# Patient Record
Sex: Male | Born: 1950
Health system: Southern US, Community
[De-identification: ages and names within clinical notes are randomized; demographics above are authoritative.]

## PROBLEM LIST (undated history)

## (undated) DIAGNOSIS — F329 Major depressive disorder, single episode, unspecified: Secondary | ICD-10-CM

## (undated) DIAGNOSIS — N02B9 Other recurrent and persistent immunoglobulin A nephropathy: Secondary | ICD-10-CM

## (undated) DIAGNOSIS — N201 Calculus of ureter: Secondary | ICD-10-CM

## (undated) DIAGNOSIS — G4730 Sleep apnea, unspecified: Secondary | ICD-10-CM

## (undated) DIAGNOSIS — G4733 Obstructive sleep apnea (adult) (pediatric): Secondary | ICD-10-CM

## (undated) DIAGNOSIS — N289 Disorder of kidney and ureter, unspecified: Secondary | ICD-10-CM

## (undated) DIAGNOSIS — I951 Orthostatic hypotension: Secondary | ICD-10-CM

## (undated) DIAGNOSIS — E785 Hyperlipidemia, unspecified: Secondary | ICD-10-CM

## (undated) DIAGNOSIS — M810 Age-related osteoporosis without current pathological fracture: Secondary | ICD-10-CM

## (undated) DIAGNOSIS — N028 Recurrent and persistent hematuria with other morphologic changes: Secondary | ICD-10-CM

## (undated) DIAGNOSIS — F419 Anxiety disorder, unspecified: Secondary | ICD-10-CM

## (undated) DIAGNOSIS — H269 Unspecified cataract: Secondary | ICD-10-CM

## (undated) DIAGNOSIS — IMO0002 Reserved for concepts with insufficient information to code with codable children: Secondary | ICD-10-CM

## (undated) DIAGNOSIS — C801 Malignant (primary) neoplasm, unspecified: Secondary | ICD-10-CM

## (undated) DIAGNOSIS — G4700 Insomnia, unspecified: Secondary | ICD-10-CM

## (undated) DIAGNOSIS — I77810 Thoracic aortic ectasia: Secondary | ICD-10-CM

## (undated) DIAGNOSIS — E039 Hypothyroidism, unspecified: Secondary | ICD-10-CM

## (undated) DIAGNOSIS — I519 Heart disease, unspecified: Secondary | ICD-10-CM

## (undated) DIAGNOSIS — D649 Anemia, unspecified: Secondary | ICD-10-CM

## (undated) DIAGNOSIS — L719 Rosacea, unspecified: Secondary | ICD-10-CM

## (undated) DIAGNOSIS — F32A Depression, unspecified: Secondary | ICD-10-CM

## (undated) DIAGNOSIS — Z5189 Encounter for other specified aftercare: Secondary | ICD-10-CM

## (undated) DIAGNOSIS — I493 Ventricular premature depolarization: Secondary | ICD-10-CM

## (undated) DIAGNOSIS — I2510 Atherosclerotic heart disease of native coronary artery without angina pectoris: Secondary | ICD-10-CM

## (undated) HISTORY — DX: Rosacea, unspecified: L71.9

## (undated) HISTORY — DX: Heart disease, unspecified: I51.9

## (undated) HISTORY — DX: Thoracic aortic ectasia: I77.810

## (undated) HISTORY — DX: Ventricular premature depolarization: I49.3

## (undated) HISTORY — PX: CARDIAC CATHETERIZATION: SHX172

## (undated) HISTORY — DX: Atherosclerotic heart disease of native coronary artery without angina pectoris: I25.10

## (undated) HISTORY — DX: Insomnia, unspecified: G47.00

## (undated) HISTORY — DX: Obstructive sleep apnea (adult) (pediatric): G47.33

## (undated) HISTORY — DX: Malignant (primary) neoplasm, unspecified: C80.1

## (undated) HISTORY — PX: OTHER SURGICAL HISTORY: SHX169

## (undated) HISTORY — DX: Unspecified cataract: H26.9

## (undated) HISTORY — DX: Age-related osteoporosis without current pathological fracture: M81.0

## (undated) HISTORY — DX: Anxiety disorder, unspecified: F41.9

## (undated) HISTORY — DX: Hyperlipidemia, unspecified: E78.5

## (undated) HISTORY — DX: Encounter for other specified aftercare: Z51.89

## (undated) HISTORY — DX: Sleep apnea, unspecified: G47.30

## (undated) HISTORY — DX: Orthostatic hypotension: I95.1

## (undated) HISTORY — DX: Reserved for concepts with insufficient information to code with codable children: IMO0002

## (undated) NOTE — *Deleted (*Deleted)
VIRTUAL VISIT Due to national recommendations of social distancing due to [place] 19, a virtual visit is felt to be most appropriate for this patient at this time.   I connected with the patient on 01/23/20 at  2:40 PM EDT by virtual telehealth platform and verified that I am speaking with the correct person using two identifiers.   I discussed the limitations, risks, security and privacy concerns of performing an evaluation and management service by  virtual telehealth platform and the availability of in person appointments. I also discussed with the patient that there may be a patient responsible charge related to this service. The patient expressed understanding and agreed to proceed.  Patient location: Home Provider Location: [place] Participants: [name] and [name]   Chief Complaint  Patient presents with  . Follow-up    Pt stated--feeling much better but still chest pain when takin deep breath.    History of Present Illness:  34 year old male presents for hospital follow up.  COVID 19 screen No recent travel or known exposure to COVID19 The patient denies respiratory symptoms of COVID 19 at this time.  The importance of social distancing was discussed today.   ROS    Past Medical History:  Diagnosis Date  . Anemia   . Anxiety   . Asymptomatic LV dysfunction    50-55% by echo 06/2016  . Berger's disease   . CAD (coronary artery disease) 06/2005   PCI of LAD and RCA  . Depression   . Dilated aortic root (HCC)    4cm at sinus of Valsalva  . Hematuria   . Hyperlipidemia   . Hypothyroidism   . Insomnia   . Left ureteral stone   . Orthostatic hypotension    negative tilt table  . OSA (obstructive sleep apnea)    moderate with AHI 17/hr, uses CPAP nightly(01/15/19 - has Inspire implant)  . PVC's (premature ventricular contractions) 07/07/2016  . Renal disorder    PT IS SEEING DR. Lorrene Reid- NEPHROLOGIST  . Rosacea   . Urticaria     reports that he  has never smoked. He has never used smokeless tobacco. He reports current alcohol use of about 1.0 standard drink of alcohol per week. He reports that he does not use drugs.   Current Outpatient Medications:  .  albuterol (VENTOLIN HFA) 108 (90 Base) MCG/ACT inhaler, Inhale 2 puffs into the lungs every 4 (four) hours as needed for wheezing or shortness of breath., Disp: 1 each, Rfl: 0 .  alendronate (FOSAMAX) 70 MG tablet, TAKE 1 TABLET BY MOUTH EVERY 7 DAYS WITH A FULL GLASS OF WATER ON AN EMPTY STOMACH. (Patient taking differently: Take 70 mg by mouth once a week. ), Disp: 12 tablet, Rfl: 3 .  ascorbic acid (VITAMIN C) 500 MG tablet, Take 1 tablet (500 mg total) by mouth daily., Disp: 30 tablet, Rfl: 0 .  atorvastatin (LIPITOR) 40 MG tablet, Take 40 mg by mouth daily., Disp: , Rfl:  .  busPIRone (BUSPAR) 10 MG tablet, TAKE 2 TABLETS (20 MG TOTAL) BY MOUTH 3 (THREE) TIMES DAILY., Disp: 540 tablet, Rfl: 0 .  clopidogrel (PLAVIX) 75 MG tablet, TAKE 1 TABLET DAILY, Disp: 90 tablet, Rfl: 2 .  ferrous sulfate 325 (65 FE) MG tablet, TAKE 1 TABLET BY MOUTH 3 TIMES DAILY WITH MEALS. (Patient taking differently: Take 325 mg by mouth 3 (three) times daily with meals. ), Disp: 270 tablet, Rfl: 3 .  FLUoxetine (PROZAC) 20 MG capsule,  TAKE 1 CAPSULE DAILY. TAKE WITH 40MG  TO EQUAL 60MG     ONCE DAILY (Patient taking differently: Take 20 mg by mouth daily. TAKE 1 CAPSULE DAILY. TAKE WITH 40MG  TO EQUAL 60MG     ONCE DAILY), Disp: 90 capsule, Rfl: 1 .  FLUoxetine (PROZAC) 40 MG capsule, Take 1 capsule (40 mg total) by mouth every morning., Disp: 90 capsule, Rfl: 1 .  furosemide (LASIX) 20 MG tablet, Take 1 tablet (20 mg total) by mouth as needed (As needed for ankle swelling)., Disp: 90 tablet, Rfl: 3 .  gabapentin (NEURONTIN) 100 MG capsule, Take 2 capsules (200 mg total) by mouth 2 (two) times daily., Disp: 120 capsule, Rfl: 1 .  levothyroxine (SYNTHROID) 75 MCG tablet, TAKE 1 TABLET (75 MCG TOTAL) BY MOUTH DAILY  BEFORE BREAKFAST., Disp: 90 tablet, Rfl: 1 .  midodrine (PROAMATINE) 10 MG tablet, Take 1 tablet (10 mg total) by mouth 3 (three) times daily., Disp: 270 tablet, Rfl: 3 .  mirtazapine (REMERON) 15 MG tablet, TAKE 1 TABLET BY MOUTH EVERYDAY AT BEDTIME (Patient taking differently: Take 15 mg by mouth at bedtime. ), Disp: 90 tablet, Rfl: 1 .  nitroGLYCERIN (NITROSTAT) 0.4 MG SL tablet, Place 1 tablet (0.4 mg total) under the tongue every 5 (five) minutes as needed for chest pain., Disp: 10 tablet, Rfl: 1 .  pantoprazole (PROTONIX) 40 MG tablet, TAKE 1 TABLET BY MOUTH TWICE A DAY (Patient taking differently: Take 40 mg by mouth 2 (two) times daily. ), Disp: 180 tablet, Rfl: 1 .  tamsulosin (FLOMAX) 0.4 MG CAPS capsule, Take 0.4 mg by mouth at bedtime., Disp: , Rfl:  .  triamcinolone cream (KENALOG) 0.1 %, APPLY 1 APPLICATION TOPICALLY 2 (TWO) TIMES DAILY. AVOID FACE, GROIN, UNDERARMS., Disp: 454 g, Rfl: 0 .  vitamin B-12 (CYANOCOBALAMIN) 1000 MCG tablet, Take 1,000 mcg by mouth daily. , Disp: , Rfl:  .  zinc sulfate 220 (50 Zn) MG capsule, Take 1 capsule (220 mg total) by mouth daily., Disp: 30 capsule, Rfl: 0 .  zolpidem (AMBIEN) 5 MG tablet, Take 5 mg by mouth at bedtime. , Disp: , Rfl:    Observations/Objective: Blood pressure 95/60, height 6' (1.829 m), weight 175 lb (79.4 kg).  Physical Exam   Assessment and Plan    I discussed the assessment and treatment plan with the patient. The patient was provided an opportunity to ask questions and all were answered. The patient agreed with the plan and demonstrated an understanding of the instructions.   The patient was advised to call back or seek an in-person evaluation if the symptoms worsen or if the condition fails to improve as anticipated.     [name], MD

---

## 2002-04-11 HISTORY — PX: CORONARY ANGIOPLASTY: SHX604

## 2002-06-12 ENCOUNTER — Encounter: Payer: Self-pay | Admitting: Family Medicine

## 2002-06-12 ENCOUNTER — Encounter: Admission: RE | Admit: 2002-06-12 | Discharge: 2002-06-12 | Payer: Self-pay | Admitting: Family Medicine

## 2002-08-09 ENCOUNTER — Ambulatory Visit (HOSPITAL_COMMUNITY): Admission: RE | Admit: 2002-08-09 | Discharge: 2002-08-09 | Payer: Self-pay | Admitting: Gastroenterology

## 2004-04-04 ENCOUNTER — Emergency Department (HOSPITAL_COMMUNITY): Admission: EM | Admit: 2004-04-04 | Discharge: 2004-04-04 | Payer: Self-pay | Admitting: Emergency Medicine

## 2004-12-31 ENCOUNTER — Ambulatory Visit (HOSPITAL_COMMUNITY): Admission: RE | Admit: 2004-12-31 | Discharge: 2004-12-31 | Payer: Self-pay | Admitting: Cardiology

## 2005-02-01 ENCOUNTER — Ambulatory Visit (HOSPITAL_COMMUNITY): Admission: RE | Admit: 2005-02-01 | Discharge: 2005-02-02 | Payer: Self-pay | Admitting: Cardiology

## 2005-02-07 ENCOUNTER — Encounter: Admission: RE | Admit: 2005-02-07 | Discharge: 2005-02-07 | Payer: Self-pay | Admitting: Cardiology

## 2005-02-24 ENCOUNTER — Encounter (HOSPITAL_COMMUNITY): Admission: RE | Admit: 2005-02-24 | Discharge: 2005-05-25 | Payer: Self-pay | Admitting: Cardiology

## 2005-03-23 ENCOUNTER — Encounter: Admission: RE | Admit: 2005-03-23 | Discharge: 2005-03-23 | Payer: Self-pay | Admitting: Family Medicine

## 2005-05-26 ENCOUNTER — Encounter (HOSPITAL_COMMUNITY): Admission: RE | Admit: 2005-05-26 | Discharge: 2005-08-24 | Payer: Self-pay | Admitting: Cardiology

## 2005-06-09 DIAGNOSIS — I2510 Atherosclerotic heart disease of native coronary artery without angina pectoris: Secondary | ICD-10-CM

## 2005-06-09 HISTORY — DX: Atherosclerotic heart disease of native coronary artery without angina pectoris: I25.10

## 2005-06-17 ENCOUNTER — Ambulatory Visit (HOSPITAL_COMMUNITY): Admission: RE | Admit: 2005-06-17 | Discharge: 2005-06-18 | Payer: Self-pay | Admitting: Cardiology

## 2006-02-02 ENCOUNTER — Ambulatory Visit: Payer: Self-pay | Admitting: Family Medicine

## 2006-03-08 ENCOUNTER — Ambulatory Visit: Payer: Self-pay | Admitting: Family Medicine

## 2006-04-26 ENCOUNTER — Ambulatory Visit: Payer: Self-pay | Admitting: Family Medicine

## 2006-04-26 LAB — CONVERTED CEMR LAB
ALT: 23 units/L (ref 0–40)
AST: 35 units/L (ref 0–37)
Cholesterol: 133 mg/dL (ref 0–200)
Glucose, Bld: 103 mg/dL — ABNORMAL HIGH (ref 70–99)
HDL: 61.2 mg/dL (ref >39.0)
LDL Cholesterol: 63 mg/dL (ref 0–99)
PSA: 0.48 ng/mL (ref 0.10–4.00)
Total CHOL/HDL Ratio: 2.2
Triglycerides: 45 mg/dL (ref 0–149)
VLDL: 9 mg/dL (ref 0–40)

## 2006-04-27 ENCOUNTER — Encounter (INDEPENDENT_AMBULATORY_CARE_PROVIDER_SITE_OTHER): Payer: Self-pay | Admitting: Family Medicine

## 2006-05-15 ENCOUNTER — Ambulatory Visit: Payer: Self-pay | Admitting: Family Medicine

## 2006-05-16 ENCOUNTER — Encounter (INDEPENDENT_AMBULATORY_CARE_PROVIDER_SITE_OTHER): Payer: Self-pay | Admitting: Family Medicine

## 2006-05-16 LAB — CONVERTED CEMR LAB
Bilirubin Urine: NEGATIVE
Ketones, ur: NEGATIVE mg/dL
Leukocytes, UA: NEGATIVE
Nitrite: NEGATIVE
Protein, ur: NEGATIVE mg/dL
Specific Gravity, Urine: 1.022 (ref 1.005–1.03)
Urine Glucose: NEGATIVE mg/dL
Urobilinogen, UA: 0.2 (ref 0.0–1.0)
WBC, UA: NONE SEEN cells/hpf (ref <3)
pH: 5.5 (ref 5.0–8.0)

## 2006-06-19 ENCOUNTER — Ambulatory Visit: Payer: Self-pay | Admitting: Family Medicine

## 2006-06-19 LAB — CONVERTED CEMR LAB
Bilirubin Urine: NEGATIVE
Hemoglobin, Urine: NEGATIVE
Ketones, ur: NEGATIVE mg/dL
Leukocytes, UA: NEGATIVE
Nitrite: NEGATIVE
Protein, ur: NEGATIVE mg/dL
Specific Gravity, Urine: 1.013 (ref 1.005–1.03)
Urine Glucose: NEGATIVE mg/dL
Urobilinogen, UA: 0.2 (ref 0.0–1.0)
pH: 6 (ref 5.0–8.0)

## 2006-07-05 DIAGNOSIS — K219 Gastro-esophageal reflux disease without esophagitis: Secondary | ICD-10-CM | POA: Insufficient documentation

## 2006-07-05 DIAGNOSIS — F411 Generalized anxiety disorder: Secondary | ICD-10-CM | POA: Insufficient documentation

## 2006-07-05 DIAGNOSIS — E785 Hyperlipidemia, unspecified: Secondary | ICD-10-CM | POA: Insufficient documentation

## 2006-07-05 DIAGNOSIS — I2510 Atherosclerotic heart disease of native coronary artery without angina pectoris: Secondary | ICD-10-CM | POA: Insufficient documentation

## 2006-07-17 ENCOUNTER — Ambulatory Visit: Payer: Self-pay | Admitting: Family Medicine

## 2006-07-17 LAB — CONVERTED CEMR LAB: Glucose, Bld: 109 mg/dL — ABNORMAL HIGH (ref 70–99)

## 2006-10-18 ENCOUNTER — Encounter (INDEPENDENT_AMBULATORY_CARE_PROVIDER_SITE_OTHER): Payer: Self-pay | Admitting: Family Medicine

## 2006-12-14 ENCOUNTER — Telehealth (INDEPENDENT_AMBULATORY_CARE_PROVIDER_SITE_OTHER): Payer: Self-pay | Admitting: *Deleted

## 2007-01-29 ENCOUNTER — Encounter (INDEPENDENT_AMBULATORY_CARE_PROVIDER_SITE_OTHER): Payer: Self-pay | Admitting: Family Medicine

## 2007-03-01 ENCOUNTER — Ambulatory Visit: Payer: Self-pay | Admitting: Family Medicine

## 2007-03-04 LAB — CONVERTED CEMR LAB
ALT: 26 units/L (ref 0–53)
AST: 31 units/L (ref 0–37)
Cholesterol: 140 mg/dL (ref 0–200)
Glucose, Bld: 84 mg/dL (ref 70–99)
HDL: 54.9 mg/dL (ref >39.0)
LDL Cholesterol: 73 mg/dL (ref 0–99)
Total CHOL/HDL Ratio: 2.6
Triglycerides: 63 mg/dL (ref 0–149)
VLDL: 13 mg/dL (ref 0–40)

## 2007-03-05 ENCOUNTER — Telehealth (INDEPENDENT_AMBULATORY_CARE_PROVIDER_SITE_OTHER): Payer: Self-pay | Admitting: *Deleted

## 2007-03-05 ENCOUNTER — Encounter (INDEPENDENT_AMBULATORY_CARE_PROVIDER_SITE_OTHER): Payer: Self-pay | Admitting: *Deleted

## 2007-06-05 ENCOUNTER — Ambulatory Visit: Payer: Self-pay | Admitting: Family Medicine

## 2007-06-07 ENCOUNTER — Telehealth (INDEPENDENT_AMBULATORY_CARE_PROVIDER_SITE_OTHER): Payer: Self-pay | Admitting: *Deleted

## 2007-06-07 ENCOUNTER — Encounter (INDEPENDENT_AMBULATORY_CARE_PROVIDER_SITE_OTHER): Payer: Self-pay | Admitting: *Deleted

## 2007-06-07 LAB — CONVERTED CEMR LAB
ALT: 16 units/L (ref 0–53)
AST: 25 units/L (ref 0–37)
Cholesterol: 150 mg/dL (ref 0–200)
Glucose, Bld: 108 mg/dL — ABNORMAL HIGH (ref 70–99)
HDL: 57.2 mg/dL (ref >39.0)
LDL Cholesterol: 79 mg/dL (ref 0–99)
PSA: 0.62 ng/mL (ref 0.10–4.00)
Total CHOL/HDL Ratio: 2.6
Triglycerides: 69 mg/dL (ref 0–149)
VLDL: 14 mg/dL (ref 0–40)

## 2007-08-01 ENCOUNTER — Encounter: Payer: Self-pay | Admitting: Internal Medicine

## 2007-08-01 LAB — HM COLONOSCOPY: HM Colonoscopy: NORMAL

## 2007-09-12 ENCOUNTER — Ambulatory Visit: Payer: Self-pay | Admitting: Family Medicine

## 2007-09-12 DIAGNOSIS — Z8669 Personal history of other diseases of the nervous system and sense organs: Secondary | ICD-10-CM | POA: Insufficient documentation

## 2007-12-14 ENCOUNTER — Ambulatory Visit: Payer: Self-pay | Admitting: Family Medicine

## 2008-01-09 ENCOUNTER — Ambulatory Visit: Payer: Self-pay | Admitting: Family Medicine

## 2008-03-21 ENCOUNTER — Telehealth: Payer: Self-pay | Admitting: Family Medicine

## 2008-06-18 ENCOUNTER — Ambulatory Visit: Payer: Self-pay | Admitting: Family Medicine

## 2008-06-19 LAB — CONVERTED CEMR LAB
ALT: 22 units/L (ref 0–53)
AST: 30 units/L (ref 0–37)
Albumin: 4 g/dL (ref 3.5–5.2)
Alkaline Phosphatase: 65 units/L (ref 39–117)
BUN: 12 mg/dL (ref 6–23)
Basophils Absolute: 0 10*3/uL (ref 0.0–0.1)
Basophils Relative: 0.1 % (ref 0.0–3.0)
Bilirubin, Direct: 0.1 mg/dL (ref 0.0–0.3)
CO2: 28 meq/L (ref 19–32)
Calcium: 9.6 mg/dL (ref 8.4–10.5)
Chloride: 107 meq/L (ref 96–112)
Cholesterol: 144 mg/dL (ref 0–200)
Creatinine, Ser: 1.3 mg/dL (ref 0.4–1.5)
Creatinine,U: 247.1 mg/dL
Eosinophils Absolute: 0.2 10*3/uL (ref 0.0–0.7)
Eosinophils Relative: 4.6 % (ref 0.0–5.0)
GFR calc Af Amer: 73 mL/min
GFR calc non Af Amer: 60 mL/min
Glucose, Bld: 110 mg/dL — ABNORMAL HIGH (ref 70–99)
HCT: 43.2 % (ref 39.0–52.0)
HDL: 69.2 mg/dL (ref >39.0)
Hemoglobin: 14.8 g/dL (ref 13.0–17.0)
Hgb A1c MFr Bld: 5.8 % (ref 4.6–6.0)
LDL Cholesterol: 68 mg/dL (ref 0–99)
Lymphocytes Relative: 39 % (ref 12.0–46.0)
MCHC: 34.3 g/dL (ref 30.0–36.0)
MCV: 92.9 fL (ref 78.0–100.0)
Microalb Creat Ratio: 2 mg/g (ref 0.0–30.0)
Microalb, Ur: 0.5 mg/dL (ref 0.0–1.9)
Monocytes Absolute: 0.5 10*3/uL (ref 0.1–1.0)
Monocytes Relative: 9.6 % (ref 3.0–12.0)
Neutro Abs: 2.3 10*3/uL (ref 1.4–7.7)
Neutrophils Relative %: 46.7 % (ref 43.0–77.0)
PSA: 0.43 ng/mL (ref 0.10–4.00)
Platelets: 213 10*3/uL (ref 150–400)
Potassium: 5.2 meq/L — ABNORMAL HIGH (ref 3.5–5.1)
RBC: 4.64 M/uL (ref 4.22–5.81)
RDW: 13.6 % (ref 11.5–14.6)
Sodium: 140 meq/L (ref 135–145)
TSH: 3.69 microintl units/mL (ref 0.35–5.50)
Total Bilirubin: 1 mg/dL (ref 0.3–1.2)
Total CHOL/HDL Ratio: 2.1
Total Protein: 6.4 g/dL (ref 6.0–8.3)
Triglycerides: 33 mg/dL (ref 0–149)
VLDL: 7 mg/dL (ref 0–40)
WBC: 4.9 10*3/uL (ref 4.5–10.5)

## 2008-06-24 ENCOUNTER — Ambulatory Visit: Payer: Self-pay | Admitting: Family Medicine

## 2008-06-24 DIAGNOSIS — R7309 Other abnormal glucose: Secondary | ICD-10-CM

## 2008-08-01 ENCOUNTER — Ambulatory Visit: Payer: Self-pay | Admitting: Family Medicine

## 2008-09-10 ENCOUNTER — Ambulatory Visit: Payer: Self-pay | Admitting: Family Medicine

## 2008-10-08 ENCOUNTER — Telehealth: Payer: Self-pay | Admitting: Family Medicine

## 2008-10-14 ENCOUNTER — Telehealth: Payer: Self-pay | Admitting: Family Medicine

## 2008-12-24 ENCOUNTER — Encounter: Payer: Self-pay | Admitting: Family Medicine

## 2009-01-29 ENCOUNTER — Telehealth: Payer: Self-pay | Admitting: Family Medicine

## 2009-06-18 ENCOUNTER — Encounter: Payer: Self-pay | Admitting: Family Medicine

## 2009-06-22 ENCOUNTER — Encounter (INDEPENDENT_AMBULATORY_CARE_PROVIDER_SITE_OTHER): Payer: Self-pay | Admitting: *Deleted

## 2009-06-22 LAB — CONVERTED CEMR LAB
ALT: 90 units/L
AST: 34 units/L
Alkaline Phosphatase: 60 units/L
Creatinine, Ser: 1.3 mg/dL
Direct LDL: 57 mg/dL
Glucose, Bld: 109 mg/dL
Potassium: 4.6 meq/L
Sodium: 142 meq/L
Triglycerides: 39 mg/dL

## 2009-06-24 ENCOUNTER — Ambulatory Visit: Payer: Self-pay | Admitting: Family Medicine

## 2009-09-18 ENCOUNTER — Ambulatory Visit: Payer: Self-pay | Admitting: Family Medicine

## 2009-09-18 LAB — CONVERTED CEMR LAB
ALT: 23 units/L (ref 0–53)
AST: 31 units/L (ref 0–37)
Albumin: 3.9 g/dL (ref 3.5–5.2)
Alkaline Phosphatase: 55 units/L (ref 39–117)
BUN: 24 mg/dL — ABNORMAL HIGH (ref 6–23)
Bilirubin, Direct: 0.1 mg/dL (ref 0.0–0.3)
CO2: 29 meq/L (ref 19–32)
Calcium: 8.9 mg/dL (ref 8.4–10.5)
Chloride: 106 meq/L (ref 96–112)
Cholesterol: 147 mg/dL (ref 0–200)
Creatinine, Ser: 1.1 mg/dL (ref 0.4–1.5)
GFR calc non Af Amer: 75.94 mL/min (ref >60)
Glucose, Bld: 96 mg/dL (ref 70–99)
HDL: 61.7 mg/dL (ref >39.00)
LDL Cholesterol: 83 mg/dL (ref 0–99)
PSA: 0.44 ng/mL (ref 0.10–4.00)
Potassium: 5 meq/L (ref 3.5–5.1)
Sodium: 141 meq/L (ref 135–145)
Total Bilirubin: 0.7 mg/dL (ref 0.3–1.2)
Total CHOL/HDL Ratio: 2
Total Protein: 5.7 g/dL — ABNORMAL LOW (ref 6.0–8.3)
Triglycerides: 10 mg/dL (ref 0.0–149.0)
VLDL: 2 mg/dL (ref 0.0–40.0)

## 2009-09-22 ENCOUNTER — Ambulatory Visit: Payer: Self-pay | Admitting: Family Medicine

## 2009-09-22 LAB — CONVERTED CEMR LAB
Cholesterol, target level: 200 mg/dL
HDL goal, serum: 40 mg/dL
LDL Goal: 70 mg/dL

## 2009-10-14 ENCOUNTER — Ambulatory Visit: Payer: Self-pay | Admitting: Family Medicine

## 2009-10-14 DIAGNOSIS — H6980 Other specified disorders of Eustachian tube, unspecified ear: Secondary | ICD-10-CM

## 2010-01-07 ENCOUNTER — Encounter (INDEPENDENT_AMBULATORY_CARE_PROVIDER_SITE_OTHER): Payer: Self-pay | Admitting: *Deleted

## 2010-01-21 ENCOUNTER — Ambulatory Visit: Payer: Self-pay | Admitting: Family Medicine

## 2010-01-21 DIAGNOSIS — R319 Hematuria, unspecified: Secondary | ICD-10-CM | POA: Insufficient documentation

## 2010-01-21 LAB — CONVERTED CEMR LAB
Bilirubin Urine: NEGATIVE
Glucose, Urine, Semiquant: NEGATIVE
Ketones, urine, test strip: NEGATIVE
Nitrite: NEGATIVE
Protein, U semiquant: 30
Specific Gravity, Urine: 1.02
Urobilinogen, UA: 0.2
pH: 6

## 2010-01-22 ENCOUNTER — Encounter: Payer: Self-pay | Admitting: Family Medicine

## 2010-01-25 LAB — CONVERTED CEMR LAB
Bacteria, UA: NONE SEEN
Casts: NONE SEEN /lpf
PSA: 0.53 ng/mL (ref 0.10–4.00)
RBC / HPF: >50 — AB (ref <3)
Squamous Epithelial / HPF: NONE SEEN /lpf

## 2010-02-03 ENCOUNTER — Encounter: Payer: Self-pay | Admitting: Family Medicine

## 2010-02-22 ENCOUNTER — Encounter: Payer: Self-pay | Admitting: Family Medicine

## 2010-03-29 ENCOUNTER — Encounter: Payer: Self-pay | Admitting: Family Medicine

## 2010-05-02 ENCOUNTER — Encounter: Payer: Self-pay | Admitting: Interventional Cardiology

## 2010-05-13 NOTE — Assessment & Plan Note (Signed)
Summary: PAIN  UNDER RIB CAGE/DLO   Vital Signs:  Patient profile:   60 year old male Weight:      204.75 pounds BMI:     27.87 Temp:     97.8 degrees F oral Pulse rate:   76 / minute Pulse rhythm:   regular BP sitting:   118 / 70  (left arm) Cuff size:   regular  Vitals Entered By: Emelia Salisbury LPN (March 16, 624THL [id number] PM) CC: Pain in left upper back X 1 1/2 weeks   History of Present Illness: Pain in posterior left lateral scapular area. He has had this for one week or more. He has taken IBP 2 tabs by mouth three times a day and used heat. He denies any unusual activity. He has had no breathing problems with this. He runs and walks a lot. Jogging hurts worse.  He denies inactivity. No fever or chills, no coiugh and no SOB. Laying on the floor and getting up hurts. Laying on stomach does not hurt. He has noticed no swelling or bruising. He has no sweating with it. Twisting the upper torso hurts to the left, end twist to the right hurts mildly.  Problems Prior to Update: 1)  Prediabetes  (ICD-790.29) 2)  Special Screening Malignant Neoplasm of Prostate  (ICD-V76.44) 3)  Cough  (ICD-786.2) 4)  Family History of Colon Ca 1st Degree Relative <60  (ICD-V16.0) 5)  Syncope, Hx of  (ICD-V12.49) 6)  Preventive Health Care  (ICD-V70.0) 7)  Screening For Diabetes Mellitus  (ICD-V77.1) 8)  Hyperlipidemia  (ICD-272.4) 9)  Gerd  (ICD-530.81) 10)  Coronary Artery Disease  (ICD-414.00) 11)  Anxiety  (ICD-300.00)  Medications Prior to Update: 1)  Plavix 75 Mg Tabs (Clopidogrel Bisulfate) .... Take 1 Tablet By Mouth Once A Day 2)  Vytorin 10-20 Mg Tabs (Ezetimibe-Simvastatin) .... Take 1 Tablet By Mouth Once A Day 3)  Doxepin Hcl 100 Mg Caps (Doxepin Hcl) .[name].. 1 Tab By Mouth By Mouth At Bedtime 4)  Hydroxyzine Hcl 25 Mg  Tabs (Hydroxyzine Hcl) .... Take 2 Tablets By Mouth At Bedtime 5)  Fexofenadine Hcl 180 Mg  Tabs (Fexofenadine Hcl) .... Take 1 Tablet By Mouth Once A Day 6)  Xyzal 5 Mg  Tabs  (Levocetirizine Dihydrochloride) .... Take 1 Tablet By Mouth Once A Day 7)  Aspirin 325 Mg  Tabs (Aspirin) .... Take 1 Tablet By Mouth Once A Day 8)  Effexor Xr 150 Mg Xr24h-Cap (Venlafaxine Hcl) .[name].. 1 Tab By Mouth Daily  Allergies: 1)  ! Codeine  Physical Exam  General:  Well-developed,well-nourished,in no acute distress; alert,appropriate and cooperative throughout examination, comfortable and nontoxic. Head:  Normocephalic and atraumatic without obvious abnormalities. No apparent alopecia or balding. Eyes:  Conjunctiva clear bilaterally.  Ears:  External ear exam shows no significant lesions or deformities.  Otoscopic examination reveals clear canals, tympanic membranes are intact bilaterally without bulging, retraction, inflammation or discharge. Hearing is grossly normal bilaterally. Nose:  External nasal examination shows no deformity or inflammation. Nasal mucosa are pink and moist without lesions or exudates. Mouth:  Oral mucosa and oropharynx without lesions or exudates.  Teeth in good repair. Neck:  no carotid bruit or thyromegaly  Lungs:  Normal respiratory effort, chest expands symmetrically. Lungs are clear to auscultation, no crackles or wheezes. No SOB, access muscle use or dyspnea. Heart:  Normal rate and regular rhythm. S1 and S2 normal without gallop, murmur, click, rub or other extra sounds. Msk:  Pain principally in the  left Rhomboid, L Upper Lat area. I cannot provoke pain w/ palpation but can with certain maneuvers, esp his own twisdting movements.   Impression & Recommendations:  Problem # 1:  BACK STRAIN, L RHOMBOID (V9791527.9) Assessment New See instructions. RTC if worsens. Stop both IBP and Flex as soon as able.  Complete Medication List: 1)  Plavix 75 Mg Tabs (Clopidogrel bisulfate) .... Take 1 tablet by mouth once a day 2)  Vytorin 10-20 Mg Tabs (Ezetimibe-simvastatin) .... Take 1 tablet by mouth once a day 3)  Doxepin Hcl 100 Mg Caps (Doxepin hcl) .[name].. 1  tab by mouth by mouth at bedtime 4)  Hydroxyzine Hcl 25 Mg Tabs (Hydroxyzine hcl) .... Take 2 tablets by mouth at bedtime 5)  Fexofenadine Hcl 180 Mg Tabs (Fexofenadine hcl) .... Take 1 tablet by mouth once a day 6)  Xyzal 5 Mg Tabs (Levocetirizine dihydrochloride) .... Take 1 tablet by mouth once a day 7)  Aspirin 325 Mg Tabs (Aspirin) .... Take 1 tablet by mouth once a day 8)  Effexor Xr 150 Mg Xr24h-cap (Venlafaxine hcl) .[name].. 1 tab by mouth daily 9)  Ibuprofen 200 Mg Tabs (Ibuprofen) .... As needed 10)  Ibuprofen 800 Mg Tabs (Ibuprofen) .... Oine tab after each meal. 11)  Cyclobenzaprine Hcl 10 Mg Tabs (Cyclobenzaprine hcl) .... One tab by mouth three times a day  Patient Instructions: 1)  Take IBP three times a day with food. 2)  Take Flexeril for muscle relaxation three times a day. 3)  Heat and ice as directed.  Prescriptions: CYCLOBENZAPRINE HCL 10 MG TABS (CYCLOBENZAPRINE HCL) one tab by mouth three times a day  #50 x 0   Entered and Authorized by:   Raenette Rover MD   Signed by:   Raenette Rover MD on 06/24/2009   Method used:   Electronically to        [id number] (retail)       [place], [place]  [address]       Ph: [phone]       Fax: [phone]   RxID:   [id number] IBUPROFEN 800 MG TABS (IBUPROFEN) oine tab after each meal.  #90 x 0   Entered and Authorized by:   Raenette Rover MD   Signed by:   Raenette Rover MD on 06/24/2009   Method used:   Electronically to        [id number]* (retail)       [place], [place]  [address]       Ph: [phone]       Fax: [phone]   RxID:   [id number]   Current Allergies (reviewed today): ! CODEINE

## 2010-05-13 NOTE — Consult Note (Signed)
Summary: [facility]  [facility]   Imported By: Bubba Hales 03/03/2010 12:29:31  _____________________________________________________________________  External Attachment:    Type:   Image     Comment:   External Document  Appended Document: [facility]    Clinical Lists Changes

## 2010-05-13 NOTE — Assessment & Plan Note (Signed)
Summary: blood in urine/alc   Vital Signs:  Patient profile:   60 year old male Height:      72 inches Weight:      186.75 pounds BMI:     25.42 Temp:     98.2 degrees F oral Pulse rate:   76 / minute Pulse rhythm:   regular BP sitting:   120 / 82 Cuff size:   regular  Vitals Entered ByLacretia Nicks (January 21, 2010 11:25 AM) CC: blood in urine    History of Present Illness: "Blood" = cola colored- in urine for last 3-4 weeks.  Noted in AM.  Exercising in AM.  Doesn't see change before exercise.  Normal urine before exercise.  Change in urine gradually gets better as the day goes one.  On ASA and plavix.  PSA wnl in 6/11.  No pain, discharge, burning.  Never smoked.  Jogging 36 miles a week on treadmill.   Allergies: 1)  ! Codeine  Past History:  Past Medical History: Last updated: 09/12/2007 Anxiety Coronary artery disease GERD Hyperlipidemia-272.4 Urticaria Rosacea Insomnia  DERM Dr. Rosaria Ferries CARDS  Dr. Radford Pax  Social History: Last updated: 09/12/2007 Occupation: retired, [employer] Married  Alcohol use-yes, 2 drinks a week Drug use-no Regular exercise-yes, jog 5 miles 6 days a week Never Smoked  Review of Systems       See HPI.  Otherwise negative.    Physical Exam  General:  GEN: nad, alert and oriented HEENT: mucous membranes moist CV: rrr.  PULM: ctab, no inc wob ABD: soft, +bs EXT: no edema SKIN: no acute rash  Prostate not tender to palpation and w/o nodularity   Impression & Recommendations:  Problem # 1:  HEMATURIA UNSPECIFIED (ICD-599.70) Likely runner's hematuria but given the new onset, I would like uro input for consideration of cystoscopy.  I told patient to cont the ASA/plavix in the meantime.  See instrutions.  Will contact with labs.  Orders: Specimen Handling ([id number]) T-Culture, Urine [id number]) T-Urine Microscopic [id number]) Urology Referral (Urology) TLB-PSA (Prostate Specific Antigen) (84153-PSA)  Complete  Medication List: 1)  Plavix 75 Mg Tabs (Clopidogrel bisulfate) .... Take 1 tablet by mouth once a day 2)  Simvastatin 80 Mg Tabs (Simvastatin) .[name].. 1 tab by mouth daily 3)  Doxepin Hcl 100 Mg Caps (Doxepin hcl) .[name].. 1 tab by mouth by mouth at bedtime 4)  Hydroxyzine Hcl 25 Mg Tabs (Hydroxyzine hcl) .... Take 2 tablets by mouth at bedtime 5)  Fexofenadine Hcl 180 Mg Tabs (Fexofenadine hcl) .... Take 1 tablet by mouth once a day 6)  Xyzal 5 Mg Tabs (Levocetirizine dihydrochloride) .... Take 1 tablet by mouth once a day 7)  Aspirin 325 Mg Tabs (Aspirin) .... Take 1 tablet by mouth once a day 8)  Ibuprofen 200 Mg Tabs (Ibuprofen) .... As needed 9)  Ibuprofen 800 Mg Tabs (Ibuprofen) .... Oine tab after each meal. 10)  Cyclobenzaprine Hcl 10 Mg Tabs (Cyclobenzaprine hcl) .... One tab by mouth three times a day 11)  Buspirone Hcl 15 Mg Tabs (Buspirone hcl) .[name].. 1 tab by mouth three times a day 12)  Fluticasone Propionate 50 Mcg/act Susp (Fluticasone propionate) .... 2 sprays per nostril daily  Patient Instructions: 1)  Don't change your meds for now.  See if you symptoms happen on the days when you don't run.  See Rosaria Ferries about your referral before your leave today.   Take care.   Current Allergies (reviewed today): ! CODEINE Laboratory Results   Urine Tests  Date/Time Received: January 21, 2010 11:28 AM Date/Time Reported: January 21, 2010 11:28 AM  Routine Urinalysis   Color: red Appearance: Hazy Glucose: negative   (Normal Range: Negative) Bilirubin: negative   (Normal Range: Negative) Ketone: negative   (Normal Range: Negative) Spec. Gravity: 1.020   (Normal Range: 1.003-1.035) Blood: large   (Normal Range: Negative) pH: 6.0   (Normal Range: 5.0-8.0) Protein: 30   (Normal Range: Negative) Urobilinogen: 0.2   (Normal Range: 0-1) Nitrite: negative   (Normal Range: Negative) Leukocyte Esterace: small   (Normal Range: Negative)

## 2010-05-13 NOTE — Miscellaneous (Signed)
  Clinical Lists Changes  Observations: Added new observation of TRIGLYC TOT: 39 mg/dL (06/22/2009 12:28) Added new observation of LDL DIR: 57 mg/dL (06/22/2009 12:28) Added new observation of ALK PHOS: 60 units/L (06/22/2009 12:28) Added new observation of SGPT (ALT): 90 units/L (06/22/2009 12:28) Added new observation of SGOT (AST): 34 units/L (06/22/2009 12:28) Added new observation of GLUCOSE SER: 109 mg/dL (06/22/2009 12:28) Added new observation of CREATININE: 1.30 mg/dL (06/22/2009 12:28) Added new observation of K SERUM: 4.6 meq/L (06/22/2009 12:28) Added new observation of NA: 142 meq/L (06/22/2009 12:28)

## 2010-05-13 NOTE — Consult Note (Signed)
Summary: [facility]  [facility]   Imported By: Laural Benes 02/10/2010 13:29:42  _____________________________________________________________________  External Attachment:    Type:   Image     Comment:   External Document  Appended Document: [facility]    Clinical Lists Changes  Observations: Added new observation of PAST MED HX: Anxiety Coronary artery disease GERD Hyperlipidemia-272.4 Urticaria Rosacea Insomnia hematuria eval 2011 per Dr. Mayra Neer Dr. Rosaria Ferries CARDS  Dr. Radford Pax (02/12/2010 13:37)       Past History:  Past Medical History: Anxiety Coronary artery disease GERD Hyperlipidemia-272.4 Urticaria Rosacea Insomnia hematuria eval 2011 per Dr. Mayra Neer Dr. Rosaria Ferries CARDS  Dr. Radford Pax

## 2010-05-13 NOTE — Consult Note (Signed)
Summary: [facility]  [facility]   Imported By: Edmonia James 04/07/2010 15:03:44  _____________________________________________________________________  External Attachment:    Type:   Image     Comment:   External Document

## 2010-05-13 NOTE — Miscellaneous (Signed)
  Clinical Lists Changes  Observations: Added new observation of FLU VAX: Historical (12/27/2009 11:30)      Immunization History:  Influenza Immunization History:    Influenza:  historical (12/27/2009)

## 2010-05-13 NOTE — Assessment & Plan Note (Signed)
Summary: LEFT EAR/CLE   Vital Signs:  Patient profile:   60 year old male Height:      72 inches Weight:      190.8 pounds BMI:     25.97 Temp:     97.8 degrees F oral Pulse rate:   76 / minute Pulse rhythm:   regular BP sitting:   110 / 70  (left arm) Cuff size:   regular  Vitals Entered By: Zenda Alpers CMA Deborra Medina) (October 14, 2009 10:10 AM)  History of Present Illness: Chief complaint left ear  Acute Visit History:      These symptoms began 1 week ago.  He denies cough, earache, eye symptoms, fever, nasal discharge, rash, sinus problems, and sore throat.  Other comments include: pressure muffled in left ear  tried swimmer ear drops.  No pain. .        Problems Prior to Update: 1)  Prediabetes  (ICD-790.29) 2)  Special Screening Malignant Neoplasm of Prostate  (ICD-V76.44) 3)  Family History of Colon Ca 1st Degree Relative <60  (ICD-V16.0) 4)  Syncope, Hx of  (ICD-V12.49) 5)  Preventive Health Care  (ICD-V70.0) 6)  Screening For Diabetes Mellitus  (ICD-V77.1) 7)  Hyperlipidemia  (ICD-272.4) 8)  Gerd  (ICD-530.81) 9)  Coronary Artery Disease  (ICD-414.00) 10)  Anxiety  (ICD-300.00)  Current Medications (verified): 1)  Plavix 75 Mg Tabs (Clopidogrel Bisulfate) .... Take 1 Tablet By Mouth Once A Day 2)  Simvastatin 80 Mg Tabs (Simvastatin) .[name].. 1 Tab By Mouth Daily 3)  Doxepin Hcl 100 Mg Caps (Doxepin Hcl) .[name].. 1 Tab By Mouth By Mouth At Bedtime 4)  Hydroxyzine Hcl 25 Mg  Tabs (Hydroxyzine Hcl) .... Take 2 Tablets By Mouth At Bedtime 5)  Fexofenadine Hcl 180 Mg  Tabs (Fexofenadine Hcl) .... Take 1 Tablet By Mouth Once A Day 6)  Xyzal 5 Mg  Tabs (Levocetirizine Dihydrochloride) .... Take 1 Tablet By Mouth Once A Day 7)  Aspirin 325 Mg  Tabs (Aspirin) .... Take 1 Tablet By Mouth Once A Day 8)  Ibuprofen 200 Mg Tabs (Ibuprofen) .... As Needed 9)  Ibuprofen 800 Mg Tabs (Ibuprofen) .... Oine Tab After Each Meal. 10)  Cyclobenzaprine Hcl 10 Mg Tabs (Cyclobenzaprine Hcl) ....  One Tab By Mouth Three Times A Day 11)  Buspirone Hcl 7.5 Mg Tabs (Buspirone Hcl) .[name].. 1 Tab Po  Two Times A Day, Increase Every 2-3 Days Up To 15 Mg Twice Daily  Allergies: 1)  ! Codeine  Past History:  Past medical, surgical, family and social histories (including risk factors) reviewed, and no changes noted (except as noted below).  Past Medical History: Reviewed history from 09/12/2007 and no changes required. Anxiety Coronary artery disease GERD Hyperlipidemia-272.4 Urticaria Rosacea Insomnia  DERM Dr. Rosaria Ferries CARDS  Dr. Radford Pax  Past Surgical History: Reviewed history from 09/12/2007 and no changes required. CAD-stent x2 2006 2009 4th digit toe surgery for old fracture  Family History: Reviewed history from 09/12/2007 and no changes required. CAD in mother lung cancer and colon cancer: father brother: DM Family History of Colon CA 1st degree relative <60  Social History: Reviewed history from 09/12/2007 and no changes required. Occupation: retired, Print production planner Married  Alcohol use-yes, 2 drinks a week Drug use-no Regular exercise-yes, jog 5 miles 6 days a week Never Smoked  Review of Systems General:  Denies fatigue and fever. CV:  Denies chest pain or discomfort. Resp:  Denies shortness of breath. GI:  Denies abdominal pain. GU:  Denies dysuria.  Physical Exam  General:  Well-developed,well-nourished,in no acute distress; alert,appropriate and cooperative throughout examination Head:  no maxillary sinus ttp Eyes:  No corneal or conjunctival inflammation noted. EOMI. Perrla. Funduscopic exam benign, without hemorrhages, exudates or papilledema. Vision grossly normal. Ears:  External ear exam shows no significant lesions or deformities.  Otoscopic examination reveals clear canals, tympanic membranes are intact bilaterally without bulging, retraction, inflammation or discharge. Hearing is grossly normal bilaterally. Nose:  External nasal examination shows  no deformity or inflammation. Nasal mucosa are pink and moist without lesions or exudates. Mouth:  Oral mucosa and oropharynx without lesions or exudates.  Teeth in good repair. Neck:  no carotid bruit or thyromegaly no cervical or supraclavicular lymphadenopathy  Lungs:  Normal respiratory effort, chest expands symmetrically. Lungs are clear to auscultation, no crackles or wheezes. Heart:  Normal rate and regular rhythm. S1 and S2 normal without gallop, murmur, click, rub or other extra sounds. Psych:  Cognition and judgment appear intact. Alert and cooperative with normal attention span and concentration. No apparent delusions, illusions, hallucinations   Impression & Recommendations:  Problem # 1:  EUSTACHIAN TUBE DYSFUNCTION, LEFT (ICD-381.81) Treat with nasal steroid spray. No evidence of infection or cerumen. Follow up if not improving.   Problem # 2:  ANXIETY (ICD-300.00) Improved but not at goal... med wears off mid day..increase to three times a day dosing. No SI.  His updated medication list for this problem includes:    Doxepin Hcl 100 Mg Caps (Doxepin hcl) .[name]... 1 tab by mouth by mouth at bedtime    Hydroxyzine Hcl 25 Mg Tabs (Hydroxyzine hcl) .[name]... Take 2 tablets by mouth at bedtime    Buspirone Hcl 15 Mg Tabs (Buspirone hcl) .[name]... 1 tab by mouth three times a day  Complete Medication List: 1)  Plavix 75 Mg Tabs (Clopidogrel bisulfate) .... Take 1 tablet by mouth once a day 2)  Simvastatin 80 Mg Tabs (Simvastatin) .[name].. 1 tab by mouth daily 3)  Doxepin Hcl 100 Mg Caps (Doxepin hcl) .[name].. 1 tab by mouth by mouth at bedtime 4)  Hydroxyzine Hcl 25 Mg Tabs (Hydroxyzine hcl) .... Take 2 tablets by mouth at bedtime 5)  Fexofenadine Hcl 180 Mg Tabs (Fexofenadine hcl) .... Take 1 tablet by mouth once a day 6)  Xyzal 5 Mg Tabs (Levocetirizine dihydrochloride) .... Take 1 tablet by mouth once a day 7)  Aspirin 325 Mg Tabs (Aspirin) .... Take 1 tablet by mouth once a day 8)  Ibuprofen 200  Mg Tabs (Ibuprofen) .... As needed 9)  Ibuprofen 800 Mg Tabs (Ibuprofen) .... Oine tab after each meal. 10)  Cyclobenzaprine Hcl 10 Mg Tabs (Cyclobenzaprine hcl) .... One tab by mouth three times a day 11)  Buspirone Hcl 15 Mg Tabs (Buspirone hcl) .[name].. 1 tab by mouth three times a day 12)  Fluticasone Propionate 50 Mcg/act Susp (Fluticasone propionate) .... 2 sprays per nostril daily  Patient Instructions: 1)  Keep appt in week to follow up if ear not improved. 2)  Start with nasal steroid spray daily. 3)  Increase to three times a day buspar 15 mg.  Prescriptions: FLUTICASONE PROPIONATE 50 MCG/ACT SUSP (FLUTICASONE PROPIONATE) 2 sprays per nostril daily  #1 x 0   Entered and Authorized by:   [name] MD   Signed by:   [name] MD on 10/14/2009   Method used:   Electronically to        [id number]* (retail)  [place], [place]  [address]       Ph: [phone]       Fax: [phone]   RxID:   [id number] BUSPIRONE HCL 15 MG TABS (BUSPIRONE HCL) 1 tab by mouth three times a day  #90 x 3   Entered and Authorized by:   [name] MD   Signed by:   [name] MD on 10/14/2009   Method used:   Electronically to        [id number]* (retail)       [place], [place]  [address]       Ph: [phone]       Fax: [phone]   RxID:   [id number]   Current Allergies (reviewed today): ! CODEINE

## 2010-05-13 NOTE — Assessment & Plan Note (Signed)
Summary: CPX/CLE   Vital Signs:  Patient profile:   60 year old male Height:      72 inches Weight:      189.4 pounds BMI:     25.78 Temp:     97.7 degrees F oral Pulse rate:   76 / minute Pulse rhythm:   regular BP sitting:   104 / 70  (left arm) Cuff size:   regular  Vitals Entered By: Zenda Alpers CMA Deborra Medina) (September 22, 2009 3:09 PM)  History of Present Illness: Chief complaint cpx  Anxiety: inadequate control...continues to feel anxious , shaky.Marland Kitchendenies depression.  Sleeping well at night with doxepin.  On high dose of effexor 150 mg daily...no improvement at all.  Zoloft has not helped in the past.  TSH in last year nml.  No BP issues.   Never tried Buspar.   CAD...Dr. Radford Pax..stable per cards. On plavix.   Working onlifestyle change..has lost 15 lbs.   Lipid Management History:      Positive NCEP/ATP III risk factors include male age 55 years old or older and ASHD (either angina/prior MI/prior CABG).  Negative NCEP/ATP III risk factors include HDL cholesterol greater than 60 and non-tobacco-user status.        Adjunctive measures started by the patient include aerobic exercise, folic acid, omega-3 supplements, and limit alcohol consumpton.  He expresses no side effects from his lipid-lowering medication.  The patient denies any symptoms to suggest myopathy or liver disease.  Comments: per pt cardiologist is adjusting med...is current on simvastatin 80 mg .Marland KitchenLDL 06/2009 was 57 Walking 5 miles per day.   Problems Prior to Update: 1)  Prediabetes  (ICD-790.29) 2)  Special Screening Malignant Neoplasm of Prostate  (ICD-V76.44) 3)  Cough  (ICD-786.2) 4)  Family History of Colon Ca 1st Degree Relative <60  (ICD-V16.0) 5)  Syncope, Hx of  (ICD-V12.49) 6)  Preventive Health Care  (ICD-V70.0) 7)  Screening For Diabetes Mellitus  (ICD-V77.1) 8)  Hyperlipidemia  (ICD-272.4) 9)  Gerd  (ICD-530.81) 10)  Coronary Artery Disease  (ICD-414.00) 11)  Anxiety   (ICD-300.00)  Current Medications (verified): 1)  Plavix 75 Mg Tabs (Clopidogrel Bisulfate) .... Take 1 Tablet By Mouth Once A Day 2)  Simvastatin 80 Mg Tabs (Simvastatin) .[name].. 1 Tab By Mouth Daily 3)  Doxepin Hcl 100 Mg Caps (Doxepin Hcl) .[name].. 1 Tab By Mouth By Mouth At Bedtime 4)  Hydroxyzine Hcl 25 Mg  Tabs (Hydroxyzine Hcl) .... Take 2 Tablets By Mouth At Bedtime 5)  Fexofenadine Hcl 180 Mg  Tabs (Fexofenadine Hcl) .... Take 1 Tablet By Mouth Once A Day 6)  Xyzal 5 Mg  Tabs (Levocetirizine Dihydrochloride) .... Take 1 Tablet By Mouth Once A Day 7)  Aspirin 325 Mg  Tabs (Aspirin) .... Take 1 Tablet By Mouth Once A Day 8)  Ibuprofen 200 Mg Tabs (Ibuprofen) .... As Needed 9)  Ibuprofen 800 Mg Tabs (Ibuprofen) .... Oine Tab After Each Meal. 10)  Cyclobenzaprine Hcl 10 Mg Tabs (Cyclobenzaprine Hcl) .... One Tab By Mouth Three Times A Day 11)  Buspirone Hcl 7.5 Mg Tabs (Buspirone Hcl) .[name].. 1 Tab Po  Two Times A Day, Increase Every 2-3 Days Up To 15 Mg Twice Daily  Allergies: 1)  ! Codeine  Past History:  Past medical, surgical, family and social histories (including risk factors) reviewed, and no changes noted (except as noted below).  Past Medical History: Reviewed history from 09/12/2007 and no changes required. Anxiety Coronary artery disease GERD Hyperlipidemia-272.4 Urticaria Rosacea  Insomnia  DERM Dr. Rosaria Ferries CARDS  Dr. Radford Pax  Past Surgical History: Reviewed history from 09/12/2007 and no changes required. CAD-stent x2 2006 2009 4th digit toe surgery for old fracture  Family History: Reviewed history from 09/12/2007 and no changes required. CAD in mother lung cancer and colon cancer: father brother: DM Family History of Colon CA 1st degree relative <60  Social History: Reviewed history from 09/12/2007 and no changes required. Occupation: retired, Print production planner Married  Alcohol use-yes, 2 drinks a week Drug use-no Regular exercise-yes, jog 5 miles 6 days a  week Never Smoked  Review of Systems General:  Complains of fatigue; denies fever. CV:  Denies chest pain or discomfort. Resp:  Denies shortness of breath, sputum productive, and wheezing. GI:  Denies abdominal pain, bloody stools, constipation, and diarrhea. GU:  Denies dysuria. Derm:  Denies lesion(s) and rash. Psych:  Complains of anxiety; denies depression, easily angered, mental problems, panic attacks, suicidal thoughts/plans, unusual visions or sounds, and thoughts /plans of harming others.  Physical Exam  General:  Well-developed,well-nourished,in no acute distress; alert,appropriate and cooperative throughout examination Ears:  External ear exam shows no significant lesions or deformities.  Otoscopic examination reveals clear canals, tympanic membranes are intact bilaterally without bulging, retraction, inflammation or discharge. Hearing is grossly normal bilaterally. Nose:  External nasal examination shows no deformity or inflammation. Nasal mucosa are pink and moist without lesions or exudates. Mouth:  Oral mucosa and oropharynx without lesions or exudates.  Teeth in good repair. Neck:   no cervical or supraclavicular lymphadenopathy no carotid bruit or thyromegaly  Lungs:  Normal respiratory effort, chest expands symmetrically. Lungs are clear to auscultation, no crackles or wheezes. Heart:  Normal rate and regular rhythm. S1 and S2 normal without gallop, murmur, click, rub or other extra sounds. Abdomen:  Bowel sounds positive,abdomen soft and non-tender without masses, organomegaly or hernias noted. Rectal:  No external abnormalities noted. Normal sphincter tone. No rectal masses or tenderness. Genitalia:  Testes bilaterally descended without nodularity, tenderness or masses. No scrotal masses or lesions. No penis lesions or urethral discharge. Prostate:  Prostate gland firm and smooth, no enlargement, nodularity, tenderness, mass, asymmetry or induration. Msk:  No deformity  or scoliosis noted of thoracic or lumbar spine.   Pulses:  R and L posterior tibial pulses are full and equal bilaterally  Extremities:  no edema Neurologic:  No cranial nerve deficits noted. Station and gait are normal. Plantar reflexes are down-going bilaterally. DTRs are symmetrical throughout. Sensory, motor and coordinative functions appear intact. Skin:  Intact without suspicious lesions or rashes   Impression & Recommendations:  Problem # 1:  PREVENTIVE HEALTH CARE (ICD-V70.0) The patient's preventative maintenance and recommended screening tests for an annual wellness exam were reviewed in full today. Brought up to date unless services declined.  Counselled on the importance of diet, exercise, and its role in overall health and mortality. The patient's FH and SH was reviewed, including their home life, tobacco status, and drug and alcohol status.     Problem # 2:  HYPERLIPIDEMIA (B2193296.4) Asymptomatic on siumvastian high dose..has been on greater than 1 year.  Changes per cards, but recommend considering lipitor when generic to get LDL below 70.  His updated medication list for this problem includes:    Simvastatin 80 Mg Tabs (Simvastatin) .[name]... 1 tab by mouth daily  Problem # 3:  CORONARY ARTERY DISEASE (ICD-414.00) Stable per cards.  His updated medication list for this problem includes:    Plavix 75 Mg Tabs (  Clopidogrel bisulfate) .[name]... Take 1 tablet by mouth once a day    Aspirin 325 Mg Tabs (Aspirin) .[name]... Take 1 tablet by mouth once a day  Problem # 4:  ANXIETY (ICD-300.00) Poor control. Wean off effexor..then cahnge to buspar. likely will need to titrate up to max dose. Follow up in 40month. If poor control refer to psychiatry for further recommendations.  The following medications were removed from the medication list:    Effexor Xr 150 Mg Xr24h-cap (Venlafaxine hcl) .[name]... 1 tab by mouth daily His updated medication list for this problem includes:    Doxepin Hcl 100  Mg Caps (Doxepin hcl) .[name]... 1 tab by mouth by mouth at bedtime    Hydroxyzine Hcl 25 Mg Tabs (Hydroxyzine hcl) .[name]... Take 2 tablets by mouth at bedtime    Buspirone Hcl 7.5 Mg Tabs (Buspirone hcl) .[name]... 1 tab po  two times a day, increase every 2-3 days up to 15 mg twice daily  Complete Medication List: 1)  Plavix 75 Mg Tabs (Clopidogrel bisulfate) .... Take 1 tablet by mouth once a day 2)  Simvastatin 80 Mg Tabs (Simvastatin) .[name].. 1 tab by mouth daily 3)  Doxepin Hcl 100 Mg Caps (Doxepin hcl) .[name].. 1 tab by mouth by mouth at bedtime 4)  Hydroxyzine Hcl 25 Mg Tabs (Hydroxyzine hcl) .... Take 2 tablets by mouth at bedtime 5)  Fexofenadine Hcl 180 Mg Tabs (Fexofenadine hcl) .... Take 1 tablet by mouth once a day 6)  Xyzal 5 Mg Tabs (Levocetirizine dihydrochloride) .... Take 1 tablet by mouth once a day 7)  Aspirin 325 Mg Tabs (Aspirin) .... Take 1 tablet by mouth once a day 8)  Ibuprofen 200 Mg Tabs (Ibuprofen) .... As needed 9)  Ibuprofen 800 Mg Tabs (Ibuprofen) .... Oine tab after each meal. 10)  Cyclobenzaprine Hcl 10 Mg Tabs (Cyclobenzaprine hcl) .... One tab by mouth three times a day 11)  Buspirone Hcl 7.5 Mg Tabs (Buspirone hcl) .[name].. 1 tab po  two times a day, increase every 2-3 days up to 15 mg twice daily  Lipid Assessment/Plan:      Based on NCEP/ATP III, the patient's risk factor category is "history of coronary disease, peripheral vascular disease, cerebrovascular disease, or aortic aneurysm".  The patient's lipid goals are as follows: Total cholesterol goal is 200; LDL cholesterol goal is 70; HDL cholesterol goal is 40; Triglyceride goal is 150.  His LDL cholesterol goal has not been met.    Patient Instructions: 1)  Decrease effexor to 75 mg daily .x 1 week then 75 mg every other day x 1 week, then stop 2)   After on lower dose effeor x 1 week..may start buspar. 3)  Please schedule a follow-up appointment in 1 month mood.  Prescriptions: BUSPIRONE HCL 7.5 MG TABS (BUSPIRONE HCL) 1  tab po  two times a day, increase every 2-3 days up to 15 mg twice daily  #120 x 3   Entered and Authorized by:   [name] MD   Signed by:   [name] MD on 09/22/2009   Method used:   Electronically to        [id number]* (retail)       [place], [place]  [address]       Ph: [phone]       Fax: [phone]   RxID:   [id number]   Current Allergies (reviewed today): ! CODEINE

## 2010-06-17 ENCOUNTER — Ambulatory Visit (INDEPENDENT_AMBULATORY_CARE_PROVIDER_SITE_OTHER)
Admission: RE | Admit: 2010-06-17 | Discharge: 2010-06-17 | Disposition: A | Discharge status: Home / Self Care | Payer: 59 | Source: Ambulatory Visit | Attending: Family Medicine | Admitting: Family Medicine

## 2010-06-17 ENCOUNTER — Encounter: Payer: Self-pay | Admitting: Family Medicine

## 2010-06-17 ENCOUNTER — Other Ambulatory Visit: Payer: Self-pay | Admitting: Family Medicine

## 2010-06-17 ENCOUNTER — Ambulatory Visit (INDEPENDENT_AMBULATORY_CARE_PROVIDER_SITE_OTHER): Payer: 59 | Admitting: Family Medicine

## 2010-06-17 DIAGNOSIS — R079 Chest pain, unspecified: Secondary | ICD-10-CM

## 2010-06-17 DIAGNOSIS — M79609 Pain in unspecified limb: Secondary | ICD-10-CM
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
## 2010-07-07 ENCOUNTER — Other Ambulatory Visit: Payer: Self-pay | Admitting: Family Medicine

## 2010-08-09 ENCOUNTER — Other Ambulatory Visit: Payer: Self-pay | Admitting: *Deleted

## 2010-08-09 MED ORDER — DOXEPIN HCL 50 MG PO CAPS: 50.0000 mg | ORAL_CAPSULE | Freq: Every day | ORAL | Status: DC

## 2010-08-11 ENCOUNTER — Telehealth: Payer: Self-pay | Admitting: *Deleted

## 2010-08-11 NOTE — Telephone Encounter (Signed)
Pt called on Monday and asked that his doxepin script be changed to 90 days, number 90 were sent in, but pt takes two 50 mg's a day, so he needed 180.  He is asking that we send in a new script for 180 to [facility], letting them know that we made an error in the dosing and quantity.

## 2010-08-12 NOTE — Telephone Encounter (Signed)
Please assist in making this change

## 2010-08-13 MED ORDER — DOXEPIN HCL 50 MG PO CAPS: 50.0000 mg | ORAL_CAPSULE | Freq: Every day | ORAL | Status: DC

## 2010-08-27 NOTE — Discharge Summary (Signed)
NAME:  Shawn Lam, Shawn Lam NO.:  [id number]   MEDICAL RECORD NO.:  [id number]          PATIENT TYPE:  OIB   LOCATION:  6533                         FACILITY:  [facility]   PHYSICIAN:  Fransico Him, M.D.     DATE OF BIRTH:  1950/07/14   DATE OF ADMISSION:  02/01/2005  DATE OF DISCHARGE:  02/02/2005                                 DISCHARGE SUMMARY   CHIEF COMPLAINT AND REASON FOR ADMISSION:  Shawn Lam is a 60 year old male  patient initially evaluated by Dr. Radford Pax on December 23, 2004 due to  orthostatic presyncope as part of his work-up for this problem.  He had  undergone a stress Cardiolite study which showed a fixed inferior apical  defect consistent with diaphragmatic attenuation with increased __________  uptake, reversible defect in the inferior wall from mid LV to base,  questionable ischemia versus diaphragm.  Also noted to have mild left  ventricular dysfunction.  No EKG changes during the stress test.  Based on  these findings, Dr. Radford Pax had recommended the patient undergo diagnostic  coronary angiography and he was then admitted to [facility] on the day of  admission for this procedure.   ADMISSION DIAGNOSES:  1.  Abnormal stress perfusion Cardiolite study revealing reversible defect      in the inferior wall, and need of diagnostic coronary angiography.  2.  History of hyperkalemia, treated with Kayexalate followed by      endocrinology.  3.  Orthostatic presyncope previously evaluated by Dr. Radford Pax with tilt      table and echocardiogram.  4.  Right testicular nodule followed by Dr. Cletus Gash.   HOSPITAL COURSE:  Problem #1.  Abnormal Cardiolite study:  The patient was  taken to the cath lab by Dr. Radford Pax where a coronary angiography was  performed.  He was found to have an 85% lesion in the LAD, mid vessel, as  well as a 50% lesion in the RCA.  Dr. Leonia Reeves was consulted for coronary  intervention.  The patient has subsequently undergone PCI and Liberte  stent  implantation per Dr. Leonia Reeves in the LAD.  He was started on Integrilin post  procedure as well as aspirin and Plavix.   On the morning of discharge, the patient's vital signs were stable.  Blood  pressure ranged from 105-118/55, heart rate was 50-60.  Right groin was  unremarkable.  Pulse was present.  Hemoglobin was stable at 12.8, platelets  184,000.  Creatinine 0.9 post catheterization.   The patient was deemed appropriate for discharge home after Integrilin had  completed the infusion.  Dr. Radford Pax wants to continue aspirin and Plavix.  Check a fasting lipid panel on the patient in the office and the patient  will have smoking cessation prior to discharge.   FINAL DIAGNOSES:  1.  Obstructive coronary artery disease involving the left anterior      descending, status post percutaneous coronary intervention and Liberte      stent.  2.  Nonobstructive coronary artery disease 50% lesion to the right coronary      artery.  3.  Lipid status unknown.  4.  Prior history of orthostatic presyncope.   DISCHARGE MEDICATIONS:  1.  Plavix 75 mg daily.  2.  Aspirin 325 mg daily.  3.  Doxepin 25 mg two at bedtime.  4.  Zoloft 100 mg two daily.  5.  Zantac 150 mg twice daily.   DIET:  Heart healthy.   ACTIVITY:  Activity slowly.  Shower only next two days.  Do not lift  anything more than 10 pounds the next two days.  Call if any swelling or  increased bruising in the right groin.   FOLLOW UP:  He is to see Dr. Radford Pax in two weeks.  I will schedule this  appointment.  He is to have a fasting cholesterol and blood work drawn in  one week.  I will also schedule this appointment.      [place] Ruel Favors, M.D.  Electronically Signed    ALE/MEDQ  D:  02/02/2005  T:  02/02/2005  Job:  [id number]   cc:   Leone Haven, M.D.  Fax: [phone]

## 2010-08-27 NOTE — Cardiovascular Report (Signed)
NAME:  Shawn Lam, Shawn Lam NO.:  [id number]   MEDICAL RECORD NO.:  [id number]          PATIENT TYPE:  OIB   LOCATION:  6533                         FACILITY:  [facility]   PHYSICIAN:  Fransico Him, M.D.     DATE OF BIRTH:  1950/09/15   DATE OF PROCEDURE:  02/01/2005  DATE OF DISCHARGE:                              CARDIAC CATHETERIZATION   REFERRING PHYSICIAN:  Dr. Leone Haven.   PROCEDURE:  Left heart catheterization, coronary angiography, left  ventriculography.   OPERATOR:  Fransico Him, M.D.   COMPLICATIONS:  None.   MEDICATIONS:  Morphine 2 milligrams IV, Versed 2 milligrams IV.   IV CONTRAST:  100 cc.   IV ACCESS:  Via right femoral artery 6-French sheath.   This is a 60 year old white male with no previous cardiac history who  initially presented to me with episodes of presyncope which appeared  orthostatic but had a negative tilt table test. He was also having  exertional fatigue so proceeded with a stress Cardiolite study which  revealed a reversible defect in the inferior wall. He now presents for  cardiac catheterization.   The patient was brought to the cardiac catheterization laboratory in the  fasting, nonsedated state. Informed consent was obtained. The patient was  connected to continuous heart rate and pulse oximeter monitoring and  intermittent blood pressure monitoring. The right groin was prepped and  draped in a sterile fashion. 1% Xylocaine was used for local anesthesia.  Using modified Seldinger technique, a 6-French sheath was placed in the  right femoral artery. Under fluoroscopic guidance, a 6-French JL-4 catheter  was placed in left coronary artery. Multiple cine films were taken in 30  degree RAO and 40 degree LAO views. This catheter was then exchanged out  over a guidewire for 6-French JL-4 catheter which was placed under  fluoroscopic guidance in the right coronary artery. Multiple cine films were  taken at 30 degree RAO and 40  degree LAO views. This catheter was then  exchanged out over a guidewire for 6-French angled pigtail catheter which  was placed under fluoroscopic guidance in the left ventricular cavity. Left  ventriculography was performed in a 30 degree RAO view using total of 30 cc  of contrast at 15 cc per second. The catheter was then pulled back across  the aortic valve with no significant gradient noted. At the end of  the  procedure, the patient was went on to PCI of the LAD.   RESULTS:  The left main coronary artery is widely patent and bifurcates into  a left anterior descending artery and left circumflex artery. Left anterior  descending artery has a proximal 85% narrowing, tubular narrowing and then  gives rise to a very large first diagonal branch which trifurcates into  three daughter branches all of which are widely patent. The ongoing LAD is  widely patent throughout its course to the apex. The left circumflex gives  rise to a high obtuse marginal one  or ramus branch which is a large vessel  and bifurcates into two daughter vessels, both of which are widely patent.  The left circumflex then gives rise to a second moderate-sized obtuse  marginal branch which is widely patent and it gives rise to three smaller  obtuse marginal branches all of which are widely patent.   The right coronary artery is widely patent in its proximal portion and then  has a 50% narrowing in the midportion before giving rise to a posterior  descending artery and posterior lateral artery, both of which are widely  patent.   Left ventriculography is low normal LV systolic function EF [id number]. LV pressure  116/6 mmHg. Aortic pressure 110/62 mmHg.   ASSESSMENT:  1.  One vessel obstructive coronary disease to the left anterior descending      artery with nonobstructive disease to the right coronary artery.  2.  Low normal left ventricular function.   PLAN:  PCI of the LAD per Dr. Leonia Reeves. Aspirin, Plavix, IV  Integrilin.      Fransico Him, M.D.  Electronically Signed     TT/MEDQ  D:  02/01/2005  T:  02/01/2005  Job:  [id number]   cc:   Leone Haven, M.D.  Fax: [phone]

## 2010-08-27 NOTE — Op Note (Signed)
NAME:  Shawn Lam, Shawn Lam NO.:  [id number]   MEDICAL RECORD NO.:  [id number]          PATIENT TYPE:  OIB   LOCATION:  2899                         FACILITY:  [facility]   PHYSICIAN:  Fransico Him, M.D.     DATE OF BIRTH:  08-24-1950   DATE OF PROCEDURE:  12/31/2004  DATE OF DISCHARGE:  12/31/2004                                 OPERATIVE REPORT   PROCEDURE:  Tilt table test.   OPERATOR:  Fransico Him, M.D.   INDICATIONS:  History of presyncope.   This is a very pleasant 60 year old male with a history of presyncope, who  now presents for tilt table test.  The patient was brought to the cardiac  catheterization in a fasting, nonsedated state.  Informed consent was  obtained.  The patient was connected to continuous heart rate and pulse  oximeter monitoring and intermittent blood pressure monitoring.  The  patient's blood pressure was measured supine for five minutes, ranging from  119/76 to 131/73 with heart rates in the low 50s.  The patient was then  tilted upright at 70 degrees for a total of 45 minutes.  The lowest blood  pressure achieved during the upright tilt was 112/73 mmHg.  Heart rates were  in the low 50s to a peak of 64 beats per minute.  The patient was completely  asymptomatic during the test.  At the end of the procedure, the patient was  placed supine and then later discharged to home.   ASSESSMENT:  1.  Presyncope.  2.  Negative tilt table test for syncope, orthostatic hypotension, or      vasovagal syncope.   PLAN:  Discharge to home.  Follow up with primary MD.      Fransico Him, M.D.  Electronically Signed     TT/MEDQ  D:  01/10/2005  T:  01/10/2005  Job:  [id number]

## 2010-08-27 NOTE — Cardiovascular Report (Signed)
NAME:  Shawn Lam, Shawn Lam NO.:  [id number]   MEDICAL RECORD NO.:  [id number]          PATIENT TYPE:  OIB   LOCATION:  6526                         FACILITY:  [facility]   PHYSICIAN:  Jettie Booze, MDDATE OF BIRTH:  12/03/1950   DATE OF PROCEDURE:  06/17/2005  DATE OF DISCHARGE:  06/18/2005                              CARDIAC CATHETERIZATION   REFERRING PHYSICIAN:  Dr. Fransico Him and Dr. Leone Haven.   PROCEDURES PERFORMED:  Intravascular ultrasound of the right coronary artery  along with stenting of the mid-right coronary artery.   OPERATOR:  Dr. Irish Lack.   INDICATIONS:  Abnormal stress test and angina.   PROCEDURAL NARRATIVE:  The 6-French sheath from the diagnostic cath was  changed out for a fresh 6-French right femoral arterial sheath. Additional  heparin was given for anticoagulation. A JR-4.0 catheter was advanced to the  vessel ostium of the right coronary artery under fluoroscopic guidance. An  Landscape architect was used to cross the lesion and IVUS catheter was  advanced across the lesion and automated pullback was performed. The IVUS  showed a 70% cross-sectional stenosis. The reference diameter of the vessel  was between 4.5 and 5.0. There was a significant eccentric plaque noted. The  IVUS catheter was removed. A 4.0 x 15 millimeter Vision stent was then  advanced to the lesion in the mid-right coronary artery and inflated to 14  atmospheres. The stent was then postdilated with a 4.5 x 13 Quantum Maverick  balloon to 12 atmospheres for 18 seconds. There was an excellent  angiographic result. There was TIMI III flow noted. There was no distal  vessel. There was no residual stenosis.   IMPRESSION:  1.  Significant mid-right coronary artery lesion.  2.  Successful direct stent placement to the mid-right coronary artery with      a 4.0 x 15 mm Vision which was subsequently postdilated to 4.5 mm.   RECOMMENDATIONS:  1.  The patient should  continue aspirin 325 milligrams p.o. daily and Plavix      75 milligrams p.o. daily  for at least 1 month.  2.  Continue integument intravenously for 18 hours.  3.  The sheath will be removed using manual compression.      Jettie Booze, MD  Electronically Signed     JSV/MEDQ  D:  06/17/2005  T:  06/18/2005  Job:  [id number]

## 2010-08-27 NOTE — Cardiovascular Report (Signed)
NAME:  Shawn Lam, Shawn Lam NO.:  [id number]   MEDICAL RECORD NO.:  [id number]          PATIENT TYPE:  OIB   LOCATION:  6526                         FACILITY:  [facility]   PHYSICIAN:  Fransico Him, M.D.     DATE OF BIRTH:  07-04-1950   DATE OF PROCEDURE:  06/17/2005  DATE OF DISCHARGE:                              CARDIAC CATHETERIZATION   REFERRING PHYSICIAN:  Dr. Leone Haven.   PROCEDURE:  Coronary angiography.   OPERATOR:  Dr. Fransico Him.   INDICATIONS:  Recurrent chest pain with abnormal Cardiolite.   COMPLICATIONS:  None.   IV ACCESS:  Via right femoral artery, 6-French sheath.   This is a very pleasant 60 year old white male with a history of orthostatic  hypotension which has resolved off Zoloft. Also a history of atypical chest  pain that initially was felt to be noncardiac. He continued to have chest  pain despite PTCA stenting of the LAD. He does have residual disease of 50%  by previous cath in the RCA but continues to have recurrent exertional  angina with reversible ischemia in the inferior wall. He now presents for  repeat cardiac catheterization.   The patient was brought to the cardiac catheterization laboratory in the  fasting nonsedated state. Informed consent was obtained. The patient was  connected to continuous heart rate and pulse oximetry monitoring and  intermittent blood pressure monitoring. The right groin was prepped and  draped in the sterile fashion. 1% Xylocaine was used for local anesthesia.  Using modified Seldinger technique, a 6-French sheath was placed in right  femoral artery. Under fluoroscopic guidance, a 6-French JL-4 catheter was  placed in left coronary artery. Multiple cine films were taken at 30 degree  RAO and 40 degree LAO views. This catheter was then exchanged out over a  guidewire for 6-French JR-4 catheter which was placed under fluoroscopic  guidance in the right coronary artery. Multiple cine films were taken  at 30  degree RAO and 40 degree LAO views. This catheter was then removed over a  guide wire. At the end of the procedure, the films were reviewed with Dr.  Tamala Julian.   RESULTS:  1.  Left main coronary artery is widely patent and bifurcates in to the left      anterior descending artery and left circumflex artery.  2.  Left anterior descending artery has a stent in the ostial portion which      is widely patent. The left anterior descending artery is widely patent      throughout its course in the apex giving rise to a diagonal branch which      is very large, widely patent and trifurcates into three daughter      branches all of which are widely patent.  3.  Left circumflex is widely patent throughout its course in the AV groove.      It gives rise to four obtuse marginal branches all of which are widely      patent.  4.  The right coronary artery has a mid-narrowing of 60% and then is widely  patent in its proximal and distal portions bifurcating into posterior      descending artery and posterior lateral artery, both of which are widely      patent.   Left ventriculography was not performed since this was basically to  reevaluate coronary anatomy.   ASSESSMENT:  1.  Two-vessel obstructive coronary disease with patent left anterior      descending artery stent, borderline mid-right coronary artery lesion.  2.  Chest pain syndrome with positive ischemia in the inferior wall.  3.  Orthostatic hypotension resolved off Zoloft.   PLAN:  We will review the films with Dr. Tamala Julian. Continue aspirin and Plavix  for now.      Fransico Him, M.D.  Electronically Signed     TT/MEDQ  D:  06/17/2005  T:  06/18/2005  Job:  [id number]

## 2010-08-27 NOTE — Cardiovascular Report (Signed)
NAME:  Shawn Lam, Shawn Lam NO.:  [id number]   MEDICAL RECORD NO.:  [id number]          PATIENT TYPE:  OIB   LOCATION:  6533                         FACILITY:  [facility]   PHYSICIAN:  Barnett Abu, M.D.  DATE OF BIRTH:  01/08/1951   DATE OF PROCEDURE:  02/01/2005  DATE OF DISCHARGE:                              CARDIAC CATHETERIZATION   PROCEDURE PERFORMED:  1.  PCI/stent implant proximal anterior descending artery.  2.  Percutaneous closure right femoral artery.   INDICATIONS:  [name] is a 60 year old man who initially saw Dr.  Radford Pax because of orthostatic hypotension. He also complained of generalized  fatigue. Dr. Radford Pax completed a perfusion study which showed a reversible  inferior defect. She has now completed coronary angiography revealing a  proximal 80-90% anterior descending artery stenosis. He is to undergo  catheter-based revascularization at this time.   PROCEDURE NOTE:  The patient is brought to the cardiac catheterization  laboratory. Via the previously placed 6-French catheter sheath, a 3.5 CLS  left guiding catheter was advanced the ascending aorta where left coronary  os was engaged. A 0.014 inch Luge intracoronary guidewire was passed across  the lesion without difficulty. The patient had previously received 4500  units of heparin and a bolus of Aggrastat at 25 mcg/kg followed by constant  infusion. The resultant ACT was 286 seconds. The lesion was primarily  stented using a 3.5/12 mm Scimed Liberte intracoronary bare-metal stent.  This was carefully placed across the lesion of the proximal anterior  descending artery and inflated to a peak pressure of 14 atmospheres for  approximately 1 minute. This balloon was deflated and removed and a 3.5/8 mm  Scimed Quantum Maverick intracoronary balloon was advanced and positioned  more distal portion of the stent and inflated to peak pressure of 20  atmospheres for approximately 45 seconds. This  balloon was deflated, removed  and following confirmation of adequate patency in orthogonal views, both  with and without the guidewire in place, the guiding catheter was removed. A  right femoral arteriogram in the 45 degrees RAO angulation via the catheter  sheath by hand injection revealed adequate anatomy for placement of a  percutaneous closure device AngioSeal. This was subsequently deployed  successfully with good hemostasis and intact distal pulse. The patient was  transported to recovery area in stable condition.   ANGIOGRAPHY:  As mentioned, the lesion treated was in the proximal portion  anterior descending artery was relatively focal and concentric. Following  balloon dilatation and stent implantation, there was no residual stenosis.  Distal flow was excellent.   FINAL IMPRESSION:  1.  Atherosclerotic cardiovascular disease, single vessel.  2.  Status post successful percutaneous coronary intervention/bare-metal      stent implantation proximal anterior descending artery.  3.  Typical angina was not reproduced with device insertion or balloon      inflation.      Barnett Abu, M.D.  Electronically Signed     JHE/MEDQ  D:  02/01/2005  T:  02/01/2005  Job:  [id number]

## 2010-08-27 NOTE — Op Note (Signed)
   NAME:  Shawn Lam, Shawn Lam NO.:  [id number]   MEDICAL RECORD NO.:  [id number]                   PATIENT TYPE:  AMB   LOCATION:  ENDO                                 FACILITY:  [facility]   PHYSICIAN:  Wonda Horner, M.D.                DATE OF BIRTH:  14-May-1950   DATE OF PROCEDURE:  08/09/2002  DATE OF DISCHARGE:                                 OPERATIVE REPORT   PROCEDURE:  Colonoscopy.   INDICATIONS FOR PROCEDURE:  Family history of colon cancer and colon polyps.   PREMEDICATIONS:  1. Fentanyl 25 micrograms IV.  2. Versed 12.5 mg IV.   DESCRIPTION OF PROCEDURE:  Informed consent was obtained. With the patient  in the left lateral decubitus position, a rectal examination was performed  and no masses were felt.   The Olympus colonoscope was inserted into the rectum and advanced around the  colon to the cecum. Cecal landmarks were identified. The cecum and ascending  colon were normal. The transverse colon was normal. The descending colon,  sigmoid and rectum were normal. He tolerated the procedure well without  complications.   IMPRESSION:  Normal colonoscopy to the cecum.                                               Wonda Horner, M.D.    Katherina Mires  D:  08/09/2002  T:  08/09/2002  Job:  [id number]   cc:   Leone Haven, M.D.  [address]  [place]  [place] [address]  Fax: [phone]

## 2010-12-14 ENCOUNTER — Other Ambulatory Visit: Payer: Self-pay | Admitting: Family Medicine

## 2011-01-12 ENCOUNTER — Telehealth: Payer: Self-pay | Admitting: Family Medicine

## 2011-01-12 DIAGNOSIS — R319 Hematuria, unspecified: Secondary | ICD-10-CM

## 2011-01-12 DIAGNOSIS — E785 Hyperlipidemia, unspecified: Secondary | ICD-10-CM

## 2011-01-12 DIAGNOSIS — R7309 Other abnormal glucose: Secondary | ICD-10-CM

## 2011-01-12 NOTE — Telephone Encounter (Signed)
Please verify that pt is getting PSA done at urology this year.. If not ...add PSA V76.44 to labs drawn.

## 2011-01-12 NOTE — Telephone Encounter (Signed)
Message copied by Jinny Sanders on Wed Jan 12, 2011 12:12 PM ------      Message from: Ellamae Sia      Created: Tue Jan 11, 2011 12:21 PM      Regarding: labs for Thursday 10-4       Patient is scheduled for CPX labs, please order future labs, Thanks , Karna Christmas

## 2011-01-13 ENCOUNTER — Other Ambulatory Visit (INDEPENDENT_AMBULATORY_CARE_PROVIDER_SITE_OTHER): Payer: 59

## 2011-01-13 DIAGNOSIS — Z125 Encounter for screening for malignant neoplasm of prostate: Secondary | ICD-10-CM

## 2011-01-13 DIAGNOSIS — E785 Hyperlipidemia, unspecified: Secondary | ICD-10-CM

## 2011-01-13 DIAGNOSIS — R7309 Other abnormal glucose: Secondary | ICD-10-CM

## 2011-01-13 LAB — LIPID PANEL
Cholesterol: 159 mg/dL (ref 0–200)
LDL Cholesterol: 81 mg/dL (ref 0–99)
Total CHOL/HDL Ratio: 2
Triglycerides: 37 mg/dL (ref 0.0–149.0)
VLDL: 7.4 mg/dL (ref 0.0–40.0)

## 2011-01-13 LAB — COMPREHENSIVE METABOLIC PANEL
ALT: 14 U/L (ref 0–53)
AST: 22 U/L (ref 0–37)
Albumin: 4 g/dL (ref 3.5–5.2)
Alkaline Phosphatase: 53 U/L (ref 39–117)
Calcium: 9.3 mg/dL (ref 8.4–10.5)
Chloride: 108 mEq/L (ref 96–112)
Creatinine, Ser: 1.1 mg/dL (ref 0.4–1.5)
GFR: 73.99 mL/min (ref >60.00)
Glucose, Bld: 103 mg/dL — ABNORMAL HIGH (ref 70–99)
Potassium: 5.1 mEq/L (ref 3.5–5.1)
Sodium: 140 mEq/L (ref 135–145)
Total Bilirubin: 1 mg/dL (ref 0.3–1.2)
Total Protein: 6.1 g/dL (ref 6.0–8.3)

## 2011-01-13 LAB — PSA: PSA: 0.51 ng/mL (ref 0.10–4.00)

## 2011-01-13 NOTE — Progress Notes (Signed)
Addended by: Marchia Bond on: 01/13/2011 08:15 AM   Modules accepted: Orders

## 2011-01-19 ENCOUNTER — Encounter: Payer: Self-pay | Admitting: Family Medicine

## 2011-01-20 ENCOUNTER — Ambulatory Visit (INDEPENDENT_AMBULATORY_CARE_PROVIDER_SITE_OTHER): Payer: 59 | Admitting: Family Medicine

## 2011-01-20 ENCOUNTER — Telehealth: Payer: Self-pay | Admitting: Family Medicine

## 2011-01-20 ENCOUNTER — Encounter: Payer: Self-pay | Admitting: Family Medicine

## 2011-01-20 DIAGNOSIS — R5381 Other malaise: Secondary | ICD-10-CM

## 2011-01-20 DIAGNOSIS — E785 Hyperlipidemia, unspecified: Secondary | ICD-10-CM

## 2011-01-20 DIAGNOSIS — R319 Hematuria, unspecified: Secondary | ICD-10-CM

## 2011-01-20 DIAGNOSIS — I2510 Atherosclerotic heart disease of native coronary artery without angina pectoris: Secondary | ICD-10-CM

## 2011-01-20 DIAGNOSIS — Z0000 Encounter for general adult medical examination without abnormal findings: Secondary | ICD-10-CM

## 2011-01-20 DIAGNOSIS — R42 Dizziness and giddiness: Secondary | ICD-10-CM

## 2011-01-20 DIAGNOSIS — R5383 Other fatigue: Secondary | ICD-10-CM

## 2011-01-20 DIAGNOSIS — R7309 Other abnormal glucose: Secondary | ICD-10-CM

## 2011-01-20 MED ORDER — ATORVASTATIN CALCIUM 80 MG PO TABS: 80.0000 mg | ORAL_TABLET | Freq: Every day | ORAL | Status: DC

## 2011-01-20 NOTE — Assessment & Plan Note (Deleted)
Followed by Dr. Radford Pax. 2 stents in place on plavix.

## 2011-01-20 NOTE — Assessment & Plan Note (Signed)
Stable

## 2011-01-20 NOTE — Telephone Encounter (Signed)
Notify pt that records received from cadiologist... Looks like ejection fraction was mildly decreased... At 48 % (should be 50-55%).. MAY BE CAUSING SOME FATIGUE, BUT IF HE IS AGREEABLE I WILL ORDER SOME OTHER LABS TO EVAL FOR OTHER CAUSES of fatigue.  Sorry about the caps.

## 2011-01-20 NOTE — Progress Notes (Signed)
  Subjective:    Patient ID: Shawn Lam, male    DOB: [birth year], 60 y.o.   MRN: [id number]  HPI The patient is here for annual wellness exam and preventative care.    Elevated Cholesterol:Almost at goal LDL <70 on 80 mgsimvastatin Hx of CAD. Using medications without problems:None Muscle aches: None Diet compliance: Good Exercise:Daily, running. Other complaints:  CAD, hx of 2 stents in 2006.. Saw Dr. Radford Pax 3 weeks ago. Had been having fatigue,some SOB with getting out of car, occ lightheadded .[name] Has had to stop some with exercise due to feeling "cold sweat" presyncopal. Had stress ECHO test that per pt showed decreased blood pumping.[name] No changes made.  EKG stable No labs done per pt.  Prediabetes: Improved some from 2010 but still slightly elevated.   Hematuria follpowed by Uro at [facility]    Review of Systems  Constitutional: Positive for fatigue. Negative for fever.  HENT: Negative for ear pain.   Eyes: Negative for pain.  Respiratory: Positive for shortness of breath. Negative for cough and wheezing.   Cardiovascular: Negative for chest pain, palpitations and leg swelling.  Gastrointestinal: Negative for abdominal pain.  Genitourinary: Negative for dysuria and hematuria.  Musculoskeletal: Negative for back pain.  Neurological: Negative for syncope.  Psychiatric/Behavioral: Negative for behavioral problems and dysphoric mood.       Objective:   Physical Exam        Assessment & Plan:  Complete Physcial Exam: The patient's preventative maintenance and recommended screening tests for an annual wellness exam were reviewed in full today. Brought up to date unless services declined.  Counselled on the importance of diet, exercise, and its role in overall health and mortality. The patient's FH and SH was reviewed, including their home life, tobacco status, and drug and alcohol status.   [id number]ing[facility] vaccine:WIll look into. Up to date with td..  Last 2009 PSA stable, DRE performed at [facility]. Colon cancer screening: Colonoscopy 08/01/2007, repeat in 5 years.

## 2011-01-20 NOTE — Patient Instructions (Addendum)
Continue working on low carbohydrate and fat diet. Continue exercise and weight loss.  Stop simvastatin.. Change to generic lipitor 80 mg daily.  Recheck cholesterol in 3 months.   Call insurance to check into shingle vaccine coverage. Will get records from Dr. Radford Pax... If not clear cause of symptoms.. Will consider eval with cbc, tsh, b12 etc.

## 2011-01-20 NOTE — Assessment & Plan Note (Signed)
Per pt cardiologist has looked into heatrt as source of recetn issues.. Sounds like possible early CHF? Will get records from Dr. Radford Pax... If not clear cause of symptoms.. Will consider eval with cbc, tsh, b12 etc.

## 2011-01-20 NOTE — Assessment & Plan Note (Signed)
Not at goal on simvastatin 80 mg daily.Shawn Lam to lipitor 80 mg daily and repeat in 3 moths. Continue healthy lifestyle as he is (he feels this is maxed out)

## 2011-01-21 NOTE — Telephone Encounter (Signed)
Patient agreeable and appt made for next week

## 2011-01-25 ENCOUNTER — Other Ambulatory Visit: Payer: 59

## 2011-01-25 ENCOUNTER — Other Ambulatory Visit (INDEPENDENT_AMBULATORY_CARE_PROVIDER_SITE_OTHER): Payer: 59

## 2011-01-25 ENCOUNTER — Ambulatory Visit (INDEPENDENT_AMBULATORY_CARE_PROVIDER_SITE_OTHER): Payer: 59 | Admitting: *Deleted

## 2011-01-25 DIAGNOSIS — Z2911 Encounter for prophylactic immunotherapy for respiratory syncytial virus (RSV): Secondary | ICD-10-CM

## 2011-01-25 DIAGNOSIS — R42 Dizziness and giddiness: Secondary | ICD-10-CM

## 2011-01-25 DIAGNOSIS — R5383 Other fatigue: Secondary | ICD-10-CM

## 2011-01-25 DIAGNOSIS — Z23 Encounter for immunization: Secondary | ICD-10-CM

## 2011-01-25 DIAGNOSIS — R5381 Other malaise: Secondary | ICD-10-CM

## 2011-01-25 LAB — CBC WITH DIFFERENTIAL/PLATELET
Basophils Absolute: 0 10*3/uL (ref 0.0–0.1)
Eosinophils Absolute: 0.7 10*3/uL (ref 0.0–0.7)
HCT: 35.7 % — ABNORMAL LOW (ref 39.0–52.0)
Hemoglobin: 11.6 g/dL — ABNORMAL LOW (ref 13.0–17.0)
Lymphs Abs: 1.5 10*3/uL (ref 0.7–4.0)
MCHC: 32.4 g/dL (ref 30.0–36.0)
MCV: 84.4 fl (ref 78.0–100.0)
Monocytes Relative: 9 % (ref 3.0–12.0)
Neutro Abs: 2.9 10*3/uL (ref 1.4–7.7)
RDW: 16.2 % — ABNORMAL HIGH (ref 11.5–14.6)

## 2011-01-26 LAB — TESTOSTERONE, FREE, TOTAL, SHBG: Sex Hormone Binding: 75 nmol/L — ABNORMAL HIGH (ref 13–71)

## 2011-03-24 ENCOUNTER — Ambulatory Visit (INDEPENDENT_AMBULATORY_CARE_PROVIDER_SITE_OTHER): Payer: 59 | Admitting: Family Medicine

## 2011-03-24 ENCOUNTER — Encounter: Payer: Self-pay | Admitting: Family Medicine

## 2011-03-24 VITALS — BP 132/80 | HR 98 | Temp 98.2°F | Wt 189.2 lb

## 2011-03-24 DIAGNOSIS — J019 Acute sinusitis, unspecified: Secondary | ICD-10-CM

## 2011-03-24 DIAGNOSIS — J0130 Acute sphenoidal sinusitis, unspecified: Secondary | ICD-10-CM | POA: Insufficient documentation

## 2011-03-24 MED ORDER — HYDROCOD POLST-CHLORPHEN POLST 10-8 MG/5ML PO LQCR: 5.0000 mL | Freq: Every evening | ORAL | Status: DC | PRN

## 2011-03-24 NOTE — Patient Instructions (Signed)
You have a sinus infection, likely viral as early on. Take medicine as prescribed: tussionex for cough at night (has hydrocodone, price out and let me know if too expensive.) Push fluids and plenty of rest. Nasal saline irrigation or neti pot to help drain sinuses. May use simple mucinex with plenty of fluid to help mobilize mucous. Let us know if fever >101.5, trouble opening/closing mouth, difficulty swallowing, or worsening productive cough. - you may need to be seen again. If going on past weekend, call me.

## 2011-03-24 NOTE — Assessment & Plan Note (Signed)
6d hx, anticipate viral. If sxs going on past weekend, to call me for abx course. Cough syrup for night time.

## 2011-03-24 NOTE — Progress Notes (Signed)
  Subjective:    Patient ID: Shawn Lam, male    DOB: 1950/11/13, 60 y.o.   MRN: [id number]  HPI CC: congestion  6d h/o sinus congestion, ST, earache, coughing.  Started with RN, ST.  Head congestion spreading to chest.  Feels mucous stuck in chest, not very productive cough.  + chills.  Body aches present.  Not getting better.  Cough keeping him up at night.  Has tried pseudophed which didn't help.  No fevers, abd pain, n/v, chest pain or shortness of breath, tooth pain.  Wife sick as well.  Wife smokes outside.  No h/o asthma.  H/o seasonal allergies controlled with xyzal.  Review of Systems Per HPI    Objective:   Physical Exam  Nursing note and vitals reviewed. Constitutional: He appears well-developed and well-nourished. No distress.  HENT:  Head: Normocephalic and atraumatic.  Right Ear: Hearing, tympanic membrane, external ear and ear canal normal.  Left Ear: Hearing, tympanic membrane, external ear and ear canal normal.  Nose: Nose normal. No mucosal edema or rhinorrhea. Right sinus exhibits no maxillary sinus tenderness and no frontal sinus tenderness. Left sinus exhibits no maxillary sinus tenderness and no frontal sinus tenderness.  Mouth/Throat: Uvula is midline, oropharynx is clear and moist and mucous membranes are normal. No oropharyngeal exudate, posterior oropharyngeal edema, posterior oropharyngeal erythema or tonsillar abscesses.  Eyes: Conjunctivae and EOM are normal. Pupils are equal, round, and reactive to light. No scleral icterus.  Neck: Normal range of motion. Neck supple.  Cardiovascular: Normal rate, regular rhythm, normal heart sounds and intact distal pulses.   No murmur heard. Pulmonary/Chest: Effort normal and breath sounds normal. No respiratory distress. He has no wheezes. He has no rales.  Lymphadenopathy:    He has no cervical adenopathy.  Skin: Skin is warm and dry. No rash noted.       Assessment & Plan:

## 2011-04-19 ENCOUNTER — Other Ambulatory Visit (INDEPENDENT_AMBULATORY_CARE_PROVIDER_SITE_OTHER): Payer: 59

## 2011-04-19 DIAGNOSIS — E785 Hyperlipidemia, unspecified: Secondary | ICD-10-CM

## 2011-04-19 LAB — LIPID PANEL: Total CHOL/HDL Ratio: 2

## 2011-08-04 ENCOUNTER — Other Ambulatory Visit: Payer: Self-pay | Admitting: Family Medicine

## 2011-08-05 NOTE — Telephone Encounter (Signed)
Patient not due for cpx until 01-2012 so refilled until then

## 2011-08-05 NOTE — Telephone Encounter (Signed)
Appears due for Cpx, refill until then

## 2011-09-01 ENCOUNTER — Other Ambulatory Visit: Payer: Self-pay | Admitting: Family Medicine

## 2012-01-09 ENCOUNTER — Other Ambulatory Visit: Payer: Self-pay | Admitting: Family Medicine

## 2012-01-17 ENCOUNTER — Telehealth: Payer: Self-pay | Admitting: Family Medicine

## 2012-01-17 DIAGNOSIS — E785 Hyperlipidemia, unspecified: Secondary | ICD-10-CM

## 2012-01-17 DIAGNOSIS — R7309 Other abnormal glucose: Secondary | ICD-10-CM

## 2012-01-17 NOTE — Telephone Encounter (Signed)
Message copied by Jinny Sanders on Tue Jan 17, 2012 11:02 AM ------      Message from: Ellamae Sia      Created: Fri Jan 13, 2012 12:39 PM      Regarding: Lab orders for Friday, 10.8.13       Patient is scheduled for CPX labs, please order future labs, Thanks , Karna Christmas

## 2012-01-20 ENCOUNTER — Other Ambulatory Visit (INDEPENDENT_AMBULATORY_CARE_PROVIDER_SITE_OTHER): Payer: 59

## 2012-01-20 DIAGNOSIS — E785 Hyperlipidemia, unspecified: Secondary | ICD-10-CM

## 2012-01-20 LAB — COMPREHENSIVE METABOLIC PANEL
ALT: 20 U/L (ref 0–53)
Albumin: 3.5 g/dL (ref 3.5–5.2)
CO2: 26 mEq/L (ref 19–32)
Calcium: 8.7 mg/dL (ref 8.4–10.5)
Chloride: 110 mEq/L (ref 96–112)
GFR: 64.06 mL/min (ref >60.00)
Glucose, Bld: 102 mg/dL — ABNORMAL HIGH (ref 70–99)
Potassium: 5.2 mEq/L — ABNORMAL HIGH (ref 3.5–5.1)
Sodium: 141 mEq/L (ref 135–145)
Total Protein: 5.7 g/dL — ABNORMAL LOW (ref 6.0–8.3)

## 2012-01-20 LAB — LIPID PANEL
Cholesterol: 140 mg/dL (ref 0–200)
VLDL: 8.4 mg/dL (ref 0.0–40.0)

## 2012-01-27 ENCOUNTER — Ambulatory Visit (INDEPENDENT_AMBULATORY_CARE_PROVIDER_SITE_OTHER): Payer: 59 | Admitting: Family Medicine

## 2012-01-27 ENCOUNTER — Encounter: Payer: Self-pay | Admitting: Family Medicine

## 2012-01-27 ENCOUNTER — Telehealth: Payer: Self-pay | Admitting: *Deleted

## 2012-01-27 VITALS — BP 112/68 | HR 64 | Temp 98.1°F | Ht 71.0 in | Wt 170.0 lb

## 2012-01-27 DIAGNOSIS — R5383 Other fatigue: Secondary | ICD-10-CM

## 2012-01-27 DIAGNOSIS — Z0000 Encounter for general adult medical examination without abnormal findings: Secondary | ICD-10-CM

## 2012-01-27 DIAGNOSIS — R7309 Other abnormal glucose: Secondary | ICD-10-CM

## 2012-01-27 DIAGNOSIS — E876 Hypokalemia: Secondary | ICD-10-CM

## 2012-01-27 DIAGNOSIS — Z125 Encounter for screening for malignant neoplasm of prostate: Secondary | ICD-10-CM

## 2012-01-27 DIAGNOSIS — E785 Hyperlipidemia, unspecified: Secondary | ICD-10-CM

## 2012-01-27 DIAGNOSIS — Z23 Encounter for immunization: Secondary | ICD-10-CM

## 2012-01-27 DIAGNOSIS — F411 Generalized anxiety disorder: Secondary | ICD-10-CM

## 2012-01-27 DIAGNOSIS — R5381 Other malaise: Secondary | ICD-10-CM

## 2012-01-27 LAB — PSA: PSA: 0.41 ng/mL (ref 0.10–4.00)

## 2012-01-27 LAB — POTASSIUM: Potassium: 4.6 mEq/L (ref 3.5–5.1)

## 2012-01-27 MED ORDER — SERTRALINE HCL 50 MG PO TABS: 50.0000 mg | ORAL_TABLET | Freq: Every day | ORAL | Status: DC

## 2012-01-27 NOTE — Assessment & Plan Note (Signed)
Well controlled on lipitor 80.. May be causing leg cramps.  Try treating leg cramps with increased water and yellow mustard.

## 2012-01-27 NOTE — Progress Notes (Signed)
Subjective:    Patient ID: Shawn Lam, male    DOB: 1950/07/26, 61 y.o.   MRN: [id number]  HPI  The patient is here for annual wellness exam and preventative care.    Anxiety:Poor control on buspar. He reports that it is not helping much over last year. He feels irritable and nervous all the time. No depression, no sadness. Does have frequent waking at night. Has never seen a psychiatrist. Never been on other meds for mood in apst. No SI. No increase in stress.   Elevated Cholesterol:At goal <70 on 80 of lipitor. Lab Results  Component Value Date   CHOL 140 01/20/2012   HDL 61.70 01/20/2012   LDLCALC 70 01/20/2012   LDLDIRECT 57 06/22/2009   TRIG 42.0 01/20/2012   CHOLHDL 2 01/20/2012  Using medications without problems:None Muscle aches: None Diet compliance: Good. Exercise: Jogging daily 11 miles. Other complaints: CAD   CAD, hx of 2 stents in 2006.Clarnce Flock Dr. Radford Pax, stable exam in 06/2011. In 2012 had been having fatigue,some SOB with getting out of car, occ lightheadded .[name] Has had to stop some with exercise due to feeling "cold sweat" presyncopal.  Had stress ECHO test that per pt showed decreased blood pumping.[name] No changes made.  EKG stable   Last year we did labs for fatigue: nml thyroid, cbc, testosterone, B12 was low and supplement was started. He has felt somewhat better with energy.   Prediabetes: improved some.  Potassium slightly high.     Review of Systems  Constitutional: Negative for fever, fatigue and unexpected weight change.  HENT: Negative for ear pain, congestion, sore throat, rhinorrhea, trouble swallowing and postnasal drip.   Eyes: Negative for pain.  Respiratory: Negative for cough, shortness of breath and wheezing.   Cardiovascular: Negative for chest pain, palpitations and leg swelling.  Gastrointestinal: Negative for nausea, abdominal pain, diarrhea, constipation and blood in stool.  Genitourinary: Negative for dysuria, urgency,  hematuria, discharge, penile swelling, scrotal swelling, difficulty urinating, penile pain and testicular pain.       Dark urine.. Saw uro.. Normal UA., Has had hematuria work up.  Skin: Negative for rash.  Neurological: Negative for syncope, weakness, light-headedness, numbness and headaches.  Psychiatric/Behavioral: Negative for behavioral problems and dysphoric mood. The patient is nervous/anxious.        Objective:   Physical Exam  Constitutional: He appears well-developed and well-nourished.  Non-toxic appearance. He does not appear ill. No distress.  HENT:  Head: Normocephalic and atraumatic.  Right Ear: Hearing, tympanic membrane, external ear and ear canal normal.  Left Ear: Hearing, tympanic membrane, external ear and ear canal normal.  Nose: Nose normal.  Mouth/Throat: Uvula is midline, oropharynx is clear and moist and mucous membranes are normal.  Eyes: Conjunctivae normal, EOM and lids are normal. Pupils are equal, round, and reactive to light. No foreign bodies found.  Neck: Trachea normal, normal range of motion and phonation normal. Neck supple. Carotid bruit is not present. No mass and no thyromegaly present.  Cardiovascular: Normal rate, regular rhythm, S1 normal, S2 normal, intact distal pulses and normal pulses.  Exam reveals no gallop.   No murmur heard. Pulmonary/Chest: Breath sounds normal. He has no wheezes. He has no rhonchi. He has no rales.  Abdominal: Soft. Normal appearance and bowel sounds are normal. There is no hepatosplenomegaly. There is no tenderness. There is no rebound, no guarding and no CVA tenderness. No hernia. Hernia confirmed negative in the right inguinal area and confirmed negative in the  left inguinal area.  Genitourinary: Testes normal and penis normal. Right testis shows no mass and no tenderness. Left testis shows no mass and no tenderness. No paraphimosis or penile tenderness.  Lymphadenopathy:    He has no cervical adenopathy.       Right:  No inguinal adenopathy present.       Left: No inguinal adenopathy present.  Neurological: He is alert. He has normal strength and normal reflexes. No cranial nerve deficit or sensory deficit. Gait normal.  Skin: Skin is warm, dry and intact. No rash noted.  Psychiatric: He has a normal mood and affect. His speech is normal and behavior is normal. Judgment normal.          Assessment & Plan:  The patient's preventative maintenance and recommended screening tests for an annual wellness exam were reviewed in full today. Brought up to date unless services declined.  Counselled on the importance of diet, exercise, and its role in overall health and mortality. The patient's FH and SH was reviewed, including their home life, tobacco status, and drug and alcohol status.   Vaccines: Uptodate, flu given today. Nonsmoker PSA due, DRE performed at [facility].  Colon cancer screening: Colonoscopy 08/01/2007, repeat in 5 years.

## 2012-01-27 NOTE — Assessment & Plan Note (Signed)
Poor control. Stop buspar and trial of sertraline.

## 2012-01-27 NOTE — Patient Instructions (Addendum)
Wean off buspar over 2 weeks. Start sertraline 50 mg daily now.  Push fluids, yellow mustard for leg cramping.  Okay to try purpurex from [facility] for easy bruising.  Follow up 1 month for mood recheck.

## 2012-01-27 NOTE — Addendum Note (Signed)
Addended by: [name] E on: 01/27/2012 11:23 AM   Modules accepted: Orders

## 2012-01-27 NOTE — Assessment & Plan Note (Signed)
Improved with B12.

## 2012-01-27 NOTE — Telephone Encounter (Signed)
[facility] day supply [facility] to [facility].

## 2012-02-28 ENCOUNTER — Ambulatory Visit: Payer: 59 | Admitting: Family Medicine

## 2012-03-01 ENCOUNTER — Ambulatory Visit: Payer: 59 | Admitting: Family Medicine

## 2012-03-09 ENCOUNTER — Ambulatory Visit (INDEPENDENT_AMBULATORY_CARE_PROVIDER_SITE_OTHER): Payer: 59 | Admitting: Family Medicine

## 2012-03-09 ENCOUNTER — Encounter: Payer: Self-pay | Admitting: Family Medicine

## 2012-03-09 VITALS — BP 110/72 | HR 83 | Temp 97.9°F | Ht 71.0 in | Wt 168.5 lb

## 2012-03-09 DIAGNOSIS — F411 Generalized anxiety disorder: Secondary | ICD-10-CM

## 2012-03-09 MED ORDER — ESCITALOPRAM OXALATE 20 MG PO TABS: 20.0000 mg | ORAL_TABLET | Freq: Every day | ORAL | Status: DC

## 2012-03-09 NOTE — Progress Notes (Signed)
  Subjective:    Patient ID: Shawn Lam, male    DOB: 31-Mar-1951, 61 y.o.   MRN: [id number]  HPI 61 year old male with anxiety: Placed on sertraline 50 mg daily  1 month ago. No SE to the med but no benefit. Came off buspar okay without issue.  Shawn Lam reports that her remembers that Shawn Lam tried sertralin in past with minimal benefit.. Only had some benefit at very high dose.     Review of Systems  Constitutional: Negative for fever and fatigue.  HENT: Negative for hearing loss.   Eyes: Negative for pain.  Respiratory: Negative for cough and shortness of breath.   Cardiovascular: Negative for chest pain.       Objective:   Physical Exam  Constitutional: Vital signs are normal. Shawn Lam appears well-developed and well-nourished.  HENT:  Head: Normocephalic.  Right Ear: Hearing normal.  Left Ear: Hearing normal.  Mouth/Throat: Mucous membranes are normal.  Neck: Trachea normal. Carotid bruit is not present. No mass present.  Cardiovascular: Normal rate, regular rhythm and normal pulses.  Exam reveals no gallop, no distant heart sounds and no friction rub.   No murmur heard.      No peripheral edema  Pulmonary/Chest: Effort normal and breath sounds normal. No respiratory distress.  Skin: Skin is warm, dry and intact. No rash noted.  Psychiatric: Shawn Lam has a normal mood and affect. His speech is normal and behavior is normal. Thought content normal. Shawn Lam expresses no homicidal and no suicidal ideation. Shawn Lam expresses no suicidal plans and no homicidal plans.          Assessment & Plan:

## 2012-03-09 NOTE — Patient Instructions (Addendum)
Stop sertraline. Start lexapro 20 mg daily. Follow up in 1 month.

## 2012-03-13 NOTE — Telephone Encounter (Signed)
error 

## 2012-03-28 NOTE — Assessment & Plan Note (Signed)
Change to lexapro as he feels sertraline has not been helpful and he recalls now sertraline being minimally effective in past.

## 2012-04-17 ENCOUNTER — Ambulatory Visit (INDEPENDENT_AMBULATORY_CARE_PROVIDER_SITE_OTHER): Payer: 59 | Admitting: Family Medicine

## 2012-04-17 ENCOUNTER — Encounter: Payer: Self-pay | Admitting: Family Medicine

## 2012-04-17 VITALS — BP 120/72 | HR 77 | Temp 98.1°F | Ht 71.0 in | Wt 170.2 lb

## 2012-04-17 DIAGNOSIS — F411 Generalized anxiety disorder: Secondary | ICD-10-CM

## 2012-04-17 MED ORDER — VENLAFAXINE HCL ER 75 MG PO CP24: ORAL_CAPSULE | ORAL | Status: DC

## 2012-04-17 NOTE — Assessment & Plan Note (Signed)
Improved control on lexapro 20 mg daily but only 20 %. Cannot increase further. Offered referral to Psychiatrist, pt refused. Wishes to try different med.  Will try venlafaxine but given pt is very insensitive to med will titrate up sooner rather than later if tolerated.  Follow up in 1 month.

## 2012-04-17 NOTE — Progress Notes (Signed)
  Subjective:    Patient ID: Shawn Lam, male    DOB: July 28, 1950, 62 y.o.   MRN: [id number]  HPI  62 year old male presents for follow up anxiety.  He has been on lexapro for 1 months. He reports  That he feels 20% better. Decreased anxiety. Sleeping well. No depression.  No SI, no HI. He feels it is working better than when on buspar.  Sertraline didn't help in past, buspar didn't help. Never tried effexor or venlafaxine.  Review of Systems  Constitutional: Negative for fever and fatigue.  Respiratory: Negative for cough and shortness of breath.   Cardiovascular: Negative for chest pain.  Gastrointestinal: Negative for abdominal pain.  Psychiatric/Behavioral: Negative for confusion and dysphoric mood. The patient is nervous/anxious. The patient is not hyperactive.        Objective:   Physical Exam        Assessment & Plan:

## 2012-04-17 NOTE — Patient Instructions (Addendum)
Stop lexapro.  Start venlafaxine 75 mg daily, if tolerating but no change can try to increase to 150 mg daily. Follow up in 1 month depression.

## 2012-04-30 ENCOUNTER — Other Ambulatory Visit: Payer: Self-pay | Admitting: Family Medicine

## 2012-05-08 ENCOUNTER — Encounter: Payer: Self-pay | Admitting: Family Medicine

## 2012-05-08 ENCOUNTER — Ambulatory Visit (INDEPENDENT_AMBULATORY_CARE_PROVIDER_SITE_OTHER): Payer: 59 | Admitting: Family Medicine

## 2012-05-08 ENCOUNTER — Other Ambulatory Visit: Payer: Self-pay | Admitting: Family Medicine

## 2012-05-08 VITALS — BP 112/62 | HR 83 | Temp 98.1°F | Wt 169.0 lb

## 2012-05-08 DIAGNOSIS — F411 Generalized anxiety disorder: Secondary | ICD-10-CM

## 2012-05-08 MED ORDER — SERTRALINE HCL 50 MG PO TABS: 100.0000 mg | ORAL_TABLET | Freq: Every day | ORAL | Status: DC

## 2012-05-08 NOTE — Patient Instructions (Addendum)
Decrease to lexapro 20 mg daily for 1 week.  If not feeling as though you have continued benefit Then start sertraline 100 mg daily. Can increase to 150 mg every week if not noticing benefit. Follow up in 1 month.

## 2012-05-08 NOTE — Progress Notes (Signed)
  Subjective:    Patient ID: Shawn Lam, male    DOB: [birth year], 62 y.o.   MRN: [id number]  HPI  62 year old male presents for 1 month follow up anxiety.  Seen last 1/7 for poorly controled anxiety on lexapro max. At that visit we started venlafaxine 75 mg daily with instructions to titrate it up if needed.  Today pt reports.[name].he was unable to take this med. It made him more agitated. He took it for 3 weeks.  He has been taking lexapro 2 tabs of 20 mg daily x 1 week. He states he feel 80% better on this dose.  No signs of seratonin syndrome.   Review of Systems  Constitutional: Negative for fever and fatigue.  HENT: Negative for ear pain.   Eyes: Negative for pain.  Respiratory: Negative for shortness of breath.   Cardiovascular: Negative for chest pain.       Objective:   Physical Exam  Constitutional: Vital signs are normal. He appears well-developed and well-nourished.  HENT:  Head: Normocephalic.  Right Ear: Hearing normal.  Left Ear: Hearing normal.  Nose: Nose normal.  Mouth/Throat: Oropharynx is clear and moist and mucous membranes are normal.  Neck: Trachea normal. Carotid bruit is not present. No mass and no thyromegaly present.  Cardiovascular: Normal rate, regular rhythm and normal pulses.  Exam reveals no gallop, no distant heart sounds and no friction rub.   No murmur heard.      No peripheral edema  Pulmonary/Chest: Effort normal and breath sounds normal. No respiratory distress.  Skin: Skin is warm, dry and intact. No rash noted.  Psychiatric: He has a normal mood and affect. His speech is normal and behavior is normal. Thought content normal.          Assessment & Plan:

## 2012-05-08 NOTE — Assessment & Plan Note (Addendum)
I do not feel he can safely continue to take such a high dose of lexapro. Significant risk of CV SE.  We never maxed out serttraline which effects seratonin as well. I will have him decrease lexapro to 20 mg daily x 1 week to see if maybe it has just taken this much time to have a benefit if not noting as much benefit on 20 mg sertraline then start sertraline 100 mg daily. If not noting benefit increase to 150-200 mg daily. Close follow up in 1 month.

## 2012-05-09 NOTE — Telephone Encounter (Signed)
This has been taken care of in refill encounter

## 2012-05-18 ENCOUNTER — Ambulatory Visit: Payer: 59 | Admitting: Family Medicine

## 2012-05-30 ENCOUNTER — Encounter: Payer: Self-pay | Admitting: Family Medicine

## 2012-05-31 ENCOUNTER — Other Ambulatory Visit: Payer: Self-pay | Admitting: Family Medicine

## 2012-05-31 MED ORDER — SERTRALINE HCL 100 MG PO TABS: 200.0000 mg | ORAL_TABLET | Freq: Every day | ORAL | Status: DC

## 2012-06-05 ENCOUNTER — Encounter: Payer: Self-pay | Admitting: Family Medicine

## 2012-06-07 ENCOUNTER — Ambulatory Visit: Payer: 59 | Admitting: Family Medicine

## 2012-07-28 ENCOUNTER — Other Ambulatory Visit: Payer: Self-pay | Admitting: Family Medicine

## 2012-08-07 ENCOUNTER — Encounter: Payer: Self-pay | Admitting: Family Medicine

## 2012-09-12 ENCOUNTER — Encounter: Payer: Self-pay | Admitting: Family Medicine

## 2012-10-24 ENCOUNTER — Other Ambulatory Visit: Payer: Self-pay | Admitting: Family Medicine

## 2012-11-20 ENCOUNTER — Telehealth: Payer: Self-pay

## 2012-11-20 NOTE — Telephone Encounter (Signed)
Pt has productive cough with green phlegm for 1 week, did have fever initially but no fever now; no SOB or CP and no wheezing. Pt has tried OTC cough med and mucinex with no relief. Pt scheduled appt with Dr Diona Browner 11/21/12 at 11:15 am.

## 2012-11-20 NOTE — Telephone Encounter (Signed)
Agree with appt. 

## 2012-11-21 ENCOUNTER — Encounter: Payer: Self-pay | Admitting: Family Medicine

## 2012-11-21 ENCOUNTER — Ambulatory Visit (INDEPENDENT_AMBULATORY_CARE_PROVIDER_SITE_OTHER): Payer: 59 | Admitting: Family Medicine

## 2012-11-21 VITALS — BP 136/70 | HR 82 | Temp 98.0°F | Wt 164.0 lb

## 2012-11-21 DIAGNOSIS — J189 Pneumonia, unspecified organism: Secondary | ICD-10-CM

## 2012-11-21 MED ORDER — HYDROCODONE-HOMATROPINE 5-1.5 MG/5ML PO SYRP: 5.0000 mL | ORAL_SOLUTION | Freq: Every evening | ORAL | Status: DC | PRN

## 2012-11-21 MED ORDER — DOXYCYCLINE HYCLATE 100 MG PO TABS: 100.0000 mg | ORAL_TABLET | Freq: Two times a day (BID) | ORAL | Status: DC

## 2012-11-21 NOTE — Patient Instructions (Addendum)
Complete antibiotics.  Cough suppressant at night and robitussin during the day. Call if not improving some in 48-72 hours as expected.  Go to ER if short of breath severely.

## 2012-11-21 NOTE — Progress Notes (Signed)
  Subjective:    Patient ID: Shawn Lam, male    DOB: [birth year], 62 y.o.   MRN: [id number]  HPI  62 year old male with history of CAD presents with  1 week of body ache, fatigue, fever 102 last Wednesday. No fever since. Cough, keeping up at night, congestion, no sinus pain, no ear pain, no sore throat.  No SOB, no wheeze. Not getting better or worse.   Has been taking mucinex and cough med without relief.  Ibuprofen as well for fever and headache.   Review of Systems  Constitutional: Positive for fever and fatigue.  HENT: Positive for neck stiffness. Negative for ear pain and neck pain.   Eyes: Negative for pain.  Respiratory: Positive for cough. Negative for shortness of breath and wheezing.   Cardiovascular: Negative for chest pain, palpitations and leg swelling.  Gastrointestinal: Negative for abdominal pain.  Musculoskeletal: Positive for myalgias.  Skin: Negative for rash.       Objective:   Physical Exam  Constitutional: Vital signs are normal. He appears well-developed and well-nourished.  Non-toxic appearance. He does not appear ill. No distress.  HENT:  Head: Normocephalic and atraumatic.  Right Ear: Hearing, tympanic membrane, external ear and ear canal normal. No tenderness. No foreign bodies. Tympanic membrane is not retracted and not bulging.  Left Ear: Hearing, tympanic membrane, external ear and ear canal normal. No tenderness. No foreign bodies. Tympanic membrane is not retracted and not bulging.  Nose: Nose normal. No mucosal edema or rhinorrhea. Right sinus exhibits no maxillary sinus tenderness and no frontal sinus tenderness. Left sinus exhibits no maxillary sinus tenderness and no frontal sinus tenderness.  Mouth/Throat: Uvula is midline, oropharynx is clear and moist and mucous membranes are normal. Normal dentition. No dental caries. No oropharyngeal exudate or tonsillar abscesses.  Eyes: Conjunctivae, EOM and lids are normal. Pupils are equal, round,  and reactive to light. Lids are everted and swept, no foreign bodies found.  Neck: Trachea normal, normal range of motion and phonation normal. Neck supple. Carotid bruit is not present. No mass and no thyromegaly present.  Cardiovascular: Normal rate, regular rhythm, S1 normal, S2 normal, normal heart sounds, intact distal pulses and normal pulses.  Exam reveals no gallop.   No murmur heard. Pulmonary/Chest: Effort normal. No respiratory distress. He has no wheezes. He has no rhonchi. He has rales in the left lower field.  Abdominal: Soft. Normal appearance and bowel sounds are normal. There is no hepatosplenomegaly. There is no tenderness. There is no rebound, no guarding and no CVA tenderness. No hernia.  Neurological: He is alert. He has normal reflexes.  Skin: Skin is warm, dry and intact. No rash noted.  Psychiatric: He has a normal mood and affect. His speech is normal and behavior is normal. Judgment normal.          Assessment & Plan:

## 2012-11-21 NOTE — Assessment & Plan Note (Signed)
Treat wth doxy given interactions of other antibiotics with doxepin as well as for broader coverage of atypicals.  Follow up if not improving as expected.

## 2013-01-04 ENCOUNTER — Other Ambulatory Visit: Payer: Self-pay | Admitting: Family Medicine

## 2013-01-23 ENCOUNTER — Telehealth: Payer: Self-pay | Admitting: Family Medicine

## 2013-01-23 DIAGNOSIS — R7309 Other abnormal glucose: Secondary | ICD-10-CM

## 2013-01-23 DIAGNOSIS — Z125 Encounter for screening for malignant neoplasm of prostate: Secondary | ICD-10-CM

## 2013-01-23 DIAGNOSIS — E785 Hyperlipidemia, unspecified: Secondary | ICD-10-CM

## 2013-01-23 NOTE — Telephone Encounter (Signed)
Message copied by Jinny Sanders on Wed Jan 23, 2013 11:51 PM ------      Message from: Ellamae Sia      Created: Wed Jan 16, 2013  5:23 PM      Regarding: Lab orders for Thursday, 10.16.14       Patient is scheduled for CPX labs, please order future labs, Thanks , Terri       ------

## 2013-01-24 ENCOUNTER — Other Ambulatory Visit (INDEPENDENT_AMBULATORY_CARE_PROVIDER_SITE_OTHER): Payer: 59

## 2013-01-24 DIAGNOSIS — E785 Hyperlipidemia, unspecified: Secondary | ICD-10-CM

## 2013-01-24 DIAGNOSIS — Z125 Encounter for screening for malignant neoplasm of prostate: Secondary | ICD-10-CM

## 2013-01-24 DIAGNOSIS — R7309 Other abnormal glucose: Secondary | ICD-10-CM

## 2013-01-24 LAB — LIPID PANEL
Cholesterol: 164 mg/dL (ref 0–200)
LDL Cholesterol: 80 mg/dL (ref 0–99)
Triglycerides: 35 mg/dL (ref 0.0–149.0)
VLDL: 7 mg/dL (ref 0.0–40.0)

## 2013-01-24 LAB — COMPREHENSIVE METABOLIC PANEL
Albumin: 3.4 g/dL — ABNORMAL LOW (ref 3.5–5.2)
Alkaline Phosphatase: 41 U/L (ref 39–117)
BUN: 26 mg/dL — ABNORMAL HIGH (ref 6–23)
Calcium: 8.9 mg/dL (ref 8.4–10.5)
Creatinine, Ser: 1.6 mg/dL — ABNORMAL HIGH (ref 0.4–1.5)
Glucose, Bld: 94 mg/dL (ref 70–99)
Potassium: 4.6 mEq/L (ref 3.5–5.1)

## 2013-01-24 LAB — PSA: PSA: 0.55 ng/mL (ref 0.10–4.00)

## 2013-01-26 ENCOUNTER — Other Ambulatory Visit: Payer: Self-pay | Admitting: Family Medicine

## 2013-01-27 NOTE — Telephone Encounter (Signed)
Last office visit 11/21/2012.   Has CPX scheduled 02/01/2013. Ok to refill?

## 2013-01-28 DIAGNOSIS — H469 Unspecified optic neuritis: Secondary | ICD-10-CM | POA: Insufficient documentation

## 2013-01-29 ENCOUNTER — Encounter: Payer: Self-pay | Admitting: Family Medicine

## 2013-01-29 DIAGNOSIS — H53419 Scotoma involving central area, unspecified eye: Secondary | ICD-10-CM | POA: Insufficient documentation

## 2013-01-31 ENCOUNTER — Encounter: Payer: Self-pay | Admitting: Family Medicine

## 2013-01-31 ENCOUNTER — Telehealth: Payer: Self-pay | Admitting: Radiology

## 2013-01-31 ENCOUNTER — Telehealth: Payer: Self-pay | Admitting: Family Medicine

## 2013-01-31 ENCOUNTER — Ambulatory Visit (INDEPENDENT_AMBULATORY_CARE_PROVIDER_SITE_OTHER): Payer: 59 | Admitting: Family Medicine

## 2013-01-31 VITALS — BP 120/68 | HR 76 | Temp 98.0°F | Ht 71.0 in | Wt 167.0 lb

## 2013-01-31 DIAGNOSIS — R319 Hematuria, unspecified: Secondary | ICD-10-CM

## 2013-01-31 DIAGNOSIS — R7309 Other abnormal glucose: Secondary | ICD-10-CM

## 2013-01-31 DIAGNOSIS — Z23 Encounter for immunization: Secondary | ICD-10-CM

## 2013-01-31 DIAGNOSIS — H53413 Scotoma involving central area, bilateral: Secondary | ICD-10-CM

## 2013-01-31 DIAGNOSIS — F411 Generalized anxiety disorder: Secondary | ICD-10-CM

## 2013-01-31 DIAGNOSIS — I2510 Atherosclerotic heart disease of native coronary artery without angina pectoris: Secondary | ICD-10-CM

## 2013-01-31 DIAGNOSIS — N179 Acute kidney failure, unspecified: Secondary | ICD-10-CM

## 2013-01-31 DIAGNOSIS — H53419 Scotoma involving central area, unspecified eye: Secondary | ICD-10-CM

## 2013-01-31 DIAGNOSIS — D509 Iron deficiency anemia, unspecified: Secondary | ICD-10-CM | POA: Insufficient documentation

## 2013-01-31 DIAGNOSIS — E785 Hyperlipidemia, unspecified: Secondary | ICD-10-CM

## 2013-01-31 DIAGNOSIS — D649 Anemia, unspecified: Secondary | ICD-10-CM | POA: Insufficient documentation

## 2013-01-31 LAB — IBC PANEL
Iron: 15 ug/dL — ABNORMAL LOW (ref 42–165)
Saturation Ratios: 3.5 % — ABNORMAL LOW (ref 20.0–50.0)
Transferrin: 307.6 mg/dL (ref 212.0–360.0)

## 2013-01-31 LAB — BASIC METABOLIC PANEL
Calcium: 8.6 mg/dL (ref 8.4–10.5)
Creatinine, Ser: 1.7 mg/dL — ABNORMAL HIGH (ref 0.4–1.5)
GFR: 44.13 mL/min — ABNORMAL LOW (ref >60.00)
Glucose, Bld: 99 mg/dL (ref 70–99)
Potassium: 4.3 mEq/L (ref 3.5–5.1)
Sodium: 141 mEq/L (ref 135–145)

## 2013-01-31 LAB — CBC WITH DIFFERENTIAL/PLATELET
Basophils Absolute: 0.1 10*3/uL (ref 0.0–0.1)
Basophils Relative: 1.4 % (ref 0.0–3.0)
Eosinophils Absolute: 0.4 10*3/uL (ref 0.0–0.7)
Eosinophils Relative: 3.9 % (ref 0.0–5.0)
Lymphs Abs: 1.8 10*3/uL (ref 0.7–4.0)
MCV: 73.4 fl — ABNORMAL LOW (ref 78.0–100.0)
Monocytes Absolute: 0.6 10*3/uL (ref 0.1–1.0)
Neutrophils Relative %: 68.6 % (ref 43.0–77.0)
Platelets: 289 10*3/uL (ref 150.0–400.0)
RBC: 3.17 Mil/uL — ABNORMAL LOW (ref 4.22–5.81)
RDW: 19.8 % — ABNORMAL HIGH (ref 11.5–14.6)
WBC: 9.1 10*3/uL (ref 4.5–10.5)

## 2013-01-31 LAB — URINALYSIS, ROUTINE W REFLEX MICROSCOPIC
Leukocytes, UA: NEGATIVE
Nitrite: POSITIVE
Specific Gravity, Urine: 1.02 (ref 1.000–1.030)
pH: 5.5 (ref 5.0–8.0)

## 2013-01-31 MED ORDER — DOXYCYCLINE HYCLATE 100 MG PO TABS: 100.0000 mg | ORAL_TABLET | Freq: Two times a day (BID) | ORAL | Status: DC

## 2013-01-31 MED ORDER — ALPRAZOLAM 0.5 MG PO TABS: 0.5000 mg | ORAL_TABLET | Freq: Two times a day (BID) | ORAL | Status: DC | PRN

## 2013-01-31 MED ORDER — FERROUS SULFATE 325 (65 FE) MG PO TABS: 325.0000 mg | ORAL_TABLET | Freq: Three times a day (TID) | ORAL | Status: DC

## 2013-01-31 NOTE — Telephone Encounter (Signed)
Noted.  Hemoglobin has dropped further in past few days with continued hematuria. Discussed case with Dr. Radford Pax... He has very stable CAD, no issues in last 7 years. Okay to holds ASA and plavix. Also she stated despite CAD... Given no CP... No clear indication for transfusion.   Discussed in detail with pt:  He is asymptomatic from anemia. Start iron three times a day. Rx sent. Hold plavix and aspirin.  He will let me know if he has any chest pain. Go to ER if severe chest pain, SOB, syncope or heavy bleeding.  I will call with further results tommorow.

## 2013-01-31 NOTE — Telephone Encounter (Signed)
Called pt again given more results back tonight. Kidney function is abnormal. Blood large in urine... No clear infection. Verified with lab no casts seen.  Recommend pushing fluids. Will refer ASAP to nephrologist for further eval.   pt agreeable.

## 2013-01-31 NOTE — Telephone Encounter (Signed)
[facility] called a critical HGB - 7.2, HCT - 23.3. Results given to Dr Diona Browner

## 2013-01-31 NOTE — Patient Instructions (Addendum)
Stop at lab on way out. We will call with lab results and possible referral.  Start alprazolam as needed for anxiety.

## 2013-01-31 NOTE — Progress Notes (Signed)
The patient is here for annual wellness exam and preventative care.   He has been seeing eye specialist at [facility] (Dr. Hassell Done) for bilateral scotoma in last 6 months In middle of work up. Has MRI brain scheduled 10/30. Recent labs have showed Hg of 7.7!!!! He felt this could possibly cause/ contribute to eye symptoms. B12, sed rate nml. Recent kidney function decrease. Cr 1.6. No dehydration, no vomiting, no diarrhea, no diueretic.  No dysuria. No family history of kidney issues, no personal kidney issues. Yesterday mild pain in right CVA.  He has never been anemia in the past. Microcytic with MCV.. 74. He does have blood in urine intermittently,red colored urine in last 24 hours... Has not done it in a long time. (saw urology (Dr. Sue Lush) last year, had neg cystoscopy etc. Told nothing to worry about. No blood in stool. Nml colonoscopy earlier this year. ON plavix and aspirin.   He is fatigued x 6 month, No CP, no SOB. Did have syncope in early spring did not come to the doctor.  Anxiety, control on 200 mg daily of sertraline. Helping minimally given recent health issue. No depression, no sadness. Does have frequent waking at night.  Has never seen a psychiatrist.  Never been on other meds for mood in past.  No SI.  No increase in stress.   Elevated Cholesterol:At goal <70 on 80 of lipitor.  Lab Results  Component Value Date   CHOL 164 01/24/2013   HDL 77.50 01/24/2013   LDLCALC 80 01/24/2013   LDLDIRECT 57 06/22/2009   TRIG 35.0 01/24/2013   CHOLHDL 2 01/24/2013   Muscle aches: None  Diet compliance: Good.  Exercise: Jogging daily 11 miles.  Other complaints: CAD   CAD, hx of 2 stents in 2006.Clarnce Flock Dr. Radford Pax, stable exam in 06/2012 In 2012 had been having fatigue,some SOB with getting out of car, occ lightheadded .[name] Has had to stop some with exercise due to feeling "cold sweat" presyncopal.  Had stress ECHO test that per pt showed decreased blood pumping.[name] No changes  made.  EKG stable  2012 we did labs for fatigue: nml thyroid, cbc, testosterone, B12 was low and supplement was started.   Wt Readings from Last 3 Encounters:  01/31/13 167 lb (75.751 kg)  11/21/12 164 lb (74.39 kg)  05/08/12 169 lb (76.658 kg)    Prediabetes: improved some.   Review of Systems  Constitutional: Negative for fever, fatigue and unexpected weight change.  HENT: Negative for ear pain, congestion, sore throat, rhinorrhea, trouble swallowing and postnasal drip.  Eyes: Negative for pain.  Respiratory: Negative for cough, shortness of breath and wheezing.  Cardiovascular: Negative for chest pain, palpitations and leg swelling.  Gastrointestinal: Negative for nausea, abdominal pain, diarrhea, constipation and blood in stool.  Genitourinary: Negative for dysuria, urgency. discharge, penile swelling, scrotal swelling, difficulty urinating, penile pain and testicular pain.   Skin: Negative for rash.  Neurological: Negative for syncope, weakness, light-headedness, numbness and headaches.  Psychiatric/Behavioral: Negative for behavioral problems and dysphoric mood. The patient is nervous/anxious.  Objective:   Physical Exam  Constitutional: He appears well-developed and well-nourished. Non-toxic appearance. He does not appear ill. No distress.  HENT:  Head: Normocephalic and atraumatic.  Right Ear: Hearing, tympanic membrane, external ear and ear canal normal.  Left Ear: Hearing, tympanic membrane, external ear and ear canal normal.  Nose: Nose normal.  Mouth/Throat: Uvula is midline, oropharynx is clear and moist and mucous membranes are normal.  Eyes: Conjunctivae normal, EOM  and lids are normal. Pupils are equal, round, and reactive to light. No foreign bodies found.  Neck: Trachea normal, normal range of motion and phonation normal. Neck supple. Carotid bruit is not present. No mass and no thyromegaly present.  Cardiovascular: Normal rate, regular rhythm, S1 normal, S2  normal, intact distal pulses and normal pulses. Exam reveals no gallop.  No murmur heard.  Pulmonary/Chest: Breath sounds normal. He has no wheezes. He has no rhonchi. He has no rales.  Abdominal: Soft. Normal appearance and bowel sounds are normal. There is no hepatosplenomegaly. There is no tenderness. There is no rebound, no guarding and no CVA tenderness. No hernia. Hernia confirmed negative in the right inguinal area and confirmed negative in the left inguinal area.  Genitourinary: Testes normal and penis normal. Right testis shows no mass and no tenderness. Left testis shows no mass and no tenderness. No paraphimosis or penile tenderness.  Lymphadenopathy:  He has no cervical adenopathy.  Right: No inguinal adenopathy present.  Left: No inguinal adenopathy present.  Neurological: He is alert. He has normal strength and normal reflexes. No cranial nerve deficit or sensory deficit. Gait normal.  Skin: Skin is warm, dry and intact. No rash noted.  Psychiatric: He has a normal mood and affect. His speech is normal and behavior is normal. Judgment normal.  Assessment & Plan:   The patient's preventative maintenance and recommended screening tests for an annual wellness exam were reviewed in full today.  Brought up to date unless services declined.  Counselled on the importance of diet, exercise, and its role in overall health and mortality.  The patient's FH and SH was reviewed, including their home life, tobacco status, and drug and alcohol status.   Colon cancer screening: Colonoscopy 08/01/2007 nml, repeat colonoscopy Dr. Penelope Coop 2014 nml.

## 2013-01-31 NOTE — Telephone Encounter (Signed)
Error

## 2013-02-01 MED ORDER — FERROUS SULFATE 325 (65 FE) MG PO TABS: 325.0000 mg | ORAL_TABLET | Freq: Three times a day (TID) | ORAL | Status: DC

## 2013-02-04 ENCOUNTER — Telehealth: Payer: Self-pay | Admitting: Cardiology

## 2013-02-04 NOTE — Assessment & Plan Note (Signed)
Most likely due to ongoing hematuria. Will eval iron panel and recheck hemoglobin.  After appointment: Discussed possible transfusion need with cardiologist, Dr. Radford Pax ... She agrees as pt is asymptomatic ( no chest pain) no indicaiton for transfucsion at this time.  Will have pt stop plavix and aspirin... Approved by Dr. Radford Pax as well.

## 2013-02-04 NOTE — Assessment & Plan Note (Signed)
Poor control on high dose sertraline. Will start alprazolam prn for now. If not improving consider change of medication. Offered referral to psychiatrist..the patient and wife refused.

## 2013-02-04 NOTE — Telephone Encounter (Signed)
Per pt call - states last Thursday his Plavix was stopped by Dr Bevelyn Ngo d/t blood in his urine.  He saw his nephrologist today who said it is OK for him to take Plavix.  Pt wants to know if he should restart it.  Advised I will forward to MD for review and orders.  He is aware he will be called back.

## 2013-02-04 NOTE — Telephone Encounter (Signed)
His last PCI was in 2007 so ok to stay off Plavix but needs to be on a baby ASA if ok with nephrology and Urology

## 2013-02-04 NOTE — Telephone Encounter (Signed)
New Problem  Pt requests a call back to discuss restarting Plavix// Please call

## 2013-02-04 NOTE — Assessment & Plan Note (Signed)
Asymptomatic at this time 

## 2013-02-04 NOTE — Assessment & Plan Note (Signed)
Resolved

## 2013-02-04 NOTE — Assessment & Plan Note (Signed)
New finding.. Last year with hematuria.. Normal renal function.  No casts seen in urine.  Will refer to nephrology for eval for nephritic syndrome.  Pt has no evidence of swelling.

## 2013-02-04 NOTE — Assessment & Plan Note (Signed)
Eval by uro last year.. No clear  urologic cause seen, self resolved but has now recurred along with new ARF. Will stop palvix and ASA as stated above.

## 2013-02-05 ENCOUNTER — Encounter: Payer: Self-pay | Admitting: Family Medicine

## 2013-02-05 ENCOUNTER — Encounter: Payer: Self-pay | Admitting: *Deleted

## 2013-02-05 NOTE — Telephone Encounter (Signed)
Follow up     Returned Danielle's call

## 2013-02-05 NOTE — Telephone Encounter (Signed)
Left message to call back  

## 2013-02-05 NOTE — Telephone Encounter (Signed)
Pt is aware it is ok to be off plavix but that he needs to be on a baby ASA if ok with Nephrologist and urologist.

## 2013-02-07 ENCOUNTER — Other Ambulatory Visit: Payer: Self-pay | Admitting: General Surgery

## 2013-02-07 ENCOUNTER — Ambulatory Visit (INDEPENDENT_AMBULATORY_CARE_PROVIDER_SITE_OTHER): Payer: 59 | Admitting: Cardiology

## 2013-02-07 ENCOUNTER — Encounter: Payer: Self-pay | Admitting: Cardiology

## 2013-02-07 VITALS — BP 136/74 | HR 76 | Ht 72.0 in | Wt 167.0 lb

## 2013-02-07 DIAGNOSIS — E785 Hyperlipidemia, unspecified: Secondary | ICD-10-CM

## 2013-02-07 DIAGNOSIS — I2510 Atherosclerotic heart disease of native coronary artery without angina pectoris: Secondary | ICD-10-CM

## 2013-02-07 DIAGNOSIS — I951 Orthostatic hypotension: Secondary | ICD-10-CM

## 2013-02-07 MED ORDER — FLUDROCORTISONE ACETATE 0.1 MG PO TABS: 0.1000 mg | ORAL_TABLET | Freq: Every day | ORAL | Status: DC

## 2013-02-07 MED ORDER — ASPIRIN EC 81 MG PO TBEC: 81.0000 mg | DELAYED_RELEASE_TABLET | Freq: Every day | ORAL | Status: DC

## 2013-02-07 NOTE — Progress Notes (Signed)
[place], [address] [place], [place]  [address] Phone: [phone] Fax:  [phone]  Date:  02/07/2013   ID:  Shawn Lam, DOB 1950/09/21, MRN [id number]  PCP:  [name], MD  Cardiologist:  Fransico Him, MD     History of Present Illness: Shawn Lam is a 62 y.o. male with a history of CAD/orthostatic hypotension and dyslipidemia who presents today for followup of his CAD/dyslipidemia and orthostatic hypotension.  He is doing well.  He denies any chest pain, SOB, DOE, LE edema, palpitations or syncope.  His dizziness has significantly improved on Florinef.   Wt Readings from Last 3 Encounters:  02/07/13 167 lb (75.751 kg)  01/31/13 167 lb (75.751 kg)  11/21/12 164 lb (74.39 kg)     Past Medical History  Diagnosis Date  . Anxiety   . GERD (gastroesophageal reflux disease)   . HIV infection   . Urticaria   . Rosacea   . Insomnia   . Hematuria   . CAD (coronary artery disease) 06/2005    PCI of LAD and RCA  . Orthostatic hypotension     negative tilt table  . Hyperlipidemia   . Asymptomatic LV dysfunction     mild with EF 48%    Current Outpatient Prescriptions  Medication Sig Dispense Refill  . ALPRAZolam (XANAX) 0.5 MG tablet Take 1 tablet (0.5 mg total) by mouth 2 (two) times daily as needed for sleep.  60 tablet  0  . atorvastatin (LIPITOR) 80 MG tablet TAKE 1 TABLET (80 MG TOTAL) BY MOUTH DAILY.  90 tablet  0  . doxepin (SINEQUAN) 50 MG capsule TAKE 2 CAPSULES BY MOUTH AT BEDTIME  180 capsule  0  . doxycycline (VIBRA-TABS) 100 MG tablet Take 1 tablet (100 mg total) by mouth 2 (two) times daily.  20 tablet  0  . escitalopram (LEXAPRO) 20 MG tablet TAKE 1 TABLET (20 MG TOTAL) BY MOUTH DAILY.  30 tablet  1  . ferrous sulfate 325 (65 FE) MG tablet Take 1 tablet (325 mg total) by mouth 3 (three) times daily with meals.  90 tablet  3  . hydrOXYzine (ATARAX/VISTARIL) 25 MG tablet Take 50 mg by mouth at bedtime as needed.        [name] levocetirizine (XYZAL) 5  MG tablet Take 5 mg by mouth every evening.        . sertraline (ZOLOFT) 100 MG tablet Take 2 tablets (200 mg total) by mouth daily.  180 tablet  3  . aspirin 325 MG tablet Take 325 mg by mouth daily.        . clopidogrel (PLAVIX) 75 MG tablet Take 75 mg by mouth daily.         No current facility-administered medications for this visit.    Allergies:    Allergies  Allergen Reactions  . Codeine Nausea Only    Social History:  The patient  reports that he has never smoked. He has never used smokeless tobacco. He reports that he drinks about 2.4 ounces of alcohol per week. He reports that he does not use illicit drugs.   Family History:  The patient's family history includes Cancer in his father; Coronary artery disease in his mother; Diabetes in his brother; Heart attack in his mother; Heart disease in his mother; Hypertension in his mother.   ROS:  Please see the history of present illness.      All other systems reviewed and negative.   PHYSICAL EXAM:  VS:  BP 136/74  Pulse 76  Ht 6' (1.829 m)  Wt 167 lb (75.751 kg)  BMI 22.64 kg/m2  SpO2 99% Well nourished, well developed, in no acute distress HEENT: normal Neck: no JVD Cardiac:  normal S1, S2; RRR; no murmur Lungs:  clear to auscultation bilaterally, no wheezing, rhonchi or rales Abd: soft, nontender, no hepatomegaly Ext: no edema Skin: warm and dry Neuro:  CNs 2-12 intact, no focal abnormalities noted       ASSESSMENT AND PLAN:  1. Orthostatic hypotension improved on Florinef  - continue Florinef 2. ASCAD stable with no angina   - I had recommended that he stop Plavix due to recent hematuria.  Urology said he was stable to continue ASA/Plavix.  I have recommended that he decrease his ASA to 81mg  daily. He wants to stay on Plavix which I am fine with as long as he has no further hematuria. 3. Hematuria with ASA/Plavix held temporarily 4. Stage 2 CKD 5. Dyslipidemia  - continue atorvastati  - I will get a copy of his  last lipids  Followup with me in 6 months  Signed, Fransico Him, MD 02/07/2013 2:38 PM

## 2013-02-07 NOTE — Patient Instructions (Signed)
Decrease Aspirin to 81 MG daily  Your physician wants you to follow-up in: 6 months with Dr. Mallie Snooks will receive a reminder letter in the mail two months in advance. If you don't receive a letter, please call our office to schedule the follow-up appointment.

## 2013-02-08 ENCOUNTER — Ambulatory Visit: Payer: Self-pay | Admitting: Nephrology

## 2013-02-28 ENCOUNTER — Encounter: Payer: Self-pay | Admitting: Family Medicine

## 2013-02-28 MED ORDER — ALPRAZOLAM 0.5 MG PO TABS: 0.5000 mg | ORAL_TABLET | Freq: Two times a day (BID) | ORAL | Status: DC | PRN

## 2013-03-01 NOTE — Telephone Encounter (Signed)
Called to [facility]

## 2013-04-01 ENCOUNTER — Other Ambulatory Visit: Payer: Self-pay | Admitting: Family Medicine

## 2013-05-07 ENCOUNTER — Other Ambulatory Visit: Payer: Self-pay | Admitting: Cardiology

## 2013-05-26 ENCOUNTER — Other Ambulatory Visit: Payer: Self-pay | Admitting: Family Medicine

## 2013-06-14 ENCOUNTER — Other Ambulatory Visit: Payer: Self-pay | Admitting: Family Medicine

## 2013-06-14 ENCOUNTER — Telehealth: Payer: Self-pay | Admitting: *Deleted

## 2013-06-14 DIAGNOSIS — R911 Solitary pulmonary nodule: Secondary | ICD-10-CM

## 2013-06-14 NOTE — Telephone Encounter (Signed)
Mr. Bonser called. States he saw Dr. Candiss Norse today and he was going over his CT report that he had done in October 2014 and was ask if anyone had followed up on the spot in his lower left lobe.  Mr. Flamenco states that Dr. Candiss Norse offered to schedule a Chest CT for him but he wanted Dr. Diona Browner to review the report and see what she would recommend.  He doesn't really want to do a CT if he doesn't have to due to cost. I advised Mr. Kelley I would have Dr. Keturah Barre office fax [place] a copy of report and I would have Dr. Diona Browner review.  Advised I would call him back with her recommendations.  CT report placed in Dr. Rometta Emery in box for review.

## 2013-06-14 NOTE — Telephone Encounter (Signed)
Notify pt CT ab review. Lung nodule in LLL. He is nonsmoker.  I would agree with  recommendations of  CT chest to eval further to make sure no change and no other nodules.  Let me know if I need to order or if he plans to have Dr. Candiss Norse schedule.

## 2013-06-17 NOTE — Telephone Encounter (Signed)
Mr. Heichel notified as instructed by telephone.  He would like Dr. Diona Browner be the one to order and follow up on the CT.  Will forward to Dr. Diona Browner to place order to CT chest.

## 2013-06-20 ENCOUNTER — Ambulatory Visit (INDEPENDENT_AMBULATORY_CARE_PROVIDER_SITE_OTHER)
Admission: RE | Admit: 2013-06-20 | Discharge: 2013-06-20 | Disposition: A | Discharge status: Home / Self Care | Payer: 59 | Source: Ambulatory Visit | Attending: Family Medicine | Admitting: Family Medicine

## 2013-06-20 DIAGNOSIS — R911 Solitary pulmonary nodule: Secondary | ICD-10-CM

## 2013-06-21 ENCOUNTER — Other Ambulatory Visit: Payer: 59

## 2013-06-26 ENCOUNTER — Other Ambulatory Visit: Payer: Self-pay | Admitting: Family Medicine

## 2013-06-27 NOTE — Telephone Encounter (Signed)
Rx called in as prescribed 

## 2013-07-09 ENCOUNTER — Encounter: Payer: Self-pay | Admitting: Family Medicine

## 2013-07-16 NOTE — Telephone Encounter (Signed)
Can you get a copy of this from Dr. Candiss Norse?

## 2013-07-19 ENCOUNTER — Encounter: Payer: Self-pay | Admitting: Cardiology

## 2013-07-26 ENCOUNTER — Encounter: Payer: Self-pay | Admitting: Cardiology

## 2013-08-06 ENCOUNTER — Ambulatory Visit (INDEPENDENT_AMBULATORY_CARE_PROVIDER_SITE_OTHER): Payer: 59 | Admitting: Cardiology

## 2013-08-06 ENCOUNTER — Encounter: Payer: Self-pay | Admitting: Cardiology

## 2013-08-06 VITALS — BP 116/67 | Ht 72.0 in | Wt 160.0 lb

## 2013-08-06 DIAGNOSIS — I951 Orthostatic hypotension: Secondary | ICD-10-CM

## 2013-08-06 DIAGNOSIS — E785 Hyperlipidemia, unspecified: Secondary | ICD-10-CM

## 2013-08-06 DIAGNOSIS — I2510 Atherosclerotic heart disease of native coronary artery without angina pectoris: Secondary | ICD-10-CM

## 2013-08-06 NOTE — Patient Instructions (Signed)
Your physician recommends that you continue on your current medications as directed. Please refer to the Current Medication list given to you today.  Your physician has requested that you have an exercise stress myoview. For further information please visit [email or web address]. Please follow instruction sheet, as given.  Your physician wants you to follow-up in: 1 year with Dr Mallie Snooks will receive a reminder letter in the mail two months in advance. If you don't receive a letter, please call our office to schedule the follow-up appointment.

## 2013-08-06 NOTE — Addendum Note (Signed)
Addended by: Lily Kocher on: 08/06/2013 03:35 PM   Modules accepted: Orders

## 2013-08-06 NOTE — Progress Notes (Signed)
[place], [address] [place], [place]  [address] Phone: [phone] Fax:  [phone]  Date:  08/06/2013   ID:  Shawn Lam, DOB [birth year], MRN [id number]  PCP:  [name], MD  Cardiologist:  Fransico Him, MD     History of Present Illness: Shawn Lam is a 63 y.o. male with ahistory of ASCAD, dyslipidemia, mild LV dysfunction with EF 48% who presents today for followup.  He is doing well.  He denies any  SOB, DOE, LE edema, palpitations or syncope.  His dizziness is stable on Florinef.  He says that when he jogs he will get a sharp pain in his left chest that resoves with rests.  He also gets very dizzy and weak like his BP is dropping.    Wt Readings from Last 3 Encounters:  08/06/13 160 lb (72.576 kg)  02/07/13 167 lb (75.751 kg)  01/31/13 167 lb (75.751 kg)     Past Medical History  Diagnosis Date  . Anxiety   . GERD (gastroesophageal reflux disease)   . Urticaria   . Rosacea   . Insomnia   . Hematuria   . CAD (coronary artery disease) 06/2005    PCI of LAD and RCA  . Orthostatic hypotension     negative tilt table  . Hyperlipidemia   . Asymptomatic LV dysfunction     mild with EF 48%    Current Outpatient Prescriptions  Medication Sig Dispense Refill  . ALPRAZolam (XANAX) 0.5 MG tablet Take 0.5 mg by mouth 2 (two) times daily as needed for anxiety.      [name] aspirin EC 81 MG tablet Take 1 tablet (81 mg total) by mouth daily.  90 tablet  3  . atorvastatin (LIPITOR) 80 MG tablet TAKE 1 TABLET BY MOUTH DAILY.  90 tablet  1  . clopidogrel (PLAVIX) 75 MG tablet TAKE 1 TABLET BY MOUTH EVERY DAY  90 tablet  0  . enalapril (VASOTEC) 2.5 MG tablet       . ferrous sulfate 325 (65 FE) MG tablet TAKE 1 TABLET BY MOUTH 3 TIMES DAILY WITH MEALS.  90 tablet  5  . fludrocortisone (FLORINEF) 0.1 MG tablet Take 1 tablet (0.1 mg total) by mouth daily.  90 tablet  3  . FLUOCINOLONE ACETONIDE BODY 0.01 % OIL       . hydrOXYzine (ATARAX/VISTARIL) 25 MG tablet Take 50 mg by  mouth at bedtime as needed.        [name] omega-3 acid ethyl esters (LOVAZA) 1 G capsule       . sertraline (ZOLOFT) 100 MG tablet TAKE 2 TABLETS (200 MG TOTAL) BY MOUTH DAILY.  180 tablet  0   No current facility-administered medications for this visit.    Allergies:    Allergies  Allergen Reactions  . Codeine Nausea Only    Social History:  The patient  reports that he has never smoked. He has never used smokeless tobacco. He reports that he drinks about 2.4 ounces of alcohol per week. He reports that he does not use illicit drugs.   Family History:  The patient's family history includes Cancer in his father; Coronary artery disease in his mother; Diabetes in his brother; Heart attack in his mother; Heart disease in his mother; Hypertension in his mother.   ROS:  Please see the history of present illness.      All other systems reviewed and negative.   PHYSICAL EXAM: VS:  BP 116/67  Ht  6' (1.829 m)  Wt 160 lb (72.576 kg)  BMI 21.70 kg/m2 Well nourished, well developed, in no acute distress HEENT: normal Neck: no JVD Cardiac:  normal S1, S2; RRR; no murmur Lungs:  clear to auscultation bilaterally, no wheezing, rhonchi or rales Abd: soft, nontender, no hepatomegaly Ext: no edema Skin: warm and dry Neuro:  CNs 2-12 intact, no focal abnormalities noted  EKG:  NSR with no ST changes LVH by voltage  ASSESSMENT AND PLAN: 1.  ASCAD with wome atypical CP during running and weakness that is worrisome for possible angina - check stress myoview to rule out ischemia - continue ASA/Plavix 2.  Dyslipidemia - continue atorvastatin 3.  Orthostatic hypotension - continue Florinef  Followup with me in 1 year  Signed, Fransico Him, MD 08/06/2013 3:01 PM

## 2013-08-07 ENCOUNTER — Other Ambulatory Visit: Payer: Self-pay | Admitting: Cardiology

## 2013-08-22 ENCOUNTER — Ambulatory Visit (HOSPITAL_COMMUNITY): Payer: 59 | Attending: Cardiovascular Disease | Admitting: Radiology

## 2013-08-22 VITALS — BP 110/63 | Ht 72.0 in | Wt 160.0 lb

## 2013-08-22 DIAGNOSIS — R0602 Shortness of breath: Secondary | ICD-10-CM

## 2013-08-22 DIAGNOSIS — R079 Chest pain, unspecified: Secondary | ICD-10-CM

## 2013-08-22 DIAGNOSIS — I2510 Atherosclerotic heart disease of native coronary artery without angina pectoris: Secondary | ICD-10-CM | POA: Insufficient documentation

## 2013-08-22 MED ORDER — TECHNETIUM TC 99M SESTAMIBI GENERIC - CARDIOLITE
33.0000 | Freq: Once | INTRAVENOUS | Status: AC | PRN
  Administered 2013-08-22: 33 via INTRAVENOUS

## 2013-08-22 MED ORDER — TECHNETIUM TC 99M SESTAMIBI GENERIC - CARDIOLITE
10.8000 | Freq: Once | INTRAVENOUS | Status: AC | PRN
  Administered 2013-08-22: 11 via INTRAVENOUS

## 2013-08-22 NOTE — Progress Notes (Signed)
[facility] [place], [place] [address] [phone]    Cardiology Nuclear Med Study  Shawn Lam is a 63 y.o. male     MRN : [id number]     DOB: 1950/08/30  Procedure Date: 08/22/2013  Nuclear Med Background Indication for Stress Test:  Evaluation for Ischemia and Stent Patency History:  History CAD, Stent, 2012 MPI EF 48%, Normal Cardiac Risk Factors: Family History - CAD and Lipids  Symptoms:  Chest Pain, Dizziness, DOE and Palpitations   Nuclear Pre-Procedure Caffeine/Decaff Intake:  None> 12 hrs NPO After: 6:00pm   Lungs:  clear O2 Sat: 97% on room air. IV 0.9% NS with Angio Cath:  22g  IV Site: R Hand x 1, tolerated well IV Started by:  Irven Baltimore, RN  Chest Size (in):  42 Cup Size: n/a  Height: 6' (1.829 m)  Weight:  160 lb (72.576 kg)  BMI:  Body mass index is 21.7 kg/(m^2). Tech Comments:  Patient took all morning medications. Irven Baltimore, RN.    Nuclear Med Study 1 or 2 day study: 1 day  Stress Test Type:  Stress  Reading MD: N/A  Order Authorizing Provider:  Fransico Him, MD  Resting Radionuclide: Technetium 8m Sestamibi  Resting Radionuclide Dose: 11.0 mCi   Stress Radionuclide:  Technetium 69m Sestamibi  Stress Radionuclide Dose: 33.0 mCi           Stress Protocol Rest HR: 60 Stress HR: 146  Rest BP: 110/63 Stress BP: 173/53  Exercise Time (min): 14:14 METS: 16.8   Predicted Max HR: 157 bpm % Max HR: 92.99 bpm Rate Pressure Product: 25258   Dose of Adenosine (mg):  n/a Dose of Lexiscan: n/a mg  Dose of Atropine (mg): n/a Dose of Dobutamine: n/a mcg/kg/min (at max HR)  Stress Test Technologist: Crissie Figures, RN  Nuclear Technologist:  Evalina Field, RT-N     Rest Procedure:  Myocardial perfusion imaging was performed at rest 45 minutes following the intravenous administration of Technetium 87m Sestamibi. Rest ECG: NSR-LVH  Stress Procedure:  The patient exercised on the treadmill utilizing the Bruce  Protocol for 14:14 minutes. The patient stopped due to complaints of dizziness and weakness and denied any chest pain.  Technetium 17m Sestamibi was injected at peak exercise and myocardial perfusion imaging was performed after a brief delay. Stress ECG: No significant change from baseline ECG  QPS Raw Data Images:  Mild diaphragmatic attenuation; normal left ventricular size. Stress Images:  There is mild decreased uptake seen along the inferior wall distribution worse at rest than stress echo secondary to diaphragmatic attenuation. Rest Images:  As above, otherwise homogeneous radiotracer uptake. Subtraction (SDS):  No evidence of ischemia. Transient Ischemic Dilatation (Normal <1.22):  1.00 Lung/Heart Ratio (Normal <0.45):  0.30  Quantitative Gated Spect Images QGS EDV:  n/a ml QGS ESV:  n/a ml  Impression Exercise Capacity:  Excellent exercise capacity. BP Response:  Normal blood pressure response. Clinical Symptoms:  Mild dizziness but no chest pain ECG Impression:  No significant ST segment change suggestive of ischemia. Comparison with Prior Nuclear Study: No images to compare  Overall Impression:  Low risk stress nuclear study with no significant ischemia identified.  LV Ejection Fraction: Study not gated.    Candee Furbish, MD

## 2013-08-25 ENCOUNTER — Other Ambulatory Visit: Payer: Self-pay | Admitting: Family Medicine

## 2013-08-25 NOTE — Telephone Encounter (Signed)
Last office visit 01/31/2014. Ok to refill?

## 2013-08-25 NOTE — Telephone Encounter (Signed)
Looks like this was just ordered 2 weeks ago, not do yet?? Am I reading this correctly? It looks strange.

## 2013-08-26 ENCOUNTER — Other Ambulatory Visit: Payer: Self-pay | Admitting: Family Medicine

## 2013-08-26 NOTE — Telephone Encounter (Signed)
Last refill on alprazolam (per [facility]) was 06/27/2013 for #60.

## 2013-08-27 NOTE — Telephone Encounter (Signed)
Called to [facility].

## 2013-08-29 ENCOUNTER — Other Ambulatory Visit: Payer: Self-pay | Admitting: General Surgery

## 2013-08-29 ENCOUNTER — Encounter: Payer: Self-pay | Admitting: General Surgery

## 2013-08-29 DIAGNOSIS — R079 Chest pain, unspecified: Secondary | ICD-10-CM

## 2013-09-03 ENCOUNTER — Ambulatory Visit (INDEPENDENT_AMBULATORY_CARE_PROVIDER_SITE_OTHER): Payer: 59 | Admitting: Family Medicine

## 2013-09-03 ENCOUNTER — Encounter: Payer: Self-pay | Admitting: Family Medicine

## 2013-09-03 ENCOUNTER — Other Ambulatory Visit: Payer: Self-pay

## 2013-09-03 ENCOUNTER — Other Ambulatory Visit (INDEPENDENT_AMBULATORY_CARE_PROVIDER_SITE_OTHER): Payer: 59

## 2013-09-03 VITALS — BP 100/58 | HR 84 | Temp 98.7°F | Ht 72.0 in | Wt 157.5 lb

## 2013-09-03 DIAGNOSIS — J208 Acute bronchitis due to other specified organisms: Principal | ICD-10-CM

## 2013-09-03 DIAGNOSIS — R079 Chest pain, unspecified: Secondary | ICD-10-CM

## 2013-09-03 DIAGNOSIS — I2510 Atherosclerotic heart disease of native coronary artery without angina pectoris: Secondary | ICD-10-CM

## 2013-09-03 DIAGNOSIS — J209 Acute bronchitis, unspecified: Secondary | ICD-10-CM

## 2013-09-03 DIAGNOSIS — A499 Bacterial infection, unspecified: Secondary | ICD-10-CM

## 2013-09-03 DIAGNOSIS — B9689 Other specified bacterial agents as the cause of diseases classified elsewhere: Secondary | ICD-10-CM

## 2013-09-03 LAB — CBC WITH DIFFERENTIAL/PLATELET
BASOS PCT: 0.2 % (ref 0.0–3.0)
Basophils Absolute: 0 10*3/uL (ref 0.0–0.1)
EOS ABS: 0.1 10*3/uL (ref 0.0–0.7)
Eosinophils Relative: 0.4 % (ref 0.0–5.0)
HCT: 39.5 % (ref 39.0–52.0)
Hemoglobin: 13.1 g/dL (ref 13.0–17.0)
LYMPHS PCT: 8 % — AB (ref 12.0–46.0)
Lymphs Abs: 1 10*3/uL (ref 0.7–4.0)
MCHC: 33.3 g/dL (ref 30.0–36.0)
MCV: 101.3 fl — ABNORMAL HIGH (ref 78.0–100.0)
Monocytes Absolute: 1.1 10*3/uL — ABNORMAL HIGH (ref 0.1–1.0)
Monocytes Relative: 8.2 % (ref 3.0–12.0)
Neutro Abs: 10.7 10*3/uL — ABNORMAL HIGH (ref 1.4–7.7)
Neutrophils Relative %: 83.2 % — ABNORMAL HIGH (ref 43.0–77.0)
Platelets: 187 10*3/uL (ref 150.0–400.0)
RBC: 3.9 Mil/uL — AB (ref 4.22–5.81)
RDW: 14 % (ref 11.5–15.5)
WBC: 12.8 10*3/uL — AB (ref 4.0–10.5)

## 2013-09-03 LAB — BASIC METABOLIC PANEL
BUN: 23 mg/dL (ref 6–23)
CO2: 25 mEq/L (ref 19–32)
Calcium: 8.8 mg/dL (ref 8.4–10.5)
Chloride: 103 mEq/L (ref 96–112)
Creatinine, Ser: 1.3 mg/dL (ref 0.4–1.5)
GFR: 58.7 mL/min — ABNORMAL LOW (ref >60.00)
Glucose, Bld: 95 mg/dL (ref 70–99)
Potassium: 4.3 mEq/L (ref 3.5–5.1)
Sodium: 138 mEq/L (ref 135–145)

## 2013-09-03 LAB — PROTIME-INR
INR: 9.9 ratio (ref 0.8–1.0)
PROTHROMBIN TIME: 102.8 s — AB (ref 9.6–13.1)

## 2013-09-03 MED ORDER — AZITHROMYCIN 250 MG PO TABS: ORAL_TABLET | ORAL | Status: DC

## 2013-09-03 MED ORDER — HYDROCODONE-HOMATROPINE 5-1.5 MG/5ML PO SYRP: 5.0000 mL | ORAL_SOLUTION | Freq: Every evening | ORAL | Status: DC | PRN

## 2013-09-03 NOTE — Progress Notes (Signed)
   Subjective:    Patient ID: Shawn Lam, male    DOB: 10-26-1950, 63 y.o.   MRN: [id number]  Cough This is a new problem. The current episode started 1 to 4 weeks ago. The problem has been gradually worsening. The cough is productive of sputum. Associated symptoms include myalgias and nasal congestion. Pertinent negatives include no chills, ear pain, fever, postnasal drip, sore throat, shortness of breath, sweats or wheezing. Associated symptoms comments: No sinus pain. The symptoms are aggravated by lying down (cough keeping him up at night). Risk factors: nonsmoker. Treatments tried: mucinex. The treatment provided mild relief. There is no history of asthma, COPD, environmental allergies or pneumonia.      Review of Systems  Constitutional: Negative for fever and chills.  HENT: Negative for ear pain, postnasal drip and sore throat.   Respiratory: Positive for cough. Negative for shortness of breath and wheezing.   Musculoskeletal: Positive for myalgias.  Allergic/Immunologic: Negative for environmental allergies.       Objective:   Physical Exam  Constitutional: Vital signs are normal. He appears well-developed and well-nourished.  Non-toxic appearance. He does not appear ill. No distress.  HENT:  Head: Normocephalic and atraumatic.  Right Ear: Hearing, tympanic membrane, external ear and ear canal normal. No tenderness. No foreign bodies. Tympanic membrane is not retracted and not bulging.  Left Ear: Hearing, tympanic membrane, external ear and ear canal normal. No tenderness. No foreign bodies. Tympanic membrane is not retracted and not bulging.  Nose: Nose normal. No mucosal edema or rhinorrhea. Right sinus exhibits no maxillary sinus tenderness and no frontal sinus tenderness. Left sinus exhibits no maxillary sinus tenderness and no frontal sinus tenderness.  Mouth/Throat: Uvula is midline, oropharynx is clear and moist and mucous membranes are normal. Normal dentition. No  dental caries. No oropharyngeal exudate or tonsillar abscesses.  Eyes: Conjunctivae, EOM and lids are normal. Pupils are equal, round, and reactive to light. Lids are everted and swept, no foreign bodies found.  Neck: Trachea normal, normal range of motion and phonation normal. Neck supple. Carotid bruit is not present. No mass and no thyromegaly present.  Cardiovascular: Normal rate, regular rhythm, S1 normal, S2 normal, normal heart sounds, intact distal pulses and normal pulses.  Exam reveals no gallop.   No murmur heard. Pulmonary/Chest: Effort normal and breath sounds normal. No respiratory distress. He has no wheezes. He has no rhonchi. He has no rales.  Abdominal: Soft. Normal appearance and bowel sounds are normal. There is no hepatosplenomegaly. There is no tenderness. There is no rebound, no guarding and no CVA tenderness. No hernia.  Neurological: He is alert. He has normal reflexes.  Skin: Skin is warm, dry and intact. No rash noted.  Psychiatric: He has a normal mood and affect. His speech is normal and behavior is normal. Judgment normal.          Assessment & Plan:

## 2013-09-03 NOTE — Assessment & Plan Note (Signed)
>   2 weeks  Of symptoms and upcoming cardiac cath. Will treat with antibiotics. Cough suppressant at night.

## 2013-09-03 NOTE — Patient Instructions (Addendum)
Complete antibiotics. Push fluids rest, mucinex DM during the day, cough suppressant at night.  Call if not improving as expected.

## 2013-09-03 NOTE — Progress Notes (Signed)
Pre visit review using our clinic review tool, if applicable. No additional management support is needed unless otherwise documented below in the visit note. 

## 2013-09-04 ENCOUNTER — Other Ambulatory Visit (INDEPENDENT_AMBULATORY_CARE_PROVIDER_SITE_OTHER): Payer: 59

## 2013-09-04 DIAGNOSIS — I2510 Atherosclerotic heart disease of native coronary artery without angina pectoris: Secondary | ICD-10-CM

## 2013-09-04 LAB — HEPATIC FUNCTION PANEL
ALK PHOS: 43 U/L (ref 39–117)
ALT: 22 U/L (ref 0–53)
AST: 32 U/L (ref 0–37)
Albumin: 3 g/dL — ABNORMAL LOW (ref 3.5–5.2)
BILIRUBIN DIRECT: 0.1 mg/dL (ref 0.0–0.3)
BILIRUBIN TOTAL: 0.6 mg/dL (ref 0.2–1.2)
TOTAL PROTEIN: 5.5 g/dL — AB (ref 6.0–8.3)

## 2013-09-04 LAB — PROTIME-INR
INR: 0.9 ratio (ref 0.8–1.0)
Prothrombin Time: 10 s (ref 9.6–13.1)

## 2013-09-05 ENCOUNTER — Telehealth: Payer: Self-pay | Admitting: Cardiology

## 2013-09-05 ENCOUNTER — Encounter (HOSPITAL_COMMUNITY): Payer: Self-pay | Admitting: Pharmacy Technician

## 2013-09-05 NOTE — Telephone Encounter (Signed)
New message     Is procedure still on for tomorrow?  Patient's labs were abn and do not know if procedure is still scheduled.  Please call

## 2013-09-05 NOTE — Telephone Encounter (Signed)
Left message for pt, his repeat labs were normal. His cath is still on for tomorrow

## 2013-09-06 ENCOUNTER — Ambulatory Visit (HOSPITAL_COMMUNITY)
Admission: RE | Admit: 2013-09-06 | Discharge: 2013-09-06 | Disposition: A | Discharge status: Home / Self Care | Payer: 59 | Source: Ambulatory Visit | Attending: Interventional Cardiology | Admitting: Interventional Cardiology

## 2013-09-06 ENCOUNTER — Encounter (HOSPITAL_COMMUNITY): Payer: Self-pay | Admitting: Interventional Cardiology

## 2013-09-06 ENCOUNTER — Ambulatory Visit: Payer: 59 | Admitting: Family Medicine

## 2013-09-06 ENCOUNTER — Encounter (HOSPITAL_COMMUNITY)
Admission: RE | Disposition: A | Discharge status: Home / Self Care | Payer: Self-pay | Source: Ambulatory Visit | Attending: Interventional Cardiology

## 2013-09-06 DIAGNOSIS — Z7982 Long term (current) use of aspirin: Secondary | ICD-10-CM | POA: Insufficient documentation

## 2013-09-06 DIAGNOSIS — R079 Chest pain, unspecified: Secondary | ICD-10-CM | POA: Insufficient documentation

## 2013-09-06 DIAGNOSIS — E785 Hyperlipidemia, unspecified: Secondary | ICD-10-CM | POA: Insufficient documentation

## 2013-09-06 DIAGNOSIS — F411 Generalized anxiety disorder: Secondary | ICD-10-CM | POA: Insufficient documentation

## 2013-09-06 DIAGNOSIS — Z9861 Coronary angioplasty status: Secondary | ICD-10-CM | POA: Insufficient documentation

## 2013-09-06 DIAGNOSIS — I2510 Atherosclerotic heart disease of native coronary artery without angina pectoris: Secondary | ICD-10-CM

## 2013-09-06 DIAGNOSIS — I10 Essential (primary) hypertension: Secondary | ICD-10-CM | POA: Insufficient documentation

## 2013-09-06 DIAGNOSIS — T82898A Other specified complication of vascular prosthetic devices, implants and grafts, initial encounter: Secondary | ICD-10-CM | POA: Insufficient documentation

## 2013-09-06 DIAGNOSIS — G4700 Insomnia, unspecified: Secondary | ICD-10-CM | POA: Insufficient documentation

## 2013-09-06 DIAGNOSIS — K219 Gastro-esophageal reflux disease without esophagitis: Secondary | ICD-10-CM | POA: Insufficient documentation

## 2013-09-06 DIAGNOSIS — Y831 Surgical operation with implant of artificial internal device as the cause of abnormal reaction of the patient, or of later complication, without mention of misadventure at the time of the procedure: Secondary | ICD-10-CM | POA: Insufficient documentation

## 2013-09-06 HISTORY — PX: LEFT HEART CATHETERIZATION WITH CORONARY ANGIOGRAM: SHX5451

## 2013-09-06 HISTORY — PX: CARDIAC CATHETERIZATION: SHX172

## 2013-09-06 SURGERY — LEFT HEART CATHETERIZATION WITH CORONARY ANGIOGRAM
Anesthesia: LOCAL

## 2013-09-06 MED ORDER — ASPIRIN 81 MG PO CHEW: 81.0000 mg | CHEWABLE_TABLET | ORAL | Status: DC

## 2013-09-06 MED ORDER — SODIUM CHLORIDE 0.9 % IV SOLN: INTRAVENOUS | Status: DC

## 2013-09-06 MED ORDER — HEPARIN (PORCINE) IN NACL 2-0.9 UNIT/ML-% IJ SOLN
INTRAMUSCULAR | Status: AC
  Filled 2013-09-06: qty 1000

## 2013-09-06 MED ORDER — SODIUM CHLORIDE 0.9 % IJ SOLN: 3.0000 mL | INTRAMUSCULAR | Status: DC | PRN

## 2013-09-06 MED ORDER — MIDAZOLAM HCL 2 MG/2ML IJ SOLN
INTRAMUSCULAR | Status: AC
  Filled 2013-09-06: qty 2

## 2013-09-06 MED ORDER — SODIUM CHLORIDE 0.9 % IV SOLN
INTRAVENOUS | Status: DC
  Administered 2013-09-06: 06:00:00 via INTRAVENOUS

## 2013-09-06 MED ORDER — LIDOCAINE HCL (PF) 1 % IJ SOLN
INTRAMUSCULAR | Status: AC
  Filled 2013-09-06: qty 30

## 2013-09-06 MED ORDER — SODIUM CHLORIDE 0.9 % IV SOLN: 250.0000 mL | INTRAVENOUS | Status: DC | PRN

## 2013-09-06 MED ORDER — SODIUM CHLORIDE 0.9 % IJ SOLN: 3.0000 mL | Freq: Two times a day (BID) | INTRAMUSCULAR | Status: DC

## 2013-09-06 MED ORDER — NITROGLYCERIN 0.2 MG/ML ON CALL CATH LAB
INTRAVENOUS | Status: AC
  Filled 2013-09-06: qty 1

## 2013-09-06 MED ORDER — FENTANYL CITRATE 0.05 MG/ML IJ SOLN
INTRAMUSCULAR | Status: AC
  Filled 2013-09-06: qty 2

## 2013-09-06 NOTE — CV Procedure (Signed)
    Left Heart Catheterization with Coronary Angiography  Report  Shawn Lam  63 y.o.  male Aug 12, 1950  Procedure Date: 09/06/2013 Referring Physician: Fransico Him, M.D. Primary Cardiologist:Traci Turner M.D.  INDICATIONS: Exertional chest pain. Recent negative myocardial perfusion study (low risk). Prior history of LAD and right coronary stenting 2007.  PROCEDURE: 1. Left heart catheterization; 2. Coronary angiography; 3. Left ventriculography  CONSENT:  The risks, benefits, and details of the procedure were explained in detail to the patient. Risks including death, stroke, heart attack, kidney injury, allergy, limb ischemia, bleeding and radiation injury were discussed.  The patient verbalized understanding and wanted to proceed.  Informed written consent was obtained.  PROCEDURE TECHNIQUE:  After Xylocaine anesthesia a 5 French Slender sheath was placed in the right radial artery with an angiocath and the modified Seldinger technique.  Coronary angiography was done using a 5 F JL 3.5 and JR 4 diagnostic catheter.  Left ventriculography was done using the JR 4 catheter and hand injection.   Hemostasis achieved with wrist band.   CONTRAST:  Total of 85 cc.  COMPLICATIONS:  None   HEMODYNAMICS:  Aortic pressure 89/52; LV pressure 92/6 mmHg; LVEDP 7 mm mercury  ANGIOGRAPHIC DATA:   The left main coronary artery is normal.  The left anterior descending artery is widely patent. The proximal LAD stent contains distal margin 30% narrowing due to ISR.[name]  The left circumflex artery is widely patent.  The right coronary artery is widely patent. The mid right coronary stent is without evidence of ISR.    LEFT VENTRICULOGRAM:  Left ventricular angiogram was done in the 30 RAO projection and revealed 55% without regional wall motion abnormality.   IMPRESSIONS:  1. Widely patent coronary arteries including the proximal LAD stent and mid RCA stent. There is 25-30% in-stent  restenosis within the distal margin of the LAD stent. 2. Normal LV function   RECOMMENDATION:  Medical therapy.

## 2013-09-06 NOTE — Discharge Instructions (Signed)

## 2013-09-06 NOTE — H&P (Signed)
Shawn Lam is a 63 y.o. male  Admit Date: 09/06/2013 Referring Physician: Fransico Him, M.D. Primary Cardiologist: Fransico Him, M.D. Chief complaint / reason for admission: Exertional chest pain  HPI: 63 year old with a recent development of exertional chest pressure and weakness. A stress nuclear study performed within the past 3 weeks was low risk. Because of the predictable recurring nature of the chest discomfort Dr. Radford Pax has referred the patient for coronary angiography. History of LAD and RCA PCI in 2007.    PMH:    Past Medical History  Diagnosis Date  . Anxiety   . GERD (gastroesophageal reflux disease)   . Urticaria   . Rosacea   . Insomnia   . Hematuria   . CAD (coronary artery disease) 06/2005    PCI of LAD and RCA  . Orthostatic hypotension     negative tilt table  . Hyperlipidemia   . Asymptomatic LV dysfunction     mild with EF 48%    PSH:    Past Surgical History  Procedure Laterality Date  . Cardiac catheterization    . Coronary angioplasty     ALLERGIES:   Codeine Prior to Admit Meds:   Prescriptions prior to admission  Medication Sig Dispense Refill  . aspirin EC 81 MG tablet Take 1 tablet (81 mg total) by mouth daily.  90 tablet  3  . atorvastatin (LIPITOR) 80 MG tablet Take 80 mg by mouth daily.      [name] azithromycin (ZITHROMAX) 250 MG tablet Take 250-500 mg by mouth See admin instructions. Take 2 tablets for 1 dose. Then take 1 tablet daily thereafter for 5 days total      . clopidogrel (PLAVIX) 75 MG tablet Take 75 mg by mouth daily with breakfast.      . ferrous sulfate 325 (65 FE) MG tablet Take 325 mg by mouth 3 (three) times daily with meals.      . fludrocortisone (FLORINEF) 0.1 MG tablet Take 1 tablet (0.1 mg total) by mouth daily.  90 tablet  3  . FLUOCINOLONE ACETONIDE BODY 0.01 % OIL Apply 1 application topically daily as needed (for dry skin).       [name] HYDROcodone-homatropine (HYCODAN) 5-1.5 MG/5ML syrup Take 5 mLs by mouth at  bedtime as needed for cough.  120 mL  0  . hydrOXYzine (ATARAX/VISTARIL) 25 MG tablet Take 50 mg by mouth at bedtime.       . Multiple Vitamins-Minerals (CENTRUM ADULTS PO) Take 1 tablet by mouth daily.      [name] omega-3 acid ethyl esters (LOVAZA) 1 G capsule Take 1 g by mouth daily.       . sertraline (ZOLOFT) 100 MG tablet Take 200 mg by mouth at bedtime.      . vitamin B-12 (CYANOCOBALAMIN) 1000 MCG tablet Take 1,000 mcg by mouth daily.       Family HX:    Family History  Problem Relation Age of Onset  . Coronary artery disease Mother   . Heart attack Mother   . Heart disease Mother   . Hypertension Mother   . Cancer Father     lung and colon  . Diabetes Brother    Social HX:    History   Social History  . Marital Status: Married    Spouse Name: N/A    Number of Children: N/A  . Years of Education: N/A   Occupational History  . retired Print production planner    Social History Main  Topics  . Smoking status: Never Smoker   . Smokeless tobacco: Never Used  . Alcohol Use: 2.4 oz/week    4 Cans of beer per week  . Drug Use: No  . Sexual Activity: Not on file   Other Topics Concern  . Not on file   Social History Narrative   Regular exercise--yes--jog 5 miles 6 days a week     ROS : exertional fatigue and dyspnea. No history of stroke. No history of infection including HIV, hepatitis, or other viral illness  Physical Exam: Blood pressure 112/60, pulse 77, temperature 98.6 F (37 C), temperature source Oral, resp. rate 18, height 6' (1.829 m), weight 160 lb (72.576 kg), SpO2 98.00%.    HEENT exam is unremarkable Cardiac exam is normal Lungs are clear to auscultation and percussion Neck exam reveals no JVD or carotid bruits Abdomen is soft without bruits or tenderness Neurological exam is unremarkable Extremities reveal no edema. Pulses are 2+ and symmetric.  Labs: Lab Results  Component Value Date   WBC 12.8* 09/03/2013   HGB 13.1 09/03/2013   HCT 39.5 09/03/2013   MCV  101.3* 09/03/2013   PLT 187.0 09/03/2013     Recent Labs Lab 09/03/13 1307 09/04/13 0736  NA 138  --   K 4.3  --   CL 103  --   CO2 25  --   BUN 23  --   CREATININE 1.3  --   CALCIUM 8.8  --   PROT  --  5.5*  BILITOT  --  0.6  ALKPHOS  --  43  ALT  --  22  AST  --  32  GLUCOSE 95  --    No results found for this basename: CKTOTAL,  CKMB,  CKMBINDEX,  TROPONINI    Radiology:  No results found.  EKG:  Performed in April is normal.  ASSESSMENT: 1. Exertional chest pain compatible with angina. Recent low-risk myocardial perfusion study.  2. Hypertension 3. Hyperlipidemia  Plan: Procedure and risks were discussed in detail with the patient. The risk of stroke, death, myocardial infarction, bleeding, limb ischemia, kidney injury, among others were discussed in detail with the patient and accepted.  Belva Crome III 09/06/2013 7:37 AM

## 2013-09-06 NOTE — Interval H&P Note (Signed)
Cath Lab Visit (complete for each Cath Lab visit)  Clinical Evaluation Leading to the Procedure:   ACS: no  Non-ACS:    Anginal Classification: CCS II  Anti-ischemic medical therapy: Minimal Therapy (1 class of medications)  Non-Invasive Test Results: Low-risk stress test findings: cardiac mortality <1%/year  Prior CABG: No previous CABG      History and Physical Interval Note:  09/06/2013 7:43 AM  Shawn Lam  has presented today for surgery, with the diagnosis of cp  The various methods of treatment have been discussed with the patient and family. After consideration of risks, benefits and other options for treatment, the patient has consented to  Procedure(s): LEFT HEART CATHETERIZATION WITH CORONARY ANGIOGRAM (N/A) as a surgical intervention .  The patient's history has been reviewed, patient examined, no change in status, stable for surgery.  I have reviewed the patient's chart and labs.  Questions were answered to the patient's satisfaction.     Belva Crome III

## 2013-09-12 ENCOUNTER — Ambulatory Visit (INDEPENDENT_AMBULATORY_CARE_PROVIDER_SITE_OTHER): Payer: 59 | Admitting: Cardiology

## 2013-09-12 ENCOUNTER — Encounter: Payer: Self-pay | Admitting: Cardiology

## 2013-09-12 VITALS — BP 110/72 | HR 72 | Ht 72.0 in | Wt 161.0 lb

## 2013-09-12 DIAGNOSIS — R531 Weakness: Secondary | ICD-10-CM

## 2013-09-12 DIAGNOSIS — I951 Orthostatic hypotension: Secondary | ICD-10-CM

## 2013-09-12 DIAGNOSIS — R5383 Other fatigue: Secondary | ICD-10-CM

## 2013-09-12 DIAGNOSIS — E785 Hyperlipidemia, unspecified: Secondary | ICD-10-CM

## 2013-09-12 DIAGNOSIS — R079 Chest pain, unspecified: Secondary | ICD-10-CM

## 2013-09-12 DIAGNOSIS — R5381 Other malaise: Secondary | ICD-10-CM

## 2013-09-12 DIAGNOSIS — I2510 Atherosclerotic heart disease of native coronary artery without angina pectoris: Secondary | ICD-10-CM

## 2013-09-12 MED ORDER — NITROGLYCERIN 0.4 MG SL SUBL: 0.4000 mg | SUBLINGUAL_TABLET | SUBLINGUAL | Status: DC | PRN

## 2013-09-12 NOTE — Patient Instructions (Addendum)
Your physician has requested that you have an echocardiogram. Echocardiography is a painless test that uses sound waves to create images of your heart. It provides your doctor with information about the size and shape of your heart and how well your heart's chambers and valves are working. This procedure takes approximately one hour. There are no restrictions for this procedure.  Your physician has requested that you regularly monitor and record your blood pressure readings at home when you have an episode of weakness.   Your physician recommends that you follow up as scheduled in April of 2016. You will receive a letter 2 months before you are due for appointment.

## 2013-09-12 NOTE — Progress Notes (Signed)
[place] [place], [place]  [address] Phone: [phone] Fax:  [phone]  Date:  09/12/2013   ID:  ELIJIAH ALTOMARI, DOB [birth year], MRN [id number]  PCP:  [name], MD  Cardiologist:  Fransico Him, MD     History of Present Illness: Shawn Lam is a 63 y.o. male with ahistory of ASCAD, dyslipidemia, mild LV dysfunction with EF 48% who presents today for followup. He is doing well. He denies any SOB, DOE, LE edema, palpitations or syncope. His dizziness is stable on Florinef. He recently underwent cardiac cath for exertional chest pain that continued despite normal myocardial perfusion scan.  Cath showed widely patent coronary arteries including proximal LAD and mid RCA stents with 25-30% instent restenosis of the distal margin of the LAD stent and normal LVF.  He now presents back today for followup.  He started back on Monday with running and started having weakness in his legs again with diaphoresis and chest discomfort.    Wt Readings from Last 3 Encounters:  09/12/13 161 lb (73.029 kg)  09/06/13 160 lb (72.576 kg)  09/06/13 160 lb (72.576 kg)     Past Medical History  Diagnosis Date  . Anxiety   . GERD (gastroesophageal reflux disease)   . Urticaria   . Rosacea   . Insomnia   . Hematuria   . CAD (coronary artery disease) 06/2005    PCI of LAD and RCA  . Orthostatic hypotension     negative tilt table  . Hyperlipidemia   . Asymptomatic LV dysfunction     mild with EF 48%    Current Outpatient Prescriptions  Medication Sig Dispense Refill  . aspirin EC 81 MG tablet Take 1 tablet (81 mg total) by mouth daily.  90 tablet  3  . atorvastatin (LIPITOR) 80 MG tablet Take 80 mg by mouth daily.      . clopidogrel (PLAVIX) 75 MG tablet Take 75 mg by mouth daily with breakfast.      . ferrous sulfate 325 (65 FE) MG tablet Take 325 mg by mouth 3 (three) times daily with meals.      . fludrocortisone (FLORINEF) 0.1 MG tablet Take 1 tablet (0.1 mg total) by  mouth daily.  90 tablet  3  . FLUOCINOLONE ACETONIDE BODY 0.01 % OIL Apply 1 application topically daily as needed (for dry skin).       [name] HYDROcodone-homatropine (HYCODAN) 5-1.5 MG/5ML syrup Take 5 mLs by mouth at bedtime as needed for cough.  120 mL  0  . hydrOXYzine (ATARAX/VISTARIL) 25 MG tablet Take 50 mg by mouth at bedtime.       . Multiple Vitamins-Minerals (CENTRUM ADULTS PO) Take 1 tablet by mouth daily.      [name] omega-3 acid ethyl esters (LOVAZA) 1 G capsule Take 1 g by mouth daily.       . sertraline (ZOLOFT) 100 MG tablet Take 200 mg by mouth at bedtime.      . vitamin B-12 (CYANOCOBALAMIN) 1000 MCG tablet Take 1,000 mcg by mouth daily.       No current facility-administered medications for this visit.    Allergies:    Allergies  Allergen Reactions  . Codeine Nausea Only    Social History:  The patient  reports that he has never smoked. He has never used smokeless tobacco. He reports that he drinks about 2.4 ounces of alcohol per week. He reports that he does not use illicit drugs.   Family  History:  The patient's family history includes Cancer in his father; Coronary artery disease in his mother; Diabetes in his brother; Heart attack in his mother; Heart disease in his mother; Hypertension in his mother.   ROS:  Please see the history of present illness.      All other systems reviewed and negative.   PHYSICAL EXAM: VS:  BP 110/72  Pulse 72  Ht 6' (1.829 m)  Wt 161 lb (73.029 kg)  BMI 21.83 kg/m2 Well nourished, well developed, in no acute distress HEENT: normal Neck: no JVD Cardiac:  normal S1, S2; RRR; no murmur Lungs:  clear to auscultation bilaterally, no wheezing, rhonchi or rales Abd: soft, nontender, no hepatomegaly Ext: no edema Skin: warm and dry Neuro:  CNs 2-12 intact, no focal abnormalities noted     ASSESSMENT AND PLAN:  1. ASCAD with wome atypical CP during running and weaknes of unclear etiology.  His recent cath showed widely patent stents.   -  continue ASA/Plavix  2. Dyslipidemia  - continue atorvastatin  3. Orthostatic hypotension  - continue Florinef  4.  Weakness and diaphoresis during running ? Hypoglycemia or orthostatic hypotension during exercise. - I have asked him to check his BP when he has an episode.   - He LVF was normal at cath but I will check a 2D echo to assess for valvular abnormalities.   Signed, Fransico Him, MD 09/12/2013 1:47 PM

## 2013-09-17 ENCOUNTER — Telehealth: Payer: Self-pay | Admitting: Cardiology

## 2013-09-17 MED ORDER — MIDODRINE HCL 2.5 MG PO TABS: 2.5000 mg | ORAL_TABLET | Freq: Two times a day (BID) | ORAL | Status: DC

## 2013-09-17 NOTE — Telephone Encounter (Signed)
Called patient back. He went jogging today and felt weak and dizzy and checked BP and it was 95/71. One hour later his BP was 125/82 and felt better. He states that he does hydrate well before exercise. Advised will forward note to Dr. Radford Pax with BP readings and call him back with MD advice.

## 2013-09-17 NOTE — Telephone Encounter (Signed)
Please add proamatine 2.5mg  BID and have him wear TED hose compression stockings when he runs.  Please have him check his BP daily for a week and call with the results.

## 2013-09-17 NOTE — Telephone Encounter (Signed)
New message     Patient went jogging  Today  blood pressure 95/71 .Was told to call the office when blood pressure drop.

## 2013-09-17 NOTE — Telephone Encounter (Signed)
Called patient again and he is aware to start Proamatine and compression knee highs when he runs. Will call back with BP readings.

## 2013-09-18 ENCOUNTER — Telehealth: Payer: Self-pay | Admitting: Cardiology

## 2013-09-18 NOTE — Telephone Encounter (Signed)
AGree with parameters stated above

## 2013-09-18 NOTE — Telephone Encounter (Signed)
Returned call to patient he stated he wanted to know B/P high and low targets.Advised B/P should not be over 140/90 and not under 100/50.Stated he was prescribed medication yesterday to help keep B/P from being too low.Advised will send message to Dr.Turner for review.

## 2013-09-18 NOTE — Telephone Encounter (Signed)
Returned call to patient Dr.Turner agreed with B/P parameters.Advised to call back if needed.

## 2013-09-18 NOTE — Telephone Encounter (Signed)
New message    Patient calling wants to discuss what the discuss the target blood pressure what's consider high or low.

## 2013-09-27 ENCOUNTER — Telehealth: Payer: Self-pay | Admitting: Cardiology

## 2013-09-27 NOTE — Telephone Encounter (Signed)
New message    Calling back with blood pressure reading   6/15 97/54, 63  6/16 107/62, 62 6/17 103/56, 59 6/18 102/58, 63 6/19 97/57, 61

## 2013-09-27 NOTE — Telephone Encounter (Signed)
Increase Proamatine to 5mg  BID and check BP twice daily and call with results

## 2013-09-27 NOTE — Telephone Encounter (Signed)
Called to give BP readings.  States he is still having sl dizziness when jogging and when first gets up in the mornings.  Legs still seem weak. Is wearing the TED hose but doesn't feel like they are helping.  States overall he is feeling some better except for dizziness.  States Dr. Radford Pax started him on lowest dose of Proamatine 2.5 mg BID and was wondering if needed to be increased.  Advised Dr. Radford Pax is at hospital today and will forward message to her for advise.

## 2013-09-30 ENCOUNTER — Other Ambulatory Visit: Payer: Self-pay | Admitting: General Surgery

## 2013-09-30 MED ORDER — MIDODRINE HCL 5 MG PO TABS: 5.0000 mg | ORAL_TABLET | Freq: Two times a day (BID) | ORAL | Status: DC

## 2013-09-30 NOTE — Telephone Encounter (Signed)
Pt is aware. And new rx sent in for pt.

## 2013-10-01 ENCOUNTER — Other Ambulatory Visit: Payer: Self-pay | Admitting: Family Medicine

## 2013-10-07 ENCOUNTER — Ambulatory Visit (HOSPITAL_COMMUNITY): Payer: 59 | Attending: Cardiovascular Disease | Admitting: Cardiology

## 2013-10-07 DIAGNOSIS — R5383 Other fatigue: Principal | ICD-10-CM

## 2013-10-07 DIAGNOSIS — R079 Chest pain, unspecified: Secondary | ICD-10-CM | POA: Insufficient documentation

## 2013-10-07 DIAGNOSIS — R5381 Other malaise: Secondary | ICD-10-CM | POA: Insufficient documentation

## 2013-10-07 DIAGNOSIS — I951 Orthostatic hypotension: Secondary | ICD-10-CM

## 2013-10-07 DIAGNOSIS — R531 Weakness: Secondary | ICD-10-CM

## 2013-10-07 DIAGNOSIS — I2510 Atherosclerotic heart disease of native coronary artery without angina pectoris: Secondary | ICD-10-CM | POA: Insufficient documentation

## 2013-10-07 NOTE — Progress Notes (Signed)
Echo performed. 

## 2013-10-10 ENCOUNTER — Other Ambulatory Visit: Payer: Self-pay | Admitting: General Surgery

## 2013-10-10 DIAGNOSIS — I519 Heart disease, unspecified: Secondary | ICD-10-CM

## 2013-10-10 DIAGNOSIS — R931 Abnormal findings on diagnostic imaging of heart and coronary circulation: Secondary | ICD-10-CM

## 2013-10-16 ENCOUNTER — Telehealth: Payer: Self-pay | Admitting: Cardiology

## 2013-10-16 NOTE — Telephone Encounter (Signed)
To Dr Radford Pax to review and advise.

## 2013-10-16 NOTE — Telephone Encounter (Signed)
Pt is aware.  

## 2013-10-16 NOTE — Telephone Encounter (Signed)
yes

## 2013-10-16 NOTE — Telephone Encounter (Signed)
New message     FYI Pt is having an MRI 11-04-13.  Blood pressure is still the same---averaging 100/59.  His medication was increased to 5mg . Since MRI is on the 27th, should he continue taking medications like he is taking them?

## 2013-10-17 ENCOUNTER — Other Ambulatory Visit: Payer: Self-pay | Admitting: Family Medicine

## 2013-10-17 NOTE — Telephone Encounter (Signed)
Last office visit 09/03/2013.  Not on current medication list.   Refill?

## 2013-10-18 NOTE — Telephone Encounter (Signed)
Called to [facility] [address]

## 2013-10-24 ENCOUNTER — Encounter: Payer: Self-pay | Admitting: Cardiology

## 2013-11-04 ENCOUNTER — Ambulatory Visit (HOSPITAL_COMMUNITY)
Admission: RE | Admit: 2013-11-04 | Discharge: 2013-11-04 | Disposition: A | Discharge status: Home / Self Care | Payer: 59 | Source: Ambulatory Visit | Attending: Cardiology | Admitting: Cardiology

## 2013-11-04 DIAGNOSIS — R9389 Abnormal findings on diagnostic imaging of other specified body structures: Secondary | ICD-10-CM

## 2013-11-04 DIAGNOSIS — R931 Abnormal findings on diagnostic imaging of heart and coronary circulation: Secondary | ICD-10-CM

## 2013-11-04 DIAGNOSIS — I519 Heart disease, unspecified: Secondary | ICD-10-CM

## 2013-11-04 LAB — CREATININE, SERUM
Creatinine, Ser: 1.09 mg/dL (ref 0.50–1.35)
GFR, EST AFRICAN AMERICAN: 82 mL/min — AB (ref >90)
GFR, EST NON AFRICAN AMERICAN: 70 mL/min — AB (ref >90)

## 2013-11-04 MED ORDER — GADOBENATE DIMEGLUMINE 529 MG/ML IV SOLN
25.0000 mL | Freq: Once | INTRAVENOUS | Status: AC
  Administered 2013-11-04: 24 mL via INTRAVENOUS

## 2013-11-07 ENCOUNTER — Telehealth: Payer: Self-pay | Admitting: Cardiology

## 2013-11-07 NOTE — Telephone Encounter (Signed)
Patient is returning your call. Please call back.  °

## 2013-11-11 ENCOUNTER — Encounter: Payer: Self-pay | Admitting: Family Medicine

## 2013-11-11 DIAGNOSIS — N179 Acute kidney failure, unspecified: Secondary | ICD-10-CM

## 2013-11-11 DIAGNOSIS — R319 Hematuria, unspecified: Secondary | ICD-10-CM

## 2013-11-12 NOTE — Addendum Note (Signed)
Addended byEliezer Lofts E on: 11/12/2013 12:12 PM   Modules accepted: Orders

## 2013-11-19 ENCOUNTER — Other Ambulatory Visit: Payer: Self-pay | Admitting: Family Medicine

## 2013-12-10 ENCOUNTER — Other Ambulatory Visit: Payer: Self-pay | Admitting: Family Medicine

## 2013-12-10 NOTE — Telephone Encounter (Signed)
Last office visit 09/03/2013.  Last refilled 10/18/2013 for #60 with no refills.  Ok to refill?

## 2013-12-10 NOTE — Telephone Encounter (Signed)
Alprazolam called to [facility] [address]

## 2013-12-17 ENCOUNTER — Encounter: Payer: Self-pay | Admitting: Internal Medicine

## 2013-12-17 ENCOUNTER — Encounter: Payer: Self-pay | Admitting: Family Medicine

## 2013-12-17 ENCOUNTER — Ambulatory Visit (INDEPENDENT_AMBULATORY_CARE_PROVIDER_SITE_OTHER): Payer: 59 | Admitting: Internal Medicine

## 2013-12-17 VITALS — BP 124/77 | HR 61 | Ht 72.0 in | Wt 160.5 lb

## 2013-12-17 DIAGNOSIS — R079 Chest pain, unspecified: Secondary | ICD-10-CM

## 2013-12-17 DIAGNOSIS — R0683 Snoring: Secondary | ICD-10-CM

## 2013-12-17 NOTE — Patient Instructions (Signed)
-  Elevate head of bed 6 in -Perform isometric exercises  -Increase sodium intake  -Use abdominal binder  -Purchase Alivecor monitor   See Dr.Bedsole for a sleep study   Your physician recommends that you schedule a follow-up appointment in:  3 months with Dr. Caryl Comes

## 2013-12-17 NOTE — Progress Notes (Signed)
ELECTROPHYSIOLOGY CONSULT NOTE  Patient ID: Shawn Lam, MRN: [id number], DOB/AGE: 09-16-1950 63 y.o. Admit date: (Not on file) Date of Consult: 12/17/2013  Primary Physician: [name], MD Primary Cardiologist: TT  Chief Complaint:    HPI Shawn Lam is a 63 y.o. male  Referred for evaluation of dizziness which in the past has been documented to be associated with BP 120>>90   I dont see the note specificly requesting this appt  He has orthostaticintolerance some shower intolerance and surprisingly some with exercise  The exercise stress test from one year ago had no interval data, starting and finidhing blood pressures were however normal  He has had two episodes of syncope; both of which have had similar but accelerated prodrome.  His diet is replete of fluid but deplete of sodium   He has also been diagnosed with Bergers disease, and IgA nephropathy can be associated with nephrotic syndrome  His symptoms improved with the use of florinef, more so than it had with the use of proamatine which he is still taking  He has hx of CAD with stenting remotely   He takes both asa and plavix  In context of above smptoms, he underwent myoview which was abnormal, a cath whichwas normal and with discordant EF, also MRI which showed no scar and normal EF     Past Medical History  Diagnosis Date  . Anxiety   . GERD (gastroesophageal reflux disease)   . Urticaria   . Rosacea   . Insomnia   . Hematuria   . CAD (coronary artery disease) 06/2005    PCI of LAD and RCA  . Orthostatic hypotension     negative tilt table  . Hyperlipidemia   . Asymptomatic LV dysfunction     mild with EF 48%      Surgical History:  Past Surgical History  Procedure Laterality Date  . Cardiac catheterization    . Coronary angioplasty    . Cardiac catheterization     and a  Home Meds: Prior to Admission medications   Medication Sig Start Date End Date Taking? Authorizing Provider    ALPRAZolam (XANAX) 0.5 MG tablet TAKE 1 TABLET BY MOUTH TWICE A DAY 12/10/13  Yes Amy E Bedsole, MD  aspirin EC 81 MG tablet Take 1 tablet (81 mg total) by mouth daily. 02/07/13  Yes Sueanne Margarita, MD  atorvastatin (LIPITOR) 80 MG tablet TAKE 1 TABLET BY MOUTH DAILY. 10/01/13  Yes Amy Cletis Athens, MD  clopidogrel (PLAVIX) 75 MG tablet Take 75 mg by mouth daily with breakfast.   Yes Historical Provider, MD  ferrous sulfate 325 (65 FE) MG tablet TAKE 1 TABLET BY MOUTH 3 TIMES DAILY WITH MEALS. 12/10/13  Yes Amy E Bedsole, MD  fludrocortisone (FLORINEF) 0.1 MG tablet Take 1 tablet (0.1 mg total) by mouth daily. 02/07/13  Yes Sueanne Margarita, MD  FLUOCINOLONE ACETONIDE BODY 0.01 % OIL Apply 1 application topically daily as needed (for dry skin).  06/20/13  Yes Historical Provider, MD  HYDROcodone-homatropine (HYCODAN) 5-1.5 MG/5ML syrup Take 5 mLs by mouth at bedtime as needed for cough. 09/03/13  Yes Amy Cletis Athens, MD  hydrOXYzine (ATARAX/VISTARIL) 25 MG tablet Take 50 mg by mouth at bedtime.    Yes Historical Provider, MD  midodrine (PROAMATINE) 5 MG tablet Take 1 tablet (5 mg total) by mouth 2 (two) times daily with breakfast and lunch. 09/30/13  Yes Sueanne Margarita, MD  Multiple Vitamins-Minerals (CENTRUM ADULTS PO) Take  1 tablet by mouth daily.   Yes Historical Provider, MD  nitroGLYCERIN (NITROSTAT) 0.4 MG SL tablet Place 1 tablet (0.4 mg total) under the tongue every 5 (five) minutes as needed for chest pain. 09/12/13  Yes Sueanne Margarita, MD  omega-3 acid ethyl esters (LOVAZA) 1 G capsule Take 1 g by mouth daily.  06/16/13  Yes Historical Provider, MD  sertraline (ZOLOFT) 100 MG tablet TAKE 2 TABLETS (200 MG TOTAL) BY MOUTH DAILY. 11/19/13  Yes Amy Cletis Athens, MD  vitamin B-12 (CYANOCOBALAMIN) 1000 MCG tablet Take 1,000 mcg by mouth daily.   Yes Historical Provider, MD      Allergies:  Allergies  Allergen Reactions  . Codeine Nausea Only    History   Social History  . Marital Status: Married     Spouse Name: N/A    Number of Children: N/A  . Years of Education: N/A   Occupational History  . retired Print production planner    Social History Main Topics  . Smoking status: Never Smoker   . Smokeless tobacco: Never Used  . Alcohol Use: 2.4 oz/week    4 Cans of beer per week  . Drug Use: No  . Sexual Activity: Not on file   Other Topics Concern  . Not on file   Social History Narrative   Regular exercise--yes--jog 5 miles 6 days a week     Family History  Problem Relation Age of Onset  . Coronary artery disease Mother   . Heart attack Mother   . Heart disease Mother   . Hypertension Mother   . Cancer Father     lung and colon  . Diabetes Brother      ROS:  Please see the history of present illness.     All other systems reviewed and negative.    Physical Exam: Blood pressure 124/77, pulse 61, height 6' (1.829 m), weight 160 lb 8 oz (72.802 kg). General: Well developed, well nourished male in no acute distress. Head: Normocephalic, atraumatic, sclera non-icteric, no xanthomas, nares are without discharge. EENT: normal Lymph Nodes:  none Back: without scoliosis/kyphosis, no CVA tendersness Neck: Negative for carotid bruits. JVD not elevated. Lungs: Clear bilaterally to auscultation without wheezes, rales, or rhonchi. Breathing is unlabored. Heart: RRR with S1 S2. No  murmur , rubs, or gallops appreciated. Abdomen: Soft, non-tender, non-distended with normoactive bowel sounds. No hepatomegaly. No rebound/guarding. No obvious abdominal masses. Msk:  Strength and tone appear normal for age. Extremities: No clubbing or cyanosis. No edema.  Distal pedal pulses are 2+ and equal bilaterally. Skin: Warm and Dry Neuro: Alert and oriented X 3. CN III-XII intact Grossly normal sensory and motor function . Psych:  Responds to questions appropriately with a normal affect.      Labs: Cardiac Enzymes No results found for this basename: CKTOTAL, CKMB, TROPONINI,  in the last 72  hours CBC Lab Results  Component Value Date   WBC 12.8* 09/03/2013   HGB 13.1 09/03/2013   HCT 39.5 09/03/2013   MCV 101.3* 09/03/2013   PLT 187.0 09/03/2013   PROTIME: No results found for this basename: LABPROT, INR,  in the last 72 hours Chemistry No results found for this basename: NA, K, CL, CO2, BUN, CREATININE, CALCIUM, LABALBU, PROT, BILITOT, ALKPHOS, ALT, AST, GLUCOSE,  in the last 168 hours Lipids Lab Results  Component Value Date   CHOL 164 01/24/2013   HDL 77.50 01/24/2013   LDLCALC 80 01/24/2013   TRIG 35.0 01/24/2013   BNP  No results found for this basename: probnp   Miscellaneous No results found for this basename: DDIMER    Radiology/Studies:  No results found.  EKG:    Assessment and Plan:  Orthostatic intolerance  Flutters  Sleep disorder breathingnd leg jerks  Question IgA nephropathy  We discussed extensively the physiology of orthostatic intolerance and some pharmacological and nonpharmacological interventions including abdominal binders, head of bed raising, increased salt intake and Isometric contraction  I am not sure whether there is salt wasting with his IgA nephropathy; urinalysis was not included altough his albumin level was normal  We'll plan to see him again form in 3 months time to review the impact of the above interventions     Virl Axe

## 2013-12-19 ENCOUNTER — Ambulatory Visit: Payer: 59 | Admitting: Internal Medicine

## 2013-12-19 ENCOUNTER — Telehealth: Payer: Self-pay

## 2013-12-19 NOTE — Telephone Encounter (Signed)
Patient called and stated that he needs the ICD 9 code for his insurance company to pay for the Alivcor heart monitor  ICD 9 code provided

## 2013-12-19 NOTE — Telephone Encounter (Signed)
Pt called and states he needs to procedure code for his monitor before his ins will pay.

## 2013-12-20 ENCOUNTER — Ambulatory Visit: Payer: 59 | Admitting: Family Medicine

## 2013-12-31 ENCOUNTER — Telehealth: Payer: Self-pay | Admitting: Family Medicine

## 2013-12-31 DIAGNOSIS — E785 Hyperlipidemia, unspecified: Secondary | ICD-10-CM

## 2013-12-31 DIAGNOSIS — D509 Iron deficiency anemia, unspecified: Secondary | ICD-10-CM

## 2013-12-31 DIAGNOSIS — R7309 Other abnormal glucose: Secondary | ICD-10-CM

## 2013-12-31 NOTE — Telephone Encounter (Signed)
Message copied by Jinny Sanders on Tue Dec 31, 2013 11:56 PM ------      Message from: Ellamae Sia      Created: Mon Dec 30, 2013 12:00 PM      Regarding: Lab orders for Wednesday, 9.23.15       Lab orders for regular lab appt, but, has cpx lab appt scheduled in 2 weeks. Do you want everything now? Thanks, T ------

## 2013-12-31 NOTE — Telephone Encounter (Signed)
Yes lets get everything now.

## 2014-01-01 ENCOUNTER — Other Ambulatory Visit (INDEPENDENT_AMBULATORY_CARE_PROVIDER_SITE_OTHER): Payer: 59

## 2014-01-01 ENCOUNTER — Encounter: Payer: Self-pay | Admitting: *Deleted

## 2014-01-01 DIAGNOSIS — R7309 Other abnormal glucose: Secondary | ICD-10-CM

## 2014-01-01 DIAGNOSIS — E785 Hyperlipidemia, unspecified: Secondary | ICD-10-CM

## 2014-01-01 DIAGNOSIS — I951 Orthostatic hypotension: Secondary | ICD-10-CM

## 2014-01-01 LAB — LIPID PANEL
CHOL/HDL RATIO: 3
Cholesterol: 178 mg/dL (ref 0–200)
HDL: 69.5 mg/dL (ref >39.00)
LDL Cholesterol: 99 mg/dL (ref 0–99)
NONHDL: 108.5
Triglycerides: 49 mg/dL (ref 0.0–149.0)
VLDL: 9.8 mg/dL (ref 0.0–40.0)

## 2014-01-01 LAB — COMPREHENSIVE METABOLIC PANEL
ALT: 50 U/L (ref 0–53)
AST: 52 U/L — AB (ref 0–37)
Albumin: 3.7 g/dL (ref 3.5–5.2)
Alkaline Phosphatase: 42 U/L (ref 39–117)
BUN: 26 mg/dL — AB (ref 6–23)
CALCIUM: 8.7 mg/dL (ref 8.4–10.5)
CHLORIDE: 106 meq/L (ref 96–112)
CO2: 27 mEq/L (ref 19–32)
Creatinine, Ser: 1.5 mg/dL (ref 0.4–1.5)
GFR: 51.34 mL/min — ABNORMAL LOW (ref >60.00)
Glucose, Bld: 138 mg/dL — ABNORMAL HIGH (ref 70–99)
Potassium: 4.6 mEq/L (ref 3.5–5.1)
Sodium: 138 mEq/L (ref 135–145)
Total Bilirubin: 0.4 mg/dL (ref 0.2–1.2)
Total Protein: 6 g/dL (ref 6.0–8.3)

## 2014-01-03 ENCOUNTER — Encounter: Payer: Self-pay | Admitting: Family Medicine

## 2014-01-10 ENCOUNTER — Encounter: Payer: Self-pay | Admitting: Family Medicine

## 2014-01-24 ENCOUNTER — Telehealth: Payer: Self-pay | Admitting: Family Medicine

## 2014-01-24 ENCOUNTER — Other Ambulatory Visit (INDEPENDENT_AMBULATORY_CARE_PROVIDER_SITE_OTHER): Payer: 59

## 2014-01-24 DIAGNOSIS — E785 Hyperlipidemia, unspecified: Secondary | ICD-10-CM

## 2014-01-24 DIAGNOSIS — R7303 Prediabetes: Secondary | ICD-10-CM

## 2014-01-24 DIAGNOSIS — R7309 Other abnormal glucose: Secondary | ICD-10-CM

## 2014-01-24 DIAGNOSIS — Z125 Encounter for screening for malignant neoplasm of prostate: Secondary | ICD-10-CM

## 2014-01-24 LAB — COMPREHENSIVE METABOLIC PANEL
ALBUMIN: 3.3 g/dL — AB (ref 3.5–5.2)
ALT: 30 U/L (ref 0–53)
AST: 38 U/L — ABNORMAL HIGH (ref 0–37)
Alkaline Phosphatase: 44 U/L (ref 39–117)
BUN: 26 mg/dL — AB (ref 6–23)
CHLORIDE: 105 meq/L (ref 96–112)
CO2: 26 mEq/L (ref 19–32)
Calcium: 9.4 mg/dL (ref 8.4–10.5)
Creatinine, Ser: 1.4 mg/dL (ref 0.4–1.5)
GFR: 54.75 mL/min — ABNORMAL LOW (ref >60.00)
GLUCOSE: 158 mg/dL — AB (ref 70–99)
POTASSIUM: 4.9 meq/L (ref 3.5–5.1)
Sodium: 138 mEq/L (ref 135–145)
Total Bilirubin: 0.8 mg/dL (ref 0.2–1.2)
Total Protein: 5.9 g/dL — ABNORMAL LOW (ref 6.0–8.3)

## 2014-01-24 LAB — HEMOGLOBIN A1C: Hgb A1c MFr Bld: 5.5 % (ref 4.6–6.5)

## 2014-01-24 LAB — LIPID PANEL
CHOLESTEROL: 212 mg/dL — AB (ref 0–200)
HDL: 67.8 mg/dL (ref >39.00)
LDL Cholesterol: 135 mg/dL — ABNORMAL HIGH (ref 0–99)
NonHDL: 144.2
TRIGLYCERIDES: 47 mg/dL (ref 0.0–149.0)
Total CHOL/HDL Ratio: 3
VLDL: 9.4 mg/dL (ref 0.0–40.0)

## 2014-01-24 LAB — PSA: PSA: 0.66 ng/mL (ref 0.10–4.00)

## 2014-01-24 NOTE — Telephone Encounter (Signed)
Message copied by Jinny Sanders on Fri Jan 24, 2014  8:20 AM ------      Message from: Ellamae Sia      Created: Fri Jan 17, 2014 11:46 AM      Regarding: Lab orders for Friday, 10.16.15       Patient is scheduled for CPX labs, please order future labs, Thanks , Terri       ------

## 2014-01-31 ENCOUNTER — Telehealth: Payer: Self-pay | Admitting: Cardiology

## 2014-01-31 ENCOUNTER — Ambulatory Visit (INDEPENDENT_AMBULATORY_CARE_PROVIDER_SITE_OTHER): Payer: 59 | Admitting: Family Medicine

## 2014-01-31 ENCOUNTER — Encounter: Payer: Self-pay | Admitting: Family Medicine

## 2014-01-31 VITALS — BP 104/60 | HR 67 | Temp 97.5°F | Ht 71.0 in | Wt 158.5 lb

## 2014-01-31 DIAGNOSIS — Z0000 Encounter for general adult medical examination without abnormal findings: Secondary | ICD-10-CM

## 2014-01-31 DIAGNOSIS — Z23 Encounter for immunization: Secondary | ICD-10-CM

## 2014-01-31 DIAGNOSIS — R945 Abnormal results of liver function studies: Secondary | ICD-10-CM

## 2014-01-31 DIAGNOSIS — R7303 Prediabetes: Secondary | ICD-10-CM

## 2014-01-31 DIAGNOSIS — E785 Hyperlipidemia, unspecified: Secondary | ICD-10-CM

## 2014-01-31 DIAGNOSIS — R7989 Other specified abnormal findings of blood chemistry: Secondary | ICD-10-CM

## 2014-01-31 DIAGNOSIS — R7309 Other abnormal glucose: Secondary | ICD-10-CM

## 2014-01-31 MED ORDER — ATORVASTATIN CALCIUM 40 MG PO TABS
ORAL_TABLET | ORAL | Status: DC
Start: 2014-01-31 — End: 2014-07-23

## 2014-01-31 NOTE — Progress Notes (Signed)
The patient is here for annual wellness exam and preventative care.   No curent chest pain. No SOB.  Anxiety: stable on current med.  ? IgA nephropathy:  Followed Dr. Candiss Norse. Stable    Elevated Cholesterol: NO longer At goal <70  NOW OFF 80 of lipitor.  Lab Results  Component Value Date   CHOL 212* 01/24/2014   HDL 67.80 01/24/2014   LDLCALC 135* 01/24/2014   LDLDIRECT 57 06/22/2009   TRIG 47.0 01/24/2014   CHOLHDL 3 01/24/2014  Using medications without problems:None  Muscle aches: None  Diet compliance: Good.  Exercise: Jogging daily 11 miles.  Other complaints: CAD   CAD, hx of 2 stents in 2006.[name]  He recently underwent cardiac cath for exertional chest pain that continued despite normal myocardial perfusion scan. Cath showed widely patent coronary arteries including proximal LAD and mid RCA stents with 25-30% instent restenosis of the distal margin of the LAD stent and normal LVF.    Last saw Dr. Radford Pax 09/2013  ECHO:decreased EF. Had cardiac MRI: looked okay per pt.  Dr. Caryl Comes evaluating with heart monitor for heart flutters, orthostatic intolerance ( on florinef and midodrine).  BP doing better on florinef and midodrine. BP Readings from Last 3 Encounters:  01/31/14 104/60  12/17/13 124/77  09/12/13 110/72   He has frequent waking and jerking at night. Wife states he snores a lot. Has upcoming sleep study pending.  Prediabetes:  Glucose elevated but A1C good. Lab Results  Component Value Date   HGBA1C 5.5 01/24/2014     Wt Readings from Last 3 Encounters:  01/31/14 158 lb 8 oz (71.895 kg)  12/17/13 160 lb 8 oz (72.802 kg)  09/12/13 161 lb (73.029 kg)    LFTs : Improved off lipitor, at 100-110 at check with kidney MD. Will plan on restarting at lower.. Neg hepatitis screen.  Review of Systems  Constitutional: Negative for fever, fatigue and unexpected weight change.  HENT: Negative for ear pain, congestion, sore throat, rhinorrhea, trouble swallowing and  postnasal drip.  Eyes: Negative for pain.  Respiratory: Negative for cough, shortness of breath and wheezing.  Cardiovascular: Negative for chest pain, palpitations and leg swelling.  Gastrointestinal: Negative for nausea, abdominal pain, diarrhea, constipation and blood in stool.  Genitourinary: Negative for dysuria, urgency, hematuria, discharge, penile swelling, scrotal swelling, difficulty urinating, penile pain and testicular pain.  Dark urine.. Saw uro.. Normal UA., Has had hematuria work up.  Skin: Negative for rash.  Neurological: Negative for syncope, weakness, light-headedness, numbness and headaches.  Psychiatric/Behavioral: Negative for behavioral problems and dysphoric mood. The patient is nervous/anxious.  Objective:   Physical Exam  Constitutional: He appears well-developed and well-nourished. Non-toxic appearance. He does not appear ill. No distress.  HENT:  Head: Normocephalic and atraumatic.  Right Ear: Hearing, tympanic membrane, external ear and ear canal normal.  Left Ear: Hearing, tympanic membrane, external ear and ear canal normal.  Nose: Nose normal.  Mouth/Throat: Uvula is midline, oropharynx is clear and moist and mucous membranes are normal.  Eyes: Conjunctivae normal, EOM and lids are normal. Pupils are equal, round, and reactive to light. No foreign bodies found.  Neck: Trachea normal, normal range of motion and phonation normal. Neck supple. Carotid bruit is not present. No mass and no thyromegaly present.  Cardiovascular: Normal rate, regular rhythm, S1 normal, S2 normal, intact distal pulses and normal pulses. Exam reveals no gallop.  No murmur heard.  Pulmonary/Chest: Breath sounds normal. He has no wheezes. He has no rhonchi. He  has no rales.  Abdominal: Soft. Normal appearance and bowel sounds are normal. There is no hepatosplenomegaly. There is no tenderness. There is no rebound, no guarding and no CVA tenderness. No hernia. Hernia confirmed negative in  the right inguinal area and confirmed negative in the left inguinal area.  Genitourinary: Testes normal and penis normal. Right testis shows no mass and no tenderness. Left testis shows no mass and no tenderness. No paraphimosis or penile tenderness.  Lymphadenopathy:  He has no cervical adenopathy.  Right: No inguinal adenopathy present.  Left: No inguinal adenopathy present.  Neurological: He is alert. He has normal strength and normal reflexes. No cranial nerve deficit or sensory deficit. Gait normal.  Skin: Skin is warm, dry and intact. No rash noted.  Psychiatric: He has a normal mood and affect. His speech is normal and behavior is normal. Judgment normal.  Assessment & Plan:   The patient's preventative maintenance and recommended screening tests for an annual wellness exam were reviewed in full today.  Brought up to date unless services declined.  Counselled on the importance of diet, exercise, and its role in overall health and mortality.  The patient's FH and SH was reviewed, including their home life, tobacco status, and drug and alcohol status.   Vaccines: Uptodate, flu given today.  Nonsmoker  Last DRE  06/2013 urologist Lab Results  Component Value Date   PSA 0.66 01/24/2014   PSA 0.55 01/24/2013   PSA 0.41 01/27/2012  Colon cancer screening: Colonoscopy 08/2012 nml, repeat in 5 years.

## 2014-01-31 NOTE — Telephone Encounter (Signed)
Pt st that since he is taking midodrine 5 mg BID, his dizziness has been much better. Pt st he still has symptoms though, and would like to see if Dr. Radford Pax would go up on his dosage. He had a physical today and his BP was 106/62.  Routing to Dr. Radford Pax for review and recommendations.

## 2014-01-31 NOTE — Telephone Encounter (Signed)
New message     Pt is taking midodrine 5mg  bid.  It is not working like the pt thinks it should be working.  Can he take more milligrams?

## 2014-01-31 NOTE — Progress Notes (Signed)
Pre visit review using our clinic review tool, if applicable. No additional management support is needed unless otherwise documented below in the visit note. 

## 2014-01-31 NOTE — Patient Instructions (Addendum)
Work on low carb diet to keep blood sugar under control. Restart lipitor daily at 40 mg. Return for hepatic panel in 2 weeks.

## 2014-02-03 NOTE — Telephone Encounter (Signed)
Please have him come in for a nurse visit for orthostatic BP check

## 2014-02-04 ENCOUNTER — Other Ambulatory Visit: Payer: Self-pay | Admitting: Family Medicine

## 2014-02-04 NOTE — Telephone Encounter (Signed)
Last refill 01/31/2014.  Last refilled 12/10/2013 for #60 with no refills.  Ok to refill?

## 2014-02-04 NOTE — Telephone Encounter (Signed)
Appointment made for Monday, 11-2 for orthostatic blood pressures. Patient confirms appointment time.

## 2014-02-04 NOTE — Telephone Encounter (Signed)
Called to [facility] [address]

## 2014-02-05 ENCOUNTER — Ambulatory Visit (INDEPENDENT_AMBULATORY_CARE_PROVIDER_SITE_OTHER): Payer: 59 | Admitting: Pulmonary Disease

## 2014-02-05 ENCOUNTER — Encounter: Payer: Self-pay | Admitting: Pulmonary Disease

## 2014-02-05 VITALS — BP 112/64 | HR 60 | Temp 97.3°F | Ht 71.0 in | Wt 163.0 lb

## 2014-02-05 DIAGNOSIS — G4719 Other hypersomnia: Secondary | ICD-10-CM

## 2014-02-05 DIAGNOSIS — R0683 Snoring: Secondary | ICD-10-CM

## 2014-02-05 DIAGNOSIS — G4733 Obstructive sleep apnea (adult) (pediatric): Secondary | ICD-10-CM | POA: Insufficient documentation

## 2014-02-05 NOTE — Patient Instructions (Signed)
Will schedule for a sleep study, and will call once results are available.

## 2014-02-05 NOTE — Assessment & Plan Note (Signed)
The patient has a history of loud snoring, and abnormal breathing pattern during sleep, frequent awakenings, and nonrestorative sleep with inappropriate daytime sleepiness. All of this is very suggestive of sleep-disordered breathing, however it would be uncommon for his body habitus. I have explained to him that I have many patients who are thin and have significant sleep apnea, but would also consider whether there is another diagnosis with his history of jerking at night and the suggestion of the restless leg syndrome. At this point, he will need a sleep study for evaluation, and the patient is agreeable to this approach.

## 2014-02-05 NOTE — Progress Notes (Signed)
Subjective:    Patient ID: Shawn Lam, male    DOB: 07-18-1950, 63 y.o.   MRN: [id number]  HPI The patient is a 63 year old male who I have been asked to see for snoring and inappropriate daytime sleepiness. The patient has been noted to have loud snoring according to his wife, as well as an abnormal breathing pattern during sleep. He has frequent awakenings at night, and is not rested in the mornings upon arising. The patient has seen a lot of jerking during the night, involving both his body and legs. The patient does have some symptoms that may be suggestive of restless leg syndrome, but not classic. He also has significant inappropriate daytime sleepiness with inactivity, and will get sleepy in the evenings watching television. He also describes some sleep pressure with driving longer distances. He states that his weight is actually down 10 pounds over the last few years, and his Epworth score today is 21.   Sleep Questionnaire What time do you typically go to bed?( Between what hours) 9:00 9:00 at 1322 on 02/05/14 by Len Blalock, CMA How long does it take you to fall asleep? about 5 minutes about 5 minutes at 1322 on 02/05/14 by Len Blalock, CMA How many times during the night do you wake up? 3 3 at 1322 on 02/05/14 by Len Blalock, CMA What time do you get out of bed to start your day? 0400 0400 at 1322 on 02/05/14 by Len Blalock, CMA Do you drive or operate heavy machinery in your occupation? No No at 1322 on 02/05/14 by Len Blalock, CMA How much has your weight changed (up or down) over the past two years? (In pounds) 10 lb (4.536 kg)10 lb (4.536 kg) decrease in weight at 1322 on 02/05/14 by Len Blalock, CMA Have you ever had a sleep study before? No No at 1322 on 02/05/14 by Len Blalock, CMA Do you currently use CPAP? No No at 1322 on 02/05/14 by Len Blalock, CMA Do you wear oxygen at any time? No No at 1322 on  02/05/14 by Len Blalock, CMA   Review of Systems  Constitutional: Negative for fever and unexpected weight change.  HENT: Positive for congestion. Negative for dental problem, ear pain, nosebleeds, postnasal drip, rhinorrhea, sinus pressure, sneezing, sore throat and trouble swallowing.   Eyes: Negative for redness and itching.  Respiratory: Positive for cough. Negative for chest tightness, shortness of breath and wheezing.   Cardiovascular: Negative for palpitations and leg swelling.  Gastrointestinal: Negative for nausea and vomiting.  Genitourinary: Negative for dysuria.  Musculoskeletal: Negative for joint swelling.  Skin: Negative for rash.  Neurological: Negative for headaches.  Hematological: Does not bruise/bleed easily.  Psychiatric/Behavioral: Negative for dysphoric mood. The patient is not nervous/anxious.        Objective:   Physical Exam Constitutional:  Well developed, no acute distress  HENT:  Nares patent without discharge, mild septal deviation to the left  Oropharynx without exudate, palate and uvula are mildly   Eyes:  Perrla, eomi, no scleral icterus  Neck:  No JVD, no TMG  Cardiovascular:  Normal rate, regular rhythm, no rubs or gallops.  No murmurs        Intact distal pulses  Pulmonary :  Normal breath sounds, no stridor or respiratory distress   No rales, rhonchi, or wheezing  Abdominal:  Soft, nondistended, bowel sounds present.  No tenderness noted.   Musculoskeletal:  No lower extremity  edema noted.  Lymph Nodes:  No cervical lymphadenopathy noted  Skin:  No cyanosis noted  Neurologic:  Alert, appropriate, moves all 4 extremities without obvious deficit.         Assessment & Plan:

## 2014-02-10 ENCOUNTER — Ambulatory Visit (INDEPENDENT_AMBULATORY_CARE_PROVIDER_SITE_OTHER): Payer: 59 | Admitting: *Deleted

## 2014-02-10 DIAGNOSIS — I951 Orthostatic hypotension: Secondary | ICD-10-CM

## 2014-02-10 NOTE — Patient Instructions (Signed)
Dr. Radford Pax reviewed BP.  Continue to take Midodrine 5 mg twice a day. She has spoken with Dr. Lorrene Reid and wants you to stop Enalapril. Also stop Fludrocortisone (Floriner) 0.1 mg  Take blood pressure once a day x 1 week and call Dr. Radford Pax with results.

## 2014-02-10 NOTE — Progress Notes (Signed)
1.) Reason for visit:  Orthostatic blood pressure per Dr. Radford Pax after starting Midodrine 5 mg BID  2.) Name of MD requesting visit: Dr. Radford Pax  3.) H&P: History of becoming dizzy when stands up  4.) ROS related to problem: No c/o today.  States he feels much better.  Has some dizziness when goes from lying to sitting position. Today BP                Lying after 10 min was 127/70 63; sitting (slight dizziness) 121/70 66; standing 120/76 74; after 3 min standing 126/74 80.  States the nephologist        In [place] put him on Enalapril 2.5 mg daily.  He is seeing Dr. Lorrene Reid now and she advised him to stop Enalapril until he checks with              Dr. Radford Pax. 5.) Assessment and plan per MD: Reviewed with Dr. Radford Pax.  She advises to continue taking the Midodrine. Stop Florinef. Stop Enalapril.  Take              BP every day x 1 week and call results to office.  Agree with note above.   Signed: Fransico Him, MD 02/10/2014

## 2014-02-11 ENCOUNTER — Other Ambulatory Visit: Payer: Self-pay | Admitting: Nephrology

## 2014-02-11 DIAGNOSIS — N183 Chronic kidney disease, stage 3 unspecified: Secondary | ICD-10-CM

## 2014-02-11 DIAGNOSIS — N028 Recurrent and persistent hematuria with other morphologic changes: Secondary | ICD-10-CM

## 2014-02-14 ENCOUNTER — Ambulatory Visit
Admission: RE | Admit: 2014-02-14 | Discharge: 2014-02-14 | Disposition: A | Discharge status: Home / Self Care | Payer: 59 | Source: Ambulatory Visit | Attending: Nephrology | Admitting: Nephrology

## 2014-02-14 ENCOUNTER — Telehealth (INDEPENDENT_AMBULATORY_CARE_PROVIDER_SITE_OTHER): Payer: 59 | Admitting: Family Medicine

## 2014-02-14 ENCOUNTER — Other Ambulatory Visit (INDEPENDENT_AMBULATORY_CARE_PROVIDER_SITE_OTHER): Payer: 59

## 2014-02-14 ENCOUNTER — Encounter: Payer: Self-pay | Admitting: Family Medicine

## 2014-02-14 DIAGNOSIS — N183 Chronic kidney disease, stage 3 unspecified: Secondary | ICD-10-CM

## 2014-02-14 DIAGNOSIS — R7989 Other specified abnormal findings of blood chemistry: Secondary | ICD-10-CM

## 2014-02-14 DIAGNOSIS — N028 Recurrent and persistent hematuria with other morphologic changes: Secondary | ICD-10-CM

## 2014-02-14 DIAGNOSIS — R945 Abnormal results of liver function studies: Principal | ICD-10-CM

## 2014-02-14 LAB — HEPATIC FUNCTION PANEL
ALT: 32 U/L (ref 0–53)
AST: 36 U/L (ref 0–37)
Albumin: 3.4 g/dL — ABNORMAL LOW (ref 3.5–5.2)
Alkaline Phosphatase: 42 U/L (ref 39–117)
BILIRUBIN TOTAL: 0.7 mg/dL (ref 0.2–1.2)
Bilirubin, Direct: 0 mg/dL (ref 0.0–0.3)
Total Protein: 6 g/dL (ref 6.0–8.3)

## 2014-02-14 NOTE — Telephone Encounter (Signed)
-----   Message from Ellamae Sia sent at 02/05/2014  6:48 PM EDT ----- Regarding: Lab orders for Friday, 11.6.15 Lab orders 2 week f/u

## 2014-02-17 NOTE — Telephone Encounter (Signed)
Spoke with patient about these BP readings. Will forward to Dr. Radford Pax. States he feels [place]. Sometimes gets a little dizzy upon getting up.

## 2014-02-17 NOTE — Telephone Encounter (Signed)
Follow up   Patient calling back with blood pressure reading.    11/3 108/65 , 63  11/4 101/63,  64 11/5 107/66,  64 11/6 106/66,  59 11/7 105/63,  63 11/8 105/70, 68 11/9 107/64, 63

## 2014-02-18 ENCOUNTER — Other Ambulatory Visit: Payer: Self-pay | Admitting: Family Medicine

## 2014-02-18 ENCOUNTER — Other Ambulatory Visit: Payer: Self-pay

## 2014-02-18 ENCOUNTER — Telehealth: Payer: Self-pay

## 2014-02-18 MED ORDER — FLUDROCORTISONE ACETATE 0.1 MG PO TABS: 0.1000 mg | ORAL_TABLET | Freq: Every day | ORAL | Status: DC

## 2014-02-18 NOTE — Telephone Encounter (Signed)
Please have patient start back Florinef 0.1mg  daily and check BP daily for a week and call with results.

## 2014-02-18 NOTE — Telephone Encounter (Signed)
Patient called back asking for refill to be called in for Florinef. Rx sent in.

## 2014-02-18 NOTE — Telephone Encounter (Signed)
Instructed patient to start back Florinef 0.1mg  daily and check BP daily for a week and call with results. Patient verbalized understanding.

## 2014-02-25 ENCOUNTER — Telehealth: Payer: Self-pay | Admitting: Cardiology

## 2014-02-25 NOTE — Telephone Encounter (Signed)
New message     Patient calling back with blood pressure reading.    11/11 103/64 63  11/12 104/68 60 11/13 101/60 62 11/14 107/61 62 11/15 106/64 64 11/16 107/64 65 11/17 105/61 61.

## 2014-02-25 NOTE — Telephone Encounter (Signed)
States he is a little dizzy when he first awakens, and occasionally during the day, but not nearly as bad as it was before.

## 2014-02-25 NOTE — Telephone Encounter (Signed)
Will forward these results to Dr. Radford Pax. Patient advised of this.

## 2014-02-25 NOTE — Telephone Encounter (Signed)
How is he feeling?

## 2014-02-27 DIAGNOSIS — R7989 Other specified abnormal findings of blood chemistry: Secondary | ICD-10-CM | POA: Insufficient documentation

## 2014-02-27 DIAGNOSIS — R945 Abnormal results of liver function studies: Secondary | ICD-10-CM

## 2014-02-27 NOTE — Assessment & Plan Note (Signed)
Work on low carb diet. 

## 2014-02-27 NOTE — Assessment & Plan Note (Addendum)
Restart lipitor at half dose. Will follow LFts to make sure not increasing agsain.

## 2014-02-27 NOTE — Assessment & Plan Note (Signed)
Improved off statin. Neg hepatitis screen.

## 2014-03-05 NOTE — Telephone Encounter (Signed)
Pt st he is feeling much better. Still some dizziness but not nearly as bad as before. Instructed patient to call if dizziness worsens or he has any syncope. Patient agrees with treatment plan.

## 2014-03-05 NOTE — Telephone Encounter (Signed)
Please have patient let me know if dizziness worsens or he has syncope

## 2014-03-10 ENCOUNTER — Ambulatory Visit (HOSPITAL_BASED_OUTPATIENT_CLINIC_OR_DEPARTMENT_OTHER): Payer: 59 | Attending: Pulmonary Disease

## 2014-03-10 VITALS — Ht 72.0 in | Wt 157.0 lb

## 2014-03-10 DIAGNOSIS — G4730 Sleep apnea, unspecified: Secondary | ICD-10-CM | POA: Insufficient documentation

## 2014-03-10 DIAGNOSIS — G4710 Hypersomnia, unspecified: Secondary | ICD-10-CM | POA: Diagnosis present

## 2014-03-10 DIAGNOSIS — G4719 Other hypersomnia: Secondary | ICD-10-CM

## 2014-03-10 DIAGNOSIS — Z6821 Body mass index (BMI) 21.0-21.9, adult: Secondary | ICD-10-CM | POA: Diagnosis not present

## 2014-03-10 DIAGNOSIS — R0683 Snoring: Secondary | ICD-10-CM

## 2014-03-10 DIAGNOSIS — G4733 Obstructive sleep apnea (adult) (pediatric): Secondary | ICD-10-CM

## 2014-03-16 ENCOUNTER — Emergency Department (HOSPITAL_COMMUNITY): Payer: 59

## 2014-03-16 ENCOUNTER — Emergency Department (HOSPITAL_COMMUNITY)
Admission: EM | Admit: 2014-03-16 | Discharge: 2014-03-16 | Disposition: A | Discharge status: Home / Self Care | Payer: 59 | Attending: Emergency Medicine | Admitting: Emergency Medicine

## 2014-03-16 ENCOUNTER — Encounter (HOSPITAL_COMMUNITY): Payer: Self-pay | Admitting: Emergency Medicine

## 2014-03-16 DIAGNOSIS — N201 Calculus of ureter: Secondary | ICD-10-CM | POA: Diagnosis not present

## 2014-03-16 DIAGNOSIS — Z9889 Other specified postprocedural states: Secondary | ICD-10-CM | POA: Diagnosis not present

## 2014-03-16 DIAGNOSIS — I951 Orthostatic hypotension: Secondary | ICD-10-CM | POA: Diagnosis not present

## 2014-03-16 DIAGNOSIS — Z8719 Personal history of other diseases of the digestive system: Secondary | ICD-10-CM | POA: Diagnosis not present

## 2014-03-16 DIAGNOSIS — Z7982 Long term (current) use of aspirin: Secondary | ICD-10-CM | POA: Insufficient documentation

## 2014-03-16 DIAGNOSIS — Z8669 Personal history of other diseases of the nervous system and sense organs: Secondary | ICD-10-CM | POA: Insufficient documentation

## 2014-03-16 DIAGNOSIS — N23 Unspecified renal colic: Secondary | ICD-10-CM | POA: Diagnosis not present

## 2014-03-16 DIAGNOSIS — Z7952 Long term (current) use of systemic steroids: Secondary | ICD-10-CM | POA: Insufficient documentation

## 2014-03-16 DIAGNOSIS — Z8639 Personal history of other endocrine, nutritional and metabolic disease: Secondary | ICD-10-CM | POA: Diagnosis not present

## 2014-03-16 DIAGNOSIS — Z872 Personal history of diseases of the skin and subcutaneous tissue: Secondary | ICD-10-CM | POA: Diagnosis not present

## 2014-03-16 DIAGNOSIS — N028 Recurrent and persistent hematuria with other morphologic changes: Secondary | ICD-10-CM | POA: Insufficient documentation

## 2014-03-16 DIAGNOSIS — Z9861 Coronary angioplasty status: Secondary | ICD-10-CM | POA: Insufficient documentation

## 2014-03-16 DIAGNOSIS — I2510 Atherosclerotic heart disease of native coronary artery without angina pectoris: Secondary | ICD-10-CM | POA: Diagnosis not present

## 2014-03-16 DIAGNOSIS — Z79899 Other long term (current) drug therapy: Secondary | ICD-10-CM | POA: Insufficient documentation

## 2014-03-16 DIAGNOSIS — F419 Anxiety disorder, unspecified: Secondary | ICD-10-CM | POA: Diagnosis not present

## 2014-03-16 DIAGNOSIS — Z87448 Personal history of other diseases of urinary system: Secondary | ICD-10-CM | POA: Insufficient documentation

## 2014-03-16 DIAGNOSIS — R319 Hematuria, unspecified: Secondary | ICD-10-CM

## 2014-03-16 DIAGNOSIS — R109 Unspecified abdominal pain: Secondary | ICD-10-CM | POA: Diagnosis present

## 2014-03-16 DIAGNOSIS — N132 Hydronephrosis with renal and ureteral calculous obstruction: Secondary | ICD-10-CM

## 2014-03-16 HISTORY — DX: Disorder of kidney and ureter, unspecified: N28.9

## 2014-03-16 HISTORY — DX: Recurrent and persistent hematuria with other morphologic changes: N02.8

## 2014-03-16 HISTORY — DX: Other recurrent and persistent immunoglobulin A nephropathy: N02.B9

## 2014-03-16 LAB — URINALYSIS, ROUTINE W REFLEX MICROSCOPIC
GLUCOSE, UA: NEGATIVE mg/dL
Ketones, ur: 15 mg/dL — AB
Nitrite: POSITIVE — AB
Protein, ur: 100 mg/dL — AB
Specific Gravity, Urine: 1.02 (ref 1.005–1.030)
UROBILINOGEN UA: 0.2 mg/dL (ref 0.0–1.0)
pH: 5.5 (ref 5.0–8.0)

## 2014-03-16 LAB — CBC WITH DIFFERENTIAL/PLATELET
BASOS ABS: 0 10*3/uL (ref 0.0–0.1)
BASOS PCT: 0 % (ref 0–1)
EOS ABS: 0 10*3/uL (ref 0.0–0.7)
Eosinophils Relative: 0 % (ref 0–5)
HCT: 38.9 % — ABNORMAL LOW (ref 39.0–52.0)
HEMOGLOBIN: 12.7 g/dL — AB (ref 13.0–17.0)
Lymphocytes Relative: 11 % — ABNORMAL LOW (ref 12–46)
Lymphs Abs: 0.8 10*3/uL (ref 0.7–4.0)
MCH: 32.9 pg (ref 26.0–34.0)
MCHC: 32.6 g/dL (ref 30.0–36.0)
MCV: 100.8 fL — ABNORMAL HIGH (ref 78.0–100.0)
Monocytes Absolute: 0.5 10*3/uL (ref 0.1–1.0)
Monocytes Relative: 6 % (ref 3–12)
NEUTROS ABS: 6.2 10*3/uL (ref 1.7–7.7)
NEUTROS PCT: 83 % — AB (ref 43–77)
PLATELETS: 147 10*3/uL — AB (ref 150–400)
RBC: 3.86 MIL/uL — ABNORMAL LOW (ref 4.22–5.81)
RDW: 14.2 % (ref 11.5–15.5)
WBC: 7.5 10*3/uL (ref 4.0–10.5)

## 2014-03-16 LAB — BASIC METABOLIC PANEL
ANION GAP: 16 — AB (ref 5–15)
BUN: 22 mg/dL (ref 6–23)
CHLORIDE: 106 meq/L (ref 96–112)
CO2: 21 mEq/L (ref 19–32)
Calcium: 8.7 mg/dL (ref 8.4–10.5)
Creatinine, Ser: 1.27 mg/dL (ref 0.50–1.35)
GFR, EST AFRICAN AMERICAN: 68 mL/min — AB (ref >90)
GFR, EST NON AFRICAN AMERICAN: 58 mL/min — AB (ref >90)
Glucose, Bld: 109 mg/dL — ABNORMAL HIGH (ref 70–99)
POTASSIUM: 4.9 meq/L (ref 3.7–5.3)
SODIUM: 143 meq/L (ref 137–147)

## 2014-03-16 LAB — URINE MICROSCOPIC-ADD ON

## 2014-03-16 MED ORDER — HYDROMORPHONE HCL 1 MG/ML IJ SOLN
1.0000 mg | Freq: Once | INTRAMUSCULAR | Status: AC
  Administered 2014-03-16: 1 mg via INTRAVENOUS
  Filled 2014-03-16: qty 1

## 2014-03-16 MED ORDER — OXYCODONE-ACETAMINOPHEN 5-325 MG PO TABS
1.0000 | ORAL_TABLET | Freq: Once | ORAL | Status: AC
  Administered 2014-03-16: 1 via ORAL
  Filled 2014-03-16: qty 1

## 2014-03-16 MED ORDER — HYDROMORPHONE HCL 1 MG/ML IJ SOLN
1.0000 mg | Freq: Once | INTRAMUSCULAR | Status: AC
Start: 2014-03-16 — End: 2014-03-16
  Administered 2014-03-16: 1 mg via INTRAVENOUS
  Filled 2014-03-16: qty 1

## 2014-03-16 MED ORDER — CIPROFLOXACIN HCL 500 MG PO TABS: 500.0000 mg | ORAL_TABLET | Freq: Two times a day (BID) | ORAL | Status: DC

## 2014-03-16 MED ORDER — PROMETHAZINE HCL 25 MG PO TABS: 25.0000 mg | ORAL_TABLET | Freq: Four times a day (QID) | ORAL | Status: DC | PRN

## 2014-03-16 MED ORDER — ONDANSETRON HCL 4 MG/2ML IJ SOLN
4.0000 mg | Freq: Once | INTRAMUSCULAR | Status: AC
  Administered 2014-03-16: 4 mg via INTRAVENOUS
  Filled 2014-03-16: qty 2

## 2014-03-16 MED ORDER — OXYCODONE-ACETAMINOPHEN 5-325 MG PO TABS: 1.0000 | ORAL_TABLET | ORAL | Status: DC | PRN

## 2014-03-16 MED ORDER — DEXTROSE 5 % IV SOLN
1.0000 g | Freq: Once | INTRAVENOUS | Status: AC
  Administered 2014-03-16: 1 g via INTRAVENOUS
  Filled 2014-03-16: qty 10

## 2014-03-16 NOTE — Discharge Instructions (Signed)

## 2014-03-16 NOTE — ED Provider Notes (Signed)
CSN: [id number]     Arrival date & time 03/16/14  1310 History   First MD Initiated Contact with Patient 03/16/14 1352     Chief Complaint  Patient presents with  . Flank Pain     (Consider location/radiation/quality/duration/timing/severity/associated sxs/prior Treatment) HPI    Shawn Lam is a 63 y.o. male who complains of left flank pain since early today, persistent and unrelenting.  Pain is severe, characterized as 10 over 10.  He has had hematuria preceding this for 2 days.  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  His nephrologist evaluated him with an ultrasound of the kidneys about 3 weeks ago.  He denies fever, chills, nausea, vomiting, weakness or dizziness.  No chest pain or leg pain.  He is taking his usual medications, without relief.  There are no other known modifying factors.   Past Medical History  Diagnosis Date  . Anxiety   . GERD (gastroesophageal reflux disease)   . Urticaria   . Rosacea   . Insomnia   . Hematuria   . CAD (coronary artery disease) 06/2005    PCI of LAD and RCA  . Orthostatic hypotension     negative tilt table  . Hyperlipidemia   . Asymptomatic LV dysfunction     mild with EF 48%  . Renal disorder   . Berger's disease    Past Surgical History  Procedure Laterality Date  . Cardiac catheterization    . Coronary angioplasty    . Cardiac catheterization     Family History  Problem Relation Age of Onset  . Coronary artery disease Mother   . Heart attack Mother   . Heart disease Mother   . Hypertension Mother   . Cancer Father     lung and colon  . Diabetes Brother   . Lung cancer Father     smoker   History  Substance Use Topics  . Smoking status: Never Smoker   . Smokeless tobacco: Never Used  . Alcohol Use: 2.4 oz/week    4 Cans of beer per week    Review of Systems  All other systems reviewed and are negative.     Allergies  Codeine  Home Medications   Prior to  Admission medications   Medication Sig Start Date End Date Taking? Authorizing Provider  ALPRAZolam (XANAX) 0.5 MG tablet TAKE 1 TABLET BY MOUTH TWICE A DAY 02/04/14  Yes Amy E Bedsole, MD  aspirin EC 81 MG tablet Take 1 tablet (81 mg total) by mouth daily. Patient taking differently: Take 81 mg by mouth at bedtime.  02/07/13  Yes Sueanne Margarita, MD  atorvastatin (LIPITOR) 40 MG tablet TAKE 1 TABLET BY MOUTH DAILY. Patient taking differently: Take 40 mg by mouth every morning. TAKE 1 TABLET BY MOUTH DAILY. 01/31/14  Yes Amy Cletis [place], MD  clopidogrel (PLAVIX) 75 MG tablet Take 75 mg by mouth daily with breakfast.   Yes Historical Provider, MD  ferrous sulfate 325 (65 FE) MG tablet TAKE 1 TABLET BY MOUTH 3 TIMES DAILY WITH MEALS. 12/10/13  Yes Amy E Bedsole, MD  fludrocortisone (FLORINEF) 0.1 MG tablet Take 1 tablet (0.1 mg total) by mouth daily. 02/18/14  Yes Sueanne Margarita, MD  FLUOCINOLONE ACETONIDE BODY 0.01 % OIL Apply 1 application topically daily as needed (for dry skin).  06/20/13  Yes Historical Provider, MD  hydrOXYzine (ATARAX/VISTARIL) 25 MG tablet Take 50 mg by mouth at bedtime.    Yes Historical  Provider, MD  midodrine (PROAMATINE) 5 MG tablet Take 1 tablet (5 mg total) by mouth 2 (two) times daily with breakfast and lunch. 09/30/13  Yes Sueanne Margarita, MD  Multiple Vitamins-Minerals (CENTRUM ADULTS PO) Take 1 tablet by mouth daily.   Yes Historical Provider, MD  omega-3 acid ethyl esters (LOVAZA) 1 G capsule Take 1 g by mouth daily.  06/16/13  Yes Historical Provider, MD  sertraline (ZOLOFT) 100 MG tablet TAKE 2 TABLETS (200 MG TOTAL) BY MOUTH DAILY. 02/18/14  Yes Amy Cletis [place], MD  vitamin B-12 (CYANOCOBALAMIN) 1000 MCG tablet Take 1,000 mcg by mouth daily.   Yes Historical Provider, MD  nitroGLYCERIN (NITROSTAT) 0.4 MG SL tablet Place 1 tablet (0.4 mg total) under the tongue every 5 (five) minutes as needed for chest pain. 09/12/13   Sueanne Margarita, MD   BP 123/62 mmHg  Pulse 66  Resp 13   SpO2 95% Physical Exam  Constitutional: He is oriented to person, place, and time. He appears well-developed and well-nourished. He appears distressed (He is uncomfortable with persistent facial grimacing noted.).  HENT:  Head: Normocephalic and atraumatic.  Right Ear: External ear normal.  Left Ear: External ear normal.  Eyes: Conjunctivae and EOM are normal. Pupils are equal, round, and reactive to light.  Neck: Normal range of motion and phonation normal. Neck supple.  Cardiovascular: Normal rate, regular rhythm and normal heart sounds.   Pulmonary/Chest: Effort normal and breath sounds normal. He exhibits no bony tenderness.  Abdominal: Soft. He exhibits no distension. There is no tenderness.  No abdominal tenderness, or mass.  Genitourinary:  No costovertebral angle tenderness  Musculoskeletal: Normal range of motion.  Neurological: He is alert and oriented to person, place, and time. No cranial nerve deficit or sensory deficit. He exhibits normal muscle tone. Coordination normal.  Skin: Skin is warm, dry and intact.  Psychiatric: He has a normal mood and affect. His behavior is normal. Judgment and thought content normal.  Nursing note and vitals reviewed.   ED Course  Procedures (including critical care time)  Medications  cefTRIAXone (ROCEPHIN) 1 g in dextrose 5 % 50 mL IVPB (1 g Intravenous New Bag/Given 03/16/14 1607)  ondansetron (ZOFRAN) injection 4 mg (4 mg Intravenous Given 03/16/14 1422)  HYDROmorphone (DILAUDID) injection 1 mg (1 mg Intravenous Given 03/16/14 1425)  HYDROmorphone (DILAUDID) injection 1 mg (1 mg Intravenous Given 03/16/14 1551)  ondansetron (ZOFRAN) injection 4 mg (4 mg Intravenous Given 03/16/14 1551)    Patient Vitals for the past 24 hrs:  BP Temp src Pulse Resp SpO2  03/16/14 1600 123/62 mmHg - 66 13 95 %  03/16/14 1530 136/68 mmHg - 64 17 98 %  03/16/14 1529 - - 67 12 99 %  03/16/14 1527 130/72 mmHg - - - -  03/16/14 1500 (!) 120/46 mmHg - (!)  57 - 98 %  03/16/14 1430 112/59 mmHg - 62 16 97 %  03/16/14 1400 122/70 mmHg - 61 20 100 %  03/16/14 1314 123/79 mmHg Oral 64 20 100 %    4:15 PM Reevaluation with update and discussion. After initial assessment and treatment, an updated evaluation reveals transient improvement with first dose of allergies.  He, required a second dose.  Updated patient and family members on findings at this time including obstructing kidney stone, and urinary tract infection.Daleen Bo L   16:25- discussed case with urology, Dr. Tresa Moore.  He recommends screening for elevated creatinine, prior to disposition.  If the creatinine is less than  2, the patient can be treated as a usual ureteral colic/stone, patient; with office follow-up and treatment.  Labs Review Labs Reviewed  URINALYSIS, ROUTINE W REFLEX MICROSCOPIC - Abnormal; Notable for the following:    Color, Urine RED (*)    APPearance TURBID (*)    Hgb urine dipstick LARGE (*)    Bilirubin Urine MODERATE (*)    Ketones, ur 15 (*)    Protein, ur 100 (*)    Nitrite POSITIVE (*)    Leukocytes, UA MODERATE (*)    All other components within normal limits  URINE MICROSCOPIC-ADD ON - Abnormal; Notable for the following:    Bacteria, UA MANY (*)    All other components within normal limits  URINE CULTURE  CBC WITH DIFFERENTIAL  BASIC METABOLIC PANEL    Imaging Review Ct Renal Stone Study  03/16/2014   CLINICAL DATA:  History of left flank pain and gross hematuria.  EXAM: CT ABDOMEN AND PELVIS WITHOUT CONTRAST  TECHNIQUE: Multidetector CT imaging of the abdomen and pelvis was performed following the standard protocol without IV contrast.  COMPARISON:  02/08/2013  FINDINGS: Visualization of the lower thorax demonstrates dependent atelectasis. No consolidative or nodular pulmonary opacities. Normal heart size.  Lack of intravenous contrast material limits evaluation of the solid organ parenchyma.  Liver is normal in size and contour. Gallbladder is  unremarkable. The spleen and pancreas are unremarkable. The adrenal glands are normal.  Multiple bilateral renal stones are demonstrated within the left and right kidneys measuring approximately 2-4 mm. Additionally there is an obstructing 5 mm stone within the mid aspect of the left ureter (image 40 ; series 5). There is mild left hydroureteronephrosis proximal to the stone.  Normal caliber abdominal aorta with scattered calcified atherosclerotic plaque. Urinary bladder is unremarkable. Prostate unremarkable.  Colon is decompressed. No abnormal bowel wall thickening or evidence for bowel obstruction. No free fluid or free intraperitoneal air.  No aggressive or acute appearing osseous lesions.  IMPRESSION: Obstructing 5 mm stone within the mid aspect of the left ureter resulting in mild left hydroureteronephrosis.  Bilateral nephrolithiasis.   Electronically Signed   By: Lovey Newcomer M.D.   On: 03/16/2014 15:35     EKG Interpretation None      MDM   Final diagnoses:  Ureteral stone  Ureteral colic  Hydronephrosis with urinary obstruction due to ureteral calculus  Hematuria  Berger's disease    Hematuria with urinary tract infection, and new diagnosis of kidney stone, with mid ureteral obstructing stone, today.  Nursing Notes Reviewed/ Care Coordinated, and agree without changes. Applicable Imaging Reviewed.  Interpretation of Laboratory Data incorporated into ED treatment   Care to Dr. Tawnya Crook, to disposition after return of labs, and reassess for pain control.     Richarda Blade, MD 03/18/14 2020

## 2014-03-16 NOTE — ED Provider Notes (Signed)
Care transferred from Dr. Eulis Foster patient who presented with left-sided flank pain and chronic hematuria.  She appears to have a UTI based on UA.  CT stone study with 5 mm obstructing ureteral stone.  Patient seen by Dr. Bess Harvest.  He and he will have close outpatient follow-up this coming week with potential stone manipulation.  Patient feeling improved.  Do trial of by mouth narcotics given his history of codeine in tolerace.  Pt tolerated perocoet, will send home w/ same, cipro, close outpt f/u with urology. Return precautions given for new or worsening symptoms including worsening pain, fever, inability to tolerate PO.   1. Ureteral stone   2. Ureteral colic   3. Hydronephrosis with urinary obstruction due to ureteral calculus   4. Hematuria   5. Berger's disease       Ernestina Patches, MD 03/17/14 1209

## 2014-03-16 NOTE — Consult Note (Signed)
Reason for Consult: Left Ureteral Stone  Referring Physician: E. Eulis Foster MD  Shawn Lam is an 63 y.o. male.  HPI:   1 - Left Ureteral Stone - Left 34m mid ureteral stone with mild hydro by ER CT 12/6. SSD 7cm, 500HU. Found on w/u acute left flank pain and gross hematuria. Concomitant bilateral punctate stones only. NO fevers, letukocytosis, tachycardia. Some pyuria noted on non-cath UA form ER.  2 - Hematuria - long h/o microhematuria s/p eval with imaging and Cysto by Shawn Lam previously, found to have IgA Nephropathy (Shawn Lam), mild case.   3 - Shawn Lam Disease - IgA neph[facility] nephrology eval[name]  PMH sig for CAD/Stents 2007 (on plavix, no baseline limitations, follows Shawn Lam, Cards), knee surgery.   Today "Shawn Lam is seen in consultation for above.   Past Medical History  Diagnosis Date  . Anxiety   . GERD (gastroesophageal reflux disease)   . Urticaria   . Rosacea   . Insomnia   . Hematuria   . CAD (coronary artery disease) 06/2005    PCI of LAD and RCA  . Orthostatic hypotension     negative tilt table  . Hyperlipidemia   . Asymptomatic LV dysfunction     mild with EF 48%  . Renal disorder   . Shawn Lam disease     Past Surgical History  Procedure Laterality Date  . Cardiac catheterization    . Coronary angioplasty    . Cardiac catheterization      Family History  Problem Relation Age of Onset  . Coronary artery disease Mother   . Heart attack Mother   . Heart disease Mother   . Hypertension Mother   . Cancer Father     lung and colon  . Diabetes Brother   . Lung cancer Father     smoker    Social History:  reports that he has never smoked. He has never used smokeless tobacco. He reports that he drinks about 2.4 oz of alcohol per week. He reports that he does not use illicit drugs.  Allergies:  Allergies  Allergen Reactions  . Codeine Nausea Only    Medications: I have reviewed the patient's  current medications.  Results for orders placed or performed during the hospital encounter of 03/16/14 (from the past 48 hour(s))  Urinalysis, Routine w reflex microscopic     Status: Abnormal   Collection Time: 03/16/14  2:26 PM  Result Value Ref Range   Color, Urine RED (A) YELLOW    Comment: BIOCHEMICALS MAY BE AFFECTED BY COLOR   APPearance TURBID (A) CLEAR   Specific Gravity, Urine 1.020 1.005 - 1.030   pH 5.5 5.0 - 8.0   Glucose, UA NEGATIVE NEGATIVE mg/dL   Hgb urine dipstick LARGE (A) NEGATIVE   Bilirubin Urine MODERATE (A) NEGATIVE   Ketones, ur 15 (A) NEGATIVE mg/dL   Protein, ur 100 (A) NEGATIVE mg/dL   Urobilinogen, UA 0.2 0.0 - 1.0 mg/dL   Nitrite POSITIVE (A) NEGATIVE   Leukocytes, UA MODERATE (A) NEGATIVE  Urine microscopic-add on     Status: Abnormal   Collection Time: 03/16/14  2:26 PM  Result Value Ref Range   WBC, UA 3-6 <3 WBC/hpf   RBC / HPF TOO NUMEROUS TO COUNT <3 RBC/hpf   Bacteria, UA MANY (A) RARE  CBC with Differential     Status: Abnormal   Collection Time: 03/16/14  4:22 PM  Result Value Ref Range   WBC  7.5 4.0 - 10.5 K/uL   RBC 3.86 (L) 4.22 - 5.81 MIL/uL   Hemoglobin 12.7 (L) 13.0 - 17.0 g/dL   HCT 38.9 (L) 39.0 - 52.0 %   MCV 100.8 (H) 78.0 - 100.0 fL   MCH 32.9 26.0 - 34.0 pg   MCHC 32.6 30.0 - 36.0 g/dL   RDW 14.2 11.5 - 15.5 %   Platelets 147 (L) 150 - 400 K/uL   Neutrophils Relative % 83 (H) 43 - 77 %   Neutro Abs 6.2 1.7 - 7.7 K/uL   Lymphocytes Relative 11 (L) 12 - 46 %   Lymphs Abs 0.8 0.7 - 4.0 K/uL   Monocytes Relative 6 3 - 12 %   Monocytes Absolute 0.5 0.1 - 1.0 K/uL   Eosinophils Relative 0 0 - 5 %   Eosinophils Absolute 0.0 0.0 - 0.7 K/uL   Basophils Relative 0 0 - 1 %   Basophils Absolute 0.0 0.0 - 0.1 K/uL    Ct Renal Stone Study  03/16/2014   CLINICAL DATA:  History of left flank pain and gross hematuria.  EXAM: CT ABDOMEN AND PELVIS WITHOUT CONTRAST  TECHNIQUE: Multidetector CT imaging of the abdomen and pelvis was  performed following the standard protocol without IV contrast.  COMPARISON:  02/08/2013  FINDINGS: Visualization of the lower thorax demonstrates dependent atelectasis. No consolidative or nodular pulmonary opacities. Normal heart size.  Lack of intravenous contrast material limits evaluation of the solid organ parenchyma.  Liver is normal in size and contour. Gallbladder is unremarkable. The spleen and pancreas are unremarkable. The adrenal glands are normal.  Multiple bilateral renal stones are demonstrated within the left and right kidneys measuring approximately 2-4 mm. Additionally there is an obstructing 5 mm stone within the mid aspect of the left ureter (image 40 ; series 5). There is mild left hydroureteronephrosis proximal to the stone.  Normal caliber abdominal aorta with scattered calcified atherosclerotic plaque. Urinary bladder is unremarkable. Prostate unremarkable.  Colon is decompressed. No abnormal bowel wall thickening or evidence for bowel obstruction. No free fluid or free intraperitoneal air.  No aggressive or acute appearing osseous lesions.  IMPRESSION: Obstructing 5 mm stone within the mid aspect of the left ureter resulting in mild left hydroureteronephrosis.  Bilateral nephrolithiasis.   Electronically Signed   By: Lovey Newcomer M.D.   On: 03/16/2014 15:35    Review of Systems  Constitutional: Negative.  Negative for fever and chills.  HENT: Negative.   Eyes: Negative.   Respiratory: Negative.   Cardiovascular: Negative.   Gastrointestinal: Positive for nausea. Negative for vomiting.  Genitourinary: Positive for hematuria and flank pain.  Musculoskeletal: Negative.   Skin: Negative.   Neurological: Negative.   Endo/Heme/Allergies: Negative.   Psychiatric/Behavioral: Negative.    Blood pressure 117/67, pulse 66, resp. rate 14, SpO2 99 %. Physical Exam  Constitutional: He appears well-developed and well-nourished.  Wife at bedside in ER  HENT:  Head: Normocephalic.   Eyes: Pupils are equal, round, and reactive to light.  Neck: Normal range of motion. Neck supple.  Cardiovascular: Normal rate.   GI: Soft.  Genitourinary: Penis normal.  Mild left CVAT  Musculoskeletal: Normal range of motion.  Neurological: He is alert.  Skin: Skin is warm and dry.  Psychiatric: He has a normal mood and affect. His behavior is normal. Judgment and thought content normal.    Assessment/Plan:  1 - Left Ureteral Stone - We discussed management strategies of ureteral stones including medical expulsive therapy (MET) (preferred  for stones <21m diameter), ureteroscopic stone manipulation (URS), and shockwave lithotripsy (SWL) in detail including relative risks / benefits / and efficacy. We discussed that all patients are candidates for MET as long as can keep comfortable and hydrated.   After consideration of options, the patient has decided to proceed with URS, hopefully next week with MET in interval.  UCX today and strong warnings given to return to ER / Call MD for fevers or refractory vomiting. Plan discussed with ER MD who is in agreement.   We will call him to schedule surgery.   2 - Hematuria - s/p prior eval, current episode likely form urolithiasis. Will perform cysto again at upcoming surgery.  3 - Shawn Lam Disease - mild case, continue Nephrology management.   Delorise Hunkele 03/16/2014, 5:15 PM

## 2014-03-16 NOTE — ED Notes (Signed)
Ppt. Stated, Im having terrible left side abdominal (flank) pain since this morning.  I have blood all in my urine

## 2014-03-17 ENCOUNTER — Other Ambulatory Visit: Payer: Self-pay | Admitting: Urology

## 2014-03-17 LAB — URINE CULTURE
Colony Count: NO GROWTH
Culture: NO GROWTH

## 2014-03-18 ENCOUNTER — Encounter (HOSPITAL_COMMUNITY)
Admission: RE | Admit: 2014-03-18 | Discharge: 2014-03-18 | Disposition: A | Discharge status: Home / Self Care | Payer: 59 | Source: Ambulatory Visit | Attending: Urology | Admitting: Urology

## 2014-03-18 ENCOUNTER — Ambulatory Visit: Payer: 59 | Admitting: Internal Medicine

## 2014-03-18 ENCOUNTER — Encounter (HOSPITAL_COMMUNITY): Payer: Self-pay

## 2014-03-18 DIAGNOSIS — L719 Rosacea, unspecified: Secondary | ICD-10-CM | POA: Diagnosis not present

## 2014-03-18 DIAGNOSIS — Z888 Allergy status to other drugs, medicaments and biological substances status: Secondary | ICD-10-CM | POA: Diagnosis not present

## 2014-03-18 DIAGNOSIS — I2510 Atherosclerotic heart disease of native coronary artery without angina pectoris: Secondary | ICD-10-CM | POA: Diagnosis not present

## 2014-03-18 DIAGNOSIS — Z812 Family history of tobacco abuse and dependence: Secondary | ICD-10-CM | POA: Diagnosis not present

## 2014-03-18 DIAGNOSIS — L509 Urticaria, unspecified: Secondary | ICD-10-CM | POA: Diagnosis not present

## 2014-03-18 DIAGNOSIS — Z87442 Personal history of urinary calculi: Secondary | ICD-10-CM | POA: Diagnosis not present

## 2014-03-18 DIAGNOSIS — N029 Recurrent and persistent hematuria with unspecified morphologic changes: Secondary | ICD-10-CM | POA: Diagnosis not present

## 2014-03-18 DIAGNOSIS — R319 Hematuria, unspecified: Secondary | ICD-10-CM | POA: Diagnosis not present

## 2014-03-18 DIAGNOSIS — Z800 Family history of malignant neoplasm of digestive organs: Secondary | ICD-10-CM | POA: Diagnosis not present

## 2014-03-18 DIAGNOSIS — G4700 Insomnia, unspecified: Secondary | ICD-10-CM | POA: Diagnosis not present

## 2014-03-18 DIAGNOSIS — F419 Anxiety disorder, unspecified: Secondary | ICD-10-CM | POA: Diagnosis not present

## 2014-03-18 DIAGNOSIS — Z8249 Family history of ischemic heart disease and other diseases of the circulatory system: Secondary | ICD-10-CM | POA: Diagnosis not present

## 2014-03-18 DIAGNOSIS — Z833 Family history of diabetes mellitus: Secondary | ICD-10-CM | POA: Diagnosis not present

## 2014-03-18 DIAGNOSIS — N202 Calculus of kidney with calculus of ureter: Secondary | ICD-10-CM | POA: Diagnosis present

## 2014-03-18 DIAGNOSIS — E785 Hyperlipidemia, unspecified: Secondary | ICD-10-CM | POA: Diagnosis not present

## 2014-03-18 DIAGNOSIS — G4730 Sleep apnea, unspecified: Secondary | ICD-10-CM

## 2014-03-18 DIAGNOSIS — N289 Disorder of kidney and ureter, unspecified: Secondary | ICD-10-CM | POA: Diagnosis not present

## 2014-03-18 DIAGNOSIS — Z801 Family history of malignant neoplasm of trachea, bronchus and lung: Secondary | ICD-10-CM | POA: Diagnosis not present

## 2014-03-18 HISTORY — DX: Calculus of ureter: N20.1

## 2014-03-18 NOTE — Pre-Procedure Instructions (Addendum)
PT HAD CBC,DIFF, BMET, UA, URINE CULTURE, URINE MICROSCOPIC DONE IN ER 03/16/14 - THE RESULTS ARE IN EPIC AND OK FOR SURGERY PER ANESTHESIOLOGIST'S GUIDELINES. EKG REPORT IN EPIC FROM 12/17/13. CHEST CT REPORT IN EPIC FROM 06/20/13. CARDIOLOGY OFFICE VISITS IN EPIC WITH DR. Darene Lamer. TURNER AND DR. KLEIN MRI OF HEART REPORT 11/05/13  - 2D ECHO REPORT 10/07/13  - NUCLEAR STRESS TEST REPORT 08/28/13  - ALL IN EPIC. CARDIAC CATH REPORT IN EPIC FROM 09/06/13. HAVE REQUESTED PT'S LAST NEPHROLOGIST OFFICE VISIT NOTES FROM MEDICAL RECORDS AT DR. DUNHAM'S OFFICE.

## 2014-03-18 NOTE — Progress Notes (Signed)
Pt needs ov to review sleep study.  Thanks.  

## 2014-03-18 NOTE — Progress Notes (Signed)
02/07/2014-Last office note from Dr. Lenise [facility] on chart.

## 2014-03-18 NOTE — Sleep Study (Signed)
   NAME: Shawn Lam DATE OF BIRTH:  1950-05-13 MEDICAL RECORD NUMBER [id number]  LOCATION: [facility]  PHYSICIAN: Kathee Delton  DATE OF STUDY: 03/10/2014  SLEEP STUDY TYPE: Nocturnal Polysomnogram               REFERRING PHYSICIAN: Kano Heckmann, Armando Reichert, MD  INDICATION FOR STUDY: Hypersomnia with sleep apnea  EPWORTH SLEEPINESS SCORE:  17 HEIGHT: 6' (182.9 cm)  WEIGHT: 157 lb (71.215 kg)    Body mass index is 21.29 kg/(m^2).  NECK SIZE: 15 in.  MEDICATIONS: Reviewed in the sleep record  SLEEP ARCHITECTURE: The patient had a total sleep time of 316 minutes, with no slow-wave sleep and only 9 minutes of REM. Sleep onset latency was normal at 22 minutes, and REM onset was prolonged at 284 minutes. Sleep efficiency was moderately reduced at 80%.  RESPIRATORY DATA: The patient was found to have 15 apneas and 48 obstructive hypopneas, giving him an AHI of 12 events per hour. Its occurred in all body positions, and there was very loud snoring noted throughout.  OXYGEN DATA: There was oxygen desaturation as low as 87% with the patient's obstructive events  CARDIAC DATA: Rare PAC noted  MOVEMENT/PARASOMNIA: The patient had moderate numbers of periodic leg movements, but no significant sleep disruption. There were no abnormal behaviors seen.  IMPRESSION/ RECOMMENDATION:    1) mild obstructive sleep apnea/hypopnea syndrome, with an AHI of 12 events per hour and oxygen desaturation as low as 87%. Treatment for this degree of sleep apnea can include a dental appliance, as well as C Pap.  2) rare PAC noted, but no clinically significant arrhythmias were seen.     [place], American Board of Sleep Medicine  ELECTRONICALLY SIGNED ON:  03/18/2014, 2:01 PM [place] PH: [phone]   FX: [phone] [place]

## 2014-03-18 NOTE — Patient Instructions (Addendum)
YOUR SURGERY IS SCHEDULED AT [facility]  ON:  Wednesday  December 9TH  REPORT TO  SHORT STAY CENTER AT:  11:30 AM   DO NOT EAT ANYTHING AFTER MIDNIGHT THE NIGHT BEFORE YOUR SURGERY.  NO FOOD, NO CHEWING GUM, NO MINTS, NO CANDIES, NO CHEWING TOBACCO. YOU MAY HAVE CLEAR LIQUIDS TO DRINK FROM MIDNIGHT UNTIL 7:30 AM THE DAY OF SURGERY - LIKE WATER, COFFEE ( NO MILK OR CREAM ), SODA, GRAPE JUICE.  NOTHING TO DRINK AFTER 7:30 AM DAY OF YOUR SURGERY.  PLEASE TAKE THE FOLLOWING MEDICATIONS THE AM OF YOUR SURGERY WITH A FEW SIPS OF WATER:  ALPRAZOLAM, ATORVASTATIN, ALLEGRA, FLORINEF, OXYCODONE/ACETAMINOPHEN, SERTRALINE, MIDODRINE.   DO NOT BRING VALUABLES, MONEY, CREDIT CARDS.  DO NOT WEAR JEWELRY, MAKE-UP, NAIL POLISH AND NO METAL PINS OR CLIPS IN YOUR HAIR. CONTACT LENS, DENTURES / PARTIALS, GLASSES SHOULD NOT BE WORN TO SURGERY AND IN MOST CASES-HEARING AIDS WILL NEED TO BE REMOVED.  BRING YOUR GLASSES CASE, ANY EQUIPMENT NEEDED FOR YOUR CONTACT LENS. FOR PATIENTS ADMITTED TO THE HOSPITAL--CHECK OUT TIME THE DAY OF DISCHARGE IS 11:00 AM.  ALL INPATIENT ROOMS ARE PRIVATE - WITH BATHROOM, TELEPHONE, TELEVISION AND WIFI INTERNET.  IF YOU ARE BEING DISCHARGED THE SAME DAY OF YOUR SURGERY--YOU CAN NOT DRIVE YOURSELF HOME--AND SHOULD NOT GO HOME ALONE BY TAXI OR BUS.  NO DRIVING OR OPERATING MACHINERY, OR MAKING LEGAL DECISIONS FOR 24 HOURS FOLLOWING ANESTHESIA / PAIN MEDICATIONS.  PLEASE MAKE ARRANGEMENTS FOR SOMEONE TO BE WITH YOU AT HOME THE FIRST 24 HOURS AFTER SURGERY. RESPONSIBLE DRIVER'S NAME / PHONE  PT'S WIFE WILL BE WITH HIME                                                     PLEASE BE AWARE THAT YOU MAY NEED ADDITIONAL BLOOD DRAWN DAY OF YOUR SURGERY  _______________________________________________________________________   [facility] - Preparing for Surgery Before surgery, you can play an important role.  Because skin is not sterile, your skin needs to be as free of  germs as possible.  You can reduce the number of germs on your skin by washing with CHG (chlorahexidine gluconate) soap before surgery.  CHG is an antiseptic cleaner which kills germs and bonds with the skin to continue killing germs even after washing. Please DO NOT use if you have an allergy to CHG or antibacterial soaps.  If your skin becomes reddened/irritated stop using the CHG and inform your nurse when you arrive at Short Stay. Do not shave (including legs and underarms) for at least 48 hours prior to the first CHG shower.  You may shave your face/neck. Please follow these instructions carefully:  1.  Shower with CHG Soap the night before surgery and the  morning of Surgery.  2.  If you choose to wash your hair, wash your hair first as usual with your  normal  shampoo.  3.  After you shampoo, rinse your hair and body thoroughly to remove the  shampoo.                           4.  Use CHG as you would any other liquid soap.  You can apply chg directly  to the skin and wash  Gently with a scrungie or clean washcloth.  5.  Apply the CHG Soap to your body ONLY FROM THE NECK DOWN.   Do not use on face/ open                           Wound or open sores. Avoid contact with eyes, ears mouth and genitals (private parts).                       Wash face,  Genitals (private parts) with your normal soap.             6.  Wash thoroughly, paying special attention to the area where your surgery  will be performed.  7.  Thoroughly rinse your body with warm water from the neck down.  8.  DO NOT shower/wash with your normal soap after using and rinsing off  the CHG Soap.                9.  Pat yourself dry with a clean towel.            10.  Wear clean pajamas.            11.  Place clean sheets on your bed the night of your first shower and do not  sleep with pets. Day of Surgery : Do not apply any lotions/deodorants the morning of surgery.  Please wear clean clothes to the  hospital/surgery center.  FAILURE TO FOLLOW THESE INSTRUCTIONS MAY RESULT IN THE CANCELLATION OF YOUR SURGERY PATIENT SIGNATURE_________________________________  NURSE SIGNATURE__________________________________  ________________________________________________________________________

## 2014-03-19 ENCOUNTER — Encounter (HOSPITAL_COMMUNITY): Payer: Self-pay | Admitting: *Deleted

## 2014-03-19 ENCOUNTER — Ambulatory Visit (HOSPITAL_COMMUNITY): Payer: 59 | Admitting: Certified Registered Nurse Anesthetist

## 2014-03-19 ENCOUNTER — Ambulatory Visit (HOSPITAL_COMMUNITY)
Admission: RE | Admit: 2014-03-19 | Discharge: 2014-03-19 | Disposition: A | Discharge status: Home / Self Care | Payer: 59 | Source: Ambulatory Visit | Attending: Urology | Admitting: Urology

## 2014-03-19 ENCOUNTER — Encounter (HOSPITAL_COMMUNITY)
Admission: RE | Disposition: A | Discharge status: Home / Self Care | Payer: Self-pay | Source: Ambulatory Visit | Attending: Urology

## 2014-03-19 DIAGNOSIS — Z800 Family history of malignant neoplasm of digestive organs: Secondary | ICD-10-CM | POA: Insufficient documentation

## 2014-03-19 DIAGNOSIS — Z833 Family history of diabetes mellitus: Secondary | ICD-10-CM | POA: Insufficient documentation

## 2014-03-19 DIAGNOSIS — N029 Recurrent and persistent hematuria with unspecified morphologic changes: Secondary | ICD-10-CM | POA: Insufficient documentation

## 2014-03-19 DIAGNOSIS — F419 Anxiety disorder, unspecified: Secondary | ICD-10-CM | POA: Insufficient documentation

## 2014-03-19 DIAGNOSIS — N289 Disorder of kidney and ureter, unspecified: Secondary | ICD-10-CM | POA: Insufficient documentation

## 2014-03-19 DIAGNOSIS — Z8249 Family history of ischemic heart disease and other diseases of the circulatory system: Secondary | ICD-10-CM | POA: Insufficient documentation

## 2014-03-19 DIAGNOSIS — Z888 Allergy status to other drugs, medicaments and biological substances status: Secondary | ICD-10-CM | POA: Insufficient documentation

## 2014-03-19 DIAGNOSIS — L509 Urticaria, unspecified: Secondary | ICD-10-CM | POA: Insufficient documentation

## 2014-03-19 DIAGNOSIS — R319 Hematuria, unspecified: Secondary | ICD-10-CM | POA: Insufficient documentation

## 2014-03-19 DIAGNOSIS — N202 Calculus of kidney with calculus of ureter: Secondary | ICD-10-CM | POA: Diagnosis not present

## 2014-03-19 DIAGNOSIS — Z812 Family history of tobacco abuse and dependence: Secondary | ICD-10-CM | POA: Insufficient documentation

## 2014-03-19 DIAGNOSIS — Z87442 Personal history of urinary calculi: Secondary | ICD-10-CM | POA: Insufficient documentation

## 2014-03-19 DIAGNOSIS — Z801 Family history of malignant neoplasm of trachea, bronchus and lung: Secondary | ICD-10-CM | POA: Insufficient documentation

## 2014-03-19 DIAGNOSIS — E785 Hyperlipidemia, unspecified: Secondary | ICD-10-CM | POA: Insufficient documentation

## 2014-03-19 DIAGNOSIS — I2510 Atherosclerotic heart disease of native coronary artery without angina pectoris: Secondary | ICD-10-CM | POA: Insufficient documentation

## 2014-03-19 DIAGNOSIS — L719 Rosacea, unspecified: Secondary | ICD-10-CM | POA: Insufficient documentation

## 2014-03-19 DIAGNOSIS — G4700 Insomnia, unspecified: Secondary | ICD-10-CM | POA: Insufficient documentation

## 2014-03-19 HISTORY — PX: STONE EXTRACTION WITH BASKET: SHX5318

## 2014-03-19 HISTORY — PX: CYSTOSCOPY WITH RETROGRADE PYELOGRAM, URETEROSCOPY AND STENT PLACEMENT: SHX5789

## 2014-03-19 SURGERY — CYSTOURETEROSCOPY, WITH RETROGRADE PYELOGRAM AND STENT INSERTION
Anesthesia: General | Site: Ureter | Laterality: Left

## 2014-03-19 MED ORDER — SODIUM CHLORIDE 0.9 % IR SOLN
Status: DC | PRN
  Administered 2014-03-19: 4000 mL

## 2014-03-19 MED ORDER — ONDANSETRON HCL 4 MG/2ML IJ SOLN
INTRAMUSCULAR | Status: AC
  Filled 2014-03-19: qty 2

## 2014-03-19 MED ORDER — FENTANYL CITRATE 0.05 MG/ML IJ SOLN
INTRAMUSCULAR | Status: AC
  Filled 2014-03-19: qty 2

## 2014-03-19 MED ORDER — LIDOCAINE HCL (CARDIAC) 20 MG/ML IV SOLN
INTRAVENOUS | Status: AC
  Filled 2014-03-19: qty 5

## 2014-03-19 MED ORDER — GENTAMICIN SULFATE 40 MG/ML IJ SOLN
5.0000 mg/kg | INTRAVENOUS | Status: AC
  Administered 2014-03-19: 369.5 mg via INTRAVENOUS
  Filled 2014-03-19: qty 9.25

## 2014-03-19 MED ORDER — KETOROLAC TROMETHAMINE 30 MG/ML IJ SOLN
INTRAMUSCULAR | Status: DC | PRN
  Administered 2014-03-19: 30 mg via INTRAVENOUS

## 2014-03-19 MED ORDER — PROMETHAZINE HCL 25 MG/ML IJ SOLN
6.2500 mg | INTRAMUSCULAR | Status: DC | PRN
Start: 2014-03-19 — End: 2014-03-19

## 2014-03-19 MED ORDER — GENTAMICIN IN SALINE 1.6-0.9 MG/ML-% IV SOLN: 80.0000 mg | INTRAVENOUS | Status: DC

## 2014-03-19 MED ORDER — ONDANSETRON HCL 4 MG/2ML IJ SOLN
INTRAMUSCULAR | Status: DC | PRN
  Administered 2014-03-19: 4 mg via INTRAVENOUS

## 2014-03-19 MED ORDER — EPHEDRINE SULFATE 50 MG/ML IJ SOLN
INTRAMUSCULAR | Status: DC | PRN
  Administered 2014-03-19 (×3): 10 mg via INTRAVENOUS
  Administered 2014-03-19 (×2): 5 mg via INTRAVENOUS

## 2014-03-19 MED ORDER — MIDAZOLAM HCL 2 MG/2ML IJ SOLN
INTRAMUSCULAR | Status: AC
  Filled 2014-03-19: qty 2

## 2014-03-19 MED ORDER — SENNOSIDES-DOCUSATE SODIUM 8.6-50 MG PO TABS: 1.0000 | ORAL_TABLET | Freq: Two times a day (BID) | ORAL | Status: DC

## 2014-03-19 MED ORDER — FENTANYL CITRATE 0.05 MG/ML IJ SOLN
INTRAMUSCULAR | Status: DC | PRN
Start: 2014-03-19 — End: 2014-03-19
  Administered 2014-03-19 (×2): 50 ug via INTRAVENOUS

## 2014-03-19 MED ORDER — MIDAZOLAM HCL 5 MG/5ML IJ SOLN
INTRAMUSCULAR | Status: DC | PRN
  Administered 2014-03-19: 2 mg via INTRAVENOUS

## 2014-03-19 MED ORDER — FENTANYL CITRATE 0.05 MG/ML IJ SOLN
25.0000 ug | INTRAMUSCULAR | Status: DC | PRN
  Administered 2014-03-19: 50 ug via INTRAVENOUS

## 2014-03-19 MED ORDER — PROPOFOL 10 MG/ML IV BOLUS
INTRAVENOUS | Status: AC
  Filled 2014-03-19: qty 20

## 2014-03-19 MED ORDER — KETOROLAC TROMETHAMINE 30 MG/ML IJ SOLN
INTRAMUSCULAR | Status: AC
  Filled 2014-03-19: qty 1

## 2014-03-19 MED ORDER — LIDOCAINE HCL (CARDIAC) 20 MG/ML IV SOLN
INTRAVENOUS | Status: DC | PRN
  Administered 2014-03-19: 100 mg via INTRAVENOUS

## 2014-03-19 MED ORDER — OXYCODONE-ACETAMINOPHEN 5-325 MG PO TABS: 1.0000 | ORAL_TABLET | Freq: Four times a day (QID) | ORAL | Status: DC | PRN

## 2014-03-19 MED ORDER — PROPOFOL 10 MG/ML IV BOLUS
INTRAVENOUS | Status: DC | PRN
  Administered 2014-03-19: 200 mg via INTRAVENOUS

## 2014-03-19 MED ORDER — OXYCODONE-ACETAMINOPHEN 5-325 MG PO TABS
1.0000 | ORAL_TABLET | Freq: Four times a day (QID) | ORAL | Status: DC | PRN
  Administered 2014-03-19: 1 via ORAL
  Filled 2014-03-19: qty 1

## 2014-03-19 MED ORDER — LACTATED RINGERS IV SOLN
INTRAVENOUS | Status: DC
  Administered 2014-03-19: 1000 mL via INTRAVENOUS

## 2014-03-19 MED ORDER — KETOROLAC TROMETHAMINE 30 MG/ML IJ SOLN: 15.0000 mg | Freq: Once | INTRAMUSCULAR | Status: DC | PRN

## 2014-03-19 MED ORDER — IOHEXOL 300 MG/ML  SOLN
INTRAMUSCULAR | Status: DC | PRN
  Administered 2014-03-19: 15 mL via URETHRAL

## 2014-03-19 SURGICAL SUPPLY — 21 items
BAG URINE DRAINAGE (UROLOGICAL SUPPLIES) IMPLANT
BASKET LASER NITINOL 1.9FR (BASKET) ×4 IMPLANT
BASKET STNLS GEMINI 4WIRE 3FR (BASKET) IMPLANT
BASKET ZERO TIP NITINOL 2.4FR (BASKET) IMPLANT
CATH INTERMIT  6FR 70CM (CATHETERS) ×4 IMPLANT
CLOTH BEACON ORANGE TIMEOUT ST (SAFETY) ×4 IMPLANT
DRAPE CAMERA CLOSED 9X96 (DRAPES) ×4 IMPLANT
ELECT REM PT RETURN 9FT ADLT (ELECTROSURGICAL)
ELECTRODE REM PT RTRN 9FT ADLT (ELECTROSURGICAL) IMPLANT
FIBER LASER FLEXIVA 200 (UROLOGICAL SUPPLIES) IMPLANT
FIBER LASER FLEXIVA 365 (UROLOGICAL SUPPLIES) IMPLANT
GLOVE BIOGEL M STRL SZ7.5 (GLOVE) ×12 IMPLANT
GOWN STRL REUS W/TWL LRG LVL3 (GOWN DISPOSABLE) ×8 IMPLANT
GUIDEWIRE ANG ZIPWIRE 038X150 (WIRE) ×4 IMPLANT
GUIDEWIRE STR DUAL SENSOR (WIRE) ×4 IMPLANT
IV NS IRRIG 3000ML ARTHROMATIC (IV SOLUTION) IMPLANT
PACK CYSTO (CUSTOM PROCEDURE TRAY) ×4 IMPLANT
STENT POLARIS 5FRX26 (STENTS) ×4 IMPLANT
SYRINGE 10CC LL (SYRINGE) ×4 IMPLANT
SYRINGE IRR TOOMEY STRL 70CC (SYRINGE) IMPLANT
TUBE FEEDING 8FR 16IN STR KANG (MISCELLANEOUS) ×4 IMPLANT

## 2014-03-19 NOTE — H&P (Signed)
Shawn Lam is an 63 y.o. male.    Chief Complaint: Pre-op Left Ureteroscopic Stone Manipulation  HPI:   1 - Left Ureteral Stone - Left 5mm mid ureteral stone with mild hydro by ER CT 12/6. SSD 7cm, 500HU. Found on w/u acute left flank pain and gross hematuria. Concomitant bilateral punctate stones only. NO fevers, letukocytosis, tachycardia. Some pyuria noted on non-cath UA form ER.  2 - Hematuria - long h/o microhematuria s/p eval with imaging and Cysto by [facility] previously, found to have IgA Nephropathy (Berger's), mild case.   3 - Berger's Disease - IgA nephropathy by nephrology eval, follows Jamal Maes at [facility]  PMH sig for CAD/Stents 2007 (on plavix, no baseline limitations, follows Stephens Shire MD, Cards), knee surgery.   Today "Shawn Lam" is seen to proceed with left ureteroscopic stone manipulation. UCX last visit negative. No interval fevers.    Past Medical History  Diagnosis Date  . Anxiety   . Urticaria   . Rosacea   . Insomnia   . Hematuria   . CAD (coronary artery disease) 06/2005    PCI of LAD and RCA  . Orthostatic hypotension     negative tilt table  . Hyperlipidemia   . Asymptomatic LV dysfunction     mild with EF 48%  . Renal disorder     PT IS SEEING DR. Lorrene Reid- NEPHROLOGIST  . Berger's disease   . Left ureteral stone     Past Surgical History  Procedure Laterality Date  . Left knee cap  surgery about 40 yrs ago  ? 1970    . Cardiac catheterization  09/06/2013    [facility]  . Cardiac catheterization    . Coronary angioplasty  2004    STENT PLACEMENT    Family History  Problem Relation Age of Onset  . Coronary artery disease Mother   . Heart attack Mother   . Heart disease Mother   . Hypertension Mother   . Cancer Father     lung and colon  . Diabetes Brother   . Lung cancer Father     smoker   Social History:  reports that he has never smoked. He has never used smokeless tobacco. He reports that he drinks about 2.4 oz of  alcohol per week. He reports that he does not use illicit drugs.  Allergies:  Allergies  Allergen Reactions  . Codeine Nausea Only    No prescriptions prior to admission    No results found for this or any previous visit (from the past 48 hour(s)). No results found.  Review of Systems  Constitutional: Positive for malaise/fatigue. Negative for fever and chills.  HENT: Negative.   Eyes: Negative.   Respiratory: Negative.   Cardiovascular: Negative.   Gastrointestinal: Negative.   Genitourinary: Positive for flank pain.  Musculoskeletal: Negative.   Skin: Negative.   Neurological: Negative.   Endo/Heme/Allergies: Negative.   Psychiatric/Behavioral: Negative.     There were no vitals taken for this visit. Physical Exam  Constitutional: He appears well-developed.  HENT:  Head: Normocephalic.  Eyes: Pupils are equal, round, and reactive to light.  Neck: Normal range of motion.  Cardiovascular: Normal rate.   Respiratory: Effort normal.  GI: Soft.  Genitourinary:  Moderate Left CVAT  Musculoskeletal: Normal range of motion.  Neurological: He is alert.  Skin: Skin is warm.  Psychiatric: He has a normal mood and affect. His behavior is normal.     Assessment/Plan  1 - Left Ureteral Stone - no  interval passage, proceed with left URS today. Risks and benefits discussed as well as possible need for staged approach.   2 - Hematuria - s/p prior eval, current episode likely form urolithiasis. Will perform cysto again at upcoming surgery.  3 - Berger's Disease - mild case, continue Nephrology management.  Shawn Lam 03/19/2014, 6:20 AM

## 2014-03-19 NOTE — Transfer of Care (Signed)
Immediate Anesthesia Transfer of Care Note  Patient: Shawn Lam  Procedure(s) Performed: Procedure(s) (LRB): CYSTOSCOPY WITH RETROGRADE PYELOGRAM, URETEROSCOPY AND STENT PLACEMENT (Left) STONE EXTRACTION WITH BASKET (Left)  Patient Location: PACU  Anesthesia Type: General  Level of Consciousness: sedated, patient cooperative and responds to stimulation  Airway & Oxygen Therapy: Patient Spontanous Breathing and Patient connected to face mask oxgen  Post-op Assessment: Report given to PACU RN and Post -op Vital signs reviewed and stable  Post vital signs: Reviewed and stable  Complications: No apparent anesthesia complications

## 2014-03-19 NOTE — Anesthesia Postprocedure Evaluation (Signed)
  Anesthesia Post-op Note  Patient: Shawn Lam  Procedure(s) Performed: Procedure(s) (LRB): CYSTOSCOPY WITH RETROGRADE PYELOGRAM, URETEROSCOPY AND STENT PLACEMENT (Left) STONE EXTRACTION WITH BASKET (Left)  Patient Location: PACU  Anesthesia Type: General  Level of Consciousness: awake and alert   Airway and Oxygen Therapy: Patient Spontanous Breathing  Post-op Pain: mild  Post-op Assessment: Post-op Vital signs reviewed, Patient's Cardiovascular Status Stable, Respiratory Function Stable, Patent Airway and No signs of Nausea or vomiting  Last Vitals:  Filed Vitals:   03/19/14 1600  BP: 133/63  Pulse: 70  Temp:   Resp: 12    Post-op Vital Signs: stable   Complications: No apparent anesthesia complications

## 2014-03-19 NOTE — Discharge Instructions (Signed)
1 - You may have urinary urgency (bladder spasms) and bloody urine on / off with stent in place. This is normal.  2 - Remove tethered stent on Friday morning by pulling on string, then blue-white plastic tubing, and discarding.   3-  Call MD or go to ER for fever >102, severe pain / nausea / vomiting not relieved by medications, or acute change in medical status       General Anesthesia, Care After Refer to this sheet in the next few weeks. These instructions provide you with information on caring for yourself after your procedure. Your health care provider may also give you more specific instructions. Your treatment has been planned according to current medical practices, but problems sometimes occur. Call your health care provider if you have any problems or questions after your procedure. WHAT TO EXPECT AFTER THE PROCEDURE After the procedure, it is typical to experience:  Sleepiness.  Nausea and vomiting. HOME CARE INSTRUCTIONS  For the first 24 hours after general anesthesia:  Have a responsible person with you.  Do not drive a car. If you are alone, do not take public transportation.  Do not drink alcohol.  Do not take medicine that has not been prescribed by your health care provider.  Do not sign important papers or make important decisions.  You may resume a normal diet and activities as directed by your health care provider.  Change bandages (dressings) as directed.  If you have questions or problems that seem related to general anesthesia, call the hospital and ask for the anesthetist or anesthesiologist on call. SEEK MEDICAL CARE IF:  You have nausea and vomiting that continue the day after anesthesia.  You develop a rash. SEEK IMMEDIATE MEDICAL CARE IF:   You have difficulty breathing.  You have chest pain.  You have any allergic problems. Document Released: 07/04/2000 Document Revised: 04/02/2013 Document Reviewed: 10/11/2012 [facility] Patient  Information 2015 [place], [place]. This information is not intended to replace advice given to you by your health care provider. Make sure you discuss any questions you have with your health care provider.

## 2014-03-19 NOTE — Anesthesia Preprocedure Evaluation (Signed)
Anesthesia Evaluation  Patient identified by MRN, date of birth, ID band Patient awake    Reviewed: Allergy & Precautions, H&P , NPO status , Patient's Chart, lab work & pertinent test results  Airway Mallampati: II  TM Distance: >3 FB Neck ROM: Full    Dental no notable dental hx.    Pulmonary neg pulmonary ROS,  breath sounds clear to auscultation  Pulmonary exam normal       Cardiovascular + CAD, + Cardiac Stents and +CHF Rhythm:Regular Rate:Normal  Asymptomatic LV dysfunction  mild with EF 48%     Neuro/Psych negative neurological ROS  negative psych ROS   GI/Hepatic negative GI ROS, Neg liver ROS,   Endo/Other  negative endocrine ROS  Renal/GU negative Renal ROS  negative genitourinary   Musculoskeletal negative musculoskeletal ROS (+)   Abdominal   Peds negative pediatric ROS (+)  Hematology negative hematology ROS (+)   Anesthesia Other Findings   Reproductive/Obstetrics negative OB ROS                             Anesthesia Physical Anesthesia Plan  ASA: III  Anesthesia Plan: General   Post-op Pain Management:    Induction: Intravenous  Airway Management Planned: LMA  Additional Equipment:   Intra-op Plan:   Post-operative Plan: Extubation in OR  Informed Consent: I have reviewed the patients History and Physical, chart, labs and discussed the procedure including the risks, benefits and alternatives for the proposed anesthesia with the patient or authorized representative who has indicated his/her understanding and acceptance.   Dental advisory given  Plan Discussed with: CRNA and Surgeon  Anesthesia Plan Comments:         Anesthesia Quick Evaluation

## 2014-03-19 NOTE — Brief Op Note (Signed)
03/19/2014  3:05 PM  PATIENT:  Shawn Lam  62 y.o. male  PRE-OPERATIVE DIAGNOSIS:  LEFT URETERAL STONE  POST-OPERATIVE DIAGNOSIS:  left ureteral stone  PROCEDURE:  Procedure(s): CYSTOSCOPY WITH RETROGRADE PYELOGRAM, URETEROSCOPY AND STENT PLACEMENT (Left) STONE EXTRACTION WITH BASKET (Left)  SURGEON:  Surgeon(s) and Role:    * Alexis Frock, MD - Primary  PHYSICIAN ASSISTANT:   ASSISTANTS: none   ANESTHESIA:   general  EBL:     BLOOD ADMINISTERED:none  DRAINS: none   LOCAL MEDICATIONS USED:  NONE  SPECIMEN:  Source of Specimen:  Left Ureteral and Renal Stones  DISPOSITION OF SPECIMEN:  [facility] for compositional analysis  COUNTS:  YES  TOURNIQUET:  * No tourniquets in log *  DICTATION: .Other Dictation: Dictation Number [id number]  PLAN OF CARE: Discharge to home after PACU  PATIENT DISPOSITION:  PACU - hemodynamically stable.   Delay start of Pharmacological VTE agent (>24hrs) due to surgical blood loss or risk of bleeding: not applicable

## 2014-03-19 NOTE — Progress Notes (Signed)
Notified [place] in [place] that I have called Dr. Carrie Mew of needing to sign orders and patient will need to sign consent after that is done.

## 2014-03-20 ENCOUNTER — Encounter (HOSPITAL_COMMUNITY): Payer: Self-pay | Admitting: Interventional Cardiology

## 2014-03-20 NOTE — Op Note (Signed)
NAME:  Shawn Lam, Shawn Lam NO.:  [id number]  MEDICAL RECORD NO.:  [id number]  LOCATION:  [facility]                         FACILITY:  [facility]  PHYSICIAN:  Alexis Frock, MD     DATE OF BIRTH:  1950/09/26  DATE OF PROCEDURE: 03/19/2014                                OPERATIVE REPORT   DIAGNOSIS:  Left ureteral and renal stones.  PROCEDURES: 1. Cystoscopy with left retrograde pyelogram and interpretation. 2. Left ureteroscopy with basketing of stones. 3. Left ureteral stent placement, 5 x 26 with tether to the dorsum of     the penis.  FINDINGS: 1. Mild left hydroureteronephrosis to a filling defect in midureter     consistent with known stone. 2. Dominant, ovoid, adhering stone in the midureter.  This was     amenable to simple basketing and removed in its entirety. 3. Dominant papillary tip calcifications in a lower midpole calix,     this was also removed. 4. Successful placement of left ureteral stent, proximal and renal     pelvis, distal end urinary bladder.  SPECIMEN:  Left ureteral and renal stone fragments for compositional analysis.  INDICATION:  Shawn Lam is a very pleasant 63 year old gentleman with recent history of left flank pain.  He was found on workup of this to have a left midureteral stone with mild hydroureteronephrosis.  Options were discussed for management including medical therapy only versus shockwave lithotripsy versus ureteroscopy.  The patient wished to proceed with ureteroscopic stone manipulation.  Informed consent was obtained and placed in the medical record.  Notably, he has had interval negative urine culture.  PROCEDURE IN DETAIL:  The patient being, Shawn Lam, was verified. Procedure being left ureteroscopic stone manipulation was confirmed. Procedure was carried out.  Time-out was performed.  Intravenous antibiotics were administered.  General LMA anesthesia was introduced. The patient was placed into a low lithotomy  position and sterile field was created by prepping and draping the patient's penis, perineum, and proximal thighs using iodine x3.  Next, cystourethroscopy was performed using a 22-French rigid cystoscope with 12-degree offset lens. Inspection of the anterior and posterior urethra unremarkable. Inspection of the urinary bladder revealed no diverticula, calcifications, papillary lesions.  The left ureteral orifice was cannulated with 6-French end-hole catheter and left retrograde pyelogram was obtained.  Left retrograde pyelogram demonstrated a single left ureter with single- system left kidney.  There was mild hydroureteronephrosis to the filling defect in the midureter consistent with likely known stone.  A 0.038 Glidewire was advanced at the level of the upper pole and set aside as a safety wire.  Next, semi-rigid ureteroscopy was performed of the distal left ureter alongside a separate Sensor working wire.  In the level of the midureter, the stone in question was indeed encountered.  This was predominantly ovoid in orientation and appeared to be amenable to simple basketing.  As such, an escape-type basket was used to grasp the stone on its long axis and that was carefully removed, set aside for compositional analysis.  Semi-rigid ureteroscopy was then performed of the more proximal left ureter, no additional calcifications were found. As the patient did have some papillary-tip calcifications on recent CT  scan as well as lateral stone free, a 0.038 Zip wire was advanced at the level of the renal pelvis, over which, a flexible ureteroscope was carefully advanced under fluoroscopic vision to the level of the kidney. Systematic inspection of each calix was performed x2.  There was a dominant papillary-tip calcifications in the lower midpole.  This was grasped with an escape basket and also brought out in its entirety. Final endoscopic examination of the left ureter revealed no evidence  of residual urolithiasis larger than 1/3 mm, no evidence of renal perforation.  There was excellent hemostasis.  Given the somewhat impacted nature of the stone, it was felt that temporizing stenting was warranted.  As such, a new 5 x 26 Polaris-type stent was carefully placed using fluoroscopic guidance, proximal in the renal pelvis and distal in the urinary bladder.  Tether was left in place and fashioned the dorsum of the penis.  Please note for all ureteroscopic portions of the procedure, an 8-French feeding tube was in the urinary bladder for pressure release.          ______________________________ Alexis Frock, MD     TM/MEDQ  D:  03/19/2014  T:  03/20/2014  Job:  [id number]

## 2014-03-25 ENCOUNTER — Ambulatory Visit: Payer: 59 | Admitting: Internal Medicine

## 2014-03-28 ENCOUNTER — Other Ambulatory Visit: Payer: Self-pay | Admitting: Cardiology

## 2014-03-31 ENCOUNTER — Other Ambulatory Visit: Payer: Self-pay | Admitting: Family Medicine

## 2014-03-31 ENCOUNTER — Encounter: Payer: Self-pay | Admitting: Pulmonary Disease

## 2014-03-31 ENCOUNTER — Ambulatory Visit (INDEPENDENT_AMBULATORY_CARE_PROVIDER_SITE_OTHER): Payer: 59 | Admitting: Pulmonary Disease

## 2014-03-31 ENCOUNTER — Telehealth: Payer: Self-pay | Admitting: Pulmonary Disease

## 2014-03-31 VITALS — BP 106/60 | HR 74 | Temp 97.8°F | Ht 72.0 in | Wt 167.0 lb

## 2014-03-31 DIAGNOSIS — G4733 Obstructive sleep apnea (adult) (pediatric): Secondary | ICD-10-CM

## 2014-03-31 NOTE — Assessment & Plan Note (Signed)
The patient has mild obstructive sleep apnea by his recent sleep study, and therefore this does not represent a significant risk to his cardiovascular health. His decision to treat this degree of sleep apnea should focus on its impact to he and his bed partner's quality of life.  This can be treated with a trial of positional therapy, a dental appliance, or possibly C Pap. I have reviewed all of the different therapies with him, including advantages and disadvantages. He would like to take some time to think about this, and will call me with his decision.

## 2014-03-31 NOTE — Progress Notes (Signed)
   Subjective:    Patient ID: Shawn Lam, male    DOB: 1951/03/26, 63 y.o.   MRN: [id number]  HPI The patient comes in today for follow-up of his recent sleep study. He was found to have mild OSA, with an AHI of 12 events per hour. He also had moderate numbers of periodic limb movements, but no significant arousal or awakenings. I have reviewed the study with him in detail, and answered all of his questions.   Review of Systems  Constitutional: Negative for fever and unexpected weight change.  HENT: Negative for congestion, dental problem, ear pain, nosebleeds, postnasal drip, rhinorrhea, sinus pressure, sneezing, sore throat and trouble swallowing.   Eyes: Negative for redness and itching.  Respiratory: Negative for cough, chest tightness, shortness of breath and wheezing.   Cardiovascular: Negative for palpitations and leg swelling.  Gastrointestinal: Negative for nausea and vomiting.  Genitourinary: Negative for dysuria.  Musculoskeletal: Negative for joint swelling.  Skin: Negative for rash.  Neurological: Negative for headaches.  Hematological: Does not bruise/bleed easily.  Psychiatric/Behavioral: Negative for dysphoric mood. The patient is not nervous/anxious.        Objective:   Physical Exam Thin male in no acute distress Neck without lymphadenopathy or thyromegaly Nose without purulence or discharge noted Lower extremities without edema, no cyanosis Alert and oriented, moves all 4 extremities.       Assessment & Plan:

## 2014-03-31 NOTE — Telephone Encounter (Signed)
Last office visit 01/31/2014.  Last refilled 02/04/2014 for #60 with no refills.  Ok to refill?

## 2014-03-31 NOTE — Telephone Encounter (Signed)
Spoke with the pt  He is thinking of dental appliance to tx OSA  He needs the names of dentists so he can check for ins coverage  I advised we usually refer to Dr Oneal Grout He is asking if there is anyone else, he though [facility] had mentioned someone at [facility]  Please advise, thanks!

## 2014-03-31 NOTE — Patient Instructions (Signed)
Think about options for treatment of your mild sleep apnea.  This can include positional therapy, dental appliance, and cpap.  Fortunately, this degree of sleep apnea does not increase your risk of cardiovascular disease. Please call with your decision.

## 2014-04-01 NOTE — Telephone Encounter (Signed)
Called to [facility] [address]

## 2014-04-02 NOTE — Telephone Encounter (Signed)
Spoke with pt, he is aware of recs.  Nothing further needed.  

## 2014-04-02 NOTE — Telephone Encounter (Signed)
Can check Oneal Grout in [place] in [place]

## 2014-04-29 ENCOUNTER — Ambulatory Visit: Payer: 59 | Admitting: Internal Medicine

## 2014-05-06 ENCOUNTER — Other Ambulatory Visit: Payer: Self-pay | Admitting: *Deleted

## 2014-05-06 MED ORDER — MIDODRINE HCL 5 MG PO TABS: ORAL_TABLET | ORAL | Status: DC

## 2014-05-20 ENCOUNTER — Telehealth: Payer: Self-pay | Admitting: Family Medicine

## 2014-05-20 DIAGNOSIS — R7303 Prediabetes: Secondary | ICD-10-CM

## 2014-05-20 DIAGNOSIS — E785 Hyperlipidemia, unspecified: Secondary | ICD-10-CM

## 2014-05-20 NOTE — Telephone Encounter (Signed)
-----   Message from Ellamae Sia sent at 05/15/2014  5:19 PM EST ----- Regarding: Lab orders for Wednesday, 2.10.16 Lab orders, no f/u appt

## 2014-05-21 ENCOUNTER — Other Ambulatory Visit (INDEPENDENT_AMBULATORY_CARE_PROVIDER_SITE_OTHER): Payer: 59

## 2014-05-21 DIAGNOSIS — E785 Hyperlipidemia, unspecified: Secondary | ICD-10-CM

## 2014-05-21 LAB — LIPID PANEL
CHOL/HDL RATIO: 2
CHOLESTEROL: 143 mg/dL (ref 0–200)
HDL: 71.4 mg/dL (ref >39.00)
LDL CALC: 61 mg/dL (ref 0–99)
NonHDL: 71.6
TRIGLYCERIDES: 55 mg/dL (ref 0.0–149.0)
VLDL: 11 mg/dL (ref 0.0–40.0)

## 2014-05-21 LAB — COMPREHENSIVE METABOLIC PANEL
ALBUMIN: 4 g/dL (ref 3.5–5.2)
ALT: 24 U/L (ref 0–53)
AST: 34 U/L (ref 0–37)
Alkaline Phosphatase: 59 U/L (ref 39–117)
BILIRUBIN TOTAL: 0.5 mg/dL (ref 0.2–1.2)
BUN: 22 mg/dL (ref 6–23)
CO2: 30 mEq/L (ref 19–32)
Calcium: 9.8 mg/dL (ref 8.4–10.5)
Chloride: 106 mEq/L (ref 96–112)
Creatinine, Ser: 1.2 mg/dL (ref 0.40–1.50)
GFR: 64.8 mL/min (ref >60.00)
Glucose, Bld: 93 mg/dL (ref 70–99)
Potassium: 4.7 mEq/L (ref 3.5–5.1)
SODIUM: 142 meq/L (ref 135–145)
Total Protein: 6.5 g/dL (ref 6.0–8.3)

## 2014-06-02 ENCOUNTER — Other Ambulatory Visit: Payer: Self-pay | Admitting: Family Medicine

## 2014-06-02 NOTE — Telephone Encounter (Signed)
Last office visit 01/31/2014.  Last refilled 04/01/2014 for #60 with no refills.  Ok to refill?

## 2014-06-03 NOTE — Telephone Encounter (Signed)
Called to [facility] [address]

## 2014-06-05 ENCOUNTER — Other Ambulatory Visit: Payer: Self-pay | Admitting: Family Medicine

## 2014-07-14 ENCOUNTER — Other Ambulatory Visit: Payer: Self-pay | Admitting: *Deleted

## 2014-07-14 MED ORDER — FERROUS SULFATE 325 (65 FE) MG PO TABS: ORAL_TABLET | ORAL | Status: DC

## 2014-07-14 NOTE — Telephone Encounter (Signed)
Patient called requesting a 90 day supply of his ferrous sulfate. [facility]  Prescription sent to pharmacy per patient's request.

## 2014-07-23 ENCOUNTER — Other Ambulatory Visit: Payer: Self-pay | Admitting: *Deleted

## 2014-07-23 MED ORDER — ATORVASTATIN CALCIUM 40 MG PO TABS: ORAL_TABLET | ORAL | Status: DC

## 2014-08-03 ENCOUNTER — Other Ambulatory Visit: Payer: Self-pay | Admitting: Family Medicine

## 2014-08-03 ENCOUNTER — Other Ambulatory Visit: Payer: Self-pay | Admitting: Cardiology

## 2014-08-04 ENCOUNTER — Other Ambulatory Visit: Payer: Self-pay | Admitting: *Deleted

## 2014-08-04 MED ORDER — MIDODRINE HCL 5 MG PO TABS: ORAL_TABLET | ORAL | Status: DC

## 2014-08-04 NOTE — Telephone Encounter (Signed)
Last office visit 01/31/2014.  Last refilled 06/02/2014 for #60 with no refills.  Ok to refill?

## 2014-08-04 NOTE — Telephone Encounter (Signed)
Please review for refill, [place] patient.

## 2014-08-05 ENCOUNTER — Other Ambulatory Visit: Payer: Self-pay | Admitting: Cardiology

## 2014-08-05 NOTE — Telephone Encounter (Signed)
Called to [facility] [address]

## 2014-08-07 NOTE — Progress Notes (Signed)
Cardiology Office Note   Date:  08/08/2014   ID:  Shawn Lam, DOB [birth year], MRN [id number]  PCP:  Shawn Lofts, MD    Chief Complaint  Patient presents with  . Coronary Artery Disease  . Hyperlipidemia      History of Present Illness: Shawn Lam is a 64 y.o. male with a history of ASCAD, dyslipidemia, mild LV dysfunction with EF 48% who presents today for followup. He is doing well. He denies any SOB, DOE, LE edema or syncope. His dizziness is stable on Florinef.  He now presents back today for followup. He occasionally notices a skipped heart beat.  His dizziness has improved since I saw him last.     Past Medical History  Diagnosis Date  . Anxiety   . Urticaria   . Rosacea   . Insomnia   . Hematuria   . CAD (coronary artery disease) 06/2005    PCI of LAD and RCA  . Orthostatic hypotension     negative tilt table  . Hyperlipidemia   . Asymptomatic LV dysfunction     mild with EF 48%  . Renal disorder     PT IS SEEING DR. Lorrene Lam- NEPHROLOGIST  . Berger's disease   . Left ureteral stone     Past Surgical History  Procedure Laterality Date  . Left knee cap  surgery about 40 yrs ago  ? 1970    . Cardiac catheterization  09/06/2013    [facility]  . Cardiac catheterization    . Coronary angioplasty  2004    STENT PLACEMENT  . Left heart catheterization with coronary angiogram N/A 09/06/2013    Procedure: LEFT HEART CATHETERIZATION WITH CORONARY ANGIOGRAM;  Surgeon: Shawn Grooms, MD;  Location: [facility] CATH LAB;  Service: Cardiovascular;  Laterality: N/A;  . Cystoscopy with retrograde pyelogram, ureteroscopy and stent placement Left 03/19/2014    Procedure: [place], URETEROSCOPY AND STENT PLACEMENT;  Surgeon: Shawn Dresser, MD;  Location: [facility] ORS;  Service: Urology;  Laterality: Left;  . Stone extraction with basket Left 03/19/2014    Procedure: STONE EXTRACTION WITH BASKET;  Surgeon: Shawn Dresser, MD;  Location: [facility] ORS;   Service: Urology;  Laterality: Left;     Current Outpatient Prescriptions  Medication Sig Dispense Refill  . ALPRAZolam (XANAX) 0.5 MG tablet TAKE 1 TABLET BY MOUTH TWICE A DAY 60 tablet 0  . aspirin EC 81 MG tablet Take 1 tablet (81 mg total) by mouth daily. (Patient taking differently: Take 81 mg by mouth at bedtime. ) 90 tablet 3  . atorvastatin (LIPITOR) 40 MG tablet TAKE 1 TABLET BY MOUTH DAILY. 90 tablet 1  . clopidogrel (PLAVIX) 75 MG tablet TAKE 1 TABLET BY MOUTH EVERY DAY 90 tablet 0  . ferrous sulfate 325 (65 FE) MG tablet TAKE 1 TABLET BY MOUTH 3 TIMES DAILY WITH MEALS. 270 tablet 1  . fexofenadine (ALLEGRA) 180 MG tablet Take 180 mg by mouth every morning.    . fludrocortisone (FLORINEF) 0.1 MG tablet Take 1 tablet (0.1 mg total) by mouth daily. (Patient taking differently: Take 0.1 mg by mouth every morning. ) 90 tablet 3  . FLUOCINOLONE ACETONIDE BODY 0.01 % OIL Apply 1 application topically daily as needed (for dry skin).     . hydrOXYzine (ATARAX/VISTARIL) 25 MG tablet Take 50 mg by mouth at bedtime.     . midodrine (PROAMATINE) 5 MG tablet TAKE 1 TABLET (5 MG TOTAL) BY MOUTH  2 (TWO) TIMES DAILY WITH BREAKFAST AND LUNCH. 180 tablet 6  . Multiple Vitamins-Minerals (CENTRUM ADULTS PO) Take 1 tablet by mouth every morning.     . nitroGLYCERIN (NITROSTAT) 0.4 MG SL tablet Place 1 tablet (0.4 mg total) under the tongue every 5 (five) minutes as needed for chest pain. 25 tablet 5  . omega-3 acid ethyl esters (LOVAZA) 1 G capsule Take 1 g by mouth 2 (two) times daily.     . RESTASIS 0.05 % ophthalmic emulsion   6  . sertraline (ZOLOFT) 100 MG tablet TAKE 2 TABLETS (200 MG TOTAL) BY MOUTH DAILY. 180 tablet 1  . vitamin B-12 (CYANOCOBALAMIN) 1000 MCG tablet Take 1,000 mcg by mouth at bedtime.      No current facility-administered medications for this visit.    Allergies:   Codeine and Tape    Social History:  The patient  reports that he has never smoked. He has never used  smokeless tobacco. He reports that he drinks about 2.4 oz of alcohol per week. He reports that he does not use illicit drugs.   Family History:  The patient's family history includes Cancer in his father; Coronary artery disease in his mother; Diabetes in his brother; Heart attack in his mother; Heart disease in his mother; Hypertension in his mother; Lung cancer in his father.    ROS:  Please see the history of present illness.   Otherwise, review of systems are positive for depression, back pain, anxiety, fatigue.   All other systems are reviewed and negative.    PHYSICAL EXAM: VS:  BP 104/68 mmHg  Pulse 64  Ht 6' (1.829 m)  Wt 155 lb (70.308 kg)  BMI 21.02 kg/m2 , BMI Body mass index is 21.02 kg/(m^2). GEN: Well nourished, well developed, in no acute distress HEENT: normal Neck: no JVD, carotid bruits, or masses Cardiac: RRR; no murmurs, rubs, or gallops,no edema  Respiratory:  clear to auscultation bilaterally, normal work of breathing GI: soft, nontender, nondistended, + BS MS: no deformity or atrophy Skin: warm and dry, no rash Neuro:  Strength and sensation are intact Psych: euthymic mood, full affect   EKG:  EKG is not ordered today.    Recent Labs: 03/16/2014: Hemoglobin 12.7*; Platelets 147* 05/21/2014: ALT 24; BUN 22; Creatinine 1.20; Potassium 4.7; Sodium 142    Lipid Panel    Component Value Date/Time   CHOL 143 05/21/2014 0912   TRIG 55.0 05/21/2014 0912   HDL 71.40 05/21/2014 0912   CHOLHDL 2 05/21/2014 0912   VLDL 11.0 05/21/2014 0912   LDLCALC 61 05/21/2014 0912   LDLDIRECT 57 06/22/2009 1228      Wt Readings from Last 3 Encounters:  08/08/14 155 lb (70.308 kg)  03/31/14 167 lb (75.751 kg)  03/19/14 163 lb (73.936 kg)      ASSESSMENT AND PLAN:  1. ASCAD -  cath showed widely patent stents.  - continue ASA/Plavix/statin 2. Dyslipidemia  - LDL at goal at 61 - continue atorvastatin  3. Orthostatic hypotension - well controlled and dizziness  significantly improved - continue Florinef/midodrine   Current medicines are reviewed at length with the patient today.  The patient does not have concerns regarding medicines.  The following changes have been made:  no change  Labs/ tests ordered today include: see above assessment and plan No orders of the defined types were placed in this encounter.     Disposition:   FU with me in 6 months   Signed, Sueanne Margarita, MD  08/08/2014 9:28 AM    [facility] [place], [place], [place]  [address] Phone: [phone]; Fax: [phone]

## 2014-08-08 ENCOUNTER — Encounter: Payer: Self-pay | Admitting: Cardiology

## 2014-08-08 ENCOUNTER — Ambulatory Visit (INDEPENDENT_AMBULATORY_CARE_PROVIDER_SITE_OTHER): Payer: 59 | Admitting: Cardiology

## 2014-08-08 VITALS — BP 104/68 | HR 64 | Ht 72.0 in | Wt 155.0 lb

## 2014-08-08 DIAGNOSIS — E785 Hyperlipidemia, unspecified: Secondary | ICD-10-CM

## 2014-08-08 DIAGNOSIS — I951 Orthostatic hypotension: Secondary | ICD-10-CM | POA: Diagnosis not present

## 2014-08-08 DIAGNOSIS — I2510 Atherosclerotic heart disease of native coronary artery without angina pectoris: Secondary | ICD-10-CM | POA: Diagnosis not present

## 2014-08-08 MED ORDER — CLOPIDOGREL BISULFATE 75 MG PO TABS: 75.0000 mg | ORAL_TABLET | Freq: Every day | ORAL | Status: DC

## 2014-08-08 NOTE — Patient Instructions (Addendum)
Medication Instructions:  Your physician recommends that you continue on your current medications as directed. Please refer to the Current Medication list given to you today.   Labwork: None  Testing/Procedures: None  Follow-Up: Your physician wants you to follow-up in: 6 months with Dr. Turner. You will receive a reminder letter in the mail two months in advance. If you don't receive a letter, please call our office to schedule the follow-up appointment.   Any Other Special Instructions Will Be Listed Below (If Applicable).   

## 2014-08-17 ENCOUNTER — Other Ambulatory Visit: Payer: Self-pay | Admitting: Family Medicine

## 2014-08-18 NOTE — Telephone Encounter (Signed)
Received refill request electronically from pharmacy Last refill 02/18/14 #180/1 Last office visit 01/31/14 Is it okay to refill?

## 2014-09-29 ENCOUNTER — Other Ambulatory Visit: Payer: Self-pay | Admitting: Family Medicine

## 2014-09-29 NOTE — Telephone Encounter (Signed)
CPE 01/31/14, last filled #60 08/04/14.

## 2014-09-30 NOTE — Telephone Encounter (Signed)
Called to pharmacy 

## 2014-10-01 ENCOUNTER — Other Ambulatory Visit: Payer: Self-pay | Admitting: Family Medicine

## 2014-11-13 ENCOUNTER — Encounter: Payer: Self-pay | Admitting: Cardiology

## 2014-12-01 ENCOUNTER — Other Ambulatory Visit: Payer: Self-pay | Admitting: Family Medicine

## 2014-12-01 NOTE — Telephone Encounter (Signed)
Called into [facility] [address]

## 2014-12-01 NOTE — Telephone Encounter (Signed)
Last office visit 02/01/2015.  Last refilled 09/29/2014 for #60 with no refills.  Ok to refill?

## 2014-12-28 ENCOUNTER — Other Ambulatory Visit: Payer: Self-pay | Admitting: Family Medicine

## 2015-01-21 ENCOUNTER — Other Ambulatory Visit: Payer: Self-pay | Admitting: Family Medicine

## 2015-01-27 ENCOUNTER — Other Ambulatory Visit (INDEPENDENT_AMBULATORY_CARE_PROVIDER_SITE_OTHER): Payer: 59

## 2015-01-27 ENCOUNTER — Telehealth: Payer: Self-pay | Admitting: Family Medicine

## 2015-01-27 DIAGNOSIS — E785 Hyperlipidemia, unspecified: Secondary | ICD-10-CM | POA: Diagnosis not present

## 2015-01-27 DIAGNOSIS — R7303 Prediabetes: Secondary | ICD-10-CM

## 2015-01-27 LAB — LIPID PANEL
CHOL/HDL RATIO: 2
Cholesterol: 176 mg/dL (ref 0–200)
HDL: 96.2 mg/dL (ref >39.00)
LDL Cholesterol: 70 mg/dL (ref 0–99)
NonHDL: 80.15
Triglycerides: 51 mg/dL (ref 0.0–149.0)
VLDL: 10.2 mg/dL (ref 0.0–40.0)

## 2015-01-27 LAB — COMPREHENSIVE METABOLIC PANEL
ALT: 24 U/L (ref 0–53)
AST: 38 U/L — ABNORMAL HIGH (ref 0–37)
Albumin: 4.3 g/dL (ref 3.5–5.2)
Alkaline Phosphatase: 54 U/L (ref 39–117)
BUN: 25 mg/dL — AB (ref 6–23)
CHLORIDE: 105 meq/L (ref 96–112)
CO2: 27 mEq/L (ref 19–32)
CREATININE: 1.26 mg/dL (ref 0.40–1.50)
Calcium: 9.7 mg/dL (ref 8.4–10.5)
GFR: 61.12 mL/min (ref >60.00)
Glucose, Bld: 112 mg/dL — ABNORMAL HIGH (ref 70–99)
Potassium: 4.1 mEq/L (ref 3.5–5.1)
Sodium: 141 mEq/L (ref 135–145)
Total Bilirubin: 0.6 mg/dL (ref 0.2–1.2)
Total Protein: 6.7 g/dL (ref 6.0–8.3)

## 2015-01-27 LAB — HEMOGLOBIN A1C: Hgb A1c MFr Bld: 5.6 % (ref 4.6–6.5)

## 2015-01-27 NOTE — Telephone Encounter (Signed)
-----   Message from Ellamae Sia sent at 01/21/2015  3:39 PM EDT ----- Regarding: Lab orders for Tuesday, 10.18.16 Patient is scheduled for CPX labs, please order future labs, Thanks , Karna Christmas

## 2015-02-02 ENCOUNTER — Other Ambulatory Visit: Payer: Self-pay | Admitting: Family Medicine

## 2015-02-02 NOTE — Telephone Encounter (Signed)
Last office visit 01/31/2014.   CPE scheduled 02/03/2015.  Last refilled 12/01/2014 for #60 with no refills.  Ok to refill?

## 2015-02-03 ENCOUNTER — Encounter: Payer: Self-pay | Admitting: Family Medicine

## 2015-02-03 ENCOUNTER — Ambulatory Visit (INDEPENDENT_AMBULATORY_CARE_PROVIDER_SITE_OTHER): Payer: 59 | Admitting: Family Medicine

## 2015-02-03 VITALS — BP 90/58 | HR 71 | Temp 97.7°F | Ht 70.5 in | Wt 151.2 lb

## 2015-02-03 DIAGNOSIS — Z23 Encounter for immunization: Secondary | ICD-10-CM | POA: Diagnosis not present

## 2015-02-03 DIAGNOSIS — E785 Hyperlipidemia, unspecified: Secondary | ICD-10-CM

## 2015-02-03 DIAGNOSIS — Z125 Encounter for screening for malignant neoplasm of prostate: Secondary | ICD-10-CM

## 2015-02-03 DIAGNOSIS — Z0000 Encounter for general adult medical examination without abnormal findings: Secondary | ICD-10-CM | POA: Diagnosis not present

## 2015-02-03 DIAGNOSIS — R7989 Other specified abnormal findings of blood chemistry: Secondary | ICD-10-CM

## 2015-02-03 DIAGNOSIS — R945 Abnormal results of liver function studies: Secondary | ICD-10-CM

## 2015-02-03 DIAGNOSIS — R7303 Prediabetes: Secondary | ICD-10-CM

## 2015-02-03 DIAGNOSIS — F411 Generalized anxiety disorder: Secondary | ICD-10-CM

## 2015-02-03 LAB — PSA: PSA: 0.61 ng/mL (ref 0.10–4.00)

## 2015-02-03 MED ORDER — ALPRAZOLAM 0.5 MG PO TABS
0.5000 mg | ORAL_TABLET | Freq: Every day | ORAL | Status: DC | PRN
Start: 2015-02-03 — End: 2015-03-02

## 2015-02-03 NOTE — Progress Notes (Signed)
The patient is here for annual wellness exam and preventative care.   Anxiety: stable on zoloft 200 mg daily and alprazolam 0.5 mg once daily prn. Still gets irritated a lot.  Refused counseling at this time.  ? IgA nephropathy: Followed Dr. Lorrene Reid. Stable.  Elevated Cholesterol: Now back at goal <70on  only 1/2 dose ( 40 mg) lipitor.  Lab Results  Component Value Date   CHOL 176 01/27/2015   HDL 96.20 01/27/2015   LDLCALC 70 01/27/2015   LDLDIRECT 57 06/22/2009   TRIG 51.0 01/27/2015   CHOLHDL 2 01/27/2015  Using medications without problems:None  Muscle aches: None  Diet compliance: Good.  Exercise: Jogging daily 9 miles Other complaints: CAD   CAD, hx of 2 stents in 2006..  08/2013 underwent cardiac cath for exertional chest pain that continued despite normal myocardial perfusion scan. Cath showed widely patent coronary arteries including proximal LAD and mid RCA stents with 25-30% instent restenosis of the distal margin of the LAD stent and normal LVF.  Last saw Dr. Radford Pax 07/2014, also has follow up this week with her. ECHO 2014:decreased EF. Had cardiac MRI: looked okay per pt.  Dr. Caryl Comes evaluating with heart monitor for heart flutters, orthostatic intolerance ( on florinef and midodrine).  BP doing better on florinef and midodrine. BP Readings from Last 3 Encounters:  02/03/15 90/58  08/08/14 104/68  03/31/14 106/60   Prediabetes: Glucose elevated but A1C good. Lab Results  Component Value Date   HGBA1C 5.6 01/27/2015   LFTs : slight increase on 40 mg  Lipitor, but not as high as on 80 mg daily  Neg hepatitis screen. Wt Readings from Last 3 Encounters:  02/03/15 151 lb 4 oz (68.607 kg)  08/08/14 155 lb (70.308 kg)  03/31/14 167 lb (75.751 kg)   Body mass index is 21.39 kg/(m^2).  Social History /Family History/Past Medical History reviewed and updated if needed.  Review of Systems  Constitutional: Negative for fever, fatigue and unexpected weight  change.  HENT: Negative for ear pain, congestion, sore throat, rhinorrhea, trouble swallowing and postnasal drip.  Eyes: Negative for pain.  Respiratory: Negative for cough, shortness of breath and wheezing.  Cardiovascular: Negative for chest pain, palpitations and leg swelling.  Gastrointestinal: Negative for nausea, abdominal pain, diarrhea, constipation and blood in stool.  Genitourinary: Negative for dysuria, urgency, hematuria, discharge, penile swelling, scrotal swelling, difficulty urinating, penile pain and testicular pain.  Skin: Negative for rash.  Neurological: Negative for syncope, weakness, light-headedness, numbness and headaches.  Psychiatric/Behavioral: Negative for behavioral problems and dysphoric mood. The patient is nervous/anxious.  Objective:   Physical Exam  Constitutional: He appears well-developed and well-nourished. Non-toxic appearance. He does not appear ill. No distress.  HENT:  Head: Normocephalic and atraumatic.  Right Ear: Hearing, tympanic membrane, external ear and ear canal normal.  Left Ear: Hearing, tympanic membrane, external ear and ear canal normal.  Nose: Nose normal.  Mouth/Throat: Uvula is midline, oropharynx is clear and moist and mucous membranes are normal.  Eyes: Conjunctivae normal, EOM and lids are normal. Pupils are equal, round, and reactive to light. No foreign bodies found.  Neck: Trachea normal, normal range of motion and phonation normal. Neck supple. Carotid bruit is not present. No mass and no thyromegaly present.  Cardiovascular: Normal rate, regular rhythm, S1 normal, S2 normal, intact distal pulses and normal pulses. Exam reveals no gallop.  No murmur heard.  Pulmonary/Chest: Breath sounds normal. He has no wheezes. He has no rhonchi. He has no rales.  Abdominal: Soft. Normal appearance and bowel sounds are normal. There is no hepatosplenomegaly. There is no tenderness. There is no rebound, no guarding and no  CVA tenderness. No hernia. Hernia confirmed negative in the right inguinal area and confirmed negative in the left inguinal area.  Genitourinary: Testes normal and penis normal. Right testis shows no mass and no tenderness. Left testis shows no mass and no tenderness. No paraphimosis or penile tenderness.  Lymphadenopathy:  He has no cervical adenopathy.  Right: No inguinal adenopathy present.  Left: No inguinal adenopathy present.  Neurological: He is alert. He has normal strength and normal reflexes. No cranial nerve deficit or sensory deficit. Gait normal.  Skin: Skin is warm, dry and intact. No rash noted.  Psychiatric: He has a normal mood and affect. His speech is normal and behavior is normal. Judgment normal.  Assessment & Plan:   The patient's preventative maintenance and recommended screening tests for an annual wellness exam were reviewed in full today.  Brought up to date unless services declined.  Counselled on the importance of diet, exercise, and its role in overall health and mortality.  The patient's FH and SH was reviewed, including their home life, tobacco status, and drug and alcohol status.   Vaccines: Uptodate, flu given today.  Nonsmoker  Last DRE 03/2014  PSA due today Lab Results  Component Value Date   PSA 0.66 01/24/2014   PSA 0.55 01/24/2013   PSA 0.41 01/27/2012  Colon cancer screening: Colonoscopy 08/2012 nml, repeat in 5 years. Hep C: done with eval for increase LFTs.  HIV: neg test 2 years ago.

## 2015-02-03 NOTE — Assessment & Plan Note (Signed)
Stable control on zoloft 200 gm daily and alprazolam ( only using once daily)

## 2015-02-03 NOTE — Assessment & Plan Note (Signed)
Resolved on lower dose statin.

## 2015-02-03 NOTE — Assessment & Plan Note (Signed)
At goal on 40 mg daily of liptior.

## 2015-02-03 NOTE — Assessment & Plan Note (Signed)
Improved control. 

## 2015-02-03 NOTE — Telephone Encounter (Signed)
Rx printed and given to patient at his appointment with Dr. Diona Browner on 02/03/2015.

## 2015-02-03 NOTE — Patient Instructions (Addendum)
Stop at lab on way put for PSA.  Continue  healthy diet, weight maintenance, regular exercise.

## 2015-02-03 NOTE — Progress Notes (Signed)
Pre visit review using our clinic review tool, if applicable. No additional management support is needed unless otherwise documented below in the visit note. 

## 2015-02-04 ENCOUNTER — Encounter: Payer: Self-pay | Admitting: *Deleted

## 2015-02-04 NOTE — Progress Notes (Signed)
Cardiology Office Note   Date:  02/05/2015   ID:  Shawn Lam, DOB [birth year], MRN [id number]  PCP:  [name], MD    Chief Complaint  Patient presents with  . Coronary Artery Disease  . Hyperlipidemia      History of Present Illness: Shawn Lam is a 64 y.o. male with a history of ASCAD, dyslipidemia, mild LV dysfunction with EF 48% who presents today for followup. He is doing well. He denies any chest pain, SOB, DOE, LE edema or syncope. His dizziness is stable on Florinef and proamatine. He now presents back today for followup. He occasionally notices a skipped heart beat. His dizziness has improved since I saw him last.     Past Medical History  Diagnosis Date  . Anxiety   . Urticaria   . Rosacea   . Insomnia   . Hematuria   . CAD (coronary artery disease) 06/2005    PCI of LAD and RCA  . Orthostatic hypotension     negative tilt table  . Hyperlipidemia   . Asymptomatic LV dysfunction     mild with EF 48%  . Renal disorder     PT IS SEEING DR. Lorrene Reid- NEPHROLOGIST  . Berger's disease   . Left ureteral stone     Past Surgical History  Procedure Laterality Date  . Left knee cap  surgery about 40 yrs ago  ? 1970    . Cardiac catheterization  09/06/2013    [facility]  . Cardiac catheterization    . Coronary angioplasty  2004    STENT PLACEMENT  . Left heart catheterization with coronary angiogram N/A 09/06/2013    Procedure: LEFT HEART CATHETERIZATION WITH CORONARY ANGIOGRAM;  Surgeon: Sinclair Grooms, MD;  Location: [facility] CATH LAB;  Service: Cardiovascular;  Laterality: N/A;  . Cystoscopy with retrograde pyelogram, ureteroscopy and stent placement Left 03/19/2014    Procedure: [place], URETEROSCOPY AND STENT PLACEMENT;  Surgeon: Junious Dresser, MD;  Location: [facility] ORS;  Service: Urology;  Laterality: Left;  . Stone extraction with basket Left 03/19/2014    Procedure: STONE EXTRACTION WITH BASKET;  Surgeon:  Junious Dresser, MD;  Location: [facility] ORS;  Service: Urology;  Laterality: Left;     Current Outpatient Prescriptions  Medication Sig Dispense Refill  . ALPRAZolam (XANAX) 0.5 MG tablet Take 1 tablet (0.5 mg total) by mouth daily as needed for anxiety. 30 tablet 0  . aspirin 81 MG tablet Take 81 mg by mouth daily.    [name] atorvastatin (LIPITOR) 40 MG tablet TAKE 1 TABLET BY MOUTH DAILY. 90 tablet 0  . clopidogrel (PLAVIX) 75 MG tablet Take 1 tablet (75 mg total) by mouth daily. 90 tablet 3  . ferrous sulfate 325 (65 FE) MG tablet TAKE 1 TABLET BY MOUTH 3 TIMES DAILY WITH MEALS. 270 tablet 0  . fludrocortisone (FLORINEF) 0.1 MG tablet Take 1 tablet (0.1 mg total) by mouth daily. (Patient taking differently: Take 0.1 mg by mouth every morning. ) 90 tablet 3  . FLUOCINOLONE ACETONIDE BODY 0.01 % OIL Apply 1 application topically daily as needed (for dry skin).     . hydrOXYzine (ATARAX/VISTARIL) 25 MG tablet Take 50 mg by mouth at bedtime.     . midodrine (PROAMATINE) 5 MG tablet TAKE 1 TABLET (5 MG TOTAL) BY MOUTH 2 (TWO) TIMES DAILY WITH BREAKFAST AND LUNCH. 180  tablet 6  . Multiple Vitamins-Minerals (CENTRUM ADULTS PO) Take 1 tablet by mouth every morning.     . nitroGLYCERIN (NITROSTAT) 0.4 MG SL tablet Place 1 tablet (0.4 mg total) under the tongue every 5 (five) minutes as needed for chest pain. 25 tablet 5  . omega-3 acid ethyl esters (LOVAZA) 1 G capsule Take 1 g by mouth 2 (two) times daily.     . RESTASIS 0.05 % ophthalmic emulsion   6  . sertraline (ZOLOFT) 100 MG tablet TAKE 2 TABLETS (200 MG TOTAL) BY MOUTH DAILY. 180 tablet 1  . vitamin B-12 (CYANOCOBALAMIN) 1000 MCG tablet Take 1,000 mcg by mouth at bedtime.      No current facility-administered medications for this visit.    Allergies:   Codeine and Tape    Social History:  The patient  reports that he has never smoked. He has never used smokeless tobacco. He reports that he drinks about 2.4 oz of alcohol per week. He reports  that he does not use illicit drugs.   Family History:  The patient's family history includes Cancer in his father; Coronary artery disease in his mother; Diabetes in his brother; Heart attack in his mother; Heart disease in his mother; Hypertension in his mother; Lung cancer in his father.    ROS:  Please see the history of present illness.   Otherwise, review of systems are positive for none.   All other systems are reviewed and negative.    PHYSICAL EXAM: VS:  BP 118/82 mmHg  Pulse 66  Ht 5\' 11"  (1.803 m)  Wt 151 lb 12.8 oz (68.856 kg)  BMI 21.18 kg/m2 , BMI Body mass index is 21.18 kg/(m^2). GEN: Well nourished, well developed, in no acute distress HEENT: normal Neck: no JVD, carotid bruits, or masses Cardiac: RRR; no murmurs, rubs, or gallops,no edema  Respiratory:  clear to auscultation bilaterally, normal work of breathing GI: soft, nontender, nondistended, + BS MS: no deformity or atrophy Skin: warm and dry, no rash Neuro:  Strength and sensation are intact Psych: euthymic mood, full affect   EKG:  EKG was ordered today and showed NSR at 66bpm with no ST changes and short PR    Recent Labs: 03/16/2014: Hemoglobin 12.7*; Platelets 147* 01/27/2015: ALT 24; BUN 25*; Creatinine, Ser 1.26; Potassium 4.1; Sodium 141    Lipid Panel    Component Value Date/Time   CHOL 176 01/27/2015 0819   TRIG 51.0 01/27/2015 0819   HDL 96.20 01/27/2015 0819   CHOLHDL 2 01/27/2015 0819   VLDL 10.2 01/27/2015 0819   LDLCALC 70 01/27/2015 0819   LDLDIRECT 57 06/22/2009 1228      Wt Readings from Last 3 Encounters:  02/05/15 151 lb 12.8 oz (68.856 kg)  02/03/15 151 lb 4 oz (68.607 kg)  08/08/14 155 lb (70.308 kg)     ASSESSMENT AND PLAN:  1. ASCAD - cath showed widely patent stents.  - continue ASA/Plavix/statin 2. Dyslipidemia - LDL at goal at 70 - continue atorvastatin  3. Orthostatic hypotension - well controlled and dizziness significantly improved - he will occasionally  had a feeling of being tired but no significant dizziness or pre syncope.  He is not orthostatic on exam today. - continue Florinef/midodrine   Current medicines are reviewed at length with the patient today.  The patient does not have concerns regarding medicines.  The following changes have been made:  no change  Labs/ tests ordered today: See above Assessment and Plan No orders of  the defined types were placed in this encounter.     Disposition:   FU with me in 1 year  Signed, Sueanne Margarita, MD  02/05/2015 1:30 PM    [facility] [place], [place], [place]  [address] Phone: [phone]; Fax: [phone]

## 2015-02-05 ENCOUNTER — Encounter: Payer: Self-pay | Admitting: Cardiology

## 2015-02-05 ENCOUNTER — Ambulatory Visit (INDEPENDENT_AMBULATORY_CARE_PROVIDER_SITE_OTHER): Payer: 59 | Admitting: Cardiology

## 2015-02-05 VITALS — BP 118/82 | HR 66 | Ht 71.0 in | Wt 151.8 lb

## 2015-02-05 DIAGNOSIS — I951 Orthostatic hypotension: Secondary | ICD-10-CM

## 2015-02-05 DIAGNOSIS — E785 Hyperlipidemia, unspecified: Secondary | ICD-10-CM | POA: Diagnosis not present

## 2015-02-05 DIAGNOSIS — I2510 Atherosclerotic heart disease of native coronary artery without angina pectoris: Secondary | ICD-10-CM | POA: Diagnosis not present

## 2015-02-05 NOTE — Patient Instructions (Signed)

## 2015-02-05 NOTE — Addendum Note (Signed)
Addended by: Marchia Bond on: 02/05/2015 03:27 PM   Modules accepted: Miquel Dunn

## 2015-02-13 ENCOUNTER — Other Ambulatory Visit: Payer: Self-pay | Admitting: Family Medicine

## 2015-02-24 ENCOUNTER — Other Ambulatory Visit: Payer: Self-pay | Admitting: Cardiology

## 2015-02-25 NOTE — Telephone Encounter (Signed)
Should this be ordered for the tablet? The patients snapshot has the oral suspension listed. Please advise. Thanks, MI

## 2015-02-26 NOTE — Telephone Encounter (Signed)
Pt is calling again requesting a refill on his medication of Fludrocortisone. On last OV note on 02/05/2015 pt was taking Fludrocortisone 0.1 mg tablet daily. It was D/C and reordered as Fludrocortisone 0.1 mg/mL susp. Please advise

## 2015-02-26 NOTE — Telephone Encounter (Signed)
Pt called back and clarified the dosage and strength of the medication Fludrocortisone which is 0.1 mg tablet. I corrected the error and sent pt's refill to his pharmacy as requested. Confirmation received.

## 2015-02-27 ENCOUNTER — Other Ambulatory Visit: Payer: Self-pay | Admitting: *Deleted

## 2015-03-02 ENCOUNTER — Other Ambulatory Visit: Payer: Self-pay | Admitting: Family Medicine

## 2015-03-02 NOTE — Telephone Encounter (Signed)
Last office visit 02/03/2015.  Last refilled 02/03/2015 for #30 with no refills.  Ok to refill?

## 2015-03-03 NOTE — Telephone Encounter (Signed)
Alprazolam called into [facility] [address]

## 2015-03-23 ENCOUNTER — Other Ambulatory Visit: Payer: Self-pay | Admitting: Family Medicine

## 2015-03-30 ENCOUNTER — Other Ambulatory Visit: Payer: Self-pay | Admitting: Family Medicine

## 2015-03-30 NOTE — Telephone Encounter (Signed)
Last office visit 02/03/2015.  Last refilled 03/03/2015 for #30 with no refills.  Ok to refill?

## 2015-03-31 NOTE — Telephone Encounter (Signed)
Alprazolam called into [facility] [address]

## 2015-04-20 ENCOUNTER — Other Ambulatory Visit: Payer: Self-pay | Admitting: Family Medicine

## 2015-05-03 ENCOUNTER — Other Ambulatory Visit: Payer: Self-pay | Admitting: Family Medicine

## 2015-05-04 ENCOUNTER — Encounter: Payer: Self-pay | Admitting: Family Medicine

## 2015-05-04 NOTE — Telephone Encounter (Signed)
Alprazolam called into [facility] [address]

## 2015-05-04 NOTE — Telephone Encounter (Signed)
Last office visit 02/03/2015.  Last refilled 03/31/2015 for #30 with no refills.  Ok to refill?

## 2015-05-04 NOTE — Telephone Encounter (Signed)
Ok to ref 31, 0 ref

## 2015-05-15 ENCOUNTER — Emergency Department: Payer: 59

## 2015-05-15 ENCOUNTER — Other Ambulatory Visit: Payer: Self-pay | Admitting: Urology

## 2015-05-15 ENCOUNTER — Emergency Department
Admission: EM | Admit: 2015-05-15 | Discharge: 2015-05-15 | Disposition: A | Discharge status: Home / Self Care | Payer: 59 | Attending: Emergency Medicine | Admitting: Emergency Medicine

## 2015-05-15 DIAGNOSIS — N200 Calculus of kidney: Secondary | ICD-10-CM | POA: Diagnosis not present

## 2015-05-15 DIAGNOSIS — N028 Recurrent and persistent hematuria with other morphologic changes: Secondary | ICD-10-CM | POA: Diagnosis not present

## 2015-05-15 DIAGNOSIS — Z7902 Long term (current) use of antithrombotics/antiplatelets: Secondary | ICD-10-CM | POA: Insufficient documentation

## 2015-05-15 DIAGNOSIS — R109 Unspecified abdominal pain: Secondary | ICD-10-CM

## 2015-05-15 DIAGNOSIS — R1032 Left lower quadrant pain: Secondary | ICD-10-CM | POA: Diagnosis present

## 2015-05-15 DIAGNOSIS — Z7982 Long term (current) use of aspirin: Secondary | ICD-10-CM | POA: Diagnosis not present

## 2015-05-15 DIAGNOSIS — Z79899 Other long term (current) drug therapy: Secondary | ICD-10-CM | POA: Insufficient documentation

## 2015-05-15 LAB — URINALYSIS COMPLETE WITH MICROSCOPIC (ARMC ONLY)
Bilirubin Urine: NEGATIVE
Glucose, UA: NEGATIVE mg/dL
Ketones, ur: NEGATIVE mg/dL
Leukocytes, UA: NEGATIVE
Nitrite: NEGATIVE
Protein, ur: NEGATIVE mg/dL
Specific Gravity, Urine: 1.014 (ref 1.005–1.030)
Squamous Epithelial / HPF: NONE SEEN
WBC, UA: NONE SEEN WBC/hpf (ref 0–5)
pH: 5 (ref 5.0–8.0)

## 2015-05-15 LAB — CBC
HEMATOCRIT: 41.9 % (ref 40.0–52.0)
HEMOGLOBIN: 13.9 g/dL (ref 13.0–18.0)
MCH: 32.1 pg (ref 26.0–34.0)
MCHC: 33.1 g/dL (ref 32.0–36.0)
MCV: 97.2 fL (ref 80.0–100.0)
Platelets: 139 10*3/uL — ABNORMAL LOW (ref 150–440)
RBC: 4.31 MIL/uL — ABNORMAL LOW (ref 4.40–5.90)
RDW: 14.5 % (ref 11.5–14.5)
WBC: 6.8 10*3/uL (ref 3.8–10.6)

## 2015-05-15 LAB — BASIC METABOLIC PANEL WITH GFR
Anion gap: 12 (ref 5–15)
BUN: 25 mg/dL — ABNORMAL HIGH (ref 6–20)
CO2: 24 mmol/L (ref 22–32)
Calcium: 9.6 mg/dL (ref 8.9–10.3)
Chloride: 107 mmol/L (ref 101–111)
Creatinine, Ser: 1.22 mg/dL (ref 0.61–1.24)
GFR calc Af Amer: >60 mL/min
GFR calc non Af Amer: >60 mL/min
Glucose, Bld: 110 mg/dL — ABNORMAL HIGH (ref 65–99)
Potassium: 4.2 mmol/L (ref 3.5–5.1)
Sodium: 143 mmol/L (ref 135–145)

## 2015-05-15 MED ORDER — SODIUM CHLORIDE 0.9 % IV BOLUS (SEPSIS)
1000.0000 mL | Freq: Once | INTRAVENOUS | Status: AC
  Administered 2015-05-15: 1000 mL via INTRAVENOUS

## 2015-05-15 MED ORDER — OXYCODONE-ACETAMINOPHEN 5-325 MG PO TABS
2.0000 | ORAL_TABLET | ORAL | Status: AC
  Administered 2015-05-15: 2 via ORAL
  Filled 2015-05-15: qty 2

## 2015-05-15 MED ORDER — MORPHINE SULFATE (PF) 4 MG/ML IV SOLN
4.0000 mg | Freq: Once | INTRAVENOUS | Status: AC
  Administered 2015-05-15: 4 mg via INTRAVENOUS
  Filled 2015-05-15: qty 1

## 2015-05-15 MED ORDER — OXYCODONE-ACETAMINOPHEN 5-325 MG PO TABS: 1.0000 | ORAL_TABLET | ORAL | Status: DC | PRN

## 2015-05-15 MED ORDER — ONDANSETRON HCL 4 MG PO TABS: ORAL_TABLET | ORAL | Status: DC

## 2015-05-15 MED ORDER — TAMSULOSIN HCL 0.4 MG PO CAPS: ORAL_CAPSULE | ORAL | Status: DC

## 2015-05-15 MED ORDER — DOCUSATE SODIUM 100 MG PO CAPS: ORAL_CAPSULE | ORAL | Status: DC

## 2015-05-15 MED ORDER — ONDANSETRON HCL 4 MG/2ML IJ SOLN
4.0000 mg | Freq: Once | INTRAMUSCULAR | Status: AC
  Administered 2015-05-15: 4 mg via INTRAVENOUS
  Filled 2015-05-15: qty 2

## 2015-05-15 NOTE — ED Provider Notes (Signed)
[facility] Emergency Department Provider Note  ____________________________________________  Time seen: Approximately 3:50 AM  I have reviewed the triage vital signs and the nursing notes.   HISTORY  Chief Complaint Abdominal Pain    HPI Shawn Lam is a 65 y.o. male with PMH that includes IgA nephropathy as well as kidney stones which required retrieval.  He presents tonight with acute onset of severe left flank pain and left lower quadrant pain that is radiating down into the left side of his groin.  It is worse with movement and relieved somewhat by rest.  He describes it as severe, sharp, and stabbing.  It is accompanied by nausea but no vomiting.  He denies fever/chills, chest pain, shortness of breath, dysuria.  He cannot be sure about whether it feels the same as his prior kidney stone but it is similar.  He has not been having any difficulty with urination.  He has chronic hematuria from the history of IgA nephropathy and is followed by a urologist.   Past Medical History  Diagnosis Date  . Anxiety   . Urticaria   . Rosacea   . Insomnia   . Hematuria   . CAD (coronary artery disease) 06/2005    PCI of LAD and RCA  . Orthostatic hypotension     negative tilt table  . Hyperlipidemia   . Asymptomatic LV dysfunction     mild with EF 48%  . Renal disorder     PT IS SEEING DR. Lorrene Reid- NEPHROLOGIST  . Berger's disease   . Left ureteral stone     Patient Active Problem List   Diagnosis Date Noted  . Elevated LFTs 02/27/2014  . Snoring 02/05/2014  . OSA (obstructive sleep apnea) 02/05/2014  . Orthostatic hypotension 02/07/2013  . Microcytic anemia 01/31/2013  . Scotoma involving central area 01/29/2013  . HEMATURIA UNSPECIFIED 01/21/2010  . Prediabetes 06/24/2008  . Hyperlipidemia 07/05/2006  . Generalized anxiety disorder 07/05/2006  . Coronary atherosclerosis 07/05/2006  . GERD 07/05/2006    Past Surgical History  Procedure  Laterality Date  . Left knee cap  surgery about 40 yrs ago  ? 1970    . Cardiac catheterization  09/06/2013    [facility]  . Cardiac catheterization    . Coronary angioplasty  2004    STENT PLACEMENT  . Left heart catheterization with coronary angiogram N/A 09/06/2013    Procedure: LEFT HEART CATHETERIZATION WITH CORONARY ANGIOGRAM;  Surgeon: Sinclair Grooms, MD;  Location: [facility] CATH LAB;  Service: Cardiovascular;  Laterality: N/A;  . Cystoscopy with retrograde pyelogram, ureteroscopy and stent placement Left 03/19/2014    Procedure: [place], URETEROSCOPY AND STENT PLACEMENT;  Surgeon: Junious Dresser, MD;  Location: [facility] ORS;  Service: Urology;  Laterality: Left;  . Stone extraction with basket Left 03/19/2014    Procedure: STONE EXTRACTION WITH BASKET;  Surgeon: Junious Dresser, MD;  Location: [facility] ORS;  Service: Urology;  Laterality: Left;    Current Outpatient Rx  Name  Route  Sig  Dispense  Refill  . ALPRAZolam (XANAX) 0.5 MG tablet      TAKE 1 TABLET ONCE A DAY AS NEEDED   30 tablet   0     Not to exceed 5 additional fills before 09/27/2015   . aspirin 81 MG tablet   Oral   Take 81 mg by mouth daily.         [name] atorvastatin (LIPITOR) 40 MG tablet  TAKE 1 TABLET BY MOUTH DAILY.   90 tablet   2   . clopidogrel (PLAVIX) 75 MG tablet   Oral   Take 1 tablet (75 mg total) by mouth daily.   90 tablet   3   .           . ferrous sulfate 325 (65 FE) MG tablet      TAKE 1 TABLET BY MOUTH 3 TIMES DAILY WITH MEALS.   270 tablet   3   . fludrocortisone (FLORINEF) 0.1 MG tablet      TAKE 1 TABLET (0.1 MG TOTAL) BY MOUTH DAILY.   90 tablet   3   . FLUOCINOLONE ACETONIDE BODY 0.01 % OIL   Topical   Apply 1 application topically daily as needed (for dry skin).          . hydrOXYzine (ATARAX/VISTARIL) 25 MG tablet   Oral   Take 50 mg by mouth at bedtime.          . midodrine (PROAMATINE) 5 MG tablet      TAKE 1 TABLET (5 MG TOTAL) BY  MOUTH 2 (TWO) TIMES DAILY WITH BREAKFAST AND LUNCH.   180 tablet   6   . Multiple Vitamins-Minerals (CENTRUM ADULTS PO)   Oral   Take 1 tablet by mouth every morning.          . nitroGLYCERIN (NITROSTAT) 0.4 MG SL tablet   Sublingual   Place 1 tablet (0.4 mg total) under the tongue every 5 (five) minutes as needed for chest pain.   25 tablet   5   . omega-3 acid ethyl esters (LOVAZA) 1 G capsule   Oral   Take 1 g by mouth 2 (two) times daily.          .           .           . RESTASIS 0.05 % ophthalmic emulsion            6     Dispense as written.   . sertraline (ZOLOFT) 100 MG tablet      TAKE 2 TABLETS (200 MG TOTAL) BY MOUTH DAILY.   180 tablet   1   .           . vitamin B-12 (CYANOCOBALAMIN) 1000 MCG tablet   Oral   Take 1,000 mcg by mouth at bedtime.            Allergies Codeine and Tape  Family History  Problem Relation Age of Onset  . Coronary artery disease Mother   . Heart attack Mother   . Heart disease Mother   . Hypertension Mother   . Cancer Father     lung and colon  . Diabetes Brother   . Lung cancer Father     smoker    Social History Social History  Substance Use Topics  . Smoking status: Never Smoker   . Smokeless tobacco: Never Used  . Alcohol Use: 2.4 oz/week    4 Cans of beer per week    Review of Systems Constitutional: No fever/chills Eyes: No visual changes. ENT: No sore throat. Cardiovascular: Denies chest pain. Respiratory: Denies shortness of breath. Gastrointestinal: Left sided flank pain and left lower quadrant abdominal pain radiating to the left groin.  Nausea with no vomiting.  No diarrhea or constipation. Genitourinary: Negative for dysuria.  Chronic hematuria.  Left lower quadrant abdominal pain radiates into the left groin.  No testicular pain. Musculoskeletal: Left Sided flank pain Skin: Negative for rash. Neurological: Negative for headaches, focal weakness or numbness.  10-point ROS otherwise  negative.  ____________________________________________   PHYSICAL EXAM:  VITAL SIGNS: ED Triage Vitals  Enc Vitals Group     BP 05/15/15 0247 146/62 mmHg     Pulse Rate 05/15/15 0247 57     Resp 05/15/15 0247 18     Temp --      Temp src --      SpO2 05/15/15 0247 100 %     Weight 05/15/15 0247 154 lb (69.854 kg)     Height 05/15/15 0247 6' (1.829 m)     Head Cir --      Peak Flow --      Pain Score 05/15/15 0247 10     Pain Loc --      Pain Edu? --      Excl. in [place]? --     Constitutional: Alert and oriented. Well appearing and in no acute distress. Eyes: Conjunctivae are normal. PERRL. EOMI. Head: Atraumatic. Nose: No congestion/rhinnorhea. Mouth/Throat: Mucous membranes are moist.  Oropharynx non-erythematous. Neck: No stridor.   Cardiovascular: Normal rate, regular rhythm. Grossly normal heart sounds.  Good peripheral circulation. Respiratory: Normal respiratory effort.  No retractions. Lungs CTAB. Gastrointestinal: Moderate left lower quadrant abdominal tenderness to palpation and left inguinal tenderness without any evidence of an inguinal hernia.  No rebound or guarding.  Mild left-sided CVA tenderness. Genitourinary: No testicular tenderness or epididymal tenderness.  Musculoskeletal: No lower extremity tenderness nor edema.  No joint effusions. Neurologic:  Normal speech and language. No gross focal neurologic deficits are appreciated.  Skin:  Skin is warm, dry and intact. No rash noted. Psychiatric: Mood and affect are normal. Speech and behavior are normal.  ____________________________________________   LABS (all labs ordered are listed, but only abnormal results are displayed)  Labs Reviewed  CBC - Abnormal; Notable for the following:    RBC 4.31 (*)    Platelets 139 (*)    All other components within normal limits  BASIC METABOLIC PANEL - Abnormal; Notable for the following:    Glucose, Bld 110 (*)    BUN 25 (*)    All other components within normal  limits  URINALYSIS COMPLETEWITH MICROSCOPIC ([facility] ONLY) - Abnormal; Notable for the following:    Color, Urine YELLOW (*)    APPearance HAZY (*)    Hgb urine dipstick 3+ (*)    Bacteria, UA RARE (*)    All other components within normal limits   ____________________________________________  EKG  None ____________________________________________  RADIOLOGY   Ct Renal Stone Study  05/15/2015  CLINICAL DATA:  Woke up at 10 p.m. with LEFT lower quadrant pain and nausea. Similar symptoms associated with kidney stones. History of LEFT ureteral stone. EXAM: CT ABDOMEN AND PELVIS WITHOUT CONTRAST TECHNIQUE: Multidetector CT imaging of the abdomen and pelvis was performed following the standard protocol without IV contrast. COMPARISON:  CT abdomen and pelvis March 16, 2014 FINDINGS: LUNG BASES: Included view of the lung bases are clear. The visualized heart and pericardium are unremarkable. KIDNEYS/BLADDER: Kidneys are orthotopic, demonstrating normal size and morphology. Mild LEFT hydroureteronephrosis to the level of the distal ureter where a 5 mm calculus is present, consistent with interval migration of previously identified calculus. 2 mm dependent calculus LEFT upper ureter. In addition, 2 mm calculus at LEFT ureterovesicular junction. 3 mm calculus at RIGHT ureterovesicular junction without obstructive uropathy. Multiple nephrolithiasis measuring up to  4 mm in LEFT lower pole. Punctate RIGHT nephrolithiasis. Limited assessment for renal masses on this nonenhanced examination. The unopacified ureters are normal in course and caliber. Urinary bladder is partially distended and unremarkable. SOLID ORGANS: The liver, spleen, gallbladder, pancreas and adrenal glands are unremarkable for this non-contrast examination. GASTROINTESTINAL TRACT: The stomach, small and large bowel are normal in course and caliber without inflammatory changes, the sensitivity may be decreased by lack of enteric contrast.  Mildly thickened rectum can be seen with internal hemorrhoid. The appendix is not discretely identified, however there are no inflammatory changes in the right lower quadrant. PERITONEUM/RETROPERITONEUM: Aortoiliac vessels are normal in course and caliber, mild calcific atherosclerosis. No lymphadenopathy by CT size criteria. Similar prostatomegaly. Small amount of free fluid in the pelvis. SOFT TISSUES/ OSSEOUS STRUCTURES: Nonsuspicious. Moderate L4-5 degenerative disc. IMPRESSION: 5 mm distal LEFT ureter calculus, consistent with interval migration resulting in mild obstructive uropathy. 2 mm LEFT UVJ calculus, 3 mm RIGHT UVJ calculus without RIGHT obstructive uropathy. Residual bilateral nephrolithiasis measuring up to 4 mm . Small amount of nonspecific free fluid in the pelvis. Electronically Signed   By: Elon Alas M.D.   On: 05/15/2015 05:00    ____________________________________________   PROCEDURES  Procedure(s) performed: None  Critical Care performed: No ____________________________________________   INITIAL IMPRESSION / ASSESSMENT AND PLAN / ED COURSE  Pertinent labs & imaging results that were available during my care of the patient were reviewed by me and considered in my medical decision making (see chart for details).  Patient's pain was well-controlled after 2 doses of morphine in the emergency department.  His noncontrast CT of the abdomen and pelvis revealed 3 ureteral stones, one of which was 5 mm.  However he has only mild obstructive changes, his lab work is reassuring, his pain is well controlled.  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  We discussed pain control and close outpatient follow-up and he is comfortable with this plan.  I encouraged him to call immediately first thing this morning to his urologist and explained the results of his workup tonight and ask for the next available clinic follow-up appointment.  I explained that particularly with his  history of IgA nephropathy it is important that he stay on top of it in case he needs an intervention with the 5 mm stone.  He and his wife both understand and agree with the plan.  His urine has no white blood cells, nitrite negative, and no leukocyte esterase, so I did not start him on antibiotics.  ____________________________________________  FINAL CLINICAL IMPRESSION(S) / ED DIAGNOSES  Final diagnoses:  Benign hematuria due to IgA nephropathy  Left groin pain  Abdominal pain, acute, left lower quadrant  Acute left flank pain  Bilateral kidney stones      NEW MEDICATIONS STARTED DURING THIS VISIT:  Discharge Medication List as of 05/15/2015  6:06 AM    START taking these medications   Details  docusate sodium (COLACE) 100 MG capsule Take 1 tablet once or twice daily as needed for constipation while taking narcotic pain medicine, Print    ondansetron (ZOFRAN) 4 MG tablet Take 1-2 tabs by mouth every 8 hours as needed for nausea/vomiting, Print    oxyCODONE-acetaminophen (ROXICET) 5-325 MG tablet Take 1-2 tablets by mouth every 4 (four) hours as needed for severe pain., Starting 05/15/2015, Until Discontinued, Print    tamsulosin (FLOMAX) 0.4 MG CAPS capsule Take 1 tablet by mouth daily until you pass the kidney stone or no longer  have symptoms, Print         Hinda Kehr, MD 05/15/15 [phone]

## 2015-05-15 NOTE — Discharge Instructions (Signed)
You have been seen in the Emergency Department (ED) today for pain that we believe is caused by kidney stones.  Your CT scan revealed that you have three kidney stones in your ureters, ranging in size from 75mm to a left-sided UVJ stone measuring 98mm.  Fortunately your kidney function is normal and you have only mild obstructive changes in the left kidney.  You will likely be able to pass the stones on your own, but we recommend that you call your urologist this morning.  Explain you were seen in the ED and that you have three ureteral stones and would like to see Dr. Tammi Klippel to follow up as soon as possible.  As we have discussed, please drink plenty of fluids.  Please make a follow up appointment with the physician(s) listed elsewhere in this documentation.  You may take pain medication as needed but ONLY as prescribed.  Please also take your prescribed Flomax daily.  We also recommend that you take over-the-counter ibuprofen regularly according to label instructions over the next 5 days.  Take it with meals to minimize stomach discomfort.  Please see your doctor as soon as possible as stones may take 1-3 weeks to pass and you may require additional care or medications.  Do not drink alcohol, drive or participate in any other potentially dangerous activities while taking opiate pain medication as it may make you sleepy. Do not take this medication with any other sedating medications, either prescription or over-the-counter. If you were prescribed Percocet or Vicodin, do not take these with acetaminophen (Tylenol) as it is already contained within these medications.   Take Percocet as needed for severe pain.  This medication is an opiate (or narcotic) pain medication and can be habit forming.  Use it as little as possible to achieve adequate pain control.  Do not use or use it with extreme caution if you have a history of opiate abuse or dependence.  If you are on a pain contract with your primary care  doctor or a pain specialist, be sure to let them know you were prescribed this medication today from the [facility] Emergency Department.  This medication is intended for your use only - do not give any to anyone else and keep it in a secure place where nobody else, especially children, have access to it.  It will also cause or worsen constipation, so you may want to consider taking an over-the-counter stool softener while you are taking this medication.  Return to the Emergency Department (ED) or call your doctor if you have any worsening pain, fever, painful urination, are unable to urinate, or develop other symptoms that concern you.    Kidney Stones Kidney stones (urolithiasis) are deposits that form inside your kidneys. The intense pain is caused by the stone moving through the urinary tract. When the stone moves, the ureter goes into spasm around the stone. The stone is usually passed in the urine.  CAUSES   A disorder that makes certain neck glands produce too much parathyroid hormone (primary hyperparathyroidism).  A buildup of uric acid crystals, similar to gout in your joints.  Narrowing (stricture) of the ureter.  A kidney obstruction present at birth (congenital obstruction).  Previous surgery on the kidney or ureters.  Numerous kidney infections. SYMPTOMS   Feeling sick to your stomach (nauseous).  Throwing up (vomiting).  Blood in the urine (hematuria).  Pain that usually spreads (radiates) to the groin.  Frequency or urgency of urination. DIAGNOSIS   Taking  a history and physical exam.  Blood or urine tests.  CT scan.  Occasionally, an examination of the inside of the urinary bladder (cystoscopy) is performed. TREATMENT   Observation.  Increasing your fluid intake.  Extracorporeal shock wave lithotripsy--This is a noninvasive procedure that uses shock waves to break up kidney stones.  Surgery may be needed if you have severe pain or persistent  obstruction. There are various surgical procedures. Most of the procedures are performed with the use of small instruments. Only small incisions are needed to accommodate these instruments, so recovery time is minimized. The size, location, and chemical composition are all important variables that will determine the proper choice of action for you. Talk to your health care provider to better understand your situation so that you will minimize the risk of injury to yourself and your kidney.  HOME CARE INSTRUCTIONS   Drink enough water and fluids to keep your urine clear or pale yellow. This will help you to pass the stone or stone fragments.  Strain all urine through the provided strainer. Keep all particulate matter and stones for your health care provider to see. The stone causing the pain may be as small as a grain of salt. It is very important to use the strainer each and every time you pass your urine. The collection of your stone will allow your health care provider to analyze it and verify that a stone has actually passed. The stone analysis will often identify what you can do to reduce the incidence of recurrences.  Only take over-the-counter or prescription medicines for pain, discomfort, or fever as directed by your health care provider.  Keep all follow-up visits as told by your health care provider. This is important.  Get follow-up X-rays if required. The absence of pain does not always mean that the stone has passed. It may have only stopped moving. If the urine remains completely obstructed, it can cause loss of kidney function or even complete destruction of the kidney. It is your responsibility to make sure X-rays and follow-ups are completed. Ultrasounds of the kidney can show blockages and the status of the kidney. Ultrasounds are not associated with any radiation and can be performed easily in a matter of minutes.  Make changes to your daily diet as told by your health care provider.  You may be told to:  Limit the amount of salt that you eat.  Eat 5 or more servings of fruits and vegetables each day.  Limit the amount of meat, poultry, fish, and eggs that you eat.  Collect a 24-hour urine sample as told by your health care provider.You may need to collect another urine sample every 6-12 months. SEEK MEDICAL CARE IF:  You experience pain that is progressive and unresponsive to any pain medicine you have been prescribed. SEEK IMMEDIATE MEDICAL CARE IF:   Pain cannot be controlled with the prescribed medicine.  You have a fever or shaking chills.  The severity or intensity of pain increases over 18 hours and is not relieved by pain medicine.  You develop a new onset of abdominal pain.  You feel faint or pass out.  You are unable to urinate.   This information is not intended to replace advice given to you by your health care provider. Make sure you discuss any questions you have with your health care provider.   Document Released: 03/28/2005 Document Revised: 12/17/2014 Document Reviewed: 08/29/2012 Elsevier Interactive Patient Education [employer].

## 2015-05-15 NOTE — ED Notes (Addendum)
Patient ambulatory to triage with steady gait, without difficulty; appears uncomfortable, restless; Pt reports awakening at 10pm with left lower abd pain accomp by nausea; st hx of same with kidney stone

## 2015-05-15 NOTE — ED Notes (Signed)
Pt reports relief of nausea and pain decreased to 6/10

## 2015-05-18 ENCOUNTER — Encounter (HOSPITAL_COMMUNITY): Payer: Self-pay | Admitting: *Deleted

## 2015-05-20 ENCOUNTER — Encounter (HOSPITAL_COMMUNITY)
Admission: RE | Disposition: A | Discharge status: Home / Self Care | Payer: Self-pay | Source: Ambulatory Visit | Attending: Urology

## 2015-05-20 ENCOUNTER — Encounter (HOSPITAL_COMMUNITY): Payer: Self-pay | Admitting: *Deleted

## 2015-05-20 ENCOUNTER — Ambulatory Visit (HOSPITAL_COMMUNITY)
Admission: RE | Admit: 2015-05-20 | Discharge: 2015-05-20 | Disposition: A | Discharge status: Home / Self Care | Payer: 59 | Source: Ambulatory Visit | Attending: Urology | Admitting: Urology

## 2015-05-20 ENCOUNTER — Ambulatory Visit (HOSPITAL_COMMUNITY): Payer: 59 | Admitting: Anesthesiology

## 2015-05-20 DIAGNOSIS — E785 Hyperlipidemia, unspecified: Secondary | ICD-10-CM | POA: Diagnosis not present

## 2015-05-20 DIAGNOSIS — Z7982 Long term (current) use of aspirin: Secondary | ICD-10-CM | POA: Diagnosis not present

## 2015-05-20 DIAGNOSIS — F329 Major depressive disorder, single episode, unspecified: Secondary | ICD-10-CM | POA: Diagnosis not present

## 2015-05-20 DIAGNOSIS — I2510 Atherosclerotic heart disease of native coronary artery without angina pectoris: Secondary | ICD-10-CM | POA: Insufficient documentation

## 2015-05-20 DIAGNOSIS — Z79899 Other long term (current) drug therapy: Secondary | ICD-10-CM | POA: Diagnosis not present

## 2015-05-20 DIAGNOSIS — Z7902 Long term (current) use of antithrombotics/antiplatelets: Secondary | ICD-10-CM | POA: Insufficient documentation

## 2015-05-20 DIAGNOSIS — D649 Anemia, unspecified: Secondary | ICD-10-CM | POA: Insufficient documentation

## 2015-05-20 DIAGNOSIS — N028 Recurrent and persistent hematuria with other morphologic changes: Secondary | ICD-10-CM | POA: Diagnosis not present

## 2015-05-20 DIAGNOSIS — F419 Anxiety disorder, unspecified: Secondary | ICD-10-CM | POA: Insufficient documentation

## 2015-05-20 DIAGNOSIS — N202 Calculus of kidney with calculus of ureter: Secondary | ICD-10-CM | POA: Insufficient documentation

## 2015-05-20 DIAGNOSIS — Z955 Presence of coronary angioplasty implant and graft: Secondary | ICD-10-CM | POA: Diagnosis not present

## 2015-05-20 HISTORY — PX: CYSTOSCOPY WITH RETROGRADE PYELOGRAM, URETEROSCOPY AND STENT PLACEMENT: SHX5789

## 2015-05-20 HISTORY — DX: Depression, unspecified: F32.A

## 2015-05-20 HISTORY — DX: Major depressive disorder, single episode, unspecified: F32.9

## 2015-05-20 HISTORY — PX: HOLMIUM LASER APPLICATION: SHX5852

## 2015-05-20 HISTORY — DX: Anemia, unspecified: D64.9

## 2015-05-20 SURGERY — CYSTOURETEROSCOPY, WITH RETROGRADE PYELOGRAM AND STENT INSERTION
Anesthesia: General | Laterality: Bilateral

## 2015-05-20 MED ORDER — FENTANYL CITRATE (PF) 100 MCG/2ML IJ SOLN
INTRAMUSCULAR | Status: AC
  Filled 2015-05-20: qty 2

## 2015-05-20 MED ORDER — MIDAZOLAM HCL 2 MG/2ML IJ SOLN
INTRAMUSCULAR | Status: AC
Start: 2015-05-20 — End: 2015-05-20
  Filled 2015-05-20: qty 2

## 2015-05-20 MED ORDER — ONDANSETRON HCL 4 MG/2ML IJ SOLN
INTRAMUSCULAR | Status: AC
  Filled 2015-05-20: qty 2

## 2015-05-20 MED ORDER — EPHEDRINE SULFATE 50 MG/ML IJ SOLN
INTRAMUSCULAR | Status: AC
  Filled 2015-05-20: qty 1

## 2015-05-20 MED ORDER — PROPOFOL 10 MG/ML IV BOLUS
INTRAVENOUS | Status: AC
  Filled 2015-05-20: qty 20

## 2015-05-20 MED ORDER — OXYCODONE-ACETAMINOPHEN 5-325 MG PO TABS: 1.0000 | ORAL_TABLET | ORAL | Status: DC | PRN

## 2015-05-20 MED ORDER — CEFTRIAXONE SODIUM 2 G IJ SOLR
2.0000 g | INTRAMUSCULAR | Status: AC
  Administered 2015-05-20: 2 g via INTRAVENOUS

## 2015-05-20 MED ORDER — SULFAMETHOXAZOLE-TRIMETHOPRIM 800-160 MG PO TABS: 1.0000 | ORAL_TABLET | Freq: Two times a day (BID) | ORAL | Status: DC

## 2015-05-20 MED ORDER — MIDAZOLAM HCL 5 MG/5ML IJ SOLN
INTRAMUSCULAR | Status: DC | PRN
  Administered 2015-05-20: 2 mg via INTRAVENOUS

## 2015-05-20 MED ORDER — DEXTROSE 5 % IV SOLN
INTRAVENOUS | Status: AC
  Filled 2015-05-20: qty 2

## 2015-05-20 MED ORDER — OXYCODONE-ACETAMINOPHEN 5-325 MG PO TABS
1.0000 | ORAL_TABLET | ORAL | Status: DC | PRN
  Administered 2015-05-20 (×2): 1 via ORAL
  Filled 2015-05-20 (×2): qty 1

## 2015-05-20 MED ORDER — LACTATED RINGERS IV SOLN: INTRAVENOUS | Status: DC

## 2015-05-20 MED ORDER — IOHEXOL 300 MG/ML  SOLN
INTRAMUSCULAR | Status: DC | PRN
  Administered 2015-05-20: 21 mL via URETHRAL

## 2015-05-20 MED ORDER — EPHEDRINE SULFATE 50 MG/ML IJ SOLN
INTRAMUSCULAR | Status: DC | PRN
  Administered 2015-05-20 (×2): 5 mg via INTRAVENOUS

## 2015-05-20 MED ORDER — PROPOFOL 10 MG/ML IV BOLUS
INTRAVENOUS | Status: DC | PRN
  Administered 2015-05-20: 200 mg via INTRAVENOUS

## 2015-05-20 MED ORDER — PROMETHAZINE HCL 25 MG/ML IJ SOLN
6.2500 mg | INTRAMUSCULAR | Status: DC | PRN
Start: 2015-05-20 — End: 2015-05-20

## 2015-05-20 MED ORDER — SODIUM CHLORIDE 0.9 % IR SOLN
Status: DC | PRN
  Administered 2015-05-20: 2000 mL

## 2015-05-20 MED ORDER — LACTATED RINGERS IV SOLN
INTRAVENOUS | Status: DC | PRN
  Administered 2015-05-20: 08:00:00 via INTRAVENOUS

## 2015-05-20 MED ORDER — FENTANYL CITRATE (PF) 100 MCG/2ML IJ SOLN
25.0000 ug | INTRAMUSCULAR | Status: DC | PRN
  Administered 2015-05-20 (×2): 50 ug via INTRAVENOUS

## 2015-05-20 MED ORDER — SENNOSIDES-DOCUSATE SODIUM 8.6-50 MG PO TABS: 1.0000 | ORAL_TABLET | Freq: Two times a day (BID) | ORAL | Status: DC

## 2015-05-20 MED ORDER — SODIUM CHLORIDE 0.9 % IJ SOLN
INTRAMUSCULAR | Status: AC
  Filled 2015-05-20: qty 10

## 2015-05-20 MED ORDER — FENTANYL CITRATE (PF) 100 MCG/2ML IJ SOLN
INTRAMUSCULAR | Status: DC | PRN
  Administered 2015-05-20 (×4): 25 ug via INTRAVENOUS

## 2015-05-20 MED ORDER — MEPERIDINE HCL 50 MG/ML IJ SOLN: 6.2500 mg | INTRAMUSCULAR | Status: DC | PRN

## 2015-05-20 MED ORDER — ONDANSETRON HCL 4 MG/2ML IJ SOLN
INTRAMUSCULAR | Status: DC | PRN
  Administered 2015-05-20: 4 mg via INTRAVENOUS

## 2015-05-20 SURGICAL SUPPLY — 24 items
BASKET DAKOTA 1.9FR 11X120 (BASKET) ×3 IMPLANT
BASKET LASER NITINOL 1.9FR (BASKET) IMPLANT
BASKET STNLS GEMINI 4WIRE 3FR (BASKET) IMPLANT
BASKET ZERO TIP NITINOL 2.4FR (BASKET) IMPLANT
CATH INTERMIT  6FR 70CM (CATHETERS) ×3 IMPLANT
CLOTH BEACON ORANGE TIMEOUT ST (SAFETY) ×3 IMPLANT
ELECT REM PT RETURN 9FT ADLT (ELECTROSURGICAL)
ELECTRODE REM PT RTRN 9FT ADLT (ELECTROSURGICAL) IMPLANT
FIBER LASER FLEXIVA 1000 (UROLOGICAL SUPPLIES) IMPLANT
FIBER LASER FLEXIVA 200 (UROLOGICAL SUPPLIES) ×3 IMPLANT
FIBER LASER FLEXIVA 365 (UROLOGICAL SUPPLIES) IMPLANT
FIBER LASER FLEXIVA 550 (UROLOGICAL SUPPLIES) IMPLANT
FIBER LASER TRAC TIP (UROLOGICAL SUPPLIES) IMPLANT
GLOVE BIOGEL M STRL SZ7.5 (GLOVE) ×3 IMPLANT
GOWN STRL REUS W/TWL XL LVL3 (GOWN DISPOSABLE) ×6 IMPLANT
GUIDEWIRE ANG ZIPWIRE 038X150 (WIRE) ×6 IMPLANT
GUIDEWIRE STR DUAL SENSOR (WIRE) ×3 IMPLANT
IV NS IRRIG 3000ML ARTHROMATIC (IV SOLUTION) ×3 IMPLANT
PACK CYSTO (CUSTOM PROCEDURE TRAY) ×3 IMPLANT
SHEATH ACCESS URETERAL 38CM (SHEATH) ×3 IMPLANT
STENT POLARIS 5FRX26 (STENTS) ×6 IMPLANT
SYRINGE 10CC LL (SYRINGE) ×3 IMPLANT
SYRINGE IRR TOOMEY STRL 70CC (SYRINGE) IMPLANT
TUBE FEEDING 8FR 16IN STR KANG (MISCELLANEOUS) ×3 IMPLANT

## 2015-05-20 NOTE — Brief Op Note (Signed)
05/20/2015  9:22 AM  PATIENT:  Shawn Lam  65 y.o. male  PRE-OPERATIVE DIAGNOSIS:  RIGHT URETERAL STONE  POST-OPERATIVE DIAGNOSIS:  RIGHT URETERAL STONE  PROCEDURE:  Procedure(s):  CYSTOSCOPY WITH BILATERAL RETROGRADE PYELOGRAM,RIGHT  DIAGNOSTIC URETEROSCOPY ,LEFT URETEROSCOPY WITH HOLMIUM LASER  AND BILATERAL  STENT PLACEMENT  (Bilateral) HOLMIUM LASER APPLICATION (Bilateral)  SURGEON:  Surgeon(s) and Role:    * Alexis Frock, MD - Primary  PHYSICIAN ASSISTANT:   ASSISTANTS: none   ANESTHESIA:   general  EBL:     BLOOD ADMINISTERED:none  DRAINS: none   LOCAL MEDICATIONS USED:  NONE  SPECIMEN:  Source of Specimen:  bilateral ureteral + renal stones  DISPOSITION OF SPECIMEN:  [facility] for compositional analysis  COUNTS:  YES  TOURNIQUET:  * No tourniquets in log *  DICTATION: .Other Dictation: Dictation Number [id number]  PLAN OF CARE: Discharge to home after PACU  PATIENT DISPOSITION:  PACU - hemodynamically stable.   Delay start of Pharmacological VTE agent (>24hrs) due to surgical blood loss or risk of bleeding: yes

## 2015-05-20 NOTE — Op Note (Signed)
NAME:  Shawn Lam, Shawn Lam NO.:  [id number]  MEDICAL RECORD NO.:  [id number]  LOCATION:  [facility]                         FACILITY:  [facility]  PHYSICIAN:  Alexis Frock, MD     DATE OF BIRTH:  May 20, 1950  DATE OF PROCEDURE: 05/20/2015                              OPERATIVE REPORT   DIAGNOSES:  Bilateral ureteral left renal stones, history of recurrent renal colic.  PROCEDURES: 1. Cystoscopy with bilateral retrograde pyelogram and interpretation. 2. Right diagnostic ureteroscopy. 3. Insertion of right ureteral stent, 5 x 26 Polaris with tether. 4. Left ureteroscopy with laser lithotripsy. 5. Insertion of left ureteral stent, 5 x 26 Polaris with tether.  ESTIMATED BLOOD LOSS:  Nil.  COMPLICATION:  None.  SPECIMENS:  Bilateral ureteral and renal stone fragments for compositional analysis.  FINDINGS: 1. Likely interval passage of the bilateral ureteral stones as     corroborated by no filling defects on retrograde pyelography or     ureteroscopy and several small stone fragments at the urinary     bladder. 2. Multifocal left intrarenal stones, all stone fragments larger than     1/3rd mm, retrieved. 3. Successful placement of bilateral ureteral stents, proximal in the     renal pelvis and distal in the urinary bladder.  INDICATION:  Shawn Lam is a very pleasant 65 year old gentleman with history of recurrent nephrolithiasis.  He was found on workup of colicky left flank pain, by the ER imaging to have bilateral ureteral stones with a tiny right ureterovesical junction stone, multifocal left ureteral stones and left renal stones.  He had no right hydronephrosis whatsoever and his kidney function was normal.  He underwent a brief trial of medical therapy.  He failed to pass the stones and I discussed with him by phone the semi-urgent nature of his bilateral stones and recommended treatment of bilateral ureteroscopy with goal of stone free and to verify no bilateral  obstruction with strong warnings to contact us if he became aneuric or progressed in symptoms.  An informed consent was obtained and placed in the medical record.  PROCEDURE IN DETAIL:  The patient being Shawn Lam, was verified. Procedure being bilateral ureteroscopic stone manipulation was confirmed.  Procedure was carried out.  Time-out was performed. Intravenous antibiotics were administered.  General LMA anesthesia was introduced.  The patient was placed into a low lithotomy position. Sterile field was created by prepping and draping the patient's penis, perineum, and proximal thighs using iodine x3.  Next, cystourethroscopy was performed using a 23-French rigid cystoscope with a 30-degree offset lens.  Inspection of anterior and posterior urethra was unremarkable. Inspection of the bladder revealed multifocal small volume stones in the bladder, these were felt to likely represent interval passage of some of his upper tract stones, these were flushed and set aside for compositional analysis.  As the goal today was to verify stone free status, we elected to proceed with bilateral ureteroscopy.  First, the right ureteral orifice was cannulated with a 6-French end-hole catheter and right retrograde pyelogram was obtained.  Right retrograde pyelogram demonstrated a single right ureter with single-system right kidney.  No fillings defects or hydroureteronephrosis noted whatsoever.  A 0.038 Zip wire  was advanced at the level of the upper pole and set aside as a safety wire. Similarly, left retrograde pyelogram was obtained.  Left retrograde pyelogram demonstrated a single left ureter with single system left kidney.  There was very mild hydroureteronephrosis noted without any obvious filling defects.  A 0.038 Zip wire was advanced at the level of the upper pole and set aside as a safety wire.  An 8-French feeding tube was placed in the urinary bladder for pressure release. Next,  semi-rigid ureteroscopy was performed in the entire length of right ureter alongside a separate Sensor working wire.  No mucosal abnormalities or calcifications were noted whatsoever.  As his preoperative imaging had revealed no right intrarenal stones, it was felt that this overall picture represented interval passage of his right distal stone.  Attention was then directed at the left side.  The left ureter was inspected throughout its entire length also using the semi- rigid ureteroscope alongside a separate Sensor working wire.  There were two areas of mild edema and mucosal swelling noted likely representing prior sites of ureteral stone impaction, but without stones noted whatsoever.  Given he has known left intrarenal stone as the goal today is stone free, the semi-rigid ureteroscope was exchanged for a 12/14, 38- cm ureteral access sheath using continuous fluoroscopic vision to the level of the proximal ureter.  Next, systematic inspection was performed of the left kidney including all calices x2 with a flexible digital ureteroscope.  This revealed multifocal small volume mostly papillary tip calcifications, dominant of which was in the lower pole.  There was one submucosal stone in the midpole, most of these appeared to be amenable to simple basketing.  As such, a Dakota-type basket was used to grasp these stone on their long axis and they were removed and set aside for compositional analysis.  The submucosal midpole stone was retrieved by first performing laser lithotripsy at the papillary mucosa, which allowed the stone to become intraluminal, it was then grasped and brought out in its entirety.  Following these maneuvers, there was excellent hemostasis.  There was complete resolution of all stone fragments larger than 1/3rd mm with systematic inspection of the left kidney again including all calices.  The access sheath was removed under continuous ureteroscopic vision.  No  mucosal abnormalities were found. Given the bilateral nature of the procedure today, it was felt that interval stenting would be warranted.  As such, a 5 x 26 Polaris-type stent was placed over the remaining safety wire on each side using fluoroscopic guidance.  Good proximal and distal deployment were noted. Tether was left in place and fashioned to the dorsum of the penis and procedure was terminated.  The patient tolerated the procedure well. There were no immediate periprocedural complications.  The patient was taken to the postanesthesia care unit in stable condition.          ______________________________ Alexis Frock, MD     TM/MEDQ  D:  05/20/2015  T:  05/20/2015  Job:  [id number]

## 2015-05-20 NOTE — Transfer of Care (Signed)
Immediate Anesthesia Transfer of Care Note  Patient: Shawn Lam  Procedure(s) Performed: Procedure(s):  CYSTOSCOPY WITH BILATERAL RETROGRADE PYELOGRAM,RIGHT  DIAGNOSTIC URETEROSCOPY ,LEFT URETEROSCOPY WITH HOLMIUM LASER  AND BILATERAL  STENT PLACEMENT  (Bilateral) HOLMIUM LASER APPLICATION (Bilateral)  Patient Location: PACU  Anesthesia Type:General  Level of Consciousness:  sedated, patient cooperative and responds to stimulation  Airway & Oxygen Therapy:Patient Spontanous Breathing and Patient connected to face mask oxgen  Post-op Assessment:  Report given to PACU RN and Post -op Vital signs reviewed and stable  Post vital signs:  Reviewed and stable  Last Vitals:  Filed Vitals:   05/20/15 0616 05/20/15 0932  BP: 110/63 131/68  Pulse: 62 74  Temp: 36.6 C   Resp: 18 14    Complications: No apparent anesthesia complications

## 2015-05-20 NOTE — Anesthesia Procedure Notes (Signed)
Procedure Name: LMA Insertion Date/Time: 05/20/2015 8:36 AM Performed by: Anne Fu Pre-anesthesia Checklist: Patient identified, Emergency Drugs available, Suction available, Patient being monitored and Timeout performed Patient Re-evaluated:Patient Re-evaluated prior to inductionOxygen Delivery Method: Circle system utilized Preoxygenation: Pre-oxygenation with 100% oxygen Intubation Type: IV induction Ventilation: Mask ventilation without difficulty LMA: LMA inserted LMA Size: 4.0 Number of attempts: 1 Placement Confirmation: positive ETCO2 and breath sounds checked- equal and bilateral Tube secured with: Tape

## 2015-05-20 NOTE — Discharge Instructions (Signed)
1 - You may have urinary urgency (bladder spasms) and bloody urine on / off with stent in place. This is normal.  2 - Call MD or go to ER for fever >102, severe pain / nausea / vomiting not relieved by medications, or acute change in medical status  3 - Remove tethered stents on Friday morning at home by pulling on string, then blue/white plastic tubing and discarding. There are two stents. Dr. Tresa Moore is in the office Friday afternoon if any issues arise.   4 - Resume Plavix on Saturday morning.

## 2015-05-20 NOTE — Anesthesia Preprocedure Evaluation (Addendum)
Anesthesia Evaluation  Patient identified by MRN, date of birth, ID band Patient awake    Reviewed: Allergy & Precautions, NPO status , Patient's Chart, lab work & pertinent test results  Airway Mallampati: II  TM Distance: >3 FB Neck ROM: Full    Dental no notable dental hx.    Pulmonary sleep apnea ,    Pulmonary exam normal breath sounds clear to auscultation       Cardiovascular Exercise Tolerance: Good (-) angina+ CAD and + Cardiac Stents  Normal cardiovascular exam Rhythm:Regular Rate:Normal     Neuro/Psych negative neurological ROS  negative psych ROS   GI/Hepatic negative GI ROS, Neg liver ROS, neg GERD  ,  Endo/Other  negative endocrine ROS  Renal/GU negative Renal ROS  negative genitourinary   Musculoskeletal negative musculoskeletal ROS (+)   Abdominal   Peds negative pediatric ROS (+)  Hematology negative hematology ROS (+)   Anesthesia Other Findings   Reproductive/Obstetrics negative OB ROS                            Anesthesia Physical Anesthesia Plan  ASA: III  Anesthesia Plan: General   Post-op Pain Management:    Induction: Intravenous  Airway Management Planned: LMA  Additional Equipment:   Intra-op Plan:   Post-operative Plan: Extubation in OR  Informed Consent: I have reviewed the patients History and Physical, chart, labs and discussed the procedure including the risks, benefits and alternatives for the proposed anesthesia with the patient or authorized representative who has indicated his/her understanding and acceptance.   Dental advisory given  Plan Discussed with: CRNA  Anesthesia Plan Comments:         Anesthesia Quick Evaluation

## 2015-05-20 NOTE — H&P (Signed)
Shawn Lam is an 65 y.o. male.    Chief Complaint: Pre-op Bilateral Ureteroscopic Stone Manipulation  HPI:   1 - Recurrent Nephrolithiasis -  Pre 2017 - left ureteroscopy for 59mm stone 05/2015 - Rt UVJ 45mm and left 45mm ureteral + multifocal renal by ER CT on eval left flank pain. Left hydro only. Cr 1.2. UA withtou infectious parameters.  Today "Shawn Lam" is seen to proceed with semi-elective bilateral ureteroscopic stone manipulation for his bilateral ureteral stones. No interval fevers. Still voiding.   Past Medical History  Diagnosis Date  . Anxiety   . Urticaria   . Rosacea   . Insomnia   . Hematuria   . CAD (coronary artery disease) 06/2005    PCI of LAD and RCA  . Orthostatic hypotension     negative tilt table  . Hyperlipidemia   . Asymptomatic LV dysfunction     mild with EF 48%  . Renal disorder     PT IS SEEING DR. Lorrene Reid- NEPHROLOGIST  . Berger's disease   . Left ureteral stone   . Depression   . Anemia     Past Surgical History  Procedure Laterality Date  . Left knee cap  surgery about 40 yrs ago  ? 1970    . Cardiac catheterization  09/06/2013    [facility]  . Cardiac catheterization    . Coronary angioplasty  2004    STENT PLACEMENT  . Left heart catheterization with coronary angiogram N/A 09/06/2013    Procedure: LEFT HEART CATHETERIZATION WITH CORONARY ANGIOGRAM;  Surgeon: Sinclair Grooms, MD;  Location: [facility] CATH LAB;  Service: Cardiovascular;  Laterality: N/A;  . Cystoscopy with retrograde pyelogram, ureteroscopy and stent placement Left 03/19/2014    Procedure: [place], URETEROSCOPY AND STENT PLACEMENT;  Surgeon: Junious Dresser, MD;  Location: [facility];  Service: Urology;  Laterality: Left;  . Stone extraction with basket Left 03/19/2014    Procedure: STONE EXTRACTION WITH BASKET;  Surgeon: Junious Dresser, MD;  Location: [facility];  Service: Urology;  Laterality: Left;    Family History  Problem Relation Age of Onset  .  Coronary artery disease Mother   . Heart attack Mother   . Heart disease Mother   . Hypertension Mother   . Cancer Father     lung and colon  . Diabetes Brother   . Lung cancer Father     smoker   Social History:  reports that he has never smoked. He has never used smokeless tobacco. He reports that he does not drink alcohol or use illicit drugs.  Allergies:  Allergies  Allergen Reactions  . Codeine Nausea Only  . Tape Other (See Comments)    Use only paper tape. Severe blistering and bruising with other tapes. No cloth tape.    Medications Prior to Admission  Medication Sig Dispense Refill  . aspirin EC 81 MG tablet Take 81 mg by mouth daily.    [name] atorvastatin (LIPITOR) 40 MG tablet TAKE 1 TABLET BY MOUTH DAILY. 90 tablet 2  . clopidogrel (PLAVIX) 75 MG tablet Take 1 tablet (75 mg total) by mouth daily. 90 tablet 3  . docusate sodium (COLACE) 100 MG capsule Take 1 tablet once or twice daily as needed for constipation while taking narcotic pain medicine (Patient taking differently: Take 100-200 mg by mouth daily as needed (For constipation while taking narcotic pain medicine.). ) 30 capsule 0  . ferrous sulfate 325 (65 FE) MG tablet  TAKE 1 TABLET BY MOUTH 3 TIMES DAILY WITH MEALS. 270 tablet 3  . fludrocortisone (FLORINEF) 0.1 MG tablet TAKE 1 TABLET (0.1 MG TOTAL) BY MOUTH DAILY. 90 tablet 3  . FLUOCINOLONE ACETONIDE BODY 0.01 % OIL Apply 1 application topically daily as needed (for dry skin).     . hydrOXYzine (ATARAX/VISTARIL) 25 MG tablet Take 50 mg by mouth at bedtime as needed for anxiety.     . midodrine (PROAMATINE) 5 MG tablet TAKE 1 TABLET (5 MG TOTAL) BY MOUTH 2 (TWO) TIMES DAILY WITH BREAKFAST AND LUNCH. (Patient taking differently: Take 5 mg by mouth 2 (two) times daily with a meal. ) 180 tablet 6  . Multiple Vitamins-Minerals (CENTRUM ADULTS PO) Take 1 tablet by mouth every morning.     . nitroGLYCERIN (NITROSTAT) 0.4 MG SL tablet Place 1 tablet (0.4 mg total) under the  tongue every 5 (five) minutes as needed for chest pain. 25 tablet 5  . omega-3 acid ethyl esters (LOVAZA) 1 G capsule Take 2 g by mouth 2 (two) times daily.     . ondansetron (ZOFRAN) 4 MG tablet Take 1-2 tabs by mouth every 8 hours as needed for nausea/vomiting (Patient taking differently: Take 4-8 mg by mouth every 8 (eight) hours as needed for nausea or vomiting. ) 30 tablet 0  . oxyCODONE-acetaminophen (ROXICET) 5-325 MG tablet Take 1-2 tablets by mouth every 4 (four) hours as needed for severe pain. 30 tablet 0  . RESTASIS 0.05 % ophthalmic emulsion Place 1 drop into both eyes 2 (two) times daily as needed (For dry eyes.).   6  . sertraline (ZOLOFT) 100 MG tablet TAKE 2 TABLETS (200 MG TOTAL) BY MOUTH DAILY. 180 tablet 1  . tamsulosin (FLOMAX) 0.4 MG CAPS capsule Take 1 tablet by mouth daily until you pass the kidney stone or no longer have symptoms (Patient taking differently: Take 0.4 mg by mouth daily as needed (For kidney stones until they are passed and no longer have symptoms.). ) 14 capsule 0  . vitamin B-12 (CYANOCOBALAMIN) 1000 MCG tablet Take 1,000 mcg by mouth at bedtime.     . ALPRAZolam (XANAX) 0.5 MG tablet TAKE 1 TABLET ONCE A DAY AS NEEDED 30 tablet 0    No results found for this or any previous visit (from the past 48 hour(s)). No results found.  Review of Systems  Constitutional: Negative.   HENT: Negative.   Eyes: Negative.   Respiratory: Negative.   Cardiovascular: Negative.   Gastrointestinal: Positive for nausea. Negative for vomiting.  Genitourinary: Positive for flank pain.  Musculoskeletal: Negative.   Skin: Negative.   Neurological: Negative.   Endo/Heme/Allergies: Negative.   Psychiatric/Behavioral: Negative.     Blood pressure 110/63, pulse 62, temperature 97.8 F (36.6 C), temperature source Oral, resp. rate 18, height 6' (1.829 m), weight 68.153 kg (150 lb 4 oz), SpO2 99 %. Physical Exam  Constitutional: He appears well-developed.  HENT:  Head:  Normocephalic.  Eyes: Pupils are equal, round, and reactive to light.  Neck: Normal range of motion.  Cardiovascular: Normal rate.   Respiratory: Effort normal.  GI: Soft.  Genitourinary: Penis normal.  Mild left CVAT  Musculoskeletal: Normal range of motion.  Neurological: He is alert.  Skin: Skin is warm.  Psychiatric: He has a normal mood and affect. His behavior is normal. Judgment and thought content normal.     Assessment/Plan  1 - Recurrent Nephrolithiasis - impending bilateral obsruction. Suggest proceed as planned with bilateral URS with goal of  stone free. Will plan on peri-op stents given bilateral procedure.  We rediscussed ureteroscopic stone manipulation with basketing and laser-lithotripsy in detail.  We rediscussed risks including bleeding, infection, damage to kidney / ureter  bladder, rarely loss of kidney. We rediscussed anesthetic risks and rare but serious surgical complications including DVT, PE, MI, and mortality. We specifically readdressed that in 5-10% of cases a staged approach is required with stenting followed by re-attempt ureteroscopy if anatomy unfavorable.   The patient voiced understanding and wises to proceed today as planned.   Alexis Frock, MD 05/20/2015, 7:47 AM

## 2015-05-20 NOTE — Anesthesia Postprocedure Evaluation (Signed)
Anesthesia Post Note  Patient: KARMAN ZIMAN  Procedure(s) Performed: Procedure(s) (LRB):  CYSTOSCOPY WITH BILATERAL RETROGRADE PYELOGRAM,RIGHT  DIAGNOSTIC URETEROSCOPY ,LEFT URETEROSCOPY WITH HOLMIUM LASER  AND BILATERAL  STENT PLACEMENT  (Bilateral) HOLMIUM LASER APPLICATION (Bilateral)  Patient location during evaluation: PACU Anesthesia Type: General Level of consciousness: awake and alert Pain management: pain level controlled Vital Signs Assessment: post-procedure vital signs reviewed and stable Respiratory status: spontaneous breathing, nonlabored ventilation, respiratory function stable and patient connected to nasal cannula oxygen Cardiovascular status: blood pressure returned to baseline and stable Postop Assessment: no signs of nausea or vomiting Anesthetic complications: no    Last Vitals:  Filed Vitals:   05/20/15 1015 05/20/15 1030  BP: 124/64 133/57  Pulse: 68 69  Temp: 36.7 C 36.4 C  Resp: 16 16    Last Pain:  Filed Vitals:   05/20/15 1030  PainSc: 3                  Montez Hageman

## 2015-06-01 ENCOUNTER — Other Ambulatory Visit: Payer: Self-pay | Admitting: Family Medicine

## 2015-06-01 NOTE — Telephone Encounter (Signed)
Last office visit 02/03/2015. Not on current medication list.  Refill?

## 2015-06-02 NOTE — Telephone Encounter (Signed)
Alprazolam called into [facility] [address]

## 2015-06-16 DIAGNOSIS — N200 Calculus of kidney: Secondary | ICD-10-CM | POA: Diagnosis not present

## 2015-06-17 DIAGNOSIS — N200 Calculus of kidney: Secondary | ICD-10-CM | POA: Diagnosis not present

## 2015-06-29 ENCOUNTER — Other Ambulatory Visit: Payer: Self-pay | Admitting: Family Medicine

## 2015-06-29 NOTE — Telephone Encounter (Signed)
Last office visit 02/03/2015.  Last refilled 06/02/2015 for #30 with no refills.  Ok to refill?

## 2015-06-30 NOTE — Telephone Encounter (Signed)
Alprazolam called into [facility] [address]

## 2015-07-07 DIAGNOSIS — Z961 Presence of intraocular lens: Secondary | ICD-10-CM | POA: Diagnosis not present

## 2015-07-07 DIAGNOSIS — H18413 Arcus senilis, bilateral: Secondary | ICD-10-CM | POA: Diagnosis not present

## 2015-07-07 DIAGNOSIS — H16223 Keratoconjunctivitis sicca, not specified as Sjogren's, bilateral: Secondary | ICD-10-CM | POA: Diagnosis not present

## 2015-07-07 DIAGNOSIS — H40013 Open angle with borderline findings, low risk, bilateral: Secondary | ICD-10-CM | POA: Diagnosis not present

## 2015-07-17 DIAGNOSIS — R3129 Other microscopic hematuria: Secondary | ICD-10-CM | POA: Diagnosis not present

## 2015-07-17 DIAGNOSIS — Z0000 Encounter for general adult medical examination without abnormal findings: Secondary | ICD-10-CM | POA: Diagnosis not present

## 2015-07-17 DIAGNOSIS — R8299 Other abnormal findings in urine: Secondary | ICD-10-CM | POA: Diagnosis not present

## 2015-07-17 DIAGNOSIS — N200 Calculus of kidney: Secondary | ICD-10-CM | POA: Diagnosis not present

## 2015-07-17 DIAGNOSIS — Z125 Encounter for screening for malignant neoplasm of prostate: Secondary | ICD-10-CM | POA: Diagnosis not present

## 2015-07-17 DIAGNOSIS — R34 Anuria and oliguria: Secondary | ICD-10-CM | POA: Diagnosis not present

## 2015-07-27 ENCOUNTER — Other Ambulatory Visit: Payer: Self-pay | Admitting: Cardiology

## 2015-07-27 ENCOUNTER — Other Ambulatory Visit: Payer: Self-pay | Admitting: Family Medicine

## 2015-07-27 NOTE — Telephone Encounter (Signed)
Last office visit 02/03/2015.  Last refilled 06/30/2015 for #30 with no refills.  Ok to refill?

## 2015-07-28 NOTE — Telephone Encounter (Signed)
Alprazolam called into [facility] [address]

## 2015-08-07 ENCOUNTER — Other Ambulatory Visit: Payer: Self-pay | Admitting: Family Medicine

## 2015-08-10 ENCOUNTER — Other Ambulatory Visit: Payer: Self-pay | Admitting: Cardiology

## 2015-08-26 ENCOUNTER — Encounter: Payer: Self-pay | Admitting: Family Medicine

## 2015-08-26 NOTE — Telephone Encounter (Signed)
Last office visit 02/03/2015.  Last refilled 07/28/2015 for #30 with no refills.  Wants to increase to one tablet two times a day.  Ok to refill?

## 2015-08-27 ENCOUNTER — Other Ambulatory Visit: Payer: Self-pay | Admitting: Family Medicine

## 2015-08-27 MED ORDER — ALPRAZOLAM 0.5 MG PO TABS: ORAL_TABLET | ORAL | Status: DC

## 2015-08-27 NOTE — Telephone Encounter (Signed)
Alprazolam called into [facility] [address]

## 2015-09-28 ENCOUNTER — Other Ambulatory Visit: Payer: Self-pay | Admitting: Family Medicine

## 2015-09-28 NOTE — Telephone Encounter (Signed)
Last office visit 02/03/2016.  Last refilled 08/27/2015 for #60 with no refills.  Ok to refill?

## 2015-09-29 ENCOUNTER — Other Ambulatory Visit: Payer: Self-pay | Admitting: Family Medicine

## 2015-09-29 NOTE — Telephone Encounter (Signed)
Alprazolam called into [facility] [address]

## 2015-10-26 ENCOUNTER — Other Ambulatory Visit: Payer: Self-pay | Admitting: Family Medicine

## 2015-10-26 NOTE — Telephone Encounter (Signed)
Last office visit 02/03/2015.  Last refilled 09/29/2015 for #60 with no refills.  Ok to refill?

## 2015-10-27 NOTE — Telephone Encounter (Signed)
Alprazolam called into [facility] [address]

## 2015-10-29 ENCOUNTER — Other Ambulatory Visit: Payer: Self-pay | Admitting: Cardiology

## 2015-11-03 ENCOUNTER — Encounter: Payer: Self-pay | Admitting: Family Medicine

## 2015-11-03 DIAGNOSIS — F411 Generalized anxiety disorder: Secondary | ICD-10-CM

## 2015-11-04 ENCOUNTER — Telehealth: Payer: Self-pay | Admitting: Family Medicine

## 2015-11-04 NOTE — Telephone Encounter (Signed)
Pt placed on [facility] WQ. Pt is aware they will call him to schedule

## 2015-11-09 ENCOUNTER — Encounter (HOSPITAL_COMMUNITY): Payer: Self-pay

## 2015-11-17 ENCOUNTER — Encounter (HOSPITAL_COMMUNITY): Payer: Self-pay

## 2015-11-18 NOTE — Progress Notes (Signed)
Created in error

## 2015-11-20 ENCOUNTER — Encounter: Payer: Self-pay | Admitting: Licensed Clinical Social Worker

## 2015-11-20 ENCOUNTER — Ambulatory Visit (INDEPENDENT_AMBULATORY_CARE_PROVIDER_SITE_OTHER): Payer: Medicare Other | Admitting: Licensed Clinical Social Worker

## 2015-11-20 DIAGNOSIS — F32A Depression, unspecified: Secondary | ICD-10-CM | POA: Insufficient documentation

## 2015-11-20 DIAGNOSIS — F320 Major depressive disorder, single episode, mild: Secondary | ICD-10-CM | POA: Insufficient documentation

## 2015-11-20 DIAGNOSIS — F329 Major depressive disorder, single episode, unspecified: Secondary | ICD-10-CM | POA: Diagnosis not present

## 2015-11-20 DIAGNOSIS — F331 Major depressive disorder, recurrent, moderate: Secondary | ICD-10-CM | POA: Insufficient documentation

## 2015-11-20 DIAGNOSIS — F411 Generalized anxiety disorder: Secondary | ICD-10-CM | POA: Diagnosis not present

## 2015-11-20 NOTE — Progress Notes (Signed)
Comprehensive Clinical Assessment (CCA) Note  11/20/2015 Shawn Lam [id number]  Visit Diagnosis:      ICD-9-CM ICD-10-CM   1. Generalized anxiety disorder 300.02 F41.1   2. Mild depression 311 F32.9       CCA Part One  Part One has been completed on paper by the patient.  (See scanned document in Chart Review)  CCA Part Two A  Intake/Chief Complaint:  CCA Intake With Chief Complaint CCA Part Two Date: 11/20/15 CCA Part Two Time: 0932 Chief Complaint/Presenting Problem: Mostly it is stress, fatigue, being nervous. It has been 15 years. He feels like that everyday. Dr. Diona Browner. He has been going to her for 15 years. She had been giving him medicine and hasn't done that well Xanax, Zoloft Patients Currently Reported Symptoms/Problems: stress, fatigue, nervousness Collateral Involvement: n/a Individual's Strengths: Social, he doesn't want to be around a lot of people, when son and grand kids get on his nerves, it bothers him that he feels that way, quickly agitated,  Individual's Preferences: decrease anxiety, help with nerves, help with stress, run Individual's Abilities: play golf, yardwork, watch sports, played football and baseball but lost interest Type of Services Patient Feels Are Needed: psychiatrist, therapy Initial Clinical Notes/Concerns: Correlated beginning of depression with start of heart issues and having two stents put in his hear, also has kidney issues  Mental Health Symptoms Depression:  Depression: Change in energy/activity, Fatigue, Irritability, Sleep (too much or little) (denies SI, denies past SI, past SA, denies past SIB)  Mania:     Anxiety:   Anxiety: Fatigue, Irritability, Restlessness, Sleep, Tension, Worrying (daily, excessively, could be anything, worries about the kids, wife, denies panic attacks)  Psychosis:  Psychosis: N/A  Trauma:  Trauma: N/A  Obsessions:  Obsessions: N/A  Compulsions:  Compulsions: N/A  Inattention:  Inattention: N/A   Hyperactivity/Impulsivity:  Hyperactivity/Impulsivity: N/A  Oppositional/Defiant Behaviors:  Oppositional/Defiant Behaviors: N/A  Borderline Personality:  Emotional Irregularity: N/A  Other Mood/Personality Symptoms:      Mental Status Exam Appearance and self-care  Stature:  Stature: Tall  Weight:  Weight: Thin  Clothing:  Clothing: Casual  Grooming:  Grooming: Normal  Cosmetic use:  Cosmetic Use: None  Posture/gait:  Posture/Gait: Normal  Motor activity:  Motor Activity: Not Remarkable, slight shakiness at times  Sensorium  Attention:  Attention: Normal  Concentration:  Concentration: Normal  Orientation:  Orientation: X5  Recall/memory:  Recall/Memory: Defective in short-term, Defective in Remote  Affect and Mood  Affect:  Affect: Appropriate  Mood:  Mood: Anxious  Relating  Eye contact:  Eye Contact: Normal  Facial expression:  Facial Expression: Responsive  Attitude toward examiner:  Attitude Toward Examiner: Cooperative  Thought and Language  Speech flow: Speech Flow: Normal  Thought content:  Thought Content: Appropriate to mood and circumstances  Preoccupation:     Hallucinations:     Organization:     Transport planner of Knowledge:  Fund of Knowledge: Average  Intelligence:  Intelligence: Average  Abstraction:  Abstraction: Normal  Judgement:  Judgement: Fair  Art therapist:  Reality Testing: Realistic  Insight:  Insight: Fair  Decision Making:  Decision Making: Paralyzed  Social Functioning  Social Maturity:  Social Maturity: Isolates, Responsible  Social Judgement:  Social Judgement: Normal  Stress  Stressors:  Stressors:  (n/a)  Coping Ability:  Coping Ability: Research officer, political party Deficits:     Supports:      Family and Psychosocial History: Family history Marital status: Married Number of Years Married: 47  What types of issues is patient dealing with in the relationship?: no, second marriage Additional relationship information: lives with  wife, supports-wife Are you sexually active?: Yes What is your sexual orientation?: heterosexual Has your sexual activity been affected by drugs, alcohol, medication, or emotional stress?: no Does patient have children?: Yes How many children?: 3 How is patient's relationship with their children?: 50-wife's son, 54, 38-good, except for wanting them around all the time  Childhood History:  Childhood History By whom was/is the patient raised?: Both parents Additional childhood history information: good Description of patient's relationship with caregiver when they were a child: mom, dad-good Patient's description of current relationship with people who raised him/her: passed How were you disciplined when you got in trouble as a child/adolescent?: they spanked them but not severe, he deserved it Does patient have siblings?: Yes Number of Siblings: 1 Description of patient's current relationship with siblings: younger-fine Did patient suffer any verbal/emotional/physical/sexual abuse as a child?: No Did patient suffer from severe childhood neglect?: No Has patient ever been sexually abused/assaulted/raped as an adolescent or adult?: No Was the patient ever a victim of a crime or a disaster?: No Witnessed domestic violence?: No Has patient been effected by domestic violence as an adult?: No  CCA Part Two B  Employment/Work Situation: Employment / Work Copywriter, advertising Employment situation: Retired (Retire-10 years ago, worked for [employer]) What is the longest time patient has a held a job?: 35 Where was the patient employed at that time?: [employer] Has patient ever been in the [employer]?: No Has patient ever served in combat?: No Did You Receive Any Psychiatric Treatment/Services While in Passenger transport manager?: No  Education: Education School Currently Attending: no Last Grade Completed: 14 Name of [place]: [employer] [phone] Did You Graduate From [employer]?: No Did You Attend  College?: Yes What Was Your Major?: business Did You Have Any Special Interests In School?: history Did You Have An Individualized Education Program (IIEP): No Did You Have Any Difficulty At [employer]?: No  Religion: Religion/Spirituality Are You A Religious Person?: Yes What is Your Religious Affiliation?: Baptist How Might This Affect Treatment?: no  Leisure/Recreation: Leisure / Recreation Leisure and Hobbies: gold, running  Exercise/Diet: Exercise/Diet Do You Exercise?: Yes What Type of Exercise Do You Do?: Run/Walk How Many Times a Week Do You Exercise?: 6-7 times a week Have You Gained or Lost A Significant Amount of Weight in the Past Six Months?: No Do You Follow a Special Diet?: No Do You Have Any Trouble Sleeping?: Yes Explanation of Sleeping Difficulties: only 4 hours a night, fall asleep, but wakes up, went to a sleep test and everything was okay  CCA Part Two C  Alcohol/Drug Use: Alcohol / Drug Use Pain Medications: n/a Prescriptions: see med list Over the Counter: see med list History of alcohol / drug use?: No history of alcohol / drug abuse                      CCA Part Three  ASAM's:  Six Dimensions of Multidimensional Assessment  Dimension 1:  Acute Intoxication and/or Withdrawal Potential:     Dimension 2:  Biomedical Conditions and Complications:     Dimension 3:  Emotional, Behavioral, or Cognitive Conditions and Complications:     Dimension 4:  Readiness to Change:     Dimension 5:  Relapse, Continued use, or Continued Problem Potential:     Dimension 6:  Recovery/Living Environment:      Substance  use Disorder (SUD)    Social Function:  Social Functioning Social Maturity: Isolates, Responsible Social Judgement: Normal  Stress:  Stress Stressors:  (n/a) Coping Ability: Exhausted Patient Takes Medications The Way The Doctor Instructed?: Yes  Risk Assessment- Self-Harm Potential: Risk Assessment For Self-Harm Potential Thoughts of  Self-Harm: No current thoughts Method: No plan Availability of Means: No access/NA  Risk Assessment -Dangerous to Others Potential: Risk Assessment For Dangerous to Others Potential Method: No Plan Availability of Means: No access or NA Intent: Vague intent or NA  DSM5 Diagnoses: Patient Active Problem List   Diagnosis Date Noted  . Mild depression 11/20/2015  . Elevated LFTs 02/27/2014  . Snoring 02/05/2014  . OSA (obstructive sleep apnea) 02/05/2014  . Orthostatic hypotension 02/07/2013  . Microcytic anemia 01/31/2013  . Scotoma involving central area 01/29/2013  . HEMATURIA UNSPECIFIED 01/21/2010  . Prediabetes 06/24/2008  . Hyperlipidemia 07/05/2006  . Generalized anxiety disorder 07/05/2006  . Coronary atherosclerosis 07/05/2006  . GERD 07/05/2006    Patient Centered Plan: Patient is on the following Treatment Plan(s):  Anxiety and Depression  Recommendations for Services/Supports/Treatments: Recommendations for Services/Supports/Treatments Recommendations For Services/Supports/Treatments: Medication Management, Individual Therapy  Treatment Plan Summary: Patient is a 65 year old married male who was referred by his primary care doctor, Dr.Bedsole, who has been treating him for anxiety but has not adequately treated his symptoms for anxiety. He describes daily stress, fatigue, and relates getting easily agitated and frustrated. He relates losing interest in activities that he used to be interested in not wanting to be around people. He reports having maximum of 4 hours of sleep. He correlates start his symptoms with heart problems 15 years ago and has been treated by his primary care doctor for depression and anxiety. He does not report any other psychiatric treatment. He denies SI, past SI, SA or SIB. Denies drug and alcohol abuse. Patient is recommended for medication management and individual therapy to work on emotional regulation skills, coping strategies to manage  emotions and supportive interventions.     Referrals to Alternative Service(s): Referred to Alternative Service(s):   Place:   Date:   Time:    Referred to Alternative Service(s):   Place:   Date:   Time:    Referred to Alternative Service(s):   Place:   Date:   Time:    Referred to Alternative Service(s):   Place:   Date:   Time:     Bowman,Mary A

## 2015-11-23 ENCOUNTER — Other Ambulatory Visit: Payer: Self-pay | Admitting: Family Medicine

## 2015-11-23 NOTE — Telephone Encounter (Signed)
[facility] electronically request refill alprazolam. Last refilled #[phone] on 10/27/15.last annual 02/03/15. Pt scheduled 02/09/16 CPX. Please advise.

## 2015-11-24 ENCOUNTER — Other Ambulatory Visit: Payer: Self-pay | Admitting: Family Medicine

## 2015-12-03 ENCOUNTER — Ambulatory Visit (INDEPENDENT_AMBULATORY_CARE_PROVIDER_SITE_OTHER): Payer: Medicare Other | Admitting: Licensed Clinical Social Worker

## 2015-12-03 DIAGNOSIS — F411 Generalized anxiety disorder: Secondary | ICD-10-CM

## 2015-12-03 DIAGNOSIS — F320 Major depressive disorder, single episode, mild: Secondary | ICD-10-CM

## 2015-12-03 DIAGNOSIS — F329 Major depressive disorder, single episode, unspecified: Secondary | ICD-10-CM | POA: Diagnosis not present

## 2015-12-03 DIAGNOSIS — F32A Depression, unspecified: Secondary | ICD-10-CM

## 2015-12-03 NOTE — Progress Notes (Signed)
   THERAPIST PROGRESS NOTE  Session Time: 11 AM to 11:55 AM  Participation Level: Active  Behavioral Response: CasualAlertAnxious  Type of Therapy: Individual Therapy  Treatment Goals addressed:  decrease in anxiety, able to relax and not be stressed, patient increase positive interaction with others  Interventions: CBT, Solution Focused, Strength-based, Supportive and Other: Relaxation and mindfulness  Summary: Shawn Lam is a 65 y.o. male who presents with relating that symptoms have been the same and this includes not being able to relax, feeling stressed out, not being able to identify a trigger and noticeably in session shaky and legs shaking. Identified a specific situation that causes irritability of his sons coming over to the house. Recognizes he is engaging in in negative fortune telling in the future and has to work on recognizing and challenging thought distortions  related that he gets irritated when grandchildren come over. Worked on problem solving and help manage the situation better as well as engaging in positive self talk to help with symptoms. Recognizes experiences of prelonged stress, and past experiences of uncontrollable or traumatic events related to heart sugery and how it changed his life. Recognizes this may relate to his current irritability. He describes having anxiety about the past and what someone did to him or something that  happened. He is aovider. He can't let go of things. Concerned about lack sedation strategies and that they may make him tired. Completed treatment plan and he wants to work on decrease of anxiety as indicated by letting go of things he can't control and threw positive interactions with others.  Suicidal/Homicidal: No  Therapist Response: Educated patient about his diagnosis of generalized anxiety disorder. Discussed that anxiety is ineffective problem solving and that involves a thought distortion of mentally problem solving about  potentially negative events that have not happened yet. Therapist introduced CBT and explained that anxiety can be caused by prior thoughts and that we have to pay attention to our thoughts and challenge them when there are distortions. Encouraged patient to come up with some challenges and self talk that helps counter his self talk that is more realistic. Identified that patient seems to have free-floating anxiety and that relaxation strategies that would relax him physically could address his source of anxiety and help reduce anxiety. Identified underlying trauma that is patient's medical issues that have fed into his anxiety. Identified irritability as a significant symptom and discussed problem solving and the need to work on anger management. Discussed that avoiding is not helpful coping strategy and that stuffing feelings is not effective. Encouraged patient that he has to work on on bad mental habits, challenge unhelpful schemas and be more present minded. Encouraged patient to work on activities to help him be more present minded such as mindfulness. Completed treatment plan  Plan: Return again in 2 weeks.2. Patient complete self-monitoring of feelings form to better identify and manage his emotions. 3. Patient used application to guide him in mindfulness to help him begin relaxation strategies  Diagnosis: Axis I: Generalized Anxiety Disorder, Mild Depression    Axis II: No diagnosis    Yohanna Tow A, LCSW 12/03/2015

## 2015-12-08 ENCOUNTER — Encounter: Payer: Self-pay | Admitting: Psychiatry

## 2015-12-08 ENCOUNTER — Ambulatory Visit (INDEPENDENT_AMBULATORY_CARE_PROVIDER_SITE_OTHER): Payer: Medicare Other | Admitting: Psychiatry

## 2015-12-08 VITALS — BP 147/72 | HR 61 | Temp 97.8°F | Ht 72.0 in | Wt 154.6 lb

## 2015-12-08 DIAGNOSIS — F411 Generalized anxiety disorder: Secondary | ICD-10-CM | POA: Diagnosis not present

## 2015-12-08 DIAGNOSIS — F39 Unspecified mood [affective] disorder: Secondary | ICD-10-CM | POA: Diagnosis not present

## 2015-12-08 MED ORDER — SERTRALINE HCL 100 MG PO TABS: ORAL_TABLET | ORAL | 1 refills | Status: DC

## 2015-12-08 MED ORDER — ALPRAZOLAM 0.5 MG PO TABS: 0.5000 mg | ORAL_TABLET | Freq: Every day | ORAL | 0 refills | Status: DC | PRN

## 2015-12-08 MED ORDER — MIRTAZAPINE 15 MG PO TABS: 15.0000 mg | ORAL_TABLET | Freq: Every day | ORAL | 1 refills | Status: DC

## 2015-12-08 NOTE — Progress Notes (Signed)
Psychiatric Initial Adult Assessment   Patient Identification: Shawn Lam MRN:  [id number] Date of Evaluation:  12/08/2015 Referral Source: Stanton Kidney- Therapist Chief Complaint:   Chief Complaint    Establish Care; Anxiety; Stress; Fatigue     Visit Diagnosis:    ICD-9-CM ICD-10-CM   1. Episodic mood disorder ([place]) 296.90 F39   2. Anxiety state 300.00 F41.1     History of Present Illness:    Patient is a 65 year old married male who presented for initial assessment. He was referred by therapist married. He reported that he has history of anxiety and panic including anticipated treatment anxiety. He has been getting medications from her primary care physician Dr. Diona Browner. He reported that the medications are not helping him. He reported that he feels he has been feeling nervous and fidgety all the time. Patient reported that he runs early in the morning approximately 5-8 miles on a daily basis. He reported that he wakes up around 3 AM. He is not able to sleep well at night. He has been taking Zoloft 200 mg on a daily basis and Xanax 0.5 mg twice a day. He was noted to be anxious during the interview. He reported that he is a very important person and does not like people being around. Patient reported that his sons have told him that they will be visiting from [place] over the long weekend and he is already feeling anxious. When his grandsons will be visiting the house he feels anxious around them. He reported that he wants medications to control his anxiety. His wife usually tells him stupid go somewhere but he does not have a place to go as he is already retired for the past 10 years. He was calm and cooperative during the interview. He currently denied using any drugs at this time. His spends his time at home.   He denied having any perceptual disturbances. He denied having any suicidal homicidal ideations or plans.   Associated Signs/Symptoms: Depression Symptoms:  depressed  mood, insomnia, psychomotor retardation, fatigue, anxiety, loss of energy/fatigue, disturbed sleep, weight loss, decreased appetite, (Hypo) Manic Symptoms:  Labiality of Mood, Anxiety Symptoms:  Excessive Worry, Psychotic Symptoms:  none PTSD Symptoms: Negative NA  Past Psychiatric History:  Pt reported history of anxiety and has been taking medications including Zoloft and Xanax prescribed by his primary care physician. He denied any previous history of psychiatric hospitalization. No history of suicide attempt.  Previous Psychotropic Medications: Xanax 0.5 mg by mouth twice a day Zoloft 100 mg by mouth twice a day   Substance Abuse History in the last 12 months:  Yes.   3 beers/week  Consequences of Substance Abuse: Negative NA  Past Medical History:  Past Medical History:  Diagnosis Date  . Anemia   . Anxiety   . Asymptomatic LV dysfunction    mild with EF 48%  . Berger's disease   . CAD (coronary artery disease) 06/2005   PCI of LAD and RCA  . Depression   . Hematuria   . Hyperlipidemia   . Insomnia   . Left ureteral stone   . Orthostatic hypotension    negative tilt table  . Renal disorder    PT IS SEEING DR. Lorrene Reid- NEPHROLOGIST  . Rosacea   . Urticaria     Past Surgical History:  Procedure Laterality Date  . CARDIAC CATHETERIZATION  09/06/2013   [place]  . CARDIAC CATHETERIZATION    . CORONARY ANGIOPLASTY  2004   STENT PLACEMENT  . CYSTOSCOPY  WITH RETROGRADE PYELOGRAM, URETEROSCOPY AND STENT PLACEMENT Left 03/19/2014   Procedure: CYSTOSCOPY WITH RETROGRADE PYELOGRAM, URETEROSCOPY AND STENT PLACEMENT;  Surgeon: Junious Dresser, MD;  Location: [facility];  Service: Urology;  Laterality: Left;  . CYSTOSCOPY WITH RETROGRADE PYELOGRAM, URETEROSCOPY AND STENT PLACEMENT Bilateral 05/20/2015   Procedure:  CYSTOSCOPY WITH BILATERAL RETROGRADE PYELOGRAM,RIGHT  DIAGNOSTIC URETEROSCOPY ,LEFT URETEROSCOPY WITH HOLMIUM LASER  AND BILATERAL  STENT PLACEMENT ;  Surgeon:  Alexis Frock, MD;  Location: [facility];  Service: Urology;  Laterality: Bilateral;  . HOLMIUM LASER APPLICATION Bilateral [id number]   Procedure: HOLMIUM LASER APPLICATION;  Surgeon: Alexis Frock, MD;  Location: [facility];  Service: Urology;  Laterality: Bilateral;  . LEFT HEART CATHETERIZATION WITH CORONARY ANGIOGRAM N/A 09/06/2013   Procedure: LEFT HEART CATHETERIZATION WITH CORONARY ANGIOGRAM;  Surgeon: Sinclair Grooms, MD;  [facility] C[facility];  Service: Cardiovascular;  Laterality: N/A;  . LEFT KNEE CAP  SURGERY ABOUT 40 YRS AGO  ? 1970    . STONE EXTRACTION WITH BASKET Left 03/19/2014   Procedure: STONE EXTRACTION WITH BASKET;  Surgeon: Junious Dresser, MD;  Location: [facility];  Service: Urology;  Laterality: Left;    Family Psychiatric History:  Pt denied  Family History:  Family History  Problem Relation Age of Onset  . Coronary artery disease Mother   . Heart attack Mother   . Heart disease Mother   . Hypertension Mother   . Cancer Father     lung and colon  . Lung cancer Father     smoker  . Diabetes Brother     Social History:   Social History   Social History  . Marital status: Married    Spouse name: N/A  . Number of children: N/A  . Years of education: N/A   Occupational History  . retired Print production planner    Social History Main Topics  . Smoking status: Never Smoker  . Smokeless tobacco: Never Used  . Alcohol use None  . Drug use: No  . Sexual activity: Yes   Other Topics Concern  . 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  Worked for [employer].   Allergies:   Allergies  Allergen Reactions  . Codeine Nausea Only  . Tape Other (See Comments)    Use only paper tape. Severe blistering and bruising with other tapes. No cloth tape.    Metabolic Disorder  Labs: Lab Results  Component Value Date   HGBA1C 5.6 01/27/2015   No results found for: PROLACTIN Lab Results  Component Value Date   CHOL 176 01/27/2015   TRIG 51.0 01/27/2015   HDL 96.20 01/27/2015   CHOLHDL 2 01/27/2015   VLDL 10.2 01/27/2015   LDLCALC 70 01/27/2015   LDLCALC 61 05/21/2014     Current Medications: Current Outpatient Prescriptions  Medication Sig Dispense Refill  . ALPRAZolam (XANAX) 0.5 MG tablet TAKE 1 TABLET BY MOUTH TWICE A DAY AS NEEDED 60 tablet 0  . aspirin EC 81 MG tablet Take 81 mg by mouth daily.    [name] atorvastatin (LIPITOR) 40 MG tablet TAKE 1 TABLET BY MOUTH DAILY. 90 tablet 2  . clopidogrel (PLAVIX) 75 MG tablet TAKE 1 TABLET (75 MG TOTAL) BY MOUTH DAILY. 90 tablet 1  .  docusate sodium (COLACE) 100 MG capsule Take 1 tablet once or twice daily as needed for constipation while taking narcotic pain medicine 30 capsule 0  . ferrous sulfate 325 (65 FE) MG tablet TAKE 1 TABLET BY MOUTH 3 TIMES DAILY WITH MEALS. 270 tablet 3  . fludrocortisone (FLORINEF) 0.1 MG tablet TAKE 1 TABLET (0.1 MG TOTAL) BY MOUTH DAILY. 90 tablet 3  . FLUOCINOLONE ACETONIDE BODY 0.01 % OIL Apply 1 application topically daily as needed (for dry skin).     . hydrOXYzine (ATARAX/VISTARIL) 25 MG tablet Take 50 mg by mouth at bedtime as needed for anxiety.     . midodrine (PROAMATINE) 5 MG tablet TAKE 1 TABLET (5 MG TOTAL) BY MOUTH 2 (TWO) TIMES DAILY WITH BREAKFAST AND LUNCH. 180 tablet 0  . Multiple Vitamins-Minerals (CENTRUM ADULTS PO) Take 1 tablet by mouth every morning.     [name] NITROSTAT 0.4 MG SL tablet PLACE 1 TABLET (0.4 MG TOTAL) UNDER THE TONGUE EVERY 5 (FIVE) MINUTES AS NEEDED FOR CHEST PAIN. 25 tablet 5  . omega-3 acid ethyl esters (LOVAZA) 1 G capsule Take 2 g by mouth 2 (two) times daily.     . sertraline (ZOLOFT) 100 MG tablet TAKE 2 TABLETS (200 MG TOTAL) BY MOUTH DAILY. 180 tablet 1  . vitamin B-12 (CYANOCOBALAMIN) 1000 MCG tablet Take 1,000 mcg by mouth at bedtime.       No current facility-administered medications for this visit.     Neurologic: Headache: No Seizure: No Paresthesias:No  Musculoskeletal: Strength & Muscle Tone: within normal limits Gait & Station: normal Patient leans: N/A  Psychiatric Specialty Exam: ROS  Blood pressure (!) 147/72, pulse 61, temperature 97.8 F (36.6 C), temperature source Oral, height 6' (1.829 m), weight 154 lb 9.6 oz (70.1 kg).Body mass index is 20.97 kg/m.  General Appearance: Casual  Eye Contact:  Fair  Speech:  Clear and Coherent  Volume:  Normal  Mood:  Anxious  Affect:  Congruent  Thought Process:  Goal Directed  Orientation:  Full (Time, Place, and Person)  Thought Content:  WDL  Suicidal Thoughts:  No  Homicidal Thoughts:  No  Memory:  Immediate;   Fair Recent;   Fair Remote;   Fair  Judgement:  Fair  Insight:  Fair  Psychomotor Activity:  Normal  Concentration:  Concentration: Fair and Attention Span: Fair  Recall:  [employer] of Knowledge:Fair  Language: Fair  Akathisia:  No  Handed:  Right  AIMS (if indicated):    Assets:  Communication Skills Desire for Improvement Physical Health Social Support  ADL's:  Intact  Cognition: WNL  Sleep:  poor    Treatment Plan Summary: Medication management   Discussed with patient at length about the medications treatment risks benefits and alternatives.  I will start him on Remeron 15 mg by mouth daily at bedtime to help with his anxiety insomnia and depression. He will continue on Xanax 0.5 mg during lunchtime. He will continue on Zoloft 100 mg in the morning. We will continue with this therapy appointments as scheduled  Follow-up in 3 weeks or earlier depending on his symptoms   More than 50% of the time spent in psychoeducation, counseling and coordination of care.    This note was generated in part or whole with voice recognition software. Voice regonition is usually quite accurate but there are transcription errors that can and  very often do occur. I apologize for any typographical errors that were not detected and corrected.   Cinda Quest  Gretel Acre, MD 8/29/20173:16 PM

## 2015-12-10 DIAGNOSIS — E785 Hyperlipidemia, unspecified: Secondary | ICD-10-CM | POA: Diagnosis not present

## 2015-12-10 DIAGNOSIS — I2510 Atherosclerotic heart disease of native coronary artery without angina pectoris: Secondary | ICD-10-CM | POA: Diagnosis not present

## 2015-12-10 DIAGNOSIS — I951 Orthostatic hypotension: Secondary | ICD-10-CM | POA: Diagnosis not present

## 2015-12-10 DIAGNOSIS — N200 Calculus of kidney: Secondary | ICD-10-CM | POA: Diagnosis not present

## 2015-12-10 DIAGNOSIS — N182 Chronic kidney disease, stage 2 (mild): Secondary | ICD-10-CM | POA: Diagnosis not present

## 2015-12-10 DIAGNOSIS — R8299 Other abnormal findings in urine: Secondary | ICD-10-CM | POA: Diagnosis not present

## 2015-12-10 DIAGNOSIS — Z6829 Body mass index (BMI) 29.0-29.9, adult: Secondary | ICD-10-CM | POA: Diagnosis not present

## 2015-12-10 DIAGNOSIS — N028 Recurrent and persistent hematuria with other morphologic changes: Secondary | ICD-10-CM | POA: Diagnosis not present

## 2015-12-16 ENCOUNTER — Other Ambulatory Visit: Payer: Self-pay | Admitting: Nephrology

## 2015-12-16 DIAGNOSIS — N182 Chronic kidney disease, stage 2 (mild): Secondary | ICD-10-CM

## 2015-12-17 ENCOUNTER — Ambulatory Visit (INDEPENDENT_AMBULATORY_CARE_PROVIDER_SITE_OTHER): Payer: Medicare Other | Admitting: Licensed Clinical Social Worker

## 2015-12-17 DIAGNOSIS — F329 Major depressive disorder, single episode, unspecified: Secondary | ICD-10-CM

## 2015-12-17 DIAGNOSIS — F411 Generalized anxiety disorder: Secondary | ICD-10-CM

## 2015-12-17 DIAGNOSIS — F32A Depression, unspecified: Secondary | ICD-10-CM

## 2015-12-17 DIAGNOSIS — F320 Major depressive disorder, single episode, mild: Secondary | ICD-10-CM

## 2015-12-17 NOTE — Progress Notes (Signed)
   THERAPIST PROGRESS NOTE  Session Time: 11 AM to 11:50 AM  Participation Level: Active  Behavioral Response: CasualAlertEuthymic  Type of Therapy: Individual Therapy  Treatment Goals addressed:  decrease in anxiety, able to relax and not be stressed, patient increase positive interaction with others  Interventions: CBT, Solution Focused, Strength-based, Supportive and Anger Management Training  Summary: Shawn Lam is a 65 y.o. male who presents with still feeling nervous, anxious, on edge and easily frustrated. He describes racing and attempt intrusive thoughts frequently. Related recent episode of getting easily agitated and losing patient. Recognized thought that lead to his escalation and discussed the need to challenge unhelpful perspectives. Discussed that last week he received news from doctor about his kidneys and that he will need further testing. Related that increased his anxiety and he was thinking he did not understand why. Identified negative thought that increased anxiety that everything was going to be bad and discussed with therapist the need to challenge the thought and replace with something more realistic such as he does not know yet what the results of testing are going to be so not to react before he knows if there are are any medical issues. He recognizes it himself that he reacts to quickly and think before he reacts. He also realizes he needs to become more aware of his thoughts that lead to anxiety and irritability and challenge them.   Suicidal/Homicidal: No  Therapist Response: Introduced functional analysis of anxiety with patient. Therapist guided patient to process by having patient think about a recent time when he ended up really anxious and to think more in depth about the situation. Ask patient questions like what he was doing and where he was, what happened before, how he was feeling, when he noticed anxiety was escalating, and what difficult thoughts,  feelings, and physical sensations came up as a result. Asked what he ended up doing, did anything positive happening, and any negative consequences. Identified that patient is quick to react at needs to work on emotional regulation to slow to process down between emotions and actions. Related that awareness of emotions helps slow the process and gives one options with coping strategies. Also identified cognitions that feed into anxiety that patient needs to challenge such as thinking that everything is going to be bad before anything happens. Therapist encouraged patient to soften perspectives that will help with anxiety and irritability. Normalized patient's feelings around recent episode that created anxiety and provided supportive and strength-based interventions  Plan: Return again in 3 weeks.2. Patient complete anxiety monitor log to see patterns in his mood, activities, overall functioning and to help change unhelpful coping strategies.  Diagnosis: Axis I:  Generalized Anxiety Disorder, Mild Depression    Axis II: No diagnosis    [name] A, LCSW 12/17/2015

## 2015-12-18 ENCOUNTER — Ambulatory Visit
Admission: RE | Admit: 2015-12-18 | Discharge: 2015-12-18 | Disposition: A | Discharge status: Home / Self Care | Payer: Medicare Other | Source: Ambulatory Visit | Attending: Nephrology | Admitting: Nephrology

## 2015-12-18 DIAGNOSIS — N182 Chronic kidney disease, stage 2 (mild): Secondary | ICD-10-CM

## 2015-12-18 DIAGNOSIS — N189 Chronic kidney disease, unspecified: Secondary | ICD-10-CM | POA: Diagnosis not present

## 2015-12-23 ENCOUNTER — Ambulatory Visit (INDEPENDENT_AMBULATORY_CARE_PROVIDER_SITE_OTHER): Payer: Medicare Other | Admitting: Psychiatry

## 2015-12-23 ENCOUNTER — Encounter: Payer: Self-pay | Admitting: Psychiatry

## 2015-12-23 VITALS — BP 120/70 | HR 82 | Temp 98.4°F | Ht 72.0 in | Wt 153.8 lb

## 2015-12-23 DIAGNOSIS — F39 Unspecified mood [affective] disorder: Secondary | ICD-10-CM | POA: Diagnosis not present

## 2015-12-23 DIAGNOSIS — F411 Generalized anxiety disorder: Secondary | ICD-10-CM

## 2015-12-23 MED ORDER — HYDROXYZINE HCL 25 MG PO TABS: 25.0000 mg | ORAL_TABLET | Freq: Every evening | ORAL | Status: DC | PRN

## 2015-12-23 MED ORDER — SERTRALINE HCL 100 MG PO TABS: ORAL_TABLET | ORAL | 1 refills | Status: DC

## 2015-12-23 MED ORDER — LAMOTRIGINE 25 MG PO TABS: 25.0000 mg | ORAL_TABLET | Freq: Every day | ORAL | 0 refills | Status: DC

## 2015-12-23 MED ORDER — ALPRAZOLAM 0.25 MG PO TABS: 0.2500 mg | ORAL_TABLET | Freq: Every day | ORAL | 1 refills | Status: DC | PRN

## 2015-12-23 NOTE — Progress Notes (Signed)
Psychiatric MD Progress Note   Patient Identification: Shawn Lam MRN:  [id number] Date of Evaluation:  12/23/2015 Referral Source: Stanton Kidney- Therapist Chief Complaint:   Chief Complaint    Follow-up; Medication Refill     Visit Diagnosis:    ICD-9-CM ICD-10-CM   1. Episodic mood disorder ([place]) 296.90 F39   2. Generalized anxiety disorder 300.02 F41.1     History of Present Illness:    Patient is a 65 year old married male who presented for Follow-up. He reported that he has history of anxiety and panic including anticipatory anxiety. He has been getting medications from her primary care physician Dr. Diona Browner. He reported that the medications are helping him and he has noticed some improvement in his symptoms. He reported that now he is able to sleep well with the help of the Remeron. He reported that he is feeling better when he wakes up in the morning. He stated that he wants his medications to be adjusted that he is still feeling agitated especially during the daytime. His wife has also noticed that he is agitated throughout the day. He has been taking Xanax in the afternoon to control his behavior. Patient currently denied having any suicidal ideations or plans. We discussed about his medications at length.   He takes Remeron and Restoril at bedtime to help him sleep and has been sleeping well at night. He is not experiencing any side effects of medications at this time.   not helping him. He reported that he feels he has been feeling nervous and fidgety all the time. Patient reported that he runs early in the morning approximately 5-8 miles on a daily basis.  He was calm and cooperative during the interview. He currently denied using any drugs at this time. His spends his time at home.   He denied having any perceptual disturbances. He denied having any suicidal homicidal ideations or plans.   Associated Signs/Symptoms: Depression Symptoms:  depressed  mood, insomnia, psychomotor retardation, fatigue, anxiety, loss of energy/fatigue, disturbed sleep, weight loss, decreased appetite, (Hypo) Manic Symptoms:  Labiality of Mood, Anxiety Symptoms:  Excessive Worry, Psychotic Symptoms:  none PTSD Symptoms: Negative NA  Past Psychiatric History:  Pt reported history of anxiety and has been taking medications including Zoloft and Xanax prescribed by his primary care physician. He denied any previous history of psychiatric hospitalization. No history of suicide attempt.  Previous Psychotropic Medications: Xanax 0.5 mg by mouth twice a day Zoloft 100 mg by mouth twice a day   Substance Abuse History in the last 12 months:  Yes.   3 beers/week  Consequences of Substance Abuse: Negative NA  Past Medical History:  Past Medical History:  Diagnosis Date  . Anemia   . Anxiety   . Asymptomatic LV dysfunction    mild with EF 48%  . Berger's disease   . CAD (coronary artery disease) 06/2005   PCI of LAD and RCA  . Depression   . Hematuria   . Hyperlipidemia   . Insomnia   . Left ureteral stone   . Orthostatic hypotension    negative tilt table  . Renal disorder    PT IS SEEING DR. Lorrene Reid- NEPHROLOGIST  . Rosacea   . Urticaria     Past Surgical History:  Procedure Laterality Date  . CARDIAC CATHETERIZATION  09/06/2013   [place]  . CARDIAC CATHETERIZATION    . CORONARY ANGIOPLASTY  2004   STENT PLACEMENT  . CYSTOSCOPY WITH RETROGRADE PYELOGRAM, URETEROSCOPY AND STENT PLACEMENT Left 03/19/2014  Procedure: CYSTOSCOPY WITH RETROGRADE PYELOGRAM, URETEROSCOPY AND STENT PLACEMENT;  Surgeon: Junious Dresser, MD;  Location: [facility];  Service: Urology;  Laterality: Left;  . CYSTOSCOPY WITH RETROGRADE PYELOGRAM, URETEROSCOPY AND STENT PLACEMENT Bilateral 05/20/2015   Procedure:  CYSTOSCOPY WITH BILATERAL RETROGRADE PYELOGRAM,RIGHT  DIAGNOSTIC URETEROSCOPY ,LEFT URETEROSCOPY WITH HOLMIUM LASER  AND BILATERAL  STENT PLACEMENT ;  Surgeon:  Alexis Frock, MD;  Location: [facility];  Service: Urology;  Laterality: Bilateral;  . HOLMIUM LASER APPLICATION Bilateral 08/11/8411   Procedure: HOLMIUM LASER APPLICATION;  Surgeon: Alexis Frock, MD;  Location: [facility];  Service: Urology;  Laterality: Bilateral;  . LEFT HEART CATHETERIZATION WITH CORONARY ANGIOGRAM N/A 09/06/2013   Procedure: LEFT HEART CATHETERIZATION WITH CORONARY ANGIOGRAM;  Surgeon: Sinclair Grooms, MD;  Location: [facility] CATH LAB;  Service: Cardiovascular;  Laterality: N/A;  . LEFT KNEE CAP  SURGERY ABOUT 40 YRS AGO  ? 1970    . STONE EXTRACTION WITH BASKET Left 03/19/2014   Procedure: STONE EXTRACTION WITH BASKET;  Surgeon: Junious Dresser, MD;  Location: [facility];  Service: Urology;  Laterality: Left;    Family Psychiatric History:  Pt denied  Family History:  Family History  Problem Relation Age of Onset  . Coronary artery disease Mother   . Heart attack Mother   . Heart disease Mother   . Hypertension Mother   . Cancer Father     lung and colon  . Lung cancer Father     smoker  . Diabetes Brother     Social History:   Social History   Social History  . Marital status: Married    Spouse name: N/A  . Number of children: N/A  . Years of education: N/A   Occupational History  . retired Print production planner    Social History Main Topics  . Smoking status: Never Smoker  . Smokeless tobacco: Never Used  . Alcohol use 1.2 - 1.8 oz/week    2 - 3 Cans of beer per week  . Drug use: No  . Sexual activity: Yes   Other Topics Concern  . 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  Worked for [employer].   Allergies:   Allergies  Allergen Reactions  . Codeine Nausea Only  . Tape Other (See Comments)    Use only paper tape. Severe blistering and bruising with other tapes.  No cloth tape.    Metabolic Disorder Labs: Lab Results  Component Value Date   HGBA1C 5.6 01/27/2015   No results found for: PROLACTIN Lab Results  Component Value Date   CHOL 176 01/27/2015   TRIG 51.0 01/27/2015   HDL 96.20 01/27/2015   CHOLHDL 2 01/27/2015   VLDL 10.2 01/27/2015   LDLCALC 70 01/27/2015   LDLCALC 61 05/21/2014     Current Medications: Current Outpatient Prescriptions  Medication Sig Dispense Refill  . ALPRAZolam (XANAX) 0.25 MG tablet Take 1 tablet (0.25 mg total) by mouth daily as needed. Pt has supply- filled 11/24/15 #60 30 tablet 1  . aspirin EC 81 MG tablet Take 81 mg by mouth daily.    [name] atorvastatin (LIPITOR) 40 MG tablet TAKE 1 TABLET BY MOUTH DAILY. 90 tablet 2  . clopidogrel (PLAVIX) 75 MG tablet TAKE 1 TABLET (  75 MG TOTAL) BY MOUTH DAILY. 90 tablet 1  . docusate sodium (COLACE) 100 MG capsule Take 1 tablet once or twice daily as needed for constipation while taking narcotic pain medicine 30 capsule 0  . ferrous sulfate 325 (65 FE) MG tablet TAKE 1 TABLET BY MOUTH 3 TIMES DAILY WITH MEALS. 270 tablet 3  . fludrocortisone (FLORINEF) 0.1 MG tablet TAKE 1 TABLET (0.1 MG TOTAL) BY MOUTH DAILY. 90 tablet 3  . FLUOCINOLONE ACETONIDE BODY 0.01 % OIL Apply 1 application topically daily as needed (for dry skin).     . hydrOXYzine (ATARAX/VISTARIL) 25 MG tablet Take 1 tablet (25 mg total) by mouth at bedtime as needed for anxiety. Take 1 pill at bed time- pt has supply 30 tablet   . midodrine (PROAMATINE) 5 MG tablet TAKE 1 TABLET (5 MG TOTAL) BY MOUTH 2 (TWO) TIMES DAILY WITH BREAKFAST AND LUNCH. 180 tablet 0  . mirtazapine (REMERON) 15 MG tablet Take 1 tablet (15 mg total) by mouth at bedtime. 30 tablet 1  . Multiple Vitamins-Minerals (CENTRUM ADULTS PO) Take 1 tablet by mouth every morning.     [name] NITROSTAT 0.4 MG SL tablet PLACE 1 TABLET (0.4 MG TOTAL) UNDER THE TONGUE EVERY 5 (FIVE) MINUTES AS NEEDED FOR CHEST PAIN. 25 tablet 5  . omega-3 acid ethyl esters  (LOVAZA) 1 G capsule Take 2 g by mouth 2 (two) times daily.     . sertraline (ZOLOFT) 100 MG tablet Take 1/2  pill in am- pt has supply 180 tablet 1  . vitamin B-12 (CYANOCOBALAMIN) 1000 MCG tablet Take 1,000 mcg by mouth at bedtime.     . lamoTRIgine (LAMICTAL) 25 MG tablet Take 1 tablet (25 mg total) by mouth daily. 30 tablet 0   No current facility-administered medications for this visit.     Neurologic: Headache: No Seizure: No Paresthesias:No  Musculoskeletal: Strength & Muscle Tone: within normal limits Gait & Station: normal Patient leans: N/A  Psychiatric Specialty Exam: ROS  Blood pressure 120/70, pulse 82, temperature 98.4 F (36.9 C), temperature source Oral, height 6' (1.829 m), weight 153 lb 12.8 oz (69.8 kg).Body mass index is 20.86 kg/m.  General Appearance: Casual  Eye Contact:  Fair  Speech:  Clear and Coherent  Volume:  Normal  Mood:  Anxious  Affect:  Congruent  Thought Process:  Goal Directed  Orientation:  Full (Time, Place, and Person)  Thought Content:  WDL  Suicidal Thoughts:  No  Homicidal Thoughts:  No  Memory:  Immediate;   Fair Recent;   Fair Remote;   Fair  Judgement:  Fair  Insight:  Fair  Psychomotor Activity:  Normal  Concentration:  Concentration: Fair and Attention Span: Fair  Recall:  [employer] of Knowledge:Fair  Language: Fair  Akathisia:  No  Handed:  Right  AIMS (if indicated):    Assets:  Communication Skills Desire for Improvement Physical Health Social Support  ADL's:  Intact  Cognition: WNL  Sleep:  poor    Treatment Plan Summary: Medication management   Discussed with patient at length about the medications treatment risks benefits and alternatives.  Continue  Remeron 15 mg by mouth daily at bedtime to help with his anxiety insomnia and depression. He will continue on Xanax 0.25 mg during lunchtime. He will continue on Zoloft 50 mg in the morning. I will start him on lamotrigine 25 mg by mouth daily. Discussed  with him about the side effects including the risk of rash and Katherina Right  syndrome and he demonstrated understanding. I'll also decrease hydroxyzine 25 mg at bedtime    Follow-up in 4 weeks or earlier depending on his symptoms   More than 50% of the time spent in psychoeducation, counseling and coordination of care.    This note was generated in part or whole with voice recognition software. Voice regonition is usually quite accurate but there are transcription errors that can and very often do occur. I apologize for any typographical errors that were not detected and corrected.   Rainey Pines, MD 9/13/20174:23 PM

## 2016-01-04 ENCOUNTER — Other Ambulatory Visit: Payer: Self-pay | Admitting: Psychiatry

## 2016-01-12 ENCOUNTER — Encounter: Payer: Self-pay | Admitting: Cardiology

## 2016-01-15 ENCOUNTER — Ambulatory Visit (INDEPENDENT_AMBULATORY_CARE_PROVIDER_SITE_OTHER): Payer: Medicare Other | Admitting: Psychiatry

## 2016-01-15 ENCOUNTER — Encounter: Payer: Self-pay | Admitting: Psychiatry

## 2016-01-15 VITALS — BP 115/69 | HR 64 | Temp 98.4°F | Wt 157.8 lb

## 2016-01-15 DIAGNOSIS — F39 Unspecified mood [affective] disorder: Secondary | ICD-10-CM | POA: Diagnosis not present

## 2016-01-15 DIAGNOSIS — F411 Generalized anxiety disorder: Secondary | ICD-10-CM | POA: Diagnosis not present

## 2016-01-15 MED ORDER — ALPRAZOLAM 0.25 MG PO TABS: 0.2500 mg | ORAL_TABLET | Freq: Every day | ORAL | 1 refills | Status: DC | PRN

## 2016-01-15 MED ORDER — MIRTAZAPINE 15 MG PO TABS: 15.0000 mg | ORAL_TABLET | Freq: Every day | ORAL | 1 refills | Status: DC

## 2016-01-15 MED ORDER — LITHIUM CARBONATE 150 MG PO CAPS: 150.0000 mg | ORAL_CAPSULE | Freq: Two times a day (BID) | ORAL | 1 refills | Status: DC

## 2016-01-15 NOTE — Progress Notes (Signed)
Psychiatric MD Progress Note   Patient Identification: Shawn Lam MRN:  [id number] Date of Evaluation:  01/15/2016 Referral Source: Stanton Kidney- Therapist Chief Complaint:   Chief Complaint    Follow-up; Medication Refill     Visit Diagnosis:    ICD-9-CM ICD-10-CM   1. Episodic mood disorder ([place]) 296.90 F39   2. Generalized anxiety disorder 300.02 F41.1     History of Present Illness:    Patient is a 65 year old married male who presented for follow-up. He reported that he has history of anxiety and panic including anticipatory anxiety. She reported that he continues to feel anxious. He has not noticed an improvement in his symptoms since the medications are adjusted last time. He was noted to be shaking his legs during the interview. He reported that he has been taking hydroxyzine on a regular basis and wants to stop taking the medication as it has not been helpful. He feels tired during the daytime as well. He wakes up early in the morning. He currently denied having any suicidal ideations or plans. He reported that he feels angry quickly and his wife has also noticed that he gets agitated throughout the day.   Denied having any side effects of the medication. He does not use any energy drinks. He only drinks 2 cups of caffeine during the daytime.   Patient currently denied having any suicidal ideations or plans. We discussed about his medications at length.     Associated Signs/Symptoms: Depression Symptoms:  depressed mood, insomnia, fatigue, anxiety, decreased appetite, (Hypo) Manic Symptoms:  Labiality of Mood, Anxiety Symptoms:  Excessive Worry, Psychotic Symptoms:  none PTSD Symptoms: Negative NA  Past Psychiatric History:  Pt reported history of anxiety and has been taking medications including Zoloft and Xanax prescribed by his primary care physician. He denied any previous history of psychiatric hospitalization. No history of suicide attempt.  Previous Psychotropic  Medications: Xanax 0.5 mg by mouth twice a day Zoloft 100 mg by mouth twice a day   Substance Abuse History in the last 12 months:  Yes.   3 beers/week  Consequences of Substance Abuse: Negative NA  Past Medical History:  Past Medical History:  Diagnosis Date  . Anemia   . Anxiety   . Asymptomatic LV dysfunction    mild with EF 48%  . Berger's disease   . CAD (coronary artery disease) 06/2005   PCI of LAD and RCA  . Depression   . Hematuria   . Hyperlipidemia   . Insomnia   . Left ureteral stone   . Orthostatic hypotension    negative tilt table  . Renal disorder    PT IS SEEING DR. Lorrene Reid- NEPHROLOGIST  . Rosacea   . Urticaria     Past Surgical History:  Procedure Laterality Date  . CARDIAC CATHETERIZATION  09/06/2013   [place]  . CARDIAC CATHETERIZATION    . CORONARY ANGIOPLASTY  2004   STENT PLACEMENT  . CYSTOSCOPY WITH RETROGRADE PYELOGRAM, URETEROSCOPY AND STENT PLACEMENT Left 03/19/2014   Procedure: CYSTOSCOPY WITH RETROGRADE PYELOGRAM, URETEROSCOPY AND STENT PLACEMENT;  Surgeon: Junious Dresser, MD;  Location: [facility];  Service: Urology;  Laterality: Left;  . CYSTOSCOPY WITH RETROGRADE PYELOGRAM, URETEROSCOPY AND STENT PLACEMENT Bilateral 05/20/2015   Procedure:  CYSTOSCOPY WITH BILATERAL RETROGRADE PYELOGRAM,RIGHT  DIAGNOSTIC URETEROSCOPY ,LEFT URETEROSCOPY WITH HOLMIUM LASER  AND BILATERAL  STENT PLACEMENT ;  Surgeon: Alexis Frock, MD;  Location: [facility];  Service: Urology;  Laterality: Bilateral;  . HOLMIUM LASER APPLICATION Bilateral 05/20/9240  Procedure: HOLMIUM LASER APPLICATION;  Surgeon: Alexis Frock, MD;  Location: [facility];  Service: Urology;  Laterality: Bilateral;  . LEFT HEART CATHETERIZATION WITH CORONARY ANGIOGRAM N/A 09/06/2013   Procedure: LEFT HEART CATHETERIZATION WITH CORONARY ANGIOGRAM;  Surgeon: Sinclair Grooms, MD;  Location: [facility];  Service: Cardiovascular;  Laterality: N/A;  . LEFT KNEE CAP  SURGERY ABOUT 40 YRS AGO  ? 1970    .  STONE EXTRACTION WITH BASKET Left 03/19/2014   Procedure: STONE EXTRACTION WITH BASKET;  Surgeon: Junious Dresser, MD;  Location: [facility];  Service: Urology;  Laterality: Left;    Family Psychiatric History:  Pt denied  Family History:  Family History  Problem Relation Age of Onset  . Coronary artery disease Mother   . Heart attack Mother   . Heart disease Mother   . Hypertension Mother   . Cancer Father     lung and colon  . Lung cancer Father     smoker  . Diabetes Brother     Social History:   Social History   Social History  . Marital status: Married    Spouse name: N/A  . Number of children: N/A  . Years of education: N/A   Occupational History  . retired Print production planner    Social History Main Topics  . Smoking status: Never Smoker  . Smokeless tobacco: Never Used  . Alcohol use 1.2 - 1.8 oz/week    2 - 3 Cans of beer per week  . Drug use: No  . Sexual activity: Yes   Other Topics Concern  . 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  Worked for [employer].   Allergies:   Allergies  Allergen Reactions  . Codeine Nausea Only  . Tape Other (See Comments)    Use only paper tape. Severe blistering and bruising with other tapes. No cloth tape.    Metabolic Disorder Labs: Lab Results  Component Value Date   HGBA1C 5.6 01/27/2015   No results found for: PROLACTIN Lab Results  Component Value Date   CHOL 176 01/27/2015   TRIG 51.0 01/27/2015   HDL 96.20 01/27/2015   CHOLHDL 2 01/27/2015   VLDL 10.2 01/27/2015   LDLCALC 70 01/27/2015   LDLCALC 61 05/21/2014     Current Medications: Current Outpatient Prescriptions  Medication Sig Dispense Refill  . ALPRAZolam (XANAX) 0.25 MG tablet Take 1 tablet (0.25 mg total) by mouth daily as needed. Pt has supply-  filled 11/24/15 #60 30 tablet 1  . aspirin EC 81 MG tablet Take 81 mg by mouth daily.    [name] atorvastatin (LIPITOR) 40 MG tablet TAKE 1 TABLET BY MOUTH DAILY. 90 tablet 2  . clopidogrel (PLAVIX) 75 MG tablet TAKE 1 TABLET (75 MG TOTAL) BY MOUTH DAILY. 90 tablet 1  . docusate sodium (COLACE) 100 MG capsule Take 1 tablet once or twice daily as needed for constipation while taking narcotic pain medicine 30 capsule 0  . ferrous sulfate 325 (65 FE) MG tablet TAKE 1 TABLET BY MOUTH 3 TIMES DAILY WITH MEALS. 270 tablet 3  . fludrocortisone (FLORINEF) 0.1 MG tablet TAKE 1 TABLET (0.1 MG TOTAL) BY MOUTH DAILY. 90 tablet 3  . FLUOCINOLONE ACETONIDE BODY 0.01 % OIL Apply  1 application topically daily as needed (for dry skin).     . midodrine (PROAMATINE) 5 MG tablet TAKE 1 TABLET (5 MG TOTAL) BY MOUTH 2 (TWO) TIMES DAILY WITH BREAKFAST AND LUNCH. 180 tablet 0  . mirtazapine (REMERON) 15 MG tablet Take 1 tablet (15 mg total) by mouth at bedtime. 30 tablet 1  . Multiple Vitamins-Minerals (CENTRUM ADULTS PO) Take 1 tablet by mouth every morning.     [name] NITROSTAT 0.4 MG SL tablet PLACE 1 TABLET (0.4 MG TOTAL) UNDER THE TONGUE EVERY 5 (FIVE) MINUTES AS NEEDED FOR CHEST PAIN. 25 tablet 5  . omega-3 acid ethyl esters (LOVAZA) 1 G capsule Take 2 g by mouth 2 (two) times daily.     . vitamin B-12 (CYANOCOBALAMIN) 1000 MCG tablet Take 1,000 mcg by mouth at bedtime.     [name] lithium carbonate 150 MG capsule Take 1 capsule (150 mg total) by mouth 2 (two) times daily with a meal. Start 1 pill daily x 2 days then BID 60 capsule 1   No current facility-administered medications for this visit.     Neurologic: Headache: No Seizure: No Paresthesias:No  Musculoskeletal: Strength & Muscle Tone: within normal limits Gait & Station: normal Patient leans: N/A  Psychiatric Specialty Exam: ROS  Blood pressure 115/69, pulse 64, temperature 98.4 F (36.9 C), temperature source Oral, weight 157 lb 12.8 oz (71.6 kg).Body mass  index is 21.4 kg/m.  General Appearance: Casual  Eye Contact:  Fair  Speech:  Clear and Coherent  Volume:  Normal  Mood:  Anxious  Affect:  Congruent  Thought Process:  Goal Directed  Orientation:  Full (Time, Place, and Person)  Thought Content:  WDL  Suicidal Thoughts:  No  Homicidal Thoughts:  No  Memory:  Immediate;   Fair Recent;   Fair Remote;   Fair  Judgement:  Fair  Insight:  Fair  Psychomotor Activity:  Normal  Concentration:  Concentration: Fair and Attention Span: Fair  Recall:  [employer] of Knowledge:Fair  Language: Fair  Akathisia:  No  Handed:  Right  AIMS (if indicated):    Assets:  Communication Skills Desire for Improvement Physical Health Social Support  ADL's:  Intact  Cognition: WNL  Sleep:  poor    Treatment Plan Summary: Medication management   Discussed with patient at length about the medications treatment risks benefits and alternatives.  Continue  Remeron 15 mg by mouth daily at bedtime to help with his anxiety insomnia and depression. He will continue on Xanax 0.25 mg during lunchtime. DC lamotrigine hydroxyzine and sertraline at this time. I will start him on lithium carbonate 150 mg and will advise him to start taking 1 pill daily for 2 days and then titrate to 150 twice a day. He agreed with the plan.     Follow-up in 2 weeks or earlier depending on his symptoms   More than 50% of the time spent in psychoeducation, counseling and coordination of care.    This note was generated in part or whole with voice recognition software. Voice regonition is usually quite accurate but there are transcription errors that can and very often do occur. I apologize for any typographical errors that were not detected and corrected.   Rainey Pines, MD 10/6/201711:31 AM

## 2016-01-18 ENCOUNTER — Other Ambulatory Visit: Payer: Self-pay | Admitting: Family Medicine

## 2016-01-18 ENCOUNTER — Other Ambulatory Visit: Payer: Self-pay | Admitting: Cardiology

## 2016-01-20 ENCOUNTER — Ambulatory Visit: Payer: Medicare Other | Admitting: Psychiatry

## 2016-01-22 ENCOUNTER — Other Ambulatory Visit: Payer: Self-pay | Admitting: Cardiology

## 2016-01-22 NOTE — Telephone Encounter (Signed)
Medication Detail    Disp Refills Start End   clopidogrel (PLAVIX) 75 MG tablet 90 tablet 1 08/11/2015    Sig: TAKE 1 TABLET (75 MG TOTAL) BY MOUTH DAILY.   E-Prescribing Status: Receipt confirmed by pharmacy (08/11/2015 10:04 AM EDT)   Pharmacy   [id number] - [place], [place]

## 2016-01-28 ENCOUNTER — Other Ambulatory Visit: Payer: Self-pay | Admitting: Family Medicine

## 2016-01-28 NOTE — Telephone Encounter (Signed)
Pt is now seeing Dr. Clancy Gourd, psychiatrist.. Psychiatric meds need to be refilled through him/her.

## 2016-01-28 NOTE — Telephone Encounter (Signed)
Last office visit 02/03/2015.  Sertraline not on current medication list.  Ok to refill?

## 2016-01-29 ENCOUNTER — Encounter: Payer: Self-pay | Admitting: Psychiatry

## 2016-01-29 ENCOUNTER — Ambulatory Visit (INDEPENDENT_AMBULATORY_CARE_PROVIDER_SITE_OTHER): Payer: Medicare Other | Admitting: Psychiatry

## 2016-01-29 ENCOUNTER — Other Ambulatory Visit: Payer: Self-pay | Admitting: Psychiatry

## 2016-01-29 VITALS — BP 104/64 | HR 57 | Temp 97.8°F | Wt 158.2 lb

## 2016-01-29 DIAGNOSIS — F411 Generalized anxiety disorder: Secondary | ICD-10-CM | POA: Diagnosis not present

## 2016-01-29 DIAGNOSIS — F39 Unspecified mood [affective] disorder: Secondary | ICD-10-CM

## 2016-01-29 MED ORDER — FLUOXETINE HCL 20 MG PO TABS: 20.0000 mg | ORAL_TABLET | Freq: Every morning | ORAL | 1 refills | Status: DC

## 2016-01-29 MED ORDER — ARIPIPRAZOLE 2 MG PO TABS: 2.0000 mg | ORAL_TABLET | Freq: Every day | ORAL | 0 refills | Status: DC

## 2016-01-29 NOTE — Progress Notes (Signed)
Psychiatric MD Progress Note   Patient Identification: Shawn Lam MRN:  [id number] Date of Evaluation:  01/29/2016 Referral Source: Stanton Kidney- Therapist Chief Complaint:   Chief Complaint    Follow-up; Medication Refill     Visit Diagnosis:    ICD-9-CM ICD-10-CM   1. Episodic mood disorder ([place]) 296.90 F39   2. Generalized anxiety disorder 300.02 F41.1     History of Present Illness:    Patient is a 65 year old married male who presented for follow-up. He reported that he has history of anxiety and panic including anticipatory anxiety. Pt  reported that he continues to feel Depressed and has lack of energy. He reported that he did not improve on the lithium which was started at his last appointment. He has no energy and the medication is not helping. He stated that he continues to show impulsive behavior. We discussed about his medications. He has stopped all the medications and is only taking Remeron and Xanax at bedtime. He reported that the medications are not helping him. He denied having any suicidal ideations or plans. He is calm and receptive during the interview. We discussed about adjusting his medications. He denied having any suicidal ideations or plans. He does not use any drugs or alcohol at this time.  He does not use any energy drinks. He only drinks 2 cups of caffeine during the daytime.      Associated Signs/Symptoms: Depression Symptoms:  depressed mood, insomnia, fatigue, anxiety, decreased appetite, (Hypo) Manic Symptoms:  Labiality of Mood, Anxiety Symptoms:  Excessive Worry, Psychotic Symptoms:  none PTSD Symptoms: Negative NA  Past Psychiatric History:  Pt reported history of anxiety and has been taking medications including Zoloft and Xanax prescribed by his primary care physician. He denied any previous history of psychiatric hospitalization. No history of suicide attempt.  Previous Psychotropic Medications: Xanax 0.5 mg by mouth twice a  day Zoloft 100 mg by mouth twice a day   Substance Abuse History in the last 12 months:  Yes.   3 beers/week  Consequences of Substance Abuse: Negative NA  Past Medical History:  Past Medical History:  Diagnosis Date  . Anemia   . Anxiety   . Asymptomatic LV dysfunction    mild with EF 48%  . Berger's disease   . CAD (coronary artery disease) 06/2005   PCI of LAD and RCA  . Depression   . Hematuria   . Hyperlipidemia   . Insomnia   . Left ureteral stone   . Orthostatic hypotension    negative tilt table  . Renal disorder    PT IS SEEING DR. Lorrene Reid- NEPHROLOGIST  . Rosacea   . Urticaria     Past Surgical History:  Procedure Laterality Date  . CARDIAC CATHETERIZATION  09/06/2013   [place]  . CARDIAC CATHETERIZATION    . CORONARY ANGIOPLASTY  2004   STENT PLACEMENT  . CYSTOSCOPY WITH RETROGRADE PYELOGRAM, URETEROSCOPY AND STENT PLACEMENT Left 03/19/2014   Procedure: CYSTOSCOPY WITH RETROGRADE PYELOGRAM, URETEROSCOPY AND STENT PLACEMENT;  Surgeon: Junious Dresser, MD;  Location: [facility];  Service: Urology;  Laterality: Left;  . CYSTOSCOPY WITH RETROGRADE PYELOGRAM, URETEROSCOPY AND STENT PLACEMENT Bilateral 05/20/2015   Procedure:  CYSTOSCOPY WITH BILATERAL RETROGRADE PYELOGRAM,RIGHT  DIAGNOSTIC URETEROSCOPY ,LEFT URETEROSCOPY WITH HOLMIUM LASER  AND BILATERAL  STENT PLACEMENT ;  Surgeon: Alexis Frock, MD;  Location: [facility];  Service: Urology;  Laterality: Bilateral;  . HOLMIUM LASER APPLICATION Bilateral 04/18/4079   Procedure: HOLMIUM LASER APPLICATION;  Surgeon: Alexis Frock,  MD;  Location: [facility];  Service: Urology;  Laterality: Bilateral;  . LEFT HEART CATHETERIZATION WITH CORONARY ANGIOGRAM N/A 09/06/2013   Procedure: LEFT HEART CATHETERIZATION WITH CORONARY ANGIOGRAM;  Surgeon: Sinclair Grooms, MD;  Location: [facility];  Service: Cardiovascular;  Laterality: N/A;  . LEFT KNEE CAP  SURGERY ABOUT 40 YRS AGO  ? 1970    . STONE EXTRACTION WITH BASKET Left 03/19/2014    Procedure: STONE EXTRACTION WITH BASKET;  Surgeon: Junious Dresser, MD;  Location: [facility];  Service: Urology;  Laterality: Left;    Family Psychiatric History:  Pt denied  Family History:  Family History  Problem Relation Age of Onset  . Coronary artery disease Mother   . Heart attack Mother   . Heart disease Mother   . Hypertension Mother   . Cancer Father     lung and colon  . Lung cancer Father     smoker  . Diabetes Brother     Social History:   Social History   Social History  . Marital status: Married    Spouse name: N/A  . Number of children: N/A  . Years of education: N/A   Occupational History  . retired Print production planner    Social History Main Topics  . Smoking status: Never Smoker  . Smokeless tobacco: Never Used  . Alcohol use 1.2 - 1.8 oz/week    2 - 3 Cans of beer per week  . Drug use: No  . Sexual activity: Yes   Other Topics Concern  . 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  Worked for [employer].   Allergies:   Allergies  Allergen Reactions  . Codeine Nausea Only  . Tape Other (See Comments)    Use only paper tape. Severe blistering and bruising with other tapes. No cloth tape.    Metabolic Disorder Labs: Lab Results  Component Value Date   HGBA1C 5.6 01/27/2015   No results found for: PROLACTIN Lab Results  Component Value Date   CHOL 176 01/27/2015   TRIG 51.0 01/27/2015   HDL 96.20 01/27/2015   CHOLHDL 2 01/27/2015   VLDL 10.2 01/27/2015   LDLCALC 70 01/27/2015   LDLCALC 61 05/21/2014     Current Medications: Current Outpatient Prescriptions  Medication Sig Dispense Refill  . ALPRAZolam (XANAX) 0.25 MG tablet Take 1 tablet (0.25 mg total) by mouth daily as needed. Pt has supply- filled 11/24/15 #60 30 tablet 1  . aspirin EC 81 MG  tablet Take 81 mg by mouth daily.    [name] atorvastatin (LIPITOR) 40 MG tablet TAKE 1 TABLET BY MOUTH DAILY. 90 tablet 0  . clopidogrel (PLAVIX) 75 MG tablet TAKE 1 TABLET (75 MG TOTAL) BY MOUTH DAILY. 90 tablet 1  . docusate sodium (COLACE) 100 MG capsule Take 1 tablet once or twice daily as needed for constipation while taking narcotic pain medicine 30 capsule 0  . ferrous sulfate 325 (65 FE) MG tablet TAKE 1 TABLET BY MOUTH 3 TIMES DAILY WITH MEALS. 270 tablet 3  . fludrocortisone (FLORINEF) 0.1 MG tablet TAKE 1 TABLET (0.1 MG TOTAL) BY MOUTH DAILY. 90 tablet 3  . FLUOCINOLONE ACETONIDE BODY 0.01 % OIL Apply 1 application topically daily as needed (for dry  skin).     . midodrine (PROAMATINE) 5 MG tablet TAKE 1 TABLET (5 MG TOTAL) BY MOUTH 2 (TWO) TIMES DAILY WITH BREAKFAST AND LUNCH. 180 tablet 0  . mirtazapine (REMERON) 15 MG tablet Take 1 tablet (15 mg total) by mouth at bedtime. 30 tablet 1  . Multiple Vitamins-Minerals (CENTRUM ADULTS PO) Take 1 tablet by mouth every morning.     [name] NITROSTAT 0.4 MG SL tablet PLACE 1 TABLET (0.4 MG TOTAL) UNDER THE TONGUE EVERY 5 (FIVE) MINUTES AS NEEDED FOR CHEST PAIN. 25 tablet 5  . omega-3 acid ethyl esters (LOVAZA) 1 G capsule Take 2 g by mouth 2 (two) times daily.     . vitamin B-12 (CYANOCOBALAMIN) 1000 MCG tablet Take 1,000 mcg by mouth at bedtime.     . ARIPiprazole (ABILIFY) 2 MG tablet Take 1 tablet (2 mg total) by mouth daily. 30 tablet 0   No current facility-administered medications for this visit.     Neurologic: Headache: No Seizure: No Paresthesias:No  Musculoskeletal: Strength & Muscle Tone: within normal limits Gait & Station: normal Patient leans: N/A  Psychiatric Specialty Exam: ROS  Blood pressure 104/64, pulse (!) 57, temperature 97.8 F (36.6 C), temperature source Oral, weight 158 lb 3.2 oz (71.8 kg).Body mass index is 21.46 kg/m.  General Appearance: Casual  Eye Contact:  Fair  Speech:  Clear and Coherent  Volume:   Normal  Mood:  Anxious  Affect:  Congruent  Thought Process:  Goal Directed  Orientation:  Full (Time, Place, and Person)  Thought Content:  WDL  Suicidal Thoughts:  No  Homicidal Thoughts:  No  Memory:  Immediate;   Fair Recent;   Fair Remote;   Fair  Judgement:  Fair  Insight:  Fair  Psychomotor Activity:  Normal  Concentration:  Concentration: Fair and Attention Span: Fair  Recall:  [employer] of Knowledge:Fair  Language: Fair  Akathisia:  No  Handed:  Right  AIMS (if indicated):    Assets:  Communication Skills Desire for Improvement Physical Health Social Support  ADL's:  Intact  Cognition: WNL  Sleep:  poor    Treatment Plan Summary: Medication management   Discussed with patient at length about the medications treatment risks benefits and alternatives.  Continue  Remeron 15 mg by mouth daily at bedtime to help with his anxiety insomnia and depression. He will continue on Xanax 0.25 mg during lunchtime. D/c lithium.  Patient will be started on Abilify 2 mg daily and advised him to titrate the dose to 2 pills if he does not show improvement in one week and he agreed with the plan.   Follow-up in 2 weeks or earlier depending on his symptoms   More than 50% of the time spent in psychoeducation, counseling and coordination of care.    This note was generated in part or whole with voice recognition software. Voice regonition is usually quite accurate but there are transcription errors that can and very often do occur. I apologize for any typographical errors that were not detected and corrected.   Rainey Pines, MD 10/20/201710:45 AM

## 2016-01-31 ENCOUNTER — Telehealth: Payer: Self-pay | Admitting: Family Medicine

## 2016-01-31 DIAGNOSIS — D509 Iron deficiency anemia, unspecified: Secondary | ICD-10-CM

## 2016-01-31 DIAGNOSIS — R7303 Prediabetes: Secondary | ICD-10-CM

## 2016-01-31 DIAGNOSIS — E782 Mixed hyperlipidemia: Secondary | ICD-10-CM

## 2016-01-31 DIAGNOSIS — Z125 Encounter for screening for malignant neoplasm of prostate: Secondary | ICD-10-CM

## 2016-01-31 NOTE — Telephone Encounter (Signed)
-----   Message from Marchia Bond sent at 01/26/2016  1:19 PM EDT ----- Regarding: Cpx labs Mon 10/23, need orders. Thanks! :-) Please order  future cpx labs for pt's upcoming lab appt. Thanks Aniceto Boss

## 2016-02-01 ENCOUNTER — Other Ambulatory Visit: Payer: Self-pay | Admitting: Cardiology

## 2016-02-02 ENCOUNTER — Other Ambulatory Visit (INDEPENDENT_AMBULATORY_CARE_PROVIDER_SITE_OTHER): Payer: Medicare Other

## 2016-02-02 DIAGNOSIS — R7303 Prediabetes: Secondary | ICD-10-CM | POA: Diagnosis not present

## 2016-02-02 DIAGNOSIS — E782 Mixed hyperlipidemia: Secondary | ICD-10-CM

## 2016-02-02 DIAGNOSIS — D509 Iron deficiency anemia, unspecified: Secondary | ICD-10-CM | POA: Diagnosis not present

## 2016-02-02 DIAGNOSIS — Z125 Encounter for screening for malignant neoplasm of prostate: Secondary | ICD-10-CM

## 2016-02-02 LAB — COMPREHENSIVE METABOLIC PANEL
ALBUMIN: 4 g/dL (ref 3.5–5.2)
ALK PHOS: 48 U/L (ref 39–117)
ALT: 23 U/L (ref 0–53)
AST: 36 U/L (ref 0–37)
BILIRUBIN TOTAL: 0.5 mg/dL (ref 0.2–1.2)
BUN: 24 mg/dL — AB (ref 6–23)
CO2: 27 mEq/L (ref 19–32)
Calcium: 9.4 mg/dL (ref 8.4–10.5)
Chloride: 110 mEq/L (ref 96–112)
Creatinine, Ser: 1.47 mg/dL (ref 0.40–1.50)
GFR: 51 mL/min — AB (ref >60.00)
GLUCOSE: 104 mg/dL — AB (ref 70–99)
POTASSIUM: 5 meq/L (ref 3.5–5.1)
Sodium: 144 mEq/L (ref 135–145)
TOTAL PROTEIN: 5.8 g/dL — AB (ref 6.0–8.3)

## 2016-02-02 LAB — CBC WITH DIFFERENTIAL/PLATELET
BASOS PCT: 0.8 % (ref 0.0–3.0)
Basophils Absolute: 0.1 10*3/uL (ref 0.0–0.1)
Eosinophils Absolute: 1.1 10*3/uL — ABNORMAL HIGH (ref 0.0–0.7)
HEMATOCRIT: 35.5 % — AB (ref 39.0–52.0)
HEMOGLOBIN: 12 g/dL — AB (ref 13.0–17.0)
LYMPHS PCT: 19.1 % (ref 12.0–46.0)
Lymphs Abs: 1.2 10*3/uL (ref 0.7–4.0)
MCHC: 33.7 g/dL (ref 30.0–36.0)
MCV: 97.3 fl (ref 78.0–100.0)
MONO ABS: 0.4 10*3/uL (ref 0.1–1.0)
MONOS PCT: 5.6 % (ref 3.0–12.0)
Neutro Abs: 3.7 10*3/uL (ref 1.4–7.7)
Neutrophils Relative %: 57.8 % (ref 43.0–77.0)
Platelets: 160 10*3/uL (ref 150.0–400.0)
RBC: 3.65 Mil/uL — AB (ref 4.22–5.81)
RDW: 15.1 % (ref 11.5–15.5)
WBC: 6.4 10*3/uL (ref 4.0–10.5)

## 2016-02-02 LAB — HEMOGLOBIN A1C: HEMOGLOBIN A1C: 5.5 % (ref 4.6–6.5)

## 2016-02-02 LAB — LIPID PANEL
Cholesterol: 156 mg/dL (ref 0–200)
HDL: 77 mg/dL (ref >39.00)
LDL Cholesterol: 71 mg/dL (ref 0–99)
NONHDL: 79.32
Total CHOL/HDL Ratio: 2
Triglycerides: 43 mg/dL (ref 0.0–149.0)
VLDL: 8.6 mg/dL (ref 0.0–40.0)

## 2016-02-02 LAB — PSA, MEDICARE: PSA: 0.83 ng/mL (ref 0.10–4.00)

## 2016-02-04 DIAGNOSIS — L821 Other seborrheic keratosis: Secondary | ICD-10-CM | POA: Diagnosis not present

## 2016-02-04 DIAGNOSIS — L812 Freckles: Secondary | ICD-10-CM | POA: Diagnosis not present

## 2016-02-04 DIAGNOSIS — L570 Actinic keratosis: Secondary | ICD-10-CM | POA: Diagnosis not present

## 2016-02-04 DIAGNOSIS — D692 Other nonthrombocytopenic purpura: Secondary | ICD-10-CM | POA: Diagnosis not present

## 2016-02-04 DIAGNOSIS — L718 Other rosacea: Secondary | ICD-10-CM | POA: Diagnosis not present

## 2016-02-04 DIAGNOSIS — L820 Inflamed seborrheic keratosis: Secondary | ICD-10-CM | POA: Diagnosis not present

## 2016-02-04 DIAGNOSIS — Z1283 Encounter for screening for malignant neoplasm of skin: Secondary | ICD-10-CM | POA: Diagnosis not present

## 2016-02-04 DIAGNOSIS — Z85828 Personal history of other malignant neoplasm of skin: Secondary | ICD-10-CM | POA: Diagnosis not present

## 2016-02-09 ENCOUNTER — Ambulatory Visit (INDEPENDENT_AMBULATORY_CARE_PROVIDER_SITE_OTHER): Payer: Medicare Other | Admitting: Family Medicine

## 2016-02-09 ENCOUNTER — Encounter: Payer: Self-pay | Admitting: Family Medicine

## 2016-02-09 ENCOUNTER — Telehealth: Payer: Self-pay | Admitting: Family Medicine

## 2016-02-09 VITALS — BP 110/54 | HR 64 | Temp 97.7°F | Ht 70.5 in | Wt 155.5 lb

## 2016-02-09 DIAGNOSIS — D539 Nutritional anemia, unspecified: Secondary | ICD-10-CM

## 2016-02-09 DIAGNOSIS — R7303 Prediabetes: Secondary | ICD-10-CM

## 2016-02-09 DIAGNOSIS — Z0000 Encounter for general adult medical examination without abnormal findings: Secondary | ICD-10-CM

## 2016-02-09 DIAGNOSIS — Z23 Encounter for immunization: Secondary | ICD-10-CM | POA: Diagnosis not present

## 2016-02-09 DIAGNOSIS — R079 Chest pain, unspecified: Secondary | ICD-10-CM

## 2016-02-09 DIAGNOSIS — F411 Generalized anxiety disorder: Secondary | ICD-10-CM

## 2016-02-09 DIAGNOSIS — E782 Mixed hyperlipidemia: Secondary | ICD-10-CM

## 2016-02-09 NOTE — Assessment & Plan Note (Addendum)
Felt in past due to hematuria ( was very low in 2014), slight decrease in last 9 months. Had improved on iron supplement. ? Secondary to CKD as well.  Re-eval in 4 weeks with iron panel and B12 given  MCV now increased

## 2016-02-09 NOTE — Patient Instructions (Addendum)
Continue iron.  Return in 4 week for iron studies and cbc.  I will call you once I hear from Dr. Radford Pax about need for further eval or moving appt earlier.

## 2016-02-09 NOTE — Assessment & Plan Note (Signed)
Stable control. 

## 2016-02-09 NOTE — Assessment & Plan Note (Signed)
Pt high risk.. Symptoms concerning for angina.  EKG stable. Will discuss with his cardiologist Dr. Radford Pax.

## 2016-02-09 NOTE — Assessment & Plan Note (Signed)
Well controlled. Continue current medication.  

## 2016-02-09 NOTE — Progress Notes (Signed)
The patient is here for wellcome to medicare..   Anxiety:   On prozac. Followed by psychiatry at this time. Has follow up tommorow.  ? IgA nephropathy: Followed Dr. Lorrene Reid. Slight decrease in cr, GFR decreased but better than 11/2015.  Elevated Cholesterol: At goal <70on  only 1/2 dose ( 40 mg) lipitor.  Lab Results  Component Value Date   CHOL 156 02/02/2016   HDL 77.00 02/02/2016   LDLCALC 71 02/02/2016   LDLDIRECT 57 06/22/2009   TRIG 43.0 02/02/2016   CHOLHDL 2 02/02/2016  Using medications without problems:None  Muscle aches: None  Diet compliance: Good.  Exercise: Jogging daily 9 miles Other complaints: CAD   CAD, hx of 2 stents in 2006..  08/2013 underwent cardiac cath for exertional chest pain that continued despite normal myocardial perfusion scan. Cath showed widely patent coronary arteries including proximal LAD and mid RCA stents with 25-30% instent restenosis of the distal margin of the LAD stent and normal LVF.  Last saw Dr. Radford Pax 07/2014, also has follow up this week with her. ECHO 2014:decreased EF. Had cardiac MRI: looked okay per pt. Also follows heart flutters, orthostatic intolerance ( on florinef and midodrine).  He has noted some occ chest pain when running, ongoing x 6 months,  not worsening, occurring 3 times a week when jogging. Last 5 min and goes away at rest.. No associated shortness of breath, no dizziness. Feels well overall.  Has upcoming OV with Dr. Radford Pax NOV 30.  BP doing better on florinef and midodrine. BP Readings from Last 3 Encounters:  02/09/16 (!) 110/54  01/29/16 104/64  01/15/16 115/69   Prediabetes:  Stable Lab Results  Component Value Date   HGBA1C 5.5 02/02/2016   LFTs : slight increase on 40 mg  Lipitor in past BUT NOW NML, but not as high as on 80 mg daily  Neg hepatitis screen.  Wt Readings from Last 3 Encounters:  02/09/16 155 lb 8 oz (70.5 kg)  01/29/16 158 lb 3.2 oz (71.8 kg)  01/15/16 157 lb 12.8 oz (71.6  kg)  Body mass index is 22 kg/m.  Microcytic hematuria: likely cause of anemia, neg uro work up. Last epidose 1 month ago.  No blood in stool. Using iron 325 mg daily.  Social History /Family History/Past Medical History reviewed and updated if needed.  Review of Systems  Constitutional: Negative for fever, fatigue and unexpected weight change.  HENT: Negative for ear pain, congestion, sore throat, rhinorrhea, trouble swallowing and postnasal drip.  Eyes: Negative for pain.  Respiratory: Negative for cough, shortness of breath and wheezing.  Cardiovascular: Negative for chest pain, palpitations and leg swelling.  Gastrointestinal: Negative for nausea, abdominal pain, diarrhea, constipation and blood in stool.  Genitourinary: Negative for dysuria, urgency, hematuria, discharge, penile swelling, scrotal swelling, difficulty urinating, penile pain and testicular pain.  Skin: Negative for rash.  Neurological: Negative for syncope, weakness, light-headedness, numbness and headaches.  Psychiatric/Behavioral: Negative for behavioral problems and dysphoric mood. The patient is nervous/anxious.  Objective:   Physical Exam  Constitutional: He appears well-developed and well-nourished. Non-toxic appearance. He does not appear ill. No distress.  HENT:  Head: Normocephalic and atraumatic.  Right Ear: Hearing, tympanic membrane, external ear and ear canal normal.  Left Ear: Hearing, tympanic membrane, external ear and ear canal normal.  Nose: Nose normal.  Mouth/Throat: Uvula is midline, oropharynx is clear and moist and mucous membranes are normal.  Eyes: Conjunctivae normal, EOM and lids are normal. Pupils are equal, round, and  reactive to light. No foreign bodies found.  Neck: Trachea normal, normal range of motion and phonation normal. Neck supple. Carotid bruit is not present. No mass and no thyromegaly present.  Cardiovascular: Normal rate, regular rhythm, S1 normal, S2  normal, intact distal pulses and normal pulses. Exam reveals no gallop.  No murmur heard.  Pulmonary/Chest: Breath sounds normal. He has no wheezes. He has no rhonchi. He has no rales.  Abdominal: Soft. Normal appearance and bowel sounds are normal. There is no hepatosplenomegaly. There is no tenderness. There is no rebound, no guarding and no CVA tenderness. No hernia. Hernia confirmed negative in the right inguinal area and confirmed negative in the left inguinal area.  Genitourinary: Testes normal and penis normal. Right testis shows no mass and no tenderness. Left testis shows no mass and no tenderness. No paraphimosis or penile tenderness.  Lymphadenopathy:  He has no cervical adenopathy.  Right: No inguinal adenopathy present.  Left: No inguinal adenopathy present.  Neurological: He is alert. He has normal strength and normal reflexes. No cranial nerve deficit or sensory deficit. Gait normal.  Skin: Skin is warm, dry and intact. No rash noted.  Psychiatric: He has a normal mood and affect. His speech is normal and behavior is normal. Judgment normal.  Assessment & Plan:   The patient's preventative maintenance and recommended screening tests for an annual wellness exam were reviewed in full today.  Brought up to date unless services declined.  Counselled on the importance of diet, exercise, and its role in overall health and mortality.  The patient's FH and SH was reviewed, including their home life, tobacco status, and drug and alcohol status.   Vaccines: Uptodate flu and prevnar given today.  Nonsmoker  Lab Results  Component Value Date   PSA 0.83 02/02/2016   PSA 0.61 02/03/2015   PSA 0.66 01/24/2014  Colon cancer screening: Colonoscopy 08/2012 nml, repeat in 5 years. Hep C: done with eval for increase LFTs.  HIV: neg test 2 years ago.

## 2016-02-09 NOTE — Progress Notes (Signed)
Pre visit review using our clinic review tool, if applicable. No additional management support is needed unless otherwise documented below in the visit note. 

## 2016-02-09 NOTE — Assessment & Plan Note (Signed)
Poor control.. followed by psychiatry . Has appt tommorow.

## 2016-02-10 ENCOUNTER — Encounter: Payer: Self-pay | Admitting: Psychiatry

## 2016-02-10 ENCOUNTER — Telehealth: Payer: Self-pay

## 2016-02-10 ENCOUNTER — Ambulatory Visit (INDEPENDENT_AMBULATORY_CARE_PROVIDER_SITE_OTHER): Payer: Medicare Other | Admitting: Psychiatry

## 2016-02-10 ENCOUNTER — Ambulatory Visit: Payer: 59 | Admitting: Cardiology

## 2016-02-10 VITALS — BP 114/67 | HR 65 | Temp 98.5°F | Wt 158.8 lb

## 2016-02-10 DIAGNOSIS — F411 Generalized anxiety disorder: Secondary | ICD-10-CM | POA: Diagnosis not present

## 2016-02-10 DIAGNOSIS — F39 Unspecified mood [affective] disorder: Secondary | ICD-10-CM | POA: Diagnosis not present

## 2016-02-10 MED ORDER — FLUOXETINE HCL 10 MG PO CAPS: 10.0000 mg | ORAL_CAPSULE | Freq: Every morning | ORAL | 3 refills | Status: DC

## 2016-02-10 MED ORDER — DEPLIN 15 15-90.314 MG PO CAPS: 15.0000 mg | ORAL_CAPSULE | ORAL | 0 refills | Status: DC

## 2016-02-10 NOTE — Telephone Encounter (Signed)
pt states that you started him on deplin pt states that he check with his insurance and it was going to cost $500 for a 30 day supply.  pt states that he can not afford that.  wants something else

## 2016-02-10 NOTE — Progress Notes (Signed)
Psychiatric MD Progress Note   Patient Identification: Shawn Lam MRN:  [id number] Date of Evaluation:  02/10/2016 Referral Source: Stanton Kidney- Therapist Chief Complaint:   Chief Complaint    Follow-up; Medication Refill     Visit Diagnosis:    ICD-9-CM ICD-10-CM   1. Episodic mood disorder ([place]) 296.90 F39   2. Generalized anxiety disorder 300.02 F41.1     History of Present Illness:    Patient is a 65 year old married male who presented for follow-up. He reported that heUsed to have anxiety. Patient reported that he was unable to afford the Abilify which was given to him at his last appointment. He started taking the Prozac and his wife noted that he is becoming more agitated. He took 40 mg of Prozac. He reported that he is not doing well on the medication. He stated that he has been sleeping well with the help of mirtazapine. Patient reported that his anxiety and agitation is getting worse we have tried several mood stabilizers in the past. He continues to have anger and irritability at this time. He appears pleasant and cooperative during the interview.  He denied having any suicidal ideations or plans. He is calm and receptive during the interview. We discussed about adjusting his medications. He denied having any suicidal ideations or plans. He does not use any drugs or alcohol at this time.  He does not use any energy drinks. He only drinks 2 cups of caffeine during the daytime.    Associated Signs/Symptoms: Depression Symptoms:  depressed mood, insomnia, fatigue, anxiety, decreased appetite, (Hypo) Manic Symptoms:  Labiality of Mood, Anxiety Symptoms:  Excessive Worry, Psychotic Symptoms:  none PTSD Symptoms: Negative NA  Past Psychiatric History:  Pt reported history of anxiety and has been taking medications including Zoloft and Xanax prescribed by his primary care physician. He denied any previous history of psychiatric hospitalization. No history of suicide  attempt.  Previous Psychotropic Medications: Xanax 0.5 mg by mouth twice a day Zoloft 100 mg by mouth twice a day   Substance Abuse History in the last 12 months:  Yes.   3 beers/week  Consequences of Substance Abuse: Negative NA  Past Medical History:  Past Medical History:  Diagnosis Date  . Anemia   . Anxiety   . Asymptomatic LV dysfunction    mild with EF 48%  . Berger's disease   . CAD (coronary artery disease) 06/2005   PCI of LAD and RCA  . Depression   . Hematuria   . Hyperlipidemia   . Insomnia   . Left ureteral stone   . Orthostatic hypotension    negative tilt table  . Renal disorder    PT IS SEEING DR. Lorrene Reid- NEPHROLOGIST  . Rosacea   . Urticaria     Past Surgical History:  Procedure Laterality Date  . CARDIAC CATHETERIZATION  09/06/2013   [place]  . CARDIAC CATHETERIZATION    . CORONARY ANGIOPLASTY  2004   STENT PLACEMENT  . CYSTOSCOPY WITH RETROGRADE PYELOGRAM, URETEROSCOPY AND STENT PLACEMENT Left 03/19/2014   Procedure: CYSTOSCOPY WITH RETROGRADE PYELOGRAM, URETEROSCOPY AND STENT PLACEMENT;  Surgeon: Junious Dresser, MD;  Location: [facility];  Service: Urology;  Laterality: Left;  . CYSTOSCOPY WITH RETROGRADE PYELOGRAM, URETEROSCOPY AND STENT PLACEMENT Bilateral 05/20/2015   Procedure:  CYSTOSCOPY WITH BILATERAL RETROGRADE PYELOGRAM,RIGHT  DIAGNOSTIC URETEROSCOPY ,LEFT URETEROSCOPY WITH HOLMIUM LASER  AND BILATERAL  STENT PLACEMENT ;  Surgeon: Alexis Frock, MD;  Location: [facility];  Service: Urology;  Laterality: Bilateral;  .  HOLMIUM LASER APPLICATION Bilateral 04/16/3844   Procedure: HOLMIUM LASER APPLICATION;  Surgeon: Alexis Frock, MD;  Location: [facility];  Service: Urology;  Laterality: Bilateral;  . LEFT HEART CATHETERIZATION WITH CORONARY ANGIOGRAM N/A 09/06/2013   Procedure: LEFT HEART CATHETERIZATION WITH CORONARY ANGIOGRAM;  Surgeon: Sinclair Grooms, MD;  Location: [facility] CATH LAB;  Service: Cardiovascular;  Laterality: N/A;  . LEFT KNEE CAP  SURGERY  ABOUT 40 YRS AGO  ? 1970    . STONE EXTRACTION WITH BASKET Left 03/19/2014   Procedure: STONE EXTRACTION WITH BASKET;  Surgeon: Junious Dresser, MD;  Location: [facility];  Service: Urology;  Laterality: Left;    Family Psychiatric History:  Pt denied  Family History:  Family History  Problem Relation Age of Onset  . Coronary artery disease Mother   . Heart attack Mother   . Heart disease Mother   . Hypertension Mother   . Cancer Father     lung and colon  . Lung cancer Father     smoker  . Diabetes Brother     Social History:   Social History   Social History  . Marital status: Married    Spouse name: N/A  . Number of children: N/A  . Years of education: N/A   Occupational History  . retired Print production planner    Social History Main Topics  . Smoking status: Never Smoker  . Smokeless tobacco: Never Used  . Alcohol use 1.2 - 1.8 oz/week    2 - 3 Cans of beer per week  . Drug use: No  . Sexual activity: Yes   Other Topics Concern  . 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  Worked for [employer].   Allergies:   Allergies  Allergen Reactions  . Codeine Nausea Only  . Tape Other (See Comments)    Use only paper tape. Severe blistering and bruising with other tapes. No cloth tape.    Metabolic Disorder Labs: Lab Results  Component Value Date   HGBA1C 5.5 02/02/2016   No results found for: PROLACTIN Lab Results  Component Value Date   CHOL 156 02/02/2016   TRIG 43.0 02/02/2016   HDL 77.00 02/02/2016   CHOLHDL 2 02/02/2016   VLDL 8.6 02/02/2016   LDLCALC 71 02/02/2016   LDLCALC 70 01/27/2015     Current Medications: Current Outpatient Prescriptions  Medication Sig Dispense Refill  . aspirin EC 81 MG tablet Take 81 mg by mouth daily.    [name] atorvastatin (LIPITOR)  40 MG tablet TAKE 1 TABLET BY MOUTH DAILY. 90 tablet 0  . clopidogrel (PLAVIX) 75 MG tablet Take 1 tablet (75 mg total) by mouth daily. Keep follow up November 2017 appointment to receive further refills. Thank you. 30 tablet 0  . ferrous sulfate 325 (65 FE) MG tablet TAKE 1 TABLET BY MOUTH 3 TIMES DAILY WITH MEALS. 270 tablet 3  . fludrocortisone (FLORINEF) 0.1 MG tablet TAKE 1 TABLET (0.1 MG TOTAL) BY MOUTH DAILY. 90 tablet 3  . FLUOCINOLONE ACETONIDE BODY 0.01 % OIL Apply 1 application topically daily as needed (for dry skin).     [name] KRILL OIL PO Take 1 capsule by mouth daily.    . midodrine (PROAMATINE) 5 MG tablet TAKE 1 TABLET (  5 MG TOTAL) BY MOUTH 2 (TWO) TIMES DAILY WITH BREAKFAST AND LUNCH. 180 tablet 0  . mirtazapine (REMERON) 15 MG tablet Take 1 tablet (15 mg total) by mouth at bedtime. 30 tablet 1  . Multiple Vitamins-Minerals (CENTRUM ADULTS PO) Take 1 tablet by mouth every morning.     [name] NITROSTAT 0.4 MG SL tablet PLACE 1 TABLET (0.4 MG TOTAL) UNDER THE TONGUE EVERY 5 (FIVE) MINUTES AS NEEDED FOR CHEST PAIN. 25 tablet 5  . vitamin B-12 (CYANOCOBALAMIN) 1000 MCG tablet Take 1,000 mcg by mouth at bedtime.     [name] FLUoxetine (PROZAC) 10 MG capsule Take 1 capsule (10 mg total) by mouth every morning. 30 capsule 3  . L-Methylfolate-Algae (DEPLIN 15) 15-90.314 MG CAPS Take 15 mg by mouth every morning. samples 30 capsule 0   No current facility-administered medications for this visit.     Neurologic: Headache: No Seizure: No Paresthesias:No  Musculoskeletal: Strength & Muscle Tone: within normal limits Gait & Station: normal Patient leans: N/A  Psychiatric Specialty Exam: ROS  Blood pressure 114/67, pulse 65, temperature 98.5 F (36.9 C), temperature source Oral, weight 158 lb 12.8 oz (72 kg).Body mass index is 22.46 kg/m.  General Appearance: Casual  Eye Contact:  Fair  Speech:  Clear and Coherent  Volume:  Normal  Mood:  Anxious  Affect:  Congruent  Thought Process:  Goal  Directed  Orientation:  Full (Time, Place, and Person)  Thought Content:  WDL  Suicidal Thoughts:  No  Homicidal Thoughts:  No  Memory:  Immediate;   Fair Recent;   Fair Remote;   Fair  Judgement:  Fair  Insight:  Fair  Psychomotor Activity:  Normal  Concentration:  Concentration: Fair and Attention Span: Fair  Recall:  [employer] of Knowledge:Fair  Language: Fair  Akathisia:  No  Handed:  Right  AIMS (if indicated):    Assets:  Communication Skills Desire for Improvement Physical Health Social Support  ADL's:  Intact  Cognition: WNL  Sleep:  poor    Treatment Plan Summary: Medication management   Discussed with patient at length about the medications treatment risks benefits and alternatives.  Continue  Remeron 15 mg by mouth daily at bedtime to help with his anxiety insomnia and depression. D/c Xanax. D/C Abilify I will decrease the dose of Prozac 10 mg in the morning. Will start on Deplin one pill daily He will continue on Remeron as bedtime.  Follow-up in 2 weeks or earlier depending on his symptoms   More than 50% of the time spent in psychoeducation, counseling and coordination of care.    This note was generated in part or whole with voice recognition software. Voice regonition is usually quite accurate but there are transcription errors that can and very often do occur. I apologize for any typographical errors that were not detected and corrected.   Rainey Pines, MD 11/1/201710:59 AM

## 2016-02-11 NOTE — Telephone Encounter (Signed)
Please advise hm that we can give him samples. It costs $59 per month.

## 2016-02-12 ENCOUNTER — Ambulatory Visit: Payer: Medicare Other | Admitting: Psychiatry

## 2016-02-12 ENCOUNTER — Telehealth: Payer: Self-pay

## 2016-02-12 DIAGNOSIS — R079 Chest pain, unspecified: Secondary | ICD-10-CM

## 2016-02-12 NOTE — Telephone Encounter (Signed)
Left message to call back  

## 2016-02-12 NOTE — Telephone Encounter (Signed)
-----   Message from Sueanne Margarita, MD sent at 02/12/2016 11:43 AM EDT ----- Please order a stress myoview on patient for early next week.  Please let him know that if he develops any chest pain that does not resolve with rest to call 911 and go to ER.  I would like him to refrain from exercise until stress test has been done.  Fransico Him, MD ----- Message ----- From: Jinny Sanders, MD Sent: 02/11/2016   8:26 AM To: Sueanne Margarita, MD   We have a patient in common, Shawn Lam, chart is attached.  I saw him on Tuesday of this week for a wellness visit.  He is an active runner  with CAD and hx of stenting who runs about 9 miles a day. He has noted in last several months that he has chest pain at the end of his run when he is running at his peak speed. The pain is substernal, and resolves when he stops the run. He has no other associated symptoms and this happens on about 3 runs a week. It is not progressing. He feels well other wise (he does have some issues with anxiety that he is seeing psychiatry for, but denies any anxiety at time of chest pain). In our office EKG was stable. See his note for details in EPIC. He has an appt with you at the end of November, I believe the 29th.. Do you think that is okay to wait until then or should he see you sooner?  Thanks for your help,   [name], MD [place] at [facility]

## 2016-02-15 NOTE — Telephone Encounter (Signed)
Follow Up: ° ° ° ° ° °Returning your call from Friday. °

## 2016-02-15 NOTE — Telephone Encounter (Signed)
Left message to call back  

## 2016-02-16 NOTE — Telephone Encounter (Signed)
Spoke to the patient about the home sleep study and he verbalized understanding

## 2016-02-16 NOTE — Telephone Encounter (Signed)
Patient information submitted to home sleep study company

## 2016-02-16 NOTE — Telephone Encounter (Signed)
Order home sleep study  

## 2016-02-16 NOTE — Telephone Encounter (Addendum)
Exercise myoview ordered for scheduling. Instructions reviewed- MyChart message sent to patient with instructions as well.  Myoview ordered for scheduling.  During conversation, the patient stated he wakes up in the middle of the night gasping for air. He had a sleep study 03/18/14 but does not use CPAP (he was told it was mild). He understands he will be called if Dr. Radford Pax has further recommendations.

## 2016-02-18 ENCOUNTER — Telehealth (HOSPITAL_COMMUNITY): Payer: Self-pay | Admitting: *Deleted

## 2016-02-18 NOTE — Telephone Encounter (Signed)
Patient given detailed instructions per Myocardial Perfusion Study Information Sheet for the test on 02/23/16 Patient notified to arrive 15 minutes early and that it is imperative to arrive on time for appointment to keep from having the test rescheduled.  If you need to cancel or reschedule your appointment, please call the office within 24 hours of your appointment. Failure to do so may result in a cancellation of your appointment, and a $50 no show fee. Patient verbalized understanding. Hubbard Robinson, RN

## 2016-02-21 ENCOUNTER — Other Ambulatory Visit: Payer: Self-pay | Admitting: Cardiology

## 2016-02-22 ENCOUNTER — Other Ambulatory Visit: Payer: Self-pay | Admitting: Cardiology

## 2016-02-22 ENCOUNTER — Encounter: Payer: Self-pay | Admitting: Cardiology

## 2016-02-22 DIAGNOSIS — G4733 Obstructive sleep apnea (adult) (pediatric): Secondary | ICD-10-CM | POA: Diagnosis not present

## 2016-02-22 MED ORDER — FLUDROCORTISONE ACETATE 0.1 MG PO TABS: ORAL_TABLET | ORAL | 0 refills | Status: DC

## 2016-02-22 NOTE — Telephone Encounter (Signed)
none

## 2016-02-23 ENCOUNTER — Encounter: Payer: Self-pay | Admitting: Psychiatry

## 2016-02-23 ENCOUNTER — Ambulatory Visit (INDEPENDENT_AMBULATORY_CARE_PROVIDER_SITE_OTHER): Payer: Medicare Other | Admitting: Psychiatry

## 2016-02-23 ENCOUNTER — Ambulatory Visit (HOSPITAL_COMMUNITY): Payer: Medicare Other | Attending: Cardiovascular Disease

## 2016-02-23 VITALS — BP 134/78 | HR 62 | Temp 97.2°F | Resp 17 | Wt 162.6 lb

## 2016-02-23 DIAGNOSIS — F411 Generalized anxiety disorder: Secondary | ICD-10-CM | POA: Diagnosis not present

## 2016-02-23 DIAGNOSIS — R0789 Other chest pain: Secondary | ICD-10-CM | POA: Diagnosis not present

## 2016-02-23 DIAGNOSIS — F39 Unspecified mood [affective] disorder: Secondary | ICD-10-CM | POA: Diagnosis not present

## 2016-02-23 DIAGNOSIS — R002 Palpitations: Secondary | ICD-10-CM | POA: Insufficient documentation

## 2016-02-23 DIAGNOSIS — I517 Cardiomegaly: Secondary | ICD-10-CM | POA: Insufficient documentation

## 2016-02-23 DIAGNOSIS — Z8249 Family history of ischemic heart disease and other diseases of the circulatory system: Secondary | ICD-10-CM | POA: Diagnosis not present

## 2016-02-23 DIAGNOSIS — R9439 Abnormal result of other cardiovascular function study: Secondary | ICD-10-CM | POA: Diagnosis not present

## 2016-02-23 DIAGNOSIS — R0609 Other forms of dyspnea: Secondary | ICD-10-CM | POA: Insufficient documentation

## 2016-02-23 DIAGNOSIS — G4733 Obstructive sleep apnea (adult) (pediatric): Secondary | ICD-10-CM | POA: Diagnosis not present

## 2016-02-23 DIAGNOSIS — R079 Chest pain, unspecified: Secondary | ICD-10-CM

## 2016-02-23 LAB — MYOCARDIAL PERFUSION IMAGING
CHL CUP NUCLEAR SRS: 4
CHL CUP NUCLEAR SSS: 5
CSEPEDS: 30 s
CSEPEW: 16.2 METS
CSEPHR: 92 %
CSEPPHR: 144 {beats}/min
Exercise duration (min): 13 min
LV dias vol: 140 mL (ref 62–150)
LVSYSVOL: 82 mL
MPHR: 155 {beats}/min
NUC STRESS TID: 0.96
RATE: 0.31
Rest HR: 48 {beats}/min
SDS: 1

## 2016-02-23 MED ORDER — FLUOXETINE HCL 40 MG PO CAPS: 40.0000 mg | ORAL_CAPSULE | Freq: Every morning | ORAL | 1 refills | Status: DC

## 2016-02-23 MED ORDER — MIRTAZAPINE 15 MG PO TABS: 15.0000 mg | ORAL_TABLET | Freq: Every day | ORAL | 1 refills | Status: DC

## 2016-02-23 MED ORDER — TECHNETIUM TC 99M TETROFOSMIN IV KIT
31.5000 | PACK | Freq: Once | INTRAVENOUS | Status: AC | PRN
  Administered 2016-02-23: 31.5 via INTRAVENOUS
  Filled 2016-02-23: qty 32

## 2016-02-23 MED ORDER — DEPLIN 15 15-90.314 MG PO CAPS: 15.0000 mg | ORAL_CAPSULE | ORAL | 1 refills | Status: DC

## 2016-02-23 MED ORDER — TECHNETIUM TC 99M TETROFOSMIN IV KIT
10.5000 | PACK | Freq: Once | INTRAVENOUS | Status: AC | PRN
  Administered 2016-02-23: 10.5 via INTRAVENOUS
  Filled 2016-02-23: qty 11

## 2016-02-23 NOTE — Progress Notes (Signed)
Psychiatric MD Progress Note   Patient Identification: Shawn Lam MRN:  [id number] Date of Evaluation:  02/23/2016 Referral Source: Stanton Kidney- Therapist Chief Complaint:   Chief Complaint    Follow-up     Visit Diagnosis:    ICD-9-CM ICD-10-CM   1. Episodic mood disorder ([place]) 296.90 F39   2. Generalized anxiety disorder 300.02 F41.1     History of Present Illness:    Patient is a 65 year old married male who presented for follow-up. He reported that he has noticed some improvement in symptoms although he titrated the dose of Prozac to 40 mg. Patient continues to have issues with his medications and has been self-medicating himself. He reported that he wakes at 4 AM and exercise for almost 1-1/2 hours. Patient also has been taking Remeron 15 mg at bedtime. He reported that he has started taking the gabapentin and feels that he has more energy. He appeared calm and does not have any issues with his anger. He currently denied having any suicidal ideations or plans. He denied having any perceptual disturbances. We discussed about his medications and he was asking about going higher on the Prozac to 60 mg. We discussed about his medication and advised patient not to go higher on the dose of the Prozac and he agreed with the plan.   He denied having any suicidal ideations or plans.He denied having any suicidal ideations or plans. He does not use any drugs or alcohol at this time.   He only drinks 2 cups of caffeine during the daytime.    Associated Signs/Symptoms: Depression Symptoms:  depressed mood, insomnia, fatigue, anxiety, decreased appetite, (Hypo) Manic Symptoms:  Labiality of Mood, Anxiety Symptoms:  Excessive Worry, Psychotic Symptoms:  none PTSD Symptoms: Negative NA  Past Psychiatric History:  Pt reported history of anxiety and has been taking medications including Zoloft and Xanax prescribed by his primary care physician. He denied any previous history of psychiatric  hospitalization. No history of suicide attempt.  Previous Psychotropic Medications: Xanax 0.5 mg by mouth twice a day Zoloft 100 mg by mouth twice a day   Substance Abuse History in the last 12 months:  Yes.   3 beers/week  Consequences of Substance Abuse: Negative NA  Past Medical History:  Past Medical History:  Diagnosis Date  . Anemia   . Anxiety   . Asymptomatic LV dysfunction    mild with EF 48%  . Berger's disease   . CAD (coronary artery disease) 06/2005   PCI of LAD and RCA  . Depression   . Hematuria   . Hyperlipidemia   . Insomnia   . Left ureteral stone   . Orthostatic hypotension    negative tilt table  . Renal disorder    PT IS SEEING DR. Lorrene Reid- NEPHROLOGIST  . Rosacea   . Urticaria     Past Surgical History:  Procedure Laterality Date  . CARDIAC CATHETERIZATION  09/06/2013   [place]  . CARDIAC CATHETERIZATION    . CORONARY ANGIOPLASTY  2004   STENT PLACEMENT  . CYSTOSCOPY WITH RETROGRADE PYELOGRAM, URETEROSCOPY AND STENT PLACEMENT Left 03/19/2014   Procedure: CYSTOSCOPY WITH RETROGRADE PYELOGRAM, URETEROSCOPY AND STENT PLACEMENT;  Surgeon: Junious Dresser, MD;  Location: [facility];  Service: Urology;  Laterality: Left;  . CYSTOSCOPY WITH RETROGRADE PYELOGRAM, URETEROSCOPY AND STENT PLACEMENT Bilateral 05/20/2015   Procedure:  CYSTOSCOPY WITH BILATERAL RETROGRADE PYELOGRAM,RIGHT  DIAGNOSTIC URETEROSCOPY ,LEFT URETEROSCOPY WITH HOLMIUM LASER  AND BILATERAL  STENT PLACEMENT ;  Surgeon: Alexis Frock, MD;  Location: [facility];  Service: Urology;  Laterality: Bilateral;  . HOLMIUM LASER APPLICATION Bilateral 10/09/624   Procedure: HOLMIUM LASER APPLICATION;  Surgeon: Alexis Frock, MD;  Location: [facility];  Service: Urology;  Laterality: Bilateral;  . LEFT HEART CATHETERIZATION WITH CORONARY ANGIOGRAM N/A 09/06/2013   Procedure: LEFT HEART CATHETERIZATION WITH CORONARY ANGIOGRAM;  Surgeon: Sinclair Grooms, MD;  Location: [facility] CATH LAB;  Service: Cardiovascular;   Laterality: N/A;  . LEFT KNEE CAP  SURGERY ABOUT 40 YRS AGO  ? 1970    . STONE EXTRACTION WITH BASKET Left 03/19/2014   Procedure: STONE EXTRACTION WITH BASKET;  Surgeon: Junious Dresser, MD;  Location: [facility];  Service: Urology;  Laterality: Left;    Family Psychiatric History:  Pt denied  Family History:  Family History  Problem Relation Age of Onset  . Coronary artery disease Mother   . Heart attack Mother   . Heart disease Mother   . Hypertension Mother   . Cancer Father     lung and colon  . Lung cancer Father     smoker  . Diabetes Brother     Social History:   Social History   Social History  . Marital status: Married    Spouse name: N/A  . Number of children: N/A  . Years of education: N/A   Occupational History  . retired Print production planner    Social History Main Topics  . Smoking status: Never Smoker  . Smokeless tobacco: Never Used  . Alcohol use 1.2 - 1.8 oz/week    2 - 3 Cans of beer per week  . Drug use: No  . Sexual activity: Yes   Other Topics Concern  . 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  Worked for [employer].   Allergies:   Allergies  Allergen Reactions  . Codeine Nausea Only  . Tape Other (See Comments)    Use only paper tape. Severe blistering and bruising with other tapes. No cloth tape.    Metabolic Disorder Labs: Lab Results  Component Value Date   HGBA1C 5.5 02/02/2016   No results found for: PROLACTIN Lab Results  Component Value Date   CHOL 156 02/02/2016   TRIG 43.0 02/02/2016   HDL 77.00 02/02/2016   CHOLHDL 2 02/02/2016   VLDL 8.6 02/02/2016   LDLCALC 71 02/02/2016   LDLCALC 70 01/27/2015     Current Medications: Current Outpatient Prescriptions  Medication Sig Dispense Refill  . aspirin EC 81 MG tablet Take 81 mg  by mouth daily.    [name] atorvastatin (LIPITOR) 40 MG tablet TAKE 1 TABLET BY MOUTH DAILY. 90 tablet 0  . clopidogrel (PLAVIX) 75 MG tablet Take 1 tablet (75 mg total) by mouth daily. Keep follow up November 2017 appointment to receive further refills. Thank you. 30 tablet 0  . ferrous sulfate 325 (65 FE) MG tablet TAKE 1 TABLET BY MOUTH 3 TIMES DAILY WITH MEALS. 270 tablet 3  . fludrocortisone (FLORINEF) 0.1 MG tablet TAKE 1 TABLET (0.1 MG TOTAL) BY MOUTH DAILY. 90 tablet 0  . FLUOCINOLONE ACETONIDE BODY 0.01 % OIL Apply 1 application topically daily as needed (for dry skin).     [name] FLUoxetine (PROZAC) 40 MG capsule Take 1 capsule (40 mg  total) by mouth every morning. 30 capsule 1  . KRILL OIL PO Take 1 capsule by mouth daily.    [name] L-Methylfolate-Algae (DEPLIN 15) 15-90.314 MG CAPS Take 15 mg by mouth every morning. 30 capsule 1  . midodrine (PROAMATINE) 5 MG tablet TAKE 1 TABLET (5 MG TOTAL) BY MOUTH 2 (TWO) TIMES DAILY WITH BREAKFAST AND LUNCH. 180 tablet 0  . mirtazapine (REMERON) 15 MG tablet Take 1 tablet (15 mg total) by mouth at bedtime. 30 tablet 1  . Multiple Vitamins-Minerals (CENTRUM ADULTS PO) Take 1 tablet by mouth every morning.     [name] NITROSTAT 0.4 MG SL tablet PLACE 1 TABLET (0.4 MG TOTAL) UNDER THE TONGUE EVERY 5 (FIVE) MINUTES AS NEEDED FOR CHEST PAIN. 25 tablet 5  . vitamin B-12 (CYANOCOBALAMIN) 1000 MCG tablet Take 1,000 mcg by mouth at bedtime.      No current facility-administered medications for this visit.     Neurologic: Headache: No Seizure: No Paresthesias:No  Musculoskeletal: Strength & Muscle Tone: within normal limits Gait & Station: normal Patient leans: N/A  Psychiatric Specialty Exam: ROS  Blood pressure 134/78, pulse 62, temperature 97.2 F (36.2 C), temperature source Tympanic, resp. rate 17, weight 162 lb 9.6 oz (73.8 kg), SpO2 99 %.Body mass index is 23.33 kg/m.  General Appearance: Casual  Eye Contact:  Fair  Speech:  Clear and Coherent  Volume:   Normal  Mood:  Anxious  Affect:  Congruent  Thought Process:  Goal Directed  Orientation:  Full (Time, Place, and Person)  Thought Content:  WDL  Suicidal Thoughts:  No  Homicidal Thoughts:  No  Memory:  Immediate;   Fair Recent;   Fair Remote;   Fair  Judgement:  Fair  Insight:  Fair  Psychomotor Activity:  Normal  Concentration:  Concentration: Fair and Attention Span: Fair  Recall:  [employer] of Knowledge:Fair  Language: Fair  Akathisia:  No  Handed:  Right  AIMS (if indicated):    Assets:  Communication Skills Desire for Improvement Physical Health Social Support  ADL's:  Intact  Cognition: WNL  Sleep:  poor    Treatment Plan Summary: Medication management   Discussed with patient at length about the medications treatment risks benefits and alternatives.  Continue  Remeron 15 mg by mouth daily at bedtime to help with his anxiety insomnia and depression. Prozac  40 mg in the morning. Will start on Deplin one pill daily   Follow-up in 2 months or earlier depending on his symptoms   More than 50% of the time spent in psychoeducation, counseling and coordination of care.    This note was generated in part or whole with voice recognition software. Voice regonition is usually quite accurate but there are transcription errors that can and very often do occur. I apologize for any typographical errors that were not detected and corrected.   Rainey Pines, MD 11/14/20171:46 PM

## 2016-02-24 ENCOUNTER — Ambulatory Visit: Payer: Medicare Other | Admitting: Psychiatry

## 2016-02-25 ENCOUNTER — Telehealth: Payer: Self-pay | Admitting: *Deleted

## 2016-02-25 ENCOUNTER — Other Ambulatory Visit: Payer: Self-pay | Admitting: *Deleted

## 2016-02-25 NOTE — Telephone Encounter (Signed)
Spoke to [place] at [facility] and the patient did have his test and the results were faxed on Nov 14. I spoke to the patient he verbalized understanding he was only wondering the results from his test.   Thanks,   Gae Bon

## 2016-02-25 NOTE — Telephone Encounter (Signed)
Patient reports he has not yet heard from anyone regarding scheduling home sleep test. He understands the CPAP assistant will follow-up about this and call him with an update. He was grateful for assistance.

## 2016-02-26 ENCOUNTER — Telehealth: Payer: Self-pay | Admitting: *Deleted

## 2016-02-29 ENCOUNTER — Telehealth: Payer: Self-pay

## 2016-02-29 ENCOUNTER — Other Ambulatory Visit: Payer: Self-pay | Admitting: Cardiology

## 2016-02-29 NOTE — Telephone Encounter (Signed)
Medication management - Telephone call with patient to inform this nurse received his message and contacted his new pharmacy to verify his Deplin order for 15 mg, one each morning as Dr. Gretel Acre had ordered and informed this is what would be mailed to him

## 2016-02-29 NOTE — Telephone Encounter (Signed)
Medication management - Telephone call with Waunita Schooner, pharmacist to verify patient's current Deplin order for 15mg , one every morning as prescribed by Dr. Gretel Acre from 02/23/16 for patient's new mail order pharmacy, [facility] as they can only fill 90 days at a time. Agreed to inform Dr. Gretel Acre of this and to call patient with information correct order would be mailed to him.

## 2016-03-01 ENCOUNTER — Telehealth: Payer: Self-pay | Admitting: *Deleted

## 2016-03-01 NOTE — Telephone Encounter (Signed)
Spoke to the patient and let him know the sleep study has been faxed again  to [place] and he should be hearing his results by next week

## 2016-03-01 NOTE — Telephone Encounter (Signed)
-----   Message from Sueanne Margarita, MD sent at 02/26/2016  4:35 PM EST ----- Regarding: RE: sleep test I cannot find it scanned in  [place] ----- Message ----- From: Freada Bergeron, CMA Sent: 02/25/2016  12:36 PM To: Sueanne Margarita, MD, Theodoro Parma, RN Subject: sleep test                                     Spoke to [place] at [facility] and the patient did have his test and the results were faxed on Nov 14. I spoke to the patient he verbalized understanding he was only wondering the results from his test.  Thanks,  Gae Bon

## 2016-03-05 ENCOUNTER — Encounter: Payer: Self-pay | Admitting: Cardiology

## 2016-03-05 NOTE — Progress Notes (Signed)
HST with moderate OSA with AHI 17/hr.  Please set up in lab CPAP titration

## 2016-03-07 ENCOUNTER — Other Ambulatory Visit: Payer: Self-pay | Admitting: Family Medicine

## 2016-03-07 ENCOUNTER — Other Ambulatory Visit: Payer: Self-pay | Admitting: *Deleted

## 2016-03-07 DIAGNOSIS — G4733 Obstructive sleep apnea (adult) (pediatric): Secondary | ICD-10-CM

## 2016-03-08 DIAGNOSIS — H16223 Keratoconjunctivitis sicca, not specified as Sjogren's, bilateral: Secondary | ICD-10-CM | POA: Diagnosis not present

## 2016-03-08 DIAGNOSIS — Z961 Presence of intraocular lens: Secondary | ICD-10-CM | POA: Diagnosis not present

## 2016-03-08 DIAGNOSIS — H18413 Arcus senilis, bilateral: Secondary | ICD-10-CM | POA: Diagnosis not present

## 2016-03-08 DIAGNOSIS — H40013 Open angle with borderline findings, low risk, bilateral: Secondary | ICD-10-CM | POA: Diagnosis not present

## 2016-03-09 ENCOUNTER — Encounter: Payer: Self-pay | Admitting: *Deleted

## 2016-03-09 ENCOUNTER — Ambulatory Visit: Payer: Medicare Other | Admitting: Cardiology

## 2016-03-10 ENCOUNTER — Telehealth: Payer: Self-pay | Admitting: Family Medicine

## 2016-03-10 DIAGNOSIS — D539 Nutritional anemia, unspecified: Secondary | ICD-10-CM

## 2016-03-10 NOTE — Telephone Encounter (Signed)
-----   Message from Ellamae Sia sent at 03/01/2016 11:23 AM EST ----- Regarding: Lab orders for Friday, 12.1.17 Lab orders, no f/u appt

## 2016-03-11 ENCOUNTER — Other Ambulatory Visit (INDEPENDENT_AMBULATORY_CARE_PROVIDER_SITE_OTHER): Payer: Medicare Other

## 2016-03-11 DIAGNOSIS — D539 Nutritional anemia, unspecified: Secondary | ICD-10-CM

## 2016-03-11 LAB — FERRITIN: Ferritin: 35.2 ng/mL (ref 22.0–322.0)

## 2016-03-11 LAB — CBC WITH DIFFERENTIAL/PLATELET
BASOS ABS: 0.1 10*3/uL (ref 0.0–0.1)
BASOS PCT: 1.2 % (ref 0.0–3.0)
EOS ABS: 0.6 10*3/uL (ref 0.0–0.7)
Eosinophils Relative: 11.6 % — ABNORMAL HIGH (ref 0.0–5.0)
HEMATOCRIT: 37.2 % — AB (ref 39.0–52.0)
HEMOGLOBIN: 12.5 g/dL — AB (ref 13.0–17.0)
LYMPHS PCT: 26.6 % (ref 12.0–46.0)
Lymphs Abs: 1.4 10*3/uL (ref 0.7–4.0)
MCHC: 33.5 g/dL (ref 30.0–36.0)
MCV: 98.7 fl (ref 78.0–100.0)
MONO ABS: 0.3 10*3/uL (ref 0.1–1.0)
Monocytes Relative: 6.2 % (ref 3.0–12.0)
Neutro Abs: 2.8 10*3/uL (ref 1.4–7.7)
Neutrophils Relative %: 54.4 % (ref 43.0–77.0)
Platelets: 169 10*3/uL (ref 150.0–400.0)
RBC: 3.77 Mil/uL — ABNORMAL LOW (ref 4.22–5.81)
RDW: 14.9 % (ref 11.5–15.5)
WBC: 5.1 10*3/uL (ref 4.0–10.5)

## 2016-03-11 LAB — IBC PANEL
IRON: 85 ug/dL (ref 42–165)
SATURATION RATIOS: 24.6 % (ref 20.0–50.0)
TRANSFERRIN: 247 mg/dL (ref 212.0–360.0)

## 2016-03-11 LAB — VITAMIN B12

## 2016-03-15 ENCOUNTER — Ambulatory Visit (INDEPENDENT_AMBULATORY_CARE_PROVIDER_SITE_OTHER): Payer: Medicare Other

## 2016-03-15 ENCOUNTER — Encounter: Payer: Self-pay | Admitting: Cardiology

## 2016-03-15 ENCOUNTER — Ambulatory Visit (INDEPENDENT_AMBULATORY_CARE_PROVIDER_SITE_OTHER): Payer: Medicare Other | Admitting: Cardiology

## 2016-03-15 VITALS — BP 128/68 | HR 70 | Ht 70.0 in | Wt 159.8 lb

## 2016-03-15 DIAGNOSIS — R002 Palpitations: Secondary | ICD-10-CM | POA: Diagnosis not present

## 2016-03-15 DIAGNOSIS — I2510 Atherosclerotic heart disease of native coronary artery without angina pectoris: Secondary | ICD-10-CM | POA: Diagnosis not present

## 2016-03-15 DIAGNOSIS — E7800 Pure hypercholesterolemia, unspecified: Secondary | ICD-10-CM

## 2016-03-15 DIAGNOSIS — I951 Orthostatic hypotension: Secondary | ICD-10-CM | POA: Diagnosis not present

## 2016-03-15 NOTE — Patient Instructions (Signed)
Medication Instructions:  Your physician recommends that you continue on your current medications as directed. Please refer to the Current Medication list given to you today.   Labwork: None  Testing/Procedures: Your physician has recommended that you wear an event monitor. Event monitors are medical devices that record the heart's electrical activity. Doctors most often [place] these monitors to diagnose arrhythmias. Arrhythmias are problems with the speed or rhythm of the heartbeat. The monitor is a small, portable device. You can wear one while you do your normal daily activities. This is usually used to diagnose what is causing palpitations/syncope (passing out).  Follow-Up: Your physician recommends that you schedule a follow-up appointment AS NEEDED pending study results.  Any Other Special Instructions Will Be Listed Below (If Applicable).     If you need a refill on your cardiac medications before your next appointment, please call your pharmacy.

## 2016-03-15 NOTE — Progress Notes (Signed)
Cardiology Office Note    Date:  03/15/2016   ID:  Shawn Lam, DOB Aug 04, 1950, MRN [id number]  PCP:  [name], MD  Cardiologist:  Fransico Him, MD   Chief Complaint  Patient presents with  . Coronary Artery Disease  . Hypertension  . Hyperlipidemia    History of Present Illness:  Shawn Lam is a 65 y.o. male with a history of ASCAD, dyslipidemia, mild LV dysfunction with EF 48% who presents today for followup. He is doing well. He recently complained of some sharp chest pain and nuclear stress test showed no ischemia.  He denies any anginal chest pain, DOE, LE edema, claudication or syncope. He has noticed some SOB associated with fluttering in his chest.  His dizziness is stable on Florinef and proamatine.He has been having intermittent fluttering of his heart which is different from the occasional skipped beats he has had in the past.  It occurs almost daily at some point and lasts up to 1 minute at a time.  Past Medical History:  Diagnosis Date  . Anemia   . Anxiety   . Asymptomatic LV dysfunction    mild with EF 48%  . Berger's disease   . CAD (coronary artery disease) 06/2005   PCI of LAD and RCA  . Depression   . Hematuria   . Hyperlipidemia   . Insomnia   . Left ureteral stone   . Orthostatic hypotension    negative tilt table  . OSA (obstructive sleep apnea)    moderate with AHI 17/hr  . Renal disorder    PT IS SEEING DR. Lorrene Reid- NEPHROLOGIST  . Rosacea   . Urticaria     Past Surgical History:  Procedure Laterality Date  . CARDIAC CATHETERIZATION  09/06/2013   [place]  . CARDIAC CATHETERIZATION    . CORONARY ANGIOPLASTY  2004   STENT PLACEMENT  . CYSTOSCOPY WITH RETROGRADE PYELOGRAM, URETEROSCOPY AND STENT PLACEMENT Left 03/19/2014   Procedure: CYSTOSCOPY WITH RETROGRADE PYELOGRAM, URETEROSCOPY AND STENT PLACEMENT;  Surgeon: Junious Dresser, MD;  Location: [facility];  Service: Urology;  Laterality: Left;  . CYSTOSCOPY WITH RETROGRADE PYELOGRAM,  URETEROSCOPY AND STENT PLACEMENT Bilateral 05/20/2015   Procedure:  CYSTOSCOPY WITH BILATERAL RETROGRADE PYELOGRAM,RIGHT  DIAGNOSTIC URETEROSCOPY ,LEFT URETEROSCOPY WITH HOLMIUM LASER  AND BILATERAL  STENT PLACEMENT ;  Surgeon: Alexis Frock, MD;  Location: [facility];  Service: Urology;  Laterality: Bilateral;  . HOLMIUM LASER APPLICATION Bilateral 04/17/735   Procedure: HOLMIUM LASER APPLICATION;  Surgeon: Alexis Frock, MD;  Location: [facility];  Service: Urology;  Laterality: Bilateral;  . LEFT HEART CATHETERIZATION WITH CORONARY ANGIOGRAM N/A 09/06/2013   Procedure: LEFT HEART CATHETERIZATION WITH CORONARY ANGIOGRAM;  Surgeon: Sinclair Grooms, MD;  Location: [facility];  Service: Cardiovascular;  Laterality: N/A;  . LEFT KNEE CAP  SURGERY ABOUT 40 YRS AGO  ? 1970    . STONE EXTRACTION WITH BASKET Left 03/19/2014   Procedure: STONE EXTRACTION WITH BASKET;  Surgeon: Junious Dresser, MD;  Location: [facility];  Service: Urology;  Laterality: Left;    Current Medications: Outpatient Medications Prior to Visit  Medication Sig Dispense Refill  . aspirin EC 81 MG tablet Take 81 mg by mouth daily.    [name] atorvastatin (LIPITOR) 40 MG tablet TAKE 1 TABLET BY MOUTH DAILY. 90 tablet 0  . clopidogrel (PLAVIX) 75 MG tablet Take 1 tablet (75 mg total) by mouth daily. 30 tablet 0  . [facility] IRON 325 (65 Fe)  MG tablet TAKE 1 TABLET BY MOUTH 3 TIMES DAILY WITH MEALS. 270 tablet 3  . fludrocortisone (FLORINEF) 0.1 MG tablet TAKE 1 TABLET (0.1 MG TOTAL) BY MOUTH DAILY. 90 tablet 0  . FLUOCINOLONE ACETONIDE BODY 0.01 % OIL Apply 1 application topically daily as needed (for dry skin).     [name] FLUoxetine (PROZAC) 40 MG capsule Take 1 capsule (40 mg total) by mouth every morning. 30 capsule 1  . KRILL OIL PO Take 1 capsule by mouth daily.    [name] L-Methylfolate-Algae (DEPLIN 15) 15-90.314 MG CAPS Take 15 mg by mouth every morning. 30 capsule 1  . midodrine (PROAMATINE) 5 MG tablet TAKE 1 TABLET (5 MG TOTAL) BY MOUTH 2 (TWO) TIMES  DAILY WITH BREAKFAST AND LUNCH. 180 tablet 0  . mirtazapine (REMERON) 15 MG tablet Take 1 tablet (15 mg total) by mouth at bedtime. 30 tablet 1  . Multiple Vitamins-Minerals (CENTRUM ADULTS PO) Take 1 tablet by mouth every morning.     [name] NITROSTAT 0.4 MG SL tablet PLACE 1 TABLET (0.4 MG TOTAL) UNDER THE TONGUE EVERY 5 (FIVE) MINUTES AS NEEDED FOR CHEST PAIN. 25 tablet 5  . vitamin B-12 (CYANOCOBALAMIN) 1000 MCG tablet Take 1,000 mcg by mouth at bedtime.     [name] L-Methylfolate-Algae (DEPLIN 15) 15-90.314 MG CAPS Take 15 mg by mouth every morning. (Patient not taking: Reported on 03/15/2016) 30 capsule 1   No facility-administered medications prior to visit.      Allergies:   Codeine and Tape   Social History   Social History  . Marital status: Married    Spouse name: N/A  . Number of children: N/A  . Years of education: N/A   Occupational History  . retired Print production planner    Social History Main Topics  . Smoking status: Never Smoker  . Smokeless tobacco: Never Used  . Alcohol use 1.2 - 1.8 oz/week    2 - 3 Cans of beer per week  . Drug use: No  . Sexual activity: Yes   Other Topics Concern  . None   Social History Narrative   Regular exercise--yes--jog 5 miles 6 days a week     Family History:  The patient's family history includes Cancer in his father; Coronary artery disease in his mother; Diabetes in his brother; Heart attack in his mother; Heart disease in his mother; Hypertension in his mother; Lung cancer in his father.   ROS:   Please see the history of present illness.    ROS All other systems reviewed and are negative.  No flowsheet data found.     PHYSICAL EXAM:   VS:  BP 128/68   Pulse 70   Ht 5\' 10"  (1.778 m)   Wt 159 lb 12.8 oz (72.5 kg)   SpO2 99%   BMI 22.93 kg/m    GEN: Well nourished, well developed, in no acute distress  HEENT: normal  Neck: no JVD, carotid bruits, or masses Cardiac: RRR; no murmurs, rubs, or gallops,no edema.  Intact distal  pulses bilaterally.  Respiratory:  clear to auscultation bilaterally, normal work of breathing GI: soft, nontender, nondistended, + BS MS: no deformity or atrophy  Skin: warm and dry, no rash Neuro:  Alert and Oriented x 3, Strength and sensation are intact Psych: euthymic mood, full affect  Wt Readings from Last 3 Encounters:  03/15/16 159 lb 12.8 oz (72.5 kg)  02/23/16 158 lb (71.7 kg)  02/09/16 155 lb 8 oz (70.5 kg)  Studies/Labs Reviewed:   EKG:  EKG is not ordered today.  Recent Labs: 02/02/2016: ALT 23; BUN 24; Creatinine, Ser 1.47; Potassium 5.0; Sodium 144 03/11/2016: Hemoglobin 12.5; Platelets 169.0   Lipid Panel    Component Value Date/Time   CHOL 156 02/02/2016 0752   TRIG 43.0 02/02/2016 0752   HDL 77.00 02/02/2016 0752   CHOLHDL 2 02/02/2016 0752   VLDL 8.6 02/02/2016 0752   LDLCALC 71 02/02/2016 0752   LDLDIRECT 57 06/22/2009 1228    Additional studies/ records that were reviewed today include:  none    ASSESSMENT:    1. Atherosclerosis of native coronary artery of native heart without angina pectoris   2. Orthostatic hypotension   3. Pure hypercholesterolemia   4. Heart palpitations      PLAN:  In order of problems listed above:  1. ASCAD - he has no anginal symptoms.  Continue ASA/statin/Plavix. 2. Orthostatic hypotension - controlled on proamatine and florinef Hyperlipidemia - LDL goal < 70. Continue statin.  LDL at goal at 71. Heart palpitations - I will get a 30 day event monitor to assess for PAF     Medication Adjustments/Labs and Tests Ordered: Current medicines are reviewed at length with the patient today.  Concerns regarding medicines are outlined above.  Medication changes, Labs and Tests ordered today are listed in the Patient Instructions below.  There are no Patient Instructions on file for this visit.   Signed, Fransico Him, MD  03/15/2016 10:21 AM    [address], [place],  [place]  [address] Phone: [phone]; Fax: [phone]

## 2016-03-29 ENCOUNTER — Other Ambulatory Visit: Payer: Self-pay | Admitting: Cardiology

## 2016-04-16 ENCOUNTER — Other Ambulatory Visit: Payer: Self-pay | Admitting: Cardiology

## 2016-04-16 ENCOUNTER — Other Ambulatory Visit: Payer: Self-pay | Admitting: Family Medicine

## 2016-04-18 ENCOUNTER — Other Ambulatory Visit: Payer: Self-pay | Admitting: Psychiatry

## 2016-04-18 NOTE — Telephone Encounter (Signed)
done

## 2016-04-19 ENCOUNTER — Ambulatory Visit: Payer: Medicare Other | Admitting: Psychiatry

## 2016-04-22 ENCOUNTER — Telehealth: Payer: Self-pay | Admitting: Cardiology

## 2016-04-22 NOTE — Telephone Encounter (Signed)
Called patient.  Explained that Dr Radford Pax would like for him to see one of our EP Drs.  I told him that one of the EP schedulers would contact him to schedule an appt.  He expressed understanding.  Notes Recorded by Sueanne Margarita, MD on 04/21/2016 at 10:16 AM EST He has significant problems with orthostatic hypotension so concerned about adding a BB or CCB. Please refer to EP. ------

## 2016-04-22 NOTE — Telephone Encounter (Signed)
New Message     Returning your call from 04/21/16 about recommendations .

## 2016-04-28 ENCOUNTER — Ambulatory Visit (HOSPITAL_BASED_OUTPATIENT_CLINIC_OR_DEPARTMENT_OTHER): Payer: Medicare Other | Attending: Cardiology | Admitting: Cardiology

## 2016-04-28 VITALS — Ht 72.0 in | Wt 155.0 lb

## 2016-04-28 DIAGNOSIS — R000 Tachycardia, unspecified: Secondary | ICD-10-CM | POA: Insufficient documentation

## 2016-04-28 DIAGNOSIS — G4733 Obstructive sleep apnea (adult) (pediatric): Secondary | ICD-10-CM | POA: Insufficient documentation

## 2016-04-29 NOTE — Progress Notes (Signed)
Called the patient and gave him his results, he verbalized understanding

## 2016-04-29 NOTE — Procedures (Signed)
   Patient Name: Shawn Lam, Shawn Lam Date: 04/28/2016 Gender: Male D.O.B: October 23, 1950 Age (years): 42 Referring Provider: Fransico Him MD, ABSM Height (inches): 70 Interpreting Physician: Fransico Him MD, ABSM Weight (lbs): 155 RPSGT: Baxter Flattery BMI: 22 MRN: [id number] Neck Size: 16.00  CLINICAL INFORMATION The patient is referred for a CPAP titration to treat sleep apnea.  Date of NPSG, Split Night or HST:02/21/2017  SLEEP STUDY TECHNIQUE As per the AASM Manual for the Scoring of Sleep and Associated Events v2.3 (April 2016) with a hypopnea requiring 4% desaturations.  The channels recorded and monitored were frontal, central and occipital EEG, electrooculogram (EOG), submentalis EMG (chin), nasal and oral airflow, thoracic and abdominal wall motion, anterior tibialis EMG, snore microphone, electrocardiogram, and pulse oximetry. Continuous positive airway pressure (CPAP) was initiated at the beginning of the study and titrated to treat sleep-disordered breathing.  MEDICATIONS Medications self-administered by patient taken the night of the study : N/A  TECHNICIAN COMMENTS Comments added by technician: Patient had difficulty initiating sleep.  Comments added by scorer: N/A  RESPIRATORY PARAMETERS Optimal PAP Pressure (cm): 10  AHI at Optimal Pressure (/hr):0.0 Overall Minimal O2 (%):93.00  Supine % at Optimal Pressure (%):4 Minimal O2 at Optimal Pressure (%): 96.0    SLEEP ARCHITECTURE The study was initiated at 10:13:36 PM and ended at 5:19:20 AM.  Sleep onset time was 9.6 minutes and the sleep efficiency was 78.8%. The total sleep time was 335.5 minutes.  The patient spent 8.05% of the night in stage N1 sleep, 83.46% in stage N2 sleep, 0.00% in stage N3 and 8.49% in REM.Stage REM latency was 270.0 minutes  Wake after sleep onset was 80.6. Alpha intrusion was absent. Supine sleep was 53.06%.  CARDIAC DATA The 2 lead EKG demonstrated sinus rhythm. The mean heart  rate was 52.39 beats per minute. Other EKG findings include: 6 beat run of wide complex tachycardia.  LEG MOVEMENT DATA The total Periodic Limb Movements of Sleep (PLMS) were 462. The PLMS index was 82.62. A PLMS index of <15 is considered normal in adults.  IMPRESSIONS - The optimal PAP pressure was 10 cm of water. - Central sleep apnea was not noted during this titration (CAI = 0.4/h). - Significant oxygen desaturations were not observed during this titration (min O2 = 93.00%). - No snoring was audible during this study. - 6 beats of nonsustained wide complex tachycardia was observed during this study. - Severe periodic limb movements were observed during this study. Arousals associated with PLMs were rare.  DIAGNOSIS - Obstructive Sleep Apnea (327.23 [G47.33 ICD-10]) - Nonsustained wide complex tachycardia - 6 beats  RECOMMENDATIONS - Trial of CPAP therapy on 10 cm H2O with a Large size Resmed Full Face Mask AirFit F20 mask and heated humidification. - Avoid alcohol, sedatives and other CNS depressants that may worsen sleep apnea and disrupt normal sleep architecture. - Sleep hygiene should be reviewed to assess factors that may improve sleep quality. - Weight management and regular exercise should be initiated or continued. - Return to Sleep Center for re-evaluation after 10 weeks of therapy  [place] [place] [facility]  ELECTRONICALLY SIGNED ON:  04/29/2016, 8:02 AM [place] PH: [phone]   FX: [phone] [place]

## 2016-05-02 NOTE — Telephone Encounter (Signed)
Has the referral been made for him to see Dr. Caryl Comes?

## 2016-05-02 NOTE — Telephone Encounter (Signed)
EP has scheduled patient 1/26 with Dr. Caryl Comes.

## 2016-05-05 NOTE — Progress Notes (Signed)
ELECTROPHYSIOLOGY CONSULT NOTE  Patient ID: Shawn Lam, MRN: [id number], DOB/AGE: 08/11/1950 66 y.o. Admit date: (Not on file) Date of Consult: 05/06/2016  Primary Physician: [name], MD Primary Cardiologist:  TT Consulting Physician TT  Chief Complaint: palpitations   HPI Shawn Lam is a 66 y.o. male  seen in consultation for palpitations which she describes as a flutter that can be associated with lightheadedness. He underwent 30 day monitoring which was personnally reviewed.  There were 8 manual activated events of which  he recalls one Multiple manual activations- mostly all sinus rhythm although some sinus tach One PVC noted w/ exercise and one couplet occurring at rest   He describes these events as flutters up in his throat. They're associated with lightheadedness. They can interrupt his running ( 10 miles/day) and the symptoms last about 3 minutes.  They are unpredicatable occurring about 1 time in 4  He aslo has hx of syncope that has been infrequent and occurred without warning, one following standing the other in the kitchen without warning.  There was little residual effects.    He has CAD with prior stenting *  and is Rx with DAPT    DATE TEST    6/15 Echo EF 35-40%   7/15    MRI   EF 59 % No DGE  11/17    Myoview   EF 42* % Thinning vs infarct          I had seen him 2-1/2 years ago for orthostatic intolerance with shower exercise intolerance and syncope. There was some question as to whether he had IgA nephropathy with salt wasting but dont see that this was ever clarified    Past Medical History:  Diagnosis Date  . Anemia   . Anxiety   . Asymptomatic LV dysfunction    mild with EF 42% by recent nuclear stress test.  . Berger's disease   . CAD (coronary artery disease) 06/2005   PCI of LAD and RCA  . Depression   . Hematuria   . Hyperlipidemia   . Insomnia   . Left ureteral stone   . Orthostatic hypotension    negative tilt table    . OSA (obstructive sleep apnea)    moderate with AHI 17/hr  . Renal disorder    PT IS SEEING DR. Lorrene Reid- NEPHROLOGIST  . Rosacea   . Urticaria       Surgical History:  Past Surgical History:  Procedure Laterality Date  . CARDIAC CATHETERIZATION  09/06/2013   [place]  . CARDIAC CATHETERIZATION    . CORONARY ANGIOPLASTY  2004   STENT PLACEMENT  . CYSTOSCOPY WITH RETROGRADE PYELOGRAM, URETEROSCOPY AND STENT PLACEMENT Left 03/19/2014   Procedure: CYSTOSCOPY WITH RETROGRADE PYELOGRAM, URETEROSCOPY AND STENT PLACEMENT;  Surgeon: Junious Dresser, MD;  Location: [facility];  Service: Urology;  Laterality: Left;  . CYSTOSCOPY WITH RETROGRADE PYELOGRAM, URETEROSCOPY AND STENT PLACEMENT Bilateral 05/20/2015   Procedure:  CYSTOSCOPY WITH BILATERAL RETROGRADE PYELOGRAM,RIGHT  DIAGNOSTIC URETEROSCOPY ,LEFT URETEROSCOPY WITH HOLMIUM LASER  AND BILATERAL  STENT PLACEMENT ;  Surgeon: Alexis Frock, MD;  Location[facility];  Service: Urology;  Laterality: Bilateral;  . HOLMIUM LASER APPLICATION Bilateral 07/15/2701   Procedure: HOLMIUM LASER APPLICATION;  Surgeon: Alexis Frock, MD;  [facility];  Service: Urology;  Laterality: Bilateral;  . LEFT HEART CATHETERIZATION WITH CORONARY ANGIOGRAM N/A 09/06/2013   Procedure: LEFT HEART CATHETERIZATION WITH CORONARY ANGIOGRAM;  Surgeon: Sinclair Grooms, MD;  Location: [facility];  Service: Cardiovascular;  Laterality: N/A;  . LEFT KNEE CAP  SURGERY ABOUT 40 YRS AGO  ? 1970    . STONE EXTRACTION WITH BASKET Left 03/19/2014   Procedure: STONE EXTRACTION WITH BASKET;  Surgeon: Junious Dresser, MD;  [facility];  Service: Urology;  Laterality: Left;     Home Meds: Prior to Admission medications   Medication Sig Start Date End Date Taking? Authorizing Provider  aspirin EC 81 MG tablet Take 81 mg by mouth daily.   Yes Historical Provider, MD  atorvastatin (LIPITOR) 40 MG tablet TAKE 1 TABLET BY MOUTH DAILY. 04/16/16  Yes Amy Cletis [place], MD  clopidogrel  (PLAVIX) 75 MG tablet TAKE 1 TABLET (75 MG TOTAL) BY MOUTH DAILY. 03/29/16  Yes Sueanne Margarita, MD  [facility]S IRON 325 (65 Fe) MG tablet TAKE 1 TABLET BY MOUTH 3 TIMES DAILY WITH MEALS. 03/07/16  Yes Amy E Bedsole, MD  fludrocortisone (FLORINEF) 0.1 MG tablet TAKE 1 TABLET (0.1 MG TOTAL) BY MOUTH DAILY. 02/22/16  Yes Sueanne Margarita, MD  FLUOCINOLONE ACETONIDE BODY 0.01 % OIL Apply 1 application topically daily as needed (for dry skin).  06/20/13  Yes Historical Provider, MD  FLUoxetine (PROZAC) 40 MG capsule TAKE 1 CAPSULE (40 MG TOTAL) BY MOUTH EVERY MORNING. 04/18/16  Yes Rainey Pines, MD  KRILL OIL PO Take 1 capsule by mouth daily.   Yes Historical Provider, MD  L-Methylfolate-Algae (DEPLIN 15) 15-90.314 MG CAPS Take 15 mg by mouth every morning. 02/23/16  Yes Rainey Pines, MD  midodrine (PROAMATINE) 5 MG tablet TAKE 1 TABLET (5 MG TOTAL) BY MOUTH 2 (TWO) TIMES DAILY WITH BREAKFAST AND LUNCH. 04/18/16  Yes Sueanne Margarita, MD  mirtazapine (REMERON) 15 MG tablet Take 1 tablet (15 mg total) by mouth at bedtime. 02/23/16  Yes Rainey Pines, MD  Multiple Vitamins-Minerals (CENTRUM ADULTS PO) Take 1 tablet by mouth every morning.    Yes Historical Provider, MD  NITROSTAT 0.4 MG SL tablet PLACE 1 TABLET (0.4 MG TOTAL) UNDER THE TONGUE EVERY 5 (FIVE) MINUTES AS NEEDED FOR CHEST PAIN. 07/28/15  Yes Sueanne Margarita, MD  vitamin B-12 (CYANOCOBALAMIN) 1000 MCG tablet Take 1,000 mcg by mouth at bedtime.    Yes Historical Provider, MD    Allergies:  Allergies  Allergen Reactions  . Codeine Nausea Only  . Tape Other (See Comments)    Use only paper tape. Severe blistering and bruising with other tapes. No cloth tape.    Social History   Social History  . Marital status: Married    Spouse name: N/A  . Number of children: N/A  . Years of education: N/A   Occupational History  . retired Print production planner    Social History Main Topics  . Smoking status: Never Smoker  . Smokeless tobacco: Never Used  . Alcohol use 1.2 -  1.8 oz/week    2 - 3 Cans of beer per week  . Drug use: No  . Sexual activity: Yes   Other Topics Concern  . Not on file   Social History Narrative   Regular exercise--yes--jog 5 miles 6 days a week     Family History  Problem Relation Age of Onset  . Coronary artery disease Mother   . Heart attack Mother   . Heart disease Mother   . Hypertension Mother   . Cancer Father     lung and colon  . Lung cancer Father     smoker  .  Diabetes Brother      ROS:  Please see the history of present illness.     All other systems reviewed and negative.    Physical Exam: Blood pressure 126/68, pulse 71, height 6' (1.829 m), weight 164 lb 12.8 oz (74.8 kg), SpO2 97 %. General: Well developed, well nourished male in no acute distress. Head: Normocephalic, atraumatic, sclera non-icteric, no xanthomas, nares are without discharge. EENT: normal  Lymph Nodes:  none Neck: Negative for carotid bruits. JVD not elevated. Back:without scoliosis kyphosis Lungs: Clear bilaterally to auscultation without wheezes, rales, or rhonchi. Breathing is unlabored. Heart: RRR with S1 S2. No  murmur . No rubs, or gallops appreciated. Abdomen: Soft, non-tender, non-distended with normoactive bowel sounds. No hepatomegaly. No rebound/guarding. No obvious abdominal masses. Msk:  Strength and tone appear normal for age. Extremities: No clubbing or cyanosis. No  edema.  Distal pedal pulses are 2+ and equal bilaterally. Skin: Warm and Dry Neuro: Alert and oriented X 3. CN III-XII intact Grossly normal sensory and motor function . Psych:  Responds to questions appropriately with a normal affect.      Labs: Cardiac Enzymes No results for input(s): CKTOTAL, CKMB, TROPONINI in the last 72 hours. CBC Lab Results  Component Value Date   WBC 5.1 03/11/2016   HGB 12.5 (L) 03/11/2016   HCT 37.2 (L) 03/11/2016   MCV 98.7 03/11/2016   PLT 169.0 03/11/2016   PROTIME: No results for input(s): LABPROT, INR in the  last 72 hours. Chemistry No results for input(s): NA, K, CL, CO2, BUN, CREATININE, CALCIUM, PROT, BILITOT, ALKPHOS, ALT, AST, GLUCOSE in the last 168 hours.  Invalid input(s): LABALBU Lipids Lab Results  Component Value Date   CHOL 156 02/02/2016   HDL 77.00 02/02/2016   LDLCALC 71 02/02/2016   TRIG 43.0 02/02/2016   BNP No results found for: PROBNP Thyroid Function Tests: No results for input(s): TSH, T4TOTAL, T3FREE, THYROIDAB in the last 72 hours.  Invalid input(s): FREET3 Miscellaneous No results found for: DDIMER  Radiology/Studies:  No results found.  EKG: Sinus 64 02/18/40   Assessment and Plan: * Palpitations  PVCs Couplets  CAD with prior stenting  LV dysfunction - discordant data  Orthostatic intolerance  OSA now on therapy  The pt has had syncope in the setting he is questionable cardiomyopathy. The couplets are concerning and with his cardiomyopathy question we will pursue recorder.  It is not clear to me what is the cause of his symptoms. He had multiple recordings of which 1 had an arrhythmia. Multiple of them had notation as to location and or lack of symptoms. Only one had no limitations at all. Hence, the link implantable recorder may also be helpful for [place] to try to clarify the mechanism of his flutter is although I suspect that they are going to turn out to be PVCs.           Virl Axe

## 2016-05-06 ENCOUNTER — Encounter: Payer: Self-pay | Admitting: *Deleted

## 2016-05-06 ENCOUNTER — Encounter: Payer: Self-pay | Admitting: Internal Medicine

## 2016-05-06 ENCOUNTER — Ambulatory Visit (INDEPENDENT_AMBULATORY_CARE_PROVIDER_SITE_OTHER): Payer: Medicare Other | Admitting: Internal Medicine

## 2016-05-06 VITALS — BP 126/68 | HR 71 | Ht 72.0 in | Wt 164.8 lb

## 2016-05-06 DIAGNOSIS — I951 Orthostatic hypotension: Secondary | ICD-10-CM

## 2016-05-06 DIAGNOSIS — R002 Palpitations: Secondary | ICD-10-CM | POA: Diagnosis not present

## 2016-05-06 NOTE — Patient Instructions (Addendum)
Medication Instructions:    Your physician recommends that you continue on your current medications as directed. Please refer to the Current Medication list given to you today.  --- If you need a refill on your cardiac medications before your next appointment, please call your pharmacy. ---  Labwork:  None ordered  Testing/Procedures: Your physician has recommended that you have a loop recorder inserted.  Please see the instruction sheet given to you today for more information.  Follow-Up:  Your physician recommends that you schedule a follow-up appointment in: 7-10 days, after your procedure on 05/13/2016, with device clinic for a wound check.  Thank you for choosing [facility]!!

## 2016-05-09 DIAGNOSIS — L820 Inflamed seborrheic keratosis: Secondary | ICD-10-CM | POA: Diagnosis not present

## 2016-05-09 DIAGNOSIS — B078 Other viral warts: Secondary | ICD-10-CM | POA: Diagnosis not present

## 2016-05-09 DIAGNOSIS — L570 Actinic keratosis: Secondary | ICD-10-CM | POA: Diagnosis not present

## 2016-05-09 DIAGNOSIS — L821 Other seborrheic keratosis: Secondary | ICD-10-CM | POA: Diagnosis not present

## 2016-05-09 DIAGNOSIS — L578 Other skin changes due to chronic exposure to nonionizing radiation: Secondary | ICD-10-CM | POA: Diagnosis not present

## 2016-05-13 ENCOUNTER — Encounter (HOSPITAL_COMMUNITY)
Admission: RE | Disposition: A | Discharge status: Home / Self Care | Payer: Self-pay | Source: Ambulatory Visit | Attending: Internal Medicine

## 2016-05-13 ENCOUNTER — Ambulatory Visit (HOSPITAL_COMMUNITY)
Admission: RE | Admit: 2016-05-13 | Discharge: 2016-05-13 | Disposition: A | Discharge status: Home / Self Care | Payer: Medicare Other | Source: Ambulatory Visit | Attending: Internal Medicine | Admitting: Internal Medicine

## 2016-05-13 ENCOUNTER — Encounter (HOSPITAL_COMMUNITY): Payer: Self-pay | Admitting: Internal Medicine

## 2016-05-13 DIAGNOSIS — Z7982 Long term (current) use of aspirin: Secondary | ICD-10-CM | POA: Diagnosis not present

## 2016-05-13 DIAGNOSIS — Z801 Family history of malignant neoplasm of trachea, bronchus and lung: Secondary | ICD-10-CM | POA: Diagnosis not present

## 2016-05-13 DIAGNOSIS — I2510 Atherosclerotic heart disease of native coronary artery without angina pectoris: Secondary | ICD-10-CM | POA: Insufficient documentation

## 2016-05-13 DIAGNOSIS — F329 Major depressive disorder, single episode, unspecified: Secondary | ICD-10-CM | POA: Insufficient documentation

## 2016-05-13 DIAGNOSIS — Z833 Family history of diabetes mellitus: Secondary | ICD-10-CM | POA: Diagnosis not present

## 2016-05-13 DIAGNOSIS — Z7902 Long term (current) use of antithrombotics/antiplatelets: Secondary | ICD-10-CM | POA: Diagnosis not present

## 2016-05-13 DIAGNOSIS — Z8249 Family history of ischemic heart disease and other diseases of the circulatory system: Secondary | ICD-10-CM | POA: Diagnosis not present

## 2016-05-13 DIAGNOSIS — R55 Syncope and collapse: Secondary | ICD-10-CM | POA: Diagnosis not present

## 2016-05-13 DIAGNOSIS — F419 Anxiety disorder, unspecified: Secondary | ICD-10-CM | POA: Insufficient documentation

## 2016-05-13 DIAGNOSIS — G4733 Obstructive sleep apnea (adult) (pediatric): Secondary | ICD-10-CM | POA: Insufficient documentation

## 2016-05-13 DIAGNOSIS — N028 Recurrent and persistent hematuria with other morphologic changes: Secondary | ICD-10-CM | POA: Diagnosis not present

## 2016-05-13 DIAGNOSIS — D649 Anemia, unspecified: Secondary | ICD-10-CM | POA: Insufficient documentation

## 2016-05-13 DIAGNOSIS — Z955 Presence of coronary angioplasty implant and graft: Secondary | ICD-10-CM | POA: Diagnosis not present

## 2016-05-13 DIAGNOSIS — E785 Hyperlipidemia, unspecified: Secondary | ICD-10-CM | POA: Diagnosis not present

## 2016-05-13 DIAGNOSIS — I493 Ventricular premature depolarization: Secondary | ICD-10-CM | POA: Insufficient documentation

## 2016-05-13 DIAGNOSIS — Z87442 Personal history of urinary calculi: Secondary | ICD-10-CM | POA: Diagnosis not present

## 2016-05-13 DIAGNOSIS — G4700 Insomnia, unspecified: Secondary | ICD-10-CM | POA: Insufficient documentation

## 2016-05-13 DIAGNOSIS — Z800 Family history of malignant neoplasm of digestive organs: Secondary | ICD-10-CM | POA: Insufficient documentation

## 2016-05-13 DIAGNOSIS — L719 Rosacea, unspecified: Secondary | ICD-10-CM | POA: Insufficient documentation

## 2016-05-13 DIAGNOSIS — I951 Orthostatic hypotension: Secondary | ICD-10-CM | POA: Diagnosis not present

## 2016-05-13 HISTORY — PX: EP IMPLANTABLE DEVICE: SHX172B

## 2016-05-13 SURGERY — LOOP RECORDER INSERTION
Anesthesia: LOCAL

## 2016-05-13 MED ORDER — LIDOCAINE-EPINEPHRINE 1 %-1:100000 IJ SOLN
INTRAMUSCULAR | Status: AC
  Filled 2016-05-13: qty 1

## 2016-05-13 SURGICAL SUPPLY — 2 items
LOOP REVEAL LINQSYS (Prosthesis & Implant Heart) ×2 IMPLANT
PACK LOOP INSERTION (CUSTOM PROCEDURE TRAY) ×2 IMPLANT

## 2016-05-13 NOTE — Interval H&P Note (Signed)
History and Physical Interval Note:  05/13/2016 7:28 AM  Shawn Lam  has presented today for surgery, with the diagnosis of syncope  The various methods of treatment have been discussed with the patient and family. After consideration of risks, benefits and other options for treatment, the patient has consented to  Procedure(s): Loop Recorder Insertion (N/A) as a surgical intervention .  The patient's history has been reviewed, patient examined, no change in status, stable for surgery.  I have reviewed the patient's chart and labs.  Questions were answered to the patient's satisfaction.     Virl Axe

## 2016-05-13 NOTE — H&P (View-Only) (Signed)
ELECTROPHYSIOLOGY CONSULT NOTE  Patient ID: Shawn Lam, MRN: [id number], DOB/AGE: [birth year] 66 y.o. Admit date: (Not on file) Date of Consult: 05/06/2016  Primary Physician: [name], MD Primary Cardiologist:  TT Consulting Physician TT  Chief Complaint: palpitations   HPI [name] is a 66 y.o. male  seen in consultation for palpitations which she describes as a flutter that can be associated with lightheadedness. He underwent 30 day monitoring which was personnally reviewed.  There were 8 manual activated events of which  he recalls one Multiple manual activations- mostly all sinus rhythm although some sinus tach One PVC noted w/ exercise and one couplet occurring at rest   He describes these events as flutters up in his throat. They're associated with lightheadedness. They can interrupt his running ( 10 miles/day) and the symptoms last about 3 minutes.  They are unpredicatable occurring about 1 time in 4  He aslo has hx of syncope that has been infrequent and occurred without warning, one following standing the other in the kitchen without warning.  There was little residual effects.    He has CAD with prior stenting *  and is Rx with DAPT    DATE TEST    6/15 Echo EF 35-40%   7/15    MRI   EF 59 % No DGE  11/17    Myoview   EF 42* % Thinning vs infarct          I had seen him 2-1/2 years ago for orthostatic intolerance with shower exercise intolerance and syncope. There was some question as to whether he had IgA nephropathy with salt wasting but dont see that this was ever clarified    Past Medical History:  Diagnosis Date  . Anemia   . Anxiety   . Asymptomatic LV dysfunction    mild with EF 42% by recent nuclear stress test.  . Berger's disease   . CAD (coronary artery disease) 06/2005   PCI of LAD and RCA  . Depression   . Hematuria   . Hyperlipidemia   . Insomnia   . Left ureteral stone   . Orthostatic hypotension    negative tilt table    . OSA (obstructive sleep apnea)    moderate with AHI 17/hr  . Renal disorder    PT IS SEEING DR. Lorrene Reid- NEPHROLOGIST  . Rosacea   . Urticaria       Surgical History:  Past Surgical History:  Procedure Laterality Date  . CARDIAC CATHETERIZATION  09/06/2013   [place]  . CARDIAC CATHETERIZATION    . CORONARY ANGIOPLASTY  2004   STENT PLACEMENT  . CYSTOSCOPY WITH RETROGRADE PYELOGRAM, URETEROSCOPY AND STENT PLACEMENT Left 03/19/2014   Procedure: CYSTOSCOPY WITH RETROGRADE PYELOGRAM, URETEROSCOPY AND STENT PLACEMENT;  Surgeon: Junious Dresser, MD;  Location: [facility];  Service: Urology;  Laterality: Left;  . CYSTOSCOPY WITH RETROGRADE PYELOGRAM, URETEROSCOPY AND STENT PLACEMENT Bilateral 05/20/2015   Procedure:  CYSTOSCOPY WITH BILATERAL RETROGRADE PYELOGRAM,RIGHT  DIAGNOSTIC URETEROSCOPY ,LEFT URETEROSCOPY WITH HOLMIUM LASER  AND BILATERAL  STENT PLACEMENT ;  Surgeon: Alexis Frock, MD;  Location: [facility];  Service: Urology;  Laterality: Bilateral;  . HOLMIUM LASER APPLICATION Bilateral 12/12/2353   Procedure: HOLMIUM LASER APPLICATION;  Surgeon: Alexis Frock, MD;  Location: [facility];  Service: Urology;  Laterality: Bilateral;  . LEFT HEART CATHETERIZATION WITH CORONARY ANGIOGRAM N/A 09/06/2013   Procedure: LEFT HEART CATHETERIZATION WITH CORONARY ANGIOGRAM;  Surgeon: Sinclair Grooms, MD;  Location: [place] [facility];  Service: Cardiovascular;  Laterality: N/A;  . LEFT KNEE CAP  SURGERY ABOUT 40 YRS AGO  ? 1970    . STONE EXTRACTION WITH BASKET Left 03/19/2014   Procedure: STONE EXTRACTION WITH BASKET;  Surgeon: Junious Dresser, MD;  Location: [facility];  Service: Urology;  Laterality: Left;     Home Meds: Prior to Admission medications   Medication Sig Start Date End Date Taking? Authorizing Provider  aspirin EC 81 MG tablet Take 81 mg by mouth daily.   Yes Historical Provider, MD  atorvastatin (LIPITOR) 40 MG tablet TAKE 1 TABLET BY MOUTH DAILY. 04/16/16  Yes Amy Cletis [place], MD  clopidogrel  (PLAVIX) 75 MG tablet TAKE 1 TABLET (75 MG TOTAL) BY MOUTH DAILY. 03/29/16  Yes Sueanne Margarita, MD  [facility] IRON 325 (65 Fe) MG tablet TAKE 1 TABLET BY MOUTH 3 TIMES DAILY WITH MEALS. 03/07/16  Yes Amy E Bedsole, MD  fludrocortisone (FLORINEF) 0.1 MG tablet TAKE 1 TABLET (0.1 MG TOTAL) BY MOUTH DAILY. 02/22/16  Yes Sueanne Margarita, MD  FLUOCINOLONE ACETONIDE BODY 0.01 % OIL Apply 1 application topically daily as needed (for dry skin).  06/20/13  Yes Historical Provider, MD  FLUoxetine (PROZAC) 40 MG capsule TAKE 1 CAPSULE (40 MG TOTAL) BY MOUTH EVERY MORNING. 04/18/16  Yes Rainey Pines, MD  KRILL OIL PO Take 1 capsule by mouth daily.   Yes Historical Provider, MD  L-Methylfolate-Algae (DEPLIN 15) 15-90.314 MG CAPS Take 15 mg by mouth every morning. 02/23/16  Yes Rainey Pines, MD  midodrine (PROAMATINE) 5 MG tablet TAKE 1 TABLET (5 MG TOTAL) BY MOUTH 2 (TWO) TIMES DAILY WITH BREAKFAST AND LUNCH. 04/18/16  Yes Sueanne Margarita, MD  mirtazapine (REMERON) 15 MG tablet Take 1 tablet (15 mg total) by mouth at bedtime. 02/23/16  Yes Rainey Pines, MD  Multiple Vitamins-Minerals (CENTRUM ADULTS PO) Take 1 tablet by mouth every morning.    Yes Historical Provider, MD  NITROSTAT 0.4 MG SL tablet PLACE 1 TABLET (0.4 MG TOTAL) UNDER THE TONGUE EVERY 5 (FIVE) MINUTES AS NEEDED FOR CHEST PAIN. 07/28/15  Yes Sueanne Margarita, MD  vitamin B-12 (CYANOCOBALAMIN) 1000 MCG tablet Take 1,000 mcg by mouth at bedtime.    Yes Historical Provider, MD    Allergies:  Allergies  Allergen Reactions  . Codeine Nausea Only  . Tape Other (See Comments)    Use only paper tape. Severe blistering and bruising with other tapes. No cloth tape.    Social History   Social History  . Marital status: Married    Spouse name: N/A  . Number of children: N/A  . Years of education: N/A   Occupational History  . retired Print production planner    Social History Main Topics  . Smoking status: Never Smoker  . Smokeless tobacco: Never Used  . Alcohol use 1.2 -  1.8 oz/week    2 - 3 Cans of beer per week  . Drug use: No  . Sexual activity: Yes   Other Topics Concern  . Not on file   Social History Narrative   Regular exercise--yes--jog 5 miles 6 days a week     Family History  Problem Relation Age of Onset  . Coronary artery disease Mother   . Heart attack Mother   . Heart disease Mother   . Hypertension Mother   . Cancer Father     lung and colon  . Lung cancer Father     smoker  .  Diabetes Brother      ROS:  Please see the history of present illness.     All other systems reviewed and negative.    Physical Exam: Blood pressure 126/68, pulse 71, height 6' (1.829 m), weight 164 lb 12.8 oz (74.8 kg), SpO2 97 %. General: Well developed, well nourished male in no acute distress. Head: Normocephalic, atraumatic, sclera non-icteric, no xanthomas, nares are without discharge. EENT: normal  Lymph Nodes:  none Neck: Negative for carotid bruits. JVD not elevated. Back:without scoliosis kyphosis Lungs: Clear bilaterally to auscultation without wheezes, rales, or rhonchi. Breathing is unlabored. Heart: RRR with S1 S2. No  murmur . No rubs, or gallops appreciated. Abdomen: Soft, non-tender, non-distended with normoactive bowel sounds. No hepatomegaly. No rebound/guarding. No obvious abdominal masses. Msk:  Strength and tone appear normal for age. Extremities: No clubbing or cyanosis. No  edema.  Distal pedal pulses are 2+ and equal bilaterally. Skin: Warm and Dry Neuro: Alert and oriented X 3. CN III-XII intact Grossly normal sensory and motor function . Psych:  Responds to questions appropriately with a normal affect.      Labs: Cardiac Enzymes No results for input(s): CKTOTAL, CKMB, TROPONINI in the last 72 hours. CBC Lab Results  Component Value Date   WBC 5.1 03/11/2016   HGB 12.5 (L) 03/11/2016   HCT 37.2 (L) 03/11/2016   MCV 98.7 03/11/2016   PLT 169.0 03/11/2016   PROTIME: No results for input(s): LABPROT, INR in the  last 72 hours. Chemistry No results for input(s): NA, K, CL, CO2, BUN, CREATININE, CALCIUM, PROT, BILITOT, ALKPHOS, ALT, AST, GLUCOSE in the last 168 hours.  Invalid input(s): LABALBU Lipids Lab Results  Component Value Date   CHOL 156 02/02/2016   HDL 77.00 02/02/2016   LDLCALC 71 02/02/2016   TRIG 43.0 02/02/2016   BNP No results found for: PROBNP Thyroid Function Tests: No results for input(s): TSH, T4TOTAL, T3FREE, THYROIDAB in the last 72 hours.  Invalid input(s): FREET3 Miscellaneous No results found for: DDIMER  Radiology/Studies:  No results found.  EKG: Sinus 64 02/18/40   Assessment and Plan: * Palpitations  PVCs Couplets  CAD with prior stenting  LV dysfunction - discordant data  Orthostatic intolerance  OSA now on therapy  The pt has had syncope in the setting he is questionable cardiomyopathy. The couplets are concerning and with his cardiomyopathy question we will pursue recorder.  It is not clear to me what is the cause of his symptoms. He had multiple recordings of which 1 had an arrhythmia. Multiple of them had notation as to location and or lack of symptoms. Only one had no limitations at all. Hence, the link implantable recorder may also be helpful for [place] to try to clarify the mechanism of his flutter is although I suspect that they are going to turn out to be PVCs.           Virl Axe

## 2016-05-13 NOTE — Discharge Instructions (Signed)
Handout given to pt and instructions given verbally and in writing.  Pt and wife verbalize understanding and deny further questions.  Both watched video

## 2016-05-14 ENCOUNTER — Other Ambulatory Visit: Payer: Self-pay | Admitting: Psychiatry

## 2016-05-16 ENCOUNTER — Other Ambulatory Visit: Payer: Self-pay | Admitting: Cardiology

## 2016-05-16 ENCOUNTER — Ambulatory Visit (INDEPENDENT_AMBULATORY_CARE_PROVIDER_SITE_OTHER): Payer: Medicare Other | Admitting: *Deleted

## 2016-05-16 DIAGNOSIS — Z95818 Presence of other cardiac implants and grafts: Secondary | ICD-10-CM

## 2016-05-16 MED FILL — Lidocaine Inj 1% w/ Epinephrine-1:100000: INTRAMUSCULAR | Qty: 20 | Status: AC

## 2016-05-16 NOTE — Progress Notes (Signed)
Pressure dressing removed from LINQ site, small amount serous fluid present on steri-strips, Covered with 4x4s and tegaderm. ROV w/ Deivce cliic 05/23/2016 for wound check and interrogation of LINQ.

## 2016-05-16 NOTE — Telephone Encounter (Signed)
Refilled x 1 month. Need appointment

## 2016-05-23 ENCOUNTER — Ambulatory Visit (INDEPENDENT_AMBULATORY_CARE_PROVIDER_SITE_OTHER): Payer: Medicare Other | Admitting: Family Medicine

## 2016-05-23 ENCOUNTER — Ambulatory Visit (INDEPENDENT_AMBULATORY_CARE_PROVIDER_SITE_OTHER): Payer: Medicare Other | Admitting: *Deleted

## 2016-05-23 ENCOUNTER — Ambulatory Visit (INDEPENDENT_AMBULATORY_CARE_PROVIDER_SITE_OTHER): Payer: Medicare Other | Admitting: Psychiatry

## 2016-05-23 ENCOUNTER — Encounter: Payer: Self-pay | Admitting: Family Medicine

## 2016-05-23 ENCOUNTER — Telehealth: Payer: Self-pay

## 2016-05-23 ENCOUNTER — Other Ambulatory Visit: Payer: Self-pay | Admitting: Psychiatry

## 2016-05-23 ENCOUNTER — Encounter: Payer: Self-pay | Admitting: Psychiatry

## 2016-05-23 ENCOUNTER — Ambulatory Visit (HOSPITAL_COMMUNITY)
Admission: RE | Admit: 2016-05-23 | Discharge: 2016-05-23 | Disposition: A | Discharge status: Home / Self Care | Payer: Medicare Other | Source: Ambulatory Visit | Attending: Family Medicine | Admitting: Family Medicine

## 2016-05-23 VITALS — BP 148/67 | HR 69 | Temp 98.5°F | Wt 175.2 lb

## 2016-05-23 DIAGNOSIS — M7918 Myalgia, other site: Secondary | ICD-10-CM | POA: Insufficient documentation

## 2016-05-23 DIAGNOSIS — F39 Unspecified mood [affective] disorder: Secondary | ICD-10-CM | POA: Diagnosis not present

## 2016-05-23 DIAGNOSIS — M7989 Other specified soft tissue disorders: Secondary | ICD-10-CM | POA: Diagnosis not present

## 2016-05-23 DIAGNOSIS — F411 Generalized anxiety disorder: Secondary | ICD-10-CM | POA: Diagnosis not present

## 2016-05-23 DIAGNOSIS — M791 Myalgia: Secondary | ICD-10-CM | POA: Diagnosis not present

## 2016-05-23 DIAGNOSIS — M79661 Pain in right lower leg: Secondary | ICD-10-CM

## 2016-05-23 DIAGNOSIS — R600 Localized edema: Secondary | ICD-10-CM | POA: Insufficient documentation

## 2016-05-23 DIAGNOSIS — Z95818 Presence of other cardiac implants and grafts: Secondary | ICD-10-CM

## 2016-05-23 LAB — CUP PACEART INCLINIC DEVICE CHECK
Date Time Interrogation Session: 20180212120232
Implantable Pulse Generator Implant Date: 20180202

## 2016-05-23 MED ORDER — FLUOXETINE HCL 20 MG PO CAPS: 20.0000 mg | ORAL_CAPSULE | Freq: Every day | ORAL | 2 refills | Status: DC

## 2016-05-23 MED ORDER — FLUOXETINE HCL 40 MG PO CAPS: 40.0000 mg | ORAL_CAPSULE | Freq: Every morning | ORAL | 1 refills | Status: DC

## 2016-05-23 MED ORDER — FLUOXETINE HCL 20 MG PO TABS: 20.0000 mg | ORAL_TABLET | Freq: Every day | ORAL | 1 refills | Status: DC

## 2016-05-23 MED ORDER — TRAMADOL HCL 50 MG PO TABS: 50.0000 mg | ORAL_TABLET | Freq: Three times a day (TID) | ORAL | 0 refills | Status: DC | PRN

## 2016-05-23 NOTE — Progress Notes (Signed)
*  PRELIMINARY RESULTS* Vascular Ultrasound Right lower extremity venous duplex has been completed.  Preliminary findings: No evidence of DVT or baker's cyst.   Called results to [name].    Landry Mellow, RDMS, RVT  05/23/2016, 10:20 AM

## 2016-05-23 NOTE — Patient Instructions (Signed)
Stop at front desk to set up [place] leg.  Can use tramadol for pain as needed.  if shortness of breath or chest pain.. Go to ER.

## 2016-05-23 NOTE — Progress Notes (Signed)
Pre visit review using our clinic review tool, if applicable. No additional management support is needed unless otherwise documented below in the visit note. 

## 2016-05-23 NOTE — Addendum Note (Signed)
Addended by: Carter Kitten on: 05/23/2016 09:14 AM   Modules accepted: Orders

## 2016-05-23 NOTE — Progress Notes (Signed)
   Subjective:    Patient ID: Shawn Lam, male    DOB: 1950-12-21, 65 y.o.   MRN: [id number]  HPI  66 year old male with CAD presents with new onset pain in right buttock, radiates to right groin x 2 weeks.  Pain is 8-9/10 on pain scale. No low back pain. No weakness , no numbness in leg.   No new fall or new injury.  He jogs regularly. He has continued to be active but walking instead ogf  Running.   Some times walking makes pain better sometimes worse. No change with bending forward or back.   He has also noted in last 2 days swelling in rigth lower leg, sore in right medial ankle. Worse at end of the day.  Aleve helps some, not much.  He has no history of DVT and PE. Recent had loop recorder put in 1 week ago.  He is on plavix  For stent in heart.  Review of Systems  Constitutional: Negative for fatigue and fever.  HENT: Negative for ear pain.   Eyes: Negative for pain.  Respiratory: Negative for shortness of breath.   Cardiovascular: Negative for chest pain.  Gastrointestinal: Positive for nausea.       Objective:   Physical Exam  Constitutional: He is oriented to person, place, and time. Vital signs are normal. He appears well-developed and well-nourished.  HENT:  Head: Normocephalic.  Right Ear: Hearing normal.  Left Ear: Hearing normal.  Nose: Nose normal.  Mouth/Throat: Oropharynx is clear and moist and mucous membranes are normal.  Neck: Trachea normal. Carotid bruit is not present. No thyroid mass and no thyromegaly present.  Cardiovascular: Normal rate, regular rhythm and normal pulses.  Exam reveals no gallop, no distant heart sounds and no friction rub.   No murmur heard. No peripheral edema  Pulmonary/Chest: Effort normal and breath sounds normal. No respiratory distress.  Abdominal: There is no hepatosplenomegaly. There is no tenderness. There is no CVA tenderness.  No ttp in right groin to palpation  Musculoskeletal:       Thoracic back:  Normal.       Lumbar back: Normal. He exhibits normal range of motion, no tenderness and no bony tenderness.  tttp in right sciatic notch, neg SLR, neg Faber's  Neurological: He is alert and oriented to person, place, and time. He has normal strength. He displays a negative Romberg sign. Gait normal.   2 plus pitting edema in right leg up to mid calf, ttp over medial ankle to palpation.  Skin: Skin is warm, dry and intact. No rash noted.  Psychiatric: He has a normal mood and affect. His speech is normal and behavior is normal. Thought content normal.          Assessment & Plan:

## 2016-05-23 NOTE — Progress Notes (Signed)
Psychiatric MD Progress Note   Patient Identification: Shawn Lam MRN:  [id number] Date of Evaluation:  05/23/2016 Referral Source: Stanton Kidney- Therapist Chief Complaint:   Chief Complaint    Follow-up; Medication Refill     Visit Diagnosis:    ICD-9-CM ICD-10-CM   1. Episodic mood disorder ([place]) 296.90 F39   2. Generalized anxiety disorder 300.02 F41.1     History of Present Illness:    Patient is a 66 year old married male who presented for follow-up. He reported that he Has recently started using the CPAP machine after the sleep study was done. Patient also reported that the pacemaker was placed today due to his heart condition. Patient reported that he continues to feel anxious and loses temper quickly. He wants to go higher on the days of the Prozac. We discussed about his medication. He reported that he has not noticed improvement after the CPAP machine. He stated that he has been waking up often at night. He can monitor his sleep on a regular basis. He stated that he has been taking mirtazapine at night as well. He appeared calm and alert during the interview. He currently denied having any suicidal homicidal ideations or plans. We discussed about his medications. He wants to titrate the Prozac to 60 mg briefly and I advised patient that it is going to be a very high dose but he remains adamant about the same. He currently denied having any perceptual disturbances. He was noted to be shaking his legs during the interview.   He only drinks 2 cups of caffeine during the daytime.    Associated Signs/Symptoms: Depression Symptoms:  depressed mood, insomnia, fatigue, anxiety, decreased appetite, (Hypo) Manic Symptoms:  Labiality of Mood, Anxiety Symptoms:  Excessive Worry, Psychotic Symptoms:  none PTSD Symptoms: Negative NA  Past Psychiatric History:  Pt reported history of anxiety and has been taking medications including Zoloft and Xanax prescribed by his primary care  physician. He denied any previous history of psychiatric hospitalization. No history of suicide attempt.  Previous Psychotropic Medications: Xanax 0.5 mg by mouth twice a day Zoloft 100 mg by mouth twice a day   Substance Abuse History in the last 12 months:  Yes.   3 beers/week  Consequences of Substance Abuse: Negative NA  Past Medical History:  Past Medical History:  Diagnosis Date  . Anemia   . Anxiety   . Asymptomatic LV dysfunction    mild with EF 42% by recent nuclear stress test.  . Berger's disease   . CAD (coronary artery disease) 06/2005   PCI of LAD and RCA  . Depression   . Hematuria   . Hyperlipidemia   . Insomnia   . Left ureteral stone   . Orthostatic hypotension    negative tilt table  . OSA (obstructive sleep apnea)    moderate with AHI 17/hr  . Renal disorder    PT IS SEEING DR. Lorrene Reid- NEPHROLOGIST  . Rosacea   . Urticaria     Past Surgical History:  Procedure Laterality Date  . CARDIAC CATHETERIZATION  09/06/2013   [place]  . CARDIAC CATHETERIZATION    . CORONARY ANGIOPLASTY  2004   STENT PLACEMENT  . CYSTOSCOPY WITH RETROGRADE PYELOGRAM, URETEROSCOPY AND STENT PLACEMENT Left 03/19/2014   Procedure: CYSTOSCOPY WITH RETROGRADE PYELOGRAM, URETEROSCOPY AND STENT PLACEMENT;  Surgeon: Junious Dresser, MD;  Location: [facility];  Service: Urology;  Laterality: Left;  . CYSTOSCOPY WITH RETROGRADE PYELOGRAM, URETEROSCOPY AND STENT PLACEMENT Bilateral 05/20/2015   Procedure:  CYSTOSCOPY WITH BILATERAL RETROGRADE PYELOGRAM,RIGHT  DIAGNOSTIC URETEROSCOPY ,LEFT URETEROSCOPY WITH HOLMIUM LASER  AND BILATERAL  STENT PLACEMENT ;  Surgeon: Alexis Frock, MD;  Location: [facility];  Service: Urology;  Laterality: Bilateral;  . EP IMPLANTABLE DEVICE N/A 05/13/2016   Procedure: Loop Recorder Insertion;  Surgeon: Deboraha Sprang, MD;  Location: [place] [facility];  Service: Cardiovascular;  Laterality: N/A;  . HOLMIUM LASER APPLICATION Bilateral 7/0/9628   Procedure: HOLMIUM  LASER APPLICATION;  Surgeon: Alexis Frock, MD;  [facility];  Service: Urology;  Laterality: Bilateral;  . LEFT HEART CATHETERIZATION WITH CORONARY ANGIOGRAM N/A 09/06/2013   Procedure: LEFT HEART CATHETERIZATION WITH CORONARY ANGIOGRAM;  Surgeon: Sinclair Grooms, MD;  Location: [facility];  Service: Cardiovascular;  Laterality: N/A;  . LEFT KNEE CAP  SURGERY ABOUT 40 YRS AGO  ? 1970    . STONE EXTRACTION WITH BASKET Left 03/19/2014   Procedure: STONE EXTRACTION WITH BASKET;  Surgeon: Junious Dresser, MD;  Location[facility];  Service: Urology;  Laterality: Left;    Family Psychiatric History:  Pt denied  Family History:  Family History  Problem Relation Age of Onset  . Coronary artery disease Mother   . Heart attack Mother   . Heart disease Mother   . Hypertension Mother   . Cancer Father     lung and colon  . Lung cancer Father     smoker  . Diabetes Brother     Social History:   Social History   Social History  . Marital status: Married    Spouse name: N/A  . Number of children: N/A  . Years of education: N/A   Occupational History  . retired Print production planner    Social History Main Topics  . Smoking status: Never Smoker  . Smokeless tobacco: Never Used  . Alcohol use 1.2 - 1.8 oz/week    2 - 3 Cans of beer per week  . Drug use: No  . Sexual activity: Yes   Other Topics Concern  . 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  Worked for [employer].   Allergies:   Allergies  Allergen Reactions  . Codeine Nausea Only  . Tape Other (See Comments)    Use only paper tape. Severe blistering and bruising with other tapes. No cloth tape.    Metabolic Disorder Labs: Lab Results  Component Value Date   HGBA1C 5.5 02/02/2016   No results found for: PROLACTIN Lab  Results  Component Value Date   CHOL 156 02/02/2016   TRIG 43.0 02/02/2016   HDL 77.00 02/02/2016   CHOLHDL 2 02/02/2016   VLDL 8.6 02/02/2016   LDLCALC 71 02/02/2016   LDLCALC 70 01/27/2015     Current Medications: Current Outpatient Prescriptions  Medication Sig Dispense Refill  . aspirin EC 81 MG tablet Take 81 mg by mouth at bedtime.     [name] atorvastatin (LIPITOR) 40 MG tablet TAKE 1 TABLET BY MOUTH DAILY. (Patient taking differently: Take 40mg s once daily in the morning) 90 tablet 3  . clopidogrel (PLAVIX) 75 MG tablet TAKE 1 TABLET (75 MG TOTAL) BY MOUTH DAILY. (Patient taking differently: Take 75 mg by mouth at bedtime. ) 90 tablet 3  . [facility] IRON 325 (65  Fe) MG tablet TAKE 1 TABLET BY MOUTH 3 TIMES DAILY WITH MEALS. 270 tablet 3  . fludrocortisone (FLORINEF) 0.1 MG tablet TAKE 1 TABLET (0.1 MG TOTAL) BY MOUTH DAILY. 90 tablet 0  . FLUOCINOLONE ACETONIDE BODY 0.01 % OIL Apply 1 application topically daily as needed (for dry skin).     [name] FLUoxetine (PROZAC) 40 MG capsule Take 1 capsule (40 mg total) by mouth every morning. 30 capsule 1  . Krill Oil 500 MG CAPS Take 500 mg by mouth daily.    [name] L-Methylfolate-Algae (DEPLIN 15) 15-90.314 MG CAPS Take 15 mg by mouth every morning. (Patient taking differently: Take 15 mg by mouth every evening. ) 30 capsule 1  . midodrine (PROAMATINE) 5 MG tablet TAKE 1 TABLET (5 MG TOTAL) BY MOUTH 2 (TWO) TIMES DAILY WITH BREAKFAST AND LUNCH. 180 tablet 3  . mirtazapine (REMERON) 15 MG tablet TAKE 1 TABLET (15 MG TOTAL) BY MOUTH AT BEDTIME. 30 tablet 1  . Multiple Vitamins-Minerals (CENTRUM ADULTS PO) Take 1 tablet by mouth every morning.     [name] NITROSTAT 0.4 MG SL tablet PLACE 1 TABLET (0.4 MG TOTAL) UNDER THE TONGUE EVERY 5 (FIVE) MINUTES AS NEEDED FOR CHEST PAIN. 25 tablet 5  . traMADol (ULTRAM) 50 MG tablet Take 1 tablet (50 mg total) by mouth every 8 (eight) hours as needed. 20 tablet 0  . vitamin B-12 (CYANOCOBALAMIN) 1000 MCG tablet Take 1,000 mcg by  mouth at bedtime.     [name] FLUoxetine (PROZAC) 20 MG tablet Take 1 tablet (20 mg total) by mouth daily. 30 tablet 1   No current facility-administered medications for this visit.     Neurologic: Headache: No Seizure: No Paresthesias:No  Musculoskeletal: Strength & Muscle Tone: within normal limits Gait & Station: normal Patient leans: N/A  Psychiatric Specialty Exam: ROS  Blood pressure (!) 148/67, pulse 69, temperature 98.5 F (36.9 C), temperature source Oral, weight 175 lb 3.2 oz (79.5 kg).Body mass index is 23.76 kg/m.  General Appearance: Casual  Eye Contact:  Fair  Speech:  Clear and Coherent  Volume:  Normal  Mood:  Anxious  Affect:  Congruent  Thought Process:  Goal Directed  Orientation:  Full (Time, Place, and Person)  Thought Content:  WDL  Suicidal Thoughts:  No  Homicidal Thoughts:  No  Memory:  Immediate;   Fair Recent;   Fair Remote;   Fair  Judgement:  Fair  Insight:  Fair  Psychomotor Activity:  Normal  Concentration:  Concentration: Fair and Attention Span: Fair  Recall:  [employer] of Knowledge:Fair  Language: Fair  Akathisia:  No  Handed:  Right  AIMS (if indicated):    Assets:  Communication Skills Desire for Improvement Physical Health Social Support  ADL's:  Intact  Cognition: WNL  Sleep:  poor    Treatment Plan Summary: Medication management   Discussed with patient at length about the medications treatment risks benefits and alternatives.  Continue  Remeron 15 mg by mouth daily at bedtime to help with his anxiety insomnia and depression. I will start him on Prozac 60 mg at this time to help with his anxiety. Advised patient to decrease the dose back to 40 mg if he notices worsening of his symptoms. He agreed with the plan.  He will follow-up in 2 months or earlier depending on his symptoms.  Follow-up in 2 months or earlier depending on his symptoms   More than 50% of the time spent in psychoeducation, counseling and coordination  of care.    This note was generated in part or whole with voice recognition software. Voice regonition is usually quite accurate but there are transcription errors that can and very often do occur. I apologize for any typographical errors that were not detected and corrected.   Rainey Pines, MD 2/12/20182:36 PM

## 2016-05-23 NOTE — Assessment & Plan Note (Signed)
Not clearly sciatica pain, pt without low back pain or typical radiating pain into posterior leg.  Also unusual new swelling in right lower leg.

## 2016-05-23 NOTE — Progress Notes (Signed)
Wound check appointment. Steri-strips removed. Wound without redness or edema. Incision edges approximated, wound well healed. Loop check in clinic. Battery status: good. R-waves 1.78mV. 0 symptom episodes, 0 tachy episodes, 0 pause episodes, 0 brady episodes. 0 AF episodes. Monthly summary reports and ROV with JA PRN

## 2016-05-23 NOTE — Telephone Encounter (Signed)
Pt left v/m requesting cb with results of US done today.

## 2016-05-23 NOTE — Assessment & Plan Note (Signed)
Unclear if connected to pain in right leg, pt has continued to be active. No clear risk factors for DVT but unclear cause of unilateral swelling.  Eval with [place] for DVT.

## 2016-05-23 NOTE — Addendum Note (Signed)
Addended by: Carter Kitten on: 05/23/2016 09:12 AM   Modules accepted: Orders

## 2016-05-24 ENCOUNTER — Encounter: Payer: Self-pay | Admitting: Family Medicine

## 2016-05-24 ENCOUNTER — Telehealth: Payer: Self-pay | Admitting: *Deleted

## 2016-05-24 MED ORDER — DICLOFENAC SODIUM 75 MG PO TBEC: 75.0000 mg | DELAYED_RELEASE_TABLET | Freq: Two times a day (BID) | ORAL | 0 refills | Status: DC

## 2016-05-24 NOTE — Telephone Encounter (Signed)
[name] notified as instructed by telephone.  [facility] sent into [facility] [address].  Sciatica handout mailed to patient as instructed by Dr. Diona Browner. Advised to follow up in 2 weeks if no improvement.

## 2016-05-24 NOTE — Telephone Encounter (Signed)
-----   Message from Jinny Sanders, MD sent at 05/24/2016  9:26 AM EST ----- No evidence of DVT. Elevate leg above heart when sitting. Continue to walk as able.   Call in rx for diclofenac 75 mg BID to use in place of OTC antiinflammatories for possible sciatica. Have pt start home PT for sciatica.. Mail info from book please.  If not improving in 2 weeks have pt follow up.

## 2016-05-31 ENCOUNTER — Other Ambulatory Visit: Payer: Self-pay

## 2016-05-31 NOTE — Telephone Encounter (Signed)
received a fax requesting a 90 day supply of the deplin -algal oil 15mg  capsule taken 1 capsule by mouth every morning.

## 2016-06-03 ENCOUNTER — Other Ambulatory Visit: Payer: Self-pay | Admitting: Family Medicine

## 2016-06-03 ENCOUNTER — Ambulatory Visit (INDEPENDENT_AMBULATORY_CARE_PROVIDER_SITE_OTHER)
Admission: RE | Admit: 2016-06-03 | Discharge: 2016-06-03 | Disposition: A | Discharge status: Home / Self Care | Payer: Medicare Other | Source: Ambulatory Visit | Attending: Family Medicine | Admitting: Family Medicine

## 2016-06-03 ENCOUNTER — Encounter: Payer: Self-pay | Admitting: Family Medicine

## 2016-06-03 ENCOUNTER — Ambulatory Visit (HOSPITAL_COMMUNITY): Payer: Medicare Other

## 2016-06-03 ENCOUNTER — Ambulatory Visit (INDEPENDENT_AMBULATORY_CARE_PROVIDER_SITE_OTHER): Payer: Medicare Other | Admitting: Family Medicine

## 2016-06-03 VITALS — BP 120/62 | HR 77 | Temp 98.2°F | Ht 72.0 in | Wt 176.5 lb

## 2016-06-03 DIAGNOSIS — R600 Localized edema: Secondary | ICD-10-CM

## 2016-06-03 DIAGNOSIS — M791 Myalgia: Secondary | ICD-10-CM | POA: Diagnosis not present

## 2016-06-03 DIAGNOSIS — R590 Localized enlarged lymph nodes: Secondary | ICD-10-CM

## 2016-06-03 DIAGNOSIS — M79652 Pain in left thigh: Secondary | ICD-10-CM

## 2016-06-03 DIAGNOSIS — M7918 Myalgia, other site: Secondary | ICD-10-CM

## 2016-06-03 LAB — CBC WITH DIFFERENTIAL/PLATELET
BASOS ABS: 0.1 10*3/uL (ref 0.0–0.1)
Basophils Relative: 0.7 % (ref 0.0–3.0)
EOS PCT: 12.7 % — AB (ref 0.0–5.0)
Eosinophils Absolute: 1.4 10*3/uL — ABNORMAL HIGH (ref 0.0–0.7)
HCT: 36.9 % — ABNORMAL LOW (ref 39.0–52.0)
HEMOGLOBIN: 12 g/dL — AB (ref 13.0–17.0)
LYMPHS ABS: 1.7 10*3/uL (ref 0.7–4.0)
Lymphocytes Relative: 15.6 % (ref 12.0–46.0)
MCHC: 32.6 g/dL (ref 30.0–36.0)
MCV: 100.4 fl — AB (ref 78.0–100.0)
MONO ABS: 0.6 10*3/uL (ref 0.1–1.0)
MONOS PCT: 5.8 % (ref 3.0–12.0)
NEUTROS PCT: 65.2 % (ref 43.0–77.0)
Neutro Abs: 6.9 10*3/uL (ref 1.4–7.7)
Platelets: 215 10*3/uL (ref 150.0–400.0)
RBC: 3.67 Mil/uL — AB (ref 4.22–5.81)
RDW: 15.2 % (ref 11.5–15.5)
WBC: 10.6 10*3/uL — AB (ref 4.0–10.5)

## 2016-06-03 LAB — COMPREHENSIVE METABOLIC PANEL
ALBUMIN: 3 g/dL — AB (ref 3.5–5.2)
ALK PHOS: 90 U/L (ref 39–117)
ALT: 28 U/L (ref 0–53)
AST: 40 U/L — ABNORMAL HIGH (ref 0–37)
BILIRUBIN TOTAL: 0.3 mg/dL (ref 0.2–1.2)
BUN: 26 mg/dL — ABNORMAL HIGH (ref 6–23)
CO2: 27 mEq/L (ref 19–32)
Calcium: 8.5 mg/dL (ref 8.4–10.5)
Chloride: 111 mEq/L (ref 96–112)
Creatinine, Ser: 1.46 mg/dL (ref 0.40–1.50)
GFR: 51.35 mL/min — ABNORMAL LOW (ref >60.00)
GLUCOSE: 93 mg/dL (ref 70–99)
Potassium: 5.3 mEq/L — ABNORMAL HIGH (ref 3.5–5.1)
SODIUM: 142 meq/L (ref 135–145)
TOTAL PROTEIN: 4.7 g/dL — AB (ref 6.0–8.3)

## 2016-06-03 LAB — BRAIN NATRIURETIC PEPTIDE: PRO B NATRI PEPTIDE: 38 pg/mL (ref 0.0–100.0)

## 2016-06-03 LAB — CK: Total CK: 101 U/L (ref 7–232)

## 2016-06-03 MED ORDER — HYDROCODONE-ACETAMINOPHEN 5-325 MG PO TABS: 1.0000 | ORAL_TABLET | Freq: Four times a day (QID) | ORAL | 0 refills | Status: DC | PRN

## 2016-06-03 MED ORDER — AMOXICILLIN 500 MG PO CAPS: 1000.0000 mg | ORAL_CAPSULE | Freq: Two times a day (BID) | ORAL | 0 refills | Status: DC

## 2016-06-03 NOTE — Progress Notes (Addendum)
   Subjective:    Patient ID: Shawn Lam, male    DOB: July 16, 1950, 66 y.o.   MRN: [id number]  Leg Pain    Foot Pain  Pertinent negatives include no abdominal pain, chest pain, fatigue or fever.    66 year old male presents for follow up right buttock pain and leg swelling.   Neg dopplers for DVT at last OV on 2/12.  He did noted that these symptoms all started  1 week after after loop recorder place.. Had significant bleeding with this and bruising left chest wall.   Started in diclofenac 75 mg BID for pain and inflammation, but not helping much overall, also taking aleve. ... With the thought that  buttock and leg pain may be due to sciatica. Started home PT,  and elevation of leg.  Now in last few days both feet swollen and , now pain in left groin in front with walking. Sharp pain. Pain in buttock is better, no pain radiating down right leg.  He has applied ice to left groin for soreness.  tramadol does not help much with pain.  Noted in last few day ,noted red and sore lumps in left axillae.  No fatigue, no weight loss or night sweats.  No fever.  Wt Readings from Last 3 Encounters:  06/03/16 176 lb 8 oz (80.1 kg)  05/23/16 175 lb 8 oz (79.6 kg)  05/13/16 158 lb (71.7 kg)   He has not been able to run, but he is walking as much as he can. Review of Systems  Constitutional: Negative for fatigue, fever and unexpected weight change.  HENT: Negative for ear pain.   Eyes: Negative for pain.  Respiratory: Negative for shortness of breath.   Cardiovascular: Negative for chest pain.  Gastrointestinal: Negative for abdominal distention and abdominal pain.       Objective:   Physical Exam  Cardiovascular:   Bilateral 2 plus edema  Pulmonary/Chest:  Healing contusion left anterior chest wall, nontender  Musculoskeletal:  ttp in right sciatic notch Pain with faber's on left, ttp over left inner thigh.  Lymphadenopathy:       Head (right side): No submental, no  submandibular, no tonsillar, no preauricular, no posterior auricular and no occipital adenopathy present.       Head (left side): No submental, no submandibular, no tonsillar, no preauricular, no posterior auricular and no occipital adenopathy present.    He has no cervical adenopathy.    He has axillary adenopathy.       Left axillary: Lateral adenopathy present.       Right: No inguinal, no supraclavicular and no epitrochlear adenopathy present.       Left: No inguinal, no supraclavicular and no epitrochlear adenopathy present.   Chain of 2-3 lymph nodes left axillae, separate one laterally that has purplish skin overlying.   All mobile, mildly tender          Assessment & Plan:   Left axillary lymph nodes,  right buttock pain and left groin pain... No other lymph nodes palpated.  Will eval with cbc   Will eval CXR for other lymphadenopathy  ? If is is related to  Loop recorder placement and resulting bleeding after? Will eval with BNP and CMET.  Given, muscle pain .. eval with CK.

## 2016-06-03 NOTE — Progress Notes (Signed)
Pre visit review using our clinic review tool, if applicable. No additional management support is needed unless otherwise documented below in the visit note. 

## 2016-06-03 NOTE — Addendum Note (Signed)
Addended by: [name] E on: 06/03/2016 10:37 AM   Modules accepted: Orders

## 2016-06-03 NOTE — Patient Instructions (Addendum)
Stop at lab on way out.  We will call with X-ray report.  Stop aleve, ibuprofen and tramadol.  Use vicodin for pain as needed.

## 2016-06-04 ENCOUNTER — Encounter: Payer: Self-pay | Admitting: Family Medicine

## 2016-06-04 MED ORDER — AMOXICILLIN 500 MG PO CAPS: 1000.0000 mg | ORAL_CAPSULE | Freq: Two times a day (BID) | ORAL | 0 refills | Status: DC

## 2016-06-06 ENCOUNTER — Encounter: Payer: Self-pay | Admitting: Family Medicine

## 2016-06-07 ENCOUNTER — Ambulatory Visit (INDEPENDENT_AMBULATORY_CARE_PROVIDER_SITE_OTHER)
Admission: RE | Admit: 2016-06-07 | Discharge: 2016-06-07 | Disposition: A | Discharge status: Home / Self Care | Payer: Medicare Other | Source: Ambulatory Visit | Attending: Family Medicine | Admitting: Family Medicine

## 2016-06-07 ENCOUNTER — Ambulatory Visit (INDEPENDENT_AMBULATORY_CARE_PROVIDER_SITE_OTHER): Payer: Medicare Other | Admitting: Family Medicine

## 2016-06-07 ENCOUNTER — Encounter: Payer: Self-pay | Admitting: Family Medicine

## 2016-06-07 VITALS — BP 106/60 | HR 73 | Temp 98.2°F | Ht 72.0 in | Wt 178.5 lb

## 2016-06-07 DIAGNOSIS — M79652 Pain in left thigh: Secondary | ICD-10-CM

## 2016-06-07 DIAGNOSIS — M545 Low back pain: Secondary | ICD-10-CM | POA: Diagnosis not present

## 2016-06-07 DIAGNOSIS — I951 Orthostatic hypotension: Secondary | ICD-10-CM | POA: Diagnosis not present

## 2016-06-07 DIAGNOSIS — R590 Localized enlarged lymph nodes: Secondary | ICD-10-CM

## 2016-06-07 DIAGNOSIS — R600 Localized edema: Secondary | ICD-10-CM | POA: Diagnosis not present

## 2016-06-07 DIAGNOSIS — M25552 Pain in left hip: Secondary | ICD-10-CM | POA: Diagnosis not present

## 2016-06-07 DIAGNOSIS — N028 Recurrent and persistent hematuria with other morphologic changes: Secondary | ICD-10-CM | POA: Diagnosis not present

## 2016-06-07 DIAGNOSIS — N02B9 Other recurrent and persistent immunoglobulin A nephropathy: Secondary | ICD-10-CM

## 2016-06-07 LAB — POC URINALSYSI DIPSTICK (AUTOMATED)
Bilirubin, UA: NEGATIVE
Blood, UA: NEGATIVE
GLUCOSE UA: NEGATIVE
Ketones, UA: NEGATIVE
LEUKOCYTES UA: NEGATIVE
NITRITE UA: NEGATIVE
PROTEIN UA: NEGATIVE
UROBILINOGEN UA: 0.2
pH, UA: 6

## 2016-06-07 MED ORDER — HYDROCODONE-ACETAMINOPHEN 5-325 MG PO TABS: 1.0000 | ORAL_TABLET | Freq: Four times a day (QID) | ORAL | 0 refills | Status: DC | PRN

## 2016-06-07 NOTE — Telephone Encounter (Signed)
Can you please call him and have him come in today...likely wil schedule imaging of left upper leg, and possibly back.

## 2016-06-07 NOTE — Progress Notes (Signed)
   Subjective:    Patient ID: Shawn Lam, male    DOB: [birth year], 66 y.o.   MRN: [id number]  HPI  67 year old male presents to clinic today with severe debilitating pain in left groin. Over the weekend it has gotten to a point that walking was difficult.  He has had to start using a cane.  Vicodin helps some but pain never goes away. Gets some better as day goes.  Still no weakness in legs, no numbness.   No fever.   No CP, no SOB.   He reports  Less dizzy spells and heart fluttering with exercise.. This is what the loop recorder was evaluating.   Started antibiotics in last 3 days for left axillary lymphadenitis.[name] No change in size of lymph nodes.  Given CKD and low protein.. Will check UA today. Clear.. No protein in urine  Weight has continued to increase from swelling in last few days. Wt Readings from Last 3 Encounters:  06/07/16 178 lb 8 oz (81 kg)  06/03/16 176 lb 8 oz (80.1 kg)  05/23/16 175 lb 8 oz (79.6 kg)     Never ortho in past. Has appt with Dr. Lorrene Reid , nephrologist, toward end of year. Dx igA nephropathy  Has appt with [facility] Dr. Tammi Klippel in 4/29   Blood pressure 106/60, pulse 73, temperature 98.2 F (36.8 C), temperature source Oral, height 6' (1.829 m), weight 178 lb 8 oz (81 kg).   Review of Systems  Constitutional: Negative for fatigue and fever.  HENT: Negative for ear pain.   Eyes: Negative for pain.  Respiratory: Negative for cough and shortness of breath.   Cardiovascular: Positive for leg swelling. Negative for chest pain and palpitations.  Gastrointestinal: Negative for abdominal distention.       Objective:   Physical Exam  Cardiovascular:   Bilateral 2 plus edema  Pulmonary/Chest:  Healing contusion left anterior chest wall, nontender  Musculoskeletal:  ttp in right sciatic notch Pain with faber's on left, ttp over left inner thigh.  Lymphadenopathy:       Head (right side): No submental, no submandibular, no tonsillar, no  preauricular, no posterior auricular and no occipital adenopathy present.       Head (left side): No submental, no submandibular, no tonsillar, no preauricular, no posterior auricular and no occipital adenopathy present.    He has no cervical adenopathy.    He has axillary adenopathy.       Left axillary: Lateral adenopathy present.       Right: No inguinal, no supraclavicular and no epitrochlear adenopathy present.       Left: No inguinal, no supraclavicular and no epitrochlear adenopathy present.   Chain of 2-3 lymph nodes left axillae, separate one laterally that has purplish skin overlying.   All mobile, mildly tender          Assessment & Plan:

## 2016-06-07 NOTE — Progress Notes (Signed)
Pre visit review using our clinic review tool, if applicable. No additional management support is needed unless otherwise documented below in the visit note. 

## 2016-06-07 NOTE — Telephone Encounter (Signed)
Appointment scheduled today 06/07/16 at 10:30 am with Dr. Diona Browner.

## 2016-06-07 NOTE — Patient Instructions (Addendum)
Stop at front desk for referral to [facility].  Complete course of amoxicillin for lymphadenopathy in left axillae.  Follow up when completed for re-eval of left axillae.  Call to make early follow up with Dr. Lorrene Reid in next few weeks if able given new onset peripheral swelling in legs.

## 2016-06-08 DIAGNOSIS — S76112A Strain of left quadriceps muscle, fascia and tendon, initial encounter: Secondary | ICD-10-CM | POA: Diagnosis not present

## 2016-06-08 DIAGNOSIS — M545 Low back pain: Secondary | ICD-10-CM | POA: Diagnosis not present

## 2016-06-08 NOTE — Assessment & Plan Note (Addendum)
Given history of dizziness, syncope and orthostasis ( on fluroinef and midodrine) I hesitant to treat with lasix given risk of dropping BP low.

## 2016-06-08 NOTE — Assessment & Plan Note (Signed)
UNclear origin. Will eval with hip, pelvis and lumbar films today. If unrevealing.[name] Refer to ortho.

## 2016-06-08 NOTE — Assessment & Plan Note (Signed)
Appears stable per Creatinine. No protein in urine, with new edema... Recommend returning to nephrology to determine if diuretic appropriate.

## 2016-06-08 NOTE — Assessment & Plan Note (Signed)
Complete antibiotics.. If not improving will broaden and consider referral to surgeon for biopsy.

## 2016-06-09 ENCOUNTER — Other Ambulatory Visit: Payer: Self-pay | Admitting: Nephrology

## 2016-06-09 DIAGNOSIS — N182 Chronic kidney disease, stage 2 (mild): Secondary | ICD-10-CM | POA: Diagnosis not present

## 2016-06-09 DIAGNOSIS — I2510 Atherosclerotic heart disease of native coronary artery without angina pectoris: Secondary | ICD-10-CM | POA: Diagnosis not present

## 2016-06-09 DIAGNOSIS — R609 Edema, unspecified: Secondary | ICD-10-CM | POA: Diagnosis not present

## 2016-06-09 DIAGNOSIS — N200 Calculus of kidney: Secondary | ICD-10-CM | POA: Diagnosis not present

## 2016-06-09 DIAGNOSIS — N028 Recurrent and persistent hematuria with other morphologic changes: Secondary | ICD-10-CM | POA: Diagnosis not present

## 2016-06-09 DIAGNOSIS — D721 Eosinophilia: Secondary | ICD-10-CM | POA: Diagnosis not present

## 2016-06-09 DIAGNOSIS — R591 Generalized enlarged lymph nodes: Secondary | ICD-10-CM

## 2016-06-09 DIAGNOSIS — E785 Hyperlipidemia, unspecified: Secondary | ICD-10-CM | POA: Diagnosis not present

## 2016-06-09 DIAGNOSIS — I951 Orthostatic hypotension: Secondary | ICD-10-CM | POA: Diagnosis not present

## 2016-06-09 DIAGNOSIS — R39198 Other difficulties with micturition: Secondary | ICD-10-CM | POA: Diagnosis not present

## 2016-06-09 DIAGNOSIS — Z95818 Presence of other cardiac implants and grafts: Secondary | ICD-10-CM | POA: Diagnosis not present

## 2016-06-09 DIAGNOSIS — R319 Hematuria, unspecified: Secondary | ICD-10-CM | POA: Diagnosis not present

## 2016-06-13 ENCOUNTER — Ambulatory Visit (INDEPENDENT_AMBULATORY_CARE_PROVIDER_SITE_OTHER): Payer: Medicare Other | Admitting: *Deleted

## 2016-06-13 DIAGNOSIS — E875 Hyperkalemia: Secondary | ICD-10-CM | POA: Diagnosis not present

## 2016-06-13 DIAGNOSIS — R002 Palpitations: Secondary | ICD-10-CM

## 2016-06-14 ENCOUNTER — Ambulatory Visit
Admission: RE | Admit: 2016-06-14 | Discharge: 2016-06-14 | Disposition: A | Discharge status: Home / Self Care | Payer: Medicare Other | Source: Ambulatory Visit | Attending: Nephrology | Admitting: Nephrology

## 2016-06-14 DIAGNOSIS — R591 Generalized enlarged lymph nodes: Secondary | ICD-10-CM

## 2016-06-14 DIAGNOSIS — R590 Localized enlarged lymph nodes: Secondary | ICD-10-CM | POA: Diagnosis not present

## 2016-06-14 NOTE — Progress Notes (Signed)
[facility] Summary Report / Loop Recorder 

## 2016-06-16 ENCOUNTER — Telehealth: Payer: Self-pay | Admitting: Family Medicine

## 2016-06-16 ENCOUNTER — Encounter: Payer: Self-pay | Admitting: Family Medicine

## 2016-06-16 NOTE — Telephone Encounter (Signed)
Let pt know I have spoken with Dr. Lorrene Reid.  Reviewed CT scan.  I have sent a note to Dr. Radford Pax for her consideration of further eval of heart to determine if heart function causing swelling but she is not in office until next week.Is the lymph node swelling in left axillae any  Better, smaller? If not we will need to consider referral for biopsy of the nodes.  Any new symptoms?

## 2016-06-17 NOTE — Telephone Encounter (Signed)
MyChart reply from Mr. Otting sent to [facility] to address.  See [place] email.

## 2016-06-17 NOTE — Telephone Encounter (Signed)
MyChart message sent to Mr. Foot.  Awaiting reply.

## 2016-06-21 ENCOUNTER — Encounter: Payer: Self-pay | Admitting: Family Medicine

## 2016-06-21 NOTE — Telephone Encounter (Signed)
[name].. Dr. Radford Pax was supposed to order an ECHO for [name] per a note from last week on EPIC.[name] Can you call her office and see whether this is in process. I am afraid Dr. Radford Pax forwarded the note only to me by mistake.  The note said... [name],  Set up an ECHO for him. He has not heard from them.  I can order and ECHO, but I am not sure if she wanted it done at her office or if she wants stress ECHO or regular etc.

## 2016-06-22 ENCOUNTER — Telehealth: Payer: Self-pay | Admitting: Cardiology

## 2016-06-22 DIAGNOSIS — R600 Localized edema: Secondary | ICD-10-CM

## 2016-06-22 NOTE — Telephone Encounter (Signed)
New message    Pt states legs and feet really swollen. He states his PCP told him to get in touch with a nurse of Dr. Radford Pax.   Pt c/o swelling: STAT is pt has developed SOB within 24 hours  1. How long have you been experiencing swelling? 3 ro 4 weeks  2. Where is the swelling located? Both legs and feet  3.  Are you currently taking a "fluid pill" no  4.  Are you currently SOB? no  5.  Have you traveled recently? no

## 2016-06-22 NOTE — Telephone Encounter (Signed)
ECHO is scheduled 3/16.

## 2016-06-22 NOTE — Telephone Encounter (Signed)
Patient states he has had "pretty severe" swelling in his lower legs for 3-4 weeks. The swelling is worse at night and is so tight it's painful. He has been limiting salt and elevating his legs while sitting. He does not have compression stockings. Informed him Dr. Radford Pax would like him to have an ECHO completed. ECHO ordered for scheduling. Patient agrees with treatment plan.

## 2016-06-22 NOTE — Telephone Encounter (Signed)
-----   Message from Sueanne Margarita, MD sent at 06/17/2016 12:13 PM EST -----  Valetta Fuller,  Please set him up for 2D echo   Fransico Him ----- Message ----- From: Jinny Sanders, MD Sent: 06/16/2016   5:49 PM To: Sueanne Margarita, MD   Tressia Miners, I called your office today to chat and they stated that the best way to non urgently contact you was with a note in epic.  I need your help in determining if further cardiac eval is indicated in this complex case. Feel free to call me to discuss on my cell at [phone].   A shared pt Shawn Lam presented to me 2 weeks after his loop recorder placement ( 05/13/2016  by Jolyn Nap ( to eval recent orthostatic hypotension, dizziness, presyncope) with  severe lower extremity swelling, initially in right ( neg dopplers for DVT) that progressed to  bilateral legs.  No clear  liver, kidney source of swelling per labs and UA clear for protein. BNP nml. At the same time he developed severe left groin pain and left axillary lymphadenopathy.  CXR was neg for nodes or edema. He was started on antibiotics to treat possible lymphadenitis in left axillae given redness of overlying skin. I sent him to Jamal Maes to consider a worsening of his IGA nephropathy and for consideration of  renal source of his significant  edema and  15 lb fluid gain.  She felt it was not renally related. She feels that a diuretic is not clearly going to help, so recommended against it.  He had at that point started with night sweats... So we obtained a  CT abd pelvis to look for obstruction of lower ext lymph flow from something like lymphoma. It was essentially nml.   Any ideas?  Do you think that he needs further eval for left heart failure? Repeat ECHO, right heart cath? I don't see how this could be related to the loop recorder placement but the patient feels it all started after this procedure where he had significant chest wall contusion, do you see any relation? Do you think he needs to  return to see you early than scheduled on 3/29 for further eval ?  If  he does not have improvement in left axillary lymphadenopathy at completion of antibitoics, I will refer him for biopsy.  Thanks for your help,  [name]

## 2016-06-23 DIAGNOSIS — M6281 Muscle weakness (generalized): Secondary | ICD-10-CM | POA: Diagnosis not present

## 2016-06-24 ENCOUNTER — Other Ambulatory Visit: Payer: Self-pay

## 2016-06-24 ENCOUNTER — Ambulatory Visit (HOSPITAL_COMMUNITY): Payer: Medicare Other | Attending: Cardiology

## 2016-06-24 DIAGNOSIS — I2510 Atherosclerotic heart disease of native coronary artery without angina pectoris: Secondary | ICD-10-CM | POA: Diagnosis not present

## 2016-06-24 DIAGNOSIS — E785 Hyperlipidemia, unspecified: Secondary | ICD-10-CM | POA: Insufficient documentation

## 2016-06-24 DIAGNOSIS — R600 Localized edema: Secondary | ICD-10-CM | POA: Diagnosis not present

## 2016-06-24 DIAGNOSIS — I358 Other nonrheumatic aortic valve disorders: Secondary | ICD-10-CM | POA: Diagnosis not present

## 2016-06-24 DIAGNOSIS — I77810 Thoracic aortic ectasia: Secondary | ICD-10-CM | POA: Diagnosis not present

## 2016-06-24 DIAGNOSIS — I517 Cardiomegaly: Secondary | ICD-10-CM | POA: Diagnosis not present

## 2016-06-24 DIAGNOSIS — I348 Other nonrheumatic mitral valve disorders: Secondary | ICD-10-CM | POA: Diagnosis not present

## 2016-06-27 ENCOUNTER — Telehealth: Payer: Self-pay

## 2016-06-27 DIAGNOSIS — I517 Cardiomegaly: Secondary | ICD-10-CM

## 2016-06-27 DIAGNOSIS — I77810 Thoracic aortic ectasia: Secondary | ICD-10-CM

## 2016-06-27 NOTE — Telephone Encounter (Signed)
Informed patient of results and verbal understanding expressed.  Repeat ECHO ordered to be scheduled in 1 year. Confirmed OV with Dr. Radford Pax next week. He understands Dr. Diona Browner is welcome to call [facility] if there are any questions or concerns. He was grateful for call.

## 2016-06-27 NOTE — Telephone Encounter (Signed)
-----   Message from Sueanne Margarita, MD sent at 06/26/2016  3:02 PM EDT ----- Echo showed low normal LVF with mild LVH and HK of the inferior myocardium, mildly dilated aortic root - repeat echo in 1 year for dilated aortic root

## 2016-06-28 ENCOUNTER — Ambulatory Visit (INDEPENDENT_AMBULATORY_CARE_PROVIDER_SITE_OTHER): Payer: Medicare Other | Admitting: Family Medicine

## 2016-06-28 ENCOUNTER — Encounter: Payer: Self-pay | Admitting: Family Medicine

## 2016-06-28 VITALS — BP 116/68 | HR 79 | Temp 97.7°F | Ht 72.0 in | Wt 170.8 lb

## 2016-06-28 DIAGNOSIS — M79652 Pain in left thigh: Secondary | ICD-10-CM | POA: Diagnosis not present

## 2016-06-28 DIAGNOSIS — R600 Localized edema: Secondary | ICD-10-CM | POA: Diagnosis not present

## 2016-06-28 DIAGNOSIS — R002 Palpitations: Secondary | ICD-10-CM | POA: Diagnosis not present

## 2016-06-28 DIAGNOSIS — R590 Localized enlarged lymph nodes: Secondary | ICD-10-CM | POA: Diagnosis not present

## 2016-06-28 DIAGNOSIS — E8809 Other disorders of plasma-protein metabolism, not elsewhere classified: Secondary | ICD-10-CM

## 2016-06-28 DIAGNOSIS — I2510 Atherosclerotic heart disease of native coronary artery without angina pectoris: Secondary | ICD-10-CM | POA: Diagnosis not present

## 2016-06-28 DIAGNOSIS — N028 Recurrent and persistent hematuria with other morphologic changes: Secondary | ICD-10-CM | POA: Diagnosis not present

## 2016-06-28 DIAGNOSIS — N02B9 Other recurrent and persistent immunoglobulin A nephropathy: Secondary | ICD-10-CM

## 2016-06-28 LAB — COMPREHENSIVE METABOLIC PANEL
ALK PHOS: 96 U/L (ref 39–117)
ALT: 28 U/L (ref 0–53)
AST: 41 U/L — ABNORMAL HIGH (ref 0–37)
Albumin: 3.3 g/dL — ABNORMAL LOW (ref 3.5–5.2)
BUN: 24 mg/dL — AB (ref 6–23)
CO2: 27 meq/L (ref 19–32)
Calcium: 8.9 mg/dL (ref 8.4–10.5)
Chloride: 107 mEq/L (ref 96–112)
Creatinine, Ser: 1.41 mg/dL (ref 0.40–1.50)
GFR: 53.45 mL/min — ABNORMAL LOW (ref >60.00)
GLUCOSE: 101 mg/dL — AB (ref 70–99)
POTASSIUM: 4.4 meq/L (ref 3.5–5.1)
SODIUM: 140 meq/L (ref 135–145)
TOTAL PROTEIN: 5.2 g/dL — AB (ref 6.0–8.3)
Total Bilirubin: 0.4 mg/dL (ref 0.2–1.2)

## 2016-06-28 LAB — T3, FREE: T3, Free: 2.6 pg/mL (ref 2.3–4.2)

## 2016-06-28 LAB — T4, FREE: Free T4: 0.68 ng/dL (ref 0.60–1.60)

## 2016-06-28 LAB — TSH: TSH: 4.9 u[IU]/mL — ABNORMAL HIGH (ref 0.35–4.50)

## 2016-06-28 MED ORDER — HYDROCODONE-ACETAMINOPHEN 5-325 MG PO TABS: 1.0000 | ORAL_TABLET | Freq: Every day | ORAL | 0 refills | Status: DC | PRN

## 2016-06-28 NOTE — Progress Notes (Signed)
Pre visit review using our clinic review tool, if applicable. No additional management support is needed unless otherwise documented below in the visit note. 

## 2016-06-28 NOTE — Assessment & Plan Note (Addendum)
Kidney status has been stable per Dr. Lorrene Reid.

## 2016-06-28 NOTE — Progress Notes (Signed)
Subjective:    Patient ID: Shawn Lam, male    DOB: [birth year], 66 y.o.   MRN: [id number]  HPI  66 year old male with history of Ig A nephropathy, OSA and  CAD who presents  with continued lower extremity edema. This has been ongoing x 1.5 months, per pt ever since 1-2 weeks after loop recorder insertion.  He has also noted in last several months palpitations, presyncope ( this was reason lopp recorder placed) night sweats, left anterior thigh pain as well as left axillary lymph adenopathy.   Pt has had stable kidney function, no protein in urine, nml BNP and stable ECHO.  No change in liver function.  Also a neg Ab/ pelvic CT showing no clear occlusive lymphadenopathy.  Neg left sided DVT ( as swelling was initially left sided)   He has seen nephrology Dr. Lorrene Reid.. Who agrees no clear kidney source of edema. She had recommended against starting lasix.  Left axillary lymphadenopathy has been treated with 10 days of amoxicillin.    Today, pt reports continued swelling bilaterally equal, better in morning but worse in evening.  He is seeing ORTHO for left groin pain (felt due to groin strain).. Treated with Pt and muscle relaxers.  Just started Pt.. Has only gone once. Pt now having more left low back pain than groin pain Using hydrocodone for pain as needed.. Was using 3 a day.  Has follow up in 3 months.  He feels the lymph nodes in left  axillae significantly smaller.  Non tender.. No redness.  no fever. He has occ night sweat.. Not as bad as previously.  Not taking any NSAIDs.  Feels really tired, decreased energy. No cold intolerance, no hair loss, occ constipation.  Wt Readings from Last 3 Encounters:  06/28/16 170 lb 12 oz (77.5 kg)  06/07/16 178 lb 8 oz (81 kg)  06/03/16 176 lb 8 oz (80.1 kg)   BP Readings from Last 3 Encounters:  06/28/16 116/68  06/07/16 106/60  06/03/16 120/62     Review of Systems  Constitutional: Positive for fatigue. Negative for  fever.  HENT: Negative for ear pain.   Eyes: Negative for pain.  Respiratory: Negative for chest tightness and shortness of breath.   Cardiovascular: Positive for palpitations and leg swelling. Negative for chest pain.  Gastrointestinal: Negative for abdominal pain.       Objective:   Physical Exam  Constitutional: Vital signs are normal. He appears well-developed and well-nourished.  HENT:  Head: Normocephalic.  Right Ear: Hearing normal.  Left Ear: Hearing normal.  Nose: Nose normal.  Mouth/Throat: Oropharynx is clear and moist and mucous membranes are normal.  Neck: Trachea normal. Carotid bruit is not present. No thyroid mass and no thyromegaly present.  Cardiovascular: Normal rate, regular rhythm and normal pulses.  Exam reveals no gallop, no distant heart sounds and no friction rub.   No murmur heard. Bilateral 2 plus pitting peripheral edema  No varicose veins.  Pulmonary/Chest: Effort normal and breath sounds normal. No respiratory distress.  Lymphadenopathy:    He has no cervical adenopathy.    He has no axillary adenopathy.       Right: No inguinal adenopathy present.       Left: No inguinal adenopathy present.  Skin: Skin is warm, dry and intact. No rash noted.  Psychiatric: He has a normal mood and affect. His speech is normal and behavior is normal. Thought content normal.  Assessment & Plan:

## 2016-06-28 NOTE — Assessment & Plan Note (Signed)
Has upcoming follow up with Dr. Caryl Comes for loop recorder eval.

## 2016-06-28 NOTE — Assessment & Plan Note (Signed)
Has upcoming follow up with Dr Radford Pax.

## 2016-06-28 NOTE — Assessment & Plan Note (Signed)
On exam resolved after antibiotics. No further reason for biopsy or repeat course of  Broader antibiotics.

## 2016-06-28 NOTE — Patient Instructions (Addendum)
Please stop at the lab to set up to have labs drawn.  We will call with results. Limit use of vicodin for left lower back pain for severe pain only. Use tylenol for pain instead.

## 2016-06-28 NOTE — Assessment & Plan Note (Addendum)
Treated for groin strain.. Pain is now essentially resolved on exam. Per pt still off and on pain and pain in low back. Continue PT. Hold muscle relaxant if contributing to fatigue.

## 2016-06-28 NOTE — Assessment & Plan Note (Signed)
Pt state protein intake good.  no protein loss in urine.  No clear liver issue... Recheck today  ? GI related albumin loss causing low albumin and resulting edema?  Recheck albumin today.

## 2016-06-28 NOTE — Assessment & Plan Note (Addendum)
No clear new kidney and heart change per recent work up. No sign of lymphatic obstruction. No clear sign of vascular compromise, no sign of varicose veins on exam..  ? If secondary to fluorinef pt takes for orthostatic hypotension. Pt has been on this though for years.  ? Adrenal issue causing swelling.  Will check thyroid given fatigue and swelling. Albumin low, ?  but no loss in urine. Re-eval.

## 2016-06-29 ENCOUNTER — Telehealth: Payer: Self-pay | Admitting: *Deleted

## 2016-06-29 DIAGNOSIS — R600 Localized edema: Secondary | ICD-10-CM

## 2016-06-29 LAB — CUP PACEART REMOTE DEVICE CHECK
Date Time Interrogation Session: 20180304143534
MDC IDC PG IMPLANT DT: 20180202

## 2016-06-29 MED ORDER — FUROSEMIDE 20 MG PO TABS: 20.0000 mg | ORAL_TABLET | Freq: Every day | ORAL | 0 refills | Status: DC

## 2016-06-29 NOTE — Telephone Encounter (Signed)
[name] notified as instructed by telephone.  Lasix prescription sent into [facility] on [address]  [name] appointment scheduled for 07/04/2016 at 8:00 am.  [name] states he feels like he is getting protein in his diet.  He states he does feel bloated every now and then but no diarrhea.

## 2016-06-29 NOTE — Telephone Encounter (Signed)
-----   Message from Jinny Sanders, MD sent at 06/28/2016  5:02 PM EDT ----- Notify Shawn Lam that potassium is now normal. Thyroid is normal.  Kidney function stable. Still protein and albumin are low.. Is he eating protein? Any trouble with GI tract suggesting he has an issue causing issue absorbing protein.. Diarrhea ?, bloating?  Slight increase in  AST but liver appeared normal on Abd/pelvic CT. No sign of cirrhosis.  Will given small dose lasix x 3 days as Shawn Lam very uncomfortable with swelling.. will follow  Kidney function with BMET closely next Monday.. Call in lasix 20 mg daily x 3 days #3 0 RF. Follow weights, call with update on swelling Friday

## 2016-06-29 NOTE — Telephone Encounter (Signed)
Left message for [name] to return my call.

## 2016-06-29 NOTE — Telephone Encounter (Signed)
Pt returned your call.  

## 2016-06-30 DIAGNOSIS — M545 Low back pain: Secondary | ICD-10-CM | POA: Diagnosis not present

## 2016-06-30 DIAGNOSIS — M5416 Radiculopathy, lumbar region: Secondary | ICD-10-CM | POA: Diagnosis not present

## 2016-07-01 ENCOUNTER — Other Ambulatory Visit: Payer: Self-pay | Admitting: Family Medicine

## 2016-07-01 ENCOUNTER — Telehealth: Payer: Self-pay

## 2016-07-01 MED ORDER — FUROSEMIDE 20 MG PO TABS: 20.0000 mg | ORAL_TABLET | Freq: Every day | ORAL | 0 refills | Status: DC

## 2016-07-01 NOTE — Telephone Encounter (Signed)
Pt left v/m; pt started taking fluid pill on 06/29/16 in the morning; pt wt on 06/29/16 in late afternoon wt 164 lbs. On 06/30/16 wt 163 lbs and today pt wt is 161 lbs. Pt still having some puffiness or swelling at night but better than before. Pt request cb.

## 2016-07-01 NOTE — Progress Notes (Signed)
Let pt know I sent in 3 more days of diuretic to continue. Keep follow up as planned.

## 2016-07-01 NOTE — Telephone Encounter (Signed)
Let pt know I sent in 3 more days of diuretic to continue to [facility]. Keep follow up  With MDs as planned.

## 2016-07-01 NOTE — Telephone Encounter (Signed)
Shawn Lam notified as instructed by telephone.

## 2016-07-04 ENCOUNTER — Other Ambulatory Visit (INDEPENDENT_AMBULATORY_CARE_PROVIDER_SITE_OTHER): Payer: Medicare Other

## 2016-07-04 ENCOUNTER — Encounter: Payer: Self-pay | Admitting: Psychiatry

## 2016-07-04 ENCOUNTER — Ambulatory Visit (INDEPENDENT_AMBULATORY_CARE_PROVIDER_SITE_OTHER): Payer: Medicare Other | Admitting: Psychiatry

## 2016-07-04 ENCOUNTER — Encounter: Payer: Self-pay | Admitting: Cardiology

## 2016-07-04 VITALS — BP 113/69 | HR 85 | Temp 98.3°F | Wt 167.4 lb

## 2016-07-04 DIAGNOSIS — F39 Unspecified mood [affective] disorder: Secondary | ICD-10-CM

## 2016-07-04 DIAGNOSIS — R600 Localized edema: Secondary | ICD-10-CM | POA: Diagnosis not present

## 2016-07-04 DIAGNOSIS — I2510 Atherosclerotic heart disease of native coronary artery without angina pectoris: Secondary | ICD-10-CM

## 2016-07-04 DIAGNOSIS — M545 Low back pain: Secondary | ICD-10-CM | POA: Diagnosis not present

## 2016-07-04 DIAGNOSIS — M5416 Radiculopathy, lumbar region: Secondary | ICD-10-CM | POA: Diagnosis not present

## 2016-07-04 DIAGNOSIS — F411 Generalized anxiety disorder: Secondary | ICD-10-CM

## 2016-07-04 LAB — BASIC METABOLIC PANEL
BUN: 23 mg/dL (ref 6–23)
CO2: 29 meq/L (ref 19–32)
Calcium: 8.9 mg/dL (ref 8.4–10.5)
Chloride: 106 mEq/L (ref 96–112)
Creatinine, Ser: 1.39 mg/dL (ref 0.40–1.50)
GFR: 54.33 mL/min — ABNORMAL LOW (ref >60.00)
Glucose, Bld: 112 mg/dL — ABNORMAL HIGH (ref 70–99)
Potassium: 4.6 mEq/L (ref 3.5–5.1)
SODIUM: 141 meq/L (ref 135–145)

## 2016-07-04 MED ORDER — FLUOXETINE HCL 20 MG PO CAPS: 20.0000 mg | ORAL_CAPSULE | Freq: Every day | ORAL | 2 refills | Status: DC

## 2016-07-04 MED ORDER — MIRTAZAPINE 15 MG PO TABS
15.0000 mg | ORAL_TABLET | Freq: Every day | ORAL | 1 refills | Status: DC
Start: 2016-07-04 — End: 2016-08-31

## 2016-07-04 MED ORDER — DEPLIN 15 15-90.314 MG PO CAPS: 15.0000 mg | ORAL_CAPSULE | Freq: Every evening | ORAL | 4 refills | Status: DC

## 2016-07-04 MED ORDER — FLUOXETINE HCL 40 MG PO CAPS: 40.0000 mg | ORAL_CAPSULE | Freq: Every morning | ORAL | 1 refills | Status: DC

## 2016-07-04 NOTE — Progress Notes (Signed)
Psychiatric MD Progress Note   Patient Identification: Shawn Lam MRN:  [id number] Date of Evaluation:  07/04/2016 Referral Source: Stanton Kidney- Therapist Chief Complaint:   Chief Complaint    Follow-up; Medication Refill     Visit Diagnosis:    ICD-9-CM ICD-10-CM   1. Episodic mood disorder ([place]) 296.90 F39   2. Generalized anxiety disorder 300.02 F41.1     History of Present Illness:    Patient is a 66 year old married male who presented for follow-up. He reported that he has recently started using the CPAP machine after the sleep study was done. Patient also reported thatHe is having several medical problems as he has noticed swelling of his legs and he was diagnosed with edema. He was given Lasix and his primary care physician is in contact with his cardiologist. Patient reported that he has been having these problems since the pacemaker was placed due to his heart condition. He appeared anxious and apprehensive. He reported that the current medications are helping him. He is compliant with his medications.  He is also  taking Deplin and is getting it from the mail order pharmacy.  He currently denied having any perceptual disturbances. He was noted to be shaking his legs during the interview.   He only drinks 2 cups of caffeine during the daytime.    Associated Signs/Symptoms: Depression Symptoms:  depressed mood, insomnia, fatigue, anxiety, decreased appetite, (Hypo) Manic Symptoms:  Labiality of Mood, Anxiety Symptoms:  Excessive Worry, Psychotic Symptoms:  none PTSD Symptoms: Negative NA  Past Psychiatric History:  Pt reported history of anxiety and has been taking medications including Zoloft and Xanax prescribed by his primary care physician. He denied any previous history of psychiatric hospitalization. No history of suicide attempt.  Previous Psychotropic Medications: Xanax 0.5 mg by mouth twice a day Zoloft 100 mg by mouth twice a day   Substance Abuse  History in the last 12 months:  Yes.   3 beers/week  Consequences of Substance Abuse: Negative NA  Past Medical History:  Past Medical History:  Diagnosis Date  . Anemia   . Anxiety   . Asymptomatic LV dysfunction    mild with EF 42% by recent nuclear stress test.  . Berger's disease   . CAD (coronary artery disease) 06/2005   PCI of LAD and RCA  . Depression   . Hematuria   . Hyperlipidemia   . Insomnia   . Left ureteral stone   . Orthostatic hypotension    negative tilt table  . OSA (obstructive sleep apnea)    moderate with AHI 17/hr  . Renal disorder    PT IS SEEING DR. Lorrene Reid- NEPHROLOGIST  . Rosacea   . Urticaria     Past Surgical History:  Procedure Laterality Date  . CARDIAC CATHETERIZATION  09/06/2013   [place]  . CARDIAC CATHETERIZATION    . CORONARY ANGIOPLASTY  2004   STENT PLACEMENT  . CYSTOSCOPY WITH RETROGRADE PYELOGRAM, URETEROSCOPY AND STENT PLACEMENT Left 03/19/2014   Procedure: CYSTOSCOPY WITH RETROGRADE PYELOGRAM, URETEROSCOPY AND STENT PLACEMENT;  Surgeon: Junious Dresser, MD;  Location: [facility];  Service: Urology;  Laterality: Left;  . CYSTOSCOPY WITH RETROGRADE PYELOGRAM, URETEROSCOPY AND STENT PLACEMENT Bilateral 05/20/2015   Procedure:  CYSTOSCOPY WITH BILATERAL RETROGRADE PYELOGRAM,RIGHT  DIAGNOSTIC URETEROSCOPY ,LEFT URETEROSCOPY WITH HOLMIUM LASER  AND BILATERAL  STENT PLACEMENT ;  Surgeon: Alexis Frock, MD;  Location: [facility];  Service: Urology;  Laterality: Bilateral;  . EP IMPLANTABLE DEVICE N/A 05/13/2016   Procedure: Loop  Recorder Insertion;  Surgeon: Deboraha Sprang, MD;  Location: [facility];  Service: Cardiovascular;  Laterality: N/A;  . HOLMIUM LASER APPLICATION Bilateral 05/14/4823   Procedure: HOLMIUM LASER APPLICATION;  Surgeon: Alexis Frock, MD;  [facility]: [facility];  Service: Urology;  Laterality: Bilateral;  . LEFT HEART CATHETERIZATION WITH CORONARY ANGIOGRAM N/A 09/06/2013   Procedure: LEFT HEART CATHETERIZATION WITH CORONARY  ANGIOGRAM;  Surgeon: Sinclair Grooms, MD;  Location: [facility];  Service: Cardiovascular;  Laterality: N/A;  . LEFT KNEE CAP  SURGERY ABOUT 40 YRS AGO  ? 1970    . STONE EXTRACTION WITH BASKET Left 03/19/2014   Procedure: STONE EXTRACTION WITH BASKET;  Surgeon: Junious Dresser, MD;  Location: [facility];  Service: Urology;  Laterality: Left;    Family Psychiatric History:  Pt denied  Family History:  Family History  Problem Relation Age of Onset  . Coronary artery disease Mother   . Heart attack Mother   . Heart disease Mother   . Hypertension Mother   . Cancer Father     lung and colon  . Lung cancer Father     smoker  . Diabetes Brother     Social History:   Social History   Social History  . Marital status: Married    Spouse name: N/A  . Number of children: N/A  . Years of education: N/A   Occupational History  . retired Print production planner    Social History Main Topics  . Smoking status: Never Smoker  . Smokeless tobacco: Never Used  . Alcohol use 1.2 - 1.8 oz/week    2 - 3 Cans of beer per week  . Drug use: No  . Sexual activity: Yes   Other Topics Concern  . 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  Worked for [employer].   Allergies:   Allergies  Allergen Reactions  . Codeine Nausea Only  . Tape Other (See Comments)    Use only paper tape. Severe blistering and bruising with other tapes. No cloth tape.    Metabolic Disorder Labs: Lab Results  Component Value Date   HGBA1C 5.5 02/02/2016   No results found for: PROLACTIN Lab Results  Component Value Date   CHOL 156 02/02/2016   TRIG 43.0 02/02/2016   HDL 77.00 02/02/2016   CHOLHDL 2 02/02/2016   VLDL 8.6 02/02/2016   LDLCALC 71 02/02/2016   LDLCALC 70 01/27/2015     Current Medications: Current  Outpatient Prescriptions  Medication Sig Dispense Refill  . aspirin EC 81 MG tablet Take 81 mg by mouth at bedtime.     [name] atorvastatin (LIPITOR) 40 MG tablet TAKE 1 TABLET BY MOUTH DAILY. (Patient taking differently: Take 40mg s once daily in the morning) 90 tablet 3  . clopidogrel (PLAVIX) 75 MG tablet TAKE 1 TABLET (75 MG TOTAL) BY MOUTH DAILY. (Patient taking differently: Take 75 mg by mouth at bedtime. ) 90 tablet 3  . [employer] IRON 325 (65 Fe) MG tablet TAKE 1 TABLET BY MOUTH 3 TIMES DAILY WITH MEALS. 270 tablet 3  . cyclobenzaprine (FLEXERIL) 10 MG tablet     . fludrocortisone (FLORINEF) 0.1 MG tablet TAKE 1 TABLET (0.1 MG TOTAL) BY MOUTH DAILY. 90 tablet 0  .  FLUOCINOLONE ACETONIDE BODY 0.01 % OIL Apply 1 application topically daily as needed (for dry skin).     [name] FLUoxetine (PROZAC) 20 MG capsule Take 1 capsule (20 mg total) by mouth daily. 90 capsule 2  . FLUoxetine (PROZAC) 40 MG capsule Take 1 capsule (40 mg total) by mouth every morning. 90 capsule 1  . furosemide (LASIX) 20 MG tablet Take 1 tablet (20 mg total) by mouth daily. 3 tablet 0  . HYDROcodone-acetaminophen (NORCO/VICODIN) 5-325 MG tablet Take 1 tablet by mouth daily as needed for moderate pain or severe pain. 20 tablet 0  . Krill Oil 500 MG CAPS Take 500 mg by mouth daily.    [name] L-Methylfolate-Algae (DEPLIN 15) 15-90.314 MG CAPS Take 15 mg by mouth every evening. 30 capsule 4  . midodrine (PROAMATINE) 5 MG tablet TAKE 1 TABLET (5 MG TOTAL) BY MOUTH 2 (TWO) TIMES DAILY WITH BREAKFAST AND LUNCH. 180 tablet 3  . mirtazapine (REMERON) 15 MG tablet Take 1 tablet (15 mg total) by mouth at bedtime. 90 tablet 1  . Multiple Vitamins-Minerals (CENTRUM ADULTS PO) Take 1 tablet by mouth every morning.     [name] NITROSTAT 0.4 MG SL tablet PLACE 1 TABLET (0.4 MG TOTAL) UNDER THE TONGUE EVERY 5 (FIVE) MINUTES AS NEEDED FOR CHEST PAIN. 25 tablet 5  . vitamin B-12 (CYANOCOBALAMIN) 1000 MCG tablet Take 1,000 mcg by mouth at bedtime.      No current  facility-administered medications for this visit.     Neurologic: Headache: No Seizure: No Paresthesias:No  Musculoskeletal: Strength & Muscle Tone: within normal limits Gait & Station: normal Patient leans: N/A  Psychiatric Specialty Exam: ROS  Blood pressure 113/69, pulse 85, temperature 98.3 F (36.8 C), temperature source Oral, weight 167 lb 6.4 oz (75.9 kg).Body mass index is 22.7 kg/m.  General Appearance: Casual  Eye Contact:  Fair  Speech:  Clear and Coherent  Volume:  Normal  Mood:  Anxious  Affect:  Congruent  Thought Process:  Goal Directed  Orientation:  Full (Time, Place, and Person)  Thought Content:  WDL  Suicidal Thoughts:  No  Homicidal Thoughts:  No  Memory:  Immediate;   Fair Recent;   Fair Remote;   Fair  Judgement:  Fair  Insight:  Fair  Psychomotor Activity:  Normal  Concentration:  Concentration: Fair and Attention Span: Fair  Recall:  [employer] of Knowledge:Fair  Language: Fair  Akathisia:  No  Handed:  Right  AIMS (if indicated):    Assets:  Communication Skills Desire for Improvement Physical Health Social Support  ADL's:  Intact  Cognition: WNL  Sleep:  poor    Treatment Plan Summary: Medication management   Discussed with patient at length about the medications treatment risks benefits and alternatives.  Continue  Remeron 15 mg by mouth daily at bedtime to help with his anxiety insomnia and depression. Continue  Prozac 60 mg at this time to help with his anxiety.   He will follow-up in 2 months or earlier depending on his symptoms.  Follow-up in 2 months or earlier depending on his symptoms   More than 50% of the time spent in psychoeducation, counseling and coordination of care.    This note was generated in part or whole with voice recognition software. Voice regonition is usually quite accurate but there are transcription errors that can and very often do occur. I apologize for any typographical errors that were not  detected and corrected.   Rainey Pines, MD 3/26/20182:08 PM

## 2016-07-05 ENCOUNTER — Telehealth: Payer: Self-pay

## 2016-07-05 NOTE — Telephone Encounter (Signed)
rx for deplin 15  was faxed and confirmed.  id # [id number] order # [id number]

## 2016-07-07 ENCOUNTER — Ambulatory Visit (INDEPENDENT_AMBULATORY_CARE_PROVIDER_SITE_OTHER): Payer: Medicare Other | Admitting: Cardiology

## 2016-07-07 ENCOUNTER — Encounter: Payer: Self-pay | Admitting: Cardiology

## 2016-07-07 VITALS — BP 110/64 | HR 81 | Ht 72.0 in | Wt 169.0 lb

## 2016-07-07 DIAGNOSIS — I951 Orthostatic hypotension: Secondary | ICD-10-CM

## 2016-07-07 DIAGNOSIS — E7800 Pure hypercholesterolemia, unspecified: Secondary | ICD-10-CM | POA: Diagnosis not present

## 2016-07-07 DIAGNOSIS — M545 Low back pain: Secondary | ICD-10-CM | POA: Diagnosis not present

## 2016-07-07 DIAGNOSIS — S76112D Strain of left quadriceps muscle, fascia and tendon, subsequent encounter: Secondary | ICD-10-CM | POA: Diagnosis not present

## 2016-07-07 DIAGNOSIS — R55 Syncope and collapse: Secondary | ICD-10-CM | POA: Diagnosis not present

## 2016-07-07 DIAGNOSIS — I493 Ventricular premature depolarization: Secondary | ICD-10-CM

## 2016-07-07 DIAGNOSIS — I2510 Atherosclerotic heart disease of native coronary artery without angina pectoris: Secondary | ICD-10-CM | POA: Diagnosis not present

## 2016-07-07 HISTORY — DX: Ventricular premature depolarization: I49.3

## 2016-07-07 NOTE — Patient Instructions (Signed)
Medication Instructions:  1) STOP FLORINEF  Labwork: Your physician recommends that you return for FASTING lab work.  Testing/Procedures: None  Follow-Up: Your physician recommends that you schedule a follow-up appointment in 2 WEEKS with Dr. Theodosia Blender assistant.  Your physician wants you to follow-up in: 6 months with Dr. Radford Pax. You will receive a reminder letter in the mail two months in advance. If you don't receive a letter, please call our office to schedule the follow-up appointment.   Any Other Special Instructions Will Be Listed Below (If Applicable).     If you need a refill on your cardiac medications before your next appointment, please call your pharmacy.

## 2016-07-07 NOTE — Progress Notes (Signed)
Cardiology Office Note    Date:  07/07/2016   ID:  ROBERTLEE ROGACKI, DOB 1950-05-04, MRN [id number]  PCP:  [name], MD  Cardiologist:  Fransico Him, MD   Chief Complaint  Patient presents with  . Coronary Artery Disease  . Hyperlipidemia  . Sleep Apnea    History of Present Illness:  Shawn Lam is a 66 y.o. male with a history of ASCAD, dyslipidemia, low normal LVF with EF 50-55% with inferior HK and mild dilated aortic root at 87mm by echo 06/2016.  He recently complained of some sharp chest pain and nuclear stress test showed no ischemia.  He had a home sleep study showing moderate OSA with O2 sats dropping to 83% and an AHI of 17/hr.  He underwent CPAP titiration in January and was titrated to 10cm H2O. He is here today for followup.  He was recently seen by his PCP for severe LE swelling that occurred right after placement of LINQ recorder. There was no evidence of lymphatic obstruction and 2D echo was done which showed improvement in EF.  He was worked up for adrenal renal causes of edema which  were normal.  Albumin was mildly reduced at 3.3.   He had a full workup for n and denies any anginal chest pain, DOE,  claudication or syncope.  His dizziness is stable on Florinef and proamatine.He is doing well with his CPAP device.  He tolerates his full face mask with minimal leakage and feels the pressure is adequate.  He feels more rested in the am and does not nap during the day. His wife says that his snoring has improved.  He has some dry mouth but uses the humidity.     Past Medical History:  Diagnosis Date  . Anemia   . Anxiety   . Asymptomatic LV dysfunction    mild with EF 42% by recent nuclear stress test.  . Berger's disease   . CAD (coronary artery disease) 06/2005   PCI of LAD and RCA  . Depression   . Hematuria   . Hyperlipidemia   . Insomnia   . Left ureteral stone   . Orthostatic hypotension    negative tilt table  . OSA (obstructive sleep apnea)    moderate with AHI 17/hr  . PVC's (premature ventricular contractions) 07/07/2016  . Renal disorder    PT IS SEEING DR. Lorrene Reid- NEPHROLOGIST  . Rosacea   . Urticaria     Past Surgical History:  Procedure Laterality Date  . CARDIAC CATHETERIZATION  09/06/2013   [place]  . CARDIAC CATHETERIZATION    . CORONARY ANGIOPLASTY  2004   STENT PLACEMENT  . CYSTOSCOPY WITH RETROGRADE PYELOGRAM, URETEROSCOPY AND STENT PLACEMENT Left 03/19/2014   Procedure: CYSTOSCOPY WITH RETROGRADE PYELOGRAM, URETEROSCOPY AND STENT PLACEMENT;  Surgeon: Junious Dresser, MD;  [facility];  Service: Urology;  Laterality: Left;  . CYSTOSCOPY WITH RETROGRADE PYELOGRAM, URETEROSCOPY AND STENT PLACEMENT Bilateral 05/20/2015   Procedure:  CYSTOSCOPY WITH BILATERAL RETROGRADE PYELOGRAM,RIGHT  DIAGNOSTIC URETEROSCOPY ,LEFT URETEROSCOPY WITH HOLMIUM LASER  AND BILATERAL  STENT PLACEMENT ;  Surgeon: Alexis Frock, MD;  Location: [facility];  Service: Urology;  Laterality: Bilateral;  . EP IMPLANTABLE DEVICE N/A 05/13/2016   Procedure: Loop Recorder Insertion;  Surgeon: Deboraha Sprang, MD;  Location: [facility];  Service: Cardiovascular;  Laterality: N/A;  . HOLMIUM LASER APPLICATION Bilateral 06/17/1015   Procedure: HOLMIUM LASER APPLICATION;  Surgeon: Alexis Frock, MD;  [facility];  Service: Urology;  Laterality: Bilateral;  . LEFT HEART CATHETERIZATION WITH CORONARY ANGIOGRAM N/A 09/06/2013   Procedure: LEFT HEART CATHETERIZATION WITH CORONARY ANGIOGRAM;  Surgeon: Sinclair Grooms, MD;  Location: [facility];  Service: Cardiovascular;  Laterality: N/A;  . LEFT KNEE CAP  SURGERY ABOUT 40 YRS AGO  ? 1970    . STONE EXTRACTION WITH BASKET Left 03/19/2014   Procedure: STONE EXTRACTION WITH BASKET;  Surgeon: Junious Dresser, MD;  [facility];  Service: Urology;  Laterality: Left;    Current Medications: Current Meds  Medication Sig  . aspirin EC 81 MG tablet Take 81 mg by mouth at bedtime.   [name] atorvastatin  (LIPITOR) 40 MG tablet TAKE 1 TABLET BY MOUTH DAILY. (Patient taking differently: Take 40mg s once daily in the morning)  . clopidogrel (PLAVIX) 75 MG tablet TAKE 1 TABLET (75 MG TOTAL) BY MOUTH DAILY. (Patient taking differently: Take 75 mg by mouth at bedtime. )  . CVS IRON 325 (65 Fe) MG tablet TAKE 1 TABLET BY MOUTH 3 TIMES DAILY WITH MEALS.  . cyclobenzaprine (FLEXERIL) 10 MG tablet   . fludrocortisone (FLORINEF) 0.1 MG tablet TAKE 1 TABLET (0.1 MG TOTAL) BY MOUTH DAILY.  [name] FLUOCINOLONE ACETONIDE BODY 0.01 % OIL Apply 1 application topically daily as needed (for dry skin).   [name] FLUoxetine (PROZAC) 40 MG capsule Take 1 capsule (40 mg total) by mouth every morning.  . furosemide (LASIX) 20 MG tablet Take 1 tablet (20 mg total) by mouth daily.  [name] HYDROcodone-acetaminophen (NORCO/VICODIN) 5-325 MG tablet Take 1 tablet by mouth daily as needed for moderate pain or severe pain.  Javier Docker Oil 500 MG CAPS Take 500 mg by mouth daily.  [name] L-Methylfolate-Algae (DEPLIN 15) 15-90.314 MG CAPS Take 15 mg by mouth every evening.  . midodrine (PROAMATINE) 5 MG tablet TAKE 1 TABLET (5 MG TOTAL) BY MOUTH 2 (TWO) TIMES DAILY WITH BREAKFAST AND LUNCH.  . mirtazapine (REMERON) 15 MG tablet Take 1 tablet (15 mg total) by mouth at bedtime.  . Multiple Vitamins-Minerals (CENTRUM ADULTS PO) Take 1 tablet by mouth every morning.   [name] NITROSTAT 0.4 MG SL tablet PLACE 1 TABLET (0.4 MG TOTAL) UNDER THE TONGUE EVERY 5 (FIVE) MINUTES AS NEEDED FOR CHEST PAIN.  [name] vitamin B-12 (CYANOCOBALAMIN) 1000 MCG tablet Take 1,000 mcg by mouth at bedtime.   . [DISCONTINUED] FLUoxetine (PROZAC) 20 MG capsule Take 1 capsule (20 mg total) by mouth daily.    Allergies:   Codeine and Tape   Social History   Social History  . Marital status: Married    Spouse name: N/A  . Number of children: N/A  . Years of education: N/A   Occupational History  . retired Print production planner    Social History Main Topics  . Smoking status: Never Smoker  .  Smokeless tobacco: Never Used  . Alcohol use 1.2 - 1.8 oz/week    2 - 3 Cans of beer per week  . Drug use: No  . Sexual activity: Yes   Other Topics Concern  . None   Social History Narrative   Regular exercise--yes--jog 5 miles 6 days a week     Family History:  The patient's family history includes Cancer in his father; Coronary artery disease in his mother; Diabetes in his brother; Heart attack in his mother; Heart disease in his mother; Hypertension in his mother; Lung cancer in his father.   ROS:   Please see the history of present  illness.    ROS All other systems reviewed and are negative.  No flowsheet data found.     PHYSICAL EXAM:   VS:  BP 110/64   Pulse 81   Ht 6' (1.829 m)   Wt 169 lb (76.7 kg)   SpO2 99%   BMI 22.92 kg/m    GEN: Well nourished, well developed, in no acute distress  HEENT: normal  Neck: no JVD, carotid bruits, or masses Cardiac: RRR; no murmurs, rubs, or gallops,no edema.  Intact distal pulses bilaterally.  Respiratory:  clear to auscultation bilaterally, normal work of breathing GI: soft, nontender, nondistended, + BS MS: no deformity or atrophy  Skin: warm and dry, no rash Neuro:  Alert and Oriented x 3, Strength and sensation are intact Psych: euthymic mood, full affect  Wt Readings from Last 3 Encounters:  07/07/16 169 lb (76.7 kg)  06/28/16 170 lb 12 oz (77.5 kg)  06/07/16 178 lb 8 oz (81 kg)      Studies/Labs Reviewed:   EKG:  EKG is not ordered today.    Recent Labs: 06/03/2016: Hemoglobin 12.0; Platelets 215.0; Pro B Natriuretic peptide (BNP) 38.0 06/28/2016: ALT 28; TSH 4.90 07/04/2016: BUN 23; Creatinine, Ser 1.39; Potassium 4.6; Sodium 141   Lipid Panel    Component Value Date/Time   CHOL 156 02/02/2016 0752   TRIG 43.0 02/02/2016 0752   HDL 77.00 02/02/2016 0752   CHOLHDL 2 02/02/2016 0752   VLDL 8.6 02/02/2016 0752   LDLCALC 71 02/02/2016 0752   LDLDIRECT 57 06/22/2009 1228    Additional studies/ records  that were reviewed today include:  none    ASSESSMENT:    1. Atherosclerosis of native coronary artery of native heart without angina pectoris   2. Orthostatic hypotension   3. Pure hypercholesterolemia   4. PVC's (premature ventricular contractions)   5. Syncope and collapse      PLAN:  In order of problems listed above:  1. ASCAD with PCI of the LAD and RCA with no ischemia on recent nuclear stress test 02/2016 and inferior HK.  - he has no anginal symptoms.  He will continue ASA/statin/Plavix. 2. Orthostatic hypotension - he has not had any recent dizzy spells and is well controlled on proamatine and florinef 3. Hyperlipidemia - LDL goal < 70. Continue statin.  LDL at goal at 71.  I will repeat an FLP and ALT. 4. PVCs noted on event monitor.  Given his significant orthostatic hypotension and concern for adding a BB or CCB he was referred to EP.  Due to his history of mild DCM and syncope with PVCs on monitor, a LINQ was placed with no arrhythmias noted thus far.  5. Syncope - He has not had any reoccurence in and now is wearing a LINQ recorder and followed by Dr. Caryl Comes.  6. Edema - he has had problems with LE edema ever since his LINQ was placed.  Workup for adrenal or renal causes was unremarkable and BNP was normal.  2D echo showed low normal LVF.  He was treated by his PCP with lasix with some improvement but it is still there. Abdominal and pelvic CT showed lymphedema.  He still has LE edema which for the most part is non pitting and raises the question that this may truly be lymphedema.  It would be odd for him to have edema secondary to florinef as he has been on this for years and again it is non pitting.  I have discussed this with  Dr. Josefa Half, his PCP, and at this time I will have him hold his Florinef and see him back in 2 weeks.  If his edema has not improved then will refer to lymphedema clinic. LE dopplers negative for DVT. 7.   OSA - the patient is tolerating PAP therapy well  without any problems. The PAP download was reviewed today and showed an AHI of 5/hr on 10 cm H2O with 97% compliance in using more than 4 hours nightly.  The patient has been using and benefiting from CPAP use and will continue to benefit from therapy.    Medication Adjustments/Labs and Tests Ordered: Current medicines are reviewed at length with the patient today.  Concerns regarding medicines are outlined above.  Medication changes, Labs and Tests ordered today are listed in the Patient Instructions below.  There are no Patient Instructions on file for this visit.   Signed, Fransico Him, MD  07/07/2016 2:19 PM    [facility] [address], [place], [place]  [address] Phone: [phone]; Fax: [phone]

## 2016-07-11 ENCOUNTER — Other Ambulatory Visit: Payer: Self-pay | Admitting: Psychiatry

## 2016-07-11 DIAGNOSIS — M545 Low back pain: Secondary | ICD-10-CM | POA: Diagnosis not present

## 2016-07-11 DIAGNOSIS — M5416 Radiculopathy, lumbar region: Secondary | ICD-10-CM | POA: Diagnosis not present

## 2016-07-12 ENCOUNTER — Ambulatory Visit (INDEPENDENT_AMBULATORY_CARE_PROVIDER_SITE_OTHER): Payer: Medicare Other | Admitting: *Deleted

## 2016-07-12 DIAGNOSIS — R002 Palpitations: Secondary | ICD-10-CM | POA: Diagnosis not present

## 2016-07-12 NOTE — Progress Notes (Signed)
[facility] Summary Report / Loop Recorder 

## 2016-07-14 DIAGNOSIS — M5416 Radiculopathy, lumbar region: Secondary | ICD-10-CM | POA: Diagnosis not present

## 2016-07-14 DIAGNOSIS — M545 Low back pain: Secondary | ICD-10-CM | POA: Diagnosis not present

## 2016-07-14 LAB — CUP PACEART REMOTE DEVICE CHECK
Date Time Interrogation Session: 20180403143620
MDC IDC PG IMPLANT DT: 20180202

## 2016-07-18 ENCOUNTER — Ambulatory Visit: Payer: Self-pay | Admitting: Psychiatry

## 2016-07-18 DIAGNOSIS — R609 Edema, unspecified: Secondary | ICD-10-CM | POA: Diagnosis not present

## 2016-07-18 DIAGNOSIS — D721 Eosinophilia: Secondary | ICD-10-CM | POA: Diagnosis not present

## 2016-07-18 DIAGNOSIS — Z95818 Presence of other cardiac implants and grafts: Secondary | ICD-10-CM | POA: Diagnosis not present

## 2016-07-18 DIAGNOSIS — Z6833 Body mass index (BMI) 33.0-33.9, adult: Secondary | ICD-10-CM | POA: Diagnosis not present

## 2016-07-18 DIAGNOSIS — I2510 Atherosclerotic heart disease of native coronary artery without angina pectoris: Secondary | ICD-10-CM | POA: Diagnosis not present

## 2016-07-18 DIAGNOSIS — N183 Chronic kidney disease, stage 3 (moderate): Secondary | ICD-10-CM | POA: Diagnosis not present

## 2016-07-18 DIAGNOSIS — I951 Orthostatic hypotension: Secondary | ICD-10-CM | POA: Diagnosis not present

## 2016-07-18 DIAGNOSIS — N200 Calculus of kidney: Secondary | ICD-10-CM | POA: Diagnosis not present

## 2016-07-18 DIAGNOSIS — E785 Hyperlipidemia, unspecified: Secondary | ICD-10-CM | POA: Diagnosis not present

## 2016-07-19 ENCOUNTER — Ambulatory Visit (INDEPENDENT_AMBULATORY_CARE_PROVIDER_SITE_OTHER): Payer: Medicare Other | Admitting: Physician Assistant

## 2016-07-19 ENCOUNTER — Other Ambulatory Visit: Payer: Medicare Other | Admitting: *Deleted

## 2016-07-19 ENCOUNTER — Telehealth: Payer: Self-pay

## 2016-07-19 ENCOUNTER — Encounter: Payer: Self-pay | Admitting: Physician Assistant

## 2016-07-19 VITALS — BP 126/78 | HR 72 | Ht 72.0 in | Wt 170.0 lb

## 2016-07-19 DIAGNOSIS — I951 Orthostatic hypotension: Secondary | ICD-10-CM

## 2016-07-19 DIAGNOSIS — E7800 Pure hypercholesterolemia, unspecified: Secondary | ICD-10-CM | POA: Diagnosis not present

## 2016-07-19 DIAGNOSIS — I2510 Atherosclerotic heart disease of native coronary artery without angina pectoris: Secondary | ICD-10-CM | POA: Diagnosis not present

## 2016-07-19 DIAGNOSIS — R55 Syncope and collapse: Secondary | ICD-10-CM | POA: Diagnosis not present

## 2016-07-19 DIAGNOSIS — I890 Lymphedema, not elsewhere classified: Secondary | ICD-10-CM

## 2016-07-19 DIAGNOSIS — I493 Ventricular premature depolarization: Secondary | ICD-10-CM

## 2016-07-19 DIAGNOSIS — N200 Calculus of kidney: Secondary | ICD-10-CM | POA: Diagnosis not present

## 2016-07-19 LAB — HEPATIC FUNCTION PANEL
ALT: 29 IU/L (ref 0–44)
AST: 41 IU/L — ABNORMAL HIGH (ref 0–40)
Albumin: 3.5 g/dL — ABNORMAL LOW (ref 3.6–4.8)
Alkaline Phosphatase: 117 IU/L (ref 39–117)
BILIRUBIN TOTAL: 0.3 mg/dL (ref 0.0–1.2)
Bilirubin, Direct: 0.08 mg/dL (ref 0.00–0.40)
TOTAL PROTEIN: 5.2 g/dL — AB (ref 6.0–8.5)

## 2016-07-19 LAB — LIPID PANEL
Chol/HDL Ratio: 2.1 ratio (ref 0.0–5.0)
Cholesterol, Total: 145 mg/dL (ref 100–199)
HDL: 70 mg/dL (ref >39)
LDL CALC: 63 mg/dL (ref 0–99)
Triglycerides: 60 mg/dL (ref 0–149)
VLDL CHOLESTEROL CAL: 12 mg/dL (ref 5–40)

## 2016-07-19 MED ORDER — FLUDROCORTISONE ACETATE 0.1 MG PO TABS: 0.1000 mg | ORAL_TABLET | Freq: Every day | ORAL | 11 refills | Status: DC

## 2016-07-19 MED ORDER — FUROSEMIDE 20 MG PO TABS: 20.0000 mg | ORAL_TABLET | Freq: Every day | ORAL | 3 refills | Status: DC

## 2016-07-19 NOTE — Patient Instructions (Addendum)
Medication Instructions:  1. START LASIX 20 MG DAILY; RX SENT  2.  START FLORINEF 0.1 MG DAILY; RX SENT  Labwork: IN 2 WEEKS YOU WILL NEED A BMET  Testing/Procedures: NONE ORDERED  Follow-Up: 1. DR. TURNER IN 1 MONTH  2. YOU ARE BEING REFERRED TO THE [place]; DR. Theodosia Blender NURSE [name] KEMP, RN WILL CALL YOU WILL THE INFORMATION.   Any Other Special Instructions Will Be Listed Below (If Applicable).  YOU HAVE BEEN GIVEN AN RX FOR COMPRESSION STOCKINGS    If you need a refill on your cardiac medications before your next appointment, please call your pharmacy.

## 2016-07-19 NOTE — Progress Notes (Signed)
Cardiology Office Note    Date:  07/19/2016   ID:  Shawn Lam, DOB [birth year], MRN [id number]  PCP:  [name], MD  Cardiologist: Dr. Radford Pax  Chief Complaint  Patient presents with  . Follow-up    History of Present Illness:  Shawn Lam is a 67 y.o. male with a history of ASCAD, dyslipidemia, low normal LVF with EF 50-55% with inferior HK and mild dilated aortic root at 71mm by echo 06/2016.  He recently complained of some sharp chest pain and nuclear stress test showed no ischemia.  He had a home sleep study showing moderate OSA with O2 sats dropping to 83% and an AHI of 17/hr.  He underwent CPAP titiration in January and was titrated to 10cm H2O. PVCs were noted on event monitor and given his significant orthostatic hypotension there was concern for adding beta blocker or calcium channel blocker and he was referred to EP.  Patient saw Dr. Radford Pax 07/07/16 because of lower extremity edema that occurred right after placement of a LINQ recorder. There was no evidence of lymphatic obstruction and 2-D echo was done showed improvement in EF. Adrenal and renal cause of edema workup was normal. BNP was normal. Abdominal and pelvic CT showed lymphedema. She thought it would be unusual for him to have edema secondary to Florinef as he's been on for years but after discussing with his PCP they decided to hold his Florinef for 2 weeks to see if it improves. If not refer to lymphedema clinic.  Patient comes in today for follow-up. He says stopping the Florinef did not make any difference. He is still having swelling up to his abdomen. He saw his renal doctor yesterday and that has been stable. The patient thinks Lasix that he took for a total of 6 days helped more than anything. Patient has also been an avid runner running 10 miles daily but had to stop 5 weeks ago because of sciatica and back pain. He is doing physical therapy. Patient also states he had lymph nodes come up under his arm and was  given an antibiotic and these resolved. He now has a lymph node that came up on his hand. I asked him to follow-up with his primary care concerning this but I wonder if he needs referred to heme/onc   Past Medical History:  Diagnosis Date  . Anemia   . Anxiety   . Asymptomatic LV dysfunction    mild with EF 42% by recent nuclear stress test.  . Berger's disease   . CAD (coronary artery disease) 06/2005   PCI of LAD and RCA  . Depression   . Hematuria   . Hyperlipidemia   . Insomnia   . Left ureteral stone   . Orthostatic hypotension    negative tilt table  . OSA (obstructive sleep apnea)    moderate with AHI 17/hr  . PVC's (premature ventricular contractions) 07/07/2016  . Renal disorder    PT IS SEEING DR. Lorrene Reid- NEPHROLOGIST  . Rosacea   . Urticaria     Past Surgical History:  Procedure Laterality Date  . CARDIAC CATHETERIZATION  09/06/2013   [place]  . CARDIAC CATHETERIZATION    . CORONARY ANGIOPLASTY  2004   STENT PLACEMENT  . CYSTOSCOPY WITH RETROGRADE PYELOGRAM, URETEROSCOPY AND STENT PLACEMENT Left 03/19/2014   Procedure: CYSTOSCOPY WITH RETROGRADE PYELOGRAM, URETEROSCOPY AND STENT PLACEMENT;  Surgeon: Junious Dresser, MD;  Location: [facility];  Service: Urology;  Laterality: Left;  . CYSTOSCOPY  WITH RETROGRADE PYELOGRAM, URETEROSCOPY AND STENT PLACEMENT Bilateral 05/20/2015   Procedure:  CYSTOSCOPY WITH BILATERAL RETROGRADE PYELOGRAM,RIGHT  DIAGNOSTIC URETEROSCOPY ,LEFT URETEROSCOPY WITH HOLMIUM LASER  AND BILATERAL  STENT PLACEMENT ;  Surgeon: Alexis Frock, MD;  Location: [facility];  Service: Urology;  Laterality: Bilateral;  . EP IMPLANTABLE DEVICE N/A 05/13/2016   Procedure: Loop Recorder Insertion;  Surgeon: Deboraha Sprang, MD;  Location: [facility];  Service: Cardiovascular;  Laterality: N/A;  . HOLMIUM LASER APPLICATION Bilateral 0/04/7508   Procedure: HOLMIUM LASER APPLICATION;  Surgeon: Alexis Frock, MD;  Location: [facility];  Service: Urology;  Laterality:  Bilateral;  . LEFT HEART CATHETERIZATION WITH CORONARY ANGIOGRAM N/A 09/06/2013   Procedure: LEFT HEART CATHETERIZATION WITH CORONARY ANGIOGRAM;  Surgeon: Sinclair Grooms, MD;  Location: [facility];  Service: Cardiovascular;  Laterality: N/A;  . LEFT KNEE CAP  SURGERY ABOUT 40 YRS AGO  ? 1970    . STONE EXTRACTION WITH BASKET Left 03/19/2014   Procedure: STONE EXTRACTION WITH BASKET;  Surgeon: Junious Dresser, MD;  Location: [facility];  Service: Urology;  Laterality: Left;    Current Medications: Outpatient Medications Prior to Visit  Medication Sig Dispense Refill  . aspirin EC 81 MG tablet Take 81 mg by mouth at bedtime.     [name] atorvastatin (LIPITOR) 40 MG tablet TAKE 1 TABLET BY MOUTH DAILY. (Patient taking differently: Take 40mg s once daily in the morning) 90 tablet 3  . clopidogrel (PLAVIX) 75 MG tablet TAKE 1 TABLET (75 MG TOTAL) BY MOUTH DAILY. (Patient taking differently: Take 75 mg by mouth at bedtime. ) 90 tablet 3  . [employer] IRON 325 (65 Fe) MG tablet TAKE 1 TABLET BY MOUTH 3 TIMES DAILY WITH MEALS. 270 tablet 3  . cyclobenzaprine (FLEXERIL) 10 MG tablet     . FLUOCINOLONE ACETONIDE BODY 0.01 % OIL Apply 1 application topically daily as needed (for dry skin).     [name] FLUoxetine (PROZAC) 40 MG capsule Take 1 capsule (40 mg total) by mouth every morning. 90 capsule 1  . Krill Oil 500 MG CAPS Take 500 mg by mouth daily.    [name] L-Methylfolate-Algae (DEPLIN 15) 15-90.314 MG CAPS Take 15 mg by mouth every evening. 30 capsule 4  . midodrine (PROAMATINE) 5 MG tablet TAKE 1 TABLET (5 MG TOTAL) BY MOUTH 2 (TWO) TIMES DAILY WITH BREAKFAST AND LUNCH. 180 tablet 3  . mirtazapine (REMERON) 15 MG tablet Take 1 tablet (15 mg total) by mouth at bedtime. 90 tablet 1  . Multiple Vitamins-Minerals (CENTRUM ADULTS PO) Take 1 tablet by mouth every morning.     [name] NITROSTAT 0.4 MG SL tablet PLACE 1 TABLET (0.4 MG TOTAL) UNDER THE TONGUE EVERY 5 (FIVE) MINUTES AS NEEDED FOR CHEST PAIN. 25 tablet 5  . vitamin B-12  (CYANOCOBALAMIN) 1000 MCG tablet Take 1,000 mcg by mouth at bedtime.     . furosemide (LASIX) 20 MG tablet Take 1 tablet (20 mg total) by mouth daily. 3 tablet 0  . HYDROcodone-acetaminophen (NORCO/VICODIN) 5-325 MG tablet Take 1 tablet by mouth daily as needed for moderate pain or severe pain. (Patient not taking: Reported on 07/19/2016) 20 tablet 0   No facility-administered medications prior to visit.      Allergies:   Codeine and Tape   Social History   Social History  . Marital status: Married    Spouse name: N/A  . Number of children: N/A  . Years of education: N/A   Occupational History  .  retired Print production planner    Social History Main Topics  . Smoking status: Never Smoker  . Smokeless tobacco: Never Used  . Alcohol use 1.2 - 1.8 oz/week    2 - 3 Cans of beer per week  . Drug use: No  . Sexual activity: Yes   Other Topics Concern  . None   Social History Narrative   Regular exercise--yes--jog 5 miles 6 days a week     Family History:  The patient's   family history includes Cancer in his father; Coronary artery disease in his mother; Diabetes in his brother; Heart attack in his mother; Heart disease in his mother; Hypertension in his mother; Lung cancer in his father.   ROS:   Please see the history of present illness.    Review of Systems  Constitution: Positive for weight gain.  HENT: Negative.   Cardiovascular: Positive for irregular heartbeat, leg swelling and palpitations.  Respiratory: Negative.   Endocrine: Negative.   Hematologic/Lymphatic: Bruises/bleeds easily.  Musculoskeletal: Positive for back pain and myalgias.  Gastrointestinal: Negative.   Genitourinary: Negative.   Neurological: Negative.    All other systems reviewed and are negative.   PHYSICAL EXAM:   VS:  BP 126/78 (BP Location: Right Arm, Patient Position: Sitting, Cuff Size: Normal)   Pulse 72   Ht 6' (1.829 m)   Wt 170 lb (77.1 kg)   BMI 23.06 kg/m   Physical Exam  GEN: Well  nourished, well developed, in no acute distress  Neck: no JVD, carotid bruits, or masses Cardiac:RRR; no murmurs, rubs, or gallops  Respiratory:  clear to auscultation bilaterally, normal work of breathing GI: soft, nontender, nondistended, + BS Ext: +1-2 edema bilaterally up past his knees without cyanosis,  Good distal pulses bilaterally Psych: euthymic mood, full affect  Wt Readings from Last 3 Encounters:  07/19/16 170 lb (77.1 kg)  07/07/16 169 lb (76.7 kg)  06/28/16 170 lb 12 oz (77.5 kg)      Studies/Labs Reviewed:   EKG:  EKG is  ordered today.  The ekg ordered today demonstrates  Normal sinus rhythm with short otherwise normal   Recent Labs: 06/03/2016: Hemoglobin 12.0; Platelets 215.0; Pro B Natriuretic peptide (BNP) 38.0 06/28/2016: ALT 28; TSH 4.90 07/04/2016: BUN 23; Creatinine, Ser 1.39; Potassium 4.6; Sodium 141   Lipid Panel    Component Value Date/Time   CHOL 156 02/02/2016 0752   TRIG 43.0 02/02/2016 0752   HDL 77.00 02/02/2016 0752   CHOLHDL 2 02/02/2016 0752   VLDL 8.6 02/02/2016 0752   LDLCALC 71 02/02/2016 0752   LDLDIRECT 57 06/22/2009 1228    Additional studies/ records that were reviewed today include:  2-D echo 06/24/16 Study Conclusions   - Left ventricle: The cavity size was normal. There was mild   concentric hypertrophy. Systolic function was normal. The   estimated ejection fraction was in the range of 50% to 55%. There   is hypokinesis of the entireinferior myocardium. Left ventricular   diastolic function parameters were normal. - Aortic valve: Trileaflet; mildly thickened, mildly calcified   leaflets. - Aorta: Aortic root dimension: 38 mm (ED). - Aortic root: The aortic root was mildly dilated. - Mitral valve: Calcified annulus as well as calcified papillatry   muscle There was trivial regurgitation. - Tricuspid valve: There was trivial regurgitation.   Nuclear stress test 11/14/17Study Highlights     Nuclear stress EF:  42%.  Blood pressure demonstrated a normal response to exercise.  There was no ST segment  deviation noted during stress.  Defect 1: There is a medium defect of moderate severity present in the basal inferior and mid inferior location.  The left ventricular ejection fraction is moderately decreased (30-44%).  This is a low risk study.   Low risk stress nuclear study with inferior thinning vs prior infarct; no ischemia; EF 42 with global hypokinesis and moderate LVE.        ASSESSMENT:    1. PVC's (premature ventricular contractions)   2. Lymphedema   3. Syncope and collapse   4. Orthostatic hypotension   5. Atherosclerosis of native coronary artery of native heart without angina pectoris      PLAN:  In order of problems listed above:   Lymphedema patient states he is up about 20 pounds but he did have to stop running and he was running 10 miles daily. I don't believe his gut 20 pounds of edema today. He did have improvement when he was on Lasix. Will start Lasix 20 mg once daily, resume Florinef 0.1 mg daily. If he has a lot of dizziness or orthostasis he is to stop the Lasix. Check renal function in 2 weeks. Compression stockings. Refer to lymphedema clinic. Follow-up with Dr. Radford Pax in one month.  History of syncope and collapse no arrhythmias identified on device transmission 07/12/16.   Orthostatic hypotension We'll resume Florinef since stopping this did not improve his lymphedema.  CAD status post PCI the LAD and RCA with no ischemia on recent nuclear stress test 02/2016 with inferior hypokinesis. No angina. Continue aspirin/statin/Plavix  Medication Adjustments/Labs and Tests Ordered: Current medicines are reviewed at length with the patient today.  Concerns regarding medicines are outlined above.  Medication changes, Labs and Tests ordered today are listed in the Patient Instructions below. Patient Instructions  Medication Instructions:  1. START LASIX 20 MG DAILY; RX  SENT 2.  START FLORINEF 0.1 MG DAILY; RX SENT  Labwork: IN 2 WEEKS YOU WILL NEED A BMET  Testing/Procedures: NONE ORDERED  Follow-Up: 1. DR. TURNER IN 1 MONTH  2. YOU ARE BEING REFERRED TO THE [place] [facility]  Any Other Special Instructions Will Be Listed Below (If Applicable).  YOU HAVE BEEN GIVEN AN RX FOR COMPRESSION STOCKINGS    If you need a refill on your cardiac medications before your next appointment, please call your pharmacy.      Sumner Boast, PA-C  07/19/2016 11:46 AM    [facility] [address], [place] [place], [place]  [address] Phone: [phone]; Fax: [phone]

## 2016-07-19 NOTE — Telephone Encounter (Signed)
-----   Message from Imogene Burn, PA-C sent at 07/19/2016 11:50 AM EDT ----- Jannette Spanner, please refer to lymphedema clinic

## 2016-07-19 NOTE — Telephone Encounter (Signed)
Message sent to PT Department for instructions on how to correctly place Lymphedema referral.

## 2016-07-20 ENCOUNTER — Telehealth (HOSPITAL_COMMUNITY): Payer: Self-pay | Admitting: Physical Therapy

## 2016-07-20 NOTE — Telephone Encounter (Signed)
PT referral placed by Arbie Cookey, CMA.   Awaiting scheduling.

## 2016-07-20 NOTE — Addendum Note (Signed)
Addended by: Michae Kava on: 07/20/2016 09:28 AM   Modules accepted: Orders

## 2016-07-20 NOTE — Telephone Encounter (Signed)
I had called pt l/m to schedule for lymph eval, pt called back states he lives in [place] and wants to go to [place]. I called  and confirmed they could see pt. Called pt back and informed him that [facility] would call him to schedule.

## 2016-07-21 DIAGNOSIS — M545 Low back pain: Secondary | ICD-10-CM | POA: Diagnosis not present

## 2016-07-21 DIAGNOSIS — N183 Chronic kidney disease, stage 3 (moderate): Secondary | ICD-10-CM | POA: Diagnosis not present

## 2016-07-21 DIAGNOSIS — M5416 Radiculopathy, lumbar region: Secondary | ICD-10-CM | POA: Diagnosis not present

## 2016-07-21 DIAGNOSIS — D631 Anemia in chronic kidney disease: Secondary | ICD-10-CM | POA: Diagnosis not present

## 2016-07-25 DIAGNOSIS — M5416 Radiculopathy, lumbar region: Secondary | ICD-10-CM | POA: Diagnosis not present

## 2016-07-25 DIAGNOSIS — M545 Low back pain: Secondary | ICD-10-CM | POA: Diagnosis not present

## 2016-07-26 DIAGNOSIS — R34 Anuria and oliguria: Secondary | ICD-10-CM | POA: Diagnosis not present

## 2016-07-26 DIAGNOSIS — R8299 Other abnormal findings in urine: Secondary | ICD-10-CM | POA: Diagnosis not present

## 2016-07-26 DIAGNOSIS — N200 Calculus of kidney: Secondary | ICD-10-CM | POA: Diagnosis not present

## 2016-07-28 DIAGNOSIS — M5416 Radiculopathy, lumbar region: Secondary | ICD-10-CM | POA: Diagnosis not present

## 2016-07-28 DIAGNOSIS — M545 Low back pain: Secondary | ICD-10-CM | POA: Diagnosis not present

## 2016-07-29 ENCOUNTER — Other Ambulatory Visit (HOSPITAL_COMMUNITY): Payer: Self-pay | Admitting: *Deleted

## 2016-08-01 ENCOUNTER — Ambulatory Visit (HOSPITAL_COMMUNITY)
Admission: RE | Admit: 2016-08-01 | Discharge: 2016-08-01 | Disposition: A | Discharge status: Home / Self Care | Payer: Medicare Other | Source: Ambulatory Visit | Attending: Nephrology | Admitting: Nephrology

## 2016-08-01 DIAGNOSIS — N189 Chronic kidney disease, unspecified: Secondary | ICD-10-CM | POA: Diagnosis not present

## 2016-08-01 DIAGNOSIS — D631 Anemia in chronic kidney disease: Secondary | ICD-10-CM | POA: Insufficient documentation

## 2016-08-01 MED ORDER — SODIUM CHLORIDE 0.9 % IV SOLN
510.0000 mg | Freq: Once | INTRAVENOUS | Status: AC
  Administered 2016-08-01: 08:00:00 510 mg via INTRAVENOUS
  Filled 2016-08-01: qty 17

## 2016-08-01 NOTE — Discharge Instructions (Signed)

## 2016-08-02 ENCOUNTER — Other Ambulatory Visit: Payer: Medicare Other | Admitting: *Deleted

## 2016-08-02 DIAGNOSIS — I890 Lymphedema, not elsewhere classified: Secondary | ICD-10-CM | POA: Diagnosis not present

## 2016-08-02 LAB — BASIC METABOLIC PANEL
BUN/Creatinine Ratio: 13 (ref 10–24)
BUN: 18 mg/dL (ref 8–27)
CO2: 22 mmol/L (ref 18–29)
CREATININE: 1.42 mg/dL — AB (ref 0.76–1.27)
Calcium: 8.8 mg/dL (ref 8.6–10.2)
Chloride: 103 mmol/L (ref 96–106)
GFR, EST AFRICAN AMERICAN: 59 mL/min/{1.73_m2} — AB (ref >59)
GFR, EST NON AFRICAN AMERICAN: 51 mL/min/{1.73_m2} — AB (ref >59)
Glucose: 143 mg/dL — ABNORMAL HIGH (ref 65–99)
POTASSIUM: 4.6 mmol/L (ref 3.5–5.2)
SODIUM: 142 mmol/L (ref 134–144)

## 2016-08-03 ENCOUNTER — Encounter: Payer: Self-pay | Admitting: Family Medicine

## 2016-08-08 DIAGNOSIS — M545 Low back pain: Secondary | ICD-10-CM | POA: Diagnosis not present

## 2016-08-11 ENCOUNTER — Ambulatory Visit (INDEPENDENT_AMBULATORY_CARE_PROVIDER_SITE_OTHER): Payer: Medicare Other | Admitting: *Deleted

## 2016-08-11 ENCOUNTER — Encounter: Payer: Self-pay | Admitting: Family Medicine

## 2016-08-11 ENCOUNTER — Ambulatory Visit (INDEPENDENT_AMBULATORY_CARE_PROVIDER_SITE_OTHER): Payer: Medicare Other | Admitting: Family Medicine

## 2016-08-11 VITALS — BP 116/62 | HR 82 | Temp 98.2°F | Ht 72.0 in | Wt 161.5 lb

## 2016-08-11 DIAGNOSIS — R7303 Prediabetes: Secondary | ICD-10-CM | POA: Diagnosis not present

## 2016-08-11 DIAGNOSIS — I2510 Atherosclerotic heart disease of native coronary artery without angina pectoris: Secondary | ICD-10-CM

## 2016-08-11 DIAGNOSIS — R002 Palpitations: Secondary | ICD-10-CM | POA: Diagnosis not present

## 2016-08-11 DIAGNOSIS — I890 Lymphedema, not elsewhere classified: Secondary | ICD-10-CM | POA: Diagnosis not present

## 2016-08-11 DIAGNOSIS — N028 Recurrent and persistent hematuria with other morphologic changes: Secondary | ICD-10-CM | POA: Diagnosis not present

## 2016-08-11 LAB — BASIC METABOLIC PANEL
BUN: 27 mg/dL — AB (ref 6–23)
CHLORIDE: 107 meq/L (ref 96–112)
CO2: 30 mEq/L (ref 19–32)
Calcium: 9.5 mg/dL (ref 8.4–10.5)
Creatinine, Ser: 1.72 mg/dL — ABNORMAL HIGH (ref 0.40–1.50)
GFR: 42.48 mL/min — AB (ref >60.00)
GLUCOSE: 103 mg/dL — AB (ref 70–99)
POTASSIUM: 5 meq/L (ref 3.5–5.1)
Sodium: 144 mEq/L (ref 135–145)

## 2016-08-11 LAB — HEMOGLOBIN A1C: Hgb A1c MFr Bld: 5.1 % (ref 4.6–6.5)

## 2016-08-11 NOTE — Assessment & Plan Note (Signed)
Almost enitrly resolved on lasi daily, but likely return if decrease dose. Unclear cause. Encouraged pt to keep appt with lymphedema clinic if not able to continue current dose of lasix given decline in renal function. Await BMET.

## 2016-08-11 NOTE — Progress Notes (Signed)
Pre visit review using our clinic review tool, if applicable. No additional management support is needed unless otherwise documented below in the visit note. 

## 2016-08-11 NOTE — Progress Notes (Signed)
[facility] Summary Report / Loop Recorder 

## 2016-08-11 NOTE — Assessment & Plan Note (Signed)
Will recheck cr as decreased 2 weks ago on lasix daily. MAy need to decrease use.

## 2016-08-11 NOTE — Patient Instructions (Addendum)
Please stop at the lab to set up to have labs drawn. Try to get back to regular exercise.  Work on low [employer].

## 2016-08-11 NOTE — Progress Notes (Signed)
   Subjective:    Patient ID: Shawn Lam, male    DOB: Jul 06, 1950, 66 y.o.   MRN: [id number]  HPI  66 year old male with history of prediabetes presents for follow up elevated blood sugar noted with labs done at cardiology office. On 4/24 cbg was 142, fasting. Diet: eats fairly low carb diet.[name] Has been much less active than usual in last several months.   He had to struggle with swelling in bilateral legs, but it has improved in last several weeks. He has lost 10 lbs in last 2 weeks on lasix 20 mg daily.   Last BMET showed slight worsening of creatinine to 1.42.  He has an upcoming appt at the lymphedema clinic on may 9th as no other secondary cause of peripheral edema has been found.  he does not want to go to lymphedema clinic, now that swelling is better on lasix. Wt Readings from Last 3 Encounters:  08/11/16 161 lb 8 oz (73.3 kg)  08/01/16 170 lb (77.1 kg)  07/19/16 170 lb (77.1 kg)   BP Readings from Last 3 Encounters:  08/11/16 116/62  08/01/16 125/62  07/19/16 126/78   He has recently restarted exercising as well.  Pain in thigh has resolved. Axillary lymphadenopathy resolved.  Has area in left palm that has been presents a year.. Consistent with tendon sheath cyst.  Review of Systems  Constitutional: Positive for fatigue. Negative for fever.  HENT: Negative for ear pain.   Eyes: Negative for pain.  Respiratory: Negative for cough and shortness of breath.   Cardiovascular: Positive for leg swelling. Negative for chest pain and palpitations.  Gastrointestinal: Negative for abdominal pain.  Genitourinary: Negative for dysuria.  Musculoskeletal: Negative for arthralgias.  Neurological: Negative for syncope, light-headedness and headaches.  Psychiatric/Behavioral: Negative for dysphoric mood.       Objective:   Physical Exam  Constitutional: Vital signs are normal. He appears well-developed and well-nourished.  HENT:  Head: Normocephalic.  Right Ear: Hearing  normal.  Left Ear: Hearing normal.  Nose: Nose normal.  Mouth/Throat: Oropharynx is clear and moist and mucous membranes are normal.  Neck: Trachea normal. Carotid bruit is not present. No thyroid mass and no thyromegaly present.  Cardiovascular: Normal rate, regular rhythm and normal pulses.  Exam reveals no gallop, no distant heart sounds and no friction rub.   No murmur heard. 1 plus nonpitting peripheral edema  Pulmonary/Chest: Effort normal and breath sounds normal. No respiratory distress.  Skin: Skin is warm, dry and intact. No rash noted.  Psychiatric: He has a normal mood and affect. His speech is normal and behavior is normal. Thought content normal.          Assessment & Plan:

## 2016-08-11 NOTE — Assessment & Plan Note (Signed)
Likely progression of prediabetes to diabetes given pt recent inactivity and strong family history of DM. Work on low [employer].. Info given. Increase fiber and protein in diet. Get back to regular exercise as able.  Will eval an A1C and consider nutrition referral if necessary.

## 2016-08-17 ENCOUNTER — Ambulatory Visit: Payer: Medicare Other | Attending: Physician Assistant | Admitting: Occupational Therapy

## 2016-08-17 DIAGNOSIS — I890 Lymphedema, not elsewhere classified: Secondary | ICD-10-CM | POA: Insufficient documentation

## 2016-08-18 NOTE — Patient Instructions (Signed)

## 2016-08-19 DIAGNOSIS — M545 Low back pain: Secondary | ICD-10-CM | POA: Diagnosis not present

## 2016-08-19 DIAGNOSIS — S76112A Strain of left quadriceps muscle, fascia and tendon, initial encounter: Secondary | ICD-10-CM | POA: Diagnosis not present

## 2016-08-19 DIAGNOSIS — S39012A Strain of muscle, fascia and tendon of lower back, initial encounter: Secondary | ICD-10-CM | POA: Diagnosis not present

## 2016-08-19 NOTE — Therapy (Signed)
[place] MAIN [facility] SERVICES [address] [place], [place], [address] Phone: [phone]   Fax:  [phone]  Occupational Therapy Evaluation  Patient Details  Name: Shawn Lam MRN: [id number] Date of Birth: 1951/03/29 Referring Provider: Algie Coffer, MD  Encounter Date: 08/17/2016      OT End of Session - 08/18/16 0854    Visit Number 1   Number of Visits 12   Date for OT Re-Evaluation 11/15/16   OT Start Time 1105   OT Stop Time 9379   OT Time Calculation (min) 60 min   Activity Tolerance Patient tolerated treatment well;No increased pain   Behavior During Therapy WFL for tasks assessed/performed      Past Medical History:  Diagnosis Date  . Anemia   . Anxiety   . Asymptomatic LV dysfunction    mild with EF 42% by recent nuclear stress test.  . Berger's disease   . CAD (coronary artery disease) 06/2005   PCI of LAD and RCA  . Depression   . Hematuria   . Hyperlipidemia   . Insomnia   . Left ureteral stone   . Orthostatic hypotension    negative tilt table  . OSA (obstructive sleep apnea)    moderate with AHI 17/hr  . PVC's (premature ventricular contractions) 07/07/2016  . Renal disorder    PT IS SEEING DR. Lorrene Reid- NEPHROLOGIST  . Rosacea   . Urticaria     Past Surgical History:  Procedure Laterality Date  . CARDIAC CATHETERIZATION  09/06/2013   [place]  . CARDIAC CATHETERIZATION    . CORONARY ANGIOPLASTY  2004   STENT PLACEMENT  . CYSTOSCOPY WITH RETROGRADE PYELOGRAM, URETEROSCOPY AND STENT PLACEMENT Left 03/19/2014   Procedure: CYSTOSCOPY WITH RETROGRADE PYELOGRAM, URETEROSCOPY AND STENT PLACEMENT;  Surgeon: Junious Dresser, MD;  Location: [facility];  Service: Urology;  Laterality: Left;  . CYSTOSCOPY WITH RETROGRADE PYELOGRAM, URETEROSCOPY AND STENT PLACEMENT Bilateral 05/20/2015   Procedure:  CYSTOSCOPY WITH BILATERAL RETROGRADE PYELOGRAM,RIGHT  DIAGNOSTIC URETEROSCOPY ,LEFT URETEROSCOPY WITH HOLMIUM LASER  AND  BILATERAL  STENT PLACEMENT ;  Surgeon: Alexis Frock, MD;  Location: [facility];  Service: Urology;  Laterality: Bilateral;  . EP IMPLANTABLE DEVICE N/A 05/13/2016   Procedure: Loop Recorder Insertion;  Surgeon: Deboraha Sprang, MD;  Location: [place] CV LAB;  Service: Cardiovascular;  Laterality: N/A;  . HOLMIUM LASER APPLICATION Bilateral 0/05/4095   Procedure: HOLMIUM LASER APPLICATION;  Surgeon: Alexis Frock, MD;  Location: [facility];  Service: Urology;  Laterality: Bilateral;  . LEFT HEART CATHETERIZATION WITH CORONARY ANGIOGRAM N/A 09/06/2013   Procedure: LEFT HEART CATHETERIZATION WITH CORONARY ANGIOGRAM;  Surgeon: Sinclair Grooms, MD;  Location: [facility] CATH LAB;  Service: Cardiovascular;  Laterality: N/A;  . LEFT KNEE CAP  SURGERY ABOUT 40 YRS AGO  ? 1970    . STONE EXTRACTION WITH BASKET Left 03/19/2014   Procedure: STONE EXTRACTION WITH BASKET;  Surgeon: Junious Dresser, MD;  Location: [facility];  Service: Urology;  Laterality: Left;    There were no vitals filed for this visit.      Subjective Assessment - 08/18/16 0839    Subjective  Mr [name] is a 66 yo gentleman referred for Occupational Therapy evaluation and treatment of BLE lymphedema (LE) by Algie Coffer, MD. Pt reports sudden onset of BLE leg swelling without known precipitating event in April, 2018. Pt reports Pt reports renal, and cardiac etiologies have been ruled out.  Pt reports Doppler study was  negative for DVT., but no record of Doppler imaging is found within the record. Pt denies family history of leg swelling, then recalls that his mother's legs swelled. Pt has not undergone priot LE therapy and he does not wear compression garments. He reports that he currently runs ~ 10 miles daily for exercise.   Pertinent History Pre-diabetesBerger's Disease, CAD 06/2005, OSA, Renal disease, neuropathy   Patient Stated Goals decrease swelling and keep it from coming back   Currently in Pain? No/denies           [facility] OT Assessment -  08/19/16 0001      Assessment   Diagnosis Mild, stage 1, RLE lymphedema   Referring Provider Algie Coffer, MD   Onset Date 07/10/16   Assessment insideous onset without precipitating event. Pt reports MDs ruled out kidney and cardiac related swelling/ fluid retention. NoDopler study in record. Pt has had releif from lasix  2 x daily   Prior Therapy no CDT; no compression     Precautions   Precaution Comments lymphedema precautions, cardiac precautions     Balance Screen   Has the patient fallen in the past 6 months No     Prior Function   Level of Independence Independent   Vocation Retired   [employer] 10 miles several days/week     Mobility   Mobility Status Independent   Mobility Status Comments WNL     Observation/Other Assessments   Observations Mild, stage 2, RLE , non pitting LE. Tissue is spongy to palpation without pain. Skin color and temperature are WNL w/out signs/symptoms of infection. Stemmer sign is negative and skin creases are absent. R thigh is spared. LLE appears WNL   Skin Integrity spongy subq     Sensation   Light Touch Appears Intact     Coordination   Gross Motor Movements are Fluid and Coordinated Yes   Fine Motor Movements are Fluid and Coordinated Yes     ROM / Strength   AROM / PROM / Strength --  WNL     AROM   Overall AROM  Within functional limits for tasks performed          LYMPHEDEMA/ONCOLOGY QUESTIONNAIRE - 08/19/16 1510      Right Lower Extremity Lymphedema   Other TBA. By visual assessment limb volume differential (LVD) appears to be ~ 15% below the knee                OT Treatments/Exercises (OP) - 08/19/16 0001      Transfers   Transfers Sit to Stand  WNL     ADLs   Overall ADLs basic and instrumental ADLs WNL except when RLE is swollen. When swollen has dificulty walking, standing, running for exercise, fitting street shoes and LB clothing   ADL Education Given Yes     Manual Therapy    Manual Therapy Edema management               OT Education - 08/18/16 0853    Education provided Yes   Education Details Provided Pt/caregiver skilled education and ADL training throughout visit for lymphedema etiology, progression, and treatment including Intensive and Management Phase Complete Decongestive Therapy (CDT)  Discussed lymphedema precautions, cellulitis risk, and all CDT and LE self-care components, including compression wrapping/ garments & devices, lymphatic pumping ther ex, simple self-MLD, and skin care. Provided printed Lymphedema Workbook for reference.   Person(s) Educated Patient   Methods Explanation;Demonstration;Handout   Comprehension Verbalized understanding;Need further instruction  OT Long Term Goals - 08/18/16 0856      OT LONG TERM GOAL #1   Title Pt independent w/ lymphedema precautions and prevention principals and strategies to limit LE progression and infection risk.   Baseline Max A   Time 1   Period Weeks   Status New     OT LONG TERM GOAL #2   Title Lymphedema (LE) management/ self-care: Pt able to apply multi layered, gradient compression wraps w independently using proper techniques within 2 weeks to achieve optimal limb volume reductions bilaterally.   Baseline Max A   Time 2   Period Weeks   Status New     OT LONG TERM GOAL #3   Title Lymphedema (LE) management/ self-care:  Pt to achieve at least 5% limb volume reduction in RLE during modified Intensive CDT to limit LE progression, to reduce pain, and to improve safe ambulation and functional mobility.   Baseline Max A   Time 6   Period Weeks   Status New     OT LONG TERM GOAL #4   Title Lymphedema (LE) management/ self-care:  Pt to tolerate daily compression wraps, compression garments and/ or HOS devices in keeping w/ prescribed wear regime within 1 week of issue date to progress and retain clinical and functional gains and to limit LE progression.   Baseline Max  A   Time 6   Period Weeks   Status New     OT LONG TERM GOAL #5   Title Lymphedema (LE) management/ self-care:  During Management Phase CDT Pt to sustain limb volume reduction achieved during INtensive CDT within 5%  to limit LE progression, infection risk and further functional decline.   Baseline Max A   Time 6   Period Weeks   Status New               Plan - 08/19/16 1511    Rehab Potential Good   Clinical Impairments Affecting Rehab Potential Pt presents with mild, stage 1, RLE (dominant side) lymphedema with insideous onset approxcimately 1 month ago. Pt is unaware of a precipitating event. Pt reports recent swelling has reduced to nearlt normal with Lasix diuretic 2 x daily, despite Pt reports that MD's ruled out systemic causes. Pt has minimal family hx off leg swelling.. Tissue is palpably spongy like protien ritch lymphedema, and swelling is fluctuating, in keeping w/ class 1 classification. While etiology is unknown, leg edema may be dramatically exacerbated with 10 mile runds several days weekly. Pt agrees with plan to undergo an abbreviated course of intensive phase  Complete Decongestive Therapy, (CDT) to decrease swelling, learn LE self care/ management, improve tissue integrity and fit with appropriate compression garments for both normal duty and high intensity exercise activities of choice. Without skilled Occupational Therapy for lymphedema care, this chronic , progressive condition is likely to worsen and further functional decline is ex pected.   OT Frequency 2x / week   OT Duration Other (comment)  and PRN for CDT, including MLD, skin care, compression wrapping and ther ex. Fit w/ appropriate compression garments bilaterally.   OT Treatment/Interventions Self-care/ADL training;DME and/or AE instruction;Patient/family education;Compression bandaging;Therapeutic exercise;Therapeutic activities;Manual Therapy   Plan Fit w/ appropriate  ccl 2 compression knee length  garments for daily use. Consider increased compression class for running activity as this activity increases the lymphatic load of water , possibly above Pt's current safety net tolerance   OT Home Exercise Plan lymphatic pumping therex-BLE- 2 x daily, 2 sets  of 10 in sequence      Patient will benefit from skilled therapeutic intervention in order to improve the following deficits and impairments:  Decreased knowledge of use of DME, Decreased skin integrity, Increased edema  Visit Diagnosis: Lymphedema, not elsewhere classified - Plan: Ot plan of care cert/re-cert    Problem List Patient Active Problem List   Diagnosis Date Noted  . Lymphedema 07/19/2016  . PVC's (premature ventricular contractions) 07/07/2016  . Hypoalbuminemia 06/28/2016  . IgA nephropathy 06/07/2016  . Bilateral leg edema 05/23/2016  . Syncope and collapse 05/13/2016  . Palpitation 03/15/2016  . Mild depression ([place]) 11/20/2015  . Snoring 02/05/2014  . OSA (obstructive sleep apnea) 02/05/2014  . Orthostatic hypotension 02/07/2013  . Macrocytic anemia 01/31/2013  . Scotoma involving central area 01/29/2013  . Optic neuropathy 01/28/2013  . HEMATURIA UNSPECIFIED 01/21/2010  . Elevated glucose 06/24/2008  . Hyperlipidemia 07/05/2006  . Generalized anxiety disorder 07/05/2006  . Coronary atherosclerosis 07/05/2006  . GERD 07/05/2006    Andrey Spearman, MS, OTR/L, [facility] 08/19/16 3:25 PM  [place] MAIN [facility] SERVICES [address] [place], [place], [address] Phone: [phone]   Fax:  [phone]  Name: ELSON ULBRICH MRN: [id number] Date of Birth: 02-12-1951

## 2016-08-21 NOTE — Progress Notes (Signed)
Cardiology Office Note    Date:  08/23/2016   ID:  Shawn Lam, DOB [birth year], MRN [id number]  PCP:  Jinny Sanders, MD  Cardiologist:  Fransico Him, MD   Chief Complaint  Patient presents with  . Coronary Artery Disease  . Hyperlipidemia  . Sleep Apnea    History of Present Illness:  Shawn Lam is a 66 y.o. male with a history of ASCAD with PCI of the LAD and RCA , dyslipidemia, low normal LVF with EF 50-55% with inferior HK and mild dilated aortic root at 51mm by echo 06/2016.   He also has had siginficant problems with orthostatic hypotension that have recently been stabel on proamatine. He had also been on Florinef but this was stopped due to LE edema but edema did not improve.  The plan was if his edema did not improve after holding florinef then would refer to lymphedema clinic.  LE venous dopplers were negative for DVT and he has already been evaluated in Lymphedema clinic in [place] and has his first treatment later today.  He also has moderate OSA with AHI of 17/hr and is compliant on CPAP at 10cm H2O.   He is here today for followup.  When I last saw him he has started to have severe LE swelling that occurred right after placement of LINQ recorder. There was no evidence of lymphatic obstruction and 2D echo was done which showed improvement in EF.  He was worked up for adrenal/renal causes of edema which all were normal.  Albumin was mildly reduced at 3.3.   Today he denies any anginal chest pain or pressure, DOE, PND, orthopnea, claudication or syncope. His dizziness is stable on proamatine with only occasional problems when standing up too fast.He is doing well with his CPAP device.  He tolerates his full face mask with minimal leakage and feels the pressure is adequate.  He feels more rested in the am and does not nap during the day. His wife says that his snoring has improved.  He has some dry mouth but uses the humidity.  He still complains of LE edema and is on  diuretics.  It is felt that he has lymphedema and is going to the lymphedema clinic for his first treatment today. He still has chronic fatigue and is very frustrated.    Past Medical History:  Diagnosis Date  . Anemia   . Anxiety   . Asymptomatic LV dysfunction    mild with EF 42% by recent nuclear stress test.  . Berger's disease   . CAD (coronary artery disease) 06/2005   PCI of LAD and RCA  . Depression   . Hematuria   . Hyperlipidemia   . Insomnia   . Left ureteral stone   . Orthostatic hypotension    negative tilt table  . OSA (obstructive sleep apnea)    moderate with AHI 17/hr  . PVC's (premature ventricular contractions) 07/07/2016  . Renal disorder    PT IS SEEING DR. Lorrene Reid- NEPHROLOGIST  . Rosacea   . Urticaria     Past Surgical History:  Procedure Laterality Date  . CARDIAC CATHETERIZATION  09/06/2013   [place]  . CARDIAC CATHETERIZATION    . CORONARY ANGIOPLASTY  2004   STENT PLACEMENT  . CYSTOSCOPY WITH RETROGRADE PYELOGRAM, URETEROSCOPY AND STENT PLACEMENT Left 03/19/2014   Procedure: CYSTOSCOPY WITH RETROGRADE PYELOGRAM, URETEROSCOPY AND STENT PLACEMENT;  Surgeon: Junious Dresser, MD;  [facility];  Service: Urology;  Laterality:  Left;  . CYSTOSCOPY WITH RETROGRADE PYELOGRAM, URETEROSCOPY AND STENT PLACEMENT Bilateral 05/20/2015   Procedure:  CYSTOSCOPY WITH BILATERAL RETROGRADE PYELOGRAM,RIGHT  DIAGNOSTIC URETEROSCOPY ,LEFT URETEROSCOPY WITH HOLMIUM LASER  AND BILATERAL  STENT PLACEMENT ;  Surgeon: Alexis Frock, MD;  Location: [facility];  Service: Urology;  Laterality: Bilateral;  . EP IMPLANTABLE DEVICE N/A 05/13/2016   Procedure: Loop Recorder Insertion;  Surgeon: Deboraha Sprang, MD;  Location: [place] CV LAB;  Service: Cardiovascular;  Laterality: N/A;  . HOLMIUM LASER APPLICATION Bilateral 0/06/5595   Procedure: HOLMIUM LASER APPLICATION;  Surgeon: Alexis Frock, MD;  [facility];  Service: Urology;  Laterality: Bilateral;  . LEFT HEART  CATHETERIZATION WITH CORONARY ANGIOGRAM N/A 09/06/2013   Procedure: LEFT HEART CATHETERIZATION WITH CORONARY ANGIOGRAM;  Surgeon: Sinclair Grooms, MD;  Location: [facility] CATH LAB;  Service: Cardiovascular;  Laterality: N/A;  . LEFT KNEE CAP  SURGERY ABOUT 40 YRS AGO  ? 1970    . STONE EXTRACTION WITH BASKET Left 03/19/2014   Procedure: STONE EXTRACTION WITH BASKET;  Surgeon: Junious Dresser, MD;  [facility];  Service: Urology;  Laterality: Left;    Current Medications: Current Meds  Medication Sig  . aspirin EC 81 MG tablet Take 81 mg by mouth at bedtime.   [name] atorvastatin (LIPITOR) 40 MG tablet TAKE 1 TABLET BY MOUTH DAILY. (Patient taking differently: Take 40mg s once daily in the morning)  . clopidogrel (PLAVIX) 75 MG tablet TAKE 1 TABLET (75 MG TOTAL) BY MOUTH DAILY. (Patient taking differently: Take 75 mg by mouth at bedtime. )  . [employer] IRON 325 (65 Fe) MG tablet TAKE 1 TABLET BY MOUTH 3 TIMES DAILY WITH MEALS.  . cyclobenzaprine (FLEXERIL) 10 MG tablet   . fludrocortisone (FLORINEF) 0.1 MG tablet Take 1 tablet (0.1 mg total) by mouth daily.  [name] FLUOCINOLONE ACETONIDE BODY 0.01 % OIL Apply 1 application topically daily as needed (for dry skin).   [name] FLUoxetine (PROZAC) 40 MG capsule Take 1 capsule (40 mg total) by mouth every morning.  . furosemide (LASIX) 20 MG tablet Take 1 tablet (20 mg total) by mouth daily.  Javier Docker Oil 500 MG CAPS Take 500 mg by mouth daily.  [name] L-Methylfolate-Algae (DEPLIN 15) 15-90.314 MG CAPS Take 15 mg by mouth every evening.  . midodrine (PROAMATINE) 5 MG tablet TAKE 1 TABLET (5 MG TOTAL) BY MOUTH 2 (TWO) TIMES DAILY WITH BREAKFAST AND LUNCH.  . mirtazapine (REMERON) 15 MG tablet Take 1 tablet (15 mg total) by mouth at bedtime.  . Multiple Vitamins-Minerals (CENTRUM ADULTS PO) Take 1 tablet by mouth every morning.   [name] NITROSTAT 0.4 MG SL tablet PLACE 1 TABLET (0.4 MG TOTAL) UNDER THE TONGUE EVERY 5 (FIVE) MINUTES AS NEEDED FOR CHEST PAIN.  [name] vitamin B-12  (CYANOCOBALAMIN) 1000 MCG tablet Take 1,000 mcg by mouth at bedtime.     Allergies:   Codeine and Tape   Social History   Social History  . Marital status: Married    Spouse name: N/A  . Number of children: N/A  . Years of education: N/A   Occupational History  . retired Print production planner    Social History Main Topics  . Smoking status: Never Smoker  . Smokeless tobacco: Never Used  . Alcohol use 1.2 - 1.8 oz/week    2 - 3 Cans of beer per week  . Drug use: No  . Sexual activity: Yes   Other Topics Concern  . None   Social  History Narrative   Regular exercise--yes--jog 5 miles 6 days a week     Family History:  The patient's family history includes Cancer in his father; Coronary artery disease in his mother; Diabetes in his brother; Heart attack in his mother; Heart disease in his mother; Hypertension in his mother; Lung cancer in his father.   ROS:   Please see the history of present illness.    ROS All other systems reviewed and are negative.  No flowsheet data found.     PHYSICAL EXAM:   VS:  BP 110/68   Pulse 78   Ht 6' (1.829 m)   Wt 163 lb 12.8 oz (74.3 kg)   SpO2 99%   BMI 22.22 kg/m    GEN: Well nourished, well developed, in no acute distress  HEENT: normal  Neck: no JVD, carotid bruits, or masses Cardiac: RRR; no murmurs, rubs, or gallops,no edema.  Intact distal pulses bilaterally.  Respiratory:  clear to auscultation bilaterally, normal work of breathing GI: soft, nontender, nondistended, + BS MS: no deformity or atrophy  Skin: warm and dry, no rash Neuro:  Alert and Oriented x 3, Strength and sensation are intact Psych: euthymic mood, full affect  Wt Readings from Last 3 Encounters:  08/22/16 163 lb 12.8 oz (74.3 kg)  08/11/16 161 lb 8 oz (73.3 kg)  08/01/16 170 lb (77.1 kg)      Studies/Labs Reviewed:   EKG:  EKG is not ordered today.    Recent Labs: 06/03/2016: Hemoglobin 12.0; Platelets 215.0; Pro B Natriuretic peptide (BNP)  38.0 06/28/2016: TSH 4.90 07/19/2016: ALT 29 08/11/2016: BUN 27; Creatinine, Ser 1.72; Potassium 5.0; Sodium 144   Lipid Panel    Component Value Date/Time   CHOL 145 07/19/2016 0853   TRIG 60 07/19/2016 0853   HDL 70 07/19/2016 0853   CHOLHDL 2.1 07/19/2016 0853   CHOLHDL 2 02/02/2016 0752   VLDL 8.6 02/02/2016 0752   LDLCALC 63 07/19/2016 0853   LDLDIRECT 57 06/22/2009 1228    Additional studies/ records that were reviewed today include:  none    ASSESSMENT:    1. Atherosclerosis of native coronary artery of native heart without angina pectoris   2. Orthostatic hypotension   3. Pure hypercholesterolemia   4. Syncope and collapse   5. Lymphedema   6. OSA (obstructive sleep apnea)      PLAN:  In order of problems listed above:  1. ASCAD with PCI of the LAD and RCA with no ischemia on recent nuclear stress test 02/2016 and inferior HK.  - he has no anginal symptoms. He will continue ASA/statin/Plavix.  2. Orthostatic hypotension - he has not had any recent dizzy spells unless he gets up too fast and is well controlled on proamatine and Florinef.  3. Hyperlipidemia - LDL goal <70. Continue statin. LDL at goal at 71.    4.   Syncope - He has not had any reoccurence in and now is wearing a LINQ recorder and followed by Dr. Caryl Comes.   5.    Lymphedema - he has had problems with LE edema ever since his LINQ was placed.  Workup for adrenal or renal causes was unremarkable and BNP was normal.  2D echo showed low normal LVF.  He was treated by his PCP with lasix with minimal improvement. Abdominal and pelvic CT showed lymphedema.  He still has LE edema which for the most part is non pitting and is consistent with lymphedema.  He was already evaluated in  lymphedema clinic and has his first treatment today.  6.  Chronic fatigue ? Etiology.  I will repeat an am cortisol level and testosterone level.  2D echo with normal LVF and no ischemia on nuclear stress test.    Medication  Adjustments/Labs and Tests Ordered: Current medicines are reviewed at length with the patient today.  Concerns regarding medicines are outlined above.  Medication changes, Labs and Tests ordered today are listed in the Patient Instructions below.  Patient Instructions  Medication Instructions:  Your physician recommends that you continue on your current medications as directed. Please refer to the Current Medication list given to you today.   Labwork: Your physician recommends that you return for lab work.   Testing/Procedures: None  Follow-Up: Your physician wants you to follow-up in: 6 months with Dr. Radford Pax. You will receive a reminder letter in the mail two months in advance. If you don't receive a letter, please call our office to schedule the follow-up appointment.   Any Other Special Instructions Will Be Listed Below (If Applicable).     If you need a refill on your cardiac medications before your next appointment, please call your pharmacy.      Signed, Fransico Him, MD  08/23/2016 8:38 AM    [address], [place], [place]  [address] Phone: [phone]; Fax: [phone]

## 2016-08-22 ENCOUNTER — Encounter: Payer: Self-pay | Admitting: Cardiology

## 2016-08-22 ENCOUNTER — Other Ambulatory Visit: Payer: Self-pay | Admitting: Orthopedic Surgery

## 2016-08-22 ENCOUNTER — Ambulatory Visit: Payer: Medicare Other | Admitting: Occupational Therapy

## 2016-08-22 ENCOUNTER — Ambulatory Visit (INDEPENDENT_AMBULATORY_CARE_PROVIDER_SITE_OTHER): Payer: Medicare Other | Admitting: Cardiology

## 2016-08-22 VITALS — BP 110/68 | HR 78 | Ht 72.0 in | Wt 163.8 lb

## 2016-08-22 DIAGNOSIS — I2510 Atherosclerotic heart disease of native coronary artery without angina pectoris: Secondary | ICD-10-CM | POA: Diagnosis not present

## 2016-08-22 DIAGNOSIS — I890 Lymphedema, not elsewhere classified: Secondary | ICD-10-CM

## 2016-08-22 DIAGNOSIS — R55 Syncope and collapse: Secondary | ICD-10-CM

## 2016-08-22 DIAGNOSIS — I951 Orthostatic hypotension: Secondary | ICD-10-CM

## 2016-08-22 DIAGNOSIS — E7800 Pure hypercholesterolemia, unspecified: Secondary | ICD-10-CM

## 2016-08-22 DIAGNOSIS — S39012A Strain of muscle, fascia and tendon of lower back, initial encounter: Secondary | ICD-10-CM

## 2016-08-22 NOTE — Therapy (Signed)
[place] MAIN [facility] SERVICES [address] [place], [place], [address] Phone: [phone]   Fax:  [phone]  Occupational Therapy Treatment  Patient Details  Name: Shawn Lam MRN: [id number] Date of Birth: [birth year] Referring Provider: Algie Coffer, MD  Encounter Date: 08/22/2016      OT End of Session - 08/22/16 1629    Visit Number 2   Number of Visits 12   Date for OT Re-Evaluation 11/15/16   OT Start Time 0305   OT Stop Time 0358   OT Time Calculation (min) 53 min   Activity Tolerance Patient tolerated treatment well;No increased pain   Behavior During Therapy WFL for tasks assessed/performed      Past Medical History:  Diagnosis Date  . Anemia   . Anxiety   . Asymptomatic LV dysfunction    mild with EF 42% by recent nuclear stress test.  . Berger's disease   . CAD (coronary artery disease) 06/2005   PCI of LAD and RCA  . Depression   . Hematuria   . Hyperlipidemia   . Insomnia   . Left ureteral stone   . Orthostatic hypotension    negative tilt table  . OSA (obstructive sleep apnea)    moderate with AHI 17/hr  . PVC's (premature ventricular contractions) 07/07/2016  . Renal disorder    PT IS SEEING DR. Lorrene Reid- NEPHROLOGIST  . Rosacea   . Urticaria     Past Surgical History:  Procedure Laterality Date  . CARDIAC CATHETERIZATION  09/06/2013   [place]  . CARDIAC CATHETERIZATION    . CORONARY ANGIOPLASTY  2004   STENT PLACEMENT  . CYSTOSCOPY WITH RETROGRADE PYELOGRAM, URETEROSCOPY AND STENT PLACEMENT Left 03/19/2014   Procedure: CYSTOSCOPY WITH RETROGRADE PYELOGRAM, URETEROSCOPY AND STENT PLACEMENT;  Surgeon: Junious Dresser, MD;  Location: [facility];  Service: Urology;  Laterality: Left;  . CYSTOSCOPY WITH RETROGRADE PYELOGRAM, URETEROSCOPY AND STENT PLACEMENT Bilateral 05/20/2015   Procedure:  CYSTOSCOPY WITH BILATERAL RETROGRADE PYELOGRAM,RIGHT  DIAGNOSTIC URETEROSCOPY ,LEFT URETEROSCOPY WITH HOLMIUM LASER  AND  BILATERAL  STENT PLACEMENT ;  Surgeon: Alexis Frock, MD;  Location: [facility];  Service: Urology;  Laterality: Bilateral;  . EP IMPLANTABLE DEVICE N/A 05/13/2016   Procedure: Loop Recorder Insertion;  Surgeon: Deboraha Sprang, MD;  Location: [place] [facility];  Service: Cardiovascular;  Laterality: N/A;  . HOLMIUM LASER APPLICATION Bilateral 10/09/624   Procedure: HOLMIUM LASER APPLICATION;  Surgeon: Alexis Frock, MD;  [facility];  Service: Urology;  Laterality: Bilateral;  . LEFT HEART CATHETERIZATION WITH CORONARY ANGIOGRAM N/A 09/06/2013   Procedure: LEFT HEART CATHETERIZATION WITH CORONARY ANGIOGRAM;  Surgeon: Sinclair Grooms, MD;  Location: [facility];  Service: Cardiovascular;  Laterality: N/A;  . LEFT KNEE CAP  SURGERY ABOUT 40 YRS AGO  ? 1970    . STONE EXTRACTION WITH BASKET Left 03/19/2014   Procedure: STONE EXTRACTION WITH BASKET;  Surgeon: Junious Dresser, MD;  Location: [facility];  Service: Urology;  Laterality: Left;    There were no vitals filed for this visit.      Subjective Assessment - 08/22/16 1619    Subjective  [name] presents for OT visit 2/12 for CDT to treat BLE lymphedema (LE) by Shawn Squibb MD. Pt has no new  complaints since initial OT evaluation . Pt reports swelling continues to fluctuate in R leg, but continues to be better than when sudden onset was observed in April.   Pertinent History  Pre-diabetesBerger's Disease, CAD 06/2005, OSA, Renal disease, neuropathy   Patient Stated Goals decrease swelling and keep it from coming back   Currently in Pain? No/denies             LYMPHEDEMA/ONCOLOGY QUESTIONNAIRE - 08/22/16 1624      Right Lower Extremity Lymphedema   Other RLE A-D (ankle to tibial tuberosity) = 3612.19 ml.   Other BLE A-D Limb volume diffierential (LVD)= 2.35%, L>R     Left Lower Extremity Lymphedema   Other LLE A-D (ankle to tibial tuberosity) = 3697.22 ml ml.                 OT Treatments/Exercises (OP) - 08/22/16  0001      ADLs   ADL Education Given Yes     Manual Therapy   Manual Therapy Edema management;Compression Bandaging   Edema Management BLE copmparative limb volumetrics   Compression Bandaging Ankle to below Knee (A-D)  RLE gradient compression wraps applied from foot to below knee as follows: LLE toe wrap omitted. . Knee length cotton stockinett from base of toes to knee. One roll Rasidol soft foam. cumferentially from foot to tibila tuberosity w/ ~ 50% overlap; single 8 cm x 5 m  to foot and ankle, one 12 cm  x 5 m ankle to achilles origin...all applied circumferentially in custommary layered gradient configuration. Fitted w/ 3 x non-slip sock as Pt unable to fit R shoe over wraps. Pt will wear lace up tennis shoes to next session to limit fall risk.                OT Education - 08/22/16 1627    Education provided Yes   Education Details Emphasis of Pt LE self care edu today on explanation of purpose and outcome of limb volumetrics and differential measured today. Pt educated re intro level short stretch gradient compression rational, effect on physiology and intro level techniques.             OT Long Term Goals - 08/18/16 0856      OT LONG TERM GOAL #1   Title Pt independent w/ lymphedema precautions and prevention principals and strategies to limit LE progression and infection risk.   Baseline Max A   Time 1   Period Weeks   Status New     OT LONG TERM GOAL #2   Title Lymphedema (LE) management/ self-care: Pt able to apply multi layered, gradient compression wraps w independently using proper techniques within 2 weeks to achieve optimal limb volume reductions bilaterally.   Baseline Max A   Time 2   Period Weeks   Status New     OT LONG TERM GOAL #3   Title Lymphedema (LE) management/ self-care:  Pt to achieve at least 5% limb volume reduction in RLE during modified Intensive CDT to limit LE progression, to reduce pain, and to improve safe ambulation and  functional mobility.   Baseline Max A   Time 6   Period Weeks   Status New     OT LONG TERM GOAL #4   Title Lymphedema (LE) management/ self-care:  Pt to tolerate daily compression wraps, compression garments and/ or HOS devices in keeping w/ prescribed wear regime within 1 week of issue date to progress and retain clinical and functional gains and to limit LE progression.   Baseline Max A   Time 6   Period Weeks   Status New     OT LONG TERM GOAL #5  Title Lymphedema (LE) management/ self-care:  During Management Phase CDT Pt to sustain limb volume reduction achieved during INtensive CDT within 5%  to limit LE progression, infection risk and further functional decline.   Baseline Max A   Time 6   Period Weeks   Status New               Plan - 08/22/16 1627    Clinical Impression Statement BLE ankel to knee (A-D landmarks) reveal a limb volume differential (LVD) measuring 2.35%, L>R, which is WNL, except dominant RLE is typically larger than nondominant at baseline.  The difference in this case may certainly be occupational . Despite WNL LVD, RLE remains very mildly swollen by visual inspection below the knee and somewhat spongy to palpation. Pt tolerated  multi layer, gradient compression wrapping w/ short stretch compression wraps below the knee. Cont as per POC.   Rehab Potential Good   Clinical Impairments Affecting Rehab Potential Pt presents with mild, stage 1, RLE (dominant side) lymphedema with insideous onset approxcimately 1 month ago. Pt is unaware of a precipitating event. Pt reports recent swelling has reduced to nearlt normal with Lasix diuretic 2 x daily, despite Pt reports that MD's ruled out systemic causes. Pt has minimal family hx off leg swelling.. Tissue is palpably spongy like protien ritch lymphedema, and swelling is fluctuating, in keeping w/ class 1 classification. While etiology is unknown, leg edema may be dramatically exacerbated with 10 mile runds several  days weekly. Pt agrees with plan to undergo an abbreviated course of intensive phase  Complete Decongestive Therapy, (CDT) to decrease swelling, learn LE self care/ management, improve tissue integrity and fit with appropriate compression garments for both normal duty and high intensity exercise activities of choice. Without skilled Occupational Therapy for lymphedema care, this chronic , progressive condition is likely to worsen and further functional decline is ex pected.   OT Frequency 2x / week   OT Duration Other (comment)  and PRN for CDT, including MLD, skin care, compression wrapping and ther ex. Fit w/ appropriate compression garments bilaterally.   OT Treatment/Interventions Self-care/ADL training;DME and/or AE instruction;Patient/family education;Compression bandaging;Therapeutic exercise;Therapeutic activities;Manual Therapy   OT Home Exercise Plan lymphatic pumping therex-BLE- 2 x daily, 2 sets of 10 in sequence      Patient will benefit from skilled therapeutic intervention in order to improve the following deficits and impairments:  Decreased knowledge of use of DME, Decreased skin integrity, Increased edema  Visit Diagnosis: Lymphedema, not elsewhere classified    Problem List Patient Active Problem List   Diagnosis Date Noted  . Lymphedema 07/19/2016  . PVC's (premature ventricular contractions) 07/07/2016  . Hypoalbuminemia 06/28/2016  . IgA nephropathy 06/07/2016  . Bilateral leg edema 05/23/2016  . Syncope and collapse 05/13/2016  . Palpitation 03/15/2016  . Mild depression ([place]) 11/20/2015  . Snoring 02/05/2014  . OSA (obstructive sleep apnea) 02/05/2014  . Orthostatic hypotension 02/07/2013  . Macrocytic anemia 01/31/2013  . Scotoma involving central area 01/29/2013  . Optic neuropathy 01/28/2013  . HEMATURIA UNSPECIFIED 01/21/2010  . Elevated glucose 06/24/2008  . Hyperlipidemia 07/05/2006  . Generalized anxiety disorder 07/05/2006  . Coronary  atherosclerosis 07/05/2006  . GERD 07/05/2006    Andrey Spearman, MS, OTR/L, [facility] 08/22/16 4:36 PM  [place] MAIN [facility] SERVICES [address] [place], [place], [address] Phone: [phone]   Fax:  [phone]  Name: EIDEN BAGOT MRN: [id number] Date of Birth: [birth year]

## 2016-08-22 NOTE — Patient Instructions (Signed)

## 2016-08-22 NOTE — Patient Instructions (Signed)
Medication Instructions:  Your physician recommends that you continue on your current medications as directed. Please refer to the Current Medication list given to you today.   Labwork: Your physician recommends that you return for lab work.   Testing/Procedures: None  Follow-Up: Your physician wants you to follow-up in: 6 months with Dr. Radford Pax. You will receive a reminder letter in the mail two months in advance. If you don't receive a letter, please call our office to schedule the follow-up appointment.   Any Other Special Instructions Will Be Listed Below (If Applicable).     If you need a refill on your cardiac medications before your next appointment, please call your pharmacy.

## 2016-08-23 ENCOUNTER — Other Ambulatory Visit: Payer: Medicare Other | Admitting: *Deleted

## 2016-08-23 DIAGNOSIS — R55 Syncope and collapse: Secondary | ICD-10-CM | POA: Diagnosis not present

## 2016-08-23 DIAGNOSIS — I951 Orthostatic hypotension: Secondary | ICD-10-CM

## 2016-08-23 LAB — TESTOSTERONE: Testosterone: 767 ng/dL (ref 264–916)

## 2016-08-23 LAB — CORTISOL: CORTISOL: 15.2 ug/dL

## 2016-08-24 NOTE — Therapy (Signed)
[place] MAIN [facility] SERVICES [place], [place], [address] Phone: [phone]   Fax:  [phone]  Occupational Therapy Treatment  Patient Details  Name: Shawn Lam MRN: [id number] Date of Birth: Sep 01, 1950 Referring Provider: Algie Coffer, MD  Encounter Date: 08/17/2016    Past Medical History:  Diagnosis Date  . Anemia   . Anxiety   . Asymptomatic LV dysfunction    mild with EF 42% by recent nuclear stress test.  . Berger's disease   . CAD (coronary artery disease) 06/2005   PCI of LAD and RCA  . Depression   . Hematuria   . Hyperlipidemia   . Insomnia   . Left ureteral stone   . Orthostatic hypotension    negative tilt table  . OSA (obstructive sleep apnea)    moderate with AHI 17/hr  . PVC's (premature ventricular contractions) 07/07/2016  . Renal disorder    PT IS SEEING DR. Lorrene Reid- NEPHROLOGIST  . Rosacea   . Urticaria     Past Surgical History:  Procedure Laterality Date  . CARDIAC CATHETERIZATION  09/06/2013   [place]  . CARDIAC CATHETERIZATION    . CORONARY ANGIOPLASTY  2004   STENT PLACEMENT  . CYSTOSCOPY WITH RETROGRADE PYELOGRAM, URETEROSCOPY AND STENT PLACEMENT Left 03/19/2014   Procedure: CYSTOSCOPY WITH RETROGRADE PYELOGRAM, URETEROSCOPY AND STENT PLACEMENT;  Surgeon: Junious Dresser, MD;  [facility];  Service: Urology;  Laterality: Left;  . CYSTOSCOPY WITH RETROGRADE PYELOGRAM, URETEROSCOPY AND STENT PLACEMENT Bilateral 05/20/2015   Procedure:  CYSTOSCOPY WITH BILATERAL RETROGRADE PYELOGRAM,RIGHT  DIAGNOSTIC URETEROSCOPY ,LEFT URETEROSCOPY WITH HOLMIUM LASER  AND BILATERAL  STENT PLACEMENT ;  Surgeon: Alexis Frock, MD;  [facility];  Service: Urology;  Laterality: Bilateral;  . EP IMPLANTABLE DEVICE N/A 05/13/2016   Procedure: Loop Recorder Insertion;  Surgeon: Deboraha Sprang, MD;  Location: [place] [facility];  Service: Cardiovascular;  Laterality: N/A;  . HOLMIUM LASER APPLICATION  Bilateral 0/08/3974   Procedure: HOLMIUM LASER APPLICATION;  Surgeon: Alexis Frock, MD;  [facility];  Service: Urology;  Laterality: Bilateral;  . LEFT HEART CATHETERIZATION WITH CORONARY ANGIOGRAM N/A 09/06/2013   Procedure: LEFT HEART CATHETERIZATION WITH CORONARY ANGIOGRAM;  Surgeon: Sinclair Grooms, MD;  Location: [facility] CATH LAB;  Service: Cardiovascular;  Laterality: N/A;  . LEFT KNEE CAP  SURGERY ABOUT 40 YRS AGO  ? 1970    . STONE EXTRACTION WITH BASKET Left 03/19/2014   Procedure: STONE EXTRACTION WITH BASKET;  Surgeon: Junious Dresser, MD;  [facility];  Service: Urology;  Laterality: Left;    There were no vitals filed for this visit.                                 OT Long Term Goals - 08/18/16 0856      OT LONG TERM GOAL #1   Title Pt independent w/ lymphedema precautions and prevention principals and strategies to limit LE progression and infection risk.   Baseline Max A   Time 1   Period Weeks   Status New     OT LONG TERM GOAL #2   Title Lymphedema (LE) management/ self-care: Pt able to apply multi layered, gradient compression wraps w independently using proper techniques within 2 weeks to achieve optimal limb volume reductions bilaterally.   Baseline Max A   Time 2   Period Weeks   Status  New     OT LONG TERM GOAL #3   Title Lymphedema (LE) management/ self-care:  Pt to achieve at least 5% limb volume reduction in RLE during modified Intensive CDT to limit LE progression, to reduce pain, and to improve safe ambulation and functional mobility.   Baseline Max A   Time 6   Period Weeks   Status New     OT LONG TERM GOAL #4   Title Lymphedema (LE) management/ self-care:  Pt to tolerate daily compression wraps, compression garments and/ or HOS devices in keeping w/ prescribed wear regime within 1 week of issue date to progress and retain clinical and functional gains and to limit LE progression.   Baseline Max A   Time 6    Period Weeks   Status New     OT LONG TERM GOAL #5   Title Lymphedema (LE) management/ self-care:  During Management Phase CDT Pt to sustain limb volume reduction achieved during INtensive CDT within 5%  to limit LE progression, infection risk and further functional decline.   Baseline Max A   Time 6   Period Weeks   Status New             Patient will benefit from skilled therapeutic intervention in order to improve the following deficits and impairments:  Decreased knowledge of use of DME, Decreased skin integrity, Increased edema  Visit Diagnosis: Lymphedema, not elsewhere classified - Plan: Ot plan of care cert/re-cert      G-Codes - 13/24/40 1717    Functional Assessment Tool Used (Outpatient only) interview, observation, clinical chart review, physical examination   Functional Limitation Self care   Self Care Current Status (N0272) At least 40 percent but less than 60 percent impaired, limited or restricted   Self Care Goal Status (Z3664) At least 1 percent but less than 20 percent impaired, limited or restricted      Problem List Patient Active Problem List   Diagnosis Date Noted  . Lymphedema 07/19/2016  . PVC's (premature ventricular contractions) 07/07/2016  . Hypoalbuminemia 06/28/2016  . IgA nephropathy 06/07/2016  . Bilateral leg edema 05/23/2016  . Syncope and collapse 05/13/2016  . Palpitation 03/15/2016  . Mild depression ([place]) 11/20/2015  . Snoring 02/05/2014  . OSA (obstructive sleep apnea) 02/05/2014  . Orthostatic hypotension 02/07/2013  . Macrocytic anemia 01/31/2013  . Scotoma involving central area 01/29/2013  . Optic neuropathy 01/28/2013  . HEMATURIA UNSPECIFIED 01/21/2010  . Elevated glucose 06/24/2008  . Hyperlipidemia 07/05/2006  . Generalized anxiety disorder 07/05/2006  . Coronary atherosclerosis 07/05/2006  . GERD 07/05/2006    Ansel Bong 08/24/2016, 5:21 PM  [place] MAIN [facility]  SERVICES [address] [place], [place], [address] Phone: [phone]   Fax:  [phone]  Name: Shawn Lam MRN: [id number] Date of Birth: [birth year]

## 2016-08-25 LAB — CUP PACEART REMOTE DEVICE CHECK
Date Time Interrogation Session: 20180503153610
MDC IDC PG IMPLANT DT: 20180202

## 2016-08-30 ENCOUNTER — Ambulatory Visit: Payer: Medicare Other | Admitting: Occupational Therapy

## 2016-08-30 DIAGNOSIS — I890 Lymphedema, not elsewhere classified: Secondary | ICD-10-CM

## 2016-08-30 NOTE — Therapy (Signed)
[place] MAIN [facility] SERVICES [address] [place], [place], [address] Phone: [phone]   Fax:  [phone]  Occupational Therapy Treatment  Patient Details  Name: Shawn Lam MRN: [id number] Date of Birth: 08-May-1950 Referring Provider: Algie Coffer, MD  Encounter Date: 08/30/2016      OT End of Session - 08/30/16 1703    Visit Number 3   Number of Visits 12   Date for OT Re-Evaluation 11/15/16   OT Start Time 0310   OT Stop Time 0400   OT Time Calculation (min) 50 min   Activity Tolerance Patient tolerated treatment well;No increased pain   Behavior During Therapy WFL for tasks assessed/performed      Past Medical History:  Diagnosis Date  . Anemia   . Anxiety   . Asymptomatic LV dysfunction    mild with EF 42% by recent nuclear stress test.  . Berger's disease   . CAD (coronary artery disease) 06/2005   PCI of LAD and RCA  . Depression   . Hematuria   . Hyperlipidemia   . Insomnia   . Left ureteral stone   . Orthostatic hypotension    negative tilt table  . OSA (obstructive sleep apnea)    moderate with AHI 17/hr  . PVC's (premature ventricular contractions) 07/07/2016  . Renal disorder    PT IS SEEING DR. Lorrene Reid- NEPHROLOGIST  . Rosacea   . Urticaria     Past Surgical History:  Procedure Laterality Date  . CARDIAC CATHETERIZATION  09/06/2013   [place]  . CARDIAC CATHETERIZATION    . CORONARY ANGIOPLASTY  2004   STENT PLACEMENT  . CYSTOSCOPY WITH RETROGRADE PYELOGRAM, URETEROSCOPY AND STENT PLACEMENT Left 03/19/2014   Procedure: CYSTOSCOPY WITH RETROGRADE PYELOGRAM, URETEROSCOPY AND STENT PLACEMENT;  Surgeon: Junious Dresser, MD;  Location: [facility];  Service: Urology;  Laterality: Left;  . CYSTOSCOPY WITH RETROGRADE PYELOGRAM, URETEROSCOPY AND STENT PLACEMENT Bilateral 05/20/2015   Procedure:  CYSTOSCOPY WITH BILATERAL RETROGRADE PYELOGRAM,RIGHT  DIAGNOSTIC URETEROSCOPY ,LEFT URETEROSCOPY WITH HOLMIUM LASER  AND  BILATERAL  STENT PLACEMENT ;  Surgeon: Alexis Frock, MD;  Location: [facility];  Service: Urology;  Laterality: Bilateral;  . EP IMPLANTABLE DEVICE N/A 05/13/2016   Procedure: Loop Recorder Insertion;  Surgeon: Deboraha Sprang, MD;  Location: [place] [facility];  Service: Cardiovascular;  Laterality: N/A;  . HOLMIUM LASER APPLICATION Bilateral 07/16/6544   Procedure: HOLMIUM LASER APPLICATION;  Surgeon: Alexis Frock, MD;  Location: [facility];  Service: Urology;  Laterality: Bilateral;  . LEFT HEART CATHETERIZATION WITH CORONARY ANGIOGRAM N/A 09/06/2013   Procedure: LEFT HEART CATHETERIZATION WITH CORONARY ANGIOGRAM;  Surgeon: Sinclair Grooms, MD;  [facility];  Service: Cardiovascular;  Laterality: N/A;  . LEFT KNEE CAP  SURGERY ABOUT 40 YRS AGO  ? 1970    . STONE EXTRACTION WITH BASKET Left 03/19/2014   Procedure: STONE EXTRACTION WITH BASKET;  Surgeon: Junious Dresser, MD;  Location: [facility];  Service: Urology;  Laterality: Left;    There were no vitals filed for this visit.      Subjective Assessment - 08/30/16 1700    Subjective  [name] presents for OT visit 3/12 for CDT to treat BLE lymphedema (LE) by Daleen Squibb MD. Pt has no new  complaints since initial OT evaluation . Pt reports he has had no LE swelling since lasty visit ~ 1 week ago. Pt reports he has tried re-applying compression wraps, but wasn't successful.  Pertinent History Pre-diabetesBerger's Disease, CAD 06/2005, OSA, Renal disease, neuropathy   Patient Stated Goals decrease swelling and keep it from coming back   Currently in Pain? No/denies                      OT Treatments/Exercises (OP) - 08/30/16 0001      ADLs   ADL Education Given Yes     Manual Therapy   Manual Therapy Edema management;Compression Bandaging   Manual therapy comments completed anatomical measurwements for ccl 2 knee length compression stockings,.   Compression Bandaging Ankle to below Knee (A-D)  RLE gradient  compression wraps applied from foot to below knee as follows: LLE toe wrap omitted. . Knee length cotton stockinett from base of toes to knee. One roll Rasidol soft foam. cumferentially from foot to tibila tuberosity w/ ~ 50% overlap; single 8 cm x 5 m  to foot and ankle, one 12 cm  x 5 m ankle to achilles origin...all applied circumferentially in custommary layered gradient configuration. Fitted w/ 3 x non-slip sock as Pt unable to fit R shoe over wraps. Pt will wear lace up tennis shoes to next session to limit fall risk.                OT Education - 08/30/16 1702    Education provided Yes   Education Details Cont edu for applying compression wraps using gradient technique. Educated Pt re compression stockins and options for daytime use.    Person(s) Educated Patient   Methods Demonstration;Explanation   Comprehension Verbalized understanding             OT Long Term Goals - 08/18/16 0856      OT LONG TERM GOAL #1   Title Pt independent w/ lymphedema precautions and prevention principals and strategies to limit LE progression and infection risk.   Baseline Max A   Time 1   Period Weeks   Status New     OT LONG TERM GOAL #2   Title Lymphedema (LE) management/ self-care: Pt able to apply multi layered, gradient compression wraps w independently using proper techniques within 2 weeks to achieve optimal limb volume reductions bilaterally.   Baseline Max A   Time 2   Period Weeks   Status New     OT LONG TERM GOAL #3   Title Lymphedema (LE) management/ self-care:  Pt to achieve at least 5% limb volume reduction in RLE during modified Intensive CDT to limit LE progression, to reduce pain, and to improve safe ambulation and functional mobility.   Baseline Max A   Time 6   Period Weeks   Status New     OT LONG TERM GOAL #4   Title Lymphedema (LE) management/ self-care:  Pt to tolerate daily compression wraps, compression garments and/ or HOS devices in keeping w/  prescribed wear regime within 1 week of issue date to progress and retain clinical and functional gains and to limit LE progression.   Baseline Max A   Time 6   Period Weeks   Status New     OT LONG TERM GOAL #5   Title Lymphedema (LE) management/ self-care:  During Management Phase CDT Pt to sustain limb volume reduction achieved during INtensive CDT within 5%  to limit LE progression, infection risk and further functional decline.   Baseline Max A   Time 6   Period Weeks   Status New  Plan - 08/30/16 1703    Clinical Impression Statement Pt denies leg swelling since last visit ~ 1 week ago. Limb volumes taken last week were WNL. After skilled instruction Pt able to apply compression wraps w/ max assistance from therapist, including verbal and physical cues, as well as handout for reference.. We completed anatomical measurements for Juzo, Dynamic (3512") knee  length (A-D), ccl 2 stockings, w open toe and no silicone band.- SIZE 3 REGULAR LENGTH. Pt just does not have enough swelling present in legs today to warrant compression wraps. Pt will research local DME vendors who may be able to order this garment brand and file hs insurance. If this is not possible OT will assist w/ online vendor options. Cont as per POC. Teach intro level self MLD next visit.     Rehab Potential Good   Clinical Impairments Affecting Rehab Potential Pt presents with mild, stage 1, RLE (dominant side) lymphedema with insideous onset approxcimately 1 month ago. Pt is unaware of a precipitating event. Pt reports recent swelling has reduced to nearlt normal with Lasix diuretic 2 x daily, despite Pt reports that MD's ruled out systemic causes. Pt has minimal family hx off leg swelling.. Tissue is palpably spongy like protien ritch lymphedema, and swelling is fluctuating, in keeping w/ class 1 classification. While etiology is unknown, leg edema may be dramatically exacerbated with 10 mile runds several  days weekly. Pt agrees with plan to undergo an abbreviated course of intensive phase  Complete Decongestive Therapy, (CDT) to decrease swelling, learn LE self care/ management, improve tissue integrity and fit with appropriate compression garments for both normal duty and high intensity exercise activities of choice. Without skilled Occupational Therapy for lymphedema care, this chronic , progressive condition is likely to worsen and further functional decline is ex pected.   OT Frequency 2x / week   OT Duration Other (comment)  and PRN for CDT, including MLD, skin care, compression wrapping and ther ex. Fit w/ appropriate compression garments bilaterally.   OT Treatment/Interventions Self-care/ADL training;DME and/or AE instruction;Patient/family education;Compression bandaging;Therapeutic exercise;Therapeutic activities;Manual Therapy   OT Home Exercise Plan lymphatic pumping therex-BLE- 2 x daily, 2 sets of 10 in sequence      Patient will benefit from skilled therapeutic intervention in order to improve the following deficits and impairments:  Decreased knowledge of use of DME, Decreased skin integrity, Increased edema  Visit Diagnosis: Lymphedema, not elsewhere classified    Problem List Patient Active Problem List   Diagnosis Date Noted  . Lymphedema 07/19/2016  . PVC's (premature ventricular contractions) 07/07/2016  . Hypoalbuminemia 06/28/2016  . IgA nephropathy 06/07/2016  . Bilateral leg edema 05/23/2016  . Syncope and collapse 05/13/2016  . Palpitation 03/15/2016  . Mild depression ([place]) 11/20/2015  . Snoring 02/05/2014  . OSA (obstructive sleep apnea) 02/05/2014  . Orthostatic hypotension 02/07/2013  . Macrocytic anemia 01/31/2013  . Scotoma involving central area 01/29/2013  . Optic neuropathy 01/28/2013  . HEMATURIA UNSPECIFIED 01/21/2010  . Elevated glucose 06/24/2008  . Hyperlipidemia 07/05/2006  . Generalized anxiety disorder 07/05/2006  . Coronary  atherosclerosis 07/05/2006  . GERD 07/05/2006    Andrey Spearman, MS, OTR/L, [facility] 08/30/16 5:12 PM  [place] MAIN [facility] SERVICES [address] [place], [place], [address] Phone: [phone]   Fax:  [phone]  Name: Shawn Lam MRN: [id number] Date of Birth: 1950/11/01

## 2016-08-31 ENCOUNTER — Ambulatory Visit (INDEPENDENT_AMBULATORY_CARE_PROVIDER_SITE_OTHER): Payer: Medicare Other | Admitting: Psychiatry

## 2016-08-31 ENCOUNTER — Encounter: Payer: Self-pay | Admitting: Psychiatry

## 2016-08-31 VITALS — BP 116/70 | HR 81 | Temp 98.0°F | Wt 161.6 lb

## 2016-08-31 DIAGNOSIS — F411 Generalized anxiety disorder: Secondary | ICD-10-CM | POA: Diagnosis not present

## 2016-08-31 DIAGNOSIS — F39 Unspecified mood [affective] disorder: Secondary | ICD-10-CM | POA: Diagnosis not present

## 2016-08-31 DIAGNOSIS — I2510 Atherosclerotic heart disease of native coronary artery without angina pectoris: Secondary | ICD-10-CM

## 2016-08-31 MED ORDER — FLUOXETINE HCL 40 MG PO CAPS: 40.0000 mg | ORAL_CAPSULE | Freq: Every morning | ORAL | 1 refills | Status: DC

## 2016-08-31 MED ORDER — MIRTAZAPINE 15 MG PO TABS: 15.0000 mg | ORAL_TABLET | Freq: Every day | ORAL | 1 refills | Status: DC

## 2016-08-31 NOTE — Telephone Encounter (Signed)
Per dr. Gretel Acre she did not get this message .  Appears that pt came into office on 07-04-16 and was given a 30 day supply not a 90 day and no 90 day was given.

## 2016-08-31 NOTE — Progress Notes (Signed)
Psychiatric MD Progress Note   Patient Identification: Shawn Lam MRN:  [id number] Date of Evaluation:  08/31/2016 Referral Source: Stanton Kidney- Therapist Chief Complaint:   Chief Complaint    Follow-up; Medication Refill     Visit Diagnosis:    ICD-9-CM ICD-10-CM   1. Episodic mood disorder ([place]) 296.90 F39   2. Generalized anxiety disorder 300.02 F41.1     History of Present Illness:    Patient is a 66 year old married male who presented for follow-up. He reported that heContinues to feel anxious due to recent medical issues. He stated that he has been diagnosed with lymphedema and is getting treatment for the same. He has been going to the clinic on alternate days. They are asking him to start wearing leggings as he has significant swelling in his legs. He reported that he has also been using  CPAP machine after the sleep study was done.  He was given Lasix and his primary care physician is in contact with his cardiologist.He reported that the current medications are helping him. He is compliant with his medications. He wants to stop the Deplin as he feels that the Prozac has been helpful. He currently denied having any suicidal ideations or plans. He reported that his anxiety is improving. He appeared calm and alert during the interview.     Associated Signs/Symptoms: Depression Symptoms:  fatigue, anxiety, decreased appetite, (Hypo) Manic Symptoms:  Labiality of Mood, Anxiety Symptoms:  Excessive Worry, Psychotic Symptoms:  none PTSD Symptoms: Negative NA  Past Psychiatric History:  Pt reported history of anxiety and has been taking medications including Zoloft and Xanax prescribed by his primary care physician. He denied any previous history of psychiatric hospitalization. No history of suicide attempt.  Previous Psychotropic Medications: Xanax 0.5 mg by mouth twice a day Zoloft 100 mg by mouth twice a day   Substance Abuse History in the last 12 months:  Yes.   3  beers/week  Consequences of Substance Abuse: Negative NA  Past Medical History:  Past Medical History:  Diagnosis Date  . Anemia   . Anxiety   . Asymptomatic LV dysfunction    mild with EF 42% by recent nuclear stress test.  . Berger's disease   . CAD (coronary artery disease) 06/2005   PCI of LAD and RCA  . Depression   . Hematuria   . Hyperlipidemia   . Insomnia   . Left ureteral stone   . Orthostatic hypotension    negative tilt table  . OSA (obstructive sleep apnea)    moderate with AHI 17/hr  . PVC's (premature ventricular contractions) 07/07/2016  . Renal disorder    PT IS SEEING DR. Lorrene Reid- NEPHROLOGIST  . Rosacea   . Urticaria     Past Surgical History:  Procedure Laterality Date  . CARDIAC CATHETERIZATION  09/06/2013   [place]  . CARDIAC CATHETERIZATION    . CORONARY ANGIOPLASTY  2004   STENT PLACEMENT  . CYSTOSCOPY WITH RETROGRADE PYELOGRAM, URETEROSCOPY AND STENT PLACEMENT Left 03/19/2014   Procedure: CYSTOSCOPY WITH RETROGRADE PYELOGRAM, URETEROSCOPY AND STENT PLACEMENT;  Surgeon: Junious Dresser, MD;  [facility];  Service: Urology;  Laterality: Left;  . CYSTOSCOPY WITH RETROGRADE PYELOGRAM, URETEROSCOPY AND STENT PLACEMENT Bilateral 05/20/2015   Procedure:  CYSTOSCOPY WITH BILATERAL RETROGRADE PYELOGRAM,RIGHT  DIAGNOSTIC URETEROSCOPY ,LEFT URETEROSCOPY WITH HOLMIUM LASER  AND BILATERAL  STENT PLACEMENT ;  Surgeon: Alexis Frock, MD;  Location[facility];  Service: Urology;  Laterality: Bilateral;  . EP IMPLANTABLE DEVICE N/A 05/13/2016  Procedure: Loop Recorder Insertion;  Surgeon: Deboraha Sprang, MD;  Location: [place] CV LAB;  Service: Cardiovascular;  Laterality: N/A;  . HOLMIUM LASER APPLICATION Bilateral 09/13/3327   Procedure: HOLMIUM LASER APPLICATION;  Surgeon: Alexis Frock, MD;  [facility];  Service: Urology;  Laterality: Bilateral;  . LEFT HEART CATHETERIZATION WITH CORONARY ANGIOGRAM N/A 09/06/2013   Procedure: LEFT HEART CATHETERIZATION  WITH CORONARY ANGIOGRAM;  Surgeon: Sinclair Grooms, MD;  Location: [facility] CATH LAB;  Service: Cardiovascular;  Laterality: N/A;  . LEFT KNEE CAP  SURGERY ABOUT 40 YRS AGO  ? 1970    . STONE EXTRACTION WITH BASKET Left 03/19/2014   Procedure: STONE EXTRACTION WITH BASKET;  Surgeon: Junious Dresser, MD;  [facility];  Service: Urology;  Laterality: Left;    Family Psychiatric History:  Pt denied  Family History:  Family History  Problem Relation Age of Onset  . Coronary artery disease Mother   . Heart attack Mother   . Heart disease Mother   . Hypertension Mother   . Cancer Father        lung and colon  . Lung cancer Father        smoker  . Diabetes Brother     Social History:   Social History   Social History  . Marital status: Married    Spouse name: N/A  . Number of children: N/A  . Years of education: N/A   Occupational History  . retired Print production planner    Social History Main Topics  . Smoking status: Never Smoker  . Smokeless tobacco: Never Used  . Alcohol use 1.2 - 1.8 oz/week    2 - 3 Cans of beer per week  . Drug use: No  . Sexual activity: Yes   Other Topics Concern  . 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  Worked for [employer].   Allergies:   Allergies  Allergen Reactions  . Codeine Nausea Only  . Tape Other (See Comments)    Use only paper tape. Severe blistering and bruising with other tapes. No cloth tape.    Metabolic Disorder Labs: Lab Results  Component Value Date   HGBA1C 5.1 08/11/2016   No results found for: PROLACTIN Lab Results  Component Value Date   CHOL 145 07/19/2016   TRIG 60 07/19/2016   HDL 70 07/19/2016   CHOLHDL 2.1 07/19/2016   VLDL 8.6 02/02/2016   LDLCALC 63 07/19/2016   LDLCALC 71 02/02/2016     Current  Medications: Current Outpatient Prescriptions  Medication Sig Dispense Refill  . aspirin EC 81 MG tablet Take 81 mg by mouth at bedtime.     [name] atorvastatin (LIPITOR) 40 MG tablet TAKE 1 TABLET BY MOUTH DAILY. (Patient taking differently: Take 40mg s once daily in the morning) 90 tablet 3  . clopidogrel (PLAVIX) 75 MG tablet TAKE 1 TABLET (75 MG TOTAL) BY MOUTH DAILY. (Patient taking differently: Take 75 mg by mouth at bedtime. ) 90 tablet 3  . [employer] IRON 325 (65 Fe) MG tablet TAKE 1 TABLET BY MOUTH 3 TIMES DAILY WITH MEALS. 270 tablet 3  . cyclobenzaprine (FLEXERIL) 10 MG tablet     . fludrocortisone (FLORINEF) 0.1 MG tablet Take 1 tablet (0.1 mg  total) by mouth daily. 30 tablet 11  . FLUOCINOLONE ACETONIDE BODY 0.01 % OIL Apply 1 application topically daily as needed (for dry skin).     [name] FLUoxetine (PROZAC) 40 MG capsule Take 1 capsule (40 mg total) by mouth every morning. 90 capsule 1  . furosemide (LASIX) 20 MG tablet Take 1 tablet (20 mg total) by mouth daily. 90 tablet 3  . Krill Oil 500 MG CAPS Take 500 mg by mouth daily.    . midodrine (PROAMATINE) 5 MG tablet TAKE 1 TABLET (5 MG TOTAL) BY MOUTH 2 (TWO) TIMES DAILY WITH BREAKFAST AND LUNCH. 180 tablet 3  . mirtazapine (REMERON) 15 MG tablet Take 1 tablet (15 mg total) by mouth at bedtime. 90 tablet 1  . Multiple Vitamins-Minerals (CENTRUM ADULTS PO) Take 1 tablet by mouth every morning.     [name] NITROSTAT 0.4 MG SL tablet PLACE 1 TABLET (0.4 MG TOTAL) UNDER THE TONGUE EVERY 5 (FIVE) MINUTES AS NEEDED FOR CHEST PAIN. 25 tablet 5  . vitamin B-12 (CYANOCOBALAMIN) 1000 MCG tablet Take 1,000 mcg by mouth at bedtime.      No current facility-administered medications for this visit.     Neurologic: Headache: No Seizure: No Paresthesias:No  Musculoskeletal: Strength & Muscle Tone: within normal limits Gait & Station: normal Patient leans: N/A  Psychiatric Specialty Exam: Review of Systems  Musculoskeletal: Positive for back pain.   Psychiatric/Behavioral: Positive for depression. The patient is nervous/anxious.     Blood pressure 116/70, pulse 81, temperature 98 F (36.7 C), temperature source Oral, weight 161 lb 9.6 oz (73.3 kg).Body mass index is 21.92 kg/m.  General Appearance: Casual  Eye Contact:  Fair  Speech:  Clear and Coherent  Volume:  Normal  Mood:  Anxious  Affect:  Congruent  Thought Process:  Goal Directed  Orientation:  Full (Time, Place, and Person)  Thought Content:  WDL  Suicidal Thoughts:  No  Homicidal Thoughts:  No  Memory:  Immediate;   Fair Recent;   Fair Remote;   Fair  Judgement:  Fair  Insight:  Fair  Psychomotor Activity:  Normal  Concentration:  Concentration: Fair and Attention Span: Fair  Recall:  [employer] of Knowledge:Fair  Language: Fair  Akathisia:  No  Handed:  Right  AIMS (if indicated):    Assets:  Communication Skills Desire for Improvement Physical Health Social Support  ADL's:  Intact  Cognition: WNL  Sleep:  poor    Treatment Plan Summary: Medication management   Discussed with patient at length about the medications treatment risks benefits and alternatives.  Continue  Remeron 15 mg by mouth daily at bedtime to help with his anxiety insomnia and depression. Continue  Prozac 60 mg at this time to help with his anxiety.  D/c Deplin.   He will follow-up in 2 months or earlier depending on his symptoms.     More than 50% of the time spent in psychoeducation, counseling and coordination of care.    This note was generated in part or whole with voice recognition software. Voice regonition is usually quite accurate but there are transcription errors that can and very often do occur. I apologize for any typographical errors that were not detected and corrected.   Rainey Pines, MD 5/23/20181:39 PM

## 2016-09-01 ENCOUNTER — Ambulatory Visit: Payer: Medicare Other | Admitting: Occupational Therapy

## 2016-09-01 DIAGNOSIS — I890 Lymphedema, not elsewhere classified: Secondary | ICD-10-CM

## 2016-09-01 NOTE — Therapy (Signed)
[place] MAIN [facility] SERVICES [address] [place], [place], [address] Phone: [phone]   Fax:  [phone]  Occupational Therapy Treatment  Patient Details  Name: Shawn Lam MRN: [id number] Date of Birth: November 19, 1950 Referring Provider: Algie Coffer, MD  Encounter Date: 09/01/2016      OT End of Session - 09/01/16 1639    Visit Number 4   Number of Visits 12   Date for OT Re-Evaluation 11/15/16   OT Start Time 0300   OT Stop Time 0400   OT Time Calculation (min) 60 min   Activity Tolerance Patient tolerated treatment well;No increased pain   Behavior During Therapy WFL for tasks assessed/performed      Past Medical History:  Diagnosis Date  . Anemia   . Anxiety   . Asymptomatic LV dysfunction    mild with EF 42% by recent nuclear stress test.  . Berger's disease   . CAD (coronary artery disease) 06/2005   PCI of LAD and RCA  . Depression   . Hematuria   . Hyperlipidemia   . Insomnia   . Left ureteral stone   . Orthostatic hypotension    negative tilt table  . OSA (obstructive sleep apnea)    moderate with AHI 17/hr  . PVC's (premature ventricular contractions) 07/07/2016  . Renal disorder    PT IS SEEING DR. Lorrene Reid- NEPHROLOGIST  . Rosacea   . Urticaria     Past Surgical History:  Procedure Laterality Date  . CARDIAC CATHETERIZATION  09/06/2013   [place]  . CARDIAC CATHETERIZATION    . CORONARY ANGIOPLASTY  2004   STENT PLACEMENT  . CYSTOSCOPY WITH RETROGRADE PYELOGRAM, URETEROSCOPY AND STENT PLACEMENT Left 03/19/2014   Procedure: CYSTOSCOPY WITH RETROGRADE PYELOGRAM, URETEROSCOPY AND STENT PLACEMENT;  Surgeon: Junious Dresser, MD;  Location: [facility];  Service: Urology;  Laterality: Left;  . CYSTOSCOPY WITH RETROGRADE PYELOGRAM, URETEROSCOPY AND STENT PLACEMENT Bilateral 05/20/2015   Procedure:  CYSTOSCOPY WITH BILATERAL RETROGRADE PYELOGRAM,RIGHT  DIAGNOSTIC URETEROSCOPY ,LEFT URETEROSCOPY WITH HOLMIUM LASER  AND  BILATERAL  STENT PLACEMENT ;  Surgeon: Alexis Frock, MD;  Location: [facility];  Service: Urology;  Laterality: Bilateral;  . EP IMPLANTABLE DEVICE N/A 05/13/2016   Procedure: Loop Recorder Insertion;  Surgeon: Deboraha Sprang, MD;  Location: [place] [facility];  Service: Cardiovascular;  Laterality: N/A;  . HOLMIUM LASER APPLICATION Bilateral 4/0/8144   Procedure: HOLMIUM LASER APPLICATION;  Surgeon: Alexis Frock, MD;  Location: [facility];  Service: Urology;  Laterality: Bilateral;  . LEFT HEART CATHETERIZATION WITH CORONARY ANGIOGRAM N/A 09/06/2013   Procedure: LEFT HEART CATHETERIZATION WITH CORONARY ANGIOGRAM;  Surgeon: Sinclair Grooms, MD;  Location: [facility] CATH LAB;  Service: Cardiovascular;  Laterality: N/A;  . LEFT KNEE CAP  SURGERY ABOUT 40 YRS AGO  ? 1970    . STONE EXTRACTION WITH BASKET Left 03/19/2014   Procedure: STONE EXTRACTION WITH BASKET;  Surgeon: Junious Dresser, MD;  Location: [facility];  Service: Urology;  Laterality: Left;    There were no vitals filed for this visit.      Subjective Assessment - 09/01/16 1636    Subjective  [name] presents for OT visit 3412 for CDT to treat BLE lymphedema (LE) by Daleen Squibb MD. Pt has no new  complaints since initial OT evaluation . Pt denies noticable increase in LE sweling since last seen.   Pertinent History Pre-diabetesBerger's Disease, CAD 06/2005, OSA, Renal disease, neuropathy   Patient Stated  Goals decrease swelling and keep it from coming back                      OT Treatments/Exercises (OP) - 09/01/16 0001      ADLs   ADL Education Given Yes     Manual Therapy   Manual Therapy Edema management;Manual Lymphatic Drainage (MLD)   Compression Bandaging no compression. Recommended, CCl 2 knee highs have been ordered by Pt report.                OT Education - 09/01/16 1638    Education provided Yes   Education Details Continued skilled Pt/caregiver education  And LE ADL training throughout visit for  lymphedema self care/ home program, including compression wrapping, compression garment and device wear/care, lymphatic pumping ther ex, simple self-MLD, and skin care. Discussed progress towards goals.   Person(s) Educated Patient   Methods Explanation;Demonstration   Comprehension Verbalized understanding;Tactile cues required;Need further instruction             OT Long Term Goals - 08/18/16 0856      OT LONG TERM GOAL #1   Title Pt independent w/ lymphedema precautions and prevention principals and strategies to limit LE progression and infection risk.   Baseline Max A   Time 1   Period Weeks   Status New     OT LONG TERM GOAL #2   Title Lymphedema (LE) management/ self-care: Pt able to apply multi layered, gradient compression wraps w independently using proper techniques within 2 weeks to achieve optimal limb volume reductions bilaterally.   Baseline Max A   Time 2   Period Weeks   Status New     OT LONG TERM GOAL #3   Title Lymphedema (LE) management/ self-care:  Pt to achieve at least 5% limb volume reduction in RLE during modified Intensive CDT to limit LE progression, to reduce pain, and to improve safe ambulation and functional mobility.   Baseline Max A   Time 6   Period Weeks   Status New     OT LONG TERM GOAL #4   Title Lymphedema (LE) management/ self-care:  Pt to tolerate daily compression wraps, compression garments and/ or HOS devices in keeping w/ prescribed wear regime within 1 week of issue date to progress and retain clinical and functional gains and to limit LE progression.   Baseline Max A   Time 6   Period Weeks   Status New     OT LONG TERM GOAL #5   Title Lymphedema (LE) management/ self-care:  During Management Phase CDT Pt to sustain limb volume reduction achieved during INtensive CDT within 5%  to limit LE progression, infection risk and further functional decline.   Baseline Max A   Time 6   Period Weeks   Status New                Plan - 09/01/16 1639    Clinical Impression Statement Pt presenting with only trace swelling at R volar foot/ ankle and lateral calf today. Swelling is so subtle it is difficult to capture with volumetrics. Pt tolerated MLD to RLE today utilizing non-cancer related LE sequence using functional inguinal LN. Pt has ordered compression wraps. We fill fit them ASAP for daytime use. We'll monitor swelling carefully and increase treatment frequency back to 2 x weekly PRN. At present 1 x weekly is sufficient . Will continue  teaching simple self MLD next viit.   Rehab Potential  Good   Clinical Impairments Affecting Rehab Potential Pt presents with mild, stage 1, RLE (dominant side) lymphedema with insideous onset approxcimately 1 month ago. Pt is unaware of a precipitating event. Pt reports recent swelling has reduced to nearlt normal with Lasix diuretic 2 x daily, despite Pt reports that MD's ruled out systemic causes. Pt has minimal family hx off leg swelling.. Tissue is palpably spongy like protien ritch lymphedema, and swelling is fluctuating, in keeping w/ class 1 classification. While etiology is unknown, leg edema may be dramatically exacerbated with 10 mile runds several days weekly. Pt agrees with plan to undergo an abbreviated course of intensive phase  Complete Decongestive Therapy, (CDT) to decrease swelling, learn LE self care/ management, improve tissue integrity and fit with appropriate compression garments for both normal duty and high intensity exercise activities of choice. Without skilled Occupational Therapy for lymphedema care, this chronic , progressive condition is likely to worsen and further functional decline is ex pected.   OT Frequency 2x / week   OT Duration Other (comment)  and PRN for CDT, including MLD, skin care, compression wrapping and ther ex. Fit w/ appropriate compression garments bilaterally.   OT Treatment/Interventions Self-care/ADL training;DME and/or AE  instruction;Patient/family education;Compression bandaging;Therapeutic exercise;Therapeutic activities;Manual Therapy   OT Home Exercise Plan lymphatic pumping therex-BLE- 2 x daily, 2 sets of 10 in sequence      Patient will benefit from skilled therapeutic intervention in order to improve the following deficits and impairments:  Decreased knowledge of use of DME, Decreased skin integrity, Increased edema  Visit Diagnosis: Lymphedema, not elsewhere classified    Problem List Patient Active Problem List   Diagnosis Date Noted  . Lymphedema 07/19/2016  . PVC's (premature ventricular contractions) 07/07/2016  . Hypoalbuminemia 06/28/2016  . IgA nephropathy 06/07/2016  . Bilateral leg edema 05/23/2016  . Syncope and collapse 05/13/2016  . Palpitation 03/15/2016  . Mild depression ([place]) 11/20/2015  . Snoring 02/05/2014  . OSA (obstructive sleep apnea) 02/05/2014  . Orthostatic hypotension 02/07/2013  . Macrocytic anemia 01/31/2013  . Scotoma involving central area 01/29/2013  . Optic neuropathy 01/28/2013  . HEMATURIA UNSPECIFIED 01/21/2010  . Elevated glucose 06/24/2008  . Hyperlipidemia 07/05/2006  . Generalized anxiety disorder 07/05/2006  . Coronary atherosclerosis 07/05/2006  . GERD 07/05/2006    Andrey Spearman, MS, OTR/L, [facility] 09/01/16 4:44 PM  [place] MAIN [facility] SERVICES [address] [place], [place], [address] Phone: [phone]   Fax:  [phone]  Name: Shawn Lam MRN: [id number] Date of Birth: 05/31/1950

## 2016-09-02 ENCOUNTER — Ambulatory Visit
Admission: RE | Admit: 2016-09-02 | Discharge: 2016-09-02 | Disposition: A | Discharge status: Home / Self Care | Payer: Medicare Other | Source: Ambulatory Visit | Attending: Orthopedic Surgery | Admitting: Orthopedic Surgery

## 2016-09-02 DIAGNOSIS — M47816 Spondylosis without myelopathy or radiculopathy, lumbar region: Secondary | ICD-10-CM | POA: Insufficient documentation

## 2016-09-02 DIAGNOSIS — M4854XA Collapsed vertebra, not elsewhere classified, thoracic region, initial encounter for fracture: Secondary | ICD-10-CM | POA: Insufficient documentation

## 2016-09-02 DIAGNOSIS — S39012A Strain of muscle, fascia and tendon of lower back, initial encounter: Secondary | ICD-10-CM

## 2016-09-02 DIAGNOSIS — R600 Localized edema: Secondary | ICD-10-CM | POA: Diagnosis not present

## 2016-09-06 ENCOUNTER — Ambulatory Visit: Payer: Medicare Other | Admitting: Occupational Therapy

## 2016-09-06 DIAGNOSIS — M4854XA Collapsed vertebra, not elsewhere classified, thoracic region, initial encounter for fracture: Secondary | ICD-10-CM | POA: Diagnosis not present

## 2016-09-06 DIAGNOSIS — S22000A Wedge compression fracture of unspecified thoracic vertebra, initial encounter for closed fracture: Secondary | ICD-10-CM | POA: Insufficient documentation

## 2016-09-06 DIAGNOSIS — S39012A Strain of muscle, fascia and tendon of lower back, initial encounter: Secondary | ICD-10-CM | POA: Diagnosis not present

## 2016-09-08 ENCOUNTER — Ambulatory Visit: Payer: Medicare Other | Admitting: Occupational Therapy

## 2016-09-08 DIAGNOSIS — I890 Lymphedema, not elsewhere classified: Secondary | ICD-10-CM

## 2016-09-08 NOTE — Therapy (Signed)
[place] MAIN [facility] SERVICES [address] [place], [place], [address] Phone: [phone]   Fax:  [phone]  Occupational Therapy Treatment  Patient Details  Name: Shawn Lam MRN: [id number] Date of Birth: 07/21/1950 Referring Provider: Algie Coffer, MD  Encounter Date: 09/08/2016      OT End of Session - 09/08/16 1705    Visit Number 5   Number of Visits 12   Date for OT Re-Evaluation 11/15/16   OT Start Time 0315   OT Stop Time 0414   OT Time Calculation (min) 59 min   Activity Tolerance Patient tolerated treatment well;No increased pain   Behavior During Therapy WFL for tasks assessed/performed      Past Medical History:  Diagnosis Date  . Anemia   . Anxiety   . Asymptomatic LV dysfunction    mild with EF 42% by recent nuclear stress test.  . Berger's disease   . CAD (coronary artery disease) 06/2005   PCI of LAD and RCA  . Depression   . Hematuria   . Hyperlipidemia   . Insomnia   . Left ureteral stone   . Orthostatic hypotension    negative tilt table  . OSA (obstructive sleep apnea)    moderate with AHI 17/hr  . PVC's (premature ventricular contractions) 07/07/2016  . Renal disorder    PT IS SEEING DR. Lorrene Reid- NEPHROLOGIST  . Rosacea   . Urticaria     Past Surgical History:  Procedure Laterality Date  . CARDIAC CATHETERIZATION  09/06/2013   [place]  . CARDIAC CATHETERIZATION    . CORONARY ANGIOPLASTY  2004   STENT PLACEMENT  . CYSTOSCOPY WITH RETROGRADE PYELOGRAM, URETEROSCOPY AND STENT PLACEMENT Left 03/19/2014   Procedure: CYSTOSCOPY WITH RETROGRADE PYELOGRAM, URETEROSCOPY AND STENT PLACEMENT;  Surgeon: Junious Dresser, MD;  Location: [facility];  Service: Urology;  Laterality: Left;  . CYSTOSCOPY WITH RETROGRADE PYELOGRAM, URETEROSCOPY AND STENT PLACEMENT Bilateral 05/20/2015   Procedure:  CYSTOSCOPY WITH BILATERAL RETROGRADE PYELOGRAM,RIGHT  DIAGNOSTIC URETEROSCOPY ,LEFT URETEROSCOPY WITH HOLMIUM LASER  AND  BILATERAL  STENT PLACEMENT ;  Surgeon: Alexis Frock, MD;  Location: [facility];  Service: Urology;  Laterality: Bilateral;  . EP IMPLANTABLE DEVICE N/A 05/13/2016   Procedure: Loop Recorder Insertion;  Surgeon: Deboraha Sprang, MD;  Location: [place] [facility];  Service: Cardiovascular;  Laterality: N/A;  . HOLMIUM LASER APPLICATION Bilateral 06/17/7562   Procedure: HOLMIUM LASER APPLICATION;  Surgeon: Alexis Frock, MD;  [facility]: [facility];  Service: Urology;  Laterality: Bilateral;  . LEFT HEART CATHETERIZATION WITH CORONARY ANGIOGRAM N/A 09/06/2013   Procedure: LEFT HEART CATHETERIZATION WITH CORONARY ANGIOGRAM;  Surgeon: Sinclair Grooms, MD;  Location: [facility] C[facility];  Service: Cardiovascular;  Laterality: N/A;  . LEFT KNEE CAP  SURGERY ABOUT 40 YRS AGO  ? 1970    . STONE EXTRACTION WITH BASKET Left 03/19/2014   Procedure: STONE EXTRACTION WITH BASKET;  Surgeon: Junious Dresser, MD;  [facility]: [facility];  Service: Urology;  Laterality: Left;    There were no vitals filed for this visit.      Subjective Assessment - 09/08/16 1705    Subjective  [name] presents for OT visit 63for CDT to treat BLE lymphedema (LE) by Daleen Squibb MD. Pt has no new  complaints since initial OT evaluation . Pt denies noticable increase in LE sweling since last seen.   Pertinent History Pre-diabetesBerger's Disease, CAD 06/2005, OSA, Renal disease, neuropathy   Patient Stated Goals  decrease swelling and keep it from coming back                      OT Treatments/Exercises (OP) - 09/08/16 0001      ADLs   ADL Education Given Yes     Manual Therapy   Manual Therapy Edema management;Manual Lymphatic Drainage (MLD)   Manual Lymphatic Drainage (MLD) MLD to RLE utilizing non-cancer related, ipsilateral pathways to groin level and deeper abdominal lymphatiics.   Compression Bandaging no compression. Recommended, CCl 2 knee highs have been ordered by Pt report.                OT Education -  09/08/16 1706    Education provided Yes   Education Details Emphasis of Pt edu for LE self care today on off-the-shelf sportswear  compression garments to limit swelling      2/2 running on treadmill. Pt agrees to look into compression leggings to combim=ne with  compression socks over the weekend.   Person(s) Educated Patient   Methods Explanation   Comprehension Verbalized understanding             OT Long Term Goals - 08/18/16 0856      OT LONG TERM GOAL #1   Title Pt independent w/ lymphedema precautions and prevention principals and strategies to limit LE progression and infection risk.   Baseline Max A   Time 1   Period Weeks   Status New     OT LONG TERM GOAL #2   Title Lymphedema (LE) management/ self-care: Pt able to apply multi layered, gradient compression wraps w independently using proper techniques within 2 weeks to achieve optimal limb volume reductions bilaterally.   Baseline Max A   Time 2   Period Weeks   Status New     OT LONG TERM GOAL #3   Title Lymphedema (LE) management/ self-care:  Pt to achieve at least 5% limb volume reduction in RLE during modified Intensive CDT to limit LE progression, to reduce pain, and to improve safe ambulation and functional mobility.   Baseline Max A   Time 6   Period Weeks   Status New     OT LONG TERM GOAL #4   Title Lymphedema (LE) management/ self-care:  Pt to tolerate daily compression wraps, compression garments and/ or HOS devices in keeping w/ prescribed wear regime within 1 week of issue date to progress and retain clinical and functional gains and to limit LE progression.   Baseline Max A   Time 6   Period Weeks   Status New     OT LONG TERM GOAL #5   Title Lymphedema (LE) management/ self-care:  During Management Phase CDT Pt to sustain limb volume reduction achieved during INtensive CDT within 5%  to limit LE progression, infection risk and further functional decline.   Baseline Max A   Time 6   Period  Weeks   Status New               Plan - 09/08/16 1709    Clinical Impression Statement Very slight increase in RLE leg below knee and foot today. It's a very subtle increase only visible by noting slight lack of muscle definition in calf and dorsal foot compared with LLE. Pt  tolerated MLD without difficulty. No change in limb volume palpable or visible after treatment. Cont as per POC.    Rehab Potential Good   Current Impairments/barriers affecting progress: Pt presents with  mild, stage 1, RLE (dominant side) lymphedema with insideous onset approxcimately 1 month ago. Pt is unaware of a precipitating event. Pt reports recent swelling has reduced to nearlt normal with Lasix diuretic 2 x daily, despite Pt reports that MD's ruled out systemic causes. Pt has minimal family hx off leg swelling.. Tissue is palpably spongy like protien ritch lymphedema, and swelling is fluctuating, in keeping w/ class 1 classification. While etiology is unknown, leg edema may be dramatically exacerbated with 10 mile runds several days weekly. Pt agrees with plan to undergo an abbreviated course of intensive phase  Complete Decongestive Therapy, (CDT) to decrease swelling, learn LE self care/ management, improve tissue integrity and fit with appropriate compression garments for both normal duty and high intensity exercise activities of choice. Without skilled Occupational Therapy for lymphedema care, this chronic , progressive condition is likely to worsen and further functional decline is ex pected.   OT Frequency 2x / week   OT Duration Other (comment)  and PRN for CDT, including MLD, skin care, compression wrapping and ther ex. Fit w/ appropriate compression garments bilaterally.   OT Treatment/Interventions Self-care/ADL training;DME and/or AE instruction;Patient/family education;Compression bandaging;Therapeutic exercise;Therapeutic activities;Manual Therapy   OT Home Exercise Plan lymphatic pumping therex-BLE- 2  x daily, 2 sets of 10 in sequence      Patient will benefit from skilled therapeutic intervention in order to improve the following deficits and impairments:  Decreased knowledge of use of DME, Decreased skin integrity, Increased edema  Visit Diagnosis: Lymphedema    Problem List Patient Active Problem List   Diagnosis Date Noted  . Lymphedema 07/19/2016  . PVC's (premature ventricular contractions) 07/07/2016  . Hypoalbuminemia 06/28/2016  . IgA nephropathy 06/07/2016  . Bilateral leg edema 05/23/2016  . Syncope and collapse 05/13/2016  . Palpitation 03/15/2016  . Mild depression ([place]) 11/20/2015  . Snoring 02/05/2014  . OSA (obstructive sleep apnea) 02/05/2014  . Orthostatic hypotension 02/07/2013  . Macrocytic anemia 01/31/2013  . Scotoma involving central area 01/29/2013  . Optic neuropathy 01/28/2013  . HEMATURIA UNSPECIFIED 01/21/2010  . Elevated glucose 06/24/2008  . Hyperlipidemia 07/05/2006  . Generalized anxiety disorder 07/05/2006  . Coronary atherosclerosis 07/05/2006  . GERD 07/05/2006    Andrey Spearman, MS, OTR/L, [facility] 09/08/16 5:13 PM  [place] MAIN [facility] SERVICES [address] [place], [place], [address] Phone: [phone]   Fax:  [phone]  Name: SERAPIO EDELSON MRN: [id number] Date of Birth: [birth year]

## 2016-09-12 ENCOUNTER — Encounter: Payer: Medicare Other | Admitting: Occupational Therapy

## 2016-09-12 ENCOUNTER — Ambulatory Visit (INDEPENDENT_AMBULATORY_CARE_PROVIDER_SITE_OTHER): Payer: Medicare Other | Admitting: *Deleted

## 2016-09-12 DIAGNOSIS — R002 Palpitations: Secondary | ICD-10-CM | POA: Diagnosis not present

## 2016-09-12 NOTE — Progress Notes (Signed)
[facility] Summary Report / Loop Recorder 

## 2016-09-15 ENCOUNTER — Ambulatory Visit: Payer: Medicare Other | Attending: Physician Assistant | Admitting: Occupational Therapy

## 2016-09-15 DIAGNOSIS — I890 Lymphedema, not elsewhere classified: Secondary | ICD-10-CM | POA: Diagnosis not present

## 2016-09-15 NOTE — Telephone Encounter (Signed)
Printed Rx for alprazolam was returned to me by administrative staff due to Rx was never picked up.  There was relief staff working with Dr. Randa Spike that day. I called [facility] to verify if the alprazolam was called in on 11/24/2015.  Per Jose at [facility] alprazolam was called in on 11/24/2015.  Printed Rx shredded.

## 2016-09-15 NOTE — Therapy (Signed)
[place] MAIN [facility] SERVICES [address] [place], [place], [address] Phone: [phone]   Fax:  [phone]  Occupational Therapy Treatment  Patient Details  Name: Shawn Lam MRN: [id number] Date of Birth: 1951/01/19 Referring Provider: Algie Coffer, MD  Encounter Date: 09/15/2016      OT End of Session - 09/15/16 1715    Visit Number 6   Number of Visits 12   Date for OT Re-Evaluation 11/15/16   OT Start Time 0300   OT Stop Time 0357   OT Time Calculation (min) 57 min   Activity Tolerance Patient tolerated treatment well;No increased pain   Behavior During Therapy WFL for tasks assessed/performed      Past Medical History:  Diagnosis Date  . Anemia   . Anxiety   . Asymptomatic LV dysfunction    mild with EF 42% by recent nuclear stress test.  . Berger's disease   . CAD (coronary artery disease) 06/2005   PCI of LAD and RCA  . Depression   . Hematuria   . Hyperlipidemia   . Insomnia   . Left ureteral stone   . Orthostatic hypotension    negative tilt table  . OSA (obstructive sleep apnea)    moderate with AHI 17/hr  . PVC's (premature ventricular contractions) 07/07/2016  . Renal disorder    PT IS SEEING DR. Lorrene Reid- NEPHROLOGIST  . Rosacea   . Urticaria     Past Surgical History:  Procedure Laterality Date  . CARDIAC CATHETERIZATION  09/06/2013   [place]  . CARDIAC CATHETERIZATION    . CORONARY ANGIOPLASTY  2004   STENT PLACEMENT  . CYSTOSCOPY WITH RETROGRADE PYELOGRAM, URETEROSCOPY AND STENT PLACEMENT Left 03/19/2014   Procedure: CYSTOSCOPY WITH RETROGRADE PYELOGRAM, URETEROSCOPY AND STENT PLACEMENT;  Surgeon: Junious Dresser, MD;  Location: [facility];  Service: Urology;  Laterality: Left;  . CYSTOSCOPY WITH RETROGRADE PYELOGRAM, URETEROSCOPY AND STENT PLACEMENT Bilateral 05/20/2015   Procedure:  CYSTOSCOPY WITH BILATERAL RETROGRADE PYELOGRAM,RIGHT  DIAGNOSTIC URETEROSCOPY ,LEFT URETEROSCOPY WITH HOLMIUM LASER  AND  BILATERAL  STENT PLACEMENT ;  Surgeon: Alexis Frock, MD;  Location: [facility];  Service: Urology;  Laterality: Bilateral;  . EP IMPLANTABLE DEVICE N/A 05/13/2016   Procedure: Loop Recorder Insertion;  Surgeon: Deboraha Sprang, MD;  Location: [place] CV LAB;  Service: Cardiovascular;  Laterality: N/A;  . HOLMIUM LASER APPLICATION Bilateral 0/09/2692   Procedure: HOLMIUM LASER APPLICATION;  Surgeon: Alexis Frock, MD;  Location: [facility];  Service: Urology;  Laterality: Bilateral;  . LEFT HEART CATHETERIZATION WITH CORONARY ANGIOGRAM N/A 09/06/2013   Procedure: LEFT HEART CATHETERIZATION WITH CORONARY ANGIOGRAM;  Surgeon: Sinclair Grooms, MD;  Location: [facility] CATH LAB;  Service: Cardiovascular;  Laterality: N/A;  . LEFT KNEE CAP  SURGERY ABOUT 40 YRS AGO  ? 1970    . STONE EXTRACTION WITH BASKET Left 03/19/2014   Procedure: STONE EXTRACTION WITH BASKET;  Surgeon: Junious Dresser, MD;  Location: [facility];  Service: Urology;  Laterality: Left;    There were no vitals filed for this visit.      Subjective Assessment - 09/15/16 1713    Subjective  [name] presents for OT visit 6 for CDT to treat BLE lymphedema (LE) by Daleen Squibb MD. Pt reports one episode of RLE swelling at distal leg and ankle since last visit without precipitating event. Pt  reports compression knee highs he ordered 2 weeks ago have not yet arrived. He has been wearing  OTS athletic compression knee highs.   Pertinent History Pre-diabetesBerger's Disease, CAD 06/2005, OSA, Renal disease, neuropathy   Patient Stated Goals decrease swelling and keep it from coming back   Currently in Pain? No/denies                      OT Treatments/Exercises (OP) - 09/15/16 0001      ADLs   ADL Education Given Yes     Manual Therapy   Manual Therapy Edema management;Manual Lymphatic Drainage (MLD)   Manual Lymphatic Drainage (MLD) MLD to RLE utilizing non-cancer related, ipsilateral pathways to groin level and deeper abdominal  lymphatiics.   Compression Bandaging no compression. Recommended, CCl 2 knee highs have been ordered by Pt report.                OT Education - 09/15/16 1715    Education provided Yes   Education Details Emphasis on LE ADL instruction on simple self MLD using non cancer-related sequences.   Person(s) Educated Patient   Methods Explanation;Demonstration;Tactile cues;Verbal cues;Handout   Comprehension Verbalized understanding;Returned demonstration;Verbal cues required;Tactile cues required;Need further instruction             OT Long Term Goals - 08/18/16 0856      OT LONG TERM GOAL #1   Title Pt independent w/ lymphedema precautions and prevention principals and strategies to limit LE progression and infection risk.   Baseline Max A   Time 1   Period Weeks   Status New     OT LONG TERM GOAL #2   Title Lymphedema (LE) management/ self-care: Pt able to apply multi layered, gradient compression wraps w independently using proper techniques within 2 weeks to achieve optimal limb volume reductions bilaterally.   Baseline Max A   Time 2   Period Weeks   Status New     OT LONG TERM GOAL #3   Title Lymphedema (LE) management/ self-care:  Pt to achieve at least 5% limb volume reduction in RLE during modified Intensive CDT to limit LE progression, to reduce pain, and to improve safe ambulation and functional mobility.   Baseline Max A   Time 6   Period Weeks   Status New     OT LONG TERM GOAL #4   Title Lymphedema (LE) management/ self-care:  Pt to tolerate daily compression wraps, compression garments and/ or HOS devices in keeping w/ prescribed wear regime within 1 week of issue date to progress and retain clinical and functional gains and to limit LE progression.   Baseline Max A   Time 6   Period Weeks   Status New     OT LONG TERM GOAL #5   Title Lymphedema (LE) management/ self-care:  During Management Phase CDT Pt to sustain limb volume reduction achieved  during INtensive CDT within 5%  to limit LE progression, infection risk and further functional decline.   Baseline Max A   Time 6   Period Weeks   Status New               Plan - 09/15/16 1716    Clinical Impression Statement Trace visible swelling noted at anterior distal calf, ankle and foot today. Very minimal presentation with only softened profile around boney prominences noted. Emphasis of visit today on  MLD and teaching Pt simple self-MLE. By end of session Pt able to perform sequences with ongoing verbal and tactile cues and demonstration. Pt will practice during visit interval. Cont as per  POC.    Occupational performance deficits (Please refer to evaluation for details): ADL's;Leisure   Rehab Potential Good   Current Impairments/barriers affecting progress: Pt presents with mild, stage 1, RLE (dominant side) lymphedema with insideous onset approxcimately 1 month ago. Pt is unaware of a precipitating event. Pt reports recent swelling has reduced to nearlt normal with Lasix diuretic 2 x daily, despite Pt reports that MD's ruled out systemic causes. Pt has minimal family hx off leg swelling.. Tissue is palpably spongy like protien ritch lymphedema, and swelling is fluctuating, in keeping w/ class 1 classification. While etiology is unknown, leg edema may be dramatically exacerbated with 10 mile runds several days weekly. Pt agrees with plan to undergo an abbreviated course of intensive phase  Complete Decongestive Therapy, (CDT) to decrease swelling, learn LE self care/ management, improve tissue integrity and fit with appropriate compression garments for both normal duty and high intensity exercise activities of choice. Without skilled Occupational Therapy for lymphedema care, this chronic , progressive condition is likely to worsen and further functional decline is ex pected.   OT Frequency 2x / week   OT Duration Other (comment)  and PRN for CDT, including MLD, skin care, compression  wrapping and ther ex. Fit w/ appropriate compression garments bilaterally.   OT Treatment/Interventions Self-care/ADL training;DME and/or AE instruction;Patient/family education;Compression bandaging;Therapeutic exercise;Therapeutic activities;Manual Therapy   OT Home Exercise Plan lymphatic pumping therex-BLE- 2 x daily, 2 sets of 10 in sequence      Patient will benefit from skilled therapeutic intervention in order to improve the following deficits and impairments:  Decreased knowledge of use of DME, Decreased skin integrity, Increased edema  Visit Diagnosis: Lymphedema, not elsewhere classified    Problem List Patient Active Problem List   Diagnosis Date Noted  . Lymphedema 07/19/2016  . PVC's (premature ventricular contractions) 07/07/2016  . Hypoalbuminemia 06/28/2016  . IgA nephropathy 06/07/2016  . Bilateral leg edema 05/23/2016  . Syncope and collapse 05/13/2016  . Palpitation 03/15/2016  . Mild depression ([place]) 11/20/2015  . Snoring 02/05/2014  . OSA (obstructive sleep apnea) 02/05/2014  . Orthostatic hypotension 02/07/2013  . Macrocytic anemia 01/31/2013  . Scotoma involving central area 01/29/2013  . Optic neuropathy 01/28/2013  . HEMATURIA UNSPECIFIED 01/21/2010  . Elevated glucose 06/24/2008  . Hyperlipidemia 07/05/2006  . Generalized anxiety disorder 07/05/2006  . Coronary atherosclerosis 07/05/2006  . GERD 07/05/2006    Andrey Spearman, MS, OTR/L, [facility] 09/15/16 5:20 PM  [place] MAIN [facility] SERVICES [address] [place], [place], [address] Phone: [phone]   Fax:  [phone]  Name: KOA ZOELLER MRN: [id number] Date of Birth: March 03, 1951

## 2016-09-19 ENCOUNTER — Encounter: Payer: Medicare Other | Admitting: Occupational Therapy

## 2016-09-22 ENCOUNTER — Ambulatory Visit: Payer: Medicare Other | Admitting: Occupational Therapy

## 2016-09-22 DIAGNOSIS — M4854XA Collapsed vertebra, not elsewhere classified, thoracic region, initial encounter for fracture: Secondary | ICD-10-CM | POA: Diagnosis not present

## 2016-09-26 ENCOUNTER — Encounter: Payer: Medicare Other | Admitting: Occupational Therapy

## 2016-09-27 LAB — CUP PACEART REMOTE DEVICE CHECK
Date Time Interrogation Session: 20180602153737
Implantable Pulse Generator Implant Date: 20180202

## 2016-09-29 ENCOUNTER — Ambulatory Visit: Payer: Medicare Other | Admitting: Occupational Therapy

## 2016-10-06 ENCOUNTER — Ambulatory Visit (INDEPENDENT_AMBULATORY_CARE_PROVIDER_SITE_OTHER): Payer: Medicare Other | Admitting: Internal Medicine

## 2016-10-06 ENCOUNTER — Encounter: Payer: Self-pay | Admitting: Internal Medicine

## 2016-10-06 VITALS — BP 118/78 | HR 64 | Ht 72.0 in | Wt 171.4 lb

## 2016-10-06 DIAGNOSIS — I493 Ventricular premature depolarization: Secondary | ICD-10-CM

## 2016-10-06 DIAGNOSIS — I2510 Atherosclerotic heart disease of native coronary artery without angina pectoris: Secondary | ICD-10-CM

## 2016-10-06 DIAGNOSIS — I951 Orthostatic hypotension: Secondary | ICD-10-CM | POA: Diagnosis not present

## 2016-10-06 DIAGNOSIS — R002 Palpitations: Secondary | ICD-10-CM

## 2016-10-06 LAB — CUP PACEART INCLINIC DEVICE CHECK
Implantable Pulse Generator Implant Date: 20180202
MDC IDC SESS DTM: 20180628145312

## 2016-10-06 NOTE — Patient Instructions (Signed)
Medication Instructions:  Your physician has recommended you make the following change in your medication: 1.) STOP Florinef   Labwork: Your physician recommends that you return for lab work today for BMET, TSH   Testing/Procedures: None Ordered   Follow-Up: Your physician wants you to follow-up in: 1 year with Dr. Caryl Comes. You will receive a reminder letter in the mail two months in advance. If you don't receive a letter, please call our office to schedule the follow-up appointment.   Any Other Special Instructions Will Be Listed Below (If Applicable).     If you need a refill on your cardiac medications before your next appointment, please call your pharmacy.

## 2016-10-06 NOTE — Progress Notes (Signed)
Patient Care Team: Jinny Sanders, MD as PCP - General   HPI  Shawn Lam is a 66 y.o. male Seen in follow-up for orthostasis in the context of known coronary artery disease with prior stenting.  He is also had problems with palpitations with documented PVCs including some with couplets. He underwent loop recorder insertion and has had some symptomatic palpitations since then.  For his orthostasis he has been managed with Florinef and ProAmatine. He takes his ProAmatine at night. He more recently has developed peripheral edema on Florinef and Lasix has been prescribed  He is 6 years status post stenting he is still a DAPT \ DATE TEST    5/15 MRI   EF 59 %   11/17    Myoview   EF 42 % No ischemia  6/18 Echo EF 50% Inferior HK   He has Had some palpitations typical of his events. He activated his event recorder.   He denies chest pain.   He's been having problems with peripheral edema. He is a started on Lasix.  Continues with orthostatic lightheadedness. This is worse after midday meal. He takes his second dose of ProAmatine at night  Most recent creatinine 5/18 1.72---1.4 not sure why what it went up  Records and Results Reviewed outside records   Past Medical History:  Diagnosis Date  . Anemia   . Anxiety   . Asymptomatic LV dysfunction    mild with EF 42% by recent nuclear stress test.  . Berger's disease   . CAD (coronary artery disease) 06/2005   PCI of LAD and RCA  . Depression   . Hematuria   . Hyperlipidemia   . Insomnia   . Left ureteral stone   . Orthostatic hypotension    negative tilt table  . OSA (obstructive sleep apnea)    moderate with AHI 17/hr  . PVC's (premature ventricular contractions) 07/07/2016  . Renal disorder    PT IS SEEING DR. Lorrene Reid- NEPHROLOGIST  . Rosacea   . Urticaria     Past Surgical History:  Procedure Laterality Date  . CARDIAC CATHETERIZATION  09/06/2013   [place]  . CARDIAC CATHETERIZATION    . CORONARY  ANGIOPLASTY  2004   STENT PLACEMENT  . CYSTOSCOPY WITH RETROGRADE PYELOGRAM, URETEROSCOPY AND STENT PLACEMENT Left 03/19/2014   Procedure: CYSTOSCOPY WITH RETROGRADE PYELOGRAM, URETEROSCOPY AND STENT PLACEMENT;  Surgeon: Junious Dresser, MD;  Location: [facility];  Service: Urology;  Laterality: Left;  . CYSTOSCOPY WITH RETROGRADE PYELOGRAM, URETEROSCOPY AND STENT PLACEMENT Bilateral 05/20/2015   Procedure:  CYSTOSCOPY WITH BILATERAL RETROGRADE PYELOGRAM,RIGHT  DIAGNOSTIC URETEROSCOPY ,LEFT URETEROSCOPY WITH HOLMIUM LASER  AND BILATERAL  STENT PLACEMENT ;  Surgeon: Alexis Frock, MD;  Location: [facility];  Service: Urology;  Laterality: Bilateral;  . EP IMPLANTABLE DEVICE N/A 05/13/2016   Procedure: Loop Recorder Insertion;  Surgeon: Deboraha Sprang, MD;  Location: [place] CV LAB;  Service: Cardiovascular;  Laterality: N/A;  . HOLMIUM LASER APPLICATION Bilateral 12/12/4266   Procedure: HOLMIUM LASER APPLICATION;  Surgeon: Alexis Frock, MD;  Location: [facility];  Service: Urology;  Laterality: Bilateral;  . LEFT HEART CATHETERIZATION WITH CORONARY ANGIOGRAM N/A 09/06/2013   Procedure: LEFT HEART CATHETERIZATION WITH CORONARY ANGIOGRAM;  Surgeon: Sinclair Grooms, MD;  Location: [facility] CATH LAB;  Service: Cardiovascular;  Laterality: N/A;  . LEFT KNEE CAP  SURGERY ABOUT 40 YRS AGO  ? 1970    . STONE EXTRACTION WITH BASKET Left 03/19/2014  Procedure: STONE EXTRACTION WITH BASKET;  Surgeon: Junious Dresser, MD;  Location: [facility];  Service: Urology;  Laterality: Left;    Current Outpatient Prescriptions  Medication Sig Dispense Refill  . aspirin EC 81 MG tablet Take 81 mg by mouth at bedtime.     [name] atorvastatin (LIPITOR) 40 MG tablet TAKE 1 TABLET BY MOUTH DAILY. 90 tablet 3  . clopidogrel (PLAVIX) 75 MG tablet TAKE 1 TABLET (75 MG TOTAL) BY MOUTH DAILY. 90 tablet 3  . [employer] IRON 325 (65 Fe) MG tablet TAKE 1 TABLET BY MOUTH 3 TIMES DAILY WITH MEALS. 270 tablet 3  . cyclobenzaprine (FLEXERIL) 10 MG tablet      . fludrocortisone (FLORINEF) 0.1 MG tablet Take 1 tablet (0.1 mg total) by mouth daily. 30 tablet 11  . FLUOCINOLONE ACETONIDE BODY 0.01 % OIL Apply 1 application topically daily as needed (for dry skin).     [name] FLUoxetine (PROZAC) 40 MG capsule Take 1 capsule (40 mg total) by mouth every morning. 90 capsule 1  . furosemide (LASIX) 20 MG tablet Take 1 tablet (20 mg total) by mouth daily. 90 tablet 3  . Krill Oil 500 MG CAPS Take 500 mg by mouth daily.    . midodrine (PROAMATINE) 5 MG tablet TAKE 1 TABLET (5 MG TOTAL) BY MOUTH 2 (TWO) TIMES DAILY WITH BREAKFAST AND LUNCH. 180 tablet 3  . mirtazapine (REMERON) 15 MG tablet Take 1 tablet (15 mg total) by mouth at bedtime. 90 tablet 1  . Multiple Vitamins-Minerals (CENTRUM ADULTS PO) Take 1 tablet by mouth every morning.     [name] NITROSTAT 0.4 MG SL tablet PLACE 1 TABLET (0.4 MG TOTAL) UNDER THE TONGUE EVERY 5 (FIVE) MINUTES AS NEEDED FOR CHEST PAIN. 25 tablet 5  . vitamin B-12 (CYANOCOBALAMIN) 1000 MCG tablet Take 1,000 mcg by mouth at bedtime.      No current facility-administered medications for this visit.     Allergies  Allergen Reactions  . Codeine Nausea Only  . Tape Other (See Comments)    Use only paper tape. Severe blistering and bruising with other tapes. No cloth tape.      Review of Systems negative except from HPI and PMH  Physical Exam BP 118/78   Pulse 64   Ht 6' (1.829 m)   Wt 171 lb 6 oz (77.7 kg)   SpO2 96%   BMI 23.24 kg/m  Well developed and well nourished in no acute distress HENT normal E scleral and icterus clear Neck Supple JVP flat; carotids brisk and full Clear to ausculation Regular rate and rhythm, no murmurs gallops or rub Soft with active bowel sounds No clubbing cyanosis 1= Edema Alert and oriented, grossly normal motor and sensory function Skin Warm and Dry    Assessment and  Plan Palpitations  PVCs Couplets  CAD with prior stenting  LV dysfunction - discordant data  Orthostatic  intolerance  Renal insufficiency-acute   Peripheral edema  Elevated TSH    His Florinef may be associated with salt and water retention for which furosemide has been prescribed. We will have him stop his Florinef. Hopefully he will also be able to stop his Lasix.  His orthostasis may be aggravated post midday meal because of splanchnic shunting. We will have him take his ProAmatine upon arising at 0500 and his next dose with his midday meal  He is also on DAPT. I wonder now after 6 years with repeated stop one of his antiplatelet agents. I will discuss  this with Dr. Radford Pax.  We will check his thyroid at his most recently it was elevated.  We will also recheck his metabolic profile as his creatinine had increased significantly prior to his last check about 6 weeks ago he has follow-up scheduled with Dr. Idolina Primer in about 3 months       Current medicines are reviewed at length with the patient today .  The patient does have concerns regarding medicines.

## 2016-10-07 LAB — BASIC METABOLIC PANEL
BUN / CREAT RATIO: 11 (ref 10–24)
BUN: 17 mg/dL (ref 8–27)
CO2: 23 mmol/L (ref 20–29)
Calcium: 9 mg/dL (ref 8.6–10.2)
Chloride: 103 mmol/L (ref 96–106)
Creatinine, Ser: 1.49 mg/dL — ABNORMAL HIGH (ref 0.76–1.27)
GFR calc Af Amer: 56 mL/min/{1.73_m2} — ABNORMAL LOW (ref >59)
GFR, EST NON AFRICAN AMERICAN: 48 mL/min/{1.73_m2} — AB (ref >59)
GLUCOSE: 92 mg/dL (ref 65–99)
POTASSIUM: 4.3 mmol/L (ref 3.5–5.2)
SODIUM: 144 mmol/L (ref 134–144)

## 2016-10-07 LAB — TSH: TSH: 5.38 u[IU]/mL — AB (ref 0.450–4.500)

## 2016-10-10 ENCOUNTER — Encounter: Payer: Self-pay | Admitting: Internal Medicine

## 2016-10-10 ENCOUNTER — Ambulatory Visit (INDEPENDENT_AMBULATORY_CARE_PROVIDER_SITE_OTHER): Payer: Medicare Other | Admitting: *Deleted

## 2016-10-10 DIAGNOSIS — R002 Palpitations: Secondary | ICD-10-CM | POA: Diagnosis not present

## 2016-10-10 NOTE — Progress Notes (Signed)
[facility] Summary Report / Loop Recorder 

## 2016-10-13 ENCOUNTER — Telehealth: Payer: Self-pay

## 2016-10-13 ENCOUNTER — Encounter: Payer: Self-pay | Admitting: Family Medicine

## 2016-10-13 DIAGNOSIS — R7989 Other specified abnormal findings of blood chemistry: Secondary | ICD-10-CM

## 2016-10-13 NOTE — Telephone Encounter (Signed)
Pt is aware and agreeable to elevated TSH and normal bmet. Also aware that PCP has been sent copy of labs and will be consulted for treatment and/or follow up

## 2016-10-20 DIAGNOSIS — M4854XA Collapsed vertebra, not elsewhere classified, thoracic region, initial encounter for fracture: Secondary | ICD-10-CM | POA: Diagnosis not present

## 2016-10-26 LAB — CUP PACEART REMOTE DEVICE CHECK
Date Time Interrogation Session: 20180702160747
MDC IDC PG IMPLANT DT: 20180202

## 2016-10-28 ENCOUNTER — Other Ambulatory Visit (INDEPENDENT_AMBULATORY_CARE_PROVIDER_SITE_OTHER): Payer: Medicare Other

## 2016-10-28 ENCOUNTER — Telehealth: Payer: Self-pay | Admitting: Cardiology

## 2016-10-28 DIAGNOSIS — R7989 Other specified abnormal findings of blood chemistry: Secondary | ICD-10-CM

## 2016-10-28 DIAGNOSIS — R946 Abnormal results of thyroid function studies: Secondary | ICD-10-CM | POA: Diagnosis not present

## 2016-10-28 LAB — T3, FREE: T3, Free: 2.4 pg/mL (ref 2.3–4.2)

## 2016-10-28 LAB — T4, FREE: FREE T4: 0.59 ng/dL — AB (ref 0.60–1.60)

## 2016-10-28 LAB — TSH: TSH: 5.29 u[IU]/mL — ABNORMAL HIGH (ref 0.35–4.50)

## 2016-10-28 NOTE — Telephone Encounter (Signed)
Informed patient that Dr. Radford Pax cleared him for procedure.  Advised to have surgeon's office fax clearance request if needed. Pt appreciates the call.

## 2016-10-28 NOTE — Telephone Encounter (Signed)
Patient is at low  risk from a cardiac standpoint for back surgery.  He has known CAD s/p PCI of the LAD and RCA with patent stents at cath in 2015 and nuclear stress test showed no ischemia on 02/2016.  2D echo 06/2016 showed low normal LVF with EF 50-55%.

## 2016-10-28 NOTE — Telephone Encounter (Signed)
°  New Prob   Pt is due to have back surgery on 7/30 but states he needs to be seen for clearance. All PA/NPs are booked at this time. Dr. Radford Pax also does not have any availability. Requesting to speak to a nurse regarding this.

## 2016-10-31 ENCOUNTER — Ambulatory Visit (INDEPENDENT_AMBULATORY_CARE_PROVIDER_SITE_OTHER): Payer: Medicare Other | Admitting: Psychiatry

## 2016-10-31 ENCOUNTER — Encounter: Payer: Self-pay | Admitting: Psychiatry

## 2016-10-31 VITALS — BP 112/66 | HR 67 | Ht 69.25 in | Wt 165.0 lb

## 2016-10-31 DIAGNOSIS — F39 Unspecified mood [affective] disorder: Secondary | ICD-10-CM

## 2016-10-31 DIAGNOSIS — I2510 Atherosclerotic heart disease of native coronary artery without angina pectoris: Secondary | ICD-10-CM | POA: Diagnosis not present

## 2016-10-31 DIAGNOSIS — F411 Generalized anxiety disorder: Secondary | ICD-10-CM | POA: Diagnosis not present

## 2016-10-31 MED ORDER — FLUOXETINE HCL 20 MG PO CAPS: 20.0000 mg | ORAL_CAPSULE | Freq: Every day | ORAL | 1 refills | Status: DC

## 2016-10-31 MED ORDER — MIRTAZAPINE 15 MG PO TABS: 15.0000 mg | ORAL_TABLET | Freq: Every day | ORAL | 1 refills | Status: DC

## 2016-10-31 MED ORDER — FLUOXETINE HCL 40 MG PO CAPS: 40.0000 mg | ORAL_CAPSULE | Freq: Every morning | ORAL | 1 refills | Status: DC

## 2016-10-31 NOTE — Progress Notes (Signed)
Psychiatric MD Progress Note   Patient Identification: Shawn Lam MRN:  [id number] Date of Evaluation:  10/31/2016 Referral Source: Stanton Kidney- Therapist Chief Complaint:   Chief Complaint    Follow-up     Visit Diagnosis:    ICD-10-CM   1. Episodic mood disorder ([place]) F39   2. Generalized anxiety disorder F41.1     History of Present Illness:    Patient is a 66 year old married male who presented for follow-up. He reported that he Is going to have the back surgery done next Monday as the orthopedic surgeon has found a fracture in his back recently. He reported that he was having back pain and when he went to the orthopedics that notified him of the same. Patient reported that it was very unusual as he did not have any trauma to his back. He reported that he is taking pain medications at this time. He reported that the sweating in his leg is also getting better. He reported that he has stopped taking the [place] as it was very expensive and he was not able to afford the medication although it was helping with his depression. He has been compliant with Prozac 60 mg and Remeron and they are helping him with his symptoms. He reported that he sleeps well at night. He is currently walking on the treadmill and is not running. He appeared calm and alert during the interview and was able to discuss his symptoms. He denied having any perceptual disturbances. He denied having any suicidal homicidal ideations or plans.        Associated Signs/Symptoms: Depression Symptoms:  fatigue, anxiety, decreased appetite, (Hypo) Manic Symptoms:  Labiality of Mood, Anxiety Symptoms:  Excessive Worry, Psychotic Symptoms:  none PTSD Symptoms: Negative NA  Past Psychiatric History:  Pt reported history of anxiety and has been taking medications including Zoloft and Xanax prescribed by his primary care physician. He denied any previous history of psychiatric hospitalization. No history of suicide  attempt.  Previous Psychotropic Medications: Xanax 0.5 mg by mouth twice a day Zoloft 100 mg by mouth twice a day   Substance Abuse History in the last 12 months:  Yes.   3 beers/week  Consequences of Substance Abuse: Negative NA  Past Medical History:  Past Medical History:  Diagnosis Date  . Anemia   . Anxiety   . Asymptomatic LV dysfunction    mild with EF 42% by recent nuclear stress test.  . Berger's disease   . CAD (coronary artery disease) 06/2005   PCI of LAD and RCA  . Depression   . Hematuria   . Hyperlipidemia   . Insomnia   . Left ureteral stone   . Orthostatic hypotension    negative tilt table  . OSA (obstructive sleep apnea)    moderate with AHI 17/hr  . PVC's (premature ventricular contractions) 07/07/2016  . Renal disorder    PT IS SEEING DR. Lorrene Reid- NEPHROLOGIST  . Rosacea   . Urticaria     Past Surgical History:  Procedure Laterality Date  . CARDIAC CATHETERIZATION  09/06/2013   [place]  . CARDIAC CATHETERIZATION    . CORONARY ANGIOPLASTY  2004   STENT PLACEMENT  . CYSTOSCOPY WITH RETROGRADE PYELOGRAM, URETEROSCOPY AND STENT PLACEMENT Left 03/19/2014   Procedure: CYSTOSCOPY WITH RETROGRADE PYELOGRAM, URETEROSCOPY AND STENT PLACEMENT;  Surgeon: Junious Dresser, MD;  Location: [facility];  Service: Urology;  Laterality: Left;  . CYSTOSCOPY WITH RETROGRADE PYELOGRAM, URETEROSCOPY AND STENT PLACEMENT Bilateral 05/20/2015   Procedure:  CYSTOSCOPY WITH BILATERAL RETROGRADE PYELOGRAM,RIGHT  DIAGNOSTIC URETEROSCOPY ,LEFT URETEROSCOPY WITH HOLMIUM LASER  AND BILATERAL  STENT PLACEMENT ;  Surgeon: Alexis Frock, MD;  Location: [facility];  Service: Urology;  Laterality: Bilateral;  . EP IMPLANTABLE DEVICE N/A 05/13/2016   Procedure: Loop Recorder Insertion;  Surgeon: Deboraha Sprang, MD;  Location: [facility];  Service: Cardiovascular;  Laterality: N/A;  . HOLMIUM LASER APPLICATION Bilateral 09/16/5914   Procedure: HOLMIUM LASER APPLICATION;  Surgeon: Alexis Frock, MD;  Location: [facility];  Service: Urology;  Laterality: Bilateral;  . LEFT HEART CATHETERIZATION WITH CORONARY ANGIOGRAM N/A 09/06/2013   Procedure: LEFT HEART CATHETERIZATION WITH CORONARY ANGIOGRAM;  Surgeon: Sinclair Grooms, MD;  Location: [facility];  Service: Cardiovascular;  Laterality: N/A;  . LEFT KNEE CAP  SURGERY ABOUT 40 YRS AGO  ? 1970    . STONE EXTRACTION WITH BASKET Left 03/19/2014   Procedure: STONE EXTRACTION WITH BASKET;  Surgeon: Junious Dresser, MD;  Location: [facility];  Service: Urology;  Laterality: Left;    Family Psychiatric History:  Pt denied  Family History:  Family History  Problem Relation Age of Onset  . Coronary artery disease Mother   . Heart attack Mother   . Heart disease Mother   . Hypertension Mother   . Cancer Father        lung and colon  . Lung cancer Father        smoker  . Diabetes Brother     Social History:   Social History   Social History  . Marital status: Married    Spouse name: N/A  . Number of children: N/A  . Years of education: N/A   Occupational History  . retired Print production planner    Social History Main Topics  . Smoking status: Never Smoker  . Smokeless tobacco: Never Used  . Alcohol use No     Comment: Occasional beer  . Drug use: No  . Sexual activity: Yes    Partners: Female    Birth control/ protection: None   Other Topics Concern  . 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  Worked for [employer].   Allergies:   Allergies  Allergen Reactions  . Codeine Nausea Only  . Tape Other (See Comments)    Use only paper tape. Severe blistering and bruising with other tapes. No cloth tape.    Metabolic Disorder Labs: Lab Results  Component Value Date   HGBA1C 5.1 08/11/2016   No results found for:  PROLACTIN Lab Results  Component Value Date   CHOL 145 07/19/2016   TRIG 60 07/19/2016   HDL 70 07/19/2016   CHOLHDL 2.1 07/19/2016   VLDL 8.6 02/02/2016   LDLCALC 63 07/19/2016   LDLCALC 71 02/02/2016     Current Medications: Current Outpatient Prescriptions  Medication Sig Dispense Refill  . aspirin EC 81 MG tablet Take 81 mg by mouth at bedtime.     [name] atorvastatin (LIPITOR) 40 MG tablet TAKE 1 TABLET BY MOUTH DAILY. 90 tablet 3  . clopidogrel (PLAVIX) 75 MG tablet TAKE 1 TABLET (75 MG TOTAL) BY MOUTH DAILY. 90 tablet 3  . [facility] IRON 325 (65 Fe) MG tablet TAKE 1 TABLET BY MOUTH 3 TIMES  DAILY WITH MEALS. 270 tablet 3  . cyclobenzaprine (FLEXERIL) 10 MG tablet     . fludrocortisone (FLORINEF) 0.1 MG tablet Take 1 tablet (0.1 mg total) by mouth daily. 30 tablet 11  . FLUOCINOLONE ACETONIDE BODY 0.01 % OIL Apply 1 application topically daily as needed (for dry skin).     [name] FLUoxetine (PROZAC) 40 MG capsule Take 1 capsule (40 mg total) by mouth every morning. 90 capsule 1  . Krill Oil 500 MG CAPS Take 500 mg by mouth daily.    . midodrine (PROAMATINE) 5 MG tablet TAKE 1 TABLET (5 MG TOTAL) BY MOUTH 2 (TWO) TIMES DAILY WITH BREAKFAST AND LUNCH. 180 tablet 3  . mirtazapine (REMERON) 15 MG tablet Take 1 tablet (15 mg total) by mouth at bedtime. 90 tablet 1  . Multiple Vitamins-Minerals (CENTRUM ADULTS PO) Take 1 tablet by mouth every morning.     [name] NITROSTAT 0.4 MG SL tablet PLACE 1 TABLET (0.4 MG TOTAL) UNDER THE TONGUE EVERY 5 (FIVE) MINUTES AS NEEDED FOR CHEST PAIN. 25 tablet 5  . vitamin B-12 (CYANOCOBALAMIN) 1000 MCG tablet Take 1,000 mcg by mouth at bedtime.     . furosemide (LASIX) 20 MG tablet Take 1 tablet (20 mg total) by mouth daily. 90 tablet 3   No current facility-administered medications for this visit.     Neurologic: Headache: No Seizure: No Paresthesias:No  Musculoskeletal: Strength & Muscle Tone: within normal limits Gait & Station: normal Patient leans:  N/A  Psychiatric Specialty Exam: Review of Systems  Musculoskeletal: Positive for back pain.  Psychiatric/Behavioral: Positive for depression. The patient is nervous/anxious.     Blood pressure 112/66, pulse 67, height 5' 9.25" (1.759 m), weight 165 lb (74.8 kg).Body mass index is 24.19 kg/m.  General Appearance: Casual  Eye Contact:  Fair  Speech:  Clear and Coherent  Volume:  Normal  Mood:  Anxious  Affect:  Congruent  Thought Process:  Goal Directed  Orientation:  Full (Time, Place, and Person)  Thought Content:  WDL  Suicidal Thoughts:  No  Homicidal Thoughts:  No  Memory:  Immediate;   Fair Recent;   Fair Remote;   Fair  Judgement:  Fair  Insight:  Fair  Psychomotor Activity:  Normal  Concentration:  Concentration: Fair and Attention Span: Fair  Recall:  [employer] of Knowledge:Fair  Language: Fair  Akathisia:  No  Handed:  Right  AIMS (if indicated):    Assets:  Communication Skills Desire for Improvement Physical Health Social Support  ADL's:  Intact  Cognition: WNL  Sleep:  poor    Treatment Plan Summary: Medication management   Discussed with patient at length about the medications treatment risks benefits and alternatives.  Continue  Remeron 15 mg by mouth daily at bedtime to help with his anxiety insomnia and depression. Continue  Prozac 60 mg at this time to help with his anxiety.    He will follow-up in 2 months or earlier depending on his symptoms.     More than 50% of the time spent in psychoeducation, counseling and coordination of care.    This note was generated in part or whole with voice recognition software. Voice regonition is usually quite accurate but there are transcription errors that can and very often do occur. I apologize for any typographical errors that were not detected and corrected.   Rainey Pines, MD 7/23/20181:14 PM

## 2016-11-02 ENCOUNTER — Encounter: Payer: Self-pay | Admitting: Family Medicine

## 2016-11-02 ENCOUNTER — Encounter: Payer: Self-pay | Admitting: *Deleted

## 2016-11-02 ENCOUNTER — Ambulatory Visit (INDEPENDENT_AMBULATORY_CARE_PROVIDER_SITE_OTHER): Payer: Medicare Other | Admitting: Family Medicine

## 2016-11-02 VITALS — BP 120/74 | HR 69 | Temp 98.1°F | Ht 72.0 in | Wt 163.2 lb

## 2016-11-02 DIAGNOSIS — R600 Localized edema: Secondary | ICD-10-CM

## 2016-11-02 DIAGNOSIS — G4733 Obstructive sleep apnea (adult) (pediatric): Secondary | ICD-10-CM | POA: Diagnosis not present

## 2016-11-02 DIAGNOSIS — Z01818 Encounter for other preprocedural examination: Secondary | ICD-10-CM | POA: Diagnosis not present

## 2016-11-02 DIAGNOSIS — E039 Hypothyroidism, unspecified: Secondary | ICD-10-CM | POA: Diagnosis not present

## 2016-11-02 DIAGNOSIS — F411 Generalized anxiety disorder: Secondary | ICD-10-CM

## 2016-11-02 DIAGNOSIS — D539 Nutritional anemia, unspecified: Secondary | ICD-10-CM | POA: Diagnosis not present

## 2016-11-02 DIAGNOSIS — Z01812 Encounter for preprocedural laboratory examination: Secondary | ICD-10-CM | POA: Diagnosis not present

## 2016-11-02 DIAGNOSIS — I2510 Atherosclerotic heart disease of native coronary artery without angina pectoris: Secondary | ICD-10-CM | POA: Diagnosis not present

## 2016-11-02 DIAGNOSIS — N028 Recurrent and persistent hematuria with other morphologic changes: Secondary | ICD-10-CM

## 2016-11-02 DIAGNOSIS — S22000K Wedge compression fracture of unspecified thoracic vertebra, subsequent encounter for fracture with nonunion: Secondary | ICD-10-CM

## 2016-11-02 DIAGNOSIS — M4854XA Collapsed vertebra, not elsewhere classified, thoracic region, initial encounter for fracture: Secondary | ICD-10-CM | POA: Diagnosis not present

## 2016-11-02 MED ORDER — FLUOXETINE HCL 20 MG PO CAPS: 20.0000 mg | ORAL_CAPSULE | Freq: Every day | ORAL | 1 refills | Status: DC

## 2016-11-02 MED ORDER — LEVOTHYROXINE SODIUM 25 MCG PO TABS: 25.0000 ug | ORAL_TABLET | Freq: Every day | ORAL | 11 refills | Status: DC

## 2016-11-02 MED ORDER — FLUOXETINE HCL 40 MG PO CAPS: 40.0000 mg | ORAL_CAPSULE | Freq: Every morning | ORAL | 1 refills | Status: DC

## 2016-11-02 MED ORDER — MIRTAZAPINE 15 MG PO TABS: 15.0000 mg | ORAL_TABLET | Freq: Every day | ORAL | 1 refills | Status: DC

## 2016-11-02 NOTE — Assessment & Plan Note (Signed)
Unclear cause, but now resolved.

## 2016-11-02 NOTE — Progress Notes (Signed)
   Subjective:    Patient ID: Shawn Lam, male    DOB: 03-28-1951, 66 y.o.   MRN: [id number]  HPI   66 year old male presents for medical clearance for upcoming back surgery Dr. Angus Palms in [facility]. Plans kyphoplasty. MRI showed T12 compression fracture May be osteoporotic.[name] Plans to eval.   He has cardiologist ( Dr. Radford Pax) and plans to get clearance from cardiac standpoint from them given his CAD, orthostatic hypotension  IgA nephropathy: stable per last  visit with Dr. Lorrene Reid.  Peripheral edema: Improved and back to baseline weight. Wt Readings from Last 3 Encounters:  11/02/16 163 lb 4 oz (74 kg)  10/06/16 171 lb 6 oz (77.7 kg)  08/22/16 163 lb 12.8 oz (74.3 kg)   Anemia: last cbc hg 12.0 on 06/03/2016   OSA: CPAP, Places him at higher risk for intubation issues but oropharynx is open and clear.  He is falling asleep very easily in last month.. Recent thyroid tests are abnormal.. Slightly.  Blood pressure 120/74, pulse 69, temperature 98.1 F (36.7 C), temperature source Oral, height 6' (1.829 m), weight 163 lb 4 oz (74 kg). Social History /Family History/Past Medical History reviewed in detail and updated in EMR if needed.   Review of Systems  Constitutional: Negative for fatigue and fever.  HENT: Negative for ear pain.   Eyes: Negative for pain.  Respiratory: Negative for cough and shortness of breath.   Cardiovascular: Negative for chest pain, palpitations and leg swelling.  Gastrointestinal: Negative for abdominal pain.  Genitourinary: Negative for dysuria.  Musculoskeletal: Negative for arthralgias.  Neurological: Negative for syncope, light-headedness and headaches.  Psychiatric/Behavioral: Negative for dysphoric mood.       Objective:   Physical Exam  Constitutional: Vital signs are normal. He appears well-developed and well-nourished.  HENT:  Head: Normocephalic.  Right Ear: Hearing normal.  Left Ear: Hearing normal.  Nose: Nose normal.  Mouth/Throat:  Oropharynx is clear and moist and mucous membranes are normal.  Neck: Trachea normal. Carotid bruit is not present. No thyroid mass and no thyromegaly present.  Cardiovascular: Normal rate, regular rhythm and normal pulses.  Exam reveals no gallop, no distant heart sounds and no friction rub.   No murmur heard. No peripheral edema  Pulmonary/Chest: Effort normal and breath sounds normal. No respiratory distress.  Musculoskeletal:  No focal back pain  Skin: Skin is warm, dry and intact. No rash noted.  Psychiatric: He has a normal mood and affect. His speech is normal and behavior is normal. Thought content normal.          Assessment & Plan:

## 2016-11-02 NOTE — Assessment & Plan Note (Signed)
Start low dose thyroid hormone as  He is borderline low but very symptomatic.

## 2016-11-02 NOTE — Patient Instructions (Signed)
Start low dose levothyroxine daily.  Return for lab only visit to check thyroid in 4 weeks.

## 2016-11-02 NOTE — Assessment & Plan Note (Addendum)
Needs bone density eval.  Upcoming kyphoplasty to repair.  Needs cardiac clearance. Otherwise cleared medically.. Moderate risk given cardiovascular issues and IGA nephropathy

## 2016-11-02 NOTE — Assessment & Plan Note (Signed)
Good control on current regimen. Pt wishes to stop seeing psychiatrist and have me take over prescriptions.

## 2016-11-02 NOTE — Assessment & Plan Note (Signed)
Stable creatinine

## 2016-11-02 NOTE — Assessment & Plan Note (Signed)
Stable on cpap

## 2016-11-03 ENCOUNTER — Telehealth: Payer: Self-pay | Admitting: Cardiology

## 2016-11-03 NOTE — Telephone Encounter (Signed)
New message        [facility] Pre-operative Risk Assessment    Request for surgical clearance:  1. What type of surgery is being performed?  Kyphoplasty  2. When is this surgery scheduled?  11/07/16   3. Are there any medications that need to be held prior to surgery and how long? no  4. Name of physician performing surgery?  Dr Marcello Moores Dimmig   5. What is your office phone and fax number? Fax [phone]  Office [phone]    Shawn Lam 11/03/2016, 10:12 AM  _________________________________________________________________   (provider comments below)

## 2016-11-03 NOTE — Telephone Encounter (Signed)
Attempted to call number given - person answered said it was the wrong number.  Clearance sent by Medical Records this AM.

## 2016-11-07 DIAGNOSIS — G4733 Obstructive sleep apnea (adult) (pediatric): Secondary | ICD-10-CM | POA: Diagnosis not present

## 2016-11-07 DIAGNOSIS — Z7902 Long term (current) use of antithrombotics/antiplatelets: Secondary | ICD-10-CM | POA: Diagnosis not present

## 2016-11-07 DIAGNOSIS — N028 Recurrent and persistent hematuria with other morphologic changes: Secondary | ICD-10-CM | POA: Diagnosis not present

## 2016-11-07 DIAGNOSIS — M4854XA Collapsed vertebra, not elsewhere classified, thoracic region, initial encounter for fracture: Secondary | ICD-10-CM | POA: Diagnosis not present

## 2016-11-07 DIAGNOSIS — M47816 Spondylosis without myelopathy or radiculopathy, lumbar region: Secondary | ICD-10-CM | POA: Diagnosis not present

## 2016-11-07 DIAGNOSIS — N183 Chronic kidney disease, stage 3 (moderate): Secondary | ICD-10-CM | POA: Diagnosis not present

## 2016-11-07 DIAGNOSIS — F329 Major depressive disorder, single episode, unspecified: Secondary | ICD-10-CM | POA: Diagnosis not present

## 2016-11-07 DIAGNOSIS — Z7982 Long term (current) use of aspirin: Secondary | ICD-10-CM | POA: Diagnosis not present

## 2016-11-07 DIAGNOSIS — Z955 Presence of coronary angioplasty implant and graft: Secondary | ICD-10-CM | POA: Diagnosis not present

## 2016-11-07 DIAGNOSIS — I2510 Atherosclerotic heart disease of native coronary artery without angina pectoris: Secondary | ICD-10-CM | POA: Diagnosis not present

## 2016-11-07 DIAGNOSIS — E039 Hypothyroidism, unspecified: Secondary | ICD-10-CM | POA: Diagnosis not present

## 2016-11-07 DIAGNOSIS — S22089A Unspecified fracture of T11-T12 vertebra, initial encounter for closed fracture: Secondary | ICD-10-CM | POA: Diagnosis not present

## 2016-11-09 ENCOUNTER — Telehealth: Payer: Self-pay | Admitting: Cardiology

## 2016-11-09 ENCOUNTER — Ambulatory Visit (INDEPENDENT_AMBULATORY_CARE_PROVIDER_SITE_OTHER): Payer: Medicare Other | Admitting: *Deleted

## 2016-11-09 DIAGNOSIS — R002 Palpitations: Secondary | ICD-10-CM | POA: Diagnosis not present

## 2016-11-09 DIAGNOSIS — G4733 Obstructive sleep apnea (adult) (pediatric): Secondary | ICD-10-CM

## 2016-11-09 NOTE — Telephone Encounter (Signed)
Patient calling, needs a prescription for a "mask with nose piece" for his CPAP machine.  patient states that he is using a full mask but "its not working." Fax # is [phone]

## 2016-11-09 NOTE — Telephone Encounter (Signed)
Patient made aware that information has been forwarded.

## 2016-11-10 NOTE — Progress Notes (Signed)
[facility] Summary Report / Loop Recorder 

## 2016-11-14 NOTE — Telephone Encounter (Signed)
Prescription faxed to [facility] re-supply team [phone]) for a new nasal mask. Patient notified

## 2016-11-17 ENCOUNTER — Other Ambulatory Visit: Payer: Self-pay | Admitting: Family Medicine

## 2016-11-17 DIAGNOSIS — E039 Hypothyroidism, unspecified: Secondary | ICD-10-CM

## 2016-11-18 DIAGNOSIS — M4854XD Collapsed vertebra, not elsewhere classified, thoracic region, subsequent encounter for fracture with routine healing: Secondary | ICD-10-CM | POA: Diagnosis not present

## 2016-11-21 DIAGNOSIS — M545 Low back pain: Secondary | ICD-10-CM | POA: Diagnosis not present

## 2016-11-21 DIAGNOSIS — M546 Pain in thoracic spine: Secondary | ICD-10-CM | POA: Diagnosis not present

## 2016-11-21 LAB — CUP PACEART REMOTE DEVICE CHECK
Implantable Pulse Generator Implant Date: 20180202
MDC IDC SESS DTM: 20180801174055

## 2016-11-23 DIAGNOSIS — M546 Pain in thoracic spine: Secondary | ICD-10-CM | POA: Diagnosis not present

## 2016-11-23 DIAGNOSIS — M545 Low back pain: Secondary | ICD-10-CM | POA: Diagnosis not present

## 2016-11-29 DIAGNOSIS — Z961 Presence of intraocular lens: Secondary | ICD-10-CM | POA: Diagnosis not present

## 2016-11-29 DIAGNOSIS — H16223 Keratoconjunctivitis sicca, not specified as Sjogren's, bilateral: Secondary | ICD-10-CM | POA: Diagnosis not present

## 2016-11-29 DIAGNOSIS — H18413 Arcus senilis, bilateral: Secondary | ICD-10-CM | POA: Diagnosis not present

## 2016-11-29 DIAGNOSIS — H40013 Open angle with borderline findings, low risk, bilateral: Secondary | ICD-10-CM | POA: Diagnosis not present

## 2016-11-30 ENCOUNTER — Other Ambulatory Visit (INDEPENDENT_AMBULATORY_CARE_PROVIDER_SITE_OTHER): Payer: Medicare Other

## 2016-11-30 DIAGNOSIS — M25562 Pain in left knee: Secondary | ICD-10-CM | POA: Diagnosis not present

## 2016-11-30 DIAGNOSIS — E039 Hypothyroidism, unspecified: Secondary | ICD-10-CM | POA: Diagnosis not present

## 2016-11-30 DIAGNOSIS — M546 Pain in thoracic spine: Secondary | ICD-10-CM | POA: Diagnosis not present

## 2016-11-30 LAB — TSH: TSH: 5.79 u[IU]/mL — AB (ref 0.35–4.50)

## 2016-11-30 LAB — T4, FREE: FREE T4: 0.71 ng/dL (ref 0.60–1.60)

## 2016-12-05 DIAGNOSIS — M546 Pain in thoracic spine: Secondary | ICD-10-CM | POA: Diagnosis not present

## 2016-12-05 DIAGNOSIS — M545 Low back pain: Secondary | ICD-10-CM | POA: Diagnosis not present

## 2016-12-07 DIAGNOSIS — M546 Pain in thoracic spine: Secondary | ICD-10-CM | POA: Diagnosis not present

## 2016-12-07 DIAGNOSIS — M545 Low back pain: Secondary | ICD-10-CM | POA: Diagnosis not present

## 2016-12-09 ENCOUNTER — Ambulatory Visit (INDEPENDENT_AMBULATORY_CARE_PROVIDER_SITE_OTHER): Payer: Medicare Other | Admitting: *Deleted

## 2016-12-09 DIAGNOSIS — R002 Palpitations: Secondary | ICD-10-CM

## 2016-12-09 LAB — CUP PACEART REMOTE DEVICE CHECK
Date Time Interrogation Session: 20180831180927
Implantable Pulse Generator Implant Date: 20180202

## 2016-12-09 NOTE — Progress Notes (Signed)
Carelink summary report received. Battery status OK. Normal device function. No new symptom episodes, tachy episodes, brady, or pause episodes. No new AF episodes. Monthly summary reports and ROV/PRN 

## 2016-12-09 NOTE — Progress Notes (Signed)
[facility] Summary Report / Loop Recorder 

## 2016-12-13 ENCOUNTER — Encounter: Payer: Self-pay | Admitting: Family Medicine

## 2016-12-13 DIAGNOSIS — M810 Age-related osteoporosis without current pathological fracture: Secondary | ICD-10-CM | POA: Diagnosis not present

## 2016-12-14 DIAGNOSIS — M546 Pain in thoracic spine: Secondary | ICD-10-CM | POA: Diagnosis not present

## 2016-12-14 DIAGNOSIS — M545 Low back pain: Secondary | ICD-10-CM | POA: Diagnosis not present

## 2016-12-16 DIAGNOSIS — M545 Low back pain: Secondary | ICD-10-CM | POA: Diagnosis not present

## 2016-12-16 DIAGNOSIS — M4854XS Collapsed vertebra, not elsewhere classified, thoracic region, sequela of fracture: Secondary | ICD-10-CM | POA: Diagnosis not present

## 2016-12-22 DIAGNOSIS — M818 Other osteoporosis without current pathological fracture: Secondary | ICD-10-CM | POA: Diagnosis not present

## 2016-12-28 DIAGNOSIS — M545 Low back pain: Secondary | ICD-10-CM | POA: Diagnosis not present

## 2017-01-02 ENCOUNTER — Encounter: Payer: Self-pay | Admitting: Psychiatry

## 2017-01-02 ENCOUNTER — Ambulatory Visit (INDEPENDENT_AMBULATORY_CARE_PROVIDER_SITE_OTHER): Payer: Medicare Other | Admitting: Psychiatry

## 2017-01-02 VITALS — BP 136/76 | HR 66 | Temp 98.7°F | Wt 175.6 lb

## 2017-01-02 DIAGNOSIS — I2510 Atherosclerotic heart disease of native coronary artery without angina pectoris: Secondary | ICD-10-CM

## 2017-01-02 DIAGNOSIS — F411 Generalized anxiety disorder: Secondary | ICD-10-CM

## 2017-01-02 DIAGNOSIS — F39 Unspecified mood [affective] disorder: Secondary | ICD-10-CM

## 2017-01-02 MED ORDER — FLUOXETINE HCL 20 MG PO CAPS: 20.0000 mg | ORAL_CAPSULE | Freq: Every day | ORAL | 1 refills | Status: DC

## 2017-01-02 MED ORDER — MIRTAZAPINE 15 MG PO TABS: 15.0000 mg | ORAL_TABLET | Freq: Every day | ORAL | 1 refills | Status: DC

## 2017-01-02 MED ORDER — FLUOXETINE HCL 40 MG PO CAPS: 40.0000 mg | ORAL_CAPSULE | Freq: Every morning | ORAL | 1 refills | Status: DC

## 2017-01-02 NOTE — Progress Notes (Signed)
Psychiatric MD Progress Note   Patient Identification: Shawn Lam MRN:  [id number] Date of Evaluation:  01/02/2017 Referral Source: Stanton Kidney- Therapist Chief Complaint:   Chief Complaint    Follow-up; Medication Refill     Visit Diagnosis:    ICD-10-CM   1. Episodic mood disorder ([place]) F39   2. Generalized anxiety disorder F41.1     History of Present Illness:    Patient is a 66 year old married male who presented for follow-up. He reported that he Continues to have back problems and has been getting injections in his back. He was found to have a fracture in his back recently and has been going to get injections at 6 week intervals. Patient reported he wakes up early in the morning and will exercise as usual. He reported that his energy level is good. He denied having any side effects of the medication. He appeared calm and alert during the interview. He denied having any suicidal homicidal ideations or plans. He denied having any perceptual disturbances. He stated that the current combination of medication has been helping him. He is concerned about his back issues and wants to have it is stronger         Associated Signs/Symptoms: Depression Symptoms:  fatigue, anxiety, decreased appetite, (Hypo) Manic Symptoms:  Labiality of Mood, Anxiety Symptoms:  Excessive Worry, Psychotic Symptoms:  none PTSD Symptoms: Negative NA  Past Psychiatric History:  Pt reported history of anxiety and has been taking medications including Zoloft and Xanax prescribed by his primary care physician. He denied any previous history of psychiatric hospitalization. No history of suicide attempt.  Previous Psychotropic Medications: Xanax 0.5 mg by mouth twice a day Zoloft 100 mg by mouth twice a day   Substance Abuse History in the last 12 months:  Yes.   3 beers/week  Consequences of Substance Abuse: Negative NA  Past Medical History:  Past Medical History:  Diagnosis Date  . Anemia    . Anxiety   . Asymptomatic LV dysfunction    mild with EF 42% by recent nuclear stress test.  . Berger's disease   . CAD (coronary artery disease) 06/2005   PCI of LAD and RCA  . Depression   . Hematuria   . Hyperlipidemia   . Insomnia   . Left ureteral stone   . Orthostatic hypotension    negative tilt table  . OSA (obstructive sleep apnea)    moderate with AHI 17/hr  . PVC's (premature ventricular contractions) 07/07/2016  . Renal disorder    PT IS SEEING DR. Lorrene Reid- NEPHROLOGIST  . Rosacea   . Urticaria     Past Surgical History:  Procedure Laterality Date  . CARDIAC CATHETERIZATION  09/06/2013   [place]  . CARDIAC CATHETERIZATION    . CORONARY ANGIOPLASTY  2004   STENT PLACEMENT  . CYSTOSCOPY WITH RETROGRADE PYELOGRAM, URETEROSCOPY AND STENT PLACEMENT Left 03/19/2014   Procedure: CYSTOSCOPY WITH RETROGRADE PYELOGRAM, URETEROSCOPY AND STENT PLACEMENT;  Surgeon: Junious Dresser, MD;  Location: [facility];  Service: Urology;  Laterality: Left;  . CYSTOSCOPY WITH RETROGRADE PYELOGRAM, URETEROSCOPY AND STENT PLACEMENT Bilateral 05/20/2015   Procedure:  CYSTOSCOPY WITH BILATERAL RETROGRADE PYELOGRAM,RIGHT  DIAGNOSTIC URETEROSCOPY ,LEFT URETEROSCOPY WITH HOLMIUM LASER  AND BILATERAL  STENT PLACEMENT ;  Surgeon: Alexis Frock, MD;  Location: [facility];  Service: Urology;  Laterality: Bilateral;  . EP IMPLANTABLE DEVICE N/A 05/13/2016   Procedure: Loop Recorder Insertion;  Surgeon: Deboraha Sprang, MD;  Location: [place] [facility];  Service:  Cardiovascular;  Laterality: N/A;  . HOLMIUM LASER APPLICATION Bilateral 07/14/8183   Procedure: HOLMIUM LASER APPLICATION;  Surgeon: Alexis Frock, MD;  Location: [facility];  Service: Urology;  Laterality: Bilateral;  . LEFT HEART CATHETERIZATION WITH CORONARY ANGIOGRAM N/A 09/06/2013   Procedure: LEFT HEART CATHETERIZATION WITH CORONARY ANGIOGRAM;  Surgeon: Sinclair Grooms, MD;  Location: [facility];  Service: Cardiovascular;  Laterality: N/A;  . LEFT KNEE  CAP  SURGERY ABOUT 40 YRS AGO  ? 1970    . STONE EXTRACTION WITH BASKET Left 03/19/2014   Procedure: STONE EXTRACTION WITH BASKET;  Surgeon: Junious Dresser, MD;  Location: [facility];  Service: Urology;  Laterality: Left;    Family Psychiatric History:  Pt denied  Family History:  Family History  Problem Relation Age of Onset  . Coronary artery disease Mother   . Heart attack Mother   . Heart disease Mother   . Hypertension Mother   . Cancer Father        lung and colon  . Lung cancer Father        smoker  . Diabetes Brother     Social History:   Social History   Social History  . Marital status: Married    Spouse name: N/A  . Number of children: N/A  . Years of education: N/A   Occupational History  . retired Print production planner    Social History Main Topics  . Smoking status: Never Smoker  . Smokeless tobacco: Never Used  . Alcohol use No     Comment: Occasional beer  . Drug use: No  . Sexual activity: Yes    Partners: Female    Birth control/ protection: None   Other Topics Concern  . 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  Worked for [employer].   Allergies:   Allergies  Allergen Reactions  . Codeine Nausea Only  . Tape Other (See Comments)    Use only paper tape. Severe blistering and bruising with other tapes. No cloth tape.    Metabolic Disorder Labs: Lab Results  Component Value Date   HGBA1C 5.1 08/11/2016   No results found for: PROLACTIN Lab Results  Component Value Date   CHOL 145 07/19/2016   TRIG 60 07/19/2016   HDL 70 07/19/2016   CHOLHDL 2.1 07/19/2016   VLDL 8.6 02/02/2016   LDLCALC 63 07/19/2016   LDLCALC 71 02/02/2016     Current Medications: Current Outpatient Prescriptions  Medication Sig Dispense Refill  . aspirin EC 81 MG tablet  Take 81 mg by mouth at bedtime.     [name] atorvastatin (LIPITOR) 40 MG tablet TAKE 1 TABLET BY MOUTH DAILY. 90 tablet 3  . clopidogrel (PLAVIX) 75 MG tablet TAKE 1 TABLET (75 MG TOTAL) BY MOUTH DAILY. 90 tablet 3  . [facility] IRON 325 (65 Fe) MG tablet TAKE 1 TABLET BY MOUTH 3 TIMES DAILY WITH MEALS. 270 tablet 3  . fludrocortisone (FLORINEF) 0.1 MG tablet Take 1 tablet (0.1 mg total) by mouth daily. 30 tablet 11  . FLUOCINOLONE ACETONIDE BODY 0.01 % OIL Apply 1 application topically daily as needed (for dry skin).     [name] FLUoxetine (PROZAC) 20 MG capsule Take 1 capsule (20 mg total) by mouth daily.  90 capsule 1  . FLUoxetine (PROZAC) 40 MG capsule Take 1 capsule (40 mg total) by mouth every morning. 90 capsule 1  . HYDROcodone-acetaminophen (NORCO/VICODIN) 5-325 MG tablet Take 1 tablet by mouth every 6 (six) hours.  0  . Krill Oil 500 MG CAPS Take 500 mg by mouth daily.    [name] levothyroxine (SYNTHROID, LEVOTHROID) 25 MCG tablet Take 1 tablet (25 mcg total) by mouth daily before breakfast. 30 tablet 11  . meloxicam (MOBIC) 15 MG tablet TAKE 1 TABLET(S) EVERY DAY BY ORAL ROUTE.  2  . midodrine (PROAMATINE) 5 MG tablet TAKE 1 TABLET (5 MG TOTAL) BY MOUTH 2 (TWO) TIMES DAILY WITH BREAKFAST AND LUNCH. 180 tablet 3  . mirtazapine (REMERON) 15 MG tablet Take 1 tablet (15 mg total) by mouth at bedtime. 90 tablet 1  . Multiple Vitamins-Minerals (CENTRUM ADULTS PO) Take 1 tablet by mouth every morning.     [name] NITROSTAT 0.4 MG SL tablet PLACE 1 TABLET (0.4 MG TOTAL) UNDER THE TONGUE EVERY 5 (FIVE) MINUTES AS NEEDED FOR CHEST PAIN. 25 tablet 5  . tiZANidine (ZANAFLEX) 4 MG tablet Take 4 mg by mouth every 8 (eight) hours.  2  . vitamin B-12 (CYANOCOBALAMIN) 1000 MCG tablet Take 1,000 mcg by mouth at bedtime.     . furosemide (LASIX) 20 MG tablet Take 1 tablet (20 mg total) by mouth daily. 90 tablet 3   No current facility-administered medications for this visit.     Neurologic: Headache: No Seizure:  No Paresthesias:No  Musculoskeletal: Strength & Muscle Tone: within normal limits Gait & Station: normal Patient leans: N/A  Psychiatric Specialty Exam: Review of Systems  Musculoskeletal: Positive for back pain.  Psychiatric/Behavioral: Positive for depression. The patient is nervous/anxious.     Blood pressure 136/76, pulse 66, temperature 98.7 F (37.1 C), temperature source Oral, weight 175 lb 9.6 oz (79.7 kg).Body mass index is 23.82 kg/m.  General Appearance: Casual  Eye Contact:  Fair  Speech:  Clear and Coherent  Volume:  Normal  Mood:  Anxious  Affect:  Congruent  Thought Process:  Goal Directed  Orientation:  Full (Time, Place, and Person)  Thought Content:  WDL  Suicidal Thoughts:  No  Homicidal Thoughts:  No  Memory:  Immediate;   Fair Recent;   Fair Remote;   Fair  Judgement:  Fair  Insight:  Fair  Psychomotor Activity:  Normal  Concentration:  Concentration: Fair and Attention Span: Fair  Recall:  [employer] of Knowledge:Fair  Language: Fair  Akathisia:  No  Handed:  Right  AIMS (if indicated):    Assets:  Communication Skills Desire for Improvement Physical Health Social Support  ADL's:  Intact  Cognition: WNL  Sleep:  poor    Treatment Plan Summary: Medication management   Discussed with patient at length about the medications treatment risks benefits and alternatives.  Continue  Remeron 15 mg by mouth daily at bedtime to help with his anxiety insomnia and depression. Continue  Prozac 60 mg at this time to help with his anxiety.    He will follow-up in 2 months or earlier depending on his symptoms.     More than 50% of the time spent in psychoeducation, counseling and coordination of care.    This note was generated in part or whole with voice recognition software. Voice regonition is usually quite accurate but there are transcription errors that can and very often do occur. I apologize for any typographical errors that were not  detected and corrected.   Rainey Pines, MD 9/24/20182:28 PM

## 2017-01-09 ENCOUNTER — Ambulatory Visit (INDEPENDENT_AMBULATORY_CARE_PROVIDER_SITE_OTHER): Payer: Medicare Other | Admitting: *Deleted

## 2017-01-09 DIAGNOSIS — R002 Palpitations: Secondary | ICD-10-CM

## 2017-01-10 NOTE — Progress Notes (Signed)
Loop recorder summary report 

## 2017-01-11 LAB — CUP PACEART REMOTE DEVICE CHECK
Date Time Interrogation Session: 20180930214221
Implantable Pulse Generator Implant Date: 20180202

## 2017-01-20 ENCOUNTER — Ambulatory Visit (INDEPENDENT_AMBULATORY_CARE_PROVIDER_SITE_OTHER): Payer: Medicare Other

## 2017-01-20 DIAGNOSIS — Z23 Encounter for immunization: Secondary | ICD-10-CM | POA: Diagnosis not present

## 2017-02-06 ENCOUNTER — Telehealth: Payer: Self-pay | Admitting: Family Medicine

## 2017-02-06 ENCOUNTER — Other Ambulatory Visit (INDEPENDENT_AMBULATORY_CARE_PROVIDER_SITE_OTHER): Payer: Medicare Other

## 2017-02-06 DIAGNOSIS — Z125 Encounter for screening for malignant neoplasm of prostate: Secondary | ICD-10-CM

## 2017-02-06 DIAGNOSIS — E039 Hypothyroidism, unspecified: Secondary | ICD-10-CM

## 2017-02-06 DIAGNOSIS — E7800 Pure hypercholesterolemia, unspecified: Secondary | ICD-10-CM

## 2017-02-06 DIAGNOSIS — L821 Other seborrheic keratosis: Secondary | ICD-10-CM | POA: Diagnosis not present

## 2017-02-06 DIAGNOSIS — D229 Melanocytic nevi, unspecified: Secondary | ICD-10-CM | POA: Diagnosis not present

## 2017-02-06 DIAGNOSIS — L570 Actinic keratosis: Secondary | ICD-10-CM | POA: Diagnosis not present

## 2017-02-06 DIAGNOSIS — D539 Nutritional anemia, unspecified: Secondary | ICD-10-CM

## 2017-02-06 DIAGNOSIS — Z1283 Encounter for screening for malignant neoplasm of skin: Secondary | ICD-10-CM | POA: Diagnosis not present

## 2017-02-06 DIAGNOSIS — L578 Other skin changes due to chronic exposure to nonionizing radiation: Secondary | ICD-10-CM | POA: Diagnosis not present

## 2017-02-06 DIAGNOSIS — L820 Inflamed seborrheic keratosis: Secondary | ICD-10-CM | POA: Diagnosis not present

## 2017-02-06 DIAGNOSIS — L812 Freckles: Secondary | ICD-10-CM | POA: Diagnosis not present

## 2017-02-06 DIAGNOSIS — Z85828 Personal history of other malignant neoplasm of skin: Secondary | ICD-10-CM | POA: Diagnosis not present

## 2017-02-06 LAB — LIPID PANEL
CHOLESTEROL: 155 mg/dL (ref 0–200)
HDL: 74.6 mg/dL (ref >39.00)
LDL Cholesterol: 72 mg/dL (ref 0–99)
NONHDL: 80.7
Total CHOL/HDL Ratio: 2
Triglycerides: 46 mg/dL (ref 0.0–149.0)
VLDL: 9.2 mg/dL (ref 0.0–40.0)

## 2017-02-06 LAB — COMPREHENSIVE METABOLIC PANEL
ALK PHOS: 82 U/L (ref 39–117)
ALT: 15 U/L (ref 0–53)
AST: 28 U/L (ref 0–37)
Albumin: 3.8 g/dL (ref 3.5–5.2)
BILIRUBIN TOTAL: 0.6 mg/dL (ref 0.2–1.2)
BUN: 15 mg/dL (ref 6–23)
CALCIUM: 9.1 mg/dL (ref 8.4–10.5)
CO2: 28 mEq/L (ref 19–32)
Chloride: 106 mEq/L (ref 96–112)
Creatinine, Ser: 1.26 mg/dL (ref 0.40–1.50)
GFR: 60.74 mL/min (ref >60.00)
Glucose, Bld: 96 mg/dL (ref 70–99)
POTASSIUM: 4.4 meq/L (ref 3.5–5.1)
Sodium: 144 mEq/L (ref 135–145)
TOTAL PROTEIN: 6 g/dL (ref 6.0–8.3)

## 2017-02-06 LAB — PSA, MEDICARE: PSA: 0.74 ng/mL (ref 0.10–4.00)

## 2017-02-06 NOTE — Telephone Encounter (Signed)
-----   Message from Inocencio Homes, [place] sent at 01/26/2017  9:13 AM EDT ----- Regarding: CPE LABS  Patient has an appointment on 02/06/17, please order labs. Thank you!

## 2017-02-07 ENCOUNTER — Ambulatory Visit (INDEPENDENT_AMBULATORY_CARE_PROVIDER_SITE_OTHER): Payer: Medicare Other | Admitting: *Deleted

## 2017-02-07 DIAGNOSIS — R002 Palpitations: Secondary | ICD-10-CM

## 2017-02-08 DIAGNOSIS — M545 Low back pain: Secondary | ICD-10-CM | POA: Diagnosis not present

## 2017-02-09 NOTE — Progress Notes (Signed)
[facility] Summary Report / Loop Recorder 

## 2017-02-10 ENCOUNTER — Encounter: Payer: Self-pay | Admitting: Family Medicine

## 2017-02-10 ENCOUNTER — Ambulatory Visit (INDEPENDENT_AMBULATORY_CARE_PROVIDER_SITE_OTHER): Payer: Medicare Other | Admitting: Family Medicine

## 2017-02-10 VITALS — BP 120/70 | HR 77 | Temp 97.7°F | Ht 69.0 in | Wt 175.5 lb

## 2017-02-10 DIAGNOSIS — E7800 Pure hypercholesterolemia, unspecified: Secondary | ICD-10-CM

## 2017-02-10 DIAGNOSIS — Z0000 Encounter for general adult medical examination without abnormal findings: Secondary | ICD-10-CM | POA: Diagnosis not present

## 2017-02-10 DIAGNOSIS — F320 Major depressive disorder, single episode, mild: Secondary | ICD-10-CM

## 2017-02-10 DIAGNOSIS — M810 Age-related osteoporosis without current pathological fracture: Secondary | ICD-10-CM | POA: Insufficient documentation

## 2017-02-10 DIAGNOSIS — F32A Depression, unspecified: Secondary | ICD-10-CM

## 2017-02-10 DIAGNOSIS — S22000K Wedge compression fracture of unspecified thoracic vertebra, subsequent encounter for fracture with nonunion: Secondary | ICD-10-CM

## 2017-02-10 DIAGNOSIS — N028 Recurrent and persistent hematuria with other morphologic changes: Secondary | ICD-10-CM | POA: Diagnosis not present

## 2017-02-10 DIAGNOSIS — K921 Melena: Secondary | ICD-10-CM

## 2017-02-10 DIAGNOSIS — I2510 Atherosclerotic heart disease of native coronary artery without angina pectoris: Secondary | ICD-10-CM | POA: Diagnosis not present

## 2017-02-10 DIAGNOSIS — E039 Hypothyroidism, unspecified: Secondary | ICD-10-CM

## 2017-02-10 DIAGNOSIS — M8080XS Other osteoporosis with current pathological fracture, unspecified site, sequela: Secondary | ICD-10-CM | POA: Diagnosis not present

## 2017-02-10 DIAGNOSIS — F411 Generalized anxiety disorder: Secondary | ICD-10-CM | POA: Diagnosis not present

## 2017-02-10 LAB — CUP PACEART REMOTE DEVICE CHECK
Date Time Interrogation Session: 20181030221110
Implantable Pulse Generator Implant Date: 20180202

## 2017-02-10 MED ORDER — FLUOXETINE HCL 40 MG PO CAPS: 40.0000 mg | ORAL_CAPSULE | Freq: Every morning | ORAL | 1 refills | Status: DC

## 2017-02-10 MED ORDER — FLUOXETINE HCL 20 MG PO CAPS: 20.0000 mg | ORAL_CAPSULE | Freq: Every day | ORAL | 1 refills | Status: DC

## 2017-02-10 MED ORDER — MIRTAZAPINE 15 MG PO TABS: 15.0000 mg | ORAL_TABLET | Freq: Every day | ORAL | 1 refills | Status: DC

## 2017-02-10 NOTE — Assessment & Plan Note (Signed)
Neg hemeoccult in office.. Pt will complete stool cards at home. Continue plavix for now.

## 2017-02-10 NOTE — Assessment & Plan Note (Signed)
Followed by renal. °

## 2017-02-10 NOTE — Patient Instructions (Signed)
Return stool cards as soon as able.

## 2017-02-10 NOTE — Assessment & Plan Note (Signed)
Good control on current meds. 

## 2017-02-10 NOTE — Assessment & Plan Note (Signed)
Now on forteo.. Dr. Baxter Kail. ENDO

## 2017-02-10 NOTE — Assessment & Plan Note (Signed)
Stable control on current regimen. 

## 2017-02-10 NOTE — Addendum Note (Signed)
Addended by: Carter Kitten on: 02/10/2017 11:12 AM   Modules accepted: Orders

## 2017-02-10 NOTE — Assessment & Plan Note (Signed)
Stable control. 

## 2017-02-10 NOTE — Progress Notes (Addendum)
Subjective:    Patient ID: Shawn Lam, male    DOB: [birth year], 66 y.o.   MRN: [id number]  HPI  The patient presents for initial medicare wellness, complete physical and review of chronic health problems. He/She also has the following acute concerns today: loose stools , dark  black ( on iron) no clear blood in last few months . BMS 2-3 times a day, no expolsive at times.  On plavix  Hx of GERD.. Not on meds.  I have personally reviewed the Medicare Annual Wellness questionnaire and have noted 1. The patient's medical and social history 2. Their use of alcohol, tobacco or illicit drugs 3. Their current medications and supplements 4. The patient's functional ability including ADL's, fall risks, home safety risks and hearing or visual             impairment. 5. Diet and physical activities 6. Evidence for depression or mood disorders 7.         Updated provider list Cognitive evaluation was performed and recorded on pt medicare questionnaire form. The patients weight, height, BMI and visual acuity have been recorded in the chart  I have made referrals, counseling and provided education to the patient based review of the above and I have provided the pt with a written personalized care plan for preventive services.   Documentation of this information was scanned into the electronic record under the media tab.    GAD, mild depression  Good control  On Remeron 15 mg and high dose prozac.[name] No longer requiring alprazolam.  Elevated Cholesterol:  Good control on  Atorvastatin 40 mg Lab Results  Component Value Date   CHOL 155 02/06/2017   HDL 74.60 02/06/2017   LDLCALC 72 02/06/2017   LDLDIRECT 57 06/22/2009   TRIG 46.0 02/06/2017   CHOLHDL 2 02/06/2017  Using medications without problems: none Muscle aches:  none Diet compliance: good Exercise: gotten back to running Other complaints:  Prediabetes:  Lab Results  Component Value Date   HGBA1C 5.1 08/11/2016    CAD,  PVCs:  Followed by cardiology  no chest pain., no SOB, no edema.   IgA nephropathy: Followed by renal. Stable, slightly improved   Hypothyroid: stable at recent check.  Social History /Family History/Past Medical History reviewed in detail and updated in EMR if needed. Blood pressure 120/70, pulse 77, temperature 97.7 F (36.5 C), temperature source Oral, height 5\' 9"  (1.753 m), weight 175 lb 8 oz (79.6 kg).  Advance directives and end of life planning reviewed in detail with patient and documented in EMR. Patient given handout on advance care directives if needed. HCPOA and living will updated if needed. Fall Risk  02/10/2017 02/09/2016 01/31/2013  Falls in the past year? No No Yes  Number falls in past yr: - - 1  Injury with Fall? - - No    Hearing Screening   Method: Audiometry   125Hz  250Hz  500Hz  1000Hz  2000Hz  3000Hz  4000Hz  6000Hz  8000Hz   Right ear:   40 40 40  0    Left ear:   0 40 40  0    Vision Screening Comments: Wears Glasses-Eye Exam with Dr. Tommy Rainwater 11/2016  Depression screen [facility] 2/9 02/10/2017  Decreased Interest 0  Down, Depressed, Hopeless 0  PHQ - 2 Score 0  Altered sleeping -  Tired, decreased energy -  Change in appetite -  Feeling bad or failure about yourself  -  Trouble concentrating -  Moving slowly or fidgety/restless -  Suicidal thoughts -  PHQ-9 Score -  Difficult doing work/chores -  Some encounter information is confidential and restricted. Go to Review Flowsheets activity to see all data.  Some recent data might be hidden     Review of Systems  Constitutional: Negative for fatigue and fever.  HENT: Negative for ear pain.   Eyes: Negative for pain.  Respiratory: Negative for cough and shortness of breath.   Cardiovascular: Negative for chest pain, palpitations and leg swelling.  Gastrointestinal: Negative for abdominal pain.  Genitourinary: Negative for dysuria.  Musculoskeletal: Negative for arthralgias.  Neurological: Negative for syncope,  light-headedness and headaches.  Psychiatric/Behavioral: Negative for dysphoric mood.       Objective:   Physical Exam  Constitutional: Vital signs are normal. He appears well-developed and well-nourished.  Non-toxic appearance. He does not appear ill. No distress.  HENT:  Head: Normocephalic and atraumatic.  Right Ear: Hearing, tympanic membrane, external ear and ear canal normal.  Left Ear: Hearing, tympanic membrane, external ear and ear canal normal.  Nose: Nose normal.  Mouth/Throat: Uvula is midline, oropharynx is clear and moist and mucous membranes are normal.  Eyes: Pupils are equal, round, and reactive to light. Conjunctivae, EOM and lids are normal. Lids are everted and swept, no foreign bodies found.  Neck: Trachea normal, normal range of motion and phonation normal. Neck supple. Carotid bruit is not present. No thyroid mass and no thyromegaly present.  Cardiovascular: Normal rate, regular rhythm, S1 normal, S2 normal, intact distal pulses and normal pulses.  Exam reveals no gallop, no distant heart sounds and no friction rub.   No murmur heard. No peripheral edema  Pulmonary/Chest: Effort normal and breath sounds normal. No respiratory distress. He has no wheezes. He has no rhonchi. He has no rales.  Abdominal: Soft. Normal appearance and bowel sounds are normal. There is no hepatosplenomegaly. There is no tenderness. There is no rebound, no guarding and no CVA tenderness. No hernia.  Lymphadenopathy:    He has no cervical adenopathy.  Neurological: He is alert. He has normal strength and normal reflexes. No cranial nerve deficit or sensory deficit. Gait normal.  Skin: Skin is warm, dry and intact. No rash noted.  Psychiatric: He has a normal mood and affect. His speech is normal and behavior is normal. Judgment and thought content normal.   Neg hemeoccult in office.      Assessment & Plan:  The patient's preventative maintenance and recommended screening tests for an  annual wellness exam were reviewed in full today. Brought up to date unless services declined.  Counselled on the importance of diet, exercise, and its role in overall health and mortality. The patient's FH and SH was reviewed, including their home life, tobacco status, and drug and alcohol status.   Vaccines: Uptodate, flu given  01/20/2017.. Will return for PCV 23.  Nonsmoker   Lab Results  Component Value Date   PSA 0.74 02/06/2017   PSA 0.83 02/02/2016   PSA 0.61 02/03/2015   Colon cancer screening: Colonoscopy 08/2012 nml, repeat in 5 years. Hep C: done with eval for increase LFTs.  HIV: neg test 2 years ago.

## 2017-02-10 NOTE — Assessment & Plan Note (Signed)
Well controlled. Continue current medication.  

## 2017-02-10 NOTE — Assessment & Plan Note (Signed)
Followed by ortho. Recent khyphoplasty.

## 2017-02-13 ENCOUNTER — Other Ambulatory Visit: Payer: Medicare Other

## 2017-02-13 DIAGNOSIS — K921 Melena: Secondary | ICD-10-CM

## 2017-02-13 LAB — HEMOCCULT SLIDES (X 3 CARDS)
Fecal Occult Blood: NEGATIVE
OCCULT 1: NEGATIVE
OCCULT 2: NEGATIVE
OCCULT 3: NEGATIVE
OCCULT 4: NEGATIVE
OCCULT 5: NEGATIVE

## 2017-02-14 NOTE — Progress Notes (Deleted)
Shawn Lam,  I am sorry.. At our office we give them a form with all that info and the provider list and MMSE is in that paper work. That paperwork is scanned into the chart.   If forgot to put my quick tet in for that.. I have now added it.

## 2017-02-20 ENCOUNTER — Encounter: Payer: Self-pay | Admitting: Cardiology

## 2017-02-22 ENCOUNTER — Encounter: Payer: Self-pay | Admitting: Cardiology

## 2017-02-22 ENCOUNTER — Ambulatory Visit (INDEPENDENT_AMBULATORY_CARE_PROVIDER_SITE_OTHER): Payer: Medicare Other | Admitting: Cardiology

## 2017-02-22 VITALS — BP 110/62 | HR 68 | Ht 72.0 in | Wt 174.0 lb

## 2017-02-22 DIAGNOSIS — I951 Orthostatic hypotension: Secondary | ICD-10-CM

## 2017-02-22 DIAGNOSIS — G4733 Obstructive sleep apnea (adult) (pediatric): Secondary | ICD-10-CM

## 2017-02-22 DIAGNOSIS — R600 Localized edema: Secondary | ICD-10-CM

## 2017-02-22 DIAGNOSIS — E7800 Pure hypercholesterolemia, unspecified: Secondary | ICD-10-CM | POA: Diagnosis not present

## 2017-02-22 DIAGNOSIS — I2510 Atherosclerotic heart disease of native coronary artery without angina pectoris: Secondary | ICD-10-CM

## 2017-02-22 NOTE — Patient Instructions (Signed)
Medication Instructions:  Your physician recommends that you continue on your current medications as directed. Please refer to the Current Medication list given to you today.  Labwork: None ordered   Testing/Procedures: None ordered   Follow-Up: Your physician wants you to follow-up in: 1 year with Dr. Turner. You will receive a reminder letter in the mail two months in advance. If you don't receive a letter, please call our office to schedule the follow-up appointment.   Any Other Special Instructions Will Be Listed Below (If Applicable).     If you need a refill on your cardiac medications before your next appointment, please call your pharmacy.  

## 2017-02-22 NOTE — Progress Notes (Signed)
Cardiology Office Note:    Date:  02/22/2017   ID:  [name], DOB 1950/04/22, MRN [id number]  PCP:  Jinny Sanders, MD  Cardiologist:  Fransico Him, MD   Referring MD: Jinny Sanders, MD   Chief Complaint  Patient presents with  . Coronary Artery Disease  . Hyperlipidemia  . Sleep Apnea    History of Present Illness:    Shawn Lam is a 66 y.o. male with a hx of ASCAD with PCI of the LAD and RCA , dyslipidemia, low normal LVF with EF 50-55% with inferior HK and mild dilated aortic root at 10mm by echo 06/2016.  He also has had siginficant problems with orthostatic hypotension that have recently been on proamatine. He had also been on Florinef but this was stopped due to LE edema but edema did not improve.  LE venous dopplers were negative for DVT and he has been seen n Lymphedema clinic in [place] .  He also has moderate OSA with AHI of 17/hr and is compliant on CPAP at 10cm H2O.   He is here today for followup and is doing well.  He denies any chest pain or pressure, SOB, DOE, PND, orthopnea, LE edema, dizziness, palpitations or syncope. His lymphedema has resolved after going to lymphedema clinic.  He is compliant with his meds and is tolerating meds with no SE.  he is doing well with his CPAP device.  He tolerates the mask and feels the pressure is adequate.  He continues to feel rested in the am and has no significant daytime sleepiness.  He denies any significant mouth or nasal dryness or nasal congestion.  He does not think that he snores.  He has has some problems waking up at night recently and I encouraged him to go to bed later and try melatonin.    Past Medical History:  Diagnosis Date  . Anemia   . Anxiety   . Asymptomatic LV dysfunction    50-55% by echo 06/2016  . Berger's disease   . CAD (coronary artery disease) 06/2005   PCI of LAD and RCA  . Depression   . Hematuria   . Hyperlipidemia   . Insomnia   . Left ureteral stone   . Orthostatic  hypotension    negative tilt table  . OSA (obstructive sleep apnea)    moderate with AHI 17/hr  . PVC's (premature ventricular contractions) 07/07/2016  . Renal disorder    PT IS SEEING DR. Lorrene Reid- NEPHROLOGIST  . Rosacea   . Urticaria     Past Surgical History:  Procedure Laterality Date  . CARDIAC CATHETERIZATION  09/06/2013   [name]  . CARDIAC CATHETERIZATION    . CORONARY ANGIOPLASTY  2004   STENT PLACEMENT  . LEFT KNEE CAP  SURGERY ABOUT 40 YRS AGO  ? 1970      Current Medications: Current Meds  Medication Sig  . aspirin EC 81 MG tablet Take 81 mg by mouth at bedtime.   [name] atorvastatin (LIPITOR) 40 MG tablet TAKE 1 TABLET BY MOUTH DAILY.  [name] clopidogrel (PLAVIX) 75 MG tablet TAKE 1 TABLET (75 MG TOTAL) BY MOUTH DAILY.  . [facility] IRON 325 (65 Fe) MG tablet TAKE 1 TABLET BY MOUTH 3 TIMES DAILY WITH MEALS.  . fludrocortisone (FLORINEF) 0.1 MG tablet Take 1 tablet (0.1 mg total) by mouth daily.  [name] FLUOCINOLONE ACETONIDE BODY 0.01 % OIL Apply 1 application topically daily as needed (for dry skin).   [name] FLUoxetine (PROZAC)  20 MG capsule Take 1 capsule (20 mg total) by mouth daily.  [name] FLUoxetine (PROZAC) 40 MG capsule Take 1 capsule (40 mg total) by mouth every morning.  . furosemide (LASIX) 20 MG tablet Take 1 tablet (20 mg total) by mouth daily.  Javier Docker Oil 500 MG CAPS Take 500 mg by mouth daily.  [name] levothyroxine (SYNTHROID, LEVOTHROID) 25 MCG tablet Take 1 tablet (25 mcg total) by mouth daily before breakfast.  . midodrine (PROAMATINE) 5 MG tablet TAKE 1 TABLET (5 MG TOTAL) BY MOUTH 2 (TWO) TIMES DAILY WITH BREAKFAST AND LUNCH.  . mirtazapine (REMERON) 15 MG tablet Take 1 tablet (15 mg total) by mouth at bedtime.  . Multiple Vitamins-Minerals (CENTRUM ADULTS PO) Take 1 tablet by mouth every morning.   [name] NITROSTAT 0.4 MG SL tablet PLACE 1 TABLET (0.4 MG TOTAL) UNDER THE TONGUE EVERY 5 (FIVE) MINUTES AS NEEDED FOR CHEST PAIN.  [name] Teriparatide, Recombinant, (FORTEO) 600 MCG/2.4ML SOLN  Inject 20 mcg daily into the skin.   [name] vitamin B-12 (CYANOCOBALAMIN) 1000 MCG tablet Take 1,000 mcg by mouth at bedtime.      Allergies:   Codeine and Tape   Social History   Socioeconomic History  . Marital status: Married    Spouse name: None  . Number of children: None  . Years of education: None  . Highest education level: None  Social Needs  . Financial resource strain: None  . Food insecurity - worry: None  . Food insecurity - inability: None  . Transportation needs - medical: None  . Transportation needs - non-medical: None  Occupational History  . Occupation: retired Print production planner  Tobacco Use  . Smoking status: Never Smoker  . Smokeless tobacco: Never Used  Substance and Sexual Activity  . Alcohol use: No    Alcohol/week: 0.0 oz    Comment: Occasional beer  . Drug use: No  . Sexual activity: Yes    Partners: Female    Birth control/protection: None  Other Topics Concern  . None  Social History Narrative   Regular exercise--yes--jog 5 miles 6 days a week     Family History: The patient's family history includes Cancer in his father; Coronary artery disease in his mother; Diabetes in his brother; Heart attack in his mother; Heart disease in his mother; Hypertension in his mother; Lung cancer in his father.  ROS:   Please see the history of present illness.    ROS  All other systems reviewed and negative.   EKGs/Labs/Other Studies Reviewed:    The following studies were reviewed today: CPAP download  EKG:  EKG is not ordered today.    Recent Labs: 06/03/2016: Hemoglobin 12.0; Platelets 215.0; Pro B Natriuretic peptide (BNP) 38.0 11/30/2016: TSH 5.79 02/06/2017: ALT 15; BUN 15; Creatinine, Ser 1.26; Potassium 4.4; Sodium 144   Recent Lipid Panel    Component Value Date/Time   CHOL 155 02/06/2017 0931   CHOL 145 07/19/2016 0853   TRIG 46.0 02/06/2017 0931   HDL 74.60 02/06/2017 0931   HDL 70 07/19/2016 0853   CHOLHDL 2 02/06/2017 0931   VLDL 9.2  02/06/2017 0931   LDLCALC 72 02/06/2017 0931   LDLCALC 63 07/19/2016 0853   LDLDIRECT 57 06/22/2009 1228    Physical Exam:    VS:  BP 110/62   Pulse 68   Ht 6' (1.829 m)   Wt 174 lb (78.9 kg)   BMI 23.60 kg/m     Wt Readings from Last 3 Encounters:  02/22/17 174 lb (78.9 kg)  02/10/17 175 lb 8 oz (79.6 kg)  11/02/16 163 lb 4 oz (74 kg)     GEN:  Well nourished, well developed in no acute distress HEENT: Normal NECK: No JVD; No carotid bruits LYMPHATICS: No lymphadenopathy CARDIAC: RRR, no murmurs, rubs, gallops RESPIRATORY:  Clear to auscultation without rales, wheezing or rhonchi  ABDOMEN: Soft, non-tender, non-distended MUSCULOSKELETAL:  No edema; No deformity  SKIN: Warm and dry NEUROLOGIC:  Alert and oriented x 3 PSYCHIATRIC:  Normal affect   ASSESSMENT:    1. Coronary artery disease involving native coronary artery of native heart without angina pectoris   2. Orthostatic hypotension   3. OSA (obstructive sleep apnea)   4. Bilateral leg edema   5. Pure hypercholesterolemia    PLAN:    In order of problems listed above:  1.  ASCAD - s/p remote PCI of the LAD and RCA.  He denies any anginal chest pain.  Continue ASA 81mg  daily, Plavix 75mg  daily and statin.   2.  Orthostatic hypotension - stable on midodrine and florinef.   3.  OSA - the patient is tolerating PAP therapy well without any problems. The PAP download was reviewed today and showed an AHI of 3.2/hr on 10 cm H2O with 100% compliance in using more than 4 hours nightly.  The patient has been using and benefiting from CPAP use and will continue to benefit from therapy.   4.  Bilateral leg edema secondary to lymphedema - this has resolved after going to lymphedema clinic.  Continue diuretics and compression hose.   5.  Hyperlipidemia - LDL goal < 70. Continue on atorvastatin 40mg  daily.  His recent LDL was 72.     Medication Adjustments/Labs and Tests Ordered: Current medicines are reviewed at length  with the patient today.  Concerns regarding medicines are outlined above.  No orders of the defined types were placed in this encounter.  No orders of the defined types were placed in this encounter.   Signed, Fransico Him, MD  02/22/2017 1:47 PM    [place]

## 2017-02-23 ENCOUNTER — Other Ambulatory Visit: Payer: Self-pay | Admitting: Family Medicine

## 2017-02-23 ENCOUNTER — Ambulatory Visit (INDEPENDENT_AMBULATORY_CARE_PROVIDER_SITE_OTHER): Payer: Medicare Other | Admitting: *Deleted

## 2017-02-23 DIAGNOSIS — Z23 Encounter for immunization: Secondary | ICD-10-CM | POA: Diagnosis not present

## 2017-03-09 ENCOUNTER — Ambulatory Visit (INDEPENDENT_AMBULATORY_CARE_PROVIDER_SITE_OTHER): Payer: Medicare Other | Admitting: *Deleted

## 2017-03-09 DIAGNOSIS — R002 Palpitations: Secondary | ICD-10-CM | POA: Diagnosis not present

## 2017-03-10 NOTE — Progress Notes (Signed)
[facility] Summary Report / Loop Recorder 

## 2017-03-17 ENCOUNTER — Other Ambulatory Visit: Payer: Self-pay | Admitting: Cardiology

## 2017-03-22 ENCOUNTER — Other Ambulatory Visit: Payer: Self-pay | Admitting: Cardiology

## 2017-03-23 LAB — CUP PACEART REMOTE DEVICE CHECK
Implantable Pulse Generator Implant Date: 20180202
MDC IDC SESS DTM: 20181129223935

## 2017-03-27 ENCOUNTER — Encounter: Payer: Self-pay | Admitting: Psychiatry

## 2017-03-27 ENCOUNTER — Ambulatory Visit (INDEPENDENT_AMBULATORY_CARE_PROVIDER_SITE_OTHER): Payer: Medicare Other | Admitting: Psychiatry

## 2017-03-27 VITALS — BP 110/68 | HR 67 | Ht 70.0 in | Wt 175.0 lb

## 2017-03-27 DIAGNOSIS — F411 Generalized anxiety disorder: Secondary | ICD-10-CM | POA: Diagnosis not present

## 2017-03-27 DIAGNOSIS — F39 Unspecified mood [affective] disorder: Secondary | ICD-10-CM | POA: Diagnosis not present

## 2017-03-27 DIAGNOSIS — I2510 Atherosclerotic heart disease of native coronary artery without angina pectoris: Secondary | ICD-10-CM | POA: Diagnosis not present

## 2017-03-27 MED ORDER — MIRTAZAPINE 15 MG PO TABS: 15.0000 mg | ORAL_TABLET | Freq: Every day | ORAL | 1 refills | Status: DC

## 2017-03-27 MED ORDER — FLUOXETINE HCL 20 MG PO CAPS: 20.0000 mg | ORAL_CAPSULE | Freq: Every day | ORAL | 1 refills | Status: DC

## 2017-03-27 MED ORDER — FLUOXETINE HCL 40 MG PO CAPS: 40.0000 mg | ORAL_CAPSULE | Freq: Every morning | ORAL | 1 refills | Status: DC

## 2017-03-27 NOTE — Progress Notes (Signed)
Psychiatric MD Progress Note   Patient Identification: Shawn Lam MRN:  [id number] Date of Evaluation:  03/27/2017 Referral Source: Stanton Kidney- Therapist Chief Complaint:    Visit Diagnosis:    ICD-10-CM   1. Episodic mood disorder ([place]) F39   2. Generalized anxiety disorder F41.1     History of Present Illness:    Patient is a 66 year old married male who presented for follow-up. He reported that he has been doing well and has been taking his medications as prescribed. He reported that he continues to have injections in his back and has also been running at least 10 miles on a daily basis. He reported that he feels better. He reported that the medications are helping him. Patient reported that he has fair sleep and wakes up after a few hours. He feels rested at night. He was discussing some relationship issues with his stepson as he has been counting on his wife and he feels anxious about the same. Patient reported that he does not say anything. He reported that he is looking forward to the holidays as his sons live in [place]. He denied having any suicidal homicidal ideations or plans. He denied having any perceptual disturbances. Patient is compliant with his medications at this time.   Associated Signs/Symptoms: Depression Symptoms:  fatigue, anxiety, decreased appetite, (Hypo) Manic Symptoms:  Labiality of Mood, Anxiety Symptoms:  Excessive Worry, Psychotic Symptoms:  none PTSD Symptoms: Negative NA  Past Psychiatric History:  Pt reported history of anxiety and has been taking medications including Zoloft and Xanax prescribed by his primary care physician. He denied any previous history of psychiatric hospitalization. No history of suicide attempt.  Previous Psychotropic Medications: Xanax 0.5 mg by mouth twice a day Zoloft 100 mg by mouth twice a day   Substance Abuse History in the last 12 months:  Yes.   3 beers/week  Consequences of Substance  Abuse: Negative NA  Past Medical History:  Past Medical History:  Diagnosis Date  . Anemia   . Anxiety   . Asymptomatic LV dysfunction    50-55% by echo 06/2016  . Berger's disease   . CAD (coronary artery disease) 06/2005   PCI of LAD and RCA  . Depression   . Hematuria   . Hyperlipidemia   . Insomnia   . Left ureteral stone   . Orthostatic hypotension    negative tilt table  . OSA (obstructive sleep apnea)    moderate with AHI 17/hr  . PVC's (premature ventricular contractions) 07/07/2016  . Renal disorder    PT IS SEEING DR. Lorrene Reid- NEPHROLOGIST  . Rosacea   . Urticaria     Past Surgical History:  Procedure Laterality Date  . CARDIAC CATHETERIZATION  09/06/2013   [place]  . CARDIAC CATHETERIZATION    . CORONARY ANGIOPLASTY  2004   STENT PLACEMENT  . CYSTOSCOPY WITH RETROGRADE PYELOGRAM, URETEROSCOPY AND STENT PLACEMENT Left 03/19/2014   Procedure: CYSTOSCOPY WITH RETROGRADE PYELOGRAM, URETEROSCOPY AND STENT PLACEMENT;  Surgeon: Junious Dresser, MD;  Location: [facility];  Service: Urology;  Laterality: Left;  . CYSTOSCOPY WITH RETROGRADE PYELOGRAM, URETEROSCOPY AND STENT PLACEMENT Bilateral 05/20/2015   Procedure:  CYSTOSCOPY WITH BILATERAL RETROGRADE PYELOGRAM,RIGHT  DIAGNOSTIC URETEROSCOPY ,LEFT URETEROSCOPY WITH HOLMIUM LASER  AND BILATERAL  STENT PLACEMENT ;  Surgeon: Alexis Frock, MD;  Location: [facility];  Service: Urology;  Laterality: Bilateral;  . EP IMPLANTABLE DEVICE N/A 05/13/2016   Procedure: Loop Recorder Insertion;  Surgeon: Deboraha Sprang, MD;  Location:  [facility];  Service: Cardiovascular;  Laterality: N/A;  . HOLMIUM LASER APPLICATION Bilateral 06/15/6438   Procedure: HOLMIUM LASER APPLICATION;  Surgeon: Alexis Frock, MD;  Location: [facility];  Service: Urology;  Laterality: Bilateral;  . LEFT HEART CATHETERIZATION WITH CORONARY ANGIOGRAM N/A 09/06/2013   Procedure: LEFT HEART CATHETERIZATION WITH CORONARY ANGIOGRAM;  Surgeon: Sinclair Grooms, MD;   [facility];  Service: Cardiovascular;  Laterality: N/A;  . LEFT KNEE CAP  SURGERY ABOUT 40 YRS AGO  ? 1970    . STONE EXTRACTION WITH BASKET Left 03/19/2014   Procedure: STONE EXTRACTION WITH BASKET;  Surgeon: Junious Dresser, MD;  Location: [facility];  Service: Urology;  Laterality: Left;    Family Psychiatric History:  Pt denied  Family History:  Family History  Problem Relation Age of Onset  . Coronary artery disease Mother   . Heart attack Mother   . Heart disease Mother   . Hypertension Mother   . Cancer Father        lung and colon  . Lung cancer Father        smoker  . Diabetes Brother     Social History:   Social History   Socioeconomic History  . Marital status: Married    Spouse name: None  . Number of children: None  . Years of education: None  . Highest education level: None  Social Needs  . Financial resource strain: None  . Food insecurity - worry: None  . Food insecurity - inability: None  . Transportation needs - medical: None  . Transportation needs - non-medical: None  Occupational History  . Occupation: retired Print production planner  Tobacco Use  . Smoking status: Never Smoker  . Smokeless tobacco: Never Used  Substance and Sexual Activity  . Alcohol use: None    Comment: Occasional beer  . Drug use: No  . Sexual activity: Yes    Partners: Female    Birth control/protection: None  Other Topics Concern  . 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  Worked for [employer].   Allergies:   Allergies  Allergen Reactions  . Codeine Nausea Only  . Tape Other (See Comments)    Use only paper tape. Severe blistering and bruising with other tapes. No cloth tape.    Metabolic Disorder Labs: Lab Results  Component Value Date   HGBA1C 5.1 08/11/2016    No results found for: PROLACTIN Lab Results  Component Value Date   CHOL 155 02/06/2017   TRIG 46.0 02/06/2017   HDL 74.60 02/06/2017   CHOLHDL 2 02/06/2017   VLDL 9.2 02/06/2017   LDLCALC 72 02/06/2017   LDLCALC 63 07/19/2016     Current Medications: Current Outpatient Medications  Medication Sig Dispense Refill  . aspirin EC 81 MG tablet Take 81 mg by mouth at bedtime.     [name] atorvastatin (LIPITOR) 40 MG tablet TAKE 1 TABLET BY MOUTH DAILY. 90 tablet 3  . clopidogrel (PLAVIX) 75 MG tablet Take 1 tablet (75 mg total) by mouth daily. 90 tablet 3  . ferrous sulfate 325 (65 FE) MG tablet TAKE 1 TABLET BY MOUTH 3 TIMES DAILY WITH MEALS. 270 tablet 3  . fludrocortisone (FLORINEF) 0.1 MG tablet Take 1 tablet (  0.1 mg total) by mouth daily. 30 tablet 11  . FLUOCINOLONE ACETONIDE BODY 0.01 % OIL Apply 1 application topically daily as needed (for dry skin).     [name] FLUoxetine (PROZAC) 20 MG capsule Take 1 capsule (20 mg total) by mouth daily. 90 capsule 1  . FLUoxetine (PROZAC) 40 MG capsule Take 1 capsule (40 mg total) by mouth every morning. 90 capsule 1  . furosemide (LASIX) 20 MG tablet Take 1 tablet (20 mg total) by mouth daily. 90 tablet 3  . Krill Oil 500 MG CAPS Take 500 mg by mouth daily.    [name] levothyroxine (SYNTHROID, LEVOTHROID) 25 MCG tablet Take 1 tablet (25 mcg total) by mouth daily before breakfast. 30 tablet 11  . midodrine (PROAMATINE) 5 MG tablet TAKE 1 TABLET (5 MG TOTAL) BY MOUTH 2 (TWO) TIMES DAILY WITH BREAKFAST AND LUNCH. 180 tablet 3  . mirtazapine (REMERON) 15 MG tablet Take 1 tablet (15 mg total) by mouth at bedtime. 90 tablet 1  . Multiple Vitamins-Minerals (CENTRUM ADULTS PO) Take 1 tablet by mouth every morning.     [name] NITROSTAT 0.4 MG SL tablet PLACE 1 TABLET (0.4 MG TOTAL) UNDER THE TONGUE EVERY 5 (FIVE) MINUTES AS NEEDED FOR CHEST PAIN. 25 tablet 5  . Teriparatide, Recombinant, (FORTEO) 600 MCG/2.4ML SOLN Inject 20 mcg daily into the skin.     [name] vitamin B-12  (CYANOCOBALAMIN) 1000 MCG tablet Take 1,000 mcg by mouth at bedtime.      No current facility-administered medications for this visit.     Neurologic: Headache: No Seizure: No Paresthesias:No  Musculoskeletal: Strength & Muscle Tone: within normal limits Gait & Station: normal Patient leans: N/A  Psychiatric Specialty Exam: Review of Systems  Musculoskeletal: Positive for back pain.  Psychiatric/Behavioral: Positive for depression. The patient is nervous/anxious.     Blood pressure 110/68, pulse 67, height 5\' 10"  (1.778 m), weight 175 lb (79.4 kg), SpO2 98 %.Body mass index is 25.11 kg/m.  General Appearance: Casual  Eye Contact:  Fair  Speech:  Clear and Coherent  Volume:  Normal  Mood:  Anxious  Affect:  Congruent  Thought Process:  Goal Directed  Orientation:  Full (Time, Place, and Person)  Thought Content:  WDL  Suicidal Thoughts:  No  Homicidal Thoughts:  No  Memory:  Immediate;   Fair Recent;   Fair Remote;   Fair  Judgement:  Fair  Insight:  Fair  Psychomotor Activity:  Normal  Concentration:  Concentration: Fair and Attention Span: Fair  Recall:  [employer] of Knowledge:Fair  Language: Fair  Akathisia:  No  Handed:  Right  AIMS (if indicated):    Assets:  Communication Skills Desire for Improvement Physical Health Social Support  ADL's:  Intact  Cognition: WNL  Sleep:  poor    Treatment Plan Summary: Medication management   Discussed with patient at length about the medications treatment risks benefits and alternatives.  Continue  Remeron 15 mg by mouth daily at bedtime to help with his anxiety insomnia and depression. Continue  Prozac 60 mg at this time to help with his anxiety.    He will follow-up in 3 months or earlier depending on his symptoms.     More than 50% of the time spent in psychoeducation, counseling and coordination of care.    This note was generated in part or whole with voice recognition software. Voice regonition is  usually quite accurate but there are transcription errors that can and very often do occur.  I apologize for any typographical errors that were not detected and corrected.   Rainey Pines, MD 12/17/20181:11 PM

## 2017-03-29 ENCOUNTER — Other Ambulatory Visit: Payer: Self-pay | Admitting: Family Medicine

## 2017-04-06 DIAGNOSIS — M48061 Spinal stenosis, lumbar region without neurogenic claudication: Secondary | ICD-10-CM | POA: Diagnosis not present

## 2017-04-06 DIAGNOSIS — M4856XD Collapsed vertebra, not elsewhere classified, lumbar region, subsequent encounter for fracture with routine healing: Secondary | ICD-10-CM | POA: Diagnosis not present

## 2017-04-10 ENCOUNTER — Ambulatory Visit (INDEPENDENT_AMBULATORY_CARE_PROVIDER_SITE_OTHER): Payer: Medicare Other | Admitting: *Deleted

## 2017-04-10 ENCOUNTER — Other Ambulatory Visit: Payer: Self-pay | Admitting: Orthopedic Surgery

## 2017-04-10 DIAGNOSIS — R002 Palpitations: Secondary | ICD-10-CM | POA: Diagnosis not present

## 2017-04-10 DIAGNOSIS — M48061 Spinal stenosis, lumbar region without neurogenic claudication: Secondary | ICD-10-CM

## 2017-04-10 NOTE — Progress Notes (Signed)
[facility] Summary Report / Loop Recorder 

## 2017-04-17 ENCOUNTER — Ambulatory Visit
Admission: RE | Admit: 2017-04-17 | Discharge: 2017-04-17 | Disposition: A | Discharge status: Home / Self Care | Payer: Medicare Other | Source: Ambulatory Visit | Attending: Orthopedic Surgery | Admitting: Orthopedic Surgery

## 2017-04-17 DIAGNOSIS — S32010A Wedge compression fracture of first lumbar vertebra, initial encounter for closed fracture: Secondary | ICD-10-CM | POA: Diagnosis not present

## 2017-04-17 DIAGNOSIS — M4854XA Collapsed vertebra, not elsewhere classified, thoracic region, initial encounter for fracture: Secondary | ICD-10-CM | POA: Insufficient documentation

## 2017-04-17 DIAGNOSIS — M48061 Spinal stenosis, lumbar region without neurogenic claudication: Secondary | ICD-10-CM | POA: Insufficient documentation

## 2017-04-17 DIAGNOSIS — S22080A Wedge compression fracture of T11-T12 vertebra, initial encounter for closed fracture: Secondary | ICD-10-CM | POA: Diagnosis not present

## 2017-04-20 LAB — CUP PACEART REMOTE DEVICE CHECK
Implantable Pulse Generator Implant Date: 20180202
MDC IDC SESS DTM: 20181229230758

## 2017-05-02 DIAGNOSIS — M48061 Spinal stenosis, lumbar region without neurogenic claudication: Secondary | ICD-10-CM | POA: Diagnosis not present

## 2017-05-02 DIAGNOSIS — M4856XD Collapsed vertebra, not elsewhere classified, lumbar region, subsequent encounter for fracture with routine healing: Secondary | ICD-10-CM | POA: Diagnosis not present

## 2017-05-05 DIAGNOSIS — M48061 Spinal stenosis, lumbar region without neurogenic claudication: Secondary | ICD-10-CM | POA: Diagnosis not present

## 2017-05-08 ENCOUNTER — Ambulatory Visit (INDEPENDENT_AMBULATORY_CARE_PROVIDER_SITE_OTHER): Payer: Medicare Other | Admitting: *Deleted

## 2017-05-08 DIAGNOSIS — R002 Palpitations: Secondary | ICD-10-CM | POA: Diagnosis not present

## 2017-05-09 NOTE — Progress Notes (Signed)
[facility] Summary Report / Loop Recorder 

## 2017-05-15 ENCOUNTER — Other Ambulatory Visit: Payer: Self-pay | Admitting: Family Medicine

## 2017-05-23 LAB — CUP PACEART REMOTE DEVICE CHECK
Date Time Interrogation Session: 20190129004036
MDC IDC PG IMPLANT DT: 20180202

## 2017-05-25 DIAGNOSIS — M8589 Other specified disorders of bone density and structure, multiple sites: Secondary | ICD-10-CM | POA: Diagnosis not present

## 2017-05-25 DIAGNOSIS — M48061 Spinal stenosis, lumbar region without neurogenic claudication: Secondary | ICD-10-CM | POA: Diagnosis not present

## 2017-05-25 DIAGNOSIS — M4856XD Collapsed vertebra, not elsewhere classified, lumbar region, subsequent encounter for fracture with routine healing: Secondary | ICD-10-CM | POA: Diagnosis not present

## 2017-05-25 DIAGNOSIS — M818 Other osteoporosis without current pathological fracture: Secondary | ICD-10-CM | POA: Diagnosis not present

## 2017-06-07 ENCOUNTER — Telehealth: Payer: Self-pay | Admitting: Family Medicine

## 2017-06-07 NOTE — Telephone Encounter (Signed)
Copied from [place] [id number]. Topic: Quick Communication - Rx Refill/Question >> Jun 07, 2017  4:56 PM Arletha Grippe wrote: Medication: FLUoxetine (PROZAC) 40 MG capsule both    FLUoxetine (PROZAC) 20 MG capsule, and   Has the patient contacted their pharmacy? Yes.     (Agent: If no, request that the patient contact the pharmacy for the refill.)   Preferred Pharmacy (with phone number or street name): [facility]    Agent: Please be advised that RX refills may take up to 3 business days. We ask that you follow-up with your pharmacy.

## 2017-06-08 NOTE — Telephone Encounter (Signed)
Message  Left on  VM   For patient  To call back to inform him  That  The  Rx  Was  Filled  By   Another provider

## 2017-06-09 ENCOUNTER — Other Ambulatory Visit: Payer: Self-pay | Admitting: Family Medicine

## 2017-06-09 ENCOUNTER — Ambulatory Visit: Payer: Self-pay | Admitting: *Deleted

## 2017-06-09 MED ORDER — FLUOXETINE HCL 20 MG PO CAPS: 20.0000 mg | ORAL_CAPSULE | Freq: Every day | ORAL | 1 refills | Status: DC

## 2017-06-09 MED ORDER — FLUOXETINE HCL 40 MG PO CAPS: 40.0000 mg | ORAL_CAPSULE | Freq: Every morning | ORAL | 1 refills | Status: DC

## 2017-06-09 NOTE — Telephone Encounter (Signed)
Note routed to provider re: refill request.

## 2017-06-09 NOTE — Telephone Encounter (Signed)
Requesting refill of Prozac  LOV 02/10/17  Dr .Diona Browner  RX by Dr. Rainey Pines. He no longer sees this physician.  Pt states Dr. Diona Browner aware.  [facility]eds [facility]  [facility] [id number]  [phone]

## 2017-06-09 NOTE — Telephone Encounter (Signed)
Pt  Called  Again  And  Voice  Mail  Left  That the  Rx  For prozac  Was  Filled  By  Dr  Rainey Pines  And  He  Should  contacy  His  Pharmacy  About  Refill  Request  Or  Call  Dr Leandro Reasoner  Office

## 2017-06-12 ENCOUNTER — Ambulatory Visit (INDEPENDENT_AMBULATORY_CARE_PROVIDER_SITE_OTHER): Payer: Medicare Other | Admitting: *Deleted

## 2017-06-12 ENCOUNTER — Encounter: Payer: Self-pay | Admitting: Family Medicine

## 2017-06-12 DIAGNOSIS — R002 Palpitations: Secondary | ICD-10-CM

## 2017-06-12 MED ORDER — FLUOXETINE HCL 40 MG PO CAPS: 40.0000 mg | ORAL_CAPSULE | Freq: Every morning | ORAL | 1 refills | Status: DC

## 2017-06-12 NOTE — Addendum Note (Signed)
Addended by: Carter Kitten on: 06/12/2017 12:42 PM   Modules accepted: Orders

## 2017-06-12 NOTE — Progress Notes (Signed)
[facility] Summary Report / Loop Recorder 

## 2017-06-14 ENCOUNTER — Other Ambulatory Visit: Payer: Self-pay

## 2017-06-14 MED ORDER — FUROSEMIDE 20 MG PO TABS: 20.0000 mg | ORAL_TABLET | Freq: Every day | ORAL | 2 refills | Status: DC

## 2017-06-19 ENCOUNTER — Ambulatory Visit: Payer: Medicare Other | Admitting: Psychiatry

## 2017-06-22 DIAGNOSIS — N200 Calculus of kidney: Secondary | ICD-10-CM | POA: Diagnosis not present

## 2017-06-22 DIAGNOSIS — M810 Age-related osteoporosis without current pathological fracture: Secondary | ICD-10-CM | POA: Diagnosis not present

## 2017-06-22 DIAGNOSIS — Z95818 Presence of other cardiac implants and grafts: Secondary | ICD-10-CM | POA: Diagnosis not present

## 2017-06-22 DIAGNOSIS — M4850XA Collapsed vertebra, not elsewhere classified, site unspecified, initial encounter for fracture: Secondary | ICD-10-CM | POA: Diagnosis not present

## 2017-06-22 DIAGNOSIS — I2510 Atherosclerotic heart disease of native coronary artery without angina pectoris: Secondary | ICD-10-CM | POA: Diagnosis not present

## 2017-06-22 DIAGNOSIS — I951 Orthostatic hypotension: Secondary | ICD-10-CM | POA: Diagnosis not present

## 2017-06-22 DIAGNOSIS — E611 Iron deficiency: Secondary | ICD-10-CM | POA: Diagnosis not present

## 2017-06-22 DIAGNOSIS — R609 Edema, unspecified: Secondary | ICD-10-CM | POA: Diagnosis not present

## 2017-06-22 DIAGNOSIS — N183 Chronic kidney disease, stage 3 (moderate): Secondary | ICD-10-CM | POA: Diagnosis not present

## 2017-07-05 ENCOUNTER — Encounter: Payer: Self-pay | Admitting: Cardiology

## 2017-07-05 ENCOUNTER — Other Ambulatory Visit: Payer: Self-pay

## 2017-07-05 ENCOUNTER — Ambulatory Visit (HOSPITAL_COMMUNITY): Payer: Medicare Other | Attending: Cardiology

## 2017-07-05 DIAGNOSIS — E785 Hyperlipidemia, unspecified: Secondary | ICD-10-CM | POA: Diagnosis not present

## 2017-07-05 DIAGNOSIS — I517 Cardiomegaly: Secondary | ICD-10-CM | POA: Insufficient documentation

## 2017-07-05 DIAGNOSIS — I77810 Thoracic aortic ectasia: Secondary | ICD-10-CM | POA: Diagnosis not present

## 2017-07-05 DIAGNOSIS — I2510 Atherosclerotic heart disease of native coronary artery without angina pectoris: Secondary | ICD-10-CM | POA: Insufficient documentation

## 2017-07-06 ENCOUNTER — Telehealth: Payer: Self-pay

## 2017-07-06 ENCOUNTER — Other Ambulatory Visit: Payer: Self-pay | Admitting: Physician Assistant

## 2017-07-06 DIAGNOSIS — I77810 Thoracic aortic ectasia: Secondary | ICD-10-CM

## 2017-07-06 NOTE — Telephone Encounter (Signed)
Notes recorded by Teressa Senter, RN on 07/06/2017 at 4:45 PM EDT Patient made aware of echo results. He verbalized understanding and thankful for the call. Echo ordered, to be scheduled in a year.   Notes recorded by Sueanne Margarita, MD on 07/05/2017 at 3:52 PM EDT Echo showed normal LVF with EF 60-65% with mildly dilated aortic root at 4cm - repeat echo in 1 year

## 2017-07-13 ENCOUNTER — Ambulatory Visit (INDEPENDENT_AMBULATORY_CARE_PROVIDER_SITE_OTHER): Payer: Medicare Other | Admitting: *Deleted

## 2017-07-13 DIAGNOSIS — R002 Palpitations: Secondary | ICD-10-CM

## 2017-07-17 NOTE — Progress Notes (Signed)
[facility] Summary Report / Loop Recorder 

## 2017-07-19 DIAGNOSIS — Z125 Encounter for screening for malignant neoplasm of prostate: Secondary | ICD-10-CM | POA: Diagnosis not present

## 2017-07-21 LAB — CUP PACEART REMOTE DEVICE CHECK
Implantable Pulse Generator Implant Date: 20180202
MDC IDC SESS DTM: 20190303010802

## 2017-07-25 DIAGNOSIS — R3915 Urgency of urination: Secondary | ICD-10-CM | POA: Diagnosis not present

## 2017-07-25 DIAGNOSIS — N200 Calculus of kidney: Secondary | ICD-10-CM | POA: Diagnosis not present

## 2017-08-01 DIAGNOSIS — Z961 Presence of intraocular lens: Secondary | ICD-10-CM | POA: Diagnosis not present

## 2017-08-01 DIAGNOSIS — H18413 Arcus senilis, bilateral: Secondary | ICD-10-CM | POA: Diagnosis not present

## 2017-08-01 DIAGNOSIS — H40013 Open angle with borderline findings, low risk, bilateral: Secondary | ICD-10-CM | POA: Diagnosis not present

## 2017-08-01 DIAGNOSIS — H16223 Keratoconjunctivitis sicca, not specified as Sjogren's, bilateral: Secondary | ICD-10-CM | POA: Diagnosis not present

## 2017-08-07 DIAGNOSIS — L570 Actinic keratosis: Secondary | ICD-10-CM | POA: Diagnosis not present

## 2017-08-07 DIAGNOSIS — L578 Other skin changes due to chronic exposure to nonionizing radiation: Secondary | ICD-10-CM | POA: Diagnosis not present

## 2017-08-07 DIAGNOSIS — Z85828 Personal history of other malignant neoplasm of skin: Secondary | ICD-10-CM | POA: Diagnosis not present

## 2017-08-07 DIAGNOSIS — L821 Other seborrheic keratosis: Secondary | ICD-10-CM | POA: Diagnosis not present

## 2017-08-15 ENCOUNTER — Ambulatory Visit (INDEPENDENT_AMBULATORY_CARE_PROVIDER_SITE_OTHER): Payer: Medicare Other | Admitting: *Deleted

## 2017-08-15 DIAGNOSIS — R002 Palpitations: Secondary | ICD-10-CM | POA: Diagnosis not present

## 2017-08-16 LAB — CUP PACEART REMOTE DEVICE CHECK
Date Time Interrogation Session: 20190405013911
Implantable Pulse Generator Implant Date: 20180202

## 2017-08-16 NOTE — Progress Notes (Signed)
[facility] Summary Report / Loop Recorder 

## 2017-08-24 ENCOUNTER — Encounter: Payer: Self-pay | Admitting: Cardiology

## 2017-08-28 ENCOUNTER — Telehealth: Payer: Self-pay | Admitting: *Deleted

## 2017-08-28 DIAGNOSIS — G4733 Obstructive sleep apnea (adult) (pediatric): Secondary | ICD-10-CM

## 2017-08-28 NOTE — Telephone Encounter (Signed)
Per patient request Dr Radford Pax to refer patient to ENT for [facility] consult.

## 2017-09-05 LAB — CUP PACEART REMOTE DEVICE CHECK
Implantable Pulse Generator Implant Date: 20180202
MDC IDC SESS DTM: 20190508013941

## 2017-09-12 DIAGNOSIS — G4733 Obstructive sleep apnea (adult) (pediatric): Secondary | ICD-10-CM | POA: Diagnosis not present

## 2017-09-14 ENCOUNTER — Other Ambulatory Visit: Payer: Self-pay | Admitting: Otolaryngology

## 2017-09-18 ENCOUNTER — Ambulatory Visit (INDEPENDENT_AMBULATORY_CARE_PROVIDER_SITE_OTHER): Payer: Medicare Other | Admitting: *Deleted

## 2017-09-18 DIAGNOSIS — R002 Palpitations: Secondary | ICD-10-CM | POA: Diagnosis not present

## 2017-09-18 NOTE — Progress Notes (Signed)
[facility] Summary Report / Loop Recorder 

## 2017-09-20 ENCOUNTER — Telehealth: Payer: Self-pay | Admitting: Cardiology

## 2017-09-20 NOTE — Telephone Encounter (Signed)
Spoke w/ pt and requested that he send a manual transmission b/c his home monitor has not updated in at least 14 days.   

## 2017-09-26 ENCOUNTER — Encounter: Payer: Self-pay | Admitting: Cardiology

## 2017-10-16 ENCOUNTER — Telehealth: Payer: Self-pay | Admitting: Cardiology

## 2017-10-16 ENCOUNTER — Encounter (HOSPITAL_BASED_OUTPATIENT_CLINIC_OR_DEPARTMENT_OTHER): Payer: Self-pay | Admitting: *Deleted

## 2017-10-16 ENCOUNTER — Other Ambulatory Visit: Payer: Self-pay | Admitting: Family Medicine

## 2017-10-16 ENCOUNTER — Other Ambulatory Visit: Payer: Self-pay

## 2017-10-16 ENCOUNTER — Encounter: Payer: Self-pay | Admitting: *Deleted

## 2017-10-16 DIAGNOSIS — M8589 Other specified disorders of bone density and structure, multiple sites: Secondary | ICD-10-CM | POA: Diagnosis not present

## 2017-10-16 NOTE — Telephone Encounter (Signed)
   Primary Cardiologist:Traci Turner, MD  Chart reviewed as part of pre-operative protocol coverage. Because of [facility] past medical history and time since last visit, he/she will require a follow-up visit in order to better assess preoperative cardiovascular risk.  Pre-op covering staff: - Please schedule appointment and call patient to inform them. - Please contact requesting surgeon's office via preferred method (i.e, phone, fax) to inform them of need for appointment prior to surgery.  Cecilie Kicks, NP  10/16/2017, 2:08 PM

## 2017-10-16 NOTE — Telephone Encounter (Signed)
Pt is made aware of pre-op clearance appointment scheduled on 10/18/17 at 2:00pm with Jory Sims, NP. Pt verbal understanding. Fax sent via Epic over to Dr. Redmond Baseman office to inform them of appointment.

## 2017-10-16 NOTE — Telephone Encounter (Signed)
° ° °  ° °  [facility] Pre-operative Risk Assessment    Request for surgical clearance:  1. What type of surgery is being performed? Drug induced endoscopy  When is this surgery scheduled? 10/20/17 2. What type of clearance is required (medical clearance vs. Pharmacy clearance to hold med vs. Both)? Both  3. Are there any medications that need to be held prior to surgery and how long?  4. Practice name and name of physician performing surgery? Dr Redmond Baseman  5. What is your office phone number [phone]   7.   What is your office fax number [phone]- ATTN: Marcio Hoque  8.   Anesthesia type (None, local, MAC, general) ? general   Shawn Lam 10/16/2017, 12:49 PM  _________________________________________________________________   (provider comments below)

## 2017-10-16 NOTE — Telephone Encounter (Signed)
Yes ok to hold Plavix for procedure

## 2017-10-18 ENCOUNTER — Encounter: Payer: Self-pay | Admitting: Adult Health

## 2017-10-18 ENCOUNTER — Encounter (HOSPITAL_BASED_OUTPATIENT_CLINIC_OR_DEPARTMENT_OTHER)
Admission: RE | Admit: 2017-10-18 | Discharge: 2017-10-18 | Disposition: A | Discharge status: Home / Self Care | Payer: Medicare Other | Source: Ambulatory Visit | Attending: Otolaryngology | Admitting: Otolaryngology

## 2017-10-18 ENCOUNTER — Ambulatory Visit (INDEPENDENT_AMBULATORY_CARE_PROVIDER_SITE_OTHER): Payer: Medicare Other | Admitting: Adult Health

## 2017-10-18 VITALS — BP 112/72 | HR 66 | Ht 72.0 in | Wt 164.6 lb

## 2017-10-18 DIAGNOSIS — E7800 Pure hypercholesterolemia, unspecified: Secondary | ICD-10-CM | POA: Diagnosis not present

## 2017-10-18 DIAGNOSIS — L719 Rosacea, unspecified: Secondary | ICD-10-CM | POA: Diagnosis not present

## 2017-10-18 DIAGNOSIS — G4733 Obstructive sleep apnea (adult) (pediatric): Secondary | ICD-10-CM

## 2017-10-18 DIAGNOSIS — E039 Hypothyroidism, unspecified: Secondary | ICD-10-CM | POA: Diagnosis not present

## 2017-10-18 DIAGNOSIS — Z801 Family history of malignant neoplasm of trachea, bronchus and lung: Secondary | ICD-10-CM | POA: Diagnosis not present

## 2017-10-18 DIAGNOSIS — Z833 Family history of diabetes mellitus: Secondary | ICD-10-CM | POA: Diagnosis not present

## 2017-10-18 DIAGNOSIS — Z955 Presence of coronary angioplasty implant and graft: Secondary | ICD-10-CM | POA: Diagnosis not present

## 2017-10-18 DIAGNOSIS — E785 Hyperlipidemia, unspecified: Secondary | ICD-10-CM | POA: Diagnosis not present

## 2017-10-18 DIAGNOSIS — Z91048 Other nonmedicinal substance allergy status: Secondary | ICD-10-CM | POA: Diagnosis not present

## 2017-10-18 DIAGNOSIS — I739 Peripheral vascular disease, unspecified: Secondary | ICD-10-CM | POA: Diagnosis not present

## 2017-10-18 DIAGNOSIS — I2510 Atherosclerotic heart disease of native coronary artery without angina pectoris: Secondary | ICD-10-CM | POA: Diagnosis not present

## 2017-10-18 DIAGNOSIS — N028 Recurrent and persistent hematuria with other morphologic changes: Secondary | ICD-10-CM | POA: Diagnosis not present

## 2017-10-18 DIAGNOSIS — Z01810 Encounter for preprocedural cardiovascular examination: Secondary | ICD-10-CM | POA: Diagnosis not present

## 2017-10-18 DIAGNOSIS — Z885 Allergy status to narcotic agent status: Secondary | ICD-10-CM | POA: Diagnosis not present

## 2017-10-18 DIAGNOSIS — F419 Anxiety disorder, unspecified: Secondary | ICD-10-CM | POA: Diagnosis not present

## 2017-10-18 DIAGNOSIS — Z87442 Personal history of urinary calculi: Secondary | ICD-10-CM | POA: Diagnosis not present

## 2017-10-18 DIAGNOSIS — F329 Major depressive disorder, single episode, unspecified: Secondary | ICD-10-CM | POA: Diagnosis not present

## 2017-10-18 DIAGNOSIS — I493 Ventricular premature depolarization: Secondary | ICD-10-CM | POA: Diagnosis not present

## 2017-10-18 DIAGNOSIS — D649 Anemia, unspecified: Secondary | ICD-10-CM | POA: Diagnosis not present

## 2017-10-18 DIAGNOSIS — G4700 Insomnia, unspecified: Secondary | ICD-10-CM | POA: Diagnosis not present

## 2017-10-18 DIAGNOSIS — Z800 Family history of malignant neoplasm of digestive organs: Secondary | ICD-10-CM | POA: Diagnosis not present

## 2017-10-18 DIAGNOSIS — Z8249 Family history of ischemic heart disease and other diseases of the circulatory system: Secondary | ICD-10-CM | POA: Diagnosis not present

## 2017-10-18 DIAGNOSIS — I951 Orthostatic hypotension: Secondary | ICD-10-CM | POA: Diagnosis not present

## 2017-10-18 LAB — BASIC METABOLIC PANEL
Anion gap: 6 (ref 5–15)
BUN: 16 mg/dL (ref 8–23)
CALCIUM: 9.7 mg/dL (ref 8.9–10.3)
CO2: 28 mmol/L (ref 22–32)
CREATININE: 1.32 mg/dL — AB (ref 0.61–1.24)
Chloride: 109 mmol/L (ref 98–111)
GFR calc non Af Amer: 54 mL/min — ABNORMAL LOW (ref >60)
Glucose, Bld: 95 mg/dL (ref 70–99)
Potassium: 4.9 mmol/L (ref 3.5–5.1)
Sodium: 143 mmol/L (ref 135–145)

## 2017-10-18 NOTE — Patient Instructions (Signed)
Medication Instructions:  NO CHANGES- Your physician recommends that you continue on your current medications as directed. Please refer to the Current Medication list given to you today.  If you need a refill on your cardiac medications before your next appointment, please call your pharmacy.  Follow-Up: Your physician wants you to follow-up in: November 2019 as discussed previously discussed. You should receive a reminder letter in the mail two months in advance. If you do not receive a letter, please call our office Oct 2019 to schedule the Nov 2019 follow-up appointment.   Thank you for choosing [facility]!!

## 2017-10-18 NOTE — Progress Notes (Signed)
Cardiology Office Note   Date:  10/18/2017   ID:  Shawn Lam, DOB 1950-04-12, MRN [id number]  PCP:  Jinny Sanders, MD  Cardiologist: Dr.Turner  Chief Complaint  Patient presents with  . Pre-op Exam  . Coronary Artery Disease    PCI of LAD     History of Present Illness: Shawn Lam is a 67 y.o. male who presents for ongoing assessment and management of ASCAD wit PCI of the LAD and RCA, orthostatic hypotension, dyslipidemia. EF of 50-55% with inferior HK. He has hx of lymphedema, moderate OSA with AHI of 17/hr, on CPAP. He also has ILR in situ and has remote checks monthly. His next check is due 10/20/2017.   He is schedule for a drug induced endoscopy procedure with Dr. Redmond Baseman of ENT. He has already held Plavix for the last two days, and is to have procedure on 10/20/2017.   Past Medical History:  Diagnosis Date  . Anemia   . Anxiety   . Asymptomatic LV dysfunction    50-55% by echo 06/2016  . Berger's disease   . CAD (coronary artery disease) 06/2005   PCI of LAD and RCA  . Depression   . Dilated aortic root (HCC)    4cm at sinus of Valsalva  . Hematuria   . Hyperlipidemia   . Hypothyroidism   . Insomnia   . Left ureteral stone   . Orthostatic hypotension    negative tilt table  . OSA (obstructive sleep apnea)    moderate with AHI 17/hr, uses CPAP nightly  . PVC's (premature ventricular contractions) 07/07/2016  . Renal disorder    PT IS SEEING DR. Lorrene Reid- NEPHROLOGIST  . Rosacea   . Urticaria     Past Surgical History:  Procedure Laterality Date  . CARDIAC CATHETERIZATION  09/06/2013   [place]  . CARDIAC CATHETERIZATION    . CORONARY ANGIOPLASTY  2004   STENT PLACEMENT  . CYSTOSCOPY WITH RETROGRADE PYELOGRAM, URETEROSCOPY AND STENT PLACEMENT Left 03/19/2014   Procedure: CYSTOSCOPY WITH RETROGRADE PYELOGRAM, URETEROSCOPY AND STENT PLACEMENT;  Surgeon: Junious Dresser, MD;  Location: [facility];  Service: Urology;  Laterality: Left;  . CYSTOSCOPY WITH RETROGRADE  PYELOGRAM, URETEROSCOPY AND STENT PLACEMENT Bilateral 05/20/2015   Procedure:  CYSTOSCOPY WITH BILATERAL RETROGRADE PYELOGRAM,RIGHT  DIAGNOSTIC URETEROSCOPY ,LEFT URETEROSCOPY WITH HOLMIUM LASER  AND BILATERAL  STENT PLACEMENT ;  Surgeon: Alexis Frock, MD;  Location: [facility];  Service: Urology;  Laterality: Bilateral;  . EP IMPLANTABLE DEVICE N/A 05/13/2016   Procedure: Loop Recorder Insertion;  Surgeon: Deboraha Sprang, MD;  Location: [facility];  Service: Cardiovascular;  Laterality: N/A;  . HOLMIUM LASER APPLICATION Bilateral 09/12/4032   Procedure: HOLMIUM LASER APPLICATION;  Surgeon: Alexis Frock, MD;  Location: [facility];  Service: Urology;  Laterality: Bilateral;  . LEFT HEART CATHETERIZATION WITH CORONARY ANGIOGRAM N/A 09/06/2013   Procedure: LEFT HEART CATHETERIZATION WITH CORONARY ANGIOGRAM;  Surgeon: Sinclair Grooms, MD;  Location: [facility];  Service: Cardiovascular;  Laterality: N/A;  . LEFT KNEE CAP  SURGERY ABOUT 40 YRS AGO  ? 1970    . STONE EXTRACTION WITH BASKET Left 03/19/2014   Procedure: STONE EXTRACTION WITH BASKET;  Surgeon: Junious Dresser, MD;  Location: [facility];  Service: Urology;  Laterality: Left;     Current Outpatient Medications  Medication Sig Dispense Refill  . aspirin EC 81 MG tablet Take 81 mg by mouth at bedtime.     [name] atorvastatin (LIPITOR)  40 MG tablet TAKE 1 TABLET BY MOUTH DAILY. 90 tablet 3  . clopidogrel (PLAVIX) 75 MG tablet Take 1 tablet (75 mg total) by mouth daily. 90 tablet 3  . ferrous sulfate 325 (65 FE) MG tablet TAKE 1 TABLET BY MOUTH 3 TIMES DAILY WITH MEALS. 270 tablet 3  . fludrocortisone (FLORINEF) 0.1 MG tablet Take 1 tablet (0.1 mg total) by mouth daily. 30 tablet 11  . FLUOCINOLONE ACETONIDE BODY 0.01 % OIL Apply 1 application topically daily as needed (for dry skin).     [name] FLUoxetine (PROZAC) 40 MG capsule Take 1 capsule (40 mg total) by mouth every morning. 90 capsule 1  . furosemide (LASIX) 20 MG tablet TAKE 1 TABLET BY MOUTH  EVERY DAY 90 tablet 2  . levothyroxine (SYNTHROID, LEVOTHROID) 25 MCG tablet TAKE 1 TABLET DAILY BEFORE BREAKFAST 90 tablet 0  . midodrine (PROAMATINE) 5 MG tablet TAKE 1 TABLET (5 MG TOTAL) BY MOUTH 2 (TWO) TIMES DAILY WITH BREAKFAST AND LUNCH. 180 tablet 3  . mirtazapine (REMERON) 15 MG tablet Take 1 tablet (15 mg total) by mouth at bedtime. 90 tablet 1  . Multiple Vitamins-Minerals (CENTRUM ADULTS PO) Take 1 tablet by mouth every morning.     [name] NITROSTAT 0.4 MG SL tablet PLACE 1 TABLET (0.4 MG TOTAL) UNDER THE TONGUE EVERY 5 (FIVE) MINUTES AS NEEDED FOR CHEST PAIN. 25 tablet 5  . Teriparatide, Recombinant, (FORTEO) 600 MCG/2.4ML SOLN Inject 20 mcg daily into the skin.     [name] vitamin B-12 (CYANOCOBALAMIN) 1000 MCG tablet Take 1,000 mcg by mouth at bedtime.      No current facility-administered medications for this visit.     Allergies:   Codeine and Tape    Social History:  The patient  reports that he has never smoked. He has never used smokeless tobacco. He reports that he drinks about 0.6 oz of alcohol per week. He reports that he does not use drugs.   Family History:  The patient's family history includes Cancer in his father; Coronary artery disease in his mother; Diabetes in his brother; Heart attack in his mother; Heart disease in his mother; Hypertension in his mother; Lung cancer in his father.    ROS: All other systems are reviewed and negative. Unless otherwise mentioned in H&P    PHYSICAL EXAM: VS:  BP 112/72   Pulse 66   Ht 6' (1.829 m)   Wt 164 lb 9.6 oz (74.7 kg)   SpO2 98%   BMI 22.32 kg/m  , BMI Body mass index is 22.32 kg/m. GEN: Well nourished, well developed, in no acute distress  HEENT: normal  Neck: no JVD, carotid bruits, or masses Cardiac: RRR; no murmurs, rubs, or gallops,no edema  Respiratory:  Clear to auscultation bilaterally, normal work of breathing GI: soft, nontender, nondistended, + BS MS: no deformity or atrophy  Skin: warm and dry, no  rash Neuro:  Strength and sensation are intact Psych: euthymic mood, full affect   EKG: Not completed this office visit.   Recent Labs: 11/30/2016: TSH 5.79 02/06/2017: ALT 15; BUN 15; Creatinine, Ser 1.26; Potassium 4.4; Sodium 144    Lipid Panel    Component Value Date/Time   CHOL 155 02/06/2017 0931   CHOL 145 07/19/2016 0853   TRIG 46.0 02/06/2017 0931   HDL 74.60 02/06/2017 0931   HDL 70 07/19/2016 0853   CHOLHDL 2 02/06/2017 0931   VLDL 9.2 02/06/2017 0931   LDLCALC 72 02/06/2017 0931   LDLCALC 63  07/19/2016 0853   LDLDIRECT 57 06/22/2009 1228      Wt Readings from Last 3 Encounters:  10/18/17 164 lb 9.6 oz (74.7 kg)  02/22/17 174 lb (78.9 kg)  02/10/17 175 lb 8 oz (79.6 kg)    Other studies Reviewed: Echocardiogram 02-Aug-2017  Left ventricle: The cavity size was normal. Wall thickness was   increased in a pattern of mild LVH. Systolic function was normal.   The estimated ejection fraction was in the range of 60% to 65%.   Wall motion was normal; there were no regional wall motion   abnormalities. Doppler parameters are consistent with abnormal   left ventricular relaxation (grade 1 diastolic dysfunction). The   E/e&' ratio is <8, suggesting normal LV filling pressure. - Aorta: The sinus of valsalva is dilated at 4.0 cm. - Left atrium: The atrium was normal in size. - Right atrium: The atrium was at the upper limits of normal in   size. - Tricuspid valve: There was trivial regurgitation. - Pulmonary arteries: PA peak pressure: 22 mm Hg (S). - Inferior vena cava: The vessel was normal in size. The   respirophasic diameter changes were in the normal range (= 50%),   consistent with normal central venous pressure.  Impressions:  - Compared to a prior study in 06/2016, the LVEF is higher at   60-65%. The aorta is dilated to 4.0 cm at the sinus of valsalva,   but measures 3.5 cm at the aortic root.  ASSESSMENT AND PLAN:  1.  Pre-Operative Evaluation:   Chart reviewed as part of pre-operative protocol coverage. Given past medical history and time since last visit, based on ACC/AHA guidelines, Shawn Lam would be at acceptable risk for the planned procedure without further cardiovascular testing. Plavix is to be held for 5 days. He has already held it for two days prior to being seen today.   2. CAD: Hx of DES to RCA. No complaints of chest pain, dyspnea on exertion or fatigue. No bleeding on antiplatelet medications. Continue ASAP after procedure.   3. OSA: Followed by Dr. Radford Pax.Continues on CPAP.   4.Dyslipidemia: Continue atorvastatin 40 mg daily.    Current medicines are reviewed at length with the patient today.    Labs/ tests ordered today include: None  Phill Myron. West Pugh, ANP, AACC   10/18/2017 1:45 PM    [facility] [address], [place],  [address] Phone: [phone]; Fax: [phone]

## 2017-10-18 NOTE — Progress Notes (Signed)
EKG reviewed by Dr. Gifford Shave, will proceed with surgery as scheduled.

## 2017-10-20 ENCOUNTER — Encounter (HOSPITAL_BASED_OUTPATIENT_CLINIC_OR_DEPARTMENT_OTHER)
Admission: RE | Disposition: A | Discharge status: Home / Self Care | Payer: Self-pay | Source: Ambulatory Visit | Attending: Otolaryngology

## 2017-10-20 ENCOUNTER — Ambulatory Visit (HOSPITAL_BASED_OUTPATIENT_CLINIC_OR_DEPARTMENT_OTHER): Payer: Medicare Other | Admitting: Anesthesiology

## 2017-10-20 ENCOUNTER — Ambulatory Visit (INDEPENDENT_AMBULATORY_CARE_PROVIDER_SITE_OTHER): Payer: Medicare Other | Admitting: *Deleted

## 2017-10-20 ENCOUNTER — Other Ambulatory Visit: Payer: Self-pay

## 2017-10-20 ENCOUNTER — Encounter (HOSPITAL_BASED_OUTPATIENT_CLINIC_OR_DEPARTMENT_OTHER): Payer: Self-pay

## 2017-10-20 ENCOUNTER — Ambulatory Visit (HOSPITAL_BASED_OUTPATIENT_CLINIC_OR_DEPARTMENT_OTHER)
Admission: RE | Admit: 2017-10-20 | Discharge: 2017-10-20 | Disposition: A | Discharge status: Home / Self Care | Payer: Medicare Other | Source: Ambulatory Visit | Attending: Otolaryngology | Admitting: Otolaryngology

## 2017-10-20 DIAGNOSIS — F419 Anxiety disorder, unspecified: Secondary | ICD-10-CM | POA: Insufficient documentation

## 2017-10-20 DIAGNOSIS — G4700 Insomnia, unspecified: Secondary | ICD-10-CM | POA: Insufficient documentation

## 2017-10-20 DIAGNOSIS — D649 Anemia, unspecified: Secondary | ICD-10-CM | POA: Insufficient documentation

## 2017-10-20 DIAGNOSIS — I951 Orthostatic hypotension: Secondary | ICD-10-CM | POA: Diagnosis not present

## 2017-10-20 DIAGNOSIS — I493 Ventricular premature depolarization: Secondary | ICD-10-CM | POA: Insufficient documentation

## 2017-10-20 DIAGNOSIS — Z87442 Personal history of urinary calculi: Secondary | ICD-10-CM | POA: Diagnosis not present

## 2017-10-20 DIAGNOSIS — E039 Hypothyroidism, unspecified: Secondary | ICD-10-CM | POA: Insufficient documentation

## 2017-10-20 DIAGNOSIS — Z800 Family history of malignant neoplasm of digestive organs: Secondary | ICD-10-CM | POA: Insufficient documentation

## 2017-10-20 DIAGNOSIS — N028 Recurrent and persistent hematuria with other morphologic changes: Secondary | ICD-10-CM | POA: Insufficient documentation

## 2017-10-20 DIAGNOSIS — Z91048 Other nonmedicinal substance allergy status: Secondary | ICD-10-CM | POA: Insufficient documentation

## 2017-10-20 DIAGNOSIS — I739 Peripheral vascular disease, unspecified: Secondary | ICD-10-CM | POA: Insufficient documentation

## 2017-10-20 DIAGNOSIS — K219 Gastro-esophageal reflux disease without esophagitis: Secondary | ICD-10-CM | POA: Diagnosis not present

## 2017-10-20 DIAGNOSIS — Z801 Family history of malignant neoplasm of trachea, bronchus and lung: Secondary | ICD-10-CM | POA: Insufficient documentation

## 2017-10-20 DIAGNOSIS — F329 Major depressive disorder, single episode, unspecified: Secondary | ICD-10-CM | POA: Diagnosis not present

## 2017-10-20 DIAGNOSIS — E785 Hyperlipidemia, unspecified: Secondary | ICD-10-CM | POA: Insufficient documentation

## 2017-10-20 DIAGNOSIS — G4733 Obstructive sleep apnea (adult) (pediatric): Secondary | ICD-10-CM | POA: Insufficient documentation

## 2017-10-20 DIAGNOSIS — Z955 Presence of coronary angioplasty implant and graft: Secondary | ICD-10-CM | POA: Insufficient documentation

## 2017-10-20 DIAGNOSIS — Z885 Allergy status to narcotic agent status: Secondary | ICD-10-CM | POA: Insufficient documentation

## 2017-10-20 DIAGNOSIS — L719 Rosacea, unspecified: Secondary | ICD-10-CM | POA: Insufficient documentation

## 2017-10-20 DIAGNOSIS — Z833 Family history of diabetes mellitus: Secondary | ICD-10-CM | POA: Insufficient documentation

## 2017-10-20 DIAGNOSIS — I2510 Atherosclerotic heart disease of native coronary artery without angina pectoris: Secondary | ICD-10-CM | POA: Insufficient documentation

## 2017-10-20 DIAGNOSIS — R002 Palpitations: Secondary | ICD-10-CM

## 2017-10-20 DIAGNOSIS — Z8249 Family history of ischemic heart disease and other diseases of the circulatory system: Secondary | ICD-10-CM | POA: Insufficient documentation

## 2017-10-20 HISTORY — DX: Hypothyroidism, unspecified: E03.9

## 2017-10-20 HISTORY — PX: DRUG INDUCED ENDOSCOPY: SHX6808

## 2017-10-20 SURGERY — DRUG INDUCED SLEEP ENDOSCOPY
Anesthesia: Monitor Anesthesia Care | Site: Throat

## 2017-10-20 MED ORDER — LACTATED RINGERS IV SOLN
INTRAVENOUS | Status: DC
  Administered 2017-10-20: 10:00:00 via INTRAVENOUS

## 2017-10-20 MED ORDER — OXYMETAZOLINE HCL 0.05 % NA SOLN
NASAL | Status: DC | PRN
  Administered 2017-10-20: 1 via TOPICAL

## 2017-10-20 MED ORDER — MIDAZOLAM HCL 2 MG/2ML IJ SOLN: 1.0000 mg | INTRAMUSCULAR | Status: DC | PRN

## 2017-10-20 MED ORDER — OXYMETAZOLINE HCL 0.05 % NA SOLN
NASAL | Status: AC
  Filled 2017-10-20: qty 15

## 2017-10-20 MED ORDER — GLYCOPYRROLATE 0.2 MG/ML IJ SOLN
INTRAMUSCULAR | Status: DC | PRN
  Administered 2017-10-20: 0.1 mg via INTRAVENOUS

## 2017-10-20 MED ORDER — PROPOFOL 500 MG/50ML IV EMUL
INTRAVENOUS | Status: DC | PRN
  Administered 2017-10-20: 50 ug/kg/min via INTRAVENOUS

## 2017-10-20 MED ORDER — FENTANYL CITRATE (PF) 100 MCG/2ML IJ SOLN: 50.0000 ug | INTRAMUSCULAR | Status: DC | PRN

## 2017-10-20 MED ORDER — SCOPOLAMINE 1 MG/3DAYS TD PT72: 1.0000 | MEDICATED_PATCH | Freq: Once | TRANSDERMAL | Status: DC | PRN

## 2017-10-20 MED ORDER — PROPOFOL 10 MG/ML IV BOLUS
INTRAVENOUS | Status: DC | PRN
  Administered 2017-10-20 (×2): 10 mg via INTRAVENOUS

## 2017-10-20 SURGICAL SUPPLY — 12 items
CANISTER SUCT 1200ML W/VALVE (MISCELLANEOUS) IMPLANT
GLOVE BIO SURGEON STRL SZ7.5 (GLOVE) ×3 IMPLANT
GLOVE SURG SS PI 7.0 STRL IVOR (GLOVE) ×6 IMPLANT
NEEDLE PRECISIONGLIDE 27X1.5 (NEEDLE) IMPLANT
PACK BASIN DAY SURGERY FS (CUSTOM PROCEDURE TRAY) ×3 IMPLANT
PATTIES SURGICAL .5 X3 (DISPOSABLE) ×3 IMPLANT
SHEET MEDIUM DRAPE 40X70 STRL (DRAPES) IMPLANT
SOLUTION ANTI FOG 6CC (MISCELLANEOUS) ×3 IMPLANT
SYR CONTROL 10ML LL (SYRINGE) IMPLANT
TOWEL GREEN STERILE FF (TOWEL DISPOSABLE) ×3 IMPLANT
TUBE CONNECTING 20'X1/4 (TUBING)
TUBE CONNECTING 20X1/4 (TUBING) IMPLANT

## 2017-10-20 NOTE — H&P (Signed)
Shawn Lam is an 67 y.o. male.   Chief Complaint: Sleep apnea HPI: 67 year old with obstructive sleep apnea unable to tolerate CPAP.  He presents for drug induced sleep endoscopy.  Past Medical History:  Diagnosis Date  . Anemia   . Anxiety   . Asymptomatic LV dysfunction    50-55% by echo 06/2016  . Berger's disease   . CAD (coronary artery disease) 06/2005   PCI of LAD and RCA  . Depression   . Dilated aortic root (HCC)    4cm at sinus of Valsalva  . Hematuria   . Hyperlipidemia   . Hypothyroidism   . Insomnia   . Left ureteral stone   . Orthostatic hypotension    negative tilt table  . OSA (obstructive sleep apnea)    moderate with AHI 17/hr, uses CPAP nightly  . PVC's (premature ventricular contractions) 07/07/2016  . Renal disorder    PT IS SEEING DR. Lorrene Reid- NEPHROLOGIST  . Rosacea   . Urticaria     Past Surgical History:  Procedure Laterality Date  . CARDIAC CATHETERIZATION  09/06/2013   [place]  . CARDIAC CATHETERIZATION    . CORONARY ANGIOPLASTY  2004   STENT PLACEMENT  . CYSTOSCOPY WITH RETROGRADE PYELOGRAM, URETEROSCOPY AND STENT PLACEMENT Left 03/19/2014   Procedure: CYSTOSCOPY WITH RETROGRADE PYELOGRAM, URETEROSCOPY AND STENT PLACEMENT;  Surgeon: Junious Dresser, MD;  [facility]: [facility];  Service: Urology;  Laterality: Left;  . CYSTOSCOPY WITH RETROGRADE PYELOGRAM, URETEROSCOPY AND STENT PLACEMENT Bilateral 05/20/2015   Procedure:  CYSTOSCOPY WITH BILATERAL RETROGRADE PYELOGRAM,RIGHT  DIAGNOSTIC URETEROSCOPY ,LEFT URETEROSCOPY WITH HOLMIUM LASER  AND BILATERAL  STENT PLACEMENT ;  Surgeon: Alexis Frock, MD;  Location: [facility];  Service: Urology;  Laterality: Bilateral;  . EP IMPLANTABLE DEVICE N/A 05/13/2016   Procedure: Loop Recorder Insertion;  Surgeon: Deboraha Sprang, MD;  Location: [place] [facility];  Service: Cardiovascular;  Laterality: N/A;  . HOLMIUM LASER APPLICATION Bilateral 8/0/0349   Procedure: HOLMIUM LASER APPLICATION;  Surgeon: Alexis Frock,  MD;  Location: [facility];  Service: Urology;  Laterality: Bilateral;  . LEFT HEART CATHETERIZATION WITH CORONARY ANGIOGRAM N/A 09/06/2013   Procedure: LEFT HEART CATHETERIZATION WITH CORONARY ANGIOGRAM;  Surgeon: Sinclair Grooms, MD;  [facility];  Service: Cardiovascular;  Laterality: N/A;  . LEFT KNEE CAP  SURGERY ABOUT 40 YRS AGO  ? 1970    . STONE EXTRACTION WITH BASKET Left 03/19/2014   Procedure: STONE EXTRACTION WITH BASKET;  Surgeon: Junious Dresser, MD;  Location: [facility];  Service: Urology;  Laterality: Left;    Family History  Problem Relation Age of Onset  . Coronary artery disease Mother   . Heart attack Mother   . Heart disease Mother   . Hypertension Mother   . Cancer Father        lung and colon  . Lung cancer Father        smoker  . Diabetes Brother    Social History:  reports that he has never smoked. He has never used smokeless tobacco. He reports that he drinks about 0.6 oz of alcohol per week. He reports that he does not use drugs.  Allergies:  Allergies  Allergen Reactions  . Codeine Nausea Only  . Tape Other (See Comments)    Use only paper tape. Severe blistering and bruising with other tapes. No cloth tape.    Medications Prior to Admission  Medication Sig Dispense Refill  . aspirin EC  81 MG tablet Take 81 mg by mouth at bedtime.     [name] atorvastatin (LIPITOR) 40 MG tablet TAKE 1 TABLET BY MOUTH DAILY. 90 tablet 3  . clopidogrel (PLAVIX) 75 MG tablet Take 1 tablet (75 mg total) by mouth daily. 90 tablet 3  . ferrous sulfate 325 (65 FE) MG tablet TAKE 1 TABLET BY MOUTH 3 TIMES DAILY WITH MEALS. 270 tablet 3  . fludrocortisone (FLORINEF) 0.1 MG tablet Take 1 tablet (0.1 mg total) by mouth daily. 30 tablet 11  . FLUoxetine (PROZAC) 40 MG capsule Take 1 capsule (40 mg total) by mouth every morning. 90 capsule 1  . furosemide (LASIX) 20 MG tablet TAKE 1 TABLET BY MOUTH EVERY DAY 90 tablet 2  . levothyroxine (SYNTHROID, LEVOTHROID) 25 MCG tablet TAKE  1 TABLET DAILY BEFORE BREAKFAST 90 tablet 0  . midodrine (PROAMATINE) 5 MG tablet TAKE 1 TABLET (5 MG TOTAL) BY MOUTH 2 (TWO) TIMES DAILY WITH BREAKFAST AND LUNCH. 180 tablet 3  . mirtazapine (REMERON) 15 MG tablet Take 1 tablet (15 mg total) by mouth at bedtime. 90 tablet 1  . Multiple Vitamins-Minerals (CENTRUM ADULTS PO) Take 1 tablet by mouth every morning.     . Teriparatide, Recombinant, (FORTEO) 600 MCG/2.4ML SOLN Inject 20 mcg daily into the skin.     [name] vitamin B-12 (CYANOCOBALAMIN) 1000 MCG tablet Take 1,000 mcg by mouth at bedtime.     [name] FLUOCINOLONE ACETONIDE BODY 0.01 % OIL Apply 1 application topically daily as needed (for dry skin).     [name] NITROSTAT 0.4 MG SL tablet PLACE 1 TABLET (0.4 MG TOTAL) UNDER THE TONGUE EVERY 5 (FIVE) MINUTES AS NEEDED FOR CHEST PAIN. 25 tablet 5    Results for orders placed or performed during the hospital encounter of 10/20/17 (from the past 48 hour(s))  Basic metabolic panel     Status: Abnormal   Collection Time: 10/18/17  1:00 PM  Result Value Ref Range   Sodium 143 135 - 145 mmol/L   Potassium 4.9 3.5 - 5.1 mmol/L   Chloride 109 98 - 111 mmol/L    Comment: Please note change in reference range.   CO2 28 22 - 32 mmol/L   Glucose, Bld 95 70 - 99 mg/dL    Comment: Please note change in reference range.   BUN 16 8 - 23 mg/dL    Comment: Please note change in reference range.   Creatinine, Ser 1.32 (H) 0.61 - 1.24 mg/dL   Calcium 9.7 8.9 - 10.3 mg/dL   GFR calc non Af Amer 54 (L) >60 mL/min   GFR calc Af Amer >60 >60 mL/min    Comment: (NOTE) The eGFR has been calculated using the CKD EPI equation. This calculation has not been validated in all clinical situations. eGFR's persistently <60 mL/min signify possible Chronic Kidney Disease.    Anion gap 6 5 - 15    Comment: Performed at [facility] [address] [place], [place] [address]   No results found.  Review of Systems  All other systems reviewed and are negative.   Blood  pressure (!) 144/70, pulse (!) 59, temperature 97.9 F (36.6 C), temperature source Oral, resp. rate 16, height 6' (1.829 m), weight 164 lb (74.4 kg), SpO2 99 %. Physical Exam  Constitutional: He is oriented to person, place, and time. He appears well-developed and well-nourished. No distress.  HENT:  Head: Normocephalic and atraumatic.  Right Ear: External ear normal.  Left Ear: External ear normal.  Nose: Nose normal.  Mouth/Throat: Oropharynx is clear and moist.  Tonsils 1+.  Friedman class 1.  Eyes: Pupils are equal, round, and reactive to light. Conjunctivae and EOM are normal.  Neck: Normal range of motion. Neck supple.  Cardiovascular: Normal rate.  Respiratory: Effort normal.  Musculoskeletal: Normal range of motion.  Neurological: He is alert and oriented to person, place, and time. No cranial nerve deficit.  Skin: Skin is warm and dry.  Psychiatric: He has a normal mood and affect. His behavior is normal. Judgment and thought content normal.     Assessment/Plan Obstructive sleep apnea To OR for DISE.  Melida Quitter, MD 10/20/2017, 10:12 AM

## 2017-10-20 NOTE — Anesthesia Preprocedure Evaluation (Signed)
Anesthesia Evaluation  Patient identified by MRN, date of birth, ID band Patient awake    Reviewed: Allergy & Precautions, NPO status , Patient's Chart, lab work & pertinent test results  Airway Mallampati: II  TM Distance: >3 FB Neck ROM: Full    Dental no notable dental hx.    Pulmonary sleep apnea and Continuous Positive Airway Pressure Ventilation ,    Pulmonary exam normal breath sounds clear to auscultation       Cardiovascular + CAD and + Peripheral Vascular Disease  Normal cardiovascular exam Rhythm:Regular Rate:Normal     Neuro/Psych negative neurological ROS  negative psych ROS   GI/Hepatic negative GI ROS, Neg liver ROS,   Endo/Other  negative endocrine ROS  Renal/GU negative Renal ROS  negative genitourinary   Musculoskeletal negative musculoskeletal ROS (+)   Abdominal   Peds negative pediatric ROS (+)  Hematology negative hematology ROS (+)   Anesthesia Other Findings   Reproductive/Obstetrics negative OB ROS                             Anesthesia Physical Anesthesia Plan  ASA: III  Anesthesia Plan: MAC   Post-op Pain Management:    Induction: Intravenous  PONV Risk Score and Plan: 0  Airway Management Planned: Simple Face Mask  Additional Equipment:   Intra-op Plan:   Post-operative Plan:   Informed Consent: I have reviewed the patients History and Physical, chart, labs and discussed the procedure including the risks, benefits and alternatives for the proposed anesthesia with the patient or authorized representative who has indicated his/her understanding and acceptance.   Dental advisory given  Plan Discussed with: CRNA and Surgeon  Anesthesia Plan Comments:         Anesthesia Quick Evaluation

## 2017-10-20 NOTE — Discharge Instructions (Signed)

## 2017-10-20 NOTE — Anesthesia Postprocedure Evaluation (Signed)
Anesthesia Post Note  Patient: Shawn Lam  Procedure(s) Performed: DRUG INDUCED SLEEP ENDOSCOPY (N/A Throat)     Patient location during evaluation: PACU Anesthesia Type: MAC Level of consciousness: awake and alert Pain management: pain level controlled Vital Signs Assessment: post-procedure vital signs reviewed and stable Respiratory status: spontaneous breathing, nonlabored ventilation, respiratory function stable and patient connected to nasal cannula oxygen Cardiovascular status: stable and blood pressure returned to baseline Postop Assessment: no apparent nausea or vomiting Anesthetic complications: no    Last Vitals:  Vitals:   10/20/17 1100 10/20/17 1123  BP: 128/79 131/89  Pulse: 66 (!) 58  Resp: 17 16  Temp:  36.6 C  SpO2: 99% 99%    Last Pain:  Vitals:   10/20/17 1123  TempSrc:   PainSc: 0-No pain                 Jesstin Studstill S

## 2017-10-20 NOTE — Op Note (Signed)
NAME: Shawn Lam, Shawn Lam. MEDICAL RECORD [id number] ACCOUNT [id number] DATE OF BIRTH:08-10-1950 FACILITY: [facility] LOCATION: [facility] PHYSICIAN:Irlene Crudup Guido Sander, MD  OPERATIVE REPORT  DATE OF PROCEDURE:  10/20/2017  PREOPERATIVE DIAGNOSIS:  Obstructive sleep apnea.  POSTOPERATIVE DIAGNOSIS:  Obstructive sleep apnea.  PROCEDURE PERFORMED:  Drug-induced sleep endoscopy.  SURGEON:  Melida Quitter, MD.  ANESTHESIA:  IV sedation.  COMPLICATIONS:  None.  INDICATIONS:  The patient is a 67 year old male with moderate obstructive sleep apnea who has been unable to tolerate CPAP.  He presents to the operating room for airway evaluation.  FINDINGS:  The nasal anatomy was normal.  During sleep, he has a complete anterior to posterior oriented obstruction at the velopharynx, tongue base and epiglottis.  DESCRIPTION OF PROCEDURE:  The patient was identified in the holding room, informed consent having been obtained with discussion of risks, benefits, and alternatives.  The patient was brought to the operative suite and put the operative table in supine  position.  IV sedation was induced and the patient was slowly advanced until he reached a natural sleep state.  The right nasal passages were packed with Afrin pledgets for several minutes, which were then removed.  A flexible laryngoscope was then  passed through the right nasal passage to view the airway.  Findings noted above.  After this was completed, the patient was awoken and moved to the recovery room in stable condition.  TN/NUANCE  D:10/20/2017 T:10/20/2017 JOB:[id number]

## 2017-10-20 NOTE — Brief Op Note (Signed)
10/20/2017  10:46 AM  PATIENT:  Shawn Lam  67 y.o. male  PRE-OPERATIVE DIAGNOSIS:  sleep apnea  POST-OPERATIVE DIAGNOSIS:  sleep apnea  PROCEDURE:  Procedure(s): DRUG INDUCED SLEEP ENDOSCOPY (N/A)  SURGEON:  Surgeon(s) and Role:    Melida Quitter, MD - Primary  PHYSICIAN ASSISTANT:   ASSISTANTS: none   ANESTHESIA:   IV sedation  EBL:  None   BLOOD ADMINISTERED:none  DRAINS: none   LOCAL MEDICATIONS USED:  NONE  SPECIMEN:  No Specimen  DISPOSITION OF SPECIMEN:  N/A  COUNTS:  YES  TOURNIQUET:  * No tourniquets in log *  DICTATION: .Other Dictation: Dictation Number [id number]  PLAN OF CARE: Discharge to home after PACU  PATIENT DISPOSITION:  PACU - hemodynamically stable.   Delay start of Pharmacological VTE agent (>24hrs) due to surgical blood loss or risk of bleeding: no

## 2017-10-20 NOTE — Transfer of Care (Signed)
Immediate Anesthesia Transfer of Care Note  Patient: Shawn Lam  Procedure(s) Performed: DRUG INDUCED SLEEP ENDOSCOPY (N/A Throat)  Patient Location: PACU  Anesthesia Type:MAC  Level of Consciousness: awake, alert  and oriented  Airway & Oxygen Therapy: Patient Spontanous Breathing  Post-op Assessment: Report given to RN and Post -op Vital signs reviewed and stable  Post vital signs: Reviewed and stable  Last Vitals:  Vitals Value Taken Time  BP    Temp    Pulse 61 10/20/2017 10:48 AM  Resp    SpO2 99 % 10/20/2017 10:48 AM  Vitals shown include unvalidated device data.  Last Pain:  Vitals:   10/20/17 0940  TempSrc: Oral  PainSc:          Complications: No apparent anesthesia complications

## 2017-10-23 ENCOUNTER — Encounter (HOSPITAL_BASED_OUTPATIENT_CLINIC_OR_DEPARTMENT_OTHER): Payer: Self-pay | Admitting: Otolaryngology

## 2017-10-23 NOTE — Progress Notes (Signed)
[facility] Summary Report / Loop Recorder 

## 2017-10-24 LAB — CUP PACEART REMOTE DEVICE CHECK
Date Time Interrogation Session: 20190610020516
MDC IDC PG IMPLANT DT: 20180202

## 2017-11-16 DIAGNOSIS — M4854XA Collapsed vertebra, not elsewhere classified, thoracic region, initial encounter for fracture: Secondary | ICD-10-CM | POA: Diagnosis not present

## 2017-11-17 ENCOUNTER — Other Ambulatory Visit: Payer: Self-pay | Admitting: Otolaryngology

## 2017-11-20 DIAGNOSIS — M4854XA Collapsed vertebra, not elsewhere classified, thoracic region, initial encounter for fracture: Secondary | ICD-10-CM | POA: Diagnosis not present

## 2017-11-22 ENCOUNTER — Ambulatory Visit (INDEPENDENT_AMBULATORY_CARE_PROVIDER_SITE_OTHER): Payer: Medicare Other | Admitting: *Deleted

## 2017-11-22 DIAGNOSIS — R002 Palpitations: Secondary | ICD-10-CM | POA: Diagnosis not present

## 2017-11-23 NOTE — Progress Notes (Signed)
[facility] Summary Report / Loop Recorder 

## 2017-11-28 ENCOUNTER — Other Ambulatory Visit: Payer: Self-pay | Admitting: Family Medicine

## 2017-11-28 MED ORDER — FLUOXETINE HCL 40 MG PO CAPS: 40.0000 mg | ORAL_CAPSULE | Freq: Every morning | ORAL | 0 refills | Status: DC

## 2017-11-28 MED ORDER — FLUOXETINE HCL 20 MG PO CAPS: 20.0000 mg | ORAL_CAPSULE | Freq: Every day | ORAL | 0 refills | Status: DC

## 2017-11-28 NOTE — Telephone Encounter (Signed)
Received faxed refill request. Medication have not been prescribed by Dr Diona Browner. Last OV on 02/10/2017

## 2017-11-28 NOTE — Telephone Encounter (Addendum)
This is a Pharmacist, community message sent by Mr. Koppel on 06/12/2017:  At my last visit I asked if you could write me prescription 20 mg and 40 mg Fluoxetine that was prescribed by Dr Gretel Acre which I am no longer seeing.   Looks like he probably needs a refill on the 40 mg as well.  Both medications were last refilled by [place] on 06/12/2017 for #90 with 1 refill.  [place] scheduled 02/27/2018.  Ok to refill?

## 2017-11-28 NOTE — Telephone Encounter (Signed)
Left message for [name] to return call in regards to medication refill.  [place] for [facility] Triage Nurse to talk with patient when he calls  for clarification on his Prozac refill.

## 2017-11-28 NOTE — Telephone Encounter (Signed)
Call pt.. Is he still taking this? Seeing psychiatry? If so Psych needs to fill.  If not  Seeing psych.. Is he also taking 40 mg dtab to take with it?

## 2017-11-30 DIAGNOSIS — M4854XD Collapsed vertebra, not elsewhere classified, thoracic region, subsequent encounter for fracture with routine healing: Secondary | ICD-10-CM | POA: Diagnosis not present

## 2017-11-30 LAB — CUP PACEART REMOTE DEVICE CHECK
Date Time Interrogation Session: 20190713020631
MDC IDC PG IMPLANT DT: 20180202

## 2017-11-30 NOTE — Pre-Procedure Instructions (Signed)
Shawn Lam  11/30/2017      [id number] Lorina Rabon, [place] [address] [place] [address] Phone: [phone] Fax: [phone]  [facility] [place] [place] [facility] [address] [place] [address] Phone: [phone] Fax: [phone]    Your procedure is scheduled on December 08, 2017.  Report to [facility] Admitting at 645 AM.  Call this number if you have problems the morning of surgery:  [phone]   Remember:  Do not eat or drink after midnight.    Take these medicines the morning of surgery with A SIP OF WATER  Fluoxetine (prozac) Levothyroxine (synthroid) Midodrine (proamatine) Tramadol (ultram)-if needed Nitrostat-if needed for chest pain  Follow your surgeon's instructions on when to hold/resume aspirin and plavix.  If no instructions were given call the office to determine how they would like to you take aspirin and plavix  7 days prior to surgery STOP taking any dicofenac (voltaren), Aspirin (unless otherwise instructed by your surgeon), Aleve, Naproxen, Ibuprofen, Motrin, Advil, Goody's, BC's, all herbal medications, fish oil, and all vitamins   Do not wear jewelry  Do not wear lotions, powders, or colognes, or deodorant.  Men may shave face and neck.  Do not bring valuables to the hospital.  [facility] is not responsible for any belongings or valuables.  Contacts, dentures or bridgework may not be worn into surgery.  Leave your suitcase in the car.  After surgery it may be brought to your room.  For patients admitted to the hospital, discharge time will be determined by your treatment team.  Patients discharged the day of surgery will not be allowed to drive home.    [facility]- Preparing For Surgery  Before surgery, you can play an important role. Because skin is not sterile, your skin needs to be as free of germs as  possible. You can reduce the number of germs on your skin by washing with CHG (chlorahexidine gluconate) Soap before surgery.  CHG is an antiseptic cleaner which kills germs and bonds with the skin to continue killing germs even after washing.    Oral Hygiene is also important to reduce your risk of infection.  Remember - BRUSH YOUR TEETH THE MORNING OF SURGERY WITH YOUR REGULAR TOOTHPASTE  Please do not use if you have an allergy to CHG or antibacterial soaps. If your skin becomes reddened/irritated stop using the CHG.  Do not shave (including legs and underarms) for at least 48 hours prior to first CHG shower. It is OK to shave your face.  Please follow these instructions carefully.   1. Shower the NIGHT BEFORE SURGERY and the MORNING OF SURGERY with CHG.   2. If you chose to wash your hair, wash your hair first as usual with your normal shampoo.  3. After you shampoo, rinse your hair and body thoroughly to remove the shampoo.  4. Use CHG as you would any other liquid soap. You can apply CHG directly to the skin and wash gently with a scrungie or a clean washcloth.   5. Apply the CHG Soap to your body ONLY FROM THE NECK DOWN.  Do not use on open wounds or open sores. Avoid contact with your eyes, ears, mouth and genitals (private parts). Wash Face and genitals (private parts)  with your normal soap.  6. Wash thoroughly, paying special attention to the area where your surgery  will be performed.  7. Thoroughly rinse your body with warm water from the neck down.  8. DO NOT shower/wash with your normal soap after using and rinsing off the CHG Soap.  9. Pat yourself dry with a CLEAN TOWEL.  10. Wear CLEAN PAJAMAS to bed the night before surgery, wear comfortable clothes the morning of surgery  11. Place CLEAN SHEETS on your bed the night of your first shower and DO NOT SLEEP WITH PETS.  Day of Surgery:  Do not apply any deodorants/lotions.  Please wear clean clothes to the  hospital/surgery center.   Remember to brush your teeth WITH YOUR REGULAR TOOTHPASTE.  Please read over the following fact sheets that you were given. Pain Booklet, Coughing and Deep Breathing and Surgical Site Infection Prevention

## 2017-12-01 ENCOUNTER — Encounter (HOSPITAL_COMMUNITY): Payer: Self-pay

## 2017-12-01 ENCOUNTER — Encounter (HOSPITAL_COMMUNITY)
Admission: RE | Admit: 2017-12-01 | Discharge: 2017-12-01 | Disposition: A | Discharge status: Home / Self Care | Payer: Medicare Other | Source: Ambulatory Visit | Attending: Otolaryngology | Admitting: Otolaryngology

## 2017-12-01 ENCOUNTER — Other Ambulatory Visit: Payer: Self-pay

## 2017-12-01 DIAGNOSIS — Z01812 Encounter for preprocedural laboratory examination: Secondary | ICD-10-CM | POA: Insufficient documentation

## 2017-12-01 LAB — CBC
HEMATOCRIT: 43.6 % (ref 39.0–52.0)
HEMOGLOBIN: 13.8 g/dL (ref 13.0–17.0)
MCH: 32.8 pg (ref 26.0–34.0)
MCHC: 31.7 g/dL (ref 30.0–36.0)
MCV: 103.6 fL — ABNORMAL HIGH (ref 78.0–100.0)
Platelets: 185 10*3/uL (ref 150–400)
RBC: 4.21 MIL/uL — AB (ref 4.22–5.81)
RDW: 13.7 % (ref 11.5–15.5)
WBC: 6.9 10*3/uL (ref 4.0–10.5)

## 2017-12-01 LAB — BASIC METABOLIC PANEL
ANION GAP: 6 (ref 5–15)
BUN: 21 mg/dL (ref 8–23)
CALCIUM: 9.4 mg/dL (ref 8.9–10.3)
CHLORIDE: 108 mmol/L (ref 98–111)
CO2: 29 mmol/L (ref 22–32)
Creatinine, Ser: 1.44 mg/dL — ABNORMAL HIGH (ref 0.61–1.24)
GFR calc Af Amer: 57 mL/min — ABNORMAL LOW (ref >60)
GFR calc non Af Amer: 49 mL/min — ABNORMAL LOW (ref >60)
Glucose, Bld: 90 mg/dL (ref 70–99)
POTASSIUM: 4.7 mmol/L (ref 3.5–5.1)
Sodium: 143 mmol/L (ref 135–145)

## 2017-12-01 NOTE — Progress Notes (Signed)
PCP - [name] Cardiologist - Fransico Him Nephrologist- Dr. Lorrene Reid  EKG - 10/18/17 Stress Test - 02/2016 ECHO - 06/2016 Cardiac Cath - 2015  Sleep Study - sleep study 05/04/16  Blood Thinner Instructions: Pt will call Dr Redmond Baseman office regarding ASA and Plavix. Per pt his office was to check with cardiology on this.   Anesthesia review: heart history  Patient denies shortness of breath, fever, cough and chest pain at PAT appointment   Patient verbalized understanding of instructions that were given to them at the PAT appointment. Patient was also instructed that they will need to review over the PAT instructions again at home before surgery.

## 2017-12-02 IMAGING — MR MR LUMBAR SPINE W/O CM
5 series · 35 of 48 positions shown · non-contrast
Comparison: 07/26/2016 abdomen radiograph. 06/14/2016 CT abdomen
and pelvis.

CLINICAL DATA: 66 y/o M; 2 weeks of lower back pain without
sciatica.

EXAM:
MRI LUMBAR SPINE WITHOUT CONTRAST
TECHNIQUE: Multiplanar, multisequence MR imaging of the lumbar spine was
performed. No intravenous contrast was administered.

[Series 2: T2 · sagittal · 4.0mm · 0.81mm/px · 5 of 15 slices shown (1 of 2)]
[im 1/15]
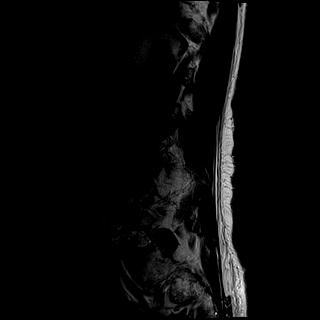
[im 4/15]
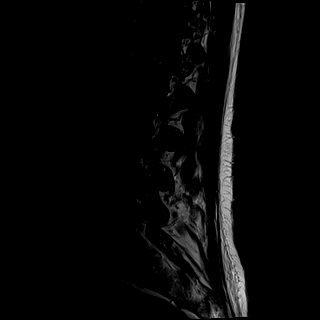
[im 8/15]
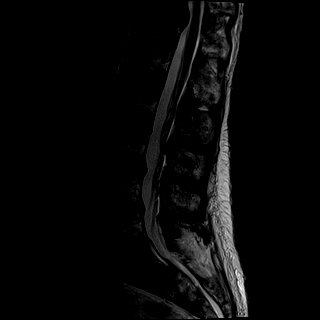
[im 11/15]
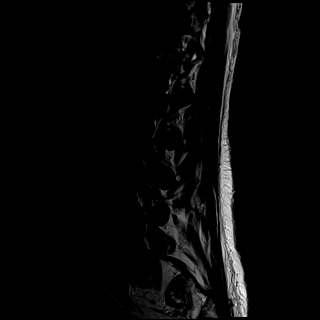
[im 15/15]
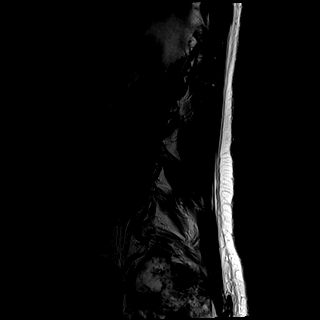

[Series 3: T1 · sagittal · 4.0mm · 0.81mm/px · 5 of 15 slices shown (1 of 2)]
[im 1/15]
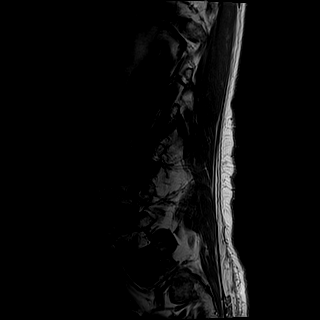
[im 4/15]
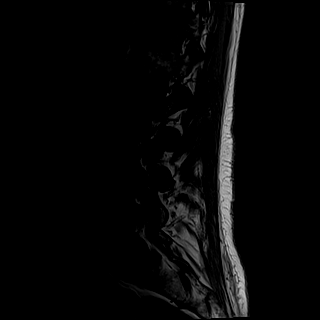
[im 8/15]
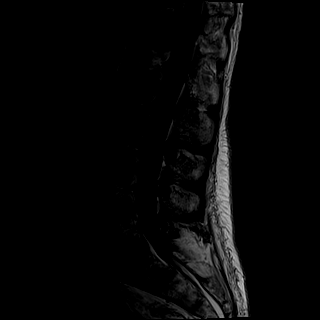
[im 11/15]
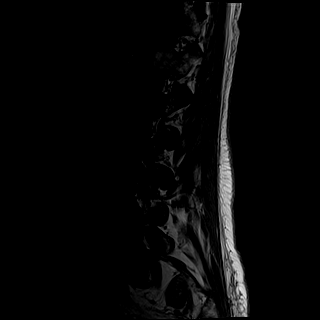
[im 15/15]
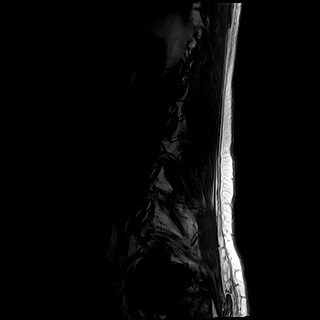

[Series 4: STIR · sagittal · 4.0mm · 1.02mm/px · 5 of 15 slices shown]
[im 1/15]
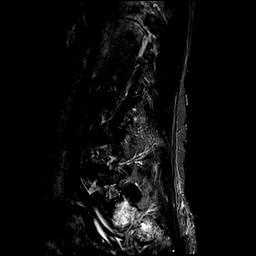
[im 3/15]
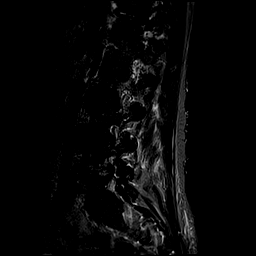
[im 6/15]
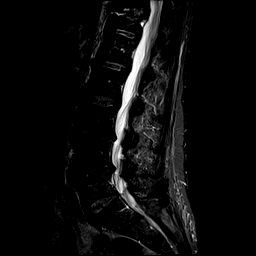
[im 9/15]
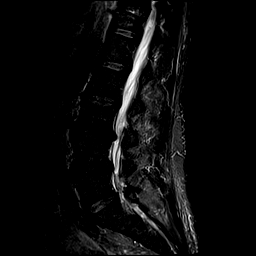
[im 12/15]
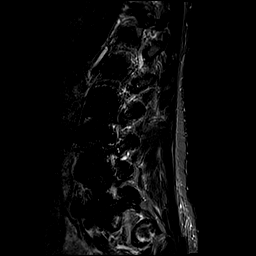

[Series 5: T2 · axial · 4.0mm · 0.78mm/px · z∈[-108,+131]mm · 10 of 42 slices shown (2 of 2)]
[im 3/42]
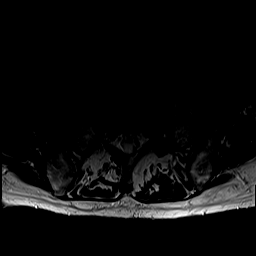
[im 6/42]
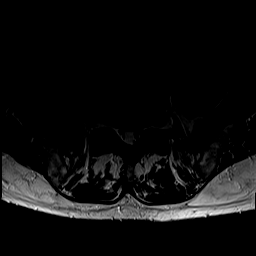
[im 9/42]
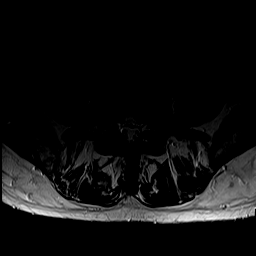
[im 14/42]
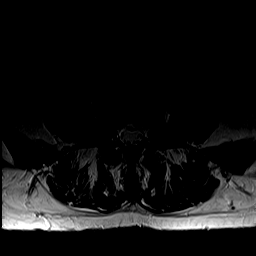
[im 20/42]
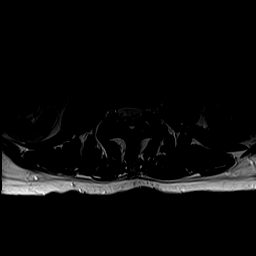
[im 22/42]
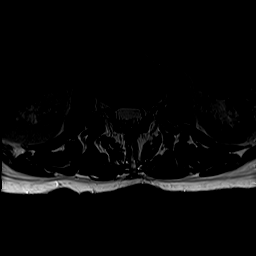
[im 25/42]
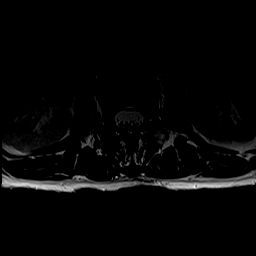
[im 31/42]
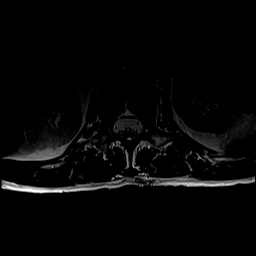
[im 36/42]
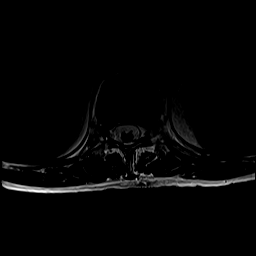
[im 42/42]
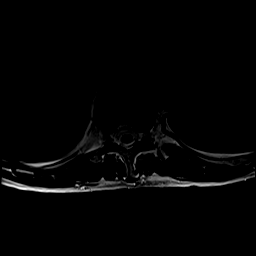

[Series 6: T1 · axial · 4.0mm · 0.39mm/px · z∈[-108,+131]mm · 10 of 42 slices shown (2 of 2)]
[im 3/42]
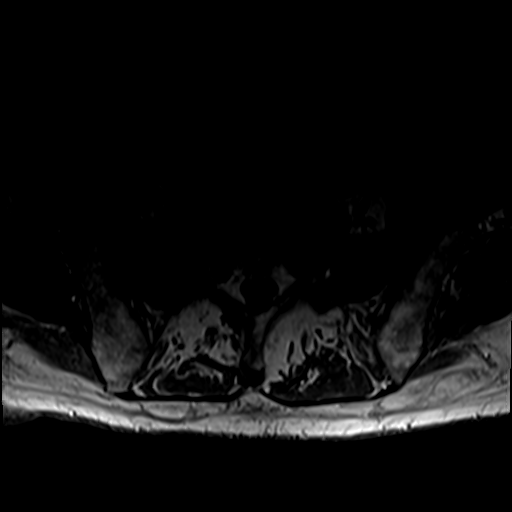
[im 6/42]
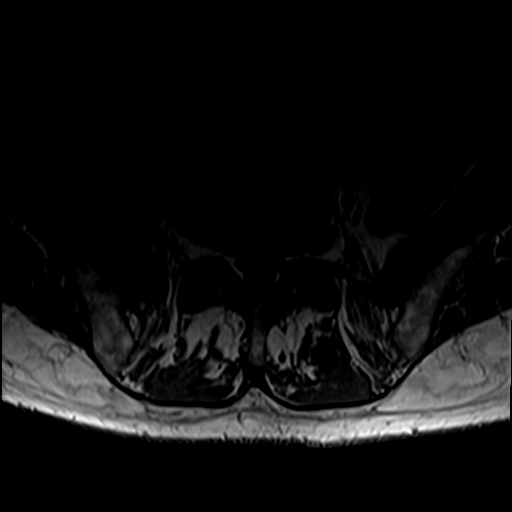
[im 9/42]
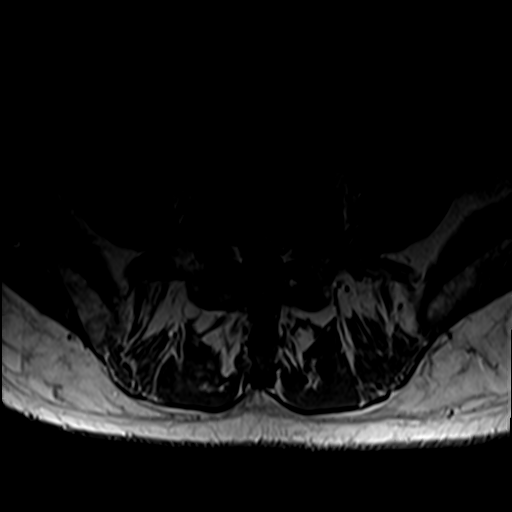
[im 14/42]
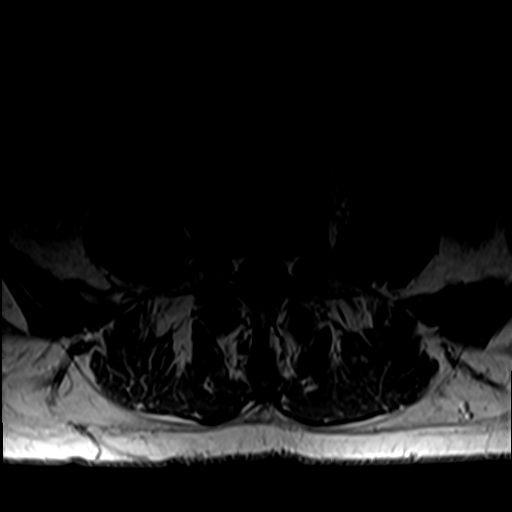
[im 20/42]
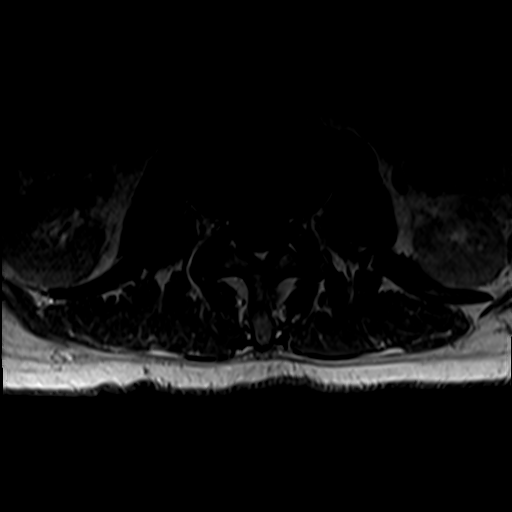
[im 22/42]
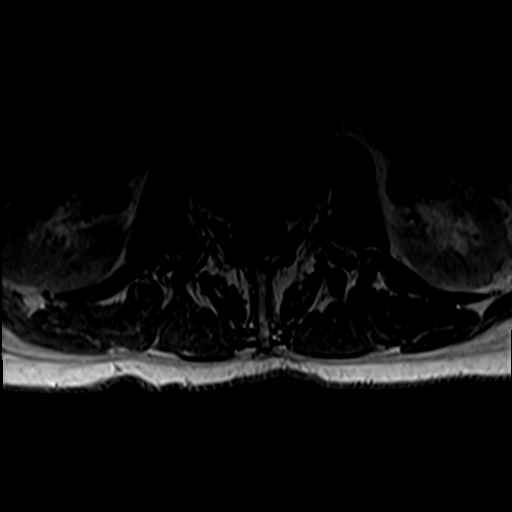
[im 25/42]
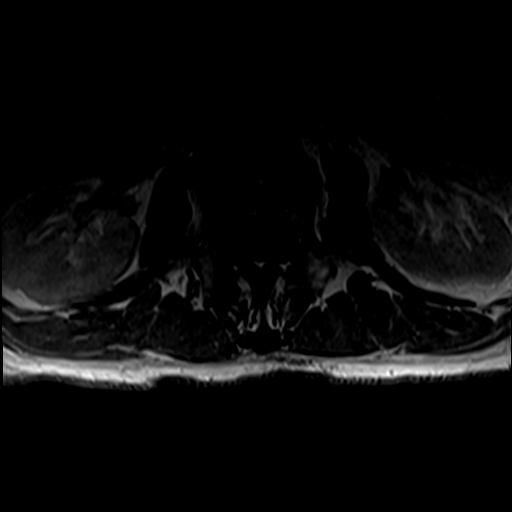
[im 31/42]
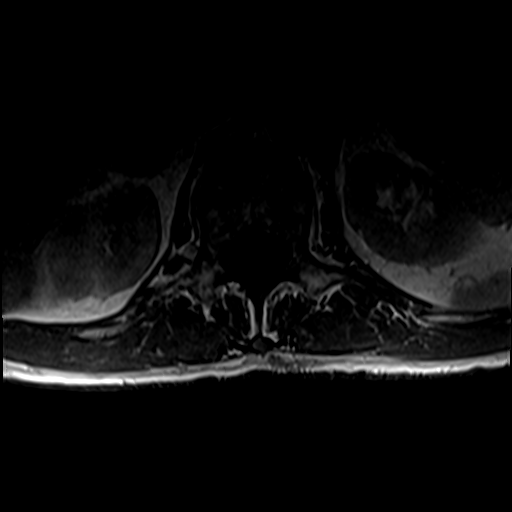
[im 36/42]
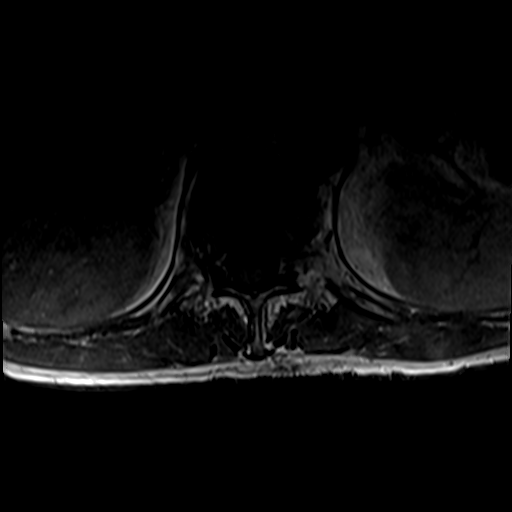
[im 42/42]
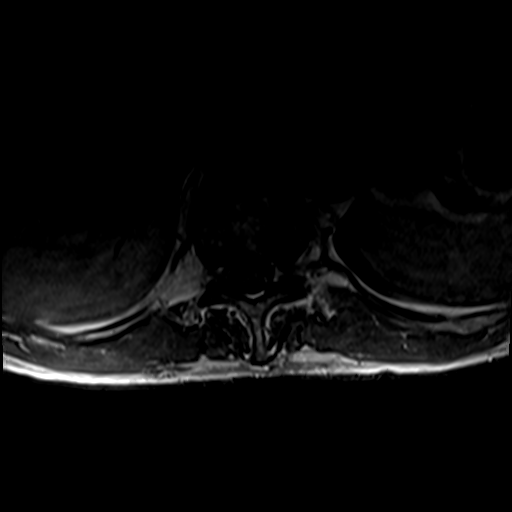

[35 of 48 positions shown; findings below may reference images not displayed]

FINDINGS: Segmentation:  Standard.

Alignment:  Physiologic.

Vertebrae: T12 moderate 50% anterior compression deformity with mild
vertebral body edema indicating a recent injury. Slight retropulsion
of the superior posterior endplate effacing ventral thecal sac
without significant canal stenosis.

Otherwise no evidence for discitis, fracture, or suspicious bone
lesion. Marrow signal is diffusely heterogeneous probably related to
chronic anemia.

Conus medullaris: Extends to the L1 level and appears normal.

Paraspinal and other soft tissues: Negative.

Disc levels:

L1-2: No significant disc displacement, foraminal narrowing, or
canal stenosis.

L2-3: No significant disc displacement, foraminal narrowing, or
canal stenosis.

L3-4: Small disc bulge and mild facet hypertrophy eccentric to the
left. Mild left-greater-than-right foraminal and lateral recess
narrowing. No significant canal stenosis.

L4-5: Small disc bulge and mild facet hypertrophy greater on the
right. Mild right greater than left foraminal and lateral recess
narrowing. No significant canal stenosis.

L5-S1: Minimal disc bulge and mild facet hypertrophy. No significant
foraminal narrowing or canal stenosis.
IMPRESSION: 1. T12 moderate 50% anterior compression deformity with mild edema
in the vertebral body indicating recent injury, new from [DATE].
2. Mild lumbar spondylosis greatest at the L3-4 and L4-5 levels
where there is mild foraminal narrowing. No high-grade foraminal
narrowing or canal stenosis.

By: Ingunn Harpa Ronlor M.D.

## 2017-12-04 ENCOUNTER — Telehealth: Payer: Self-pay | Admitting: Cardiology

## 2017-12-04 NOTE — Progress Notes (Signed)
Anesthesia Chart Review:  Case:  [id number] Date/Time:  12/08/17 0830   Procedure:  INSERTION OF IMPLANT FOR SLEEP APNEA (N/A )   Anesthesia type:  General   Pre-op diagnosis:  Obstructed sleep apnea   Location:  [facility] ROOM 09 / [facility]   Surgeon:  Melida Quitter, MD      DISCUSSION: 67 yo male never smoker for above procedure. Pertinent hx includes Anxiety, Depression, OSA on CPAP, Renal insufficiency, Orthostatic hypotension, Internal loop recorder, Hypothyroid, Anemia, CAD (PCI of LAD and RCA 06/2005; EF 60-65% by Echo 2019).   Pt was seen by cardiology Jory Sims, NP on 10/18/2017 for preop clearance prior to drug induced sleep endoscopy. Per her note at that time "Given past medical history and time since last visit, based on ACC/AHA guidelines, Shawn Lam would be at acceptable risk for the planned procedure without further cardiovascular testing. Plavix is to be held for 5 days. He has already held it for two days prior to being seen today."  Anticipate can proceed with procedure as planned barring acute status change.  VS: BP (!) 143/72   Pulse 65   Temp 36.7 C   Resp 20   Ht 6' (1.829 m)   Wt 76.3 kg   SpO2 100%   BMI 22.81 kg/m   PROVIDERS: Jinny Sanders, MD is PCP  Fransico Him, MD is Cardiologist last seen by Jory Sims, NP on 10/18/2017  Jamal Maes, MD is Nephrologist  LABS: Labs reviewed: Acceptable for surgery. Mildly elevated creatinine, appears to be at pt baseline based on review of previous results. Lab Results  Component Value Date   CREATININE 1.44 (H) 12/01/2017   CREATININE 1.32 (H) 10/18/2017   CREATININE 1.26 02/06/2017   (all labs ordered are listed, but only abnormal results are displayed)  Labs Reviewed  BASIC METABOLIC PANEL - Abnormal; Notable for the following components:      Result Value   Creatinine, Ser 1.44 (*)    GFR calc non Af Amer 49 (*)    GFR calc Af Amer 57 (*)    All other components within normal limits  CBC  - Abnormal; Notable for the following components:   RBC 4.21 (*)    MCV 103.6 (*)    All other components within normal limits     IMAGES: CHEST  2 VIEW 06/03/2016  COMPARISON:  06/17/2010  FINDINGS: Cardiomediastinal silhouette is stable. Mild hyperinflation. A cardiac recorder device noted in left anterior chest wall P no infiltrate or pleural effusion. No pulmonary edema. Osteopenia. Mild degenerative changes lower thoracic spine.  IMPRESSION: No active disease. Mild hyperinflation. Mild degenerative changes and osteopenia thoracic spine.  EKG: 10/18/2017: Sinus rhythm with short PR  CV: Echocardiogram 07/05/2017  Left ventricle: The cavity size was normal. Wall thickness was increased in a pattern of mild LVH. Systolic function was normal. The estimated ejection fraction was in the range of 60% to 65%. Wall motion was normal; there were no regional wall motion abnormalities. Doppler parameters are consistent with abnormal left ventricular relaxation (grade 1 diastolic dysfunction). The E/e&' ratio is <8, suggesting normal LV filling pressure. - Aorta: The sinus of valsalva is dilated at 4.0 cm. - Left atrium: The atrium was normal in size. - Right atrium: The atrium was at the upper limits of normal in size. - Tricuspid valve: There was trivial regurgitation. - Pulmonary arteries: PA peak pressure: 22 mm Hg (S). - Inferior vena cava: The vessel was normal in size.  The respirophasic diameter changes were in the normal range (= 50%), consistent with normal central venous pressure.  Impressions:  - Compared to a prior study in 06/2016, the LVEF is higher at 60-65%. The aorta is dilated to 4.0 cm at the sinus of valsalva, but measures 3.5 cm at the aortic root.  Nuclear stress 02/23/2016:  Nuclear stress EF: 42%.  Blood pressure demonstrated a normal response to exercise.  There was no ST segment deviation noted during stress.  Defect  1: There is a medium defect of moderate severity present in the basal inferior and mid inferior location.  The left ventricular ejection fraction is moderately decreased (30-44%).  This is a low risk study.   Low risk stress nuclear study with inferior thinning vs prior infarct; no ischemia; EF 42 with global hypokinesis and moderate LVE.  Cath 09/06/2013: ANGIOGRAPHIC DATA:   The left main coronary artery is normal.  The left anterior descending artery is widely patent. The proximal LAD stent contains distal margin 30% narrowing due to ISR.[name]  The left circumflex artery is widely patent.  The right coronary artery is widely patent. The mid right coronary stent is without evidence of ISR.  LEFT VENTRICULOGRAM:  Left ventricular angiogram was done in the 30 RAO projection and revealed 55% without regional wall motion abnormality.  IMPRESSIONS:  1. Widely patent coronary arteries including the proximal LAD stent and mid RCA stent. There is 25-30% in-stent restenosis within the distal margin of the LAD stent. 2. Normal LV function  RECOMMENDATION:  Medical therapy.  Past Medical History:  Diagnosis Date  . Anemia   . Anxiety   . Asymptomatic LV dysfunction    50-55% by echo 06/2016  . Berger's disease   . CAD (coronary artery disease) 06/2005   PCI of LAD and RCA  . Depression   . Dilated aortic root (HCC)    4cm at sinus of Valsalva  . Hematuria   . Hyperlipidemia   . Hypothyroidism   . Insomnia   . Left ureteral stone   . Orthostatic hypotension    negative tilt table  . OSA (obstructive sleep apnea)    moderate with AHI 17/hr, uses CPAP nightly  . PVC's (premature ventricular contractions) 07/07/2016  . Renal disorder    PT IS SEEING DR. Lorrene Reid- NEPHROLOGIST  . Rosacea   . Urticaria     Past Surgical History:  Procedure Laterality Date  . CARDIAC CATHETERIZATION  09/06/2013   [facility]  . CARDIAC CATHETERIZATION    . CORONARY ANGIOPLASTY  2004   STENT PLACEMENT   . CYSTOSCOPY WITH RETROGRADE PYELOGRAM, URETEROSCOPY AND STENT PLACEMENT Left 03/19/2014   Procedure: CYSTOSCOPY WITH RETROGRADE PYELOGRAM, URETEROSCOPY AND STENT PLACEMENT;  Surgeon: Junious Dresser, MD;  Location: [facility];  Service: Urology;  Laterality: Left;  . CYSTOSCOPY WITH RETROGRADE PYELOGRAM, URETEROSCOPY AND STENT PLACEMENT Bilateral 05/20/2015   Procedure:  CYSTOSCOPY WITH BILATERAL RETROGRADE PYELOGRAM,RIGHT  DIAGNOSTIC URETEROSCOPY ,LEFT URETEROSCOPY WITH HOLMIUM LASER  AND BILATERAL  STENT PLACEMENT ;  Surgeon: Alexis Frock, MD;  Location: [facility];  Service: Urology;  Laterality: Bilateral;  . DRUG INDUCED ENDOSCOPY N/A 10/20/2017   Procedure: DRUG INDUCED SLEEP ENDOSCOPY;  Surgeon: Melida Quitter, MD;  Location: [facility];  Service: ENT;  Laterality: N/A;  . EP IMPLANTABLE DEVICE N/A 05/13/2016   Procedure: Loop Recorder Insertion;  Surgeon: Deboraha Sprang, MD;  Location: [facility];  Service: Cardiovascular;  Laterality: N/A;  . HOLMIUM LASER APPLICATION Bilateral  05/20/2015   Procedure: HOLMIUM LASER APPLICATION;  Surgeon: Alexis Frock, MD;  Location: [facility];  Service: Urology;  Laterality: Bilateral;  . LEFT HEART CATHETERIZATION WITH CORONARY ANGIOGRAM N/A 09/06/2013   Procedure: LEFT HEART CATHETERIZATION WITH CORONARY ANGIOGRAM;  Surgeon: Sinclair Grooms, MD;  Location: [facility] CATH LAB;  Service: Cardiovascular;  Laterality: N/A;  . LEFT KNEE CAP  SURGERY ABOUT 40 YRS AGO  ? 1970    . STONE EXTRACTION WITH BASKET Left 03/19/2014   Procedure: STONE EXTRACTION WITH BASKET;  Surgeon: Junious Dresser, MD;  Location: [facility];  Service: Urology;  Laterality: Left;    MEDICATIONS: . aspirin EC 81 MG tablet  . atorvastatin (LIPITOR) 40 MG tablet  . clopidogrel (PLAVIX) 75 MG tablet  . diclofenac (VOLTAREN) 75 MG EC tablet  . ferrous sulfate 325 (65 FE) MG tablet  . fludrocortisone (FLORINEF) 0.1 MG tablet  . FLUoxetine (PROZAC) 20 MG capsule  . FLUoxetine  (PROZAC) 40 MG capsule  . furosemide (LASIX) 20 MG tablet  . levothyroxine (SYNTHROID, LEVOTHROID) 25 MCG tablet  . midodrine (PROAMATINE) 5 MG tablet  . mirtazapine (REMERON) 15 MG tablet  . NITROSTAT 0.4 MG SL tablet  . Teriparatide, Recombinant, (FORTEO) 600 MCG/2.4ML SOLN  . traMADol (ULTRAM) 50 MG tablet  . vitamin B-12 (CYANOCOBALAMIN) 1000 MCG tablet   No current facility-administered medications for this encounter.      Wynonia Musty [facility] Short Stay Center/Anesthesiology Phone [phone] 12/04/2017 4:50 PM

## 2017-12-04 NOTE — Telephone Encounter (Signed)
New message      [facility] Pre-operative Risk Assessment    Request for surgical clearance:  1. What type of surgery is being performed? Inspire Implant for snoring  2. When is this surgery scheduled? 12/08/2017  3. What type of clearance is required (medical clearance vs. Pharmacy clearance to hold med vs. Both)? Both  4. Are there any medications that need to be held prior to surgery and how long? Plavix,  will be up Dr. Radford Pax to determine length of time to withhold medication  5. Practice name and name of physician performing surgery? [facility], Dr. Redmond Baseman   6. What is your office phone number [phone]   7.   What is your office fax number [phone]  8.   Anesthesia type (None, local, MAC, general) ? General    Shawn Lam 12/04/2017, 1:37 PM  _________________________________________________________________   (provider comments below)

## 2017-12-06 NOTE — Telephone Encounter (Signed)
   Primary Cardiologist: Fransico Him, MD  Chart reviewed as part of pre-operative protocol coverage.   He was seen by Jory Sims, DNP on 10/18/2017.  This was preop for an endoscopic procedure and no further cardiovascular testing was needed.  Given past medical history and time since last visit, based on ACC/AHA guidelines, MEER REINDL would be at acceptable risk for the planned procedure without further cardiovascular testing.   Dr. Radford Pax was contacted by phone.  He has not had any interventions since 2015.  It is okay for him to hold the Plavix for 5 days prior to the planned procedure.  Restart after that as soon as possible per Dr. Redmond Baseman.  I will route this recommendation to the requesting party via Epic fax function and remove from pre-op pool.  Please call with questions.  Rhonda Barrett, PA-C 12/06/2017, 5:21 PM   5. [facility], Dr. Redmond Baseman   6. What is your office phone number [phone]   7.   What is your office fax number [phone]

## 2017-12-08 ENCOUNTER — Other Ambulatory Visit: Payer: Self-pay

## 2017-12-08 ENCOUNTER — Encounter (HOSPITAL_COMMUNITY): Payer: Self-pay | Admitting: *Deleted

## 2017-12-08 ENCOUNTER — Ambulatory Visit (HOSPITAL_COMMUNITY): Payer: Medicare Other | Admitting: Certified Registered Nurse Anesthetist

## 2017-12-08 ENCOUNTER — Observation Stay (HOSPITAL_COMMUNITY): Payer: Medicare Other

## 2017-12-08 ENCOUNTER — Observation Stay (HOSPITAL_COMMUNITY)
Admission: RE | Admit: 2017-12-08 | Discharge: 2017-12-09 | Disposition: A | Discharge status: Home / Self Care | Payer: Medicare Other | Source: Ambulatory Visit | Attending: Otolaryngology | Admitting: Otolaryngology

## 2017-12-08 ENCOUNTER — Encounter (HOSPITAL_COMMUNITY)
Admission: RE | Disposition: A | Discharge status: Home / Self Care | Payer: Self-pay | Source: Ambulatory Visit | Attending: Otolaryngology

## 2017-12-08 ENCOUNTER — Ambulatory Visit (HOSPITAL_COMMUNITY): Payer: Medicare Other | Admitting: Physician Assistant

## 2017-12-08 DIAGNOSIS — E039 Hypothyroidism, unspecified: Secondary | ICD-10-CM | POA: Insufficient documentation

## 2017-12-08 DIAGNOSIS — Z79899 Other long term (current) drug therapy: Secondary | ICD-10-CM | POA: Diagnosis not present

## 2017-12-08 DIAGNOSIS — Z7902 Long term (current) use of antithrombotics/antiplatelets: Secondary | ICD-10-CM | POA: Diagnosis not present

## 2017-12-08 DIAGNOSIS — Z885 Allergy status to narcotic agent status: Secondary | ICD-10-CM | POA: Diagnosis not present

## 2017-12-08 DIAGNOSIS — Z79890 Hormone replacement therapy (postmenopausal): Secondary | ICD-10-CM | POA: Diagnosis not present

## 2017-12-08 DIAGNOSIS — G4733 Obstructive sleep apnea (adult) (pediatric): Principal | ICD-10-CM | POA: Insufficient documentation

## 2017-12-08 DIAGNOSIS — Z791 Long term (current) use of non-steroidal anti-inflammatories (NSAID): Secondary | ICD-10-CM | POA: Insufficient documentation

## 2017-12-08 DIAGNOSIS — G4700 Insomnia, unspecified: Secondary | ICD-10-CM | POA: Insufficient documentation

## 2017-12-08 DIAGNOSIS — E785 Hyperlipidemia, unspecified: Secondary | ICD-10-CM | POA: Diagnosis not present

## 2017-12-08 DIAGNOSIS — Z955 Presence of coronary angioplasty implant and graft: Secondary | ICD-10-CM | POA: Diagnosis not present

## 2017-12-08 DIAGNOSIS — Z7982 Long term (current) use of aspirin: Secondary | ICD-10-CM | POA: Insufficient documentation

## 2017-12-08 DIAGNOSIS — I2510 Atherosclerotic heart disease of native coronary artery without angina pectoris: Secondary | ICD-10-CM | POA: Insufficient documentation

## 2017-12-08 DIAGNOSIS — N289 Disorder of kidney and ureter, unspecified: Secondary | ICD-10-CM | POA: Diagnosis not present

## 2017-12-08 DIAGNOSIS — Z800 Family history of malignant neoplasm of digestive organs: Secondary | ICD-10-CM | POA: Diagnosis not present

## 2017-12-08 DIAGNOSIS — Z8249 Family history of ischemic heart disease and other diseases of the circulatory system: Secondary | ICD-10-CM | POA: Insufficient documentation

## 2017-12-08 DIAGNOSIS — F329 Major depressive disorder, single episode, unspecified: Secondary | ICD-10-CM | POA: Insufficient documentation

## 2017-12-08 DIAGNOSIS — Z888 Allergy status to other drugs, medicaments and biological substances status: Secondary | ICD-10-CM | POA: Insufficient documentation

## 2017-12-08 DIAGNOSIS — F419 Anxiety disorder, unspecified: Secondary | ICD-10-CM | POA: Insufficient documentation

## 2017-12-08 DIAGNOSIS — I739 Peripheral vascular disease, unspecified: Secondary | ICD-10-CM | POA: Diagnosis not present

## 2017-12-08 DIAGNOSIS — D649 Anemia, unspecified: Secondary | ICD-10-CM | POA: Diagnosis not present

## 2017-12-08 HISTORY — PX: OTHER SURGICAL HISTORY: SHX169

## 2017-12-08 SURGERY — INSERTION, HYPOGLOSSAL NERVE STIMULATOR
Anesthesia: General | Site: Neck

## 2017-12-08 MED ORDER — LIDOCAINE-EPINEPHRINE 1 %-1:100000 IJ SOLN
INTRAMUSCULAR | Status: DC | PRN
  Administered 2017-12-08: 10 mL

## 2017-12-08 MED ORDER — FENTANYL CITRATE (PF) 100 MCG/2ML IJ SOLN
25.0000 ug | INTRAMUSCULAR | Status: DC | PRN
  Administered 2017-12-08: 50 ug via INTRAVENOUS

## 2017-12-08 MED ORDER — DICLOFENAC SODIUM 75 MG PO TBEC
75.0000 mg | DELAYED_RELEASE_TABLET | Freq: Two times a day (BID) | ORAL | Status: DC | PRN
  Filled 2017-12-08: qty 1

## 2017-12-08 MED ORDER — MIRTAZAPINE 15 MG PO TABS
15.0000 mg | ORAL_TABLET | Freq: Every day | ORAL | Status: DC
  Administered 2017-12-08: 15 mg via ORAL
  Filled 2017-12-08: qty 1

## 2017-12-08 MED ORDER — ARTIFICIAL TEARS OPHTHALMIC OINT
TOPICAL_OINTMENT | OPHTHALMIC | Status: AC
  Filled 2017-12-08: qty 14

## 2017-12-08 MED ORDER — FENTANYL CITRATE (PF) 100 MCG/2ML IJ SOLN
INTRAMUSCULAR | Status: AC
  Filled 2017-12-08: qty 2

## 2017-12-08 MED ORDER — CEFAZOLIN SODIUM-DEXTROSE 2-4 GM/100ML-% IV SOLN
2.0000 g | INTRAVENOUS | Status: AC
  Administered 2017-12-08: 2 g via INTRAVENOUS
  Filled 2017-12-08: qty 100

## 2017-12-08 MED ORDER — FLUOXETINE HCL 20 MG PO CAPS
60.0000 mg | ORAL_CAPSULE | Freq: Every day | ORAL | Status: DC
  Administered 2017-12-08 – 2017-12-09 (×2): 60 mg via ORAL
  Filled 2017-12-08 (×2): qty 3

## 2017-12-08 MED ORDER — SODIUM CHLORIDE 0.9 % IV SOLN
INTRAVENOUS | Status: AC
  Filled 2017-12-08: qty 500000

## 2017-12-08 MED ORDER — SODIUM CHLORIDE 0.9 % IV SOLN
INTRAVENOUS | Status: DC | PRN
  Administered 2017-12-08: 25 ug/min via INTRAVENOUS

## 2017-12-08 MED ORDER — ONDANSETRON HCL 4 MG/2ML IJ SOLN
INTRAMUSCULAR | Status: AC
  Filled 2017-12-08: qty 2

## 2017-12-08 MED ORDER — MIDAZOLAM HCL 5 MG/5ML IJ SOLN
INTRAMUSCULAR | Status: DC | PRN
  Administered 2017-12-08: 2 mg via INTRAVENOUS

## 2017-12-08 MED ORDER — ARTIFICIAL TEARS OPHTHALMIC OINT
TOPICAL_OINTMENT | OPHTHALMIC | Status: DC | PRN
  Administered 2017-12-08: 1 via OPHTHALMIC

## 2017-12-08 MED ORDER — PROPOFOL 10 MG/ML IV BOLUS
INTRAVENOUS | Status: DC | PRN
  Administered 2017-12-08: 30 mg via INTRAVENOUS
  Administered 2017-12-08: 140 mg via INTRAVENOUS

## 2017-12-08 MED ORDER — MIDODRINE HCL 5 MG PO TABS
5.0000 mg | ORAL_TABLET | Freq: Two times a day (BID) | ORAL | Status: DC
  Administered 2017-12-08 – 2017-12-09 (×3): 5 mg via ORAL
  Filled 2017-12-08 (×3): qty 1

## 2017-12-08 MED ORDER — ONDANSETRON HCL 4 MG PO TABS: 4.0000 mg | ORAL_TABLET | ORAL | Status: DC | PRN

## 2017-12-08 MED ORDER — TERIPARATIDE (RECOMBINANT) 600 MCG/2.4ML ~~LOC~~ SOLN: 20.0000 ug | Freq: Every day | SUBCUTANEOUS | Status: DC

## 2017-12-08 MED ORDER — FLUOXETINE HCL 40 MG PO CAPS: 40.0000 mg | ORAL_CAPSULE | Freq: Every morning | ORAL | Status: DC

## 2017-12-08 MED ORDER — PROMETHAZINE HCL 25 MG/ML IJ SOLN: 6.2500 mg | INTRAMUSCULAR | Status: DC | PRN

## 2017-12-08 MED ORDER — HYDROCODONE-ACETAMINOPHEN 5-325 MG PO TABS
1.0000 | ORAL_TABLET | ORAL | Status: DC | PRN
  Administered 2017-12-08 (×2): 1 via ORAL
  Administered 2017-12-08 – 2017-12-09 (×3): 2 via ORAL
  Filled 2017-12-08: qty 1
  Filled 2017-12-08: qty 2
  Filled 2017-12-08: qty 1
  Filled 2017-12-08 (×2): qty 2

## 2017-12-08 MED ORDER — HYDROCODONE-ACETAMINOPHEN 5-325 MG PO TABS: 1.0000 | ORAL_TABLET | Freq: Four times a day (QID) | ORAL | 0 refills | Status: DC | PRN

## 2017-12-08 MED ORDER — ONDANSETRON HCL 4 MG/2ML IJ SOLN: 4.0000 mg | INTRAMUSCULAR | Status: DC | PRN

## 2017-12-08 MED ORDER — NITROGLYCERIN 0.4 MG SL SUBL: 0.4000 mg | SUBLINGUAL_TABLET | SUBLINGUAL | Status: DC | PRN

## 2017-12-08 MED ORDER — MIDAZOLAM HCL 2 MG/2ML IJ SOLN
INTRAMUSCULAR | Status: AC
  Filled 2017-12-08: qty 2

## 2017-12-08 MED ORDER — PROPOFOL 10 MG/ML IV BOLUS
INTRAVENOUS | Status: AC
  Filled 2017-12-08: qty 20

## 2017-12-08 MED ORDER — SUCCINYLCHOLINE CHLORIDE 20 MG/ML IJ SOLN
INTRAMUSCULAR | Status: DC | PRN
  Administered 2017-12-08: 100 mg via INTRAVENOUS

## 2017-12-08 MED ORDER — VITAMIN B-12 1000 MCG PO TABS
1000.0000 ug | ORAL_TABLET | Freq: Every day | ORAL | Status: DC
  Administered 2017-12-08 – 2017-12-09 (×2): 1000 ug via ORAL
  Filled 2017-12-08 (×2): qty 1

## 2017-12-08 MED ORDER — ONDANSETRON HCL 4 MG/2ML IJ SOLN
INTRAMUSCULAR | Status: DC | PRN
  Administered 2017-12-08: 4 mg via INTRAVENOUS

## 2017-12-08 MED ORDER — DEXAMETHASONE SODIUM PHOSPHATE 10 MG/ML IJ SOLN
INTRAMUSCULAR | Status: DC | PRN
  Administered 2017-12-08: 10 mg via INTRAVENOUS

## 2017-12-08 MED ORDER — LIDOCAINE 2% (20 MG/ML) 5 ML SYRINGE
INTRAMUSCULAR | Status: AC
  Filled 2017-12-08: qty 5

## 2017-12-08 MED ORDER — PHENYLEPHRINE 40 MCG/ML (10ML) SYRINGE FOR IV PUSH (FOR BLOOD PRESSURE SUPPORT)
PREFILLED_SYRINGE | INTRAVENOUS | Status: DC | PRN
  Administered 2017-12-08 (×3): 40 ug via INTRAVENOUS

## 2017-12-08 MED ORDER — LIDOCAINE 2% (20 MG/ML) 5 ML SYRINGE
INTRAMUSCULAR | Status: DC | PRN
  Administered 2017-12-08: 60 mg via INTRAVENOUS

## 2017-12-08 MED ORDER — 0.9 % SODIUM CHLORIDE (POUR BTL) OPTIME
TOPICAL | Status: DC | PRN
  Administered 2017-12-08: 1000 mL

## 2017-12-08 MED ORDER — DEXAMETHASONE SODIUM PHOSPHATE 10 MG/ML IJ SOLN
INTRAMUSCULAR | Status: AC
  Filled 2017-12-08: qty 1

## 2017-12-08 MED ORDER — FENTANYL CITRATE (PF) 100 MCG/2ML IJ SOLN
INTRAMUSCULAR | Status: DC | PRN
  Administered 2017-12-08 (×2): 50 ug via INTRAVENOUS

## 2017-12-08 MED ORDER — LIDOCAINE-EPINEPHRINE 1 %-1:100000 IJ SOLN
INTRAMUSCULAR | Status: AC
  Filled 2017-12-08: qty 1

## 2017-12-08 MED ORDER — LACTATED RINGERS IV SOLN
INTRAVENOUS | Status: DC
  Administered 2017-12-08 (×2): via INTRAVENOUS

## 2017-12-08 MED ORDER — ATORVASTATIN CALCIUM 40 MG PO TABS
40.0000 mg | ORAL_TABLET | Freq: Every day | ORAL | Status: DC
  Administered 2017-12-08 – 2017-12-09 (×2): 40 mg via ORAL
  Filled 2017-12-08 (×2): qty 1

## 2017-12-08 MED ORDER — LEVOTHYROXINE SODIUM 25 MCG PO TABS
25.0000 ug | ORAL_TABLET | Freq: Every day | ORAL | Status: DC
  Administered 2017-12-08 – 2017-12-09 (×2): 25 ug via ORAL
  Filled 2017-12-08 (×2): qty 1

## 2017-12-08 MED ORDER — FERROUS SULFATE 325 (65 FE) MG PO TABS
325.0000 mg | ORAL_TABLET | Freq: Three times a day (TID) | ORAL | Status: DC
  Administered 2017-12-08 – 2017-12-09 (×3): 325 mg via ORAL
  Filled 2017-12-08 (×3): qty 1

## 2017-12-08 MED ORDER — SODIUM CHLORIDE 0.9 % IV SOLN
INTRAVENOUS | Status: DC | PRN
  Administered 2017-12-08: 500 mL

## 2017-12-08 MED ORDER — ASPIRIN EC 81 MG PO TBEC
81.0000 mg | DELAYED_RELEASE_TABLET | Freq: Every day | ORAL | Status: DC
  Administered 2017-12-08: 81 mg via ORAL
  Filled 2017-12-08: qty 1

## 2017-12-08 MED ORDER — FUROSEMIDE 20 MG PO TABS
20.0000 mg | ORAL_TABLET | Freq: Every day | ORAL | Status: DC
  Administered 2017-12-08 – 2017-12-09 (×2): 20 mg via ORAL
  Filled 2017-12-08 (×2): qty 1

## 2017-12-08 MED ORDER — MORPHINE SULFATE (PF) 2 MG/ML IV SOLN: 2.0000 mg | INTRAVENOUS | Status: DC | PRN

## 2017-12-08 MED ORDER — KCL IN DEXTROSE-NACL 20-5-0.45 MEQ/L-%-% IV SOLN
INTRAVENOUS | Status: DC
  Administered 2017-12-08 – 2017-12-09 (×2): via INTRAVENOUS
  Filled 2017-12-08 (×2): qty 1000

## 2017-12-08 MED ORDER — SUCCINYLCHOLINE CHLORIDE 200 MG/10ML IV SOSY
PREFILLED_SYRINGE | INTRAVENOUS | Status: AC
  Filled 2017-12-08: qty 10

## 2017-12-08 MED ORDER — TRAMADOL HCL 50 MG PO TABS
50.0000 mg | ORAL_TABLET | Freq: Four times a day (QID) | ORAL | Status: DC | PRN
  Administered 2017-12-08: 50 mg via ORAL
  Filled 2017-12-08: qty 1

## 2017-12-08 MED ORDER — FENTANYL CITRATE (PF) 250 MCG/5ML IJ SOLN
INTRAMUSCULAR | Status: AC
  Filled 2017-12-08: qty 5

## 2017-12-08 MED ORDER — EPHEDRINE SULFATE-NACL 50-0.9 MG/10ML-% IV SOSY
PREFILLED_SYRINGE | INTRAVENOUS | Status: DC | PRN
  Administered 2017-12-08 (×3): 20 mg via INTRAVENOUS
  Administered 2017-12-08: 10 mg via INTRAVENOUS

## 2017-12-08 MED ORDER — SODIUM CHLORIDE 0.9 % IV SOLN
0.0500 ug/kg/min | INTRAVENOUS | Status: DC
  Administered 2017-12-08: 0.1 ug/kg/min via INTRAVENOUS
  Filled 2017-12-08: qty 5000

## 2017-12-08 SURGICAL SUPPLY — 68 items
BAG DECANTER FOR FLEXI CONT (MISCELLANEOUS) ×3 IMPLANT
BLADE CLIPPER SURG (BLADE) ×3 IMPLANT
BLADE SURG 15 STRL LF DISP TIS (BLADE) ×3 IMPLANT
BLADE SURG 15 STRL SS (BLADE) ×6
CANISTER SUCT 3000ML PPV (MISCELLANEOUS) ×3 IMPLANT
CONT SPEC 4OZ CLIKSEAL STRL BL (MISCELLANEOUS) ×3 IMPLANT
CORD BIPOLAR FORCEPS 12FT (ELECTRODE) ×3 IMPLANT
COVER PROBE W GEL 5X96 (DRAPES) ×3 IMPLANT
COVER SURGICAL LIGHT HANDLE (MISCELLANEOUS) ×3 IMPLANT
CRADLE DONUT ADULT HEAD (MISCELLANEOUS) ×3 IMPLANT
DERMABOND ADVANCED (GAUZE/BANDAGES/DRESSINGS) ×4
DERMABOND ADVANCED .7 DNX12 (GAUZE/BANDAGES/DRESSINGS) ×2 IMPLANT
DRAPE C-ARM 35X43 STRL (DRAPES) ×3 IMPLANT
DRAPE HALF SHEET 40X57 (DRAPES) ×3 IMPLANT
DRAPE INCISE 13X13 STRL (DRAPES) IMPLANT
DRAPE INCISE IOBAN 66X45 STRL (DRAPES) ×3 IMPLANT
DRAPE MICROSCOPE LEICA 54X105 (DRAPE) ×6 IMPLANT
DRAPE ORTHO SPLIT 77X108 STRL (DRAPES) ×2
DRAPE SURG ORHT 6 SPLT 77X108 (DRAPES) ×1 IMPLANT
ELECT COATED BLADE 2.86 ST (ELECTRODE) ×3 IMPLANT
ELECT EMG 18 NIMS (NEUROSURGERY SUPPLIES) ×3
ELECTRODE EMG 18 NIMS (NEUROSURGERY SUPPLIES) ×1 IMPLANT
FORCEPS BIPOLAR SPETZLER 8 1.0 (NEUROSURGERY SUPPLIES) ×3 IMPLANT
FORCEPS TISS BAYO ENTCEPS (INSTRUMENTS) ×3 IMPLANT
GAUZE SPONGE 4X4 12PLY STRL LF (GAUZE/BANDAGES/DRESSINGS) ×3 IMPLANT
GAUZE SPONGE 4X4 16PLY XRAY LF (GAUZE/BANDAGES/DRESSINGS) ×3 IMPLANT
GENERATOR PULSE INSPIRE (Generator) ×3 IMPLANT
GLOVE BIO SURGEON STRL SZ 6.5 (GLOVE) ×2 IMPLANT
GLOVE BIO SURGEON STRL SZ7.5 (GLOVE) ×3 IMPLANT
GLOVE BIO SURGEONS STRL SZ 6.5 (GLOVE) ×1
GLOVE BIOGEL PI IND STRL 6.5 (GLOVE) ×2 IMPLANT
GLOVE BIOGEL PI INDICATOR 6.5 (GLOVE) ×4
GLOVE SURG SS PI 6.5 STRL IVOR (GLOVE) ×6 IMPLANT
GOWN STRL REUS W/ TWL LRG LVL3 (GOWN DISPOSABLE) ×4 IMPLANT
GOWN STRL REUS W/TWL LRG LVL3 (GOWN DISPOSABLE) ×8
IV CATH 18G X1.75 CATHLON (IV SOLUTION) ×3 IMPLANT
KIT BASIN OR (CUSTOM PROCEDURE TRAY) ×3 IMPLANT
KIT TURNOVER KIT B (KITS) ×3 IMPLANT
LEAD SENSING RESP INSPIRE (Lead) ×3 IMPLANT
LEAD SLEEP STIMULATION INSPIRE (Lead) ×3 IMPLANT
LOOP VESSEL MAXI BLUE (MISCELLANEOUS) ×3 IMPLANT
LOOP VESSEL MINI RED (MISCELLANEOUS) ×3 IMPLANT
MARKER SKIN DUAL TIP RULER LAB (MISCELLANEOUS) ×6 IMPLANT
NEEDLE HYPO 25GX1X1/2 BEV (NEEDLE) ×3 IMPLANT
NS IRRIG 1000ML POUR BTL (IV SOLUTION) ×3 IMPLANT
PAD ARMBOARD 7.5X6 YLW CONV (MISCELLANEOUS) ×3 IMPLANT
PENCIL BUTTON HOLSTER BLD 10FT (ELECTRODE) ×3 IMPLANT
PROBE NERVE STIMULATOR (NEUROSURGERY SUPPLIES) ×3 IMPLANT
REMOTE CONTROL SLEEP INSPIRE (MISCELLANEOUS) ×3 IMPLANT
RETRACTOR STAY HOOK 5MM (MISCELLANEOUS) IMPLANT
SLING ARM IMMOBILIZER LRG (SOFTGOODS) ×3 IMPLANT
SLING ARM IMMOBILIZER MED (SOFTGOODS) IMPLANT
SPONGE INTESTINAL PEANUT (DISPOSABLE) ×3 IMPLANT
STAPLER VISISTAT 35W (STAPLE) ×3 IMPLANT
SUT SILK 2 0 SH (SUTURE) ×3 IMPLANT
SUT SILK 3 0 RB1 (SUTURE) ×6 IMPLANT
SUT SILK 3 0 REEL (SUTURE) ×3 IMPLANT
SUT VIC AB 3-0 SH 27 (SUTURE) ×2
SUT VIC AB 3-0 SH 27X BRD (SUTURE) ×1 IMPLANT
SUT VIC AB 4-0 PS2 27 (SUTURE) ×9 IMPLANT
SUT VIC AB 5-0 P-3 18XBRD (SUTURE) ×2 IMPLANT
SUT VIC AB 5-0 P3 18 (SUTURE) ×4
SYR 10ML LL (SYRINGE) ×3 IMPLANT
SYR BULB IRRIGATION 50ML (SYRINGE) ×3 IMPLANT
TAPE CLOTH SURG 4X10 WHT LF (GAUZE/BANDAGES/DRESSINGS) IMPLANT
TOWEL NATURAL 10PK STERILE (DISPOSABLE) ×3 IMPLANT
TRAY ENT MC OR (CUSTOM PROCEDURE TRAY) ×3 IMPLANT
TRAY FOLEY MTR SLVR 14FR STAT (SET/KITS/TRAYS/PACK) IMPLANT

## 2017-12-08 NOTE — Anesthesia Procedure Notes (Signed)
Procedure Name: Intubation Date/Time: 12/08/2017 9:33 AM Performed by: Suzette Battiest, MD Pre-anesthesia Checklist: Patient identified, Suction available, Emergency Drugs available, Patient being monitored and Timeout performed Patient Re-evaluated:Patient Re-evaluated prior to induction Oxygen Delivery Method: Circle system utilized Preoxygenation: Pre-oxygenation with 100% oxygen Induction Type: IV induction Ventilation: Mask ventilation without difficulty Laryngoscope Size: Mac and 4 Grade View: Grade I Tube type: Oral Tube size: 7.5 mm Number of attempts: 1 Airway Equipment and Method: Stylet Placement Confirmation: ETT inserted through vocal cords under direct vision,  positive ETCO2 and breath sounds checked- equal and bilateral Secured at: 23 cm Tube secured with: Tape Dental Injury: Teeth and Oropharynx as per pre-operative assessment  Comments: Inserted by Lupita Shutter, SRNA

## 2017-12-08 NOTE — Transfer of Care (Signed)
Immediate Anesthesia Transfer of Care Note  Patient: Shawn Lam  Procedure(s) Performed: IMPLANTATION OF HYPOGLOSSAL NERVE STIMULATOR (INSPIRE) (N/A Neck)  Patient Location: PACU  Anesthesia Type:General  Level of Consciousness: awake, alert  and oriented  Airway & Oxygen Therapy: Patient Spontanous Breathing and Patient connected to face mask oxygen  Post-op Assessment: Report given to RN and Post -op Vital signs reviewed and stable  Post vital signs: Reviewed and stable  Last Vitals:  Vitals Value Taken Time  BP    Temp    Pulse    Resp    SpO2      Last Pain:  Vitals:   12/08/17 0648  TempSrc:   PainSc: 0-No pain         Complications: No apparent anesthesia complications

## 2017-12-08 NOTE — Progress Notes (Signed)
Patient ID: Shawn Lam, male   DOB: [birth year], 67 y.o.   MRN: [id number] Postop check, awake and alert, complains of sore throat but otherwise doing very well.    On exam, the tongue has normal mobility.  Voice sounds clear.  Breathing is clear.  Surgical sites are all intact without any signs of hematoma.  Stable postop.  Observe overnight.

## 2017-12-08 NOTE — Anesthesia Postprocedure Evaluation (Signed)
Anesthesia Post Note  Patient: Shawn Lam  Procedure(s) Performed: IMPLANTATION OF HYPOGLOSSAL NERVE STIMULATOR (INSPIRE) (N/A Neck)     Patient location during evaluation: PACU Anesthesia Type: General Level of consciousness: awake and alert Pain management: pain level controlled Vital Signs Assessment: post-procedure vital signs reviewed and stable Respiratory status: spontaneous breathing, nonlabored ventilation, respiratory function stable and patient connected to nasal cannula oxygen Cardiovascular status: blood pressure returned to baseline and stable Postop Assessment: no apparent nausea or vomiting Anesthetic complications: no    Last Vitals:  Vitals:   12/08/17 1310 12/08/17 1333  BP: 139/75 136/78  Pulse: 79 81  Resp: 16 20  Temp: 36.7 C 36.9 C  SpO2: 95% 96%    Last Pain:  Vitals:   12/08/17 1400  TempSrc:   PainSc: 4                  Tiajuana Amass

## 2017-12-08 NOTE — Op Note (Signed)
NAME: Shawn Lam, Shawn Lam. MEDICAL RECORD [id number] ACCOUNT [id number] DATE OF BIRTH:12/10/1950 FACILITY: [facility] LOCATION: [facility] PHYSICIAN:Zeb Rawl Guido Sander, MD  OPERATIVE REPORT  DATE OF PROCEDURE:  12/08/2017  PREOPERATIVE DIAGNOSIS:  Obstructive sleep apnea.  POSTOPERATIVE DIAGNOSIS:  Obstructive sleep apnea.  PROCEDURE: 1.  Placement of right-sided hypoglossal nerve stimulator. 2.  Placement of right chest wall sensory lead. 3.  Intraoperative testing of generator.  SURGEON:  Melida Quitter, MD.  ASSISTANT:  Jolene Provost, PA  ANESTHESIA:  General endotracheal anesthesia.  COMPLICATIONS:  None.  INDICATIONS:  The patient is a 67 year old male with a history of obstructive sleep apnea who has been unable to tolerate CPAP and presents for placement of the Inspire hypoglossal nerve stimulator.  FINDINGS:  Surgical anatomy was normal in placing the implant.  Exclusion branches were excluded from the cuff and the hypoglossal nerve as determined by the nerve integrity monitor.  DESCRIPTION OF PROCEDURE:  The patient was identified in the holding room, informed consent having been obtained including discussion of risks, benefits and alternatives.  The patient was brought to the operative suite and placed on the OR table in  supine position.  Anesthesia was induced and the patient was intubated by the anesthesia team without difficulty.  The bed was turned 180 degrees from anesthesia.  A shoulder roll was placed.  The nerve integrity monitor was then inserted into the tongue  with an electrode in the lateral tongue to stimulate the hyoglossus muscle and a second electrode in the floor of mouth to stimulate the genioglossus muscle.  The monitor was turned on for the procedure.  The incisions were then marked with a marking  pen in the proper location including midway between the margin of the mandible and the hyoid, starting at about 1 cm lateral to the midline and extending 5 cm  posteriorly.  The anterior chest wall incision was a couple of fingerbreadths below the  clavicle and 4.5 cm in length horizontally.  The third incision was along the right lateral chest wall about 5 cm lateral to the nipple line about even with the xiphoid extending back 5 cm curving with the rib structure.  Each of the incisions were  injected with 1% lidocaine with 1:100,000 epinephrine.  The patient was then prepped and draped in sterile fashion including placing a clear drape over the face and [place] over the skin.  The neck incision was made with a 15 blade scalpel and extended  through the subcutaneous layer using Bovie electrocautery.  It was skived inferiorly along the platysma where the platysma was divided and then more inferiorly using Bovie electrocautery.  Dissection was then performed down directly to the inferior  extent of the submandibular gland which was elevated and retracted superiorly and posteriorly.  This exposed the digastric tendon, which was elevated and vessel loops were then passed under it for retraction inferiorly.  The mylohyoid muscle was  dissected free and retracted anteriorly exposing the hypoglossal nerve.  The nerve was then carefully dissected removing fascial tissues from over the nerve to expose the branches.  The vein of Ranine was then divided and ligated.  The various branches  were then carefully dissected further and the operating microscope was then brought into the field for further dissecting.  Each of the branches were then stimulated with the bipolar nerve stimulator demonstrating inclusion branches that stimulated the  genioglossus muscle and exclusion branches were stimulated hypoglossal muscle.  Dissection was then performed under the nerve and a pocket created so  that the stimulator could be passed under the nerve to cuff around the nerve.  This took a couple of  different tries with some additional dissecting to allow the nerve to be fully encompassed in  the stimulator cuff while leaving the exclusion branches out.  This was accomplished and saline was then injected between the nerve and the cuff.  The remainder  of the stimulation lead was then placed in the neck and a bacitracin-soaked saline gauze was placed in the neck.  The anterior chest incision was then made with a fresh 15 blade scalpel through the skin and extended through skin and subcutaneous layer  down to the pectoralis fascia using Bovie electrocautery.  A pocket was then finger dissected inferiorly to about 5 cm of length.  This was then packed with gauze.  The lateral chest wall incision was then made with a fresh 15 blade scalpel through the  skin and extended through the subcutaneous layer to the serratus muscle using Bovie electrocautery.  The serratus was then spread exposing the external intercostal muscle, which was also spread exposing the internal intercostal muscle.  A malleable was  then passed between the external and internal intercostal muscles to a distance of 4 cm.  The sensory lead was then placed beneath the malleable and slid into place between the muscles.  The malleable was removed.  The anchor was then sutured down with  3-0 silk suture in 3 locations.  At this point, the tunneler was used to tunnel from the lateral chest incision to the anterior chest pocket.  The sensory lead was then passed to the pocket.  Likewise, the tunneler was then passed from the neck wound to  the anterior chest pocket by first dissecting under the platysma muscle a little ways and then passing the tunneler over the clavicle.  The stimulation lead was then passed to the pocket.  This was done after first passing the lead under the digastric  tendon and suturing the anchor to the tendon in 2 locations using 3-0 silk suture.  At this point, the generator was brought into the field and each of the 2 leads were cleaned off and dried and placed into the generator and secured down with the torque   screws.  They were seen to be in good position and were secured when tugged.  The generator was then rotated and wires were placed under the generator and the generator was placed into the pocket.  It was secured with a single 2-0 silk suture at the  superior medial quadrant.  At this point, the generator was tested using the [employer] and checked very well.  At this point, each of the wounds were copiously irrigated with saline.  Bleeding was controlled with bipolar electrocautery.  The  chest wounds were closed with 4-0 Vicryl suture in a simple interrupted fashion and the subcutaneous layer and Dermabond on the skin.  The neck incision was closed with 3-0 Vicryl suture in a simple running fashion in the platysma muscle and then 4-0  Vicryl suture in a simple interrupted fashion in the subcutaneous layer.  The skin was closed with Dermabond.  Drapes were removed and the nerve integrity monitor was removed.  The patient was cleaned off and pressure dressing was applied to each of the  wounds.  He was then turned back to anesthesia for wakeup was extubated in the recovery room in stable condition.  TN/NUANCE  D:12/08/2017 T:12/08/2017 JOB:[id number]

## 2017-12-08 NOTE — Brief Op Note (Signed)
12/08/2017  12:15 PM  PATIENT:  [name]  67 y.o. male  PRE-OPERATIVE DIAGNOSIS:  OBSTRUCTIVE SLEEP APNEA  POST-OPERATIVE DIAGNOSIS:  OBSTRUCTIVE SLEEP APNEA  PROCEDURE:  Procedure(s): IMPLANTATION OF HYPOGLOSSAL NERVE STIMULATOR (INSPIRE) (N/A)  SURGEON:  Surgeon(s) and Role:    Melida Quitter, MD - Primary  PHYSICIAN ASSISTANT: [place]  ASSISTANTS: none   ANESTHESIA:   general  EBL:  20 mL   BLOOD ADMINISTERED:none  DRAINS: none   LOCAL MEDICATIONS USED:  LIDOCAINE   SPECIMEN:  No Specimen  DISPOSITION OF SPECIMEN:  N/A  COUNTS:  YES  TOURNIQUET:  * No tourniquets in log *  DICTATION: .Other Dictation: Dictation Number [id number]  PLAN OF CARE: Admit for overnight observation  PATIENT DISPOSITION:  PACU - hemodynamically stable.   Delay start of Pharmacological VTE agent (>24hrs) due to surgical blood loss or risk of bleeding: yes

## 2017-12-08 NOTE — Anesthesia Preprocedure Evaluation (Addendum)
Anesthesia Evaluation  Patient identified by MRN, date of birth, ID band Patient awake    Reviewed: Allergy & Precautions, NPO status , Patient's Chart, lab work & pertinent test results  Airway Mallampati: II  TM Distance: >3 FB Neck ROM: Full    Dental  (+) Dental Advisory Given   Pulmonary sleep apnea ,    breath sounds clear to auscultation       Cardiovascular + CAD and + Peripheral Vascular Disease   Rhythm:Regular Rate:Normal     Neuro/Psych Anxiety Depression negative neurological ROS     GI/Hepatic Neg liver ROS, GERD  ,  Endo/Other  Hypothyroidism   Renal/GU CRFRenal disease     Musculoskeletal   Abdominal   Peds  Hematology  (+) anemia ,   Anesthesia Other Findings   Reproductive/Obstetrics                            Lab Results  Component Value Date   WBC 6.9 12/01/2017   HGB 13.8 12/01/2017   HCT 43.6 12/01/2017   MCV 103.6 (H) 12/01/2017   PLT 185 12/01/2017   Lab Results  Component Value Date   CREATININE 1.44 (H) 12/01/2017   BUN 21 12/01/2017   NA 143 12/01/2017   K 4.7 12/01/2017   CL 108 12/01/2017   CO2 29 12/01/2017    Anesthesia Physical Anesthesia Plan  ASA: III  Anesthesia Plan: General   Post-op Pain Management:    Induction: Intravenous  PONV Risk Score and Plan: 2 and Ondansetron, Dexamethasone and Treatment may vary due to age or medical condition  Airway Management Planned: Oral ETT  Additional Equipment:   Intra-op Plan:   Post-operative Plan: Extubation in OR  Informed Consent: I have reviewed the patients History and Physical, chart, labs and discussed the procedure including the risks, benefits and alternatives for the proposed anesthesia with the patient or authorized representative who has indicated his/her understanding and acceptance.   Dental advisory given  Plan Discussed with: CRNA  Anesthesia Plan Comments:          Anesthesia Quick Evaluation

## 2017-12-08 NOTE — H&P (Signed)
Shawn Lam is an 67 y.o. male.   Chief Complaint: Obstructive sleep apnea HPI: 67 year old male with obstructive sleep apnea who has been unable to tolerate CPAP.  He presents for [facility] placement.  Past Medical History:  Diagnosis Date  . Anemia   . Anxiety   . Asymptomatic LV dysfunction    50-55% by echo 06/2016  . Berger's disease   . CAD (coronary artery disease) 06/2005   PCI of LAD and RCA  . Depression   . Dilated aortic root (HCC)    4cm at sinus of Valsalva  . Hematuria   . Hyperlipidemia   . Hypothyroidism   . Insomnia   . Left ureteral stone   . Orthostatic hypotension    negative tilt table  . OSA (obstructive sleep apnea)    moderate with AHI 17/hr, uses CPAP nightly  . PVC's (premature ventricular contractions) 07/07/2016  . Renal disorder    PT IS SEEING DR. Lorrene Reid- NEPHROLOGIST  . Rosacea   . Urticaria     Past Surgical History:  Procedure Laterality Date  . CARDIAC CATHETERIZATION  09/06/2013   [place]  . CARDIAC CATHETERIZATION    . CORONARY ANGIOPLASTY  2004   STENT PLACEMENT  . CYSTOSCOPY WITH RETROGRADE PYELOGRAM, URETEROSCOPY AND STENT PLACEMENT Left 03/19/2014   Procedure: CYSTOSCOPY WITH RETROGRADE PYELOGRAM, URETEROSCOPY AND STENT PLACEMENT;  Surgeon: Junious Dresser, MD;  Location: [facility];  Service: Urology;  Laterality: Left;  . CYSTOSCOPY WITH RETROGRADE PYELOGRAM, URETEROSCOPY AND STENT PLACEMENT Bilateral 05/20/2015   Procedure:  CYSTOSCOPY WITH BILATERAL RETROGRADE PYELOGRAM,RIGHT  DIAGNOSTIC URETEROSCOPY ,LEFT URETEROSCOPY WITH HOLMIUM LASER  AND BILATERAL  STENT PLACEMENT ;  Surgeon: Alexis Frock, MD;  Location: [facility];  Service: Urology;  Laterality: Bilateral;  . DRUG INDUCED ENDOSCOPY N/A 10/20/2017   Procedure: DRUG INDUCED SLEEP ENDOSCOPY;  Surgeon: Melida Quitter, MD;  Location: [place];  Service: ENT;  Laterality: N/A;  . EP IMPLANTABLE DEVICE N/A 05/13/2016   Procedure: Loop Recorder Insertion;  Surgeon: Deboraha Sprang, MD;  Location: [facility];  Service: Cardiovascular;  Laterality: N/A;  . HOLMIUM LASER APPLICATION Bilateral 12/12/9028   Procedure: HOLMIUM LASER APPLICATION;  Surgeon: Alexis Frock, MD;  Location: [facility];  Service: Urology;  Laterality: Bilateral;  . LEFT HEART CATHETERIZATION WITH CORONARY ANGIOGRAM N/A 09/06/2013   Procedure: LEFT HEART CATHETERIZATION WITH CORONARY ANGIOGRAM;  Surgeon: Sinclair Grooms, MD;  Location: [facility] CATH LAB;  Service: Cardiovascular;  Laterality: N/A;  . LEFT KNEE CAP  SURGERY ABOUT 40 YRS AGO  ? 1970    . STONE EXTRACTION WITH BASKET Left 03/19/2014   Procedure: STONE EXTRACTION WITH BASKET;  Surgeon: Junious Dresser, MD;  Location: [facility];  Service: Urology;  Laterality: Left;    Family History  Problem Relation Age of Onset  . Coronary artery disease Mother   . Heart attack Mother   . Heart disease Mother   . Hypertension Mother   . Cancer Father        lung and colon  . Lung cancer Father        smoker  . Diabetes Brother    Social History:  reports that he has never smoked. He has never used smokeless tobacco. He reports that he drinks about 1.0 standard drinks of alcohol per week. He reports that he does not use drugs.  Allergies:  Allergies  Allergen Reactions  . Codeine Nausea Only  . Tape Other (  See Comments)    Use only paper tape. Severe blistering and bruising with other tapes. No cloth tape.    Medications Prior to Admission  Medication Sig Dispense Refill  . aspirin EC 81 MG tablet Take 81 mg by mouth at bedtime.     [name] atorvastatin (LIPITOR) 40 MG tablet TAKE 1 TABLET BY MOUTH DAILY. (Patient taking differently: Take 40 mg by mouth daily. ) 90 tablet 3  . clopidogrel (PLAVIX) 75 MG tablet Take 1 tablet (75 mg total) by mouth daily. 90 tablet 3  . diclofenac (VOLTAREN) 75 MG EC tablet Take 75 mg by mouth 2 (two) times daily as needed for pain.  0  . ferrous sulfate 325 (65 FE) MG tablet TAKE 1 TABLET BY MOUTH 3 TIMES  DAILY WITH MEALS. (Patient taking differently: Take 325 mg by mouth 3 (three) times daily. ) 270 tablet 3  . FLUoxetine (PROZAC) 20 MG capsule Take 1 capsule (20 mg total) by mouth daily. Take with 40 mg to equal 60 mg once daily 90 capsule 0  . FLUoxetine (PROZAC) 40 MG capsule Take 1 capsule (40 mg total) by mouth every morning. 90 capsule 0  . furosemide (LASIX) 20 MG tablet TAKE 1 TABLET BY MOUTH EVERY DAY 90 tablet 2  . levothyroxine (SYNTHROID, LEVOTHROID) 25 MCG tablet TAKE 1 TABLET DAILY BEFORE BREAKFAST 90 tablet 0  . midodrine (PROAMATINE) 5 MG tablet TAKE 1 TABLET (5 MG TOTAL) BY MOUTH 2 (TWO) TIMES DAILY WITH BREAKFAST AND LUNCH. (Patient taking differently: Take 5 mg by mouth 2 (two) times daily. ) 180 tablet 3  . mirtazapine (REMERON) 15 MG tablet Take 1 tablet (15 mg total) by mouth at bedtime. 90 tablet 1  . NITROSTAT 0.4 MG SL tablet PLACE 1 TABLET (0.4 MG TOTAL) UNDER THE TONGUE EVERY 5 (FIVE) MINUTES AS NEEDED FOR CHEST PAIN. (Patient taking differently: Place 0.4 mg under the tongue every 5 (five) minutes as needed for chest pain. ) 25 tablet 5  . Teriparatide, Recombinant, (FORTEO) 600 MCG/2.4ML SOLN Inject 20 mcg daily into the skin.     [name] traMADol (ULTRAM) 50 MG tablet Take 50 mg by mouth every 6 (six) hours as needed for pain.  0  . vitamin B-12 (CYANOCOBALAMIN) 1000 MCG tablet Take 1,000 mcg by mouth daily.     . fludrocortisone (FLORINEF) 0.1 MG tablet Take 1 tablet (0.1 mg total) by mouth daily. (Patient not taking: Reported on 11/24/2017) 30 tablet 11    No results found for this or any previous visit (from the past 48 hour(s)). No results found.  Review of Systems  All other systems reviewed and are negative.   Blood pressure (!) 144/71, pulse 60, temperature (!) 97.4 F (36.3 C), temperature source Oral, resp. rate 20, SpO2 95 %. Physical Exam  Constitutional: He is oriented to person, place, and time. He appears well-developed and well-nourished. No distress.   HENT:  Head: Normocephalic and atraumatic.  Right Ear: External ear normal.  Left Ear: External ear normal.  Nose: Nose normal.  Mouth/Throat: Oropharynx is clear and moist.  Eyes: Pupils are equal, round, and reactive to light. Conjunctivae and EOM are normal.  Neck: Normal range of motion. Neck supple.  Cardiovascular: Normal rate.  Respiratory: Effort normal.  Neurological: He is alert and oriented to person, place, and time. No cranial nerve deficit.  Skin: Skin is warm and dry.  Psychiatric: He has a normal mood and affect. His behavior is normal. Judgment and thought content normal.  Assessment/Plan Obstructive sleep apnea To OR for Inspire placement.  Melida Quitter, MD 12/08/2017, 8:42 AM

## 2017-12-09 DIAGNOSIS — Z7902 Long term (current) use of antithrombotics/antiplatelets: Secondary | ICD-10-CM | POA: Diagnosis not present

## 2017-12-09 DIAGNOSIS — Z791 Long term (current) use of non-steroidal anti-inflammatories (NSAID): Secondary | ICD-10-CM | POA: Diagnosis not present

## 2017-12-09 DIAGNOSIS — Z7982 Long term (current) use of aspirin: Secondary | ICD-10-CM | POA: Diagnosis not present

## 2017-12-09 DIAGNOSIS — E039 Hypothyroidism, unspecified: Secondary | ICD-10-CM | POA: Diagnosis not present

## 2017-12-09 DIAGNOSIS — Z79890 Hormone replacement therapy (postmenopausal): Secondary | ICD-10-CM | POA: Diagnosis not present

## 2017-12-09 DIAGNOSIS — G4733 Obstructive sleep apnea (adult) (pediatric): Secondary | ICD-10-CM | POA: Diagnosis not present

## 2017-12-09 NOTE — Progress Notes (Signed)
Patient given discharge instructions.Patient verbalized understanding and aware of follow up appointments. Patient left unit in stable condition via wheelchair with nursing staff .

## 2017-12-09 NOTE — Discharge Summary (Signed)
Physician Discharge Summary  Patient ID: Shawn Lam MRN: [id number] DOB/AGE: [birth year] 67 y.o.  Admit date: 12/08/2017 Discharge date: 12/09/2017  Admission Diagnoses:Sleep apnea  Discharge Diagnoses:  Active Problems:   OSA (obstructive sleep apnea)   Discharged Condition: good  Hospital Course: no complications  Consults: none  Significant Diagnostic Studies: none  Treatments: surgery: INSPIRE implant  Discharge Exam: Blood pressure 131/76, pulse 67, temperature (!) 97.4 F (36.3 C), temperature source Oral, resp. rate 20, SpO2 100 %. PHYSICAL EXAM: Incisions all excellent, dressing removed.  Disposition: Discharge disposition: 01-Home or Self Care       Discharge Instructions    Diet - low sodium heart healthy   Complete by:  As directed    Diet - low sodium heart healthy   Complete by:  As directed    Discharge instructions   Complete by:  As directed    OK to allow incisions to get wet, gently pat dry.  Do not apply ointment to incisions.  Avoid heavy lifting or swinging for four weeks.   Increase activity slowly   Complete by:  As directed    Increase activity slowly   Complete by:  As directed      Allergies as of 12/09/2017      Reactions   Codeine Nausea Only   Tape Other (See Comments)   Use only paper tape. Severe blistering and bruising with other tapes. No cloth tape.      Medication List    STOP taking these medications   clopidogrel 75 MG tablet Commonly known as:  PLAVIX     TAKE these medications   aspirin EC 81 MG tablet Take 81 mg by mouth at bedtime.   atorvastatin 40 MG tablet Commonly known as:  LIPITOR TAKE 1 TABLET BY MOUTH DAILY.   diclofenac 75 MG EC tablet Commonly known as:  VOLTAREN Take 75 mg by mouth 2 (two) times daily as needed for pain.   ferrous sulfate 325 (65 FE) MG tablet TAKE 1 TABLET BY MOUTH 3 TIMES DAILY WITH MEALS. What changed:  See the new instructions.   fludrocortisone 0.1 MG  tablet Commonly known as:  FLORINEF Take 1 tablet (0.1 mg total) by mouth daily.   FLUoxetine 20 MG capsule Commonly known as:  PROZAC Take 1 capsule (20 mg total) by mouth daily. Take with 40 mg to equal 60 mg once daily   FLUoxetine 40 MG capsule Commonly known as:  PROZAC Take 1 capsule (40 mg total) by mouth every morning.   FORTEO 600 MCG/2.4ML Soln Generic drug:  Teriparatide (Recombinant) Inject 20 mcg daily into the skin.   furosemide 20 MG tablet Commonly known as:  LASIX TAKE 1 TABLET BY MOUTH EVERY DAY   HYDROcodone-acetaminophen 5-325 MG tablet Commonly known as:  NORCO/VICODIN Take 1-2 tablets by mouth every 6 (six) hours as needed for moderate pain.   levothyroxine 25 MCG tablet Commonly known as:  SYNTHROID, LEVOTHROID TAKE 1 TABLET DAILY BEFORE BREAKFAST   midodrine 5 MG tablet Commonly known as:  PROAMATINE TAKE 1 TABLET (5 MG TOTAL) BY MOUTH 2 (TWO) TIMES DAILY WITH BREAKFAST AND LUNCH. What changed:  See the new instructions.   mirtazapine 15 MG tablet Commonly known as:  REMERON Take 1 tablet (15 mg total) by mouth at bedtime.   NITROSTAT 0.4 MG SL tablet Generic drug:  nitroGLYCERIN PLACE 1 TABLET (0.4 MG TOTAL) UNDER THE TONGUE EVERY 5 (FIVE) MINUTES AS NEEDED FOR CHEST PAIN. What changed:  See  the new instructions.   traMADol 50 MG tablet Commonly known as:  ULTRAM Take 50 mg by mouth every 6 (six) hours as needed for pain.   vitamin B-12 1000 MCG tablet Commonly known as:  CYANOCOBALAMIN Take 1,000 mcg by mouth daily.      Follow-up Information    Melida Quitter, MD. Schedule an appointment as soon as possible for a visit in 1 week.   Specialty:  Otolaryngology Contact information: [address] [place] [address] [phone]           Signed: Izora Gala 12/09/2017, 9:38 AM

## 2017-12-09 NOTE — Discharge Instructions (Signed)
Resume Plavix on Tuesday as long as there is no bleeding.  Resume aspirin tomorrow  Avoid strenuous activity.  You may shower and use soap and water. Do not use any creams, oils or ointment.

## 2017-12-13 NOTE — OR Nursing (Signed)
ADDED IMPLANT INFORMATION

## 2017-12-25 ENCOUNTER — Ambulatory Visit (INDEPENDENT_AMBULATORY_CARE_PROVIDER_SITE_OTHER): Payer: Medicare Other | Admitting: *Deleted

## 2017-12-25 DIAGNOSIS — R002 Palpitations: Secondary | ICD-10-CM

## 2017-12-26 NOTE — Progress Notes (Signed)
[facility] Summary Report / Loop Recorder 

## 2017-12-28 LAB — CUP PACEART REMOTE DEVICE CHECK
Implantable Pulse Generator Implant Date: 20180202
MDC IDC SESS DTM: 20190815020727

## 2018-01-03 ENCOUNTER — Other Ambulatory Visit: Payer: Self-pay | Admitting: Family Medicine

## 2018-01-07 LAB — CUP PACEART REMOTE DEVICE CHECK
MDC IDC PG IMPLANT DT: 20180202
MDC IDC SESS DTM: 20190917044054

## 2018-01-15 DIAGNOSIS — G4733 Obstructive sleep apnea (adult) (pediatric): Secondary | ICD-10-CM | POA: Diagnosis not present

## 2018-01-23 ENCOUNTER — Ambulatory Visit (INDEPENDENT_AMBULATORY_CARE_PROVIDER_SITE_OTHER): Payer: Medicare Other | Admitting: Family Medicine

## 2018-01-23 ENCOUNTER — Encounter: Payer: Self-pay | Admitting: Family Medicine

## 2018-01-23 VITALS — BP 120/70 | HR 65 | Temp 97.8°F | Ht 69.0 in | Wt 169.5 lb

## 2018-01-23 DIAGNOSIS — M546 Pain in thoracic spine: Secondary | ICD-10-CM

## 2018-01-23 DIAGNOSIS — Z23 Encounter for immunization: Secondary | ICD-10-CM | POA: Diagnosis not present

## 2018-01-23 DIAGNOSIS — I2510 Atherosclerotic heart disease of native coronary artery without angina pectoris: Secondary | ICD-10-CM | POA: Diagnosis not present

## 2018-01-23 MED ORDER — CYCLOBENZAPRINE HCL 10 MG PO TABS: 5.0000 mg | ORAL_TABLET | Freq: Every evening | ORAL | 0 refills | Status: DC | PRN

## 2018-01-23 NOTE — Assessment & Plan Note (Signed)
Most likely MSK strain.Marland Kitchenno focal vertebral pain. Trial of muscle relaxant and home PT.  If not improving add back diclofenac and refer to PT if X-ray to rule out compression fracture negative.

## 2018-01-23 NOTE — Progress Notes (Signed)
   Subjective:    Patient ID: Shawn Lam, male    DOB: 1951/01/16, 67 y.o.   MRN: [id number]  HPI   67 year old male with history of thoracic compression fracture, osteoporosis presents with new onset mid  back pain x 1 month.  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 Stabbng all the time.   Worst during the day when on feet, gradually worsening till end of day.  Pain with deep breaths, lying down.  Gone by the next morning when he wakes up.   he has tried diclofenac, hydrocodone.. No relief  Does not bother him if he does not work.   No radiation of pain.  No numbness, no new weakness.  No proceeding falls.   S/P khyphoplasty  2018 for throacic compression fracute  on forteo fror osteoporosis  Review of Systems  Constitutional: Negative for fatigue and fever.  HENT: Negative for ear pain.   Eyes: Negative for pain.  Respiratory: Negative for cough and shortness of breath.   Cardiovascular: Negative for chest pain, palpitations and leg swelling.  Gastrointestinal: Negative for abdominal pain.  Genitourinary: Negative for dysuria.  Musculoskeletal: Negative for arthralgias.  Neurological: Negative for syncope, light-headedness and headaches.  Psychiatric/Behavioral: Negative for dysphoric mood.       Objective:   Physical Exam  Constitutional: He is oriented to person, place, and time. Vital signs are normal. He appears well-developed and well-nourished.  HENT:  Head: Normocephalic.  Right Ear: Hearing normal.  Left Ear: Hearing normal.  Nose: Nose normal.  Mouth/Throat: Oropharynx is clear and moist and mucous membranes are normal.  Neck: Trachea normal. Carotid bruit is not present. No thyroid mass and no thyromegaly present.  Cardiovascular: Normal rate, regular rhythm and normal pulses. Exam reveals no gallop, no distant heart sounds and no friction rub.  No murmur Lam. No peripheral edema  Pulmonary/Chest: Effort  normal and breath sounds normal. No respiratory distress.  Musculoskeletal:       Cervical back: He exhibits normal range of motion.       Thoracic back: He exhibits tenderness. He exhibits normal range of motion and no bony tenderness.       Lumbar back: He exhibits normal range of motion and no tenderness.  Neurological: He is oriented to person, place, and time. He has normal strength. No sensory deficit.  Skin: Skin is warm, dry and intact. No rash noted.  Psychiatric: He has a normal mood and affect. His speech is normal and behavior is normal. Thought content normal.          Assessment & Plan:

## 2018-01-23 NOTE — Patient Instructions (Addendum)
Start thoracic back stretches.  Use muscle relaxant as needed  at night.  heat , massage.  Call if not improving in 2 weeks.. For consideration X-ray.

## 2018-01-29 ENCOUNTER — Ambulatory Visit (INDEPENDENT_AMBULATORY_CARE_PROVIDER_SITE_OTHER): Payer: Medicare Other | Admitting: *Deleted

## 2018-01-29 DIAGNOSIS — R002 Palpitations: Secondary | ICD-10-CM

## 2018-01-29 NOTE — Progress Notes (Signed)
[facility] Summary Report / Loop Recorder 

## 2018-02-04 ENCOUNTER — Other Ambulatory Visit: Payer: Self-pay | Admitting: Family Medicine

## 2018-02-05 NOTE — Telephone Encounter (Signed)
Last office visit 01/23/2018 for back pain.  Last refilled 01/23/2018 for #15 with no refills.  Ok to refill?

## 2018-02-06 ENCOUNTER — Other Ambulatory Visit: Payer: Self-pay | Admitting: Family Medicine

## 2018-02-06 ENCOUNTER — Other Ambulatory Visit: Payer: Self-pay | Admitting: Cardiology

## 2018-02-09 ENCOUNTER — Ambulatory Visit (INDEPENDENT_AMBULATORY_CARE_PROVIDER_SITE_OTHER)
Admission: RE | Admit: 2018-02-09 | Discharge: 2018-02-09 | Disposition: A | Discharge status: Home / Self Care | Payer: Medicare Other | Source: Ambulatory Visit | Attending: Family Medicine | Admitting: Family Medicine

## 2018-02-09 ENCOUNTER — Ambulatory Visit (INDEPENDENT_AMBULATORY_CARE_PROVIDER_SITE_OTHER): Payer: Medicare Other | Admitting: Family Medicine

## 2018-02-09 ENCOUNTER — Encounter: Payer: Self-pay | Admitting: Family Medicine

## 2018-02-09 VITALS — BP 126/80 | HR 76 | Temp 97.9°F | Ht 69.0 in | Wt 171.0 lb

## 2018-02-09 DIAGNOSIS — M546 Pain in thoracic spine: Secondary | ICD-10-CM

## 2018-02-09 DIAGNOSIS — M8080XS Other osteoporosis with current pathological fracture, unspecified site, sequela: Secondary | ICD-10-CM

## 2018-02-09 DIAGNOSIS — I2510 Atherosclerotic heart disease of native coronary artery without angina pectoris: Secondary | ICD-10-CM

## 2018-02-09 MED ORDER — DICLOFENAC SODIUM 75 MG PO TBEC: 75.0000 mg | DELAYED_RELEASE_TABLET | Freq: Two times a day (BID) | ORAL | 0 refills | Status: DC

## 2018-02-09 NOTE — Patient Instructions (Addendum)
We will call with X-ray results finals.  Restart for a short time diclofenac twice daily.

## 2018-02-09 NOTE — Progress Notes (Signed)
   Subjective:    Patient ID: Shawn Lam, male    DOB: Jun 11, 1950, 67 y.o.   MRN: [id number]  HPI   67 year old male presents for follow up thoracic back pain.  Seen 01/23/2018 for mid back pain, burning, no radiation worse when up on feet.  No better with  muscle relaxant.  no new injury,  Pain is only present when standing up for long hours at new job.  Hx of thoracic compression fracture, osteoporosis, khyphoplasty 1 year ago.  No numbness, no weakness, no SOB,m no chest pain Review of Systems  Constitutional: Negative for fatigue and fever.  HENT: Negative for ear pain.   Eyes: Negative for pain.  Respiratory: Negative for cough and shortness of breath.   Cardiovascular: Negative for chest pain, palpitations and leg swelling.  Gastrointestinal: Negative for abdominal pain.  Genitourinary: Negative for dysuria.  Musculoskeletal: Negative for arthralgias.  Neurological: Negative for syncope, light-headedness and headaches.  Psychiatric/Behavioral: Negative for dysphoric mood.       Objective:   Physical Exam  Constitutional: He is oriented to person, place, and time. Vital signs are normal. He appears well-developed and well-nourished.  HENT:  Head: Normocephalic.  Right Ear: Hearing normal.  Left Ear: Hearing normal.  Nose: Nose normal.  Mouth/Throat: Oropharynx is clear and moist and mucous membranes are normal.  Neck: Trachea normal. Carotid bruit is not present. No thyroid mass and no thyromegaly present.  Cardiovascular: Normal rate, regular rhythm and normal pulses. Exam reveals no gallop, no distant heart sounds and no friction rub.  No murmur heard. No peripheral edema  Pulmonary/Chest: Effort normal and breath sounds normal. No respiratory distress.  Musculoskeletal:       Cervical back: He exhibits normal range of motion.       Thoracic back: He exhibits tenderness. He exhibits normal range of motion and no bony tenderness.       Lumbar back: He exhibits  normal range of motion and no tenderness.  Focal ttp at T9-10 vertebrae  Neurological: He is oriented to person, place, and time. He has normal strength. No sensory deficit.  Skin: Skin is warm, dry and intact. No rash noted.  Psychiatric: He has a normal mood and affect. His speech is normal and behavior is normal. Thought content normal.          Assessment & Plan:

## 2018-02-12 DIAGNOSIS — S22000K Wedge compression fracture of unspecified thoracic vertebra, subsequent encounter for fracture with nonunion: Secondary | ICD-10-CM

## 2018-02-12 DIAGNOSIS — M546 Pain in thoracic spine: Secondary | ICD-10-CM

## 2018-02-13 ENCOUNTER — Telehealth: Payer: Self-pay | Admitting: Family Medicine

## 2018-02-13 ENCOUNTER — Ambulatory Visit (INDEPENDENT_AMBULATORY_CARE_PROVIDER_SITE_OTHER): Payer: Medicare Other

## 2018-02-13 VITALS — BP 118/60 | HR 73 | Temp 98.4°F | Ht 69.25 in | Wt 166.2 lb

## 2018-02-13 DIAGNOSIS — E7800 Pure hypercholesterolemia, unspecified: Secondary | ICD-10-CM

## 2018-02-13 DIAGNOSIS — E039 Hypothyroidism, unspecified: Secondary | ICD-10-CM | POA: Diagnosis not present

## 2018-02-13 DIAGNOSIS — Z125 Encounter for screening for malignant neoplasm of prostate: Secondary | ICD-10-CM

## 2018-02-13 DIAGNOSIS — E559 Vitamin D deficiency, unspecified: Secondary | ICD-10-CM | POA: Diagnosis not present

## 2018-02-13 DIAGNOSIS — Z0000 Encounter for general adult medical examination without abnormal findings: Secondary | ICD-10-CM

## 2018-02-13 DIAGNOSIS — M8080XS Other osteoporosis with current pathological fracture, unspecified site, sequela: Secondary | ICD-10-CM

## 2018-02-13 DIAGNOSIS — D539 Nutritional anemia, unspecified: Secondary | ICD-10-CM

## 2018-02-13 LAB — LIPID PANEL
CHOL/HDL RATIO: 2
Cholesterol: 174 mg/dL (ref 0–200)
HDL: 70.7 mg/dL (ref >39.00)
LDL CALC: 92 mg/dL (ref 0–99)
NonHDL: 102.84
TRIGLYCERIDES: 55 mg/dL (ref 0.0–149.0)
VLDL: 11 mg/dL (ref 0.0–40.0)

## 2018-02-13 LAB — COMPREHENSIVE METABOLIC PANEL
ALT: 16 U/L (ref 0–53)
AST: 27 U/L (ref 0–37)
Albumin: 4.1 g/dL (ref 3.5–5.2)
Alkaline Phosphatase: 74 U/L (ref 39–117)
BUN: 13 mg/dL (ref 6–23)
CALCIUM: 9.4 mg/dL (ref 8.4–10.5)
CO2: 28 meq/L (ref 19–32)
Chloride: 105 mEq/L (ref 96–112)
Creatinine, Ser: 1.46 mg/dL (ref 0.40–1.50)
GFR: 51.09 mL/min — AB (ref >60.00)
GLUCOSE: 106 mg/dL — AB (ref 70–99)
Potassium: 5 mEq/L (ref 3.5–5.1)
Sodium: 140 mEq/L (ref 135–145)
Total Bilirubin: 0.8 mg/dL (ref 0.2–1.2)
Total Protein: 6.2 g/dL (ref 6.0–8.3)

## 2018-02-13 LAB — VITAMIN D 25 HYDROXY (VIT D DEFICIENCY, FRACTURES): VITD: 38.45 ng/mL (ref 30.00–100.00)

## 2018-02-13 LAB — PSA, MEDICARE: PSA: 0.64 ng/mL (ref 0.10–4.00)

## 2018-02-13 LAB — TSH: TSH: 5.08 u[IU]/mL — ABNORMAL HIGH (ref 0.35–4.50)

## 2018-02-13 LAB — T4, FREE: Free T4: 0.77 ng/dL (ref 0.60–1.60)

## 2018-02-13 LAB — T3, FREE: T3 FREE: 3.5 pg/mL (ref 2.3–4.2)

## 2018-02-13 NOTE — Patient Instructions (Signed)
Mr. Sortino , Thank you for taking time to come for your Medicare Wellness Visit. I appreciate your ongoing commitment to your health goals. Please review the following plan we discussed and let me know if I can assist you in the future.   These are the goals we discussed: Goals    . DIET - EAT MORE FRUITS AND VEGETABLES     Starting 02/13/2018, I will attempt to eat at least 2 servings of fresh fruits and vegetables daily.        This is a list of the screening recommended for you and due dates:  Health Maintenance  Topic Date Due  . Colon Cancer Screening  04/11/2019*  . Tetanus Vaccine  04/11/2019*  . Flu Shot  Completed  .  Hepatitis C: One time screening is recommended by Center for Disease Control  (CDC) for  adults born from [birth year] through 1965.   Completed  . Pneumonia vaccines  Completed  *Topic was postponed. The date shown is not the original due date.   Preventive Care for Adults  A healthy lifestyle and preventive care can promote health and wellness. Preventive health guidelines for adults include the following key practices.  . A routine yearly physical is a good way to check with your health care provider about your health and preventive screening. It is a chance to share any concerns and updates on your health and to receive a thorough exam.  . Visit your dentist for a routine exam and preventive care every 6 months. Brush your teeth twice a day and floss once a day. Good oral hygiene prevents tooth decay and gum disease.  . The frequency of eye exams is based on your age, health, family medical history, use  of contact lenses, and other factors. Follow your health care provider's recommendations for frequency of eye exams.  . Eat a healthy diet. Foods like vegetables, fruits, whole grains, low-fat dairy products, and lean protein foods contain the nutrients you need without too many calories. Decrease your intake of foods high in solid fats, added sugars, and salt. Eat  the right amount of calories for you. Get information about a proper diet from your health care provider, if necessary.  . Regular physical exercise is one of the most important things you can do for your health. Most adults should get at least 150 minutes of moderate-intensity exercise (any activity that increases your heart rate and causes you to sweat) each week. In addition, most adults need muscle-strengthening exercises on 2 or more days a week.  Silver Sneakers may be a benefit available to you. To determine eligibility, you may visit the website: www.silversneakers.com or contact program at 938-437-0331 Mon-Fri between 8AM-8PM.   . Maintain a healthy weight. The body mass index (BMI) is a screening tool to identify possible weight problems. It provides an estimate of body fat based on height and weight. Your health care provider can find your BMI and can help you achieve or maintain a healthy weight.   For adults 20 years and older: ? A BMI below 18.5 is considered underweight. ? A BMI of 18.5 to 24.9 is normal. ? A BMI of 25 to 29.9 is considered overweight. ? A BMI of 30 and above is considered obese.   . Maintain normal blood lipids and cholesterol levels by exercising and minimizing your intake of saturated fat. Eat a balanced diet with plenty of fruit and vegetables. Blood tests for lipids and cholesterol should begin at age 50  and be repeated every 5 years. If your lipid or cholesterol levels are high, you are over 50, or you are at high risk for heart disease, you may need your cholesterol levels checked more frequently. Ongoing high lipid and cholesterol levels should be treated with medicines if diet and exercise are not working.  . If you smoke, find out from your health care provider how to quit. If you do not use tobacco, please do not start.  . If you choose to drink alcohol, please do not consume more than 2 drinks per day. One drink is considered to be 12 ounces (355 mL)  of beer, 5 ounces (148 mL) of wine, or 1.5 ounces (44 mL) of liquor.  . If you are 61-20 years old, ask your health care provider if you should take aspirin to prevent strokes.  . Use sunscreen. Apply sunscreen liberally and repeatedly throughout the day. You should seek shade when your shadow is shorter than you. Protect yourself by wearing long sleeves, pants, a wide-brimmed hat, and sunglasses year round, whenever you are outdoors.  . Once a month, do a whole body skin exam, using a mirror to look at the skin on your back. Tell your health care provider of new moles, moles that have irregular borders, moles that are larger than a pencil eraser, or moles that have changed in shape or color.

## 2018-02-13 NOTE — Telephone Encounter (Signed)
-----   Message from Eustace Pen, LPN sent at 74/11/2705  2:03 PM EST ----- Regarding: Labs 11/5 Lab orders needed. Thank you.  Insurance:  [employer]

## 2018-02-14 ENCOUNTER — Ambulatory Visit: Payer: Medicare Other

## 2018-02-14 DIAGNOSIS — L812 Freckles: Secondary | ICD-10-CM | POA: Diagnosis not present

## 2018-02-14 DIAGNOSIS — D692 Other nonthrombocytopenic purpura: Secondary | ICD-10-CM | POA: Diagnosis not present

## 2018-02-14 DIAGNOSIS — L821 Other seborrheic keratosis: Secondary | ICD-10-CM | POA: Diagnosis not present

## 2018-02-14 DIAGNOSIS — L578 Other skin changes due to chronic exposure to nonionizing radiation: Secondary | ICD-10-CM | POA: Diagnosis not present

## 2018-02-14 DIAGNOSIS — L570 Actinic keratosis: Secondary | ICD-10-CM | POA: Diagnosis not present

## 2018-02-14 DIAGNOSIS — D229 Melanocytic nevi, unspecified: Secondary | ICD-10-CM | POA: Diagnosis not present

## 2018-02-14 DIAGNOSIS — Z1283 Encounter for screening for malignant neoplasm of skin: Secondary | ICD-10-CM | POA: Diagnosis not present

## 2018-02-14 DIAGNOSIS — L509 Urticaria, unspecified: Secondary | ICD-10-CM | POA: Diagnosis not present

## 2018-02-14 DIAGNOSIS — D1800 Hemangioma unspecified site: Secondary | ICD-10-CM | POA: Diagnosis not present

## 2018-02-14 DIAGNOSIS — L309 Dermatitis, unspecified: Secondary | ICD-10-CM | POA: Diagnosis not present

## 2018-02-14 DIAGNOSIS — Z85828 Personal history of other malignant neoplasm of skin: Secondary | ICD-10-CM | POA: Diagnosis not present

## 2018-02-15 NOTE — Progress Notes (Signed)
Subjective:   Shawn Lam is a 67 y.o. male who presents for Medicare Annual/Subsequent preventive examination.  Review of Systems:  N/A Cardiac Risk Factors include: advanced age (>67men, >58 women);male gender;dyslipidemia     Objective:    Vitals: BP 118/60 (BP Location: Left Arm, Patient Position: Sitting, Cuff Size: Normal)   Pulse 73   Temp 98.4 F (36.9 C) (Oral)   Ht 5' 9.25" (1.759 m) Comment: no shoes  Wt 166 lb 4 oz (75.4 kg)   SpO2 97%   BMI 24.37 kg/m   Body mass index is 24.37 kg/m.  Advanced Directives 02/13/2018 12/08/2017 12/01/2017 10/20/2017 10/16/2017 08/19/2016 05/13/2016  Does Patient Have a Medical Advance Directive? No No Yes Yes Yes No No  Type of Advance Directive - Public librarian;Living will Living will Living will - -  Does patient want to make changes to medical advance directive? - - - - No - Patient declined - -  Copy of [place] in Chart? - - No - copy requested No - copy requested No - copy requested - -  Would patient like information on creating a medical advance directive? No - Patient declined No - Patient declined - - - - No - Patient declined  Some encounter information is confidential and restricted. Go to Review Flowsheets activity to see all data.    Tobacco Social History   Tobacco Use  Smoking Status Never Smoker  Smokeless Tobacco Never Used     Counseling given: No   Clinical Intake:  Pre-visit preparation completed: Yes  Pain : No/denies pain Pain Score: 0-No pain     Nutritional Status: BMI 25 -29 Overweight Nutritional Risks: None Diabetes: No  How often do you need to have someone help you when you read instructions, pamphlets, or other written materials from your doctor or pharmacy?: 1 - Never What is the last grade level you completed in school?: 12th grade  Interpreter Needed?: No  Comments: pt lives with spouse Information entered by :: LPinson, LPN  Past Medical  History:  Diagnosis Date  . Anemia   . Anxiety   . Asymptomatic LV dysfunction    50-55% by echo 06/2016  . Berger's disease   . CAD (coronary artery disease) 06/2005   PCI of LAD and RCA  . Depression   . Dilated aortic root (HCC)    4cm at sinus of Valsalva  . Hematuria   . Hyperlipidemia   . Hypothyroidism   . Insomnia   . Left ureteral stone   . Orthostatic hypotension    negative tilt table  . OSA (obstructive sleep apnea)    moderate with AHI 17/hr, uses CPAP nightly  . PVC's (premature ventricular contractions) 07/07/2016  . Renal disorder    PT IS SEEING DR. Lorrene Reid- NEPHROLOGIST  . Rosacea   . Urticaria    Past Surgical History:  Procedure Laterality Date  . CARDIAC CATHETERIZATION  09/06/2013   [place]  . CARDIAC CATHETERIZATION    . CORONARY ANGIOPLASTY  2004   STENT PLAC[facility]  . CYSTOSCOPY WITH RETROGRADE PYELOGRAM, URETEROSCOPY AND STENT PLACEMENT Left 03/19/2014   Procedure: CYSTOSCOPY WITH RETROGRADE PYELOGRAM, URETEROSCOPY AND STENT PLACEMENT;  Surgeon: Junious Dresser, MD;  Location: [facility];  Service: Urology;  Laterality: Left;  . CYSTOSCOPY WITH RETROGRADE PYELOGRAM, URETEROSCOPY AND STENT PLACEMENT Bilateral 05/20/2015   Procedure:  CYSTOSCOPY WITH BILATERAL RETROGRADE PYELOGRAM,RIGHT  DIAGNOSTIC URETEROSCOPY ,LEFT URETEROSCOPY WITH HOLMIUM LASER  AND BILATERAL  STENT PLACEMENT ;  Surgeon: Alexis Frock, MD;  Location: [facility];  Service: Urology;  Laterality: Bilateral;  . DRUG INDUCED ENDOSCOPY N/A 10/20/2017   Procedure: DRUG INDUCED SLEEP ENDOSCOPY;  Surgeon: Melida Quitter, MD;  Location: [facility];  Service: ENT;  Laterality: N/A;  . EP IMPLANTABLE DEVICE N/A 05/13/2016   Procedure: Loop Recorder Insertion;  Surgeon: Deboraha Sprang, MD;  Location: [facility];  Service: Cardiovascular;  Laterality: N/A;  . HOLMIUM LASER APPLICATION Bilateral 10/14/1605   Procedure: HOLMIUM LASER APPLICATION;  Surgeon: Alexis Frock, MD;  Location: [facility];   Service: Urology;  Laterality: Bilateral;  . IMPLIMENTATION OF A HYPOGLOSSAL NERVE STIMULATOR  12/08/2017   [facility]   . LEFT HEART CATHETERIZATION WITH CORONARY ANGIOGRAM N/A 09/06/2013   Procedure: LEFT HEART CATHETERIZATION WITH CORONARY ANGIOGRAM;  Surgeon: Sinclair Grooms, MD;  Location: [facility];  Service: Cardiovascular;  Laterality: N/A;  . LEFT KNEE CAP  SURGERY ABOUT 40 YRS AGO  ? 1970    . STONE EXTRACTION WITH BASKET Left 03/19/2014   Procedure: STONE EXTRACTION WITH BASKET;  Surgeon: Junious Dresser, MD;  Location: [facility];  Service: Urology;  Laterality: Left;   Family History  Problem Relation Age of Onset  . Coronary artery disease Mother   . Heart attack Mother   . Heart disease Mother   . Hypertension Mother   . Cancer Father        lung and colon  . Lung cancer Father        smoker  . Diabetes Brother    Social History   Socioeconomic History  . Marital status: Married    Spouse name: Not on file  . Number of children: Not on file  . Years of education: Not on file  . Highest education level: Not on file  Occupational History  . Occupation: retired Designer, jewellery  . Financial resource strain: Not on file  . Food insecurity:    Worry: Not on file    Inability: Not on file  . Transportation needs:    Medical: Not on file    Non-medical: Not on file  Tobacco Use  . Smoking status: Never Smoker  . Smokeless tobacco: Never Used  Substance and Sexual Activity  . Alcohol use: Yes    Alcohol/week: 1.0 standard drinks    Types: 1 Cans of beer per week    Comment: Occasional beer  . Drug use: No  . Sexual activity: Yes    Partners: Female    Birth control/protection: None  Lifestyle  . Physical activity:    Days per week: Not on file    Minutes per session: Not on file  . Stress: Not on file  Relationships  . Social connections:    Talks on phone: Not on file    Gets together: Not on file    Attends religious service: Not on file     Active member of club or organization: Not on file    Attends meetings of clubs or organizations: Not on file    Relationship status: Not on file  Other Topics Concern  . Not on file  Social History Narrative   Regular exercise--yes--jog 5 miles 6 days a week    Outpatient Encounter Medications as of 02/13/2018  Medication Sig  . aspirin EC 81 MG tablet Take 81 mg by mouth at bedtime.   [name] atorvastatin (LIPITOR) 40 MG tablet TAKE 1 TABLET BY  MOUTH DAILY. (Patient taking differently: Take 40 mg by mouth daily. )  . clopidogrel (PLAVIX) 75 MG tablet Take 1 tablet (75 mg total) by mouth daily.  . cyclobenzaprine (FLEXERIL) 10 MG tablet TAKE 0.5-1 TABLETS (5-10 MG TOTAL) BY MOUTH AT BEDTIME AS NEEDED FOR MUSCLE SPASMS.  [name] diclofenac (VOLTAREN) 75 MG EC tablet Take 1 tablet (75 mg total) by mouth 2 (two) times daily.  . ferrous sulfate 325 (65 FE) MG tablet TAKE 1 TABLET BY MOUTH 3 TIMES DAILY WITH MEALS. (Patient taking differently: Take 325 mg by mouth 3 (three) times daily. )  . fludrocortisone (FLORINEF) 0.1 MG tablet Take 1 tablet (0.1 mg total) by mouth daily.  [name] FLUoxetine (PROZAC) 20 MG capsule TAKE 1 CAPSULE DAILY. TAKE WITH 40MG  TO EQUAL 60MG     ONCE DAILY  . FLUoxetine (PROZAC) 40 MG capsule TAKE 1 CAPSULE EVERY       MORNING  . furosemide (LASIX) 20 MG tablet TAKE 1 TABLET BY MOUTH EVERY DAY  . levothyroxine (SYNTHROID, LEVOTHROID) 25 MCG tablet TAKE 1 TABLET DAILY BEFORE BREAKFAST  . midodrine (PROAMATINE) 5 MG tablet TAKE 1 TABLET (5 MG TOTAL) BY MOUTH 2 (TWO) TIMES DAILY WITH BREAKFAST AND LUNCH. (Patient taking differently: Take 5 mg by mouth 2 (two) times daily. )  . mirtazapine (REMERON) 15 MG tablet Take 1 tablet (15 mg total) by mouth at bedtime.  [name] NITROSTAT 0.4 MG SL tablet PLACE 1 TABLET (0.4 MG TOTAL) UNDER THE TONGUE EVERY 5 (FIVE) MINUTES AS NEEDED FOR CHEST PAIN. (Patient taking differently: Place 0.4 mg under the tongue every 5 (five) minutes as needed for chest pain. )    . Teriparatide, Recombinant, (FORTEO) 600 MCG/2.4ML SOLN Inject 20 mcg daily into the skin.   [name] vitamin B-12 (CYANOCOBALAMIN) 1000 MCG tablet Take 1,000 mcg by mouth daily.    No facility-administered encounter medications on file as of 02/13/2018.     Activities of Daily Living In your present state of health, do you have any difficulty performing the following activities: 02/13/2018 12/08/2017  Hearing? N N  Vision? N N  Difficulty concentrating or making decisions? N N  Walking or climbing stairs? N N  Dressing or bathing? N N  Doing errands, shopping? N N  Preparing Food and eating ? N -  Using the Toilet? N -  In the past six months, have you accidently leaked urine? N -  Do you have problems with loss of bowel control? N -  Managing your Medications? N -  Managing your Finances? N -  Housekeeping or managing your Housekeeping? N -  Some recent data might be hidden    Patient Care Team: Jinny Sanders, MD as PCP - General Sueanne Margarita, MD as PCP - Cardiology (Cardiology)   Assessment:   This is a routine wellness examination for Gwyndolyn Saxon.   Hearing Screening   125Hz  250Hz  500Hz  1000Hz  2000Hz  3000Hz  4000Hz  6000Hz  8000Hz   Right ear:   0 40 40  0    Left ear:   0 40 40  0    Vision Screening Comments: Vision exam in August 2019 with Dr. Rosalva Ferron  Exercise Activities and Dietary recommendations Current Exercise Habits: Home exercise routine, Type of exercise: Other - see comments(jogging), Time (Minutes): 60, Frequency (Times/Week): 6, Weekly Exercise (Minutes/Week): 360, Intensity: Moderate, Exercise limited by: None identified  Goals    . DIET - EAT MORE FRUITS AND VEGETABLES     Starting 02/13/2018, I will attempt to  eat at least 2 servings of fresh fruits and vegetables daily.        Fall Risk Fall Risk  02/13/2018 02/10/2017 02/09/2016 01/31/2013  Falls in the past year? 0 No No Yes  Number falls in past yr: - - - 1  Injury with Fall? - - - No    Depression Screen PHQ 2/9 Scores 02/13/2018 02/10/2017 02/09/2016 01/31/2013  PHQ - 2 Score 0 0 6 0  PHQ- 9 Score 0 - 19 -  Some encounter information is confidential and restricted. Go to Review Flowsheets activity to see all data.    Cognitive Function MMSE - Mini Mental State Exam 02/13/2018  Orientation to time 5  Orientation to Place 5  Registration 3  Attention/ Calculation 0  Recall 2  Recall-comments unable to recall 1 of 3 words  Language- name 2 objects 0  Language- repeat 1  Language- follow 3 step command 3  Language- read & follow direction 0  Write a sentence 0  Copy design 0  Total score 19     PLEASE NOTE: A Mini-Cog screen was completed. Maximum score is 20. A value of 0 denotes this part of Folstein MMSE was not completed or the patient failed this part of the Mini-Cog screening.   Mini-Cog Screening Orientation to Time - Max 5 pts Orientation to Place - Max 5 pts Registration - Max 3 pts Recall - Max 3 pts Language Repeat - Max 1 pts Language Follow 3 Step Command - Max 3 pts     Immunization History  Administered Date(s) Administered  . Influenza Split 01/04/2011, 01/27/2012  . Influenza Whole 01/09/2008, 03/04/2009, 12/27/2009  . Influenza,inj,Quad PF,6+ Mos 01/31/2013, 01/31/2014, 02/03/2015, 02/09/2016, 01/20/2017, 01/23/2018  . Pneumococcal Conjugate-13 02/09/2016  . Pneumococcal Polysaccharide-23 02/23/2017  . Td 06/05/2007  . Zoster 01/25/2011   Screening Tests Health Maintenance  Topic Date Due  . COLONOSCOPY  04/11/2019 (Originally 08/21/2017)  . TETANUS/TDAP  04/11/2019 (Originally 06/04/2017)  . INFLUENZA VACCINE  Completed  . Hepatitis C Screening  Completed  . PNA vac Low Risk Adult  Completed     Plan:     I have personally reviewed, addressed, and noted the following in the patient's chart:  A. Medical and social history B. Use of alcohol, tobacco or illicit drugs  C. Current medications and supplements D. Functional ability  and status E.  Nutritional status F.  Physical activity G. Advance directives H. List of other physicians I.  Hospitalizations, surgeries, and ER visits in previous 12 months J.  [place] to include hearing, vision, cognitive, depression L. Referrals and appointments - none  In addition, I have reviewed and discussed with patient certain preventive protocols, quality metrics, and best practice recommendations. A written personalized care plan for preventive services as well as general preventive health recommendations were provided to patient.  See attached scanned questionnaire for additional information.   Signed,   Lindell Noe, MHA, BS, LPN Health Coach

## 2018-02-15 NOTE — Progress Notes (Signed)
PCP notes:   Health maintenance:  Colonoscopy - addressed Tetanus vaccine - postponed/insurance  Abnormal screenings:   Hearing - failed  Hearing Screening   125Hz  250Hz  500Hz  1000Hz  2000Hz  3000Hz  4000Hz  6000Hz  8000Hz   Right ear:   0 40 40  0    Left ear:   0 40 40  0     Mini-Cog score: 19 MMSE - Mini Mental State Exam 02/13/2018  Orientation to time 5  Orientation to Place 5  Registration 3  Attention/ Calculation 0  Recall 2  Recall-comments unable to recall 1 of 3 words  Language- name 2 objects 0  Language- repeat 1  Language- follow 3 step command 3  Language- read & follow direction 0  Write a sentence 0  Copy design 0  Total score 19    Patient concerns:   None  Nurse concerns:  None  Next PCP appt:   02/22/18 @ 1030

## 2018-02-16 ENCOUNTER — Telehealth: Payer: Self-pay

## 2018-02-16 LAB — CUP PACEART REMOTE DEVICE CHECK
Date Time Interrogation Session: 20191020043606
Implantable Pulse Generator Implant Date: 20180202

## 2018-02-16 NOTE — Progress Notes (Signed)
I reviewed health advisor's note, was available for consultation, and agree with documentation and plan.  

## 2018-02-16 NOTE — Telephone Encounter (Signed)
Left message for the patient to discuss referral

## 2018-02-20 ENCOUNTER — Other Ambulatory Visit: Payer: Self-pay | Admitting: Family Medicine

## 2018-02-22 ENCOUNTER — Ambulatory Visit (INDEPENDENT_AMBULATORY_CARE_PROVIDER_SITE_OTHER): Payer: Medicare Other | Admitting: Family Medicine

## 2018-02-22 ENCOUNTER — Encounter: Payer: Self-pay | Admitting: Family Medicine

## 2018-02-22 DIAGNOSIS — M8080XS Other osteoporosis with current pathological fracture, unspecified site, sequela: Secondary | ICD-10-CM | POA: Diagnosis not present

## 2018-02-22 DIAGNOSIS — E7800 Pure hypercholesterolemia, unspecified: Secondary | ICD-10-CM

## 2018-02-22 DIAGNOSIS — I2510 Atherosclerotic heart disease of native coronary artery without angina pectoris: Secondary | ICD-10-CM

## 2018-02-22 DIAGNOSIS — E039 Hypothyroidism, unspecified: Secondary | ICD-10-CM | POA: Diagnosis not present

## 2018-02-22 DIAGNOSIS — S22000K Wedge compression fracture of unspecified thoracic vertebra, subsequent encounter for fracture with nonunion: Secondary | ICD-10-CM

## 2018-02-22 DIAGNOSIS — M79644 Pain in right finger(s): Secondary | ICD-10-CM | POA: Diagnosis not present

## 2018-02-22 DIAGNOSIS — G8929 Other chronic pain: Secondary | ICD-10-CM | POA: Insufficient documentation

## 2018-02-22 MED ORDER — ALENDRONATE SODIUM 70 MG PO TABS: 70.0000 mg | ORAL_TABLET | ORAL | 11 refills | Status: DC

## 2018-02-22 MED ORDER — ATORVASTATIN CALCIUM 80 MG PO TABS: 80.0000 mg | ORAL_TABLET | Freq: Every day | ORAL | 3 refills | Status: DC

## 2018-02-22 NOTE — Assessment & Plan Note (Signed)
No clear sign of fracute.Sharren Bridge [facility] OA.. Treat with brace... Consider voltaren gel if not improving as expected.

## 2018-02-22 NOTE — Patient Instructions (Addendum)
Start alendronate.  Keep appt woith Dr. Ernestina Patches about back pain.  Keep working on low chol and low carb diet.  Increase atorvastatin 80 mg daily.  Return for chol re-check in 3 months.  Call to set up colon cancer screening.

## 2018-02-22 NOTE — Progress Notes (Signed)
Subjective:    Patient ID: Shawn Lam, male    DOB: 11-Mar-1951, 66 y.o.   MRN: [id number]  HPI   The patient presents for complete physical and review of chronic health problems. He/She also has the following acute concerns today:  Health maintenance:  Colonoscopy - addressed Tetanus vaccine - postponed/insurance  Abnormal screenings:   Hearing - failed             Hearing Screening   125Hz  250Hz  500Hz  1000Hz  2000Hz  3000Hz  4000Hz  6000Hz  8000Hz   Right ear:   0 40 40  0    Left ear:   0 40 40  0     Mini-Cog score: 19 MMSE - Mini Mental State Exam 02/13/2018  Orientation to time 5  Orientation to Place 5  Registration 3  Attention/ Calculation 0  Recall 2  Recall-comments unable to recall 1 of 3 words  Language- name 2 objects 0  Language- repeat 1  Language- follow 3 step command 3  Language- read & follow direction 0  Write a sentence 0  Copy design 0  Total score 19    He has not noticed memory issue. 02/22/18 today:   Thoracic back pain: No improvement in pain.  X-ray  Questionable new compression fracture T11  Referred to Dr. Ernestina Patches 03/06/2018  Hx of compression fracture.   Was on Forteo since 2018.  Stopped given Forteo given cost.  Last bone density 05/2017 showed osteopenia.  Did notice a popping in right base of thumb.. Since then very painful. No swelling, no bruising.  Elevated Cholesterol:  Not at goal <70 on statin. No change in diet. Lab Results  Component Value Date   CHOL 174 02/13/2018   HDL 70.70 02/13/2018   LDLCALC 92 02/13/2018   LDLDIRECT 57 06/22/2009   TRIG 55.0 02/13/2018   CHOLHDL 2 02/13/2018  Using medications without problems:none Muscle aches: none Diet compliance: healthy Exercise:limited with back. Other complaints:  Prediabetes:  glucose 106 this year Lab Results  Component Value Date   HGBA1C 5.1 08/11/2016   CAD, PVCs:  Followed by cardiology No chest pain., no SOB, no edema.   IgA  nephropathy: Followed by renal. Stable, slightly improved   Hypothyroid: stable at recent check.     Social History /Family History/Past Medical History reviewed in detail and updated in EMR if needed. Blood pressure 130/80, pulse 80, temperature 97.6 F (36.4 C), temperature source Oral, height 5' 9.25" (1.759 m), weight 171 lb 4 oz (77.7 kg).  Body mass index is 25.11 kg/m.  Review of Systems  Constitutional: Negative for fatigue and fever.  HENT: Negative for ear pain.   Eyes: Negative for pain.  Respiratory: Negative for cough and shortness of breath.   Cardiovascular: Negative for chest pain, palpitations and leg swelling.  Gastrointestinal: Negative for abdominal pain.  Genitourinary: Negative for dysuria.  Musculoskeletal: Positive for back pain. Negative for arthralgias.  Neurological: Negative for syncope, light-headedness and headaches.  Psychiatric/Behavioral: Negative for dysphoric mood.       Objective:   Physical Exam  Constitutional: Vital signs are normal. He appears well-developed and well-nourished.  Non-toxic appearance. He does not appear ill. No distress.  HENT:  Head: Normocephalic and atraumatic.  Right Ear: Hearing, tympanic membrane, external ear and ear canal normal.  Left Ear: Hearing, tympanic membrane, external ear and ear canal normal.  Nose: Nose normal.  Mouth/Throat: Uvula is midline, oropharynx is clear and moist and mucous membranes are normal.  Eyes: Pupils are  equal, round, and reactive to light. Conjunctivae, EOM and lids are normal. Lids are everted and swept, no foreign bodies found.  Neck: Trachea normal, normal range of motion and phonation normal. Neck supple. Carotid bruit is not present. No thyroid mass and no thyromegaly present.  Cardiovascular: Normal rate, regular rhythm, S1 normal, S2 normal, intact distal pulses and normal pulses. Exam reveals no gallop, no distant heart sounds and no friction rub.  No murmur heard. No  peripheral edema  Pulmonary/Chest: Effort normal and breath sounds normal. No respiratory distress. He has no wheezes. He has no rhonchi. He has no rales.  Abdominal: Soft. Normal appearance and bowel sounds are normal. There is no hepatosplenomegaly. There is no tenderness. There is no rebound, no guarding and no CVA tenderness. No hernia.  Lymphadenopathy:    He has no cervical adenopathy.  Neurological: He is alert. He has normal strength and normal reflexes. No cranial nerve deficit or sensory deficit. Gait normal.  Skin: Skin is warm, dry and intact. No rash noted.  Psychiatric: He has a normal mood and affect. His speech is normal and behavior is normal. Judgment and thought content normal.          Assessment & Plan:  The patient's preventative maintenance and recommended screening tests for an annual wellness exam were reviewed in full today. Brought up to date unless services declined.  Counselled on the importance of diet, exercise, and its role in overall health and mortality. The patient's FH and SH was reviewed, including their home life, tobacco status, and drug and alcohol status.   Vaccines: Uptodate, flu uptodate. Refused tdap Nonsmoker   Lab Results  Component Value Date   PSA 0.64 02/13/2018   PSA 0.74 02/06/2017   PSA 0.83 02/02/2016  Colon cancer screening: Colonoscopy 08/2012 nml, repeat in 5 years. Due now but holding on this given ISPIRE procedure Hep C: done with eval for increase LFTs.  HIV: neg test

## 2018-02-22 NOTE — Assessment & Plan Note (Signed)
2019 DEXa showed osteopenia after 1 year of forteo. Pt cannot afford.  ? Possible new compression fracture in  Back.. Start alendronate for now.

## 2018-02-22 NOTE — Assessment & Plan Note (Signed)
?   New.  ? Causing pain.   Referred to back specialist for further eval/treatment.

## 2018-02-22 NOTE — Assessment & Plan Note (Signed)
Well controlled. Continue current medication.  

## 2018-02-22 NOTE — Assessment & Plan Note (Signed)
Not at goal .. Increase statin to 80 mg daily.

## 2018-02-24 ENCOUNTER — Other Ambulatory Visit: Payer: Self-pay | Admitting: Family Medicine

## 2018-02-26 NOTE — Telephone Encounter (Signed)
Electronic refill request Flexeril Last refill 02/05/18 #15 Last office visit 02/22/18

## 2018-02-27 ENCOUNTER — Other Ambulatory Visit: Payer: Self-pay

## 2018-03-02 ENCOUNTER — Ambulatory Visit (INDEPENDENT_AMBULATORY_CARE_PROVIDER_SITE_OTHER): Payer: Medicare Other

## 2018-03-02 DIAGNOSIS — R002 Palpitations: Secondary | ICD-10-CM

## 2018-03-02 NOTE — Progress Notes (Signed)
[facility] Summary Report / Loop Recorder 

## 2018-03-06 ENCOUNTER — Ambulatory Visit (INDEPENDENT_AMBULATORY_CARE_PROVIDER_SITE_OTHER): Payer: Medicare Other | Admitting: Cardiology

## 2018-03-06 ENCOUNTER — Ambulatory Visit (INDEPENDENT_AMBULATORY_CARE_PROVIDER_SITE_OTHER): Payer: Medicare Other | Admitting: Physical Medicine and Rehabilitation

## 2018-03-06 ENCOUNTER — Encounter: Payer: Self-pay | Admitting: Cardiology

## 2018-03-06 ENCOUNTER — Telehealth (INDEPENDENT_AMBULATORY_CARE_PROVIDER_SITE_OTHER): Payer: Self-pay | Admitting: Physical Medicine and Rehabilitation

## 2018-03-06 ENCOUNTER — Encounter (INDEPENDENT_AMBULATORY_CARE_PROVIDER_SITE_OTHER): Payer: Self-pay | Admitting: Physical Medicine and Rehabilitation

## 2018-03-06 VITALS — BP 130/70 | HR 77 | Ht 69.25 in | Wt 172.8 lb

## 2018-03-06 VITALS — BP 130/87 | HR 87

## 2018-03-06 DIAGNOSIS — I2510 Atherosclerotic heart disease of native coronary artery without angina pectoris: Secondary | ICD-10-CM

## 2018-03-06 DIAGNOSIS — G4733 Obstructive sleep apnea (adult) (pediatric): Secondary | ICD-10-CM | POA: Diagnosis not present

## 2018-03-06 DIAGNOSIS — G8929 Other chronic pain: Secondary | ICD-10-CM | POA: Diagnosis not present

## 2018-03-06 DIAGNOSIS — I951 Orthostatic hypotension: Secondary | ICD-10-CM | POA: Diagnosis not present

## 2018-03-06 DIAGNOSIS — S22080A Wedge compression fracture of T11-T12 vertebra, initial encounter for closed fracture: Secondary | ICD-10-CM

## 2018-03-06 DIAGNOSIS — S32000D Wedge compression fracture of unspecified lumbar vertebra, subsequent encounter for fracture with routine healing: Secondary | ICD-10-CM

## 2018-03-06 DIAGNOSIS — E7800 Pure hypercholesterolemia, unspecified: Secondary | ICD-10-CM

## 2018-03-06 DIAGNOSIS — M7918 Myalgia, other site: Secondary | ICD-10-CM

## 2018-03-06 DIAGNOSIS — S22000D Wedge compression fracture of unspecified thoracic vertebra, subsequent encounter for fracture with routine healing: Secondary | ICD-10-CM

## 2018-03-06 DIAGNOSIS — M546 Pain in thoracic spine: Principal | ICD-10-CM

## 2018-03-06 DIAGNOSIS — M545 Low back pain: Secondary | ICD-10-CM

## 2018-03-06 NOTE — Progress Notes (Signed)
Cardiology Office Note:    Date:  03/06/2018   ID:  [name], DOB 01/12/1951, MRN [id number]  PCP:  Jinny Sanders, MD  Cardiologist:  Fransico Him, MD    Referring MD: Jinny Sanders, MD   Chief Complaint  Patient presents with  . Coronary Artery Disease  . Hyperlipidemia  . Hypertension  . Sleep Apnea    History of Present Illness:    Shawn Lam is a 67 y.o. male with a hx of ASCAD s/p PCI of the LAD and RCA, orthostatic hypotension, dyslipidemia. EF of 50-55% with inferior HK. He has hx of lymphedema and and lymphedema clinic in [place], mildly dilated aortic root at 38 mm by echo 2018, moderate OSA who had been on CPAP but was intolerant and then was referred to ENT for consideration of inspire device.  On 12/08/2017 he underwent placement of inspire device without complication.  He is now back for follow-up.  He is doing well since his device was implanted.  He is sleeping well at night and feels rested when he wakes up in the morning.  He does not have any significant daytime sleepiness.  He does not think he is snoring anymore.  Past Medical History:  Diagnosis Date  . Anemia   . Anxiety   . Asymptomatic LV dysfunction    50-55% by echo 06/2016  . Berger's disease   . CAD (coronary artery disease) 06/2005   PCI of LAD and RCA  . Depression   . Dilated aortic root (HCC)    4cm at sinus of Valsalva  . Hematuria   . Hyperlipidemia   . Hypothyroidism   . Insomnia   . Left ureteral stone   . Orthostatic hypotension    negative tilt table  . OSA (obstructive sleep apnea)    moderate with AHI 17/hr, uses CPAP nightly  . PVC's (premature ventricular contractions) 07/07/2016  . Renal disorder    PT IS SEEING DR. Lorrene Reid- NEPHROLOGIST  . Rosacea   . Urticaria     Past Surgical History:  Procedure Laterality Date  . CARDIAC CATHETERIZATION  09/06/2013   [place]  . CARDIAC CATHETERIZATION    . CORONARY ANGIOPLASTY  2004   STENT PLACEMENT  . CYSTOSCOPY WITH  RETROGRADE PYELOGRAM, URETEROSCOPY AND STENT PLACEMENT Left 03/19/2014   Procedure: CYSTOSCOPY WITH RETROGRADE PYELOGRAM, URETEROSCOPY AND STENT PLACEMENT;  Surgeon: Junious Dresser, MD;  Location: [facility];  Service: Urology;  Laterality: Left;  . CYSTOSCOPY WITH RETROGRADE PYELOGRAM, URETEROSCOPY AND STENT PLACEMENT Bilateral 05/20/2015   Procedure:  CYSTOSCOPY WITH BILATERAL RETROGRADE PYELOGRAM,RIGHT  DIAGNOSTIC URETEROSCOPY ,LEFT URETEROSCOPY WITH HOLMIUM LASER  AND BILATERAL  STENT PLACEMENT ;  Surgeon: Alexis Frock, MD;  Location: [facility];  Service: Urology;  Laterality: Bilateral;  . DRUG INDUCED ENDOSCOPY N/A 10/20/2017   Procedure: DRUG INDUCED SLEEP ENDOSCOPY;  Surgeon: Melida Quitter, MD;  Location: [place];  Service: ENT;  Laterality: N/A;  . EP IMPLANTABLE DEVICE N/A 05/13/2016   Procedure: Loop Recorder Insertion;  Surgeon: Deboraha Sprang, MD;  Location: [place] [facility];  Service: Cardiovascular;  Laterality: N/A;  . HOLMIUM LASER APPLICATION Bilateral 05/18/621   Procedure: HOLMIUM LASER APPLICATION;  Surgeon: Alexis Frock, MD;  Location: [facility];  Service: Urology;  Laterality: Bilateral;  . IMPLIMENTATION OF A HYPOGLOSSAL NERVE STIMULATOR  12/08/2017   [facility]   . LEFT HEART CATHETERIZATION WITH CORONARY ANGIOGRAM N/A 09/06/2013   Procedure: LEFT HEART CATHETERIZATION WITH CORONARY ANGIOGRAM;  Surgeon: Sinclair Grooms, MD;  Location: [facility];  Service: Cardiovascular;  Laterality: N/A;  . LEFT KNEE CAP  SURGERY ABOUT 40 YRS AGO  ? 1970    . STONE EXTRACTION WITH BASKET Left 03/19/2014   Procedure: STONE EXTRACTION WITH BASKET;  Surgeon: Junious Dresser, MD;  Location: [facility];  Service: Urology;  Laterality: Left;    Current Medications: Current Meds  Medication Sig  . alendronate (FOSAMAX) 70 MG tablet Take 1 tablet (70 mg total) by mouth every 7 (seven) days. Take with a full glass of water on an empty stomach.  [name] aspirin EC 81 MG tablet Take 81 mg by  mouth at bedtime.   [name] atorvastatin (LIPITOR) 80 MG tablet Take 1 tablet (80 mg total) by mouth daily.  . clopidogrel (PLAVIX) 75 MG tablet Take 1 tablet (75 mg total) by mouth daily.  . cyclobenzaprine (FLEXERIL) 10 MG tablet TAKE 0.5-1 TABLETS (5-10 MG TOTAL) BY MOUTH AT BEDTIME AS NEEDED FOR MUSCLE SPASMS.  [name] diclofenac (VOLTAREN) 75 MG EC tablet Take 1 tablet (75 mg total) by mouth 2 (two) times daily.  . ferrous sulfate 325 (65 FE) MG tablet TAKE 1 TABLET BY MOUTH 3 TIMES DAILY WITH MEALS.  . fludrocortisone (FLORINEF) 0.1 MG tablet Take 1 tablet (0.1 mg total) by mouth daily.  . Fluocinolone Acetonide Body 0.01 % OIL APPLY 1 ML ON THE SKIN TWICE A DAY  . FLUoxetine (PROZAC) 20 MG capsule TAKE 1 CAPSULE DAILY. TAKE WITH 40MG  TO EQUAL 60MG     ONCE DAILY  . FLUoxetine (PROZAC) 40 MG capsule TAKE 1 CAPSULE EVERY       MORNING  . furosemide (LASIX) 20 MG tablet TAKE 1 TABLET BY MOUTH EVERY DAY  . hydrOXYzine (ATARAX/VISTARIL) 25 MG tablet TAKE 1 TABLET BY MOUTH AT BEDTIME MAY MAKE DROWSY  . levothyroxine (SYNTHROID, LEVOTHROID) 25 MCG tablet TAKE 1 TABLET DAILY BEFORE BREAKFAST  . midodrine (PROAMATINE) 5 MG tablet TAKE 1 TABLET (5 MG TOTAL) BY MOUTH 2 (TWO) TIMES DAILY WITH BREAKFAST AND LUNCH. (Patient taking differently: Take 5 mg by mouth 2 (two) times daily. )  . mirtazapine (REMERON) 15 MG tablet Take 1 tablet (15 mg total) by mouth at bedtime.  [name] NITROSTAT 0.4 MG SL tablet PLACE 1 TABLET (0.4 MG TOTAL) UNDER THE TONGUE EVERY 5 (FIVE) MINUTES AS NEEDED FOR CHEST PAIN. (Patient taking differently: Place 0.4 mg under the tongue every 5 (five) minutes as needed for chest pain. )  . Teriparatide, Recombinant, (FORTEO) 600 MCG/2.4ML SOLN Inject 20 mcg daily into the skin.   [name] vitamin B-12 (CYANOCOBALAMIN) 1000 MCG tablet Take 1,000 mcg by mouth daily.      Allergies:   Codeine and Tape   Social History   Socioeconomic History  . Marital status: Married    Spouse name: Not on file  .  Number of children: Not on file  . Years of education: Not on file  . Highest education level: Not on file  Occupational History  . Occupation: retired Designer, jewellery  . Financial resource strain: Not on file  . Food insecurity:    Worry: Not on file    Inability: Not on file  . Transportation needs:    Medical: Not on file    Non-medical: Not on file  Tobacco Use  . Smoking status: Never Smoker  . Smokeless tobacco: Never Used  Substance and Sexual Activity  . Alcohol use: Yes  Alcohol/week: 1.0 standard drinks    Types: 1 Cans of beer per week    Comment: Occasional beer  . Drug use: No  . Sexual activity: Yes    Partners: Female    Birth control/protection: None  Lifestyle  . Physical activity:    Days per week: Not on file    Minutes per session: Not on file  . Stress: Not on file  Relationships  . Social connections:    Talks on phone: Not on file    Gets together: Not on file    Attends religious service: Not on file    Active member of club or organization: Not on file    Attends meetings of clubs or organizations: Not on file    Relationship status: Not on file  Other Topics Concern  . Not on file  Social History Narrative   Regular exercise--yes--jog 5 miles 6 days a week     Family History: The patient's family history includes Cancer in his father; Coronary artery disease in his mother; Diabetes in his brother; Heart attack in his mother; Heart disease in his mother; Hypertension in his mother; Lung cancer in his father.  ROS:   Please see the history of present illness.    ROS  All other systems reviewed and negative.   EKGs/Labs/Other Studies Reviewed:    The following studies were reviewed today: OR note for Inspire device implantation  EKG:  EKG is not ordered today  Recent Labs: 12/01/2017: Hemoglobin 13.8; Platelets 185 02/13/2018: ALT 16; BUN 13; Creatinine, Ser 1.46; Potassium 5.0; Sodium 140; TSH 5.08   Recent Lipid Panel     Component Value Date/Time   CHOL 174 02/13/2018 1412   CHOL 145 07/19/2016 0853   TRIG 55.0 02/13/2018 1412   HDL 70.70 02/13/2018 1412   HDL 70 07/19/2016 0853   CHOLHDL 2 02/13/2018 1412   VLDL 11.0 02/13/2018 1412   LDLCALC 92 02/13/2018 1412   LDLCALC 63 07/19/2016 0853   LDLDIRECT 57 06/22/2009 1228    Physical Exam:    VS:  BP 130/70   Pulse 77   Ht 5' 9.25" (1.759 m)   Wt 172 lb 12.8 oz (78.4 kg)   SpO2 98%   BMI 25.33 kg/m     Wt Readings from Last 3 Encounters:  03/06/18 172 lb 12.8 oz (78.4 kg)  02/22/18 171 lb 4 oz (77.7 kg)  02/13/18 166 lb 4 oz (75.4 kg)     GEN:  Well nourished, well developed in no acute distress HEENT: Normal NECK: No JVD; No carotid bruits LYMPHATICS: No lymphadenopathy CARDIAC: RRR, no murmurs, rubs, gallops RESPIRATORY:  Clear to auscultation without rales, wheezing or rhonchi  ABDOMEN: Soft, non-tender, non-distended MUSCULOSKELETAL:  No edema; No deformity  SKIN: Warm and dry NEUROLOGIC:  Alert and oriented x 3 PSYCHIATRIC:  Normal affect   ASSESSMENT:    1. OSA (obstructive sleep apnea)   2. Coronary artery disease involving native coronary artery of native heart without angina pectoris   3. Orthostatic hypotension   4. Pure hypercholesterolemia    PLAN:    In order of problems listed above:  1.  OSA - he was intolerant to CPAP therapy and underwent evaluation by ENT and had the inspire device implanted in August of this year.  He is doing well and says that he is sleeping well without any daytime sleepiness.  He feels rested in the morning.  He is not had any snoring.  He is able  to adjust the device as he needs.  He is scheduled for a sleep study in the sleep lab in the next few weeks for final adjustments.  2. ASCAD -  s/p remote PCI of the LAD and RCA.  He denies any anginal symptoms.  He will continue on aspirin 81 mg daily, Plavix 75 mg daily and high-dose statin.  3.  Orthostatic hypotension -stable  4.   Hyperlipidemia - LDL goal is less than 70.  His LDL was 92 on 02/13/2018 and triglycerides 55.  ALT was normal at 16.  His Lipitor was recently increased to 80 mg daily based on the labs and he has repeat lipids in a few weeks.  Medication Adjustments/Labs and Tests Ordered: Current medicines are reviewed at length with the patient today.  Concerns regarding medicines are outlined above.  No orders of the defined types were placed in this encounter.  No orders of the defined types were placed in this encounter.   Signed, Fransico Him, MD  03/06/2018 9:43 AM    [place]

## 2018-03-06 NOTE — Telephone Encounter (Signed)
Spoke with Marti Sleigh he states pt does not have [employer] as their insurance, pt has medicare.

## 2018-03-06 NOTE — Patient Instructions (Signed)
Medication Instructions:  Your physician recommends that you continue on your current medications as directed. Please refer to the Current Medication list given to you today.  If you need a refill on your cardiac medications before your next appointment, please call your pharmacy.   Lab work:  If you have labs (blood work) drawn today and your tests are completely normal, you will receive your results only by: . MyChart Message (if you have MyChart) OR . A paper copy in the mail If you have any lab test that is abnormal or we need to change your treatment, we will call you to review the results.  Follow-Up: At [facility], you and your health needs are our priority.  As part of our continuing mission to provide you with exceptional heart care, we have created designated Provider Care Teams.  These Care Teams include your primary Cardiologist (physician) and Advanced Practice Providers (APPs -  Physician Assistants and Nurse Practitioners) who all work together to provide you with the care you need, when you need it. You will need a follow up appointment in 1 years.  Please call our office 2 months in advance to schedule this appointment.  You may see Traci Turner, MD or one of the following Advanced Practice Providers on your designated Care Team:   Brittainy Simmons, PA-C Dayna Dunn, PA-C . Michele Lenze, PA-C    

## 2018-03-06 NOTE — Progress Notes (Signed)
  Numeric Pain Rating Scale and Functional Assessment Average Pain 8 Pain Right Now 0 My pain is intermittent, burning, dull and aching Pain is worse with: walking and standing Pain improves with: rest   In the last MONTH (on 0-10 scale) has pain interfered with the following?  1. General activity like being  able to carry out your everyday physical activities such as walking, climbing stairs, carrying groceries, or moving a chair?  Rating(10)  2. Relation with others like being able to carry out your usual social activities and roles such as  activities at home, at work and in your community. Rating(10)  3. Enjoyment of life such that you have  been bothered by emotional problems such as feeling anxious, depressed or irritable?  Rating(9)

## 2018-03-14 ENCOUNTER — Other Ambulatory Visit: Payer: Self-pay | Admitting: Cardiology

## 2018-03-14 ENCOUNTER — Other Ambulatory Visit: Payer: Self-pay | Admitting: *Deleted

## 2018-03-14 ENCOUNTER — Other Ambulatory Visit: Payer: Self-pay | Admitting: Family Medicine

## 2018-03-14 MED ORDER — LEVOTHYROXINE SODIUM 25 MCG PO TABS: 25.0000 ug | ORAL_TABLET | Freq: Every day | ORAL | 3 refills | Status: DC

## 2018-03-14 MED ORDER — MIDODRINE HCL 5 MG PO TABS: ORAL_TABLET | ORAL | 3 refills | Status: DC

## 2018-03-14 NOTE — Telephone Encounter (Signed)
Last office visit 02/22/2018 for thumb pain.  Last refilled 02/09/2018 for #30 with no refills.    Next appt: 05/24/2018 for labs.  Ok to refill?

## 2018-03-16 NOTE — Assessment & Plan Note (Signed)
Initially thought to be MSK strain... Pt high risk for fracture. Eval with X-ray as possible compression fracture.

## 2018-03-20 ENCOUNTER — Ambulatory Visit (INDEPENDENT_AMBULATORY_CARE_PROVIDER_SITE_OTHER): Payer: Medicare Other | Admitting: Physical Medicine and Rehabilitation

## 2018-03-20 ENCOUNTER — Ambulatory Visit (INDEPENDENT_AMBULATORY_CARE_PROVIDER_SITE_OTHER): Payer: Self-pay

## 2018-03-20 VITALS — BP 129/82 | HR 74 | Temp 98.5°F

## 2018-03-20 DIAGNOSIS — M47814 Spondylosis without myelopathy or radiculopathy, thoracic region: Secondary | ICD-10-CM

## 2018-03-20 DIAGNOSIS — S22080D Wedge compression fracture of T11-T12 vertebra, subsequent encounter for fracture with routine healing: Secondary | ICD-10-CM | POA: Diagnosis not present

## 2018-03-20 DIAGNOSIS — M546 Pain in thoracic spine: Secondary | ICD-10-CM | POA: Diagnosis not present

## 2018-03-20 DIAGNOSIS — G8929 Other chronic pain: Secondary | ICD-10-CM | POA: Diagnosis not present

## 2018-03-20 MED ORDER — BUPIVACAINE HCL 0.5 % IJ SOLN
3.0000 mL | Freq: Once | INTRAMUSCULAR | Status: AC
  Administered 2018-03-20: 3 mL

## 2018-03-20 MED ORDER — BETAMETHASONE SOD PHOS & ACET 6 (3-3) MG/ML IJ SUSP
12.0000 mg | Freq: Once | INTRAMUSCULAR | Status: AC
  Administered 2018-03-20: 12 mg

## 2018-03-20 NOTE — Patient Instructions (Signed)

## 2018-03-20 NOTE — Progress Notes (Signed)
 .  Numeric Pain Rating Scale and Functional Assessment Average Pain 8   In the last MONTH (on 0-10 scale) has pain interfered with the following?  1. General activity like being  able to carry out your everyday physical activities such as walking, climbing stairs, carrying groceries, or moving a chair?  Rating(6)   +Driver, +BT(plavix, ok for inj), -Dye Allergies.  

## 2018-03-28 ENCOUNTER — Other Ambulatory Visit: Payer: Self-pay | Admitting: Family Medicine

## 2018-03-28 NOTE — Telephone Encounter (Signed)
Last office visit 02/22/2018 for CPE.  Last refilled Flexeril-02/26/2018 for #30 with no refills. Diclofenac 03/14/2018 for #30 with no refills.  Next Appt: 05/24/2018 for lab only to recheck cholesterol.

## 2018-03-29 ENCOUNTER — Telehealth: Payer: Self-pay | Admitting: Family Medicine

## 2018-03-29 ENCOUNTER — Ambulatory Visit: Payer: Self-pay

## 2018-03-29 NOTE — Telephone Encounter (Signed)
I spoke with Leafy Ro RN and she said appt was changed to 41' with Glenda Chroman FNP at same time on 03/30/18 at 3 PM. FYI to Glenda Chroman FNP.

## 2018-03-29 NOTE — Telephone Encounter (Signed)
Pt. Reports in the past 2 weeks he has had 2 episodes of palpitations, dizziness and sweating. Last about 10 minutes and then goes away. No chest pain. States he had stent placement "about 10 years ago." No symptoms at present time. Pulse right now is 83. Instructed pt. To let his cardiologist know as well. Request an afternoon appointment. Made for tomorrow. No availability with Dr. Diona Browner. Instructed if symptoms return or he develops shortness of breath or chest pain, to go to ED. Verbalizes understanding.  Reason for Disposition . History of heart disease  (i.e., heart attack, bypass surgery, angina, angioplasty, CHF) (Exception: brief heart beat symptoms that went away and now feels well)  Answer Assessment - Initial Assessment Questions 1. DESCRIPTION: "Please describe your heart rate or heart beat that you are having" (e.g., fast/slow, regular/irregular, skipped or extra beats, "palpitations")     Palpitations 2. ONSET: "When did it start?" (Minutes, hours or days)      1 week ago 3. DURATION: "How long does it last" (e.g., seconds, minutes, hours)     Lasts 10 minutes 4. PATTERN "Does it come and go, or has it been constant since it started?"  "Does it get worse with exertion?"   "Are you feeling it now?"     Comes and goes 5. TAP: "Using your hand, can you tap out what you are feeling on a chair or table in front of you, so that I can hear?" (Note: not all patients can do this)       No 6. HEART RATE: "Can you tell me your heart rate?" "How many beats in 15 seconds?"  (Note: not all patients can do this)       Pulse now is 83 7. RECURRENT SYMPTOM: "Have you ever had this before?" If so, ask: "When was the last time?" and "What happened that time?"      No 8. CAUSE: "What do you think is causing the palpitations?"     Unsure 9. CARDIAC HISTORY: "Do you have any history of heart disease?" (e.g., heart attack, angina, bypass surgery, angioplasty, arrhythmia)      3 stents in 2010 10.  OTHER SYMPTOMS: "Do you have any other symptoms?" (e.g., dizziness, chest pain, sweating, difficulty breathing)       Dizzy and sweaty 11. PREGNANCY: "Is there any chance you are pregnant?" "When was your last menstrual period?"       n/a  Protocols used: HEART RATE AND HEARTBEAT QUESTIONS-A-AH

## 2018-03-29 NOTE — Telephone Encounter (Signed)
Left message asking pt to call office Please let pt know his appointment tomorrow 04/30/17 is with Tor Netters not Webb Silversmith.  This was changed to allow more time

## 2018-03-30 ENCOUNTER — Ambulatory Visit: Payer: Medicare Other | Admitting: Internal Medicine

## 2018-03-30 ENCOUNTER — Encounter: Payer: Self-pay | Admitting: Family Medicine

## 2018-03-30 ENCOUNTER — Ambulatory Visit (INDEPENDENT_AMBULATORY_CARE_PROVIDER_SITE_OTHER): Payer: Medicare Other | Admitting: Family Medicine

## 2018-03-30 VITALS — BP 114/60 | HR 78 | Temp 97.6°F | Ht 69.25 in | Wt 171.0 lb

## 2018-03-30 DIAGNOSIS — R42 Dizziness and giddiness: Secondary | ICD-10-CM

## 2018-03-30 DIAGNOSIS — I2510 Atherosclerotic heart disease of native coronary artery without angina pectoris: Secondary | ICD-10-CM | POA: Diagnosis not present

## 2018-03-30 DIAGNOSIS — R002 Palpitations: Secondary | ICD-10-CM | POA: Diagnosis not present

## 2018-03-30 MED ORDER — NITROGLYCERIN 0.4 MG SL SUBL: 0.4000 mg | SUBLINGUAL_TABLET | SUBLINGUAL | 1 refills | Status: DC | PRN

## 2018-03-30 NOTE — Patient Instructions (Addendum)
Good to see you today  I have sent in a refill of your nitroglycerin, please use if you have any additional episodes  Please follow up with cardiologist if you have any additional episodes

## 2018-03-30 NOTE — Progress Notes (Signed)
Subjective:    Patient ID: Shawn Lam, male    DOB: April 20, 1950, 67 y.o.   MRN: [id number]  HPI This is a 67 yo male, accompanied by his wife, who presents today with palpitations x 3 episodes over last 1-3 weeks. Episodes last about 10 minutes. Starts with light headedness and he feels his heart race and breaks out in a sweat. Afterwards, he feels very tired and has some excess abdominal gas. No relationship to eating although he had not eaten for several hours with last episode. Feels stressed out all the time. This is not unusual for him. Last episode was yesterday and the other two were over last week or two.. Can tell it is coming on. Feels syncopal. Has not lost consciousness. Episodes resolve with rest. He has nitroglycerin but has not taken. Does not carry with him all the time. Thinks his bottle is expired.  Has mentioned to cardiologist at last visit. She told him to let her know if it occurred again. Wife wonders if it might be stress/anxiety attack. Per patient's wife, he always sweats at night (for many years).   Has been working part time at [employer]. Has noticed some pain and swelling of feet.   Had labs 02/13/18- TSH 5.08, CMP with normal electrolytes, mildly elevated glucose, slightly decreased GFR, normal LFTs.   Past Medical History:  Diagnosis Date  . Anemia   . Anxiety   . Asymptomatic LV dysfunction    50-55% by echo 06/2016  . Berger's disease   . CAD (coronary artery disease) 06/2005   PCI of LAD and RCA  . Depression   . Dilated aortic root (HCC)    4cm at sinus of Valsalva  . Hematuria   . Hyperlipidemia   . Hypothyroidism   . Insomnia   . Left ureteral stone   . Orthostatic hypotension    negative tilt table  . OSA (obstructive sleep apnea)    moderate with AHI 17/hr, uses CPAP nightly  . PVC's (premature ventricular contractions) 07/07/2016  . Renal disorder    PT IS SEEING DR. Lorrene Reid- NEPHROLOGIST  . Rosacea   . Urticaria    Past  Surgical History:  Procedure Laterality Date  . CARDIAC CATHETERIZATION  09/06/2013   [facility]  . CARDIAC CATHETERIZATION    . CORONARY ANGIOPLASTY  2004   STENT PLACEMENT  . CYSTOSCOPY WITH RETROGRADE PYELOGRAM, URETEROSCOPY AND STENT PLACEMENT Left 03/19/2014   Procedure: CYSTOSCOPY WITH RETROGRADE PYELOGRAM, URETEROSCOPY AND STENT PLACEMENT;  Surgeon: Junious Dresser, MD;  Location: [facility];  Service: Urology;  Laterality: Left;  . CYSTOSCOPY WITH RETROGRADE PYELOGRAM, URETEROSCOPY AND STENT PLACEMENT Bilateral 05/20/2015   Procedure:  CYSTOSCOPY WITH BILATERAL RETROGRADE PYELOGRAM,RIGHT  DIAGNOSTIC URETEROSCOPY ,LEFT URETEROSCOPY WITH HOLMIUM LASER  AND BILATERAL  STENT PLACEMENT ;  Surgeon: Alexis Frock, MD;  Location: [facility];  Service: Urology;  Laterality: Bilateral;  . DRUG INDUCED ENDOSCOPY N/A 10/20/2017   Procedure: DRUG INDUCED SLEEP ENDOSCOPY;  Surgeon: Melida Quitter, MD;  Location: [facility];  Service: ENT;  Laterality: N/A;  . EP IMPLANTABLE DEVICE N/A 05/13/2016   Procedure: Loop Recorder Insertion;  Surgeon: Deboraha Sprang, MD;  Location: [place];  Service: Cardiovascular;  Laterality: N/A;  . HOLMIUM LASER APPLICATION Bilateral 05/18/3783   Procedure: HOLMIUM LASER APPLICATION;  Surgeon: Alexis Frock, MD;  [facility];  Service: Urology;  Laterality: Bilateral;  . IMPLIMENTATION OF A HYPOGLOSSAL NERVE STIMULATOR  12/08/2017  [facility]   . LEFT HEART CATHETERIZATION WITH CORONARY ANGIOGRAM N/A 09/06/2013   Procedure: LEFT HEART CATHETERIZATION WITH CORONARY ANGIOGRAM;  Surgeon: Sinclair Grooms, MD;  Location[facility];  Service: Cardiovascular;  Laterality: N/A;  . LEFT KNEE CAP  SURGERY ABOUT 40 YRS AGO  ? 1970    . STONE EXTRACTION WITH BASKET Left 03/19/2014   Procedure: STONE EXTRACTION WITH BASKET;  Surgeon: Junious Dresser, MD;  [facility];  Service: Urology;  Laterality: Left;   Family History  Problem Relation Age of Onset  .  Coronary artery disease Mother   . Heart attack Mother   . Heart disease Mother   . Hypertension Mother   . Cancer Father        lung and colon  . Lung cancer Father        smoker  . Diabetes Brother    Social History   Tobacco Use  . Smoking status: Never Smoker  . Smokeless tobacco: Never Used  Substance Use Topics  . Alcohol use: Yes    Alcohol/week: 1.0 standard drinks    Types: 1 Cans of beer per week    Comment: Occasional beer  . Drug use: No      Review of Systems Per HPI    Objective:   Physical Exam Physical Exam  Constitutional: Oriented to person, place, and time. He appears well-developed and well-nourished.  HENT:  Head: Normocephalic and atraumatic.  Eyes: Conjunctivae are normal.  Neck: Normal range of motion. Neck supple.  Cardiovascular: Normal rate, regular rhythm and normal heart sounds.   Pulmonary/Chest: Effort normal and breath sounds normal.  Musculoskeletal: Trace pretibial edema..  Neurological: Alert and oriented to person, place, and time.  Skin: Skin is warm and dry.  Psychiatric: Normal mood and affect. Behavior is normal. Judgment and thought content normal.  Vitals reviewed.    BP 114/60 (BP Location: Right Arm, Patient Position: Sitting, Cuff Size: Normal)   Pulse 78   Temp 97.6 F (36.4 C) (Oral)   Ht 5' 9.25" (1.759 m)   Wt 171 lb (77.6 kg)   SpO2 98%   BMI 25.07 kg/m  Wt Readings from Last 3 Encounters:  03/30/18 171 lb (77.6 kg)  03/06/18 172 lb 12.8 oz (78.4 kg)  02/22/18 171 lb 4 oz (77.7 kg)    EKG- normal sinus rhythm, rate 73, no change from prior tracing    Assessment & Plan:  1. Palpitations - unsure about these episodes, have discussed possible causes with patient and his wife.  Have encouraged him to drink adequate fluids, keep a log of episodes, notify cardiology if he has any more episodes -Refill of sublingual nitroglycerin sent to his pharmacy and instructed him on use and potential side effects,  encouraged him to use if he has another episode to see if it helps - EKG 12-Lead  2. Intermittent lightheadedness -See #1   Clarene Reamer, FNP-BC  [facility]  03/30/2018 4:01 PM

## 2018-03-30 NOTE — Telephone Encounter (Signed)
Noted  

## 2018-04-05 ENCOUNTER — Ambulatory Visit (INDEPENDENT_AMBULATORY_CARE_PROVIDER_SITE_OTHER): Payer: Medicare Other

## 2018-04-05 DIAGNOSIS — R002 Palpitations: Secondary | ICD-10-CM

## 2018-04-05 LAB — CUP PACEART REMOTE DEVICE CHECK
Date Time Interrogation Session: 20191225111027
Implantable Pulse Generator Implant Date: 20180202

## 2018-04-05 NOTE — Progress Notes (Signed)
[facility] Summary Report / Loop Recorder 

## 2018-04-09 ENCOUNTER — Other Ambulatory Visit: Payer: Self-pay | Admitting: Family Medicine

## 2018-04-09 NOTE — Telephone Encounter (Signed)
Last office visit 03/30/2018 with Glenda Chroman for palpitations.  Last refilled 03/28/2018 for #30 with no refills.  Next Appt: 02/26/2019 for CPE.

## 2018-04-10 ENCOUNTER — Other Ambulatory Visit: Payer: Self-pay | Admitting: Physician Assistant

## 2018-04-16 ENCOUNTER — Encounter (INDEPENDENT_AMBULATORY_CARE_PROVIDER_SITE_OTHER): Payer: Self-pay | Admitting: Physical Medicine and Rehabilitation

## 2018-04-16 DIAGNOSIS — I2510 Atherosclerotic heart disease of native coronary artery without angina pectoris: Secondary | ICD-10-CM | POA: Diagnosis not present

## 2018-04-16 DIAGNOSIS — M546 Pain in thoracic spine: Secondary | ICD-10-CM | POA: Diagnosis not present

## 2018-04-16 DIAGNOSIS — G8929 Other chronic pain: Secondary | ICD-10-CM | POA: Diagnosis not present

## 2018-04-16 DIAGNOSIS — S32000D Wedge compression fracture of unspecified lumbar vertebra, subsequent encounter for fracture with routine healing: Secondary | ICD-10-CM | POA: Diagnosis not present

## 2018-04-16 DIAGNOSIS — M7918 Myalgia, other site: Secondary | ICD-10-CM | POA: Diagnosis not present

## 2018-04-16 MED ORDER — TRIAMCINOLONE ACETONIDE 40 MG/ML IJ SUSP
20.0000 mg | INTRAMUSCULAR | Status: AC | PRN
  Administered 2018-04-16: 20 mg via INTRAMUSCULAR

## 2018-04-16 MED ORDER — LIDOCAINE HCL 1 % IJ SOLN
3.0000 mL | INTRAMUSCULAR | Status: AC | PRN
  Administered 2018-04-16: 3 mL

## 2018-04-16 NOTE — Procedures (Signed)
Thoracic Diagnostic Facet Joint Nerve Block with Fluoroscopic Guidance  Patient: Shawn Lam      Date of Birth: 07-04-1950 MRN: [id number] PCP: Jinny Sanders, MD      Visit Date: 03/20/2018   Universal Protocol:    Date/Time: 01/06/206:25 AM  Consent Given By: the patient  Position: PRONE   Additional Comments: Vital signs were monitored before and after the procedure. Patient was prepped and draped in the usual sterile fashion. The correct patient, procedure, and site was verified.   Injection Procedure Details:  Procedure Site One Meds Administered:  Meds ordered this encounter  Medications  . bupivacaine (MARCAINE) 0.5 % (with pres) injection 3 mL  . betamethasone acetate-betamethasone sodium phosphate (CELESTONE) injection 12 mg     Laterality: Bilateral  Location/Site:  T7-8 T8-9  Needle size: 22 G  Needle type: spinal needle   Findings:    -Comments: Excellent flow contrast over the pedicle shadows without intravascular flow.  Procedure Details: The fluoroscope beam is vertically oriented and tilted cranially and caudally to square off the vertebral body endplates to achieve a true AP midline view.  The skin over the target area of the pedicle shadow ipsilateral to the desired medial branch nerve was locally anesthetized with a 1 ml volume of 1% Lidocaine without Epinephrine.  A 22 gauge spinal needle was inserted and advanced in a trajectory view down to the target.   After contact with periosteum and negative aspirate for blood and CSF, correct placement without intravascular or epidural spread was confirmed by injecting 0.5 ml. of Omnipaque-240.  A spot radiograph was obtained of this image.  Next, a 0.5 ml. volume of the injectate described above was injected. The needle was then redirected to the other facet joint nerves mentioned above if needed.  Prior to the procedure, the patient was given a Pain Diary which was completed for baseline measurements.   After the procedure, the patient rated their pain every 30 minutes and will continue rating at this frequency for a total of 5 hours.  The patient has been asked to complete the Diary and return to [place] by mail, fax or hand delivered as soon as possible.   Additional Comments:  The patient tolerated the procedure well Dressing: Band-Aid    Post-procedure details: Patient was observed during the procedure. Post-procedure instructions were reviewed. Follow-Up Instructions: Given to the patient Patient left the clinic in stable condition.

## 2018-04-16 NOTE — Progress Notes (Signed)
Shawn Lam - 68 y.o. male MRN [id number]  Date of birth: 1950/12/14  Office Visit Note: Visit Date: 03/06/2018 PCP: Jinny Sanders, MD Referred by: Jinny Sanders, MD  Subjective: Chief Complaint  Patient presents with  . Middle Back - Pain   HPI: Shawn Lam is a 68 y.o. male who comes in today Evaluation management consultation for chronic worsening thoracic and low back pain with history of multiple compression fracture status post T12 kyphoplasty and likely new small T11 endplate compression fracture on most recent thoracic x-ray.  He comes in today at the request of Dr. [name] his primary care physician.  Patient has had excellent work-up of his bone density with really more findings of osteopenia but continued history of multiple compression fractures.  He has had good work-up of other reasons for having compression fractures including monoclonal gammopathy's, parathyroid hormone etc.  Laboratory studies have really not been revealing.  He has been followed extensively in [facility] by Dr.Thomas A. Dimmig, MD who is a Licensed conveyancer with [facility].  He is tried treatment for bone density issues with Forteo and did complete about a year of Forteo but could not afford this.  He is currently on alendronate.  He uses mild amount of hydrocodone.  Unable to really take anti-inflammatories with history of coronary artery disease and on clopidogrel.  Case also complicated by generalized anxiety disorder and some depression.  He reports that he was happy with the physician he was seeing in [place] but his primary care physician thought it might be wise to see someone closer to [place].  Patient is retired and works part-time.  He reports the pain is worse with standing and walking a lot lying down tends to help relieve the pain.  He does not really report any leg pain or paresthesias or numbness or tingling or focal weakness.  Pain is really in the mid back region  seemingly a little higher than the T11 area.  This has been ongoing for several months without specific injury or fall.  Did have MRI this past year of the thoracic and lumbar spine which is reviewed below.  Has not had MRI since new finding of small compression fracture at T11 on x-ray imaging.  Review of Systems  Constitutional: Negative for chills, fever, malaise/fatigue and weight loss.  HENT: Negative for hearing loss and sinus pain.   Eyes: Negative for blurred vision, double vision and photophobia.  Respiratory: Negative for cough and shortness of breath.   Cardiovascular: Negative for chest pain, palpitations and leg swelling.  Gastrointestinal: Negative for abdominal pain, nausea and vomiting.  Genitourinary: Negative for flank pain.  Musculoskeletal: Positive for back pain. Negative for myalgias.  Skin: Negative for itching and rash.  Neurological: Negative for tremors, focal weakness and weakness.  Endo/Heme/Allergies: Negative.   Psychiatric/Behavioral: Negative for depression.  All other systems reviewed and are negative.  Otherwise per HPI.  Assessment & Plan: Visit Diagnoses:  1. Chronic midline thoracic back pain   2. Chronic bilateral low back pain without sciatica   3. Compression fracture of T11 vertebra, initial encounter ([place])   4. Thoracic compression fracture, with routine healing, subsequent encounter   5. Lumbar compression fracture, with routine healing, subsequent encounter   6. Myofascial pain syndrome     Plan: Findings:  Complicated history of multiple compression fractures thoracic and lumbar spine status post T12 kyphoplasty by Dr. Marcello Moores Dimmig.  Has really had extensive work-up on the source  of compression fractures without any really good acknowledgment to mom on why they are occurring.  Bone density test has not been horrible.  Patient currently on alendronate.  Has had 1 year Forteo.  Compression fracture at T11 is very small but acute.  Pain on exam  is in the general area but not specifically over T11.  He has pain with palpation and taut bands bilaterally with clear myofascial pain syndrome as well.  He has not had any dry needling although he has had physical therapy on numerous occasions.  He has had bracing in the past.  He has no red flag complaints of claudication or numbness or tingling or weakness.  I do think his pain is a mixture of pain from compression fractures and biomechanical changes from that.  New compression fracture is likely adjacent level issue at T12 kyphoplasty causing some mechanical changes.  He also has some myofascial pain history of generalized anxiety.  We will try focal trigger point injection today and if it is very beneficial would consider having him see a different physical therapist for dry needling.  Talked extensively about weightbearing exercises and staying active.  He will continue current medications.  I am also going to schedule him for facet joint block bilaterally around the area of the new compression fracture.  Sometimes mechanically we get good relief with facet joint block and could look at radiofrequency ablation and we did talk about this.  I do not feel the need to repeat MRI at this point given the full work-up he has had in the past and this is a very small compression fracture and clearly is new since the last MRI.  He will continue to see Dr. Diona Browner for management of the osteoporosis and compression fracture from a medical standpoint.  Just as a thought as well could end up switching his antidepressant to something like duloxetine for more of a pain management approach.    Meds & Orders: No orders of the defined types were placed in this encounter.   Orders Placed This Encounter  Procedures  . Trigger Point Inj    Follow-up: No follow-ups on file.   Procedures: Trigger Point Inj Date/Time: 04/16/2018 6:16 AM Performed by: Magnus Sinning, MD Authorized by: Magnus Sinning, MD   Consent  Given by:  Patient Site marked: the procedure site was marked   Timeout: prior to procedure the correct patient, procedure, and site was verified   Indications:  Pain Total # of Trigger Points:  3 or more Location: back   Location comment:  Thoracic paraspinal Needle Size:  25 G Approach:  Dorsal and lateral Medications #1:  3 mL lidocaine 1 %; 20 mg triamcinolone acetonide 40 MG/ML Patient tolerance:  Patient tolerated the procedure well with no immediate complications Comments: Trigger points and taut bands were palpated in the paraspinal musculature and multifidus as well as latissimus dorsi.  Needling technique was utilized.    No notes on file   Clinical History: THORACIC SPINE - 3 VIEWS  COMPARISON:  MRI thoracic spine dated April 17, 2017.  FINDINGS: Twelve rib-bearing thoracic vertebral bodies. Questionable new irregularity of the T11 superior endplate. Chronic T7, T8, and T9 compression fractures without further height loss. Unchanged moderate to severe T12 compression fracture status post kyphoplasty. Alignment is normal. Mild thoracic spondylosis is unchanged.  IMPRESSION: 1. Questionable new mild irregularity of the T11 superior endplate without significant height loss, potentially reflecting new compression fracture. Given tenderness in this area, consider MRI for  definitive evaluation. 2. Chronic T7 through T9 compression fractures without further height loss. Unchanged moderate to severe T12 compression fracture status post kyphoplasty.   Electronically Signed   By: Titus Dubin M.D.   On: 02/09/2018 11:36  MRI THORACIC AND LUMBAR SPINE WITHOUT CONTRAST  FINDINGS: MRI THORACIC SPINE FINDINGS  Limited cervical spine imaging: Negative.  Alignment: Mild straightening of thoracic kyphosis since 2015. No spondylolisthesis.  Vertebrae: Chronic T8 superior endplate compression, unchanged since the 2015 chest CT.  Previous T12 compression  fracture with evidence of augmentation since May 2018.  T7 vertebral compression with superior and inferior endplate deformity. Up to 50% central loss of vertebral body height with patchy central vertebral marrow edema. The T7 pedicles and posterior elements appear remain intact. No retropulsion.  T9 vertebral body compression with superior endplate deformity and 31% loss of vertebral body height. Confluent T9 vertebral body marrow edema. No retropulsion. The T9 pedicles and posterior elements appear to remain intact.  Other thoracic levels appear intact and underlying thoracic bone marrow signal is within normal limits; only mildly heterogeneous on T1 weighted imaging.  Disc levels:  At the T12 level there is mild to moderate retropulsion of the posterosuperior T12 endplate resulting in mild spinal stenosis (series 8, image 36), but no spinal cord mass effect.  MRI LUMBAR SPINE FINDINGS  Vertebrae: Interval augmentation of the T12 compression fracture.  New mild superior endplate deformities of both L1 and L2, but only minimal to mild associated focal endplate marrow edema (series 12, image 10 and series 13, image 10). Minimal retropulsion, and the pedicles and posterior elements of both levels appear to remain intact.   Regression of bilateral sacral ala marrow edema since 2018 compatible with healing sacral insufficiency type fractures. Mild residual (series 13, image 17 on the left and image 1 on the right). Intact visible SI joints.  Conus medullaris and cauda equina: Conus extends to the L1 level. Conus and cauda equina appear normal.  Paraspinal and other soft tissues: Stable and negative visualized abdominal viscera. Negative visualized posterior paraspinal soft tissues.  Disc levels:  Stable mild to moderate L3-L4 and L4-L5 disc degeneration with disc bulging. No lumbar spinal stenosis.  IMPRESSION: MR THORACIC SPINE IMPRESSION  1. Acute to  subacute compression fractures of both the T7 and T9 vertebral bodies. 40-50% loss of height with no retropulsion or complicating features. 2. Augmented T12 compression fracture. Mild to moderate retropulsion at that level results in mild T12 level spinal stenosis, but no spinal cord mass effect or signal abnormality. 3. Chronic mild T8 compression fracture is stable since 2015.  MR LUMBAR SPINE IMPRESSION  1. Late subacute to early chronic appearing mild compression fractures of both the L1 and L2 superior endplates, new since 09/02/2016. No significant retropulsion or complicating features. 2. Healing and nearly resolved bilateral sacral insufficiency fractures. 3. The L3 through L5 levels remain intact.   Electronically Signed By: Genevie Ann M.D. On: 04/17/2017 18:36   He reports that he has never smoked. He has never used smokeless tobacco. No results for input(s): HGBA1C, LABURIC in the last 8760 hours.  Objective:  VS:  HT:    WT:   BMI:     BP:130/87  HR:87bpm  TEMP: ( )  RESP:  Physical Exam  Ortho Exam Imaging: No results found.  Past Medical/Family/Surgical/Social History: Medications & Allergies reviewed per EMR, new medications updated. Patient Active Problem List   Diagnosis Date Noted  . Chronic pain of right thumb 02/22/2018  .  Acute bilateral thoracic back pain 01/23/2018  . Osteoporosis 02/10/2017  . Melena 02/10/2017  . Hypothyroidism 11/02/2016  . Thoracic compression fracture ([place]) 09/06/2016  . PVC's (premature ventricular contractions) 07/07/2016  . Hypoalbuminemia 06/28/2016  . IgA nephropathy 06/07/2016  . Palpitation 03/15/2016  . Mild depression ([place]) 11/20/2015  . OSA (obstructive sleep apnea) 02/05/2014  . Orthostatic hypotension 02/07/2013  . Macrocytic anemia 01/31/2013  . Scotoma involving central area 01/29/2013  . Optic neuropathy 01/28/2013  . HEMATURIA UNSPECIFIED 01/21/2010  . Hyperlipidemia 07/05/2006  . Generalized  anxiety disorder 07/05/2006  . CAD (coronary artery disease) 07/05/2006  . GERD 07/05/2006   Past Medical History:  Diagnosis Date  . Anemia   . Anxiety   . Asymptomatic LV dysfunction    50-55% by echo 06/2016  . Berger's disease   . CAD (coronary artery disease) 06/2005   PCI of LAD and RCA  . Depression   . Dilated aortic root (HCC)    4cm at sinus of Valsalva  . Hematuria   . Hyperlipidemia   . Hypothyroidism   . Insomnia   . Left ureteral stone   . Orthostatic hypotension    negative tilt table  . OSA (obstructive sleep apnea)    moderate with AHI 17/hr, uses CPAP nightly  . PVC's (premature ventricular contractions) 07/07/2016  . Renal disorder    PT IS SEEING DR. Lorrene Reid- NEPHROLOGIST  . Rosacea   . Urticaria    Family History  Problem Relation Age of Onset  . Coronary artery disease Mother   . Heart attack Mother   . Heart disease Mother   . Hypertension Mother   . Cancer Father        lung and colon  . Lung cancer Father        smoker  . Diabetes Brother    Past Surgical History:  Procedure Laterality Date  . CARDIAC CATHETERIZATION  09/06/2013   [facility]  . CARDIAC CATHETERIZATION    . CORONARY ANGIOPLASTY  2004   STENT PLACEMENT  . CYSTOSCOPY WITH RETROGRADE PYELOGRAM, URETEROSCOPY AND STENT PLACEMENT Left 03/19/2014   Procedure: CYSTOSCOPY WITH RETROGRADE PYELOGRAM, URETEROSCOPY AND STENT PLACEMENT;  Surgeon: Junious Dresser, MD;  Location[facility];  Service: Urology;  Laterality: Left;  . CYSTOSCOPY WITH RETROGRADE PYELOGRAM, URETEROSCOPY AND STENT PLACEMENT Bilateral 05/20/2015   Procedure:  CYSTOSCOPY WITH BILATERAL RETROGRADE PYELOGRAM,RIGHT  DIAGNOSTIC URETEROSCOPY ,LEFT URETEROSCOPY WITH HOLMIUM LASER  AND BILATERAL  STENT PLACEMENT ;  Surgeon: Alexis Frock, MD;  Location: [facility];  Service: Urology;  Laterality: Bilateral;  . DRUG INDUCED ENDOSCOPY N/A 10/20/2017   Procedure: DRUG INDUCED SLEEP ENDOSCOPY;  Surgeon: Melida Quitter, MD;  Location: [facility];  Service: ENT;  Laterality: N/A;  . EP IMPLANTABLE DEVICE N/A 05/13/2016   Procedure: Loop Recorder Insertion;  Surgeon: Deboraha Sprang, MD;  Location: [place] [facility];  Service: Cardiovascular;  Laterality: N/A;  . HOLMIUM LASER APPLICATION Bilateral 12/17/9890   Procedure: HOLMIUM LASER APPLICATION;  Surgeon: Alexis Frock, MD;  [facility];  Service: Urology;  Laterality: Bilateral;  . IMPLIMENTATION OF A HYPOGLOSSAL NERVE STIMULATOR  12/08/2017   [facility]   . LEFT HEART CATHETERIZATION WITH CORONARY ANGIOGRAM N/A 09/06/2013   Procedure: LEFT HEART CATHETERIZATION WITH CORONARY ANGIOGRAM;  Surgeon: Sinclair Grooms, MD;  Location: [facility];  Service: Cardiovascular;  Laterality: N/A;  . LEFT KNEE CAP  SURGERY ABOUT 40 YRS AGO  ? 1970    . STONE  EXTRACTION WITH BASKET Left 03/19/2014   Procedure: STONE EXTRACTION WITH BASKET;  Surgeon: Junious Dresser, MD;  [facility];  Service: Urology;  Laterality: Left;   Social History   Occupational History  . Occupation: retired Print production planner  Tobacco Use  . Smoking status: Never Smoker  . Smokeless tobacco: Never Used  Substance and Sexual Activity  . Alcohol use: Yes    Alcohol/week: 1.0 standard drinks    Types: 1 Cans of beer per week    Comment: Occasional beer  . Drug use: No  . Sexual activity: Yes    Partners: Female    Birth control/protection: None

## 2018-04-16 NOTE — Progress Notes (Signed)
[name] - 68 y.o. male MRN [id number]  Date of birth: 07-May-1950  Office Visit Note: Visit Date: 03/20/2018 PCP: Jinny Sanders, MD Referred by: Jinny Sanders, MD  Subjective: Chief Complaint  Patient presents with  . Middle Back - Pain  . Lower Back - Pain  . Left Leg - Tingling  . Left Foot - Tingling   HPI:  Shawn Lam is a 68 y.o. male who comes in today For planned medial branch blocks in the thoracic spine for worsening thoracic spine pain with history of multiple compression fractures.  Patient notes that prior trigger point injections were not beneficial.  Patient still has upper thoracic pain really above the T11 area which was a concern on the newer x-ray when he was seen here for consultation.  The T11 finding was very mild and more of a superior endplate issue and I think more of his problems are mechanical around the T7-T8 region.  We are going to complete facet joint blocks at the T7 and T8 levels.  This is based on the area of his pain.  If he does not get much relief at all may recommend repeat MRI since these are newer findings over the last few months.  Patient does report some tingling in the left leg and foot which was not noted on the prior consultation.  This is not really an issue with him overall that he did noted.  He had a prior issue with this in 2015 and did receive epidural steroid injection.  Lumbar spine MRI does not show any area of nerve compression or stenosis.  Thoracic MRI only shows very mild narrowing at T12 from prior To plasty and retropulsion.  ROS Otherwise per HPI.  Assessment & Plan: Visit Diagnoses:  1. Chronic midline thoracic back pain   2. Thoracic spondylosis   3. Compression fracture of T11 vertebra with routine healing, subsequent encounter   4. Compression fracture of T12 vertebra with routine healing, subsequent encounter     Plan: No additional findings.   Meds & Orders:  Meds ordered this encounter  Medications  .  bupivacaine (MARCAINE) 0.5 % (with pres) injection 3 mL  . betamethasone acetate-betamethasone sodium phosphate (CELESTONE) injection 12 mg    Orders Placed This Encounter  Procedures  . Facet Injection  . XR C-ARM NO REPORT    Follow-up: Return if symptoms worsen or fail to improve, for Review Pain Diary.   Procedures: No procedures performed  Thoracic Diagnostic Facet Joint Nerve Block with Fluoroscopic Guidance  Patient: Shawn Lam      Date of Birth: 25-Mar-1951 MRN: [id number] PCP: Jinny Sanders, MD      Visit Date: 03/20/2018   Universal Protocol:    Date/Time: 01/06/206:25 AM  Consent Given By: the patient  Position: PRONE   Additional Comments: Vital signs were monitored before and after the procedure. Patient was prepped and draped in the usual sterile fashion. The correct patient, procedure, and site was verified.   Injection Procedure Details:  Procedure Site One Meds Administered:  Meds ordered this encounter  Medications  . bupivacaine (MARCAINE) 0.5 % (with pres) injection 3 mL  . betamethasone acetate-betamethasone sodium phosphate (CELESTONE) injection 12 mg     Laterality: Bilateral  Location/Site:  T7-8 T8-9  Needle size: 22 G  Needle type: spinal needle   Findings:    -Comments: Excellent flow contrast over the pedicle shadows without intravascular flow.  Procedure Details: The fluoroscope beam is  vertically oriented and tilted cranially and caudally to square off the vertebral body endplates to achieve a true AP midline view.  The skin over the target area of the pedicle shadow ipsilateral to the desired medial branch nerve was locally anesthetized with a 1 ml volume of 1% Lidocaine without Epinephrine.  A 22 gauge spinal needle was inserted and advanced in a trajectory view down to the target.   After contact with periosteum and negative aspirate for blood and CSF, correct placement without intravascular or epidural spread was  confirmed by injecting 0.5 ml. of Omnipaque-240.  A spot radiograph was obtained of this image.  Next, a 0.5 ml. volume of the injectate described above was injected. The needle was then redirected to the other facet joint nerves mentioned above if needed.  Prior to the procedure, the patient was given a Pain Diary which was completed for baseline measurements.  After the procedure, the patient rated their pain every 30 minutes and will continue rating at this frequency for a total of 5 hours.  The patient has been asked to complete the Diary and return to [place] by mail, fax or hand delivered as soon as possible.   Additional Comments:  The patient tolerated the procedure well Dressing: Band-Aid    Post-procedure details: Patient was observed during the procedure. Post-procedure instructions were reviewed. Follow-Up Instructions: Given to the patient Patient left the clinic in stable condition.      Clinical History: THORACIC SPINE - 3 VIEWS  COMPARISON:  MRI thoracic spine dated April 17, 2017.  FINDINGS: Twelve rib-bearing thoracic vertebral bodies. Questionable new irregularity of the T11 superior endplate. Chronic T7, T8, and T9 compression fractures without further height loss. Unchanged moderate to severe T12 compression fracture status post kyphoplasty. Alignment is normal. Mild thoracic spondylosis is unchanged.  IMPRESSION: 1. Questionable new mild irregularity of the T11 superior endplate without significant height loss, potentially reflecting new compression fracture. Given tenderness in this area, consider MRI for definitive evaluation. 2. Chronic T7 through T9 compression fractures without further height loss. Unchanged moderate to severe T12 compression fracture status post kyphoplasty.   Electronically Signed   By: Titus Dubin M.D.   On: 02/09/2018 11:36  MRI THORACIC AND LUMBAR SPINE WITHOUT CONTRAST  FINDINGS: MRI THORACIC SPINE  FINDINGS  Limited cervical spine imaging: Negative.  Alignment: Mild straightening of thoracic kyphosis since 2015. No spondylolisthesis.  Vertebrae: Chronic T8 superior endplate compression, unchanged since the 2015 chest CT.  Previous T12 compression fracture with evidence of augmentation since May 2018.  T7 vertebral compression with superior and inferior endplate deformity. Up to 50% central loss of vertebral body height with patchy central vertebral marrow edema. The T7 pedicles and posterior elements appear remain intact. No retropulsion.  T9 vertebral body compression with superior endplate deformity and 62% loss of vertebral body height. Confluent T9 vertebral body marrow edema. No retropulsion. The T9 pedicles and posterior elements appear to remain intact.  Other thoracic levels appear intact and underlying thoracic bone marrow signal is within normal limits; only mildly heterogeneous on T1 weighted imaging.  Disc levels:  At the T12 level there is mild to moderate retropulsion of the posterosuperior T12 endplate resulting in mild spinal stenosis (series 8, image 36), but no spinal cord mass effect.  MRI LUMBAR SPINE FINDINGS  Vertebrae: Interval augmentation of the T12 compression fracture.  New mild superior endplate deformities of both L1 and L2, but only minimal to mild associated focal endplate marrow edema (series 12, image  10 and series 13, image 10). Minimal retropulsion, and the pedicles and posterior elements of both levels appear to remain intact.   Regression of bilateral sacral ala marrow edema since 2018 compatible with healing sacral insufficiency type fractures. Mild residual (series 13, image 17 on the left and image 1 on the right). Intact visible SI joints.  Conus medullaris and cauda equina: Conus extends to the L1 level. Conus and cauda equina appear normal.  Paraspinal and other soft tissues: Stable and negative  visualized abdominal viscera. Negative visualized posterior paraspinal soft tissues.  Disc levels:  Stable mild to moderate L3-L4 and L4-L5 disc degeneration with disc bulging. No lumbar spinal stenosis.  IMPRESSION: MR THORACIC SPINE IMPRESSION  1. Acute to subacute compression fractures of both the T7 and T9 vertebral bodies. 40-50% loss of height with no retropulsion or complicating features. 2. Augmented T12 compression fracture. Mild to moderate retropulsion at that level results in mild T12 level spinal stenosis, but no spinal cord mass effect or signal abnormality. 3. Chronic mild T8 compression fracture is stable since 2015.  MR LUMBAR SPINE IMPRESSION  1. Late subacute to early chronic appearing mild compression fractures of both the L1 and L2 superior endplates, new since 09/02/2016. No significant retropulsion or complicating features. 2. Healing and nearly resolved bilateral sacral insufficiency fractures. 3. The L3 through L5 levels remain intact.   Electronically Signed By: Genevie Ann M.D. On: 04/17/2017 18:36     Objective:  VS:  HT:    WT:   BMI:     BP:129/82  HR:74bpm  TEMP:98.5 F (36.9 C)(Oral)  RESP:  Physical Exam  Ortho Exam Imaging: No results found.

## 2018-04-18 DIAGNOSIS — G4733 Obstructive sleep apnea (adult) (pediatric): Secondary | ICD-10-CM | POA: Diagnosis not present

## 2018-04-20 ENCOUNTER — Other Ambulatory Visit: Payer: Self-pay | Admitting: Family Medicine

## 2018-04-20 NOTE — Telephone Encounter (Signed)
Last office visit 03/30/2018 with Shawn Lam for palpitations.  Last refilled 03/27/2017 for #90 with 1 refill by Dr. Rainey Pines.  Next Appt: 02/26/2019 for CPE.  Refill?

## 2018-04-22 LAB — CUP PACEART REMOTE DEVICE CHECK
Date Time Interrogation Session: 20191122091210
Implantable Pulse Generator Implant Date: 20180202

## 2018-04-24 DIAGNOSIS — G4733 Obstructive sleep apnea (adult) (pediatric): Secondary | ICD-10-CM | POA: Diagnosis not present

## 2018-04-30 ENCOUNTER — Other Ambulatory Visit: Payer: Self-pay | Admitting: Family Medicine

## 2018-04-30 NOTE — Telephone Encounter (Signed)
Last office visit 03/30/2018 with Glenda Chroman for Palpitations.  Last refilled Flexeril 03/28/2018 for #30 with no refills.  Diclofenac 04/10/2018 for #30 with no refills.  CPE scheduled 02/26/2019.

## 2018-05-07 ENCOUNTER — Ambulatory Visit (INDEPENDENT_AMBULATORY_CARE_PROVIDER_SITE_OTHER): Payer: Medicare Other

## 2018-05-07 DIAGNOSIS — R002 Palpitations: Secondary | ICD-10-CM

## 2018-05-08 LAB — CUP PACEART REMOTE DEVICE CHECK
Implantable Pulse Generator Implant Date: 20180202
MDC IDC SESS DTM: 20200127124227

## 2018-05-08 NOTE — Progress Notes (Signed)
[facility] Summary Report / Loop Recorder 

## 2018-05-17 ENCOUNTER — Other Ambulatory Visit: Payer: Self-pay | Admitting: Family Medicine

## 2018-05-17 NOTE — Telephone Encounter (Signed)
Last office visit 03/30/2018 with Dr. Carlean Purl for palpitations.  Last refilled 05/01/2018 for #30 with no refills.  Next appt: 02/26/2019 for CPE.

## 2018-05-22 ENCOUNTER — Telehealth: Payer: Self-pay | Admitting: Family Medicine

## 2018-05-22 DIAGNOSIS — E7800 Pure hypercholesterolemia, unspecified: Secondary | ICD-10-CM

## 2018-05-22 NOTE — Telephone Encounter (Signed)
-----   Message from Ellamae Sia sent at 05/15/2018  3:54 PM EST ----- Regarding: Lab orders for Thursday, 2.13.20 Lab orders, lipid?

## 2018-05-24 ENCOUNTER — Other Ambulatory Visit (INDEPENDENT_AMBULATORY_CARE_PROVIDER_SITE_OTHER): Payer: Medicare Other

## 2018-05-24 DIAGNOSIS — E7800 Pure hypercholesterolemia, unspecified: Secondary | ICD-10-CM | POA: Diagnosis not present

## 2018-05-24 LAB — COMPREHENSIVE METABOLIC PANEL
ALK PHOS: 77 U/L (ref 39–117)
ALT: 21 U/L (ref 0–53)
AST: 32 U/L (ref 0–37)
Albumin: 4 g/dL (ref 3.5–5.2)
BUN: 16 mg/dL (ref 6–23)
CO2: 29 mEq/L (ref 19–32)
Calcium: 9.3 mg/dL (ref 8.4–10.5)
Chloride: 106 mEq/L (ref 96–112)
Creatinine, Ser: 1.49 mg/dL (ref 0.40–1.50)
GFR: 46.91 mL/min — ABNORMAL LOW (ref >60.00)
Glucose, Bld: 90 mg/dL (ref 70–99)
POTASSIUM: 4.7 meq/L (ref 3.5–5.1)
Sodium: 141 mEq/L (ref 135–145)
Total Bilirubin: 0.8 mg/dL (ref 0.2–1.2)
Total Protein: 6.4 g/dL (ref 6.0–8.3)

## 2018-05-24 LAB — LIPID PANEL
Cholesterol: 146 mg/dL (ref 0–200)
HDL: 61.9 mg/dL (ref >39.00)
LDL Cholesterol: 75 mg/dL (ref 0–99)
NonHDL: 84.17
Total CHOL/HDL Ratio: 2
Triglycerides: 48 mg/dL (ref 0.0–149.0)
VLDL: 9.6 mg/dL (ref 0.0–40.0)

## 2018-05-30 ENCOUNTER — Telehealth: Payer: Self-pay | Admitting: Cardiology

## 2018-05-30 NOTE — Telephone Encounter (Signed)
Looks like Dr. Radford Pax ordered Midodrine. Will route to Harrietta Guardian RN for Dr. Radford Pax.

## 2018-05-30 NOTE — Telephone Encounter (Signed)
Spoke with the patient, he wanted to know if there were any alternative medications that were cheaper.

## 2018-05-30 NOTE — Telephone Encounter (Signed)
Florinef will cause more LE edema so would like to avoid that.  Could try Mestinon but would check with pharmacy first what the cost would be

## 2018-05-30 NOTE — Telephone Encounter (Signed)
New Message:     Pt wants to know if he can take something in the place of Midodrine please. He says it is too expensive.m

## 2018-05-31 NOTE — Telephone Encounter (Signed)
Spoke with the patient, he stated he would just continue his current medications. He had no further questions.

## 2018-06-01 ENCOUNTER — Other Ambulatory Visit: Payer: Self-pay | Admitting: Family Medicine

## 2018-06-01 NOTE — Telephone Encounter (Signed)
Last office visit 03/30/2018 with D. Carlean Purl to palpitations.  Last refilled 05/01/2018 for #30 with no refills.  CPE scheduled 02/26/2019.

## 2018-06-05 DIAGNOSIS — G4700 Insomnia, unspecified: Secondary | ICD-10-CM | POA: Diagnosis not present

## 2018-06-05 DIAGNOSIS — G4733 Obstructive sleep apnea (adult) (pediatric): Secondary | ICD-10-CM | POA: Diagnosis not present

## 2018-06-09 LAB — CUP PACEART REMOTE DEVICE CHECK
Date Time Interrogation Session: 20200229131046
Implantable Pulse Generator Implant Date: 20180202

## 2018-06-11 ENCOUNTER — Ambulatory Visit (INDEPENDENT_AMBULATORY_CARE_PROVIDER_SITE_OTHER): Payer: Medicare Other | Admitting: *Deleted

## 2018-06-11 DIAGNOSIS — R55 Syncope and collapse: Secondary | ICD-10-CM

## 2018-06-11 DIAGNOSIS — R002 Palpitations: Secondary | ICD-10-CM | POA: Diagnosis not present

## 2018-06-13 ENCOUNTER — Other Ambulatory Visit: Payer: Self-pay | Admitting: Family Medicine

## 2018-06-13 NOTE — Telephone Encounter (Signed)
Last office visit 03/30/2018 with Glenda Chroman for palpitations.  Last refilled 05/17/2018 for #60 with no refills.  CPE scheduled for 02/26/2019.

## 2018-06-14 ENCOUNTER — Encounter: Payer: Self-pay | Admitting: Cardiology

## 2018-06-18 NOTE — Progress Notes (Signed)
[facility] Summary Report / Loop Recorder 

## 2018-06-27 ENCOUNTER — Telehealth: Payer: Self-pay | Admitting: Cardiology

## 2018-06-27 NOTE — Telephone Encounter (Signed)
Patient wants to know if he should continue to work part-time with the virus that is going on.

## 2018-06-28 NOTE — Telephone Encounter (Signed)
Needs to speak with his PCP

## 2018-06-28 NOTE — Telephone Encounter (Signed)
Spoke with the patient, he expressed understanding and had no further questions.

## 2018-06-28 NOTE — Telephone Encounter (Signed)
Spoke with the patient, he wanted to know if we would write him a note to stay out of work until April? He did not know if he medically high risk.

## 2018-07-05 ENCOUNTER — Other Ambulatory Visit: Payer: Self-pay | Admitting: Family Medicine

## 2018-07-05 NOTE — Telephone Encounter (Signed)
Last office visit 03/30/2018 with Glenda Chroman for palpitations.  Last refilled 06/04/2018 for #30 with no refills.  CPE scheduled 02/26/2019.

## 2018-07-06 ENCOUNTER — Other Ambulatory Visit: Payer: Self-pay | Admitting: Family Medicine

## 2018-07-06 NOTE — Telephone Encounter (Signed)
Last office visit 03/30/2018 with Glenda Chroman for palpitations.  Last refilled 06/13/2018 for #60 with no refills.  CPE scheduled 02/26/2019.

## 2018-07-12 ENCOUNTER — Ambulatory Visit (INDEPENDENT_AMBULATORY_CARE_PROVIDER_SITE_OTHER): Payer: Medicare Other | Admitting: *Deleted

## 2018-07-12 ENCOUNTER — Other Ambulatory Visit: Payer: Self-pay

## 2018-07-12 ENCOUNTER — Telehealth: Payer: Self-pay | Admitting: Cardiovascular Disease

## 2018-07-12 DIAGNOSIS — R002 Palpitations: Secondary | ICD-10-CM

## 2018-07-12 LAB — CUP PACEART REMOTE DEVICE CHECK
Date Time Interrogation Session: 20200402141058
Implantable Pulse Generator Implant Date: 20180202

## 2018-07-12 NOTE — Telephone Encounter (Signed)
Patient called and discussed cancelling elective echocardiogram due to concerns for Covid19 and need for social distancing . Patient stable with no urgent symptoms.  TTE scheduled for 4/15 can be rescheduled for 3-6 months   

## 2018-07-20 NOTE — Progress Notes (Signed)
[facility] Summary Report / Loop Recorder 

## 2018-07-25 ENCOUNTER — Other Ambulatory Visit (HOSPITAL_COMMUNITY): Payer: Medicare Other

## 2018-07-30 ENCOUNTER — Other Ambulatory Visit: Payer: Self-pay | Admitting: Family Medicine

## 2018-07-30 NOTE — Telephone Encounter (Signed)
Last office visit 03/30/2018 with Glenda Chroman for Palpitations.  Last refilled 07/06/2018 for #50 with no refills.  CPE scheduled for 02/26/2019.

## 2018-07-31 DIAGNOSIS — Z125 Encounter for screening for malignant neoplasm of prostate: Secondary | ICD-10-CM | POA: Diagnosis not present

## 2018-07-31 DIAGNOSIS — R82991 Hypocitraturia: Secondary | ICD-10-CM | POA: Diagnosis not present

## 2018-08-02 ENCOUNTER — Other Ambulatory Visit: Payer: Self-pay | Admitting: Family Medicine

## 2018-08-03 NOTE — Telephone Encounter (Signed)
Last office visit 03/30/2018 with Dr. Carlean Purl for palpitations.  Last refilled 07/05/2018 for #30 with no refills.  CPE scheduled for 02/26/2019.

## 2018-08-07 DIAGNOSIS — R3915 Urgency of urination: Secondary | ICD-10-CM | POA: Diagnosis not present

## 2018-08-07 DIAGNOSIS — N200 Calculus of kidney: Secondary | ICD-10-CM | POA: Diagnosis not present

## 2018-08-08 DIAGNOSIS — G4733 Obstructive sleep apnea (adult) (pediatric): Secondary | ICD-10-CM | POA: Diagnosis not present

## 2018-08-10 DIAGNOSIS — G4700 Insomnia, unspecified: Secondary | ICD-10-CM | POA: Diagnosis not present

## 2018-08-10 DIAGNOSIS — G4733 Obstructive sleep apnea (adult) (pediatric): Secondary | ICD-10-CM | POA: Diagnosis not present

## 2018-08-14 ENCOUNTER — Other Ambulatory Visit: Payer: Self-pay

## 2018-08-14 ENCOUNTER — Ambulatory Visit (INDEPENDENT_AMBULATORY_CARE_PROVIDER_SITE_OTHER): Payer: Medicare Other | Admitting: *Deleted

## 2018-08-14 DIAGNOSIS — R55 Syncope and collapse: Secondary | ICD-10-CM

## 2018-08-14 LAB — CUP PACEART REMOTE DEVICE CHECK
Date Time Interrogation Session: 20200505144154
Implantable Pulse Generator Implant Date: 20180202

## 2018-08-21 NOTE — Progress Notes (Signed)
[facility] Summary Report / Loop Recorder 

## 2018-08-23 DIAGNOSIS — D1800 Hemangioma unspecified site: Secondary | ICD-10-CM | POA: Diagnosis not present

## 2018-08-23 DIAGNOSIS — D692 Other nonthrombocytopenic purpura: Secondary | ICD-10-CM | POA: Diagnosis not present

## 2018-08-23 DIAGNOSIS — L570 Actinic keratosis: Secondary | ICD-10-CM | POA: Diagnosis not present

## 2018-08-23 DIAGNOSIS — D2260 Melanocytic nevi of unspecified upper limb, including shoulder: Secondary | ICD-10-CM | POA: Diagnosis not present

## 2018-08-23 DIAGNOSIS — D485 Neoplasm of uncertain behavior of skin: Secondary | ICD-10-CM | POA: Diagnosis not present

## 2018-08-23 DIAGNOSIS — L821 Other seborrheic keratosis: Secondary | ICD-10-CM | POA: Diagnosis not present

## 2018-08-23 DIAGNOSIS — D225 Melanocytic nevi of trunk: Secondary | ICD-10-CM | POA: Diagnosis not present

## 2018-08-23 DIAGNOSIS — L812 Freckles: Secondary | ICD-10-CM | POA: Diagnosis not present

## 2018-08-23 DIAGNOSIS — L820 Inflamed seborrheic keratosis: Secondary | ICD-10-CM | POA: Diagnosis not present

## 2018-08-23 DIAGNOSIS — L578 Other skin changes due to chronic exposure to nonionizing radiation: Secondary | ICD-10-CM | POA: Diagnosis not present

## 2018-08-23 DIAGNOSIS — Z85828 Personal history of other malignant neoplasm of skin: Secondary | ICD-10-CM | POA: Diagnosis not present

## 2018-08-23 DIAGNOSIS — Z1283 Encounter for screening for malignant neoplasm of skin: Secondary | ICD-10-CM | POA: Diagnosis not present

## 2018-08-25 ENCOUNTER — Other Ambulatory Visit: Payer: Self-pay | Admitting: Family Medicine

## 2018-08-27 NOTE — Telephone Encounter (Signed)
Last office visit 03/30/2018 for palpitations with D. Carlean Purl.  Last refilled 07/30/2018 for #60 with no refills.  CPE scheduled for 02/26/2019.

## 2018-08-29 ENCOUNTER — Other Ambulatory Visit: Payer: Self-pay | Admitting: Family Medicine

## 2018-08-29 NOTE — Telephone Encounter (Signed)
Last office visit 03/30/2018 with Glenda Chroman for palpitations.  Last refilled 08/03/2018 for #30 with no refills.  CPE scheduled for 02/26/2019.

## 2018-09-11 DIAGNOSIS — I2510 Atherosclerotic heart disease of native coronary artery without angina pectoris: Secondary | ICD-10-CM | POA: Diagnosis not present

## 2018-09-11 DIAGNOSIS — I951 Orthostatic hypotension: Secondary | ICD-10-CM | POA: Diagnosis not present

## 2018-09-11 DIAGNOSIS — E611 Iron deficiency: Secondary | ICD-10-CM | POA: Diagnosis not present

## 2018-09-11 DIAGNOSIS — R609 Edema, unspecified: Secondary | ICD-10-CM | POA: Diagnosis not present

## 2018-09-11 DIAGNOSIS — N183 Chronic kidney disease, stage 3 (moderate): Secondary | ICD-10-CM | POA: Diagnosis not present

## 2018-09-11 DIAGNOSIS — M4850XA Collapsed vertebra, not elsewhere classified, site unspecified, initial encounter for fracture: Secondary | ICD-10-CM | POA: Diagnosis not present

## 2018-09-11 DIAGNOSIS — N200 Calculus of kidney: Secondary | ICD-10-CM | POA: Diagnosis not present

## 2018-09-11 DIAGNOSIS — M810 Age-related osteoporosis without current pathological fracture: Secondary | ICD-10-CM | POA: Diagnosis not present

## 2018-09-17 ENCOUNTER — Ambulatory Visit (INDEPENDENT_AMBULATORY_CARE_PROVIDER_SITE_OTHER): Payer: Medicare Other | Admitting: *Deleted

## 2018-09-17 DIAGNOSIS — R55 Syncope and collapse: Secondary | ICD-10-CM

## 2018-09-17 DIAGNOSIS — R002 Palpitations: Secondary | ICD-10-CM | POA: Diagnosis not present

## 2018-09-17 LAB — CUP PACEART REMOTE DEVICE CHECK
Date Time Interrogation Session: 20200607153912
Implantable Pulse Generator Implant Date: 20180202

## 2018-09-25 NOTE — Progress Notes (Signed)
[facility] Summary Report / Loop Recorder 

## 2018-09-29 ENCOUNTER — Other Ambulatory Visit: Payer: Self-pay | Admitting: Family Medicine

## 2018-09-30 ENCOUNTER — Other Ambulatory Visit: Payer: Self-pay | Admitting: Family Medicine

## 2018-10-01 NOTE — Telephone Encounter (Signed)
Last office visit 03/30/2018 with Glenda Chroman for Palpitations.  Last refilled 08/28/2018 for #60 with no refills.  CPE scheduled for 02/26/2019

## 2018-10-01 NOTE — Telephone Encounter (Signed)
Last office visit 03/30/2018 with Shawn Lam for Palpitations.  Last refilled 08/29/2018 for #30 with no refills.  CPE scheduled for 02/26/2019.

## 2018-10-02 DIAGNOSIS — J343 Hypertrophy of nasal turbinates: Secondary | ICD-10-CM | POA: Diagnosis not present

## 2018-10-02 DIAGNOSIS — H9313 Tinnitus, bilateral: Secondary | ICD-10-CM | POA: Insufficient documentation

## 2018-10-02 DIAGNOSIS — G4733 Obstructive sleep apnea (adult) (pediatric): Secondary | ICD-10-CM | POA: Diagnosis not present

## 2018-10-02 DIAGNOSIS — R040 Epistaxis: Secondary | ICD-10-CM | POA: Insufficient documentation

## 2018-10-05 DIAGNOSIS — I10 Essential (primary) hypertension: Secondary | ICD-10-CM | POA: Diagnosis not present

## 2018-10-05 DIAGNOSIS — Z961 Presence of intraocular lens: Secondary | ICD-10-CM | POA: Diagnosis not present

## 2018-10-05 DIAGNOSIS — H40013 Open angle with borderline findings, low risk, bilateral: Secondary | ICD-10-CM | POA: Diagnosis not present

## 2018-10-05 DIAGNOSIS — H53453 Other localized visual field defect, bilateral: Secondary | ICD-10-CM | POA: Diagnosis not present

## 2018-10-14 ENCOUNTER — Other Ambulatory Visit: Payer: Self-pay | Admitting: Family Medicine

## 2018-10-15 NOTE — Telephone Encounter (Signed)
Last office visit 03/30/2018 for Shawn Lam for palpitations.  Last refilled 04/20/2018 for #90 with 1 refill.  CPE scheduled for 02/26/2019.

## 2018-10-19 ENCOUNTER — Ambulatory Visit (INDEPENDENT_AMBULATORY_CARE_PROVIDER_SITE_OTHER): Payer: Medicare Other | Admitting: *Deleted

## 2018-10-19 DIAGNOSIS — R002 Palpitations: Secondary | ICD-10-CM | POA: Diagnosis not present

## 2018-10-19 LAB — CUP PACEART REMOTE DEVICE CHECK
Date Time Interrogation Session: 20200710163821
Implantable Pulse Generator Implant Date: 20180202

## 2018-10-22 DIAGNOSIS — M25562 Pain in left knee: Secondary | ICD-10-CM | POA: Diagnosis not present

## 2018-10-22 DIAGNOSIS — M25561 Pain in right knee: Secondary | ICD-10-CM | POA: Diagnosis not present

## 2018-10-22 DIAGNOSIS — M170 Bilateral primary osteoarthritis of knee: Secondary | ICD-10-CM | POA: Diagnosis not present

## 2018-10-23 NOTE — Progress Notes (Signed)
[facility] Summary Report / Loop Recorder 

## 2018-10-27 ENCOUNTER — Other Ambulatory Visit: Payer: Self-pay | Admitting: Family Medicine

## 2018-10-29 NOTE — Telephone Encounter (Signed)
Last office visit 03/30/2018 with Glenda Chroman for Palpitations. Last refilled 10/01/2018 for #60 with no refills. CPE scheduled for 02/26/2019

## 2018-11-01 ENCOUNTER — Other Ambulatory Visit: Payer: Self-pay | Admitting: Family Medicine

## 2018-11-01 NOTE — Telephone Encounter (Signed)
Flexeril Last filled:  10/01/18, #30 Last OV:  03/30/18, acute Next OV:  03/08/19, AWV

## 2018-11-07 ENCOUNTER — Other Ambulatory Visit: Payer: Self-pay | Admitting: Cardiology

## 2018-11-09 ENCOUNTER — Encounter: Payer: Self-pay | Admitting: Family Medicine

## 2018-11-09 ENCOUNTER — Other Ambulatory Visit: Payer: Self-pay

## 2018-11-09 ENCOUNTER — Ambulatory Visit (INDEPENDENT_AMBULATORY_CARE_PROVIDER_SITE_OTHER): Payer: Medicare Other | Admitting: Family Medicine

## 2018-11-09 VITALS — BP 120/76 | HR 70 | Temp 98.0°F | Ht 72.0 in | Wt 179.0 lb

## 2018-11-09 DIAGNOSIS — R195 Other fecal abnormalities: Secondary | ICD-10-CM | POA: Insufficient documentation

## 2018-11-09 DIAGNOSIS — M25561 Pain in right knee: Secondary | ICD-10-CM

## 2018-11-09 DIAGNOSIS — M25562 Pain in left knee: Secondary | ICD-10-CM | POA: Diagnosis not present

## 2018-11-09 DIAGNOSIS — D539 Nutritional anemia, unspecified: Secondary | ICD-10-CM | POA: Diagnosis not present

## 2018-11-09 DIAGNOSIS — G8929 Other chronic pain: Secondary | ICD-10-CM

## 2018-11-09 LAB — IBC + FERRITIN
Ferritin: 170.6 ng/mL (ref 22.0–322.0)
Iron: 67 ug/dL (ref 42–165)
Saturation Ratios: 20.7 % (ref 20.0–50.0)
Transferrin: 231 mg/dL (ref 212.0–360.0)

## 2018-11-09 LAB — CBC WITH DIFFERENTIAL/PLATELET
Basophils Absolute: 0.1 10*3/uL (ref 0.0–0.1)
Basophils Relative: 1.1 % (ref 0.0–3.0)
Eosinophils Absolute: 0.7 10*3/uL (ref 0.0–0.7)
Eosinophils Relative: 8.1 % — ABNORMAL HIGH (ref 0.0–5.0)
HCT: 41.4 % (ref 39.0–52.0)
Hemoglobin: 13.6 g/dL (ref 13.0–17.0)
Lymphocytes Relative: 17.8 % (ref 12.0–46.0)
Lymphs Abs: 1.6 10*3/uL (ref 0.7–4.0)
MCHC: 32.9 g/dL (ref 30.0–36.0)
MCV: 100.8 fl — ABNORMAL HIGH (ref 78.0–100.0)
Monocytes Absolute: 1 10*3/uL (ref 0.1–1.0)
Monocytes Relative: 11 % (ref 3.0–12.0)
Neutro Abs: 5.4 10*3/uL (ref 1.4–7.7)
Neutrophils Relative %: 62 % (ref 43.0–77.0)
Platelets: 239 10*3/uL (ref 150.0–400.0)
RBC: 4.11 Mil/uL — ABNORMAL LOW (ref 4.22–5.81)
RDW: 14.9 % (ref 11.5–15.5)
WBC: 8.8 10*3/uL (ref 4.0–10.5)

## 2018-11-09 MED ORDER — DICLOFENAC SODIUM 1 % TD GEL: 4.0000 g | Freq: Four times a day (QID) | TRANSDERMAL | 3 refills | Status: DC

## 2018-11-09 NOTE — Progress Notes (Signed)
Chief Complaint  Patient presents with  . Dark Stools    History of Present Illness: HPI    68 year old male presetns with dark stool. He reports dark black stools continuously for   The last year. Family concerned and asked him to check it out.  Rare low abd pain(distended feeling) or diarrhea.  No emesis, no nausea, occ rare heartburn.   He is taking ferrous sulfate 325 mg three times daily.   Colonoscopy:  2014  [facility]: normal/internal hemorrhoids. No past upper endoscopy.   HX:  Macrocytic anemia felt due to past hematuria  On ASA 81 mg and plavix   He has been seeing orthopedics for chronic knee pain. OA seen on  X-ray.  Cortisone  Shots bilaterally 4 weeks ago.. helped minimally  he has been using diclofenac 75 mg THREE TIMES DAILY. Walking to exercise.  COVID 19 screen No recent travel or known exposure to COVID19 The patient denies respiratory symptoms of COVID 19 at this time.  The importance of social distancing was discussed today.   Review of Systems  Constitutional: Negative for chills and fever.  HENT: Negative for congestion and ear pain.   Eyes: Negative for pain and redness.  Respiratory: Negative for cough and shortness of breath.   Cardiovascular: Negative for chest pain, palpitations and leg swelling.  Gastrointestinal: Negative for abdominal pain, blood in stool, constipation, diarrhea, nausea and vomiting.  Genitourinary: Negative for dysuria.  Musculoskeletal: Negative for falls and myalgias.  Skin: Negative for rash.  Neurological: Negative for dizziness.  Psychiatric/Behavioral: Negative for depression. The patient is not nervous/anxious.       Past Medical History:  Diagnosis Date  . Anemia   . Anxiety   . Asymptomatic LV dysfunction    50-55% by echo 06/2016  . Berger's disease   . CAD (coronary artery disease) 06/2005   PCI of LAD and RCA  . Depression   . Dilated aortic root (HCC)    4cm at sinus of Valsalva  . Hematuria   .  Hyperlipidemia   . Hypothyroidism   . Insomnia   . Left ureteral stone   . Orthostatic hypotension    negative tilt table  . OSA (obstructive sleep apnea)    moderate with AHI 17/hr, uses CPAP nightly  . PVC's (premature ventricular contractions) 07/07/2016  . Renal disorder    PT IS SEEING DR. Lorrene Reid- NEPHROLOGIST  . Rosacea   . Urticaria     reports that he has never smoked. He has never used smokeless tobacco. He reports current alcohol use of about 1.0 standard drinks of alcohol per week. He reports that he does not use drugs.   Current Outpatient Medications:  .  alendronate (FOSAMAX) 70 MG tablet, Take 1 tablet (70 mg total) by mouth every 7 (seven) days. Take with a full glass of water on an empty stomach., Disp: 4 tablet, Rfl: 11 .  aspirin EC 81 MG tablet, Take 81 mg by mouth at bedtime. , Disp: , Rfl:  .  atorvastatin (LIPITOR) 80 MG tablet, Take 1 tablet (80 mg total) by mouth daily., Disp: 90 tablet, Rfl: 3 .  clopidogrel (PLAVIX) 75 MG tablet, TAKE 1 TABLET DAILY, Disp: 90 tablet, Rfl: 0 .  cyclobenzaprine (FLEXERIL) 10 MG tablet, TAKE 1/2-1 TAB BY MOUTH AT BEDTIME AS NEEDED FOR MUSCLE SPASMS, Disp: 30 tablet, Rfl: 0 .  diclofenac (VOLTAREN) 75 MG EC tablet, TAKE 1 TABLET BY MOUTH TWICE A DAY, Disp: 60 tablet, Rfl: 0 .  ferrous sulfate 325 (65 FE) MG tablet, TAKE 1 TABLET BY MOUTH 3 TIMES DAILY WITH MEALS., Disp: 270 tablet, Rfl: 3 .  fludrocortisone (FLORINEF) 0.1 MG tablet, Take 1 tablet (0.1 mg total) by mouth daily., Disp: 30 tablet, Rfl: 11 .  Fluocinolone Acetonide Body 0.01 % OIL, APPLY 1 ML ON THE SKIN TWICE A DAY, Disp: , Rfl: 0 .  FLUoxetine (PROZAC) 20 MG capsule, TAKE 1 CAPSULE DAILY. TAKE WITH 40MG  TO EQUAL 60MG     ONCE DAILY, Disp: 90 capsule, Rfl: 1 .  FLUoxetine (PROZAC) 40 MG capsule, TAKE 1 CAPSULE EVERY       MORNING, Disp: 90 capsule, Rfl: 1 .  furosemide (LASIX) 20 MG tablet, TAKE 1 TABLET DAILY, Disp: 90 tablet, Rfl: 3 .  hydrOXYzine (ATARAX/VISTARIL) 25  MG tablet, TAKE 1 TABLET BY MOUTH AT BEDTIME MAY MAKE DROWSY, Disp: , Rfl: 3 .  levothyroxine (SYNTHROID, LEVOTHROID) 25 MCG tablet, Take 1 tablet (25 mcg total) by mouth daily before breakfast., Disp: 90 tablet, Rfl: 3 .  midodrine (PROAMATINE) 5 MG tablet, TAKE 1 TABLET (5 MG TOTAL) BY MOUTH 2 (TWO) TIMES DAILY WITH BREAKFAST AND LUNCH., Disp: 180 tablet, Rfl: 3 .  mirtazapine (REMERON) 15 MG tablet, TAKE 1 TABLET BY MOUTH EVERYDAY AT BEDTIME, Disp: 90 tablet, Rfl: 1 .  nitroGLYCERIN (NITROSTAT) 0.4 MG SL tablet, Place 1 tablet (0.4 mg total) under the tongue every 5 (five) minutes as needed for chest pain., Disp: 10 tablet, Rfl: 1 .  Teriparatide, Recombinant, (FORTEO) 600 MCG/2.4ML SOLN, Inject 20 mcg daily into the skin. , Disp: , Rfl:  .  vitamin B-12 (CYANOCOBALAMIN) 1000 MCG tablet, Take 1,000 mcg by mouth daily. , Disp: , Rfl:  .  zolpidem (AMBIEN) 5 MG tablet, TAKE 1 TABLET BY MOUTH AT BEDTIME AS NEEDED FOR SLEEP, Disp: , Rfl:    Observations/Objective: Blood pressure 120/76, pulse 70, temperature 98 F (36.7 C), temperature source Temporal, height 6' (1.829 m), weight 179 lb (81.2 kg), SpO2 98 %.  Physical Exam Exam conducted with a chaperone present.  Constitutional:      Appearance: He is well-developed.  HENT:     Head: Normocephalic.     Right Ear: Hearing normal.     Left Ear: Hearing normal.     Nose: Nose normal.  Neck:     Thyroid: No thyroid mass or thyromegaly.     Vascular: No carotid bruit.     Trachea: Trachea normal.  Cardiovascular:     Rate and Rhythm: Normal rate and regular rhythm.     Pulses: Normal pulses.     Heart sounds: Heart sounds not distant. No murmur. No friction rub. No gallop.      Comments: No peripheral edema Pulmonary:     Effort: Pulmonary effort is normal. No respiratory distress.     Breath sounds: Normal breath sounds.  Genitourinary:    Prostate: Enlarged.     Rectum: Guaiac result negative. No mass, tenderness, anal fissure,  external hemorrhoid or internal hemorrhoid. Normal anal tone.  Skin:    General: Skin is warm and dry.     Findings: No rash.  Psychiatric:        Speech: Speech normal.        Behavior: Behavior normal.        Thought Content: Thought content normal.      Assessment and Plan   Macrocytic anemia Re-eval. ? Need for high dose iron at this point?  Bilateral chronic knee pain  Stop oral NSADIS given hx of CKD etc. Will treat with topical NSAIDS.  Dark stools Darker stool today.. negative hemeocciult.. ongoing for a while and pt without orher symptoms suggesting GI bleed/gastritis etc.  Stop NSAIDs given higher risk.  Hold iron for now to see if dark stool resolved. Eval for need to restart.   Will continue blood thinners for now as no definate GI bleed at this time.     [name], MD

## 2018-11-09 NOTE — Assessment & Plan Note (Signed)
Stop oral NSADIS given hx of CKD etc. Will treat with topical NSAIDS.

## 2018-11-09 NOTE — Assessment & Plan Note (Signed)
Re-eval. ? Need for high dose iron at this point?

## 2018-11-09 NOTE — Assessment & Plan Note (Signed)
Darker stool today.. negative hemeocciult.. ongoing for a while and pt without orher symptoms suggesting GI bleed/gastritis etc.  Stop NSAIDs given higher risk.  Hold iron for now to see if dark stool resolved. Eval for need to restart.   Will continue blood thinners for now as no definate GI bleed at this time.

## 2018-11-09 NOTE — Patient Instructions (Addendum)
Stop diclofenac or meloxicam. Only tylenol for knee pain.  Can try topical diclofenac gel.  Continue aspirin and plavix.  Hold iron for 1-2 weeks to see if stool returns to normal color.  Please stop at the lab to have labs drawn.

## 2018-11-21 ENCOUNTER — Ambulatory Visit (INDEPENDENT_AMBULATORY_CARE_PROVIDER_SITE_OTHER): Payer: Medicare Other | Admitting: *Deleted

## 2018-11-21 DIAGNOSIS — R002 Palpitations: Secondary | ICD-10-CM

## 2018-11-22 ENCOUNTER — Other Ambulatory Visit: Payer: Self-pay | Admitting: Family Medicine

## 2018-11-22 LAB — CUP PACEART REMOTE DEVICE CHECK
Date Time Interrogation Session: 20200812173623
Implantable Pulse Generator Implant Date: 20180202

## 2018-11-22 NOTE — Telephone Encounter (Signed)
Last office visit 11/09/2018 for dark stools.  Last refilled 10/30/2018 for #60 with no refills.  CPE scheduled for 02/26/2019.

## 2018-11-28 ENCOUNTER — Other Ambulatory Visit: Payer: Self-pay | Admitting: Family Medicine

## 2018-11-28 ENCOUNTER — Inpatient Hospital Stay
Admission: EM | Admit: 2018-11-28 | Discharge: 2018-12-01 | DRG: 313 | Disposition: A | Discharge status: Home / Self Care | Payer: Medicare Other | Attending: Specialist | Admitting: Specialist

## 2018-11-28 ENCOUNTER — Emergency Department: Payer: Medicare Other

## 2018-11-28 ENCOUNTER — Other Ambulatory Visit: Payer: Self-pay

## 2018-11-28 DIAGNOSIS — R0789 Other chest pain: Principal | ICD-10-CM

## 2018-11-28 DIAGNOSIS — K920 Hematemesis: Secondary | ICD-10-CM

## 2018-11-28 DIAGNOSIS — Z20828 Contact with and (suspected) exposure to other viral communicable diseases: Secondary | ICD-10-CM | POA: Diagnosis present

## 2018-11-28 DIAGNOSIS — R042 Hemoptysis: Secondary | ICD-10-CM | POA: Diagnosis not present

## 2018-11-28 DIAGNOSIS — K269 Duodenal ulcer, unspecified as acute or chronic, without hemorrhage or perforation: Secondary | ICD-10-CM

## 2018-11-28 DIAGNOSIS — Z79899 Other long term (current) drug therapy: Secondary | ICD-10-CM

## 2018-11-28 DIAGNOSIS — Z91048 Other nonmedicinal substance allergy status: Secondary | ICD-10-CM

## 2018-11-28 DIAGNOSIS — E785 Hyperlipidemia, unspecified: Secondary | ICD-10-CM | POA: Diagnosis not present

## 2018-11-28 DIAGNOSIS — D539 Nutritional anemia, unspecified: Secondary | ICD-10-CM | POA: Diagnosis present

## 2018-11-28 DIAGNOSIS — R0602 Shortness of breath: Secondary | ICD-10-CM | POA: Diagnosis not present

## 2018-11-28 DIAGNOSIS — Z7902 Long term (current) use of antithrombotics/antiplatelets: Secondary | ICD-10-CM

## 2018-11-28 DIAGNOSIS — D62 Acute posthemorrhagic anemia: Secondary | ICD-10-CM | POA: Diagnosis not present

## 2018-11-28 DIAGNOSIS — Z8249 Family history of ischemic heart disease and other diseases of the circulatory system: Secondary | ICD-10-CM

## 2018-11-28 DIAGNOSIS — Z7983 Long term (current) use of bisphosphonates: Secondary | ICD-10-CM

## 2018-11-28 DIAGNOSIS — I25110 Atherosclerotic heart disease of native coronary artery with unstable angina pectoris: Secondary | ICD-10-CM | POA: Diagnosis not present

## 2018-11-28 DIAGNOSIS — R55 Syncope and collapse: Secondary | ICD-10-CM

## 2018-11-28 DIAGNOSIS — R1110 Vomiting, unspecified: Secondary | ICD-10-CM

## 2018-11-28 DIAGNOSIS — K264 Chronic or unspecified duodenal ulcer with hemorrhage: Secondary | ICD-10-CM | POA: Diagnosis not present

## 2018-11-28 DIAGNOSIS — F329 Major depressive disorder, single episode, unspecified: Secondary | ICD-10-CM | POA: Diagnosis present

## 2018-11-28 DIAGNOSIS — R109 Unspecified abdominal pain: Secondary | ICD-10-CM

## 2018-11-28 DIAGNOSIS — G4733 Obstructive sleep apnea (adult) (pediatric): Secondary | ICD-10-CM | POA: Diagnosis present

## 2018-11-28 DIAGNOSIS — Z8719 Personal history of other diseases of the digestive system: Secondary | ICD-10-CM

## 2018-11-28 DIAGNOSIS — D509 Iron deficiency anemia, unspecified: Secondary | ICD-10-CM

## 2018-11-28 DIAGNOSIS — I469 Cardiac arrest, cause unspecified: Secondary | ICD-10-CM | POA: Diagnosis not present

## 2018-11-28 DIAGNOSIS — R0600 Dyspnea, unspecified: Secondary | ICD-10-CM

## 2018-11-28 DIAGNOSIS — I129 Hypertensive chronic kidney disease with stage 1 through stage 4 chronic kidney disease, or unspecified chronic kidney disease: Secondary | ICD-10-CM | POA: Diagnosis present

## 2018-11-28 DIAGNOSIS — G4700 Insomnia, unspecified: Secondary | ICD-10-CM | POA: Diagnosis present

## 2018-11-28 DIAGNOSIS — R079 Chest pain, unspecified: Secondary | ICD-10-CM | POA: Diagnosis not present

## 2018-11-28 DIAGNOSIS — N289 Disorder of kidney and ureter, unspecified: Secondary | ICD-10-CM | POA: Diagnosis present

## 2018-11-28 DIAGNOSIS — E039 Hypothyroidism, unspecified: Secondary | ICD-10-CM | POA: Diagnosis present

## 2018-11-28 DIAGNOSIS — F419 Anxiety disorder, unspecified: Secondary | ICD-10-CM | POA: Diagnosis present

## 2018-11-28 DIAGNOSIS — N183 Chronic kidney disease, stage 3 unspecified: Secondary | ICD-10-CM

## 2018-11-28 DIAGNOSIS — I951 Orthostatic hypotension: Secondary | ICD-10-CM | POA: Diagnosis present

## 2018-11-28 DIAGNOSIS — D7589 Other specified diseases of blood and blood-forming organs: Secondary | ICD-10-CM | POA: Diagnosis present

## 2018-11-28 DIAGNOSIS — Z87442 Personal history of urinary calculi: Secondary | ICD-10-CM

## 2018-11-28 DIAGNOSIS — I10 Essential (primary) hypertension: Secondary | ICD-10-CM | POA: Diagnosis not present

## 2018-11-28 DIAGNOSIS — Z885 Allergy status to narcotic agent status: Secondary | ICD-10-CM

## 2018-11-28 DIAGNOSIS — N1832 Chronic kidney disease, stage 3b: Secondary | ICD-10-CM

## 2018-11-28 DIAGNOSIS — Z79890 Hormone replacement therapy (postmenopausal): Secondary | ICD-10-CM

## 2018-11-28 DIAGNOSIS — Z955 Presence of coronary angioplasty implant and graft: Secondary | ICD-10-CM

## 2018-11-28 DIAGNOSIS — D649 Anemia, unspecified: Secondary | ICD-10-CM | POA: Diagnosis not present

## 2018-11-28 DIAGNOSIS — G8929 Other chronic pain: Secondary | ICD-10-CM | POA: Diagnosis present

## 2018-11-28 DIAGNOSIS — M810 Age-related osteoporosis without current pathological fracture: Secondary | ICD-10-CM | POA: Diagnosis present

## 2018-11-28 DIAGNOSIS — K219 Gastro-esophageal reflux disease without esophagitis: Secondary | ICD-10-CM | POA: Diagnosis present

## 2018-11-28 LAB — BASIC METABOLIC PANEL
Anion gap: 9 (ref 5–15)
BUN: 62 mg/dL — ABNORMAL HIGH (ref 8–23)
CO2: 21 mmol/L — ABNORMAL LOW (ref 22–32)
Calcium: 9.2 mg/dL (ref 8.9–10.3)
Chloride: 105 mmol/L (ref 98–111)
Creatinine, Ser: 1.55 mg/dL — ABNORMAL HIGH (ref 0.61–1.24)
GFR calc Af Amer: 53 mL/min — ABNORMAL LOW (ref >60)
GFR calc non Af Amer: 45 mL/min — ABNORMAL LOW (ref >60)
Glucose, Bld: 155 mg/dL — ABNORMAL HIGH (ref 70–99)
Potassium: 4.9 mmol/L (ref 3.5–5.1)
Sodium: 135 mmol/L (ref 135–145)

## 2018-11-28 LAB — TROPONIN I (HIGH SENSITIVITY)
Troponin I (High Sensitivity): 5 ng/L (ref <18)
Troponin I (High Sensitivity): 6 ng/L (ref <18)

## 2018-11-28 LAB — CBC
HCT: 34.7 % — ABNORMAL LOW (ref 39.0–52.0)
Hemoglobin: 11.2 g/dL — ABNORMAL LOW (ref 13.0–17.0)
MCH: 32.6 pg (ref 26.0–34.0)
MCHC: 32.3 g/dL (ref 30.0–36.0)
MCV: 100.9 fL — ABNORMAL HIGH (ref 80.0–100.0)
Platelets: 252 10*3/uL (ref 150–400)
RBC: 3.44 MIL/uL — ABNORMAL LOW (ref 4.22–5.81)
RDW: 14.1 % (ref 11.5–15.5)
WBC: 8.2 10*3/uL (ref 4.0–10.5)
nRBC: 0 % (ref 0.0–0.2)

## 2018-11-28 LAB — FIBRIN DERIVATIVES D-DIMER (ARMC ONLY): Fibrin derivatives D-dimer (ARMC): 1047.6 ng/mL (FEU) — ABNORMAL HIGH (ref 0.00–499.00)

## 2018-11-28 LAB — SARS CORONAVIRUS 2 (TAT 6-24 HRS): SARS Coronavirus 2: NEGATIVE

## 2018-11-28 MED ORDER — MIRTAZAPINE 15 MG PO TABS
15.0000 mg | ORAL_TABLET | Freq: Every day | ORAL | Status: DC
  Administered 2018-11-28 – 2018-11-30 (×3): 15 mg via ORAL
  Filled 2018-11-28 (×3): qty 1

## 2018-11-28 MED ORDER — SODIUM CHLORIDE 0.9% FLUSH: 3.0000 mL | INTRAVENOUS | Status: DC | PRN

## 2018-11-28 MED ORDER — SODIUM CHLORIDE 0.9 % IV BOLUS
500.0000 mL | Freq: Once | INTRAVENOUS | Status: AC
  Administered 2018-11-28: 500 mL via INTRAVENOUS

## 2018-11-28 MED ORDER — HYDROCODONE-ACETAMINOPHEN 5-325 MG PO TABS
1.0000 | ORAL_TABLET | ORAL | Status: DC | PRN
  Administered 2018-11-29 (×2): 1 via ORAL
  Filled 2018-11-28 (×2): qty 1

## 2018-11-28 MED ORDER — ACETAMINOPHEN 325 MG PO TABS: 650.0000 mg | ORAL_TABLET | Freq: Four times a day (QID) | ORAL | Status: DC | PRN

## 2018-11-28 MED ORDER — ATORVASTATIN CALCIUM 80 MG PO TABS
80.0000 mg | ORAL_TABLET | Freq: Every day | ORAL | Status: DC
  Administered 2018-11-28 – 2018-12-01 (×3): 80 mg via ORAL
  Filled 2018-11-28 (×3): qty 1

## 2018-11-28 MED ORDER — MIDODRINE HCL 5 MG PO TABS
5.0000 mg | ORAL_TABLET | Freq: Two times a day (BID) | ORAL | Status: DC
  Administered 2018-11-28 – 2018-11-30 (×4): 5 mg via ORAL
  Filled 2018-11-28 (×4): qty 1

## 2018-11-28 MED ORDER — HYDROXYZINE HCL 25 MG PO TABS: 25.0000 mg | ORAL_TABLET | Freq: Every evening | ORAL | Status: DC | PRN

## 2018-11-28 MED ORDER — NITROGLYCERIN 0.4 MG SL SUBL: 0.4000 mg | SUBLINGUAL_TABLET | SUBLINGUAL | Status: DC | PRN

## 2018-11-28 MED ORDER — ENOXAPARIN SODIUM 40 MG/0.4ML ~~LOC~~ SOLN
40.0000 mg | SUBCUTANEOUS | Status: DC
  Administered 2018-11-28: 40 mg via SUBCUTANEOUS
  Filled 2018-11-28: qty 0.4

## 2018-11-28 MED ORDER — SODIUM CHLORIDE 0.9% FLUSH
3.0000 mL | Freq: Two times a day (BID) | INTRAVENOUS | Status: DC
  Administered 2018-11-28 – 2018-11-29 (×2): 3 mL via INTRAVENOUS

## 2018-11-28 MED ORDER — ONDANSETRON HCL 4 MG PO TABS: 4.0000 mg | ORAL_TABLET | Freq: Four times a day (QID) | ORAL | Status: DC | PRN

## 2018-11-28 MED ORDER — DICLOFENAC SODIUM 75 MG PO TBEC
75.0000 mg | DELAYED_RELEASE_TABLET | Freq: Two times a day (BID) | ORAL | Status: DC
  Administered 2018-11-28: 75 mg via ORAL
  Filled 2018-11-28 (×2): qty 1

## 2018-11-28 MED ORDER — FLUDROCORTISONE ACETATE 0.1 MG PO TABS
0.1000 mg | ORAL_TABLET | Freq: Every day | ORAL | Status: DC
  Administered 2018-11-28 – 2018-12-01 (×3): 0.1 mg via ORAL
  Filled 2018-11-28 (×5): qty 1

## 2018-11-28 MED ORDER — FUROSEMIDE 20 MG PO TABS
20.0000 mg | ORAL_TABLET | Freq: Every day | ORAL | Status: DC
  Administered 2018-11-28: 20 mg via ORAL
  Filled 2018-11-28: qty 1

## 2018-11-28 MED ORDER — TERIPARATIDE (RECOMBINANT) 600 MCG/2.4ML ~~LOC~~ SOLN: 20.0000 ug | Freq: Every day | SUBCUTANEOUS | Status: DC

## 2018-11-28 MED ORDER — ACETAMINOPHEN 650 MG RE SUPP: 650.0000 mg | Freq: Four times a day (QID) | RECTAL | Status: DC | PRN

## 2018-11-28 MED ORDER — CLOPIDOGREL BISULFATE 75 MG PO TABS
75.0000 mg | ORAL_TABLET | Freq: Every day | ORAL | Status: DC
  Administered 2018-11-28: 75 mg via ORAL
  Filled 2018-11-28: qty 1

## 2018-11-28 MED ORDER — ONDANSETRON HCL 4 MG/2ML IJ SOLN
4.0000 mg | Freq: Four times a day (QID) | INTRAMUSCULAR | Status: DC | PRN
  Administered 2018-11-30: 4 mg via INTRAVENOUS
  Filled 2018-11-28: qty 2

## 2018-11-28 MED ORDER — FERROUS SULFATE 325 (65 FE) MG PO TABS
325.0000 mg | ORAL_TABLET | Freq: Every day | ORAL | Status: DC
  Administered 2018-11-28 – 2018-12-01 (×3): 325 mg via ORAL
  Filled 2018-11-28 (×3): qty 1

## 2018-11-28 MED ORDER — SODIUM CHLORIDE 0.9 % IV SOLN
INTRAVENOUS | Status: AC
  Administered 2018-11-28: 18:00:00 via INTRAVENOUS

## 2018-11-28 MED ORDER — ALENDRONATE SODIUM 70 MG PO TABS: 70.0000 mg | ORAL_TABLET | ORAL | Status: DC

## 2018-11-28 MED ORDER — VITAMIN B-12 1000 MCG PO TABS
1000.0000 ug | ORAL_TABLET | Freq: Every day | ORAL | Status: DC
  Administered 2018-11-28 – 2018-12-01 (×3): 1000 ug via ORAL
  Filled 2018-11-28 (×3): qty 1

## 2018-11-28 MED ORDER — FLUOXETINE HCL 20 MG PO CAPS
20.0000 mg | ORAL_CAPSULE | Freq: Every day | ORAL | Status: DC
  Filled 2018-11-28: qty 1

## 2018-11-28 MED ORDER — SODIUM CHLORIDE 0.9 % IV SOLN
250.0000 mL | INTRAVENOUS | Status: DC | PRN
  Administered 2018-11-30: 1000 mL via INTRAVENOUS

## 2018-11-28 MED ORDER — ZOLPIDEM TARTRATE 5 MG PO TABS: 5.0000 mg | ORAL_TABLET | Freq: Every evening | ORAL | Status: DC | PRN

## 2018-11-28 MED ORDER — ASPIRIN EC 81 MG PO TBEC
81.0000 mg | DELAYED_RELEASE_TABLET | Freq: Every day | ORAL | Status: DC
  Administered 2018-11-28: 81 mg via ORAL
  Filled 2018-11-28: qty 1

## 2018-11-28 MED ORDER — LEVOTHYROXINE SODIUM 25 MCG PO TABS
25.0000 ug | ORAL_TABLET | Freq: Every day | ORAL | Status: DC
  Administered 2018-11-28 – 2018-11-29 (×2): 25 ug via ORAL
  Filled 2018-11-28 (×2): qty 1

## 2018-11-28 MED ORDER — FLUOXETINE HCL 20 MG PO CAPS
60.0000 mg | ORAL_CAPSULE | Freq: Every morning | ORAL | Status: DC
  Administered 2018-11-28 – 2018-12-01 (×3): 60 mg via ORAL
  Filled 2018-11-28 (×4): qty 3

## 2018-11-28 MED ORDER — IOHEXOL 350 MG/ML SOLN
60.0000 mL | Freq: Once | INTRAVENOUS | Status: AC | PRN
  Administered 2018-11-28: 60 mL via INTRAVENOUS

## 2018-11-28 NOTE — ED Notes (Signed)
Pt notified of being NPO at this time.

## 2018-11-28 NOTE — ED Triage Notes (Signed)
CP to center of chest that awoke him from sleep last night. Endorsing SOB on exertion. Pt alert and oriented X4, cooperative, RR even and unlabored, color WNL. Pt in NAD.

## 2018-11-28 NOTE — ED Notes (Signed)
ED TO INPATIENT HANDOFF REPORT  ED Nurse Name and Phone #: Janett Billow [phone]  S Name/Age/Gender Shawn Lam 68 y.o. male Room/Bed: ED26A/ED26A  Code Status   Code Status: Prior  Home/SNF/Other Home Patient oriented to: self, place, time and situation Is this baseline? Yes   Triage Complete: Triage complete  Chief Complaint cp  Triage Note CP to center of chest that awoke him from sleep last night. Endorsing SOB on exertion. Pt alert and oriented X4, cooperative, RR even and unlabored, color WNL. Pt in NAD.    Allergies Allergies  Allergen Reactions  . Codeine Nausea Only  . Tape Other (See Comments)    Use only paper tape. Severe blistering and bruising with other tapes. No cloth tape.    Level of Care/Admitting Diagnosis ED Disposition    ED Disposition Condition [facility] Area: [place] [100120]  Level of Care: Telemetry [5]  Covid Evaluation: Asymptomatic Screening Protocol (No Symptoms)  Diagnosis: Chest pain [638466]  Admitting Physician: Dustin Flock [[id number]]  Attending Physician: Dustin Flock [[id number]]  PT Class (Do Not Modify): Observation [104]  PT Acc Code (Do Not Modify): Observation [10022]       B Medical/Surgery History Past Medical History:  Diagnosis Date  . Anemia   . Anxiety   . Asymptomatic LV dysfunction    50-55% by echo 06/2016  . Berger's disease   . CAD (coronary artery disease) 06/2005   PCI of LAD and RCA  . Depression   . Dilated aortic root (HCC)    4cm at sinus of Valsalva  . Hematuria   . Hyperlipidemia   . Hypothyroidism   . Insomnia   . Left ureteral stone   . Orthostatic hypotension    negative tilt table  . OSA (obstructive sleep apnea)    moderate with AHI 17/hr, uses CPAP nightly  . PVC's (premature ventricular contractions) 07/07/2016  . Renal disorder    PT IS SEEING DR. Lorrene Reid- NEPHROLOGIST  . Rosacea   . Urticaria    Past Surgical History:  Procedure  Laterality Date  . CARDIAC CATHETERIZATION  09/06/2013   [place]  . CARDIAC CATHETERIZATION    . CORONARY ANGIOPLASTY  2004   STENT PLACEMENT  . CYSTOSCOPY WITH RETROGRADE PYELOGRAM, URETEROSCOPY AND STENT PLACEMENT Left 03/19/2014   Procedure: CYSTOSCOPY WITH RETROGRADE PYELOGRAM, URETEROSCOPY AND STENT PLACEMENT;  Surgeon: Junious Dresser, MD;  Location: [facility];  Service: Urology;  Laterality: Left;  . CYSTOSCOPY WITH RETROGRADE PYELOGRAM, URETEROSCOPY AND STENT PLACEMENT Bilateral 05/20/2015   Procedure:  CYSTOSCOPY WITH BILATERAL RETROGRADE PYELOGRAM,RIGHT  DIAGNOSTIC URETEROSCOPY ,LEFT URETEROSCOPY WITH HOLMIUM LASER  AND BILATERAL  STENT PLACEMENT ;  Surgeon: Alexis Frock, MD;  Location: [facility];  Service: Urology;  Laterality: Bilateral;  . DRUG INDUCED ENDOSCOPY N/A 10/20/2017   Procedure: DRUG INDUCED SLEEP ENDOSCOPY;  Surgeon: Melida Quitter, MD;  Location: [facility];  Service: ENT;  Laterality: N/A;  . EP IMPLANTABLE DEVICE N/A 05/13/2016   Procedure: Loop Recorder Insertion;  Surgeon: Deboraha Sprang, MD;  Location: [facility];  Service: Cardiovascular;  Laterality: N/A;  . HOLMIUM LASER APPLICATION Bilateral 9/0/3009   Procedure: HOLMIUM LASER APPLICATION;  Surgeon: Alexis Frock, MD;  Location: [facility];  Service: Urology;  Laterality: Bilateral;  . IMPLIMENTATION OF A HYPOGLOSSAL NERVE STIMULATOR  12/08/2017   [facility]   . LEFT HEART CATHETERIZATION WITH CORONARY ANGIOGRAM N/A 09/06/2013   Procedure: LEFT HEART CATHETERIZATION WITH CORONARY ANGIOGRAM;  Surgeon: Sinclair Grooms, MD;  Location: [facility];  Service: Cardiovascular;  Laterality: N/A;  . LEFT KNEE CAP  SURGERY ABOUT 40 YRS AGO  ? 1970    . STONE EXTRACTION WITH BASKET Left 03/19/2014   Procedure: STONE EXTRACTION WITH BASKET;  Surgeon: Junious Dresser, MD;  Location: [facility];  Service: Urology;  Laterality: Left;     A IV Location/Drains/Wounds Patient Lines/Drains/Airways Status   Active  Line/Drains/Airways    Name:   Placement date:   Placement time:   Site:   Days:   Peripheral IV 11/28/18 Left Antecubital   11/28/18    0902    Antecubital   less than 1   Ureteral Drain/Stent Left ureter 5 Fr.   03/19/14    1501    Left ureter   1715   Ureteral Drain/Stent Left ureter 5 Fr.   05/20/15    0915    Left ureter   1288   Ureteral Drain/Stent Right ureter 5 Fr.   05/20/15    0917    Right ureter   1288   Incision (Closed) 10/20/17 Throat Other (Comment)   10/20/17    1035     404   Incision (Closed) 12/08/17 Abdomen Right   12/08/17    0819     355   Incision (Closed) 12/08/17 Chest Right   12/08/17    0819     355   Incision (Closed) 12/08/17 Neck Right   12/08/17    0819     355          Intake/Output Last 24 hours No intake or output data in the 24 hours ending 11/28/18 1355  Labs/Imaging Results for orders placed or performed during the hospital encounter of 11/28/18 (from the past 48 hour(s))  Basic metabolic panel     Status: Abnormal   Collection Time: 11/28/18  9:03 AM  Result Value Ref Range   Sodium 135 135 - 145 mmol/L   Potassium 4.9 3.5 - 5.1 mmol/L   Chloride 105 98 - 111 mmol/L   CO2 21 (L) 22 - 32 mmol/L   Glucose, Bld 155 (H) 70 - 99 mg/dL   BUN 62 (H) 8 - 23 mg/dL   Creatinine, Ser 1.55 (H) 0.61 - 1.24 mg/dL   Calcium 9.2 8.9 - 10.3 mg/dL   GFR calc non Af Amer 45 (L) >60 mL/min   GFR calc Af Amer 53 (L) >60 mL/min   Anion gap 9 5 - 15    Comment: Performed at [facility], [address] [place], [place] [address]  CBC     Status: Abnormal   Collection Time: 11/28/18  9:03 AM  Result Value Ref Range   WBC 8.2 4.0 - 10.5 K/uL   RBC 3.44 (L) 4.22 - 5.81 MIL/uL   Hemoglobin 11.2 (L) 13.0 - 17.0 g/dL   HCT 34.7 (L) 39.0 - 52.0 %   MCV 100.9 (H) 80.0 - 100.0 fL   MCH 32.6 26.0 - 34.0 pg   MCHC 32.3 30.0 - 36.0 g/dL   RDW 14.1 11.5 - 15.5 %   Platelets 252 150 - 400 K/uL   nRBC 0.0 0.0 - 0.2 %    Comment: Performed at [facility], [address] [place], [place] [address]  Troponin I (High Sensitivity)     Status: None   Collection Time: 11/28/18  9:03 AM  Result Value Ref Range   Troponin I (High  Sensitivity) 5 <18 ng/L    Comment: (NOTE) Elevated high sensitivity troponin I (hsTnI) values and significant  changes across serial measurements may suggest ACS but many other  chronic and acute conditions are known to elevate hsTnI results.  Refer to the "Links" section for chest pain algorithms and additional  guidance. Performed at [facility], [address] [place], [place] [address]   Fibrin derivatives D-Dimer [facility] only)     Status: Abnormal   Collection Time: 11/28/18  9:03 AM  Result Value Ref Range   Fibrin derivatives D-dimer ([facility]) 1,047.60 (H) 0.00 - 499.00 ng/mL (FEU)    Comment: (NOTE) <> Exclusion of Venous Thromboembolism (VTE) - OUTPATIENT ONLY   (Emergency Department or [place])   0-499 ng/ml (FEU): With a low to intermediate pretest probability                      for VTE this test result excludes the diagnosis                      of VTE.   >499 ng/ml (FEU) : VTE not excluded; additional work up for VTE is                      required. <> Testing on Inpatients and Evaluation of Disseminated Intravascular   Coagulation (DIC) Reference Range:   0-499 ng/ml (FEU) Performed at [facility], [place], [place] [address]   Troponin I (High Sensitivity)     Status: None   Collection Time: 11/28/18 10:35 AM  Result Value Ref Range   Troponin I (High Sensitivity) 6 <18 ng/L    Comment: (NOTE) Elevated high sensitivity troponin I (hsTnI) values and significant  changes across serial measurements may suggest ACS but many other  chronic and acute conditions are known to elevate hsTnI results.  Refer to the "Links" section for chest pain algorithms and additional  guidance. Performed at [facility], [place],  [address]    Ct Angio Chest Pe W And/or Wo Contrast  Result Date: 11/28/2018 CLINICAL DATA:  Chest pressure, shortness of breath EXAM: CT ANGIOGRAPHY CHEST WITH CONTRAST TECHNIQUE: Multidetector CT imaging of the chest was performed using the standard protocol during bolus administration of intravenous contrast. Multiplanar CT image reconstructions and MIPs were obtained to evaluate the vascular anatomy. CONTRAST:  28mL OMNIPAQUE IOHEXOL 350 MG/ML SOLN COMPARISON:  06/20/2013, 02/09/2018 FINDINGS: Cardiovascular: Satisfactory opacification of the pulmonary arteries to the segmental level. No evidence of pulmonary embolism. Normal heart size. No pericardial effusion. Mediastinum/Nodes: No enlarged mediastinal, hilar, or axillary lymph nodes. Thyroid gland, trachea, and esophagus demonstrate no significant findings. Lungs/Pleura: Chronic posterior biapical pleuroparenchymal scarring. Lungs are otherwise clear. No focal airspace consolidation, pleural effusion, or pneumothorax. Upper Abdomen: No acute abnormality. Musculoskeletal: Right chest wall stimulator. Left chest wall loop recorder. Chronic compression deformity T12 status post cement augmentation. Additional chronic mild superior endplate compression deformities of T7, T8, and T9, which were apparent on prior x-ray 02/09/2018. No new or acute osseous abnormality identified. Review of the MIP images confirms the above findings. IMPRESSION: 1. Negative for PE.  Lungs are clear. 2. Multiple chronic thoracic spine vertebral body compression deformities, not significantly changed in appearance compared to 02/09/2018. Electronically Signed   By: Davina Poke M.D.   On: 11/28/2018 10:28   Dg Chest Port 1 View  Result Date: 11/28/2018 CLINICAL DATA:  Chest pain.  EXAM: PORTABLE CHEST 1 VIEW COMPARISON:  Radiographs of December 08, 2017. FINDINGS: The heart size and mediastinal contours are within normal limits. Both lungs are clear. No  pneumothorax or pleural effusion is noted. Stable position of stimulator seen over right chest. The visualized skeletal structures are unremarkable. IMPRESSION: No active disease. Electronically Signed   By: Marijo Conception M.D.   On: 11/28/2018 08:35    Pending Labs Unresulted Labs (From admission, onward)    Start     Ordered   11/28/18 1307  SARS CORONAVIRUS 2 Nasal Swab Aptima Multi Swab  (Asymptomatic/Tier 2 Patients Labs)  Once,   STAT    Question Answer Comment  Is this test for diagnosis or screening Screening   Symptomatic for COVID-19 as defined by CDC No   Hospitalized for COVID-19 No   Admitted to ICU for COVID-19 No   Previously tested for COVID-19 No   Resident in a congregate (group) care setting No   Employed in healthcare setting No      11/28/18 1306   Signed and Held  CBC  (enoxaparin (LOVENOX)    CrCl >/= 30 ml/min)  Once,   R    Comments: Baseline for enoxaparin therapy IF NOT ALREADY DRAWN.  Notify MD if PLT < 100 K.    Signed and Held   Signed and Held  Creatinine, serum  (enoxaparin (LOVENOX)    CrCl >/= 30 ml/min)  Once,   R    Comments: Baseline for enoxaparin therapy IF NOT ALREADY DRAWN.    Signed and Held   Signed and Held  Creatinine, serum  (enoxaparin (LOVENOX)    CrCl >/= 30 ml/min)  Weekly,   R    Comments: while on enoxaparin therapy    Signed and Held   Signed and Held  CBC  Tomorrow morning,   R     Signed and Held   Signed and Held  Basic metabolic panel  Tomorrow morning,   R     Signed and Held          Vitals/Pain Today's Vitals   11/28/18 1100 11/28/18 1130 11/28/18 1200 11/28/18 1230  BP: (!) 141/73 133/78 125/80 136/84  Pulse: 75 82 86 79  Resp: (!) 21 (!) 22 18 18   Temp:      TempSrc:      SpO2: 100% 100% 100% 100%  Weight:      Height:      PainSc:        Isolation Precautions No active isolations  Medications Medications  sodium chloride 0.9 % bolus 500 mL (500 mLs Intravenous Bolus 11/28/18 1027)  iohexol  (OMNIPAQUE) 350 MG/ML injection 60 mL (60 mLs Intravenous Contrast Given 11/28/18 1006)    Mobility walks Low fall risk   Focused Assessments Cardiac Assessment Handoff:  Cardiac Rhythm: Normal sinus rhythm Lab Results  Component Value Date   CKTOTAL 101 06/03/2016   DDIMER: 1,047 Trop: 6 Does the Patient currently have chest pain? No     R Recommendations: See Admitting Provider Note  Report given to:   Additional Notes: placing in obs and stress test tomorrow

## 2018-11-28 NOTE — ED Notes (Signed)
Pt test is tomorrow and pt notified of being able to eat

## 2018-11-28 NOTE — H&P (Addendum)
[facility] NAME: Shawn Lam    MR#:  [id number]  DATE OF BIRTH:  1950/06/26  DATE OF ADMISSION:  11/28/2018  PRIMARY CARE PHYSICIAN: Jinny Sanders, MD   REQUESTING/REFERRING PHYSICIAN: Earleen Newport, MD  CHIEF COMPLAINT:   Chief Complaint  Patient presents with  . Chest Pain    HISTORY OF PRESENT ILLNESS: Shawn Lam  is a 68 y.o. male with a known history of multiple medical problems including coronary artery disease with previous stent, Burger's disease, orthostatic hypotension, hypothyroidism, and obstructive sleep apnea who is presenting with chest pain.  Patient states that yesterday night he started having chest pain in middle of his chest.  Pressure-like sensation.  Occurred with activity when he went to the bathroom as well.  Now is resolved.  He was seen by cardiology who recommended patient be placed on observation and have a stress test in the morning. PAST MEDICAL HISTORY:   Past Medical History:  Diagnosis Date  . Anemia   . Anxiety   . Asymptomatic LV dysfunction    50-55% by echo 06/2016  . Berger's disease   . CAD (coronary artery disease) 06/2005   PCI of LAD and RCA  . Depression   . Dilated aortic root (HCC)    4cm at sinus of Valsalva  . Hematuria   . Hyperlipidemia   . Hypothyroidism   . Insomnia   . Left ureteral stone   . Orthostatic hypotension    negative tilt table  . OSA (obstructive sleep apnea)    moderate with AHI 17/hr, uses CPAP nightly  . PVC's (premature ventricular contractions) 07/07/2016  . Renal disorder    PT IS SEEING DR. Lorrene Reid- NEPHROLOGIST  . Rosacea   . Urticaria     PAST SURGICAL HISTORY:  Past Surgical History:  Procedure Laterality Date  . CARDIAC CATHETERIZATION  09/06/2013   [place]  . CARDIAC CATHETERIZATION    . CORONARY ANGIOPLASTY  2004   STENT PLACEMENT  . CYSTOSCOPY WITH RETROGRADE PYELOGRAM, URETEROSCOPY AND STENT PLACEMENT Left 03/19/2014    Procedure: CYSTOSCOPY WITH RETROGRADE PYELOGRAM, URETEROSCOPY AND STENT PLACEMENT;  Surgeon: Junious Dresser, MD;  [facility];  Service: Urology;  Laterality: Left;  . CYSTOSCOPY WITH RETROGRADE PYELOGRAM, URETEROSCOPY AND STENT PLACEMENT Bilateral 05/20/2015   Procedure:  CYSTOSCOPY WITH BILATERAL RETROGRADE PYELOGRAM,RIGHT  DIAGNOSTIC URETEROSCOPY ,LEFT URETEROSCOPY WITH HOLMIUM LASER  AND BILATERAL  STENT PLACEMENT ;  Surgeon: Alexis Frock, MD;  [facility];  Service: Urology;  Laterality: Bilateral;  . DRUG INDUCED ENDOSCOPY N/A 10/20/2017   Procedure: DRUG INDUCED SLEEP ENDOSCOPY;  Surgeon: Melida Quitter, MD;  Location: [place];  Service: ENT;  Laterality: N/A;  . EP IMPLANTABLE DEVICE N/A 05/13/2016   Procedure: Loop Recorder Insertion;  Surgeon: Deboraha Sprang, MD;  Location: [place] [facility];  Service: Cardiovascular;  Laterality: N/A;  . HOLMIUM LASER APPLICATION Bilateral 05/12/3084   Procedure: HOLMIUM LASER APPLICATION;  Surgeon: Alexis Frock, MD;  [facility];  Service: Urology;  Laterality: Bilateral;  . IMPLIMENTATION OF A HYPOGLOSSAL NERVE STIMULATOR  12/08/2017   [facility]   . LEFT HEART CATHETERIZATION WITH CORONARY ANGIOGRAM N/A 09/06/2013   Procedure: LEFT HEART CATHETERIZATION WITH CORONARY ANGIOGRAM;  Surgeon: Sinclair Grooms, MD;  Location: [facility] C[facility] LAB;  Service: Cardiovascular;  Laterality: N/A;  . LEFT KNEE CAP  SURGERY ABOUT 40 YRS AGO  ? 1970    . STONE EXTRACTION  WITH BASKET Left 03/19/2014   Procedure: STONE EXTRACTION WITH BASKET;  Surgeon: Junious Dresser, MD;  [facility];  Service: Urology;  Laterality: Left;    SOCIAL HISTORY:  Social History   Tobacco Use  . Smoking status: Never Smoker  . Smokeless tobacco: Never Used  Substance Use Topics  . Alcohol use: Yes    Alcohol/week: 1.0 standard drinks    Types: 1 Cans of beer per week    Comment: Occasional beer    FAMILY HISTORY:  Family History  Problem Relation  Age of Onset  . Coronary artery disease Mother   . Heart attack Mother   . Heart disease Mother   . Hypertension Mother   . Cancer Father        lung and colon  . Lung cancer Father        smoker  . Diabetes Brother     DRUG ALLERGIES:  Allergies  Allergen Reactions  . Codeine Nausea Only  . Tape Other (See Comments)    Use only paper tape. Severe blistering and bruising with other tapes. No cloth tape.    REVIEW OF SYSTEMS:   CONSTITUTIONAL: No fever, fatigue or weakness.  EYES: No blurred or double vision.  EARS, NOSE, AND THROAT: No tinnitus or ear pain.  RESPIRATORY: No cough, shortness of breath, wheezing or hemoptysis.  CARDIOVASCULAR: Positive chest pain, orthopnea, edema.  GASTROINTESTINAL: No nausea, vomiting, diarrhea or abdominal pain.  GENITOURINARY: No dysuria, hematuria.  ENDOCRINE: No polyuria, nocturia,  HEMATOLOGY: No anemia, easy bruising or bleeding SKIN: No rash or lesion. MUSCULOSKELETAL: No joint pain or arthritis.   NEUROLOGIC: No tingling, numbness, weakness.  PSYCHIATRY: No anxiety or depression.   MEDICATIONS AT HOME:  Prior to Admission medications   Medication Sig Start Date End Date Taking? Authorizing Provider  alendronate (FOSAMAX) 70 MG tablet Take 1 tablet (70 mg total) by mouth every 7 (seven) days. Take with a full glass of water on an empty stomach. 02/22/18   Jinny Sanders, MD  aspirin EC 81 MG tablet Take 81 mg by mouth at bedtime.     [provider]  atorvastatin (LIPITOR) 80 MG tablet Take 1 tablet (80 mg total) by mouth daily. 02/22/18   Bedsole, Amy E, MD  clopidogrel (PLAVIX) 75 MG tablet TAKE 1 TABLET DAILY 11/07/18   Sueanne Margarita, MD  cyclobenzaprine (FLEXERIL) 10 MG tablet TAKE 1/2-1 TAB BY MOUTH AT BEDTIME AS NEEDED FOR MUSCLE SPASMS 11/01/18   Bedsole, Amy E, MD  diclofenac (VOLTAREN) 75 MG EC tablet TAKE 1 TABLET BY MOUTH TWICE A DAY 11/23/18   Bedsole, Amy E, MD  diclofenac sodium (VOLTAREN) 1 % GEL Apply 4 g  topically 4 (four) times daily. 11/09/18   Bedsole, Amy E, MD  ferrous sulfate 325 (65 FE) MG tablet TAKE 1 TABLET BY MOUTH 3 TIMES DAILY WITH MEALS. 02/20/18   Bedsole, Amy E, MD  fludrocortisone (FLORINEF) 0.1 MG tablet Take 1 tablet (0.1 mg total) by mouth daily. 07/19/16   Imogene Burn, PA-C  Fluocinolone Acetonide Body 0.01 % OIL APPLY 1 ML ON THE SKIN TWICE A DAY 02/16/18   [provider]  FLUoxetine (PROZAC) 20 MG capsule TAKE 1 CAPSULE DAILY. TAKE WITH 40MG  TO EQUAL 60MG     ONCE DAILY 07/30/18   Bedsole, Amy E, MD  FLUoxetine (PROZAC) 40 MG capsule TAKE 1 CAPSULE EVERY       MORNING 02/05/18   Jinny Sanders, MD  furosemide (LASIX) 20 MG tablet TAKE 1 TABLET DAILY 04/10/18   Imogene Burn, PA-C  hydrOXYzine (ATARAX/VISTARIL) 25 MG tablet TAKE 1 TABLET BY MOUTH AT BEDTIME MAY MAKE DROWSY 02/14/18   [provider]  levothyroxine (SYNTHROID, LEVOTHROID) 25 MCG tablet Take 1 tablet (25 mcg total) by mouth daily before breakfast. 03/14/18   Bedsole, Amy E, MD  midodrine (PROAMATINE) 5 MG tablet TAKE 1 TABLET (5 MG TOTAL) BY MOUTH 2 (TWO) TIMES DAILY WITH BREAKFAST AND LUNCH. 03/14/18   Turner, Eber Hong, MD  mirtazapine (REMERON) 15 MG tablet TAKE 1 TABLET BY MOUTH EVERYDAY AT BEDTIME 10/15/18   Bedsole, Amy E, MD  nitroGLYCERIN (NITROSTAT) 0.4 MG SL tablet Place 1 tablet (0.4 mg total) under the tongue every 5 (five) minutes as needed for chest pain. 03/30/18   Elby Beck, FNP  Teriparatide, Recombinant, (FORTEO) 600 MCG/2.4ML SOLN Inject 20 mcg daily into the skin.  12/28/16   [provider]  vitamin B-12 (CYANOCOBALAMIN) 1000 MCG tablet Take 1,000 mcg by mouth daily.     [provider]  zolpidem (AMBIEN) 5 MG tablet TAKE 1 TABLET BY MOUTH AT BEDTIME AS NEEDED FOR SLEEP 09/09/18   [provider]      PHYSICAL EXAMINATION:   VITAL SIGNS: Blood pressure 136/84, pulse 79, temperature 97.7 F (36.5 C), temperature source Oral, resp. rate 18,  height 6' (1.829 m), weight 81 kg, SpO2 100 %.  GENERAL:  68 y.o.-year-old patient lying in the bed with no acute distress.  EYES: Pupils equal, round, reactive to light and accommodation. No scleral icterus. Extraocular muscles intact.  HEENT: Head atraumatic, normocephalic. Oropharynx and nasopharynx clear.  NECK:  Supple, no jugular venous distention. No thyroid enlargement, no tenderness.  LUNGS: Normal breath sounds bilaterally, no wheezing, rales,rhonchi or crepitation. No use of accessory muscles of respiration.  CARDIOVASCULAR: S1, S2 normal. No murmurs, rubs, or gallops.  ABDOMEN: Soft, nontender, nondistended. Bowel sounds present. No organomegaly or mass.  EXTREMITIES: No pedal edema, cyanosis, or clubbing.  NEUROLOGIC: Cranial nerves II through XII are intact. Muscle strength 5/5 in all extremities. Sensation intact. Gait not checked.  PSYCHIATRIC: The patient is alert and oriented x 3.  SKIN: No obvious rash, lesion, or ulcer.   LABORATORY PANEL:   CBC Recent Labs  Lab 11/28/18 0903  WBC 8.2  HGB 11.2*  HCT 34.7*  PLT 252  MCV 100.9*  MCH 32.6  MCHC 32.3  RDW 14.1   ------------------------------------------------------------------------------------------------------------------  Chemistries  Recent Labs  Lab 11/28/18 0903  NA 135  K 4.9  CL 105  CO2 21*  GLUCOSE 155*  BUN 62*  CREATININE 1.55*  CALCIUM 9.2   ------------------------------------------------------------------------------------------------------------------ estimated creatinine clearance is 50.1 mL/min (A) (by C-G formula based on SCr of 1.55 mg/dL (H)). ------------------------------------------------------------------------------------------------------------------ No results for input(s): TSH, T4TOTAL, T3FREE, THYROIDAB in the last 72 hours.  Invalid input(s): FREET3   Coagulation profile No results for input(s): INR, PROTIME in the last 168  hours. ------------------------------------------------------------------------------------------------------------------- No results for input(s): DDIMER in the last 72 hours. -------------------------------------------------------------------------------------------------------------------  Cardiac Enzymes No results for input(s): CKMB, TROPONINI, MYOGLOBIN in the last 168 hours.  Invalid input(s): CK ------------------------------------------------------------------------------------------------------------------ Invalid input(s): POCBNP  ---------------------------------------------------------------------------------------------------------------  Urinalysis    Component Value Date/Time   COLORURINE YELLOW (A) 05/15/2015 0257   APPEARANCEUR HAZY (A) 05/15/2015 0257   LABSPEC 1.014 05/15/2015 0257   PHURINE 5.0 05/15/2015 0257   GLUCOSEU NEGATIVE 05/15/2015 0257   GLUCOSEU NEGATIVE 01/31/2013 1254   HGBUR 3+ (A)  05/15/2015 0257   HGBUR large 01/21/2010 1102   BILIRUBINUR negative 06/07/2016 1048   KETONESUR NEGATIVE 05/15/2015 0257   PROTEINUR negative 06/07/2016 1048   PROTEINUR NEGATIVE 05/15/2015 0257   UROBILINOGEN 0.2 06/07/2016 1048   UROBILINOGEN 0.2 03/16/2014 1426   NITRITE negative 06/07/2016 1048   NITRITE NEGATIVE 05/15/2015 0257   LEUKOCYTESUR Negative 06/07/2016 1048     RADIOLOGY: Ct Angio Chest Pe W And/or Wo Contrast  Result Date: 11/28/2018 CLINICAL DATA:  Chest pressure, shortness of breath EXAM: CT ANGIOGRAPHY CHEST WITH CONTRAST TECHNIQUE: Multidetector CT imaging of the chest was performed using the standard protocol during bolus administration of intravenous contrast. Multiplanar CT image reconstructions and MIPs were obtained to evaluate the vascular anatomy. CONTRAST:  67mL OMNIPAQUE IOHEXOL 350 MG/ML SOLN COMPARISON:  06/20/2013, 02/09/2018 FINDINGS: Cardiovascular: Satisfactory opacification of the pulmonary arteries to the segmental level. No  evidence of pulmonary embolism. Normal heart size. No pericardial effusion. Mediastinum/Nodes: No enlarged mediastinal, hilar, or axillary lymph nodes. Thyroid gland, trachea, and esophagus demonstrate no significant findings. Lungs/Pleura: Chronic posterior biapical pleuroparenchymal scarring. Lungs are otherwise clear. No focal airspace consolidation, pleural effusion, or pneumothorax. Upper Abdomen: No acute abnormality. Musculoskeletal: Right chest wall stimulator. Left chest wall loop recorder. Chronic compression deformity T12 status post cement augmentation. Additional chronic mild superior endplate compression deformities of T7, T8, and T9, which were apparent on prior x-ray 02/09/2018. No new or acute osseous abnormality identified. Review of the MIP images confirms the above findings. IMPRESSION: 1. Negative for PE.  Lungs are clear. 2. Multiple chronic thoracic spine vertebral body compression deformities, not significantly changed in appearance compared to 02/09/2018. Electronically Signed   By: Davina Poke M.D.   On: 11/28/2018 10:28   Dg Chest Port 1 View  Result Date: 11/28/2018 CLINICAL DATA:  Chest pain. EXAM: PORTABLE CHEST 1 VIEW COMPARISON:  Radiographs of December 08, 2017. FINDINGS: The heart size and mediastinal contours are within normal limits. Both lungs are clear. No pneumothorax or pleural effusion is noted. Stable position of stimulator seen over right chest. The visualized skeletal structures are unremarkable. IMPRESSION: No active disease. Electronically Signed   By: Marijo Conception M.D.   On: 11/28/2018 08:35    EKG: Orders placed or performed during the hospital encounter of 11/28/18  . EKG 12-Lead  . EKG 12-Lead  . ED EKG  . ED EKG    IMPRESSION AND PLAN: Patient 68 year old presenting with chest pain  1.  Chest pain with history of coronary artery disease Continue aspirin therapy and Plavix Cardiology has seen the patient and plan for stress test in the  morning CT per PE is negative  2.  Hyperlipidemia continue Lipitor  3.  Chronic orthostatic hypotension continue midodrine  4.  Hypothyroidism continue Synthroid  5.  Depression continue Prozac  6.  Insomnia continue home medication  7.  Chronic kidney disease renal function stable  8.  Miscellaneous Lovenox for DVT prophylaxis  All the records are reviewed and case discussed with ED provider. Management plans discussed with the patient, family and they are in agreement.  CODE STATUS: Code Status History    Date Active Date Inactive Code Status Order ID Comments User Context   12/08/2017 1332 12/09/2017 1351 Full Code [id number]  Melida Quitter, MD Inpatient   09/06/2013 0834 09/06/2013 1438 Full Code [id number]  Belva Crome, MD Inpatient   Advance Care Planning Activity       TOTAL TIME TAKING CARE OF THIS PATIENT: 24minutes.  Dustin Flock M.D on 11/28/2018 at 1:05 PM  Between 7am to 6pm - Pager - [phone]  After 6pm go to [email or web address] - password [email or web address]  [employer] Office  [phone]  CC: Primary care physician; Jinny Sanders, MD

## 2018-11-28 NOTE — ED Provider Notes (Signed)
[facility] Emergency Department Provider Note       Time seen: ----------------------------------------- 8:18 AM on 11/28/2018 -----------------------------------------   I have reviewed the triage vital signs and the nursing notes.  HISTORY   Chief Complaint Chest Pain    HPI Shawn Lam is a 68 y.o. male with a history of anemia, anxiety, coronary artery disease, depression, hyperlipidemia, renal disorder who presents to the ED for chest pain in the center of his chest that woke him from sleep last night.  Patient states he has shortness of breath on exertion.  Discomfort is 5 out of 10 in his chest.  Past Medical History:  Diagnosis Date  . Anemia   . Anxiety   . Asymptomatic LV dysfunction    50-55% by echo 06/2016  . Berger's disease   . CAD (coronary artery disease) 06/2005   PCI of LAD and RCA  . Depression   . Dilated aortic root (HCC)    4cm at sinus of Valsalva  . Hematuria   . Hyperlipidemia   . Hypothyroidism   . Insomnia   . Left ureteral stone   . Orthostatic hypotension    negative tilt table  . OSA (obstructive sleep apnea)    moderate with AHI 17/hr, uses CPAP nightly  . PVC's (premature ventricular contractions) 07/07/2016  . Renal disorder    PT IS SEEING DR. Lorrene Reid- NEPHROLOGIST  . Rosacea   . Urticaria     Patient Active Problem List   Diagnosis Date Noted  . Dark stools 11/09/2018  . Bilateral chronic knee pain 11/09/2018  . Chronic pain of right thumb 02/22/2018  . Acute bilateral thoracic back pain 01/23/2018  . Osteoporosis 02/10/2017  . Hypothyroidism 11/02/2016  . Thoracic compression fracture ([place]) 09/06/2016  . PVC's (premature ventricular contractions) 07/07/2016  . Hypoalbuminemia 06/28/2016  . IgA nephropathy 06/07/2016  . Palpitation 03/15/2016  . Mild depression ([place]) 11/20/2015  . OSA (obstructive sleep apnea) 02/05/2014  . Orthostatic hypotension 02/07/2013  . Macrocytic anemia 01/31/2013   . Scotoma involving central area 01/29/2013  . Optic neuropathy 01/28/2013  . HEMATURIA UNSPECIFIED 01/21/2010  . Hyperlipidemia 07/05/2006  . Generalized anxiety disorder 07/05/2006  . CAD (coronary artery disease) 07/05/2006  . GERD 07/05/2006    Past Surgical History:  Procedure Laterality Date  . CARDIAC CATHETERIZATION  09/06/2013   [place]  . CARDIAC CATHETERIZATION    . CORONARY ANGIOPLASTY  2004   STENT PLACEMENT  . CYSTOSCOPY WITH RETROGRADE PYELOGRAM, URETEROSCOPY AND STENT PLACEMENT Left 03/19/2014   Procedure: CYSTOSCOPY WITH RETROGRADE PYELOGRAM, URETEROSCOPY AND STENT PLACEMENT;  Surgeon: Junious Dresser, MD;  [facility];  Service: Urology;  Laterality: Left;  . CYSTOSCOPY WITH RETROGRADE PYELOGRAM, URETEROSCOPY AND STENT PLACEMENT Bilateral 05/20/2015   Procedure:  CYSTOSCOPY WITH BILATERAL RETROGRADE PYELOGRAM,RIGHT  DIAGNOSTIC URETEROSCOPY ,LEFT URETEROSCOPY WITH HOLMIUM LASER  AND BILATERAL  STENT PLACEMENT ;  Surgeon: Alexis Frock, MD;  [facility];  Service: Urology;  Laterality: Bilateral;  . DRUG INDUCED ENDOSCOPY N/A 10/20/2017   Procedure: DRUG INDUCED SLEEP ENDOSCOPY;  Surgeon: Melida Quitter, MD;  Location: [place];  Service: ENT;  Laterality: N/A;  . EP IMPLANTABLE DEVICE N/A 05/13/2016   Procedure: Loop Recorder Insertion;  Surgeon: Deboraha Sprang, MD;  Location: [place] [facility];  Service: Cardiovascular;  Laterality: N/A;  . HOLMIUM LASER APPLICATION Bilateral 4/0/9735   Procedure: HOLMIUM LASER APPLICATION;  Surgeon: Alexis Frock, MD;  [facility];  Service: Urology;  Laterality: Bilateral;  . IMPLIMENTATION OF A HYPOGLOSSAL NERVE STIMULATOR  12/08/2017   [facility]   . LEFT HEART CATHETERIZATION WITH CORONARY ANGIOGRAM N/A 09/06/2013   Procedure: LEFT HEART CATHETERIZATION WITH CORONARY ANGIOGRAM;  Surgeon: Sinclair Grooms, MD;  Location[facility] C[facility];  Service: Cardiovascular;  Laterality: N/A;  . LEFT KNEE CAP  SURGERY ABOUT  40 YRS AGO  ? 1970    . STONE EXTRACTION WITH BASKET Left 03/19/2014   Procedure: STONE EXTRACTION WITH BASKET;  Surgeon: Junious Dresser, MD;  [facility];  Service: Urology;  Laterality: Left;    Allergies Codeine and Tape  Social History Social History   Tobacco Use  . Smoking status: Never Smoker  . Smokeless tobacco: Never Used  Substance Use Topics  . Alcohol use: Yes    Alcohol/week: 1.0 standard drinks    Types: 1 Cans of beer per week    Comment: Occasional beer  . Drug use: No   Review of Systems Constitutional: Negative for fever. Cardiovascular: Positive for chest pain Respiratory: Positive for shortness of breath Gastrointestinal: Negative for abdominal pain, vomiting and diarrhea. Musculoskeletal: Negative for back pain. Skin: Negative for rash. Neurological: Negative for headaches, focal weakness or numbness.  All systems negative/normal/unremarkable except as stated in the HPI  ____________________________________________   PHYSICAL EXAM:  VITAL SIGNS: ED Triage Vitals [11/28/18 0815]  Enc Vitals Group     BP 129/76     Pulse Rate 96     Resp 18     Temp 97.7 F (36.5 C)     Temp Source Oral     SpO2 100 %     Weight 178 lb 9.2 oz (81 kg)     Height 6' (1.829 m)     Head Circumference      Peak Flow      Pain Score 5     Pain Loc      Pain Edu?      Excl. in [place]?    Constitutional: Alert and oriented. Well appearing and in no distress. Eyes: Conjunctivae are normal. Normal extraocular movements. ENT      Head: Normocephalic and atraumatic.      Nose: No congestion/rhinnorhea.      Mouth/Throat: Mucous membranes are moist.      Neck: No stridor. Cardiovascular: Normal rate, regular rhythm. No murmurs, rubs, or gallops. Respiratory: Normal respiratory effort without tachypnea nor retractions. Breath sounds are clear and equal bilaterally. No wheezes/rales/rhonchi. Gastrointestinal: Soft and nontender. Normal bowel  sounds Musculoskeletal: Nontender with normal range of motion in extremities. No lower extremity tenderness nor edema. Neurologic:  Normal speech and language. No gross focal neurologic deficits are appreciated.  Skin:  Skin is warm, dry and intact. No rash noted. Psychiatric: Mood and affect are normal. Speech and behavior are normal.  ____________________________________________  EKG: Interpreted by me.  Sinus rhythm with rate of 99 bpm, normal PR interval, normal QRS, nonspecific ST segment changes  ____________________________________________  ED COURSE:  As part of my medical decision making, I reviewed the following data within the Uncertain History obtained from family if available, nursing notes, old chart and ekg, as well as notes from prior ED visits. Patient presented for chest pain, we will assess with labs and imaging as indicated at this time.   Procedures  Shawn Lam was evaluated in Emergency Department on 11/28/2018 for the symptoms described in the history of present illness. He was evaluated in the context  of the global COVID-19 pandemic, which necessitated consideration that the patient might be at risk for infection with the SARS-CoV-2 virus that causes COVID-19. Institutional protocols and algorithms that pertain to the evaluation of patients at risk for COVID-19 are in a state of rapid change based on information released by regulatory bodies including the CDC and federal and state organizations. These policies and algorithms were followed during the patient's care in the ED.  ____________________________________________   LABS (pertinent positives/negatives)  Labs Reviewed  BASIC METABOLIC PANEL - Abnormal; Notable for the following components:      Result Value   CO2 21 (*)    Glucose, Bld 155 (*)    BUN 62 (*)    Creatinine, Ser 1.55 (*)    GFR calc non Af Amer 45 (*)    GFR calc Af Amer 53 (*)    All other components within normal  limits  CBC - Abnormal; Notable for the following components:   RBC 3.44 (*)    Hemoglobin 11.2 (*)    HCT 34.7 (*)    MCV 100.9 (*)    All other components within normal limits  FIBRIN DERIVATIVES D-DIMER ([facility] ONLY) - Abnormal; Notable for the following components:   Fibrin derivatives D-dimer ([facility]) 1,047.60 (*)    All other components within normal limits  TROPONIN I (HIGH SENSITIVITY)  TROPONIN I (HIGH SENSITIVITY)    RADIOLOGY Images were viewed by me  Chest x-ray IMPRESSION: No active disease. IMPRESSION: 1. Negative for PE.  Lungs are clear. 2. Multiple chronic thoracic spine vertebral body compression deformities, not significantly changed in appearance compared to 02/09/2018.  ____________________________________________   DIFFERENTIAL DIAGNOSIS   Unstable angina, MI, PE, pneumothorax, musculoskeletal pain, anxiety  FINAL ASSESSMENT AND PLAN  Chest pain   Plan: The patient had presented for central chest pain. Patient's labs were unremarkable with the exception of elevated d-dimer. Patient's imaging was negative for PE, lungs were clear.  He does have some old compression fractures in his thoracic spine.  Repeat troponin was also negative.  Patient was evaluated by cardiology and the decision was made to admit him for full rule out and stress testing.   Laurence Aly, MD    Note: This note was generated in part or whole with voice recognition software. Voice recognition is usually quite accurate but there are transcription errors that can and very often do occur. I apologize for any typographical errors that were not detected and corrected.     Earleen Newport, MD 11/28/18 1259

## 2018-11-28 NOTE — ED Notes (Signed)
Admitting MD at bedside.

## 2018-11-28 NOTE — Progress Notes (Signed)

## 2018-11-28 NOTE — Progress Notes (Signed)
Pt arrived from the ED at 1430. Pt was A&Ox4. VSS. Pt oriented to room and bedside equipment. Orders reviewed, acknowledged, and initiated. Care plan and education initiated. Pt arrived with clothes, glasses, and a watch. Pt asked not to get OOB without assistance. Pt verbalized understanding. Pts skin is dry and intact. Fall risk bracelet applied. Pt had no c/o pain. Bed in lowest position, bed alarm is on, and the call bell is within reach.

## 2018-11-28 NOTE — Consult Note (Signed)
Cardiology Consultation:   Patient ID: Shawn Lam MRN: [id number]; DOB: [birth year]  Admit date: 11/28/2018 Date of Consult: 11/28/2018  Primary Care Provider: Jinny Sanders, MD Primary Cardiologist: Fransico Him, MD  Primary Electrophysiologist:  Dr. Caryl Comes   Patient Profile:   Shawn Lam is a 68 y.o. male with a hx of CAD s/p PCI to LAD and RCA (2007), 2015 cath showing pLAD stent with distal margin 30% narrowing due to ISR, 2019 echo with mildly dilated aortic root, orthostatic hypertension and palpitations s/p Medtronic LINQ Loop insertion (Serial # [id number]) 05/13/2016, HLD, macrocytic anemia with past suspicion for hematuria, recent melena FOBT negative, h/o epistaxis, hypothyroidism, Berger's dz with known CKD, history of lymphedema (lymphedema clinic in [place]), and moderate OSA s/p inserted inspire device 11/2017 due to CPAP intolerance, and who is being seen today for the evaluation of CP at the request of Dr. Jimmye Norman.  History of Present Illness:   Shawn Lam is a 68 year old male with PMH as above and followed by Dr. Radford Pax of [place].   He has known history of CAD s/p PCI to LAD and RCA in 2007. He underwent 2015 stress test due to CP and weakness with exertion that was ruled low risk with subsequent cardiac cath performed for continued CP. Cath showed mRCA stent was without evidence of ISR; however, proximal LAD stent with distal margin 30% narrowing due to ISR, EF normal. LAD / RCA/ LCx arteries widely patent.  He is s/p Medtronic LINQ Loop insertion (Serial # [id number]) 05/13/2016 with recent remote device check performed by Dr. Caryl Comes 10/23/2018 without atrial fibrillation.  He is also s/p inspire hypoglossal nerve stimulator 12/08/2017 for OSA with poor CPAP tolerance.  07/05/2017 echo showed mild LVH, EF 60 to 65%, no regional wall motion abnormalities, G1DD, aorta with sinus valsalva dilated at 4.0 cm (3.5cm at aortic root), borderline RAE, trivial TR, PASP  77mmHg.  He was recently seen by his PCP for 1 year of melena on iron supplement (ferrous sulfate 325mg  TID)  In the setting of DAPT with ASA and Plavix. He noted rar low abdominal pain or feelings of abdominal distention, rare diarrhea, and occasional/rare heartburn.  2014 colonoscopy showed no acute findings other than internal hemorrhoids.  FOBT was performed and negative. It was recommended that he stop oral NSAIDs given history of chronic disease and symptoms suggested gastritis with increased risk for GIB.  It was also  recommended he hold iron to see if melena resolves and continue ASA and Plavix unless clear s/sx of GIB. Per patient, his melena has persisted but he has continued to take his iron supplementation.  Yesterday, 11/27/2018, the patient reportedly felt fatigued and not himself all day long.  Per his wife, he was pale and had "puffy eyes."  At some point in the late afternoon, the patient felt onset of chest pressure, described as " feeling as if something was stuck in his throat."  He denied a past history of GERD or association of chest discomfort with eating.  He denied experiencing CP like this before in the past, including with his previous stents as above. The chest discomfort was further described as a pressure 5/10 in severity and "almost a nausea" that began at rest and while sitting around the house and continued throughout the day. It did not radiate. It was associated with SOB/DOE and, per patient, eventually pleuritic in character. He denied any clear aggravating or alleviating factors. He reportedly took a SL nitro that same evening without  relief. Finally, he decided to go to sleep early due to his ongoing discomfort. When he laid down in bed, however, he had sudden onset of diaphoresis and nausea without emesis. After this resolved, he finally fell asleep. He then awoke in the middle of the night, still noting continued chest discomfort.  He decided to use the restroom and noted  he very weak and as if he may pass out while going to the restroom and sitting on the toilet. He did admit to episodes of presyncope like this before in the past; however, he reported these episodes are usually not associated with chest discomfort, which worried him. On his way back from the restroom, he started to fall and caught himself on the bed frame, waking his wife. He did not hit his head or the ground. He continued to have ongoing chest discomfort throughout the early morning. At some point, he noticed the chest pressure moved from substernal discomfort to more left and lateral chest pain, located around his 5th ICS but not TTP.  Later that morning, his chest pain/pressure resolved without any clear alleviating factor(s).  Due to his symptoms, however, he decided to go to the ED. In the ED, he continued to be CP free.   In the ED, chest x-ray showed no active cardiopulmonary disease.  CTA showed no evidence of pulmonary embolism or pericardial effusion.  Multiple chronic thoracic spine vertebral body compression deformities were noted but not significantly changed in appearance compared to 02/09/2018 scan.  Labs significant for sodium 135, potassium 4.9, creatinine 1.55, BUN 62, HS troponin negative and flat trending 5  6, WBC 8.2, hemoglobin 11.2, RBC 3.44, Ddimer 1047.60. EKG showed normal sinus rhythm, 99 bpm,nonspecific T wave abnormality in lateral leads versus artifact, poor conduction via aVL, and no acute changes from 03/2018 EKG.  No current chest pain, palpitations, racing heart rate, shortness of breath, or any other cardiac symptoms reported at time of cardiac consultation in the emergency department.  Heart Pathway Score:     Past Medical History:  Diagnosis Date   Anemia    Anxiety    Asymptomatic LV dysfunction    50-55% by echo 06/2016   Berger's disease    CAD (coronary artery disease) 06/2005   PCI of LAD and RCA   Depression    Dilated aortic root (HCC)    4cm at  sinus of Valsalva   Hematuria    Hyperlipidemia    Hypothyroidism    Insomnia    Left ureteral stone    Orthostatic hypotension    negative tilt table   OSA (obstructive sleep apnea)    moderate with AHI 17/hr, uses CPAP nightly   PVC's (premature ventricular contractions) 07/07/2016   Renal disorder    PT IS SEEING DR. Lorrene Reid- NEPHROLOGIST   Rosacea    Urticaria     Past Surgical History:  Procedure Laterality Date   CARDIAC CATHETERIZATION  09/06/2013   [facility]   CARDIAC CATHETERIZATION     CORONARY ANGIOPLASTY  2004   STENT PLACEMENT   CYSTOSCOPY WITH RETROGRADE PYELOGRAM, URETEROSCOPY AND STENT PLACEMENT Left 03/19/2014   Procedure: CYSTOSCOPY WITH RETROGRADE PYELOGRAM, URETEROSCOPY AND STENT PLACEMENT;  Surgeon: Junious Dresser, MD;  Location: [facility];  Service: Urology;  Laterality: Left;   CYSTOSCOPY WITH RETROGRADE PYELOGRAM, URETEROSCOPY AND STENT PLACEMENT Bilateral 05/20/2015   Procedure:  CYSTOSCOPY WITH BILATERAL RETROGRADE PYELOGRAM,RIGHT  DIAGNOSTIC URETEROSCOPY ,LEFT URETEROSCOPY WITH HOLMIUM LASER  AND BILATERAL  STENT PLACEMENT ;  Surgeon:  Alexis Frock, MD;  Location: [facility];  Service: Urology;  Laterality: Bilateral;   DRUG INDUCED ENDOSCOPY N/A 10/20/2017   Procedure: DRUG INDUCED SLEEP ENDOSCOPY;  Surgeon: Melida Quitter, MD;  Location: [place];  Service: ENT;  Laterality: N/A;   EP IMPLANTABLE DEVICE N/A 05/13/2016   Procedure: Loop Recorder Insertion;  Surgeon: Deboraha Sprang, MD;  Location: [place] [facility];  Service: Cardiovascular;  Laterality: N/A;   HOLMIUM LASER APPLICATION Bilateral 10/10/945   Procedure: HOLMIUM LASER APPLICATION;  Surgeon: Alexis Frock, MD;  Location: [facility];  Service: Urology;  Laterality: Bilateral;   IMPLIMENTATION OF A HYPOGLOSSAL NERVE STIMULATOR  12/08/2017   OSA    LEFT HEART CATHETERIZATION WITH CORONARY ANGIOGRAM N/A 09/06/2013   Procedure: LEFT HEART CATHETERIZATION WITH CORONARY  ANGIOGRAM;  Surgeon: Sinclair Grooms, MD;  Location: [facility];  Service: Cardiovascular;  Laterality: N/A;   LEFT KNEE CAP  SURGERY ABOUT 40 YRS AGO  ? 1970     STONE EXTRACTION WITH BASKET Left 03/19/2014   Procedure: STONE EXTRACTION WITH BASKET;  Surgeon: Junious Dresser, MD;  Location: [facility];  Service: Urology;  Laterality: Left;     Home Medications:  Prior to Admission medications   Medication Sig Start Date End Date Taking? Authorizing Provider  alendronate (FOSAMAX) 70 MG tablet Take 1 tablet (70 mg total) by mouth every 7 (seven) days. Take with a full glass of water on an empty stomach. 02/22/18  Yes Bedsole, Amy E, MD  aspirin EC 81 MG tablet Take 81 mg by mouth at bedtime.    Yes [provider]  atorvastatin (LIPITOR) 80 MG tablet Take 1 tablet (80 mg total) by mouth daily. 02/22/18  Yes Bedsole, Amy E, MD  clopidogrel (PLAVIX) 75 MG tablet TAKE 1 TABLET DAILY 11/07/18  Yes Turner, Traci R, MD  cyclobenzaprine (FLEXERIL) 10 MG tablet TAKE 1/2-1 TAB BY MOUTH AT BEDTIME AS NEEDED FOR MUSCLE SPASMS 11/01/18  Yes Bedsole, Amy E, MD  diclofenac (VOLTAREN) 75 MG EC tablet TAKE 1 TABLET BY MOUTH TWICE A DAY 11/23/18  Yes Bedsole, Amy E, MD  ferrous sulfate 325 (65 FE) MG tablet TAKE 1 TABLET BY MOUTH 3 TIMES DAILY WITH MEALS. 02/20/18  Yes Bedsole, Amy E, MD  FLUoxetine (PROZAC) 20 MG capsule TAKE 1 CAPSULE DAILY. TAKE WITH 40MG  TO EQUAL 60MG     ONCE DAILY 07/30/18  Yes Bedsole, Amy E, MD  FLUoxetine (PROZAC) 40 MG capsule TAKE 1 CAPSULE EVERY       MORNING 02/05/18  Yes Bedsole, Amy E, MD  furosemide (LASIX) 20 MG tablet TAKE 1 TABLET DAILY 04/10/18  Yes Imogene Burn, PA-C  levothyroxine (SYNTHROID, LEVOTHROID) 25 MCG tablet Take 1 tablet (25 mcg total) by mouth daily before breakfast. 03/14/18  Yes Bedsole, Amy E, MD  midodrine (PROAMATINE) 5 MG tablet TAKE 1 TABLET (5 MG TOTAL) BY MOUTH 2 (TWO) TIMES DAILY WITH BREAKFAST AND LUNCH. 03/14/18  Yes Turner, Traci R, MD   mirtazapine (REMERON) 15 MG tablet TAKE 1 TABLET BY MOUTH EVERYDAY AT BEDTIME 10/15/18  Yes Bedsole, Amy E, MD  vitamin B-12 (CYANOCOBALAMIN) 1000 MCG tablet Take 1,000 mcg by mouth daily.    Yes [provider]  zolpidem (AMBIEN) 5 MG tablet TAKE 1 TABLET BY MOUTH AT BEDTIME AS NEEDED FOR SLEEP 09/09/18  Yes [provider]  diclofenac sodium (VOLTAREN) 1 % GEL Apply 4 g topically 4 (four) times daily. Patient not taking: Reported on  11/28/2018 11/09/18   Bedsole, Amy E, MD  fludrocortisone (FLORINEF) 0.1 MG tablet Take 1 tablet (0.1 mg total) by mouth daily. Patient not taking: Reported on 11/28/2018 07/19/16   Imogene Burn, PA-C  Fluocinolone Acetonide Body 0.01 % OIL APPLY 1 ML ON THE SKIN TWICE A DAY 02/16/18   [provider]  hydrOXYzine (ATARAX/VISTARIL) 25 MG tablet TAKE 1 TABLET BY MOUTH AT BEDTIME MAY MAKE DROWSY 02/14/18   [provider]  nitroGLYCERIN (NITROSTAT) 0.4 MG SL tablet Place 1 tablet (0.4 mg total) under the tongue every 5 (five) minutes as needed for chest pain. 03/30/18   Elby Beck, FNP  Teriparatide, Recombinant, (FORTEO) 600 MCG/2.4ML SOLN Inject 20 mcg daily into the skin.  12/28/16   [provider]    Inpatient Medications: Scheduled Meds:  Continuous Infusions:  PRN Meds:   Allergies:    Allergies  Allergen Reactions   Codeine Nausea Only   Tape Other (See Comments)    Use only paper tape. Severe blistering and bruising with other tapes. No cloth tape.    Social History:   Social History   Socioeconomic History   Marital status: Married    Spouse name: Not on file   Number of children: Not on file   Years of education: Not on file   Highest education level: Not on file  Occupational History   Occupation: retired Architect strain: Not on file   Food insecurity    Worry: Not on file    Inability: Not on file   Transportation needs    Medical:  Not on file    Non-medical: Not on file  Tobacco Use   Smoking status: Never Smoker   Smokeless tobacco: Never Used  Substance and Sexual Activity   Alcohol use: Yes    Alcohol/week: 1.0 standard drinks    Types: 1 Cans of beer per week    Comment: Occasional beer   Drug use: No   Sexual activity: Yes    Partners: Female    Birth control/protection: None  Lifestyle   Physical activity    Days per week: Not on file    Minutes per session: Not on file   Stress: Not on file  Relationships   Social connections    Talks on phone: Not on file    Gets together: Not on file    Attends religious service: Not on file    Active member of club or organization: Not on file    Attends meetings of clubs or organizations: Not on file    Relationship status: Not on file   Intimate partner violence    Fear of current or ex partner: Not on file    Emotionally abused: Not on file    Physically abused: Not on file    Forced sexual activity: Not on file  Other Topics Concern   Not on file  Social History Narrative   Regular exercise--yes--jog 5 miles 6 days a week    Family History:    Family History  Problem Relation Age of Onset   Coronary artery disease Mother    Heart attack Mother    Heart disease Mother    Hypertension Mother    Cancer Father        lung and colon   Lung cancer Father        smoker   Diabetes Brother      ROS:  Please see the  history of present illness.  Review of Systems  Constitutional: Positive for diaphoresis. Negative for chills and fever.       Diaphoresis not current  Respiratory: Positive for shortness of breath. Negative for cough, hemoptysis, sputum production and wheezing.        SOB not current  Cardiovascular: Positive for chest pain. Negative for palpitations, orthopnea and leg swelling.       CP not current  Gastrointestinal: Positive for nausea. Negative for abdominal pain, blood in stool, constipation, diarrhea,  heartburn, melena and vomiting.  Genitourinary: Negative for dysuria and hematuria.  Musculoskeletal: Negative for falls.       Caught himself before falling overnight 8/18-8/19  Neurological: Positive for weakness. Negative for loss of consciousness.  All other systems reviewed and are negative.   All other ROS reviewed and negative.     Physical Exam/Data:   Vitals:   11/28/18 1000 11/28/18 1030 11/28/18 1100 11/28/18 1130  BP: 127/79 121/82 (!) 141/73 133/78  Pulse: 80 75 75 82  Resp: (!) 22 19 (!) 21 (!) 22  Temp:      TempSrc:      SpO2: 99% 99% 100% 100%  Weight:      Height:       No intake or output data in the 24 hours ending 11/28/18 1156 Last 3 Weights 11/28/2018 11/09/2018 03/30/2018  Weight (lbs) 178 lb 9.2 oz 179 lb 171 lb  Weight (kg) 81 kg 81.194 kg 77.565 kg  Some encounter information is confidential and restricted. Go to Review Flowsheets activity to see all data.     Body mass index is 24.22 kg/m.  General:  Well nourished, well developed, in no acute distress HEENT: normal Neck: no JVD Vascular: No carotid bruits; radial pulses 2+ bilaterally Cardiac:  normal S1, S2; RRR; no murmur  Lungs:  clear to auscultation bilaterally, no wheezing, rhonchi or rales  Abd: soft, nontender, no hepatomegaly  Ext: no edema Musculoskeletal:  No deformities, BUE and BLE strength normal and equal Skin: warm and dry  Neuro:  No focal abnormalities noted Psych:  Normal affect   EKG:  The EKG was personally reviewed and demonstrates:   EKG showed normal sinus rhythm, 99 bpm,nonspecific T wave abnormality in lateral leads versus artifact, poor conduction via aVL, LVH, no acute ST/T changes from 03/2018 EKG. Telemetry:  Telemetry was personally reviewed and demonstrates:  SR  Relevant CV Studies: 07/05/2017 Echo Left ventricle: The cavity size was normal. Wall thickness was   increased in a pattern of mild LVH. Systolic function was normal.   The estimated ejection  fraction was in the range of 60% to 65%.   Wall motion was normal; there were no regional wall motion   abnormalities. Doppler parameters are consistent with abnormal   left ventricular relaxation (grade 1 diastolic dysfunction). The   E/e&' ratio is <8, suggesting normal LV filling pressure. - Aorta: The sinus of valsalva is dilated at 4.0 cm. - Left atrium: The atrium was normal in size. - Right atrium: The atrium was at the upper limits of normal in   size. - Tricuspid valve: There was trivial regurgitation. - Pulmonary arteries: PA peak pressure: 22 mm Hg (S). - Inferior vena cava: The vessel was normal in size. The   respirophasic diameter changes were in the normal range (= 50%),   consistent with normal central venous pressure.  Impressions:  - Compared to a prior study in 06/2016, the LVEF is higher at   60-65%.  The aorta is dilated to 4.0 cm at the sinus of valsalva,   but measures 3.5 cm at the aortic root.  LHC  08/2013 The left main coronary artery is normal.  The left anterior descending artery is widely patent. The proximal LAD stent contains distal margin 30% narrowing due to ISR.[name]  The left circumflex artery is widely patent.  The right coronary artery is widely patent. The mid right coronary stent is without evidence of ISR.   Laboratory Data:  High Sensitivity Troponin:   Recent Labs  Lab 11/28/18 0903 11/28/18 1035  TROPONINIHS 5 6     Cardiac EnzymesNo results for input(s): TROPONINI in the last 168 hours. No results for input(s): TROPIPOC in the last 168 hours.  Chemistry Recent Labs  Lab 11/28/18 0903  NA 135  K 4.9  CL 105  CO2 21*  GLUCOSE 155*  BUN 62*  CREATININE 1.55*  CALCIUM 9.2  GFRNONAA 45*  GFRAA 53*  ANIONGAP 9    No results for input(s): PROT, ALBUMIN, AST, ALT, ALKPHOS, BILITOT in the last 168 hours. Hematology Recent Labs  Lab 11/28/18 0903  WBC 8.2  RBC 3.44*  HGB 11.2*  HCT 34.7*  MCV 100.9*  MCH 32.6  MCHC  32.3  RDW 14.1  PLT 252   BNPNo results for input(s): BNP, PROBNP in the last 168 hours.  DDimer No results for input(s): DDIMER in the last 168 hours.   Radiology/Studies:  Ct Angio Chest Pe W And/or Wo Contrast  Result Date: 11/28/2018 CLINICAL DATA:  Chest pressure, shortness of breath EXAM: CT ANGIOGRAPHY CHEST WITH CONTRAST TECHNIQUE: Multidetector CT imaging of the chest was performed using the standard protocol during bolus administration of intravenous contrast. Multiplanar CT image reconstructions and MIPs were obtained to evaluate the vascular anatomy. CONTRAST:  53mL OMNIPAQUE IOHEXOL 350 MG/ML SOLN COMPARISON:  06/20/2013, 02/09/2018 FINDINGS: Cardiovascular: Satisfactory opacification of the pulmonary arteries to the segmental level. No evidence of pulmonary embolism. Normal heart size. No pericardial effusion. Mediastinum/Nodes: No enlarged mediastinal, hilar, or axillary lymph nodes. Thyroid gland, trachea, and esophagus demonstrate no significant findings. Lungs/Pleura: Chronic posterior biapical pleuroparenchymal scarring. Lungs are otherwise clear. No focal airspace consolidation, pleural effusion, or pneumothorax. Upper Abdomen: No acute abnormality. Musculoskeletal: Right chest wall stimulator. Left chest wall loop recorder. Chronic compression deformity T12 status post cement augmentation. Additional chronic mild superior endplate compression deformities of T7, T8, and T9, which were apparent on prior x-ray 02/09/2018. No new or acute osseous abnormality identified. Review of the MIP images confirms the above findings. IMPRESSION: 1. Negative for PE.  Lungs are clear. 2. Multiple chronic thoracic spine vertebral body compression deformities, not significantly changed in appearance compared to 02/09/2018. Electronically Signed   By: Davina Poke M.D.   On: 11/28/2018 10:28   Dg Chest Port 1 View  Result Date: 11/28/2018 CLINICAL DATA:  Chest pain. EXAM: PORTABLE CHEST 1 VIEW  COMPARISON:  Radiographs of December 08, 2017. FINDINGS: The heart size and mediastinal contours are within normal limits. Both lungs are clear. No pneumothorax or pleural effusion is noted. Stable position of stimulator seen over right chest. The visualized skeletal structures are unremarkable. IMPRESSION: No active disease. Electronically Signed   By: Marijo Conception M.D.   On: 11/28/2018 08:35    Assessment and Plan:   Atypical Chest pain  H/o CAD and PCI to LAD and RCA --No current chest pain.  EKG without acute ST/T changes.  Troponin has been negative. Most recent Medtronic download  negative for atrial fibrillation as below.  --History of CAD s/p PCI to LAD and RCA (2007) and repeat 2015 cath for ongoing CP despite low risk stress showing pLAD stent with distal margin 30% narrowing due to ISR. Most recent 2019 echo with mildly dilated aortic root, G1DD, no RWMA, EF 60-65%. --Chest pain with both typical and atypical features; however, given he reports fatigue and SOB similar to that felt before cath with PCI / stents with h/o CAD and risk factors for cardiac etiology, will keep overnight for further ischemic workup with stress test in the AM (8/20). If stress test low risk, could also consider GI etiology, given HPI above with recent PCP suspicion for gastritis and increased risk for GI bleed. Patient also reported feeling of "getting food stuck" in his esophagus. Given on DAPT with PCP suspicion for gastritis, strong recommendation for daily CBC and monitor for any sudden drops in Hgb or s/sx of GIB. Currently denies s/sx of GIB. Started on heart healthy diet with n.p.o. order for after midnight in preparation for stress test. --Continue medical management with ASA 81 mg daily, Plavix 75 mg daily, statin therapy, and sublingual nitroglycerin as needed for chest pain.  BP and HR has been labile thus far with some softer pressure and rates.  Considering the patient's history of orthostatic hypotension  and recent weakness / presyncope, defer addition of beta-blocker.  No current indication for heparin or cath at this time.  --Stress test 8/20 with further recommendations at that time.  Orthostatic hypotension --Reported pre-syncope / near fall as above in HPI and associated with chest discomfort. S/p Medtronic LINQ Loop insertion (Serial # [id number]) 05/13/2016 with recent remote device check performed by Dr. Caryl Comes 10/23/2018 without atrial fibrillation. Continue to monitor on telemetry.  HTN --Continue to monitor. Labile pressures so far this admission. Not on PTA BB as above with h/o orthostatic hypotension and syncope.  Macrocytic anemia --Daily CBC as on DAPT.  OSA --S/p inspire hypoglossal nerve stimulator 12/08/2017 for OSA with poor CPAP tolerance.  HLD --Continue statin   For questions or updates, please contact [place] Please consult [email or web address] for contact info under     Signed, Arvil Chaco, PA-C  11/28/2018 11:56 AM

## 2018-11-29 ENCOUNTER — Inpatient Hospital Stay: Payer: Medicare Other

## 2018-11-29 ENCOUNTER — Observation Stay (HOSPITAL_COMMUNITY): Payer: Medicare Other

## 2018-11-29 ENCOUNTER — Encounter: Payer: Self-pay | Admitting: Radiology

## 2018-11-29 DIAGNOSIS — N183 Chronic kidney disease, stage 3 (moderate): Secondary | ICD-10-CM | POA: Diagnosis present

## 2018-11-29 DIAGNOSIS — Z136 Encounter for screening for cardiovascular disorders: Secondary | ICD-10-CM | POA: Diagnosis not present

## 2018-11-29 DIAGNOSIS — R079 Chest pain, unspecified: Secondary | ICD-10-CM | POA: Diagnosis not present

## 2018-11-29 DIAGNOSIS — I469 Cardiac arrest, cause unspecified: Secondary | ICD-10-CM | POA: Diagnosis not present

## 2018-11-29 DIAGNOSIS — R042 Hemoptysis: Secondary | ICD-10-CM | POA: Diagnosis not present

## 2018-11-29 DIAGNOSIS — N289 Disorder of kidney and ureter, unspecified: Secondary | ICD-10-CM | POA: Diagnosis present

## 2018-11-29 DIAGNOSIS — R109 Unspecified abdominal pain: Secondary | ICD-10-CM | POA: Diagnosis not present

## 2018-11-29 DIAGNOSIS — Z885 Allergy status to narcotic agent status: Secondary | ICD-10-CM | POA: Diagnosis not present

## 2018-11-29 DIAGNOSIS — Z955 Presence of coronary angioplasty implant and graft: Secondary | ICD-10-CM | POA: Diagnosis not present

## 2018-11-29 DIAGNOSIS — I129 Hypertensive chronic kidney disease with stage 1 through stage 4 chronic kidney disease, or unspecified chronic kidney disease: Secondary | ICD-10-CM | POA: Diagnosis present

## 2018-11-29 DIAGNOSIS — K921 Melena: Secondary | ICD-10-CM

## 2018-11-29 DIAGNOSIS — K922 Gastrointestinal hemorrhage, unspecified: Secondary | ICD-10-CM | POA: Diagnosis not present

## 2018-11-29 DIAGNOSIS — D62 Acute posthemorrhagic anemia: Secondary | ICD-10-CM | POA: Diagnosis present

## 2018-11-29 DIAGNOSIS — R0789 Other chest pain: Secondary | ICD-10-CM | POA: Diagnosis not present

## 2018-11-29 DIAGNOSIS — E039 Hypothyroidism, unspecified: Secondary | ICD-10-CM | POA: Diagnosis present

## 2018-11-29 DIAGNOSIS — D649 Anemia, unspecified: Secondary | ICD-10-CM | POA: Diagnosis not present

## 2018-11-29 DIAGNOSIS — I25110 Atherosclerotic heart disease of native coronary artery with unstable angina pectoris: Secondary | ICD-10-CM | POA: Diagnosis present

## 2018-11-29 DIAGNOSIS — R55 Syncope and collapse: Secondary | ICD-10-CM | POA: Diagnosis not present

## 2018-11-29 DIAGNOSIS — Z20828 Contact with and (suspected) exposure to other viral communicable diseases: Secondary | ICD-10-CM | POA: Diagnosis present

## 2018-11-29 DIAGNOSIS — K274 Chronic or unspecified peptic ulcer, site unspecified, with hemorrhage: Secondary | ICD-10-CM | POA: Diagnosis not present

## 2018-11-29 DIAGNOSIS — N186 End stage renal disease: Secondary | ICD-10-CM | POA: Diagnosis not present

## 2018-11-29 DIAGNOSIS — E785 Hyperlipidemia, unspecified: Secondary | ICD-10-CM | POA: Diagnosis present

## 2018-11-29 DIAGNOSIS — D539 Nutritional anemia, unspecified: Secondary | ICD-10-CM | POA: Diagnosis present

## 2018-11-29 DIAGNOSIS — F329 Major depressive disorder, single episode, unspecified: Secondary | ICD-10-CM | POA: Diagnosis not present

## 2018-11-29 DIAGNOSIS — I951 Orthostatic hypotension: Secondary | ICD-10-CM | POA: Diagnosis present

## 2018-11-29 DIAGNOSIS — K269 Duodenal ulcer, unspecified as acute or chronic, without hemorrhage or perforation: Secondary | ICD-10-CM | POA: Diagnosis not present

## 2018-11-29 DIAGNOSIS — K219 Gastro-esophageal reflux disease without esophagitis: Secondary | ICD-10-CM | POA: Diagnosis present

## 2018-11-29 DIAGNOSIS — Z87442 Personal history of urinary calculi: Secondary | ICD-10-CM | POA: Diagnosis not present

## 2018-11-29 DIAGNOSIS — K920 Hematemesis: Secondary | ICD-10-CM | POA: Diagnosis not present

## 2018-11-29 DIAGNOSIS — I25118 Atherosclerotic heart disease of native coronary artery with other forms of angina pectoris: Secondary | ICD-10-CM | POA: Diagnosis not present

## 2018-11-29 DIAGNOSIS — G4700 Insomnia, unspecified: Secondary | ICD-10-CM | POA: Diagnosis present

## 2018-11-29 DIAGNOSIS — M810 Age-related osteoporosis without current pathological fracture: Secondary | ICD-10-CM | POA: Diagnosis present

## 2018-11-29 DIAGNOSIS — D7589 Other specified diseases of blood and blood-forming organs: Secondary | ICD-10-CM | POA: Diagnosis present

## 2018-11-29 DIAGNOSIS — I361 Nonrheumatic tricuspid (valve) insufficiency: Secondary | ICD-10-CM | POA: Diagnosis not present

## 2018-11-29 DIAGNOSIS — R0600 Dyspnea, unspecified: Secondary | ICD-10-CM | POA: Diagnosis not present

## 2018-11-29 DIAGNOSIS — G4733 Obstructive sleep apnea (adult) (pediatric): Secondary | ICD-10-CM | POA: Diagnosis present

## 2018-11-29 DIAGNOSIS — I10 Essential (primary) hypertension: Secondary | ICD-10-CM | POA: Diagnosis not present

## 2018-11-29 DIAGNOSIS — F419 Anxiety disorder, unspecified: Secondary | ICD-10-CM | POA: Diagnosis present

## 2018-11-29 DIAGNOSIS — K264 Chronic or unspecified duodenal ulcer with hemorrhage: Secondary | ICD-10-CM | POA: Diagnosis not present

## 2018-11-29 DIAGNOSIS — F418 Other specified anxiety disorders: Secondary | ICD-10-CM | POA: Diagnosis not present

## 2018-11-29 DIAGNOSIS — G8929 Other chronic pain: Secondary | ICD-10-CM | POA: Diagnosis present

## 2018-11-29 LAB — BASIC METABOLIC PANEL
Anion gap: 5 (ref 5–15)
BUN: 67 mg/dL — ABNORMAL HIGH (ref 8–23)
CO2: 22 mmol/L (ref 22–32)
Calcium: 8.4 mg/dL — ABNORMAL LOW (ref 8.9–10.3)
Chloride: 111 mmol/L (ref 98–111)
Creatinine, Ser: 1.7 mg/dL — ABNORMAL HIGH (ref 0.61–1.24)
GFR calc Af Amer: 47 mL/min — ABNORMAL LOW (ref >60)
GFR calc non Af Amer: 41 mL/min — ABNORMAL LOW (ref >60)
Glucose, Bld: 105 mg/dL — ABNORMAL HIGH (ref 70–99)
Potassium: 4.2 mmol/L (ref 3.5–5.1)
Sodium: 138 mmol/L (ref 135–145)

## 2018-11-29 LAB — NM MYOCAR MULTI W/SPECT W/WALL MOTION / EF
Estimated workload: 1 METS
Exercise duration (min): 0 min
Exercise duration (sec): 0 s
LV dias vol: 63 mL (ref 62–150)
LV sys vol: 27 mL
MPHR: 152 {beats}/min
Peak HR: 115 {beats}/min
Percent HR: 75 %
Rest HR: 91 {beats}/min
SDS: 0
SRS: 8
SSS: 1
TID: 0.84

## 2018-11-29 LAB — HEMOGLOBIN AND HEMATOCRIT, BLOOD
HCT: 27.9 % — ABNORMAL LOW (ref 39.0–52.0)
HCT: 26.1 % — ABNORMAL LOW (ref 39.0–52.0)
Hemoglobin: 8.8 g/dL — ABNORMAL LOW (ref 13.0–17.0)
Hemoglobin: 8.2 g/dL — ABNORMAL LOW (ref 13.0–17.0)

## 2018-11-29 LAB — FOLATE: Folate: 15.9 ng/mL (ref >5.9)

## 2018-11-29 LAB — CBC
HCT: 27 % — ABNORMAL LOW (ref 39.0–52.0)
Hemoglobin: 8.5 g/dL — ABNORMAL LOW (ref 13.0–17.0)
MCH: 32.2 pg (ref 26.0–34.0)
MCHC: 31.5 g/dL (ref 30.0–36.0)
MCV: 102.3 fL — ABNORMAL HIGH (ref 80.0–100.0)
Platelets: 216 10*3/uL (ref 150–400)
RBC: 2.64 MIL/uL — ABNORMAL LOW (ref 4.22–5.81)
RDW: 14.3 % (ref 11.5–15.5)
WBC: 7.1 10*3/uL (ref 4.0–10.5)
nRBC: 0 % (ref 0.0–0.2)

## 2018-11-29 LAB — GLUCOSE, CAPILLARY: Glucose-Capillary: 148 mg/dL — ABNORMAL HIGH (ref 70–99)

## 2018-11-29 LAB — VITAMIN B12: Vitamin B-12: 433 pg/mL (ref 180–914)

## 2018-11-29 LAB — TSH: TSH: 15.427 u[IU]/mL — ABNORMAL HIGH (ref 0.350–4.500)

## 2018-11-29 LAB — OCCULT BLOOD X 1 CARD TO LAB, STOOL: Fecal Occult Bld: POSITIVE — AB

## 2018-11-29 MED ORDER — TECHNETIUM TC 99M TETROFOSMIN IV KIT
30.0000 | PACK | Freq: Once | INTRAVENOUS | Status: AC | PRN
  Administered 2018-11-29: 30.465 via INTRAVENOUS

## 2018-11-29 MED ORDER — PANTOPRAZOLE SODIUM 40 MG IV SOLR
40.0000 mg | Freq: Two times a day (BID) | INTRAVENOUS | Status: DC
  Administered 2018-11-29 (×2): 40 mg via INTRAVENOUS
  Filled 2018-11-29 (×2): qty 40

## 2018-11-29 MED ORDER — REGADENOSON 0.4 MG/5ML IV SOLN
0.4000 mg | Freq: Once | INTRAVENOUS | Status: AC
  Administered 2018-11-29: 0.4 mg via INTRAVENOUS

## 2018-11-29 MED ORDER — TECHNETIUM TC 99M TETROFOSMIN IV KIT
10.0000 | PACK | Freq: Once | INTRAVENOUS | Status: AC | PRN
  Administered 2018-11-29: 11.704 via INTRAVENOUS

## 2018-11-29 NOTE — Progress Notes (Signed)
This RN called to room by NT as patient was in bathroom having a large black, tarry, bowel movement and became unresponsive. On arrival to bathroom patient was still unresponsive and diaphoretic and no pulse could be felt, code blue called. Patient became responsive and began vomiting but could not completely expell emesis. Team arrived and patient was placed back in bed. VSS stable. CBG checked, CT of head ordered and CXR as well as stool sample sent to lab. Wife at bedside at time of occurrence.  Pastoral care is currently at bedside comforting wife and patient. No acute distress at this time. Will continue to monitor.

## 2018-11-29 NOTE — Consult Note (Signed)
Shawn Lam , MD [address] [place], [place], [place], [address] [address] [place], [place], [place], [address] Phone: [phone]  Fax: [phone]  Consultation  Referring Provider:     Dr. Stark Jock primary Care Physician:  Jinny Sanders, MD Primary Gastroenterologist: None         Reason for Consultation:     Dark stools and anemia  Date of Admission:  11/28/2018 Date of Consultation:  11/29/2018         HPI:   Shawn Lam is a 68 y.o. male history of Buerger's disease, hypothyroidism sleep apnea CAD presented to the hospital with chest pain.  Had a stress test earlier today considered low risk.  He has been on ASA and Plavix.  He had a syncopal episode earlier today attributed to a vasovagal cause.  2 weeks back his hemoglobin was 13.6 g and today it is 8.8 g, MCV 102.3.  On 11/09/2018 iron studies were normal.  He states that he normally has black-colored stool since he takes iron on a daily basis.  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  He has been taking diclofenac 1 to 2 tablets a day for his arthritis for many months.  He describes his stool as almost black in color.  He denies any abdominal pain denies any hematemesis.   Past Medical History:  Diagnosis Date  . Anemia   . Anxiety   . Asymptomatic LV dysfunction    50-55% by echo 06/2016  . Berger's disease   . CAD (coronary artery disease) 06/2005   PCI of LAD and RCA  . Depression   . Dilated aortic root (HCC)    4cm at sinus of Valsalva  . Hematuria   . Hyperlipidemia   . Hypothyroidism   . Insomnia   . Left ureteral stone   . Orthostatic hypotension    negative tilt table  . OSA (obstructive sleep apnea)    moderate with AHI 17/hr, uses CPAP nightly  . PVC's (premature ventricular contractions) 07/07/2016  . Renal disorder    PT IS SEEING DR. Lorrene Reid- NEPHROLOGIST  . Rosacea   . Urticaria     Past Surgical History:  Procedure Laterality Date  . CARDIAC  CATHETERIZATION  09/06/2013   [place]  . CARDIAC CATHETERIZATION    . CORONARY ANGIOPLASTY  2004   STENT PLACEMENT  . CYSTOSCOPY WITH RETROGRADE PYELOGRAM, URETEROSCOPY AND STENT PLACEMENT Left 03/19/2014   Procedure: CYSTOSCOPY WITH RETROGRADE PYELOGRAM, URETEROSCOPY AND STENT PLACEMENT;  Surgeon: Junious Dresser, MD;  Location: [facility];  Service: Urology;  Laterality: Left;  . CYSTOSCOPY WITH RETROGRADE PYELOGRAM, URETEROSCOPY AND STENT PLACEMENT Bilateral 05/20/2015   Procedure:  CYSTOSCOPY WITH BILATERAL RETROGRADE PYELOGRAM,RIGHT  DIAGNOSTIC URETEROSCOPY ,LEFT URETEROSCOPY WITH HOLMIUM LASER  AND BILATERAL  STENT PLACEMENT ;  Surgeon: Alexis Frock, MD;  Location: [facility];  Service: Urology;  Laterality: Bilateral;  . DRUG INDUCED ENDOSCOPY N/A 10/20/2017   Procedure: DRUG INDUCED SLEEP ENDOSCOPY;  Surgeon: Melida Quitter, MD;  Location: [facility];  Service: ENT;  Laterality: N/A;  . EP IMPLANTABLE DEVICE N/A 05/13/2016   Procedure: Loop Recorder Insertion;  Surgeon: Deboraha Sprang, MD;  Location: [facility];  Service: Cardiovascular;  Laterality: N/A;  . HOLMIUM LASER APPLICATION Bilateral 07/11/7406   Procedure: HOLMIUM LASER APPLICATION;  Surgeon: Alexis Frock, MD;  Location[facility];  Service: Urology;  Laterality: Bilateral;  . IMPLIMENTATION OF A HYPOGLOSSAL  NERVE STIMULATOR  12/08/2017   [facility]   . LEFT HEART CATHETERIZATION WITH CORONARY ANGIOGRAM N/A 09/06/2013   Procedure: LEFT HEART CATHETERIZATION WITH CORONARY ANGIOGRAM;  Surgeon: Sinclair Grooms, MD;  Location: [facility];  Service: Cardiovascular;  Laterality: N/A;  . LEFT KNEE CAP  SURGERY ABOUT 40 YRS AGO  ? 1970    . STONE EXTRACTION WITH BASKET Left 03/19/2014   Procedure: STONE EXTRACTION WITH BASKET;  Surgeon: Junious Dresser, MD;  [facility];  Service: Urology;  Laterality: Left;    Prior to Admission medications   Medication Sig Start Date End Date Taking? Authorizing Provider  alendronate  (FOSAMAX) 70 MG tablet Take 1 tablet (70 mg total) by mouth every 7 (seven) days. Take with a full glass of water on an empty stomach. 02/22/18  Yes Bedsole, Amy E, MD  aspirin EC 81 MG tablet Take 81 mg by mouth at bedtime.    Yes [provider]  atorvastatin (LIPITOR) 80 MG tablet Take 1 tablet (80 mg total) by mouth daily. 02/22/18  Yes Bedsole, Amy E, MD  clopidogrel (PLAVIX) 75 MG tablet TAKE 1 TABLET DAILY 11/07/18  Yes Turner, Traci R, MD  cyclobenzaprine (FLEXERIL) 10 MG tablet TAKE 1/2-1 TAB BY MOUTH AT BEDTIME AS NEEDED FOR MUSCLE SPASMS 11/01/18  Yes Bedsole, Amy E, MD  diclofenac (VOLTAREN) 75 MG EC tablet TAKE 1 TABLET BY MOUTH TWICE A DAY 11/23/18  Yes Bedsole, Amy E, MD  ferrous sulfate 325 (65 FE) MG tablet TAKE 1 TABLET BY MOUTH 3 TIMES DAILY WITH MEALS. 02/20/18  Yes Bedsole, Amy E, MD  FLUoxetine (PROZAC) 20 MG capsule TAKE 1 CAPSULE DAILY. TAKE WITH 40MG  TO EQUAL 60MG     ONCE DAILY 07/30/18  Yes Bedsole, Amy E, MD  FLUoxetine (PROZAC) 40 MG capsule TAKE 1 CAPSULE EVERY       MORNING 02/05/18  Yes Bedsole, Amy E, MD  furosemide (LASIX) 20 MG tablet TAKE 1 TABLET DAILY 04/10/18  Yes Imogene Burn, PA-C  levothyroxine (SYNTHROID, LEVOTHROID) 25 MCG tablet Take 1 tablet (25 mcg total) by mouth daily before breakfast. 03/14/18  Yes Bedsole, Amy E, MD  midodrine (PROAMATINE) 5 MG tablet TAKE 1 TABLET (5 MG TOTAL) BY MOUTH 2 (TWO) TIMES DAILY WITH BREAKFAST AND LUNCH. 03/14/18  Yes Turner, Traci R, MD  mirtazapine (REMERON) 15 MG tablet TAKE 1 TABLET BY MOUTH EVERYDAY AT BEDTIME 10/15/18  Yes Bedsole, Amy E, MD  vitamin B-12 (CYANOCOBALAMIN) 1000 MCG tablet Take 1,000 mcg by mouth daily.    Yes [provider]  zolpidem (AMBIEN) 5 MG tablet TAKE 1 TABLET BY MOUTH AT BEDTIME AS NEEDED FOR SLEEP 09/09/18  Yes [provider]  diclofenac sodium (VOLTAREN) 1 % GEL Apply 4 g topically 4 (four) times daily. Patient not taking: Reported on 11/28/2018 11/09/18   Jinny Sanders, MD  fludrocortisone (FLORINEF) 0.1 MG tablet Take 1 tablet (0.1 mg total) by mouth daily. Patient not taking: Reported on 11/28/2018 07/19/16   Imogene Burn, PA-C  Fluocinolone Acetonide Body 0.01 % OIL APPLY 1 ML ON THE SKIN TWICE A DAY 02/16/18   [provider]  hydrOXYzine (ATARAX/VISTARIL) 25 MG tablet TAKE 1 TABLET BY MOUTH AT BEDTIME MAY MAKE DROWSY 02/14/18   [provider]  nitroGLYCERIN (NITROSTAT) 0.4 MG SL tablet Place 1 tablet (0.4 mg total) under the tongue every 5 (five) minutes as needed for chest pain. 03/30/18   Elby Beck, FNP  Teriparatide, Recombinant, (FORTEO) 600 MCG/2.4ML SOLN Inject 20 mcg daily into the skin.  12/28/16   [provider]    Family History  Problem Relation Age of Onset  . Coronary artery disease Mother   . Heart attack Mother   . Heart disease Mother   . Hypertension Mother   . Cancer Father        lung and colon  . Lung cancer Father        smoker  . Diabetes Brother      Social History   Tobacco Use  . Smoking status: Never Smoker  . Smokeless tobacco: Never Used  Substance Use Topics  . Alcohol use: Yes    Alcohol/week: 1.0 standard drinks    Types: 1 Cans of beer per week    Comment: Occasional beer  . Drug use: No    Allergies as of 11/28/2018 - Review Complete 11/28/2018  Allergen Reaction Noted  . Codeine Nausea Only 06/05/2007  . Tape Other (See Comments) 03/19/2014    Review of Systems:    All systems reviewed and negative except where noted in HPI.   Physical Exam:  Vital signs in last 24 hours: Temp:  [97.6 F (36.4 C)-98.7 F (37.1 C)] 98.1 F (36.7 C) (08/20 0727) Pulse Rate:  [83-101] 84 (08/20 1421) Resp:  [19-20] 19 (08/20 0727) BP: (105-128)/(60-77) 119/76 (08/20 1421) SpO2:  [100 %] 100 % (08/20 1421) FiO2 (%):  [40 %] 40 % (08/20 1352) Weight:  [80.1 kg] 80.1 kg (08/20 0438) Last BM Date: 11/29/18 General:   Pleasant, cooperative in NAD Head:  Normocephalic and  atraumatic. Eyes:   No icterus.   Conjunctiva pink. PERRLA. Ears:  Normal auditory acuity. Neck:  Supple; no masses or thyroidomegaly Lungs: Respirations even and unlabored. Lungs clear to auscultation bilaterally.   No wheezes, crackles, or rhonchi.  Heart:  Regular rate and rhythm;  Without murmur, clicks, rubs or gallops Abdomen:  Soft, nondistended, nontender. Normal bowel sounds. No appreciable masses or hepatomegaly.  No rebound or guarding.  Neurologic:  Alert and oriented x3;  grossly normal neurologically. Skin:  Intact without significant lesions or rashes. Cervical Nodes:  No significant cervical adenopathy. Psych:  Alert and cooperative. Normal affect.  LAB RESULTS: Recent Labs    11/28/18 0903 11/29/18 0655 11/29/18 1241  WBC 8.2 7.1  --   HGB 11.2* 8.5* 8.8*  HCT 34.7* 27.0* 27.9*  PLT 252 216  --    BMET Recent Labs    11/28/18 0903 11/29/18 0655  NA 135 138  K 4.9 4.2  CL 105 111  CO2 21* 22  GLUCOSE 155* 105*  BUN 62* 67*  CREATININE 1.55* 1.70*  CALCIUM 9.2 8.4*   LFT No results for input(s): PROT, ALBUMIN, AST, ALT, ALKPHOS, BILITOT, BILIDIR, IBILI in the last 72 hours. PT/INR No results for input(s): LABPROT, INR in the last 72 hours.  STUDIES: Ct Angio Chest Pe W And/or Wo Contrast  Result Date: 11/28/2018 CLINICAL DATA:  Chest pressure, shortness of breath EXAM: CT ANGIOGRAPHY CHEST WITH CONTRAST TECHNIQUE: Multidetector CT imaging of the chest was performed using the standard protocol during bolus administration of intravenous contrast. Multiplanar CT image reconstructions and MIPs were obtained to evaluate the vascular anatomy. CONTRAST:  72mL OMNIPAQUE IOHEXOL 350 MG/ML SOLN COMPARISON:  06/20/2013, 02/09/2018 FINDINGS: Cardiovascular: Satisfactory opacification of the pulmonary arteries to the segmental level. No evidence of pulmonary embolism. Normal heart size. No pericardial effusion. Mediastinum/Nodes: No enlarged mediastinal, hilar, or  axillary  lymph nodes. Thyroid gland, trachea, and esophagus demonstrate no significant findings. Lungs/Pleura: Chronic posterior biapical pleuroparenchymal scarring. Lungs are otherwise clear. No focal airspace consolidation, pleural effusion, or pneumothorax. Upper Abdomen: No acute abnormality. Musculoskeletal: Right chest wall stimulator. Left chest wall loop recorder. Chronic compression deformity T12 status post cement augmentation. Additional chronic mild superior endplate compression deformities of T7, T8, and T9, which were apparent on prior x-ray 02/09/2018. No new or acute osseous abnormality identified. Review of the MIP images confirms the above findings. IMPRESSION: 1. Negative for PE.  Lungs are clear. 2. Multiple chronic thoracic spine vertebral body compression deformities, not significantly changed in appearance compared to 02/09/2018. Electronically Signed   By: Davina Poke M.D.   On: 11/28/2018 10:28   Nm Myocar Multi W/spect W/wall Motion / Ef  Result Date: 11/29/2018  There was no ST segment deviation noted during stress.  No T wave inversion was noted during stress.  The study is normal.  This is a low risk study.  The left ventricular ejection fraction is normal (55-65%).    Dg Chest Port 1 View  Result Date: 11/28/2018 CLINICAL DATA:  Chest pain. EXAM: PORTABLE CHEST 1 VIEW COMPARISON:  Radiographs of December 08, 2017. FINDINGS: The heart size and mediastinal contours are within normal limits. Both lungs are clear. No pneumothorax or pleural effusion is noted. Stable position of stimulator seen over right chest. The visualized skeletal structures are unremarkable. IMPRESSION: No active disease. Electronically Signed   By: Marijo Conception M.D.   On: 11/28/2018 08:35      Impression / Plan:   Shawn Lam is a 68 y.o. y/o male admitted with chest pain.  Had cardiac evaluation and had a negative stress test.  I have been consulted for a drop in hemoglobin.  Baseline  around 13 g previously and today is 8.8 g,macrocytic in nature.  He has been on aspirin and Plavix.  Long-term NSAID use.  Elevation in the BUN/creatinine ratio suggestive of possible upper GI bleed.  Plan 1.  Commence on PPI and continue. 2.  Since he was on Plavix ideally would need to be off of it at least 3 to 5 days before we can perform any endoscopy evaluation.  If stable overnight then will hold off on EGD tomorrow but if continues to drop hemoglobin would have no choice but to proceed with endoscopy tomorrow.  If there are features of active GI bleeding tonight suggest to get nuclear scan to identify the location. 3.  Due to macrocytosis suggest check B12, TSH and folate. 4.  Suggest to stop all NSAID use.  Thank you for involving me in the care of this patient.      LOS: 0 days   Shawn Bellows, MD  11/29/2018, 2:39 PM

## 2018-11-29 NOTE — ED Provider Notes (Signed)
[facility]  Department of Emergency Medicine   Code Blue CONSULT NOTE  Chief Complaint: Cardiac arrest/unresponsive   Level V Caveat: Unresponsive  History of present illness: I was contacted by the hospital for a CODE BLUE cardiac arrest upstairs and presented to the patient's room.  ROS: Unable to obtain, Level V caveat  Scheduled Meds: . atorvastatin  80 mg Oral Daily  . enoxaparin (LOVENOX) injection  40 mg Subcutaneous Q24H  . ferrous sulfate  325 mg Oral Q breakfast  . fludrocortisone  0.1 mg Oral Daily  . FLUoxetine  60 mg Oral q morning - 10a  . levothyroxine  25 mcg Oral Q0600  . midodrine  5 mg Oral BID WC  . mirtazapine  15 mg Oral QHS  . pantoprazole (PROTONIX) IV  40 mg Intravenous Q12H  . sodium chloride flush  3 mL Intravenous Q12H  . vitamin B-12  1,000 mcg Oral Daily   Continuous Infusions: . sodium chloride     PRN Meds:.sodium chloride, acetaminophen **OR** acetaminophen, HYDROcodone-acetaminophen, hydrOXYzine, nitroGLYCERIN, ondansetron **OR** ondansetron (ZOFRAN) IV, sodium chloride flush, zolpidem Past Medical History:  Diagnosis Date  . Anemia   . Anxiety   . Asymptomatic LV dysfunction    50-55% by echo 06/2016  . Berger's disease   . CAD (coronary artery disease) 06/2005   PCI of LAD and RCA  . Depression   . Dilated aortic root (HCC)    4cm at sinus of Valsalva  . Hematuria   . Hyperlipidemia   . Hypothyroidism   . Insomnia   . Left ureteral stone   . Orthostatic hypotension    negative tilt table  . OSA (obstructive sleep apnea)    moderate with AHI 17/hr, uses CPAP nightly  . PVC's (premature ventricular contractions) 07/07/2016  . Renal disorder    PT IS SEEING DR. Lorrene Reid- NEPHROLOGIST  . Rosacea   . Urticaria    Past Surgical History:  Procedure Laterality Date  . CARDIAC CATHETERIZATION  09/06/2013   [place]  . CARDIAC CATHETERIZATION    . CORONARY ANGIOPLASTY  2004   STENT PLACEMENT  . CYSTOSCOPY WITH RETROGRADE  PYELOGRAM, URETEROSCOPY AND STENT PLACEMENT Left 03/19/2014   Procedure: CYSTOSCOPY WITH RETROGRADE PYELOGRAM, URETEROSCOPY AND STENT PLACEMENT;  Surgeon: Junious Dresser, MD;  Location: [facility];  Service: Urology;  Laterality: Left;  . CYSTOSCOPY WITH RETROGRADE PYELOGRAM, URETEROSCOPY AND STENT PLACEMENT Bilateral 05/20/2015   Procedure:  CYSTOSCOPY WITH BILATERAL RETROGRADE PYELOGRAM,RIGHT  DIAGNOSTIC URETEROSCOPY ,LEFT URETEROSCOPY WITH HOLMIUM LASER  AND BILATERAL  STENT PLACEMENT ;  Surgeon: Alexis Frock, MD;  Location[facility];  Service: Urology;  Laterality: Bilateral;  . DRUG INDUCED ENDOSCOPY N/A 10/20/2017   Procedure: DRUG INDUCED SLEEP ENDOSCOPY;  Surgeon: Melida Quitter, MD;  Location: [facility];  Service: ENT;  Laterality: N/A;  . EP IMPLANTABLE DEVICE N/A 05/13/2016   Procedure: Loop Recorder Insertion;  Surgeon: Deboraha Sprang, MD;  Location: [facility];  Service: Cardiovascular;  Laterality: N/A;  . HOLMIUM LASER APPLICATION Bilateral 05/18/8339   Procedure: HOLMIUM LASER APPLICATION;  Surgeon: Alexis Frock, MD;  [facility];  Service: Urology;  Laterality: Bilateral;  . IMPLIMENTATION OF A HYPOGLOSSAL NERVE STIMULATOR  12/08/2017   [facility]   . LEFT HEART CATHETERIZATION WITH CORONARY ANGIOGRAM N/A 09/06/2013   Procedure: LEFT HEART CATHETERIZATION WITH CORONARY ANGIOGRAM;  Surgeon: Sinclair Grooms, MD;  Location: [facility];  Service: Cardiovascular;  Laterality: N/A;  . LEFT KNEE  CAP  SURGERY ABOUT 40 YRS AGO  ? 1970    . STONE EXTRACTION WITH BASKET Left 03/19/2014   Procedure: STONE EXTRACTION WITH BASKET;  Surgeon: Junious Dresser, MD;  [facility];  Service: Urology;  Laterality: Left;   Social History   Socioeconomic History  . Marital status: Married    Spouse name: Not on file  . Number of children: Not on file  . Years of education: Not on file  . Highest education level: Not on file  Occupational History  . Occupation: retired  Designer, jewellery  . Financial resource strain: Not on file  . Food insecurity    Worry: Not on file    Inability: Not on file  . Transportation needs    Medical: Not on file    Non-medical: Not on file  Tobacco Use  . Smoking status: Never Smoker  . Smokeless tobacco: Never Used  Substance and Sexual Activity  . Alcohol use: Yes    Alcohol/week: 1.0 standard drinks    Types: 1 Cans of beer per week    Comment: Occasional beer  . Drug use: No  . Sexual activity: Yes    Partners: Female    Birth control/protection: None  Lifestyle  . Physical activity    Days per week: Not on file    Minutes per session: Not on file  . Stress: Not on file  Relationships  . Social Herbalist on phone: Not on file    Gets together: Not on file    Attends religious service: Not on file    Active member of club or organization: Not on file    Attends meetings of clubs or organizations: Not on file    Relationship status: Not on file  . Intimate partner violence    Fear of current or ex partner: Not on file    Emotionally abused: Not on file    Physically abused: Not on file    Forced sexual activity: Not on file  Other Topics Concern  . Not on file  Social History Narrative   Regular exercise--yes--jog 5 miles 6 days a week   Allergies  Allergen Reactions  . Codeine Nausea Only  . Tape Other (See Comments)    Use only paper tape. Severe blistering and bruising with other tapes. No cloth tape.    Last set of Vital Signs (not current) Vitals:   11/29/18 1416 11/29/18 1421  BP: 120/72 119/76  Pulse: 83 84  Resp:    Temp:    SpO2: 100% 100%      Physical Exam  Gen: Lethargic Cardiovascular: Pulse present Resp: apneic. Breath sounds equal bilaterally   Abd: nondistended  Neuro: GCS 15 HEENT: Unremarkable Musculoskeletal: No deformity  Skin: Cool and diaphoretic   CRITICAL CARE Performed by: Laurence Aly Total critical care time: 10 Critical  care time was exclusive of separately billable procedures and treating other patients. Critical care was necessary to treat or prevent imminent or life-threatening deterioration. Critical care was time spent personally by me on the following activities: development of treatment plan with patient and/or surrogate as well as nursing, discussions with consultants, evaluation of patient's response to treatment, examination of patient, obtaining history from patient or surrogate, ordering and performing treatments and interventions, ordering and review of laboratory studies, ordering and review of radiographic studies, pulse oximetry and re-evaluation of patient's condition.  Cardiopulmonary Resuscitation (CPR) Procedure Note  Directed/Performed by: Rulon Eisenmenger  Breshears I personally directed ancillary staff and/or performed CPR in an effort to regain return of spontaneous circulation and to maintain cardiac, neuro and systemic perfusion.    Medical Decision making  Vasovagal syncope, occult GI bleeding, arrhythmia, MI, PE, dissection, hypoglycemia, sepsis  Assessment and Plan  Syncope  I was contacted by the hospital operator and presented to the patient's room after the announcement of [place].  He was on the toilet, was diaphoretic and pale.  Had recently syncopized.  Clinically he smells like he has gastrointestinal bleeding.  He never lost a pulse or consciousness while I was there.  We placed him in the bed, vital signs appeared to be stable.  He will need a further work-up including hopefully Hemoccult testing and stepdown monitoring.   Earleen Newport, MD 11/29/18 [phone]

## 2018-11-29 NOTE — Progress Notes (Addendum)
Progress Note  Patient Name: Shawn Lam Date of Encounter: 11/29/2018  Primary Cardiologist: Fransico Him, MD  Subjective   No chest pain this morning.  He denies any complaint of rapid heart rate or palpitations.  Did note 1 (one) additional episode of feeling as if he may pass out overnight.  Otherwise, no complaints.   Inpatient Medications    Scheduled Meds: . aspirin EC  81 mg Oral QHS  . atorvastatin  80 mg Oral Daily  . clopidogrel  75 mg Oral Daily  . enoxaparin (LOVENOX) injection  40 mg Subcutaneous Q24H  . ferrous sulfate  325 mg Oral Q breakfast  . fludrocortisone  0.1 mg Oral Daily  . FLUoxetine  60 mg Oral q morning - 10a  . levothyroxine  25 mcg Oral Q0600  . midodrine  5 mg Oral BID WC  . mirtazapine  15 mg Oral QHS  . sodium chloride flush  3 mL Intravenous Q12H  . vitamin B-12  1,000 mcg Oral Daily   Continuous Infusions: . sodium chloride     PRN Meds: sodium chloride, acetaminophen **OR** acetaminophen, HYDROcodone-acetaminophen, hydrOXYzine, nitroGLYCERIN, ondansetron **OR** ondansetron (ZOFRAN) IV, sodium chloride flush, zolpidem   Vital Signs    Vitals:   11/28/18 1432 11/28/18 1945 11/29/18 0438 11/29/18 0727  BP: 115/75 123/73 108/60 105/65  Pulse: 76 95 (!) 101 91  Resp: 20 20 20 19   Temp: 97.8 F (36.6 C) 98.7 F (37.1 C) 97.6 F (36.4 C) 98.1 F (36.7 C)  TempSrc: Oral Oral Oral Oral  SpO2: 98% 100% 100% 100%  Weight:   80.1 kg   Height:        Intake/Output Summary (Last 24 hours) at 11/29/2018 1107 Last data filed at 11/29/2018 0736 Gross per 24 hour  Intake 3 ml  Output 1600 ml  Net -1597 ml   Last 3 Weights 11/29/2018 11/28/2018 11/28/2018  Weight (lbs) 176 lb 9.6 oz 176 lb 9.6 oz 178 lb 9.2 oz  Weight (kg) 80.105 kg 80.105 kg 81 kg  Some encounter information is confidential and restricted. Go to Review Flowsheets activity to see all data.      Telemetry    SR-ST, 80s-low 100s - Personally Reviewed  ECG    No  new tracings - Personally Reviewed  Physical Exam   GEN: No acute distress.  Seen prior to stress test.  Neck: No JVD  Cardiac: RRR, no murmurs, rubs, or gallops.  Respiratory: Clear to auscultation bilaterally. GI: Soft, nontender, non-distended  MS: No edema; No deformity. Neuro:  Nonfocal  Psych: Normal affect   Labs    High Sensitivity Troponin:   Recent Labs  Lab 11/28/18 0903 11/28/18 1035  TROPONINIHS 5 6      Cardiac EnzymesNo results for input(s): TROPONINI in the last 168 hours. No results for input(s): TROPIPOC in the last 168 hours.   Chemistry Recent Labs  Lab 11/28/18 0903 11/29/18 0655  NA 135 138  K 4.9 4.2  CL 105 111  CO2 21* 22  GLUCOSE 155* 105*  BUN 62* 67*  CREATININE 1.55* 1.70*  CALCIUM 9.2 8.4*  GFRNONAA 45* 41*  GFRAA 53* 47*  ANIONGAP 9 5     Hematology Recent Labs  Lab 11/28/18 0903 11/29/18 0655  WBC 8.2 7.1  RBC 3.44* 2.64*  HGB 11.2* 8.5*  HCT 34.7* 27.0*  MCV 100.9* 102.3*  MCH 32.6 32.2  MCHC 32.3 31.5  RDW 14.1 14.3  PLT 252 216  BNPNo results for input(s): BNP, PROBNP in the last 168 hours.   DDimer No results for input(s): DDIMER in the last 168 hours.   Radiology    Ct Angio Chest Pe W And/or Wo Contrast  Result Date: 11/28/2018 CLINICAL DATA:  Chest pressure, shortness of breath EXAM: CT ANGIOGRAPHY CHEST WITH CONTRAST TECHNIQUE: Multidetector CT imaging of the chest was performed using the standard protocol during bolus administration of intravenous contrast. Multiplanar CT image reconstructions and MIPs were obtained to evaluate the vascular anatomy. CONTRAST:  1mL OMNIPAQUE IOHEXOL 350 MG/ML SOLN COMPARISON:  06/20/2013, 02/09/2018 FINDINGS: Cardiovascular: Satisfactory opacification of the pulmonary arteries to the segmental level. No evidence of pulmonary embolism. Normal heart size. No pericardial effusion. Mediastinum/Nodes: No enlarged mediastinal, hilar, or axillary lymph nodes. Thyroid gland,  trachea, and esophagus demonstrate no significant findings. Lungs/Pleura: Chronic posterior biapical pleuroparenchymal scarring. Lungs are otherwise clear. No focal airspace consolidation, pleural effusion, or pneumothorax. Upper Abdomen: No acute abnormality. Musculoskeletal: Right chest wall stimulator. Left chest wall loop recorder. Chronic compression deformity T12 status post cement augmentation. Additional chronic mild superior endplate compression deformities of T7, T8, and T9, which were apparent on prior x-ray 02/09/2018. No new or acute osseous abnormality identified. Review of the MIP images confirms the above findings. IMPRESSION: 1. Negative for PE.  Lungs are clear. 2. Multiple chronic thoracic spine vertebral body compression deformities, not significantly changed in appearance compared to 02/09/2018. Electronically Signed   By: Davina Poke M.D.   On: 11/28/2018 10:28   Dg Chest Port 1 View  Result Date: 11/28/2018 CLINICAL DATA:  Chest pain. EXAM: PORTABLE CHEST 1 VIEW COMPARISON:  Radiographs of December 08, 2017. FINDINGS: The heart size and mediastinal contours are within normal limits. Both lungs are clear. No pneumothorax or pleural effusion is noted. Stable position of stimulator seen over right chest. The visualized skeletal structures are unremarkable. IMPRESSION: No active disease. Electronically Signed   By: Marijo Conception M.D.   On: 11/28/2018 08:35    Cardiac Studies   Pending stress test from today   07/05/2017 Echo Left ventricle: The cavity size was normal. Wall thickness was increased in a pattern of mild LVH. Systolic function was normal. The estimated ejection fraction was in the range of 60% to 65%. Wall motion was normal; there were no regional wall motion abnormalities. Doppler parameters are consistent with abnormal left ventricular relaxation (grade 1 diastolic dysfunction). The E/e&' ratio is <8, suggesting normal LV filling pressure. -  Aorta: The sinus of valsalva is dilated at 4.0 cm. - Left atrium: The atrium was normal in size. - Right atrium: The atrium was at the upper limits of normal in size. - Tricuspid valve: There was trivial regurgitation. - Pulmonary arteries: PA peak pressure: 22 mm Hg (S). - Inferior vena cava: The vessel was normal in size. The respirophasic diameter changes were in the normal range (= 50%), consistent with normal central venous pressure.  Impressions:  - Compared to a prior study in 06/2016, the LVEF is higher at 60-65%. The aorta is dilated to 4.0 cm at the sinus of valsalva, but measures 3.5 cm at the aortic root.  LHC  08/2013 The left main coronary artery is normal.  The left anterior descending artery is widely patent. The proximal LAD stent contains distal margin 30% narrowing due to ISR.[name]  The left circumflex artery is widely patent.  The right coronary artery is widely patent. The mid right coronary stent is without evidence of ISR.  Patient Profile     68 y.o. male hx of CAD s/p PCI to LAD and RCA (2007), 2015 cath showing pLAD stent with distal margin 30% narrowing due to ISR, 2019 echo with mildly dilated aortic root, orthostatic hypertension and palpitations s/p Medtronic LINQ Loop insertion (Serial # [id number]) 05/13/2016, HLD, macrocytic anemia with past suspicion for hematuria, recent melena FOBT negative, h/o epistaxis, hypothyroidism, Berger's dz with known CKD, history of lymphedema (lymphedema clinic in [place]), and moderate OSA s/p inserted inspire device 11/2017 due to CPAP intolerance and who is being seen today for the evaluation of chest pain.  Assessment & Plan    Atypical Chest pain  H/o CAD and PCI to LAD and RCA --No current chest pain. Sx of fatigue noted and similar to before PCI. EKG without acute changes. Tn negative. Most recent Medtronic download negative for atrial fibrillation as below. History of CAD s/p PCI to LAD and RCA  (2007); 2015 pLAD stent with distal margin 30% narrowing due to ISR. 2019 echo no RWMA, EF 60-65%. --Overnight observation for AM stress test. If stress test low risk, consider further workup for GI etiology or occult bleed given continued drop in Hgb and overnight drop from 11.2 to 8.5. Past history of melena, hematuria. Closely monitor CBC and BMET/ renal function. Also consider orthostatic hypotension. Patient has loop recorder with no past episodes of arrhythmia that can continue to be monitored. Consider dehydration with current HR elevated and hypotensive. Continue IV hydration.  --Reached out to IM regarding recommendation for GI workup. --Continue medical management with statin therapy.  R/o occult GIB before discharge on DAPT. No BB given orthostatic hypotension. No current indication for heparin or cath at this time. Further recommendations following stress. Schedule for office f/u after discharge.   Possible occult bleed --Hgb drop overnight from 11.2  8.5. Recommend repeat FOBT and r/o for GIB given patient report of melena (with consideration of iron supplementation and past FOBT negative) and PCP note stating suspicion for gastritis. Recommend r/o occult bleed before discharge on DAPT. Consider also hematuria etiology with gastritis as well as known Berger's disease and h/o hematuria. Recommend transfuse for Hgb below 8.0.  Orthostatic hypotension, s/p Loop insertion in 2018 --Consider as possible etiology of fatigue and near syncope. H/o pre-syncope / near fall. --S/p Medtronic LINQ Loop insertion (Serial # [id number]) 05/13/2016 with recent remote device check performed by Dr. Caryl Comes 10/23/2018 without atrial fibrillation. --Continue to monitor on telemetry. --Most recent BP SBP 100s  HTN --Hypotensive currently. Continue to monitor. Labile pressures. Not on PTA BB with h/o orthostatic hypotension and syncope.  CKD, Berger's Disease --Daily BMET. Continue gentle hydration s/p IV  contrast exposure.   Macrocytic anemia, possible Anemia of chronic disease --Daily CBC as on DAPT with PCP reporting possible gastritis / GIB (see EMR documentation).   OSA --S/p inspirehypoglossal nerve stimulator 12/08/2017 for OSAwith poor CPAP tolerance.  HLD --Continue statin  For questions or updates, please contact [place] Please consult [email or web address] for contact info under        Signed, Arvil Chaco, PA-C  11/29/2018, 11:07 AM

## 2018-11-29 NOTE — Progress Notes (Signed)
[facility] [place] NAME: Shawn Lam    MR#:  [id number]  DATE OF BIRTH:  December 29, 1950  SUBJECTIVE:  CHIEF COMPLAINT:   Chief Complaint  Patient presents with  . Chest Pain   Patient denies any complaints at the time of my initial evaluation this morning.  Had stress test done this morning which was negative. Patient was later found to have passed out while in the emergency room and CODE BLUE was called.  Patient later became arousable.  Did not require CPR or any medications.  Found to have dark stools in the bathroom.  Noted drop in hemoglobin.  REVIEW OF SYSTEMS:  ROS CONSTITUTIONAL: No fever, fatigue or weakness.  EYES: No blurred or double vision.  EARS, NOSE, AND THROAT: No tinnitus or ear pain.  RESPIRATORY: No cough, shortness of breath, wheezing or hemoptysis.  CARDIOVASCULAR: Positive chest pain, orthopnea, edema.  GASTROINTESTINAL: No nausea, vomiting, diarrhea or abdominal pain.  GENITOURINARY: No dysuria, hematuria.  ENDOCRINE: No polyuria, nocturia,  HEMATOLOGY: No anemia, easy bruising or bleeding.  Positive for dark stools SKIN: No rash or lesion. MUSCULOSKELETAL: No joint pain or arthritis.   NEUROLOGIC: No tingling, numbness, weakness.  PSYCHIATRY: No anxiety or depression.  DRUG ALLERGIES:   Allergies  Allergen Reactions  . Codeine Nausea Only  . Tape Other (See Comments)    Use only paper tape. Severe blistering and bruising with other tapes. No cloth tape.   VITALS:  Blood pressure 128/84, pulse 93, temperature 98.1 F (36.7 C), temperature source Oral, resp. rate 19, height 6' (1.829 m), weight 80.1 kg, SpO2 100 %. PHYSICAL EXAMINATION:  Physical Exam  GENERAL:  68 y.o.-year-old patient lying in the bed with no acute distress.  EYES: Pupils equal, round, reactive to light and accommodation. No scleral icterus. Extraocular muscles intact.  HEENT: Head atraumatic, normocephalic. Oropharynx and nasopharynx  clear.  NECK:  Supple, no jugular venous distention. No thyroid enlargement, no tenderness.  LUNGS: Normal breath sounds bilaterally, no wheezing, rales,rhonchi or crepitation. No use of accessory muscles of respiration.  CARDIOVASCULAR: S1, S2 normal. No murmurs, rubs, or gallops.  ABDOMEN: Soft, nontender, nondistended. Bowel sounds present. No organomegaly or mass.  EXTREMITIES: No pedal edema, cyanosis, or clubbing.  NEUROLOGIC: Cranial nerves II through XII are intact. Muscle strength 5/5 in all extremities. Sensation intact. Gait not checked.  PSYCHIATRIC: The patient is alert and oriented x 3.  SKIN: No obvious rash, lesion, or ulcer.   LABORATORY PANEL:  Male CBC Recent Labs  Lab 11/29/18 0655 11/29/18 1241  WBC 7.1  --   HGB 8.5* 8.8*  HCT 27.0* 27.9*  PLT 216  --    ------------------------------------------------------------------------------------------------------------------ Chemistries  Recent Labs  Lab 11/29/18 0655  NA 138  K 4.2  CL 111  CO2 22  GLUCOSE 105*  BUN 67*  CREATININE 1.70*  CALCIUM 8.4*   RADIOLOGY:  Nm Myocar Multi W/spect W/wall Motion / Ef  Result Date: 11/29/2018  There was no ST segment deviation noted during stress.  No T wave inversion was noted during stress.  The study is normal.  This is a low risk study.  The left ventricular ejection fraction is normal (55-65%).    ASSESSMENT AND PLAN:   1.  Chest pain with history of coronary artery disease Noted drop in hemoglobin overnight.  Aspirin and Plavix placed on hold. Seen by cardiologist.  Stress test done this morning was negative. CT per PE is negative  2.  Hyperlipidemia continue Lipitor  3.  Chronic orthostatic hypotension continue midodrine  4.  Hypothyroidism continue Synthroid  5.  Depression continue Prozac  6.  Insomnia continue home medication  7.  Chronic kidney disease renal function stable  8.    Syncope. Patient had a syncopal episode while in  the bathroom having a bowel movement.  CODE BLUE was called but patient spontaneously became more awake.  No CPR or medications were administered. This is most likely vasovagal possibly related to suspected GI bleed  9.  Acute blood loss anemia. High clinical suspicion for GI bleed.  Very dark stools noted. Seen by gastroenterologist.  Started on IV Protonix.  Monitor serial hemoglobin and hematocrit. Lovenox for DVT prophylaxis as well as aspirin and Plavix discontinued.  Plans for EGD tomorrow. Kept n.p.o.  Miscellaneous Lovenox for DVT prophylaxis  All the records are reviewed and case discussed with Care Management/Social Worker. Management plans discussed with the patient, family and they are in agreement.  CODE STATUS: Full Code  TOTAL TIME TAKING CARE OF THIS PATIENT: 39 minutes.   More than 50% of the time was spent in counseling/coordination of care: YES  POSSIBLE D/C IN 2 DAYS, DEPENDING ON CLINICAL CONDITION.   Tene Gato M.D on 11/29/2018 at 4:11 PM  Between 7am to 6pm - Pager - [phone]  After 6pm go to [email or web address] - Proofreader  [facility]  Office  [phone]  CC: Primary care physician; Jinny Sanders, MD  Note: This dictation was prepared with Dragon dictation along with smaller phrase technology. Any transcriptional errors that result from this process are unintentional.

## 2018-11-29 NOTE — Progress Notes (Cosign Needed)
    CODE BLUE called after patient syncopal episode in the setting of using the restroom with subsequent emesis episode. Review of telemetry did not show bradycardic rate or arrhythmia. Vitals stable on recheck. Suspect vasovagal episode with wife and patient reporting harder stools and with the episode of emesis. Patient has loop with no sign of arrhythmia on recent downloads. No acute findings on physical exam recheck. Stress test (performed earlier today) low risk. Of note, and also earlier this AM, stopped ASA and Plavix with FOBT ordered in the setting of drop in Hgb overnight. Previous PCP FOBT negative. If repeat FOBT negative, consider alternate occult bleed etiology. IM to consult GI.   Signed, Arvil Chaco, PA-C 11/29/2018, 2:17 PM Pager [phone]

## 2018-11-29 NOTE — Progress Notes (Signed)
Ch responded to code for pt on 2A. Pt was being assisted by the care team while ch provided support for pt's wife who was at bedside. Wife shared that pt has passed out before at home. Ch asked guided questions as the pt wife shared that the pt is active and still goes walking daily. Pt does hv renal failure and sleep apnea. Ch provided compassionate presence while the pt was being stabilized by the staff. Ch prayed at bedside with pt and wife and encouraged them to contact the nurse if they hv further needs.    11/29/18 1400  Clinical Encounter Type  Visited With Health care provider;Patient and family together  Visit Type Spiritual support;Social support;Psychological support;Code  Referral From Nurse  Consult/Referral To Chaplain  Spiritual Encounters  Spiritual Needs Emotional;Grief support;Prayer  Stress Factors  Patient Stress Factors Exhausted;Health changes;Major life changes  Family Stress Factors Exhausted;Loss of control;Major life changes

## 2018-11-30 ENCOUNTER — Inpatient Hospital Stay: Payer: Medicare Other

## 2018-11-30 ENCOUNTER — Inpatient Hospital Stay: Payer: Medicare Other | Admitting: Anesthesiology

## 2018-11-30 ENCOUNTER — Encounter
Admission: EM | Disposition: A | Discharge status: Home / Self Care | Payer: Self-pay | Source: Home / Self Care | Attending: Internal Medicine

## 2018-11-30 ENCOUNTER — Inpatient Hospital Stay (HOSPITAL_COMMUNITY)
Admit: 2018-11-30 | Discharge: 2018-11-30 | Disposition: A | Discharge status: Home / Self Care | Payer: Medicare Other | Attending: Cardiovascular Disease | Admitting: Cardiovascular Disease

## 2018-11-30 DIAGNOSIS — N183 Chronic kidney disease, stage 3 (moderate): Secondary | ICD-10-CM

## 2018-11-30 DIAGNOSIS — I361 Nonrheumatic tricuspid (valve) insufficiency: Secondary | ICD-10-CM

## 2018-11-30 DIAGNOSIS — R55 Syncope and collapse: Secondary | ICD-10-CM

## 2018-11-30 DIAGNOSIS — K922 Gastrointestinal hemorrhage, unspecified: Secondary | ICD-10-CM

## 2018-11-30 DIAGNOSIS — R079 Chest pain, unspecified: Secondary | ICD-10-CM

## 2018-11-30 HISTORY — PX: ESOPHAGOGASTRODUODENOSCOPY (EGD) WITH PROPOFOL: SHX5813

## 2018-11-30 LAB — MAGNESIUM: Magnesium: 1.9 mg/dL (ref 1.7–2.4)

## 2018-11-30 LAB — ECHOCARDIOGRAM COMPLETE
Height: 72 in
Weight: 2812.8 oz

## 2018-11-30 LAB — BLOOD GAS, ARTERIAL
Acid-base deficit: 6 mmol/L — ABNORMAL HIGH (ref 0.0–2.0)
Bicarbonate: 17.6 mmol/L — ABNORMAL LOW (ref 20.0–28.0)
FIO2: 21
O2 Saturation: 96.3 %
Patient temperature: 37
pCO2 arterial: 29 mmHg — ABNORMAL LOW (ref 32.0–48.0)
pH, Arterial: 7.39 (ref 7.350–7.450)
pO2, Arterial: 85 mmHg (ref 83.0–108.0)

## 2018-11-30 LAB — BASIC METABOLIC PANEL
Anion gap: 7 (ref 5–15)
BUN: 84 mg/dL — ABNORMAL HIGH (ref 8–23)
CO2: 17 mmol/L — ABNORMAL LOW (ref 22–32)
Calcium: 8.4 mg/dL — ABNORMAL LOW (ref 8.9–10.3)
Chloride: 111 mmol/L (ref 98–111)
Creatinine, Ser: 2.15 mg/dL — ABNORMAL HIGH (ref 0.61–1.24)
GFR calc Af Amer: 35 mL/min — ABNORMAL LOW (ref >60)
GFR calc non Af Amer: 31 mL/min — ABNORMAL LOW (ref >60)
Glucose, Bld: 155 mg/dL — ABNORMAL HIGH (ref 70–99)
Potassium: 4.3 mmol/L (ref 3.5–5.1)
Sodium: 135 mmol/L (ref 135–145)

## 2018-11-30 LAB — HEMOGLOBIN AND HEMATOCRIT, BLOOD
HCT: 21 % — ABNORMAL LOW (ref 39.0–52.0)
HCT: 23.8 % — ABNORMAL LOW (ref 39.0–52.0)
HCT: 21.6 % — ABNORMAL LOW (ref 39.0–52.0)
Hemoglobin: 6.6 g/dL — ABNORMAL LOW (ref 13.0–17.0)
Hemoglobin: 7.8 g/dL — ABNORMAL LOW (ref 13.0–17.0)
Hemoglobin: 7.1 g/dL — ABNORMAL LOW (ref 13.0–17.0)

## 2018-11-30 LAB — GLUCOSE, CAPILLARY: Glucose-Capillary: 128 mg/dL — ABNORMAL HIGH (ref 70–99)

## 2018-11-30 LAB — CBC
HCT: 20.9 % — ABNORMAL LOW (ref 39.0–52.0)
Hemoglobin: 6.6 g/dL — ABNORMAL LOW (ref 13.0–17.0)
MCH: 33 pg (ref 26.0–34.0)
MCHC: 31.6 g/dL (ref 30.0–36.0)
MCV: 104.5 fL — ABNORMAL HIGH (ref 80.0–100.0)
Platelets: 249 10*3/uL (ref 150–400)
RBC: 2 MIL/uL — ABNORMAL LOW (ref 4.22–5.81)
RDW: 14.2 % (ref 11.5–15.5)
WBC: 13.1 10*3/uL — ABNORMAL HIGH (ref 4.0–10.5)
nRBC: 0 % (ref 0.0–0.2)

## 2018-11-30 LAB — PREPARE RBC (CROSSMATCH)

## 2018-11-30 LAB — ABO/RH: ABO/RH(D): A POS

## 2018-11-30 LAB — TROPONIN I (HIGH SENSITIVITY): Troponin I (High Sensitivity): 6 ng/L (ref <18)

## 2018-11-30 SURGERY — ESOPHAGOGASTRODUODENOSCOPY (EGD) WITH PROPOFOL
Anesthesia: General

## 2018-11-30 MED ORDER — SODIUM CHLORIDE 0.9% IV SOLUTION: Freq: Once | INTRAVENOUS | Status: DC

## 2018-11-30 MED ORDER — PANTOPRAZOLE SODIUM 40 MG IV SOLR: 40.0000 mg | Freq: Two times a day (BID) | INTRAVENOUS | Status: DC

## 2018-11-30 MED ORDER — SODIUM CHLORIDE 0.9% IV SOLUTION
Freq: Once | INTRAVENOUS | Status: AC
  Administered 2018-11-30: 03:00:00 via INTRAVENOUS

## 2018-11-30 MED ORDER — SODIUM CHLORIDE 0.9% IV SOLUTION
Freq: Once | INTRAVENOUS | Status: AC
  Administered 2018-11-30: 06:00:00 via INTRAVENOUS

## 2018-11-30 MED ORDER — SODIUM CHLORIDE 0.9 % IV SOLN
INTRAVENOUS | Status: DC
  Administered 2018-11-30 (×2): via INTRAVENOUS

## 2018-11-30 MED ORDER — SODIUM CHLORIDE 0.9 % IV SOLN
8.0000 mg/h | INTRAVENOUS | Status: DC
  Administered 2018-11-30 – 2018-12-01 (×3): 8 mg/h via INTRAVENOUS
  Filled 2018-11-30 (×4): qty 80

## 2018-11-30 MED ORDER — FENTANYL CITRATE (PF) 100 MCG/2ML IJ SOLN
INTRAMUSCULAR | Status: AC
  Filled 2018-11-30: qty 2

## 2018-11-30 MED ORDER — SODIUM CHLORIDE 0.9 % IV SOLN
INTRAVENOUS | Status: DC
  Administered 2018-11-30: 250 mL via INTRAVENOUS

## 2018-11-30 MED ORDER — DIPHENHYDRAMINE HCL 50 MG/ML IJ SOLN
25.0000 mg | Freq: Once | INTRAMUSCULAR | Status: AC
  Administered 2018-11-30: 25 mg via INTRAVENOUS
  Filled 2018-11-30: qty 1

## 2018-11-30 NOTE — Progress Notes (Signed)
*  PRELIMINARY RESULTS* Echocardiogram 2D Echocardiogram has been performed.  Shawn Lam 11/30/2018, 3:19 PM

## 2018-11-30 NOTE — H&P (Signed)
Shawn Bellows, MD [address], [place], [place], [place], [address] [address], [place], [place], [place], [address] Phone: [phone]  Fax: [phone]  Primary Care Physician:  Jinny Sanders, MD   Pre-Procedure History & Physical: HPI:  Shawn Lam is a 68 y.o. male is here for an endoscopy    Past Medical History:  Diagnosis Date  . Anemia   . Anxiety   . Asymptomatic LV dysfunction    50-55% by echo 06/2016  . Berger's disease   . CAD (coronary artery disease) 06/2005   PCI of LAD and RCA  . Depression   . Dilated aortic root (HCC)    4cm at sinus of Valsalva  . Hematuria   . Hyperlipidemia   . Hypothyroidism   . Insomnia   . Left ureteral stone   . Orthostatic hypotension    negative tilt table  . OSA (obstructive sleep apnea)    moderate with AHI 17/hr, uses CPAP nightly  . PVC's (premature ventricular contractions) 07/07/2016  . Renal disorder    PT IS SEEING DR. Lorrene Reid- NEPHROLOGIST  . Rosacea   . Urticaria     Past Surgical History:  Procedure Laterality Date  . CARDIAC CATHETERIZATION  09/06/2013   [place]  . CARDIAC CATHETERIZATION    . CORONARY ANGIOPLASTY  2004   STENT PLACEMENT  . CYSTOSCOPY WITH RETROGRADE PYELOGRAM, URETEROSCOPY AND STENT PLACEMENT Left 03/19/2014   Procedure: CYSTOSCOPY WITH RETROGRADE PYELOGRAM, URETEROSCOPY AND STENT PLACEMENT;  Surgeon: Junious Dresser, MD;  Location: [facility];  Service: Urology;  Laterality: Left;  . CYSTOSCOPY WITH RETROGRADE PYELOGRAM, URETEROSCOPY AND STENT PLACEMENT Bilateral 05/20/2015   Procedure:  CYSTOSCOPY WITH BILATERAL RETROGRADE PYELOGRAM,RIGHT  DIAGNOSTIC URETEROSCOPY ,LEFT URETEROSCOPY WITH HOLMIUM LASER  AND BILATERAL  STENT PLACEMENT ;  Surgeon: Alexis Frock, MD;  Location: [facility];  Service: Urology;  Laterality: Bilateral;  . DRUG INDUCED ENDOSCOPY N/A 10/20/2017   Procedure: DRUG INDUCED SLEEP ENDOSCOPY;  Surgeon: Melida Quitter, MD;  Location: [facility];  Service:  ENT;  Laterality: N/A;  . EP IMPLANTABLE DEVICE N/A 05/13/2016   Procedure: Loop Recorder Insertion;  Surgeon: Deboraha Sprang, MD;  Location: [facility];  Service: Cardiovascular;  Laterality: N/A;  . HOLMIUM LASER APPLICATION Bilateral 0/05/7739   Procedure: HOLMIUM LASER APPLICATION;  Surgeon: Alexis Frock, MD;  Location: [facility];  Service: Urology;  Laterality: Bilateral;  . IMPLIMENTATION OF A HYPOGLOSSAL NERVE STIMULATOR  12/08/2017   [facility]   . LEFT HEART CATHETERIZATION WITH CORONARY ANGIOGRAM N/A 09/06/2013   Procedure: LEFT HEART CATHETERIZATION WITH CORONARY ANGIOGRAM;  Surgeon: Sinclair Grooms, MD;  Location: [facility];  Service: Cardiovascular;  Laterality: N/A;  . LEFT KNEE CAP  SURGERY ABOUT 40 YRS AGO  ? 1970    . STONE EXTRACTION WITH BASKET Left 03/19/2014   Procedure: STONE EXTRACTION WITH BASKET;  Surgeon: Junious Dresser, MD;  Location: [facility];  Service: Urology;  Laterality: Left;    Prior to Admission medications   Medication Sig Start Date End Date Taking? Authorizing Provider  alendronate (FOSAMAX) 70 MG tablet Take 1 tablet (70 mg total) by mouth every 7 (seven) days. Take with a full glass of water on an empty stomach. 02/22/18  Yes Bedsole, Amy E, MD  aspirin EC 81 MG tablet Take 81 mg by mouth at bedtime.    Yes [provider]  atorvastatin (LIPITOR) 80 MG tablet Take 1 tablet (80  mg total) by mouth daily. 02/22/18  Yes Bedsole, Amy E, MD  clopidogrel (PLAVIX) 75 MG tablet TAKE 1 TABLET DAILY 11/07/18  Yes Turner, Traci R, MD  cyclobenzaprine (FLEXERIL) 10 MG tablet TAKE 1/2-1 TAB BY MOUTH AT BEDTIME AS NEEDED FOR MUSCLE SPASMS 11/01/18  Yes Bedsole, Amy E, MD  diclofenac (VOLTAREN) 75 MG EC tablet TAKE 1 TABLET BY MOUTH TWICE A DAY 11/23/18  Yes Bedsole, Amy E, MD  ferrous sulfate 325 (65 FE) MG tablet TAKE 1 TABLET BY MOUTH 3 TIMES DAILY WITH MEALS. 02/20/18  Yes Bedsole, Amy E, MD  FLUoxetine (PROZAC) 20 MG capsule TAKE 1 CAPSULE DAILY. TAKE WITH  40MG  TO EQUAL 60MG     ONCE DAILY 07/30/18  Yes Bedsole, Amy E, MD  FLUoxetine (PROZAC) 40 MG capsule TAKE 1 CAPSULE EVERY       MORNING 02/05/18  Yes Bedsole, Amy E, MD  furosemide (LASIX) 20 MG tablet TAKE 1 TABLET DAILY 04/10/18  Yes Imogene Burn, PA-C  levothyroxine (SYNTHROID, LEVOTHROID) 25 MCG tablet Take 1 tablet (25 mcg total) by mouth daily before breakfast. 03/14/18  Yes Bedsole, Amy E, MD  midodrine (PROAMATINE) 5 MG tablet TAKE 1 TABLET (5 MG TOTAL) BY MOUTH 2 (TWO) TIMES DAILY WITH BREAKFAST AND LUNCH. 03/14/18  Yes Turner, Traci R, MD  mirtazapine (REMERON) 15 MG tablet TAKE 1 TABLET BY MOUTH EVERYDAY AT BEDTIME 10/15/18  Yes Bedsole, Amy E, MD  vitamin B-12 (CYANOCOBALAMIN) 1000 MCG tablet Take 1,000 mcg by mouth daily.    Yes [provider]  zolpidem (AMBIEN) 5 MG tablet TAKE 1 TABLET BY MOUTH AT BEDTIME AS NEEDED FOR SLEEP 09/09/18  Yes [provider]  diclofenac sodium (VOLTAREN) 1 % GEL Apply 4 g topically 4 (four) times daily. Patient not taking: Reported on 11/28/2018 11/09/18   Jinny Sanders, MD  fludrocortisone (FLORINEF) 0.1 MG tablet Take 1 tablet (0.1 mg total) by mouth daily. Patient not taking: Reported on 11/28/2018 07/19/16   Imogene Burn, PA-C  Fluocinolone Acetonide Body 0.01 % OIL APPLY 1 ML ON THE SKIN TWICE A DAY 02/16/18   [provider]  hydrOXYzine (ATARAX/VISTARIL) 25 MG tablet TAKE 1 TABLET BY MOUTH AT BEDTIME MAY MAKE DROWSY 02/14/18   [provider]  nitroGLYCERIN (NITROSTAT) 0.4 MG SL tablet Place 1 tablet (0.4 mg total) under the tongue every 5 (five) minutes as needed for chest pain. 03/30/18   Elby Beck, FNP  Teriparatide, Recombinant, (FORTEO) 600 MCG/2.4ML SOLN Inject 20 mcg daily into the skin.  12/28/16   [provider]    Allergies as of 11/28/2018 - Review Complete 11/28/2018  Allergen Reaction Noted  . Codeine Nausea Only 06/05/2007  . Tape Other (See Comments) 03/19/2014    Family  History  Problem Relation Age of Onset  . Coronary artery disease Mother   . Heart attack Mother   . Heart disease Mother   . Hypertension Mother   . Cancer Father        lung and colon  . Lung cancer Father        smoker  . Diabetes Brother     Social History   Socioeconomic History  . Marital status: Married    Spouse name: Not on file  . Number of children: Not on file  . Years of education: Not on file  . Highest education level: Not on file  Occupational History  . Occupation: retired Designer, jewellery  . Emergency planning/management officer  strain: Not on file  . Food insecurity    Worry: Not on file    Inability: Not on file  . Transportation needs    Medical: Not on file    Non-medical: Not on file  Tobacco Use  . Smoking status: Never Smoker  . Smokeless tobacco: Never Used  Substance and Sexual Activity  . Alcohol use: Yes    Alcohol/week: 1.0 standard drinks    Types: 1 Cans of beer per week    Comment: Occasional beer  . Drug use: No  . Sexual activity: Yes    Partners: Female    Birth control/protection: None  Lifestyle  . Physical activity    Days per week: Not on file    Minutes per session: Not on file  . Stress: Not on file  Relationships  . Social Herbalist on phone: Not on file    Gets together: Not on file    Attends religious service: Not on file    Active member of club or organization: Not on file    Attends meetings of clubs or organizations: Not on file    Relationship status: Not on file  . Intimate partner violence    Fear of current or ex partner: Not on file    Emotionally abused: Not on file    Physically abused: Not on file    Forced sexual activity: Not on file  Other Topics Concern  . Not on file  Social History Narrative   Regular exercise--yes--jog 5 miles 6 days a week    Review of Systems: See HPI, otherwise negative ROS  Physical Exam: BP 118/70 (BP Location: Right Arm)   Pulse 99   Temp 98.7 F (37.1 C)  (Oral)   Resp 18   Ht 6' (1.829 m)   Wt 79.7 kg   SpO2 98%   BMI 23.84 kg/m  General:   Alert,  pleasant and cooperative in NAD Head:  Normocephalic and atraumatic. Neck:  Supple; no masses or thyromegaly. Lungs:  Clear throughout to auscultation, normal respiratory effort.    Heart:  +S1, +S2, Regular rate and rhythm, No edema. Abdomen:  Soft, nontender and nondistended. Normal bowel sounds, without guarding, and without rebound.   Neurologic:  Alert and  oriented x4;  grossly normal neurologically.  Impression/Plan: [name] is here for an endoscopy  to be performed for  evaluation of GI bleding.     Risks, benefits, limitations, and alternatives regarding endoscopy have been reviewed with the patient.  Questions have been answered.  All parties agreeable.   Shawn Bellows, MD  11/30/2018, 12:31 PM

## 2018-11-30 NOTE — Progress Notes (Signed)
Pt rang out complaining of " I'm getting sick again".  Pt found lying in bed shivering, skin cool and clammy.  VSS, FSBS stable.  C/o abdominal pain.  Pt had vomited into water pitcher dark brown liquid emesis with undigested food.  Dr Jannifer Franklin made aware.  Orders given.

## 2018-11-30 NOTE — Anesthesia Preprocedure Evaluation (Signed)
Anesthesia Evaluation  Patient identified by MRN, date of birth, ID band Patient awake    Reviewed: Allergy & Precautions, H&P , NPO status , Patient's Chart, lab work & pertinent test results  Airway        Dental   Pulmonary sleep apnea ,           Cardiovascular + CAD       Neuro/Psych PSYCHIATRIC DISORDERS Anxiety Depression negative neurological ROS     GI/Hepatic Neg liver ROS, GERD  Controlled,  Endo/Other  Hypothyroidism   Renal/GU CRFRenal disease  negative genitourinary   Musculoskeletal   Abdominal   Peds  Hematology  (+) Blood dyscrasia, anemia ,   Anesthesia Other Findings Past Medical History: No date: Anemia No date: Anxiety No date: Asymptomatic LV dysfunction     Comment:  50-55% by echo 06/2016 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 Lorrene Reid- NEPHROLOGIST No date: Rosacea No date: Urticaria  Past Surgical History: 09/06/2013: CARDIAC CATHETERIZATION     Comment:  [place] No date: CARDIAC CATHETERIZATION 2004: CORONARY ANGIOPLASTY     Comment:  STENT PLACEMENT 03/19/2014: CYSTOSCOPY WITH RETROGRADE PYELOGRAM, URETEROSCOPY AND  STENT PLACEMENT; Left     Comment:  Procedure: [place],               URETEROSCOPY AND STENT PLACEMENT;  Surgeon: Junious Dresser, MD;  Location: [facility];  Service: Urology;                 Laterality: Left; 05/20/2015: CYSTOSCOPY WITH RETROGRADE PYELOGRAM, URETEROSCOPY AND  STENT PLACEMENT; Bilateral     Comment:  Procedure:  CYSTOSCOPY WITH BILATERAL RETROGRADE               PYELOGRAM,RIGHT  DIAGNOSTIC URETEROSCOPY ,LEFT               URETEROSCOPY WITH HOLMIUM LASER  AND BILATERAL  STENT               PLACEMENT ;  Surgeon: Alexis Frock, MD;  [facility];  Service: Urology;  Laterality: Bilateral; 10/20/2017: DRUG INDUCED ENDOSCOPY; N/A     Comment:  Procedure: DRUG INDUCED SLEEP ENDOSCOPY;  Surgeon:               Melida Quitter, MD;  Location: [facility];               Service: ENT;  Laterality: N/A; 05/13/2016: EP IMPLANTABLE DEVICE; N/A     Comment:  Procedure: Loop Recorder Insertion;  Surgeon: Deboraha Sprang, MD;  Location: [facility];  Service:  Cardiovascular;  Laterality: N/A; 05/20/2015: HOLMIUM LASER APPLICATION; Bilateral     Comment:  Procedure: HOLMIUM LASER APPLICATION;  Surgeon: Alexis Frock, MD;  Location: [facility];  Service: Urology;                Laterality: Bilateral; 12/08/2017: IMPLIMENTATION OF A HYPOGLOSSAL NERVE STIMULATOR     Comment:  OSA  09/06/2013: LEFT HEART CATHETERIZATION WITH CORONARY ANGIOGRAM; N/A     Comment:  Procedure: LEFT HEART CATHETERIZATION WITH CORONARY               ANGIOGRAM;  Surgeon: Sinclair Grooms, MD;  Location: [facility]              CATH [facility];  Service: Cardiovascular;  Laterality: N/A; No date: LEFT KNEE CAP  SURGERY ABOUT 40 YRS AGO  ? 1970 03/19/2014: STONE EXTRACTION WITH BASKET; Left     Comment:  Procedure: STONE EXTRACTION WITH BASKET;  Surgeon: Junious Dresser, MD;  Location: [facility];  Service:               Urology;  Laterality: Left;  BMI    Body Mass Index: 23.84 kg/m      Reproductive/Obstetrics negative OB ROS                             Anesthesia Physical Anesthesia Plan  ASA:    Anesthesia Plan: General   Post-op Pain Management:    Induction:   PONV Risk Score and Plan: Propofol infusion and TIVA  Airway Management Planned:   Additional Equipment:   Intra-op Plan:   Post-operative Plan:   Informed Consent: I have reviewed the patients History and Physical, chart, labs and discussed the procedure including the risks, benefits and alternatives for the proposed anesthesia with the patient or authorized representative who has indicated his/her understanding and acceptance.     Dental Advisory Given  Plan Discussed with: Anesthesiologist, CRNA and Surgeon  Anesthesia Plan Comments:         Anesthesia Quick Evaluation

## 2018-11-30 NOTE — Progress Notes (Signed)
Progress Note  Patient Name: Shawn Lam Date of Encounter: 11/30/2018  Primary Cardiologist: Fransico Him, MD  Subjective   Continues to deny chest pain. Occasional palpitations. No SOB. No racing HR.   Mild abdominal discomfort, which is new for him. Also mildly nauseated, which he attributed to NPO status.   Feels weak / fatigued.   Reports he had another episode of feeling as if he may pass out last night. He woke up around 3AM and reportedly had an episode of hemoptysis (Hgb 8.2  6.6) then received transfusion. Recommendations as below.  Reported that SCDs placed but never turned on, which is now resolved.   Inpatient Medications    Scheduled Meds: . atorvastatin  80 mg Oral Daily  . ferrous sulfate  325 mg Oral Q breakfast  . fludrocortisone  0.1 mg Oral Daily  . FLUoxetine  60 mg Oral q morning - 10a  . levothyroxine  25 mcg Oral Q0600  . midodrine  5 mg Oral BID WC  . mirtazapine  15 mg Oral QHS  . [START ON 12/03/2018] pantoprazole  40 mg Intravenous Q12H  . sodium chloride flush  3 mL Intravenous Q12H  . vitamin B-12  1,000 mcg Oral Daily   Continuous Infusions: . sodium chloride Stopped (11/30/18 0242)  . pantoprozole (PROTONIX) infusion 8 mg/hr (11/30/18 0600)   PRN Meds: sodium chloride, acetaminophen **OR** acetaminophen, HYDROcodone-acetaminophen, hydrOXYzine, nitroGLYCERIN, ondansetron **OR** ondansetron (ZOFRAN) IV, sodium chloride flush, zolpidem   Vital Signs    Vitals:   11/30/18 0702 11/30/18 0704 11/30/18 0802 11/30/18 0803  BP: 131/68 131/68 (!) 133/59 114/75  Pulse: 89 89 94 96  Resp:  17  18  Temp: 98.2 F (36.8 C) 98.2 F (36.8 C)  98.3 F (36.8 C)  TempSrc: Oral Oral  Oral  SpO2: 100%  100% 100%  Weight:      Height:        Intake/Output Summary (Last 24 hours) at 11/30/2018 0818 Last data filed at 11/30/2018 0631 Gross per 24 hour  Intake 1287.72 ml  Output 725 ml  Net 562.72 ml   Last 3 Weights 11/30/2018 11/29/2018  11/28/2018  Weight (lbs) 175 lb 12.8 oz 176 lb 9.6 oz 176 lb 9.6 oz  Weight (kg) 79.742 kg 80.105 kg 80.105 kg  Some encounter information is confidential and restricted. Go to Review Flowsheets activity to see all data.      Telemetry    SR-ST, 80s-low 100s - Personally Reviewed  ECG    No new tracings - Personally Reviewed  Physical Exam   GEN: No acute distress. Fatigued but pleasant. Neck: No JVD  Cardiac: RRR, no murmurs, rubs, or gallops.  Respiratory: Clear to auscultation bilaterally.  GI: Soft, nontender, non-distended  MS: SCDs (now turned on). No ankle / pedal edema; No deformity. Neuro:  Nonfocal  Psych: Normal affect   Labs    High Sensitivity Troponin:   Recent Labs  Lab 11/28/18 0903 11/28/18 1035 11/30/18 0107  TROPONINIHS 5 6 6       Cardiac EnzymesNo results for input(s): TROPONINI in the last 168 hours. No results for input(s): TROPIPOC in the last 168 hours.   Chemistry Recent Labs  Lab 11/28/18 0903 11/29/18 0655 11/30/18 0107  NA 135 138 135  K 4.9 4.2 4.3  CL 105 111 111  CO2 21* 22 17*  GLUCOSE 155* 105* 155*  BUN 62* 67* 84*  CREATININE 1.55* 1.70* 2.15*  CALCIUM 9.2 8.4* 8.4*  GFRNONAA 45* 41*  31*  GFRAA 53* 47* 35*  ANIONGAP 9 5 7      Hematology Recent Labs  Lab 11/28/18 0903 11/29/18 0655 11/29/18 1241 11/29/18 1500 11/30/18 0107  WBC 8.2 7.1  --   --  13.1*  RBC 3.44* 2.64*  --   --  2.00*  HGB 11.2* 8.5* 8.8* 8.2* 6.6*  6.6*  HCT 34.7* 27.0* 27.9* 26.1* 20.9*  21.0*  MCV 100.9* 102.3*  --   --  104.5*  MCH 32.6 32.2  --   --  33.0  MCHC 32.3 31.5  --   --  31.6  RDW 14.1 14.3  --   --  14.2  PLT 252 216  --   --  249    BNPNo results for input(s): BNP, PROBNP in the last 168 hours.   DDimer No results for input(s): DDIMER in the last 168 hours.   Radiology    Dg Chest 2 View  Result Date: 11/29/2018 CLINICAL DATA:  Syncope today. Code blue called, pt unresponsive, cardiac arrest. Pt states concern for  internal bleeding. Hx of CAD. Nonsmoker. EXAM: CHEST - 2 VIEW COMPARISON:  11/28/2018 FINDINGS: Lungs are clear. Heart size normal. Monitoring device overlies the left hemithorax. Generator device projects over the right upper chest as before. No effusion. T12 compression deformity, post cement augmentation. IMPRESSION: No acute cardiopulmonary disease. Electronically Signed   By: Lucrezia Europe M.D.   On: 11/29/2018 17:00   Ct Head Wo Contrast  Result Date: 11/29/2018 CLINICAL DATA:  Encephalopathy. Syncopal episode in the emergency room. Negative stress test this morning. EXAM: CT HEAD WITHOUT CONTRAST TECHNIQUE: Contiguous axial images were obtained from the base of the skull through the vertex without intravenous contrast. COMPARISON:  None. FINDINGS: Brain: Ventricles, cisterns and other CSF spaces are normal. There is no mass, mass effect, shift of midline structures or acute hemorrhage. No evidence of acute infarction. Vascular: No hyperdense vessel or unexpected calcification. Skull: Normal. Negative for fracture or focal lesion. Sinuses/Orbits: Orbits are normal. Paranasal sinuses are well developed as there is small air-fluid level over the left sphenoid sinus. Mastoid air cells are clear. Other: None. IMPRESSION: No acute findings. Subtle air-fluid level over the left sphenoid sinus. Electronically Signed   By: Marin Olp M.D.   On: 11/29/2018 16:48   Ct Angio Chest Pe W And/or Wo Contrast  Result Date: 11/28/2018 CLINICAL DATA:  Chest pressure, shortness of breath EXAM: CT ANGIOGRAPHY CHEST WITH CONTRAST TECHNIQUE: Multidetector CT imaging of the chest was performed using the standard protocol during bolus administration of intravenous contrast. Multiplanar CT image reconstructions and MIPs were obtained to evaluate the vascular anatomy. CONTRAST:  68mL OMNIPAQUE IOHEXOL 350 MG/ML SOLN COMPARISON:  06/20/2013, 02/09/2018 FINDINGS: Cardiovascular: Satisfactory opacification of the pulmonary  arteries to the segmental level. No evidence of pulmonary embolism. Normal heart size. No pericardial effusion. Mediastinum/Nodes: No enlarged mediastinal, hilar, or axillary lymph nodes. Thyroid gland, trachea, and esophagus demonstrate no significant findings. Lungs/Pleura: Chronic posterior biapical pleuroparenchymal scarring. Lungs are otherwise clear. No focal airspace consolidation, pleural effusion, or pneumothorax. Upper Abdomen: No acute abnormality. Musculoskeletal: Right chest wall stimulator. Left chest wall loop recorder. Chronic compression deformity T12 status post cement augmentation. Additional chronic mild superior endplate compression deformities of T7, T8, and T9, which were apparent on prior x-ray 02/09/2018. No new or acute osseous abnormality identified. Review of the MIP images confirms the above findings. IMPRESSION: 1. Negative for PE.  Lungs are clear. 2. Multiple chronic thoracic spine vertebral body  compression deformities, not significantly changed in appearance compared to 02/09/2018. Electronically Signed   By: Davina Poke M.D.   On: 11/28/2018 10:28   Nm Myocar Multi W/spect W/wall Motion / Ef  Result Date: 11/29/2018  There was no ST segment deviation noted during stress.  No T wave inversion was noted during stress.  The study is normal.  This is a low risk study.  The left ventricular ejection fraction is normal (55-65%).    Dg Chest Port 1 View  Result Date: 11/28/2018 CLINICAL DATA:  Chest pain. EXAM: PORTABLE CHEST 1 VIEW COMPARISON:  Radiographs of December 08, 2017. FINDINGS: The heart size and mediastinal contours are within normal limits. Both lungs are clear. No pneumothorax or pleural effusion is noted. Stable position of stimulator seen over right chest. The visualized skeletal structures are unremarkable. IMPRESSION: No active disease. Electronically Signed   By: Marijo Conception M.D.   On: 11/28/2018 08:35   Dg Abd Portable 1v  Result Date: 11/30/2018  CLINICAL DATA:  Abdominal pain and vomiting. EXAM: PORTABLE ABDOMEN - 1 VIEW COMPARISON:  Abdominal x-ray 08/07/2018 FINDINGS: No bowel dilatation to suggest obstruction. Small to moderate colonic stool burden. No evidence of free air. No radiopaque calculi or abnormal soft tissue calcifications. Overlying monitoring devices. Chronic compression fractures in the lower thoracic and upper lumbar spine. IMPRESSION: Normal bowel gas pattern. Small to moderate colonic stool burden. Electronically Signed   By: Keith Rake M.D.   On: 11/30/2018 02:18    Cardiac Studies   11/30/2018 Myocardial perfusion is normal.   The study is normal.   This is a low risk study.  Overall left ventricular systolic function was normal.    LV cavity size is normal.  The left ventricular ejection fraction is normal (55-65%).     07/05/2017 Echo Left ventricle: The cavity size was normal. Wall thickness was increased in a pattern of mild LVH. Systolic function was normal. The estimated ejection fraction was in the range of 60% to 65%. Wall motion was normal; there were no regional wall motion abnormalities. Doppler parameters are consistent with abnormal left ventricular relaxation (grade 1 diastolic dysfunction). The E/e&' ratio is <8, suggesting normal LV filling pressure. - Aorta: The sinus of valsalva is dilated at 4.0 cm. - Left atrium: The atrium was normal in size. - Right atrium: The atrium was at the upper limits of normal in size. - Tricuspid valve: There was trivial regurgitation. - Pulmonary arteries: PA peak pressure: 22 mm Hg (S). - Inferior vena cava: The vessel was normal in size. The respirophasic diameter changes were in the normal range (= 50%), consistent with normal central venous pressure.  Impressions:  - Compared to a prior study in 06/2016, the LVEF is higher at 60-65%. The aorta is dilated to 4.0 cm at the sinus of valsalva, but measures 3.5 cm at the aortic  root.  LHC  08/2013 The left main coronary artery is normal.  The left anterior descending artery is widely patent. The proximal LAD stent contains distal margin 30% narrowing due to ISR.[name]  The left circumflex artery is widely patent.  The right coronary artery is widely patent. The mid right coronary stent is without evidence of ISR.   Patient Profile     68 y.o. male hx of CAD s/p PCI to LAD and RCA (2007), 2015 cath showing pLAD stent with distal margin 30% narrowing due to ISR, 2019 echo with mildly dilated aortic root, orthostatic hypertension and palpitations s/p  Medtronic LINQ Loop insertion (Serial # [id number]) 05/13/2016, HLD, macrocytic anemia with past suspicion for hematuria, recent melena FOBT negative, h/o epistaxis, hypothyroidism, Berger's dz with known CKD, history of lymphedema (lymphedema clinic in [place]), and moderate OSA s/p inserted inspire device 11/2017 due to CPAP intolerance and who is being seen today for the evaluation of chest pain.  Assessment & Plan    Atypical Chest pain  H/o CAD and PCI to LAD and RCA --No current chest pain. EKG without acute changes. Tn negative. Most recent Medtronic download negative for atrial fibrillation as below. History of CAD s/p PCI to LAD and RCA (2007); 2015 pLAD stent with distal margin 30% narrowing due to ISR. 2019 echo no RWMA, EF 60-65%. --Stress test 8/20 low risk. Hgb drop with GI consulted as below. CP workup and further symptoms described more consistent with noncardiac etiology. Suspect that symptoms due to dropping Hgb/ occult bleed.  --No indication for cath or further ischemic workup. --Continue medical management with statin therapy. Continue to hold ASA and Plavix. No antiplatelet / anticoagulation. SCDs for DVT prophylaxis. No BB given orthostatic hypotension.  Blood loss anemia, H/o Macrocytic Anemia --Hgb drop 11.2  8.5  6.2. Daily CBC. --FOBT positive, hemoptysis overnight.  --S/p transfusion  overnight.  --Repeat Hgb ordered s/p transfusion, pending. --Per GI, if Hgb over 7.0 and s/p transfusion, will scope today.  --Based on ACC/AHA guidelines, the patient would be at acceptable risk for any GI procedure without further cardiovascular testing as previously noted. --Further workup per GI.  Orthostatic hypotension, s/p Loop insertion in 2018 --S/p Medtronic LINQ Loop insertion (Serial # [id number]) 05/13/2016 with recent remote device check performed by Dr. Caryl Comes 10/23/2018 without atrial fibrillation. --Continue to monitor on telemetry. --BP currently stable.  HTN --BP stable. Continue to monitor. Labile pressures. Not on PTA BB with h/o orthostatic hypotension and syncope.  CKD, Berger's Disease --Daily BMET. Consider also as possible etiology of Hgb drop.  OSA --S/p inspirehypoglossal nerve stimulator 12/08/2017 for OSAwith poor CPAP tolerance.  HLD --Continue statin  For questions or updates, please contact [place] Please consult [email or web address] for contact info under        Signed, Arvil Chaco, PA-C  11/30/2018, 8:18 AM

## 2018-11-30 NOTE — Progress Notes (Signed)
[facility] Summary Report / Loop Recorder 

## 2018-11-30 NOTE — Progress Notes (Signed)
   Patient evaluated in endoscopy suite and stable from a cardiac standpoint. Oxygen saturation 99% on ear and 80s on finger/hand. Echocardiogram requested and will update.   Signed, Arvil Chaco, PA-C 11/30/2018, 2:08 PM Pager [phone]

## 2018-11-30 NOTE — Progress Notes (Signed)
[place] [facility] [place] NAME: Shawn Lam    MR#:  [id number]  DATE OF BIRTH:  [birth year]  SUBJECTIVE:  CHIEF COMPLAINT:   Chief Complaint  Patient presents with  . Chest Pain   Patient initially admitted with chest pain.  Stress test done was negative.  Subsequently noted to have drop in hemoglobin with evidence of GI bleed.  Had a syncopal episode while in the bathroom resulting in [place] being called but no CPR was done. EGD scheduled for today had to be canceled due to episode of hypoxia.  I went to evaluate patient immediately in the GI lab and oxygen saturation was 95 to 97% on room air at the time of my evaluation.  Stat ABG done with no evidence of hypoxia with PO2 of 85 on room air.  Chest x-ray negative.   REVIEW OF SYSTEMS:  ROS CONSTITUTIONAL: No fever, fatigue or weakness.  EYES: No blurred or double vision.  EARS, NOSE, AND THROAT: No tinnitus or ear pain.  RESPIRATORY: No cough, shortness of breath, wheezing or hemoptysis.  CARDIOVASCULAR: Positive chest pain, orthopnea, edema.  GASTROINTESTINAL: No nausea, vomiting, diarrhea or abdominal pain.  GENITOURINARY: No dysuria, hematuria.  ENDOCRINE: No polyuria, nocturia,  HEMATOLOGY: No anemia, easy bruising or bleeding.  Positive for dark stools SKIN: No rash or lesion. MUSCULOSKELETAL: No joint pain or arthritis.   NEUROLOGIC: No tingling, numbness, weakness.  PSYCHIATRY: No anxiety or depression.  DRUG ALLERGIES:   Allergies  Allergen Reactions  . Codeine Nausea Only  . Tape Other (See Comments)    Use only paper tape. Severe blistering and bruising with other tapes. No cloth tape.   VITALS:  Blood pressure 131/70, pulse (!) 103, temperature 98.6 F (37 C), temperature source Oral, resp. rate 18, height 6' (1.829 m), weight 79.7 kg, SpO2 100 %. PHYSICAL EXAMINATION:  Physical Exam  GENERAL:  68 y.o.-year-old patient lying in the bed with no acute distress.   EYES: Pupils equal, round, reactive to light and accommodation. No scleral icterus. Extraocular muscles intact.  HEENT: Head atraumatic, normocephalic. Oropharynx and nasopharynx clear.  NECK:  Supple, no jugular venous distention. No thyroid enlargement, no tenderness.  LUNGS: Normal breath sounds bilaterally, no wheezing, rales,rhonchi or crepitation. No use of accessory muscles of respiration.  CARDIOVASCULAR: S1, S2 normal. No murmurs, rubs, or gallops.  ABDOMEN: Soft, nontender, nondistended. Bowel sounds present. No organomegaly or mass.  EXTREMITIES: No pedal edema, cyanosis, or clubbing.  NEUROLOGIC: Cranial nerves II through XII are intact. Muscle strength 5/5 in all extremities. Sensation intact. Gait not checked.  PSYCHIATRIC: The patient is alert and oriented x 3.  SKIN: No obvious rash, lesion, or ulcer.   LABORATORY PANEL:  Male CBC Recent Labs  Lab 11/30/18 0107 11/30/18 1102  WBC 13.1*  --   HGB 6.6*  6.6* 7.8*  HCT 20.9*  21.0* 23.8*  PLT 249  --    ------------------------------------------------------------------------------------------------------------------ Chemistries  Recent Labs  Lab 11/30/18 0107  NA 135  K 4.3  CL 111  CO2 17*  GLUCOSE 155*  BUN 84*  CREATININE 2.15*  CALCIUM 8.4*  MG 1.9   RADIOLOGY:  Dg Chest 1 View  Result Date: 11/30/2018 CLINICAL DATA:  68 year old male with a history dyspnea EXAM: CHEST  1 VIEW COMPARISON:  November 29, 2018, CT chest November 28, 2018 FINDINGS: Cardiomediastinal silhouette unchanged in size and contour. Unchanged position of right chest wall vagal nerve stimulator and the event recorder  on the left chest wall. No pneumothorax. No pleural effusion. No confluent airspace disease. Coarsened interstitial markings. IMPRESSION: Negative for acute cardiopulmonary disease with background of chronic changes. Electronically Signed   By: Corrie Mckusick D.O.   On: 11/30/2018 14:17   Dg Chest 2 View  Result Date:  11/29/2018 CLINICAL DATA:  Syncope today. Code blue called, pt unresponsive, cardiac arrest. Pt states concern for internal bleeding. Hx of CAD. Nonsmoker. EXAM: CHEST - 2 VIEW COMPARISON:  11/28/2018 FINDINGS: Lungs are clear. Heart size normal. Monitoring device overlies the left hemithorax. Generator device projects over the right upper chest as before. No effusion. T12 compression deformity, post cement augmentation. IMPRESSION: No acute cardiopulmonary disease. Electronically Signed   By: Lucrezia Europe M.D.   On: 11/29/2018 17:00   Ct Head Wo Contrast  Result Date: 11/29/2018 CLINICAL DATA:  Encephalopathy. Syncopal episode in the emergency room. Negative stress test this morning. EXAM: CT HEAD WITHOUT CONTRAST TECHNIQUE: Contiguous axial images were obtained from the base of the skull through the vertex without intravenous contrast. COMPARISON:  None. FINDINGS: Brain: Ventricles, cisterns and other CSF spaces are normal. There is no mass, mass effect, shift of midline structures or acute hemorrhage. No evidence of acute infarction. Vascular: No hyperdense vessel or unexpected calcification. Skull: Normal. Negative for fracture or focal lesion. Sinuses/Orbits: Orbits are normal. Paranasal sinuses are well developed as there is small air-fluid level over the left sphenoid sinus. Mastoid air cells are clear. Other: None. IMPRESSION: No acute findings. Subtle air-fluid level over the left sphenoid sinus. Electronically Signed   By: Marin Olp M.D.   On: 11/29/2018 16:48   Dg Abd Portable 1v  Result Date: 11/30/2018 CLINICAL DATA:  Abdominal pain and vomiting. EXAM: PORTABLE ABDOMEN - 1 VIEW COMPARISON:  Abdominal x-ray 08/07/2018 FINDINGS: No bowel dilatation to suggest obstruction. Small to moderate colonic stool burden. No evidence of free air. No radiopaque calculi or abnormal soft tissue calcifications. Overlying monitoring devices. Chronic compression fractures in the lower thoracic and upper lumbar  spine. IMPRESSION: Normal bowel gas pattern. Small to moderate colonic stool burden. Electronically Signed   By: Keith Rake M.D.   On: 11/30/2018 02:18   ASSESSMENT AND PLAN:   1.  Chest pain with history of coronary artery disease Stress test done was negative. 2D echocardiogram done today.  Follow-up on report when read. CTA negative for PE on admission 2 days ago.   2.  Hyperlipidemia continue Lipitor  3.  Chronic orthostatic hypotension continue midodrine  4.  Hypothyroidism continue Synthroid  5.  Depression continue Prozac  6.  Insomnia continue home medication  7.  Chronic kidney disease renal function stable  8.    Syncope. Patient had a syncopal episode while in the bathroom having a bowel movement.  CODE BLUE was called but patient spontaneously became more awake.  No CPR or medications were administered. This is most likely vasovagal possibly related to suspected GI bleed Follow-up on 2D echocardiogram report when read.  9.  Acute blood loss anemia. Secondary to GI bleed. Very dark stools noted. Seen by gastroenterologist.  Started on IV Protonix.  Transfused with 1 unit of packed red blood cells overnight with improvement in hemoglobin from 6.6-7.8. Aspirin and Plavix previously discontinued. EGD was canceled today due to report of patient being hypoxic while in the GI lab. I went to evaluate patient immediately and at the time of my evaluation patient was now 95 to 97% on room air.  No shortness of  breath.  No chest pain. Patient had CTA done 2 days ago which was negative for PE.  Had stress test done yesterday which was negative.  Unable to do VQ scan due to stress test yesterday.  Clinical probably T for PE low at this time. Stat ABG done with PO2 of 85% on room air.  Chest x-ray negative. Gastroenterologist plans to attempt EGD tomorrow  DVT prophylaxis; SCDs Lovenox previously discontinued due to GI bleed  All the records are reviewed and case  discussed with Care Management/Social Worker. Management plans discussed with the patient, family and they are in agreement.  CODE STATUS: Full Code  TOTAL TIME TAKING CARE OF THIS PATIENT: 40 minutes.   More than 50% of the time was spent in counseling/coordination of care: YES  POSSIBLE D/C IN 2 DAYS, DEPENDING ON CLINICAL CONDITION.   Estephan Gallardo M.D on 11/30/2018 at 3:24 PM  Between 7am to 6pm - Pager - [phone]  After 6pm go to [email or web address] - Proofreader  [facility]  Office  [phone]  CC: Primary care physician; Jinny Sanders, MD  Note: This dictation was prepared with Dragon dictation along with smaller phrase technology. Any transcriptional errors that result from this process are unintentional.

## 2018-11-30 NOTE — Progress Notes (Addendum)
   8/20 NM myocardial perfusion study / Lexiscan stress test ruled low risk. Ruled acceptable risk for any GI procedure without further cardiovascular testing.   Further workup requested by anesthesia today. Echocardiogram 8/21 with EF normal 60-65%. No regional wall motion abnormalities. Normal RV and LV systolic function.   Based on ACC/AHA guidelines, the patient continues to be acceptable risk from cardiac standpoint for any GI procedure.  Hgb > 7.0 required to proceed with endoscopy procedure. Recheck Hgb after transfusions to ensure above 7.0.  Signed, Arvil Chaco, PA-C 11/30/2018, 7:49 PM Pager [phone]

## 2018-11-30 NOTE — Progress Notes (Signed)
Patient care taken over by this RN. Patient resting in bed without complaints. Wife at bedside. Call bell in reach.

## 2018-11-30 NOTE — Progress Notes (Signed)
Report given to Endoscopy RN, Jocelyn Lamer- still awaiting the results of H&H- and then Endoscopy will decide how to proceed

## 2018-11-30 NOTE — Progress Notes (Signed)
We have not taken the patient to the endoscopy room to commence the upper endoscopy.  He was found to have saturations in the low 80s and heart rate in the 100s.  He appeared to be comfortable not complaining any shortness of breath no peripheral cyanosis.  Subsequently his saturations dropped to the high 70s despite being on oxygen and hence it was felt that it was not safe to proceed with the procedure as he is not profusely bleeding at this point of time.  Informed Dr. Stark Jock who will evaluate the patient.  I think he should be reassessed with the background of her recent syncopal episode as well as hypoxia?  Rule out pulmonary embolism.  If stable we can plan for upper endoscopy to be done tomorrow.  If profusely bleeding then we could consider either a tagged RBC scan or a CT angiogram or reconsider upper endoscopy.  Dr Jonathon Bellows MD,MRCP [facility]) Gastroenterology/Hepatology Pager: [phone]

## 2018-11-30 NOTE — Progress Notes (Signed)
Dr Jannifer Franklin made aware of lab results.  Orders given.

## 2018-11-30 NOTE — Transfer of Care (Signed)
Immediate Anesthesia Transfer of Care Note  Patient: Shawn Lam  Procedure(s) Performed: ESOPHAGOGASTRODUODENOSCOPY (EGD) WITH PROPOFOL (N/A )  Patient Location: PACU  Anesthesia Type:General  Level of Consciousness: awake, alert  and oriented  Airway & Oxygen Therapy: Patient Spontanous Breathing and Patient connected to face mask oxygen  Post-op Assessment: Report given to RN and Post -op Vital signs reviewed and stable  Post vital signs: Reviewed and stable  Last Vitals:  Vitals Value Taken Time  BP 131/70 11/30/18 1414  Temp 37 C 11/30/18 1414  Pulse 103 11/30/18 1414  Resp 18 11/30/18 1414  SpO2 100 % 11/30/18 1414    Last Pain:  Vitals:   11/30/18 1414  TempSrc: Oral  PainSc:       Patients Stated Pain Goal: 0 (29/51/88 4166)  Complications: No apparent anesthesia complications

## 2018-11-30 NOTE — Progress Notes (Signed)
Shawn Lam , MD [address], [place], [place], [place], [address] [address], [place], [place], [place], [address] Phone: [phone]  Fax: [phone]   DEAGLAN LILE is being followed for upper GI bleed day 1 of follow up   Subjective: Had a further episode of hematemesis, large quantity melena and drop in hemoglobin   Objective: Vital signs in last 24 hours: Vitals:   11/30/18 0704 11/30/18 0802 11/30/18 0803 11/30/18 0922  BP: 131/68 (!) 133/59 114/75 118/70  Pulse: 89 94 96 99  Resp: 17  18 18   Temp: 98.2 F (36.8 C)  98.3 F (36.8 C) 98.7 F (37.1 C)  TempSrc: Oral  Oral Oral  SpO2:  100% 100% 98%  Weight:      Height:       Weight change: -1.258 kg  Intake/Output Summary (Last 24 hours) at 11/30/2018 1232 Last data filed at 11/30/2018 1025 Gross per 24 hour  Intake 1589.72 ml  Output 1025 ml  Net 564.72 ml     Exam: Heart:: Regular rate and rhythm, S1S2 present or without murmur or extra heart sounds Lungs: normal, clear to auscultation and clear to auscultation and percussion Abdomen: soft, nontender, normal bowel sounds   Lab Results: @LABTEST2 @ Micro Results: Recent Results (from the past 240 hour(s))  SARS CORONAVIRUS 2 Nasal Swab Aptima Multi Swab     Status: None   Collection Time: 11/28/18  1:22 PM   Specimen: Aptima Multi Swab; Nasal Swab  Result Value Ref Range Status   SARS Coronavirus 2 NEGATIVE NEGATIVE Final    Comment: (NOTE) SARS-CoV-2 target nucleic acids are NOT DETECTED. The SARS-CoV-2 RNA is generally detectable in upper and lower respiratory specimens during the acute phase of infection. Negative results do not preclude SARS-CoV-2 infection, do not rule out co-infections with other pathogens, and should not be used as the sole basis for treatment or other patient management decisions. Negative results must be combined with clinical observations, patient history, and epidemiological information. The expected result  is Negative. Fact Sheet for Patients: SugarRoll.be Fact Sheet for Healthcare Providers: https://www.woods-mathews.com/ This test is not yet approved or cleared by the [place] FDA and  has been authorized for detection and/or diagnosis of SARS-CoV-2 by FDA under an Emergency Use Authorization (EUA). This EUA will remain  in effect (meaning this test can be used) for the duration of the COVID-19 declaration under Section 56 4(b)(1) of the Act, 21 U.S.C. section 360bbb-3(b)(1), unless the authorization is terminated or revoked sooner. Performed at [facility], [place] [address] [place], [place] [address]    Studies/Results: Dg Chest 2 View  Result Date: 11/29/2018 CLINICAL DATA:  Syncope today. Code blue called, pt unresponsive, cardiac arrest. Pt states concern for internal bleeding. Hx of CAD. Nonsmoker. EXAM: CHEST - 2 VIEW COMPARISON:  11/28/2018 FINDINGS: Lungs are clear. Heart size normal. Monitoring device overlies the left hemithorax. Generator device projects over the right upper chest as before. No effusion. T12 compression deformity, post cement augmentation. IMPRESSION: No acute cardiopulmonary disease. Electronically Signed   By: Lucrezia Europe M.D.   On: 11/29/2018 17:00   Ct Head Wo Contrast  Result Date: 11/29/2018 CLINICAL DATA:  Encephalopathy. Syncopal episode in the emergency room. Negative stress test this morning. EXAM: CT HEAD WITHOUT CONTRAST TECHNIQUE: Contiguous axial images were obtained from the base of the skull through the vertex without intravenous contrast. COMPARISON:  None. FINDINGS: Brain: Ventricles, cisterns and other CSF spaces are normal. There is  no mass, mass effect, shift of midline structures or acute hemorrhage. No evidence of acute infarction. Vascular: No hyperdense vessel or unexpected calcification. Skull: Normal. Negative for fracture or focal lesion. Sinuses/Orbits: Orbits are normal. Paranasal  sinuses are well developed as there is small air-fluid level over the left sphenoid sinus. Mastoid air cells are clear. Other: None. IMPRESSION: No acute findings. Subtle air-fluid level over the left sphenoid sinus. Electronically Signed   By: Marin Olp M.D.   On: 11/29/2018 16:48   Nm Myocar Multi W/spect W/wall Motion / Ef  Result Date: 11/29/2018  There was no ST segment deviation noted during stress.  No T wave inversion was noted during stress.  The study is normal.  This is a low risk study.  The left ventricular ejection fraction is normal (55-65%).    Dg Abd Portable 1v  Result Date: 11/30/2018 CLINICAL DATA:  Abdominal pain and vomiting. EXAM: PORTABLE ABDOMEN - 1 VIEW COMPARISON:  Abdominal x-ray 08/07/2018 FINDINGS: No bowel dilatation to suggest obstruction. Small to moderate colonic stool burden. No evidence of free air. No radiopaque calculi or abnormal soft tissue calcifications. Overlying monitoring devices. Chronic compression fractures in the lower thoracic and upper lumbar spine. IMPRESSION: Normal bowel gas pattern. Small to moderate colonic stool burden. Electronically Signed   By: Keith Rake M.D.   On: 11/30/2018 02:18   Medications: I have reviewed the patient's current medications. Scheduled Meds:  [MAR Hold] atorvastatin  80 mg Oral Daily   [MAR Hold] ferrous sulfate  325 mg Oral Q breakfast   [MAR Hold] fludrocortisone  0.1 mg Oral Daily   [MAR Hold] FLUoxetine  60 mg Oral q morning - 10a   [MAR Hold] levothyroxine  25 mcg Oral Q0600   [MAR Hold] midodrine  5 mg Oral BID WC   [MAR Hold] mirtazapine  15 mg Oral QHS   [MAR Hold] pantoprazole  40 mg Intravenous Q12H   [MAR Hold] sodium chloride flush  3 mL Intravenous Q12H   [MAR Hold] vitamin B-12  1,000 mcg Oral Daily   Continuous Infusions:  [MAR Hold] sodium chloride Stopped (11/30/18 0242)   pantoprozole (PROTONIX) infusion 8 mg/hr (11/30/18 0600)   PRN Meds:.[MAR Hold] sodium  chloride, [MAR Hold] acetaminophen **OR** [MAR Hold] acetaminophen, [MAR Hold] HYDROcodone-acetaminophen, [MAR Hold] hydrOXYzine, [MAR Hold] nitroGLYCERIN, [MAR Hold] ondansetron **OR** [MAR Hold] ondansetron (ZOFRAN) IV, [MAR Hold] sodium chloride flush, [MAR Hold] zolpidem   Assessment: Active Problems:   Anemia   Other chest pain   Chronic kidney disease (CKD), stage III (moderate) ([place])   Chest pain  HURBERT DURAN is a 68 y.o. y/o male admitted with chest pain.  Had cardiac evaluation and had a negative stress test.  I have been consulted for a drop in hemoglobin.  Baseline around 13 g previously and dropped to 8.8 g,macrocytic in nature.  He has been on aspirin and Plavix.  Long-term NSAID use.  Elevation in the BUN/creatinine ratio suggestive of possible upper GI bleed.  Overnight further drop in hemoglobin to 6.6 g and received 1 unit PRBCs and went up to 7.8 g.  Ongoing melena.  B12 and folate normal  Plan 1.  Commence on PPI and continue.  EGD today 2.     TSH is elevated suggesting hypothyroidism and he has macrocytosis suggest to evaluate further. 3.  Suggest to stop all NSAID use.  I have discussed alternative options, risks & benefits,  which include, but are not limited to, bleeding, infection, perforation,respiratory complication &  drug reaction.  The patient agrees with this plan & written consent will be obtained.       LOS: 1 day   Shawn Bellows, MD 11/30/2018, 12:32 PM

## 2018-12-01 ENCOUNTER — Inpatient Hospital Stay: Payer: Medicare Other | Admitting: Anesthesiology

## 2018-12-01 ENCOUNTER — Ambulatory Visit: Admit: 2018-12-01 | Payer: Medicare Other | Admitting: Gastroenterology

## 2018-12-01 ENCOUNTER — Encounter
Admission: EM | Disposition: A | Discharge status: Home / Self Care | Payer: Self-pay | Source: Home / Self Care | Attending: Internal Medicine

## 2018-12-01 ENCOUNTER — Encounter: Payer: Self-pay | Admitting: Anesthesiology

## 2018-12-01 DIAGNOSIS — K920 Hematemesis: Secondary | ICD-10-CM

## 2018-12-01 DIAGNOSIS — K274 Chronic or unspecified peptic ulcer, site unspecified, with hemorrhage: Secondary | ICD-10-CM

## 2018-12-01 DIAGNOSIS — Z8719 Personal history of other diseases of the digestive system: Secondary | ICD-10-CM

## 2018-12-01 DIAGNOSIS — I25118 Atherosclerotic heart disease of native coronary artery with other forms of angina pectoris: Secondary | ICD-10-CM

## 2018-12-01 DIAGNOSIS — K269 Duodenal ulcer, unspecified as acute or chronic, without hemorrhage or perforation: Secondary | ICD-10-CM

## 2018-12-01 HISTORY — PX: ESOPHAGOGASTRODUODENOSCOPY (EGD) WITH PROPOFOL: SHX5813

## 2018-12-01 LAB — TYPE AND SCREEN
ABO/RH(D): A POS
Antibody Screen: NEGATIVE
Unit division: 0
Unit division: 0
Unit division: 0

## 2018-12-01 LAB — BASIC METABOLIC PANEL
Anion gap: 4 — ABNORMAL LOW (ref 5–15)
BUN: 56 mg/dL — ABNORMAL HIGH (ref 8–23)
CO2: 20 mmol/L — ABNORMAL LOW (ref 22–32)
Calcium: 7.9 mg/dL — ABNORMAL LOW (ref 8.9–10.3)
Chloride: 117 mmol/L — ABNORMAL HIGH (ref 98–111)
Creatinine, Ser: 1.64 mg/dL — ABNORMAL HIGH (ref 0.61–1.24)
GFR calc Af Amer: 49 mL/min — ABNORMAL LOW (ref >60)
GFR calc non Af Amer: 42 mL/min — ABNORMAL LOW (ref >60)
Glucose, Bld: 103 mg/dL — ABNORMAL HIGH (ref 70–99)
Potassium: 4.1 mmol/L (ref 3.5–5.1)
Sodium: 141 mmol/L (ref 135–145)

## 2018-12-01 LAB — BPAM RBC
Blood Product Expiration Date: 202008242359
Blood Product Expiration Date: 202009182359
Blood Product Expiration Date: 202009182359
ISSUE DATE / TIME: 202008210258
ISSUE DATE / TIME: 202008210638
ISSUE DATE / TIME: 202008212253
Unit Type and Rh: 6200
Unit Type and Rh: 6200
Unit Type and Rh: 6200

## 2018-12-01 LAB — CBC
HCT: 22.6 % — ABNORMAL LOW (ref 39.0–52.0)
Hemoglobin: 7.3 g/dL — ABNORMAL LOW (ref 13.0–17.0)
MCH: 31.5 pg (ref 26.0–34.0)
MCHC: 32.3 g/dL (ref 30.0–36.0)
MCV: 97.4 fL (ref 80.0–100.0)
Platelets: 120 10*3/uL — ABNORMAL LOW (ref 150–400)
RBC: 2.32 MIL/uL — ABNORMAL LOW (ref 4.22–5.81)
RDW: 17.2 % — ABNORMAL HIGH (ref 11.5–15.5)
WBC: 9.5 10*3/uL (ref 4.0–10.5)
nRBC: 0 % (ref 0.0–0.2)

## 2018-12-01 LAB — MAGNESIUM: Magnesium: 2 mg/dL (ref 1.7–2.4)

## 2018-12-01 SURGERY — ESOPHAGOGASTRODUODENOSCOPY (EGD) WITH PROPOFOL
Anesthesia: General

## 2018-12-01 MED ORDER — ONDANSETRON HCL 4 MG/2ML IJ SOLN
INTRAMUSCULAR | Status: AC
  Filled 2018-12-01: qty 2

## 2018-12-01 MED ORDER — ONDANSETRON HCL 4 MG/2ML IJ SOLN
INTRAMUSCULAR | Status: DC | PRN
  Administered 2018-12-01: 4 mg via INTRAVENOUS

## 2018-12-01 MED ORDER — PANTOPRAZOLE SODIUM 40 MG PO TBEC: 40.0000 mg | DELAYED_RELEASE_TABLET | Freq: Two times a day (BID) | ORAL | 1 refills | Status: DC

## 2018-12-01 MED ORDER — PROPOFOL 10 MG/ML IV BOLUS
INTRAVENOUS | Status: AC
  Filled 2018-12-01: qty 20

## 2018-12-01 MED ORDER — LIDOCAINE HCL (PF) 2 % IJ SOLN
INTRAMUSCULAR | Status: AC
  Filled 2018-12-01: qty 10

## 2018-12-01 MED ORDER — SODIUM CHLORIDE 0.9 % IV SOLN
Freq: Once | INTRAVENOUS | Status: AC
  Administered 2018-12-01: 08:00:00 via INTRAVENOUS

## 2018-12-01 MED ORDER — LACTATED RINGERS IV SOLN
INTRAVENOUS | Status: DC | PRN
  Administered 2018-12-01: 08:00:00 via INTRAVENOUS

## 2018-12-01 MED ORDER — FENTANYL CITRATE (PF) 100 MCG/2ML IJ SOLN: 25.0000 ug | INTRAMUSCULAR | Status: DC | PRN

## 2018-12-01 MED ORDER — PROPOFOL 500 MG/50ML IV EMUL
INTRAVENOUS | Status: DC | PRN
  Administered 2018-12-01: 150 ug/kg/min via INTRAVENOUS

## 2018-12-01 MED ORDER — PROPOFOL 10 MG/ML IV BOLUS
INTRAVENOUS | Status: DC | PRN
  Administered 2018-12-01: 20 mg via INTRAVENOUS
  Administered 2018-12-01: 30 mg via INTRAVENOUS

## 2018-12-01 MED ORDER — ONDANSETRON HCL 4 MG/2ML IJ SOLN: 4.0000 mg | Freq: Once | INTRAMUSCULAR | Status: DC | PRN

## 2018-12-01 MED ORDER — PHENYLEPHRINE HCL (PRESSORS) 10 MG/ML IV SOLN
INTRAVENOUS | Status: DC | PRN
  Administered 2018-12-01 (×3): 50 ug via INTRAVENOUS

## 2018-12-01 MED ORDER — PHENYLEPHRINE HCL (PRESSORS) 10 MG/ML IV SOLN
INTRAVENOUS | Status: AC
  Filled 2018-12-01: qty 1

## 2018-12-01 MED ORDER — GLYCOPYRROLATE 0.2 MG/ML IJ SOLN
INTRAMUSCULAR | Status: DC | PRN
  Administered 2018-12-01: 0.1 mg via INTRAVENOUS

## 2018-12-01 MED ORDER — LIDOCAINE HCL (PF) 2 % IJ SOLN
INTRAMUSCULAR | Status: DC | PRN
  Administered 2018-12-01: 80 mg via INTRADERMAL

## 2018-12-01 MED ORDER — DEXMEDETOMIDINE HCL IN NACL 200 MCG/50ML IV SOLN
INTRAVENOUS | Status: DC | PRN
  Administered 2018-12-01: 4 ug via INTRAVENOUS

## 2018-12-01 NOTE — Progress Notes (Signed)
Pt feeling well. Tolerated soft diet well. Ambulated loop around stations. No c/o weakness or dizziness. Ready to go home.

## 2018-12-01 NOTE — Anesthesia Preprocedure Evaluation (Signed)
Anesthesia Evaluation  Patient identified by MRN, date of birth, ID band Patient awake    Reviewed: Allergy & Precautions, H&P , NPO status , Patient's Chart, lab work & pertinent test results  Airway Mallampati: II       Dental   Pulmonary sleep apnea ,           Cardiovascular + CAD       Neuro/Psych PSYCHIATRIC DISORDERS Anxiety Depression negative neurological ROS     GI/Hepatic Neg liver ROS, GERD  Controlled,  Endo/Other  Hypothyroidism   Renal/GU CRFRenal disease  negative genitourinary   Musculoskeletal   Abdominal   Peds  Hematology  (+) Blood dyscrasia, anemia ,   Anesthesia Other Findings Past Medical History: No date: Anemia No date: Anxiety No date: Asymptomatic LV dysfunction     Comment:  50-55% by echo 06/2016 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 Lorrene Reid- NEPHROLOGIST No date: Rosacea No date: Urticaria  Past Surgical History: 09/06/2013: CARDIAC CATHETERIZATION     Comment:  [facility] No date: CARDIAC CATHETERIZATION 2004: CORONARY ANGIOPLASTY     Comment:  STENT PLACEMENT 03/19/2014: CYSTOSCOPY WITH RETROGRADE PYELOGRAM, URETEROSCOPY AND  STENT PLACEMENT; Left     Comment:  Procedure: [place],               URETEROSCOPY AND STENT PLACEMENT;  Surgeon: Junious Dresser, MD;  Location: [facility];  Service: Urology;                 Laterality: Left; 05/20/2015: CYSTOSCOPY WITH RETROGRADE PYELOGRAM, URETEROSCOPY AND  STENT PLACEMENT; Bilateral     Comment:  Procedure:  CYSTOSCOPY WITH BILATERAL RETROGRADE               PYELOGRAM,RIGHT  DIAGNOSTIC URETEROSCOPY ,LEFT               URETEROSCOPY WITH HOLMIUM LASER  AND BILATERAL  STENT               PLACEMENT ;  Surgeon: Alexis Frock, MD;  Location: [facility];  Service: Urology;  Laterality: Bilateral; 10/20/2017: DRUG INDUCED ENDOSCOPY; N/A     Comment:  Procedure: DRUG INDUCED SLEEP ENDOSCOPY;  Surgeon:               Melida Quitter, MD;  Location: [facility];               Service: ENT;  Laterality: N/A; 05/13/2016: EP IMPLANTABLE DEVICE; N/A     Comment:  Procedure: Loop Recorder Insertion;  Surgeon: Deboraha Sprang, MD;  Location: [facility];  Service:  Cardiovascular;  Laterality: N/A; 05/20/2015: HOLMIUM LASER APPLICATION; Bilateral     Comment:  Procedure: HOLMIUM LASER APPLICATION;  Surgeon: Alexis Frock, MD;  Location: [facility];  Service: Urology;                Laterality: Bilateral; 12/08/2017: IMPLIMENTATION OF A HYPOGLOSSAL NERVE STIMULATOR     Comment:  OSA  09/06/2013: LEFT HEART CATHETERIZATION WITH CORONARY ANGIOGRAM; N/A     Comment:  Procedure: LEFT HEART CATHETERIZATION WITH CORONARY               ANGIOGRAM;  Surgeon: Sinclair Grooms, MD;  Location: [facility]              CATH LAB;  Service: Cardiovascular;  Laterality: N/A; No date: LEFT KNEE CAP  SURGERY ABOUT 40 YRS AGO  ? 1970 03/19/2014: STONE EXTRACTION WITH BASKET; Left     Comment:  Procedure: STONE EXTRACTION WITH BASKET;  Surgeon: Junious Dresser, MD;  Location: [facility];  Service:               Urology;  Laterality: Left;  BMI    Body Mass Index: 23.84 kg/m      Reproductive/Obstetrics negative OB ROS                             Anesthesia Physical  Anesthesia  Plan  ASA: III  Anesthesia Plan: General   Post-op Pain Management:    Induction:   PONV Risk Score and Plan: Propofol infusion and TIVA  Airway Management Planned: Nasal Cannula  Additional Equipment:   Intra-op Plan:   Post-operative Plan:   Informed Consent: I have reviewed the patients History and Physical, chart, labs and discussed the procedure including the risks, benefits and alternatives for the proposed anesthesia with the patient or authorized representative who has indicated his/her understanding and acceptance.     Dental Advisory Given  Plan Discussed with: Anesthesiologist, CRNA and Surgeon  Anesthesia Plan Comments:         Anesthesia Quick Evaluation

## 2018-12-01 NOTE — Transfer of Care (Signed)
Immediate Anesthesia Transfer of Care Note  Patient: Shawn Lam  Procedure(s) Performed: ESOPHAGOGASTRODUODENOSCOPY (EGD) WITH PROPOFOL (N/A )  Patient Location: PACU  Anesthesia Type:MAC  Level of Consciousness: awake, alert , oriented and patient cooperative  Airway & Oxygen Therapy: Patient Spontanous Breathing  Post-op Assessment: Report given to RN and Post -op Vital signs reviewed and stable  Post vital signs: Reviewed and stable  Last Vitals:  Vitals Value Taken Time  BP    Temp    Pulse 88 12/01/18 0828  Resp 23 12/01/18 0828  SpO2 100 % 12/01/18 0828  Vitals shown include unvalidated device data.  Last Pain:  Vitals:   12/01/18 0743  TempSrc: Tympanic  PainSc: 0-No pain      Patients Stated Pain Goal: 0 (02/25/34 6701)  Complications: No apparent anesthesia complications

## 2018-12-01 NOTE — Discharge Summary (Signed)
[facility] [place] NAME: Shawn Lam    MR#:  [id number]  DATE OF BIRTH:  Oct 07, 1950  DATE OF ADMISSION:  11/28/2018 ADMITTING PHYSICIAN: Dustin Flock, MD  DATE OF DISCHARGE: 12/01/2018  PRIMARY CARE PHYSICIAN: Jinny Sanders, MD    ADMISSION DIAGNOSIS:  Nonspecific chest pain [R07.9]  DISCHARGE DIAGNOSIS:  Active Problems:   Anemia   Other chest pain   Chronic kidney disease (CKD), stage III (moderate) (HCC)   Chest pain   Hematemesis with nausea   Postbulbar duodenal ulcer   SECONDARY DIAGNOSIS:   Past Medical History:  Diagnosis Date  . Anemia   . Anxiety   . Asymptomatic LV dysfunction    50-55% by echo 06/2016  . Berger's disease   . CAD (coronary artery disease) 06/2005   PCI of LAD and RCA  . Depression   . Dilated aortic root (HCC)    4cm at sinus of Valsalva  . Hematuria   . Hyperlipidemia   . Hypothyroidism   . Insomnia   . Left ureteral stone   . Orthostatic hypotension    negative tilt table  . OSA (obstructive sleep apnea)    moderate with AHI 17/hr, uses CPAP nightly  . PVC's (premature ventricular contractions) 07/07/2016  . Renal disorder    PT IS SEEING DR. Lorrene Reid- NEPHROLOGIST  . Rosacea   . Urticaria     HOSPITAL COURSE:   68 year old male with past medical history of hypertension, hyperlipidemia, hypothyroidism, anxiety/depression, history of coronary artery disease who presented to the hospital due to chest pain.  1.  Chest pain- given patient's significant cardiac risk factors and history of coronary disease patient was admitted to the hospital and observed on telemetry.  Cardiac markers were negative, patient underwent a nuclear medicine stress test which was negative for any acute ischemia.  Echocardiogram done showed normal ejection fraction with no wall motion abnormalities. -Patient also had a CT scan of the chest which showed no evidence of pulmonary embolism.  Patient's chest pain is  now resolved and is clinically asymptomatic.  2.  Syncope-patient had a vasovagal syncopal episode when having a bowel movement and was noted to be hypotensive.  This was secondary to GI bleed and acute blood loss anemia. - After giving some blood and given some fluids patient has had no further syncopal episodes.  There was no runs of arrhythmia on telemetry.  3.  GI bleed-this was a upper GI bleed given the patient's dark stools. - Patient was transfused 1 unit of packed red blood cells and hemoglobin has improved posttransfusion is currently stable.  Patient's hemoglobin was down to as low as 6.6 and now up to 7.3 upon discharge.  -Patient underwent an upper GI endoscopy which showed a duodenal ulcer but it was nonbleeding.  This was likely the source of patient's GI bleed.  Patient was placed on Protonix and currently being discharged on that.  - follow up with GI as outpatient and follow up on biopsies on ulcer to r/o H. Pylori. Pt. To resume Plavix till tomorrow.    4. Osteoporosis.-Patient will continue his Forteo  5.  Hypothyroidism-patient will continue his Synthroid.  6.  Depression-patient will continue his Prozac.   DISCHARGE CONDITIONS:   Stable.   CONSULTS OBTAINED:  Treatment Team:  Nelva Bush, MD  DRUG ALLERGIES:   Allergies  Allergen Reactions  . Codeine Nausea Only  . Tape Other (See Comments)    Use only paper  tape. Severe blistering and bruising with other tapes. No cloth tape.    DISCHARGE MEDICATIONS:   Allergies as of 12/01/2018      Reactions   Codeine Nausea Only   Tape Other (See Comments)   Use only paper tape. Severe blistering and bruising with other tapes. No cloth tape.      Medication List    STOP taking these medications   aspirin EC 81 MG tablet   diclofenac sodium 1 % Gel Commonly known as: VOLTAREN   fludrocortisone 0.1 MG tablet Commonly known as: FLORINEF     TAKE these medications   alendronate 70 MG tablet Commonly  known as: FOSAMAX Take 1 tablet (70 mg total) by mouth every 7 (seven) days. Take with a full glass of water on an empty stomach.   atorvastatin 80 MG tablet Commonly known as: LIPITOR Take 1 tablet (80 mg total) by mouth daily.   clopidogrel 75 MG tablet Commonly known as: PLAVIX TAKE 1 TABLET DAILY   cyclobenzaprine 10 MG tablet Commonly known as: FLEXERIL TAKE 1/2-1 TAB BY MOUTH AT BEDTIME AS NEEDED FOR MUSCLE SPASMS   diclofenac 75 MG EC tablet Commonly known as: VOLTAREN TAKE 1 TABLET BY MOUTH TWICE A DAY   ferrous sulfate 325 (65 FE) MG tablet TAKE 1 TABLET BY MOUTH 3 TIMES DAILY WITH MEALS.   Fluocinolone Acetonide Body 0.01 % Oil APPLY 1 ML ON THE SKIN TWICE A DAY   FLUoxetine 40 MG capsule Commonly known as: PROZAC TAKE 1 CAPSULE EVERY       MORNING   FLUoxetine 20 MG capsule Commonly known as: PROZAC TAKE 1 CAPSULE DAILY. TAKE WITH 40MG  TO EQUAL 60MG     ONCE DAILY   Forteo 600 MCG/2.4ML Soln Generic drug: Teriparatide (Recombinant) Inject 20 mcg daily into the skin.   furosemide 20 MG tablet Commonly known as: LASIX TAKE 1 TABLET DAILY   hydrOXYzine 25 MG tablet Commonly known as: ATARAX/VISTARIL TAKE 1 TABLET BY MOUTH AT BEDTIME MAY MAKE DROWSY   levothyroxine 25 MCG tablet Commonly known as: SYNTHROID Take 1 tablet (25 mcg total) by mouth daily before breakfast.   midodrine 5 MG tablet Commonly known as: PROAMATINE TAKE 1 TABLET (5 MG TOTAL) BY MOUTH 2 (TWO) TIMES DAILY WITH BREAKFAST AND LUNCH.   mirtazapine 15 MG tablet Commonly known as: REMERON TAKE 1 TABLET BY MOUTH EVERYDAY AT BEDTIME   nitroGLYCERIN 0.4 MG SL tablet Commonly known as: Nitrostat Place 1 tablet (0.4 mg total) under the tongue every 5 (five) minutes as needed for chest pain.   pantoprazole 40 MG tablet Commonly known as: PROTONIX Take 1 tablet (40 mg total) by mouth 2 (two) times daily.   vitamin B-12 1000 MCG tablet Commonly known as: CYANOCOBALAMIN Take 1,000 mcg by  mouth daily.   zolpidem 5 MG tablet Commonly known as: AMBIEN TAKE 1 TABLET BY MOUTH AT BEDTIME AS NEEDED FOR SLEEP         DISCHARGE INSTRUCTIONS:   DIET:  Cardiac diet  DISCHARGE CONDITION:  Stable  ACTIVITY:  Activity as tolerated  OXYGEN:  Home Oxygen: No.   Oxygen Delivery: room air  DISCHARGE LOCATION:  home   If you experience worsening of your admission symptoms, develop shortness of breath, life threatening emergency, suicidal or homicidal thoughts you must seek medical attention immediately by calling 911 or calling your MD immediately  if symptoms less severe.  You Must read complete instructions/literature along with all the possible adverse reactions/side effects for all  the Medicines you take and that have been prescribed to you. Take any new Medicines after you have completely understood and accpet all the possible adverse reactions/side effects.   Please note  You were cared for by a hospitalist during your hospital stay. If you have any questions about your discharge medications or the care you received while you were in the hospital after you are discharged, you can call the unit and asked to speak with the hospitalist on call if the hospitalist that took care of you is not available. Once you are discharged, your primary care physician will handle any further medical issues. Please note that NO REFILLS for any discharge medications will be authorized once you are discharged, as it is imperative that you return to your primary care physician (or establish a relationship with a primary care physician if you do not have one) for your aftercare needs so that they can reassess your need for medications and monitor your lab values.     Today   Status post endoscopy today showing duodenal ulcer which was not acutely bleeding.  Hemoglobin is currently stable posttransfusion.  Patient is anxious and wants to go home.  Will ambulate patient and if he is able to  tolerate p.o. without any acute symptoms and ambulate well he can be discharged home.  VITAL SIGNS:  Blood pressure (!) 101/58, pulse 88, temperature 98.5 F (36.9 C), temperature source Oral, resp. rate 18, height 6' (1.829 m), weight 81.9 kg, SpO2 99 %.  I/O:    Intake/Output Summary (Last 24 hours) at 12/01/2018 1510 Last data filed at 12/01/2018 1100 Gross per 24 hour  Intake 2838.14 ml  Output 1975 ml  Net 863.14 ml    PHYSICAL EXAMINATION:   GENERAL:  68 y.o.-year-old patient lying in the bed with no acute distress.  EYES: Pupils equal, round, reactive to light and accommodation. No scleral icterus. Extraocular muscles intact.  HEENT: Head atraumatic, normocephalic. Oropharynx and nasopharynx clear.  NECK:  Supple, no jugular venous distention. No thyroid enlargement, no tenderness.  LUNGS: Normal breath sounds bilaterally, no wheezing, rales,rhonchi. No use of accessory muscles of respiration.  CARDIOVASCULAR: S1, S2 normal. No murmurs, rubs, or gallops.  ABDOMEN: Soft, non-tender, non-distended. Bowel sounds present. No organomegaly or mass.  EXTREMITIES: No pedal edema, cyanosis, or clubbing.  NEUROLOGIC: Cranial nerves II through XII are intact. No focal motor or sensory defecits b/l.  PSYCHIATRIC: The patient is alert and oriented x 3.  SKIN: No obvious rash, lesion, or ulcer.   DATA REVIEW:   CBC Recent Labs  Lab 12/01/18 0606  WBC 9.5  HGB 7.3*  HCT 22.6*  PLT 120*    Chemistries  Recent Labs  Lab 12/01/18 0606  NA 141  K 4.1  CL 117*  CO2 20*  GLUCOSE 103*  BUN 56*  CREATININE 1.64*  CALCIUM 7.9*  MG 2.0    Cardiac Enzymes No results for input(s): TROPONINI in the last 168 hours.  Microbiology Results  Results for orders placed or performed during the hospital encounter of 11/28/18  SARS CORONAVIRUS 2 Nasal Swab Aptima Multi Swab     Status: None   Collection Time: 11/28/18  1:22 PM   Specimen: Aptima Multi Swab; Nasal Swab  Result Value  Ref Range Status   SARS Coronavirus 2 NEGATIVE NEGATIVE Final    Comment: (NOTE) SARS-CoV-2 target nucleic acids are NOT DETECTED. The SARS-CoV-2 RNA is generally detectable in upper and lower respiratory specimens during the acute phase of  infection. Negative results do not preclude SARS-CoV-2 infection, do not rule out co-infections with other pathogens, and should not be used as the sole basis for treatment or other patient management decisions. Negative results must be combined with clinical observations, patient history, and epidemiological information. The expected result is Negative. Fact Sheet for Patients: SugarRoll.be Fact Sheet for Healthcare Providers: https://www.woods-mathews.com/ This test is not yet approved or cleared by the [place] FDA and  has been authorized for detection and/or diagnosis of SARS-CoV-2 by FDA under an Emergency Use Authorization (EUA). This EUA will remain  in effect (meaning this test can be used) for the duration of the COVID-19 declaration under Section 56 4(b)(1) of the Act, 21 U.S.C. section 360bbb-3(b)(1), unless the authorization is terminated or revoked sooner. Performed at [facility], [place] [address], [place], [place] [address]     RADIOLOGY:  Dg Chest 1 View  Result Date: 11/30/2018 CLINICAL DATA:  68 year old male with a history dyspnea EXAM: CHEST  1 VIEW COMPARISON:  November 29, 2018, CT chest November 28, 2018 FINDINGS: Cardiomediastinal silhouette unchanged in size and contour. Unchanged position of right chest wall vagal nerve stimulator and the event recorder on the left chest wall. No pneumothorax. No pleural effusion. No confluent airspace disease. Coarsened interstitial markings. IMPRESSION: Negative for acute cardiopulmonary disease with background of chronic changes. Electronically Signed   By: Corrie Mckusick D.O.   On: 11/30/2018 14:17   Dg Chest 2 View  Result Date:  11/29/2018 CLINICAL DATA:  Syncope today. Code blue called, pt unresponsive, cardiac arrest. Pt states concern for internal bleeding. Hx of CAD. Nonsmoker. EXAM: CHEST - 2 VIEW COMPARISON:  11/28/2018 FINDINGS: Lungs are clear. Heart size normal. Monitoring device overlies the left hemithorax. Generator device projects over the right upper chest as before. No effusion. T12 compression deformity, post cement augmentation. IMPRESSION: No acute cardiopulmonary disease. Electronically Signed   By: Lucrezia Europe M.D.   On: 11/29/2018 17:00   Ct Head Wo Contrast  Result Date: 11/29/2018 CLINICAL DATA:  Encephalopathy. Syncopal episode in the emergency room. Negative stress test this morning. EXAM: CT HEAD WITHOUT CONTRAST TECHNIQUE: Contiguous axial images were obtained from the base of the skull through the vertex without intravenous contrast. COMPARISON:  None. FINDINGS: Brain: Ventricles, cisterns and other CSF spaces are normal. There is no mass, mass effect, shift of midline structures or acute hemorrhage. No evidence of acute infarction. Vascular: No hyperdense vessel or unexpected calcification. Skull: Normal. Negative for fracture or focal lesion. Sinuses/Orbits: Orbits are normal. Paranasal sinuses are well developed as there is small air-fluid level over the left sphenoid sinus. Mastoid air cells are clear. Other: None. IMPRESSION: No acute findings. Subtle air-fluid level over the left sphenoid sinus. Electronically Signed   By: Marin Olp M.D.   On: 11/29/2018 16:48   Dg Abd Portable 1v  Result Date: 11/30/2018 CLINICAL DATA:  Abdominal pain and vomiting. EXAM: PORTABLE ABDOMEN - 1 VIEW COMPARISON:  Abdominal x-ray 08/07/2018 FINDINGS: No bowel dilatation to suggest obstruction. Small to moderate colonic stool burden. No evidence of free air. No radiopaque calculi or abnormal soft tissue calcifications. Overlying monitoring devices. Chronic compression fractures in the lower thoracic and upper lumbar  spine. IMPRESSION: Normal bowel gas pattern. Small to moderate colonic stool burden. Electronically Signed   By: Keith Rake M.D.   On: 11/30/2018 02:18      Management plans discussed with the patient, family and they are in agreement.  CODE STATUS:  Code Status Orders  (From admission, onward)         Start     Ordered   11/28/18 1430  Full code  Continuous     11/28/18 1429       TOTAL TIME TAKING CARE OF THIS PATIENT: 40 minutes.    Henreitta Leber M.D on 12/01/2018 at 3:10 PM  Between 7am to 6pm - Pager - [phone]  After 6pm go to [email or web address] - Proofreader  [facility]  Office  [phone]  CC: Primary care physician; Jinny Sanders, MD

## 2018-12-01 NOTE — Plan of Care (Signed)
  Problem: Education: Goal: Knowledge of General Education information will improve Description Including pain rating scale, medication(s)/side effects and non-pharmacologic comfort measures Outcome: Progressing   

## 2018-12-01 NOTE — Progress Notes (Signed)
Last blood in blood bank transfusing now

## 2018-12-01 NOTE — Progress Notes (Signed)
The patient had an EGD with a duodenal ulcer that had a clean base without any signs is a visible vessel or bleeding.  The patient should be treated with a PPI and have his H. pylori checked through blood for an antibody and treated if positive.  Nothing further to do from a GI point of view.  I will sign off.  Please call if any further GI concerns or questions.  We would like to thank you for the opportunity to participate in the care of Shawn Lam.

## 2018-12-01 NOTE — Anesthesia Post-op Follow-up Note (Signed)
Anesthesia QCDR form completed.        

## 2018-12-01 NOTE — Progress Notes (Addendum)
Progress Note  Patient Name: Shawn Lam Date of Encounter: 12/01/2018  Primary Cardiologist: Fransico Him, MD  Subjective   Tolerated EGD with no complications this morning Case discussed with Dr. Allen Norris in detail, Clean-based ulcer no signs of clot or bleeding  Patient reports that he feels well, would like to consider going home later  Denies any significant chest pain concerning for angina  Inpatient Medications    Scheduled Meds: . sodium chloride   Intravenous Once  . atorvastatin  80 mg Oral Daily  . ferrous sulfate  325 mg Oral Q breakfast  . fludrocortisone  0.1 mg Oral Daily  . FLUoxetine  60 mg Oral q morning - 10a  . levothyroxine  25 mcg Oral Q0600  . midodrine  5 mg Oral BID WC  . mirtazapine  15 mg Oral QHS  . [START ON 12/03/2018] pantoprazole  40 mg Intravenous Q12H  . sodium chloride flush  3 mL Intravenous Q12H  . vitamin B-12  1,000 mcg Oral Daily   Continuous Infusions: . sodium chloride Stopped (11/30/18 0242)  . sodium chloride 100 mL/hr at 11/30/18 2338  . pantoprozole (PROTONIX) infusion 8 mg/hr (12/01/18 0224)   PRN Meds: sodium chloride, acetaminophen **OR** acetaminophen, HYDROcodone-acetaminophen, hydrOXYzine, nitroGLYCERIN, ondansetron **OR** ondansetron (ZOFRAN) IV, sodium chloride flush, zolpidem   Vital Signs    Vitals:   12/01/18 0852 12/01/18 0858 12/01/18 0901 12/01/18 0907  BP:  (!) 106/56 (!) 106/56 (!) 101/58  Pulse: 87 85 89 88  Resp: 15 18  18   Temp:  98.1 F (36.7 C)  98.5 F (36.9 C)  TempSrc:    Oral  SpO2: 96% 98% 100% 99%  Weight:      Height:        Intake/Output Summary (Last 24 hours) at 12/01/2018 1303 Last data filed at 12/01/2018 1100 Gross per 24 hour  Intake 2838.14 ml  Output 1975 ml  Net 863.14 ml   Last 3 Weights 12/01/2018 12/01/2018 11/30/2018  Weight (lbs) 180 lb 8.9 oz 180 lb 11.2 oz 175 lb 12.8 oz  Weight (kg) 81.9 kg 81.965 kg 79.742 kg  Some encounter information is confidential and  restricted. Go to Review Flowsheets activity to see all data.      Telemetry    Normal sinus rhythm personally Reviewed  ECG    No new tracings - Personally Reviewed  Physical Exam   Constitutional:  oriented to person, place, and time. No distress.  Appears moderately pale HENT:  Head: Grossly normal Eyes:  no discharge. No scleral icterus.  Neck: No JVD, no carotid bruits  Cardiovascular: Regular rate and rhythm, no murmurs appreciated Pulmonary/Chest: Clear to auscultation bilaterally, no wheezes or rails Abdominal: Soft.  no distension.  no tenderness.  Musculoskeletal: Normal range of motion Neurological:  normal muscle tone. Coordination normal. No atrophy Skin: Skin warm and dry Psychiatric: normal affect, pleasant   Labs    High Sensitivity Troponin:   Recent Labs  Lab 11/28/18 0903 11/28/18 1035 11/30/18 0107  TROPONINIHS 5 6 6       Cardiac EnzymesNo results for input(s): TROPONINI in the last 168 hours. No results for input(s): TROPIPOC in the last 168 hours.   Chemistry Recent Labs  Lab 11/29/18 0655 11/30/18 0107 12/01/18 0606  NA 138 135 141  K 4.2 4.3 4.1  CL 111 111 117*  CO2 22 17* 20*  GLUCOSE 105* 155* 103*  BUN 67* 84* 56*  CREATININE 1.70* 2.15* 1.64*  CALCIUM 8.4* 8.4* 7.9*  GFRNONAA 41* 31* 42*  GFRAA 47* 35* 49*  ANIONGAP 5 7 4*     Hematology Recent Labs  Lab 11/29/18 0655  11/30/18 0107 11/30/18 1102 11/30/18 1745 12/01/18 0606  WBC 7.1  --  13.1*  --   --  9.5  RBC 2.64*  --  2.00*  --   --  2.32*  HGB 8.5*   < > 6.6*  6.6* 7.8* 7.1* 7.3*  HCT 27.0*   < > 20.9*  21.0* 23.8* 21.6* 22.6*  MCV 102.3*  --  104.5*  --   --  97.4  MCH 32.2  --  33.0  --   --  31.5  MCHC 31.5  --  31.6  --   --  32.3  RDW 14.3  --  14.2  --   --  17.2*  PLT 216  --  249  --   --  120*   < > = values in this interval not displayed.    BNPNo results for input(s): BNP, PROBNP in the last 168 hours.   DDimer No results for input(s):  DDIMER in the last 168 hours.   Radiology    Dg Chest 1 View  Result Date: 11/30/2018 CLINICAL DATA:  68 year old male with a history dyspnea EXAM: CHEST  1 VIEW COMPARISON:  November 29, 2018, CT chest November 28, 2018 FINDINGS: Cardiomediastinal silhouette unchanged in size and contour. Unchanged position of right chest wall vagal nerve stimulator and the event recorder on the left chest wall. No pneumothorax. No pleural effusion. No confluent airspace disease. Coarsened interstitial markings. IMPRESSION: Negative for acute cardiopulmonary disease with background of chronic changes. Electronically Signed   By: Corrie Mckusick D.O.   On: 11/30/2018 14:17   Dg Chest 2 View  Result Date: 11/29/2018 CLINICAL DATA:  Syncope today. Code blue called, pt unresponsive, cardiac arrest. Pt states concern for internal bleeding. Hx of CAD. Nonsmoker. EXAM: CHEST - 2 VIEW COMPARISON:  11/28/2018 FINDINGS: Lungs are clear. Heart size normal. Monitoring device overlies the left hemithorax. Generator device projects over the right upper chest as before. No effusion. T12 compression deformity, post cement augmentation. IMPRESSION: No acute cardiopulmonary disease. Electronically Signed   By: Lucrezia Europe M.D.   On: 11/29/2018 17:00   Ct Head Wo Contrast  Result Date: 11/29/2018 CLINICAL DATA:  Encephalopathy. Syncopal episode in the emergency room. Negative stress test this morning. EXAM: CT HEAD WITHOUT CONTRAST TECHNIQUE: Contiguous axial images were obtained from the base of the skull through the vertex without intravenous contrast. COMPARISON:  None. FINDINGS: Brain: Ventricles, cisterns and other CSF spaces are normal. There is no mass, mass effect, shift of midline structures or acute hemorrhage. No evidence of acute infarction. Vascular: No hyperdense vessel or unexpected calcification. Skull: Normal. Negative for fracture or focal lesion. Sinuses/Orbits: Orbits are normal. Paranasal sinuses are well developed as  there is small air-fluid level over the left sphenoid sinus. Mastoid air cells are clear. Other: None. IMPRESSION: No acute findings. Subtle air-fluid level over the left sphenoid sinus. Electronically Signed   By: Marin Olp M.D.   On: 11/29/2018 16:48   Dg Abd Portable 1v  Result Date: 11/30/2018 CLINICAL DATA:  Abdominal pain and vomiting. EXAM: PORTABLE ABDOMEN - 1 VIEW COMPARISON:  Abdominal x-ray 08/07/2018 FINDINGS: No bowel dilatation to suggest obstruction. Small to moderate colonic stool burden. No evidence of free air. No radiopaque calculi or abnormal soft tissue calcifications. Overlying monitoring devices. Chronic compression fractures in the lower thoracic  and upper lumbar spine. IMPRESSION: Normal bowel gas pattern. Small to moderate colonic stool burden. Electronically Signed   By: Keith Rake M.D.   On: 11/30/2018 02:18    Cardiac Studies   11/30/2018 Myocardial perfusion is normal.   The study is normal.   This is a low risk study.  Overall left ventricular systolic function was normal.    LV cavity size is normal.  The left ventricular ejection fraction is normal (55-65%).     07/05/2017 Echo Left ventricle: The cavity size was normal. Wall thickness was increased in a pattern of mild LVH. Systolic function was normal. The estimated ejection fraction was in the range of 60% to 65%. Wall motion was normal; there were no regional wall motion abnormalities. Doppler parameters are consistent with abnormal left ventricular relaxation (grade 1 diastolic dysfunction). The E/e&' ratio is <8, suggesting normal LV filling pressure. - Aorta: The sinus of valsalva is dilated at 4.0 cm. - Left atrium: The atrium was normal in size. - Right atrium: The atrium was at the upper limits of normal in size. - Tricuspid valve: There was trivial regurgitation. - Pulmonary arteries: PA peak pressure: 22 mm Hg (S). - Inferior vena cava: The vessel was normal in size. The  respirophasic diameter changes were in the normal range (= 50%), consistent with normal central venous pressure.  Impressions:  - Compared to a prior study in 06/2016, the LVEF is higher at 60-65%. The aorta is dilated to 4.0 cm at the sinus of valsalva, but measures 3.5 cm at the aortic root.  LHC  08/2013 The left main coronary artery is normal.  The left anterior descending artery is widely patent. The proximal LAD stent contains distal margin 30% narrowing due to ISR.[name]  The left circumflex artery is widely patent.  The right coronary artery is widely patent. The mid right coronary stent is without evidence of ISR.   Patient Profile     68 y.o. male hx of CAD s/p PCI to LAD and RCA (2007), 2015 cath showing pLAD stent with distal margin 30% narrowing due to ISR, 2019 echo with mildly dilated aortic root, orthostatic hypertension and palpitations s/p Medtronic LINQ Loop insertion (Serial # [id number]) 05/13/2016, HLD, macrocytic anemia with past suspicion for hematuria, recent melena FOBT negative, h/o epistaxis, hypothyroidism, Berger's dz with known CKD, history of lymphedema (lymphedema clinic in [place]), and moderate OSA s/p inserted inspire device 11/2017 due to CPAP intolerance and who is being seen today for the evaluation of chest pain.  Assessment & Plan    GI bleed Dramatic drop this admission, EGD clean-based ulcer Discussed with GI, recommends double dose PPI presumably for 1 month then down to daily dosing -Would restart Plavix tomorrow, hold aspirin  Atypical chest pain Stress test normal No further ischemic work-up needed at this time Restart Plavix tomorrow  Cad with stable angina No recent intervention Recent stress no ischemia Normal ejection fraction  Near syncope Known history of orthostatic hypotension Secondary to vasovagal episode 2 days ago in the setting of GI bleed and chronic hypotension No further work-up needed Would continue  Florinef and midodrine at outpatient dosing  check orthostatics  OSA August 2019 had placement of inspire device without complication.  Discharge instructions discussed with him Endoscopy details discussed with him, management of antiplatelet medication, management of medications for orthostasis  Total encounter time more than 35 minutes  Greater than 50% was spent in counseling and coordination of care with the patient   For  questions or updates, please contact [place] Please consult [email or web address] for contact info under        Signed, Ida Rogue, MD  12/01/2018, 1:03 PM

## 2018-12-02 NOTE — Anesthesia Postprocedure Evaluation (Signed)
Anesthesia Post Note  Patient: DIRCK BUTCH  Procedure(s) Performed: ESOPHAGOGASTRODUODENOSCOPY (EGD) WITH PROPOFOL (N/A )  Patient location during evaluation: Endoscopy Anesthesia Type: General Level of consciousness: awake and alert and oriented Pain management: pain level controlled Vital Signs Assessment: post-procedure vital signs reviewed and stable Respiratory status: spontaneous breathing Cardiovascular status: blood pressure returned to baseline Anesthetic complications: no     Last Vitals:  Vitals:   12/01/18 0901 12/01/18 0907  BP: (!) 106/56 (!) 101/58  Pulse: 89 88  Resp:  18  Temp:  36.9 C  SpO2: 100% 99%    Last Pain:  Vitals:   12/01/18 0907  TempSrc: Oral  PainSc:                  Zilpha Mcandrew

## 2018-12-03 ENCOUNTER — Telehealth: Payer: Self-pay

## 2018-12-03 ENCOUNTER — Encounter: Payer: Self-pay | Admitting: Gastroenterology

## 2018-12-03 NOTE — Telephone Encounter (Signed)
Transition Care Management Follow-up Telephone Call   Date discharged? 12/01/2018   How have you been since you were released from the hospital? Still feeling tired. A little bit of pain in his stomach and was told this would be normal. Otherwise feels fine.    Do you understand why you were in the hospital? yes   Do you understand the discharge instructions? yes   Where were you discharged to? Home with his wife.   Items Reviewed:  Medications reviewed: yes  Allergies reviewed: yes  Dietary changes reviewed:yes  Referrals reviewed:yes-f/u PCP, cardiology and GI.   Functional Questionnaire:   Activities of Daily Living (ADLs):   He states they are independent in the following: ambulation, dressing, bathing, hygiene, toileting, fixing food. States they require assistance with the following: none   Any transportation issues/concerns?: no   Any patient concerns? Not at this time.   Confirmed importance and date/time of follow-up visits scheduled yes  Provider Appointment booked with Dr. Diona Browner on 12/04/2018.   Confirmed with patient if condition begins to worsen call PCP or go to the ER.  Patient was given the office number and encouraged to call back with question or concerns.  : yes.

## 2018-12-04 ENCOUNTER — Ambulatory Visit (INDEPENDENT_AMBULATORY_CARE_PROVIDER_SITE_OTHER): Payer: Medicare Other | Admitting: Family Medicine

## 2018-12-04 ENCOUNTER — Encounter: Payer: Self-pay | Admitting: Family Medicine

## 2018-12-04 ENCOUNTER — Other Ambulatory Visit: Payer: Self-pay

## 2018-12-04 VITALS — BP 120/62 | HR 83 | Temp 98.3°F | Ht 72.0 in | Wt 179.0 lb

## 2018-12-04 DIAGNOSIS — N183 Chronic kidney disease, stage 3 unspecified: Secondary | ICD-10-CM

## 2018-12-04 DIAGNOSIS — D509 Iron deficiency anemia, unspecified: Secondary | ICD-10-CM

## 2018-12-04 DIAGNOSIS — Z23 Encounter for immunization: Secondary | ICD-10-CM | POA: Diagnosis not present

## 2018-12-04 DIAGNOSIS — K269 Duodenal ulcer, unspecified as acute or chronic, without hemorrhage or perforation: Secondary | ICD-10-CM | POA: Diagnosis not present

## 2018-12-04 DIAGNOSIS — R079 Chest pain, unspecified: Secondary | ICD-10-CM

## 2018-12-04 LAB — CBC WITH DIFFERENTIAL/PLATELET
Basophils Absolute: 0.1 10*3/uL (ref 0.0–0.1)
Basophils Relative: 1.2 % (ref 0.0–3.0)
Eosinophils Absolute: 0.4 10*3/uL (ref 0.0–0.7)
Eosinophils Relative: 5 % (ref 0.0–5.0)
HCT: 24.6 % — ABNORMAL LOW (ref 39.0–52.0)
Hemoglobin: 8.3 g/dL — ABNORMAL LOW (ref 13.0–17.0)
Lymphocytes Relative: 21.3 % (ref 12.0–46.0)
Lymphs Abs: 1.6 10*3/uL (ref 0.7–4.0)
MCHC: 33.9 g/dL (ref 30.0–36.0)
MCV: 96.8 fl (ref 78.0–100.0)
Monocytes Absolute: 0.5 10*3/uL (ref 0.1–1.0)
Monocytes Relative: 7.1 % (ref 3.0–12.0)
Neutro Abs: 5 10*3/uL (ref 1.4–7.7)
Neutrophils Relative %: 65.4 % (ref 43.0–77.0)
Platelets: 202 10*3/uL (ref 150.0–400.0)
RBC: 2.54 Mil/uL — ABNORMAL LOW (ref 4.22–5.81)
RDW: 16.9 % — ABNORMAL HIGH (ref 11.5–15.5)
WBC: 7.7 10*3/uL (ref 4.0–10.5)

## 2018-12-04 LAB — BASIC METABOLIC PANEL
BUN: 19 mg/dL (ref 6–23)
CO2: 23 mEq/L (ref 19–32)
Calcium: 8.7 mg/dL (ref 8.4–10.5)
Chloride: 107 mEq/L (ref 96–112)
Creatinine, Ser: 1.42 mg/dL (ref 0.40–1.50)
GFR: 49.51 mL/min — ABNORMAL LOW (ref >60.00)
Glucose, Bld: 101 mg/dL — ABNORMAL HIGH (ref 70–99)
Potassium: 3.5 mEq/L (ref 3.5–5.1)
Sodium: 139 mEq/L (ref 135–145)

## 2018-12-04 NOTE — Progress Notes (Signed)
Chief Complaint  Patient presents with  . 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  Admitted on 8/19 to 12/01/2018 Hospital course: 1.  Chest pain- given patient's significant cardiac risk factors and history of coronary disease patient was admitted to the hospital and observed on telemetry.  Cardiac markers were negative, patient underwent a nuclear medicine stress test which was negative for any acute ischemia.  Echocardiogram done showed normal ejection fraction with no wall motion abnormalities. -Patient also had a CT scan of the chest which showed no evidence of pulmonary embolism.  Patient's chest pain is now resolved and is clinically asymptomatic.  2.  Syncope-patient had a vasovagal syncopal episode when having a bowel movement and was noted to be hypotensive.  This was secondary to GI bleed and acute blood loss anemia. - After giving some blood and given some fluids patient has had no further syncopal episodes.  There was no runs of arrhythmia on telemetry.  3.  GI bleed-this was a upper GI bleed given the patient's dark stools. - Patient was transfused 1 unit of packed red blood cells and hemoglobin has improved posttransfusion is currently stable.  Patient's hemoglobin was down to as low as 6.6 and now up to 7.3 upon discharge.  -Patient underwent an upper GI endoscopy which showed a duodenal ulcer but it was nonbleeding.  This was likely the source of patient's GI bleed.  Patient was placed on Protonix and currently being discharged on that.  - follow up with GI as outpatient and follow up on biopsies on ulcer to r/o H. Pylori. Pt. To resume Plavix  8/23    Today he reports he is tired overall. No chest pain but having some  Started on protonix.  Continuing iron 325 mg TID.  No BM since hospital.  Back to normal diet.  Having  some mild low abd pain.  Still with some lighthededness sitting to standing.    On midrine for low BP.   Has follow up Sept 16 with GI.   BP Readings from Last 3 Encounters:  12/04/18 120/62  12/01/18 (!) 101/58  11/09/18 120/76     No BMs since hospital... eating wel.     COVID 19 screen No recent travel or known exposure to COVID19 The patient denies respiratory symptoms of COVID 19 at this time.  The importance of social distancing was discussed today.   Review of Systems  Constitutional: Positive for malaise/fatigue.  Gastrointestinal: Positive for abdominal pain. Negative for diarrhea, heartburn, nausea and vomiting.  Neurological: Positive for dizziness.      Past Medical History:  Diagnosis Date  . Anemia   . Anxiety   . Asymptomatic LV dysfunction    50-55% by echo 06/2016  . Berger's disease   . CAD (coronary artery disease) 06/2005   PCI of LAD and RCA  . Depression   . Dilated aortic root (HCC)    4cm at sinus of Valsalva  . Hematuria   . Hyperlipidemia   . Hypothyroidism   . Insomnia   . Left ureteral stone   . Orthostatic hypotension    negative tilt table  . OSA (obstructive sleep apnea)    moderate with AHI 17/hr, uses CPAP nightly  . PVC's (premature ventricular contractions) 07/07/2016  . Renal disorder    PT IS SEEING DR. Lorrene Reid- NEPHROLOGIST  .  Rosacea   . Urticaria     reports that he has never smoked. He has never used smokeless tobacco. He reports current alcohol use of about 1.0 standard drinks of alcohol per week. He reports that he does not use drugs.   Current Outpatient Medications:  .  alendronate (FOSAMAX) 70 MG tablet, Take 1 tablet (70 mg total) by mouth every 7 (seven) days. Take with a full glass of water on an empty stomach., Disp: 4 tablet, Rfl: 11 .  atorvastatin (LIPITOR) 80 MG tablet, Take 1 tablet (80 mg total) by mouth daily., Disp: 90 tablet, Rfl: 3 .  clopidogrel (PLAVIX) 75 MG tablet, TAKE 1 TABLET DAILY, Disp: 90  tablet, Rfl: 0 .  cyclobenzaprine (FLEXERIL) 10 MG tablet, TAKE 1/2-1 TAB BY MOUTH AT BEDTIME AS NEEDED FOR MUSCLE SPASMS, Disp: 30 tablet, Rfl: 0 .  ferrous sulfate 325 (65 FE) MG tablet, TAKE 1 TABLET BY MOUTH 3 TIMES DAILY WITH MEALS., Disp: 270 tablet, Rfl: 3 .  FLUoxetine (PROZAC) 20 MG capsule, TAKE 1 CAPSULE DAILY. TAKE WITH 40MG  TO EQUAL 60MG     ONCE DAILY, Disp: 90 capsule, Rfl: 1 .  FLUoxetine (PROZAC) 40 MG capsule, TAKE 1 CAPSULE EVERY       MORNING, Disp: 90 capsule, Rfl: 1 .  furosemide (LASIX) 20 MG tablet, TAKE 1 TABLET DAILY, Disp: 90 tablet, Rfl: 3 .  hydrOXYzine (ATARAX/VISTARIL) 25 MG tablet, TAKE 1 TABLET BY MOUTH AT BEDTIME MAY MAKE DROWSY, Disp: , Rfl: 3 .  levothyroxine (SYNTHROID, LEVOTHROID) 25 MCG tablet, Take 1 tablet (25 mcg total) by mouth daily before breakfast., Disp: 90 tablet, Rfl: 3 .  midodrine (PROAMATINE) 5 MG tablet, TAKE 1 TABLET (5 MG TOTAL) BY MOUTH 2 (TWO) TIMES DAILY WITH BREAKFAST AND LUNCH., Disp: 180 tablet, Rfl: 3 .  mirtazapine (REMERON) 15 MG tablet, TAKE 1 TABLET BY MOUTH EVERYDAY AT BEDTIME, Disp: 90 tablet, Rfl: 1 .  nitroGLYCERIN (NITROSTAT) 0.4 MG SL tablet, Place 1 tablet (0.4 mg total) under the tongue every 5 (five) minutes as needed for chest pain., Disp: 10 tablet, Rfl: 1 .  pantoprazole (PROTONIX) 40 MG tablet, Take 1 tablet (40 mg total) by mouth 2 (two) times daily., Disp: 60 tablet, Rfl: 1 .  vitamin B-12 (CYANOCOBALAMIN) 1000 MCG tablet, Take 1,000 mcg by mouth daily. , Disp: , Rfl:  .  zolpidem (AMBIEN) 5 MG tablet, TAKE 1 TABLET BY MOUTH AT BEDTIME AS NEEDED FOR SLEEP, Disp: , Rfl:    Observations/Objective: Blood pressure 120/62, pulse 83, temperature 98.3 F (36.8 C), temperature source Temporal, height 6' (1.829 m), weight 179 lb (81.2 kg), SpO2 99 %.  Physical Exam Constitutional:      Appearance: He is well-developed.  HENT:     Head: Normocephalic.     Right Ear: Hearing normal.     Left Ear: Hearing normal.     Nose:  Nose normal.  Neck:     Thyroid: No thyroid mass or thyromegaly.     Vascular: No carotid bruit.     Trachea: Trachea normal.  Cardiovascular:     Rate and Rhythm: Normal rate and regular rhythm.     Pulses: Normal pulses.     Heart sounds: Heart sounds not distant. No murmur. No friction rub. No gallop.      Comments: No peripheral edema Pulmonary:     Effort: Pulmonary effort is normal. No respiratory distress.     Breath sounds: Normal breath sounds.  Skin:    General:  Skin is warm and dry.     Findings: No rash.  Psychiatric:        Speech: Speech normal.        Behavior: Behavior normal.        Thought Content: Thought content normal.      Assessment and Plan   Postbulbar duodenal ulcer Most likely cause of anemia.  On PPI. HAs follow up with GI.  Anemia  Re-eval for further drop. Continue iron 325 mg TID.  Chronic kidney disease (CKD), stage III (moderate) (HCC) Re-eval... at discharge was back in nml range.  Chest pain Neg Cardiology work up. No further chest pain.     [name], MD

## 2018-12-04 NOTE — Assessment & Plan Note (Signed)
Neg Cardiology work up. No further chest pain.

## 2018-12-04 NOTE — Assessment & Plan Note (Signed)
Re-eval for further drop. Continue iron 325 mg TID.

## 2018-12-04 NOTE — Assessment & Plan Note (Signed)
Re-eval... at discharge was back in nml range.

## 2018-12-04 NOTE — Patient Instructions (Addendum)
Continue Protonix and keep follow up GI.  Push fluids. Continue three times a day iron.  Please stop at the lab to have labs drawn.

## 2018-12-04 NOTE — Assessment & Plan Note (Signed)
Most likely cause of anemia.  On PPI. HAs follow up with GI.

## 2018-12-10 NOTE — Op Note (Signed)
[facility] Gastroenterology Patient Name: Shawn Lam Procedure Date: 12/01/2018 7:49 AM MRN: [id number] Account #: [id number] Date of Birth: 09-14-1950 Admit Type: Inpatient Age: 68 Room: [facility] ENDO ROOM 4 Gender: Male Note Status: Finalized Procedure:            Upper GI endoscopy Indications:          Hematemesis Providers:            Lucilla Lame MD, MD Referring MD:         Jinny Sanders MD, MD (Referring MD) Medicines:            Propofol per Anesthesia Complications:        No immediate complications. Procedure:            Pre-Anesthesia Assessment:                       - Prior to the procedure, a History and Physical was                        performed, and patient medications and allergies were                        reviewed. The patient's tolerance of previous                        anesthesia was also reviewed. The risks and benefits of                        the procedure and the sedation options and risks were                        discussed with the patient. All questions were                        answered, and informed consent was obtained. Prior                        Anticoagulants: The patient has taken no previous                        anticoagulant or antiplatelet agents. ASA Grade                        Assessment: II - A patient with mild systemic disease.                        After reviewing the risks and benefits, the patient was                        deemed in satisfactory condition to undergo the                        procedure.                       After obtaining informed consent, the endoscope was                        passed under direct vision. Throughout the procedure,  the patient's blood pressure, pulse, and oxygen                        saturations were monitored continuously. The Endoscope                        was introduced through the mouth, and advanced to the   second part of duodenum. The upper GI endoscopy was                        accomplished without difficulty. The patient tolerated                        the procedure well. Findings:      The examined esophagus was normal.      The entire examined stomach was normal.      One non-bleeding cratered duodenal ulcer with no stigmata of bleeding       was found in the first portion of the duodenum. Impression:           - Normal esophagus.                       - Normal stomach.                       - Non-bleeding duodenal ulcer with no stigmata of                        bleeding.                       - No specimens collected. Recommendation:       - Return patient to hospital ward for ongoing care.                       - Mechanical soft diet.                       - Continue present medications. Procedure Code(s):    --- Professional ---                       [id number], Esophagogastroduodenoscopy, flexible, transoral;                        diagnostic, including collection of specimen(s) by                        brushing or washing, when performed (separate procedure) Diagnosis Code(s):    --- Professional ---                       K92.0, Hematemesis                       K26.9, Duodenal ulcer, unspecified as acute or chronic,                        without hemorrhage or perforation CPT copyright 2019 [employer]. All rights reserved. The codes documented in this report are preliminary and upon coder review may  be revised to meet current compliance requirements. Lucilla Lame MD, MD 12/01/2018 8:27:46 AM This report has been signed electronically. Number of  Addenda: 0 Note Initiated On: 12/01/2018 7:49 AM Estimated Blood Loss: Estimated blood loss: none.      [facility]

## 2018-12-19 DIAGNOSIS — M25522 Pain in left elbow: Secondary | ICD-10-CM | POA: Diagnosis not present

## 2018-12-24 ENCOUNTER — Ambulatory Visit (INDEPENDENT_AMBULATORY_CARE_PROVIDER_SITE_OTHER): Payer: Medicare Other | Admitting: *Deleted

## 2018-12-24 DIAGNOSIS — R002 Palpitations: Secondary | ICD-10-CM | POA: Diagnosis not present

## 2018-12-24 LAB — CUP PACEART REMOTE DEVICE CHECK
Date Time Interrogation Session: 20200914173602
Implantable Pulse Generator Implant Date: 20180202

## 2018-12-26 ENCOUNTER — Encounter: Payer: Self-pay | Admitting: Gastroenterology

## 2018-12-26 ENCOUNTER — Ambulatory Visit (INDEPENDENT_AMBULATORY_CARE_PROVIDER_SITE_OTHER): Payer: Medicare Other | Admitting: Gastroenterology

## 2018-12-26 ENCOUNTER — Other Ambulatory Visit: Payer: Self-pay

## 2018-12-26 VITALS — BP 104/65 | HR 75 | Temp 97.9°F | Ht 72.0 in | Wt 178.2 lb

## 2018-12-26 DIAGNOSIS — D500 Iron deficiency anemia secondary to blood loss (chronic): Secondary | ICD-10-CM

## 2018-12-26 NOTE — Progress Notes (Signed)
Primary Care Physician: Jinny Sanders, MD  Primary Gastroenterologist:  Dr. Lucilla Lame  Chief Complaint  Patient presents with   Follow-up    HPI: Shawn Lam is a 68 y.o. male here for following discharge from hospital.  The patient was admitted with melena and had an EGD.  The patient reported that he has been taking diclofenac 1 to 2 tablets a day for his arthritis for many months.  The patient underwent an upper endoscopy that showed him to have a duodenal ulcer.  The ulcer was a clean-based ulcer without any sign active bleeding or visible vessel. The patient reports that he is now constipated and has been taking iron for years.  His stools are still black without any sign of bleeding.  He has not had a blood work in the last 3 weeks and his last hemoglobin was low at 8.3.   Current Outpatient Medications  Medication Sig Dispense Refill   alendronate (FOSAMAX) 70 MG tablet Take 1 tablet (70 mg total) by mouth every 7 (seven) days. Take with a full glass of water on an empty stomach. 4 tablet 11   atorvastatin (LIPITOR) 80 MG tablet Take 1 tablet (80 mg total) by mouth daily. 90 tablet 3   clopidogrel (PLAVIX) 75 MG tablet TAKE 1 TABLET DAILY 90 tablet 0   cyclobenzaprine (FLEXERIL) 10 MG tablet TAKE 1/2-1 TAB BY MOUTH AT BEDTIME AS NEEDED FOR MUSCLE SPASMS 30 tablet 0   ferrous sulfate 325 (65 FE) MG tablet TAKE 1 TABLET BY MOUTH 3 TIMES DAILY WITH MEALS. 270 tablet 3   FLUoxetine (PROZAC) 20 MG capsule TAKE 1 CAPSULE DAILY. TAKE WITH 40MG  TO EQUAL 60MG     ONCE DAILY 90 capsule 1   FLUoxetine (PROZAC) 40 MG capsule TAKE 1 CAPSULE EVERY       MORNING 90 capsule 1   furosemide (LASIX) 20 MG tablet TAKE 1 TABLET DAILY 90 tablet 3   hydrOXYzine (ATARAX/VISTARIL) 25 MG tablet TAKE 1 TABLET BY MOUTH AT BEDTIME MAY MAKE DROWSY  3   levothyroxine (SYNTHROID, LEVOTHROID) 25 MCG tablet Take 1 tablet (25 mcg total) by mouth daily before breakfast. 90 tablet 3   midodrine  (PROAMATINE) 5 MG tablet TAKE 1 TABLET (5 MG TOTAL) BY MOUTH 2 (TWO) TIMES DAILY WITH BREAKFAST AND LUNCH. 180 tablet 3   mirtazapine (REMERON) 15 MG tablet TAKE 1 TABLET BY MOUTH EVERYDAY AT BEDTIME 90 tablet 1   nitroGLYCERIN (NITROSTAT) 0.4 MG SL tablet Place 1 tablet (0.4 mg total) under the tongue every 5 (five) minutes as needed for chest pain. 10 tablet 1   pantoprazole (PROTONIX) 40 MG tablet Take 1 tablet (40 mg total) by mouth 2 (two) times daily. 60 tablet 1   vitamin B-12 (CYANOCOBALAMIN) 1000 MCG tablet Take 1,000 mcg by mouth daily.      zolpidem (AMBIEN) 5 MG tablet TAKE 1 TABLET BY MOUTH AT BEDTIME AS NEEDED FOR SLEEP     No current facility-administered medications for this visit.     Allergies as of 12/26/2018 - Review Complete 12/26/2018  Allergen Reaction Noted   Codeine Nausea Only 06/05/2007   Tape Other (See Comments) 03/19/2014    ROS:  General: Negative for anorexia, weight loss, fever, chills, fatigue, weakness. ENT: Negative for hoarseness, difficulty swallowing , nasal congestion. CV: Negative for chest pain, angina, palpitations, dyspnea on exertion, peripheral edema.  Respiratory: Negative for dyspnea at rest, dyspnea on exertion, cough, sputum, wheezing.  GI: See history of  present illness. GU:  Negative for dysuria, hematuria, urinary incontinence, urinary frequency, nocturnal urination.  Endo: Negative for unusual weight change.    Physical Examination:   BP 104/65    Pulse 75    Temp 97.9 F (36.6 C) (Temporal)    Ht 6' (1.829 m)    Wt 178 lb 3.2 oz (80.8 kg)    BMI 24.17 kg/m   General: Well-nourished, well-developed in no acute distress.  Eyes: No icterus. Conjunctivae pink. Mouth: Oropharyngeal mucosa moist and pink , no lesions erythema or exudate. Lungs: Clear to auscultation bilaterally. Non-labored. Heart: Regular rate and rhythm, no murmurs rubs or gallops.  Abdomen: Bowel sounds are normal, nontender, nondistended, no  hepatosplenomegaly or masses, no abdominal bruits or hernia , no rebound or guarding.   Extremities: No lower extremity edema. No clubbing or deformities. Neuro: Alert and oriented x 3.  Grossly intact. Skin: Warm and dry, no jaundice.   Psych: Alert and cooperative, normal mood and affect.  Labs:    Imaging Studies: Dg Chest 1 View  Result Date: 11/30/2018 CLINICAL DATA:  68 year old male with a history dyspnea EXAM: CHEST  1 VIEW COMPARISON:  November 29, 2018, CT chest November 28, 2018 FINDINGS: Cardiomediastinal silhouette unchanged in size and contour. Unchanged position of right chest wall vagal nerve stimulator and the event recorder on the left chest wall. No pneumothorax. No pleural effusion. No confluent airspace disease. Coarsened interstitial markings. IMPRESSION: Negative for acute cardiopulmonary disease with background of chronic changes. Electronically Signed   By: Corrie Mckusick D.O.   On: 11/30/2018 14:17   Dg Chest 2 View  Result Date: 11/29/2018 CLINICAL DATA:  Syncope today. Code blue called, pt unresponsive, cardiac arrest. Pt states concern for internal bleeding. Hx of CAD. Nonsmoker. EXAM: CHEST - 2 VIEW COMPARISON:  11/28/2018 FINDINGS: Lungs are clear. Heart size normal. Monitoring device overlies the left hemithorax. Generator device projects over the right upper chest as before. No effusion. T12 compression deformity, post cement augmentation. IMPRESSION: No acute cardiopulmonary disease. Electronically Signed   By: Lucrezia Europe M.D.   On: 11/29/2018 17:00   Ct Head Wo Contrast  Result Date: 11/29/2018 CLINICAL DATA:  Encephalopathy. Syncopal episode in the emergency room. Negative stress test this morning. EXAM: CT HEAD WITHOUT CONTRAST TECHNIQUE: Contiguous axial images were obtained from the base of the skull through the vertex without intravenous contrast. COMPARISON:  None. FINDINGS: Brain: Ventricles, cisterns and other CSF spaces are normal. There is no mass, mass  effect, shift of midline structures or acute hemorrhage. No evidence of acute infarction. Vascular: No hyperdense vessel or unexpected calcification. Skull: Normal. Negative for fracture or focal lesion. Sinuses/Orbits: Orbits are normal. Paranasal sinuses are well developed as there is small air-fluid level over the left sphenoid sinus. Mastoid air cells are clear. Other: None. IMPRESSION: No acute findings. Subtle air-fluid level over the left sphenoid sinus. Electronically Signed   By: Marin Olp M.D.   On: 11/29/2018 16:48   Ct Angio Chest Pe W And/or Wo Contrast  Result Date: 11/28/2018 CLINICAL DATA:  Chest pressure, shortness of breath EXAM: CT ANGIOGRAPHY CHEST WITH CONTRAST TECHNIQUE: Multidetector CT imaging of the chest was performed using the standard protocol during bolus administration of intravenous contrast. Multiplanar CT image reconstructions and MIPs were obtained to evaluate the vascular anatomy. CONTRAST:  41mL OMNIPAQUE IOHEXOL 350 MG/ML SOLN COMPARISON:  06/20/2013, 02/09/2018 FINDINGS: Cardiovascular: Satisfactory opacification of the pulmonary arteries to the segmental level. No evidence of pulmonary embolism. Normal heart  size. No pericardial effusion. Mediastinum/Nodes: No enlarged mediastinal, hilar, or axillary lymph nodes. Thyroid gland, trachea, and esophagus demonstrate no significant findings. Lungs/Pleura: Chronic posterior biapical pleuroparenchymal scarring. Lungs are otherwise clear. No focal airspace consolidation, pleural effusion, or pneumothorax. Upper Abdomen: No acute abnormality. Musculoskeletal: Right chest wall stimulator. Left chest wall loop recorder. Chronic compression deformity T12 status post cement augmentation. Additional chronic mild superior endplate compression deformities of T7, T8, and T9, which were apparent on prior x-ray 02/09/2018. No new or acute osseous abnormality identified. Review of the MIP images confirms the above findings. IMPRESSION: 1.  Negative for PE.  Lungs are clear. 2. Multiple chronic thoracic spine vertebral body compression deformities, not significantly changed in appearance compared to 02/09/2018. Electronically Signed   By: Davina Poke M.D.   On: 11/28/2018 10:28   Nm Myocar Multi W/spect W/wall Motion / Ef  Result Date: 11/29/2018  There was no ST segment deviation noted during stress.  No T wave inversion was noted during stress.  The study is normal.  This is a low risk study.  The left ventricular ejection fraction is normal (55-65%).    Dg Chest Port 1 View  Result Date: 11/28/2018 CLINICAL DATA:  Chest pain. EXAM: PORTABLE CHEST 1 VIEW COMPARISON:  Radiographs of December 08, 2017. FINDINGS: The heart size and mediastinal contours are within normal limits. Both lungs are clear. No pneumothorax or pleural effusion is noted. Stable position of stimulator seen over right chest. The visualized skeletal structures are unremarkable. IMPRESSION: No active disease. Electronically Signed   By: Marijo Conception M.D.   On: 11/28/2018 08:35   Dg Abd Portable 1v  Result Date: 11/30/2018 CLINICAL DATA:  Abdominal pain and vomiting. EXAM: PORTABLE ABDOMEN - 1 VIEW COMPARISON:  Abdominal x-ray 08/07/2018 FINDINGS: No bowel dilatation to suggest obstruction. Small to moderate colonic stool burden. No evidence of free air. No radiopaque calculi or abnormal soft tissue calcifications. Overlying monitoring devices. Chronic compression fractures in the lower thoracic and upper lumbar spine. IMPRESSION: Normal bowel gas pattern. Small to moderate colonic stool burden. Electronically Signed   By: Keith Rake M.D.   On: 11/30/2018 02:18    Assessment and Plan:   MERLE CIRELLI is a 68 y.o. y/o male who comes in today with a history of a GI bleed with melena and a drop in his hemoglobin.  The patient has had no further sign of bleeding and feels well.  The patient also states that he is due for a screening colonoscopy.  He  also needs a refill of his Protonix.  The patient will have his hemoglobin checked.  He is presently on Plavix and has been on Plavix for years.  Prior to having a colonoscopy he will need to stop the iron and his Plavix.  This will be coordinated through his cardiologist.  The patient will also have his blood sent for H. pylori.  The patient has been explained the plan and agrees with it.    Lucilla Lame, MD. Marval Regal   Note: This dictation was prepared with Dragon dictation along with smaller phrase technology. Any transcriptional errors that result from this process are unintentional.

## 2018-12-27 LAB — CBC WITH DIFFERENTIAL/PLATELET
Basophils Absolute: 0.1 10*3/uL (ref 0.0–0.2)
Basos: 2 %
EOS (ABSOLUTE): 0.3 10*3/uL (ref 0.0–0.4)
Eos: 7 %
Hematocrit: 37.1 % — ABNORMAL LOW (ref 37.5–51.0)
Hemoglobin: 12 g/dL — ABNORMAL LOW (ref 13.0–17.7)
Immature Grans (Abs): 0 10*3/uL (ref 0.0–0.1)
Immature Granulocytes: 0 %
Lymphocytes Absolute: 1 10*3/uL (ref 0.7–3.1)
Lymphs: 25 %
MCH: 32.4 pg (ref 26.6–33.0)
MCHC: 32.3 g/dL (ref 31.5–35.7)
MCV: 100 fL — ABNORMAL HIGH (ref 79–97)
Monocytes Absolute: 0.3 10*3/uL (ref 0.1–0.9)
Monocytes: 9 %
Neutrophils Absolute: 2.3 10*3/uL (ref 1.4–7.0)
Neutrophils: 57 %
Platelets: 238 10*3/uL (ref 150–450)
RBC: 3.7 x10E6/uL — ABNORMAL LOW (ref 4.14–5.80)
RDW: 14.2 % (ref 11.6–15.4)
WBC: 4 10*3/uL (ref 3.4–10.8)

## 2018-12-27 LAB — H. PYLORI ANTIBODY, IGG: H. pylori, IgG AbS: 0.1 Index Value (ref 0.00–0.79)

## 2018-12-28 DIAGNOSIS — S76112A Strain of left quadriceps muscle, fascia and tendon, initial encounter: Secondary | ICD-10-CM | POA: Diagnosis not present

## 2018-12-28 DIAGNOSIS — M25522 Pain in left elbow: Secondary | ICD-10-CM | POA: Diagnosis not present

## 2018-12-31 ENCOUNTER — Telehealth: Payer: Self-pay | Admitting: Gastroenterology

## 2018-12-31 NOTE — Telephone Encounter (Signed)
Pt left vm to speak with Ginger °

## 2019-01-03 ENCOUNTER — Other Ambulatory Visit: Payer: Self-pay

## 2019-01-03 ENCOUNTER — Telehealth: Payer: Self-pay

## 2019-01-03 ENCOUNTER — Other Ambulatory Visit: Payer: Self-pay | Admitting: Family Medicine

## 2019-01-03 DIAGNOSIS — Z1211 Encounter for screening for malignant neoplasm of colon: Secondary | ICD-10-CM

## 2019-01-03 NOTE — Telephone Encounter (Signed)
-----   Message from Lucilla Lame, MD sent at 12/27/2018  8:24 AM EDT ----- Let the patient know that his labs are significantly better and is now safe level to undergo a colonoscopy once we contact his prescriber for his anticoagulation to see how long it can be stopped for.  The blood test for H. pylori is still pending and he will be notified when that is back.

## 2019-01-03 NOTE — Telephone Encounter (Signed)
Dr. Radford Pax Request received to hold plavix for colonoscopy. Pt had remote stents to LAD and RCA, both patent by heart cath in 2015, normal stress test in 2017, normal EF by echo 2019. He was recently hospitalized with chest pain and anemia. Stress test 11/29/18 was low risk. EGD negative in the setting of GI bleed. He is now scheduled for colonoscopy. OK to hold plavix?

## 2019-01-03 NOTE — Telephone Encounter (Signed)
   [facility] Pre-operative Risk Assessment    Request for surgical clearance:  1. What type of surgery is being performed? Colonoscopy   2. When is this surgery scheduled? 01/21/19   3. What type of clearance is required (medical clearance vs. Pharmacy clearance to hold med vs. Both)? Pharmacy  4. Are there any medications that need to be held prior to surgery and how long? Plavix   5. Practice name and name of physician performing surgery? [facility]/Dr. Lucilla Lame   6. What is your office phone number [phone]    7.   What is your office fax number [phone] Attn: Ginger Feldpausch, CMA  8.   Anesthesia type (None, local, MAC, general) ? None listed   Shawn Lam 01/03/2019, 11:48 AM  _________________________________________________________________   (provider comments below)

## 2019-01-03 NOTE — Telephone Encounter (Signed)
Pt notified of results and scheduled for a colonoscopy on 01/21/19.

## 2019-01-04 NOTE — Progress Notes (Signed)
[facility] Summary Report / Loop Recorder 

## 2019-01-07 NOTE — Telephone Encounter (Signed)
   Primary Cardiologist: Fransico Him, MD  Chart reviewed as part of pre-operative protocol coverage. Request received regarding holding antiplatelet agents for procedure.  Pt had remote stents to LAD and RCA, both patent by heart cath in 2015, normal stress test in 2017, normal EF by echo 2019. He was recently hospitalized with chest pain and anemia. Stress test 11/29/18 was low risk. Per Dr. Radford Pax, he may hold plavix 5-7 days prior to his colonoscopy.   I will route this recommendation to the requesting party via Epic fax function and remove from pre-op pool.  Please call with questions.  [place], PA 01/07/2019, 9:07 AM

## 2019-01-07 NOTE — Telephone Encounter (Signed)
Spoke to pt and gave recommendations. Pt verbalized thanks and understanding.

## 2019-01-07 NOTE — Telephone Encounter (Signed)
yes

## 2019-01-11 NOTE — Progress Notes (Signed)
Cardiology Office Note  Date:  01/14/2019   ID:  Shawn Lam, DOB Apr 19, 1950, MRN [id number]  PCP:  Jinny Sanders, MD   Chief Complaint  Patient presents with  . other    Former Dr. Radford Pax patient; establish care. Pt. was at [facility]; 11/2018 with chest pain & syncope due to anemia. Meds reviewed by the pt. verbally. "doing well."  Pt. c/o numbness in feet.     HPI:  Shawn Lam is a 68 y.o. male with a hx of  CAD s/p PCI of the LAD and RCA,  orthostatic hypotension,  dyslipidemia EF of 50-55% with inferior HK.  lymphedema  mildly dilated aortic root at 38 mm by echo 2018,  moderate OSA Who presents for routine follow-up of his coronary artery disease  In the hospital 11/2018 GI bleeding and chest pain vasovagal episode 2 days ago in the setting of GI bleed and chronic hypotension EGD with no complications this morning Case discussed with Dr. Allen Norris in detail, Clean-based ulcer no signs of clot or bleeding continue Florinef and midodrine at outpatient   On today's visit reports that he continues to have near syncope Fall from dizzy spell 3 weeks ago Fractured left forearm  Previous trial of CPAP but was intolerant and then was referred to ENT for consideration of inspire device.  On 12/08/2017 he underwent placement of inspire device without complication.    EKG personally reviewed by myself on todays visit Shows normal sinus rhythm with rate 65 bpm no significant ST-T wave changes  PMH:   has a past medical history of Anemia, Anxiety, Asymptomatic LV dysfunction, Berger's disease, CAD (coronary artery disease) (06/2005), Depression, Dilated aortic root ([place]), Hematuria, Hyperlipidemia, Hypothyroidism, Insomnia, Left ureteral stone, Orthostatic hypotension, OSA (obstructive sleep apnea), PVC's (premature ventricular contractions) (07/07/2016), Renal disorder, Rosacea, and Urticaria.  PSH:    Past Surgical History:  Procedure Laterality Date  . CARDIAC CATHETERIZATION   09/06/2013   [place]  . CARDIAC CATHETERIZATION    . CORONARY ANGIOPLASTY  2004   STENT PLACEMENT  . CYSTOSCOPY WITH RETROGRADE PYELOGRAM, URETEROSCOPY AND STENT PLACEMENT Left 03/19/2014   Procedure: CYSTOSCOPY WITH RETROGRADE PYELOGRAM, URETEROSCOPY AND STENT PLACEMENT;  Surgeon: Junious Dresser, MD;  Location: [facility];  Service: Urology;  Laterality: Left;  . CYSTOSCOPY WITH RETROGRADE PYELOGRAM, URETEROSCOPY AND STENT PLACEMENT Bilateral 05/20/2015   Procedure:  CYSTOSCOPY WITH BILATERAL RETROGRADE PYELOGRAM,RIGHT  DIAGNOSTIC URETEROSCOPY ,LEFT URETEROSCOPY WITH HOLMIUM LASER  AND BILATERAL  STENT PLACEMENT ;  Surgeon: Alexis Frock, MD;  Location: [facility];  Service: Urology;  Laterality: Bilateral;  . DRUG INDUCED ENDOSCOPY N/A 10/20/2017   Procedure: DRUG INDUCED SLEEP ENDOSCOPY;  Surgeon: Melida Quitter, MD;  Location: [place];  Service: ENT;  Laterality: N/A;  . EP IMPLANTABLE DEVICE N/A 05/13/2016   Procedure: Loop Recorder Insertion;  Surgeon: Deboraha Sprang, MD;  Location: [place] [facility];  Service: Cardiovascular;  Laterality: N/A;  . ESOPHAGOGASTRODUODENOSCOPY (EGD) WITH PROPOFOL N/A 11/30/2018   Procedure: ESOPHAGOGASTRODUODENOSCOPY (EGD) WITH PROPOFOL;  Surgeon: Jonathon Bellows, MD;  Location: [facility] END[facility]COPY;  Service: Gastroenterology;  Laterality: N/A;  . ESOPHAGOGASTRODUODENOSCOPY (EGD) WITH PROPOFOL N/A 12/01/2018   Procedure: ESOPHAGOGASTRODUODENOSCOPY (EGD) WITH PROPOFOL;  Surgeon: Lucilla Lame, MD;  Location: [facility] ENDOSCOPY;  Service: Endoscopy;  Laterality: N/A;  . HOLMIUM LASER APPLICATION Bilateral 11/13/4625   Procedure: HOLMIUM LASER APPLICATION;  Surgeon: Alexis Frock, MD;  Location: [facility];  Service: Urology;  Laterality: Bilateral;  . IMPLIMENTATION OF A HYPOGLOSSAL NERVE  STIMULATOR  12/08/2017   OSA   . LEFT HEART CATHETERIZATION WITH CORONARY ANGIOGRAM N/A 09/06/2013   Procedure: LEFT HEART CATHETERIZATION WITH CORONARY ANGIOGRAM;  Surgeon: Sinclair Grooms, MD;  Location[facility];  Service: Cardiovascular;  Laterality: N/A;  . LEFT KNEE CAP  SURGERY ABOUT 40 YRS AGO  ? 1970    . STONE EXTRACTION WITH BASKET Left 03/19/2014   Procedure: STONE EXTRACTION WITH BASKET;  Surgeon: Junious Dresser, MD;  Location: [facility];  Service: Urology;  Laterality: Left;    Current Outpatient Medications  Medication Sig Dispense Refill  . alendronate (FOSAMAX) 70 MG tablet Take 1 tablet (70 mg total) by mouth every 7 (seven) days. Take with a full glass of water on an empty stomach. 4 tablet 11  . atorvastatin (LIPITOR) 80 MG tablet Take 1 tablet (80 mg total) by mouth daily. 90 tablet 3  . clopidogrel (PLAVIX) 75 MG tablet TAKE 1 TABLET DAILY 90 tablet 0  . ferrous sulfate 325 (65 FE) MG tablet TAKE 1 TABLET BY MOUTH 3 TIMES DAILY WITH MEALS. 270 tablet 3  . FLUoxetine (PROZAC) 20 MG capsule TAKE 1 CAPSULE DAILY. TAKE WITH 40MG  TO EQUAL 60MG     ONCE DAILY 90 capsule 1  . FLUoxetine (PROZAC) 40 MG capsule TAKE 1 CAPSULE EVERY       MORNING 90 capsule 1  . furosemide (LASIX) 20 MG tablet TAKE 1 TABLET DAILY 90 tablet 3  . hydrOXYzine (ATARAX/VISTARIL) 25 MG tablet TAKE 1 TABLET BY MOUTH AT BEDTIME MAY MAKE DROWSY  3  . levothyroxine (SYNTHROID, LEVOTHROID) 25 MCG tablet Take 1 tablet (25 mcg total) by mouth daily before breakfast. 90 tablet 3  . midodrine (PROAMATINE) 5 MG tablet TAKE 1 TABLET (5 MG TOTAL) BY MOUTH 2 (TWO) TIMES DAILY WITH BREAKFAST AND LUNCH. 180 tablet 3  . mirtazapine (REMERON) 15 MG tablet TAKE 1 TABLET BY MOUTH EVERYDAY AT BEDTIME 90 tablet 1  . nitroGLYCERIN (NITROSTAT) 0.4 MG SL tablet Place 1 tablet (0.4 mg total) under the tongue every 5 (five) minutes as needed for chest pain. 10 tablet 1  . pantoprazole (PROTONIX) 40 MG tablet Take 1 tablet (40 mg total) by mouth 2 (two) times daily. 60 tablet 1  . vitamin B-12 (CYANOCOBALAMIN) 1000 MCG tablet Take 1,000 mcg by mouth daily.     [name] zolpidem (AMBIEN) 5 MG tablet TAKE 1 TABLET BY  MOUTH AT BEDTIME AS NEEDED FOR SLEEP    . cyclobenzaprine (FLEXERIL) 10 MG tablet TAKE 1/2-1 TAB BY MOUTH AT BEDTIME AS NEEDED FOR MUSCLE SPASMS 30 tablet 0   No current facility-administered medications for this visit.      Allergies:   Codeine and Tape   Social History:  The patient  reports that he has never smoked. He has never used smokeless tobacco. He reports current alcohol use of about 1.0 standard drinks of alcohol per week. He reports that he does not use drugs.   Family History:   family history includes Cancer in his father; Coronary artery disease in his mother; Diabetes in his brother; Heart attack in his mother; Heart disease in his mother; Hypertension in his mother; Lung cancer in his father.    Review of Systems: Review of Systems  Constitutional: Negative.   HENT: Negative.   Respiratory: Negative.   Cardiovascular: Negative.   Gastrointestinal: Negative.   Musculoskeletal: Negative.   Neurological: Positive for dizziness.  Psychiatric/Behavioral: Negative.   All other systems reviewed and  are negative.   PHYSICAL EXAM: VS:  BP 130/68 (BP Location: Left Arm, Patient Position: Sitting, Cuff Size: Normal)   Pulse 65   Temp (!) 97.2 F (36.2 C)   Ht 6' (1.829 m)   Wt 181 lb 8 oz (82.3 kg)   BMI 24.62 kg/m  , BMI Body mass index is 24.62 kg/m. GEN: Well nourished, well developed, in no acute distress HEENT: normal Neck: no JVD, carotid bruits, or masses Cardiac: RRR; no murmurs, rubs, or gallops,no edema  Respiratory:  clear to auscultation bilaterally, normal work of breathing GI: soft, nontender, nondistended, + BS MS: no deformity or atrophy Skin: warm and dry, no rash Neuro:  Strength and sensation are intact Psych: euthymic mood, full affect   Recent Labs: 05/24/2018: ALT 21 11/29/2018: TSH 15.427 12/01/2018: Magnesium 2.0 12/04/2018: BUN 19; Creatinine, Ser 1.42; Potassium 3.5; Sodium 139 12/26/2018: Hemoglobin 12.0; Platelets 238    Lipid  Panel Lab Results  Component Value Date   CHOL 146 05/24/2018   HDL 61.90 05/24/2018   LDLCALC 75 05/24/2018   TRIG 48.0 05/24/2018      Wt Readings from Last 3 Encounters:  01/14/19 181 lb 8 oz (82.3 kg)  12/26/18 178 lb 3.2 oz (80.8 kg)  12/04/18 179 lb (81.2 kg)      ASSESSMENT AND PLAN:  Problem List Items Addressed This Visit      Cardiology Problems   CAD (coronary artery disease) - Primary   Relevant Medications   midodrine (PROAMATINE) 5 MG tablet   Other Relevant Orders   EKG 12-Lead   Orthostatic hypotension   Relevant Medications   midodrine (PROAMATINE) 5 MG tablet   Other Relevant Orders   EKG 12-Lead     Other   OSA (obstructive sleep apnea)    Other Visit Diagnoses    Hypercholesterolemia       Relevant Medications   midodrine (PROAMATINE) 5 MG tablet      1.  OSA -   intolerant to CPAP therapy had the inspire device implanted in August of this year.  Stable  2. CAD -   s/p remote PCI of the LAD and RCA.   Currently with no symptoms of angina. No further workup at this time. Continue current medication regimen.  3.  Orthostatic hypotension  Recommended he increase midodrine up to 10 mg 3 times daily rather than 5 mg given recent near syncope, fall, fracture of his arm Increase fluids, Consider decreasing Lasix down to every other day Possibly may be able to wean Lasix down to as needed for ankle swelling  4.  Hyperlipidemia - Cholesterol is at goal on the current lipid regimen. No changes to the medications were made.   Disposition:   F/U  6 months   Total encounter time more than 25 minutes  Greater than 50% was spent in counseling and coordination of care with the patient    Signed, Esmond Plants, M.D., Ph.D. [place], [place]

## 2019-01-13 ENCOUNTER — Other Ambulatory Visit: Payer: Self-pay | Admitting: Family Medicine

## 2019-01-14 ENCOUNTER — Telehealth: Payer: Self-pay

## 2019-01-14 ENCOUNTER — Encounter: Payer: Self-pay | Admitting: Cardiovascular Disease

## 2019-01-14 ENCOUNTER — Other Ambulatory Visit: Payer: Self-pay

## 2019-01-14 ENCOUNTER — Ambulatory Visit (INDEPENDENT_AMBULATORY_CARE_PROVIDER_SITE_OTHER): Payer: Medicare Other | Admitting: Cardiovascular Disease

## 2019-01-14 VITALS — BP 130/68 | HR 65 | Temp 97.2°F | Ht 72.0 in | Wt 181.5 lb

## 2019-01-14 DIAGNOSIS — G4733 Obstructive sleep apnea (adult) (pediatric): Secondary | ICD-10-CM | POA: Diagnosis not present

## 2019-01-14 DIAGNOSIS — I25118 Atherosclerotic heart disease of native coronary artery with other forms of angina pectoris: Secondary | ICD-10-CM

## 2019-01-14 DIAGNOSIS — E7800 Pure hypercholesterolemia, unspecified: Secondary | ICD-10-CM | POA: Diagnosis not present

## 2019-01-14 DIAGNOSIS — I951 Orthostatic hypotension: Secondary | ICD-10-CM | POA: Diagnosis not present

## 2019-01-14 MED ORDER — MIDODRINE HCL 5 MG PO TABS: ORAL_TABLET | ORAL | 3 refills | Status: DC

## 2019-01-14 MED ORDER — MIDODRINE HCL 10 MG PO TABS: ORAL_TABLET | ORAL | 3 refills | Status: DC

## 2019-01-14 NOTE — Patient Instructions (Addendum)
Medication Instructions:  Please increase the midodrine up to 10 mg three times a day 90 days worth, #270 pills, with refills [facility]  If you get dizzy spells, Hold the lasix for a day, ok to try every other day on the lasix  Compression hose  If you need a refill on your cardiac medications before your next appointment, please call your pharmacy.    Lab work: No new labs needed   If you have labs (blood work) drawn today and your tests are completely normal, you will receive your results only by: [name] MyChart Message (if you have MyChart) OR . A paper copy in the mail If you have any lab test that is abnormal or we need to change your treatment, we will call you to review the results.   Testing/Procedures: No new testing needed   Follow-Up: At [facility], you and your health needs are our priority.  As part of our continuing mission to provide you with exceptional heart care, we have created designated Provider Care Teams.  These Care Teams include your primary Cardiologist (physician) and Advanced Practice Providers (APPs -  Physician Assistants and Nurse Practitioners) who all work together to provide you with the care you need, when you need it.  . You will need a follow up appointment in 6 months .   Please call our office 2 months in advance to schedule this appointment.    . Providers on your designated Care Team:   . Murray Hodgkins, NP . Christell Faith, PA-C . Marrianne Mood, PA-C  Any Other Special Instructions Will Be Listed Below (If Applicable).  For educational health videos Log in to : [email or web address] Or : [email or web address], password : [email or web address]

## 2019-01-14 NOTE — Telephone Encounter (Signed)
Rx for Midodrine sent to [facility] in error. Contacted [facility] and advised them to disregard the e-scribed Rx.

## 2019-01-14 NOTE — Addendum Note (Signed)
Addended by: Minna Merritts on: 01/14/2019 01:49 PM   Modules accepted: Orders

## 2019-01-14 NOTE — Telephone Encounter (Signed)
Last office visit 12/04/2018 for hospital follow up.  Last refilled 11/01/2018 for #30 with no refills. CPE scheduled for 02/26/2019.

## 2019-01-15 ENCOUNTER — Encounter: Payer: Self-pay | Admitting: *Deleted

## 2019-01-15 ENCOUNTER — Other Ambulatory Visit: Payer: Self-pay

## 2019-01-15 NOTE — Anesthesia Preprocedure Evaluation (Addendum)
Anesthesia Evaluation  Patient identified by MRN, date of birth, ID band Patient awake    Reviewed: Allergy & Precautions, H&P , NPO status , Patient's Chart, lab work & pertinent test results  Airway Mallampati: II  TM Distance: >3 FB Neck ROM: full    Dental no notable dental hx.    Pulmonary sleep apnea ,    Pulmonary exam normal breath sounds clear to auscultation       Cardiovascular + CAD and + Peripheral Vascular Disease  Normal cardiovascular exam Rhythm:regular Rate:Normal  10/5 Cardiology note:  HPI- On today's visit reports that he continues to have near syncope Fall from dizzy spell 3 weeks ago Fractured left forearm  A&P:  3. Orthostatic hypotension Recommended he increase midodrine up to 10 mg 3 times daily rather than 5 mg given recent near syncope, fall, fracture of his arm Increase fluids, Consider decreasing Lasix down to every other day Possibly may be able to wean Lasix down to as needed for ankle swelling    Note from [place] on 01/18/19: patient changed medication doses on Tuesday, 01/15/19 and has not had any dizziness since.   Neuro/Psych    GI/Hepatic PUD, GERD  ,  Endo/Other  Hypothyroidism   Renal/GU Renal disease     Musculoskeletal   Abdominal   Peds  Hematology   Anesthesia Other Findings   Reproductive/Obstetrics                           Anesthesia Physical Anesthesia Plan  ASA: III  Anesthesia Plan: General   Post-op Pain Management:    Induction: Intravenous  PONV Risk Score and Plan: 2 and Propofol infusion, Treatment may vary due to age or medical condition and TIVA  Airway Management Planned: Natural Airway  Additional Equipment:   Intra-op Plan:   Post-operative Plan:   Informed Consent: I have reviewed the patients History and Physical, chart, labs and discussed the procedure including the risks, benefits and alternatives for the  proposed anesthesia with the patient or authorized representative who has indicated his/her understanding and acceptance.       Plan Discussed with: CRNA  Anesthesia Plan Comments:         Anesthesia Quick Evaluation

## 2019-01-16 DIAGNOSIS — M25522 Pain in left elbow: Secondary | ICD-10-CM | POA: Diagnosis not present

## 2019-01-17 ENCOUNTER — Other Ambulatory Visit
Admission: RE | Admit: 2019-01-17 | Discharge: 2019-01-17 | Disposition: A | Discharge status: Home / Self Care | Payer: Medicare Other | Source: Ambulatory Visit | Attending: Gastroenterology | Admitting: Gastroenterology

## 2019-01-17 ENCOUNTER — Other Ambulatory Visit: Payer: Self-pay

## 2019-01-17 DIAGNOSIS — Z01812 Encounter for preprocedural laboratory examination: Secondary | ICD-10-CM | POA: Insufficient documentation

## 2019-01-17 DIAGNOSIS — Z20828 Contact with and (suspected) exposure to other viral communicable diseases: Secondary | ICD-10-CM | POA: Insufficient documentation

## 2019-01-17 LAB — SARS CORONAVIRUS 2 (TAT 6-24 HRS): SARS Coronavirus 2: NEGATIVE

## 2019-01-18 NOTE — Discharge Instructions (Signed)

## 2019-01-19 ENCOUNTER — Telehealth: Payer: Self-pay | Admitting: Cardiology

## 2019-01-19 ENCOUNTER — Other Ambulatory Visit: Payer: Self-pay | Admitting: Family Medicine

## 2019-01-20 ENCOUNTER — Other Ambulatory Visit: Payer: Self-pay | Admitting: Family Medicine

## 2019-01-21 ENCOUNTER — Ambulatory Visit: Payer: Medicare Other | Admitting: Anesthesiology

## 2019-01-21 ENCOUNTER — Encounter
Admission: RE | Disposition: A | Discharge status: Home / Self Care | Payer: Self-pay | Source: Home / Self Care | Attending: Gastroenterology

## 2019-01-21 ENCOUNTER — Ambulatory Visit
Admission: RE | Admit: 2019-01-21 | Discharge: 2019-01-21 | Disposition: A | Discharge status: Home / Self Care | Payer: Medicare Other | Attending: Gastroenterology | Admitting: Gastroenterology

## 2019-01-21 ENCOUNTER — Other Ambulatory Visit: Payer: Self-pay

## 2019-01-21 DIAGNOSIS — K5730 Diverticulosis of large intestine without perforation or abscess without bleeding: Secondary | ICD-10-CM | POA: Insufficient documentation

## 2019-01-21 DIAGNOSIS — Z860100 Personal history of colon polyps, unspecified: Secondary | ICD-10-CM

## 2019-01-21 DIAGNOSIS — Z7902 Long term (current) use of antithrombotics/antiplatelets: Secondary | ICD-10-CM | POA: Insufficient documentation

## 2019-01-21 DIAGNOSIS — G4733 Obstructive sleep apnea (adult) (pediatric): Secondary | ICD-10-CM | POA: Diagnosis not present

## 2019-01-21 DIAGNOSIS — E785 Hyperlipidemia, unspecified: Secondary | ICD-10-CM | POA: Insufficient documentation

## 2019-01-21 DIAGNOSIS — Z79890 Hormone replacement therapy (postmenopausal): Secondary | ICD-10-CM | POA: Diagnosis not present

## 2019-01-21 DIAGNOSIS — I739 Peripheral vascular disease, unspecified: Secondary | ICD-10-CM | POA: Diagnosis not present

## 2019-01-21 DIAGNOSIS — Z7983 Long term (current) use of bisphosphonates: Secondary | ICD-10-CM | POA: Diagnosis not present

## 2019-01-21 DIAGNOSIS — E039 Hypothyroidism, unspecified: Secondary | ICD-10-CM | POA: Insufficient documentation

## 2019-01-21 DIAGNOSIS — Z951 Presence of aortocoronary bypass graft: Secondary | ICD-10-CM | POA: Diagnosis not present

## 2019-01-21 DIAGNOSIS — Z7982 Long term (current) use of aspirin: Secondary | ICD-10-CM | POA: Diagnosis not present

## 2019-01-21 DIAGNOSIS — D649 Anemia, unspecified: Secondary | ICD-10-CM | POA: Diagnosis not present

## 2019-01-21 DIAGNOSIS — K640 First degree hemorrhoids: Secondary | ICD-10-CM | POA: Insufficient documentation

## 2019-01-21 DIAGNOSIS — K219 Gastro-esophageal reflux disease without esophagitis: Secondary | ICD-10-CM | POA: Insufficient documentation

## 2019-01-21 DIAGNOSIS — Z1211 Encounter for screening for malignant neoplasm of colon: Secondary | ICD-10-CM | POA: Diagnosis not present

## 2019-01-21 DIAGNOSIS — I2510 Atherosclerotic heart disease of native coronary artery without angina pectoris: Secondary | ICD-10-CM | POA: Insufficient documentation

## 2019-01-21 DIAGNOSIS — Z79899 Other long term (current) drug therapy: Secondary | ICD-10-CM | POA: Diagnosis not present

## 2019-01-21 DIAGNOSIS — F329 Major depressive disorder, single episode, unspecified: Secondary | ICD-10-CM | POA: Diagnosis not present

## 2019-01-21 HISTORY — PX: COLONOSCOPY WITH PROPOFOL: SHX5780

## 2019-01-21 SURGERY — COLONOSCOPY WITH PROPOFOL
Anesthesia: General | Site: Rectum

## 2019-01-21 MED ORDER — PROPOFOL 10 MG/ML IV BOLUS
INTRAVENOUS | Status: DC | PRN
  Administered 2019-01-21 (×3): 100 mg via INTRAVENOUS

## 2019-01-21 MED ORDER — LACTATED RINGERS IV SOLN
INTRAVENOUS | Status: DC
  Administered 2019-01-21: 11:00:00 via INTRAVENOUS

## 2019-01-21 MED ORDER — STERILE WATER FOR IRRIGATION IR SOLN
Status: DC | PRN
  Administered 2019-01-21: 50 mL

## 2019-01-21 MED ORDER — ACETAMINOPHEN 160 MG/5ML PO SOLN: 325.0000 mg | Freq: Once | ORAL | Status: DC

## 2019-01-21 MED ORDER — ACETAMINOPHEN 325 MG PO TABS: 325.0000 mg | ORAL_TABLET | Freq: Once | ORAL | Status: DC

## 2019-01-21 MED ORDER — LIDOCAINE HCL (CARDIAC) PF 100 MG/5ML IV SOSY
PREFILLED_SYRINGE | INTRAVENOUS | Status: DC | PRN
  Administered 2019-01-21: 50 mg via INTRAVENOUS

## 2019-01-21 SURGICAL SUPPLY — 5 items
CANISTER SUCT 1200ML W/VALVE (MISCELLANEOUS) ×3 IMPLANT
GOWN CVR UNV OPN BCK APRN NK (MISCELLANEOUS) ×2 IMPLANT
GOWN ISOL THUMB LOOP REG UNIV (MISCELLANEOUS) ×4
KIT ENDO PROCEDURE OLY (KITS) ×3 IMPLANT
WATER STERILE IRR 250ML POUR (IV SOLUTION) ×3 IMPLANT

## 2019-01-21 NOTE — Anesthesia Postprocedure Evaluation (Signed)
Anesthesia Post Note  Patient: Shawn Lam  Procedure(s) Performed: COLONOSCOPY WITH PROPOFOL (N/A Rectum)  Patient location during evaluation: PACU Anesthesia Type: General Level of consciousness: awake and alert and oriented Pain management: satisfactory to patient Vital Signs Assessment: post-procedure vital signs reviewed and stable Respiratory status: spontaneous breathing, nonlabored ventilation and respiratory function stable Cardiovascular status: blood pressure returned to baseline and stable Postop Assessment: Adequate PO intake and No signs of nausea or vomiting Anesthetic complications: no    Raliegh Ip

## 2019-01-21 NOTE — Transfer of Care (Signed)
Immediate Anesthesia Transfer of Care Note  Patient: Shawn Lam  Procedure(s) Performed: COLONOSCOPY WITH PROPOFOL (N/A Rectum)  Patient Location: PACU  Anesthesia Type: General  Level of Consciousness: awake, alert  and patient cooperative  Airway and Oxygen Therapy: Patient Spontanous Breathing and Patient connected to supplemental oxygen  Post-op Assessment: Post-op Vital signs reviewed, Patient's Cardiovascular Status Stable, Respiratory Function Stable, Patent Airway and No signs of Nausea or vomiting  Post-op Vital Signs: Reviewed and stable  Complications: No apparent anesthesia complications

## 2019-01-21 NOTE — H&P (Signed)
Lucilla Lame, MD [address] [facility] [place], [place] [address] [phone] Fax : [phone]  Primary Care Physician:  Jinny Sanders, MD Primary Gastroenterologist:  Dr. Allen Norris  Pre-Procedure History & Physical: HPI:  Shawn Lam is a 68 y.o. male is here for an colonoscopy.   Past Medical History:  Diagnosis Date  . Anemia   . Anxiety   . Asymptomatic LV dysfunction    50-55% by echo 06/2016  . Berger's disease   . CAD (coronary artery disease) 06/2005   PCI of LAD and RCA  . Depression   . Dilated aortic root (HCC)    4cm at sinus of Valsalva  . Hematuria   . Hyperlipidemia   . Hypothyroidism   . Insomnia   . Left ureteral stone   . Orthostatic hypotension    negative tilt table  . OSA (obstructive sleep apnea)    moderate with AHI 17/hr, uses CPAP nightly(01/15/19 - has Inspire implant)  . PVC's (premature ventricular contractions) 07/07/2016  . Renal disorder    PT IS SEEING DR. Lorrene Reid- NEPHROLOGIST  . Rosacea   . Urticaria     Past Surgical History:  Procedure Laterality Date  . CARDIAC CATHETERIZATION  09/06/2013   [place]  . CARDIAC CATHETERIZATION    . CORONARY ANGIOPLASTY  2004   STENT PLACEMENT  . CYSTOSCOPY WITH RETROGRADE PYELOGRAM, URETEROSCOPY AND STENT PLACEMENT Left 03/19/2014   Procedure: CYSTOSCOPY WITH RETROGRADE PYELOGRAM, URETEROSCOPY AND STENT PLACEMENT;  Surgeon: Junious Dresser, MD;  Location: [facility];  Service: Urology;  Laterality: Left;  . CYSTOSCOPY WITH RETROGRADE PYELOGRAM, URETEROSCOPY AND STENT PLACEMENT Bilateral 05/20/2015   Procedure:  CYSTOSCOPY WITH BILATERAL RETROGRADE PYELOGRAM,RIGHT  DIAGNOSTIC URETEROSCOPY ,LEFT URETEROSCOPY WITH HOLMIUM LASER  AND BILATERAL  STENT PLACEMENT ;  Surgeon: Alexis Frock, MD;  Location: [facility];  Service: Urology;  Laterality: Bilateral;  . DRUG INDUCED ENDOSCOPY N/A 10/20/2017   Procedure: DRUG INDUCED SLEEP ENDOSCOPY;  Surgeon: Melida Quitter, MD;  Location: [place];  Service: ENT;  Laterality: N/A;  . EP IMPLANTABLE DEVICE N/A 05/13/2016   Procedure: Loop Recorder Insertion;  Surgeon: Deboraha Sprang, MD;  Location: [place] [facility];  Service: Cardiovascular;  Laterality: N/A;  . ESOPHAGOGASTRODUODENOSCOPY (EGD) WITH PROPOFOL N/A 11/30/2018   Procedure: ESOPHAGOGASTRODUODENOSCOPY (EGD) WITH PROPOFOL;  Surgeon: Jonathon Bellows, MD;  Location: [facility] ENDOSCOPY;  Service: Gastroenterology;  Laterality: N/A;  . ESOPHAGOGASTRODUODENOSCOPY (EGD) WITH PROPOFOL N/A 12/01/2018   Procedure: ESOPHAGOGASTRODUODENOSCOPY (EGD) WITH PROPOFOL;  Surgeon: Lucilla Lame, MD;  Location: [facility];  Service: Endoscopy;  Laterality: N/A;  . HOLMIUM LASER APPLICATION Bilateral 05/16/537   Procedure: HOLMIUM LASER APPLICATION;  Surgeon: Alexis Frock, MD;  Location: [facility];  Service: Urology;  Laterality: Bilateral;  . IMPLIMENTATION OF A HYPOGLOSSAL NERVE STIMULATOR  12/08/2017   [facility]   . LEFT HEART CATHETERIZATION WITH CORONARY ANGIOGRAM N/A 09/06/2013   Procedure: LEFT HEART CATHETERIZATION WITH CORONARY ANGIOGRAM;  Surgeon: Sinclair Grooms, MD;  Location: [facility] CATH LAB;  Service: Cardiovascular;  Laterality: N/A;  . LEFT KNEE CAP  SURGERY ABOUT 40 YRS AGO  ? 1970    . STONE EXTRACTION WITH BASKET Left 03/19/2014   Procedure: STONE EXTRACTION WITH BASKET;  Surgeon: Junious Dresser, MD;  Location: [facility];  Service: Urology;  Laterality: Left;    Prior to Admission medications   Medication Sig Start Date End Date Taking? Authorizing Provider  alendronate (FOSAMAX) 70 MG tablet TAKE 1 TABLET BY  MOUTH EVERY 7 DAYS WITH A FULL GLASS OF WATER ON AN EMPTY STOMACH. 01/21/19  Yes Bedsole, Amy E, MD  ASPIRIN 81 PO Take by mouth daily.   Yes [provider]  atorvastatin (LIPITOR) 80 MG tablet Take 1 tablet (80 mg total) by mouth daily. 02/22/18  Yes Bedsole, Amy E, MD  clopidogrel (PLAVIX) 75 MG tablet TAKE 1 TABLET DAILY 11/07/18  Yes Turner, Traci R, MD  ferrous sulfate  325 (65 FE) MG tablet TAKE 1 TABLET BY MOUTH 3 TIMES DAILY WITH MEALS. 02/20/18  Yes Bedsole, Amy E, MD  FLUoxetine (PROZAC) 20 MG capsule TAKE 1 CAPSULE DAILY. TAKE WITH 40MG  TO EQUAL 60MG     ONCE DAILY 01/21/19  Yes Bedsole, Amy E, MD  FLUoxetine (PROZAC) 40 MG capsule TAKE 1 CAPSULE EVERY       MORNING 01/03/19  Yes Bedsole, Amy E, MD  furosemide (LASIX) 20 MG tablet TAKE 1 TABLET DAILY 04/10/18  Yes Imogene Burn, PA-C  hydrOXYzine (ATARAX/VISTARIL) 25 MG tablet TAKE 1 TABLET BY MOUTH AT BEDTIME MAY MAKE DROWSY 02/14/18  Yes [provider]  levothyroxine (SYNTHROID, LEVOTHROID) 25 MCG tablet Take 1 tablet (25 mcg total) by mouth daily before breakfast. 03/14/18  Yes Bedsole, Amy E, MD  midodrine (PROAMATINE) 10 MG tablet TAKE 1 TABLET (10 MG TOTAL) BY MOUTH three TIMES DAILY WITH BREAKFAST  LUNCH dinner 01/14/19  Yes Gollan, Kathlene November, MD  mirtazapine (REMERON) 15 MG tablet TAKE 1 TABLET BY MOUTH EVERYDAY AT BEDTIME 10/15/18  Yes Bedsole, Amy E, MD  nitroGLYCERIN (NITROSTAT) 0.4 MG SL tablet Place 1 tablet (0.4 mg total) under the tongue every 5 (five) minutes as needed for chest pain. 03/30/18  Yes Elby Beck, FNP  pantoprazole (PROTONIX) 40 MG tablet Take 1 tablet (40 mg total) by mouth 2 (two) times daily. 12/01/18 01/30/19 Yes Sainani, Belia Heman, MD  vitamin B-12 (CYANOCOBALAMIN) 1000 MCG tablet Take 1,000 mcg by mouth daily.    Yes [provider]  zolpidem (AMBIEN) 5 MG tablet TAKE 1 TABLET BY MOUTH AT BEDTIME AS NEEDED FOR SLEEP 09/09/18  Yes [provider]  cyclobenzaprine (FLEXERIL) 10 MG tablet TAKE 1/2-1 TAB BY MOUTH AT BEDTIME AS NEEDED FOR MUSCLE SPASMS Patient not taking: TAKE 1/2-1 TAB BY MOUTH AT BEDTIME AS NEEDED FOR MUSCLE SPASMS 01/14/19   Jinny Sanders, MD    Allergies as of 01/03/2019 - Review Complete 12/26/2018  Allergen Reaction Noted  . Codeine Nausea Only 06/05/2007  . Tape Other (See Comments) 03/19/2014    Family History  Problem  Relation Age of Onset  . Coronary artery disease Mother   . Heart attack Mother   . Heart disease Mother   . Hypertension Mother   . Cancer Father        lung and colon  . Lung cancer Father        smoker  . Diabetes Brother     Social History   Socioeconomic History  . Marital status: Married    Spouse name: Not on file  . Number of children: Not on file  . Years of education: Not on file  . Highest education level: Not on file  Occupational History  . Occupation: retired Designer, jewellery  . Financial resource strain: Not on file  . Food insecurity    Worry: Not on file    Inability: Not on file  . Transportation needs    Medical: Not on file  Non-medical: Not on file  Tobacco Use  . Smoking status: Never Smoker  . Smokeless tobacco: Never Used  Substance and Sexual Activity  . Alcohol use: Yes    Alcohol/week: 1.0 standard drinks    Types: 1 Cans of beer per week    Comment: Occasional beer  . Drug use: No  . Sexual activity: Yes    Partners: Female    Birth control/protection: None  Lifestyle  . Physical activity    Days per week: Not on file    Minutes per session: Not on file  . Stress: Not on file  Relationships  . Social Herbalist on phone: Not on file    Gets together: Not on file    Attends religious service: Not on file    Active member of club or organization: Not on file    Attends meetings of clubs or organizations: Not on file    Relationship status: Not on file  . Intimate partner violence    Fear of current or ex partner: Not on file    Emotionally abused: Not on file    Physically abused: Not on file    Forced sexual activity: Not on file  Other Topics Concern  . Not on file  Social History Narrative   Regular exercise--yes--jog 5 miles 6 days a week    Review of Systems: See HPI, otherwise negative ROS  Physical Exam: BP 135/67   Pulse 64   Temp 98.1 F (36.7 C)   Ht 6' (1.829 m)   Wt 77.6 kg   SpO2  99%   BMI 23.19 kg/m  General:   Alert,  pleasant and cooperative in NAD Head:  Normocephalic and atraumatic. Neck:  Supple; no masses or thyromegaly. Lungs:  Clear throughout to auscultation.    Heart:  Regular rate and rhythm. Abdomen:  Soft, nontender and nondistended. Normal bowel sounds, without guarding, and without rebound.   Neurologic:  Alert and  oriented x4;  grossly normal neurologically.  Impression/Plan: [name] is here for an colonoscopy to be performed for adenomatous polyps at last colonoscopy 5 years ago at outside center.  Risks, benefits, limitations, and alternatives regarding  colonoscopy have been reviewed with the patient.  Questions have been answered.  All parties agreeable.   Lucilla Lame, MD  01/21/2019, 11:22 AM

## 2019-01-21 NOTE — Op Note (Addendum)
[facility] Gastroenterology Patient Name: Shawn Lam Procedure Date: 01/21/2019 11:50 AM MRN: [id number] Account #: [id number] Date of Birth: 1950/05/19 Admit Type: Outpatient Age: 68 Room: [facility] OR ROOM 01 Gender: Male Note Status: Finalized Procedure:            Colonoscopy Indications:          High risk colon cancer surveillance: Personal history                        of colonic polyps Providers:            Lucilla Lame MD, MD Referring MD:         Jinny Sanders MD, MD (Referring MD) Medicines:            Propofol per Anesthesia Complications:        No immediate complications. Procedure:            Pre-Anesthesia Assessment:                       - Prior to the procedure, a History and Physical was                        performed, and patient medications and allergies were                        reviewed. The patient's tolerance of previous                        anesthesia was also reviewed. The risks and benefits of                        the procedure and the sedation options and risks were                        discussed with the patient. All questions were                        answered, and informed consent was obtained. Prior                        Anticoagulants: The patient has taken no previous                        anticoagulant or antiplatelet agents. ASA Grade                        Assessment: II - A patient with mild systemic disease.                        After reviewing the risks and benefits, the patient was                        deemed in satisfactory condition to undergo the                        procedure.                       After obtaining informed consent, the colonoscope was  passed under direct vision. Throughout the procedure,                        the patient's blood pressure, pulse, and oxygen                        saturations were monitored continuously. The was   introduced through the anus and advanced to the the                        cecum, identified by appendiceal orifice and ileocecal                        valve. The colonoscopy was performed without                        difficulty. The patient tolerated the procedure well.                        The quality of the bowel preparation was excellent. Findings:      The perianal and digital rectal examinations were normal.      Multiple small-mouthed diverticula were found in the sigmoid colon.      Non-bleeding internal hemorrhoids were found during retroflexion. The       hemorrhoids were Grade I (internal hemorrhoids that do not prolapse). Impression:           - Diverticulosis in the sigmoid colon.                       - Non-bleeding internal hemorrhoids.                       - No specimens collected. Recommendation:       - Discharge patient to home.                       - Resume previous diet.                       - Continue present medications.                       - Repeat colonoscopy in 5 years for surveillance. Procedure Code(s):    --- Professional ---                       [phone], Colonoscopy, flexible; diagnostic, including                        collection of specimen(s) by brushing or washing, when                        performed (separate procedure) Diagnosis Code(s):    --- Professional ---                       Z86.010, Personal history of colonic polyps CPT copyright 2019 American Medical Association. All rights reserved. The codes documented in this report are preliminary and upon coder review may  be revised to meet current compliance requirements. Lucilla Lame MD, MD 01/21/2019 12:12:12 PM This report has been signed electronically. Number of Addenda: 0 Note Initiated On: 01/21/2019 11:50 AM Scope Withdrawal  Time: 0 hours 8 minutes 54 seconds  Total Procedure Duration: 0 hours 12 minutes 39 seconds  Estimated Blood Loss: Estimated blood loss: none.       [facility]

## 2019-01-22 ENCOUNTER — Encounter: Payer: Self-pay | Admitting: Gastroenterology

## 2019-01-22 NOTE — Telephone Encounter (Signed)
Please advise if ok to refill Plavix 75 mg tablet qd. Last filled by Dr. Radford Pax.

## 2019-01-22 NOTE — Telephone Encounter (Signed)
This is Dr. Donivan Scull pt now. Thanks

## 2019-01-22 NOTE — Telephone Encounter (Signed)
Will forward to Dr. Rockey Situ to review refill as I don't know when he had his cath done and what the recommendation was for antiplatelet therapy long term.

## 2019-01-23 NOTE — Telephone Encounter (Signed)
Okay to refill Plavix He is not on aspirin.

## 2019-01-23 NOTE — Telephone Encounter (Signed)
RX for plavix was sent to the pharmacy.

## 2019-01-25 DIAGNOSIS — M25522 Pain in left elbow: Secondary | ICD-10-CM | POA: Diagnosis not present

## 2019-01-28 ENCOUNTER — Ambulatory Visit (INDEPENDENT_AMBULATORY_CARE_PROVIDER_SITE_OTHER): Payer: Medicare Other | Admitting: *Deleted

## 2019-01-28 DIAGNOSIS — I493 Ventricular premature depolarization: Secondary | ICD-10-CM

## 2019-01-28 DIAGNOSIS — R002 Palpitations: Secondary | ICD-10-CM | POA: Diagnosis not present

## 2019-01-28 LAB — CUP PACEART REMOTE DEVICE CHECK
Date Time Interrogation Session: 20201017173929
Implantable Pulse Generator Implant Date: 20180202

## 2019-01-30 ENCOUNTER — Other Ambulatory Visit: Payer: Self-pay

## 2019-01-30 ENCOUNTER — Telehealth: Payer: Self-pay | Admitting: Gastroenterology

## 2019-01-30 MED ORDER — PANTOPRAZOLE SODIUM 40 MG PO TBEC: 40.0000 mg | DELAYED_RELEASE_TABLET | Freq: Two times a day (BID) | ORAL | 5 refills | Status: DC

## 2019-01-30 NOTE — Telephone Encounter (Signed)
[facility] [place] on [address] (pantoprazole 40 mg twice a day ) This was prescribed  While patient was in the hospital with bleeding ulcer. He spoke to Dr Allen Norris about this when he had his colonoscopy & he was going to call into the pharmacy.

## 2019-01-30 NOTE — Telephone Encounter (Signed)
Pt notified rx was sent to his pharmacy.

## 2019-01-30 NOTE — Telephone Encounter (Signed)
Rx for Pantoprazole has been sent to pt's pharmacy.

## 2019-02-04 ENCOUNTER — Other Ambulatory Visit: Payer: Self-pay | Admitting: Family Medicine

## 2019-02-06 DIAGNOSIS — R3915 Urgency of urination: Secondary | ICD-10-CM | POA: Diagnosis not present

## 2019-02-06 DIAGNOSIS — R3916 Straining to void: Secondary | ICD-10-CM | POA: Diagnosis not present

## 2019-02-14 DIAGNOSIS — G4700 Insomnia, unspecified: Secondary | ICD-10-CM | POA: Diagnosis not present

## 2019-02-14 DIAGNOSIS — G4733 Obstructive sleep apnea (adult) (pediatric): Secondary | ICD-10-CM | POA: Diagnosis not present

## 2019-02-18 NOTE — Progress Notes (Signed)
[facility] Summary Report / Loop Recorder 

## 2019-02-19 ENCOUNTER — Other Ambulatory Visit: Payer: Self-pay | Admitting: Family Medicine

## 2019-02-20 DIAGNOSIS — R102 Pelvic and perineal pain: Secondary | ICD-10-CM | POA: Diagnosis not present

## 2019-02-20 DIAGNOSIS — R3916 Straining to void: Secondary | ICD-10-CM | POA: Diagnosis not present

## 2019-02-21 ENCOUNTER — Telehealth: Payer: Self-pay | Admitting: Family Medicine

## 2019-02-21 ENCOUNTER — Other Ambulatory Visit (INDEPENDENT_AMBULATORY_CARE_PROVIDER_SITE_OTHER): Payer: Medicare Other

## 2019-02-21 ENCOUNTER — Ambulatory Visit: Payer: Medicare Other

## 2019-02-21 ENCOUNTER — Other Ambulatory Visit: Payer: Self-pay

## 2019-02-21 ENCOUNTER — Ambulatory Visit (INDEPENDENT_AMBULATORY_CARE_PROVIDER_SITE_OTHER): Payer: Medicare Other

## 2019-02-21 DIAGNOSIS — E7800 Pure hypercholesterolemia, unspecified: Secondary | ICD-10-CM | POA: Diagnosis not present

## 2019-02-21 DIAGNOSIS — Z125 Encounter for screening for malignant neoplasm of prostate: Secondary | ICD-10-CM

## 2019-02-21 DIAGNOSIS — M8080XS Other osteoporosis with current pathological fracture, unspecified site, sequela: Secondary | ICD-10-CM

## 2019-02-21 DIAGNOSIS — D509 Iron deficiency anemia, unspecified: Secondary | ICD-10-CM

## 2019-02-21 DIAGNOSIS — E039 Hypothyroidism, unspecified: Secondary | ICD-10-CM | POA: Diagnosis not present

## 2019-02-21 DIAGNOSIS — Z0000 Encounter for general adult medical examination without abnormal findings: Secondary | ICD-10-CM

## 2019-02-21 LAB — COMPREHENSIVE METABOLIC PANEL
ALT: 20 U/L (ref 0–53)
AST: 33 U/L (ref 0–37)
Albumin: 4.2 g/dL (ref 3.5–5.2)
Alkaline Phosphatase: 89 U/L (ref 39–117)
BUN: 16 mg/dL (ref 6–23)
CO2: 27 mEq/L (ref 19–32)
Calcium: 9.1 mg/dL (ref 8.4–10.5)
Chloride: 106 mEq/L (ref 96–112)
Creatinine, Ser: 1.35 mg/dL (ref 0.40–1.50)
GFR: 52.45 mL/min — ABNORMAL LOW (ref >60.00)
Glucose, Bld: 96 mg/dL (ref 70–99)
Potassium: 4.6 mEq/L (ref 3.5–5.1)
Sodium: 141 mEq/L (ref 135–145)
Total Bilirubin: 0.3 mg/dL (ref 0.2–1.2)
Total Protein: 6.2 g/dL (ref 6.0–8.3)

## 2019-02-21 LAB — T4, FREE: Free T4: 0.63 ng/dL (ref 0.60–1.60)

## 2019-02-21 LAB — T3, FREE: T3, Free: 2.8 pg/mL (ref 2.3–4.2)

## 2019-02-21 LAB — TSH: TSH: 9.81 u[IU]/mL — ABNORMAL HIGH (ref 0.35–4.50)

## 2019-02-21 LAB — LIPID PANEL
Cholesterol: 147 mg/dL (ref 0–200)
HDL: 68.8 mg/dL (ref >39.00)
LDL Cholesterol: 66 mg/dL (ref 0–99)
NonHDL: 78.6
Total CHOL/HDL Ratio: 2
Triglycerides: 62 mg/dL (ref 0.0–149.0)
VLDL: 12.4 mg/dL (ref 0.0–40.0)

## 2019-02-21 LAB — PSA, MEDICARE: PSA: 1.07 ng/ml (ref 0.10–4.00)

## 2019-02-21 NOTE — Progress Notes (Signed)
PCP notes:  Health Maintenance: Discussed shingrix   Abnormal Screenings: none   Patient concerns: Patient complains of pain in his left leg. Onset 2-3 weeks ago.    Nurse concerns: none   Next PCP appt.: 02/26/2019 @ 8:40 am

## 2019-02-21 NOTE — Progress Notes (Signed)
Subjective:   CHAVEZ ROSOL is a 68 y.o. male who presents for Medicare Annual/Subsequent preventive examination.  Review of Systems: N/A   This visit is being conducted through telemedicine via telephone at the nurse health advisor's home address due to the COVID-19 pandemic. This patient has given me verbal consent via doximity to conduct this visit, patient states they are participating from their home address. Patient and myself are on the telephone call. There is no referral for this visit. Some vital signs may be absent or patient reported.    Patient identification: identified by name, DOB, and current address   Cardiac Risk Factors include: advanced age (>43men, >54 women);male gender;dyslipidemia     Objective:    Vitals: There were no vitals taken for this visit.  There is no height or weight on file to calculate BMI.  Advanced Directives 02/21/2019 01/21/2019 12/01/2018 11/30/2018 11/28/2018 11/28/2018 02/13/2018  Does Patient Have a Medical Advance Directive? No No No No No No No  Type of Advance Directive - - - - - - -  Does patient want to make changes to medical advance directive? - - - - - - -  Copy of [place] in Chart? - - - - - - -  Would patient like information on creating a medical advance directive? No - Patient declined No - Patient declined No - Patient declined - No - Patient declined - No - Patient declined  Some encounter information is confidential and restricted. Go to Review Flowsheets activity to see all data.    Tobacco Social History   Tobacco Use  Smoking Status Never Smoker  Smokeless Tobacco Never Used     Counseling given: Not Answered   Clinical Intake:  Pre-visit preparation completed: Yes  Pain : No/denies pain     Nutritional Risks: None Diabetes: No  How often do you need to have someone help you when you read instructions, pamphlets, or other written materials from your doctor or pharmacy?: 1 - Never What  is the last grade level you completed in school?: 12th  Interpreter Needed?: No  Information entered by :: CJohnson, LPN  Past Medical History:  Diagnosis Date   Anemia    Anxiety    Asymptomatic LV dysfunction    50-55% by echo 06/2016   Berger's disease    CAD (coronary artery disease) 06/2005   PCI of LAD and RCA   Depression    Dilated aortic root (HCC)    4cm at sinus of Valsalva   Hematuria    Hyperlipidemia    Hypothyroidism    Insomnia    Left ureteral stone    Orthostatic hypotension    negative tilt table   OSA (obstructive sleep apnea)    moderate with AHI 17/hr, uses CPAP nightly(01/15/19 - has Inspire implant)   PVC's (premature ventricular contractions) 07/07/2016   Renal disorder    PT IS SEEING DR. Lorrene Reid- NEPHROLOGIST   Rosacea    Urticaria    Past Surgical History:  Procedure Laterality Date   CARDIAC CATHETERIZATION  09/06/2013   [facility]   CARDIAC CATHETERIZATION     COLONOSCOPY WITH PROPOFOL N/A 01/21/2019   Procedure: COLONOSCOPY WITH PROPOFOL;  Surgeon: Lucilla Lame, MD;  Location: [place];  Service: Endoscopy;  Laterality: N/A;  sleep apnea   CORONARY ANGIOPLASTY  2004   STENT PLACEMENT   CYSTOSCOPY WITH RETROGRADE PYELOGRAM, URETEROSCOPY AND STENT PLACEMENT Left 03/19/2014   Procedure: CYSTOSCOPY WITH RETROGRADE PYELOGRAM, URETEROSCOPY AND  STENT PLACEMENT;  Surgeon: Junious Dresser, MD;  Location: [facility];  Service: Urology;  Laterality: Left;   CYSTOSCOPY WITH RETROGRADE PYELOGRAM, URETEROSCOPY AND STENT PLACEMENT Bilateral 05/20/2015   Procedure:  CYSTOSCOPY WITH BILATERAL RETROGRADE PYELOGRAM,RIGHT  DIAGNOSTIC URETEROSCOPY ,LEFT URETEROSCOPY WITH HOLMIUM LASER  AND BILATERAL  STENT PLACEMENT ;  Surgeon: Alexis Frock, MD;  Location: [facility];  Service: Urology;  Laterality: Bilateral;   DRUG INDUCED ENDOSCOPY N/A 10/20/2017   Procedure: DRUG INDUCED SLEEP ENDOSCOPY;  Surgeon: Melida Quitter, MD;  Location: [place];  Service: ENT;  Laterality: N/A;   EP IMPLANTABLE DEVICE N/A 05/13/2016   Procedure: Loop Recorder Insertion;  Surgeon: Deboraha Sprang, MD;  Location: [place] [facility];  Service: Cardiovascular;  Laterality: N/A;   ESOPHAGOGASTRODUODENOSCOPY (EGD) WITH PROPOFOL N/A 11/30/2018   Procedure: ESOPHAGOGASTRODUODENOSCOPY (EGD) WITH PROPOFOL;  Surgeon: Jonathon Bellows, MD;  Location[facility];  Service: Gastroenterology;  Laterality: N/A;   ESOPHAGOGASTRODUODENOSCOPY (EGD) WITH PROPOFOL N/A 12/01/2018   Procedure: ESOPHAGOGASTRODUODENOSCOPY (EGD) WITH PROPOFOL;  Surgeon: Lucilla Lame, MD;  Location: [facility];  Service: Endoscopy;  Laterality: N/A;   HOLMIUM LASER APPLICATION Bilateral 12/11/6382   Procedure: HOLMIUM LASER APPLICATION;  Surgeon: Alexis Frock, MD;  Location: [facility];  Service: Urology;  Laterality: Bilateral;   IMPLIMENTATION OF A HYPOGLOSSAL NERVE STIMULATOR  12/08/2017   OSA    LEFT HEART CATHETERIZATION WITH CORONARY ANGIOGRAM N/A 09/06/2013   Procedure: LEFT HEART CATHETERIZATION WITH CORONARY ANGIOGRAM;  Surgeon: Sinclair Grooms, MD;  Location: [facility] C[facility];  Service: Cardiovascular;  Laterality: N/A;   LEFT KNEE CAP  SURGERY ABOUT 40 YRS AGO  ? 1970     STONE EXTRACTION WITH BASKET Left 03/19/2014   Procedure: STONE EXTRACTION WITH BASKET;  Surgeon: Junious Dresser, MD;  Location: [facility];  Service: Urology;  Laterality: Left;   Family History  Problem Relation Age of Onset   Coronary artery disease Mother    Heart attack Mother    Heart disease Mother    Hypertension Mother    Cancer Father        lung and colon   Lung cancer Father        smoker   Diabetes Brother    Social History   Socioeconomic History   Marital status: Married    Spouse name: Not on file   Number of children: Not on file   Years of education: Not on file   Highest education level: Not on file  Occupational History   Occupation: retired Therapist, art strain: Not hard at all   Food insecurity    Worry: Never true    Inability: Never true   Transportation needs    Medical: No    Non-medical: No  Tobacco Use   Smoking status: Never Smoker   Smokeless tobacco: Never Used  Substance and Sexual Activity   Alcohol use: Yes    Alcohol/week: 1.0 standard drinks    Types: 1 Cans of beer per week    Comment: Occasional beer   Drug use: No   Sexual activity: Yes    Partners: Female    Birth control/protection: None  Lifestyle   Physical activity    Days per week: 0 days    Minutes per session: 0 min   Stress: Not at all  Relationships   Social connections    Talks on phone: Not on file    Gets  together: Not on file    Attends religious service: Not on file    Active member of club or organization: Not on file    Attends meetings of clubs or organizations: Not on file    Relationship status: Not on file  Other Topics Concern   Not on file  Social History Narrative   Regular exercise--yes--jog 5 miles 6 days a week    Outpatient Encounter Medications as of 02/21/2019  Medication Sig   alendronate (FOSAMAX) 70 MG tablet TAKE 1 TABLET BY MOUTH EVERY 7 DAYS WITH A FULL GLASS OF WATER ON AN EMPTY STOMACH.   ASPIRIN 81 PO Take by mouth daily.   atorvastatin (LIPITOR) 80 MG tablet TAKE 1 TABLET DAILY   clopidogrel (PLAVIX) 75 MG tablet TAKE 1 TABLET DAILY   cyclobenzaprine (FLEXERIL) 10 MG tablet TAKE 1/2-1 TAB BY MOUTH AT BEDTIME AS NEEDED FOR MUSCLE SPASMS   ferrous sulfate 325 (65 FE) MG tablet TAKE 1 TABLET BY MOUTH 3 TIMES DAILY WITH MEALS.   FLUoxetine (PROZAC) 20 MG capsule TAKE 1 CAPSULE DAILY. TAKE WITH 40MG  TO EQUAL 60MG     ONCE DAILY   FLUoxetine (PROZAC) 40 MG capsule TAKE 1 CAPSULE EVERY       MORNING   furosemide (LASIX) 20 MG tablet TAKE 1 TABLET DAILY   hydrOXYzine (ATARAX/VISTARIL) 25 MG tablet TAKE 1 TABLET BY MOUTH AT BEDTIME MAY MAKE DROWSY    levothyroxine (SYNTHROID, LEVOTHROID) 25 MCG tablet Take 1 tablet (25 mcg total) by mouth daily before breakfast.   midodrine (PROAMATINE) 10 MG tablet TAKE 1 TABLET (10 MG TOTAL) BY MOUTH three TIMES DAILY WITH BREAKFAST  LUNCH dinner   mirtazapine (REMERON) 15 MG tablet TAKE 1 TABLET BY MOUTH EVERYDAY AT BEDTIME   nitroGLYCERIN (NITROSTAT) 0.4 MG SL tablet Place 1 tablet (0.4 mg total) under the tongue every 5 (five) minutes as needed for chest pain.   pantoprazole (PROTONIX) 40 MG tablet Take 1 tablet (40 mg total) by mouth 2 (two) times daily.   vitamin B-12 (CYANOCOBALAMIN) 1000 MCG tablet Take 1,000 mcg by mouth daily.    zolpidem (AMBIEN) 5 MG tablet TAKE 1 TABLET BY MOUTH AT BEDTIME AS NEEDED FOR SLEEP   No facility-administered encounter medications on file as of 02/21/2019.     Activities of Daily Living In your present state of health, do you have any difficulty performing the following activities: 02/21/2019 01/21/2019  Hearing? N N  Vision? N N  Difficulty concentrating or making decisions? N N  Walking or climbing stairs? N N  Dressing or bathing? N N  Doing errands, shopping? N -  Preparing Food and eating ? N -  Using the Toilet? N -  In the past six months, have you accidently leaked urine? N -  Do you have problems with loss of bowel control? N -  Managing your Medications? N -  Managing your Finances? N -  Housekeeping or managing your Housekeeping? N -  Some recent data might be hidden    Patient Care Team: Jinny Sanders, MD as PCP - General Sueanne Margarita, MD as PCP - Cardiology (Cardiology)   Assessment:   This is a routine wellness examination for Gwyndolyn Saxon.  Exercise Activities and Dietary recommendations Current Exercise Habits: Home exercise routine, Type of exercise: walking, Time (Minutes): 60, Frequency (Times/Week): 7, Weekly Exercise (Minutes/Week): 420, Intensity: Mild, Exercise limited by: None identified  Goals     DIET - EAT MORE FRUITS  AND VEGETABLES  Starting 02/13/2018, I will attempt to eat at least 2 servings of fresh fruits and vegetables daily.      Patient Stated     02/21/2019, I will maintain and continue medications as prescribed.       Fall Risk Fall Risk  02/21/2019 02/27/2018 02/13/2018 02/10/2017 02/09/2016  Falls in the past year? 1 1 0 No No  Comment passed out due to low hemoglobin Emmi Telephone Survey: data to providers prior to load - - -  Number falls in past yr: 0 1 - - -  Comment - Emmi Telephone Survey Actual Response = 40 - - -  Injury with Fall? 1 0 - - -  Comment Patient fractured his arm - - - -  Risk for fall due to : Other (Comment) - - - -  Risk for fall due to: Comment low hemoglobin - - - -  Follow up Falls evaluation completed;Falls prevention discussed - - - -   Is the patient's home free of loose throw rugs in walkways, pet beds, electrical cords, etc?   yes      Grab bars in the bathroom? no      Handrails on the stairs?   yes      Adequate lighting?   yes  Timed Get Up and Go Performed: N/A  Depression Screen PHQ 2/9 Scores 02/21/2019 02/13/2018 02/10/2017 02/09/2016  PHQ - 2 Score 0 0 0 6  PHQ- 9 Score 0 0 - 19  Some encounter information is confidential and restricted. Go to Review Flowsheets activity to see all data.    Cognitive Function MMSE - Mini Mental State Exam 02/21/2019 02/13/2018  Orientation to time 5 5  Orientation to Place 5 5  Registration 3 3  Attention/ Calculation 5 0  Recall 3 2  Recall-comments - unable to recall 1 of 3 words  Language- name 2 objects - 0  Language- repeat 1 1  Language- follow 3 step command - 3  Language- read & follow direction - 0  Write a sentence - 0  Copy design - 0  Total score - 19  Mini Cog  Mini-Cog screen was completed. Maximum score is 22. A value of 0 denotes this part of the MMSE was not completed or the patient failed this part of the Mini-Cog screening.       Immunization History  Administered  Date(s) Administered   Fluad Quad(high Dose 65+) 12/04/2018   Influenza Split 01/04/2011, 01/27/2012   Influenza Whole 01/09/2008, 03/04/2009, 12/27/2009   Influenza,inj,Quad PF,6+ Mos 01/31/2013, 01/31/2014, 02/03/2015, 02/09/2016, 01/20/2017, 01/23/2018   Pneumococcal Conjugate-13 02/09/2016   Pneumococcal Polysaccharide-23 02/23/2017   Td 06/05/2007   Zoster 01/25/2011    Qualifies for Shingles Vaccine? Yes  Screening Tests Health Maintenance  Topic Date Due   TETANUS/TDAP  04/11/2019 (Originally 06/04/2017)   COLONOSCOPY  01/21/2024   INFLUENZA VACCINE  Completed   Hepatitis C Screening  Completed   PNA vac Low Risk Adult  Completed   Cancer Screenings: Lung: Low Dose CT Chest recommended if Age 9-80 years, 30 pack-year currently smoking OR have quit w/in 15years. Patient does not qualify. Colorectal: completed 01/21/2019  Additional Screenings:  Hepatitis C Screening: 02/03/2015      Plan:    Patient wants to maintain and continue medications as prescribed.  I have personally reviewed and noted the following in the patients chart:    Medical and social history  Use of alcohol, tobacco or illicit drugs   Current medications and  supplements  Functional ability and status  Nutritional status  Physical activity  Advanced directives  List of other physicians  Hospitalizations, surgeries, and ER visits in previous 12 months  Vitals  Screenings to include cognitive, depression, and falls  Referrals and appointments  In addition, I have reviewed and discussed with patient certain preventive protocols, quality metrics, and best practice recommendations. A written personalized care plan for preventive services as well as general preventive health recommendations were provided to patient.     Andrez Grime, LPN  [id number]

## 2019-02-21 NOTE — Progress Notes (Signed)
No critical labs need to be addressed urgently. We will discuss labs in detail at upcoming office visit.   

## 2019-02-21 NOTE — Telephone Encounter (Signed)
-----   Message from Ellamae Sia sent at 02/14/2019 12:07 PM EST ----- Regarding: Lab orderes for Thursday, 11.12.20 Patient is scheduled for CPX labs, please order future labs, Thanks , Karna Christmas

## 2019-02-21 NOTE — Patient Instructions (Signed)
[name] , Thank you for taking time to come for your Medicare Wellness Visit. I appreciate your ongoing commitment to your health goals. Please review the following plan we discussed and let me know if I can assist you in the future.   Screening recommendations/referrals: Colonoscopy: Up to date, completed 01/21/2019 Recommended yearly ophthalmology/optometry visit for glaucoma screening and checkup Recommended yearly dental visit for hygiene and checkup  Vaccinations: Influenza vaccine: Up to date, completed 12/04/2018 Pneumococcal vaccine: Completed series Tdap vaccine: decline Shingles vaccine: will check with insurance  Advanced directives: Advance directive discussed with you today. Even though you declined this today please call our office should you change your mind and we can give you the proper paperwork for you to fill out.  Conditions/risks identified: hyperlipidemia  Next appointment: 02/26/2019 @ 8:40 am   Preventive Care 65 Years and Older, Male Preventive care refers to lifestyle choices and visits with your health care provider that can promote health and wellness. What does preventive care include?  A yearly physical exam. This is also called an annual well check.  Dental exams once or twice a year.  Routine eye exams. Ask your health care provider how often you should have your eyes checked.  Personal lifestyle choices, including:  Daily care of your teeth and gums.  Regular physical activity.  Eating a healthy diet.  Avoiding tobacco and drug use.  Limiting alcohol use.  Practicing safe sex.  Taking low doses of aspirin every day.  Taking vitamin and mineral supplements as recommended by your health care provider. What happens during an annual well check? The services and screenings done by your health care provider during your annual well check will depend on your age, overall health, lifestyle risk factors, and family history of disease.  Counseling  Your health care provider may ask you questions about your:  Alcohol use.  Tobacco use.  Drug use.  Emotional well-being.  Home and relationship well-being.  Sexual activity.  Eating habits.  History of falls.  Memory and ability to understand (cognition).  Work and work Statistician. Screening  You may have the following tests or measurements:  Height, weight, and BMI.  Blood pressure.  Lipid and cholesterol levels. These may be checked every 5 years, or more frequently if you are over 40 years old.  Skin check.  Lung cancer screening. You may have this screening every year starting at age 13 if you have a 30-pack-year history of smoking and currently smoke or have quit within the past 15 years.  Fecal occult blood test (FOBT) of the stool. You may have this test every year starting at age 60.  Flexible sigmoidoscopy or colonoscopy. You may have a sigmoidoscopy every 5 years or a colonoscopy every 10 years starting at age 61.  Prostate cancer screening. Recommendations will vary depending on your family history and other risks.  Hepatitis C blood test.  Hepatitis B blood test.  Sexually transmitted disease (STD) testing.  Diabetes screening. This is done by checking your blood sugar (glucose) after you have not eaten for a while (fasting). You may have this done every 1-3 years.  Abdominal aortic aneurysm (AAA) screening. You may need this if you are a current or former smoker.  Osteoporosis. You may be screened starting at age 35 if you are at high risk. Talk with your health care provider about your test results, treatment options, and if necessary, the need for more tests. Vaccines  Your health care provider may recommend certain  vaccines, such as:  Influenza vaccine. This is recommended every year.  Tetanus, diphtheria, and acellular pertussis (Tdap, Td) vaccine. You may need a Td booster every 10 years.  Zoster vaccine. You may need this  after age 56.  Pneumococcal 13-valent conjugate (PCV13) vaccine. One dose is recommended after age 22.  Pneumococcal polysaccharide (PPSV23) vaccine. One dose is recommended after age 74. Talk to your health care provider about which screenings and vaccines you need and how often you need them. This information is not intended to replace advice given to you by your health care provider. Make sure you discuss any questions you have with your health care provider. Document Released: 04/24/2015 Document Revised: 12/16/2015 Document Reviewed: 01/27/2015 Elsevier Interactive Patient Education  2017 [place] Prevention in the Home Falls can cause injuries. They can happen to people of all ages. There are many things you can do to make your home safe and to help prevent falls. What can I do on the outside of my home?  Regularly fix the edges of walkways and driveways and fix any cracks.  Remove anything that might make you trip as you walk through a door, such as a raised step or threshold.  Trim any bushes or trees on the path to your home.  Use bright outdoor lighting.  Clear any walking paths of anything that might make someone trip, such as rocks or tools.  Regularly check to see if handrails are loose or broken. Make sure that both sides of any steps have handrails.  Any raised decks and porches should have guardrails on the edges.  Have any leaves, snow, or ice cleared regularly.  Use sand or salt on walking paths during winter.  Clean up any spills in your garage right away. This includes oil or grease spills. What can I do in the bathroom?  Use night lights.  Install grab bars by the toilet and in the tub and shower. Do not use towel bars as grab bars.  Use non-skid mats or decals in the tub or shower.  If you need to sit down in the shower, use a plastic, non-slip stool.  Keep the floor dry. Clean up any water that spills on the floor as soon as it happens.   Remove soap buildup in the tub or shower regularly.  Attach bath mats securely with double-sided non-slip rug tape.  Do not have throw rugs and other things on the floor that can make you trip. What can I do in the bedroom?  Use night lights.  Make sure that you have a light by your bed that is easy to reach.  Do not use any sheets or blankets that are too big for your bed. They should not hang down onto the floor.  Have a firm chair that has side arms. You can use this for support while you get dressed.  Do not have throw rugs and other things on the floor that can make you trip. What can I do in the kitchen?  Clean up any spills right away.  Avoid walking on wet floors.  Keep items that you use a lot in easy-to-reach places.  If you need to reach something above you, use a strong step stool that has a grab bar.  Keep electrical cords out of the way.  Do not use floor polish or wax that makes floors slippery. If you must use wax, use non-skid floor wax.  Do not have throw rugs and other things  on the floor that can make you trip. What can I do with my stairs?  Do not leave any items on the stairs.  Make sure that there are handrails on both sides of the stairs and use them. Fix handrails that are broken or loose. Make sure that handrails are as long as the stairways.  Check any carpeting to make sure that it is firmly attached to the stairs. Fix any carpet that is loose or worn.  Avoid having throw rugs at the top or bottom of the stairs. If you do have throw rugs, attach them to the floor with carpet tape.  Make sure that you have a light switch at the top of the stairs and the bottom of the stairs. If you do not have them, ask someone to add them for you. What else can I do to help prevent falls?  Wear shoes that:  Do not have high heels.  Have rubber bottoms.  Are comfortable and fit you well.  Are closed at the toe. Do not wear sandals.  If you use a  stepladder:  Make sure that it is fully opened. Do not climb a closed stepladder.  Make sure that both sides of the stepladder are locked into place.  Ask someone to hold it for you, if possible.  Clearly mark and make sure that you can see:  Any grab bars or handrails.  First and last steps.  Where the edge of each step is.  Use tools that help you move around (mobility aids) if they are needed. These include:  Canes.  Walkers.  Scooters.  Crutches.  Turn on the lights when you go into a dark area. Replace any light bulbs as soon as they burn out.  Set up your furniture so you have a clear path. Avoid moving your furniture around.  If any of your floors are uneven, fix them.  If there are any pets around you, be aware of where they are.  Review your medicines with your doctor. Some medicines can make you feel dizzy. This can increase your chance of falling. Ask your doctor what other things that you can do to help prevent falls. This information is not intended to replace advice given to you by your health care provider. Make sure you discuss any questions you have with your health care provider. Document Released: 01/22/2009 Document Revised: 09/03/2015 Document Reviewed: 05/02/2014 Elsevier Interactive Patient Education  2017 [employer].

## 2019-02-25 ENCOUNTER — Other Ambulatory Visit: Payer: Self-pay | Admitting: Cardiology

## 2019-02-26 ENCOUNTER — Ambulatory Visit (INDEPENDENT_AMBULATORY_CARE_PROVIDER_SITE_OTHER): Payer: Medicare Other | Admitting: Family Medicine

## 2019-02-26 ENCOUNTER — Encounter: Payer: Medicare Other | Admitting: Family Medicine

## 2019-02-26 ENCOUNTER — Other Ambulatory Visit: Payer: Self-pay

## 2019-02-26 ENCOUNTER — Telehealth: Payer: Self-pay

## 2019-02-26 ENCOUNTER — Encounter: Payer: Self-pay | Admitting: Family Medicine

## 2019-02-26 VITALS — BP 122/72 | HR 68 | Temp 98.0°F | Ht 69.5 in | Wt 177.8 lb

## 2019-02-26 DIAGNOSIS — E039 Hypothyroidism, unspecified: Secondary | ICD-10-CM | POA: Diagnosis not present

## 2019-02-26 DIAGNOSIS — I25118 Atherosclerotic heart disease of native coronary artery with other forms of angina pectoris: Secondary | ICD-10-CM

## 2019-02-26 DIAGNOSIS — R5383 Other fatigue: Secondary | ICD-10-CM | POA: Diagnosis not present

## 2019-02-26 DIAGNOSIS — F320 Major depressive disorder, single episode, mild: Secondary | ICD-10-CM

## 2019-02-26 DIAGNOSIS — N028 Recurrent and persistent hematuria with other morphologic changes: Secondary | ICD-10-CM

## 2019-02-26 DIAGNOSIS — D509 Iron deficiency anemia, unspecified: Secondary | ICD-10-CM | POA: Diagnosis not present

## 2019-02-26 DIAGNOSIS — M1712 Unilateral primary osteoarthritis, left knee: Secondary | ICD-10-CM | POA: Diagnosis not present

## 2019-02-26 DIAGNOSIS — F411 Generalized anxiety disorder: Secondary | ICD-10-CM | POA: Diagnosis not present

## 2019-02-26 DIAGNOSIS — G4733 Obstructive sleep apnea (adult) (pediatric): Secondary | ICD-10-CM | POA: Diagnosis not present

## 2019-02-26 DIAGNOSIS — N1831 Chronic kidney disease, stage 3a: Secondary | ICD-10-CM

## 2019-02-26 DIAGNOSIS — E7800 Pure hypercholesterolemia, unspecified: Secondary | ICD-10-CM | POA: Diagnosis not present

## 2019-02-26 DIAGNOSIS — F32A Depression, unspecified: Secondary | ICD-10-CM

## 2019-02-26 MED ORDER — HYDROCODONE-ACETAMINOPHEN 5-325 MG PO TABS: 1.0000 | ORAL_TABLET | Freq: Three times a day (TID) | ORAL | 0 refills | Status: DC | PRN

## 2019-02-26 MED ORDER — PANTOPRAZOLE SODIUM 40 MG PO TBEC: 40.0000 mg | DELAYED_RELEASE_TABLET | Freq: Two times a day (BID) | ORAL | 5 refills | Status: DC

## 2019-02-26 NOTE — Telephone Encounter (Signed)
Pt left v/m; pt was supposed to call back and let [name] CMA know that he does not have refills on pantoprazole.

## 2019-02-26 NOTE — Assessment & Plan Note (Addendum)
Stable on INSPIRE

## 2019-02-26 NOTE — Patient Instructions (Addendum)
Increase levothyroxine to 50 mcg daily. Recheck thyroid level in 4 weeks.   Please stop at the lab to have labs drawn.  Consider Td  and shingles at pharmacy.  Make sure not to use more than  4000 mg. Use vicodin for severe pain.  Return to [facility] for a better longterm plan.

## 2019-02-26 NOTE — Assessment & Plan Note (Signed)
Followed with renal.

## 2019-02-26 NOTE — Assessment & Plan Note (Signed)
Well controlled. Continue current medication.  

## 2019-02-26 NOTE — Assessment & Plan Note (Addendum)
increase dose of levothyroxine given fatigue.

## 2019-02-26 NOTE — Telephone Encounter (Signed)
Pantoprazole refills sent to [facility] on [address].  Shawn Lam notified by telephone that refills have been sent to his pharmacy.

## 2019-02-26 NOTE — Progress Notes (Signed)
Chief Complaint  Patient presents with  . Annual Exam    Part 2    History of Present Illness: HPI  The patient presents for review of chronic health problems. He/She also has the following acute concerns today: fatigue later in day. No blood in stool from bleeding ulcer HX of GI bleed on iron 3 tabs of 325 mg daily ( no SE).  The patient saw a LPN or RN for medicare wellness visit.  Prevention and wellness was reviewed in detail. Note reviewed and important notes copied below.  Health Maintenance: Discussed shingrix Abnormal Screenings: none Patient concerns: Patient complains of pain in his left leg. Onset 2-3 weeks ago.   02/26/19  Left knee pain:  Dx with OA at [facility] 01/2019. Given steroid injection.. helps some but then it comes back.  He is taking 6-9 tylenol a day for relief.   Intermittant swelling, lateral pain.  Pain increased when putting weight on it.  topical diclofenac did not help.   Thoracic back pain, chronic  X-ray  Questionable new compression fracture T11  Referred to Dr. Ernestina Patches 03/06/2018  Hx of compression fracture.   Was on Forteo since 2018.  Stopped given Forteo given cost.  Last bone density 05/2017 showed osteopenia. Now on alendronate.  GAD, mild depression. Stable on current meds. Sleeping well at night.   Clinical Support from 02/21/2019 in [place] at [facility] Total Score  0     OSA: followed by pulmo on  INSPIRE   Elevated Cholesterol:  Now at goal <70 on statin. No change in diet. Lab Results  Component Value Date   CHOL 147 02/21/2019   HDL 68.80 02/21/2019   LDLCALC 66 02/21/2019   LDLDIRECT 57 06/22/2009   TRIG 62.0 02/21/2019   CHOLHDL 2 02/21/2019       Using medications without problems:none Muscle aches: none Diet compliance: healthy Exercise:limited with back. Other complaints:  Prediabetes:  improved  CAD, PVCs: Followed by cardiology No chest pain., no SOB, no edema.  IgA  nephropathy: Followed by renal Dr. Lorrene Reid. Stable, slightly improved  Hypothyroid: low normal.. increase dose of levothyroxine given fatigue.  Patient Care Team: Jinny Sanders, MD as PCP - General Sueanne Margarita, MD as PCP - Cardiology (Cardiology)   COVID 19 screen No recent travel or known exposure to [place] The patient denies respiratory symptoms of COVID 19 at this time.  The importance of social distancing was discussed today.   Review of Systems  Constitutional: Negative for chills and fever.  HENT: Negative for congestion and ear pain.   Eyes: Negative for pain and redness.  Respiratory: Negative for cough and shortness of breath.   Cardiovascular: Negative for chest pain, palpitations and leg swelling.  Gastrointestinal: Negative for abdominal pain, blood in stool, constipation, diarrhea, nausea and vomiting.  Genitourinary: Negative for dysuria.  Musculoskeletal: Negative for falls and myalgias.  Skin: Negative for rash.  Neurological: Negative for dizziness.  Psychiatric/Behavioral: Negative for depression. The patient is not nervous/anxious.       Past Medical History:  Diagnosis Date  . Anemia   . Anxiety   . Asymptomatic LV dysfunction    50-55% by echo 06/2016  . Berger's disease   . CAD (coronary artery disease) 06/2005   PCI of LAD and RCA  . Depression   . Dilated aortic root (HCC)    4cm at sinus of Valsalva  . Hematuria   . Hyperlipidemia   . Hypothyroidism   .  Insomnia   . Left ureteral stone   . Orthostatic hypotension    negative tilt table  . OSA (obstructive sleep apnea)    moderate with AHI 17/hr, uses CPAP nightly(01/15/19 - has Inspire implant)  . PVC's (premature ventricular contractions) 07/07/2016  . Renal disorder    PT IS SEEING DR. Lorrene Reid- NEPHROLOGIST  . Rosacea   . Urticaria     reports that he has never smoked. He has never used smokeless tobacco. He reports current alcohol use of about 1.0 standard drinks of alcohol per week.  He reports that he does not use drugs.   Current Outpatient Medications:  .  alendronate (FOSAMAX) 70 MG tablet, TAKE 1 TABLET BY MOUTH EVERY 7 DAYS WITH A FULL GLASS OF WATER ON AN EMPTY STOMACH., Disp: 12 tablet, Rfl: 3 .  ASPIRIN 81 PO, Take by mouth daily., Disp: , Rfl:  .  atorvastatin (LIPITOR) 80 MG tablet, TAKE 1 TABLET DAILY, Disp: 90 tablet, Rfl: 0 .  clopidogrel (PLAVIX) 75 MG tablet, TAKE 1 TABLET DAILY, Disp: 90 tablet, Rfl: 3 .  cyclobenzaprine (FLEXERIL) 10 MG tablet, TAKE 1/2-1 TAB BY MOUTH AT BEDTIME AS NEEDED FOR MUSCLE SPASMS, Disp: 30 tablet, Rfl: 0 .  ferrous sulfate 325 (65 FE) MG tablet, TAKE 1 TABLET BY MOUTH 3 TIMES DAILY WITH MEALS., Disp: 270 tablet, Rfl: 3 .  FLUoxetine (PROZAC) 20 MG capsule, TAKE 1 CAPSULE DAILY. TAKE WITH 40MG  TO EQUAL 60MG     ONCE DAILY, Disp: 90 capsule, Rfl: 1 .  FLUoxetine (PROZAC) 40 MG capsule, TAKE 1 CAPSULE EVERY       MORNING, Disp: 90 capsule, Rfl: 1 .  furosemide (LASIX) 20 MG tablet, TAKE 1 TABLET DAILY, Disp: 90 tablet, Rfl: 3 .  hydrOXYzine (ATARAX/VISTARIL) 25 MG tablet, TAKE 1 TABLET BY MOUTH AT BEDTIME MAY MAKE DROWSY, Disp: , Rfl: 3 .  levothyroxine (SYNTHROID, LEVOTHROID) 25 MCG tablet, Take 1 tablet (25 mcg total) by mouth daily before breakfast., Disp: 90 tablet, Rfl: 3 .  midodrine (PROAMATINE) 10 MG tablet, TAKE 1 TABLET (10 MG TOTAL) BY MOUTH three TIMES DAILY WITH BREAKFAST  LUNCH dinner, Disp: 270 tablet, Rfl: 3 .  mirtazapine (REMERON) 15 MG tablet, TAKE 1 TABLET BY MOUTH EVERYDAY AT BEDTIME, Disp: 90 tablet, Rfl: 1 .  nitroGLYCERIN (NITROSTAT) 0.4 MG SL tablet, Place 1 tablet (0.4 mg total) under the tongue every 5 (five) minutes as needed for chest pain., Disp: 10 tablet, Rfl: 1 .  pantoprazole (PROTONIX) 40 MG tablet, Take 1 tablet (40 mg total) by mouth 2 (two) times daily., Disp: 60 tablet, Rfl: 5 .  vitamin B-12 (CYANOCOBALAMIN) 1000 MCG tablet, Take 1,000 mcg by mouth daily. , Disp: , Rfl:  .  zolpidem (AMBIEN) 5 MG  tablet, TAKE 1 TABLET BY MOUTH AT BEDTIME AS NEEDED FOR SLEEP, Disp: , Rfl:  .  tamsulosin (FLOMAX) 0.4 MG CAPS capsule, Take 0.4 mg by mouth at bedtime., Disp: , Rfl:    Observations/Objective: Blood pressure 122/72, pulse 68, temperature 98 F (36.7 C), temperature source Temporal, height 5' 9.5" (1.765 m), weight 177 lb 12 oz (80.6 kg), SpO2 99 %.  Physical Exam Constitutional:      General: He is not in acute distress.    Appearance: Normal appearance. He is well-developed. He is not ill-appearing or toxic-appearing.  HENT:     Head: Normocephalic and atraumatic.     Right Ear: Hearing, tympanic membrane, ear canal and external ear normal.     Left  Ear: Hearing, tympanic membrane, ear canal and external ear normal.     Nose: Nose normal.     Mouth/Throat:     Pharynx: Uvula midline.  Eyes:     General: Lids are normal. Lids are everted, no foreign bodies appreciated.     Conjunctiva/sclera: Conjunctivae normal.     Pupils: Pupils are equal, round, and reactive to light.  Neck:     Musculoskeletal: Normal range of motion and neck supple.     Thyroid: No thyroid mass or thyromegaly.     Vascular: No carotid bruit.     Trachea: Trachea and phonation normal.  Cardiovascular:     Rate and Rhythm: Normal rate and regular rhythm.     Pulses: Normal pulses.     Heart sounds: S1 normal and S2 normal. No murmur. No gallop.   Pulmonary:     Breath sounds: Normal breath sounds. No wheezing, rhonchi or rales.  Abdominal:     General: Bowel sounds are normal.     Palpations: Abdomen is soft.     Tenderness: There is no abdominal tenderness. There is no guarding or rebound.     Hernia: No hernia is present.  Musculoskeletal:     Left knee: He exhibits decreased range of motion, swelling, deformity and bony tenderness. He exhibits no effusion, no erythema and normal meniscus. Tenderness found. Lateral joint line and LCL tenderness noted. No MCL and no patellar tendon tenderness noted.   Lymphadenopathy:     Cervical: No cervical adenopathy.  Skin:    General: Skin is warm and dry.     Findings: No rash.  Neurological:     Mental Status: He is alert.     Cranial Nerves: No cranial nerve deficit.     Sensory: No sensory deficit.     Gait: Gait normal.     Deep Tendon Reflexes: Reflexes are normal and symmetric.  Psychiatric:        Speech: Speech normal.        Behavior: Behavior normal.        Judgment: Judgment normal.      Assessment and Plan  The patient's preventative maintenance and recommended screening tests for an annual wellness exam were reviewed in full today. Brought up to date unless services declined.  Counselled on the importance of diet, exercise, and its role in overall health and mortality. The patient's FH and SH was reviewed, including their home life, tobacco status, and drug and alcohol status.   Vaccines: Uptodate, flu uptodate.  Nonsmoker   Lab Results  Component Value Date   PSA 1.07 02/21/2019   PSA 0.64 02/13/2018   PSA 0.74 02/06/2017       Colon cancer screening: Colonoscopy 01/2019 nml, repeat in 5 years. Hep C: done with eval for increase LFTs. HIV: neg test   OSA (obstructive sleep apnea) Stable on INSPIRE  Hyperlipidemia Well controlled. Continue current medication.   Hypothyroidism increase dose of levothyroxine given fatigue.   IgA nephropathy Followed with renal.  Osteoarthritis of left knee Followed  By ORTHo. Steroid injections help minimally. NSAIDs contraindicated, topical diclofenac not helpful.  Return to [facility] for other option such as repair or synvisc. Vicodin prn severe pain.     Shawn Lofts, MD

## 2019-02-26 NOTE — Assessment & Plan Note (Signed)
Followed  By ORTHo. Steroid injections help minimally. NSAIDs contraindicated, topical diclofenac not helpful.  Return to [facility] for other option such as repair or synvisc. Vicodin prn severe pain.

## 2019-02-27 ENCOUNTER — Telehealth: Payer: Self-pay | Admitting: *Deleted

## 2019-02-27 LAB — CBC, NO DIFFERENTIAL/PLATELET
Hematocrit: 43 % (ref 37.5–51.0)
Hemoglobin: 15 g/dL (ref 13.0–17.7)
MCH: 32.3 pg (ref 26.6–33.0)
MCHC: 34.9 g/dL (ref 31.5–35.7)
MCV: 93 fL (ref 79–97)
RBC: 4.64 x10E6/uL (ref 4.14–5.80)
RDW: 12.8 % (ref 11.6–15.4)
WBC: 6.3 10*3/uL (ref 3.4–10.8)

## 2019-02-27 MED ORDER — LEVOTHYROXINE SODIUM 50 MCG PO TABS: 50.0000 ug | ORAL_TABLET | Freq: Every day | ORAL | 3 refills | Status: DC

## 2019-02-27 NOTE — Telephone Encounter (Signed)
Rx sent in to [facility] [address]

## 2019-02-27 NOTE — Telephone Encounter (Signed)
Patient called stating that his Levothyroxine was suppose to be increased yesterday. Patient wants to know where the script was sent? Patient stated that a representative from [place] called about his medication and wanting to know the dose?  Patient stated that he told them it was increased to 50 Mg.  Patient stated that they told him that they have not gotten the new dose script yet. Patient stated that he would just prefer that [facility]

## 2019-02-27 NOTE — Telephone Encounter (Signed)
Shawn Lam notified that his new thyroid medication has been [facility] [address].

## 2019-02-28 ENCOUNTER — Ambulatory Visit (INDEPENDENT_AMBULATORY_CARE_PROVIDER_SITE_OTHER): Payer: Medicare Other | Admitting: *Deleted

## 2019-02-28 DIAGNOSIS — R002 Palpitations: Secondary | ICD-10-CM

## 2019-02-28 LAB — CUP PACEART REMOTE DEVICE CHECK
Date Time Interrogation Session: 20201119140453
Implantable Pulse Generator Implant Date: 20180202

## 2019-03-13 DIAGNOSIS — Z23 Encounter for immunization: Secondary | ICD-10-CM | POA: Diagnosis not present

## 2019-03-25 ENCOUNTER — Telehealth: Payer: Self-pay | Admitting: *Deleted

## 2019-03-25 NOTE — Telephone Encounter (Signed)
Okay to write note for him... He is higher risk given his CAD, age, sex, chronic kidney disease from IgA nephropathy,  and sleep apnea. Stamp and give to patient.

## 2019-03-25 NOTE — Telephone Encounter (Signed)
Letter written as instructed by Dr. Diona Browner.  Left message for [name] that his letter is ready to be picked up here at our office.

## 2019-03-25 NOTE — Telephone Encounter (Signed)
Patient called stating that he got notice from the  Unemployment agency yesterday stating that he needs a letter from his doctor stating that he is at high risk and should stay in isolation. Patient stated that he had to go out of work because of the pandemic and because of him being high risk. Patient stated that he needs the letter by tomorrow or he will not be able to continue his unemployment benefits. Advised patient that Dr. Diona Browner is out of the office, but will send message to her today.

## 2019-03-26 ENCOUNTER — Other Ambulatory Visit: Payer: Self-pay | Admitting: Family Medicine

## 2019-03-28 ENCOUNTER — Other Ambulatory Visit (INDEPENDENT_AMBULATORY_CARE_PROVIDER_SITE_OTHER): Payer: Medicare Other

## 2019-03-28 ENCOUNTER — Other Ambulatory Visit: Payer: Self-pay

## 2019-03-28 ENCOUNTER — Telehealth: Payer: Self-pay | Admitting: Family Medicine

## 2019-03-28 DIAGNOSIS — E039 Hypothyroidism, unspecified: Secondary | ICD-10-CM

## 2019-03-28 LAB — T4, FREE: Free T4: 0.68 ng/dL (ref 0.60–1.60)

## 2019-03-28 LAB — TSH: TSH: 4.07 u[IU]/mL (ref 0.35–4.50)

## 2019-03-28 LAB — T3, FREE: T3, Free: 2.2 pg/mL — ABNORMAL LOW (ref 2.3–4.2)

## 2019-03-28 NOTE — Telephone Encounter (Signed)
-----   Message from Ellamae Sia sent at 03/19/2019  3:27 PM EST ----- Regarding: lab orders for Thursday, 12.17.20 Lab orders, thyroid???

## 2019-03-29 NOTE — Progress Notes (Signed)
[facility] Summary Report / Loop Recorder 

## 2019-04-01 DIAGNOSIS — E039 Hypothyroidism, unspecified: Secondary | ICD-10-CM

## 2019-04-02 ENCOUNTER — Ambulatory Visit (INDEPENDENT_AMBULATORY_CARE_PROVIDER_SITE_OTHER): Payer: Medicare Other | Admitting: *Deleted

## 2019-04-02 DIAGNOSIS — R002 Palpitations: Secondary | ICD-10-CM

## 2019-04-02 LAB — CUP PACEART REMOTE DEVICE CHECK
Date Time Interrogation Session: 20201222140036
Implantable Pulse Generator Implant Date: 20180202

## 2019-04-03 ENCOUNTER — Other Ambulatory Visit: Payer: Self-pay | Admitting: Family Medicine

## 2019-04-03 ENCOUNTER — Other Ambulatory Visit: Payer: Self-pay | Admitting: Physician Assistant

## 2019-04-03 MED ORDER — LEVOTHYROXINE SODIUM 75 MCG PO TABS: 75.0000 ug | ORAL_TABLET | Freq: Every day | ORAL | 11 refills | Status: DC

## 2019-04-23 ENCOUNTER — Other Ambulatory Visit: Payer: Self-pay | Admitting: Family Medicine

## 2019-04-23 MED ORDER — FUROSEMIDE 20 MG PO TABS: 20.0000 mg | ORAL_TABLET | Freq: Every day | ORAL | 1 refills | Status: DC

## 2019-04-23 NOTE — Addendum Note (Signed)
Addended by: Carter Kitten on: 04/23/2019 02:04 PM   Modules accepted: Orders

## 2019-04-24 ENCOUNTER — Other Ambulatory Visit (INDEPENDENT_AMBULATORY_CARE_PROVIDER_SITE_OTHER): Payer: Medicare Other

## 2019-04-24 DIAGNOSIS — E039 Hypothyroidism, unspecified: Secondary | ICD-10-CM | POA: Diagnosis not present

## 2019-04-24 LAB — TSH: TSH: 1.32 u[IU]/mL (ref 0.35–4.50)

## 2019-04-24 LAB — T3, FREE: T3, Free: 2.6 pg/mL (ref 2.3–4.2)

## 2019-04-24 LAB — T4, FREE: Free T4: 0.84 ng/dL (ref 0.60–1.60)

## 2019-04-29 ENCOUNTER — Other Ambulatory Visit: Payer: Self-pay | Admitting: Family Medicine

## 2019-05-06 ENCOUNTER — Ambulatory Visit (INDEPENDENT_AMBULATORY_CARE_PROVIDER_SITE_OTHER): Payer: Medicare Other | Admitting: *Deleted

## 2019-05-06 DIAGNOSIS — R002 Palpitations: Secondary | ICD-10-CM | POA: Diagnosis not present

## 2019-05-06 LAB — CUP PACEART REMOTE DEVICE CHECK
Date Time Interrogation Session: 20210124231005
Implantable Pulse Generator Implant Date: 20180202

## 2019-05-11 ENCOUNTER — Other Ambulatory Visit: Payer: Self-pay | Admitting: Family Medicine

## 2019-06-01 ENCOUNTER — Ambulatory Visit: Payer: Medicare Other | Attending: Internal Medicine

## 2019-06-01 DIAGNOSIS — Z23 Encounter for immunization: Secondary | ICD-10-CM

## 2019-06-01 NOTE — Progress Notes (Signed)
   Covid-19 Vaccination Clinic  Name:  Shawn Lam    MRN: [id number] DOB: 02/19/1951  06/01/2019  [name] was observed post Covid-19 immunization for 15 minutes without incidence. He was provided with Vaccine Information Sheet and instruction to access the V-Safe system.   [name] was instructed to call 911 with any severe reactions post vaccine: [name] Difficulty breathing  . Swelling of your face and throat  . A fast heartbeat  . A bad rash all over your body  . Dizziness and weakness    Immunizations Administered    Name Date Dose VIS Date Route   Pfizer COVID-19 Vaccine 06/01/2019 11:28 AM 0.3 mL 03/22/2019 Intramuscular   Manufacturer: [place]   Lot: [id number]   [place]: [address]-0

## 2019-06-10 ENCOUNTER — Ambulatory Visit (INDEPENDENT_AMBULATORY_CARE_PROVIDER_SITE_OTHER): Payer: Medicare Other | Admitting: *Deleted

## 2019-06-10 DIAGNOSIS — R002 Palpitations: Secondary | ICD-10-CM | POA: Diagnosis not present

## 2019-06-10 LAB — CUP PACEART REMOTE DEVICE CHECK
Date Time Interrogation Session: 20210228232732
Implantable Pulse Generator Implant Date: 20180202

## 2019-06-11 NOTE — Progress Notes (Signed)
ILR Remote 

## 2019-06-12 DIAGNOSIS — H53453 Other localized visual field defect, bilateral: Secondary | ICD-10-CM | POA: Diagnosis not present

## 2019-06-12 DIAGNOSIS — H52221 Regular astigmatism, right eye: Secondary | ICD-10-CM | POA: Diagnosis not present

## 2019-06-12 DIAGNOSIS — H5212 Myopia, left eye: Secondary | ICD-10-CM | POA: Diagnosis not present

## 2019-06-12 DIAGNOSIS — H52222 Regular astigmatism, left eye: Secondary | ICD-10-CM | POA: Diagnosis not present

## 2019-06-12 DIAGNOSIS — H5201 Hypermetropia, right eye: Secondary | ICD-10-CM | POA: Diagnosis not present

## 2019-06-12 DIAGNOSIS — H524 Presbyopia: Secondary | ICD-10-CM | POA: Diagnosis not present

## 2019-06-12 DIAGNOSIS — H40023 Open angle with borderline findings, high risk, bilateral: Secondary | ICD-10-CM | POA: Diagnosis not present

## 2019-06-12 DIAGNOSIS — Z961 Presence of intraocular lens: Secondary | ICD-10-CM | POA: Diagnosis not present

## 2019-06-25 ENCOUNTER — Ambulatory Visit: Payer: Medicare Other | Attending: Internal Medicine

## 2019-06-25 DIAGNOSIS — Z23 Encounter for immunization: Secondary | ICD-10-CM

## 2019-06-25 NOTE — Progress Notes (Signed)
   Covid-19 Vaccination Clinic  Name:  Shawn Lam    MRN: [id number] DOB: 1951/02/05  06/25/2019  [name] was observed post Covid-19 immunization for 15 minutes without incident. He was provided with Vaccine Information Sheet and instruction to access the V-Safe system.   [name] was instructed to call 911 with any severe reactions post vaccine: [name] Difficulty breathing  . Swelling of face and throat  . A fast heartbeat  . A bad rash all over body  . Dizziness and weakness   Immunizations Administered    Name Date Dose VIS Date Route   Pfizer COVID-19 Vaccine 06/25/2019 11:13 AM 0.3 mL 03/22/2019 Intramuscular   Manufacturer: [place]   Lot: [id number]   [place]: [address]-5

## 2019-07-08 ENCOUNTER — Encounter: Payer: Self-pay | Admitting: Family Medicine

## 2019-07-08 ENCOUNTER — Other Ambulatory Visit: Payer: Self-pay

## 2019-07-08 ENCOUNTER — Ambulatory Visit (INDEPENDENT_AMBULATORY_CARE_PROVIDER_SITE_OTHER): Payer: Medicare Other | Admitting: Family Medicine

## 2019-07-08 VITALS — BP 130/62 | HR 62 | Temp 98.2°F | Ht 69.5 in | Wt 179.5 lb

## 2019-07-08 DIAGNOSIS — F411 Generalized anxiety disorder: Secondary | ICD-10-CM | POA: Diagnosis not present

## 2019-07-08 DIAGNOSIS — R61 Generalized hyperhidrosis: Secondary | ICD-10-CM | POA: Diagnosis not present

## 2019-07-08 DIAGNOSIS — R5383 Other fatigue: Secondary | ICD-10-CM

## 2019-07-08 DIAGNOSIS — F32A Depression, unspecified: Secondary | ICD-10-CM

## 2019-07-08 DIAGNOSIS — L239 Allergic contact dermatitis, unspecified cause: Secondary | ICD-10-CM | POA: Insufficient documentation

## 2019-07-08 DIAGNOSIS — F320 Major depressive disorder, single episode, mild: Secondary | ICD-10-CM

## 2019-07-08 MED ORDER — PREDNISONE 20 MG PO TABS: ORAL_TABLET | ORAL | 0 refills | Status: DC

## 2019-07-08 NOTE — Assessment & Plan Note (Signed)
Stop remeron as may be contributing and pt now on Ambein per sleep MD. If not improving consider re-eval TSH, cbc, vitamin levels etc... but at last check s in last few months normal.  Pt being treated with CPAP for sleep apnea.

## 2019-07-08 NOTE — Progress Notes (Signed)
Chief Complaint  Patient presents with  . Rash    Chest and under arms    History of Present Illness: HPI  69 year old male with complicated medical history presents for new onset rash.  He reports  Rash started 5 days ago.  Rash is on chest and under arms, small patch onf right leg.. itchy. No blister, no pustules.[name] appears like whelps.   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  No sick contact.  not on hands and legs.  Worse at night.[name] keeping him up at night itching.    Got COVID vaccine 2 weeks ago.   Has not treated with anything except lotion.   Anxiety getting worse in last 3 months. He is on Ambien ( prescribed by sleep MD 4-5 months ago),  also on remeron at night for sleep.  fluoxetine 60 mg daily. No longer seeing psychiatrist.   This visit occurred during the SARS-CoV-2 public health emergency.  Safety protocols were in place, including screening questions prior to the visit, additional usage of staff PPE, and extensive cleaning of exam room while observing appropriate contact time as indicated for disinfecting solutions.   COVID 19 screen:  No recent travel or known exposure to COVID19 The patient denies respiratory symptoms of COVID 19 at this time. The importance of social distancing was discussed today.     Review of Systems  Constitutional: Positive for malaise/fatigue. Negative for chills and fever.  HENT: Negative for congestion and ear pain.   Eyes: Negative for pain and redness.  Respiratory: Negative for cough and shortness of breath.   Cardiovascular: Negative for chest pain, palpitations and leg swelling.       Night sweats  Gastrointestinal: Negative for abdominal pain, blood in stool, constipation, diarrhea, nausea and vomiting.  Genitourinary: Negative for dysuria.  Musculoskeletal: Negative for falls and myalgias.  Skin: Negative for rash.  Neurological: Negative for dizziness.  Psychiatric/Behavioral: Negative for  depression. The patient is nervous/anxious.    feels tired after BMs.. regualr no constiaption, no diarrhea, no abdominal pain    Past Medical History:  Diagnosis Date  . Anemia   . Anxiety   . Asymptomatic LV dysfunction    50-55% by echo 06/2016  . Berger's disease   . CAD (coronary artery disease) 06/2005   PCI of LAD and RCA  . Depression   . Dilated aortic root (HCC)    4cm at sinus of Valsalva  . Hematuria   . Hyperlipidemia   . Hypothyroidism   . Insomnia   . Left ureteral stone   . Orthostatic hypotension    negative tilt table  . OSA (obstructive sleep apnea)    moderate with AHI 17/hr, uses CPAP nightly(01/15/19 - has Inspire implant)  . PVC's (premature ventricular contractions) 07/07/2016  . Renal disorder    PT IS SEEING DR. Lorrene Reid- NEPHROLOGIST  . Rosacea   . Urticaria     reports that he has never smoked. He has never used smokeless tobacco. He reports current alcohol use of about 1.0 standard drinks of alcohol per week. He reports that he does not use drugs.   Current Outpatient Medications:  .  alendronate (FOSAMAX) 70 MG tablet, TAKE 1 TABLET BY MOUTH EVERY 7 DAYS WITH A FULL GLASS OF WATER ON AN EMPTY STOMACH., Disp: 12 tablet, Rfl: 3 .  ASPIRIN 81 PO, Take by mouth daily., Disp: , Rfl:  .  atorvastatin (LIPITOR) 80 MG tablet, TAKE 1 TABLET DAILY, Disp:  90 tablet, Rfl: 3 .  clopidogrel (PLAVIX) 75 MG tablet, TAKE 1 TABLET DAILY, Disp: 90 tablet, Rfl: 3 .  cyclobenzaprine (FLEXERIL) 10 MG tablet, TAKE 1/2-1 TAB BY MOUTH AT BEDTIME AS NEEDED FOR MUSCLE SPASMS, Disp: 30 tablet, Rfl: 0 .  ferrous sulfate 325 (65 FE) MG tablet, TAKE 1 TABLET BY MOUTH 3 TIMES DAILY WITH MEALS., Disp: 270 tablet, Rfl: 3 .  FLUoxetine (PROZAC) 20 MG capsule, TAKE 1 CAPSULE DAILY. TAKE WITH 40MG  TO EQUAL 60MG     ONCE DAILY, Disp: 90 capsule, Rfl: 1 .  FLUoxetine (PROZAC) 40 MG capsule, TAKE 1 CAPSULE EVERY       MORNING, Disp: 90 capsule, Rfl: 1 .  furosemide (LASIX) 20 MG tablet, Take  1 tablet (20 mg total) by mouth daily., Disp: 90 tablet, Rfl: 1 .  HYDROcodone-acetaminophen (NORCO/VICODIN) 5-325 MG tablet, Take 1 tablet by mouth every 8 (eight) hours as needed for moderate pain., Disp: 15 tablet, Rfl: 0 .  hydrOXYzine (ATARAX/VISTARIL) 25 MG tablet, TAKE 1 TABLET BY MOUTH AT BEDTIME MAY MAKE DROWSY, Disp: , Rfl: 3 .  levothyroxine (SYNTHROID) 75 MCG tablet, Take 1 tablet (75 mcg total) by mouth daily before breakfast., Disp: 30 tablet, Rfl: 11 .  midodrine (PROAMATINE) 10 MG tablet, TAKE 1 TABLET (10 MG TOTAL) BY MOUTH three TIMES DAILY WITH BREAKFAST  LUNCH dinner, Disp: 270 tablet, Rfl: 3 .  mirtazapine (REMERON) 15 MG tablet, TAKE 1 TABLET BY MOUTH EVERYDAY AT BEDTIME, Disp: 90 tablet, Rfl: 1 .  nitroGLYCERIN (NITROSTAT) 0.4 MG SL tablet, Place 1 tablet (0.4 mg total) under the tongue every 5 (five) minutes as needed for chest pain., Disp: 10 tablet, Rfl: 1 .  tamsulosin (FLOMAX) 0.4 MG CAPS capsule, Take 0.4 mg by mouth at bedtime., Disp: , Rfl:  .  vitamin B-12 (CYANOCOBALAMIN) 1000 MCG tablet, Take 1,000 mcg by mouth daily. , Disp: , Rfl:  .  zolpidem (AMBIEN) 5 MG tablet, TAKE 1 TABLET BY MOUTH AT BEDTIME AS NEEDED FOR SLEEP, Disp: , Rfl:  .  pantoprazole (PROTONIX) 40 MG tablet, Take 1 tablet (40 mg total) by mouth 2 (two) times daily., Disp: 60 tablet, Rfl: 5   Observations/Objective: Blood pressure 130/62, pulse 62, temperature 98.2 F (36.8 C), temperature source Temporal, height 5' 9.5" (1.765 m), weight 179 lb 8 oz (81.4 kg), SpO2 98 %.  Physical Exam Constitutional:      Appearance: He is well-developed.  HENT:     Head: Normocephalic.     Right Ear: Hearing normal.     Left Ear: Hearing normal.     Nose: Nose normal.  Neck:     Thyroid: No thyroid mass or thyromegaly.     Vascular: No carotid bruit.     Trachea: Trachea normal.  Cardiovascular:     Rate and Rhythm: Normal rate and regular rhythm.     Pulses: Normal pulses.     Heart sounds: Heart  sounds not distant. No murmur. No friction rub. No gallop.      Comments: No peripheral edema Pulmonary:     Effort: Pulmonary effort is normal. No respiratory distress.     Breath sounds: Normal breath sounds.  Skin:    General: Skin is warm and dry.     Findings: Rash present. Rash is urticarial.       Psychiatric:        Speech: Speech normal.        Behavior: Behavior normal.  Thought Content: Thought content normal.      Assessment and Plan Allergic dermatitis  No clear cause but appears hive-like. Treat with oral antihistmine and oral prednsione.   Fatigue Stop remeron as may be contributing and pt now on Ambein per sleep MD. If not improving consider re-eval TSH, cbc, vitamin levels etc... but at last check s in last few months normal.  Pt being treated with CPAP for sleep apnea.  Night sweats Unclear cause, no unexpected weight loss, no red flags.   Generalized anxiety disorder Previously well controlled on prozac 60 mg daily.. per pt slight worsening. Has been on prozac for years. If not improving at follow up we will consider change of regimen.       [name], MD

## 2019-07-08 NOTE — Assessment & Plan Note (Signed)
No clear cause but appears hive-like. Treat with oral antihistmine and oral prednsione.

## 2019-07-08 NOTE — Assessment & Plan Note (Signed)
Unclear cause, no unexpected weight loss, no red flags.

## 2019-07-08 NOTE — Patient Instructions (Signed)
Stop Remeron at night in case causing fatigue or night sweats.  Complete a course of oral prednsione.  Start Claritin.

## 2019-07-08 NOTE — Assessment & Plan Note (Signed)
Previously well controlled on prozac 60 mg daily.. per pt slight worsening. Has been on prozac for years. If not improving at follow up we will consider change of regimen.

## 2019-07-15 ENCOUNTER — Other Ambulatory Visit: Payer: Self-pay | Admitting: Family Medicine

## 2019-07-15 ENCOUNTER — Ambulatory Visit (INDEPENDENT_AMBULATORY_CARE_PROVIDER_SITE_OTHER): Payer: Medicare Other | Admitting: *Deleted

## 2019-07-15 DIAGNOSIS — R002 Palpitations: Secondary | ICD-10-CM

## 2019-07-15 NOTE — Progress Notes (Signed)
Cardiology Office Note  Date:  07/16/2019   ID:  Shawn Lam, DOB 1951-03-03, MRN [id number]  PCP:  Jinny Sanders, MD   Chief Complaint  Patient presents with  . office visit    6 month F/U; Meds verbally reviewed with patient.    HPI:  Shawn Lam is a 69 y.o. male with a hx of  CAD s/p PCI of the LAD and RCA,  orthostatic hypotension,  dyslipidemia EF of 50-55% with inferior HK.  lymphedema  mildly dilated aortic root at 38 mm by echo 2018,  moderate OSA Who presents for routine follow-up of his coronary artery disease  On prior office visit he had dizziness, near syncope, Had a fall, fractured his left forearm Orthostatic hypotension Lasix dosing was decreased to every other day, midodrine up to 10 3 times daily  Loop monitor in place Downloads reviewed over the past year, no significant arrhythmia Dating back into 2019  Echocardiogram with ejection fraction 60 to 65% in August 2020  Takes lasix QOD No ankle swelling  Lab work reviewed CR 1.35  EKG personally reviewed by myself on todays visit Shows normal sinus rhythm rate 74 bpm no significant ST-T wave changes  Other past medical history reviewed In the hospital 11/2018 GI bleeding and chest pain vasovagal episode 2 days ago in the setting of GI bleed and chronic hypotension EGD with no complications this morning Case discussed with Dr. Allen Norris in detail, Clean-based ulcer no signs of clot or bleeding continue Florinef and midodrine at outpatient   Previous trial of CPAP but was intolerant and then was referred to ENT for consideration of inspire device.  On 12/08/2017 he underwent placement of inspire device without complication.     PMH:   has a past medical history of Anemia, Anxiety, Asymptomatic LV dysfunction, Berger's disease, CAD (coronary artery disease) (06/2005), Depression, Dilated aortic root ([facility]), Hematuria, Hyperlipidemia, Hypothyroidism, Insomnia, Left ureteral stone, Orthostatic  hypotension, OSA (obstructive sleep apnea), PVC's (premature ventricular contractions) (07/07/2016), Renal disorder, Rosacea, and Urticaria.  PSH:    Past Surgical History:  Procedure Laterality Date  . CARDIAC CATHETERIZATION  09/06/2013   [place]  . CARDIAC CATHETERIZATION    . COLONOSCOPY WITH PROPOFOL N/A 01/21/2019   Procedure: COLONOSCOPY WITH PROPOFOL;  Surgeon: Lucilla Lame, MD;  Location: [place];  Service: Endoscopy;  Laterality: N/A;  sleep apnea  . CORONARY ANGIOPLASTY  2004   STENT PLACEMENT  . CYSTOSCOPY WITH RETROGRADE PYELOGRAM, URETEROSCOPY AND STENT PLACEMENT Left 03/19/2014   Procedure: CYSTOSCOPY WITH RETROGRADE PYELOGRAM, URETEROSCOPY AND STENT PLACEMENT;  Surgeon: Junious Dresser, MD;  Location: [facility];  Service: Urology;  Laterality: Left;  . CYSTOSCOPY WITH RETROGRADE PYELOGRAM, URETEROSCOPY AND STENT PLACEMENT Bilateral 05/20/2015   Procedure:  CYSTOSCOPY WITH BILATERAL RETROGRADE PYELOGRAM,RIGHT  DIAGNOSTIC URETEROSCOPY ,LEFT URETEROSCOPY WITH HOLMIUM LASER  AND BILATERAL  STENT PLACEMENT ;  Surgeon: Alexis Frock, MD;  Location: [facility];  Service: Urology;  Laterality: Bilateral;  . DRUG INDUCED ENDOSCOPY N/A 10/20/2017   Procedure: DRUG INDUCED SLEEP ENDOSCOPY;  Surgeon: Melida Quitter, MD;  Location: [place];  Service: ENT;  Laterality: N/A;  . EP IMPLANTABLE DEVICE N/A 05/13/2016   Procedure: Loop Recorder Insertion;  Surgeon: Deboraha Sprang, MD;  Location: [facility];  Service: Cardiovascular;  Laterality: N/A;  . ESOPHAGOGASTRODUODENOSCOPY (EGD) WITH PROPOFOL N/A 11/30/2018   Procedure: ESOPHAGOGASTRODUODENOSCOPY (EGD) WITH PROPOFOL;  Surgeon: Jonathon Bellows, MD;  Location: [facility] ENDOSCOPY;  Service: Gastroenterology;  Laterality:  N/A;  . ESOPHAGOGASTRODUODENOSCOPY (EGD) WITH PROPOFOL N/A 12/01/2018   Procedure: ESOPHAGOGASTRODUODENOSCOPY (EGD) WITH PROPOFOL;  Surgeon: Lucilla Lame, MD;  Location: [facility] ENDOSCOPY;  Service: Endoscopy;   Laterality: N/A;  . HOLMIUM LASER APPLICATION Bilateral 08/11/9765   Procedure: HOLMIUM LASER APPLICATION;  Surgeon: Alexis Frock, MD;  Location: [facility];  Service: Urology;  Laterality: Bilateral;  . IMPLIMENTATION OF A HYPOGLOSSAL NERVE STIMULATOR  12/08/2017   [facility]   . LEFT HEART CATHETERIZATION WITH CORONARY ANGIOGRAM N/A 09/06/2013   Procedure: LEFT HEART CATHETERIZATION WITH CORONARY ANGIOGRAM;  Surgeon: Sinclair Grooms, MD;  Location: [facility] LAB;  Service: Cardiovascular;  Laterality: N/A;  . LEFT KNEE CAP  SURGERY ABOUT 40 YRS AGO  ? 1970    . STONE EXTRACTION WITH BASKET Left 03/19/2014   Procedure: STONE EXTRACTION WITH BASKET;  Surgeon: Junious Dresser, MD;  Location: [facility];  Service: Urology;  Laterality: Left;    Current Outpatient Medications  Medication Sig Dispense Refill  . alendronate (FOSAMAX) 70 MG tablet TAKE 1 TABLET BY MOUTH EVERY 7 DAYS WITH A FULL GLASS OF WATER ON AN EMPTY STOMACH. 12 tablet 3  . ASPIRIN 81 PO Take by mouth daily.    [name] atorvastatin (LIPITOR) 80 MG tablet TAKE 1 TABLET DAILY 90 tablet 3  . clopidogrel (PLAVIX) 75 MG tablet TAKE 1 TABLET DAILY 90 tablet 3  . cyclobenzaprine (FLEXERIL) 10 MG tablet TAKE 1/2-1 TAB BY MOUTH AT BEDTIME AS NEEDED FOR MUSCLE SPASMS 30 tablet 0  . ferrous sulfate 325 (65 FE) MG tablet TAKE 1 TABLET BY MOUTH 3 TIMES DAILY WITH MEALS. 270 tablet 3  . FLUoxetine (PROZAC) 20 MG capsule TAKE 1 CAPSULE DAILY. TAKE WITH 40MG  TO EQUAL 60MG     ONCE DAILY 90 capsule 0  . FLUoxetine (PROZAC) 40 MG capsule TAKE 1 CAPSULE EVERY       MORNING 90 capsule 0  . furosemide (LASIX) 20 MG tablet Take 1 tablet (20 mg total) by mouth as needed (As needed for ankle swelling). 90 tablet 3  . HYDROcodone-acetaminophen (NORCO/VICODIN) 5-325 MG tablet Take 1 tablet by mouth every 8 (eight) hours as needed for moderate pain. 15 tablet 0  . hydrOXYzine (ATARAX/VISTARIL) 25 MG tablet TAKE 1 TABLET BY MOUTH AT BEDTIME MAY MAKE DROWSY  3  .  levothyroxine (SYNTHROID) 75 MCG tablet Take 1 tablet (75 mcg total) by mouth daily before breakfast. 30 tablet 11  . midodrine (PROAMATINE) 10 MG tablet TAKE 1 TABLET (10 MG TOTAL) BY MOUTH three TIMES DAILY WITH BREAKFAST  LUNCH dinner 270 tablet 3  . mirtazapine (REMERON) 15 MG tablet TAKE 1 TABLET BY MOUTH EVERYDAY AT BEDTIME 90 tablet 1  . nitroGLYCERIN (NITROSTAT) 0.4 MG SL tablet Place 1 tablet (0.4 mg total) under the tongue every 5 (five) minutes as needed for chest pain. 10 tablet 1  . pantoprazole (PROTONIX) 40 MG tablet Take 1 tablet (40 mg total) by mouth 2 (two) times daily. 60 tablet 5  . tamsulosin (FLOMAX) 0.4 MG CAPS capsule Take 0.4 mg by mouth at bedtime.    . vitamin B-12 (CYANOCOBALAMIN) 1000 MCG tablet Take 1,000 mcg by mouth daily.     [name] zolpidem (AMBIEN) 5 MG tablet TAKE 1 TABLET BY MOUTH AT BEDTIME AS NEEDED FOR SLEEP    . predniSONE (DELTASONE) 20 MG tablet 3 tabs by mouth daily x 3 days, then 2 tabs by mouth daily x 2 days then 1 tab by mouth daily x 2 days 15  tablet 0   No current facility-administered medications for this visit.     Allergies:   Codeine and Tape   Social History:  The patient  reports that he has never smoked. He has never used smokeless tobacco. He reports current alcohol use of about 1.0 standard drinks of alcohol per week. He reports that he does not use drugs.   Family History:   family history includes Cancer in his father; Coronary artery disease in his mother; Diabetes in his brother; Heart attack in his mother; Heart disease in his mother; Hypertension in his mother; Lung cancer in his father.    Review of Systems: Review of Systems  Constitutional: Negative.   HENT: Negative.   Respiratory: Negative.   Cardiovascular: Negative.   Gastrointestinal: Negative.   Musculoskeletal: Negative.   Neurological: Negative.   Psychiatric/Behavioral: Negative.   All other systems reviewed and are negative.   PHYSICAL EXAM: VS:  BP 106/70  (BP Location: Left Arm, Patient Position: Sitting, Cuff Size: Normal)   Pulse 74   Ht 6' (1.829 m)   Wt 175 lb 6 oz (79.5 kg)   SpO2 98%   BMI 23.79 kg/m  , BMI Body mass index is 23.79 kg/m. GEN: Well nourished, well developed, in no acute distress HEENT: normal Neck: no JVD, carotid bruits, or masses Cardiac: RRR; no murmurs, rubs, or gallops,no edema  Respiratory:  clear to auscultation bilaterally, normal work of breathing GI: soft, nontender, nondistended, + BS MS: no deformity or atrophy Skin: warm and dry, no rash Neuro:  Strength and sensation are intact Psych: euthymic mood, full affect   Recent Labs: 12/01/2018: Magnesium 2.0 12/26/2018: Platelets 238 02/21/2019: ALT 20; BUN 16; Creatinine, Ser 1.35; Potassium 4.6; Sodium 141 02/26/2019: Hemoglobin 15.0 04/24/2019: TSH 1.32    Lipid Panel Lab Results  Component Value Date   CHOL 147 02/21/2019   HDL 68.80 02/21/2019   LDLCALC 66 02/21/2019   TRIG 62.0 02/21/2019      Wt Readings from Last 3 Encounters:  07/16/19 175 lb 6 oz (79.5 kg)  07/08/19 179 lb 8 oz (81.4 kg)  02/26/19 177 lb 12 oz (80.6 kg)      ASSESSMENT AND PLAN:  Problem List Items Addressed This Visit      Cardiology Problems   Hyperlipidemia   Relevant Medications   furosemide (LASIX) 20 MG tablet   CAD (coronary artery disease) - Primary   Relevant Medications   furosemide (LASIX) 20 MG tablet   Other Relevant Orders   EKG 12-Lead   Orthostatic hypotension   Relevant Medications   furosemide (LASIX) 20 MG tablet     Other   OSA (obstructive sleep apnea)   Palpitation    Other Visit Diagnoses    Hypercholesterolemia       Relevant Medications   furosemide (LASIX) 20 MG tablet      1.  OSA -   intolerant to CPAP therapy had the inspire device implanted last year Stable  2. CAD -  with stable angina s/p remote PCI of the LAD and RCA.   Denies anginal symptoms Cholesterol at goal, no medication changes made  3.   Orthostatic hypotension  Recommend we stop the Lasix every other day, take only as needed for ankle swelling Continue on midodrine 10 mg 3 times daily Weight stable Depending on blood pressures, may be able to wean midodrine back to 5 mg 3 times daily Need to hydrate to the summer  4.  Hyperlipidemia - Cholesterol  is at goal on the current lipid regimen. No changes to the medications were made.   Disposition:   F/U  6 months   Total encounter time more than 25 minutes  Greater than 50% was spent in counseling and coordination of care with the patient    Signed, Esmond Plants, M.D., Ph.D. [place]

## 2019-07-16 ENCOUNTER — Ambulatory Visit (INDEPENDENT_AMBULATORY_CARE_PROVIDER_SITE_OTHER): Payer: Medicare Other | Admitting: Cardiovascular Disease

## 2019-07-16 ENCOUNTER — Other Ambulatory Visit: Payer: Self-pay

## 2019-07-16 ENCOUNTER — Encounter: Payer: Self-pay | Admitting: Cardiovascular Disease

## 2019-07-16 VITALS — BP 106/70 | HR 74 | Ht 72.0 in | Wt 175.4 lb

## 2019-07-16 DIAGNOSIS — R002 Palpitations: Secondary | ICD-10-CM

## 2019-07-16 DIAGNOSIS — G4733 Obstructive sleep apnea (adult) (pediatric): Secondary | ICD-10-CM | POA: Diagnosis not present

## 2019-07-16 DIAGNOSIS — I951 Orthostatic hypotension: Secondary | ICD-10-CM

## 2019-07-16 DIAGNOSIS — E7800 Pure hypercholesterolemia, unspecified: Secondary | ICD-10-CM

## 2019-07-16 DIAGNOSIS — I25118 Atherosclerotic heart disease of native coronary artery with other forms of angina pectoris: Secondary | ICD-10-CM | POA: Diagnosis not present

## 2019-07-16 DIAGNOSIS — E782 Mixed hyperlipidemia: Secondary | ICD-10-CM | POA: Diagnosis not present

## 2019-07-16 LAB — CUP PACEART REMOTE DEVICE CHECK
Date Time Interrogation Session: 20210401003006
Implantable Pulse Generator Implant Date: 20180202

## 2019-07-16 MED ORDER — FUROSEMIDE 20 MG PO TABS: 20.0000 mg | ORAL_TABLET | ORAL | 3 refills | Status: DC | PRN

## 2019-07-16 NOTE — Patient Instructions (Addendum)
Medication Instructions:  Just take lasix as needed for ankle swelling Monitor blood pressure when standing If not too low, You might be able to go back on the midodrine 5 three times a day   If you need a refill on your cardiac medications before your next appointment, please call your pharmacy.    Lab work: No new labs needed   If you have labs (blood work) drawn today and your tests are completely normal, you will receive your results only by: [name] MyChart Message (if you have MyChart) OR . A paper copy in the mail If you have any lab test that is abnormal or we need to change your treatment, we will call you to review the results.   Testing/Procedures: No new testing needed   Follow-Up: At [facility], you and your health needs are our priority.  As part of our continuing mission to provide you with exceptional heart care, we have created designated Provider Care Teams.  These Care Teams include your primary Cardiologist (physician) and Advanced Practice Providers (APPs -  Physician Assistants and Nurse Practitioners) who all work together to provide you with the care you need, when you need it.  . You will need a follow up appointment in 12 months   . Providers on your designated Care Team:   . Murray Hodgkins, NP . Christell Faith, PA-C . Marrianne Mood, PA-C  Any Other Special Instructions Will Be Listed Below (If Applicable).  For educational health videos Log in to : [email or web address] Or : [email or web address], password : [email or web address]

## 2019-07-17 ENCOUNTER — Ambulatory Visit (INDEPENDENT_AMBULATORY_CARE_PROVIDER_SITE_OTHER): Payer: Medicare Other | Admitting: Dermatology

## 2019-07-17 DIAGNOSIS — R21 Rash and other nonspecific skin eruption: Secondary | ICD-10-CM | POA: Diagnosis not present

## 2019-07-17 DIAGNOSIS — L570 Actinic keratosis: Secondary | ICD-10-CM

## 2019-07-17 MED ORDER — TRIAMCINOLONE ACETONIDE 0.1 % EX CREA: 1.0000 "application " | TOPICAL_CREAM | Freq: Two times a day (BID) | CUTANEOUS | 0 refills | Status: DC

## 2019-07-17 NOTE — Patient Instructions (Signed)

## 2019-07-17 NOTE — Progress Notes (Signed)
   Follow-Up Visit   Subjective  Shawn Lam is a 69 y.o. male who presents for the following: Rash (of arms and chest. It is very itchy. Started about 2 weeks after 2nd COVID vaccine. Went to PCP and was given Prednisone which he finished last Saturday but it did not help.) and Other (Spot on right cheek that gets irritated.).    The following portions of the chart were reviewed this encounter and updated as appropriate:     Review of Systems: No other skin or systemic complaints.  Objective  Well appearing patient in no apparent distress; mood and affect are within normal limits.  A focused examination was performed including face, chest, arms. Relevant physical exam findings are noted in the Assessment and Plan.  Objective  Face, scalp, ears (15): Erythematous thin papules/macules with gritty scale.   Objective  Arms and chest: Patchy erythema with scale and lichenification and crusts.  Images        Assessment & Plan  AK (actinic keratosis) (15) Face, scalp, ears  Destruction of lesion - Face, scalp, ears Complexity: simple   Destruction method: cryotherapy   Informed consent: discussed and consent obtained   Timeout:  patient name, date of birth, surgical site, and procedure verified Lesion destroyed using liquid nitrogen: Yes   Region frozen until ice ball extended beyond lesion: Yes   Outcome: patient tolerated procedure well with no complications   Post-procedure details: wound care instructions given    Rash Arms and chest  Rash with pruritus -  Acute and severely symptomatic - temporally associated with COVID vaccination.  Not resolved with steroid course prescribed by PCP.  Ordered Medications: triamcinolone cream (KENALOG) 0.1 % Allegra 180 mg 1 po qd until follow up in 2 weeks.  Return in about 2 weeks (around 07/31/2019).    I, Ashok Cordia, CMA, am acting as scribe for Sarina Ser, MD . Documentation: I have reviewed the above  documentation for accuracy and completeness, and I agree with the above.  Sarina Ser, MD

## 2019-07-23 ENCOUNTER — Ambulatory Visit (INDEPENDENT_AMBULATORY_CARE_PROVIDER_SITE_OTHER): Payer: Medicare Other | Admitting: Family Medicine

## 2019-07-23 ENCOUNTER — Other Ambulatory Visit: Payer: Self-pay

## 2019-07-23 ENCOUNTER — Encounter: Payer: Self-pay | Admitting: Family Medicine

## 2019-07-23 VITALS — BP 108/64 | HR 77 | Temp 97.5°F | Ht 72.0 in | Wt 174.8 lb

## 2019-07-23 DIAGNOSIS — I25118 Atherosclerotic heart disease of native coronary artery with other forms of angina pectoris: Secondary | ICD-10-CM | POA: Diagnosis not present

## 2019-07-23 DIAGNOSIS — R5383 Other fatigue: Secondary | ICD-10-CM

## 2019-07-23 DIAGNOSIS — R61 Generalized hyperhidrosis: Secondary | ICD-10-CM

## 2019-07-23 DIAGNOSIS — F411 Generalized anxiety disorder: Secondary | ICD-10-CM | POA: Diagnosis not present

## 2019-07-23 LAB — CBC WITH DIFFERENTIAL/PLATELET
Basophils Absolute: 0.1 10*3/uL (ref 0.0–0.1)
Basophils Relative: 0.8 % (ref 0.0–3.0)
Eosinophils Absolute: 0.1 10*3/uL (ref 0.0–0.7)
Eosinophils Relative: 0.8 % (ref 0.0–5.0)
HCT: 42.4 % (ref 39.0–52.0)
Hemoglobin: 14 g/dL (ref 13.0–17.0)
Lymphocytes Relative: 14 % (ref 12.0–46.0)
Lymphs Abs: 1.1 10*3/uL (ref 0.7–4.0)
MCHC: 33 g/dL (ref 30.0–36.0)
MCV: 99.7 fl (ref 78.0–100.0)
Monocytes Absolute: 0.6 10*3/uL (ref 0.1–1.0)
Monocytes Relative: 6.9 % (ref 3.0–12.0)
Neutro Abs: 6.4 10*3/uL (ref 1.4–7.7)
Neutrophils Relative %: 77.5 % — ABNORMAL HIGH (ref 43.0–77.0)
Platelets: 199 10*3/uL (ref 150.0–400.0)
RBC: 4.25 Mil/uL (ref 4.22–5.81)
RDW: 14 % (ref 11.5–15.5)
WBC: 8.2 10*3/uL (ref 4.0–10.5)

## 2019-07-23 LAB — T3, FREE: T3, Free: 2.7 pg/mL (ref 2.3–4.2)

## 2019-07-23 LAB — TSH: TSH: 1.14 u[IU]/mL (ref 0.35–4.50)

## 2019-07-23 LAB — T4, FREE: Free T4: 1.04 ng/dL (ref 0.60–1.60)

## 2019-07-23 MED ORDER — BUSPIRONE HCL 10 MG PO TABS: 10.0000 mg | ORAL_TABLET | Freq: Three times a day (TID) | ORAL | 3 refills | Status: DC

## 2019-07-23 NOTE — Progress Notes (Signed)
Chief Complaint  Patient presents with  . Follow-up    Fatigue, night sweats, anxiety    History of Present Illness: HPI  69 year old male presents for follow up night sweats, fatigue and worsening anxiety.  Ongoing now in last few months.   At last OV thyroid normal.  Stopped remeron... he feels that he does not sleep as well now. He feels more tired off this because not sleeping well. He is using ambein for sleep.[name] gets 4-5 hours of sleep a night.  He denies depression, no SI.   He cannot relax, cannot settle down.  GAD7: 15/27  BP and HR well controlled now... on higher dose since 10 months.  This visit occurred during the SARS-CoV-2 public health emergency.  Safety protocols were in place, including screening questions prior to the visit, additional usage of staff PPE, and extensive cleaning of exam room while observing appropriate contact time as indicated for disinfecting solutions.   COVID 19 screen:  No recent travel or known exposure to COVID19 The patient denies respiratory symptoms of COVID 19 at this time. The importance of social distancing was discussed today.     Review of Systems  Constitutional: Positive for diaphoresis and malaise/fatigue. Negative for chills and fever.  HENT: Negative for congestion and ear pain.   Eyes: Negative for pain and redness.  Respiratory: Negative for cough and shortness of breath.   Cardiovascular: Negative for chest pain, palpitations and leg swelling.  Gastrointestinal: Negative for abdominal pain, blood in stool, constipation, diarrhea, nausea and vomiting.  Genitourinary: Negative for dysuria.  Musculoskeletal: Negative for falls and myalgias.  Skin: Negative for rash.  Neurological: Negative for dizziness, focal weakness and weakness.  Psychiatric/Behavioral: Negative for depression. The patient is nervous/anxious and has insomnia.       Past Medical History:  Diagnosis Date  . Anemia   . Anxiety   . Asymptomatic  LV dysfunction    50-55% by echo 06/2016  . Berger's disease   . CAD (coronary artery disease) 06/2005   PCI of LAD and RCA  . Depression   . Dilated aortic root ([facility])    4cm at sinus of Valsalva  . Hematuria   . Hyperlipidemia   . Hypothyroidism   . Insomnia   . Left ureteral stone   . Orthostatic hypotension    negative tilt table  . OSA (obstructive sleep apnea)    moderate with AHI 17/hr, uses CPAP nightly(01/15/19 - has Inspire implant)  . PVC's (premature ventricular contractions) 07/07/2016  . Renal disorder    PT IS SEEING DR. Lorrene Reid- NEPHROLOGIST  . Rosacea   . Urticaria     reports that he has never smoked. He has never used smokeless tobacco. He reports current alcohol use of about 1.0 standard drinks of alcohol per week. He reports that he does not use drugs.   Current Outpatient Medications:  .  alendronate (FOSAMAX) 70 MG tablet, TAKE 1 TABLET BY MOUTH EVERY 7 DAYS WITH A FULL GLASS OF WATER ON AN EMPTY STOMACH., Disp: 12 tablet, Rfl: 3 .  ASPIRIN 81 PO, Take by mouth daily., Disp: , Rfl:  .  atorvastatin (LIPITOR) 80 MG tablet, TAKE 1 TABLET DAILY, Disp: 90 tablet, Rfl: 3 .  clopidogrel (PLAVIX) 75 MG tablet, TAKE 1 TABLET DAILY, Disp: 90 tablet, Rfl: 3 .  cyclobenzaprine (FLEXERIL) 10 MG tablet, TAKE 1/2-1 TAB BY MOUTH AT BEDTIME AS NEEDED FOR MUSCLE SPASMS, Disp: 30 tablet, Rfl: 0 .  ferrous sulfate 325 (  65 FE) MG tablet, TAKE 1 TABLET BY MOUTH 3 TIMES DAILY WITH MEALS., Disp: 270 tablet, Rfl: 3 .  FLUoxetine (PROZAC) 20 MG capsule, TAKE 1 CAPSULE DAILY. TAKE WITH 40MG  TO EQUAL 60MG     ONCE DAILY, Disp: 90 capsule, Rfl: 0 .  FLUoxetine (PROZAC) 40 MG capsule, TAKE 1 CAPSULE EVERY       MORNING, Disp: 90 capsule, Rfl: 0 .  furosemide (LASIX) 20 MG tablet, Take 1 tablet (20 mg total) by mouth as needed (As needed for ankle swelling)., Disp: 90 tablet, Rfl: 3 .  HYDROcodone-acetaminophen (NORCO/VICODIN) 5-325 MG tablet, Take 1 tablet by mouth every 8 (eight) hours as  needed for moderate pain., Disp: 15 tablet, Rfl: 0 .  hydrOXYzine (ATARAX/VISTARIL) 25 MG tablet, TAKE 1 TABLET BY MOUTH AT BEDTIME MAY MAKE DROWSY, Disp: , Rfl: 3 .  levothyroxine (SYNTHROID) 75 MCG tablet, Take 1 tablet (75 mcg total) by mouth daily before breakfast., Disp: 30 tablet, Rfl: 11 .  midodrine (PROAMATINE) 10 MG tablet, TAKE 1 TABLET (10 MG TOTAL) BY MOUTH three TIMES DAILY WITH BREAKFAST  LUNCH dinner, Disp: 270 tablet, Rfl: 3 .  mirtazapine (REMERON) 15 MG tablet, TAKE 1 TABLET BY MOUTH EVERYDAY AT BEDTIME, Disp: 90 tablet, Rfl: 1 .  nitroGLYCERIN (NITROSTAT) 0.4 MG SL tablet, Place 1 tablet (0.4 mg total) under the tongue every 5 (five) minutes as needed for chest pain., Disp: 10 tablet, Rfl: 1 .  tamsulosin (FLOMAX) 0.4 MG CAPS capsule, Take 0.4 mg by mouth at bedtime., Disp: , Rfl:  .  triamcinolone cream (KENALOG) 0.1 %, Apply 1 application topically 2 (two) times daily. Avoid face, groin, underarms., Disp: 453 g, Rfl: 0 .  vitamin B-12 (CYANOCOBALAMIN) 1000 MCG tablet, Take 1,000 mcg by mouth daily. , Disp: , Rfl:  .  zolpidem (AMBIEN) 5 MG tablet, TAKE 1 TABLET BY MOUTH AT BEDTIME AS NEEDED FOR SLEEP, Disp: , Rfl:  .  pantoprazole (PROTONIX) 40 MG tablet, Take 1 tablet (40 mg total) by mouth 2 (two) times daily., Disp: 60 tablet, Rfl: 5   Observations/Objective: Blood pressure 108/64, pulse 77, temperature (!) 97.5 F (36.4 C), temperature source Temporal, height 6' (1.829 m), weight 174 lb 12.8 oz (79.3 kg), SpO2 98 %.  Physical Exam Constitutional:      Appearance: He is well-developed.  HENT:     Head: Normocephalic.     Right Ear: Hearing normal.     Left Ear: Hearing normal.     Nose: Nose normal.  Neck:     Thyroid: No thyroid mass or thyromegaly.     Vascular: No carotid bruit.     Trachea: Trachea normal.  Cardiovascular:     Rate and Rhythm: Normal rate and regular rhythm.     Pulses: Normal pulses.     Heart sounds: Heart sounds not distant. No murmur.  No friction rub. No gallop.      Comments: No peripheral edema Pulmonary:     Effort: Pulmonary effort is normal. No respiratory distress.     Breath sounds: Normal breath sounds.  Skin:    General: Skin is warm and dry.     Findings: No rash.  Psychiatric:        Mood and Affect: Mood is anxious.        Speech: Speech normal.        Behavior: Behavior normal.        Thought Content: Thought content normal.     Comments: Jittery, cannot sit still,  leg bouncing      Assessment and Plan   Night sweats Unclear cause.. eval with labs.  Fatigue Unclear cause.. eval with labs.  No clear cardio pulmonary change.  Generalized anxiety disorder He feels all his symptms stem from inadequately controlled anxiety that he continued to have despite max dose of fluoxetine.  IN buspar was helpful... we will add this back and follow up in 4 weeks.     [name], MD

## 2019-07-23 NOTE — Patient Instructions (Addendum)
Add buspar to current regimen.  Stay off remeron for now.  Please stop at the lab to have labs drawn.

## 2019-07-25 DIAGNOSIS — H524 Presbyopia: Secondary | ICD-10-CM | POA: Diagnosis not present

## 2019-07-25 DIAGNOSIS — H5212 Myopia, left eye: Secondary | ICD-10-CM | POA: Diagnosis not present

## 2019-07-25 DIAGNOSIS — H52222 Regular astigmatism, left eye: Secondary | ICD-10-CM | POA: Diagnosis not present

## 2019-07-25 DIAGNOSIS — H40023 Open angle with borderline findings, high risk, bilateral: Secondary | ICD-10-CM | POA: Diagnosis not present

## 2019-07-25 DIAGNOSIS — H53453 Other localized visual field defect, bilateral: Secondary | ICD-10-CM | POA: Diagnosis not present

## 2019-08-01 ENCOUNTER — Other Ambulatory Visit: Payer: Self-pay

## 2019-08-01 ENCOUNTER — Ambulatory Visit (INDEPENDENT_AMBULATORY_CARE_PROVIDER_SITE_OTHER): Payer: Medicare Other | Admitting: Dermatology

## 2019-08-01 DIAGNOSIS — T7840XD Allergy, unspecified, subsequent encounter: Secondary | ICD-10-CM

## 2019-08-01 DIAGNOSIS — L700 Acne vulgaris: Secondary | ICD-10-CM

## 2019-08-01 DIAGNOSIS — L739 Follicular disorder, unspecified: Secondary | ICD-10-CM | POA: Diagnosis not present

## 2019-08-01 MED ORDER — TRIAMCINOLONE ACETONIDE 0.1 % EX CREA: TOPICAL_CREAM | CUTANEOUS | 0 refills | Status: DC

## 2019-08-01 MED ORDER — DOXYCYCLINE HYCLATE 100 MG PO TABS: ORAL_TABLET | ORAL | 0 refills | Status: DC

## 2019-08-01 NOTE — Progress Notes (Signed)
   Follow-Up Visit   Subjective  Shawn Lam is a 69 y.o. male who presents for the following: rash recheck (rash has improved significantly since the previous visit - patient currently using TMC 0.1% cream BID and Allegra 180mg  po QD) and bumps (of the left neck - have been there for about 3 weeks now, tender to the touch).  The following portions of the chart were reviewed this encounter and updated as appropriate:  Tobacco  Allergies  Meds  Problems  Med Hx  Surg Hx  Fam Hx     Review of Systems:  No other skin or systemic complaints except as noted in HPI or Assessment and Plan.  Objective  Well appearing patient in no apparent distress; mood and affect are within normal limits.  A focused examination was performed including the trunk, neck, and extremities. Relevant physical exam findings are noted in the Assessment and Plan.  Objective  neck: Erythematous papules of the L neck   Objective  Trunk, extremities: Pink patches of the L antecubital and R ant shoulder   Assessment & Plan  Acne vulgaris neck  /folliculitis -  New problem and symptomatic.   Start Doxycycline 100mg  po BID x 1 week. Doxycycline should be taken with food to prevent nausea. Do not lay down for 30 minutes after taking. Be cautious with sun exposure and use good sun protection while on this medication.   doxycycline (VIBRA-TABS) 100 MG tablet - neck  Hypersensitivity reaction, subsequent encounter Trunk, extremities  To the COVID vaccine, active in some areas but improved overall -but persistent Continue TMC 0.1% cream BID to aa's. Avoid f/g/a. Continue Allegra 180mg  po QD.  triamcinolone cream (KENALOG) 0.1 % - Trunk, extremities  Return in about 6 months (around 01/31/2020).  Luther Redo, CMA, am acting as scribe for Sarina Ser, MD .  Documentation: I have reviewed the above documentation for accuracy and completeness, and I agree with the above.  Sarina Ser, MD

## 2019-08-01 NOTE — Patient Instructions (Signed)
Doxycycline should be taken with food to prevent nausea. Do not lay down for 30 minutes after taking. Be cautious with sun exposure and use good sun protection while on this medication.

## 2019-08-02 ENCOUNTER — Encounter: Payer: Self-pay | Admitting: Dermatology

## 2019-08-02 DIAGNOSIS — R82991 Hypocitraturia: Secondary | ICD-10-CM | POA: Diagnosis not present

## 2019-08-02 DIAGNOSIS — R948 Abnormal results of function studies of other organs and systems: Secondary | ICD-10-CM | POA: Diagnosis not present

## 2019-08-08 DIAGNOSIS — R82998 Other abnormal findings in urine: Secondary | ICD-10-CM | POA: Diagnosis not present

## 2019-08-08 DIAGNOSIS — R3916 Straining to void: Secondary | ICD-10-CM | POA: Diagnosis not present

## 2019-08-08 DIAGNOSIS — N200 Calculus of kidney: Secondary | ICD-10-CM | POA: Diagnosis not present

## 2019-08-13 DIAGNOSIS — G4710 Hypersomnia, unspecified: Secondary | ICD-10-CM | POA: Diagnosis not present

## 2019-08-14 ENCOUNTER — Other Ambulatory Visit: Payer: Self-pay | Admitting: Family Medicine

## 2019-08-14 DIAGNOSIS — G4733 Obstructive sleep apnea (adult) (pediatric): Secondary | ICD-10-CM | POA: Diagnosis not present

## 2019-08-14 DIAGNOSIS — G4700 Insomnia, unspecified: Secondary | ICD-10-CM | POA: Diagnosis not present

## 2019-08-15 LAB — CUP PACEART REMOTE DEVICE CHECK
Date Time Interrogation Session: 20210502003349
Implantable Pulse Generator Implant Date: 20180202

## 2019-08-19 ENCOUNTER — Ambulatory Visit (INDEPENDENT_AMBULATORY_CARE_PROVIDER_SITE_OTHER): Payer: Medicare Other | Admitting: *Deleted

## 2019-08-19 DIAGNOSIS — R002 Palpitations: Secondary | ICD-10-CM | POA: Diagnosis not present

## 2019-08-20 NOTE — Progress Notes (Signed)
[facility] Summary Report / Loop Recorder 

## 2019-08-21 ENCOUNTER — Other Ambulatory Visit: Payer: Self-pay | Admitting: Family Medicine

## 2019-08-22 ENCOUNTER — Encounter: Payer: Self-pay | Admitting: Family Medicine

## 2019-08-22 ENCOUNTER — Ambulatory Visit (INDEPENDENT_AMBULATORY_CARE_PROVIDER_SITE_OTHER): Payer: Medicare Other | Admitting: Family Medicine

## 2019-08-22 ENCOUNTER — Other Ambulatory Visit: Payer: Self-pay

## 2019-08-22 VITALS — BP 108/64 | HR 85 | Temp 97.4°F | Ht 72.0 in | Wt 173.0 lb

## 2019-08-22 DIAGNOSIS — I25118 Atherosclerotic heart disease of native coronary artery with other forms of angina pectoris: Secondary | ICD-10-CM | POA: Diagnosis not present

## 2019-08-22 DIAGNOSIS — L239 Allergic contact dermatitis, unspecified cause: Secondary | ICD-10-CM

## 2019-08-22 DIAGNOSIS — F411 Generalized anxiety disorder: Secondary | ICD-10-CM | POA: Diagnosis not present

## 2019-08-22 DIAGNOSIS — R21 Rash and other nonspecific skin eruption: Secondary | ICD-10-CM | POA: Diagnosis not present

## 2019-08-22 MED ORDER — TRIAMCINOLONE ACETONIDE 0.1 % EX CREA: 1.0000 "application " | TOPICAL_CREAM | Freq: Two times a day (BID) | CUTANEOUS | 0 refills | Status: DC

## 2019-08-22 NOTE — Patient Instructions (Signed)
Titrate up Buspar in 5 mg increments every 2-3 days to max of 20 mg three times daily.

## 2019-08-22 NOTE — Progress Notes (Signed)
Chief Complaint  Patient presents with  . Anxiety    4 week follow-up    History of Present Illness: HPI   69 year old male presents for 4 week follow up GAD.  At last OV added Buspar 10 mg TID to  regimen of high dose prozac 60 mg daily. Noted 50% in anxiety in last 2 weeks.  he is more settled. Able to handle stress better.  No sleep issues, wakes up at 3 Am but able to go back to sleep.  NO SE.   GAD 7 : Generalized Anxiety Score 08/22/2019  Nervous, Anxious, on Edge 2  Control/stop worrying 2  Worry too much - different things 1  Trouble relaxing 3  Restless 3  Easily annoyed or irritable 2  Afraid - awful might happen 0  Total GAD 7 Score 13  Anxiety Difficulty Somewhat difficult    PHQ2 0  Needs triamcinolone refill he use on rash consistent with chornic dermatitis/eczema  This visit occurred during the SARS-CoV-2 public health emergency.  Safety protocols were in place, including screening questions prior to the visit, additional usage of staff PPE, and extensive cleaning of exam room while observing appropriate contact time as indicated for disinfecting solutions.   COVID 19 screen:  No recent travel or known exposure to COVID19 The patient denies respiratory symptoms of COVID 19 at this time. The importance of social distancing was discussed today.     Review of Systems  Constitutional: Negative for chills and fever.  HENT: Negative for congestion and ear pain.   Eyes: Negative for pain and redness.  Respiratory: Negative for cough and shortness of breath.   Cardiovascular: Negative for chest pain, palpitations and leg swelling.  Gastrointestinal: Negative for abdominal pain, blood in stool, constipation, diarrhea, nausea and vomiting.  Genitourinary: Negative for dysuria.  Musculoskeletal: Negative for falls and myalgias.  Skin: Negative for rash.  Neurological: Negative for dizziness.  Psychiatric/Behavioral: Negative for depression. The patient is not  nervous/anxious.       Past Medical History:  Diagnosis Date  . Anemia   . Anxiety   . Asymptomatic LV dysfunction    50-55% by echo 06/2016  . Berger's disease   . CAD (coronary artery disease) 06/2005   PCI of LAD and RCA  . Depression   . Dilated aortic root (HCC)    4cm at sinus of Valsalva  . Hematuria   . Hyperlipidemia   . Hypothyroidism   . Insomnia   . Left ureteral stone   . Orthostatic hypotension    negative tilt table  . OSA (obstructive sleep apnea)    moderate with AHI 17/hr, uses CPAP nightly(01/15/19 - has Inspire implant)  . PVC's (premature ventricular contractions) 07/07/2016  . Renal disorder    PT IS SEEING DR. Lorrene Reid- NEPHROLOGIST  . Rosacea   . Urticaria     reports that he has never smoked. He has never used smokeless tobacco. He reports current alcohol use of about 1.0 standard drinks of alcohol per week. He reports that he does not use drugs.   Current Outpatient Medications:  .  alendronate (FOSAMAX) 70 MG tablet, TAKE 1 TABLET BY MOUTH EVERY 7 DAYS WITH A FULL GLASS OF WATER ON AN EMPTY STOMACH., Disp: 12 tablet, Rfl: 3 .  ASPIRIN 81 PO, Take by mouth daily., Disp: , Rfl:  .  atorvastatin (LIPITOR) 80 MG tablet, TAKE 1 TABLET DAILY, Disp: 90 tablet, Rfl: 3 .  busPIRone (BUSPAR) 10 MG tablet, Take  1 tablet (10 mg total) by mouth 3 (three) times daily., Disp: 90 tablet, Rfl: 3 .  clopidogrel (PLAVIX) 75 MG tablet, TAKE 1 TABLET DAILY, Disp: 90 tablet, Rfl: 3 .  cyclobenzaprine (FLEXERIL) 10 MG tablet, TAKE 1/2-1 TAB BY MOUTH AT BEDTIME AS NEEDED FOR MUSCLE SPASMS, Disp: 30 tablet, Rfl: 0 .  ferrous sulfate 325 (65 FE) MG tablet, TAKE 1 TABLET BY MOUTH 3 TIMES DAILY WITH MEALS., Disp: 270 tablet, Rfl: 3 .  FLUoxetine (PROZAC) 20 MG capsule, TAKE 1 CAPSULE DAILY. TAKE WITH 40MG  TO EQUAL 60MG     ONCE DAILY, Disp: 90 capsule, Rfl: 0 .  FLUoxetine (PROZAC) 40 MG capsule, TAKE 1 CAPSULE EVERY       MORNING, Disp: 90 capsule, Rfl: 0 .  furosemide (LASIX) 20  MG tablet, Take 1 tablet (20 mg total) by mouth as needed (As needed for ankle swelling)., Disp: 90 tablet, Rfl: 3 .  hydrOXYzine (ATARAX/VISTARIL) 25 MG tablet, TAKE 1 TABLET BY MOUTH AT BEDTIME MAY MAKE DROWSY, Disp: , Rfl: 3 .  levothyroxine (SYNTHROID) 75 MCG tablet, Take 1 tablet (75 mcg total) by mouth daily before breakfast., Disp: 30 tablet, Rfl: 11 .  midodrine (PROAMATINE) 10 MG tablet, TAKE 1 TABLET (10 MG TOTAL) BY MOUTH three TIMES DAILY WITH BREAKFAST  LUNCH dinner, Disp: 270 tablet, Rfl: 3 .  mirtazapine (REMERON) 15 MG tablet, TAKE 1 TABLET BY MOUTH EVERYDAY AT BEDTIME, Disp: 90 tablet, Rfl: 1 .  nitroGLYCERIN (NITROSTAT) 0.4 MG SL tablet, Place 1 tablet (0.4 mg total) under the tongue every 5 (five) minutes as needed for chest pain., Disp: 10 tablet, Rfl: 1 .  pantoprazole (PROTONIX) 40 MG tablet, TAKE 1 TABLET BY MOUTH TWICE A DAY, Disp: 180 tablet, Rfl: 1 .  tamsulosin (FLOMAX) 0.4 MG CAPS capsule, Take 0.4 mg by mouth at bedtime., Disp: , Rfl:  .  triamcinolone cream (KENALOG) 0.1 %, Apply 1 application topically 2 (two) times daily. Avoid face, groin, underarms., Disp: 453 g, Rfl: 0 .  vitamin B-12 (CYANOCOBALAMIN) 1000 MCG tablet, Take 1,000 mcg by mouth daily. , Disp: , Rfl:  .  zolpidem (AMBIEN) 5 MG tablet, TAKE 1 TABLET BY MOUTH AT BEDTIME AS NEEDED FOR SLEEP, Disp: , Rfl:    Observations/Objective: Blood pressure 108/64, pulse 85, temperature (!) 97.4 F (36.3 C), height 6' (1.829 m), weight 173 lb (78.5 kg), SpO2 96 %.  Physical Exam Constitutional:      Appearance: He is well-developed.  HENT:     Head: Normocephalic.     Right Ear: Hearing normal.     Left Ear: Hearing normal.     Nose: Nose normal.  Neck:     Thyroid: No thyroid mass or thyromegaly.     Vascular: No carotid bruit.     Trachea: Trachea normal.  Cardiovascular:     Rate and Rhythm: Normal rate and regular rhythm.     Pulses: Normal pulses.     Heart sounds: Heart sounds not distant. No  murmur. No friction rub. No gallop.      Comments: No peripheral edema Pulmonary:     Effort: Pulmonary effort is normal. No respiratory distress.     Breath sounds: Normal breath sounds.  Skin:    General: Skin is warm and dry.     Findings: No rash.     Comments: erythematous plaque on right posterior leg.. per pt goes away in one area and pops up itchy in another  Psychiatric:  Speech: Speech normal.        Behavior: Behavior normal.        Thought Content: Thought content normal.      Assessment and Plan    Generalized anxiety disorder Improved control but not at goal.. increase Buspar up every 2-3 days by 5 mg. IWll need new prescription before long.. pt will call for refill at dose he tolerates best.  Allergic dermatitis Refill topical steroid cream.. do not use for > 2 weeks in one area.    [name], MD

## 2019-08-22 NOTE — Assessment & Plan Note (Signed)
Improved control but not at goal.. increase Buspar up every 2-3 days by 5 mg. IWll need new prescription before long.. pt will call for refill at dose he tolerates best.

## 2019-08-22 NOTE — Assessment & Plan Note (Signed)
Refill topical steroid cream.. do not use for > 2 weeks in one area.

## 2019-08-23 NOTE — Assessment & Plan Note (Signed)
He feels all his symptms stem from inadequately controlled anxiety that he continued to have despite max dose of fluoxetine.  IN buspar was helpful... we will add this back and follow up in 4 weeks.

## 2019-08-23 NOTE — Assessment & Plan Note (Signed)
Unclear cause.. eval with labs.  No clear cardio pulmonary change.

## 2019-08-23 NOTE — Assessment & Plan Note (Signed)
Unclear cause.. eval with labs.

## 2019-09-05 MED ORDER — BUSPIRONE HCL 10 MG PO TABS: 20.0000 mg | ORAL_TABLET | Freq: Three times a day (TID) | ORAL | 3 refills | Status: DC

## 2019-09-23 ENCOUNTER — Ambulatory Visit (INDEPENDENT_AMBULATORY_CARE_PROVIDER_SITE_OTHER): Payer: Medicare Other | Admitting: *Deleted

## 2019-09-23 ENCOUNTER — Other Ambulatory Visit: Payer: Self-pay | Admitting: Family Medicine

## 2019-09-23 DIAGNOSIS — R002 Palpitations: Secondary | ICD-10-CM | POA: Diagnosis not present

## 2019-09-23 LAB — CUP PACEART REMOTE DEVICE CHECK
Date Time Interrogation Session: 20210613235829
Implantable Pulse Generator Implant Date: 20180202

## 2019-09-23 NOTE — Telephone Encounter (Signed)
Last office visit 08/22/2019 for GAD.  Last refilled 03/26/2019 for #90 with 1 refill.  CPE scheduled for 03/03/2020.

## 2019-09-25 NOTE — Progress Notes (Signed)
[facility] Summary Report / Loop Recorder 

## 2019-09-28 ENCOUNTER — Other Ambulatory Visit: Payer: Self-pay | Admitting: Family Medicine

## 2019-10-07 ENCOUNTER — Other Ambulatory Visit: Payer: Self-pay | Admitting: *Deleted

## 2019-10-07 DIAGNOSIS — F32A Depression, unspecified: Secondary | ICD-10-CM

## 2019-10-07 DIAGNOSIS — F411 Generalized anxiety disorder: Secondary | ICD-10-CM

## 2019-10-07 MED ORDER — FLUOXETINE HCL 20 MG PO CAPS: ORAL_CAPSULE | ORAL | 1 refills | Status: DC

## 2019-10-07 MED ORDER — FLUOXETINE HCL 40 MG PO CAPS: 40.0000 mg | ORAL_CAPSULE | Freq: Every morning | ORAL | 1 refills | Status: DC

## 2019-10-28 ENCOUNTER — Ambulatory Visit (INDEPENDENT_AMBULATORY_CARE_PROVIDER_SITE_OTHER): Payer: Medicare Other | Admitting: *Deleted

## 2019-10-28 DIAGNOSIS — R002 Palpitations: Secondary | ICD-10-CM | POA: Diagnosis not present

## 2019-10-28 LAB — CUP PACEART REMOTE DEVICE CHECK
Date Time Interrogation Session: 20210718230622
Implantable Pulse Generator Implant Date: 20180202

## 2019-10-30 NOTE — Progress Notes (Signed)
[facility] Summary Report / Loop Recorder 

## 2019-11-27 DIAGNOSIS — R609 Edema, unspecified: Secondary | ICD-10-CM | POA: Diagnosis not present

## 2019-11-27 DIAGNOSIS — N200 Calculus of kidney: Secondary | ICD-10-CM | POA: Diagnosis not present

## 2019-11-27 DIAGNOSIS — I951 Orthostatic hypotension: Secondary | ICD-10-CM | POA: Diagnosis not present

## 2019-11-27 DIAGNOSIS — M810 Age-related osteoporosis without current pathological fracture: Secondary | ICD-10-CM | POA: Diagnosis not present

## 2019-11-27 DIAGNOSIS — I2510 Atherosclerotic heart disease of native coronary artery without angina pectoris: Secondary | ICD-10-CM | POA: Diagnosis not present

## 2019-11-27 DIAGNOSIS — N183 Chronic kidney disease, stage 3 unspecified: Secondary | ICD-10-CM | POA: Diagnosis not present

## 2019-11-27 DIAGNOSIS — E611 Iron deficiency: Secondary | ICD-10-CM | POA: Diagnosis not present

## 2019-12-01 LAB — CUP PACEART REMOTE DEVICE CHECK
Date Time Interrogation Session: 20210819000917
Implantable Pulse Generator Implant Date: 20180202

## 2019-12-02 ENCOUNTER — Ambulatory Visit (INDEPENDENT_AMBULATORY_CARE_PROVIDER_SITE_OTHER): Payer: Medicare Other | Admitting: *Deleted

## 2019-12-02 DIAGNOSIS — R002 Palpitations: Secondary | ICD-10-CM | POA: Diagnosis not present

## 2019-12-06 NOTE — Progress Notes (Signed)
[facility] Summary Report / Loop Recorder 

## 2019-12-15 ENCOUNTER — Other Ambulatory Visit: Payer: Self-pay | Admitting: Family Medicine

## 2019-12-19 ENCOUNTER — Other Ambulatory Visit: Payer: Self-pay | Admitting: Family Medicine

## 2019-12-20 ENCOUNTER — Other Ambulatory Visit: Payer: Self-pay | Admitting: Family Medicine

## 2019-12-20 DIAGNOSIS — R21 Rash and other nonspecific skin eruption: Secondary | ICD-10-CM

## 2019-12-20 NOTE — Telephone Encounter (Signed)
Last office visit 08/22/2019 for GAD.  Last refilled 08/22/2019 for 453 g with no refills.  CPE scheduled for 03/03/2020.

## 2020-01-01 ENCOUNTER — Encounter: Payer: Self-pay | Admitting: Family Medicine

## 2020-01-01 ENCOUNTER — Ambulatory Visit (INDEPENDENT_AMBULATORY_CARE_PROVIDER_SITE_OTHER): Payer: Medicare Other | Admitting: Family Medicine

## 2020-01-01 ENCOUNTER — Other Ambulatory Visit: Payer: Self-pay

## 2020-01-01 VITALS — BP 94/53 | HR 77 | Temp 98.3°F | Ht 72.0 in | Wt 169.5 lb

## 2020-01-01 DIAGNOSIS — Z23 Encounter for immunization: Secondary | ICD-10-CM

## 2020-01-01 DIAGNOSIS — I25118 Atherosclerotic heart disease of native coronary artery with other forms of angina pectoris: Secondary | ICD-10-CM | POA: Diagnosis not present

## 2020-01-01 DIAGNOSIS — M542 Cervicalgia: Secondary | ICD-10-CM | POA: Diagnosis not present

## 2020-01-01 DIAGNOSIS — G8929 Other chronic pain: Secondary | ICD-10-CM | POA: Diagnosis not present

## 2020-01-01 MED ORDER — GABAPENTIN 100 MG PO CAPS
200.0000 mg | ORAL_CAPSULE | Freq: Two times a day (BID) | ORAL | 1 refills | Status: DC
Start: 2020-01-01 — End: 2020-03-19

## 2020-01-01 MED ORDER — PREDNISONE 20 MG PO TABS: ORAL_TABLET | ORAL | 0 refills | Status: DC

## 2020-01-01 NOTE — Progress Notes (Signed)
Shawn Lam Fuerstenberg, MD, [place] at [facility] [place], [address]  Phone: [phone]  FAX: [phone]  Shawn Lam - 69 y.o. male  MRN [id number]  Date of Birth: [birth year]  Date: 01/01/2020  PCP: Jinny Sanders, MD  Referral: Jinny Sanders, MD  Chief Complaint  Patient presents with  . Neck Pain    on and off x 8 months but got bad last night  . Headache    This visit occurred during the SARS-CoV-2 public health emergency.  Safety protocols were in place, including screening questions prior to the visit, additional usage of staff PPE, and extensive cleaning of exam room while observing appropriate contact time as indicated for disinfecting solutions.   Subjective:   Shawn Lam is a 69 y.o. very pleasant male patient with Body mass index is 22.99 kg/m. who presents with the following:  He has been having neck pain for approximately 1 year, with some restriction of motion even lasting longer than that.  It is gotten particularly worse over the last few days and he is having pain predominantly in the occiput region and down more proximal than this.  He is having pain in the upper trap region as well as his paracervical spine region.  He is not having any numbness, tingling, radicular symptoms or strength alteration.  He does have a history of a prior kyphoplasty.  Bck of his neck pain, and his eyes were a  [place] shoulder and back.  Better than last night.  H/o kyphopasty    Review of Systems is noted in the HPI, as appropriate   Objective:   BP (!) 94/53   Pulse 77   Temp 98.3 F (36.8 C) (Temporal)   Ht 6' (1.829 m)   Wt 169 lb 8 oz (76.9 kg)   SpO2 98%   BMI 22.99 kg/m    GEN: Well-developed,well-nourished,in no acute distress; alert,appropriate and cooperative throughout examination HEENT: Normocephalic and atraumatic without obvious  abnormalities. Ears, externally formities PULM: Breathing comfortably in no respiratory distress EXT: No clubbing, cyanosis, or edema PSYCH: Normally interactive. Cooperative during the interview. Pleasant. Friendly and conversant. Not anxious or depressed appearing. Normal, full affect.  CERVICAL SPINE EXAM Range of motion: Flexion, extension, lateral bending, and rotation: Approximate 35 to 40% loss of motion in all directions Pain with terminal motion: Yes Spinous Processes: NT SCM: NT Upper paracervical muscles: Diffusely tender Upper traps: Mildly tender C5-T1 intact, sensation and motor   Radiology: No results found.  Assessment and Plan:     ICD-10-CM   1. Chronic neck pain  M54.2    G89.29   2. Need for influenza vaccination  Z23 Flu Vaccine QUAD High Dose(Fluad)   Chronic neck pain with acute exacerbation.  Reviewed basic range of motion, start with some Neurontin that he is already on then increase the dosing.  Pulse of some steroids with additional range of motion.  If symptoms persist then PT would be an appropriate next step.  Follow-up: No follow-ups on file.  Meds ordered this encounter  Medications  . gabapentin (NEURONTIN) 100 MG capsule    Sig: Take 2 capsules (200 mg total) by mouth 2 (two) times daily.    Dispense:  120 capsule    Refill:  1  . predniSONE (DELTASONE) 20 MG tablet    Sig: 2 tabs po daily for 5 days, then 1 tab po  daily for 5 days    Dispense:  15 tablet    Refill:  0   Medications Discontinued During This Encounter  Medication Reason  . gabapentin (NEURONTIN) 100 MG capsule Reorder   Orders Placed This Encounter  Procedures  . Flu Vaccine QUAD High Dose(Fluad)    Signed,  Burnis Kaser T. Inge Waldroup, MD   Outpatient Encounter Medications as of 01/01/2020  Medication Sig  . alendronate (FOSAMAX) 70 MG tablet TAKE 1 TABLET BY MOUTH EVERY 7 DAYS WITH A FULL GLASS OF WATER ON AN EMPTY STOMACH.  [name] ASPIRIN 81 PO Take by mouth daily.  [name]  atorvastatin (LIPITOR) 80 MG tablet TAKE 1 TABLET DAILY  . busPIRone (BUSPAR) 10 MG tablet TAKE 2 TABLETS (20 MG TOTAL) BY MOUTH 3 (THREE) TIMES DAILY.  [name] clopidogrel (PLAVIX) 75 MG tablet TAKE 1 TABLET DAILY  . cyclobenzaprine (FLEXERIL) 10 MG tablet TAKE 1/2-1 TAB BY MOUTH AT BEDTIME AS NEEDED FOR MUSCLE SPASMS  . ferrous sulfate 325 (65 FE) MG tablet TAKE 1 TABLET BY MOUTH 3 TIMES DAILY WITH MEALS.  [name] FLUoxetine (PROZAC) 20 MG capsule TAKE 1 CAPSULE DAILY. TAKE WITH 40MG  TO EQUAL 60MG     ONCE DAILY  . FLUoxetine (PROZAC) 40 MG capsule Take 1 capsule (40 mg total) by mouth every morning.  . furosemide (LASIX) 20 MG tablet Take 1 tablet (20 mg total) by mouth as needed (As needed for ankle swelling).  . gabapentin (NEURONTIN) 100 MG capsule Take 2 capsules (200 mg total) by mouth 2 (two) times daily.  . hydrOXYzine (ATARAX/VISTARIL) 25 MG tablet TAKE 1 TABLET BY MOUTH AT BEDTIME MAY MAKE DROWSY  . levothyroxine (SYNTHROID) 75 MCG tablet Take 1 tablet (75 mcg total) by mouth daily before breakfast.  . meloxicam (MOBIC) 15 MG tablet Take 15 mg by mouth daily.  . midodrine (PROAMATINE) 10 MG tablet TAKE 1 TABLET (10 MG TOTAL) BY MOUTH three TIMES DAILY WITH BREAKFAST  LUNCH dinner  . mirtazapine (REMERON) 15 MG tablet TAKE 1 TABLET BY MOUTH EVERYDAY AT BEDTIME  . nitroGLYCERIN (NITROSTAT) 0.4 MG SL tablet Place 1 tablet (0.4 mg total) under the tongue every 5 (five) minutes as needed for chest pain.  . pantoprazole (PROTONIX) 40 MG tablet TAKE 1 TABLET BY MOUTH TWICE A DAY  . tamsulosin (FLOMAX) 0.4 MG CAPS capsule Take 0.4 mg by mouth at bedtime.  . triamcinolone cream (KENALOG) 0.1 % APPLY 1 APPLICATION TOPICALLY 2 (TWO) TIMES DAILY. AVOID FACE, GROIN, UNDERARMS.  . vitamin B-12 (CYANOCOBALAMIN) 1000 MCG tablet Take 1,000 mcg by mouth daily.   [name] zolpidem (AMBIEN) 5 MG tablet TAKE 1 TABLET BY MOUTH AT BEDTIME AS NEEDED FOR SLEEP  . [DISCONTINUED] gabapentin (NEURONTIN) 100 MG capsule Take 100 mg  by mouth 2 (two) times daily.  . predniSONE (DELTASONE) 20 MG tablet 2 tabs po daily for 5 days, then 1 tab po daily for 5 days   No facility-administered encounter medications on file as of 01/01/2020.

## 2020-01-02 ENCOUNTER — Telehealth: Payer: Self-pay

## 2020-01-02 DIAGNOSIS — Z03818 Encounter for observation for suspected exposure to other biological agents ruled out: Secondary | ICD-10-CM | POA: Diagnosis not present

## 2020-01-02 DIAGNOSIS — Z20822 Contact with and (suspected) exposure to covid-19: Secondary | ICD-10-CM | POA: Diagnosis not present

## 2020-01-02 DIAGNOSIS — U071 COVID-19: Secondary | ICD-10-CM | POA: Diagnosis not present

## 2020-01-02 NOTE — Telephone Encounter (Signed)
Spoke to pt's wife. She will make an appt to have him tested for Covid.

## 2020-01-02 NOTE — Telephone Encounter (Signed)
Mrs Krueger call (DPR signed) called and said pt was seen 01/01/20 for neck pain and H/A (no better)and pt has started pred this morning and pt started last night with T 101.6, body aches, dry cough,weakness, slight S/T and chest congested; pt does not have CP,SOB or N&V. Pt had pfizer vaccines on 06/01/19 & 06/25/19. Pt had high dose flu shot on 01/01/20. [facility]. Pt wants to know if needs more med such as abx and tylenol or could this be reaction to flu shot or should pt get covid testing. Please advise.

## 2020-01-02 NOTE — Telephone Encounter (Signed)
Please call   I am doubtful that this is from his flu shot alone. It could possibly, but I worry a lot about Covid-19 that is going rampant in our community  He should definitely get a covid-19 test

## 2020-01-03 ENCOUNTER — Telehealth: Payer: Self-pay

## 2020-01-03 NOTE — Telephone Encounter (Signed)
Noted  

## 2020-01-03 NOTE — Telephone Encounter (Signed)
Pt was seen 01/01/2020 for neck pain... was advised to be tested for covid... pt wanted to let you know he went to [facility] for testing and he and his wife were positive.... ED precautions given also reasons for scheduling a virtual visit

## 2020-01-04 ENCOUNTER — Other Ambulatory Visit: Payer: Self-pay

## 2020-01-04 ENCOUNTER — Emergency Department: Payer: Medicare Other

## 2020-01-04 ENCOUNTER — Emergency Department
Admission: EM | Admit: 2020-01-04 | Discharge: 2020-01-04 | Disposition: A | Discharge status: Home / Self Care | Payer: Medicare Other | Source: Home / Self Care

## 2020-01-04 DIAGNOSIS — Z79890 Hormone replacement therapy (postmenopausal): Secondary | ICD-10-CM | POA: Insufficient documentation

## 2020-01-04 DIAGNOSIS — Z7982 Long term (current) use of aspirin: Secondary | ICD-10-CM | POA: Insufficient documentation

## 2020-01-04 DIAGNOSIS — J1282 Pneumonia due to coronavirus disease 2019: Secondary | ICD-10-CM | POA: Diagnosis not present

## 2020-01-04 DIAGNOSIS — R0602 Shortness of breath: Secondary | ICD-10-CM | POA: Diagnosis not present

## 2020-01-04 DIAGNOSIS — U071 COVID-19: Secondary | ICD-10-CM | POA: Diagnosis not present

## 2020-01-04 DIAGNOSIS — E039 Hypothyroidism, unspecified: Secondary | ICD-10-CM | POA: Insufficient documentation

## 2020-01-04 DIAGNOSIS — J9601 Acute respiratory failure with hypoxia: Secondary | ICD-10-CM | POA: Diagnosis not present

## 2020-01-04 DIAGNOSIS — R918 Other nonspecific abnormal finding of lung field: Secondary | ICD-10-CM | POA: Diagnosis not present

## 2020-01-04 DIAGNOSIS — J129 Viral pneumonia, unspecified: Secondary | ICD-10-CM | POA: Insufficient documentation

## 2020-01-04 DIAGNOSIS — N1831 Chronic kidney disease, stage 3a: Secondary | ICD-10-CM | POA: Diagnosis not present

## 2020-01-04 DIAGNOSIS — I2510 Atherosclerotic heart disease of native coronary artery without angina pectoris: Secondary | ICD-10-CM | POA: Insufficient documentation

## 2020-01-04 DIAGNOSIS — E785 Hyperlipidemia, unspecified: Secondary | ICD-10-CM | POA: Diagnosis not present

## 2020-01-04 DIAGNOSIS — Z79899 Other long term (current) drug therapy: Secondary | ICD-10-CM | POA: Insufficient documentation

## 2020-01-04 DIAGNOSIS — N183 Chronic kidney disease, stage 3 unspecified: Secondary | ICD-10-CM | POA: Insufficient documentation

## 2020-01-04 DIAGNOSIS — A4189 Other specified sepsis: Secondary | ICD-10-CM | POA: Diagnosis not present

## 2020-01-04 LAB — BASIC METABOLIC PANEL
Anion gap: 12 (ref 5–15)
BUN: 18 mg/dL (ref 8–23)
CO2: 21 mmol/L — ABNORMAL LOW (ref 22–32)
Calcium: 8.7 mg/dL — ABNORMAL LOW (ref 8.9–10.3)
Chloride: 103 mmol/L (ref 98–111)
Creatinine, Ser: 1.41 mg/dL — ABNORMAL HIGH (ref 0.61–1.24)
GFR calc Af Amer: 58 mL/min — ABNORMAL LOW (ref >60)
GFR calc non Af Amer: 50 mL/min — ABNORMAL LOW (ref >60)
Glucose, Bld: 131 mg/dL — ABNORMAL HIGH (ref 70–99)
Potassium: 3.7 mmol/L (ref 3.5–5.1)
Sodium: 136 mmol/L (ref 135–145)

## 2020-01-04 LAB — CBC
HCT: 41 % (ref 39.0–52.0)
Hemoglobin: 14.1 g/dL (ref 13.0–17.0)
MCH: 32.5 pg (ref 26.0–34.0)
MCHC: 34.4 g/dL (ref 30.0–36.0)
MCV: 94.5 fL (ref 80.0–100.0)
Platelets: 197 10*3/uL (ref 150–400)
RBC: 4.34 MIL/uL (ref 4.22–5.81)
RDW: 13.4 % (ref 11.5–15.5)
WBC: 8.4 10*3/uL (ref 4.0–10.5)
nRBC: 0 % (ref 0.0–0.2)

## 2020-01-04 MED ORDER — AZITHROMYCIN 250 MG PO TABS: ORAL_TABLET | ORAL | 0 refills | Status: DC

## 2020-01-04 MED ORDER — PREDNISONE 10 MG (21) PO TBPK: ORAL_TABLET | ORAL | 0 refills | Status: DC

## 2020-01-04 NOTE — ED Triage Notes (Signed)
First RN note: Pt presents to ED via [place] with his wife, pt's wife reports pt tested +for covid on Thursday, reports since then has fevers, states feels like patient is going to pass out, reports increasing and decreasing oxygen levels.   This RN obtained VS to assess patient at this time, explained visitation policy to patient's wife.

## 2020-01-04 NOTE — ED Provider Notes (Signed)
[facility] Emergency Department Provider Note ____________________________________________   None    (approximate)  I have reviewed the triage vital signs and the nursing notes.   HISTORY  Chief Complaint Weakness  HPI Shawn Lam is a 69 y.o. male  presents to the emergency department for treatment and evaluation of weakness, dizziness, and congestion.  He tested positive for COVID-19 on Thursday and since then has had intermittent fever and general fatigue and malaise.  He had a home O2 monitor which showed 87% on room air last night.  He is not on home oxygen at baseline.  He has not been on any medications after diagnosis of Covid..         Past Medical History:  Diagnosis Date  . Anemia   . Anxiety   . Asymptomatic LV dysfunction    50-55% by echo 06/2016  . Berger's disease   . CAD (coronary artery disease) 06/2005   PCI of LAD and RCA  . Depression   . Dilated aortic root (HCC)    4cm at sinus of Valsalva  . Hematuria   . Hyperlipidemia   . Hypothyroidism   . Insomnia   . Left ureteral stone   . Orthostatic hypotension    negative tilt table  . OSA (obstructive sleep apnea)    moderate with AHI 17/hr, uses CPAP nightly(01/15/19 - has Inspire implant)  . PVC's (premature ventricular contractions) 07/07/2016  . Renal disorder    PT IS SEEING DR. Lorrene Reid- NEPHROLOGIST  . Rosacea   . Urticaria     Patient Active Problem List   Diagnosis Date Noted  . Rash 08/22/2019  . Allergic dermatitis 07/08/2019  . Night sweats 07/08/2019  . Osteoarthritis of left knee 02/26/2019  . Personal history of colonic polyps   . Postbulbar duodenal ulcer   . Chest pain 11/29/2018  . Chronic kidney disease (CKD), stage III (moderate)   . Dark stools 11/09/2018  . Bilateral chronic knee pain 11/09/2018  . Chronic pain of right thumb 02/22/2018  . Acute bilateral thoracic back pain 01/23/2018  . Osteoporosis 02/10/2017  . Hypothyroidism 11/02/2016    . Thoracic compression fracture ([place]) 09/06/2016  . PVC's (premature ventricular contractions) 07/07/2016  . Hypoalbuminemia 06/28/2016  . IgA nephropathy 06/07/2016  . Palpitation 03/15/2016  . Mild depression ([place]) 11/20/2015  . OSA (obstructive sleep apnea) 02/05/2014  . Orthostatic hypotension 02/07/2013  . Anemia 01/31/2013  . Scotoma involving central area 01/29/2013  . Optic neuropathy 01/28/2013  . Fatigue 01/20/2011  . HEMATURIA UNSPECIFIED 01/21/2010  . Hyperlipidemia 07/05/2006  . Generalized anxiety disorder 07/05/2006  . CAD (coronary artery disease) 07/05/2006  . GERD 07/05/2006    Past Surgical History:  Procedure Laterality Date  . CARDIAC CATHETERIZATION  09/06/2013   [place]  . CARDIAC CATHETERIZATION    . COLONOSCOPY WITH PROPOFOL N/A 01/21/2019   Procedure: COLONOSCOPY WITH PROPOFOL;  Surgeon: Lucilla Lame, MD;  Location: [place];  Service: Endoscopy;  Laterality: N/A;  sleep apnea  . CORONARY ANGIOPLASTY  2004   STENT PLACEMENT  . CYSTOSCOPY WITH RETROGRADE PYELOGRAM, URETEROSCOPY AND STENT PLACEMENT Left 03/19/2014   Procedure: CYSTOSCOPY WITH RETROGRADE PYELOGRAM, URETEROSCOPY AND STENT PLACEMENT;  Surgeon: Junious Dresser, MD;  Location: [facility];  Service: Urology;  Laterality: Left;  . CYSTOSCOPY WITH RETROGRADE PYELOGRAM, URETEROSCOPY AND STENT PLACEMENT Bilateral 05/20/2015   Procedure:  CYSTOSCOPY WITH BILATERAL RETROGRADE PYELOGRAM,RIGHT  DIAGNOSTIC URETEROSCOPY ,LEFT URETEROSCOPY WITH HOLMIUM LASER  AND BILATERAL  STENT  PLACEMENT ;  Surgeon: Alexis Frock, MD;  Location: [facility];  Service: Urology;  Laterality: Bilateral;  . DRUG INDUCED ENDOSCOPY N/A 10/20/2017   Procedure: DRUG INDUCED SLEEP ENDOSCOPY;  Surgeon: Melida Quitter, MD;  Location: [place];  Service: ENT;  Laterality: N/A;  . EP IMPLANTABLE DEVICE N/A 05/13/2016   Procedure: Loop Recorder Insertion;  Surgeon: Deboraha Sprang, MD;  Location: [place] [facility];   Service: Cardiovascular;  Laterality: N/A;  . ESOPHAGOGASTRODUODENOSCOPY (EGD) WITH PROPOFOL N/A 11/30/2018   Procedure: ESOPHAGOGASTRODUODENOSCOPY (EGD) WITH PROPOFOL;  Surgeon: Jonathon Bellows, MD;  Location: [facility] ENDOSCOPY;  Service: Gastroenterology;  Laterality: N/A;  . ESOPHAGOGASTRODUODENOSCOPY (EGD) WITH PROPOFOL N/A 12/01/2018   Procedure: ESOPHAGOGASTRODUODENOSCOPY (EGD) WITH PROPOFOL;  Surgeon: Lucilla Lame, MD;  Location: [facility] ENDOSCOPY;  Service: Endoscopy;  Laterality: N/A;  . HOLMIUM LASER APPLICATION Bilateral 09/16/3417   Procedure: HOLMIUM LASER APPLICATION;  Surgeon: Alexis Frock, MD;  Location: [facility];  Service: Urology;  Laterality: Bilateral;  . IMPLIMENTATION OF A HYPOGLOSSAL NERVE STIMULATOR  12/08/2017   [facility]   . LEFT HEART CATHETERIZATION WITH CORONARY ANGIOGRAM N/A 09/06/2013   Procedure: LEFT HEART CATHETERIZATION WITH CORONARY ANGIOGRAM;  Surgeon: Sinclair Grooms, MD;  Location: [facility];  Service: Cardiovascular;  Laterality: N/A;  . LEFT KNEE CAP  SURGERY ABOUT 40 YRS AGO  ? 1970    . STONE EXTRACTION WITH BASKET Left 03/19/2014   Procedure: STONE EXTRACTION WITH BASKET;  Surgeon: Junious Dresser, MD;  Location: [facility];  Service: Urology;  Laterality: Left;    Prior to Admission medications   Medication Sig Start Date End Date Taking? Authorizing Provider  alendronate (FOSAMAX) 70 MG tablet TAKE 1 TABLET BY MOUTH EVERY 7 DAYS WITH A FULL GLASS OF WATER ON AN EMPTY STOMACH. 12/17/19   Bedsole, Amy E, MD  ASPIRIN 81 PO Take by mouth daily.    [provider]  atorvastatin (LIPITOR) 80 MG tablet TAKE 1 TABLET DAILY 04/23/19   Bedsole, Amy E, MD  azithromycin (ZITHROMAX) 250 MG tablet 2 tablets today, then 1 tablet for the next 4 days. 01/04/20   Shade Kaley B, FNP  busPIRone (BUSPAR) 10 MG tablet TAKE 2 TABLETS (20 MG TOTAL) BY MOUTH 3 (THREE) TIMES DAILY. 12/19/19   Bedsole, Amy E, MD  clopidogrel (PLAVIX) 75 MG tablet TAKE 1 TABLET DAILY 01/23/19   Gollan,  Kathlene November, MD  cyclobenzaprine (FLEXERIL) 10 MG tablet TAKE 1/2-1 TAB BY MOUTH AT BEDTIME AS NEEDED FOR MUSCLE SPASMS 01/14/19   Bedsole, Amy E, MD  ferrous sulfate 325 (65 FE) MG tablet TAKE 1 TABLET BY MOUTH 3 TIMES DAILY WITH MEALS. 02/19/19   Bedsole, Amy E, MD  FLUoxetine (PROZAC) 20 MG capsule TAKE 1 CAPSULE DAILY. TAKE WITH 40MG  TO EQUAL 60MG     ONCE DAILY 10/07/19   Diona Browner, Amy E, MD  FLUoxetine (PROZAC) 40 MG capsule Take 1 capsule (40 mg total) by mouth every morning. 10/07/19   Bedsole, Amy E, MD  furosemide (LASIX) 20 MG tablet Take 1 tablet (20 mg total) by mouth as needed (As needed for ankle swelling). 07/16/19   Minna Merritts, MD  gabapentin (NEURONTIN) 100 MG capsule Take 2 capsules (200 mg total) by mouth 2 (two) times daily. 01/01/20   [place], Frederico Hamman, MD  hydrOXYzine (ATARAX/VISTARIL) 25 MG tablet TAKE 1 TABLET BY MOUTH AT BEDTIME MAY MAKE DROWSY 02/14/18   [provider]  levothyroxine (SYNTHROID) 75 MCG tablet Take 1 tablet (75 mcg  total) by mouth daily before breakfast. 04/03/19   Bedsole, Amy E, MD  meloxicam (MOBIC) 15 MG tablet Take 15 mg by mouth daily. 09/10/19   [provider]  midodrine (PROAMATINE) 10 MG tablet TAKE 1 TABLET (10 MG TOTAL) BY MOUTH three TIMES DAILY WITH BREAKFAST  LUNCH dinner 01/14/19   Minna Merritts, MD  mirtazapine (REMERON) 15 MG tablet TAKE 1 TABLET BY MOUTH EVERYDAY AT BEDTIME 09/23/19   Bedsole, Amy E, MD  nitroGLYCERIN (NITROSTAT) 0.4 MG SL tablet Place 1 tablet (0.4 mg total) under the tongue every 5 (five) minutes as needed for chest pain. 03/30/18   Elby Beck, FNP  pantoprazole (PROTONIX) 40 MG tablet TAKE 1 TABLET BY MOUTH TWICE A DAY 08/21/19   Bedsole, Amy E, MD  predniSONE (STERAPRED UNI-PAK 21 TAB) 10 MG (21) TBPK tablet Take 6 tablets on the first day and decrease by 1 tablet each day until finished. 01/04/20   Zaley Talley, Johnette Abraham B, FNP  tamsulosin (FLOMAX) 0.4 MG CAPS capsule Take 0.4 mg by mouth at bedtime.  02/06/19   [provider]  triamcinolone cream (KENALOG) 0.1 % APPLY 1 APPLICATION TOPICALLY 2 (TWO) TIMES DAILY. AVOID FACE, GROIN, UNDERARMS. 12/20/19   Bedsole, Amy E, MD  vitamin B-12 (CYANOCOBALAMIN) 1000 MCG tablet Take 1,000 mcg by mouth daily.     [provider]  zolpidem (AMBIEN) 5 MG tablet TAKE 1 TABLET BY MOUTH AT BEDTIME AS NEEDED FOR SLEEP 09/09/18   [provider]    Allergies Codeine and Tape  Family History  Problem Relation Age of Onset  . Coronary artery disease Mother   . Heart attack Mother   . Heart disease Mother   . Hypertension Mother   . Cancer Father        lung and colon  . Lung cancer Father        smoker  . Diabetes Brother     Social History Social History   Tobacco Use  . Smoking status: Never Smoker  . Smokeless tobacco: Never Used  Vaping Use  . Vaping Use: Never used  Substance Use Topics  . Alcohol use: Yes    Alcohol/week: 1.0 standard drink    Types: 1 Cans of beer per week    Comment: Occasional beer  . Drug use: No    Review of Systems  Constitutional: Positive for fever and chills Eyes: No visual changes. ENT: No sore throat. Cardiovascular: Denies chest pain. Respiratory: Positive for shortness of breath. Gastrointestinal: No abdominal pain.  No nausea, no vomiting.  No diarrhea.  No constipation. Genitourinary: Negative for dysuria. Musculoskeletal: Positive for generalized body aches Skin: Negative for rash. Neurological: Positive for headache. ____________________________________________   PHYSICAL EXAM:  VITAL SIGNS: ED Triage Vitals  Enc Vitals Group     BP 01/04/20 1419 101/66     Pulse Rate 01/04/20 1419 94     Resp 01/04/20 1419 (!) 22     Temp 01/04/20 1419 99.4 F (37.4 C)     Temp Source 01/04/20 1419 Oral     SpO2 01/04/20 1419 99 %     Weight 01/04/20 1425 169 lb (76.7 kg)     Height 01/04/20 1425 6' (1.829 m)     Head Circumference --      Peak Flow --      Pain  Score 01/04/20 1425 8     Pain Loc --      Pain Edu? --      Excl.  in [place]? --     Constitutional: Alert and oriented.  Acutely ill appearing and in no acute distress. Eyes: Conjunctivae are normal.  Head: Atraumatic. Nose: No congestion/rhinnorhea. Mouth/Throat: Mucous membranes are moist.  Oropharynx non-erythematous. Neck: No stridor.   Hematological/Lymphatic/Immunilogical: No cervical lymphadenopathy. Cardiovascular: Normal rate, regular rhythm. Grossly normal heart sounds.  Good peripheral circulation. Respiratory: Normal respiratory effort.  No retractions. Lungs CTAB. Gastrointestinal: Soft and nontender. No distention. No abdominal bruits. No CVA tenderness. Genitourinary:  Musculoskeletal: No lower extremity tenderness nor edema.  No joint effusions. Neurologic:  Normal speech and language. No gross focal neurologic deficits are appreciated. No gait instability. Skin:  Skin is warm, dry and intact. No rash noted. Psychiatric: Mood and affect are normal. Speech and behavior are normal.  ____________________________________________   LABS (all labs ordered are listed, but only abnormal results are displayed)  Labs Reviewed  BASIC METABOLIC PANEL - Abnormal; Notable for the following components:      Result Value   CO2 21 (*)    Glucose, Bld 131 (*)    Creatinine, Ser 1.41 (*)    Calcium 8.7 (*)    GFR calc non Af Amer 50 (*)    GFR calc Af Amer 58 (*)    All other components within normal limits  CBC  URINALYSIS, COMPLETE (UACMP) WITH MICROSCOPIC  CBG MONITORING, ED   ____________________________________________  EKG  ED ECG REPORT I, Shawnette Augello, FNP-BC personally viewed and interpreted this ECG.   Date: 01/04/2020  EKG Time: 1425  Rate: 96  Rhythm: normal sinus rhythm  Axis: Normal  Intervals:none  ST&T Change: None  ____________________________________________  RADIOLOGY  ED MD interpretation:    Bilateral opacities left lung base and right  midlung suspicious for pneumonia in setting of COVID-19. I, Sherrie George, personally viewed and evaluated these images (plain radiographs) as part of my medical decision making, as well as reviewing the written report by the radiologist.  Official radiology report(s): DG Chest 2 View  Result Date: 01/04/2020 CLINICAL DATA:  Weakness.  COVID positive.  Cough. EXAM: CHEST - 2 VIEW COMPARISON:  Radiograph 11/30/2018 FINDINGS: Implanted loop recorder projects over the left chest wall. Nerve stimulator with battery pack projecting over the right chest, lead coursing into the neck unchanged. Heart is normal in size with unchanged mediastinal contours. Vague opacities at the left lung base and right midlung zone. Mild biapical pleuroparenchymal scarring. No pneumothorax or pneumomediastinum. No significant pleural effusion. Vertebral augmentation within lower thoracic vertebra. Chronic midthoracic compression fractures. No acute osseous abnormalities are seen. IMPRESSION: Vague opacities at the left lung base and right midlung zone, suspicious for pneumonia in the setting of COVID-19. Electronically Signed   By: Keith Rake M.D.   On: 01/04/2020 15:24    ____________________________________________   PROCEDURES  Procedure(s) performed (including Critical Care):  Procedures  ____________________________________________   INITIAL IMPRESSION / ASSESSMENT AND PLAN     69 year old male presenting to the emergency department for treatment and evaluation of shortness of breath and other symptoms as listed in the HPI in the setting of COVID-19.  While awaiting ER room assignment, chest x-ray and labs were drawn.  Will review.  DIFFERENTIAL DIAGNOSIS  Pneumonia, COVID-19, viral syndrome, CHF  ED COURSE  Chest x-ray consistent with bilateral pneumonia secondary to COVID-19.  Lab studies are overall reassuring.  No leukocytosis.  Plan will be to treat him with prednisone and azithromycin.  He is  to follow up with his primary care provider especially if symptoms  do not seem to be improving over the week.  He is to return to the emergency department if he notices that his oxygen saturation continues to decrease or he has other symptoms of concern if he is not able to see primary care.    ___________________________________________   FINAL CLINICAL IMPRESSION(S) / ED DIAGNOSES  Final diagnoses:  Pneumonia due to COVID-19 virus     ED Discharge Orders         Ordered    predniSONE (STERAPRED UNI-PAK 21 TAB) 10 MG (21) TBPK tablet        01/04/20 1716    azithromycin (ZITHROMAX) 250 MG tablet        01/04/20 1716           Shawn Lam was evaluated in Emergency Department on 01/04/2020 for the symptoms described in the history of present illness. He was evaluated in the context of the global COVID-19 pandemic, which necessitated consideration that the patient might be at risk for infection with the SARS-CoV-2 virus that causes COVID-19. Institutional protocols and algorithms that pertain to the evaluation of patients at risk for COVID-19 are in a state of rapid change based on information released by regulatory bodies including the CDC and federal and state organizations. These policies and algorithms were followed during the patient's care in the ED.   Note:  This document was prepared using Dragon voice recognition software and may include unintentional dictation errors.   Victorino Dike, FNP 01/04/20 2102    Blake Divine, MD 01/04/20 [phone]

## 2020-01-04 NOTE — ED Triage Notes (Signed)
See first nurse note. Pt states weakness, dizziness, congestion.

## 2020-01-04 NOTE — Discharge Instructions (Signed)
Stop the prednisone prescribed by primary care.  Take the medications as prescribed.  Return to the ER for symptoms that change or worsen if unable to see primary care.

## 2020-01-05 LAB — CUP PACEART REMOTE DEVICE CHECK
Date Time Interrogation Session: 20210919002912
Implantable Pulse Generator Implant Date: 20180202

## 2020-01-06 ENCOUNTER — Encounter: Payer: Self-pay | Admitting: Emergency Medicine

## 2020-01-06 ENCOUNTER — Ambulatory Visit (INDEPENDENT_AMBULATORY_CARE_PROVIDER_SITE_OTHER): Payer: Medicare Other | Admitting: Emergency Medicine

## 2020-01-06 ENCOUNTER — Other Ambulatory Visit: Payer: Self-pay

## 2020-01-06 ENCOUNTER — Telehealth: Payer: Self-pay | Admitting: Family Medicine

## 2020-01-06 ENCOUNTER — Emergency Department: Payer: Medicare Other

## 2020-01-06 ENCOUNTER — Inpatient Hospital Stay
Admission: EM | Admit: 2020-01-06 | Discharge: 2020-01-08 | DRG: 871 | Disposition: A | Discharge status: Home / Self Care | Payer: Medicare Other | Attending: Internal Medicine | Admitting: Internal Medicine

## 2020-01-06 DIAGNOSIS — F411 Generalized anxiety disorder: Secondary | ICD-10-CM | POA: Diagnosis present

## 2020-01-06 DIAGNOSIS — K219 Gastro-esophageal reflux disease without esophagitis: Secondary | ICD-10-CM | POA: Diagnosis present

## 2020-01-06 DIAGNOSIS — D509 Iron deficiency anemia, unspecified: Secondary | ICD-10-CM | POA: Diagnosis present

## 2020-01-06 DIAGNOSIS — U071 COVID-19: Secondary | ICD-10-CM | POA: Diagnosis not present

## 2020-01-06 DIAGNOSIS — Z833 Family history of diabetes mellitus: Secondary | ICD-10-CM

## 2020-01-06 DIAGNOSIS — J1282 Pneumonia due to coronavirus disease 2019: Secondary | ICD-10-CM | POA: Diagnosis not present

## 2020-01-06 DIAGNOSIS — N1831 Chronic kidney disease, stage 3a: Secondary | ICD-10-CM | POA: Diagnosis present

## 2020-01-06 DIAGNOSIS — Z7982 Long term (current) use of aspirin: Secondary | ICD-10-CM

## 2020-01-06 DIAGNOSIS — M1712 Unilateral primary osteoarthritis, left knee: Secondary | ICD-10-CM | POA: Diagnosis present

## 2020-01-06 DIAGNOSIS — R0902 Hypoxemia: Secondary | ICD-10-CM | POA: Diagnosis not present

## 2020-01-06 DIAGNOSIS — A419 Sepsis, unspecified organism: Secondary | ICD-10-CM | POA: Diagnosis not present

## 2020-01-06 DIAGNOSIS — Z8249 Family history of ischemic heart disease and other diseases of the circulatory system: Secondary | ICD-10-CM | POA: Diagnosis not present

## 2020-01-06 DIAGNOSIS — E785 Hyperlipidemia, unspecified: Secondary | ICD-10-CM | POA: Diagnosis present

## 2020-01-06 DIAGNOSIS — Z801 Family history of malignant neoplasm of trachea, bronchus and lung: Secondary | ICD-10-CM

## 2020-01-06 DIAGNOSIS — F329 Major depressive disorder, single episode, unspecified: Secondary | ICD-10-CM | POA: Diagnosis present

## 2020-01-06 DIAGNOSIS — Z791 Long term (current) use of non-steroidal anti-inflammatories (NSAID): Secondary | ICD-10-CM | POA: Diagnosis not present

## 2020-01-06 DIAGNOSIS — G4733 Obstructive sleep apnea (adult) (pediatric): Secondary | ICD-10-CM | POA: Diagnosis present

## 2020-01-06 DIAGNOSIS — N183 Chronic kidney disease, stage 3 unspecified: Secondary | ICD-10-CM | POA: Diagnosis present

## 2020-01-06 DIAGNOSIS — E039 Hypothyroidism, unspecified: Secondary | ICD-10-CM | POA: Diagnosis present

## 2020-01-06 DIAGNOSIS — R55 Syncope and collapse: Secondary | ICD-10-CM | POA: Diagnosis not present

## 2020-01-06 DIAGNOSIS — N1832 Chronic kidney disease, stage 3b: Secondary | ICD-10-CM | POA: Diagnosis present

## 2020-01-06 DIAGNOSIS — J9601 Acute respiratory failure with hypoxia: Secondary | ICD-10-CM | POA: Diagnosis present

## 2020-01-06 DIAGNOSIS — Z79899 Other long term (current) drug therapy: Secondary | ICD-10-CM

## 2020-01-06 DIAGNOSIS — I2510 Atherosclerotic heart disease of native coronary artery without angina pectoris: Secondary | ICD-10-CM | POA: Diagnosis present

## 2020-01-06 DIAGNOSIS — R918 Other nonspecific abnormal finding of lung field: Secondary | ICD-10-CM | POA: Diagnosis not present

## 2020-01-06 DIAGNOSIS — A4189 Other specified sepsis: Secondary | ICD-10-CM | POA: Diagnosis present

## 2020-01-06 DIAGNOSIS — Z885 Allergy status to narcotic agent status: Secondary | ICD-10-CM

## 2020-01-06 DIAGNOSIS — I951 Orthostatic hypotension: Secondary | ICD-10-CM | POA: Diagnosis present

## 2020-01-06 DIAGNOSIS — E876 Hypokalemia: Secondary | ICD-10-CM | POA: Diagnosis present

## 2020-01-06 DIAGNOSIS — R002 Palpitations: Secondary | ICD-10-CM | POA: Diagnosis not present

## 2020-01-06 DIAGNOSIS — R0602 Shortness of breath: Secondary | ICD-10-CM | POA: Diagnosis not present

## 2020-01-06 DIAGNOSIS — J189 Pneumonia, unspecified organism: Secondary | ICD-10-CM | POA: Diagnosis not present

## 2020-01-06 LAB — CBC WITH DIFFERENTIAL/PLATELET
Abs Immature Granulocytes: 0.03 10*3/uL (ref 0.00–0.07)
Basophils Absolute: 0 10*3/uL (ref 0.0–0.1)
Basophils Relative: 0 %
Eosinophils Absolute: 0 10*3/uL (ref 0.0–0.5)
Eosinophils Relative: 0 %
HCT: 40 % (ref 39.0–52.0)
Hemoglobin: 13.6 g/dL (ref 13.0–17.0)
Immature Granulocytes: 0 %
Lymphocytes Relative: 10 %
Lymphs Abs: 0.9 10*3/uL (ref 0.7–4.0)
MCH: 32.7 pg (ref 26.0–34.0)
MCHC: 34 g/dL (ref 30.0–36.0)
MCV: 96.2 fL (ref 80.0–100.0)
Monocytes Absolute: 0.4 10*3/uL (ref 0.1–1.0)
Monocytes Relative: 5 %
Neutro Abs: 7.5 10*3/uL (ref 1.7–7.7)
Neutrophils Relative %: 85 %
Platelets: 164 10*3/uL (ref 150–400)
RBC: 4.16 MIL/uL — ABNORMAL LOW (ref 4.22–5.81)
RDW: 13.5 % (ref 11.5–15.5)
WBC: 8.8 10*3/uL (ref 4.0–10.5)
nRBC: 0 % (ref 0.0–0.2)

## 2020-01-06 LAB — COMPREHENSIVE METABOLIC PANEL
ALT: 32 U/L (ref 0–44)
AST: 50 U/L — ABNORMAL HIGH (ref 15–41)
Albumin: 3.3 g/dL — ABNORMAL LOW (ref 3.5–5.0)
Alkaline Phosphatase: 50 U/L (ref 38–126)
Anion gap: 11 (ref 5–15)
BUN: 23 mg/dL (ref 8–23)
CO2: 23 mmol/L (ref 22–32)
Calcium: 8.5 mg/dL — ABNORMAL LOW (ref 8.9–10.3)
Chloride: 105 mmol/L (ref 98–111)
Creatinine, Ser: 1.45 mg/dL — ABNORMAL HIGH (ref 0.61–1.24)
GFR calc Af Amer: 57 mL/min — ABNORMAL LOW (ref >60)
GFR calc non Af Amer: 49 mL/min — ABNORMAL LOW (ref >60)
Glucose, Bld: 93 mg/dL (ref 70–99)
Potassium: 3 mmol/L — ABNORMAL LOW (ref 3.5–5.1)
Sodium: 139 mmol/L (ref 135–145)
Total Bilirubin: 0.8 mg/dL (ref 0.3–1.2)
Total Protein: 6.3 g/dL — ABNORMAL LOW (ref 6.5–8.1)

## 2020-01-06 LAB — RESPIRATORY PANEL BY RT PCR (FLU A&B, COVID)
Influenza A by PCR: NEGATIVE
Influenza B by PCR: NEGATIVE
SARS Coronavirus 2 by RT PCR: POSITIVE — AB

## 2020-01-06 LAB — LACTIC ACID, PLASMA
Lactic Acid, Venous: 1.3 mmol/L (ref 0.5–1.9)
Lactic Acid, Venous: 0.8 mmol/L (ref 0.5–1.9)

## 2020-01-06 LAB — LACTATE DEHYDROGENASE: LDH: 179 U/L (ref 98–192)

## 2020-01-06 LAB — TROPONIN I (HIGH SENSITIVITY)
Troponin I (High Sensitivity): 12 ng/L (ref <18)
Troponin I (High Sensitivity): 19 ng/L — ABNORMAL HIGH (ref <18)

## 2020-01-06 LAB — FIBRIN DERIVATIVES D-DIMER (ARMC ONLY): Fibrin derivatives D-dimer (ARMC): 953.77 ng/mL (FEU) — ABNORMAL HIGH (ref 0.00–499.00)

## 2020-01-06 LAB — BRAIN NATRIURETIC PEPTIDE: B Natriuretic Peptide: 46.3 pg/mL (ref 0.0–100.0)

## 2020-01-06 LAB — TRIGLYCERIDES: Triglycerides: 43 mg/dL (ref <150)

## 2020-01-06 LAB — FERRITIN: Ferritin: 414 ng/mL — ABNORMAL HIGH (ref 24–336)

## 2020-01-06 LAB — HIV ANTIBODY (ROUTINE TESTING W REFLEX): HIV Screen 4th Generation wRfx: NONREACTIVE

## 2020-01-06 LAB — HEPATITIS B SURFACE ANTIGEN: Hepatitis B Surface Ag: NONREACTIVE

## 2020-01-06 LAB — PROCALCITONIN: Procalcitonin: 0.11 ng/mL

## 2020-01-06 LAB — C-REACTIVE PROTEIN: CRP: 3.7 mg/dL — ABNORMAL HIGH (ref <1.0)

## 2020-01-06 LAB — FIBRINOGEN: Fibrinogen: 599 mg/dL — ABNORMAL HIGH (ref 210–475)

## 2020-01-06 MED ORDER — BARICITINIB 2 MG PO TABS
2.0000 mg | ORAL_TABLET | Freq: Every day | ORAL | Status: DC
  Administered 2020-01-06: 17:00:00 2 mg via ORAL
  Filled 2020-01-06 (×2): qty 1

## 2020-01-06 MED ORDER — ASCORBIC ACID 500 MG PO TABS
500.0000 mg | ORAL_TABLET | Freq: Every day | ORAL | Status: DC
  Administered 2020-01-06 – 2020-01-08 (×3): 500 mg via ORAL
  Filled 2020-01-06 (×5): qty 1

## 2020-01-06 MED ORDER — ACETAMINOPHEN 500 MG PO TABS
1000.0000 mg | ORAL_TABLET | Freq: Once | ORAL | Status: AC
  Administered 2020-01-06: 1000 mg via ORAL
  Filled 2020-01-06: qty 2

## 2020-01-06 MED ORDER — ACETAMINOPHEN 325 MG PO TABS
650.0000 mg | ORAL_TABLET | Freq: Four times a day (QID) | ORAL | Status: DC | PRN
  Administered 2020-01-07 (×2): 650 mg via ORAL
  Filled 2020-01-06 (×2): qty 2

## 2020-01-06 MED ORDER — ENOXAPARIN SODIUM 40 MG/0.4ML ~~LOC~~ SOLN
40.0000 mg | SUBCUTANEOUS | Status: DC
  Administered 2020-01-06 – 2020-01-08 (×3): 40 mg via SUBCUTANEOUS
  Filled 2020-01-06 (×4): qty 0.4

## 2020-01-06 MED ORDER — FLUOXETINE HCL 20 MG PO CAPS: 20.0000 mg | ORAL_CAPSULE | Freq: Every day | ORAL | Status: DC

## 2020-01-06 MED ORDER — INFLUENZA VAC A&B SA ADJ QUAD 0.5 ML IM PRSY
0.5000 mL | PREFILLED_SYRINGE | INTRAMUSCULAR | Status: DC
  Filled 2020-01-06: qty 0.5

## 2020-01-06 MED ORDER — IPRATROPIUM BROMIDE HFA 17 MCG/ACT IN AERS
2.0000 | INHALATION_SPRAY | RESPIRATORY_TRACT | Status: DC
  Administered 2020-01-06 – 2020-01-08 (×11): 2 via RESPIRATORY_TRACT
  Filled 2020-01-06 (×3): qty 12.9

## 2020-01-06 MED ORDER — FLUOXETINE HCL 20 MG PO CAPS
60.0000 mg | ORAL_CAPSULE | Freq: Every morning | ORAL | Status: DC
  Administered 2020-01-07 – 2020-01-08 (×2): 60 mg via ORAL
  Filled 2020-01-06 (×3): qty 3

## 2020-01-06 MED ORDER — ONDANSETRON HCL 4 MG/2ML IJ SOLN
4.0000 mg | Freq: Once | INTRAMUSCULAR | Status: AC
  Administered 2020-01-06: 4 mg via INTRAVENOUS
  Filled 2020-01-06: qty 2

## 2020-01-06 MED ORDER — DM-GUAIFENESIN ER 30-600 MG PO TB12
1.0000 | ORAL_TABLET | Freq: Two times a day (BID) | ORAL | Status: DC | PRN
  Administered 2020-01-06 – 2020-01-08 (×3): 1 via ORAL
  Filled 2020-01-06 (×3): qty 1

## 2020-01-06 MED ORDER — POTASSIUM CHLORIDE CRYS ER 20 MEQ PO TBCR
40.0000 meq | EXTENDED_RELEASE_TABLET | Freq: Once | ORAL | Status: AC
  Administered 2020-01-06: 40 meq via ORAL
  Filled 2020-01-06: qty 2

## 2020-01-06 MED ORDER — NITROGLYCERIN 0.4 MG SL SUBL: 0.4000 mg | SUBLINGUAL_TABLET | SUBLINGUAL | Status: DC | PRN

## 2020-01-06 MED ORDER — BUSPIRONE HCL 10 MG PO TABS
20.0000 mg | ORAL_TABLET | Freq: Three times a day (TID) | ORAL | Status: DC
  Administered 2020-01-06 – 2020-01-08 (×6): 20 mg via ORAL
  Filled 2020-01-06 (×9): qty 2

## 2020-01-06 MED ORDER — ZINC SULFATE 220 (50 ZN) MG PO CAPS
220.0000 mg | ORAL_CAPSULE | Freq: Every day | ORAL | Status: DC
  Administered 2020-01-06 – 2020-01-08 (×3): 220 mg via ORAL
  Filled 2020-01-06 (×4): qty 1

## 2020-01-06 MED ORDER — METHYLPREDNISOLONE SODIUM SUCC 125 MG IJ SOLR
125.0000 mg | Freq: Once | INTRAMUSCULAR | Status: AC
  Administered 2020-01-06: 125 mg via INTRAVENOUS
  Filled 2020-01-06: qty 2

## 2020-01-06 MED ORDER — GABAPENTIN 100 MG PO CAPS
200.0000 mg | ORAL_CAPSULE | Freq: Two times a day (BID) | ORAL | Status: DC
  Administered 2020-01-06 – 2020-01-08 (×4): 200 mg via ORAL
  Filled 2020-01-06 (×4): qty 2

## 2020-01-06 MED ORDER — MIRTAZAPINE 15 MG PO TABS
15.0000 mg | ORAL_TABLET | Freq: Every day | ORAL | Status: DC
  Administered 2020-01-06 – 2020-01-07 (×2): 15 mg via ORAL
  Filled 2020-01-06 (×2): qty 1

## 2020-01-06 MED ORDER — ATORVASTATIN CALCIUM 20 MG PO TABS
40.0000 mg | ORAL_TABLET | Freq: Every day | ORAL | Status: DC
  Administered 2020-01-06 – 2020-01-08 (×3): 40 mg via ORAL
  Filled 2020-01-06 (×3): qty 2

## 2020-01-06 MED ORDER — PANTOPRAZOLE SODIUM 40 MG PO TBEC
40.0000 mg | DELAYED_RELEASE_TABLET | Freq: Two times a day (BID) | ORAL | Status: DC
  Administered 2020-01-06 – 2020-01-08 (×4): 40 mg via ORAL
  Filled 2020-01-06 (×4): qty 1

## 2020-01-06 MED ORDER — HYDROXYZINE HCL 10 MG PO TABS
10.0000 mg | ORAL_TABLET | Freq: Three times a day (TID) | ORAL | Status: DC | PRN
  Filled 2020-01-06: qty 1

## 2020-01-06 MED ORDER — METHYLPREDNISOLONE SODIUM SUCC 40 MG IJ SOLR
40.0000 mg | Freq: Two times a day (BID) | INTRAMUSCULAR | Status: DC
  Administered 2020-01-06 – 2020-01-08 (×4): 40 mg via INTRAVENOUS
  Filled 2020-01-06 (×5): qty 1

## 2020-01-06 MED ORDER — SODIUM CHLORIDE 0.9 % IV SOLN
200.0000 mg | Freq: Once | INTRAVENOUS | Status: AC
  Administered 2020-01-06: 200 mg via INTRAVENOUS
  Filled 2020-01-06: qty 200

## 2020-01-06 MED ORDER — FERROUS SULFATE 325 (65 FE) MG PO TABS
325.0000 mg | ORAL_TABLET | Freq: Three times a day (TID) | ORAL | Status: DC
  Administered 2020-01-06 – 2020-01-08 (×6): 325 mg via ORAL
  Filled 2020-01-06 (×6): qty 1

## 2020-01-06 MED ORDER — CLOPIDOGREL BISULFATE 75 MG PO TABS
75.0000 mg | ORAL_TABLET | Freq: Every day | ORAL | Status: DC
  Administered 2020-01-06 – 2020-01-08 (×3): 75 mg via ORAL
  Filled 2020-01-06 (×3): qty 1

## 2020-01-06 MED ORDER — ALBUTEROL SULFATE HFA 108 (90 BASE) MCG/ACT IN AERS
2.0000 | INHALATION_SPRAY | RESPIRATORY_TRACT | Status: DC | PRN
  Filled 2020-01-06: qty 6.7

## 2020-01-06 MED ORDER — ZOLPIDEM TARTRATE 5 MG PO TABS
5.0000 mg | ORAL_TABLET | Freq: Every day | ORAL | Status: DC
  Administered 2020-01-06 – 2020-01-07 (×2): 5 mg via ORAL
  Filled 2020-01-06 (×2): qty 1

## 2020-01-06 MED ORDER — SODIUM CHLORIDE 0.9 % IV SOLN
100.0000 mg | Freq: Every day | INTRAVENOUS | Status: DC
  Administered 2020-01-07 – 2020-01-08 (×2): 100 mg via INTRAVENOUS
  Filled 2020-01-06 (×3): qty 20

## 2020-01-06 MED ORDER — VITAMIN B-12 1000 MCG PO TABS
1000.0000 ug | ORAL_TABLET | Freq: Every day | ORAL | Status: DC
  Administered 2020-01-06 – 2020-01-08 (×3): 1000 ug via ORAL
  Filled 2020-01-06 (×3): qty 1

## 2020-01-06 MED ORDER — MIDODRINE HCL 5 MG PO TABS
10.0000 mg | ORAL_TABLET | Freq: Three times a day (TID) | ORAL | Status: DC
  Administered 2020-01-06 – 2020-01-08 (×6): 10 mg via ORAL
  Filled 2020-01-06 (×5): qty 2

## 2020-01-06 MED ORDER — TAMSULOSIN HCL 0.4 MG PO CAPS
0.4000 mg | ORAL_CAPSULE | Freq: Every day | ORAL | Status: DC
  Administered 2020-01-06 – 2020-01-07 (×2): 0.4 mg via ORAL
  Filled 2020-01-06 (×2): qty 1

## 2020-01-06 NOTE — ED Triage Notes (Signed)
Pt to ED via EMS from home c/o SOB and weakness, states some abd pain with nausea but no vomiting as well.  Dx COVID and pneumonia on Saturday.  Found to be 91% RA by EMS and placed on 2L [place].  Presents A&Ox4, coughing, unlabored breathing.

## 2020-01-06 NOTE — ED Provider Notes (Addendum)
[facility] Emergency Department Provider Note  ____________________________________________  Time seen: Approximately 6:34 AM  I have reviewed the triage vital signs and the nursing notes.   HISTORY  Chief Complaint Shortness of Breath and Abdominal Pain   HPI Shawn Lam is a 69 y.o. male with a history of CHF with preserved EF, CAD,  OSA on CPAP, hypothyroidism, hyperlipidemia, anemia who presents from home for evaluation of shortness of breath.  Patient was diagnosed with Covid 4 days ago.  He has been having progressively worsening shortness of breath, diffuse throbbing headaches, nausea, cough, generalized weakness.  Unfortunately patient is fully vaccinated with [employer].  No diarrhea, no vomiting, no chest pain.  No prior history of PE or DVT, no recent travel immobilization, no leg pain or swelling, no hemoptysis or exogenous hormones.  Shortness of breath became more severe today which prompted him to call 911.  Per EMS patient was found to be 91% on room air.  Past Medical History:  Diagnosis Date  . Anemia   . Anxiety   . Asymptomatic LV dysfunction    50-55% by echo 06/2016  . Berger's disease   . CAD (coronary artery disease) 06/2005   PCI of LAD and RCA  . Depression   . Dilated aortic root (HCC)    4cm at sinus of Valsalva  . Hematuria   . Hyperlipidemia   . Hypothyroidism   . Insomnia   . Left ureteral stone   . Orthostatic hypotension    negative tilt table  . OSA (obstructive sleep apnea)    moderate with AHI 17/hr, uses CPAP nightly(01/15/19 - has Inspire implant)  . PVC's (premature ventricular contractions) 07/07/2016  . Renal disorder    PT IS SEEING DR. Lorrene Reid- NEPHROLOGIST  . Rosacea   . Urticaria     Patient Active Problem List   Diagnosis Date Noted  . Rash 08/22/2019  . Allergic dermatitis 07/08/2019  . Night sweats 07/08/2019  . Osteoarthritis of left knee 02/26/2019  . Personal history of colonic polyps   .  Postbulbar duodenal ulcer   . Chest pain 11/29/2018  . Chronic kidney disease (CKD), stage III (moderate)   . Dark stools 11/09/2018  . Bilateral chronic knee pain 11/09/2018  . Chronic pain of right thumb 02/22/2018  . Acute bilateral thoracic back pain 01/23/2018  . Osteoporosis 02/10/2017  . Hypothyroidism 11/02/2016  . Thoracic compression fracture ([place]) 09/06/2016  . PVC's (premature ventricular contractions) 07/07/2016  . Hypoalbuminemia 06/28/2016  . IgA nephropathy 06/07/2016  . Palpitation 03/15/2016  . Mild depression ([place]) 11/20/2015  . OSA (obstructive sleep apnea) 02/05/2014  . Orthostatic hypotension 02/07/2013  . Anemia 01/31/2013  . Scotoma involving central area 01/29/2013  . Optic neuropathy 01/28/2013  . Fatigue 01/20/2011  . HEMATURIA UNSPECIFIED 01/21/2010  . Hyperlipidemia 07/05/2006  . Generalized anxiety disorder 07/05/2006  . CAD (coronary artery disease) 07/05/2006  . GERD 07/05/2006    Past Surgical History:  Procedure Laterality Date  . CARDIAC CATHETERIZATION  09/06/2013   [place]  . CARDIAC CATHETERIZATION    . COLONOSCOPY WITH PROPOFOL N/A 01/21/2019   Procedure: COLONOSCOPY WITH PROPOFOL;  Surgeon: Lucilla Lame, MD;  Location: [place];  Service: Endoscopy;  Laterality: N/A;  sleep apnea  . CORONARY ANGIOPLASTY  2004   STENT PLACEMENT  . CYSTOSCOPY WITH RETROGRADE PYELOGRAM, URETEROSCOPY AND STENT PLACEMENT Left 03/19/2014   Procedure: CYSTOSCOPY WITH RETROGRADE PYELOGRAM, URETEROSCOPY AND STENT PLACEMENT;  Surgeon: Junious Dresser, MD;  Location: [facility];  Service: Urology;  Laterality: Left;  . CYSTOSCOPY WITH RETROGRADE PYELOGRAM, URETEROSCOPY AND STENT PLACEMENT Bilateral 05/20/2015   Procedure:  CYSTOSCOPY WITH BILATERAL RETROGRADE PYELOGRAM,RIGHT  DIAGNOSTIC URETEROSCOPY ,LEFT URETEROSCOPY WITH HOLMIUM LASER  AND BILATERAL  STENT PLACEMENT ;  Surgeon: Alexis Frock, MD;  Location: [facility];  Service: Urology;  Laterality: Bilateral;   . DRUG INDUCED ENDOSCOPY N/A 10/20/2017   Procedure: DRUG INDUCED SLEEP ENDOSCOPY;  Surgeon: Melida Quitter, MD;  Location: [place];  Service: ENT;  Laterality: N/A;  . EP IMPLANTABLE DEVICE N/A 05/13/2016   Procedure: Loop Recorder Insertion;  Surgeon: Deboraha Sprang, MD;  Location: [place] [facility];  Service: Cardiovascular;  Laterality: N/A;  . ESOPHAGOGASTRODUODENOSCOPY (EGD) WITH PROPOFOL N/A 11/30/2018   Procedure: ESOPHAGOGASTRODUODENOSCOPY (EGD) WITH PROPOFOL;  Surgeon: Jonathon Bellows, MD;  Location: [facility];  Service: Gastroenterology;  Laterality: N/A;  . ESOPHAGOGASTRODUODENOSCOPY (EGD) WITH PROPOFOL N/A 12/01/2018   Procedure: ESOPHAGOGASTRODUODENOSCOPY (EGD) WITH PROPOFOL;  Surgeon: Lucilla Lame, MD;  Location: [facility];  Service: Endoscopy;  Laterality: N/A;  . HOLMIUM LASER APPLICATION Bilateral 07/16/6544   Procedure: HOLMIUM LASER APPLICATION;  Surgeon: Alexis Frock, MD;  [facility];  Service: Urology;  Laterality: Bilateral;  . IMPLIMENTATION OF A HYPOGLOSSAL NERVE STIMULATOR  12/08/2017   OSA   . LEFT HEART CATHETERIZATION WITH CORONARY ANGIOGRAM N/A 09/06/2013   Procedure: LEFT HEART CATHETERIZATION WITH CORONARY ANGIOGRAM;  Surgeon: Sinclair Grooms, MD;  Location: [facility];  Service: Cardiovascular;  Laterality: N/A;  . LEFT KNEE CAP  SURGERY ABOUT 40 YRS AGO  ? 1970    . STONE EXTRACTION WITH BASKET Left 03/19/2014   Procedure: STONE EXTRACTION WITH BASKET;  Surgeon: Junious Dresser, MD;  Location[facility];  Service: Urology;  Laterality: Left;    Prior to Admission medications   Medication Sig Start Date End Date Taking? Authorizing Provider  alendronate (FOSAMAX) 70 MG tablet TAKE 1 TABLET BY MOUTH EVERY 7 DAYS WITH A FULL GLASS OF WATER ON AN EMPTY STOMACH. 12/17/19   Bedsole, Amy E, MD  ASPIRIN 81 PO Take by mouth daily.    [provider]  atorvastatin (LIPITOR) 80 MG tablet TAKE 1 TABLET DAILY 04/23/19   Bedsole, Amy E, MD   azithromycin (ZITHROMAX) 250 MG tablet 2 tablets today, then 1 tablet for the next 4 days. 01/04/20   Triplett, Cari B, FNP  busPIRone (BUSPAR) 10 MG tablet TAKE 2 TABLETS (20 MG TOTAL) BY MOUTH 3 (THREE) TIMES DAILY. 12/19/19   Bedsole, Amy E, MD  clopidogrel (PLAVIX) 75 MG tablet TAKE 1 TABLET DAILY 01/23/19   Gollan, Kathlene November, MD  cyclobenzaprine (FLEXERIL) 10 MG tablet TAKE 1/2-1 TAB BY MOUTH AT BEDTIME AS NEEDED FOR MUSCLE SPASMS 01/14/19   Bedsole, Amy E, MD  ferrous sulfate 325 (65 FE) MG tablet TAKE 1 TABLET BY MOUTH 3 TIMES DAILY WITH MEALS. 02/19/19   Bedsole, Amy E, MD  FLUoxetine (PROZAC) 20 MG capsule TAKE 1 CAPSULE DAILY. TAKE WITH 40MG  TO EQUAL 60MG     ONCE DAILY 10/07/19   Diona Browner, Amy E, MD  FLUoxetine (PROZAC) 40 MG capsule Take 1 capsule (40 mg total) by mouth every morning. 10/07/19   Bedsole, Amy E, MD  furosemide (LASIX) 20 MG tablet Take 1 tablet (20 mg total) by mouth as needed (As needed for ankle swelling). 07/16/19   Minna Merritts, MD  gabapentin (NEURONTIN) 100 MG capsule Take 2 capsules (200 mg total) by mouth  2 (two) times daily. 01/01/20   Copland, Frederico Hamman, MD  hydrOXYzine (ATARAX/VISTARIL) 25 MG tablet TAKE 1 TABLET BY MOUTH AT BEDTIME MAY MAKE DROWSY 02/14/18   [provider]  levothyroxine (SYNTHROID) 75 MCG tablet Take 1 tablet (75 mcg total) by mouth daily before breakfast. 04/03/19   Bedsole, Amy E, MD  meloxicam (MOBIC) 15 MG tablet Take 15 mg by mouth daily. 09/10/19   [provider]  midodrine (PROAMATINE) 10 MG tablet TAKE 1 TABLET (10 MG TOTAL) BY MOUTH three TIMES DAILY WITH BREAKFAST  LUNCH dinner 01/14/19   Minna Merritts, MD  mirtazapine (REMERON) 15 MG tablet TAKE 1 TABLET BY MOUTH EVERYDAY AT BEDTIME 09/23/19   Bedsole, Amy E, MD  nitroGLYCERIN (NITROSTAT) 0.4 MG SL tablet Place 1 tablet (0.4 mg total) under the tongue every 5 (five) minutes as needed for chest pain. 03/30/18   Elby Beck, FNP  pantoprazole (PROTONIX) 40 MG tablet  TAKE 1 TABLET BY MOUTH TWICE A DAY 08/21/19   Bedsole, Amy E, MD  predniSONE (STERAPRED UNI-PAK 21 TAB) 10 MG (21) TBPK tablet Take 6 tablets on the first day and decrease by 1 tablet each day until finished. 01/04/20   Triplett, Johnette Abraham B, FNP  tamsulosin (FLOMAX) 0.4 MG CAPS capsule Take 0.4 mg by mouth at bedtime. 02/06/19   [provider]  triamcinolone cream (KENALOG) 0.1 % APPLY 1 APPLICATION TOPICALLY 2 (TWO) TIMES DAILY. AVOID FACE, GROIN, UNDERARMS. 12/20/19   Bedsole, Amy E, MD  vitamin B-12 (CYANOCOBALAMIN) 1000 MCG tablet Take 1,000 mcg by mouth daily.     [provider]  zolpidem (AMBIEN) 5 MG tablet TAKE 1 TABLET BY MOUTH AT BEDTIME AS NEEDED FOR SLEEP 09/09/18   [provider]    Allergies Codeine and Tape  Family History  Problem Relation Age of Onset  . Coronary artery disease Mother   . Heart attack Mother   . Heart disease Mother   . Hypertension Mother   . Cancer Father        lung and colon  . Lung cancer Father        smoker  . Diabetes Brother     Social History Social History   Tobacco Use  . Smoking status: Never Smoker  . Smokeless tobacco: Never Used  Vaping Use  . Vaping Use: Never used  Substance Use Topics  . Alcohol use: Yes    Alcohol/week: 1.0 standard drink    Types: 1 Cans of beer per week    Comment: Occasional beer  . Drug use: No    Review of Systems  Constitutional: + fever, weakness Eyes: Negative for visual changes. ENT: Negative for sore throat. Neck: No neck pain  Cardiovascular: Negative for chest pain. Respiratory: + shortness of breath, cough Gastrointestinal: Negative for abdominal pain, vomiting or diarrhea. + nausea Genitourinary: Negative for dysuria. Musculoskeletal: Negative for back pain. Skin: Negative for rash. Neurological: Negative for headaches, weakness or numbness. Psych: No SI or HI  ____________________________________________   PHYSICAL EXAM:  VITAL SIGNS: ED Triage  Vitals  Enc Vitals Group     BP 01/06/20 0625 110/61     Pulse Rate 01/06/20 0625 100     Resp 01/06/20 0625 (!) 29     Temp 01/06/20 0625 (!) 101.7 F (38.7 C)     Temp Source 01/06/20 0625 Oral     SpO2 01/06/20 0625 (!) 89 %     Weight 01/06/20 0626 165 lb (74.8 kg)  Height 01/06/20 0626 6' (1.829 m)     Head Circumference --      Peak Flow --      Pain Score 01/06/20 0626 8     Pain Loc --      Pain Edu? --      Excl. in [place]? --     Constitutional: Alert and oriented. Well appearing and in no apparent distress. HEENT:      Head: Normocephalic and atraumatic.         Eyes: Conjunctivae are normal. Sclera is non-icteric.       Mouth/Throat: Mucous membranes are moist.       Neck: Supple with no signs of meningismus. Cardiovascular: Regular rate and rhythm. No murmurs, gallops, or rubs. Respiratory: Normal respiratory effort. Hypoxic. Lungs are clear to auscultation bilaterally.  Gastrointestinal: Soft, non tender, and non distended. Musculoskeletal:  No edema, cyanosis, or erythema of extremities. Neurologic: Normal speech and language. Face is symmetric. Moving all extremities. No gross focal neurologic deficits are appreciated. Skin: Skin is warm, dry and intact. No rash noted. Psychiatric: Mood and affect are normal. Speech and behavior are normal.  ____________________________________________   LABS (all labs ordered are listed, but only abnormal results are displayed)  Labs Reviewed  CBC WITH DIFFERENTIAL/PLATELET - Abnormal; Notable for the following components:      Result Value   RBC 4.16 (*)    All other components within normal limits  CULTURE, BLOOD (ROUTINE X 2)  CULTURE, BLOOD (ROUTINE X 2)  RESPIRATORY PANEL BY RT PCR (FLU A&B, COVID)  LACTIC ACID, PLASMA  LACTIC ACID, PLASMA  COMPREHENSIVE METABOLIC PANEL  FIBRIN DERIVATIVES D-DIMER ([facility] ONLY)  PROCALCITONIN  URINALYSIS, COMPLETE (UACMP) WITH MICROSCOPIC  TROPONIN I (HIGH SENSITIVITY)    ____________________________________________  EKG  ED ECG REPORT I, Rudene Re, the attending physician, personally viewed and interpreted this ECG.  NSR, rate 91, prolonged QTc, no STE ____________________________________________  RADIOLOGY  I have personally reviewed the images performed during this visit and I agree with the Radiologist's read.   Interpretation by Radiologist:  DG Chest Portable 1 View  Result Date: 01/06/2020 CLINICAL DATA:  Cough COVID. EXAM: PORTABLE CHEST 1 VIEW COMPARISON:  Two days ago FINDINGS: Normal heart size. Implants over the bilateral chest in stable position. Subtle but convincing hazy density at the left base. No Kerley lines, effusion, or pneumothorax. IMPRESSION: Mild infiltrate seen at the left base. Electronically Signed   By: Monte Fantasia M.D.   On: 01/06/2020 06:50      ____________________________________________   PROCEDURES  Procedure(s) performed:yes .1-3 Lead EKG Interpretation Performed by: Rudene Re, MD Authorized by: Rudene Re, MD     Interpretation: non-specific     ECG rate assessment: normal     Rhythm: sinus rhythm     Ectopy: none     Critical Care performed: yes  CRITICAL CARE Performed by: Rudene Re  ?  Total critical care time: 40 min  Critical care time was exclusive of separately billable procedures and treating other patients.  Critical care was necessary to treat or prevent imminent or life-threatening deterioration.  Critical care was time spent personally by me on the following activities: development of treatment plan with patient and/or surrogate as well as nursing, discussions with consultants, evaluation of patient's response to treatment, examination of patient, obtaining history from patient or surrogate, ordering and performing treatments and interventions, ordering and review of laboratory studies, ordering and review of radiographic studies, pulse oximetry  and re-evaluation of  patient's condition.  ____________________________________________   INITIAL IMPRESSION / ASSESSMENT AND PLAN / ED COURSE  69 y.o. male with a history of CHF with preserved EF, CAD,  OSA on CPAP, hypothyroidism, hyperlipidemia, anemia who presents from home for evaluation of shortness of breath, + COVID 4 days ago.  Patient arrives hypoxic to 89% and tachypneic.  Respiratory status improved on 2 L nasal cannula.  Unfortunately I am unable to find patient's Covid results therefore he will be swabbed again here.  Labs, chest x-ray, and EKG pending.  Will start patient on remdesivir and Solu-Medrol.  D-dimer pending.  If positive will do a CT angio of the chest.  Procalcitonin pending.  If positive will add antibiotics.  Old medical records reviewed.   _________________________ 7:00 AM on 01/06/2020 ----------------------------------------- Labs and imaging pending. Care transferred to Dr. Creig Hines      _____________________________________________ Please note:  Patient was evaluated in Emergency Department today for the symptoms described in the history of present illness. Patient was evaluated in the context of the global COVID-19 pandemic, which necessitated consideration that the patient might be at risk for infection with the SARS-CoV-2 virus that causes COVID-19. Institutional protocols and algorithms that pertain to the evaluation of patients at risk for COVID-19 are in a state of rapid change based on information released by regulatory bodies including the CDC and federal and state organizations. These policies and algorithms were followed during the patient's care in the ED.  Some ED evaluations and interventions may be delayed as a result of limited staffing during the pandemic.   [place] Controlled Substance Database was reviewed by me. ____________________________________________   FINAL CLINICAL IMPRESSION(S) / ED DIAGNOSES   Final diagnoses:  Acute respiratory  failure with hypoxia ([place])  COVID-19      NEW MEDICATIONS STARTED DURING THIS VISIT:  ED Discharge Orders    None       Note:  This document was prepared using Dragon voice recognition software and may include unintentional dictation errors.    Alfred Levins, [place], MD 01/06/20 [place], [place], [place] 01/06/20 [phone]

## 2020-01-06 NOTE — Telephone Encounter (Signed)
What about wife... does she want to get set up for MAB?   [place], [place] - [phone]

## 2020-01-06 NOTE — ED Notes (Signed)
Admitting MD at bedside.

## 2020-01-06 NOTE — Telephone Encounter (Signed)
[facility] - Client TELEPHONE ADVICE RECORD [facility] Patient Name: Shawn Lam Gender: Male DOB: Jun 18, 1950 Age: 69 Y 12 M 12 D Return Phone Number: [phone] (Primary), [phone] (Secondary) Address: City/State/Zip: [place] [address] Client [facility] - Client Client Site [place] - Day Physician [name] - MD Contact Type Call Who Is Calling Patient / Member / Family / Caregiver Call Type Triage / Clinical Caller Name Demonie Kassa Relationship To Patient Spouse Return Phone Number [phone] (Primary) Chief Complaint Cough Reason for Call Symptomatic / Request for [place] states that she would like to see about getting it set up for her husband to receive the monoclonal antibody treatment as she does not seem to be getting better. They both tested positive for covid on thursday, but he seems worse. His cough is worse today, he has body aches, as well as diarrhea. He did get the flu shot on Tuesday as well. [place] Not Listed [facility] No Nurse Assessment Nurse: Ysidro Evert, RN, Levada Dy Date/Time Eilene Ghazi Time): 01/04/2020 10:33:35 AM Confirm and document reason for call. If symptomatic, describe symptoms. ---Caller states he was diagnosed with Corona on Thursday and he is having a cough and congestion and diarrhea. No fever Does the patient have any new or worsening symptoms? ---Yes Will a triage be completed? ---Yes Related visit to physician within the last 2 weeks? ---No Does the PT have any chronic conditions? (i.e. diabetes, asthma, this includes High risk factors for pregnancy, etc.) ---No Is this a behavioral health or substance abuse call? ---No Guidelines Guideline Title Affirmed Question Affirmed Notes Nurse Date/Time ([place] Time) COVID-19 - Diagnosed or Suspected MODERATE difficulty breathing (e.g.,  speaks in phrases, SOB even at rest, pulse 100-120) Ysidro Evert, RN, Levada Dy 01/04/2020 10:35:06 AM Disp. Time Eilene Ghazi Time) Disposition Final User 01/04/2020 10:15:09 AM Attempt made - no message left Ysidro Evert, RN, AngelaPLEASE NOTE: All timestamps contained within this report are represented as [place] Standard Time. CONFIDENTIALTY NOTICE: This fax transmission is intended only for the addressee. It contains information that is legally privileged, confidential or otherwise protected from use or disclosure. If you are not the intended recipient, you are strictly prohibited from reviewing, disclosing, copying using or disseminating any of this information or taking any action in reliance on or regarding this information. If you have received this fax in error, please notify us immediately by telephone so that we can arrange for its return to [place]. Phone: [phone], Toll-Free: [phone], Fax: [phone] Page: 2 of 2 Call Id: [id number] 01/04/2020 10:36:39 AM Go to ED Now Yes Ysidro Evert, RN, [place] Shutter Disagree/Comply Comply Caller Understands Yes PreDisposition Did not know what to do Care Advice Given Per Guideline GO TO ED NOW: * You need to be seen in the Emergency Department. * Go to the ED at ___________ [place] THAT YOU MIGHT HAVE COVID-19: * Tell the first healthcare worker you meet that you may have COVID-19. WEAR A MASK - COVER YOUR MOUTH AND NOSE: * Wear a mask. ANOTHER ADULT SHOULD DRIVE: * It is better and safer if another adult drives instead of you. CARE ADVICE given per COVID-19 - DIAGNOSED OR SUSPECTED (Adult) guideline. CALL EMS 911 IF: * Severe difficulty breathing occurs * Lips or face turns blue * Confusion occurs. Referrals GO TO FACILITY OTHER - SPECIFY

## 2020-01-06 NOTE — Progress Notes (Signed)
Remdesivir - Pharmacy Brief Note   O:  ALT:  CXR:  SpO2: 91 % on RA    A/P:  Remdesivir 200 mg IVPB once followed by 100 mg IVPB daily x 4 days.   [name] D 01/06/2020 6:41 AM

## 2020-01-06 NOTE — ED Notes (Signed)
Date and time results received: 01/06/20 8:13 AM  (use smartphrase ".now" to insert current time)  Test: COVID Critical Value: Positive   Name of Provider Notified: Dr. Blaine Hamper   Orders Received? Or Actions Taken?: No new orders at this time

## 2020-01-06 NOTE — Telephone Encounter (Signed)
This is being documented in the pt's wife's chart.

## 2020-01-06 NOTE — ED Notes (Signed)
Patient given blanket, water, and remote control

## 2020-01-06 NOTE — Telephone Encounter (Signed)
Spoke with Shawn Lam.  He is currently at the ER waiting to be admitted for PNA.

## 2020-01-06 NOTE — Telephone Encounter (Signed)
[name], Please call Mr and Mrs. Weeks...   I see Mr. Moder had an ER visit. At this tie are they still not interested in [facility]?   If they are interested in MAB infusion please give their info to [place], [place] for referral to MAB infusion clinic ASAP.

## 2020-01-06 NOTE — H&P (Signed)
History and Physical    Shawn Lam ZJI:[id number] DOB: 01/06/1951 DOA: 01/06/2020  Referring MD/NP/PA:   PCP: Jinny Sanders, MD   Patient coming from:  The patient is coming from home.  At baseline, pt is independent for most of ADL.        Chief Complaint: Cough, shortness breath, fever, chills  HPI: Shawn Lam is a 69 y.o. male with medical history significant of hyperlipidemia, GERD, hypothyroidism, depression, anxiety, CKD stage III, PVD, OSA, orthostatic hypotension, CAD, Berger's disease, iron deficiency anemia, who presents with cough, shortness breath, fever and chills.  Patient states that he has been sick for almost a week, which has been progressively worsening. He has dry cough, shortness of breath, fever, chills, body aches, generalized weakness, poor appetite. Patient was tested positive for Covid 4 days ago. He was seen by PCP on Tuesday and started on prednisone, and was seen in ED again on 9/25, started on azithromycin. He states that his symptoms has not improved. Patient has nausea, mild diarrhea and mild lower abdominal pain. No vomiting. No symptoms of UTI or unilateral weakness. Patient was fully vaccinated against Covid.   ED Course: pt was found to have positive Covid PCR, WBC 8.8, lactic acid of 1.3, INR 1.3, BNP 46, potassium 3.0, stable renal function, temperature one 1.7, blood pressure 104/59, heart rate 100, RR 29, oxygen saturation 89% on room air initially, which improved to 95% on room air after breathing treatment. Chest x-ray showed left basilar infiltration. Patient is admitted to [facility] bed as inpatient.  Review of Systems:   General: has fevers, chills, no body weight gain, has poor appetite, has fatigue HEENT: no blurry vision, hearing changes or sore throat Respiratory: has dyspnea, coughing, no wheezing CV: no chest pain, no palpitations GI: has nausea,  abdominal pain, diarrhea, no constipation, vomiting, GU: no dysuria, burning on  urination, increased urinary frequency, hematuria  Ext: no leg edema Neuro: no unilateral weakness, numbness, or tingling, no vision change or hearing loss Skin: no rash, no skin tear. MSK: No muscle spasm, no deformity, no limitation of range of movement in spin Heme: No easy bruising.  Travel history: No recent long distant travel.  Allergy:  Allergies  Allergen Reactions  . Codeine Nausea Only  . Tape Other (See Comments)    Use only paper tape. Severe blistering and bruising with other tapes. No cloth tape.    Past Medical History:  Diagnosis Date  . Anemia   . Anxiety   . Asymptomatic LV dysfunction    50-55% by echo 06/2016  . Berger's disease   . CAD (coronary artery disease) 06/2005   PCI of LAD and RCA  . Depression   . Dilated aortic root (HCC)    4cm at sinus of Valsalva  . Hematuria   . Hyperlipidemia   . Hypothyroidism   . Insomnia   . Left ureteral stone   . Orthostatic hypotension    negative tilt table  . OSA (obstructive sleep apnea)    moderate with AHI 17/hr, uses CPAP nightly(01/15/19 - has Inspire implant)  . PVC's (premature ventricular contractions) 07/07/2016  . Renal disorder    PT IS SEEING DR. Lorrene Reid- NEPHROLOGIST  . Rosacea   . Urticaria     Past Surgical History:  Procedure Laterality Date  . CARDIAC CATHETERIZATION  09/06/2013   [place]  . CARDIAC CATHETERIZATION    . COLONOSCOPY WITH PROPOFOL N/A 01/21/2019   Procedure: COLONOSCOPY WITH PROPOFOL;  Surgeon: Allen Norris,  Darren, MD;  Location: [place];  Service: Endoscopy;  Laterality: N/A;  sleep apnea  . CORONARY ANGIOPLASTY  2004   STENT PLACEMENT  . CYSTOSCOPY WITH RETROGRADE PYELOGRAM, URETEROSCOPY AND STENT PLACEMENT Left 03/19/2014   Procedure: CYSTOSCOPY WITH RETROGRADE PYELOGRAM, URETEROSCOPY AND STENT PLACEMENT;  Surgeon: Junious Dresser, MD;  Location: [facility];  Service: Urology;  Laterality: Left;  . CYSTOSCOPY WITH RETROGRADE PYELOGRAM, URETEROSCOPY AND STENT PLACEMENT  Bilateral 05/20/2015   Procedure:  CYSTOSCOPY WITH BILATERAL RETROGRADE PYELOGRAM,RIGHT  DIAGNOSTIC URETEROSCOPY ,LEFT URETEROSCOPY WITH HOLMIUM LASER  AND BILATERAL  STENT PLACEMENT ;  Surgeon: Alexis Frock, MD;  Location: [facility];  Service: Urology;  Laterality: Bilateral;  . DRUG INDUCED ENDOSCOPY N/A 10/20/2017   Procedure: DRUG INDUCED SLEEP ENDOSCOPY;  Surgeon: Melida Quitter, MD;  Location: [place];  Service: ENT;  Laterality: N/A;  . EP IMPLANTABLE DEVICE N/A 05/13/2016   Procedure: Loop Recorder Insertion;  Surgeon: Deboraha Sprang, MD;  Location: [facility];  Service: Cardiovascular;  Laterality: N/A;  . ESOPHAGOGASTRODUODENOSCOPY (EGD) WITH PROPOFOL N/A 11/30/2018   Procedure: ESOPHAGOGASTRODUODENOSCOPY (EGD) WITH PROPOFOL;  Surgeon: Jonathon Bellows, MD;  Location[facility];  Service: Gastroenterology;  Laterality: N/A;  . ESOPHAGOGASTRODUODENOSCOPY (EGD) WITH PROPOFOL N/A 12/01/2018   Procedure: ESOPHAGOGASTRODUODENOSCOPY (EGD) WITH PROPOFOL;  Surgeon: Lucilla Lame, MD;  Location: [facility] END[facility]COPY;  Service: Endoscopy;  Laterality: N/A;  . HOLMIUM LASER APPLICATION Bilateral 05/17/7122   Procedure: HOLMIUM LASER APPLICATION;  Surgeon: Alexis Frock, MD;  Location: [facility];  Service: Urology;  Laterality: Bilateral;  . IMPLIMENTATION OF A HYPOGLOSSAL NERVE STIMULATOR  12/08/2017   [facility]   . LEFT HEART CATHETERIZATION WITH CORONARY ANGIOGRAM N/A 09/06/2013   Procedure: LEFT HEART CATHETERIZATION WITH CORONARY ANGIOGRAM;  Surgeon: Sinclair Grooms, MD;  Location: [facility] C[facility] LAB;  Service: Cardiovascular;  Laterality: N/A;  . LEFT KNEE CAP  SURGERY ABOUT 40 YRS AGO  ? 1970    . STONE EXTRACTION WITH BASKET Left 03/19/2014   Procedure: STONE EXTRACTION WITH BASKET;  Surgeon: Junious Dresser, MD;  Location: [facility];  Service: Urology;  Laterality: Left;    Social History:  reports that he has never smoked. He has never used smokeless tobacco. He reports current alcohol use of  about 1.0 standard drink of alcohol per week. He reports that he does not use drugs.  Family History:  Family History  Problem Relation Age of Onset  . Coronary artery disease Mother   . Heart attack Mother   . Heart disease Mother   . Hypertension Mother   . Cancer Father        lung and colon  . Lung cancer Father        smoker  . Diabetes Brother      Prior to Admission medications   Medication Sig Start Date End Date Taking? Authorizing Provider  alendronate (FOSAMAX) 70 MG tablet TAKE 1 TABLET BY MOUTH EVERY 7 DAYS WITH A FULL GLASS OF WATER ON AN EMPTY STOMACH. 12/17/19   Bedsole, Amy E, MD  ASPIRIN 81 PO Take by mouth daily.    [provider]  atorvastatin (LIPITOR) 80 MG tablet TAKE 1 TABLET DAILY 04/23/19   Bedsole, Amy E, MD  azithromycin (ZITHROMAX) 250 MG tablet 2 tablets today, then 1 tablet for the next 4 days. 01/04/20   Triplett, Cari B, FNP  busPIRone (BUSPAR) 10 MG tablet TAKE 2 TABLETS (20 MG TOTAL) BY MOUTH 3 (THREE) TIMES DAILY. 12/19/19  Bedsole, Amy E, MD  clopidogrel (PLAVIX) 75 MG tablet TAKE 1 TABLET DAILY 01/23/19   Gollan, Kathlene November, MD  cyclobenzaprine (FLEXERIL) 10 MG tablet TAKE 1/2-1 TAB BY MOUTH AT BEDTIME AS NEEDED FOR MUSCLE SPASMS 01/14/19   Bedsole, Amy E, MD  ferrous sulfate 325 (65 FE) MG tablet TAKE 1 TABLET BY MOUTH 3 TIMES DAILY WITH MEALS. 02/19/19   Bedsole, Amy E, MD  FLUoxetine (PROZAC) 20 MG capsule TAKE 1 CAPSULE DAILY. TAKE WITH 40MG  TO EQUAL 60MG     ONCE DAILY 10/07/19   Jinny Sanders, MD  FLUoxetine (PROZAC) 40 MG capsule Take 1 capsule (40 mg total) by mouth every morning. 10/07/19   Bedsole, Amy E, MD  furosemide (LASIX) 20 MG tablet Take 1 tablet (20 mg total) by mouth as needed (As needed for ankle swelling). 07/16/19   Minna Merritts, MD  gabapentin (NEURONTIN) 100 MG capsule Take 2 capsules (200 mg total) by mouth 2 (two) times daily. 01/01/20   Copland, Frederico Hamman, MD  hydrOXYzine (ATARAX/VISTARIL) 25 MG tablet TAKE 1 TABLET BY  MOUTH AT BEDTIME MAY MAKE DROWSY 02/14/18   [provider]  levothyroxine (SYNTHROID) 75 MCG tablet Take 1 tablet (75 mcg total) by mouth daily before breakfast. 04/03/19   Bedsole, Amy E, MD  meloxicam (MOBIC) 15 MG tablet Take 15 mg by mouth daily. 09/10/19   [provider]  midodrine (PROAMATINE) 10 MG tablet TAKE 1 TABLET (10 MG TOTAL) BY MOUTH three TIMES DAILY WITH BREAKFAST  LUNCH dinner 01/14/19   Minna Merritts, MD  mirtazapine (REMERON) 15 MG tablet TAKE 1 TABLET BY MOUTH EVERYDAY AT BEDTIME 09/23/19   Bedsole, Amy E, MD  nitroGLYCERIN (NITROSTAT) 0.4 MG SL tablet Place 1 tablet (0.4 mg total) under the tongue every 5 (five) minutes as needed for chest pain. 03/30/18   Elby Beck, FNP  pantoprazole (PROTONIX) 40 MG tablet TAKE 1 TABLET BY MOUTH TWICE A DAY 08/21/19   Bedsole, Amy E, MD  predniSONE (STERAPRED UNI-PAK 21 TAB) 10 MG (21) TBPK tablet Take 6 tablets on the first day and decrease by 1 tablet each day until finished. 01/04/20   Triplett, Johnette Abraham B, FNP  tamsulosin (FLOMAX) 0.4 MG CAPS capsule Take 0.4 mg by mouth at bedtime. 02/06/19   [provider]  triamcinolone cream (KENALOG) 0.1 % APPLY 1 APPLICATION TOPICALLY 2 (TWO) TIMES DAILY. AVOID FACE, GROIN, UNDERARMS. 12/20/19   Bedsole, Amy E, MD  vitamin B-12 (CYANOCOBALAMIN) 1000 MCG tablet Take 1,000 mcg by mouth daily.     [provider]  zolpidem (AMBIEN) 5 MG tablet TAKE 1 TABLET BY MOUTH AT BEDTIME AS NEEDED FOR SLEEP 09/09/18   [provider]    Physical Exam: Vitals:   01/06/20 1000 01/06/20 1030 01/06/20 1100 01/06/20 1130  BP: (!) 99/54 (!) 110/53 99/64 105/64  Pulse: 68 70 67 66  Resp: (!) 24 (!) 26 15 (!) 25  Temp:      TempSrc:      SpO2: 95% 95% 96% 96%  Weight:      Height:       General: Not in acute distress HEENT:       Eyes: PERRL, EOMI, no scleral icterus.       ENT: No discharge from the ears and nose, no pharynx injection, no tonsillar enlargement.         Neck: No JVD, no bruit, no mass felt. Heme: No neck lymph node enlargement. Cardiac: S1/S2, RRR, No murmurs,  No gallops or rubs. Respiratory: Good air movement bilaterally. No rales, wheezing, rhonchi or rubs. GI: Soft, nondistended, nontender, no rebound pain, no organomegaly, BS present. GU: No hematuria Ext: No pitting leg edema bilaterally. 2+DP/PT pulse bilaterally. Musculoskeletal: No joint deformities, No joint redness or warmth, no limitation of ROM in spin. Skin: No rashes.  Neuro: Alert, oriented X3, cranial nerves II-XII grossly intact, moves all extremities normally. Muscle strength 5/5 in all extremities, sensation to light touch intact. Brachial reflex 2+ bilaterally. Knee reflex 1+ bilaterally. Negative Babinski's sign. Normal finger to nose test. Psych: Patient is not psychotic, no suicidal or hemocidal ideation.  Labs on Admission: I have personally reviewed following labs and imaging studies  CBC: Recent Labs  Lab 01/04/20 1431 01/06/20 0631  WBC 8.4 8.8  NEUTROABS  --  7.5  HGB 14.1 13.6  HCT 41.0 40.0  MCV 94.5 96.2  PLT 197 878   Basic Metabolic Panel: Recent Labs  Lab 01/04/20 1431 01/06/20 0631  NA 136 139  K 3.7 3.0*  CL 103 105  CO2 21* 23  GLUCOSE 131* 93  BUN 18 23  CREATININE 1.41* 1.45*  CALCIUM 8.7* 8.5*   GFR: Estimated Creatinine Clearance: 50.9 mL/min (A) (by C-G formula based on SCr of 1.45 mg/dL (H)). Liver Function Tests: Recent Labs  Lab 01/06/20 0631  AST 50*  ALT 32  ALKPHOS 50  BILITOT 0.8  PROT 6.3*  ALBUMIN 3.3*   No results for input(s): LIPASE, AMYLASE in the last 168 hours. No results for input(s): AMMONIA in the last 168 hours. Coagulation Profile: No results for input(s): INR, PROTIME in the last 168 hours. Cardiac Enzymes: No results for input(s): CKTOTAL, CKMB, CKMBINDEX, TROPONINI in the last 168 hours. BNP (last 3 results) No results for input(s): PROBNP in the last 8760 hours. HbA1C: No results  for input(s): HGBA1C in the last 72 hours. CBG: No results for input(s): GLUCAP in the last 168 hours. Lipid Profile: Recent Labs    01/06/20 0631  TRIG 43   Thyroid Function Tests: No results for input(s): TSH, T4TOTAL, FREET4, T3FREE, THYROIDAB in the last 72 hours. Anemia Panel: Recent Labs    01/06/20 1010  FERRITIN 414*   Urine analysis:    Component Value Date/Time   COLORURINE YELLOW (A) 05/15/2015 0257   APPEARANCEUR HAZY (A) 05/15/2015 0257   LABSPEC 1.014 05/15/2015 0257   PHURINE 5.0 05/15/2015 0257   GLUCOSEU NEGATIVE 05/15/2015 0257   GLUCOSEU NEGATIVE 01/31/2013 1254   HGBUR 3+ (A) 05/15/2015 0257   HGBUR large 01/21/2010 1102   BILIRUBINUR negative 06/07/2016 1048   KETONESUR NEGATIVE 05/15/2015 0257   PROTEINUR negative 06/07/2016 1048   PROTEINUR NEGATIVE 05/15/2015 0257   UROBILINOGEN 0.2 06/07/2016 1048   UROBILINOGEN 0.2 03/16/2014 1426   NITRITE negative 06/07/2016 1048   NITRITE NEGATIVE 05/15/2015 0257   LEUKOCYTESUR Negative 06/07/2016 1048   Sepsis Labs: @LABRCNTIP (procalcitonin:4,lacticidven:4) ) Recent Results (from the past 240 hour(s))  Respiratory Panel by RT PCR (Flu A&B, Covid) - Nasopharyngeal Swab     Status: Abnormal   Collection Time: 01/06/20  6:31 AM   Specimen: Nasopharyngeal Swab  Result Value Ref Range Status   SARS Coronavirus 2 by RT PCR POSITIVE (A) NEGATIVE Final    Comment: RESULT CALLED TO, READ BACK BY AND VERIFIED WITH: Rex Kras AT 6767 ON 01/06/2020 [place]. (NOTE) SARS-CoV-2 target nucleic acids are DETECTED.  SARS-CoV-2 RNA is generally detectable in upper respiratory specimens  during the acute phase of infection. Positive  results are indicative of the presence of the identified virus, but do not rule out bacterial infection or co-infection with other pathogens not detected by the test. Clinical correlation with patient history and other diagnostic information is necessary to determine patient infection  status. The expected result is Negative.  Fact Sheet for Patients:  PinkCheek.be  Fact Sheet for Healthcare Providers: GravelBags.it  This test is not yet approved or cleared by the [place] FDA and  has been authorized for detection and/or diagnosis of SARS-CoV-2 by FDA under an Emergency Use Authorization (EUA).  This EUA will remain in effect (meaning this test c an be used) for the duration of  the COVID-19 declaration under Section 564(b)(1) of the Act, 21 U.S.C. section 360bbb-3(b)(1), unless the authorization is terminated or revoked sooner.      Influenza A by PCR NEGATIVE NEGATIVE Final   Influenza B by PCR NEGATIVE NEGATIVE Final    Comment: (NOTE) The Xpert Xpress SARS-CoV-2/FLU/RSV assay is intended as an aid in  the diagnosis of influenza from Nasopharyngeal swab specimens and  should not be used as a sole basis for treatment. Nasal washings and  aspirates are unacceptable for Xpert Xpress SARS-CoV-2/FLU/RSV  testing.  Fact Sheet for Patients: PinkCheek.be  Fact Sheet for Healthcare Providers: GravelBags.it  This test is not yet approved or cleared by the [place] FDA and  has been authorized for detection and/or diagnosis of SARS-CoV-2 by  FDA under an Emergency Use Authorization (EUA). This EUA will remain  in effect (meaning this test can be used) for the duration of the  Covid-19 declaration under Section 564(b)(1) of the Act, 21  U.S.C. section 360bbb-3(b)(1), unless the authorization is  terminated or revoked. Performed at [facility], [address] [place], [place] [address]      Radiological Exams on Admission: DG Chest 2 View  Result Date: 01/04/2020 CLINICAL DATA:  Weakness.  COVID positive.  Cough. EXAM: CHEST - 2 VIEW COMPARISON:  Radiograph 11/30/2018 FINDINGS: Implanted loop recorder projects over the left  chest wall. Nerve stimulator with battery pack projecting over the right chest, lead coursing into the neck unchanged. Heart is normal in size with unchanged mediastinal contours. Vague opacities at the left lung base and right midlung zone. Mild biapical pleuroparenchymal scarring. No pneumothorax or pneumomediastinum. No significant pleural effusion. Vertebral augmentation within lower thoracic vertebra. Chronic midthoracic compression fractures. No acute osseous abnormalities are seen. IMPRESSION: Vague opacities at the left lung base and right midlung zone, suspicious for pneumonia in the setting of COVID-19. Electronically Signed   By: Keith Rake M.D.   On: 01/04/2020 15:24   DG Chest Portable 1 View  Result Date: 01/06/2020 CLINICAL DATA:  Cough COVID. EXAM: PORTABLE CHEST 1 VIEW COMPARISON:  Two days ago FINDINGS: Normal heart size. Implants over the bilateral chest in stable position. Subtle but convincing hazy density at the left base. No Kerley lines, effusion, or pneumothorax. IMPRESSION: Mild infiltrate seen at the left base. Electronically Signed   By: Monte Fantasia M.D.   On: 01/06/2020 06:50     EKG: Independently reviewed. Sinus rhythm, QTC 569, early R wave progression, nonspecific T wave change.  Assessment/Plan Principal Problem:   Pneumonia due to COVID-19 virus Active Problems:   HLD (hyperlipidemia)   CAD (coronary artery disease)   Hypothyroidism   CKD (chronic kidney disease), stage IIIa   Sepsis (HCC)   Acute respiratory failure with hypoxia (HCC)   Hypokalemia   Iron deficiency anemia  Acute respiratory failure with hypoxia 2/2 pneumonia due to COVID-19 virus: Oxygen desaturations to 89% in ED, chest x-ray showed left basilar infiltration.  -will admit to med-surg bed as inpt -Remdesivir per pharm -Baricitinib -Solumedrol 40 mg bid -vitamin C, zinc.  -Bronchodilators -PRN Mucinex for cough -f/u Blood culture -Gentle IV fluid:  -D-dimer,  BNP,Trop, LFT, CRP, LDH, Procalcitonin, Ferritin, fibinogen, TG, Hep B SAg, HIV ab -Daily CRP, Ferritin, D-dimer, -Will ask the patient to maintain an awake prone position for 16+ hours a day, if possible, with a minimum of 2-3 hours at a time -Will attempt to maintain euvolemia to a net negative fluid status -IF patient deteriorates, will consult PCCM and ID  Sepsis due to COVID-19 infection: Patient meets criteria for sepsis with tachycardia with heart rate of 100, tachypnea with RR 29.  Lactic acid is normal.  Currently hemodynamically stable. -Will not give IV fluid due to normal lactic acid and COVID-19 pneumonia  HLD (hyperlipidemia) -Lipitor  CAD (coronary artery disease): No chest pain -Aspirin, Lipitor  Hypothyroidism -Synthroid  CKD (chronic kidney disease), stage IIIa: Stable -Follow-up with BMP  Hypokalemia: Potassium 3.0 -Repleted potassium -Check magnesium level  Iron deficiency anemia: Hemoglobin 13.6 -Patient is on iron supplement         DVT ppx:  SQ Lovenox Code Status: Full code Family Communication:    Yes, patient's wife by phone Disposition Plan:  Anticipate discharge back to previous environment Consults called:  nopne Admission status: Med-surg bed as inpt      Status is: Inpatient  Remains inpatient appropriate because:Inpatient level of care appropriate due to severity of illness. Patient has multiple comorbidities, now presents with acute respiratory failure with hypoxia due to COVID-19 pneumonia. Patient also has sepsis due to COVID-19 infection and hypokalemia.. His presentation is highly complicated. Patient will need to be treated in the hospital for at least 2 days.   Dispo: The patient is from: Home              Anticipated d/c is to: Home              Anticipated d/c date is: 2 days              Patient currently is not medically stable to d/c.         Date of Service 01/06/2020    Ivor Costa [facility]   If  7PM-7AM, please contact night-coverage [email or web address] 01/06/2020, 12:31 PM

## 2020-01-07 LAB — CBC WITH DIFFERENTIAL/PLATELET
Abs Immature Granulocytes: 0.05 10*3/uL (ref 0.00–0.07)
Basophils Absolute: 0 10*3/uL (ref 0.0–0.1)
Basophils Relative: 0 %
Eosinophils Absolute: 0 10*3/uL (ref 0.0–0.5)
Eosinophils Relative: 0 %
HCT: 42.4 % (ref 39.0–52.0)
Hemoglobin: 14.1 g/dL (ref 13.0–17.0)
Immature Granulocytes: 0 %
Lymphocytes Relative: 12 %
Lymphs Abs: 1.4 10*3/uL (ref 0.7–4.0)
MCH: 32.5 pg (ref 26.0–34.0)
MCHC: 33.3 g/dL (ref 30.0–36.0)
MCV: 97.7 fL (ref 80.0–100.0)
Monocytes Absolute: 0.5 10*3/uL (ref 0.1–1.0)
Monocytes Relative: 4 %
Neutro Abs: 10 10*3/uL — ABNORMAL HIGH (ref 1.7–7.7)
Neutrophils Relative %: 84 %
Platelets: 186 10*3/uL (ref 150–400)
RBC: 4.34 MIL/uL (ref 4.22–5.81)
RDW: 13.7 % (ref 11.5–15.5)
WBC: 12 10*3/uL — ABNORMAL HIGH (ref 4.0–10.5)
nRBC: 0 % (ref 0.0–0.2)

## 2020-01-07 LAB — COMPREHENSIVE METABOLIC PANEL
ALT: 32 U/L (ref 0–44)
AST: 53 U/L — ABNORMAL HIGH (ref 15–41)
Albumin: 3.2 g/dL — ABNORMAL LOW (ref 3.5–5.0)
Alkaline Phosphatase: 52 U/L (ref 38–126)
Anion gap: 9 (ref 5–15)
BUN: 28 mg/dL — ABNORMAL HIGH (ref 8–23)
CO2: 26 mmol/L (ref 22–32)
Calcium: 9.1 mg/dL (ref 8.9–10.3)
Chloride: 103 mmol/L (ref 98–111)
Creatinine, Ser: 1.44 mg/dL — ABNORMAL HIGH (ref 0.61–1.24)
GFR calc Af Amer: 57 mL/min — ABNORMAL LOW (ref >60)
GFR calc non Af Amer: 49 mL/min — ABNORMAL LOW (ref >60)
Glucose, Bld: 96 mg/dL (ref 70–99)
Potassium: 5.2 mmol/L — ABNORMAL HIGH (ref 3.5–5.1)
Sodium: 138 mmol/L (ref 135–145)
Total Bilirubin: 0.8 mg/dL (ref 0.3–1.2)
Total Protein: 6.5 g/dL (ref 6.5–8.1)

## 2020-01-07 LAB — FERRITIN: Ferritin: 504 ng/mL — ABNORMAL HIGH (ref 24–336)

## 2020-01-07 LAB — MAGNESIUM: Magnesium: 2.1 mg/dL (ref 1.7–2.4)

## 2020-01-07 LAB — C-REACTIVE PROTEIN: CRP: 7.6 mg/dL — ABNORMAL HIGH (ref <1.0)

## 2020-01-07 LAB — FIBRIN DERIVATIVES D-DIMER (ARMC ONLY): Fibrin derivatives D-dimer (ARMC): 674.65 ng/mL (FEU) — ABNORMAL HIGH (ref 0.00–499.00)

## 2020-01-07 NOTE — Progress Notes (Signed)
Patient alert and oriented, MEW yellow due to RR, has been previously yellow.Patient on RA,  no complaints of pain, independent in room, will continue to monitor.

## 2020-01-07 NOTE — Plan of Care (Signed)
  Problem: Education: Goal: Knowledge of risk factors and measures for prevention of condition will improve Outcome: Progressing   Problem: Coping: Goal: Psychosocial and spiritual needs will be supported Outcome: Progressing   Problem: Respiratory: Goal: Will maintain a patent airway Outcome: Progressing Goal: Complications related to the disease process, condition or treatment will be avoided or minimized Outcome: Progressing   

## 2020-01-07 NOTE — Progress Notes (Signed)
[place] [facility] [place] NAME: Shawn Lam    MR#:  [id number]  DATE OF BIRTH:  1950-08-24  SUBJECTIVE:   Came in with increasing shortness of breath cough malaise. Feels little better. Placed on 2 L of oxygen. Sats 98%. Ate good breakfast. REVIEW OF SYSTEMS:   Review of Systems  Constitutional: Positive for malaise/fatigue. Negative for chills, fever and weight loss.  HENT: Negative for ear discharge, ear pain and nosebleeds.   Eyes: Negative for blurred vision, pain and discharge.  Respiratory: Positive for cough and shortness of breath. Negative for sputum production, wheezing and stridor.   Cardiovascular: Negative for chest pain, palpitations, orthopnea and PND.  Gastrointestinal: Negative for abdominal pain, diarrhea, nausea and vomiting.  Genitourinary: Negative for frequency and urgency.  Musculoskeletal: Negative for back pain and joint pain.  Neurological: Positive for weakness. Negative for sensory change, speech change and focal weakness.  Psychiatric/Behavioral: Negative for depression and hallucinations. The patient is not nervous/anxious.    Tolerating Diet:yes Tolerating PT: not needed  DRUG ALLERGIES:   Allergies  Allergen Reactions  . Codeine Nausea Only  . Tape Other (See Comments)    Use only paper tape. Severe blistering and bruising with other tapes. No cloth tape.    VITALS:  Blood pressure 93/61, pulse 84, temperature 98.6 F (37 C), temperature source Oral, resp. rate 17, height 6' (1.829 m), weight 76.7 kg, SpO2 94 %.  PHYSICAL EXAMINATION:   Physical Exam  GENERAL:  69 y.o.-year-old patient lying in the bed with no acute distress.  EYES: Pupils equal, round, reactive to light and accommodation. No scleral icterus.   HEENT: Head atraumatic, normocephalic. Oropharynx and nasopharynx clear.  NECK:  Supple, no jugular venous distention. No thyroid enlargement, no tenderness.  LUNGS: Normal breath sounds  bilaterally, no wheezing, rales, rhonchi. No use of accessory muscles of respiration.  CARDIOVASCULAR: S1, S2 normal. No murmurs, rubs, or gallops.  ABDOMEN: Soft, nontender, nondistended. Bowel sounds present. No organomegaly or mass.  EXTREMITIES: No cyanosis, clubbing or edema b/l.    NEUROLOGIC: Cranial nerves II through XII are intact. No focal Motor or sensory deficits b/l.   PSYCHIATRIC:  patient is alert and oriented x 3.  SKIN: No obvious rash, lesion, or ulcer.   LABORATORY PANEL:  CBC Recent Labs  Lab 01/07/20 0520  WBC 12.0*  HGB 14.1  HCT 42.4  PLT 186    Chemistries  Recent Labs  Lab 01/07/20 0520  NA 138  K 5.2*  CL 103  CO2 26  GLUCOSE 96  BUN 28*  CREATININE 1.44*  CALCIUM 9.1  MG 2.1  AST 53*  ALT 32  ALKPHOS 52  BILITOT 0.8   Cardiac Enzymes No results for input(s): TROPONINI in the last 168 hours. RADIOLOGY:  DG Chest Portable 1 View  Result Date: 01/06/2020 CLINICAL DATA:  Cough COVID. EXAM: PORTABLE CHEST 1 VIEW COMPARISON:  Two days ago FINDINGS: Normal heart size. Implants over the bilateral chest in stable position. Subtle but convincing hazy density at the left base. No Kerley lines, effusion, or pneumothorax. IMPRESSION: Mild infiltrate seen at the left base. Electronically Signed   By: Monte Fantasia M.D.   On: 01/06/2020 06:50   ASSESSMENT AND PLAN:  Shawn Lam is a 69 y.o. male with medical history significant of hyperlipidemia, GERD, hypothyroidism, depression, anxiety, CKD stage III, PVD, OSA, orthostatic hypotension, CAD, Berger's disease, iron deficiency anemia, who presents with cough, shortness breath, fever and  chills.Patient was tested positive for Covid 4 days ago  Acute respiratory failure with hypoxia 2/2 pneumonia due to COVID-19 virus: Oxygen desaturations to 89% in ED, chest x-ray showed left basilar infiltration. -Remdesivir per pharm -Solumedrol 40 mg bid -vitamin C, zinc.  -Bronchodilators -PRN Mucinex for  cough -Daily CRP 3.2--7.2 - Ferritin 414--504 -D dimer--trending down -sats 98% on 2 L. Wean to room air to keep sats greater than 90%  Sepsis due to COVID-19 infection: Patient meets criteria for sepsis with tachycardia with heart rate of 100, tachypnea with RR 29.  Lactic acid is normal.  Currently hemodynamically stable. -Will not give IV fluid due to normal lactic acid and COVID-19 pneumonia -sepsis resolved  HLD (hyperlipidemia) -Lipitor  CAD (coronary artery disease): No chest pain -Aspirin, Lipitor  Hypothyroidism -Synthroid  CKD (chronic kidney disease), stage IIIa: Stable  Hypokalemia: Potassium 3.0 -Repleted potassium - magnesium level--2.1  Iron deficiency anemia: Hemoglobin 13.6 -Patient is on iron supplement   DVT ppx:  SQ Lovenox Code Status: Full code Family Communication:    Yes, patient's wife by phone today Disposition Plan:  Anticipate discharge back to previous environment Consults called:  none Admission status: Med-surg bed as inpt      Status is: Inpatient  Remains inpatient appropriate because:Inpatient level of care appropriate due to severity of illness. Patient has multiple comorbidities, now presents with acute respiratory failure with hypoxia due to COVID-19 pneumonia. Patient also has sepsis due to COVID-19 infection and hypokalemia.. His presentation is highly complicated. Patient will need to be treated in the hospital for at least 2 days.   Dispo: The patient is from: Home  Anticipated d/c is to: Home  Anticipated d/c date is: 2 days  Patient currently is not medically stable to d/c.  If patient remains medically stable he is agreeable to get remaining infusions of antiviral at [facility].        TOTAL TIME TAKING CARE OF THIS PATIENT: *25* minutes.  >50% time spent on counselling and coordination of care  Note: This dictation was prepared with Dragon dictation along with smaller  phrase technology. Any transcriptional errors that result from this process are unintentional.  Fritzi Mandes M.D    [facility]   CC: Primary care physician; Jinny Sanders, MDPatient ID: Shawn Lam, male   DOB: 1950-10-31, 69 y.o.   MRN: [id number]

## 2020-01-08 ENCOUNTER — Telehealth: Payer: Self-pay

## 2020-01-08 LAB — CBC WITH DIFFERENTIAL/PLATELET
Abs Immature Granulocytes: 0.03 10*3/uL (ref 0.00–0.07)
Basophils Absolute: 0 10*3/uL (ref 0.0–0.1)
Basophils Relative: 0 %
Eosinophils Absolute: 0 10*3/uL (ref 0.0–0.5)
Eosinophils Relative: 0 %
HCT: 42 % (ref 39.0–52.0)
Hemoglobin: 14.4 g/dL (ref 13.0–17.0)
Immature Granulocytes: 0 %
Lymphocytes Relative: 11 %
Lymphs Abs: 1 10*3/uL (ref 0.7–4.0)
MCH: 32 pg (ref 26.0–34.0)
MCHC: 34.3 g/dL (ref 30.0–36.0)
MCV: 93.3 fL (ref 80.0–100.0)
Monocytes Absolute: 0.5 10*3/uL (ref 0.1–1.0)
Monocytes Relative: 6 %
Neutro Abs: 7.3 10*3/uL (ref 1.7–7.7)
Neutrophils Relative %: 83 %
Platelets: 204 10*3/uL (ref 150–400)
RBC: 4.5 MIL/uL (ref 4.22–5.81)
RDW: 13.5 % (ref 11.5–15.5)
WBC: 8.8 10*3/uL (ref 4.0–10.5)
nRBC: 0 % (ref 0.0–0.2)

## 2020-01-08 LAB — MAGNESIUM: Magnesium: 2.1 mg/dL (ref 1.7–2.4)

## 2020-01-08 LAB — COMPREHENSIVE METABOLIC PANEL
ALT: 32 U/L (ref 0–44)
AST: 50 U/L — ABNORMAL HIGH (ref 15–41)
Albumin: 3.1 g/dL — ABNORMAL LOW (ref 3.5–5.0)
Alkaline Phosphatase: 52 U/L (ref 38–126)
Anion gap: 11 (ref 5–15)
BUN: 34 mg/dL — ABNORMAL HIGH (ref 8–23)
CO2: 28 mmol/L (ref 22–32)
Calcium: 9.2 mg/dL (ref 8.9–10.3)
Chloride: 101 mmol/L (ref 98–111)
Creatinine, Ser: 1.39 mg/dL — ABNORMAL HIGH (ref 0.61–1.24)
GFR calc Af Amer: 60 mL/min — ABNORMAL LOW (ref >60)
GFR calc non Af Amer: 51 mL/min — ABNORMAL LOW (ref >60)
Glucose, Bld: 110 mg/dL — ABNORMAL HIGH (ref 70–99)
Potassium: 5.2 mmol/L — ABNORMAL HIGH (ref 3.5–5.1)
Sodium: 140 mmol/L (ref 135–145)
Total Bilirubin: 0.8 mg/dL (ref 0.3–1.2)
Total Protein: 6.5 g/dL (ref 6.5–8.1)

## 2020-01-08 LAB — FIBRIN DERIVATIVES D-DIMER (ARMC ONLY): Fibrin derivatives D-dimer (ARMC): 543.4 ng/mL (FEU) — ABNORMAL HIGH (ref 0.00–499.00)

## 2020-01-08 LAB — C-REACTIVE PROTEIN: CRP: 5.6 mg/dL — ABNORMAL HIGH (ref <1.0)

## 2020-01-08 LAB — FERRITIN: Ferritin: 621 ng/mL — ABNORMAL HIGH (ref 24–336)

## 2020-01-08 MED ORDER — ZINC SULFATE 220 (50 ZN) MG PO CAPS: 220.0000 mg | ORAL_CAPSULE | Freq: Every day | ORAL | 0 refills | Status: AC

## 2020-01-08 MED ORDER — PREDNISONE 10 MG PO TABS: ORAL_TABLET | ORAL | 0 refills | Status: DC

## 2020-01-08 MED ORDER — ASCORBIC ACID 500 MG PO TABS: 500.0000 mg | ORAL_TABLET | Freq: Every day | ORAL | 0 refills | Status: AC

## 2020-01-08 MED ORDER — ALBUTEROL SULFATE HFA 108 (90 BASE) MCG/ACT IN AERS: 2.0000 | INHALATION_SPRAY | RESPIRATORY_TRACT | 0 refills | Status: DC | PRN

## 2020-01-08 NOTE — Progress Notes (Signed)
Pt being discharged home, discharge instructions reviewed with pt, states understanding, pt with no complaints, awaiting home o2 delivery

## 2020-01-08 NOTE — Progress Notes (Signed)
[facility] Summary Report / Loop Recorder 

## 2020-01-08 NOTE — Telephone Encounter (Signed)
Transition Care Management Follow-up Telephone Call  Date of discharge and from where: 01/08/2020, [facility]  How have you been since you were released from the hospital? Patient states that he is doing better but he is still experiencing some shortness of breath. Currently on oxygen and steroid therapy.   Any questions or concerns? No  Items Reviewed:  Did the pt receive and understand the discharge instructions provided? Yes   Medications obtained and verified? Yes   Any new allergies since your discharge? No   Dietary orders reviewed? Yes  Do you have support at home? Yes   Functional Questionnaire: (I = Independent and D = Dependent) ADLs: I  Bathing/Dressing- I  Meal Prep- I  Eating- I  Maintaining continence- I  Transferring/Ambulation- I  Managing Meds- I  Follow up appointments reviewed:   PCP Hospital f/u appt confirmed? Yes  Scheduled to see Dr. Diona Browner on 01/23/2020 @ 2:40 pm.  [facility] f/u appt confirmed? Yes  will schedule to see cardiology   Are transportation arrangements needed? No   If their condition worsens, is the pt aware to call PCP or go to the Emergency Dept.? Yes  Was the patient provided with contact information for the PCP's office or ED? Yes  Was to pt encouraged to call back with questions or concerns? Yes

## 2020-01-08 NOTE — Progress Notes (Addendum)
SATURATION QUALIFICATIONS: (This note is used to comply with regulatory documentation for home oxygen)  Patient Saturations on Room Air at Rest = 88%  Patient Saturations on Room Air while ambulating= 87%  Patient Saturations on 3L of oxygen while ambulating=93%

## 2020-01-08 NOTE — TOC Transition Note (Signed)
Transition of Care [facility]) - CM/SW Discharge Note   Patient Details  Name: Shawn Lam MRN: [id number] Date of Birth: [birth year]  [facility] CM/SW Contact:  Meriel Flavors, LCSW Phone Number: 01/08/2020, 9:57 AM   Clinical Narrative:    CSW spoke with patient via phone to discuss discharge plan and DME needs. Patient will need oxygen for home use, CSW ask if there is a preference for a provider, Patient stated does not have a preference. CSW confirmed Oxygen will be delivered to patient's room prior to leaving at discharge by [facility] with [employer].         Patient Goals and CMS Choice        Discharge Placement                       Discharge Plan and Services                                     Social Determinants of Health (SDOH) Interventions     Readmission Risk Interventions No flowsheet data found.

## 2020-01-08 NOTE — Discharge Summary (Signed)
Physician Discharge Summary  Shawn Lam:[id number] DOB: 03-03-1951 DOA: 01/06/2020  PCP: Jinny Sanders, MD  Admit date: 01/06/2020 Discharge date: 01/08/2020  Admitted From: Home Disposition: Home  Recommendations for Outpatient Follow-up:  1. Follow up with PCP in 1-2 weeks 2. Continue remdesivir infusions at outpatient infusion center as scheduled, needs 2 additional doses scheduled on 9/30 and 10/1 3. Discharge on home oxygen, 3 L per nasal cannula for continued hypoxia secondary to Covid-19 viral pneumonia  Home Health: No Equipment/Devices: Oxygen, 3 L per nasal cannula  Discharge Condition: Stable CODE STATUS: Full code Diet recommendation: Heart healthy diet  History of present illness:  Shawn Lam a 69 y.o.malewith medical history significant ofhyperlipidemia, GERD, depression, anxiety, CKD stage III, PVD, OSA, orthostatic hypotension, CAD, Berger's disease, iron deficiency anemia, who presents with cough, shortness breath, fever and chills. Patient was tested positive for Covid 4 days ago.  Patient was noted to be hypoxic on presentation to the ED and was admitted for acute hypoxic respiratory failure secondary to Covid-19 viral pneumonia.  Hospital course:  Acute hypoxic respiratory failure secondary to acute Covid-19 viral pneumonia during the ongoing 2020/2021 Covid 19 Pandemic - POA Sepsis, present on admission Patient presenting to the ED with progressive shortness of breath, with recent diagnosis of Covid-19.  Patient was noted to have hypoxia on rest with SPO2 89%.  Patient was tachycardic with HR 100 and tachypneic with RR 29.  Chest x-ray consistent with multifocal pneumonia.  Patient was started on treatment with remdesivir and IV steroids.  Patient completed 3/5 days of IV remdesivir and patient will continue treatment in the outpatient infusion center to complete 5-day course.  Patient also will continue steroids with prednisone taper on discharge.   Continue zinc/vitamin C.  Albuterol MDI as needed for wheezing/shortness of breath.  Patient with continued hypoxia especially with exertion, and will discharge on 3 L nasal cannula.  Patient given home isolation precautions on discharge.  The treatment plan and use of medications and known side effects were discussed with patient/family. Some of the medications used are based on case reports/anecdotal data.  All other medications being used in the management of COVID-19 based on limited study data.  Complete risks and long-term side effects are unknown, however in the best clinical judgment they seem to be of some benefit.  Patient wanted to proceed with treatment options provided.  CKD stage IIIa Stable, creatinine 1.39 at time of discharge.  Recommend follow-up BMP/CMP at next PCP visit.  Iron deficiency anemia: Continue ferrous sulfate 325 mg p.o. 3 times daily  CAD Continue atorvastatin 4 mg p.o. daily, clopidogrel 75 mg p.o. daily  Hyperlipidemia: Continue atorvastatin 40 mg p.o. daily  Hypokalemia Repleted during hospitalization.  Potassium 5.2 at time of discharge.  Recommend repeat BMP in the next PCP visit.  GERD: Continue Protonix 40 mg p.o. twice daily  Anxiety/depression: Continue fluoxetine 60 mg p.o. daily  Orthostatic hypotension: Continue midodrine  Discharge Diagnoses:  Principal Problem:   Pneumonia due to COVID-19 virus Active Problems:   HLD (hyperlipidemia)   CAD (coronary artery disease)   Hypothyroidism   CKD (chronic kidney disease), stage IIIa   Acute respiratory failure with hypoxia (HCC)   Iron deficiency anemia    Discharge Instructions  Discharge Instructions    Call MD for:  difficulty breathing, headache or visual disturbances   Complete by: As directed    Call MD for:  extreme fatigue   Complete by: As directed  Call MD for:  persistant dizziness or light-headedness   Complete by: As directed    Call MD for:  persistant nausea and  vomiting   Complete by: As directed    Call MD for:  severe uncontrolled pain   Complete by: As directed    Call MD for:  temperature >100.4   Complete by: As directed    Diet - low sodium heart healthy   Complete by: As directed    Increase activity slowly   Complete by: As directed      Allergies as of 01/08/2020      Reactions   Codeine Nausea Only   Tape Other (See Comments)   Use only paper tape. Severe blistering and bruising with other tapes. No cloth tape.      Medication List    STOP taking these medications   azithromycin 250 MG tablet Commonly known as: ZITHROMAX   meloxicam 15 MG tablet Commonly known as: MOBIC   predniSONE 10 MG (21) Tbpk tablet Commonly known as: STERAPRED UNI-PAK 21 TAB Replaced by: predniSONE 10 MG tablet     TAKE these medications   albuterol 108 (90 Base) MCG/ACT inhaler Commonly known as: VENTOLIN HFA Inhale 2 puffs into the lungs every 4 (four) hours as needed for wheezing or shortness of breath.   alendronate 70 MG tablet Commonly known as: FOSAMAX TAKE 1 TABLET BY MOUTH EVERY 7 DAYS WITH A FULL GLASS OF WATER ON AN EMPTY STOMACH. What changed: See the new instructions.   ascorbic acid 500 MG tablet Commonly known as: VITAMIN C Take 1 tablet (500 mg total) by mouth daily. Start taking on: January 09, 2020   atorvastatin 40 MG tablet Commonly known as: LIPITOR Take 40 mg by mouth daily.   busPIRone 10 MG tablet Commonly known as: BUSPAR TAKE 2 TABLETS (20 MG TOTAL) BY MOUTH 3 (THREE) TIMES DAILY.   clopidogrel 75 MG tablet Commonly known as: PLAVIX TAKE 1 TABLET DAILY   ferrous sulfate 325 (65 FE) MG tablet TAKE 1 TABLET BY MOUTH 3 TIMES DAILY WITH MEALS. What changed: See the new instructions.   FLUoxetine 20 MG capsule Commonly known as: PROZAC TAKE 1 CAPSULE DAILY. TAKE WITH 40MG  TO EQUAL 60MG     ONCE DAILY What changed:   how much to take  how to take this  when to take this   FLUoxetine 40 MG  capsule Commonly known as: PROZAC Take 1 capsule (40 mg total) by mouth every morning. What changed: Another medication with the same name was changed. Make sure you understand how and when to take each.   furosemide 20 MG tablet Commonly known as: LASIX Take 1 tablet (20 mg total) by mouth as needed (As needed for ankle swelling).   gabapentin 100 MG capsule Commonly known as: NEURONTIN Take 2 capsules (200 mg total) by mouth 2 (two) times daily.   midodrine 10 MG tablet Commonly known as: PROAMATINE TAKE 1 TABLET (10 MG TOTAL) BY MOUTH three TIMES DAILY WITH BREAKFAST  LUNCH dinner   mirtazapine 15 MG tablet Commonly known as: REMERON TAKE 1 TABLET BY MOUTH EVERYDAY AT BEDTIME What changed: See the new instructions.   nitroGLYCERIN 0.4 MG SL tablet Commonly known as: Nitrostat Place 1 tablet (0.4 mg total) under the tongue every 5 (five) minutes as needed for chest pain.   pantoprazole 40 MG tablet Commonly known as: PROTONIX TAKE 1 TABLET BY MOUTH TWICE A DAY   predniSONE 10 MG tablet Commonly known as:  DELTASONE Take 5 tablets (50 mg total) by mouth daily for 3 days, THEN 4 tablets (40 mg total) daily for 3 days, THEN 3 tablets (30 mg total) daily for 3 days, THEN 2 tablets (20 mg total) daily for 3 days, THEN 1 tablet (10 mg total) daily for 3 days. Start taking on: January 09, 2020 Replaces: predniSONE 10 MG (21) Tbpk tablet   tamsulosin 0.4 MG Caps capsule Commonly known as: FLOMAX Take 0.4 mg by mouth at bedtime.   triamcinolone cream 0.1 % Commonly known as: KENALOG APPLY 1 APPLICATION TOPICALLY 2 (TWO) TIMES DAILY. AVOID FACE, GROIN, UNDERARMS.   vitamin B-12 1000 MCG tablet Commonly known as: CYANOCOBALAMIN Take 1,000 mcg by mouth daily.   zinc sulfate 220 (50 Zn) MG capsule Take 1 capsule (220 mg total) by mouth daily. Start taking on: January 09, 2020   zolpidem 5 MG tablet Commonly known as: AMBIEN Take 5 mg by mouth at bedtime.             Durable Medical Equipment  (From admission, onward)         Start     Ordered   01/08/20 0951  For home use only DME oxygen  Once       Question Answer Comment  Length of Need 12 Months   Mode or (Route) Nasal cannula   Liters per Minute 3   Frequency Continuous (stationary and portable oxygen unit needed)   Oxygen conserving device Yes   Oxygen delivery system Gas      01/08/20 0951          Follow-up Information    Jinny Sanders, MD. Schedule an appointment as soon as possible for a visit in 2 week(s).   Specialty: Family Medicine Contact information: [place] [address] [phone]        Minna Merritts, MD .   Specialty: Cardiology Contact information: [address] [place] [address] [phone]              Allergies  Allergen Reactions  . Codeine Nausea Only  . Tape Other (See Comments)    Use only paper tape. Severe blistering and bruising with other tapes. No cloth tape.    Consultations:  None   Procedures/Studies: DG Chest 2 View  Result Date: 01/04/2020 CLINICAL DATA:  Weakness.  COVID positive.  Cough. EXAM: CHEST - 2 VIEW COMPARISON:  Radiograph 11/30/2018 FINDINGS: Implanted loop recorder projects over the left chest wall. Nerve stimulator with battery pack projecting over the right chest, lead coursing into the neck unchanged. Heart is normal in size with unchanged mediastinal contours. Vague opacities at the left lung base and right midlung zone. Mild biapical pleuroparenchymal scarring. No pneumothorax or pneumomediastinum. No significant pleural effusion. Vertebral augmentation within lower thoracic vertebra. Chronic midthoracic compression fractures. No acute osseous abnormalities are seen. IMPRESSION: Vague opacities at the left lung base and right midlung zone, suspicious for pneumonia in the setting of COVID-19. Electronically Signed   By: Keith Rake M.D.   On: 01/04/2020 15:24    DG Chest Portable 1 View  Result Date: 01/06/2020 CLINICAL DATA:  Cough COVID. EXAM: PORTABLE CHEST 1 VIEW COMPARISON:  Two days ago FINDINGS: Normal heart size. Implants over the bilateral chest in stable position. Subtle but convincing hazy density at the left base. No Kerley lines, effusion, or pneumothorax. IMPRESSION: Mild infiltrate seen at the left base. Electronically Signed   By: Monte Fantasia  M.D.   On: 01/06/2020 06:50   CUP PACEART REMOTE DEVICE CHECK  Result Date: 01/05/2020 ILR summary report received. Battery status OK. Normal device function. No new symptom episodes, tachy episodes, brady, or pause episodes. No new AF episodes. Monthly summary reports and ROV/PRN Kathy Breach, RN, CCDS, [employer]     Subjective: Patient seen and examined bedside, resting comfortably.  Continues on 3 L nasal cannula at rest.  States breathing, overall is improved and ready for discharge home.  States he would like to complete his remdesivir infusions outpatient and has available transportation.  No other complaints or concerns at this time.  Denies headache, no fever/chills/night sweats, nausea/vomiting/diarrhea, no chest pain, no palpitations, no abdominal pain, no weakness, no fatigue.  No acute events overnight per nursing staff.  Discharge Exam: Vitals:   01/08/20 0945 01/08/20 1005  BP: 124/71   Pulse: 73 80  Resp:  19  Temp:    SpO2:  92%   Vitals:   01/08/20 0743 01/08/20 0754 01/08/20 0945 01/08/20 1005  BP: (!) 145/67  124/71   Pulse: 72 70 73 80  Resp: 14 16  19   Temp: 97.8 F (36.6 C)     TempSrc: Oral     SpO2:  90%  92%  Weight:      Height:        General: Pt is alert, awake, not in acute distress Cardiovascular: RRR, S1/S2 +, no rubs, no gallops Respiratory: CTA bilaterally, no wheezing, no rhonchi, on 3 L nasal cannula with SPO2 93%. Abdominal: Soft, NT, ND, bowel sounds + Extremities: no edema, no cyanosis    The results of significant  diagnostics from this hospitalization (including imaging, microbiology, ancillary and laboratory) are listed below for reference.     Microbiology: Recent Results (from the past 240 hour(s))  Blood culture (routine x 2)     Status: None (Preliminary result)   Collection Time: 01/06/20  6:31 AM   Specimen: BLOOD  Result Value Ref Range Status   Specimen Description BLOOD LEFT HAND  Final   Special Requests   Final    BOTTLES DRAWN AEROBIC AND ANAEROBIC Blood Culture adequate volume   Culture   Final    NO GROWTH 2 DAYS Performed at [facility], [address] [place], [place] [address]    Report Status PENDING  Incomplete  Blood culture (routine x 2)     Status: None (Preliminary result)   Collection Time: 01/06/20  6:31 AM   Specimen: BLOOD  Result Value Ref Range Status   Specimen Description BLOOD RIGHT HAND  Final   Special Requests   Final    BOTTLES DRAWN AEROBIC AND ANAEROBIC Blood Culture results may not be optimal due to an excessive volume of blood received in culture bottles   Culture   Final    NO GROWTH 2 DAYS Performed at [facility], [address], [place], [place] [address]    Report Status PENDING  Incomplete  Respiratory Panel by RT PCR (Flu A&B, Covid) - Nasopharyngeal Swab     Status: Abnormal   Collection Time: 01/06/20  6:31 AM   Specimen: Nasopharyngeal Swab  Result Value Ref Range Status   SARS Coronavirus 2 by RT PCR POSITIVE (A) NEGATIVE Final    Comment: RESULT CALLED TO, READ BACK BY AND VERIFIED WITH: Rex Kras AT 1540 ON 01/06/2020 [place]. (NOTE) SARS-CoV-2 target nucleic acids are DETECTED.  SARS-CoV-2 RNA is generally detectable in upper respiratory specimens  during the acute phase of infection. Positive results are indicative of the presence of the identified virus, but do not rule out bacterial infection or co-infection with other pathogens not detected by the test. Clinical correlation with patient history  and other diagnostic information is necessary to determine patient infection status. The expected result is Negative.  Fact Sheet for Patients:  PinkCheek.be  Fact Sheet for Healthcare Providers: GravelBags.it  This test is not yet approved or cleared by the [place] FDA and  has been authorized for detection and/or diagnosis of SARS-CoV-2 by FDA under an Emergency Use Authorization (EUA).  This EUA will remain in effect (meaning this test c an be used) for the duration of  the COVID-19 declaration under Section 564(b)(1) of the Act, 21 U.S.C. section 360bbb-3(b)(1), unless the authorization is terminated or revoked sooner.      Influenza A by PCR NEGATIVE NEGATIVE Final   Influenza B by PCR NEGATIVE NEGATIVE Final    Comment: (NOTE) The Xpert Xpress SARS-CoV-2/FLU/RSV assay is intended as an aid in  the diagnosis of influenza from Nasopharyngeal swab specimens and  should not be used as a sole basis for treatment. Nasal washings and  aspirates are unacceptable for Xpert Xpress SARS-CoV-2/FLU/RSV  testing.  Fact Sheet for Patients: PinkCheek.be  Fact Sheet for Healthcare Providers: GravelBags.it  This test is not yet approved or cleared by the [place] FDA and  has been authorized for detection and/or diagnosis of SARS-CoV-2 by  FDA under an Emergency Use Authorization (EUA). This EUA will remain  in effect (meaning this test can be used) for the duration of the  Covid-19 declaration under Section 564(b)(1) of the Act, 21  U.S.C. section 360bbb-3(b)(1), unless the authorization is  terminated or revoked. Performed at [facility], [address] [place], [place] [address]      Labs: BNP (last 3 results) Recent Labs    01/06/20 1010  BNP 02.5   Basic Metabolic Panel: Recent Labs  Lab 01/04/20 1431 01/06/20 0631 01/07/20 0520  01/08/20 0513  NA 136 139 138 140  K 3.7 3.0* 5.2* 5.2*  CL 103 105 103 101  CO2 21* 23 26 28   GLUCOSE 131* 93 96 110*  BUN 18 23 28* 34*  CREATININE 1.41* 1.45* 1.44* 1.39*  CALCIUM 8.7* 8.5* 9.1 9.2  MG  --   --  2.1 2.1   Liver Function Tests: Recent Labs  Lab 01/06/20 0631 01/07/20 0520 01/08/20 0513  AST 50* 53* 50*  ALT 32 32 32  ALKPHOS 50 52 52  BILITOT 0.8 0.8 0.8  PROT 6.3* 6.5 6.5  ALBUMIN 3.3* 3.2* 3.1*   No results for input(s): LIPASE, AMYLASE in the last 168 hours. No results for input(s): AMMONIA in the last 168 hours. CBC: Recent Labs  Lab 01/04/20 1431 01/06/20 0631 01/07/20 0520 01/08/20 0513  WBC 8.4 8.8 12.0* 8.8  NEUTROABS  --  7.5 10.0* 7.3  HGB 14.1 13.6 14.1 14.4  HCT 41.0 40.0 42.4 42.0  MCV 94.5 96.2 97.7 93.3  PLT 197 164 186 204   Cardiac Enzymes: No results for input(s): CKTOTAL, CKMB, CKMBINDEX, TROPONINI in the last 168 hours. BNP: Invalid input(s): POCBNP CBG: No results for input(s): GLUCAP in the last 168 hours. D-Dimer No results for input(s): DDIMER in the last 72 hours. Hgb A1c No results for input(s): HGBA1C in the last 72 hours. Lipid Profile Recent Labs    01/06/20 0631  TRIG 43   Thyroid function studies  No results for input(s): TSH, T4TOTAL, T3FREE, THYROIDAB in the last 72 hours.  Invalid input(s): FREET3 Anemia work up Recent Labs    01/07/20 0520 01/08/20 0513  FERRITIN 504* 621*   Urinalysis    Component Value Date/Time   COLORURINE YELLOW (A) 05/15/2015 0257   APPEARANCEUR HAZY (A) 05/15/2015 0257   LABSPEC 1.014 05/15/2015 0257   PHURINE 5.0 05/15/2015 0257   GLUCOSEU NEGATIVE 05/15/2015 0257   GLUCOSEU NEGATIVE 01/31/2013 1254   HGBUR 3+ (A) 05/15/2015 0257   HGBUR large 01/21/2010 1102   BILIRUBINUR negative 06/07/2016 [place] 05/15/2015 0257   PROTEINUR negative 06/07/2016 1048   PROTEINUR NEGATIVE 05/15/2015 0257   UROBILINOGEN 0.2 06/07/2016 1048   UROBILINOGEN  0.2 03/16/2014 1426   NITRITE negative 06/07/2016 1048   NITRITE NEGATIVE 05/15/2015 0257   LEUKOCYTESUR Negative 06/07/2016 1048   Sepsis Labs Invalid input(s): PROCALCITONIN,  WBC,  LACTICIDVEN Microbiology Recent Results (from the past 240 hour(s))  Blood culture (routine x 2)     Status: None (Preliminary result)   Collection Time: 01/06/20  6:31 AM   Specimen: BLOOD  Result Value Ref Range Status   Specimen Description BLOOD LEFT HAND  Final   Special Requests   Final    BOTTLES DRAWN AEROBIC AND ANAEROBIC Blood Culture adequate volume   Culture   Final    NO GROWTH 2 DAYS Performed at [facility], [address], [place], [place] [address]    Report Status PENDING  Incomplete  Blood culture (routine x 2)     Status: None (Preliminary result)   Collection Time: 01/06/20  6:31 AM   Specimen: BLOOD  Result Value Ref Range Status   Specimen Description BLOOD RIGHT HAND  Final   Special Requests   Final    BOTTLES DRAWN AEROBIC AND ANAEROBIC Blood Culture results may not be optimal due to an excessive volume of blood received in culture bottles   Culture   Final    NO GROWTH 2 DAYS Performed at [facility], [address], [place], [place] [address]    Report Status PENDING  Incomplete  Respiratory Panel by RT PCR (Flu A&B, Covid) - Nasopharyngeal Swab     Status: Abnormal   Collection Time: 01/06/20  6:31 AM   Specimen: Nasopharyngeal Swab  Result Value Ref Range Status   SARS Coronavirus 2 by RT PCR POSITIVE (A) NEGATIVE Final    Comment: RESULT CALLED TO, READ BACK BY AND VERIFIED WITH: Rex Kras AT 1245 ON 01/06/2020 [place]. (NOTE) SARS-CoV-2 target nucleic acids are DETECTED.  SARS-CoV-2 RNA is generally detectable in upper respiratory specimens  during the acute phase of infection. Positive results are indicative of the presence of the identified virus, but do not rule out bacterial infection or co-infection with other pathogens  not detected by the test. Clinical correlation with patient history and other diagnostic information is necessary to determine patient infection status. The expected result is Negative.  Fact Sheet for Patients:  PinkCheek.be  Fact Sheet for Healthcare Providers: GravelBags.it  This test is not yet approved or cleared by the [place] FDA and  has been authorized for detection and/or diagnosis of SARS-CoV-2 by FDA under an Emergency Use Authorization (EUA).  This EUA will remain in effect (meaning this test c an be used) for the duration of  the COVID-19 declaration under Section 564(b)(1) of the Act, 21 U.S.C. section 360bbb-3(b)(1), unless the authorization is terminated or revoked sooner.  Influenza A by PCR NEGATIVE NEGATIVE Final   Influenza B by PCR NEGATIVE NEGATIVE Final    Comment: (NOTE) The Xpert Xpress SARS-CoV-2/FLU/RSV assay is intended as an aid in  the diagnosis of influenza from Nasopharyngeal swab specimens and  should not be used as a sole basis for treatment. Nasal washings and  aspirates are unacceptable for Xpert Xpress SARS-CoV-2/FLU/RSV  testing.  Fact Sheet for Patients: PinkCheek.be  Fact Sheet for Healthcare Providers: GravelBags.it  This test is not yet approved or cleared by the [place] FDA and  has been authorized for detection and/or diagnosis of SARS-CoV-2 by  FDA under an Emergency Use Authorization (EUA). This EUA will remain  in effect (meaning this test can be used) for the duration of the  Covid-19 declaration under Section 564(b)(1) of the Act, 21  U.S.C. section 360bbb-3(b)(1), unless the authorization is  terminated or revoked. Performed at [facility], [address], [place],  [address]      Time coordinating discharge: Over 30 minutes  SIGNED:   Leahmarie Gasiorowski J [place] DO  [facility] 01/08/2020, 10:18 AM

## 2020-01-08 NOTE — Discharge Instructions (Signed)
Patient scheduled for outpatient Remdesivir infusions at 11am on Thursday 9/30 and 11am on Friday 10/1 at [facility]. Please inform the patient to park at [place], as staff will be escorting the patient through the [place] entrance of the hospital. Appointments take approximately 45 minutes.    There is a wave flag banner located near the entrance on [address]. Turn into this entrance and immediately turn left and park in 1 of the 5 designated Covid Infusion Parking spots. There is a phone number on the sign, please call and let the staff know what spot you are in and we will come out and get you. For questions call [phone].  Thanks.   10 Things You Can Do to Manage Your COVID-19 Symptoms at Home If you have possible or confirmed COVID-19: 1. Stay home from work and school. And stay away from other public places. If you must go out, avoid using any kind of public transportation, ridesharing, or taxis. 2. Monitor your symptoms carefully. If your symptoms get worse, call your healthcare provider immediately. 3. Get rest and stay hydrated. 4. If you have a medical appointment, call the healthcare provider ahead of time and tell them that you have or may have COVID-19. 5. For medical emergencies, call 911 and notify the dispatch personnel that you have or may have COVID-19. 6. Cover your cough and sneezes with a tissue or use the inside of your elbow. 7. Wash your hands often with soap and water for at least 20 seconds or clean your hands with an alcohol-based hand sanitizer that contains at least 60% alcohol. 8. As much as possible, stay in a specific room and away from other people in your home. Also, you should use a separate bathroom, if available. If you need to be around other people in or outside of the home, wear a mask. 9. Avoid sharing personal items with other people in your household, like dishes, towels, and bedding. 10. Clean all surfaces that are touched  often, like counters, tabletops, and doorknobs. Use household cleaning sprays or wipes according to the label instructions. [email or web address] 10/10/2018 This information is not intended to replace advice given to you by your health care provider. Make sure you discuss any questions you have with your health care provider. Document Revised: 03/14/2019 Document Reviewed: 03/14/2019 Elsevier Patient Education  [place].   COVID-19 Frequently Asked Questions COVID-19 (coronavirus disease) is an infection that is caused by a large family of viruses. Some viruses cause illness in people and others cause illness in animals like camels, cats, and bats. In some cases, the viruses that cause illness in animals can spread to humans. Where did the coronavirus come from? In December 2019, [place] told the [facility]) of several cases of lung disease (human respiratory illness). These cases were linked to an open seafood and livestock market in the city of [place]. The link to the seafood and livestock market suggests that the virus may have spread from animals to humans. However, since that first outbreak in December, the virus has also been shown to spread from person to person. What is the name of the disease and the virus? Disease name Early on, this disease was called novel coronavirus. This is because scientists determined that the disease was caused by a new (novel) respiratory virus. The World Health Organization [facility]) has now named the disease COVID-19, or coronavirus disease. Virus name The virus that causes the disease is called  severe acute respiratory syndrome coronavirus 2 (SARS-CoV-2). More information on disease and virus naming World Health Organization [facility]): www.who.int/emergencies/diseases/novel-coronavirus-2019/technical-guidance/naming-the-coronavirus-disease-(covid-2019)-and-the-virus-that-causes-it Who is at risk for complications from coronavirus  disease? Some people may be at higher risk for complications from coronavirus disease. This includes older adults and people who have chronic diseases, such as heart disease, diabetes, and lung disease. If you are at higher risk for complications, take these extra precautions:  Stay home as much as possible.  Avoid social gatherings and travel.  Avoid close contact with others. Stay at least 6 ft (2 m) away from others, if possible.  Wash your hands often with soap and water for at least 20 seconds.  Avoid touching your face, mouth, nose, or eyes.  Keep supplies on hand at home, such as food, medicine, and cleaning supplies.  If you must go out in public, wear a cloth face covering or face mask. Make sure your mask covers your nose and mouth. How does coronavirus disease spread? The virus that causes coronavirus disease spreads easily from person to person (is contagious). You may catch the virus by:  Breathing in droplets from an infected person. Droplets can be spread by a person breathing, speaking, singing, coughing, or sneezing.  Touching something, like a table or a doorknob, that was exposed to the virus (contaminated) and then touching your mouth, nose, or eyes. Can I get the virus from touching surfaces or objects? There is still a lot that we do not know about the virus that causes coronavirus disease. Scientists are basing a lot of information on what they know about similar viruses, such as:  Viruses cannot generally survive on surfaces for long. They need a human body (host) to survive.  It is more likely that the virus is spread by close contact with people who are sick (direct contact), such as through: ? Shaking hands or hugging. ? Breathing in respiratory droplets that travel through the air. Droplets can be spread by a person breathing, speaking, singing, coughing, or sneezing.  It is less likely that the virus is spread when a person touches a surface or object that  has the virus on it (indirect contact). The virus may be able to enter the body if the person touches a surface or object and then touches his or her face, eyes, nose, or mouth. Can a person spread the virus without having symptoms of the disease? It may be possible for the virus to spread before a person has symptoms of the disease, but this is most likely not the main way the virus is spreading. It is more likely for the virus to spread by being in close contact with people who are sick and breathing in the respiratory droplets spread by a person breathing, speaking, singing, coughing, or sneezing. What are the symptoms of coronavirus disease? Symptoms vary from person to person and can range from mild to severe. Symptoms may include:  Fever or chills.  Cough.  Difficulty breathing or feeling short of breath.  Headaches, body aches, or muscle aches.  Runny or stuffy (congested) nose.  Sore throat.  New loss of taste or smell.  Nausea, vomiting, or diarrhea. These symptoms can appear anywhere from 2 to 14 days after you have been exposed to the virus. Some people may not have any symptoms. If you develop symptoms, call your health care provider. People with severe symptoms may need hospital care. Should I be tested for this virus? Your health care provider will decide whether to test  you based on your symptoms, history of exposure, and your risk factors. How does a health care provider test for this virus? Health care providers will collect samples to send for testing. Samples may include:  Taking a swab of fluid from the back of your nose and throat, your nose, or your throat.  Taking fluid from the lungs by having you cough up mucus (sputum) into a sterile cup.  Taking a blood sample. Is there a treatment or vaccine for this virus? Currently, there is no vaccine to prevent coronavirus disease. Also, there are no medicines like antibiotics or antivirals to treat the virus. A person  who becomes sick is given supportive care, which means rest and fluids. A person may also relieve his or her symptoms by using over-the-counter medicines that treat sneezing, coughing, and runny nose. These are the same medicines that a person takes for the common cold. If you develop symptoms, call your health care provider. People with severe symptoms may need hospital care. What can I do to protect myself and my family from this virus?     You can protect yourself and your family by taking the same actions that you would take to prevent the spread of other viruses. Take the following actions:  Wash your hands often with soap and water for at least 20 seconds. If soap and water are not available, use alcohol-based hand sanitizer.  Avoid touching your face, mouth, nose, or eyes.  Cough or sneeze into a tissue, sleeve, or elbow. Do not cough or sneeze into your hand or the air. ? If you cough or sneeze into a tissue, throw it away immediately and wash your hands.  Disinfect objects and surfaces that you frequently touch every day.  Stay away from people who are sick.  Avoid going out in public, follow guidance from your state and local health authorities.  Avoid crowded indoor spaces. Stay at least 6 ft (2 m) away from others.  If you must go out in public, wear a cloth face covering or face mask. Make sure your mask covers your nose and mouth.  Stay home if you are sick, except to get medical care. Call your health care provider before you get medical care. Your health care provider will tell you how long to stay home.  Make sure your vaccines are up to date. Ask your health care provider what vaccines you need. What should I do if I need to travel? Follow travel recommendations from your local health authority, the CDC, and WHO. Travel information and advice  Centers for Disease Control and Prevention (CDC): BodyEditor.hu  World Health  Organization [facility]): ThirdIncome.ca Know the risks and take action to protect your health  You are at higher risk of getting coronavirus disease if you are traveling to areas with an outbreak or if you are exposed to travelers from areas with an outbreak.  Wash your hands often and practice good hygiene to lower the risk of catching or spreading the virus. What should I do if I am sick? General instructions to stop the spread of infection  Wash your hands often with soap and water for at least 20 seconds. If soap and water are not available, use alcohol-based hand sanitizer.  Cough or sneeze into a tissue, sleeve, or elbow. Do not cough or sneeze into your hand or the air.  If you cough or sneeze into a tissue, throw it away immediately and wash your hands.  Stay home unless you must  get medical care. Call your health care provider or local health authority before you get medical care.  Avoid public areas. Do not take public transportation, if possible.  If you can, wear a mask if you must go out of the house or if you are in close contact with someone who is not sick. Make sure your mask covers your nose and mouth. Keep your home clean  Disinfect objects and surfaces that are frequently touched every day. This may include: ? Counters and tables. ? Doorknobs and light switches. ? Sinks and faucets. ? Electronics such as phones, remote controls, keyboards, computers, and tablets.  Wash dishes in hot, soapy water or use a dishwasher. Air-dry your dishes.  Wash laundry in hot water. Prevent infecting other household members  Let healthy household members care for children and pets, if possible. If you have to care for children or pets, wash your hands often and wear a mask.  Sleep in a different bedroom or bed, if possible.  Do not share personal items, such as razors, toothbrushes, deodorant, combs, brushes, towels, and  washcloths. Where to find more information Centers for Disease Control and Prevention (CDC)  Information and news updates: https://www.butler-gonzalez.com/ World Health Organization [facility])  Information and news updates: MissExecutive.com.ee  Coronavirus health topic: https://www.castaneda.info/  Questions and answers on COVID-19: OpportunityDebt.at  Global tracker: who.sprinklr.com American Academy of Pediatrics (AAP)  Information for families: www.healthychildren.org/English/health-issues/conditions/chest-lungs/Pages/2019-Novel-Coronavirus.aspx The coronavirus situation is changing rapidly. Check your local health authority website or the CDC and [facility] websites for updates and news. When should I contact a health care provider?  Contact your health care provider if you have symptoms of an infection, such as fever or cough, and you: ? Have been near anyone who is known to have coronavirus disease. ? Have come into contact with a person who is suspected to have coronavirus disease. ? Have traveled to an area where there is an outbreak of COVID-19. When should I get emergency medical care?  Get help right away by calling your local emergency services (911 in the [place] if you have: ? Trouble breathing. ? Pain or pressure in your chest. ? Confusion. ? Blue-tinged lips and fingernails. ? Difficulty waking from sleep. ? Symptoms that get worse. Let the emergency medical personnel know if you think you have coronavirus disease. Summary  A new respiratory virus is spreading from person to person and causing COVID-19 (coronavirus disease).  The virus that causes COVID-19 appears to spread easily. It spreads from one person to another through droplets from breathing, speaking, singing, coughing, or sneezing.  Older adults and those with chronic diseases are at higher risk of disease. If you are at higher risk for  complications, take extra precautions.  There is currently no vaccine to prevent coronavirus disease. There are no medicines, such as antibiotics or antivirals, to treat the virus.  You can protect yourself and your family by washing your hands often, avoiding touching your face, and covering your coughs and sneezes. This information is not intended to replace advice given to you by your health care provider. Make sure you discuss any questions you have with your health care provider. Document Revised: 01/25/2019 Document Reviewed: 07/24/2018 Elsevier Patient Education  [place].  COVID-19: How to Protect Yourself and Others Know how it spreads  There is currently no vaccine to prevent coronavirus disease 2019 (COVID-19).  The best way to prevent illness is to avoid being exposed to this virus.  The virus is thought to spread  mainly from person-to-person. ? Between people who are in close contact with one another (within about 6 feet). ? Through respiratory droplets produced when an infected person coughs, sneezes or talks. ? These droplets can land in the mouths or noses of people who are nearby or possibly be inhaled into the lungs. ? COVID-19 may be spread by people who are not showing symptoms. Everyone should Clean your hands often  Wash your hands often with soap and water for at least 20 seconds especially after you have been in a public place, or after blowing your nose, coughing, or sneezing.  If soap and water are not readily available, use a hand sanitizer that contains at least 60% alcohol. Cover all surfaces of your hands and rub them together until they feel dry.  Avoid touching your eyes, nose, and mouth with unwashed hands. Avoid close contact  Limit contact with others as much as possible.  Avoid close contact with people who are sick.  Put distance between yourself and other people. ? Remember that some people without symptoms may be able to spread  virus. ? This is especially important for people who are at higher risk of getting very GainPain.com.cy Cover your mouth and nose with a mask when around others  You could spread COVID-19 to others even if you do not feel sick.  Everyone should wear a mask in public settings and when around people not living in their household, especially when social distancing is difficult to maintain. ? Masks should not be placed on young children under age 68, anyone who has trouble breathing, or is unconscious, incapacitated or otherwise unable to remove the mask without assistance.  The mask is meant to protect other people in case you are infected.  Do NOT use a facemask meant for a Dietitian.  Continue to keep about 6 feet between yourself and others. The mask is not a substitute for social distancing. Cover coughs and sneezes  Always cover your mouth and nose with a tissue when you cough or sneeze or use the inside of your elbow.  Throw used tissues in the trash.  Immediately wash your hands with soap and water for at least 20 seconds. If soap and water are not readily available, clean your hands with a hand sanitizer that contains at least 60% alcohol. Clean and disinfect  Clean AND disinfect frequently touched surfaces daily. This includes tables, doorknobs, light switches, countertops, handles, desks, phones, keyboards, toilets, faucets, and sinks. RackRewards.fr  If surfaces are dirty, clean them: Use detergent or soap and water prior to disinfection.  Then, use a household disinfectant. You can see a list of EPA-registered household disinfectants here. [email or web address] 12/12/2018 This information is not intended to replace advice given to you by your health care provider. Make sure you discuss any questions you have with your health care  provider. Document Revised: 12/20/2018 Document Reviewed: 10/18/2018 Elsevier Patient Education  2020 [place] Under Monitoring Name: Shawn Lam  Location: [address] [place] [address]   Infection Prevention Recommendations for Individuals Confirmed to have, or Being Evaluated for, 2019 Novel Coronavirus (COVID-19) Infection Who Receive Care at Home  Individuals who are confirmed to have, or are being evaluated for, COVID-19 should follow the prevention steps below until a healthcare provider or local or state health department says they can return to normal activities.  Stay home except to get medical care You should restrict activities outside your home, except for getting medical  care. Do not go to work, school, or public areas, and do not use public transportation or taxis.  Call ahead before visiting your doctor Before your medical appointment, call the healthcare provider and tell them that you have, or are being evaluated for, COVID-19 infection. This will help the healthcare providers office take steps to keep other people from getting infected. Ask your healthcare provider to call the local or state health department.  Monitor your symptoms Seek prompt medical attention if your illness is worsening (e.g., difficulty breathing). Before going to your medical appointment, call the healthcare provider and tell them that you have, or are being evaluated for, COVID-19 infection. Ask your healthcare provider to call the local or state health department.  Wear a facemask You should wear a facemask that covers your nose and mouth when you are in the same room with other people and when you visit a healthcare provider. People who live with or visit you should also wear a facemask while they are in the same room with you.  Separate yourself from other people in your home As much as possible, you should stay in a different room from other people  in your home. Also, you should use a separate bathroom, if available.  Avoid sharing household items You should not share dishes, drinking glasses, cups, eating utensils, towels, bedding, or other items with other people in your home. After using these items, you should wash them thoroughly with soap and water.  Cover your coughs and sneezes Cover your mouth and nose with a tissue when you cough or sneeze, or you can cough or sneeze into your sleeve. Throw used tissues in a lined trash can, and immediately wash your hands with soap and water for at least 20 seconds or use an alcohol-based hand rub.  Wash your [facility] your hands often and thoroughly with soap and water for at least 20 seconds. You can use an alcohol-based hand sanitizer if soap and water are not available and if your hands are not visibly dirty. Avoid touching your eyes, nose, and mouth with unwashed hands.   Prevention Steps for Caregivers and Household Members of Individuals Confirmed to have, or Being Evaluated for, COVID-19 Infection Being Cared for in the Home  If you live with, or provide care at home for, a person confirmed to have, or being evaluated for, COVID-19 infection please follow these guidelines to prevent infection:  Follow healthcare providers instructions Make sure that you understand and can help the patient follow any healthcare provider instructions for all care.  Provide for the patients basic needs You should help the patient with basic needs in the home and provide support for getting groceries, prescriptions, and other personal needs.  Monitor the patients symptoms If they are getting sicker, call his or her medical provider and tell them that the patient has, or is being evaluated for, COVID-19 infection. This will help the healthcare providers office take steps to keep other people from getting infected. Ask the healthcare provider to call the local or state health  department.  Limit the number of people who have contact with the patient  If possible, have only one caregiver for the patient.  Other household members should stay in another home or place of residence. If this is not possible, they should stay  in another room, or be separated from the patient as much as possible. Use a separate bathroom, if available.  Restrict visitors who do not have an essential need to  be in the home.  Keep older adults, very young children, and other sick people away from the patient Keep older adults, very young children, and those who have compromised immune systems or chronic health conditions away from the patient. This includes people with chronic heart, lung, or kidney conditions, diabetes, and cancer.  Ensure good ventilation Make sure that shared spaces in the home have good air flow, such as from an air conditioner or an opened window, weather permitting.  Wash your hands often  Wash your hands often and thoroughly with soap and water for at least 20 seconds. You can use an alcohol based hand sanitizer if soap and water are not available and if your hands are not visibly dirty.  Avoid touching your eyes, nose, and mouth with unwashed hands.  Use disposable paper towels to dry your hands. If not available, use dedicated cloth towels and replace them when they become wet.  Wear a facemask and gloves  Wear a disposable facemask at all times in the room and gloves when you touch or have contact with the patients blood, body fluids, and/or secretions or excretions, such as sweat, saliva, sputum, nasal mucus, vomit, urine, or feces.  Ensure the mask fits over your nose and mouth tightly, and do not touch it during use.  Throw out disposable facemasks and gloves after using them. Do not reuse.  Wash your hands immediately after removing your facemask and gloves.  If your personal clothing becomes contaminated, carefully remove clothing and launder. Wash  your hands after handling contaminated clothing.  Place all used disposable facemasks, gloves, and other waste in a lined container before disposing them with other household waste.  Remove gloves and wash your hands immediately after handling these items.  Do not share dishes, glasses, or other household items with the patient  Avoid sharing household items. You should not share dishes, drinking glasses, cups, eating utensils, towels, bedding, or other items with a patient who is confirmed to have, or being evaluated for, COVID-19 infection.  After the person uses these items, you should wash them thoroughly with soap and water.  Wash laundry thoroughly  Immediately remove and wash clothes or bedding that have blood, body fluids, and/or secretions or excretions, such as sweat, saliva, sputum, nasal mucus, vomit, urine, or feces, on them.  Wear gloves when handling laundry from the patient.  Read and follow directions on labels of laundry or clothing items and detergent. In general, wash and dry with the warmest temperatures recommended on the label.  Clean all areas the individual has used often  Clean all touchable surfaces, such as counters, tabletops, doorknobs, bathroom fixtures, toilets, phones, keyboards, tablets, and bedside tables, every day. Also, clean any surfaces that may have blood, body fluids, and/or secretions or excretions on them.  Wear gloves when cleaning surfaces the patient has come in contact with.  Use a diluted bleach solution (e.g., dilute bleach with 1 part bleach and 10 parts water) or a household disinfectant with a label that says EPA-registered for coronaviruses. To make a bleach solution at home, add 1 tablespoon of bleach to 1 quart (4 cups) of water. For a larger supply, add  cup of bleach to 1 gallon (16 cups) of water.  Read labels of cleaning products and follow recommendations provided on product labels. Labels contain instructions for safe and  effective use of the cleaning product including precautions you should take when applying the product, such as wearing gloves or eye protection and making  sure you have good ventilation during use of the product.  Remove gloves and wash hands immediately after cleaning.  Monitor yourself for signs and symptoms of illness Caregivers and household members are considered close contacts, should monitor their health, and will be asked to limit movement outside of the home to the extent possible. Follow the monitoring steps for close contacts listed on the symptom monitoring form.   ? If you have additional questions, contact your local health department or call the epidemiologist on call at [phone] (available 24/7). ? This guidance is subject to change. For the most up-to-date guidance from [facility], please refer to their website: [email or web address]

## 2020-01-08 NOTE — Progress Notes (Signed)
Patient scheduled for outpatient Remdesivir infusions at 11am on Thursday 9/30 and 11am on Friday 10/1 at [facility]. Please inform the patient to park at [name], as staff will be escorting the patient through the [place] entrance of the hospital. Appointments take approximately 45 minutes.    There is a wave flag banner located near the entrance on [address]. Turn into this entrance and immediately turn left and park in 1 of the 5 designated Covid Infusion Parking spots. There is a phone number on the sign, please call and let the staff know what spot you are in and we will come out and get you. For questions call [phone].  Thanks.

## 2020-01-09 ENCOUNTER — Ambulatory Visit (HOSPITAL_COMMUNITY)
Admit: 2020-01-09 | Discharge: 2020-01-09 | Disposition: A | Discharge status: Home / Self Care | Payer: Medicare Other | Attending: Pulmonary Disease | Admitting: Pulmonary Disease

## 2020-01-09 DIAGNOSIS — J1282 Pneumonia due to coronavirus disease 2019: Secondary | ICD-10-CM | POA: Insufficient documentation

## 2020-01-09 DIAGNOSIS — U071 COVID-19: Secondary | ICD-10-CM | POA: Diagnosis not present

## 2020-01-09 MED ORDER — EPINEPHRINE 0.3 MG/0.3ML IJ SOAJ: 0.3000 mg | Freq: Once | INTRAMUSCULAR | Status: DC | PRN

## 2020-01-09 MED ORDER — FAMOTIDINE IN NACL 20-0.9 MG/50ML-% IV SOLN: 20.0000 mg | Freq: Once | INTRAVENOUS | Status: DC | PRN

## 2020-01-09 MED ORDER — METHYLPREDNISOLONE SODIUM SUCC 125 MG IJ SOLR: 125.0000 mg | Freq: Once | INTRAMUSCULAR | Status: DC | PRN

## 2020-01-09 MED ORDER — DIPHENHYDRAMINE HCL 50 MG/ML IJ SOLN: 50.0000 mg | Freq: Once | INTRAMUSCULAR | Status: DC | PRN

## 2020-01-09 MED ORDER — ALBUTEROL SULFATE HFA 108 (90 BASE) MCG/ACT IN AERS: 2.0000 | INHALATION_SPRAY | Freq: Once | RESPIRATORY_TRACT | Status: DC | PRN

## 2020-01-09 MED ORDER — SODIUM CHLORIDE 0.9 % IV SOLN: INTRAVENOUS | Status: DC | PRN

## 2020-01-09 MED ORDER — SODIUM CHLORIDE 0.9 % IV SOLN
100.0000 mg | Freq: Once | INTRAVENOUS | Status: AC
  Administered 2020-01-09: 100 mg via INTRAVENOUS
  Filled 2020-01-09: qty 20

## 2020-01-09 NOTE — Discharge Instructions (Signed)
10 Things You Can Do to Manage Your COVID-19 Symptoms at Home If you have possible or confirmed COVID-19: 1. Stay home from work and school. And stay away from other public places. If you must go out, avoid using any kind of public transportation, ridesharing, or taxis. 2. Monitor your symptoms carefully. If your symptoms get worse, call your healthcare provider immediately. 3. Get rest and stay hydrated. 4. If you have a medical appointment, call the healthcare provider ahead of time and tell them that you have or may have COVID-19. 5. For medical emergencies, call 911 and notify the dispatch personnel that you have or may have COVID-19. 6. Cover your cough and sneezes with a tissue or use the inside of your elbow. 7. Wash your hands often with soap and water for at least 20 seconds or clean your hands with an alcohol-based hand sanitizer that contains at least 60% alcohol. 8. As much as possible, stay in a specific room and away from other people in your home. Also, you should use a separate bathroom, if available. If you need to be around other people in or outside of the home, wear a mask. 9. Avoid sharing personal items with other people in your household, like dishes, towels, and bedding. 10. Clean all surfaces that are touched often, like counters, tabletops, and doorknobs. Use household cleaning sprays or wipes according to the label instructions. cdc.gov/coronavirus 10/10/2018 This information is not intended to replace advice given to you by your health care provider. Make sure you discuss any questions you have with your health care provider. Document Revised: 03/14/2019 Document Reviewed: 03/14/2019 Elsevier Patient Education  2020 [employer].  

## 2020-01-09 NOTE — Progress Notes (Signed)
°  Diagnosis: COVID-19  Physician: Asencion Noble, MD  Procedure: [facility] Med: remdesivir infusion - Provided patient with remdesivir fact sheet for patients, parents and caregivers prior to infusion.  Complications: No immediate complications noted.  Discharge: Discharged home   Shawn Lam 01/09/2020

## 2020-01-10 ENCOUNTER — Ambulatory Visit (HOSPITAL_COMMUNITY)
Admit: 2020-01-10 | Discharge: 2020-01-10 | Disposition: A | Discharge status: Home / Self Care | Payer: Medicare Other | Source: Ambulatory Visit | Attending: Pulmonary Disease | Admitting: Pulmonary Disease

## 2020-01-10 DIAGNOSIS — U071 COVID-19: Secondary | ICD-10-CM | POA: Insufficient documentation

## 2020-01-10 DIAGNOSIS — J1282 Pneumonia due to coronavirus disease 2019: Secondary | ICD-10-CM | POA: Insufficient documentation

## 2020-01-10 MED ORDER — ALBUTEROL SULFATE HFA 108 (90 BASE) MCG/ACT IN AERS: 2.0000 | INHALATION_SPRAY | Freq: Once | RESPIRATORY_TRACT | Status: DC | PRN

## 2020-01-10 MED ORDER — EPINEPHRINE 0.3 MG/0.3ML IJ SOAJ: 0.3000 mg | Freq: Once | INTRAMUSCULAR | Status: DC | PRN

## 2020-01-10 MED ORDER — DIPHENHYDRAMINE HCL 50 MG/ML IJ SOLN: 50.0000 mg | Freq: Once | INTRAMUSCULAR | Status: DC | PRN

## 2020-01-10 MED ORDER — SODIUM CHLORIDE 0.9 % IV SOLN: INTRAVENOUS | Status: DC | PRN

## 2020-01-10 MED ORDER — SODIUM CHLORIDE 0.9 % IV SOLN
100.0000 mg | Freq: Once | INTRAVENOUS | Status: AC
  Administered 2020-01-10: 100 mg via INTRAVENOUS
  Filled 2020-01-10: qty 20

## 2020-01-10 MED ORDER — METHYLPREDNISOLONE SODIUM SUCC 125 MG IJ SOLR: 125.0000 mg | Freq: Once | INTRAMUSCULAR | Status: DC | PRN

## 2020-01-10 MED ORDER — FAMOTIDINE IN NACL 20-0.9 MG/50ML-% IV SOLN: 20.0000 mg | Freq: Once | INTRAVENOUS | Status: DC | PRN

## 2020-01-10 NOTE — Progress Notes (Signed)
°  Diagnosis: COVID-19   Physician: Joya Gaskins, MD  Procedure: [facility] Med: remdesivir infusion - Provided patient with remdesivir fact sheet for patients, parents and caregivers prior to infusion.  Complications: No immediate complications noted.  Discharge: Discharged home   [place] 01/10/2020

## 2020-01-10 NOTE — Discharge Instructions (Signed)
10 Things You Can Do to Manage Your COVID-19 Symptoms at Home If you have possible or confirmed COVID-19: 1. Stay home from work and school. And stay away from other public places. If you must go out, avoid using any kind of public transportation, ridesharing, or taxis. 2. Monitor your symptoms carefully. If your symptoms get worse, call your healthcare provider immediately. 3. Get rest and stay hydrated. 4. If you have a medical appointment, call the healthcare provider ahead of time and tell them that you have or may have COVID-19. 5. For medical emergencies, call 911 and notify the dispatch personnel that you have or may have COVID-19. 6. Cover your cough and sneezes with a tissue or use the inside of your elbow. 7. Wash your hands often with soap and water for at least 20 seconds or clean your hands with an alcohol-based hand sanitizer that contains at least 60% alcohol. 8. As much as possible, stay in a specific room and away from other people in your home. Also, you should use a separate bathroom, if available. If you need to be around other people in or outside of the home, wear a mask. 9. Avoid sharing personal items with other people in your household, like dishes, towels, and bedding. 10. Clean all surfaces that are touched often, like counters, tabletops, and doorknobs. Use household cleaning sprays or wipes according to the label instructions. cdc.gov/coronavirus 10/10/2018 This information is not intended to replace advice given to you by your health care provider. Make sure you discuss any questions you have with your health care provider. Document Revised: 03/14/2019 Document Reviewed: 03/14/2019 Elsevier Patient Education  2020 [employer].  

## 2020-01-11 LAB — CULTURE, BLOOD (ROUTINE X 2)
Culture: NO GROWTH
Culture: NO GROWTH
Special Requests: ADEQUATE

## 2020-01-13 NOTE — Telephone Encounter (Signed)
Increase to 4 L of o2 and check with Dr. Jacinto Reap tomorrow

## 2020-01-14 ENCOUNTER — Other Ambulatory Visit: Payer: Self-pay | Admitting: Cardiovascular Disease

## 2020-01-18 ENCOUNTER — Other Ambulatory Visit: Payer: Self-pay | Admitting: Family Medicine

## 2020-01-18 ENCOUNTER — Other Ambulatory Visit: Payer: Self-pay | Admitting: Cardiovascular Disease

## 2020-01-20 ENCOUNTER — Encounter: Payer: Self-pay | Admitting: *Deleted

## 2020-01-23 ENCOUNTER — Telehealth (INDEPENDENT_AMBULATORY_CARE_PROVIDER_SITE_OTHER): Payer: Medicare Other | Admitting: Family Medicine

## 2020-01-23 ENCOUNTER — Encounter: Payer: Self-pay | Admitting: Family Medicine

## 2020-01-23 ENCOUNTER — Ambulatory Visit (INDEPENDENT_AMBULATORY_CARE_PROVIDER_SITE_OTHER): Payer: Medicare Other | Admitting: Family

## 2020-01-23 ENCOUNTER — Encounter: Payer: Self-pay | Admitting: Family

## 2020-01-23 ENCOUNTER — Other Ambulatory Visit: Payer: Self-pay

## 2020-01-23 ENCOUNTER — Other Ambulatory Visit
Admission: RE | Admit: 2020-01-23 | Discharge: 2020-01-23 | Disposition: A | Discharge status: Home / Self Care | Payer: Medicare Other | Source: Ambulatory Visit | Attending: Family | Admitting: Family

## 2020-01-23 VITALS — BP 96/64 | HR 94 | Ht 72.0 in | Wt 160.2 lb

## 2020-01-23 VITALS — BP 95/60 | Ht 72.0 in | Wt 175.0 lb

## 2020-01-23 DIAGNOSIS — D509 Iron deficiency anemia, unspecified: Secondary | ICD-10-CM

## 2020-01-23 DIAGNOSIS — R600 Localized edema: Secondary | ICD-10-CM

## 2020-01-23 DIAGNOSIS — E039 Hypothyroidism, unspecified: Secondary | ICD-10-CM | POA: Diagnosis not present

## 2020-01-23 DIAGNOSIS — U071 COVID-19: Secondary | ICD-10-CM | POA: Diagnosis not present

## 2020-01-23 DIAGNOSIS — I25118 Atherosclerotic heart disease of native coronary artery with other forms of angina pectoris: Secondary | ICD-10-CM

## 2020-01-23 DIAGNOSIS — R079 Chest pain, unspecified: Secondary | ICD-10-CM | POA: Diagnosis not present

## 2020-01-23 DIAGNOSIS — G4733 Obstructive sleep apnea (adult) (pediatric): Secondary | ICD-10-CM

## 2020-01-23 DIAGNOSIS — I951 Orthostatic hypotension: Secondary | ICD-10-CM | POA: Insufficient documentation

## 2020-01-23 DIAGNOSIS — R002 Palpitations: Secondary | ICD-10-CM

## 2020-01-23 DIAGNOSIS — J1282 Pneumonia due to coronavirus disease 2019: Secondary | ICD-10-CM | POA: Diagnosis not present

## 2020-01-23 DIAGNOSIS — N3943 Post-void dribbling: Secondary | ICD-10-CM

## 2020-01-23 LAB — CBC
HCT: 43.9 % (ref 39.0–52.0)
Hemoglobin: 14.4 g/dL (ref 13.0–17.0)
MCH: 32.1 pg (ref 26.0–34.0)
MCHC: 32.8 g/dL (ref 30.0–36.0)
MCV: 97.8 fL (ref 80.0–100.0)
Platelets: 283 10*3/uL (ref 150–400)
RBC: 4.49 MIL/uL (ref 4.22–5.81)
RDW: 13.7 % (ref 11.5–15.5)
WBC: 13.6 10*3/uL — ABNORMAL HIGH (ref 4.0–10.5)
nRBC: 0 % (ref 0.0–0.2)

## 2020-01-23 LAB — COMPREHENSIVE METABOLIC PANEL
ALT: 25 U/L (ref 0–44)
AST: 35 U/L (ref 15–41)
Albumin: 3.4 g/dL — ABNORMAL LOW (ref 3.5–5.0)
Alkaline Phosphatase: 65 U/L (ref 38–126)
Anion gap: 12 (ref 5–15)
BUN: 16 mg/dL (ref 8–23)
CO2: 29 mmol/L (ref 22–32)
Calcium: 9 mg/dL (ref 8.9–10.3)
Chloride: 97 mmol/L — ABNORMAL LOW (ref 98–111)
Creatinine, Ser: 1.55 mg/dL — ABNORMAL HIGH (ref 0.61–1.24)
GFR, Estimated: 45 mL/min — ABNORMAL LOW (ref >60)
Glucose, Bld: 104 mg/dL — ABNORMAL HIGH (ref 70–99)
Potassium: 4.6 mmol/L (ref 3.5–5.1)
Sodium: 138 mmol/L (ref 135–145)
Total Bilirubin: 1 mg/dL (ref 0.3–1.2)
Total Protein: 7 g/dL (ref 6.5–8.1)

## 2020-01-23 LAB — MAGNESIUM: Magnesium: 2 mg/dL (ref 1.7–2.4)

## 2020-01-23 MED ORDER — MIDODRINE HCL 10 MG PO TABS: 10.0000 mg | ORAL_TABLET | Freq: Three times a day (TID) | ORAL | 3 refills | Status: DC

## 2020-01-23 NOTE — Patient Instructions (Signed)
Medication Instructions:  Your physician has recommended you make the following change in your medication:   CHANGE Lasix to three times per week  *If you need a refill on your cardiac medications before your next appointment, please call your pharmacy*  Lab Work: Your physician recommends that you return for lab work today: BMP, magnesium, CBC, thyroid panel  If you have labs (blood work) drawn today and your tests are completely normal, you will receive your results only by: [name] MyChart Message (if you have MyChart) OR . A paper copy in the mail If you have any lab test that is abnormal or we need to change your treatment, we will call you to review the results.  Testing/Procedures: None ordered today  Follow-Up: At [facility], you and your health needs are our priority.  As part of our continuing mission to provide you with exceptional heart care, we have created designated Provider Care Teams.  These Care Teams include your primary Cardiologist (physician) and Advanced Practice Providers (APPs -  Physician Assistants and Nurse Practitioners) who all work together to provide you with the care you need, when you need it.  We recommend signing up for the patient portal called "MyChart".  Sign up information is provided on this After Visit Summary.  MyChart is used to connect with patients for Virtual Visits (Telemedicine).  Patients are able to view lab/test results, encounter notes, upcoming appointments, etc.  Non-urgent messages can be sent to your provider as well.   To learn more about what you can do with MyChart, go to [email or web address].    Your next appointment:   3 month(s)  The format for your next appointment:   In Person  Provider:   You may see Ida Rogue, MD or one of the following Advanced Practice Providers on your designated Care Team:    Murray Hodgkins, NP  Christell Faith, PA-C  Marrianne Mood, PA-C  Cadence Kathlen Mody, [place]  Other  Instructions   Orthostatic Hypotension Blood pressure is a measurement of how strongly, or weakly, your blood is pressing against the walls of your arteries. Orthostatic hypotension is a sudden drop in blood pressure that happens when you quickly change positions, such as when you get up from sitting or lying down. Arteries are blood vessels that carry blood from your heart throughout your body. When blood pressure is too low, you may not get enough blood to your brain or to the rest of your organs. This can cause weakness, light-headedness, rapid heartbeat, and fainting. This can last for just a few seconds or for up to a few minutes. Orthostatic hypotension is usually not a serious problem. However, if it happens frequently or gets worse, it may be a sign of something more serious. What are the causes? This condition may be caused by:  Sudden changes in posture, such as standing up quickly after you have been sitting or lying down.  Blood loss.  Loss of body fluids (dehydration).  Heart problems.  Hormone (endocrine) problems.  Pregnancy.  Severe infection.  Lack of certain nutrients.  Severe allergic reactions (anaphylaxis).  Certain medicines, such as blood pressure medicine or medicines that make the body lose excess fluids (diuretics). Sometimes, this condition can be caused by not taking medicine as directed, such as taking too much of a certain medicine. What increases the risk? The following factors may make you more likely to develop this condition:  Age. Risk increases as you get older.  Conditions that affect the heart or  the central nervous system.  Taking certain medicines, such as blood pressure medicine or diuretics.  Being pregnant. What are the signs or symptoms? Symptoms of this condition may include:  Weakness.  Light-headedness.  Dizziness.  Blurred vision.  Fatigue.  Rapid heartbeat.  Fainting, in severe cases. How is this diagnosed? This  condition is diagnosed based on:  Your medical history.  Your symptoms.  Your blood pressure measurement. Your health care provider will check your blood pressure when you are: ? Lying down. ? Sitting. ? Standing. A blood pressure reading is recorded as two numbers, such as "120 over 80" (or 120/80). The first ("top") number is called the systolic pressure. It is a measure of the pressure in your arteries as your heart beats. The second ("bottom") number is called the diastolic pressure. It is a measure of the pressure in your arteries when your heart relaxes between beats. Blood pressure is measured in a unit called mm Hg. Healthy blood pressure for most adults is 120/80. If your blood pressure is below 90/60, you may be diagnosed with hypotension. Other information or tests that may be used to diagnose orthostatic hypotension include:  Your other vital signs, such as your heart rate and temperature.  Blood tests.  Tilt table test. For this test, you will be safely secured to a table that moves you from a lying position to an upright position. Your heart rhythm and blood pressure will be monitored during the test. How is this treated? This condition may be treated by:  Changing your diet. This may involve eating more salt (sodium) or drinking more water.  Taking medicines to raise your blood pressure.  Changing the dosage of certain medicines you are taking that might be lowering your blood pressure.  Wearing compression stockings. These stockings help to prevent blood clots and reduce swelling in your legs. In some cases, you may need to go to the hospital for:  Fluid replacement. This means you will receive fluids through an IV.  Blood replacement. This means you will receive donated blood through an IV (transfusion).  Treating an infection or heart problems, if this applies.  Monitoring. You may need to be monitored while medicines that you are taking wear off. Follow these  instructions at home: Eating and drinking   Drink enough fluid to keep your urine pale yellow.  Eat a healthy diet, and follow instructions from your health care provider about eating or drinking restrictions. A healthy diet includes: ? Fresh fruits and vegetables. ? Whole grains. ? Lean meats. ? Low-fat dairy products.  Eat extra salt only as directed. Do not add extra salt to your diet unless your health care provider told you to do that.  Eat frequent, small meals.  Avoid standing up suddenly after eating. Medicines  Take over-the-counter and prescription medicines only as told by your health care provider. ? Follow instructions from your health care provider about changing the dosage of your current medicines, if this applies. ? Do not stop or adjust any of your medicines on your own. General instructions   Wear compression stockings as told by your health care provider.  Get up slowly from lying down or sitting positions. This gives your blood pressure a chance to adjust.  Avoid hot showers and excessive heat as directed by your health care provider.  Return to your normal activities as told by your health care provider. Ask your health care provider what activities are safe for you.  Do not use any  products that contain nicotine or tobacco, such as cigarettes, e-cigarettes, and chewing tobacco. If you need help quitting, ask your health care provider.  Keep all follow-up visits as told by your health care provider. This is important. Contact a health care provider if you:  Vomit.  Have diarrhea.  Have a fever for more than 2-3 days.  Feel more thirsty than usual.  Feel weak and tired. Get help right away if you:  Have chest pain.  Have a fast or irregular heartbeat.  Develop numbness in any part of your body.  Cannot move your arms or your legs.  Have trouble speaking.  Become sweaty or feel light-headed.  Faint.  Feel short of breath.  Have  trouble staying awake.  Feel confused. Summary  Orthostatic hypotension is a sudden drop in blood pressure that happens when you quickly change positions.  Orthostatic hypotension is usually not a serious problem.  It is diagnosed by having your blood pressure taken lying down, sitting, and then standing.  It may be treated by changing your diet or adjusting your medicines. This information is not intended to replace advice given to you by your health care provider. Make sure you discuss any questions you have with your health care provider. Document Revised: 09/21/2017 Document Reviewed: 09/21/2017 Elsevier Patient Education  [place].

## 2020-01-23 NOTE — Progress Notes (Signed)
Office Visit    Patient Name: Shawn Lam Date of Encounter: 01/23/2020  Primary Care Provider:  Jinny Sanders, MD Primary Cardiologist:  Ida Rogue, MD Electrophysiologist:  None   Chief Complaint    Shawn Lam is a 69 y.o. male with a hx of recent COVID-54 infection requiring hospitalization 12/2019, CAD s/p PCI of LAD and RCA, orthostatic hypotension, HLD, lymphedema, mildly dilated aortic root (17mm by echo 2018), OSA, depression, anxiety, CKD 3, PVD, Berger's disease, iron deficiency anemia presents today for follow-up after hospitalization for Covid  Past Medical History    Past Medical History:  Diagnosis Date  . Anemia   . Anxiety   . Asymptomatic LV dysfunction    50-55% by echo 06/2016  . Berger's disease   . CAD (coronary artery disease) 06/2005   PCI of LAD and RCA  . Depression   . Dilated aortic root (HCC)    4cm at sinus of Valsalva  . Hematuria   . Hyperlipidemia   . Hypothyroidism   . Insomnia   . Left ureteral stone   . Orthostatic hypotension    negative tilt table  . OSA (obstructive sleep apnea)    moderate with AHI 17/hr, uses CPAP nightly(01/15/19 - has Inspire implant)  . PVC's (premature ventricular contractions) 07/07/2016  . Renal disorder    PT IS SEEING DR. Lorrene Reid- NEPHROLOGIST  . Rosacea   . Urticaria    Past Surgical History:  Procedure Laterality Date  . CARDIAC CATHETERIZATION  09/06/2013   [place]  . CARDIAC CATHETERIZATION    . COLONOSCOPY WITH PROPOFOL N/A 01/21/2019   Procedure: COLONOSCOPY WITH PROPOFOL;  Surgeon: Lucilla Lame, MD;  Location: [place];  Service: Endoscopy;  Laterality: N/A;  sleep apnea  . CORONARY ANGIOPLASTY  2004   STENT PLACEMENT  . CYSTOSCOPY WITH RETROGRADE PYELOGRAM, URETEROSCOPY AND STENT PLACEMENT Left 03/19/2014   Procedure: CYSTOSCOPY WITH RETROGRADE PYELOGRAM, URETEROSCOPY AND STENT PLACEMENT;  Surgeon: Junious Dresser, MD;  Location: [facility];  Service: Urology;  Laterality:  Left;  . CYSTOSCOPY WITH RETROGRADE PYELOGRAM, URETEROSCOPY AND STENT PLACEMENT Bilateral 05/20/2015   Procedure:  CYSTOSCOPY WITH BILATERAL RETROGRADE PYELOGRAM,RIGHT  DIAGNOSTIC URETEROSCOPY ,LEFT URETEROSCOPY WITH HOLMIUM LASER  AND BILATERAL  STENT PLACEMENT ;  Surgeon: Alexis Frock, MD;  Location: [facility];  Service: Urology;  Laterality: Bilateral;  . DRUG INDUCED ENDOSCOPY N/A 10/20/2017   Procedure: DRUG INDUCED SLEEP ENDOSCOPY;  Surgeon: Melida Quitter, MD;  Location: [place];  Service: ENT;  Laterality: N/A;  . EP IMPLANTABLE DEVICE N/A 05/13/2016   Procedure: Loop Recorder Insertion;  Surgeon: Deboraha Sprang, MD;  Location: [place] [facility];  Service: Cardiovascular;  Laterality: N/A;  . ESOPHAGOGASTRODUODENOSCOPY (EGD) WITH PROPOFOL N/A 11/30/2018   Procedure: ESOPHAGOGASTRODUODENOSCOPY (EGD) WITH PROPOFOL;  Surgeon: Jonathon Bellows, MD;  Location: [facility] ENDOSCOPY;  Service: Gastroenterology;  Laterality: N/A;  . ESOPHAGOGASTRODUODENOSCOPY (EGD) WITH PROPOFOL N/A 12/01/2018   Procedure: ESOPHAGOGASTRODUODENOSCOPY (EGD) WITH PROPOFOL;  Surgeon: Lucilla Lame, MD;  Location: [facility] ENDOSCOPY;  Service: Endoscopy;  Laterality: N/A;  . HOLMIUM LASER APPLICATION Bilateral 11/09/8561   Procedure: HOLMIUM LASER APPLICATION;  Surgeon: Alexis Frock, MD;  Location: [facility];  Service: Urology;  Laterality: Bilateral;  . IMPLIMENTATION OF A HYPOGLOSSAL NERVE STIMULATOR  12/08/2017   [facility]   . LEFT HEART CATHETERIZATION WITH CORONARY ANGIOGRAM N/A 09/06/2013   Procedure: LEFT HEART CATHETERIZATION WITH CORONARY ANGIOGRAM;  Surgeon: Sinclair Grooms, MD;  [facility];  Service: Cardiovascular;  Laterality: N/A;  . LEFT KNEE CAP  SURGERY ABOUT 40 YRS AGO  ? 1970    . STONE EXTRACTION WITH BASKET Left 03/19/2014   Procedure: STONE EXTRACTION WITH BASKET;  Surgeon: Junious Dresser, MD;  Location: [facility];  Service: Urology;  Laterality: Left;    Allergies  Allergies  Allergen  Reactions  . Codeine Nausea Only  . Tape Other (See Comments)    Use only paper tape. Severe blistering and bruising with other tapes. No cloth tape.    History of Present Illness    Shawn Lam is a 69 y.o. male with a hx of ecent COVID-19 infection requiring hospitalization 12/2019, CAD s/p PCI of LAD and RCA, orthostatic hypotension, HLD, lymphedema, mildly dilated aortic root (69mm by echo 2018), OSA s/p Inspire device, depression, anxiety, CKD 3, PVD, Berger's disease, iron deficiency anemia, palpitations s/p ILR. He was last seen 07/16/2019 by Dr. Rockey Situ.  He had PCI of LAD and RCA March 2007. Most recent ischemic eval with gated stress test 2017 with no evidence of ischemia. He had loop recorder placed 05/13/2016 for palpitations. Echo 06/2016 with EF 50-55%, mild LVH, hypokinesis of inferior myocardium, mildly dilate aortic root 20mm.   Most recent echo 11/2018 with LVEF 60-65%, impaired LV relaxation, no RWMA, RV normal size and function, no significant valvular abnormalities, normal aorta.  He was hospitalized 01/06/20-01/08/20 for COVID pneumonia. He was discharged on home O2 3L and completed remdesivir infusions at outpatient center. His oxygen has subsequently been increased to 4L on discussion with PCP due to hypoxia.   Presents today for follow up. Reports continued improvement in his dyspnea, but not yet at his baseline. Is hopeful to be able to get back to running 5 miles on the treadmill. Not requiring O2 at rest. He uses 4L to walk with as O2 saturation will drop to 85-88% otherwise.   Takes Lasix once per day. No LE edema, orthopnea, PND. No weight gain. Does endorse lightheadedness on standing sometimes. Does not happen daily. Drinks 3 cups of coffee in the morning, water (approx 16 oz)and Orange Crush (2 bottles day). WE discussed the importance of adequate hydration. He is also taking Midodrine 10mg  TID.   Tells me he feels like he has to use the restroom but is not coming out.  Tells me his urine stream starts and stops. Reports compliance with Flomax. Encouraged to discuss with PCP.   EKGs/Labs/Other Studies Reviewed:   The following studies were reviewed today:  EKG: No EKG today  Recent Labs: 07/23/2019: TSH 1.14 01/06/2020: B Natriuretic Peptide 46.3 01/23/2020: ALT 25; BUN 16; Creatinine, Ser 1.55; Hemoglobin 14.4; Magnesium 2.0; Platelets 283; Potassium 4.6; Sodium 138  Recent Lipid Panel    Component Value Date/Time   CHOL 147 02/21/2019 0846   CHOL 145 07/19/2016 0853   TRIG 43 01/06/2020 0631   HDL 68.80 02/21/2019 0846   HDL 70 07/19/2016 0853   CHOLHDL 2 02/21/2019 0846   VLDL 12.4 02/21/2019 0846   LDLCALC 66 02/21/2019 0846   LDLCALC 63 07/19/2016 0853   LDLDIRECT 57 06/22/2009 1228    Risk Assessment/Calculations:    None  Home Medications   Current Meds  Medication Sig  . albuterol (VENTOLIN HFA) 108 (90 Base) MCG/ACT inhaler Inhale 2 puffs into the lungs every 4 (four) hours as needed for wheezing or shortness of breath.  [name] alendronate (FOSAMAX) 70 MG tablet TAKE 1 TABLET BY MOUTH EVERY 7 DAYS WITH A FULL  GLASS OF WATER ON AN EMPTY STOMACH. (Patient taking differently: Take 70 mg by mouth once a week. )  . ascorbic acid (VITAMIN C) 500 MG tablet Take 1 tablet (500 mg total) by mouth daily.  [name] atorvastatin (LIPITOR) 40 MG tablet Take 40 mg by mouth daily.  . busPIRone (BUSPAR) 10 MG tablet TAKE 2 TABLETS (20 MG TOTAL) BY MOUTH 3 (THREE) TIMES DAILY.  [name] clopidogrel (PLAVIX) 75 MG tablet TAKE 1 TABLET DAILY  . ferrous sulfate 325 (65 FE) MG tablet TAKE 1 TABLET BY MOUTH 3 TIMES DAILY WITH MEALS. (Patient taking differently: Take 325 mg by mouth 3 (three) times daily with meals. )  . FLUoxetine (PROZAC) 20 MG capsule TAKE 1 CAPSULE DAILY. TAKE WITH 40MG  TO EQUAL 60MG     ONCE DAILY (Patient taking differently: Take 20 mg by mouth daily. TAKE 1 CAPSULE DAILY. TAKE WITH 40MG  TO EQUAL 60MG     ONCE DAILY)  . FLUoxetine (PROZAC) 40 MG capsule  Take 1 capsule (40 mg total) by mouth every morning.  . furosemide (LASIX) 20 MG tablet Take 1 tablet (20 mg total) by mouth as needed (As needed for ankle swelling).  . gabapentin (NEURONTIN) 100 MG capsule Take 2 capsules (200 mg total) by mouth 2 (two) times daily.  [name] levothyroxine (SYNTHROID) 75 MCG tablet TAKE 1 TABLET (75 MCG TOTAL) BY MOUTH DAILY BEFORE BREAKFAST.  . midodrine (PROAMATINE) 10 MG tablet Take 1 tablet (10 mg total) by mouth 3 (three) times daily.  . mirtazapine (REMERON) 15 MG tablet TAKE 1 TABLET BY MOUTH EVERYDAY AT BEDTIME (Patient taking differently: Take 15 mg by mouth at bedtime. )  . nitroGLYCERIN (NITROSTAT) 0.4 MG SL tablet Place 1 tablet (0.4 mg total) under the tongue every 5 (five) minutes as needed for chest pain.  . pantoprazole (PROTONIX) 40 MG tablet TAKE 1 TABLET BY MOUTH TWICE A DAY (Patient taking differently: Take 40 mg by mouth 2 (two) times daily. )  . tamsulosin (FLOMAX) 0.4 MG CAPS capsule Take 0.4 mg by mouth at bedtime.  . triamcinolone cream (KENALOG) 0.1 % APPLY 1 APPLICATION TOPICALLY 2 (TWO) TIMES DAILY. AVOID FACE, GROIN, UNDERARMS.  . vitamin B-12 (CYANOCOBALAMIN) 1000 MCG tablet Take 1,000 mcg by mouth daily.   [name] zinc sulfate 220 (50 Zn) MG capsule Take 1 capsule (220 mg total) by mouth daily.  [name] zolpidem (AMBIEN) 5 MG tablet Take 5 mg by mouth at bedtime.   . [DISCONTINUED] midodrine (PROAMATINE) 10 MG tablet TAKE ONE TABLET BY MOUTH THREE TIMES A DAY WITH BREAKFAST, LUNCH, AND DINNER. PLEASE CONTACT OFFICE TO SCHEDULE APPOINTMENT FOR FURTHER REFILLS.     Review of Systems  All other systems reviewed and are otherwise negative except as noted above.  Physical Exam    VS:  BP 96/64   Pulse 94   Ht 6' (1.829 m)   Wt 160 lb 3.2 oz (72.7 kg)   BMI 21.73 kg/m  , BMI Body mass index is 21.73 kg/m.  Wt Readings from Last 3 Encounters:  01/23/20 160 lb 3.2 oz (72.7 kg)  01/06/20 169 lb (76.7 kg)  01/04/20 169 lb (76.7 kg)    GEN: Well  nourished, well developed, in no acute distress. HEENT: normal. Neck: Supple, no JVD, carotid bruits, or masses. Cardiac: RRR, no murmurs, rubs, or gallops. No clubbing, cyanosis, edema.  Radials/DP/PT 2+ and equal bilaterally.  Respiratory:  Respirations regular and unlabored. RUL with inspiratory crackles. Bilateral lower lobes diminished.  GI: Soft, nontender, nondistended. MS:  No deformity or atrophy. Skin: Warm and dry, no rash. Neuro:  Strength and sensation are intact. Psych: Normal affect.  Assessment & Plan    1. CAD - Stable with no anginal symptoms. No indication for ischemic evaluation at this time. GDMT includes PLavix, Atorvastatin. No beta blocker due to orthostatic hypotension.   2. OSA - s/p inspire device  3. Palpitations - s/p ILR with no acute findings. Check electrolytes today.   4. LE edema - Reports no LE edema. He has been taking Lasix 20mg  daily. Due to hypotension and concern for dehydration, reduce to three times per week. May be able to further reduce to as-needed.  5. Dyspnea/COVID19 - RUL with crackles. Still using 4L O2 with ambulation and none at rest. Encouraged to try 3L with ambulation and monitor oxygen saturation. Higher concentration of oxygen could be contributory to lightheadedness. Upcoming follow up with PCP this afternoon. Just stopped prednisone today. Report productive cough though no longer requiring Muccinex. We did discuss possible echo for monitoring of heart function after COVID - as his dyspnea is improving and he is without orthopnea, edema, PND we have agreed to defer.   6. HLD, LDL goal <70 - Continue Atorvastatin 40mg  daily. Not fasting today, will defer repeat lipid panel.  7. Orthostatic hypotension - Reports intermittent orthostasis. Reduce Lasix, as above. Encouraged increased PO fluid intake. Refill provided of Midodrine TID. Will defer adding Florinef at this time as symptoms are intermittent, favor conservative management with  compression stockings, PO fluid.   8. Hypothyroidism - Continue to follow with PCP. Endorses compliance with levothyroxine 75mg  daily. Thyroid panel today.   9. CKD IIIA - Careful titration of diuretic and antihypertensive. CMP today.   10. Iron deficiency anemia - Continue iron supplement. Denies hematuria, melena. Cbc today for monitoring.   Disposition: Follow up in 3 month(s) with Dr. Rockey Situ or APP  Signed, Loel Dubonnet, NP 01/23/2020, 1:14 PM [place]

## 2020-01-23 NOTE — Progress Notes (Signed)
VIRTUAL VISIT Due to national recommendations of social distancing due to [place] 19, a virtual visit is felt to be most appropriate for this patient at this time.   I connected with the patient on 01/23/20 at  2:40 PM EDT by virtual telehealth platform and verified that I am speaking with the correct person using two identifiers.   I discussed the limitations, risks, security and privacy concerns of performing an evaluation and management service by  virtual telehealth platform and the availability of in person appointments. I also discussed with the patient that there may be a patient responsible charge related to this service. The patient expressed understanding and agreed to proceed.  Patient location: Home Provider Location: [place] Participants: [name] and [name]   Chief Complaint  Patient presents with  . Follow-up    Pt stated--feeling much better but still chest pain when takin deep breath.    History of Present Illness:   69 year old male with complicated PMH presents for hospital follow up for COVID19 PNA and hypoxia.   Admitted 9/27/ to 01/08/2020   Copied hospital course below for informational purposes. Hospital course:  Acute hypoxic respiratory failure secondary to acute Covid-19 viral pneumonia during the ongoing 2020/2021 Covid 19 Pandemic - POA Sepsis, present on admission Patient presenting to the ED with progressive shortness of breath, with recent diagnosis of Covid-19.  Patient was noted to have hypoxia on rest with SPO2 89%.  Patient was tachycardic with HR 100 and tachypneic with RR 29.  Chest x-ray consistent with multifocal pneumonia.  Patient was started on treatment with remdesivir and IV steroids.  Patient completed 3/5 days of IV remdesivir and patient will continue treatment in the outpatient infusion center to complete 5-day course.  Patient also will continue steroids with prednisone taper on discharge.  Continue zinc/vitamin C.   Albuterol MDI as needed for wheezing/shortness of breath.  Patient with continued hypoxia especially with exertion, and will discharge on 3 L nasal cannula.  Patient given home isolation precautions on discharge.  CKD stage IIIa Stable, creatinine 1.39 at time of discharge.  Recommend follow-up BMP/CMP at next PCP visit.  Iron deficiency anemia: Continue ferrous sulfate 325 mg p.o. 3 times daily  CAD Continue atorvastatin 4 mg p.o. daily, clopidogrel 75 mg p.o. daily  Hyperlipidemia: Continue atorvastatin 40 mg p.o. daily  Hypokalemia Repleted during hospitalization.  Potassium 5.2 at time of discharge.  Recommend repeat BMP in the next PCP visit.  GERD: Continue Protonix 40 mg p.o. twice daily  Anxiety/depression: Continue fluoxetine 60 mg p.o. daily  Orthostatic hypotension: Continue midodrine  01/23/20    He reports he is feeling better overal. Still some CP with deep breaths.  No fever. Minimal cough. Energy low... feeling tired.  Completed Remdesivir 5 day course.  Has prednisone course to complete today.   Labs today at Cardiology  showed normalized potassium at 4.6. Normal magnesium Hemoglobin normal at 14.4 on iron, wbc could be elevated due to prednisone.  LFTs normalized.  Cardiology recommended decreasing lasix to 3 times per week.   He has noted issue with urine flow, dribbling.. ongoing for 1 month or more. Driving him crazy.. frequent urination, nocturia, cannot empty compeltely. He has been on flomax for years.. not helping any more No burning with urination, no blood in urine.  NO low abdominal pain. No recent catheterization.  Normal PSA 02/2019  hx of kidney stone... followed by Dr, Rose Phi  COVID 19 screen No recent travel  or known exposure to COVID19 The patient denies respiratory symptoms of COVID 19 at this time.  The importance of social distancing was discussed today.   Review of Systems  Constitutional: Positive for malaise/fatigue.  Negative for chills and fever.  HENT: Negative for congestion and ear pain.   Eyes: Negative for pain and redness.  Respiratory: Positive for shortness of breath. Negative for cough.   Cardiovascular: Positive for chest pain. Negative for palpitations and leg swelling.  Gastrointestinal: Negative for abdominal pain, blood in stool, constipation, diarrhea, nausea and vomiting.  Genitourinary: Negative for dysuria.  Musculoskeletal: Negative for falls and myalgias.  Skin: Negative for rash.  Neurological: Negative for dizziness.  Psychiatric/Behavioral: Negative for depression. The patient is not nervous/anxious.       Past Medical History:  Diagnosis Date  . Anemia   . Anxiety   . Asymptomatic LV dysfunction    50-55% by echo 06/2016  . Berger's disease   . CAD (coronary artery disease) 06/2005   PCI of LAD and RCA  . Depression   . Dilated aortic root (HCC)    4cm at sinus of Valsalva  . Hematuria   . Hyperlipidemia   . Hypothyroidism   . Insomnia   . Left ureteral stone   . Orthostatic hypotension    negative tilt table  . OSA (obstructive sleep apnea)    moderate with AHI 17/hr, uses CPAP nightly(01/15/19 - has Inspire implant)  . PVC's (premature ventricular contractions) 07/07/2016  . Renal disorder    PT IS SEEING DR. Lorrene Reid- NEPHROLOGIST  . Rosacea   . Urticaria     reports that he has never smoked. He has never used smokeless tobacco. He reports current alcohol use of about 1.0 standard drink of alcohol per week. He reports that he does not use drugs.   Current Outpatient Medications:  .  albuterol (VENTOLIN HFA) 108 (90 Base) MCG/ACT inhaler, Inhale 2 puffs into the lungs every 4 (four) hours as needed for wheezing or shortness of breath., Disp: 1 each, Rfl: 0 .  alendronate (FOSAMAX) 70 MG tablet, TAKE 1 TABLET BY MOUTH EVERY 7 DAYS WITH A FULL GLASS OF WATER ON AN EMPTY STOMACH. (Patient taking differently: Take 70 mg by mouth once a week. ), Disp: 12 tablet, Rfl: 3  .  ascorbic acid (VITAMIN C) 500 MG tablet, Take 1 tablet (500 mg total) by mouth daily., Disp: 30 tablet, Rfl: 0 .  atorvastatin (LIPITOR) 40 MG tablet, Take 40 mg by mouth daily., Disp: , Rfl:  .  busPIRone (BUSPAR) 10 MG tablet, TAKE 2 TABLETS (20 MG TOTAL) BY MOUTH 3 (THREE) TIMES DAILY., Disp: 540 tablet, Rfl: 0 .  clopidogrel (PLAVIX) 75 MG tablet, TAKE 1 TABLET DAILY, Disp: 90 tablet, Rfl: 2 .  ferrous sulfate 325 (65 FE) MG tablet, TAKE 1 TABLET BY MOUTH 3 TIMES DAILY WITH MEALS. (Patient taking differently: Take 325 mg by mouth 3 (three) times daily with meals. ), Disp: 270 tablet, Rfl: 3 .  FLUoxetine (PROZAC) 20 MG capsule, TAKE 1 CAPSULE DAILY. TAKE WITH 40MG  TO EQUAL 60MG     ONCE DAILY (Patient taking differently: Take 20 mg by mouth daily. TAKE 1 CAPSULE DAILY. TAKE WITH 40MG  TO EQUAL 60MG     ONCE DAILY), Disp: 90 capsule, Rfl: 1 .  FLUoxetine (PROZAC) 40 MG capsule, Take 1 capsule (40 mg total) by mouth every morning., Disp: 90 capsule, Rfl: 1 .  furosemide (LASIX) 20 MG tablet, Take 1 tablet (20 mg total) by  mouth as needed (As needed for ankle swelling)., Disp: 90 tablet, Rfl: 3 .  gabapentin (NEURONTIN) 100 MG capsule, Take 2 capsules (200 mg total) by mouth 2 (two) times daily., Disp: 120 capsule, Rfl: 1 .  levothyroxine (SYNTHROID) 75 MCG tablet, TAKE 1 TABLET (75 MCG TOTAL) BY MOUTH DAILY BEFORE BREAKFAST., Disp: 90 tablet, Rfl: 1 .  midodrine (PROAMATINE) 10 MG tablet, Take 1 tablet (10 mg total) by mouth 3 (three) times daily., Disp: 270 tablet, Rfl: 3 .  mirtazapine (REMERON) 15 MG tablet, TAKE 1 TABLET BY MOUTH EVERYDAY AT BEDTIME (Patient taking differently: Take 15 mg by mouth at bedtime. ), Disp: 90 tablet, Rfl: 1 .  nitroGLYCERIN (NITROSTAT) 0.4 MG SL tablet, Place 1 tablet (0.4 mg total) under the tongue every 5 (five) minutes as needed for chest pain., Disp: 10 tablet, Rfl: 1 .  pantoprazole (PROTONIX) 40 MG tablet, TAKE 1 TABLET BY MOUTH TWICE A DAY (Patient taking  differently: Take 40 mg by mouth 2 (two) times daily. ), Disp: 180 tablet, Rfl: 1 .  tamsulosin (FLOMAX) 0.4 MG CAPS capsule, Take 0.4 mg by mouth at bedtime., Disp: , Rfl:  .  triamcinolone cream (KENALOG) 0.1 %, APPLY 1 APPLICATION TOPICALLY 2 (TWO) TIMES DAILY. AVOID FACE, GROIN, UNDERARMS., Disp: 454 g, Rfl: 0 .  vitamin B-12 (CYANOCOBALAMIN) 1000 MCG tablet, Take 1,000 mcg by mouth daily. , Disp: , Rfl:  .  zinc sulfate 220 (50 Zn) MG capsule, Take 1 capsule (220 mg total) by mouth daily., Disp: 30 capsule, Rfl: 0 .  zolpidem (AMBIEN) 5 MG tablet, Take 5 mg by mouth at bedtime. , Disp: , Rfl:    Observations/Objective: Blood pressure 95/60, height 6' (1.829 m), weight 175 lb (79.4 kg).  Physical Exam  Physical Exam Constitutional:      General: The patient is not in acute distress. Pulmonary:     Effort: Pulmonary effort is normal. No respiratory distress.  Neurological:     Mental Status: The patient is alert and oriented to person, place, and time.  Psychiatric:        Mood and Affect: Mood normal.        Behavior: Behavior normal.   Assessment and Plan Anemia Resolved.  Chest pain S/P COVID. Gradually improving with time. No red flags.  Pneumonia due to COVID-19 virus Completed remdesivir and steroids.  No longer with oxygen requirement.   Urinary dribbling Increase flomax to 0.8 mg.. follow BP  VERY CLOSELY and stop if causing more hypotension.     I discussed the assessment and treatment plan with the patient. The patient was provided an opportunity to ask questions and all were answered. The patient agreed with the plan and demonstrated an understanding of the instructions.   The patient was advised to call back or seek an in-person evaluation if the symptoms worsen or if the condition fails to improve as anticipated.     [name], MD

## 2020-01-23 NOTE — Patient Instructions (Addendum)
Increase flomax to 0.8 mg.. follow BP  VERY CLOSELY and stop if causing more hypotension.  Call if buring with urination or blood in urine.

## 2020-01-24 LAB — THYROID PANEL WITH TSH
Free Thyroxine Index: 2 (ref 1.2–4.9)
T3 Uptake Ratio: 32 % (ref 24–39)
T4, Total: 6.4 ug/dL (ref 4.5–12.0)
TSH: 2 u[IU]/mL (ref 0.450–4.500)

## 2020-01-28 ENCOUNTER — Ambulatory Visit: Payer: Medicare Other | Admitting: Family Medicine

## 2020-01-29 ENCOUNTER — Ambulatory Visit (INDEPENDENT_AMBULATORY_CARE_PROVIDER_SITE_OTHER): Payer: Medicare Other

## 2020-01-29 DIAGNOSIS — R002 Palpitations: Secondary | ICD-10-CM | POA: Diagnosis not present

## 2020-01-29 LAB — CUP PACEART REMOTE DEVICE CHECK
Date Time Interrogation Session: 20211020022317
Implantable Pulse Generator Implant Date: 20180202

## 2020-01-30 ENCOUNTER — Other Ambulatory Visit: Payer: Self-pay

## 2020-01-30 ENCOUNTER — Ambulatory Visit (INDEPENDENT_AMBULATORY_CARE_PROVIDER_SITE_OTHER): Payer: Medicare Other | Admitting: Dermatology

## 2020-01-30 DIAGNOSIS — L2089 Other atopic dermatitis: Secondary | ICD-10-CM

## 2020-01-30 DIAGNOSIS — L820 Inflamed seborrheic keratosis: Secondary | ICD-10-CM | POA: Diagnosis not present

## 2020-01-30 DIAGNOSIS — L570 Actinic keratosis: Secondary | ICD-10-CM

## 2020-01-30 DIAGNOSIS — L578 Other skin changes due to chronic exposure to nonionizing radiation: Secondary | ICD-10-CM | POA: Diagnosis not present

## 2020-01-30 DIAGNOSIS — L821 Other seborrheic keratosis: Secondary | ICD-10-CM | POA: Diagnosis not present

## 2020-01-30 DIAGNOSIS — I25118 Atherosclerotic heart disease of native coronary artery with other forms of angina pectoris: Secondary | ICD-10-CM | POA: Diagnosis not present

## 2020-01-30 MED ORDER — EUCRISA 2 % EX OINT: TOPICAL_OINTMENT | CUTANEOUS | 2 refills | Status: DC

## 2020-01-30 NOTE — Patient Instructions (Signed)
Cryotherapy Aftercare  . Wash gently with soap and water everyday.   . Apply Vaseline and Band-Aid daily until healed.  

## 2020-01-30 NOTE — Progress Notes (Signed)
   Follow-Up Visit   Subjective  Shawn Lam is a 69 y.o. male who presents for the following: Actinic Keratosis (6 months f/u hx of aks on the arms ) and Rash (check body for a rash that appeared 1 week ago, itching ). Pt was recently hospitalized with Covid 82  The following portions of the chart were reviewed this encounter and updated as appropriate:  Tobacco  Allergies  Meds  Problems  Med Hx  Surg Hx  Fam Hx     Review of Systems:  No other skin or systemic complaints except as noted in HPI or Assessment and Plan.  Objective  Well appearing patient in no apparent distress; mood and affect are within normal limits.  A focused examination was performed including face,arms,hands. Relevant physical exam findings are noted in the Assessment and Plan.  Objective  Left Antecubital Fossa: Pinkness on the antecubital areas   Objective  ears, face (19): Erythematous thin papules/macules with gritty scale.   Objective  hands (5): Erythematous keratotic or waxy stuck-on papule or plaque.    Assessment & Plan  Atopic dermatitis Antecubital Fossa Start Eucrisa ointment apply to skin qd-bid prn   If not covered try Protopic or Elidel   Ordered Medications: Crisaborole (EUCRISA) 2 % OINT  AK (actinic keratosis) (19) ears, face  Destruction of lesion - ears, face Complexity: simple   Destruction method: cryotherapy   Informed consent: discussed and consent obtained   Timeout:  patient name, date of birth, surgical site, and procedure verified Lesion destroyed using liquid nitrogen: Yes   Region frozen until ice ball extended beyond lesion: Yes   Outcome: patient tolerated procedure well with no complications   Post-procedure details: wound care instructions given    Inflamed seborrheic keratosis (5) hands  Destruction of lesion - hands Complexity: simple   Destruction method: cryotherapy   Informed consent: discussed and consent obtained   Timeout:  patient  name, date of birth, surgical site, and procedure verified Lesion destroyed using liquid nitrogen: Yes   Region frozen until ice ball extended beyond lesion: Yes   Outcome: patient tolerated procedure well with no complications   Post-procedure details: wound care instructions given    Actinic Damage - diffuse scaly erythematous macules with underlying dyspigmentation - Recommend daily broad spectrum sunscreen SPF 30+ to sun-exposed areas, reapply every 2 hours as needed.  - Call for new or changing lesions.  Seborrheic Keratoses - Stuck-on, waxy, tan-brown papules and plaques  - Discussed benign etiology and prognosis. - Observe - Call for any changes  Return in about 6 months (around 07/30/2020).  IMarye Round, CMA, am acting as scribe for Sarina Ser, MD .  Documentation: I have reviewed the above documentation for accuracy and completeness, and I agree with the above.  Sarina Ser, MD

## 2020-02-01 ENCOUNTER — Other Ambulatory Visit: Payer: Self-pay

## 2020-02-04 ENCOUNTER — Encounter: Payer: Self-pay | Admitting: Dermatology

## 2020-02-04 NOTE — Progress Notes (Signed)
[facility] Summary Report / Loop Recorder 

## 2020-02-07 ENCOUNTER — Emergency Department
Admission: EM | Admit: 2020-02-07 | Discharge: 2020-02-07 | Disposition: A | Discharge status: Home / Self Care | Payer: Medicare Other | Attending: Emergency Medicine | Admitting: Emergency Medicine

## 2020-02-07 ENCOUNTER — Telehealth: Payer: Self-pay

## 2020-02-07 ENCOUNTER — Encounter: Payer: Self-pay | Admitting: Emergency Medicine

## 2020-02-07 ENCOUNTER — Emergency Department: Payer: Medicare Other

## 2020-02-07 ENCOUNTER — Other Ambulatory Visit: Payer: Self-pay

## 2020-02-07 ENCOUNTER — Ambulatory Visit
Admission: EM | Admit: 2020-02-07 | Discharge: 2020-02-07 | Disposition: A | Discharge status: Home / Self Care | Payer: Medicare Other | Attending: Physician Assistant | Admitting: Physician Assistant

## 2020-02-07 DIAGNOSIS — J189 Pneumonia, unspecified organism: Secondary | ICD-10-CM | POA: Diagnosis not present

## 2020-02-07 DIAGNOSIS — U071 COVID-19: Secondary | ICD-10-CM | POA: Insufficient documentation

## 2020-02-07 DIAGNOSIS — Z7901 Long term (current) use of anticoagulants: Secondary | ICD-10-CM | POA: Diagnosis not present

## 2020-02-07 DIAGNOSIS — I519 Heart disease, unspecified: Secondary | ICD-10-CM | POA: Diagnosis not present

## 2020-02-07 DIAGNOSIS — R600 Localized edema: Secondary | ICD-10-CM | POA: Diagnosis not present

## 2020-02-07 DIAGNOSIS — Z955 Presence of coronary angioplasty implant and graft: Secondary | ICD-10-CM | POA: Diagnosis not present

## 2020-02-07 DIAGNOSIS — J90 Pleural effusion, not elsewhere classified: Secondary | ICD-10-CM | POA: Diagnosis not present

## 2020-02-07 DIAGNOSIS — R0600 Dyspnea, unspecified: Secondary | ICD-10-CM

## 2020-02-07 DIAGNOSIS — J9611 Chronic respiratory failure with hypoxia: Secondary | ICD-10-CM | POA: Diagnosis not present

## 2020-02-07 DIAGNOSIS — J40 Bronchitis, not specified as acute or chronic: Secondary | ICD-10-CM | POA: Diagnosis not present

## 2020-02-07 DIAGNOSIS — Z8616 Personal history of COVID-19: Secondary | ICD-10-CM | POA: Diagnosis not present

## 2020-02-07 DIAGNOSIS — E039 Hypothyroidism, unspecified: Secondary | ICD-10-CM | POA: Diagnosis not present

## 2020-02-07 DIAGNOSIS — I2510 Atherosclerotic heart disease of native coronary artery without angina pectoris: Secondary | ICD-10-CM | POA: Diagnosis not present

## 2020-02-07 DIAGNOSIS — Z79899 Other long term (current) drug therapy: Secondary | ICD-10-CM | POA: Diagnosis not present

## 2020-02-07 DIAGNOSIS — R0781 Pleurodynia: Secondary | ICD-10-CM | POA: Diagnosis not present

## 2020-02-07 DIAGNOSIS — R079 Chest pain, unspecified: Secondary | ICD-10-CM | POA: Diagnosis not present

## 2020-02-07 DIAGNOSIS — R0609 Other forms of dyspnea: Secondary | ICD-10-CM

## 2020-02-07 DIAGNOSIS — R0602 Shortness of breath: Secondary | ICD-10-CM | POA: Diagnosis not present

## 2020-02-07 DIAGNOSIS — N1831 Chronic kidney disease, stage 3a: Secondary | ICD-10-CM | POA: Diagnosis not present

## 2020-02-07 LAB — CBC
HCT: 40.3 % (ref 39.0–52.0)
Hemoglobin: 13.3 g/dL (ref 13.0–17.0)
MCH: 32.4 pg (ref 26.0–34.0)
MCHC: 33 g/dL (ref 30.0–36.0)
MCV: 98.3 fL (ref 80.0–100.0)
Platelets: 307 10*3/uL (ref 150–400)
RBC: 4.1 MIL/uL — ABNORMAL LOW (ref 4.22–5.81)
RDW: 14.5 % (ref 11.5–15.5)
WBC: 6.8 10*3/uL (ref 4.0–10.5)
nRBC: 0 % (ref 0.0–0.2)

## 2020-02-07 LAB — BASIC METABOLIC PANEL
Anion gap: 11 (ref 5–15)
BUN: 16 mg/dL (ref 8–23)
CO2: 26 mmol/L (ref 22–32)
Calcium: 8.8 mg/dL — ABNORMAL LOW (ref 8.9–10.3)
Chloride: 105 mmol/L (ref 98–111)
Creatinine, Ser: 1.46 mg/dL — ABNORMAL HIGH (ref 0.61–1.24)
GFR, Estimated: 52 mL/min — ABNORMAL LOW (ref >60)
Glucose, Bld: 101 mg/dL — ABNORMAL HIGH (ref 70–99)
Potassium: 4.3 mmol/L (ref 3.5–5.1)
Sodium: 142 mmol/L (ref 135–145)

## 2020-02-07 LAB — TROPONIN I (HIGH SENSITIVITY)
Troponin I (High Sensitivity): 4 ng/L (ref <18)
Troponin I (High Sensitivity): 4 ng/L (ref <18)

## 2020-02-07 LAB — FIBRIN DERIVATIVES D-DIMER (ARMC ONLY): Fibrin derivatives D-dimer (ARMC): 1832.32 ng/mL (FEU) — ABNORMAL HIGH (ref 0.00–499.00)

## 2020-02-07 LAB — PROTIME-INR
INR: 0.9 (ref 0.8–1.2)
Prothrombin Time: 11.3 seconds — ABNORMAL LOW (ref 11.4–15.2)

## 2020-02-07 LAB — PROCALCITONIN: Procalcitonin: <0.1 ng/mL

## 2020-02-07 LAB — BRAIN NATRIURETIC PEPTIDE: B Natriuretic Peptide: 29.9 pg/mL (ref 0.0–100.0)

## 2020-02-07 MED ORDER — IOHEXOL 350 MG/ML SOLN
75.0000 mL | Freq: Once | INTRAVENOUS | Status: AC | PRN
  Administered 2020-02-07: 75 mL via INTRAVENOUS

## 2020-02-07 NOTE — Discharge Instructions (Addendum)
You have been advised to follow up immediately in the emergency department for concerning signs.symptoms. If you declined EMS transport, please have a family member take you directly to the ED at this time. Do not delay. Based on concerns about condition, if you do not follow up in th e ED, you may risk poor outcomes including worsening of condition, delayed treatment and potentially life threatening issues. If you have declined to go to the ED at this time, you should call your PCP immediately to set up a follow up appointment.  GO TO [facility] ED AT THIS TIME  Go to ED for red flag symptoms, including; fevers you cannot reduce with Tylenol/Motrin, severe headaches, vision changes, numbness/weakness in part of the body, lethargy, confusion, intractable vomiting, severe dehydration, chest pain, breathing difficulty, severe persistent abdominal or pelvic pain, signs of severe infection (increased redness, swelling of an area), feeling faint or passing out, dizziness, etc. You should especially go to the ED for sudden acute worsening of condition if you do not elect to go at this time.

## 2020-02-07 NOTE — ED Triage Notes (Signed)
Pt here via POV from [facility]. Pt with c/o chest pain that is worse with inspiration today it is at it's worst. Has been on ongoing problem since 01/08/20. Pt on 4L [place] since d/c also. Pt reports increased SHOB.

## 2020-02-07 NOTE — ED Provider Notes (Signed)
[facility]    CSN: [id number] Arrival date & time: 02/07/20  1320      History   Chief Complaint Chief Complaint  Patient presents with  . Chest Pain  . Shortness of Breath    HPI Shawn Lam is a 69 y.o. male presenting for pleuritic chest pain off and on for the last month.  He states that he was released from the hospital after Covid treatment and diagnosis 1 month ago.  He states that he has been having chest pain/increased pain on breathing for the past few weeks.  Pain is increasingly worsening. Admits to left-sided chest pain as well w/o radiation.  Admits to dyspnea on exertion.  He says that when he gets up and walks his heart races and he is her to feel dizzy and like he is in a pass out.  Patient states that he has a history of coronary artery disease with multiple stent placements.  He states that he did see his cardiologist a couple weeks ago and was cleared of any heart problems due to Covid.  Patient denies any history of DVT or PE.  Patient has no fever, increased cough or congestion.  Patient denies any history of pulmonary disease.  He says he has been having to use 4 L of oxygen since diagnosis of Covid, but did not have to use any oxygen previous to that.  Overall, he admits to feeling generally worse since his diagnosis of Covid and since he has been out of the hospital for the past few weeks.  No other complaints or concerns today.  HPI  Past Medical History:  Diagnosis Date  . Anemia   . Anxiety   . Asymptomatic LV dysfunction    50-55% by echo 06/2016  . Berger's disease   . CAD (coronary artery disease) 06/2005   PCI of LAD and RCA  . Depression   . Dilated aortic root (HCC)    4cm at sinus of Valsalva  . Hematuria   . Hyperlipidemia   . Hypothyroidism   . Insomnia   . Left ureteral stone   . Orthostatic hypotension    negative tilt table  . OSA (obstructive sleep apnea)    moderate with AHI 17/hr, uses CPAP nightly(01/15/19 - has  Inspire implant)  . PVC's (premature ventricular contractions) 07/07/2016  . Renal disorder    PT IS SEEING DR. Lorrene Reid- NEPHROLOGIST  . Rosacea   . Urticaria     Patient Active Problem List   Diagnosis Date Noted  . Pneumonia due to COVID-19 virus 01/06/2020  . Acute respiratory failure with hypoxia ([place]) 01/06/2020  . Iron deficiency anemia 01/06/2020  . Rash 08/22/2019  . Allergic dermatitis 07/08/2019  . Night sweats 07/08/2019  . Osteoarthritis of left knee 02/26/2019  . Personal history of colonic polyps   . Postbulbar duodenal ulcer   . Chest pain 11/29/2018  . CKD (chronic kidney disease), stage IIIa   . Dark stools 11/09/2018  . Bilateral chronic knee pain 11/09/2018  . Chronic pain of right thumb 02/22/2018  . Acute bilateral thoracic back pain 01/23/2018  . Osteoporosis 02/10/2017  . Hypothyroidism 11/02/2016  . Thoracic compression fracture ([place]) 09/06/2016  . PVC's (premature ventricular contractions) 07/07/2016  . Hypoalbuminemia 06/28/2016  . IgA nephropathy 06/07/2016  . Palpitation 03/15/2016  . Mild depression ([place]) 11/20/2015  . OSA (obstructive sleep apnea) 02/05/2014  . Orthostatic hypotension 02/07/2013  . Anemia 01/31/2013  . Scotoma involving central area 01/29/2013  .  Optic neuropathy 01/28/2013  . Fatigue 01/20/2011  . HEMATURIA UNSPECIFIED 01/21/2010  . HLD (hyperlipidemia) 07/05/2006  . Generalized anxiety disorder 07/05/2006  . CAD (coronary artery disease) 07/05/2006  . GERD 07/05/2006    Past Surgical History:  Procedure Laterality Date  . CARDIAC CATHETERIZATION  09/06/2013   [facility]  . CARDIAC CATHETERIZATION    . COLONOSCOPY WITH PROPOFOL N/A 01/21/2019   Procedure: COLONOSCOPY WITH PROPOFOL;  Surgeon: Lucilla Lame, MD;  Location: [place];  Service: Endoscopy;  Laterality: N/A;  sleep apnea  . CORONARY ANGIOPLASTY  2004   STENT PLACEMENT  . CYSTOSCOPY WITH RETROGRADE PYELOGRAM, URETEROSCOPY AND STENT PLACEMENT Left  03/19/2014   Procedure: CYSTOSCOPY WITH RETROGRADE PYELOGRAM, URETEROSCOPY AND STENT PLACEMENT;  Surgeon: Junious Dresser, MD;  Location: [facility];  Service: Urology;  Laterality: Left;  . CYSTOSCOPY WITH RETROGRADE PYELOGRAM, URETEROSCOPY AND STENT PLACEMENT Bilateral 05/20/2015   Procedure:  CYSTOSCOPY WITH BILATERAL RETROGRADE PYELOGRAM,RIGHT  DIAGNOSTIC URETEROSCOPY ,LEFT URETEROSCOPY WITH HOLMIUM LASER  AND BILATERAL  STENT PLACEMENT ;  Surgeon: Alexis Frock, MD;  Location: [facility];  Service: Urology;  Laterality: Bilateral;  . DRUG INDUCED ENDOSCOPY N/A 10/20/2017   Procedure: DRUG INDUCED SLEEP ENDOSCOPY;  Surgeon: Melida Quitter, MD;  Location: [place];  Service: ENT;  Laterality: N/A;  . EP IMPLANTABLE DEVICE N/A 05/13/2016   Procedure: Loop Recorder Insertion;  Surgeon: Deboraha Sprang, MD;  Location: [place] [facility];  Service: Cardiovascular;  Laterality: N/A;  . ESOPHAGOGASTRODUODENOSCOPY (EGD) WITH PROPOFOL N/A 11/30/2018   Procedure: ESOPHAGOGASTRODUODENOSCOPY (EGD) WITH PROPOFOL;  Surgeon: Jonathon Bellows, MD;  Location: [facility] END[facility];  Service: Gastroenterology;  Laterality: N/A;  . ESOPHAGOGASTRODUODENOSCOPY (EGD) WITH PROPOFOL N/A 12/01/2018   Procedure: ESOPHAGOGASTRODUODENOSCOPY (EGD) WITH PROPOFOL;  Surgeon: Lucilla Lame, MD;  Location: [facility] ENDOSCOPY;  Service: Endoscopy;  Laterality: N/A;  . HOLMIUM LASER APPLICATION Bilateral 11/13/1322   Procedure: HOLMIUM LASER APPLICATION;  Surgeon: Alexis Frock, MD;  Location: [facility];  Service: Urology;  Laterality: Bilateral;  . IMPLIMENTATION OF A HYPOGLOSSAL NERVE STIMULATOR  12/08/2017   [facility]   . LEFT HEART CATHETERIZATION WITH CORONARY ANGIOGRAM N/A 09/06/2013   Procedure: LEFT HEART CATHETERIZATION WITH CORONARY ANGIOGRAM;  Surgeon: Sinclair Grooms, MD;  Location: [facility] CATH LAB;  Service: Cardiovascular;  Laterality: N/A;  . LEFT KNEE CAP  SURGERY ABOUT 40 YRS AGO  ? 1970    . STONE EXTRACTION WITH BASKET Left 03/19/2014     Procedure: STONE EXTRACTION WITH BASKET;  Surgeon: Junious Dresser, MD;  Location: [facility];  Service: Urology;  Laterality: Left;       Home Medications    Prior to Admission medications   Medication Sig Start Date End Date Taking? Authorizing Provider  albuterol (VENTOLIN HFA) 108 (90 Base) MCG/ACT inhaler Inhale 2 puffs into the lungs every 4 (four) hours as needed for wheezing or shortness of breath. 01/08/20  Yes [place] Eric J, DO  ascorbic acid (VITAMIN C) 500 MG tablet Take 1 tablet (500 mg total) by mouth daily. 01/09/20 02/08/20 Yes [place] Eric J, DO  atorvastatin (LIPITOR) 40 MG tablet Take 40 mg by mouth daily.   Yes [provider]  busPIRone (BUSPAR) 10 MG tablet TAKE 2 TABLETS (20 MG TOTAL) BY MOUTH 3 (THREE) TIMES DAILY. 12/19/19  Yes Bedsole, Amy E, MD  clopidogrel (PLAVIX) 75 MG tablet TAKE 1 TABLET DAILY 01/20/20  Yes Gollan, Kathlene November, MD  ferrous sulfate 325 (65 FE) MG tablet TAKE 1 TABLET BY  MOUTH 3 TIMES DAILY WITH MEALS. Patient taking differently: Take 325 mg by mouth 3 (three) times daily with meals.  02/19/19  Yes Bedsole, Amy E, MD  FLUoxetine (PROZAC) 20 MG capsule TAKE 1 CAPSULE DAILY. TAKE WITH 40MG  TO EQUAL 60MG     ONCE DAILY Patient taking differently: Take 20 mg by mouth daily. TAKE 1 CAPSULE DAILY. TAKE WITH 40MG  TO EQUAL 60MG     ONCE DAILY 10/07/19  Yes Bedsole, Amy E, MD  furosemide (LASIX) 20 MG tablet Take 1 tablet (20 mg total) by mouth as needed (As needed for ankle swelling). 07/16/19  Yes Gollan, Kathlene November, MD  gabapentin (NEURONTIN) 100 MG capsule Take 2 capsules (200 mg total) by mouth 2 (two) times daily. 01/01/20  Yes Copland, Frederico Hamman, MD  levothyroxine (SYNTHROID) 75 MCG tablet TAKE 1 TABLET (75 MCG TOTAL) BY MOUTH DAILY BEFORE BREAKFAST. 01/20/20  Yes Bedsole, Amy E, MD  midodrine (PROAMATINE) 10 MG tablet Take 1 tablet (10 mg total) by mouth 3 (three) times daily. 01/23/20  Yes Loel Dubonnet, NP  mirtazapine (REMERON) 15 MG tablet  TAKE 1 TABLET BY MOUTH EVERYDAY AT BEDTIME Patient taking differently: Take 15 mg by mouth at bedtime.  09/23/19  Yes Bedsole, Amy E, MD  pantoprazole (PROTONIX) 40 MG tablet TAKE 1 TABLET BY MOUTH TWICE A DAY Patient taking differently: Take 40 mg by mouth 2 (two) times daily.  08/21/19  Yes Bedsole, Amy E, MD  tamsulosin (FLOMAX) 0.4 MG CAPS capsule Take 0.4 mg by mouth at bedtime. 02/06/19  Yes [provider]  vitamin B-12 (CYANOCOBALAMIN) 1000 MCG tablet Take 1,000 mcg by mouth daily.    Yes [provider]  zinc sulfate 220 (50 Zn) MG capsule Take 1 capsule (220 mg total) by mouth daily. 01/09/20 02/08/20 Yes [place] Eric J, DO  zolpidem (AMBIEN) 5 MG tablet Take 5 mg by mouth at bedtime.  09/09/18  Yes [provider]  alendronate (FOSAMAX) 70 MG tablet TAKE 1 TABLET BY MOUTH EVERY 7 DAYS WITH A FULL GLASS OF WATER ON AN EMPTY STOMACH. Patient taking differently: Take 70 mg by mouth once a week.  12/17/19   Jinny Sanders, MD  Crisaborole (EUCRISA) 2 % OINT Apply to skin qd-bid 01/30/20   Ralene Bathe, MD  FLUoxetine (PROZAC) 40 MG capsule Take 1 capsule (40 mg total) by mouth every morning. 10/07/19   Bedsole, Amy E, MD  nitroGLYCERIN (NITROSTAT) 0.4 MG SL tablet Place 1 tablet (0.4 mg total) under the tongue every 5 (five) minutes as needed for chest pain. 03/30/18   Elby Beck, FNP  triamcinolone cream (KENALOG) 0.1 % APPLY 1 APPLICATION TOPICALLY 2 (TWO) TIMES DAILY. AVOID FACE, GROIN, UNDERARMS. 12/20/19   Jinny Sanders, MD    Family History Family History  Problem Relation Age of Onset  . Coronary artery disease Mother   . Heart attack Mother   . Heart disease Mother   . Hypertension Mother   . Cancer Father        lung and colon  . Lung cancer Father        smoker  . Diabetes Brother     Social History Social History   Tobacco Use  . Smoking status: Never Smoker  . Smokeless tobacco: Never Used  Vaping Use  . Vaping Use: Never used    Substance Use Topics  . Alcohol use: Yes    Alcohol/week: 1.0 standard drink    Types: 1 Cans of beer per  week    Comment: Occasional beer  . Drug use: No     Allergies   Codeine and Tape   Review of Systems Review of Systems  Constitutional: Positive for fatigue. Negative for fever.  HENT: Negative for congestion.   Respiratory: Positive for chest tightness and shortness of breath. Negative for cough and wheezing.   Cardiovascular: Positive for chest pain and palpitations. Negative for leg swelling.  Gastrointestinal: Negative for abdominal pain, nausea and vomiting.  Musculoskeletal: Negative for arthralgias and myalgias.  Neurological: Negative for dizziness, weakness, light-headedness and headaches.     Physical Exam Triage Vital Signs ED Triage Vitals  Enc Vitals Group     BP 02/07/20 1336 126/80     Pulse Rate 02/07/20 1336 82     Resp 02/07/20 1336 16     Temp 02/07/20 1336 97.7 F (36.5 C)     Temp Source 02/07/20 1336 Oral     SpO2 02/07/20 1336 97 %     Weight 02/07/20 1333 170 lb (77.1 kg)     Height 02/07/20 1333 6' (1.829 m)     Head Circumference --      Peak Flow --      Pain Score 02/07/20 1332 7     Pain Loc --      Pain Edu? --      Excl. in [place]? --    No data found.  Updated Vital Signs BP 126/80 (BP Location: Left Arm)   Pulse 82   Temp 97.7 F (36.5 C) (Oral)   Resp 16   Ht 6' (1.829 m)   Wt 170 lb (77.1 kg)   SpO2 97%   BMI 23.06 kg/m        Physical Exam Vitals and nursing note reviewed.  Constitutional:      General: He is not in acute distress.    Appearance: Normal appearance. He is well-developed and normal weight. He is not ill-appearing, toxic-appearing or diaphoretic.  HENT:     Head: Normocephalic and atraumatic.  Eyes:     General: No scleral icterus.    Conjunctiva/sclera: Conjunctivae normal.  Cardiovascular:     Rate and Rhythm: Normal rate and regular rhythm.     Pulses: Normal pulses.     Heart sounds:  Normal heart sounds. No murmur heard.   Pulmonary:     Effort: Pulmonary effort is normal. No respiratory distress.     Breath sounds: Normal breath sounds. No wheezing, rhonchi or rales.  Chest:     Chest wall: No tenderness.  Musculoskeletal:     Cervical back: Neck supple.     Right lower leg: No edema.     Left lower leg: No edema.  Skin:    General: Skin is warm and dry.  Neurological:     General: No focal deficit present.     Mental Status: He is alert and oriented to person, place, and time. Mental status is at baseline.     Motor: No weakness.     Gait: Gait normal.  Psychiatric:        Mood and Affect: Mood normal.        Behavior: Behavior normal.        Thought Content: Thought content normal.      UC Treatments / Results  Labs (all labs ordered are listed, but only abnormal results are displayed) Labs Reviewed - No data to display  EKG   Radiology No results found.  Procedures ED EKG  Date/Time: 02/07/2020 2:14 PM Performed by: Danton Clap, PA-C Authorized by: Danton Clap, PA-C   ECG reviewed by ED Physician in the absence of a cardiologist: yes   Previous ECG:    Previous ECG:  Unavailable Interpretation:    Interpretation: normal   Rate:    ECG rate:  81   ECG rate assessment: normal   Rhythm:    Rhythm: sinus rhythm   Ectopy:    Ectopy: none   QRS:    QRS axis:  Normal   QRS intervals:  Normal Conduction:    Conduction: normal   ST segments:    ST segments:  Normal T waves:    T waves: normal   Comments:     Normal sinus rhythm, regular rate   (including critical care time)  Medications Ordered in UC Medications - No data to display  Initial Impression / Assessment and Plan / UC Course  I have reviewed the triage vital signs and the nursing notes.  Pertinent labs & imaging results that were available during my care of the patient were reviewed by me and considered in my medical decision making (see chart for  details).   68 year old male with previous history of Covid who has been out of the hospital for the past month presenting for pleuritic pain worsening over the past month.  This is associated with increased chest pain and dyspnea on exertion.  He is not on any oxygen at this time and oxygen is 97%.  All other vital signs are stable and he is afebrile.  His heart has a regular rate and rhythm and chest is clear to auscultation.  An EKG was performed today which was within normal limits.  I did review his cardiology notes from a couple of weeks ago and saw that he did have a couple different labs placed, none of which were cardiac enzymes or D-dimer.  At this time, there is concerned that he could possibly be having an NSTEMI versus PE.  Those DDx is are of utmost concern.  Also explained to patient that it is possible he could have pneumonia or other infectious lung disease.  Other DDx include myocarditis, pericarditis, pancreatitis.  Patient's wife did bring him into the clinic today and I have advised that she take him to the emergency department this time.  He declines any EMS transport at this time.  He does leave in stable condition on his way to [facility] ED in [place].   Final Clinical Impressions(s) / UC Diagnoses   Final diagnoses:  Pleuritic chest pain  Dyspnea on exertion  Personal history of COVID-19  Cardiac disease     Discharge Instructions     You have been advised to follow up immediately in the emergency department for concerning signs.symptoms. If you declined EMS transport, please have a family member take you directly to the ED at this time. Do not delay. Based on concerns about condition, if you do not follow up in th e ED, you may risk poor outcomes including worsening of condition, delayed treatment and potentially life threatening issues. If you have declined to go to the ED at this time, you should call your PCP immediately to set up a follow up  appointment.  GO TO [facility] ED AT THIS TIME  Go to ED for red flag symptoms, including; fevers you cannot reduce with Tylenol/Motrin, severe headaches, vision changes, numbness/weakness in part of the body, lethargy, confusion, intractable vomiting, severe dehydration, chest  pain, breathing difficulty, severe persistent abdominal or pelvic pain, signs of severe infection (increased redness, swelling of an area), feeling faint or passing out, dizziness, etc. You should especially go to the ED for sudden acute worsening of condition if you do not elect to go at this time.    ED Prescriptions    None     PDMP not reviewed this encounter.   Danton Clap, PA-C 02/07/20 [phone]

## 2020-02-07 NOTE — ED Notes (Signed)
PT to CT.

## 2020-02-07 NOTE — ED Notes (Signed)
Patient is being discharged from the Urgent Care and sent to the Emergency Department via private Vehicle . Per Renita Papa, [place], patient is in need of higher level of care due to chest pain. Patient is aware and verbalizes understanding of plan of care.  Vitals:   02/07/20 1336  BP: 126/80  Pulse: 82  Resp: 16  Temp: 97.7 F (36.5 C)  SpO2: 97%

## 2020-02-07 NOTE — Telephone Encounter (Addendum)
I was unable to reach pt by phone and left v/m for pt to cb to discuss CP.  Unable to reach pts wife on phone.FYI to r Arihanna Estabrook LPN. [place] office mgr brought note that pt had scheduled in office appt on 02/11/20 with Dr Diona Browner for CP. Pt called back and pt got covid while in hospital. Already had fu visit with Dr Diona Browner. When pt breathes deep has chest pain and when pt just stands up and starts to walk has CP and SOB. Pt is presently SOB and has just gotten up and has SOB. Pt is sitting now and SOB is better. Earlier this morning pulse ox read oxygen level at 85%;pt put oxygen back on at 2 L and pulse ox was 94%. Pt has a device that can ck an EKG which has appeared normal. Pt has prod cough with green phlegm on and off. Pt said also had pneumonia when was in hospital with covid. Pt said finished abx and cannot tell much difference in how he feels since finishing abx. Pt is going to [facility] in [place] for eval now and will still keep appt with Dr Diona Browner on 02/11/20 as UC FU.

## 2020-02-07 NOTE — ED Triage Notes (Signed)
Patient c/o chest pain and SOB that started getting worse since he was discharged from the hospital for [place] on 01/08/20.  Patient states that he is usually on 4 liters of oxygen at home.  Patient denies fevers.

## 2020-02-07 NOTE — ED Provider Notes (Signed)
[facility] Emergency Department Provider Note  ____________________________________________   First MD Initiated Contact with Patient 02/07/20 1737     (approximate)  I have reviewed the triage vital signs and the nursing notes.   HISTORY  Chief Complaint Chest Pain   HPI Shawn Lam is a 69 y.o. male with a past medical history of CAD status post cardiac stenting currently on DAPT therapy, HDL, hypothyroidism, orthostatic hypotension on midodrine, OSA, anemia, anxiety, and subacute hypoxic respiratory failure on 4 L since being discharged approximately 30 days ago after he was hospitalized for COVID-19 who presents for assessment of progressive shortness of breath and substernal bilateral pleuritic chest pain that he feels has been worsening since he was discharged about 30 days ago.  He is still on 4 L which he was discharged on.  He states he is also coughing but denies any blood in his sputum.  Denies any other acute symptoms including fevers, chills, headache, earache, sore throat, congestion, abdominal pain, nausea, vomiting, diarrhea, dysuria, rash, or extremity pain.  Denies tobacco abuse, EtOH abuse, illicit drug use.  No clear alleviating or aggravating factors aside from exertion which seems to make his shortness of breath and chest pain worse.         Past Medical History:  Diagnosis Date  . Anemia   . Anxiety   . Asymptomatic LV dysfunction    50-55% by echo 06/2016  . Berger's disease   . CAD (coronary artery disease) 06/2005   PCI of LAD and RCA  . Depression   . Dilated aortic root (HCC)    4cm at sinus of Valsalva  . Hematuria   . Hyperlipidemia   . Hypothyroidism   . Insomnia   . Left ureteral stone   . Orthostatic hypotension    negative tilt table  . OSA (obstructive sleep apnea)    moderate with AHI 17/hr, uses CPAP nightly(01/15/19 - has Inspire implant)  . PVC's (premature ventricular contractions) 07/07/2016  . Renal  disorder    PT IS SEEING DR. Lorrene Reid- NEPHROLOGIST  . Rosacea   . Urticaria     Patient Active Problem List   Diagnosis Date Noted  . Pneumonia due to COVID-19 virus 01/06/2020  . Acute respiratory failure with hypoxia ([place]) 01/06/2020  . Iron deficiency anemia 01/06/2020  . Rash 08/22/2019  . Allergic dermatitis 07/08/2019  . Night sweats 07/08/2019  . Osteoarthritis of left knee 02/26/2019  . Personal history of colonic polyps   . Postbulbar duodenal ulcer   . Chest pain 11/29/2018  . CKD (chronic kidney disease), stage IIIa   . Dark stools 11/09/2018  . Bilateral chronic knee pain 11/09/2018  . Chronic pain of right thumb 02/22/2018  . Acute bilateral thoracic back pain 01/23/2018  . Osteoporosis 02/10/2017  . Hypothyroidism 11/02/2016  . Thoracic compression fracture ([place]) 09/06/2016  . PVC's (premature ventricular contractions) 07/07/2016  . Hypoalbuminemia 06/28/2016  . IgA nephropathy 06/07/2016  . Palpitation 03/15/2016  . Mild depression ([place]) 11/20/2015  . OSA (obstructive sleep apnea) 02/05/2014  . Orthostatic hypotension 02/07/2013  . Anemia 01/31/2013  . Scotoma involving central area 01/29/2013  . Optic neuropathy 01/28/2013  . Fatigue 01/20/2011  . HEMATURIA UNSPECIFIED 01/21/2010  . HLD (hyperlipidemia) 07/05/2006  . Generalized anxiety disorder 07/05/2006  . CAD (coronary artery disease) 07/05/2006  . GERD 07/05/2006    Past Surgical History:  Procedure Laterality Date  . CARDIAC CATHETERIZATION  09/06/2013   [place]  . CARDIAC CATHETERIZATION    .  COLONOSCOPY WITH PROPOFOL N/A 01/21/2019   Procedure: COLONOSCOPY WITH PROPOFOL;  Surgeon: Lucilla Lame, MD;  Location: [place];  Service: Endoscopy;  Laterality: N/A;  sleep apnea  . CORONARY ANGIOPLASTY  2004   STENT PLACEMENT  . CYSTOSCOPY WITH RETROGRADE PYELOGRAM, URETEROSCOPY AND STENT PLACEMENT Left 03/19/2014   Procedure: CYSTOSCOPY WITH RETROGRADE PYELOGRAM, URETEROSCOPY AND STENT  PLACEMENT;  Surgeon: Junious Dresser, MD;  Location: [facility];  Service: Urology;  Laterality: Left;  . CYSTOSCOPY WITH RETROGRADE PYELOGRAM, URETEROSCOPY AND STENT PLACEMENT Bilateral 05/20/2015   Procedure:  CYSTOSCOPY WITH BILATERAL RETROGRADE PYELOGRAM,RIGHT  DIAGNOSTIC URETEROSCOPY ,LEFT URETEROSCOPY WITH HOLMIUM LASER  AND BILATERAL  STENT PLACEMENT ;  Surgeon: Alexis Frock, MD;  Location: [facility];  Service: Urology;  Laterality: Bilateral;  . DRUG INDUCED ENDOSCOPY N/A 10/20/2017   Procedure: DRUG INDUCED SLEEP ENDOSCOPY;  Surgeon: Melida Quitter, MD;  Location: [place];  Service: ENT;  Laterality: N/A;  . EP IMPLANTABLE DEVICE N/A 05/13/2016   Procedure: Loop Recorder Insertion;  Surgeon: Deboraha Sprang, MD;  Location: [facility];  Service: Cardiovascular;  Laterality: N/A;  . ESOPHAGOGASTRODUODENOSCOPY (EGD) WITH PROPOFOL N/A 11/30/2018   Procedure: ESOPHAGOGASTRODUODENOSCOPY (EGD) WITH PROPOFOL;  Surgeon: Jonathon Bellows, MD;  Location: [facility] ENDOSCOPY;  Service: Gastroenterology;  Laterality: N/A;  . ESOPHAGOGASTRODUODENOSCOPY (EGD) WITH PROPOFOL N/A 12/01/2018   Procedure: ESOPHAGOGASTRODUODENOSCOPY (EGD) WITH PROPOFOL;  Surgeon: Lucilla Lame, MD;  Location: [facility];  Service: Endoscopy;  Laterality: N/A;  . HOLMIUM LASER APPLICATION Bilateral 6/0/4540   Procedure: HOLMIUM LASER APPLICATION;  Surgeon: Alexis Frock, MD;  Location: [facility];  Service: Urology;  Laterality: Bilateral;  . IMPLIMENTATION OF A HYPOGLOSSAL NERVE STIMULATOR  12/08/2017   [facility]   . LEFT HEART CATHETERIZATION WITH CORONARY ANGIOGRAM N/A 09/06/2013   Procedure: LEFT HEART CATHETERIZATION WITH CORONARY ANGIOGRAM;  Surgeon: Sinclair Grooms, MD;  Location: [facility] CATH LAB;  Service: Cardiovascular;  Laterality: N/A;  . LEFT KNEE CAP  SURGERY ABOUT 40 YRS AGO  ? 1970    . STONE EXTRACTION WITH BASKET Left 03/19/2014   Procedure: STONE EXTRACTION WITH BASKET;  Surgeon: Junious Dresser, MD;   Location: [facility];  Service: Urology;  Laterality: Left;    Prior to Admission medications   Medication Sig Start Date End Date Taking? Authorizing Provider  albuterol (VENTOLIN HFA) 108 (90 Base) MCG/ACT inhaler Inhale 2 puffs into the lungs every 4 (four) hours as needed for wheezing or shortness of breath. 01/08/20   [place] Eric J, DO  alendronate (FOSAMAX) 70 MG tablet TAKE 1 TABLET BY MOUTH EVERY 7 DAYS WITH A FULL GLASS OF WATER ON AN EMPTY STOMACH. Patient taking differently: Take 70 mg by mouth once a week.  12/17/19   Bedsole, Amy E, MD  ascorbic acid (VITAMIN C) 500 MG tablet Take 1 tablet (500 mg total) by mouth daily. 01/09/20 02/08/20  [place] Donnamarie Poag, DO  atorvastatin (LIPITOR) 40 MG tablet Take 40 mg by mouth daily.    [provider]  busPIRone (BUSPAR) 10 MG tablet TAKE 2 TABLETS (20 MG TOTAL) BY MOUTH 3 (THREE) TIMES DAILY. 12/19/19   Bedsole, Amy E, MD  clopidogrel (PLAVIX) 75 MG tablet TAKE 1 TABLET DAILY 01/20/20   Gollan, Kathlene November, MD  Crisaborole (EUCRISA) 2 % OINT Apply to skin qd-bid 01/30/20   Ralene Bathe, MD  ferrous sulfate 325 (65 FE) MG tablet TAKE 1 TABLET BY MOUTH 3 TIMES DAILY WITH MEALS. Patient taking differently: Take  325 mg by mouth 3 (three) times daily with meals.  02/19/19   Bedsole, Amy E, MD  FLUoxetine (PROZAC) 20 MG capsule TAKE 1 CAPSULE DAILY. TAKE WITH 40MG  TO EQUAL 60MG     ONCE DAILY Patient taking differently: Take 20 mg by mouth daily. TAKE 1 CAPSULE DAILY. TAKE WITH 40MG  TO EQUAL 60MG     ONCE DAILY 10/07/19   Diona Browner, Amy E, MD  FLUoxetine (PROZAC) 40 MG capsule Take 1 capsule (40 mg total) by mouth every morning. 10/07/19   Bedsole, Amy E, MD  furosemide (LASIX) 20 MG tablet Take 1 tablet (20 mg total) by mouth as needed (As needed for ankle swelling). 07/16/19   Minna Merritts, MD  gabapentin (NEURONTIN) 100 MG capsule Take 2 capsules (200 mg total) by mouth 2 (two) times daily. 01/01/20   [place], Frederico Hamman, MD  levothyroxine  (SYNTHROID) 75 MCG tablet TAKE 1 TABLET (75 MCG TOTAL) BY MOUTH DAILY BEFORE BREAKFAST. 01/20/20   Bedsole, Amy E, MD  midodrine (PROAMATINE) 10 MG tablet Take 1 tablet (10 mg total) by mouth 3 (three) times daily. 01/23/20   Loel Dubonnet, NP  mirtazapine (REMERON) 15 MG tablet TAKE 1 TABLET BY MOUTH EVERYDAY AT BEDTIME Patient taking differently: Take 15 mg by mouth at bedtime.  09/23/19   Bedsole, Amy E, MD  nitroGLYCERIN (NITROSTAT) 0.4 MG SL tablet Place 1 tablet (0.4 mg total) under the tongue every 5 (five) minutes as needed for chest pain. 03/30/18   Elby Beck, FNP  pantoprazole (PROTONIX) 40 MG tablet TAKE 1 TABLET BY MOUTH TWICE A DAY Patient taking differently: Take 40 mg by mouth 2 (two) times daily.  08/21/19   Bedsole, Amy E, MD  tamsulosin (FLOMAX) 0.4 MG CAPS capsule Take 0.4 mg by mouth at bedtime. 02/06/19   [provider]  triamcinolone cream (KENALOG) 0.1 % APPLY 1 APPLICATION TOPICALLY 2 (TWO) TIMES DAILY. AVOID FACE, GROIN, UNDERARMS. 12/20/19   Bedsole, Amy E, MD  vitamin B-12 (CYANOCOBALAMIN) 1000 MCG tablet Take 1,000 mcg by mouth daily.     [provider]  zinc sulfate 220 (50 Zn) MG capsule Take 1 capsule (220 mg total) by mouth daily. 01/09/20 02/08/20  [place] Donnamarie Poag, DO  zolpidem (AMBIEN) 5 MG tablet Take 5 mg by mouth at bedtime.  09/09/18   [provider]    Allergies Codeine and Tape  Family History  Problem Relation Age of Onset  . Coronary artery disease Mother   . Heart attack Mother   . Heart disease Mother   . Hypertension Mother   . Cancer Father        lung and colon  . Lung cancer Father        smoker  . Diabetes Brother     Social History Social History   Tobacco Use  . Smoking status: Never Smoker  . Smokeless tobacco: Never Used  Vaping Use  . Vaping Use: Never used  Substance Use Topics  . Alcohol use: Yes    Alcohol/week: 1.0 standard drink    Types: 1 Cans of beer per week    Comment:  Occasional beer  . Drug use: No    Review of Systems  Review of Systems  Constitutional: Negative for chills and fever.  HENT: Negative for sore throat.   Eyes: Negative for pain.  Respiratory: Positive for cough, sputum production and shortness of breath. Negative for stridor.   Cardiovascular: Positive for chest pain.  Gastrointestinal: Negative for vomiting.  Genitourinary: Negative for dysuria.  Musculoskeletal: Negative for myalgias.  Skin: Negative for rash.  Neurological: Negative for seizures, loss of consciousness and headaches.  Psychiatric/Behavioral: Negative for suicidal ideas.  All other systems reviewed and are negative.     ____________________________________________   PHYSICAL EXAM:  VITAL SIGNS: ED Triage Vitals  Enc Vitals Group     BP 02/07/20 1436 130/73     Pulse Rate 02/07/20 1436 81     Resp 02/07/20 1436 (!) 22     Temp 02/07/20 1436 97.8 F (36.6 C)     Temp Source 02/07/20 1436 Oral     SpO2 02/07/20 1436 100 %     Weight 02/07/20 1437 170 lb (77.1 kg)     Height 02/07/20 1437 6' (1.829 m)     Head Circumference --      Peak Flow --      Pain Score 02/07/20 1437 7     Pain Loc --      Pain Edu? --      Excl. in [place]? --    Vitals:   02/07/20 1800 02/07/20 1830  BP: (!) 143/71 125/74  Pulse: 74 69  Resp: (!) 24 19  Temp:    SpO2: 100% 100%   Physical Exam Vitals and nursing note reviewed.  Constitutional:      Appearance: He is well-developed.  HENT:     Head: Normocephalic and atraumatic.     Right Ear: External ear normal.     Left Ear: External ear normal.     Nose: Nose normal.  Eyes:     Conjunctiva/sclera: Conjunctivae normal.  Cardiovascular:     Rate and Rhythm: Normal rate and regular rhythm.     Heart sounds: No murmur heard.   Pulmonary:     Effort: Pulmonary effort is normal. Tachypnea present. No respiratory distress.     Breath sounds: Examination of the right-middle field reveals rhonchi. Examination of the  left-middle field reveals rhonchi. Rhonchi present.  Abdominal:     Palpations: Abdomen is soft.     Tenderness: There is no abdominal tenderness.  Musculoskeletal:     Cervical back: Neck supple.     Right lower leg: Edema present.     Left lower leg: Edema present.  Skin:    General: Skin is warm and dry.     Capillary Refill: Capillary refill takes less than 2 seconds.  Neurological:     Mental Status: He is alert and oriented to person, place, and time.  Psychiatric:        Mood and Affect: Mood normal.      ____________________________________________   LABS (all labs ordered are listed, but only abnormal results are displayed)  Labs Reviewed  BASIC METABOLIC PANEL - Abnormal; Notable for the following components:      Result Value   Glucose, Bld 101 (*)    Creatinine, Ser 1.46 (*)    Calcium 8.8 (*)    GFR, Estimated 52 (*)    All other components within normal limits  CBC - Abnormal; Notable for the following components:   RBC 4.10 (*)    All other components within normal limits  PROTIME-INR - Abnormal; Notable for the following components:   Prothrombin Time 11.3 (*)    All other components within normal limits  FIBRIN DERIVATIVES D-DIMER ([facility] ONLY) - Abnormal; Notable for the following components:   Fibrin derivatives D-dimer [facility]) 7,902.40 (*)    All other components within normal limits  BRAIN  NATRIURETIC PEPTIDE  PROCALCITONIN  TROPONIN I (HIGH SENSITIVITY)  TROPONIN I (HIGH SENSITIVITY)   ____________________________________________  EKG  Sinus rhythm with normal axis, unremarkable intervals, no evidence of significant arrhythmia or acute ischemia. ____________________________________________  RADIOLOGY  ED MD interpretation: Bilateral patchy opacities consistent with pneumonia.  No significant effusion, overt edema, pneumothorax, widened mediastinum, or other clear acute intrathoracic process.  Official radiology report(s): DG Chest 2  View  Result Date: 02/07/2020 CLINICAL DATA:  Chest pain EXAM: CHEST - 2 VIEW COMPARISON:  01/06/2020 FINDINGS: Cardiac loop recorder unchanged. Heart size and vascularity normal. Negative for heart failure. Patchy bibasilar airspace disease with progression. Mild upper lobe patchy airspace disease. Possible pneumonia. No pleural effusion. Hypoglossal nerve stimulator on the right. One lead extends into the neck and the other lead into the right anterior chest. This is unchanged. Chronic fracture approximately T12 with cement. IMPRESSION: Progression of bilateral airspace disease. Probable pneumonia. Correlate with COVID-19 status. Electronically Signed   By: Franchot Gallo M.D.   On: 02/07/2020 15:11   CT Angio Chest PE W and/or Wo Contrast  Result Date: 02/07/2020 CLINICAL DATA:  Chest pain and shortness of breath started getting worse when discharged from the hospital for Covid-19 on 01/08/2020. Patient is oxygen dependent at home. EXAM: CT ANGIOGRAPHY CHEST WITH CONTRAST TECHNIQUE: Multidetector CT imaging of the chest was performed using the standard protocol during bolus administration of intravenous contrast. Multiplanar CT image reconstructions and MIPs were obtained to evaluate the vascular anatomy. CONTRAST:  79mL OMNIPAQUE IOHEXOL 350 MG/ML SOLN COMPARISON:  11/27/2025 chest x-ray 02/07/2020 FINDINGS: Cardiovascular: Heart size is normal. There is no pericardial effusion. Minimal atherosclerotic calcification of the coronary arteries. There is minimal atherosclerosis of the thoracic aorta not associated with aneurysm. Pulmonary arteries are well opacified by contrast bolus. There is no evidence for acute pulmonary embolus. Mediastinum/Nodes: The visualized portion of the thyroid gland has a normal appearance. Esophagus is unremarkable. No significant mediastinal, hilar, or axillary adenopathy. Lungs/Pleura: There are extensive, patchy areas of consolidation throughout the lungs bilaterally. These  are primarily peripheral and/or associated traction bronchiectasis especially in the RIGHT UPPER lobe and LEFT LOWER lobe. No suspicious pulmonary nodules. No pleural effusions or pulmonary edema. Upper Abdomen: The gallbladder is present.  No acute abnormality. Musculoskeletal: Chronic wedge compression fractures of T7, T8, T9, and T12 are stable since August of 2020. Review of the MIP images confirms the above findings. IMPRESSION: 1. Technically adequate exam showing no acute pulmonary embolus. 2. Extensive, patchy areas of consolidation throughout the lungs bilaterally, primarily peripheral and associated with traction bronchiectasis. Findings are consistent with infectious process and/or fibrosis. 3. Chronic wedge compression fractures of T7, T8, T9, and T12. 4. Aortic Atherosclerosis (ICD10-I70.0). Electronically Signed   By: Nolon Nations M.D.   On: 02/07/2020 19:54    ____________________________________________   PROCEDURES  Procedure(s) performed (including Critical Care):  .1-3 Lead EKG Interpretation Performed by: Lucrezia Starch, MD Authorized by: Lucrezia Starch, MD     Interpretation: normal     ECG rate assessment: normal     Rhythm: sinus rhythm     Ectopy: none     Conduction: normal       ____________________________________________   INITIAL IMPRESSION / ASSESSMENT AND PLAN / ED COURSE        Patient presents with [place] to history exam for assessment of worsening chest pain cough and shortness of breath after recently being discharged for COVID-19 pneumonia and hypoxic respiratory failure.  Patient is still on  4 L 30 days after being discharged.  Patient is tachypneic at 22 with otherwise stable vital signs on 4 L on arrival.  Exam as above remarkable for some coarse rales bilaterally and some bilateral extremity pain.  Differential includes but is not limited to PE, acute bacterial commune acquired pneumonia, ACS, arrhythmia, anemia, metabolic derangements,  myocarditis, pericarditis, and pleurisy.  Very low suspicion for GI pathology or dissection at this time.   Suspicion for ACS given reassuring EKG was started on elevated troponins obtained over 2 hours.  CTA obtained due to elevated D-dimer shows no evidence of PE does show evidence of bilateral pneumonia with chronic thoracic compression fractures and some aortic atherosclerosis.  Patient does not appear volume overloaded on exam, imaging, and BNP is 29.  Low suspicion for CHF.  Troponin is not consistent with myocarditis and overall presentation is more consistent with likely viral bronchitis superimposed on damage lungs from recent COVID-19 infection.  Low suspicion for acute bacterial infection given absence of fever, elevated white blood cell count and procalcitonin was less than 0.1.  Counseled patient on expected clinical management and recommendation for close outpatient PCP follow-up.  Given stable vital signs on baseline O2 with otherwise reassuring exam and work-up I feel patient is safe for discharge.  Discharged stable condition.  Strict return cautions advised and discussed.  ____________________________________________   FINAL CLINICAL IMPRESSION(S) / ED DIAGNOSES  Final diagnoses:  Bronchitis  Chronic respiratory failure with hypoxia (HCC)    Medications  iohexol (OMNIPAQUE) 350 MG/ML injection 75 mL (75 mLs Intravenous Contrast Given 02/07/20 1933)     ED Discharge Orders    None       Note:  This document was prepared using Dragon voice recognition software and may include unintentional dictation errors.   Lucrezia Starch, MD 02/07/20 2024

## 2020-02-07 NOTE — Telephone Encounter (Signed)
Noted  

## 2020-02-10 ENCOUNTER — Other Ambulatory Visit: Payer: Self-pay | Admitting: Family Medicine

## 2020-02-11 ENCOUNTER — Encounter: Payer: Self-pay | Admitting: Family Medicine

## 2020-02-11 ENCOUNTER — Other Ambulatory Visit: Payer: Self-pay

## 2020-02-11 ENCOUNTER — Ambulatory Visit (INDEPENDENT_AMBULATORY_CARE_PROVIDER_SITE_OTHER): Payer: Medicare Other | Admitting: Family Medicine

## 2020-02-11 VITALS — BP 100/60 | HR 73 | Temp 98.2°F | Ht 72.0 in | Wt 166.5 lb

## 2020-02-11 DIAGNOSIS — U071 COVID-19: Secondary | ICD-10-CM | POA: Diagnosis not present

## 2020-02-11 DIAGNOSIS — J1282 Pneumonia due to coronavirus disease 2019: Secondary | ICD-10-CM | POA: Diagnosis not present

## 2020-02-11 DIAGNOSIS — I2510 Atherosclerotic heart disease of native coronary artery without angina pectoris: Secondary | ICD-10-CM

## 2020-02-11 DIAGNOSIS — J9601 Acute respiratory failure with hypoxia: Secondary | ICD-10-CM

## 2020-02-11 NOTE — Progress Notes (Signed)
Chief Complaint  Patient presents with  . Follow-up    ED Visit    History of Present Illness: HPI  69 year old male with a past medical history of CAD status post cardiac stenting currently on DAPT therapy, HDL, hypothyroidism, orthostatic hypotension on midodrine, OSA, anemia, anxiety presents following  COVID 19 infection  with oxygen requirement 4L  30 days out from hospital discharge presented to ER on 02/07/2020 with contiued pleuritic chest pain and continued SOB. He reports today he has had pleuritic chest pain even in hospital 1 month ago.. but is gradually worsening.  SOB with exertion not worsening but not getting better.   S/P remdesivir, prednsione 21 days  EKG was unremarkable.Elevated d-dimer. Cycled troponin normal WUJ:[id number] of bilateral airspace disease. Probable pneumonia. Correlate with COVID-19 status Chest CT: 1. Technically adequate exam showing no acute pulmonary embolus. 2. Extensive, patchy areas of consolidation throughout the lungs bilaterally, primarily peripheral and associated with traction bronchiectasis. Findings are consistent with infectious process and/or fibrosis. 3. Chronic wedge compression fractures of T7, T8, T9, and T12. 4 BNP normal Dx with viral bronchitis wbc normal so low suspicion for bacteria infection.     Today he continues to have pain in left chest wall.  Sharp with deep breaths, pain present constantly.  No rash. Worse with lying flat. No peripheral edema.  He is using oxygen when up and doing things.[name] oxygen still dropping down at times to 85%.   Has been using albuterol prn... improves SOB temporarily.   This visit occurred during the SARS-CoV-2 public health emergency.  Safety protocols were in place, including screening questions prior to the visit, additional usage of staff PPE, and extensive cleaning of exam room while observing appropriate contact time as indicated for disinfecting solutions.   COVID 19 screen:  No  recent travel or known exposure to COVID19 The patient denies respiratory symptoms of COVID 19 at this time. The importance of social distancing was discussed today.     Review of Systems  Constitutional: Negative for chills and fever.  HENT: Negative for congestion and ear pain.   Eyes: Negative for pain and redness.  Respiratory: Positive for shortness of breath. Negative for cough.   Cardiovascular: Positive for chest pain. Negative for palpitations and leg swelling.  Gastrointestinal: Negative for abdominal pain, blood in stool, constipation, diarrhea, nausea and vomiting.  Genitourinary: Negative for dysuria.  Musculoskeletal: Negative for falls and myalgias.  Skin: Negative for rash.  Neurological: Negative for dizziness.  Psychiatric/Behavioral: Negative for depression. The patient is not nervous/anxious.       Past Medical History:  Diagnosis Date  . Anemia   . Anxiety   . Asymptomatic LV dysfunction    50-55% by echo 06/2016  . Berger's disease   . CAD (coronary artery disease) 06/2005   PCI of LAD and RCA  . Depression   . Dilated aortic root (HCC)    4cm at sinus of Valsalva  . Hematuria   . Hyperlipidemia   . Hypothyroidism   . Insomnia   . Left ureteral stone   . Orthostatic hypotension    negative tilt table  . OSA (obstructive sleep apnea)    moderate with AHI 17/hr, uses CPAP nightly(01/15/19 - has Inspire implant)  . PVC's (premature ventricular contractions) 07/07/2016  . Renal disorder    PT IS SEEING DR. Lorrene Reid- NEPHROLOGIST  . Rosacea   . Urticaria     reports that he has never smoked. He has never used smokeless  tobacco. He reports current alcohol use of about 1.0 standard drink of alcohol per week. He reports that he does not use drugs.   Current Outpatient Medications:  .  albuterol (VENTOLIN HFA) 108 (90 Base) MCG/ACT inhaler, Inhale 2 puffs into the lungs every 4 (four) hours as needed for wheezing or shortness of breath., Disp: 1 each, Rfl:  0 .  alendronate (FOSAMAX) 70 MG tablet, TAKE 1 TABLET BY MOUTH EVERY 7 DAYS WITH A FULL GLASS OF WATER ON AN EMPTY STOMACH., Disp: 12 tablet, Rfl: 3 .  atorvastatin (LIPITOR) 40 MG tablet, Take 40 mg by mouth daily., Disp: , Rfl:  .  busPIRone (BUSPAR) 10 MG tablet, TAKE 2 TABLETS (20 MG TOTAL) BY MOUTH 3 (THREE) TIMES DAILY., Disp: 540 tablet, Rfl: 0 .  clopidogrel (PLAVIX) 75 MG tablet, TAKE 1 TABLET DAILY, Disp: 90 tablet, Rfl: 2 .  Crisaborole (EUCRISA) 2 % OINT, Apply to skin qd-bid, Disp: 100 g, Rfl: 2 .  ferrous sulfate 325 (65 FE) MG tablet, TAKE 1 TABLET BY MOUTH 3 TIMES DAILY WITH MEALS. (Patient taking differently: Take 325 mg by mouth 3 (three) times daily with meals. ), Disp: 270 tablet, Rfl: 3 .  FLUoxetine (PROZAC) 20 MG capsule, TAKE 1 CAPSULE DAILY. TAKE WITH 40MG  TO EQUAL 60MG     ONCE DAILY (Patient taking differently: Take 20 mg by mouth daily. TAKE 1 CAPSULE DAILY. TAKE WITH 40MG  TO EQUAL 60MG     ONCE DAILY), Disp: 90 capsule, Rfl: 1 .  FLUoxetine (PROZAC) 40 MG capsule, Take 1 capsule (40 mg total) by mouth every morning., Disp: 90 capsule, Rfl: 1 .  furosemide (LASIX) 20 MG tablet, Take 1 tablet (20 mg total) by mouth as needed (As needed for ankle swelling)., Disp: 90 tablet, Rfl: 3 .  gabapentin (NEURONTIN) 100 MG capsule, Take 2 capsules (200 mg total) by mouth 2 (two) times daily., Disp: 120 capsule, Rfl: 1 .  levothyroxine (SYNTHROID) 75 MCG tablet, TAKE 1 TABLET (75 MCG TOTAL) BY MOUTH DAILY BEFORE BREAKFAST., Disp: 90 tablet, Rfl: 1 .  midodrine (PROAMATINE) 10 MG tablet, Take 1 tablet (10 mg total) by mouth 3 (three) times daily., Disp: 270 tablet, Rfl: 3 .  mirtazapine (REMERON) 15 MG tablet, TAKE 1 TABLET BY MOUTH EVERYDAY AT BEDTIME (Patient taking differently: Take 15 mg by mouth at bedtime. ), Disp: 90 tablet, Rfl: 1 .  nitroGLYCERIN (NITROSTAT) 0.4 MG SL tablet, Place 1 tablet (0.4 mg total) under the tongue every 5 (five) minutes as needed for chest pain., Disp: 10  tablet, Rfl: 1 .  pantoprazole (PROTONIX) 40 MG tablet, TAKE 1 TABLET BY MOUTH TWICE A DAY, Disp: 180 tablet, Rfl: 1 .  tamsulosin (FLOMAX) 0.4 MG CAPS capsule, Take 0.4 mg by mouth at bedtime., Disp: , Rfl:  .  triamcinolone cream (KENALOG) 0.1 %, APPLY 1 APPLICATION TOPICALLY 2 (TWO) TIMES DAILY. AVOID FACE, GROIN, UNDERARMS., Disp: 454 g, Rfl: 0 .  vitamin B-12 (CYANOCOBALAMIN) 1000 MCG tablet, Take 1,000 mcg by mouth daily. , Disp: , Rfl:  .  zolpidem (AMBIEN) 5 MG tablet, Take 5 mg by mouth at bedtime. , Disp: , Rfl:    Observations/Objective: Blood pressure 100/60, pulse 73, temperature 98.2 F (36.8 C), temperature source Temporal, height 6' (1.829 m), weight 166 lb 8 oz (75.5 kg), SpO2 92 % on RA in office today.[name]  Physical Exam Constitutional:      General: Vital signs are normal.     Appearance: He is well-developed and well-nourished.  HENT:     Head: Normocephalic.     Right Ear: Hearing normal.     Left Ear: Hearing normal.     Nose: Nose normal.     Mouth/Throat:     Mouth: Oropharynx is clear and moist and mucous membranes are normal.  Neck:     Thyroid: No thyroid mass or thyromegaly.     Vascular: No carotid bruit.     Trachea: Trachea normal.  Cardiovascular:     Rate and Rhythm: Normal rate and regular rhythm.     Pulses: Normal pulses.     Heart sounds: Heart sounds not distant. No murmur heard. No friction rub. No gallop.      Comments: No peripheral edema Pulmonary:     Effort: Pulmonary effort is normal. No respiratory distress.     Breath sounds: Normal breath sounds.  Skin:    General: Skin is warm, dry and intact.     Findings: No rash.  Psychiatric:        Mood and Affect: Mood and affect normal.        Speech: Speech normal.        Behavior: Behavior normal.        Thought Content: Thought content normal.      Assessment and Plan Problem List Items Addressed This Visit    Acute respiratory failure with hypoxia ([place])    Continued oxygen  requirement: He is using oxygen when up and doing things.[name] oxygen still dropping down at times to 85%.       Relevant Orders   Ambulatory referral to Pulmonology   Pneumonia due to COVID-19 virus - Primary    Not improving as expected. Given delayed improvement and co morbidities.[name] refer to pulmonary for further eval and recommendations.      Relevant Orders   Ambulatory referral to Pulmonology     Orders Placed This Encounter  Procedures  . Ambulatory referral to Pulmonology    Referral Priority:   Urgent    Referral Type:   Consultation    Referral Reason:   Specialty Services Required    Requested Specialty:   Pulmonary Disease    Number of Visits Requested:   1        [name], MD

## 2020-02-11 NOTE — Patient Instructions (Signed)
We will call with referral to pulmonary.

## 2020-02-13 ENCOUNTER — Encounter: Payer: Self-pay | Admitting: Pulmonary Disease

## 2020-02-13 ENCOUNTER — Other Ambulatory Visit: Payer: Self-pay

## 2020-02-13 ENCOUNTER — Ambulatory Visit (INDEPENDENT_AMBULATORY_CARE_PROVIDER_SITE_OTHER): Payer: Medicare Other | Admitting: Pulmonary Disease

## 2020-02-13 VITALS — BP 110/64 | HR 90 | Temp 97.9°F | Ht 72.0 in | Wt 166.4 lb

## 2020-02-13 DIAGNOSIS — J849 Interstitial pulmonary disease, unspecified: Secondary | ICD-10-CM

## 2020-02-13 DIAGNOSIS — R0600 Dyspnea, unspecified: Secondary | ICD-10-CM

## 2020-02-13 DIAGNOSIS — Z8616 Personal history of COVID-19: Secondary | ICD-10-CM | POA: Diagnosis not present

## 2020-02-13 DIAGNOSIS — R0609 Other forms of dyspnea: Secondary | ICD-10-CM

## 2020-02-13 DIAGNOSIS — R0602 Shortness of breath: Secondary | ICD-10-CM

## 2020-02-13 NOTE — Patient Instructions (Signed)
Thank you for visiting Dr. Valeta Harms at [facility]. Today we recommend the following:  Orders Placed This Encounter  Procedures  . CT CHEST HIGH RESOLUTION  . Pulmonary Function Test   Return in about 6 months (around 08/12/2020) for Dr. Valeta Harms .    Please do your part to reduce the spread of COVID-19.

## 2020-02-13 NOTE — Progress Notes (Signed)
Synopsis: Referred in November 2021 for post Covid abnormal CT by Jinny Sanders, MD  Subjective:   PATIENT ID: Shawn Lam GENDER: male DOB: 1951-03-14, MRN: [id number]  Chief Complaint  Patient presents with  . Consult    hx of covid---SHOB with exertion still.  using oxygen at home.     PMH CKD, HLD, HTN, CAD. Sept 2021 + [place] admitted to [facility].  This gentleman did receive both COVID-19 vaccinations.  Unfortunately still developed COVID-19, he received the [place] vaccinations.  He was admitted to the hospital maximum on 4 L nasal cannula.  He was treated with a long tapering dose of steroids and remdesivir.  Post hospitalization he still has some dyspnea on exertion.  He is athletic and attempts to walk on his treadmill or run as much as he can to stay active.  He is slowly been recovering but has seen a significant change in his ability and endurance level. Patient did have a noncontrasted CT of the chest completed in October 2021 which revealed bilateral patchy scarring and infiltrates consistent with his prior diagnosis of COVID-19.  There was no evidence of pulmonary embolism.   Past Medical History:  Diagnosis Date  . Anemia   . Anxiety   . Asymptomatic LV dysfunction    50-55% by echo 06/2016  . Berger's disease   . CAD (coronary artery disease) 06/2005   PCI of LAD and RCA  . Depression   . Dilated aortic root (HCC)    4cm at sinus of Valsalva  . Hematuria   . Hyperlipidemia   . Hypothyroidism   . Insomnia   . Left ureteral stone   . Orthostatic hypotension    negative tilt table  . OSA (obstructive sleep apnea)    moderate with AHI 17/hr, uses CPAP nightly(01/15/19 - has Inspire implant)  . PVC's (premature ventricular contractions) 07/07/2016  . Renal disorder    PT IS SEEING DR. Lorrene Reid- NEPHROLOGIST  . Rosacea   . Urticaria      Family History  Problem Relation Age of Onset  . Coronary artery disease Mother   . Heart attack Mother   . Heart disease  Mother   . Hypertension Mother   . Cancer Father        lung and colon  . Lung cancer Father        smoker  . Diabetes Brother      Past Surgical History:  Procedure Laterality Date  . CARDIAC CATHETERIZATION  09/06/2013   [place]  . CARDIAC CATHETERIZATION    . COLONOSCOPY WITH PROPOFOL N/A 01/21/2019   Procedure: COLONOSCOPY WITH PROPOFOL;  Surgeon: Lucilla Lame, MD;  Location: [place];  Service: Endoscopy;  Laterality: N/A;  sleep apnea  . CORONARY ANGIOPLASTY  2004   STENT PLACEMENT  . CYSTOSCOPY WITH RETROGRADE PYELOGRAM, URETEROSCOPY AND STENT PLACEMENT Left 03/19/2014   Procedure: CYSTOSCOPY WITH RETROGRADE PYELOGRAM, URETEROSCOPY AND STENT PLACEMENT;  Surgeon: Junious Dresser, MD;  Location: [facility];  Service: Urology;  Laterality: Left;  . CYSTOSCOPY WITH RETROGRADE PYELOGRAM, URETEROSCOPY AND STENT PLACEMENT Bilateral 05/20/2015   Procedure:  CYSTOSCOPY WITH BILATERAL RETROGRADE PYELOGRAM,RIGHT  DIAGNOSTIC URETEROSCOPY ,LEFT URETEROSCOPY WITH HOLMIUM LASER  AND BILATERAL  STENT PLACEMENT ;  Surgeon: Alexis Frock, MD;  Location: [facility];  Service: Urology;  Laterality: Bilateral;  . DRUG INDUCED ENDOSCOPY N/A 10/20/2017   Procedure: DRUG INDUCED SLEEP ENDOSCOPY;  Surgeon: Melida Quitter, MD;  Location: [place];  Service: ENT;  Laterality: N/A;  . EP IMPLANTABLE DEVICE N/A 05/13/2016   Procedure: Loop Recorder Insertion;  Surgeon: Deboraha Sprang, MD;  Location: [place] [facility];  Service: Cardiovascular;  Laterality: N/A;  . ESOPHAGOGASTRODUODENOSCOPY (EGD) WITH PROPOFOL N/A 11/30/2018   Procedure: ESOPHAGOGASTRODUODENOSCOPY (EGD) WITH PROPOFOL;  Surgeon: Jonathon Bellows, MD;  Location[facility] ENDOSCOPY;  Service: Gastroenterology;  Laterality: N/A;  . ESOPHAGOGASTRODUODENOSCOPY (EGD) WITH PROPOFOL N/A 12/01/2018   Procedure: ESOPHAGOGASTRODUODENOSCOPY (EGD) WITH PROPOFOL;  Surgeon: Lucilla Lame, MD;  Location: [facility];  Service: Endoscopy;  Laterality:  N/A;  . HOLMIUM LASER APPLICATION Bilateral 5/0/2774   Procedure: HOLMIUM LASER APPLICATION;  Surgeon: Alexis Frock, MD;  Location: [facility];  Service: Urology;  Laterality: Bilateral;  . IMPLIMENTATION OF A HYPOGLOSSAL NERVE STIMULATOR  12/08/2017   [facility]   . LEFT HEART CATHETERIZATION WITH CORONARY ANGIOGRAM N/A 09/06/2013   Procedure: LEFT HEART CATHETERIZATION WITH CORONARY ANGIOGRAM;  Surgeon: Sinclair Grooms, MD;  Location: [facility] CATH LAB;  Service: Cardiovascular;  Laterality: N/A;  . LEFT KNEE CAP  SURGERY ABOUT 40 YRS AGO  ? 1970    . STONE EXTRACTION WITH BASKET Left 03/19/2014   Procedure: STONE EXTRACTION WITH BASKET;  Surgeon: Junious Dresser, MD;  Location: [facility];  Service: Urology;  Laterality: Left;    Social History   Socioeconomic History  . Marital status: Married    Spouse name: Not on file  . Number of children: Not on file  . Years of education: Not on file  . Highest education level: Not on file  Occupational History  . Occupation: retired Print production planner  Tobacco Use  . Smoking status: Never Smoker  . Smokeless tobacco: Never Used  Vaping Use  . Vaping Use: Never used  Substance and Sexual Activity  . Alcohol use: Yes    Alcohol/week: 1.0 standard drink    Types: 1 Cans of beer per week    Comment: Occasional beer  . Drug use: No  . Sexual activity: Yes    Partners: Female    Birth control/protection: None  Other Topics Concern  . Not on file  Social History Narrative   Regular exercise--yes--jog 5 miles 6 days a week   Social Determinants of Health   Financial Resource Strain: Low Risk   . Difficulty of Paying Living Expenses: Not hard at all  Food Insecurity: No Food Insecurity  . Worried About Charity fundraiser in the Last Year: Never true  . Ran Out of Food in the Last Year: Never true  Transportation Needs: No Transportation Needs  . Lack of Transportation (Medical): No  . Lack of Transportation (Non-Medical): No  Physical Activity:  Inactive  . Days of Exercise per Week: 0 days  . Minutes of Exercise per Session: 0 min  Stress: No Stress Concern Present  . Feeling of Stress : Not at all  Social Connections:   . Frequency of Communication with Friends and Family: Not on file  . Frequency of Social Gatherings with Friends and Family: Not on file  . Attends Religious Services: Not on file  . Active Member of Clubs or Organizations: Not on file  . Attends Archivist Meetings: Not on file  . Marital Status: Not on file  Intimate Partner Violence: Not At Risk  . Fear of Current or Ex-Partner: No  . Emotionally Abused: No  . Physically Abused: No  . Sexually Abused: No     Allergies  Allergen Reactions  . Codeine Nausea Only  .  Tape Other (See Comments)    Use only paper tape. Severe blistering and bruising with other tapes. No cloth tape.     Outpatient Medications Prior to Visit  Medication Sig Dispense Refill  . albuterol (VENTOLIN HFA) 108 (90 Base) MCG/ACT inhaler Inhale 2 puffs into the lungs every 4 (four) hours as needed for wheezing or shortness of breath. 1 each 0  . alendronate (FOSAMAX) 70 MG tablet TAKE 1 TABLET BY MOUTH EVERY 7 DAYS WITH A FULL GLASS OF WATER ON AN EMPTY STOMACH. 12 tablet 3  . atorvastatin (LIPITOR) 40 MG tablet Take 40 mg by mouth daily.    . busPIRone (BUSPAR) 10 MG tablet TAKE 2 TABLETS (20 MG TOTAL) BY MOUTH 3 (THREE) TIMES DAILY. 540 tablet 0  . clopidogrel (PLAVIX) 75 MG tablet TAKE 1 TABLET DAILY 90 tablet 2  . Crisaborole (EUCRISA) 2 % OINT Apply to skin qd-bid 100 g 2  . ferrous sulfate 325 (65 FE) MG tablet TAKE 1 TABLET BY MOUTH 3 TIMES DAILY WITH MEALS. (Patient taking differently: Take 325 mg by mouth 3 (three) times daily with meals. ) 270 tablet 3  . FLUoxetine (PROZAC) 20 MG capsule TAKE 1 CAPSULE DAILY. TAKE WITH 40MG  TO EQUAL 60MG     ONCE DAILY (Patient taking differently: Take 20 mg by mouth daily. TAKE 1 CAPSULE DAILY. TAKE WITH 40MG  TO EQUAL 60MG     ONCE  DAILY) 90 capsule 1  . FLUoxetine (PROZAC) 40 MG capsule Take 1 capsule (40 mg total) by mouth every morning. 90 capsule 1  . furosemide (LASIX) 20 MG tablet Take 1 tablet (20 mg total) by mouth as needed (As needed for ankle swelling). 90 tablet 3  . gabapentin (NEURONTIN) 100 MG capsule Take 2 capsules (200 mg total) by mouth 2 (two) times daily. 120 capsule 1  . levothyroxine (SYNTHROID) 75 MCG tablet TAKE 1 TABLET (75 MCG TOTAL) BY MOUTH DAILY BEFORE BREAKFAST. 90 tablet 1  . midodrine (PROAMATINE) 10 MG tablet Take 1 tablet (10 mg total) by mouth 3 (three) times daily. 270 tablet 3  . mirtazapine (REMERON) 15 MG tablet TAKE 1 TABLET BY MOUTH EVERYDAY AT BEDTIME (Patient taking differently: Take 15 mg by mouth at bedtime. ) 90 tablet 1  . nitroGLYCERIN (NITROSTAT) 0.4 MG SL tablet Place 1 tablet (0.4 mg total) under the tongue every 5 (five) minutes as needed for chest pain. 10 tablet 1  . pantoprazole (PROTONIX) 40 MG tablet TAKE 1 TABLET BY MOUTH TWICE A DAY 180 tablet 1  . tamsulosin (FLOMAX) 0.4 MG CAPS capsule Take 0.4 mg by mouth at bedtime.    . triamcinolone cream (KENALOG) 0.1 % APPLY 1 APPLICATION TOPICALLY 2 (TWO) TIMES DAILY. AVOID FACE, GROIN, UNDERARMS. 454 g 0  . vitamin B-12 (CYANOCOBALAMIN) 1000 MCG tablet Take 1,000 mcg by mouth daily.     [name] zolpidem (AMBIEN) 5 MG tablet Take 5 mg by mouth at bedtime.      No facility-administered medications prior to visit.    Review of Systems  Constitutional: Negative for chills, fever, malaise/fatigue and weight loss.  HENT: Negative for hearing loss, sore throat and tinnitus.   Eyes: Negative for blurred vision and double vision.  Respiratory: Positive for shortness of breath. Negative for cough, hemoptysis, sputum production, wheezing and stridor.   Cardiovascular: Negative for chest pain, palpitations, orthopnea, leg swelling and PND.  Gastrointestinal: Negative for abdominal pain, constipation, diarrhea, heartburn, nausea and  vomiting.  Genitourinary: Negative for dysuria, hematuria and urgency.  Musculoskeletal: Negative for joint pain and myalgias.  Skin: Negative for itching and rash.  Neurological: Negative for dizziness, tingling, weakness and headaches.  Endo/Heme/Allergies: Negative for environmental allergies. Does not bruise/bleed easily.  Psychiatric/Behavioral: Negative for depression. The patient is not nervous/anxious and does not have insomnia.   All other systems reviewed and are negative.    Objective:  Physical Exam Vitals reviewed.  Constitutional:      General: He is not in acute distress.    Appearance: He is well-developed.  HENT:     Head: Normocephalic and atraumatic.  Eyes:     General: No scleral icterus.    Conjunctiva/sclera: Conjunctivae normal.     Pupils: Pupils are equal, round, and reactive to light.  Neck:     Vascular: No JVD.     Trachea: No tracheal deviation.  Cardiovascular:     Rate and Rhythm: Normal rate and regular rhythm.     Heart sounds: Normal heart sounds. No murmur heard.   Pulmonary:     Effort: Pulmonary effort is normal. No tachypnea, accessory muscle usage or respiratory distress.     Breath sounds: Normal breath sounds. No stridor. No wheezing, rhonchi or rales.     Comments: No crackles Abdominal:     General: Bowel sounds are normal. There is no distension.     Palpations: Abdomen is soft.     Tenderness: There is no abdominal tenderness.  Musculoskeletal:        General: No tenderness.     Cervical back: Neck supple.  Lymphadenopathy:     Cervical: No cervical adenopathy.  Skin:    General: Skin is warm and dry.     Capillary Refill: Capillary refill takes less than 2 seconds.     Findings: No rash.  Neurological:     Mental Status: He is alert and oriented to person, place, and time.  Psychiatric:        Behavior: Behavior normal.      Vitals:   02/13/20 1433  BP: 110/64  Pulse: 90  Temp: 97.9 F (36.6 C)  TempSrc:  Tympanic  SpO2: 97%  Weight: 166 lb 6 oz (75.5 kg)  Height: 6' (1.829 m)   97% on RA BMI Readings from Last 3 Encounters:  02/13/20 22.56 kg/m  02/11/20 22.58 kg/m  02/07/20 23.06 kg/m   Wt Readings from Last 3 Encounters:  02/13/20 166 lb 6 oz (75.5 kg)  02/11/20 166 lb 8 oz (75.5 kg)  02/07/20 170 lb (77.1 kg)     CBC    Component Value Date/Time   WBC 6.8 02/07/2020 1452   RBC 4.10 (L) 02/07/2020 1452   HGB 13.3 02/07/2020 1452   HGB 15.0 02/26/2019 0945   HCT 40.3 02/07/2020 1452   HCT 43.0 02/26/2019 0945   PLT 307 02/07/2020 1452   PLT 238 12/26/2018 0919   MCV 98.3 02/07/2020 1452   MCV 93 02/26/2019 0945   MCH 32.4 02/07/2020 1452   MCHC 33.0 02/07/2020 1452   RDW 14.5 02/07/2020 1452   RDW 12.8 02/26/2019 0945   LYMPHSABS 1.0 01/08/2020 0513   LYMPHSABS 1.0 12/26/2018 0919   MONOABS 0.5 01/08/2020 0513   EOSABS 0.0 01/08/2020 0513   EOSABS 0.3 12/26/2018 0919   BASOSABS 0.0 01/08/2020 0513   BASOSABS 0.1 12/26/2018 0919     Chest Imaging: October 2021 CT chest: Bilateral peripheral based patchy groundglass infiltrates concerning of recovering post Covid, postinflammatory ILD and scarring. The patient's images have been independently reviewed  by me.    Pulmonary Functions Testing Results: No flowsheet data found.  FeNO:   Pathology:   Echocardiogram:   Heart Catheterization:     Assessment & Plan:     ICD-10-CM   1. History of COVID-19  Z86.16 CT CHEST HIGH RESOLUTION    Pulmonary Function Test  2. Interstitial pulmonary disease (HCC)  J84.9 CT CHEST HIGH RESOLUTION    Pulmonary Function Test  3. DOE (dyspnea on exertion)  R06.00   4. SOB (shortness of breath)  R06.02     Discussion:  This is a 69 year old gentleman, prior vaccinated for COVID-19, Pfizer vaccine x2, unfortunately developed COVID-19 and was admitted to the hospital, had hypoxemic respiratory failure and bilateral pneumonia requiring nasal cannula O2  supplementation.  At this time review of the chest images does reveal postinflammatory changes.  With COVID-19 we do see this quite often on CT imaging and it does take several months to resolve.  Time today in the office dedicated to review of CT chest that was completed in October 2021.  Plan: I explained that treatment for this is conservative.  We would continue to watch him to see if he develops any postinflammatory fibrosis of the lung. I think the next best steps would be consideration of a noncontrasted CT scan of the chest in 6 months from today. We will also have full pulmonary function test completed at that time to see if his lung function has recovered to a normal baseline. Otherwise patient is to watch his symptoms and if he has any significant change he supposed to let us know. Patient was counseled on returning back to his activities of daily living and increasing his exercise tolerance and endurance.  Return to clinic in 6 months.   Current Outpatient Medications:  .  albuterol (VENTOLIN HFA) 108 (90 Base) MCG/ACT inhaler, Inhale 2 puffs into the lungs every 4 (four) hours as needed for wheezing or shortness of breath., Disp: 1 each, Rfl: 0 .  alendronate (FOSAMAX) 70 MG tablet, TAKE 1 TABLET BY MOUTH EVERY 7 DAYS WITH A FULL GLASS OF WATER ON AN EMPTY STOMACH., Disp: 12 tablet, Rfl: 3 .  atorvastatin (LIPITOR) 40 MG tablet, Take 40 mg by mouth daily., Disp: , Rfl:  .  busPIRone (BUSPAR) 10 MG tablet, TAKE 2 TABLETS (20 MG TOTAL) BY MOUTH 3 (THREE) TIMES DAILY., Disp: 540 tablet, Rfl: 0 .  clopidogrel (PLAVIX) 75 MG tablet, TAKE 1 TABLET DAILY, Disp: 90 tablet, Rfl: 2 .  Crisaborole (EUCRISA) 2 % OINT, Apply to skin qd-bid, Disp: 100 g, Rfl: 2 .  ferrous sulfate 325 (65 FE) MG tablet, TAKE 1 TABLET BY MOUTH 3 TIMES DAILY WITH MEALS. (Patient taking differently: Take 325 mg by mouth 3 (three) times daily with meals. ), Disp: 270 tablet, Rfl: 3 .  FLUoxetine (PROZAC) 20 MG  capsule, TAKE 1 CAPSULE DAILY. TAKE WITH 40MG  TO EQUAL 60MG     ONCE DAILY (Patient taking differently: Take 20 mg by mouth daily. TAKE 1 CAPSULE DAILY. TAKE WITH 40MG  TO EQUAL 60MG     ONCE DAILY), Disp: 90 capsule, Rfl: 1 .  FLUoxetine (PROZAC) 40 MG capsule, Take 1 capsule (40 mg total) by mouth every morning., Disp: 90 capsule, Rfl: 1 .  furosemide (LASIX) 20 MG tablet, Take 1 tablet (20 mg total) by mouth as needed (As needed for ankle swelling)., Disp: 90 tablet, Rfl: 3 .  gabapentin (NEURONTIN) 100 MG capsule, Take 2 capsules (200 mg total) by mouth 2 (two)  times daily., Disp: 120 capsule, Rfl: 1 .  levothyroxine (SYNTHROID) 75 MCG tablet, TAKE 1 TABLET (75 MCG TOTAL) BY MOUTH DAILY BEFORE BREAKFAST., Disp: 90 tablet, Rfl: 1 .  midodrine (PROAMATINE) 10 MG tablet, Take 1 tablet (10 mg total) by mouth 3 (three) times daily., Disp: 270 tablet, Rfl: 3 .  mirtazapine (REMERON) 15 MG tablet, TAKE 1 TABLET BY MOUTH EVERYDAY AT BEDTIME (Patient taking differently: Take 15 mg by mouth at bedtime. ), Disp: 90 tablet, Rfl: 1 .  nitroGLYCERIN (NITROSTAT) 0.4 MG SL tablet, Place 1 tablet (0.4 mg total) under the tongue every 5 (five) minutes as needed for chest pain., Disp: 10 tablet, Rfl: 1 .  pantoprazole (PROTONIX) 40 MG tablet, TAKE 1 TABLET BY MOUTH TWICE A DAY, Disp: 180 tablet, Rfl: 1 .  tamsulosin (FLOMAX) 0.4 MG CAPS capsule, Take 0.4 mg by mouth at bedtime., Disp: , Rfl:  .  triamcinolone cream (KENALOG) 0.1 %, APPLY 1 APPLICATION TOPICALLY 2 (TWO) TIMES DAILY. AVOID FACE, GROIN, UNDERARMS., Disp: 454 g, Rfl: 0 .  vitamin B-12 (CYANOCOBALAMIN) 1000 MCG tablet, Take 1,000 mcg by mouth daily. , Disp: , Rfl:  .  zolpidem (AMBIEN) 5 MG tablet, Take 5 mg by mouth at bedtime. , Disp: , Rfl:    Garner Nash, DO [facility] 02/13/2020 3:04 PM

## 2020-02-18 MED ORDER — ZOLPIDEM TARTRATE 5 MG PO TABS
5.0000 mg | ORAL_TABLET | Freq: Every day | ORAL | 0 refills | Status: DC
Start: 2020-02-18 — End: 2020-03-19

## 2020-02-25 ENCOUNTER — Other Ambulatory Visit: Payer: Medicare Other

## 2020-02-25 ENCOUNTER — Other Ambulatory Visit: Payer: Self-pay

## 2020-02-25 ENCOUNTER — Ambulatory Visit (INDEPENDENT_AMBULATORY_CARE_PROVIDER_SITE_OTHER): Payer: Medicare Other

## 2020-02-25 DIAGNOSIS — Z0000 Encounter for general adult medical examination without abnormal findings: Secondary | ICD-10-CM

## 2020-02-25 NOTE — Progress Notes (Signed)
PCP notes:  Health Maintenance: No gaps noted   Abnormal Screenings: none   Patient concerns: none   Nurse concerns: none   Next PCP appt.: 03/19/2020 @ 2:20 pm

## 2020-02-25 NOTE — Patient Instructions (Signed)
Shawn Lam , Thank you for taking time to come for your Medicare Wellness Visit. I appreciate your ongoing commitment to your health goals. Please review the following plan we discussed and let me know if I can assist you in the future.   Screening recommendations/referrals: Colonoscopy: Up to date, completed 01/21/2019, due 01/2024 Recommended yearly ophthalmology/optometry visit for glaucoma screening and checkup Recommended yearly dental visit for hygiene and checkup  Vaccinations: Influenza vaccine: Up to date, completed 01/01/2020, due 11/2020 Pneumococcal vaccine: Completed series Tdap vaccine: Up to date, completed 03/13/2019, due 03/2029 Shingles vaccine: due, check with your insurance regarding coverage/cost if interested   Covid-19: Completed series  Advanced directives: Advance directive discussed with you today. Even though you declined this today please call our office should you change your mind and we can give you the proper paperwork for you to fill out.   Conditions/risks identified: hyperlipidemia  Next appointment: Follow up in one year for your annual wellness visit.   Preventive Care 69 Years and Older, Male Preventive care refers to lifestyle choices and visits with your health care provider that can promote health and wellness. What does preventive care include?  A yearly physical exam. This is also called an annual well check.  Dental exams once or twice a year.  Routine eye exams. Ask your health care provider how often you should have your eyes checked.  Personal lifestyle choices, including:  Daily care of your teeth and gums.  Regular physical activity.  Eating a healthy diet.  Avoiding tobacco and drug use.  Limiting alcohol use.  Practicing safe sex.  Taking low doses of aspirin every day.  Taking vitamin and mineral supplements as recommended by your health care provider. What happens during an annual well check? The services and screenings  done by your health care provider during your annual well check will depend on your age, overall health, lifestyle risk factors, and family history of disease. Counseling  Your health care provider may ask you questions about your:  Alcohol use.  Tobacco use.  Drug use.  Emotional well-being.  Home and relationship well-being.  Sexual activity.  Eating habits.  History of falls.  Memory and ability to understand (cognition).  Work and work Statistician. Screening  You may have the following tests or measurements:  Height, weight, and BMI.  Blood pressure.  Lipid and cholesterol levels. These may be checked every 5 years, or more frequently if you are over 67 years old.  Skin check.  Lung cancer screening. You may have this screening every year starting at age 68 if you have a 30-pack-year history of smoking and currently smoke or have quit within the past 15 years.  Fecal occult blood test (FOBT) of the stool. You may have this test every year starting at age 69.  Flexible sigmoidoscopy or colonoscopy. You may have a sigmoidoscopy every 5 years or a colonoscopy every 10 years starting at age 88.  Prostate cancer screening. Recommendations will vary depending on your family history and other risks.  Hepatitis C blood test.  Hepatitis B blood test.  Sexually transmitted disease (STD) testing.  Diabetes screening. This is done by checking your blood sugar (glucose) after you have not eaten for a while (fasting). You may have this done every 1-3 years.  Abdominal aortic aneurysm (AAA) screening. You may need this if you are a current or former smoker.  Osteoporosis. You may be screened starting at age 64 if you are at high risk. Talk with  your health care provider about your test results, treatment options, and if necessary, the need for more tests. Vaccines  Your health care provider may recommend certain vaccines, such as:  Influenza vaccine. This is recommended  every year.  Tetanus, diphtheria, and acellular pertussis (Tdap, Td) vaccine. You may need a Td booster every 10 years.  Zoster vaccine. You may need this after age 55.  Pneumococcal 13-valent conjugate (PCV13) vaccine. One dose is recommended after age 72.  Pneumococcal polysaccharide (PPSV23) vaccine. One dose is recommended after age 4. Talk to your health care provider about which screenings and vaccines you need and how often you need them. This information is not intended to replace advice given to you by your health care provider. Make sure you discuss any questions you have with your health care provider. Document Released: 04/24/2015 Document Revised: 12/16/2015 Document Reviewed: 01/27/2015 Elsevier Interactive Patient Education  2017 [place] Prevention in the Home Falls can cause injuries. They can happen to people of all ages. There are many things you can do to make your home safe and to help prevent falls. What can I do on the outside of my home?  Regularly fix the edges of walkways and driveways and fix any cracks.  Remove anything that might make you trip as you walk through a door, such as a raised step or threshold.  Trim any bushes or trees on the path to your home.  Use bright outdoor lighting.  Clear any walking paths of anything that might make someone trip, such as rocks or tools.  Regularly check to see if handrails are loose or broken. Make sure that both sides of any steps have handrails.  Any raised decks and porches should have guardrails on the edges.  Have any leaves, snow, or ice cleared regularly.  Use sand or salt on walking paths during winter.  Clean up any spills in your garage right away. This includes oil or grease spills. What can I do in the bathroom?  Use night lights.  Install grab bars by the toilet and in the tub and shower. Do not use towel bars as grab bars.  Use non-skid mats or decals in the tub or shower.  If  you need to sit down in the shower, use a plastic, non-slip stool.  Keep the floor dry. Clean up any water that spills on the floor as soon as it happens.  Remove soap buildup in the tub or shower regularly.  Attach bath mats securely with double-sided non-slip rug tape.  Do not have throw rugs and other things on the floor that can make you trip. What can I do in the bedroom?  Use night lights.  Make sure that you have a light by your bed that is easy to reach.  Do not use any sheets or blankets that are too big for your bed. They should not hang down onto the floor.  Have a firm chair that has side arms. You can use this for support while you get dressed.  Do not have throw rugs and other things on the floor that can make you trip. What can I do in the kitchen?  Clean up any spills right away.  Avoid walking on wet floors.  Keep items that you use a lot in easy-to-reach places.  If you need to reach something above you, use a strong step stool that has a grab bar.  Keep electrical cords out of the way.  Do not  use floor polish or wax that makes floors slippery. If you must use wax, use non-skid floor wax.  Do not have throw rugs and other things on the floor that can make you trip. What can I do with my stairs?  Do not leave any items on the stairs.  Make sure that there are handrails on both sides of the stairs and use them. Fix handrails that are broken or loose. Make sure that handrails are as long as the stairways.  Check any carpeting to make sure that it is firmly attached to the stairs. Fix any carpet that is loose or worn.  Avoid having throw rugs at the top or bottom of the stairs. If you do have throw rugs, attach them to the floor with carpet tape.  Make sure that you have a light switch at the top of the stairs and the bottom of the stairs. If you do not have them, ask someone to add them for you. What else can I do to help prevent falls?  Wear shoes  that:  Do not have high heels.  Have rubber bottoms.  Are comfortable and fit you well.  Are closed at the toe. Do not wear sandals.  If you use a stepladder:  Make sure that it is fully opened. Do not climb a closed stepladder.  Make sure that both sides of the stepladder are locked into place.  Ask someone to hold it for you, if possible.  Clearly mark and make sure that you can see:  Any grab bars or handrails.  First and last steps.  Where the edge of each step is.  Use tools that help you move around (mobility aids) if they are needed. These include:  Canes.  Walkers.  Scooters.  Crutches.  Turn on the lights when you go into a dark area. Replace any light bulbs as soon as they burn out.  Set up your furniture so you have a clear path. Avoid moving your furniture around.  If any of your floors are uneven, fix them.  If there are any pets around you, be aware of where they are.  Review your medicines with your doctor. Some medicines can make you feel dizzy. This can increase your chance of falling. Ask your doctor what other things that you can do to help prevent falls. This information is not intended to replace advice given to you by your health care provider. Make sure you discuss any questions you have with your health care provider. Document Released: 01/22/2009 Document Revised: 09/03/2015 Document Reviewed: 05/02/2014 Elsevier Interactive Patient Education  2017 [employer].

## 2020-02-25 NOTE — Progress Notes (Signed)
Subjective:   Shawn Lam is a 69 y.o. male who presents for Medicare Annual/Subsequent preventive examination.  Review of Systems: N/A      I connected with the patient today by telephone and verified that I am speaking with the correct person using two identifiers. Location patient: home Location nurse: work Persons participating in the telephone visit: patient, nurse.   I discussed the limitations, risks, security and privacy concerns of performing an evaluation and management service by telephone and the availability of in person appointments. I also discussed with the patient that there may be a patient responsible charge related to this service. The patient expressed understanding and verbally consented to this telephonic visit.        Cardiac Risk Factors include: advanced age (>62men, >51 women);Other (see comment), Risk factor comments: hyperlipidemia     Objective:    Today's Vitals   02/25/20 1110  PainSc: 0-No pain   There is no height or weight on file to calculate BMI.  Advanced Directives 02/25/2020 02/07/2020 02/07/2020 01/06/2020 01/06/2020 01/04/2020 02/21/2019  Does Patient Have a Medical Advance Directive? No No No No Yes No No  Type of Advance Directive - - - - Press photographer - -  Does patient want to make changes to medical advance directive? - - - No - Patient declined No - Patient declined - -  Copy of [place] in Chart? - - - - No - copy requested - -  Would patient like information on creating a medical advance directive? No - Patient declined - - No - Patient declined - - No - Patient declined  Some encounter information is confidential and restricted. Go to Review Flowsheets activity to see all data.    Current Medications (verified) Outpatient Encounter Medications as of 02/25/2020  Medication Sig  . albuterol (VENTOLIN HFA) 108 (90 Base) MCG/ACT inhaler Inhale 2 puffs into the lungs every 4 (four) hours as  needed for wheezing or shortness of breath.  [name] alendronate (FOSAMAX) 70 MG tablet TAKE 1 TABLET BY MOUTH EVERY 7 DAYS WITH A FULL GLASS OF WATER ON AN EMPTY STOMACH.  [name] atorvastatin (LIPITOR) 40 MG tablet Take 40 mg by mouth daily.  . busPIRone (BUSPAR) 10 MG tablet TAKE 2 TABLETS (20 MG TOTAL) BY MOUTH 3 (THREE) TIMES DAILY.  [name] clopidogrel (PLAVIX) 75 MG tablet TAKE 1 TABLET DAILY  . Crisaborole (EUCRISA) 2 % OINT Apply to skin qd-bid  . ferrous sulfate 325 (65 FE) MG tablet TAKE 1 TABLET BY MOUTH 3 TIMES DAILY WITH MEALS. (Patient taking differently: Take 325 mg by mouth 3 (three) times daily with meals. )  . FLUoxetine (PROZAC) 20 MG capsule TAKE 1 CAPSULE DAILY. TAKE WITH 40MG  TO EQUAL 60MG     ONCE DAILY (Patient taking differently: Take 20 mg by mouth daily. TAKE 1 CAPSULE DAILY. TAKE WITH 40MG  TO EQUAL 60MG     ONCE DAILY)  . FLUoxetine (PROZAC) 40 MG capsule Take 1 capsule (40 mg total) by mouth every morning.  . furosemide (LASIX) 20 MG tablet Take 1 tablet (20 mg total) by mouth as needed (As needed for ankle swelling).  . gabapentin (NEURONTIN) 100 MG capsule Take 2 capsules (200 mg total) by mouth 2 (two) times daily.  [name] levothyroxine (SYNTHROID) 75 MCG tablet TAKE 1 TABLET (75 MCG TOTAL) BY MOUTH DAILY BEFORE BREAKFAST.  . midodrine (PROAMATINE) 10 MG tablet Take 1 tablet (10 mg total) by mouth 3 (three) times daily.  [name]  mirtazapine (REMERON) 15 MG tablet TAKE 1 TABLET BY MOUTH EVERYDAY AT BEDTIME (Patient taking differently: Take 15 mg by mouth at bedtime. )  . nitroGLYCERIN (NITROSTAT) 0.4 MG SL tablet Place 1 tablet (0.4 mg total) under the tongue every 5 (five) minutes as needed for chest pain.  . pantoprazole (PROTONIX) 40 MG tablet TAKE 1 TABLET BY MOUTH TWICE A DAY  . tamsulosin (FLOMAX) 0.4 MG CAPS capsule Take 0.4 mg by mouth at bedtime.  . triamcinolone cream (KENALOG) 0.1 % APPLY 1 APPLICATION TOPICALLY 2 (TWO) TIMES DAILY. AVOID FACE, GROIN, UNDERARMS.  . vitamin B-12  (CYANOCOBALAMIN) 1000 MCG tablet Take 1,000 mcg by mouth daily.   [name] zolpidem (AMBIEN) 5 MG tablet Take 1 tablet (5 mg total) by mouth at bedtime.   No facility-administered encounter medications on file as of 02/25/2020.    Allergies (verified) Codeine and Tape   History: Past Medical History:  Diagnosis Date  . Anemia   . Anxiety   . Asymptomatic LV dysfunction    50-55% by echo 06/2016  . Berger's disease   . CAD (coronary artery disease) 06/2005   PCI of LAD and RCA  . Depression   . Dilated aortic root (HCC)    4cm at sinus of Valsalva  . Hematuria   . Hyperlipidemia   . Hypothyroidism   . Insomnia   . Left ureteral stone   . Orthostatic hypotension    negative tilt table  . OSA (obstructive sleep apnea)    moderate with AHI 17/hr, uses CPAP nightly(01/15/19 - has Inspire implant)  . PVC's (premature ventricular contractions) 07/07/2016  . Renal disorder    PT IS SEEING DR. Lorrene Reid- NEPHROLOGIST  . Rosacea   . Urticaria    Past Surgical History:  Procedure Laterality Date  . CARDIAC CATHETERIZATION  09/06/2013   [place]  . CARDIAC CATHETERIZATION    . COLONOSCOPY WITH PROPOFOL N/A 01/21/2019   Procedure: COLONOSCOPY WITH PROPOFOL;  Surgeon: Lucilla Lame, MD;  Location: [place];  Service: Endoscopy;  Laterality: N/A;  sleep apnea  . CORONARY ANGIOPLASTY  2004   STENT PLACEMENT  . CYSTOSCOPY WITH RETROGRADE PYELOGRAM, URETEROSCOPY AND STENT PLACEMENT Left 03/19/2014   Procedure: CYSTOSCOPY WITH RETROGRADE PYELOGRAM, URETEROSCOPY AND STENT PLACEMENT;  Surgeon: Junious Dresser, MD;  Location: [facility];  Service: Urology;  Laterality: Left;  . CYSTOSCOPY WITH RETROGRADE PYELOGRAM, URETEROSCOPY AND STENT PLACEMENT Bilateral 05/20/2015   Procedure:  CYSTOSCOPY WITH BILATERAL RETROGRADE PYELOGRAM,RIGHT  DIAGNOSTIC URETEROSCOPY ,LEFT URETEROSCOPY WITH HOLMIUM LASER  AND BILATERAL  STENT PLACEMENT ;  Surgeon: Alexis Frock, MD;  Location: [facility];  Service: Urology;   Laterality: Bilateral;  . DRUG INDUCED ENDOSCOPY N/A 10/20/2017   Procedure: DRUG INDUCED SLEEP ENDOSCOPY;  Surgeon: Melida Quitter, MD;  Location: [place];  Service: ENT;  Laterality: N/A;  . EP IMPLANTABLE DEVICE N/A 05/13/2016   Procedure: Loop Recorder Insertion;  Surgeon: Deboraha Sprang, MD;  Location: [facility] [facility];  Service: Cardiovascular;  Laterality: N/A;  . ESOPHAGOGASTRODUODENOSCOPY (EGD) WITH PROPOFOL N/A 11/30/2018   Procedure: ESOPHAGOGASTRODUODENOSCOPY (EGD) WITH PROPOFOL;  Surgeon: Jonathon Bellows, MD;  Location: [facility];  Service: Gastroenterology;  Laterality: N/A;  . ESOPHAGOGASTRODUODENOSCOPY (EGD) WITH PROPOFOL N/A 12/01/2018   Procedure: ESOPHAGOGASTRODUODENOSCOPY (EGD) WITH PROPOFOL;  Surgeon: Lucilla Lame, MD;  Location[facility];  Service: Endoscopy;  Laterality: N/A;  . HOLMIUM LASER APPLICATION Bilateral 10/17/4694   Procedure: HOLMIUM LASER APPLICATION;  Surgeon: Alexis Frock, MD;  Location: [facility];  Service: Urology;  Laterality: Bilateral;  . IMPLIMENTATION OF A HYPOGLOSSAL NERVE STIMULATOR  12/08/2017   [facility]   . LEFT HEART CATHETERIZATION WITH CORONARY ANGIOGRAM N/A 09/06/2013   Procedure: LEFT HEART CATHETERIZATION WITH CORONARY ANGIOGRAM;  Surgeon: Sinclair Grooms, MD;  Location: [place] [facility];  Service: Cardiovascular;  Laterality: N/A;  . LEFT KNEE CAP  SURGERY ABOUT 40 YRS AGO  ? 1970    . STONE EXTRACTION WITH BASKET Left 03/19/2014   Procedure: STONE EXTRACTION WITH BASKET;  Surgeon: Junious Dresser, MD;  Location: [facility];  Service: Urology;  Laterality: Left;   Family History  Problem Relation Age of Onset  . Coronary artery disease Mother   . Heart attack Mother   . Heart disease Mother   . Hypertension Mother   . Cancer Father        lung and colon  . Lung cancer Father        smoker  . Diabetes Brother    Social History   Socioeconomic History  . Marital status: Married    Spouse name: Not on file  . Number of  children: Not on file  . Years of education: Not on file  . Highest education level: Not on file  Occupational History  . Occupation: retired Print production planner  Tobacco Use  . Smoking status: Never Smoker  . Smokeless tobacco: Never Used  Vaping Use  . Vaping Use: Never used  Substance and Sexual Activity  . Alcohol use: Yes    Alcohol/week: 1.0 standard drink    Types: 1 Cans of beer per week    Comment: Occasional beer  . Drug use: No  . Sexual activity: Yes    Partners: Female    Birth control/protection: None  Other Topics Concern  . Not on file  Social History Narrative   Regular exercise--yes--jog 5 miles 6 days a week   Social Determinants of Health   Financial Resource Strain: Low Risk   . Difficulty of Paying Living Expenses: Not hard at all  Food Insecurity: No Food Insecurity  . Worried About Charity fundraiser in the Last Year: Never true  . Ran Out of Food in the Last Year: Never true  Transportation Needs: No Transportation Needs  . Lack of Transportation (Medical): No  . Lack of Transportation (Non-Medical): No  Physical Activity: Sufficiently Active  . Days of Exercise per Week: 6 days  . Minutes of Exercise per Session: 60 min  Stress: No Stress Concern Present  . Feeling of Stress : Not at all  Social Connections:   . Frequency of Communication with Friends and Family: Not on file  . Frequency of Social Gatherings with Friends and Family: Not on file  . Attends Religious Services: Not on file  . Active Member of Clubs or Organizations: Not on file  . Attends Archivist Meetings: Not on file  . Marital Status: Not on file    Tobacco Counseling Counseling given: Not Answered   Clinical Intake:  Pre-visit preparation completed: Yes  Pain : No/denies pain Pain Score: 0-No pain     Nutritional Risks: None Diabetes: No  How often do you need to have someone help you when you read instructions, pamphlets, or other written materials from  your doctor or pharmacy?: 1 - Never What is the last grade level you completed in school?: 12th  Diabetic: No Nutrition Risk Assessment:  Has the patient had any N/V/D within the last 2 months?  No  Does the patient have any non-healing wounds?  No  Has the patient had any unintentional weight loss or weight gain?  No   Diabetes:  Is the patient diabetic?  No  If diabetic, was a CBG obtained today?  N/A, telephone visit  Did the patient bring in their glucometer from home?  N/A How often do you monitor your CBG's? N/A.   Financial Strains and Diabetes Management:  Are you having any financial strains with the device, your supplies or your medication? N/A.  Does the patient want to be seen by Chronic Care Management for management of their diabetes?  N/A Would the patient like to be referred to a Nutritionist or for Diabetic Management?  N/A  Interpreter Needed?: No  Information entered by :: CJohnson, LPN   Activities of Daily Living In your present state of health, do you have any difficulty performing the following activities: 02/25/2020 01/06/2020  Hearing? N N  Vision? N N  Difficulty concentrating or making decisions? N N  Walking or climbing stairs? N N  Dressing or bathing? N N  Doing errands, shopping? N N  Preparing Food and eating ? N -  Using the Toilet? N -  In the past six months, have you accidently leaked urine? N -  Do you have problems with loss of bowel control? N -  Managing your Medications? N -  Managing your Finances? N -  Housekeeping or managing your Housekeeping? N -  Some recent data might be hidden    Patient Care Team: Jinny Sanders, MD as PCP - General Rockey Situ Kathlene November, MD as PCP - Cardiology (Cardiology)  Indicate any recent Medical Services you may have received from other than [facility] providers in the past year (date may be approximate).     Assessment:   This is a routine wellness examination for Shawn Lam.  Hearing/Vision screen   Hearing Screening   125Hz  250Hz  500Hz  1000Hz  2000Hz  3000Hz  4000Hz  6000Hz  8000Hz   Right ear:           Left ear:           Vision Screening Comments: Patient gets annual eye exams.  Dietary issues and exercise activities discussed: Current Exercise Habits: Home exercise routine, Type of exercise: walking, Time (Minutes): 60, Frequency (Times/Week): 6, Weekly Exercise (Minutes/Week): 360, Intensity: Moderate, Exercise limited by: None identified  Goals    . DIET - EAT MORE FRUITS AND VEGETABLES     Starting 02/13/2018, I will attempt to eat at least 2 servings of fresh fruits and vegetables daily.     . Patient Stated     02/21/2019, I will maintain and continue medications as prescribed.    . Patient Stated     02/25/2020, I will continue to walk 6 days a week for 1 hour.       Depression Screen PHQ 2/9 Scores 02/25/2020 08/22/2019 02/21/2019 02/13/2018 02/10/2017 02/09/2016 01/31/2013  PHQ - 2 Score 0 0 0 0 0 6 0  PHQ- 9 Score 0 - 0 0 - 19 -  Some encounter information is confidential and restricted. Go to Review Flowsheets activity to see all data.    Fall Risk Fall Risk  02/25/2020 02/21/2019 02/27/2018 02/13/2018 02/10/2017  Falls in the past year? 0 1 1 0 No  Comment - passed out due to low hemoglobin Emmi Telephone Survey: data to providers prior to load - -  Number falls in past yr: 0 0 1 - -  Comment - -  Emmi Telephone Survey Actual Response = 40 - -  Injury with Fall? 0 1 0 - -  Comment - Patient fractured his arm - - -  Risk for fall due to : Medication side effect Other (Comment) - - -  Risk for fall due to: Comment - low hemoglobin - - -  Follow up Falls evaluation completed;Falls prevention discussed Falls evaluation completed;Falls prevention discussed - - -    Any stairs in or around the home? Yes  If so, are there any without handrails? No  Home free of loose throw rugs in walkways, pet beds, electrical cords, etc? Yes  Adequate lighting in your home to reduce risk  of falls? Yes   ASSISTIVE DEVICES UTILIZED TO PREVENT FALLS:  Life alert? No  Use of a cane, walker or w/c? No  Grab bars in the bathroom? No  Shower chair or bench in shower? No  Elevated toilet seat or a handicapped toilet? No   TIMED UP AND GO:  Was the test performed? N/A, telephone visit .    Cognitive Function: MMSE - Mini Mental State Exam 02/25/2020 02/21/2019 02/13/2018  Orientation to time 5 5 5   Orientation to Place 5 5 5   Registration 3 3 3   Attention/ Calculation 5 5 0  Recall 3 3 2   Recall-comments - - unable to recall 1 of 3 words  Language- name 2 objects - - 0  Language- repeat 1 1 1   Language- follow 3 step command - - 3  Language- read & follow direction - - 0  Write a sentence - - 0  Copy design - - 0  Total score - - 19  Mini Cog  Mini-Cog screen was completed. Maximum score is 22. A value of 0 denotes this part of the MMSE was not completed or the patient failed this part of the Mini-Cog screening.       Immunizations Immunization History  Administered Date(s) Administered  . Fluad Quad(high Dose 65+) 12/04/2018, 01/01/2020  . Influenza Split 01/04/2011, 01/27/2012  . Influenza Whole 01/09/2008, 03/04/2009, 12/27/2009  . Influenza,inj,Quad PF,6+ Mos 01/31/2013, 01/31/2014, 02/03/2015, 02/09/2016, 01/20/2017, 01/23/2018  . PFIZER SARS-COV-2 Vaccination 06/01/2019, 06/25/2019  . Pneumococcal Conjugate-13 02/09/2016  . Pneumococcal Polysaccharide-23 02/23/2017  . Td 06/05/2007  . Tdap 03/13/2019  . Zoster 01/25/2011    TDAP status: Up to date Flu Vaccine status: Up to date Pneumococcal vaccine status: Up to date Covid-19 vaccine status: Completed vaccines  Qualifies for Shingles Vaccine? Yes   Zostavax completed Yes   Shingrix Completed?: No.    Education has been provided regarding the importance of this vaccine. Patient has been advised to call insurance company to determine out of pocket expense if they have not yet received this  vaccine. Advised may also receive vaccine at local pharmacy or Health Dept. Verbalized acceptance and understanding.  Screening Tests Health Maintenance  Topic Date Due  . COLONOSCOPY  01/21/2024  . TETANUS/TDAP  03/12/2029  . INFLUENZA VACCINE  Completed  . COVID-19 Vaccine  Completed  . Hepatitis C Screening  Completed  . PNA vac Low Risk Adult  Completed    Health Maintenance  There are no preventive care reminders to display for this patient.  Colorectal cancer screening: Completed 01/21/2019. Repeat every 5 years  Lung Cancer Screening: (Low Dose CT Chest recommended if Age 50-80 years, 30 pack-year currently smoking OR have quit w/in 15 years.) does not qualify.  Additional Screening:  Hepatitis C Screening: does qualify; Completed 02/03/2015  Vision  Screening: Recommended annual ophthalmology exams for early detection of glaucoma and other disorders of the eye. Is the patient up to date with their annual eye exam?  Yes  Who is the provider or what is the name of the office in which the patient attends annual eye exams? Dr. Matilde Sprang If pt is not established with a provider, would they like to be referred to a provider to establish care? No .   Dental Screening: Recommended annual dental exams for proper oral hygiene  Community Resource Referral / Chronic Care Management: CRR required this visit?  No   CCM required this visit?  No      Plan:     I have personally reviewed and noted the following in the patient's chart:   . Medical and social history . Use of alcohol, tobacco or illicit drugs  . Current medications and supplements . Functional ability and status . Nutritional status . Physical activity . Advanced directives . List of other physicians . Hospitalizations, surgeries, and ER visits in previous 12 months . Vitals . Screenings to include cognitive, depression, and falls . Referrals and appointments  In addition, I have reviewed and discussed with  patient certain preventive protocols, quality metrics, and best practice recommendations. A written personalized care plan for preventive services as well as general preventive health recommendations were provided to patient.   Due to this being a telephonic visit, the after visit summary with patients personalized plan was offered to patient via office or my-chart. Patient preferred to pick up at office at next visit or via mychart.   Andrez Grime, LPN   [id number]

## 2020-03-02 ENCOUNTER — Ambulatory Visit (INDEPENDENT_AMBULATORY_CARE_PROVIDER_SITE_OTHER): Payer: Medicare Other

## 2020-03-02 DIAGNOSIS — R002 Palpitations: Secondary | ICD-10-CM | POA: Diagnosis not present

## 2020-03-02 LAB — CUP PACEART REMOTE DEVICE CHECK
Date Time Interrogation Session: 20211120013559
Implantable Pulse Generator Implant Date: 20180202

## 2020-03-03 ENCOUNTER — Ambulatory Visit: Payer: Medicare Other | Admitting: Family Medicine

## 2020-03-04 DIAGNOSIS — F5101 Primary insomnia: Secondary | ICD-10-CM | POA: Diagnosis not present

## 2020-03-04 DIAGNOSIS — G4733 Obstructive sleep apnea (adult) (pediatric): Secondary | ICD-10-CM | POA: Diagnosis not present

## 2020-03-04 NOTE — Progress Notes (Signed)
[facility] Summary Report / Loop Recorder 

## 2020-03-09 ENCOUNTER — Telehealth: Payer: Self-pay | Admitting: Student

## 2020-03-09 NOTE — Telephone Encounter (Signed)
Pt returned call and aware. No desire to have ILR removed at this time. Knows to call back if he would like to schedule an appointment to have it removed.   Legrand Como [address] [place], PA-C  03/09/2020 11:11 AM

## 2020-03-09 NOTE — Telephone Encounter (Signed)
Pt loop at ERI.  LMOM to call back. Will plan to discontinue monitoring once patient aware.   Legrand Como [address] [place], PA-C  03/09/2020 11:03 AM

## 2020-03-12 ENCOUNTER — Other Ambulatory Visit (INDEPENDENT_AMBULATORY_CARE_PROVIDER_SITE_OTHER): Payer: Medicare Other

## 2020-03-12 ENCOUNTER — Other Ambulatory Visit: Payer: Self-pay

## 2020-03-12 ENCOUNTER — Telehealth: Payer: Self-pay | Admitting: Family Medicine

## 2020-03-12 DIAGNOSIS — Z125 Encounter for screening for malignant neoplasm of prostate: Secondary | ICD-10-CM

## 2020-03-12 DIAGNOSIS — E785 Hyperlipidemia, unspecified: Secondary | ICD-10-CM | POA: Diagnosis not present

## 2020-03-12 DIAGNOSIS — D509 Iron deficiency anemia, unspecified: Secondary | ICD-10-CM

## 2020-03-12 LAB — COMPREHENSIVE METABOLIC PANEL
ALT: 21 U/L (ref 0–53)
AST: 36 U/L (ref 0–37)
Albumin: 3.8 g/dL (ref 3.5–5.2)
Alkaline Phosphatase: 75 U/L (ref 39–117)
BUN: 14 mg/dL (ref 6–23)
CO2: 26 mEq/L (ref 19–32)
Calcium: 9 mg/dL (ref 8.4–10.5)
Chloride: 106 mEq/L (ref 96–112)
Creatinine, Ser: 1.33 mg/dL (ref 0.40–1.50)
GFR: 54.46 mL/min — ABNORMAL LOW (ref >60.00)
Glucose, Bld: 106 mg/dL — ABNORMAL HIGH (ref 70–99)
Potassium: 4.4 mEq/L (ref 3.5–5.1)
Sodium: 142 mEq/L (ref 135–145)
Total Bilirubin: 0.7 mg/dL (ref 0.2–1.2)
Total Protein: 6.3 g/dL (ref 6.0–8.3)

## 2020-03-12 LAB — LIPID PANEL
Cholesterol: 159 mg/dL (ref 0–200)
HDL: 79 mg/dL (ref >39.00)
LDL Cholesterol: 70 mg/dL (ref 0–99)
NonHDL: 79.82
Total CHOL/HDL Ratio: 2
Triglycerides: 47 mg/dL (ref 0.0–149.0)
VLDL: 9.4 mg/dL (ref 0.0–40.0)

## 2020-03-12 LAB — PSA, MEDICARE: PSA: 0.7 ng/ml (ref 0.10–4.00)

## 2020-03-12 NOTE — Telephone Encounter (Signed)
-----   Message from Ellamae Sia sent at 02/25/2020 12:33 PM EST ----- Regarding: Lab orders for Thursday, 12.2.21 Patient is scheduled for CPX labs, please order future labs, Thanks , Karna Christmas

## 2020-03-12 NOTE — Progress Notes (Signed)
No critical labs need to be addressed urgently. We will discuss labs in detail at upcoming office visit.   

## 2020-03-14 ENCOUNTER — Other Ambulatory Visit: Payer: Self-pay | Admitting: Family Medicine

## 2020-03-16 ENCOUNTER — Other Ambulatory Visit: Payer: Self-pay | Admitting: Family Medicine

## 2020-03-16 DIAGNOSIS — N3943 Post-void dribbling: Secondary | ICD-10-CM | POA: Insufficient documentation

## 2020-03-16 MED ORDER — TAMSULOSIN HCL 0.4 MG PO CAPS: 0.8000 mg | ORAL_CAPSULE | Freq: Every day | ORAL | 1 refills | Status: DC

## 2020-03-16 NOTE — Assessment & Plan Note (Signed)
S/P COVID. Gradually improving with time. No red flags.

## 2020-03-16 NOTE — Assessment & Plan Note (Signed)
Completed remdesivir and steroids.  No longer with oxygen requirement.

## 2020-03-16 NOTE — Assessment & Plan Note (Signed)
Increase flomax to 0.8 mg.. follow BP  VERY CLOSELY and stop if causing more hypotension.

## 2020-03-16 NOTE — Assessment & Plan Note (Signed)
Resolved

## 2020-03-18 ENCOUNTER — Other Ambulatory Visit: Payer: Self-pay | Admitting: Family Medicine

## 2020-03-18 NOTE — Telephone Encounter (Signed)
Last office visit 02/11/2020 for follow up ER visit.  Last refilled 01/01/2020 for #120 with 1 refill.  CPE scheduled for 03/19/2020.

## 2020-03-19 ENCOUNTER — Other Ambulatory Visit: Payer: Self-pay | Admitting: Family Medicine

## 2020-03-19 ENCOUNTER — Other Ambulatory Visit: Payer: Self-pay

## 2020-03-19 ENCOUNTER — Ambulatory Visit (INDEPENDENT_AMBULATORY_CARE_PROVIDER_SITE_OTHER): Payer: Medicare Other | Admitting: Family Medicine

## 2020-03-19 ENCOUNTER — Encounter: Payer: Self-pay | Admitting: Family Medicine

## 2020-03-19 VITALS — BP 120/68 | HR 69 | Temp 98.0°F | Ht 69.25 in | Wt 170.5 lb

## 2020-03-19 DIAGNOSIS — F320 Major depressive disorder, single episode, mild: Secondary | ICD-10-CM | POA: Diagnosis not present

## 2020-03-19 DIAGNOSIS — D509 Iron deficiency anemia, unspecified: Secondary | ICD-10-CM | POA: Diagnosis not present

## 2020-03-19 DIAGNOSIS — E039 Hypothyroidism, unspecified: Secondary | ICD-10-CM

## 2020-03-19 DIAGNOSIS — G629 Polyneuropathy, unspecified: Secondary | ICD-10-CM | POA: Insufficient documentation

## 2020-03-19 DIAGNOSIS — F411 Generalized anxiety disorder: Secondary | ICD-10-CM | POA: Diagnosis not present

## 2020-03-19 DIAGNOSIS — F32A Depression, unspecified: Secondary | ICD-10-CM

## 2020-03-19 DIAGNOSIS — F5104 Psychophysiologic insomnia: Secondary | ICD-10-CM | POA: Insufficient documentation

## 2020-03-19 DIAGNOSIS — E785 Hyperlipidemia, unspecified: Secondary | ICD-10-CM

## 2020-03-19 DIAGNOSIS — I2510 Atherosclerotic heart disease of native coronary artery without angina pectoris: Secondary | ICD-10-CM

## 2020-03-19 DIAGNOSIS — G609 Hereditary and idiopathic neuropathy, unspecified: Secondary | ICD-10-CM

## 2020-03-19 DIAGNOSIS — M810 Age-related osteoporosis without current pathological fracture: Secondary | ICD-10-CM

## 2020-03-19 MED ORDER — GABAPENTIN 100 MG PO CAPS
200.0000 mg | ORAL_CAPSULE | Freq: Two times a day (BID) | ORAL | 1 refills | Status: DC
Start: 2020-03-19 — End: 2020-03-25

## 2020-03-19 NOTE — Assessment & Plan Note (Signed)
Stable, chronic.  Continue current medication. On atorvastatin 40 mg once daily.

## 2020-03-19 NOTE — Assessment & Plan Note (Signed)
Stable, chronic.  Continue current medication.    

## 2020-03-19 NOTE — Assessment & Plan Note (Signed)
Chronic.[name] 2 stents in place. Followed by Dr. Rockey Situ.

## 2020-03-19 NOTE — Assessment & Plan Note (Signed)
Stable control on Ambien... pulmonary prescribing 10 mg  At bedtime.... 5 mg was ineffective. He has requested that I take over the higher dose prescription

## 2020-03-19 NOTE — Assessment & Plan Note (Signed)
Stable, chronic.  Continue current medication. See med list in note for doses... continue.

## 2020-03-19 NOTE — Progress Notes (Signed)
Chief Complaint  Patient presents with  . Annual Exam    Part 2    History of Present Illness: HPI  The patient presents for yearly review of chronic health problems. He/She also has the following acute concerns today: none    The patient saw a LPN or RN for medicare wellness visit.  Prevention and wellness was reviewed in detail. Note reviewed and important notes copied below.  Health Maintenance: No gaps noted Abnormal Screenings: none  OSA: Followed by pulmonary 03/04/2020 OV reviewed.. recommend continuation of Inspire therapy.  Chronic insomnia: Stable control on Ambien... pulmonary prescribing 10 mg  At bedtime.... 5 mg was ineffective. He has requested that I take over the higher dose prescription   S/P COVID infection in 01/2020.. breathing back to baseline during the day.. still short of breath at times at night.    IG A nephropathy/CKD: Followed by Dr.  Lorrene Reid.  Mild depression/GAD  Good control on buspar and remeron. Flowsheet Row Clinical Support from 02/25/2020 in [place] at [facility] Total Score 0      CAD: followed by Dr. Rockey Situ cardiology  Hypothyroid  Lab Results  Component Value Date   TSH 2.000 01/23/2020    Elevated Cholesterol:  Chronic stable control on atorvastatin 40 mg daily. LDL at goal < 70 Lab Results  Component Value Date   CHOL 159 03/12/2020   HDL 79.00 03/12/2020   LDLCALC 70 03/12/2020   LDLDIRECT 57 06/22/2009   TRIG 47.0 03/12/2020   CHOLHDL 2 03/12/2020  Using medications without problems: none Muscle aches:  Diet compliance: good Exercise:none Other complaints:  Peripheral neuropathy: Resolved on higher dose of gabapentin 200 mg BID.     Patient Care Team: Jinny Sanders, MD as PCP - General Rockey Situ Kathlene November, MD as PCP - Cardiology (Cardiology) Dr. Nehemiah Massed: Derm  This visit occurred during the SARS-CoV-2 public health emergency.  Safety protocols were in place, including screening  questions prior to the visit, additional usage of staff PPE, and extensive cleaning of exam room while observing appropriate contact time as indicated for disinfecting solutions.   COVID 19 screen:  No recent travel or known exposure to COVID19 The patient denies respiratory symptoms of COVID 19 at this time. The importance of social distancing was discussed today.   Current Outpatient Medications on File Prior to Visit  Medication Sig Dispense Refill  . alendronate (FOSAMAX) 70 MG tablet TAKE 1 TABLET BY MOUTH EVERY 7 DAYS WITH A FULL GLASS OF WATER ON AN EMPTY STOMACH. 12 tablet 3  . atorvastatin (LIPITOR) 40 MG tablet Take 40 mg by mouth daily.    . busPIRone (BUSPAR) 10 MG tablet TAKE 2 TABLETS (20 MG TOTAL) BY MOUTH 3 (THREE) TIMES DAILY. 540 tablet 0  . clopidogrel (PLAVIX) 75 MG tablet TAKE 1 TABLET DAILY 90 tablet 2  . Crisaborole (EUCRISA) 2 % OINT Apply to skin qd-bid 100 g 2  . ferrous sulfate 325 (65 FE) MG tablet TAKE 1 TABLET BY MOUTH 3 TIMES DAILY WITH MEALS. 270 tablet 3  . FLUoxetine (PROZAC) 20 MG capsule TAKE 1 CAPSULE DAILY. TAKE WITH 40MG  TO EQUAL 60MG     ONCE DAILY (Patient taking differently: Take 20 mg by mouth daily. TAKE 1 CAPSULE DAILY. TAKE WITH 40MG  TO EQUAL 60MG     ONCE DAILY) 90 capsule 1  . FLUoxetine (PROZAC) 40 MG capsule Take 1 capsule (40 mg total) by mouth every morning. 90 capsule 1  . furosemide (LASIX) 20 MG  tablet Take 1 tablet (20 mg total) by mouth as needed (As needed for ankle swelling). 90 tablet 3  . levothyroxine (SYNTHROID) 75 MCG tablet TAKE 1 TABLET (75 MCG TOTAL) BY MOUTH DAILY BEFORE BREAKFAST. 90 tablet 1  . midodrine (PROAMATINE) 10 MG tablet Take 1 tablet (10 mg total) by mouth 3 (three) times daily. 270 tablet 3  . mirtazapine (REMERON) 15 MG tablet TAKE 1 TABLET BY MOUTH EVERYDAY AT BEDTIME 90 tablet 0  . nitroGLYCERIN (NITROSTAT) 0.4 MG SL tablet Place 1 tablet (0.4 mg total) under the tongue every 5 (five) minutes as needed for chest  pain. 10 tablet 1  . pantoprazole (PROTONIX) 40 MG tablet TAKE 1 TABLET BY MOUTH TWICE A DAY 180 tablet 1  . tamsulosin (FLOMAX) 0.4 MG CAPS capsule Take 2 capsules (0.8 mg total) by mouth daily. 60 capsule 1  . triamcinolone cream (KENALOG) 0.1 % APPLY 1 APPLICATION TOPICALLY 2 (TWO) TIMES DAILY. AVOID FACE, GROIN, UNDERARMS. 454 g 0  . vitamin B-12 (CYANOCOBALAMIN) 1000 MCG tablet Take 1,000 mcg by mouth daily.     [name] zolpidem (AMBIEN) 10 MG tablet Take 10 mg by mouth at bedtime as needed.     No current facility-administered medications on file prior to visit.     Review of Systems  Constitutional: Negative for chills and fever.  HENT: Negative for congestion and ear pain.   Eyes: Negative for pain and redness.  Respiratory: Negative for cough and shortness of breath.   Cardiovascular: Negative for chest pain, palpitations and leg swelling.  Gastrointestinal: Negative for abdominal pain, blood in stool, constipation, diarrhea, nausea and vomiting.  Genitourinary: Negative for dysuria.  Musculoskeletal: Negative for falls and myalgias.  Skin: Negative for rash.  Neurological: Negative for dizziness.  Psychiatric/Behavioral: Negative for depression. The patient is not nervous/anxious.       Past Medical History:  Diagnosis Date  . Anemia   . Anxiety   . Asymptomatic LV dysfunction    50-55% by echo 06/2016  . Berger's disease   . CAD (coronary artery disease) 06/2005   PCI of LAD and RCA  . Depression   . Dilated aortic root (HCC)    4cm at sinus of Valsalva  . Hematuria   . Hyperlipidemia   . Hypothyroidism   . Insomnia   . Left ureteral stone   . Orthostatic hypotension    negative tilt table  . OSA (obstructive sleep apnea)    moderate with AHI 17/hr, uses CPAP nightly(01/15/19 - has Inspire implant)  . PVC's (premature ventricular contractions) 07/07/2016  . Renal disorder    PT IS SEEING DR. Lorrene Reid- NEPHROLOGIST  . Rosacea   . Urticaria     reports that he has  never smoked. He has never used smokeless tobacco. He reports current alcohol use of about 1.0 standard drink of alcohol per week. He reports that he does not use drugs.   Observations/Objective: Blood pressure 120/68, pulse 69, temperature 98 F (36.7 C), temperature source Temporal, height 5' 9.25" (1.759 m), weight 170 lb 8 oz (77.3 kg), SpO2 96 %.  Physical Exam Constitutional:      General: He is not in acute distress.    Appearance: Normal appearance. He is well-developed and well-nourished. He is not ill-appearing or toxic-appearing.  HENT:     Head: Normocephalic and atraumatic.     Right Ear: Hearing, tympanic membrane, ear canal and external ear normal.     Left Ear: Hearing, tympanic membrane, ear canal and  external ear normal.     Nose: Nose normal.     Mouth/Throat:     Mouth: Oropharynx is clear and moist and mucous membranes are normal.     Pharynx: Uvula midline.  Eyes:     General: Lids are normal. Lids are everted, no foreign bodies appreciated.     Extraocular Movements: EOM normal.     Conjunctiva/sclera: Conjunctivae normal.     Pupils: Pupils are equal, round, and reactive to light.  Neck:     Thyroid: No thyroid mass or thyromegaly.     Vascular: No carotid bruit.     Trachea: Trachea and phonation normal.  Cardiovascular:     Rate and Rhythm: Normal rate and regular rhythm.     Pulses: Normal pulses and intact distal pulses.     Heart sounds: S1 normal and S2 normal. No murmur heard. No gallop.   Pulmonary:     Breath sounds: Normal breath sounds. No wheezing, rhonchi or rales.  Abdominal:     General: Bowel sounds are normal.     Palpations: Abdomen is soft. There is no hepatosplenomegaly.     Tenderness: There is no abdominal tenderness. There is no CVA tenderness, guarding or rebound.     Hernia: No hernia is present.  Musculoskeletal:     Cervical back: Normal range of motion and neck supple.  Lymphadenopathy:     Cervical: No cervical adenopathy.   Skin:    General: Skin is warm, dry and intact.     Findings: No rash.     Comments: Raised red area associated with darker mole on right neck.. recommend returning to Derm now for eval and treat  Neurological:     Mental Status: He is alert.     Cranial Nerves: No cranial nerve deficit.     Sensory: No sensory deficit.     Gait: Gait normal.     Deep Tendon Reflexes: Strength normal and reflexes are normal and symmetric.  Psychiatric:        Mood and Affect: Mood and affect normal.        Speech: Speech normal.        Behavior: Behavior normal.        Judgment: Judgment normal.      Assessment and Plan   The patient's preventative maintenance and recommended screening tests for an annual wellness exam were reviewed in full today. Brought up to date unless services declined.  Counselled on the importance of diet, exercise, and its role in overall health and mortality. The patient's FH and SH was reviewed, including their home life, tobacco status, and drug and alcohol status.   Vaccines: Uptodate, flu uptodate.  S/P COVID x 2, consider shingrix and COVID booster in 05/2019 Nonsmoker  Lab Results  Component Value Date   PSA 0.70 03/12/2020   PSA 1.07 02/21/2019   PSA 0.64 02/13/2018       Colon cancer screening: Colonoscopy 01/2019 nml, repeat in 5 years. Hep C: done with eval for increase LFTs. HIV: neg test Osteoporosis: on fosamax.. last check 2019 in [place].. does not wish to return, due now.  Chronic insomnia Stable control on Ambien... pulmonary prescribing 10 mg  At bedtime.... 5 mg was ineffective. He has requested that I take over the higher dose prescription  Generalized anxiety disorder Stable, chronic.  Continue current medication. See med list in note for doses... continue.     Peripheral neuropathy  Resolved on higher dose of gabapentin 200 mg BID.  HLD (hyperlipidemia) Stable, chronic.  Continue current medication. On atorvastatin 40 mg once  daily.  CAD (coronary artery disease) Chronic.[name] 2 stents in place. Followed by Dr. Rockey Situ.   Hypothyroidism Stable, chronic.  Continue current medication.      [name], MD

## 2020-03-19 NOTE — Patient Instructions (Addendum)
Work on getting back to exercise as tolerated.  Consider COVID booster  3 months after infection.  Please call the location of your choice from the menu below to schedule your Bone Density appointment.   #2  [place]   1. [facility]                      Phone:  [phone] [address], [place] [address]                                                             Services: Traditional and 3D Mammogram, Bone Density   2. [place] [facility]                 Phone: [phone] [address], [place] [address]    Service: Bone Density ONLY   *this site does NOT perform mammograms  3. [place]                        Phone:  [phone] [address] [place], [place] [address]                                            Services:  3D Mammogram and Bone Density    [place]  1. [place] [facility]   Phone:  [phone]   [place], [place] [address]                                            Services: 3D Mammogram and Bone Density  2. [place] at [facility])  Phone:  [phone]   [address]  [place], [place] [address]                                              Services:  3D Mammogram and Bone Density  

## 2020-03-19 NOTE — Assessment & Plan Note (Signed)
Resolved on higher dose of gabapentin 200 mg BID.

## 2020-03-25 ENCOUNTER — Telehealth: Payer: Self-pay | Admitting: Family Medicine

## 2020-03-25 MED ORDER — GABAPENTIN 100 MG PO CAPS
200.0000 mg | ORAL_CAPSULE | Freq: Two times a day (BID) | ORAL | 1 refills | Status: DC
Start: 2020-03-25 — End: 2020-05-21

## 2020-03-25 NOTE — Telephone Encounter (Signed)
Contacted [facility] who reports insurance will only pay for 3 tabs per day now. She reports pt picked up the script with a savings card, #120, for $19. They said they sent a PA on 12/9. PA not in tower.   Looked of [facility] and pt can get med at [facility] for $11.21.   Contacted pt and advised of insurance not paying for 4 tabs and he could use Good RX card to get med for $11.21 . Pt requested script be sent to [facility]. Advised a script would be sent to Kristopher Oppenheim and if anything else is needed to contact office. Pt very appreciative and verbalized understanding.   Sent in script to [facility].

## 2020-03-25 NOTE — Telephone Encounter (Signed)
Has a question about his medication . Patient states that [facility] is not wanting to give him his prescription for the amount submitted by the provider. Patient states that he has been talking to you. Its for the Gabapentin . EM

## 2020-03-25 NOTE — Telephone Encounter (Signed)
Contacted [facility] who reports insurance will only pay for 3 tabs per day now. She reports pt picked up the script with a savings card, #120, for $19. They said they sent a PA on 12/9. PA not in tower.   Looked of [facility] and pt can get med at [facility] for $11.21.   Contacted pt and advised of insurance not paying for 4 tabs and he could use Good RX card to get med for $11.21 . Pt requested script be sent to [facility]. Advised a script would be sent to Shawn Lam and if anything else is needed to contact office. Pt very appreciative and verbalized understanding.   Sent in script to [facility].

## 2020-03-25 NOTE — Addendum Note (Signed)
Addended by: Randall An on: 03/25/2020 05:07 PM   Modules accepted: Orders

## 2020-04-02 ENCOUNTER — Other Ambulatory Visit: Payer: Self-pay

## 2020-04-02 ENCOUNTER — Ambulatory Visit
Admission: RE | Admit: 2020-04-02 | Discharge: 2020-04-02 | Disposition: A | Discharge status: Home / Self Care | Payer: Medicare Other | Source: Ambulatory Visit | Attending: Family Medicine | Admitting: Family Medicine

## 2020-04-02 DIAGNOSIS — E039 Hypothyroidism, unspecified: Secondary | ICD-10-CM | POA: Diagnosis not present

## 2020-04-02 DIAGNOSIS — M810 Age-related osteoporosis without current pathological fracture: Secondary | ICD-10-CM | POA: Insufficient documentation

## 2020-04-02 DIAGNOSIS — Z87828 Personal history of other (healed) physical injury and trauma: Secondary | ICD-10-CM | POA: Diagnosis not present

## 2020-04-02 DIAGNOSIS — Z8739 Personal history of other diseases of the musculoskeletal system and connective tissue: Secondary | ICD-10-CM | POA: Diagnosis not present

## 2020-04-08 ENCOUNTER — Other Ambulatory Visit: Payer: Self-pay | Admitting: Family Medicine

## 2020-04-10 ENCOUNTER — Other Ambulatory Visit: Payer: Self-pay | Admitting: Family Medicine

## 2020-04-10 DIAGNOSIS — F320 Major depressive disorder, single episode, mild: Secondary | ICD-10-CM

## 2020-04-10 DIAGNOSIS — F411 Generalized anxiety disorder: Secondary | ICD-10-CM

## 2020-04-10 DIAGNOSIS — F32A Depression, unspecified: Secondary | ICD-10-CM

## 2020-04-16 ENCOUNTER — Other Ambulatory Visit: Payer: Self-pay

## 2020-04-16 ENCOUNTER — Encounter: Payer: Self-pay | Admitting: Family Medicine

## 2020-04-16 ENCOUNTER — Ambulatory Visit (INDEPENDENT_AMBULATORY_CARE_PROVIDER_SITE_OTHER): Payer: Medicare Other | Admitting: Family Medicine

## 2020-04-16 VITALS — BP 109/68 | HR 79 | Temp 97.8°F | Ht 69.25 in | Wt 172.8 lb

## 2020-04-16 DIAGNOSIS — M810 Age-related osteoporosis without current pathological fracture: Secondary | ICD-10-CM | POA: Diagnosis not present

## 2020-04-16 MED ORDER — ZOLPIDEM TARTRATE 10 MG PO TABS
10.0000 mg | ORAL_TABLET | Freq: Every evening | ORAL | 0 refills | Status: DC | PRN
Start: 2020-04-16 — End: 2020-06-15

## 2020-04-16 NOTE — Assessment & Plan Note (Signed)
Chronic worsening Past pathologic fracture.. none now.  Appointment spent  In discussion of bone density, history and reviewing options for treatment.  Patient has selected to stop fosamax as it is not helping and bone density has declined in last 2 years.  We will start Prolia injections. Our Prolia coordinator will contact pt to set him up to begin Prolia in next several months. Next DEXA in 1-2 years.  Normal calcium and GFR 54 at last check.

## 2020-04-16 NOTE — Patient Instructions (Signed)
We will call to set you up with Prolia program for osteoporosis.

## 2020-04-16 NOTE — Progress Notes (Signed)
Patient ID: Shawn Lam, male    DOB: 1951-04-07, 70 y.o.   MRN: [id number]  This visit was conducted in person.  BP 109/68   Pulse 79   Temp 97.8 F (36.6 C) (Temporal)   Ht 5' 9.25" (1.759 m)   Wt 172 lb 12 oz (78.4 kg)   SpO2 97%   BMI 25.33 kg/m    CC: review bone density Subjective:   HPI: Shawn Lam is a 70 y.o. male presenting on 04/16/2020 for Follow-up (Bone Density Results)    70 year old male with osteoporosis ( dx 12/2016 years ago by Ortho ( noted when having back issue given pathologic fractures)), in past has been on forteo for 1 year ( 05/2017 was in osteopenia range)  not sure why it stopped)... recent bone density shows worsening T score despite being on alendronate weekly.  He is compliant with alendronate.  Right femur neck -2.8 now.   12/2/labs Last GFR 54  Calcium 9.0     Relevant past medical, surgical, family and social history reviewed and updated as indicated. Interim medical history since our last visit reviewed. Allergies and medications reviewed and updated. Outpatient Medications Prior to Visit  Medication Sig Dispense Refill  . alendronate (FOSAMAX) 70 MG tablet TAKE 1 TABLET BY MOUTH EVERY 7 DAYS WITH A FULL GLASS OF WATER ON AN EMPTY STOMACH. 12 tablet 3  . atorvastatin (LIPITOR) 40 MG tablet Take 40 mg by mouth daily.    . busPIRone (BUSPAR) 10 MG tablet TAKE 2 TABLETS (20 MG TOTAL) BY MOUTH 3 (THREE) TIMES DAILY. 540 tablet 0  . clopidogrel (PLAVIX) 75 MG tablet TAKE 1 TABLET DAILY 90 tablet 2  . Crisaborole (EUCRISA) 2 % OINT Apply to skin qd-bid 100 g 2  . ferrous sulfate 325 (65 FE) MG tablet TAKE 1 TABLET BY MOUTH 3 TIMES DAILY WITH MEALS. 270 tablet 3  . FLUoxetine (PROZAC) 20 MG capsule TAKE 1 CAPSULE DAILY. TAKE WITH 40MG  TO EQUAL 60MG     ONCE DAILY 90 capsule 1  . FLUoxetine (PROZAC) 40 MG capsule TAKE 1 CAPSULE EVERY       MORNING 90 capsule 1  . furosemide (LASIX) 20 MG tablet Take 1 tablet (20 mg total) by mouth as  needed (As needed for ankle swelling). 90 tablet 3  . gabapentin (NEURONTIN) 100 MG capsule Take 2 capsules (200 mg total) by mouth 2 (two) times daily. 120 capsule 1  . levothyroxine (SYNTHROID) 75 MCG tablet TAKE 1 TABLET (75 MCG TOTAL) BY MOUTH DAILY BEFORE BREAKFAST. 90 tablet 1  . midodrine (PROAMATINE) 10 MG tablet Take 1 tablet (10 mg total) by mouth 3 (three) times daily. 270 tablet 3  . mirtazapine (REMERON) 15 MG tablet TAKE 1 TABLET BY MOUTH EVERYDAY AT BEDTIME 90 tablet 0  . nitroGLYCERIN (NITROSTAT) 0.4 MG SL tablet Place 1 tablet (0.4 mg total) under the tongue every 5 (five) minutes as needed for chest pain. 10 tablet 1  . pantoprazole (PROTONIX) 40 MG tablet TAKE 1 TABLET BY MOUTH TWICE A DAY 180 tablet 1  . tamsulosin (FLOMAX) 0.4 MG CAPS capsule TAKE 2 CAPSULES BY MOUTH EVERY DAY 60 capsule 1  . triamcinolone cream (KENALOG) 0.1 % APPLY 1 APPLICATION TOPICALLY 2 (TWO) TIMES DAILY. AVOID FACE, GROIN, UNDERARMS. 454 g 0  . vitamin B-12 (CYANOCOBALAMIN) 1000 MCG tablet Take 1,000 mcg by mouth daily.     [name] zolpidem (AMBIEN) 10 MG tablet Take 10 mg by mouth at  bedtime as needed.     No facility-administered medications prior to visit.     Per HPI unless specifically indicated in ROS section below Review of Systems Objective:  BP 109/68   Pulse 79   Temp 97.8 F (36.6 C) (Temporal)   Ht 5' 9.25" (1.759 m)   Wt 172 lb 12 oz (78.4 kg)   SpO2 97%   BMI 25.33 kg/m   Wt Readings from Last 3 Encounters:  04/16/20 172 lb 12 oz (78.4 kg)  03/19/20 170 lb 8 oz (77.3 kg)  02/13/20 166 lb 6 oz (75.5 kg)      Physical Exam    Results for orders placed or performed in visit on 03/12/20  PSA, Medicare  Result Value Ref Range   PSA 0.70 0.10 - 4.00 ng/ml  Comprehensive metabolic panel  Result Value Ref Range   Sodium 142 135 - 145 mEq/L   Potassium 4.4 3.5 - 5.1 mEq/L   Chloride 106 96 - 112 mEq/L   CO2 26 19 - 32 mEq/L   Glucose, Bld 106 (H) 70 - 99 mg/dL   BUN 14 6 - 23  mg/dL   Creatinine, Ser 1.33 0.40 - 1.50 mg/dL   Total Bilirubin 0.7 0.2 - 1.2 mg/dL   Alkaline Phosphatase 75 39 - 117 U/L   AST 36 0 - 37 U/L   ALT 21 0 - 53 U/L   Total Protein 6.3 6.0 - 8.3 g/dL   Albumin 3.8 3.5 - 5.2 g/dL   GFR 54.46 (L) >60.00 mL/min   Calcium 9.0 8.4 - 10.5 mg/dL  Lipid panel  Result Value Ref Range   Cholesterol 159 0 - 200 mg/dL   Triglycerides 47.0 0.0 - 149.0 mg/dL   HDL 79.00 >39.00 mg/dL   VLDL 9.4 0.0 - 40.0 mg/dL   LDL Cholesterol 70 0 - 99 mg/dL   Total CHOL/HDL Ratio 2    NonHDL 79.82     This visit occurred during the SARS-CoV-2 public health emergency.  Safety protocols were in place, including screening questions prior to the visit, additional usage of staff PPE, and extensive cleaning of exam room while observing appropriate contact time as indicated for disinfecting solutions.   COVID 19 screen:  No recent travel or known exposure to COVID19 The patient denies respiratory symptoms of COVID 19 at this time. The importance of social distancing was discussed today.   Assessment and Plan Problem List Items Addressed This Visit    Osteoporosis without current pathological fracture - Primary    Chronic worsening Past pathologic fracture.. none now.  Appointment spent  In discussion of bone density, history and reviewing options for treatment.  Patient has selected to stop fosamax as it is not helping and bone density has declined in last 2 years.  We will start Prolia injections. Our Prolia coordinator will contact pt to set him up to begin Prolia in next several months. Next DEXA in 1-2 years.  Normal calcium and GFR 54 at last check.           Total visit time 20 minutes, > 50% spent counseling and cordinating patients care.   [name], MD

## 2020-04-24 ENCOUNTER — Other Ambulatory Visit: Payer: Self-pay

## 2020-04-24 ENCOUNTER — Encounter: Payer: Self-pay | Admitting: Cardiovascular Disease

## 2020-04-24 ENCOUNTER — Ambulatory Visit (INDEPENDENT_AMBULATORY_CARE_PROVIDER_SITE_OTHER): Payer: Medicare Other | Admitting: Cardiovascular Disease

## 2020-04-24 VITALS — BP 112/64 | HR 69 | Ht 72.0 in | Wt 174.0 lb

## 2020-04-24 DIAGNOSIS — I25118 Atherosclerotic heart disease of native coronary artery with other forms of angina pectoris: Secondary | ICD-10-CM | POA: Diagnosis not present

## 2020-04-24 DIAGNOSIS — I951 Orthostatic hypotension: Secondary | ICD-10-CM | POA: Diagnosis not present

## 2020-04-24 DIAGNOSIS — R002 Palpitations: Secondary | ICD-10-CM

## 2020-04-24 DIAGNOSIS — E7800 Pure hypercholesterolemia, unspecified: Secondary | ICD-10-CM

## 2020-04-24 DIAGNOSIS — G4733 Obstructive sleep apnea (adult) (pediatric): Secondary | ICD-10-CM

## 2020-04-24 DIAGNOSIS — U071 COVID-19: Secondary | ICD-10-CM | POA: Diagnosis not present

## 2020-04-24 DIAGNOSIS — R600 Localized edema: Secondary | ICD-10-CM

## 2020-04-24 NOTE — Patient Instructions (Signed)
Medication Instructions:  No changes  If you need a refill on your cardiac medications before your next appointment, please call your pharmacy.    Lab work: No new labs needed   If you have labs (blood work) drawn today and your tests are completely normal, you will receive your results only by: . MyChart Message (if you have MyChart) OR . A paper copy in the mail If you have any lab test that is abnormal or we need to change your treatment, we will call you to review the results.   Testing/Procedures: No new testing needed   Follow-Up: At [facility], you and your health needs are our priority.  As part of our continuing mission to provide you with exceptional heart care, we have created designated Provider Care Teams.  These Care Teams include your primary Cardiologist (physician) and Advanced Practice Providers (APPs -  Physician Assistants and Nurse Practitioners) who all work together to provide you with the care you need, when you need it.  . You will need a follow up appointment in 12 months  . Providers on your designated Care Team:   . Christopher Berge, NP . Ryan Dunn, PA-C . Jacquelyn Visser, PA-C  Any Other Special Instructions Will Be Listed Below (If Applicable).  COVID-19 Vaccine Information can be found at: [email or web address] For questions related to vaccine distribution or appointments, please email [email or web address] or call [phone].     

## 2020-04-24 NOTE — Progress Notes (Addendum)
Cardiology Office Note  Date:  04/24/2020   ID:  [name], DOB [birth year], MRN [id number]  PCP:  Jinny Sanders, MD   Chief Complaint  Patient presents with  . Other    3 month follow up. Meds reviewed verbally with patient.     HPI:  Shawn Lam is a 70 y.o. male with a hx of  CAD s/p PCI of the LAD and RCA,  orthostatic hypotension,  dyslipidemia EF of 50-55% with inferior HK.  lymphedema  mildly dilated aortic root at 38 mm by echo 2018,  moderate OSA Who presents for routine follow-up of his coronary artery disease, orthostatic hypotension  Last seen in clinic July 16, 2019 COVID September 2021 Seen in the emergency room February 07, 2020 for chest pain Diagnosed with bronchitis, chronic respiratory failure with hypoxia  No near syncope or syncope Rare orthostasis, Taking midodrine TID On lasix 2 x a week for ankle swelling  prior fall, fractured his left forearm  Loop monitor in place Downloads reviewed   Echocardiogram with ejection fraction 60 to 65% in August 2020  Lab work reviewed CR 1.33 Total chol 159, LDL 70  EKG personally reviewed by myself on todays visit Shows normal sinus rhythm rate 69 bpm no significant ST-T wave changes rare PVC  Other past medical history reviewed In the hospital 11/2018 GI bleeding and chest pain vasovagal episode 2 days ago in the setting of GI bleed and chronic hypotension EGD with no complications this morning Case discussed with Dr. Allen Norris in detail, Clean-based ulcer no signs of clot or bleeding continue Florinef and midodrine at outpatient   Previous trial of CPAP but was intolerant and then was referred to ENT for consideration of inspire device.  On 12/08/2017 he underwent placement of inspire device without complication.     PMH:   has a past medical history of Anemia, Anxiety, Asymptomatic LV dysfunction, Berger's disease, CAD (coronary artery disease) (06/2005), Depression, Dilated aortic root ([place]),  Hematuria, Hyperlipidemia, Hypothyroidism, Insomnia, Left ureteral stone, Orthostatic hypotension, OSA (obstructive sleep apnea), PVC's (premature ventricular contractions) (07/07/2016), Renal disorder, Rosacea, and Urticaria.  PSH:    Past Surgical History:  Procedure Laterality Date  . CARDIAC CATHETERIZATION  09/06/2013   [place]  . CARDIAC CATHETERIZATION    . COLONOSCOPY WITH PROPOFOL N/A 01/21/2019   Procedure: COLONOSCOPY WITH PROPOFOL;  Surgeon: Lucilla Lame, MD;  Location: [facility];  Service: Endoscopy;  Laterality: N/A;  sleep apnea  . CORONARY ANGIOPLASTY  2004   STENT PLACEMENT  . CYSTOSCOPY WITH RETROGRADE PYELOGRAM, URETEROSCOPY AND STENT PLACEMENT Left 03/19/2014   Procedure: CYSTOSCOPY WITH RETROGRADE PYELOGRAM, URETEROSCOPY AND STENT PLACEMENT;  Surgeon: Junious Dresser, MD;  Location: [facility];  Service: Urology;  Laterality: Left;  . CYSTOSCOPY WITH RETROGRADE PYELOGRAM, URETEROSCOPY AND STENT PLACEMENT Bilateral 05/20/2015   Procedure:  CYSTOSCOPY WITH BILATERAL RETROGRADE PYELOGRAM,RIGHT  DIAGNOSTIC URETEROSCOPY ,LEFT URETEROSCOPY WITH HOLMIUM LASER  AND BILATERAL  STENT PLACEMENT ;  Surgeon: Alexis Frock, MD;  Location: [facility];  Service: Urology;  Laterality: Bilateral;  . DRUG INDUCED ENDOSCOPY N/A 10/20/2017   Procedure: DRUG INDUCED SLEEP ENDOSCOPY;  Surgeon: Melida Quitter, MD;  Location: [facility];  Service: ENT;  Laterality: N/A;  . EP IMPLANTABLE DEVICE N/A 05/13/2016   Procedure: Loop Recorder Insertion;  Surgeon: Deboraha Sprang, MD;  Location: [facility];  Service: Cardiovascular;  Laterality: N/A;  . ESOPHAGOGASTRODUODENOSCOPY (EGD) WITH PROPOFOL N/A 11/30/2018   Procedure: ESOPHAGOGASTRODUODENOSCOPY (EGD)  WITH PROPOFOL;  Surgeon: Jonathon Bellows, MD;  [facility];  Service: Gastroenterology;  Laterality: N/A;  . ESOPHAGOGASTRODUODENOSCOPY (EGD) WITH PROPOFOL N/A 12/01/2018   Procedure: ESOPHAGOGASTRODUODENOSCOPY (EGD) WITH  PROPOFOL;  Surgeon: Lucilla Lame, MD;  Location: [facility];  Service: Endoscopy;  Laterality: N/A;  . HOLMIUM LASER APPLICATION Bilateral 04/16/1094   Procedure: HOLMIUM LASER APPLICATION;  Surgeon: Alexis Frock, MD;  Location: [facility];  Service: Urology;  Laterality: Bilateral;  . IMPLIMENTATION OF A HYPOGLOSSAL NERVE STIMULATOR  12/08/2017   OSA   . LEFT HEART CATHETERIZATION WITH CORONARY ANGIOGRAM N/A 09/06/2013   Procedure: LEFT HEART CATHETERIZATION WITH CORONARY ANGIOGRAM;  Surgeon: Sinclair Grooms, MD;  Location: [facility];  Service: Cardiovascular;  Laterality: N/A;  . LEFT KNEE CAP  SURGERY ABOUT 40 YRS AGO  ? 1970    . STONE EXTRACTION WITH BASKET Left 03/19/2014   Procedure: STONE EXTRACTION WITH BASKET;  Surgeon: Junious Dresser, MD;  Location: [facility];  Service: Urology;  Laterality: Left;    Current Outpatient Medications  Medication Sig Dispense Refill  . alendronate (FOSAMAX) 70 MG tablet TAKE 1 TABLET BY MOUTH EVERY 7 DAYS WITH A FULL GLASS OF WATER ON AN EMPTY STOMACH. 12 tablet 3  . atorvastatin (LIPITOR) 40 MG tablet Take 40 mg by mouth daily.    . busPIRone (BUSPAR) 10 MG tablet TAKE 2 TABLETS (20 MG TOTAL) BY MOUTH 3 (THREE) TIMES DAILY. 540 tablet 0  . clopidogrel (PLAVIX) 75 MG tablet TAKE 1 TABLET DAILY 90 tablet 2  . Crisaborole (EUCRISA) 2 % OINT Apply to skin qd-bid 100 g 2  . ferrous sulfate 325 (65 FE) MG tablet TAKE 1 TABLET BY MOUTH 3 TIMES DAILY WITH MEALS. 270 tablet 3  . FLUoxetine (PROZAC) 20 MG capsule TAKE 1 CAPSULE DAILY. TAKE WITH 40MG  TO EQUAL 60MG     ONCE DAILY 90 capsule 1  . FLUoxetine (PROZAC) 40 MG capsule TAKE 1 CAPSULE EVERY       MORNING 90 capsule 1  . furosemide (LASIX) 20 MG tablet Take 1 tablet (20 mg total) by mouth as needed (As needed for ankle swelling). 90 tablet 3  . gabapentin (NEURONTIN) 100 MG capsule Take 2 capsules (200 mg total) by mouth 2 (two) times daily. 120 capsule 1  . levothyroxine (SYNTHROID) 75 MCG tablet TAKE  1 TABLET (75 MCG TOTAL) BY MOUTH DAILY BEFORE BREAKFAST. 90 tablet 1  . midodrine (PROAMATINE) 10 MG tablet Take 1 tablet (10 mg total) by mouth 3 (three) times daily. 270 tablet 3  . mirtazapine (REMERON) 15 MG tablet TAKE 1 TABLET BY MOUTH EVERYDAY AT BEDTIME 90 tablet 0  . nitroGLYCERIN (NITROSTAT) 0.4 MG SL tablet Place 1 tablet (0.4 mg total) under the tongue every 5 (five) minutes as needed for chest pain. 10 tablet 1  . pantoprazole (PROTONIX) 40 MG tablet TAKE 1 TABLET BY MOUTH TWICE A DAY 180 tablet 1  . tamsulosin (FLOMAX) 0.4 MG CAPS capsule TAKE 2 CAPSULES BY MOUTH EVERY DAY 60 capsule 1  . triamcinolone cream (KENALOG) 0.1 % APPLY 1 APPLICATION TOPICALLY 2 (TWO) TIMES DAILY. AVOID FACE, GROIN, UNDERARMS. 454 g 0  . vitamin B-12 (CYANOCOBALAMIN) 1000 MCG tablet Take 1,000 mcg by mouth daily.     [name] zolpidem (AMBIEN) 10 MG tablet Take 1 tablet (10 mg total) by mouth at bedtime as needed. 30 tablet 0   No current facility-administered medications for this visit.     Allergies:   Codeine and  Tape   Social History:  The patient  reports that he has never smoked. He has never used smokeless tobacco. He reports current alcohol use of about 1.0 standard drink of alcohol per week. He reports that he does not use drugs.   Family History:   family history includes Cancer in his father; Coronary artery disease in his mother; Diabetes in his brother; Heart attack in his mother; Heart disease in his mother; Hypertension in his mother; Lung cancer in his father.    Review of Systems: Review of Systems  Constitutional: Negative.   HENT: Negative.   Respiratory: Negative.   Cardiovascular: Negative.   Gastrointestinal: Negative.   Musculoskeletal: Negative.   Neurological: Negative.   Psychiatric/Behavioral: Negative.   All other systems reviewed and are negative.   PHYSICAL EXAM: VS:  BP 112/64 (BP Location: Left Arm, Patient Position: Sitting, Cuff Size: Normal)   Pulse 69   Ht 6'  (1.829 m)   Wt 174 lb (78.9 kg)   SpO2 98%   BMI 23.60 kg/m  , BMI Body mass index is 23.6 kg/m. GEN: Well nourished, well developed, in no acute distress HEENT: normal Neck: no JVD, carotid bruits, or masses Cardiac: RRR; no murmurs, rubs, or gallops,no edema  Respiratory:  clear to auscultation bilaterally, normal work of breathing GI: soft, nontender, nondistended, + BS MS: no deformity or atrophy Skin: warm and dry, no rash Neuro:  Strength and sensation are intact Psych: euthymic mood, full affect   Recent Labs: 01/23/2020: Magnesium 2.0; TSH 2.000 02/07/2020: B Natriuretic Peptide 29.9; Hemoglobin 13.3; Platelets 307 03/12/2020: ALT 21; BUN 14; Creatinine, Ser 1.33; Potassium 4.4; Sodium 142    Lipid Panel Lab Results  Component Value Date   CHOL 159 03/12/2020   HDL 79.00 03/12/2020   LDLCALC 70 03/12/2020   TRIG 47.0 03/12/2020      Wt Readings from Last 3 Encounters:  04/24/20 174 lb (78.9 kg)  04/16/20 172 lb 12 oz (78.4 kg)  03/19/20 170 lb 8 oz (77.3 kg)      ASSESSMENT AND PLAN:  Problem List Items Addressed This Visit      Cardiology Problems   CAD (coronary artery disease) - Primary (Chronic)   Orthostatic hypotension     Other   OSA (obstructive sleep apnea) (Chronic)    Other Visit Diagnoses    Palpitations       Leg edema       Hypercholesterolemia       COVID-19          1.  OSA -   intolerant to CPAP therapy had the inspire device implanted last year Symptoms stable  2. CAD -  with stable angina s/p remote PCI of the LAD and RCA.   No anginal symptoms, cholesterol at goal  3.  Orthostatic hypotension  On prior clinic visit we recommended taking Lasix sparingly, he continues to take this twice a week Again recommend he take Lasix sparingly No significant leg edema on today's visit requiring Lasix We will continue midodrine 10 3 times daily, recommend he avoid any rapid weight loss  4.  Hyperlipidemia - Numbers at goal,  no medication changes made  Recent COVID-19 pneumonia infection Still using oxygen 3 L at home Lungs clear on exam, not requiring oxygen to ambulate with Could likely wean off in the near future     Total encounter time more than 25 minutes  Greater than 50% was spent in counseling and coordination of care with the  patient    Signed, Esmond Plants, M.D., Ph.D. [place], [place]

## 2020-04-27 NOTE — Assessment & Plan Note (Signed)
Continued oxygen requirement: He is using oxygen when up and doing things.[name] oxygen still dropping down at times to 85%.

## 2020-04-27 NOTE — Assessment & Plan Note (Signed)
Not improving as expected. Given delayed improvement and co morbidities.[name] refer to pulmonary for further eval and recommendations.

## 2020-04-28 ENCOUNTER — Other Ambulatory Visit: Payer: Self-pay | Admitting: Family Medicine

## 2020-04-28 NOTE — Telephone Encounter (Signed)
Pharmacy requests refill on: Atorvastatin 80 mg  LAST REFILL: Unknown LAST OV: 04/16/2020 NEXT OV: 03/26/2021 [facility] [place] [place]

## 2020-04-29 NOTE — Progress Notes (Signed)
First step is to run insurance benefits to see what prolia might cost the pt. Then the process proceeds to a lab and then to the actual NV for the injection. We will start the process as soon as possible and contact the pt with an update as soon as we have information.

## 2020-05-02 ENCOUNTER — Other Ambulatory Visit: Payer: Self-pay | Admitting: Family Medicine

## 2020-05-04 ENCOUNTER — Ambulatory Visit: Payer: Medicare Other | Attending: Internal Medicine

## 2020-05-04 DIAGNOSIS — Z23 Encounter for immunization: Secondary | ICD-10-CM

## 2020-05-04 NOTE — Progress Notes (Signed)
   Covid-19 Vaccination Clinic  Name:  Shawn Lam    MRN: [id number] DOB: 1951/02/10  05/04/2020  [name] was observed post Covid-19 immunization for 15 minutes without incident. He was provided with Vaccine Information Sheet and instruction to access the V-Safe system.   [name] was instructed to call 911 with any severe reactions post vaccine: [name] Difficulty breathing  . Swelling of face and throat  . A fast heartbeat  . A bad rash all over body  . Dizziness and weakness   Immunizations Administered    Name Date Dose VIS Date Route   PFIZER Comrnaty(Gray TOP) Covid-19 Vaccine 05/04/2020  1:25 PM 0.3 mL 03/19/2020 Intramuscular   Manufacturer: [place]   Lot: [id number]   NDC: [phone]

## 2020-05-08 ENCOUNTER — Telehealth: Payer: Self-pay | Admitting: Cardiovascular Disease

## 2020-05-08 ENCOUNTER — Other Ambulatory Visit: Payer: Self-pay | Admitting: Family Medicine

## 2020-05-08 ENCOUNTER — Telehealth: Payer: Self-pay

## 2020-05-08 DIAGNOSIS — R21 Rash and other nonspecific skin eruption: Secondary | ICD-10-CM

## 2020-05-08 MED ORDER — MIDODRINE HCL 10 MG PO TABS: 10.0000 mg | ORAL_TABLET | Freq: Three times a day (TID) | ORAL | 3 refills | Status: DC

## 2020-05-08 MED ORDER — ATORVASTATIN CALCIUM 80 MG PO TABS
80.0000 mg | ORAL_TABLET | Freq: Every day | ORAL | 3 refills | Status: DC
Start: 2020-05-08 — End: 2021-05-05

## 2020-05-08 NOTE — Telephone Encounter (Signed)
*  STAT* If patient is at the pharmacy, call can be transferred to refill team.   1. Which medications need to be refilled? (please list name of each medication and dose if known) midodrine 10 mg po TID  2. Which pharmacy/location (including street and city if local pharmacy) is medication to be sent to? [facility] [place]   3. Do they need a 30 day or 90 day supply? [place]

## 2020-05-08 NOTE — Telephone Encounter (Signed)
Spoke with Shawn Lam.  He states he has been taking Atorvastatin 80 mg daily.   I advised that if that is what he has been taking, then he should continue that dose.  He is [facility] [address]    Refill sent as requested.  Medication list updated.

## 2020-05-08 NOTE — Telephone Encounter (Signed)
Last office visit 04/16/2020 for osteoporosis.  Last refilled 12/20/2019 for 454 g with no refills.  CPE scheduled 03/26/2021.

## 2020-05-08 NOTE — Telephone Encounter (Signed)
Rx request sent to pharmacy.  

## 2020-05-08 NOTE — Telephone Encounter (Signed)
Pt said he just got atorvastatin 80 mg from mail order pharmacy and the instructions changed from taking 80 mg one daily to 80 mg taking 1/2 tab daily. Pt said to his knowledge he has never taken 40 mg of atorvastatin. Per medication hx list on 04/23/19 there was a notation of dose change by [facility] CPh[facility] made notation. Pt did not remember Dr Diona Browner letting him know that his atorvastatin dosage was changing.Pt request Dr Diona Browner to review and pt request cb. Sending note to Dr Diona Browner and [name] CMA.

## 2020-05-08 NOTE — Telephone Encounter (Signed)
Call patient.[name] all notes I see from [place] and cardiology in last 6 months have stated he is on atorvastatin 40 mg daily... if he thinks he has been taking 80 mg all along... okay to fill at that dose.    What ever dose he has been taking to get below results is adequate. Lab Results  Component Value Date   CHOL 159 03/12/2020   HDL 79.00 03/12/2020   LDLCALC 70 03/12/2020   LDLDIRECT 57 06/22/2009   TRIG 47.0 03/12/2020   CHOLHDL 2 03/12/2020

## 2020-05-12 ENCOUNTER — Telehealth: Payer: Self-pay

## 2020-05-12 DIAGNOSIS — M810 Age-related osteoporosis without current pathological fracture: Secondary | ICD-10-CM

## 2020-05-12 NOTE — Telephone Encounter (Signed)
Pt is a new start prolia due to failing fosamax. Per Dr. Diona Browner, start process for pt to start prolia.

## 2020-05-13 NOTE — Telephone Encounter (Signed)
Have submitted benefits will do prior auth if needed once it has been received.

## 2020-05-18 NOTE — Telephone Encounter (Signed)
Opened in error

## 2020-05-20 ENCOUNTER — Other Ambulatory Visit: Payer: Self-pay | Admitting: Family Medicine

## 2020-05-21 NOTE — Telephone Encounter (Signed)
Last office visit 04/16/2020 for osteoporosis.  Last refilled 03/25/2020 for #120 with 1 refill.  CPE scheduled for 03/26/2021.

## 2020-05-27 ENCOUNTER — Other Ambulatory Visit: Payer: Self-pay | Admitting: Family Medicine

## 2020-05-29 NOTE — Telephone Encounter (Signed)
Called and spoke with patient and informed him that after running his benefits, the patient would not have to pay anything out of pocket. Patient verbalized understanding. Lab visit scheduled for 06/01/2020 and NV scheduled for 06/09/2020.

## 2020-06-01 ENCOUNTER — Other Ambulatory Visit: Payer: Self-pay

## 2020-06-01 ENCOUNTER — Other Ambulatory Visit (INDEPENDENT_AMBULATORY_CARE_PROVIDER_SITE_OTHER): Payer: Medicare Other

## 2020-06-01 DIAGNOSIS — M810 Age-related osteoporosis without current pathological fracture: Secondary | ICD-10-CM

## 2020-06-01 LAB — BASIC METABOLIC PANEL
BUN: 15 mg/dL (ref 6–23)
CO2: 28 mEq/L (ref 19–32)
Calcium: 9.1 mg/dL (ref 8.4–10.5)
Chloride: 105 mEq/L (ref 96–112)
Creatinine, Ser: 1.5 mg/dL (ref 0.40–1.50)
GFR: 47.06 mL/min — ABNORMAL LOW (ref >60.00)
Glucose, Bld: 135 mg/dL — ABNORMAL HIGH (ref 70–99)
Potassium: 4 mEq/L (ref 3.5–5.1)
Sodium: 142 mEq/L (ref 135–145)

## 2020-06-05 NOTE — Telephone Encounter (Signed)
Ca normal and CrCl is 51.68mL/min, ok for inj. Prolia scheduled for 06/09/20

## 2020-06-08 ENCOUNTER — Ambulatory Visit (INDEPENDENT_AMBULATORY_CARE_PROVIDER_SITE_OTHER): Payer: Medicare Other

## 2020-06-08 ENCOUNTER — Other Ambulatory Visit: Payer: Self-pay

## 2020-06-08 DIAGNOSIS — M810 Age-related osteoporosis without current pathological fracture: Secondary | ICD-10-CM

## 2020-06-08 MED ORDER — DENOSUMAB 60 MG/ML ~~LOC~~ SOSY
60.0000 mg | PREFILLED_SYRINGE | Freq: Once | SUBCUTANEOUS | Status: AC
  Administered 2020-06-08: 60 mg via SUBCUTANEOUS

## 2020-06-08 NOTE — Progress Notes (Signed)
Per orders of Dr. Lorelei Pont in Dr. Rometta Emery absence, injection of Prolia in R arm given by Randall An. Pt took last Fosamax one week ago. Advised pt to stop taking any further fosamax. Pt verbalized understanding. Patient tolerated injection well.

## 2020-06-09 ENCOUNTER — Ambulatory Visit: Payer: Medicare Other

## 2020-06-14 ENCOUNTER — Other Ambulatory Visit: Payer: Self-pay | Admitting: Family Medicine

## 2020-06-15 NOTE — Telephone Encounter (Signed)
Last office visit 04/16/2020 for Osteoporosis.  Last refilled 04/16/2020 for #30 with no refills.  CPE scheduled for 03/26/2021.

## 2020-07-10 ENCOUNTER — Ambulatory Visit
Admission: RE | Admit: 2020-07-10 | Discharge: 2020-07-10 | Disposition: A | Discharge status: Home / Self Care | Payer: Medicare Other | Source: Ambulatory Visit | Attending: Pulmonary Disease | Admitting: Pulmonary Disease

## 2020-07-10 ENCOUNTER — Other Ambulatory Visit: Payer: Self-pay

## 2020-07-10 ENCOUNTER — Ambulatory Visit: Payer: Medicare Other

## 2020-07-10 DIAGNOSIS — Z8616 Personal history of COVID-19: Secondary | ICD-10-CM | POA: Insufficient documentation

## 2020-07-10 DIAGNOSIS — J849 Interstitial pulmonary disease, unspecified: Secondary | ICD-10-CM | POA: Insufficient documentation

## 2020-07-10 DIAGNOSIS — J8410 Pulmonary fibrosis, unspecified: Secondary | ICD-10-CM | POA: Diagnosis not present

## 2020-07-10 DIAGNOSIS — I2510 Atherosclerotic heart disease of native coronary artery without angina pectoris: Secondary | ICD-10-CM | POA: Diagnosis not present

## 2020-07-10 DIAGNOSIS — J479 Bronchiectasis, uncomplicated: Secondary | ICD-10-CM | POA: Diagnosis not present

## 2020-07-12 ENCOUNTER — Other Ambulatory Visit: Payer: Self-pay | Admitting: Family Medicine

## 2020-07-14 ENCOUNTER — Other Ambulatory Visit: Payer: Self-pay | Admitting: Family Medicine

## 2020-07-14 NOTE — Telephone Encounter (Signed)
Last office visit 04/16/2020 for osteoporosis.  Last refilled 06/15/2020 for #30 with no refills.  CPE scheduled for 03/26/2021.

## 2020-07-15 ENCOUNTER — Telehealth: Payer: Self-pay | Admitting: *Deleted

## 2020-07-15 NOTE — Telephone Encounter (Signed)
Received PA forms form [facility] for Zolpidem 10 mg.  Forms completed and faxed back to [phone].  Awaiting decision.

## 2020-07-15 NOTE — Telephone Encounter (Signed)
PA approved effective 04/11/2020 through 07/15/2021.

## 2020-07-17 ENCOUNTER — Other Ambulatory Visit: Payer: Self-pay | Admitting: Family Medicine

## 2020-07-17 DIAGNOSIS — R21 Rash and other nonspecific skin eruption: Secondary | ICD-10-CM

## 2020-07-19 NOTE — Telephone Encounter (Signed)
Last office visit 04/16/20 for osteoporosis.  Last refilled 05/21/20 for #120 with 1 refill.  CPE scheduled for 03/26/21.

## 2020-07-20 DIAGNOSIS — Z125 Encounter for screening for malignant neoplasm of prostate: Secondary | ICD-10-CM | POA: Diagnosis not present

## 2020-07-20 NOTE — Telephone Encounter (Signed)
Last office visit 04/16/2020 for osteoporosis.  Last refilled 05/08/2020 for 454 g with no refills.  CPE scheduled for 03/26/2021.

## 2020-07-27 DIAGNOSIS — R82998 Other abnormal findings in urine: Secondary | ICD-10-CM | POA: Diagnosis not present

## 2020-07-27 DIAGNOSIS — Z125 Encounter for screening for malignant neoplasm of prostate: Secondary | ICD-10-CM | POA: Diagnosis not present

## 2020-07-27 DIAGNOSIS — R3915 Urgency of urination: Secondary | ICD-10-CM | POA: Diagnosis not present

## 2020-07-27 DIAGNOSIS — N200 Calculus of kidney: Secondary | ICD-10-CM | POA: Diagnosis not present

## 2020-07-28 ENCOUNTER — Other Ambulatory Visit: Payer: Self-pay | Admitting: Family Medicine

## 2020-08-05 ENCOUNTER — Ambulatory Visit (INDEPENDENT_AMBULATORY_CARE_PROVIDER_SITE_OTHER): Payer: Medicare Other | Admitting: Dermatology

## 2020-08-05 ENCOUNTER — Other Ambulatory Visit: Payer: Self-pay

## 2020-08-05 DIAGNOSIS — L570 Actinic keratosis: Secondary | ICD-10-CM

## 2020-08-05 DIAGNOSIS — C4441 Basal cell carcinoma of skin of scalp and neck: Secondary | ICD-10-CM

## 2020-08-05 DIAGNOSIS — L578 Other skin changes due to chronic exposure to nonionizing radiation: Secondary | ICD-10-CM

## 2020-08-05 DIAGNOSIS — I25118 Atherosclerotic heart disease of native coronary artery with other forms of angina pectoris: Secondary | ICD-10-CM

## 2020-08-05 DIAGNOSIS — C44219 Basal cell carcinoma of skin of left ear and external auricular canal: Secondary | ICD-10-CM | POA: Diagnosis not present

## 2020-08-05 DIAGNOSIS — C4491 Basal cell carcinoma of skin, unspecified: Secondary | ICD-10-CM

## 2020-08-05 DIAGNOSIS — D492 Neoplasm of unspecified behavior of bone, soft tissue, and skin: Secondary | ICD-10-CM

## 2020-08-05 HISTORY — DX: Basal cell carcinoma of skin, unspecified: C44.91

## 2020-08-05 NOTE — Progress Notes (Signed)
   Follow-Up Visit   Subjective  Shawn Lam is a 70 y.o. male who presents for the following: Actinic Keratosis (6 month check up. Face, ears. Pink, scaly. ) and Skin Problem (PCP noticed lesion on left side of neck. Tender. ).  The following portions of the chart were reviewed this encounter and updated as appropriate:  Tobacco  Allergies  Meds  Problems  Med Hx  Surg Hx  Fam Hx     Review of Systems: No other skin or systemic complaints except as noted in HPI or Assessment and Plan.  Objective  Well appearing patient in no apparent distress; mood and affect are within normal limits.  A focused examination was performed including head, including the scalp, face, neck, nose, ears, eyelids, and lips. Relevant physical exam findings are noted in the Assessment and Plan.  Objective  face and ears x12 (12): Erythematous thin papules/macules with gritty scale.   Objective  Left Neck Infra auricular: 1.2cm Crusted papule     Assessment & Plan  AK (actinic keratosis) (12) face and ears x12  Destruction of lesion - face and ears x12 Complexity: simple   Destruction method: cryotherapy   Informed consent: discussed and consent obtained   Timeout:  patient name, date of birth, surgical site, and procedure verified Lesion destroyed using liquid nitrogen: Yes   Region frozen until ice ball extended beyond lesion: Yes   Outcome: patient tolerated procedure well with no complications   Post-procedure details: wound care instructions given    Neoplasm of skin Left Neck Infra auricular  Skin / nail biopsy Type of biopsy: tangential   Informed consent: discussed and consent obtained   Timeout: patient name, date of birth, surgical site, and procedure verified   Procedure prep:  Patient was prepped and draped in usual sterile fashion Prep type:  Isopropyl alcohol Anesthesia: the lesion was anesthetized in a standard fashion   Anesthetic:  1% lidocaine w/ epinephrine  1-100,000 buffered w/ 8.4% NaHCO3 Instrument used: flexible razor blade   Hemostasis achieved with: pressure, aluminum chloride and electrodesiccation   Outcome: patient tolerated procedure well   Post-procedure details: sterile dressing applied and wound care instructions given   Dressing type: bandage and petrolatum    Specimen 1 - Surgical pathology Differential Diagnosis: R/O BCC  Check Margins: No 1.2cm Crusted papule  Actinic Damage - chronic, secondary to cumulative UV radiation exposure/sun exposure over time - diffuse scaly erythematous macules with underlying dyspigmentation - Recommend daily broad spectrum sunscreen SPF 30+ to sun-exposed areas, reapply every 2 hours as needed.  - Recommend staying in the shade or wearing long sleeves, sun glasses (UVA+UVB protection) and wide brim hats (4-inch brim around the entire circumference of the hat). - Call for new or changing lesions.  Return in about 6 months (around 02/04/2021) for AKs.   I, Emelia Salisbury, CMA, am acting as scribe for Sarina Ser, MD.  Documentation: I have reviewed the above documentation for accuracy and completeness, and I agree with the above.  Sarina Ser, MD

## 2020-08-05 NOTE — Patient Instructions (Signed)
Wound Care Instructions  1. Cleanse wound gently with soap and water once a day then pat dry with clean gauze. Apply a thing coat of Petrolatum (petroleum jelly, "Vaseline") over the wound (unless you have an allergy to this). We recommend that you use a new, sterile tube of Vaseline. Do not pick or remove scabs. Do not remove the yellow or white "healing tissue" from the base of the wound.  2. Cover the wound with fresh, clean, nonstick gauze and secure with paper tape. You may use Band-Aids in place of gauze and tape if the would is small enough, but would recommend trimming much of the tape off as there is often too much. Sometimes Band-Aids can irritate the skin.  3. You should call the office for your biopsy report after 1 week if you have not already been contacted.  4. If you experience any problems, such as abnormal amounts of bleeding, swelling, significant bruising, significant pain, or evidence of infection, please call the office immediately.  5. FOR ADULT SURGERY PATIENTS: If you need something for pain relief you may take 1 extra strength Tylenol (acetaminophen) AND 2 Ibuprofen (200mg each) together every 4 hours as needed for pain. (do not take these if you are allergic to them or if you have a reason you should not take them.) Typically, you may only need pain medication for 1 to 3 days.    Prior to procedure, discussed risks of blister formation, small wound, skin dyspigmentation, or rare scar following cryotherapy.   If you have any questions or concerns for your doctor, please call our main line at [phone] and press option 4 to reach your doctor's medical assistant. If no one answers, please leave a voicemail as directed and we will return your call as soon as possible. Messages left after 4 pm will be answered the following business day.   You may also send us a message via MyChart. We typically respond to MyChart messages within 1-2 business days.  For prescription  refills, please ask your pharmacy to contact our office. Our fax number is [phone].  If you have an urgent issue when the clinic is closed that cannot wait until the next business day, you can page your doctor at the number below.    Please note that while we do our best to be available for urgent issues outside of office hours, we are not available 24/7.   If you have an urgent issue and are unable to reach us, you may choose to seek medical care at your doctor's office, retail clinic, urgent care center, or emergency room.  If you have a medical emergency, please immediately call 911 or go to the emergency department.  Pager Numbers  - Dr. Kowalski: [phone]  - Dr. Moye: [phone]  - Dr. Stewart: [phone]  In the event of inclement weather, please call our main line at [phone] for an update on the status of any delays or closures.  Dermatology Medication Tips: Please keep the boxes that topical medications come in in order to help keep track of the instructions about where and how to use these. Pharmacies typically print the medication instructions only on the boxes and not directly on the medication tubes.   If your medication is too expensive, please contact our office at [phone] option 4 or send us a message through MyChart.   We are unable to tell what your co-pay for medications will be in advance as this is different depending on your insurance   coverage. However, we may be able to find a substitute medication at lower cost or fill out paperwork to get insurance to cover a needed medication.   If a prior authorization is required to get your medication covered by your insurance company, please allow us 1-2 business days to complete this process.  Drug prices often vary depending on where the prescription is filled and some pharmacies may offer cheaper prices.  The website www.goodrx.com contains coupons for medications through different pharmacies. The  prices here do not account for what the cost may be with help from insurance (it may be cheaper with your insurance), but the website can give you the price if you did not use any insurance.  - You can print the associated coupon and take it with your prescription to the pharmacy.  - You may also stop by our office during regular business hours and pick up a GoodRx coupon card.  - If you need your prescription sent electronically to a different pharmacy, notify our office through [facility] MyChart or by phone at [phone] option 4. 

## 2020-08-06 ENCOUNTER — Encounter: Payer: Self-pay | Admitting: Dermatology

## 2020-08-10 ENCOUNTER — Other Ambulatory Visit: Payer: Self-pay | Admitting: Family Medicine

## 2020-08-10 ENCOUNTER — Telehealth: Payer: Self-pay

## 2020-08-10 NOTE — Telephone Encounter (Signed)
-----   Message from Ralene Bathe, MD sent at 08/10/2020  9:59 AM EDT ----- Diagnosis Skin , left neck infra auricular BASAL CELL CARCINOMA, NODULAR PATTERN  Cancer - BCC Schedule for treatment ([facility])

## 2020-08-10 NOTE — Telephone Encounter (Signed)
Discussed biopsy results, return to the office for treatment of [facility]

## 2020-08-13 ENCOUNTER — Other Ambulatory Visit: Payer: Self-pay | Admitting: Family Medicine

## 2020-08-13 DIAGNOSIS — G4733 Obstructive sleep apnea (adult) (pediatric): Secondary | ICD-10-CM | POA: Diagnosis not present

## 2020-08-13 DIAGNOSIS — F5101 Primary insomnia: Secondary | ICD-10-CM | POA: Diagnosis not present

## 2020-08-13 NOTE — Telephone Encounter (Signed)
Last office visit 04/16/2020 for Osteoporosis.  Last refilled 07/14/2020 for #30 with no refills.   CPE scheduled for 03/26/2021.

## 2020-08-18 ENCOUNTER — Telehealth: Payer: Self-pay | Admitting: Pulmonary Disease

## 2020-08-18 NOTE — Telephone Encounter (Signed)
Called and spoke with Patient.  Dr.Icard's results and recommendations given.  Understanding stated.  Nothing further at this time.

## 2020-08-18 NOTE — Telephone Encounter (Signed)
Garner Nash, DO  08/13/2020 7:22 PM EDT      Marliss Czar, you could let him know that he has CT findings that are consistent with mild scarring related to COVID-19.  Thanks,  BLI   Attempted to call pt but unable to reach. Left message for him to return call.

## 2020-08-18 NOTE — Telephone Encounter (Signed)
Pt returning a phone call. Pt can be reached at [phone]

## 2020-08-20 DIAGNOSIS — H40023 Open angle with borderline findings, high risk, bilateral: Secondary | ICD-10-CM | POA: Diagnosis not present

## 2020-08-20 DIAGNOSIS — Z961 Presence of intraocular lens: Secondary | ICD-10-CM | POA: Diagnosis not present

## 2020-08-20 DIAGNOSIS — H52221 Regular astigmatism, right eye: Secondary | ICD-10-CM | POA: Diagnosis not present

## 2020-08-20 DIAGNOSIS — H5212 Myopia, left eye: Secondary | ICD-10-CM | POA: Diagnosis not present

## 2020-08-20 DIAGNOSIS — H53453 Other localized visual field defect, bilateral: Secondary | ICD-10-CM | POA: Diagnosis not present

## 2020-08-20 DIAGNOSIS — H52222 Regular astigmatism, left eye: Secondary | ICD-10-CM | POA: Diagnosis not present

## 2020-08-20 DIAGNOSIS — D3142 Benign neoplasm of left ciliary body: Secondary | ICD-10-CM | POA: Diagnosis not present

## 2020-08-20 DIAGNOSIS — H5310 Unspecified subjective visual disturbances: Secondary | ICD-10-CM | POA: Diagnosis not present

## 2020-09-14 ENCOUNTER — Other Ambulatory Visit: Payer: Self-pay | Admitting: Family Medicine

## 2020-09-14 NOTE — Telephone Encounter (Signed)
Last office visit 04/16/20 for osteoporosis.  Last refilled 07/20/20 for #120 with 1 refill.  CPE scheduled 03/26/2021.

## 2020-09-17 ENCOUNTER — Encounter: Payer: Self-pay | Admitting: Dermatology

## 2020-09-17 ENCOUNTER — Ambulatory Visit (INDEPENDENT_AMBULATORY_CARE_PROVIDER_SITE_OTHER): Payer: Medicare Other | Admitting: Dermatology

## 2020-09-17 ENCOUNTER — Other Ambulatory Visit: Payer: Self-pay

## 2020-09-17 DIAGNOSIS — L578 Other skin changes due to chronic exposure to nonionizing radiation: Secondary | ICD-10-CM | POA: Diagnosis not present

## 2020-09-17 DIAGNOSIS — L570 Actinic keratosis: Secondary | ICD-10-CM | POA: Diagnosis not present

## 2020-09-17 DIAGNOSIS — I25118 Atherosclerotic heart disease of native coronary artery with other forms of angina pectoris: Secondary | ICD-10-CM

## 2020-09-17 DIAGNOSIS — C4441 Basal cell carcinoma of skin of scalp and neck: Secondary | ICD-10-CM | POA: Diagnosis not present

## 2020-09-17 DIAGNOSIS — L820 Inflamed seborrheic keratosis: Secondary | ICD-10-CM | POA: Diagnosis not present

## 2020-09-17 NOTE — Progress Notes (Signed)
Follow-Up Visit   Subjective  Shawn Lam is a 70 y.o. male who presents for the following: BCC bx proven (L neck infra auricular, pt presents for treatment today) and check spots chest, face, sclap (Scaly areas).  The following portions of the chart were reviewed this encounter and updated as appropriate:   Tobacco  Allergies  Meds  Problems  Med Hx  Surg Hx  Fam Hx      Review of Systems:  No other skin or systemic complaints except as noted in HPI or Assessment and Plan.  Objective  Well appearing patient in no apparent distress; mood and affect are within normal limits.  A focused examination was performed including neck. Relevant physical exam findings are noted in the Assessment and Plan.  L neck infra auricular Pink bx site  chest x 1, scalp x 1, Total x 2 (2) Erythematous keratotic or waxy stuck-on papule or plaque.   face, ears x 10 (10) Pink scaly macules   Assessment & Plan  Basal cell carcinoma (BCC) of skin of neck L neck infra auricular  Destruction of lesion Complexity: extensive   Destruction method: electrodesiccation and curettage   Informed consent: discussed and consent obtained   Timeout:  patient name, date of birth, surgical site, and procedure verified Procedure prep:  Patient was prepped and draped in usual sterile fashion Prep type:  Isopropyl alcohol Anesthesia: the lesion was anesthetized in a standard fashion   Anesthetic:  1% lidocaine w/ epinephrine 1-100,000 buffered w/ 8.4% NaHCO3 Curettage performed in three different directions: Yes   Electrodesiccation performed over the curetted area: Yes   Final wound size (cm):  2.1 Hemostasis achieved with:  pressure, aluminum chloride and electrodesiccation Outcome: patient tolerated procedure well with no complications   Post-procedure details: sterile dressing applied and wound care instructions given   Dressing type: bandage and petrolatum    Inflamed seborrheic keratosis chest  x 1, scalp x 1, Total x 2  Destruction of lesion - chest x 1, scalp x 1, Total x 2 Complexity: simple   Destruction method: cryotherapy   Informed consent: discussed and consent obtained   Timeout:  patient name, date of birth, surgical site, and procedure verified Lesion destroyed using liquid nitrogen: Yes   Region frozen until ice ball extended beyond lesion: Yes   Outcome: patient tolerated procedure well with no complications   Post-procedure details: wound care instructions given    AK (actinic keratosis) (10) face, ears x 10  Destruction of lesion - face, ears x 10 Complexity: simple   Destruction method: cryotherapy   Informed consent: discussed and consent obtained   Timeout:  patient name, date of birth, surgical site, and procedure verified Lesion destroyed using liquid nitrogen: Yes   Region frozen until ice ball extended beyond lesion: Yes   Outcome: patient tolerated procedure well with no complications   Post-procedure details: wound care instructions given    Actinic Damage - chronic, secondary to cumulative UV radiation exposure/sun exposure over time - diffuse scaly erythematous macules with underlying dyspigmentation - Recommend daily broad spectrum sunscreen SPF 30+ to sun-exposed areas, reapply every 2 hours as needed.  - Recommend staying in the shade or wearing long sleeves, sun glasses (UVA+UVB protection) and wide brim hats (4-inch brim around the entire circumference of the hat). - Call for new or changing lesions.  Return for as scheduled for [place] f/u.  I, Othelia Pulling, RMA, am acting as scribe for Sarina Ser, MD . Documentation: I  have reviewed the above documentation for accuracy and completeness, and I agree with the above.  Sarina Ser, MD

## 2020-09-17 NOTE — Patient Instructions (Signed)
If you have any questions or concerns for your doctor, please call our main line at [phone] and press option 4 to reach your doctor's medical assistant. If no one answers, please leave a voicemail as directed and we will return your call as soon as possible. Messages left after 4 pm will be answered the following business day.   You may also send us a message via MyChart. We typically respond to MyChart messages within 1-2 business days.  For prescription refills, please ask your pharmacy to contact our office. Our fax number is [phone].  If you have an urgent issue when the clinic is closed that cannot wait until the next business day, you can page your doctor at the number below.    Please note that while we do our best to be available for urgent issues outside of office hours, we are not available 24/7.   If you have an urgent issue and are unable to reach us, you may choose to seek medical care at your doctor's office, retail clinic, urgent care center, or emergency room.  If you have a medical emergency, please immediately call 911 or go to the emergency department.  Pager Numbers  - Dr. Kowalski: [phone]  - Dr. Moye: [phone]  - Dr. Stewart: [phone]  In the event of inclement weather, please call our main line at [phone] for an update on the status of any delays or closures.  Dermatology Medication Tips: Please keep the boxes that topical medications come in in order to help keep track of the instructions about where and how to use these. Pharmacies typically print the medication instructions only on the boxes and not directly on the medication tubes.   If your medication is too expensive, please contact our office at [phone] option 4 or send us a message through MyChart.   We are unable to tell what your co-pay for medications will be in advance as this is different depending on your insurance coverage. However, we may be able to find a substitute  medication at lower cost or fill out paperwork to get insurance to cover a needed medication.   If a prior authorization is required to get your medication covered by your insurance company, please allow us 1-2 business days to complete this process.  Drug prices often vary depending on where the prescription is filled and some pharmacies may offer cheaper prices.  The website www.goodrx.com contains coupons for medications through different pharmacies. The prices here do not account for what the cost may be with help from insurance (it may be cheaper with your insurance), but the website can give you the price if you did not use any insurance.  - You can print the associated coupon and take it with your prescription to the pharmacy.  - You may also stop by our office during regular business hours and pick up a GoodRx coupon card.  - If you need your prescription sent electronically to a different pharmacy, notify our office through [facility] MyChart or by phone at [phone] option 4.   Wound Care Instructions  Cleanse wound gently with soap and water once a day then pat dry with clean gauze. Apply a thing coat of Petrolatum (petroleum jelly, "Vaseline") over the wound (unless you have an allergy to this). We recommend that you use a new, sterile tube of Vaseline. Do not pick or remove scabs. Do not remove the yellow or white "healing tissue" from the base of the wound.  Cover the   wound with fresh, clean, nonstick gauze and secure with paper tape. You may use Band-Aids in place of gauze and tape if the would is small enough, but would recommend trimming much of the tape off as there is often too much. Sometimes Band-Aids can irritate the skin.  You should call the office for your biopsy report after 1 week if you have not already been contacted.  If you experience any problems, such as abnormal amounts of bleeding, swelling, significant bruising, significant pain, or evidence of infection,  please call the office immediately.  FOR ADULT SURGERY PATIENTS: If you need something for pain relief you may take 1 extra strength Tylenol (acetaminophen) AND 2 Ibuprofen (200mg each) together every 4 hours as needed for pain. (do not take these if you are allergic to them or if you have a reason you should not take them.) Typically, you may only need pain medication for 1 to 3 days.    

## 2020-09-22 ENCOUNTER — Encounter: Payer: Self-pay | Admitting: Dermatology

## 2020-09-29 ENCOUNTER — Other Ambulatory Visit: Payer: Self-pay | Admitting: Cardiovascular Disease

## 2020-09-29 ENCOUNTER — Other Ambulatory Visit: Payer: Self-pay | Admitting: Family Medicine

## 2020-09-29 DIAGNOSIS — F411 Generalized anxiety disorder: Secondary | ICD-10-CM

## 2020-09-29 DIAGNOSIS — F320 Major depressive disorder, single episode, mild: Secondary | ICD-10-CM

## 2020-09-29 DIAGNOSIS — F32A Depression, unspecified: Secondary | ICD-10-CM

## 2020-10-06 ENCOUNTER — Other Ambulatory Visit: Payer: Self-pay | Admitting: Family Medicine

## 2020-10-06 DIAGNOSIS — R21 Rash and other nonspecific skin eruption: Secondary | ICD-10-CM

## 2020-10-07 NOTE — Telephone Encounter (Signed)
Last office visit 04/16/2020 for osteoporosis.  Last refilled:  Triamcinolone 07/20/2020 for 454 g with no refills.  Zolpidem 08/14/2020 for #30 with no refills.  Levothyroxine 07/13/2020 for #90 with no refills.  Last Thyroid labs 07/23/2019.  CPE scheduled 03/26/2021.

## 2020-10-29 ENCOUNTER — Telehealth: Payer: Self-pay

## 2020-10-29 DIAGNOSIS — M810 Age-related osteoporosis without current pathological fracture: Secondary | ICD-10-CM

## 2020-10-29 NOTE — Telephone Encounter (Signed)
Benefits submitted-pending. Next injection due after 12/07/2020

## 2020-10-31 ENCOUNTER — Encounter: Payer: Self-pay | Admitting: Emergency Medicine

## 2020-10-31 ENCOUNTER — Other Ambulatory Visit: Payer: Self-pay

## 2020-10-31 ENCOUNTER — Emergency Department
Admission: EM | Admit: 2020-10-31 | Discharge: 2020-10-31 | Disposition: A | Discharge status: Home / Self Care | Payer: Medicare Other | Attending: Emergency Medicine | Admitting: Emergency Medicine

## 2020-10-31 DIAGNOSIS — Z5321 Procedure and treatment not carried out due to patient leaving prior to being seen by health care provider: Secondary | ICD-10-CM | POA: Insufficient documentation

## 2020-10-31 DIAGNOSIS — W293XXA Contact with powered garden and outdoor hand tools and machinery, initial encounter: Secondary | ICD-10-CM | POA: Diagnosis not present

## 2020-10-31 DIAGNOSIS — Y990 Civilian activity done for income or pay: Secondary | ICD-10-CM | POA: Diagnosis not present

## 2020-10-31 DIAGNOSIS — S6992XA Unspecified injury of left wrist, hand and finger(s), initial encounter: Secondary | ICD-10-CM | POA: Diagnosis present

## 2020-10-31 DIAGNOSIS — S61412A Laceration without foreign body of left hand, initial encounter: Secondary | ICD-10-CM | POA: Insufficient documentation

## 2020-10-31 NOTE — ED Triage Notes (Signed)
Pt reports was clipping hedges and clipped his left hand index and middle finger. Bleeding controlled.

## 2020-10-31 NOTE — ED Provider Notes (Signed)
Emergency Medicine Provider Triage Evaluation Note  Shawn Lam , a 70 y.o. male  was evaluated in triage.  Pt complains of laceration to the left hand. He accidentally cut himself with hedge trimmers at home. Bleeding well controlled at this time. Tdap is current.  Review of Systems  Positive: Left hand laceration. Negative: Difficulty with flexion or extension of fingers of left hand.  Physical Exam  There were no vitals taken for this visit. Gen:   Awake, no distress   Resp:  Normal effort  MSK:   Moves extremities without difficulty. Able to flex and extend injured fingers on the left hand. Other:  Active bleeding to palmar aspect of index and middle finger of left hand.  Medical Decision Making  Medically screening exam initiated at 2:35 PM.  Appropriate orders placed.  Shawn Lam was informed that the remainder of the evaluation will be completed by another provider, this initial triage assessment does not replace that evaluation, and the importance of remaining in the ED until their evaluation is complete.    Shawn Dike, FNP 10/31/20 1532    Shawn Starch, MD 10/31/20 [phone]

## 2020-10-31 NOTE — ED Notes (Signed)
Pt reported to front desk staff that he was leaving to go elsewhere.

## 2020-11-02 ENCOUNTER — Telehealth: Payer: Self-pay

## 2020-11-02 DIAGNOSIS — S61211A Laceration without foreign body of left index finger without damage to nail, initial encounter: Secondary | ICD-10-CM | POA: Diagnosis not present

## 2020-11-02 DIAGNOSIS — S61213A Laceration without foreign body of left middle finger without damage to nail, initial encounter: Secondary | ICD-10-CM | POA: Diagnosis not present

## 2020-11-02 NOTE — Telephone Encounter (Signed)
See phone notes from Access Nurse and Randall An RN.

## 2020-11-02 NOTE — Telephone Encounter (Signed)
Pt c/o laceration to middle and index fingers on L hand from hedge trimmers on 7/23 in which he went to ER and was triaged but LWBS. Pt reports 1/2" long laceration but unsure how deep. Advised pt no apts available today and that he should be seen at [facility] for assessment. Pt denies any symptoms of infections at this time. Gave pt UC information for [place]. Pt said he would go to [facility] today. Advised if any problems or anything is needed to contact this office. Pt verbalized understanding.

## 2020-11-02 NOTE — Telephone Encounter (Signed)
PLEASE NOTE: All timestamps contained within this report are represented as [place] Standard Time. CONFIDENTIALTY NOTICE: This fax transmission is intended only for the addressee. It contains information that is legally privileged, confidential or otherwise protected from use or disclosure. If you are not the intended recipient, you are strictly prohibited from reviewing, disclosing, copying using or disseminating any of this information or taking any action in reliance on or regarding this information. If you have received this fax in error, please notify us immediately by telephone so that we can arrange for its return to [place]. Phone: [phone], Toll-Free: [phone], Fax: [phone] Page: 1 of 2 Call Id: [id number] [facility] [facility] - Client TELEPHONE ADVICE RECORD [facility] Patient Name: Shawn Lam Gender: Male DOB: May 07, 1950 Age: 70 Y 28 M 12 D Return Phone Number: [phone] (Primary), [phone] (Secondary) Address: City/ State/ Zip: [place]  [address] Client [facility] - Client Client Site [place] - Day Physician [name] - MD Contact Type Call Who Is Calling Patient / Member / Family / Caregiver Call Type Triage / Clinical Relationship To Patient Self Return Phone Number [phone] (Primary) Chief Complaint Finger Injury Reason for Call Symptomatic / Request for [place] states, patient needs an appt. Cut his fingers this weekend. Went to ER, was not seen. Translation No Nurse Assessment Nurse: Jearld Pies, RN, Lovena Le Date/Time Eilene Ghazi Time): 11/02/2020 1:17:46 PM Confirm and document reason for call. If symptomatic, describe symptoms. ---Caller states, patient needs an appt. Cut his fingers this weekend. Index and middle fingers were cut deep by hedge cutters Went to ER, was not seen. Have bandaged it up. Denies bleeding at this time,  fever, or any other symptoms at this time. Does the patient have any new or worsening symptoms? ---Yes Will a triage be completed? ---Yes Related visit to physician within the last 2 weeks? ---N/A Does the PT have any chronic conditions? (i.e. diabetes, asthma, this includes High risk factors for pregnancy, etc.) ---Yes List chronic conditions. ---Blood thinner Is this a behavioral health or substance abuse call? ---No Guidelines Guideline Title Affirmed Question Affirmed Notes Nurse Date/Time (Eastern Time) Finger Injury Skin is split open or gaping (or length > 1/2 inch or 12 mm) Jearld Pies, RN, Lovena Le 11/02/2020 1:19:15 PM Disp. Time Eilene Ghazi Time) Disposition Final User 11/02/2020 1:23:06 PM Go to ED Now Yes Jearld Pies, RN, Lovena Le PLEASE NOTE: All timestamps contained within this report are represented as [place] Standard Time. CONFIDENTIALTY NOTICE: This fax transmission is intended only for the addressee. It contains information that is legally privileged, confidential or otherwise protected from use or disclosure. If you are not the intended recipient, you are strictly prohibited from reviewing, disclosing, copying using or disseminating any of this information or taking any action in reliance on or regarding this information. If you have received this fax in error, please notify us immediately by telephone so that we can arrange for its return to [place]. Phone: [phone], Toll-Free: [phone], Fax: [phone] Page: 2 of 2 Call Id: [id number] [place] Disagree/Comply Comply Caller Understands Yes PreDisposition Did not know what to do Care Advice Given Per Guideline * Leave now. Drive carefully. * Go to the ED at _________[facility] Hospital. * You need to be seen in the Emergency Department. GO TO ED NOW: Comments User: Malissa Hippo, RN Date/Time Eilene Ghazi Time): 11/02/2020 1:24:13 PM Contacted office and spoke with Raquel Sarna. Made aware of ED dispo and pt's refusal. She states "I will  get a  message to nurse/doctor and they will call him back directly." Pt made aware and verbalizes understanding. Referrals GO TO FACILITY REFUSED

## 2020-11-03 NOTE — Telephone Encounter (Signed)
Noted agree with dispo 

## 2020-11-05 NOTE — Telephone Encounter (Signed)
Benefits received, cost OOP for patient is $0. Patient advised. Lab appointment made for 12/01/20 and [place] for 12/08/20.

## 2020-11-05 NOTE — Addendum Note (Signed)
Addended by: Kris Mouton on: 11/05/2020 10:16 AM   Modules accepted: Orders

## 2020-11-09 ENCOUNTER — Other Ambulatory Visit: Payer: Self-pay | Admitting: Family Medicine

## 2020-11-09 NOTE — Telephone Encounter (Signed)
Last office visit 04/16/2020 for osteoporosis.  Last refilled 10/07/2020 for #30 with no refills.  CPE scheduled for 03/26/2021.

## 2020-11-10 DIAGNOSIS — S61213A Laceration without foreign body of left middle finger without damage to nail, initial encounter: Secondary | ICD-10-CM | POA: Diagnosis not present

## 2020-11-10 DIAGNOSIS — S61211A Laceration without foreign body of left index finger without damage to nail, initial encounter: Secondary | ICD-10-CM | POA: Diagnosis not present

## 2020-11-11 ENCOUNTER — Other Ambulatory Visit: Payer: Self-pay | Admitting: Family Medicine

## 2020-11-11 NOTE — Telephone Encounter (Signed)
Last office visit 04/16/2020 for osteoporosis.  Last refilled 09/14/2020 for #120 with 1 refill.  CPE scheduled for 03/26/2021.

## 2020-11-12 ENCOUNTER — Other Ambulatory Visit: Payer: Self-pay | Admitting: Family Medicine

## 2020-11-12 DIAGNOSIS — F411 Generalized anxiety disorder: Secondary | ICD-10-CM

## 2020-11-12 DIAGNOSIS — F32A Depression, unspecified: Secondary | ICD-10-CM

## 2020-11-12 DIAGNOSIS — F320 Major depressive disorder, single episode, mild: Secondary | ICD-10-CM

## 2020-11-23 DIAGNOSIS — I951 Orthostatic hypotension: Secondary | ICD-10-CM | POA: Diagnosis not present

## 2020-11-23 DIAGNOSIS — N183 Chronic kidney disease, stage 3 unspecified: Secondary | ICD-10-CM | POA: Diagnosis not present

## 2020-12-01 ENCOUNTER — Other Ambulatory Visit: Payer: Self-pay | Admitting: Family Medicine

## 2020-12-01 ENCOUNTER — Other Ambulatory Visit: Payer: Self-pay

## 2020-12-01 ENCOUNTER — Other Ambulatory Visit (INDEPENDENT_AMBULATORY_CARE_PROVIDER_SITE_OTHER): Payer: Medicare Other

## 2020-12-01 DIAGNOSIS — M810 Age-related osteoporosis without current pathological fracture: Secondary | ICD-10-CM

## 2020-12-01 LAB — BASIC METABOLIC PANEL
BUN: 14 mg/dL (ref 6–23)
CO2: 28 mEq/L (ref 19–32)
Calcium: 9.7 mg/dL (ref 8.4–10.5)
Chloride: 104 mEq/L (ref 96–112)
Creatinine, Ser: 1.47 mg/dL (ref 0.40–1.50)
GFR: 48.05 mL/min — ABNORMAL LOW (ref >60.00)
Glucose, Bld: 100 mg/dL — ABNORMAL HIGH (ref 70–99)
Potassium: 4.2 mEq/L (ref 3.5–5.1)
Sodium: 141 mEq/L (ref 135–145)

## 2020-12-02 NOTE — Telephone Encounter (Signed)
CrCl 50.99 mL/min. Calcium 9.7 on 12/01/20 Ok to proceed

## 2020-12-04 ENCOUNTER — Other Ambulatory Visit: Payer: Self-pay | Admitting: Family Medicine

## 2020-12-07 ENCOUNTER — Other Ambulatory Visit: Payer: Self-pay | Admitting: Family Medicine

## 2020-12-07 NOTE — Telephone Encounter (Signed)
Last office visit 04/16/2020 for Osteoporosis.  Last refilled 11/10/20 for #30 with no refills .  CPE scheduled for 03/26/2021.

## 2020-12-08 ENCOUNTER — Other Ambulatory Visit: Payer: Self-pay

## 2020-12-08 ENCOUNTER — Ambulatory Visit (INDEPENDENT_AMBULATORY_CARE_PROVIDER_SITE_OTHER): Payer: Medicare Other

## 2020-12-08 DIAGNOSIS — M816 Localized osteoporosis [Lequesne]: Secondary | ICD-10-CM

## 2020-12-08 MED ORDER — DENOSUMAB 60 MG/ML ~~LOC~~ SOSY
60.0000 mg | PREFILLED_SYRINGE | Freq: Once | SUBCUTANEOUS | Status: AC
  Administered 2020-12-08: 60 mg via SUBCUTANEOUS

## 2020-12-08 NOTE — Progress Notes (Signed)
Per orders of Dr. Diona Browner, injection of Prolia was given by Ophelia Shoulder CMA. Patient tolerated injection well.

## 2021-01-01 DIAGNOSIS — Z23 Encounter for immunization: Secondary | ICD-10-CM | POA: Diagnosis not present

## 2021-01-02 ENCOUNTER — Other Ambulatory Visit: Payer: Self-pay | Admitting: Family Medicine

## 2021-01-06 ENCOUNTER — Other Ambulatory Visit: Payer: Self-pay | Admitting: Family Medicine

## 2021-01-06 NOTE — Telephone Encounter (Signed)
Last office visit 04/16/2020 for osteoporosis.  Last refilled 12/08/2020 for #30 with no refills.  CPE scheduled 03/26/2021.

## 2021-01-17 ENCOUNTER — Other Ambulatory Visit: Payer: Self-pay | Admitting: Family Medicine

## 2021-01-19 ENCOUNTER — Telehealth: Payer: Self-pay | Admitting: Family Medicine

## 2021-01-19 NOTE — Telephone Encounter (Signed)
Last refilled on 11/12/20 # 120 with 1 refill LOV 04/16/20 discuss osteoporosis treatment  Next appointment on 03/26/21 CPE

## 2021-01-19 NOTE — Telephone Encounter (Signed)
  Encourage patient to contact the pharmacy for refills or they can request refills through [place]:  Please schedule appointment if longer than 1 year  NEXT APPOINTMENT DATE:03/19/21  MEDICATION:gabapentin  Is the patient out of medication? yes  [address]  Let patient know to contact pharmacy at the end of the day to make sure medication is ready.  Please notify patient to allow 48-72 hours to process  CLINICAL FILLS OUT ALL BELOW:   LAST REFILL:  QTY:  REFILL DATE:    OTHER COMMENTS:    Okay for refill?  Please advise

## 2021-01-19 NOTE — Telephone Encounter (Signed)
See refill request note and mychart message-duplicate requests

## 2021-01-21 ENCOUNTER — Telehealth (INDEPENDENT_AMBULATORY_CARE_PROVIDER_SITE_OTHER): Payer: Medicare Other | Admitting: Family Medicine

## 2021-01-21 ENCOUNTER — Other Ambulatory Visit: Payer: Self-pay | Admitting: Family Medicine

## 2021-01-21 ENCOUNTER — Encounter: Payer: Self-pay | Admitting: Family Medicine

## 2021-01-21 VITALS — BP 106/66 | HR 72 | Temp 96.5°F | Ht 69.25 in | Wt 175.0 lb

## 2021-01-21 DIAGNOSIS — I25118 Atherosclerotic heart disease of native coronary artery with other forms of angina pectoris: Secondary | ICD-10-CM

## 2021-01-21 DIAGNOSIS — R051 Acute cough: Secondary | ICD-10-CM

## 2021-01-21 MED ORDER — AZITHROMYCIN 250 MG PO TABS: ORAL_TABLET | ORAL | 0 refills | Status: DC

## 2021-01-21 MED ORDER — HYDROCODONE BIT-HOMATROP MBR 5-1.5 MG/5ML PO SOLN: 5.0000 mL | Freq: Every evening | ORAL | 0 refills | Status: DC | PRN

## 2021-01-21 NOTE — Patient Instructions (Addendum)
Covid home test.. call ASAP with results.  Hold Ambien while on bedtime cough suppressant.  Rest, fluids.  Can fill antibiotics if not improving in next 3-4 days.  Go to ER if severe shortness of breath.  

## 2021-01-21 NOTE — Progress Notes (Signed)
VIRTUAL VISIT Due to national recommendations of social distancing due to [place] 19, a virtual visit is felt to be most appropriate for this patient at this time.   I connected with the patient on 01/21/21 at  9:20 AM EDT by virtual telehealth platform and verified that I am speaking with the correct person using two identifiers.   I discussed the limitations, risks, security and privacy concerns of performing an evaluation and management service by  virtual telehealth platform and the availability of in person appointments. I also discussed with the patient that there may be a patient responsible charge related to this service. The patient expressed understanding and agreed to proceed.  Patient location: Home Provider Location: [place] Participants: [name] and [name]   Chief Complaint  Patient presents with   Cough    C/o cough and congestion.  Cough worse at night.  Started 4-5 days ago.  Denies any other sxs.     History of Present Illness:  70 year old male with   history of CAD and new onset chest congestion and cough.   Started 4-5 days ago.  No nasal congestion, no fever.   No face pain,  No myalgia.  No ST. No ear pain.  Worse at night, trouble sleeping given cough   He has been using Mucinex .. hasn't helped.   No asthma, no COPD   No sick contacts.   No antibiotics in last month.   COVID 19 screen COVID testing: no testing COVID vaccine: Has had primary series and one booster COVID exposure: No recent travel or known exposure to COVID19  The importance of social distancing was discussed today.    Review of Systems  Constitutional:  Negative for chills and fever.  HENT:  Negative for congestion and ear pain.   Eyes:  Negative for pain and redness.  Respiratory:  Positive for cough. Negative for shortness of breath.   Cardiovascular:  Negative for chest pain, palpitations and leg swelling.  Gastrointestinal:  Negative for abdominal  pain, blood in stool, constipation, diarrhea, nausea and vomiting.  Genitourinary:  Negative for dysuria.  Musculoskeletal:  Negative for falls and myalgias.  Skin:  Negative for rash.  Neurological:  Negative for dizziness.  Psychiatric/Behavioral:  Negative for depression. The patient is not nervous/anxious.      Past Medical History:  Diagnosis Date   Anemia    Anxiety    Asymptomatic LV dysfunction    50-55% by echo 06/2016   Basal cell carcinoma 08/05/2020   left neck infa auricular, EDC 09/17/20   Berger's disease    CAD (coronary artery disease) 06/2005   PCI of LAD and RCA   Depression    Dilated aortic root (HCC)    4cm at sinus of Valsalva   Hematuria    Hyperlipidemia    Hypothyroidism    Insomnia    Left ureteral stone    Orthostatic hypotension    negative tilt table   OSA (obstructive sleep apnea)    moderate with AHI 17/hr, uses CPAP nightly(01/15/19 - has Inspire implant)   PVC's (premature ventricular contractions) 07/07/2016   Renal disorder    PT IS SEEING DR. Lorrene Reid- NEPHROLOGIST   Rosacea    Urticaria     reports that he has never smoked. He has never used smokeless tobacco. He reports current alcohol use of about 1.0 standard drink per week. He reports that he does not use drugs.   Current Outpatient Medications:  atorvastatin (LIPITOR) 80 MG tablet, Take 1 tablet (80 mg total) by mouth daily., Disp: 90 tablet, Rfl: 3   busPIRone (BUSPAR) 10 MG tablet, TAKE 2 TABLETS BY MOUTH 3 TIMES DAILY., Disp: 540 tablet, Rfl: 1   clopidogrel (PLAVIX) 75 MG tablet, TAKE 1 TABLET DAILY, Disp: 90 tablet, Rfl: 1   Crisaborole (EUCRISA) 2 % OINT, Apply to skin qd-bid, Disp: 100 g, Rfl: 2   ferrous sulfate 325 (65 FE) MG tablet, TAKE 1 TABLET BY MOUTH 3 TIMES DAILY WITH MEALS., Disp: 270 tablet, Rfl: 3   FLUoxetine (PROZAC) 20 MG capsule, TAKE 1 CAPSULE DAILY. TAKE WITH 40MG  TO EQUAL 60MG     ONCE DAILY, Disp: 90 capsule, Rfl: 1   FLUoxetine (PROZAC) 40 MG capsule,  TAKE 1 CAPSULE EVERY       MORNING, Disp: 90 capsule, Rfl: 1   furosemide (LASIX) 20 MG tablet, TAKE 1 TABLET BY MOUTH EVERY DAY, Disp: 90 tablet, Rfl: 1   gabapentin (NEURONTIN) 100 MG capsule, TAKE TWO CAPSULES BY MOUTH TWICE A DAY, Disp: 120 capsule, Rfl: 1   levothyroxine (SYNTHROID) 75 MCG tablet, TAKE 1 TABLET BY MOUTH EVERY DAY BEFORE BREAKFAST, Disp: 90 tablet, Rfl: 0   midodrine (PROAMATINE) 10 MG tablet, Take 1 tablet (10 mg total) by mouth 3 (three) times daily., Disp: 270 tablet, Rfl: 3   mirtazapine (REMERON) 15 MG tablet, TAKE 1 TABLET BY MOUTH EVERYDAY AT BEDTIME, Disp: 90 tablet, Rfl: 1   nitroGLYCERIN (NITROSTAT) 0.4 MG SL tablet, Place 1 tablet (0.4 mg total) under the tongue every 5 (five) minutes as needed for chest pain., Disp: 10 tablet, Rfl: 1   pantoprazole (PROTONIX) 40 MG tablet, TAKE 1 TABLET BY MOUTH TWICE A DAY, Disp: 180 tablet, Rfl: 1   tamsulosin (FLOMAX) 0.4 MG CAPS capsule, TAKE 2 CAPSULES BY MOUTH EVERY DAY, Disp: 180 capsule, Rfl: 1   triamcinolone cream (KENALOG) 0.1 %, APPLY 1 APPLICATION TOPICALLY 2 (TWO) TIMES DAILY. AVOID FACE, GROIN, UNDERARMS., Disp: 454 g, Rfl: 0   vitamin B-12 (CYANOCOBALAMIN) 1000 MCG tablet, Take 1,000 mcg by mouth daily. , Disp: , Rfl:    zolpidem (AMBIEN) 10 MG tablet, TAKE 1 TABLET BY MOUTH EVERY DAY AT BEDTIME AS NEEDED, Disp: 30 tablet, Rfl: 0   Observations/Objective: Blood pressure 106/66, pulse 72, temperature (!) 96.5 F (35.8 C), height 5' 9.25" (1.759 m), weight 175 lb (79.4 kg).  Physical Exam  Physical Exam Constitutional:      General: The patient is not in acute distress. Pulmonary:     Effort: Pulmonary effort is normal. No respiratory distress.  Neurological:     Mental Status: The patient is alert and oriented to person, place, and time.  Psychiatric:        Mood and Affect: Mood normal.        Behavior: Behavior normal.   Assessment and Plan Problem List Items Addressed This Visit     Acute cough -  Primary    Covid home test.. call ASAP with results.  Hold Ambien while on bedtime cough suppressant.  Rest, fluids.  Can fill antibiotics if not improving in next 3-4 days.  Go to ER if severe shortness of breath.       Meds ordered this encounter  Medications   HYDROcodone bit-homatropine (HYCODAN) 5-1.5 MG/5ML syrup    Sig: Take 5 mLs by mouth at bedtime as needed for cough.    Dispense:  120 mL    Refill:  0   azithromycin (ZITHROMAX)  250 MG tablet    Sig: 2 tab po x 1 day then 1 tab po daily    Dispense:  6 tablet    Refill:  0    FILL if not improving after 3-4 more days and COVID test negative. VOID after 02/28/21      I discussed the assessment and treatment plan with the patient. The patient was provided an opportunity to ask questions and all were answered. The patient agreed with the plan and demonstrated an understanding of the instructions.   The patient was advised to call back or seek an in-person evaluation if the symptoms worsen or if the condition fails to improve as anticipated.     [name], MD

## 2021-01-21 NOTE — Telephone Encounter (Signed)
Thanks

## 2021-02-02 ENCOUNTER — Other Ambulatory Visit: Payer: Self-pay | Admitting: Family Medicine

## 2021-02-02 NOTE — Telephone Encounter (Signed)
Last office visit 01/21/2021 (virtual) for cough.  Last refilled 01/06/2021 for #30 with no refills. CPE scheduled 03/26/2021.

## 2021-02-04 ENCOUNTER — Other Ambulatory Visit: Payer: Self-pay

## 2021-02-04 ENCOUNTER — Ambulatory Visit (INDEPENDENT_AMBULATORY_CARE_PROVIDER_SITE_OTHER): Payer: Medicare Other | Admitting: Dermatology

## 2021-02-04 DIAGNOSIS — D489 Neoplasm of uncertain behavior, unspecified: Secondary | ICD-10-CM

## 2021-02-04 DIAGNOSIS — L570 Actinic keratosis: Secondary | ICD-10-CM

## 2021-02-04 DIAGNOSIS — I25118 Atherosclerotic heart disease of native coronary artery with other forms of angina pectoris: Secondary | ICD-10-CM

## 2021-02-04 DIAGNOSIS — L578 Other skin changes due to chronic exposure to nonionizing radiation: Secondary | ICD-10-CM

## 2021-02-04 HISTORY — DX: Actinic keratosis: L57.0

## 2021-02-04 MED ORDER — FLUOROURACIL 5 % EX CREA
TOPICAL_CREAM | Freq: Two times a day (BID) | CUTANEOUS | 1 refills | Status: DC
Start: 2021-02-04 — End: 2021-03-18

## 2021-02-04 NOTE — Progress Notes (Signed)
Follow-Up Visit   Subjective  Shawn Lam is a 71 y.o. male who presents for the following: Follow-up (6 month follow up on aks at face and ears. Patient reports a spot at right side of neck that he would like checked. ).  The following portions of the chart were reviewed this encounter and updated as appropriate:  Tobacco  Allergies  Meds  Problems  Med Hx  Surg Hx  Fam Hx     Review of Systems: No other skin or systemic complaints except as noted in HPI or Assessment and Plan.  Objective  Well appearing patient in no apparent distress; mood and affect are within normal limits.  A focused examination was performed including face, ears, neck. Relevant physical exam findings are noted in the Assessment and Plan.  right lateral neck Pink flat papule      scalp , forehead , ears, x 6 (6) Erythematous thin papules/macules with gritty scale.   Assessment & Plan  Neoplasm of uncertain behavior right lateral neck  Epidermal / dermal shaving  Lesion diameter (cm):  1.1 Informed consent: discussed and consent obtained   Timeout: patient name, date of birth, surgical site, and procedure verified   Procedure prep:  Patient was prepped and draped in usual sterile fashion Prep type:  Isopropyl alcohol Anesthesia: the lesion was anesthetized in a standard fashion   Anesthetic:  1% lidocaine w/ epinephrine 1-100,000 buffered w/ 8.4% NaHCO3 Instrument used: flexible razor blade   Hemostasis achieved with: pressure, aluminum chloride and electrodesiccation   Outcome: patient tolerated procedure well   Post-procedure details: sterile dressing applied and wound care instructions given   Dressing type: bandage and petrolatum    Destruction of lesion Complexity: extensive   Destruction method: electrodesiccation and curettage   Informed consent: discussed and consent obtained   Timeout:  patient name, date of birth, surgical site, and procedure verified Procedure prep:   Patient was prepped and draped in usual sterile fashion Prep type:  Isopropyl alcohol Anesthesia: the lesion was anesthetized in a standard fashion   Anesthetic:  1% lidocaine w/ epinephrine 1-100,000 buffered w/ 8.4% NaHCO3 Curettage performed in three different directions: Yes   Electrodesiccation performed over the curetted area: Yes   Final wound size (cm):  1.1 Hemostasis achieved with:  pressure, aluminum chloride and electrodesiccation Outcome: patient tolerated procedure well with no complications   Post-procedure details: sterile dressing applied and wound care instructions given   Dressing type: bandage and petrolatum    Specimen 1 - Surgical pathology Differential Diagnosis: r/o bcc   Check Margins: No  R/o bcc   Actinic keratosis (6) scalp , forehead , ears, x 6  Actinic keratoses are precancerous spots that appear secondary to cumulative UV radiation exposure/sun exposure over time. They are chronic with expected duration over 1 year. A portion of actinic keratoses will progress to squamous cell carcinoma of the skin. It is not possible to reliably predict which spots will progress to skin cancer and so treatment is recommended to prevent development of skin cancer.  Recommend daily broad spectrum sunscreen SPF 30+ to sun-exposed areas, reapply every 2 hours as needed.  Recommend staying in the shade or wearing long sleeves, sun glasses (UVA+UVB protection) and wide brim hats (4-inch brim around the entire circumference of the hat). Call for new or changing lesions.  Destruction of lesion - scalp , forehead , ears, x 6 Complexity: simple   Destruction method: cryotherapy   Informed consent: discussed and consent obtained  Timeout:  patient name, date of birth, surgical site, and procedure verified Lesion destroyed using liquid nitrogen: Yes   Region frozen until ice ball extended beyond lesion: Yes   Outcome: patient tolerated procedure well with no complications    Post-procedure details: wound care instructions given    fluorouracil (EFUDEX) 5 % cream - scalp , forehead , ears, x 6 Apply topically 2 (two) times daily. Use for 7 days at forehead and temples  Actinic Damage - Severe, confluent actinic changes with pre-cancerous actinic keratoses  - Severe, chronic, not at goal, secondary to cumulative UV radiation exposure over time - diffuse scaly erythematous macules and papules with underlying dyspigmentation - Discussed Prescription "Field Treatment" for Severe, Chronic Confluent Actinic Changes with Pre-Cancerous Actinic Keratoses Field treatment involves treatment of an entire area of skin that has confluent Actinic Changes (Sun/ Ultraviolet light damage) and PreCancerous Actinic Keratoses by method of PhotoDynamic Therapy (PDT) and/or prescription Topical Chemotherapy agents such as 5-fluorouracil, 5-fluorouracil/calcipotriene, and/or imiquimod.  The purpose is to decrease the number of clinically evident and subclinical PreCancerous lesions to prevent progression to development of skin cancer by chemically destroying early precancer changes that may or may not be visible.  It has been shown to reduce the risk of developing skin cancer in the treated area. As a result of treatment, redness, scaling, crusting, and open sores may occur during treatment course. One or more than one of these methods may be used and may have to be used several times to control, suppress and eliminate the PreCancerous changes. Discussed treatment course, expected reaction, and possible side effects. - Recommend daily broad spectrum sunscreen SPF 30+ to sun-exposed areas, reapply every 2 hours as needed.  - Staying in the shade or wearing long sleeves, sun glasses (UVA+UVB protection) and wide brim hats (4-inch brim around the entire circumference of the hat) are also recommended. - Call for new or changing lesions.  - Start 5-fluorouracil/calcipotriene cream twice a day for 7  days to affected areas including forehead and temple. Prescription sent to [facility]. Patient advised they will receive an email to purchase the medication online and have it sent to their home. Patient provided with handout reviewing treatment course and side effects and advised to call or message [place] on MyChart with any concerns.  Return for 6 months , ak followup. IRuthell Rummage, CMA, am acting as scribe for Sarina Ser, MD. Documentation: I have reviewed the above documentation for accuracy and completeness, and I agree with the above.  Sarina Ser, MD

## 2021-02-04 NOTE — Patient Instructions (Addendum)
Start 5-fluorouracil/calcipotriene cream twice a day for 7 days to affected areas including forehead and temple. Prescription sent to [facility]. Patient provided with contact information for pharmacy and advised the pharmacy will mail the prescription to their home. Patient provided with handout reviewing treatment course and side effects and advised to call or message [place] on MyChart with any concerns.  5-Fluorouracil/Calcipotriene Patient Education   Actinic keratoses are the dry, red scaly spots on the skin caused by sun damage. A portion of these spots can turn into skin cancer with time, and treating them can help prevent development of skin cancer.   Treatment of these spots requires removal of the defective skin cells. There are various ways to remove actinic keratoses, including freezing with liquid nitrogen, treatment with creams, or treatment with a blue light procedure in the office.   5-fluorouracil cream is a topical cream used to treat actinic keratoses. It works by interfering with the growth of abnormal fast-growing skin cells, such as actinic keratoses. These cells peel off and are replaced by healthy ones.   5-fluorouracil/calcipotriene is a combination of the 5-fluorouracil cream with a vitamin D analog cream called calcipotriene. The calcipotriene alone does not treat actinic keratoses. However, when it is combined with 5-fluorouracil, it helps the 5-fluorouracil treat the actinic keratoses much faster so that the same results can be achieved with a much shorter treatment time.  INSTRUCTIONS FOR 5-FLUOROURACIL/CALCIPOTRIENE CREAM:   5-fluorouracil/calcipotriene cream typically only needs to be used for 4-7 days. A thin layer should be applied twice a day to the treatment areas recommended by your physician.   If your physician prescribed you separate tubes of 5-fluourouracil and calcipotriene, apply a thin layer of 5-fluorouracil followed by a thin layer of calcipotriene.    Avoid contact with your eyes, nostrils, and mouth. Do not use 5-fluorouracil/calcipotriene cream on infected or open wounds.   You will develop redness, irritation and some crusting at areas where you have pre-cancer damage/actinic keratoses. IF YOU DEVELOP PAIN, BLEEDING, OR SIGNIFICANT CRUSTING, STOP THE TREATMENT EARLY - you have already gotten a good response and the actinic keratoses should clear up well.  Wash your hands after applying 5-fluorouracil 5% cream on your skin.   A moisturizer or sunscreen with a minimum SPF 30 should be applied each morning.   Once you have finished the treatment, you can apply a thin layer of Vaseline twice a day to irritated areas to soothe and calm the areas more quickly. If you experience significant discomfort, contact your physician.  For some patients it is necessary to repeat the treatment for best results.  SIDE EFFECTS: When using 5-fluorouracil/calcipotriene cream, you may have mild irritation, such as redness, dryness, swelling, or a mild burning sensation. This usually resolves within 2 weeks. The more actinic keratoses you have, the more redness and inflammation you can expect during treatment. Eye irritation has been reported rarely. If this occurs, please let us know.  If you have any trouble using this cream, please call the office. If you have any other questions about this information, please do not hesitate to ask me before you leave the office.  Biopsy Wound Care Instructions  Leave the original bandage on for 24 hours if possible.  If the bandage becomes soaked or soiled before that time, it is OK to remove it and examine the wound.  A small amount of post-operative bleeding is normal.  If excessive bleeding occurs, remove the bandage, place gauze over the site and apply continuous  pressure (no peeking) over the area for 30 minutes. If this does not work, please call our clinic as soon as possible or page your doctor if it is after hours.    Once a day, cleanse the wound with soap and water. It is fine to shower. If a thick crust develops you may use a Q-tip dipped into dilute hydrogen peroxide (mix 1:1 with water) to dissolve it.  Hydrogen peroxide can slow the healing process, so use it only as needed.    After washing, apply petroleum jelly (Vaseline) or an antibiotic ointment if your doctor prescribed one for you, followed by a bandage.    For best healing, the wound should be covered with a layer of ointment at all times. If you are not able to keep the area covered with a bandage to hold the ointment in place, this may mean re-applying the ointment several times a day.  Continue this wound care until the wound has healed and is no longer open.   Itching and mild discomfort is normal during the healing process. However, if you develop pain or severe itching, please call our office.   If you have any discomfort, you can take Tylenol (acetaminophen) or ibuprofen as directed on the bottle. (Please do not take these if you have an allergy to them or cannot take them for another reason).  Some redness, tenderness and white or yellow material in the wound is normal healing.  If the area becomes very sore and red, or develops a thick yellow-green material (pus), it may be infected; please notify us.    If you have stitches, return to clinic as directed to have the stitches removed. You will continue wound care for 2-3 days after the stitches are removed.   Wound healing continues for up to one year following surgery. It is not unusual to experience pain in the scar from time to time during the interval.  If the pain becomes severe or the scar thickens, you should notify the office.    A slight amount of redness in a scar is expected for the first six months.  After six months, the redness will fade and the scar will soften and fade.  The color difference becomes less noticeable with time.  If there are any problems, return for a  post-op surgery check at your earliest convenience.  To improve the appearance of the scar, you can use silicone scar gel, cream, or sheets (such as Mederma or Serica) every night for up to one year. These are available over the counter (without a prescription).  Please call our office at [phone] for any questions or concerns.

## 2021-02-05 ENCOUNTER — Encounter: Payer: Self-pay | Admitting: Dermatology

## 2021-02-09 ENCOUNTER — Telehealth: Payer: Self-pay

## 2021-02-09 NOTE — Telephone Encounter (Signed)
Discussed biopsy results with pt  °

## 2021-02-09 NOTE — Telephone Encounter (Signed)
-----   Message from Ralene Bathe, MD sent at 02/06/2021  1:16 PM EDT ----- Diagnosis Skin , right lateral neck ACTINIC KERATOSIS  PreCancer  Already treated Recheck next visit

## 2021-02-15 ENCOUNTER — Other Ambulatory Visit: Payer: Self-pay | Admitting: Family Medicine

## 2021-02-18 ENCOUNTER — Telehealth: Payer: Self-pay | Admitting: Family Medicine

## 2021-02-18 NOTE — Telephone Encounter (Signed)
Forms are in Dr. Rometta Emery office in box to review and sign if appropriate.

## 2021-02-18 NOTE — Telephone Encounter (Signed)
Ali from [facility] called wants to know was fax received  for pt primary immune deficiency panel stated it need's to be sign would like a call back #[phone]

## 2021-02-19 NOTE — Telephone Encounter (Signed)
Ali from [facility] called checking on status of forms. Deatra Canter states that if possible could the forms be signed by 02/24/21.

## 2021-02-19 NOTE — Telephone Encounter (Signed)
Can you verify with pt that he wants to do this testing.

## 2021-02-19 NOTE — Telephone Encounter (Signed)
Spoke with Shawn Lam.  He states this is not anything that he has requested.  Order marked denied and faxed back to [facility] at [phone].

## 2021-02-22 ENCOUNTER — Telehealth: Payer: Self-pay | Admitting: Family Medicine

## 2021-02-22 NOTE — Progress Notes (Signed)
Subjective:   Shawn Lam is a 70 y.o. male who presents for Medicare Annual/Subsequent preventive examination.  I connected with Shawn Lam today by telephone and verified that I am speaking with the correct person using two identifiers. Location patient: home Location provider: work Persons participating in the virtual visit: patient, Marine scientist.    I discussed the limitations, risks, security and privacy concerns of performing an evaluation and management service by telephone and the availability of in person appointments. I also discussed with the patient that there may be a patient responsible charge related to this service. The patient expressed understanding and verbally consented to this telephonic visit.    Interactive audio and video telecommunications were attempted between this provider and patient, however failed, due to patient having technical difficulties OR patient did not have access to video capability.  We continued and completed visit with audio only.  Some vital signs may be absent or patient reported.   Time Spent with patient on telephone encounter: 25 minutes  Review of Systems     Cardiac Risk Factors include: advanced age (>25men, >4 women);dyslipidemia     Objective:    Today's Vitals   02/26/21 1141  Weight: 174 lb (78.9 kg)  Height: 6' (1.829 m)   Body mass index is 23.6 kg/m.  Advanced Directives 02/26/2021 02/25/2020 02/07/2020 02/07/2020 01/06/2020 01/06/2020 01/04/2020  Does Patient Have a Medical Advance Directive? No No No No No Yes No  Type of Advance Directive - - - - - Press photographer -  Does patient want to make changes to medical advance directive? - - - - No - Patient declined No - Patient declined -  Copy of [place] in Chart? - - - - - No - copy requested -  Would patient like information on creating a medical advance directive? Yes (MAU/Ambulatory/Procedural Areas - Information given) No - Patient  declined - - No - Patient declined - -  Some encounter information is confidential and restricted. Go to Review Flowsheets activity to see all data.    Current Medications (verified) Outpatient Encounter Medications as of 02/26/2021  Medication Sig   atorvastatin (LIPITOR) 80 MG tablet Take 1 tablet (80 mg total) by mouth daily.   azithromycin (ZITHROMAX) 250 MG tablet 2 tab po x 1 day then 1 tab po daily   busPIRone (BUSPAR) 10 MG tablet TAKE 2 TABLETS BY MOUTH 3 TIMES DAILY.   clopidogrel (PLAVIX) 75 MG tablet TAKE 1 TABLET DAILY   Crisaborole (EUCRISA) 2 % OINT Apply to skin qd-bid   ferrous sulfate 325 (65 FE) MG tablet TAKE 1 TABLET BY MOUTH 3 TIMES DAILY WITH MEALS.   fluorouracil (EFUDEX) 5 % cream Apply topically 2 (two) times daily. Use for 7 days at forehead and temples   FLUoxetine (PROZAC) 20 MG capsule TAKE 1 CAPSULE DAILY. TAKE WITH 40MG  TO EQUAL 60MG     ONCE DAILY   FLUoxetine (PROZAC) 40 MG capsule TAKE 1 CAPSULE EVERY       MORNING   furosemide (LASIX) 20 MG tablet TAKE 1 TABLET BY MOUTH EVERY DAY   gabapentin (NEURONTIN) 100 MG capsule TAKE TWO CAPSULES BY MOUTH TWICE A DAY   HYDROcodone bit-homatropine (HYCODAN) 5-1.5 MG/5ML syrup Take 5 mLs by mouth at bedtime as needed for cough.   levothyroxine (SYNTHROID) 75 MCG tablet TAKE 1 TABLET BY MOUTH EVERY DAY BEFORE BREAKFAST   midodrine (PROAMATINE) 10 MG tablet Take 1 tablet (10 mg total) by mouth 3 (  three) times daily.   mirtazapine (REMERON) 15 MG tablet TAKE 1 TABLET BY MOUTH EVERYDAY AT BEDTIME   nitroGLYCERIN (NITROSTAT) 0.4 MG SL tablet Place 1 tablet (0.4 mg total) under the tongue every 5 (five) minutes as needed for chest pain.   pantoprazole (PROTONIX) 40 MG tablet TAKE 1 TABLET BY MOUTH TWICE A DAY   tamsulosin (FLOMAX) 0.4 MG CAPS capsule TAKE 2 CAPSULES BY MOUTH EVERY DAY   triamcinolone cream (KENALOG) 0.1 % APPLY 1 APPLICATION TOPICALLY 2 (TWO) TIMES DAILY. AVOID FACE, GROIN, UNDERARMS.   vitamin B-12  (CYANOCOBALAMIN) 1000 MCG tablet Take 1,000 mcg by mouth daily.    zolpidem (AMBIEN) 10 MG tablet TAKE 1 TABLET BY MOUTH EVERY DAY AT BEDTIME AS NEEDED   No facility-administered encounter medications on file as of 02/26/2021.    Allergies (verified) Codeine and Tape   History: Past Medical History:  Diagnosis Date   Actinic keratosis 02/04/2021   right lateral neck   Anemia    Anxiety    Asymptomatic LV dysfunction    50-55% by echo 06/2016   Basal cell carcinoma 08/05/2020   left neck infa auricular, EDC 09/17/20   Berger's disease    CAD (coronary artery disease) 06/2005   PCI of LAD and RCA   Depression    Dilated aortic root (HCC)    4cm at sinus of Valsalva   Hematuria    Hyperlipidemia    Hypothyroidism    Insomnia    Left ureteral stone    Orthostatic hypotension    negative tilt table   OSA (obstructive sleep apnea)    moderate with AHI 17/hr, uses CPAP nightly(01/15/19 - has Inspire implant)   PVC's (premature ventricular contractions) 07/07/2016   Renal disorder    PT IS SEEING DR. Lorrene Reid- NEPHROLOGIST   Rosacea    Urticaria    Past Surgical History:  Procedure Laterality Date   CARDIAC CATHETERIZATION  09/06/2013   [facility]   CARDIAC CATHETERIZATION     COLONOSCOPY WITH PROPOFOL N/A 01/21/2019   Procedure: COLONOSCOPY WITH PROPOFOL;  Surgeon: Lucilla Lame, MD;  Location: [place];  Service: Endoscopy;  Laterality: N/A;  sleep apnea   CORONARY ANGIOPLASTY  2004   STENT PLACEMENT   CYSTOSCOPY WITH RETROGRADE PYELOGRAM, URETEROSCOPY AND STENT PLACEMENT Left 03/19/2014   Procedure: CYSTOSCOPY WITH RETROGRADE PYELOGRAM, URETEROSCOPY AND STENT PLACEMENT;  Surgeon: Junious Dresser, MD;  Location: [facility];  Service: Urology;  Laterality: Left;   CYSTOSCOPY WITH RETROGRADE PYELOGRAM, URETEROSCOPY AND STENT PLACEMENT Bilateral 05/20/2015   Procedure:  CYSTOSCOPY WITH BILATERAL RETROGRADE PYELOGRAM,RIGHT  DIAGNOSTIC URETEROSCOPY ,LEFT URETEROSCOPY WITH  HOLMIUM LASER  AND BILATERAL  STENT PLACEMENT ;  Surgeon: Alexis Frock, MD;  Location: [facility];  Service: Urology;  Laterality: Bilateral;   DRUG INDUCED ENDOSCOPY N/A 10/20/2017   Procedure: DRUG INDUCED SLEEP ENDOSCOPY;  Surgeon: Melida Quitter, MD;  Location: [place];  Service: ENT;  Laterality: N/A;   EP IMPLANTABLE DEVICE N/A 05/13/2016   Procedure: Loop Recorder Insertion;  Surgeon: Deboraha Sprang, MD;  Location: [place] [facility];  Service: Cardiovascular;  Laterality: N/A;   ESOPHAGOGASTRODUODENOSCOPY (EGD) WITH PROPOFOL N/A 11/30/2018   Procedure: ESOPHAGOGASTRODUODENOSCOPY (EGD) WITH PROPOFOL;  Surgeon: Jonathon Bellows, MD;  [facility]: [facility];  Service: Gastroenterology;  Laterality: N/A;   ESOPHAGOGASTRODUODENOSCOPY (EGD) WITH PROPOFOL N/A 12/01/2018   Procedure: ESOPHAGOGASTRODUODENOSCOPY (EGD) WITH PROPOFOL;  Surgeon: Lucilla Lame, MD;  Location: [facility];  Service: Endoscopy;  Laterality: N/A;   HOLMIUM LASER  APPLICATION Bilateral 12/13/8544   Procedure: HOLMIUM LASER APPLICATION;  Surgeon: Alexis Frock, MD;  Location: [facility];  Service: Urology;  Laterality: Bilateral;   IMPLIMENTATION OF A HYPOGLOSSAL NERVE STIMULATOR  12/08/2017   OSA    LEFT HEART CATHETERIZATION WITH CORONARY ANGIOGRAM N/A 09/06/2013   Procedure: LEFT HEART CATHETERIZATION WITH CORONARY ANGIOGRAM;  Surgeon: Sinclair Grooms, MD;  Location: [facility];  Service: Cardiovascular;  Laterality: N/A;   LEFT KNEE CAP  SURGERY ABOUT 40 YRS AGO  ? 1970     STONE EXTRACTION WITH BASKET Left 03/19/2014   Procedure: STONE EXTRACTION WITH BASKET;  Surgeon: Junious Dresser, MD;  Location: [facility];  Service: Urology;  Laterality: Left;   Family History  Problem Relation Age of Onset   Coronary artery disease Mother    Heart attack Mother    Heart disease Mother    Hypertension Mother    Cancer Father        lung and colon   Lung cancer Father        smoker   Diabetes Brother    Social  History   Socioeconomic History   Marital status: Married    Spouse name: Not on file   Number of children: Not on file   Years of education: Not on file   Highest education level: Not on file  Occupational History   Occupation: retired Print production planner  Tobacco Use   Smoking status: Never   Smokeless tobacco: Never  Vaping Use   Vaping Use: Never used  Substance and Sexual Activity   Alcohol use: Not Currently    Alcohol/week: 1.0 standard drink    Types: 1 Cans of beer per week   Drug use: No   Sexual activity: Yes    Partners: Female    Birth control/protection: None  Other Topics Concern   Not on file  Social History Narrative   Regular exercise--yes--jog 5 miles 6 days a week   Social Determinants of Radio broadcast assistant Strain: Low Risk    Difficulty of Paying Living Expenses: Not hard at all  Food Insecurity: No Food Insecurity   Worried About Charity fundraiser in the Last Year: Never true   Arboriculturist in the Last Year: Never true  Transportation Needs: No Transportation Needs   Lack of Transportation (Medical): No   Lack of Transportation (Non-Medical): No  Physical Activity: Sufficiently Active   Days of Exercise per Week: 7 days   Minutes of Exercise per Session: 50 min  Stress: No Stress Concern Present   Feeling of Stress : Not at all  Social Connections: Moderately Isolated   Frequency of Communication with Friends and Family: Twice a week   Frequency of Social Gatherings with Friends and Family: More than three times a week   Attends Religious Services: Never   Marine scientist or Organizations: No   Attends Music therapist: Never   Marital Status: Married    Tobacco Counseling Counseling given: Not Answered   Clinical Intake:  Pre-visit preparation completed: Yes  Pain : No/denies pain     BMI - recorded: 23.59 Nutritional Status: BMI of 19-24  Normal Nutritional Risks: None Diabetes: No  How often do you  need to have someone help you when you read instructions, pamphlets, or other written materials from your doctor or pharmacy?: 1 - Never  Diabetic?No  Interpreter Needed?: No  Information entered by :: Orrin Brigham LPN  Activities of Daily Living In your present state of health, do you have any difficulty performing the following activities: 02/26/2021  Hearing? N  Vision? N  Difficulty concentrating or making decisions? N  Walking or climbing stairs? N  Dressing or bathing? N  Doing errands, shopping? N  Preparing Food and eating ? N  Using the Toilet? N  In the past six months, have you accidently leaked urine? N  Do you have problems with loss of bowel control? N  Managing your Medications? N  Managing your Finances? N  Housekeeping or managing your Housekeeping? N  Some recent data might be hidden    Patient Care Team: Jinny Sanders, MD as PCP - General Rockey Situ Kathlene November, MD as PCP - Cardiology (Cardiology) Charlton Haws, [facility] as Pharmacist (Pharmacist)  Indicate any recent Medical Services you may have received from other than [facility] providers in the past year (date may be approximate).     Assessment:   This is a routine wellness examination for [place].  Hearing/Vision screen Hearing Screening - Comments:: No issues Vision Screening - Comments:: Last exam 02/25/21, Dr. Matilde Sprang , wears glasses  Dietary issues and exercise activities discussed: Current Exercise Habits: Home exercise routine, Type of exercise: walking, Time (Minutes): 45, Frequency (Times/Week): 7, Weekly Exercise (Minutes/Week): 315, Intensity: Moderate   Goals Addressed             This Visit's Progress    Patient Stated       Would like to continue to walk.       Depression Screen PHQ 2/9 Scores 02/26/2021 02/25/2020 08/22/2019 02/21/2019 02/13/2018 02/10/2017 02/09/2016  PHQ - 2 Score 0 0 0 0 0 0 6  PHQ- 9 Score - 0 - 0 0 - 19  Some encounter information is confidential and  restricted. Go to Review Flowsheets activity to see all data.    Fall Risk Fall Risk  02/26/2021 02/25/2020 02/21/2019 02/27/2018 02/13/2018  Falls in the past year? 0 0 1 1 0  Comment - - passed out due to low hemoglobin [place] Telephone Survey: data to providers prior to load -  Number falls in past yr: 0 0 0 1 -  Comment - - - [facility] Telephone Survey Actual Response = 40 -  Injury with Fall? 0 0 1 0 -  Comment - - Patient fractured his arm - -  Risk for fall due to : No Fall Risks Medication side effect Other (Comment) - -  Risk for fall due to: Comment - - low hemoglobin - -  Follow up Falls prevention discussed Falls evaluation completed;Falls prevention discussed Falls evaluation completed;Falls prevention discussed - -    FALL RISK PREVENTION PERTAINING TO THE HOME:  Any stairs in or around the home? No  If so, are there any without handrails? No  Home free of loose throw rugs in walkways, pet beds, electrical cords, etc? Yes  Adequate lighting in your home to reduce risk of falls? Yes   ASSISTIVE DEVICES UTILIZED TO PREVENT FALLS:  Life alert? No  Use of a cane, walker or w/c? No  Grab bars in the bathroom? Yes  Shower chair or bench in shower? Yes  Elevated toilet seat or a handicapped toilet? Yes   TIMED UP AND GO:  Was the test performed? No , visit completed over the phone.    Cognitive Function: Normal cognitive status assessed by this Nurse Health Advisor. No abnormalities found.   MMSE - Mini Mental State Exam 02/25/2020  02/21/2019 02/13/2018  Orientation to time 5 5 5   Orientation to Place 5 5 5   Registration 3 3 3   Attention/ Calculation 5 5 0  Recall 3 3 2   Recall-comments - - unable to recall 1 of 3 words  Language- name 2 objects - - 0  Language- repeat 1 1 1   Language- follow 3 step command - - 3  Language- read & follow direction - - 0  Write a sentence - - 0  Copy design - - 0  Total score - - 19        Immunizations Immunization History   Administered Date(s) Administered   Fluad Quad(high Dose 65+) 12/04/2018, 01/01/2020   Influenza Split 01/04/2011, 01/27/2012   Influenza Whole 01/09/2008, 03/04/2009, 12/27/2009   Influenza, High Dose Seasonal PF 01/04/2021   Influenza,inj,Quad PF,6+ Mos 01/31/2013, 01/31/2014, 02/03/2015, 02/09/2016, 01/20/2017, 01/23/2018   PFIZER Comirnaty(Gray Top)Covid-19 Tri-Sucrose Vaccine 05/04/2020   PFIZER(Purple Top)SARS-COV-2 Vaccination 06/01/2019, 06/25/2019   Pneumococcal Conjugate-13 02/09/2016   Pneumococcal Polysaccharide-23 02/23/2017   Td 06/05/2007   Tdap 03/13/2019   Zoster, Live 01/25/2011    TDAP status: Up to date  Flu Vaccine status: Up to date  Pneumococcal vaccine status: Up to date  Covid-19 vaccine status: Declined, Education has been provided regarding the importance of this vaccine but patient still declined. Advised may receive this vaccine at local pharmacy or Health Dept.or vaccine clinic. Aware to provide a copy of the vaccination record if obtained from local pharmacy or Health Dept. Verbalized acceptance and understanding.  Qualifies for Shingles Vaccine? Yes   Zostavax completed Yes   Shingrix Completed?: No.    Education has been provided regarding the importance of this vaccine. Patient has been advised to call insurance company to determine out of pocket expense if they have not yet received this vaccine. Advised may also receive vaccine at local pharmacy or Health Dept. Verbalized acceptance and understanding.  Screening Tests Health Maintenance  Topic Date Due   Zoster Vaccines- Shingrix (1 of 2) Never done   COVID-19 Vaccine (4 - Booster for Pfizer series) 06/29/2020   DEXA SCAN  04/02/2022   COLONOSCOPY (Pts 45-70yrs Insurance coverage will need to be confirmed)  01/21/2024   TETANUS/TDAP  03/12/2029   Pneumonia Vaccine 39+ Years old  Completed   INFLUENZA VACCINE  Completed   Hepatitis C Screening  Completed   HPV VACCINES  Aged Out    Health  Maintenance  Health Maintenance Due  Topic Date Due   Zoster Vaccines- Shingrix (1 of 2) Never done   COVID-19 Vaccine (4 - Booster for Pfizer series) 06/29/2020    Colorectal cancer screening: Type of screening: Colonoscopy. Completed 01/21/19. Repeat every 5 years  Lung Cancer Screening: (Low Dose CT Chest recommended if Age 61-80 years, 30 pack-year currently smoking OR have quit w/in 15years.) does not qualify.    Additional Screening:  Hepatitis C Screening: does qualify; Completed 02/03/15  Vision Screening: Recommended annual ophthalmology exams for early detection of glaucoma and other disorders of the eye. Is the patient up to date with their annual eye exam?  Yes  Who is the provider or what is the name of the office in which the patient attends annual eye exams? Dr. Matilde Sprang   Dental Screening: Recommended annual dental exams for proper oral hygiene  Community Resource Referral / Chronic Care Management: CRR required this visit?  No   CCM required this visit?  No      Plan:     I  have personally reviewed and noted the following in the patient's chart:   Medical and social history Use of alcohol, tobacco or illicit drugs  Current medications and supplements including opioid prescriptions. Patient is not currently taking opioid prescriptions. Functional ability and status Nutritional status Physical activity Advanced directives List of other physicians Hospitalizations, surgeries, and ER visits in previous 12 months Vitals Screenings to include cognitive, depression, and falls Referrals and appointments  In addition, I have reviewed and discussed with patient certain preventive protocols, quality metrics, and best practice recommendations. A written personalized care plan for preventive services as well as general preventive health recommendations were provided to patient.   Due to this being a telephonic visit, the after visit summary with patients  personalized plan was offered to patient via mail or my-chart. Patient would like to access on my-chart.    Loma Messing, LPN  [id number]   Nurse Health Advisor  Nurse Notes: None

## 2021-02-22 NOTE — Chronic Care Management (AMB) (Signed)
  Chronic Care Management   Note  02/22/2021 Name: [name] MRN: [id number] DOB: 1950-10-22  Shawn Lam is a 70 y.o. year old male who is a primary care patient of Bedsole, Amy E, MD. I reached out to Shawn Lam by phone today in response to a referral sent by Shawn Lam [facility] PCP, Jinny Sanders, MD.   Shawn Lam was given information about Chronic Care Management services today including:  CCM service includes personalized support from designated clinical staff supervised by his physician, including individualized plan of care and coordination with other care providers 24/7 contact phone numbers for assistance for urgent and routine care needs. Service will only be billed when office clinical staff spend 20 minutes or more in a month to coordinate care. Only one practitioner may furnish and bill the service in a calendar month. The patient may stop CCM services at any time (effective at the end of the month) by phone call to the office staff.   Patient agreed to services and verbal consent obtained.   Follow up plan:PT [place]

## 2021-02-23 DIAGNOSIS — H40013 Open angle with borderline findings, low risk, bilateral: Secondary | ICD-10-CM | POA: Diagnosis not present

## 2021-02-23 DIAGNOSIS — H52223 Regular astigmatism, bilateral: Secondary | ICD-10-CM | POA: Diagnosis not present

## 2021-02-23 DIAGNOSIS — H524 Presbyopia: Secondary | ICD-10-CM | POA: Diagnosis not present

## 2021-02-23 DIAGNOSIS — H5213 Myopia, bilateral: Secondary | ICD-10-CM | POA: Diagnosis not present

## 2021-02-23 DIAGNOSIS — Z961 Presence of intraocular lens: Secondary | ICD-10-CM | POA: Diagnosis not present

## 2021-02-23 DIAGNOSIS — H53453 Other localized visual field defect, bilateral: Secondary | ICD-10-CM | POA: Diagnosis not present

## 2021-02-23 DIAGNOSIS — D3142 Benign neoplasm of left ciliary body: Secondary | ICD-10-CM | POA: Diagnosis not present

## 2021-02-24 DIAGNOSIS — R051 Acute cough: Secondary | ICD-10-CM | POA: Insufficient documentation

## 2021-02-24 NOTE — Assessment & Plan Note (Signed)
Covid home test.. call ASAP with results.  Hold Ambien while on bedtime cough suppressant.  Rest, fluids.  Can fill antibiotics if not improving in next 3-4 days.  Go to ER if severe shortness of breath.

## 2021-02-26 ENCOUNTER — Ambulatory Visit (INDEPENDENT_AMBULATORY_CARE_PROVIDER_SITE_OTHER): Payer: Medicare Other

## 2021-02-26 VITALS — Ht 72.0 in | Wt 174.0 lb

## 2021-02-26 DIAGNOSIS — Z0000 Encounter for general adult medical examination without abnormal findings: Secondary | ICD-10-CM | POA: Diagnosis not present

## 2021-02-26 NOTE — Patient Instructions (Signed)
[name] , Thank you for taking time to complete your Medicare Wellness Visit. I appreciate your ongoing commitment to your health goals. Please review the following plan we discussed and let me know if I can assist you in the future.   Screening recommendations/referrals: Colonoscopy: up to date, completed 01/21/19, Due 01/21/24 Recommended yearly ophthalmology/optometry visit for glaucoma screening and checkup Recommended yearly dental visit for hygiene and checkup  Vaccinations: Influenza vaccine: up to date Pneumococcal vaccine: up to date Tdap vaccine: up to date, completed 03/13/19, due 03/12/29 Shingles vaccine: Discuss with your local pharmacy Covid-19: newest booster available at your local pharmacy  Advanced directives: information available at your next appointment  Conditions/risks identified: see problem list  Next appointment: Follow up in one year for your annual wellness visit. 03/02/22 @ 1:15pm, this will be a telephone visit  Preventive Care 55 Years and Older, Male Preventive care refers to lifestyle choices and visits with your health care provider that can promote health and wellness. What does preventive care include? A yearly physical exam. This is also called an annual well check. Dental exams once or twice a year. Routine eye exams. Ask your health care provider how often you should have your eyes checked. Personal lifestyle choices, including: Daily care of your teeth and gums. Regular physical activity. Eating a healthy diet. Avoiding tobacco and drug use. Limiting alcohol use. Practicing safe sex. Taking low doses of aspirin every day. Taking vitamin and mineral supplements as recommended by your health care provider. What happens during an annual well check? The services and screenings done by your health care provider during your annual well check will depend on your age, overall health, lifestyle risk factors, and family history of  disease. Counseling  Your health care provider may ask you questions about your: Alcohol use. Tobacco use. Drug use. Emotional well-being. Home and relationship well-being. Sexual activity. Eating habits. History of falls. Memory and ability to understand (cognition). Work and work Statistician. Screening  You may have the following tests or measurements: Height, weight, and BMI. Blood pressure. Lipid and cholesterol levels. These may be checked every 5 years, or more frequently if you are over 32 years old. Skin check. Lung cancer screening. You may have this screening every year starting at age 56 if you have a 30-pack-year history of smoking and currently smoke or have quit within the past 15 years. Fecal occult blood test (FOBT) of the stool. You may have this test every year starting at age 8. Flexible sigmoidoscopy or colonoscopy. You may have a sigmoidoscopy every 5 years or a colonoscopy every 10 years starting at age 34. Prostate cancer screening. Recommendations will vary depending on your family history and other risks. Hepatitis C blood test. Hepatitis B blood test. Sexually transmitted disease (STD) testing. Diabetes screening. This is done by checking your blood sugar (glucose) after you have not eaten for a while (fasting). You may have this done every 1-3 years. Abdominal aortic aneurysm (AAA) screening. You may need this if you are a current or former smoker. Osteoporosis. You may be screened starting at age 26 if you are at high risk. Talk with your health care provider about your test results, treatment options, and if necessary, the need for more tests. Vaccines  Your health care provider may recommend certain vaccines, such as: Influenza vaccine. This is recommended every year. Tetanus, diphtheria, and acellular pertussis (Tdap, Td) vaccine. You may need a Td booster every 10 years. Zoster vaccine. You may need this  after age 43. Pneumococcal 13-valent  conjugate (PCV13) vaccine. One dose is recommended after age 81. Pneumococcal polysaccharide (PPSV23) vaccine. One dose is recommended after age 48. Talk to your health care provider about which screenings and vaccines you need and how often you need them. This information is not intended to replace advice given to you by your health care provider. Make sure you discuss any questions you have with your health care provider. Document Released: 04/24/2015 Document Revised: 12/16/2015 Document Reviewed: 01/27/2015 Elsevier Interactive Patient Education  2017 [employer] Prevention in the Home Falls can cause injuries. They can happen to people of all ages. There are many things you can do to make your home safe and to help prevent falls. What can I do on the outside of my home? Regularly fix the edges of walkways and driveways and fix any cracks. Remove anything that might make you trip as you walk through a door, such as a raised step or threshold. Trim any bushes or trees on the path to your home. Use bright outdoor lighting. Clear any walking paths of anything that might make someone trip, such as rocks or tools. Regularly check to see if handrails are loose or broken. Make sure that both sides of any steps have handrails. Any raised decks and porches should have guardrails on the edges. Have any leaves, snow, or ice cleared regularly. Use sand or salt on walking paths during winter. Clean up any spills in your garage right away. This includes oil or grease spills. What can I do in the bathroom? Use night lights. Install grab bars by the toilet and in the tub and shower. Do not use towel bars as grab bars. Use non-skid mats or decals in the tub or shower. If you need to sit down in the shower, use a plastic, non-slip stool. Keep the floor dry. Clean up any water that spills on the floor as soon as it happens. Remove soap buildup in the tub or shower regularly. Attach bath mats  securely with double-sided non-slip rug tape. Do not have throw rugs and other things on the floor that can make you trip. What can I do in the bedroom? Use night lights. Make sure that you have a light by your bed that is easy to reach. Do not use any sheets or blankets that are too big for your bed. They should not hang down onto the floor. Have a firm chair that has side arms. You can use this for support while you get dressed. Do not have throw rugs and other things on the floor that can make you trip. What can I do in the kitchen? Clean up any spills right away. Avoid walking on wet floors. Keep items that you use a lot in easy-to-reach places. If you need to reach something above you, use a strong step stool that has a grab bar. Keep electrical cords out of the way. Do not use floor polish or wax that makes floors slippery. If you must use wax, use non-skid floor wax. Do not have throw rugs and other things on the floor that can make you trip. What can I do with my stairs? Do not leave any items on the stairs. Make sure that there are handrails on both sides of the stairs and use them. Fix handrails that are broken or loose. Make sure that handrails are as long as the stairways. Check any carpeting to make sure that it is firmly attached to the  stairs. Fix any carpet that is loose or worn. Avoid having throw rugs at the top or bottom of the stairs. If you do have throw rugs, attach them to the floor with carpet tape. Make sure that you have a light switch at the top of the stairs and the bottom of the stairs. If you do not have them, ask someone to add them for you. What else can I do to help prevent falls? Wear shoes that: Do not have high heels. Have rubber bottoms. Are comfortable and fit you well. Are closed at the toe. Do not wear sandals. If you use a stepladder: Make sure that it is fully opened. Do not climb a closed stepladder. Make sure that both sides of the stepladder  are locked into place. Ask someone to hold it for you, if possible. Clearly mark and make sure that you can see: Any grab bars or handrails. First and last steps. Where the edge of each step is. Use tools that help you move around (mobility aids) if they are needed. These include: Canes. Walkers. Scooters. Crutches. Turn on the lights when you go into a dark area. Replace any light bulbs as soon as they burn out. Set up your furniture so you have a clear path. Avoid moving your furniture around. If any of your floors are uneven, fix them. If there are any pets around you, be aware of where they are. Review your medicines with your doctor. Some medicines can make you feel dizzy. This can increase your chance of falling. Ask your doctor what other things that you can do to help prevent falls. This information is not intended to replace advice given to you by your health care provider. Make sure you discuss any questions you have with your health care provider. Document Released: 01/22/2009 Document Revised: 09/03/2015 Document Reviewed: 05/02/2014 Elsevier Interactive Patient Education  2017 [employer].

## 2021-03-02 DIAGNOSIS — G4733 Obstructive sleep apnea (adult) (pediatric): Secondary | ICD-10-CM | POA: Diagnosis not present

## 2021-03-02 DIAGNOSIS — F5101 Primary insomnia: Secondary | ICD-10-CM | POA: Diagnosis not present

## 2021-03-03 ENCOUNTER — Telehealth: Payer: Self-pay | Admitting: Family Medicine

## 2021-03-03 ENCOUNTER — Other Ambulatory Visit: Payer: Self-pay | Admitting: Family Medicine

## 2021-03-03 NOTE — Telephone Encounter (Signed)
Last office visit 01/21/2021 (virtual) for acute cough.  Last refilled 02/02/2021 for #30 with no refills.  CPE scheduled 03/26/2021.

## 2021-03-03 NOTE — Telephone Encounter (Signed)
Spoke with Mr. Lunde.  He states he received this prescription through mail order.  I advised I was not showing that we have sent refills in for his atorvastatin 80 mg to mail order.  The last refill I am showing was sent to [facility] on 05/08/2020 for #90 with 3 refills.  I ask if the bottle had a different doctors name and he said no it said Dr. Diona Browner.  Patient will check with [facility] to pick up his regular refill.  He did call the mail order pharmacy and states he was on the phone with them for like 2 hours and got no where.  FYI to Dr. Diona Browner.

## 2021-03-03 NOTE — Telephone Encounter (Signed)
Pt called in stated he was given the wrong amount or medication atorvastatin (LIPITOR) 80 MG tablet only received 45 pills . Would like a call back  to get this resolve . Please advise [phone]

## 2021-03-10 ENCOUNTER — Other Ambulatory Visit: Payer: Self-pay | Admitting: Family Medicine

## 2021-03-18 ENCOUNTER — Other Ambulatory Visit: Payer: Self-pay

## 2021-03-18 ENCOUNTER — Ambulatory Visit: Payer: Medicare Other | Admitting: Pharmacist

## 2021-03-18 ENCOUNTER — Telehealth: Payer: Self-pay | Admitting: *Deleted

## 2021-03-18 ENCOUNTER — Telehealth: Payer: Self-pay | Admitting: Family Medicine

## 2021-03-18 DIAGNOSIS — E785 Hyperlipidemia, unspecified: Secondary | ICD-10-CM

## 2021-03-18 DIAGNOSIS — I951 Orthostatic hypotension: Secondary | ICD-10-CM

## 2021-03-18 DIAGNOSIS — M816 Localized osteoporosis [Lequesne]: Secondary | ICD-10-CM

## 2021-03-18 DIAGNOSIS — I2510 Atherosclerotic heart disease of native coronary artery without angina pectoris: Secondary | ICD-10-CM

## 2021-03-18 DIAGNOSIS — E039 Hypothyroidism, unspecified: Secondary | ICD-10-CM

## 2021-03-18 DIAGNOSIS — F411 Generalized anxiety disorder: Secondary | ICD-10-CM

## 2021-03-18 DIAGNOSIS — F32A Depression, unspecified: Secondary | ICD-10-CM

## 2021-03-18 DIAGNOSIS — D509 Iron deficiency anemia, unspecified: Secondary | ICD-10-CM

## 2021-03-18 DIAGNOSIS — G609 Hereditary and idiopathic neuropathy, unspecified: Secondary | ICD-10-CM

## 2021-03-18 NOTE — Progress Notes (Signed)
Chronic Care Management Pharmacy Note  03/22/2021 Name:  Shawn Lam MRN:  [id number] DOB:  Mar 02, 1951  Summary: -Pt endorses compliance with medications; he just recently increased atorvastatin back to 80 mg whole tablets (about 4 weeks ago) - mail order pharmacy had been sending him #45 per 90 days for most of this year but error was recently corrected; upcoming lipid panel should reflect 80 mg dose as he has been on it for ~4 weeks  Recommendations/Changes made from today's visit: -No med changes  Plan: -[place] will call patient 3 months for general adherence -Pharmacist follow up televisit scheduled for 6 months   Subjective: Shawn Lam is an 70 y.o. year old male who is a primary patient of Bedsole, Amy E, MD.  The CCM team was consulted for assistance with disease management and care coordination needs.    Engaged with patient by telephone for initial visit in response to provider referral for pharmacy case management and/or care coordination services.   Consent to Services:  The patient was given the following information about Chronic Care Management services today, agreed to services, and gave verbal consent: 1. CCM service includes personalized support from designated clinical staff supervised by the primary care provider, including individualized plan of care and coordination with other care providers 2. 24/7 contact phone numbers for assistance for urgent and routine care needs. 3. Service will only be billed when office clinical staff spend 20 minutes or more in a month to coordinate care. 4. Only one practitioner may furnish and bill the service in a calendar month. 5.The patient may stop CCM services at any time (effective at the end of the month) by phone call to the office staff. 6. The patient will be responsible for cost sharing (co-pay) of up to 20% of the service fee (after annual deductible is met). Patient agreed to services and consent  obtained.  Patient Care Team: Jinny Sanders, MD as PCP - General Rockey Situ Kathlene November, MD as PCP - Cardiology (Cardiology) Charlton Haws, [facility] as Pharmacist (Pharmacist)  Recent office visits: 02/26/21 LPN - AWV 69/67/89 Dr Diona Browner VV: acute cough. Rx'd z-pak and Hycodan syrup. Covid negative.  04/16/20 Dr Diona Browner OV: f/u osteoporosis. Start Prolia injections. Repeat DEXA 1-2 years.  Recent consult visits: 03/02/21 NP Peri Jefferson (Pulmonology): f/u OSA. On Inspire therapy. Repeat home sleep study. F/U 6 months  02/04/21 Dr Nehemiah Massed (Dermatology): f/u neoplasm on neck. Rx'd fluorouracil 5% cream x 7 days.  09/17/20 Dr Nehemiah Massed (Dermatology): f/u Basal cell carcinoma.  04/24/20 Dr Rockey Situ (cardiology): f/u CAD; rec'd lasix sparingly; continue midodrine  Hospital visits: 10/31/20 ED visit - L hand laceration. Left before being seen.   Objective:  Lab Results  Component Value Date   CREATININE 1.40 03/19/2021   BUN 11 03/19/2021   GFR 50.84 (L) 03/19/2021   GFRNONAA 52 (L) 02/07/2020   GFRAA 60 (L) 01/08/2020   NA 143 03/19/2021   K 4.2 03/19/2021   CALCIUM 8.6 03/19/2021   CO2 26 03/19/2021   GLUCOSE 82 03/19/2021    Lab Results  Component Value Date/Time   HGBA1C 5.1 08/11/2016 08:59 AM   HGBA1C 5.5 02/02/2016 07:52 AM   GFR 50.84 (L) 03/19/2021 10:15 AM   GFR 48.05 (L) 12/01/2020 08:25 AM   MICROALBUR 0.5 06/18/2008 10:37 AM    Last diabetic Eye exam: No results found for: HMDIABEYEEXA  Last diabetic Foot exam: No results found for: HMDIABFOOTEX   Lab Results  Component Value  Date   CHOL 128 03/19/2021   HDL 73.70 03/19/2021   LDLCALC 45 03/19/2021   LDLDIRECT 57 06/22/2009   TRIG 46.0 03/19/2021   CHOLHDL 2 03/19/2021    Hepatic Function Latest Ref Rng & Units 03/19/2021 03/12/2020 01/23/2020  Total Protein 6.0 - 8.3 g/dL 6.0 6.3 7.0  Albumin 3.5 - 5.2 g/dL 3.7 3.8 3.4(L)  AST 0 - 37 U/L 32 36 35  ALT 0 - 53 U/L 21 21 25   Alk Phosphatase 39 - 117 U/L  73 75 65  Total Bilirubin 0.2 - 1.2 mg/dL 0.7 0.7 1.0  Bilirubin, Direct 0.00 - 0.40 mg/dL - - -    Lab Results  Component Value Date/Time   TSH 1.32 03/19/2021 10:15 AM   TSH 2.000 01/23/2020 12:08 PM   FREET4 0.83 03/19/2021 10:15 AM   FREET4 1.04 07/23/2019 01:27 PM    CBC Latest Ref Rng & Units 03/19/2021 02/07/2020 01/23/2020  WBC 4.0 - 10.5 K/uL 5.9 6.8 13.6(H)  Hemoglobin 13.0 - 17.0 g/dL 13.1 13.3 14.4  Hematocrit 39.0 - 52.0 % 40.2 40.3 43.9  Platelets 150.0 - 400.0 K/uL 151.0 307 283   Iron/TIBC/Ferritin/ %Sat    Component Value Date/Time   IRON 67 11/09/2018 0845   FERRITIN 621 (H) 01/08/2020 0513   IRONPCTSAT 20.7 11/09/2018 0845    Lab Results  Component Value Date/Time   VD25OH 38.45 02/13/2018 02:12 PM    Clinical ASCVD: Yes  The ASCVD Risk score (Arnett DK, et al., 2019) failed to calculate for the following reasons:   The valid total cholesterol range is 130 to 320 mg/dL    Depression screen [facility] 2/9 02/26/2021 02/25/2020 08/22/2019  Decreased Interest 0 0 0  Down, Depressed, Hopeless 0 0 0  PHQ - 2 Score 0 0 0  Altered sleeping - 0 -  Tired, decreased energy - 0 -  Change in appetite - 0 -  Feeling bad or failure about yourself  - 0 -  Trouble concentrating - 0 -  Moving slowly or fidgety/restless - 0 -  Suicidal thoughts - 0 -  PHQ-9 Score - 0 -  Difficult doing work/chores - Not difficult at all -  Some recent data might be hidden    GAD 7 : Generalized Anxiety Score 08/22/2019  Nervous, Anxious, on Edge 2  Control/stop worrying 2  Worry too much - different things 1  Trouble relaxing 3  Restless 3  Easily annoyed or irritable 2  Afraid - awful might happen 0  Total GAD 7 Score 13  Anxiety Difficulty Somewhat difficult    Social History   Tobacco Use  Smoking Status Never  Smokeless Tobacco Never   BP Readings from Last 3 Encounters:  01/21/21 106/66  10/31/20 116/71  04/24/20 112/64   Pulse Readings from Last 3 Encounters:   01/21/21 72  10/31/20 75  04/24/20 69   Wt Readings from Last 3 Encounters:  02/26/21 174 lb (78.9 kg)  01/21/21 175 lb (79.4 kg)  10/31/20 170 lb (77.1 kg)   BMI Readings from Last 3 Encounters:  02/26/21 23.60 kg/m  01/21/21 25.66 kg/m  10/31/20 23.06 kg/m    Assessment/Interventions: Review of patient past medical history, allergies, medications, health status, including review of consultants reports, laboratory and other test data, was performed as part of comprehensive evaluation and provision of chronic care management services.   SDOH:  (Social Determinants of Health) assessments and interventions performed: Yes  SDOH Screenings   Alcohol Screen: Low Risk  Last Alcohol Screening Score (AUDIT): 0  Depression (PHQ2-9): Low Risk    PHQ-2 Score: 0  Financial Resource Strain: Low Risk    Difficulty of Paying Living Expenses: Not hard at all  Food Insecurity: No Food Insecurity   Worried About Charity fundraiser in the Last Year: Never true   Ran Out of Food in the Last Year: Never true  Housing: Low Risk    Last Housing Risk Score: 0  Physical Activity: Sufficiently Active   Days of Exercise per Week: 7 days   Minutes of Exercise per Session: 50 min  Social Connections: Moderately Isolated   Frequency of Communication with Friends and Family: Twice a week   Frequency of Social Gatherings with Friends and Family: More than three times a week   Attends Religious Services: Never   Marine scientist or Organizations: No   Attends Music therapist: Never   Marital Status: Married  Stress: No Stress Concern Present   Feeling of Stress : Not at all  Tobacco Use: Low Risk    Smoking Tobacco Use: Never   Smokeless Tobacco Use: Never   Passive Exposure: Not on file  Transportation Needs: No Transportation Needs   Lack of Transportation (Medical): No   Lack of Transportation (Non-Medical): No    CCM Care Plan  Allergies  Allergen Reactions    Codeine Nausea Only   Tape Other (See Comments)    Use only paper tape. Severe blistering and bruising with other tapes. No cloth tape.    Medications Reviewed Today     Reviewed by Charlton Haws, [facility] (Pharmacist) on 03/18/21 at 1445  Med List Status: <None>   Medication Order Taking? Sig Documenting Provider Last Dose Status Informant  atorvastatin (LIPITOR) 80 MG tablet [id number] Yes Take 1 tablet (80 mg total) by mouth daily. Jinny Sanders, MD Taking Active   busPIRone (BUSPAR) 10 MG tablet [id number] Yes TAKE 2 TABLETS BY MOUTH 3 TIMES DAILY. Jinny Sanders, MD Taking Active   clopidogrel (PLAVIX) 75 MG tablet [id number] Yes TAKE 1 TABLET DAILY Gollan, Kathlene November, MD Taking Active   ferrous sulfate 325 (65 FE) MG tablet [id number] Yes TAKE 1 TABLET BY MOUTH 3 TIMES DAILY WITH MEALS. Jinny Sanders, MD Taking Active   finasteride (PROSCAR) 5 MG tablet [id number] Yes Take 5 mg by mouth daily. [provider] Taking Active   FLUoxetine (PROZAC) 20 MG capsule [id number] Yes TAKE 1 CAPSULE DAILY. TAKE WITH 40MG TO EQUAL 60MG    ONCE DAILY Bedsole, Amy E, MD Taking Active   FLUoxetine (PROZAC) 40 MG capsule [id number] Yes TAKE 1 CAPSULE EVERY       MORNING Bedsole, Amy E, MD Taking Active   furosemide (LASIX) 20 MG tablet [id number] Yes TAKE 1 TABLET BY MOUTH EVERY DAY Bedsole, Amy E, MD Taking Active   gabapentin (NEURONTIN) 100 MG capsule [id number] Yes TAKE TWO CAPSULES BY MOUTH TWICE A DAY Bedsole, Amy E, MD Taking Active   levothyroxine (SYNTHROID) 75 MCG tablet [id number] Yes TAKE 1 TABLET BY MOUTH EVERY DAY BEFORE BREAKFAST Bedsole, Amy E, MD Taking Active   midodrine (PROAMATINE) 10 MG tablet [id number] Yes Take 1 tablet (10 mg total) by mouth 3 (three) times daily. Minna Merritts, MD Taking Active   mirtazapine (REMERON) 15 MG tablet [id number] Yes TAKE 1 TABLET BY MOUTH EVERYDAY AT BEDTIME Bedsole, Amy E, MD Taking Active   nitroGLYCERIN (NITROSTAT) 0.4 MG SL tablet [id number]  Yes Place  1 tablet (0.4 mg total) under the tongue every 5 (five) minutes as needed for chest pain. Elby Beck, FNP Taking Active Spouse/Significant Other  pantoprazole (PROTONIX) 40 MG tablet [id number] Yes TAKE 1 TABLET BY MOUTH TWICE A DAY Bedsole, Amy E, MD Taking Active   tamsulosin (FLOMAX) 0.4 MG CAPS capsule [id number] Yes TAKE 2 CAPSULES BY MOUTH EVERY DAY Bedsole, Amy E, MD Taking Active   triamcinolone cream (KENALOG) 0.1 % [id number] Yes APPLY 1 APPLICATION TOPICALLY 2 (TWO) TIMES DAILY. AVOID FACE, GROIN, UNDERARMS. Jinny Sanders, MD Taking Active   zolpidem (AMBIEN) 10 MG tablet [id number] Yes TAKE 1 TABLET BY MOUTH EVERY DAY AT BEDTIME AS NEEDED Jinny Sanders, MD Taking Active             Patient Active Problem List   Diagnosis Date Noted   Acute cough 02/24/2021   Chronic insomnia 03/19/2020   Peripheral neuropathy 03/19/2020   Urinary dribbling 03/16/2020   Pneumonia due to COVID-19 virus 01/06/2020   Acute respiratory failure with hypoxia ([place]) 01/06/2020   Iron deficiency anemia 01/06/2020   Rash 08/22/2019   Allergic dermatitis 07/08/2019   Night sweats 07/08/2019   Osteoarthritis of left knee 02/26/2019   Personal history of colonic polyps    Postbulbar duodenal ulcer    Chest pain 11/29/2018   CKD (chronic kidney disease), stage IIIa    Dark stools 11/09/2018   Bilateral chronic knee pain 11/09/2018   Chronic pain of right thumb 02/22/2018   Acute bilateral thoracic back pain 01/23/2018   Osteoporosis without current pathological fracture 02/10/2017   Hypothyroidism 11/02/2016   Thoracic compression fracture ([place]) 09/06/2016   PVC's (premature ventricular contractions) 07/07/2016   Hypoalbuminemia 06/28/2016   IgA nephropathy 06/07/2016   Palpitation 03/15/2016   Mild depression 11/20/2015   OSA (obstructive sleep apnea) 02/05/2014   Orthostatic hypotension 02/07/2013   Anemia 01/31/2013   Scotoma involving central area 01/29/2013   Optic  neuropathy 01/28/2013   Fatigue 01/20/2011   HEMATURIA UNSPECIFIED 01/21/2010   HLD (hyperlipidemia) 07/05/2006   Generalized anxiety disorder 07/05/2006   CAD (coronary artery disease) 07/05/2006   GERD 07/05/2006    Immunization History  Administered Date(s) Administered   Fluad Quad(high Dose 65+) 12/04/2018, 01/01/2020   Influenza Split 01/04/2011, 01/27/2012   Influenza Whole 01/09/2008, 03/04/2009, 12/27/2009   Influenza, High Dose Seasonal PF 01/04/2021   Influenza,inj,Quad PF,6+ Mos 01/31/2013, 01/31/2014, 02/03/2015, 02/09/2016, 01/20/2017, 01/23/2018   PFIZER Comirnaty(Gray Top)Covid-19 Tri-Sucrose Vaccine 05/04/2020   PFIZER(Purple Top)SARS-COV-2 Vaccination 06/01/2019, 06/25/2019   Pneumococcal Conjugate-13 02/09/2016   Pneumococcal Polysaccharide-23 02/23/2017   Td 06/05/2007   Tdap 03/13/2019   Zoster, Live 01/25/2011    Conditions to be addressed/monitored:  Hypertension, Hyperlipidemia, Coronary Artery Disease, Chronic Kidney Disease, Hypothyroidism, Depression, and Osteoporosis  Care Plan : [place]  Updates made by Charlton Haws, [place] since 03/22/2021 12:00 AM     Problem: Hypertension, Hyperlipidemia, Coronary Artery Disease, Chronic Kidney Disease, Hypothyroidism, Depression, and Osteoporosis      Long-Range Goal: Disease mgmt   Start Date: 03/22/2021  Expected End Date: 03/22/2022  This Visit's Progress: On track  Priority: High  Note:   Current Barriers:  Unable to independently monitor therapeutic efficacy  Pharmacist Clinical Goal(s):  Patient will achieve adherence to monitoring guidelines and medication adherence to achieve therapeutic efficacy through collaboration with PharmD and provider.   Interventions: 1:1 collaboration with Jinny Sanders, MD regarding development and update of comprehensive plan of care as evidenced by  provider attestation and co-signature Inter-disciplinary care team collaboration (see  longitudinal plan of care) Comprehensive medication review performed; medication list updated in electronic medical record  Orthostatic hypotension (BP goal 100/60-130/80) -Controlled - pt reports less frequent dizzy spells since starting midodrine; he reports BP still drops occasionally but overall much improved -OSA with Inspire device -Current treatment: Midodrine 10 mg TID -Educated on Importance of home blood pressure monitoring; -Counseled to monitor BP at home periodically -Recommended to continue current medication  Hyperlipidemia: (LDL goal < 70) -Controlled - pt was taking atorvastatin 40 mg (1/2 tab of 80 mg) for a while because mail order was sending #45 tablets/90 days by mistake; he identified the error and increased atorvastatin back to 80 mg about 3-4 weeks ago; LDL was previously at goal on 80 mg; he denies major bleeding problems; -Hx CAD w/ stents -Current treatment: Atorvastatin 80 mg daily Clopidogrel 75 mg daily  Nitroglycerin 0.4 mg SL prn -Educated on Cholesterol goals; Benefits of statin for ASCVD risk reduction; -Discussed upcoming lipid panel will be mostly reflective of atorvastatin 80 mg dose, it is reasonable to check lipids within 4 weeks of dose change -Recommended to continue current medication  Depression/Anxiety/Insomnia (Goal: manage symptoms) -Controlled - per patient report -Current treatment: Fluoxetine 20 mg daily Fluoxetine 40 mg daily Mirtazapine 15 mg HS Buspirone 10 mg - 2 tab TID Zolpidem 10 mg daily PRN -PHQ9: 0 -GAD7: 13 (08/2019) -Educated on Benefits of medication for symptom control -Recommended to continue current medication  Osteoporosis (Goal prevent fractures) -Controlled -Last DEXA Scan: 04/02/20   T-Score femoral neck: -2.8  T-Score total hip: -2.2  T-Score lumbar spine: -0.1  T-Score forearm radius: -0.2 -Patient is a candidate for pharmacologic treatment due to T-Score < -2.5 in femoral neck -Current treatment   Prolia (given 06/08/20, 12/08/20) -Medications previously tried: Forteo, alendronate  -Recommend weight-bearing and muscle strengthening exercises for building and maintaining bone density.  Peripheral neuropathy (Goal: manage symptoms) -Controlled - per pt report -Current treatment  Gabapentin 100 mg - 2 cap BID -Recommended to continue current medication  Hypothyroidism (Goal: maintain TSH in goal range) -Controlled- TSH is at goal -Current treatment  Levothyroxine 75 mcg daily -Recommended to continue current medication  GERD (Goal: manage symptoms) -Controlled - pt has hx of duodenal ulcer -Current treatment  Pantoprazole 40 mg daily  -Recommended to continue current medication  BPH (Goal: manage symptoms) -Controlled - per pt report -Current treatment  Tamsulosin 0.4 mg - 2 cap daily Finasteride 5 mg daily (Dr Tresa Moore) -Counseled on role of each medication -Recommended to continue current medication  Health Maintenance -Vaccine gaps: Covid booster, Shingrix -CKD Stage 3a -Current therapy:  Ferrous sulfate 325 mg TID Vitamin B12 1000 mcg daily Triamcinolone 0.1% cream -Patient is satisfied with current therapy and denies issues -Recommended to continue current medication  Patient Goals/Self-Care Activities Patient will:  - take medications as prescribed as evidenced by patient report and record review focus on medication adherence by routine check blood pressure periodically, document, and provide at future appointments      Medication Assistance: None required.  Patient affirms current coverage meets needs.  Compliance/Adherence/Medication fill history: Care Gaps: None  Star-Rating Drugs: Atorvastatin - LF 02/17/21 x 90 ds; [place] 100%  Patient[facility] pharmacy [facility]:  [id number] [place] [id number]: [phone]: [phone] [facility] [place] [facility]  [address] [place] [address] Phone: [phone] Fax: [place] [id number], [place] - [place] [address] Phone: [phone] Fax: [phone]  [place], [place] [address] [place] [address] Phone: [phone] Fax: [phone]  Uses pill box? Yes Pt endorses 100% compliance  We discussed: Current pharmacy is preferred with insurance plan and patient is satisfied with pharmacy services Patient decided to: Continue current medication management strategy  Care Plan and Follow Up Patient Decision:  Patient agrees to Care Plan and Follow-up.  Plan: Telephone follow up appointment with care management team member scheduled for:  6 months  Charlene Brooke, PharmD, BCACP Clinical Pharmacist [facility] [phone]

## 2021-03-18 NOTE — Telephone Encounter (Signed)
Please place future orders for lab appt.  

## 2021-03-18 NOTE — Telephone Encounter (Signed)
-----   Message from Ellamae Sia sent at 03/01/2021 11:13 AM EST ----- Regarding: Lab orders for  Friday, 12.9.22 Patient is scheduled for CPX labs, please order future labs, Thanks , Karna Christmas

## 2021-03-19 ENCOUNTER — Other Ambulatory Visit: Payer: Self-pay

## 2021-03-19 ENCOUNTER — Other Ambulatory Visit (INDEPENDENT_AMBULATORY_CARE_PROVIDER_SITE_OTHER): Payer: Medicare Other

## 2021-03-19 ENCOUNTER — Other Ambulatory Visit: Payer: Medicare Other

## 2021-03-19 ENCOUNTER — Other Ambulatory Visit: Payer: Self-pay | Admitting: Family Medicine

## 2021-03-19 DIAGNOSIS — D509 Iron deficiency anemia, unspecified: Secondary | ICD-10-CM

## 2021-03-19 DIAGNOSIS — E039 Hypothyroidism, unspecified: Secondary | ICD-10-CM

## 2021-03-19 DIAGNOSIS — E785 Hyperlipidemia, unspecified: Secondary | ICD-10-CM

## 2021-03-19 DIAGNOSIS — N200 Calculus of kidney: Secondary | ICD-10-CM | POA: Diagnosis not present

## 2021-03-19 DIAGNOSIS — R300 Dysuria: Secondary | ICD-10-CM | POA: Diagnosis not present

## 2021-03-19 LAB — COMPREHENSIVE METABOLIC PANEL
ALT: 21 U/L (ref 0–53)
AST: 32 U/L (ref 0–37)
Albumin: 3.7 g/dL (ref 3.5–5.2)
Alkaline Phosphatase: 73 U/L (ref 39–117)
BUN: 11 mg/dL (ref 6–23)
CO2: 26 mEq/L (ref 19–32)
Calcium: 8.6 mg/dL (ref 8.4–10.5)
Chloride: 109 mEq/L (ref 96–112)
Creatinine, Ser: 1.4 mg/dL (ref 0.40–1.50)
GFR: 50.84 mL/min — ABNORMAL LOW (ref >60.00)
Glucose, Bld: 82 mg/dL (ref 70–99)
Potassium: 4.2 mEq/L (ref 3.5–5.1)
Sodium: 143 mEq/L (ref 135–145)
Total Bilirubin: 0.7 mg/dL (ref 0.2–1.2)
Total Protein: 6 g/dL (ref 6.0–8.3)

## 2021-03-19 LAB — CBC WITH DIFFERENTIAL/PLATELET
Basophils Absolute: 0 10*3/uL (ref 0.0–0.1)
Basophils Relative: 0.7 % (ref 0.0–3.0)
Eosinophils Absolute: 0.1 10*3/uL (ref 0.0–0.7)
Eosinophils Relative: 1.9 % (ref 0.0–5.0)
HCT: 40.2 % (ref 39.0–52.0)
Hemoglobin: 13.1 g/dL (ref 13.0–17.0)
Lymphocytes Relative: 20 % (ref 12.0–46.0)
Lymphs Abs: 1.2 10*3/uL (ref 0.7–4.0)
MCHC: 32.7 g/dL (ref 30.0–36.0)
MCV: 98.3 fl (ref 78.0–100.0)
Monocytes Absolute: 0.5 10*3/uL (ref 0.1–1.0)
Monocytes Relative: 9.3 % (ref 3.0–12.0)
Neutro Abs: 4 10*3/uL (ref 1.4–7.7)
Neutrophils Relative %: 68.1 % (ref 43.0–77.0)
Platelets: 151 10*3/uL (ref 150.0–400.0)
RBC: 4.09 Mil/uL — ABNORMAL LOW (ref 4.22–5.81)
RDW: 14.8 % (ref 11.5–15.5)
WBC: 5.9 10*3/uL (ref 4.0–10.5)

## 2021-03-19 LAB — LIPID PANEL
Cholesterol: 128 mg/dL (ref 0–200)
HDL: 73.7 mg/dL (ref >39.00)
LDL Cholesterol: 45 mg/dL (ref 0–99)
NonHDL: 54.12
Total CHOL/HDL Ratio: 2
Triglycerides: 46 mg/dL (ref 0.0–149.0)
VLDL: 9.2 mg/dL (ref 0.0–40.0)

## 2021-03-19 LAB — T3, FREE: T3, Free: 2.5 pg/mL (ref 2.3–4.2)

## 2021-03-19 LAB — T4, FREE: Free T4: 0.83 ng/dL (ref 0.60–1.60)

## 2021-03-19 LAB — TSH: TSH: 1.32 u[IU]/mL (ref 0.35–5.50)

## 2021-03-19 NOTE — Progress Notes (Signed)
No critical labs need to be addressed urgently. We will discuss labs in detail at upcoming office visit.   

## 2021-03-20 ENCOUNTER — Other Ambulatory Visit: Payer: Self-pay | Admitting: Family Medicine

## 2021-03-21 NOTE — Telephone Encounter (Signed)
Last office visit 01/21/21 (virtual) acute cough.  Last refilled 01/19/21 for #120 with 1 refills.  CPE scheduled for  03/26/21.

## 2021-03-22 NOTE — Patient Instructions (Signed)
Visit Information  Phone number for Pharmacist: [phone]  Thank you for meeting with me to discuss your medications! I look forward to working with you to achieve your health care goals. Below is a summary of what we talked about during the visit:   Goals Addressed             This Visit's Progress    Manage My Medicine       Timeframe:  Long-Range Goal Priority:  Medium Start Date:     03/18/21                        Expected End Date: 03/18/22                      Follow Up Date June 2023   - call for medicine refill 2 or 3 days before it runs out - call if I am sick and can't take my medicine - keep a list of all the medicines I take; vitamins and herbals too - use a pillbox to sort medicine    Why is this important?   These steps will help you keep on track with your medicines.   Notes:         Care Plan : [place]  Updates made by Charlton Haws, RPH since 03/22/2021 12:00 AM     Problem: Hypertension, Hyperlipidemia, Coronary Artery Disease, Chronic Kidney Disease, Hypothyroidism, Depression, and Osteoporosis      Long-Range Goal: Disease mgmt   Start Date: 03/22/2021  Expected End Date: 03/22/2022  This Visit's Progress: On track  Priority: High  Note:   Current Barriers:  Unable to independently monitor therapeutic efficacy  Pharmacist Clinical Goal(s):  Patient will achieve adherence to monitoring guidelines and medication adherence to achieve therapeutic efficacy through collaboration with PharmD and provider.   Interventions: 1:1 collaboration with Jinny Sanders, MD regarding development and update of comprehensive plan of care as evidenced by provider attestation and co-signature Inter-disciplinary care team collaboration (see longitudinal plan of care) Comprehensive medication review performed; medication list updated in electronic medical record  Orthostatic hypotension (BP goal 100/60-130/80) -Controlled - pt reports  less frequent dizzy spells since starting midodrine; he reports BP still drops occasionally but overall much improved -OSA with Inspire device -Current treatment: Midodrine 10 mg TID -Educated on Importance of home blood pressure monitoring; -Counseled to monitor BP at home periodically -Recommended to continue current medication  Hyperlipidemia: (LDL goal < 70) -Controlled - pt was taking atorvastatin 40 mg (1/2 tab of 80 mg) for a while because mail order was sending #45 tablets/90 days by mistake; he identified the error and increased atorvastatin back to 80 mg about 3-4 weeks ago; LDL was previously at goal on 80 mg; he denies major bleeding problems; -Hx CAD w/ stents -Current treatment: Atorvastatin 80 mg daily Clopidogrel 75 mg daily  Nitroglycerin 0.4 mg SL prn -Educated on Cholesterol goals; Benefits of statin for ASCVD risk reduction; -Discussed upcoming lipid panel will be mostly reflective of atorvastatin 80 mg dose, it is reasonable to check lipids within 4 weeks of dose change -Recommended to continue current medication  Depression/Anxiety/Insomnia (Goal: manage symptoms) -Controlled - per patient report -Current treatment: Fluoxetine 20 mg daily Fluoxetine 40 mg daily Mirtazapine 15 mg HS Buspirone 10 mg - 2 tab TID Zolpidem 10 mg daily PRN -PHQ9: 0 -GAD7: 13 (08/2019) -Educated on Benefits of medication for symptom control -Recommended to continue current  medication  Osteoporosis (Goal prevent fractures) -Controlled -Last DEXA Scan: 04/02/20   T-Score femoral neck: -2.8  T-Score total hip: -2.2  T-Score lumbar spine: -0.1  T-Score forearm radius: -0.2 -Patient is a candidate for pharmacologic treatment due to T-Score < -2.5 in femoral neck -Current treatment  Prolia (given 06/08/20, 12/08/20) -Medications previously tried: Forteo, alendronate  -Recommend weight-bearing and muscle strengthening exercises for building and maintaining bone density.  Peripheral  neuropathy (Goal: manage symptoms) -Controlled - per pt report -Current treatment  Gabapentin 100 mg - 2 cap BID -Recommended to continue current medication  Hypothyroidism (Goal: maintain TSH in goal range) -Controlled- TSH is at goal -Current treatment  Levothyroxine 75 mcg daily -Recommended to continue current medication  GERD (Goal: manage symptoms) -Controlled - pt has hx of duodenal ulcer -Current treatment  Pantoprazole 40 mg daily  -Recommended to continue current medication  BPH (Goal: manage symptoms) -Controlled - per pt report -Current treatment  Tamsulosin 0.4 mg - 2 cap daily Finasteride 5 mg daily (Dr Tresa Moore) -Counseled on role of each medication -Recommended to continue current medication  Health Maintenance -Vaccine gaps: Covid booster, Shingrix -CKD Stage 3a -Current therapy:  Ferrous sulfate 325 mg TID Vitamin B12 1000 mcg daily Triamcinolone 0.1% cream -Patient is satisfied with current therapy and denies issues -Recommended to continue current medication  Patient Goals/Self-Care Activities Patient will:  - take medications as prescribed as evidenced by patient report and record review focus on medication adherence by routine check blood pressure periodically, document, and provide at future appointments      Mr. Moga was given information about Chronic Care Management services today including:  CCM service includes personalized support from designated clinical staff supervised by his physician, including individualized plan of care and coordination with other care providers 24/7 contact phone numbers for assistance for urgent and routine care needs. Standard insurance, coinsurance, copays and deductibles apply for chronic care management only during months in which we provide at least 20 minutes of these services. Most insurances cover these services at 100%, however patients may be responsible for any copay, coinsurance and/or deductible if  applicable. This service may help you avoid the need for more expensive face-to-face services. Only one practitioner may furnish and bill the service in a calendar month. The patient may stop CCM services at any time (effective at the end of the month) by phone call to the office staff.  Patient agreed to services and verbal consent obtained.   Patient verbalizes understanding of instructions provided today and agrees to view in [place].  Telephone follow up appointment with pharmacy team member scheduled for: 6 months  Charlene Brooke, PharmD, [facility] Clinical Pharmacist [facility] [phone]

## 2021-03-26 ENCOUNTER — Encounter: Payer: Self-pay | Admitting: Family Medicine

## 2021-03-26 ENCOUNTER — Encounter: Payer: Medicare Other | Admitting: Family Medicine

## 2021-03-26 ENCOUNTER — Ambulatory Visit (INDEPENDENT_AMBULATORY_CARE_PROVIDER_SITE_OTHER): Payer: Medicare Other | Admitting: Family Medicine

## 2021-03-26 ENCOUNTER — Other Ambulatory Visit: Payer: Self-pay

## 2021-03-26 DIAGNOSIS — G4733 Obstructive sleep apnea (adult) (pediatric): Secondary | ICD-10-CM

## 2021-03-26 DIAGNOSIS — I2510 Atherosclerotic heart disease of native coronary artery without angina pectoris: Secondary | ICD-10-CM | POA: Diagnosis not present

## 2021-03-26 DIAGNOSIS — D509 Iron deficiency anemia, unspecified: Secondary | ICD-10-CM | POA: Diagnosis not present

## 2021-03-26 DIAGNOSIS — M816 Localized osteoporosis [Lequesne]: Secondary | ICD-10-CM | POA: Diagnosis not present

## 2021-03-26 DIAGNOSIS — I25118 Atherosclerotic heart disease of native coronary artery with other forms of angina pectoris: Secondary | ICD-10-CM

## 2021-03-26 DIAGNOSIS — N028 Recurrent and persistent hematuria with other morphologic changes: Secondary | ICD-10-CM

## 2021-03-26 DIAGNOSIS — E039 Hypothyroidism, unspecified: Secondary | ICD-10-CM | POA: Diagnosis not present

## 2021-03-26 DIAGNOSIS — G609 Hereditary and idiopathic neuropathy, unspecified: Secondary | ICD-10-CM

## 2021-03-26 DIAGNOSIS — E785 Hyperlipidemia, unspecified: Secondary | ICD-10-CM

## 2021-03-26 DIAGNOSIS — F411 Generalized anxiety disorder: Secondary | ICD-10-CM | POA: Diagnosis not present

## 2021-03-26 MED ORDER — NITROGLYCERIN 0.4 MG SL SUBL: 0.4000 mg | SUBLINGUAL_TABLET | SUBLINGUAL | 1 refills | Status: DC | PRN

## 2021-03-26 NOTE — Progress Notes (Signed)
Patient ID: Shawn Lam, male    DOB: Aug 14, 1950, 70 y.o.   MRN: [id number]  This visit was conducted in person.  BP (!) 108/56    Pulse 82    Temp 98 F (36.7 C)    Resp 16    Ht 5\' 9"  (1.753 m)    Wt 165 lb (74.8 kg)    SpO2 96%    BMI 24.37 kg/m    CC:  Chief Complaint  Patient presents with   Annual Exam    Cpe part 2, AWV done on 02/26/21    Subjective:   HPI: Shawn Lam is a 70 y.o. male presenting on 03/26/2021 for Annual Exam (Cpe part 2, AWV done on 02/26/21)   The patient presents for  review of chronic health problems. He/She also has the following acute concerns today: none  The patient saw a LPN or RN for medicare wellness visit.  Prevention and wellness was reviewed in detail. Note reviewed and important notes copied below.  OSA: Followed by pulmonary  02/2021 OV reviewed.. recommend continuation of Inspire therapy.   Wt Readings from Last 3 Encounters:  03/26/21 165 lb (74.8 kg)  02/26/21 174 lb (78.9 kg)  01/21/21 175 lb (79.4 kg)    Chronic insomnia: Stable control on Ambien..10 mg nightly.     IG A nephropathy/CKD: Followed by Dr.  Lorrene Reid.   Orthostatic hypotension... stable on midodrine 10 mg TID CAD: followed by Dr. Rockey Situ cardiology.. has appt next month.   Hypothyroid  Stable control on levothyroxine 75 mg daily Lab Results  Component Value Date   TSH 1.32 03/19/2021   Elevated Cholesterol: Good control at goal LDL < 70 on atorvastatin 40 mg daily Lab Results  Component Value Date   CHOL 128 03/19/2021   HDL 73.70 03/19/2021   LDLCALC 45 03/19/2021   LDLDIRECT 57 06/22/2009   TRIG 46.0 03/19/2021   CHOLHDL 2 03/19/2021  Using medications without problems: Muscle aches:  Diet compliance: healthy eating Exercise: daily  jogging Other complaints:  MDD, GAD chronic insomnia followed by psychiatry  Fluoxetine 20 mg daily Fluoxetine 40 mg daily Mirtazapine 15 mg HS Buspirone 10 mg - 2 tab TID Zolpidem 10 mg daily PRN    Peripheral neuropathy  good control on gabapentin  100 mg 2 capsules BID    BPH followed by Dr. Tresa Moore. Stable on tamsulosin and finasteride  Patient Care Team: Jinny Sanders, MD as PCP - General Rockey Situ Kathlene November, MD as PCP - Cardiology (Cardiology) Charlton Haws, [facility] as Pharmacist (Pharmacist)   Relevant past medical, surgical, family and social history reviewed and updated as indicated. Interim medical history since our last visit reviewed. Allergies and medications reviewed and updated. Outpatient Medications Prior to Visit  Medication Sig Dispense Refill   atorvastatin (LIPITOR) 80 MG tablet Take 1 tablet (80 mg total) by mouth daily. 90 tablet 3   busPIRone (BUSPAR) 10 MG tablet TAKE 2 TABLETS BY MOUTH 3 TIMES DAILY. 540 tablet 1   clopidogrel (PLAVIX) 75 MG tablet TAKE 1 TABLET DAILY 90 tablet 1   ferrous sulfate 325 (65 FE) MG tablet TAKE 1 TABLET BY MOUTH 3 TIMES DAILY WITH MEALS. 270 tablet 3   finasteride (PROSCAR) 5 MG tablet Take 5 mg by mouth daily.     FLUoxetine (PROZAC) 20 MG capsule TAKE 1 CAPSULE DAILY. TAKE WITH 40MG  TO EQUAL 60MG     ONCE DAILY 90 capsule 1   FLUoxetine (PROZAC) 40 MG capsule  TAKE 1 CAPSULE EVERY       MORNING 90 capsule 1   furosemide (LASIX) 20 MG tablet TAKE 1 TABLET BY MOUTH EVERY DAY 90 tablet 0   gabapentin (NEURONTIN) 100 MG capsule TAKE TWO CAPSULES BY MOUTH TWICE A DAY 120 capsule 1   levothyroxine (SYNTHROID) 75 MCG tablet TAKE 1 TABLET BY MOUTH EVERY DAY BEFORE BREAKFAST 90 tablet 0   midodrine (PROAMATINE) 10 MG tablet Take 1 tablet (10 mg total) by mouth 3 (three) times daily. 270 tablet 3   mirtazapine (REMERON) 15 MG tablet TAKE 1 TABLET BY MOUTH EVERYDAY AT BEDTIME 90 tablet 1   nitroGLYCERIN (NITROSTAT) 0.4 MG SL tablet Place 1 tablet (0.4 mg total) under the tongue every 5 (five) minutes as needed for chest pain. 10 tablet 1   pantoprazole (PROTONIX) 40 MG tablet TAKE 1 TABLET BY MOUTH TWICE A DAY 180 tablet 0   tamsulosin  (FLOMAX) 0.4 MG CAPS capsule TAKE 2 CAPSULES BY MOUTH EVERY DAY 180 capsule 0   triamcinolone cream (KENALOG) 0.1 % APPLY 1 APPLICATION TOPICALLY 2 (TWO) TIMES DAILY. AVOID FACE, GROIN, UNDERARMS. 454 g 0   zolpidem (AMBIEN) 10 MG tablet TAKE 1 TABLET BY MOUTH EVERY DAY AT BEDTIME AS NEEDED 30 tablet 0   No facility-administered medications prior to visit.     Per HPI unless specifically indicated in ROS section below Review of Systems Objective:  BP (!) 108/56    Pulse 82    Temp 98 F (36.7 C)    Resp 16    Ht 5\' 9"  (1.753 m)    Wt 165 lb (74.8 kg)    SpO2 96%    BMI 24.37 kg/m   Wt Readings from Last 3 Encounters:  03/26/21 165 lb (74.8 kg)  02/26/21 174 lb (78.9 kg)  01/21/21 175 lb (79.4 kg)      Physical Exam    Results for orders placed or performed in visit on 03/19/21  CBC with Differential/Platelet  Result Value Ref Range   WBC 5.9 4.0 - 10.5 K/uL   RBC 4.09 (L) 4.22 - 5.81 Mil/uL   Hemoglobin 13.1 13.0 - 17.0 g/dL   HCT 40.2 39.0 - 52.0 %   MCV 98.3 78.0 - 100.0 fl   MCHC 32.7 30.0 - 36.0 g/dL   RDW 14.8 11.5 - 15.5 %   Platelets 151.0 150.0 - 400.0 K/uL   Neutrophils Relative % 68.1 43.0 - 77.0 %   Lymphocytes Relative 20.0 12.0 - 46.0 %   Monocytes Relative 9.3 3.0 - 12.0 %   Eosinophils Relative 1.9 0.0 - 5.0 %   Basophils Relative 0.7 0.0 - 3.0 %   Neutro Abs 4.0 1.4 - 7.7 K/uL   Lymphs Abs 1.2 0.7 - 4.0 K/uL   Monocytes Absolute 0.5 0.1 - 1.0 K/uL   Eosinophils Absolute 0.1 0.0 - 0.7 K/uL   Basophils Absolute 0.0 0.0 - 0.1 K/uL  T3, free  Result Value Ref Range   T3, Free 2.5 2.3 - 4.2 pg/mL  T4, free  Result Value Ref Range   Free T4 0.83 0.60 - 1.60 ng/dL  TSH  Result Value Ref Range   TSH 1.32 0.35 - 5.50 uIU/mL  Comprehensive metabolic panel  Result Value Ref Range   Sodium 143 135 - 145 mEq/L   Potassium 4.2 3.5 - 5.1 mEq/L   Chloride 109 96 - 112 mEq/L   CO2 26 19 - 32 mEq/L   Glucose, Bld 82 70 - 99  mg/dL   BUN 11 6 - 23 mg/dL    Creatinine, Ser 1.40 0.40 - 1.50 mg/dL   Total Bilirubin 0.7 0.2 - 1.2 mg/dL   Alkaline Phosphatase 73 39 - 117 U/L   AST 32 0 - 37 U/L   ALT 21 0 - 53 U/L   Total Protein 6.0 6.0 - 8.3 g/dL   Albumin 3.7 3.5 - 5.2 g/dL   GFR 50.84 (L) >60.00 mL/min   Calcium 8.6 8.4 - 10.5 mg/dL  Lipid panel  Result Value Ref Range   Cholesterol 128 0 - 200 mg/dL   Triglycerides 46.0 0.0 - 149.0 mg/dL   HDL 73.70 >39.00 mg/dL   VLDL 9.2 0.0 - 40.0 mg/dL   LDL Cholesterol 45 0 - 99 mg/dL   Total CHOL/HDL Ratio 2    NonHDL 54.12     This visit occurred during the SARS-CoV-2 public health emergency.  Safety protocols were in place, including screening questions prior to the visit, additional usage of staff PPE, and extensive cleaning of exam room while observing appropriate contact time as indicated for disinfecting solutions.   COVID 19 screen:  No recent travel or known exposure to COVID19 The patient denies respiratory symptoms of COVID 19 at this time. The importance of social distancing was discussed today.   Assessment and Plan The patient's preventative maintenance and recommended screening tests for an annual wellness exam were reviewed in full today. Brought up to date unless services declined.  Counselled on the importance of diet, exercise, and its role in overall health and mortality. The patient's FH and SH was reviewed, including their home life, tobacco status, and drug and alcohol status.   Vaccines: Uptodate, flu  and tdap  S/P COVID x 2, consider shingrix and COVID booster. Nonsmoker    Prostate cancer screening no longer indicated. Lab Results  Component Value Date   PSA 0.70 03/12/2020   PSA 1.07 02/21/2019   PSA 0.64 02/13/2018  Colon cancer screening: Colonoscopy 01/2019 nml, repeat in 5 years. Hep C: done with eval for increase LFTs. HIV: neg test  Osteoporosis:  declining over last 2 years on fosamax..  changed to Prolia, given 06/08/20, 12/08/20.. repeat DEXA in 2  years.    Problem List Items Addressed This Visit     CAD (coronary artery disease) (Chronic)     Followed by Cardiologist.      Generalized anxiety disorder (Chronic)    Moderate control, chronic  Discussed changing fluoxetine to cymbalta given inadequate control and chronic pain issues. He will consider.      HLD (hyperlipidemia) (Chronic)    Stable, chronic.  Continue current medication.    atorvastatin 40 mg daily      Hypothyroidism (Chronic)    Stable, chronic.  Continue current medication.    levothyroxine 5 mg daily      IgA nephropathy (Chronic)    Stable, followed by nephrologist.      Iron deficiency anemia (Chronic)    Resolved.      OSA (obstructive sleep apnea) (Chronic)    Stable on inspira per pulmonary.      Peripheral neuropathy (Chronic)    Stable, chronic.  Continue current medication.    gabapentin 100 mg  2 tabs TID.      Osteoporosis without current pathological fracture    Now on prolia.[name] repeat DEXA 03/2022        [name], MD

## 2021-03-26 NOTE — Assessment & Plan Note (Addendum)
Moderate control, chronic  Discussed changing fluoxetine to cymbalta given inadequate control and chronic pain issues. He will consider.

## 2021-03-26 NOTE — Assessment & Plan Note (Signed)
Stable, chronic.  Continue current medication.    atorvastatin 40 mg daily. 

## 2021-03-26 NOTE — Assessment & Plan Note (Signed)
Stable on inspira per pulmonary.

## 2021-03-26 NOTE — Assessment & Plan Note (Signed)
Now on prolia.[name] repeat DEXA 03/2022

## 2021-03-26 NOTE — Assessment & Plan Note (Signed)
Resolved

## 2021-03-26 NOTE — Patient Instructions (Signed)
Keep healthy eating and regular exercise. Make an appt for discussion of mood if interest in medication change.. can consider change to Cymbalta.

## 2021-03-26 NOTE — Assessment & Plan Note (Signed)
Stable, chronic.  Continue current medication.    gabapentin 100 mg  2 tabs TID.

## 2021-03-26 NOTE — Assessment & Plan Note (Signed)
Stable, followed by nephrologist.

## 2021-03-26 NOTE — Assessment & Plan Note (Signed)
Stable, chronic.  Continue current medication.    levothyroxine 5 mg daily

## 2021-03-26 NOTE — Assessment & Plan Note (Signed)
Followed by Cardiologist.

## 2021-03-29 ENCOUNTER — Other Ambulatory Visit: Payer: Self-pay | Admitting: Family Medicine

## 2021-03-30 ENCOUNTER — Other Ambulatory Visit: Payer: Self-pay | Admitting: Family Medicine

## 2021-03-30 ENCOUNTER — Other Ambulatory Visit: Payer: Self-pay | Admitting: Cardiovascular Disease

## 2021-03-30 DIAGNOSIS — F32A Depression, unspecified: Secondary | ICD-10-CM

## 2021-03-30 DIAGNOSIS — F411 Generalized anxiety disorder: Secondary | ICD-10-CM

## 2021-04-05 ENCOUNTER — Other Ambulatory Visit: Payer: Self-pay | Admitting: Family Medicine

## 2021-04-05 NOTE — Telephone Encounter (Signed)
Refill request for ZOLPIDEM TARTRATE 10 MG TABLET  LOV - 03/26/21 Next OV - not scheduled Last refill - 03/03/21 #30/0 '

## 2021-04-19 ENCOUNTER — Other Ambulatory Visit: Payer: Self-pay | Admitting: Family Medicine

## 2021-04-26 NOTE — Progress Notes (Signed)
Cardiology Office Note  Date:  04/27/2021   ID:  Shawn Lam, DOB 18-Jul-1950, MRN [id number]  PCP:  Jinny Sanders, MD   Chief Complaint  Patient presents with   12 month follow up     Patient c/o fluttering in chest, chest pain and shortness of breath when lying down. Medications reviewed by the patient verbally.     HPI:  Shawn Lam is a 71 y.o. male with a hx of  CAD s/p PCI of the LAD and RCA,  orthostatic hypotension,  dyslipidemia EF of 50-55% with inferior HK.  lymphedema  mildly dilated aortic root at 38 mm by echo 2018,  moderate OSA, inspire device 12/08/2017 Who presents for routine follow-up of his coronary artery disease, orthostatic hypotension  Feel well, Got dizzy, Passed out around christmas 2022 , orthostatic, happened on standing Continues Taking lasix  1-2 a week Denies any leg swelling  Also reports having some palpitations when he lays down at night, might go for about an hour here and there twice a week Prior loop monitor data reviewed, no atrial fibrillation  Active, exercises Continues to take midodrine 3 times daily  EKG personally reviewed by myself on todays visit Normal sinus rhythm rate 67 bpm no significant ST-T wave changes  other past medical history reviewed Last seen in clinic July 16, 2019 COVID September 2021 Seen in the emergency room February 07, 2020 for chest pain Diagnosed with bronchitis, chronic respiratory failure with hypoxia  prior fall, fractured his left forearm  Echocardiogram with ejection fraction 60 to 65% in August 2020  Other past medical history reviewed In the hospital 11/2018 GI bleeding and chest pain vasovagal episode 2 days ago in the setting of GI bleed and chronic hypotension EGD with no complications this morning Case discussed with Dr. Allen Norris in detail, Clean-based ulcer no signs of clot or bleeding continue Florinef and midodrine at outpatient   Previous trial of CPAP but was intolerant and  then was referred to ENT for consideration of inspire device.  On 12/08/2017 he underwent placement of inspire device without complication.     PMH:   has a past medical history of Actinic keratosis (02/04/2021), Anemia, Anxiety, Asymptomatic LV dysfunction, Basal cell carcinoma (08/05/2020), Berger's disease, CAD (coronary artery disease) (06/2005), Depression, Dilated aortic root ([place]), Hematuria, Hyperlipidemia, Hypothyroidism, Insomnia, Left ureteral stone, Orthostatic hypotension, OSA (obstructive sleep apnea), PVC's (premature ventricular contractions) (07/07/2016), Renal disorder, Rosacea, and Urticaria.  PSH:    Past Surgical History:  Procedure Laterality Date   CARDIAC CATHETERIZATION  09/06/2013   [facility]   CARDIAC CATHETERIZATION     COLONOSCOPY WITH PROPOFOL N/A 01/21/2019   Procedure: COLONOSCOPY WITH PROPOFOL;  Surgeon: Lucilla Lame, MD;  Location: [facility];  Service: Endoscopy;  Laterality: N/A;  sleep apnea   CORONARY ANGIOPLASTY  2004   STENT PLACEMENT   CYSTOSCOPY WITH RETROGRADE PYELOGRAM, URETEROSCOPY AND STENT PLACEMENT Left 03/19/2014   Procedure: CYSTOSCOPY WITH RETROGRADE PYELOGRAM, URETEROSCOPY AND STENT PLACEMENT;  Surgeon: Junious Dresser, MD;  Location: [facility];  Service: Urology;  Laterality: Left;   CYSTOSCOPY WITH RETROGRADE PYELOGRAM, URETEROSCOPY AND STENT PLACEMENT Bilateral 05/20/2015   Procedure:  CYSTOSCOPY WITH BILATERAL RETROGRADE PYELOGRAM,RIGHT  DIAGNOSTIC URETEROSCOPY ,LEFT URETEROSCOPY WITH HOLMIUM LASER  AND BILATERAL  STENT PLACEMENT ;  Surgeon: Alexis Frock, MD;  Location: [facility];  Service: Urology;  Laterality: Bilateral;   DRUG INDUCED ENDOSCOPY N/A 10/20/2017   Procedure: DRUG INDUCED SLEEP ENDOSCOPY;  Surgeon: Melida Quitter, MD;  Location: [facility];  Service: ENT;  Laterality: N/A;   EP IMPLANTABLE DEVICE N/A 05/13/2016   Procedure: Loop Recorder Insertion;  Surgeon: Deboraha Sprang, MD;  Location: [facility];   Service: Cardiovascular;  Laterality: N/A;   ESOPHAGOGASTRODUODENOSCOPY (EGD) WITH PROPOFOL N/A 11/30/2018   Procedure: ESOPHAGOGASTRODUODENOSCOPY (EGD) WITH PROPOFOL;  Surgeon: Jonathon Bellows, MD;  Location: [facility];  Service: Gastroenterology;  Laterality: N/A;   ESOPHAGOGASTRODUODENOSCOPY (EGD) WITH PROPOFOL N/A 12/01/2018   Procedure: ESOPHAGOGASTRODUODENOSCOPY (EGD) WITH PROPOFOL;  Surgeon: Lucilla Lame, MD;  Location: [facility];  Service: Endoscopy;  Laterality: N/A;   HOLMIUM LASER APPLICATION Bilateral 11/09/173   Procedure: HOLMIUM LASER APPLICATION;  Surgeon: Alexis Frock, MD;  [facility];  Service: Urology;  Laterality: Bilateral;   IMPLIMENTATION OF A HYPOGLOSSAL NERVE STIMULATOR  12/08/2017   OSA    LEFT HEART CATHETERIZATION WITH CORONARY ANGIOGRAM N/A 09/06/2013   Procedure: LEFT HEART CATHETERIZATION WITH CORONARY ANGIOGRAM;  Surgeon: Sinclair Grooms, MD;  Location: [facility];  Service: Cardiovascular;  Laterality: N/A;   LEFT KNEE CAP  SURGERY ABOUT 40 YRS AGO  ? 1970     STONE EXTRACTION WITH BASKET Left 03/19/2014   Procedure: STONE EXTRACTION WITH BASKET;  Surgeon: Junious Dresser, MD;  Location[facility];  Service: Urology;  Laterality: Left;    Current Outpatient Medications  Medication Sig Dispense Refill   atorvastatin (LIPITOR) 80 MG tablet Take 1 tablet (80 mg total) by mouth daily. 90 tablet 3   busPIRone (BUSPAR) 10 MG tablet TAKE 2 TABLETS BY MOUTH 3 TIMES DAILY. 540 tablet 1   clopidogrel (PLAVIX) 75 MG tablet TAKE 1 TABLET DAILY 90 tablet 1   ferrous sulfate 325 (65 FE) MG tablet TAKE 1 TABLET BY MOUTH 3 TIMES DAILY WITH MEALS. 270 tablet 3   finasteride (PROSCAR) 5 MG tablet Take 5 mg by mouth daily.     FLUoxetine (PROZAC) 20 MG capsule TAKE 1 CAPSULE DAILY. TAKE WITH 40MG  TO EQUAL 60MG     ONCE DAILY 90 capsule 1   FLUoxetine (PROZAC) 40 MG capsule TAKE 1 CAPSULE EVERY       MORNING 90 capsule 1   furosemide (LASIX) 20 MG tablet TAKE 1  TABLET BY MOUTH EVERY DAY 90 tablet 0   gabapentin (NEURONTIN) 100 MG capsule TAKE TWO CAPSULES BY MOUTH TWICE A DAY 120 capsule 1   levothyroxine (SYNTHROID) 75 MCG tablet TAKE 1 TABLET BY MOUTH EVERY DAY BEFORE BREAKFAST 90 tablet 3   midodrine (PROAMATINE) 10 MG tablet Take 1 tablet (10 mg total) by mouth 3 (three) times daily. 270 tablet 3   mirtazapine (REMERON) 15 MG tablet TAKE 1 TABLET BY MOUTH EVERYDAY AT BEDTIME 90 tablet 1   nitroGLYCERIN (NITROSTAT) 0.4 MG SL tablet Place 1 tablet (0.4 mg total) under the tongue every 5 (five) minutes as needed for chest pain. 10 tablet 1   pantoprazole (PROTONIX) 40 MG tablet TAKE 1 TABLET BY MOUTH TWICE A DAY 180 tablet 3   tamsulosin (FLOMAX) 0.4 MG CAPS capsule TAKE 2 CAPSULES BY MOUTH EVERY DAY 180 capsule 0   triamcinolone cream (KENALOG) 0.1 % APPLY 1 APPLICATION TOPICALLY 2 (TWO) TIMES DAILY. AVOID FACE, GROIN, UNDERARMS. 454 g 0   zolpidem (AMBIEN) 10 MG tablet TAKE 1 TABLET BY MOUTH EVERY DAY AT BEDTIME AS NEEDED 30 tablet 0   No current facility-administered medications for this visit.     Allergies:   Codeine and Tape   Social  History:  The patient  reports that he has never smoked. He has never used smokeless tobacco. He reports that he does not currently use alcohol after a past usage of about 1.0 standard drink per week. He reports that he does not use drugs.   Family History:   family history includes Cancer in his father; Coronary artery disease in his mother; Diabetes in his brother; Heart attack in his mother; Heart disease in his mother; Hypertension in his mother; Lung cancer in his father.    Review of Systems: Review of Systems  Constitutional: Negative.   HENT: Negative.    Respiratory: Negative.    Cardiovascular:  Positive for palpitations.  Gastrointestinal: Negative.   Musculoskeletal: Negative.   Neurological: Negative.   Psychiatric/Behavioral: Negative.    All other systems reviewed and are  negative.  PHYSICAL EXAM: VS:  BP 110/60 (BP Location: Left Arm, Patient Position: Sitting, Cuff Size: Normal)    Pulse 67    Ht 6' (1.829 m)    Wt 170 lb (77.1 kg)    SpO2 99%    BMI 23.06 kg/m  , BMI Body mass index is 23.06 kg/m. GEN: Well nourished, well developed, in no acute distress HEENT: normal Neck: no JVD, carotid bruits, or masses Cardiac: RRR; no murmurs, rubs, or gallops,no edema  Respiratory:  clear to auscultation bilaterally, normal work of breathing GI: soft, nontender, nondistended, + BS MS: no deformity or atrophy Skin: warm and dry, no rash Neuro:  Strength and sensation are intact Psych: euthymic mood, full affect  Recent Labs: 03/19/2021: ALT 21; BUN 11; Creatinine, Ser 1.40; Hemoglobin 13.1; Platelets 151.0; Potassium 4.2; Sodium 143; TSH 1.32    Lipid Panel Lab Results  Component Value Date   CHOL 128 03/19/2021   HDL 73.70 03/19/2021   LDLCALC 45 03/19/2021   TRIG 46.0 03/19/2021      Wt Readings from Last 3 Encounters:  04/27/21 170 lb (77.1 kg)  03/26/21 165 lb (74.8 kg)  02/26/21 174 lb (78.9 kg)     ASSESSMENT AND PLAN:  Problem List Items Addressed This Visit       Cardiology Problems   HLD (hyperlipidemia) (Chronic)   CAD (coronary artery disease) - Primary (Chronic)   PVC's (premature ventricular contractions)   Orthostatic hypotension     Other   OSA (obstructive sleep apnea) (Chronic)   Other Visit Diagnoses     Leg edema            1.  OSA -   intolerant to CPAP therapy Has the inspire device implanted 2019, done well with this   2. CAD -  with stable angina s/p remote PCI of the LAD and RCA.   No anginal symptoms, cholesterol at goal   3.  Orthostatic hypotension  Continue midodrine 10 mg 3 times daily Take first thing in the morning Take with large glass of water in the morning, do not avoid salt Avoid weight loss  4.  Hyperlipidemia - Cholesterol is at goal on the current lipid regimen. No changes to the  medications were made.  5.  Palpitations Possibly from PVCs, appreciated at nighttime when lying down not in the daytime ZIO monitor offered, He prefers self monitoring with cardio mobile   COVID-19 pneumonia infection in September 2021 Recovered    Total encounter time more than 25 minutes  Greater than 50% was spent in counseling and coordination of care with the patient    Signed, Esmond Plants, M.D., Ph.D. [place]  [address], [place]

## 2021-04-27 ENCOUNTER — Other Ambulatory Visit: Payer: Self-pay

## 2021-04-27 ENCOUNTER — Encounter: Payer: Self-pay | Admitting: Cardiovascular Disease

## 2021-04-27 ENCOUNTER — Ambulatory Visit (INDEPENDENT_AMBULATORY_CARE_PROVIDER_SITE_OTHER): Payer: Medicare Other | Admitting: Cardiovascular Disease

## 2021-04-27 VITALS — BP 110/60 | HR 67 | Ht 72.0 in | Wt 170.0 lb

## 2021-04-27 DIAGNOSIS — R600 Localized edema: Secondary | ICD-10-CM | POA: Diagnosis not present

## 2021-04-27 DIAGNOSIS — G4733 Obstructive sleep apnea (adult) (pediatric): Secondary | ICD-10-CM

## 2021-04-27 DIAGNOSIS — I25118 Atherosclerotic heart disease of native coronary artery with other forms of angina pectoris: Secondary | ICD-10-CM

## 2021-04-27 DIAGNOSIS — I951 Orthostatic hypotension: Secondary | ICD-10-CM

## 2021-04-27 DIAGNOSIS — E782 Mixed hyperlipidemia: Secondary | ICD-10-CM

## 2021-04-27 DIAGNOSIS — I493 Ventricular premature depolarization: Secondary | ICD-10-CM

## 2021-04-27 MED ORDER — MIDODRINE HCL 10 MG PO TABS: 10.0000 mg | ORAL_TABLET | Freq: Three times a day (TID) | ORAL | 3 refills | Status: DC

## 2021-04-27 MED ORDER — FUROSEMIDE 20 MG PO TABS: 20.0000 mg | ORAL_TABLET | ORAL | 0 refills | Status: DC | PRN

## 2021-04-27 NOTE — Patient Instructions (Addendum)
Look for  KardiaMobile 1-Lead Personal EKG Monitor  [employer], [employer], [facility], [employer]...look online orders   Medication Instructions:  Please reduce Lasix 20 mg AS NEEDED Take sparingly for shortness of breath or for swelling  If you need a refill on your cardiac medications before your next appointment, please call your pharmacy.   Lab work: No new labs needed  Testing/Procedures: No new testing needed  Follow-Up: At [facility], you and your health needs are our priority.  As part of our continuing mission to provide you with exceptional heart care, we have created designated Provider Care Teams.  These Care Teams include your primary Cardiologist (physician) and Advanced Practice Providers (APPs -  Physician Assistants and Nurse Practitioners) who all work together to provide you with the care you need, when you need it.  You will need a follow up appointment in 12 months  Providers on your designated Care Team:   Murray Hodgkins, NP Christell Faith, PA-C Cadence Kathlen Mody, [place]  COVID-19 Vaccine Information can be found at: [email or web address].uk For questions related to vaccine distribution or appointments, please email [email or web address] or call [phone].

## 2021-04-28 ENCOUNTER — Other Ambulatory Visit: Payer: Self-pay | Admitting: Family Medicine

## 2021-04-28 ENCOUNTER — Encounter: Payer: Self-pay | Admitting: Cardiovascular Disease

## 2021-04-28 DIAGNOSIS — D3142 Benign neoplasm of left ciliary body: Secondary | ICD-10-CM | POA: Diagnosis not present

## 2021-04-28 DIAGNOSIS — Z961 Presence of intraocular lens: Secondary | ICD-10-CM | POA: Diagnosis not present

## 2021-04-28 DIAGNOSIS — H5213 Myopia, bilateral: Secondary | ICD-10-CM | POA: Diagnosis not present

## 2021-04-28 DIAGNOSIS — H52223 Regular astigmatism, bilateral: Secondary | ICD-10-CM | POA: Diagnosis not present

## 2021-04-28 DIAGNOSIS — H47013 Ischemic optic neuropathy, bilateral: Secondary | ICD-10-CM | POA: Diagnosis not present

## 2021-04-28 DIAGNOSIS — H524 Presbyopia: Secondary | ICD-10-CM | POA: Diagnosis not present

## 2021-04-28 DIAGNOSIS — H40013 Open angle with borderline findings, low risk, bilateral: Secondary | ICD-10-CM | POA: Diagnosis not present

## 2021-04-28 MED ORDER — MIRTAZAPINE 15 MG PO TABS: ORAL_TABLET | ORAL | 1 refills | Status: DC

## 2021-04-28 MED ORDER — BUSPIRONE HCL 10 MG PO TABS: ORAL_TABLET | ORAL | 1 refills | Status: DC

## 2021-04-28 NOTE — Telephone Encounter (Signed)
Last office visit 03/26/2021 for CPE.  Last refilled Zolpidem 04/06/21 for #30 with no refills.  Next Appt: 03/29/2022.

## 2021-04-28 NOTE — Telephone Encounter (Signed)
°  Encourage patient to contact the pharmacy for refills or they can request refills through [place]:  Please schedule appointment if longer than 1 year  NEXT APPOINTMENT DATE:09/16/21  MEDICATION:zolpidem (AMBIEN) 10 MG tablet,mirtazapine (REMERON) 15 MG tablet,busPIRone (BUSPAR) 10 MG tablet  Is the patient out of medication?   PHARMACY:[facility] [id number] Shawn Lam, Shawn Lam would like all his medication going there.  Let patient know to contact pharmacy at the end of the day to make sure medication is ready.  Please notify patient to allow 48-72 hours to process  CLINICAL FILLS OUT ALL BELOW:   LAST REFILL:  QTY:  REFILL DATE:    OTHER COMMENTS:    Okay for refill?  Please advise

## 2021-04-29 MED ORDER — ZOLPIDEM TARTRATE 10 MG PO TABS: ORAL_TABLET | ORAL | 0 refills | Status: DC

## 2021-05-03 DIAGNOSIS — M542 Cervicalgia: Secondary | ICD-10-CM | POA: Diagnosis not present

## 2021-05-05 ENCOUNTER — Other Ambulatory Visit: Payer: Self-pay | Admitting: Family Medicine

## 2021-05-05 DIAGNOSIS — F411 Generalized anxiety disorder: Secondary | ICD-10-CM

## 2021-05-05 DIAGNOSIS — F32A Depression, unspecified: Secondary | ICD-10-CM

## 2021-05-07 ENCOUNTER — Telehealth: Payer: Self-pay | Admitting: Family Medicine

## 2021-05-07 NOTE — Telephone Encounter (Signed)
Patient called requesting clarification on his current rx for Atorvastatin (Lipitor) 80 mg.  He recently recv'd a refill (05/05/21) for # 45 sig: 1/2 po qd; however previously (03/26/21) the rx was written # 90 sig: 1 po qd.  Horris did not notice the change in sig until he realized he was running low on medication since he has continued to take 1 daily.  At this time he would like a call back to confirm dosage, as well as request a refill to be sent to his local [facility] on [address] in [place]) - NOT mail order, please.  Thank you

## 2021-05-07 NOTE — Telephone Encounter (Signed)
Let pt know, I am not sure what happened. Call/send in 1 year supply of atorvastatin 80 mg 1 tablet daily #90, 3 RF

## 2021-05-10 MED ORDER — ATORVASTATIN CALCIUM 80 MG PO TABS: 80.0000 mg | ORAL_TABLET | Freq: Every day | ORAL | 3 refills | Status: DC

## 2021-05-10 NOTE — Telephone Encounter (Signed)
Pt called back and I made him aware of message from [name] and Dr Diona Browner

## 2021-05-10 NOTE — Telephone Encounter (Signed)
Left message for Shawn Lam that he should be taking Atorvastatin 80 mg one tablet daily.  I have sent in a new prescription to [facility] on [address] in [place] with correct dosing and quantity as instructed by Dr. Diona Browner.

## 2021-05-14 ENCOUNTER — Other Ambulatory Visit: Payer: Self-pay | Admitting: Family Medicine

## 2021-05-17 ENCOUNTER — Other Ambulatory Visit: Payer: Self-pay | Admitting: Family Medicine

## 2021-05-17 NOTE — Telephone Encounter (Signed)
Last office visit 03/26/2021 for CPE.  Last refilled 03/22/2021 for #120 with 1 refill.  Next Appt: 03/29/22 for CPE.

## 2021-05-23 ENCOUNTER — Other Ambulatory Visit: Payer: Self-pay | Admitting: Family Medicine

## 2021-05-30 ENCOUNTER — Other Ambulatory Visit: Payer: Self-pay | Admitting: Family Medicine

## 2021-06-02 ENCOUNTER — Other Ambulatory Visit: Payer: Self-pay | Admitting: Family Medicine

## 2021-06-02 NOTE — Telephone Encounter (Signed)
Last office visit 03/26/2021 for CPE.  Last refilled 04/29/2021 for #30 with no refills.  CPE scheduled 03/29/22.

## 2021-06-05 ENCOUNTER — Other Ambulatory Visit: Payer: Self-pay | Admitting: Family Medicine

## 2021-06-08 ENCOUNTER — Telehealth: Payer: Self-pay

## 2021-06-08 DIAGNOSIS — M816 Localized osteoporosis [Lequesne]: Secondary | ICD-10-CM

## 2021-06-08 NOTE — Telephone Encounter (Signed)
Benefits submitted-pending Next injection due 06/09/21 or after

## 2021-06-10 ENCOUNTER — Other Ambulatory Visit: Payer: Self-pay

## 2021-06-10 ENCOUNTER — Ambulatory Visit (INDEPENDENT_AMBULATORY_CARE_PROVIDER_SITE_OTHER): Payer: Medicare Other | Admitting: Family Medicine

## 2021-06-10 VITALS — BP 106/70 | HR 71 | Temp 98.1°F | Resp 16 | Ht 69.0 in | Wt 166.6 lb

## 2021-06-10 DIAGNOSIS — R0989 Other specified symptoms and signs involving the circulatory and respiratory systems: Secondary | ICD-10-CM

## 2021-06-10 DIAGNOSIS — I25118 Atherosclerotic heart disease of native coronary artery with other forms of angina pectoris: Secondary | ICD-10-CM

## 2021-06-10 DIAGNOSIS — R09A2 Foreign body sensation, throat: Secondary | ICD-10-CM | POA: Insufficient documentation

## 2021-06-10 MED ORDER — PANTOPRAZOLE SODIUM 40 MG PO TBEC: 40.0000 mg | DELAYED_RELEASE_TABLET | Freq: Every day | ORAL | 3 refills | Status: DC

## 2021-06-10 NOTE — Patient Instructions (Addendum)
Avoid acidic foods... decrease caffeine, citrus, tomato, spicy foods, soda, alcohol, peppermint. ? Start pantoprazole 40 mg daily x 4-6 weeks, but call if if zero improvement in 2 week. ? ?

## 2021-06-10 NOTE — Assessment & Plan Note (Signed)
Acute ? ?Given symptomatology this is most likely due to inadequately controlled reflux.  Recommended avoidance of acidic foods/NSAIDS.  Will have him start pantoprazole 40 mg daily (it appears on med list he was given pantoprazole in the past but does not recall ever taking it and is no longer on it).  If not improving consider thyroid ultrasound although no mass noted on exam today.  Also if not improving consider GI referral and endoscopy to evaluate further. ?

## 2021-06-10 NOTE — Progress Notes (Signed)
? ? Patient ID: Shawn Lam, male    DOB: [birth year], 71 y.o.   MRN: [id number] ? ?This visit was conducted in person. ? ?BP 106/70   Pulse 71   Temp 98.1 ?F (36.7 ?C)   Resp 16   Ht 5\' 9"  (1.753 m)   Wt 166 lb 9.6 oz (75.6 kg)   SpO2 99%   BMI 24.60 kg/m?   ? ?CC:  ?Chief Complaint  ?Patient presents with  ? Pain in throat  ? ? ?Subjective:  ? ?HPI: ?Shawn Lam is a 71 y.o. male presenting on 06/10/2021 for Pain in throat ? ? He states he has had intermittent sensation of something in his throat for 4 months... gradually worsening or more frequent. ? Feel like pill or something stuck in throat. ? Sore in left lateral neck. ? Starts with hoarse voice. ?Has some burning in anterior chest. ? ? Symptoms are worse in AMs... starts improving later in the day. ? Alka-seltzer  did not help. ? ?No trouble swallowing solid or liquids. ? ? Has not tried reflux meds otherwise. No past endoscopy. ? ? No NSAID use. ? ?   ? ?Relevant past medical, surgical, family and social history reviewed and updated as indicated. Interim medical history since our last visit reviewed. ?Allergies and medications reviewed and updated. ?Outpatient Medications Prior to Visit  ?Medication Sig Dispense Refill  ? atorvastatin (LIPITOR) 80 MG tablet Take 1 tablet (80 mg total) by mouth daily. 90 tablet 3  ? busPIRone (BUSPAR) 10 MG tablet TAKE 2 TABLETS BY MOUTH 3 TIMES DAILY. 540 tablet 1  ? clopidogrel (PLAVIX) 75 MG tablet TAKE 1 TABLET DAILY 90 tablet 1  ? ferrous sulfate 325 (65 FE) MG tablet TAKE 1 TABLET BY MOUTH 3 TIMES DAILY WITH MEALS. 270 tablet 3  ? finasteride (PROSCAR) 5 MG tablet Take 5 mg by mouth daily.    ? FLUoxetine (PROZAC) 20 MG capsule TAKE 1 CAPSULE DAILY. TAKE WITH 40MG  TO EQUAL 60MG     ONCE DAILY 90 capsule 1  ? FLUoxetine (PROZAC) 40 MG capsule TAKE 1 CAPSULE EVERY       MORNING 90 capsule 1  ? furosemide (LASIX) 20 MG tablet Take 1 tablet (20 mg total) by mouth as needed. For swelling and shortness of breath,  take "sparingly" 90 tablet 0  ? gabapentin (NEURONTIN) 100 MG capsule TAKE TWO CAPSULES BY MOUTH TWICE A DAY 120 capsule 1  ? levothyroxine (SYNTHROID) 75 MCG tablet TAKE 1 TABLET BY MOUTH EVERY DAY BEFORE BREAKFAST 90 tablet 3  ? midodrine (PROAMATINE) 10 MG tablet Take 1 tablet (10 mg total) by mouth 3 (three) times daily. 270 tablet 3  ? mirtazapine (REMERON) 15 MG tablet TAKE 1 TABLET BY MOUTH EVERYDAY AT BEDTIME 90 tablet 1  ? nitroGLYCERIN (NITROSTAT) 0.4 MG SL tablet Place 1 tablet (0.4 mg total) under the tongue every 5 (five) minutes as needed for chest pain. 10 tablet 1  ? pantoprazole (PROTONIX) 40 MG tablet TAKE 1 TABLET BY MOUTH TWICE A DAY 180 tablet 3  ? tamsulosin (FLOMAX) 0.4 MG CAPS capsule TAKE 2 CAPSULES BY MOUTH EVERY DAY 180 capsule 0  ? triamcinolone cream (KENALOG) 0.1 % APPLY 1 APPLICATION TOPICALLY 2 (TWO) TIMES DAILY. AVOID FACE, GROIN, UNDERARMS. 454 g 0  ? zolpidem (AMBIEN) 10 MG tablet TAKE ONE TABLET BY MOUTH EVERY NIGHT AT BEDTIME AS NEEDED 30 tablet 0  ? ?No facility-administered medications prior to visit.  ?  ? ?Per  HPI unless specifically indicated in ROS section below ?Review of Systems  ?Constitutional:  Negative for fatigue and fever.  ?HENT:  Negative for ear pain.   ?Eyes:  Negative for pain.  ?Respiratory:  Negative for cough and shortness of breath.   ?Cardiovascular:  Negative for chest pain, palpitations and leg swelling.  ?Gastrointestinal:  Negative for abdominal pain, blood in stool, constipation, diarrhea and nausea.  ?Genitourinary:  Negative for dysuria.  ?Musculoskeletal:  Negative for arthralgias.  ?Neurological:  Negative for syncope, light-headedness and headaches.  ?Psychiatric/Behavioral:  Negative for dysphoric mood.   ?Objective:  ?BP 106/70   Pulse 71   Temp 98.1 ?F (36.7 ?C)   Resp 16   Ht 5\' 9"  (1.753 m)   Wt 166 lb 9.6 oz (75.6 kg)   SpO2 99%   BMI 24.60 kg/m?   ?Wt Readings from Last 3 Encounters:  ?06/10/21 166 lb 9.6 oz (75.6 kg)  ?04/27/21 170  lb (77.1 kg)  ?03/26/21 165 lb (74.8 kg)  ?  ?  ?Physical Exam ?Constitutional:   ?   Appearance: He is well-developed.  ?HENT:  ?   Head: Normocephalic.  ?   Right Ear: Hearing normal.  ?   Left Ear: Hearing normal.  ?   Nose: Nose normal.  ?Neck:  ?   Thyroid: No thyroid mass or thyromegaly.  ?   Vascular: No carotid bruit.  ?   Trachea: Trachea normal.  ?Cardiovascular:  ?   Rate and Rhythm: Normal rate and regular rhythm.  ?   Pulses: Normal pulses.  ?   Heart sounds: Heart sounds not distant. No murmur heard. ?  No friction rub. No gallop.  ?   Comments: No peripheral edema ?Pulmonary:  ?   Effort: Pulmonary effort is normal. No respiratory distress.  ?   Breath sounds: Normal breath sounds.  ?Skin: ?   General: Skin is warm and dry.  ?   Findings: No rash.  ?Psychiatric:     ?   Speech: Speech normal.     ?   Behavior: Behavior normal.     ?   Thought Content: Thought content normal.  ? ?   ?Results for orders placed or performed in visit on 03/19/21  ?CBC with Differential/Platelet  ?Result Value Ref Range  ? WBC 5.9 4.0 - 10.5 K/uL  ? RBC 4.09 (L) 4.22 - 5.81 Mil/uL  ? Hemoglobin 13.1 13.0 - 17.0 g/dL  ? HCT 40.2 39.0 - 52.0 %  ? MCV 98.3 78.0 - 100.0 fl  ? MCHC 32.7 30.0 - 36.0 g/dL  ? RDW 14.8 11.5 - 15.5 %  ? Platelets 151.0 150.0 - 400.0 K/uL  ? Neutrophils Relative % 68.1 43.0 - 77.0 %  ? Lymphocytes Relative 20.0 12.0 - 46.0 %  ? Monocytes Relative 9.3 3.0 - 12.0 %  ? Eosinophils Relative 1.9 0.0 - 5.0 %  ? Basophils Relative 0.7 0.0 - 3.0 %  ? Neutro Abs 4.0 1.4 - 7.7 K/uL  ? Lymphs Abs 1.2 0.7 - 4.0 K/uL  ? Monocytes Absolute 0.5 0.1 - 1.0 K/uL  ? Eosinophils Absolute 0.1 0.0 - 0.7 K/uL  ? Basophils Absolute 0.0 0.0 - 0.1 K/uL  ?T3, free  ?Result Value Ref Range  ? T3, Free 2.5 2.3 - 4.2 pg/mL  ?T4, free  ?Result Value Ref Range  ? Free T4 0.83 0.60 - 1.60 ng/dL  ?TSH  ?Result Value Ref Range  ? TSH 1.32 0.35 - 5.50 uIU/mL  ?  Comprehensive metabolic panel  ?Result Value Ref Range  ? Sodium 143 135 -  145 mEq/L  ? Potassium 4.2 3.5 - 5.1 mEq/L  ? Chloride 109 96 - 112 mEq/L  ? CO2 26 19 - 32 mEq/L  ? Glucose, Bld 82 70 - 99 mg/dL  ? BUN 11 6 - 23 mg/dL  ? Creatinine, Ser 1.40 0.40 - 1.50 mg/dL  ? Total Bilirubin 0.7 0.2 - 1.2 mg/dL  ? Alkaline Phosphatase 73 39 - 117 U/L  ? AST 32 0 - 37 U/L  ? ALT 21 0 - 53 U/L  ? Total Protein 6.0 6.0 - 8.3 g/dL  ? Albumin 3.7 3.5 - 5.2 g/dL  ? GFR 50.84 (L) >60.00 mL/min  ? Calcium 8.6 8.4 - 10.5 mg/dL  ?Lipid panel  ?Result Value Ref Range  ? Cholesterol 128 0 - 200 mg/dL  ? Triglycerides 46.0 0.0 - 149.0 mg/dL  ? HDL 73.70 >39.00 mg/dL  ? VLDL 9.2 0.0 - 40.0 mg/dL  ? LDL Cholesterol 45 0 - 99 mg/dL  ? Total CHOL/HDL Ratio 2   ? NonHDL 54.12   ? ? ?This visit occurred during the SARS-CoV-2 public health emergency.  Safety protocols were in place, including screening questions prior to the visit, additional usage of staff PPE, and extensive cleaning of exam room while observing appropriate contact time as indicated for disinfecting solutions.  ? ?COVID 19 screen:  No recent travel or known exposure to [place] ?The patient denies respiratory symptoms of COVID 19 at this time. ?The importance of social distancing was discussed today.  ? ?Assessment and Plan ?Problem List Items Addressed This Visit   ? ? Foreign body sensation in throat - Primary  ?  Acute ? ?Given symptomatology this is most likely due to inadequately controlled reflux.  Recommended avoidance of acidic foods/NSAIDS.  Will have him start pantoprazole 40 mg daily (it appears on med list he was given pantoprazole in the past but does not recall ever taking it and is no longer on it).  If not improving consider thyroid ultrasound although no mass noted on exam today.  Also if not improving consider GI referral and endoscopy to evaluate further. ?  ?  ? ? ?  ? ?[name], MD  ? ?

## 2021-06-11 ENCOUNTER — Encounter: Payer: Self-pay | Admitting: Family Medicine

## 2021-06-17 NOTE — Addendum Note (Signed)
Addended by: Kris Mouton on: 06/17/2021 02:40 PM ? ? Modules accepted: Orders ? ?

## 2021-06-17 NOTE — Telephone Encounter (Signed)
Pt called in wants to know the status with insurance to receive his injection  # [phone] Please advise ?

## 2021-06-17 NOTE — Telephone Encounter (Signed)
OOP cost is $0. Patient advised. ?Lab appointment on 06/18/21 and NV 06/22/21 ?

## 2021-06-18 ENCOUNTER — Other Ambulatory Visit: Payer: Self-pay

## 2021-06-18 ENCOUNTER — Other Ambulatory Visit (INDEPENDENT_AMBULATORY_CARE_PROVIDER_SITE_OTHER): Payer: Medicare Other

## 2021-06-18 ENCOUNTER — Encounter: Payer: Self-pay | Admitting: Family Medicine

## 2021-06-18 DIAGNOSIS — M816 Localized osteoporosis [Lequesne]: Secondary | ICD-10-CM

## 2021-06-18 LAB — BASIC METABOLIC PANEL
BUN: 14 mg/dL (ref 6–23)
CO2: 27 mEq/L (ref 19–32)
Calcium: 9.5 mg/dL (ref 8.4–10.5)
Chloride: 106 mEq/L (ref 96–112)
Creatinine, Ser: 1.84 mg/dL — ABNORMAL HIGH (ref 0.40–1.50)
GFR: 36.56 mL/min — ABNORMAL LOW (ref >60.00)
Glucose, Bld: 94 mg/dL (ref 70–99)
Potassium: 4.8 mEq/L (ref 3.5–5.1)
Sodium: 142 mEq/L (ref 135–145)

## 2021-06-22 ENCOUNTER — Ambulatory Visit: Payer: Medicare Other

## 2021-06-22 NOTE — Telephone Encounter (Signed)
NV cancelled. Re checking labs on 06/25/21 before proceeding with injection ?

## 2021-06-25 ENCOUNTER — Other Ambulatory Visit: Payer: Self-pay

## 2021-06-25 ENCOUNTER — Other Ambulatory Visit (INDEPENDENT_AMBULATORY_CARE_PROVIDER_SITE_OTHER): Payer: Medicare Other

## 2021-06-25 ENCOUNTER — Encounter: Payer: Self-pay | Admitting: Family Medicine

## 2021-06-25 DIAGNOSIS — R7989 Other specified abnormal findings of blood chemistry: Secondary | ICD-10-CM

## 2021-06-25 LAB — BASIC METABOLIC PANEL
BUN: 18 mg/dL (ref 6–23)
CO2: 28 mEq/L (ref 19–32)
Calcium: 9.3 mg/dL (ref 8.4–10.5)
Chloride: 107 mEq/L (ref 96–112)
Creatinine, Ser: 1.74 mg/dL — ABNORMAL HIGH (ref 0.40–1.50)
GFR: 39.09 mL/min — ABNORMAL LOW (ref >60.00)
Glucose, Bld: 98 mg/dL (ref 70–99)
Potassium: 4.6 mEq/L (ref 3.5–5.1)
Sodium: 142 mEq/L (ref 135–145)

## 2021-06-29 ENCOUNTER — Encounter: Payer: Self-pay | Admitting: Family Medicine

## 2021-06-30 ENCOUNTER — Telehealth: Payer: Self-pay

## 2021-06-30 NOTE — Progress Notes (Signed)
? ? ?Chronic Care Management ?Pharmacy Assistant  ? ?Name: Shawn Lam  MRN: [id number] DOB: 11/06/1950 ? ?Reason for Encounter: CCM (General Adherence) ?  ?Recent office visits:  ?03/26/2021 [name], MD Annual Exam No med changes.  ? ?Recent consult visits:  ?05/03/2021 Danae Orleans, PA Neck pain No other info.  ?04/27/2021 Ida Rogue, MD (Cardiology): Coronary artery disease Change: Lasix 20 mg PRN ?03/19/2021 Alexis Frock (Urology): Calculus of Kidney No other info.  ? ?Hospital visits:  ?None in previous 6 months ? ?Medications: ?Outpatient Encounter Medications as of 06/30/2021  ?Medication Sig  ? atorvastatin (LIPITOR) 80 MG tablet Take 1 tablet (80 mg total) by mouth daily.  ? busPIRone (BUSPAR) 10 MG tablet TAKE 2 TABLETS BY MOUTH 3 TIMES DAILY.  ? clopidogrel (PLAVIX) 75 MG tablet TAKE 1 TABLET DAILY  ? denosumab (PROLIA) 60 MG/ML SOSY injection Inject 60 mg into the skin every 6 (six) months. Do not send RX, order this from the office  ? ferrous sulfate 325 (65 FE) MG tablet TAKE 1 TABLET BY MOUTH 3 TIMES DAILY WITH MEALS.  ? finasteride (PROSCAR) 5 MG tablet Take 5 mg by mouth daily.  ? FLUoxetine (PROZAC) 20 MG capsule TAKE 1 CAPSULE DAILY. TAKE WITH '40MG'$  TO EQUAL '60MG'$     ONCE DAILY  ? FLUoxetine (PROZAC) 40 MG capsule TAKE 1 CAPSULE EVERY       MORNING  ? furosemide (LASIX) 20 MG tablet Take 1 tablet (20 mg total) by mouth as needed. For swelling and shortness of breath, take "sparingly"  ? gabapentin (NEURONTIN) 100 MG capsule TAKE TWO CAPSULES BY MOUTH TWICE A DAY  ? levothyroxine (SYNTHROID) 75 MCG tablet TAKE 1 TABLET BY MOUTH EVERY DAY BEFORE BREAKFAST  ? midodrine (PROAMATINE) 10 MG tablet Take 1 tablet (10 mg total) by mouth 3 (three) times daily.  ? mirtazapine (REMERON) 15 MG tablet TAKE 1 TABLET BY MOUTH EVERYDAY AT BEDTIME  ? nitroGLYCERIN (NITROSTAT) 0.4 MG SL tablet Place 1 tablet (0.4 mg total) under the tongue every 5 (five) minutes as needed for chest pain.  ? pantoprazole  (PROTONIX) 40 MG tablet Take 1 tablet (40 mg total) by mouth daily.  ? tamsulosin (FLOMAX) 0.4 MG CAPS capsule TAKE 2 CAPSULES BY MOUTH EVERY DAY  ? triamcinolone cream (KENALOG) 0.1 % APPLY 1 APPLICATION TOPICALLY 2 (TWO) TIMES DAILY. AVOID FACE, GROIN, UNDERARMS.  ? zolpidem (AMBIEN) 10 MG tablet TAKE ONE TABLET BY MOUTH EVERY NIGHT AT BEDTIME AS NEEDED  ? ?No facility-administered encounter medications on file as of 06/30/2021.  ? ?Contacted [place] on 07/07/2021 for general disease state and medication adherence call.  ? ?Patient is not more than 5 days past due for refill on the following medications per chart history: ? ?[facility] Medications: ?Medication Name/mg Last Fill Days Supply ?Atorvastatin 80 mg  05/10/2021 90 ? ?What concerns do you have about your medications? No concerns ? ?The patient denies side effects with their medications.  ? ?How often do you forget or accidentally miss a dose? Never ? ?Do you use a pillbox? Yes ? ?Are you having any problems getting your medications from your pharmacy? No ? ?Has the cost of your medications been a concern? No ? ?Since last visit with CPP, the following interventions have been made.  ? ?The patient has not had an ED visit since last contact.  ? ?The patient denies problems with their health.  ? ?Patient denies concerns or questions for Charlene Brooke, PharmD at this time.  ? ?  Care Gaps: ?Annual wellness visit in last year? Yes 02/26/2021 ?Most Recent BP reading: 106/70 on 06/10/2021 ? ?If Diabetic: ?Most recent A1C reading: 5.1 on 08/11/2016 ?Last eye exam / retinopathy screening: Up to date ?Last diabetic foot exam: Up to date ? ?Upcoming appointments: ?CCM appointment on 09/16/2021  ? ?Charlene Brooke, CPP notified ? ?Marijean Niemann, RMA ?Clinical Pharmacy Assistant ?[phone] ? ? ? ? ? ? ?

## 2021-07-01 ENCOUNTER — Other Ambulatory Visit: Payer: Self-pay | Admitting: Family Medicine

## 2021-07-01 NOTE — Telephone Encounter (Signed)
Last office visit 06/10/2021 for foreign body sensation in throat.  Last refilled 06/02/2021 for #30 with no refills.  CPE scheduled for 03/29/2022. ?

## 2021-07-06 DIAGNOSIS — R948 Abnormal results of function studies of other organs and systems: Secondary | ICD-10-CM | POA: Diagnosis not present

## 2021-07-06 NOTE — Telephone Encounter (Signed)
See lab notes. Ok to proceed. Nurse visit made for 07/14/21 ?CrCl is 41.64 mL/min  ?Calcium 9.3 ?

## 2021-07-08 DIAGNOSIS — Z20822 Contact with and (suspected) exposure to covid-19: Secondary | ICD-10-CM | POA: Diagnosis not present

## 2021-07-13 ENCOUNTER — Other Ambulatory Visit: Payer: Self-pay | Admitting: Family Medicine

## 2021-07-13 DIAGNOSIS — R82998 Other abnormal findings in urine: Secondary | ICD-10-CM | POA: Diagnosis not present

## 2021-07-13 DIAGNOSIS — R3915 Urgency of urination: Secondary | ICD-10-CM | POA: Diagnosis not present

## 2021-07-13 DIAGNOSIS — R34 Anuria and oliguria: Secondary | ICD-10-CM | POA: Diagnosis not present

## 2021-07-13 DIAGNOSIS — N200 Calculus of kidney: Secondary | ICD-10-CM | POA: Diagnosis not present

## 2021-07-13 NOTE — Telephone Encounter (Signed)
Last office visit 06/10/21 foreign body sensation in throat.  Last refilled 05/18/2021 for #120 with 1 refill.  CPE scheduled 03/29/2022. ?

## 2021-07-14 ENCOUNTER — Telehealth: Payer: Self-pay | Admitting: Cardiovascular Disease

## 2021-07-14 ENCOUNTER — Other Ambulatory Visit: Payer: Self-pay | Admitting: Urology

## 2021-07-14 ENCOUNTER — Ambulatory Visit (INDEPENDENT_AMBULATORY_CARE_PROVIDER_SITE_OTHER): Payer: Medicare Other

## 2021-07-14 DIAGNOSIS — M816 Localized osteoporosis [Lequesne]: Secondary | ICD-10-CM

## 2021-07-14 MED ORDER — DENOSUMAB 60 MG/ML ~~LOC~~ SOSY
60.0000 mg | PREFILLED_SYRINGE | Freq: Once | SUBCUTANEOUS | Status: AC
  Administered 2021-07-14: 60 mg via SUBCUTANEOUS

## 2021-07-14 NOTE — Progress Notes (Signed)
Per orders of Dr. Glori Bickers, in the absence of Dr. Diona Browner, injection of Prolia given by Loreen Freud. ?Patient tolerated injection well.  ?

## 2021-07-14 NOTE — Telephone Encounter (Signed)
? ?  Pre-operative Risk Assessment  ?  ?Patient Name: Shawn Lam  ?DOB: [birth year] ?MRN: [id number]  ? ?  ? ?Request for Surgical Clearance   ? ?Procedure:  TURP  ? ?Date of Surgery:  Clearance 08/25/21                              ?   ?Surgeon:  Dr. Tresa Moore ?Surgeon's Group or Practice Name:  [facility] ?Phone number:  [phone] ?Fax number:  [phone] ?  ?Type of Clearance Requested:   ?- Medical  ?- Pharmacy:  Hold Aspirin 5 days  , Hold Plavix 1 week prior to surgery and 1 week post surgery ?  ?Type of Anesthesia:  General  ?  ?Additional requests/questions:   ? ?Signed, ?Johnna Acosta   ?07/14/2021, 2:16 PM   ?

## 2021-07-15 NOTE — Telephone Encounter (Signed)
? ?  Patient Name: Shawn Lam  ?DOB: [birth year] ?MRN: [id number] ? ?Primary Cardiologist: Ida Rogue, MD ? ?Chart reviewed as part of pre-operative protocol coverage.  ? ?71 year old male with a history of CAD s/p remote PCI-LAD and RCA, orthostatic hypotension, palpitations, hyperlipidemia, and OSA s/p inspire device in 11/2017. ? ?Stress test in 2020 was low risk.  Echocardiogram at the time showed 60 to 65%, no RWMA.  In the office on 04/27/2021 and was stable overall from a cardiac standpoint.  He did report palpitations, declined ZIO monitor.  He denied symptoms concerning for angina. ? ?Surgical clearance request received for TURP scheduled for 08/25/2021 with Dr. Tresa Moore with [facility]. ? ?Dr. Rockey Situ, please advise on holding aspirin 5 days prior to procedure, and holding Plavix 1 week prior to procedure AND 1 week post procedure. ? ?Please route response to P CV DIV PREOP.  ? ?Thank you.  ? ?Lenna Sciara, NP ?07/15/2021, 10:42 AM ? ? ?

## 2021-07-19 ENCOUNTER — Telehealth: Payer: Self-pay | Admitting: *Deleted

## 2021-07-19 NOTE — Telephone Encounter (Signed)
Primary Cardiologist:Timothy Rockey Situ, MD ? ?Chart reviewed as part of pre-operative protocol coverage. Because of Rayd[facility] past medical history and time since last visit, he/she will require a virtual visit/telephone call in order to better assess preoperative cardiovascular risk. ? ?Pre-op covering staff: ?- Please contact patient, obtain consent, and schedule appointment  ? ? ?Acceptable risk for procedure, no further cardiac testing needed  ?He can hold Plavix 5 to 7 days prior to procedure, Dr. Tresa Moore can determine when to restart clopidogrel following procedure  ?He does have prior stenting x2, if possible we will try to minimize time off aspirin 81 daily.  ?If unable to perform the procedure on low-dose aspirin, this can certainly be held but the patient should be aware of small but not insignificant risk given history of coronary disease, prior stenting  ?  ?Will CC Dr. Tresa Moore to see it is possible to perform procedure on low-dose aspirin  ?Thx  ?TGollan  ? ? ? ?Emmaline Life, NP-C ? ?  ?07/19/2021, 1:42 PM ?[facility] ?[address] ?Office [phone] Fax [phone] ? ?

## 2021-07-19 NOTE — Telephone Encounter (Signed)
Pt has been scheduled for a telephone preop appointment, 08/03/21 11:00. ?

## 2021-07-19 NOTE — Telephone Encounter (Signed)
Pt has been scheduled for a telephone preop appointment, 08/03/21 11:00.  Medications reconciled / Consent on file.  ? ?  ?Patient Consent for Virtual Visit  ? ? ?   ? ?Shawn Lam has provided verbal consent on 07/19/2021 for a virtual visit (video or telephone). ? ? ?CONSENT FOR VIRTUAL VISIT FOR:  Shawn Lam  ?By participating in this virtual visit I agree to the following: ? ?I hereby voluntarily request, consent and authorize [place] and its employed or contracted physicians, physician assistants, nurse practitioners or other licensed health care professionals (the Practitioner), to provide me with telemedicine health care services (the ?Services") as deemed necessary by the treating Practitioner. I acknowledge and consent to receive the Services by the Practitioner via telemedicine. I understand that the telemedicine visit will involve communicating with the Practitioner through live audiovisual communication technology and the disclosure of certain medical information by electronic transmission. I acknowledge that I have been given the opportunity to request an in-person assessment or other available alternative prior to the telemedicine visit and am voluntarily participating in the telemedicine visit. ? ?I understand that I have the right to withhold or withdraw my consent to the use of telemedicine in the course of my care at any time, without affecting my right to future care or treatment, and that the Practitioner or I may terminate the telemedicine visit at any time. I understand that I have the right to inspect all information obtained and/or recorded in the course of the telemedicine visit and may receive copies of available information for a reasonable fee.  I understand that some of the potential risks of receiving the Services via telemedicine include:  ?Delay or interruption in medical evaluation due to technological equipment failure or disruption; ?Information transmitted may not be  sufficient (e.g. poor resolution of images) to allow for appropriate medical decision making by the Practitioner; and/or  ?In rare instances, security protocols could fail, causing a breach of personal health information. ? ?Furthermore, I acknowledge that it is my responsibility to provide information about my medical history, conditions and care that is complete and accurate to the best of my ability. I acknowledge that Practitioner's advice, recommendations, and/or decision may be based on factors not within their control, such as incomplete or inaccurate data provided by me or distortions of diagnostic images or specimens that may result from electronic transmissions. I understand that the practice of medicine is not an exact science and that Practitioner makes no warranties or guarantees regarding treatment outcomes. I acknowledge that a copy of this consent can be made available to me via my patient portal ([place]), or I can request a printed copy by calling the office of [place].   ? ?I understand that my insurance will be billed for this visit.  ? ?I have read or had this consent read to me. ?I understand the contents of this consent, which adequately explains the benefits and risks of the Services being provided via telemedicine.  ?I have been provided ample opportunity to ask questions regarding this consent and the Services and have had my questions answered to my satisfaction. ?I give my informed consent for the services to be provided through the use of telemedicine in my medical care ? ? ? ?

## 2021-07-30 ENCOUNTER — Encounter: Payer: Self-pay | Admitting: Family Medicine

## 2021-07-30 ENCOUNTER — Telehealth: Payer: Self-pay | Admitting: Cardiovascular Disease

## 2021-07-30 ENCOUNTER — Ambulatory Visit (INDEPENDENT_AMBULATORY_CARE_PROVIDER_SITE_OTHER): Payer: Medicare Other | Admitting: Family Medicine

## 2021-07-30 VITALS — BP 128/72 | HR 77 | Temp 97.7°F | Ht 73.0 in | Wt 170.4 lb

## 2021-07-30 DIAGNOSIS — I951 Orthostatic hypotension: Secondary | ICD-10-CM

## 2021-07-30 DIAGNOSIS — F411 Generalized anxiety disorder: Secondary | ICD-10-CM | POA: Diagnosis not present

## 2021-07-30 DIAGNOSIS — Z8719 Personal history of other diseases of the digestive system: Secondary | ICD-10-CM | POA: Diagnosis not present

## 2021-07-30 DIAGNOSIS — I25118 Atherosclerotic heart disease of native coronary artery with other forms of angina pectoris: Secondary | ICD-10-CM

## 2021-07-30 DIAGNOSIS — D509 Iron deficiency anemia, unspecified: Secondary | ICD-10-CM

## 2021-07-30 DIAGNOSIS — R55 Syncope and collapse: Secondary | ICD-10-CM

## 2021-07-30 DIAGNOSIS — R0989 Other specified symptoms and signs involving the circulatory and respiratory systems: Secondary | ICD-10-CM

## 2021-07-30 LAB — COMPREHENSIVE METABOLIC PANEL
ALT: 30 U/L (ref 0–53)
AST: 41 U/L — ABNORMAL HIGH (ref 0–37)
Albumin: 4 g/dL (ref 3.5–5.2)
Alkaline Phosphatase: 66 U/L (ref 39–117)
BUN: 14 mg/dL (ref 6–23)
CO2: 26 mEq/L (ref 19–32)
Calcium: 8.5 mg/dL (ref 8.4–10.5)
Chloride: 109 mEq/L (ref 96–112)
Creatinine, Ser: 1.6 mg/dL — ABNORMAL HIGH (ref 0.40–1.50)
GFR: 43.2 mL/min — ABNORMAL LOW (ref >60.00)
Glucose, Bld: 109 mg/dL — ABNORMAL HIGH (ref 70–99)
Potassium: 4.5 mEq/L (ref 3.5–5.1)
Sodium: 143 mEq/L (ref 135–145)
Total Bilirubin: 0.6 mg/dL (ref 0.2–1.2)
Total Protein: 5.9 g/dL — ABNORMAL LOW (ref 6.0–8.3)

## 2021-07-30 LAB — CBC WITH DIFFERENTIAL/PLATELET
Basophils Absolute: 0.1 10*3/uL (ref 0.0–0.1)
Basophils Relative: 1.2 % (ref 0.0–3.0)
Eosinophils Absolute: 0.1 10*3/uL (ref 0.0–0.7)
Eosinophils Relative: 2.6 % (ref 0.0–5.0)
HCT: 38.9 % — ABNORMAL LOW (ref 39.0–52.0)
Hemoglobin: 12.9 g/dL — ABNORMAL LOW (ref 13.0–17.0)
Lymphocytes Relative: 25.9 % (ref 12.0–46.0)
Lymphs Abs: 1.4 10*3/uL (ref 0.7–4.0)
MCHC: 33.2 g/dL (ref 30.0–36.0)
MCV: 99.2 fl (ref 78.0–100.0)
Monocytes Absolute: 0.5 10*3/uL (ref 0.1–1.0)
Monocytes Relative: 9.5 % (ref 3.0–12.0)
Neutro Abs: 3.3 10*3/uL (ref 1.4–7.7)
Neutrophils Relative %: 60.8 % (ref 43.0–77.0)
Platelets: 162 10*3/uL (ref 150.0–400.0)
RBC: 3.92 Mil/uL — ABNORMAL LOW (ref 4.22–5.81)
RDW: 14.3 % (ref 11.5–15.5)
WBC: 5.4 10*3/uL (ref 4.0–10.5)

## 2021-07-30 LAB — IBC + FERRITIN
Ferritin: 202.8 ng/mL (ref 22.0–322.0)
Iron: 98 ug/dL (ref 42–165)
Saturation Ratios: 36.5 % (ref 20.0–50.0)
TIBC: 268.8 ug/dL (ref 250.0–450.0)
Transferrin: 192 mg/dL — ABNORMAL LOW (ref 212.0–360.0)

## 2021-07-30 MED ORDER — SUCRALFATE 1 G PO TABS: 1.0000 g | ORAL_TABLET | Freq: Three times a day (TID) | ORAL | 1 refills | Status: DC

## 2021-07-30 NOTE — Progress Notes (Signed)
Patient ID: Shawn Lam, male    DOB: [birth year], 71 y.o.   MRN: [id number]  This visit was conducted in person.  BP 128/72 (BP Location: Left Arm, Patient Position: Sitting, Cuff Size: Large)   Pulse 77   Temp 97.7 F (36.5 C) (Oral)   Ht '6\' 1"'$  (1.854 m)   Wt 170 lb 6.4 oz (77.3 kg)   SpO2 99%   BMI 22.48 kg/m    CC: Chief Complaint  Patient presents with   stomach issues    Subjective:   HPI: Shawn Lam is a 71 y.o. male  with history of  GAD, hypothyroid, iron def anemia, orthostatic hypotension history of duodenal ulcer and PVCs presenting on 07/30/2021 for stomach issues  Stomach issue.. he feels bloating after eating.  Recent OV  for foreign body sensation I throat... felt due to reflux... recommended trigger avoidance.  Stool always dark, no blood in stool.. does take iron. He was already on pantoprazole 40 mg .. recommended referral back to GI... he never saw them as it went away with time. Cannot see Dr. Allen Norris until June  No abdominal pain.  2. Syncopal events   He reports he went  from sitting to standing.. feels lightheaded, while talking, passed out. Comes back after 2-3 minutes he is back to normal. BP Readings from Last 3 Encounters:  07/30/21 128/72  06/10/21 106/70  04/27/21 110/60   He has been keeping up with water intake, no skipping meals.  No proceeding chest pain, some heart racing.  He is midodrine 10 mg daily three times daily  He just had cardiology OV in 04/2021   No new medications. No bleeding. No panic attack spells.  Does have upcoming surgery .[name] TURP for BPH.        Relevant past medical, surgical, family and social history reviewed and updated as indicated. Interim medical history since our last visit reviewed. Allergies and medications reviewed and updated. Outpatient Medications Prior to Visit  Medication Sig Dispense Refill   atorvastatin (LIPITOR) 80 MG tablet Take 1 tablet (80 mg total) by mouth daily. 90 tablet 3    busPIRone (BUSPAR) 10 MG tablet TAKE 2 TABLETS BY MOUTH 3 TIMES DAILY. 540 tablet 1   clopidogrel (PLAVIX) 75 MG tablet TAKE 1 TABLET DAILY 90 tablet 1   denosumab (PROLIA) 60 MG/ML SOSY injection Inject 60 mg into the skin every 6 (six) months. Do not send RX, order this from the office     ferrous sulfate 325 (65 FE) MG tablet TAKE 1 TABLET BY MOUTH 3 TIMES DAILY WITH MEALS. 270 tablet 3   finasteride (PROSCAR) 5 MG tablet Take 5 mg by mouth daily.     FLUoxetine (PROZAC) 20 MG capsule TAKE 1 CAPSULE DAILY. TAKE WITH '40MG'$  TO EQUAL '60MG'$     ONCE DAILY 90 capsule 1   FLUoxetine (PROZAC) 40 MG capsule TAKE 1 CAPSULE EVERY       MORNING 90 capsule 1   furosemide (LASIX) 20 MG tablet Take 1 tablet (20 mg total) by mouth as needed. For swelling and shortness of breath, take "sparingly" 90 tablet 0   gabapentin (NEURONTIN) 100 MG capsule TAKE TWO CAPSULES BY MOUTH TWICE A DAY 120 capsule 1   levothyroxine (SYNTHROID) 75 MCG tablet TAKE 1 TABLET BY MOUTH EVERY DAY BEFORE BREAKFAST 90 tablet 3   midodrine (PROAMATINE) 10 MG tablet Take 1 tablet (10 mg total) by mouth 3 (three) times daily. 270 tablet 3  mirtazapine (REMERON) 15 MG tablet TAKE 1 TABLET BY MOUTH EVERYDAY AT BEDTIME 90 tablet 1   nitroGLYCERIN (NITROSTAT) 0.4 MG SL tablet Place 1 tablet (0.4 mg total) under the tongue every 5 (five) minutes as needed for chest pain. 10 tablet 1   pantoprazole (PROTONIX) 40 MG tablet Take 1 tablet (40 mg total) by mouth daily. 30 tablet 3   tamsulosin (FLOMAX) 0.4 MG CAPS capsule TAKE 2 CAPSULES BY MOUTH EVERY DAY 180 capsule 0   triamcinolone cream (KENALOG) 0.1 % APPLY 1 APPLICATION TOPICALLY 2 (TWO) TIMES DAILY. AVOID FACE, GROIN, UNDERARMS. 454 g 0   zolpidem (AMBIEN) 10 MG tablet TAKE ONE TABLET BY MOUTH EVERY NIGHT AT BEDTIME AS NEEDED 30 tablet 0   No facility-administered medications prior to visit.     Per HPI unless specifically indicated in ROS section below Review of Systems Objective:  BP  128/72 (BP Location: Left Arm, Patient Position: Sitting, Cuff Size: Large)   Pulse 77   Temp 97.7 F (36.5 C) (Oral)   Ht '6\' 1"'$  (1.854 m)   Wt 170 lb 6.4 oz (77.3 kg)   SpO2 99%   BMI 22.48 kg/m   Wt Readings from Last 3 Encounters:  07/30/21 170 lb 6.4 oz (77.3 kg)  06/10/21 166 lb 9.6 oz (75.6 kg)  04/27/21 170 lb (77.1 kg)      Physical Exam Constitutional:      Appearance: He is well-developed.  HENT:     Head: Normocephalic.     Right Ear: Hearing normal.     Left Ear: Hearing normal.     Nose: Nose normal.  Neck:     Thyroid: No thyroid mass or thyromegaly.     Vascular: No carotid bruit.     Trachea: Trachea normal.  Cardiovascular:     Rate and Rhythm: Normal rate and regular rhythm.     Pulses: Normal pulses.     Heart sounds: Heart sounds not distant. No murmur heard.   No friction rub. No gallop.     Comments: No peripheral edema Pulmonary:     Effort: Pulmonary effort is normal. No respiratory distress.     Breath sounds: Normal breath sounds.  Genitourinary:    Rectum: Normal. Guaiac result negative. No mass, tenderness, external hemorrhoid or internal hemorrhoid. Normal anal tone.  Skin:    General: Skin is warm and dry.     Findings: No rash.  Psychiatric:        Speech: Speech normal.        Behavior: Behavior normal.        Thought Content: Thought content normal.      Results for orders placed or performed in visit on 69/67/89  Basic metabolic panel  Result Value Ref Range   Sodium 142 135 - 145 mEq/L   Potassium 4.6 3.5 - 5.1 mEq/L   Chloride 107 96 - 112 mEq/L   CO2 28 19 - 32 mEq/L   Glucose, Bld 98 70 - 99 mg/dL   BUN 18 6 - 23 mg/dL   Creatinine, Ser 1.74 (H) 0.40 - 1.50 mg/dL   GFR 39.09 (L) >60.00 mL/min   Calcium 9.3 8.4 - 10.5 mg/dL    This visit occurred during the SARS-CoV-2 public health emergency.  Safety protocols were in place, including screening questions prior to the visit, additional usage of staff PPE, and extensive  cleaning of exam room while observing appropriate contact time as indicated for disinfecting solutions.   COVID 19 screen:  No recent travel or known exposure to [place] The patient denies respiratory symptoms of COVID 19 at this time. The importance of social distancing was discussed today.   Assessment and Plan Problem List Items Addressed This Visit     Generalized anxiety disorder (Chronic)   Iron deficiency anemia (Chronic)   Relevant Orders   IBC + Ferritin (Completed)   Foreign body sensation in throat    Acute, likely due to reflux poorly controlled.  Increase pantoprazole to  to 2 times daily. Start Carafate 4 times daily as needed... stop if it does not help.      History of duodenal ulcer    Some of his abdominal symptoms are concerning for recurrence of duodenal ulcer.  He is on a PPI.  I have suggested a trial of Carafate and to return back to GI.  Of note stool was tested today for blood with Hemoccult and it was negative.       Orthostatic hypotension    This is most likely cause of his symptoms.  He is currently on midodrine 10 mg 3 times daily.       Syncope - Primary    Chronic, description is most consistent with orthostatic hypotension which she has been troubled with in the past.  He is currently on midodrine 10 mg 3 times daily.  I will evaluate with labs to make sure there is no new anemia electrolyte issues or other causes for syncopal event.  EKG in the office today was unremarkable.       Relevant Orders   Comprehensive metabolic panel (Completed)   CBC with Differential/Platelet (Completed)   EKG 12-Lead (Completed)       [name], MD

## 2021-07-30 NOTE — Telephone Encounter (Signed)
Called and spoke with patient.  ? ?Patient reports that he has a known history of syncope and hypotension, for which Dr. Rockey Situ has prescribed him midodrine 10 mg TID.  ? ?Reports that his syncope has gotten worse over the last month or so, and sometimes he has passed out multiple times a day, and several times he has passed out in a standing position and hit the floor. Reports that his blood pressure runs low.  ? ?Was seen by his PCP today, who told him that his midodrine may need to be increased, but she didn't feel comfortable doing it and told him to call his cardiologist's office. BP in PCPs office was 128/72.  ? ?Offered pt an appt on Monday April 24th at 10:05 with Christell Faith, PA-C. Pt accepted.  ? ?Advised pt that he should remain well hydrated, change positions slowly, be careful not to lock his knees if standing in one place, and lay down if he becomes lightheaded or dizzy.  ? ?Pt given strict ER precautions.  ? ?Pt verbalized understanding of everything dicussed and voiced appreciation for the call.  ?

## 2021-07-30 NOTE — Patient Instructions (Addendum)
Increase pantoprazole to  to 2 times daily. ? Start Carafate 4 times daily as needed... stop if it does not help. ? Please stop at the lab to have labs drawn. ? If the labs are normal we will change the fluoxetine to Cymbalta for better anxiety control. ? ?

## 2021-07-30 NOTE — Progress Notes (Signed)
Patient states that he has been having bloating issues in last couple weeks only after he eats .Marland KitchenMarland Kitchenpatient stated that in last couple weeks he has fainted 2 times for no apparent reason

## 2021-07-30 NOTE — Telephone Encounter (Signed)
Pt c/o Syncope: STAT if syncope occurred within 30 minutes and pt complains of lightheadedness ?High Priority if episode of passing out, completely, today or in last 24 hours  ? ?Did you pass out today?  ?No   ? ?When is the last time you passed out?  ?2 days ago   ? ?Has this occurred multiple times?  ?Yes  ? ?Did you have any symptoms prior to passing out?  ?Dizziness when standing, prior to fainting. ?Patient states his BP is usually low, around 80's/50's, but he has not taken a reading in a while ?

## 2021-07-31 NOTE — Progress Notes (Signed)
? ?Cardiology Office Note   ? ?Date:  08/02/2021  ? ?ID:  Shawn Lam, DOB 30-Mar-1951, MRN [id number] ? ?PCP:  Jinny Sanders, MD  ?Cardiologist:  Ida Rogue, MD  ?Electrophysiologist:  None  ? ?Chief Complaint: Syncope ? ?History of Present Illness:  ? ?Shawn Lam is a 71 y.o. male with history of CAD status post PCI to the LAD and RCA, recurrent syncope secondary felt to be secondary to orthostatic hypotension, HLD, mildly dilated aortic root, CKD stage III, iron deficiency anemia, hypothyroidism, OSA status post Inspire device, and lymphedema who presents for evaluation of recurrent syncope in the setting of orthostatic hypotension. ? ?Cardiac MRI in 10/2013 demonstrated normal LVEF and RVEF with no evidence of LGE. ? ?He underwent loop recorder implantation in 05/2016 with device interrogations showing no evidence of significant arrhythmia, prolonged pauses, or high-grade AV block. ? ?Lexiscan MPI in 11/2018 showed no evidence of ischemia and was overall low risk with an EF of 55 to 65%.  Most recent echo from 11/2018 demonstrated an EF of 60 to 07%, diastolic dysfunction, no regional wall motion abnormalities, normal RV systolic function and ventricular cavity size, normal PASP, aorta appeared to be normal, and no significant valvular abnormalities. ? ?He was most recently seen in the office in 04/2021 and noted a syncopal episode around Christmas time in 2022 associated with standing.  He also reported some palpitations when laying down at night.  He remained on midodrine 10 mg 3 times daily. ? ?He is scheduled to undergo TURP on 08/25/2021. ? ?He contacted our office on 07/30/2021 noting recurrent syncope 2 days prior when transitioning from sitting to standing.  He reported home BP readings around 80s over 50s, though had not taken his BP recently.  He had been evaluated by his PCP that day with a BP of 128/72. ? ?He comes in doing reasonably well from a cardiac perspective.  He continues to note a  history of positional dizziness and syncope.  No further syncopal episodes.  At times, he has also noted palpitations with Kardia mobile device reviewed showing sinus rhythm with occasional isolated PVCs and artifact.  He does not currently wear compression socks.  He does stay well-hydrated.  He is using as needed furosemide sparingly.  He is without symptoms of angina or decompensation. ? ? ?Labs independently reviewed: ?07/2021 - Hgb 12.9, PLT 162, potassium 4.5, BUN 14, serum creatinine 1.6, albumin 4.0, AST 41, ALT normal ?03/2021 - TC 128, TG 46, HDL 73, LDL 45, TSH normal ? ?Past Medical History:  ?Diagnosis Date  ? Actinic keratosis 02/04/2021  ? right lateral neck  ? Anemia   ? Anxiety   ? Asymptomatic LV dysfunction   ? 50-55% by echo 06/2016  ? Basal cell carcinoma 08/05/2020  ? left neck infa auricular, EDC 09/17/20  ? Berger's disease   ? CAD (coronary artery disease) 06/2005  ? PCI of LAD and RCA  ? Depression   ? Dilated aortic root ([place])   ? 4cm at sinus of Valsalva  ? Hematuria   ? Hyperlipidemia   ? Hypothyroidism   ? Insomnia   ? Left ureteral stone   ? Orthostatic hypotension   ? negative tilt table  ? OSA (obstructive sleep apnea)   ? moderate with AHI 17/hr, uses CPAP nightly(01/15/19 - has Inspire implant)  ? PVC's (premature ventricular contractions) 07/07/2016  ? Renal disorder   ? [place]- NEPHROLOGIST  ? Rosacea   ?  Urticaria   ? ? ?Past Surgical History:  ?Procedure Laterality Date  ? CARDIAC CATHETERIZATION  09/06/2013  ? [place]  ? CARDIAC CATHETERIZATION    ? COLONOSCOPY WITH PROPOFOL N/A 01/21/2019  ? Procedure: COLONOSCOPY WITH PROPOFOL;  Surgeon: Lucilla Lame, MD;  Location: [facility];  Service: Endoscopy;  Laterality: N/A;  sleep apnea  ? CORONARY ANGIOPLASTY  2004  ? STENT PLACEMENT  ? CYSTOSCOPY WITH RETROGRADE PYELOGRAM, URETEROSCOPY AND STENT PLACEMENT Left 03/19/2014  ? Procedure: CYSTOSCOPY WITH RETROGRADE PYELOGRAM, URETEROSCOPY AND STENT PLACEMENT;   Surgeon: Junious Dresser, MD;  Location: [facility];  Service: Urology;  Laterality: Left;  ? CYSTOSCOPY WITH RETROGRADE PYELOGRAM, URETEROSCOPY AND STENT PLACEMENT Bilateral 05/20/2015  ? Procedure:  CYSTOSCOPY WITH BILATERAL RETROGRADE PYELOGRAM,RIGHT  DIAGNOSTIC URETEROSCOPY ,LEFT URETEROSCOPY WITH HOLMIUM LASER  AND BILATERAL  STENT PLACEMENT ;  Surgeon: Alexis Frock, MD;  Location: [facility];  Service: Urology;  Laterality: Bilateral;  ? DRUG INDUCED ENDOSCOPY N/A 10/20/2017  ? Procedure: DRUG INDUCED SLEEP ENDOSCOPY;  Surgeon: Melida Quitter, MD;  Location: [facility];  Service: ENT;  Laterality: N/A;  ? EP IMPLANTABLE DEVICE N/A 05/13/2016  ? Procedure: Loop Recorder Insertion;  Surgeon: Deboraha Sprang, MD;  Location: [facility];  Service: Cardiovascular;  Laterality: N/A;  ? ESOPHAGOGASTRODUODENOSCOPY (EGD) WITH PROPOFOL N/A 11/30/2018  ? Procedure: ESOPHAGOGASTRODUODENOSCOPY (EGD) WITH PROPOFOL;  Surgeon: Jonathon Bellows, MD;  Location: [facility] ENDOSCOPY;  Service: Gastroenterology;  Laterality: N/A;  ? ESOPHAGOGASTRODUODENOSCOPY (EGD) WITH PROPOFOL N/A 12/01/2018  ? Procedure: ESOPHAGOGASTRODUODENOSCOPY (EGD) WITH PROPOFOL;  Surgeon: Lucilla Lame, MD;  Location: [facility] ENDOSCOPY;  Service: Endoscopy;  Laterality: N/A;  ? HOLMIUM LASER APPLICATION Bilateral 04/16/1094  ? Procedure: HOLMIUM LASER APPLICATION;  Surgeon: Alexis Frock, MD;  Location: [facility];  Service: Urology;  Laterality: Bilateral;  ? IMPLIMENTATION OF A HYPOGLOSSAL NERVE STIMULATOR  12/08/2017  ? OSA   ? LEFT HEART CATHETERIZATION WITH CORONARY ANGIOGRAM N/A 09/06/2013  ? Procedure: LEFT HEART CATHETERIZATION WITH CORONARY ANGIOGRAM;  Surgeon: Sinclair Grooms, MD;  Location: [facility] CATH LAB;  Service: Cardiovascular;  Laterality: N/A;  ? LEFT KNEE CAP  SURGERY ABOUT 40 YRS AGO  ? 1970    ? STONE EXTRACTION WITH BASKET Left 03/19/2014  ? Procedure: STONE EXTRACTION WITH BASKET;  Surgeon: Junious Dresser, MD;  Location: [facility];  Service:  Urology;  Laterality: Left;  ? ? ?Current Medications: ?Current Meds  ?Medication Sig  ? atorvastatin (LIPITOR) 80 MG tablet Take 1 tablet (80 mg total) by mouth daily.  ? busPIRone (BUSPAR) 10 MG tablet TAKE 2 TABLETS BY MOUTH 3 TIMES DAILY.  ? clopidogrel (PLAVIX) 75 MG tablet TAKE 1 TABLET DAILY  ? denosumab (PROLIA) 60 MG/ML SOSY injection Inject 60 mg into the skin every 6 (six) months. Do not send RX, order this from the office  ? ferrous sulfate 325 (65 FE) MG tablet TAKE 1 TABLET BY MOUTH 3 TIMES DAILY WITH MEALS.  ? finasteride (PROSCAR) 5 MG tablet Take 5 mg by mouth daily.  ? fludrocortisone (FLORINEF) 0.1 MG tablet Take 1 tablet (0.1 mg total) by mouth daily.  ? FLUoxetine (PROZAC) 20 MG capsule TAKE 1 CAPSULE DAILY. TAKE WITH '40MG'$  TO EQUAL '60MG'$     ONCE DAILY  ? FLUoxetine (PROZAC) 40 MG capsule TAKE 1 CAPSULE EVERY       MORNING  ? furosemide (LASIX) 20 MG tablet Take 1 tablet (20 mg total) by mouth as needed. For swelling and shortness of  breath, take "sparingly"  ? gabapentin (NEURONTIN) 100 MG capsule TAKE TWO CAPSULES BY MOUTH TWICE A DAY  ? levothyroxine (SYNTHROID) 75 MCG tablet TAKE 1 TABLET BY MOUTH EVERY DAY BEFORE BREAKFAST  ? midodrine (PROAMATINE) 10 MG tablet Take 1 tablet (10 mg total) by mouth 3 (three) times daily.  ? mirtazapine (REMERON) 15 MG tablet TAKE 1 TABLET BY MOUTH EVERYDAY AT BEDTIME  ? nitroGLYCERIN (NITROSTAT) 0.4 MG SL tablet Place 1 tablet (0.4 mg total) under the tongue every 5 (five) minutes as needed for chest pain.  ? pantoprazole (PROTONIX) 40 MG tablet Take 1 tablet (40 mg total) by mouth daily.  ? sucralfate (CARAFATE) 1 g tablet Take 1 tablet (1 g total) by mouth 4 (four) times daily -  with meals and at bedtime.  ? tamsulosin (FLOMAX) 0.4 MG CAPS capsule TAKE 2 CAPSULES BY MOUTH EVERY DAY  ? triamcinolone cream (KENALOG) 0.1 % APPLY 1 APPLICATION TOPICALLY 2 (TWO) TIMES DAILY. AVOID FACE, GROIN, UNDERARMS.  ? zolpidem (AMBIEN) 10 MG tablet TAKE ONE TABLET BY MOUTH  EVERY NIGHT AT BEDTIME AS NEEDED  ? ? ?Allergies:   Codeine and Tape  ? ?Social History  ? ?Socioeconomic History  ? Marital status: Married  ?  Spouse name: Not on file  ? Number of children: Not on file  ? Alfred Levins

## 2021-08-02 ENCOUNTER — Ambulatory Visit (INDEPENDENT_AMBULATORY_CARE_PROVIDER_SITE_OTHER): Payer: Medicare Other | Admitting: Physician Assistant

## 2021-08-02 ENCOUNTER — Ambulatory Visit (INDEPENDENT_AMBULATORY_CARE_PROVIDER_SITE_OTHER): Payer: Medicare Other

## 2021-08-02 ENCOUNTER — Encounter: Payer: Self-pay | Admitting: Physician Assistant

## 2021-08-02 ENCOUNTER — Other Ambulatory Visit: Payer: Self-pay | Admitting: Family Medicine

## 2021-08-02 VITALS — BP 110/62 | HR 64 | Ht 72.0 in | Wt 174.6 lb

## 2021-08-02 DIAGNOSIS — G4733 Obstructive sleep apnea (adult) (pediatric): Secondary | ICD-10-CM

## 2021-08-02 DIAGNOSIS — Z01810 Encounter for preprocedural cardiovascular examination: Secondary | ICD-10-CM

## 2021-08-02 DIAGNOSIS — I493 Ventricular premature depolarization: Secondary | ICD-10-CM

## 2021-08-02 DIAGNOSIS — R55 Syncope and collapse: Secondary | ICD-10-CM

## 2021-08-02 DIAGNOSIS — I25118 Atherosclerotic heart disease of native coronary artery with other forms of angina pectoris: Secondary | ICD-10-CM

## 2021-08-02 DIAGNOSIS — I951 Orthostatic hypotension: Secondary | ICD-10-CM

## 2021-08-02 DIAGNOSIS — N183 Chronic kidney disease, stage 3 unspecified: Secondary | ICD-10-CM | POA: Diagnosis not present

## 2021-08-02 DIAGNOSIS — E785 Hyperlipidemia, unspecified: Secondary | ICD-10-CM

## 2021-08-02 DIAGNOSIS — R002 Palpitations: Secondary | ICD-10-CM

## 2021-08-02 MED ORDER — FLUDROCORTISONE ACETATE 0.1 MG PO TABS: 0.1000 mg | ORAL_TABLET | Freq: Every day | ORAL | 3 refills | Status: DC

## 2021-08-02 NOTE — Patient Instructions (Signed)
Medication Instructions:  ?Your physician has recommended you make the following change in your medication:  ? ?START Fludrocortisone (Florinef) 0.1 mg once a day ? ?*If you need a refill on your cardiac medications before your next appointment, please call your pharmacy* ? ? ?Lab Work: ?None ? ?If you have labs (blood work) drawn today and your tests are completely normal, you will receive your results only by: ?MyChart Message (if you have MyChart) OR ?A paper copy in the mail ?If you have any lab test that is abnormal or we need to change your treatment, we will call you to review the results. ? ? ?Testing/Procedures: ?Your physician has requested that you have an echocardiogram. Echocardiography is a painless test that uses sound waves to create images of your heart. It provides your doctor with information about the size and shape of your heart and how well your heart?s chambers and valves are working. This procedure takes approximately one hour. There are no restrictions for this procedure. ? ?Your provider has ordered a heart monitor to wear for 14 days. This will be mailed to your home with instructions on placement. Once you have finished the time frame requested, you will return monitor in box provided. ? ? ? ? ? ?Follow-Up: ?At [facility], you and your health needs are our priority.  As part of our continuing mission to provide you with exceptional heart care, we have created designated Provider Care Teams.  These Care Teams include your primary Cardiologist (physician) and Advanced Practice Providers (APPs -  Physician Assistants and Nurse Practitioners) who all work together to provide you with the care you need, when you need it. ? ? ?Your next appointment:   ?1 month(s) ? ?The format for your next appointment:   ?In Person ? ?Provider:   ?Ida Rogue, MD or Christell Faith, PA-C  ? ? ? ? ?Important Information About Sugar ? ? ? ? ?  ?

## 2021-08-02 NOTE — Telephone Encounter (Signed)
Refill request for ZOLPIDEM TARTRATE 10 MG TABLET ? ?LOV - 07/30/21 ?Next OV - 03/29/22 ?Last refill - 07/02/21 #30/0 ? ?

## 2021-08-03 ENCOUNTER — Ambulatory Visit (INDEPENDENT_AMBULATORY_CARE_PROVIDER_SITE_OTHER): Payer: Medicare Other | Admitting: Physician Assistant

## 2021-08-03 DIAGNOSIS — Z01810 Encounter for preprocedural cardiovascular examination: Secondary | ICD-10-CM

## 2021-08-03 NOTE — Telephone Encounter (Signed)
Patient advised.

## 2021-08-03 NOTE — Progress Notes (Signed)
Patient was already cleared for procedure yesterday. No need for visit today. Discussed with the patient. Will send office visit note.  ?

## 2021-08-03 NOTE — Telephone Encounter (Signed)
Patient called to check on this refill. Patient states he will take his last pill this evening.  ?

## 2021-08-05 DIAGNOSIS — R002 Palpitations: Secondary | ICD-10-CM | POA: Diagnosis not present

## 2021-08-05 DIAGNOSIS — I25118 Atherosclerotic heart disease of native coronary artery with other forms of angina pectoris: Secondary | ICD-10-CM

## 2021-08-05 DIAGNOSIS — I493 Ventricular premature depolarization: Secondary | ICD-10-CM

## 2021-08-05 DIAGNOSIS — R55 Syncope and collapse: Secondary | ICD-10-CM

## 2021-08-06 ENCOUNTER — Telehealth: Payer: Self-pay | Admitting: Physician Assistant

## 2021-08-06 NOTE — Telephone Encounter (Signed)
Patient states he got two monitors . He put one on then got a second monitor the following day. He is not sure what to do. Please advise  ?

## 2021-08-06 NOTE — Telephone Encounter (Signed)
Spoke with patient and advised to just mail back second monitor. He verbalized understanding with no further questions at this time.  ?

## 2021-08-11 NOTE — Progress Notes (Signed)
DUE TO COVID-19 ONLY 2  VISITOR IS ALLOWED TO COME WITH YOU AND STAY IN THE WAITING ROOM ONLY DURING PRE OP AND PROCEDURE DAY OF SURGERY.   4  VISITOR  MAY VISIT WITH YOU AFTER SURGERY IN YOUR PRIVATE ROOM DURING VISITING HOURS ONLY! ?YOU MAY HAVE ONE PERSON SPEND THE NITE WITH YOU IN YOUR ROOM AFTER SURGERY.   ? ? Your procedure is scheduled on:  ?        08/25/2021.  ? Report to [facility] Main  Entrance ? ? Report to admitting at      [place]          AM ?DO NOT [place], PICTURE ID OR WALLET DAY OF SURGERY.  ?  ? ? Call this number if you have problems the morning of surgery [phone]  ? ? REMEMBER: NO  SOLID FOODS , CANDY, GUM OR MINTS AFTER MIDNITE THE NITE BEFORE SURGERY .       [name] CLEAR LIQUIDS UNTIL      0800am           DAY OF SURGERY.    ? ? ?CLEAR LIQUID DIET ? ? ?Foods Allowed      ?WATER ?BLACK COFFEE ( SUGAR OK, NO MILK, CREAM OR CREAMER) REGULAR AND DECAF  ?TEA ( SUGAR OK NO MILK, CREAM, OR CREAMER) REGULAR AND DECAF  ?PLAIN JELLO ( NO RED)  ?FRUIT ICES ( NO RED, NO FRUIT PULP)  ?POPSICLES ( NO RED)  ?JUICE- APPLE, WHITE GRAPE AND WHITE CRANBERRY  ?SPORT DRINK LIKE GATORADE ( NO RED)  ?CLEAR BROTH ( VEGETABLE , CHICKEN OR BEEF)                                                               ? ?    ? ?BRUSH YOUR TEETH MORNING OF SURGERY AND RINSE YOUR MOUTH OUT, NO CHEWING GUM CANDY OR MINTS. ?  ? ? Take these medicines the morning of surgery with A SIP OF WATER:  buspar, proscar, rpozac, gabapentin, synthroid, midodrine, protonix, flomax  ? ? ?DO NOT TAKE ANY DIABETIC MEDICATIONS DAY OF YOUR SURGERY ?                  ?            You may not have any metal on your body including hair pins and  ?            piercings  Do not wear jewelry, make-up, lotions, powders or perfumes, deodorant ?            Do not wear nail polish on your fingernails.   ?           IF YOU ARE A MALE AND WANT TO SHAVE UNDER ARMS OR LEGS PRIOR TO SURGERY YOU MUST DO SO AT LEAST 48 HOURS PRIOR TO SURGERY.  ?             Men may shave face and neck. ? ? Do not bring valuables to the hospital. [facility] NOT ?            RESPONSIBLE   FOR VALUABLES. ? Contacts, dentures or bridgework may not be worn into surgery. ? Leave suitcase in the car. After  surgery it may be brought to your room. ? ?  ? Patients discharged the day of surgery will not be allowed to drive home. IF YOU ARE HAVING SURGERY AND GOING HOME THE SAME DAY, YOU MUST HAVE AN ADULT TO DRIVE YOU HOME AND BE WITH YOU FOR 24 HOURS. YOU MAY GO HOME BY TAXI OR UBER OR ORTHERWISE, BUT AN ADULT MUST ACCOMPANY YOU HOME AND STAY WITH YOU FOR 24 HOURS. ?  ? ?            Please read over the following fact sheets you were given: ?_____________________________________________________________________ ? ?[facility] - Preparing for Surgery ?Before surgery, you can play an important role.  Because skin is not sterile, your skin needs to be as free of germs as possible.  You can reduce the number of germs on your skin by washing with CHG (chlorahexidine gluconate) soap before surgery.  CHG is an antiseptic cleaner which kills germs and bonds with the skin to continue killing germs even after washing. ?Please DO NOT use if you have an allergy to CHG or antibacterial soaps.  If your skin becomes reddened/irritated stop using the CHG and inform your nurse when you arrive at Short Stay. ?Do not shave (including legs and underarms) for at least 48 hours prior to the first CHG shower.  You may shave your face/neck. ?Please follow these instructions carefully: ? 1.  Shower with CHG Soap the night before surgery and the  morning of Surgery. ? 2.  If you choose to wash your hair, wash your hair first as usual with your  normal  shampoo. ? 3.  After you shampoo, rinse your hair and body thoroughly to remove the  shampoo.                           4.  Use CHG as you would any other liquid soap.  You can apply chg directly  to the skin and wash  ?                     Gently with a  scrungie or clean washcloth. ? 5.  Apply the CHG Soap to your body ONLY FROM THE NECK DOWN.   Do not use on face/ open      ?                     Wound or open sores. Avoid contact with eyes, ears mouth and genitals (private parts).  ?                     Production manager,  Genitals (private parts) with your normal soap. ?            6.  Wash thoroughly, paying special attention to the area where your surgery  will be performed. ? 7.  Thoroughly rinse your body with warm water from the neck down. ? 8.  DO NOT shower/wash with your normal soap after using and rinsing off  the CHG Soap. ?               9.  Pat yourself dry with a clean towel. ?           10.  Wear clean pajamas. ?           11.  Place clean sheets on your bed the night of your first shower and do not  sleep  with pets. ?Day of Surgery : ?Do not apply any lotions/deodorants the morning of surgery.  Please wear clean clothes to the hospital/surgery center. ? ?FAILURE TO FOLLOW THESE INSTRUCTIONS MAY RESULT IN THE CANCELLATION OF YOUR SURGERY ?PATIENT SIGNATURE_________________________________ ? ?NURSE SIGNATURE__________________________________ ? ?________________________________________________________________________  ? ? ?           ?

## 2021-08-11 NOTE — Progress Notes (Addendum)
Anesthesia Review: ? ?PCP: [name] LOV 07/30/21  ?Cardiologist : Ida Rogue LOV 08/03/21- preop exam by Leanor Kail , [place]  ?08/02/21- appt with Laurine Blazer for syncope  ?Chest x-ray : ?EKG : 07/30/21  ?08/02/21- monitor in rpogress pt states he will removed on 08/18/2021.   ?Echo : Scheduled for 08/19/21  at 100pm  ?Stress test: ?Cardiac Cath :  ?Activity level: can do a flight of stairs without difficulty  ?Sleep Study/ CPAP : uses Inspire for sleep apnea  ?PT uses oxygen 4L at nite per pt.   ?Fasting Blood Sugar :      / Checks Blood Sugar -- times a day:   ?Blood Thinner/ Instructions /Last Dose: ?ASA / Instructions/ Last Dose :   ?07/30/21- cbc and cmp  ?Plavix- stop 7 days prior per pt  ?Has a loop recorder still implanted but not being  monitored ?BMP done 08/16/2021 routed to DR [facility].  ?

## 2021-08-12 ENCOUNTER — Encounter: Payer: Self-pay | Admitting: Dermatology

## 2021-08-12 ENCOUNTER — Ambulatory Visit (INDEPENDENT_AMBULATORY_CARE_PROVIDER_SITE_OTHER): Payer: Medicare Other | Admitting: Dermatology

## 2021-08-12 DIAGNOSIS — L821 Other seborrheic keratosis: Secondary | ICD-10-CM

## 2021-08-12 DIAGNOSIS — L578 Other skin changes due to chronic exposure to nonionizing radiation: Secondary | ICD-10-CM | POA: Diagnosis not present

## 2021-08-12 DIAGNOSIS — I25118 Atherosclerotic heart disease of native coronary artery with other forms of angina pectoris: Secondary | ICD-10-CM

## 2021-08-12 DIAGNOSIS — L570 Actinic keratosis: Secondary | ICD-10-CM | POA: Diagnosis not present

## 2021-08-12 DIAGNOSIS — L814 Other melanin hyperpigmentation: Secondary | ICD-10-CM | POA: Diagnosis not present

## 2021-08-12 DIAGNOSIS — L820 Inflamed seborrheic keratosis: Secondary | ICD-10-CM

## 2021-08-12 NOTE — Progress Notes (Signed)
Follow-Up Visit   Subjective  Shawn Lam is a 71 y.o. male who presents for the following: Actinic Keratosis (6 month recheck. Face, neck. Bx proven on right neck. S/P 5FU calcipotriene treatment to forehead/temples since last visit). The patient has spots, moles and lesions to be evaluated, some may be new or changing and the patient has concerns that these could be cancer.  The following portions of the chart were reviewed this encounter and updated as appropriate:  Tobacco  Allergies  Meds  Problems  Med Hx  Surg Hx  Fam Hx     Review of Systems: No other skin or systemic complaints except as noted in HPI or Assessment and Plan.  Objective  Well appearing patient in no apparent distress; mood and affect are within normal limits.  A focused examination was performed including face, neck, hands. Relevant physical exam findings are noted in the Assessment and Plan.  left forehead x2 (2) Erythematous thin papules/macules with gritty scale.   Scalp x3 (3) Erythematous keratotic or waxy stuck-on papule or plaque.   Assessment & Plan   Seborrheic Keratoses - Stuck-on, waxy, tan-brown papules and/or plaques  - Benign-appearing - Discussed benign etiology and prognosis. - Observe - Call for any changes  Lentigines - Scattered tan macules - Due to sun exposure - Benign-appering, observe - Recommend daily broad spectrum sunscreen SPF 30+ to sun-exposed areas, reapply every 2 hours as needed. - Call for any changes  Actinic Damage - chronic, secondary to cumulative UV radiation exposure/sun exposure over time - diffuse scaly erythematous macules with underlying dyspigmentation - Recommend daily broad spectrum sunscreen SPF 30+ to sun-exposed areas, reapply every 2 hours as needed.  - Recommend staying in the shade or wearing long sleeves, sun glasses (UVA+UVB protection) and wide brim hats (4-inch brim around the entire circumference of the hat). - Call for new or  changing lesions.  AK (actinic keratosis) (2) left forehead x2  Actinic keratoses are precancerous spots that appear secondary to cumulative UV radiation exposure/sun exposure over time. They are chronic with expected duration over 1 year. A portion of actinic keratoses will progress to squamous cell carcinoma of the skin. It is not possible to reliably predict which spots will progress to skin cancer and so treatment is recommended to prevent development of skin cancer.  Recommend daily broad spectrum sunscreen SPF 30+ to sun-exposed areas, reapply every 2 hours as needed.  Recommend staying in the shade or wearing long sleeves, sun glasses (UVA+UVB protection) and wide brim hats (4-inch brim around the entire circumference of the hat). Call for new or changing lesions.  Destruction of lesion - left forehead x2 Complexity: simple   Destruction method: cryotherapy   Informed consent: discussed and consent obtained   Timeout:  patient name, date of birth, surgical site, and procedure verified Lesion destroyed using liquid nitrogen: Yes   Region frozen until ice ball extended beyond lesion: Yes   Outcome: patient tolerated procedure well with no complications   Post-procedure details: wound care instructions given    Inflamed seborrheic keratosis (3) Scalp x3  Destruction of lesion - Scalp x3 Complexity: simple   Destruction method: cryotherapy   Informed consent: discussed and consent obtained   Timeout:  patient name, date of birth, surgical site, and procedure verified Lesion destroyed using liquid nitrogen: Yes   Region frozen until ice ball extended beyond lesion: Yes   Outcome: patient tolerated procedure well with no complications   Post-procedure details: wound care instructions  given     Return in about 6 months (around 02/12/2022) for TBSE, [place] Follow Up. I, Emelia Salisbury, CMA, am acting as scribe for Sarina Ser, MD. Documentation: I have reviewed the above documentation  for accuracy and completeness, and I agree with the above.  Sarina Ser, MD

## 2021-08-12 NOTE — Patient Instructions (Signed)
Cryotherapy Aftercare ? ?Wash gently with soap and water everyday.   ?Apply Vaseline and Band-Aid daily until healed.  ? ?Prior to procedure, discussed risks of blister formation, small wound, skin dyspigmentation, or rare scar following cryotherapy. Recommend Vaseline ointment to treated areas while healing.  ? ?Recommend daily broad spectrum sunscreen SPF 30+ to sun-exposed areas, reapply every 2 hours as needed. Call for new or changing lesions.  ?Staying in the shade or wearing long sleeves, sun glasses (UVA+UVB protection) and wide brim hats (4-inch brim around the entire circumference of the hat) are also recommended for sun protection.  ? ? ?If You Need Anything After Your Visit ? ?If you have any questions or concerns for your doctor, please call our main line at [phone] and press option 4 to reach your doctor's medical assistant. If no one answers, please leave a voicemail as directed and we will return your call as soon as possible. Messages left after 4 pm will be answered the following business day.  ? ?You may also send [place] a message via MyChart. We typically respond to MyChart messages within 1-2 business days. ? ?For prescription refills, please ask your pharmacy to contact our office. Our fax number is [phone]. ? ?If you have an urgent issue when the clinic is closed that cannot wait until the next business day, you can page your doctor at the number below.   ? ?Please note that while we do our best to be available for urgent issues outside of office hours, we are not available 24/7.  ? ?If you have an urgent issue and are unable to reach [place], you may choose to seek medical care at your doctor's office, retail clinic, urgent care center, or emergency room. ? ?If you have a medical emergency, please immediately call 911 or go to the emergency department. ? ?Pager Numbers ? ?- Dr. Nehemiah Massed: [phone] ? ?- Dr. Laurence Ferrari: [phone] ? ?- Dr. Nicole Kindred: [phone] ? ?In the event of inclement  weather, please call our main line at [phone] for an update on the status of any delays or closures. ? ?Dermatology Medication Tips: ?Please keep the boxes that topical medications come in in order to help keep track of the instructions about where and how to use these. Pharmacies typically print the medication instructions only on the boxes and not directly on the medication tubes.  ? ?If your medication is too expensive, please contact our office at [phone] option 4 or send [place] a message through [place].  ? ?We are unable to tell what your co-pay for medications will be in advance as this is different depending on your insurance coverage. However, we may be able to find a substitute medication at lower cost or fill out paperwork to get insurance to cover a needed medication.  ? ?If a prior authorization is required to get your medication covered by your insurance company, please allow [place] 1-2 business days to complete this process. ? ?Drug prices often vary depending on where the prescription is filled and some pharmacies may offer cheaper prices. ? ?The website www.goodrx.com contains coupons for medications through different pharmacies. The prices here do not account for what the cost may be with help from insurance (it may be cheaper with your insurance), but the website can give you the price if you did not use any insurance.  ?- You can print the associated coupon and take it with your prescription to the pharmacy.  ?- You may also stop by our office during  regular business hours and pick up a GoodRx coupon card.  ?- If you need your prescription sent electronically to a different pharmacy, notify our office through [facility] or by phone at [phone] option 4. ? ? ? ? ?Si Usted Necesita Algo Despu?s de Su Visita ? ?Tambi?n puede enviarnos un mensaje a trav?s de MyChart. Por lo general respondemos a los mensajes de MyChart en el transcurso de 1 a 2 d?as h?biles. ? ?Para renovar recetas,  por favor pida a su farmacia que se ponga en contacto con nuestra oficina. Nuestro n?mero de fax es el [phone]. ? ?Si tiene un asunto urgente cuando la cl?nica est? cerrada y que no puede esperar hasta el siguiente d?a h?bil, puede llamar/localizar a su doctor(a) al n?mero que aparece a continuaci?n.  ? ?Por favor, tenga en cuenta que aunque hacemos todo lo posible para estar disponibles para asuntos urgentes fuera del horario de oficina, no estamos disponibles las 24 horas del d?a, los 7 d?as de la semana.  ? ?Si tiene un problema urgente y no puede comunicarse con nosotros, puede optar por buscar atenci?n m?dica  en el consultorio de su doctor(a), en una cl?nica privada, en un centro de atenci?n urgente o en una sala de emergencias. ? ?Si tiene Engineer, maintenance (IT) m?dica, por favor llame inmediatamente al 911 o vaya a la sala de emergencias. ? ?N?meros de b?per ? ?- Dr. Nehemiah Massed: [phone] ? ?- Dra. Moye: [phone] ? ?- Dra. Nicole Kindred: [phone] ? ?En caso de inclemencias del tiempo, por favor llame a nuestra l?nea principal al [phone] para una actualizaci?n sobre el estado de cualquier retraso o cierre. ? ?Consejos para la medicaci?n en dermatolog?a: ?Por favor, guarde las cajas en las que vienen los medicamentos de uso t?pico para ayudarle a seguir las instrucciones sobre d?nde y c?mo usarlos. Las farmacias generalmente imprimen las instrucciones del medicamento s?lo en las cajas y no directamente en los tubos del [place].  ? ?Si su medicamento es muy caro, por favor, p?ngase en contacto con Zigmund Daniel llamando al [phone] y presione la opci?n 4 o env?enos un mensaje a trav?s de MyChart.  ? ?No podemos decirle cu?l ser? su copago por los medicamentos por adelantado ya que esto es diferente dependiendo de la cobertura de su seguro. Sin embargo, es posible que podamos encontrar un medicamento sustituto a Electrical engineer un formulario para que el seguro cubra el medicamento que se  considera necesario.  ? ?Si se requiere Ardelia Mems autorizaci?n previa para que su compa??a de seguros [place] su medicamento, por favor perm?tanos de 1 a 2 d?as h?biles para completar este proceso. ? ?Los precios de los medicamentos var?an con frecuencia dependiendo del Environmental consultant de d?nde se surte la receta y alguna farmacias pueden ofrecer precios m?s baratos. ? ?El sitio web www.goodrx.com tiene cupones para medicamentos de Airline pilot. Los precios aqu? no tienen en cuenta lo que podr?a costar con la ayuda del seguro (puede ser m?s barato con su seguro), pero el sitio web puede darle el precio si no utiliz? ning?n seguro.  ?- Puede imprimir el cup?n correspondiente y llevarlo con su receta a la farmacia.  ?- Tambi?n puede pasar por nuestra oficina durante el horario de atenci?n regular y recoger una tarjeta de cupones de GoodRx.  ?- Si necesita que su receta se env?e electr?nicamente a Chiropodist, informe a nuestra oficina a trav?s de MyChart de [place] o por tel?fono llamando al [phone] y presione la opci?n 4.  ?

## 2021-08-13 DIAGNOSIS — Z20822 Contact with and (suspected) exposure to covid-19: Secondary | ICD-10-CM | POA: Diagnosis not present

## 2021-08-16 ENCOUNTER — Encounter (HOSPITAL_COMMUNITY)
Admission: RE | Admit: 2021-08-16 | Discharge: 2021-08-16 | Disposition: A | Discharge status: Home / Self Care | Payer: Medicare Other | Source: Ambulatory Visit | Attending: Urology | Admitting: Urology

## 2021-08-16 ENCOUNTER — Other Ambulatory Visit: Payer: Self-pay

## 2021-08-16 ENCOUNTER — Encounter (HOSPITAL_COMMUNITY): Payer: Self-pay

## 2021-08-16 VITALS — BP 126/73 | HR 61 | Temp 97.8°F | Resp 18 | Ht 72.0 in | Wt 172.1 lb

## 2021-08-16 DIAGNOSIS — Z01812 Encounter for preprocedural laboratory examination: Secondary | ICD-10-CM | POA: Insufficient documentation

## 2021-08-16 DIAGNOSIS — Z01818 Encounter for other preprocedural examination: Secondary | ICD-10-CM

## 2021-08-16 LAB — BASIC METABOLIC PANEL
Anion gap: 7 (ref 5–15)
BUN: 13 mg/dL (ref 8–23)
CO2: 27 mmol/L (ref 22–32)
Calcium: 8.5 mg/dL — ABNORMAL LOW (ref 8.9–10.3)
Chloride: 110 mmol/L (ref 98–111)
Creatinine, Ser: 1.49 mg/dL — ABNORMAL HIGH (ref 0.61–1.24)
GFR, Estimated: 50 mL/min — ABNORMAL LOW (ref >60)
Glucose, Bld: 86 mg/dL (ref 70–99)
Potassium: 3.3 mmol/L — ABNORMAL LOW (ref 3.5–5.1)
Sodium: 144 mmol/L (ref 135–145)

## 2021-08-16 LAB — CBC
HCT: 38.2 % — ABNORMAL LOW (ref 39.0–52.0)
Hemoglobin: 12.8 g/dL — ABNORMAL LOW (ref 13.0–17.0)
MCH: 34 pg (ref 26.0–34.0)
MCHC: 33.5 g/dL (ref 30.0–36.0)
MCV: 101.6 fL — ABNORMAL HIGH (ref 80.0–100.0)
Platelets: 143 10*3/uL — ABNORMAL LOW (ref 150–400)
RBC: 3.76 MIL/uL — ABNORMAL LOW (ref 4.22–5.81)
RDW: 13.9 % (ref 11.5–15.5)
WBC: 5.3 10*3/uL (ref 4.0–10.5)
nRBC: 0 % (ref 0.0–0.2)

## 2021-08-19 ENCOUNTER — Ambulatory Visit (INDEPENDENT_AMBULATORY_CARE_PROVIDER_SITE_OTHER): Payer: Medicare Other

## 2021-08-19 DIAGNOSIS — I25118 Atherosclerotic heart disease of native coronary artery with other forms of angina pectoris: Secondary | ICD-10-CM | POA: Diagnosis not present

## 2021-08-19 LAB — ECHOCARDIOGRAM COMPLETE
AR max vel: 3.23 cm2
AV Area VTI: 3.2 cm2
AV Area mean vel: 3.09 cm2
AV Mean grad: 2 mmHg
AV Peak grad: 3.8 mmHg
Ao pk vel: 0.97 m/s
Area-P 1/2: 3.66 cm2
S' Lateral: 4 cm
Single Plane A2C EF: 54.6 %

## 2021-08-20 DIAGNOSIS — R3912 Poor urinary stream: Secondary | ICD-10-CM | POA: Diagnosis not present

## 2021-08-20 NOTE — Progress Notes (Signed)
Anesthesia Chart Review ? ? Case: [id number] Date/Time: 08/25/21 1045  ? Procedure: TRANSURETHRAL RESECTION OF THE PROSTATE (TURP) - 1 HR  ? Anesthesia type: General  ? Pre-op diagnosis: PROSTATIC HYPERPLASIA  ? [facility]  ? Surgeons: Alexis Frock, MD  ? ?  ? ? ?DISCUSSION:71 y.o. never smoker with h/o OSA, hypothyroidism, CAD (s/p PCI to the LAD and RCA), CKD Stage III, prostatic hyperplasia scheduled for above procedure 08/25/2021 with Dr. Alexis Frock.  ? ?Pt seen by cardiology 08/02/2021. Per OV note, "Preoperative cardiac risk stratification:Scheduled to undergo TURP on 08/25/2021.  Per Revised Cardiac Risk Index, he is low risk for noncardiac surgery.  Per [facility] Activity Status Index, he can achieve > 4 METs without cardiac limitation.  Primary cardiologist has reviewed the chart and has indicated the patient can hold clopidogrel for 5 to 7 days prior to TURP with recommendation to resume as soon as safely possible.  If not precluded from a bleeding perspective, would recommend aspirin 81 mg daily be continued throughout the operative timeframe.  No further cardiac testing is indicated at this time.  He may proceed at an overall low risk." ? ?Plavix to be held 7 days prior to procedure.  ? ?Anticipate pt can proceed with planned procedure barring acute status change.   ?VS: BP 126/73   Pulse 61   Temp 36.6 ?C (Oral)   Resp 18   Ht 6' (1.829 m)   Wt 78.1 kg   SpO2 100%   BMI 23.34 kg/m?  ? ?PROVIDERS: ?Jinny Sanders, MD is PCP  ? ?Cardiologist:  Ida Rogue, MD  ? ?LABS: Labs reviewed: Acceptable for surgery. ?(all labs ordered are listed, but only abnormal results are displayed) ? ?Labs Reviewed  ?BASIC METABOLIC PANEL - Abnormal; Notable for the following components:  ?    Result Value  ? Potassium 3.3 (*)   ? Creatinine, Ser 1.49 (*)   ? Calcium 8.5 (*)   ? GFR, Estimated 50 (*)   ? All other components within normal limits  ?CBC - Abnormal; Notable for the following  components:  ? RBC 3.76 (*)   ? Hemoglobin 12.8 (*)   ? HCT 38.2 (*)   ? MCV 101.6 (*)   ? Platelets 143 (*)   ? All other components within normal limits  ? ? ? ?IMAGES: ? ? ?EKG: ?07/30/21 ?Rate 67 bpm  ?Sinus rhythm  ? ?CV: ?Echo 08/19/21 ?1. Left ventricular ejection fraction, by estimation, is 50% . The left  ?ventricle has low normal function. The left ventricle demonstrates global  ?hypokinesis.  ? 2. Right ventricular systolic function is normal. The right ventricular  ?size is normal. There is normal pulmonary artery systolic pressure. The  ?estimated right ventricular systolic pressure is 10.1 mmHg.  ? 3. The mitral valve is normal in structure. Mild mitral valve  ?regurgitation. No evidence of mitral stenosis.  ? 4. The aortic valve was not well visualized. Aortic valve regurgitation  ?is not visualized. No aortic stenosis is present.  ? 5. The inferior vena cava is dilated in size with <50% respiratory  ?variability, suggesting right atrial pressure of 15 mmHg.  ? 6. PVCs noted  ? ?Myocardial Perfusion 11/29/2018 ?There was no ST segment deviation noted during stress. ?No T wave inversion was noted during stress. ?The study is normal. ?This is a low risk study. ?The left ventricular ejection fraction is normal (55-65%). ?Past Medical History:  ?Diagnosis Date  ? Actinic keratosis  02/04/2021  ? right lateral neck  ? Anemia   ? Anxiety   ? Asymptomatic LV dysfunction   ? 50-55% by echo 06/2016  ? Berger's disease   ? CAD (coronary artery disease) 06/2005  ? PCI of LAD and RCA  ? Depression   ? Dilated aortic root ([place])   ? 4cm at sinus of Valsalva  ? Hematuria   ? Hyperlipidemia   ? Hypothyroidism   ? Insomnia   ? Left ureteral stone   ? Orthostatic hypotension   ? negative tilt table  ? OSA (obstructive sleep apnea)   ? moderate with AHI 17/hr, uses CPAP nightly(01/15/19 - has Inspire implant)  ? PVC's (premature ventricular contractions) 07/07/2016  ? Renal disorder   ? [place]- NEPHROLOGIST   ? Rosacea   ? Urticaria   ? ? ?Past Surgical History:  ?Procedure Laterality Date  ? CARDIAC CATHETERIZATION  09/06/2013  ? [place]  ? CARDIAC CATHETERIZATION    ? COLONOSCOPY WITH PROPOFOL N/A 01/21/2019  ? Procedure: COLONOSCOPY WITH PROPOFOL;  Surgeon: Lucilla Lame, MD;  Location: [place];  Service: Endoscopy;  Laterality: N/A;  sleep apnea  ? CORONARY ANGIOPLASTY  2004  ? STENT PLACEMENT  ? CYSTOSCOPY WITH RETROGRADE PYELOGRAM, URETEROSCOPY AND STENT PLACEMENT Left 03/19/2014  ? Procedure: CYSTOSCOPY WITH RETROGRADE PYELOGRAM, URETEROSCOPY AND STENT PLACEMENT;  Surgeon: Junious Dresser, MD;  [facility];  Service: Urology;  Laterality: Left;  ? CYSTOSCOPY WITH RETROGRADE PYELOGRAM, URETEROSCOPY AND STENT PLACEMENT Bilateral 05/20/2015  ? Procedure:  CYSTOSCOPY WITH BILATERAL RETROGRADE PYELOGRAM,RIGHT  DIAGNOSTIC URETEROSCOPY ,LEFT URETEROSCOPY WITH HOLMIUM LASER  AND BILATERAL  STENT PLACEMENT ;  Surgeon: Alexis Frock, MD;  [facility];  Service: Urology;  Laterality: Bilateral;  ? DRUG INDUCED ENDOSCOPY N/A 10/20/2017  ? Procedure: DRUG INDUCED SLEEP ENDOSCOPY;  Surgeon: Melida Quitter, MD;  Location: [facility];  Service: ENT;  Laterality: N/A;  ? EP IMPLANTABLE DEVICE N/A 05/13/2016  ? Procedure: Loop Recorder Insertion;  Surgeon: Deboraha Sprang, MD;  Location: [place] [facility];  Service: Cardiovascular;  Laterality: N/A;  ? ESOPHAGOGASTRODUODENOSCOPY (EGD) WITH PROPOFOL N/A 11/30/2018  ? Procedure: ESOPHAGOGASTRODUODENOSCOPY (EGD) WITH PROPOFOL;  Surgeon: Jonathon Bellows, MD;  Location: [facility] ENDOSCOPY;  Service: Gastroenterology;  Laterality: N/A;  ? ESOPHAGOGASTRODUODENOSCOPY (EGD) WITH PROPOFOL N/A 12/01/2018  ? Procedure: ESOPHAGOGASTRODUODENOSCOPY (EGD) WITH PROPOFOL;  Surgeon: Lucilla Lame, MD;  [facility];  Service: Endoscopy;  Laterality: N/A;  ? HOLMIUM LASER APPLICATION Bilateral 07/14/8183  ? Procedure: HOLMIUM LASER APPLICATION;  Surgeon: Alexis Frock,  MD;  [facility];  Service: Urology;  Laterality: Bilateral;  ? IMPLIMENTATION OF A HYPOGLOSSAL NERVE STIMULATOR  12/08/2017  ? OSA   ? LEFT HEART CATHETERIZATION WITH CORONARY ANGIOGRAM N/A 09/06/2013  ? Procedure: LEFT HEART CATHETERIZATION WITH CORONARY ANGIOGRAM;  Surgeon: Sinclair Grooms, MD;  Location: [facility] CATH LAB;  Service: Cardiovascular;  Laterality: N/A;  ? LEFT KNEE CAP  SURGERY ABOUT 40 YRS AGO  ? 1970    ? STONE EXTRACTION WITH BASKET Left 03/19/2014  ? Procedure: STONE EXTRACTION WITH BASKET;  Surgeon: Junious Dresser, MD;  [facility];  Service: Urology;  Laterality: Left;  ? ? ?MEDICATIONS: ? atorvastatin (LIPITOR) 80 MG tablet  ? busPIRone (BUSPAR) 10 MG tablet  ? clopidogrel (PLAVIX) 75 MG tablet  ? denosumab (PROLIA) 60 MG/ML SOSY injection  ? ferrous sulfate 325 (65 FE) MG tablet  ? finasteride (PROSCAR) 5 MG tablet  ?  fludrocortisone (FLORINEF) 0.1 MG tablet  ? FLUoxetine (PROZAC) 20 MG capsule  ? FLUoxetine (PROZAC) 40 MG capsule  ? furosemide (LASIX) 20 MG tablet  ? gabapentin (NEURONTIN) 100 MG capsule  ? levothyroxine (SYNTHROID) 75 MCG tablet  ? midodrine (PROAMATINE) 10 MG tablet  ? mirtazapine (REMERON) 15 MG tablet  ? Multiple Vitamins-Minerals (MULTIVITAMIN WITH MINERALS) tablet  ? nitroGLYCERIN (NITROSTAT) 0.4 MG SL tablet  ? pantoprazole (PROTONIX) 40 MG tablet  ? sucralfate (CARAFATE) 1 g tablet  ? tamsulosin (FLOMAX) 0.4 MG CAPS capsule  ? triamcinolone cream (KENALOG) 0.1 %  ? zolpidem (AMBIEN) 10 MG tablet  ? ?No current facility-administered medications for this encounter.  ? ? ? ?Konrad Felix Ward, PA-C ?[facility] ?[phone] ? ? ? ? ? ? ?

## 2021-08-20 NOTE — Anesthesia Preprocedure Evaluation (Addendum)
Anesthesia Evaluation  ?Patient identified by MRN, date of birth, ID band ?Patient awake ? ? ? ?Reviewed: ?Allergy & Precautions, NPO status , Patient's Chart, lab work & pertinent test results ? ?Airway ?Mallampati: I ? ?TM Distance: >3 FB ?Neck ROM: Full ? ? ? Dental ?no notable dental hx. ?(+) Teeth Intact, Dental Advisory Given ?  ?Pulmonary ?sleep apnea ,  ?  ?Pulmonary exam normal ?breath sounds clear to auscultation ? ? ? ? ? ? Cardiovascular ?+ CAD  ?Normal cardiovascular exam ?Rhythm:Regular Rate:Normal ? ? ?  ?Neuro/Psych ?PSYCHIATRIC DISORDERS Anxiety  Neuromuscular disease   ? GI/Hepatic ?Neg liver ROS, GERD  ,  ?Endo/Other  ?Hypothyroidism  ? Renal/GU ?Renal InsufficiencyRenal diseaseLab Results ?     Component                Value               Date                 ?     CREATININE               1.49 (H)            08/16/2021         ?     K                        3.3 (L)             08/16/2021           ?    ? ?  ?Musculoskeletal ? ?(+) Arthritis ,  ? Abdominal ?  ?Peds ? Hematology ? ?(+) Blood dyscrasia, , Lab Results ?     Component                Value               Date                 ?     WBC                      5.3                 08/16/2021           ?     HGB                      12.8 (L)            08/16/2021           ?     HCT                      38.2 (L)            08/16/2021           ?     MCV                      101.6 (H)           08/16/2021           ?     PLT                      143 (L)             08/16/2021           ?   ?  Anesthesia Other Findings ?ALL: Codeine, Tape ? Reproductive/Obstetrics ? ?  ? ? ? ? ? ? ? ? ? ? ? ? ? ?  ?  ? ? ? ? ? ? ? ?Anesthesia Physical ?Anesthesia Plan ? ?ASA: 3 ? ?Anesthesia Plan: General  ? ?Post-op Pain Management: Minimal or no pain anticipated  ? ?Induction: Intravenous ? ?PONV Risk Score and Plan: 3 and Treatment may vary due to age or medical condition, Ondansetron and Midazolam ? ?Airway Management  Planned: LMA ? ?Additional Equipment: None ? ?Intra-op Plan:  ? ?Post-operative Plan:  ? ?Informed Consent: I have reviewed the patients History and Physical, chart, labs and discussed the procedure including the risks, benefits and alternatives for the proposed anesthesia with the patient or authorized representative who has indicated his/her understanding and acceptance.  ? ? ? ?Dental advisory given ? ?Plan Discussed with:  ? ?Anesthesia Plan Comments: (See PAT note 08/16/2021)  ? ? ? ? ?Anesthesia Quick Evaluation ? ?

## 2021-08-23 ENCOUNTER — Other Ambulatory Visit: Payer: Self-pay | Admitting: Family Medicine

## 2021-08-23 NOTE — Telephone Encounter (Signed)
Last office visit 07/30/21 for Stomach issues.  Last refilled 07/30/2021 for #40 with 1 refills.  CPE scheduled for 03/29/2022. ?

## 2021-08-25 ENCOUNTER — Encounter (HOSPITAL_COMMUNITY)
Admission: RE | Disposition: A | Discharge status: Home / Self Care | Payer: Self-pay | Source: Ambulatory Visit | Attending: Urology

## 2021-08-25 ENCOUNTER — Ambulatory Visit (HOSPITAL_BASED_OUTPATIENT_CLINIC_OR_DEPARTMENT_OTHER): Payer: Medicare Other | Admitting: Anesthesiology

## 2021-08-25 ENCOUNTER — Encounter (HOSPITAL_COMMUNITY): Payer: Self-pay | Admitting: Urology

## 2021-08-25 ENCOUNTER — Ambulatory Visit (HOSPITAL_COMMUNITY): Payer: Medicare Other | Admitting: Physician Assistant

## 2021-08-25 ENCOUNTER — Ambulatory Visit (HOSPITAL_COMMUNITY)
Admission: RE | Admit: 2021-08-25 | Discharge: 2021-08-26 | Disposition: A | Discharge status: Home / Self Care | Payer: Medicare Other | Source: Ambulatory Visit | Attending: Urology | Admitting: Urology

## 2021-08-25 ENCOUNTER — Other Ambulatory Visit: Payer: Self-pay | Admitting: Cardiovascular Disease

## 2021-08-25 ENCOUNTER — Other Ambulatory Visit: Payer: Self-pay

## 2021-08-25 DIAGNOSIS — F419 Anxiety disorder, unspecified: Secondary | ICD-10-CM

## 2021-08-25 DIAGNOSIS — E039 Hypothyroidism, unspecified: Secondary | ICD-10-CM | POA: Diagnosis not present

## 2021-08-25 DIAGNOSIS — D759 Disease of blood and blood-forming organs, unspecified: Secondary | ICD-10-CM | POA: Insufficient documentation

## 2021-08-25 DIAGNOSIS — G4730 Sleep apnea, unspecified: Secondary | ICD-10-CM | POA: Insufficient documentation

## 2021-08-25 DIAGNOSIS — R55 Syncope and collapse: Secondary | ICD-10-CM | POA: Diagnosis not present

## 2021-08-25 DIAGNOSIS — R002 Palpitations: Secondary | ICD-10-CM | POA: Diagnosis not present

## 2021-08-25 DIAGNOSIS — N401 Enlarged prostate with lower urinary tract symptoms: Secondary | ICD-10-CM

## 2021-08-25 DIAGNOSIS — I25118 Atherosclerotic heart disease of native coronary artery with other forms of angina pectoris: Secondary | ICD-10-CM | POA: Diagnosis not present

## 2021-08-25 DIAGNOSIS — R3912 Poor urinary stream: Secondary | ICD-10-CM | POA: Diagnosis not present

## 2021-08-25 DIAGNOSIS — I2510 Atherosclerotic heart disease of native coronary artery without angina pectoris: Secondary | ICD-10-CM | POA: Diagnosis not present

## 2021-08-25 DIAGNOSIS — N400 Enlarged prostate without lower urinary tract symptoms: Secondary | ICD-10-CM | POA: Diagnosis present

## 2021-08-25 DIAGNOSIS — Z79899 Other long term (current) drug therapy: Secondary | ICD-10-CM | POA: Diagnosis not present

## 2021-08-25 DIAGNOSIS — Z7902 Long term (current) use of antithrombotics/antiplatelets: Secondary | ICD-10-CM | POA: Diagnosis not present

## 2021-08-25 DIAGNOSIS — N4232 Atypical small acinar proliferation of prostate: Secondary | ICD-10-CM | POA: Diagnosis not present

## 2021-08-25 DIAGNOSIS — I493 Ventricular premature depolarization: Secondary | ICD-10-CM | POA: Diagnosis not present

## 2021-08-25 HISTORY — PX: TRANSURETHRAL RESECTION OF PROSTATE: SHX73

## 2021-08-25 LAB — HEMOGLOBIN AND HEMATOCRIT, BLOOD
HCT: 37.1 % — ABNORMAL LOW (ref 39.0–52.0)
Hemoglobin: 12 g/dL — ABNORMAL LOW (ref 13.0–17.0)

## 2021-08-25 SURGERY — TURP (TRANSURETHRAL RESECTION OF PROSTATE)
Anesthesia: General | Site: Urethra

## 2021-08-25 MED ORDER — LACTATED RINGERS IV SOLN: INTRAVENOUS | Status: DC

## 2021-08-25 MED ORDER — SENNOSIDES-DOCUSATE SODIUM 8.6-50 MG PO TABS
1.0000 | ORAL_TABLET | Freq: Two times a day (BID) | ORAL | Status: DC
  Administered 2021-08-25 – 2021-08-26 (×2): 1 via ORAL
  Filled 2021-08-25 (×2): qty 1

## 2021-08-25 MED ORDER — BUSPIRONE HCL 5 MG PO TABS
10.0000 mg | ORAL_TABLET | Freq: Three times a day (TID) | ORAL | Status: DC
  Administered 2021-08-25 – 2021-08-26 (×3): 10 mg via ORAL
  Filled 2021-08-25 (×3): qty 2

## 2021-08-25 MED ORDER — HYDROMORPHONE HCL 1 MG/ML IJ SOLN
INTRAMUSCULAR | Status: AC
  Filled 2021-08-25: qty 1

## 2021-08-25 MED ORDER — ACETAMINOPHEN 10 MG/ML IV SOLN
INTRAVENOUS | Status: AC
  Filled 2021-08-25: qty 100

## 2021-08-25 MED ORDER — 0.9 % SODIUM CHLORIDE (POUR BTL) OPTIME
TOPICAL | Status: DC | PRN
  Administered 2021-08-25: 1000 mL

## 2021-08-25 MED ORDER — SODIUM CHLORIDE 0.9 % IR SOLN
Status: DC | PRN
  Administered 2021-08-25: 6000 mL
  Administered 2021-08-25: 3000 mL via INTRAVESICAL

## 2021-08-25 MED ORDER — GABAPENTIN 100 MG PO CAPS
200.0000 mg | ORAL_CAPSULE | Freq: Two times a day (BID) | ORAL | Status: DC
  Administered 2021-08-25 – 2021-08-26 (×2): 200 mg via ORAL
  Filled 2021-08-25 (×2): qty 2

## 2021-08-25 MED ORDER — SODIUM CHLORIDE 0.9 % IR SOLN
3000.0000 mL | Status: DC
  Administered 2021-08-25: 3000 mL

## 2021-08-25 MED ORDER — FLUDROCORTISONE ACETATE 0.1 MG PO TABS
0.1000 mg | ORAL_TABLET | Freq: Every day | ORAL | Status: DC
  Administered 2021-08-26: 0.1 mg via ORAL
  Filled 2021-08-25: qty 1

## 2021-08-25 MED ORDER — PROPOFOL 10 MG/ML IV BOLUS
INTRAVENOUS | Status: AC
  Filled 2021-08-25: qty 20

## 2021-08-25 MED ORDER — FENTANYL CITRATE PF 50 MCG/ML IJ SOSY
PREFILLED_SYRINGE | INTRAMUSCULAR | Status: AC
  Administered 2021-08-25: 50 ug via INTRAVENOUS
  Filled 2021-08-25: qty 2

## 2021-08-25 MED ORDER — LEVOTHYROXINE SODIUM 75 MCG PO TABS
75.0000 ug | ORAL_TABLET | Freq: Every day | ORAL | Status: DC
  Administered 2021-08-26: 75 ug via ORAL
  Filled 2021-08-25: qty 1

## 2021-08-25 MED ORDER — MIRTAZAPINE 15 MG PO TABS
15.0000 mg | ORAL_TABLET | Freq: Every day | ORAL | Status: DC
  Administered 2021-08-25: 15 mg via ORAL
  Filled 2021-08-25: qty 1

## 2021-08-25 MED ORDER — LIDOCAINE 2% (20 MG/ML) 5 ML SYRINGE
INTRAMUSCULAR | Status: DC | PRN
  Administered 2021-08-25: 100 mg via INTRAVENOUS

## 2021-08-25 MED ORDER — OXYCODONE HCL 5 MG PO TABS
5.0000 mg | ORAL_TABLET | ORAL | Status: DC | PRN
  Administered 2021-08-25 – 2021-08-26 (×2): 5 mg via ORAL
  Filled 2021-08-25: qty 1

## 2021-08-25 MED ORDER — FLUOXETINE HCL 20 MG PO CAPS
40.0000 mg | ORAL_CAPSULE | Freq: Every morning | ORAL | Status: DC
  Administered 2021-08-26: 40 mg via ORAL
  Filled 2021-08-25: qty 2

## 2021-08-25 MED ORDER — CHLORHEXIDINE GLUCONATE 0.12 % MT SOLN
15.0000 mL | Freq: Once | OROMUCOSAL | Status: AC
  Administered 2021-08-25: 15 mL via OROMUCOSAL

## 2021-08-25 MED ORDER — FENTANYL CITRATE PF 50 MCG/ML IJ SOSY
PREFILLED_SYRINGE | INTRAMUSCULAR | Status: AC
Start: 2021-08-25 — End: 2021-08-26
  Filled 2021-08-25: qty 1

## 2021-08-25 MED ORDER — DEXAMETHASONE SODIUM PHOSPHATE 10 MG/ML IJ SOLN
INTRAMUSCULAR | Status: DC | PRN
  Administered 2021-08-25: 10 mg via INTRAVENOUS

## 2021-08-25 MED ORDER — FENTANYL CITRATE PF 50 MCG/ML IJ SOSY
25.0000 ug | PREFILLED_SYRINGE | INTRAMUSCULAR | Status: DC | PRN
  Administered 2021-08-25 (×2): 50 ug via INTRAVENOUS

## 2021-08-25 MED ORDER — ONDANSETRON HCL 4 MG/2ML IJ SOLN
INTRAMUSCULAR | Status: DC | PRN
  Administered 2021-08-25: 4 mg via INTRAVENOUS

## 2021-08-25 MED ORDER — GENTAMICIN SULFATE 40 MG/ML IJ SOLN
5.0000 mg/kg | INTRAVENOUS | Status: AC
  Administered 2021-08-25: 380 mg via INTRAVENOUS
  Filled 2021-08-25: qty 9.5

## 2021-08-25 MED ORDER — ORAL CARE MOUTH RINSE: 15.0000 mL | Freq: Once | OROMUCOSAL | Status: AC

## 2021-08-25 MED ORDER — FENTANYL CITRATE (PF) 100 MCG/2ML IJ SOLN
INTRAMUSCULAR | Status: DC | PRN
  Administered 2021-08-25: 100 ug via INTRAVENOUS

## 2021-08-25 MED ORDER — ACETAMINOPHEN 10 MG/ML IV SOLN
1000.0000 mg | Freq: Once | INTRAVENOUS | Status: DC | PRN
  Administered 2021-08-25: 1000 mg via INTRAVENOUS

## 2021-08-25 MED ORDER — ONDANSETRON HCL 4 MG/2ML IJ SOLN: 4.0000 mg | Freq: Once | INTRAMUSCULAR | Status: DC | PRN

## 2021-08-25 MED ORDER — FINASTERIDE 5 MG PO TABS
5.0000 mg | ORAL_TABLET | Freq: Every day | ORAL | Status: DC
  Administered 2021-08-26: 5 mg via ORAL
  Filled 2021-08-25: qty 1

## 2021-08-25 MED ORDER — SODIUM CHLORIDE 0.9 % IV SOLN: INTRAVENOUS | Status: DC

## 2021-08-25 MED ORDER — PANTOPRAZOLE SODIUM 40 MG PO TBEC
40.0000 mg | DELAYED_RELEASE_TABLET | Freq: Every day | ORAL | Status: DC
  Administered 2021-08-26: 40 mg via ORAL
  Filled 2021-08-25: qty 1

## 2021-08-25 MED ORDER — PROPOFOL 10 MG/ML IV BOLUS
INTRAVENOUS | Status: DC | PRN
  Administered 2021-08-25: 140 mg via INTRAVENOUS

## 2021-08-25 MED ORDER — ACETAMINOPHEN 500 MG PO TABS
1000.0000 mg | ORAL_TABLET | Freq: Four times a day (QID) | ORAL | Status: AC
  Administered 2021-08-25 – 2021-08-26 (×4): 1000 mg via ORAL
  Filled 2021-08-25 (×4): qty 2

## 2021-08-25 MED ORDER — HYDROMORPHONE HCL 1 MG/ML IJ SOLN
0.5000 mg | INTRAMUSCULAR | Status: DC | PRN
  Administered 2021-08-25 (×3): 1 mg via INTRAVENOUS
  Filled 2021-08-25 (×2): qty 1

## 2021-08-25 MED ORDER — SUCRALFATE 1 G PO TABS
1.0000 g | ORAL_TABLET | Freq: Three times a day (TID) | ORAL | Status: DC
  Administered 2021-08-25 – 2021-08-26 (×4): 1 g via ORAL
  Filled 2021-08-25 (×4): qty 1

## 2021-08-25 MED ORDER — ATORVASTATIN CALCIUM 40 MG PO TABS
80.0000 mg | ORAL_TABLET | Freq: Every day | ORAL | Status: DC
  Administered 2021-08-25 – 2021-08-26 (×2): 80 mg via ORAL
  Filled 2021-08-25 (×2): qty 2

## 2021-08-25 MED ORDER — FENTANYL CITRATE (PF) 100 MCG/2ML IJ SOLN
INTRAMUSCULAR | Status: AC
Start: 2021-08-25 — End: ?
  Filled 2021-08-25: qty 2

## 2021-08-25 MED ORDER — OXYCODONE HCL 5 MG PO TABS
ORAL_TABLET | ORAL | Status: AC
  Filled 2021-08-25: qty 1

## 2021-08-25 MED ORDER — MIDODRINE HCL 5 MG PO TABS
10.0000 mg | ORAL_TABLET | Freq: Three times a day (TID) | ORAL | Status: DC
  Administered 2021-08-25 – 2021-08-26 (×3): 10 mg via ORAL
  Filled 2021-08-25 (×3): qty 2

## 2021-08-25 SURGICAL SUPPLY — 23 items
BAG URINE DRAIN 2000ML AR STRL (UROLOGICAL SUPPLIES) ×2 IMPLANT
BAG URO CATCHER STRL LF (MISCELLANEOUS) ×2 IMPLANT
CATH FOLEY 3WAY 30CC 22FR (CATHETERS) ×1 IMPLANT
CATH FOLEY 3WAY 30CC 24FR (CATHETERS)
CATH URTH STD 24FR FL 3W 2 (CATHETERS) IMPLANT
DRAPE FOOT SWITCH (DRAPES) ×2 IMPLANT
ELECT REM PT RETURN 15FT ADLT (MISCELLANEOUS) IMPLANT
GLOVE BIOGEL M 6.5 STRL (GLOVE) ×1 IMPLANT
GLOVE SURG LX 7.5 STRW (GLOVE) ×1
GLOVE SURG LX STRL 7.5 STRW (GLOVE) ×1 IMPLANT
GOWN STRL REUS W/TWL LRG LVL3 (GOWN DISPOSABLE) ×1 IMPLANT
GUIDEWIRE STR DUAL SENSOR (WIRE) ×1 IMPLANT
HOLDER FOLEY CATH W/STRAP (MISCELLANEOUS) ×1 IMPLANT
IV CATH 14GX2 1/4 (CATHETERS) ×1 IMPLANT
KIT TURNOVER KIT A (KITS) ×1 IMPLANT
LOOP CUT BIPOLAR 24F LRG (ELECTROSURGICAL) ×1 IMPLANT
MANIFOLD NEPTUNE II (INSTRUMENTS) ×2 IMPLANT
PACK CYSTO (CUSTOM PROCEDURE TRAY) ×2 IMPLANT
SYR 30ML LL (SYRINGE) ×2 IMPLANT
SYR TOOMEY IRRIG 70ML (MISCELLANEOUS) ×2
SYRINGE TOOMEY IRRIG 70ML (MISCELLANEOUS) ×1 IMPLANT
TUBING CONNECTING 10 (TUBING) ×2 IMPLANT
TUBING UROLOGY SET (TUBING) ×2 IMPLANT

## 2021-08-25 NOTE — H&P (Signed)
Shawn Lam is an 71 y.o. male.   ? ?Chief Complaint: Pre-Op Transurethral Resection of Prostate ? ?HPI:  ? ?1 - Recurrent Nephrolithiasis -  ?03/2014 - left ureteroscopic stone manipulation for 5 mm ureteral stone to stone free.  ?05/2015 - bilateral URS for small ureterl stones to stone free  ? ?Recent Surveillance:  ?07/2020 - KUB/RUS stable bilateral punctate stones, no hydro.  ?07/2021 - KUB, Renal Ultrasound - stable bilateral punctate stones, no hydro  ? ?2 - Lower Urinary Tract Symptoms - on tamsulosin + finasteride x years for lower urinary tract symptoms with good control. PVR 2021 "4m" (normal) Placed on tamsulosin and notes sig improvement, but becoming more bothersome over time. PVR 2023 "421m (normal). Cysto 2023 with modest bilobar hypertrophy, no strictures. DRE abot 45gm.  ? ?PMH sig for CAD/Stents 2007 (on plavix, no baseline limitations, follows [facility], [facility] [place]), knee surgery, Inspire hypoglossal nerve stimulator for sleep apnea. His PCP is Amy Bedsole with [facility].  ? ? ?Today "CaJeani Hawkingis seen to proceed with transurethral resection of prostate for med-refractory lower urinary tract symptoms. No interval fevers. Most recent UA without infectious parameters.  ? ?Past Medical History:  ?Diagnosis Date  ? Actinic keratosis 02/04/2021  ? right lateral neck  ? Anemia   ? Anxiety   ? Asymptomatic LV dysfunction   ? 50-55% by echo 06/2016  ? Berger's disease   ? CAD (coronary artery disease) 06/2005  ? PCI of LAD and RCA  ? Depression   ? Dilated aortic root ([place]  ? 4cm at sinus of Valsalva  ? Hematuria   ? Hyperlipidemia   ? Hypothyroidism   ? Insomnia   ? Left ureteral stone   ? Orthostatic hypotension   ? negative tilt table  ? OSA (obstructive sleep apnea)   ? moderate with AHI 17/hr, uses CPAP nightly(01/15/19 - has Inspire implant)  ? PVC's (premature ventricular contractions) 07/07/2016  ? Renal disorder   ? PTBiloxiNEPHROLOGIST  ? Rosacea   ? Urticaria    ? ? ?Past Surgical History:  ?Procedure Laterality Date  ? CARDIAC [facility]  [place]06/2013  [place] [facility]    ? COLONOSCOPY WITH PROPOFOL N/A 01/21/2019  ? Procedure: COLONOSCOPY WITH PROPOFOL;  Surgeon: WoLucilla LameMD;  [place];  Laterality: N/A;  sleep apnea  ? CORONARY ANGIOPLASTY  2004  ? STENT PLACEMENT  ? CYSTOSCOPY WITH RETROGRADE PYELOGRAM, URETEROSCOPY AND STENT PLACEMENT Left 03/19/2014  ? Procedure: CYSTOSCOPY WITH RETROGRADE PYELOGRAM, URETEROSCOPY AND STENT PLACEMENT;  Surgeon: JrJunious DresserMD;  [facility];  Service: Urology;  Laterality: Left;  ? CYSTOSCOPY WITH RETROGRADE PYELOGRAM, URETEROSCOPY AND STENT PLACEMENT Bilateral 05/20/2015  ? Procedure:  CYSTOSCOPY WITH BILATERAL RETROGRADE PYELOGRAM,RIGHT  DIAGNOSTIC URETEROSCOPY ,LEFT URETEROSCOPY WITH HOLMIUM LASER  AND BILATERAL  STENT PLACEMENT ;  Surgeon: ThAlexis FrockMD;  [facility];  Service: Urology;  Laterality: Bilateral;  ? DRUG INDUCED ENDOSCOPY N/A 10/20/2017  ? Procedure: DRUG INDUCED SLEEP ENDOSCOPY;  Surgeon: BaMelida QuitterMD;  [place]: E[place];  Laterality: N/A;  ? EP IMPLANTABLE DEVICE N/A 05/13/2016  ? Procedure: Loop Recorder Insertion;  Surgeon: StDeboraha SprangMD;  [facility];  Service: Cardiovascular;  Laterality: N/A;  ? ESOPHAGOGASTRODUODENOSCOPY (EGD) WITH PROPOFOL N/A 11/30/2018  ? Procedure: ESOPHAGOGASTRODUODENOSCOPY (EGD) WITH PROPOFOL;  Surgeon: AnJonathon BellowsMD;  [facility];  Service: Gastroenterology;  Laterality: N/A;  ? ESOPHAGOGASTRODUODENOSCOPY (EGD) WITH PROPOFOL N/A 12/01/2018  ? Procedure: ESOPHAGOGASTRODUODENOSCOPY (EGD) WITH PROPOFOL;  Surgeon: Lucilla Lame, MD;  Location: [facility] ENDOSCOPY;  Service: Endoscopy;  Laterality: N/A;  ? HOLMIUM LASER APPLICATION Bilateral 06/10/5174  ? Procedure: HOLMIUM LASER APPLICATION;  Surgeon: Alexis Frock, MD;  [facility];   Service: Urology;  Laterality: Bilateral;  ? IMPLIMENTATION OF A HYPOGLOSSAL NERVE STIMULATOR  12/08/2017  ? OSA   ? LEFT HEART CATHETERIZATION WITH CORONARY ANGIOGRAM N/A 09/06/2013  ? Procedure: LEFT HEART CATHETERIZATION WITH CORONARY ANGIOGRAM;  Surgeon: Sinclair Grooms, MD;  [facility] CATH LAB;  Service: Cardiovascular;  Laterality: N/A;  ? LEFT KNEE CAP  SURGERY ABOUT 40 YRS AGO  ? 1970    ? STONE EXTRACTION WITH BASKET Left 03/19/2014  ? Procedure: STONE EXTRACTION WITH BASKET;  Surgeon: Junious Dresser, MD;  [facility];  Service: Urology;  Laterality: Left;  ? ? ?Family History  ?Problem Relation Age of Onset  ? Coronary artery disease Mother   ? Heart attack Mother   ? Heart disease Mother   ? Hypertension Mother   ? Cancer Father   ?     lung and colon  ? Lung cancer Father   ?     smoker  ? Diabetes Brother   ? ?Social History:  reports that he has never smoked. He has never used smokeless tobacco. He reports that he does not drink alcohol and does not use drugs. ? ?Allergies:  ?Allergies  ?Allergen Reactions  ? Codeine Nausea Only  ? Tape Other (See Comments)  ?  Use only paper tape. Severe blistering and bruising with other tapes. No cloth tape.  ? ? ?No medications prior to admission.  ? ? ?No results found for this or any previous visit (from the past 48 hour(s)). ?No results found. ? ?Review of Systems  ?Constitutional:  Negative for chills and fever.  ?Genitourinary:  Positive for urgency.  ?All other systems reviewed and are negative. ? ?There were no vitals taken for this visit. ?Physical Exam ?Vitals reviewed.  ?HENT:  ?   Nose: Nose normal.  ?Eyes:  ?   Pupils: Pupils are equal, round, and reactive to light.  ?Cardiovascular:  ?   Rate and Rhythm: Normal rate.  ?Pulmonary:  ?   Effort: Pulmonary effort is normal.  ?Abdominal:  ?   General: Abdomen is flat.  ?Genitourinary: ?   Comments: No CVAT at present ?Musculoskeletal:     ?   General: Normal range of motion.  ?   Cervical back:  Normal range of motion.  ?Skin: ?   General: Skin is warm.  ?Neurological:  ?   General: No focal deficit present.  ?   Mental Status: He is alert.  ?  ? ?Assessment/Plan ? ?Proceed as planned with TURP. Risks, benefits, alternatives, expected peri-op course discussed previously and reiterated today.  ? ?Alexis Frock, MD ?08/25/2021, 8:14 AM ? ? ? ?

## 2021-08-25 NOTE — Brief Op Note (Signed)
08/25/2021 ? ?11:33 AM ? ?PATIENT:  Shawn Lam  71 y.o. male ? ?PRE-OPERATIVE DIAGNOSIS:  PROSTATIC HYPERPLASIA ? ?POST-OPERATIVE DIAGNOSIS:  PROSTATIC HYPERPLASIA ? ?PROCEDURE:  Procedure(s) with comments: ?TRANSURETHRAL RESECTION OF THE PROSTATE (TURP) (N/A) - 1 HR ? ?SURGEON:  Surgeon(s) and Role: ?   Alexis Frock, MD - Primary ? ?PHYSICIAN ASSISTANT:  ? ?ASSISTANTS: none  ? ?ANESTHESIA:   general ? ?EBL:  6m  ? ?BLOOD ADMINISTERED:none ? ?DRAINS:  3 way foley to NS irrigation   ? ?LOCAL MEDICATIONS USED:  NONE ? ?SPECIMEN:  Source of Specimen:  prostate chips ? ?DISPOSITION OF SPECIMEN:  PATHOLOGY ? ?COUNTS:  YES ? ?TOURNIQUET:  * No tourniquets in log * ? ?DICTATION: .Other Dictation: Dictation Number [id number]? ?PLAN OF CARE: Admit for overnight observation ? ?PATIENT DISPOSITION:  PACU - hemodynamically stable. ?  ?Delay start of Pharmacological VTE agent (>24hrs) due to surgical blood loss or risk of bleeding: yes ? ?

## 2021-08-25 NOTE — Anesthesia Procedure Notes (Signed)
Procedure Name: LMA Insertion ?Date/Time: 08/25/2021 11:08 AM ?Performed by: Gean Maidens, CRNA ?Pre-anesthesia Checklist: Patient identified, Emergency Drugs available, Suction available, Patient being monitored and Timeout performed ?Patient Re-evaluated:Patient Re-evaluated prior to induction ?Oxygen Delivery Method: Circle system utilized ?Preoxygenation: Pre-oxygenation with 100% oxygen ?Induction Type: IV induction ?Ventilation: Mask ventilation without difficulty ?LMA: LMA inserted ?LMA Size: 4.0 ?Number of attempts: 1 ?Placement Confirmation: positive ETCO2 and breath sounds checked- equal and bilateral ?Tube secured with: Tape ?Dental Injury: Teeth and Oropharynx as per pre-operative assessment  ? ? ? ? ?

## 2021-08-25 NOTE — Transfer of Care (Signed)
Immediate Anesthesia Transfer of Care Note ? ?Patient: Shawn Lam ? ?Procedure(s) Performed: TRANSURETHRAL RESECTION OF THE PROSTATE (TURP) (Urethra) ? ?Patient Location: PACU ? ?Anesthesia Type:General ? ?Level of Consciousness: drowsy and patient cooperative ? ?Airway & Oxygen Therapy: Patient Spontanous Breathing and Patient connected to face mask oxygen ? ?Post-op Assessment: Report given to RN and Post -op Vital signs reviewed and stable ? ?Post vital signs: Reviewed and stable ? ?Last Vitals:  ?Vitals Value Taken Time  ?BP 121/65 08/25/21 1139  ?Temp    ?Pulse 54 08/25/21 1141  ?Resp 16 08/25/21 1141  ?SpO2 100 % 08/25/21 1141  ?Vitals shown include unvalidated device data. ? ?Last Pain:  ?Vitals:  ? 08/25/21 0941  ?TempSrc:   ?PainSc: 0-No pain  ?   ? ?  ? ?Complications: No notable events documented. ?

## 2021-08-25 NOTE — Discharge Instructions (Addendum)
1 - You may have urinary urgency (bladder spasms) and bloody urine on / off for up to 2 weeks. This is normal. ° °2 - Restart Plavix when no visible blood in urine x 24 hours.  ° °3 - Call MD or go to ER for fever >102, severe pain / nausea / vomiting not relieved by medications, or acute change in medical status ° °

## 2021-08-25 NOTE — Op Note (Signed)
NAME: Shawn Lam, Shawn Lam. MEDICAL RECORD NO: [id number] ACCOUNT NO: [id number] DATE OF BIRTH: 06-May-1950 FACILITY: [facility] LOCATION: [facility] PHYSICIAN: Alexis Frock, MD  Operative Report   DATE OF PROCEDURE: 08/25/2021  PREOPERATIVE DIAGNOSIS:  Prostatic hypertrophy with refractory lower urinary tract symptoms.  PROCEDURE:  Transurethral resection of prostate.  ESTIMATED BLOOD LOSS:  50 mL  COMPLICATIONS:  None.  SPECIMEN:  Prostate chips for permanent pathology.  FINDINGS: 1.  Moderate bilobar prostatic hypertrophy, pre-resection. 2.  Wide open fossa from the bladder neck to the verumontanum post-resection.  DRAINS:  Three-way Foley catheter to normal saline irrigation, 20 mL sterile water in the balloon.  INDICATIONS:  The patient is a pleasant 71 year old man with longstanding history of recurrent urolithiasis.  He is very compliant with surveillance on this.  He also has a history of lower urinary tract symptoms.  He has been on maximal medical therapy  with alpha blockers and 5-alpha reductase inhibitor for number of years and has done well; however, his symptoms have been slowly progressive over the last several years.  We discussed options for further management including third line medical therapy  versus outlet procedure.  He wishes to proceed with the latter and given his estimated volume prostate is around 45 grams and an intact bladder function it was felt the transurethral resection would be ideal.  He wished to proceed.  Informed consent was  obtained and placed in medical record.  PROCEDURE IN DETAIL:  The patient is being identified, procedure being transurethral resection of prostate was confirmed.  Procedure timeout was performed.  Intravenous antibiotics were administered.  General anesthesia was induced.  The patient was  placed into a low lithotomy position.  Sterile field was created, prepped and draped the patient's penis, perineum, and proximal thighs using  iodine.  Cystourethroscopy was performed using a 26-French resectoscope sheath and visual obturator.  Inspection  of anterior and posterior urethra revealed some moderate bilobar prostatic hypertrophy with kissing lobes.  No median lobe.  Inspection of urinary bladder revealed no diverticula, calcifications, papillary lesions.  Ureteral orifices were single  bilaterally.  The resectoscope loop was then carefully used to perform transurethral resection in a top down approach first at 12 o'clock position from the bladder neck to the verumontanum down to the superficial stroma of the prostate capsule then of  the left lobe of the prostate from the 12 o'clock to 6 o'clock position and then finally of the right lobe of the prostate from the 12 o'clock to 6 o'clock position.  Exquisite care was taken to avoid undermining the bladder neck or resection distal to  verumontanum. This generated prostate chips, which were irrigated and set aside for permanent pathology. Post-resection I was very happy with the prostatic fossa, which was now wide open from the bladder neck to verumontanum.  Resectoscope loop was then  used to provide coagulation current.  The entire base of the resection bed and a descending spiral technique. After visualization of the ureteral orifices, which were uninjured a sensor wire was placed to the level of the urinary bladder under  cystoscopic vision over which a new 3-way Foley catheter was placed using a Council technique, 20 mL sterile water in the balloon secondary to very light irrigation and light catheter traction.  Efflux was essentially clear.  Procedure was terminated.   The patient tolerated the procedure well, no immediate periprocedural complications.  The patient was taken to postanesthesia care unit in stable condition.  Plan for continued observation admission, likely discharge  home tomorrow with catheter in place with outpatient trial of void in several days.   PUS D:  08/25/2021 11:39:29 am T: 08/25/2021 2:04:00 pm  JOB: [id number]/ [id number]

## 2021-08-25 NOTE — Anesthesia Postprocedure Evaluation (Signed)
Anesthesia Post Note ? ?Patient: Shawn Lam ? ?Procedure(s) Performed: TRANSURETHRAL RESECTION OF THE PROSTATE (TURP) (Urethra) ? ?  ? ?Patient location during evaluation: PACU ?Anesthesia Type: General ?Level of consciousness: awake and alert ?Pain management: pain level controlled ?Vital Signs Assessment: post-procedure vital signs reviewed and stable ?Respiratory status: spontaneous breathing, nonlabored ventilation, respiratory function stable and patient connected to nasal cannula oxygen ?Cardiovascular status: blood pressure returned to baseline and stable ?Postop Assessment: no apparent nausea or vomiting ?Anesthetic complications: no ? ? ?No notable events documented. ? ?Last Vitals:  ?Vitals:  ? 08/25/21 1400 08/25/21 1441  ?BP: 130/70 128/63  ?Pulse: (!) 53 63  ?Resp: (!) 9 18  ?Temp: 36.6 ?C 36.5 ?C  ?SpO2: 96% 94%  ?  ?Last Pain:  ?Vitals:  ? 08/25/21 1511  ?TempSrc:   ?PainSc: 4   ? ? ?  ?  ?  ?  ?  ?  ? ?[place] ? ? ? ? ?

## 2021-08-26 ENCOUNTER — Encounter (HOSPITAL_COMMUNITY): Payer: Self-pay | Admitting: Urology

## 2021-08-26 DIAGNOSIS — Z7902 Long term (current) use of antithrombotics/antiplatelets: Secondary | ICD-10-CM | POA: Diagnosis not present

## 2021-08-26 DIAGNOSIS — D759 Disease of blood and blood-forming organs, unspecified: Secondary | ICD-10-CM | POA: Diagnosis not present

## 2021-08-26 DIAGNOSIS — N401 Enlarged prostate with lower urinary tract symptoms: Secondary | ICD-10-CM | POA: Diagnosis not present

## 2021-08-26 DIAGNOSIS — Z79899 Other long term (current) drug therapy: Secondary | ICD-10-CM | POA: Diagnosis not present

## 2021-08-26 DIAGNOSIS — I2510 Atherosclerotic heart disease of native coronary artery without angina pectoris: Secondary | ICD-10-CM | POA: Diagnosis not present

## 2021-08-26 DIAGNOSIS — G4730 Sleep apnea, unspecified: Secondary | ICD-10-CM | POA: Diagnosis not present

## 2021-08-26 LAB — BASIC METABOLIC PANEL
Anion gap: 5 (ref 5–15)
BUN: 16 mg/dL (ref 8–23)
CO2: 28 mmol/L (ref 22–32)
Calcium: 8.1 mg/dL — ABNORMAL LOW (ref 8.9–10.3)
Chloride: 107 mmol/L (ref 98–111)
Creatinine, Ser: 1.24 mg/dL (ref 0.61–1.24)
GFR, Estimated: >60 mL/min (ref >60)
Glucose, Bld: 128 mg/dL — ABNORMAL HIGH (ref 70–99)
Potassium: 3.7 mmol/L (ref 3.5–5.1)
Sodium: 140 mmol/L (ref 135–145)

## 2021-08-26 LAB — HEMOGLOBIN AND HEMATOCRIT, BLOOD
HCT: 38.2 % — ABNORMAL LOW (ref 39.0–52.0)
Hemoglobin: 12.4 g/dL — ABNORMAL LOW (ref 13.0–17.0)

## 2021-08-26 MED ORDER — SENNOSIDES-DOCUSATE SODIUM 8.6-50 MG PO TABS: 1.0000 | ORAL_TABLET | Freq: Two times a day (BID) | ORAL | 0 refills | Status: DC

## 2021-08-26 MED ORDER — CHLORHEXIDINE GLUCONATE CLOTH 2 % EX PADS
6.0000 | MEDICATED_PAD | Freq: Every day | CUTANEOUS | Status: DC
  Administered 2021-08-26: 6 via TOPICAL

## 2021-08-26 MED ORDER — SULFAMETHOXAZOLE-TRIMETHOPRIM 800-160 MG PO TABS: 1.0000 | ORAL_TABLET | Freq: Two times a day (BID) | ORAL | 0 refills | Status: DC

## 2021-08-26 MED ORDER — OXYCODONE-ACETAMINOPHEN 5-325 MG PO TABS
1.0000 | ORAL_TABLET | Freq: Four times a day (QID) | ORAL | 0 refills | Status: DC | PRN
Start: 2021-08-26 — End: 2021-09-01

## 2021-08-26 NOTE — Discharge Summary (Signed)
Physician Discharge Summary  Patient ID: Shawn Lam MRN: [id number] DOB/AGE: 1950/05/19 71 y.o.  Admit date: 08/25/2021 Discharge date: 08/26/2021  Admission Diagnoses: Prostatic Hypertrophy with Refractory Lower Urinary Tract Symptoms  Discharge Diagnoses:  Principal Problem:   Prostatic hyperplasia   Discharged Condition: good  Hospital Course: Pt underwent transurethral resection of prostate on 08/25/21 without acute complications. Admitted to 4th floor Urology service post-op on gentle bladder irrigation which was slowly weaned to off. By the afternoon of POD 1 he is ambulatory, pain controlled on PO meds, maintaining PO nutrition, and fel tto be adequate for discharge with catheter in place. Path pending at discharge. Cr 1.2, Hgb 12.4   Consults: None  Significant Diagnostic Studies: labs: as per above  Treatments: surgery: as per above  Discharge Exam: Blood pressure 113/76, pulse (!) 52, temperature 97.9 F (36.6 C), temperature source Oral, resp. rate 18, height 6' (1.829 m), weight 82.3 kg, SpO2 97 %.  NAD, AOx3, pleasant and at baseline Non-labored breathing on RA RRR SNTND Foley in place with very light pink urine off irrigation. No c/c/e  Disposition: HOME   Allergies as of 08/26/2021       Reactions   Codeine Nausea Only   Tape Other (See Comments)   Use only paper tape. Severe blistering and bruising with other tapes. No cloth tape.        Medication List     STOP taking these medications    clopidogrel 75 MG tablet Commonly known as: PLAVIX   tamsulosin 0.4 MG Caps capsule Commonly known as: FLOMAX       TAKE these medications    atorvastatin 80 MG tablet Commonly known as: LIPITOR Take 1 tablet (80 mg total) by mouth daily.   busPIRone 10 MG tablet Commonly known as: BUSPAR TAKE 2 TABLETS BY MOUTH 3 TIMES DAILY.   denosumab 60 MG/ML Sosy injection Commonly known as: PROLIA Inject 60 mg into the skin every 6 (six) months. Do  not send RX, order this from the office   ferrous sulfate 325 (65 FE) MG tablet TAKE 1 TABLET BY MOUTH 3 TIMES DAILY WITH MEALS.   finasteride 5 MG tablet Commonly known as: PROSCAR Take 5 mg by mouth daily.   fludrocortisone 0.1 MG tablet Commonly known as: FLORINEF Take 1 tablet (0.1 mg total) by mouth daily.   FLUoxetine 40 MG capsule Commonly known as: PROZAC TAKE 1 CAPSULE EVERY       MORNING   FLUoxetine 20 MG capsule Commonly known as: PROZAC TAKE 1 CAPSULE DAILY. TAKE WITH '40MG'$  TO EQUAL '60MG'$     ONCE DAILY   furosemide 20 MG tablet Commonly known as: LASIX TAKE ONE TABLET BY MOUTH DAILY AS NEEDED FOR SWELLING AND SHORTNESS OF BREATH, TAKE SPARINGLY   gabapentin 100 MG capsule Commonly known as: NEURONTIN TAKE TWO CAPSULES BY MOUTH TWICE A DAY   levothyroxine 75 MCG tablet Commonly known as: SYNTHROID TAKE 1 TABLET BY MOUTH EVERY DAY BEFORE BREAKFAST   midodrine 10 MG tablet Commonly known as: PROAMATINE Take 1 tablet (10 mg total) by mouth 3 (three) times daily.   mirtazapine 15 MG tablet Commonly known as: REMERON TAKE 1 TABLET BY MOUTH EVERYDAY AT BEDTIME   multivitamin with minerals tablet Take 1 tablet by mouth daily.   nitroGLYCERIN 0.4 MG SL tablet Commonly known as: Nitrostat Place 1 tablet (0.4 mg total) under the tongue every 5 (five) minutes as needed for chest pain.   oxyCODONE-acetaminophen 5-325 MG tablet Commonly known  as: Percocet Take 1 tablet by mouth every 6 (six) hours as needed for severe pain or moderate pain (post-operatively).   pantoprazole 40 MG tablet Commonly known as: PROTONIX Take 1 tablet (40 mg total) by mouth daily.   senna-docusate 8.6-50 MG tablet Commonly known as: Senokot-S Take 1 tablet by mouth 2 (two) times daily. While taking strong pain meds to prevent constipation   sucralfate 1 g tablet Commonly known as: CARAFATE TAKE 1 TABLET BY MOUTH FOUR TIMES A DAY WITH MEALS AND AT BEDTIME    sulfamethoxazole-trimethoprim 800-160 MG tablet Commonly known as: BACTRIM DS Take 1 tablet by mouth 2 (two) times daily. X 3 days. Begin day before next Urology appointment.   triamcinolone cream 0.1 % Commonly known as: KENALOG APPLY 1 APPLICATION TOPICALLY 2 (TWO) TIMES DAILY. AVOID FACE, GROIN, UNDERARMS. What changed:  when to take this reasons to take this   zolpidem 10 MG tablet Commonly known as: AMBIEN TAKE ONE TABLET BY MOUTH EVERY NIGHT AT BEDTIME AS NEEDED        Follow-up Information     Alexis Frock, MD Follow up on 08/30/2021.   Specialty: Urology Why: at 11:30 for MD visit and catheter removal. Contact information: [place] [id number] [phone]                 Signed: Alexis Frock 08/26/2021, 1:22 PM

## 2021-08-26 NOTE — Plan of Care (Signed)

## 2021-08-26 NOTE — TOC Transition Note (Signed)
Transition of Care [facility]) - CM/SW Discharge Note   Patient Details  Name: Shawn Lam MRN: [id number] Date of Birth: Sep 13, 1950  [facility] CM/SW Contact:  Leeroy Cha, RN Phone Number: 08/26/2021, 2:14 PM   Clinical Narrative:    Dcd to home with no toc needs    Final next level of care: Home/Self Care Barriers to Discharge: No Barriers Identified   Patient Goals and CMS Choice Patient states their goals for this hospitalization and ongoing recovery are:: to go hoime      Discharge Placement                       Discharge Plan and Services   Discharge Planning Services: CM Consult                                 Social Determinants of Health (SDOH) Interventions     Readmission Risk Interventions     View : No data to display.

## 2021-08-27 ENCOUNTER — Encounter: Payer: Self-pay | Admitting: Dermatology

## 2021-08-30 ENCOUNTER — Telehealth: Payer: Self-pay | Admitting: *Deleted

## 2021-08-30 DIAGNOSIS — I493 Ventricular premature depolarization: Secondary | ICD-10-CM

## 2021-08-30 LAB — SURGICAL PATHOLOGY

## 2021-08-30 NOTE — Telephone Encounter (Signed)
-----   Message from Rise Mu, PA-C sent at 08/30/2021  7:08 AM EDT ----- Heart monitor showed a predominant rhythm of sinus with an average rate of 68 bpm (range 40 to 193 bpm), 11 episodes of SVT with the fastest interval lasting 4 beats with a maximal rate of 193 bpm and the longest interval lasting 15 beats, occasional PACs, rare atrial couplets, rare atrial triplets, frequent PVCs, rare ventricular couplets and triplets.  Patient triggered events were associated with PVCs.  Patient has a history of significant orthostatic hypotension with associated syncope.  In this setting, we are unable to add beta-blockade for PVC suppression.  Echo earlier this month showed a low normal LV systolic function.  Recommend referral to EP for further management of frequent PVCs.

## 2021-08-30 NOTE — Telephone Encounter (Signed)
Reviewed results and recommendations with patient. Referral discussed and confirmed upcoming appointment. Advised that someone would reach out to assist with getting appointment set up with EP provider. He verbalized understanding of our conversation with no further questions at this time.

## 2021-08-31 ENCOUNTER — Other Ambulatory Visit: Payer: Self-pay | Admitting: Family Medicine

## 2021-08-31 NOTE — Telephone Encounter (Signed)
Last office visit 07/30/21 for stomach issues.  Last refilled 07/14/21 for #30 with no refills. CPE scheduled 03/29/22.

## 2021-09-01 ENCOUNTER — Ambulatory Visit (INDEPENDENT_AMBULATORY_CARE_PROVIDER_SITE_OTHER): Payer: Medicare Other | Admitting: Cardiology

## 2021-09-01 ENCOUNTER — Encounter: Payer: Self-pay | Admitting: Cardiology

## 2021-09-01 VITALS — BP 128/68 | HR 60 | Ht 72.0 in | Wt 172.0 lb

## 2021-09-01 DIAGNOSIS — I951 Orthostatic hypotension: Secondary | ICD-10-CM | POA: Diagnosis not present

## 2021-09-01 DIAGNOSIS — I493 Ventricular premature depolarization: Secondary | ICD-10-CM | POA: Diagnosis not present

## 2021-09-01 DIAGNOSIS — I25118 Atherosclerotic heart disease of native coronary artery with other forms of angina pectoris: Secondary | ICD-10-CM

## 2021-09-01 DIAGNOSIS — R55 Syncope and collapse: Secondary | ICD-10-CM

## 2021-09-01 NOTE — Patient Instructions (Signed)
Medications: Your physician recommends that you continue on your current medications as directed. Please refer to the Current Medication list given to you today. *If you need a refill on your cardiac medications before your next appointment, please call your pharmacy*  Lab Work: None. If you have labs (blood work) drawn today and your tests are completely normal, you will receive your results only by: [place] (if you have MyChart) OR A paper copy in the mail If you have any lab test that is abnormal or we need to change your treatment, we will call you to review the results.  Testing/Procedures: None.  Follow-Up: At [facility], you and your health needs are our priority.  As part of our continuing mission to provide you with exceptional heart care, we have created designated Provider Care Teams.  These Care Teams include your primary Cardiologist (physician) and Advanced Practice Providers (APPs -  Physician Assistants and Nurse Practitioners) who all work together to provide you with the care you need, when you need it.  Your physician wants you to follow-up in: As needed with Lars Mage, MD or one of the following Advanced Practice Providers on your designated Care Team:    Ignacia Bayley, NP Christell Faith PA Cadence Kathlen Mody PA    You will receive a reminder letter in the mail two months in advance. If you don't receive a letter, please call our office to schedule the follow-up appointment.  We recommend signing up for the patient portal called "MyChart".  Sign up information is provided on this After Visit Summary.  MyChart is used to connect with patients for Virtual Visits (Telemedicine).  Patients are able to view lab/test results, encounter notes, upcoming appointments, etc.  Non-urgent messages can be sent to your provider as well.   To learn more about what you can do with MyChart, go to [email or web address].    Any Other Special Instructions Will Be Listed Below (If  Applicable).

## 2021-09-01 NOTE — Progress Notes (Signed)
Electrophysiology Office Note:    Date:  09/01/2021   ID:  [name], DOB 1951-02-22, MRN [id number]  PCP:  Shawn Sanders, MD  [facility] Cardiologist:  Shawn Rogue, MD  [place] Electrophysiologist:  None   Referring MD: Shawn Mu, PA-C   Chief Complaint: PVCs  History of Present Illness:    Shawn Lam is a 71 y.o. male who presents for an evaluation of PVCs at the request of Shawn Faith, PA-C. Their medical history includes coronary artery disease post PCI of the LAD and RCA, dilated aortic root, hyperlipidemia, hypothyroidism and obstructive sleep apnea.  The patient last saw Shawn Lam on August 02, 2021.  That appointment was for recurrent syncope in the setting of orthostatic hypotension.  He has previously had a loop recorder implanted in 2018 which showed no evidence of arrhythmia, pauses or high degree AV block.  He is on midodrine 10 mg by mouth 3 times a day.  Today he tells me that he passes out roughly 1 time a month.  Is typically when he is stood up from a seated position.  They do not occur while seated.  He does not have a long prodrome.  He is fairly active and takes walks almost daily.  He also describes palpitations that come and go.  He will feel them in his neck and head.  The syncopal episodes are not associated with these palpitations.    Past Medical History:  Diagnosis Date   Actinic keratosis 02/04/2021   right lateral neck   Anemia    Anxiety    Asymptomatic LV dysfunction    50-55% by echo 06/2016   Berger's disease    CAD (coronary artery disease) 06/2005   PCI of LAD and RCA   Depression    Dilated aortic root (HCC)    4cm at sinus of Valsalva   Hematuria    Hyperlipidemia    Hypothyroidism    Insomnia    Left ureteral stone    Orthostatic hypotension    negative tilt table   OSA (obstructive sleep apnea)    moderate with AHI 17/hr, uses CPAP nightly(01/15/19 - has Inspire implant)   PVC's (premature ventricular  contractions) 07/07/2016   Renal disorder    PT IS SEEING DR. Lorrene Lam- NEPHROLOGIST   Rosacea    Urticaria     Past Surgical History:  Procedure Laterality Date   CARDIAC CATHETERIZATION  09/06/2013   [facility]   CARDIAC CATHETERIZATION     COLONOSCOPY WITH PROPOFOL N/A 01/21/2019   Procedure: COLONOSCOPY WITH PROPOFOL;  Surgeon: Lucilla Lame, MD;  Location: [place];  Service: Endoscopy;  Laterality: N/A;  sleep apnea   CORONARY ANGIOPLASTY  2004   STENT PLACEMENT   CYSTOSCOPY WITH RETROGRADE PYELOGRAM, URETEROSCOPY AND STENT PLACEMENT Left 03/19/2014   Procedure: CYSTOSCOPY WITH RETROGRADE PYELOGRAM, URETEROSCOPY AND STENT PLACEMENT;  Surgeon: Junious Dresser, MD;  Location: [facility];  Service: Urology;  Laterality: Left;   CYSTOSCOPY WITH RETROGRADE PYELOGRAM, URETEROSCOPY AND STENT PLACEMENT Bilateral 05/20/2015   Procedure:  CYSTOSCOPY WITH BILATERAL RETROGRADE PYELOGRAM,RIGHT  DIAGNOSTIC URETEROSCOPY ,LEFT URETEROSCOPY WITH HOLMIUM LASER  AND BILATERAL  STENT PLACEMENT ;  Surgeon: Alexis Frock, MD;  Location: [facility];  Service: Urology;  Laterality: Bilateral;   DRUG INDUCED ENDOSCOPY N/A 10/20/2017   Procedure: DRUG INDUCED SLEEP ENDOSCOPY;  Surgeon: Melida Quitter, MD;  Location: [place];  Service: ENT;  Laterality: N/A;   EP IMPLANTABLE DEVICE N/A 05/13/2016  Procedure: Loop Recorder Insertion;  Surgeon: Deboraha Sprang, MD;  Location: [place] CV [facility];  Service: Cardiovascular;  Laterality: N/A;   ESOPHAGOGASTRODUODENOSCOPY (EGD) WITH PROPOFOL N/A 11/30/2018   Procedure: ESOPHAGOGASTRODUODENOSCOPY (EGD) WITH PROPOFOL;  Surgeon: Jonathon Bellows, MD;  Location: [facility] ENDOSCOPY;  Service: Gastroenterology;  Laterality: N/A;   ESOPHAGOGASTRODUODENOSCOPY (EGD) WITH PROPOFOL N/A 12/01/2018   Procedure: ESOPHAGOGASTRODUODENOSCOPY (EGD) WITH PROPOFOL;  Surgeon: Lucilla Lame, MD;  Location[facility] ENDOSCOPY;  Service: Endoscopy;  Laterality: N/A;   HOLMIUM LASER APPLICATION  Bilateral 0/05/7739   Procedure: HOLMIUM LASER APPLICATION;  Surgeon: Alexis Frock, MD;  Location: [facility];  Service: Urology;  Laterality: Bilateral;   IMPLIMENTATION OF A HYPOGLOSSAL NERVE STIMULATOR  12/08/2017   OSA    LEFT HEART CATHETERIZATION WITH CORONARY ANGIOGRAM N/A 09/06/2013   Procedure: LEFT HEART CATHETERIZATION WITH CORONARY ANGIOGRAM;  Surgeon: Sinclair Grooms, MD;  Location: [facility] CATH LAB;  Service: Cardiovascular;  Laterality: N/A;   LEFT KNEE CAP  SURGERY ABOUT 40 YRS AGO  ? 1970     STONE EXTRACTION WITH BASKET Left 03/19/2014   Procedure: STONE EXTRACTION WITH BASKET;  Surgeon: Junious Dresser, MD;  Location: [facility];  Service: Urology;  Laterality: Left;   TRANSURETHRAL RESECTION OF PROSTATE N/A 08/25/2021   Procedure: TRANSURETHRAL RESECTION OF THE PROSTATE (TURP);  Surgeon: Alexis Frock, MD;  Location: [facility];  Service: Urology;  Laterality: N/A;  1 HR    Current Medications: Current Meds  Medication Sig   atorvastatin (LIPITOR) 80 MG tablet Take 1 tablet (80 mg total) by mouth daily.   busPIRone (BUSPAR) 10 MG tablet TAKE 2 TABLETS BY MOUTH 3 TIMES DAILY.   denosumab (PROLIA) 60 MG/ML SOSY injection Inject 60 mg into the skin every 6 (six) months. Do not send RX, order this from the office   ferrous sulfate 325 (65 FE) MG tablet TAKE 1 TABLET BY MOUTH 3 TIMES DAILY WITH MEALS.   finasteride (PROSCAR) 5 MG tablet Take 5 mg by mouth daily.   fludrocortisone (FLORINEF) 0.1 MG tablet Take 1 tablet (0.1 mg total) by mouth daily.   FLUoxetine (PROZAC) 20 MG capsule TAKE 1 CAPSULE DAILY. TAKE WITH '40MG'$  TO EQUAL '60MG'$     ONCE DAILY   FLUoxetine (PROZAC) 40 MG capsule TAKE 1 CAPSULE EVERY       MORNING   furosemide (LASIX) 20 MG tablet TAKE ONE TABLET BY MOUTH DAILY AS NEEDED FOR SWELLING AND SHORTNESS OF BREATH, TAKE SPARINGLY   gabapentin (NEURONTIN) 100 MG capsule TAKE TWO CAPSULES BY MOUTH TWICE A DAY   levothyroxine (SYNTHROID) 75 MCG tablet TAKE 1 TABLET BY MOUTH  EVERY DAY BEFORE BREAKFAST   midodrine (PROAMATINE) 10 MG tablet Take 1 tablet (10 mg total) by mouth 3 (three) times daily.   mirtazapine (REMERON) 15 MG tablet TAKE 1 TABLET BY MOUTH EVERYDAY AT BEDTIME   Multiple Vitamins-Minerals (MULTIVITAMIN WITH MINERALS) tablet Take 1 tablet by mouth daily.   nitroGLYCERIN (NITROSTAT) 0.4 MG SL tablet Place 1 tablet (0.4 mg total) under the tongue every 5 (five) minutes as needed for chest pain.   pantoprazole (PROTONIX) 40 MG tablet Take 1 tablet (40 mg total) by mouth daily.   sucralfate (CARAFATE) 1 g tablet TAKE 1 TABLET BY MOUTH FOUR TIMES A DAY WITH MEALS AND AT BEDTIME   triamcinolone cream (KENALOG) 0.1 % APPLY 1 APPLICATION TOPICALLY 2 (TWO) TIMES DAILY. AVOID FACE, GROIN, UNDERARMS. (Patient taking differently: Apply 1 application. topically 2 (two) times daily as needed (irritation).  Avoid face, groin, underarms.)   zolpidem (AMBIEN) 10 MG tablet TAKE ONE TABLET BY MOUTH EVERY NIGHT AT BEDTIME AS NEEDED   [DISCONTINUED] oxyCODONE-acetaminophen (PERCOCET) 5-325 MG tablet Take 1 tablet by mouth every 6 (six) hours as needed for severe pain or moderate pain (post-operatively).   [DISCONTINUED] senna-docusate (SENOKOT-S) 8.6-50 MG tablet Take 1 tablet by mouth 2 (two) times daily. While taking strong pain meds to prevent constipation   [DISCONTINUED] sulfamethoxazole-trimethoprim (BACTRIM DS) 800-160 MG tablet Take 1 tablet by mouth 2 (two) times daily. X 3 days. Begin day before next Urology appointment.     Allergies:   Codeine and Tape   Social History   Socioeconomic History   Marital status: Married    Spouse name: Not on file   Number of children: Not on file   Years of education: Not on file   Highest education level: Not on file  Occupational History   Occupation: retired Print production planner  Tobacco Use   Smoking status: Never   Smokeless tobacco: Never  Vaping Use   Vaping Use: Never used  Substance and Sexual Activity   Alcohol use:  Never    Alcohol/week: 1.0 standard drink    Types: 1 Cans of beer per week   Drug use: No   Sexual activity: Yes    Partners: Female    Birth control/protection: None  Other Topics Concern   Not on file  Social History Narrative   Regular exercise--yes--jog 5 miles 6 days a week   Social Determinants of Radio broadcast assistant Strain: Low Risk    Difficulty of Paying Living Expenses: Not hard at all  Food Insecurity: No Food Insecurity   Worried About Charity fundraiser in the Last Year: Never true   Arboriculturist in the Last Year: Never true  Transportation Needs: No Transportation Needs   Lack of Transportation (Medical): No   Lack of Transportation (Non-Medical): No  Physical Activity: Sufficiently Active   Days of Exercise per Week: 7 days   Minutes of Exercise per Session: 50 min  Stress: No Stress Concern Present   Feeling of Stress : Not at all  Social Connections: Moderately Isolated   Frequency of Communication with Friends and Family: Twice a week   Frequency of Social Gatherings with Friends and Family: More than three times a week   Attends Religious Services: Never   Marine scientist or Organizations: No   Attends Music therapist: Never   Marital Status: Married     Family History: The patient's family history includes Cancer in his father; Coronary artery disease in his mother; Diabetes in his brother; Heart attack in his mother; Heart disease in his mother; Hypertension in his mother; Lung cancer in his father.  ROS:   Please see the history of present illness.    All other systems reviewed and are negative.  EKGs/Labs/Other Studies Reviewed:    The following studies were reviewed today:  Aug 29, 2021 ZIO monitor personally reviewed Heart rate 40-1 93, average 68 bpm Occasional supraventricular ectopy, 1.7% Frequent ventricular ectopy, 8.4% At least 3 different morphologies of PVC  November 04, 2013 cardiac MRI FINDINGS:   Limited views of the lung fields showed no gross abnormalities.  Normal left ventricular size and systolic function, EF 09%. No  regional wall motion abnormalities. Normal wall thickness. Normal  right ventricular size and systolic function. Trileaflet aortic  valve with no significant stenosis or regurgitation. No significant  mitral regurgitation noted though flow sequences not Lam. Normal  right and left atrial sizes.   On delayed enhancement imaging, there was no myocardial delayed  enhancement noted.    EKG:  The ekg ordered today demonstrates sinus rhythm.  No PVCs.  Short PR interval.   Recent Labs: 03/19/2021: TSH 1.32 07/30/2021: ALT 30 08/16/2021: Platelets 143 08/26/2021: BUN 16; Creatinine, Ser 1.24; Hemoglobin 12.4; Potassium 3.7; Sodium 140  Recent Lipid Panel    Component Value Date/Time   CHOL 128 03/19/2021 1015   CHOL 145 07/19/2016 0853   TRIG 46.0 03/19/2021 1015   HDL 73.70 03/19/2021 1015   HDL 70 07/19/2016 0853   CHOLHDL 2 03/19/2021 1015   VLDL 9.2 03/19/2021 1015   LDLCALC 45 03/19/2021 1015   LDLCALC 63 07/19/2016 0853   LDLDIRECT 57 06/22/2009 1228    Physical Exam:    VS:  BP 128/68   Pulse 60   Ht 6' (1.829 m)   Wt 172 lb (78 kg)   SpO2 97%   BMI 23.33 kg/m     Wt Readings from Last 3 Encounters:  09/01/21 172 lb (78 kg)  08/26/21 181 lb 7 oz (82.3 kg)  08/16/21 172 lb 2 oz (78.1 kg)     GEN:  Well nourished, well developed in no acute distress HEENT: Normal NECK: No JVD; No carotid bruits LYMPHATICS: No lymphadenopathy CARDIAC: RRR, no murmurs, rubs, gallops RESPIRATORY:  Clear to auscultation without rales, wheezing or rhonchi  ABDOMEN: Soft, non-tender, non-distended MUSCULOSKELETAL:  No edema; No deformity  SKIN: Warm and dry NEUROLOGIC:  Alert and oriented x 3 PSYCHIATRIC:  Normal affect       ASSESSMENT:    1. PVC's (premature ventricular contractions)   2. Syncope, unspecified syncope type   3. Orthostatic syncope    4. Coronary artery disease of native artery of native heart with stable angina pectoris ([place])    PLAN:    In order of problems listed above:  #PVCs Relatively low burden, less than 10%.  Multiple morphologies.  He is not going to tolerate beta-blockade or calcium channel blockers due to his orthostatic intolerance.  I think weighing the risks and benefits of suppressive medical therapy, I am in favor of a continued conservative approach.  If the PVCs were to worsen, could consider antiarrhythmic therapy to see if we could successfully suppress the PVCs while avoiding worsening of his orthostatic intolerance.  #Orthostatic intolerance #Syncope Long discussion today about his orthostatic intolerance and syncopal episodes.  I have recommended continuing Florinef and midodrine.  He should stay adequately hydrated.  I have encouraged him to wear compression stockings that are at least knee-high.  He should continue with regular aerobic exercise.   Follow-up with me on an as-needed basis.   Total time spent with patient today 33 minutes. This includes reviewing records, evaluating the patient and coordinating care.  Medication Adjustments/Labs and Tests Ordered: Current medicines are reviewed at length with the patient today.  Concerns regarding medicines are outlined above.  No orders of the defined types were placed in this encounter.  No orders of the defined types were placed in this encounter.    Signed, Hilton Cork. Quentin Ore, MD, [facility], [facility] 09/01/2021 10:55 AM    Electrophysiology [facility]

## 2021-09-03 NOTE — Addendum Note (Signed)
Addended by: Raelene Bott, Yahayra Geis L on: 09/03/2021 01:06 PM   Modules accepted: Orders

## 2021-09-04 NOTE — Assessment & Plan Note (Addendum)
This is most likely cause of his symptoms.  He is currently on midodrine 10 mg 3 times daily. I have encouraged him to contact cardiology for further evaluation and treatment  especially if his labs returned normal

## 2021-09-04 NOTE — Assessment & Plan Note (Signed)
Chronic, description is most consistent with orthostatic hypotension which she has been troubled with in the past.  He is currently on midodrine 10 mg 3 times daily.  I will evaluate with labs to make sure there is no new anemia electrolyte issues or other causes for syncopal event.  EKG in the office today was unremarkable.

## 2021-09-04 NOTE — Assessment & Plan Note (Signed)
Acute, likely due to reflux poorly controlled.  Increase pantoprazole to  to 2 times daily. Start Carafate 4 times daily as needed... stop if it does not help.

## 2021-09-04 NOTE — Assessment & Plan Note (Signed)
Some of his abdominal symptoms are concerning for recurrence of duodenal ulcer.  He is on a PPI.  I have suggested a trial of Carafate and to return back to GI.  Of note stool was tested today for blood with Hemoccult and it was negative.

## 2021-09-07 NOTE — Progress Notes (Unsigned)
Cardiology Office Note    Date:  09/08/2021   ID:  Shawn Lam, DOB 27-Nov-1950, MRN [id number]  PCP:  Shawn Sanders, MD  Cardiologist:  Shawn Rogue, MD  Electrophysiologist:  None   Chief Complaint: Follow-up  History of Present Illness:   Shawn Lam is a 71 y.o. male with history of CAD status post PCI to the LAD and RCA, recurrent syncope secondary felt to be secondary to orthostatic hypotension, frequent PVCs, HLD, mildly dilated aortic root, CKD stage III, iron deficiency anemia, hypothyroidism, OSA status post Inspire device, and lymphedema who presents for follow-up of Zio patch and echo.   Cardiac MRI in 10/2013 demonstrated normal LVEF and RVEF with no evidence of LGE.   He underwent loop recorder implantation in 05/2016 with device interrogations showing no evidence of significant arrhythmia, prolonged pauses, or high-grade AV block.   Lexiscan MPI in 11/2018 showed no evidence of ischemia and was overall low risk with an EF of 55 to 65%.  Most recent echo from 11/2018 demonstrated an EF of 60 to 51%, diastolic dysfunction, no regional wall motion abnormalities, normal RV systolic function and ventricular cavity size, normal PASP, aorta appeared to be normal, and no significant valvular abnormalities.   He was seen in the office in 04/2021 and noted a syncopal episode around Christmas time in 2022 associated with standing.  He also reported some palpitations when laying down at night.  He remained on midodrine 10 mg 3 times daily.  He was seen in the office on 08/02/2021 for evaluation of recurrent syncope and preoperative cardiac risk ratification for TURP.  Symptoms of syncope continued to be in the context of positional dizziness.  He was without symptoms of angina or decompensation.  Given recurrent syncope, he underwent repeat Zio patch monitoring which demonstrated a predominant rhythm of sinus with an average rate of 68 bpm (range 40 to 193 bpm) 11 episodes of SVT with  the fastest interval lasting 4 beats with a maximum rate of 193 bpm and the longest episode lasting 15 beats, frequent PVCs with a burden of 8.4%.  Given PVC burden, and in the context of significant orthostasis, EP referral was recommended.  Echo demonstrated an EF of 50%, global hypokinesis, normal RV systolic function and ventricular cavity size, normal PASP, mild mitral regurgitation, and an estimated right atrial pressure of 15 mmHg.  PVCs were noted during the study.  He was evaluated by EP on 09/01/2021 for evaluation of frequent PVCs with conservative approach recommended given burden of less than 10%.  He comes in doing reasonably well from a cardiac perspective.  Since he was last seen, he has had 1 further episode of positional syncope.  Overall, his blood pressures do appear to be stable for the most part.  He has tolerated the addition of fludrocortisone without issues.  No symptoms of chest pain or dyspnea.  He does note palpitations in the evening hours.  No significant lower extremity swelling or orthopnea.   Labs independently reviewed: 08/2021 - Hgb 12, PLT 143, potassium 3.7, BUN 16, serum creatinine 1.24 07/2021 - albumin 4.0, AST 41, ALT normal 03/2021 - TC 128, TG 46, HDL 73, LDL 45, TSH normal  Past Medical History:  Diagnosis Date   Actinic keratosis 02/04/2021   right lateral neck   Anemia    Anxiety    Asymptomatic LV dysfunction    50-55% by echo 06/2016   Berger's disease    CAD (coronary artery disease) 06/2005  PCI of LAD and RCA   Depression    Dilated aortic root (HCC)    4cm at sinus of Valsalva   Hematuria    Hyperlipidemia    Hypothyroidism    Insomnia    Left ureteral stone    Orthostatic hypotension    negative tilt table   OSA (obstructive sleep apnea)    moderate with AHI 17/hr, uses CPAP nightly(01/15/19 - has Inspire implant)   PVC's (premature ventricular contractions) 07/07/2016   Renal disorder    PT IS SEEING DR. Lorrene Reid- NEPHROLOGIST    Rosacea    Urticaria     Past Surgical History:  Procedure Laterality Date   CARDIAC CATHETERIZATION  09/06/2013   [facility]   CARDIAC CATHETERIZATION     COLONOSCOPY WITH PROPOFOL N/A 01/21/2019   Procedure: COLONOSCOPY WITH PROPOFOL;  Surgeon: Lucilla Lame, MD;  Location: [place];  Service: Endoscopy;  Laterality: N/A;  sleep apnea   CORONARY ANGIOPLASTY  2004   STENT PLACEMENT   CYSTOSCOPY WITH RETROGRADE PYELOGRAM, URETEROSCOPY AND STENT PLACEMENT Left 03/19/2014   Procedure: CYSTOSCOPY WITH RETROGRADE PYELOGRAM, URETEROSCOPY AND STENT PLACEMENT;  Surgeon: Junious Dresser, MD;  Location: [facility];  Service: Urology;  Laterality: Left;   CYSTOSCOPY WITH RETROGRADE PYELOGRAM, URETEROSCOPY AND STENT PLACEMENT Bilateral 05/20/2015   Procedure:  CYSTOSCOPY WITH BILATERAL RETROGRADE PYELOGRAM,RIGHT  DIAGNOSTIC URETEROSCOPY ,LEFT URETEROSCOPY WITH HOLMIUM LASER  AND BILATERAL  STENT PLACEMENT ;  Surgeon: Alexis Frock, MD;  Location: [facility];  Service: Urology;  Laterality: Bilateral;   DRUG INDUCED ENDOSCOPY N/A 10/20/2017   Procedure: DRUG INDUCED SLEEP ENDOSCOPY;  Surgeon: Melida Quitter, MD;  Location: [place];  Service: ENT;  Laterality: N/A;   EP IMPLANTABLE DEVICE N/A 05/13/2016   Procedure: Loop Recorder Insertion;  Surgeon: Deboraha Sprang, MD;  Location: [place] [facility];  Service: Cardiovascular;  Laterality: N/A;   ESOPHAGOGASTRODUODENOSCOPY (EGD) WITH PROPOFOL N/A 11/30/2018   Procedure: ESOPHAGOGASTRODUODENOSCOPY (EGD) WITH PROPOFOL;  Surgeon: Jonathon Bellows, MD;  Location: [facility] ENDOSCOPY;  Service: Gastroenterology;  Laterality: N/A;   ESOPHAGOGASTRODUODENOSCOPY (EGD) WITH PROPOFOL N/A 12/01/2018   Procedure: ESOPHAGOGASTRODUODENOSCOPY (EGD) WITH PROPOFOL;  Surgeon: Lucilla Lame, MD;  Location: [facility] ENDOSCOPY;  Service: Endoscopy;  Laterality: N/A;   HOLMIUM LASER APPLICATION Bilateral 06/13/3612   Procedure: HOLMIUM LASER APPLICATION;  Surgeon: Alexis Frock, MD;   Location: [facility];  Service: Urology;  Laterality: Bilateral;   IMPLIMENTATION OF A HYPOGLOSSAL NERVE STIMULATOR  12/08/2017   OSA    LEFT HEART CATHETERIZATION WITH CORONARY ANGIOGRAM N/A 09/06/2013   Procedure: LEFT HEART CATHETERIZATION WITH CORONARY ANGIOGRAM;  Surgeon: Sinclair Grooms, MD;  Location: [facility] CATH LAB;  Service: Cardiovascular;  Laterality: N/A;   LEFT KNEE CAP  SURGERY ABOUT 40 YRS AGO  ? 1970     STONE EXTRACTION WITH BASKET Left 03/19/2014   Procedure: STONE EXTRACTION WITH BASKET;  Surgeon: Junious Dresser, MD;  Location: [facility];  Service: Urology;  Laterality: Left;   TRANSURETHRAL RESECTION OF PROSTATE N/A 08/25/2021   Procedure: TRANSURETHRAL RESECTION OF THE PROSTATE (TURP);  Surgeon: Alexis Frock, MD;  Location: [facility];  Service: Urology;  Laterality: N/A;  1 HR    Current Medications: Current Meds  Medication Sig   atorvastatin (LIPITOR) 80 MG tablet Take 1 tablet (80 mg total) by mouth daily.   busPIRone (BUSPAR) 10 MG tablet TAKE 2 TABLETS BY MOUTH 3 TIMES DAILY.   denosumab (PROLIA) 60 MG/ML SOSY injection Inject 60 mg into  the skin every 6 (six) months. Do not send RX, order this from the office   ferrous sulfate 325 (65 FE) MG tablet TAKE 1 TABLET BY MOUTH 3 TIMES DAILY WITH MEALS.   finasteride (PROSCAR) 5 MG tablet Take 5 mg by mouth daily.   fludrocortisone (FLORINEF) 0.1 MG tablet Take 1 tablet (0.1 mg total) by mouth daily.   FLUoxetine (PROZAC) 20 MG capsule TAKE 1 CAPSULE DAILY. TAKE WITH '40MG'$  TO EQUAL '60MG'$     ONCE DAILY   FLUoxetine (PROZAC) 40 MG capsule TAKE 1 CAPSULE EVERY       MORNING   furosemide (LASIX) 20 MG tablet TAKE ONE TABLET BY MOUTH DAILY AS NEEDED FOR SWELLING AND SHORTNESS OF BREATH, TAKE SPARINGLY   gabapentin (NEURONTIN) 100 MG capsule TAKE TWO CAPSULES BY MOUTH TWICE A DAY   levothyroxine (SYNTHROID) 75 MCG tablet TAKE 1 TABLET BY MOUTH EVERY DAY BEFORE BREAKFAST   midodrine (PROAMATINE) 10 MG tablet Take 1 tablet (10 mg total)  by mouth 3 (three) times daily.   mirtazapine (REMERON) 15 MG tablet TAKE 1 TABLET BY MOUTH EVERYDAY AT BEDTIME   Multiple Vitamins-Minerals (MULTIVITAMIN WITH MINERALS) tablet Take 1 tablet by mouth daily.   nitroGLYCERIN (NITROSTAT) 0.4 MG SL tablet Place 1 tablet (0.4 mg total) under the tongue every 5 (five) minutes as needed for chest pain.   pantoprazole (PROTONIX) 40 MG tablet Take 1 tablet (40 mg total) by mouth daily.   sucralfate (CARAFATE) 1 g tablet TAKE 1 TABLET BY MOUTH FOUR TIMES A DAY WITH MEALS AND AT BEDTIME   triamcinolone cream (KENALOG) 0.1 % APPLY 1 APPLICATION TOPICALLY 2 (TWO) TIMES DAILY. AVOID FACE, GROIN, UNDERARMS. (Patient taking differently: Apply 1 application. topically 2 (two) times daily as needed (irritation). Avoid face, groin, underarms.)   zolpidem (AMBIEN) 10 MG tablet TAKE ONE TABLET BY MOUTH EVERY NIGHT AT BEDTIME AS NEEDED    Allergies:   Codeine and Tape   Social History   Socioeconomic History   Marital status: Married    Spouse name: Not on file   Number of children: Not on file   Years of education: Not on file   Highest education level: Not on file  Occupational History   Occupation: retired Print production planner  Tobacco Use   Smoking status: Never   Smokeless tobacco: Never  Vaping Use   Vaping Use: Never used  Substance and Sexual Activity   Alcohol use: Never    Alcohol/week: 1.0 standard drink    Types: 1 Cans of beer per week   Drug use: No   Sexual activity: Yes    Partners: Female    Birth control/protection: None  Other Topics Concern   Not on file  Social History Narrative   Regular exercise--yes--jog 5 miles 6 days a week   Social Determinants of Radio broadcast assistant Strain: Low Risk    Difficulty of Paying Living Expenses: Not hard at all  Food Insecurity: No Food Insecurity   Worried About Charity fundraiser in the Last Year: Never true   Arboriculturist in the Last Year: Never true  Transportation Needs: No  Transportation Needs   Lack of Transportation (Medical): No   Lack of Transportation (Non-Medical): No  Physical Activity: Sufficiently Active   Days of Exercise per Week: 7 days   Minutes of Exercise per Session: 50 min  Stress: No Stress Concern Present   Feeling of Stress : Not at all  Social Connections: Moderately  Isolated   Frequency of Communication with Friends and Family: Twice a week   Frequency of Social Gatherings with Friends and Family: More than three times a week   Attends Religious Services: Never   Marine scientist or Organizations: No   Attends Music therapist: Never   Marital Status: Married     Family History:  The patient's family history includes Cancer in his father; Coronary artery disease in his mother; Diabetes in his brother; Heart attack in his mother; Heart disease in his mother; Hypertension in his mother; Lung cancer in his father.  ROS:   12-point review of systems is negative unless otherwise noted in the HPI.   EKGs/Labs/Other Studies Reviewed:    Studies reviewed were summarized above. The additional studies were reviewed today:  2D echo 08/19/2021: 1. Left ventricular ejection fraction, by estimation, is 50% . The left  ventricle has low normal function. The left ventricle demonstrates global  hypokinesis.   2. Right ventricular systolic function is normal. The right ventricular  size is normal. There is normal pulmonary artery systolic pressure. The  estimated right ventricular systolic pressure is 96.7 mmHg.   3. The mitral valve is normal in structure. Mild mitral valve  regurgitation. No evidence of mitral stenosis.   4. The aortic valve was not well visualized. Aortic valve regurgitation  is not visualized. No aortic stenosis is present.   5. The inferior vena cava is dilated in size with <50% respiratory  variability, suggesting right atrial pressure of 15 mmHg.   6. PVCs noted   Comparison(s): EF  60-65%. __________  Elwyn Reach patch 07/2021: Normal sinus rhythm Patient had a min HR of 40 bpm, max HR of 193 bpm, and avg HR of 68 bpm.    11 Supraventricular Tachycardia runs occurred, the run with the fastest interval lasting 4 beats with a max rate of 193 bpm, the longest lasting 15 beats with an avg rate of 111 bpm.  Junctional Rhythm was present.   IIsolated SVEs were occasional (1.7%, [id number]), SVE Couplets were rare (<1.0%, 72), and SVE Triplets were rare (<1.0%, 11). Isolated VEs were frequent (8.4%, [id number]), VE Couplets were rare (<1.0%, 8), and VE Triplets were rare (<1.0%, 1). Ventricular Bigeminy and Trigeminy were present.   Triggered events associated with PVCs __________  2D echo 11/30/2018: 1. The left ventricle has normal systolic function with an ejection  fraction of 60-65%. The cavity size was normal. Left ventricular diastolic  Doppler parameters are consistent with impaired relaxation. No evidence of  left ventricular regional wall  motion abnormalities.   2. The right ventricle has normal systolic function. The cavity was  normal. There is no increase in right ventricular wall thickness. Right  ventricular systolic pressure is normal with an estimated pressure of 21.3  mmHg. __________   Carlton Adam MPI 11/29/2018: There was no ST segment deviation noted during stress. No T wave inversion was noted during stress. The study is normal. This is a low risk study. The left ventricular ejection fraction is normal (55-65%). __________   2D echo 07/05/2017: - Left ventricle: The cavity size was normal. Wall thickness was    increased in a pattern of mild LVH. Systolic function was normal.    The estimated ejection fraction was in the range of 60% to 65%.    Wall motion was normal; there were no regional wall motion    abnormalities. Doppler parameters are consistent with abnormal    left ventricular relaxation (grade 1  diastolic dysfunction). The    E/e&' ratio is <8,  suggesting normal LV filling pressure.  - Aorta: The sinus of valsalva is dilated at 4.0 cm.  - Left atrium: The atrium was normal in size.  - Right atrium: The atrium was at the upper limits of normal in    size.  - Tricuspid valve: There was trivial regurgitation.  - Pulmonary arteries: PA peak pressure: 22 mm Hg (S).  - Inferior vena cava: The vessel was normal in size. The    respirophasic diameter changes were in the normal range (= 50%),    consistent with normal central venous pressure.   Impressions:   - Compared to a prior study in 06/2016, the LVEF is higher at    60-65%. The aorta is dilated to 4.0 cm at the sinus of valsalva,    but measures 3.5 cm at the aortic root. __________   2D echo 06/24/2016: - Left ventricle: The cavity size was normal. There was mild    concentric hypertrophy. Systolic function was normal. The    estimated ejection fraction was in the range of 50% to 55%. There    is hypokinesis of the entireinferior myocardium. Left ventricular    diastolic function parameters were normal.  - Aortic valve: Trileaflet; mildly thickened, mildly calcified    leaflets.  - Aorta: Aortic root dimension: 38 mm (ED).  - Aortic root: The aortic root was mildly dilated.  - Mitral valve: Calcified annulus as well as calcified papillatry    muscle There was trivial regurgitation.  - Tricuspid valve: There was trivial regurgitation. __________   Outpatient cardiac monitoring 03/2016: Normal SInus Rhythm with the average heart rate 68bpm. Occasional ventricular couplets __________   Nuclear stress test 02/23/2016: Nuclear stress EF: 42%. Blood pressure demonstrated a normal response to exercise. There was no ST segment deviation noted during stress. Defect 1: There is a medium defect of moderate severity present in the basal inferior and mid inferior location. The left ventricular ejection fraction is moderately decreased (30-44%). This is a low risk study.   Low  risk stress nuclear study with inferior thinning vs prior infarct; no ischemia; EF 42 with global hypokinesis and moderate LVE. __________   Cardiac MRI 11/04/2013: IMPRESSION:  1.  Normal LV size and systolic function, EF 10%. 2.  Normal RV size and systolic function. 3. No myocardial delayed enhancement noted, so no definitive  evidence for prior MI, myocarditis, or infiltrative disease. __________   2D echo 10/07/2013: - Left ventricle: The cavity size was normal. Wall thickness was    normal. Systolic function was moderately reduced. The estimated    ejection fraction was in the range of 35% to 40%. Moderate    hypokinesis of the entire myocardium.    EKG:  EKG is ordered today.  The EKG ordered today demonstrates sinus bradycardia, 54 bpm, short PR interval, no delta wave, no significant ST changes, no significant change compared to prior tracing  Recent Labs: 03/19/2021: TSH 1.32 07/30/2021: ALT 30 08/16/2021: Platelets 143 08/26/2021: BUN 16; Creatinine, Ser 1.24; Hemoglobin 12.4; Potassium 3.7; Sodium 140  Recent Lipid Panel    Component Value Date/Time   CHOL 128 03/19/2021 1015   CHOL 145 07/19/2016 0853   TRIG 46.0 03/19/2021 1015   HDL 73.70 03/19/2021 1015   HDL 70 07/19/2016 0853   CHOLHDL 2 03/19/2021 1015   VLDL 9.2 03/19/2021 1015   LDLCALC 45 03/19/2021 1015   LDLCALC 63 07/19/2016 0853   LDLDIRECT  57 06/22/2009 1228    PHYSICAL EXAM:    VS:  BP 120/64 (BP Location: Left Arm, Patient Position: Sitting, Cuff Size: Normal)   Pulse (!) 54   Ht 6' (1.829 m)   Wt 166 lb 8 oz (75.5 kg)   SpO2 98%   BMI 22.58 kg/m   BMI: Body mass index is 22.58 kg/m.  Physical Exam Vitals reviewed.  Constitutional:      Appearance: He is well-developed.  HENT:     Head: Normocephalic and atraumatic.  Eyes:     General:        Right eye: No discharge.        Left eye: No discharge.  Neck:     Vascular: No JVD.  Cardiovascular:     Rate and Rhythm: Normal rate and  regular rhythm.     Pulses:          Posterior tibial pulses are 2+ on the right side and 2+ on the left side.     Heart sounds: Normal heart sounds, S1 normal and S2 normal. Heart sounds not distant. No midsystolic click and no opening snap. No murmur heard.   No friction rub.  Pulmonary:     Effort: Pulmonary effort is normal. No respiratory distress.     Breath sounds: Normal breath sounds. No decreased breath sounds, wheezing or rales.  Chest:     Chest wall: No tenderness.  Abdominal:     General: There is no distension.     Palpations: Abdomen is soft.     Tenderness: There is no abdominal tenderness.  Musculoskeletal:     Cervical back: Normal range of motion.     Right lower leg: No edema.     Left lower leg: No edema.  Skin:    General: Skin is warm and dry.     Nails: There is no clubbing.  Neurological:     Mental Status: He is alert and oriented to person, place, and time.  Psychiatric:        Speech: Speech normal.        Behavior: Behavior normal.        Thought Content: Thought content normal.        Judgment: Judgment normal.    Wt Readings from Last 3 Encounters:  09/08/21 166 lb 8 oz (75.5 kg)  09/01/21 172 lb (78 kg)  08/26/21 181 lb 7 oz (82.3 kg)     ASSESSMENT & PLAN:   Syncope felt to be related to orthostatic hypotension: Prior cardiac work-up has been reassuring including echo, cardiac MRI, and outpatient cardiac monitoring.  Continue current medical therapy including fludrocortisone and midodrine.  Recommend compression stockings and adequate hydration with slow positional changes.  Pursue Lexiscan MPI as outlined below given frequent PVCs.  CAD involving the native coronary arteries without angina: No symptoms of chest pain.  Continue risk factor modification and secondary prevention including clopidogrel and atorvastatin.  Not on a beta-blocker secondary to significant underlying orthostatic hypotension with positional syncope.  PVCs: Unable to  add beta-blocker secondary to significant orthostatic hypotension.  Evaluated by EP with conservative therapy recommended.  Recent echo demonstrated preserved LV systolic function.  Obtain Lexiscan MPI to evaluate for high risk ischemia contributing to ventricular ectopy.  HLD: LDL 45 in 03/2021.  He remains on atorvastatin 80 mg.  CKD stage III: Stable on recent check.  OSA: Status post Inspire device.   Shared Decision Making/Informed Consent{  The risks [chest pain, shortness of breath,  cardiac arrhythmias, dizziness, blood pressure fluctuations, myocardial infarction, stroke/transient ischemic attack, nausea, vomiting, allergic reaction, radiation exposure, metallic taste sensation and life-threatening complications (estimated to be 1 in 10,000)], benefits (risk stratification, diagnosing coronary artery disease, treatment guidance) and alternatives of a nuclear stress test were discussed in detail with Mr. Mcauliffe and he agrees to proceed.     Disposition: F/u with Dr. Rockey Situ or an APP in 3 months.   Medication Adjustments/Labs and Tests Ordered: Current medicines are reviewed at length with the patient today.  Concerns regarding medicines are outlined above. Medication changes, Labs and Tests ordered today are summarized above and listed in the Patient Instructions accessible in Encounters.   Signed, Christell Faith, PA-C 09/08/2021 3:00 PM     [employer] [address] [place], [place] [address] [phone]

## 2021-09-08 ENCOUNTER — Ambulatory Visit (INDEPENDENT_AMBULATORY_CARE_PROVIDER_SITE_OTHER): Payer: Medicare Other | Admitting: Physician Assistant

## 2021-09-08 ENCOUNTER — Ambulatory Visit: Payer: Medicare Other | Admitting: Physician Assistant

## 2021-09-08 ENCOUNTER — Encounter: Payer: Self-pay | Admitting: Physician Assistant

## 2021-09-08 ENCOUNTER — Other Ambulatory Visit: Payer: Self-pay | Admitting: Family Medicine

## 2021-09-08 VITALS — BP 120/64 | HR 54 | Ht 72.0 in | Wt 166.5 lb

## 2021-09-08 DIAGNOSIS — I2510 Atherosclerotic heart disease of native coronary artery without angina pectoris: Secondary | ICD-10-CM

## 2021-09-08 DIAGNOSIS — E785 Hyperlipidemia, unspecified: Secondary | ICD-10-CM | POA: Diagnosis not present

## 2021-09-08 DIAGNOSIS — G4733 Obstructive sleep apnea (adult) (pediatric): Secondary | ICD-10-CM

## 2021-09-08 DIAGNOSIS — I493 Ventricular premature depolarization: Secondary | ICD-10-CM | POA: Diagnosis not present

## 2021-09-08 DIAGNOSIS — N183 Chronic kidney disease, stage 3 unspecified: Secondary | ICD-10-CM | POA: Diagnosis not present

## 2021-09-08 DIAGNOSIS — I951 Orthostatic hypotension: Secondary | ICD-10-CM

## 2021-09-08 NOTE — Patient Instructions (Addendum)
Medication Instructions:  No changes at this time.   *If you need a refill on your cardiac medications before your next appointment, please call your pharmacy*   Lab Work: None  If you have labs (blood work) drawn today and your tests are completely normal, you will receive your results only by: [place] (if you have MyChart) OR A paper copy in the mail If you have any lab test that is abnormal or we need to change your treatment, we will call you to review the results.   Testing/Procedures: [place]  Your caregiver has ordered a Stress Test with nuclear imaging. The purpose of this test is to evaluate the blood supply to your heart muscle. This procedure is referred to as a "Non-Invasive Stress Test." This is because other than having an IV started in your vein, nothing is inserted or "invades" your body. Cardiac stress tests are done to find areas of poor blood flow to the heart by determining the extent of coronary artery disease (CAD). Some patients exercise on a treadmill, which naturally increases the blood flow to your heart, while others who are  unable to walk on a treadmill due to physical limitations have a pharmacologic/chemical stress agent called Lexiscan . This medicine will mimic walking on a treadmill by temporarily increasing your coronary blood flow.   Please note: these test may take anywhere between 2-4 hours to complete  PLEASE REPORT TO [place] AT THE FIRST DESK WILL DIRECT YOU WHERE TO GO  Date of Procedure:___________________  Arrival Time for Procedure:_________________________  Instructions regarding medication:   _XX___ : Hold diabetes medication morning of procedure  _XX___:  Hold Furosemide the morning of procedure. You may take it once you get back home.     PLEASE NOTIFY THE OFFICE AT LEAST 41 HOURS IN ADVANCE IF YOU ARE UNABLE TO KEEP YOUR APPOINTMENT.  [phone] AND  PLEASE NOTIFY NUCLEAR  MEDICINE AT [facility] AT LEAST 24 HOURS IN ADVANCE IF YOU ARE UNABLE TO KEEP YOUR APPOINTMENT. [phone]  How to prepare for your Myoview test:  Do not eat or drink after midnight No caffeine for 24 hours prior to test No smoking 24 hours prior to test. Your medication may be taken with water.  If your doctor stopped a medication because of this test, do not take that medication. Ladies, please do not wear dresses.  Skirts or pants are appropriate. Please wear a short sleeve shirt. No perfume, cologne or lotion. Wear comfortable walking shoes. No heels!   Follow-Up: At [facility], you and your health needs are our priority.  As part of our continuing mission to provide you with exceptional heart care, we have created designated Provider Care Teams.  These Care Teams include your primary Cardiologist (physician) and Advanced Practice Providers (APPs -  Physician Assistants and Nurse Practitioners) who all work together to provide you with the care you need, when you need it.   Your next appointment:   3 month(s)  The format for your next appointment:   In Person  Provider:   Ida Rogue, MD or Christell Faith, PA-C       Important Information About Sugar

## 2021-09-08 NOTE — Telephone Encounter (Signed)
Last office visit 07/30/21 for stomach issues.  Last refilled 07/13/2021 for #120 with 1 refill.  CPE 12.19.23.

## 2021-09-09 ENCOUNTER — Other Ambulatory Visit: Payer: Self-pay | Admitting: Family Medicine

## 2021-09-09 ENCOUNTER — Ambulatory Visit (INDEPENDENT_AMBULATORY_CARE_PROVIDER_SITE_OTHER): Payer: Medicare Other | Admitting: Gastroenterology

## 2021-09-09 ENCOUNTER — Encounter: Payer: Self-pay | Admitting: Gastroenterology

## 2021-09-09 VITALS — BP 135/81 | HR 59 | Temp 97.9°F | Ht 72.0 in | Wt 166.0 lb

## 2021-09-09 DIAGNOSIS — I2510 Atherosclerotic heart disease of native coronary artery without angina pectoris: Secondary | ICD-10-CM | POA: Diagnosis not present

## 2021-09-09 DIAGNOSIS — R140 Abdominal distension (gaseous): Secondary | ICD-10-CM | POA: Diagnosis not present

## 2021-09-09 DIAGNOSIS — R1310 Dysphagia, unspecified: Secondary | ICD-10-CM

## 2021-09-09 DIAGNOSIS — R634 Abnormal weight loss: Secondary | ICD-10-CM | POA: Diagnosis not present

## 2021-09-09 MED ORDER — NA SULFATE-K SULFATE-MG SULF 17.5-3.13-1.6 GM/177ML PO SOLN
1.0000 | Freq: Once | ORAL | 0 refills | Status: AC
Start: 2021-09-09 — End: 2021-09-09

## 2021-09-09 NOTE — Progress Notes (Signed)
Primary Care Physician: Jinny Sanders, MD  Primary Gastroenterologist:  Dr. Lucilla Lame  Chief Complaint  Patient presents with   Abdominal Pain    Intermittent constipation... Denies N/V/D   Dysphagia    HPI: Shawn Lam is a 71 y.o. male here who was previously seen by me back in 2020.  The patient was in the hospital with hematemesis and had a upper endoscopy that did not show any source for the hematemesis.  Subsequent to that the patient had a colonoscopy with a indication of history of colon polyps.  At that time there were no polyps found.  The patient now comes in with a report of abdominal pain.  The patient was recently in the hospital for prostate issues. The patient reports that last month he had trouble swallowing and then he took carafate and the symptoms went away. He does not think the medication helped. He says the symptoms come and go. Worse with sold food ut hurts with liquids also. He also reports that he has also had bloating. He has lost 10 lbs in the last 5 months without trying. He says he eats less because he does not want to feel bad.  Past Medical History:  Diagnosis Date   Actinic keratosis 02/04/2021   right lateral neck   Anemia    Anxiety    Asymptomatic LV dysfunction    50-55% by echo 06/2016   Berger's disease    CAD (coronary artery disease) 06/2005   PCI of LAD and RCA   Depression    Dilated aortic root (HCC)    4cm at sinus of Valsalva   Hematuria    Hyperlipidemia    Hypothyroidism    Insomnia    Left ureteral stone    Orthostatic hypotension    negative tilt table   OSA (obstructive sleep apnea)    moderate with AHI 17/hr, uses CPAP nightly(01/15/19 - has Inspire implant)   PVC's (premature ventricular contractions) 07/07/2016   Renal disorder    PT IS SEEING DR. Lorrene Reid- NEPHROLOGIST   Rosacea    Urticaria     Current Outpatient Medications  Medication Sig Dispense Refill   atorvastatin (LIPITOR) 80 MG tablet Take 1  tablet (80 mg total) by mouth daily. 90 tablet 3   busPIRone (BUSPAR) 10 MG tablet TAKE 2 TABLETS BY MOUTH 3 TIMES DAILY. 540 tablet 1   denosumab (PROLIA) 60 MG/ML SOSY injection Inject 60 mg into the skin every 6 (six) months. Do not send RX, order this from the office     ferrous sulfate 325 (65 FE) MG tablet TAKE 1 TABLET BY MOUTH 3 TIMES DAILY WITH MEALS. 270 tablet 3   finasteride (PROSCAR) 5 MG tablet Take 5 mg by mouth daily.     fludrocortisone (FLORINEF) 0.1 MG tablet Take 1 tablet (0.1 mg total) by mouth daily. 90 tablet 3   FLUoxetine (PROZAC) 20 MG capsule TAKE 1 CAPSULE DAILY. TAKE WITH '40MG'$  TO EQUAL '60MG'$     ONCE DAILY 90 capsule 1   FLUoxetine (PROZAC) 40 MG capsule TAKE 1 CAPSULE EVERY       MORNING 90 capsule 1   furosemide (LASIX) 20 MG tablet TAKE ONE TABLET BY MOUTH DAILY AS NEEDED FOR SWELLING AND SHORTNESS OF BREATH, TAKE SPARINGLY 30 tablet 0   gabapentin (NEURONTIN) 100 MG capsule TAKE TWO CAPSULES BY MOUTH TWICE A DAY 120 capsule 1   levothyroxine (SYNTHROID) 75 MCG tablet TAKE 1 TABLET BY MOUTH EVERY DAY  BEFORE BREAKFAST 90 tablet 3   midodrine (PROAMATINE) 10 MG tablet Take 1 tablet (10 mg total) by mouth 3 (three) times daily. 270 tablet 3   mirtazapine (REMERON) 15 MG tablet TAKE 1 TABLET BY MOUTH EVERYDAY AT BEDTIME 90 tablet 1   Multiple Vitamins-Minerals (MULTIVITAMIN WITH MINERALS) tablet Take 1 tablet by mouth daily.     nitroGLYCERIN (NITROSTAT) 0.4 MG SL tablet Place 1 tablet (0.4 mg total) under the tongue every 5 (five) minutes as needed for chest pain. 10 tablet 1   pantoprazole (PROTONIX) 40 MG tablet Take 1 tablet (40 mg total) by mouth 2 (two) times daily. 180 tablet 0   sucralfate (CARAFATE) 1 g tablet TAKE 1 TABLET BY MOUTH FOUR TIMES A DAY WITH MEALS AND AT BEDTIME 40 tablet 1   triamcinolone cream (KENALOG) 0.1 % APPLY 1 APPLICATION TOPICALLY 2 (TWO) TIMES DAILY. AVOID FACE, GROIN, UNDERARMS. (Patient taking differently: Apply 1 application. topically 2  (two) times daily as needed (irritation). Avoid face, groin, underarms.) 454 g 0   zolpidem (AMBIEN) 10 MG tablet TAKE ONE TABLET BY MOUTH EVERY NIGHT AT BEDTIME AS NEEDED 30 tablet 0   No current facility-administered medications for this visit.    Allergies as of 09/09/2021 - Review Complete 09/09/2021  Allergen Reaction Noted   Codeine Nausea Only 06/05/2007   Tape Other (See Comments) 03/19/2014    ROS:  General: Negative for anorexia, weight loss, fever, chills, fatigue, weakness. ENT: Negative for hoarseness, difficulty swallowing , nasal congestion. CV: Negative for chest pain, angina, palpitations, dyspnea on exertion, peripheral edema.  Respiratory: Negative for dyspnea at rest, dyspnea on exertion, cough, sputum, wheezing.  GI: See history of present illness. GU:  Negative for dysuria, hematuria, urinary incontinence, urinary frequency, nocturnal urination.  Endo: Negative for unusual weight change.    Physical Examination:   BP 135/81   Pulse (!) 59   Temp 97.9 F (36.6 C) (Oral)   Ht 6' (1.829 m)   Wt 166 lb (75.3 kg)   BMI 22.51 kg/m   General: Well-nourished, well-developed in no acute distress.  Eyes: No icterus. Conjunctivae pink. Lungs: Clear to auscultation bilaterally. Non-labored. Heart: Regular rate and rhythm, no murmurs rubs or gallops.  Abdomen: Bowel sounds are normal, nontender, nondistended, no hepatosplenomegaly or masses, no abdominal bruits or hernia , no rebound or guarding.   Extremities: No lower extremity edema. No clubbing or deformities. Neuro: Alert and oriented x 3.  Grossly intact. Skin: Warm and dry, no jaundice.   Psych: Alert and cooperative, normal mood and affect.  Labs:    Imaging Studies: ECHOCARDIOGRAM COMPLETE  Result Date: 08/19/2021    ECHOCARDIOGRAM REPORT   Patient Name:   Shawn Lam Date of Exam: 08/19/2021 Medical Rec #:  [id number]       Height:       72.0 in Accession #:    [id number]      Weight:       172.1  lb Date of Birth:  12/10/1950       BSA:          1.999 m Patient Age:    62 years        BP:           120/74 mmHg Patient Gender: M               HR:           61 bpm. Exam Location:  [facility] Procedure: 2D Echo, Cardiac Doppler  and Color Doppler Indications:     I25.118 Coronary artery disease of native artery of native                  heart with stable angina pectoris [facility])  History:         Patient has prior history of Echocardiogram examinations, most                  recent 11/10/2018. CAD, Arrythmias:PVC; Risk Factors:Sleep Apnea,                  Non-Smoker and Dyslipidemia.  Sonographer:     Wilkie Aye RVT Referring Phys:  [id number] [place] Diagnosing Phys: Ida Rogue MD  Sonographer Comments: Suboptimal parasternal window. IMPRESSIONS  1. Left ventricular ejection fraction, by estimation, is 50% . The left ventricle has low normal function. The left ventricle demonstrates global hypokinesis.  2. Right ventricular systolic function is normal. The right ventricular size is normal. There is normal pulmonary artery systolic pressure. The estimated right ventricular systolic pressure is 08.6 mmHg.  3. The mitral valve is normal in structure. Mild mitral valve regurgitation. No evidence of mitral stenosis.  4. The aortic valve was not well visualized. Aortic valve regurgitation is not visualized. No aortic stenosis is present.  5. The inferior vena cava is dilated in size with <50% respiratory variability, suggesting right atrial pressure of 15 mmHg.  6. PVCs noted Comparison(s): EF 60-65%. FINDINGS  Left Ventricle: Left ventricular ejection fraction, by estimation, is 50 %. The left ventricle has low normal function. The left ventricle demonstrates global hypokinesis. The left ventricular internal cavity size was normal in size. There is no left ventricular hypertrophy. Left ventricular diastolic parameters are indeterminate. Right Ventricle: The right ventricular size is normal. No increase in right  ventricular wall thickness. Right ventricular systolic function is normal. There is normal pulmonary artery systolic pressure. The tricuspid regurgitant velocity is 2.25 m/s, and  with an assumed right atrial pressure of 15 mmHg, the estimated right ventricular systolic pressure is 57.8 mmHg. Left Atrium: Left atrial size was normal in size. Right Atrium: Right atrial size was normal in size. Pericardium: There is no evidence of pericardial effusion. Mitral Valve: The mitral valve is normal in structure. Mild mitral valve regurgitation. No evidence of mitral valve stenosis. Tricuspid Valve: The tricuspid valve is normal in structure. Tricuspid valve regurgitation is mild . No evidence of tricuspid stenosis. Aortic Valve: The aortic valve was not well visualized. Aortic valve regurgitation is not visualized. No aortic stenosis is present. Aortic valve mean gradient measures 2.0 mmHg. Aortic valve peak gradient measures 3.8 mmHg. Aortic valve area, by VTI measures 3.20 cm. Pulmonic Valve: The pulmonic valve was normal in structure. Pulmonic valve regurgitation is not visualized. No evidence of pulmonic stenosis. Aorta: The aortic root is normal in size and structure. Venous: The inferior vena cava is dilated in size with less than 50% respiratory variability, suggesting right atrial pressure of 15 mmHg. IAS/Shunts: No atrial level shunt detected by color flow Doppler.  LEFT VENTRICLE PLAX 2D LVIDd:         5.70 cm      Diastology LVIDs:         4.00 cm      LV e' medial:    9.57 cm/s LV PW:         0.80 cm      LV E/e' medial:  10.0 LV IVS:  0.70 cm      LV e' lateral:   10.40 cm/s LVOT diam:     2.00 cm      LV E/e' lateral: 9.2 LV SV:         65 LV SV Index:   33 LVOT Area:     3.14 cm  LV Volumes (MOD) LV vol d, MOD A2C: 164.0 ml LV vol s, MOD A2C: 74.5 ml LV SV MOD A2C:     89.5 ml RIGHT VENTRICLE             IVC RV Basal diam:  4.80 cm     IVC diam: 2.80 cm RV Mid diam:    3.30 cm RV S prime:     17.20  cm/s TAPSE (M-mode): 2.4 cm LEFT ATRIUM             Index        RIGHT ATRIUM           Index LA diam:        3.40 cm 1.70 cm/m   RA Area:     18.60 cm LA Vol (A2C):   98.6 ml 49.33 ml/m  RA Volume:   56.40 ml  28.22 ml/m LA Vol (A4C):   43.3 ml 21.66 ml/m LA Biplane Vol: 70.4 ml 35.22 ml/m  AORTIC VALVE AV Area (Vmax):    3.23 cm AV Area (Vmean):   3.09 cm AV Area (VTI):     3.20 cm AV Vmax:           97.40 cm/s AV Vmean:          70.000 cm/s AV VTI:            0.204 m AV Peak Grad:      3.8 mmHg AV Mean Grad:      2.0 mmHg LVOT Vmax:         100.20 cm/s LVOT Vmean:        68.750 cm/s LVOT VTI:          0.208 m LVOT/AV VTI ratio: 1.02  AORTA Ao Root diam: 3.70 cm Ao Asc diam:  3.50 cm Ao Arch diam: 3.0 cm MITRAL VALVE               TRICUSPID VALVE MV Area (PHT): 3.66 cm    TR Peak grad:   20.2 mmHg MV Decel Time: 207 msec    TR Vmax:        225.00 cm/s MV E velocity: 95.50 cm/s MV A velocity: 78.60 cm/s  SHUNTS MV E/A ratio:  1.22        Systemic VTI:  0.21 m                            Systemic Diam: 2.00 cm Ida Rogue MD Electronically signed by Ida Rogue MD Signature Date/Time: 08/19/2021/4:33:23 PM    Final (Updated)    LONG TERM MONITOR (3-14 DAYS)  Result Date: 08/29/2021 Event Monitor Patch Wear Time:  12 days and 22 hours (2023-04-27T10:57:15-0400 to 2023-05-10T09:43:00-0400) Normal sinus rhythm Patient had a min HR of 40 bpm, max HR of 193 bpm, and avg HR of 68 bpm. 11 Supraventricular Tachycardia runs occurred, the run with the fastest interval lasting 4 beats with a max rate of 193 bpm, the longest lasting 15 beats with an avg rate of 111 bpm. Junctional Rhythm was present. IIsolated SVEs were occasional (1.7%, [id number]), SVE Couplets were rare (<  1.0%, 72), and SVE Triplets were rare (<1.0%, 11). Isolated VEs were frequent (8.4%, [id number]), VE Couplets were rare (<1.0%, 8), and VE Triplets were rare (<1.0%, 1). Ventricular Bigeminy and Trigeminy were present. Triggered events associated  with PVCs Signed, Esmond Plants, MD, Ph.D [facility]    Assessment and Plan:   Shawn Lam is a 71 y.o. y/o male who comes in with bloating and dysphagia with a history of colon polyps and a 10 pound weight loss.  Due to the patient's multiple GI symptoms and worry symptoms of weight loss and dysphagia, the patient will be set up for an EGD and colonoscopy to rule out any obstructive lesions.  The patient has been explained the plan and agrees with it.  He will follow-up at the time of the procedures.     Lucilla Lame, MD. Marval Regal    Note: This dictation was prepared with Dragon dictation along with smaller phrase technology. Any transcriptional errors that result from this process are unintentional.

## 2021-09-09 NOTE — Addendum Note (Signed)
Addended by: Lurlean Nanny on: 09/09/2021 02:57 PM   Modules accepted: Orders

## 2021-09-09 NOTE — Addendum Note (Signed)
Addended by: Lurlean Nanny on: 09/09/2021 03:37 PM   Modules accepted: Orders

## 2021-09-13 ENCOUNTER — Telehealth: Payer: Self-pay

## 2021-09-13 NOTE — Progress Notes (Signed)
    Chronic Care Management Pharmacy Assistant   Name: Shawn Lam  MRN: [id number] DOB: 05-14-1950  Reason for Encounter: CCM (Appointment Reminder)  Medications: Outpatient Encounter Medications as of 09/13/2021  Medication Sig   atorvastatin (LIPITOR) 80 MG tablet Take 1 tablet (80 mg total) by mouth daily.   busPIRone (BUSPAR) 10 MG tablet TAKE 2 TABLETS BY MOUTH 3 TIMES DAILY.   denosumab (PROLIA) 60 MG/ML SOSY injection Inject 60 mg into the skin every 6 (six) months. Do not send RX, order this from the office   ferrous sulfate 325 (65 FE) MG tablet TAKE 1 TABLET BY MOUTH 3 TIMES DAILY WITH MEALS.   finasteride (PROSCAR) 5 MG tablet Take 5 mg by mouth daily.   fludrocortisone (FLORINEF) 0.1 MG tablet Take 1 tablet (0.1 mg total) by mouth daily.   FLUoxetine (PROZAC) 20 MG capsule TAKE 1 CAPSULE DAILY. TAKE WITH '40MG'$  TO EQUAL '60MG'$     ONCE DAILY   FLUoxetine (PROZAC) 40 MG capsule TAKE 1 CAPSULE EVERY       MORNING   furosemide (LASIX) 20 MG tablet TAKE ONE TABLET BY MOUTH DAILY AS NEEDED FOR SWELLING AND SHORTNESS OF BREATH, TAKE SPARINGLY   gabapentin (NEURONTIN) 100 MG capsule TAKE TWO CAPSULES BY MOUTH TWICE A DAY   levothyroxine (SYNTHROID) 75 MCG tablet TAKE 1 TABLET BY MOUTH EVERY DAY BEFORE BREAKFAST   midodrine (PROAMATINE) 10 MG tablet Take 1 tablet (10 mg total) by mouth 3 (three) times daily.   mirtazapine (REMERON) 15 MG tablet TAKE 1 TABLET BY MOUTH EVERYDAY AT BEDTIME   Multiple Vitamins-Minerals (MULTIVITAMIN WITH MINERALS) tablet Take 1 tablet by mouth daily.   nitroGLYCERIN (NITROSTAT) 0.4 MG SL tablet Place 1 tablet (0.4 mg total) under the tongue every 5 (five) minutes as needed for chest pain.   pantoprazole (PROTONIX) 40 MG tablet Take 1 tablet (40 mg total) by mouth 2 (two) times daily.   sucralfate (CARAFATE) 1 g tablet TAKE 1 TABLET BY MOUTH FOUR TIMES A DAY WITH MEALS AND AT BEDTIME   triamcinolone cream (KENALOG) 0.1 % APPLY 1 APPLICATION TOPICALLY 2 (TWO)  TIMES DAILY. AVOID FACE, GROIN, UNDERARMS. (Patient taking differently: Apply 1 application. topically 2 (two) times daily as needed (irritation). Avoid face, groin, underarms.)   zolpidem (AMBIEN) 10 MG tablet TAKE ONE TABLET BY MOUTH EVERY NIGHT AT BEDTIME AS NEEDED   No facility-administered encounter medications on file as of 09/13/2021.   [name] was contacted to remind of upcoming telephone visit with Charlene Brooke on 09/16/2021 at 11:00. Patient was reminded to have any blood glucose and blood pressure readings available for review at appointment.   Patient confirmed appointment.  CCM referral has been placed prior to visit?  No   Star Rating Drugs: Medication:  Last Fill: Day Supply Atorvastatin 80 mg 08/02/2021 [place], CPP notified  Marijean Niemann, [place] Pharmacy Assistant [phone]

## 2021-09-15 DIAGNOSIS — G4733 Obstructive sleep apnea (adult) (pediatric): Secondary | ICD-10-CM | POA: Diagnosis not present

## 2021-09-16 ENCOUNTER — Telehealth: Payer: Self-pay | Admitting: Pharmacist

## 2021-09-16 ENCOUNTER — Ambulatory Visit: Payer: Medicare Other | Admitting: Pharmacist

## 2021-09-16 DIAGNOSIS — I951 Orthostatic hypotension: Secondary | ICD-10-CM

## 2021-09-16 DIAGNOSIS — Z8719 Personal history of other diseases of the digestive system: Secondary | ICD-10-CM

## 2021-09-16 DIAGNOSIS — F411 Generalized anxiety disorder: Secondary | ICD-10-CM

## 2021-09-16 DIAGNOSIS — M810 Age-related osteoporosis without current pathological fracture: Secondary | ICD-10-CM

## 2021-09-16 DIAGNOSIS — E785 Hyperlipidemia, unspecified: Secondary | ICD-10-CM

## 2021-09-16 DIAGNOSIS — I25118 Atherosclerotic heart disease of native coronary artery with other forms of angina pectoris: Secondary | ICD-10-CM

## 2021-09-16 DIAGNOSIS — F32A Depression, unspecified: Secondary | ICD-10-CM

## 2021-09-16 MED ORDER — FLUOXETINE HCL 20 MG PO CAPS: ORAL_CAPSULE | ORAL | 0 refills | Status: DC

## 2021-09-16 MED ORDER — FLUOXETINE HCL 40 MG PO CAPS: 40.0000 mg | ORAL_CAPSULE | Freq: Every morning | ORAL | 0 refills | Status: DC

## 2021-09-16 NOTE — Progress Notes (Addendum)
Chronic Care Management Pharmacy Note  09/16/2021 Name:  Shawn Lam MRN:  [id number] DOB:  [birth year]  Summary: CCM F/U visit -Reviewed medications; pt affirms compliance -Pt reports midodrine is cost-prohibitive (>$100) so he uses a coupon; per insurance formulary, 10 mg dose is Tier 4 but 5 mg dose is Tier 2 - he may qualify for tier exception -Pt would like all of his medications consolidated with [facility] (clopidogrel and fluoxetine come through mail order currently)  Recommendations/Changes made from today's visit: -Pursue tier exception for midodrine 10 mg; if denied, can switch to 5 mg tablets (take 2 tab TID) for cost savings -Updated preferred pharmacy to [place]: -[facility] will call patient with tier exception decision -Pharmacist follow up televisit scheduled for 1 year -PCP CPE 03/29/22   Subjective: Shawn Lam is an 71 y.o. year old male who is a primary patient of Bedsole, Amy E, MD.  The CCM team was consulted for assistance with disease management and care coordination needs.    Engaged with patient by telephone for follow up visit in response to provider referral for pharmacy case management and/or care coordination services.   Consent to Services:  The patient was given information about Chronic Care Management services, agreed to services, and gave verbal consent prior to initiation of services.  Please see initial visit note for detailed documentation.   Patient Care Team: Jinny Sanders, MD as PCP - General Rockey Situ Kathlene November, MD as PCP - Cardiology (Cardiology) Charlton Haws, [facility] as Pharmacist (Pharmacist)  Recent office visits: 07/30/21 Dr Diona Browner OV: stomach issues, fainting - Rx sucralfate. F/u with cardiology. F/U with GI. Increase pantoprazole to BID.  06/10/21 Dr Diona Browner OV: pain in throat - most likely GERD. Start pantoprazole 40 mg daily. Consider thyroid US if no improvement.  03/26/21 Dr Diona Browner  OV: annual exam - consider changing fluoxetine to cymbalta for chronic pain issues.  02/26/21 LPN - AWV 01/01/29 Dr Diona Browner VV: acute cough. Rx'd z-pak and Hycodan syrup. Covid negative. 04/16/20 Dr Diona Browner OV: f/u osteoporosis. Start Prolia injections. Repeat DEXA 1-2 years.  Recent consult visits: 09/09/21 Dr Allen Norris (GI): bloating. Ordered EGD and colonoscopy. 09/08/21 PA Christell Faith (Cardiology): orthostasis - ordered Lexiscan. 09/01/21 Dr Quentin Ore (cardiology): PVCs - unable to tolerate BB or CCB. Favor conservative mgmt.  08/02/21 PA Christell Faith (cardiology): orthostasis. Rx'd Fludrocortisone 0.1 mg daily. Ordered heart monitor and ECHO. ECHO WNL, heart monitor shows PVC and rec referral to EP as beta-blocker cannot be used.  04/28/21 Jomarie Longs (optometry) - eye exam 04/27/21 Dr Rockey Situ (Cardiology): f/u CAD, orthostasis; no med changes. Use furosemide sparingly  03/02/21 NP Peri Jefferson (Pulmonology): f/u OSA. On Inspire therapy. Repeat home sleep study. F/U 6 months 02/04/21 Dr Nehemiah Massed (Dermatology): f/u neoplasm on neck. Rx'd fluorouracil 5% cream x 7 days. 09/17/20 Dr Nehemiah Massed (Dermatology): f/u Basal cell carcinoma. 04/24/20 Dr Rockey Situ (cardiology): f/u CAD; rec'd lasix sparingly; continue midodrine  Hospital visits: 10/31/20 ED visit - L hand laceration. Left before being seen.   Objective:  Lab Results  Component Value Date   CREATININE 1.24 08/26/2021   BUN 16 08/26/2021   GFR 43.20 (L) 07/30/2021   GFRNONAA >60 08/26/2021   GFRAA 60 (L) 01/08/2020   NA 140 08/26/2021   K 3.7 08/26/2021   CALCIUM 8.1 (L) 08/26/2021   CO2 28 08/26/2021   GLUCOSE 128 (H) 08/26/2021    Lab Results  Component Value Date/Time   HGBA1C 5.1 08/11/2016  08:59 AM   HGBA1C 5.5 02/02/2016 07:52 AM   GFR 43.20 (L) 07/30/2021 12:55 PM   GFR 39.09 (L) 06/25/2021 11:22 AM   MICROALBUR 0.5 06/18/2008 10:37 AM    Last diabetic Eye exam: No results found for: "HMDIABEYEEXA"  Last diabetic Foot exam: No  results found for: "HMDIABFOOTEX"   Lab Results  Component Value Date   CHOL 128 03/19/2021   HDL 73.70 03/19/2021   LDLCALC 45 03/19/2021   LDLDIRECT 57 06/22/2009   TRIG 46.0 03/19/2021   CHOLHDL 2 03/19/2021       Latest Ref Rng & Units 07/30/2021   12:55 PM 03/19/2021   10:15 AM 03/12/2020    9:57 AM  Hepatic Function  Total Protein 6.0 - 8.3 g/dL 5.9  6.0  6.3   Albumin 3.5 - 5.2 g/dL 4.0  3.7  3.8   AST 0 - 37 U/L 41  32  36   ALT 0 - 53 U/L _0 Alk Phosphatase 39 - 117 U/L 66  73  75   Total Bilirubin 0.2 - 1.2 mg/dL 0.6  0.7  0.7     Lab Results  Component Value Date/Time   TSH 1.32 03/19/2021 10:15 AM   TSH 2.000 01/23/2020 12:08 PM   FREET4 0.83 03/19/2021 10:15 AM   FREET4 1.04 07/23/2019 01:27 PM       Latest Ref Rng & Units 08/26/2021    5:05 AM 08/25/2021   12:08 PM 08/16/2021    2:08 PM  CBC  WBC 4.0 - 10.5 K/uL   5.3   Hemoglobin 13.0 - 17.0 g/dL 12.4  12.0  12.8   Hematocrit 39.0 - 52.0 % 38.2  37.1  38.2   Platelets 150 - 400 K/uL   143    Iron/TIBC/Ferritin/ %Sat    Component Value Date/Time   IRON 98 07/30/2021 1255   TIBC 268.8 07/30/2021 1255   FERRITIN 202.8 07/30/2021 1255   IRONPCTSAT 36.5 07/30/2021 1255    Lab Results  Component Value Date/Time   VD25OH 38.45 02/13/2018 02:12 PM    Clinical ASCVD: Yes  The ASCVD Risk score (Arnett DK, et al., 2019) failed to calculate for the following reasons:   The valid total cholesterol range is 130 to 320 mg/dL       07/30/2021   11:37 AM 07/30/2021   11:31 AM 02/26/2021   11:46 AM  Depression screen PHQ 2/9  Decreased Interest 0 0 0  Down, Depressed, Hopeless 0 0 0  PHQ - 2 Score 0 0 0  Altered sleeping 0 0   Tired, decreased energy 0 0   Change in appetite 0 0   Feeling bad or failure about yourself  0 0   Trouble concentrating 0 0   Moving slowly or fidgety/restless 0 0   Suicidal thoughts 0 0   PHQ-9 Score 0 0   Difficult doing work/chores Not difficult at all Not  difficult at all        07/30/2021   11:37 AM 07/30/2021   11:32 AM 08/22/2019   11:01 AM  GAD 7 : Generalized Anxiety Score  Nervous, Anxious, on Edge 0 0 2  Control/stop worrying 0 0 2  Worry too much - different things 0 0 1  Trouble relaxing 0 0 3  Restless 0 0 3  Easily annoyed or irritable 0 0 2  Afraid - awful might happen 0 0 0  Total GAD 7 Score 0 0 13  Anxiety Difficulty Not difficult at all Not difficult at all Somewhat difficult    Social History   Tobacco Use  Smoking Status Never  Smokeless Tobacco Never   BP Readings from Last 3 Encounters:  09/09/21 135/81  09/08/21 120/64  09/01/21 128/68   Pulse Readings from Last 3 Encounters:  09/09/21 (!) 59  09/08/21 (!) 54  09/01/21 60   Wt Readings from Last 3 Encounters:  09/09/21 166 lb (75.3 kg)  09/08/21 166 lb 8 oz (75.5 kg)  09/01/21 172 lb (78 kg)   BMI Readings from Last 3 Encounters:  09/09/21 22.51 kg/m  09/08/21 22.58 kg/m  09/01/21 23.33 kg/m    Assessment/Interventions: Review of patient past medical history, allergies, medications, health status, including review of consultants reports, laboratory and other test data, was performed as part of comprehensive evaluation and provision of chronic care management services.   SDOH:  (Social Determinants of Health) assessments and interventions performed: Yes  SDOH Screenings   Alcohol Screen: Low Risk  (02/26/2021)   Alcohol Screen    Last Alcohol Screening Score (AUDIT): 0  Depression (PHQ2-9): Low Risk  (07/30/2021)   Depression (PHQ2-9)    PHQ-2 Score: 0  Financial Resource Strain: Low Risk  (02/26/2021)   Overall Financial Resource Strain (CARDIA)    Difficulty of Paying Living Expenses: Not hard at all  Food Insecurity: No Food Insecurity (02/26/2021)   Hunger Vital Sign    Worried About Running Out of Food in the Last Year: Never true    [place] in the Last Year: Never true  Housing: Low Risk  (02/26/2021)   Housing    Last  Housing Risk Score: 0  Physical Activity: Sufficiently Active (02/26/2021)   Exercise Vital Sign    Days of Exercise per Week: 7 days    Minutes of Exercise per Session: 50 min  Social Connections: Moderately Isolated (02/26/2021)   Social Connection and Isolation Panel [NHANES]    Frequency of Communication with Friends and Family: Twice a week    Frequency of Social Gatherings with Friends and Family: More than three times a week    Attends Religious Services: Never    Marine scientist or Organizations: No    Attends Archivist Meetings: Never    Marital Status: Married  Stress: No Stress Concern Present (02/26/2021)   [place]    Feeling of Stress : Not at all  Tobacco Use: Low Risk  (09/09/2021)   Patient History    Smoking Tobacco Use: Never    Smokeless Tobacco Use: Never    Passive Exposure: Not on file  Transportation Needs: No Transportation Needs (02/26/2021)   PRAPARE - Transportation    Lack of Transportation (Medical): No    Lack of Transportation (Non-Medical): No    CCM Care Plan  Allergies  Allergen Reactions   Codeine Nausea Only   Tape Other (See Comments)    Use only paper tape. Severe blistering and bruising with other tapes. No cloth tape.    Medications Reviewed Today     Reviewed by Charlton Haws, [facility] (Pharmacist) on 09/16/21 at 1211  Med List Status: <None>   Medication Order Taking? Sig Documenting Provider Last Dose Status Informant  atorvastatin (LIPITOR) 80 MG tablet [id number] Yes Take 1 tablet (80 mg total) by mouth daily. Jinny Sanders, MD Taking Active Self  busPIRone (BUSPAR) 10 MG tablet [id number] Yes TAKE 2 TABLETS BY MOUTH  3 TIMES DAILY. Jinny Sanders, MD Taking Active Self  clopidogrel (PLAVIX) 75 MG tablet [id number] Yes Take 75 mg by mouth daily. [provider] Taking Active   denosumab (PROLIA) 60 MG/ML SOSY injection [id number] Yes  Inject 60 mg into the skin every 6 (six) months. Do not send RX, order this from the office [provider] Taking Active Self  ferrous sulfate 325 (65 FE) MG tablet [id number] Yes TAKE 1 TABLET BY MOUTH 3 TIMES DAILY WITH MEALS. Jinny Sanders, MD Taking Active Self  finasteride (PROSCAR) 5 MG tablet [id number] Yes Take 5 mg by mouth daily. [provider] Taking Active Self  fludrocortisone (FLORINEF) 0.1 MG tablet [id number] Yes Take 1 tablet (0.1 mg total) by mouth daily. Rise Mu, PA-C Taking Active Self  FLUoxetine (PROZAC) 20 MG capsule [id number]  TAKE 1 CAPSULE DAILY. TAKE WITH 40MG TO EQUAL 60MG Bedsole, Amy E, MD  Active   FLUoxetine (PROZAC) 40 MG capsule [id number]  Take 1 capsule (40 mg total) by mouth every morning. Jinny Sanders, MD  Active   furosemide (LASIX) 20 MG tablet [id number] Yes TAKE ONE TABLET BY MOUTH DAILY AS NEEDED FOR SWELLING AND SHORTNESS OF BREATH, TAKE SPARINGLY Gollan, Kathlene November, MD Taking Active   gabapentin (NEURONTIN) 100 MG capsule [id number] Yes TAKE TWO CAPSULES BY MOUTH TWICE A DAY Bedsole, Amy E, MD Taking Active   levothyroxine (SYNTHROID) 75 MCG tablet [id number] Yes TAKE 1 TABLET BY MOUTH EVERY DAY BEFORE BREAKFAST Bedsole, Amy E, MD Taking Active Self  midodrine (PROAMATINE) 10 MG tablet [id number] Yes Take 1 tablet (10 mg total) by mouth 3 (three) times daily. Minna Merritts, MD Taking Active Self  mirtazapine (REMERON) 15 MG tablet [id number] Yes TAKE 1 TABLET BY MOUTH EVERYDAY AT BEDTIME Bedsole, Amy E, MD Taking Active Self  Multiple Vitamins-Minerals (MULTIVITAMIN WITH MINERALS) tablet [id number] Yes Take 1 tablet by mouth daily. [provider] Taking Active Self  nitroGLYCERIN (NITROSTAT) 0.4 MG SL tablet [id number] Yes Place 1 tablet (0.4 mg total) under the tongue every 5 (five) minutes as needed for chest pain. Jinny Sanders, MD Taking Active Self  pantoprazole (PROTONIX) 40 MG tablet [id number] Yes Take 1 tablet (40 mg  total) by mouth 2 (two) times daily. Jinny Sanders, MD Taking Active   sucralfate (CARAFATE) 1 g tablet [id number] Yes TAKE 1 TABLET BY MOUTH FOUR TIMES A DAY WITH MEALS AND AT BEDTIME Bedsole, Amy E, MD Taking Active   triamcinolone cream (KENALOG) 0.1 % [id number] Yes APPLY 1 APPLICATION TOPICALLY 2 (TWO) TIMES DAILY. AVOID FACE, GROIN, UNDERARMS.  Patient taking differently: Apply 1 application. topically 2 (two) times daily as needed (irritation). Avoid face, groin, underarms.   Jinny Sanders, MD Taking Active Self  zolpidem (AMBIEN) 10 MG tablet [id number] Yes TAKE ONE TABLET BY MOUTH EVERY NIGHT AT BEDTIME AS NEEDED Jinny Sanders, MD Taking Active             Patient Active Problem List   Diagnosis Date Noted   Prostatic hyperplasia 08/25/2021   Foreign body sensation in throat 06/10/2021   Chronic insomnia 03/19/2020   Peripheral neuropathy 03/19/2020   Iron deficiency anemia 01/06/2020   Allergic dermatitis 07/08/2019   Osteoarthritis of left knee 02/26/2019   Personal history of colonic polyps    History of duodenal ulcer    CKD (chronic kidney disease), stage IIIa    Bilateral chronic knee pain 11/09/2018   Chronic pain of right thumb 02/22/2018  Acute bilateral thoracic back pain 01/23/2018   Osteoporosis without current pathological fracture 02/10/2017   Hypothyroidism 11/02/2016   Thoracic compression fracture ([place]) 09/06/2016   PVC's (premature ventricular contractions) 07/07/2016   Hypoalbuminemia 06/28/2016   IgA nephropathy 06/07/2016   Syncope 05/13/2016   Palpitation 03/15/2016   Mild depression 11/20/2015   OSA (obstructive sleep apnea) 02/05/2014   Orthostatic hypotension 02/07/2013   Anemia 01/31/2013   Scotoma involving central area 01/29/2013   Optic neuropathy 01/28/2013   Fatigue 01/20/2011   HEMATURIA UNSPECIFIED 01/21/2010   HLD (hyperlipidemia) 07/05/2006   Generalized anxiety disorder 07/05/2006   CAD (coronary artery disease) 07/05/2006    GERD 07/05/2006    Immunization History  Administered Date(s) Administered   Fluad Quad(high Dose 65+) 12/04/2018, 01/01/2020   Influenza Split 01/04/2011, 01/27/2012   Influenza Whole 01/09/2008, 03/04/2009, 12/27/2009   Influenza, High Dose Seasonal PF 01/04/2021   Influenza,inj,Quad PF,6+ Mos 01/31/2013, 01/31/2014, 02/03/2015, 02/09/2016, 01/20/2017, 01/23/2018   PFIZER Comirnaty(Gray Top)Covid-19 Tri-Sucrose Vaccine 05/04/2020   PFIZER(Purple Top)SARS-COV-2 Vaccination 06/01/2019, 06/25/2019   Pneumococcal Conjugate-13 02/09/2016   Pneumococcal Polysaccharide-23 02/23/2017   Td 06/05/2007   Tdap 03/13/2019   Zoster, Live 01/25/2011    Conditions to be addressed/monitored:  Hyperlipidemia, Coronary Artery Disease, GERD, Chronic Kidney Disease, Depression, Osteoporosis, and BPH, Orthostatic hypotension  Care Plan : [place]  Updates made by Charlton Haws, [place] since 09/16/2021 12:00 AM     Problem: Hyperlipidemia, Coronary Artery Disease, GERD, Chronic Kidney Disease, Depression, Osteoporosis, and BPH, Orthostatic hypotension   Priority: High     Long-Range Goal: Disease mgmt   Start Date: 03/22/2021  Expected End Date: 03/22/2022  This Visit's Progress: On track  Recent Progress: On track  Priority: High  Note:   Current Barriers:  None identified  Pharmacist Clinical Goal(s):  Patient will contact provider office for questions/concerns as evidenced notation of same in electronic health record through collaboration with PharmD and provider.   Interventions: 1:1 collaboration with Jinny Sanders, MD regarding development and update of comprehensive plan of care as evidenced by provider attestation and co-signature Inter-disciplinary care team collaboration (see longitudinal plan of care) Comprehensive medication review performed; medication list updated in electronic medical record  Orthostatic hypotension (BP goal 100/60-130/80) -Controlled - pt  reports improvement since starting fludrocortisone; he reports midodrine is over $100 and he uses a coupon for it instead -OSA with Inspire device -Current treatment: Midodrine 10 mg TID - Appropriate, Effective, Safe, Query Accessible Fludrocortisone 0.1 mg daily - Appropriate, Effective, Safe, Accessible -Educated on Importance of home blood pressure monitoring; -Counseled to monitor BP at home periodically -Reviewed insurance formulary - midodrine 10 mg is Tier 2 but midodrine 5 mg is Tier 2 -Pursue tier exception for midodrine 10 mg. If denied, can change midodrine to 5 mg tabs - take 2 tab TID (for cost savings)  Hyperlipidemia: (LDL goal < 70) -Controlled - LDL 45 (03/2021) at goal; pt affirms adherence -Hx CAD w/ stents -Current treatment: Atorvastatin 80 mg daily - Appropriate, Effective, Safe, Accessible Clopidogrel 75 mg daily  -Appropriate, Effective, Safe, Accessible Nitroglycerin 0.4 mg SL prn - Appropriate, Effective, Safe, Accessible -Educated on Cholesterol goals; Benefits of statin for ASCVD risk reduction; -Recommended to continue current medication  Depression/Anxiety/Insomnia (Goal: manage symptoms) -Controlled - per patient report -PHQ9: 0 -GAD7: 13 (08/2019) -Current treatment: Fluoxetine 20 mg daily - Appropriate, Effective, Safe, Accessible Fluoxetine 40 mg daily - Appropriate, Effective, Safe, Accessible Mirtazapine 15 mg HS - Appropriate, Effective, Safe, Accessible  Buspirone 10 mg - 2 tab TID - Appropriate, Effective, Safe, Accessible Zolpidem 10 mg daily PRN - Appropriate, Effective, Safe, Accessible -Medications previously tried/failed: alprazolam, aripiprazole, doxepin, escitalopram, lithium, lamotrigine, hydroxyzine, sertraline, venlafaxine -Educated on Benefits of medication for symptom control -Recommended to continue current medication  Osteoporosis (Goal prevent fractures) -Controlled -Last DEXA Scan: 04/02/20   T-Score femoral neck:  -2.8  T-Score total hip: -2.2  T-Score lumbar spine: -0.1  T-Score forearm radius: -0.2 -Patient is a candidate for pharmacologic treatment due to T-Score < -2.5 in femoral neck -Current treatment  Prolia (given 06/08/20, 12/08/20, 07/14/21) - Appropriate, Effective, Safe, Accessible -Medications previously tried: Forteo, alendronate  -Recommend weight-bearing and muscle strengthening exercises for building and maintaining bone density. -Recommend to continue current medication; repeat DEXA due 03/2022  GERD (Goal: manage symptoms) -Controlled - pt has hx of duodenal ulcer; pt recently established with GI and is awaiting EGD to evaluate abdominal pain/bloating -Current treatment  Pantoprazole 40 mg BID - Appropriate, Effective, Safe, Accessible Sucralfate 1 gm PRN - Appropriate, Effective, Safe, Accessible -Recommended to continue current medication  BPH (Goal: manage symptoms) -Controlled - per pt report -Follows with urology (Dr Tammi Klippel) -Current treatment  Finasteride 5 mg daily - Appropriate, Effective, Safe, Accessible -Counseled on role of each medication -Recommended to continue current medication  Chronic Kidney Disease Stage 3b  -All medications assessed for renal dosing and appropriateness in chronic kidney disease. -Recommended to continue current medication  Patient Goals/Self-Care Activities Patient will:  - take medications as prescribed as evidenced by patient report and record review focus on medication adherence by routine check blood pressure periodically, document, and provide at future appointments     Medication Assistance: None required.  Patient affirms current coverage meets needs.  Compliance/Adherence/Medication fill history: Care Gaps: None  Star-Rating Drugs: Atorvastatin - PDC 100%  Medication Access: Within the past 30 days, how often has patient missed a dose of medication? 0 Is a pillbox or other method used to improve adherence? Yes   Factors that may affect medication adherence?  Multiple pharmacies Are meds synced by current pharmacy? No  Are meds delivered by current pharmacy? No  Does patient experience delays in picking up medications due to transportation concerns? No   [facility] Services Reviewed: Is patient disadvantaged to use [facility]?: No  Current Rx insurance plan: [place] PDP Name and location of Current pharmacy:  [id number] [address], [place] - [address] [place] [address] Phone: [phone] Fax: [phone]  [facility] [place] to Registered [facility] [address] [place] [address] Phone: [phone] Fax: [phone]  [facility] services reviewed with patient today?: No  Patient requests to transfer care to [facility]?: No  Reason patient declined to change pharmacies: Loyalty to other pharmacy/Patient preference - pt requests all medications switched to [facility] and Follow Up Patient Decision:  Patient agrees to Care Plan and Follow-up.  Plan: Telephone follow up appointment with care management team member scheduled for:  1 year  Charlene Brooke, PharmD, [facility] Clinical Pharmacist [facility] at [facility] [phone]

## 2021-09-16 NOTE — Patient Instructions (Signed)
Visit Information  Phone number for Pharmacist: [phone]   Goals Addressed   None     Care Plan : [place]  Updates made by Charlton Haws, RPH since 09/16/2021 12:00 AM     Problem: Hyperlipidemia, Coronary Artery Disease, GERD, Chronic Kidney Disease, Depression, Osteoporosis, and BPH, Orthostatic hypotension   Priority: High     Long-Range Goal: Disease mgmt   Start Date: 03/22/2021  Expected End Date: 03/22/2022  This Visit's Progress: On track  Recent Progress: On track  Priority: High  Note:   Current Barriers:  None identified  Pharmacist Clinical Goal(s):  Patient will contact provider office for questions/concerns as evidenced notation of same in electronic health record through collaboration with PharmD and provider.   Interventions: 1:1 collaboration with Jinny Sanders, MD regarding development and update of comprehensive plan of care as evidenced by provider attestation and co-signature Inter-disciplinary care team collaboration (see longitudinal plan of care) Comprehensive medication review performed; medication list updated in electronic medical record  Orthostatic hypotension (BP goal 100/60-130/80) -Controlled - pt reports improvement since starting fludrocortisone; he reports midodrine is over $100 and he uses a coupon for it instead -OSA with Inspire device -Current treatment: Midodrine 10 mg TID - Appropriate, Effective, Safe, Query Accessible Fludrocortisone 0.1 mg daily - Appropriate, Effective, Safe, Accessible -Educated on Importance of home blood pressure monitoring; -Counseled to monitor BP at home periodically -Reviewed insurance formulary - midodrine 10 mg is Tier 2 but midodrine 5 mg is Tier 2 -Pursue tier exception for midodrine 10 mg. If denied, can change midodrine to 5 mg tabs - take 2 tab TID (for cost savings)  Hyperlipidemia: (LDL goal < 70) -Controlled - LDL 45 (03/2021) at goal; pt affirms adherence -Hx CAD w/  stents -Current treatment: Atorvastatin 80 mg daily - Appropriate, Effective, Safe, Accessible Clopidogrel 75 mg daily  -Appropriate, Effective, Safe, Accessible Nitroglycerin 0.4 mg SL prn - Appropriate, Effective, Safe, Accessible -Educated on Cholesterol goals; Benefits of statin for ASCVD risk reduction; -Recommended to continue current medication  Depression/Anxiety/Insomnia (Goal: manage symptoms) -Controlled - per patient report -PHQ9: 0 -GAD7: 13 (08/2019) -Current treatment: Fluoxetine 20 mg daily - Appropriate, Effective, Safe, Accessible Fluoxetine 40 mg daily - Appropriate, Effective, Safe, Accessible Mirtazapine 15 mg HS - Appropriate, Effective, Safe, Accessible Buspirone 10 mg - 2 tab TID - Appropriate, Effective, Safe, Accessible Zolpidem 10 mg daily PRN - Appropriate, Effective, Safe, Accessible -Medications previously tried/failed: alprazolam, aripiprazole, doxepin, escitalopram, lithium, lamotrigine, hydroxyzine, sertraline, venlafaxine -Educated on Benefits of medication for symptom control -Recommended to continue current medication  Osteoporosis (Goal prevent fractures) -Controlled -Last DEXA Scan: 04/02/20   T-Score femoral neck: -2.8  T-Score total hip: -2.2  T-Score lumbar spine: -0.1  T-Score forearm radius: -0.2 -Patient is a candidate for pharmacologic treatment due to T-Score < -2.5 in femoral neck -Current treatment  Prolia (given 06/08/20, 12/08/20, 07/14/21) - Appropriate, Effective, Safe, Accessible -Medications previously tried: Forteo, alendronate  -Recommend weight-bearing and muscle strengthening exercises for building and maintaining bone density. -Recommend to continue current medication; repeat DEXA due 03/2022  GERD (Goal: manage symptoms) -Controlled - pt has hx of duodenal ulcer; pt recently established with GI and is awaiting EGD to evaluate abdominal pain/bloating -Current treatment  Pantoprazole 40 mg BID - Appropriate, Effective, Safe,  Accessible Sucralfate 1 gm PRN - Appropriate, Effective, Safe, Accessible -Recommended to continue current medication  BPH (Goal: manage symptoms) -Controlled - per pt report -Follows with urology (Dr Tammi Klippel) -Current treatment  Finasteride  5 mg daily - Appropriate, Effective, Safe, Accessible -Counseled on role of each medication -Recommended to continue current medication  Chronic Kidney Disease Stage 3b  -All medications assessed for renal dosing and appropriateness in chronic kidney disease. -Recommended to continue current medication  Patient Goals/Self-Care Activities Patient will:  - take medications as prescribed as evidenced by patient report and record review focus on medication adherence by routine check blood pressure periodically, document, and provide at future appointments      Patient verbalizes understanding of instructions and care plan provided today and agrees to view in [place]. Active MyChart status and patient understanding of how to access instructions and care plan via MyChart confirmed with patient.    Telephone follow up appointment with pharmacy team member scheduled for: 1 year  Charlene Brooke, PharmD, [facility] Clinical Pharmacist [facility] [phone]

## 2021-09-16 NOTE — Progress Notes (Signed)
Encounter details: CCM Time Spent       Value Time User   Total time (minutes)  0 09/16/2021 12:19 PM Foltanski, Cleaster Corin, [place]       I have personally reviewed this encounter including the documentation in this note and have collaborated with the care management provider regarding care management and care coordination activities to include development and update of the comprehensive care plan. I am certifying that I agree with the content of this note and encounter as supervising physician/advanced practice provider.

## 2021-09-16 NOTE — Telephone Encounter (Signed)
Please advise if ok to refill Clopidogrel 75 mg qd. On pt's current medication list but not on last OV note medication list. Please advise.

## 2021-09-16 NOTE — Telephone Encounter (Signed)
Patient reports he is still taking clopidogrel 75 mg daily. Per chart review this was taken off med list 5/18 following TURP procedure. Recent cardiology note (5/31 - PA Christell Faith) notes to continue clopidogrel for secondary prevention.  Patient would like clopidogrel refill sent to [facility]. Routing to cardiology for clarification and refill.  [facility] [id number], [place] [address] Phone: [phone] Fax: [phone]

## 2021-09-17 ENCOUNTER — Other Ambulatory Visit: Payer: Self-pay | Admitting: Family Medicine

## 2021-09-17 ENCOUNTER — Other Ambulatory Visit: Payer: Self-pay | Admitting: Cardiovascular Disease

## 2021-09-17 ENCOUNTER — Telehealth: Payer: Self-pay

## 2021-09-17 DIAGNOSIS — F32A Depression, unspecified: Secondary | ICD-10-CM

## 2021-09-17 DIAGNOSIS — F411 Generalized anxiety disorder: Secondary | ICD-10-CM

## 2021-09-17 NOTE — Telephone Encounter (Signed)
Error

## 2021-09-20 ENCOUNTER — Ambulatory Visit: Payer: Medicare Other | Admitting: Gastroenterology

## 2021-09-20 NOTE — Telephone Encounter (Signed)
Refill was sent in on 09/17/21. Closing encounter.

## 2021-09-21 ENCOUNTER — Other Ambulatory Visit: Payer: Self-pay | Admitting: Cardiovascular Disease

## 2021-09-21 NOTE — Telephone Encounter (Signed)
Received message from patient - recent clopidogrel refill was sent to mail order, patient requests it be sent to [facility].

## 2021-09-22 ENCOUNTER — Encounter
Admission: RE | Admit: 2021-09-22 | Discharge: 2021-09-22 | Disposition: A | Discharge status: Home / Self Care | Payer: Medicare Other | Source: Ambulatory Visit | Attending: Physician Assistant | Admitting: Physician Assistant

## 2021-09-22 ENCOUNTER — Other Ambulatory Visit: Payer: Self-pay | Admitting: Family Medicine

## 2021-09-22 DIAGNOSIS — I951 Orthostatic hypotension: Secondary | ICD-10-CM | POA: Insufficient documentation

## 2021-09-22 DIAGNOSIS — I493 Ventricular premature depolarization: Secondary | ICD-10-CM | POA: Diagnosis not present

## 2021-09-22 LAB — NM MYOCAR MULTI W/SPECT W/WALL MOTION / EF
Base ST Depression (mm): 0 mm
LV dias vol: 127 mL (ref 62–150)
LV sys vol: 68 mL
Nuc Stress EF: 46 %
Peak HR: 74 {beats}/min
Percent HR: 49 %
Rest HR: 56 {beats}/min
Rest Nuclear Isotope Dose: 10.2 mCi
SDS: 1
SRS: 10
SSS: 7
ST Depression (mm): 0 mm
Stress Nuclear Isotope Dose: 30.4 mCi
TID: 1.12

## 2021-09-22 MED ORDER — CLOPIDOGREL BISULFATE 75 MG PO TABS: 75.0000 mg | ORAL_TABLET | Freq: Every day | ORAL | 3 refills | Status: DC

## 2021-09-22 MED ORDER — TECHNETIUM TC 99M TETROFOSMIN IV KIT
10.0000 | PACK | Freq: Once | INTRAVENOUS | Status: AC
  Administered 2021-09-22: 10.16 via INTRAVENOUS

## 2021-09-22 MED ORDER — TECHNETIUM TC 99M TETROFOSMIN IV KIT
30.4400 | PACK | Freq: Once | INTRAVENOUS | Status: AC | PRN
  Administered 2021-09-22: 30.44 via INTRAVENOUS

## 2021-09-22 MED ORDER — REGADENOSON 0.4 MG/5ML IV SOLN
0.4000 mg | Freq: Once | INTRAVENOUS | Status: AC
  Administered 2021-09-22: 0.4 mg via INTRAVENOUS

## 2021-09-22 NOTE — Telephone Encounter (Signed)
Last office visit 07/30/2021 for syncope.  Last refilled 08/23/2021 for #40 with 1 refill.  Instructions are to take 4 times a day with meals and at bedtime so #40 is only a 10 day supply.  Ok to refill?  Increase Quantity??

## 2021-09-22 NOTE — Addendum Note (Signed)
Addended by: Valora Corporal on: 09/22/2021 07:21 AM   Modules accepted: Orders

## 2021-09-23 ENCOUNTER — Telehealth: Payer: Self-pay | Admitting: *Deleted

## 2021-09-23 NOTE — Telephone Encounter (Signed)
Left voicemail message to call back for review of results.  

## 2021-09-23 NOTE — Telephone Encounter (Signed)
-----   Message from Rise Mu, PA-C sent at 09/23/2021  7:17 AM EDT ----- Stress test showed no evidence of blockage with coronary artery calcification noted.  Overall, this was a low risk study.  Recent echo demonstrated a low normal LV systolic function.  Overall reassuring stress test.

## 2021-09-23 NOTE — Telephone Encounter (Signed)
Reviewed results with patient and he verbalized understanding with no further questions at this time. 

## 2021-09-29 ENCOUNTER — Other Ambulatory Visit: Payer: Self-pay | Admitting: Family Medicine

## 2021-09-29 DIAGNOSIS — N1831 Chronic kidney disease, stage 3a: Secondary | ICD-10-CM | POA: Diagnosis not present

## 2021-09-29 NOTE — Telephone Encounter (Signed)
Last office visit 07/30/21 for stomach.  Last refilled Syncope and Bloating Issues after eating.  Last refilled 08/31/21 for #30 with no refills. CPE scheduled 03/29/22.

## 2021-10-13 ENCOUNTER — Telehealth: Payer: Self-pay | Admitting: Gastroenterology

## 2021-10-13 NOTE — Telephone Encounter (Signed)
Patient has questions about his colonoscopy. Requesting a call back from nurse.

## 2021-10-15 ENCOUNTER — Telehealth: Payer: Self-pay | Admitting: Cardiovascular Disease

## 2021-10-15 ENCOUNTER — Telehealth: Payer: Self-pay

## 2021-10-15 NOTE — Telephone Encounter (Signed)
   Pre-operative Risk Assessment    Patient Name: Shawn Lam  DOB: 1950/09/12 MRN: [id number]      Request for Surgical Clearance    Procedure:   colonoscopy and EGD  Date of Surgery:  Clearance 10/19/21                                 Surgeon:  not indicated Surgeon's Group or Practice Name:  [facility]  Phone number:  [phone] Fax number:  [phone]   Type of Clearance Requested:   - Pharmacy:  Hold Clopidogrel (Plavix) instructions    Type of Anesthesia:  Not Indicated   Additional requests/questions:    Manfred Arch   10/15/2021, 3:11 PM

## 2021-10-15 NOTE — Telephone Encounter (Signed)
Msg sent to Dr Rockey Situ, Pt is aware to hold until I can reach out to Cardiology

## 2021-10-15 NOTE — Telephone Encounter (Signed)
Per private msg from Dr Rockey Situ today  Okay to stop Plavix 5 days prior to procedure.  On days when not on Plavix he should take aspirin 81 mg daily.  Following procedure can stop the aspirin go back on the Plavix.   Actual form faxed as well  Pt is aware as instructed

## 2021-10-15 NOTE — Telephone Encounter (Signed)
Pt is aware as instructed 

## 2021-10-15 NOTE — Telephone Encounter (Signed)
   Patient Name: Shawn Lam  DOB: 05/05/1950 MRN: [id number]  Primary Cardiologist: Ida Rogue, MD  Chart reviewed as part of pre-operative protocol coverage. Given past medical history and time since last visit, based on ACC/AHA guidelines, Shawn Lam would be at acceptable risk for the planned procedure without further cardiovascular testing.   Per office protocol, patient may hold Plavix for 5 days prior to procedure.  Please resume Plavix as soon as possible postprocedure, at the discretion of the surgeon.  I will route this recommendation to the requesting party via Epic fax function and remove from pre-op pool.  Please call with questions.  Lenna Sciara, NP 10/15/2021, 3:58 PM

## 2021-10-19 ENCOUNTER — Other Ambulatory Visit: Payer: Self-pay

## 2021-10-19 ENCOUNTER — Ambulatory Visit
Admission: RE | Admit: 2021-10-19 | Discharge: 2021-10-19 | Disposition: A | Discharge status: Home / Self Care | Payer: Medicare Other | Attending: Gastroenterology | Admitting: Gastroenterology

## 2021-10-19 ENCOUNTER — Encounter: Payer: Self-pay | Admitting: Gastroenterology

## 2021-10-19 ENCOUNTER — Ambulatory Visit: Payer: Medicare Other | Admitting: Anesthesiology

## 2021-10-19 ENCOUNTER — Encounter
Admission: RE | Disposition: A | Discharge status: Home / Self Care | Payer: Self-pay | Source: Home / Self Care | Attending: Gastroenterology

## 2021-10-19 DIAGNOSIS — R1310 Dysphagia, unspecified: Secondary | ICD-10-CM | POA: Diagnosis not present

## 2021-10-19 DIAGNOSIS — I2510 Atherosclerotic heart disease of native coronary artery without angina pectoris: Secondary | ICD-10-CM | POA: Diagnosis not present

## 2021-10-19 DIAGNOSIS — Z86010 Personal history of colonic polyps: Secondary | ICD-10-CM | POA: Diagnosis not present

## 2021-10-19 DIAGNOSIS — N028 Recurrent and persistent hematuria with other morphologic changes: Secondary | ICD-10-CM | POA: Diagnosis not present

## 2021-10-19 DIAGNOSIS — D120 Benign neoplasm of cecum: Secondary | ICD-10-CM | POA: Diagnosis not present

## 2021-10-19 DIAGNOSIS — K640 First degree hemorrhoids: Secondary | ICD-10-CM | POA: Diagnosis not present

## 2021-10-19 DIAGNOSIS — K219 Gastro-esophageal reflux disease without esophagitis: Secondary | ICD-10-CM | POA: Diagnosis not present

## 2021-10-19 DIAGNOSIS — R140 Abdominal distension (gaseous): Secondary | ICD-10-CM | POA: Diagnosis not present

## 2021-10-19 DIAGNOSIS — K635 Polyp of colon: Secondary | ICD-10-CM

## 2021-10-19 DIAGNOSIS — E785 Hyperlipidemia, unspecified: Secondary | ICD-10-CM | POA: Diagnosis not present

## 2021-10-19 DIAGNOSIS — F419 Anxiety disorder, unspecified: Secondary | ICD-10-CM | POA: Insufficient documentation

## 2021-10-19 DIAGNOSIS — F32A Depression, unspecified: Secondary | ICD-10-CM | POA: Diagnosis not present

## 2021-10-19 DIAGNOSIS — K2960 Other gastritis without bleeding: Secondary | ICD-10-CM | POA: Diagnosis not present

## 2021-10-19 DIAGNOSIS — G4733 Obstructive sleep apnea (adult) (pediatric): Secondary | ICD-10-CM | POA: Diagnosis not present

## 2021-10-19 DIAGNOSIS — K2970 Gastritis, unspecified, without bleeding: Secondary | ICD-10-CM | POA: Insufficient documentation

## 2021-10-19 DIAGNOSIS — R634 Abnormal weight loss: Secondary | ICD-10-CM | POA: Diagnosis not present

## 2021-10-19 DIAGNOSIS — E039 Hypothyroidism, unspecified: Secondary | ICD-10-CM | POA: Insufficient documentation

## 2021-10-19 DIAGNOSIS — D649 Anemia, unspecified: Secondary | ICD-10-CM | POA: Insufficient documentation

## 2021-10-19 DIAGNOSIS — Z6823 Body mass index (BMI) 23.0-23.9, adult: Secondary | ICD-10-CM | POA: Diagnosis not present

## 2021-10-19 DIAGNOSIS — Z955 Presence of coronary angioplasty implant and graft: Secondary | ICD-10-CM | POA: Diagnosis not present

## 2021-10-19 DIAGNOSIS — D126 Benign neoplasm of colon, unspecified: Secondary | ICD-10-CM | POA: Diagnosis not present

## 2021-10-19 HISTORY — PX: COLONOSCOPY WITH PROPOFOL: SHX5780

## 2021-10-19 HISTORY — PX: ESOPHAGOGASTRODUODENOSCOPY: SHX5428

## 2021-10-19 SURGERY — COLONOSCOPY WITH PROPOFOL
Anesthesia: General

## 2021-10-19 MED ORDER — PROPOFOL 10 MG/ML IV BOLUS
INTRAVENOUS | Status: DC | PRN
  Administered 2021-10-19: 10 mg via INTRAVENOUS
  Administered 2021-10-19: 60 mg via INTRAVENOUS
  Administered 2021-10-19: 10 mg via INTRAVENOUS

## 2021-10-19 MED ORDER — SODIUM CHLORIDE 0.9 % IV SOLN: INTRAVENOUS | Status: DC

## 2021-10-19 MED ORDER — LIDOCAINE HCL (CARDIAC) PF 100 MG/5ML IV SOSY
PREFILLED_SYRINGE | INTRAVENOUS | Status: DC | PRN
  Administered 2021-10-19: 100 mg via INTRAVENOUS

## 2021-10-19 MED ORDER — DEXMEDETOMIDINE HCL IN NACL 200 MCG/50ML IV SOLN
INTRAVENOUS | Status: DC | PRN
  Administered 2021-10-19: 8 ug via INTRAVENOUS

## 2021-10-19 MED ORDER — GLYCOPYRROLATE 0.2 MG/ML IJ SOLN
INTRAMUSCULAR | Status: DC | PRN
  Administered 2021-10-19: .2 mg via INTRAVENOUS

## 2021-10-19 MED ORDER — PROPOFOL 500 MG/50ML IV EMUL
INTRAVENOUS | Status: DC | PRN
  Administered 2021-10-19: 155 ug/kg/min via INTRAVENOUS

## 2021-10-19 NOTE — H&P (Signed)
Shawn Lame, MD [address] [place], [place] [address] Phone:[phone] Fax : [phone]  Primary Care Physician:  Jinny Sanders, MD Primary Gastroenterologist:  Dr. Allen Norris  Pre-Procedure History & Physical: HPI:  Shawn Lam is a 71 y.o. male is here for an endoscopy and colonoscopy.   Past Medical History:  Diagnosis Date   Actinic keratosis 02/04/2021   right lateral neck   Anemia    Anxiety    Asymptomatic LV dysfunction    50-55% by echo 06/2016   Berger's disease    CAD (coronary artery disease) 06/2005   PCI of LAD and RCA   Depression    Dilated aortic root (HCC)    4cm at sinus of Valsalva   Hematuria    Hyperlipidemia    Hypothyroidism    Insomnia    Left ureteral stone    Orthostatic hypotension    negative tilt table   OSA (obstructive sleep apnea)    moderate with AHI 17/hr, uses CPAP nightly(01/15/19 - has Inspire implant)   PVC's (premature ventricular contractions) 07/07/2016   Renal disorder    PT IS SEEING DR. Lorrene Reid- NEPHROLOGIST   Rosacea    Urticaria     Past Surgical History:  Procedure Laterality Date   CARDIAC CATHETERIZATION  09/06/2013   [facility]   CARDIAC CATHETERIZATION     COLONOSCOPY WITH PROPOFOL N/A 01/21/2019   Procedure: COLONOSCOPY WITH PROPOFOL;  Surgeon: Shawn Lame, MD;  Location: [facility];  Service: Endoscopy;  Laterality: N/A;  sleep apnea   CORONARY ANGIOPLASTY  2004   STENT PLACEMENT   CYSTOSCOPY WITH RETROGRADE PYELOGRAM, URETEROSCOPY AND STENT PLACEMENT Left 03/19/2014   Procedure: CYSTOSCOPY WITH RETROGRADE PYELOGRAM, URETEROSCOPY AND STENT PLACEMENT;  Surgeon: Junious Dresser, MD;  Location: [facility];  Service: Urology;  Laterality: Left;   CYSTOSCOPY WITH RETROGRADE PYELOGRAM, URETEROSCOPY AND STENT PLACEMENT Bilateral 05/20/2015   Procedure:  CYSTOSCOPY WITH BILATERAL RETROGRADE PYELOGRAM,RIGHT  DIAGNOSTIC URETEROSCOPY ,LEFT URETEROSCOPY WITH HOLMIUM LASER  AND BILATERAL  STENT  PLACEMENT ;  Surgeon: Alexis Frock, MD;  Location: [facility];  Service: Urology;  Laterality: Bilateral;   DRUG INDUCED ENDOSCOPY N/A 10/20/2017   Procedure: DRUG INDUCED SLEEP ENDOSCOPY;  Surgeon: Melida Quitter, MD;  Location: [facility];  Service: ENT;  Laterality: N/A;   EP IMPLANTABLE DEVICE N/A 05/13/2016   Procedure: Loop Recorder Insertion;  Surgeon: Deboraha Sprang, MD;  Location: [facility];  Service: Cardiovascular;  Laterality: N/A;   ESOPHAGOGASTRODUODENOSCOPY (EGD) WITH PROPOFOL N/A 11/30/2018   Procedure: ESOPHAGOGASTRODUODENOSCOPY (EGD) WITH PROPOFOL;  Surgeon: Jonathon Bellows, MD;  [facility];  Service: Gastroenterology;  Laterality: N/A;   ESOPHAGOGASTRODUODENOSCOPY (EGD) WITH PROPOFOL N/A 12/01/2018   Procedure: ESOPHAGOGASTRODUODENOSCOPY (EGD) WITH PROPOFOL;  Surgeon: Shawn Lame, MD;  Location: [facility] ENDOSCOPY;  Service: Endoscopy;  Laterality: N/A;   HOLMIUM LASER APPLICATION Bilateral 06/16/3426   Procedure: HOLMIUM LASER APPLICATION;  Surgeon: Alexis Frock, MD;  Location: [facility];  Service: Urology;  Laterality: Bilateral;   IMPLIMENTATION OF A HYPOGLOSSAL NERVE STIMULATOR  12/08/2017   OSA    LEFT HEART CATHETERIZATION WITH CORONARY ANGIOGRAM N/A 09/06/2013   Procedure: LEFT HEART CATHETERIZATION WITH CORONARY ANGIOGRAM;  Surgeon: Sinclair Grooms, MD;  Location: [facility] C[facility];  Service: Cardiovascular;  Laterality: N/A;   LEFT KNEE CAP  SURGERY ABOUT 40 YRS AGO  ? 1970     STONE EXTRACTION WITH BASKET Left 03/19/2014   Procedure: STONE EXTRACTION WITH BASKET;  Surgeon: Junious Dresser, MD;  Location: [facility];  Service: Urology;  Laterality: Left;   TRANSURETHRAL RESECTION OF PROSTATE N/A 08/25/2021   Procedure: TRANSURETHRAL RESECTION OF THE PROSTATE (TURP);  Surgeon: Alexis Frock, MD;  Location: [facility];  Service: Urology;  Laterality: N/A;  1 HR    Prior to Admission medications   Medication Sig Start Date End Date Taking? Authorizing  Provider  atorvastatin (LIPITOR) 80 MG tablet Take 1 tablet (80 mg total) by mouth daily. 05/10/21  Yes Bedsole, Amy E, MD  busPIRone (BUSPAR) 10 MG tablet TAKE 2 TABLETS BY MOUTH 3 TIMES DAILY. 04/28/21  Yes Bedsole, Amy E, MD  ferrous sulfate 325 (65 FE) MG tablet TAKE 1 TABLET BY MOUTH 3 TIMES DAILY WITH MEALS. 02/15/21  Yes Bedsole, Amy E, MD  finasteride (PROSCAR) 5 MG tablet Take 5 mg by mouth daily. 01/22/21  Yes [provider]  fludrocortisone (FLORINEF) 0.1 MG tablet Take 1 tablet (0.1 mg total) by mouth daily. 08/02/21  Yes Dunn, Ryan M, PA-C  FLUoxetine (PROZAC) 20 MG capsule TAKE 1 CAPSULE DAILY. TAKE WITH '40MG'$  TO EQUAL '60MG'$  09/16/21  Yes Bedsole, Amy E, MD  FLUoxetine (PROZAC) 40 MG capsule Take 1 capsule (40 mg total) by mouth every morning. 09/16/21  Yes Bedsole, Amy E, MD  furosemide (LASIX) 20 MG tablet TAKE 1 TABLET BY MOUTH DAILY AS NEEDED FOR SWELLING AND SHORTNESS OF BREATH. TAKE SPARINGLY. 09/21/21  Yes Gollan, Kathlene November, MD  gabapentin (NEURONTIN) 100 MG capsule TAKE TWO CAPSULES BY MOUTH TWICE A DAY 09/10/21  Yes Bedsole, Amy E, MD  levothyroxine (SYNTHROID) 75 MCG tablet TAKE 1 TABLET BY MOUTH EVERY DAY BEFORE BREAKFAST 03/29/21  Yes Bedsole, Amy E, MD  midodrine (PROAMATINE) 10 MG tablet Take 1 tablet (10 mg total) by mouth 3 (three) times daily. 04/27/21  Yes Gollan, Kathlene November, MD  mirtazapine (REMERON) 15 MG tablet TAKE 1 TABLET BY MOUTH EVERYDAY AT BEDTIME 05/30/21  Yes Bedsole, Amy E, MD  Multiple Vitamins-Minerals (MULTIVITAMIN WITH MINERALS) tablet Take 1 tablet by mouth daily.   Yes [provider]  pantoprazole (PROTONIX) 40 MG tablet Take 1 tablet (40 mg total) by mouth 2 (two) times daily. 09/09/21  Yes Bedsole, Amy E, MD  sucralfate (CARAFATE) 1 g tablet Take 1 tablet (1 g total) by mouth 4 (four) times daily -  with meals and at bedtime. 09/24/21  Yes Bedsole, Amy E, MD  zolpidem (AMBIEN) 10 MG tablet TAKE ONE TABLET BY MOUTH EVERY NIGHT AT BEDTIME AS NEEDED  09/29/21  Yes Bedsole, Amy E, MD  clopidogrel (PLAVIX) 75 MG tablet Take 1 tablet (75 mg total) by mouth daily. 09/22/21   Dunn, Areta Haber, PA-C  denosumab (PROLIA) 60 MG/ML SOSY injection Inject 60 mg into the skin every 6 (six) months. Do not send RX, order this from the office    [provider]  nitroGLYCERIN (NITROSTAT) 0.4 MG SL tablet Place 1 tablet (0.4 mg total) under the tongue every 5 (five) minutes as needed for chest pain. 03/26/21   Bedsole, Amy E, MD  triamcinolone cream (KENALOG) 0.1 % APPLY 1 APPLICATION TOPICALLY 2 (TWO) TIMES DAILY. AVOID FACE, GROIN, UNDERARMS. Patient taking differently: Apply 1 application. topically 2 (two) times daily as needed (irritation). Avoid face, groin, underarms. 10/07/20   Jinny Sanders, MD    Allergies as of 09/09/2021 - Review Complete 09/09/2021  Allergen Reaction Noted   Codeine Nausea Only 06/05/2007   Tape Other (See Comments) 03/19/2014  Family History  Problem Relation Age of Onset   Coronary artery disease Mother    Heart attack Mother    Heart disease Mother    Hypertension Mother    Cancer Father        lung and colon   Lung cancer Father        smoker   Diabetes Brother     Social History   Socioeconomic History   Marital status: Married    Spouse name: Not on file   Number of children: Not on file   Years of education: Not on file   Highest education level: Not on file  Occupational History   Occupation: retired Print production planner  Tobacco Use   Smoking status: Never   Smokeless tobacco: Never  Vaping Use   Vaping Use: Never used  Substance and Sexual Activity   Alcohol use: Never    Alcohol/week: 1.0 standard drink of alcohol    Types: 1 Cans of beer per week   Drug use: No   Sexual activity: Yes    Partners: Female    Birth control/protection: None  Other Topics Concern   Not on file  Social History Narrative   Regular exercise--yes--jog 5 miles 6 days a week   Social Determinants of Health    Financial Resource Strain: Low Risk  (02/26/2021)   Overall Financial Resource Strain (CARDIA)    Difficulty of Paying Living Expenses: Not hard at all  Food Insecurity: No Food Insecurity (02/26/2021)   Hunger Vital Sign    Worried About Running Out of Food in the Last Year: Never true    [place] in the Last Year: Never true  Transportation Needs: No Transportation Needs (02/26/2021)   PRAPARE - Hydrologist (Medical): No    Lack of Transportation (Non-Medical): No  Physical Activity: Sufficiently Active (02/26/2021)   Exercise Vital Sign    Days of Exercise per Week: 7 days    Minutes of Exercise per Session: 50 min  Stress: No Stress Concern Present (02/26/2021)   [place]    Feeling of Stress : Not at all  Social Connections: Moderately Isolated (02/26/2021)   Social Connection and Isolation Panel [NHANES]    Frequency of Communication with Friends and Family: Twice a week    Frequency of Social Gatherings with Friends and Family: More than three times a week    Attends Religious Services: Never    Marine scientist or Organizations: No    Attends Archivist Meetings: Never    Marital Status: Married  Human resources officer Violence: Not At Risk (02/26/2021)   Humiliation, Afraid, Rape, and Kick questionnaire    Fear of Current or Ex-Partner: No    Emotionally Abused: No    Physically Abused: No    Sexually Abused: No    Review of Systems: See HPI, otherwise negative ROS  Physical Exam: BP 138/87   Pulse 71   Temp (!) 97.3 F (36.3 C) (Temporal)   Resp 16   Ht 6' (1.829 m)   Wt 79.4 kg   SpO2 98%   BMI 23.73 kg/m  General:   Alert,  pleasant and cooperative in NAD Head:  Normocephalic and atraumatic. Neck:  Supple; no masses or thyromegaly. Lungs:  Clear throughout to auscultation.    Heart:  Regular rate and rhythm. Abdomen:  Soft, nontender  and nondistended. Normal bowel sounds, without guarding, and without  rebound.   Neurologic:  Alert and  oriented x4;  grossly normal neurologically.  Impression/Plan: Shawn Lam is here for an endoscopy and colonoscopy to be performed for  bloating and dysphagia with a history of colon polyps and a 10 pound weight loss  Risks, benefits, limitations, and alternatives regarding  endoscopy and colonoscopy have been reviewed with the patient.  Questions have been answered.  All parties agreeable.   Shawn Lame, MD  10/19/2021, 9:23 AM

## 2021-10-19 NOTE — Anesthesia Preprocedure Evaluation (Signed)
Anesthesia Evaluation  Patient identified by MRN, date of birth, ID band Patient awake    Reviewed: Allergy & Precautions, NPO status , Patient's Chart, lab work & pertinent test results  History of Anesthesia Complications Negative for: history of anesthetic complications  Airway Mallampati: III  TM Distance: >3 FB Neck ROM: full    Dental  (+) Chipped   Pulmonary sleep apnea ,    Pulmonary exam normal        Cardiovascular (-) angina+ CAD  Normal cardiovascular exam     Neuro/Psych PSYCHIATRIC DISORDERS  Neuromuscular disease    GI/Hepatic Neg liver ROS, GERD  Controlled,  Endo/Other  Hypothyroidism   Renal/GU Renal disease     Musculoskeletal   Abdominal   Peds  Hematology negative hematology ROS (+)   Anesthesia Other Findings Past Medical History: 02/04/2021: Actinic keratosis     Comment:  right lateral neck No date: Anemia No date: Anxiety No date: Asymptomatic LV dysfunction     Comment:  50-55% by echo 06/2016 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 Lorrene Reid- NEPHROLOGIST No date: Rosacea No date: Urticaria  Past Surgical History: 09/06/2013: CARDIAC CATHETERIZATION     Comment:  [place] No date: CARDIAC CATHETERIZATION 01/21/2019: COLONOSCOPY WITH PROPOFOL; N/A     Comment:  Procedure: COLONOSCOPY WITH PROPOFOL;   Surgeon: Lucilla Lame, MD;  Location: [place];  Service:               Endoscopy;  Laterality: N/A;  sleep apnea 2004: CORONARY ANGIOPLASTY     Comment:  STENT PLACEMENT 03/19/2014: CYSTOSCOPY WITH RETROGRADE PYELOGRAM, URETEROSCOPY AND  STENT PLACEMENT; Left     Comment:  Procedure: [place],               URETEROSCOPY AND STENT PLACEMENT;  Surgeon: Junious Dresser, MD;  Location: [facility];  Service: Urology;                Laterality: Left; 05/20/2015: CYSTOSCOPY WITH RETROGRADE PYELOGRAM, URETEROSCOPY AND  STENT PLACEMENT; Bilateral     Comment:  Procedure:  CYSTOSCOPY WITH BILATERAL RETROGRADE               PYELOGRAM,RIGHT  DIAGNOSTIC URETEROSCOPY ,LEFT               URETEROSCOPY WITH HOLMIUM LASER  AND BILATERAL  STENT               PLACEMENT ;  Surgeon: Alexis Frock, MD;  Location: [facility];  Service: Urology;  Laterality: Bilateral; 10/20/2017: DRUG INDUCED ENDOSCOPY; N/A  Comment:  Procedure: DRUG INDUCED SLEEP ENDOSCOPY;  Surgeon:               Melida Quitter, MD;  Location: [place];               Service: ENT;  Laterality: N/A; 05/13/2016: EP IMPLANTABLE DEVICE; N/A     Comment:  Procedure: Loop Recorder Insertion;  Surgeon: Deboraha Sprang, MD;  Location: [place] [facility];  Service:               Cardiovascular;  Laterality: N/A; 11/30/2018: ESOPHAGOGASTRODUODENOSCOPY (EGD) WITH PROPOFOL; N/A     Comment:  Procedure: ESOPHAGOGASTRODUODENOSCOPY (EGD) WITH               PROPOFOL;  Surgeon: Jonathon Bellows, MD;  Location: [facility]               ENDOSCOPY;  Service: Gastroenterology;  Laterality: N/A; 12/01/2018: ESOPHAGOGASTRODUODENOSCOPY (EGD) WITH PROPOFOL; N/A     Comment:  Procedure: ESOPHAGOGASTRODUODENOSCOPY (EGD) WITH               PROPOFOL;  Surgeon: Lucilla Lame, MD;  Location: [facility]               ENDOSCOPY;  Service: Endoscopy;  Laterality: N/A; 05/20/2015: HOLMIUM  LASER APPLICATION; Bilateral     Comment:  Procedure: HOLMIUM LASER APPLICATION;  Surgeon: Alexis Frock, MD;  Location: [facility];  Service: Urology;                Laterality: Bilateral; 12/08/2017: IMPLIMENTATION OF A HYPOGLOSSAL NERVE STIMULATOR     Comment:  OSA  09/06/2013: LEFT HEART CATHETERIZATION WITH CORONARY ANGIOGRAM; N/A     Comment:  Procedure: LEFT HEART CATHETERIZATION WITH CORONARY               ANGIOGRAM;  Surgeon: Sinclair Grooms, MD;  Location: [facility]              CATH LAB;  Service: Cardiovascular;  Laterality: N/A; No date: LEFT KNEE CAP  SURGERY ABOUT 40 YRS AGO  ? 1970 03/19/2014: STONE EXTRACTION WITH BASKET; Left     Comment:  Procedure: STONE EXTRACTION WITH BASKET;  Surgeon: Junious Dresser, MD;  Location: [facility];  Service:               Urology;  Laterality: Left; 08/25/2021: TRANSURETHRAL RESECTION OF PROSTATE; N/A     Comment:  Procedure: TRANSURETHRAL RESECTION OF THE PROSTATE               (TURP);  Surgeon: Alexis Frock, MD;  Location: [facility];              Service: Urology;  Laterality: N/A;  1 HR  BMI    Body Mass Index: 23.73 kg/m      Reproductive/Obstetrics negative OB ROS                             Anesthesia Physical Anesthesia Plan  ASA: 3  Anesthesia Plan: General   Post-op Pain Management:    Induction: Intravenous  PONV Risk Score and Plan: Ondansetron, Dexamethasone, Midazolam and Treatment may vary due to age or medical condition  Airway Management  Planned: Natural Airway and Nasal Cannula  Additional Equipment:   Intra-op Plan:   Post-operative Plan:   Informed Consent: I have reviewed the patients History and Physical, chart, labs and discussed the procedure including the risks, benefits and alternatives for the proposed anesthesia with the patient or authorized representative who has indicated his/her understanding and acceptance.     Dental Advisory Given  Plan  Discussed with: Anesthesiologist, CRNA and Surgeon  Anesthesia Plan Comments: (Patient consented for risks of anesthesia including but not limited to:  - adverse reactions to medications - risk of airway placement if required - damage to eyes, teeth, lips or other oral mucosa - nerve damage due to positioning  - sore throat or hoarseness - Damage to heart, brain, nerves, lungs, other parts of body or loss of life  Patient voiced understanding.)        Anesthesia Quick Evaluation

## 2021-10-19 NOTE — Transfer of Care (Signed)
Immediate Anesthesia Transfer of Care Note  Patient: Shawn Lam  Procedure(s) Performed: COLONOSCOPY WITH PROPOFOL ESOPHAGOGASTRODUODENOSCOPY (EGD)  Patient Location: Endoscopy Unit  Anesthesia Type:General  Level of Consciousness: awake, drowsy and patient cooperative  Airway & Oxygen Therapy: Patient Spontanous Breathing and Patient connected to face mask oxygen  Post-op Assessment: Report given to RN and Post -op Vital signs reviewed and stable  Post vital signs: Reviewed and stable  Last Vitals:  Vitals Value Taken Time  BP 117/76 10/19/21 0956  Temp 36.4 C 10/19/21 0956  Pulse 66 10/19/21 0957  Resp 17 10/19/21 0957  SpO2 100 % 10/19/21 0957  Vitals shown include unvalidated device data.  Last Pain:  Vitals:   10/19/21 0956  TempSrc: Temporal  PainSc:          Complications: No notable events documented.

## 2021-10-19 NOTE — Anesthesia Procedure Notes (Signed)
Procedure Name: General with mask airway Date/Time: 10/19/2021 9:41 AM  Performed by: Kelton Pillar, CRNAPre-anesthesia Checklist: Patient identified, Emergency Drugs available, Suction available and Patient being monitored Patient Re-evaluated:Patient Re-evaluated prior to induction Oxygen Delivery Method: Simple face mask Induction Type: IV induction Placement Confirmation: positive ETCO2, CO2 detector and breath sounds checked- equal and bilateral Dental Injury: Teeth and Oropharynx as per pre-operative assessment

## 2021-10-19 NOTE — Anesthesia Postprocedure Evaluation (Signed)
Anesthesia Post Note  Patient: MAXWELL LEMEN  Procedure(s) Performed: COLONOSCOPY WITH PROPOFOL ESOPHAGOGASTRODUODENOSCOPY (EGD)  Patient location during evaluation: Endoscopy Anesthesia Type: General Level of consciousness: awake and alert Pain management: pain level controlled Vital Signs Assessment: post-procedure vital signs reviewed and stable Respiratory status: spontaneous breathing, nonlabored ventilation, respiratory function stable and patient connected to nasal cannula oxygen Cardiovascular status: blood pressure returned to baseline and stable Postop Assessment: no apparent nausea or vomiting Anesthetic complications: no   No notable events documented.   Last Vitals:  Vitals:   10/19/21 1013 10/19/21 1020  BP: 138/81 (!) 147/84  Pulse: 66 67  Resp: 15 19  Temp:    SpO2: 99% 100%    Last Pain:  Vitals:   10/19/21 0956  TempSrc: Temporal  PainSc:                  Precious Haws Senon Nixon

## 2021-10-19 NOTE — Op Note (Signed)
[facility] Gastroenterology Patient Name: Shawn Lam Procedure Date: 10/19/2021 9:31 AM MRN: [id number] Account #: [id number] Date of Birth: [birth year] Admit Type: Outpatient Age: 71 Room: [facility] ENDO ROOM 4 Gender: Male Note Status: Finalized Instrument Name: Upper Endoscope [id number] Procedure:             Upper GI endoscopy Indications:           Abdominal bloating, Weight loss Providers:             Lucilla Lame MD, MD Referring MD:          Jinny Sanders MD, MD (Referring MD) Medicines:             Propofol per Anesthesia Complications:         No immediate complications. Procedure:             Pre-Anesthesia Assessment:                        - Prior to the procedure, a History and Physical was                         performed, and patient medications and allergies were                         reviewed. The patient's tolerance of previous                         anesthesia was also reviewed. The risks and benefits                         of the procedure and the sedation options and risks                         were discussed with the patient. All questions were                         answered, and informed consent was obtained. Prior                         Anticoagulants: The patient has taken no previous                         anticoagulant or antiplatelet agents. ASA Grade                         Assessment: II - A patient with mild systemic disease.                         After reviewing the risks and benefits, the patient                         was deemed in satisfactory condition to undergo the                         procedure.                        After obtaining informed consent, the endoscope was  passed under direct vision. Throughout the procedure,                         the patient's blood pressure, pulse, and oxygen                         saturations were monitored continuously. The Endoscope                          was introduced through the mouth, and advanced to the                         second part of duodenum. The upper GI endoscopy was                         accomplished without difficulty. The patient tolerated                         the procedure well. Findings:      The examined esophagus was normal.      Diffuse mild inflammation characterized by erythema was found in the       gastric antrum. Biopsies were taken with a cold forceps for histology.      The examined duodenum was normal. Biopsies were taken with a cold       forceps for histology. Impression:            - Normal esophagus.                        - Gastritis. Biopsied.                        - Normal examined duodenum. Biopsied. Recommendation:        - Discharge patient to home.                        - Resume previous diet.                        - Continue present medications.                        - Await pathology results. Procedure Code(s):     --- Professional ---                        [phone], Esophagogastroduodenoscopy, flexible,                         transoral; with biopsy, single or multiple Diagnosis Code(s):     --- Professional ---                        R14.0, Abdominal distension (gaseous)                        R63.4, Abnormal weight loss                        K29.70, Gastritis, unspecified, without bleeding CPT copyright 2019 American Medical Association. All rights reserved. The codes documented in this report are preliminary and upon coder review  may  be revised to meet current compliance requirements. Lucilla Lame MD, MD 10/19/2021 9:40:53 AM This report has been signed electronically. Number of Addenda: 0 Note Initiated On: 10/19/2021 9:31 AM Estimated Blood Loss:  Estimated blood loss: none.      [facility]

## 2021-10-19 NOTE — Op Note (Signed)
[facility] Gastroenterology Patient Name: Shawn Lam Procedure Date: 10/19/2021 9:30 AM MRN: [id number] Account #: [id number] Date of Birth: [birth year] Admit Type: Outpatient Age: 71 Room: [facility] ENDO ROOM 4 Gender: Male Note Status: Finalized Instrument Name: Jasper Riling [id number] Procedure:             Colonoscopy Indications:           Weight loss Providers:             Lucilla Lame MD, MD Referring MD:          Jinny Sanders MD, MD (Referring MD) Medicines:             Propofol per Anesthesia Complications:         No immediate complications. Procedure:             Pre-Anesthesia Assessment:                        - Prior to the procedure, a History and Physical was                         performed, and patient medications and allergies were                         reviewed. The patient's tolerance of previous                         anesthesia was also reviewed. The risks and benefits                         of the procedure and the sedation options and risks                         were discussed with the patient. All questions were                         answered, and informed consent was obtained. Prior                         Anticoagulants: The patient has taken no previous                         anticoagulant or antiplatelet agents. ASA Grade                         Assessment: II - A patient with mild systemic disease.                         After reviewing the risks and benefits, the patient                         was deemed in satisfactory condition to undergo the                         procedure.                        After obtaining informed consent, the colonoscope was  passed under direct vision. Throughout the procedure,                         the patient's blood pressure, pulse, and oxygen                         saturations were monitored continuously. The                         Colonoscope was introduced  through the anus and                         advanced to the the terminal ileum. The colonoscopy                         was performed without difficulty. The patient                         tolerated the procedure well. The quality of the bowel                         preparation was excellent. Findings:      A 2 mm polyp was found in the cecum. The polyp was sessile. The polyp       was removed with a cold biopsy forceps. Resection and retrieval were       complete.      Non-bleeding internal hemorrhoids were found during retroflexion. The       hemorrhoids were Grade I (internal hemorrhoids that do not prolapse).      The terminal ileum appeared normal. Impression:            - One 2 mm polyp in the cecum, removed with a cold                         biopsy forceps. Resected and retrieved.                        - Non-bleeding internal hemorrhoids.                        - The examined portion of the ileum was normal. Recommendation:        - Discharge patient to home.                        - Resume previous diet.                        - Continue present medications.                        - Await pathology results.                        - SIBO breath test to be ordered. Procedure Code(s):     --- Professional ---                        [phone], Colonoscopy, flexible; with biopsy, single or  multiple Diagnosis Code(s):     --- Professional ---                        R63.4, Abnormal weight loss                        K63.5, Polyp of colon CPT copyright 2019 American Medical Association. All rights reserved. The codes documented in this report are preliminary and upon coder review may  be revised to meet current compliance requirements. Lucilla Lame MD, MD 10/19/2021 9:59:49 AM This report has been signed electronically. Number of Addenda: 0 Note Initiated On: 10/19/2021 9:30 AM Scope Withdrawal Time: 0 hours 7 minutes 49 seconds  Total Procedure Duration: 0  hours 10 minutes 31 seconds  Estimated Blood Loss:  Estimated blood loss: none.      [facility]

## 2021-10-20 ENCOUNTER — Telehealth: Payer: Self-pay

## 2021-10-20 ENCOUNTER — Encounter: Payer: Self-pay | Admitting: Gastroenterology

## 2021-10-20 LAB — SURGICAL PATHOLOGY

## 2021-10-20 NOTE — Telephone Encounter (Signed)
Needs a SIBO breath test for bloating and weight loss.  Called pt to let him know that there is a payment of $99.74 due upfront before faxing order   Pt stated that he would call me back and let me know

## 2021-10-20 NOTE — Telephone Encounter (Signed)
Patient left a voicemail and states he is not going to have the breath test done

## 2021-10-21 ENCOUNTER — Encounter: Payer: Self-pay | Admitting: Gastroenterology

## 2021-10-22 DIAGNOSIS — H47013 Ischemic optic neuropathy, bilateral: Secondary | ICD-10-CM | POA: Diagnosis not present

## 2021-10-22 DIAGNOSIS — H52223 Regular astigmatism, bilateral: Secondary | ICD-10-CM | POA: Diagnosis not present

## 2021-10-22 DIAGNOSIS — H524 Presbyopia: Secondary | ICD-10-CM | POA: Diagnosis not present

## 2021-10-22 DIAGNOSIS — D3142 Benign neoplasm of left ciliary body: Secondary | ICD-10-CM | POA: Diagnosis not present

## 2021-10-22 DIAGNOSIS — Z961 Presence of intraocular lens: Secondary | ICD-10-CM | POA: Diagnosis not present

## 2021-10-22 DIAGNOSIS — H5213 Myopia, bilateral: Secondary | ICD-10-CM | POA: Diagnosis not present

## 2021-10-22 DIAGNOSIS — H40013 Open angle with borderline findings, low risk, bilateral: Secondary | ICD-10-CM | POA: Diagnosis not present

## 2021-10-27 ENCOUNTER — Other Ambulatory Visit: Payer: Self-pay | Admitting: Family Medicine

## 2021-10-27 NOTE — Telephone Encounter (Signed)
Last office visit 07/30/21 for Stomach issues.  Last refilled 09/29/21 for #30 with no refills.  CPE scheduled 03/29/22.

## 2021-11-01 ENCOUNTER — Telehealth: Payer: Self-pay | Admitting: Family Medicine

## 2021-11-01 DIAGNOSIS — G4733 Obstructive sleep apnea (adult) (pediatric): Secondary | ICD-10-CM | POA: Diagnosis not present

## 2021-11-01 DIAGNOSIS — F5101 Primary insomnia: Secondary | ICD-10-CM | POA: Diagnosis not present

## 2021-11-01 NOTE — Telephone Encounter (Signed)
Refill sent.

## 2021-11-01 NOTE — Telephone Encounter (Signed)
Refill pending from 10/27/21.  Awaiting MD approval.

## 2021-11-01 NOTE — Telephone Encounter (Signed)
  Encourage patient to contact the pharmacy for refills or they can request refills through [facility]  Did the patient contact the pharmacy:  yes   LAST APPOINTMENT DATE:  Please schedule appointment if longer than 1 year  NEXT APPOINTMENT DATE:03/22/2022  MEDICATION:zolpidem (AMBIEN) 10 MG tablet  Is the patient out of medication? yes  If not, how much is left?  Is this a 90 day supply: no  PHARMACY:[facility] [id number], [place], [place] [address]  Phone:  [phone]  Fax:  [phone]   Let patient know to contact pharmacy at the end of the day to make sure medication is ready.

## 2021-11-04 ENCOUNTER — Other Ambulatory Visit: Payer: Self-pay | Admitting: Family Medicine

## 2021-11-05 DIAGNOSIS — M5412 Radiculopathy, cervical region: Secondary | ICD-10-CM | POA: Diagnosis not present

## 2021-11-05 NOTE — Telephone Encounter (Signed)
Last office visit 07/30/21 for Stomach Issues.  Last refilled 09/24/21 for #120 with no refills.  CPE scheduled 03/29/2022.

## 2021-11-07 ENCOUNTER — Other Ambulatory Visit: Payer: Self-pay | Admitting: Family Medicine

## 2021-11-07 DIAGNOSIS — F32A Depression, unspecified: Secondary | ICD-10-CM

## 2021-11-07 DIAGNOSIS — F411 Generalized anxiety disorder: Secondary | ICD-10-CM

## 2021-11-08 ENCOUNTER — Other Ambulatory Visit: Payer: Self-pay | Admitting: Family Medicine

## 2021-11-08 NOTE — Telephone Encounter (Signed)
Last office visit 07/30/21 for syncope, orthostatic hypotension, GAD, iron deficiency anemia and history of duodenal ulcer.  Last refilled 09/10/21 for #120 with 1 refill.  CPE scheduled for 03/29/22.

## 2021-11-23 ENCOUNTER — Other Ambulatory Visit: Payer: Self-pay | Admitting: Family Medicine

## 2021-11-23 NOTE — Telephone Encounter (Signed)
Last ov 07/26/21 Last filled 0517/23

## 2021-11-29 ENCOUNTER — Other Ambulatory Visit: Payer: Self-pay | Admitting: Family Medicine

## 2021-11-29 NOTE — Telephone Encounter (Signed)
Refill request Ambien Last refill  11/01/21 #30 Last office visit 07/30/21

## 2021-11-30 ENCOUNTER — Ambulatory Visit (INDEPENDENT_AMBULATORY_CARE_PROVIDER_SITE_OTHER): Payer: Medicare Other | Admitting: Family Medicine

## 2021-11-30 ENCOUNTER — Encounter: Payer: Self-pay | Admitting: Family Medicine

## 2021-11-30 VITALS — BP 130/66 | HR 71 | Temp 98.3°F | Resp 16 | Ht 73.0 in | Wt 168.0 lb

## 2021-11-30 DIAGNOSIS — R293 Abnormal posture: Secondary | ICD-10-CM | POA: Diagnosis not present

## 2021-11-30 DIAGNOSIS — F411 Generalized anxiety disorder: Secondary | ICD-10-CM

## 2021-11-30 DIAGNOSIS — I2510 Atherosclerotic heart disease of native coronary artery without angina pectoris: Secondary | ICD-10-CM

## 2021-11-30 DIAGNOSIS — G8929 Other chronic pain: Secondary | ICD-10-CM | POA: Insufficient documentation

## 2021-11-30 DIAGNOSIS — M542 Cervicalgia: Secondary | ICD-10-CM

## 2021-11-30 DIAGNOSIS — G4486 Cervicogenic headache: Secondary | ICD-10-CM

## 2021-11-30 MED ORDER — DULOXETINE HCL 30 MG PO CPEP: ORAL_CAPSULE | ORAL | 3 refills | Status: DC

## 2021-11-30 NOTE — Patient Instructions (Addendum)
Stop muscle relaxant.  Complete prednisone course.  Limit head rotation.. consider cervical collar.  Call if headache is not improving for further evaluation  with possible head CT.  Decrease fluoxetine to 40 mg daily x 1 week then stop. Afterstopping fluoxetine,  start Cymbalta 30 mg daily, increase to 60 mg after 1 week

## 2021-11-30 NOTE — Progress Notes (Signed)
Patient ID: CARMAN ESSICK, male    DOB: April 09, 1951, 71 y.o.   MRN: [id number]  This visit was conducted in person.  BP 130/66   Pulse 71   Temp 98.3 F (36.8 C)   Resp 16   Ht '6\' 1"'$  (1.854 m)   Wt 168 lb (76.2 kg)   SpO2 98%   BMI 22.16 kg/m    CC:  Chief Complaint  Patient presents with   Neck Pain    X 7-8 month and is getting worse.   Headache    Comes on during the day but gets worse at night    Subjective:   HPI: HART HAAS is a 71 y.o. male presenting on 11/30/2021 for Neck Pain (X 7-8 month and is getting worse.) and Headache (Comes on during the day but gets worse at night)  He has noted neck pain ongoing x 7-8 month, progressively worsening.   Pain on bilateral, L> R at base on skull, pain radiates to upper back .  Starts with headache later in the day, 6/10 on pain scale occurring daily. Occ waking him up at night from sleep.   Seen by PA at [facility], note reviewed.  X-ray reviewed :   no fracture, facet joint arthritis .[name]  Dx with cervical radiculopathy.  Pain worse with tipping head back.   Given prednisone but he did not take it.   He tried tizanidine which did not help at all. Makes him woozy and confusion.   No N/V, no numbness, no new blurred vision, no  new weakness. No slurred speech, no confusion   No fall, no head injury.        Hx of osteoporosis  (on prolia)and thoracic back pain from compression fracture.   CKD, GERD, CAD.[name] NSAID contraindicated Relevant past medical, surgical, family and social history reviewed and updated as indicated. Interim medical history since our last visit reviewed. Allergies and medications reviewed and updated. Outpatient Medications Prior to Visit  Medication Sig Dispense Refill   atorvastatin (LIPITOR) 80 MG tablet Take 1 tablet (80 mg total) by mouth daily. 90 tablet 3   busPIRone (BUSPAR) 10 MG tablet TAKE 2 TABLETS BY MOUTH 3 TIMES DAILY. 540 tablet 1   clopidogrel (PLAVIX) 75 MG tablet  Take 1 tablet (75 mg total) by mouth daily. 90 tablet 3   denosumab (PROLIA) 60 MG/ML SOSY injection Inject 60 mg into the skin every 6 (six) months. Do not send RX, order this from the office     ferrous sulfate 325 (65 FE) MG tablet TAKE 1 TABLET BY MOUTH 3 TIMES DAILY WITH MEALS. 270 tablet 3   finasteride (PROSCAR) 5 MG tablet Take 5 mg by mouth daily.     fludrocortisone (FLORINEF) 0.1 MG tablet Take 1 tablet (0.1 mg total) by mouth daily. 90 tablet 3   FLUoxetine (PROZAC) 20 MG capsule TAKE 1 CAPSULE DAILY. TAKE WITH '40MG'$  TO EQUAL '60MG'$     ONCE DAILY 90 capsule 1   FLUoxetine (PROZAC) 40 MG capsule Take 1 capsule (40 mg total) by mouth every morning. 90 capsule 0   furosemide (LASIX) 20 MG tablet TAKE 1 TABLET BY MOUTH DAILY AS NEEDED FOR SWELLING AND SHORTNESS OF BREATH. TAKE SPARINGLY. 30 tablet 2   gabapentin (NEURONTIN) 100 MG capsule TAKE 2 CAPSULES BY MOUTH TWICE A DAY 120 capsule 1   levothyroxine (SYNTHROID) 75 MCG tablet TAKE 1 TABLET BY MOUTH EVERY DAY BEFORE BREAKFAST 90 tablet 3  midodrine (PROAMATINE) 10 MG tablet Take 1 tablet (10 mg total) by mouth 3 (three) times daily. 270 tablet 3   mirtazapine (REMERON) 15 MG tablet TAKE ONE TABLET BY MOUTH EVERY NIGHT AT BEDTIME 90 tablet 1   Multiple Vitamins-Minerals (MULTIVITAMIN WITH MINERALS) tablet Take 1 tablet by mouth daily.     nitroGLYCERIN (NITROSTAT) 0.4 MG SL tablet Place 1 tablet (0.4 mg total) under the tongue every 5 (five) minutes as needed for chest pain. 10 tablet 1   pantoprazole (PROTONIX) 40 MG tablet Take 1 tablet (40 mg total) by mouth 2 (two) times daily. 180 tablet 0   sucralfate (CARAFATE) 1 g tablet TAKE ONE TABLET BY MOUTH FOUR TIMES A DAY WITH MEALS AND AT BEDTIME 120 tablet 0   triamcinolone cream (KENALOG) 0.1 % APPLY 1 APPLICATION TOPICALLY 2 (TWO) TIMES DAILY. AVOID FACE, GROIN, UNDERARMS. (Patient taking differently: Apply 1 application  topically 2 (two) times daily as needed (irritation). Avoid face,  groin, underarms.) 454 g 0   zolpidem (AMBIEN) 10 MG tablet TAKE ONE TABLET BY MOUTH EVERY NIGHT AT BEDTIME AS NEEDED 30 tablet 0   No facility-administered medications prior to visit.     Per HPI unless specifically indicated in ROS section below Review of Systems  Constitutional:  Negative for fatigue and fever.  HENT:  Negative for ear pain.   Eyes:  Negative for pain.  Respiratory:  Negative for cough and shortness of breath.   Cardiovascular:  Negative for chest pain, palpitations and leg swelling.  Gastrointestinal:  Negative for abdominal pain.  Genitourinary:  Negative for dysuria.  Musculoskeletal:  Negative for arthralgias.  Neurological:  Negative for syncope, light-headedness and headaches.  Psychiatric/Behavioral:  Negative for dysphoric mood.    Objective:  BP 130/66   Pulse 71   Temp 98.3 F (36.8 C)   Resp 16   Ht '6\' 1"'$  (1.854 m)   Wt 168 lb (76.2 kg)   SpO2 98%   BMI 22.16 kg/m   Wt Readings from Last 3 Encounters:  11/30/21 168 lb (76.2 kg)  10/19/21 175 lb (79.4 kg)  09/09/21 166 lb (75.3 kg)      Physical Exam Constitutional:      Appearance: He is well-developed.  HENT:     Head: Normocephalic.     Right Ear: Hearing normal.     Left Ear: Hearing normal.     Nose: Nose normal.  Neck:     Thyroid: No thyroid mass or thyromegaly.     Vascular: No carotid bruit.     Trachea: Trachea normal.  Cardiovascular:     Rate and Rhythm: Normal rate and regular rhythm.     Pulses: Normal pulses.     Heart sounds: Heart sounds not distant. No murmur heard.    No friction rub. No gallop.     Comments: No peripheral edema Pulmonary:     Effort: Pulmonary effort is normal. No respiratory distress.     Breath sounds: Normal breath sounds.  Musculoskeletal:     Cervical back: Pain with movement and muscular tenderness present. No spinous process tenderness. Decreased range of motion.  Lymphadenopathy:     Cervical: No cervical adenopathy.  Skin:     General: Skin is warm and dry.     Findings: No rash.  Psychiatric:        Speech: Speech normal.        Behavior: Behavior normal.        Thought Content: Thought content normal.  Results for orders placed or performed during the hospital encounter of 10/19/21  Surgical pathology  Result Value Ref Range   SURGICAL PATHOLOGY      SURGICAL PATHOLOGY CASE: [id number] PATIENT: [name] Surgical Pathology Report     Specimen Submitted: A. Stomach; cbx B. Duodenum; cbx C. Colon polyp, cecum; cbx  Clinical History: Dysphagia R13.10, bloating R14.0, weight loss R63.4. Gastritis, colon polyp.      DIAGNOSIS: A.  STOMACH; COLD BIOPSY: - PATCHY MILD REACTIVE GASTRITIS. - NEGATIVE FOR H. PYLORI, INTESTINAL METAPLASIA, DYSPLASIA, AND MALIGNANCY.  B.  DUODENUM; COLD BIOPSY: - UNREMARKABLE DUODENAL MUCOSA. - NEGATIVE FOR DYSPLASIA AND MALIGNANCY.  C.  COLON POLYP, CECUM; COLD BIOPSY: - TUBULAR ADENOMA. - NEGATIVE FOR HIGH-GRADE DYSPLASIA AND MALIGNANCY.  GROSS DESCRIPTION: A. Labeled: Gastric cbxs for gastritis Received: Formalin Collection time: 9:36 AM on 10/19/2021 Placed into formalin time: 9:36 AM on 10/19/2021 Tissue fragment(s): 2 Size: Range from 0.3-0.4 cm Description: Tan soft tissue fragments Entirely submitted in 1 cassette.  B. Labeled: Duodenal cbxs for weight l oss Received: Formalin Collection time: 9:37 AM on 10/19/2021 Placed into formalin time: 9:37 AM on 10/19/2021 Tissue fragment(s): 3 Size: Aggregate, 0.5 x 0.4 x 0.2 cm Description: Received are fragments of tan soft tissue.  The smallest fragment may not survive processing. Entirely submitted in 1 cassette.  C. Labeled: Cecal polyp cbx Received: Formalin Collection time: 9:46 AM on 10/19/2021 Placed into formalin time: 9:46 AM on 10/19/2021 Tissue fragment(s): 3 Size: Aggregate, 0.7 x 0.3 x 0.2 cm Description: Tan soft tissue fragments Entirely submitted in 1 cassette.  RB  10/19/2021  Final Diagnosis performed by Quay Burow, MD.   Electronically signed 10/20/2021 1:28:59PM The electronic signature indicates that the named Attending Pathologist has evaluated the specimen Technical component performed at [facility], [address], [place], [place] [address] Lab: [phone] Dir: Rush Farmer, MD, MMM  Professional component performed at [facility], [facility], [employer], [place], [place] [address] Lab: [phone] Dir: Kathi Simpers, MD      COVID 19 screen:  No recent travel or known exposure to [place] The patient denies respiratory symptoms of COVID 19 at this time. The importance of social distancing was discussed today.   Assessment and Plan    Problem List Items Addressed This Visit     Generalized anxiety disorder (Chronic)    Chronic, inadequate control.   Decrease fluoxetine to 40 mg daily x 1 week then stop. Afterstopping fluoxetine,  start Cymbalta 30 mg daily, increase to 60 mg after 1 week       Relevant Medications   DULoxetine (CYMBALTA) 30 MG capsule   Cervicogenic headache    No red flags, no current indication for emergent imaging.      Relevant Medications   DULoxetine (CYMBALTA) 30 MG capsule   Neck pain, acute - Primary    Stop muscle relaxant.  Complete prednisone course.  Limit head rotation.. consider cervical collar.  Call if headache is not improving for further evaluation  with possible head CT.        [name], MD

## 2021-12-07 DIAGNOSIS — Z125 Encounter for screening for malignant neoplasm of prostate: Secondary | ICD-10-CM | POA: Diagnosis not present

## 2021-12-07 DIAGNOSIS — R3915 Urgency of urination: Secondary | ICD-10-CM | POA: Diagnosis not present

## 2021-12-07 DIAGNOSIS — R3912 Poor urinary stream: Secondary | ICD-10-CM | POA: Diagnosis not present

## 2021-12-07 DIAGNOSIS — N200 Calculus of kidney: Secondary | ICD-10-CM | POA: Diagnosis not present

## 2021-12-08 DIAGNOSIS — Z23 Encounter for immunization: Secondary | ICD-10-CM | POA: Diagnosis not present

## 2021-12-10 ENCOUNTER — Encounter: Payer: Self-pay | Admitting: Physician Assistant

## 2021-12-10 ENCOUNTER — Ambulatory Visit: Payer: Medicare Other | Attending: Physician Assistant | Admitting: Physician Assistant

## 2021-12-10 VITALS — BP 90/62 | HR 64 | Ht 72.0 in | Wt 163.2 lb

## 2021-12-10 DIAGNOSIS — I493 Ventricular premature depolarization: Secondary | ICD-10-CM

## 2021-12-10 DIAGNOSIS — N183 Chronic kidney disease, stage 3 unspecified: Secondary | ICD-10-CM

## 2021-12-10 DIAGNOSIS — I2510 Atherosclerotic heart disease of native coronary artery without angina pectoris: Secondary | ICD-10-CM

## 2021-12-10 DIAGNOSIS — I951 Orthostatic hypotension: Secondary | ICD-10-CM | POA: Diagnosis not present

## 2021-12-10 DIAGNOSIS — E785 Hyperlipidemia, unspecified: Secondary | ICD-10-CM

## 2021-12-10 DIAGNOSIS — G4733 Obstructive sleep apnea (adult) (pediatric): Secondary | ICD-10-CM

## 2021-12-10 MED ORDER — ABDOMINAL BINDER/ELASTIC LARGE MISC: 1.0000 [IU] | Freq: Every day | 1 refills | Status: DC

## 2021-12-10 NOTE — Patient Instructions (Signed)
Medication Instructions:  Your physician recommends that you continue on your current medications as directed. Please refer to the Current Medication list given to you today.  *If you need a refill on your cardiac medications before your next appointment, please call your pharmacy*   Lab Work: None If you have labs (blood work) drawn today and your tests are completely normal, you will receive your results only by: [place] (if you have MyChart) OR A paper copy in the mail If you have any lab test that is abnormal or we need to change your treatment, we will call you to review the results.   Follow-Up: At [facility], you and your health needs are our priority.  As part of our continuing mission to provide you with exceptional heart care, we have created designated Provider Care Teams.  These Care Teams include your primary Cardiologist (physician) and Advanced Practice Providers (APPs -  Physician Assistants and Nurse Practitioners) who all work together to provide you with the care you need, when you need it.  Your next appointment:   6 month(s)  The format for your next appointment:   In Person  Provider:   Christell Faith, PA-C   Important Information About Sugar

## 2021-12-10 NOTE — Progress Notes (Signed)
Cardiology Office Note    Date:  12/10/2021   ID:  Shawn Lam, DOB 10-25-1950, MRN [id number]  PCP:  Jinny Sanders, MD  Cardiologist:  Ida Rogue, MD  Electrophysiologist:  None   Chief Complaint: Follow up  History of Present Illness:   Shawn Lam is a 71 y.o. male with history of CAD status post PCI to the LAD and RCA, recurrent syncope secondary felt to be secondary to orthostatic hypotension, frequent PVCs, HLD, mildly dilated aortic root, CKD stage III, iron deficiency anemia, hypothyroidism, OSA status post Inspire device, and lymphedema who presents for follow-up of Lexiscan MPI.   Cardiac MRI in 10/2013 demonstrated normal LVEF and RVEF with no evidence of LGE.   He underwent loop recorder implantation in 05/2016 with device interrogations showing no evidence of significant arrhythmia, prolonged pauses, or high-grade AV block.   Lexiscan MPI in 11/2018 showed no evidence of ischemia and was overall low risk with an EF of 55 to 65%.  Echo from 11/2018 demonstrated an EF of 60 to 62%, diastolic dysfunction, no regional wall motion abnormalities, normal RV systolic function and ventricular cavity size, normal PASP, aorta appeared to be normal, and no significant valvular abnormalities.   He was seen in the office in 04/2021 and noted a syncopal episode around Christmas time in 2022 associated with standing.  He also reported some palpitations when laying down at night.  He remained on midodrine 10 mg 3 times daily.   He was seen in the office on 08/02/2021 for evaluation of recurrent syncope and preoperative cardiac risk ratification for TURP.  Symptoms of syncope continued to be in the context of positional dizziness.  He was without symptoms of angina or decompensation.  Given recurrent syncope, he underwent repeat Zio patch monitoring which demonstrated a predominant rhythm of sinus with an average rate of 68 bpm (range 40 to 193 bpm) 11 episodes of SVT with the fastest  interval lasting 4 beats with a maximum rate of 193 bpm and the longest episode lasting 15 beats, frequent PVCs with a burden of 8.4%.  Given PVC burden, and in the context of significant orthostasis, EP referral was recommended.  Echo demonstrated an EF of 50%, global hypokinesis, normal RV systolic function and ventricular cavity size, normal PASP, mild mitral regurgitation, and an estimated right atrial pressure of 15 mmHg.  PVCs were noted during the study.   He was evaluated by EP on 09/01/2021 for evaluation of frequent PVCs with conservative approach recommended given burden of less than 10%.  He was last seen in the office in 08/2021 and was without symptoms of angina or decompensation.  He tolerated the addition of fludrocortisone without issues.  Subsequent Lexiscan MPI in 09/2021 showed no evidence of ischemia with a calculated EF of 39%, however echo 1 month prior demonstrated preserved LV systolic function.  LAD and RCA calcifications were noted on CT attenuation corrected images.  Overall, this was a low risk study.  He comes in doing well from a cardiac perspective, and is without symptoms of angina or decompensation.  He has not had a syncopal episode in almost 1 year.  He does continue to note some positional dizziness.  He is adherent and tolerating midodrine and fludrocortisone as well as knee-high compression socks.  He liberalize his salt and fluid intake.  Palpitations are less frequent.  Overall, he feels like he is doing reasonably well.   Labs independently reviewed: 08/2021 - Hgb 12, PLT 143, potassium  3.7, BUN 16, serum creatinine 1.24 07/2021 - albumin 4.0, AST 41, ALT normal 03/2021 - TC 128, TG 46, HDL 73, LDL 45, TSH normal  Past Medical History:  Diagnosis Date   Actinic keratosis 02/04/2021   right lateral neck   Anemia    Anxiety    Asymptomatic LV dysfunction    50-55% by echo 06/2016   Berger's disease    CAD (coronary artery disease) 06/2005   PCI of LAD and RCA    Depression    Dilated aortic root (HCC)    4cm at sinus of Valsalva   Hematuria    Hyperlipidemia    Hypothyroidism    Insomnia    Left ureteral stone    Orthostatic hypotension    negative tilt table   OSA (obstructive sleep apnea)    moderate with AHI 17/hr, uses CPAP nightly(01/15/19 - has Inspire implant)   PVC's (premature ventricular contractions) 07/07/2016   Renal disorder    PT IS SEEING DR. Lorrene Reid- NEPHROLOGIST   Rosacea    Urticaria     Past Surgical History:  Procedure Laterality Date   CARDIAC CATHETERIZATION  09/06/2013   [facility]   CARDIAC CATHETERIZATION     COLONOSCOPY WITH PROPOFOL N/A 01/21/2019   Procedure: COLONOSCOPY WITH PROPOFOL;  Surgeon: Lucilla Lame, MD;  Location: [place];  Service: Endoscopy;  Laterality: N/A;  sleep apnea   COLONOSCOPY WITH PROPOFOL N/A 10/19/2021   Procedure: COLONOSCOPY WITH PROPOFOL;  Surgeon: Lucilla Lame, MD;  Location: [facility] ENDOSCOPY;  Service: Endoscopy;  Laterality: N/A;   CORONARY ANGIOPLASTY  2004   STENT PLACEMENT   CYSTOSCOPY WITH RETROGRADE PYELOGRAM, URETEROSCOPY AND STENT PLACEMENT Left 03/19/2014   Procedure: CYSTOSCOPY WITH RETROGRADE PYELOGRAM, URETEROSCOPY AND STENT PLACEMENT;  Surgeon: Junious Dresser, MD;  Location: [facility];  Service: Urology;  Laterality: Left;   CYSTOSCOPY WITH RETROGRADE PYELOGRAM, URETEROSCOPY AND STENT PLACEMENT Bilateral 05/20/2015   Procedure:  CYSTOSCOPY WITH BILATERAL RETROGRADE PYELOGRAM,RIGHT  DIAGNOSTIC URETEROSCOPY ,LEFT URETEROSCOPY WITH HOLMIUM LASER  AND BILATERAL  STENT PLACEMENT ;  Surgeon: Alexis Frock, MD;  Location: [facility];  Service: Urology;  Laterality: Bilateral;   DRUG INDUCED ENDOSCOPY N/A 10/20/2017   Procedure: DRUG INDUCED SLEEP ENDOSCOPY;  Surgeon: Melida Quitter, MD;  Location: [place];  Service: ENT;  Laterality: N/A;   EP IMPLANTABLE DEVICE N/A 05/13/2016   Procedure: Loop Recorder Insertion;  Surgeon: Deboraha Sprang, MD;  Location: [place] [facility];  Service: Cardiovascular;  Laterality: N/A;   ESOPHAGOGASTRODUODENOSCOPY N/A 10/19/2021   Procedure: ESOPHAGOGASTRODUODENOSCOPY (EGD);  Surgeon: Lucilla Lame, MD;  Location: [facility] ENDOSCOPY;  Service: Endoscopy;  Laterality: N/A;   ESOPHAGOGASTRODUODENOSCOPY (EGD) WITH PROPOFOL N/A 11/30/2018   Procedure: ESOPHAGOGASTRODUODENOSCOPY (EGD) WITH PROPOFOL;  Surgeon: Jonathon Bellows, MD;  Location: [facility] ENDOSCOPY;  Service: Gastroenterology;  Laterality: N/A;   ESOPHAGOGASTRODUODENOSCOPY (EGD) WITH PROPOFOL N/A 12/01/2018   Procedure: ESOPHAGOGASTRODUODENOSCOPY (EGD) WITH PROPOFOL;  Surgeon: Lucilla Lame, MD;  Location: [facility] ENDOSCOPY;  Service: Endoscopy;  Laterality: N/A;   HOLMIUM LASER APPLICATION Bilateral 09/12/4032   Procedure: HOLMIUM LASER APPLICATION;  Surgeon: Alexis Frock, MD;  Location: [facility];  Service: Urology;  Laterality: Bilateral;   IMPLIMENTATION OF A HYPOGLOSSAL NERVE STIMULATOR  12/08/2017   OSA    LEFT HEART CATHETERIZATION WITH CORONARY ANGIOGRAM N/A 09/06/2013   Procedure: LEFT HEART CATHETERIZATION WITH CORONARY ANGIOGRAM;  Surgeon: Sinclair Grooms, MD;  Location: [facility] CATH LAB;  Service: Cardiovascular;  Laterality: N/A;   LEFT KNEE CAP  SURGERY ABOUT [place] Left 03/19/2014   Procedure: STONE EXTRACTION WITH BASKET;  Surgeon: Junious Dresser, MD;  Location: [facility];  Service: Urology;  Laterality: Left;   TRANSURETHRAL RESECTION OF PROSTATE N/A 08/25/2021   Procedure: TRANSURETHRAL RESECTION OF THE PROSTATE (TURP);  Surgeon: Alexis Frock, MD;  Location: [facility];  Service: Urology;  Laterality: N/A;  1 HR    Current Medications: Current Meds  Medication Sig   atorvastatin (LIPITOR) 80 MG tablet Take 1 tablet (80 mg total) by mouth daily.   busPIRone (BUSPAR) 10 MG tablet TAKE 2 TABLETS BY MOUTH 3 TIMES DAILY.   clopidogrel (PLAVIX) 75 MG tablet Take 1 tablet (75 mg total) by mouth daily.   denosumab (PROLIA) 60  MG/ML SOSY injection Inject 60 mg into the skin every 6 (six) months. Do not send RX, order this from the office   DULoxetine (CYMBALTA) 30 MG capsule 1 tablet daily x 1 week then increase to 2 tablets daily   Elastic Bandages & Supports (ABDOMINAL BINDER/ELASTIC LARGE) MISC 1 Units by Does not apply route daily.   ferrous sulfate 325 (65 FE) MG tablet TAKE 1 TABLET BY MOUTH 3 TIMES DAILY WITH MEALS.   finasteride (PROSCAR) 5 MG tablet Take 5 mg by mouth daily.   fludrocortisone (FLORINEF) 0.1 MG tablet Take 1 tablet (0.1 mg total) by mouth daily.   furosemide (LASIX) 20 MG tablet TAKE 1 TABLET BY MOUTH DAILY AS NEEDED FOR SWELLING AND SHORTNESS OF BREATH. TAKE SPARINGLY.   gabapentin (NEURONTIN) 100 MG capsule TAKE 2 CAPSULES BY MOUTH TWICE A DAY   levothyroxine (SYNTHROID) 75 MCG tablet TAKE 1 TABLET BY MOUTH EVERY DAY BEFORE BREAKFAST   midodrine (PROAMATINE) 10 MG tablet Take 1 tablet (10 mg total) by mouth 3 (three) times daily.   mirtazapine (REMERON) 15 MG tablet TAKE ONE TABLET BY MOUTH EVERY NIGHT AT BEDTIME   Multiple Vitamins-Minerals (MULTIVITAMIN WITH MINERALS) tablet Take 1 tablet by mouth daily.   nitroGLYCERIN (NITROSTAT) 0.4 MG SL tablet Place 1 tablet (0.4 mg total) under the tongue every 5 (five) minutes as needed for chest pain.   pantoprazole (PROTONIX) 40 MG tablet Take 1 tablet (40 mg total) by mouth 2 (two) times daily.   sucralfate (CARAFATE) 1 g tablet TAKE ONE TABLET BY MOUTH FOUR TIMES A DAY WITH MEALS AND AT BEDTIME   traMADol (ULTRAM) 50 MG tablet Take 50 mg by mouth 4 (four) times daily as needed.   triamcinolone cream (KENALOG) 0.1 % APPLY 1 APPLICATION TOPICALLY 2 (TWO) TIMES DAILY. AVOID FACE, GROIN, UNDERARMS. (Patient taking differently: Apply 1 application  topically 2 (two) times daily as needed (irritation). Avoid face, groin, underarms.)   zolpidem (AMBIEN) 10 MG tablet TAKE ONE TABLET BY MOUTH EVERY NIGHT AT BEDTIME AS NEEDED    Allergies:   Codeine and  Tape   Social History   Socioeconomic History   Marital status: Married    Spouse name: Not on file   Number of children: Not on file   Years of education: Not on file   Highest education level: Not on file  Occupational History   Occupation: retired Print production planner  Tobacco Use   Smoking status: Never   Smokeless tobacco: Never  Vaping Use   Vaping Use: Never used  Substance and Sexual Activity   Alcohol use: Never    Alcohol/week: 1.0 standard drink of alcohol    Types: 1  Cans of beer per week   Drug use: No   Sexual activity: Yes    Partners: Female    Birth control/protection: None  Other Topics Concern   Not on file  Social History Narrative   Regular exercise--yes--jog 5 miles 6 days a week   Social Determinants of Health   Financial Resource Strain: Low Risk  (02/26/2021)   Overall Financial Resource Strain (CARDIA)    Difficulty of Paying Living Expenses: Not hard at all  Food Insecurity: No Food Insecurity (02/26/2021)   Hunger Vital Sign    Worried About Running Out of Food in the Last Year: Never true    Ran Out of Food in the Last Year: Never true  Transportation Needs: No Transportation Needs (02/26/2021)   PRAPARE - Hydrologist (Medical): No    Lack of Transportation (Non-Medical): No  Physical Activity: Sufficiently Active (02/26/2021)   Exercise Vital Sign    Days of Exercise per Week: 7 days    Minutes of Exercise per Session: 50 min  Stress: No Stress Concern Present (02/26/2021)   [place]    Feeling of Stress : Not at all  Social Connections: Moderately Isolated (02/26/2021)   Social Connection and Isolation Panel [NHANES]    Frequency of Communication with Friends and Family: Twice a week    Frequency of Social Gatherings with Friends and Family: More than three times a week    Attends Religious Services: Never    Marine scientist or  Organizations: No    Attends Music therapist: Never    Marital Status: Married     Family History:  The patient's family history includes Cancer in his father; Coronary artery disease in his mother; Diabetes in his brother; Heart attack in his mother; Heart disease in his mother; Hypertension in his mother; Lung cancer in his father.  ROS:   12-point review of systems is negative unless otherwise noted in the HPI.   EKGs/Labs/Other Studies Reviewed:    Studies reviewed were summarized above. The additional studies were reviewed today:  Lexiscan MPI 09/22/2021:   The study is low risk.   There is no evidence of ischemia   Left ventricular function visually appears normal. Calculated EF is 39%. correlation with echo advised   LAD and RCA calcifications noted   Image quality degraded by diaphragmatic artifacts. __________  2D echo 08/19/2021: 1. Left ventricular ejection fraction, by estimation, is 50% . The left  ventricle has low normal function. The left ventricle demonstrates global  hypokinesis.   2. Right ventricular systolic function is normal. The right ventricular  size is normal. There is normal pulmonary artery systolic pressure. The  estimated right ventricular systolic pressure is 10.6 mmHg.   3. The mitral valve is normal in structure. Mild mitral valve  regurgitation. No evidence of mitral stenosis.   4. The aortic valve was not well visualized. Aortic valve regurgitation  is not visualized. No aortic stenosis is present.   5. The inferior vena cava is dilated in size with <50% respiratory  variability, suggesting right atrial pressure of 15 mmHg.   6. PVCs noted   Comparison(s): EF 60-65%. __________   Elwyn Reach patch 07/2021: Normal sinus rhythm Patient had a min HR of 40 bpm, max HR of 193 bpm, and avg HR of 68 bpm.    11 Supraventricular Tachycardia runs occurred, the run with the fastest interval lasting 4 beats  with a max rate of 193 bpm, the  longest lasting 15 beats with an avg rate of 111 bpm.  Junctional Rhythm was present.   IIsolated SVEs were occasional (1.7%, [id number]), SVE Couplets were rare (<1.0%, 72), and SVE Triplets were rare (<1.0%, 11). Isolated VEs were frequent (8.4%, [id number]), VE Couplets were rare (<1.0%, 8), and VE Triplets were rare (<1.0%, 1). Ventricular Bigeminy and Trigeminy were present.   Triggered events associated with PVCs __________   2D echo 11/30/2018: 1. The left ventricle has normal systolic function with an ejection  fraction of 60-65%. The cavity size was normal. Left ventricular diastolic  Doppler parameters are consistent with impaired relaxation. No evidence of  left ventricular regional wall  motion abnormalities.   2. The right ventricle has normal systolic function. The cavity was  normal. There is no increase in right ventricular wall thickness. Right  ventricular systolic pressure is normal with an estimated pressure of 21.3  mmHg. __________   Carlton Adam MPI 11/29/2018: There was no ST segment deviation noted during stress. No T wave inversion was noted during stress. The study is normal. This is a low risk study. The left ventricular ejection fraction is normal (55-65%). __________   2D echo 07/05/2017: - Left ventricle: The cavity size was normal. Wall thickness was    increased in a pattern of mild LVH. Systolic function was normal.    The estimated ejection fraction was in the range of 60% to 65%.    Wall motion was normal; there were no regional wall motion    abnormalities. Doppler parameters are consistent with abnormal    left ventricular relaxation (grade 1 diastolic dysfunction). The    E/e&' ratio is <8, suggesting normal LV filling pressure.  - Aorta: The sinus of valsalva is dilated at 4.0 cm.  - Left atrium: The atrium was normal in size.  - Right atrium: The atrium was at the upper limits of normal in    size.  - Tricuspid valve: There was trivial regurgitation.   - Pulmonary arteries: PA peak pressure: 22 mm Hg (S).  - Inferior vena cava: The vessel was normal in size. The    respirophasic diameter changes were in the normal range (= 50%),    consistent with normal central venous pressure.   Impressions:   - Compared to a prior study in 06/2016, the LVEF is higher at    60-65%. The aorta is dilated to 4.0 cm at the sinus of valsalva,    but measures 3.5 cm at the aortic root. __________   2D echo 06/24/2016: - Left ventricle: The cavity size was normal. There was mild    concentric hypertrophy. Systolic function was normal. The    estimated ejection fraction was in the range of 50% to 55%. There    is hypokinesis of the entireinferior myocardium. Left ventricular    diastolic function parameters were normal.  - Aortic valve: Trileaflet; mildly thickened, mildly calcified    leaflets.  - Aorta: Aortic root dimension: 38 mm (ED).  - Aortic root: The aortic root was mildly dilated.  - Mitral valve: Calcified annulus as well as calcified papillatry    muscle There was trivial regurgitation.  - Tricuspid valve: There was trivial regurgitation. __________   Outpatient cardiac monitoring 03/2016: Normal SInus Rhythm with the average heart rate 68bpm. Occasional ventricular couplets __________   Nuclear stress test 02/23/2016: Nuclear stress EF: 42%. Blood pressure demonstrated a normal response to exercise. There was no ST segment  deviation noted during stress. Defect 1: There is a medium defect of moderate severity present in the basal inferior and mid inferior location. The left ventricular ejection fraction is moderately decreased (30-44%). This is a low risk study.   Low risk stress nuclear study with inferior thinning vs prior infarct; no ischemia; EF 42 with global hypokinesis and moderate LVE. __________   Cardiac MRI 11/04/2013: IMPRESSION:  1.  Normal LV size and systolic function, EF 62%. 2.  Normal RV size and systolic  function. 3. No myocardial delayed enhancement noted, so no definitive  evidence for prior MI, myocarditis, or infiltrative disease. __________   2D echo 10/07/2013: - Left ventricle: The cavity size was normal. Wall thickness was    normal. Systolic function was moderately reduced. The estimated    ejection fraction was in the range of 35% to 40%. Moderate    hypokinesis of the entire myocardium.    EKG:  EKG is ordered today.  The EKG ordered today demonstrates NSR, 64 bpm, rare PAC, no acute ST-T changes  Recent Labs: 03/19/2021: TSH 1.32 07/30/2021: ALT 30 08/16/2021: Platelets 143 08/26/2021: BUN 16; Creatinine, Ser 1.24; Hemoglobin 12.4; Potassium 3.7; Sodium 140  Recent Lipid Panel    Component Value Date/Time   CHOL 128 03/19/2021 1015   CHOL 145 07/19/2016 0853   TRIG 46.0 03/19/2021 1015   HDL 73.70 03/19/2021 1015   HDL 70 07/19/2016 0853   CHOLHDL 2 03/19/2021 1015   VLDL 9.2 03/19/2021 1015   LDLCALC 45 03/19/2021 1015   LDLCALC 63 07/19/2016 0853   LDLDIRECT 57 06/22/2009 1228    PHYSICAL EXAM:    VS:  BP 90/62 (BP Location: Left Arm, Patient Position: Sitting, Cuff Size: Normal)   Pulse 64   Ht 6' (1.829 m)   Wt 163 lb 3.2 oz (74 kg)   SpO2 99%   BMI 22.13 kg/m   BMI: Body mass index is 22.13 kg/m.  Physical Exam Vitals reviewed.  Constitutional:      Appearance: He is well-developed.  HENT:     Head: Normocephalic and atraumatic.  Eyes:     General:        Right eye: No discharge.        Left eye: No discharge.  Neck:     Vascular: No JVD.  Cardiovascular:     Rate and Rhythm: Normal rate and regular rhythm.     Pulses:          Posterior tibial pulses are 2+ on the right side and 2+ on the left side.     Heart sounds: Normal heart sounds, S1 normal and S2 normal. Heart sounds not distant. No midsystolic click and no opening snap. No murmur heard.    No friction rub.  Pulmonary:     Effort: Pulmonary effort is normal. No respiratory distress.      Breath sounds: Normal breath sounds. No decreased breath sounds, wheezing or rales.  Chest:     Chest wall: No tenderness.  Abdominal:     General: There is no distension.  Musculoskeletal:     Cervical back: Normal range of motion.     Right lower leg: No edema.     Left lower leg: No edema.  Skin:    General: Skin is warm and dry.     Nails: There is no clubbing.  Neurological:     Mental Status: He is alert and oriented to person, place, and time.  Psychiatric:  Speech: Speech normal.        Behavior: Behavior normal.        Thought Content: Thought content normal.        Judgment: Judgment normal.     Wt Readings from Last 3 Encounters:  12/10/21 163 lb 3.2 oz (74 kg)  11/30/21 168 lb (76.2 kg)  10/19/21 175 lb (79.4 kg)     ASSESSMENT & PLAN:   Syncope felt to be related to orthostatic hypotension: No syncopal episodes in approximately the past 12 months.  Cardiac work-up has been reassuring including echo, cardiac MRI, and outpatient cardiac monitoring.  Add abdominal binder.  Continue compression stocks, fludrocortisone, and midodrine.  Liberalize salt and fluid intake.  Slow positional changes.  CAD involving the native coronary arteries without angina: He is doing well without symptoms concerning for angina or decompensation.  Continue risk factor modification and secondary prevention including clopidogrel and atorvastatin.  Not on beta-blocker secondary to significant underlying orthostatic hypotension with positional syncope.  PVCs: Quiescent.  Unable to add beta-blocker secondary to orthostatic hypotension.  EP evaluated the patient with recommendation for conservative therapy.  Recent echo demonstrated preserved LV systolic function.  Lexiscan MPI showed no evidence of ischemia.  HLD: LDL 45 in 03/2021.  He remains on atorvastatin 80 mg.  CKD stage III: Stable.  OSA: Status post Inspire device.   Disposition: F/u with Dr. Rockey Situ or an APP in 6  months.   Medication Adjustments/Labs and Tests Ordered: Current medicines are reviewed at length with the patient today.  Concerns regarding medicines are outlined above. Medication changes, Labs and Tests ordered today are summarized above and listed in the Patient Instructions accessible in Encounters.   Signed, Christell Faith, PA-C 12/10/2021 4:15 PM     [address] [place], [place] [address] [phone]

## 2021-12-14 ENCOUNTER — Telehealth: Payer: Self-pay | Admitting: Physician Assistant

## 2021-12-14 NOTE — Telephone Encounter (Signed)
Patient is calling stating that [facility] stated they do not do Elastic Bandages & Supports (ABDOMINAL BINDER/ELASTIC LARGE) MISC. Patient is requesting a callback to be informed what kind he should purchase due to this. Please advise.

## 2021-12-15 ENCOUNTER — Other Ambulatory Visit: Payer: Self-pay | Admitting: Family Medicine

## 2021-12-15 NOTE — Telephone Encounter (Signed)
Spoke with patient and reviewed that he can find the abdominal binder at [facility] him to please call back if he should have any further questions or needs. He verbalized understanding with no further questions at this time.

## 2021-12-20 NOTE — Assessment & Plan Note (Signed)
No red flags, no current indication for emergent imaging.

## 2021-12-20 NOTE — Assessment & Plan Note (Signed)
Stop muscle relaxant.  Complete prednisone course.  Limit head rotation.. consider cervical collar.  Call if headache is not improving for further evaluation  with possible head CT.

## 2021-12-20 NOTE — Assessment & Plan Note (Signed)
Chronic, inadequate control.   Decrease fluoxetine to 40 mg daily x 1 week then stop. Afterstopping fluoxetine,  start Cymbalta 30 mg daily, increase to 60 mg after 1 week

## 2021-12-23 ENCOUNTER — Telehealth: Payer: Self-pay

## 2021-12-23 NOTE — Progress Notes (Signed)
Chronic Care Management Pharmacy Assistant   Name: Shawn Lam  MRN: [id number] DOB: Jul 04, 1950  Reason for Encounter: CCM (Hypertension Disease State)  Recent office visits:  11/30/21 [name], MD Neck Pain Start: Duloxetine 30 mg then increase to 60 mg after 1 week Stop: Fluoxetine 40 mg Stop: Muscle Relaxant FU 1 week  Recent consult visits:  12/10/21 Christell Faith, PA (Cardiology) Orthostatic syncope Ordered: EKG FU 6 months 11/30/21 Lamount Cranker, MD Abnormal posture 11/05/21 Lamount Cranker, MD Cervical radiculopathy 11/01/21 Ramonita Lab, NP OSA FU 6 months 10/19/21 Colonoscopy 09/29/21 Munsoor Lateef, MD (Nephrology) FU 4 months  Hospital visits:  None since last CCM contact  Medications: Outpatient Encounter Medications as of 12/23/2021  Medication Sig   atorvastatin (LIPITOR) 80 MG tablet Take 1 tablet (80 mg total) by mouth daily.   busPIRone (BUSPAR) 10 MG tablet TAKE 2 TABLETS BY MOUTH 3 TIMES DAILY.   clopidogrel (PLAVIX) 75 MG tablet Take 1 tablet (75 mg total) by mouth daily.   denosumab (PROLIA) 60 MG/ML SOSY injection Inject 60 mg into the skin every 6 (six) months. Do not send RX, order this from the office   DULoxetine (CYMBALTA) 30 MG capsule 1 tablet daily x 1 week then increase to 2 tablets daily   Elastic Bandages & Supports (ABDOMINAL BINDER/ELASTIC LARGE) MISC 1 Units by Does not apply route daily.   ferrous sulfate 325 (65 FE) MG tablet TAKE 1 TABLET BY MOUTH 3 TIMES DAILY WITH MEALS.   finasteride (PROSCAR) 5 MG tablet Take 5 mg by mouth daily.   fludrocortisone (FLORINEF) 0.1 MG tablet Take 1 tablet (0.1 mg total) by mouth daily.   furosemide (LASIX) 20 MG tablet TAKE 1 TABLET BY MOUTH DAILY AS NEEDED FOR SWELLING AND SHORTNESS OF BREATH. TAKE SPARINGLY.   gabapentin (NEURONTIN) 100 MG capsule TAKE 2 CAPSULES BY MOUTH TWICE A DAY   levothyroxine (SYNTHROID) 75 MCG tablet TAKE 1 TABLET BY MOUTH EVERY DAY BEFORE BREAKFAST   midodrine (PROAMATINE)  10 MG tablet Take 1 tablet (10 mg total) by mouth 3 (three) times daily.   mirtazapine (REMERON) 15 MG tablet TAKE ONE TABLET BY MOUTH EVERY NIGHT AT BEDTIME   Multiple Vitamins-Minerals (MULTIVITAMIN WITH MINERALS) tablet Take 1 tablet by mouth daily.   nitroGLYCERIN (NITROSTAT) 0.4 MG SL tablet Place 1 tablet (0.4 mg total) under the tongue every 5 (five) minutes as needed for chest pain.   pantoprazole (PROTONIX) 40 MG tablet Take 1 tablet (40 mg total) by mouth 2 (two) times daily.   sucralfate (CARAFATE) 1 g tablet TAKE 1 TABLET BY MOUTH 4 TIMES A DAY WITH MEALS AND AT BEDTIME   traMADol (ULTRAM) 50 MG tablet Take 50 mg by mouth 4 (four) times daily as needed.   triamcinolone cream (KENALOG) 0.1 % APPLY 1 APPLICATION TOPICALLY 2 (TWO) TIMES DAILY. AVOID FACE, GROIN, UNDERARMS. (Patient taking differently: Apply 1 application  topically 2 (two) times daily as needed (irritation). Avoid face, groin, underarms.)   zolpidem (AMBIEN) 10 MG tablet TAKE ONE TABLET BY MOUTH EVERY NIGHT AT BEDTIME AS NEEDED   No facility-administered encounter medications on file as of 12/23/2021.    Recent Office Vitals: BP Readings from Last 3 Encounters:  12/10/21 90/62  11/30/21 130/66  10/19/21 (!) 147/84   Pulse Readings from Last 3 Encounters:  12/10/21 64  11/30/21 71  10/19/21 67    Wt Readings from Last 3 Encounters:  12/10/21 163 lb 3.2 oz (74 kg)  11/30/21 168 lb (  76.2 kg)  10/19/21 175 lb (79.4 kg)     Kidney Function Lab Results  Component Value Date/Time   CREATININE 1.24 08/26/2021 05:05 AM   CREATININE 1.49 (H) 08/16/2021 02:08 PM   GFR 43.20 (L) 07/30/2021 12:55 PM   GFRNONAA >60 08/26/2021 05:05 AM   GFRAA 60 (L) 01/08/2020 05:13 AM       Latest Ref Rng & Units 08/26/2021    5:05 AM 08/16/2021    2:08 PM 07/30/2021   12:55 PM  BMP  Glucose 70 - 99 mg/dL 128  86  109   BUN 8 - 23 mg/dL '16  13  14   '$ Creatinine 0.61 - 1.24 mg/dL 1.24  1.49  1.60   Sodium 135 - 145 mmol/L 140   144  143   Potassium 3.5 - 5.1 mmol/L 3.7  3.3  4.5   Chloride 98 - 111 mmol/L 107  110  109   CO2 22 - 32 mmol/L '28  27  26   '$ Calcium 8.9 - 10.3 mg/dL 8.1  8.5  8.5    Contacted patient on 12/28/2021 to discuss hypertension disease state  Current antihypertensive regimen:  Midodrine 10 mg TID  Fludrocortisone 0.1 mg daily   Patient verbally confirms he is taking the above medications as directed. Yes  How often are you checking your Blood Pressure? 1-2x per week  he checks his blood pressure in the morning before taking his medication.  I have asked patient to take their blood pressure daily and keep a log. Advised patient I would call back on 12/31/2021 for log. Patient verbalized understanding and agreed.   Wrist or arm cuff: Arm cuff Caffeine intake: 1 - 2 cups of coffee Salt intake: Cardiology told him to use more salt Over the counter medications including pseudoephedrine or NSAIDs? No  Any readings above 180/120? No  What recent interventions/DTPs have been made by any provider to improve Blood Pressure control since last CPP Visit: Cardiology advised patient to use more salt as his blood pressure was too low.   Any recent hospitalizations or ED visits since last visit with CPP? No  What diet changes have been made to improve Blood Pressure Control?  Cardiology advised patient to eat more salt as his blood pressure was too low.   What exercise is being done to improve your Blood Pressure Control?  Patient walks daily.   Adherence Review: Is the patient currently on ACE/ARB medication? No Does the patient have >5 day gap between last estimated fill dates? No  Summary of recommendations from last [place] visit (Date:09/16/2021)  Summary: CCM F/U visit -Reviewed medications; pt affirms compliance -Pt reports midodrine is cost-prohibitive (>$100) so he uses a coupon; per insurance formulary, 10 mg dose is Tier 4 but 5 mg dose is Tier 2 - he may qualify for tier  exception -Pt would like all of his medications consolidated with [facility] (clopidogrel and fluoxetine come through mail order currently)   Recommendations/Changes made from today's visit: -Pursue tier exception for midodrine 10 mg; if denied, can switch to 5 mg tablets (take 2 tab TID) for cost savings -Updated preferred pharmacy to [facility]: -[facility] will call patient with tier exception decision -Pharmacist follow up televisit scheduled for 1 year -PCP CPE 03/29/22  Star Rating Drugs:  Medication:  Last Fill: Day Supply Atorvastatin 80 mg 11/06/21 90  Care Gaps: Annual wellness visit in last year? Yes 02/26/2021 Most Recent BP reading:  90/62 on 12/10/2021  Upcoming appointments: PCP appointment on 12/28/2021 PCP 03/22/2022 for Lab  PCP 03/29/2022 for Physical and AWV CCM 09/14/2022  Charlene Brooke, CPP notified  Marijean Niemann, [facility] Assistant [phone]

## 2021-12-28 ENCOUNTER — Ambulatory Visit (INDEPENDENT_AMBULATORY_CARE_PROVIDER_SITE_OTHER): Payer: Medicare Other | Admitting: Family Medicine

## 2021-12-28 VITALS — BP 140/80 | HR 74 | Temp 97.4°F | Ht 73.0 in | Wt 161.4 lb

## 2021-12-28 DIAGNOSIS — G4452 New daily persistent headache (NDPH): Secondary | ICD-10-CM | POA: Insufficient documentation

## 2021-12-28 DIAGNOSIS — D509 Iron deficiency anemia, unspecified: Secondary | ICD-10-CM | POA: Diagnosis not present

## 2021-12-28 DIAGNOSIS — F411 Generalized anxiety disorder: Secondary | ICD-10-CM

## 2021-12-28 DIAGNOSIS — I2510 Atherosclerotic heart disease of native coronary artery without angina pectoris: Secondary | ICD-10-CM | POA: Diagnosis not present

## 2021-12-28 MED ORDER — DULOXETINE HCL 30 MG PO CPEP: ORAL_CAPSULE | ORAL | 3 refills | Status: DC

## 2021-12-28 NOTE — Assessment & Plan Note (Signed)
No further bleeding as led up to iron deficiency anemia.  He continues with 3 tablets iron daily.  We will reevaluate iron panel and CBC to determine if he needs to continue this.

## 2021-12-28 NOTE — Progress Notes (Signed)
Patient ID: Shawn Lam, male    DOB: 09/24/1950, 71 y.o.   MRN: [id number]  This visit was conducted in person.  BP (!) 140/80   Pulse 74   Temp (!) 97.4 F (36.3 C) (Temporal)   Ht '6\' 1"'$  (1.854 m)   Wt 161 lb 6 oz (73.2 kg)   SpO2 99%   BMI 21.29 kg/m    CC:  Chief Complaint  Patient presents with   Follow-up    Anxiety-Cymbalta start    Subjective:   HPI: Shawn Lam is a 71 y.o. male presenting on 12/28/2021 for Follow-up (Anxiety-Cymbalta start)  At last OV 11/30/2021 Weaned off fluoxetine and started Cymbalta 30 mg daily, followed by increase to 60 mg daily ( on this dose for 3-4 week).  In part chose Cymbalta given possible secondary neck pain benefit.   He feels that it is working better than the fluoxetine but not where he needs to be... 40% better overall.  No SE.  [id number]   S/P prednisone course of acute neck pain   X-ray reviewed :   no fracture, facet joint arthritis   He reports that headache and neck pain are not any better.  Gradually worsening.. some time 10/10. daily  Throbbing headache.  PT has not helped in last 3 weeks. Using 2 tylenol twice daily.  No proceeding fall or head injury. No slurred speech, no vision change.  No history of headache.  Relevant past medical, surgical, family and social history reviewed and updated as indicated. Interim medical history since our last visit reviewed. Allergies and medications reviewed and updated. Outpatient Medications Prior to Visit  Medication Sig Dispense Refill   atorvastatin (LIPITOR) 80 MG tablet Take 1 tablet (80 mg total) by mouth daily. 90 tablet 3   busPIRone (BUSPAR) 10 MG tablet TAKE 2 TABLETS BY MOUTH 3 TIMES DAILY. 540 tablet 1   clopidogrel (PLAVIX) 75 MG tablet Take 1 tablet (75 mg total) by mouth daily. 90 tablet 3   denosumab (PROLIA) 60 MG/ML SOSY injection Inject 60 mg into the skin every 6 (six) months. Do not send RX, order this from the office     DULoxetine  (CYMBALTA) 30 MG capsule 1 tablet daily x 1 week then increase to 2 tablets daily 60 capsule 3   Elastic Bandages & Supports (ABDOMINAL BINDER/ELASTIC LARGE) MISC 1 Units by Does not apply route daily. 1 each 1   ferrous sulfate 325 (65 FE) MG tablet TAKE 1 TABLET BY MOUTH 3 TIMES DAILY WITH MEALS. 270 tablet 3   finasteride (PROSCAR) 5 MG tablet Take 5 mg by mouth daily.     fludrocortisone (FLORINEF) 0.1 MG tablet Take 1 tablet (0.1 mg total) by mouth daily. 90 tablet 3   furosemide (LASIX) 20 MG tablet TAKE 1 TABLET BY MOUTH DAILY AS NEEDED FOR SWELLING AND SHORTNESS OF BREATH. TAKE SPARINGLY. 30 tablet 2   gabapentin (NEURONTIN) 100 MG capsule TAKE 2 CAPSULES BY MOUTH TWICE A DAY 120 capsule 1   levothyroxine (SYNTHROID) 75 MCG tablet TAKE 1 TABLET BY MOUTH EVERY DAY BEFORE BREAKFAST 90 tablet 3   midodrine (PROAMATINE) 10 MG tablet Take 1 tablet (10 mg total) by mouth 3 (three) times daily. 270 tablet 3   mirtazapine (REMERON) 15 MG tablet TAKE ONE TABLET BY MOUTH EVERY NIGHT AT BEDTIME 90 tablet 1   Multiple Vitamins-Minerals (MULTIVITAMIN WITH MINERALS) tablet Take 1 tablet by mouth daily.     nitroGLYCERIN (NITROSTAT) 0.4 MG  SL tablet Place 1 tablet (0.4 mg total) under the tongue every 5 (five) minutes as needed for chest pain. 10 tablet 1   pantoprazole (PROTONIX) 40 MG tablet Take 1 tablet (40 mg total) by mouth 2 (two) times daily. 180 tablet 0   sucralfate (CARAFATE) 1 g tablet TAKE 1 TABLET BY MOUTH 4 TIMES A DAY WITH MEALS AND AT BEDTIME 120 tablet 2   triamcinolone cream (KENALOG) 0.1 % APPLY 1 APPLICATION TOPICALLY 2 (TWO) TIMES DAILY. AVOID FACE, GROIN, UNDERARMS. (Patient taking differently: Apply 1 application  topically 2 (two) times daily as needed (irritation). Avoid face, groin, underarms.) 454 g 0   zolpidem (AMBIEN) 10 MG tablet TAKE ONE TABLET BY MOUTH EVERY NIGHT AT BEDTIME AS NEEDED 30 tablet 0   traMADol (ULTRAM) 50 MG tablet Take 50 mg by mouth 4 (four) times daily as  needed.     No facility-administered medications prior to visit.     Per HPI unless specifically indicated in ROS section below Review of Systems  Constitutional:  Positive for fatigue. Negative for fever.  HENT:  Negative for ear pain.   Eyes:  Negative for pain.  Respiratory:  Negative for cough and shortness of breath.   Cardiovascular:  Negative for chest pain, palpitations and leg swelling.  Gastrointestinal:  Negative for abdominal pain.  Genitourinary:  Negative for dysuria.  Musculoskeletal:  Negative for arthralgias.  Neurological:  Positive for light-headedness and headaches. Negative for syncope.  Psychiatric/Behavioral:  Negative for dysphoric mood.    Objective:  BP (!) 140/80   Pulse 74   Temp (!) 97.4 F (36.3 C) (Temporal)   Ht '6\' 1"'$  (1.854 m)   Wt 161 lb 6 oz (73.2 kg)   SpO2 99%   BMI 21.29 kg/m   Wt Readings from Last 3 Encounters:  12/28/21 161 lb 6 oz (73.2 kg)  12/10/21 163 lb 3.2 oz (74 kg)  11/30/21 168 lb (76.2 kg)      Physical Exam Constitutional:      Appearance: He is well-developed.  HENT:     Head: Normocephalic.     Right Ear: Hearing normal.     Left Ear: Hearing normal.     Nose: Nose normal.  Neck:     Thyroid: No thyroid mass or thyromegaly.     Vascular: No carotid bruit.     Trachea: Trachea normal.  Cardiovascular:     Rate and Rhythm: Normal rate and regular rhythm.     Pulses: Normal pulses.     Heart sounds: Heart sounds not distant. No murmur heard.    No friction rub. No gallop.     Comments: No peripheral edema Pulmonary:     Effort: Pulmonary effort is normal. No respiratory distress.     Breath sounds: Normal breath sounds.  Skin:    General: Skin is warm and dry.     Findings: No rash.  Neurological:     General: No focal deficit present.     Mental Status: He is oriented to person, place, and time.     Cranial Nerves: Cranial nerves 2-12 are intact.     Sensory: Sensation is intact.     Motor: Motor  function is intact.     Coordination: Coordination is intact.  Psychiatric:        Attention and Perception: Attention normal.        Mood and Affect: Mood is anxious.        Speech: Speech normal.  Behavior: Behavior normal.        Thought Content: Thought content normal.       Results for orders placed or performed during the hospital encounter of 10/19/21  Surgical pathology  Result Value Ref Range   SURGICAL PATHOLOGY      SURGICAL PATHOLOGY CASE: [id number] [phone] PATIENT: [name] Surgical Pathology Report     Specimen Submitted: A. Stomach; cbx B. Duodenum; cbx C. Colon polyp, cecum; cbx  Clinical History: Dysphagia R13.10, bloating R14.0, weight loss R63.4. Gastritis, colon polyp.      DIAGNOSIS: A.  STOMACH; COLD BIOPSY: - PATCHY MILD REACTIVE GASTRITIS. - NEGATIVE FOR H. PYLORI, INTESTINAL METAPLASIA, DYSPLASIA, AND MALIGNANCY.  B.  DUODENUM; COLD BIOPSY: - UNREMARKABLE DUODENAL MUCOSA. - NEGATIVE FOR DYSPLASIA AND MALIGNANCY.  C.  COLON POLYP, CECUM; COLD BIOPSY: - TUBULAR ADENOMA. - NEGATIVE FOR HIGH-GRADE DYSPLASIA AND MALIGNANCY.  GROSS DESCRIPTION: A. Labeled: Gastric cbxs for gastritis Received: Formalin Collection time: 9:36 AM on 10/19/2021 Placed into formalin time: 9:36 AM on 10/19/2021 Tissue fragment(s): 2 Size: Range from 0.3-0.4 cm Description: Tan soft tissue fragments Entirely submitted in 1 cassette.  B. Labeled: Duodenal cbxs for weight l oss Received: Formalin Collection time: 9:37 AM on 10/19/2021 Placed into formalin time: 9:37 AM on 10/19/2021 Tissue fragment(s): 3 Size: Aggregate, 0.5 x 0.4 x 0.2 cm Description: Received are fragments of tan soft tissue.  The smallest fragment may not survive processing. Entirely submitted in 1 cassette.  C. Labeled: Cecal polyp cbx Received: Formalin Collection time: 9:46 AM on 10/19/2021 Placed into formalin time: 9:46 AM on 10/19/2021 Tissue fragment(s): 3 Size: Aggregate,  0.7 x 0.3 x 0.2 cm Description: Tan soft tissue fragments Entirely submitted in 1 cassette.  RB 10/19/2021  Final Diagnosis performed by Quay Burow, MD.   Electronically signed 10/20/2021 1:28:59PM The electronic signature indicates that the named Attending Pathologist has evaluated the specimen Technical component performed at [facility], [address], [place], [place] [address] Lab: [phone] Dir: Rush Farmer, MD, MMM  Professional component performed at [facility], [facility], [address], [place], [place] [address] Lab: [phone] Dir: Kathi Simpers, MD      COVID 19 screen:  No recent travel or known exposure to [place] The patient denies respiratory symptoms of COVID 19 at this time. The importance of social distancing was discussed today.   Assessment and Plan    Problem List Items Addressed This Visit     Generalized anxiety disorder (Chronic)    Chronic, improved control with change from fluoxetine to Cymbalta.  He states he has noted 30 to 40% improvement on 60 mg of Cymbalta.  We will increase further to 90 mg p.o. daily.  He will call with update or follow-up in 4 weeks.      Relevant Medications   DULoxetine (CYMBALTA) 30 MG capsule   Iron deficiency anemia - Primary (Chronic)    No further bleeding as led up to iron deficiency anemia.  He continues with 3 tablets iron daily.  We will reevaluate iron panel and CBC to determine if he needs to continue this.      Relevant Orders   CBC with Differential/Platelet   IBC + Ferritin   New daily persistent headache    Per patient he has intermittent symptoms for a months but now has severe headache now occurring daily, progressively worsening over the last several months.  Normal neuro exam in office today.  Will evaluate with brain MRI to rule out secondary causes  of severe headache.  No new  medication association.  Possibly associated with his acute neck pain.  History of  osteoporosis.  Recent x-ray showed no fracture but did show facet arthritis.  Minimal improvement with Tylenol and Cymbalta.       Relevant Medications   DULoxetine (CYMBALTA) 30 MG capsule   Other Relevant Orders   MR Brain Wo Contrast     [name], MD

## 2021-12-28 NOTE — Assessment & Plan Note (Signed)
Chronic, improved control with change from fluoxetine to Cymbalta.  He states he has noted 30 to 40% improvement on 60 mg of Cymbalta.  We will increase further to 90 mg p.o. daily.  He will call with update or follow-up in 4 weeks.

## 2021-12-28 NOTE — Assessment & Plan Note (Signed)
Per patient he has intermittent symptoms for a months but now has severe headache now occurring daily, progressively worsening over the last several months.  Normal neuro exam in office today.  Will evaluate with brain MRI to rule out secondary causes of severe headache.  No new  medication association.  Possibly associated with his acute neck pain.  History of osteoporosis.  Recent x-ray showed no fracture but did show facet arthritis.  Minimal improvement with Tylenol and Cymbalta.

## 2021-12-29 ENCOUNTER — Other Ambulatory Visit: Payer: Self-pay | Admitting: Family Medicine

## 2021-12-29 LAB — CBC WITH DIFFERENTIAL/PLATELET
Basophils Absolute: 0 10*3/uL (ref 0.0–0.1)
Basophils Relative: 0.6 % (ref 0.0–3.0)
Eosinophils Absolute: 0.2 10*3/uL (ref 0.0–0.7)
Eosinophils Relative: 3.8 % (ref 0.0–5.0)
HCT: 40.7 % (ref 39.0–52.0)
Hemoglobin: 13.7 g/dL (ref 13.0–17.0)
Lymphocytes Relative: 31.2 % (ref 12.0–46.0)
Lymphs Abs: 1.7 10*3/uL (ref 0.7–4.0)
MCHC: 33.7 g/dL (ref 30.0–36.0)
MCV: 97.9 fl (ref 78.0–100.0)
Monocytes Absolute: 0.4 10*3/uL (ref 0.1–1.0)
Monocytes Relative: 7.8 % (ref 3.0–12.0)
Neutro Abs: 3.1 10*3/uL (ref 1.4–7.7)
Neutrophils Relative %: 56.6 % (ref 43.0–77.0)
Platelets: 198 10*3/uL (ref 150.0–400.0)
RBC: 4.16 Mil/uL — ABNORMAL LOW (ref 4.22–5.81)
RDW: 14.6 % (ref 11.5–15.5)
WBC: 5.4 10*3/uL (ref 4.0–10.5)

## 2021-12-29 LAB — IBC + FERRITIN
Ferritin: 236.8 ng/mL (ref 22.0–322.0)
Iron: 99 ug/dL (ref 42–165)
Saturation Ratios: 35.9 % (ref 20.0–50.0)
TIBC: 275.8 ug/dL (ref 250.0–450.0)
Transferrin: 197 mg/dL — ABNORMAL LOW (ref 212.0–360.0)

## 2021-12-29 NOTE — Telephone Encounter (Signed)
Last office visit 12/28/2021 for Iron deficiency anemia, Headache, GAD.  Last refilled 11/30/21 for #30 with no refills.  Next Appt: 01/27/22 for follow up.

## 2021-12-30 ENCOUNTER — Other Ambulatory Visit: Payer: Self-pay | Admitting: Family Medicine

## 2021-12-31 NOTE — Progress Notes (Signed)
Called patient for blood pressure log as previously discussed.   Date  Blood Pressure  09/22  140/83 09/21  91/65 09/20  112/51   Charlene Brooke, CPP notified Shawn Lam, [place] Clinical Pharmacy Assistant [phone]

## 2022-01-07 ENCOUNTER — Ambulatory Visit
Admission: RE | Admit: 2022-01-07 | Discharge: 2022-01-07 | Disposition: A | Discharge status: Home / Self Care | Payer: Medicare Other | Source: Ambulatory Visit | Attending: Family Medicine | Admitting: Family Medicine

## 2022-01-07 DIAGNOSIS — G4452 New daily persistent headache (NDPH): Secondary | ICD-10-CM | POA: Diagnosis not present

## 2022-01-07 DIAGNOSIS — R519 Headache, unspecified: Secondary | ICD-10-CM | POA: Diagnosis not present

## 2022-01-11 ENCOUNTER — Encounter: Payer: Self-pay | Admitting: Family Medicine

## 2022-01-12 ENCOUNTER — Other Ambulatory Visit: Payer: Self-pay | Admitting: *Deleted

## 2022-01-12 MED ORDER — GABAPENTIN 100 MG PO CAPS: 200.0000 mg | ORAL_CAPSULE | Freq: Two times a day (BID) | ORAL | 1 refills | Status: DC

## 2022-01-12 NOTE — Telephone Encounter (Signed)
Last office visit 12/28/21 for follow up GAD-Cymbalta start.  Last refilled 11/09/21 for #120 with 1 refill.  Next Appt: 01/27/22 for follow up.

## 2022-01-20 ENCOUNTER — Other Ambulatory Visit (INDEPENDENT_AMBULATORY_CARE_PROVIDER_SITE_OTHER): Payer: Medicare Other

## 2022-01-20 ENCOUNTER — Telehealth: Payer: Self-pay

## 2022-01-20 DIAGNOSIS — M810 Age-related osteoporosis without current pathological fracture: Secondary | ICD-10-CM

## 2022-01-20 NOTE — Telephone Encounter (Signed)
OOP cost is $0 Next injection due 01/14/22 or after Lab 10/12 and OV/NV 01/27/22

## 2022-01-21 LAB — BASIC METABOLIC PANEL
BUN: 16 mg/dL (ref 6–23)
CO2: 29 mEq/L (ref 19–32)
Calcium: 9.4 mg/dL (ref 8.4–10.5)
Chloride: 106 mEq/L (ref 96–112)
Creatinine, Ser: 1.53 mg/dL — ABNORMAL HIGH (ref 0.40–1.50)
GFR: 45.43 mL/min — ABNORMAL LOW (ref >60.00)
Glucose, Bld: 94 mg/dL (ref 70–99)
Potassium: 4.6 mEq/L (ref 3.5–5.1)
Sodium: 144 mEq/L (ref 135–145)

## 2022-01-27 ENCOUNTER — Encounter: Payer: Self-pay | Admitting: Family Medicine

## 2022-01-27 ENCOUNTER — Ambulatory Visit (INDEPENDENT_AMBULATORY_CARE_PROVIDER_SITE_OTHER): Payer: Medicare Other | Admitting: Family Medicine

## 2022-01-27 VITALS — BP 136/60 | HR 77 | Temp 98.2°F | Resp 16 | Ht 73.0 in | Wt 160.0 lb

## 2022-01-27 DIAGNOSIS — F411 Generalized anxiety disorder: Secondary | ICD-10-CM | POA: Diagnosis not present

## 2022-01-27 DIAGNOSIS — M810 Age-related osteoporosis without current pathological fracture: Secondary | ICD-10-CM

## 2022-01-27 DIAGNOSIS — D509 Iron deficiency anemia, unspecified: Secondary | ICD-10-CM

## 2022-01-27 DIAGNOSIS — G4452 New daily persistent headache (NDPH): Secondary | ICD-10-CM | POA: Diagnosis not present

## 2022-01-27 DIAGNOSIS — I2510 Atherosclerotic heart disease of native coronary artery without angina pectoris: Secondary | ICD-10-CM

## 2022-01-27 MED ORDER — DENOSUMAB 60 MG/ML ~~LOC~~ SOSY
60.0000 mg | PREFILLED_SYRINGE | Freq: Once | SUBCUTANEOUS | Status: AC
  Administered 2022-01-27: 60 mg via SUBCUTANEOUS

## 2022-01-27 NOTE — Patient Instructions (Addendum)
Stop mirtazapine and buspar. Okay to try weaning down pantoprazole.. 1./2 tablet daily, then wean off. Trial of wellbutrin in addition to cymbalta.

## 2022-01-27 NOTE — Assessment & Plan Note (Addendum)
Chronic, inadequate control Offered referral for counseling which I think would help the patient greatly but he is not interested at this point. He has had some limited improvement with Cymbalta 90 mg daily.  There was some miscommunication and it appears he has continued the BuSpar and Remeron.  I have suggested that he stop these.  We will try the addition of Wellbutrin as an adjunct to improve his depression and anxiety. I encouraged him to continue to work on stress reduction and relaxation techniques.  He does report some increased stress with family concerns regarding both his sons situations. I offered a medication such as Atarax for panic attacks but his symptoms are more generalized so I do not think this would be helpful. Close follow-up in 1 month.

## 2022-01-27 NOTE — Assessment & Plan Note (Signed)
MRI was unremarkable except for chronic sphenoid sinusitis.  Of note some fluid levels in the sphenoid sinus was seen on head CT in 2021. He has an upcoming appointment with ENT next week. If ENT does not feel that the sphenoid sinusitis could be causing his persistent headache we can consider again a cervicogenic cause of his pain.

## 2022-01-27 NOTE — Progress Notes (Signed)
Patient ID: Shawn Lam, male    DOB: 07-13-1950, 71 y.o.   MRN: [id number]  This visit was conducted in person.  BP 136/60   Pulse 77   Temp 98.2 F (36.8 C)   Resp 16   Ht '6\' 1"'$  (1.854 m)   Wt 160 lb (72.6 kg)   SpO2 98%   BMI 21.11 kg/m    CC:  Chief Complaint  Patient presents with   Anemia    Subjective:   HPI: Shawn Lam is a 71 y.o. male presenting on 01/27/2022 for Anemia  Generalized anxiety disorder: At last office visit 1 month ago he stated he had had improvement with the change from fluoxetine to Cymbalta.  At the last office visit we increased from 60 mg to 90 mg.  Today he reports  Iron deficiency anemia: Hemoglobin back in the normal range on December 28, 2021 hemoglobin 13.7.  No further bleeding. Iron panel shows normal total iron and ferritin with slightly low transferrin. He has decreased his iron to 1 tablet of 325 mg daily.   New daily persistent headache: Possibly associated to his acute neck pain. MRI brain from January 07, 2022 reviewed with patient.  Impression: No evidence of acute intracranial abnormality.  Mild chronic small ischemic changes within the white matter.  Left sphenoid sinusitis.   He continues to have pain in bilateral posterior head, more on left .[name] Severe. He has an appt with ENT  Relevant past medical, surgical, family and social history reviewed and updated as indicated. Interim medical history since our last visit reviewed. Allergies and medications reviewed and updated. Outpatient Medications Prior to Visit  Medication Sig Dispense Refill   atorvastatin (LIPITOR) 80 MG tablet Take 1 tablet (80 mg total) by mouth daily. 90 tablet 3   busPIRone (BUSPAR) 10 MG tablet TAKE TWO TABLETS BY MOUTH THREE TIMES A DAY 540 tablet 1   clopidogrel (PLAVIX) 75 MG tablet Take 1 tablet (75 mg total) by mouth daily. 90 tablet 3   denosumab (PROLIA) 60 MG/ML SOSY injection Inject 60 mg into the skin every 6 (six) months. Do not  send RX, order this from the office     DULoxetine (CYMBALTA) 30 MG capsule 3 capsules  po daily 90 capsule 3   Elastic Bandages & Supports (ABDOMINAL BINDER/ELASTIC LARGE) MISC 1 Units by Does not apply route daily. 1 each 1   ferrous sulfate 325 (65 FE) MG tablet TAKE 1 TABLET BY MOUTH 3 TIMES DAILY WITH MEALS. 270 tablet 3   finasteride (PROSCAR) 5 MG tablet Take 5 mg by mouth daily.     fludrocortisone (FLORINEF) 0.1 MG tablet Take 1 tablet (0.1 mg total) by mouth daily. 90 tablet 3   furosemide (LASIX) 20 MG tablet TAKE 1 TABLET BY MOUTH DAILY AS NEEDED FOR SWELLING AND SHORTNESS OF BREATH. TAKE SPARINGLY. 30 tablet 2   gabapentin (NEURONTIN) 100 MG capsule Take 2 capsules (200 mg total) by mouth 2 (two) times daily. 120 capsule 1   levothyroxine (SYNTHROID) 75 MCG tablet TAKE 1 TABLET BY MOUTH EVERY DAY BEFORE BREAKFAST 90 tablet 3   midodrine (PROAMATINE) 10 MG tablet Take 1 tablet (10 mg total) by mouth 3 (three) times daily. 270 tablet 3   mirtazapine (REMERON) 15 MG tablet TAKE ONE TABLET BY MOUTH EVERY NIGHT AT BEDTIME 90 tablet 1   Multiple Vitamins-Minerals (MULTIVITAMIN WITH MINERALS) tablet Take 1 tablet by mouth daily.     nitroGLYCERIN (NITROSTAT) 0.4 MG  SL tablet Place 1 tablet (0.4 mg total) under the tongue every 5 (five) minutes as needed for chest pain. 10 tablet 1   pantoprazole (PROTONIX) 40 MG tablet Take 1 tablet (40 mg total) by mouth 2 (two) times daily. 180 tablet 0   sucralfate (CARAFATE) 1 g tablet TAKE 1 TABLET BY MOUTH 4 TIMES A DAY WITH MEALS AND AT BEDTIME 120 tablet 2   triamcinolone cream (KENALOG) 0.1 % APPLY 1 APPLICATION TOPICALLY 2 (TWO) TIMES DAILY. AVOID FACE, GROIN, UNDERARMS. (Patient taking differently: Apply 1 application  topically 2 (two) times daily as needed (irritation). Avoid face, groin, underarms.) 454 g 0   zolpidem (AMBIEN) 10 MG tablet TAKE ONE TABLET BY MOUTH EVERY NIGHT AT BEDTIME AS NEEDED 30 tablet 0   No facility-administered  medications prior to visit.     Per HPI unless specifically indicated in ROS section below Review of Systems  Constitutional:  Negative for fatigue and fever.  HENT:  Negative for ear pain.   Eyes:  Negative for pain.  Respiratory:  Negative for cough and shortness of breath.   Cardiovascular:  Negative for chest pain, palpitations and leg swelling.  Gastrointestinal:  Negative for abdominal pain.  Genitourinary:  Negative for dysuria.  Musculoskeletal:  Negative for arthralgias.  Neurological:  Positive for headaches. Negative for syncope and light-headedness.  Psychiatric/Behavioral:  Negative for dysphoric mood.    Objective:  BP 136/60   Pulse 77   Temp 98.2 F (36.8 C)   Resp 16   Ht '6\' 1"'$  (1.854 m)   Wt 160 lb (72.6 kg)   SpO2 98%   BMI 21.11 kg/m   Wt Readings from Last 3 Encounters:  01/27/22 160 lb (72.6 kg)  12/28/21 161 lb 6 oz (73.2 kg)  12/10/21 163 lb 3.2 oz (74 kg)      Physical Exam Constitutional:      Appearance: He is well-developed.  HENT:     Head: Normocephalic.     Right Ear: Hearing normal.     Left Ear: Hearing normal.     Nose: Nose normal.  Neck:     Thyroid: No thyroid mass or thyromegaly.     Vascular: No carotid bruit.     Trachea: Trachea normal.  Cardiovascular:     Rate and Rhythm: Normal rate and regular rhythm.     Pulses: Normal pulses.     Heart sounds: Heart sounds not distant. No murmur heard.    No friction rub. No gallop.     Comments: No peripheral edema Pulmonary:     Effort: Pulmonary effort is normal. No respiratory distress.     Breath sounds: Normal breath sounds.  Musculoskeletal:     Cervical back: Tenderness and bony tenderness present. No spasms. No pain with movement. Normal range of motion.  Skin:    General: Skin is warm and dry.     Findings: No rash.  Psychiatric:        Speech: Speech normal.        Behavior: Behavior normal.        Thought Content: Thought content normal.       Results for  orders placed or performed in visit on 72/53/66  Basic Metabolic Panel  Result Value Ref Range   Sodium 144 135 - 145 mEq/L   Potassium 4.6 3.5 - 5.1 mEq/L   Chloride 106 96 - 112 mEq/L   CO2 29 19 - 32 mEq/L   Glucose, Bld 94 70 - 99  mg/dL   BUN 16 6 - 23 mg/dL   Creatinine, Ser 1.53 (H) 0.40 - 1.50 mg/dL   GFR 45.43 (L) >60.00 mL/min   Calcium 9.4 8.4 - 10.5 mg/dL     COVID 19 screen:  No recent travel or known exposure to COVID19 The patient denies respiratory symptoms of COVID 19 at this time. The importance of social distancing was discussed today.   Assessment and Plan    Problem List Items Addressed This Visit     Generalized anxiety disorder (Chronic)    Chronic, inadequate control Offered referral for counseling which I think would help the patient greatly but he is not interested at this point. He has had some limited improvement with Cymbalta 90 mg daily.  There was some miscommunication and it appears he has continued the BuSpar and Remeron.  I have suggested that he stop these.  We will try the addition of Wellbutrin as an adjunct to improve his depression and anxiety. I encouraged him to continue to work on stress reduction and relaxation techniques.  He does report some increased stress with family concerns regarding both his sons situations. I offered a medication such as Atarax for panic attacks but his symptoms are more generalized so I do not think this would be helpful. Close follow-up in 1 month.      Anemia, iron deficiency    Hemoglobin now in the normal range, resolved Iron levels improved with only low transferrin.  He will continue iron 325 mg daily for the next month.  We should recheck his iron levels to determine if he can stop this at that point.      New daily persistent headache    MRI was unremarkable except for chronic sphenoid sinusitis.  Of note some fluid levels in the sphenoid sinus was seen on head CT in 2021. He has an upcoming  appointment with ENT next week. If ENT does not feel that the sphenoid sinusitis could be causing his persistent headache we can consider again a cervicogenic cause of his pain.      Osteoporosis without current pathological fracture - Primary  Given prolia injection in office today.   [name], MD

## 2022-01-27 NOTE — Assessment & Plan Note (Signed)
Hemoglobin now in the normal range, resolved Iron levels improved with only low transferrin.  He will continue iron 325 mg daily for the next month.  We should recheck his iron levels to determine if he can stop this at that point.

## 2022-01-28 ENCOUNTER — Telehealth: Payer: Self-pay | Admitting: Family Medicine

## 2022-01-28 MED ORDER — BUPROPION HCL ER (XL) 150 MG PO TB24: 150.0000 mg | ORAL_TABLET | Freq: Every day | ORAL | 3 refills | Status: DC

## 2022-01-28 NOTE — Telephone Encounter (Signed)
Shawn Lam notified by telephone that Dr. Diona Browner has sent in his prescription for the Wellbutrin to his pharmacy.

## 2022-01-28 NOTE — Telephone Encounter (Signed)
Patient would like to know if his rx for wellbutrin was called in for him on yesterday,per dr Diona Browner, he called the pharmacy but they haven't received it yet. He is following up on the status of it.

## 2022-01-28 NOTE — Telephone Encounter (Signed)
Prescription has been sent in  

## 2022-01-31 ENCOUNTER — Other Ambulatory Visit: Payer: Self-pay | Admitting: Family Medicine

## 2022-01-31 DIAGNOSIS — R809 Proteinuria, unspecified: Secondary | ICD-10-CM | POA: Diagnosis not present

## 2022-01-31 DIAGNOSIS — N1831 Chronic kidney disease, stage 3a: Secondary | ICD-10-CM | POA: Diagnosis not present

## 2022-01-31 DIAGNOSIS — N2581 Secondary hyperparathyroidism of renal origin: Secondary | ICD-10-CM | POA: Diagnosis not present

## 2022-01-31 NOTE — Telephone Encounter (Signed)
Last office visit 01/27/22 for anemia.  Last refilled 12/29/21 for #30 with no refills.  Next Appt: 02/24/22 for HA and Mood.

## 2022-02-03 DIAGNOSIS — J301 Allergic rhinitis due to pollen: Secondary | ICD-10-CM | POA: Diagnosis not present

## 2022-02-03 DIAGNOSIS — R519 Headache, unspecified: Secondary | ICD-10-CM | POA: Diagnosis not present

## 2022-02-03 DIAGNOSIS — J323 Chronic sphenoidal sinusitis: Secondary | ICD-10-CM | POA: Diagnosis not present

## 2022-02-04 ENCOUNTER — Telehealth: Payer: Self-pay

## 2022-02-04 NOTE — Telephone Encounter (Signed)
[facility] - Client Nonclinical Telephone Record  [name] - Client Client Site [place] - Night Provider [name] - MD Contact Type Call Who Is Calling Patient / Member / Family / Caregiver Caller Name [place] Phone Number [phone] Patient Name Shawn Lam Patient DOB [birth year] Call Type Message Only Information Provided Reason for Call Request to Reschedule Office Appointment Initial Comment Caller states he is calling to reschedule an appointment. Patient request to speak to RN No Additional Comment Provided office hours. Disp. Time Disposition Final User 02/03/2022 5:01:51 PM General Information Provided Yes [name], [place] Call Closed By: [name] Transaction Date/Time: 02/03/2022 4:59:46 PM (ET

## 2022-02-14 ENCOUNTER — Ambulatory Visit: Payer: Medicare Other | Admitting: Dermatology

## 2022-02-22 ENCOUNTER — Ambulatory Visit: Payer: Medicare Other | Admitting: Family Medicine

## 2022-02-24 ENCOUNTER — Ambulatory Visit: Payer: Medicare Other | Admitting: Family Medicine

## 2022-02-24 DIAGNOSIS — J323 Chronic sphenoidal sinusitis: Secondary | ICD-10-CM | POA: Diagnosis not present

## 2022-02-24 DIAGNOSIS — R519 Headache, unspecified: Secondary | ICD-10-CM | POA: Diagnosis not present

## 2022-02-28 ENCOUNTER — Other Ambulatory Visit: Payer: Self-pay | Admitting: Family Medicine

## 2022-03-01 NOTE — Telephone Encounter (Signed)
Last office visit 01/27/22 for Anemia/GAD/Osteoporosis.  Last refilled 02/01/22 for #30 with no refills.  Next Appt: 03/08/22 for follow up mood and headaches.

## 2022-03-08 ENCOUNTER — Ambulatory Visit (INDEPENDENT_AMBULATORY_CARE_PROVIDER_SITE_OTHER): Payer: Medicare Other | Admitting: Family Medicine

## 2022-03-08 ENCOUNTER — Encounter: Payer: Self-pay | Admitting: Family Medicine

## 2022-03-08 VITALS — BP 130/72 | HR 68 | Temp 97.7°F | Ht 73.0 in | Wt 152.2 lb

## 2022-03-08 DIAGNOSIS — F411 Generalized anxiety disorder: Secondary | ICD-10-CM | POA: Diagnosis not present

## 2022-03-08 DIAGNOSIS — J0130 Acute sphenoidal sinusitis, unspecified: Secondary | ICD-10-CM | POA: Diagnosis not present

## 2022-03-08 DIAGNOSIS — G4486 Cervicogenic headache: Secondary | ICD-10-CM

## 2022-03-08 DIAGNOSIS — I2510 Atherosclerotic heart disease of native coronary artery without angina pectoris: Secondary | ICD-10-CM | POA: Diagnosis not present

## 2022-03-08 MED ORDER — BUPROPION HCL ER (XL) 150 MG PO TB24: 300.0000 mg | ORAL_TABLET | Freq: Every day | ORAL | 3 refills | Status: DC

## 2022-03-08 NOTE — Patient Instructions (Addendum)
Increase Wellbutrin XL to  150 mg in AM and another dose around lunchtime.  Call if you have continued issues with referral to neurology.

## 2022-03-08 NOTE — Assessment & Plan Note (Signed)
Chronic, improved control off of BuSpar and Remeron with addition of Wellbutrin.  He feels like symptoms are significantly better in the morning but started to worsen later in the day.  I have suggested he increase the Wellbutrin XL to 150 twice daily (morning and early afternoon). Continue Cymbalta 90 mg daily

## 2022-03-08 NOTE — Assessment & Plan Note (Signed)
Seen by ENT.  Treated with antibiotics and prednisone with improvement of sinusitis on MRI repeat.  No improvement in patient's headache symptoms.

## 2022-03-08 NOTE — Assessment & Plan Note (Signed)
Chronic, reviewed MRI and ENT follow-up.  Pain most likely secondary to cervical spine pain, referred.  He has pending referral to neurology for further treatment.

## 2022-03-08 NOTE — Progress Notes (Signed)
Patient ID: ANIS DEGIDIO, male    DOB: 06-Nov-1950, 71 y.o.   MRN: [id number]  This visit was conducted in person.  BP 130/72   Pulse 68   Temp 97.7 F (36.5 C) (Oral)   Ht '6\' 1"'$  (1.854 m)   Wt 152 lb 4 oz (69.1 kg)   SpO2 98%   BMI 20.09 kg/m    CC:  Chief Complaint  Patient presents with   Follow-up    Headaches/Mood    Subjective:   HPI: KELDRIC POYER is a 71 y.o. male presenting on 03/08/2022 for Follow-up (Headaches/Mood)  Follow up on recent new daily headache felt possibly to be due to history of cervical neck pain. MRI brain January 07, 2022: No evidence of acute intracranial abnormality. Mild chronic small ischemic changes within the white matter. Left sphenoid sinusitis.  He saw ENT at the end of October, 2023.[name]  Per patient report  he was started on prednisone and antibiotics.  Fluid resolved on repeat MRI.  No improvement of headache.    At last office visit General anxiety disorder was discussed in detail.  I had offered counseling but he is not interested.  He had some limited improvement with Cymbalta 90 mg daily.  He was told to stop BuSpar and Remeron and try instead added Wellbutrin as an adjunct. Today he reports he has noted improvement in  mood , less worry in morning but seem to come back later in the day.  No SE,  some improved energy.  Sleeping well at night.   GAD7: 17  PHQ9: 12    Relevant past medical, surgical, family and social history reviewed and updated as indicated. Interim medical history since our last visit reviewed. Allergies and medications reviewed and updated. Outpatient Medications Prior to Visit  Medication Sig Dispense Refill   atorvastatin (LIPITOR) 80 MG tablet Take 1 tablet (80 mg total) by mouth daily. 90 tablet 3   buPROPion (WELLBUTRIN XL) 150 MG 24 hr tablet Take 1 tablet (150 mg total) by mouth daily. 30 tablet 3   clopidogrel (PLAVIX) 75 MG tablet Take 1 tablet (75 mg total) by mouth daily. 90 tablet 3    denosumab (PROLIA) 60 MG/ML SOSY injection Inject 60 mg into the skin every 6 (six) months. Do not send RX, order this from the office     DULoxetine (CYMBALTA) 30 MG capsule 3 capsules  po daily 90 capsule 3   Elastic Bandages & Supports (ABDOMINAL BINDER/ELASTIC LARGE) MISC 1 Units by Does not apply route daily. 1 each 1   ferrous sulfate 325 (65 FE) MG tablet TAKE 1 TABLET BY MOUTH 3 TIMES DAILY WITH MEALS. 270 tablet 3   finasteride (PROSCAR) 5 MG tablet Take 5 mg by mouth daily.     furosemide (LASIX) 20 MG tablet TAKE 1 TABLET BY MOUTH DAILY AS NEEDED FOR SWELLING AND SHORTNESS OF BREATH. TAKE SPARINGLY. 30 tablet 2   gabapentin (NEURONTIN) 100 MG capsule Take 2 capsules (200 mg total) by mouth 2 (two) times daily. 120 capsule 1   levothyroxine (SYNTHROID) 75 MCG tablet TAKE 1 TABLET BY MOUTH EVERY DAY BEFORE BREAKFAST 90 tablet 3   midodrine (PROAMATINE) 10 MG tablet Take 1 tablet (10 mg total) by mouth 3 (three) times daily. 270 tablet 3   Multiple Vitamins-Minerals (MULTIVITAMIN WITH MINERALS) tablet Take 1 tablet by mouth daily.     nitroGLYCERIN (NITROSTAT) 0.4 MG SL tablet Place 1 tablet (0.4 mg total) under the tongue  every 5 (five) minutes as needed for chest pain. 10 tablet 1   sucralfate (CARAFATE) 1 g tablet TAKE 1 TABLET BY MOUTH 4 TIMES A DAY WITH MEALS AND AT BEDTIME 120 tablet 2   triamcinolone cream (KENALOG) 0.1 % APPLY 1 APPLICATION TOPICALLY 2 (TWO) TIMES DAILY. AVOID FACE, GROIN, UNDERARMS. (Patient taking differently: Apply 1 application  topically 2 (two) times daily as needed (irritation). Avoid face, groin, underarms.) 454 g 0   zolpidem (AMBIEN) 10 MG tablet TAKE ONE TABLET BY MOUTH EVERY NIGHT AT BEDTIME AS NEEDED 30 tablet 0   No facility-administered medications prior to visit.     Per HPI unless specifically indicated in ROS section below Review of Systems  Constitutional:  Negative for fatigue and fever.  HENT:  Negative for ear pain.   Eyes:  Negative for  pain.  Respiratory:  Negative for cough and shortness of breath.   Cardiovascular:  Negative for chest pain, palpitations and leg swelling.  Gastrointestinal:  Negative for abdominal pain.  Genitourinary:  Negative for dysuria.  Musculoskeletal:  Negative for arthralgias.  Neurological:  Negative for syncope, light-headedness and headaches.  Psychiatric/Behavioral:  Negative for dysphoric mood.    Objective:  BP 130/72   Pulse 68   Temp 97.7 F (36.5 C) (Oral)   Ht '6\' 1"'$  (1.854 m)   Wt 152 lb 4 oz (69.1 kg)   SpO2 98%   BMI 20.09 kg/m   Wt Readings from Last 3 Encounters:  03/08/22 152 lb 4 oz (69.1 kg)  01/27/22 160 lb (72.6 kg)  12/28/21 161 lb 6 oz (73.2 kg)      Physical Exam Constitutional:      Appearance: He is well-developed.  HENT:     Head: Normocephalic.     Right Ear: Hearing normal.     Left Ear: Hearing normal.     Nose: Nose normal.  Neck:     Thyroid: No thyroid mass or thyromegaly.     Vascular: No carotid bruit.     Trachea: Trachea normal.  Cardiovascular:     Rate and Rhythm: Normal rate and regular rhythm.     Pulses: Normal pulses.     Heart sounds: Heart sounds not distant. No murmur heard.    No friction rub. No gallop.     Comments: No peripheral edema Pulmonary:     Effort: Pulmonary effort is normal. No respiratory distress.     Breath sounds: Normal breath sounds.  Musculoskeletal:     Cervical back: Tenderness and bony tenderness present. Pain with movement present. Decreased range of motion.  Skin:    General: Skin is warm and dry.     Findings: No rash.  Psychiatric:        Speech: Speech normal.        Behavior: Behavior normal.        Thought Content: Thought content normal.       Results for orders placed or performed in visit on 37/90/24  Basic Metabolic Panel  Result Value Ref Range   Sodium 144 135 - 145 mEq/L   Potassium 4.6 3.5 - 5.1 mEq/L   Chloride 106 96 - 112 mEq/L   CO2 29 19 - 32 mEq/L   Glucose, Bld 94 70 -  99 mg/dL   BUN 16 6 - 23 mg/dL   Creatinine, Ser 1.53 (H) 0.40 - 1.50 mg/dL   GFR 45.43 (L) >60.00 mL/min   Calcium 9.4 8.4 - 10.5 mg/dL  COVID 19 screen:  No recent travel or known exposure to COVID19 The patient denies respiratory symptoms of COVID 19 at this time. The importance of social distancing was discussed today.   Assessment and Plan    Problem List Items Addressed This Visit     Generalized anxiety disorder (Chronic)    Chronic, improved control off of BuSpar and Remeron with addition of Wellbutrin.  He feels like symptoms are significantly better in the morning but started to worsen later in the day.  I have suggested he increase the Wellbutrin XL to 150 twice daily (morning and early afternoon). Continue Cymbalta 90 mg daily      Relevant Medications   buPROPion (WELLBUTRIN XL) 150 MG 24 hr tablet   Cervicogenic headache - Primary    Chronic, reviewed MRI and ENT follow-up.  Pain most likely secondary to cervical spine pain, referred.  He has pending referral to neurology for further treatment.      Relevant Medications   buPROPion (WELLBUTRIN XL) 150 MG 24 hr tablet   Subacute sphenoidal sinusitis    Seen by ENT.  Treated with antibiotics and prednisone with improvement of sinusitis on MRI repeat.  No improvement in patient's headache symptoms.        [name], MD

## 2022-03-09 ENCOUNTER — Encounter: Payer: Self-pay | Admitting: Family Medicine

## 2022-03-14 ENCOUNTER — Telehealth: Payer: Self-pay | Admitting: Family Medicine

## 2022-03-14 ENCOUNTER — Other Ambulatory Visit: Payer: Self-pay | Admitting: Family Medicine

## 2022-03-14 DIAGNOSIS — E039 Hypothyroidism, unspecified: Secondary | ICD-10-CM

## 2022-03-14 DIAGNOSIS — D509 Iron deficiency anemia, unspecified: Secondary | ICD-10-CM

## 2022-03-14 DIAGNOSIS — I25118 Atherosclerotic heart disease of native coronary artery with other forms of angina pectoris: Secondary | ICD-10-CM

## 2022-03-14 NOTE — Telephone Encounter (Signed)
Last office visit 03/08/22 for cervicogenic headache, GAD and sinusitis.  Last refilled 01/12/22 for #120 with 1 refill.  CPE scheduled for 03/29/22.

## 2022-03-14 NOTE — Telephone Encounter (Signed)
-----   Message from Velna Hatchet, RT sent at 03/07/2022 10:07 AM EST ----- Regarding: Tue 12/12 lab Lab orders needed for appt on 12/12 for Medicare well visit, please.  Thanks, Anda Kraft

## 2022-03-17 ENCOUNTER — Ambulatory Visit (INDEPENDENT_AMBULATORY_CARE_PROVIDER_SITE_OTHER): Payer: Medicare Other | Admitting: Dermatology

## 2022-03-17 DIAGNOSIS — L814 Other melanin hyperpigmentation: Secondary | ICD-10-CM | POA: Diagnosis not present

## 2022-03-17 DIAGNOSIS — D229 Melanocytic nevi, unspecified: Secondary | ICD-10-CM

## 2022-03-17 DIAGNOSIS — L578 Other skin changes due to chronic exposure to nonionizing radiation: Secondary | ICD-10-CM | POA: Diagnosis not present

## 2022-03-17 DIAGNOSIS — L820 Inflamed seborrheic keratosis: Secondary | ICD-10-CM | POA: Diagnosis not present

## 2022-03-17 DIAGNOSIS — L821 Other seborrheic keratosis: Secondary | ICD-10-CM

## 2022-03-17 DIAGNOSIS — L570 Actinic keratosis: Secondary | ICD-10-CM

## 2022-03-17 DIAGNOSIS — Z1283 Encounter for screening for malignant neoplasm of skin: Secondary | ICD-10-CM | POA: Diagnosis not present

## 2022-03-17 DIAGNOSIS — I2510 Atherosclerotic heart disease of native coronary artery without angina pectoris: Secondary | ICD-10-CM | POA: Diagnosis not present

## 2022-03-17 DIAGNOSIS — D692 Other nonthrombocytopenic purpura: Secondary | ICD-10-CM

## 2022-03-17 NOTE — Patient Instructions (Addendum)
Cryotherapy Aftercare  Wash gently with soap and water everyday.   Apply Vaseline and Band-Aid daily until healed.     Due to recent changes in healthcare laws, you may see results of your pathology and/or laboratory studies on MyChart before the doctors have had a chance to review them. We understand that in some cases there may be results that are confusing or concerning to you. Please understand that not all results are received at the same time and often the doctors may need to interpret multiple results in order to provide you with the best plan of care or course of treatment. Therefore, we ask that you please give us 2 business days to thoroughly review all your results before contacting the office for clarification. Should we see a critical lab result, you will be contacted sooner.   If You Need Anything After Your Visit  If you have any questions or concerns for your doctor, please call our main line at [phone] and press option 4 to reach your doctor's medical assistant. If no one answers, please leave a voicemail as directed and we will return your call as soon as possible. Messages left after 4 pm will be answered the following business day.   You may also send us a message via MyChart. We typically respond to MyChart messages within 1-2 business days.  For prescription refills, please ask your pharmacy to contact our office. Our fax number is [phone].  If you have an urgent issue when the clinic is closed that cannot wait until the next business day, you can page your doctor at the number below.    Please note that while we do our best to be available for urgent issues outside of office hours, we are not available 24/7.   If you have an urgent issue and are unable to reach us, you may choose to seek medical care at your doctor's office, retail clinic, urgent care center, or emergency room.  If you have a medical emergency, please immediately call 911 or go to the  emergency department.  Pager Numbers  - Dr. Kowalski: [phone]  - Dr. Moye: [phone]  - Dr. Stewart: [phone]  In the event of inclement weather, please call our main line at [phone] for an update on the status of any delays or closures.  Dermatology Medication Tips: Please keep the boxes that topical medications come in in order to help keep track of the instructions about where and how to use these. Pharmacies typically print the medication instructions only on the boxes and not directly on the medication tubes.   If your medication is too expensive, please contact our office at [phone] option 4 or send us a message through MyChart.   We are unable to tell what your co-pay for medications will be in advance as this is different depending on your insurance coverage. However, we may be able to find a substitute medication at lower cost or fill out paperwork to get insurance to cover a needed medication.   If a prior authorization is required to get your medication covered by your insurance company, please allow us 1-2 business days to complete this process.  Drug prices often vary depending on where the prescription is filled and some pharmacies may offer cheaper prices.  The website www.goodrx.com contains coupons for medications through different pharmacies. The prices here do not account for what the cost may be with help from insurance (it may be cheaper with your insurance), but the website can   give you the price if you did not use any insurance.  - You can print the associated coupon and take it with your prescription to the pharmacy.  - You may also stop by our office during regular business hours and pick up a GoodRx coupon card.  - If you need your prescription sent electronically to a different pharmacy, notify our office through [facility] MyChart or by phone at [phone] option 4.     Si Usted Necesita Algo Despus de Su Visita  Tambin puede  enviarnos un mensaje a travs de MyChart. Por lo general respondemos a los mensajes de MyChart en el transcurso de 1 a 2 das hbiles.  Para renovar recetas, por favor pida a su farmacia que se ponga en contacto con nuestra oficina. Nuestro nmero de fax es el [phone].  Si tiene un asunto urgente cuando la clnica est cerrada y que no puede esperar hasta el siguiente da hbil, puede llamar/localizar a su doctor(a) al nmero que aparece a continuacin.   Por favor, tenga en cuenta que aunque hacemos todo lo posible para estar disponibles para asuntos urgentes fuera del horario de oficina, no estamos disponibles las 24 horas del da, los 7 das de la semana.   Si tiene un problema urgente y no puede comunicarse con nosotros, puede optar por buscar atencin mdica  en el consultorio de su doctor(a), en una clnica privada, en un centro de atencin urgente o en una sala de emergencias.  Si tiene una emergencia mdica, por favor llame inmediatamente al 911 o vaya a la sala de emergencias.  Nmeros de bper  - Dr. Kowalski: [phone]  - Dra. Moye: [phone]  - Dra. Stewart: [phone]  En caso de inclemencias del tiempo, por favor llame a nuestra lnea principal al [phone] para una actualizacin sobre el estado de cualquier retraso o cierre.  Consejos para la medicacin en dermatologa: Por favor, guarde las cajas en las que vienen los medicamentos de uso tpico para ayudarle a seguir las instrucciones sobre dnde y cmo usarlos. Las farmacias generalmente imprimen las instrucciones del medicamento slo en las cajas y no directamente en los tubos del medicamento.   Si su medicamento es muy caro, por favor, pngase en contacto con nuestra oficina llamando al [phone] y presione la opcin 4 o envenos un mensaje a travs de MyChart.   No podemos decirle cul ser su copago por los medicamentos por adelantado ya que esto es diferente dependiendo de la cobertura de su seguro.  Sin embargo, es posible que podamos encontrar un medicamento sustituto a menor costo o llenar un formulario para que el seguro cubra el medicamento que se considera necesario.   Si se requiere una autorizacin previa para que su compaa de seguros cubra su medicamento, por favor permtanos de 1 a 2 das hbiles para completar este proceso.  Los precios de los medicamentos varan con frecuencia dependiendo del lugar de dnde se surte la receta y alguna farmacias pueden ofrecer precios ms baratos.  El sitio web www.goodrx.com tiene cupones para medicamentos de diferentes farmacias. Los precios aqu no tienen en cuenta lo que podra costar con la ayuda del seguro (puede ser ms barato con su seguro), pero el sitio web puede darle el precio si no utiliz ningn seguro.  - Puede imprimir el cupn correspondiente y llevarlo con su receta a la farmacia.  - Tambin puede pasar por nuestra oficina durante el horario de atencin regular y recoger una tarjeta de cupones de GoodRx.  -   Si necesita que su receta se enve electrnicamente a una farmacia diferente, informe a nuestra oficina a travs de MyChart de [place] o por telfono llamando al [phone] y presione la opcin 4.  

## 2022-03-17 NOTE — Progress Notes (Signed)
Follow-Up Visit   Subjective  Shawn Lam is a 71 y.o. male who presents for the following: Total body skin exam (Hx of AKs) and scaly spots (R ear, forehead, ~91m scaly). The patient presents for Total-Body Skin Exam (TBSE) for skin cancer screening and mole check.  The patient has spots, moles and lesions to be evaluated, some may be new or changing and the patient has concerns that these could be cancer.   The following portions of the chart were reviewed this encounter and updated as appropriate:   Tobacco  Allergies  Meds  Problems  Med Hx  Surg Hx  Fam Hx     Review of Systems:  No other skin or systemic complaints except as noted in HPI or Assessment and Plan.  Objective  Well appearing patient in no apparent distress; mood and affect are within normal limits.  A full examination was performed including scalp, head, eyes, ears, nose, lips, neck, chest, axillae, abdomen, back, buttocks, bilateral upper extremities, bilateral lower extremities, hands, feet, fingers, toes, fingernails, and toenails. All findings within normal limits unless otherwise noted below.  Scalp x 2 (2) Stuck on waxy paps with erythema  L forehead x 1, R temple x 2, R ear x 2, L ear x 2 (7) Pink scaly macules   Assessment & Plan   Lentigines - Scattered tan macules - Due to sun exposure - Benign-appearing, observe - Recommend daily broad spectrum sunscreen SPF 30+ to sun-exposed areas, reapply every 2 hours as needed. - Call for any changes - back  Seborrheic Keratoses - Stuck-on, waxy, tan-brown papules and/or plaques  - Benign-appearing - Discussed benign etiology and prognosis. - Observe - Call for any changes - trunk, scalp  Melanocytic Nevi - Tan-brown and/or pink-flesh-colored symmetric macules and papules - Benign appearing on exam today - Observation - Call clinic for new or changing moles - Recommend daily use of broad spectrum spf 30+ sunscreen to sun-exposed areas.   - trunk  Hemangiomas - Red papules - Discussed benign nature - Observe - Call for any changes - trunk  Purpura - Chronic; persistent and recurrent.  Treatable, but not curable. - Violaceous macules and patches - Benign - Related to trauma, age, sun damage and/or use of blood thinners, chronic use of topical and/or oral steroids - Observe - Can use OTC arnica containing moisturizer such as Dermend Bruise Formula if desired - Call for worsening or other concerns   Actinic Damage - Chronic condition, secondary to cumulative UV/sun exposure - diffuse scaly erythematous macules with underlying dyspigmentation - Recommend daily broad spectrum sunscreen SPF 30+ to sun-exposed areas, reapply every 2 hours as needed.  - Staying in the shade or wearing long sleeves, sun glasses (UVA+UVB protection) and wide brim hats (4-inch brim around the entire circumference of the hat) are also recommended for sun protection.  - Call for new or changing lesions.  Skin cancer screening performed today.   Inflamed seborrheic keratosis (2) Scalp x 2 Symptomatic, irritating, patient would like treated. Destruction of lesion - Scalp x 2 Complexity: simple   Destruction method: cryotherapy   Informed consent: discussed and consent obtained   Timeout:  patient name, date of birth, surgical site, and procedure verified Lesion destroyed using liquid nitrogen: Yes   Region frozen until ice ball extended beyond lesion: Yes   Outcome: patient tolerated procedure well with no complications   Post-procedure details: wound care instructions given    AK (actinic keratosis) (7) L forehead x  1, R temple x 2, R ear x 2, L ear x 2 Destruction of lesion - L forehead x 1, R temple x 2, R ear x 2, L ear x 2 Complexity: simple   Destruction method: cryotherapy   Informed consent: discussed and consent obtained   Timeout:  patient name, date of birth, surgical site, and procedure verified Lesion destroyed using  liquid nitrogen: Yes   Region frozen until ice ball extended beyond lesion: Yes   Outcome: patient tolerated procedure well with no complications   Post-procedure details: wound care instructions given    Return in about 1 year (around 03/18/2023) for TBSE, Hx of AKs.  I, Othelia Pulling, RMA, am acting as scribe for Sarina Ser, MD . Documentation: I have reviewed the above documentation for accuracy and completeness, and I agree with the above.  Sarina Ser, MD

## 2022-03-18 DIAGNOSIS — G4733 Obstructive sleep apnea (adult) (pediatric): Secondary | ICD-10-CM | POA: Diagnosis not present

## 2022-03-18 DIAGNOSIS — R202 Paresthesia of skin: Secondary | ICD-10-CM | POA: Diagnosis not present

## 2022-03-18 DIAGNOSIS — M5481 Occipital neuralgia: Secondary | ICD-10-CM | POA: Diagnosis not present

## 2022-03-18 DIAGNOSIS — R251 Tremor, unspecified: Secondary | ICD-10-CM | POA: Diagnosis not present

## 2022-03-18 DIAGNOSIS — R413 Other amnesia: Secondary | ICD-10-CM | POA: Diagnosis not present

## 2022-03-18 DIAGNOSIS — R519 Headache, unspecified: Secondary | ICD-10-CM | POA: Diagnosis not present

## 2022-03-18 DIAGNOSIS — R200 Anesthesia of skin: Secondary | ICD-10-CM | POA: Diagnosis not present

## 2022-03-18 DIAGNOSIS — F419 Anxiety disorder, unspecified: Secondary | ICD-10-CM | POA: Diagnosis not present

## 2022-03-18 DIAGNOSIS — Z8679 Personal history of other diseases of the circulatory system: Secondary | ICD-10-CM | POA: Diagnosis not present

## 2022-03-22 ENCOUNTER — Other Ambulatory Visit (INDEPENDENT_AMBULATORY_CARE_PROVIDER_SITE_OTHER): Payer: Medicare Other

## 2022-03-22 DIAGNOSIS — E039 Hypothyroidism, unspecified: Secondary | ICD-10-CM | POA: Diagnosis not present

## 2022-03-22 DIAGNOSIS — I25118 Atherosclerotic heart disease of native coronary artery with other forms of angina pectoris: Secondary | ICD-10-CM | POA: Diagnosis not present

## 2022-03-22 LAB — COMPREHENSIVE METABOLIC PANEL
ALT: 36 U/L (ref 0–53)
AST: 46 U/L — ABNORMAL HIGH (ref 0–37)
Albumin: 4.3 g/dL (ref 3.5–5.2)
Alkaline Phosphatase: 62 U/L (ref 39–117)
BUN: 20 mg/dL (ref 6–23)
CO2: 30 mEq/L (ref 19–32)
Calcium: 9.7 mg/dL (ref 8.4–10.5)
Chloride: 105 mEq/L (ref 96–112)
Creatinine, Ser: 1.77 mg/dL — ABNORMAL HIGH (ref 0.40–1.50)
GFR: 38.1 mL/min — ABNORMAL LOW (ref >60.00)
Glucose, Bld: 82 mg/dL (ref 70–99)
Potassium: 4.7 mEq/L (ref 3.5–5.1)
Sodium: 141 mEq/L (ref 135–145)
Total Bilirubin: 0.7 mg/dL (ref 0.2–1.2)
Total Protein: 6.4 g/dL (ref 6.0–8.3)

## 2022-03-22 LAB — LIPID PANEL
Cholesterol: 153 mg/dL (ref 0–200)
HDL: 72.4 mg/dL (ref >39.00)
LDL Cholesterol: 66 mg/dL (ref 0–99)
NonHDL: 80.2
Total CHOL/HDL Ratio: 2
Triglycerides: 71 mg/dL (ref 0.0–149.0)
VLDL: 14.2 mg/dL (ref 0.0–40.0)

## 2022-03-22 LAB — TSH: TSH: 5.49 u[IU]/mL (ref 0.35–5.50)

## 2022-03-22 LAB — T3, FREE: T3, Free: 2.5 pg/mL (ref 2.3–4.2)

## 2022-03-22 LAB — T4, FREE: Free T4: 0.56 ng/dL — ABNORMAL LOW (ref 0.60–1.60)

## 2022-03-22 NOTE — Progress Notes (Signed)
No critical labs need to be addressed urgently. We will discuss labs in detail at upcoming office visit.   

## 2022-03-23 ENCOUNTER — Emergency Department: Payer: Medicare Other

## 2022-03-23 ENCOUNTER — Emergency Department
Admission: EM | Admit: 2022-03-23 | Discharge: 2022-03-24 | Disposition: A | Discharge status: Home / Self Care | Payer: Medicare Other | Attending: Emergency Medicine | Admitting: Emergency Medicine

## 2022-03-23 DIAGNOSIS — S0990XA Unspecified injury of head, initial encounter: Secondary | ICD-10-CM | POA: Diagnosis not present

## 2022-03-23 DIAGNOSIS — Z20822 Contact with and (suspected) exposure to covid-19: Secondary | ICD-10-CM | POA: Diagnosis not present

## 2022-03-23 DIAGNOSIS — W0110XA Fall on same level from slipping, tripping and stumbling with subsequent striking against unspecified object, initial encounter: Secondary | ICD-10-CM | POA: Insufficient documentation

## 2022-03-23 DIAGNOSIS — S199XXA Unspecified injury of neck, initial encounter: Secondary | ICD-10-CM | POA: Diagnosis not present

## 2022-03-23 DIAGNOSIS — Z7902 Long term (current) use of antithrombotics/antiplatelets: Secondary | ICD-10-CM | POA: Diagnosis not present

## 2022-03-23 DIAGNOSIS — Z1152 Encounter for screening for COVID-19: Secondary | ICD-10-CM | POA: Insufficient documentation

## 2022-03-23 DIAGNOSIS — N189 Chronic kidney disease, unspecified: Secondary | ICD-10-CM | POA: Insufficient documentation

## 2022-03-23 DIAGNOSIS — R079 Chest pain, unspecified: Secondary | ICD-10-CM | POA: Diagnosis not present

## 2022-03-23 DIAGNOSIS — J984 Other disorders of lung: Secondary | ICD-10-CM | POA: Diagnosis not present

## 2022-03-23 DIAGNOSIS — S0101XA Laceration without foreign body of scalp, initial encounter: Secondary | ICD-10-CM | POA: Diagnosis not present

## 2022-03-23 DIAGNOSIS — W19XXXA Unspecified fall, initial encounter: Secondary | ICD-10-CM

## 2022-03-23 DIAGNOSIS — M2578 Osteophyte, vertebrae: Secondary | ICD-10-CM | POA: Diagnosis not present

## 2022-03-23 LAB — CBC WITH DIFFERENTIAL/PLATELET
Abs Immature Granulocytes: 0.01 10*3/uL (ref 0.00–0.07)
Basophils Absolute: 0.1 10*3/uL (ref 0.0–0.1)
Basophils Relative: 1 %
Eosinophils Absolute: 0.2 10*3/uL (ref 0.0–0.5)
Eosinophils Relative: 3 %
HCT: 41.3 % (ref 39.0–52.0)
Hemoglobin: 12.9 g/dL — ABNORMAL LOW (ref 13.0–17.0)
Immature Granulocytes: 0 %
Lymphocytes Relative: 40 %
Lymphs Abs: 2.4 10*3/uL (ref 0.7–4.0)
MCH: 31.9 pg (ref 26.0–34.0)
MCHC: 31.2 g/dL (ref 30.0–36.0)
MCV: 102 fL — ABNORMAL HIGH (ref 80.0–100.0)
Monocytes Absolute: 0.5 10*3/uL (ref 0.1–1.0)
Monocytes Relative: 9 %
Neutro Abs: 2.8 10*3/uL (ref 1.7–7.7)
Neutrophils Relative %: 47 %
Platelets: 215 10*3/uL (ref 150–400)
RBC: 4.05 MIL/uL — ABNORMAL LOW (ref 4.22–5.81)
RDW: 13.4 % (ref 11.5–15.5)
WBC: 6 10*3/uL (ref 4.0–10.5)
nRBC: 0 % (ref 0.0–0.2)

## 2022-03-23 NOTE — ED Provider Notes (Signed)
[facility] Provider Note    Event Date/Time   First MD Initiated Contact with Patient 03/23/22 2256     (approximate)   History   Fall   HPI  Shawn Lam is a 71 y.o. male who presents to the ED for evaluation of Fall   I reviewed PCP visit from 11/28.  History of GAD, CKD.  Antiplatelet with clopidogrel.  Patient presents to the ED for evaluation of a fall and head injury.  He reports dealing with intermittent "random" dizziness chronically and was recently referred to a neurologist due to this.  Reports "seeing some new doctors in the past month or 2."  He reports being on his pants tonight's and tripping forward and striking his head.  No syncope.  Feels fine now.  No recent acute illnesses.  Physical Exam   Triage Vital Signs: ED Triage Vitals  Enc Vitals Group     BP 03/23/22 2236 (!) 142/90     Pulse Rate 03/23/22 2236 76     Resp 03/23/22 2236 18     Temp 03/23/22 2236 98.2 F (36.8 C)     Temp Source 03/23/22 2236 Oral     SpO2 03/23/22 2236 98 %     Weight 03/23/22 2237 150 lb (68 kg)     Height 03/23/22 2237 '6\' 1"'$  (1.854 m)     Head Circumference --      Peak Flow --      Pain Score 03/23/22 2237 5     Pain Loc --      Pain Edu? --      Excl. in [place]? --     Most recent vital signs: Vitals:   03/23/22 2236  BP: (!) 142/90  Pulse: 76  Resp: 18  Temp: 98.2 F (36.8 C)  SpO2: 98%    General: Awake, no distress.  CV:  Good peripheral perfusion.  Resp:  Normal effort.  Abd:  No distention.  MSK:  No deformity noted to the extremities or back.  No signs of trauma, tenderness Neuro:  No focal deficits appreciated. Cranial nerves II through XII intact 5/5 strength and sensation in all 4 extremities Other:  3 cm obliquely oriented laceration to the left superior forehead/hairline.  Hemostatic with direct pressure.  No hematoma or step-offs.   ED Results / Procedures / Treatments   Labs (all labs ordered are listed, but  only abnormal results are displayed) Labs Reviewed  CBC WITH DIFFERENTIAL/PLATELET - Abnormal; Notable for the following components:      Result Value   RBC 4.05 (*)    Hemoglobin 12.9 (*)    MCV 102.0 (*)    All other components within normal limits  COMPREHENSIVE METABOLIC PANEL - Abnormal; Notable for the following components:   Glucose, Bld 109 (*)    Creatinine, Ser 1.63 (*)    AST 52 (*)    GFR, Estimated 45 (*)    All other components within normal limits  RESP PANEL BY RT-PCR (RSV, FLU A&B, COVID)  RVPGX2  TROPONIN I (HIGH SENSITIVITY)  TROPONIN I (HIGH SENSITIVITY)    EKG   RADIOLOGY CT head interpreted by me without evidence of acute intracranial pathology CT cervical spine interpreted by me without evidence of fracture or dislocation CXR interpreted by me without evidence of acute cardioulmonary pathology.  Official radiology report(s): CT Head Wo Contrast  Result Date: 03/23/2022 CLINICAL DATA:  Trauma.  Fall. EXAM: CT HEAD WITHOUT CONTRAST CT CERVICAL SPINE  WITHOUT CONTRAST TECHNIQUE: Multidetector CT imaging of the head and cervical spine was performed following the standard protocol without intravenous contrast. Multiplanar CT image reconstructions of the cervical spine were also generated. RADIATION DOSE REDUCTION: This exam was performed according to the departmental dose-optimization program which includes automated exposure control, adjustment of the mA and/or kV according to patient size and/or use of iterative reconstruction technique. COMPARISON:  CT head 11/29/2018.  MRI brain 01/07/2022 FINDINGS: CT HEAD FINDINGS Brain: No evidence of acute infarction, hemorrhage, hydrocephalus, extra-axial collection or mass lesion/mass effect. Vascular: No hyperdense vessel or unexpected calcification. Skull: Normal. Negative for fracture or focal lesion. Sinuses/Orbits: No acute finding. Other: None. CT CERVICAL SPINE FINDINGS Alignment: Normal. Skull base and vertebrae: No  acute fracture. No primary bone lesion or focal pathologic process. Soft tissues and spinal canal: No prevertebral fluid or swelling. No visible canal hematoma. Disc levels: There is mild disc space narrowing and endplate osteophyte formation throughout the cervical spine most significant at C4-C5, C5-C6 and C6-C7. Disc bulges are seen at multiple levels. No significant central canal or neural foraminal stenosis at any level. Upper chest: There is scarring in both lung apices. Other: Stimulator device/wire is noted in the right neck. IMPRESSION: No acute intracranial process. No acute fracture or traumatic subluxation of the cervical spine. Electronically Signed   By: Ronney Asters M.D.   On: 03/23/2022 23:15   CT Cervical Spine Wo Contrast  Result Date: 03/23/2022 CLINICAL DATA:  Trauma.  Fall. EXAM: CT HEAD WITHOUT CONTRAST CT CERVICAL SPINE WITHOUT CONTRAST TECHNIQUE: Multidetector CT imaging of the head and cervical spine was performed following the standard protocol without intravenous contrast. Multiplanar CT image reconstructions of the cervical spine were also generated. RADIATION DOSE REDUCTION: This exam was performed according to the departmental dose-optimization program which includes automated exposure control, adjustment of the mA and/or kV according to patient size and/or use of iterative reconstruction technique. COMPARISON:  CT head 11/29/2018.  MRI brain 01/07/2022 FINDINGS: CT HEAD FINDINGS Brain: No evidence of acute infarction, hemorrhage, hydrocephalus, extra-axial collection or mass lesion/mass effect. Vascular: No hyperdense vessel or unexpected calcification. Skull: Normal. Negative for fracture or focal lesion. Sinuses/Orbits: No acute finding. Other: None. CT CERVICAL SPINE FINDINGS Alignment: Normal. Skull base and vertebrae: No acute fracture. No primary bone lesion or focal pathologic process. Soft tissues and spinal canal: No prevertebral fluid or swelling. No visible canal  hematoma. Disc levels: There is mild disc space narrowing and endplate osteophyte formation throughout the cervical spine most significant at C4-C5, C5-C6 and C6-C7. Disc bulges are seen at multiple levels. No significant central canal or neural foraminal stenosis at any level. Upper chest: There is scarring in both lung apices. Other: Stimulator device/wire is noted in the right neck. IMPRESSION: No acute intracranial process. No acute fracture or traumatic subluxation of the cervical spine. Electronically Signed   By: Ronney Asters M.D.   On: 03/23/2022 23:15   DG Chest Portable 1 View  Result Date: 03/23/2022 CLINICAL DATA:  Recent fall with chest pain, initial encounter EXAM: PORTABLE CHEST 1 VIEW COMPARISON:  02/07/2020 FINDINGS: Cardiac shadow is within normal limits. Tortuous thoracic aorta is seen. Loop recorder in stimulating device are noted. Lungs are clear bilaterally. No focal infiltrate or effusion is noted. IMPRESSION: No active disease. Electronically Signed   By: Inez Catalina M.D.   On: 03/23/2022 23:06    PROCEDURES and INTERVENTIONS:  .Marland KitchenLaceration Repair  Date/Time: 03/24/2022 12:44 AM  Performed by: Vladimir Crofts, MD Authorized by:  Vladimir Crofts, MD   Consent:    Consent obtained:  Verbal   Consent given by:  Patient   Risks, benefits, and alternatives were discussed: yes   Laceration details:    Location:  Scalp   Scalp location:  Frontal   Length (cm):  3 Exploration:    Imaging obtained comment:  CT   Imaging outcome: foreign body not noted     Contaminated: no   Treatment:    Area cleansed with:  Povidone-iodine   Amount of cleaning:  Standard Skin repair:    Repair method:  Staples   Number of staples:  2 Approximation:    Approximation:  Close Repair type:    Repair type:  Simple Post-procedure details:    Procedure completion:  Tolerated well, no immediate complications   Medications - No data to display   IMPRESSION / MDM / [place] /  ED COURSE  I reviewed the triage vital signs and the nursing notes.  Differential diagnosis includes, but is not limited to, ICH, skull fracture, stroke, syncope, seizure  {Patient presents with symptoms of an acute illness or injury that is potentially life-threatening.  Pleasant 71 year old male presents to the ED after falling and striking his head while putting on some pants, with the small scalp laceration requiring a couple staples, presently benign workup and suitable for outpatient management.  He is well to me.  Reassuring neurologic examination.  Small laceration to the scalp is only signs of trauma.  I repaired this with 2 staples after it is cleaned.  Blood work without leukocytosis or signs of sepsis.  Normal troponin.  CKD around baseline on his metabolic panel.  Reassuring imaging, as above.  We will discharge with close return precautions.      FINAL CLINICAL IMPRESSION(S) / ED DIAGNOSES   Final diagnoses:  Fall, initial encounter  Laceration of scalp, initial encounter  Injury of head, initial encounter     Rx / DC Orders   ED Discharge Orders     None        Note:  This document was prepared using Dragon voice recognition software and may include unintentional dictation errors.   Vladimir Crofts, MD 03/24/22 [phone]

## 2022-03-23 NOTE — ED Triage Notes (Signed)
Fall tonight, attempting to change pants. Pt states he " got feet tangled up" and fell.  Pt hit side of left head on bedside table. Takes Plavix. Denies LOC. Pt states he felt dizzy prior to falling, and wife reports increased episodes of dizziness over past several weeks. Has followed up with neurology for dizziness and is scheduled for injection in January.  Wife has pt has had " quite a bit of weight loss" in the last months. Reports apx 20-25 lb unintentional weight loss.  Also having issues with constipation. Pt wife states that pt took [place] '10mg'$  prior to presenting to ED and " he's starting to feel it and go out."

## 2022-03-24 DIAGNOSIS — S0101XA Laceration without foreign body of scalp, initial encounter: Secondary | ICD-10-CM | POA: Diagnosis not present

## 2022-03-24 LAB — COMPREHENSIVE METABOLIC PANEL
ALT: 44 U/L (ref 0–44)
AST: 52 U/L — ABNORMAL HIGH (ref 15–41)
Albumin: 4.1 g/dL (ref 3.5–5.0)
Alkaline Phosphatase: 56 U/L (ref 38–126)
Anion gap: 5 (ref 5–15)
BUN: 22 mg/dL (ref 8–23)
CO2: 29 mmol/L (ref 22–32)
Calcium: 9.5 mg/dL (ref 8.9–10.3)
Chloride: 108 mmol/L (ref 98–111)
Creatinine, Ser: 1.63 mg/dL — ABNORMAL HIGH (ref 0.61–1.24)
GFR, Estimated: 45 mL/min — ABNORMAL LOW (ref >60)
Glucose, Bld: 109 mg/dL — ABNORMAL HIGH (ref 70–99)
Potassium: 4.3 mmol/L (ref 3.5–5.1)
Sodium: 142 mmol/L (ref 135–145)
Total Bilirubin: 0.8 mg/dL (ref 0.3–1.2)
Total Protein: 6.8 g/dL (ref 6.5–8.1)

## 2022-03-24 LAB — RESP PANEL BY RT-PCR (RSV, FLU A&B, COVID)  RVPGX2
Influenza A by PCR: NEGATIVE
Influenza B by PCR: NEGATIVE
Resp Syncytial Virus by PCR: NEGATIVE
SARS Coronavirus 2 by RT PCR: NEGATIVE

## 2022-03-24 LAB — TROPONIN I (HIGH SENSITIVITY): Troponin I (High Sensitivity): 5 ng/L (ref <18)

## 2022-03-24 MED ORDER — OXYCODONE HCL 5 MG PO TABS
5.0000 mg | ORAL_TABLET | Freq: Once | ORAL | Status: AC
  Administered 2022-03-24: 5 mg via ORAL
  Filled 2022-03-24: qty 1

## 2022-03-24 NOTE — Discharge Instructions (Signed)
Gently wash the wound with soap and water.  It is okay to shower, but do not submerge in a bath or go swimming as it is healing.  Do not vigorously scrub.   Gently pat dry.   Once dry, then apply Neosporin or bacitracin or even Vaseline ointment to the area to act as a barrier to help prevent infection.  Those 2 staples need to be removed after 7-10 days.  This can be done at any clinic, urgent care or ED.  Use Tylenol for pain and fevers.  Up to 1000 mg per dose, up to 4 times per day.  Do not take more than 4000 mg of Tylenol/acetaminophen within 24 hours.[name]

## 2022-03-27 ENCOUNTER — Encounter: Payer: Self-pay | Admitting: Dermatology

## 2022-03-28 ENCOUNTER — Other Ambulatory Visit: Payer: Self-pay | Admitting: Family Medicine

## 2022-03-28 ENCOUNTER — Telehealth: Payer: Self-pay

## 2022-03-28 NOTE — Telephone Encounter (Signed)
Last office visit 03/08/22 for follow up headache/mood.  Last refilled 03/01/22 for #30 with no refills.  CPE scheduled 03/29/22.

## 2022-03-28 NOTE — Progress Notes (Addendum)
Chronic Care Management Pharmacy Assistant   Name: Shawn Lam  MRN: [id number] DOB: 06/09/1950  Reason for Encounter: CCM [facility] Follow-Up)  Medications: Outpatient Encounter Medications as of 03/28/2022  Medication Sig   atorvastatin (LIPITOR) 80 MG tablet Take 1 tablet (80 mg total) by mouth daily.   buPROPion (WELLBUTRIN XL) 150 MG 24 hr tablet Take 2 tablets (300 mg total) by mouth daily.   clopidogrel (PLAVIX) 75 MG tablet Take 1 tablet (75 mg total) by mouth daily.   denosumab (PROLIA) 60 MG/ML SOSY injection Inject 60 mg into the skin every 6 (six) months. Do not send RX, order this from the office   DULoxetine (CYMBALTA) 30 MG capsule 3 capsules  po daily   Elastic Bandages & Supports (ABDOMINAL BINDER/ELASTIC LARGE) MISC 1 Units by Does not apply route daily.   ferrous sulfate 325 (65 FE) MG tablet TAKE 1 TABLET BY MOUTH 3 TIMES DAILY WITH MEALS.   finasteride (PROSCAR) 5 MG tablet Take 5 mg by mouth daily.   furosemide (LASIX) 20 MG tablet TAKE 1 TABLET BY MOUTH DAILY AS NEEDED FOR SWELLING AND SHORTNESS OF BREATH. TAKE SPARINGLY.   gabapentin (NEURONTIN) 100 MG capsule TAKE 2 CAPSULES BY MOUTH TWICE A DAY   levothyroxine (SYNTHROID) 75 MCG tablet TAKE 1 TABLET BY MOUTH EVERY DAY BEFORE BREAKFAST   midodrine (PROAMATINE) 10 MG tablet Take 1 tablet (10 mg total) by mouth 3 (three) times daily.   Multiple Vitamins-Minerals (MULTIVITAMIN WITH MINERALS) tablet Take 1 tablet by mouth daily.   nitroGLYCERIN (NITROSTAT) 0.4 MG SL tablet Place 1 tablet (0.4 mg total) under the tongue every 5 (five) minutes as needed for chest pain.   sucralfate (CARAFATE) 1 g tablet TAKE 1 TABLET BY MOUTH 4 TIMES A DAY WITH MEALS AND AT BEDTIME   triamcinolone cream (KENALOG) 0.1 % APPLY 1 APPLICATION TOPICALLY 2 (TWO) TIMES DAILY. AVOID FACE, GROIN, UNDERARMS. (Patient taking differently: Apply 1 application  topically 2 (two) times daily as needed (irritation). Avoid face, groin, underarms.)    zolpidem (AMBIEN) 10 MG tablet TAKE ONE TABLET BY MOUTH EVERY NIGHT AT BEDTIME AS NEEDED   No facility-administered encounter medications on file as of 03/28/2022.   Reviewed hospital notes for details of recent visit. Has patient been contacted by Transitions of Care team? No Has patient seen PCP/specialist for hospital follow up (summarize OV if yes): No  Admitted to the ED on 03/23/2022. Discharge date was 03/23/2022.  Discharged from [facility].   Discharge diagnosis (Principal Problem): Fall Patient was discharged to Home  Brief summary of hospital course: Differential diagnosis includes, but is not limited to, ICH, skull fracture, stroke, syncope, seizure   {Patient presents with symptoms of an acute illness or injury that is potentially life-threatening.   Pleasant 71 year old male presents to the ED after falling and striking his head while putting on some pants, with the small scalp laceration requiring a couple staples, presently benign workup and suitable for outpatient management.  He is well to me.  Reassuring neurologic examination.  Small laceration to the scalp is only signs of trauma.  I repaired this with 2 staples after it is cleaned.  Blood work without leukocytosis or signs of sepsis.  Normal troponin.  CKD around baseline on his metabolic panel.  Reassuring imaging, as above.  We will discharge with close return precautions.  Medications that remain the same after Hospital Discharge:??  -All other medications will remain the same.    Next CCM  appt: 09/14/2022  Other upcoming appts: PCP appointment on 03/29/2022 for physical  Charlene Brooke, PharmD notified and will determine if action is needed.  Marijean Niemann, [place] Pharmacy Assistant [phone]   Pharmacist addendum: Reviewed chart. Pt has seen PCP for physical and staple removal. No PharmD intervention warranted.  Charlene Brooke, PharmD, BCACP 04/08/22 11:40 AM

## 2022-03-29 ENCOUNTER — Encounter: Payer: Self-pay | Admitting: Family Medicine

## 2022-03-29 ENCOUNTER — Ambulatory Visit (INDEPENDENT_AMBULATORY_CARE_PROVIDER_SITE_OTHER): Payer: Medicare Other

## 2022-03-29 ENCOUNTER — Ambulatory Visit (INDEPENDENT_AMBULATORY_CARE_PROVIDER_SITE_OTHER): Payer: Medicare Other | Admitting: Family Medicine

## 2022-03-29 VITALS — BP 104/70 | HR 75 | Temp 98.0°F | Ht 69.0 in | Wt 147.0 lb

## 2022-03-29 VITALS — Ht 73.0 in | Wt 150.0 lb

## 2022-03-29 DIAGNOSIS — E782 Mixed hyperlipidemia: Secondary | ICD-10-CM

## 2022-03-29 DIAGNOSIS — Z0000 Encounter for general adult medical examination without abnormal findings: Secondary | ICD-10-CM

## 2022-03-29 DIAGNOSIS — N02B9 Other recurrent and persistent immunoglobulin A nephropathy: Secondary | ICD-10-CM | POA: Diagnosis not present

## 2022-03-29 DIAGNOSIS — G609 Hereditary and idiopathic neuropathy, unspecified: Secondary | ICD-10-CM

## 2022-03-29 DIAGNOSIS — F411 Generalized anxiety disorder: Secondary | ICD-10-CM

## 2022-03-29 DIAGNOSIS — M810 Age-related osteoporosis without current pathological fracture: Secondary | ICD-10-CM | POA: Diagnosis not present

## 2022-03-29 DIAGNOSIS — G4486 Cervicogenic headache: Secondary | ICD-10-CM

## 2022-03-29 DIAGNOSIS — E039 Hypothyroidism, unspecified: Secondary | ICD-10-CM | POA: Diagnosis not present

## 2022-03-29 DIAGNOSIS — I2510 Atherosclerotic heart disease of native coronary artery without angina pectoris: Secondary | ICD-10-CM

## 2022-03-29 DIAGNOSIS — G629 Polyneuropathy, unspecified: Secondary | ICD-10-CM

## 2022-03-29 MED ORDER — LEVOTHYROXINE SODIUM 88 MCG PO TABS: 88.0000 ug | ORAL_TABLET | Freq: Every day | ORAL | 11 refills | Status: DC

## 2022-03-29 NOTE — Progress Notes (Signed)
Patient ID: Shawn Lam, male    DOB: [birth year], 71 y.o.   MRN: [id number]  This visit was conducted in person.  BP 104/70   Pulse 75   Temp 98 F (36.7 C) (Oral)   Ht _0  (1.753 m)   Wt 147 lb (66.7 kg)   SpO2 98%   BMI 21.71 kg/m    CC:  Chief Complaint  Patient presents with   Annual Exam    Part 2    Subjective:   HPI: Shawn Lam is a 71 y.o. male presenting on 03/29/2022 for   The patient presents for complete physical and review of chronic health problems. He/She also has the following acute concerns today:  Seen in ED on December 13 following a fall: Resulting in laceration of scalp Felt fall was a result of "random dizziness that he has chronically",lost balance.  CT head and cervical spine: No acute intracranial process. No acute fracture or traumatic subluxation of the cervical spine.   New appt  12/9 with Neuro for chronic headache and dizziness reviewed.  Dr. Manuella Ghazi. Order occipital nerve bloc.. planned 04/12/2021  order Vit B12, Vit D, ESR, CRP - Continue tylenol as needed  Continued constipation.. has been treating with dulcolax.   OSA: Followed by pulmonary     Chronic insomnia: Stable control on Ambien..10 mg nightly.     IG A nephropathy/CKD: Followed by Dr.  Holley Raring q 6 month OV.   Orthostatic hypotension... stable on midodrine 10 mg TID  CAD: followed by Dr. Rockey Situ cardiology.    Hypothyroid  Stable control on levothyroxine 75 mg daily Lab Results  Component Value Date   TSH 5.49 03/22/2022    Elevated Cholesterol: Good control at goal LDL < 70 on atorvastatin 40 mg daily Lab Results  Component Value Date   CHOL 153 03/22/2022   HDL 72.40 03/22/2022   LDLCALC 66 03/22/2022   LDLDIRECT 57 06/22/2009   TRIG 71.0 03/22/2022   CHOLHDL 2 03/22/2022  Using medications without problems: Muscle aches: none Diet compliance: healthy eating Exercise: daily  walking/jogging  1 hour Other complaints:   MDD, GAD chronic insomnia :  moderate control on max cymbalta and  wellbutrin XL 150 mg  He is not interested in referral to counseling.  Peripheral neuropathy  good control on gabapentin  100 mg 2 capsules BID     BPH followed by Dr. Tresa Moore. Stable on tamsulosin and finasteride   Patient Care Team: Jinny Sanders, MD as PCP - General Rockey Situ Kathlene November, MD as PCP - Cardiology (Cardiology) Charlton Haws, [facility] as Pharmacist (Pharmacist)  Relevant past medical, surgical, family and social history reviewed and updated as indicated. Interim medical history since our last visit reviewed. Allergies and medications reviewed and updated. Outpatient Medications Prior to Visit  Medication Sig Dispense Refill   atorvastatin (LIPITOR) 80 MG tablet Take 1 tablet (80 mg total) by mouth daily. 90 tablet 3   buPROPion (WELLBUTRIN XL) 150 MG 24 hr tablet Take 2 tablets (300 mg total) by mouth daily. 60 tablet 3   clopidogrel (PLAVIX) 75 MG tablet Take 1 tablet (75 mg total) by mouth daily. 90 tablet 3   denosumab (PROLIA) 60 MG/ML SOSY injection Inject 60 mg into the skin every 6 (six) months. Do not send RX, order this from the office     DULoxetine (CYMBALTA) 30 MG capsule 3 capsules  po daily 90 capsule 3   Elastic Bandages & Supports (ABDOMINAL BINDER/ELASTIC  LARGE) MISC 1 Units by Does not apply route daily. 1 each 1   ferrous sulfate 325 (65 FE) MG tablet TAKE 1 TABLET BY MOUTH 3 TIMES DAILY WITH MEALS. 270 tablet 3   finasteride (PROSCAR) 5 MG tablet Take 5 mg by mouth daily.     furosemide (LASIX) 20 MG tablet TAKE 1 TABLET BY MOUTH DAILY AS NEEDED FOR SWELLING AND SHORTNESS OF BREATH. TAKE SPARINGLY. 30 tablet 2   gabapentin (NEURONTIN) 100 MG capsule TAKE 2 CAPSULES BY MOUTH TWICE A DAY 120 capsule 1   levothyroxine (SYNTHROID) 75 MCG tablet TAKE 1 TABLET BY MOUTH EVERY DAY BEFORE BREAKFAST 90 tablet 3   midodrine (PROAMATINE) 10 MG tablet Take 1 tablet (10 mg total) by mouth 3 (three) times daily. 270 tablet 3    Multiple Vitamins-Minerals (MULTIVITAMIN WITH MINERALS) tablet Take 1 tablet by mouth daily.     nitroGLYCERIN (NITROSTAT) 0.4 MG SL tablet Place 1 tablet (0.4 mg total) under the tongue every 5 (five) minutes as needed for chest pain. 10 tablet 1   sucralfate (CARAFATE) 1 g tablet TAKE 1 TABLET BY MOUTH 4 TIMES A DAY WITH MEALS AND AT BEDTIME 120 tablet 2   triamcinolone cream (KENALOG) 0.1 % APPLY 1 APPLICATION TOPICALLY 2 (TWO) TIMES DAILY. AVOID FACE, GROIN, UNDERARMS. (Patient taking differently: Apply 1 application  topically 2 (two) times daily as needed (irritation). Avoid face, groin, underarms.) 454 g 0   zolpidem (AMBIEN) 10 MG tablet TAKE ONE TABLET BY MOUTH EVERY NIGHT AT BEDTIME AS NEEDED 30 tablet 0   No facility-administered medications prior to visit.     Per HPI unless specifically indicated in ROS section below Review of Systems Objective:  BP 104/70   Pulse 75   Temp 98 F (36.7 C) (Oral)   Ht _0  (1.753 m)   Wt 147 lb (66.7 kg)   SpO2 98%   BMI 21.71 kg/m   Wt Readings from Last 3 Encounters:  03/29/22 147 lb (66.7 kg)  03/29/22 150 lb (68 kg)  03/23/22 150 lb (68 kg)      Physical Exam    Results for orders placed or performed during the hospital encounter of 03/23/22  Resp panel by RT-PCR (RSV, Flu A&B, Covid) Anterior Nasal Swab   Specimen: Anterior Nasal Swab  Result Value Ref Range   SARS Coronavirus 2 by RT PCR NEGATIVE NEGATIVE   Influenza A by PCR NEGATIVE NEGATIVE   Influenza B by PCR NEGATIVE NEGATIVE   Resp Syncytial Virus by PCR NEGATIVE NEGATIVE  CBC with Differential  Result Value Ref Range   WBC 6.0 4.0 - 10.5 K/uL   RBC 4.05 (L) 4.22 - 5.81 MIL/uL   Hemoglobin 12.9 (L) 13.0 - 17.0 g/dL   HCT 41.3 39.0 - 52.0 %   MCV 102.0 (H) 80.0 - 100.0 fL   MCH 31.9 26.0 - 34.0 pg   MCHC 31.2 30.0 - 36.0 g/dL   RDW 13.4 11.5 - 15.5 %   Platelets 215 150 - 400 K/uL   nRBC 0.0 0.0 - 0.2 %   Neutrophils Relative % 47 %   Neutro Abs 2.8 1.7 - 7.7  K/uL   Lymphocytes Relative 40 %   Lymphs Abs 2.4 0.7 - 4.0 K/uL   Monocytes Relative 9 %   Monocytes Absolute 0.5 0.1 - 1.0 K/uL   Eosinophils Relative 3 %   Eosinophils Absolute 0.2 0.0 - 0.5 K/uL   Basophils Relative 1 %   Basophils Absolute  0.1 0.0 - 0.1 K/uL   Immature Granulocytes 0 %   Abs Immature Granulocytes 0.01 0.00 - 0.07 K/uL  Comprehensive metabolic panel  Result Value Ref Range   Sodium 142 135 - 145 mmol/L   Potassium 4.3 3.5 - 5.1 mmol/L   Chloride 108 98 - 111 mmol/L   CO2 29 22 - 32 mmol/L   Glucose, Bld 109 (H) 70 - 99 mg/dL   BUN 22 8 - 23 mg/dL   Creatinine, Ser 1.63 (H) 0.61 - 1.24 mg/dL   Calcium 9.5 8.9 - 10.3 mg/dL   Total Protein 6.8 6.5 - 8.1 g/dL   Albumin 4.1 3.5 - 5.0 g/dL   AST 52 (H) 15 - 41 U/L   ALT 44 0 - 44 U/L   Alkaline Phosphatase 56 38 - 126 U/L   Total Bilirubin 0.8 0.3 - 1.2 mg/dL   GFR, Estimated 45 (L) >60 mL/min   Anion gap 5 5 - 15  Troponin I (High Sensitivity)  Result Value Ref Range   Troponin I (High Sensitivity) 5 <18 ng/L     COVID 19 screen:  No recent travel or known exposure to COVID19 The patient denies respiratory symptoms of COVID 19 at this time. The importance of social distancing was discussed today.   Assessment and Plan The patient's preventative maintenance and recommended screening tests for an annual wellness exam were reviewed in full today. Brought up to date unless services declined.  Counselled on the importance of diet, exercise, and its role in overall health and mortality. The patient's FH and SH was reviewed, including their home life, tobacco status, and drug and alcohol status.   Vaccines: Uptodate flu  and tdap  S/P COVID x 3, completed shingrix and COVID booster. Nonsmoker    Prostate cancer screening no longer indicated. Colon cancer screening: Colonoscopy 10/19/2021 nml, repeat in 5 years. Hep C: done w HIV: neg test  Osteoporosis:  declining over last 2 years on fosamax..  changed to  Prolia,  started 2022.[name] LAst DEXA 2021,, duerepeat DEXA in 2 years.    Problem List Items Addressed This Visit     Generalized anxiety disorder (Chronic)    Chronic, improved control on current regimen of Cymbalta 90 mg daily and Wellbutrin XL 300 mg daily      HLD (hyperlipidemia) (Chronic)    Chronic, excellent control on atorvastatin 40 mg p.o. daily.  Statin is likely the cause of slight increase in ALT.  Will follow over time      Hypothyroidism (Chronic)    Free T4 low and TSH borderline.  Given patient with constipation and fatigue I will increase levothyroxine to 88 mcg daily.  He will return in 4 to 6 weeks for repeat thyroid function testing.      Relevant Medications   levothyroxine (SYNTHROID) 88 MCG tablet   IgA nephropathy (Chronic)    Chronic, followed by renal.  Slight worsening in renal function on recent labs.  Encouraged water intake.  He continues to avoid NSAIDs.      Peripheral neuropathy (Chronic)    Chronic, good control on gabapentin 100 mg 2 capsules twice daily      Cervicogenic headache    He has upcoming occipital nerve block planned by Dr. Brigitte Pulse.      Osteoporosis without current pathological fracture - Primary    Due for repeat bone density. On Prolia      Relevant Orders   DG Bone Density     Kalana Yust,  MD

## 2022-03-29 NOTE — Progress Notes (Signed)
Subjective:   Shawn Lam is a 71 y.o. male who presents for Medicare Annual/Subsequent preventive examination.  Review of Systems    Virtual Visit via Telephone Note  I connected with  Shawn Lam on 03/29/22 at  8:45 AM EST by telephone and verified that I am speaking with the correct person using two identifiers.  Location: Patient: Home Provider: Office Persons participating in the virtual visit: patient/Nurse Health Advisor   I discussed the limitations, risks, security and privacy concerns of performing an evaluation and management service by telephone and the availability of in person appointments. The patient expressed understanding and agreed to proceed.  Interactive audio and video telecommunications were attempted between this nurse and patient, however failed, due to patient having technical difficulties OR patient did not have access to video capability.  We continued and completed visit with audio only.  Some vital signs may be absent or patient reported.   Criselda Peaches, LPN  Cardiac Risk Factors include: advanced age (>17mn, >>59women);Other (see comment), Risk factor comments: Dx CAD and Othoststic Hypotention     Objective:    Today's Vitals   03/29/22 0846 03/29/22 0847  Weight: 150 lb (68 kg)   Height: '6\' 1"'$  (1.854 m)   PainSc:  0-No pain   Body mass index is 19.79 kg/m.     03/29/2022    8:56 AM 10/19/2021    8:46 AM 08/25/2021    3:00 PM 08/16/2021    2:15 PM 02/26/2021   11:45 AM 02/25/2020   11:15 AM 02/07/2020    2:39 PM  Advanced Directives  Does Patient Have a Medical Advance Directive? No No No No No No No  Would patient like information on creating a medical advance directive? No - Patient declined No - Patient declined No - Patient declined  Yes (MAU/Ambulatory/Procedural Areas - Information given) No - Patient declined     Current Medications (verified) Outpatient Encounter Medications as of 03/29/2022  Medication Sig    atorvastatin (LIPITOR) 80 MG tablet Take 1 tablet (80 mg total) by mouth daily.   buPROPion (WELLBUTRIN XL) 150 MG 24 hr tablet Take 2 tablets (300 mg total) by mouth daily.   clopidogrel (PLAVIX) 75 MG tablet Take 1 tablet (75 mg total) by mouth daily.   denosumab (PROLIA) 60 MG/ML SOSY injection Inject 60 mg into the skin every 6 (six) months. Do not send RX, order this from the office   DULoxetine (CYMBALTA) 30 MG capsule 3 capsules  po daily   Elastic Bandages & Supports (ABDOMINAL BINDER/ELASTIC LARGE) MISC 1 Units by Does not apply route daily.   ferrous sulfate 325 (65 FE) MG tablet TAKE 1 TABLET BY MOUTH 3 TIMES DAILY WITH MEALS.   finasteride (PROSCAR) 5 MG tablet Take 5 mg by mouth daily.   furosemide (LASIX) 20 MG tablet TAKE 1 TABLET BY MOUTH DAILY AS NEEDED FOR SWELLING AND SHORTNESS OF BREATH. TAKE SPARINGLY.   gabapentin (NEURONTIN) 100 MG capsule TAKE 2 CAPSULES BY MOUTH TWICE A DAY   levothyroxine (SYNTHROID) 75 MCG tablet TAKE 1 TABLET BY MOUTH EVERY DAY BEFORE BREAKFAST   midodrine (PROAMATINE) 10 MG tablet Take 1 tablet (10 mg total) by mouth 3 (three) times daily.   Multiple Vitamins-Minerals (MULTIVITAMIN WITH MINERALS) tablet Take 1 tablet by mouth daily.   nitroGLYCERIN (NITROSTAT) 0.4 MG SL tablet Place 1 tablet (0.4 mg total) under the tongue every 5 (five) minutes as needed for chest pain.   sucralfate (CARAFATE) 1  g tablet TAKE 1 TABLET BY MOUTH 4 TIMES A DAY WITH MEALS AND AT BEDTIME   triamcinolone cream (KENALOG) 0.1 % APPLY 1 APPLICATION TOPICALLY 2 (TWO) TIMES DAILY. AVOID FACE, GROIN, UNDERARMS. (Patient taking differently: Apply 1 application  topically 2 (two) times daily as needed (irritation). Avoid face, groin, underarms.)   zolpidem (AMBIEN) 10 MG tablet TAKE ONE TABLET BY MOUTH EVERY NIGHT AT BEDTIME AS NEEDED   No facility-administered encounter medications on file as of 03/29/2022.    Allergies (verified) Codeine and Tape   History: Past Medical  History:  Diagnosis Date   Actinic keratosis 02/04/2021   right lateral neck   Anemia    Anxiety    Asymptomatic LV dysfunction    50-55% by echo 06/2016   Berger's disease    CAD (coronary artery disease) 06/2005   PCI of LAD and RCA   Depression    Dilated aortic root (HCC)    4cm at sinus of Valsalva   Hematuria    Hyperlipidemia    Hypothyroidism    Insomnia    Left ureteral stone    Orthostatic hypotension    negative tilt table   OSA (obstructive sleep apnea)    moderate with AHI 17/hr, uses CPAP nightly(01/15/19 - has Inspire implant)   PVC's (premature ventricular contractions) 07/07/2016   Renal disorder    PT IS SEEING DR. Lorrene Reid- NEPHROLOGIST   Rosacea    Urticaria    Past Surgical History:  Procedure Laterality Date   CARDIAC CATHETERIZATION  09/06/2013   [facility]   CARDIAC CATHETERIZATION     COLONOSCOPY WITH PROPOFOL N/A 01/21/2019   Procedure: COLONOSCOPY WITH PROPOFOL;  Surgeon: Lucilla Lame, MD;  Location: [place];  Service: Endoscopy;  Laterality: N/A;  sleep apnea   COLONOSCOPY WITH PROPOFOL N/A 10/19/2021   Procedure: COLONOSCOPY WITH PROPOFOL;  Surgeon: Lucilla Lame, MD;  Location[facility] END[facility]COPY;  Service: Endoscopy;  Laterality: N/A;   CORONARY ANGIOPLASTY  2004   STENT PLACEMENT   CYSTOSCOPY WITH RETROGRADE PYELOGRAM, URETEROSCOPY AND STENT PLACEMENT Left 03/19/2014   Procedure: CYSTOSCOPY WITH RETROGRADE PYELOGRAM, URETEROSCOPY AND STENT PLACEMENT;  Surgeon: Junious Dresser, MD;  Location: [facility];  Service: Urology;  Laterality: Left;   CYSTOSCOPY WITH RETROGRADE PYELOGRAM, URETEROSCOPY AND STENT PLACEMENT Bilateral 05/20/2015   Procedure:  CYSTOSCOPY WITH BILATERAL RETROGRADE PYELOGRAM,RIGHT  DIAGNOSTIC URETEROSCOPY ,LEFT URETEROSCOPY WITH HOLMIUM LASER  AND BILATERAL  STENT PLACEMENT ;  Surgeon: Alexis Frock, MD;  Location: [facility];  Service: Urology;  Laterality: Bilateral;   DRUG INDUCED ENDOSCOPY N/A 10/20/2017   Procedure: DRUG INDUCED  SLEEP ENDOSCOPY;  Surgeon: Melida Quitter, MD;  Location: [place];  Service: ENT;  Laterality: N/A;   EP IMPLANTABLE DEVICE N/A 05/13/2016   Procedure: Loop Recorder Insertion;  Surgeon: Deboraha Sprang, MD;  Location: [place] [facility];  Service: Cardiovascular;  Laterality: N/A;   ESOPHAGOGASTRODUODENOSCOPY N/A 10/19/2021   Procedure: ESOPHAGOGASTRODUODENOSCOPY (EGD);  Surgeon: Lucilla Lame, MD;  Location: [facility] ENDOSCOPY;  Service: Endoscopy;  Laterality: N/A;   ESOPHAGOGASTRODUODENOSCOPY (EGD) WITH PROPOFOL N/A 11/30/2018   Procedure: ESOPHAGOGASTRODUODENOSCOPY (EGD) WITH PROPOFOL;  Surgeon: Jonathon Bellows, MD;  Location: [facility] ENDOSCOPY;  Service: Gastroenterology;  Laterality: N/A;   ESOPHAGOGASTRODUODENOSCOPY (EGD) WITH PROPOFOL N/A 12/01/2018   Procedure: ESOPHAGOGASTRODUODENOSCOPY (EGD) WITH PROPOFOL;  Surgeon: Lucilla Lame, MD;  Location: [facility] ENDOSCOPY;  Service: Endoscopy;  Laterality: N/A;   HOLMIUM LASER APPLICATION Bilateral 10/11/2200   Procedure: HOLMIUM LASER APPLICATION;  Surgeon: Alexis Frock, MD;  Location: [facility];  Service: Urology;  Laterality: Bilateral;   IMPLIMENTATION OF A HYPOGLOSSAL NERVE STIMULATOR  12/08/2017   OSA    LEFT HEART CATHETERIZATION WITH CORONARY ANGIOGRAM N/A 09/06/2013   Procedure: LEFT HEART CATHETERIZATION WITH CORONARY ANGIOGRAM;  Surgeon: Sinclair Grooms, MD;  Location: [facility] CATH LAB;  Service: Cardiovascular;  Laterality: N/A;   LEFT KNEE CAP  SURGERY ABOUT 40 YRS AGO  ? 1970     STONE EXTRACTION WITH BASKET Left 03/19/2014   Procedure: STONE EXTRACTION WITH BASKET;  Surgeon: Junious Dresser, MD;  Location: [facility];  Service: Urology;  Laterality: Left;   TRANSURETHRAL RESECTION OF PROSTATE N/A 08/25/2021   Procedure: TRANSURETHRAL RESECTION OF THE PROSTATE (TURP);  Surgeon: Alexis Frock, MD;  Location: [facility];  Service: Urology;  Laterality: N/A;  1 HR   Family History  Problem Relation Age of Onset   Coronary artery disease Mother     Heart attack Mother    Heart disease Mother    Hypertension Mother    Cancer Father        lung and colon   Lung cancer Father        smoker   Diabetes Brother    Social History   Socioeconomic History   Marital status: Married    Spouse name: Not on file   Number of children: Not on file   Years of education: Not on file   Highest education level: Not on file  Occupational History   Occupation: retired Print production planner  Tobacco Use   Smoking status: Never   Smokeless tobacco: Never  Vaping Use   Vaping Use: Never used  Substance and Sexual Activity   Alcohol use: Never    Alcohol/week: 1.0 standard drink of alcohol    Types: 1 Cans of beer per week   Drug use: No   Sexual activity: Yes    Partners: Female    Birth control/protection: None  Other Topics Concern   Not on file  Social History Narrative   Regular exercise--yes--jog 5 miles 6 days a week   Social Determinants of Health   Financial Resource Strain: Low Risk  (03/29/2022)   Overall Financial Resource Strain (CARDIA)    Difficulty of Paying Living Expenses: Not hard at all  Food Insecurity: No Food Insecurity (03/29/2022)   Hunger Vital Sign    Worried About Running Out of Food in the Last Year: Never true    [place] in the Last Year: Never true  Transportation Needs: No Transportation Needs (03/29/2022)   PRAPARE - Hydrologist (Medical): No    Lack of Transportation (Non-Medical): No  Physical Activity: Sufficiently Active (03/29/2022)   Exercise Vital Sign    Days of Exercise per Week: 7 days    Minutes of Exercise per Session: 60 min  Stress: No Stress Concern Present (03/29/2022)   [place]    Feeling of Stress : Not at all  Social Connections: Moderately Isolated (03/29/2022)   Social Connection and Isolation Panel [NHANES]    Frequency of Communication with Friends and Family: More than  three times a week    Frequency of Social Gatherings with Friends and Family: More than three times a week    Attends Religious Services: Never    Marine scientist or Organizations: No    Attends Archivist Meetings: Never    Marital Status: Married  Tobacco Counseling Counseling given: Not Answered   Clinical Intake:  Pre-visit preparation completed: No  Pain : No/denies pain Pain Score: 0-No pain     BMI - recorded: 19.79 Nutritional Status: BMI of 19-24  Normal Nutritional Risks: None Diabetes: No  How often do you need to have someone help you when you read instructions, pamphlets, or other written materials from your doctor or pharmacy?: 1 - Never  Diabetic?  No  Interpreter Needed?: No  Information entered by :: Rolene Arbour LPN   Activities of Daily Living    03/29/2022    8:53 AM 08/25/2021    3:00 PM  In your present state of health, do you have any difficulty performing the following activities:  Hearing? 0 0  Vision? 0 0  Difficulty concentrating or making decisions? 0 0  Walking or climbing stairs? 0 0  Dressing or bathing? 0 0  Doing errands, shopping? 0 0  Preparing Food and eating ? N   Using the Toilet? N   In the past six months, have you accidently leaked urine? N   Do you have problems with loss of bowel control? N   Managing your Medications? N   Managing your Finances? N   Housekeeping or managing your Housekeeping? N     Patient Care Team: Jinny Sanders, MD as PCP - General Rockey Situ Kathlene November, MD as PCP - Cardiology (Cardiology) Charlton Haws, [facility] as Pharmacist (Pharmacist)  Indicate any recent Medical Services you may have received from other than [facility] providers in the past year (date may be approximate).     Assessment:   This is a routine wellness examination for Shawn Lam.  Hearing/Vision screen Hearing Screening - Comments:: Denies hearing difficulties   Vision Screening - Comments:: Wears rx  glasses - up to date with routine eye exams with  Dr Matilde Sprang  Dietary issues and exercise activities discussed: Current Exercise Habits: Home exercise routine, Type of exercise: walking, Time (Minutes): 60, Frequency (Times/Week): 7, Weekly Exercise (Minutes/Week): 420, Intensity: Moderate, Exercise limited by: None identified   Goals Addressed               This Visit's Progress     No current goals (pt-stated)        No current goals (pt-stated)         Depression Screen    03/29/2022    8:52 AM 03/08/2022    4:38 PM 07/30/2021   11:37 AM 07/30/2021   11:31 AM 02/26/2021   11:46 AM 02/25/2020   11:16 AM 08/22/2019   11:36 AM  PHQ 2/9 Scores  PHQ - 2 Score 0 5 0 0 0 0 0  PHQ- 9 Score 0 12 0 0  0     Fall Risk    03/29/2022    8:54 AM 07/30/2021   11:30 AM 02/26/2021   11:46 AM 02/25/2020   11:15 AM 02/21/2019   11:40 AM  Fall Risk   Falls in the past year? 1 1 0 0 1  Comment     passed out due to low hemoglobin  Number falls in past yr: 0 1 0 0 0  Injury with Fall? 1 0 0 0 1  Comment Head injury. Followed by medical attention.    Patient fractured his arm  Risk for fall due to : Impaired balance/gait No Fall Risks No Fall Risks Medication side effect Other (Comment)  Risk for fall due to: Comment     low hemoglobin  Follow up Falls prevention discussed Falls evaluation completed Falls prevention discussed Falls evaluation completed;Falls prevention discussed Falls evaluation completed;Falls prevention discussed    FALL RISK PREVENTION PERTAINING TO THE HOME:  Any stairs in or around the home? No  If so, are there any without handrails? No  Home free of loose throw rugs in walkways, pet beds, electrical cords, etc? Yes  Adequate lighting in your home to reduce risk of falls? Yes   ASSISTIVE DEVICES UTILIZED TO PREVENT FALLS:  Life alert? No  Use of a cane, walker or w/c? No  Grab bars in the bathroom? No  Shower chair or bench in shower? No  Elevated toilet  seat or a handicapped toilet? No   TIMED UP AND GO:  Was the test performed? No . Audio Visit    Cognitive Function:    02/25/2020   11:20 AM 02/21/2019   11:42 AM 02/13/2018   12:20 PM  MMSE - Mini Mental State Exam  Orientation to time '5 5 5  '$ Orientation to Place '5 5 5  '$ Registration '3 3 3  '$ Attention/ Calculation 5 5 0  Recall '3 3 2  '$ Recall-comments   unable to recall 1 of 3 words  Language- name 2 objects   0  Language- repeat '1 1 1  '$ Language- follow 3 step command   3  Language- read & follow direction   0  Write a sentence   0  Copy design   0  Total score   19        03/29/2022    8:57 AM  6CIT Screen  What Year? 4 points  What month? 0 points  What time? 0 points  Count back from 20 0 points  Months in reverse 2 points  Repeat phrase 0 points  Total Score 6 points    Immunizations Immunization History  Administered Date(s) Administered   Fluad Quad(high Dose 65+) 12/04/2018, 01/01/2020   Influenza Split 01/04/2011, 01/27/2012   Influenza Whole 01/09/2008, 03/04/2009, 12/27/2009   Influenza, High Dose Seasonal PF 01/04/2021   Influenza,inj,Quad PF,6+ Mos 01/31/2013, 01/31/2014, 02/03/2015, 02/09/2016, 01/20/2017, 01/23/2018   Influenza-Unspecified 12/08/2021   PFIZER Comirnaty(Gray Top)Covid-19 Tri-Sucrose Vaccine 05/04/2020   PFIZER(Purple Top)SARS-COV-2 Vaccination 06/01/2019, 06/25/2019   Pneumococcal Conjugate-13 02/09/2016   Pneumococcal Polysaccharide-23 02/23/2017   Td 06/05/2007   Tdap 03/13/2019   Zoster Recombinat (Shingrix) 10/13/2021, 12/16/2021   Zoster, Live 01/25/2011    TDAP status: Up to date  Flu Vaccine status: Up to date  Pneumococcal vaccine status: Up to date  Covid-19 vaccine status: Completed vaccines  Qualifies for Shingles Vaccine? Yes   Zostavax completed Yes   Shingrix Completed?: Yes  Screening Tests Health Maintenance  Topic Date Due   COVID-19 Vaccine (4 - 2023-24 season) 04/14/2022 (Originally 12/10/2021)    DEXA SCAN  04/02/2022   Medicare Annual Wellness (AWV)  03/30/2023   COLONOSCOPY (Pts 45-11yr Insurance coverage will need to be confirmed)  10/20/2026   DTaP/Tdap/Td (3 - Td or Tdap) 03/12/2029   Pneumonia Vaccine 71 Years old  Completed   INFLUENZA VACCINE  Completed   Hepatitis C Screening  Completed   Zoster Vaccines- Shingrix  Completed   HPV VACCINES  Aged Out    Health Maintenance  There are no preventive care reminders to display for this patient.   Colorectal cancer screening: Type of screening: Colonoscopy. Completed 10/19/21. Repeat every 5 years  Lung Cancer Screening: (Low Dose CT Chest recommended if Age 71-80years, 30 pack-year currently smoking OR  have quit w/in 15years.) does not qualify.     Additional Screening:  Hepatitis C Screening: does qualify; Completed 02/03/15  Vision Screening: Recommended annual ophthalmology exams for early detection of glaucoma and other disorders of the eye. Is the patient up to date with their annual eye exam?  Yes  Who is the provider or what is the name of the office in which the patient attends annual eye exams? Dr Matilde Sprang If pt is not established with a provider, would they like to be referred to a provider to establish care? No .   Dental Screening: Recommended annual dental exams for proper oral hygiene  Community Resource Referral / Chronic Care Management:  CRR required this visit?  No   CCM required this visit?  No      Plan:     I have personally reviewed and noted the following in the patient's chart:   Medical and social history Use of alcohol, tobacco or illicit drugs  Current medications and supplements including opioid prescriptions. Patient is not currently taking opioid prescriptions. Functional ability and status Nutritional status Physical activity Advanced directives List of other physicians Hospitalizations, surgeries, and ER visits in previous 12 months Vitals Screenings to include  cognitive, depression, and falls Referrals and appointments  In addition, I have reviewed and discussed with patient certain preventive protocols, quality metrics, and best practice recommendations. A written personalized care plan for preventive services as well as general preventive health recommendations were provided to patient.     Criselda Peaches, LPN   [id number]   Nurse Notes: None

## 2022-03-29 NOTE — Assessment & Plan Note (Signed)
Chronic, excellent control on atorvastatin 40 mg p.o. daily.  Statin is likely the cause of slight increase in ALT.  Will follow over time

## 2022-03-29 NOTE — Assessment & Plan Note (Signed)
Chronic, good control on gabapentin 100 mg 2 capsules twice daily

## 2022-03-29 NOTE — Assessment & Plan Note (Signed)
Chronic, followed by renal.  Slight worsening in renal function on recent labs.  Encouraged water intake.  He continues to avoid NSAIDs.

## 2022-03-29 NOTE — Assessment & Plan Note (Signed)
Chronic, improved control on current regimen of Cymbalta 90 mg daily and Wellbutrin XL 300 mg daily

## 2022-03-29 NOTE — Patient Instructions (Addendum)
Please call the location of your choice from the menu below to schedule your  Bone Density appointment.     [place] at [facility]   Phone:  [phone]   [place] [place], [place] [address]                                            Services: 3D Mammogram and Echo  [facility] at [facility])  Phone:  [phone]   [address] [place], [place] [address]                                              Services:  3D Mammogram and Bone Density   Keep up with the water and fiber intake. Start MiraLAX 17 g daily. If no improvement you can increase to twice daily. If still no improvement at that point you can try milk of magnesia and or an enema.    Increase levothyroxine tpo 88 mcg daily.  Return in 4-6 weeks for repeat TSH testing.

## 2022-03-29 NOTE — Assessment & Plan Note (Signed)
He has upcoming occipital nerve block planned by Dr. Brigitte Pulse.

## 2022-03-29 NOTE — Patient Instructions (Addendum)
[name] , Thank you for taking time to come for your Medicare Wellness Visit. I appreciate your ongoing commitment to your health goals. Please review the following plan we discussed and let me know if I can assist you in the future.   These are the goals we discussed:  Goals       DIET - EAT MORE FRUITS AND VEGETABLES      Starting 02/13/2018, I will attempt to eat at least 2 servings of fresh fruits and vegetables daily.       Manage My Medicine      Timeframe:  Long-Range Goal Priority:  Medium Start Date:     03/18/21                        Expected End Date: 03/18/22                      Follow Up Date June 2023   - call for medicine refill 2 or 3 days before it runs out - call if I am sick and can't take my medicine - keep a list of all the medicines I take; vitamins and herbals too - use a pillbox to sort medicine    Why is this important?   These steps will help you keep on track with your medicines.   Notes:       No current goals (pt-stated)      No current goals (pt-stated)      Patient Stated      02/21/2019, I will maintain and continue medications as prescribed.      Patient Stated      02/25/2020, I will continue to walk 6 days a week for 1 hour.       Patient Stated      Would like to continue to walk.        This is a list of the screening recommended for you and due dates:  Health Maintenance  Topic Date Due   COVID-19 Vaccine (4 - 2023-24 season) 04/14/2022*   DEXA scan (bone density measurement)  04/02/2022   Medicare Annual Wellness Visit  03/30/2023   Colon Cancer Screening  10/20/2026   DTaP/Tdap/Td vaccine (3 - Td or Tdap) 03/12/2029   Pneumonia Vaccine  Completed   Flu Shot  Completed   Hepatitis C Screening: USPSTF Recommendation to screen - Ages 18-79 yo.  Completed   Zoster (Shingles) Vaccine  Completed   HPV Vaccine  Aged Out  *Topic was postponed. The date shown is not the original due date.    Advanced directives: Advance  directive discussed with you today. Even though you declined this today, please call our office should you change your mind, and we can give you the proper paperwork for you to fill out.   Conditions/risks identified: None  Next appointment: Follow up in one year for your annual wellness visit.    Preventive Care 47 Years and Older, Male  Preventive care refers to lifestyle choices and visits with your health care provider that can promote health and wellness. What does preventive care include? A yearly physical exam. This is also called an annual well check. Dental exams once or twice a year. Routine eye exams. Ask your health care provider how often you should have your eyes checked. Personal lifestyle choices, including: Daily care of your teeth and gums. Regular physical activity. Eating a healthy diet. Avoiding tobacco and drug use. Limiting alcohol  use. Practicing safe sex. Taking low doses of aspirin every day. Taking vitamin and mineral supplements as recommended by your health care provider. What happens during an annual well check? The services and screenings done by your health care provider during your annual well check will depend on your age, overall health, lifestyle risk factors, and family history of disease. Counseling  Your health care provider may ask you questions about your: Alcohol use. Tobacco use. Drug use. Emotional well-being. Home and relationship well-being. Sexual activity. Eating habits. History of falls. Memory and ability to understand (cognition). Work and work Statistician. Screening  You may have the following tests or measurements: Height, weight, and BMI. Blood pressure. Lipid and cholesterol levels. These may be checked every 5 years, or more frequently if you are over 25 years old. Skin check. Lung cancer screening. You may have this screening every year starting at age 40 if you have a 30-pack-year history of smoking and currently  smoke or have quit within the past 15 years. Fecal occult blood test (FOBT) of the stool. You may have this test every year starting at age 77. Flexible sigmoidoscopy or colonoscopy. You may have a sigmoidoscopy every 5 years or a colonoscopy every 10 years starting at age 4. Prostate cancer screening. Recommendations will vary depending on your family history and other risks. Hepatitis C blood test. Hepatitis B blood test. Sexually transmitted disease (STD) testing. Diabetes screening. This is done by checking your blood sugar (glucose) after you have not eaten for a while (fasting). You may have this done every 1-3 years. Abdominal aortic aneurysm (AAA) screening. You may need this if you are a current or former smoker. Osteoporosis. You may be screened starting at age 109 if you are at high risk. Talk with your health care provider about your test results, treatment options, and if necessary, the need for more tests. Vaccines  Your health care provider may recommend certain vaccines, such as: Influenza vaccine. This is recommended every year. Tetanus, diphtheria, and acellular pertussis (Tdap, Td) vaccine. You may need a Td booster every 10 years. Zoster vaccine. You may need this after age 26. Pneumococcal 13-valent conjugate (PCV13) vaccine. One dose is recommended after age 19. Pneumococcal polysaccharide (PPSV23) vaccine. One dose is recommended after age 10. Talk to your health care provider about which screenings and vaccines you need and how often you need them. This information is not intended to replace advice given to you by your health care provider. Make sure you discuss any questions you have with your health care provider. Document Released: 04/24/2015 Document Revised: 12/16/2015 Document Reviewed: 01/27/2015 Elsevier Interactive Patient Education  2017 [employer] Prevention in the Home Falls can cause injuries. They can happen to people of all ages. There are many  things you can do to make your home safe and to help prevent falls. What can I do on the outside of my home? Regularly fix the edges of walkways and driveways and fix any cracks. Remove anything that might make you trip as you walk through a door, such as a raised step or threshold. Trim any bushes or trees on the path to your home. Use bright outdoor lighting. Clear any walking paths of anything that might make someone trip, such as rocks or tools. Regularly check to see if handrails are loose or broken. Make sure that both sides of any steps have handrails. Any raised decks and porches should have guardrails on the edges. Have any leaves, snow, or  ice cleared regularly. Use sand or salt on walking paths during winter. Clean up any spills in your garage right away. This includes oil or grease spills. What can I do in the bathroom? Use night lights. Install grab bars by the toilet and in the tub and shower. Do not use towel bars as grab bars. Use non-skid mats or decals in the tub or shower. If you need to sit down in the shower, use a plastic, non-slip stool. Keep the floor dry. Clean up any water that spills on the floor as soon as it happens. Remove soap buildup in the tub or shower regularly. Attach bath mats securely with double-sided non-slip rug tape. Do not have throw rugs and other things on the floor that can make you trip. What can I do in the bedroom? Use night lights. Make sure that you have a light by your bed that is easy to reach. Do not use any sheets or blankets that are too big for your bed. They should not hang down onto the floor. Have a firm chair that has side arms. You can use this for support while you get dressed. Do not have throw rugs and other things on the floor that can make you trip. What can I do in the kitchen? Clean up any spills right away. Avoid walking on wet floors. Keep items that you use a lot in easy-to-reach places. If you need to reach  something above you, use a strong step stool that has a grab bar. Keep electrical cords out of the way. Do not use floor polish or wax that makes floors slippery. If you must use wax, use non-skid floor wax. Do not have throw rugs and other things on the floor that can make you trip. What can I do with my stairs? Do not leave any items on the stairs. Make sure that there are handrails on both sides of the stairs and use them. Fix handrails that are broken or loose. Make sure that handrails are as long as the stairways. Check any carpeting to make sure that it is firmly attached to the stairs. Fix any carpet that is loose or worn. Avoid having throw rugs at the top or bottom of the stairs. If you do have throw rugs, attach them to the floor with carpet tape. Make sure that you have a light switch at the top of the stairs and the bottom of the stairs. If you do not have them, ask someone to add them for you. What else can I do to help prevent falls? Wear shoes that: Do not have high heels. Have rubber bottoms. Are comfortable and fit you well. Are closed at the toe. Do not wear sandals. If you use a stepladder: Make sure that it is fully opened. Do not climb a closed stepladder. Make sure that both sides of the stepladder are locked into place. Ask someone to hold it for you, if possible. Clearly mark and make sure that you can see: Any grab bars or handrails. First and last steps. Where the edge of each step is. Use tools that help you move around (mobility aids) if they are needed. These include: Canes. Walkers. Scooters. Crutches. Turn on the lights when you go into a dark area. Replace any light bulbs as soon as they burn out. Set up your furniture so you have a clear path. Avoid moving your furniture around. If any of your floors are uneven, fix them. If there are any pets  around you, be aware of where they are. Review your medicines with your doctor. Some medicines can make you  feel dizzy. This can increase your chance of falling. Ask your doctor what other things that you can do to help prevent falls. This information is not intended to replace advice given to you by your health care provider. Make sure you discuss any questions you have with your health care provider. Document Released: 01/22/2009 Document Revised: 09/03/2015 Document Reviewed: 05/02/2014 Elsevier Interactive Patient Education  2017 [employer].

## 2022-03-29 NOTE — Assessment & Plan Note (Signed)
Due for repeat bone density. On Prolia

## 2022-03-29 NOTE — Assessment & Plan Note (Signed)
Free T4 low and TSH borderline.  Given patient with constipation and fatigue I will increase levothyroxine to 88 mcg daily.  He will return in 4 to 6 weeks for repeat thyroid function testing.

## 2022-04-01 ENCOUNTER — Encounter: Payer: Self-pay | Admitting: Family Medicine

## 2022-04-01 ENCOUNTER — Ambulatory Visit (INDEPENDENT_AMBULATORY_CARE_PROVIDER_SITE_OTHER): Payer: Medicare Other | Admitting: Family Medicine

## 2022-04-01 VITALS — BP 104/60 | HR 75 | Temp 97.8°F | Ht 69.0 in | Wt 148.4 lb

## 2022-04-01 DIAGNOSIS — S0181XD Laceration without foreign body of other part of head, subsequent encounter: Secondary | ICD-10-CM | POA: Diagnosis not present

## 2022-04-01 DIAGNOSIS — Z4802 Encounter for removal of sutures: Secondary | ICD-10-CM

## 2022-04-01 NOTE — Progress Notes (Signed)
71 year old male presents for removal of staples on the left forehead laceration.  Staples placed on 03/23/2022  Procedure:  Patient provided consent for procedure. 2 staples removed without incident using staple remover.  Minimal swelling remaining at site.  Wound edges well-approximated.  No bleeding.  Patient reports minimal pain.

## 2022-04-13 ENCOUNTER — Ambulatory Visit
Admission: RE | Admit: 2022-04-13 | Discharge: 2022-04-13 | Disposition: A | Discharge status: Home / Self Care | Payer: Medicare Other | Source: Ambulatory Visit | Attending: Family Medicine | Admitting: Family Medicine

## 2022-04-13 ENCOUNTER — Other Ambulatory Visit: Payer: Self-pay | Admitting: Family Medicine

## 2022-04-13 DIAGNOSIS — M810 Age-related osteoporosis without current pathological fracture: Secondary | ICD-10-CM | POA: Diagnosis not present

## 2022-04-13 DIAGNOSIS — M85851 Other specified disorders of bone density and structure, right thigh: Secondary | ICD-10-CM | POA: Diagnosis not present

## 2022-04-14 ENCOUNTER — Encounter: Payer: Self-pay | Admitting: Family Medicine

## 2022-04-15 DIAGNOSIS — M5481 Occipital neuralgia: Secondary | ICD-10-CM | POA: Diagnosis not present

## 2022-04-15 DIAGNOSIS — M7918 Myalgia, other site: Secondary | ICD-10-CM | POA: Diagnosis not present

## 2022-04-19 ENCOUNTER — Other Ambulatory Visit: Payer: Self-pay | Admitting: Cardiovascular Disease

## 2022-04-21 ENCOUNTER — Telehealth: Payer: Self-pay | Admitting: Family Medicine

## 2022-04-21 NOTE — Telephone Encounter (Signed)
Patient came in office and dropped off Shawn Lam ticket form to be filled out and has been placed in PCP folder.

## 2022-04-21 NOTE — Telephone Encounter (Signed)
Handicap Placard application placed in Dr. Rometta Emery office in box for signature.

## 2022-04-21 NOTE — Telephone Encounter (Signed)
Completed and in outbox. 

## 2022-04-22 ENCOUNTER — Telehealth: Payer: Self-pay | Admitting: Family Medicine

## 2022-04-22 DIAGNOSIS — D509 Iron deficiency anemia, unspecified: Secondary | ICD-10-CM

## 2022-04-22 DIAGNOSIS — E039 Hypothyroidism, unspecified: Secondary | ICD-10-CM

## 2022-04-22 DIAGNOSIS — E782 Mixed hyperlipidemia: Secondary | ICD-10-CM

## 2022-04-22 NOTE — Telephone Encounter (Signed)
No labs needed patient just performed labs in December.

## 2022-04-22 NOTE — Telephone Encounter (Signed)
-----  Message from Velna Hatchet, RT sent at 04/19/2022  3:44 PM EST ----- Regarding: Tue 1/23 lab Lab orders needed for labs only appt on 05/03/22, please.  Thanks, Anda Kraft

## 2022-04-22 NOTE — Telephone Encounter (Signed)
Patient returned call,I let him know that his application is ready for pick up

## 2022-04-22 NOTE — Telephone Encounter (Signed)
Left message for Mr. Schneller that his Handicap Placard application is ready to be picked up at the front desk.

## 2022-04-24 ENCOUNTER — Other Ambulatory Visit: Payer: Self-pay | Admitting: Family Medicine

## 2022-04-25 ENCOUNTER — Emergency Department
Admission: EM | Admit: 2022-04-25 | Discharge: 2022-04-25 | Disposition: A | Discharge status: Home / Self Care | Payer: Medicare Other | Attending: Emergency Medicine | Admitting: Emergency Medicine

## 2022-04-25 ENCOUNTER — Emergency Department: Payer: Medicare Other

## 2022-04-25 ENCOUNTER — Telehealth: Payer: Self-pay

## 2022-04-25 ENCOUNTER — Other Ambulatory Visit: Payer: Self-pay

## 2022-04-25 DIAGNOSIS — W19XXXA Unspecified fall, initial encounter: Secondary | ICD-10-CM

## 2022-04-25 DIAGNOSIS — J479 Bronchiectasis, uncomplicated: Secondary | ICD-10-CM | POA: Diagnosis not present

## 2022-04-25 DIAGNOSIS — M4854XA Collapsed vertebra, not elsewhere classified, thoracic region, initial encounter for fracture: Secondary | ICD-10-CM | POA: Diagnosis not present

## 2022-04-25 DIAGNOSIS — M79651 Pain in right thigh: Secondary | ICD-10-CM | POA: Insufficient documentation

## 2022-04-25 DIAGNOSIS — I6782 Cerebral ischemia: Secondary | ICD-10-CM | POA: Diagnosis not present

## 2022-04-25 DIAGNOSIS — S2231XA Fracture of one rib, right side, initial encounter for closed fracture: Secondary | ICD-10-CM | POA: Diagnosis not present

## 2022-04-25 DIAGNOSIS — R55 Syncope and collapse: Secondary | ICD-10-CM | POA: Diagnosis not present

## 2022-04-25 DIAGNOSIS — J439 Emphysema, unspecified: Secondary | ICD-10-CM | POA: Diagnosis not present

## 2022-04-25 DIAGNOSIS — M542 Cervicalgia: Secondary | ICD-10-CM | POA: Diagnosis not present

## 2022-04-25 DIAGNOSIS — S2232XA Fracture of one rib, left side, initial encounter for closed fracture: Secondary | ICD-10-CM | POA: Insufficient documentation

## 2022-04-25 DIAGNOSIS — Y92002 Bathroom of unspecified non-institutional (private) residence single-family (private) house as the place of occurrence of the external cause: Secondary | ICD-10-CM | POA: Diagnosis not present

## 2022-04-25 DIAGNOSIS — M954 Acquired deformity of chest and rib: Secondary | ICD-10-CM | POA: Diagnosis not present

## 2022-04-25 DIAGNOSIS — I700 Atherosclerosis of aorta: Secondary | ICD-10-CM | POA: Diagnosis not present

## 2022-04-25 DIAGNOSIS — M47812 Spondylosis without myelopathy or radiculopathy, cervical region: Secondary | ICD-10-CM | POA: Diagnosis not present

## 2022-04-25 DIAGNOSIS — R109 Unspecified abdominal pain: Secondary | ICD-10-CM | POA: Diagnosis not present

## 2022-04-25 DIAGNOSIS — S20309A Unspecified superficial injuries of unspecified front wall of thorax, initial encounter: Secondary | ICD-10-CM | POA: Diagnosis present

## 2022-04-25 DIAGNOSIS — S2241XA Multiple fractures of ribs, right side, initial encounter for closed fracture: Secondary | ICD-10-CM | POA: Diagnosis not present

## 2022-04-25 DIAGNOSIS — W182XXA Fall in (into) shower or empty bathtub, initial encounter: Secondary | ICD-10-CM | POA: Insufficient documentation

## 2022-04-25 DIAGNOSIS — M4856XA Collapsed vertebra, not elsewhere classified, lumbar region, initial encounter for fracture: Secondary | ICD-10-CM | POA: Diagnosis not present

## 2022-04-25 DIAGNOSIS — J8410 Pulmonary fibrosis, unspecified: Secondary | ICD-10-CM | POA: Diagnosis not present

## 2022-04-25 DIAGNOSIS — S3991XA Unspecified injury of abdomen, initial encounter: Secondary | ICD-10-CM | POA: Diagnosis not present

## 2022-04-25 LAB — CBC WITH DIFFERENTIAL/PLATELET
Abs Immature Granulocytes: 0.04 10*3/uL (ref 0.00–0.07)
Basophils Absolute: 0.1 10*3/uL (ref 0.0–0.1)
Basophils Relative: 1 %
Eosinophils Absolute: 0 10*3/uL (ref 0.0–0.5)
Eosinophils Relative: 0 %
HCT: 45 % (ref 39.0–52.0)
Hemoglobin: 14 g/dL (ref 13.0–17.0)
Immature Granulocytes: 1 %
Lymphocytes Relative: 14 %
Lymphs Abs: 1.2 10*3/uL (ref 0.7–4.0)
MCH: 32.1 pg (ref 26.0–34.0)
MCHC: 31.1 g/dL (ref 30.0–36.0)
MCV: 103.2 fL — ABNORMAL HIGH (ref 80.0–100.0)
Monocytes Absolute: 0.5 10*3/uL (ref 0.1–1.0)
Monocytes Relative: 6 %
Neutro Abs: 6.9 10*3/uL (ref 1.7–7.7)
Neutrophils Relative %: 78 %
Platelets: 230 10*3/uL (ref 150–400)
RBC: 4.36 MIL/uL (ref 4.22–5.81)
RDW: 13.2 % (ref 11.5–15.5)
WBC: 8.8 10*3/uL (ref 4.0–10.5)
nRBC: 0 % (ref 0.0–0.2)

## 2022-04-25 LAB — BASIC METABOLIC PANEL
Anion gap: 10 (ref 5–15)
BUN: 29 mg/dL — ABNORMAL HIGH (ref 8–23)
CO2: 23 mmol/L (ref 22–32)
Calcium: 9.3 mg/dL (ref 8.9–10.3)
Chloride: 105 mmol/L (ref 98–111)
Creatinine, Ser: 1.74 mg/dL — ABNORMAL HIGH (ref 0.61–1.24)
GFR, Estimated: 41 mL/min — ABNORMAL LOW (ref >60)
Glucose, Bld: 104 mg/dL — ABNORMAL HIGH (ref 70–99)
Potassium: 4.9 mmol/L (ref 3.5–5.1)
Sodium: 138 mmol/L (ref 135–145)

## 2022-04-25 MED ORDER — OXYCODONE-ACETAMINOPHEN 5-325 MG PO TABS
1.0000 | ORAL_TABLET | Freq: Once | ORAL | Status: AC
  Administered 2022-04-25: 1 via ORAL
  Filled 2022-04-25: qty 1

## 2022-04-25 MED ORDER — OXYCODONE HCL 5 MG PO TABS: 5.0000 mg | ORAL_TABLET | Freq: Four times a day (QID) | ORAL | 0 refills | Status: DC | PRN

## 2022-04-25 MED ORDER — IOHEXOL 300 MG/ML  SOLN
80.0000 mL | Freq: Once | INTRAMUSCULAR | Status: AC | PRN
  Administered 2022-04-25: 80 mL via INTRAVENOUS

## 2022-04-25 MED ORDER — FENTANYL CITRATE PF 50 MCG/ML IJ SOSY: 50.0000 ug | PREFILLED_SYRINGE | INTRAMUSCULAR | Status: DC | PRN

## 2022-04-25 MED ORDER — LIDOCAINE 5 % EX PTCH: 1.0000 | MEDICATED_PATCH | Freq: Two times a day (BID) | CUTANEOUS | 0 refills | Status: DC

## 2022-04-25 MED ORDER — FENTANYL CITRATE PF 50 MCG/ML IJ SOSY
50.0000 ug | PREFILLED_SYRINGE | Freq: Once | INTRAMUSCULAR | Status: AC
  Administered 2022-04-25: 50 ug via INTRAVENOUS
  Filled 2022-04-25: qty 1

## 2022-04-25 MED ORDER — LIDOCAINE 5 % EX PTCH
1.0000 | MEDICATED_PATCH | CUTANEOUS | Status: DC
  Administered 2022-04-25: 1 via TRANSDERMAL
  Filled 2022-04-25: qty 1

## 2022-04-25 NOTE — Telephone Encounter (Signed)
I left a vmail for patient that Dr Diona Browner does not need additional labs at this time because he just had labs in Dec.  His labs only appt on 05/03/22 was cancelled.

## 2022-04-25 NOTE — Discharge Instructions (Signed)
Please seek medical attention for any high fevers, chest pain, shortness of breath, change in behavior, persistent vomiting, bloody stool or any other new or concerning symptoms.  

## 2022-04-25 NOTE — ED Provider Notes (Signed)
[facility] Provider Note    Event Date/Time   First MD Initiated Contact with Patient 04/25/22 1233     (approximate)   History   Fall   HPI  Shawn Lam is a 72 y.o. male who presents to the emergency department today after a fall.  The patient states he was in the shower when he slipped.  Fell onto his right side.  He did hit his head and neck against a shower seat.  The patient thinks he might of had a brief loss of consciousness.  He is complaining primarily of neck pain and right chest pain.  Additionally having some right outer thigh pain although this pain only developed after some time.  Was able to bear weight on his legs after the incident.     Physical Exam   Triage Vital Signs: ED Triage Vitals  Enc Vitals Group     BP 04/25/22 1158 127/82     Pulse Rate 04/25/22 1158 95     Resp 04/25/22 1158 16     Temp 04/25/22 1158 98.2 F (36.8 C)     Temp Source 04/25/22 1158 Oral     SpO2 04/25/22 1158 98 %     Weight 04/25/22 1158 148 lb 5.9 oz (67.3 kg)     Height 04/25/22 1158 '5\' 9"'$  (1.753 m)     Head Circumference --      Peak Flow --      Pain Score 04/25/22 1149 10     Pain Loc --      Pain Edu? --      Excl. in [place]? --     Most recent vital signs: Vitals:   04/25/22 1158  BP: 127/82  Pulse: 95  Resp: 16  Temp: 98.2 F (36.8 C)  SpO2: 98%    General: Awake, alert, oriented. CV:  Good peripheral perfusion. Regular rate and rhythm. Resp:  Normal effort. Lungs clear. Abd:  No distention.  Other:  No midline spinal tenderness. Some tenderness to palpation of the right chest wall.    ED Results / Procedures / Treatments   Labs (all labs ordered are listed, but only abnormal results are displayed) Labs Reviewed  CBC WITH DIFFERENTIAL/PLATELET - Abnormal; Notable for the following components:      Result Value   MCV 103.2 (*)    All other components within normal limits  BASIC METABOLIC PANEL      EKG  None   RADIOLOGY I independently interpreted and visualized the right rib series. My interpretation: possible ninth rib fracture Radiology interpretation:  IMPRESSION:  There is mild cortical discontinuity in the lateral aspect of the  right ninth rib, fracture is not excluded.    I independently interpreted and visualized the ct head/cervical spine. My interpretation: no acute intracranial bleed Radiology interpretation:  CT head:    1. No evidence of acute intracranial abnormality.  2. Mild chronic small vessel ischemic changes within the cerebral  white matter.  3. Left sphenoid sinusitis.    CT cervical spine:    1. No evidence of acute fracture to the cervical spine.  2. Nonspecific straightening of the expected cervical lordosis.  3. Cervical spondylosis, as described.      PROCEDURES:  Critical Care performed: No  Procedures   MEDICATIONS ORDERED IN ED: Medications - No data to display   IMPRESSION / MDM / [place] / ED COURSE  I reviewed the triage vital signs and the nursing  notes.                              Differential diagnosis includes, but is not limited to, fracture, internal bleeding.  Patient's presentation is most consistent with acute presentation with potential threat to life or bodily function.  Patient presented to the emergency department today after a fall in the shower. The patient is complaining of neck and right chest wall pain. CT head/cervical spine without concerning traumatic findings. Rib series with likely 9th right rib fracture. On exam patient was tender in the right upper abdomen as well. Given some concern for possible solid organ injury will obtain CT scan of the abd/pel. Additionally will get ct scan of the chest to rule out further rib injury. Patient does state he slipped so it does sound mechanical, however will check basic blood work. Imaging pending at time of sign out.    FINAL CLINICAL  IMPRESSION(S) / ED DIAGNOSES   Final diagnoses:  Closed fracture of one rib of right side, initial encounter  Fall, initial encounter     Note:  This document was prepared using Dragon voice recognition software and may include unintentional dictation errors.    Nance Pear, MD 04/25/22 [id number]

## 2022-04-25 NOTE — ED Triage Notes (Signed)
Pt presents to the ED via POV due to a fall in the shower. Pt states he slipped on soap and fell backwards. +LOC "just for a second". Pt is currently on plavix. Pt states he is having back, neck and rib pain. C-collar applied in triage. Pt A&Ox4

## 2022-04-26 ENCOUNTER — Other Ambulatory Visit: Payer: Self-pay | Admitting: Family Medicine

## 2022-04-26 NOTE — Telephone Encounter (Signed)
Last office visit 04/01/22 for suture removal.  Last refilled 12/15/21 for #120 with 2 refills.  Next Appt: 04/07/23 for CPE.

## 2022-04-29 ENCOUNTER — Other Ambulatory Visit: Payer: Self-pay | Admitting: Family Medicine

## 2022-04-29 NOTE — Telephone Encounter (Signed)
Last office visit 04/01/22 for staple removal.  Last refilled 03/28/22 for #30 with no refills.  CPE scheduled for 04/07/23.

## 2022-05-01 ENCOUNTER — Other Ambulatory Visit: Payer: Self-pay | Admitting: Family Medicine

## 2022-05-01 NOTE — Telephone Encounter (Signed)
Received refill request for atorvastatin 80 mg but last office note at his CPE states he is taking 40 mg daily.   Please advise.

## 2022-05-02 ENCOUNTER — Telehealth: Payer: Self-pay | Admitting: Family Medicine

## 2022-05-02 NOTE — Telephone Encounter (Signed)
Patient called and stated he has some questions about the medication atorvastatin (LIPITOR) 80 MG tablet  and requested a call back. Call back number [phone]

## 2022-05-02 NOTE — Telephone Encounter (Signed)
Patient called to ask about his atorvastatin 80 mg refill that was sent in today with instructions to take 1/2 tablet daily.   Patient states he was been taking 80 mg daily for quite a while and doesn't remember it being changed to 1/2 tablet daily.  Please advise.

## 2022-05-03 ENCOUNTER — Other Ambulatory Visit: Payer: Medicare Other

## 2022-05-03 NOTE — Telephone Encounter (Signed)
Please resend in prescription: Atorvastatin 80 mg 1 tablet p.o. daily. #90, 3 RF Let patient know prescription corrected.  I am not sure why the change was made on the prescription.

## 2022-05-04 ENCOUNTER — Other Ambulatory Visit: Payer: Self-pay

## 2022-05-04 DIAGNOSIS — G4733 Obstructive sleep apnea (adult) (pediatric): Secondary | ICD-10-CM | POA: Diagnosis not present

## 2022-05-04 DIAGNOSIS — F5101 Primary insomnia: Secondary | ICD-10-CM | POA: Diagnosis not present

## 2022-05-04 DIAGNOSIS — Z9682 Presence of neurostimulator: Secondary | ICD-10-CM | POA: Diagnosis not present

## 2022-05-04 MED ORDER — ATORVASTATIN CALCIUM 80 MG PO TABS: 80.0000 mg | ORAL_TABLET | Freq: Every day | ORAL | 3 refills | Status: DC

## 2022-05-04 NOTE — Telephone Encounter (Signed)
Rx resent.

## 2022-05-09 ENCOUNTER — Other Ambulatory Visit: Payer: Self-pay | Admitting: Family Medicine

## 2022-05-09 NOTE — Telephone Encounter (Signed)
Last office visit 04/01/22 for Staple removal.  Last refilled 03/14/22 for 120 with 1 refill.  Next Appt: CPE scheduled 04/07/23.

## 2022-05-19 DIAGNOSIS — M5481 Occipital neuralgia: Secondary | ICD-10-CM | POA: Diagnosis not present

## 2022-05-19 DIAGNOSIS — F32A Depression, unspecified: Secondary | ICD-10-CM | POA: Diagnosis not present

## 2022-05-19 DIAGNOSIS — R251 Tremor, unspecified: Secondary | ICD-10-CM | POA: Diagnosis not present

## 2022-05-19 DIAGNOSIS — Z8679 Personal history of other diseases of the circulatory system: Secondary | ICD-10-CM | POA: Diagnosis not present

## 2022-05-19 DIAGNOSIS — F419 Anxiety disorder, unspecified: Secondary | ICD-10-CM | POA: Diagnosis not present

## 2022-05-19 DIAGNOSIS — R519 Headache, unspecified: Secondary | ICD-10-CM | POA: Diagnosis not present

## 2022-05-19 DIAGNOSIS — R202 Paresthesia of skin: Secondary | ICD-10-CM | POA: Diagnosis not present

## 2022-05-19 DIAGNOSIS — G4733 Obstructive sleep apnea (adult) (pediatric): Secondary | ICD-10-CM | POA: Diagnosis not present

## 2022-05-19 DIAGNOSIS — R200 Anesthesia of skin: Secondary | ICD-10-CM | POA: Diagnosis not present

## 2022-05-20 DIAGNOSIS — Z961 Presence of intraocular lens: Secondary | ICD-10-CM | POA: Diagnosis not present

## 2022-05-20 DIAGNOSIS — H47013 Ischemic optic neuropathy, bilateral: Secondary | ICD-10-CM | POA: Diagnosis not present

## 2022-05-20 DIAGNOSIS — H524 Presbyopia: Secondary | ICD-10-CM | POA: Diagnosis not present

## 2022-05-20 DIAGNOSIS — H40013 Open angle with borderline findings, low risk, bilateral: Secondary | ICD-10-CM | POA: Diagnosis not present

## 2022-05-20 DIAGNOSIS — H5213 Myopia, bilateral: Secondary | ICD-10-CM | POA: Diagnosis not present

## 2022-05-20 DIAGNOSIS — H52223 Regular astigmatism, bilateral: Secondary | ICD-10-CM | POA: Diagnosis not present

## 2022-05-20 DIAGNOSIS — D3142 Benign neoplasm of left ciliary body: Secondary | ICD-10-CM | POA: Diagnosis not present

## 2022-05-24 ENCOUNTER — Other Ambulatory Visit: Payer: Self-pay | Admitting: Family Medicine

## 2022-05-26 ENCOUNTER — Other Ambulatory Visit: Payer: Self-pay | Admitting: Family Medicine

## 2022-05-26 NOTE — Telephone Encounter (Signed)
Last office visit 04/01/22 for staple removal.  Last refilled 04/29/22 for #30 with no refills.  Next Appt: 04/07/23 for CPE.

## 2022-06-01 ENCOUNTER — Other Ambulatory Visit: Payer: Self-pay | Admitting: Family Medicine

## 2022-06-01 NOTE — Telephone Encounter (Signed)
Tamsulosin not on current medication list.  Refill?

## 2022-06-02 ENCOUNTER — Other Ambulatory Visit: Payer: Self-pay | Admitting: Family Medicine

## 2022-06-02 DIAGNOSIS — R21 Rash and other nonspecific skin eruption: Secondary | ICD-10-CM

## 2022-06-02 MED ORDER — TRIAMCINOLONE ACETONIDE 0.1 % EX CREA: 1.0000 | TOPICAL_CREAM | Freq: Two times a day (BID) | CUTANEOUS | 0 refills | Status: DC | PRN

## 2022-06-02 MED ORDER — FERROUS SULFATE 325 (65 FE) MG PO TABS: ORAL_TABLET | ORAL | 3 refills | Status: DC

## 2022-06-02 NOTE — Telephone Encounter (Signed)
Last office visit 04/01/22 for staple removal.  Last refilled 10/07/20 for 454 g with no refills.  Next Appt: CPE 04/07/2023.

## 2022-06-02 NOTE — Telephone Encounter (Signed)
Prescription Request  06/02/2022  Is this a "Controlled Substance" medicine? No  LOV: 04/01/2022  What is the name of the medication or equipment?  1.  triamcinolone cream (KENALOG) 0.1 %  2.  ferrous sulfate 325 (65 FE) MG tablet   Have you contacted your pharmacy to request a refill? No   Which pharmacy would you like this sent to?  [facility] [id number], [place] [address] Phone: [phone] Fax: [phone]   Patient notified that their request is being sent to the clinical staff for review and that they should receive a response within 2 business days.   Please advise at Mobile [phone] (mobile)

## 2022-06-13 NOTE — Progress Notes (Unsigned)
Cardiology Office Note  Date:  06/14/2022   ID:  Shawn Lam, DOB [birth year], MRN [id number]  PCP:  Jinny Sanders, MD   Chief Complaint  Patient presents with   6 month follow up     Patient c/o some dizziness starting about 1 year ago, falls in the past from orthostatic hypotension, last fall was 04/23/22. Medications reviewed by the patient verbally.     HPI:  Shawn Lam is a 72 y.o. male with a hx of  CAD s/p PCI of the LAD and RCA,  orthostatic hypotension,  dyslipidemia EF of 50-55% with inferior HK.  lymphedema  mildly dilated aortic root at 38 mm by echo 2018,  moderate OSA, inspire device 12/08/2017 Chronic renal insufficiency creatinine 1.74 Who presents for routine follow-up of his coronary artery disease, orthostatic hypotension  Last seen by myself January 2023 Seen by one of our providers September 2023 Echo May 2023 EF 50%, no significant valvular heart disease Stress test performed June 2023, low risk study  Fall March 23, 2022 Mechanical fall, hit his head  On midodrine 3 times a day  Seen in the emergency room April 25, 2022,  Fall, notes detailing that he "slipped on soap in the shower, fell backwards on right side hit head and neck against shower seat, possible brief loss of consciousness" Blood pressure stable in the ER 127/82 9th right rib fracture  On further discussion today, may have had a dizzy spell  Seen by neurology for his dizzy spells, started on primidone 100 mg daily Having "bad neck pain", seen by orthopedics Continues midodrine 10 mg 3 times daily  Active at baseline, jogs, squats, daily  Feels he is losing weight Weight down 22 pounds from prior clinic visit last year Eating less  EKG personally reviewed by myself on todays visit Normal sinus rhythm rate 68 bpm no significant ST-T wave changes  other past medical history reviewed Last seen in clinic July 16, 2019 COVID September 2021 Seen in the emergency room  February 07, 2020 for chest pain Diagnosed with bronchitis, chronic respiratory failure with hypoxia  prior fall, fractured his left forearm  Echocardiogram with ejection fraction 60 to 65% in August 2020  Other past medical history reviewed In the hospital 11/2018 GI bleeding and chest pain vasovagal episode 2 days ago in the setting of GI bleed and chronic hypotension EGD with no complications  Clean-based ulcer no signs of clot or bleeding continue Florinef and midodrine at outpatient   Previous trial of CPAP but was intolerant and then was referred to ENT for consideration of inspire device.  On 12/08/2017 he underwent placement of inspire device without complication.     PMH:   has a past medical history of Actinic keratosis (02/04/2021), Anemia, Anxiety, Asymptomatic LV dysfunction, Berger's disease, CAD (coronary artery disease) (06/2005), Depression, Dilated aortic root ([place]), Hematuria, Hyperlipidemia, Hypothyroidism, Insomnia, Left ureteral stone, Orthostatic hypotension, OSA (obstructive sleep apnea), PVC's (premature ventricular contractions) (07/07/2016), Renal disorder, Rosacea, and Urticaria.  PSH:    Past Surgical History:  Procedure Laterality Date   CARDIAC CATHETERIZATION  09/06/2013   [facility]   CARDIAC CATHETERIZATION     COLONOSCOPY WITH PROPOFOL N/A 01/21/2019   Procedure: COLONOSCOPY WITH PROPOFOL;  Surgeon: Lucilla Lame, MD;  Location: [place];  Service: Endoscopy;  Laterality: N/A;  sleep apnea   COLONOSCOPY WITH PROPOFOL N/A 10/19/2021   Procedure: COLONOSCOPY WITH PROPOFOL;  Surgeon: Lucilla Lame, MD;  Location: [facility] ENDOSCOPY;  Service: Endoscopy;  Laterality: N/A;   CORONARY ANGIOPLASTY  2004   STENT PLACEMENT   CYSTOSCOPY WITH RETROGRADE PYELOGRAM, URETEROSCOPY AND STENT PLACEMENT Left 03/19/2014   Procedure: CYSTOSCOPY WITH RETROGRADE PYELOGRAM, URETEROSCOPY AND STENT PLACEMENT;  Surgeon: Junious Dresser, MD;  Location: [facility];  Service: Urology;   Laterality: Left;   CYSTOSCOPY WITH RETROGRADE PYELOGRAM, URETEROSCOPY AND STENT PLACEMENT Bilateral 05/20/2015   Procedure:  CYSTOSCOPY WITH BILATERAL RETROGRADE PYELOGRAM,RIGHT  DIAGNOSTIC URETEROSCOPY ,LEFT URETEROSCOPY WITH HOLMIUM LASER  AND BILATERAL  STENT PLACEMENT ;  Surgeon: Alexis Frock, MD;  Location: [facility];  Service: Urology;  Laterality: Bilateral;   DRUG INDUCED ENDOSCOPY N/A 10/20/2017   Procedure: DRUG INDUCED SLEEP ENDOSCOPY;  Surgeon: Melida Quitter, MD;  Location: [place];  Service: ENT;  Laterality: N/A;   EP IMPLANTABLE DEVICE N/A 05/13/2016   Procedure: Loop Recorder Insertion;  Surgeon: Deboraha Sprang, MD;  Location: [place] [facility];  Service: Cardiovascular;  Laterality: N/A;   ESOPHAGOGASTRODUODENOSCOPY N/A 10/19/2021   Procedure: ESOPHAGOGASTRODUODENOSCOPY (EGD);  Surgeon: Lucilla Lame, MD;  Location: [facility] ENDOSCOPY;  Service: Endoscopy;  Laterality: N/A;   ESOPHAGOGASTRODUODENOSCOPY (EGD) WITH PROPOFOL N/A 11/30/2018   Procedure: ESOPHAGOGASTRODUODENOSCOPY (EGD) WITH PROPOFOL;  Surgeon: Jonathon Bellows, MD;  Location: [facility] ENDOSCOPY;  Service: Gastroenterology;  Laterality: N/A;   ESOPHAGOGASTRODUODENOSCOPY (EGD) WITH PROPOFOL N/A 12/01/2018   Procedure: ESOPHAGOGASTRODUODENOSCOPY (EGD) WITH PROPOFOL;  Surgeon: Lucilla Lame, MD;  Location: [facility] ENDOSCOPY;  Service: Endoscopy;  Laterality: N/A;   HOLMIUM LASER APPLICATION Bilateral [id number]   Procedure: HOLMIUM LASER APPLICATION;  Surgeon: Alexis Frock, MD;  Location[facility];  Service: Urology;  Laterality: Bilateral;   IMPLIMENTATION OF A HYPOGLOSSAL NERVE STIMULATOR  12/08/2017   OSA    LEFT HEART CATHETERIZATION WITH CORONARY ANGIOGRAM N/A 09/06/2013   Procedure: LEFT HEART CATHETERIZATION WITH CORONARY ANGIOGRAM;  Surgeon: Sinclair Grooms, MD;  Location: [facility] C[facility] LAB;  Service: Cardiovascular;  Laterality: N/A;   LEFT KNEE CAP  SURGERY ABOUT 40 YRS AGO  ? 1970     STONE EXTRACTION WITH BASKET Left  03/19/2014   Procedure: STONE EXTRACTION WITH BASKET;  Surgeon: Junious Dresser, MD;  Location[facility];  Service: Urology;  Laterality: Left;   TRANSURETHRAL RESECTION OF PROSTATE N/A 08/25/2021   Procedure: TRANSURETHRAL RESECTION OF THE PROSTATE (TURP);  Surgeon: Alexis Frock, MD;  Location[facility];  Service: Urology;  Laterality: N/A;  1 HR    Current Outpatient Medications  Medication Sig Dispense Refill   aspirin EC 81 MG tablet Take 81 mg by mouth daily.     atorvastatin (LIPITOR) 80 MG tablet Take 1 tablet (80 mg total) by mouth daily. 90 tablet 3   buPROPion (WELLBUTRIN XL) 150 MG 24 hr tablet Take 2 tablets (300 mg total) by mouth daily. 60 tablet 5   busPIRone (BUSPAR) 10 MG tablet Take 20 mg by mouth 3 (three) times daily.     cyanocobalamin (VITAMIN B12) 1000 MCG tablet Take 1,000 mcg by mouth daily.     denosumab (PROLIA) 60 MG/ML SOSY injection Inject 60 mg into the skin every 6 (six) months. Do not send RX, order this from the office     diclofenac (VOLTAREN) 75 MG EC tablet Take 75 mg by mouth 2 (two) times daily.     DULoxetine (CYMBALTA) 30 MG capsule TAKE THREE CAPSULES BY MOUTH DAILY 90 capsule 5   Elastic Bandages & Supports (ABDOMINAL BINDER/ELASTIC LARGE) MISC 1 Units by Does not apply route daily. 1 each 1  ferrous sulfate 325 (65 FE) MG tablet TAKE 1 TABLET BY MOUTH 3 TIMES DAILY WITH MEALS. 270 tablet 3   finasteride (PROSCAR) 5 MG tablet Take 5 mg by mouth daily.     gabapentin (NEURONTIN) 100 MG capsule TAKE 2 CAPSULES BY MOUTH TWICE A DAY 120 capsule 1   levothyroxine (SYNTHROID) 88 MCG tablet Take 1 tablet (88 mcg total) by mouth daily before breakfast. 30 tablet 11   Multiple Vitamins-Minerals (MULTIVITAMIN WITH MINERALS) tablet Take 1 tablet by mouth daily.     nitroGLYCERIN (NITROSTAT) 0.4 MG SL tablet Place 1 tablet (0.4 mg total) under the tongue every 5 (five) minutes as needed for chest pain. 10 tablet 1   primidone (MYSOLINE) 50 MG tablet Take 50 mg  by mouth 2 (two) times daily.     sucralfate (CARAFATE) 1 g tablet TAKE 1 TABLET BY MOUTH 4 TIMES A DAY WITH MEALS AND AT BEDTIME 120 tablet 2   tamsulosin (FLOMAX) 0.4 MG CAPS capsule TAKE TWO CAPSULES BY MOUTH DAILY 180 capsule 3   Teriparatide, Recombinant, (FORTEO) 600 MCG/2.4ML SOPN Inject into the skin every 6 (six) months.     traMADol (ULTRAM) 50 MG tablet Take 50 mg by mouth at bedtime.     triamcinolone cream (KENALOG) 0.1 % Apply 1 Application topically 2 (two) times daily as needed (irritation). Avoid face, groin, underarms. 454 g 0   zolpidem (AMBIEN) 10 MG tablet TAKE ONE TABLET BY MOUTH EVERY NIGHT AT BEDTIME AS NEEDED 30 tablet 0   clopidogrel (PLAVIX) 75 MG tablet Take 1 tablet (75 mg total) by mouth daily. 90 tablet 3   lidocaine (LIDODERM) 5 % Place 1 patch onto the skin every 12 (twelve) hours. Remove & Discard patch within 12 hours or as directed by MD (Patient not taking: Reported on 06/14/2022) 10 patch 0   midodrine (PROAMATINE) 10 MG tablet Take 1 tablet (10 mg total) by mouth 3 (three) times daily. 270 tablet 3   oxyCODONE (ROXICODONE) 5 MG immediate release tablet Take 1 tablet (5 mg total) by mouth every 6 (six) hours as needed for severe pain. (Patient not taking: Reported on 06/14/2022) 12 tablet 0   No current facility-administered medications for this visit.     Allergies:   Codeine and Tape   Social History:  The patient  reports that he has never smoked. He has never used smokeless tobacco. He reports that he does not drink alcohol and does not use drugs.   Family History:   family history includes Cancer in his father; Coronary artery disease in his mother; Diabetes in his brother; Heart attack in his mother; Heart disease in his mother; Hypertension in his mother; Lung cancer in his father.    Review of Systems: Review of Systems  Constitutional: Negative.   HENT: Negative.    Respiratory: Negative.    Cardiovascular:  Positive for palpitations.   Gastrointestinal: Negative.   Musculoskeletal: Negative.   Neurological: Negative.   Psychiatric/Behavioral: Negative.    All other systems reviewed and are negative.   PHYSICAL EXAM: VS:  BP 118/68 (BP Location: Left Arm, Patient Position: Sitting, Cuff Size: Normal)   Pulse 68   Ht 6' (1.829 m)   Wt 148 lb 2 oz (67.2 kg)   SpO2 97%   BMI 20.09 kg/m  , BMI Body mass index is 20.09 kg/m. Constitutional:  oriented to person, place, and time. No distress.  HENT:  Head: Grossly normal Eyes:  no discharge. No scleral icterus.  Neck: No JVD, no carotid bruits  Cardiovascular: Regular rate and rhythm, no murmurs appreciated Pulmonary/Chest: Clear to auscultation bilaterally, no wheezes or rails Abdominal: Soft.  no distension.  no tenderness.  Musculoskeletal: Normal range of motion Neurological:  normal muscle tone. Coordination normal. No atrophy Skin: Skin warm and dry Psychiatric: normal affect, pleasant  Recent Labs: 03/22/2022: TSH 5.49 03/23/2022: ALT 44 04/25/2022: BUN 29; Creatinine, Ser 1.74; Hemoglobin 14.0; Platelets 230; Potassium 4.9; Sodium 138    Lipid Panel Lab Results  Component Value Date   CHOL 153 03/22/2022   HDL 72.40 03/22/2022   LDLCALC 66 03/22/2022   TRIG 71.0 03/22/2022      Wt Readings from Last 3 Encounters:  06/14/22 148 lb 2 oz (67.2 kg)  04/25/22 148 lb 5.9 oz (67.3 kg)  04/01/22 148 lb 6 oz (67.3 kg)     ASSESSMENT AND PLAN:  Problem List Items Addressed This Visit       Cardiology Problems   CAD (coronary artery disease) - Primary (Chronic)   Relevant Medications   aspirin EC 81 MG tablet   midodrine (PROAMATINE) 10 MG tablet   Other Relevant Orders   EKG 12-Lead   PVC's (premature ventricular contractions)   Relevant Medications   aspirin EC 81 MG tablet   midodrine (PROAMATINE) 10 MG tablet   Other Relevant Orders   EKG 12-Lead     Other   OSA (obstructive sleep apnea) (Chronic)   CKD (chronic kidney disease),  stage IIIa (Chronic)   Other Visit Diagnoses     Orthostatic syncope       Hyperlipidemia LDL goal <70       Relevant Medications   aspirin EC 81 MG tablet   midodrine (PROAMATINE) 10 MG tablet   Syncope, unspecified syncope type           1.  OSA -   intolerant to CPAP therapy  inspire device implanted 2019, symptoms are stable   2. CAD -  with stable angina s/p remote PCI of the LAD and RCA.   Currently with no symptoms of angina. No further workup at this time. Continue current medication regimen.   3.  Orthostatic hypotension  Continue midodrine 10 mg 3 times daily Recommend he take pill first thing in the morning with large amount of beverage -Of concern is 22 pound weight loss over the past year, recommend he try to curb his weight loss as blood pressure will run lower with lower weight  4.  Hyperlipidemia - Cholesterol is at goal on the current lipid regimen. No changes to the medications were made.  5.  Palpitations Symptoms controlled on today's visit Possibly from ectopy.  If symptoms get worse, Zio monitor could be ordered  6. COVID-19 pneumonia  infection in September 2021 Recovered  7.  Falls Seen in the ER December 2023 in January 2024 after fall Unclear if purely mechanical or if associated with dizziness Recommend he continue midodrine with fluids as above, stay hydrated.  Exercise program for leg strengthening.  For any warning of dizziness would sit down or lay down immediately   Total encounter time more than 30 minutes  Greater than 50% was spent in counseling and coordination of care with the patient    Signed, Esmond Plants, M.D., Ph.D. [place], [place]

## 2022-06-14 ENCOUNTER — Ambulatory Visit: Payer: Medicare Other | Attending: Cardiovascular Disease | Admitting: Cardiovascular Disease

## 2022-06-14 ENCOUNTER — Encounter: Payer: Self-pay | Admitting: Cardiovascular Disease

## 2022-06-14 VITALS — BP 118/68 | HR 68 | Ht 72.0 in | Wt 148.1 lb

## 2022-06-14 DIAGNOSIS — R809 Proteinuria, unspecified: Secondary | ICD-10-CM | POA: Diagnosis not present

## 2022-06-14 DIAGNOSIS — I951 Orthostatic hypotension: Secondary | ICD-10-CM | POA: Diagnosis not present

## 2022-06-14 DIAGNOSIS — I2510 Atherosclerotic heart disease of native coronary artery without angina pectoris: Secondary | ICD-10-CM | POA: Diagnosis not present

## 2022-06-14 DIAGNOSIS — N2581 Secondary hyperparathyroidism of renal origin: Secondary | ICD-10-CM | POA: Diagnosis not present

## 2022-06-14 DIAGNOSIS — N1831 Chronic kidney disease, stage 3a: Secondary | ICD-10-CM | POA: Diagnosis not present

## 2022-06-14 DIAGNOSIS — E785 Hyperlipidemia, unspecified: Secondary | ICD-10-CM | POA: Diagnosis not present

## 2022-06-14 DIAGNOSIS — N183 Chronic kidney disease, stage 3 unspecified: Secondary | ICD-10-CM | POA: Diagnosis not present

## 2022-06-14 DIAGNOSIS — N1832 Chronic kidney disease, stage 3b: Secondary | ICD-10-CM | POA: Diagnosis not present

## 2022-06-14 DIAGNOSIS — G4733 Obstructive sleep apnea (adult) (pediatric): Secondary | ICD-10-CM | POA: Diagnosis not present

## 2022-06-14 DIAGNOSIS — R55 Syncope and collapse: Secondary | ICD-10-CM

## 2022-06-14 DIAGNOSIS — I493 Ventricular premature depolarization: Secondary | ICD-10-CM | POA: Diagnosis not present

## 2022-06-14 MED ORDER — CLOPIDOGREL BISULFATE 75 MG PO TABS: 75.0000 mg | ORAL_TABLET | Freq: Every day | ORAL | 3 refills | Status: DC

## 2022-06-14 MED ORDER — MIDODRINE HCL 10 MG PO TABS: 10.0000 mg | ORAL_TABLET | Freq: Three times a day (TID) | ORAL | 3 refills | Status: DC

## 2022-06-14 NOTE — Patient Instructions (Signed)
Medication Instructions:  No changes  If you need a refill on your cardiac medications before your next appointment, please call your pharmacy.   Lab work: No new labs needed  Testing/Procedures: No new testing needed  Follow-Up: At [facility], you and your health needs are our priority.  As part of our continuing mission to provide you with exceptional heart care, we have created designated Provider Care Teams.  These Care Teams include your primary Cardiologist (physician) and Advanced Practice Providers (APPs -  Physician Assistants and Nurse Practitioners) who all work together to provide you with the care you need, when you need it.  You will need a follow up appointment in 12 months  Providers on your designated Care Team:   Christopher Berge, NP Ryan Dunn, PA-C Cadence Furth, PA-C  COVID-19 Vaccine Information can be found at: [email or web address] For questions related to vaccine distribution or appointments, please email [email or web address] or call [phone].   

## 2022-06-17 ENCOUNTER — Telehealth: Payer: Self-pay

## 2022-06-17 NOTE — Progress Notes (Signed)
Care Management & Coordination Services Pharmacy Team  Reason for Encounter: General adherence update   Contacted patient for general health update and medication adherence call.  Spoke with patient on 06/17/2022    What concerns do you have about your medications? Patient stated everything was going good and has no concerns at this time.   The patient denies side effects with their medications.   How often do you forget or accidentally miss a dose? Rarely  Do you use a pillbox? Yes  Are you having any problems getting your medications from your pharmacy? No  Has the cost of your medications been a concern? No  Since last visit with PharmD, the following interventions have been made.  Change: Levothyroxine 88 mcg  The patient has had an ED visit since last contact.   The patient denies problems with their health.   Patient denies concerns or questions for Charlene Brooke, PharmD at this time.   Chart Updates:  Recent office visits:  04/01/22 [name], MD Laceration of forehead Suture/Staple removal 03/29/22 AWV Ordered: DG Bone Density Change: Levothyroxine 88 mcg F/U 4 - 6 weeks  Recent consult visits:  06/14/22 Munsoor Lateef, MD (Oncology) CKD No med changes F/U 4 months 06/14/22 Ida Rogue, MD (Cardiology) Coronary artery disease Ordered: EKG Stop: Furosemide 20 mg F/U 12 months 05/19/22 Jennings Books, MD Chronic Daily Headache Start: Mysoline 50 mg Start: TENS unit F/U 3 months 05/04/22 Ramonita Lab, NP (Sleep wellness center) OSA Repeat home sleep study in 6 months Jennings Books, MD Bilateral Occipital Neuralgia Procedure: Trigger Point Injections  04/13/22 [facility] visits:  Medication Reconciliation was completed by comparing discharge summary, patient's EMR and Pharmacy list, and upon discussion with patient.  Admitted to the ED on 04/25/2022 due to Fall. Discharge date was 04/25/2022. Discharged from [facility].  Closed fracture  of one rib of right side  Patient presented to the emergency department today after a fall in the shower. The patient is complaining of neck and right chest wall pain. CT head/cervical spine without concerning traumatic findings. Rib series with likely 9th right rib fracture. On exam patient was tender in the right upper abdomen as well. Given some concern for possible solid organ injury will obtain CT scan of the abd/pel. Additionally will get ct scan of the chest to rule out further rib injury. Patient does state he slipped so it does sound mechanical, however will check basic blood work. Imaging pending at time of sign out.    New?Medications Started at [facility] Discharge:?? lidocaine 5 % oxycodone 5 MG immediate release tablet   Medications that remain the same after Hospital Discharge:??  -All other medications will remain the same.    Medications: Outpatient Encounter Medications as of 06/17/2022  Medication Sig   aspirin EC 81 MG tablet Take 81 mg by mouth daily.   atorvastatin (LIPITOR) 80 MG tablet Take 1 tablet (80 mg total) by mouth daily.   buPROPion (WELLBUTRIN XL) 150 MG 24 hr tablet Take 2 tablets (300 mg total) by mouth daily.   busPIRone (BUSPAR) 10 MG tablet Take 20 mg by mouth 3 (three) times daily.   clopidogrel (PLAVIX) 75 MG tablet Take 1 tablet (75 mg total) by mouth daily.   cyanocobalamin (VITAMIN B12) 1000 MCG tablet Take 1,000 mcg by mouth daily.   denosumab (PROLIA) 60 MG/ML SOSY injection Inject 60 mg into the skin every 6 (six) months. Do not send RX, order this from the office  diclofenac (VOLTAREN) 75 MG EC tablet Take 75 mg by mouth 2 (two) times daily.   DULoxetine (CYMBALTA) 30 MG capsule TAKE THREE CAPSULES BY MOUTH DAILY   Elastic Bandages & Supports (ABDOMINAL BINDER/ELASTIC LARGE) MISC 1 Units by Does not apply route daily.   ferrous sulfate 325 (65 FE) MG tablet TAKE 1 TABLET BY MOUTH 3 TIMES DAILY WITH MEALS.   finasteride (PROSCAR) 5 MG tablet Take 5  mg by mouth daily.   gabapentin (NEURONTIN) 100 MG capsule TAKE 2 CAPSULES BY MOUTH TWICE A DAY   levothyroxine (SYNTHROID) 88 MCG tablet Take 1 tablet (88 mcg total) by mouth daily before breakfast.   lidocaine (LIDODERM) 5 % Place 1 patch onto the skin every 12 (twelve) hours. Remove & Discard patch within 12 hours or as directed by MD (Patient not taking: Reported on 06/14/2022)   midodrine (PROAMATINE) 10 MG tablet Take 1 tablet (10 mg total) by mouth 3 (three) times daily.   Multiple Vitamins-Minerals (MULTIVITAMIN WITH MINERALS) tablet Take 1 tablet by mouth daily.   nitroGLYCERIN (NITROSTAT) 0.4 MG SL tablet Place 1 tablet (0.4 mg total) under the tongue every 5 (five) minutes as needed for chest pain.   oxyCODONE (ROXICODONE) 5 MG immediate release tablet Take 1 tablet (5 mg total) by mouth every 6 (six) hours as needed for severe pain. (Patient not taking: Reported on 06/14/2022)   primidone (MYSOLINE) 50 MG tablet Take 50 mg by mouth 2 (two) times daily.   sucralfate (CARAFATE) 1 g tablet TAKE 1 TABLET BY MOUTH 4 TIMES A DAY WITH MEALS AND AT BEDTIME   tamsulosin (FLOMAX) 0.4 MG CAPS capsule TAKE TWO CAPSULES BY MOUTH DAILY   Teriparatide, Recombinant, (FORTEO) 600 MCG/2.4ML SOPN Inject into the skin every 6 (six) months.   traMADol (ULTRAM) 50 MG tablet Take 50 mg by mouth at bedtime.   triamcinolone cream (KENALOG) 0.1 % Apply 1 Application topically 2 (two) times daily as needed (irritation). Avoid face, groin, underarms.   zolpidem (AMBIEN) 10 MG tablet TAKE ONE TABLET BY MOUTH EVERY NIGHT AT BEDTIME AS NEEDED   No facility-administered encounter medications on file as of 06/17/2022.    Recent vitals BP Readings from Last 3 Encounters:  06/14/22 118/68  04/25/22 115/63  04/01/22 104/60   Pulse Readings from Last 3 Encounters:  06/14/22 68  04/25/22 95  04/01/22 75   Wt Readings from Last 3 Encounters:  06/14/22 148 lb 2 oz (67.2 kg)  04/25/22 148 lb 5.9 oz (67.3 kg)  04/01/22  148 lb 6 oz (67.3 kg)   BMI Readings from Last 3 Encounters:  06/14/22 20.09 kg/m  04/25/22 21.91 kg/m  04/01/22 21.91 kg/m    Recent lab results    Component Value Date/Time   NA 138 04/25/2022 1425   NA 144 10/06/2016 1450   K 4.9 04/25/2022 1425   CL 105 04/25/2022 1425   CO2 23 04/25/2022 1425   GLUCOSE 104 (H) 04/25/2022 1425   GLUCOSE 109 06/22/2009 1228   BUN 29 (H) 04/25/2022 1425   BUN 17 10/06/2016 1450   CREATININE 1.74 (H) 04/25/2022 1425   CALCIUM 9.3 04/25/2022 1425    Lab Results  Component Value Date   CREATININE 1.74 (H) 04/25/2022   GFR 38.10 (L) 03/22/2022   GFRNONAA 41 (L) 04/25/2022   GFRAA 60 (L) 01/08/2020   Lab Results  Component Value Date/Time   HGBA1C 5.1 08/11/2016 08:59 AM   HGBA1C 5.5 02/02/2016 07:52 AM   MICROALBUR 0.5 06/18/2008  10:37 AM    Lab Results  Component Value Date   CHOL 153 03/22/2022   HDL 72.40 03/22/2022   LDLCALC 66 03/22/2022   LDLDIRECT 57 06/22/2009   TRIG 71.0 03/22/2022   CHOLHDL 2 03/22/2022    Care Gaps: Annual wellness visit in last year? Yes 03/29/2022  Star Rating Drugs:  Medication:  Last Fill: Day Supply Atorvastatin 80 mg 06/14/22 90  Charlene Brooke, PharmD notified  Marijean Niemann, [place] Pharmacy Assistant [phone]

## 2022-06-20 ENCOUNTER — Telehealth: Payer: Self-pay | Admitting: Cardiovascular Disease

## 2022-06-20 NOTE — Telephone Encounter (Signed)
   Pre-operative Risk Assessment    Patient Name: Shawn Lam  DOB: 1951/03/22 MRN: [id number]      Request for Surgical Clearance    Procedure:   Renal Biopsy  Date of Surgery:  Clearance TBD                                 Surgeon:  Dr Maylene Roes Group or Practice Name:  [facility] Phone number:  [phone] Fax number:  [phone]   Type of Clearance Requested:   - Pharmacy:  Hold Clopidogrel (Plavix) 5 days prior to procedure   Type of Anesthesia:  Not Indicated   Additional requests/questions:    Manfred Arch   06/20/2022, 4:00 PM

## 2022-06-20 NOTE — Telephone Encounter (Signed)
   Patient Name: Shawn Lam  DOB: 09-09-1950 MRN: [id number]  Primary Cardiologist: Ida Rogue, MD  Chart reviewed as part of pre-operative protocol coverage.   Per office protocol, patient may hold Plavix for 5 days prior to procedure. Please resume Plavix as soon as possible postprocedure, at the discretion of the surgeon.   I will route this recommendation to the requesting party via Epic fax function and remove from pre-op pool.  Please call with questions.  Mable Fill, Marissa Nestle, NP 06/20/2022, 4:28 PM

## 2022-06-22 NOTE — Telephone Encounter (Signed)
I will re-fax notes to requesting office.

## 2022-06-22 NOTE — Telephone Encounter (Signed)
Patient is following up. He states [employer] has not received our recommendation. He is requesting to have it sent again if possible. He states a call back is not necessary.

## 2022-06-23 ENCOUNTER — Other Ambulatory Visit: Payer: Self-pay | Admitting: Nephrology

## 2022-06-23 DIAGNOSIS — R809 Proteinuria, unspecified: Secondary | ICD-10-CM

## 2022-06-23 DIAGNOSIS — N1832 Chronic kidney disease, stage 3b: Secondary | ICD-10-CM

## 2022-06-28 ENCOUNTER — Other Ambulatory Visit: Payer: Self-pay | Admitting: Family Medicine

## 2022-06-28 NOTE — Telephone Encounter (Signed)
Last office visit 04/01/22 for staple removal.  Last refilled 05/26/2022 for #30 with no refills.  Next appt: CPE 04/07/2023.

## 2022-06-30 ENCOUNTER — Telehealth: Payer: Self-pay

## 2022-06-30 NOTE — Progress Notes (Signed)
Care Management & Coordination Services Pharmacy Team  Reason for Encounter: Appointment Reminder  Contacted patient to confirm telephone appointment with Charlene Brooke, PharmD on 07/05/2022 at 11:00.  Unsuccessful outreach. Left voicemail for patient to return call.  Star Rating Drugs:  Medication:                Last Fill:         Day Supply Atorvastatin 80 mg   06/14/22          90  Care Gaps: Annual wellness visit in last year? Yes 03/29/2022  If Diabetic: Last eye exam / retinopathy screening: Last diabetic foot exam:  Charlene Brooke, PharmD notified  Marijean Niemann, [place] Pharmacy Assistant [phone]

## 2022-07-01 ENCOUNTER — Other Ambulatory Visit: Payer: Self-pay | Admitting: Student

## 2022-07-01 DIAGNOSIS — N183 Chronic kidney disease, stage 3 unspecified: Secondary | ICD-10-CM

## 2022-07-01 DIAGNOSIS — Z01818 Encounter for other preprocedural examination: Secondary | ICD-10-CM

## 2022-07-01 NOTE — Progress Notes (Signed)
Patient for US guided Renal Biopsy on Mon 07/04/2022, I called and spoke with the patient on the phone and gave pre-procedure instructions. Pt was made aware to be here at 7:30a, last dose of Plavix on Tues 06/28/2022 and last dose of ASA 81mg  on Thurs 06/30/2022,  NPO after MN prior to procedure as well as driver post procedure/recovery/discharge. Pt stated understanding.  Called 06/23/2022 and 07/01/2022

## 2022-07-04 ENCOUNTER — Ambulatory Visit
Admission: RE | Admit: 2022-07-04 | Discharge: 2022-07-04 | Disposition: A | Discharge status: Home / Self Care | Payer: Medicare Other | Source: Ambulatory Visit | Attending: Nephrology | Admitting: Nephrology

## 2022-07-04 DIAGNOSIS — I2510 Atherosclerotic heart disease of native coronary artery without angina pectoris: Secondary | ICD-10-CM | POA: Insufficient documentation

## 2022-07-04 DIAGNOSIS — N1832 Chronic kidney disease, stage 3b: Secondary | ICD-10-CM | POA: Diagnosis not present

## 2022-07-04 DIAGNOSIS — R809 Proteinuria, unspecified: Secondary | ICD-10-CM | POA: Insufficient documentation

## 2022-07-04 DIAGNOSIS — N183 Chronic kidney disease, stage 3 unspecified: Secondary | ICD-10-CM

## 2022-07-04 DIAGNOSIS — Z01818 Encounter for other preprocedural examination: Secondary | ICD-10-CM

## 2022-07-04 LAB — CBC
HCT: 41.3 % (ref 39.0–52.0)
Hemoglobin: 13.3 g/dL (ref 13.0–17.0)
MCH: 31.8 pg (ref 26.0–34.0)
MCHC: 32.2 g/dL (ref 30.0–36.0)
MCV: 98.8 fL (ref 80.0–100.0)
Platelets: 221 10*3/uL (ref 150–400)
RBC: 4.18 MIL/uL — ABNORMAL LOW (ref 4.22–5.81)
RDW: 13.6 % (ref 11.5–15.5)
WBC: 9 10*3/uL (ref 4.0–10.5)
nRBC: 0 % (ref 0.0–0.2)

## 2022-07-04 LAB — BASIC METABOLIC PANEL
Anion gap: 11 (ref 5–15)
BUN: 26 mg/dL — ABNORMAL HIGH (ref 8–23)
CO2: 25 mmol/L (ref 22–32)
Calcium: 9.5 mg/dL (ref 8.9–10.3)
Chloride: 102 mmol/L (ref 98–111)
Creatinine, Ser: 1.68 mg/dL — ABNORMAL HIGH (ref 0.61–1.24)
GFR, Estimated: 43 mL/min — ABNORMAL LOW (ref >60)
Glucose, Bld: 105 mg/dL — ABNORMAL HIGH (ref 70–99)
Potassium: 3.9 mmol/L (ref 3.5–5.1)
Sodium: 138 mmol/L (ref 135–145)

## 2022-07-04 LAB — PROTIME-INR
INR: 1 (ref 0.8–1.2)
Prothrombin Time: 12.8 seconds (ref 11.4–15.2)

## 2022-07-04 MED ORDER — FENTANYL CITRATE (PF) 100 MCG/2ML IJ SOLN
INTRAMUSCULAR | Status: AC | PRN
  Administered 2022-07-04: 50 ug via INTRAVENOUS
  Administered 2022-07-04: 25 ug via INTRAVENOUS

## 2022-07-04 MED ORDER — MIDAZOLAM HCL 2 MG/2ML IJ SOLN
INTRAMUSCULAR | Status: AC | PRN
  Administered 2022-07-04: .5 mg via INTRAVENOUS

## 2022-07-04 MED ORDER — LIDOCAINE HCL (PF) 1 % IJ SOLN
10.0000 mL | Freq: Once | INTRAMUSCULAR | Status: AC
  Administered 2022-07-04: 10 mL via INTRADERMAL

## 2022-07-04 MED ORDER — MIDAZOLAM HCL 2 MG/2ML IJ SOLN
INTRAMUSCULAR | Status: AC
  Filled 2022-07-04: qty 2

## 2022-07-04 MED ORDER — MIDAZOLAM HCL 5 MG/5ML IJ SOLN
INTRAMUSCULAR | Status: AC | PRN
  Administered 2022-07-04: 1 mg via INTRAVENOUS

## 2022-07-04 MED ORDER — FENTANYL CITRATE (PF) 100 MCG/2ML IJ SOLN
INTRAMUSCULAR | Status: AC
  Filled 2022-07-04: qty 2

## 2022-07-04 MED ORDER — SODIUM CHLORIDE 0.9 % IV SOLN: INTRAVENOUS | Status: DC

## 2022-07-04 NOTE — Procedures (Addendum)
Interventional Radiology Procedure Note  Date of Procedure: 07/04/2022  Procedure: [place] random renal biopsy   Findings:  1. [place] random renal biopsy right renal lower pole 16ga x3 cores   2. Gelfoam slurry   Complications: No immediate complications noted.   Estimated Blood Loss: minimal  Follow-up and Recommendations: 1. Bedrest 4 hours    Albin Felling, MD  Vascular & Interventional Radiology  07/04/2022 9:33 AM

## 2022-07-04 NOTE — H&P (Signed)
Chief Complaint: Patient was seen in consultation today for chronic kidney disease with unspecified proteinuria  Referring Physician(s): [place]  Supervising Physician: Juliet Rude  Patient Status: [facility] - Out-pt  History of Present Illness: Shawn Lam is a 72 y.o. male with PMH significant for anemia, anxiety, coronary artery disease, and obstructive sleep apnea being seen today in relation to chronic kidney disease stage 3b with unspecified proteinuria. The patient is followed by Dr Holley Raring from [facility]. Patient has been experiencing worsening renal function and has suspected IgA nephropathy, but he has never had a biopsy to confirm the suspected diagnosis. Patient referred to IR for image-guided renal biopsy.  Past Medical History:  Diagnosis Date   Actinic keratosis 02/04/2021   right lateral neck   Anemia    Anxiety    Asymptomatic LV dysfunction    50-55% by echo 06/2016   Berger's disease    CAD (coronary artery disease) 06/2005   PCI of LAD and RCA   Depression    Dilated aortic root (HCC)    4cm at sinus of Valsalva   Hematuria    Hyperlipidemia    Hypothyroidism    Insomnia    Left ureteral stone    Orthostatic hypotension    negative tilt table   OSA (obstructive sleep apnea)    moderate with AHI 17/hr, uses CPAP nightly(01/15/19 - has Inspire implant)   PVC's (premature ventricular contractions) 07/07/2016   Renal disorder    PT IS SEEING DR. Lorrene Reid- NEPHROLOGIST   Rosacea    Urticaria     Past Surgical History:  Procedure Laterality Date   CARDIAC CATHETERIZATION  09/06/2013   [facility]   CARDIAC CATHETERIZATION     COLONOSCOPY WITH PROPOFOL N/A 01/21/2019   Procedure: COLONOSCOPY WITH PROPOFOL;  Surgeon: Lucilla Lame, MD;  Location: [place];  Service: Endoscopy;  Laterality: N/A;  sleep apnea   COLONOSCOPY WITH PROPOFOL N/A 10/19/2021   Procedure: COLONOSCOPY WITH PROPOFOL;  Surgeon: Lucilla Lame, MD;   Location: [facility] ENDOSCOPY;  Service: Endoscopy;  Laterality: N/A;   CORONARY ANGIOPLASTY  2004   STENT PLACEMENT   CYSTOSCOPY WITH RETROGRADE PYELOGRAM, URETEROSCOPY AND STENT PLACEMENT Left 03/19/2014   Procedure: CYSTOSCOPY WITH RETROGRADE PYELOGRAM, URETEROSCOPY AND STENT PLACEMENT;  Surgeon: Junious Dresser, MD;  Location: [facility];  Service: Urology;  Laterality: Left;   CYSTOSCOPY WITH RETROGRADE PYELOGRAM, URETEROSCOPY AND STENT PLACEMENT Bilateral 05/20/2015   Procedure:  CYSTOSCOPY WITH BILATERAL RETROGRADE PYELOGRAM,RIGHT  DIAGNOSTIC URETEROSCOPY ,LEFT URETEROSCOPY WITH HOLMIUM LASER  AND BILATERAL  STENT PLACEMENT ;  Surgeon: Alexis Frock, MD;  Location: [facility];  Service: Urology;  Laterality: Bilateral;   DRUG INDUCED ENDOSCOPY N/A 10/20/2017   Procedure: DRUG INDUCED SLEEP ENDOSCOPY;  Surgeon: Melida Quitter, MD;  Location: [place];  Service: ENT;  Laterality: N/A;   EP IMPLANTABLE DEVICE N/A 05/13/2016   Procedure: Loop Recorder Insertion;  Surgeon: Deboraha Sprang, MD;  Location: [place] CV LAB;  Service: Cardiovascular;  Laterality: N/A;   ESOPHAGOGASTRODUODENOSCOPY N/A 10/19/2021   Procedure: ESOPHAGOGASTRODUODENOSCOPY (EGD);  Surgeon: Lucilla Lame, MD;  Location: [facility] ENDOSCOPY;  Service: Endoscopy;  Laterality: N/A;   ESOPHAGOGASTRODUODENOSCOPY (EGD) WITH PROPOFOL N/A 11/30/2018   Procedure: ESOPHAGOGASTRODUODENOSCOPY (EGD) WITH PROPOFOL;  Surgeon: Jonathon Bellows, MD;  Location: [facility] ENDOSCOPY;  Service: Gastroenterology;  Laterality: N/A;   ESOPHAGOGASTRODUODENOSCOPY (EGD) WITH PROPOFOL N/A 12/01/2018   Procedure: ESOPHAGOGASTRODUODENOSCOPY (EGD) WITH PROPOFOL;  Surgeon: Lucilla Lame, MD;  Location: [facility] ENDOSCOPY;  Service:  Endoscopy;  Laterality: N/A;   HOLMIUM LASER APPLICATION Bilateral [id number]   Procedure: HOLMIUM LASER APPLICATION;  Surgeon: Alexis Frock, MD;  Location: [facility];  Service: Urology;  Laterality: Bilateral;   IMPLIMENTATION OF A HYPOGLOSSAL  NERVE STIMULATOR  12/08/2017   OSA    LEFT HEART CATHETERIZATION WITH CORONARY ANGIOGRAM N/A 09/06/2013   Procedure: LEFT HEART CATHETERIZATION WITH CORONARY ANGIOGRAM;  Surgeon: Sinclair Grooms, MD;  Location: [facility] CATH LAB;  Service: Cardiovascular;  Laterality: N/A;   LEFT KNEE CAP  SURGERY ABOUT 40 YRS AGO  ? 1970     STONE EXTRACTION WITH BASKET Left 03/19/2014   Procedure: STONE EXTRACTION WITH BASKET;  Surgeon: Junious Dresser, MD;  Location: [facility];  Service: Urology;  Laterality: Left;   TRANSURETHRAL RESECTION OF PROSTATE N/A 08/25/2021   Procedure: TRANSURETHRAL RESECTION OF THE PROSTATE (TURP);  Surgeon: Alexis Frock, MD;  Location: [facility];  Service: Urology;  Laterality: N/A;  1 HR    Allergies: Codeine and Tape  Medications: Prior to Admission medications   Medication Sig Start Date End Date Taking? Authorizing Provider  aspirin EC 81 MG tablet Take 81 mg by mouth daily. 10/02/18  Yes [provider]  atorvastatin (LIPITOR) 80 MG tablet Take 1 tablet (80 mg total) by mouth daily. 05/04/22  Yes Bedsole, Amy E, MD  buPROPion (WELLBUTRIN XL) 150 MG 24 hr tablet Take 2 tablets (300 mg total) by mouth daily. 05/24/22  Yes Bedsole, Amy E, MD  busPIRone (BUSPAR) 10 MG tablet Take 1 tablet (10 mg total) by mouth 3 (three) times daily. 06/28/22  Yes Bedsole, Amy E, MD  clopidogrel (PLAVIX) 75 MG tablet Take 1 tablet (75 mg total) by mouth daily. 06/14/22  Yes Gollan, Kathlene November, MD  cyanocobalamin (VITAMIN B12) 1000 MCG tablet Take 1,000 mcg by mouth daily. 10/02/18  Yes [provider]  denosumab (PROLIA) 60 MG/ML SOSY injection Inject 60 mg into the skin every 6 (six) months. Do not send RX, order this from the office   Yes [provider]  diclofenac (VOLTAREN) 75 MG EC tablet Take 75 mg by mouth 2 (two) times daily. 11/30/17  Yes [provider]  DULoxetine (CYMBALTA) 30 MG capsule TAKE THREE CAPSULES BY MOUTH DAILY 04/24/22  Yes Bedsole, Amy E, MD   Elastic Bandages & Supports (ABDOMINAL BINDER/ELASTIC LARGE) MISC 1 Units by Does not apply route daily. 12/10/21  Yes Dunn, Areta Haber, PA-C  ferrous sulfate 325 (65 FE) MG tablet TAKE 1 TABLET BY MOUTH 3 TIMES DAILY WITH MEALS. 06/02/22  Yes Bedsole, Amy E, MD  finasteride (PROSCAR) 5 MG tablet Take 5 mg by mouth daily. 01/22/21  Yes [provider]  gabapentin (NEURONTIN) 100 MG capsule TAKE 2 CAPSULES BY MOUTH TWICE A DAY 05/09/22  Yes Bedsole, Amy E, MD  levothyroxine (SYNTHROID) 88 MCG tablet Take 1 tablet (88 mcg total) by mouth daily before breakfast. 03/29/22  Yes Bedsole, Amy E, MD  midodrine (PROAMATINE) 10 MG tablet Take 1 tablet (10 mg total) by mouth 3 (three) times daily. 06/14/22  Yes Gollan, Kathlene November, MD  Multiple Vitamins-Minerals (MULTIVITAMIN WITH MINERALS) tablet Take 1 tablet by mouth daily.   Yes [provider]  primidone (MYSOLINE) 50 MG tablet Take 50 mg by mouth 2 (two) times daily. 05/31/22  Yes [provider]  sucralfate (CARAFATE) 1 g tablet TAKE 1 TABLET BY MOUTH 4 TIMES A DAY WITH MEALS AND AT BEDTIME 04/26/22  Yes Jinny Sanders, MD  tamsulosin (FLOMAX) 0.4 MG CAPS capsule TAKE TWO CAPSULES BY MOUTH DAILY 06/01/22  Yes Bedsole, Amy E, MD  Teriparatide, Recombinant, (FORTEO) 600 MCG/2.4ML SOPN Inject into the skin every 6 (six) months. 12/28/16  Yes [provider]  triamcinolone cream (KENALOG) 0.1 % Apply 1 Application topically 2 (two) times daily as needed (irritation). Avoid face, groin, underarms. 06/02/22  Yes Bedsole, Amy E, MD  zolpidem (AMBIEN) 10 MG tablet TAKE 1 TABLET BY MOUTH EVERY NIGHT AT BEDTIME AS NEEDED 06/28/22  Yes Bedsole, Amy E, MD  lidocaine (LIDODERM) 5 % Place 1 patch onto the skin every 12 (twelve) hours. Remove & Discard patch within 12 hours or as directed by MD Patient not taking: Reported on 06/14/2022 04/25/22 04/25/23  Merlyn Lot, MD  nitroGLYCERIN (NITROSTAT) 0.4 MG SL tablet Place 1 tablet (0.4 mg total)  under the tongue every 5 (five) minutes as needed for chest pain. 03/26/21   Bedsole, Amy E, MD  oxyCODONE (ROXICODONE) 5 MG immediate release tablet Take 1 tablet (5 mg total) by mouth every 6 (six) hours as needed for severe pain. Patient not taking: Reported on 06/14/2022 04/25/22   Nance Pear, MD  traMADol (ULTRAM) 50 MG tablet Take 50 mg by mouth at bedtime. Patient not taking: Reported on 07/04/2022 10/02/18   [provider]     Family History  Problem Relation Age of Onset   Coronary artery disease Mother    Heart attack Mother    Heart disease Mother    Hypertension Mother    Cancer Father        lung and colon   Lung cancer Father        smoker   Diabetes Brother     Social History   Socioeconomic History   Marital status: Married    Spouse name: Not on file   Number of children: Not on file   Years of education: Not on file   Highest education level: Not on file  Occupational History   Occupation: retired Print production planner  Tobacco Use   Smoking status: Never   Smokeless tobacco: Never  Vaping Use   Vaping Use: Never used  Substance and Sexual Activity   Alcohol use: Never    Alcohol/week: 1.0 standard drink of alcohol    Types: 1 Cans of beer per week   Drug use: No   Sexual activity: Yes    Partners: Female    Birth control/protection: None  Other Topics Concern   Not on file  Social History Narrative   Regular exercise--yes--jog 5 miles 6 days a week   Social Determinants of Health   Financial Resource Strain: Low Risk  (03/29/2022)   Overall Financial Resource Strain (CARDIA)    Difficulty of Paying Living Expenses: Not hard at all  Food Insecurity: No Food Insecurity (03/29/2022)   Hunger Vital Sign    Worried About Running Out of Food in the Last Year: Never true    Ran Out of Food in the Last Year: Never true  Transportation Needs: No Transportation Needs (03/29/2022)   PRAPARE - Hydrologist (Medical): No     Lack of Transportation (Non-Medical): No  Physical Activity: Sufficiently Active (03/29/2022)   Exercise Vital Sign    Days of Exercise per Week: 7 days    Minutes of Exercise per Session: 60 min  Stress: No Stress Concern Present (03/29/2022)   [place]    Feeling  of Stress : Not at all  Social Connections: Moderately Isolated (03/29/2022)   Social Connection and Isolation Panel [NHANES]    Frequency of Communication with Friends and Family: More than three times a week    Frequency of Social Gatherings with Friends and Family: More than three times a week    Attends Religious Services: Never    Marine scientist or Organizations: No    Attends Music therapist: Never    Marital Status: Married    Code Status: Full code  Review of Systems: A 12 point ROS discussed and pertinent positives are indicated in the HPI above.  All other systems are negative.  Review of Systems  Constitutional:  Negative for chills and fever.  Respiratory:  Negative for chest tightness and shortness of breath.   Cardiovascular:  Negative for chest pain and leg swelling.  Gastrointestinal:  Negative for abdominal pain, diarrhea, nausea and vomiting.  Neurological:  Positive for headaches. Negative for dizziness.  Psychiatric/Behavioral:  Negative for confusion.     Vital Signs: BP (!) 155/81   Pulse 75   Temp 97.7 F (36.5 C)   Resp 20   Ht 6' (1.829 m)   Wt 150 lb (68 kg)   SpO2 98%   BMI 20.34 kg/m    Physical Exam Vitals reviewed.  Constitutional:      General: He is not in acute distress.    Appearance: He is not ill-appearing.  HENT:     Mouth/Throat:     Mouth: Mucous membranes are moist.  Eyes:     Pupils: Pupils are equal, round, and reactive to light.  Cardiovascular:     Rate and Rhythm: Normal rate and regular rhythm.     Pulses: Normal pulses.     Heart sounds: Normal heart sounds.   Pulmonary:     Effort: Pulmonary effort is normal.     Breath sounds: Normal breath sounds.  Abdominal:     General: Bowel sounds are normal.     Palpations: Abdomen is soft.     Tenderness: There is no abdominal tenderness.  Musculoskeletal:     Right lower leg: No edema.     Left lower leg: No edema.  Skin:    General: Skin is warm and dry.  Neurological:     Mental Status: He is alert and oriented to person, place, and time.  Psychiatric:        Mood and Affect: Mood normal.        Behavior: Behavior normal.        Thought Content: Thought content normal.        Judgment: Judgment normal.     Imaging: No results found.  Labs:  CBC: Recent Labs    12/28/21 1625 03/23/22 2245 04/25/22 1425 07/04/22 0758  WBC 5.4 6.0 8.8 9.0  HGB 13.7 12.9* 14.0 13.3  HCT 40.7 41.3 45.0 41.3  PLT 198.0 215 230 221    COAGS: No results for input(s): "INR", "APTT" in the last 8760 hours.  BMP: Recent Labs    08/16/21 1408 08/26/21 0505 01/20/22 1502 03/22/22 0850 03/23/22 2245 04/25/22 1425  NA 144 140 144 141 142 138  K 3.3* 3.7 4.6 4.7 4.3 4.9  CL 110 107 106 105 108 105  CO2 27 28 29 30 29 23   GLUCOSE 86 128* 94 82 109* 104*  BUN 13 16 16 20 22  29*  CALCIUM 8.5* 8.1* 9.4 9.7 9.5 9.3  CREATININE 1.49* 1.24  1.53* 1.77* 1.63* 1.74*  GFRNONAA 50* >60  --   --  45* 41*    LIVER FUNCTION TESTS: Recent Labs    07/30/21 1255 03/22/22 0850 03/23/22 2245  BILITOT 0.6 0.7 0.8  AST 41* 46* 52*  ALT 30 36 44  ALKPHOS 66 62 56  PROT 5.9* 6.4 6.8  ALBUMIN 4.0 4.3 4.1    TUMOR MARKERS: No results for input(s): "AFPTM", "CEA", "CA199", "CHROMGRNA" in the last 8760 hours.  Assessment and Plan:  Mustapha Saladin is a 72 yo male being seen today in relation to chronic kidney disease and unspecified proteinuria. Patient has suspected IgA nephropathy, but needs confirmation via biopsy prior to starting medication treatment. Case has been reviewed with Dr Denna Haggard and is set  to proceed with image-guided renal biopsy with moderate sedation on 07/04/22.  Risks and benefits of image-guided renal biopsy was discussed with the patient and/or patient's family including, but not limited to bleeding, infection, damage to adjacent structures or low yield requiring additional tests.  All of the questions were answered and there is agreement to proceed.  Consent signed and in chart.   Thank you for this interesting consult.  I greatly enjoyed meeting JAVARRI DENSFORD and look forward to participating in their care.  A copy of this report was sent to the requesting provider on this date.  Electronically Signed: Lura Em, PA-C 07/04/2022, 8:26 AM   I spent a total of  30 Minutes   in face to face in clinical consultation, greater than 50% of which was counseling/coordinating care for chronic kidney disease with unspecified proteinuria.

## 2022-07-04 NOTE — Discharge Instructions (Signed)
Resume Plavix tomorrow AM 07/05/2022.

## 2022-07-04 NOTE — Progress Notes (Signed)
Patient clinically stable post Renal biopsy per Dr Denna Haggard, tolerated well. Vitals stable pre and post procedure. Received Versed 1.5 mg along with Fentanyl 75 mcg IV for procedure. Report given to Fransico Michael RN post procedure/331.

## 2022-07-05 ENCOUNTER — Telehealth: Payer: Self-pay | Admitting: Pharmacist

## 2022-07-05 ENCOUNTER — Ambulatory Visit: Payer: Medicare Other | Admitting: Pharmacist

## 2022-07-05 DIAGNOSIS — E039 Hypothyroidism, unspecified: Secondary | ICD-10-CM

## 2022-07-05 NOTE — Patient Instructions (Signed)
Visit Information  Phone number for Pharmacist: [phone]  Thank you for meeting with me to discuss your medications! Below is a summary of what we talked about during the visit:   Recommendations/Changes made from today's visit: -Scheduled lab appt for 07/14/22 for repeat thyroid labs - consulting with PCP for orders  Follow up plan: -[place] will call patient 3 months for general update -Pharmacist follow up PRN -PCP appt 04/07/23 (CPE)   Charlene Brooke, PharmD, BCACP Clinical Pharmacist [facility] [phone]

## 2022-07-05 NOTE — Telephone Encounter (Signed)
Noted labs ordered

## 2022-07-05 NOTE — Telephone Encounter (Signed)
Levothyroxine dose was increased in December (TSH 5.49, T4 0.56) and pt was advised to return in 4-6 weeks for repeat thyroid labs, he has not done so yet.  Scheduled lab appt for 07/14/22 @ 8:45 am. Thyroid lab orders needed - routing to PCP.

## 2022-07-05 NOTE — Progress Notes (Signed)
Care Management & Coordination Services Pharmacy Note  07/05/2022 Name:  Shawn Lam MRN:  [id number] DOB:  01-11-1951  Summary: F/U visit -Anxiety: pt reports mood is stable with new medication regimen -Thyroid: Levothyroxine dose was increased in December (TSH 5.49, T4 0.56) and pt was advised to return in 4-6 weeks for repeat thyroid labs, he has not done so yet   Recommendations/Changes made from today's visit: -Scheduled lab appt for 07/14/22 for repeat thyroid labs - consulting with PCP for orders  Follow up plan: -[facility] will call patient 3 months for general update -Pharmacist follow up PRN -PCP appt 04/07/23 (CPE)    Subjective: Shawn Lam is an 72 y.o. year old male who is a primary patient of Bedsole, Amy E, MD.  The care coordination team was consulted for assistance with disease management and care coordination needs.    Engaged with patient by telephone for follow up visit.  Recent office visits: 04/01/22 Dr Diona Browner OV: ED f/u - suture removal  03/29/22 Dr Diona Browner OV: annual - increase Levothyroxine to 88 mcg. RTC 4- 6 wks for TFT.  03/08/22 Dr Diona Browner OV: cervicogenic headache - increase bupropion to 300 mg.  01/27/22 Dr Diona Browner OV: f/u - d/c buspar, mirtazapine - trial duloxetine + bupropion. Wean off pantoprazole.  12/28/21 Dr Diona Browner OV: f/u - increase duloxetine to 90 mg (3 cap) daily. D/c tramadol.  Recent consult visits: 06/14/22 Dr Holley Raring (Nephrology): CKD - plan for renal biopsy, possible [place]. No ARB d/t orthostasis.  06/14/22 Dr Rockey Situ (Cardiology): f/u CAD - d/c furosemide.  05/19/22 Dr Manuella Ghazi (Neurology): daily headache, orthostasis. Rec massage therpay, TENS unit. Start primidone 100 mg for tremor.  05/04/22 Dr Wanita Chamberlain (Pulm): continue [place]. Repeat HST 6 months before next appt.  12/10/21 PA Dunn (Cardiology): orthostatic syncope - no episodes in last 12 months.  Hospital visits: 04/25/22 ED visit [facility]): fall - rib fracture. Slipped  in shower. Rx oxycodone, lidocaine 03/23/22 ED visit [facility]): fall - dizziness. Scalp laceration. Reassuring workup.    Objective:  Lab Results  Component Value Date   CREATININE 1.68 (H) 07/04/2022   BUN 26 (H) 07/04/2022   GFR 38.10 (L) 03/22/2022   GFRNONAA 43 (L) 07/04/2022   GFRAA 60 (L) 01/08/2020   NA 138 07/04/2022   K 3.9 07/04/2022   CALCIUM 9.5 07/04/2022   CO2 25 07/04/2022   GLUCOSE 105 (H) 07/04/2022    Lab Results  Component Value Date/Time   HGBA1C 5.1 08/11/2016 08:59 AM   HGBA1C 5.5 02/02/2016 07:52 AM   GFR 38.10 (L) 03/22/2022 08:50 AM   GFR 45.43 (L) 01/20/2022 03:02 PM   MICROALBUR 0.5 06/18/2008 10:37 AM    Last diabetic Eye exam: No results found for: "HMDIABEYEEXA"  Last diabetic Foot exam: No results found for: "HMDIABFOOTEX"   Lab Results  Component Value Date   CHOL 153 03/22/2022   HDL 72.40 03/22/2022   LDLCALC 66 03/22/2022   LDLDIRECT 57 06/22/2009   TRIG 71.0 03/22/2022   CHOLHDL 2 03/22/2022       Latest Ref Rng & Units 03/23/2022   10:45 PM 03/22/2022    8:50 AM 07/30/2021   12:55 PM  Hepatic Function  Total Protein 6.5 - 8.1 g/dL 6.8  6.4  5.9   Albumin 3.5 - 5.0 g/dL 4.1  4.3  4.0   AST 15 - 41 U/L 52  46  41   ALT 0 - 44 U/L 44  36  30   Alk Phosphatase  38 - 126 U/L 56  62  66   Total Bilirubin 0.3 - 1.2 mg/dL 0.8  0.7  0.6     Lab Results  Component Value Date/Time   TSH 5.49 03/22/2022 08:50 AM   TSH 1.32 03/19/2021 10:15 AM   FREET4 0.56 (L) 03/22/2022 08:50 AM   FREET4 0.83 03/19/2021 10:15 AM       Latest Ref Rng & Units 07/04/2022    7:58 AM 04/25/2022    2:25 PM 03/23/2022   10:45 PM  CBC  WBC 4.0 - 10.5 K/uL 9.0  8.8  6.0   Hemoglobin 13.0 - 17.0 g/dL 13.3  14.0  12.9   Hematocrit 39.0 - 52.0 % 41.3  45.0  41.3   Platelets 150 - 400 K/uL 221  230  215     Lab Results  Component Value Date/Time   VD25OH 38.45 02/13/2018 02:12 PM   VITAMINB12 433 11/29/2018 02:58 PM   VITAMINB12 >1500 (H) 03/11/2016  08:35 AM    Clinical ASCVD: Yes  The 10-year ASCVD risk score (Arnett DK, et al., 2019) is: 21.8%   Values used to calculate the score:     Age: 49 years     Sex: Male     Is Non-Hispanic African American: No     Diabetic: No     Tobacco smoker: No     Systolic Blood Pressure: [id number] mmHg     Is BP treated: Yes     HDL Cholesterol: 72.4 mg/dL     Total Cholesterol: 153 mg/dL        03/29/2022    8:52 AM 03/08/2022    4:38 PM 07/30/2021   11:37 AM  Depression screen PHQ 2/9  Decreased Interest 0 3 0  Down, Depressed, Hopeless 0 2 0  PHQ - 2 Score 0 5 0  Altered sleeping 0 0 0  Tired, decreased energy 0 1 0  Change in appetite 0 1 0  Feeling bad or failure about yourself  0 1 0  Trouble concentrating 0 1 0  Moving slowly or fidgety/restless 0 3 0  Suicidal thoughts 0 0 0  PHQ-9 Score 0 12 0  Difficult doing work/chores   Not difficult at all     Social History   Tobacco Use  Smoking Status Never  Smokeless Tobacco Never   BP Readings from Last 3 Encounters:  07/04/22 (!) 142/85  06/14/22 118/68  04/25/22 115/63   Pulse Readings from Last 3 Encounters:  07/04/22 66  06/14/22 68  04/25/22 95   Wt Readings from Last 3 Encounters:  07/04/22 150 lb (68 kg)  06/14/22 148 lb 2 oz (67.2 kg)  04/25/22 148 lb 5.9 oz (67.3 kg)   BMI Readings from Last 3 Encounters:  07/04/22 20.34 kg/m  06/14/22 20.09 kg/m  04/25/22 21.91 kg/m    Allergies  Allergen Reactions   Codeine Nausea Only and Nausea And Vomiting   Tape Other (See Comments)    Use only paper tape. Severe blistering and bruising with other tapes. No cloth tape.    Medications Reviewed Today     Reviewed by Charlton Haws, [facility] (Pharmacist) on 07/05/22 at 1153  Med List Status: <None>   Medication Order Taking? Sig Documenting Provider Last Dose Status Informant  aspirin EC 81 MG tablet [id number] Yes Take 81 mg by mouth daily. [provider] Taking Active   atorvastatin (LIPITOR) 80  MG tablet [id number] Yes Take 1 tablet (80 mg total) by mouth  daily. Jinny Sanders, MD Taking Active   buPROPion (WELLBUTRIN XL) 150 MG 24 hr tablet [id number] Yes Take 2 tablets (300 mg total) by mouth daily. Jinny Sanders, MD Taking Active   busPIRone (BUSPAR) 10 MG tablet [id number] Yes Take 1 tablet (10 mg total) by mouth 3 (three) times daily. Jinny Sanders, MD Taking Active   clopidogrel (PLAVIX) 75 MG tablet [id number] Yes Take 1 tablet (75 mg total) by mouth daily. Minna Merritts, MD Taking Active   cyanocobalamin (VITAMIN B12) 1000 MCG tablet [id number] Yes Take 1,000 mcg by mouth daily. [provider] Taking Active   denosumab (PROLIA) 60 MG/ML SOSY injection [id number] Yes Inject 60 mg into the skin every 6 (six) months. Do not send RX, order this from the office [provider] Taking Active Self  diclofenac (VOLTAREN) 75 MG EC tablet [id number] Yes Take 75 mg by mouth 2 (two) times daily. [provider] Taking Active   DULoxetine (CYMBALTA) 30 MG capsule [id number] Yes TAKE THREE CAPSULES BY MOUTH DAILY Diona Browner, Amy E, MD Taking Active   Elastic Bandages & Supports (ABDOMINAL BINDER/ELASTIC LARGE) MISC [id number] Yes 1 Units by Does not apply route daily. Rise Mu, PA-C Taking Active   ferrous sulfate 325 (65 FE) MG tablet [id number] Yes TAKE 1 TABLET BY MOUTH 3 TIMES DAILY WITH MEALS. Jinny Sanders, MD Taking Active   finasteride (PROSCAR) 5 MG tablet [id number] Yes Take 5 mg by mouth daily. [provider] Taking Active Self  gabapentin (NEURONTIN) 100 MG capsule [id number] Yes TAKE 2 CAPSULES BY MOUTH TWICE A DAY Bedsole, Amy E, MD Taking Active   levothyroxine (SYNTHROID) 88 MCG tablet [id number] Yes Take 1 tablet (88 mcg total) by mouth daily before breakfast. Diona Browner, Amy E, MD Taking Active   midodrine (PROAMATINE) 10 MG tablet [id number] Yes Take 1 tablet (10 mg total) by mouth 3 (three) times daily. Minna Merritts, MD Taking Active   Multiple  Vitamins-Minerals (MULTIVITAMIN WITH MINERALS) tablet [id number] Yes Take 1 tablet by mouth daily. [provider] Taking Active Self  nitroGLYCERIN (NITROSTAT) 0.4 MG SL tablet [id number] Yes Place 1 tablet (0.4 mg total) under the tongue every 5 (five) minutes as needed for chest pain. Jinny Sanders, MD Taking Active Self  primidone (MYSOLINE) 50 MG tablet [id number] Yes Take 50 mg by mouth 2 (two) times daily. [provider] Taking Active   sucralfate (CARAFATE) 1 g tablet [id number] Yes TAKE 1 TABLET BY MOUTH 4 TIMES A DAY WITH MEALS AND AT BEDTIME Bedsole, Amy E, MD Taking Active   tamsulosin (FLOMAX) 0.4 MG CAPS capsule [id number] Yes TAKE TWO CAPSULES BY MOUTH DAILY Diona Browner, Amy E, MD Taking Active   Teriparatide, Recombinant, (FORTEO) 600 MCG/2.4ML SOPN [id number] Yes Inject into the skin every 6 (six) months. [provider] Taking Active   traMADol (ULTRAM) 50 MG tablet [id number] Yes Take 50 mg by mouth at bedtime. [provider] Taking Active   triamcinolone cream (KENALOG) 0.1 % [id number] Yes Apply 1 Application topically 2 (two) times daily as needed (irritation). Avoid face, groin, underarms. Jinny Sanders, MD Taking Active   zolpidem (AMBIEN) 10 MG tablet [id number] Yes TAKE 1 TABLET BY MOUTH EVERY NIGHT AT BEDTIME AS NEEDED Bedsole, Amy E, MD Taking Active             SDOH:  (Social Determinants of Health) assessments and interventions performed: No SDOH Interventions    Flowsheet Row Clinical Support from 03/29/2022 in [place]  [place] [facility] Visit from 03/08/2022 in [place] at [place] from 02/25/2020 in [place] [facility] [place] from 02/21/2019 in [place] at [place] Interventions Intervention Not Indicated -- -- --  Housing Interventions Intervention Not Indicated  -- -- --  Transportation Interventions Intervention Not Indicated -- -- --  Utilities Interventions Intervention Not Indicated -- -- --  Alcohol Usage Interventions Intervention Not Indicated (Score <7) -- -- --  Depression Interventions/Treatment  -- Medication, Currently on Treatment PHQ2-9 Score <4 Follow-up Not Indicated PHQ2-9 Score <4 Follow-up Not Indicated  Financial Strain Interventions Intervention Not Indicated -- -- --  Physical Activity Interventions Intervention Not Indicated -- -- --  Stress Interventions Intervention Not Indicated -- -- --  Social Connections Interventions Intervention Not Indicated -- -- --       Medication Assistance: None required.  Patient affirms current coverage meets needs.  Medication Access: Within the past 30 days, how often has patient missed a dose of medication? 0 Is a pillbox or other method used to improve adherence? Yes  Factors that may affect medication adherence? no barriers identified Are meds synced by current pharmacy? No  Are meds delivered by current pharmacy? No  Does patient experience delays in picking up medications due to transportation concerns? No   [facility] Services Reviewed: Is patient disadvantaged to use [facility]?: Yes  Current Rx insurance plan: [place] Name and location of Current pharmacy:  [facility] [id number] - [address], [address] [place] [address] Phone: [phone] Fax: [phone]  [facility] services reviewed with patient today?: No  Patient requests to transfer care to [facility]?: No  Reason patient declined to change pharmacies: Disadvantaged due to insurance/mail order  Compliance/Adherence/Medication fill history: Care Gaps: None  Star-Rating Drugs: Atorvastatin- PDC 69% - inaccurate; LF 06/14/22 x 90 ds    ASSESSMENT / PLAN  Orthostatic hypotension (BP goal 100/60-130/80) -Controlled - per pt BP is still "up and  down", he has not had any more falls since January -OSA with Inspire device -Current treatment: Midodrine 10 mg TID - Appropriate, Effective, Safe, Query Accessible -Educated on Importance of home blood pressure monitoring; -Counseled to monitor BP at home periodically -Reviewed insurance formulary - midodrine 10 mg is Tier 2 but midodrine 5 mg is Tier 2 -Recommend to continue current medication  Hyperlipidemia: (LDL goal < 70) -Controlled - LDL 66 (03/2022) at goal; pt affirms adherence -Hx CAD w/ stents -Current treatment: Atorvastatin 80 mg daily - Appropriate, Effective, Safe, Accessible Clopidogrel 75 mg daily  -Appropriate, Effective, Safe, Accessible Nitroglycerin 0.4 mg SL prn - Appropriate, Effective, Safe, Accessible -Educated on Cholesterol goals; Benefits of statin for ASCVD risk reduction; -Recommended to continue current medication  Depression/Anxiety/Insomnia (Goal: manage symptoms) -Controlled - per patient report -PHQ9: 0 (03/2022) -GAD7: 17 (02/2022) -Follows with PCP for mental health support -Current treatment: Bupropion XL 150 mg - 2 tab daily - Appropriate, Effective, Safe, Accessible Duloxetine 30 mg - 3 cap daily -Appropriate, Effective, Safe, Accessible Buspirone 10 mg TID -Appropriate, Effective, Safe, Accessible Zolpidem 10 mg daily PRN - Appropriate, Effective, Safe, Accessible -Medications previously tried/failed: alprazolam, aripiprazole, doxepin, escitalopram, fluoxetine, lithium, lamotrigine, hydroxyzine, sertraline, venlafaxine -Educated on Benefits of medication for symptom control -Recommended to continue current medication  Osteoporosis (Goal prevent fractures) -Controlled - bone density improved since last  DEXA -Last DEXA Scan: 04/02/20 > 04/13/22  T-Score femoral neck: -2.8 > -2.2  T-Score total hip: -2.2 > -2.0  T-Score lumbar spine: -0.1  T-Score forearm radius: -0.2 > +0.5 -Patient is a candidate for pharmacologic treatment due to T-Score  < -2.5 in femoral neck -Current treatment  Prolia (given 06/08/20, 12/08/20, 07/14/21, 01/27/22) - Appropriate, Effective, Safe, Accessible -Medications previously tried: Forteo, alendronate  -Recommend weight-bearing and muscle strengthening exercises for building and maintaining bone density. -Recommend to continue current medication; repeat DEXA due 03/2022  Chronic Kidney Disease Stage 3b  -All medications assessed for renal dosing and appropriateness in chronic kidney disease. -Follows with nephrology, pt has IgA nephropathy and undergoing kidney biopsy. No ARB d/t orthostasis  Hypothyroidism (Goal TSH: 0.4-4.5 -Query Controlled - TSH 5.49 (03/2022) and dose was increased, pt was advised to follow up in 4-6 weeks for repeat TFT and has not done so -Current treatment: Levothyroxine 88 mcg daily - Appropriate, Query Effective -Recommended to continue current medication; scheduled lab appt for TSH  Health Maintenance -Vaccine gaps: none -Hx BPH: on finasteride. Follows with urology (Dr Tammi Klippel) -GERD: hx duodenal ulcer, now off PPI. Continues on sucralfate.  Charlene Brooke, PharmD, [place], CPP Clinical Pharmacist Practitioner [place] [facility] [phone]

## 2022-07-11 ENCOUNTER — Other Ambulatory Visit: Payer: Self-pay | Admitting: Family Medicine

## 2022-07-11 NOTE — Telephone Encounter (Signed)
Last office visit 04/01/22 for Staple removal.  Last refilled 05/09/2022 for #120 with 1 refill.  Next Appt: CPE 04/07/2023.

## 2022-07-14 ENCOUNTER — Encounter: Payer: Self-pay | Admitting: Family Medicine

## 2022-07-14 ENCOUNTER — Other Ambulatory Visit (INDEPENDENT_AMBULATORY_CARE_PROVIDER_SITE_OTHER): Payer: Medicare Other

## 2022-07-14 ENCOUNTER — Ambulatory Visit (INDEPENDENT_AMBULATORY_CARE_PROVIDER_SITE_OTHER): Payer: Medicare Other | Admitting: Family Medicine

## 2022-07-14 VITALS — BP 128/74 | HR 71 | Temp 97.8°F | Ht 72.0 in | Wt 146.0 lb

## 2022-07-14 DIAGNOSIS — G8929 Other chronic pain: Secondary | ICD-10-CM | POA: Diagnosis not present

## 2022-07-14 DIAGNOSIS — M4722 Other spondylosis with radiculopathy, cervical region: Secondary | ICD-10-CM | POA: Diagnosis not present

## 2022-07-14 DIAGNOSIS — E039 Hypothyroidism, unspecified: Secondary | ICD-10-CM

## 2022-07-14 DIAGNOSIS — M542 Cervicalgia: Secondary | ICD-10-CM

## 2022-07-14 LAB — TSH: TSH: 0.29 u[IU]/mL — ABNORMAL LOW (ref 0.35–5.50)

## 2022-07-14 LAB — T3, FREE: T3, Free: 2.5 pg/mL (ref 2.3–4.2)

## 2022-07-14 LAB — T4, FREE: Free T4: 0.92 ng/dL (ref 0.60–1.60)

## 2022-07-14 NOTE — Assessment & Plan Note (Signed)
Chronic, symptoms are most likely related to cervical spine osteoarthritis as seen on CT neck. NSAIDs are contraindicated given he is on anticoagulants. He states he has had 0 improvement with muscle relaxants, physical therapy and current dose of Neurontin which he is on for peripheral neuropathy. Pain medication given by orthopedic doctor, unclear type, was also unhelpful.  Discussed possibly returning to orthopedic back specialist for epidural steroid injection versus facet joint injection. Now he will return to wearing cervical collar and we will try a trial of titration up of Neurontin as well as limited side effects.  Increase gabapentin to 3 times daily 100 mg. If after 1 week if still issues.. increase to 2 tablet at bedtime, then if pain is continuing increase to 2 tablets in AM as well.

## 2022-07-14 NOTE — Patient Instructions (Addendum)
Increase gabapentin to 3 times daily 100 mg. If after 1 week if still issues.. increase to 2 tablet at bedtime, then if pain is continuing increase to 2 tablets in AM as well.  Use cervical collar  when watching TV and try to sleep with it.

## 2022-07-14 NOTE — Progress Notes (Signed)
Patient ID: Shawn Lam, male    DOB: 10/07/1950, 72 y.o.   MRN: [id number]  This visit was conducted in person.  BP 128/74 (BP Location: Right Arm, Patient Position: Sitting)   Pulse 71   Temp 97.8 F (36.6 C) (Temporal)   Ht 6' (1.829 m)   Wt 146 lb (66.2 kg)   SpO2 98%   BMI 19.80 kg/m    CC:  Chief Complaint  Patient presents with   Neck Pain    Ongoing issue    Subjective:   HPI: Shawn Lam is a 72 y.o. male presenting on 07/14/2022 for Neck Pain (Ongoing issue)  History of chronic neck pain and cervicogenic headache.  Reviewed last office visits from Dr. Brigitte Pulse 05/19/2022 who performed an occipital nerve block...  helped some with headache.   At last office visit recommended Tylenol as needed, massage therapy and TENS unit.  MRI Brain wo contrast 01/07/2022 No evidence of acute intracranial abnormality.  Mild chronic small ischemic changes within the cerebral white  matter.  Left sphenoid sinusitis.   Patient also fell in shower in January and had an emergency room visit April 25, 2022.  CT cervical spine without contrast reviewed. Reviewed last cervical spineCT:  Cervical spondylosis with multilevel disc space narrowing, disc bulges, endplate spurring and uncovertebral hypertrophy. Disc space narrowing is greatest at C5-C6 and C6-C7 (advanced at these levels). No appreciable high-grade spinal canal stenosis. Multilevel bony neural foraminal narrowing. Degenerative changes are also present about the C1-C2 articulation.   Pain is mainly on left side on posterior   No radiation of symptoms into left arm. No new numbness and weakness.  Tried cervical collar.. x 1 month no relief. Completed PT.. did not help.  Uncle relaxant at night did not help.    On gabapentin for peripheral neuropathy 100 mg twice daily  Relevant past medical, surgical, family and social history reviewed and updated as indicated. Interim medical history since our last visit  reviewed. Allergies and medications reviewed and updated. Outpatient Medications Prior to Visit  Medication Sig Dispense Refill   aspirin EC 81 MG tablet Take 81 mg by mouth daily.     atorvastatin (LIPITOR) 80 MG tablet Take 1 tablet (80 mg total) by mouth daily. 90 tablet 3   buPROPion (WELLBUTRIN XL) 150 MG 24 hr tablet Take 2 tablets (300 mg total) by mouth daily. 60 tablet 5   busPIRone (BUSPAR) 10 MG tablet Take 1 tablet (10 mg total) by mouth 3 (three) times daily. 270 tablet 1   clopidogrel (PLAVIX) 75 MG tablet Take 1 tablet (75 mg total) by mouth daily. 90 tablet 3   cyanocobalamin (VITAMIN B12) 1000 MCG tablet Take 1,000 mcg by mouth daily.     denosumab (PROLIA) 60 MG/ML SOSY injection Inject 60 mg into the skin every 6 (six) months. Do not send RX, order this from the office     diclofenac (VOLTAREN) 75 MG EC tablet Take 75 mg by mouth 2 (two) times daily.     DULoxetine (CYMBALTA) 30 MG capsule TAKE THREE CAPSULES BY MOUTH DAILY 90 capsule 5   Elastic Bandages & Supports (ABDOMINAL BINDER/ELASTIC LARGE) MISC 1 Units by Does not apply route daily. 1 each 1   ferrous sulfate 325 (65 FE) MG tablet TAKE 1 TABLET BY MOUTH 3 TIMES DAILY WITH MEALS. 270 tablet 3   finasteride (PROSCAR) 5 MG tablet Take 5 mg by mouth daily.     gabapentin (NEURONTIN) 100 MG  capsule TAKE 2 CAPSULES BY MOUTH TWICE A DAY 120 capsule 1   levothyroxine (SYNTHROID) 88 MCG tablet Take 1 tablet (88 mcg total) by mouth daily before breakfast. 30 tablet 11   midodrine (PROAMATINE) 10 MG tablet Take 1 tablet (10 mg total) by mouth 3 (three) times daily. 270 tablet 3   Multiple Vitamins-Minerals (MULTIVITAMIN WITH MINERALS) tablet Take 1 tablet by mouth daily.     nitroGLYCERIN (NITROSTAT) 0.4 MG SL tablet Place 1 tablet (0.4 mg total) under the tongue every 5 (five) minutes as needed for chest pain. 10 tablet 1   primidone (MYSOLINE) 50 MG tablet Take 50 mg by mouth 2 (two) times daily.     sucralfate (CARAFATE) 1 g  tablet TAKE 1 TABLET BY MOUTH 4 TIMES A DAY WITH MEALS AND AT BEDTIME 120 tablet 2   Teriparatide, Recombinant, (FORTEO) 600 MCG/2.4ML SOPN Inject into the skin every 6 (six) months.     traMADol (ULTRAM) 50 MG tablet Take 50 mg by mouth at bedtime.     triamcinolone cream (KENALOG) 0.1 % Apply 1 Application topically 2 (two) times daily as needed (irritation). Avoid face, groin, underarms. 454 g 0   zolpidem (AMBIEN) 10 MG tablet TAKE 1 TABLET BY MOUTH EVERY NIGHT AT BEDTIME AS NEEDED 30 tablet 0   tamsulosin (FLOMAX) 0.4 MG CAPS capsule TAKE TWO CAPSULES BY MOUTH DAILY 180 capsule 3   No facility-administered medications prior to visit.     Per HPI unless specifically indicated in ROS section below Review of Systems  Constitutional:  Negative for fatigue and fever.  HENT:  Negative for ear pain.   Eyes:  Negative for pain.  Respiratory:  Negative for cough and shortness of breath.   Cardiovascular:  Negative for chest pain, palpitations and leg swelling.  Gastrointestinal:  Negative for abdominal pain.  Genitourinary:  Negative for dysuria.  Musculoskeletal:  Negative for arthralgias.  Neurological:  Negative for syncope, light-headedness and headaches.  Psychiatric/Behavioral:  Negative for dysphoric mood.    Objective:  BP 128/74 (BP Location: Right Arm, Patient Position: Sitting)   Pulse 71   Temp 97.8 F (36.6 C) (Temporal)   Ht 6' (1.829 m)   Wt 146 lb (66.2 kg)   SpO2 98%   BMI 19.80 kg/m   Wt Readings from Last 3 Encounters:  07/14/22 146 lb (66.2 kg)  07/04/22 150 lb (68 kg)  06/14/22 148 lb 2 oz (67.2 kg)      Physical Exam Constitutional:      Appearance: He is well-developed.  HENT:     Head: Normocephalic.     Right Ear: Hearing normal.     Left Ear: Hearing normal.     Nose: Nose normal.  Neck:     Thyroid: No thyroid mass or thyromegaly.     Vascular: No carotid bruit.     Trachea: Trachea normal.  Cardiovascular:     Rate and Rhythm: Normal rate  and regular rhythm.     Pulses: Normal pulses.     Heart sounds: Heart sounds not distant. No murmur heard.    No friction rub. No gallop.     Comments: No peripheral edema Pulmonary:     Effort: Pulmonary effort is normal. No respiratory distress.     Breath sounds: Normal breath sounds.  Musculoskeletal:     Cervical back: Rigidity and tenderness present. No bony tenderness. Pain with movement present. Decreased range of motion.     Comments: Tender to palpation over left  paraspinous muscle cervical spine.  Skin:    General: Skin is warm and dry.     Findings: No rash.  Psychiatric:        Speech: Speech normal.        Behavior: Behavior normal.        Thought Content: Thought content normal.       Results for orders placed or performed in visit on 07/14/22  T4, free  Result Value Ref Range   Free T4 0.92 0.60 - 1.60 ng/dL  T3, free  Result Value Ref Range   T3, Free 2.5 2.3 - 4.2 pg/mL  TSH  Result Value Ref Range   TSH 0.29 (L) 0.35 - 5.50 uIU/mL    Assessment and Plan  Chronic neck pain Assessment & Plan: Chronic, symptoms are most likely related to cervical spine osteoarthritis as seen on CT neck. NSAIDs are contraindicated given he is on anticoagulants. He states he has had 0 improvement with muscle relaxants, physical therapy and current dose of Neurontin which he is on for peripheral neuropathy. Pain medication given by orthopedic doctor, unclear type, was also unhelpful.  Discussed possibly returning to orthopedic back specialist for epidural steroid injection versus facet joint injection. Now he will return to wearing cervical collar and we will try a trial of titration up of Neurontin as well as limited side effects.  Increase gabapentin to 3 times daily 100 mg. If after 1 week if still issues.. increase to 2 tablet at bedtime, then if pain is continuing increase to 2 tablets in AM as well.     Osteoarthritis of spine with radiculopathy, cervical  region    No follow-ups on file.   [name], MD

## 2022-07-27 ENCOUNTER — Other Ambulatory Visit: Payer: Self-pay | Admitting: Family Medicine

## 2022-07-27 NOTE — Telephone Encounter (Signed)
Last office visit 07/14/22 for neck pain.  Last refilled 06/28/2022 for #30 with no refills.  Next Appt: CPE 04/07/2023.

## 2022-08-01 ENCOUNTER — Telehealth: Payer: Self-pay

## 2022-08-01 ENCOUNTER — Encounter: Payer: Self-pay | Admitting: Family Medicine

## 2022-08-01 NOTE — Telephone Encounter (Signed)
New encounter created for Prolia BIV 

## 2022-08-01 NOTE — Telephone Encounter (Signed)
Prolia VOB initiated via [email or web address] 

## 2022-08-01 NOTE — Telephone Encounter (Signed)
Last office visit 07/14/2022 for chronic neck pain.  Last refilled 07/12/2022 for #120 with 1 refill.  Patient is now taking 12 capsules daily.

## 2022-08-01 NOTE — Telephone Encounter (Signed)
Please start new PA for Prolia.  Patient's last inj was on 01/27/2022.  Please respond to Barnie Mort at [facility].  I am just filling in for the week.  Thank you!

## 2022-08-02 MED ORDER — GABAPENTIN 100 MG PO CAPS: ORAL_CAPSULE | ORAL | 0 refills | Status: DC

## 2022-08-02 NOTE — Telephone Encounter (Signed)
Patient called in to check on the status of gabapentin being sent in for him?

## 2022-08-02 NOTE — Telephone Encounter (Signed)
Spoke with Mr. Agena.  He would like the high dose capsule sent in so he is not having to take so many.

## 2022-08-05 NOTE — Telephone Encounter (Signed)
Pt ready for scheduling for Prolia on or after : 08/05/22  Out-of-pocket cost due at time of visit: $0  Primary: Medicare Prolia co-insurance: 0% Admin fee co-insurance: 0%  Secondary: A[employer]na medsup Prolia co-insurance:  Admin fee co-insurance:   Medical Benefit Details: Date Benefits were checked: 08/02/22 Deductible: $240 met of $240 required/ Coinsurance: 0%/ Admin Fee: 0%  Prior Auth: N/A PA# Expiration Date:    Pharmacy benefit: Copay $--- If patient wants fill through the pharmacy benefit please send prescription to:  --- , and include estimated need by date in rx notes. Pharmacy will ship medication directly to the office.  Patient NOT eligible for Prolia Copay Card. Copay Card can make patient's cost as little as $25. Link to apply: https://www.amgensupportplus.com/copay  ** This summary of benefits is an estimation of the patient's out-of-pocket cost. Exact cost may very based on individual plan coverage.

## 2022-08-16 ENCOUNTER — Encounter: Payer: Self-pay | Admitting: Nephrology

## 2022-08-16 LAB — SURGICAL PATHOLOGY

## 2022-08-17 DIAGNOSIS — F419 Anxiety disorder, unspecified: Secondary | ICD-10-CM | POA: Diagnosis not present

## 2022-08-17 DIAGNOSIS — R251 Tremor, unspecified: Secondary | ICD-10-CM | POA: Diagnosis not present

## 2022-08-17 DIAGNOSIS — R519 Headache, unspecified: Secondary | ICD-10-CM | POA: Diagnosis not present

## 2022-08-17 DIAGNOSIS — M5481 Occipital neuralgia: Secondary | ICD-10-CM | POA: Diagnosis not present

## 2022-08-17 DIAGNOSIS — R42 Dizziness and giddiness: Secondary | ICD-10-CM | POA: Diagnosis not present

## 2022-08-17 DIAGNOSIS — Z8679 Personal history of other diseases of the circulatory system: Secondary | ICD-10-CM | POA: Diagnosis not present

## 2022-08-17 DIAGNOSIS — F32A Depression, unspecified: Secondary | ICD-10-CM | POA: Diagnosis not present

## 2022-08-18 DIAGNOSIS — M5412 Radiculopathy, cervical region: Secondary | ICD-10-CM | POA: Diagnosis not present

## 2022-08-18 DIAGNOSIS — M542 Cervicalgia: Secondary | ICD-10-CM | POA: Diagnosis not present

## 2022-08-19 ENCOUNTER — Other Ambulatory Visit: Payer: Self-pay | Admitting: Student

## 2022-08-19 ENCOUNTER — Other Ambulatory Visit: Payer: Self-pay | Admitting: Orthopedic Surgery

## 2022-08-19 ENCOUNTER — Other Ambulatory Visit: Payer: Self-pay | Admitting: Family Medicine

## 2022-08-19 DIAGNOSIS — M5412 Radiculopathy, cervical region: Secondary | ICD-10-CM

## 2022-08-19 DIAGNOSIS — R42 Dizziness and giddiness: Secondary | ICD-10-CM

## 2022-08-22 ENCOUNTER — Ambulatory Visit: Payer: Medicare Other

## 2022-08-22 ENCOUNTER — Ambulatory Visit
Admission: RE | Admit: 2022-08-22 | Discharge: 2022-08-22 | Disposition: A | Discharge status: Home / Self Care | Payer: Medicare Other | Source: Ambulatory Visit | Attending: Orthopedic Surgery | Admitting: Orthopedic Surgery

## 2022-08-22 ENCOUNTER — Ambulatory Visit
Admission: RE | Admit: 2022-08-22 | Discharge: 2022-08-22 | Disposition: A | Discharge status: Home / Self Care | Payer: Medicare Other | Source: Ambulatory Visit | Attending: Student | Admitting: Student

## 2022-08-22 ENCOUNTER — Telehealth: Payer: Self-pay | Admitting: Family Medicine

## 2022-08-22 DIAGNOSIS — M5412 Radiculopathy, cervical region: Secondary | ICD-10-CM | POA: Insufficient documentation

## 2022-08-22 DIAGNOSIS — R42 Dizziness and giddiness: Secondary | ICD-10-CM

## 2022-08-22 DIAGNOSIS — I6523 Occlusion and stenosis of bilateral carotid arteries: Secondary | ICD-10-CM | POA: Diagnosis not present

## 2022-08-22 DIAGNOSIS — M542 Cervicalgia: Secondary | ICD-10-CM | POA: Diagnosis not present

## 2022-08-22 NOTE — Telephone Encounter (Signed)
Prescription Request  08/22/2022  LOV: 07/14/2022  What is the name of the medication or equipment? busPIRone (BUSPAR) 10 MG tablet   Have you contacted your pharmacy to request a refill? No   Which pharmacy would you like this sent to?  [facility] [id number] [address], [place] - [address] [place] [address] Phone: [phone] Fax: [phone]    Patient notified that their request is being sent to the clinical staff for review and that they should receive a response within 2 business days.   Please advise at Mobile [phone] (mobile)  Patient stated he has misunderstood directions for this medication and has been taking 2 tablets 3x daily for a total of 6 tablets a day, rather than one tablet 3x daily for a total of 3 tablets a day. States he could not remember which instructions Dr. Ermalene Searing had given him for dosage and frequency. Reminded patient of instructions under medications section in chart. States he is out of this medication and is requesting a refill.

## 2022-08-22 NOTE — Telephone Encounter (Signed)
Spoke with pharmacist  at [facility].  Of course insurance won't cover refill because it is too soon for refill but patient can pay out of pocket $16.44 to get early refill.  Pharmacy will get Rx ready.  [name] notified of this via telephone.  Patient states understanding. FYI to Dr. Ermalene Searing.

## 2022-08-22 NOTE — Telephone Encounter (Signed)
Left message to return call to our office.  

## 2022-08-28 ENCOUNTER — Other Ambulatory Visit: Payer: Self-pay | Admitting: Family Medicine

## 2022-08-29 ENCOUNTER — Other Ambulatory Visit: Payer: Self-pay | Admitting: Family Medicine

## 2022-08-29 NOTE — Telephone Encounter (Signed)
Last office visit 07/14/2022 for neck pain.  Last refilled 08/03/22 for #360 with no refills.  Next Appt: CPE 04/07/2023.

## 2022-08-29 NOTE — Telephone Encounter (Signed)
Last office visit 07/14/2022 for neck pain.  Last refilled 07/27/22 for #30 with no refills.  Next Appt: CPE 04/07/2023.

## 2022-08-30 ENCOUNTER — Other Ambulatory Visit: Payer: Self-pay

## 2022-08-30 DIAGNOSIS — M810 Age-related osteoporosis without current pathological fracture: Secondary | ICD-10-CM

## 2022-08-30 DIAGNOSIS — M4722 Other spondylosis with radiculopathy, cervical region: Secondary | ICD-10-CM

## 2022-08-30 NOTE — Addendum Note (Signed)
Addended by: Alvina Chou on: 08/30/2022 04:08 PM   Modules accepted: Orders

## 2022-08-30 NOTE — Telephone Encounter (Signed)
Left message to return call to our office.  

## 2022-08-30 NOTE — Telephone Encounter (Signed)
Called patient reviewed all following information including appointment, Co pay due at time of visit and if pick up of injection is needed from outside pharmacy.     Out of pocket for patient: $0   Lab appointment : 5/22  Nurse visit:  09/14/22  Lab order placed: Yes  Prolia has been  [x]   Ordered    []   Script sent to local pharmacy for patient to bring   []   Script sent to Specialty pharmacy   []   Script sent to [facility] to deliver

## 2022-08-31 ENCOUNTER — Other Ambulatory Visit (INDEPENDENT_AMBULATORY_CARE_PROVIDER_SITE_OTHER): Payer: Medicare Other

## 2022-08-31 DIAGNOSIS — M810 Age-related osteoporosis without current pathological fracture: Secondary | ICD-10-CM | POA: Diagnosis not present

## 2022-09-01 LAB — BASIC METABOLIC PANEL
BUN: 17 mg/dL (ref 6–23)
CO2: 29 mEq/L (ref 19–32)
Calcium: 9.3 mg/dL (ref 8.4–10.5)
Chloride: 105 mEq/L (ref 96–112)
Creatinine, Ser: 1.4 mg/dL (ref 0.40–1.50)
GFR: 50.32 mL/min — ABNORMAL LOW (ref >60.00)
Glucose, Bld: 98 mg/dL (ref 70–99)
Potassium: 4.8 mEq/L (ref 3.5–5.1)
Sodium: 140 mEq/L (ref 135–145)

## 2022-09-14 ENCOUNTER — Encounter: Payer: Medicare Other | Admitting: Pharmacist

## 2022-09-14 ENCOUNTER — Ambulatory Visit (INDEPENDENT_AMBULATORY_CARE_PROVIDER_SITE_OTHER): Payer: Medicare Other

## 2022-09-14 DIAGNOSIS — M810 Age-related osteoporosis without current pathological fracture: Secondary | ICD-10-CM

## 2022-09-14 MED ORDER — DENOSUMAB 60 MG/ML ~~LOC~~ SOSY
60.0000 mg | PREFILLED_SYRINGE | Freq: Once | SUBCUTANEOUS | Status: AC
  Administered 2022-09-14: 60 mg via SUBCUTANEOUS

## 2022-09-14 NOTE — Progress Notes (Signed)
Per orders of Dr. Kerby Nora, injection of Prolia 60 mg given by Lewanda Rife in right arm. Patient tolerated injection well. Patient will make appointment for 6 month.

## 2022-09-22 DIAGNOSIS — M1812 Unilateral primary osteoarthritis of first carpometacarpal joint, left hand: Secondary | ICD-10-CM | POA: Diagnosis not present

## 2022-09-22 DIAGNOSIS — M180 Bilateral primary osteoarthritis of first carpometacarpal joints: Secondary | ICD-10-CM | POA: Diagnosis not present

## 2022-09-22 DIAGNOSIS — M5030 Other cervical disc degeneration, unspecified cervical region: Secondary | ICD-10-CM | POA: Diagnosis not present

## 2022-09-22 DIAGNOSIS — M1811 Unilateral primary osteoarthritis of first carpometacarpal joint, right hand: Secondary | ICD-10-CM | POA: Diagnosis not present

## 2022-09-22 DIAGNOSIS — M47812 Spondylosis without myelopathy or radiculopathy, cervical region: Secondary | ICD-10-CM | POA: Diagnosis not present

## 2022-09-26 ENCOUNTER — Telehealth: Payer: Self-pay | Admitting: *Deleted

## 2022-09-26 ENCOUNTER — Other Ambulatory Visit: Payer: Self-pay | Admitting: Family Medicine

## 2022-09-26 NOTE — Telephone Encounter (Signed)
Morrie Sheldon from [facility] called in to follow up on this request. She stated that they are needing to know soon because he will have to stop taking it tomorrow. Please advise. Thank you!

## 2022-09-26 NOTE — Telephone Encounter (Signed)
Received fax from Dr. Yves Dill office.  He is asking for clearance for patient to hold Plavix 7 days prior to cervical medial branch blocks x 2.  He's scheduled for 10/04/22 and 10/20/22.  I ask Dr. Patsy Lager to review to see if he could sign clearance since Dr. Ermalene Searing is out of the office.  He ask that I send Dr. Ermalene Searing a phone note to make sure this is okay to do.  Dr. Patsy Lager states he is not comfortable signing because he doesn't know why patient is on Plavix.  Please advise.

## 2022-09-26 NOTE — Telephone Encounter (Signed)
Last office visit 07/14/22 for neck pain.  Last refilled 08/30/22 for #30 with no refills.  Next Appt: CPE 04/07/23.

## 2022-09-26 NOTE — Telephone Encounter (Signed)
Last office visit 07/14/22 for neck pain.  Last refilled 08/30/22 for #360 with no refills.  Next Appt: CPE 04/07/23.

## 2022-09-27 NOTE — Telephone Encounter (Signed)
Patient called in to follow up on this refill request. He stated that he will be out of the medication today. Thank you!

## 2022-09-27 NOTE — Telephone Encounter (Signed)
[facility] called in and stated that they would like a call when this is complete so they can let patient know. They would like it to be fax also. They can be reached at [phone]. Thank you!

## 2022-09-27 NOTE — Telephone Encounter (Signed)
Followed by Dr. Mariah Milling, cardiology. He has  remote placement of  2 stents  and he on plavix for this.  Have the request sent to his cardiologist please.

## 2022-09-28 ENCOUNTER — Ambulatory Visit: Payer: Medicare Other | Attending: Nurse Practitioner | Admitting: Nurse Practitioner

## 2022-09-28 ENCOUNTER — Telehealth: Payer: Self-pay | Admitting: Cardiovascular Disease

## 2022-09-28 ENCOUNTER — Telehealth: Payer: Self-pay | Admitting: *Deleted

## 2022-09-28 DIAGNOSIS — Z01810 Encounter for preprocedural cardiovascular examination: Secondary | ICD-10-CM | POA: Diagnosis not present

## 2022-09-28 NOTE — Telephone Encounter (Signed)
   Name: Shawn Lam  DOB: 1950/07/03  MRN: [id number]  Primary Cardiologist: Julien Nordmann, MD   Preoperative team, please contact this patient and set up a phone call appointment for further preoperative risk assessment. Please obtain consent and complete medication review. Thank you for your help.  I confirm that guidance regarding antiplatelet and oral anticoagulation therapy has been completed and, if necessary, noted below.  Per office protocol, if patient is without any new symptoms or concerns at the time of their virtual visit, he may hold Plavix for 5 days prior to procedure. Please resume Plavix as soon as possible postprocedure, at the discretion of the surgeon.     Joylene Grapes, NP 09/28/2022, 1:09 PM [facility]

## 2022-09-28 NOTE — Telephone Encounter (Signed)
Pt scheduled for a tele visit, today, 3:20.  Consent on file / medications reconciled.     Patient Consent for Virtual Visit        Shawn Lam has provided verbal consent on 09/28/2022 for a virtual visit (video or telephone).   CONSENT FOR VIRTUAL VISIT FOR:  Shawn Lam  By participating in this virtual visit I agree to the following:  I hereby voluntarily request, consent and authorize [facility] and its employed or contracted physicians, physician assistants, nurse practitioners or other licensed health care professionals (the Practitioner), to provide me with telemedicine health care services (the "Services") as deemed necessary by the treating Practitioner. I acknowledge and consent to receive the Services by the Practitioner via telemedicine. I understand that the telemedicine visit will involve communicating with the Practitioner through live audiovisual communication technology and the disclosure of certain medical information by electronic transmission. I acknowledge that I have been given the opportunity to request an in-person assessment or other available alternative prior to the telemedicine visit and am voluntarily participating in the telemedicine visit.  I understand that I have the right to withhold or withdraw my consent to the use of telemedicine in the course of my care at any time, without affecting my right to future care or treatment, and that the Practitioner or I may terminate the telemedicine visit at any time. I understand that I have the right to inspect all information obtained and/or recorded in the course of the telemedicine visit and may receive copies of available information for a reasonable fee.  I understand that some of the potential risks of receiving the Services via telemedicine include:  Delay or interruption in medical evaluation due to technological equipment failure or disruption; Information transmitted may not be sufficient (e.g.  poor resolution of images) to allow for appropriate medical decision making by the Practitioner; and/or  In rare instances, security protocols could fail, causing a breach of personal health information.  Furthermore, I acknowledge that it is my responsibility to provide information about my medical history, conditions and care that is complete and accurate to the best of my ability. I acknowledge that Practitioner's advice, recommendations, and/or decision may be based on factors not within their control, such as incomplete or inaccurate data provided by me or distortions of diagnostic images or specimens that may result from electronic transmissions. I understand that the practice of medicine is not an exact science and that Practitioner makes no warranties or guarantees regarding treatment outcomes. I acknowledge that a copy of this consent can be made available to me via my patient portal [name]), or I can request a printed copy by calling the office of [facility].    I understand that my insurance will be billed for this visit.   I have read or had this consent read to me. I understand the contents of this consent, which adequately explains the benefits and risks of the Services being provided via telemedicine.  I have been provided ample opportunity to ask questions regarding this consent and the Services and have had my questions answered to my satisfaction. I give my informed consent for the services to be provided through the use of telemedicine in my medical care

## 2022-09-28 NOTE — Telephone Encounter (Signed)
Pt scheduled for a tele visit, today, 3:20.  Consent on file / medications reconciled.

## 2022-09-28 NOTE — Telephone Encounter (Signed)
Mr. Foth notified by telephone that his zolpidem has been refilled.

## 2022-09-28 NOTE — Progress Notes (Signed)
Virtual Visit via Telephone Note   Because of Shawn Lam co-morbid illnesses, he is at least at moderate risk for complications without adequate follow up.  This format is felt to be most appropriate for this patient at this time.  The patient did not have access to video technology/had technical difficulties with video requiring transitioning to audio format only (telephone).  All issues noted in this document were discussed and addressed.  No physical exam could be performed with this format.  Please refer to the patient's chart for his consent to telehealth for [facility].  Evaluation Performed:  Preoperative cardiovascular risk assessment _____________   Date:  09/28/2022   Patient ID:  Shawn Lam, DOB 07/12/1950, MRN [id number] Patient Location:  Home Provider location:   Office  Primary Care Provider:  Excell Seltzer, MD Primary Cardiologist:  Julien Nordmann, MD  Chief Complaint / Patient Profile   72 y.o. y/o male with a h/o CAD s/p PCI-LAD and RCA, palpitations, orthostatic hypotension, hyperlipidemia, and OSA who is pending cervical medial branch blocks X2 10/04/2022 and 10/20/2022 with [facility] clinic physiatry and presents today for telephonic preoperative cardiovascular risk assessment.  History of Present Illness    Shawn Lam is a 72 y.o. male who presents via audio/video conferencing for a telehealth visit today.  Pt was last seen in cardiology clinic on 06/14/2022 by Dr. Mariah Milling.  At that time CAYENNE RANDLES was doing well.  The patient is now pending procedure as outlined above. Since his last visit, he has done well from a cardiac standpoint.   He denies chest pain, palpitations, dyspnea, pnd, orthopnea, n, v, dizziness, syncope, edema, weight gain, or early satiety. All other systems reviewed and are otherwise negative except as noted above.   Past Medical History    Past Medical History:  Diagnosis Date   Actinic keratosis 02/04/2021    right lateral neck   Anemia    Anxiety    Asymptomatic LV dysfunction    50-55% by echo 06/2016   Berger's disease    CAD (coronary artery disease) 06/2005   PCI of LAD and RCA   Depression    Dilated aortic root (HCC)    4cm at sinus of Valsalva   Hematuria    Hyperlipidemia    Hypothyroidism    Insomnia    Left ureteral stone    Orthostatic hypotension    negative tilt table   OSA (obstructive sleep apnea)    moderate with AHI 17/hr, uses CPAP nightly(01/15/19 - has Inspire implant)   PVC's (premature ventricular contractions) 07/07/2016   Renal disorder    PT IS SEEING DR. Eliott Nine- NEPHROLOGIST   Rosacea    Urticaria    Past Surgical History:  Procedure Laterality Date   CARDIAC CATHETERIZATION  09/06/2013   [facility]   CARDIAC CATHETERIZATION     COLONOSCOPY WITH PROPOFOL N/A 01/21/2019   Procedure: COLONOSCOPY WITH PROPOFOL;  Surgeon: Midge Minium, MD;  Location: [facility];  Service: Endoscopy;  Laterality: N/A;  sleep apnea   COLONOSCOPY WITH PROPOFOL N/A 10/19/2021   Procedure: COLONOSCOPY WITH PROPOFOL;  Surgeon: Midge Minium, MD;  Location: [facility] ENDOSCOPY;  Service: Endoscopy;  Laterality: N/A;   CORONARY ANGIOPLASTY  2004   STENT PLACEMENT   CYSTOSCOPY WITH RETROGRADE PYELOGRAM, URETEROSCOPY AND STENT PLACEMENT Left 03/19/2014   Procedure: CYSTOSCOPY WITH RETROGRADE PYELOGRAM, URETEROSCOPY AND STENT PLACEMENT;  Surgeon: Loletta Parish, MD;  Location: [facility];  Service: Urology;  Laterality: Left;  CYSTOSCOPY WITH RETROGRADE PYELOGRAM, URETEROSCOPY AND STENT PLACEMENT Bilateral 05/20/2015   Procedure:  CYSTOSCOPY WITH BILATERAL RETROGRADE PYELOGRAM,RIGHT  DIAGNOSTIC URETEROSCOPY ,LEFT URETEROSCOPY WITH HOLMIUM LASER  AND BILATERAL  STENT PLACEMENT ;  Surgeon: Sebastian Ache, MD;  Location: [facility];  Service: Urology;  Laterality: Bilateral;   DRUG INDUCED ENDOSCOPY N/A 10/20/2017   Procedure: DRUG INDUCED SLEEP ENDOSCOPY;  Surgeon: Christia Reading, MD;  Location:  [facility];  Service: ENT;  Laterality: N/A;   EP IMPLANTABLE DEVICE N/A 05/13/2016   Procedure: Loop Recorder Insertion;  Surgeon: Duke Salvia, MD;  Location: [facility];  Service: Cardiovascular;  Laterality: N/A;   ESOPHAGOGASTRODUODENOSCOPY N/A 10/19/2021   Procedure: ESOPHAGOGASTRODUODENOSCOPY (EGD);  Surgeon: Midge Minium, MD;  Location: [facility] ENDOSCOPY;  Service: Endoscopy;  Laterality: N/A;   ESOPHAGOGASTRODUODENOSCOPY (EGD) WITH PROPOFOL N/A 11/30/2018   Procedure: ESOPHAGOGASTRODUODENOSCOPY (EGD) WITH PROPOFOL;  Surgeon: Wyline Mood, MD;  Location: [facility] ENDOSCOPY;  Service: Gastroenterology;  Laterality: N/A;   ESOPHAGOGASTRODUODENOSCOPY (EGD) WITH PROPOFOL N/A 12/01/2018   Procedure: ESOPHAGOGASTRODUODENOSCOPY (EGD) WITH PROPOFOL;  Surgeon: Midge Minium, MD;  Location: [facility] ENDOSCOPY;  Service: Endoscopy;  Laterality: N/A;   HOLMIUM LASER APPLICATION Bilateral 05/20/2015   Procedure: HOLMIUM LASER APPLICATION;  Surgeon: Sebastian Ache, MD;  Location: [facility];  Service: Urology;  Laterality: Bilateral;   IMPLIMENTATION OF A HYPOGLOSSAL NERVE STIMULATOR  12/08/2017   OSA    LEFT HEART CATHETERIZATION WITH CORONARY ANGIOGRAM N/A 09/06/2013   Procedure: LEFT HEART CATHETERIZATION WITH CORONARY ANGIOGRAM;  Surgeon: Lesleigh Noe, MD;  Location: [facility] CATH LAB;  Service: Cardiovascular;  Laterality: N/A;   LEFT KNEE CAP  SURGERY ABOUT 40 YRS AGO  ? 1970     STONE EXTRACTION WITH BASKET Left 03/19/2014   Procedure: STONE EXTRACTION WITH BASKET;  Surgeon: Loletta Parish, MD;  Location: [facility];  Service: Urology;  Laterality: Left;   TRANSURETHRAL RESECTION OF PROSTATE N/A 08/25/2021   Procedure: TRANSURETHRAL RESECTION OF THE PROSTATE (TURP);  Surgeon: Sebastian Ache, MD;  Location: [facility];  Service: Urology;  Laterality: N/A;  1 HR    Allergies  Allergies  Allergen Reactions   Codeine Nausea Only and Nausea And Vomiting   Tape Other (See Comments)    Use only  paper tape. Severe blistering and bruising with other tapes. No cloth tape.    Home Medications    Prior to Admission medications   Medication Sig Start Date End Date Taking? Authorizing Provider  aspirin EC 81 MG tablet Take 81 mg by mouth daily. 10/02/18   [provider]  atorvastatin (LIPITOR) 80 MG tablet Take 1 tablet (80 mg total) by mouth daily. 05/04/22   Bedsole, Amy E, MD  buPROPion (WELLBUTRIN XL) 150 MG 24 hr tablet Take 2 tablets (300 mg total) by mouth daily. 05/24/22   Bedsole, Amy E, MD  busPIRone (BUSPAR) 10 MG tablet Take 1 tablet (10 mg total) by mouth 3 (three) times daily. 06/28/22   Bedsole, Amy E, MD  clopidogrel (PLAVIX) 75 MG tablet Take 1 tablet (75 mg total) by mouth daily. 06/14/22   Antonieta Iba, MD  cyanocobalamin (VITAMIN B12) 1000 MCG tablet Take 1,000 mcg by mouth daily. 10/02/18   [provider]  denosumab (PROLIA) 60 MG/ML SOSY injection Inject 60 mg into the skin every 6 (six) months. Do not send RX, order this from the office    [provider]  diclofenac (VOLTAREN) 75 MG EC tablet Take 75 mg by mouth 2 (  two) times daily. 11/30/17   [provider]  DULoxetine (CYMBALTA) 30 MG capsule TAKE THREE CAPSULES BY MOUTH DAILY 04/24/22   Excell Seltzer, MD  Elastic Bandages & Supports (ABDOMINAL BINDER/ELASTIC LARGE) MISC 1 Units by Does not apply route daily. 12/10/21   Dunn, Raymon Mutton, PA-C  finasteride (PROSCAR) 5 MG tablet Take 5 mg by mouth daily. 01/22/21   [provider]  gabapentin (NEURONTIN) 100 MG capsule TAKE FOUR CAPSULES BY MOUTH EVERY MORNING, 2 CAPSULES AT NOON, TAKE 2 CAPSULES MID-AFTERNOON AND TAKE FOUR CAPSULES AT NIGHT. 09/26/22   Copland, Karleen Hampshire, MD  levothyroxine (SYNTHROID) 88 MCG tablet Take 1 tablet (88 mcg total) by mouth daily before breakfast. 03/29/22   Bedsole, Amy E, MD  midodrine (PROAMATINE) 10 MG tablet Take 1 tablet (10 mg total) by mouth 3 (three) times daily. 06/14/22   Antonieta Iba, MD   Multiple Vitamins-Minerals (MULTIVITAMIN WITH MINERALS) tablet Take 1 tablet by mouth daily.    [provider]  nitroGLYCERIN (NITROSTAT) 0.4 MG SL tablet Place 1 tablet (0.4 mg total) under the tongue every 5 (five) minutes as needed for chest pain. 03/26/21   Bedsole, Amy E, MD  primidone (MYSOLINE) 50 MG tablet Take 50 mg by mouth 2 (two) times daily. 05/31/22   [provider]  sucralfate (CARAFATE) 1 g tablet TAKE 1 TABLET BY MOUTH 4 TIMES A DAY WITH MEALS AND AT BEDTIME 08/19/22   Bedsole, Amy E, MD  traMADol (ULTRAM) 50 MG tablet Take 50 mg by mouth at bedtime. 10/02/18   [provider]  triamcinolone cream (KENALOG) 0.1 % Apply 1 Application topically 2 (two) times daily as needed (irritation). Avoid face, groin, underarms. 06/02/22   Bedsole, Amy E, MD  zolpidem (AMBIEN) 10 MG tablet TAKE 1 TABLET BY MOUTH EVERY NIGHT AT BEDTIME AS NEEDED 09/28/22   Excell Seltzer, MD    Physical Exam    Vital Signs:  SOTERO TORCIVIA does not have vital signs available for review today.  Given telephonic nature of communication, physical exam is limited. AAOx3. NAD. Normal affect.  Speech and respirations are unlabored.  Accessory Clinical Findings    None  Assessment & Plan    1.  Preoperative Cardiovascular Risk Assessment:  According to the Revised Cardiac Risk Index (RCRI), his Perioperative Risk of Major Cardiac Event is (%): 0.9. His Functional Capacity in METs is: 8.42 according to the Duke Activity Status Index (DASI). Therefore, based on ACC/AHA guidelines, patient would be at acceptable risk for the planned procedure without further cardiovascular testing.   The patient was advised that if he develops new symptoms prior to surgery to contact our office to arrange for a follow-up visit, and he verbalized understanding.  Per office protocol, he may hold Plavix for 5-7 days prior to each procedure. Please resume Plavix as soon as possible postprocedure, at the  discretion of the surgeon.   A copy of this note will be routed to requesting surgeon.  Time:   Today, I have spent 5 minutes with the patient with telehealth technology discussing medical history, symptoms, and management plan.     Joylene Grapes, NP  09/28/2022, 3:25 PM

## 2022-09-28 NOTE — Telephone Encounter (Signed)
   Pre-operative Risk Assessment    Patient Name: Shawn Lam  DOB: 04-10-1951 MRN: [id number]      Request for Surgical Clearance    Procedure:   cervical medial branch blocks X2  Date of Surgery:  Clearance 10/04/22   and 10/20/22                               Surgeon:  not indicated Surgeon's Group or Practice Name:  [facility] clinic Physiatry Phone number:  [phone] Fax number:  [phone]   Type of Clearance Requested:   - Pharmacy:  Hold Clopidogrel (Plavix) hold 7 day prior   Type of Anesthesia:  Not Indicated   Additional requests/questions:    Burnett Sheng   09/28/2022, 12:59 PM

## 2022-09-28 NOTE — Telephone Encounter (Addendum)
Forms faxed back to [facility], Dr. Yves Dill,  asking them to send to Dr. Mariah Milling to have him complete clearance for Shawn Lam per Dr. Ermalene Searing.

## 2022-10-07 DIAGNOSIS — M47812 Spondylosis without myelopathy or radiculopathy, cervical region: Secondary | ICD-10-CM | POA: Diagnosis not present

## 2022-10-12 DIAGNOSIS — N1831 Chronic kidney disease, stage 3a: Secondary | ICD-10-CM | POA: Diagnosis not present

## 2022-10-12 DIAGNOSIS — N2581 Secondary hyperparathyroidism of renal origin: Secondary | ICD-10-CM | POA: Diagnosis not present

## 2022-10-12 DIAGNOSIS — R809 Proteinuria, unspecified: Secondary | ICD-10-CM | POA: Diagnosis not present

## 2022-10-18 ENCOUNTER — Other Ambulatory Visit: Payer: Self-pay | Admitting: Family Medicine

## 2022-10-20 DIAGNOSIS — M47812 Spondylosis without myelopathy or radiculopathy, cervical region: Secondary | ICD-10-CM | POA: Diagnosis not present

## 2022-10-26 ENCOUNTER — Other Ambulatory Visit: Payer: Self-pay | Admitting: Family Medicine

## 2022-10-26 NOTE — Telephone Encounter (Signed)
Last office visit 07/14/22 for neck pain.  Last refilled 09/28/22 for #30 with no refills.  Next Appt: CPE 04/07/23.

## 2022-11-02 DIAGNOSIS — G4733 Obstructive sleep apnea (adult) (pediatric): Secondary | ICD-10-CM | POA: Diagnosis not present

## 2022-11-02 DIAGNOSIS — F5101 Primary insomnia: Secondary | ICD-10-CM | POA: Diagnosis not present

## 2022-11-02 DIAGNOSIS — Z9682 Presence of neurostimulator: Secondary | ICD-10-CM | POA: Diagnosis not present

## 2022-11-09 DIAGNOSIS — M47812 Spondylosis without myelopathy or radiculopathy, cervical region: Secondary | ICD-10-CM | POA: Diagnosis not present

## 2022-11-14 ENCOUNTER — Other Ambulatory Visit: Payer: Self-pay | Admitting: Family Medicine

## 2022-11-14 DIAGNOSIS — R21 Rash and other nonspecific skin eruption: Secondary | ICD-10-CM

## 2022-11-14 NOTE — Telephone Encounter (Signed)
Last office visit 07/14/22 for Chronic neck pain.  Last refilled 06/02/22 for 454 g with no refills.  Next appt: CPE 04/07/2023.

## 2022-11-16 ENCOUNTER — Telehealth: Payer: Self-pay | Admitting: Cardiovascular Disease

## 2022-11-16 NOTE — Telephone Encounter (Signed)
Patient is looking for a cheaper medication than midodrine.

## 2022-11-16 NOTE — Telephone Encounter (Signed)
Pt c/o medication issue:  1. Name of Medication:   midodrine (PROAMATINE) 10 MG tablet    2. How are you currently taking this medication (dosage and times per day)? As written  3. Are you having a reaction (difficulty breathing--STAT)? No   4. What is your medication issue? Pt called in stating this medication has gotten too expensive and wants to know if Dr. Mariah Milling has any other alternatives he can take.

## 2022-11-17 ENCOUNTER — Other Ambulatory Visit: Payer: Self-pay | Admitting: Family Medicine

## 2022-11-21 DIAGNOSIS — H5203 Hypermetropia, bilateral: Secondary | ICD-10-CM | POA: Diagnosis not present

## 2022-11-21 DIAGNOSIS — H40013 Open angle with borderline findings, low risk, bilateral: Secondary | ICD-10-CM | POA: Diagnosis not present

## 2022-11-21 DIAGNOSIS — H43812 Vitreous degeneration, left eye: Secondary | ICD-10-CM | POA: Diagnosis not present

## 2022-11-21 DIAGNOSIS — H47013 Ischemic optic neuropathy, bilateral: Secondary | ICD-10-CM | POA: Diagnosis not present

## 2022-11-21 DIAGNOSIS — H52223 Regular astigmatism, bilateral: Secondary | ICD-10-CM | POA: Diagnosis not present

## 2022-11-21 DIAGNOSIS — Z961 Presence of intraocular lens: Secondary | ICD-10-CM | POA: Diagnosis not present

## 2022-11-21 DIAGNOSIS — H524 Presbyopia: Secondary | ICD-10-CM | POA: Diagnosis not present

## 2022-11-21 DIAGNOSIS — D3142 Benign neoplasm of left ciliary body: Secondary | ICD-10-CM | POA: Diagnosis not present

## 2022-11-21 NOTE — Telephone Encounter (Signed)
Called and spoke with patient. Informed him of the following from Dr. Mariah Milling.  No other generic equivalent for midodrine  Would recommend he check out other alternatives for midodrine  Such as buying it at cost plus  https://[email or web address]/medications/[email or web address] -tablet/  $15  or goodrx.com, $30  Thx  TGollan   Patient verbalizes understanding.

## 2022-11-22 ENCOUNTER — Other Ambulatory Visit: Payer: Self-pay | Admitting: Family Medicine

## 2022-11-22 NOTE — Telephone Encounter (Signed)
Last office visit 07/14/22 for Chronic neck pain.  Last refilled 08/19/22 for #120 with 2 refills.  Next appt: CPE 04/07/2023.

## 2022-11-28 ENCOUNTER — Other Ambulatory Visit: Payer: Self-pay | Admitting: Family Medicine

## 2022-11-28 NOTE — Telephone Encounter (Signed)
Last office visit 07/14/22 for chronic neck pain. Last refilled 10/30/2022 for #30 with no refills.  Next appt: CPE 04/07/2023.

## 2022-12-14 DIAGNOSIS — F5101 Primary insomnia: Secondary | ICD-10-CM | POA: Diagnosis not present

## 2022-12-14 DIAGNOSIS — Z23 Encounter for immunization: Secondary | ICD-10-CM | POA: Diagnosis not present

## 2022-12-14 DIAGNOSIS — G4733 Obstructive sleep apnea (adult) (pediatric): Secondary | ICD-10-CM | POA: Diagnosis not present

## 2022-12-14 DIAGNOSIS — Z9682 Presence of neurostimulator: Secondary | ICD-10-CM | POA: Diagnosis not present

## 2022-12-19 DIAGNOSIS — M5030 Other cervical disc degeneration, unspecified cervical region: Secondary | ICD-10-CM | POA: Diagnosis not present

## 2022-12-19 DIAGNOSIS — M47812 Spondylosis without myelopathy or radiculopathy, cervical region: Secondary | ICD-10-CM | POA: Diagnosis not present

## 2022-12-19 DIAGNOSIS — M180 Bilateral primary osteoarthritis of first carpometacarpal joints: Secondary | ICD-10-CM | POA: Diagnosis not present

## 2022-12-20 DIAGNOSIS — R519 Headache, unspecified: Secondary | ICD-10-CM | POA: Diagnosis not present

## 2022-12-20 DIAGNOSIS — Z8679 Personal history of other diseases of the circulatory system: Secondary | ICD-10-CM | POA: Diagnosis not present

## 2022-12-20 DIAGNOSIS — M5481 Occipital neuralgia: Secondary | ICD-10-CM | POA: Diagnosis not present

## 2022-12-20 DIAGNOSIS — R251 Tremor, unspecified: Secondary | ICD-10-CM | POA: Diagnosis not present

## 2022-12-20 DIAGNOSIS — R42 Dizziness and giddiness: Secondary | ICD-10-CM | POA: Diagnosis not present

## 2022-12-26 ENCOUNTER — Other Ambulatory Visit: Payer: Self-pay | Admitting: Family Medicine

## 2022-12-26 DIAGNOSIS — N2581 Secondary hyperparathyroidism of renal origin: Secondary | ICD-10-CM | POA: Diagnosis not present

## 2022-12-26 DIAGNOSIS — N1831 Chronic kidney disease, stage 3a: Secondary | ICD-10-CM | POA: Diagnosis not present

## 2022-12-26 DIAGNOSIS — D631 Anemia in chronic kidney disease: Secondary | ICD-10-CM | POA: Diagnosis not present

## 2022-12-26 NOTE — Telephone Encounter (Signed)
Last office visit 07/14/22 for chronic neck pain. Last refilled 11/29/2022 for #30 with no refills.  Next appt: CPE 04/07/2023.

## 2023-01-05 DIAGNOSIS — R3915 Urgency of urination: Secondary | ICD-10-CM | POA: Diagnosis not present

## 2023-01-05 DIAGNOSIS — Z125 Encounter for screening for malignant neoplasm of prostate: Secondary | ICD-10-CM | POA: Diagnosis not present

## 2023-01-05 DIAGNOSIS — M180 Bilateral primary osteoarthritis of first carpometacarpal joints: Secondary | ICD-10-CM | POA: Diagnosis not present

## 2023-01-05 DIAGNOSIS — N200 Calculus of kidney: Secondary | ICD-10-CM | POA: Diagnosis not present

## 2023-01-05 DIAGNOSIS — R34 Anuria and oliguria: Secondary | ICD-10-CM | POA: Diagnosis not present

## 2023-01-09 ENCOUNTER — Telehealth: Payer: Self-pay | Admitting: Cardiovascular Disease

## 2023-01-09 DIAGNOSIS — M4802 Spinal stenosis, cervical region: Secondary | ICD-10-CM | POA: Diagnosis not present

## 2023-01-09 DIAGNOSIS — M180 Bilateral primary osteoarthritis of first carpometacarpal joints: Secondary | ICD-10-CM | POA: Diagnosis not present

## 2023-01-09 DIAGNOSIS — M5412 Radiculopathy, cervical region: Secondary | ICD-10-CM | POA: Diagnosis not present

## 2023-01-09 DIAGNOSIS — M47812 Spondylosis without myelopathy or radiculopathy, cervical region: Secondary | ICD-10-CM | POA: Diagnosis not present

## 2023-01-09 DIAGNOSIS — M5030 Other cervical disc degeneration, unspecified cervical region: Secondary | ICD-10-CM | POA: Diagnosis not present

## 2023-01-09 NOTE — Telephone Encounter (Signed)
Follow Up:     Clydie Braun called back to see if patient can hold Plavix 7 days instead of 5 days?

## 2023-01-09 NOTE — Telephone Encounter (Signed)
Pre-operative Risk Assessment    Patient Name: Shawn Lam  DOB: 1951-01-13 MRN: [id number]      Request for Surgical Clearance    Procedure:  Cervical ESI  Date of Surgery:  Clearance 01/20/23                                 Surgeon:  not indicated Surgeon's Group or Practice Name:  [facility] Phone number:  [phone] Fax number:  [phone]   Type of Clearance Requested:  Hold Plavix 7days prior    Type of Anesthesia:  Not Indicated   Additional requests/questions:    Signed, Narda Amber   01/09/2023, 1:48 PM

## 2023-01-09 NOTE — Telephone Encounter (Addendum)
Shawn Lam, CMA Certified Medical Assistant   Telephone Encounter Sign at exiting of workspace   Creation Time: 01/09/2023  4:27 PM   Sign at exiting of workspace     I will forward back to pre op to see notes for holding Plavix x 7.days prior.           Tag   Copy Laurence Ferrari   Telephone Encounter Signed   Creation Time: 01/09/2023  4:16 PM   Signed     Follow Up:         Shawn Lam called back to see if patient can hold Plavix 7 days instead of 5 days?

## 2023-01-09 NOTE — Telephone Encounter (Addendum)
Patient Name: Shawn Lam  DOB: [birth year] MRN: [id number]  Primary Cardiologist: Julien Nordmann, MD  Chart reviewed as part of pre-operative protocol coverage.    Per protocol patient can hold Plavix 7 days prior to procedure and should restart postprocedure when surgically safe.  Napoleon Form, Leodis Rains, NP 01/09/2023, 2:07 PM

## 2023-01-09 NOTE — Telephone Encounter (Signed)
I will forward back to pre op to see notes for holding Plavix x 7.

## 2023-01-09 NOTE — Telephone Encounter (Signed)
Please see addended note with 7-day hold time and will refax note to requesting provider with correction.

## 2023-01-10 ENCOUNTER — Telehealth: Payer: Self-pay | Admitting: Cardiovascular Disease

## 2023-01-10 NOTE — Telephone Encounter (Signed)
Clearance letter has been refaxed.   Contacted requesting office and informed them if they do not receive fax we will resend clearance request. She voiced understanding.

## 2023-01-10 NOTE — Telephone Encounter (Signed)
[facility] called wanting a note for the hold for plavix for 7 days to be refaxed stating to "Per protocol patient can hold Plavix 7 days prior to procedure and should restart postprocedure when surgically safe".

## 2023-01-12 DIAGNOSIS — M5412 Radiculopathy, cervical region: Secondary | ICD-10-CM | POA: Diagnosis not present

## 2023-01-12 DIAGNOSIS — M542 Cervicalgia: Secondary | ICD-10-CM | POA: Diagnosis not present

## 2023-01-20 DIAGNOSIS — M5412 Radiculopathy, cervical region: Secondary | ICD-10-CM | POA: Diagnosis not present

## 2023-01-20 DIAGNOSIS — M4802 Spinal stenosis, cervical region: Secondary | ICD-10-CM | POA: Diagnosis not present

## 2023-01-23 DIAGNOSIS — G4733 Obstructive sleep apnea (adult) (pediatric): Secondary | ICD-10-CM | POA: Diagnosis not present

## 2023-01-25 ENCOUNTER — Other Ambulatory Visit: Payer: Self-pay | Admitting: Family Medicine

## 2023-01-25 DIAGNOSIS — F5101 Primary insomnia: Secondary | ICD-10-CM | POA: Diagnosis not present

## 2023-01-25 DIAGNOSIS — Z9682 Presence of neurostimulator: Secondary | ICD-10-CM | POA: Diagnosis not present

## 2023-01-25 DIAGNOSIS — G4733 Obstructive sleep apnea (adult) (pediatric): Secondary | ICD-10-CM | POA: Diagnosis not present

## 2023-01-25 NOTE — Telephone Encounter (Signed)
Last office visit 07/14/2022 for neck pain.  Last refilled 12/27/2022 for #30 with no refills.  Next Appt: CPE 04/07/2023.

## 2023-02-09 ENCOUNTER — Telehealth: Payer: Self-pay | Admitting: Cardiovascular Disease

## 2023-02-09 DIAGNOSIS — R519 Headache, unspecified: Secondary | ICD-10-CM | POA: Diagnosis not present

## 2023-02-09 DIAGNOSIS — M5412 Radiculopathy, cervical region: Secondary | ICD-10-CM | POA: Diagnosis not present

## 2023-02-09 DIAGNOSIS — M5481 Occipital neuralgia: Secondary | ICD-10-CM | POA: Diagnosis not present

## 2023-02-09 DIAGNOSIS — M180 Bilateral primary osteoarthritis of first carpometacarpal joints: Secondary | ICD-10-CM | POA: Diagnosis not present

## 2023-02-09 DIAGNOSIS — G43719 Chronic migraine without aura, intractable, without status migrainosus: Secondary | ICD-10-CM | POA: Diagnosis not present

## 2023-02-09 DIAGNOSIS — M4802 Spinal stenosis, cervical region: Secondary | ICD-10-CM | POA: Diagnosis not present

## 2023-02-09 DIAGNOSIS — R251 Tremor, unspecified: Secondary | ICD-10-CM | POA: Diagnosis not present

## 2023-02-09 DIAGNOSIS — M47812 Spondylosis without myelopathy or radiculopathy, cervical region: Secondary | ICD-10-CM | POA: Diagnosis not present

## 2023-02-09 NOTE — Telephone Encounter (Signed)
Pre-operative Risk Assessment    Patient Name: Shawn Lam  DOB: 1951/03/02 MRN: [id number]      Request for Surgical Clearance    Procedure:  Second Cervical epidural   Date of Surgery:  Clearance 02/21/23                                 Surgeon:  not indicated Surgeon's Group or Practice Name:  [facility] Phone number:  [phone] Fax number:  [phone]   Type of Clearance Requested:  Hold Plavix 7 days prior    Type of Anesthesia:  Not Indicated   Additional requests/questions:    Signed, Narda Amber   02/09/2023, 3:51 PM

## 2023-02-10 NOTE — Telephone Encounter (Signed)
   Name: Shawn Lam  DOB: Apr 23, 1950  MRN: [id number]   Primary Cardiologist: Julien Nordmann, MD  Chart reviewed as part of pre-operative protocol coverage.   Per office protocol, he may hold Plavix for 5-7 days prior to procedure and should resume as soon as hemodynamically stable postoperatively.   I will route this recommendation to the requesting party via Epic fax function and remove from pre-op pool. Please call with questions.  Carlos Levering, NP 02/10/2023, 11:15 AM

## 2023-02-21 DIAGNOSIS — M4802 Spinal stenosis, cervical region: Secondary | ICD-10-CM | POA: Diagnosis not present

## 2023-02-21 DIAGNOSIS — M5412 Radiculopathy, cervical region: Secondary | ICD-10-CM | POA: Diagnosis not present

## 2023-02-23 ENCOUNTER — Other Ambulatory Visit: Payer: Self-pay | Admitting: Family Medicine

## 2023-02-23 NOTE — Telephone Encounter (Signed)
Last office visit 07/14/2022 for neck pain.  Last refilled 01/25/2023 for #30 with no refills.  Next Appt: CPE 04/07/2023.

## 2023-03-02 DIAGNOSIS — M5412 Radiculopathy, cervical region: Secondary | ICD-10-CM | POA: Diagnosis not present

## 2023-03-03 ENCOUNTER — Other Ambulatory Visit: Payer: Self-pay | Admitting: Neurosurgery

## 2023-03-03 ENCOUNTER — Telehealth: Payer: Self-pay | Admitting: Cardiovascular Disease

## 2023-03-03 NOTE — Telephone Encounter (Signed)
   Pre-operative Risk Assessment    Patient Name: Shawn Lam  DOB: [birth year] MRN: [id number]      Request for Surgical Clearance    Procedure:   C4-5, C5-6 Anterior Cervical Fusion  Date of Surgery:  Clearance 03/13/23                                 Surgeon:  Royce Macadamia Surgeon's Group or Practice Name:  [facility] Phone number:  [phone] Fax number:  [phone]   Type of Clearance Requested:   - Medical  - Pharmacy:  Hold Clopidogrel (Plavix) 7 Days before surgery   Type of Anesthesia:  General    Additional requests/questions:    SignedMaceo Pro Schools   03/03/2023, 3:17 PM

## 2023-03-06 NOTE — Telephone Encounter (Signed)
ADDENDUM: DR. GARY CRAM IS THE SURGEON. Pt has been scheduled in office appt with Charlsie Quest, NP 03/07/23 9:15. Pt said he is supposed to have preop at the hospital tomorrow at 10 am as well. I reviewed the schedules for [facility] and [facility] for tomorrow as pt will be in [place], but no open appts. Pt will call the hospital and change his pre op appt there as he needs appt with cardiology as well.

## 2023-03-06 NOTE — Telephone Encounter (Signed)
   Name: Shawn Lam  DOB: 08-05-1950  MRN: [id number]  Primary Cardiologist: Julien Nordmann, MD  Chart reviewed as part of pre-operative protocol coverage. Because of [name] past medical history and time since last visit, he will require a follow-up in-office visit in order to better assess preoperative cardiovascular risk.  Pre-op covering staff: - Please schedule appointment and call patient to inform them. If patient already had an upcoming appointment within acceptable timeframe, please add "pre-op clearance" to the appointment notes so provider is aware. - Please contact requesting surgeon's office via preferred method (i.e, phone, fax) to inform them of need for appointment prior to surgery.  If asymptomatic at the time of visit, can hold Plavix x 7 days prior to the procedure and resume when medically safe to do so.  Sharlene Dory, PA-C  03/06/2023, 7:25 AM

## 2023-03-07 ENCOUNTER — Ambulatory Visit: Payer: Medicare Other | Attending: Cardiology | Admitting: Cardiology

## 2023-03-07 ENCOUNTER — Other Ambulatory Visit (HOSPITAL_COMMUNITY): Payer: Medicare Other

## 2023-03-07 ENCOUNTER — Other Ambulatory Visit: Payer: Self-pay | Admitting: Family Medicine

## 2023-03-07 ENCOUNTER — Encounter: Payer: Self-pay | Admitting: Cardiology

## 2023-03-07 VITALS — BP 130/78 | HR 71 | Ht 72.0 in | Wt 136.8 lb

## 2023-03-07 DIAGNOSIS — I2510 Atherosclerotic heart disease of native coronary artery without angina pectoris: Secondary | ICD-10-CM | POA: Diagnosis not present

## 2023-03-07 DIAGNOSIS — G4733 Obstructive sleep apnea (adult) (pediatric): Secondary | ICD-10-CM | POA: Insufficient documentation

## 2023-03-07 DIAGNOSIS — N183 Chronic kidney disease, stage 3 unspecified: Secondary | ICD-10-CM | POA: Insufficient documentation

## 2023-03-07 DIAGNOSIS — E785 Hyperlipidemia, unspecified: Secondary | ICD-10-CM | POA: Insufficient documentation

## 2023-03-07 DIAGNOSIS — I951 Orthostatic hypotension: Secondary | ICD-10-CM | POA: Insufficient documentation

## 2023-03-07 DIAGNOSIS — Z01810 Encounter for preprocedural cardiovascular examination: Secondary | ICD-10-CM | POA: Insufficient documentation

## 2023-03-07 NOTE — Progress Notes (Signed)
Cardiology Office Note:  .   Date:  03/07/2023  ID:  Shawn Lam, DOB [birth year], MRN [id number] PCP: Shawn Seltzer, MD  [facility] Providers Cardiologist:  Shawn Nordmann, MD    History of Present Illness: .   Shawn Lam is a 71 y.o. male with a history of coronary artery disease status post PCI to the LAD and RCA, recurrent syncope secondary felt to be secondary to orthostatic hypotension, frequent PVCs, hyperlipidemia, mildly dilated aortic root, CKD stage III, iron deficiency anemia, hypothyroidism, OSA status post inspire device, and lymphedema, who presents for follow-up and preoperative cardiovascular examination.   Cardiac MRI 10/2013 demonstrated normal LVEF and RVEF with no evidence of LGE.  He underwent loop recorder implantation in 05/2016 with device interrogation showed no evidence of significant arrhythmia, prolonged pauses, or high-grade AV block.  Lexiscan MPI completed in 11/2018 showed no evidence of ischemia and was overall low risk with an EF of 55 to 65%.  Echocardiogram from 11/2018 demonstrated an EF of 60-65%, diastolic dysfunction, no RWMA, normal RV systolic function, and aortic valve to be normal, no significant valvular abnormalities.  He was seen in the office 04/2021 and noted a syncopal episode around Christmas in 2022 associated with standing.  He also reported palpitations while lying down at night.  He remained on midodrine 10 mg 3 times daily.  He was evaluated again in April 2023 for recurrent syncope and preoperative cardiac risk stratification for TURP.  Symptoms of syncope continued in the context of positional dizziness.  He was without symptoms of angina or decompensation.  He underwent repeat event monitoring which demonstrated predominant rhythm was sinus with an average heart rate of 60 bpm, 11 episodes of SVT frequent PVCs with a burden of 8.4, given PVC burden in the context of significant orthostasis he was referred to EP.  Repeat  echocardiogram demonstrated EF of 50%, global hypokinesis, normal RV systolic function, and estimated right atrial pressure of 15 mmHg.  EP evaluation was completed 08/28/2021 with a conservative approach recommended given the burden of less than 10% PVCs.  Subsequent Lexiscan MPI in 09/2021 showed no evidence of ischemia.  Echo 1 month prior demonstrated preserved LV systolic function, LAD and RCA calcifications were noted on CT attenuation corrected images but overall was considered a low risk study.   He was last seen in clinic 06/21/2022 by Shawn Lam.  At that time he was complaining of some dizziness that started approximately a year prior.  It was noted he had had several falls in the past from orthostatic hypotension with his last fall being 04/23/2022.  He was encouraged to continue to take midodrine 3 times daily first thing in the morning and.  Palpitations were occasional but ZIO monitoring could be ordered if his symptoms were worsened.  There were no other changes to his medication regimen that were made and no further testing that was ordered.  He returns to clinic today stating that he has been doing well from a cardiac perspective.  He denies any chest pain, chest tightness, palpitations, or peripheral edema.  He continues to have chronic shortness of breath that is unchanged for the last 6 months.  States on occasion he continues to have dizziness but has had no recurrent falls and does not suffer from recurrent orthostatic hypotension.  He states that he has been compliant with his current medication regimen.  Continues to some weight loss of unknown origin.  Has currently been scheduled for ACDF C4-C5  and C5-C6 procedure with Shawn Lam.  Denies any hospitalizations or visits to the emergency department.  ROS: 10 point review of systems has been reviewed and considered negative with exception was been listed in the HPI  Studies Reviewed: [name]   EKG Interpretation Date/Time:  Tuesday March 07 2023 09:19:48 EST Ventricular Rate:  71 PR Interval:  164 QRS Duration:  78 QT Interval:  398 QTC Calculation: 432 R Axis:   64  Text Interpretation: Sinus rhythm with Premature atrial complexes When compared with ECG of 07-Feb-2020 14:38, Left ventricular hypertrophy Confirmed by Shawn Lam ([id number]) on 03/07/2023 9:25:11 AM    Lexiscan MPI 09/22/2021:   The study is low risk.   There is no evidence of ischemia   Left ventricular function visually appears normal. Calculated EF is 39%. correlation with echo advised   LAD and RCA calcifications noted   Image quality degraded by diaphragmatic artifacts.   2D echo 08/19/2021: 1. Left ventricular ejection fraction, by estimation, is 50% . The left  ventricle has low normal function. The left ventricle demonstrates global  hypokinesis.   2. Right ventricular systolic function is normal. The right ventricular  size is normal. There is normal pulmonary artery systolic pressure. The  estimated right ventricular systolic pressure is 35.2 mmHg.   3. The mitral valve is normal in structure. Mild mitral valve  regurgitation. No evidence of mitral stenosis.   4. The aortic valve was not well visualized. Aortic valve regurgitation  is not visualized. No aortic stenosis is present.   5. The inferior vena cava is dilated in size with <50% respiratory  variability, suggesting right atrial pressure of 15 mmHg.   6. PVCs noted   Comparison(s): EF 60-65%.   Zio patch 07/2021: Normal sinus rhythm Patient had a min HR of 40 bpm, max HR of 193 bpm, and avg HR of 68 bpm.    11 Supraventricular Tachycardia runs occurred, the run with the fastest interval lasting 4 beats with a max rate of 193 bpm, the longest lasting 15 beats with an avg rate of 111 bpm.  Junctional Rhythm was present.   IIsolated SVEs were occasional (1.7%, [id number]), SVE Couplets were rare (<1.0%, 72), and SVE Triplets were rare (<1.0%, 11). Isolated VEs were frequent (8.4%, [id number]),  VE Couplets were rare (<1.0%, 8), and VE Triplets were rare (<1.0%, 1). Ventricular Bigeminy and Trigeminy were present.   Triggered events associated with PVCs   2D echo 11/30/2018: 1. The left ventricle has normal systolic function with an ejection  fraction of 60-65%. The cavity size was normal. Left ventricular diastolic  Doppler parameters are consistent with impaired relaxation. No evidence of  left ventricular regional wall  motion abnormalities.   2. The right ventricle has normal systolic function. The cavity was  normal. There is no increase in right ventricular wall thickness. Right  ventricular systolic pressure is normal with an estimated pressure of 21.3  mmHg.   Lexiscan MPI 11/29/2018: There was no ST segment deviation noted during stress. No T wave inversion was noted during stress. The study is normal. This is a low risk study. The left ventricular ejection fraction is normal (55-65%).   2D echo 07/05/2017: - Left ventricle: The cavity size was normal. Wall thickness was    increased in a pattern of mild LVH. Systolic function was normal.    The estimated ejection fraction was in the range of 60% to 65%.    Wall motion was normal; there were no regional  wall motion    abnormalities. Doppler parameters are consistent with abnormal    left ventricular relaxation (grade 1 diastolic dysfunction). The    E/e&' ratio is <8, suggesting normal LV filling pressure.  - Aorta: The sinus of valsalva is dilated at 4.0 cm.  - Left atrium: The atrium was normal in size.  - Right atrium: The atrium was at the upper limits of normal in    size.  - Tricuspid valve: There was trivial regurgitation.  - Pulmonary arteries: PA peak pressure: 22 mm Hg (S).  - Inferior vena cava: The vessel was normal in size. The    respirophasic diameter changes were in the normal range (= 50%),    consistent with normal central venous pressure.   Impressions:   - Compared to a prior study in  06/2016, the LVEF is higher at    60-65%. The aorta is dilated to 4.0 cm at the sinus of valsalva,    but measures 3.5 cm at the aortic root.   2D echo 06/24/2016: - Left ventricle: The cavity size was normal. There was mild    concentric hypertrophy. Systolic function was normal. The    estimated ejection fraction was in the range of 50% to 55%. There    is hypokinesis of the entireinferior myocardium. Left ventricular    diastolic function parameters were normal.  - Aortic valve: Trileaflet; mildly thickened, mildly calcified    leaflets.  - Aorta: Aortic root dimension: 38 mm (ED).  - Aortic root: The aortic root was mildly dilated.  - Mitral valve: Calcified annulus as well as calcified papillatry    muscle There was trivial regurgitation.  - Tricuspid valve: There was trivial regurgitation.   Outpatient cardiac monitoring 03/2016: Normal SInus Rhythm with the average heart rate 68bpm. Occasional ventricular couplets   Nuclear stress test 02/23/2016: Nuclear stress EF: 42%. Blood pressure demonstrated a normal response to exercise. There was no ST segment deviation noted during stress. Defect 1: There is a medium defect of moderate severity present in the basal inferior and mid inferior location. The left ventricular ejection fraction is moderately decreased (30-44%). This is a low risk study.   Low risk stress nuclear study with inferior thinning vs prior infarct; no ischemia; EF 42 with global hypokinesis and moderate LVE.   Cardiac MRI 11/04/2013: IMPRESSION:  1.  Normal LV size and systolic function, EF 59%. 2.  Normal RV size and systolic function. 3. No myocardial delayed enhancement noted, so no definitive  evidence for prior MI, myocarditis, or infiltrative disease.   2D echo 10/07/2013: - Left ventricle: The cavity size was normal. Wall thickness was    normal. Systolic function was moderately reduced. The estimated    ejection fraction was in the range of 35% to  40%. Moderate    hypokinesis of the entire myocardium. Risk Assessment/Calculations:             Physical Exam:   VS:  BP 130/78 (BP Location: Left Arm, Patient Position: Sitting, Cuff Size: Normal)   Pulse 71   Ht 6' (1.829 m)   Wt 136 lb 12.8 oz (62.1 kg)   SpO2 100%   BMI 18.55 kg/m    Wt Readings from Last 3 Encounters:  03/07/23 136 lb 12.8 oz (62.1 kg)  07/14/22 146 lb (66.2 kg)  07/04/22 150 lb (68 kg)    GEN: Well nourished, well developed in no acute distress NECK: No JVD; No carotid bruits CARDIAC: RRR, no murmurs, rubs,  gallops RESPIRATORY:  Clear to auscultation without rales, wheezing or rhonchi  ABDOMEN: Soft, non-tender, non-distended EXTREMITIES:  No edema; No deformity   ASSESSMENT AND PLAN: .   Coronary artery disease with remote PCI to the LAD and RCA.  No symptoms of angina.  He is continued on aspirin 81 mg daily atorvastatin 80 mg daily clopidogrel 75 mg daily.  No further ischemic testing is needed at this time.  Orthostatic hypotension with a blood pressure today 130/78.  He is continued on midodrine 10 mg 3 times daily and has benefited from the medication when taking it first thing in the morning prior to getting up and moving.  Has not needed anticoagulation.  Has had an additional 10 pound weight loss since over the last several months with no known etiology.  This continues to be followed by his PCP.  Hyperlipidemia his LDL has remained at goal.  He is continued on atorvastatin 80 mg daily.  CKD stage III with last serum creatinine 1.46 on 12/26/2022.  Kidney function remained stable.    Obstructive sleep apnea where he was intolerant to CPAP therapy.  He had an inspire device implanted in 2019.  No recurrence of symptoms.  Cardiovascular perioperative examination where patient denies any chest pain or changes in shortness of breath.  According to ACC/guidelines no further cardiovascular testing is needed.  The patient may proceed with surgery at  acceptable risk     Mr. Ertman perioperative risk of a major cardiac event is 0.9% according to the Revised Cardiac Risk Index (RCRI).  Therefore, he is at high risk for perioperative complications.   His functional capacity is fair at 4.64 METs according to the Duke Activity Status Index (DASI). Recommendations: According to ACC/AHA guidelines, no further cardiovascular testing needed.  The patient may proceed to surgery at acceptable risk.         Dispo: Patient return to clinic to see MD/APP 34 months or sooner if needed  Signed, Terecia Plaut, NP

## 2023-03-07 NOTE — Telephone Encounter (Signed)
Note was forwarded to the surgeon after his visit

## 2023-03-07 NOTE — Patient Instructions (Signed)
Medication Instructions:  - No changes *If you need a refill on your cardiac medications before your next appointment, please call your pharmacy*  Lab Work: - None ordered  Testing/Procedures: - None ordered  Follow-Up: At [facility], you and your health needs are our priority.  As part of our continuing mission to provide you with exceptional heart care, we have created designated Provider Care Teams.  These Care Teams include your primary Cardiologist (physician) and Advanced Practice Providers (APPs -  Physician Assistants and Nurse Practitioners) who all work together to provide you with the care you need, when you need it.  Your next appointment:   3 - 4 month(s)  Provider:   Charlsie Quest, NP    Other Instructions - None ordered

## 2023-03-07 NOTE — Pre-Procedure Instructions (Signed)
Surgical Instructions   Your procedure is scheduled on March 13, 2023. Report to [facility] Main Entrance "A" at 7:50 A.M., then check in with the Admitting office. Any questions or running late day of surgery: call [phone]  Questions prior to your surgery date: call [phone], Monday-Friday, 8am-4pm. If you experience any cold or flu symptoms such as cough, fever, chills, shortness of breath, etc. between now and your scheduled surgery, please notify us at the above number.     Remember:  Do not eat or drink after midnight the night before your surgery    Take these medicines the morning of surgery with A SIP OF WATER: atorvastatin (LIPITOR)  buPROPion (WELLBUTRIN XL)  busPIRone (BUSPAR)  finasteride (PROSCAR)  levothyroxine (SYNTHROID)  midodrine (PROAMATINE)  primidone (MYSOLINE)    May take these medicines IF NEEDED: HYDROcodone-acetaminophen (NORCO)  nitroGLYCERIN (NITROSTAT) - if dose taken prior to surgery, please call one of the above phone numbers   Follow your surgeon's instructions on when to stop Aspirin and clopidogrel (PLAVIX).  If no instructions were given by your surgeon then you will need to call the office to get those instructions.     One week prior to surgery, STOP taking any Aleve, Naproxen, Ibuprofen, Motrin, Advil, Goody's, BC's, all herbal medications, fish oil, and non-prescription vitamins.                     Do NOT Smoke (Tobacco/Vaping) for 24 hours prior to your procedure.  If you use a CPAP at night, you may bring your mask/headgear for your overnight stay.   You will be asked to remove any contacts, glasses, piercing's, hearing aid's, dentures/partials prior to surgery. Please bring cases for these items if needed.    Patients discharged the day of surgery will not be allowed to drive home, and someone needs to stay with them for 24 hours.  SURGICAL WAITING ROOM VISITATION Patients may have no more than 2 support people in the  waiting area - these visitors may rotate.   Pre-op nurse will coordinate an appropriate time for 1 ADULT support person, who may not rotate, to accompany patient in pre-op.  Children under the age of 69 must have an adult with them who is not the patient and must remain in the main waiting area with an adult.  If the patient needs to stay at the hospital during part of their recovery, the visitor guidelines for inpatient rooms apply.  Please refer to the [facility] website for the visitor guidelines for any additional information.   If you received a COVID test during your pre-op visit  it is requested that you wear a mask when out in public, stay away from anyone that may not be feeling well and notify your surgeon if you develop symptoms. If you have been in contact with anyone that has tested positive in the last 10 days please notify you surgeon.      Pre-operative 5 CHG Bathing Instructions   You can play a key role in reducing the risk of infection after surgery. Your skin needs to be as free of germs as possible. You can reduce the number of germs on your skin by washing with CHG (chlorhexidine gluconate) soap before surgery. CHG is an antiseptic soap that kills germs and continues to kill germs even after washing.   DO NOT use if you have an allergy to chlorhexidine/CHG or antibacterial soaps. If your skin becomes reddened or irritated, stop using the  CHG and notify one of our RNs at [phone].   Please shower with the CHG soap starting 4 days before surgery using the following schedule:     Please keep in mind the following:  DO NOT shave, including legs and underarms, starting the day of your first shower.   You may shave your face at any point before/day of surgery.  Place clean sheets on your bed the day you start using CHG soap. Use a clean washcloth (not used since being washed) for each shower. DO NOT sleep with pets once you start using the CHG.   CHG Shower  Instructions:  Wash your face and private area with normal soap. If you choose to wash your hair, wash first with your normal shampoo.  After you use shampoo/soap, rinse your hair and body thoroughly to remove shampoo/soap residue.  Turn the water OFF and apply about 3 tablespoons (45 ml) of CHG soap to a CLEAN washcloth.  Apply CHG soap ONLY FROM YOUR NECK DOWN TO YOUR TOES (washing for 3-5 minutes)  DO NOT use CHG soap on face, private areas, open wounds, or sores.  Pay special attention to the area where your surgery is being performed.  If you are having back surgery, having someone wash your back for you may be helpful. Wait 2 minutes after CHG soap is applied, then you may rinse off the CHG soap.  Pat dry with a clean towel  Put on clean clothes/pajamas   If you choose to wear lotion, please use ONLY the CHG-compatible lotions on the back of this paper.   Additional instructions for the day of surgery: DO NOT APPLY any lotions, deodorants, cologne, or perfumes.   Do not bring valuables to the hospital. [facility] is not responsible for any belongings/valuables. Do not wear nail polish, gel polish, artificial nails, or any other type of covering on natural nails (fingers and toes) Do not wear jewelry or makeup Put on clean/comfortable clothes.  Please brush your teeth.  Ask your nurse before applying any prescription medications to the skin.     CHG Compatible Lotions   Aveeno Moisturizing lotion  Cetaphil Moisturizing Cream  Cetaphil Moisturizing Lotion  Clairol Herbal Essence Moisturizing Lotion, Dry Skin  Clairol Herbal Essence Moisturizing Lotion, Extra Dry Skin  Clairol Herbal Essence Moisturizing Lotion, Normal Skin  Curel Age Defying Therapeutic Moisturizing Lotion with Alpha Hydroxy  Curel Extreme Care Body Lotion  Curel Soothing Hands Moisturizing Hand Lotion  Curel Therapeutic Moisturizing Cream, Fragrance-Free  Curel Therapeutic Moisturizing Lotion,  Fragrance-Free  Curel Therapeutic Moisturizing Lotion, Original Formula  Eucerin Daily Replenishing Lotion  Eucerin Dry Skin Therapy Plus Alpha Hydroxy Crme  Eucerin Dry Skin Therapy Plus Alpha Hydroxy Lotion  Eucerin Original Crme  Eucerin Original Lotion  Eucerin Plus Crme Eucerin Plus Lotion  Eucerin TriLipid Replenishing Lotion  Keri Anti-Bacterial Hand Lotion  Keri Deep Conditioning Original Lotion Dry Skin Formula Softly Scented  Keri Deep Conditioning Original Lotion, Fragrance Free Sensitive Skin Formula  Keri Lotion Fast Absorbing Fragrance Free Sensitive Skin Formula  Keri Lotion Fast Absorbing Softly Scented Dry Skin Formula  Keri Original Lotion  Keri Skin Renewal Lotion Keri Silky Smooth Lotion  Keri Silky Smooth Sensitive Skin Lotion  Nivea Body Creamy Conditioning Oil  Nivea Body Extra Enriched Teacher, adult education Moisturizing Lotion Nivea Crme  Nivea Skin Firming Lotion  NutraDerm 30 Skin Lotion  NutraDerm Skin Lotion  NutraDerm Therapeutic Skin Cream  NutraDerm Therapeutic  Skin Lotion  ProShield Protective Hand Cream  Provon moisturizing lotion  Please read over the following fact sheets that you were given.

## 2023-03-08 ENCOUNTER — Encounter (HOSPITAL_COMMUNITY): Payer: Self-pay

## 2023-03-08 ENCOUNTER — Other Ambulatory Visit: Payer: Self-pay

## 2023-03-08 ENCOUNTER — Encounter (HOSPITAL_COMMUNITY)
Admission: RE | Admit: 2023-03-08 | Discharge: 2023-03-08 | Disposition: A | Discharge status: Home / Self Care | Payer: Medicare Other | Source: Ambulatory Visit | Attending: Neurosurgery | Admitting: Neurosurgery

## 2023-03-08 ENCOUNTER — Other Ambulatory Visit: Payer: Self-pay | Admitting: Neurosurgery

## 2023-03-08 VITALS — BP 142/74 | HR 71 | Temp 98.1°F | Resp 18 | Ht 72.0 in | Wt 139.2 lb

## 2023-03-08 DIAGNOSIS — N02B1 Recurrent and persistent immunoglobulin A nephropathy with glomerular lesion: Secondary | ICD-10-CM | POA: Insufficient documentation

## 2023-03-08 DIAGNOSIS — D649 Anemia, unspecified: Secondary | ICD-10-CM | POA: Insufficient documentation

## 2023-03-08 DIAGNOSIS — I2510 Atherosclerotic heart disease of native coronary artery without angina pectoris: Secondary | ICD-10-CM | POA: Insufficient documentation

## 2023-03-08 DIAGNOSIS — M4802 Spinal stenosis, cervical region: Secondary | ICD-10-CM | POA: Insufficient documentation

## 2023-03-08 DIAGNOSIS — Z955 Presence of coronary angioplasty implant and graft: Secondary | ICD-10-CM | POA: Insufficient documentation

## 2023-03-08 DIAGNOSIS — I491 Atrial premature depolarization: Secondary | ICD-10-CM | POA: Insufficient documentation

## 2023-03-08 DIAGNOSIS — N183 Chronic kidney disease, stage 3 unspecified: Secondary | ICD-10-CM | POA: Insufficient documentation

## 2023-03-08 DIAGNOSIS — I517 Cardiomegaly: Secondary | ICD-10-CM | POA: Diagnosis not present

## 2023-03-08 DIAGNOSIS — G4733 Obstructive sleep apnea (adult) (pediatric): Secondary | ICD-10-CM | POA: Insufficient documentation

## 2023-03-08 DIAGNOSIS — E039 Hypothyroidism, unspecified: Secondary | ICD-10-CM | POA: Diagnosis not present

## 2023-03-08 DIAGNOSIS — Z01812 Encounter for preprocedural laboratory examination: Secondary | ICD-10-CM | POA: Diagnosis not present

## 2023-03-08 DIAGNOSIS — E785 Hyperlipidemia, unspecified: Secondary | ICD-10-CM | POA: Diagnosis not present

## 2023-03-08 DIAGNOSIS — Z01818 Encounter for other preprocedural examination: Secondary | ICD-10-CM

## 2023-03-08 LAB — BASIC METABOLIC PANEL
Anion gap: 8 (ref 5–15)
BUN: 22 mg/dL (ref 8–23)
CO2: 26 mmol/L (ref 22–32)
Calcium: 9.8 mg/dL (ref 8.9–10.3)
Chloride: 108 mmol/L (ref 98–111)
Creatinine, Ser: 1.51 mg/dL — ABNORMAL HIGH (ref 0.61–1.24)
GFR, Estimated: 49 mL/min — ABNORMAL LOW (ref >60)
Glucose, Bld: 92 mg/dL (ref 70–99)
Potassium: 4.3 mmol/L (ref 3.5–5.1)
Sodium: 142 mmol/L (ref 135–145)

## 2023-03-08 LAB — CBC
HCT: 40.1 % (ref 39.0–52.0)
Hemoglobin: 12.7 g/dL — ABNORMAL LOW (ref 13.0–17.0)
MCH: 32.6 pg (ref 26.0–34.0)
MCHC: 31.7 g/dL (ref 30.0–36.0)
MCV: 102.8 fL — ABNORMAL HIGH (ref 80.0–100.0)
Platelets: 201 10*3/uL (ref 150–400)
RBC: 3.9 MIL/uL — ABNORMAL LOW (ref 4.22–5.81)
RDW: 13.5 % (ref 11.5–15.5)
WBC: 7.6 10*3/uL (ref 4.0–10.5)
nRBC: 0 % (ref 0.0–0.2)

## 2023-03-08 LAB — SURGICAL PCR SCREEN
MRSA, PCR: NEGATIVE
Staphylococcus aureus: POSITIVE — AB

## 2023-03-08 NOTE — Anesthesia Preprocedure Evaluation (Addendum)
Anesthesia Evaluation  Patient identified by MRN, date of birth, ID band Patient awake    Reviewed: Allergy & Precautions, NPO status , Patient's Chart, lab work & pertinent test results  Airway Mallampati: II   Neck ROM: Limited    Dental no notable dental hx.    Pulmonary sleep apnea and Continuous Positive Airway Pressure Ventilation    Pulmonary exam normal        Cardiovascular + CAD and + Cardiac Stents   Rhythm:Regular Rate:Normal  IMPRESSIONS   1. Left ventricular ejection fraction, by estimation, is 50% . The left  ventricle has low normal function. The left ventricle demonstrates global  hypokinesis.   2. Right ventricular systolic function is normal. The right ventricular  size is normal. There is normal pulmonary artery systolic pressure. The  estimated right ventricular systolic pressure is 35.2 mmHg.   3. The mitral valve is normal in structure. Mild mitral valve  regurgitation. No evidence of mitral stenosis.   4. The aortic valve was not well visualized. Aortic valve regurgitation  is not visualized. No aortic stenosis is present.   5. The inferior vena cava is dilated in size with <50% respiratory  variability, suggesting right atrial pressure of 15 mmHg.   6. PVCs noted  - Comparison(s): EF 60-65%, no RWMA 11/30/2018; EF 50-55% 05/27/2016; LVEF 35-40% global LV hypokinesis 10/07/2013     Neuro/Psych  Headaches  Anxiety Depression       GI/Hepatic Neg liver ROS,GERD  ,,  Endo/Other  Hypothyroidism    Renal/GU negative Renal ROS  negative genitourinary   Musculoskeletal  (+) Arthritis , Osteoarthritis,    Abdominal Normal abdominal exam  (+)   Peds  Hematology  (+) Blood dyscrasia, anemia Lab Results      Component                Value               Date                      WBC                      7.6                 03/08/2023                HGB                      12.7 (L)             03/08/2023                HCT                      40.1                03/08/2023                MCV                      102.8 (H)           03/08/2023                PLT                      201  03/08/2023              Anesthesia Other Findings   Reproductive/Obstetrics                             Anesthesia Physical Anesthesia Plan  ASA: 3  Anesthesia Plan: General   Post-op Pain Management: Tylenol PO (pre-op)*, Celebrex PO (pre-op)* and Ketamine IV*   Induction: Intravenous  PONV Risk Score and Plan: 2 and Ondansetron, Dexamethasone and Treatment may vary due to age or medical condition  Airway Management Planned: Mask, Oral ETT and Video Laryngoscope Planned  Additional Equipment: None  Intra-op Plan:   Post-operative Plan: Extubation in OR  Informed Consent: I have reviewed the patients History and Physical, chart, labs and discussed the procedure including the risks, benefits and alternatives for the proposed anesthesia with the patient or authorized representative who has indicated his/her understanding and acceptance.     Dental advisory given  Plan Discussed with: CRNA  Anesthesia Plan Comments: (PAT note written 03/08/2023 by Shonna Chock, PA-C.   )       Anesthesia Quick Evaluation

## 2023-03-08 NOTE — Progress Notes (Signed)
Anesthesia Chart Review:  Case: [id number] Date/Time: 03/13/23 0936   Procedure: ACDF C4-C5 - C5-C6 - 3C   Anesthesia type: General   Pre-op diagnosis: Stenosis   [facility] ROOM 18 / [facility] OR   Surgeons: Donalee Citrin, MD       DISCUSSION: Patient is a 53 male scheduled for the above procedure.  History includes never smoker, CAD (PCI of LAD and RCA 06/2005; EF 60-65% by Echo 2019), PVCs, LV dysfunction (last EF 50% 08/2021), dilated aortic root (3.7 cm by 08/2021 echo; not TAA by 04/25/22 CT), HLD, orthostatic hypotension (with syncope; on midodrine), Berger's disease (IgA nephropathy), CKD (stage III), hypothyroidism, anemia, OSA (s/p Inspire hypoglossal nerve stimulator 12/08/17), rosacea.    Last cardiology evaluation was on 03/07/23 with Charlsie Quest, NP for routine follow up and preoperative evaluation.  He was overall doing well from a cardiac perspective.  History of orthostatic hypotension with several falls and has been on midodrine.  He reported some occasional dizziness but no recent falls.  No chest pain, palpitations, edema. Non-ischemic stress test, EF 39% in June 2023. LVEF 50% with global hypokinesis by May 2023 echo, RVSP 25.2 mmHg, mild MR. May 2023 event monitor showed 8.4% ventricular ectopy and was evaluated by EP Dr. Lalla Brothers who recommended conservative approach as well as Florinef and midodrine for orthostatic intolerance. In regards to preoperative evaluation she wrote, "Mr. Goodrich's perioperative risk of a major cardiac event is 0.9% according to the Revised Cardiac Risk Index (RCRI).  Therefore, he is at high risk for perioperative complications.   His functional capacity is fair at 4.64 METs according to the Duke Activity Status Index (DASI). Recommendations: According to ACC/AHA guidelines, no further cardiovascular testing needed.  The patient may proceed to surgery at acceptable risk." He was previously given permission to hold Plavix for surgery.   He reported  instructions to hold aspirin and Plavix for 7 days, last dose 03/05/2023.  Anesthesia team to evaluate on the day of surgery.   VS: BP (!) 142/74   Pulse 71   Temp 36.7 C   Resp 18   Ht 6' (1.829 m)   Wt 63.1 kg   SpO2 100%   BMI 18.88 kg/m    PROVIDERS: Excell Seltzer, MD is PCP  Julien Nordmann, MD is cardiologist  Steffanie Dunn, MD is EP  Cristopher Peru, MD is neurologist Mady Haagensen, MD is nephrologist - Sleep Medicine is with [facility] in [place].    LABS: Preoperative labs noted. Creatinine 1.51 which appears consistent with prior results (Cr 1.4-1.72 since 04/25/22 in [facility]) (all labs ordered are listed, but only abnormal results are displayed)  Labs Reviewed  BASIC METABOLIC PANEL - Abnormal; Notable for the following components:      Result Value   Creatinine, Ser 1.51 (*)    GFR, Estimated 49 (*)    All other components within normal limits  CBC - Abnormal; Notable for the following components:   RBC 3.90 (*)    Hemoglobin 12.7 (*)    MCV 102.8 (*)    All other components within normal limits  SURGICAL PCR SCREEN    IMAGES: MRI C-spine 08/22/22: IMPRESSION: 1. Diffuse cervical spine spondylosis as described above. [See full report] 2. No acute osseous injury of the cervical spine.  CT Chest/abd/pelvis 04/25/22: IMPRESSION: 1. No CT evidence for acute intrathoracic, intra-abdominal, or intrapelvic abnormality. 2. Acute displaced right twelfth posterior rib fracture. 3. Emphysema with bronchiectasis and fibrosis at the  right greater than left apex. Minimal tree-in-bud density in the peripheral right upper lobe consistent with respiratory infection, potentially due to atypical organism. 4. Mild subcutaneous soft tissue stranding within the right inferior gluteal region near the coccyx, could represent a small contusion. 5. Chronic compression fractures at T7, T8, T9, L1 and L2. Treated compression fracture at T12. 6.  Aortic atherosclerosis.    EKG: 03/07/23:  Sinus rhythm with Premature atrial complexes When compared with ECG of 07-Feb-2020 14:38, Left ventricular hypertrophy Confirmed by Charlsie Quest ([id number]) on 03/07/2023 9:25:11 AM   CV: US Carotid 08/22/2022: IMPRESSION: Color duplex indicates minimal heterogeneous and calcified plaque, with no hemodynamically significant stenosis by duplex criteria in the extracranial cerebrovascular circulation.    Lexiscan MPI 09/22/2021:   The study is low risk.   There is no evidence of ischemia   Left ventricular function visually appears normal. Calculated EF is 39%. correlation with echo advised   LAD and RCA calcifications noted   Image quality degraded by diaphragmatic artifacts.   Echocardiogram 08/19/2021: IMPRESSIONS   1. Left ventricular ejection fraction, by estimation, is 50% . The left  ventricle has low normal function. The left ventricle demonstrates global  hypokinesis.   2. Right ventricular systolic function is normal. The right ventricular  size is normal. There is normal pulmonary artery systolic pressure. The  estimated right ventricular systolic pressure is 35.2 mmHg.   3. The mitral valve is normal in structure. Mild mitral valve  regurgitation. No evidence of mitral stenosis.   4. The aortic valve was not well visualized. Aortic valve regurgitation  is not visualized. No aortic stenosis is present.   5. The inferior vena cava is dilated in size with <50% respiratory  variability, suggesting right atrial pressure of 15 mmHg.   6. PVCs noted  - Comparison(s): EF 60-65%, no RWMA 11/30/2018; EF 50-55% 05/27/2016; LVEF 35-40% global LV hypokinesis 10/07/2013    Event Monitor 08/05/2021 - 08/18/2021: Normal sinus rhythm Patient had a min HR of 40 bpm, max HR of 193 bpm, and avg HR of 68 bpm.    11 Supraventricular Tachycardia runs occurred, the run with the fastest interval lasting 4 beats with a max rate of 193 bpm, the longest  lasting 15 beats with an avg rate of 111 bpm.  Junctional Rhythm was present.   IIsolated SVEs were occasional (1.7%, [id number]), SVE Couplets were rare (<1.0%, 72), and SVE Triplets were rare (<1.0%, 11). Isolated VEs were frequent (8.4%, [id number]), VE Couplets were rare (<1.0%, 8), and VE Triplets were rare (<1.0%, 1). Ventricular Bigeminy and Trigeminy were present.   Triggered events associated with PVCs    Cardiac MRI 11/04/2013: IMPRESSION:  1.  Normal LV size and systolic function, EF 59%. 2.  Normal RV size and systolic function. 3. No myocardial delayed enhancement noted, so no definitive  evidence for prior MI, myocarditis, or infiltrative disease.   Cath 09/06/2013: ANGIOGRAPHIC DATA:    - The left main coronary artery is normal. - The left anterior descending artery is widely patent. The proximal LAD stent contains distal margin 30% narrowing due to ISR.. - The left circumflex artery is widely patent. - The right coronary artery is widely patent. The mid right coronary stent is without evidence of ISR.   LEFT VENTRICULOGRAM: Left ventricular angiogram was done in the 30 RAO projection and revealed 55% without regional wall motion abnormality.   IMPRESSIONS:  1. Widely patent coronary arteries including the proximal LAD stent and mid RCA stent. There  is 25-30% in-stent restenosis within the distal margin of the LAD stent. 2. Normal LV function   RECOMMENDATION:  Medical therapy.   Past Medical History:  Diagnosis Date   Actinic keratosis 02/04/2021   right lateral neck   Anemia    Anxiety    Asymptomatic LV dysfunction    50-55% by echo 06/2016   Berger's disease    CAD (coronary artery disease) 06/2005   PCI of LAD and RCA   Depression    Dilated aortic root (HCC)    4cm at sinus of Valsalva   Hematuria    Hyperlipidemia    Hypothyroidism    Insomnia    Left ureteral stone    Orthostatic hypotension    negative tilt table   OSA (obstructive sleep apnea)     moderate with AHI 17/hr, uses CPAP nightly(01/15/19 - has Inspire implant)   PVC's (premature ventricular contractions) 07/07/2016   Renal disorder    PT IS SEEING DR. Eliott Nine- NEPHROLOGIST   Rosacea    Urticaria     Past Surgical History:  Procedure Laterality Date   CARDIAC CATHETERIZATION  09/06/2013   [facility]   CARDIAC CATHETERIZATION     COLONOSCOPY WITH PROPOFOL N/A 01/21/2019   Procedure: COLONOSCOPY WITH PROPOFOL;  Surgeon: Midge Minium, MD;  [facility];  Service: Endoscopy;  Laterality: N/A;  sleep apnea   COLONOSCOPY WITH PROPOFOL N/A 10/19/2021   Procedure: COLONOSCOPY WITH PROPOFOL;  Surgeon: Midge Minium, MD;  Location: [facility] END[facility]COPY;  Service: Endoscopy;  Laterality: N/A;   CORONARY ANGIOPLASTY  2004   STENT PLACEMENT   CYSTOSCOPY WITH RETROGRADE PYELOGRAM, URETEROSCOPY AND STENT PLACEMENT Left 03/19/2014   Procedure: CYSTOSCOPY WITH RETROGRADE PYELOGRAM, URETEROSCOPY AND STENT PLACEMENT;  Surgeon: Loletta Parish, MD;  Location: [facility];  Service: Urology;  Laterality: Left;   CYSTOSCOPY WITH RETROGRADE PYELOGRAM, URETEROSCOPY AND STENT PLACEMENT Bilateral 05/20/2015   Procedure:  CYSTOSCOPY WITH BILATERAL RETROGRADE PYELOGRAM,RIGHT  DIAGNOSTIC URETEROSCOPY ,LEFT URETEROSCOPY WITH HOLMIUM LASER  AND BILATERAL  STENT PLACEMENT ;  Surgeon: Sebastian Ache, MD;  Location: [facility];  Service: Urology;  Laterality: Bilateral;   DRUG INDUCED ENDOSCOPY N/A 10/20/2017   Procedure: DRUG INDUCED SLEEP ENDOSCOPY;  Surgeon: Christia Reading, MD;  Location: [facility];  Service: ENT;  Laterality: N/A;   EP IMPLANTABLE DEVICE N/A 05/13/2016   Procedure: Loop Recorder Insertion;  Surgeon: Duke Salvia, MD;  Location: [facility] INVASIVE CV LAB;  Service: Cardiovascular;  Laterality: N/A;   ESOPHAGOGASTRODUODENOSCOPY N/A 10/19/2021   Procedure: ESOPHAGOGASTRODUODENOSCOPY (EGD);  Surgeon: Midge Minium, MD;  Location: [facility] ENDOSCOPY;  Service: Endoscopy;  Laterality: N/A;    ESOPHAGOGASTRODUODENOSCOPY (EGD) WITH PROPOFOL N/A 11/30/2018   Procedure: ESOPHAGOGASTRODUODENOSCOPY (EGD) WITH PROPOFOL;  Surgeon: Wyline Mood, MD;  Location: [facility] ENDOSCOPY;  Service: Gastroenterology;  Laterality: N/A;   ESOPHAGOGASTRODUODENOSCOPY (EGD) WITH PROPOFOL N/A 12/01/2018   Procedure: ESOPHAGOGASTRODUODENOSCOPY (EGD) WITH PROPOFOL;  Surgeon: Midge Minium, MD;  Location: [facility] ENDOSCOPY;  Service: Endoscopy;  Laterality: N/A;   HOLMIUM LASER APPLICATION Bilateral 05/20/2015   Procedure: HOLMIUM LASER APPLICATION;  Surgeon: Sebastian Ache, MD;  Location: [facility];  Service: Urology;  Laterality: Bilateral;   IMPLIMENTATION OF A HYPOGLOSSAL NERVE STIMULATOR  12/08/2017   OSA    LEFT HEART CATHETERIZATION WITH CORONARY ANGIOGRAM N/A 09/06/2013   Procedure: LEFT HEART CATHETERIZATION WITH CORONARY ANGIOGRAM;  Surgeon: Lesleigh Noe, MD;  Location: [facility] CATH LAB;  Service: Cardiovascular;  Laterality: N/A;   LEFT KNEE CAP  SURGERY ABOUT 40  YRS AGO  ? 1970     STONE EXTRACTION WITH BASKET Left 03/19/2014   Procedure: STONE EXTRACTION WITH BASKET;  Surgeon: Loletta Parish, MD;  Location: [facility];  Service: Urology;  Laterality: Left;   TRANSURETHRAL RESECTION OF PROSTATE N/A 08/25/2021   Procedure: TRANSURETHRAL RESECTION OF THE PROSTATE (TURP);  Surgeon: Sebastian Ache, MD;  Location: [facility];  Service: Urology;  Laterality: N/A;  1 HR    MEDICATIONS:  aspirin EC 81 MG tablet   atorvastatin (LIPITOR) 80 MG tablet   buPROPion (WELLBUTRIN XL) 150 MG 24 hr tablet   busPIRone (BUSPAR) 10 MG tablet   Cholecalciferol (VITAMIN D3) 50 MCG (2000 UT) TABS   clopidogrel (PLAVIX) 75 MG tablet   cyanocobalamin (VITAMIN B12) 1000 MCG tablet   denosumab (PROLIA) 60 MG/ML SOSY injection   DULoxetine (CYMBALTA) 30 MG capsule   Elastic Bandages & Supports (ABDOMINAL BINDER/ELASTIC LARGE) MISC   ferrous sulfate 325 (65 FE) MG tablet   finasteride (PROSCAR) 5 MG tablet   gabapentin (NEURONTIN) 100 MG  capsule   [START ON 03/11/2023] HYDROcodone-acetaminophen (NORCO) 7.5-325 MG tablet   levothyroxine (SYNTHROID) 88 MCG tablet   Magnesium 250 MG CAPS   midodrine (PROAMATINE) 10 MG tablet   Multiple Vitamins-Minerals (MULTIVITAMIN WITH MINERALS) tablet   nitroGLYCERIN (NITROSTAT) 0.4 MG SL tablet   primidone (MYSOLINE) 50 MG tablet   sucralfate (CARAFATE) 1 g tablet   triamcinolone cream (KENALOG) 0.1 %   zolpidem (AMBIEN) 10 MG tablet   No current facility-administered medications for this encounter.    Shonna Chock, PA-C Surgical Short Stay/Anesthesiology [facility] Phone [phone] [facility] Phone [phone] 03/08/2023 3:10 PM

## 2023-03-08 NOTE — Progress Notes (Signed)
PCP - Dr. Kerby Nora Cardiologist - Dr. Julien Nordmann  PPM/ICD - denies Device Orders - na Rep Notified - na  Has a loop recorder and Inspire for sleep apnea  Chest x-ray - na EKG - 03/07/2023 Stress Test - 6/14/20023 ECHO - 08/19/2021 Cardiac Cath -   Sleep Study - OSA CPAP - No, Inspire   Non-diabetic  Blood Thinner Instructions: Plavix, hold 7 days prior to surgery, states last dose 03/05/2023 Aspirin Instructions: states last dose 03/05/2023  ERAS Protcol -NPO  COVID TEST- na  Anesthesia review: Yes. CAD with stents, loop recorder, CKD, heart clearance  Patient denies shortness of breath, fever, cough and chest pain at PAT appointment   All instructions explained to the patient, with a verbal understanding of the material. Patient agrees to go over the instructions while at home for a better understanding. Patient also instructed to self quarantine after being tested for COVID-19. The opportunity to ask questions was provided.

## 2023-03-13 ENCOUNTER — Other Ambulatory Visit: Payer: Self-pay

## 2023-03-13 ENCOUNTER — Inpatient Hospital Stay (HOSPITAL_COMMUNITY): Payer: Medicare Other

## 2023-03-13 ENCOUNTER — Observation Stay (HOSPITAL_COMMUNITY)
Admission: RE | Admit: 2023-03-13 | Discharge: 2023-03-14 | Disposition: A | Discharge status: Home / Self Care | Payer: Medicare Other | Attending: Neurosurgery | Admitting: Neurosurgery

## 2023-03-13 ENCOUNTER — Encounter (HOSPITAL_COMMUNITY): Payer: Self-pay | Admitting: Neurosurgery

## 2023-03-13 ENCOUNTER — Inpatient Hospital Stay (HOSPITAL_COMMUNITY): Payer: Medicare Other | Admitting: Anesthesiology

## 2023-03-13 ENCOUNTER — Ambulatory Visit (HOSPITAL_COMMUNITY)
Admission: RE | Disposition: A | Discharge status: Home / Self Care | Payer: Self-pay | Source: Home / Self Care | Attending: Neurosurgery

## 2023-03-13 ENCOUNTER — Inpatient Hospital Stay (HOSPITAL_COMMUNITY): Payer: Medicare Other | Admitting: Vascular Surgery

## 2023-03-13 DIAGNOSIS — I2510 Atherosclerotic heart disease of native coronary artery without angina pectoris: Secondary | ICD-10-CM | POA: Diagnosis not present

## 2023-03-13 DIAGNOSIS — M5412 Radiculopathy, cervical region: Secondary | ICD-10-CM | POA: Diagnosis not present

## 2023-03-13 DIAGNOSIS — Z7902 Long term (current) use of antithrombotics/antiplatelets: Secondary | ICD-10-CM | POA: Diagnosis not present

## 2023-03-13 DIAGNOSIS — Z7982 Long term (current) use of aspirin: Secondary | ICD-10-CM | POA: Diagnosis not present

## 2023-03-13 DIAGNOSIS — Z955 Presence of coronary angioplasty implant and graft: Secondary | ICD-10-CM | POA: Diagnosis not present

## 2023-03-13 DIAGNOSIS — Z981 Arthrodesis status: Secondary | ICD-10-CM | POA: Diagnosis not present

## 2023-03-13 DIAGNOSIS — M4722 Other spondylosis with radiculopathy, cervical region: Secondary | ICD-10-CM | POA: Diagnosis not present

## 2023-03-13 DIAGNOSIS — Z79899 Other long term (current) drug therapy: Secondary | ICD-10-CM | POA: Insufficient documentation

## 2023-03-13 DIAGNOSIS — M4802 Spinal stenosis, cervical region: Secondary | ICD-10-CM | POA: Diagnosis not present

## 2023-03-13 DIAGNOSIS — E039 Hypothyroidism, unspecified: Secondary | ICD-10-CM | POA: Insufficient documentation

## 2023-03-13 HISTORY — PX: ANTERIOR CERVICAL DECOMP/DISCECTOMY FUSION: SHX1161

## 2023-03-13 SURGERY — ANTERIOR CERVICAL DECOMPRESSION/DISCECTOMY FUSION 2 LEVELS
Anesthesia: General | Site: Spine Cervical

## 2023-03-13 MED ORDER — SODIUM CHLORIDE 0.9% FLUSH: 3.0000 mL | INTRAVENOUS | Status: DC | PRN

## 2023-03-13 MED ORDER — ROCURONIUM BROMIDE 10 MG/ML (PF) SYRINGE
PREFILLED_SYRINGE | INTRAVENOUS | Status: DC | PRN
  Administered 2023-03-13: 60 mg via INTRAVENOUS
  Administered 2023-03-13: 20 mg via INTRAVENOUS

## 2023-03-13 MED ORDER — CHLORHEXIDINE GLUCONATE CLOTH 2 % EX PADS
6.0000 | MEDICATED_PAD | Freq: Once | CUTANEOUS | Status: DC
Start: 2023-03-13 — End: 2023-03-13

## 2023-03-13 MED ORDER — MIDAZOLAM HCL 2 MG/2ML IJ SOLN
INTRAMUSCULAR | Status: AC
  Filled 2023-03-13: qty 2

## 2023-03-13 MED ORDER — CYCLOBENZAPRINE HCL 10 MG PO TABS
10.0000 mg | ORAL_TABLET | Freq: Three times a day (TID) | ORAL | Status: DC | PRN
  Administered 2023-03-13: 10 mg via ORAL

## 2023-03-13 MED ORDER — SODIUM CHLORIDE 0.9% FLUSH
3.0000 mL | Freq: Two times a day (BID) | INTRAVENOUS | Status: DC
  Administered 2023-03-13 (×2): 3 mL via INTRAVENOUS

## 2023-03-13 MED ORDER — PRIMIDONE 50 MG PO TABS
50.0000 mg | ORAL_TABLET | Freq: Two times a day (BID) | ORAL | Status: DC
  Administered 2023-03-13: 50 mg via ORAL
  Filled 2023-03-13 (×2): qty 1

## 2023-03-13 MED ORDER — ORAL CARE MOUTH RINSE: 15.0000 mL | Freq: Once | OROMUCOSAL | Status: AC

## 2023-03-13 MED ORDER — CELECOXIB 200 MG PO CAPS
200.0000 mg | ORAL_CAPSULE | Freq: Once | ORAL | Status: AC
  Administered 2023-03-13: 200 mg via ORAL
  Filled 2023-03-13: qty 1

## 2023-03-13 MED ORDER — ORAL CARE MOUTH RINSE: 15.0000 mL | Freq: Once | OROMUCOSAL | Status: DC

## 2023-03-13 MED ORDER — BUSPIRONE HCL 10 MG PO TABS
20.0000 mg | ORAL_TABLET | Freq: Three times a day (TID) | ORAL | Status: DC
  Administered 2023-03-13 (×2): 20 mg via ORAL
  Filled 2023-03-13 (×2): qty 2

## 2023-03-13 MED ORDER — FINASTERIDE 5 MG PO TABS: 5.0000 mg | ORAL_TABLET | Freq: Every day | ORAL | Status: DC

## 2023-03-13 MED ORDER — ACETAMINOPHEN 500 MG PO TABS
1000.0000 mg | ORAL_TABLET | Freq: Once | ORAL | Status: AC
Start: 2023-03-13 — End: 2023-03-13
  Administered 2023-03-13: 1000 mg via ORAL
  Filled 2023-03-13: qty 2

## 2023-03-13 MED ORDER — MENTHOL 3 MG MT LOZG: 1.0000 | LOZENGE | OROMUCOSAL | Status: DC | PRN

## 2023-03-13 MED ORDER — ONDANSETRON HCL 4 MG/2ML IJ SOLN: 4.0000 mg | Freq: Four times a day (QID) | INTRAMUSCULAR | Status: DC | PRN

## 2023-03-13 MED ORDER — CEFAZOLIN SODIUM-DEXTROSE 2-4 GM/100ML-% IV SOLN
2.0000 g | INTRAVENOUS | Status: AC
  Administered 2023-03-13: 2 g via INTRAVENOUS

## 2023-03-13 MED ORDER — CHLORHEXIDINE GLUCONATE 0.12 % MT SOLN
15.0000 mL | Freq: Once | OROMUCOSAL | Status: AC
  Administered 2023-03-13: 15 mL via OROMUCOSAL
  Filled 2023-03-13: qty 15

## 2023-03-13 MED ORDER — FENTANYL CITRATE (PF) 250 MCG/5ML IJ SOLN
INTRAMUSCULAR | Status: AC
  Filled 2023-03-13: qty 5

## 2023-03-13 MED ORDER — ABDOMINAL BINDER/ELASTIC LARGE MISC
1.0000 [IU] | Freq: Every day | Status: DC
Start: 2023-03-13 — End: 2023-03-13

## 2023-03-13 MED ORDER — PHENOL 1.4 % MT LIQD: 1.0000 | OROMUCOSAL | Status: DC | PRN

## 2023-03-13 MED ORDER — MIDAZOLAM HCL 2 MG/2ML IJ SOLN
INTRAMUSCULAR | Status: DC | PRN
  Administered 2023-03-13: 2 mg via INTRAVENOUS

## 2023-03-13 MED ORDER — ATORVASTATIN CALCIUM 80 MG PO TABS: 80.0000 mg | ORAL_TABLET | Freq: Every day | ORAL | Status: DC

## 2023-03-13 MED ORDER — VITAMIN B-12 1000 MCG PO TABS
1000.0000 ug | ORAL_TABLET | Freq: Every day | ORAL | Status: DC
  Administered 2023-03-13: 1000 ug via ORAL
  Filled 2023-03-13: qty 1

## 2023-03-13 MED ORDER — ONDANSETRON HCL 4 MG/2ML IJ SOLN
INTRAMUSCULAR | Status: DC | PRN
  Administered 2023-03-13: 4 mg via INTRAVENOUS

## 2023-03-13 MED ORDER — MAGNESIUM OXIDE -MG SUPPLEMENT 400 (240 MG) MG PO TABS
200.0000 mg | ORAL_TABLET | Freq: Every day | ORAL | Status: DC
  Administered 2023-03-13: 200 mg via ORAL
  Filled 2023-03-13: qty 1

## 2023-03-13 MED ORDER — SODIUM CHLORIDE 0.9 % IV SOLN
250.0000 mL | INTRAVENOUS | Status: DC
  Administered 2023-03-13: 250 mL via INTRAVENOUS

## 2023-03-13 MED ORDER — CHLORHEXIDINE GLUCONATE 0.12 % MT SOLN: 15.0000 mL | Freq: Once | OROMUCOSAL | Status: DC

## 2023-03-13 MED ORDER — VITAMIN D 25 MCG (1000 UNIT) PO TABS
2000.0000 [IU] | ORAL_TABLET | Freq: Every day | ORAL | Status: DC
  Administered 2023-03-13: 2000 [IU] via ORAL
  Filled 2023-03-13: qty 2

## 2023-03-13 MED ORDER — LIDOCAINE 2% (20 MG/ML) 5 ML SYRINGE
INTRAMUSCULAR | Status: DC | PRN
  Administered 2023-03-13: 60 mg via INTRAVENOUS

## 2023-03-13 MED ORDER — PANTOPRAZOLE SODIUM 40 MG PO TBEC
40.0000 mg | DELAYED_RELEASE_TABLET | Freq: Every day | ORAL | Status: DC
  Administered 2023-03-13: 40 mg via ORAL
  Filled 2023-03-13: qty 1

## 2023-03-13 MED ORDER — CEFAZOLIN SODIUM-DEXTROSE 2-4 GM/100ML-% IV SOLN
INTRAVENOUS | Status: AC
  Filled 2023-03-13: qty 100

## 2023-03-13 MED ORDER — ADULT MULTIVITAMIN W/MINERALS CH
1.0000 | ORAL_TABLET | Freq: Every day | ORAL | Status: DC
  Administered 2023-03-13: 1 via ORAL
  Filled 2023-03-13: qty 1

## 2023-03-13 MED ORDER — 0.9 % SODIUM CHLORIDE (POUR BTL) OPTIME
TOPICAL | Status: DC | PRN
  Administered 2023-03-13: 1000 mL

## 2023-03-13 MED ORDER — LEVOTHYROXINE SODIUM 88 MCG PO TABS
88.0000 ug | ORAL_TABLET | Freq: Every day | ORAL | Status: DC
Start: 2023-03-14 — End: 2023-03-14
  Administered 2023-03-14: 88 ug via ORAL
  Filled 2023-03-13: qty 1

## 2023-03-13 MED ORDER — ACETAMINOPHEN 325 MG PO TABS: 650.0000 mg | ORAL_TABLET | ORAL | Status: DC | PRN

## 2023-03-13 MED ORDER — FENTANYL CITRATE (PF) 100 MCG/2ML IJ SOLN
INTRAMUSCULAR | Status: AC
  Filled 2023-03-13: qty 2

## 2023-03-13 MED ORDER — HYDROCODONE-ACETAMINOPHEN 5-325 MG PO TABS
2.0000 | ORAL_TABLET | ORAL | Status: DC | PRN
  Administered 2023-03-13 – 2023-03-14 (×3): 2 via ORAL
  Filled 2023-03-13 (×3): qty 2

## 2023-03-13 MED ORDER — ASPIRIN 81 MG PO TBEC: 81.0000 mg | DELAYED_RELEASE_TABLET | Freq: Every day | ORAL | Status: DC

## 2023-03-13 MED ORDER — FERROUS SULFATE 325 (65 FE) MG PO TABS
325.0000 mg | ORAL_TABLET | Freq: Every day | ORAL | Status: DC
  Administered 2023-03-14: 325 mg via ORAL
  Filled 2023-03-13: qty 1

## 2023-03-13 MED ORDER — TRIAMCINOLONE ACETONIDE 0.1 % EX CREA
TOPICAL_CREAM | Freq: Two times a day (BID) | CUTANEOUS | Status: DC
  Filled 2023-03-13: qty 15

## 2023-03-13 MED ORDER — THROMBIN 5000 UNITS EX SOLR
CUTANEOUS | Status: AC
  Filled 2023-03-13: qty 15000

## 2023-03-13 MED ORDER — PROPOFOL 10 MG/ML IV BOLUS
INTRAVENOUS | Status: DC | PRN
  Administered 2023-03-13: 130 mg via INTRAVENOUS

## 2023-03-13 MED ORDER — ONDANSETRON HCL 4 MG PO TABS
4.0000 mg | ORAL_TABLET | Freq: Four times a day (QID) | ORAL | Status: DC | PRN
  Administered 2023-03-14: 4 mg via ORAL
  Filled 2023-03-13: qty 1

## 2023-03-13 MED ORDER — THROMBIN (RECOMBINANT) 5000 UNITS EX SOLR: CUTANEOUS | Status: DC | PRN

## 2023-03-13 MED ORDER — BUPROPION HCL ER (XL) 300 MG PO TB24
300.0000 mg | ORAL_TABLET | Freq: Every day | ORAL | Status: DC
  Administered 2023-03-13: 300 mg via ORAL
  Filled 2023-03-13: qty 1

## 2023-03-13 MED ORDER — PROPOFOL 10 MG/ML IV BOLUS
INTRAVENOUS | Status: AC
  Filled 2023-03-13: qty 20

## 2023-03-13 MED ORDER — PANTOPRAZOLE SODIUM 40 MG IV SOLR: 40.0000 mg | Freq: Every day | INTRAVENOUS | Status: DC

## 2023-03-13 MED ORDER — CEFAZOLIN SODIUM-DEXTROSE 2-4 GM/100ML-% IV SOLN
2.0000 g | Freq: Three times a day (TID) | INTRAVENOUS | Status: AC
Start: 2023-03-13 — End: 2023-03-14
  Administered 2023-03-13 – 2023-03-14 (×2): 2 g via INTRAVENOUS
  Filled 2023-03-13 (×2): qty 100

## 2023-03-13 MED ORDER — THROMBIN 5000 UNITS EX SOLR: OROMUCOSAL | Status: DC | PRN

## 2023-03-13 MED ORDER — ZOLPIDEM TARTRATE 5 MG PO TABS
10.0000 mg | ORAL_TABLET | Freq: Every day | ORAL | Status: DC
Start: 2023-03-13 — End: 2023-03-14
  Administered 2023-03-13: 10 mg via ORAL
  Filled 2023-03-13: qty 2

## 2023-03-13 MED ORDER — FENTANYL CITRATE (PF) 250 MCG/5ML IJ SOLN
INTRAMUSCULAR | Status: DC | PRN
  Administered 2023-03-13: 100 ug via INTRAVENOUS
  Administered 2023-03-13 (×3): 50 ug via INTRAVENOUS

## 2023-03-13 MED ORDER — SUGAMMADEX SODIUM 200 MG/2ML IV SOLN
INTRAVENOUS | Status: DC | PRN
  Administered 2023-03-13: 126 mg via INTRAVENOUS

## 2023-03-13 MED ORDER — FENTANYL CITRATE (PF) 100 MCG/2ML IJ SOLN
25.0000 ug | INTRAMUSCULAR | Status: DC | PRN
  Administered 2023-03-13 (×3): 50 ug via INTRAVENOUS

## 2023-03-13 MED ORDER — DULOXETINE HCL 30 MG PO CPEP
30.0000 mg | ORAL_CAPSULE | Freq: Every day | ORAL | Status: DC
  Administered 2023-03-13: 30 mg via ORAL
  Filled 2023-03-13: qty 1

## 2023-03-13 MED ORDER — ACETAMINOPHEN 650 MG RE SUPP: 650.0000 mg | RECTAL | Status: DC | PRN

## 2023-03-13 MED ORDER — CYCLOBENZAPRINE HCL 10 MG PO TABS
ORAL_TABLET | ORAL | Status: AC
  Filled 2023-03-13: qty 1

## 2023-03-13 MED ORDER — NITROGLYCERIN 0.4 MG SL SUBL: 0.4000 mg | SUBLINGUAL_TABLET | SUBLINGUAL | Status: DC | PRN

## 2023-03-13 MED ORDER — ALUM & MAG HYDROXIDE-SIMETH 200-200-20 MG/5ML PO SUSP: 30.0000 mL | Freq: Four times a day (QID) | ORAL | Status: DC | PRN

## 2023-03-13 MED ORDER — HYDROMORPHONE HCL 1 MG/ML IJ SOLN
0.5000 mg | INTRAMUSCULAR | Status: DC | PRN
  Administered 2023-03-13: 0.5 mg via INTRAVENOUS
  Filled 2023-03-13: qty 0.5

## 2023-03-13 MED ORDER — LACTATED RINGERS IV SOLN: INTRAVENOUS | Status: DC

## 2023-03-13 MED ORDER — GABAPENTIN 100 MG PO CAPS
100.0000 mg | ORAL_CAPSULE | Freq: Three times a day (TID) | ORAL | Status: DC
  Administered 2023-03-13 (×2): 100 mg via ORAL
  Filled 2023-03-13 (×2): qty 1

## 2023-03-13 MED ORDER — DEXAMETHASONE SODIUM PHOSPHATE 10 MG/ML IJ SOLN
INTRAMUSCULAR | Status: DC | PRN
  Administered 2023-03-13: 10 mg via INTRAVENOUS

## 2023-03-13 MED ORDER — MIDODRINE HCL 5 MG PO TABS
10.0000 mg | ORAL_TABLET | Freq: Three times a day (TID) | ORAL | Status: DC
  Administered 2023-03-13: 10 mg via ORAL
  Filled 2023-03-13 (×3): qty 2

## 2023-03-13 SURGICAL SUPPLY — 50 items
BAG COUNTER SPONGE SURGICOUNT (BAG) ×1 IMPLANT
BASKET BONE COLLECTION (BASKET) ×1 IMPLANT
BENZOIN TINCTURE PRP APPL 2/3 (GAUZE/BANDAGES/DRESSINGS) ×1 IMPLANT
BIT DRILL NEURO 2X3.1 SFT TUCH (MISCELLANEOUS) ×1 IMPLANT
BONE VIVIGEN FORMABLE 1.3CC (Bone Implant) ×1 IMPLANT
BUR MATCHSTICK NEURO 3.0 LAGG (BURR) ×1 IMPLANT
CANISTER SUCT 3000ML PPV (MISCELLANEOUS) ×1 IMPLANT
DERMABOND ADVANCED .7 DNX12 (GAUZE/BANDAGES/DRESSINGS) IMPLANT
DRAPE C-ARM 42X72 X-RAY (DRAPES) ×2 IMPLANT
DRAPE LAPAROTOMY 100X72 PEDS (DRAPES) ×1 IMPLANT
DRAPE MICROSCOPE SLANT 54X150 (MISCELLANEOUS) ×1 IMPLANT
DRILL NEURO 2X3.1 SOFT TOUCH (MISCELLANEOUS) ×1 IMPLANT
DRSG OPSITE POSTOP 4X6 (GAUZE/BANDAGES/DRESSINGS) IMPLANT
DURAPREP 6ML APPLICATOR 50/CS (WOUND CARE) ×1 IMPLANT
ELECT COATED BLADE 2.86 ST (ELECTRODE) ×1 IMPLANT
ELECT REM PT RETURN 9FT ADLT (ELECTROSURGICAL) ×1 IMPLANT
ELECTRODE REM PT RTRN 9FT ADLT (ELECTROSURGICAL) ×1 IMPLANT
GAUZE 4X4 16PLY ~~LOC~~+RFID DBL (SPONGE) IMPLANT
GAUZE SPONGE 4X4 12PLY STRL (GAUZE/BANDAGES/DRESSINGS) ×1 IMPLANT
GLOVE BIO SURGEON STRL SZ7 (GLOVE) IMPLANT
GLOVE BIO SURGEON STRL SZ8 (GLOVE) ×1 IMPLANT
GLOVE BIOGEL PI IND STRL 7.0 (GLOVE) IMPLANT
GLOVE INDICATOR 8.5 STRL (GLOVE) ×2 IMPLANT
GOWN STRL REUS W/ TWL LRG LVL3 (GOWN DISPOSABLE) IMPLANT
GOWN STRL REUS W/ TWL XL LVL3 (GOWN DISPOSABLE) ×1 IMPLANT
GOWN STRL REUS W/TWL 2XL LVL3 (GOWN DISPOSABLE) IMPLANT
GRAFT BNE MATRIX VG FRMBL SM 1 (Bone Implant) IMPLANT
HALTER HD/CHIN CERV TRACTION D (MISCELLANEOUS) ×1 IMPLANT
HEMOSTAT POWDER KIT SURGIFOAM (HEMOSTASIS) ×1 IMPLANT
KIT BASIN OR (CUSTOM PROCEDURE TRAY) ×1 IMPLANT
KIT TURNOVER KIT B (KITS) ×1 IMPLANT
NDL SPNL 20GX3.5 QUINCKE YW (NEEDLE) ×1 IMPLANT
NEEDLE SPNL 20GX3.5 QUINCKE YW (NEEDLE) ×1 IMPLANT
NS IRRIG 1000ML POUR BTL (IV SOLUTION) ×1 IMPLANT
PACK LAMINECTOMY NEURO (CUSTOM PROCEDURE TRAY) ×1 IMPLANT
PAD ARMBOARD 7.5X6 YLW CONV (MISCELLANEOUS) ×3 IMPLANT
PIN DISTRACTION 14MM (PIN) IMPLANT
PLATE CERV RESONATE 28 2LVL (Plate) IMPLANT
SCREW VA SD 4.2X15 (Screw) IMPLANT
SCREW VA SD 4.2X16 (Screw) IMPLANT
SPACER HEDRON 14X16X6 0D (Spacer) IMPLANT
SPONGE INTESTINAL PEANUT (DISPOSABLE) ×1 IMPLANT
SPONGE SURGIFOAM ABS GEL SZ50 (HEMOSTASIS) ×1 IMPLANT
STRIP CLOSURE SKIN 1/2X4 (GAUZE/BANDAGES/DRESSINGS) ×1 IMPLANT
SUT VIC AB 3-0 SH 8-18 (SUTURE) ×1 IMPLANT
SUT VICRYL 4-0 PS2 18IN ABS (SUTURE) ×1 IMPLANT
TAPE CLOTH 4X10 WHT NS (GAUZE/BANDAGES/DRESSINGS) ×1 IMPLANT
TOWEL GREEN STERILE (TOWEL DISPOSABLE) ×1 IMPLANT
TOWEL GREEN STERILE FF (TOWEL DISPOSABLE) ×1 IMPLANT
WATER STERILE IRR 1000ML POUR (IV SOLUTION) ×1 IMPLANT

## 2023-03-13 NOTE — Op Note (Signed)
Preoperative diagnosis: Cervical spondylosis with radiculopathy for 5 C5-6  Postoperative diagnosis: Same  Procedure: Anterior cervical discectomies and fusion at C4-5 C5-6 utilizing globus titanium cages packed with locally harvested autograft mixed with Vivigen and anterior cervical plating utilizing the globus resonate plating system.  Surgeon: Donalee Citrin.  Assistant: Julien Girt.  Anesthesia: General.  EBL: Minimal.  HPI: 72 year old gentleman with neck and left shoulder pain progressive worsening refractory to all forms of conservative treatment.  Workup revealed severe cervical spondylosis with cord compression and foraminal stenosis at C4-5 and C5-6.  Due to the patient's progression of clinical syndrome imaging findings and failed conservative treatment I recommended anterior cervical discectomies and fusion at those levels.  I extensively reviewed the risks and benefits of the operation with the patient as well as perioperative course expectations of outcome and alternatives to surgery and he understood and agreed to proceed forward.  Operative procedure: Patient was brought into the OR was induced under general anesthesia positioned supine the neck in slight extension 5 pounds of halter traction.  The right side of his neck was prepped and draped in routine sterile fashion.  Preoperative x-ray localized the appropriate level.  So a curvilinear incision was made just off midline to the intraportal of the sternocleidomastoid and superficial abscess was dissected and divided longitudinally.  The avascular plane between the sternomastoid and strap was developed down to the prevertebral fascia and prevertebral fascia was dissected away with Kitners.  Interoperative x-ray confirmed identification appropriate level.  Anterior osteophytes and disc space was incised and removed longus was reflected laterally and self-retaining retractors were placed.  Both disc bases were drilled down The Bone  Shavings and Mucus Trap.  Under Microscopic Illumination Further Drilling down to the C4-5 Allowed Defecation a Large Posterior Spur Coming off the C4 Vertebral Body Causing Severe Cord Compression This Was All Removed Posterior Last 2 to Ligament Was Removed Decompressing Central Canal and Marched Laterally Both C5 Pedicles Were Identified and Both C5 Nerve Roots Were Decompressed and Skeletonized Flush with the Pedicle.  This Was Then Packed with Gelfoam Attention Taken at C5-6.  In a Similar Fashion C5-6 Was Drilled down with a Large Posterior Spur Coming off the C5 Vertebral Body Both C6 Pedicles Were Identified Both C6 Nerve Roots Were Decompressed and As Well As under Biting Both Endplates Decompress the Central Canal at This Level As Well.  At the End of Discectomy There Is No Further Stenosis Either Centrally or Foraminally I Then Sized up to 6 Mm Titanium Cages Packed with Locally Harvested Autograft Mixed with Vivigen and Inserted Inserted Some Additional Bone Graft Material Laterally to the Cage and Anterior I Selected a 28 Mm Globus Resonate Plate All Screws Had Excellent Purchase and Postop Imaging Confirmed Good Position of the Implant and Locking Mechanisms Were Engaged.  The Wound Was Then Copiously Irrigated Meticulous Hemostasis Was Maintained the Wound Was Then Closed in Layers with Interrupted Vicryl and a Running 4 Subcuticular.  Dermabond Benzoin Steri-Strips and a Sterile Dressing Was Applied Patient Recovery in Stable Condition.  At the End the Case All Needle Count Sponge Counts Were Correct.

## 2023-03-13 NOTE — Transfer of Care (Signed)
Immediate Anesthesia Transfer of Care Note  Patient: Shawn Lam  Procedure(s) Performed: CERVICAL FOUR-FIVE, CERVICAL FIVE-SIX ANTERIOR CERVICAL DISCECTOMY/DECOMPRESSION FUSION (Spine Cervical)  Patient Location: PACU  Anesthesia Type:General  Level of Consciousness: awake  Airway & Oxygen Therapy: Patient Spontanous Breathing  Post-op Assessment: Post -op Vital signs reviewed and stable  Post vital signs: stable  Last Vitals:  Vitals Value Taken Time  BP 177/84 03/13/23 1219  Temp    Pulse 74 03/13/23 1223  Resp 16 03/13/23 1223  SpO2 99 % 03/13/23 1223  Vitals shown include unfiled device data.  Last Pain:  Vitals:   03/13/23 0812  TempSrc: Oral  PainSc: 9       Patients Stated Pain Goal: 5 (03/13/23 4401)  Complications: There were no known notable events for this encounter.

## 2023-03-13 NOTE — Plan of Care (Signed)
  Problem: Education: Goal: Knowledge of General Education information will improve Description: Including pain rating scale, medication(s)/side effects and non-pharmacologic comfort measures Outcome: Progressing   Problem: Health Behavior/Discharge Planning: Goal: Ability to manage health-related needs will improve Outcome: Progressing   Problem: Clinical Measurements: Goal: Ability to maintain clinical measurements within normal limits will improve Outcome: Progressing Goal: Will remain free from infection Outcome: Progressing Goal: Diagnostic test results will improve Outcome: Progressing Goal: Respiratory complications will improve Outcome: Progressing Goal: Cardiovascular complication will be avoided Outcome: Progressing   Problem: Activity: Goal: Risk for activity intolerance will decrease Outcome: Progressing   Problem: Nutrition: Goal: Adequate nutrition will be maintained Outcome: Progressing   Problem: Coping: Goal: Level of anxiety will decrease Outcome: Progressing   Problem: Elimination: Goal: Will not experience complications related to bowel motility Outcome: Progressing Goal: Will not experience complications related to urinary retention Outcome: Progressing   Problem: Pain Management: Goal: General experience of comfort will improve Outcome: Progressing   Problem: Safety: Goal: Ability to remain free from injury will improve Outcome: Progressing   Problem: Skin Integrity: Goal: Risk for impaired skin integrity will decrease Outcome: Progressing   Problem: Education: Goal: Ability to verbalize activity precautions or restrictions will improve Outcome: Progressing Goal: Knowledge of the prescribed therapeutic regimen will improve Outcome: Progressing Goal: Understanding of discharge needs will improve Outcome: Progressing   Problem: Activity: Goal: Ability to avoid complications of mobility impairment will improve Outcome: Progressing Goal:  Ability to tolerate increased activity will improve Outcome: Progressing Goal: Will remain free from falls Outcome: Progressing   Problem: Bowel/Gastric: Goal: Gastrointestinal status for postoperative course will improve Outcome: Progressing   Problem: Clinical Measurements: Goal: Ability to maintain clinical measurements within normal limits will improve Outcome: Progressing Goal: Postoperative complications will be avoided or minimized Outcome: Progressing Goal: Diagnostic test results will improve Outcome: Progressing   Problem: Pain Management: Goal: Pain level will decrease Outcome: Progressing   Problem: Skin Integrity: Goal: Will show signs of wound healing Outcome: Progressing   Problem: Health Behavior/Discharge Planning: Goal: Identification of resources available to assist in meeting health care needs will improve Outcome: Progressing   Problem: Bladder/Genitourinary: Goal: Urinary functional status for postoperative course will improve Outcome: Progressing

## 2023-03-13 NOTE — Anesthesia Procedure Notes (Signed)
Procedure Name: Intubation Date/Time: 03/13/2023 10:00 AM  Performed by: Hessie Diener, CRNAPre-anesthesia Checklist: Patient identified, Emergency Drugs available, Suction available and Patient being monitored Patient Re-evaluated:Patient Re-evaluated prior to induction Oxygen Delivery Method: Circle System Utilized Preoxygenation: Pre-oxygenation with 100% oxygen Induction Type: IV induction Ventilation: Mask ventilation without difficulty Laryngoscope Size: Glidescope and 3 Grade View: Grade I Tube type: Oral Tube size: 7.5 mm Number of attempts: 1 Airway Equipment and Method: Stylet and Oral airway Placement Confirmation: ETT inserted through vocal cords under direct vision, positive ETCO2 and breath sounds checked- equal and bilateral Secured at: 23 cm Tube secured with: Tape Dental Injury: Teeth and Oropharynx as per pre-operative assessment

## 2023-03-13 NOTE — Progress Notes (Signed)
Doing well, appropriate neck soreness. Plan to d/c home in am

## 2023-03-13 NOTE — Anesthesia Postprocedure Evaluation (Addendum)
Anesthesia Post Note  Patient: Royce Macadamia  Procedure(s) Performed: CERVICAL FOUR-FIVE, CERVICAL FIVE-SIX ANTERIOR CERVICAL DISCECTOMY/DECOMPRESSION FUSION (Spine Cervical)     Patient location during evaluation: PACU Anesthesia Type: General Level of consciousness: awake and alert Pain management: pain level controlled Vital Signs Assessment: post-procedure vital signs reviewed and stable Respiratory status: spontaneous breathing, nonlabored ventilation, respiratory function stable and patient connected to nasal cannula oxygen Cardiovascular status: blood pressure returned to baseline and stable Postop Assessment: no apparent nausea or vomiting Anesthetic complications: no  There were no known notable events for this encounter.  Last Vitals:  Vitals:   03/13/23 1315 03/13/23 1344  BP: (!) 148/83 (!) 160/84  Pulse: 68 69  Resp: 17 20  Temp: 36.8 C 36.7 C  SpO2: 97% 100%                Shelton Silvas

## 2023-03-13 NOTE — H&P (Signed)
Shawn Lam is an 72 y.o. male.   Chief Complaint: Neck and left shoulder pain HPI: 72 year old gentleman with left-sided neck and shoulder pain this been refractory to all forms of conservative treatment.  Workup revealed cervical spondylosis with stenosis C4-5 C5-6.  Due to the patient's progression of clinical syndrome imaging findings and failed conservative treatment I recommended anterior cervical discectomies and fusion at those 2 levels.  I extensively reviewed the risks and benefits of the operation with the patient as well as perioperative course expectations of outcome and alternatives to surgery and he understood and agreed to proceed forward.  Past Medical History:  Diagnosis Date   Actinic keratosis 02/04/2021   right lateral neck   Anemia    Anxiety    Asymptomatic LV dysfunction    50-55% by echo 06/2016   Berger's disease    CAD (coronary artery disease) 06/2005   PCI of LAD and RCA   Depression    Dilated aortic root (HCC)    4cm at sinus of Valsalva   Hematuria    Hyperlipidemia    Hypothyroidism    Insomnia    Left ureteral stone    Orthostatic hypotension    negative tilt table   OSA (obstructive sleep apnea)    moderate with AHI 17/hr, uses CPAP nightly(01/15/19 - has Inspire implant)   PVC's (premature ventricular contractions) 07/07/2016   Renal disorder    PT IS SEEING DR. Eliott Nine- NEPHROLOGIST   Rosacea    Urticaria     Past Surgical History:  Procedure Laterality Date   CARDIAC CATHETERIZATION  09/06/2013   [facility]   CARDIAC CATHETERIZATION     COLONOSCOPY WITH PROPOFOL N/A 01/21/2019   Procedure: COLONOSCOPY WITH PROPOFOL;  Surgeon: Midge Minium, MD;  [facility];  Service: Endoscopy;  Laterality: N/A;  sleep apnea   COLONOSCOPY WITH PROPOFOL N/A 10/19/2021   Procedure: COLONOSCOPY WITH PROPOFOL;  Surgeon: Midge Minium, MD;  Location: [facility] ENDOSCOPY;  Service: Endoscopy;  Laterality: N/A;   CORONARY ANGIOPLASTY  2004   STENT  PLACEMENT   CYSTOSCOPY WITH RETROGRADE PYELOGRAM, URETEROSCOPY AND STENT PLACEMENT Left 03/19/2014   Procedure: CYSTOSCOPY WITH RETROGRADE PYELOGRAM, URETEROSCOPY AND STENT PLACEMENT;  Surgeon: Loletta Parish, MD;  Location: [facility];  Service: Urology;  Laterality: Left;   CYSTOSCOPY WITH RETROGRADE PYELOGRAM, URETEROSCOPY AND STENT PLACEMENT Bilateral 05/20/2015   Procedure:  CYSTOSCOPY WITH BILATERAL RETROGRADE PYELOGRAM,RIGHT  DIAGNOSTIC URETEROSCOPY ,LEFT URETEROSCOPY WITH HOLMIUM LASER  AND BILATERAL  STENT PLACEMENT ;  Surgeon: Sebastian Ache, MD;  Location: [facility];  Service: Urology;  Laterality: Bilateral;   DRUG INDUCED ENDOSCOPY N/A 10/20/2017   Procedure: DRUG INDUCED SLEEP ENDOSCOPY;  Surgeon: Christia Reading, MD;  Location: [facility];  Service: ENT;  Laterality: N/A;   EP IMPLANTABLE DEVICE N/A 05/13/2016   Procedure: Loop Recorder Insertion;  Surgeon: Duke Salvia, MD;  Location: [facility];  Service: Cardiovascular;  Laterality: N/A;   ESOPHAGOGASTRODUODENOSCOPY N/A 10/19/2021   Procedure: ESOPHAGOGASTRODUODENOSCOPY (EGD);  Surgeon: Midge Minium, MD;  Location: [facility] ENDOSCOPY;  Service: Endoscopy;  Laterality: N/A;   ESOPHAGOGASTRODUODENOSCOPY (EGD) WITH PROPOFOL N/A 11/30/2018   Procedure: ESOPHAGOGASTRODUODENOSCOPY (EGD) WITH PROPOFOL;  Surgeon: Wyline Mood, MD;  Location: [facility] ENDOSCOPY;  Service: Gastroenterology;  Laterality: N/A;   ESOPHAGOGASTRODUODENOSCOPY (EGD) WITH PROPOFOL N/A 12/01/2018   Procedure: ESOPHAGOGASTRODUODENOSCOPY (EGD) WITH PROPOFOL;  Surgeon: Midge Minium, MD;  Location: [facility] ENDOSCOPY;  Service: Endoscopy;  Laterality: N/A;   HOLMIUM LASER APPLICATION Bilateral 05/20/2015  Procedure: HOLMIUM LASER APPLICATION;  Surgeon: Sebastian Ache, MD;  Location: [facility];  Service: Urology;  Laterality: Bilateral;   IMPLIMENTATION OF A HYPOGLOSSAL NERVE STIMULATOR  12/08/2017   OSA    LEFT HEART CATHETERIZATION WITH CORONARY ANGIOGRAM N/A 09/06/2013    Procedure: LEFT HEART CATHETERIZATION WITH CORONARY ANGIOGRAM;  Surgeon: Lesleigh Noe, MD;  Location: [facility] CATH LAB;  Service: Cardiovascular;  Laterality: N/A;   LEFT KNEE CAP  SURGERY ABOUT 40 YRS AGO  ? 1970     STONE EXTRACTION WITH BASKET Left 03/19/2014   Procedure: STONE EXTRACTION WITH BASKET;  Surgeon: Loletta Parish, MD;  Location: [facility];  Service: Urology;  Laterality: Left;   TRANSURETHRAL RESECTION OF PROSTATE N/A 08/25/2021   Procedure: TRANSURETHRAL RESECTION OF THE PROSTATE (TURP);  Surgeon: Sebastian Ache, MD;  Location: [facility];  Service: Urology;  Laterality: N/A;  1 HR    Family History  Problem Relation Age of Onset   Coronary artery disease Mother    Heart attack Mother    Heart disease Mother    Hypertension Mother    Cancer Father        lung and colon   Lung cancer Father        smoker   Diabetes Brother    Social History:  reports that he has never smoked. He has never used smokeless tobacco. He reports that he does not currently use alcohol after a past usage of about 1.0 standard drink of alcohol per week. He reports that he does not use drugs.  Allergies:  Allergies  Allergen Reactions   Codeine Nausea Only and Nausea And Vomiting   Tape Other (See Comments)    Use only paper tape. Severe blistering and bruising with other tapes. No cloth tape.    Medications Prior to Admission  Medication Sig Dispense Refill   aspirin EC 81 MG tablet Take 81 mg by mouth daily.     atorvastatin (LIPITOR) 80 MG tablet Take 1 tablet (80 mg total) by mouth daily. 90 tablet 3   buPROPion (WELLBUTRIN XL) 150 MG 24 hr tablet TAKE 2 TABLETS BY MOUTH DAILY 60 tablet 2   busPIRone (BUSPAR) 10 MG tablet TAKE 1 TABLET BY MOUTH 3 TIMES A DAY (Patient taking differently: Take 20 mg by mouth 3 (three) times daily.) 270 tablet 1   Cholecalciferol (VITAMIN D3) 50 MCG (2000 UT) TABS Take 2,000 Units by mouth daily.     clopidogrel (PLAVIX) 75 MG tablet Take 1 tablet (75 mg  total) by mouth daily. 90 tablet 3   cyanocobalamin (VITAMIN B12) 1000 MCG tablet Take 1,000 mcg by mouth daily.     ferrous sulfate 325 (65 FE) MG tablet Take 325 mg by mouth daily with breakfast.     finasteride (PROSCAR) 5 MG tablet Take 5 mg by mouth daily.     HYDROcodone-acetaminophen (NORCO) 7.5-325 MG tablet Take 1 tablet by mouth every 6 (six) hours as needed for moderate pain (pain score 4-6) or severe pain (pain score 7-10).     levothyroxine (SYNTHROID) 88 MCG tablet TAKE 1 TABLET BY MOUTH DAILY BEFORE BREAKFAST 90 tablet 0   Magnesium 250 MG CAPS Take 250 mg by mouth daily.     midodrine (PROAMATINE) 10 MG tablet Take 1 tablet (10 mg total) by mouth 3 (three) times daily. 270 tablet 3   Multiple Vitamins-Minerals (MULTIVITAMIN WITH MINERALS) tablet Take 1 tablet by mouth daily.     nitroGLYCERIN (NITROSTAT) 0.4 MG SL  tablet Place 1 tablet (0.4 mg total) under the tongue every 5 (five) minutes as needed for chest pain. 10 tablet 1   primidone (MYSOLINE) 50 MG tablet Take 50 mg by mouth 2 (two) times daily.     sucralfate (CARAFATE) 1 g tablet TAKE 1 TABLET BY MOUTH 4 TIMES A DAY WITH MEALS AND AT BEDTIME 120 tablet 2   triamcinolone cream (KENALOG) 0.1 % APPLY TOPICALLY TWO TIMES A DAY AS NEEDED .DO NOT APPLY TO FACE, GROIN, UNDERAMS 454 g 0   zolpidem (AMBIEN) 10 MG tablet TAKE 1 TABLET BY MOUTH EVERY NIGHT AT BEDTIME AS NEEDED (Patient taking differently: Take 10 mg by mouth at bedtime.) 30 tablet 0   denosumab (PROLIA) 60 MG/ML SOSY injection Inject 60 mg into the skin every 6 (six) months. Do not send RX, order this from the office     DULoxetine (CYMBALTA) 30 MG capsule TAKE THREE CAPSULES BY MOUTH DAILY (Patient not taking: Reported on 03/03/2023) 90 capsule 5   Elastic Bandages & Supports (ABDOMINAL BINDER/ELASTIC LARGE) MISC 1 Units by Does not apply route daily. 1 each 1   gabapentin (NEURONTIN) 100 MG capsule TAKE FOUR CAPSULES BY MOUTH EVERY MORNING, 2 CAPSULES AT NOON, TAKE 2  CAPSULES MID-AFTERNOON AND TAKE FOUR CAPSULES AT NIGHT. (Patient not taking: Reported on 03/07/2023) 360 capsule 1    No results found. However, due to the size of the patient record, not all encounters were searched. Please check Results Review for a complete set of results. No results found.  Review of Systems  Musculoskeletal:  Positive for neck pain.    Blood pressure (!) 167/87, pulse 70, temperature 97.7 F (36.5 C), temperature source Oral, resp. rate 16, height 6' (1.829 m), weight 63 kg, SpO2 100%. Physical Exam HENT:     Head: Normocephalic.     Right Ear: Tympanic membrane normal.     Nose: Nose normal.     Mouth/Throat:     Mouth: Mucous membranes are moist.  Eyes:     Pupils: Pupils are equal, round, and reactive to light.  Cardiovascular:     Rate and Rhythm: Normal rate.  Pulmonary:     Effort: Pulmonary effort is normal.  Abdominal:     General: Abdomen is flat.  Musculoskeletal:        General: Normal range of motion.     Cervical back: Normal range of motion.  Skin:    General: Skin is warm.  Neurological:     General: No focal deficit present.     Mental Status: He is alert.     Comments: Strength 5 out of 5 deltoid, tricep, bicep, wrist flexion, wrist extension, hand intrinsics.      Assessment/Plan 72 year old presents for anterior cervical discectomies and fusion at C4-5 and C5-6  Mariam Dollar, MD 03/13/2023, 8:39 AM

## 2023-03-14 ENCOUNTER — Encounter (HOSPITAL_COMMUNITY): Payer: Self-pay | Admitting: Neurosurgery

## 2023-03-14 DIAGNOSIS — Z7982 Long term (current) use of aspirin: Secondary | ICD-10-CM | POA: Diagnosis not present

## 2023-03-14 DIAGNOSIS — M4722 Other spondylosis with radiculopathy, cervical region: Secondary | ICD-10-CM | POA: Diagnosis not present

## 2023-03-14 DIAGNOSIS — M4802 Spinal stenosis, cervical region: Secondary | ICD-10-CM | POA: Diagnosis not present

## 2023-03-14 DIAGNOSIS — I2510 Atherosclerotic heart disease of native coronary artery without angina pectoris: Secondary | ICD-10-CM | POA: Diagnosis not present

## 2023-03-14 DIAGNOSIS — E039 Hypothyroidism, unspecified: Secondary | ICD-10-CM | POA: Diagnosis not present

## 2023-03-14 DIAGNOSIS — Z7902 Long term (current) use of antithrombotics/antiplatelets: Secondary | ICD-10-CM | POA: Diagnosis not present

## 2023-03-14 MED ORDER — METHOCARBAMOL 750 MG PO TABS: 750.0000 mg | ORAL_TABLET | Freq: Four times a day (QID) | ORAL | 0 refills | Status: DC

## 2023-03-14 MED ORDER — HYDROCODONE-ACETAMINOPHEN 5-325 MG PO TABS: 2.0000 | ORAL_TABLET | ORAL | 0 refills | Status: DC | PRN

## 2023-03-14 NOTE — Care Management CC44 (Signed)
Condition Code 44 Documentation Completed  Patient Details  Name: Shawn Lam MRN: [id number] Date of Birth: 01/16/1951   Condition Code 44 given:  Yes Patient signature on Condition Code 44 notice:  Yes Documentation of 2 MD's agreement:  Yes Code 44 added to claim:  Yes    Kermit Balo, RN 03/14/2023, 8:44 AM

## 2023-03-14 NOTE — Plan of Care (Signed)

## 2023-03-14 NOTE — Care Management Obs Status (Signed)
MEDICARE OBSERVATION STATUS NOTIFICATION   Patient Details  Name: Shawn Lam MRN: [id number] Date of Birth: 1950/08/17   Medicare Observation Status Notification Given:  Yes    Kermit Balo, RN 03/14/2023, 8:44 AM

## 2023-03-14 NOTE — Evaluation (Signed)
Occupational Therapy Evaluation Patient Details Name: Shawn Lam MRN: [id number] DOB: October 26, 1950 Today's Date: 03/14/2023   History of Present Illness Pt is a 72 y/o M s/p ACDF C4-5, C5-6. PMH includes anemia, anxiety, Berger's disease, CAD, depression, HLD, hypothyroidism   Clinical Impression   Pt ind at baseline with ADLs/functional mobility, lives with spouse who can assist at d/c. Pt currently needing set up - supervision for ADLs, ind with bed mobility and supervision for transfers without AD. Pt educated on collar wear, cervical precautions (handout provided), and compensatory strategies for ADLs/mobility which pt able to demo during session. Pt presenting with impairments listed below, however has no acute OT needs at this time. Will s/o, please reconsult if there is a change in pt status. Anticipate no OT follow up needs at d/c.        If plan is discharge home, recommend the following: A little help with bathing/dressing/bathroom;Assist for transportation;Assistance with cooking/housework    Functional Status Assessment  Patient has had a recent decline in their functional status and demonstrates the ability to make significant improvements in function in a reasonable and predictable amount of time.  Equipment Recommendations  None recommended by OT    Recommendations for Other Services       Precautions / Restrictions Precautions Precautions: Cervical Precaution Booklet Issued: Yes (comment) Precaution Comments: educated pt on cervical prec Required Braces or Orthoses: Cervical Brace Cervical Brace: Soft collar;For comfort (for comfort per MD in room, per orders no brace needed) Restrictions Weight Bearing Restrictions: No      Mobility Bed Mobility Overal bed mobility: Independent             General bed mobility comments: log roll    Transfers Overall transfer level: Needs assistance Equipment used: None Transfers: Sit to/from Stand Sit to Stand:  Supervision                  Balance Overall balance assessment: Mild deficits observed, not formally tested                                         ADL either performed or assessed with clinical judgement   ADL Overall ADL's : Needs assistance/impaired Eating/Feeding: Set up   Grooming: Set up   Upper Body Bathing: Supervision/ safety   Lower Body Bathing: Supervison/ safety   Upper Body Dressing : Supervision/safety   Lower Body Dressing: Supervision/safety   Toilet Transfer: Supervision/safety;Ambulation;Regular Social worker and Hygiene: Supervision/safety       Functional mobility during ADLs: Supervision/safety       Vision   Vision Assessment?: No apparent visual deficits     Perception Perception: Not tested       Praxis Praxis: Not tested       Pertinent Vitals/Pain Pain Assessment Pain Assessment: No/denies pain     Extremity/Trunk Assessment Upper Extremity Assessment Upper Extremity Assessment: Overall WFL for tasks assessed   Lower Extremity Assessment Lower Extremity Assessment: Overall WFL for tasks assessed   Cervical / Trunk Assessment Cervical / Trunk Assessment: Neck Surgery   Communication Communication Communication: No apparent difficulties   Cognition Arousal: Alert Behavior During Therapy: [facility] for tasks assessed/performed  General Comments  VSS    Exercises     Shoulder Instructions      Home Living Family/patient expects to be discharged to:: Private residence Living Arrangements: Spouse/significant other Available Help at Discharge: Family;Available 24 hours/day Type of Home: House Home Access: Stairs to enter [employer] of Steps: 1   Home Layout: One level     Bathroom Shower/Tub: Walk-in shower         Home Equipment: Agricultural consultant (2 wheels);Cane - single point;Shower seat - built  in          Prior Functioning/Environment Prior Level of Function : Independent/Modified Independent                        OT Problem List: Decreased range of motion;Decreased strength;Decreased activity tolerance;Impaired balance (sitting and/or standing)      OT Treatment/Interventions:      OT Goals(Current goals can be found in the care plan section) Acute Rehab OT Goals Patient Stated Goal: none stated OT Goal Formulation: With patient Time For Goal Achievement: 03/28/23 Potential to Achieve Goals: Good  OT Frequency:      Co-evaluation              AM-PAC OT "6 Clicks" Daily Activity     Outcome Measure Help from another person eating meals?: None Help from another person taking care of personal grooming?: None Help from another person toileting, which includes using toliet, bedpan, or urinal?: A Little Help from another person bathing (including washing, rinsing, drying)?: A Little Help from another person to put on and taking off regular upper body clothing?: A Little Help from another person to put on and taking off regular lower body clothing?: A Little 6 Click Score: 20   End of Session Equipment Utilized During Treatment: Cervical collar Nurse Communication: Mobility status  Activity Tolerance: Patient tolerated treatment well Patient left: in bed;with call bell/phone within reach;with family/visitor present  OT Visit Diagnosis: Unsteadiness on feet (R26.81);Other abnormalities of gait and mobility (R26.89);Muscle weakness (generalized) (M62.81)                Time: 0102-7253 OT Time Calculation (min): 30 min Charges:  OT General Charges $OT Visit: 1 Visit OT Evaluation $OT Eval Low Complexity: 1 Low OT Treatments $Self Care/Home Management : 8-22 mins  Carver Fila, OTD, OTR/L SecureChat Preferred Acute Rehab [phone]   Bond Grieshop K Koonce 03/14/2023, 9:09 AM

## 2023-03-14 NOTE — Discharge Summary (Signed)
Physician Discharge Summary  Patient ID: Shawn Lam MRN: [id number] DOB/AGE: 05/01/1950 72 y.o.  Admit date: 03/13/2023 Discharge date: 03/14/2023  Admission Diagnoses: Cervical spondylosis with radiculopathy for 5 C5-6     Discharge Diagnoses: same   Discharged Condition: good  Hospital Course: The patient was admitted on 03/13/2023 and taken to the operating room where the patient underwent acdf C4-5, C5-6. The patient tolerated the procedure well and was taken to the recovery room and then to the floor in stable condition. The hospital course was routine. There were no complications. The wound remained clean dry and intact. Pt had appropriate neck soreness. No complaints of arm pain or new N/T/W. The patient remained afebrile with stable vital signs, and tolerated a regular diet. The patient continued to increase activities, and pain was well controlled with oral pain medications.   Consults: None  Significant Diagnostic Studies:  Results for orders placed or performed during the hospital encounter of 03/08/23  Surgical pcr screen   Specimen: Nasal Mucosa; Nasal Swab  Result Value Ref Range   MRSA, PCR NEGATIVE NEGATIVE   Staphylococcus aureus POSITIVE (A) NEGATIVE  Basic metabolic panel per protocol  Result Value Ref Range   Sodium 142 135 - 145 mmol/L   Potassium 4.3 3.5 - 5.1 mmol/L   Chloride 108 98 - 111 mmol/L   CO2 26 22 - 32 mmol/L   Glucose, Bld 92 70 - 99 mg/dL   BUN 22 8 - 23 mg/dL   Creatinine, Ser 7.82 (H) 0.61 - 1.24 mg/dL   Calcium 9.8 8.9 - 95.6 mg/dL   GFR, Estimated 49 (L) >60 mL/min   Anion gap 8 5 - 15  CBC per protocol  Result Value Ref Range   WBC 7.6 4.0 - 10.5 K/uL   RBC 3.90 (L) 4.22 - 5.81 MIL/uL   Hemoglobin 12.7 (L) 13.0 - 17.0 g/dL   HCT 21.3 08.6 - 57.8 %   MCV 102.8 (H) 80.0 - 100.0 fL   MCH 32.6 26.0 - 34.0 pg   MCHC 31.7 30.0 - 36.0 g/dL   RDW 46.9 62.9 - 52.8 %   Platelets 201 150 - 400 K/uL   nRBC 0.0 0.0 - 0.2 %   *Note: Due  to a large number of results and/or encounters for the requested time period, some results have not been displayed. A complete set of results can be found in Results Review.    DG Cervical Spine 1 View  Result Date: 03/13/2023 CLINICAL DATA:  C4-5 and C5-6 ACDF. EXAM: DG CERVICAL SPINE - 1 VIEW COMPARISON:  Cervical spine radiographs 04/04/2004. Cervical MRI 08/22/2022. FINDINGS: C-arm fluoroscopy was provided in the operating room without the presence of a radiologist.8.1 seconds fluoroscopy time. 0.46 mGy air kerma. Three C-arm fluoroscopic images were obtained intraoperatively and are submitted for post operative interpretation. Images demonstrate localization and subsequent C4-6 ACDF with an anterior plate, screws and interbody spacers. No complications are identified. Please see intraoperative findings for further detail. IMPRESSION: Intraoperative C-arm fluoroscopy during C4-6 ACDF. Electronically Signed   By: Carey Bullocks M.D.   On: 03/13/2023 16:02   DG C-Arm 1-60 Min-No Report  Result Date: 03/13/2023 Fluoroscopy was utilized by the requesting physician.  No radiographic interpretation.   DG C-Arm 1-60 Min-No Report  Result Date: 03/13/2023 Fluoroscopy was utilized by the requesting physician.  No radiographic interpretation.    Antibiotics:  Anti-infectives (From admission, onward)    Start     Dose/Rate Route Frequency Ordered Stop  03/13/23 1800  ceFAZolin (ANCEF) IVPB 2g/100 mL premix        2 g 200 mL/hr over 30 Minutes Intravenous Every 8 hours 03/13/23 1337 03/14/23 0233   03/13/23 0946  ceFAZolin (ANCEF) 2-4 GM/100ML-% IVPB       Note to Pharmacy: Kandice Hams D: cabinet override      03/13/23 0946 03/13/23 2159   03/13/23 0945  ceFAZolin (ANCEF) IVPB 2g/100 mL premix        2 g 200 mL/hr over 30 Minutes Intravenous On call to O.R. 03/13/23 0944 03/13/23 1042       Discharge Exam: Blood pressure (!) 166/90, pulse 66, temperature 97.6 F (36.4 C), temperature  source Oral, resp. rate 18, height 6' (1.829 m), weight 63 kg, SpO2 100%. Neurologic: Grossly normal Ambulating and voiding well incision cdi   Discharge Medications:   Allergies as of 03/14/2023       Reactions   Codeine Nausea Only, Nausea And Vomiting   Tape Other (See Comments)   Use only paper tape. Severe blistering and bruising with other tapes. No cloth tape.        Medication List     STOP taking these medications    clopidogrel 75 MG tablet Commonly known as: PLAVIX   gabapentin 100 MG capsule Commonly known as: NEURONTIN   HYDROcodone-acetaminophen 7.5-325 MG tablet Commonly known as: NORCO Replaced by: HYDROcodone-acetaminophen 5-325 MG tablet       TAKE these medications    Abdominal Binder/Elastic Large Misc 1 Units by Does not apply route daily.   aspirin EC 81 MG tablet Take 81 mg by mouth daily.   atorvastatin 80 MG tablet Commonly known as: LIPITOR Take 1 tablet (80 mg total) by mouth daily.   buPROPion 150 MG 24 hr tablet Commonly known as: WELLBUTRIN XL TAKE 2 TABLETS BY MOUTH DAILY   busPIRone 10 MG tablet Commonly known as: BUSPAR TAKE 1 TABLET BY MOUTH 3 TIMES A DAY What changed: how much to take   cyanocobalamin 1000 MCG tablet Commonly known as: VITAMIN B12 Take 1,000 mcg by mouth daily.   denosumab 60 MG/ML Sosy injection Commonly known as: PROLIA Inject 60 mg into the skin every 6 (six) months. Do not send RX, order this from the office   DULoxetine 30 MG capsule Commonly known as: CYMBALTA TAKE THREE CAPSULES BY MOUTH DAILY   ferrous sulfate 325 (65 FE) MG tablet Take 325 mg by mouth daily with breakfast.   finasteride 5 MG tablet Commonly known as: PROSCAR Take 5 mg by mouth daily.   HYDROcodone-acetaminophen 5-325 MG tablet Commonly known as: NORCO/VICODIN Take 2 tablets by mouth every 4 (four) hours as needed for severe pain (pain score 7-10). Replaces: HYDROcodone-acetaminophen 7.5-325 MG tablet    levothyroxine 88 MCG tablet Commonly known as: SYNTHROID TAKE 1 TABLET BY MOUTH DAILY BEFORE BREAKFAST   Magnesium 250 MG Caps Take 250 mg by mouth daily.   methocarbamol 750 MG tablet Commonly known as: Robaxin-750 Take 1 tablet (750 mg total) by mouth 4 (four) times daily.   midodrine 10 MG tablet Commonly known as: PROAMATINE Take 1 tablet (10 mg total) by mouth 3 (three) times daily.   multivitamin with minerals tablet Take 1 tablet by mouth daily.   nitroGLYCERIN 0.4 MG SL tablet Commonly known as: Nitrostat Place 1 tablet (0.4 mg total) under the tongue every 5 (five) minutes as needed for chest pain.   primidone 50 MG tablet Commonly known as: MYSOLINE Take 50 mg  by mouth 2 (two) times daily.   sucralfate 1 g tablet Commonly known as: CARAFATE TAKE 1 TABLET BY MOUTH 4 TIMES A DAY WITH MEALS AND AT BEDTIME   triamcinolone cream 0.1 % Commonly known as: KENALOG APPLY TOPICALLY TWO TIMES A DAY AS NEEDED .DO NOT APPLY TO Lenor Coffin   Vitamin D3 50 MCG (2000 UT) Tabs Take 2,000 Units by mouth daily.   zolpidem 10 MG tablet Commonly known as: AMBIEN TAKE 1 TABLET BY MOUTH EVERY NIGHT AT BEDTIME AS NEEDED What changed: when to take this        Disposition: home   Final Dx: acdf C4-5, C5-6  Discharge Instructions      Remove dressing in 72 hours   Complete by: As directed    Call MD for:   Complete by: As directed    Call MD for:  difficulty breathing, headache or visual disturbances   Complete by: As directed    Call MD for:  hives   Complete by: As directed    Call MD for:  persistant dizziness or light-headedness   Complete by: As directed    Call MD for:  persistant nausea and vomiting   Complete by: As directed    Call MD for:  redness, tenderness, or signs of infection (pain, swelling, redness, odor or green/yellow discharge around incision site)   Complete by: As directed    Call MD for:  severe uncontrolled pain   Complete by: As  directed    Call MD for:  temperature >100.4   Complete by: As directed    Diet - low sodium heart healthy   Complete by: As directed    Driving Restrictions   Complete by: As directed    No driving for 2 weeks, no riding in the car for 1 week   Increase activity slowly   Complete by: As directed    Lifting restrictions   Complete by: As directed    No lifting more than 8 lbs          Signed: Tiana Loft Emara Lichter 03/14/2023, 7:39 AM

## 2023-03-14 NOTE — Progress Notes (Signed)
Patient alert and oriented, mae's well, voiding adequate amount of urine, swallowing without difficulty, no c/o pain at time of discharge. Patient discharged home with family. Script and discharged instructions given to patient. Patient and family stated understanding of instructions given. Patient has an appointment with Dr. Cram 

## 2023-03-16 ENCOUNTER — Telehealth: Payer: Self-pay | Admitting: *Deleted

## 2023-03-16 DIAGNOSIS — E782 Mixed hyperlipidemia: Secondary | ICD-10-CM

## 2023-03-16 DIAGNOSIS — E039 Hypothyroidism, unspecified: Secondary | ICD-10-CM

## 2023-03-16 MED FILL — Thrombin For Soln 5000 Unit: CUTANEOUS | Qty: 2 | Status: AC

## 2023-03-16 NOTE — Telephone Encounter (Signed)
-----   Message from Lovena Neighbours sent at 03/16/2023  1:52 PM EST ----- Regarding: Labs for Friday 12.20.24 Please put physical lab orders in future. Thank you, Denny Peon

## 2023-03-21 ENCOUNTER — Emergency Department: Payer: Medicare Other

## 2023-03-21 ENCOUNTER — Other Ambulatory Visit: Payer: Self-pay

## 2023-03-21 ENCOUNTER — Emergency Department
Admission: EM | Admit: 2023-03-21 | Discharge: 2023-03-21 | Disposition: A | Discharge status: Home / Self Care | Payer: Medicare Other | Attending: Emergency Medicine | Admitting: Emergency Medicine

## 2023-03-21 ENCOUNTER — Encounter: Payer: Self-pay | Admitting: Emergency Medicine

## 2023-03-21 DIAGNOSIS — G4489 Other headache syndrome: Secondary | ICD-10-CM | POA: Diagnosis not present

## 2023-03-21 DIAGNOSIS — I6529 Occlusion and stenosis of unspecified carotid artery: Secondary | ICD-10-CM | POA: Insufficient documentation

## 2023-03-21 DIAGNOSIS — I6523 Occlusion and stenosis of bilateral carotid arteries: Secondary | ICD-10-CM | POA: Diagnosis not present

## 2023-03-21 DIAGNOSIS — R519 Headache, unspecified: Secondary | ICD-10-CM | POA: Diagnosis not present

## 2023-03-21 DIAGNOSIS — S0990XA Unspecified injury of head, initial encounter: Secondary | ICD-10-CM | POA: Diagnosis not present

## 2023-03-21 DIAGNOSIS — I10 Essential (primary) hypertension: Secondary | ICD-10-CM | POA: Diagnosis not present

## 2023-03-21 DIAGNOSIS — R630 Anorexia: Secondary | ICD-10-CM | POA: Diagnosis not present

## 2023-03-21 DIAGNOSIS — R110 Nausea: Secondary | ICD-10-CM | POA: Diagnosis not present

## 2023-03-21 DIAGNOSIS — S199XXA Unspecified injury of neck, initial encounter: Secondary | ICD-10-CM | POA: Diagnosis not present

## 2023-03-21 LAB — CBC
HCT: 41.9 % (ref 39.0–52.0)
Hemoglobin: 14 g/dL (ref 13.0–17.0)
MCH: 32.7 pg (ref 26.0–34.0)
MCHC: 33.4 g/dL (ref 30.0–36.0)
MCV: 97.9 fL (ref 80.0–100.0)
Platelets: 278 10*3/uL (ref 150–400)
RBC: 4.28 MIL/uL (ref 4.22–5.81)
RDW: 12.8 % (ref 11.5–15.5)
WBC: 7.6 10*3/uL (ref 4.0–10.5)
nRBC: 0 % (ref 0.0–0.2)

## 2023-03-21 LAB — BASIC METABOLIC PANEL
Anion gap: 12 (ref 5–15)
BUN: 19 mg/dL (ref 8–23)
CO2: 26 mmol/L (ref 22–32)
Calcium: 9.2 mg/dL (ref 8.9–10.3)
Chloride: 99 mmol/L (ref 98–111)
Creatinine, Ser: 1.14 mg/dL (ref 0.61–1.24)
GFR, Estimated: >60 mL/min (ref >60)
Glucose, Bld: 109 mg/dL — ABNORMAL HIGH (ref 70–99)
Potassium: 3.9 mmol/L (ref 3.5–5.1)
Sodium: 137 mmol/L (ref 135–145)

## 2023-03-21 MED ORDER — ONDANSETRON 4 MG PO TBDP: 4.0000 mg | ORAL_TABLET | Freq: Four times a day (QID) | ORAL | 0 refills | Status: DC | PRN

## 2023-03-21 MED ORDER — ACETAMINOPHEN 10 MG/ML IV SOLN
1000.0000 mg | Freq: Once | INTRAVENOUS | Status: AC
Start: 2023-03-21 — End: 2023-03-21
  Administered 2023-03-21: 1000 mg via INTRAVENOUS
  Filled 2023-03-21: qty 100

## 2023-03-21 MED ORDER — ONDANSETRON HCL 4 MG/2ML IJ SOLN
4.0000 mg | INTRAMUSCULAR | Status: AC
  Administered 2023-03-21: 4 mg via INTRAVENOUS
  Filled 2023-03-21: qty 2

## 2023-03-21 MED ORDER — IOHEXOL 300 MG/ML  SOLN: 100.0000 mL | Freq: Once | INTRAMUSCULAR | Status: DC | PRN

## 2023-03-21 MED ORDER — SODIUM CHLORIDE 0.9 % IV BOLUS
500.0000 mL | Freq: Once | INTRAVENOUS | Status: AC
  Administered 2023-03-21: 500 mL via INTRAVENOUS

## 2023-03-21 MED ORDER — FENTANYL CITRATE PF 50 MCG/ML IJ SOSY
50.0000 ug | PREFILLED_SYRINGE | Freq: Once | INTRAMUSCULAR | Status: AC
  Administered 2023-03-21: 50 ug via INTRAVENOUS
  Filled 2023-03-21: qty 1

## 2023-03-21 MED ORDER — IOHEXOL 350 MG/ML SOLN
80.0000 mL | Freq: Once | INTRAVENOUS | Status: AC | PRN
  Administered 2023-03-21: 80 mL via INTRAVENOUS

## 2023-03-21 NOTE — ED Triage Notes (Addendum)
EMS brings pt in from home; recent cervical discectomy and fusion; left sided HA accomp by N/V since Sunday; pt with soft collar in place; unable to take any meds as rx

## 2023-03-21 NOTE — ED Notes (Signed)
Pt. To CT

## 2023-03-21 NOTE — ED Notes (Signed)
See triage note  Presents with headache and n/v   States he had recent surgery  developed h/a with some n/v  No fever

## 2023-03-21 NOTE — ED Notes (Signed)
This RN to bedside for initial contact with pt. PT. Is alert and oriented, pleasant and conversational with this RN. Pt's spouse at bedside. NAD.

## 2023-03-21 NOTE — ED Provider Notes (Signed)
[facility] Provider Note    Event Date/Time   First MD Initiated Contact with Patient 03/21/23 [phone]     (approximate)   History   Headache   HPI  Shawn Lam is a 72 y.o. male had a cervical discectomy on 03/13/23.  Has been experiencing a headache for about the last 2 to 3 days.  Suffer similar headaches since about 2022 follows with Dr. Sherryll Burger of neurology.  He had neck pain that led to him ultimately having a cervical fusion.  He has done well his incision looks great.  He has had no fever no chills.  He reports that after starting prescription pain medicine he had nausea and vomiting, no pain.  He reports that his doctor tried to prescribe a steroid yesterday as well but advises that after taking it he felt nauseated.  He reports decreased appetite  No abdominal pain.   No changes in vision.  Reports the pain is across the front of his forehead throbbing in nature.  Reports has been diagnosed the same as "migraines" with MRIs and previous evaluation with Dr. Clelia Croft     Physical Exam   Triage Vital Signs: ED Triage Vitals  Encounter Vitals Group     BP 03/21/23 0650 (!) 153/101     Systolic BP Percentile --      Diastolic BP Percentile --      Pulse Rate 03/21/23 0650 85     Resp 03/21/23 0650 18     Temp 03/21/23 0653 97.7 F (36.5 C)     Temp Source 03/21/23 0650 Oral     SpO2 03/21/23 0641 99 %     Weight 03/21/23 0655 140 lb (63.5 kg)     Height 03/21/23 0655 6' (1.829 m)     Head Circumference --      Peak Flow --      Pain Score 03/21/23 0654 10     Pain Loc --      Pain Education --      Exclude from Growth Chart --     Most recent vital signs: Vitals:   03/21/23 0653 03/21/23 0817  BP:  (!) 169/98  Pulse:  78  Resp:  18  Temp: 97.7 F (36.5 C)   SpO2:  98%     General: Awake, no distress.  Normocephalic atraumatic.  Extraocular movements normal.  Moves upper extremities well with 5 out of 5 strength  bilateral Well-oriented, normal facial expressions CV:  Good peripheral perfusion.  Resp:  Normal effort.  Abd:  No distention.  Nontender Other:  Anterior neck ACDF surgical incision covered with Steri-Strips, appears clean dry intact no surrounding edema erythema fluctuance or redness.   ED Results / Procedures / Treatments   Labs (all labs ordered are listed, but only abnormal results are displayed) Labs Reviewed  BASIC METABOLIC PANEL - Abnormal; Notable for the following components:      Result Value   Glucose, Bld 109 (*)    All other components within normal limits  CBC      RADIOLOGY   CT angio head neck interpreted by me as grossly negative for acute intracranial hemorrhage or obvious carotid or vertebral dissection  CT ANGIO HEAD NECK W WO CM  Result Date: 03/21/2023 CLINICAL DATA:  Head trauma with repeat vomiting. Headache after recent ACDF. EXAM: CT ANGIOGRAPHY HEAD AND NECK WITH AND WITHOUT CONTRAST TECHNIQUE: Multidetector CT imaging of the head and neck was performed using the standard  protocol during bolus administration of intravenous contrast. Multiplanar CT image reconstructions and MIPs were obtained to evaluate the vascular anatomy. Carotid stenosis measurements (when applicable) are obtained utilizing NASCET criteria, using the distal internal carotid diameter as the denominator. RADIATION DOSE REDUCTION: This exam was performed according to the departmental dose-optimization program which includes automated exposure control, adjustment of the mA and/or kV according to patient size and/or use of iterative reconstruction technique. CONTRAST:  80mL OMNIPAQUE IOHEXOL 350 MG/ML SOLN COMPARISON:  Head CT 04/25/2022 FINDINGS: CT HEAD FINDINGS Brain: No evidence of acute infarction, hemorrhage, hydrocephalus, extra-axial collection or mass lesion/mass effect. Vascular: No hyperdense vessel or unexpected calcification. Skull: Normal. Negative for fracture or focal lesion.  Sinuses/Orbits: Partial left sphenoid sinus opacification with sclerotic wall thickening, chronic. Review of the MIP images confirms the above findings CTA NECK FINDINGS Aortic arch: Atheromatous plaque with 3 vessel branching Right carotid system: Calcified plaque at the right carotid bifurcation causing 45% stenosis based on coronal reformats. No ulceration or beading. Left carotid system: Vessels are smoothly contoured and widely patent with notable lack of atherosclerosis. Vertebral arteries: Subclavian and vertebral arteries are smoothly contoured and widely patent. Skeleton: C4-5 and C5-6 ACDF without complicating features. Other neck: No acute finding. Hypo glossal nerve stimulator on the right. Upper chest: No acute finding.  Biapical pleural based scarring. Review of the MIP images confirms the above findings CTA HEAD FINDINGS Anterior circulation: No significant stenosis, proximal occlusion, aneurysm, or vascular malformation. Posterior circulation: The vertebral and basilar arteries are smoothly contoured and widely patent. No branch occlusion, beading, or aneurysm. High-grade atheromatous narrowing at the upper branch left PCA. Venous sinuses: Unremarkable Anatomic variants: None significant Review of the MIP images confirms the above findings IMPRESSION: 1. No evidence of injury.  No acute arterial finding. 2. Atherosclerosis causing 45% stenosis at the proximal right ICA and moderate to advanced left stenosis of upper left PCA branch. Electronically Signed   By: Tiburcio Pea M.D.   On: 03/21/2023 08:51      PROCEDURES:  Critical Care performed: No  Procedures   MEDICATIONS ORDERED IN ED: Medications  iohexol (OMNIPAQUE) 300 MG/ML solution 100 mL (has no administration in time range)  sodium chloride 0.9 % bolus 500 mL (0 mLs Intravenous Stopped 03/21/23 0900)  ondansetron (ZOFRAN) injection 4 mg (4 mg Intravenous Given 03/21/23 0811)  fentaNYL (SUBLIMAZE) injection 50 mcg (50 mcg  Intravenous Given 03/21/23 0812)  acetaminophen (OFIRMEV) IV 1,000 mg (0 mg Intravenous Stopped 03/21/23 0824)  iohexol (OMNIPAQUE) 350 MG/ML injection 80 mL (80 mLs Intravenous Contrast Given 03/21/23 0819)     IMPRESSION / MDM / ASSESSMENT AND PLAN / ED COURSE  I reviewed the triage vital signs and the nursing notes.                              Differential diagnosis includes, but is not limited to, quite possibly chronic headache triggered by recent surgery, possible medication intolerance, dehydration, etc. seem most likely given his reassuring examination clinical history and vital signs.  He did recently have surgery though, will obtain CT head as well as angiography of the neck to evaluate for possible postsurgical complication, dissection etc.  However with the patient not having a fever, the report of similar headaches, and is very reassuring neurologic exam I am doubtful of a major central complication but feel it is prudent to evaluate further given his recent surgery.  No ocular symptoms  No evidence of infection.  Patient denies any obvious infectious symptoms.  No associate abdominal pain.  Appears to be recovering fairly well is fully awake and alert.  Will hydrate provide pain medication, antiemetic further testing and reassessment  Patient's presentation is most consistent with acute complicated illness / injury requiring diagnostic workup.   ----------------------------------------- 9:25 AM on 03/21/2023 ----------------------------------------- Patient resting comfortably with family at bedside.  He is in no acute distress.  Fully alert oriented, feeling improved.  Will give prescription for Zofran, careful return precautions advised and recommendation to follow-up with both Dr. Clelia Croft as well as vascular surgery.  Patient understanding agreeable with plan and appears very appropriate for discharge.  Return precautions and treatment recommendations and follow-up discussed  with the patient who is agreeable with the plan.        FINAL CLINICAL IMPRESSION(S) / ED DIAGNOSES   Final diagnoses:  Nonintractable episodic headache, unspecified headache type  Stenosis of carotid artery, unspecified laterality     Rx / DC Orders   ED Discharge Orders          Ordered    ondansetron (ZOFRAN-ODT) 4 MG disintegrating tablet  Every 6 hours PRN        03/21/23 0347             Note:  This document was prepared using Dragon voice recognition software and may include unintentional dictation errors.   Sharyn Creamer, MD 03/21/23 [phone]

## 2023-03-22 ENCOUNTER — Ambulatory Visit: Payer: Medicare Other | Admitting: Dermatology

## 2023-03-23 ENCOUNTER — Other Ambulatory Visit: Payer: Self-pay | Admitting: Family Medicine

## 2023-03-23 NOTE — Telephone Encounter (Signed)
Last office visit 07/14/2022 for neck pain.  Last refilled 02/23/2023 for #30 with no refills.  Next Appt: CPE 04/13/2023.

## 2023-03-27 ENCOUNTER — Ambulatory Visit (INDEPENDENT_AMBULATORY_CARE_PROVIDER_SITE_OTHER): Payer: Medicare Other

## 2023-03-27 VITALS — Ht 72.0 in | Wt 135.0 lb

## 2023-03-27 DIAGNOSIS — Z0000 Encounter for general adult medical examination without abnormal findings: Secondary | ICD-10-CM | POA: Diagnosis not present

## 2023-03-27 NOTE — Progress Notes (Addendum)
Subjective:   Shawn Lam is a 72 y.o. male who presents for Medicare Annual/Subsequent preventive examination.  Visit Complete: Virtual I connected with  Royce Macadamia on 03/27/23 by a audio enabled telemedicine application and verified that I am speaking with the correct person using two identifiers.  Patient Location: Home  Provider Location: Office/Clinic  I discussed the limitations of evaluation and management by telemedicine. The patient expressed understanding and agreed to proceed.  Vital Signs: Because this visit was a virtual/telehealth visit, some criteria may be missing or patient reported. Any vitals not documented were not able to be obtained and vitals that have been documented are patient reported.  Patient Medicare AWV questionnaire was completed by the patient on 03/24/23; I have confirmed that all information answered by patient is correct and no changes since this date.  Cardiac Risk Factors include: advanced age (>74men, >61 women);male gender;dyslipidemia     Objective:    Today's Vitals   03/27/23 0817 03/27/23 0818  Weight: 135 lb (61.2 kg)   Height: 6' (1.829 m)   PainSc:  3    Body mass index is 18.31 kg/m.     03/27/2023    8:28 AM 03/21/2023    6:56 AM 03/13/2023    8:15 AM 03/08/2023   10:12 AM 04/25/2022   12:34 PM 03/29/2022    8:56 AM 10/19/2021    8:46 AM  Advanced Directives  Does Patient Have a Medical Advance Directive? Yes No No No No No No  Type of Estate agent of [place];Living will        Copy of Healthcare Power of Attorney in Chart? No - copy requested        Would patient like information on creating a medical advance directive?  No - Patient declined No - Patient declined No - Patient declined No - Patient declined No - Patient declined No - Patient declined    Current Medications (verified) Outpatient Encounter Medications as of 03/27/2023  Medication Sig   aspirin EC 81 MG tablet Take 81 mg by  mouth daily.   atorvastatin (LIPITOR) 80 MG tablet Take 1 tablet (80 mg total) by mouth daily.   buPROPion (WELLBUTRIN XL) 150 MG 24 hr tablet TAKE 2 TABLETS BY MOUTH DAILY   busPIRone (BUSPAR) 10 MG tablet TAKE 1 TABLET BY MOUTH 3 TIMES A DAY (Patient taking differently: Take 20 mg by mouth 3 (three) times daily.)   Cholecalciferol (VITAMIN D3) 50 MCG (2000 UT) TABS Take 2,000 Units by mouth daily.   cyanocobalamin (VITAMIN B12) 1000 MCG tablet Take 1,000 mcg by mouth daily.   denosumab (PROLIA) 60 MG/ML SOSY injection Inject 60 mg into the skin every 6 (six) months. Do not send RX, order this from the office   DULoxetine (CYMBALTA) 30 MG capsule TAKE THREE CAPSULES BY MOUTH DAILY   Elastic Bandages & Supports (ABDOMINAL BINDER/ELASTIC LARGE) MISC 1 Units by Does not apply route daily.   ferrous sulfate 325 (65 FE) MG tablet Take 325 mg by mouth daily with breakfast.   finasteride (PROSCAR) 5 MG tablet Take 5 mg by mouth daily.   levothyroxine (SYNTHROID) 88 MCG tablet TAKE 1 TABLET BY MOUTH DAILY BEFORE BREAKFAST   Magnesium 250 MG CAPS Take 250 mg by mouth daily.   methocarbamol (ROBAXIN-750) 750 MG tablet Take 1 tablet (750 mg total) by mouth 4 (four) times daily.   midodrine (PROAMATINE) 10 MG tablet Take 1 tablet (10 mg total) by mouth 3 (three)  times daily.   Multiple Vitamins-Minerals (MULTIVITAMIN WITH MINERALS) tablet Take 1 tablet by mouth daily.   nitroGLYCERIN (NITROSTAT) 0.4 MG SL tablet Place 1 tablet (0.4 mg total) under the tongue every 5 (five) minutes as needed for chest pain.   ondansetron (ZOFRAN-ODT) 4 MG disintegrating tablet Take 1 tablet (4 mg total) by mouth every 6 (six) hours as needed for nausea or vomiting.   primidone (MYSOLINE) 50 MG tablet Take 50 mg by mouth 2 (two) times daily.   sucralfate (CARAFATE) 1 g tablet TAKE 1 TABLET BY MOUTH 4 TIMES A DAY WITH MEALS AND AT BEDTIME   triamcinolone cream (KENALOG) 0.1 % APPLY TOPICALLY TWO TIMES A DAY AS NEEDED .DO NOT  APPLY TO FACE, GROIN, UNDERAMS   zolpidem (AMBIEN) 10 MG tablet TAKE 1 TABLET BY MOUTH EVERY NIGHT AT BEDTIME AS NEEDED   HYDROcodone-acetaminophen (NORCO/VICODIN) 5-325 MG tablet Take 2 tablets by mouth every 4 (four) hours as needed for severe pain (pain score 7-10). (Patient not taking: Reported on 03/27/2023)   No facility-administered encounter medications on file as of 03/27/2023.    Allergies (verified) Codeine and Tape   History: Past Medical History:  Diagnosis Date   Actinic keratosis 02/04/2021   right lateral neck   Anemia    Anxiety    Asymptomatic LV dysfunction    50-55% by echo 06/2016   Berger's disease    CAD (coronary artery disease) 06/2005   PCI of LAD and RCA   Depression    Dilated aortic root (HCC)    4cm at sinus of Valsalva   Hematuria    Hyperlipidemia    Hypothyroidism    Insomnia    Left ureteral stone    Orthostatic hypotension    negative tilt table   OSA (obstructive sleep apnea)    moderate with AHI 17/hr, uses CPAP nightly(01/15/19 - has Inspire implant)   PVC's (premature ventricular contractions) 07/07/2016   Renal disorder    PT IS SEEING DR. Eliott Nine- NEPHROLOGIST   Rosacea    Urticaria    Past Surgical History:  Procedure Laterality Date   ANTERIOR CERVICAL DECOMP/DISCECTOMY FUSION N/A 03/13/2023   Procedure: CERVICAL FOUR-FIVE, CERVICAL FIVE-SIX ANTERIOR CERVICAL DISCECTOMY/DECOMPRESSION FUSION;  Surgeon: Donalee Citrin, MD;  Location: [facility] OR;  Service: Neurosurgery;  Laterality: N/A;  3C   CARDIAC CATHETERIZATION  09/06/2013   [facility]   CARDIAC CATHETERIZATION     COLONOSCOPY WITH PROPOFOL N/A 01/21/2019   Procedure: COLONOSCOPY WITH PROPOFOL;  Surgeon: Midge Minium, MD;  Location: [facility];  Service: Endoscopy;  Laterality: N/A;  sleep apnea   COLONOSCOPY WITH PROPOFOL N/A 10/19/2021   Procedure: COLONOSCOPY WITH PROPOFOL;  Surgeon: Midge Minium, MD;  Location: [facility] ENDOSCOPY;  Service: Endoscopy;  Laterality: N/A;   CORONARY  ANGIOPLASTY  2004   STENT PLACEMENT   CYSTOSCOPY WITH RETROGRADE PYELOGRAM, URETEROSCOPY AND STENT PLACEMENT Left 03/19/2014   Procedure: CYSTOSCOPY WITH RETROGRADE PYELOGRAM, URETEROSCOPY AND STENT PLACEMENT;  Surgeon: Loletta Parish, MD;  Location: [facility] ORS;  Service: Urology;  Laterality: Left;   CYSTOSCOPY WITH RETROGRADE PYELOGRAM, URETEROSCOPY AND STENT PLACEMENT Bilateral 05/20/2015   Procedure:  CYSTOSCOPY WITH BILATERAL RETROGRADE PYELOGRAM,RIGHT  DIAGNOSTIC URETEROSCOPY ,LEFT URETEROSCOPY WITH HOLMIUM LASER  AND BILATERAL  STENT PLACEMENT ;  Surgeon: Sebastian Ache, MD;  Location: [facility];  Service: Urology;  Laterality: Bilateral;   DRUG INDUCED ENDOSCOPY N/A 10/20/2017   Procedure: DRUG INDUCED SLEEP ENDOSCOPY;  Surgeon: Christia Reading, MD;  Location: [facility];  Service:  ENT;  Laterality: N/A;   EP IMPLANTABLE DEVICE N/A 05/13/2016   Procedure: Loop Recorder Insertion;  Surgeon: Duke Salvia, MD;  Location: [facility] INVASIVE CV LAB;  Service: Cardiovascular;  Laterality: N/A;   ESOPHAGOGASTRODUODENOSCOPY N/A 10/19/2021   Procedure: ESOPHAGOGASTRODUODENOSCOPY (EGD);  Surgeon: Midge Minium, MD;  Location: [facility] END[facility]COPY;  Service: Endoscopy;  Laterality: N/A;   ESOPHAGOGASTRODUODENOSCOPY (EGD) WITH PROPOFOL N/A 11/30/2018   Procedure: ESOPHAGOGASTRODUODENOSCOPY (EGD) WITH PROPOFOL;  Surgeon: Wyline Mood, MD;  Location: [facility] ENDOSCOPY;  Service: Gastroenterology;  Laterality: N/A;   ESOPHAGOGASTRODUODENOSCOPY (EGD) WITH PROPOFOL N/A 12/01/2018   Procedure: ESOPHAGOGASTRODUODENOSCOPY (EGD) WITH PROPOFOL;  Surgeon: Midge Minium, MD;  Location: [facility] ENDOSCOPY;  Service: Endoscopy;  Laterality: N/A;   HOLMIUM LASER APPLICATION Bilateral 05/20/2015   Procedure: HOLMIUM LASER APPLICATION;  Surgeon: Sebastian Ache, MD;  Location: [facility];  Service: Urology;  Laterality: Bilateral;   IMPLIMENTATION OF A HYPOGLOSSAL NERVE STIMULATOR  12/08/2017   OSA    LEFT HEART CATHETERIZATION WITH CORONARY  ANGIOGRAM N/A 09/06/2013   Procedure: LEFT HEART CATHETERIZATION WITH CORONARY ANGIOGRAM;  Surgeon: Lesleigh Noe, MD;  Location: [facility] CATH LAB;  Service: Cardiovascular;  Laterality: N/A;   LEFT KNEE CAP  SURGERY ABOUT 40 YRS AGO  ? 1970     STONE EXTRACTION WITH BASKET Left 03/19/2014   Procedure: STONE EXTRACTION WITH BASKET;  Surgeon: Loletta Parish, MD;  Location: [facility];  Service: Urology;  Laterality: Left;   TRANSURETHRAL RESECTION OF PROSTATE N/A 08/25/2021   Procedure: TRANSURETHRAL RESECTION OF THE PROSTATE (TURP);  Surgeon: Sebastian Ache, MD;  Location: [facility];  Service: Urology;  Laterality: N/A;  1 HR   Family History  Problem Relation Age of Onset   Coronary artery disease Mother    Heart attack Mother    Heart disease Mother    Hypertension Mother    Cancer Father        lung and colon   Lung cancer Father        smoker   Diabetes Brother    Social History   Socioeconomic History   Marital status: Married    Spouse name: Not on file   Number of children: Not on file   Years of education: Not on file   Highest education level: Associate degree: occupational, Scientist, product/process development, or vocational program  Occupational History   Occupation: retired Health and safety inspector  Tobacco Use   Smoking status: Never   Smokeless tobacco: Never  Vaping Use   Vaping status: Never Used  Substance and Sexual Activity   Alcohol use: Not Currently    Alcohol/week: 1.0 standard drink of alcohol    Types: 1 Cans of beer per week   Drug use: No   Sexual activity: Yes    Partners: Female    Birth control/protection: None  Other Topics Concern   Not on file  Social History Narrative   Regular exercise--yes--jog 5 miles 6 days a week   Social Drivers of Corporate investment banker Strain: Medium Risk (03/24/2023)   Overall Financial Resource Strain (CARDIA)    Difficulty of Paying Living Expenses: Somewhat hard  Food Insecurity: Food Insecurity Present (03/24/2023)   Hunger Vital Sign     Worried About Running Out of Food in the Last Year: Sometimes true    Ran Out of Food in the Last Year: Sometimes true  Transportation Needs: No Transportation Needs (03/24/2023)   PRAPARE - Transportation    Lack of Transportation (Medical): No    Lack of  Transportation (Non-Medical): No  Physical Activity: Sufficiently Active (03/24/2023)   Exercise Vital Sign    Days of Exercise per Week: 7 days    Minutes of Exercise per Session: 60 min  Stress: Stress Concern Present (03/24/2023)   [employer] of Occupational Health - Occupational Stress Questionnaire    Feeling of Stress : Very much  Social Connections: Moderately Integrated (03/24/2023)   Social Connection and Isolation Panel [NHANES]    Frequency of Communication with Friends and Family: Twice a week    Frequency of Social Gatherings with Friends and Family: Once a week    Attends Religious Services: Never    Database administrator or Organizations: No    Attends Engineer, structural: 1 to 4 times per year    Marital Status: Married    Tobacco Counseling Counseling given: Not Answered  Clinical Intake:  Pre-visit preparation completed: No  Pain : 0-10 Pain Score: 3  Pain Type: Acute pain (post-op pain..surgery on neck 2 weeks ago) Pain Location: Neck Pain Orientation: Anterior Pain Descriptors / Indicators: Aching Pain Onset: 1 to 4 weeks ago Pain Frequency: Constant Pain Relieving Factors: Tylenol helps a little   BMI - recorded: 18.31 Nutritional Status: BMI <19  Underweight Nutritional Risks: Nausea/ vomitting/ diarrhea (n/v this past weekend:Dr [name] notified (surgeon)) Diabetes: No  How often do you need to have someone help you when you read instructions, pamphlets, or other written materials from your doctor or pharmacy?: 1 - Never  Interpreter Needed?: No  Comments: lives with wife Information entered by :: B.Ishaan Villamar,LPN   Activities of Daily Living    03/24/2023    3:49 AM  03/08/2023   10:13 AM  In your present state of health, do you have any difficulty performing the following activities:  Hearing? 1   Vision? 1   Difficulty concentrating or making decisions? 0   Walking or climbing stairs? 0   Dressing or bathing? 0   Doing errands, shopping? 1 0  Preparing Food and eating ? N   Using the Toilet? N   In the past six months, have you accidently leaked urine? N   Do you have problems with loss of bowel control? N   Managing your Medications? N   Managing your Finances? N   Housekeeping or managing your Housekeeping? N     Patient Care Team: Excell Seltzer, MD as PCP - General Mariah Milling Tollie Pizza, MD as PCP - Cardiology (Cardiology) Kathyrn Sheriff, [facility] (Inactive) as Pharmacist (Pharmacist)  Indicate any recent Medical Services you may have received from other than [facility] providers in the past year (date may be approximate).     Assessment:   This is a routine wellness examination for Shawn Lam.  Hearing/Vision screen Hearing Screening - Comments:: Pt says he hears pretty good Vision Screening - Comments:: Pt says his vision is good Dr Edger House   Goals Addressed             This Visit's Progress    COMPLETED: DIET - EAT MORE FRUITS AND VEGETABLES       Starting 02/13/2018, I will attempt to eat at least 2 servings of fresh fruits and vegetables daily.      COMPLETED: Patient Stated   On track    02/21/2019, I will maintain and continue medications as prescribed.     COMPLETED: Patient Stated   On track    02/25/2020, I will continue to walk 6 days a week for 1 hour.  Patient Stated   On track    Would like to continue to walk. 03/27/23 pt would like to continue this       Depression Screen    03/27/2023    8:25 AM 03/29/2022    8:52 AM 03/08/2022    4:38 PM 07/30/2021   11:37 AM 07/30/2021   11:31 AM 02/26/2021   11:46 AM 02/25/2020   11:16 AM  PHQ 2/9 Scores  PHQ - 2 Score 0 0 5 0 0 0 0  PHQ- 9 Score  0 12 0 0  0     Fall Risk    03/24/2023    3:49 AM 03/29/2022    8:54 AM 07/30/2021   11:30 AM 02/26/2021   11:46 AM 02/25/2020   11:15 AM  Fall Risk   Falls in the past year? 0 1 1 0 0  Number falls in past yr: 1 0 1 0 0  Injury with Fall? 1 1 0 0 0  Comment  Head injury. Followed by medical attention.     Risk for fall due to :  Impaired balance/gait No Fall Risks No Fall Risks Medication side effect  Follow up  Falls prevention discussed Falls evaluation completed Falls prevention discussed Falls evaluation completed;Falls prevention discussed    MEDICARE RISK AT HOME: Medicare Risk at Home Any stairs in or around the home?: No If so, are there any without handrails?: No Home free of loose throw rugs in walkways, pet beds, electrical cords, etc?: Yes Adequate lighting in your home to reduce risk of falls?: Yes Life alert?: No Use of a cane, walker or w/c?: No Grab bars in the bathroom?: Yes Shower chair or bench in shower?: Yes Elevated toilet seat or a handicapped toilet?: Yes  TIMED UP AND GO:  Was the test performed?  No    Cognitive Function:    02/25/2020   11:20 AM 02/21/2019   11:42 AM 02/13/2018   12:20 PM  MMSE - Mini Mental State Exam  Orientation to time 5 5 5   Orientation to Place 5 5 5   Registration 3 3 3   Attention/ Calculation 5 5 0  Recall 3 3 2   Recall-comments   unable to recall 1 of 3 words  Language- name 2 objects   0  Language- repeat 1 1 1   Language- follow 3 step command   3  Language- read & follow direction   0  Write a sentence   0  Copy design   0  Total score   19        03/27/2023    8:31 AM 03/29/2022    8:57 AM  6CIT Screen  What Year? 0 points 4 points  What month? 0 points 0 points  What time? 0 points 0 points  Count back from 20 0 points 0 points  Months in reverse 0 points 2 points  Repeat phrase 0 points 0 points  Total Score 0 points 6 points    Immunizations Immunization History  Administered Date(s) Administered    Fluad Quad(high Dose 65+) 12/04/2018, 01/01/2020   Influenza Split 01/04/2011, 01/27/2012   Influenza Whole 01/09/2008, 03/04/2009, 12/27/2009   Influenza, High Dose Seasonal PF 01/04/2021   Influenza,inj,Quad PF,6+ Mos 01/31/2013, 01/31/2014, 02/03/2015, 02/09/2016, 01/20/2017, 01/23/2018   Influenza-Unspecified 12/08/2021   PFIZER Comirnaty(Gray Top)Covid-19 Tri-Sucrose Vaccine 05/04/2020   PFIZER(Purple Top)SARS-COV-2 Vaccination 06/01/2019, 06/25/2019   Pneumococcal Conjugate-13 02/09/2016   Pneumococcal Polysaccharide-23 02/23/2017   Td 06/05/2007   Tdap 03/13/2019  Zoster Recombinant(Shingrix) 10/13/2021, 12/16/2021   Zoster, Live 01/25/2011    TDAP status: Up to date  Flu Vaccine status: Up to date  Pneumococcal vaccine status: Up to date  Covid-19 vaccine status: Completed vaccines  Qualifies for Shingles Vaccine? Yes   Zostavax completed Yes   Shingrix Completed?: Yes  Screening Tests Health Maintenance  Topic Date Due   COVID-19 Vaccine (4 - 2024-25 season) 12/11/2022   Medicare Annual Wellness (AWV)  03/26/2024   DEXA SCAN  04/13/2024   Colonoscopy  10/20/2026   DTaP/Tdap/Td (3 - Td or Tdap) 03/12/2029   Pneumonia Vaccine 65+ Years old  Completed   INFLUENZA VACCINE  Completed   Hepatitis C Screening  Completed   Zoster Vaccines- Shingrix  Completed   HPV VACCINES  Aged Out    Health Maintenance  Health Maintenance Due  Topic Date Due   COVID-19 Vaccine (4 - 2024-25 season) 12/11/2022    Colorectal cancer screening: Type of screening: Colonoscopy. Completed 10/19/2021. Repeat every 5 years  Dexa Scan: 04/13/2022 :Osteoporosis: repeat :04/13/2024  Lung Cancer Screening: (Low Dose CT Chest recommended if Age 85-80 years, 20 pack-year currently smoking OR have quit w/in 15years.) does not qualify.   Lung Cancer Screening Referral: no  Additional Screening:  Hepatitis C Screening: does not qualify; Completed 02/03/2015  Vision Screening: Recommended  annual ophthalmology exams for early detection of glaucoma and other disorders of the eye. Is the patient up to date with their annual eye exam?  Yes  Who is the provider or what is the name of the office in which the patient attends annual eye exams? Dr Edger House If pt is not established with a provider, would they like to be referred to a provider to establish care? No .   Dental Screening: Recommended annual dental exams for proper oral hygiene  Diabetic Foot Exam: n/a  Community Resource Referral / Chronic Care Management: CRR required this visit?  No   CCM required this visit?  No    Plan:     I have personally reviewed and noted the following in the patient's chart:   Medical and social history Use of alcohol, tobacco or illicit drugs  Current medications and supplements including opioid prescriptions. Patient is not currently taking opioid prescriptions. Functional ability and status Nutritional status Physical activity Advanced directives List of other physicians Hospitalizations, surgeries, and ER visits in previous 12 months Vitals Screenings to include cognitive, depression, and falls Referrals and appointments  In addition, I have reviewed and discussed with patient certain preventive protocols, quality metrics, and best practice recommendations. A written personalized care plan for preventive services as well as general preventive health recommendations were provided to patient.    Sue Lush, LPN   16/01/9603   After Visit Summary: (MyChart) Due to this being a telephonic visit, the after visit summary with patients personalized plan was offered to patient via MyChart   Nurse Notes: Pt says he is "coming along" after neck surgery 2 weeks ago. He relays he no longer takes the opioid and has transitioned to Tylenol for pain. He relays he experienced a bout of n/v over the weekend but notified his surgeon who called in medication for him. He sts that has  subsided now.He has no concerns or comments

## 2023-03-27 NOTE — Patient Instructions (Signed)
Mr. Brandl , Thank you for taking time to come for your Medicare Wellness Visit. I appreciate your ongoing commitment to your health goals. Please review the following plan we discussed and let me know if I can assist you in the future.   Referrals/Orders/Follow-Ups/Clinician Recommendations: none  This is a list of the screening recommended for you and due dates:  Health Maintenance  Topic Date Due   COVID-19 Vaccine (4 - 2024-25 season) 12/11/2022   Medicare Annual Wellness Visit  03/26/2024   DEXA scan (bone density measurement)  04/13/2024   Colon Cancer Screening  10/20/2026   DTaP/Tdap/Td vaccine (3 - Td or Tdap) 03/12/2029   Pneumonia Vaccine  Completed   Flu Shot  Completed   Hepatitis C Screening  Completed   Zoster (Shingles) Vaccine  Completed   HPV Vaccine  Aged Out    Advanced directives: (Copy Requested) Please bring a copy of your health care power of attorney and living will to the office to be added to your chart at your convenience.  Next Medicare Annual Wellness Visit scheduled for next year: Yes 03/27/2024 @ 8:10am telephone

## 2023-03-30 ENCOUNTER — Encounter: Payer: Self-pay | Admitting: Cardiovascular Disease

## 2023-03-30 ENCOUNTER — Other Ambulatory Visit: Payer: Self-pay | Admitting: Family Medicine

## 2023-03-30 ENCOUNTER — Telehealth: Payer: Self-pay | Admitting: Cardiovascular Disease

## 2023-03-30 DIAGNOSIS — R21 Rash and other nonspecific skin eruption: Secondary | ICD-10-CM

## 2023-03-30 NOTE — Telephone Encounter (Signed)
Patient following up.

## 2023-03-30 NOTE — Telephone Encounter (Signed)
Last office visit 07/14/2022 for neck pain.  Last refilled 11/15/2022 for 454 g with no refills.  Next Appt: CPE 04/13/2023.

## 2023-03-30 NOTE — Telephone Encounter (Signed)
Pt c/o BP issue: STAT if pt c/o blurred vision, one-sided weakness or slurred speech  1. What are your last 5 BP readings?   147/90 hr77, he states its been ranging around the same for a month   2. Are you having any other symptoms (ex. Dizziness, headache, blurred vision, passed out)? Migraines (may be related to other issues)   3. What is your BP issue?  Pt states his BP has been elevated and he asked if he can take 1-2 midodrines instead of all 3.    He also states Dr. Sherryll Burger wants him to start Aimovig injection for migraines and he wants to know if this is safe to take.

## 2023-03-30 NOTE — Telephone Encounter (Signed)
Dr Mariah Milling,  Pt called and it appears that his primary issue is the midodrine is causing high BP -- 147/90 HR 77.  Possibly causing headaches.   Two questions:  Can we change that midodrine to twice daily?  Also is it safe for pt to get migraine injection meds (Aimovig) from Dr Sherryll Burger?  Thanks, Danelle Earthly

## 2023-03-31 ENCOUNTER — Other Ambulatory Visit (INDEPENDENT_AMBULATORY_CARE_PROVIDER_SITE_OTHER): Payer: Medicare Other

## 2023-03-31 DIAGNOSIS — E039 Hypothyroidism, unspecified: Secondary | ICD-10-CM

## 2023-03-31 DIAGNOSIS — E782 Mixed hyperlipidemia: Secondary | ICD-10-CM | POA: Diagnosis not present

## 2023-03-31 DIAGNOSIS — G43719 Chronic migraine without aura, intractable, without status migrainosus: Secondary | ICD-10-CM | POA: Diagnosis not present

## 2023-03-31 LAB — LIPID PANEL
Cholesterol: 164 mg/dL (ref 0–200)
HDL: 62 mg/dL (ref >39.00)
LDL Cholesterol: 82 mg/dL (ref 0–99)
NonHDL: 102.13
Total CHOL/HDL Ratio: 3
Triglycerides: 99 mg/dL (ref 0.0–149.0)
VLDL: 19.8 mg/dL (ref 0.0–40.0)

## 2023-03-31 LAB — COMPREHENSIVE METABOLIC PANEL
ALT: 43 U/L (ref 0–53)
AST: 39 U/L — ABNORMAL HIGH (ref 0–37)
Albumin: 4.1 g/dL (ref 3.5–5.2)
Alkaline Phosphatase: 69 U/L (ref 39–117)
BUN: 24 mg/dL — ABNORMAL HIGH (ref 6–23)
CO2: 30 meq/L (ref 19–32)
Calcium: 9 mg/dL (ref 8.4–10.5)
Chloride: 103 meq/L (ref 96–112)
Creatinine, Ser: 1.43 mg/dL (ref 0.40–1.50)
GFR: 48.86 mL/min — ABNORMAL LOW (ref >60.00)
Glucose, Bld: 104 mg/dL — ABNORMAL HIGH (ref 70–99)
Potassium: 4.5 meq/L (ref 3.5–5.1)
Sodium: 140 meq/L (ref 135–145)
Total Bilirubin: 0.5 mg/dL (ref 0.2–1.2)
Total Protein: 6 g/dL (ref 6.0–8.3)

## 2023-03-31 LAB — T3, FREE: T3, Free: 2.2 pg/mL — ABNORMAL LOW (ref 2.3–4.2)

## 2023-03-31 LAB — T4, FREE: Free T4: 0.71 ng/dL (ref 0.60–1.60)

## 2023-03-31 LAB — TSH: TSH: 6.34 u[IU]/mL — ABNORMAL HIGH (ref 0.35–5.50)

## 2023-03-31 MED ORDER — MIDODRINE HCL 5 MG PO TABS: 5.0000 mg | ORAL_TABLET | Freq: Three times a day (TID) | ORAL | Status: DC

## 2023-03-31 NOTE — Progress Notes (Signed)
No critical labs need to be addressed urgently. We will discuss labs in detail at upcoming office visit.   

## 2023-03-31 NOTE — Telephone Encounter (Signed)
Called patient and notified him of the following from Dr. Mariah Milling.  Would recommend we decrease midodrine down to 5 mg from 10 mg 3 times daily If blood pressure continues to run high, next step would be 2.5 mg 3 times daily We can slowly wean down the midodrine, possibly stop the medication as blood pressure tolerates Thx TGollan        Patient verbalizes understanding.

## 2023-04-07 ENCOUNTER — Encounter: Payer: Medicare Other | Admitting: Family Medicine

## 2023-04-10 ENCOUNTER — Encounter: Payer: Self-pay | Admitting: Cardiovascular Disease

## 2023-04-10 NOTE — Telephone Encounter (Signed)
With improvement in blood pressure. We can stop it for now. Continue to monitor blood pressures at home and if they start trending down we can restart at the low dose one or twice daily until improvement in pressures.

## 2023-04-13 ENCOUNTER — Ambulatory Visit: Payer: Medicare Other | Admitting: Family Medicine

## 2023-04-13 ENCOUNTER — Encounter: Payer: Self-pay | Admitting: Family Medicine

## 2023-04-13 VITALS — BP 148/86 | HR 73 | Temp 97.7°F | Ht 69.0 in | Wt 139.0 lb

## 2023-04-13 DIAGNOSIS — I25118 Atherosclerotic heart disease of native coronary artery with other forms of angina pectoris: Secondary | ICD-10-CM

## 2023-04-13 DIAGNOSIS — F411 Generalized anxiety disorder: Secondary | ICD-10-CM | POA: Diagnosis not present

## 2023-04-13 DIAGNOSIS — N02B9 Other recurrent and persistent immunoglobulin A nephropathy: Secondary | ICD-10-CM | POA: Diagnosis not present

## 2023-04-13 DIAGNOSIS — F5104 Psychophysiologic insomnia: Secondary | ICD-10-CM | POA: Diagnosis not present

## 2023-04-13 DIAGNOSIS — E039 Hypothyroidism, unspecified: Secondary | ICD-10-CM

## 2023-04-13 DIAGNOSIS — D509 Iron deficiency anemia, unspecified: Secondary | ICD-10-CM

## 2023-04-13 DIAGNOSIS — E782 Mixed hyperlipidemia: Secondary | ICD-10-CM | POA: Diagnosis not present

## 2023-04-13 DIAGNOSIS — N1831 Chronic kidney disease, stage 3a: Secondary | ICD-10-CM

## 2023-04-13 DIAGNOSIS — I951 Orthostatic hypotension: Secondary | ICD-10-CM | POA: Diagnosis not present

## 2023-04-13 MED ORDER — METHOCARBAMOL 750 MG PO TABS: 750.0000 mg | ORAL_TABLET | Freq: Four times a day (QID) | ORAL | 0 refills | Status: DC

## 2023-04-13 NOTE — Assessment & Plan Note (Addendum)
 Chronic, no longer on midodrine given BP elevated since neck surgery.  Still high off midodrine... will get a new cuff given it looks good in office. If remaining high..will contact cards. Followed by cardiology.

## 2023-04-13 NOTE — Assessment & Plan Note (Signed)
 Stable, chronic.  Continue current medication. Levo 88 mcg daily   Re-eval in 1-2 months.[name] if persistently showing undertreatment will increase levothyroxine

## 2023-04-13 NOTE — Assessment & Plan Note (Signed)
Chronic, improved control on current regimen of Cymbalta 90 mg daily and Wellbutrin XL 300 mg daily

## 2023-04-13 NOTE — Progress Notes (Signed)
 Patient ID: Shawn Lam, male    DOB: 10-18-1950, 73 y.o.   MRN: [id number]  This visit was conducted in person.  BP (!) 148/86   Pulse 73   Temp 97.7 F (36.5 C) (Temporal)   Ht 5' 9 (1.753 m)   Wt 139 lb (63 kg)   SpO2 98%   BMI 20.53 kg/m    CC:  Chief Complaint  Patient presents with   Annual Exam    Subjective:   HPI: Shawn Lam is a 73 y.o. male presenting on 04/13/2023 for   The patient presents for  review of chronic health problems. He/She also has the following acute concerns today:    Neck surgery Dr. Onetha 3-4 weeks ago... pain across upper back and right shoulder.  Cervicogenic headache better.  Doing home PT.  Worst neck upper back pain is awakening each morning.  The patient saw a LPN or RN for medicare wellness visit. 03/27/2023  Prevention and wellness was reviewed in detail. Note reviewed and important notes copied below.   Sees Neuro for chronic headache and dizziness  Dr. Maree. Occipital nerve block 04/12/2021    Continued constipation.. has been treating with dulcolax.   OSA: Followed by pulmonary     Chronic insomnia: Stable control on Ambien ..10 mg nightly.     IG A nephropathy/CKD: Followed by Dr.  Lateef q 6 month OV.   Orthostatic hypotension...  Has been running high at home.. cards took him off midodrine  10 mg TID  BP still running high at home. 155/109 BP Readings from Last 3 Encounters:  04/13/23 (!) 148/86  03/21/23 (!) 169/98  03/14/23 (!) 148/82     CAD: followed by Dr. Gollan cardiology.    Hypothyroid  Slightly low T3, nml T4, elevated TSH  on levothyroxine  88 mcg daily Lab Results  Component Value Date   TSH 6.34 (H) 03/31/2023    Elevated Cholesterol: Good control at goal LDL < 70 on atorvastatin  40 mg daily Lab Results  Component Value Date   CHOL 164 03/31/2023   HDL 62.00 03/31/2023   LDLCALC 82 03/31/2023   LDLDIRECT 57 06/22/2009   TRIG 99.0 03/31/2023   CHOLHDL 3 03/31/2023  Using medications  without problems: Muscle aches: none Diet compliance: healthy eating Exercise: daily  walking/jogging  1 hour Other complaints:   MDD, GAD chronic insomnia : moderate control on max cymbalta  and  wellbutrin  XL 300 mg  Buspar  taking at 20 mg Three times daily ( max dose) He states that anxiety is still not well controlled and he would like to change up his medication.  He is not interested in referral to counseling.  Peripheral neuropathy  good control on gabapentin   100 mg 2 capsules BID     BPH followed by Dr. Alvaro. Stable on tamsulosin  and finasteride    Patient Care Team: Avelina Greig BRAVO, MD as PCP - General Gollan, Evalene PARAS, MD as PCP - Cardiology (Cardiology) Fate Morna SAILOR, [facility] as Pharmacist (Pharmacist)  Relevant past medical, surgical, family and social history reviewed and updated as indicated. Interim medical history since our last visit reviewed. Allergies and medications reviewed and updated. Outpatient Medications Prior to Visit  Medication Sig Dispense Refill   aspirin  EC 81 MG tablet Take 81 mg by mouth daily.     atorvastatin  (LIPITOR ) 80 MG tablet Take 1 tablet (80 mg total) by mouth daily. 90 tablet 3   buPROPion  (WELLBUTRIN  XL) 150 MG 24 hr tablet TAKE 2  TABLETS BY MOUTH DAILY 60 tablet 2   busPIRone  (BUSPAR ) 10 MG tablet TAKE 1 TABLET BY MOUTH 3 TIMES A DAY (Patient taking differently: Take 20 mg by mouth 3 (three) times daily.) 270 tablet 1   Cholecalciferol  (VITAMIN D3) 50 MCG (2000 UT) TABS Take 2,000 Units by mouth daily.     cyanocobalamin  (VITAMIN B12) 1000 MCG tablet Take 1,000 mcg by mouth daily.     denosumab  (PROLIA ) 60 MG/ML SOSY injection Inject 60 mg into the skin every 6 (six) months. Do not send RX, order this from the office     DULoxetine  (CYMBALTA ) 30 MG capsule TAKE THREE CAPSULES BY MOUTH DAILY 90 capsule 5   Elastic Bandages & Supports (ABDOMINAL BINDER/ELASTIC LARGE) MISC 1 Units by Does not apply route daily. 1 each 1   ferrous sulfate   325 (65 FE) MG tablet Take 325 mg by mouth daily with breakfast.     finasteride  (PROSCAR ) 5 MG tablet Take 5 mg by mouth daily.     levothyroxine  (SYNTHROID ) 88 MCG tablet TAKE 1 TABLET BY MOUTH DAILY BEFORE BREAKFAST 90 tablet 0   Magnesium  250 MG CAPS Take 250 mg by mouth daily.     Multiple Vitamins-Minerals (MULTIVITAMIN WITH MINERALS) tablet Take 1 tablet by mouth daily.     nitroGLYCERIN  (NITROSTAT ) 0.4 MG SL tablet Place 1 tablet (0.4 mg total) under the tongue every 5 (five) minutes as needed for chest pain. 10 tablet 1   primidone  (MYSOLINE ) 50 MG tablet Take 50 mg by mouth 2 (two) times daily.     sucralfate  (CARAFATE ) 1 g tablet TAKE 1 TABLET BY MOUTH 4 TIMES A DAY WITH MEALS AND AT BEDTIME 120 tablet 2   triamcinolone  cream (KENALOG ) 0.1 % APPLY TOPICALLY TWO TIMES A DAY AS NEEDED .DO NOT APPLY TO FACE, GROIN, UNDERARMS 454 g 0   zolpidem  (AMBIEN ) 10 MG tablet TAKE 1 TABLET BY MOUTH EVERY NIGHT AT BEDTIME AS NEEDED 30 tablet 0   HYDROcodone -acetaminophen  (NORCO/VICODIN) 5-325 MG tablet Take 2 tablets by mouth every 4 (four) hours as needed for severe pain (pain score 7-10). (Patient not taking: Reported on 04/13/2023) 30 tablet 0   midodrine  (PROAMATINE ) 5 MG tablet Take 1 tablet (5 mg total) by mouth 3 (three) times daily. (Patient not taking: Reported on 04/13/2023)     ondansetron  (ZOFRAN -ODT) 4 MG disintegrating tablet Take 1 tablet (4 mg total) by mouth every 6 (six) hours as needed for nausea or vomiting. (Patient not taking: Reported on 04/13/2023) 20 tablet 0   methocarbamol  (ROBAXIN -750) 750 MG tablet Take 1 tablet (750 mg total) by mouth 4 (four) times daily. (Patient not taking: Reported on 04/13/2023) 45 tablet 0   No facility-administered medications prior to visit.     Per HPI unless specifically indicated in ROS section below Review of Systems  Constitutional:  Negative for fatigue and fever.  HENT:  Negative for ear pain.   Eyes:  Negative for pain.  Respiratory:  Negative  for cough and shortness of breath.   Cardiovascular:  Negative for chest pain, palpitations and leg swelling.  Gastrointestinal:  Negative for abdominal pain.  Genitourinary:  Negative for dysuria.  Musculoskeletal:  Negative for arthralgias.  Neurological:  Negative for syncope, light-headedness and headaches.  Psychiatric/Behavioral:  Negative for dysphoric mood.    Objective:  BP (!) 148/86   Pulse 73   Temp 97.7 F (36.5 C) (Temporal)   Ht 5' 9 (1.753 m)   Wt 139 lb (63 kg)  SpO2 98%   BMI 20.53 kg/m   Wt Readings from Last 3 Encounters:  04/13/23 139 lb (63 kg)  03/27/23 135 lb (61.2 kg)  03/21/23 140 lb (63.5 kg)      Physical Exam Constitutional:      Appearance: He is well-developed.  HENT:     Head: Normocephalic.     Right Ear: Hearing normal.     Left Ear: Hearing normal.     Nose: Nose normal.  Neck:     Thyroid : No thyroid  mass or thyromegaly.     Vascular: No carotid bruit.     Trachea: Trachea normal.  Cardiovascular:     Rate and Rhythm: Normal rate and regular rhythm.     Pulses: Normal pulses.     Heart sounds: Heart sounds not distant. No murmur heard.    No friction rub. No gallop.     Comments: No peripheral edema Pulmonary:     Effort: Pulmonary effort is normal. No respiratory distress.     Breath sounds: Normal breath sounds.  Musculoskeletal:     Comments: Ttp over rigth trapezius, negative Spurling, decrease ROM at neck  Skin:    General: Skin is warm and dry.     Findings: No rash.  Psychiatric:        Speech: Speech normal.        Behavior: Behavior normal.        Thought Content: Thought content normal.       Results for orders placed or performed in visit on 03/31/23  T3, free   Collection Time: 03/31/23  7:28 AM  Result Value Ref Range   T3, Free 2.2 (L) 2.3 - 4.2 pg/mL  T4, free   Collection Time: 03/31/23  7:28 AM  Result Value Ref Range   Free T4 0.71 0.60 - 1.60 ng/dL  TSH   Collection Time: 03/31/23  7:28 AM   Result Value Ref Range   TSH 6.34 (H) 0.35 - 5.50 uIU/mL  Lipid panel   Collection Time: 03/31/23  7:28 AM  Result Value Ref Range   Cholesterol 164 0 - 200 mg/dL   Triglycerides 00.9 0.0 - 149.0 mg/dL   HDL 37.99 >60.99 mg/dL   VLDL 80.1 0.0 - 59.9 mg/dL   LDL Cholesterol 82 0 - 99 mg/dL   Total CHOL/HDL Ratio 3    NonHDL 102.13   Comprehensive metabolic panel   Collection Time: 03/31/23  7:28 AM  Result Value Ref Range   Sodium 140 135 - 145 mEq/L   Potassium 4.5 3.5 - 5.1 mEq/L   Chloride 103 96 - 112 mEq/L   CO2 30 19 - 32 mEq/L   Glucose, Bld 104 (H) 70 - 99 mg/dL   BUN 24 (H) 6 - 23 mg/dL   Creatinine, Ser 8.56 0.40 - 1.50 mg/dL   Total Bilirubin 0.5 0.2 - 1.2 mg/dL   Alkaline Phosphatase 69 39 - 117 U/L   AST 39 (H) 0 - 37 U/L   ALT 43 0 - 53 U/L   Total Protein 6.0 6.0 - 8.3 g/dL   Albumin 4.1 3.5 - 5.2 g/dL   GFR 51.13 (L) >39.99 mL/min   Calcium  9.0 8.4 - 10.5 mg/dL   *Note: Due to a large number of results and/or encounters for the requested time period, some results have not been displayed. A complete set of results can be found in Results Review.     COVID 19 screen:  No recent travel or known exposure  to COVID19 The patient denies respiratory symptoms of COVID 19 at this time. The importance of social distancing was discussed today.   Assessment and Plan The patient's preventative maintenance and recommended screening tests for an annual wellness exam were reviewed in full today. Brought up to date unless services declined.  Counselled on the importance of diet, exercise, and its role in overall health and mortality. The patient's FH and SH was reviewed, including their home life, tobacco status, and drug and alcohol status.   Vaccines: Uptodate flu  and tdap  S/P COVID x 3, completed shingrix and COVID booster. Nonsmoker    Prostate cancer screening no longer indicated. Colon cancer screening: Colonoscopy 10/19/2021 nml, repeat in 5 years. Hep C: done   HIV: neg test  Osteoporosis:  declining over last 2 years on fosamax ..  changed to Prolia ,  started 2022.SABRA Last DEXA 05/2022   Problem List Items Addressed This Visit     CAD (coronary artery disease) (Chronic)    Followed by Cardiologist.      Chronic insomnia (Chronic)   Stable control on Ambien ...       CKD (chronic kidney disease), stage IIIa (Chronic)   Chronic, slight worsening, followed by renal.      Generalized anxiety disorder (Chronic)   Chronic, improved control on current regimen of Cymbalta  90 mg daily and Wellbutrin  XL 300 mg daily      HLD (hyperlipidemia) - Primary (Chronic)   Chronic,  good control on atorvastatin  40 mg p.o. daily in past, slight worsening. Will work on low chol diet and recheck in 3 months.  Statin is likely the cause of slight increase in AST ( improved this year).  Will follow over time      Relevant Orders   Comprehensive metabolic panel   Lipid panel   Hypothyroidism (Chronic)   Stable, chronic.  Continue current medication. Levo 88 mcg daily   Re-eval in 1-2 months.SABRA if persistently showing undertreatment will increase levothyroxine       Relevant Orders   TSH   T4, free   T3, free   IgA nephropathy (Chronic)   Chronic, followed by renal.  Slight worsening in renal function on recent labs.  Encouraged water  intake.  He continues to avoid NSAIDs.      Iron deficiency anemia (Chronic)    Chronic, now resolved. HG in nml range      Relevant Orders   IBC + Ferritin   Orthostatic hypotension    Chronic, no longer on midodrine  given BP elevated since neck surgery.  Still high off midodrine ... will get a new cuff given it looks good in office. If remaining high..will contact cards. Followed by cardiology.         Greig Ring, MD

## 2023-04-13 NOTE — Assessment & Plan Note (Signed)
 Followed by Cardiologist.

## 2023-04-13 NOTE — Assessment & Plan Note (Signed)
 Stable control on Ambien.Shawn KitchenMarland Lam

## 2023-04-13 NOTE — Assessment & Plan Note (Signed)
 Chronic, now resolved. HG in nml range

## 2023-04-13 NOTE — Assessment & Plan Note (Addendum)
 Chronic,  good control on atorvastatin 40 mg p.o. daily in past, slight worsening. Will work on low chol diet and recheck in 3 months.  Statin is likely the cause of slight increase in AST ( improved this year).  Will follow over time

## 2023-04-13 NOTE — Assessment & Plan Note (Signed)
Chronic, followed by renal.  Slight worsening in renal function on recent labs.  Encouraged water intake.  He continues to avoid NSAIDs.

## 2023-04-13 NOTE — Assessment & Plan Note (Signed)
 Chronic, slight worsening, followed by renal.

## 2023-04-13 NOTE — Patient Instructions (Addendum)
 Space tylenol  to 2 tabs 3 times daily as needed for pain.  Can use  muscle relaxant as lon gas it is not oversedating.  If taking at bedtime, hold or half Ambien .  Work on low cholesterol diet, keep up with walking.  Follow BP, consider new cuff... if remaining high contact cardiology.

## 2023-04-15 ENCOUNTER — Other Ambulatory Visit: Payer: Self-pay | Admitting: Family Medicine

## 2023-04-18 ENCOUNTER — Encounter: Payer: Self-pay | Admitting: Cardiovascular Disease

## 2023-04-19 NOTE — Telephone Encounter (Signed)
 It may be better to go ahead and come back in prior to seeing vascular. We don't want the blood pressure to remain too high or low. We can have scheduling more your appointment up.

## 2023-04-25 ENCOUNTER — Encounter: Payer: Self-pay | Admitting: Family Medicine

## 2023-04-26 ENCOUNTER — Other Ambulatory Visit: Payer: Self-pay | Admitting: Family Medicine

## 2023-04-26 NOTE — Telephone Encounter (Signed)
 Last office visit 04/13/2023 for CPE.  Last refilled 03/23/2023 for #30 with no refills.  Next Appt: CPE 04/16/24.

## 2023-04-27 DIAGNOSIS — M542 Cervicalgia: Secondary | ICD-10-CM | POA: Diagnosis not present

## 2023-04-27 DIAGNOSIS — M5412 Radiculopathy, cervical region: Secondary | ICD-10-CM | POA: Diagnosis not present

## 2023-05-02 ENCOUNTER — Ambulatory Visit: Payer: Medicare Other | Admitting: Cardiology

## 2023-05-02 DIAGNOSIS — N2581 Secondary hyperparathyroidism of renal origin: Secondary | ICD-10-CM | POA: Diagnosis not present

## 2023-05-02 DIAGNOSIS — R809 Proteinuria, unspecified: Secondary | ICD-10-CM | POA: Diagnosis not present

## 2023-05-02 DIAGNOSIS — N1831 Chronic kidney disease, stage 3a: Secondary | ICD-10-CM | POA: Diagnosis not present

## 2023-05-02 DIAGNOSIS — D631 Anemia in chronic kidney disease: Secondary | ICD-10-CM | POA: Diagnosis not present

## 2023-05-09 ENCOUNTER — Encounter: Payer: Self-pay | Admitting: Cardiology

## 2023-05-09 ENCOUNTER — Ambulatory Visit: Payer: Medicare Other | Attending: Cardiology | Admitting: Cardiology

## 2023-05-09 VITALS — BP 158/79 | HR 79 | Ht 72.0 in | Wt 135.4 lb

## 2023-05-09 DIAGNOSIS — I951 Orthostatic hypotension: Secondary | ICD-10-CM | POA: Insufficient documentation

## 2023-05-09 DIAGNOSIS — G4733 Obstructive sleep apnea (adult) (pediatric): Secondary | ICD-10-CM | POA: Insufficient documentation

## 2023-05-09 DIAGNOSIS — I25118 Atherosclerotic heart disease of native coronary artery with other forms of angina pectoris: Secondary | ICD-10-CM | POA: Insufficient documentation

## 2023-05-09 DIAGNOSIS — E782 Mixed hyperlipidemia: Secondary | ICD-10-CM | POA: Insufficient documentation

## 2023-05-09 DIAGNOSIS — N183 Chronic kidney disease, stage 3 unspecified: Secondary | ICD-10-CM | POA: Diagnosis not present

## 2023-05-09 DIAGNOSIS — I10 Essential (primary) hypertension: Secondary | ICD-10-CM | POA: Diagnosis not present

## 2023-05-09 MED ORDER — LOSARTAN POTASSIUM 25 MG PO TABS: 12.5000 mg | ORAL_TABLET | Freq: Every day | ORAL | 0 refills | Status: DC

## 2023-05-09 NOTE — Patient Instructions (Signed)
Medication Instructions:   Your physician has recommended you make the following change in your medication:   Continue- Clopidogrel 75 mg tablet- Take 1 tablet by mouth once daily.  START- Losartan 12.5 mg tablet- Take 1 tablet by mouth once daily.  *If you need a refill on your cardiac medications before your next appointment, please call your pharmacy*   Lab Work:  Your provider would like for you to return in 2 weeks to have the following labs drawn: BMET.   Please go to [facility] [address] ([facility] [address], [place] [address] You do not need an appointment.  They are open from 8 am- 4:30 pm.  Lunch from 1:00 pm- 2:00 pm   You may also go to one of the following [facility]:  [address] [place], [place] [address] Phone: [phone] Lab hours: Mon-Fri 8 am- 5 pm    Lunch 12 pm- 1 pm  [address] [place],  [place]  [address]  [place] Phone: [phone] Lab hours: 7 am- 4 pm Lunch 12 pm-1 pm   [address] [place],  [place]  [address]  [place] Phone: [phone] Lab hours: Mon-Fri 8 am- 5 pm    Lunch 12 pm- 1 pm  If you have labs (blood work) drawn today and your tests are completely normal, you will receive your results only by: MyChart Message (if you have MyChart) OR A paper copy in the mail If you have any lab test that is abnormal or we need to change your treatment, we will call you to review the results.   Testing/Procedures:  NONE TODAY   Follow-Up: At [facility], you and your health needs are our priority.  As part of our continuing mission to provide you with exceptional heart care, we have created designated Provider Care Teams.  These Care Teams include your primary Cardiologist (physician) and Advanced Practice Providers (APPs -  Physician Assistants and Nurse Practitioners) who all work together to provide you with the care you need, when you need it.  We recommend signing up for the patient portal called  "MyChart".  Sign up information is provided on this After Visit Summary.  MyChart is used to connect with patients for Virtual Visits (Telemedicine).  Patients are able to view lab/test results, encounter notes, upcoming appointments, etc.  Non-urgent messages can be sent to your provider as well.   To learn more about what you can do with MyChart, go to [email or web address].    Your next appointment:   6 week(s)  Provider:   You may see Julien Nordmann, MD or one of the following Advanced Practice Providers on your designated Care Team:   Nicolasa Ducking, NP Eula Listen, PA-C Cadence Fransico Michael, PA-C Charlsie Quest, NP Carlos Levering, NP

## 2023-05-09 NOTE — Progress Notes (Signed)
Cardiology Office Note:  .   Date:  05/09/2023  ID:  Shawn Lam, DOB May 01, 1950, MRN [id number] PCP: Excell Seltzer, MD  [facility] Providers Cardiologist:  Julien Nordmann, MD    History of Present Illness: .   Shawn Lam is a 73 y.o. male with a past medical history of coronary artery disease status post PCI to the RCA, recurrent syncope secondary to orthostatic hypotension, frequent PVCs, hyperlipidemia, mildly dilated aortic root, CKD stage III, iron deficiency anemia, hypothyroidism, OSA status post inspire device, and lymphedema who presents today for follow-up after recent emergency department evaluation.   Cardiac MRI 10/2013 demonstrated normal LVEF and RVEF with no evidence of LGE.  He underwent loop recorder implantation in 05/2016 with device interrogation showed no evidence of significant arrhythmia, prolonged pauses, or high-grade AV block.  Lexiscan MPI completed in 11/2018 showed no evidence of ischemia and was overall low risk with an EF of 55 to 65%.  Echocardiogram from 11/2018 demonstrated an EF of 60-65%, diastolic dysfunction, no RWMA, normal RV systolic function, and aortic valve to be normal, no significant valvular abnormalities.  He was seen in the office 04/2021 and noted a syncopal episode around Christmas in 2022 associated with standing.  He also reported palpitations while lying down at night.  He remained on midodrine 10 mg 3 times daily.  He was evaluated again in April 2023 for recurrent syncope and preoperative cardiac risk stratification for TURP.  Symptoms of syncope continued in the context of positional dizziness.  He was without symptoms of angina or decompensation.  He underwent repeat event monitoring which demonstrated predominant rhythm was sinus with an average heart rate of 60 bpm, 11 episodes of SVT frequent PVCs with a burden of 8.4, given PVC burden in the context of significant orthostasis he was referred to EP.  Repeat echocardiogram  demonstrated EF of 50%, global hypokinesis, normal RV systolic function, and estimated right atrial pressure of 15 mmHg.  EP evaluation was completed 08/28/2021 with a conservative approach recommended given the burden of less than 10% PVCs.  Subsequent Lexiscan MPI in 09/2021 showed no evidence of ischemia.  Echo 1 month prior demonstrated preserved LV systolic function, LAD and RCA calcifications were noted on CT attenuation corrected images but overall was considered a low risk study.   He was last seen in clinic 03/07/2023 stating that overall he been doing well from a cardiac perspective.  Blood pressure been stable on monitoring.  He had chronic shortness of breath been unchanged for the last 6 months.  Continues to have some weight loss of unknown origin.  Had been scheduled for an ACDF C4-C5 and C5-C6 procedure.  There were no medication changes that were made and no further testing that was ordered at the time.  He was admitted 03/13/2023 for cervical spondylosis with radiculopathy further C4-5 and C5-6.  He tolerated surgery well and was taken to the recovery room in the floor in stable condition.  Hospital course was without complications.  Dressing remained clean dry and intact.  Considered stable for discharge and was discharged on 03/14/2023.  He was evaluated at [facility] emergency department on 03/21/2023 with complaint of headache.  He been experiencing for last 2 to 3 days.  Blood pressure was suboptimally controlled at 153/101 and 169/98.  He was treated with fentanyl, Zofran, and normal saline. Imaging revealed no evidence of injury.He was sent home with a Zofran prescription.   He called into the triage line with elevated  blood pressures.  Midodrine has been weaned off after slightly decreased the dose to continue to have blood related blood pressures.  Appointment was made for him to follow-up about blood pressure.  He returns to clinic today stating that he feels well.He denies any chest  pain, shortness of breath, palpitations, or peripheral edema.  He was started on a new medication for his migraines from neurology and that he states this helped with his headaches.  He stated that he followed up with nephrology and since being off of midodrine with his pressure going up [name] had asked him about starting losartan and he wanted to follow-up in office before starting any new medications.  He also stated when he was evaluated in the emergency department a CT of his neck and that was completed and was advised that he had headache carotid stenosis and needed follow-up with vascular.  ROS: 10 point review of systems has been reviewed and considered negative except what is been listed in the HPI  Studies Reviewed: [name]        Lexiscan MPI 09/22/2021:   The study is low risk.   There is no evidence of ischemia   Left ventricular function visually appears normal. Calculated EF is 39%. correlation with echo advised   LAD and RCA calcifications noted   Image quality degraded by diaphragmatic artifacts.   2D echo 08/19/2021: 1. Left ventricular ejection fraction, by estimation, is 50% . The left  ventricle has low normal function. The left ventricle demonstrates global  hypokinesis.   2. Right ventricular systolic function is normal. The right ventricular  size is normal. There is normal pulmonary artery systolic pressure. The  estimated right ventricular systolic pressure is 35.2 mmHg.   3. The mitral valve is normal in structure. Mild mitral valve  regurgitation. No evidence of mitral stenosis.   4. The aortic valve was not well visualized. Aortic valve regurgitation  is not visualized. No aortic stenosis is present.   5. The inferior vena cava is dilated in size with <50% respiratory  variability, suggesting right atrial pressure of 15 mmHg.   6. PVCs noted   Comparison(s): EF 60-65%.   Zio patch 07/2021: Normal sinus rhythm Patient had a min HR of 40 bpm, max HR of 193 bpm,  and avg HR of 68 bpm.    11 Supraventricular Tachycardia runs occurred, the run with the fastest interval lasting 4 beats with a max rate of 193 bpm, the longest lasting 15 beats with an avg rate of 111 bpm.  Junctional Rhythm was present.   IIsolated SVEs were occasional (1.7%, [id number]), SVE Couplets were rare (<1.0%, 72), and SVE Triplets were rare (<1.0%, 11). Isolated VEs were frequent (8.4%, [id number]), VE Couplets were rare (<1.0%, 8), and VE Triplets were rare (<1.0%, 1). Ventricular Bigeminy and Trigeminy were present.   Triggered events associated with PVCs   2D echo 11/30/2018: 1. The left ventricle has normal systolic function with an ejection  fraction of 60-65%. The cavity size was normal. Left ventricular diastolic  Doppler parameters are consistent with impaired relaxation. No evidence of  left ventricular regional wall  motion abnormalities.   2. The right ventricle has normal systolic function. The cavity was  normal. There is no increase in right ventricular wall thickness. Right  ventricular systolic pressure is normal with an estimated pressure of 21.3  mmHg.   Lexiscan MPI 11/29/2018: There was no ST segment deviation noted during stress. No T wave inversion was noted during  stress. The study is normal. This is a low risk study. The left ventricular ejection fraction is normal (55-65%).   2D echo 07/05/2017: - Left ventricle: The cavity size was normal. Wall thickness was    increased in a pattern of mild LVH. Systolic function was normal.    The estimated ejection fraction was in the range of 60% to 65%.    Wall motion was normal; there were no regional wall motion    abnormalities. Doppler parameters are consistent with abnormal    left ventricular relaxation (grade 1 diastolic dysfunction). The    E/e&' ratio is <8, suggesting normal LV filling pressure.  - Aorta: The sinus of valsalva is dilated at 4.0 cm.  - Left atrium: The atrium was normal in size.  - Right  atrium: The atrium was at the upper limits of normal in    size.  - Tricuspid valve: There was trivial regurgitation.  - Pulmonary arteries: PA peak pressure: 22 mm Hg (S).  - Inferior vena cava: The vessel was normal in size. The    respirophasic diameter changes were in the normal range (= 50%),    consistent with normal central venous pressure.   Impressions:   - Compared to a prior study in 06/2016, the LVEF is higher at    60-65%. The aorta is dilated to 4.0 cm at the sinus of valsalva,    but measures 3.5 cm at the aortic root.   2D echo 06/24/2016: - Left ventricle: The cavity size was normal. There was mild    concentric hypertrophy. Systolic function was normal. The    estimated ejection fraction was in the range of 50% to 55%. There    is hypokinesis of the entireinferior myocardium. Left ventricular    diastolic function parameters were normal.  - Aortic valve: Trileaflet; mildly thickened, mildly calcified    leaflets.  - Aorta: Aortic root dimension: 38 mm (ED).  - Aortic root: The aortic root was mildly dilated.  - Mitral valve: Calcified annulus as well as calcified papillatry    muscle There was trivial regurgitation.  - Tricuspid valve: There was trivial regurgitation.   Outpatient cardiac monitoring 03/2016: Normal SInus Rhythm with the average heart rate 68bpm. Occasional ventricular couplets   Nuclear stress test 02/23/2016: Nuclear stress EF: 42%. Blood pressure demonstrated a normal response to exercise. There was no ST segment deviation noted during stress. Defect 1: There is a medium defect of moderate severity present in the basal inferior and mid inferior location. The left ventricular ejection fraction is moderately decreased (30-44%). This is a low risk study.   Low risk stress nuclear study with inferior thinning vs prior infarct; no ischemia; EF 42 with global hypokinesis and moderate LVE.   Cardiac MRI 11/04/2013: IMPRESSION:  1.  Normal LV size  and systolic function, EF 59%. 2.  Normal RV size and systolic function. 3. No myocardial delayed enhancement noted, so no definitive  evidence for prior MI, myocarditis, or infiltrative disease.   2D echo 10/07/2013: - Left ventricle: The cavity size was normal. Wall thickness was    normal. Systolic function was moderately reduced. The estimated    ejection fraction was in the range of 35% to 40%. Moderate    hypokinesis of the entire myocardium. Risk Assessment/Calculations:     HYPERTENSION CONTROL Vitals:   05/09/23 1418 05/09/23 1444  BP: (!) 171/92 (!) 158/79    The patient's blood pressure is elevated above target today.  In order to  address the patient's elevated BP: A new medication was prescribed today.          Physical Exam:   VS:  BP (!) 158/79 (BP Location: Left Arm, Patient Position: Sitting, Cuff Size: Normal)   Pulse 79   Ht 6' (1.829 m)   Wt 135 lb 6.4 oz (61.4 kg)   SpO2 99%   BMI 18.36 kg/m    Wt Readings from Last 3 Encounters:  05/09/23 135 lb 6.4 oz (61.4 kg)  04/13/23 139 lb (63 kg)  03/27/23 135 lb (61.2 kg)    GEN: Well nourished, well developed in no acute distress NECK: No JVD; No carotid bruits CARDIAC: RRR, no murmurs, rubs, gallops RESPIRATORY:  Clear to auscultation without rales, wheezing or rhonchi  ABDOMEN: Soft, non-tender, non-distended EXTREMITIES:  No edema; No deformity   ASSESSMENT AND PLAN: .   Coronary artery disease with prior PCI to the LAD and RCA. Denies any symptoms of angina. He is continued on ASA 81 mg daily, Plavix 75 mg daily, and atorvastatin 80 mg daily. No further ischemic testing needed at this time.   History of orthostatic hypotension with hypertension with blood pressure today 170/92 on recheck of 158/79.  Previously with elevated blood pressures he had been weaned off of midodrine.  Continues to monitor his blood pressure at home and blood pressures around 160 systolic.  He is being started on losartan 12.5 mg  daily with recommendation to monitor his pressures 1 to 2 hours postmedication administration.  He has been scheduled for BMP in 2 weeks to reassess kidney function after restarting antihypertensive medication.  Mixed hyperlipidemia where his LDLs remain at goal.  He is continued on atorvastatin 80 mg daily  CKD stage III with last serum creatinine 1.36.  Kidney function has remained stable.  He continues to follow with nephrology.  Obstructive sleep apnea where he is intolerant to CPAP.  Inspire device implanted 2019.  No reoccurrence of symptoms.       Dispo: Return to clinic to see MD/APP in 6 weeks or sooner if needed for reevaluation of blood pressure.  Signed, Luvena Wentling, NP

## 2023-05-10 ENCOUNTER — Telehealth: Payer: Self-pay | Admitting: Cardiovascular Disease

## 2023-05-10 MED ORDER — LOSARTAN POTASSIUM 25 MG PO TABS: 25.0000 mg | ORAL_TABLET | Freq: Every day | ORAL | Status: DC

## 2023-05-10 NOTE — Telephone Encounter (Signed)
Pt made aware and verbalized understanding.

## 2023-05-10 NOTE — Telephone Encounter (Signed)
The patient called requesting if he can take the 25 mg losartan tablet whole instead of cutting it to 12.5 mg (half tablet). He reported that the pills are too small for him to cut, even with a pill cutter. The patient also mentioned that his systolic blood pressure has been consistently around the 170s.  Will forward to NP for recommendations

## 2023-05-10 NOTE — Telephone Encounter (Signed)
Patient called and said that his medication losartan is too small to cut in half to 12.5 MG. Wants to know if he could just take the whole 25 MG

## 2023-05-10 NOTE — Telephone Encounter (Signed)
Yes he can take 25 mg daily. With his history of orthostatic hypotension he was just recently weaned off of midodrine. Encouraged to continue to monitor his blood pressure 1 to 2 hours after he takes his medication.

## 2023-05-11 DIAGNOSIS — I6529 Occlusion and stenosis of unspecified carotid artery: Secondary | ICD-10-CM | POA: Insufficient documentation

## 2023-05-11 NOTE — Progress Notes (Unsigned)
MRN : [id number]  ISSAAC Lam is a 73 y.o. (Dec 22, 1950) male who presents with chief complaint of check carotid arteries.  History of Present Illness:   The patient is seen for evaluation of carotid stenosis. The carotid stenosis was identified after CT angio of the head and neck dated 03/21/2023.  CT dated 03/21/2023 is reviewed by me and shows 40% stenosis in the distal right bulb and proximal right internal carotid artery.  There is less than 10% stenosis noted on the left side  The patient denies amaurosis fugax. There is no recent history of TIA symptoms or focal motor deficits. There is no prior documented CVA.  There is no history of migraine headaches. There is no history of seizures.  The patient is taking enteric-coated aspirin 81 mg daily.  No recent shortening of the patient's walking distance or new symptoms consistent with claudication.  No history of rest pain symptoms. No new ulcers or wounds of the lower extremities have occurred.  There is no history of DVT, PE or superficial thrombophlebitis. No recent episodes of angina or shortness of breath documented.   Duplex ultrasound of the carotid arteries dated Aug 22, 2022 is reviewed by me and demonstrates mild heterogeneous plaque noted in the carotid artery bifurcations bilaterally.  No evidence for hemodynamically significant stenosis.  No outpatient medications have been marked as taking for the 05/15/23 encounter (Appointment) with Gilda Crease, Latina Craver, MD.    Past Medical History:  Diagnosis Date   Actinic keratosis 02/04/2021   right lateral neck   Anemia    Anxiety    Asymptomatic LV dysfunction    50-55% by echo 06/2016   Berger's disease    CAD (coronary artery disease) 06/2005   PCI of LAD and RCA   Depression    Dilated aortic root (HCC)    4cm at sinus of Valsalva   Hematuria    Hyperlipidemia    Hypothyroidism    Insomnia    Left ureteral stone    Orthostatic  hypotension    negative tilt table   OSA (obstructive sleep apnea)    moderate with AHI 17/hr, uses CPAP nightly(01/15/19 - has Inspire implant)   PVC's (premature ventricular contractions) 07/07/2016   Renal disorder    PT IS SEEING DR. Eliott Nine- NEPHROLOGIST   Rosacea    Urticaria     Past Surgical History:  Procedure Laterality Date   ANTERIOR CERVICAL DECOMP/DISCECTOMY FUSION N/A 03/13/2023   Procedure: CERVICAL FOUR-FIVE, CERVICAL FIVE-SIX ANTERIOR CERVICAL DISCECTOMY/DECOMPRESSION FUSION;  Surgeon: Donalee Citrin, MD;  Location: [facility] OR;  Service: Neurosurgery;  Laterality: N/A;  3C   CARDIAC CATHETERIZATION  09/06/2013   [facility]   CARDIAC CATHETERIZATION     COLONOSCOPY WITH PROPOFOL N/A 01/21/2019   Procedure: COLONOSCOPY WITH PROPOFOL;  Surgeon: Midge Minium, MD;  Location: [facility];  Service: Endoscopy;  Laterality: N/A;  sleep apnea   COLONOSCOPY WITH PROPOFOL N/A 10/19/2021   Procedure: COLONOSCOPY WITH PROPOFOL;  Surgeon: Midge Minium, MD;  Location: [facility] ENDOSCOPY;  Service: Endoscopy;  Laterality: N/A;   CORONARY ANGIOPLASTY  2004   STENT PLACEMENT   CYSTOSCOPY WITH RETROGRADE PYELOGRAM, URETEROSCOPY AND STENT PLACEMENT Left 03/19/2014   Procedure: CYSTOSCOPY WITH RETROGRADE PYELOGRAM, URETEROSCOPY AND STENT PLACEMENT;  Surgeon: Loletta Parish, MD;  Location: [facility] ORS;  Service: Urology;  Laterality: Left;   CYSTOSCOPY WITH  RETROGRADE PYELOGRAM, URETEROSCOPY AND STENT PLACEMENT Bilateral 05/20/2015   Procedure:  CYSTOSCOPY WITH BILATERAL RETROGRADE PYELOGRAM,RIGHT  DIAGNOSTIC URETEROSCOPY ,LEFT URETEROSCOPY WITH HOLMIUM LASER  AND BILATERAL  STENT PLACEMENT ;  Surgeon: Sebastian Ache, MD;  Location: [facility] ORS;  Service: Urology;  Laterality: Bilateral;   DRUG INDUCED ENDOSCOPY N/A 10/20/2017   Procedure: DRUG INDUCED SLEEP ENDOSCOPY;  Surgeon: Christia Reading, MD;  Location:  SURGERY CENTER;  Service: ENT;  Laterality: N/A;   EP IMPLANTABLE DEVICE N/A 05/13/2016    Procedure: Loop Recorder Insertion;  Surgeon: Duke Salvia, MD;  Location: [facility] INVASIVE CV LAB;  Service: Cardiovascular;  Laterality: N/A;   ESOPHAGOGASTRODUODENOSCOPY N/A 10/19/2021   Procedure: ESOPHAGOGASTRODUODENOSCOPY (EGD);  Surgeon: Midge Minium, MD;  Location: [facility] ENDOSCOPY;  Service: Endoscopy;  Laterality: N/A;   ESOPHAGOGASTRODUODENOSCOPY (EGD) WITH PROPOFOL N/A 11/30/2018   Procedure: ESOPHAGOGASTRODUODENOSCOPY (EGD) WITH PROPOFOL;  Surgeon: Wyline Mood, MD;  Location: [facility] ENDOSCOPY;  Service: Gastroenterology;  Laterality: N/A;   ESOPHAGOGASTRODUODENOSCOPY (EGD) WITH PROPOFOL N/A 12/01/2018   Procedure: ESOPHAGOGASTRODUODENOSCOPY (EGD) WITH PROPOFOL;  Surgeon: Midge Minium, MD;  Location: [facility] ENDOSCOPY;  Service: Endoscopy;  Laterality: N/A;   HOLMIUM LASER APPLICATION Bilateral 05/20/2015   Procedure: HOLMIUM LASER APPLICATION;  Surgeon: Sebastian Ache, MD;  Location: [facility] ORS;  Service: Urology;  Laterality: Bilateral;   IMPLIMENTATION OF A HYPOGLOSSAL NERVE STIMULATOR  12/08/2017   OSA    LEFT HEART CATHETERIZATION WITH CORONARY ANGIOGRAM N/A 09/06/2013   Procedure: LEFT HEART CATHETERIZATION WITH CORONARY ANGIOGRAM;  Surgeon: Lesleigh Noe, MD;  Location: [facility] CATH LAB;  Service: Cardiovascular;  Laterality: N/A;   LEFT KNEE CAP  SURGERY ABOUT 40 YRS AGO  ? 1970     STONE EXTRACTION WITH BASKET Left 03/19/2014   Procedure: STONE EXTRACTION WITH BASKET;  Surgeon: Loletta Parish, MD;  Location: [facility] ORS;  Service: Urology;  Laterality: Left;   TRANSURETHRAL RESECTION OF PROSTATE N/A 08/25/2021   Procedure: TRANSURETHRAL RESECTION OF THE PROSTATE (TURP);  Surgeon: Sebastian Ache, MD;  Location: [facility] ORS;  Service: Urology;  Laterality: N/A;  1 HR    Social History Social History   Tobacco Use   Smoking status: Never   Smokeless tobacco: Never  Vaping Use   Vaping status: Never Used  Substance Use Topics   Alcohol use: Not Currently    Alcohol/week: 1.0 standard drink of  alcohol    Types: 1 Cans of beer per week   Drug use: No    Family History Family History  Problem Relation Age of Onset   Coronary artery disease Mother    Heart attack Mother    Heart disease Mother    Hypertension Mother    Cancer Father        lung and colon   Lung cancer Father        smoker   Diabetes Brother     Allergies  Allergen Reactions   Codeine Nausea Only and Nausea And Vomiting   Tape Other (See Comments)    Use only paper tape. Severe blistering and bruising with other tapes. No cloth tape.     REVIEW OF SYSTEMS (Negative unless checked)  Constitutional: [] Weight loss  [] Fever  [] Chills Cardiac: [] Chest pain   [] Chest pressure   [] Palpitations   [] Shortness of breath when laying flat   [] Shortness of breath with exertion. Vascular:  [x] Pain in legs with walking   [] Pain in legs at rest  [] History of DVT   [] Phlebitis   [] Swelling in legs   []   Varicose veins   [] Non-healing ulcers Pulmonary:   [] Uses home oxygen   [] Productive cough   [] Hemoptysis   [] Wheeze  [] COPD   [] Asthma Neurologic:  [] Dizziness   [] Seizures   [] History of stroke   [] History of TIA  [] Aphasia   [] Vissual changes   [] Weakness or numbness in arm   [] Weakness or numbness in leg Musculoskeletal:   [] Joint swelling   [x] Joint pain   [] Low back pain Hematologic:  [] Easy bruising  [] Easy bleeding   [] Hypercoagulable state   [] Anemic Gastrointestinal:  [] Diarrhea   [] Vomiting  [x] Gastroesophageal reflux/heartburn   [] Difficulty swallowing. Genitourinary:  [x] Chronic kidney disease   [] Difficult urination  [] Frequent urination   [] Blood in urine Skin:  [] Rashes   [] Ulcers  Psychological:  [] History of anxiety   []  History of major depression.  Physical Examination  There were no vitals filed for this visit. There is no height or weight on file to calculate BMI. Gen: WD/WN, NAD Head: [place]/AT, No temporalis wasting.  Ear/Nose/Throat: Hearing grossly intact, nares w/o erythema or drainage Eyes:  PER, EOMI, sclera nonicteric.  Neck: Supple, no masses.  No bruit or JVD.  Pulmonary:  Good air movement, no audible wheezing, no use of accessory muscles.  Cardiac: RRR, normal S1, S2, no Murmurs. Vascular:  carotid bruit noted Vessel Right Left  Radial Palpable Palpable  Carotid  Palpable  Palpable  Subclav  Palpable Palpable  Gastrointestinal: soft, non-distended. No guarding/no peritoneal signs.  Musculoskeletal: M/S 5/5 throughout.  No visible deformity.  Neurologic: CN 2-12 intact. Pain and light touch intact in extremities.  Symmetrical.  Speech is fluent. Motor exam as listed above. Psychiatric: Judgment intact, Mood & affect appropriate for pt's clinical situation. Dermatologic: No rashes or ulcers noted.  No changes consistent with cellulitis.   CBC Lab Results  Component Value Date   WBC 7.6 03/21/2023   HGB 14.0 03/21/2023   HCT 41.9 03/21/2023   MCV 97.9 03/21/2023   PLT 278 03/21/2023    BMET    Component Value Date/Time   NA 140 03/31/2023 0728   NA 144 10/06/2016 1450   K 4.5 03/31/2023 0728   CL 103 03/31/2023 0728   CO2 30 03/31/2023 0728   GLUCOSE 104 (H) 03/31/2023 0728   GLUCOSE 109 06/22/2009 1228   BUN 24 (H) 03/31/2023 0728   BUN 17 10/06/2016 1450   CREATININE 1.43 03/31/2023 0728   CALCIUM 9.0 03/31/2023 0728   GFRNONAA >60 03/21/2023 0740   GFRAA 60 (L) 01/08/2020 0513   CrCl cannot be calculated (Patient's most recent lab result is older than the maximum 21 days allowed.).  COAG Lab Results  Component Value Date   INR 1.0 07/04/2022   INR 0.9 02/07/2020   INR 0.9 09/04/2013    Radiology No results found.   Assessment/Plan There are no diagnoses linked to this encounter.   Levora Dredge, MD  05/11/2023 1:15 PM

## 2023-05-13 ENCOUNTER — Other Ambulatory Visit: Payer: Self-pay | Admitting: Family Medicine

## 2023-05-15 ENCOUNTER — Encounter (INDEPENDENT_AMBULATORY_CARE_PROVIDER_SITE_OTHER): Payer: Self-pay | Admitting: Vascular Surgery

## 2023-05-15 ENCOUNTER — Ambulatory Visit (INDEPENDENT_AMBULATORY_CARE_PROVIDER_SITE_OTHER): Payer: Medicare Other | Admitting: Vascular Surgery

## 2023-05-15 VITALS — BP 113/74 | HR 75 | Resp 16 | Ht 72.0 in | Wt 134.6 lb

## 2023-05-15 DIAGNOSIS — R251 Tremor, unspecified: Secondary | ICD-10-CM | POA: Diagnosis not present

## 2023-05-15 DIAGNOSIS — I6521 Occlusion and stenosis of right carotid artery: Secondary | ICD-10-CM | POA: Diagnosis not present

## 2023-05-15 DIAGNOSIS — E782 Mixed hyperlipidemia: Secondary | ICD-10-CM

## 2023-05-15 DIAGNOSIS — N1831 Chronic kidney disease, stage 3a: Secondary | ICD-10-CM | POA: Diagnosis not present

## 2023-05-15 DIAGNOSIS — R519 Headache, unspecified: Secondary | ICD-10-CM | POA: Diagnosis not present

## 2023-05-15 DIAGNOSIS — K219 Gastro-esophageal reflux disease without esophagitis: Secondary | ICD-10-CM

## 2023-05-15 DIAGNOSIS — Z8679 Personal history of other diseases of the circulatory system: Secondary | ICD-10-CM | POA: Diagnosis not present

## 2023-05-15 DIAGNOSIS — I25118 Atherosclerotic heart disease of native coronary artery with other forms of angina pectoris: Secondary | ICD-10-CM | POA: Diagnosis not present

## 2023-05-15 DIAGNOSIS — G43719 Chronic migraine without aura, intractable, without status migrainosus: Secondary | ICD-10-CM | POA: Diagnosis not present

## 2023-05-16 ENCOUNTER — Encounter (INDEPENDENT_AMBULATORY_CARE_PROVIDER_SITE_OTHER): Payer: Self-pay | Admitting: Vascular Surgery

## 2023-05-19 DIAGNOSIS — I951 Orthostatic hypotension: Secondary | ICD-10-CM | POA: Diagnosis not present

## 2023-05-19 DIAGNOSIS — I493 Ventricular premature depolarization: Secondary | ICD-10-CM | POA: Diagnosis not present

## 2023-05-19 DIAGNOSIS — E782 Mixed hyperlipidemia: Secondary | ICD-10-CM | POA: Diagnosis not present

## 2023-05-19 DIAGNOSIS — I25118 Atherosclerotic heart disease of native coronary artery with other forms of angina pectoris: Secondary | ICD-10-CM | POA: Diagnosis not present

## 2023-05-20 LAB — BASIC METABOLIC PANEL
BUN/Creatinine Ratio: 12 (ref 10–24)
BUN: 16 mg/dL (ref 8–27)
CO2: 24 mmol/L (ref 20–29)
Calcium: 9.1 mg/dL (ref 8.6–10.2)
Chloride: 105 mmol/L (ref 96–106)
Creatinine, Ser: 1.3 mg/dL — ABNORMAL HIGH (ref 0.76–1.27)
Glucose: 123 mg/dL — ABNORMAL HIGH (ref 70–99)
Potassium: 4.8 mmol/L (ref 3.5–5.2)
Sodium: 143 mmol/L (ref 134–144)
eGFR: 58 mL/min/{1.73_m2} — ABNORMAL LOW (ref >59)

## 2023-05-25 ENCOUNTER — Other Ambulatory Visit: Payer: Self-pay | Admitting: Family Medicine

## 2023-05-25 DIAGNOSIS — D3142 Benign neoplasm of left ciliary body: Secondary | ICD-10-CM | POA: Diagnosis not present

## 2023-05-25 DIAGNOSIS — H40013 Open angle with borderline findings, low risk, bilateral: Secondary | ICD-10-CM | POA: Diagnosis not present

## 2023-05-25 DIAGNOSIS — Z961 Presence of intraocular lens: Secondary | ICD-10-CM | POA: Diagnosis not present

## 2023-05-25 DIAGNOSIS — H5203 Hypermetropia, bilateral: Secondary | ICD-10-CM | POA: Diagnosis not present

## 2023-05-25 DIAGNOSIS — H524 Presbyopia: Secondary | ICD-10-CM | POA: Diagnosis not present

## 2023-05-25 DIAGNOSIS — H43812 Vitreous degeneration, left eye: Secondary | ICD-10-CM | POA: Diagnosis not present

## 2023-05-25 DIAGNOSIS — H52223 Regular astigmatism, bilateral: Secondary | ICD-10-CM | POA: Diagnosis not present

## 2023-05-25 DIAGNOSIS — H47013 Ischemic optic neuropathy, bilateral: Secondary | ICD-10-CM | POA: Diagnosis not present

## 2023-05-25 NOTE — Telephone Encounter (Signed)
Last office visit 04/13/23 for CPE. Last refilled 04/26/23 for #30 with no refills.  Next Appt: CPE 04/16/24.

## 2023-05-29 ENCOUNTER — Telehealth: Payer: Self-pay | Admitting: Family Medicine

## 2023-05-29 NOTE — Telephone Encounter (Signed)
 Copied from CRM [id number]. Topic: Appointments - Scheduling Inquiry for Clinic >> May 29, 2023  4:11 PM Shawn Lam wrote: Reason for CRM: Patient is calling to Schedule an Osteoporosis Prolia shot. Patient is requesting a callback.

## 2023-05-30 ENCOUNTER — Other Ambulatory Visit: Payer: Self-pay

## 2023-05-30 DIAGNOSIS — M810 Age-related osteoporosis without current pathological fracture: Secondary | ICD-10-CM

## 2023-05-30 MED ORDER — DENOSUMAB 60 MG/ML ~~LOC~~ SOSY: 60.0000 mg | PREFILLED_SYRINGE | Freq: Once | SUBCUTANEOUS | Status: DC

## 2023-05-30 NOTE — Telephone Encounter (Signed)
 Patient was due in December for Prolia. I have stared order for benefits to be ran. Sending my chart to patient to let know we will reach out as soon as we have information.

## 2023-06-01 ENCOUNTER — Other Ambulatory Visit: Payer: Self-pay | Admitting: Family Medicine

## 2023-06-05 ENCOUNTER — Other Ambulatory Visit: Payer: Self-pay | Admitting: Cardiovascular Disease

## 2023-06-05 ENCOUNTER — Ambulatory Visit: Payer: Medicare Other | Admitting: Cardiology

## 2023-06-05 ENCOUNTER — Telehealth: Payer: Self-pay

## 2023-06-05 NOTE — Telephone Encounter (Signed)
 Prolia VOB initiated via [email or web address]

## 2023-06-08 ENCOUNTER — Encounter: Payer: Self-pay | Admitting: Family Medicine

## 2023-06-13 ENCOUNTER — Encounter: Payer: Self-pay | Admitting: Cardiovascular Disease

## 2023-06-13 DIAGNOSIS — Z79899 Other long term (current) drug therapy: Secondary | ICD-10-CM

## 2023-06-13 MED ORDER — LOSARTAN POTASSIUM 50 MG PO TABS: 50.0000 mg | ORAL_TABLET | Freq: Every day | ORAL | 1 refills | Status: DC

## 2023-06-13 NOTE — Telephone Encounter (Signed)
 With blood pressure still being elevated. Increase Losartan to 50 mg daily. Recheck BMET prior to his return appointment. Have him continue to monitor blood pressures at home 1-2 hours after he has taken the medication.

## 2023-06-14 ENCOUNTER — Other Ambulatory Visit: Payer: Self-pay | Admitting: Family Medicine

## 2023-06-14 MED ORDER — GABAPENTIN 100 MG PO CAPS: 100.0000 mg | ORAL_CAPSULE | Freq: Four times a day (QID) | ORAL | 1 refills | Status: DC

## 2023-06-19 DIAGNOSIS — R251 Tremor, unspecified: Secondary | ICD-10-CM | POA: Diagnosis not present

## 2023-06-19 DIAGNOSIS — R519 Headache, unspecified: Secondary | ICD-10-CM | POA: Diagnosis not present

## 2023-06-19 DIAGNOSIS — G43719 Chronic migraine without aura, intractable, without status migrainosus: Secondary | ICD-10-CM | POA: Diagnosis not present

## 2023-06-19 DIAGNOSIS — E538 Deficiency of other specified B group vitamins: Secondary | ICD-10-CM | POA: Diagnosis not present

## 2023-06-19 DIAGNOSIS — M5481 Occipital neuralgia: Secondary | ICD-10-CM | POA: Diagnosis not present

## 2023-06-19 NOTE — Telephone Encounter (Signed)
 Pt ready for scheduling for PROLIA on or after : 06/19/23  Option# 1: Buy/Bill (Office supplied medication)  Out-of-pocket cost due at time of clinic visit: $0  Number of injection/visits approved: ---  Primary: MEDICARE Prolia co-insurance: 0% Admin fee co-insurance: 0%  Secondary: AETNA-MEDSUP Prolia co-insurance:  Admin fee co-insurance:   Medical Benefit Details: Date Benefits were checked: 06/15/23 Deductible: $257 Met of $257 Required/ Coinsurance: 0%/ Admin Fee: 0%  Prior Auth: N/A PA# Expiration Date:   # of doses approved: ----------------------------------------------------------------------- Option# 2- Med Obtained from pharmacy:  Pharmacy benefit: Copay $--- (Paid to pharmacy) Admin Fee: --- (Pay at clinic)  Prior Auth: N/A PA# Expiration Date:   # of doses approved:   If patient wants fill through the pharmacy benefit please send prescription to:  --- , and include estimated need by date in rx notes. Pharmacy will ship medication directly to the office.  Patient NOT eligible for Prolia Copay Card. Copay Card can make patient's cost as little as $25. Link to apply: [email or web address]://[email or web address]  ** This summary of benefits is an estimation of the patient's out-of-pocket cost. Exact cost may very based on individual plan coverage.

## 2023-06-19 NOTE — Telephone Encounter (Signed)
 [name]

## 2023-06-21 ENCOUNTER — Ambulatory Visit: Payer: Medicare Other | Admitting: Dermatology

## 2023-06-21 ENCOUNTER — Other Ambulatory Visit: Payer: Self-pay

## 2023-06-21 DIAGNOSIS — M810 Age-related osteoporosis without current pathological fracture: Secondary | ICD-10-CM

## 2023-06-21 NOTE — Telephone Encounter (Signed)
See referral for further information.

## 2023-06-22 ENCOUNTER — Other Ambulatory Visit (INDEPENDENT_AMBULATORY_CARE_PROVIDER_SITE_OTHER)

## 2023-06-22 DIAGNOSIS — M810 Age-related osteoporosis without current pathological fracture: Secondary | ICD-10-CM

## 2023-06-22 LAB — BASIC METABOLIC PANEL
BUN: 15 mg/dL (ref 6–23)
CO2: 28 meq/L (ref 19–32)
Calcium: 9.3 mg/dL (ref 8.4–10.5)
Chloride: 105 meq/L (ref 96–112)
Creatinine, Ser: 1.28 mg/dL (ref 0.40–1.50)
GFR: 55.72 mL/min — ABNORMAL LOW (ref >60.00)
Glucose, Bld: 135 mg/dL — ABNORMAL HIGH (ref 70–99)
Potassium: 4.2 meq/L (ref 3.5–5.1)
Sodium: 142 meq/L (ref 135–145)

## 2023-06-23 ENCOUNTER — Other Ambulatory Visit: Payer: Self-pay | Admitting: Family Medicine

## 2023-06-23 NOTE — Telephone Encounter (Signed)
 Last office visit 04/13/2023 for CPE.  Last refilled 05/25/2023 for #30 with no refills.  Next Appt: CPE 04/16/24.

## 2023-06-26 ENCOUNTER — Telehealth: Payer: Self-pay | Admitting: Family Medicine

## 2023-06-26 NOTE — Telephone Encounter (Signed)
 Pt is scheduled for Prolia tomorrow. After checking his labs, his creatinine clearance is 44.19.   Dr. Ermalene Searing - please advise if pt can still get Prolia tomorrow. Thank you!

## 2023-06-27 ENCOUNTER — Ambulatory Visit

## 2023-06-27 DIAGNOSIS — Z79899 Other long term (current) drug therapy: Secondary | ICD-10-CM | POA: Diagnosis not present

## 2023-06-27 DIAGNOSIS — M810 Age-related osteoporosis without current pathological fracture: Secondary | ICD-10-CM

## 2023-06-27 MED ORDER — DENOSUMAB 60 MG/ML ~~LOC~~ SOSY
60.0000 mg | PREFILLED_SYRINGE | Freq: Once | SUBCUTANEOUS | Status: AC
  Administered 2023-06-27: 60 mg via SUBCUTANEOUS

## 2023-06-27 MED ORDER — DENOSUMAB 60 MG/ML ~~LOC~~ SOSY
60.0000 mg | PREFILLED_SYRINGE | Freq: Once | SUBCUTANEOUS | Status: AC
  Administered 2024-01-02: 60 mg via SUBCUTANEOUS

## 2023-06-27 NOTE — Progress Notes (Signed)
 Per orders of Dr. Kerby Nora, injection of prolia 60 mg Van given by Lewanda Rife in right arm.. Patient tolerated injection well. Patient will make appointment for 6 month.

## 2023-06-28 ENCOUNTER — Encounter: Payer: Self-pay | Admitting: Family Medicine

## 2023-06-28 LAB — BASIC METABOLIC PANEL
BUN/Creatinine Ratio: 12 (ref 10–24)
BUN: 13 mg/dL (ref 8–27)
CO2: 22 mmol/L (ref 20–29)
Calcium: 8.7 mg/dL (ref 8.6–10.2)
Chloride: 110 mmol/L — ABNORMAL HIGH (ref 96–106)
Creatinine, Ser: 1.1 mg/dL (ref 0.76–1.27)
Glucose: 145 mg/dL — ABNORMAL HIGH (ref 70–99)
Potassium: 4.5 mmol/L (ref 3.5–5.2)
Sodium: 145 mmol/L — ABNORMAL HIGH (ref 134–144)
eGFR: 71 mL/min/{1.73_m2} (ref >59)

## 2023-06-28 NOTE — Progress Notes (Signed)
 Kidney function continues to improve. Continue current medication regimen without changes needed at this time.

## 2023-06-30 ENCOUNTER — Encounter: Payer: Self-pay | Admitting: Family Medicine

## 2023-06-30 ENCOUNTER — Ambulatory Visit (INDEPENDENT_AMBULATORY_CARE_PROVIDER_SITE_OTHER): Admitting: Family Medicine

## 2023-06-30 VITALS — BP 100/68 | HR 81 | Temp 98.2°F | Ht 69.0 in | Wt 132.5 lb

## 2023-06-30 DIAGNOSIS — F331 Major depressive disorder, recurrent, moderate: Secondary | ICD-10-CM

## 2023-06-30 DIAGNOSIS — R739 Hyperglycemia, unspecified: Secondary | ICD-10-CM | POA: Diagnosis not present

## 2023-06-30 DIAGNOSIS — F411 Generalized anxiety disorder: Secondary | ICD-10-CM

## 2023-06-30 LAB — POCT GLYCOSYLATED HEMOGLOBIN (HGB A1C): Hemoglobin A1C: 5.5 % (ref 4.0–5.6)

## 2023-06-30 MED ORDER — BUPROPION HCL ER (XL) 150 MG PO TB24: 450.0000 mg | ORAL_TABLET | Freq: Every day | ORAL | 1 refills | Status: DC

## 2023-06-30 NOTE — Progress Notes (Signed)
 Patient ID: Shawn Lam, male    DOB: 1950-06-25, 73 y.o.   MRN: [id number]  This visit was conducted in person.  BP 100/68 (BP Location: Left Arm, Patient Position: Sitting, Cuff Size: Normal)   Pulse 81   Temp 98.2 F (36.8 C) (Temporal)   Ht 5\' 9"  (1.753 m)   Wt 132 lb 8 oz (60.1 kg)   SpO2 99%   BMI 19.57 kg/m    CC:  Chief Complaint  Patient presents with   Hyperglycemia    Subjective:   HPI: Shawn Lam is a 73 y.o. male presenting on 06/30/2023 for Hyperglycemia   He has noted  fasting blood sugars trending up on recent labs.  02/2023 92 12//2024 104-109 05/19/2023: 123 06/22/2023  135 06/27/2023 145 Lab Results  Component Value Date   HGBA1C 5.5 06/30/2023    He has a strong family history of diabetes.   No steroid injections.  Feeling more tired lately.  Walking /jog daily 3 miles.    GAD  inadequate control...  On Cymbalta 90 mg daily  Wellbutrin Xl  300 mg daily  Buspar 20 TID max.       04/13/2023    9:40 AM 03/08/2022    4:39 PM 12/28/2021    4:28 PM 07/30/2021   11:37 AM  GAD 7 : Generalized Anxiety Score  Nervous, Anxious, on Edge 3 3 3  0  Control/stop worrying 3 3 3  0  Worry too much - different things 3 3 3  0  Trouble relaxing 3 2 2  0  Restless 3 2 2  0  Easily annoyed or irritable 3 3 3  0  Afraid - awful might happen 0 1 0 0  Total GAD 7 Score 18 17 16  0  Anxiety Difficulty Somewhat difficult Somewhat difficult Somewhat difficult Not difficult at all        04/13/2023    9:39 AM 03/27/2023    8:25 AM 03/29/2022    8:52 AM  Depression screen PHQ 2/9  Decreased Interest 2 0 0  Down, Depressed, Hopeless 3 0 0  PHQ - 2 Score 5 0 0  Altered sleeping 1  0  Tired, decreased energy 3  0  Change in appetite 0  0  Feeling bad or failure about yourself  1  0  Trouble concentrating 1  0  Moving slowly or fidgety/restless 0  0  Suicidal thoughts 0  0  PHQ-9 Score 11  0  Difficult doing work/chores Somewhat difficult     Also on   Gabapentin 100 mg QID  Relevant past medical, surgical, family and social history reviewed and updated as indicated. Interim medical history since our last visit reviewed. Allergies and medications reviewed and updated. Outpatient Medications Prior to Visit  Medication Sig Dispense Refill   aspirin EC 81 MG tablet Take 81 mg by mouth daily.     atorvastatin (LIPITOR) 80 MG tablet TAKE 1 TABLET BY MOUTH DAILY 90 tablet 0   busPIRone (BUSPAR) 10 MG tablet Take 2 tablets (20 mg total) by mouth 3 (three) times daily. 180 tablet 5   Cholecalciferol (VITAMIN D3) 50 MCG (2000 UT) TABS Take 2,000 Units by mouth daily.     clopidogrel (PLAVIX) 75 MG tablet TAKE 1 TABLET BY MOUTH DAILY 90 tablet 3   cyanocobalamin (VITAMIN B12) 1000 MCG tablet Take 1,000 mcg by mouth daily.     denosumab (PROLIA) 60 MG/ML SOSY injection Inject 60 mg into the skin every 6 (six)  months. Do not send RX, order this from the office     DULoxetine (CYMBALTA) 30 MG capsule TAKE THREE CAPSULES BY MOUTH DAILY 90 capsule 5   Erenumab-aooe (AIMOVIG) 140 MG/ML SOAJ 140 mg every 28 (twenty-eight) days.     ferrous sulfate 325 (65 FE) MG tablet Take 325 mg by mouth daily with breakfast.     finasteride (PROSCAR) 5 MG tablet Take 5 mg by mouth daily.     gabapentin (NEURONTIN) 100 MG capsule Take 1 capsule (100 mg total) by mouth 4 (four) times daily. 120 capsule 1   HYDROcodone-acetaminophen (NORCO/VICODIN) 5-325 MG tablet Take 2 tablets by mouth every 4 (four) hours as needed for severe pain (pain score 7-10). 30 tablet 0   levothyroxine (SYNTHROID) 88 MCG tablet TAKE 1 TABLET BY MOUTH DAILY BEFORE BREAKFAST 90 tablet 0   losartan (COZAAR) 50 MG tablet Take 1 tablet (50 mg total) by mouth daily. 90 tablet 1   Magnesium 250 MG CAPS Take 250 mg by mouth daily.     Multiple Vitamins-Minerals (MULTIVITAMIN WITH MINERALS) tablet Take 1 tablet by mouth daily.     nitroGLYCERIN (NITROSTAT) 0.4 MG SL tablet Place 1 tablet (0.4 mg total)  under the tongue every 5 (five) minutes as needed for chest pain. 10 tablet 1   primidone (MYSOLINE) 50 MG tablet Take 50 mg by mouth 2 (two) times daily.     sucralfate (CARAFATE) 1 g tablet TAKE 1 TABLET BY MOUTH 4 TIMES A DAY WITH MEALS AND AT BEDTIME 120 tablet 2   triamcinolone cream (KENALOG) 0.1 % APPLY TOPICALLY TWO TIMES A DAY AS NEEDED .DO NOT APPLY TO FACE, GROIN, UNDERARMS 454 g 0   zolpidem (AMBIEN) 10 MG tablet TAKE 1 TABLET BY MOUTH EVERY NIGHT AT BEDTIME AS NEEDED 30 tablet 0   buPROPion (WELLBUTRIN XL) 150 MG 24 hr tablet TAKE 2 TABLETS BY MOUTH DAILY 60 tablet 2   Elastic Bandages & Supports (ABDOMINAL BINDER/ELASTIC LARGE) MISC 1 Units by Does not apply route daily. 1 each 1   methocarbamol (ROBAXIN-750) 750 MG tablet Take 1 tablet (750 mg total) by mouth 4 (four) times daily. 45 tablet 0   midodrine (PROAMATINE) 5 MG tablet Take 1 tablet (5 mg total) by mouth 3 (three) times daily. (Patient not taking: Reported on 04/13/2023)     ondansetron (ZOFRAN-ODT) 4 MG disintegrating tablet Take 1 tablet (4 mg total) by mouth every 6 (six) hours as needed for nausea or vomiting. (Patient not taking: Reported on 04/13/2023) 20 tablet 0   Facility-Administered Medications Prior to Visit  Medication Dose Route Frequency Provider Last Rate Last Admin   denosumab (PROLIA) injection 60 mg  60 mg Subcutaneous Once Darcia Lampi E, MD       [START ON 12/28/2023] denosumab (PROLIA) injection 60 mg  60 mg Subcutaneous Once Deneane Stifter E, MD         Per HPI unless specifically indicated in ROS section below Review of Systems  Constitutional:  Negative for fatigue and fever.  HENT:  Negative for ear pain.   Eyes:  Negative for pain.  Respiratory:  Negative for cough and shortness of breath.   Cardiovascular:  Negative for chest pain, palpitations and leg swelling.  Gastrointestinal:  Negative for abdominal pain.  Genitourinary:  Negative for dysuria.  Musculoskeletal:  Negative for arthralgias.   Neurological:  Negative for syncope, light-headedness and headaches.  Psychiatric/Behavioral:  Negative for dysphoric mood.    Objective:  BP 100/68 (BP Location:  Left Arm, Patient Position: Sitting, Cuff Size: Normal)   Pulse 81   Temp 98.2 F (36.8 C) (Temporal)   Ht 5\' 9"  (1.753 m)   Wt 132 lb 8 oz (60.1 kg)   SpO2 99%   BMI 19.57 kg/m   Wt Readings from Last 3 Encounters:  06/30/23 132 lb 8 oz (60.1 kg)  05/15/23 134 lb 9.6 oz (61.1 kg)  05/09/23 135 lb 6.4 oz (61.4 kg)      Physical Exam Constitutional:      Appearance: He is well-developed.  HENT:     Head: Normocephalic.     Right Ear: Hearing normal.     Left Ear: Hearing normal.     Nose: Nose normal.  Neck:     Thyroid: No thyroid mass or thyromegaly.     Vascular: No carotid bruit.     Trachea: Trachea normal.  Cardiovascular:     Rate and Rhythm: Normal rate and regular rhythm.     Pulses: Normal pulses.     Heart sounds: Heart sounds not distant. No murmur heard.    No friction rub. No gallop.     Comments: No peripheral edema Pulmonary:     Effort: Pulmonary effort is normal. No respiratory distress.     Breath sounds: Normal breath sounds.  Skin:    General: Skin is warm and dry.     Findings: No rash.  Psychiatric:        Speech: Speech normal.        Behavior: Behavior normal.        Thought Content: Thought content normal.       Results for orders placed or performed in visit on 06/30/23  POCT glycosylated hemoglobin (Hb A1C)   Collection Time: 06/30/23 11:52 AM  Result Value Ref Range   Hemoglobin A1C 5.5 4.0 - 5.6 %   HbA1c POC (<> result, manual entry)     HbA1c, POC (prediabetic range)     HbA1c, POC (controlled diabetic range)     *Note: Due to a large number of results and/or encounters for the requested time period, some results have not been displayed. A complete set of results can be found in Results Review.    Assessment and Plan  Elevated blood sugar Assessment &  Plan: Acute worsening, will continue to follow given but today in office A1c is in the normal range. Encouraged patient to work on low carbohydrate diet but to keep up with lean protein and nutrition.  Orders: -     POCT glycosylated hemoglobin (Hb A1C)  Generalized anxiety disorder Assessment & Plan: Chronic, inadequate control Patient reports at this time more significant depression than anxiety.   MDD (major depressive disorder), recurrent episode, moderate (HCC) Assessment & Plan: Chronic, acute worsening in the last several months.  He is currently taking Cymbalta 90 mg daily, Wellbutrin XL 300 mg daily and BuSpar 20 mg 3 times daily.  Given his primary symptoms are depression related we will try a trial of increase Wellbutrin XL to 450 mg daily while continuing the other medications.  Of note in the past he had tried tried multiple medications with unclear benefit, no definite side effects including venlafaxine, fluoxetine, sertraline, Lexapro and alprazolam   Other orders -     buPROPion HCl ER (XL); Take 3 tablets (450 mg total) by mouth daily.  Dispense: 90 tablet; Refill: 1    No follow-ups on file.   Kerby Nora, MD

## 2023-06-30 NOTE — Assessment & Plan Note (Addendum)
 Chronic, inadequate control Patient reports at this time more significant depression than anxiety.  BuSpar 20 mg 3 times daily Also on  Gabapentin 100 mg QID

## 2023-06-30 NOTE — Assessment & Plan Note (Signed)
 Acute worsening, will continue to follow given but today in office A1c is in the normal range. Encouraged patient to work on low carbohydrate diet but to keep up with lean protein and nutrition.

## 2023-06-30 NOTE — Assessment & Plan Note (Signed)
 Chronic, acute worsening in the last several months.  He is currently taking Cymbalta 90 mg daily, Wellbutrin XL 300 mg daily and BuSpar 20 mg 3 times daily.  Given his primary symptoms are depression related we will try a trial of increase Wellbutrin XL to 450 mg daily while continuing the other medications.  Of note in the past he had tried tried multiple medications with unclear benefit, no definite side effects including venlafaxine, fluoxetine, sertraline, Lexapro and alprazolam

## 2023-07-03 ENCOUNTER — Telehealth: Payer: Self-pay | Admitting: *Deleted

## 2023-07-03 MED ORDER — BUPROPION HCL ER (XL) 150 MG PO TB24: 450.0000 mg | ORAL_TABLET | Freq: Every day | ORAL | 1 refills | Status: DC

## 2023-07-03 NOTE — Telephone Encounter (Signed)
 Copied from CRM [id number]. Topic: Clinical - Medication Question >> Jul 03, 2023 12:36 PM Deaijah H wrote: Reason for CRM: Patient would like to know if buPROPion (WELLBUTRIN XL) 150 MG 24 hr tablet was the medication that Dr. Ermalene Searing had up the dosage on and if it was sent in. Advised it was sent on 3/21 stated pharmacy claims to not have it. Please call [phone]  to confirm

## 2023-07-03 NOTE — Telephone Encounter (Signed)
 Bupropion prescription with new dose re-sent to [facility].  [name] notified of this via telephone.  I made sure he knew the Rx for for 150 mg tablets to take 3 tablets daily to equal 450 mg.  Patient states understanding.

## 2023-07-04 ENCOUNTER — Ambulatory Visit: Payer: Medicare Other | Attending: Cardiology | Admitting: Cardiology

## 2023-07-04 ENCOUNTER — Encounter: Payer: Self-pay | Admitting: Cardiology

## 2023-07-04 VITALS — BP 130/82 | HR 87 | Ht 72.0 in | Wt 133.0 lb

## 2023-07-04 DIAGNOSIS — Z8679 Personal history of other diseases of the circulatory system: Secondary | ICD-10-CM | POA: Diagnosis not present

## 2023-07-04 DIAGNOSIS — E782 Mixed hyperlipidemia: Secondary | ICD-10-CM | POA: Diagnosis not present

## 2023-07-04 DIAGNOSIS — G4733 Obstructive sleep apnea (adult) (pediatric): Secondary | ICD-10-CM

## 2023-07-04 DIAGNOSIS — N183 Chronic kidney disease, stage 3 unspecified: Secondary | ICD-10-CM

## 2023-07-04 DIAGNOSIS — I10 Essential (primary) hypertension: Secondary | ICD-10-CM

## 2023-07-04 DIAGNOSIS — I25118 Atherosclerotic heart disease of native coronary artery with other forms of angina pectoris: Secondary | ICD-10-CM | POA: Diagnosis not present

## 2023-07-04 MED ORDER — NITROGLYCERIN 0.4 MG SL SUBL: 0.4000 mg | SUBLINGUAL_TABLET | SUBLINGUAL | 1 refills | Status: DC | PRN

## 2023-07-04 NOTE — Patient Instructions (Signed)
 Medication Instructions:  Your physician recommends the following medication changes.  Continue :  Losartan 50 MG on the A.M and 50 MG at bedtime with a Systolic Blood pressure of 150 or greater *If you need a refill on your cardiac medications before your next appointment, please call your pharmacy*   Lab Work: No labs ordered today  If you have labs (blood work) drawn today and your tests are completely normal, you will receive your results only by: MyChart Message (if you have MyChart) OR A paper copy in the mail If you have any lab test that is abnormal or we need to change your treatment, we will call you to review the results.   Testing/Procedures: No test ordered today    Follow-Up: At [facility], you and your health needs are our priority.  As part of our continuing mission to provide you with exceptional heart care, we have created designated Provider Care Teams.  These Care Teams include your primary Cardiologist (physician) and Advanced Practice Providers (APPs -  Physician Assistants and Nurse Practitioners) who all work together to provide you with the care you need, when you need it.  Your next appointment:   1 month(s)  Provider:   You may see Julien Nordmann, MD or one of the following Advanced Practice Providers on your designated Care Team:   Nicolasa Ducking, NP Eula Listen, PA-C Cadence Fransico Michael, PA-C Charlsie Quest, NP Carlos Levering, NP

## 2023-07-04 NOTE — Progress Notes (Signed)
 Cardiology Office Note:  .   Date:  07/04/2023  ID:  Shawn Lam, DOB 1950-08-16, MRN [id number] PCP: Excell Seltzer, MD  [facility] Providers Cardiologist:  Julien Nordmann, MD    History of Present Illness: .   Shawn Lam is a 73 y.o. male with past medical history of coronary disease status post PCI to the RCA, recurrent syncope secondary orthostatic hypotension, frequent PVCs, hyperlipidemia, mildly dilated aortic root, CKD stage III, iron deficiency anemia, hypothyroidism, OSA s/p inspire device, and lymphedema who presents today for follow-up of his coronary artery disease.   Cardiac MRI 10/2013 demonstrated normal LVEF and RVEF with no evidence of LGE.  He underwent loop recorder implantation in 05/2016 with device interrogation showed no evidence of significant arrhythmia, prolonged pauses, or high-grade AV block.  Lexiscan MPI completed in 11/2018 showed no evidence of ischemia and was overall low risk with an EF of 55 to 65%.  Echocardiogram from 11/2018 demonstrated an EF of 60-65%, diastolic dysfunction, no RWMA, normal RV systolic function, and aortic valve to be normal, no significant valvular abnormalities.  He was seen in the office 04/2021 and noted a syncopal episode around Christmas in 2022 associated with standing.  He also reported palpitations while lying down at night.  He remained on midodrine 10 mg 3 times daily.  He was evaluated again in April 2023 for recurrent syncope and preoperative cardiac risk stratification for TURP.  Symptoms of syncope continued in the context of positional dizziness.  He was without symptoms of angina or decompensation.  He underwent repeat event monitoring which demonstrated predominant rhythm was sinus with an average heart rate of 60 bpm, 11 episodes of SVT frequent PVCs with a burden of 8.4, given PVC burden in the context of significant orthostasis he was referred to EP.  Repeat echocardiogram demonstrated EF of 50%, global  hypokinesis, normal RV systolic function, and estimated right atrial pressure of 15 mmHg.  EP evaluation was completed 08/28/2021 with a conservative approach recommended given the burden of less than 10% PVCs.  Subsequent Lexiscan MPI in 09/2021 showed no evidence of ischemia.  Echo 1 month prior demonstrated preserved LV systolic function, LAD and RCA calcifications were noted on CT attenuation corrected images but overall was considered a low risk study.   He was admitted 03/13/2023 for cervical spondylosis with radiculopathy further C4-5 and C5-6.  He tolerated surgery well and was taken to the recovery room in the floor in stable condition.  Hospital course was without complications.  Dressing remained clean dry and intact.  Considered stable for discharge and was discharged on 03/14/2023.   He was evaluated at [facility] emergency department on 03/21/2023 with complaint of headache.  He been experiencing for last 2 to 3 days.  Blood pressure was suboptimally controlled at 153/101 and 169/98.  He was treated with fentanyl, Zofran, and normal saline. Imaging revealed no evidence of injury.He was sent home with a Zofran prescription.    He was last seen in clinic 05/09/2023 stating that he felt well.  He had recently been started on a medication for migraines from neurology and stated it helped his headaches.  He had also followed up with nephrology since being off of monitor with his blood pressure going up and Shawn Lam as, when starting losartan but he wanted to follow-up in clinic before starting any new medications.  He was encouraged to quit and started losartan 12.5 mg daily with recommendation to monitor his blood pressures 1 to 2  hours postmedication administration and he was scheduled for BMP 2 weeks after starting the medication to reassess kidney function.  He returns to clinic today stating he has been doing well from the cardiac perspective.  He denies any chest pain, shortness of breath, dyspnea on  exertion, or palpitations.  He noted he has had occasional bouts of dizziness and lightheadedness and has had 2 falls.  No injury and no syncope or near syncope.  He has been keeping a blood pressure log of his blood pressures at home and he is running anywhere from 130 on upwards of 200.  And then will drop some higher blood pressure to a lower blood pressure which likely causes the symptoms with position movement.  His legs was previously increased due to blood pressures recently.  He states that he has been compliant with his current medication regimen and has no adverse effects.  Is questioning today if his blood pressure medication needs to be up to prevent elevated blood pressure disease can get home.  Denies any recent hospitalizations or visits to the emergency department.  ROS: 10 point review of system has been reviewed and considered negative with exception was been listed in HPI  Studies Reviewed: [name]        Lexiscan MPI 09/22/2021:   The study is low risk.   There is no evidence of ischemia   Left ventricular function visually appears normal. Calculated EF is 39%. correlation with echo advised   LAD and RCA calcifications noted   Image quality degraded by diaphragmatic artifacts.   2D echo 08/19/2021: 1. Left ventricular ejection fraction, by estimation, is 50% . The left  ventricle has low normal function. The left ventricle demonstrates global  hypokinesis.   2. Right ventricular systolic function is normal. The right ventricular  size is normal. There is normal pulmonary artery systolic pressure. The  estimated right ventricular systolic pressure is 35.2 mmHg.   3. The mitral valve is normal in structure. Mild mitral valve  regurgitation. No evidence of mitral stenosis.   4. The aortic valve was not well visualized. Aortic valve regurgitation  is not visualized. No aortic stenosis is present.   5. The inferior vena cava is dilated in size with <50% respiratory  variability,  suggesting right atrial pressure of 15 mmHg.   6. PVCs noted   Comparison(s): EF 60-65%. Risk Assessment/Calculations:             Physical Exam:   VS:  BP 130/82 (BP Location: Right Arm, Patient Position: Sitting, Cuff Size: Normal)   Pulse 87   Ht 6' (1.829 m)   Wt 133 lb (60.3 kg)   SpO2 100%   BMI 18.04 kg/m    Wt Readings from Last 3 Encounters:  07/04/23 133 lb (60.3 kg)  06/30/23 132 lb 8 oz (60.1 kg)  05/15/23 134 lb 9.6 oz (61.1 kg)    GEN: Well nourished, well developed in no acute distress NECK: No JVD; No carotid bruits CARDIAC: RRR, no murmurs, rubs, gallops RESPIRATORY:  Clear to auscultation without rales, wheezing or rhonchi  ABDOMEN: Soft, non-tender, non-distended EXTREMITIES:  No edema; No deformity   ASSESSMENT AND PLAN: .   Coronary artery disease with prior PCI to the LAD and RCA.  Patient continues to deny any symptoms of angina or anginal equivalents.  Continue aspirin 81 mg daily clopidogrel 75 mg daily as well as atorvastatin 80 mg daily.  No further ischemic testing needed at this time.  History of orthostatic  hypotension with hypertension blood pressure when patient first placed in room was 100/70 he was concerned about blood pressure readings SBP running 160s to 200 on his home monitor recheck of his blood pressure revealed 140/82 in the left arm 130/82 in the right arm.  He is currently continued on losartan 50 mg daily and we have advised him to take his blood pressure 1 to 2 hours postmedication administration if his blood pressure is greater than 150 at bedtime, and then he will take an additional 50 mg as he has tremendous spike in his blood pressure in the evening in the morning when he first wakes.  He has been advised that he will require a repeat BMP prior to return.  Mixed hyperlipidemia where his LDLs continue to remain at goal.  He is continued on atorvastatin 80 mg daily.  CKD stage III 1/7 creatinine of 1.36.  Kidney function has remained  stable.  He continues to be followed by nephrology.  Obstructive sleep apnea where he is intolerant to CPAP.  Inspire device implanted 2019.  No recurrence of symptoms.       Dispo: Patient return to clinic to see MD/APP in 4 weeks or sooner if needed for reevaluation of blood pressure.  Signed, Siboney Requejo, NP

## 2023-07-11 ENCOUNTER — Ambulatory Visit: Payer: Medicare Other | Admitting: Cardiology

## 2023-07-12 ENCOUNTER — Other Ambulatory Visit (INDEPENDENT_AMBULATORY_CARE_PROVIDER_SITE_OTHER): Payer: Medicare Other

## 2023-07-12 DIAGNOSIS — D509 Iron deficiency anemia, unspecified: Secondary | ICD-10-CM

## 2023-07-12 DIAGNOSIS — E039 Hypothyroidism, unspecified: Secondary | ICD-10-CM | POA: Diagnosis not present

## 2023-07-12 DIAGNOSIS — E782 Mixed hyperlipidemia: Secondary | ICD-10-CM

## 2023-07-12 LAB — COMPREHENSIVE METABOLIC PANEL WITH GFR
ALT: 54 U/L — ABNORMAL HIGH (ref 0–53)
AST: 50 U/L — ABNORMAL HIGH (ref 0–37)
Albumin: 3.9 g/dL (ref 3.5–5.2)
Alkaline Phosphatase: 67 U/L (ref 39–117)
BUN: 18 mg/dL (ref 6–23)
CO2: 29 meq/L (ref 19–32)
Calcium: 8.7 mg/dL (ref 8.4–10.5)
Chloride: 101 meq/L (ref 96–112)
Creatinine, Ser: 1.15 mg/dL (ref 0.40–1.50)
GFR: 63.34 mL/min (ref >60.00)
Glucose, Bld: 90 mg/dL (ref 70–99)
Potassium: 4.4 meq/L (ref 3.5–5.1)
Sodium: 139 meq/L (ref 135–145)
Total Bilirubin: 0.6 mg/dL (ref 0.2–1.2)
Total Protein: 5.9 g/dL — ABNORMAL LOW (ref 6.0–8.3)

## 2023-07-12 LAB — T3, FREE: T3, Free: 2.9 pg/mL (ref 2.3–4.2)

## 2023-07-12 LAB — IBC + FERRITIN
Ferritin: 431.3 ng/mL — ABNORMAL HIGH (ref 22.0–322.0)
Iron: 74 ug/dL (ref 42–165)
Saturation Ratios: 33.7 % (ref 20.0–50.0)
TIBC: 219.8 ug/dL — ABNORMAL LOW (ref 250.0–450.0)
Transferrin: 157 mg/dL — ABNORMAL LOW (ref 212.0–360.0)

## 2023-07-12 LAB — LIPID PANEL
Cholesterol: 147 mg/dL (ref 0–200)
HDL: 73.2 mg/dL (ref >39.00)
LDL Cholesterol: 57 mg/dL (ref 0–99)
NonHDL: 74.12
Total CHOL/HDL Ratio: 2
Triglycerides: 84 mg/dL (ref 0.0–149.0)
VLDL: 16.8 mg/dL (ref 0.0–40.0)

## 2023-07-12 LAB — TSH: TSH: 1.25 u[IU]/mL (ref 0.35–5.50)

## 2023-07-12 LAB — T4, FREE: Free T4: 0.73 ng/dL (ref 0.60–1.60)

## 2023-07-13 ENCOUNTER — Other Ambulatory Visit: Payer: Self-pay | Admitting: Family Medicine

## 2023-07-13 ENCOUNTER — Encounter: Payer: Self-pay | Admitting: Family Medicine

## 2023-07-13 DIAGNOSIS — R7989 Other specified abnormal findings of blood chemistry: Secondary | ICD-10-CM

## 2023-07-13 DIAGNOSIS — R7401 Elevation of levels of liver transaminase levels: Secondary | ICD-10-CM

## 2023-07-24 ENCOUNTER — Other Ambulatory Visit: Payer: Self-pay | Admitting: Family Medicine

## 2023-07-25 NOTE — Telephone Encounter (Signed)
 Last office visit 06/30/2023 for hyperglycemia. Last refilled 06/23/2023 for #30 with no refills. Next Appt: CPE 04/16/24.

## 2023-07-31 ENCOUNTER — Encounter: Payer: Self-pay | Admitting: Cardiovascular Disease

## 2023-08-02 MED ORDER — LOSARTAN POTASSIUM 50 MG PO TABS: 50.0000 mg | ORAL_TABLET | Freq: Two times a day (BID) | ORAL | 1 refills | Status: DC

## 2023-08-02 NOTE — Telephone Encounter (Signed)
Yes, please and thank you!

## 2023-08-03 ENCOUNTER — Other Ambulatory Visit: Payer: Self-pay | Admitting: Family Medicine

## 2023-08-03 ENCOUNTER — Other Ambulatory Visit: Payer: Self-pay | Admitting: Cardiology

## 2023-08-03 DIAGNOSIS — R21 Rash and other nonspecific skin eruption: Secondary | ICD-10-CM

## 2023-08-03 NOTE — Telephone Encounter (Signed)
 Last office visit 06/30/2023 for hyperglycemia.  Last refilled 03/31/2023 for 454 g with no refills.  Next Appt: CPE 04/16/24

## 2023-08-04 MED ORDER — LOSARTAN POTASSIUM 50 MG PO TABS: 50.0000 mg | ORAL_TABLET | Freq: Two times a day (BID) | ORAL | 3 refills | Status: DC

## 2023-08-04 NOTE — Addendum Note (Signed)
 Addended by: Elvia Hammans on: 08/04/2023 02:55 PM   Modules accepted: Orders

## 2023-08-09 ENCOUNTER — Other Ambulatory Visit: Payer: Self-pay | Admitting: Family Medicine

## 2023-08-09 ENCOUNTER — Ambulatory Visit: Attending: Cardiology | Admitting: Cardiology

## 2023-08-09 ENCOUNTER — Encounter: Payer: Self-pay | Admitting: Cardiology

## 2023-08-09 VITALS — BP 132/88 | HR 77 | Ht 72.0 in | Wt 131.0 lb

## 2023-08-09 DIAGNOSIS — I10 Essential (primary) hypertension: Secondary | ICD-10-CM | POA: Insufficient documentation

## 2023-08-09 DIAGNOSIS — E782 Mixed hyperlipidemia: Secondary | ICD-10-CM | POA: Insufficient documentation

## 2023-08-09 DIAGNOSIS — I25118 Atherosclerotic heart disease of native coronary artery with other forms of angina pectoris: Secondary | ICD-10-CM | POA: Diagnosis not present

## 2023-08-09 DIAGNOSIS — N183 Chronic kidney disease, stage 3 unspecified: Secondary | ICD-10-CM | POA: Insufficient documentation

## 2023-08-09 DIAGNOSIS — G4733 Obstructive sleep apnea (adult) (pediatric): Secondary | ICD-10-CM | POA: Diagnosis not present

## 2023-08-09 DIAGNOSIS — Z8679 Personal history of other diseases of the circulatory system: Secondary | ICD-10-CM | POA: Diagnosis not present

## 2023-08-09 DIAGNOSIS — I493 Ventricular premature depolarization: Secondary | ICD-10-CM | POA: Diagnosis not present

## 2023-08-09 DIAGNOSIS — I491 Atrial premature depolarization: Secondary | ICD-10-CM | POA: Diagnosis not present

## 2023-08-09 MED ORDER — METOPROLOL SUCCINATE ER 25 MG PO TB24: 25.0000 mg | ORAL_TABLET | Freq: Every day | ORAL | 3 refills | Status: DC

## 2023-08-09 NOTE — Telephone Encounter (Signed)
 Last office visit 06/30/2022 for hyperglycemia.  Last refilled 06/14/2023 for #120 with 1 refill.  Next Appt: CPE 04/16/2024

## 2023-08-09 NOTE — Progress Notes (Signed)
 Cardiology Office Note:  .   Date:  08/09/2023  ID:  Shawn Lam, DOB 01/06/1951, MRN [id number] PCP: Judithann Novas, MD  [facility] Providers Cardiologist:  Belva Boyden, MD    History of Present Illness: .   Shawn Lam is a 73 y.o. male with past medical history of coronary artery disease status post PCI to the RCA, recurrent syncope, secondary orthostatic hypotension, frequent PVCs, hyperlipidemia, mildly dilated aortic root, CKD stage III, iron deficiency anemia, hypothyroidism, OSA status post inspire device, and lymphedema, presents today for follow-up of his coronary artery disease.   Cardiac MRI 10/2013 demonstrated normal LVEF and RVEF with no evidence of LGE.  He underwent loop recorder implantation in 05/2016 with device interrogation showed no evidence of significant arrhythmia, prolonged pauses, or high-grade AV block.  Lexiscan  MPI completed in 11/2018 showed no evidence of ischemia and was overall low risk with an EF of 55 to 65%.  Echocardiogram from 11/2018 demonstrated an EF of 60-65%, diastolic dysfunction, no RWMA, normal RV systolic function, and aortic valve to be normal, no significant valvular abnormalities.  He was seen in the office 04/2021 and noted a syncopal episode around Christmas in 2022 associated with standing.  He also reported palpitations while lying down at night.  He remained on midodrine  10 mg 3 times daily.  He was evaluated again in April 2023 for recurrent syncope and preoperative cardiac risk stratification for TURP.  Symptoms of syncope continued in the context of positional dizziness.  He was without symptoms of angina or decompensation.  He underwent repeat event monitoring which demonstrated predominant rhythm was sinus with an average heart rate of 60 bpm, 11 episodes of SVT frequent PVCs with a burden of 8.4, given PVC burden in the context of significant orthostasis he was referred to EP.  Repeat echocardiogram demonstrated EF of 50%,  global hypokinesis, normal RV systolic function, and estimated right atrial pressure of 15 mmHg.  EP evaluation was completed 08/28/2021 with a conservative approach recommended given the burden of less than 10% PVCs.  Subsequent Lexiscan  MPI in 09/2021 showed no evidence of ischemia.  Echo 1 month prior demonstrated preserved LV systolic function, LAD and RCA calcifications were noted on CT attenuation corrected images but overall was considered a low risk study.   He was admitted 03/13/2023 for cervical spondylosis with radiculopathy further C4-5 and C5-6.  He tolerated surgery well and was taken to the recovery room in the floor in stable condition.  Hospital course was without complications.  Dressing remained clean dry and intact.  Considered stable for discharge and was discharged on 03/14/2023.   He was evaluated at [facility] emergency department on 03/21/2023 with complaint of headache.  He been experiencing for last 2 to 3 days.  Blood pressure was suboptimally controlled at 153/101 and 169/98.  He was treated with fentanyl , Zofran , and normal saline. Imaging revealed no evidence of injury.He was sent home with a Zofran  prescription.   He was evaluated in cardiology clinic 05/01/2023 stating that he felt well.  He had recently been started on medication for migraines from neurology that help with his headaches.  He had also followed up with nephrology since being off of blood pressure medications.  He was started on losartan  12.5 mg daily and scheduled for repeat BMP in 2 weeks after starting to reassess his kidney function.   He was last seen in clinic 07/04/2023 and overall was doing well from the cardiac perspective.  Denies any chest  pain, shortness of breath, dyspnea on exertion or palpitations.  He noted an occasional bout of dizziness and lightheadedness and had had 2 falls but denied syncope or near syncope.  Blood pressure log at home and his blood pressure running from 130 upwards of 200 systolic.   He is continued on losartan  50 mg daily with an additional 50 mg at bedtime if his blood pressure is greater than 150.  He was also scheduled for repeat BMP prior to return.  He returns to clinic today stating that he has been doing well from a cardiac perspective.  He denies any chest pain, shortness of breath, dyspnea on exertion or palpitations.  He has noted occasional elevations in his blood pressure continues to monitor his pressure at home.  Recently he was increased on losartan  to 100 mg daily as he is taking 50 mg twice daily.  He is also noted an uptick in his heart rate at this time.  He states he has been compliant with his current medication regimen without any adverse effects.  ROS: 10 point review of systems has been reviewed and considered negative except ones are listed in the HPI  Studies Reviewed: Aaron Aas   EKG Interpretation Date/Time:  Wednesday August 09 2023 13:49:31 EDT Ventricular Rate:  77 PR Interval:    QRS Duration:  74 QT Interval:  378 QTC Calculation: 427 R Axis:   57  Text Interpretation: Sinus rhythm Nonspecific T wave abnormality When compared with ECG of 07-Mar-2023 09:19, Artifact , No significant change since last tracing Confirmed by Ronald Cockayne ([id number]) on 08/09/2023 1:52:02 PM    Lexiscan  MPI 09/22/2021:   The study is low risk.   There is no evidence of ischemia   Left ventricular function visually appears normal. Calculated EF is 39%. correlation with echo advised   LAD and RCA calcifications noted   Image quality degraded by diaphragmatic artifacts.   2D echo 08/19/2021: 1. Left ventricular ejection fraction, by estimation, is 50% . The left  ventricle has low normal function. The left ventricle demonstrates global  hypokinesis.   2. Right ventricular systolic function is normal. The right ventricular  size is normal. There is normal pulmonary artery systolic pressure. The  estimated right ventricular systolic pressure is 35.2 mmHg.   3. The mitral  valve is normal in structure. Mild mitral valve  regurgitation. No evidence of mitral stenosis.   4. The aortic valve was not well visualized. Aortic valve regurgitation  is not visualized. No aortic stenosis is present.   5. The inferior vena cava is dilated in size with <50% respiratory  variability, suggesting right atrial pressure of 15 mmHg.   6. PVCs noted   Comparison(s): EF 60-65%. Risk Assessment/Calculations:             Physical Exam:   VS:  BP 132/88   Pulse 77   Ht 6' (1.829 m)   Wt 131 lb (59.4 kg)   SpO2 99%   BMI 17.77 kg/m    Wt Readings from Last 3 Encounters:  08/09/23 131 lb (59.4 kg)  07/04/23 133 lb (60.3 kg)  06/30/23 132 lb 8 oz (60.1 kg)    GEN: Well nourished, well developed in no acute distress NECK: No JVD; No carotid bruits CARDIAC: RRR, no murmurs, rubs, gallops RESPIRATORY:  Clear to auscultation without rales, wheezing or rhonchi  ABDOMEN: Soft, non-tender, non-distended EXTREMITIES:  No edema; No deformity   ASSESSMENT AND PLAN: .   Coronary artery disease with prior  PCI to the LAD and RCA.  He continues to deny any anginal or anginal equivalents.  EKG today reveals sinus rhythm at a rate of 77 with T wave abnormalities as no acute change from prior study.  No further ischemic testing is needed at this time.  He has been continued on aspirin  81 mg daily, clopidogrel  75 mg daily, atorvastatin  80 mg daily.  History of orthostatic hypotension with hypertension previously with his history of orthostatic hypotension he was on midodrine  which was subsequently discontinued.  He is exhibiting elevated heart rates at home ranging from the 150s to 180s.  In the office today was 132/88 which is very stable for him.  His losartan  has been increased to 100 mg or 50 mg twice daily so he is taking it.  Labs were stable.  He has been encouraged to continue to monitor his pressure 1 to 2 hours postmedication administration as well.    PVCs/PACs with occasional  palpitations previously had large PVCs burden on remote monitoring PACs on his EKG that has improved with his blood pressure has been started on Toprol-XL 25 mg daily.  New prescription was sent in to the patient's pharmacy of choice.  Previously beta-blocker therapy had been unable to be added secondary to his orthostatic hypotension.  EP had also evaluated him back in 2023 and recommended conservative therapy.  Recent echocardiogram demonstrated preserved LV systolic function a Lexiscan  MPI showed no evidence of ischemia.  Mixed hyperlipidemia with an LDL it is continued to remain at goal.  He is continued on atorvastatin  80 mg daily.  CKD stage III with his last serum creatinine 1.36.  Kidney function has remained stable.  He continues to be followed by nephrology.  Also advised to continue to avoid NSAIDs.  Obstructive sleep apnea where he was intolerant to CPAP.  He had an inspire device implanted in 2019.  He has had no reoccurrence of symptoms since that time.       Dispo: Patient return to clinic to see MD/APP in 6 weeks or sooner if needed for reevaluation of blood pressure after initiating Toprol-XL.  Signed, Liviah Cake, NP

## 2023-08-09 NOTE — Patient Instructions (Addendum)
 Medication Instructions:  Your physician recommends the following medication changes.    START TAKING: TOPROL 25 MG DAILY  *If you need a refill on your cardiac medications before your next appointment, please call your pharmacy*  Lab Work: No labs ordered today  If you have labs (blood work) drawn today and your tests are completely normal, you will receive your results only by: MyChart Message (if you have MyChart) OR A paper copy in the mail If you have any lab test that is abnormal or we need to change your treatment, we will call you to review the results.  Testing/Procedures: No test ordered today   Follow-Up: At [facility], you and your health needs are our priority.  As part of our continuing mission to provide you with exceptional heart care, our providers are all part of one team.  This team includes your primary Cardiologist (physician) and Advanced Practice Providers or APPs (Physician Assistants and Nurse Practitioners) who all work together to provide you with the care you need, when you need it.  Your next appointment:   6 week(s)  Provider:   You may see Timothy Gollan, MD or one of the following Advanced Practice Providers on your designated Care Team:   Laneta Pintos, NP Gildardo Labrador, PA-C Varney Gentleman, PA-C Cadence McConnell, PA-C Ronald Cockayne, NP Morey Ar, NP

## 2023-08-15 ENCOUNTER — Other Ambulatory Visit (INDEPENDENT_AMBULATORY_CARE_PROVIDER_SITE_OTHER)

## 2023-08-15 DIAGNOSIS — R7401 Elevation of levels of liver transaminase levels: Secondary | ICD-10-CM

## 2023-08-15 DIAGNOSIS — R7989 Other specified abnormal findings of blood chemistry: Secondary | ICD-10-CM | POA: Diagnosis not present

## 2023-08-15 LAB — CBC WITH DIFFERENTIAL/PLATELET
Basophils Absolute: 0.1 10*3/uL (ref 0.0–0.1)
Basophils Relative: 1 % (ref 0.0–3.0)
Eosinophils Absolute: 0.2 10*3/uL (ref 0.0–0.7)
Eosinophils Relative: 3.2 % (ref 0.0–5.0)
HCT: 37.9 % — ABNORMAL LOW (ref 39.0–52.0)
Hemoglobin: 12.4 g/dL — ABNORMAL LOW (ref 13.0–17.0)
Lymphocytes Relative: 24 % (ref 12.0–46.0)
Lymphs Abs: 1.3 10*3/uL (ref 0.7–4.0)
MCHC: 32.8 g/dL (ref 30.0–36.0)
MCV: 102.1 fl — ABNORMAL HIGH (ref 78.0–100.0)
Monocytes Absolute: 0.3 10*3/uL (ref 0.1–1.0)
Monocytes Relative: 5.3 % (ref 3.0–12.0)
Neutro Abs: 3.7 10*3/uL (ref 1.4–7.7)
Neutrophils Relative %: 66.5 % (ref 43.0–77.0)
Platelets: 235 10*3/uL (ref 150.0–400.0)
RBC: 3.72 Mil/uL — ABNORMAL LOW (ref 4.22–5.81)
RDW: 14.9 % (ref 11.5–15.5)
WBC: 5.5 10*3/uL (ref 4.0–10.5)

## 2023-08-15 LAB — HEPATIC FUNCTION PANEL
ALT: 43 U/L (ref 0–53)
AST: 47 U/L — ABNORMAL HIGH (ref 0–37)
Albumin: 3.9 g/dL (ref 3.5–5.2)
Alkaline Phosphatase: 55 U/L (ref 39–117)
Bilirubin, Direct: 0.1 mg/dL (ref 0.0–0.3)
Total Bilirubin: 0.6 mg/dL (ref 0.2–1.2)
Total Protein: 5.9 g/dL — ABNORMAL LOW (ref 6.0–8.3)

## 2023-08-16 ENCOUNTER — Encounter: Payer: Self-pay | Admitting: Family Medicine

## 2023-08-17 LAB — HEPATITIS PANEL, ACUTE
Hep A IgM: NONREACTIVE
Hep B C IgM: NONREACTIVE
Hepatitis B Surface Ag: NONREACTIVE
Hepatitis C Ab: NONREACTIVE

## 2023-08-17 LAB — IRON,TIBC AND FERRITIN PANEL
%SAT: 32 % (ref 20–48)
Ferritin: 337 ng/mL (ref 24–380)
Iron: 70 ug/dL (ref 50–180)
TIBC: 222 ug/dL — ABNORMAL LOW (ref 250–425)

## 2023-08-21 ENCOUNTER — Other Ambulatory Visit: Payer: Self-pay | Admitting: Family Medicine

## 2023-08-21 NOTE — Telephone Encounter (Signed)
 Last office visit 06/30/2023 for elevated blood sugar.  Last refilled 07/25/2023 for #30 with no refills.  Next Appt: CPE 04/16/24.

## 2023-08-27 ENCOUNTER — Other Ambulatory Visit: Payer: Self-pay | Admitting: Family Medicine

## 2023-08-28 ENCOUNTER — Other Ambulatory Visit: Payer: Self-pay

## 2023-08-28 ENCOUNTER — Encounter: Payer: Self-pay | Admitting: Emergency Medicine

## 2023-08-28 ENCOUNTER — Emergency Department

## 2023-08-28 ENCOUNTER — Emergency Department
Admission: EM | Admit: 2023-08-28 | Discharge: 2023-08-28 | Disposition: A | Discharge status: Home / Self Care | Attending: Emergency Medicine | Admitting: Emergency Medicine

## 2023-08-28 DIAGNOSIS — N1831 Chronic kidney disease, stage 3a: Secondary | ICD-10-CM | POA: Diagnosis not present

## 2023-08-28 DIAGNOSIS — E039 Hypothyroidism, unspecified: Secondary | ICD-10-CM | POA: Diagnosis not present

## 2023-08-28 DIAGNOSIS — S51012A Laceration without foreign body of left elbow, initial encounter: Secondary | ICD-10-CM | POA: Insufficient documentation

## 2023-08-28 DIAGNOSIS — W010XXA Fall on same level from slipping, tripping and stumbling without subsequent striking against object, initial encounter: Secondary | ICD-10-CM | POA: Insufficient documentation

## 2023-08-28 DIAGNOSIS — W19XXXA Unspecified fall, initial encounter: Secondary | ICD-10-CM

## 2023-08-28 DIAGNOSIS — M1712 Unilateral primary osteoarthritis, left knee: Secondary | ICD-10-CM | POA: Diagnosis not present

## 2023-08-28 DIAGNOSIS — S5002XA Contusion of left elbow, initial encounter: Secondary | ICD-10-CM | POA: Diagnosis not present

## 2023-08-28 DIAGNOSIS — M25562 Pain in left knee: Secondary | ICD-10-CM | POA: Diagnosis not present

## 2023-08-28 DIAGNOSIS — M79632 Pain in left forearm: Secondary | ICD-10-CM | POA: Diagnosis not present

## 2023-08-28 DIAGNOSIS — I2510 Atherosclerotic heart disease of native coronary artery without angina pectoris: Secondary | ICD-10-CM | POA: Diagnosis not present

## 2023-08-28 DIAGNOSIS — S8002XA Contusion of left knee, initial encounter: Secondary | ICD-10-CM | POA: Diagnosis not present

## 2023-08-28 DIAGNOSIS — S59902A Unspecified injury of left elbow, initial encounter: Secondary | ICD-10-CM | POA: Diagnosis present

## 2023-08-28 NOTE — ED Provider Notes (Signed)
 [facility] Provider Note    Event Date/Time   First MD Initiated Contact with Patient 08/28/23 1550     (approximate)   History   Fall   HPI  Shawn Lam is a 73 y.o. male who presents today for evaluation after a trip and fall.  Patient reports that he turned around quickly and lost his balance and fell onto his left arm.  He reports that he scraped his elbow.  There was no head strike or LOC.  Patient denies any headache, neck pain, visual changes, or vomiting.  He reports that he has pain to his left forearm only where he has sustained an abrasion.  He denies any preceding symptoms.  He reports that his tetanus is up-to-date.  He is still able to move his arm normally.  He has been able to ambulate since the event.  Patient Active Problem List   Diagnosis Date Noted   Elevated blood sugar 06/30/2023   Carotid stenosis 05/11/2023   Spinal stenosis in cervical region 03/13/2023   Osteoarthritis of spine with radiculopathy, cervical region 07/14/2022   Cervicogenic headache 11/30/2021   Chronic neck pain 11/30/2021   Dysphagia    Polyp of colon    Prostatic hyperplasia 08/25/2021   Chronic insomnia 03/19/2020   Peripheral neuropathy 03/19/2020   Iron deficiency anemia 01/06/2020   Allergic dermatitis 07/08/2019   Osteoarthritis of left knee 02/26/2019   History of colonic polyps    History of duodenal ulcer    CKD (chronic kidney disease), stage IIIa    Bilateral chronic knee pain 11/09/2018   Epistaxis 10/02/2018   Subjective tinnitus, bilateral 10/02/2018   Chronic pain of right thumb 02/22/2018   Acute bilateral thoracic back pain 01/23/2018   Osteoporosis without current pathological fracture 02/10/2017   Hypothyroidism 11/02/2016   Thoracic compression fracture (HCC) 09/06/2016   PVC's (premature ventricular contractions) 07/07/2016   Hypoalbuminemia 06/28/2016   IgA nephropathy 06/07/2016   MDD (major depressive disorder), recurrent  episode, moderate (HCC) 11/20/2015   OSA (obstructive sleep apnea) 02/05/2014   Orthostatic hypotension 02/07/2013   Scotoma involving central area 01/29/2013   Optic neuropathy 01/28/2013   HEMATURIA UNSPECIFIED 01/21/2010   HLD (hyperlipidemia) 07/05/2006   Generalized anxiety disorder 07/05/2006   CAD (coronary artery disease) 07/05/2006   GERD 07/05/2006          Physical Exam   Triage Vital Signs: ED Triage Vitals  Encounter Vitals Group     BP 08/28/23 1348 (!) 154/94     Systolic BP Percentile --      Diastolic BP Percentile --      Pulse Rate 08/28/23 1348 65     Resp 08/28/23 1348 16     Temp 08/28/23 1348 98.6 F (37 C)     Temp Source 08/28/23 1348 Oral     SpO2 08/28/23 1348 100 %     Weight 08/28/23 1349 140 lb (63.5 kg)     Height 08/28/23 1349 6' (1.829 m)     Head Circumference --      Peak Flow --      Pain Score 08/28/23 1348 7     Pain Loc --      Pain Education --      Exclude from Growth Chart --     Most recent vital signs: Vitals:   08/28/23 1348  BP: (!) 154/94  Pulse: 65  Resp: 16  Temp: 98.6 F (37 C)  SpO2: 100%  Physical Exam Vitals and nursing note reviewed.  Constitutional:      General: Awake and alert. No acute distress.    Appearance: Normal appearance. The patient is normal weight.  HENT:     Head: Normocephalic and atraumatic.     Mouth: Mucous membranes are moist.  Eyes:     General: PERRL. Normal EOMs        Right eye: No discharge.        Left eye: No discharge.     Conjunctiva/sclera: Conjunctivae normal.  Cardiovascular:     Rate and Rhythm: Normal rate and regular rhythm.     Pulses: Normal pulses.  Pulmonary:     Effort: Pulmonary effort is normal. No respiratory distress.     Breath sounds: Normal breath sounds.  Abdominal:     Abdomen is soft. There is no abdominal tenderness. No rebound or guarding. No distention. Musculoskeletal:        General: No swelling. Normal range of motion.     Cervical  back: Normal range of motion and neck supple. No midline cervical spine tenderness.  Full range of motion of neck.  Negative Spurling test.  Negative Lhermitte sign.  Normal strength and sensation in bilateral upper extremities. Normal grip strength bilaterally.  Normal intrinsic muscle function of the hand bilaterally.  Normal radial pulses bilaterally. Left upper extremity: Full and normal range of motion of shoulder, elbow, wrist.  V shaped skin tear noted to proximal forearm, just distal to olecranon.  Wound is hemostatic.  Normal radial pulse.  Normal grip strength.  Normal intrinsic muscle function of the hand.  Compartment soft and compressible throughout. Skin:    General: Skin is warm and dry.     Capillary Refill: Capillary refill takes less than 2 seconds.     Findings: No rash.  Neurological:     Mental Status: The patient is awake and alert.   Neurological: GCS 15 alert and oriented x3 Normal speech, no expressive or receptive aphasia or dysarthria Cranial nerves II through XII intact Normal visual fields 5 out of 5 strength in all 4 extremities with intact sensation throughout No extremity drift Normal finger-to-nose testing, no limb or truncal ataxia    ED Results / Procedures / Treatments   Labs (all labs ordered are listed, but only abnormal results are displayed) Labs Reviewed - No data to display   EKG     RADIOLOGY I independently reviewed and interpreted imaging and agree with radiologists findings.     PROCEDURES:  Critical Care performed:   Procedures   MEDICATIONS ORDERED IN ED: Medications - No data to display   IMPRESSION / MDM / ASSESSMENT AND PLAN / ED COURSE  I reviewed the triage vital signs and the nursing notes.   Differential diagnosis includes, but is not limited to, contusion, abrasion, laceration, fracture.  Patient is awake and alert dynamically stable and afebrile.  He is neurologically and neurovascularly intact.  X-rays  obtained in triage are negative very.  He has a superficial skin tear to his proximal forearm.  This was cleaned and closed with Steri-Strips and skin glue.  We also discussed the option of CT imaging of his head and neck given that he is anticoagulated, though he is certain that he did not hit his head and does not have a headache or any neck pain.  He does not wish to have a CT today.  We discussed wound care and return precautions.  Patient understands and agrees with plan.  He was discharged in stable condition.   Patient's presentation is most consistent with acute complicated illness / injury requiring diagnostic workup.      FINAL CLINICAL IMPRESSION(S) / ED DIAGNOSES   Final diagnoses:  Fall, initial encounter  Elbow laceration, left, initial encounter  Contusion of left knee, initial encounter  Contusion of left elbow, initial encounter     Rx / DC Orders   ED Discharge Orders     None        Note:  This document was prepared using Dragon voice recognition software and may include unintentional dictation errors.   Horace Lukas E, PA-C 08/28/23 1614    Kandee Orion, MD 08/28/23 2200

## 2023-08-28 NOTE — ED Provider Triage Note (Signed)
 Emergency Medicine Provider Triage Evaluation Note  Shawn Lam , a 73 y.o. male  was evaluated in triage.  Pt complains of mechanical fall that occurred today.  Patient complains of left knee and forearm pain.  No head injury or loss of consciousness.  Patient has been ambulatory since accident.  Review of Systems  Positive: Left knee and forearm pain Negative: No loss consciousness.  Physical Exam  BP (!) 154/94 (BP Location: Right Arm)   Pulse 65   Temp 98.6 F (37 C) (Oral)   Resp 16   Ht 6' (1.829 m)   Wt 63.5 kg   SpO2 100%   BMI 18.99 kg/m  Gen:   Awake, no distress alert, talkative, ambulatory without any assistance. Resp:  Normal effort lungs clear bilaterally. MSK:   Moves extremities without difficulty lower extremity.  Tenderness left forearm without obvious deformity.  Minimal ecchymosis present. Medical Decision Making  Medically screening exam initiated at 1:52 PM.  Appropriate orders placed.  Shawn Lam was informed that the remainder of the evaluation will be completed by another provider, this initial triage assessment does not replace that evaluation, and the importance of remaining in the ED until their evaluation is complete.     Shawn Eagles, PA-C 08/28/23 1355

## 2023-08-28 NOTE — Discharge Instructions (Signed)
 Your x-rays were normal.  Please keep your wound clean and dry.  Wash with soap and water .  The skin glue and the Steri-Strips will fall off on their own.  Please return for any new, worsening, or changing symptoms or other concerns.  It was a pleasure caring for you today.

## 2023-08-28 NOTE — ED Triage Notes (Signed)
 Pt to ED via POV. Pt states that he fell this morning. Pt states that he is unsure how he fell but he does remember it. Pt states that he did not hit his head. Pt is on blood thinners. Pt is having pain in her left arm and knee. Pt is in NAD. Pt ambulatory to triage.

## 2023-08-29 ENCOUNTER — Encounter (INDEPENDENT_AMBULATORY_CARE_PROVIDER_SITE_OTHER): Payer: Self-pay

## 2023-08-29 DIAGNOSIS — N1831 Chronic kidney disease, stage 3a: Secondary | ICD-10-CM | POA: Diagnosis not present

## 2023-08-29 DIAGNOSIS — N2581 Secondary hyperparathyroidism of renal origin: Secondary | ICD-10-CM | POA: Diagnosis not present

## 2023-08-29 DIAGNOSIS — R809 Proteinuria, unspecified: Secondary | ICD-10-CM | POA: Diagnosis not present

## 2023-08-29 DIAGNOSIS — D631 Anemia in chronic kidney disease: Secondary | ICD-10-CM | POA: Diagnosis not present

## 2023-09-05 DIAGNOSIS — R809 Proteinuria, unspecified: Secondary | ICD-10-CM | POA: Diagnosis not present

## 2023-09-05 DIAGNOSIS — N2581 Secondary hyperparathyroidism of renal origin: Secondary | ICD-10-CM | POA: Diagnosis not present

## 2023-09-05 DIAGNOSIS — N182 Chronic kidney disease, stage 2 (mild): Secondary | ICD-10-CM | POA: Diagnosis not present

## 2023-09-05 DIAGNOSIS — D631 Anemia in chronic kidney disease: Secondary | ICD-10-CM | POA: Diagnosis not present

## 2023-09-08 ENCOUNTER — Ambulatory Visit: Admitting: Family Medicine

## 2023-09-15 DIAGNOSIS — Z049 Encounter for examination and observation for unspecified reason: Secondary | ICD-10-CM | POA: Diagnosis not present

## 2023-09-15 DIAGNOSIS — G43719 Chronic migraine without aura, intractable, without status migrainosus: Secondary | ICD-10-CM | POA: Diagnosis not present

## 2023-09-18 ENCOUNTER — Other Ambulatory Visit: Payer: Self-pay | Admitting: Specialist

## 2023-09-18 DIAGNOSIS — G43719 Chronic migraine without aura, intractable, without status migrainosus: Secondary | ICD-10-CM

## 2023-09-19 DIAGNOSIS — M791 Myalgia, unspecified site: Secondary | ICD-10-CM | POA: Diagnosis not present

## 2023-09-19 DIAGNOSIS — G518 Other disorders of facial nerve: Secondary | ICD-10-CM | POA: Diagnosis not present

## 2023-09-19 DIAGNOSIS — M542 Cervicalgia: Secondary | ICD-10-CM | POA: Diagnosis not present

## 2023-09-19 DIAGNOSIS — G43719 Chronic migraine without aura, intractable, without status migrainosus: Secondary | ICD-10-CM | POA: Diagnosis not present

## 2023-09-19 DIAGNOSIS — Z79899 Other long term (current) drug therapy: Secondary | ICD-10-CM | POA: Diagnosis not present

## 2023-09-20 ENCOUNTER — Encounter: Payer: Self-pay | Admitting: Cardiology

## 2023-09-20 ENCOUNTER — Ambulatory Visit: Attending: Cardiology | Admitting: Cardiology

## 2023-09-20 VITALS — BP 112/80 | HR 64 | Ht 72.0 in | Wt 128.5 lb

## 2023-09-20 DIAGNOSIS — N183 Chronic kidney disease, stage 3 unspecified: Secondary | ICD-10-CM | POA: Insufficient documentation

## 2023-09-20 DIAGNOSIS — I10 Essential (primary) hypertension: Secondary | ICD-10-CM | POA: Diagnosis not present

## 2023-09-20 DIAGNOSIS — I491 Atrial premature depolarization: Secondary | ICD-10-CM | POA: Diagnosis not present

## 2023-09-20 DIAGNOSIS — Z8679 Personal history of other diseases of the circulatory system: Secondary | ICD-10-CM | POA: Diagnosis not present

## 2023-09-20 DIAGNOSIS — G4733 Obstructive sleep apnea (adult) (pediatric): Secondary | ICD-10-CM | POA: Insufficient documentation

## 2023-09-20 DIAGNOSIS — I493 Ventricular premature depolarization: Secondary | ICD-10-CM | POA: Diagnosis not present

## 2023-09-20 DIAGNOSIS — I25118 Atherosclerotic heart disease of native coronary artery with other forms of angina pectoris: Secondary | ICD-10-CM | POA: Diagnosis not present

## 2023-09-20 DIAGNOSIS — I6521 Occlusion and stenosis of right carotid artery: Secondary | ICD-10-CM | POA: Insufficient documentation

## 2023-09-20 DIAGNOSIS — E782 Mixed hyperlipidemia: Secondary | ICD-10-CM | POA: Insufficient documentation

## 2023-09-20 NOTE — Progress Notes (Signed)
 Cardiology Office Note   Date:  09/20/2023  ID:  Shawn Lam, DOB [birth year], MRN [id number] PCP: Judithann Novas, MD  [facility] Providers Cardiologist:  Belva Boyden, MD     History of Present Illness Shawn Lam is a 73 y.o. male with a past medical history of coronary artery disease status post PCI to the RCA, recurrent syncope, secondary orthostatic hypotension, frequent PVCs, hyperlipidemia, mildly dilated aortic root, CKD stage III, iron deficiency anemia, hypothyroidism, OSA status post inspire device, and lymphedema, who presents today for follow-up of his coronary artery disease.   Cardiac MRI 10/2013 demonstrated normal LVEF and RVEF with no evidence of LGE.  He underwent loop recorder implantation in 05/2016 with device interrogation showed no evidence of significant arrhythmia, prolonged pauses, or high-grade AV block.  Lexiscan  MPI completed in 11/2018 showed no evidence of ischemia and was overall low risk with an EF of 55 to 65%.  Echocardiogram from 11/2018 demonstrated an EF of 60-65%, diastolic dysfunction, no RWMA, normal RV systolic function, and aortic valve to be normal, no significant valvular abnormalities.  He was seen in the office 04/2021 and noted a syncopal episode around Christmas in 2022 associated with standing.  He also reported palpitations while lying down at night.  He remained on midodrine  10 mg 3 times daily.  He was evaluated again in April 2023 for recurrent syncope and preoperative cardiac risk stratification for TURP.  Symptoms of syncope continued in the context of positional dizziness.  He was without symptoms of angina or decompensation.  He underwent repeat event monitoring which demonstrated predominant rhythm was sinus with an average heart rate of 60 bpm, 11 episodes of SVT frequent PVCs with a burden of 8.4, given PVC burden in the context of significant orthostasis he was referred to EP.  Repeat echocardiogram demonstrated EF of 50%,  global hypokinesis, normal RV systolic function, and estimated right atrial pressure of 15 mmHg.  EP evaluation was completed 08/28/2021 with a conservative approach recommended given the burden of less than 10% PVCs.  Subsequent Lexiscan  MPI in 09/2021 showed no evidence of ischemia.  Echo 1 month prior demonstrated preserved LV systolic function, LAD and RCA calcifications were noted on CT attenuation corrected images but overall was considered a low risk study.   He was admitted 03/13/2023 for cervical spondylosis with radiculopathy further C4-5 and C5-6.  He tolerated surgery well and was taken to the recovery room in the floor in stable condition.  Hospital course was without complications.  Dressing remained clean dry and intact.  Considered stable for discharge and was discharged on 03/14/2023.   He was evaluated at [facility] emergency department on 03/21/2023 with complaint of headache.  He been experiencing for last 2 to 3 days.  Blood pressure was suboptimally controlled at 153/101 and 169/98.  He was treated with fentanyl , Zofran , and normal saline. Imaging revealed no evidence of injury.He was sent home with a Zofran  prescription.   He was evaluated in cardiology clinic 05/01/2023 stating that he felt well.  He had recently been started on medication for migraines from neurology that help with his headaches.  He had also followed up with nephrology since being off of blood pressure medications.  He was started on losartan  12.5 mg daily and scheduled for repeat BMP in 2 weeks after starting to reassess his kidney function.  He was evaluated in clinic 07/04/2023 in order to have a cardiac perspective.  Blood pressure log at home his blood pressure running  130s up to 200s systolic.  He was continued on losartan  50 mg today with additional 50 mg at bedtime if his blood pressures greater than 150 mmHg.  He is also scheduled for repeat labs on his return visit.   He was last seen in clinic 08/09/2023.  At that  time he been doing well from a cardiac perspective.  He denies chest pain, shortness breath, dyspnea exertion or palpitations.  No indication elevated blood pressure but continues to monitor his pressure at home.  Recently was increased on losartan  100 mg as he is taking 50 mg twice a day.  He also noted an uptake in his heart rate at that time.  He was restarted on low-dose beta-blocker therapy Toprol -XL 25 mg daily.  For his palpitations PACs and PVCs.  He was evaluated in [facility] 08/28/2023 status post mechanical fall.  He denies any loss of consciousness or hitting his head.  Continue to have pain in his left forearm after his fall he stated that he tripped quickly and lost his balance and fell onto his left arm.  Blood pressure was slightly elevated at 150/94.  X-rays obtained in triage were negative.  He had a superficial skin tear to his proximal forearm.  Was cleaned and closed with Steri-Strips.  He was able to be discharged home without further workup.  He returns to clinic today stating that overall from a cardiac perspective he has been doing well.  He continues to state that his blood pressure does fluctuate as his blood pressure is 112 on arrival today but prior to leaving home it was 127.  He states that he has been taking his losartan  50 mg and 150 mg in the evening as well as his occasional Toprol -XL.  He denies any recurrent palpitations since starting low-dose beta-blocker therapy.  States that he has been compliant with his current medication regimen without any adverse effects.  He stated that he did sustain a fall where he was evaluated as he had worried he had broken his arm.  He denies hitting his head or LOC.  ROS: 10 point review of systems has been reviewed and considered negative except ones been listed in the HPI  Studies Reviewed     Lexiscan  MPI 09/22/2021:   The study is low risk.   There is no evidence of ischemia   Left ventricular function visually appears normal.  Calculated EF is 39%. correlation with echo advised   LAD and RCA calcifications noted   Image quality degraded by diaphragmatic artifacts.   2D echo 08/19/2021: 1. Left ventricular ejection fraction, by estimation, is 50% . The left  ventricle has low normal function. The left ventricle demonstrates global  hypokinesis.   2. Right ventricular systolic function is normal. The right ventricular  size is normal. There is normal pulmonary artery systolic pressure. The  estimated right ventricular systolic pressure is 35.2 mmHg.   3. The mitral valve is normal in structure. Mild mitral valve  regurgitation. No evidence of mitral stenosis.   4. The aortic valve was not well visualized. Aortic valve regurgitation  is not visualized. No aortic stenosis is present.   5. The inferior vena cava is dilated in size with <50% respiratory  variability, suggesting right atrial pressure of 15 mmHg.   6. PVCs noted   Comparison(s): EF 60-65%. Risk Assessment/Calculations           Physical Exam VS:  BP 112/80 (BP Location: Left Arm, Patient Position: Sitting, Cuff Size: Normal)  Pulse 64   Ht 6' (1.829 m)   Wt 128 lb 8 oz (58.3 kg)   SpO2 99%   BMI 17.43 kg/m    Wt Readings from Last 3 Encounters:  09/20/23 128 lb 8 oz (58.3 kg)  08/28/23 140 lb (63.5 kg)  08/09/23 131 lb (59.4 kg)    GEN: Well nourished, well developed in no acute distress NECK: No JVD; No carotid bruits CARDIAC: RRR, no murmurs, rubs, gallops RESPIRATORY:  Clear to auscultation without rales, wheezing or rhonchi  ABDOMEN: Soft, non-tender, non-distended EXTREMITIES:  No edema; No deformity   ASSESSMENT AND PLAN Coronary artery disease native coronary artery without angina status post prior PCI to the LAD and RCA.  He continues to deny any angina or anginal equivalents.  He is continued on aspirin  81 mg daily, clopidogrel  75 mg daily, atorvastatin  80 mg daily.  No further ischemic testing is needed at this  time.  History of orthostatic hypotension with hypertension.  Blood pressure today 112/80.  Blood pressure has been improved looking at his log where he brought from home.  He is continued on losartan  50 mg twice daily metoprolol  XL 25 mg daily.  He has been encouraged to continue to monitor his blood pressures 1 to 2 hours postmedication administration as well.  PVCs/PACs with occasional palpitations that have resolved since starting on low-dose metoprolol .  If he continues to have reoccurrence of symptoms or is unable to continue with beta-blocker therapy due to orthostatic hypotension.  Will send him back for further evaluation by EP.  Mixed hyperlipidemia where his LDLs continue to remain in goal.  He is continued on atorvastatin  80 mg daily.  CKD stage III with stable kidney function of 1.3.  He is continue to be followed by nephrology.  He is advised to continue to avoid NSAIDs.  Obstructive sleep apnea where he is intolerant to CPAP.  Had an inspire device implanted in 2019.  Has had no reoccurrence of symptoms since that time.  Patient continues to suffer from migraines he has been followed by neurology and has an upcoming MRI.  Carotid stenosis primarily in the right carotid where he has updated carotid scheduled and will continue to follow with [facility].       Dispo: Patient return to clinic to see MD/APP in 3 months or sooner if needed for reevaluation of symptoms.  Signed, Britton Bera, NP

## 2023-09-20 NOTE — Patient Instructions (Signed)
 Medication Instructions:  Your physician recommends that you continue on your current medications as directed. Please refer to the Current Medication list given to you today.   *If you need a refill on your cardiac medications before your next appointment, please call your pharmacy*  Lab Work: No labs ordered today  If you have labs (blood work) drawn today and your tests are completely normal, you will receive your results only by: MyChart Message (if you have MyChart) OR A paper copy in the mail If you have any lab test that is abnormal or we need to change your treatment, we will call you to review the results.  Testing/Procedures: No test ordered today   Follow-Up: At [facility], you and your health needs are our priority.  As part of our continuing mission to provide you with exceptional heart care, our providers are all part of one team.  This team includes your primary Cardiologist (physician) and Advanced Practice Providers or APPs (Physician Assistants and Nurse Practitioners) who all work together to provide you with the care you need, when you need it.  Your next appointment:   3 month(s)  Provider:   Timothy Gollan, MD

## 2023-09-21 ENCOUNTER — Other Ambulatory Visit: Payer: Self-pay | Admitting: Neurosurgery

## 2023-09-21 DIAGNOSIS — M546 Pain in thoracic spine: Secondary | ICD-10-CM

## 2023-09-21 DIAGNOSIS — M542 Cervicalgia: Secondary | ICD-10-CM | POA: Diagnosis not present

## 2023-09-21 DIAGNOSIS — Z681 Body mass index (BMI) 19 or less, adult: Secondary | ICD-10-CM | POA: Diagnosis not present

## 2023-09-22 ENCOUNTER — Ambulatory Visit
Admission: RE | Admit: 2023-09-22 | Discharge: 2023-09-22 | Disposition: A | Discharge status: Home / Self Care | Source: Ambulatory Visit | Attending: Specialist | Admitting: Specialist

## 2023-09-22 DIAGNOSIS — G43719 Chronic migraine without aura, intractable, without status migrainosus: Secondary | ICD-10-CM | POA: Insufficient documentation

## 2023-09-22 DIAGNOSIS — R519 Headache, unspecified: Secondary | ICD-10-CM | POA: Diagnosis not present

## 2023-09-22 DIAGNOSIS — R9089 Other abnormal findings on diagnostic imaging of central nervous system: Secondary | ICD-10-CM | POA: Diagnosis not present

## 2023-09-22 DIAGNOSIS — R9082 White matter disease, unspecified: Secondary | ICD-10-CM | POA: Diagnosis not present

## 2023-09-22 MED ORDER — GADOBUTROL 1 MMOL/ML IV SOLN
5.0000 mL | Freq: Once | INTRAVENOUS | Status: AC | PRN
Start: 2023-09-22 — End: 2023-09-22
  Administered 2023-09-22: 5 mL via INTRAVENOUS

## 2023-09-26 ENCOUNTER — Encounter: Payer: Self-pay | Admitting: Dermatology

## 2023-09-26 ENCOUNTER — Ambulatory Visit: Payer: Medicare Other | Admitting: Dermatology

## 2023-09-26 DIAGNOSIS — Z872 Personal history of diseases of the skin and subcutaneous tissue: Secondary | ICD-10-CM | POA: Diagnosis not present

## 2023-09-26 DIAGNOSIS — L814 Other melanin hyperpigmentation: Secondary | ICD-10-CM | POA: Diagnosis not present

## 2023-09-26 DIAGNOSIS — L820 Inflamed seborrheic keratosis: Secondary | ICD-10-CM

## 2023-09-26 DIAGNOSIS — Z85828 Personal history of other malignant neoplasm of skin: Secondary | ICD-10-CM

## 2023-09-26 DIAGNOSIS — L578 Other skin changes due to chronic exposure to nonionizing radiation: Secondary | ICD-10-CM | POA: Diagnosis not present

## 2023-09-26 DIAGNOSIS — D229 Melanocytic nevi, unspecified: Secondary | ICD-10-CM

## 2023-09-26 DIAGNOSIS — L821 Other seborrheic keratosis: Secondary | ICD-10-CM

## 2023-09-26 DIAGNOSIS — W908XXA Exposure to other nonionizing radiation, initial encounter: Secondary | ICD-10-CM

## 2023-09-26 DIAGNOSIS — L570 Actinic keratosis: Secondary | ICD-10-CM | POA: Diagnosis not present

## 2023-09-26 DIAGNOSIS — Z1283 Encounter for screening for malignant neoplasm of skin: Secondary | ICD-10-CM

## 2023-09-26 DIAGNOSIS — D1801 Hemangioma of skin and subcutaneous tissue: Secondary | ICD-10-CM | POA: Diagnosis not present

## 2023-09-26 DIAGNOSIS — D692 Other nonthrombocytopenic purpura: Secondary | ICD-10-CM

## 2023-09-26 NOTE — Progress Notes (Signed)
 Follow-Up Visit   Subjective  Shawn Lam is a 73 y.o. male who presents for the following: Skin Cancer Screening and Full Body Skin Exam Spots at forehead and scalp  Hx of bcc  Hx aks and isks  The patient presents for Total-Body Skin Exam (TBSE) for skin cancer screening and mole check. The patient has spots, moles and lesions to be evaluated, some may be new or changing and the patient may have concern these could be cancer.  The following portions of the chart were reviewed this encounter and updated as appropriate: medications, allergies, medical history  Review of Systems:  No other skin or systemic complaints except as noted in HPI or Assessment and Plan.  Objective  Well appearing patient in no apparent distress; mood and affect are within normal limits.  A full examination was performed including scalp, head, eyes, ears, nose, lips, neck, chest, axillae, abdomen, back, buttocks, bilateral upper extremities, bilateral lower extremities, hands, feet, fingers, toes, fingernails, and toenails. All findings within normal limits unless otherwise noted below.   Relevant physical exam findings are noted in the Assessment and Plan.  Scalp x 10 (10) Erythematous stuck-on, waxy papule or plaque face x 17 (17) Erythematous thin papules/macules with gritty scale.   Assessment & Plan   SKIN CANCER SCREENING PERFORMED TODAY.  ACTINIC DAMAGE - Chronic condition, secondary to cumulative UV/sun exposure - diffuse scaly erythematous macules with underlying dyspigmentation - Recommend daily broad spectrum sunscreen SPF 30+ to sun-exposed areas, reapply every 2 hours as needed.  - Staying in the shade or wearing long sleeves, sun glasses (UVA+UVB protection) and wide brim hats (4-inch brim around the entire circumference of the hat) are also recommended for sun protection.  - Call for new or changing lesions.  LENTIGINES, SEBORRHEIC KERATOSES, HEMANGIOMAS - Benign normal skin  lesions - Benign-appearing - Call for any changes  MELANOCYTIC NEVI - Tan-brown and/or pink-flesh-colored symmetric macules and papules - Benign appearing on exam today - Observation - Call clinic for new or changing moles - Recommend daily use of broad spectrum spf 30+ sunscreen to sun-exposed areas.   Purpura - Chronic; persistent and recurrent.  Treatable, but not curable. - Violaceous macules and patches - Benign - Related to trauma, age, sun damage and/or use of blood thinners, chronic use of topical and/or oral steroids - Observe - Can use OTC arnica containing moisturizer such as Dermend Bruise Formula if desired - Call for worsening or other concerns  HISTORY OF BASAL CELL CARCINOMA OF THE SKIN 08/05/2020 - left neck infra auricular - nodular pattern - treated with [facility] 09/17/2020 - No evidence of recurrence today - Recommend regular full body skin exams - Recommend daily broad spectrum sunscreen SPF 30+ to sun-exposed areas, reapply every 2 hours as needed.  - Call if any new or changing lesions are noted between office visits  INFLAMED SEBORRHEIC KERATOSIS (10) Scalp x 10 (10) Symptomatic, irritating, patient would like treated. Destruction of lesion - Scalp x 10 (10) Complexity: simple   Destruction method: cryotherapy   Informed consent: discussed and consent obtained   Timeout:  patient name, date of birth, surgical site, and procedure verified Lesion destroyed using liquid nitrogen: Yes   Region frozen until ice ball extended beyond lesion: Yes   Outcome: patient tolerated procedure well with no complications   Post-procedure details: wound care instructions given   ACTINIC KERATOSIS (17) face x 17 (17) Actinic keratoses are precancerous spots that appear secondary to cumulative UV radiation exposure/sun  exposure over time. They are chronic with expected duration over 1 year. A portion of actinic keratoses will progress to squamous cell carcinoma of the skin. It is  not possible to reliably predict which spots will progress to skin cancer and so treatment is recommended to prevent development of skin cancer.  Recommend daily broad spectrum sunscreen SPF 30+ to sun-exposed areas, reapply every 2 hours as needed.  Recommend staying in the shade or wearing long sleeves, sun glasses (UVA+UVB protection) and wide brim hats (4-inch brim around the entire circumference of the hat). Call for new or changing lesions. Destruction of lesion - face x 17 (17) Complexity: simple   Destruction method: cryotherapy   Informed consent: discussed and consent obtained   Timeout:  patient name, date of birth, surgical site, and procedure verified Lesion destroyed using liquid nitrogen: Yes   Region frozen until ice ball extended beyond lesion: Yes   Outcome: patient tolerated procedure well with no complications   Post-procedure details: wound care instructions given   Return in about 9 months (around 06/25/2024) for isk and ak follow up.  [name], CMA, am acting as scribe for Celine Collard, MD.   Documentation: I have reviewed the above documentation for accuracy and completeness, and I agree with the above.  Celine Collard, MD

## 2023-09-26 NOTE — Patient Instructions (Addendum)
Actinic keratoses are precancerous spots that appear secondary to cumulative UV radiation exposure/sun exposure over time. They are chronic with expected duration over 1 year. A portion of actinic keratoses will progress to squamous cell carcinoma of the skin. It is not possible to reliably predict which spots will progress to skin cancer and so treatment is recommended to prevent development of skin cancer.  Recommend daily broad spectrum sunscreen SPF 30+ to sun-exposed areas, reapply every 2 hours as needed.  Recommend staying in the shade or wearing long sleeves, sun glasses (UVA+UVB protection) and wide brim hats (4-inch brim around the entire circumference of the hat). Call for new or changing lesions.      Seborrheic Keratosis  What causes seborrheic keratoses? Seborrheic keratoses are harmless, common skin growths that first appear during adult life.  As time goes by, more growths appear.  Some people may develop a large number of them.  Seborrheic keratoses appear on both covered and uncovered body parts.  They are not caused by sunlight.  The tendency to develop seborrheic keratoses can be inherited.  They vary in color from skin-colored to gray, brown, or even black.  They can be either smooth or have a rough, warty surface.   Seborrheic keratoses are superficial and look as if they were stuck on the skin.  Under the microscope this type of keratosis looks like layers upon layers of skin.  That is why at times the top layer may seem to fall off, but the rest of the growth remains and re-grows.    Treatment Seborrheic keratoses do not need to be treated, but can easily be removed in the office.  Seborrheic keratoses often cause symptoms when they rub on clothing or jewelry.  Lesions can be in the way of shaving.  If they become inflamed, they can cause itching, soreness, or burning.  Removal of a seborrheic keratosis can be accomplished by freezing, burning, or surgery. If any spot bleeds,  scabs, or grows rapidly, please return to have it checked, as these can be an indication of a skin cancer.   Cryotherapy Aftercare  Wash gently with soap and water everyday.   Apply Vaseline and Band-Aid daily until healed.       Melanoma ABCDEs  Melanoma is the most dangerous type of skin cancer, and is the leading cause of death from skin disease.  You are more likely to develop melanoma if you: Have light-colored skin, light-colored eyes, or red or blond hair Spend a lot of time in the sun Tan regularly, either outdoors or in a tanning bed Have had blistering sunburns, especially during childhood Have a close family member who has had a melanoma Have atypical moles or large birthmarks  Early detection of melanoma is key since treatment is typically straightforward and cure rates are extremely high if we catch it early.   The first sign of melanoma is often a change in a mole or a new dark spot.  The ABCDE system is a way of remembering the signs of melanoma.  A for asymmetry:  The two halves do not match. B for border:  The edges of the growth are irregular. C for color:  A mixture of colors are present instead of an even brown color. D for diameter:  Melanomas are usually (but not always) greater than 6mm - the size of a pencil eraser. E for evolution:  The spot keeps changing in size, shape, and color.  Please check your skin once per month  between visits. You can use a small mirror in front and a large mirror behind you to keep an eye on the back side or your body.   If you see any new or changing lesions before your next follow-up, please call to schedule a visit.  Please continue daily skin protection including broad spectrum sunscreen SPF 30+ to sun-exposed areas, reapplying every 2 hours as needed when you're outdoors.   Staying in the shade or wearing long sleeves, sun glasses (UVA+UVB protection) and wide brim hats (4-inch brim around the entire circumference of the  hat) are also recommended for sun protection.    Due to recent changes in healthcare laws, you may see results of your pathology and/or laboratory studies on MyChart before the doctors have had a chance to review them. We understand that in some cases there may be results that are confusing or concerning to you. Please understand that not all results are received at the same time and often the doctors may need to interpret multiple results in order to provide you with the best plan of care or course of treatment. Therefore, we ask that you please give [place] 2 business days to thoroughly review all your results before contacting the office for clarification. Should we see a critical lab result, you will be contacted sooner.   If You Need Anything After Your Visit  If you have any questions or concerns for your doctor, please call our main line at [phone] and press option 4 to reach your doctor's medical assistant. If no one answers, please leave a voicemail as directed and we will return your call as soon as possible. Messages left after 4 pm will be answered the following business day.   You may also send [place] a message via MyChart. We typically respond to MyChart messages within 1-2 business days.  For prescription refills, please ask your pharmacy to contact our office. Our fax number is [phone].  If you have an urgent issue when the clinic is closed that cannot wait until the next business day, you can page your doctor at the number below.    Please note that while we do our best to be available for urgent issues outside of office hours, we are not available 24/7.   If you have an urgent issue and are unable to reach [place], you may choose to seek medical care at your doctor's office, retail clinic, urgent care center, or emergency room.  If you have a medical emergency, please immediately call 911 or go to the emergency department.  Pager Numbers  - Dr. Gwen Pounds: [phone]  - Dr.  Roseanne Reno: [phone]  - Dr. Katrinka Blazing: [phone]   In the event of inclement weather, please call our main line at [phone] for an update on the status of any delays or closures.  Dermatology Medication Tips: Please keep the boxes that topical medications come in in order to help keep track of the instructions about where and how to use these. Pharmacies typically print the medication instructions only on the boxes and not directly on the medication tubes.   If your medication is too expensive, please contact our office at [phone] option 4 or send [place] a message through MyChart.   We are unable to tell what your co-pay for medications will be in advance as this is different depending on your insurance coverage. However, we may be able to find a substitute medication at lower cost or fill out paperwork to get insurance to cover a  needed medication.   If a prior authorization is required to get your medication covered by your insurance company, please allow [place] 1-2 business days to complete this process.  Drug prices often vary depending on where the prescription is filled and some pharmacies may offer cheaper prices.  The website www.goodrx.com contains coupons for medications through different pharmacies. The prices here do not account for what the cost may be with help from insurance (it may be cheaper with your insurance), but the website can give you the price if you did not use any insurance.  - You can print the associated coupon and take it with your prescription to the pharmacy.  - You may also stop by our office during regular business hours and pick up a GoodRx coupon card.  - If you need your prescription sent electronically to a different pharmacy, notify our office through [facility] or by phone at [phone] option 4.     Si Usted Necesita Algo Despus de Su Visita  Tambin puede enviarnos un mensaje a travs de Clinical cytogeneticist. Por lo general respondemos a los  mensajes de MyChart en el transcurso de 1 a 2 das hbiles.  Para renovar recetas, por favor pida a su farmacia que se ponga en contacto con nuestra oficina. Annie Sable de fax es [place] [phone].  Si tiene un asunto urgente cuando la clnica est cerrada y que no puede esperar hasta el siguiente da hbil, puede llamar/localizar a su doctor(a) al nmero que aparece a continuacin.   Por favor, tenga en cuenta que aunque hacemos todo lo posible para estar disponibles para asuntos urgentes fuera del horario de [place], no estamos disponibles las 24 horas del da, los 7 [address]  [address] de la [place].   Si tiene un problema urgente y no puede comunicarse con nosotros, puede optar por buscar atencin mdica  en el consultorio de su doctor(a), en una clnica privada, en un centro de atencin urgente o en una sala de emergencias.  Si tiene Engineer, drilling, por favor llame inmediatamente al 911 o vaya a la sala de emergencias.  Nmeros de bper  - Dr. Gwen Pounds: [phone]  - Dra. Roseanne Reno: [phone]  - Dr. Katrinka Blazing: [phone]   En caso de inclemencias del tiempo, por favor llame a Lacy Duverney principal al [phone] para una actualizacin sobre el [place] de cualquier retraso o cierre.  Consejos para la medicacin en dermatologa: Por favor, guarde las cajas en las que vienen los medicamentos de uso tpico para ayudarle a seguir las instrucciones sobre dnde y cmo usarlos. Las farmacias generalmente imprimen las instrucciones del medicamento slo en las cajas y no directamente en los tubos del [place].   Si su medicamento es muy caro, por favor, pngase en contacto con Rolm Gala llamando al [phone] y presione la opcin 4 o envenos un mensaje a travs de Clinical cytogeneticist.   No podemos decirle cul ser su copago por los medicamentos por adelantado ya que esto es diferente dependiendo de la cobertura de su seguro. Sin embargo, es posible que podamos encontrar un medicamento sustituto a  Audiological scientist un formulario para que el seguro cubra el medicamento que se considera necesario.   Si se requiere una autorizacin previa para que su compaa de seguros [place] su medicamento, por favor permtanos de 1 a 2 das hbiles para completar [address]  Los precios de los medicamentos varan con frecuencia dependiendo del Environmental consultant de dnde se surte la receta y alguna farmacias pueden ofrecer precios ms baratos.  El sitio web www.goodrx.com tiene cupones para medicamentos de Health and safety inspector. Los precios aqu no tienen en cuenta lo que podra costar con la ayuda del seguro (puede ser ms barato con su seguro), pero el sitio web puede darle el precio si no utiliz Tourist information centre manager.  - Puede imprimir el cupn correspondiente y llevarlo con su receta a la farmacia.  - Tambin puede pasar por nuestra oficina durante el horario de atencin regular y Education officer, museum una tarjeta de cupones de GoodRx.  - Si necesita que su receta se enve electrnicamente a una farmacia diferente, informe a nuestra oficina a travs de MyChart de  o por telfono llamando al [phone] y presione la opcin 4.

## 2023-09-29 ENCOUNTER — Ambulatory Visit
Admission: RE | Admit: 2023-09-29 | Discharge: 2023-09-29 | Disposition: A | Discharge status: Home / Self Care | Source: Ambulatory Visit | Attending: Neurosurgery | Admitting: Neurosurgery

## 2023-09-29 DIAGNOSIS — M546 Pain in thoracic spine: Secondary | ICD-10-CM | POA: Insufficient documentation

## 2023-09-29 DIAGNOSIS — M549 Dorsalgia, unspecified: Secondary | ICD-10-CM | POA: Diagnosis not present

## 2023-09-29 DIAGNOSIS — M4804 Spinal stenosis, thoracic region: Secondary | ICD-10-CM | POA: Diagnosis not present

## 2023-09-29 DIAGNOSIS — M5134 Other intervertebral disc degeneration, thoracic region: Secondary | ICD-10-CM | POA: Diagnosis not present

## 2023-10-02 ENCOUNTER — Other Ambulatory Visit: Payer: Self-pay | Admitting: Family Medicine

## 2023-10-02 DIAGNOSIS — M791 Myalgia, unspecified site: Secondary | ICD-10-CM | POA: Diagnosis not present

## 2023-10-02 DIAGNOSIS — M542 Cervicalgia: Secondary | ICD-10-CM | POA: Diagnosis not present

## 2023-10-02 DIAGNOSIS — G43719 Chronic migraine without aura, intractable, without status migrainosus: Secondary | ICD-10-CM | POA: Diagnosis not present

## 2023-10-02 DIAGNOSIS — G518 Other disorders of facial nerve: Secondary | ICD-10-CM | POA: Diagnosis not present

## 2023-10-02 NOTE — Telephone Encounter (Signed)
 Last office visit 06/30/2023 for elevated blood sugar.  Last refilled 08/09/2023 for #120 with 1 refill.  Next Appt: CPE 04/16/2024.

## 2023-10-05 DIAGNOSIS — M546 Pain in thoracic spine: Secondary | ICD-10-CM | POA: Diagnosis not present

## 2023-10-10 ENCOUNTER — Encounter: Payer: Self-pay | Admitting: Family Medicine

## 2023-10-10 ENCOUNTER — Telehealth: Payer: Self-pay | Admitting: Cardiovascular Disease

## 2023-10-10 ENCOUNTER — Ambulatory Visit (INDEPENDENT_AMBULATORY_CARE_PROVIDER_SITE_OTHER): Admitting: Family Medicine

## 2023-10-10 VITALS — BP 100/50 | HR 71 | Temp 97.3°F | Ht 72.0 in | Wt 126.5 lb

## 2023-10-10 DIAGNOSIS — R9389 Abnormal findings on diagnostic imaging of other specified body structures: Secondary | ICD-10-CM

## 2023-10-10 DIAGNOSIS — M816 Localized osteoporosis [Lequesne]: Secondary | ICD-10-CM | POA: Diagnosis not present

## 2023-10-10 DIAGNOSIS — F5104 Psychophysiologic insomnia: Secondary | ICD-10-CM | POA: Diagnosis not present

## 2023-10-10 DIAGNOSIS — S22080D Wedge compression fracture of T11-T12 vertebra, subsequent encounter for fracture with routine healing: Secondary | ICD-10-CM

## 2023-10-10 DIAGNOSIS — R1310 Dysphagia, unspecified: Secondary | ICD-10-CM | POA: Diagnosis not present

## 2023-10-10 NOTE — Telephone Encounter (Signed)
   Patient Name: Shawn Lam  DOB: 1951/04/04 MRN: [id number]  Primary Cardiologist: Evalene Lunger, MD  Chart reviewed as part of pre-operative protocol coverage.   Patient can hold Plavix  x 7 days prior to the procedure and resume when medically safe to do so. Please continue ASA 81 mg thru perioperative period.   Wyn Raddle, Jackee Shove, NP 10/10/2023, 12:17 PM

## 2023-10-10 NOTE — Assessment & Plan Note (Signed)
 Chronic, well-controlled but now that on Zonegran for migraines has helped significantly I have encouraged him to decrease or stop Ambien  to avoid oversedation and increased risk of falls.

## 2023-10-10 NOTE — Assessment & Plan Note (Signed)
 Associated with mild to moderate central spinal canal stenosis.  Patient has upcoming epidural steroid injections. Per wife Dr. Onetha plans on repeating CT thoracic spine.  She will call to determine if he will be able to use CT chest for this to avoid to CT scans.

## 2023-10-10 NOTE — Patient Instructions (Addendum)
 Given on Zonegran now.. decrease or stop Ambien  at night for sleep.

## 2023-10-10 NOTE — Assessment & Plan Note (Signed)
 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  Now patient with changes on thoracic MRI described as patchy streaky peripheral opacities in upper lungs bilaterally. Patient minimally symptomatic with occasional mild shortness of breath with or without exertion. Will repeat chest CT dedicated to look at lung.  May need referral to pulmonary for further recommendations.  Return and ER precautions provided.

## 2023-10-10 NOTE — Telephone Encounter (Signed)
   Pre-operative Risk Assessment    Patient Name: Shawn Lam  DOB: [birth year] MRN: [id number]   Date of last office visit: 09/20/23 Date of next office visit: 12/25/23   Request for Surgical Clearance    Procedure:  Thoracis  Date of Surgery:  Clearance TBD                                Surgeon:  Dr. Deatrice Manus Surgeon's Group or Practice Name:  [facility] Phone number:  [phone] Fax number:  [phone]   Type of Clearance Requested:   - Pharmacy:  Hold Clopidogrel  (Plavix ) 7 days prior   Type of Anesthesia:  Not Indicated   Additional requests/questions:    Signed, Gwendolyn H Slade   10/10/2023, 12:01 PM

## 2023-10-10 NOTE — Assessment & Plan Note (Signed)
 Chronic, ever since neck surgery in December 2020 for.  Refer to speech therapy for barium swallow.

## 2023-10-10 NOTE — Progress Notes (Addendum)
 Patient ID: Shawn Lam, male    DOB: 15-Jun-1950, 73 y.o.   MRN: [id number]  This visit was conducted in person.  BP (!) 100/50   Pulse 71   Temp (!) 97.3 F (36.3 C) (Temporal)   Ht 6' (1.829 m)   Wt 126 lb 8 oz (57.4 kg)   SpO2 99%   BMI 17.16 kg/m    CC:  Chief Complaint  Patient presents with   Results    Discuss MRi results    Subjective:   HPI: Shawn Lam is a 73 y.o. male presenting on 10/10/2023 for Results (Discuss MRi results)  Patient presents today to discuss his recent MRI done by Dr. Onetha for thoracic back pain. MRI showed chronic compression deformities of T7, T8, T9 and T12 with mild to moderate central spinal canal stenosis.  No interval change.  Planning ESI. Also noted incidentally patchy streaky peripheral opacities present in lungs, upper bilaterally  He is currently on Prolia  for osteoporosis  04/2022 DEXA  2021 CXR no chronic changes Nonsmoker but childhood secondhand  smoker 2022 Chest CT IMPRESSION: 1. 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 2. Tiny right renal stone. 3. Aortic atherosclerosis (ICD10-I70.0). Coronary artery calcification.  Has ST in last few days  and runny nose for several month. Occ SOB with exertion or rest.. not every day.  Ever since neck surgery in 03/2023 he has noted food, liquids and pill getting stuck on right throat. Choking and coughing to dislodged things. He has lost a significant amount of weight due to decreased eating given swallowing problems Wt Readings from Last 3 Encounters:  10/10/23 126 lb 8 oz (57.4 kg)  09/20/23 128 lb 8 oz (58.3 kg)  08/28/23 140 lb (63.5 kg)    Blood pressure is low today and given weight loss patient may need to decrease blood pressure medications.  He will call Dr. Tresia office to request their recommendations.  Relevant past  medical, surgical, family and social history reviewed and updated as indicated. Interim medical history since our last visit reviewed. Allergies and medications reviewed and updated. Outpatient Medications Prior to Visit  Medication Sig Dispense Refill   aspirin  EC 81 MG tablet Take 81 mg by mouth daily.     atorvastatin  (LIPITOR ) 80 MG tablet TAKE 1 TABLET BY MOUTH DAILY 90 tablet 3   baclofen (LIORESAL) 10 MG tablet Take 10 mg by mouth 2 (two) times daily.     buPROPion  (WELLBUTRIN  XL) 150 MG 24 hr tablet Take 3 tablets (450 mg total) by mouth daily. 90 tablet 1   busPIRone  (BUSPAR ) 10 MG tablet Take 2 tablets (20 mg total) by mouth 3 (three) times daily. 180 tablet 5   Cholecalciferol  (VITAMIN D3) 50 MCG (2000 UT) TABS Take 2,000 Units by mouth daily.     clopidogrel  (PLAVIX ) 75 MG tablet TAKE 1 TABLET BY MOUTH DAILY 90 tablet 3   cyanocobalamin  (VITAMIN B12) 1000 MCG tablet Take 1,000 mcg by mouth daily.     denosumab  (PROLIA ) 60 MG/ML SOSY injection Inject 60 mg into the skin every 6 (six) months. Do not send RX, order this from the office     DULoxetine  (CYMBALTA ) 30 MG capsule TAKE THREE CAPSULES BY MOUTH DAILY 90 capsule 5   Erenumab-aooe (AIMOVIG) 140 MG/ML SOAJ 140 mg every 28 (twenty-eight) days.     finasteride  (PROSCAR ) 5 MG tablet Take 5 mg by  mouth daily.     gabapentin  (NEURONTIN ) 100 MG capsule TAKE 1 CAPSULE BY MOUTH 4 TIMES A DAY 120 capsule 1   levothyroxine  (SYNTHROID ) 88 MCG tablet TAKE 1 TABLET BY MOUTH DAILY BEFORE BREAKFAST 90 tablet 3   losartan  (COZAAR ) 50 MG tablet Take 1 tablet (50 mg total) by mouth in the morning and at bedtime. 180 tablet 3   Magnesium  250 MG CAPS Take 250 mg by mouth daily.     metoprolol  succinate (TOPROL  XL) 25 MG 24 hr tablet Take 1 tablet (25 mg total) by mouth daily. 90 tablet 3   nitroGLYCERIN  (NITROSTAT ) 0.4 MG SL tablet Place 1 tablet (0.4 mg total) under the tongue every 5 (five) minutes as needed for chest pain. 10 tablet 1   primidone   (MYSOLINE ) 50 MG tablet Take 50 mg by mouth 2 (two) times daily.     triamcinolone  cream (KENALOG ) 0.1 % APPLY TOPICALLY 2 TIMES A DAY AS NEEDED .DO NOT APPLY TO FACE, GROIN AND UNDERARMS 454 g 0   zolpidem  (AMBIEN ) 10 MG tablet TAKE 1 TABLET BY MOUTH EVERY NIGHT AT BEDTIME AS NEEDED 30 tablet 1   zonisamide (ZONEGRAN) 25 MG capsule Take 25 mg by mouth 4 (four) times daily.     ferrous sulfate  325 (65 FE) MG tablet Take 325 mg by mouth daily with breakfast.     Multiple Vitamins-Minerals (MULTIVITAMIN WITH MINERALS) tablet Take 1 tablet by mouth daily.     Facility-Administered Medications Prior to Visit  Medication Dose Route Frequency Provider Last Rate Last Admin   denosumab  (PROLIA ) injection 60 mg  60 mg Subcutaneous Once Salimata Christenson E, MD       [START ON 12/28/2023] denosumab  (PROLIA ) injection 60 mg  60 mg Subcutaneous Once Dayveon Halley E, MD         Per HPI unless specifically indicated in ROS section below Review of Systems  Constitutional:  Negative for fatigue and fever.  HENT:  Negative for ear pain.   Eyes:  Negative for pain.  Respiratory:  Negative for cough and shortness of breath.   Cardiovascular:  Negative for chest pain, palpitations and leg swelling.  Gastrointestinal:  Negative for abdominal pain.  Genitourinary:  Negative for dysuria.  Musculoskeletal:  Negative for arthralgias.  Neurological:  Negative for syncope, light-headedness and headaches.  Psychiatric/Behavioral:  Negative for dysphoric mood.    Objective:  BP (!) 100/50   Pulse 71   Temp (!) 97.3 F (36.3 C) (Temporal)   Ht 6' (1.829 m)   Wt 126 lb 8 oz (57.4 kg)   SpO2 99%   BMI 17.16 kg/m   Wt Readings from Last 3 Encounters:  10/10/23 126 lb 8 oz (57.4 kg)  09/20/23 128 lb 8 oz (58.3 kg)  08/28/23 140 lb (63.5 kg)      Physical Exam Constitutional:      Appearance: He is well-developed.  HENT:     Head: Normocephalic.     Right Ear: Hearing normal.     Left Ear: Hearing normal.      Nose: Nose normal.  Neck:     Thyroid : No thyroid  mass or thyromegaly.     Vascular: No carotid bruit.     Trachea: Trachea normal.   Cardiovascular:     Rate and Rhythm: Normal rate and regular rhythm.     Pulses: Normal pulses.     Heart sounds: Heart sounds not distant. No murmur heard.    No friction rub. No gallop.  Comments: No peripheral edema Pulmonary:     Effort: Pulmonary effort is normal. No respiratory distress.     Breath sounds: Normal breath sounds.   Skin:    General: Skin is warm and dry.     Findings: No rash.   Psychiatric:        Speech: Speech normal.        Behavior: Behavior normal.        Thought Content: Thought content normal.       Results for orders placed or performed in visit on 08/15/23  CBC with Differential/Platelet   Collection Time: 08/15/23  8:15 AM  Result Value Ref Range   WBC 5.5 4.0 - 10.5 K/uL   RBC 3.72 (L) 4.22 - 5.81 Mil/uL   Hemoglobin 12.4 (L) 13.0 - 17.0 g/dL   HCT 62.0 (L) 60.9 - 47.9 %   MCV 102.1 (H) 78.0 - 100.0 fl   MCHC 32.8 30.0 - 36.0 g/dL   RDW 85.0 88.4 - 84.4 %   Platelets 235.0 150.0 - 400.0 K/uL   Neutrophils Relative % 66.5 43.0 - 77.0 %   Lymphocytes Relative 24.0 12.0 - 46.0 %   Monocytes Relative 5.3 3.0 - 12.0 %   Eosinophils Relative 3.2 0.0 - 5.0 %   Basophils Relative 1.0 0.0 - 3.0 %   Neutro Abs 3.7 1.4 - 7.7 K/uL   Lymphs Abs 1.3 0.7 - 4.0 K/uL   Monocytes Absolute 0.3 0.1 - 1.0 K/uL   Eosinophils Absolute 0.2 0.0 - 0.7 K/uL   Basophils Absolute 0.1 0.0 - 0.1 K/uL  Iron, TIBC and Ferritin Panel   Collection Time: 08/15/23  8:15 AM  Result Value Ref Range   Iron 70 50 - 180 mcg/dL   TIBC 777 (L) 749 - 574 mcg/dL (calc)   %SAT 32 20 - 48 % (calc)   Ferritin 337 24 - 380 ng/mL  Hepatitis panel, acute   Collection Time: 08/15/23  8:15 AM  Result Value Ref Range   Hep A IgM NON-REACTIVE NON-REACTIVE   Hepatitis B Surface Ag NON-REACTIVE NON-REACTIVE   Hep B C IgM NON-REACTIVE NON-REACTIVE    Hepatitis C Ab NON-REACTIVE NON-REACTIVE  Hepatic function panel   Collection Time: 08/15/23  8:15 AM  Result Value Ref Range   Total Bilirubin 0.6 0.2 - 1.2 mg/dL   Bilirubin, Direct 0.1 0.0 - 0.3 mg/dL   Alkaline Phosphatase 55 39 - 117 U/L   AST 47 (H) 0 - 37 U/L   ALT 43 0 - 53 U/L   Total Protein 5.9 (L) 6.0 - 8.3 g/dL   Albumin 3.9 3.5 - 5.2 g/dL   *Note: Due to a large number of results and/or encounters for the requested time period, some results have not been displayed. A complete set of results can be found in Results Review.    Assessment and Plan  Compression fracture of T12 vertebra with routine healing, subsequent encounter Assessment & Plan: Associated with mild to moderate central spinal canal stenosis.  Patient has upcoming epidural steroid injections. Per wife Dr. Onetha plans on repeating CT thoracic spine.  She will call to determine if he will be able to use CT chest for this to avoid to CT scans.   Localized osteoporosis without current pathological fracture Assessment & Plan: Chronic, on Prolia . 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  Now patient with  changes on thoracic MRI described as patchy streaky peripheral opacities in upper lungs bilaterally. Patient minimally symptomatic with occasional mild shortness of breath with or without exertion. Will repeat chest CT dedicated to look at lung.  May need referral to pulmonary for further recommendations.  Return and ER precautions provided.  Orders: -     CT CHEST WO CONTRAST; Future  Dysphagia, unspecified type Assessment & Plan: Chronic, ever since neck surgery in December 2020 for.  Refer to speech therapy for barium swallow.  Orders: -     SLP modified barium swallow; Future  Chronic insomnia Assessment & Plan: Chronic, well-controlled but now that on Zonegran for migraines has  helped significantly I have encouraged him to decrease or stop Ambien  to avoid oversedation and increased risk of falls.     No follow-ups on file.   Greig Ring, MD

## 2023-10-10 NOTE — Assessment & Plan Note (Signed)
 Chronic, on Prolia . DEXA due January 2026

## 2023-10-11 ENCOUNTER — Other Ambulatory Visit: Payer: Self-pay | Admitting: Family Medicine

## 2023-10-18 ENCOUNTER — Other Ambulatory Visit: Payer: Self-pay | Admitting: Family Medicine

## 2023-10-18 NOTE — Telephone Encounter (Signed)
 Last office visit 10/10/2023 for MRI results.  Last refilled 08/21/2023 for #30 with 1 refills.  Next appt: CPE 04/16/2024.

## 2023-10-19 DIAGNOSIS — G43719 Chronic migraine without aura, intractable, without status migrainosus: Secondary | ICD-10-CM | POA: Diagnosis not present

## 2023-10-19 DIAGNOSIS — M791 Myalgia, unspecified site: Secondary | ICD-10-CM | POA: Diagnosis not present

## 2023-10-19 DIAGNOSIS — M542 Cervicalgia: Secondary | ICD-10-CM | POA: Diagnosis not present

## 2023-10-19 DIAGNOSIS — G518 Other disorders of facial nerve: Secondary | ICD-10-CM | POA: Diagnosis not present

## 2023-11-06 ENCOUNTER — Other Ambulatory Visit: Payer: Self-pay | Admitting: Family Medicine

## 2023-11-07 ENCOUNTER — Ambulatory Visit

## 2023-11-07 DIAGNOSIS — M5414 Radiculopathy, thoracic region: Secondary | ICD-10-CM | POA: Diagnosis not present

## 2023-11-08 ENCOUNTER — Ambulatory Visit
Admission: RE | Admit: 2023-11-08 | Discharge: 2023-11-08 | Disposition: A | Discharge status: Home / Self Care | Source: Ambulatory Visit | Attending: Family Medicine | Admitting: Family Medicine

## 2023-11-08 DIAGNOSIS — R9389 Abnormal findings on diagnostic imaging of other specified body structures: Secondary | ICD-10-CM | POA: Insufficient documentation

## 2023-11-08 DIAGNOSIS — I700 Atherosclerosis of aorta: Secondary | ICD-10-CM | POA: Diagnosis not present

## 2023-11-08 DIAGNOSIS — R918 Other nonspecific abnormal finding of lung field: Secondary | ICD-10-CM | POA: Diagnosis not present

## 2023-11-08 DIAGNOSIS — J479 Bronchiectasis, uncomplicated: Secondary | ICD-10-CM | POA: Diagnosis not present

## 2023-11-08 DIAGNOSIS — J439 Emphysema, unspecified: Secondary | ICD-10-CM | POA: Diagnosis not present

## 2023-11-09 DIAGNOSIS — G518 Other disorders of facial nerve: Secondary | ICD-10-CM | POA: Diagnosis not present

## 2023-11-09 DIAGNOSIS — Z79899 Other long term (current) drug therapy: Secondary | ICD-10-CM | POA: Diagnosis not present

## 2023-11-09 DIAGNOSIS — M542 Cervicalgia: Secondary | ICD-10-CM | POA: Diagnosis not present

## 2023-11-09 DIAGNOSIS — G43719 Chronic migraine without aura, intractable, without status migrainosus: Secondary | ICD-10-CM | POA: Diagnosis not present

## 2023-11-09 DIAGNOSIS — M791 Myalgia, unspecified site: Secondary | ICD-10-CM | POA: Diagnosis not present

## 2023-11-13 ENCOUNTER — Other Ambulatory Visit: Payer: Self-pay | Admitting: Family Medicine

## 2023-11-13 ENCOUNTER — Ambulatory Visit (INDEPENDENT_AMBULATORY_CARE_PROVIDER_SITE_OTHER): Payer: Medicare Other

## 2023-11-13 ENCOUNTER — Ambulatory Visit (INDEPENDENT_AMBULATORY_CARE_PROVIDER_SITE_OTHER): Payer: 59 | Admitting: Vascular Surgery

## 2023-11-13 ENCOUNTER — Encounter (INDEPENDENT_AMBULATORY_CARE_PROVIDER_SITE_OTHER): Payer: Self-pay | Admitting: Vascular Surgery

## 2023-11-13 VITALS — BP 120/82 | HR 67 | Resp 18 | Ht 72.0 in | Wt 128.0 lb

## 2023-11-13 DIAGNOSIS — K219 Gastro-esophageal reflux disease without esophagitis: Secondary | ICD-10-CM | POA: Diagnosis not present

## 2023-11-13 DIAGNOSIS — I6521 Occlusion and stenosis of right carotid artery: Secondary | ICD-10-CM

## 2023-11-13 DIAGNOSIS — E782 Mixed hyperlipidemia: Secondary | ICD-10-CM

## 2023-11-13 DIAGNOSIS — I25118 Atherosclerotic heart disease of native coronary artery with other forms of angina pectoris: Secondary | ICD-10-CM

## 2023-11-13 NOTE — Progress Notes (Signed)
 MRN : [id number]  Shawn Lam is a 73 y.o. (01/05/1951) male who presents with chief complaint of check carotid arteries.  History of Present Illness:   The patient is seen for follow up evaluation of carotid stenosis. The carotid stenosis followed by ultrasound.   The patient denies amaurosis fugax. There is no recent history of TIA symptoms or focal motor deficits. There is no prior documented CVA.  The patient is taking enteric-coated aspirin  81 mg daily.  There is no history of migraine headaches. There is no history of seizures.  No recent shortening of the patient's walking distance or new symptoms consistent with claudication.  No history of rest pain symptoms. No new ulcers or wounds of the lower extremities have occurred.  There is no history of DVT, PE or superficial thrombophlebitis. No documented recent episodes of angina or shortness of breath documented.   CT dated 03/21/2023 is reviewed by me and shows 40% stenosis in the distal right bulb and proximal right internal carotid artery.  There is less than 10% stenosis noted on the left side.  Intracranial portion of the CT angiogram is notable for high-grade atheromatous narrowing at the upper branch left PCA.  No other hemodynamically significant stenoses are noted intracranially.  Carotid Duplex done today shows RICA 1-39% and LICA 1-39%.     No outpatient medications have been marked as taking for the 11/13/23 encounter (Appointment) with Jama, Cordella MATSU, MD.   Current Facility-Administered Medications for the 11/13/23 encounter (Appointment) with Jama, Cordella MATSU, MD  Medication   denosumab  (PROLIA ) injection 60 mg   [START ON 12/28/2023] denosumab  (PROLIA ) injection 60 mg    Past Medical History:  Diagnosis Date   Actinic keratosis 02/04/2021   right lateral neck   Anemia    Anxiety    Asymptomatic LV dysfunction    50-55% by echo 06/2016   BCC (basal cell carcinoma of skin)  08/05/2020   left neck infra auricular - nodular pattern - [facility] with [facility] 09/17/2020   Berger's disease    CAD (coronary artery disease) 06/2005   PCI of LAD and RCA   Depression    Dilated aortic root (HCC)    4cm at sinus of Valsalva   Hematuria    Hyperlipidemia    Hypothyroidism    Insomnia    Left ureteral stone    Orthostatic hypotension    negative tilt table   OSA (obstructive sleep apnea)    moderate with AHI 17/hr, uses CPAP nightly(01/15/19 - has Inspire implant)   PVC's (premature ventricular contractions) 07/07/2016   Renal disorder    PT IS SEEING DR. BETSEY- NEPHROLOGIST   Rosacea    Urticaria     Past Surgical History:  Procedure Laterality Date   ANTERIOR CERVICAL DECOMP/DISCECTOMY FUSION N/A 03/13/2023   Procedure: CERVICAL FOUR-FIVE, CERVICAL FIVE-SIX ANTERIOR CERVICAL DISCECTOMY/DECOMPRESSION FUSION;  Surgeon: Onetha Kuba, MD;  Location: [facility] OR;  Service: Neurosurgery;  Laterality: N/A;  3C   CARDIAC CATHETERIZATION  09/06/2013   [facility]   CARDIAC CATHETERIZATION     COLONOSCOPY WITH PROPOFOL  N/A 01/21/2019   Procedure: COLONOSCOPY WITH PROPOFOL ;  Surgeon: Jinny Carmine, MD;  Location: [facility];  Service: Endoscopy;  Laterality: N/A;  sleep apnea   COLONOSCOPY WITH PROPOFOL  N/A 10/19/2021   Procedure: COLONOSCOPY WITH PROPOFOL ;  Surgeon: Jinny Carmine, MD;  Location: [facility] ENDOSCOPY;  Service: Endoscopy;  Laterality: N/A;   CORONARY ANGIOPLASTY  2004   STENT PLACEMENT   CYSTOSCOPY WITH RETROGRADE PYELOGRAM, URETEROSCOPY AND STENT PLACEMENT Left 03/19/2014   Procedure: CYSTOSCOPY WITH RETROGRADE PYELOGRAM, URETEROSCOPY AND STENT PLACEMENT;  Surgeon: Mickey Ricardo KATHEE Alvaro, MD;  Location: [facility] ORS;  Service: Urology;  Laterality: Left;   CYSTOSCOPY WITH RETROGRADE PYELOGRAM, URETEROSCOPY AND STENT PLACEMENT Bilateral 05/20/2015   Procedure:  CYSTOSCOPY WITH BILATERAL RETROGRADE PYELOGRAM,RIGHT  DIAGNOSTIC URETEROSCOPY ,LEFT URETEROSCOPY WITH HOLMIUM LASER  AND  BILATERAL  STENT PLACEMENT ;  Surgeon: Ricardo Alvaro, MD;  Location: [facility];  Service: Urology;  Laterality: Bilateral;   DRUG INDUCED ENDOSCOPY N/A 10/20/2017   Procedure: DRUG INDUCED SLEEP ENDOSCOPY;  Surgeon: Carlie Clark, MD;  Location: [facility];  Service: ENT;  Laterality: N/A;   EP IMPLANTABLE DEVICE N/A 05/13/2016   Procedure: Loop Recorder Insertion;  Surgeon: Elspeth JAYSON Sage, MD;  Location: [facility] LAB;  Service: Cardiovascular;  Laterality: N/A;   ESOPHAGOGASTRODUODENOSCOPY N/A 10/19/2021   Procedure: ESOPHAGOGASTRODUODENOSCOPY (EGD);  Surgeon: Jinny Carmine, MD;  Location: [facility] END[facility]COPY;  Service: Endoscopy;  Laterality: N/A;   ESOPHAGOGASTRODUODENOSCOPY (EGD) WITH PROPOFOL  N/A 11/30/2018   Procedure: ESOPHAGOGASTRODUODENOSCOPY (EGD) WITH PROPOFOL ;  Surgeon: Therisa Bi, MD;  Location: [facility] END[facility]COPY;  Service: Gastroenterology;  Laterality: N/A;   ESOPHAGOGASTRODUODENOSCOPY (EGD) WITH PROPOFOL  N/A 12/01/2018   Procedure: ESOPHAGOGASTRODUODENOSCOPY (EGD) WITH PROPOFOL ;  Surgeon: Jinny Carmine, MD;  Location: [facility] ENDOSCOPY;  Service: Endoscopy;  Laterality: N/A;   HOLMIUM LASER APPLICATION Bilateral 05/20/2015   Procedure: HOLMIUM LASER APPLICATION;  Surgeon: Ricardo Alvaro, MD;  Location: [facility];  Service: Urology;  Laterality: Bilateral;   IMPLIMENTATION OF A HYPOGLOSSAL NERVE STIMULATOR  12/08/2017   OSA    LEFT HEART CATHETERIZATION WITH CORONARY ANGIOGRAM N/A 09/06/2013   Procedure: LEFT HEART CATHETERIZATION WITH CORONARY ANGIOGRAM;  Surgeon: Victory LELON Claudene DOUGLAS, MD;  Location: [facility] CATH LAB;  Service: Cardiovascular;  Laterality: N/A;   LEFT KNEE CAP  SURGERY ABOUT 40 YRS AGO  ? 1970     STONE EXTRACTION WITH BASKET Left 03/19/2014   Procedure: STONE EXTRACTION WITH BASKET;  Surgeon: Mickey Ricardo KATHEE Alvaro, MD;  Location: [facility];  Service: Urology;  Laterality: Left;   TRANSURETHRAL RESECTION OF PROSTATE N/A 08/25/2021   Procedure: TRANSURETHRAL RESECTION OF THE PROSTATE  (TURP);  Surgeon: Alvaro Ricardo, MD;  Location: [facility];  Service: Urology;  Laterality: N/A;  1 HR    Social History Social History   Tobacco Use   Smoking status: Never   Smokeless tobacco: Never  Vaping Use   Vaping status: Never Used  Substance Use Topics   Alcohol use: Not Currently    Alcohol/week: 1.0 standard drink of alcohol    Types: 1 Cans of beer per week   Drug use: No    Family History Family History  Problem Relation Age of Onset   Coronary artery disease Mother    Heart attack Mother    Heart disease Mother    Hypertension Mother    Cancer Father        lung and colon   Lung cancer Father        smoker   Diabetes Brother     Allergies  Allergen Reactions   Codeine Nausea Only and Nausea And Vomiting   Tape Other (See Comments)    Use only paper tape. Severe blistering and bruising with other tapes. No cloth tape.     REVIEW OF SYSTEMS (Negative unless checked)  Constitutional: [] Weight loss  [] Fever  [] Chills Cardiac: [] Chest pain   [] Chest pressure   [] Palpitations   [] Shortness of breath when laying flat   [] Shortness of breath with exertion. Vascular:  [x] Pain in legs with walking   [] Pain in legs at rest  [] History of DVT   [] Phlebitis   [] Swelling in legs   [] Varicose veins   [] Non-healing ulcers Pulmonary:   [] Uses home oxygen    [] Productive cough   [] Hemoptysis   [] Wheeze  [] COPD   [] Asthma Neurologic:  [] Dizziness   [] Seizures   [] History of stroke   [] History of TIA  [] Aphasia   [] Vissual changes   [x] Weakness or numbness in arm   [] Weakness or numbness in leg Musculoskeletal:   [] Joint swelling   [x] Joint pain   [x] Low back pain Hematologic:  [] Easy bruising  [] Easy bleeding   [] Hypercoagulable state   [] Anemic Gastrointestinal:  [] Diarrhea   [] Vomiting  [x] Gastroesophageal reflux/heartburn   [] Difficulty swallowing. Genitourinary:  [] Chronic kidney disease   [] Difficult urination  [] Frequent urination   [] Blood in urine Skin:  [] Rashes    [] Ulcers  Psychological:  [] History of anxiety   []  History of major depression.  Physical Examination  There were no vitals filed for this visit. There is no height or weight on file to calculate BMI. Gen: WD/WN, NAD Head: [place]/AT, No temporalis wasting.  Ear/Nose/Throat: Hearing grossly intact, nares w/o erythema or drainage Eyes: PER, EOMI, sclera nonicteric.  Neck: Supple, no masses.  No bruit or JVD.  Pulmonary:  Good air movement, no audible wheezing, no use of accessory muscles.  Cardiac: RRR, normal S1, S2, no Murmurs. Vascular:  carotid bruit noted Vessel Right Left  Radial Palpable Palpable  Carotid  Palpable  Palpable  Subclav  Palpable Palpable  Gastrointestinal: soft, non-distended. No guarding/no peritoneal signs.  Musculoskeletal: M/S 5/5 throughout.  No visible deformity.  Neurologic: CN 2-12 intact. Pain and light touch intact in extremities.  Symmetrical.  Speech is fluent. Motor exam as listed above. Psychiatric: Judgment intact, Mood & affect appropriate for pt's clinical situation. Dermatologic: No rashes or ulcers noted.  No changes consistent with cellulitis.   CBC Lab Results  Component Value Date   WBC 5.5 08/15/2023   HGB 12.4 (L) 08/15/2023   HCT 37.9 (L) 08/15/2023   MCV 102.1 (H) 08/15/2023   PLT 235.0 08/15/2023    BMET    Component Value Date/Time   NA 139 07/12/2023 0804   NA 145 (H) 06/27/2023 1425   K 4.4 07/12/2023 0804   CL 101 07/12/2023 0804   CO2 29 07/12/2023 0804   GLUCOSE 90 07/12/2023 0804   GLUCOSE 109 06/22/2009 1228   BUN 18 07/12/2023 0804   BUN 13 06/27/2023 1425   CREATININE 1.15 07/12/2023 0804   CALCIUM  8.7 07/12/2023 0804   GFRNONAA >60 03/21/2023 0740   GFRAA 60 (L) 01/08/2020 0513   CrCl cannot be calculated (Patient's most recent lab result is older than the maximum 21 days allowed.).  COAG Lab Results  Component Value Date   INR 1.0 07/04/2022   INR 0.9 02/07/2020   INR 0.9 09/04/2013    Radiology No  results found.   Assessment/Plan 1. Stenosis of right carotid artery (Primary) Recommend:  Given the patient's asymptomatic subcritical stenosis no further invasive testing or surgery at this time.  Duplex ultrasound shows 1-39% stenosis bilaterally.  Continue antiplatelet therapy as prescribed Continue management of CAD, HTN and Hyperlipidemia Healthy heart diet,  encouraged exercise at least 4 times per  week  Follow up in 12 months with duplex ultrasound and physical exam  - VAS US  CAROTID; Future  2. Coronary artery disease of native artery of native heart with stable angina pectoris (HCC) Continue cardiac and antihypertensive medications as already ordered and reviewed, no changes at this time.  Continue statin as ordered and reviewed, no changes at this time  Nitrates PRN for chest pain  3. Gastroesophageal reflux disease, unspecified whether esophagitis present Continue PPI as already ordered, this medication has been reviewed and there are no changes at this time.  Avoidence of caffeine and alcohol  Moderate elevation of the head of the bed   4. Mixed hyperlipidemia Continue statin as ordered and reviewed, no changes at this time    Cordella Shawl, MD  11/13/2023 11:48 AM

## 2023-11-16 ENCOUNTER — Other Ambulatory Visit: Payer: Self-pay | Admitting: Family Medicine

## 2023-11-16 NOTE — Telephone Encounter (Signed)
 Last office visit Discuss MRI results.  Last refilled 10/18/23 for #30 with no refills.  Next appt: CPE 04/16/24.

## 2023-11-17 ENCOUNTER — Ambulatory Visit: Payer: Self-pay | Admitting: Family Medicine

## 2023-11-17 DIAGNOSIS — R918 Other nonspecific abnormal finding of lung field: Secondary | ICD-10-CM

## 2023-11-18 ENCOUNTER — Encounter (INDEPENDENT_AMBULATORY_CARE_PROVIDER_SITE_OTHER): Payer: Self-pay | Admitting: Vascular Surgery

## 2023-11-21 DIAGNOSIS — Z961 Presence of intraocular lens: Secondary | ICD-10-CM | POA: Diagnosis not present

## 2023-11-21 DIAGNOSIS — H40013 Open angle with borderline findings, low risk, bilateral: Secondary | ICD-10-CM | POA: Diagnosis not present

## 2023-11-21 DIAGNOSIS — I10 Essential (primary) hypertension: Secondary | ICD-10-CM | POA: Diagnosis not present

## 2023-11-21 DIAGNOSIS — Z1331 Encounter for screening for depression: Secondary | ICD-10-CM | POA: Diagnosis not present

## 2023-11-21 DIAGNOSIS — E538 Deficiency of other specified B group vitamins: Secondary | ICD-10-CM | POA: Diagnosis not present

## 2023-11-21 DIAGNOSIS — H52223 Regular astigmatism, bilateral: Secondary | ICD-10-CM | POA: Diagnosis not present

## 2023-11-21 DIAGNOSIS — D3142 Benign neoplasm of left ciliary body: Secondary | ICD-10-CM | POA: Diagnosis not present

## 2023-11-21 DIAGNOSIS — M5481 Occipital neuralgia: Secondary | ICD-10-CM | POA: Diagnosis not present

## 2023-11-21 DIAGNOSIS — G43719 Chronic migraine without aura, intractable, without status migrainosus: Secondary | ICD-10-CM | POA: Diagnosis not present

## 2023-11-21 DIAGNOSIS — H43812 Vitreous degeneration, left eye: Secondary | ICD-10-CM | POA: Diagnosis not present

## 2023-11-21 DIAGNOSIS — H524 Presbyopia: Secondary | ICD-10-CM | POA: Diagnosis not present

## 2023-11-21 DIAGNOSIS — M7918 Myalgia, other site: Secondary | ICD-10-CM | POA: Diagnosis not present

## 2023-11-21 DIAGNOSIS — H5203 Hypermetropia, bilateral: Secondary | ICD-10-CM | POA: Diagnosis not present

## 2023-11-21 DIAGNOSIS — H47013 Ischemic optic neuropathy, bilateral: Secondary | ICD-10-CM | POA: Diagnosis not present

## 2023-11-21 DIAGNOSIS — I6521 Occlusion and stenosis of right carotid artery: Secondary | ICD-10-CM | POA: Diagnosis not present

## 2023-11-22 DIAGNOSIS — G43719 Chronic migraine without aura, intractable, without status migrainosus: Secondary | ICD-10-CM | POA: Diagnosis not present

## 2023-11-23 DIAGNOSIS — G43719 Chronic migraine without aura, intractable, without status migrainosus: Secondary | ICD-10-CM | POA: Diagnosis not present

## 2023-11-23 DIAGNOSIS — G518 Other disorders of facial nerve: Secondary | ICD-10-CM | POA: Diagnosis not present

## 2023-11-23 DIAGNOSIS — M542 Cervicalgia: Secondary | ICD-10-CM | POA: Diagnosis not present

## 2023-11-23 DIAGNOSIS — M791 Myalgia, unspecified site: Secondary | ICD-10-CM | POA: Diagnosis not present

## 2023-11-27 ENCOUNTER — Telehealth: Payer: Self-pay

## 2023-11-27 NOTE — Telephone Encounter (Signed)
 Prolia  VOB initiated via [email or web address]  Next Prolia  inj DUE: 12/28/23

## 2023-11-27 NOTE — Telephone Encounter (Signed)
 Shawn Lam

## 2023-11-27 NOTE — Telephone Encounter (Signed)
 Pt ready for scheduling for PROLIA  on or after : 12/28/23  Option# 1: Buy/Bill (Office supplied medication)  Out-of-pocket cost due at time of clinic visit: $0  Number of injection/visits approved: ---  Primary: MEDICARE Prolia  co-insurance: 0% Admin fee co-insurance: 0%  Secondary: AETNA-MEDSUP Prolia  co-insurance:  Admin fee co-insurance:   Medical Benefit Details: Date Benefits were checked: 11/27/23 Deductible: $257 Met of $257 Required/ Coinsurance: 0%/ Admin Fee: 0%  Prior Auth: N/A PA# Expiration Date:   # of doses approved: ----------------------------------------------------------------------- Option# 2- Med Obtained from pharmacy:  Pharmacy benefit: Copay $--- (Paid to pharmacy) Admin Fee: --- (Pay at clinic)  Prior Auth: --- PA# Expiration Date:   # of doses approved:   If patient wants fill through the pharmacy benefit please send prescription to: ---, and include estimated need by date in rx notes. Pharmacy will ship medication directly to the office.  Patient NOT eligible for Prolia  Copay Card. Copay Card can make patient's cost as little as $25. Link to apply: [email or web address]://[email or web address]  ** This summary of benefits is an estimation of the patient's out-of-pocket cost. Exact cost may very based on individual plan coverage.

## 2023-11-29 ENCOUNTER — Other Ambulatory Visit: Payer: Self-pay | Admitting: Family Medicine

## 2023-11-29 DIAGNOSIS — M5481 Occipital neuralgia: Secondary | ICD-10-CM | POA: Diagnosis not present

## 2023-11-29 DIAGNOSIS — M7918 Myalgia, other site: Secondary | ICD-10-CM | POA: Diagnosis not present

## 2023-11-29 NOTE — Telephone Encounter (Signed)
 Last office visit 10/10/2023 for Compression Fx.  Last refilled 06//23/2025 for #120 with 1 refill.  Next appt: CPE 04/16/2024.

## 2023-11-30 MED ORDER — GABAPENTIN 100 MG PO CAPS: 100.0000 mg | ORAL_CAPSULE | Freq: Four times a day (QID) | ORAL | 1 refills | Status: DC

## 2023-12-01 DIAGNOSIS — M7918 Myalgia, other site: Secondary | ICD-10-CM | POA: Diagnosis not present

## 2023-12-01 DIAGNOSIS — M5481 Occipital neuralgia: Secondary | ICD-10-CM | POA: Diagnosis not present

## 2023-12-07 DIAGNOSIS — M5414 Radiculopathy, thoracic region: Secondary | ICD-10-CM | POA: Diagnosis not present

## 2023-12-13 ENCOUNTER — Other Ambulatory Visit: Payer: Self-pay | Admitting: *Deleted

## 2023-12-13 DIAGNOSIS — M791 Myalgia, unspecified site: Secondary | ICD-10-CM | POA: Diagnosis not present

## 2023-12-13 DIAGNOSIS — G43719 Chronic migraine without aura, intractable, without status migrainosus: Secondary | ICD-10-CM | POA: Diagnosis not present

## 2023-12-13 DIAGNOSIS — M810 Age-related osteoporosis without current pathological fracture: Secondary | ICD-10-CM

## 2023-12-13 DIAGNOSIS — G518 Other disorders of facial nerve: Secondary | ICD-10-CM | POA: Diagnosis not present

## 2023-12-13 DIAGNOSIS — M542 Cervicalgia: Secondary | ICD-10-CM | POA: Diagnosis not present

## 2023-12-13 NOTE — Addendum Note (Signed)
 Addended by: Meerab Maselli E on: 12/13/2023 03:55 PM   Modules accepted: Orders

## 2023-12-14 ENCOUNTER — Ambulatory Visit: Payer: Self-pay

## 2023-12-14 NOTE — Telephone Encounter (Signed)
 Appointment with Dr. Avelina 12/15/2023

## 2023-12-14 NOTE — Telephone Encounter (Signed)
 FYI Only or Action Required?: FYI only for provider.  Patient was last seen in primary care on 10/10/2023 by Avelina Greig BRAVO, MD.  Called Nurse Triage reporting Fall.  Symptoms began today.  Interventions attempted: Nothing.  Symptoms are: unchanged.  Triage Disposition: See PCP When Office is Open (Within 3 Days)  Patient/caregiver understands and will follow disposition?: yes         Copied from CRM #[id number]. Topic: Clinical - Red Word Triage >> Dec 14, 2023 10:31 AM Mia F wrote: Red Word that prompted transfer to Nurse Triage: Pt fell this morning. He has been using a cane and loss balance and fell. He hurt his tailbone. Theres two knots where he fell. Reason for Disposition  [1] Taking Coumadin (warfarin) or other strong blood thinner AND [2] falling is a recurrent problem  Answer Assessment - Initial Assessment Questions 1. MECHANISM: How did the fall happen?     Loss balance 2. DOMESTIC VIOLENCE AND ELDER ABUSE SCREENING: Did you fall because someone pushed you or tried to hurt you? If Yes, ask: Are you safe now?     N/a 3. ONSET: When did the fall happen? (e.g., minutes, hours, or days ago)     This am 0800 4. LOCATION: What part of the body hit the ground? (e.g., back, buttocks, head, hips, knees, hands, head, stomach)     Right of tailbone 5. INJURY: Did you hurt (injure) yourself when you fell? If Yes, ask: What did you injure? Tell me more about this? (e.g., body area; type of injury; pain severity)     2 big knots  6. PAIN: Is there any pain? If Yes, ask: How bad is the pain? (e.g., Scale 0-10; or none, mild,      10 7. SIZE: For cuts, bruises, or swelling, ask: How large is it? (e.g., inches or centimeters)      1/2 to 1 inch in size  9. OTHER SYMPTOMS: Do you have any other symptoms? (e.g., dizziness, fever, weakness; new-onset or worsening).      no 10. CAUSE: What do you think caused the fall (or falling)? (e.g., dizzy spell,  tripped)       tripped  Protocols used: Falls and [facility]

## 2023-12-15 ENCOUNTER — Telehealth: Payer: Self-pay | Admitting: Family Medicine

## 2023-12-15 ENCOUNTER — Ambulatory Visit: Payer: Self-pay | Admitting: Family Medicine

## 2023-12-15 ENCOUNTER — Ambulatory Visit: Admitting: Family Medicine

## 2023-12-15 ENCOUNTER — Encounter: Payer: Self-pay | Admitting: Family Medicine

## 2023-12-15 ENCOUNTER — Ambulatory Visit (INDEPENDENT_AMBULATORY_CARE_PROVIDER_SITE_OTHER)
Admission: RE | Admit: 2023-12-15 | Discharge: 2023-12-15 | Disposition: A | Discharge status: Home / Self Care | Source: Ambulatory Visit | Attending: Family Medicine | Admitting: Family Medicine

## 2023-12-15 VITALS — BP 102/62 | HR 61 | Temp 98.3°F | Ht 72.0 in | Wt 120.1 lb

## 2023-12-15 DIAGNOSIS — M7918 Myalgia, other site: Secondary | ICD-10-CM

## 2023-12-15 DIAGNOSIS — R29898 Other symptoms and signs involving the musculoskeletal system: Secondary | ICD-10-CM

## 2023-12-15 DIAGNOSIS — M1610 Unilateral primary osteoarthritis, unspecified hip: Secondary | ICD-10-CM | POA: Diagnosis not present

## 2023-12-15 DIAGNOSIS — S7001XA Contusion of right hip, initial encounter: Secondary | ICD-10-CM

## 2023-12-15 DIAGNOSIS — W19XXXA Unspecified fall, initial encounter: Secondary | ICD-10-CM

## 2023-12-15 DIAGNOSIS — M810 Age-related osteoporosis without current pathological fracture: Secondary | ICD-10-CM | POA: Diagnosis not present

## 2023-12-15 DIAGNOSIS — M47816 Spondylosis without myelopathy or radiculopathy, lumbar region: Secondary | ICD-10-CM | POA: Diagnosis not present

## 2023-12-15 DIAGNOSIS — M25551 Pain in right hip: Secondary | ICD-10-CM | POA: Diagnosis not present

## 2023-12-15 DIAGNOSIS — M160 Bilateral primary osteoarthritis of hip: Secondary | ICD-10-CM | POA: Diagnosis not present

## 2023-12-15 DIAGNOSIS — M533 Sacrococcygeal disorders, not elsewhere classified: Secondary | ICD-10-CM | POA: Diagnosis not present

## 2023-12-15 MED ORDER — ZONISAMIDE 25 MG PO CAPS: 25.0000 mg | ORAL_CAPSULE | Freq: Every day | ORAL | 0 refills | Status: DC

## 2023-12-15 MED ORDER — ZOLPIDEM TARTRATE 10 MG PO TABS: 5.0000 mg | ORAL_TABLET | Freq: Every evening | ORAL | Status: DC | PRN

## 2023-12-15 NOTE — Progress Notes (Signed)
 Patient ID: Shawn Lam, male    DOB: [birth year], 73 y.o.   MRN: [id number]  This visit was conducted in person.  BP 102/62   Pulse 61   Temp 98.3 F (36.8 C) (Temporal)   Ht 6' (1.829 m)   Wt 120 lb 2 oz (54.5 kg)   SpO2 99%   BMI 16.29 kg/m    CC:  Chief Complaint  Patient presents with   Fall    Wednesday    Tailbone Pain    Knots beside Tailbone    Subjective:   HPI: [name] is a 73 y.o. male presenting on 12/15/2023 for Fall (Wednesday/) and Tailbone Pain (Knots beside Tailbone)    Accidental fall, lost balance fell backward onto butt, occurred in bathroom.  He feels legs are weaker overall.. he feels sudden onset in last  week.  Has been having trouble walking, weight bearing.   In last couple of days he has had severe constipation.. using ducloax.  Recent few days ago.  Since then he has had significant tailbone pain.  He has noted knots beside his tailbone.     He has lost weight in last few weeks...   He does not each much each day per wife. Pt states appetite is good.  Speech is more slurred     New med in last zonegran  50 mg daily Dr. Oneita..  started 3-4 weeks ago... increase 1 week ago to 50.  On primidone  for tremor... pt does not remember tremor.  Has not been taking tizanidine  Back and neck pain doing better.  Wt Readings from Last 3 Encounters:  12/29/23 119 lb (54 kg)  12/25/23 122 lb 4 oz (55.5 kg)  12/20/23 122 lb (55.3 kg)    Relevant past medical, surgical, family and social history reviewed and updated as indicated. Interim medical history since our last visit reviewed. Allergies and medications reviewed and updated. Outpatient Medications Prior to Visit  Medication Sig Dispense Refill   aspirin  EC 81 MG tablet Take 81 mg by mouth daily.     atorvastatin  (LIPITOR ) 80 MG tablet TAKE 1 TABLET BY MOUTH DAILY 90 tablet 3   baclofen (LIORESAL) 10 MG tablet Take 10 mg by mouth 2 (two) times daily.     buPROPion  (WELLBUTRIN   XL) 150 MG 24 hr tablet TAKE 3 TABLETS BY MOUTH DAILY 90 tablet 1   busPIRone  (BUSPAR ) 10 MG tablet TAKE 2 TABLETS BY MOUTH 3 TIMES A DAY 180 tablet 5   Cholecalciferol  (VITAMIN D3) 50 MCG (2000 UT) TABS Take 2,000 Units by mouth daily.     clopidogrel  (PLAVIX ) 75 MG tablet TAKE 1 TABLET BY MOUTH DAILY 90 tablet 3   cyanocobalamin  (VITAMIN B12) 1000 MCG tablet Take 1,000 mcg by mouth daily.     denosumab  (PROLIA ) 60 MG/ML SOSY injection Inject 60 mg into the skin every 6 (six) months. Do not send RX, order this from the office     DULoxetine  (CYMBALTA ) 30 MG capsule TAKE THREE CAPSULES BY MOUTH DAILY 90 capsule 5   Erenumab-aooe (AIMOVIG) 140 MG/ML SOAJ 140 mg every 28 (twenty-eight) days.     finasteride  (PROSCAR ) 5 MG tablet Take 5 mg by mouth daily.     levothyroxine  (SYNTHROID ) 88 MCG tablet TAKE 1 TABLET BY MOUTH DAILY BEFORE BREAKFAST 90 tablet 3   Magnesium  250 MG CAPS Take 250 mg by mouth daily.     metoCLOPramide (REGLAN) 10 MG tablet Take 10 mg by mouth every  8 (eight) hours as needed.     metoprolol  succinate (TOPROL  XL) 25 MG 24 hr tablet Take 1 tablet (25 mg total) by mouth daily. 90 tablet 3   nitroGLYCERIN  (NITROSTAT ) 0.4 MG SL tablet Place 1 tablet (0.4 mg total) under the tongue every 5 (five) minutes as needed for chest pain. 10 tablet 1   gabapentin  (NEURONTIN ) 100 MG capsule Take 1 capsule (100 mg total) by mouth 4 (four) times daily. (Patient not taking: Reported on 12/25/2023) 120 capsule 1   losartan  (COZAAR ) 50 MG tablet Take 1 tablet (50 mg total) by mouth in the morning and at bedtime. 180 tablet 3   primidone  (MYSOLINE ) 50 MG tablet Take 50 mg by mouth 2 (two) times daily.     tiZANidine (ZANAFLEX) 4 MG tablet Take 4 mg by mouth 2 (two) times daily.     triamcinolone  cream (KENALOG ) 0.1 % APPLY TOPICALLY 2 TIMES A DAY AS NEEDED .DO NOT APPLY TO FACE, GROIN AND UNDERARMS 454 g 0   zolpidem  (AMBIEN ) 10 MG tablet TAKE 1 TABLET BY MOUTH EVERY NIGHT AT BEDTIME AS NEEDED 30  tablet 0   zonisamide  (ZONEGRAN ) 50 MG capsule Take 50 mg by mouth daily.     zonisamide  (ZONEGRAN ) 25 MG capsule Take 25 mg by mouth 4 (four) times daily.     Facility-Administered Medications Prior to Visit  Medication Dose Route Frequency Provider Last Rate Last Admin   denosumab  (PROLIA ) injection 60 mg  60 mg Subcutaneous Once Elgar Scoggins E, MD       [COMPLETED] denosumab  (PROLIA ) injection 60 mg  60 mg Subcutaneous Once Larine Fielding E, MD   60 mg at 01/02/24 1410     Per HPI unless specifically indicated in ROS section below Review of Systems  Constitutional:  Positive for fatigue. Negative for fever.  HENT:  Negative for ear pain.   Eyes:  Negative for pain.  Respiratory:  Negative for cough and shortness of breath.   Cardiovascular:  Negative for chest pain, palpitations and leg swelling.  Gastrointestinal:  Negative for abdominal pain.  Genitourinary:  Negative for dysuria.  Musculoskeletal:  Positive for back pain. Negative for arthralgias.  Neurological:  Positive for weakness. Negative for syncope, light-headedness and headaches.  Psychiatric/Behavioral:  Negative for dysphoric mood.    Objective:  BP 102/62   Pulse 61   Temp 98.3 F (36.8 C) (Temporal)   Ht 6' (1.829 m)   Wt 120 lb 2 oz (54.5 kg)   SpO2 99%   BMI 16.29 kg/m   Wt Readings from Last 3 Encounters:  12/29/23 119 lb (54 kg)  12/25/23 122 lb 4 oz (55.5 kg)  12/20/23 122 lb (55.3 kg)      Physical Exam Constitutional:      Appearance: He is well-developed. He is cachectic.  HENT:     Head: Normocephalic.     Right Ear: Hearing normal.     Left Ear: Hearing normal.     Nose: Nose normal.  Neck:     Thyroid : No thyroid  mass or thyromegaly.     Vascular: No carotid bruit.     Trachea: Trachea normal.  Cardiovascular:     Rate and Rhythm: Normal rate and regular rhythm.     Pulses: Normal pulses.     Heart sounds: Heart sounds not distant. No murmur heard.    No friction rub. No gallop.      Comments: No peripheral edema Pulmonary:     Effort: Pulmonary effort  is normal. No respiratory distress.     Breath sounds: Normal breath sounds.  Musculoskeletal:     Lumbar back: No tenderness or bony tenderness. Decreased range of motion. Negative right straight leg raise test and negative left straight leg raise test.     Comments: Tenderness to palpation over right buttock, hematoma in right buttock.  No focal vertebral tenderness, no focal pelvis pain. Full range of motion of bilateral hips.  Skin:    General: Skin is warm and dry.     Findings: No rash.  Psychiatric:        Speech: Speech normal.        Behavior: Behavior normal.        Thought Content: Thought content normal.       Results for orders placed or performed in visit on 08/15/23  CBC with Differential/Platelet   Collection Time: 08/15/23  8:15 AM  Result Value Ref Range   WBC 5.5 4.0 - 10.5 K/uL   RBC 3.72 (L) 4.22 - 5.81 Mil/uL   Hemoglobin 12.4 (L) 13.0 - 17.0 g/dL   HCT 62.0 (L) 60.9 - 47.9 %   MCV 102.1 (H) 78.0 - 100.0 fl   MCHC 32.8 30.0 - 36.0 g/dL   RDW 85.0 88.4 - 84.4 %   Platelets 235.0 150.0 - 400.0 K/uL   Neutrophils Relative % 66.5 43.0 - 77.0 %   Lymphocytes Relative 24.0 12.0 - 46.0 %   Monocytes Relative 5.3 3.0 - 12.0 %   Eosinophils Relative 3.2 0.0 - 5.0 %   Basophils Relative 1.0 0.0 - 3.0 %   Neutro Abs 3.7 1.4 - 7.7 K/uL   Lymphs Abs 1.3 0.7 - 4.0 K/uL   Monocytes Absolute 0.3 0.1 - 1.0 K/uL   Eosinophils Absolute 0.2 0.0 - 0.7 K/uL   Basophils Absolute 0.1 0.0 - 0.1 K/uL  Iron, TIBC and Ferritin Panel   Collection Time: 08/15/23  8:15 AM  Result Value Ref Range   Iron 70 50 - 180 mcg/dL   TIBC 777 (L) 749 - 574 mcg/dL (calc)   %SAT 32 20 - 48 % (calc)   Ferritin 337 24 - 380 ng/mL  Hepatitis panel, acute   Collection Time: 08/15/23  8:15 AM  Result Value Ref Range   Hep A IgM NON-REACTIVE NON-REACTIVE   Hepatitis B Surface Ag NON-REACTIVE NON-REACTIVE   Hep B C IgM  NON-REACTIVE NON-REACTIVE   Hepatitis C Ab NON-REACTIVE NON-REACTIVE  Hepatic function panel   Collection Time: 08/15/23  8:15 AM  Result Value Ref Range   Total Bilirubin 0.6 0.2 - 1.2 mg/dL   Bilirubin, Direct 0.1 0.0 - 0.3 mg/dL   Alkaline Phosphatase 55 39 - 117 U/L   AST 47 (H) 0 - 37 U/L   ALT 43 0 - 53 U/L   Total Protein 5.9 (L) 6.0 - 8.3 g/dL   Albumin 3.9 3.5 - 5.2 g/dL   *Note: Due to a large number of results and/or encounters for the requested time period, some results have not been displayed. A complete set of results can be found in Results Review.    Assessment and Plan  Fall, initial encounter Assessment & Plan: Significant increase in loss of balance and weakness in legs overall. Of note patient on multiple sedating medications. Severe weight loss in the last several weeks, decreased appetite given difficulty swallowing.  DME order for rolling walker provided.   Orders: -     For home use only DME 4 wheeled  rolling walker with seat -     DG Sacrum/Coccyx; Future -     DG HIP UNILAT W OR W/O PELVIS 2-3 VIEWS RIGHT; Future  Bilateral leg weakness Assessment & Plan: Acute, worsening, most likely multifactorial. In part due to over sedating medications Wean off  zonegran ... cannot cut this.SABRA decrease to 25 mg daily x 3-4 days then stop.  Then cut primidone  in half and take twice daily x 3-4 days then stop.  Wean off gabapentin  by decreasing 1 tablet a day  every 3 days.  Do not take tizanidine.  Decreased Ambien  to 1/2 tablet at night.  Orders: -     For home use only DME 4 wheeled rolling walker with seat -     DG Sacrum/Coccyx; Future -     DG HIP UNILAT W OR W/O PELVIS 2-3 VIEWS RIGHT; Future  Hematoma of right hip, initial encounter -     DG Sacrum/Coccyx; Future -     DG HIP UNILAT W OR W/O PELVIS 2-3 VIEWS RIGHT; Future  Acute buttock pain Assessment & Plan: Acute, after fall in patient with osteoporosis on Prolia .  Will evaluate with x-ray of  pelvis and bilateral hips.  Orders: -     DG Sacrum/Coccyx; Future -     DG HIP UNILAT W OR W/O PELVIS 2-3 VIEWS RIGHT; Future  Osteoporosis without current pathological fracture, unspecified osteoporosis type -     DG Sacrum/Coccyx; Future -     DG HIP UNILAT W OR W/O PELVIS 2-3 VIEWS RIGHT; Future  Other orders -     Zonisamide ; Take 1 capsule (25 mg total) by mouth daily.  Dispense: 4 capsule; Refill: 0    Return in about 2 weeks (around 12/29/2023) for  follow up weakness and weight loss.SABRA Greig Ring, MD

## 2023-12-15 NOTE — Telephone Encounter (Signed)
 Shawn Lam is asking if you will sent in a order for a portable oxygen  concentrator to [employer].

## 2023-12-15 NOTE — Telephone Encounter (Signed)
 Pt will like a a walker with wheel ordered for him Dr. Avelina is already awre just a FYI msg. Please advise,  Thanks

## 2023-12-15 NOTE — Patient Instructions (Addendum)
 Wean off  zonegran ... cannot cut this.SABRA decrease to 25 mg daily x 3-4 days then stop.  Then cut primidone  in half and take twice daily x 3-4 days then stop.  Wean off gabapentin  by decreasing 1 tablet a day  every 3 days.  Do not take tizanidine.  Decreased Ambien  to 1/2 tablet at night.

## 2023-12-15 NOTE — Telephone Encounter (Signed)
 Order for Rollator Vannie has been sent to [facility]. SABRA

## 2023-12-15 NOTE — Telephone Encounter (Signed)
 Phone call to patient.  He is unsure who has been signing orders for his oxygen .  He will reach out to that provider.

## 2023-12-18 ENCOUNTER — Other Ambulatory Visit: Payer: Self-pay | Admitting: Family Medicine

## 2023-12-18 NOTE — Telephone Encounter (Signed)
 Added to med list on 12/15/23, cannot see active prescription that was recently sent.  LOV 12/15/23  for fall NOV nothing scheduled

## 2023-12-20 ENCOUNTER — Ambulatory Visit: Admitting: Pulmonary Disease

## 2023-12-20 ENCOUNTER — Encounter: Payer: Self-pay | Admitting: Pulmonary Disease

## 2023-12-20 ENCOUNTER — Ambulatory Visit: Attending: Neurosurgery | Admitting: Physical Therapy

## 2023-12-20 VITALS — BP 122/88 | HR 68 | Temp 97.6°F | Ht 72.0 in | Wt 122.0 lb

## 2023-12-20 DIAGNOSIS — Z8701 Personal history of pneumonia (recurrent): Secondary | ICD-10-CM

## 2023-12-20 DIAGNOSIS — R633 Feeding difficulties, unspecified: Secondary | ICD-10-CM | POA: Insufficient documentation

## 2023-12-20 DIAGNOSIS — G4733 Obstructive sleep apnea (adult) (pediatric): Secondary | ICD-10-CM | POA: Diagnosis not present

## 2023-12-20 DIAGNOSIS — R918 Other nonspecific abnormal finding of lung field: Secondary | ICD-10-CM | POA: Diagnosis not present

## 2023-12-20 DIAGNOSIS — M546 Pain in thoracic spine: Secondary | ICD-10-CM | POA: Insufficient documentation

## 2023-12-20 DIAGNOSIS — M542 Cervicalgia: Secondary | ICD-10-CM | POA: Diagnosis not present

## 2023-12-20 DIAGNOSIS — U099 Post covid-19 condition, unspecified: Secondary | ICD-10-CM

## 2023-12-20 DIAGNOSIS — Z9682 Presence of neurostimulator: Secondary | ICD-10-CM | POA: Diagnosis not present

## 2023-12-20 DIAGNOSIS — R1312 Dysphagia, oropharyngeal phase: Secondary | ICD-10-CM

## 2023-12-20 DIAGNOSIS — M4802 Spinal stenosis, cervical region: Secondary | ICD-10-CM

## 2023-12-20 DIAGNOSIS — R1310 Dysphagia, unspecified: Secondary | ICD-10-CM | POA: Diagnosis not present

## 2023-12-20 DIAGNOSIS — R9389 Abnormal findings on diagnostic imaging of other specified body structures: Secondary | ICD-10-CM

## 2023-12-20 DIAGNOSIS — R0602 Shortness of breath: Secondary | ICD-10-CM

## 2023-12-20 LAB — NITRIC OXIDE: Nitric Oxide: 5

## 2023-12-20 NOTE — Patient Instructions (Signed)
 VISIT SUMMARY:  You came in today due to an abnormal CT scan of your chest and shortness of breath. We discussed your recent neck surgery, difficulty swallowing, weight loss, and history of heart problems. We also reviewed your history of an Inspire device placement and previous COVID-19 hospitalization.  YOUR PLAN:  -RIGHT LUNG PREDOMINANT INFILTRATES AND SCARRING WITH SUSPECTED ASPIRATION AND DYSPHAGIA: Your CT scan shows spots and scarring in your right lung, likely due to food or liquid entering your lungs (aspiration) and difficulty swallowing (dysphagia). This may be related to your recent neck surgery. We will order a swallow study to evaluate your swallowing difficulties and consult a speech pathologist for further management. We will repeat the chest CT in October to monitor any changes in your lungs.  -SHORTNESS OF BREATH: You have been experiencing shortness of breath, especially when walking short distances. This could be related to your heart condition. We will order breathing tests to assess your lung function and coordinate with your cardiologist to consider an echocardiogram.  -OBSTRUCTIVE SLEEP APNEA WITH INSPIRE DEVICE: You have an Inspire device to help with sleep apnea, which was placed over ten years ago. You have noticed occasional tingling sensations since your neck surgery. We will refer you to a sleep specialist to evaluate the device and coordinate with the [facility] team for further management.  INSTRUCTIONS:  Please complete the swallow study as ordered and follow up with the speech pathologist for your swallowing difficulties. Repeat the chest CT in October as planned. Attend your breathing tests and coordinate with your cardiologist, Dr. Letha, regarding a potential echocardiogram. Additionally, follow up with the sleep specialist for your Inspire device evaluation.

## 2023-12-20 NOTE — Therapy (Unsigned)
 OUTPATIENT PHYSICAL THERAPY CERVICAL EVALUATION   Patient Name: Shawn Lam MRN: [id number] DOB:[birth year], 73 y.o., male Today's Date: 12/20/2023  END OF SESSION:  PT End of Session - 12/20/23 1702     Visit Number 1    Number of Visits 24    Date for PT Re-Evaluation 03/13/24    Authorization Type Medicare 2025    Authorization - Visit Number 1    Authorization - Number of Visits 24    Progress Note Due on Visit 10    PT Start Time 1345    PT Stop Time 1430    PT Time Calculation (min) 45 min    Activity Tolerance Patient tolerated treatment well    Behavior During Therapy [facility] for tasks assessed/performed          Past Medical History:  Diagnosis Date   Actinic keratosis 02/04/2021   right lateral neck   Anemia    Anxiety    Asymptomatic LV dysfunction    50-55% by echo 06/2016   BCC (basal cell carcinoma of skin) 08/05/2020   left neck infra auricular - nodular pattern - treated with [facility] 09/17/2020   Berger's disease    CAD (coronary artery disease) 06/2005   PCI of LAD and RCA   Depression    Dilated aortic root (HCC)    4cm at sinus of Valsalva   Hematuria    Hyperlipidemia    Hypothyroidism    Insomnia    Left ureteral stone    Orthostatic hypotension    negative tilt table   OSA (obstructive sleep apnea)    moderate with AHI 17/hr, uses CPAP nightly(01/15/19 - has Inspire implant)   PVC's (premature ventricular contractions) 07/07/2016   Renal disorder    PT IS SEEING DR. BETSEY- NEPHROLOGIST   Rosacea    Urticaria    Past Surgical History:  Procedure Laterality Date   ANTERIOR CERVICAL DECOMP/DISCECTOMY FUSION N/A 03/13/2023   Procedure: CERVICAL FOUR-FIVE, CERVICAL FIVE-SIX ANTERIOR CERVICAL DISCECTOMY/DECOMPRESSION FUSION;  Surgeon: Onetha Kuba, MD;  Location: [facility] OR;  Service: Neurosurgery;  Laterality: N/A;  3C   CARDIAC CATHETERIZATION  09/06/2013   [facility]   CARDIAC CATHETERIZATION     COLONOSCOPY WITH PROPOFOL  N/A 01/21/2019   Procedure:  COLONOSCOPY WITH PROPOFOL ;  Surgeon: Jinny Carmine, MD;  Location: [facility];  Service: Endoscopy;  Laterality: N/A;  sleep apnea   COLONOSCOPY WITH PROPOFOL  N/A 10/19/2021   Procedure: COLONOSCOPY WITH PROPOFOL ;  Surgeon: Jinny Carmine, MD;  Location: [facility] ENDOSCOPY;  Service: Endoscopy;  Laterality: N/A;   CORONARY ANGIOPLASTY  2004   STENT PLACEMENT   CYSTOSCOPY WITH RETROGRADE PYELOGRAM, URETEROSCOPY AND STENT PLACEMENT Left 03/19/2014   Procedure: CYSTOSCOPY WITH RETROGRADE PYELOGRAM, URETEROSCOPY AND STENT PLACEMENT;  Surgeon: Mickey Ricardo KATHEE Alvaro, MD;  Location: [facility] ORS;  Service: Urology;  Laterality: Left;   CYSTOSCOPY WITH RETROGRADE PYELOGRAM, URETEROSCOPY AND STENT PLACEMENT Bilateral 05/20/2015   Procedure:  CYSTOSCOPY WITH BILATERAL RETROGRADE PYELOGRAM,RIGHT  DIAGNOSTIC URETEROSCOPY ,LEFT URETEROSCOPY WITH HOLMIUM LASER  AND BILATERAL  STENT PLACEMENT ;  Surgeon: Ricardo Alvaro, MD;  Location: [facility];  Service: Urology;  Laterality: Bilateral;   DRUG INDUCED ENDOSCOPY N/A 10/20/2017   Procedure: DRUG INDUCED SLEEP ENDOSCOPY;  Surgeon: Carlie Clark, MD;  Location: [facility];  Service: ENT;  Laterality: N/A;   EP IMPLANTABLE DEVICE N/A 05/13/2016   Procedure: Loop Recorder Insertion;  Surgeon: Elspeth JAYSON Sage, MD;  Location: [facility];  Service: Cardiovascular;  Laterality: N/A;  ESOPHAGOGASTRODUODENOSCOPY N/A 10/19/2021   Procedure: ESOPHAGOGASTRODUODENOSCOPY (EGD);  Surgeon: Jinny Carmine, MD;  Location: [facility] ENDOSCOPY;  Service: Endoscopy;  Laterality: N/A;   ESOPHAGOGASTRODUODENOSCOPY (EGD) WITH PROPOFOL  N/A 11/30/2018   Procedure: ESOPHAGOGASTRODUODENOSCOPY (EGD) WITH PROPOFOL ;  Surgeon: Therisa Bi, MD;  Location: [facility] ENDOSCOPY;  Service: Gastroenterology;  Laterality: N/A;   ESOPHAGOGASTRODUODENOSCOPY (EGD) WITH PROPOFOL  N/A 12/01/2018   Procedure: ESOPHAGOGASTRODUODENOSCOPY (EGD) WITH PROPOFOL ;  Surgeon: Jinny Carmine, MD;  Location: [facility] ENDOSCOPY;   Service: Endoscopy;  Laterality: N/A;   HOLMIUM LASER APPLICATION Bilateral 05/20/2015   Procedure: HOLMIUM LASER APPLICATION;  Surgeon: Ricardo Likens, MD;  Location: [facility];  Service: Urology;  Laterality: Bilateral;   IMPLIMENTATION OF A HYPOGLOSSAL NERVE STIMULATOR  12/08/2017   OSA    LEFT HEART CATHETERIZATION WITH CORONARY ANGIOGRAM N/A 09/06/2013   Procedure: LEFT HEART CATHETERIZATION WITH CORONARY ANGIOGRAM;  Surgeon: Victory LELON Claudene DOUGLAS, MD;  Location: [facility] CATH LAB;  Service: Cardiovascular;  Laterality: N/A;   LEFT KNEE CAP  SURGERY ABOUT 40 YRS AGO  ? 1970     STONE EXTRACTION WITH BASKET Left 03/19/2014   Procedure: STONE EXTRACTION WITH BASKET;  Surgeon: Mickey Ricardo KATHEE Likens, MD;  Location: [facility];  Service: Urology;  Laterality: Left;   TRANSURETHRAL RESECTION OF PROSTATE N/A 08/25/2021   Procedure: TRANSURETHRAL RESECTION OF THE PROSTATE (TURP);  Surgeon: Likens Ricardo, MD;  Location: [facility];  Service: Urology;  Laterality: N/A;  1 HR   Patient Active Problem List   Diagnosis Date Noted   Feeding difficulties, unspecified 12/20/2023   Shortness of breath 12/20/2023   Abnormal CT of the chest 10/10/2023   Elevated blood sugar 06/30/2023   Carotid stenosis 05/11/2023   Spinal stenosis in cervical region 03/13/2023   Osteoarthritis of spine with radiculopathy, cervical region 07/14/2022   Cervicogenic headache 11/30/2021   Chronic neck pain 11/30/2021   Dysphagia    Polyp of colon    Prostatic hyperplasia 08/25/2021   Chronic insomnia 03/19/2020   Peripheral neuropathy 03/19/2020   Iron deficiency anemia 01/06/2020   Allergic dermatitis 07/08/2019   Osteoarthritis of left knee 02/26/2019   History of colonic polyps    History of duodenal ulcer    CKD (chronic kidney disease), stage IIIa    Bilateral chronic knee pain 11/09/2018   Epistaxis 10/02/2018   Subjective tinnitus, bilateral 10/02/2018   Chronic pain of right thumb 02/22/2018   Acute bilateral thoracic back pain  01/23/2018   Osteoporosis without current pathological fracture 02/10/2017   Hypothyroidism 11/02/2016   Thoracic compression fracture (HCC) 09/06/2016   PVC's (premature ventricular contractions) 07/07/2016   Hypoalbuminemia 06/28/2016   IgA nephropathy 06/07/2016   MDD (major depressive disorder), recurrent episode, moderate (HCC) 11/20/2015   OSA (obstructive sleep apnea) 02/05/2014   Orthostatic hypotension 02/07/2013   Scotoma involving central area 01/29/2013   Optic neuropathy 01/28/2013   HEMATURIA UNSPECIFIED 01/21/2010   HLD (hyperlipidemia) 07/05/2006   Generalized anxiety disorder 07/05/2006   CAD (coronary artery disease) 07/05/2006   GERD 07/05/2006    PCP: Dr. Greig Ring     REFERRING PROVIDER: Dr. Arley Helling    REFERRING DIAG: M54.6 (ICD-10-CM) - Acute bilateral thoracic back pain   THERAPY DIAG:  Cervicalgia  Pain in thoracic spine  Rationale for Evaluation and Treatment: Rehabilitation  ONSET DATE: May 2025    SUBJECTIVE:  SUBJECTIVE STATEMENT: See pertinent history   Hand dominance: Right  PERTINENT HISTORY:  Pt reports being referred to physical therapy for ongoing neck pain following cervical fusion in May 2020 and newly developed thoracic pain from T12 compression fracture that occred sometime in spring of 2025 and has radicular pain patterns. He has since has pain localized to upper traps and suboccipitals and that is non-radiating. He discontinued his prednisone  and gabapentin  two weeks ago and he was unable barely able to walk for two weeks. He describes feeling increased fatigue and weakness to the point where he struggled to walk. Pt also reported repeated falls in the setting of orthostatic hypotension with him experiencing syncope when he stands up  from a seated position. He describes feeling a burning sensation that is also localized to his scapulae and that it occurs when he is doing UE activities like washing his car that is likely the radicular pain from thoracic vertebrae compression fracture. PAIN:  Are you having pain? Yes: NPRS scale: 4-5/10 NRPS, 10/10 (worst-when doing UE activities)  Pain location: Cervical Paraspinal  Pain description: Burning sensation   Aggravating factors: Strenuous activities like washing his car. Relieving factors: Sitting down and thoracic retraction  PRECAUTIONS: None  RED FLAGS: Cervical red flags: Dysphagia Yes: followed by pulmonologist and Dysarthria      WEIGHT BEARING RESTRICTIONS: No  FALLS:  Has patient fallen in last 6 months? Yes. Number of falls 6, pt reports that falls are happening in the context of syncope when standing up from a seated position (Orthostatic Hypotension) , where he blacks out and falls. He is following up with his PCP, Dr. Karlene who is working with him on stabilizing his blood pressure and she has recently taken him off Gabapentin  to see if this helps. Pt reports that he does note an improvement in his symptoms with less light headedness since going off medication.   LIVING ENVIRONMENT: Lives with: lives with their spouse Lives in: House/apartment Stairs: No Has following equipment at home: Single point cane and Environmental consultant - 4 wheeled  OCCUPATION: Retired     PLOF: Independent  PATIENT GOALS: Relief of his neck pain while performing upper extremity activities.    NEXT MD VISIT: October 2025     OBJECTIVE:  Note: Objective measures were completed at Evaluation unless otherwise noted.  VITALS  109/72  hr 72 SpO2 100  DIAGNOSTIC FINDINGS:  CLINICAL DATA:  Back pain with occasional radicular symptoms.   EXAM: MRI THORACIC SPINE WITHOUT CONTRAST   TECHNIQUE: Multiplanar, multisequence MR imaging of the thoracic spine was performed. No intravenous contrast  was administered.   COMPARISON:  MRI of the thoracic spine dated April 17, 2017.   FINDINGS: Alignment:  Physiologic.   Vertebrae: Chronic compression deformities of T7, T8, T9 and T12. There is retropulsion of the posterosuperior corner of T12 again demonstrated, resulting in mild-to-moderate central spinal canal stenosis, but no spinal cord impingement.   Cord:  Normal in morphology and signal intensity.   Paraspinal and other soft tissues: There are patchy, streaky opacities present in the posterior periphery of the lungs bilaterally the immediate paraspinal soft tissues are unremarkable.   Disc levels:   There is minimal posterior disc bulging present at T7-8 common T9-10 and T10-11, causing minimal central spinal canal stenosis at each level. There is mild-to-moderate central spinal canal stenosis at T12 secondary to retropulsion of the superior posterior corner of the vertebral body. Otherwise, on the spinal canal and neural foramina are widely patent  throughout the thoracic region.   IMPRESSION: 1. Chronic compression deformities of T7, T8, T9 and T12, with mild-to-moderate central spinal canal stenosis at T12. There is no adverse interval change. 2. There are patchy, streaky peripheral opacities present posteriorly within the upper lung zones bilaterally, which could be better assessed with a CT of the thorax.     Electronically Signed   By: Evalene Coho M.D.   On: 10/02/2023 17:14  PATIENT SURVEYS:  NDI:  NECK DISABILITY INDEX  Date: 12/20/23 Score  Pain intensity 3 = The pain is fairly severe at the moment  2. Personal care (washing, dressing, etc.) 2 = It is painful to look after myself and I am slow and careful  3. Lifting 4 =  I can only lift very light weights  4. Reading 2 =  I can read as much as I want with moderate pain in my neck  5. Headaches 3 = I have moderate headaches, which come frequently  6. Concentration 3 = I have a lot of difficulty  in concentrating when I want to  7. Work 4 = I can hardly do any work at all  8. Driving 4 =  I can hardly drive at all because of severe pain in my neck  9. Sleeping 3 =  My sleep is moderately disturbed (2-3 hrs sleepless)  10. Recreation 4 =  I can hardly do any recreation activities because of pain in my neck  Total 33/50 (66%)   Minimum Detectable Change (90% confidence): 5 points or 10% points  COGNITION: Overall cognitive status: Within functional limits for tasks assessed  SENSATION: WFL  POSTURE: rounded shoulders  PALPATION: Cervical paraspinal     CERVICAL ROM:   Active ROM A/PROM (deg) eval  Flexion 30/NT   Extension 30/30  Right lateral flexion 35/NT   Left lateral flexion 35/NT  Right rotation 40/NT  Left rotation 40/NT   (Blank rows = not tested)  UPPER EXTREMITY ROM:  Active ROM Right eval Left eval  Shoulder flexion    Shoulder extension    Shoulder abduction    Shoulder adduction    Shoulder extension    Shoulder internal rotation    Shoulder external rotation    Elbow flexion    Elbow extension    Wrist flexion    Wrist extension    Wrist ulnar deviation    Wrist radial deviation    Wrist pronation    Wrist supination     (Blank rows = not tested) Combined Shoulder ER                     Occiput       Occiput   Combined Shoulder IR                       T12                T12   UPPER EXTREMITY MMT:  MMT Right eval Left eval  Shoulder flexion 4 4  Shoulder extension    Shoulder abduction 4 4  Shoulder adduction    Shoulder extension    Shoulder internal rotation 4 4  Shoulder external rotation 4 4  Middle trapezius    Lower trapezius    Elbow flexion    Elbow extension    Wrist flexion    Wrist extension    Wrist ulnar deviation    Wrist radial deviation    Wrist pronation  Wrist supination    Grip strength     (Blank rows = not tested)     TREATMENT DATE: 12/20/23: Bilateral Upper Trap Stretch  2 x 60 sec     Cervical Extensor Stretch 2 x 60 sec                                                                                                                                  PATIENT EDUCATION:  Education details: Form and technique for correct performance of exercise and explanation about cervical deficits    Person educated: Patient Education method: Medical illustrator Education comprehension: verbalized understanding, returned demonstration, and verbal cues required  HOME EXERCISE PROGRAM: Access Code: [email or web address] URL: [email or web address] Date: 12/20/2023 Prepared by: Toribio Servant  Exercises - Seated Cervical Sidebending Stretch  - 1 x daily - 7 x weekly - 3 reps - 30-60 sec  hold - Seated Cervical Sidebending Stretch (Mirrored)  - 1 x daily - 7 x weekly - 3 reps - 30-60 sec  hold - Seated Cervical Flexion Stretch with Finger Support Behind Neck  - 1 x daily - 7 x weekly - 3 reps - 60 sec  hold - Supine Suboccipital Release with Tennis Balls  - 1 x daily - 7 x weekly - 3 reps - 3-5 min hold   ASSESSMENT:  CLINICAL IMPRESSION: Patient is a 73 y.o. white male who was seen today for physical therapy evaluation and treatment for cervical and thoracic pain in the setting of T12 compression fracture earlier this year and prior cervical fusion in 2020. Pmh includes CAD, Pt's deficits include decreased flexibility throughout cervical and periscapular musculature, decreased cervical ROM, decreased shoulder strength and increased pain with UE activity. He has had a number of falls in what appears to be setting of orthostatic hypotension and potential side effects of drugs. Pt is being managed by his PCP for this issue and recent med changes have resulted in decreased falls. PT will still spend more time assessing orthostatic vitals and static and dynamic and static balance to determine if there are any other contributors to his falls. He will benefit from skilled PT to address  these aforementioned deficits to decrease his neck and thoracic spine pain to return to completing UE activities like washing his car and completing yard work without being limited by pain.   OBJECTIVE IMPAIRMENTS: decreased balance, decreased ROM, decreased strength, hypomobility, impaired flexibility, impaired UE functional use, and pain.   ACTIVITY LIMITATIONS: carrying, lifting, standing, bathing, dressing, reach over head, and hygiene/grooming  PARTICIPATION LIMITATIONS: cleaning, laundry, shopping, community activity, and yard work  PERSONAL FACTORS: Age and 3+ comorbidities: CAD, Depression, are also affecting patient's functional outcome.   REHAB POTENTIAL: Good  CLINICAL DECISION MAKING: Stable/uncomplicated  EVALUATION COMPLEXITY: Low   GOALS: Goals reviewed with patient? No  SHORT TERM GOALS: Target date: 01/03/2024   Patient will demonstrate undestanding of home exercise plan by performing exercises  correctly with evidence of good carry over with min to no verbal or tactile cues .   Baseline: NT  Goal status: INITIAL  2.  Patient will demonstrate understanding of how to modify exercises to decrease thoracic and cervical pain exacerbation for improved function and to decrease incidence of pain flare ups.  Baseline: Does not currently have strategy to manage this  Goal status: INITIAL   LONG TERM GOALS: Target date: 03/13/2024  Patient will show a statistically significant improvement in her neck function as evidence by >=7.5 pt decrease in her neck disability index score to better be able to move neck to improve visual field while driving to avoid accidents. Vernel et al, 2009) Baseline: 33/50 (66%) Goal status: INITIAL  2.  Patient will increase cervical mobility by  >=5 degrees AROM for all planes of motion (flexion, extension, side bending, and rotation) for improved cervical function and evidence of pain reduction. Baseline: Cervical AROM Flex 30, Ext 30, Rot R/L  40/40, SB R/L 35/35    Goal status: INITIAL  3.  Patient will improve shoulder and periscapular strength by 1/3 grade MMT (4- to 4) for improved cervical stability and UE function to carry out upper extremity activities like washing his car and gardening without being limited by pain and discomfort.  Baseline: Shoulder Flex R/L 4/4, Shoulder Abd R/L 4/4    Goal status: INITIAL  4.  Patient will reduce neck and thoracic pain to <=4/10 NRPS while performing UE activities like washing his car and doing yard work as evidence of improved cervical and thoracic function.  Baseline: 10/10 cervical paraspinal and rhomboid pain with UE activity.  Goal status: INITIAL     PLAN:  PT FREQUENCY: 1-2x/week  PT DURATION: 12 weeks  PLANNED INTERVENTIONS: 97164- PT Re-evaluation, 97750- Physical Performance Testing, 97110-Therapeutic exercises, 97530- Therapeutic activity, [id number]- Neuromuscular re-education, 97535- Self Care, [id number]- Manual therapy, [id number]- Gait training, [phone]- Canalith repositioning, [id number]- Aquatic Therapy, [phone]- Electrical stimulation (unattended), [phone]- Electrical stimulation (manual), [id number]- Traction (mechanical), 20560 (1-2 muscles), 20561 (3+ muscles)- Dry Needling, Patient/Family education, Balance training, Joint mobilization, Joint manipulation, Spinal manipulation, Spinal mobilization, Vestibular training, DME instructions, Cryotherapy, and Moist heat  PLAN FOR NEXT SESSION: Orthostatic vitals, periscapular strength testing, Cervical PROM in supine and shoulder AROM measurements. Fall Screen: 4 stage balance.  Manual therapy massage and trigger point release of periscapular and cervical musculature.    Toribio Servant PT, DPT  [facility] [address] [place], [address] Phone: [phone]   Fax:  [phone]

## 2023-12-20 NOTE — Progress Notes (Signed)
 Subjective:    Patient ID: Shawn Lam, male    DOB: 1950-05-19, 73 y.o.   MRN: [id number]  Patient Care Team: Avelina Greig BRAVO, MD as PCP - General Perla Evalene PARAS, MD as PCP - Cardiology (Cardiology) Fate Morna SAILOR, [facility] (Inactive) as Pharmacist (Pharmacist) Laurice Francis NOVAK, [place] (Optometry) Onetha Kuba, MD as Consulting Physician (Neurosurgery)  Chief Complaint  Patient presents with   Consult    Shortness of breath on exertion.     BACKGROUND: Patient is a 73 year old lifelong never smoker who presents for evaluation of an abnormal CT scan of the chest obtained on 08 November 2023.  He is kindly referred by Dr. Greig Avelina.  Patient previously seen by Dr. Adine Gift on 13 February 2020 for abnormal CT scan after COVID-19 infection.   HPI Discussed the use of AI scribe software for clinical note transcription with the patient, who gave verbal consent to proceed.  History of Present Illness   Shawn Lam is a 73 year old male who presents with an abnormal CT scan of the chest and shortness of breath. He was referred by Dr. Greig Avelina for evaluation of an abnormal CT scan of the chest.  He presents with his wife Shawn Lam.  A CT scan performed following a cervical decompression  revealed concerning ground glass opacities on his right lung. This scan was conducted approximately two months ago. His neurologist noted the opacities and referred him to his medical doctor, who then referred him for further evaluation.  He experiences difficulty swallowing, particularly with pills, which tend to get stuck on the right side of his throat. This issue began after his neck surgery, which occurred about two to three months ago. He often lets pills dissolve in his throat due to this difficulty. He has also experienced significant weight loss, which he attributes partially to the swallowing issue, and frequently gets choked.  He has a history of an Inspire device placement, which was done at  least ten years ago. He occasionally feels a tingling sensation, which he suspects may be related to the device, especially since the neck surgery was performed near the device's wiring.  He experiences significant shortness of breath, particularly when walking short distances, such as from one room to another. This has been ongoing for about a year. He used to jog regularly but has had to stop due to the shortness of breath. He has a history of heart problems and has stents placed. He is scheduled to see his cardiologist next week.  He was hospitalized with COVID-19 in the past. He also had a punctured lung years ago. No smoking history. He reports a lot of mucus production upon waking in the morning.   Chest CT from July has been reviewed with the patient and his wife.     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  Tiny right renal stone.  Aortic atherosclerosis. 11/08/2023 CT chest without contrast: Patchy consolidation and ground glass disease most evident at the right middle lobe and right lower lobes with additional areas of mild ground glass disease and scattered clustered nodularity.  Findings are suspicious for multifocal infection/pneumonia, imaging follow-up to resolution is recommended.  On independent review this is consistent with chronic aspiration.  Emphysema.  Review of Systems A 10 point review of systems was performed and it is as noted above otherwise negative.   Past  Medical History:  Diagnosis Date   Actinic keratosis 02/04/2021   right lateral neck   Anemia    Anxiety    Asymptomatic LV dysfunction    50-55% by echo 06/2016   BCC (basal cell carcinoma of skin) 08/05/2020   left neck infra auricular - nodular pattern - treated with [facility] 09/17/2020   Berger's disease    CAD (coronary artery disease) 06/2005   PCI  of LAD and RCA   Depression    Dilated aortic root    4cm at sinus of Valsalva   Hematuria    Hyperlipidemia    Hypothyroidism    Insomnia    Left ureteral stone    Orthostatic hypotension    negative tilt table   OSA (obstructive sleep apnea)    moderate with AHI 17/hr, uses CPAP nightly(01/15/19 - has Inspire implant)   PVC's (premature ventricular contractions) 07/07/2016   Renal disorder    PT IS SEEING DR. BETSEY- NEPHROLOGIST   Rosacea    Urticaria     Past Surgical History:  Procedure Laterality Date   ANTERIOR CERVICAL DECOMP/DISCECTOMY FUSION N/A 03/13/2023   Procedure: CERVICAL FOUR-FIVE, CERVICAL FIVE-SIX ANTERIOR CERVICAL DISCECTOMY/DECOMPRESSION FUSION;  Surgeon: Onetha Kuba, MD;  Location: [facility] OR;  Service: Neurosurgery;  Laterality: N/A;  3C   CARDIAC CATHETERIZATION  09/06/2013   [facility]   CARDIAC CATHETERIZATION     COLONOSCOPY WITH PROPOFOL  N/A 01/21/2019   Procedure: COLONOSCOPY WITH PROPOFOL ;  Surgeon: Jinny Carmine, MD;  Location: [facility];  Service: Endoscopy;  Laterality: N/A;  sleep apnea   COLONOSCOPY WITH PROPOFOL  N/A 10/19/2021   Procedure: COLONOSCOPY WITH PROPOFOL ;  Surgeon: Jinny Carmine, MD;  Location: [facility] ENDOSCOPY;  Service: Endoscopy;  Laterality: N/A;   CORONARY ANGIOPLASTY  2004   STENT PLACEMENT   CYSTOSCOPY WITH RETROGRADE PYELOGRAM, URETEROSCOPY AND STENT PLACEMENT Left 03/19/2014   Procedure: CYSTOSCOPY WITH RETROGRADE PYELOGRAM, URETEROSCOPY AND STENT PLACEMENT;  Surgeon: Mickey Ricardo KATHEE Alvaro, MD;  Location: [facility] ORS;  Service: Urology;  Laterality: Left;   CYSTOSCOPY WITH RETROGRADE PYELOGRAM, URETEROSCOPY AND STENT PLACEMENT Bilateral 05/20/2015   Procedure:  CYSTOSCOPY WITH BILATERAL RETROGRADE PYELOGRAM,RIGHT  DIAGNOSTIC URETEROSCOPY ,LEFT URETEROSCOPY WITH HOLMIUM LASER  AND BILATERAL  STENT PLACEMENT ;  Surgeon: Ricardo Alvaro, MD;  Location: [facility];  Service: Urology;  Laterality: Bilateral;   DRUG INDUCED ENDOSCOPY N/A 10/20/2017    Procedure: DRUG INDUCED SLEEP ENDOSCOPY;  Surgeon: Carlie Clark, MD;  Location: [facility];  Service: ENT;  Laterality: N/A;   EP IMPLANTABLE DEVICE N/A 05/13/2016   Procedure: Loop Recorder Insertion;  Surgeon: Elspeth JAYSON Sage, MD;  Location: [facility]VAS[facility];  Service: Cardiovascular;  Laterality: N/A;   ESOPHAGOGASTRODUODENOSCOPY N/A 10/19/2021   Procedure: ESOPHAGOGASTRODUODENOSCOPY (EGD);  Surgeon: Jinny Carmine, MD;  Location: [facility] ENDOSCOPY;  Service: Endoscopy;  Laterality: N/A;   ESOPHAGOGASTRODUODENOSCOPY (EGD) WITH PROPOFOL  N/A 11/30/2018   Procedure: ESOPHAGOGASTRODUODENOSCOPY (EGD) WITH PROPOFOL ;  Surgeon: Therisa Bi, MD;  Location: [facility] ENDOSCOPY;  Service: Gastroenterology;  Laterality: N/A;   ESOPHAGOGASTRODUODENOSCOPY (EGD) WITH PROPOFOL  N/A 12/01/2018   Procedure: ESOPHAGOGASTRODUODENOSCOPY (EGD) WITH PROPOFOL ;  Surgeon: Jinny Carmine, MD;  Location: [facility] ENDOSCOPY;  Service: Endoscopy;  Laterality: N/A;   HOLMIUM LASER APPLICATION Bilateral 05/20/2015   Procedure: HOLMIUM LASER APPLICATION;  Surgeon: Ricardo Alvaro, MD;  Location: [facility];  Service: Urology;  Laterality: Bilateral;   IMPLIMENTATION OF A HYPOGLOSSAL NERVE STIMULATOR  12/08/2017   OSA    LEFT HEART CATHETERIZATION WITH CORONARY ANGIOGRAM N/A 09/06/2013   Procedure:  LEFT HEART CATHETERIZATION WITH CORONARY ANGIOGRAM;  Surgeon: Victory LELON Claudene DOUGLAS, MD;  Location: [facility] CATH LAB;  Service: Cardiovascular;  Laterality: N/A;   LEFT KNEE CAP  SURGERY ABOUT 40 YRS AGO  ? 1970     STONE EXTRACTION WITH BASKET Left 03/19/2014   Procedure: STONE EXTRACTION WITH BASKET;  Surgeon: Mickey Ricardo KATHEE Alvaro, MD;  Location: [facility];  Service: Urology;  Laterality: Left;   TRANSURETHRAL RESECTION OF PROSTATE N/A 08/25/2021   Procedure: TRANSURETHRAL RESECTION OF THE PROSTATE (TURP);  Surgeon: Alvaro Ricardo, MD;  Location: [facility];  Service: Urology;  Laterality: N/A;  1 HR    Patient Active Problem List   Diagnosis Date Noted    Fall 01/03/2024   Hematoma of right hip 01/03/2024   Chronic pulmonary aspiration 12/29/2023   Silent aspiration 12/29/2023   Moderate protein-calorie malnutrition 12/29/2023   Bilateral leg weakness 12/29/2023   Chronic respiratory failure with hypoxia, on home oxygen  therapy (HCC) 12/29/2023   Chronic constipation 12/29/2023   Somnolence 12/29/2023   Frequent falls 12/29/2023   Feeding difficulties, unspecified 12/20/2023   Shortness of breath 12/20/2023   Abnormal CT of the chest 10/10/2023   Elevated blood sugar 06/30/2023   Carotid stenosis 05/11/2023   Spinal stenosis in cervical region 03/13/2023   Osteoarthritis of spine with radiculopathy, cervical region 07/14/2022   Cervicogenic headache 11/30/2021   Chronic neck pain 11/30/2021   Weight loss, non-intentional    Dysphagia    Polyp of colon    Prostatic hyperplasia 08/25/2021   Chronic insomnia 03/19/2020   Peripheral neuropathy 03/19/2020   Iron deficiency anemia 01/06/2020   Allergic dermatitis 07/08/2019   Osteoarthritis of left knee 02/26/2019   History of colonic polyps    History of duodenal ulcer    CKD (chronic kidney disease), stage IIIa    Bilateral chronic knee pain 11/09/2018   Epistaxis 10/02/2018   Subjective tinnitus, bilateral 10/02/2018   Chronic pain of right thumb 02/22/2018   Acute bilateral thoracic back pain 01/23/2018   Osteoporosis without current pathological fracture 02/10/2017   Hypothyroidism 11/02/2016   Thoracic compression fracture (HCC) 09/06/2016   PVC's (premature ventricular contractions) 07/07/2016   Hypoalbuminemia 06/28/2016   IgA nephropathy 06/07/2016   Acute buttock pain 05/23/2016   MDD (major depressive disorder), recurrent episode, moderate (HCC) 11/20/2015   OSA (obstructive sleep apnea) 02/05/2014   Orthostatic hypotension 02/07/2013   Scotoma involving central area 01/29/2013   Optic neuropathy 01/28/2013   HEMATURIA UNSPECIFIED 01/21/2010   HLD (hyperlipidemia)  07/05/2006   Generalized anxiety disorder 07/05/2006   CAD (coronary artery disease) 07/05/2006   GERD 07/05/2006    Family History  Problem Relation Age of Onset   Coronary artery disease Mother    Heart attack Mother    Heart disease Mother    Hypertension Mother    Cancer Father        lung and colon   Lung cancer Father        smoker   Diabetes Brother     Social History   Tobacco Use   Smoking status: Never   Smokeless tobacco: Never  Substance Use Topics   Alcohol use: Not Currently    Alcohol/week: 1.0 standard drink of alcohol    Types: 1 Cans of beer per week    Allergies  Allergen Reactions   Codeine Nausea Only and Nausea And Vomiting   Tape Other (See Comments)    Use only paper tape. Severe blistering and bruising with other  tapes. No cloth tape.    Current Meds  Medication Sig   aspirin  EC 81 MG tablet Take 81 mg by mouth daily.   atorvastatin  (LIPITOR ) 80 MG tablet TAKE 1 TABLET BY MOUTH DAILY   baclofen (LIORESAL) 10 MG tablet Take 10 mg by mouth 2 (two) times daily.   buPROPion  (WELLBUTRIN  XL) 150 MG 24 hr tablet TAKE 3 TABLETS BY MOUTH DAILY   busPIRone  (BUSPAR ) 10 MG tablet TAKE 2 TABLETS BY MOUTH 3 TIMES A DAY   Cholecalciferol  (VITAMIN D3) 50 MCG (2000 UT) TABS Take 2,000 Units by mouth daily.   clopidogrel  (PLAVIX ) 75 MG tablet TAKE 1 TABLET BY MOUTH DAILY   cyanocobalamin  (VITAMIN B12) 1000 MCG tablet Take 1,000 mcg by mouth daily.   denosumab  (PROLIA ) 60 MG/ML SOSY injection Inject 60 mg into the skin every 6 (six) months. Do not send RX, order this from the office   DULoxetine  (CYMBALTA ) 30 MG capsule TAKE THREE CAPSULES BY MOUTH DAILY   Erenumab-aooe (AIMOVIG) 140 MG/ML SOAJ 140 mg every 28 (twenty-eight) days.   finasteride  (PROSCAR ) 5 MG tablet Take 5 mg by mouth daily.   levothyroxine  (SYNTHROID ) 88 MCG tablet TAKE 1 TABLET BY MOUTH DAILY BEFORE BREAKFAST   Magnesium  250 MG CAPS Take 250 mg by mouth daily.   metoCLOPramide (REGLAN) 10  MG tablet Take 10 mg by mouth every 8 (eight) hours as needed.   metoprolol  succinate (TOPROL  XL) 25 MG 24 hr tablet Take 1 tablet (25 mg total) by mouth daily.   nitroGLYCERIN  (NITROSTAT ) 0.4 MG SL tablet Place 1 tablet (0.4 mg total) under the tongue every 5 (five) minutes as needed for chest pain.   zolpidem  (AMBIEN ) 5 MG tablet Take 1 tablet (5 mg total) by mouth at bedtime as needed.   zonisamide  (ZONEGRAN ) 25 MG capsule Take 1 capsule (25 mg total) by mouth daily.   [DISCONTINUED] losartan  (COZAAR ) 50 MG tablet Take 1 tablet (50 mg total) by mouth in the morning and at bedtime.   [DISCONTINUED] triamcinolone  cream (KENALOG ) 0.1 % APPLY TOPICALLY 2 TIMES A DAY AS NEEDED .DO NOT APPLY TO FACE, GROIN AND UNDERARMS   Current Facility-Administered Medications for the 12/20/23 encounter (Office Visit) with Tamea Dedra CROME, MD  Medication   denosumab  (PROLIA ) injection 60 mg   [START ON 06/30/2024] denosumab  (PROLIA ) injection 60 mg    Immunization History  Administered Date(s) Administered   Fluad Quad(high Dose 65+) 12/04/2018, 01/01/2020   INFLUENZA, HIGH DOSE SEASONAL PF 01/04/2021   Influenza Split 01/04/2011, 01/27/2012   Influenza Whole 01/09/2008, 03/04/2009, 12/27/2009   Influenza,inj,Quad PF,6+ Mos 01/31/2013, 01/31/2014, 02/03/2015, 02/09/2016, 01/20/2017, 01/23/2018   Influenza-Unspecified 12/08/2021, 12/14/2022   PFIZER Comirnaty(Gray Top)Covid-19 Tri-Sucrose Vaccine 05/04/2020   PFIZER(Purple Top)SARS-COV-2 Vaccination 06/01/2019, 06/25/2019   Pneumococcal Conjugate-13 02/09/2016   Pneumococcal Polysaccharide-23 02/23/2017   Td 06/05/2007   Tdap 03/13/2019   Zoster Recombinant(Shingrix) 10/13/2021, 12/16/2021   Zoster, Live 01/25/2011        Objective:     BP 122/88   Pulse 68   Temp 97.6 F (36.4 C) (Temporal)   Ht 6' (1.829 m)   Wt 122 lb (55.3 kg)   SpO2 100%   BMI 16.55 kg/m   SpO2: 100 %  GENERAL: Thin almost cachectic gentleman, no acute distress,  wet sounding voice with almost constant throat clearing. HEAD: Normocephalic, atraumatic.  EYES: Pupils equal, round, reactive to light.  No scleral icterus.  MOUTH: Oral mucosa moist. NECK: Limited range of motion. No thyromegaly. Trachea  midline. No JVD.  No adenopathy. PULMONARY: Good air entry bilaterally.  No adventitious sounds. CARDIOVASCULAR: S1 and S2. Regular rate and rhythm.  No rubs, murmurs or gallops heard. ABDOMEN: Scaphoid, otherwise benign. MUSCULOSKELETAL: No joint deformity, no clubbing, no edema.  Significant muscle wasting noted. NEUROLOGIC: Swallowing appears impaired.  No overt gait disturbance. SKIN: Intact,warm,dry. PSYCH: Mood and behavior normal.  Representative images from CT performed 08 November 2023 showing round glass opacities in the right lung (right middle right lower lobe):        Lab Results  Component Value Date   NITRICOXIDE 5 12/20/2023  *No evidence of type II inflammation noted.  Ambulatory oxymetry was performed today:  At rest on room air oxygen  saturation was 100%, the patient ambulated at a normal pace, completed 3 laps, O2 nadir 100%, mild shortness of breath.  Resting heart rate was 65 bpm at maximum for this exercise 82 bpm. There is no desaturation of oxygen  with exercise.  Heart rate response appears blunted.  Cardiac versus deconditioning issues may impact on sensation of dyspnea.   Assessment & Plan:     ICD-10-CM   1. Shortness of breath  R06.02 Nitric oxide     Pulmonary function test    2. Abnormal CT of the chest  R93.89 CT CHEST WO CONTRAST    3. Oropharyngeal dysphagia  R13.12 DG SWALLOW FUNC OP MEDICARE SPEECH PATH    4. Spinal stenosis in cervical region  M48.02 DG SWALLOW FUNC OP MEDICARE SPEECH PATH    5. Feeding difficulties, unspecified  R63.30 DG SWALLOW FUNC OP MEDICARE SPEECH PATH    6. OSA (obstructive sleep apnea)  G47.33    Inspire device has not been checked in several years      Orders Placed This  Encounter  Procedures   CT CHEST WO CONTRAST    Standing Status:   Future    Expected Date:   02/08/2024    Expiration Date:   12/19/2024    Preferred imaging location?:   [facility]   DG SWALLOW FUNC OP MEDICARE SPEECH PATH    Standing Status:   Future    Number of Occurrences:   1    Expiration Date:   12/19/2024    Reason for Exam (SYMPTOM  OR DIAGNOSIS REQUIRED):   Dysphagia causing aspiration    Where should this test be performed?:   [facility])   Nitric oxide    Pulmonary function test    Standing Status:   Future    Expiration Date:   12/19/2024    Where should this test be performed?:   Outpatient Pulmonary    What type of PFT is being ordered?:   Full PFT   Discussion:    Right lung predominant infiltrates and scarring with suspected aspiration and dysphagia CT scan reveals infiltrates and scarring predominantly in the right lung, likely due to aspiration. Dysphagia with difficulty swallowing pills and frequent choking, onset post neck surgery 2-3 months ago. Weight loss likely related to swallowing difficulties. Prior pneumonia and COVID-19 hospitalization may have contributed to lung scarring. - Order swallow study to evaluate dysphagia - Consult speech pathologist for swallow evaluation and management - Repeat chest CT in October to monitor lung changes  Shortness of breath Shortness of breath on exertion for about a year. Coronary artery stents present. Oxygen  saturation remains at 100% during walking test. Possible cardiac contribution to symptoms. - Order breathing tests to assess lung function - Coordinate with cardiologist, Dr. Gollan, regarding potential  echocardiogram  Obstructive sleep apnea with Inspire device Inspire device placed over ten years ago. Concerns about device function post neck surgery, with occasional tingling sensation. - Refer to sleep specialist for Inspire device management - Coordinate with Inspire team for device evaluation       Advised if symptoms do not improve or worsen, to please contact office for sooner follow up or seek emergency care.    I spent 60 minutes of dedicated to the care of this patient on the date of this encounter to include pre-visit review of records, face-to-face time with the patient discussing conditions above, post visit ordering of testing, clinical documentation with the electronic health record, making appropriate referrals as documented, and communicating necessary findings to members of the patients care team.   C. Leita Sanders, MD Advanced Bronchoscopy PCCM [facility]-    *This note was dictated using voice recognition software/Dragon.  Despite best efforts to proofread, errors can occur which can change the meaning. Any transcriptional errors that result from this process are unintentional and may not be fully corrected at the time of dictation.

## 2023-12-22 ENCOUNTER — Ambulatory Visit

## 2023-12-22 NOTE — Progress Notes (Signed)
 Cardiology Office Note  Date:  12/25/2023   ID:  Shawn Lam, DOB 10-03-1950, MRN [id number]  PCP:  Avelina Greig BRAVO, MD   Chief Complaint  Patient presents with   3 month follow up     Patient c/o dizziness & shortness of breath with little to no activity.     HPI:  Shawn Lam is a 73 y.o. male with a hx of  CAD s/p PCI of the LAD and RCA,  orthostatic hypotension,  dyslipidemia EF of 50-55% with inferior HK.  lymphedema  mildly dilated aortic root at 38 mm by echo 2018,  moderate OSA, inspire device 12/08/2017 Chronic renal insufficiency creatinine 1.74 emphysema Who presents for routine follow-up of his coronary artery disease, orthostatic hypotension  Last seen by myself 3/24 evaluated in [facility] 08/28/2023 status post mechanical fall.  Last seen by one of our providers June 2025  Weight down 6 pounds since that visit Weight was 140 in may 2025, now 122 pounds Not eating well Some orthostasisis symptoms  Off midodrine  On losartan  50 twice daily, metoprolol  succinate 25 daily  When waking, mucus in throat Feels his shortness of breath is getting worse has been going on for several months  Seen by pulmonary Scheduled for swallow study tomorrow, concern for aspiration  Imaging reviewed Lung CT:  patchy consolidation and ground-glass disease most evident at the right middle lobe and right lower lobes with additional areas of mild ground-glass disease and scattered clustered nodularity, findings are suspicious for multifocal infection/pneumonia.  Denies significant coughing  EKG personally reviewed by myself on todays visit EKG Interpretation Date/Time:  Monday December 25 2023 14:19:46 EDT Ventricular Rate:  58 PR Interval:  92 QRS Duration:  82 QT Interval:  418 QTC Calculation: 410 R Axis:   68  Text Interpretation: Sinus bradycardia with short PR When compared with ECG of 09-Aug-2023 13:49, PR interval has increased Confirmed by Perla Lye [phone])  on 12/25/2023 2:51:13 PM    Echo May 2023 EF 50%, no significant valvular heart disease Stress test performed June 2023, low risk study  Fall March 23, 2022 Mechanical fall, hit his head  Seen in the emergency room April 25, 2022,  Fall, notes detailing that he slipped on soap in the shower, fell backwards on right side hit head and neck against shower seat, possible brief loss of consciousness  Seen by neurology for his dizzy spells, started on primidone  100 mg daily Having bad neck pain, seen by orthopedics Continues midodrine  10 mg 3 times daily  other past medical history reviewed Last seen in clinic July 16, 2019 COVID September 2021 Seen in the emergency room February 07, 2020 for chest pain Diagnosed with bronchitis, chronic respiratory failure with hypoxia  prior fall, fractured his left forearm  Echocardiogram with ejection fraction 60 to 65% in August 2020  In the hospital 11/2018 GI bleeding and chest pain vasovagal episode 2 days ago in the setting of GI bleed and chronic hypotension EGD with no complications  Clean-based ulcer no signs of clot or bleeding continue Florinef  and midodrine  at outpatient   Previous trial of CPAP but was intolerant and then was referred to ENT for consideration of inspire device.  On 12/08/2017 he underwent placement of inspire device without complication.     PMH:   has a past medical history of Actinic keratosis (02/04/2021), Anemia, Anxiety, Asymptomatic LV dysfunction, BCC (basal cell carcinoma of skin) (08/05/2020), Berger's disease, CAD (coronary artery disease) (06/2005), Depression, Dilated aortic root (  HCC), Hematuria, Hyperlipidemia, Hypothyroidism, Insomnia, Left ureteral stone, Orthostatic hypotension, OSA (obstructive sleep apnea), PVC's (premature ventricular contractions) (07/07/2016), Renal disorder, Rosacea, and Urticaria.  PSH:    Past Surgical History:  Procedure Laterality Date   ANTERIOR CERVICAL  DECOMP/DISCECTOMY FUSION N/A 03/13/2023   Procedure: CERVICAL FOUR-FIVE, CERVICAL FIVE-SIX ANTERIOR CERVICAL DISCECTOMY/DECOMPRESSION FUSION;  Surgeon: Onetha Kuba, MD;  Location: [facility] OR;  Service: Neurosurgery;  Laterality: N/A;  3C   CARDIAC CATHETERIZATION  09/06/2013   [facility]   CARDIAC CATHETERIZATION     COLONOSCOPY WITH PROPOFOL  N/A 01/21/2019   Procedure: COLONOSCOPY WITH PROPOFOL ;  Surgeon: Jinny Carmine, MD;  Location: [facility];  Service: Endoscopy;  Laterality: N/A;  sleep apnea   COLONOSCOPY WITH PROPOFOL  N/A 10/19/2021   Procedure: COLONOSCOPY WITH PROPOFOL ;  Surgeon: Jinny Carmine, MD;  Location: [facility] ENDOSCOPY;  Service: Endoscopy;  Laterality: N/A;   CORONARY ANGIOPLASTY  2004   STENT PLACEMENT   CYSTOSCOPY WITH RETROGRADE PYELOGRAM, URETEROSCOPY AND STENT PLACEMENT Left 03/19/2014   Procedure: CYSTOSCOPY WITH RETROGRADE PYELOGRAM, URETEROSCOPY AND STENT PLACEMENT;  Surgeon: Mickey Ricardo KATHEE Alvaro, MD;  Location: [facility];  Service: Urology;  Laterality: Left;   CYSTOSCOPY WITH RETROGRADE PYELOGRAM, URETEROSCOPY AND STENT PLACEMENT Bilateral 05/20/2015   Procedure:  CYSTOSCOPY WITH BILATERAL RETROGRADE PYELOGRAM,RIGHT  DIAGNOSTIC URETEROSCOPY ,LEFT URETEROSCOPY WITH HOLMIUM LASER  AND BILATERAL  STENT PLACEMENT ;  Surgeon: Ricardo Alvaro, MD;  Location: [facility];  Service: Urology;  Laterality: Bilateral;   DRUG INDUCED ENDOSCOPY N/A 10/20/2017   Procedure: DRUG INDUCED SLEEP ENDOSCOPY;  Surgeon: Carlie Clark, MD;  Location: [facility];  Service: ENT;  Laterality: N/A;   EP IMPLANTABLE DEVICE N/A 05/13/2016   Procedure: Loop Recorder Insertion;  Surgeon: Elspeth JAYSON Sage, MD;  Location: [facility];  Service: Cardiovascular;  Laterality: N/A;   ESOPHAGOGASTRODUODENOSCOPY N/A 10/19/2021   Procedure: ESOPHAGOGASTRODUODENOSCOPY (EGD);  Surgeon: Jinny Carmine, MD;  Location: [facility] END[facility];  Service: Endoscopy;  Laterality: N/A;   ESOPHAGOGASTRODUODENOSCOPY (EGD) WITH PROPOFOL   N/A 11/30/2018   Procedure: ESOPHAGOGASTRODUODENOSCOPY (EGD) WITH PROPOFOL ;  Surgeon: Therisa Bi, MD;  Location: [facility] ENDOSCOPY;  Service: Gastroenterology;  Laterality: N/A;   ESOPHAGOGASTRODUODENOSCOPY (EGD) WITH PROPOFOL  N/A 12/01/2018   Procedure: ESOPHAGOGASTRODUODENOSCOPY (EGD) WITH PROPOFOL ;  Surgeon: Jinny Carmine, MD;  Location: [facility] ENDOSCOPY;  Service: Endoscopy;  Laterality: N/A;   HOLMIUM LASER APPLICATION Bilateral 05/20/2015   Procedure: HOLMIUM LASER APPLICATION;  Surgeon: Ricardo Alvaro, MD;  Location: [facility];  Service: Urology;  Laterality: Bilateral;   IMPLIMENTATION OF A HYPOGLOSSAL NERVE STIMULATOR  12/08/2017   OSA    LEFT HEART CATHETERIZATION WITH CORONARY ANGIOGRAM N/A 09/06/2013   Procedure: LEFT HEART CATHETERIZATION WITH CORONARY ANGIOGRAM;  Surgeon: Victory LELON Claudene DOUGLAS, MD;  Location: [facility];  Service: Cardiovascular;  Laterality: N/A;   LEFT KNEE CAP  SURGERY ABOUT 40 YRS AGO  ? 1970     STONE EXTRACTION WITH BASKET Left 03/19/2014   Procedure: STONE EXTRACTION WITH BASKET;  Surgeon: Mickey Ricardo KATHEE Alvaro, MD;  Location: [facility];  Service: Urology;  Laterality: Left;   TRANSURETHRAL RESECTION OF PROSTATE N/A 08/25/2021   Procedure: TRANSURETHRAL RESECTION OF THE PROSTATE (TURP);  Surgeon: Alvaro Ricardo, MD;  Location: [facility];  Service: Urology;  Laterality: N/A;  1 HR    Current Outpatient Medications  Medication Sig Dispense Refill   aspirin  EC 81 MG tablet Take 81 mg by mouth daily.     atorvastatin  (LIPITOR ) 80 MG tablet TAKE 1 TABLET BY MOUTH DAILY 90 tablet  3   baclofen (LIORESAL) 10 MG tablet Take 10 mg by mouth 2 (two) times daily.     buPROPion  (WELLBUTRIN  XL) 150 MG 24 hr tablet TAKE 3 TABLETS BY MOUTH DAILY 90 tablet 1   busPIRone  (BUSPAR ) 10 MG tablet TAKE 2 TABLETS BY MOUTH 3 TIMES A DAY 180 tablet 5   Cholecalciferol  (VITAMIN D3) 50 MCG (2000 UT) TABS Take 2,000 Units by mouth daily.     clopidogrel  (PLAVIX ) 75 MG tablet TAKE 1 TABLET BY MOUTH DAILY 90  tablet 3   cyanocobalamin  (VITAMIN B12) 1000 MCG tablet Take 1,000 mcg by mouth daily.     denosumab  (PROLIA ) 60 MG/ML SOSY injection Inject 60 mg into the skin every 6 (six) months. Do not send RX, order this from the office     DULoxetine  (CYMBALTA ) 30 MG capsule TAKE THREE CAPSULES BY MOUTH DAILY 90 capsule 5   Erenumab-aooe (AIMOVIG) 140 MG/ML SOAJ 140 mg every 28 (twenty-eight) days.     finasteride  (PROSCAR ) 5 MG tablet Take 5 mg by mouth daily.     levothyroxine  (SYNTHROID ) 88 MCG tablet TAKE 1 TABLET BY MOUTH DAILY BEFORE BREAKFAST 90 tablet 3   losartan  (COZAAR ) 50 MG tablet Take 1 tablet (50 mg total) by mouth in the morning and at bedtime. 180 tablet 3   Magnesium  250 MG CAPS Take 250 mg by mouth daily.     metoCLOPramide (REGLAN) 10 MG tablet Take 10 mg by mouth every 8 (eight) hours as needed.     metoprolol  succinate (TOPROL  XL) 25 MG 24 hr tablet Take 1 tablet (25 mg total) by mouth daily. 90 tablet 3   nitroGLYCERIN  (NITROSTAT ) 0.4 MG SL tablet Place 1 tablet (0.4 mg total) under the tongue every 5 (five) minutes as needed for chest pain. 10 tablet 1   tiZANidine (ZANAFLEX) 4 MG tablet Take 4 mg by mouth 2 (two) times daily.     triamcinolone  cream (KENALOG ) 0.1 % APPLY TOPICALLY 2 TIMES A DAY AS NEEDED .DO NOT APPLY TO FACE, GROIN AND UNDERARMS 454 g 0   zolpidem  (AMBIEN ) 5 MG tablet Take 1 tablet (5 mg total) by mouth at bedtime as needed. 30 tablet 0   zonisamide  (ZONEGRAN ) 25 MG capsule Take 1 capsule (25 mg total) by mouth daily. 4 capsule 0   gabapentin  (NEURONTIN ) 100 MG capsule Take 1 capsule (100 mg total) by mouth 4 (four) times daily. (Patient not taking: Reported on 12/25/2023) 120 capsule 1   Current Facility-Administered Medications  Medication Dose Route Frequency Provider Last Rate Last Admin   denosumab  (PROLIA ) injection 60 mg  60 mg Subcutaneous Once Bedsole, Amy E, MD       [START ON 12/28/2023] denosumab  (PROLIA ) injection 60 mg  60 mg Subcutaneous Once  Bedsole, Amy E, MD         Allergies:   Codeine and Tape   Social History:  The patient  reports that he has never smoked. He has never used smokeless tobacco. He reports that he does not currently use alcohol after a past usage of about 1.0 standard drink of alcohol per week. He reports that he does not use drugs.   Family History:   family history includes Cancer in his father; Coronary artery disease in his mother; Diabetes in his brother; Heart attack in his mother; Heart disease in his mother; Hypertension in his mother; Lung cancer in his father.    Review of Systems: Review of Systems  Constitutional: Negative.   HENT:  Negative.    Respiratory:  Positive for shortness of breath.   Cardiovascular: Negative.   Gastrointestinal: Negative.   Musculoskeletal: Negative.   Neurological: Negative.   Psychiatric/Behavioral: Negative.    All other systems reviewed and are negative.   PHYSICAL EXAM: VS:  BP 120/60 (BP Location: Left Arm, Patient Position: Sitting, Cuff Size: Normal)   Pulse (!) 58   Ht 6' (1.829 m)   Wt 122 lb 4 oz (55.5 kg)   SpO2 97%   BMI 16.58 kg/m  , BMI Body mass index is 16.58 kg/m. Constitutional:  oriented to person, place, and time. No distress.  HENT:  Head: Grossly normal Eyes:  no discharge. No scleral icterus.  Neck: No JVD, no carotid bruits  Cardiovascular: Regular rate and rhythm, no murmurs appreciated Pulmonary/Chest: Clear to auscultation bilaterally, no wheezes.  Scattered Rales Abdominal: Soft.  no distension.  no tenderness.  Musculoskeletal: Normal range of motion Neurological:  normal muscle tone. Coordination normal. No atrophy Skin: Skin warm and dry Psychiatric: normal affect, pleasant  Recent Labs: 07/12/2023: BUN 18; Creatinine, Ser 1.15; Potassium 4.4; Sodium 139; TSH 1.25 08/15/2023: ALT 43; Hemoglobin 12.4; Platelets 235.0    Lipid Panel Lab Results  Component Value Date   CHOL 147 07/12/2023   HDL 73.20 07/12/2023    LDLCALC 57 07/12/2023   TRIG 84.0 07/12/2023      Wt Readings from Last 3 Encounters:  12/25/23 122 lb 4 oz (55.5 kg)  12/20/23 122 lb (55.3 kg)  12/15/23 120 lb 2 oz (54.5 kg)     ASSESSMENT AND PLAN:  Problem List Items Addressed This Visit       Cardiology Problems   HLD (hyperlipidemia) (Chronic)   CAD (coronary artery disease) - Primary (Chronic)   Relevant Orders   EKG 12-Lead (Completed)   Carotid stenosis   Relevant Orders   EKG 12-Lead (Completed)   PVC's (premature ventricular contractions)   Relevant Orders   EKG 12-Lead (Completed)     Other   CKD (chronic kidney disease), stage IIIa (Chronic)   OSA (obstructive sleep apnea) (Chronic)   Other Visit Diagnoses       Primary hypertension       Relevant Orders   EKG 12-Lead (Completed)     PAC (premature atrial contraction)       Relevant Orders   EKG 12-Lead (Completed)     PAD (peripheral artery disease) (HCC)            1.  OSA -   intolerant to CPAP therapy  inspire device implanted 2019, reports that is working well   2. CAD -  with stable angina s/p remote PCI of the LAD and RCA.   Denies chest pain but does have worsening shortness of breath Echocardiogram ordered for initial evaluation, may need Myoview  and follow-up   3.  Orthostatic hypotension  Previously on midodrine  3 times daily As his weight improved up to 140 pounds, midodrine  was weaned off Had been taking losartan  50 twice daily with metoprolol  succinate 25 daily Weight back down to 122 on today's visit, reports he is not hungry With that has mild orthostasis symptoms, blood pressure dropping from 130 systolic down to 889 after standing 3 minutes with mild symptoms Recommend he decrease losartan  down to 50 once a day from twice daily, stay on metoprolol  succinate 25  4.  Hyperlipidemia - Continue Lipitor  80 daily  5.  Palpitations Symptoms stable  6. COVID-19 pneumonia  infection in September 2021, full  recovery  7.   Falls Prior falls possibly exacerbated by orthostasis, leg weakness Of concern is the weight drop down from 140 in May 2025 now 122    Signed, Velinda Lunger, M.D., Ph.D. [facility] [place], [place] [phone]

## 2023-12-25 ENCOUNTER — Ambulatory Visit: Attending: Cardiovascular Disease | Admitting: Cardiovascular Disease

## 2023-12-25 ENCOUNTER — Encounter: Payer: Self-pay | Admitting: Cardiovascular Disease

## 2023-12-25 ENCOUNTER — Ambulatory Visit: Admitting: Physical Therapy

## 2023-12-25 VITALS — BP 120/60 | HR 58 | Ht 72.0 in | Wt 122.2 lb

## 2023-12-25 DIAGNOSIS — M546 Pain in thoracic spine: Secondary | ICD-10-CM | POA: Diagnosis not present

## 2023-12-25 DIAGNOSIS — I739 Peripheral vascular disease, unspecified: Secondary | ICD-10-CM | POA: Insufficient documentation

## 2023-12-25 DIAGNOSIS — I6521 Occlusion and stenosis of right carotid artery: Secondary | ICD-10-CM | POA: Diagnosis not present

## 2023-12-25 DIAGNOSIS — I25118 Atherosclerotic heart disease of native coronary artery with other forms of angina pectoris: Secondary | ICD-10-CM | POA: Insufficient documentation

## 2023-12-25 DIAGNOSIS — R1312 Dysphagia, oropharyngeal phase: Secondary | ICD-10-CM | POA: Diagnosis not present

## 2023-12-25 DIAGNOSIS — E782 Mixed hyperlipidemia: Secondary | ICD-10-CM | POA: Diagnosis not present

## 2023-12-25 DIAGNOSIS — N183 Chronic kidney disease, stage 3 unspecified: Secondary | ICD-10-CM | POA: Diagnosis not present

## 2023-12-25 DIAGNOSIS — I491 Atrial premature depolarization: Secondary | ICD-10-CM | POA: Diagnosis not present

## 2023-12-25 DIAGNOSIS — M542 Cervicalgia: Secondary | ICD-10-CM

## 2023-12-25 DIAGNOSIS — I10 Essential (primary) hypertension: Secondary | ICD-10-CM | POA: Diagnosis not present

## 2023-12-25 DIAGNOSIS — I493 Ventricular premature depolarization: Secondary | ICD-10-CM | POA: Diagnosis not present

## 2023-12-25 DIAGNOSIS — G4733 Obstructive sleep apnea (adult) (pediatric): Secondary | ICD-10-CM | POA: Diagnosis not present

## 2023-12-25 MED ORDER — LOSARTAN POTASSIUM 50 MG PO TABS: 50.0000 mg | ORAL_TABLET | Freq: Every day | ORAL | 3 refills | Status: DC

## 2023-12-25 NOTE — Therapy (Signed)
 OUTPATIENT PHYSICAL THERAPY CERVICAL TREATMENT     Patient Name: Shawn Lam MRN: [id number] DOB:[birth year], 73 y.o., male Today's Date: 12/25/2023  END OF SESSION:  PT End of Session - 12/25/23 1419     Visit Number 2    Number of Visits 24    Date for PT Re-Evaluation 03/13/24    Authorization Type Medicare 2025    Authorization - Visit Number 2    Authorization - Number of Visits 24    Progress Note Due on Visit 10    PT Start Time 1300    PT Stop Time 1345    PT Time Calculation (min) 45 min    Activity Tolerance Patient tolerated treatment well    Behavior During Therapy [facility] for tasks assessed/performed           Past Medical History:  Diagnosis Date   Actinic keratosis 02/04/2021   right lateral neck   Anemia    Anxiety    Asymptomatic LV dysfunction    50-55% by echo 06/2016   BCC (basal cell carcinoma of skin) 08/05/2020   left neck infra auricular - nodular pattern - treated with [facility] 09/17/2020   Berger's disease    CAD (coronary artery disease) 06/2005   PCI of LAD and RCA   Depression    Dilated aortic root (HCC)    4cm at sinus of Valsalva   Hematuria    Hyperlipidemia    Hypothyroidism    Insomnia    Left ureteral stone    Orthostatic hypotension    negative tilt table   OSA (obstructive sleep apnea)    moderate with AHI 17/hr, uses CPAP nightly(01/15/19 - has Inspire implant)   PVC's (premature ventricular contractions) 07/07/2016   Renal disorder    PT IS SEEING DR. BETSEY- NEPHROLOGIST   Rosacea    Urticaria    Past Surgical History:  Procedure Laterality Date   ANTERIOR CERVICAL DECOMP/DISCECTOMY FUSION N/A 03/13/2023   Procedure: CERVICAL FOUR-FIVE, CERVICAL FIVE-SIX ANTERIOR CERVICAL DISCECTOMY/DECOMPRESSION FUSION;  Surgeon: Onetha Kuba, MD;  Location: [facility] OR;  Service: Neurosurgery;  Laterality: N/A;  3C   CARDIAC CATHETERIZATION  09/06/2013   [facility]   CARDIAC CATHETERIZATION     COLONOSCOPY WITH PROPOFOL  N/A 01/21/2019   Procedure:  COLONOSCOPY WITH PROPOFOL ;  Surgeon: Jinny Carmine, MD;  Location: [facility];  Service: Endoscopy;  Laterality: N/A;  sleep apnea   COLONOSCOPY WITH PROPOFOL  N/A 10/19/2021   Procedure: COLONOSCOPY WITH PROPOFOL ;  Surgeon: Jinny Carmine, MD;  Location: [facility] ENDOSCOPY;  Service: Endoscopy;  Laterality: N/A;   CORONARY ANGIOPLASTY  2004   STENT PLACEMENT   CYSTOSCOPY WITH RETROGRADE PYELOGRAM, URETEROSCOPY AND STENT PLACEMENT Left 03/19/2014   Procedure: CYSTOSCOPY WITH RETROGRADE PYELOGRAM, URETEROSCOPY AND STENT PLACEMENT;  Surgeon: Mickey Ricardo KATHEE Alvaro, MD;  Location: [facility] ORS;  Service: Urology;  Laterality: Left;   CYSTOSCOPY WITH RETROGRADE PYELOGRAM, URETEROSCOPY AND STENT PLACEMENT Bilateral 05/20/2015   Procedure:  CYSTOSCOPY WITH BILATERAL RETROGRADE PYELOGRAM,RIGHT  DIAGNOSTIC URETEROSCOPY ,LEFT URETEROSCOPY WITH HOLMIUM LASER  AND BILATERAL  STENT PLACEMENT ;  Surgeon: Ricardo Alvaro, MD;  Location: [facility] ORS;  Service: Urology;  Laterality: Bilateral;   DRUG INDUCED ENDOSCOPY N/A 10/20/2017   Procedure: DRUG INDUCED SLEEP ENDOSCOPY;  Surgeon: Carlie Clark, MD;  Location: [facility];  Service: ENT;  Laterality: N/A;   EP IMPLANTABLE DEVICE N/A 05/13/2016   Procedure: Loop Recorder Insertion;  Surgeon: Elspeth JAYSON Sage, MD;  Location: [facility] INVASIVE CV LAB;  Service: Cardiovascular;  Laterality: N/A;   ESOPHAGOGASTRODUODENOSCOPY N/A 10/19/2021   Procedure: ESOPHAGOGASTRODUODENOSCOPY (EGD);  Surgeon: Jinny Carmine, MD;  Location: [facility] ENDOSCOPY;  Service: Endoscopy;  Laterality: N/A;   ESOPHAGOGASTRODUODENOSCOPY (EGD) WITH PROPOFOL  N/A 11/30/2018   Procedure: ESOPHAGOGASTRODUODENOSCOPY (EGD) WITH PROPOFOL ;  Surgeon: Therisa Bi, MD;  Location: [facility] ENDOSCOPY;  Service: Gastroenterology;  Laterality: N/A;   ESOPHAGOGASTRODUODENOSCOPY (EGD) WITH PROPOFOL  N/A 12/01/2018   Procedure: ESOPHAGOGASTRODUODENOSCOPY (EGD) WITH PROPOFOL ;  Surgeon: Jinny Carmine, MD;  Location: [facility] ENDOSCOPY;   Service: Endoscopy;  Laterality: N/A;   HOLMIUM LASER APPLICATION Bilateral 05/20/2015   Procedure: HOLMIUM LASER APPLICATION;  Surgeon: Ricardo Likens, MD;  Location: [facility]S;  Service: Urology;  Laterality: Bilateral;   IMPLIMENTATION OF A HYPOGLOSSAL NERVE STIMULATOR  12/08/2017   OSA    LEFT HEART CATHETERIZATION WITH CORONARY ANGIOGRAM N/A 09/06/2013   Procedure: LEFT HEART CATHETERIZATION WITH CORONARY ANGIOGRAM;  Surgeon: Victory LELON Claudene DOUGLAS, MD;  Location: [facility] CATH LAB;  Service: Cardiovascular;  Laterality: N/A;   LEFT KNEE CAP  SURGERY ABOUT 40 YRS AGO  ? 1970     STONE EXTRACTION WITH BASKET Left 03/19/2014   Procedure: STONE EXTRACTION WITH BASKET;  Surgeon: Mickey Ricardo KATHEE Likens, MD;  Location: [facility] ORS;  Service: Urology;  Laterality: Left;   TRANSURETHRAL RESECTION OF PROSTATE N/A 08/25/2021   Procedure: TRANSURETHRAL RESECTION OF THE PROSTATE (TURP);  Surgeon: Likens Ricardo, MD;  Location: [facility] ORS;  Service: Urology;  Laterality: N/A;  1 HR   Patient Active Problem List   Diagnosis Date Noted   Feeding difficulties, unspecified 12/20/2023   Shortness of breath 12/20/2023   Abnormal CT of the chest 10/10/2023   Elevated blood sugar 06/30/2023   Carotid stenosis 05/11/2023   Spinal stenosis in cervical region 03/13/2023   Osteoarthritis of spine with radiculopathy, cervical region 07/14/2022   Cervicogenic headache 11/30/2021   Chronic neck pain 11/30/2021   Dysphagia    Polyp of colon    Prostatic hyperplasia 08/25/2021   Chronic insomnia 03/19/2020   Peripheral neuropathy 03/19/2020   Iron deficiency anemia 01/06/2020   Allergic dermatitis 07/08/2019   Osteoarthritis of left knee 02/26/2019   History of colonic polyps    History of duodenal ulcer    CKD (chronic kidney disease), stage IIIa    Bilateral chronic knee pain 11/09/2018   Epistaxis 10/02/2018   Subjective tinnitus, bilateral 10/02/2018   Chronic pain of right thumb 02/22/2018   Acute bilateral thoracic back pain  01/23/2018   Osteoporosis without current pathological fracture 02/10/2017   Hypothyroidism 11/02/2016   Thoracic compression fracture (HCC) 09/06/2016   PVC's (premature ventricular contractions) 07/07/2016   Hypoalbuminemia 06/28/2016   IgA nephropathy 06/07/2016   MDD (major depressive disorder), recurrent episode, moderate (HCC) 11/20/2015   OSA (obstructive sleep apnea) 02/05/2014   Orthostatic hypotension 02/07/2013   Scotoma involving central area 01/29/2013   Optic neuropathy 01/28/2013   HEMATURIA UNSPECIFIED 01/21/2010   HLD (hyperlipidemia) 07/05/2006   Generalized anxiety disorder 07/05/2006   CAD (coronary artery disease) 07/05/2006   GERD 07/05/2006    PCP: Dr. Greig Ring     REFERRING PROVIDER: Dr. Arley Helling    REFERRING DIAG: M54.6 (ICD-10-CM) - Acute bilateral thoracic back pain   THERAPY DIAG:  Cervicalgia  Pain in thoracic spine  Rationale for Evaluation and Treatment: Rehabilitation  ONSET DATE: May 2025    SUBJECTIVE:  SUBJECTIVE STATEMENT: Pt reports that his neck and periscapular pain feel about the same from time and that he still continues to feel a slight burn in his shoulder blades. Pt reports experiencing ongoing dizziness especially when standing from a seated position and he is experiencing shortness of breath when walking short distances that has been going on for 8 months now. He is following up with cardiologist for further eval and treatment.  Hand dominance: Right  PERTINENT HISTORY:  Pt reports being referred to physical therapy for ongoing neck pain following cervical fusion in May 2020 and newly developed thoracic pain from T12 compression fracture that occred sometime in spring of 2025 and has radicular pain patterns. He has since has pain  localized to upper traps and suboccipitals and that is non-radiating. He discontinued his prednisone  and gabapentin  two weeks ago and he was unable barely able to walk for two weeks. He describes feeling increased fatigue and weakness to the point where he struggled to walk. Pt also reported repeated falls in the setting of orthostatic hypotension with him experiencing syncope when he stands up from a seated position. He describes feeling a burning sensation that is also localized to his scapulae and that it occurs when he is doing UE activities like washing his car that is likely the radicular pain from thoracic vertebrae compression fracture. PAIN:  Are you having pain? Yes: NPRS scale: 4-5/10 NRPS, 10/10 (worst-when doing UE activities)  Pain location: Cervical Paraspinal  Pain description: Burning sensation   Aggravating factors: Strenuous activities like washing his car. Relieving factors: Sitting down and thoracic retraction  PRECAUTIONS: None  RED FLAGS: Cervical red flags: Dysphagia Yes: followed by pulmonologist and Dysarthria      WEIGHT BEARING RESTRICTIONS: No  FALLS:  Has patient fallen in last 6 months? Yes. Number of falls 6, pt reports that falls are happening in the context of syncope when standing up from a seated position (Orthostatic Hypotension) , where he blacks out and falls. He is following up with his PCP, Dr. Karlene who is working with him on stabilizing his blood pressure and she has recently taken him off Gabapentin  to see if this helps. Pt reports that he does note an improvement in his symptoms with less light headedness since going off medication.   LIVING ENVIRONMENT: Lives with: lives with their spouse Lives in: House/apartment Stairs: No Has following equipment at home: Single point cane and Environmental consultant - 4 wheeled  OCCUPATION: Retired     PLOF: Independent  PATIENT GOALS: Relief of his neck pain while performing upper extremity activities.    NEXT MD  VISIT: October 2025     OBJECTIVE:  Note: Objective measures were completed at Evaluation unless otherwise noted.  VITALS  109/72  hr 72 SpO2 100  DIAGNOSTIC FINDINGS:  CLINICAL DATA:  Back pain with occasional radicular symptoms.   EXAM: MRI THORACIC SPINE WITHOUT CONTRAST   TECHNIQUE: Multiplanar, multisequence MR imaging of the thoracic spine was performed. No intravenous contrast was administered.   COMPARISON:  MRI of the thoracic spine dated April 17, 2017.   FINDINGS: Alignment:  Physiologic.   Vertebrae: Chronic compression deformities of T7, T8, T9 and T12. There is retropulsion of the posterosuperior corner of T12 again demonstrated, resulting in mild-to-moderate central spinal canal stenosis, but no spinal cord impingement.   Cord:  Normal in morphology and signal intensity.   Paraspinal and other soft tissues: There are patchy, streaky opacities present in the posterior periphery of the lungs bilaterally  the immediate paraspinal soft tissues are unremarkable.   Disc levels:   There is minimal posterior disc bulging present at T7-8 common T9-10 and T10-11, causing minimal central spinal canal stenosis at each level. There is mild-to-moderate central spinal canal stenosis at T12 secondary to retropulsion of the superior posterior corner of the vertebral body. Otherwise, on the spinal canal and neural foramina are widely patent throughout the thoracic region.   IMPRESSION: 1. Chronic compression deformities of T7, T8, T9 and T12, with mild-to-moderate central spinal canal stenosis at T12. There is no adverse interval change. 2. There are patchy, streaky peripheral opacities present posteriorly within the upper lung zones bilaterally, which could be better assessed with a CT of the thorax.     Electronically Signed   By: Evalene Coho M.D.   On: 10/02/2023 17:14  PATIENT SURVEYS:  NDI:  NECK DISABILITY INDEX  Date: 12/20/23 Score  Pain intensity  3 = The pain is fairly severe at the moment  2. Personal care (washing, dressing, etc.) 2 = It is painful to look after myself and I am slow and careful  3. Lifting 4 =  I can only lift very light weights  4. Reading 2 =  I can read as much as I want with moderate pain in my neck  5. Headaches 3 = I have moderate headaches, which come frequently  6. Concentration 3 = I have a lot of difficulty in concentrating when I want to  7. Work 4 = I can hardly do any work at all  8. Driving 4 =  I can hardly drive at all because of severe pain in my neck  9. Sleeping 3 =  My sleep is moderately disturbed (2-3 hrs sleepless)  10. Recreation 4 =  I can hardly do any recreation activities because of pain in my neck  Total 33/50 (66%)   Minimum Detectable Change (90% confidence): 5 points or 10% points  COGNITION: Overall cognitive status: Within functional limits for tasks assessed  SENSATION: WFL  POSTURE: rounded shoulders  PALPATION: Cervical paraspinal     CERVICAL ROM:   Active ROM A/PROM (deg) eval  Flexion 30/30  Extension 30/30  Right lateral flexion 35/35   Left lateral flexion 35/35*  Right rotation 40/60  Left rotation 40/60   (Blank rows = not tested)  UPPER EXTREMITY ROM:  Active/Passive ROM Right eval Left eval  Shoulder flexion 180 180  Shoulder extension    Shoulder abduction 140/140* 140/140*  Shoulder adduction    Shoulder extension    Shoulder internal rotation at 90 deg ABd 90 80* -pain in medial side of elbow  Shoulder external rotation at 90 deg Abd   70 70    Elbow flexion    Elbow extension    Wrist flexion    Wrist extension    Wrist ulnar deviation    Wrist radial deviation    Wrist pronation    Wrist supination     (Blank rows = not tested) Combined Shoulder ER                     Occiput       Occiput   Combined Shoulder IR                       T12                T12   UPPER EXTREMITY MMT:  MMT Right eval  Left eval  Shoulder flexion 4  4  Shoulder extension 4 4  Shoulder abduction 4 4  Shoulder adduction    Shoulder extension 4 4  Shoulder internal rotation 4 4  Shoulder external rotation 4 4  Middle trapezius 4 4-  Rhomboids   4 4-  Lower trapezius 4 4-  Elbow flexion    Elbow extension    Wrist flexion    Wrist extension    Wrist ulnar deviation    Wrist radial deviation    Wrist pronation    Wrist supination    Grip strength     (Blank rows = not tested)     TREATMENT DATE:   12/25/23:  THEREX  Modified Cat Cow 2 x 10 with 5 sec for each pose.   -min VC for how to sequence the exercise Periscapular MMT (See Above)  Supine Cervical AROM Rot R/L  2 x 10   Cervical PROM (See Above)  -Pt reports most pain on left side of neck   Shoulder ROM (See Above)   Bent Over Rows with water  jug 2 x 10  -min VC to look upwards to promote better posture in mid back   Seated Shoulder Horizontal Abduction at 90 deg Abduction 1 x 10   Seated Shoulder Horizontal Abduction at 90 deg Abduction with #1 DB 1 x 10   Seated Shoulder Horizontal Abduction at 90 deg Abduction with #2 DB  1 x 10    [employer] HOME MANAGEMENT    ORTHOSTATIC VITALS  -Sitting BP 125/76 HR 76 SpO2 100%   -Standing BP 88/57  HR 80   1. Pt reports no symptoms or feelings of dizziness      -Standing after UE elbow flexion and extension for 2 min  BP 102/62  Patient provided with educational handout that provides additional strategies for dealing with symptoms like taking time between transitions, perform ankle pumps before standing up, and wear compressive garments.     12/20/23: Bilateral Upper Trap Stretch  2 x 60 sec    Cervical Extensor Stretch 2 x 60 sec                                                                                                                                  PATIENT EDUCATION:  Education details: Form and technique for correct performance of exercise and explanation about cervical deficits    Person educated:  Patient Education method: Medical illustrator Education comprehension: verbalized understanding, returned demonstration, and verbal cues required  HOME EXERCISE PROGRAM: Access Code: [email or web address] URL: [email or web address] Date: 12/25/2023 Prepared by: Toribio Servant  Exercises - Supine Cervical Rotation AROM on Flat Ball  - 1 x daily - 7 x weekly - 2 sets - 10 reps - Seated Cervical Sidebending Stretch  - 1 x daily - 7 x weekly - 3 reps - 30-60 sec  hold - Seated Cervical Sidebending Stretch (Mirrored)  - 1  x daily - 7 x weekly - 3 reps - 30-60 sec  hold - Seated Cervical Flexion Stretch with Finger Support Behind Neck  - 1 x daily - 7 x weekly - 3 reps - 60 sec  hold - Supine Suboccipital Release with Tennis Balls  - 1 x daily - 7 x weekly - 3 reps - 3-5 min hold - Standing Bent Over Single Arm Scapular Row with Table Support  - 3-4 x weekly - 3 sets - 10 reps - Standing Bent Over Single Arm Scapular Row with Table Support (Mirrored)  - 3-4 x weekly - 3 sets - 10 reps - Seated Shoulder Horizontal Abduction with Dumbbells - Thumbs Up  - 3-4 x weekly - 3 sets - 10 reps  ASSESSMENT:  CLINICAL IMPRESSION: Pt presenting for initial treatment follow eval for cervicalgia. He exhibits orthostatic hypotension that was asymptomatic during session, but likely is causing his symptoms of dizziness. He is follow up with a cardiologist later today and he was provided with an educational handout about orthostatic hypotension as well as strategies on how to best manage it. He demonstrates limited cervical ROM even passively in most cervical planes of motion indicating likely joint related restrictions. He also exhibits periscapular weakness and he will benefit from further strengthening to provide his cervical spine more support and to reduce symptoms. He will benefit from skilled PT to address these aforementioned deficits to decrease his neck and thoracic spine pain to return to  completing UE activities like washing his car and completing yard work without being limited by pain.     OBJECTIVE IMPAIRMENTS: decreased balance, decreased ROM, decreased strength, hypomobility, impaired flexibility, impaired UE functional use, and pain.   ACTIVITY LIMITATIONS: carrying, lifting, standing, bathing, dressing, reach over head, and hygiene/grooming  PARTICIPATION LIMITATIONS: cleaning, laundry, shopping, community activity, and yard work  PERSONAL FACTORS: Age and 3+ comorbidities: CAD, Depression, are also affecting patient's functional outcome.   REHAB POTENTIAL: Good  CLINICAL DECISION MAKING: Stable/uncomplicated  EVALUATION COMPLEXITY: Low   GOALS: Goals reviewed with patient? No  SHORT TERM GOALS: Target date: 01/03/2024   Patient will demonstrate undestanding of home exercise plan by performing exercises correctly with evidence of good carry over with min to no verbal or tactile cues .   Baseline: NT 12/25/23: Performing exercises independently  Goal status: ACHIEVED   2.  Patient will demonstrate understanding of how to modify exercises to decrease thoracic and cervical pain exacerbation for improved function and to decrease incidence of pain flare ups.  Baseline: Does not currently have strategy to manage this  Goal status: INITIAL   LONG TERM GOALS: Target date: 03/13/2024  Patient will show a statistically significant improvement in her neck function as evidence by >=7.5 pt decrease in her neck disability index score to better be able to move neck to improve visual field while driving to avoid accidents. Vernel et al, 2009) Baseline: 33/50 (66%) Goal status: ONGOING    2.  Patient will increase cervical mobility by  >=5 degrees AROM for all planes of motion (flexion, extension, side bending, and rotation) for improved cervical function and evidence of pain reduction. Baseline: Cervical AROM Flex 30, Ext 30, Rot R/L 40/40, SB R/L 35/35    Goal status:  ONGOING    3.  Patient will improve shoulder and periscapular strength by 1/3 grade MMT (4- to 4) for improved cervical stability and UE function to carry out upper extremity activities like washing his car and gardening without being  limited by pain and discomfort.  Baseline: Shoulder Flex R/L 4/4, Shoulder Abd R/L 4/4    Goal status: ONGOING    4.  Patient will reduce neck and thoracic pain to <=4/10 NRPS while performing UE activities like washing his car and doing yard work as evidence of improved cervical and thoracic function.  Baseline: 10/10 cervical paraspinal and rhomboid pain with UE activity.  Goal status: ONGOING      PLAN:  PT FREQUENCY: 1-2x/week  PT DURATION: 12 weeks  PLANNED INTERVENTIONS: [id number]- PT Re-evaluation, 97750- Physical Performance Testing, 97110-Therapeutic exercises, 97530- Therapeutic activity, [id number]- Neuromuscular re-education, 97535- Self Care, [id number]- Manual therapy, [id number]- Gait training, [phone]- Canalith repositioning, [id number]- Aquatic Therapy, [phone]- Electrical stimulation (unattended), [phone]- Electrical stimulation (manual), [id number]- Traction (mechanical), 20560 (1-2 muscles), 20561 (3+ muscles)- Dry Needling, Patient/Family education, Balance training, Joint mobilization, Joint manipulation, Spinal manipulation, Spinal mobilization, Vestibular training, DME instructions, Cryotherapy, and Moist heat  PLAN FOR NEXT SESSION:  Shoulder and Periscapular Strengthening: PNF D2 Flexion and Extension.   Fall Screen: 4 stage balance.  Manual therapy massage and trigger point release of periscapular and cervical musculature.    Toribio Servant PT, DPT  [facility] [address] [place], [address] Phone: [phone]   Fax:  [phone]

## 2023-12-25 NOTE — Patient Instructions (Addendum)
 Medication Instructions:   Please decrease the losartan  down to 50 mg once a day  If you need a refill on your cardiac medications before your next appointment, please call your pharmacy.   Lab work: No new labs needed  Testing/Procedures:  Your physician has requested that you have an echocardiogram. Echocardiography is a painless test that uses sound waves to create images of your heart. It provides your doctor with information about the size and shape of your heart and how well your heart's chambers and valves are working.   You may receive an ultrasound enhancing agent through an IV if needed to better visualize your heart during the echo. This procedure takes approximately one hour.  There are no restrictions for this procedure.  This will take place at [address] [facility]) [address], [place] [address]  Please note: We ask at that you not bring children with you during ultrasound (echo/ vascular) testing. Due to room size and safety concerns, children are not allowed in the ultrasound rooms during exams. Our front office staff cannot provide observation of children in our lobby area while testing is being conducted. An adult accompanying a patient to their appointment will only be allowed in the ultrasound room at the discretion of the ultrasound technician under special circumstances. We apologize for any inconvenience.   Follow-Up: At [facility], you and your health needs are our priority.  As part of our continuing mission to provide you with exceptional heart care, we have created designated Provider Care Teams.  These Care Teams include your primary Cardiologist (physician) and Advanced Practice Providers (APPs -  Physician Assistants and Nurse Practitioners) who all work together to provide you with the care you need, when you need it.  You will need a follow up appointment in 6 months, app ok  Providers on your designated Care Team:   Lonni Meager,  NP Bernardino Bring, PA-C [place], [place]  COVID-19 Vaccine Information can be found at: [email or web address] For questions related to vaccine distribution or appointments, please email [email or web address] or call [phone].

## 2023-12-26 ENCOUNTER — Ambulatory Visit
Admission: RE | Admit: 2023-12-26 | Discharge: 2023-12-26 | Disposition: A | Discharge status: Home / Self Care | Source: Ambulatory Visit | Attending: Pulmonary Disease | Admitting: Pulmonary Disease

## 2023-12-26 DIAGNOSIS — M4802 Spinal stenosis, cervical region: Secondary | ICD-10-CM | POA: Insufficient documentation

## 2023-12-26 DIAGNOSIS — R633 Feeding difficulties, unspecified: Secondary | ICD-10-CM | POA: Diagnosis not present

## 2023-12-26 DIAGNOSIS — R1312 Dysphagia, oropharyngeal phase: Secondary | ICD-10-CM | POA: Insufficient documentation

## 2023-12-26 NOTE — Progress Notes (Signed)
 Modified Barium Swallow Study  Patient Details  Name: Shawn Lam MRN: [id number] Date of Birth: July 08, 1950  Today's Date: 12/26/2023  Modified Barium Swallow completed.  Full report located under Chart Review in the Imaging Section.  History of Present Illness Pt is a 73 yo male w/ ACDF surgery 03/2023.  PMH: GERD(Not on a PPI per chart), Actinic keratosis (02/04/2021), Anemia, Anxiety, Asymptomatic LV dysfunction, BCC (basal cell carcinoma of skin) (08/05/2020), Berger's disease, CAD (coronary artery disease) (06/2005), resting tremor, Depression, Dilated aortic root (HCC), Hematuria, Hyperlipidemia, Hypothyroidism, Insomnia, Left ureteral stone, Orthostatic hypotension, OSA (obstructive sleep apnea), PVC's (premature ventricular contractions) (07/07/2016), Renal disorder, Rosacea, and Urticaria.  Pt is followed by Cardiology for Coronary Artery Stenosis currently; also followed for Migraines.     EGD in 2023 revealed Gastritis.     CT of Chest 10/2023: 1. Interim development of patchy consolidation and ground-glass   disease most evident at the right middle lobe and right lower lobes   with additional areas of mild ground-glass disease and scattered   clustered nodularity, findings are suspicious for multifocal   infection/pneumonia. Imaging follow-up to resolution is recommended.   2. Emphysema.   3. Aortic atherosclerosis.           Dysphagia - reported by pt as Chronic, ever since neck surgery in December 2024. Pt was seen in the emergency room s/p Fall, notes detailing that he slipped on soap in the shower, fell backwards on right side hit head and neck against shower seat, possible brief loss of consciousness, per chart.  ACDF 03/2023 per chart.  Since the neck surgery in 03/2023, he has noted food, liquids and pill getting stuck on the right side of my throat more.  I used to cough a lot more but not as much now.  I cough up what gets stuck..  He has lost significant weight per chart  notes due to decreased eating.  Pt endorsed using his home O2 when he feels SOB.   Clinical Impression Patient appears to present w/ Chronic, moderate-severe Pharyngeal phase dysphagia per this assessment today. It appeared there was some impact from changes in the Cervical Esophagus s/p ACDF surgery(03/2023 per chart notes) including structural/tissue changes in/aournd the CP segment and cervical esophagus.  Silent aspiratoin occurred w/ thin liquids (inconsistently) during this study.     Oral phase appeared [facility] for timely bolus management for mastication and control of bolus propulsion for A-P transfer; oral clearing complete b/t trials. Swallow initiation appeared to occur w/ thin and nectar liquids as the bolus contacts the posterior surface of the epiglottis; at the valleculae w/ all other consistencies.  Pharyngeal phase is c/b notably reduced hyolaryngeal excursion. Epiglottic movement/inversion is reduced. Laryngeal vestibule closure is incomplete w/ laryngeal penetration/aspiration occurring w/ thin liquids (inconsistently) during the swallow; also noted min+ laryngeal penetration>aspiration from the diffuse pharyngeal residue remaining Between swallows/trials. The laryngeal penetration and aspiration was SILENT during this study. Pt was able to use a Cued Cough strategy to reduce/clear the penetrated/aspirated material in the viewable airway. MOD+ pharyngeal residue remained in valleculae>pyriform sinuses post swallow; this appears attributable to reduction of flow through the pharyngoesophageal segment (reduced amplitude/duration of PE segment opening due to weak hyolaryngeal excursion). Pt was able to reduce this pharyngeal residue when using a Right head turn and slight chin tuck(reduced ROM d/t ACDF) and multiple swallows but was not able to fully clear it. No overt bolus stasis was noted in the cervical esophagus viewable.   Pt was  able to follow through w/ swallow strategies given verbal  instruction. OF NOTE: a Barium Tablet was NOT given during this study- recommend for Medications to be given via alternative means (CRUSHED, LIQUID, DISSOLVABLE). Pt to f/u w/ his Pharmacy to address this further.    Recommend continue current diet of easy to eat foods at home; thin liquids via cup- at this time. Oral care Before any oral intake. Swallow Strategies Recommended: No straws, moistened foods, SMALL bites/sips, Head Turn RIGHT w/ slight chin tuck, multiple swallows to aid clearing, AND strong Cough/throat clear b/t bites/sips and at end of meal.  D/t the Chronicity of pt's Pharyngeal phase Dysphagia w/ weight loss and pulmonary impact per recent Chest CT, pt and Family may need to discuss alternative means of nutrition/hydration as support w/ his Medical Team/PCP, while seeking swallow therapy. Factors that may increase risk of adverse event in presence of aspiration Noe & Lianne 2021): Poor general health and/or compromised immunity;Respiratory or GI disease  Swallow Evaluation Recommendations Recommendations: PO diet  PO Diet Recommendation: Regular;Dysphagia 3 (Mechanical soft); Thin liquids (Level 0) (small-cut, easy to eat moistened foods) Liquid Administration via: Cup; No straw Medication Administration: Crushed with puree (vs liquid vs dissolveable forms) Supervision: Patient able to self-feed;Intermittent supervision/cueing for swallowing strategies Swallowing strategies: Minimize environmental distractions; Slow rate;Small bites/sips; Multiple dry swallows after each bite/sip; Hard cough after swallowing; Chin tuck; Head turn right during swallowing Postural changes: Position pt fully upright for meals; Stay upright 30-60 min after meals; Out of bed for meals (GERD precs.) Oral care recommendations: Oral care BID (2x/day); Oral care before PO; Pt independent with oral care Recommended consults: OP Speech Therapy for Dysphagia; Consider ENT consultation(direct view of ACDF  area (C5-7) and any structural/tissue changes); Consider Dietitian consultation         Comer Portugal, MS, CCC-SLP Speech Language Pathologist Rehab Services; [facility] [place] [phone] (ascom) Nyeshia Mysliwiec 12/26/2023,2:46 PM

## 2023-12-27 ENCOUNTER — Telehealth: Payer: Self-pay | Admitting: *Deleted

## 2023-12-27 ENCOUNTER — Ambulatory Visit: Admitting: Physical Therapy

## 2023-12-27 DIAGNOSIS — M542 Cervicalgia: Secondary | ICD-10-CM

## 2023-12-27 DIAGNOSIS — R1312 Dysphagia, oropharyngeal phase: Secondary | ICD-10-CM | POA: Diagnosis not present

## 2023-12-27 DIAGNOSIS — M546 Pain in thoracic spine: Secondary | ICD-10-CM

## 2023-12-27 NOTE — Therapy (Addendum)
 OUTPATIENT PHYSICAL THERAPY CERVICAL TREATMENT     Patient Name: Shawn Lam MRN: [id number] DOB:1950-11-13, 73 y.o., male Today's Date: 12/27/2023  END OF SESSION:  PT End of Session - 12/27/23 1528     Visit Number 3    Number of Visits 24    Date for PT Re-Evaluation 03/13/24    Authorization Type Medicare 2025    Authorization - Visit Number 3    Authorization - Number of Visits 24    Progress Note Due on Visit 10    PT Start Time 1520    PT Stop Time 1600    PT Time Calculation (min) 40 min    Activity Tolerance Patient tolerated treatment well    Behavior During Therapy [facility] for tasks assessed/performed           Past Medical History:  Diagnosis Date   Actinic keratosis 02/04/2021   right lateral neck   Anemia    Anxiety    Asymptomatic LV dysfunction    50-55% by echo 06/2016   BCC (basal cell carcinoma of skin) 08/05/2020   left neck infra auricular - nodular pattern - treated with [facility] 09/17/2020   Berger's disease    CAD (coronary artery disease) 06/2005   PCI of LAD and RCA   Depression    Dilated aortic root (HCC)    4cm at sinus of Valsalva   Hematuria    Hyperlipidemia    Hypothyroidism    Insomnia    Left ureteral stone    Orthostatic hypotension    negative tilt table   OSA (obstructive sleep apnea)    moderate with AHI 17/hr, uses CPAP nightly(01/15/19 - has Inspire implant)   PVC's (premature ventricular contractions) 07/07/2016   Renal disorder    PT IS SEEING DR. BETSEY- NEPHROLOGIST   Rosacea    Urticaria    Past Surgical History:  Procedure Laterality Date   ANTERIOR CERVICAL DECOMP/DISCECTOMY FUSION N/A 03/13/2023   Procedure: CERVICAL FOUR-FIVE, CERVICAL FIVE-SIX ANTERIOR CERVICAL DISCECTOMY/DECOMPRESSION FUSION;  Surgeon: Onetha Kuba, MD;  Location: [facility] OR;  Service: Neurosurgery;  Laterality: N/A;  3C   CARDIAC CATHETERIZATION  09/06/2013   [facility]   CARDIAC CATHETERIZATION     COLONOSCOPY WITH PROPOFOL  N/A 01/21/2019   Procedure:  COLONOSCOPY WITH PROPOFOL ;  Surgeon: Jinny Carmine, MD;  Location: [facility];  Service: Endoscopy;  Laterality: N/A;  sleep apnea   COLONOSCOPY WITH PROPOFOL  N/A 10/19/2021   Procedure: COLONOSCOPY WITH PROPOFOL ;  Surgeon: Jinny Carmine, MD;  Location: [facility] ENDOSCOPY;  Service: Endoscopy;  Laterality: N/A;   CORONARY ANGIOPLASTY  2004   STENT PLACEMENT   CYSTOSCOPY WITH RETROGRADE PYELOGRAM, URETEROSCOPY AND STENT PLACEMENT Left 03/19/2014   Procedure: CYSTOSCOPY WITH RETROGRADE PYELOGRAM, URETEROSCOPY AND STENT PLACEMENT;  Surgeon: Mickey Ricardo KATHEE Alvaro, MD;  Location: [facility] ORS;  Service: Urology;  Laterality: Left;   CYSTOSCOPY WITH RETROGRADE PYELOGRAM, URETEROSCOPY AND STENT PLACEMENT Bilateral 05/20/2015   Procedure:  CYSTOSCOPY WITH BILATERAL RETROGRADE PYELOGRAM,RIGHT  DIAGNOSTIC URETEROSCOPY ,LEFT URETEROSCOPY WITH HOLMIUM LASER  AND BILATERAL  STENT PLACEMENT ;  Surgeon: Ricardo Alvaro, MD;  Location: [facility];  Service: Urology;  Laterality: Bilateral;   DRUG INDUCED ENDOSCOPY N/A 10/20/2017   Procedure: DRUG INDUCED SLEEP ENDOSCOPY;  Surgeon: Carlie Clark, MD;  Location: [facility];  Service: ENT;  Laterality: N/A;   EP IMPLANTABLE DEVICE N/A 05/13/2016   Procedure: Loop Recorder Insertion;  Surgeon: Elspeth JAYSON Sage, MD;  Location: [facility];  Service: Cardiovascular;  Laterality: N/A;   ESOPHAGOGASTRODUODENOSCOPY N/A 10/19/2021   Procedure: ESOPHAGOGASTRODUODENOSCOPY (EGD);  Surgeon: Jinny Carmine, MD;  Location: [facility] END[facility]COPY;  Service: Endoscopy;  Laterality: N/A;   ESOPHAGOGASTRODUODENOSCOPY (EGD) WITH PROPOFOL  N/A 11/30/2018   Procedure: ESOPHAGOGASTRODUODENOSCOPY (EGD) WITH PROPOFOL ;  Surgeon: Therisa Bi, MD;  Location: [facility] ENDOSCOPY;  Service: Gastroenterology;  Laterality: N/A;   ESOPHAGOGASTRODUODENOSCOPY (EGD) WITH PROPOFOL  N/A 12/01/2018   Procedure: ESOPHAGOGASTRODUODENOSCOPY (EGD) WITH PROPOFOL ;  Surgeon: Jinny Carmine, MD;  Location: [facility] ENDOSCOPY;   Service: Endoscopy;  Laterality: N/A;   HOLMIUM LASER APPLICATION Bilateral 05/20/2015   Procedure: HOLMIUM LASER APPLICATION;  Surgeon: Ricardo Likens, MD;  Location: [facility];  Service: Urology;  Laterality: Bilateral;   IMPLIMENTATION OF A HYPOGLOSSAL NERVE STIMULATOR  12/08/2017   OSA    LEFT HEART CATHETERIZATION WITH CORONARY ANGIOGRAM N/A 09/06/2013   Procedure: LEFT HEART CATHETERIZATION WITH CORONARY ANGIOGRAM;  Surgeon: Victory LELON Claudene DOUGLAS, MD;  Location: [facility] CATH LAB;  Service: Cardiovascular;  Laterality: N/A;   LEFT KNEE CAP  SURGERY ABOUT 40 YRS AGO  ? 1970     STONE EXTRACTION WITH BASKET Left 03/19/2014   Procedure: STONE EXTRACTION WITH BASKET;  Surgeon: Mickey Ricardo KATHEE Likens, MD;  Location: [facility];  Service: Urology;  Laterality: Left;   TRANSURETHRAL RESECTION OF PROSTATE N/A 08/25/2021   Procedure: TRANSURETHRAL RESECTION OF THE PROSTATE (TURP);  Surgeon: Likens Ricardo, MD;  Location: [facility];  Service: Urology;  Laterality: N/A;  1 HR   Patient Active Problem List   Diagnosis Date Noted   Feeding difficulties, unspecified 12/20/2023   Shortness of breath 12/20/2023   Abnormal CT of the chest 10/10/2023   Elevated blood sugar 06/30/2023   Carotid stenosis 05/11/2023   Spinal stenosis in cervical region 03/13/2023   Osteoarthritis of spine with radiculopathy, cervical region 07/14/2022   Cervicogenic headache 11/30/2021   Chronic neck pain 11/30/2021   Dysphagia    Polyp of colon    Prostatic hyperplasia 08/25/2021   Chronic insomnia 03/19/2020   Peripheral neuropathy 03/19/2020   Iron deficiency anemia 01/06/2020   Allergic dermatitis 07/08/2019   Osteoarthritis of left knee 02/26/2019   History of colonic polyps    History of duodenal ulcer    CKD (chronic kidney disease), stage IIIa    Bilateral chronic knee pain 11/09/2018   Epistaxis 10/02/2018   Subjective tinnitus, bilateral 10/02/2018   Chronic pain of right thumb 02/22/2018   Acute bilateral thoracic back pain  01/23/2018   Osteoporosis without current pathological fracture 02/10/2017   Hypothyroidism 11/02/2016   Thoracic compression fracture (HCC) 09/06/2016   PVC's (premature ventricular contractions) 07/07/2016   Hypoalbuminemia 06/28/2016   IgA nephropathy 06/07/2016   MDD (major depressive disorder), recurrent episode, moderate (HCC) 11/20/2015   OSA (obstructive sleep apnea) 02/05/2014   Orthostatic hypotension 02/07/2013   Scotoma involving central area 01/29/2013   Optic neuropathy 01/28/2013   HEMATURIA UNSPECIFIED 01/21/2010   HLD (hyperlipidemia) 07/05/2006   Generalized anxiety disorder 07/05/2006   CAD (coronary artery disease) 07/05/2006   GERD 07/05/2006    PCP: Dr. Greig Ring     REFERRING PROVIDER: Dr. Arley Helling    REFERRING DIAG: M54.6 (ICD-10-CM) - Acute bilateral thoracic back pain   THERAPY DIAG:  Cervicalgia  Pain in thoracic spine  Rationale for Evaluation and Treatment: Rehabilitation  ONSET DATE: May 2025    SUBJECTIVE:  SUBJECTIVE STATEMENT:           Pt states that his neck pain is feeling improved but that it still is high and that he still has a burning sensation in her shoulder blades.   Hand dominance: Right  PERTINENT HISTORY:  Pt reports being referred to physical therapy for ongoing neck pain following cervical fusion in May 2020 and newly developed thoracic pain from T12 compression fracture that occred sometime in spring of 2025 and has radicular pain patterns. He has since has pain localized to upper traps and suboccipitals and that is non-radiating. He discontinued his prednisone  and gabapentin  two weeks ago and he was unable barely able to walk for two weeks. He describes feeling increased fatigue and weakness to the point where he struggled to  walk. Pt also reported repeated falls in the setting of orthostatic hypotension with him experiencing syncope when he stands up from a seated position. He describes feeling a burning sensation that is also localized to his scapulae and that it occurs when he is doing UE activities like washing his car that is likely the radicular pain from thoracic vertebrae compression fracture. PAIN:  Are you having pain? Yes: NPRS scale: 4-5/10 NRPS, 10/10 (worst-when doing UE activities)  Pain location: Cervical Paraspinal  Pain description: Burning sensation   Aggravating factors: Strenuous activities like washing his car. Relieving factors: Sitting down and thoracic retraction  PRECAUTIONS: None  RED FLAGS: Cervical red flags: Dysphagia Yes: followed by pulmonologist and Dysarthria      WEIGHT BEARING RESTRICTIONS: No  FALLS:  Has patient fallen in last 6 months? Yes. Number of falls 6, pt reports that falls are happening in the context of syncope when standing up from a seated position (Orthostatic Hypotension) , where he blacks out and falls. He is following up with his PCP, Dr. Karlene who is working with him on stabilizing his blood pressure and she has recently taken him off Gabapentin  to see if this helps. Pt reports that he does note an improvement in his symptoms with less light headedness since going off medication.   LIVING ENVIRONMENT: Lives with: lives with their spouse Lives in: House/apartment Stairs: No Has following equipment at home: Single point cane and Environmental consultant - 4 wheeled  OCCUPATION: Retired     PLOF: Independent  PATIENT GOALS: Relief of his neck pain while performing upper extremity activities.    NEXT MD VISIT: October 2025     OBJECTIVE:  Note: Objective measures were completed at Evaluation unless otherwise noted.  VITALS  109/72  hr 72 SpO2 100  DIAGNOSTIC FINDINGS:  CLINICAL DATA:  Back pain with occasional radicular symptoms.   EXAM: MRI THORACIC SPINE  WITHOUT CONTRAST   TECHNIQUE: Multiplanar, multisequence MR imaging of the thoracic spine was performed. No intravenous contrast was administered.   COMPARISON:  MRI of the thoracic spine dated April 17, 2017.   FINDINGS: Alignment:  Physiologic.   Vertebrae: Chronic compression deformities of T7, T8, T9 and T12. There is retropulsion of the posterosuperior corner of T12 again demonstrated, resulting in mild-to-moderate central spinal canal stenosis, but no spinal cord impingement.   Cord:  Normal in morphology and signal intensity.   Paraspinal and other soft tissues: There are patchy, streaky opacities present in the posterior periphery of the lungs bilaterally the immediate paraspinal soft tissues are unremarkable.   Disc levels:   There is minimal posterior disc bulging present at T7-8 common T9-10 and T10-11, causing minimal central spinal canal stenosis at  each level. There is mild-to-moderate central spinal canal stenosis at T12 secondary to retropulsion of the superior posterior corner of the vertebral body. Otherwise, on the spinal canal and neural foramina are widely patent throughout the thoracic region.   IMPRESSION: 1. Chronic compression deformities of T7, T8, T9 and T12, with mild-to-moderate central spinal canal stenosis at T12. There is no adverse interval change. 2. There are patchy, streaky peripheral opacities present posteriorly within the upper lung zones bilaterally, which could be better assessed with a CT of the thorax.     Electronically Signed   By: Evalene Coho M.D.   On: 10/02/2023 17:14  PATIENT SURVEYS:  NDI:  NECK DISABILITY INDEX  Date: 12/20/23 Score  Pain intensity 3 = The pain is fairly severe at the moment  2. Personal care (washing, dressing, etc.) 2 = It is painful to look after myself and I am slow and careful  3. Lifting 4 =  I can only lift very light weights  4. Reading 2 =  I can read as much as I want with moderate  pain in my neck  5. Headaches 3 = I have moderate headaches, which come frequently  6. Concentration 3 = I have a lot of difficulty in concentrating when I want to  7. Work 4 = I can hardly do any work at all  8. Driving 4 =  I can hardly drive at all because of severe pain in my neck  9. Sleeping 3 =  My sleep is moderately disturbed (2-3 hrs sleepless)  10. Recreation 4 =  I can hardly do any recreation activities because of pain in my neck  Total 33/50 (66%)   Minimum Detectable Change (90% confidence): 5 points or 10% points  COGNITION: Overall cognitive status: Within functional limits for tasks assessed  SENSATION: WFL  POSTURE: rounded shoulders  PALPATION: Cervical paraspinal     CERVICAL ROM:   Active ROM A/PROM (deg) eval  Flexion 30/30  Extension 30/30  Right lateral flexion 35/35   Left lateral flexion 35/35*  Right rotation 40/60  Left rotation 40/60   (Blank rows = not tested)  UPPER EXTREMITY ROM:  Active/Passive ROM Right eval Left eval  Shoulder flexion 180 180  Shoulder extension    Shoulder abduction 140/140* 140/140*  Shoulder adduction    Shoulder extension    Shoulder internal rotation at 90 deg ABd 90 80* -pain in medial side of elbow  Shoulder external rotation at 90 deg Abd   70 70    Elbow flexion    Elbow extension    Wrist flexion    Wrist extension    Wrist ulnar deviation    Wrist radial deviation    Wrist pronation    Wrist supination     (Blank rows = not tested) Combined Shoulder ER                     Occiput       Occiput   Combined Shoulder IR                       T12                T12   UPPER EXTREMITY MMT:  MMT Right eval Left eval  Shoulder flexion 4 4  Shoulder extension 4 4  Shoulder abduction 4 4  Shoulder adduction    Shoulder extension 4 4  Shoulder internal rotation 4 4  Shoulder external rotation 4 4  Middle trapezius 4 4-  Rhomboids   4 4-  Lower trapezius 4 4-  Elbow flexion    Elbow extension     Wrist flexion    Wrist extension    Wrist ulnar deviation    Wrist radial deviation    Wrist pronation    Wrist supination    Grip strength     (Blank rows = not tested)     TREATMENT DATE:   12/27/23  THEREX  UBE  with seat at 11 2.5 min forward and 2.5 min backward   D2 PNF Shoulder Flexion/Extension with AROM 1 x 10   D2 PNF Shoulder Flexion/Extension with #1 DB  1 x 10    SELF CARE MANAGEMENT   Discussion about results from swallow exam including results including aspiration from liquid entering lungs and what the plan appears to be for him to manage his dysphagia.   Education to seek out further recommendations for foods that patient could eat without choking hazard like protein shakes to prevent further weight loss and to receive appropriate protein intake to maintain muscle mass and to build further muscle.      PATIENT EDUCATION:  Education details: Form and technique for correct performance of exercise and explanation about cervical deficits    Person educated: Patient Education method: Medical illustrator Education comprehension: verbalized understanding, returned demonstration, and verbal cues required  HOME EXERCISE PROGRAM: Access Code: [email or web address] URL: [email or web address] Date: 12/27/2023 Prepared by: Toribio Servant  Exercises - Supine Cervical Rotation AROM on Flat Ball  - 1 x daily - 7 x weekly - 2 sets - 10 reps - Seated Cervical Sidebending Stretch  - 1 x daily - 7 x weekly - 3 reps - 30-60 sec  hold - Seated Cervical Sidebending Stretch (Mirrored)  - 1 x daily - 7 x weekly - 3 reps - 30-60 sec  hold - Seated Cervical Flexion Stretch with Finger Support Behind Neck  - 1 x daily - 7 x weekly - 3 reps - 60 sec  hold - Supine Suboccipital Release with Tennis Balls  - 1 x daily - 7 x weekly - 3 reps - 3-5 min hold - Standing Bent Over Single Arm Scapular Row with Table Support  - 3-4 x weekly - 3 sets - 10 reps - Standing Bent  Over Single Arm Scapular Row with Table Support (Mirrored)  - 3-4 x weekly - 3 sets - 10 reps - Seated Shoulder Horizontal Abduction with Dumbbells - Thumbs Up  - 3-4 x weekly - 3 sets - 10 reps - Standing Shoulder Single Arm PNF D2 Flexion with Resistance  - 3-4 x weekly - 3 sets - 10 reps - Standing Shoulder Single Arm PNF D2 Flexion with Resistance (Mirrored)  - 3-4 x weekly - 3 sets - 10 reps  ASSESSMENT:  CLINICAL IMPRESSION: Pt shows ongoing pain tolerance with UE activity despite having elevated pain levels at the start of the session. PT concerned about ongoing weight loss especially because it will make it difficult for patient to build new muscle to improve cervical and periscapular strength. PT educated pt on potential dietary ideas for him to receive proper nutrition without risk of aspiration like ensure shakes or yogurt. PT also recommends that pt seek further advice from speech pathologist and/or registered dietician to give him guidance on what he can and cannot eat. He will continue to benefit from further strengthening to provide his cervical spine more support  and to reduce symptoms. He will benefit from skilled PT to address these aforementioned deficits to decrease his neck and thoracic spine pain to return to completing UE activities like washing his car and completing yard work without being limited by pain.     OBJECTIVE IMPAIRMENTS: decreased balance, decreased ROM, decreased strength, hypomobility, impaired flexibility, impaired UE functional use, and pain.   ACTIVITY LIMITATIONS: carrying, lifting, standing, bathing, dressing, reach over head, and hygiene/grooming  PARTICIPATION LIMITATIONS: cleaning, laundry, shopping, community activity, and yard work  PERSONAL FACTORS: Age and 3+ comorbidities: CAD, Depression, are also affecting patient's functional outcome.   REHAB POTENTIAL: Good  CLINICAL DECISION MAKING: Stable/uncomplicated  EVALUATION COMPLEXITY:  Low   GOALS: Goals reviewed with patient? No  SHORT TERM GOALS: Target date: 01/03/2024   Patient will demonstrate undestanding of home exercise plan by performing exercises correctly with evidence of good carry over with min to no verbal or tactile cues .   Baseline: NT 12/25/23: Performing exercises independently  Goal status: ACHIEVED   2.  Patient will demonstrate understanding of how to modify exercises to decrease thoracic and cervical pain exacerbation for improved function and to decrease incidence of pain flare ups.  Baseline: Does not currently have strategy to manage this  Goal status: ONGOING      LONG TERM GOALS: Target date: 03/13/2024  Patient will show a statistically significant improvement in her neck function as evidence by >=7.5 pt decrease in her neck disability index score to better be able to move neck to improve visual field while driving to avoid accidents. Vernel et al, 2009) Baseline: 33/50 (66%) Goal status: ONGOING    2.  Patient will increase cervical mobility by  >=5 degrees AROM for all planes of motion (flexion, extension, side bending, and rotation) for improved cervical function and evidence of pain reduction. Baseline: Cervical AROM Flex 30, Ext 30, Rot R/L 40/40, SB R/L 35/35    Goal status: ONGOING    3.  Patient will improve shoulder and periscapular strength by 1/3 grade MMT (4- to 4) for improved cervical stability and UE function to carry out upper extremity activities like washing his car and gardening without being limited by pain and discomfort.  Baseline: Shoulder Flex R/L 4/4, Shoulder Abd R/L 4/4    Goal status: ONGOING    4.  Patient will reduce neck and thoracic pain to <=4/10 NRPS while performing UE activities like washing his car and doing yard work as evidence of improved cervical and thoracic function.  Baseline: 10/10 cervical paraspinal and rhomboid pain with UE activity.  Goal status: ONGOING      PLAN:  PT FREQUENCY:  1-2x/week  PT DURATION: 12 weeks  PLANNED INTERVENTIONS: 97164- PT Re-evaluation, 97750- Physical Performance Testing, 97110-Therapeutic exercises, 97530- Therapeutic activity, [id number]- Neuromuscular re-education, 97535- Self Care, [id number]- Manual therapy, [id number]- Gait training, [phone]- Canalith repositioning, [id number]- Aquatic Therapy, [phone]- Electrical stimulation (unattended), [phone]- Electrical stimulation (manual), [id number]- Traction (mechanical), 20560 (1-2 muscles), 20561 (3+ muscles)- Dry Needling, Patient/Family education, Balance training, Joint mobilization, Joint manipulation, Spinal manipulation, Spinal mobilization, Vestibular training, DME instructions, Cryotherapy, and Moist heat  PLAN FOR NEXT SESSION:  Cervical SNAGs. Shoulder and Periscapular Strengthening: lat pull down and push up with a plus.   Fall Screen: 4 stage balance.  Manual therapy massage and trigger point release of periscapular and cervical musculature.    Toribio Servant PT, DPT  [facility] [address] [place], [address] Phone: [phone]  Fax:  [phone]

## 2023-12-27 NOTE — Telephone Encounter (Signed)
 Spoke with Comer that fax has not been received yet.  Direct fax number of [phone] provided to refax form to.

## 2023-12-27 NOTE — Telephone Encounter (Signed)
 Discussion with  Comer Portugal, SLP  Silent  aspiration seen.. Issues swallowing ever since ACDF.  He has had significant weight loss.  Concern for  likely chronic aspiration on recent CT chest.  Pt plans to do Speech therapy at [facility] for  dysphagia... order signed.    Recommended nutrition referral  for more nutritive  swallowing and ENT eval to eval site of ACDF. Need appt for  discussion  with patient of findings and of possible eval of alternative means of feeding.   I will CC: Dr. Lenda for additional recommendations as well.  CC: Lenda

## 2023-12-27 NOTE — Telephone Encounter (Signed)
 Fax received and placed in Dr. Sherrel office in box to review.

## 2023-12-27 NOTE — Telephone Encounter (Signed)
 Copied from CRM [id number]. Topic: Referral - Status >> Dec 27, 2023  9:36 AM Harlene ORN wrote: Reason for CRM: Comer Portugal [facility] sent over a fax today four minutes ago labelled as, Modified Barium Swallows Study for [employer] Checking to see if it has been received. Phone: [phone]

## 2023-12-28 ENCOUNTER — Ambulatory Visit: Payer: Self-pay | Admitting: Family Medicine

## 2023-12-28 ENCOUNTER — Other Ambulatory Visit (INDEPENDENT_AMBULATORY_CARE_PROVIDER_SITE_OTHER)

## 2023-12-28 DIAGNOSIS — M810 Age-related osteoporosis without current pathological fracture: Secondary | ICD-10-CM

## 2023-12-28 LAB — BASIC METABOLIC PANEL WITH GFR
BUN: 27 mg/dL — ABNORMAL HIGH (ref 6–23)
CO2: 27 meq/L (ref 19–32)
Calcium: 9.3 mg/dL (ref 8.4–10.5)
Chloride: 106 meq/L (ref 96–112)
Creatinine, Ser: 1.44 mg/dL (ref 0.40–1.50)
GFR: 48.2 mL/min — ABNORMAL LOW (ref >60.00)
Glucose, Bld: 90 mg/dL (ref 70–99)
Potassium: 4.9 meq/L (ref 3.5–5.1)
Sodium: 139 meq/L (ref 135–145)

## 2023-12-28 NOTE — Progress Notes (Signed)
 No critical labs need to be addressed urgently. We will discuss labs in detail at upcoming office visit.

## 2023-12-29 ENCOUNTER — Other Ambulatory Visit: Payer: Self-pay | Admitting: Family Medicine

## 2023-12-29 ENCOUNTER — Encounter: Payer: Self-pay | Admitting: Family Medicine

## 2023-12-29 ENCOUNTER — Ambulatory Visit: Admitting: Family Medicine

## 2023-12-29 VITALS — BP 100/60 | HR 64 | Temp 97.5°F | Ht 72.0 in | Wt 119.0 lb

## 2023-12-29 DIAGNOSIS — R634 Abnormal weight loss: Secondary | ICD-10-CM | POA: Diagnosis not present

## 2023-12-29 DIAGNOSIS — T17908A Unspecified foreign body in respiratory tract, part unspecified causing other injury, initial encounter: Secondary | ICD-10-CM | POA: Insufficient documentation

## 2023-12-29 DIAGNOSIS — E440 Moderate protein-calorie malnutrition: Secondary | ICD-10-CM | POA: Diagnosis not present

## 2023-12-29 DIAGNOSIS — T17900D Unspecified foreign body in respiratory tract, part unspecified causing asphyxiation, subsequent encounter: Secondary | ICD-10-CM | POA: Diagnosis not present

## 2023-12-29 DIAGNOSIS — R1310 Dysphagia, unspecified: Secondary | ICD-10-CM

## 2023-12-29 DIAGNOSIS — K5909 Other constipation: Secondary | ICD-10-CM

## 2023-12-29 DIAGNOSIS — R296 Repeated falls: Secondary | ICD-10-CM | POA: Diagnosis not present

## 2023-12-29 DIAGNOSIS — Z9981 Dependence on supplemental oxygen: Secondary | ICD-10-CM | POA: Insufficient documentation

## 2023-12-29 DIAGNOSIS — J9611 Chronic respiratory failure with hypoxia: Secondary | ICD-10-CM

## 2023-12-29 DIAGNOSIS — T17900A Unspecified foreign body in respiratory tract, part unspecified causing asphyxiation, initial encounter: Secondary | ICD-10-CM | POA: Insufficient documentation

## 2023-12-29 DIAGNOSIS — R400 Somnolence: Secondary | ICD-10-CM

## 2023-12-29 DIAGNOSIS — T17908D Unspecified foreign body in respiratory tract, part unspecified causing other injury, subsequent encounter: Secondary | ICD-10-CM

## 2023-12-29 DIAGNOSIS — R21 Rash and other nonspecific skin eruption: Secondary | ICD-10-CM

## 2023-12-29 DIAGNOSIS — R29898 Other symptoms and signs involving the musculoskeletal system: Secondary | ICD-10-CM

## 2023-12-29 NOTE — Assessment & Plan Note (Signed)
 Acute, now significantly improved with stopping primidone , gabapentin , decreasing dose of Ambien  and decreasing dose of Zonegran . Asked him to speak with his neurologist about further decreasing of Zonegran  or using a medication that is less sedating for headache.

## 2023-12-29 NOTE — Progress Notes (Signed)
 Patient ID: Shawn Lam, male    DOB: [birth year], 73 y.o.   MRN: [id number]  This visit was conducted in person.  BP 100/60   Pulse 64   Temp (!) 97.5 F (36.4 C) (Temporal)   Ht 6' (1.829 m)   Wt 119 lb (54 kg)   SpO2 98%   BMI 16.14 kg/m    CC:  Chief Complaint  Patient presents with   Medical Management of Chronic Issues    Follow up Weakness and Weight Loss    Subjective:   HPI: Shawn Lam is a 73 y.o. male presenting on 12/29/2023 for Medical Management of Chronic Issues (Follow up Weakness and Weight Loss)     Ever since ACDF surgery in 03/2023 he has noted food, liquids and pill getting stuck on right throat. Choking and coughing to dislodged things. He has lost a significant amount of weight due to decreased eating given swallowing problems  He does not each much each day per wife. Pt states appetite is good.   Recent swallowing study: Discussion with  Comer Portugal, SLP  Silent  aspiration seen.. Issues swallowing ever since ACDF.  He has had significant weight loss.  Concern for  likely chronic aspiration on recent CT chest.  ( 1. Interim development of patchy consolidation and ground-glass disease most evident at the right middle lobe and right lower lobes with additional areas of mild ground-glass disease and scattered clustered nodularity, findings are suspicious for multifocal infection/pneumonia. Saw Dr. Lenda on 12/2023 for SOB, plan repeat CT, breathing tests and possible ECHO.   Pt plans to do Speech therapy at [facility] for  dysphagia... order signed.   Recommended nutrition referral  for more nutritive  swallowing and ENT eval to eval site of ACDF.  Consider possible eval of alternative means of feeding.  At last OV noted pt was losing balance more often,Speech is more slurred ...  Recommended decreasing Ambien  dose to 5 mg daily, wean off primidone  and zonegran  given likely SE.  Decrease gabapentin .  Today he reports improved strength in  legs, using a cane. Buttock and low back pain from recent fall resolved ( X-rays showed no fracture)  No further falls.  Leg weakness better since reducing sedating meds.  He has stopped gabapentin , primidone , tizanadine He  is sleeping well on Ambien  5 mg   Breathing better at night, no longer snoring.  Has reduce zonegran   dose but had more headaches.... has upcoming follow up with Dr. Oneita.  He reports that O2 sat dropping at home with significant exertion and after eating.  Still coughing and choking with meals,  starting to puree foods and grind pills.  He has a large o2 tank  that he is using off and on ever since   Would need portable oxygen  as cannot move larger unit.  [place].   Constipation.. using ducloax.straining with BMs, firm.  Wt Readings from Last 3 Encounters:  12/29/23 119 lb (54 kg)  12/25/23 122 lb 4 oz (55.5 kg)  12/20/23 122 lb (55.3 kg)    Relevant past medical, surgical, family and social history reviewed and updated as indicated. Interim medical history since our last visit reviewed. Allergies and medications reviewed and updated. Outpatient Medications Prior to Visit  Medication Sig Dispense Refill   aspirin  EC 81 MG tablet Take 81 mg by mouth daily.     atorvastatin  (LIPITOR ) 80 MG tablet TAKE 1 TABLET BY MOUTH DAILY 90 tablet 3   baclofen (LIORESAL)  10 MG tablet Take 10 mg by mouth 2 (two) times daily.     buPROPion  (WELLBUTRIN  XL) 150 MG 24 hr tablet TAKE 3 TABLETS BY MOUTH DAILY 90 tablet 1   busPIRone  (BUSPAR ) 10 MG tablet TAKE 2 TABLETS BY MOUTH 3 TIMES A DAY 180 tablet 5   Cholecalciferol  (VITAMIN D3) 50 MCG (2000 UT) TABS Take 2,000 Units by mouth daily.     clopidogrel  (PLAVIX ) 75 MG tablet TAKE 1 TABLET BY MOUTH DAILY 90 tablet 3   cyanocobalamin  (VITAMIN B12) 1000 MCG tablet Take 1,000 mcg by mouth daily.     denosumab  (PROLIA ) 60 MG/ML SOSY injection Inject 60 mg into the skin every 6 (six) months. Do not send RX, order this from the office      DULoxetine  (CYMBALTA ) 30 MG capsule TAKE THREE CAPSULES BY MOUTH DAILY 90 capsule 5   Erenumab-aooe (AIMOVIG) 140 MG/ML SOAJ 140 mg every 28 (twenty-eight) days.     finasteride  (PROSCAR ) 5 MG tablet Take 5 mg by mouth daily.     levothyroxine  (SYNTHROID ) 88 MCG tablet TAKE 1 TABLET BY MOUTH DAILY BEFORE BREAKFAST 90 tablet 3   losartan  (COZAAR ) 50 MG tablet Take 1 tablet (50 mg total) by mouth daily. 90 tablet 3   Magnesium  250 MG CAPS Take 250 mg by mouth daily.     metoCLOPramide (REGLAN) 10 MG tablet Take 10 mg by mouth every 8 (eight) hours as needed.     metoprolol  succinate (TOPROL  XL) 25 MG 24 hr tablet Take 1 tablet (25 mg total) by mouth daily. 90 tablet 3   nitroGLYCERIN  (NITROSTAT ) 0.4 MG SL tablet Place 1 tablet (0.4 mg total) under the tongue every 5 (five) minutes as needed for chest pain. 10 tablet 1   triamcinolone  cream (KENALOG ) 0.1 % APPLY TOPICALLY 2 TIMES A DAY AS NEEDED .DO NOT APPLY TO FACE, GROIN AND UNDERARMS 454 g 0   zolpidem  (AMBIEN ) 5 MG tablet Take 1 tablet (5 mg total) by mouth at bedtime as needed. 30 tablet 0   zonisamide  (ZONEGRAN ) 25 MG capsule Take 1 capsule (25 mg total) by mouth daily. 4 capsule 0   tiZANidine (ZANAFLEX) 4 MG tablet Take 4 mg by mouth 2 (two) times daily.     gabapentin  (NEURONTIN ) 100 MG capsule Take 1 capsule (100 mg total) by mouth 4 (four) times daily. (Patient not taking: Reported on 12/25/2023) 120 capsule 1   Facility-Administered Medications Prior to Visit  Medication Dose Route Frequency Provider Last Rate Last Admin   denosumab  (PROLIA ) injection 60 mg  60 mg Subcutaneous Once Kerington Hildebrant E, MD       denosumab  (PROLIA ) injection 60 mg  60 mg Subcutaneous Once Ellyssa Zagal E, MD         Per HPI unless specifically indicated in ROS section below Review of Systems  Constitutional:  Negative for fatigue and fever.  HENT:  Negative for ear pain.   Eyes:  Negative for pain.  Respiratory:  Positive for cough and shortness of  breath.   Cardiovascular:  Negative for chest pain, palpitations and leg swelling.  Gastrointestinal:  Negative for abdominal pain.  Genitourinary:  Negative for dysuria.  Musculoskeletal:  Negative for arthralgias.  Neurological:  Positive for weakness and headaches. Negative for syncope and light-headedness.  Psychiatric/Behavioral:  Negative for dysphoric mood.    Objective:  BP 100/60   Pulse 64   Temp (!) 97.5 F (36.4 C) (Temporal)   Ht 6' (1.829 m)   [employer]  119 lb (54 kg)   SpO2 98%   BMI 16.14 kg/m   Wt Readings from Last 3 Encounters:  12/29/23 119 lb (54 kg)  12/25/23 122 lb 4 oz (55.5 kg)  12/20/23 122 lb (55.3 kg)      Physical Exam Vitals reviewed.  Constitutional:      Appearance: He is well-developed. He is cachectic.  HENT:     Head: Normocephalic.     Right Ear: Hearing normal.     Left Ear: Hearing normal.     Nose: Nose normal.  Neck:     Thyroid : No thyroid  mass or thyromegaly.     Vascular: No carotid bruit.     Trachea: Trachea normal.  Cardiovascular:     Rate and Rhythm: Normal rate and regular rhythm.     Pulses: Normal pulses.     Heart sounds: Heart sounds not distant. No murmur heard.    No friction rub. No gallop.     Comments: No peripheral edema Pulmonary:     Effort: Pulmonary effort is normal. No respiratory distress.     Breath sounds: Normal breath sounds.  Abdominal:     General: Abdomen is flat. There is no distension.     Palpations: Abdomen is soft.     Tenderness: There is no abdominal tenderness. There is no right CVA tenderness or left CVA tenderness.  Skin:    General: Skin is warm and dry.     Findings: No rash.  Neurological:     Mental Status: He is alert and oriented to person, place, and time.     Cranial Nerves: Cranial nerves 2-12 are intact.     Motor: Motor function is intact.     Coordination: Coordination abnormal.     Gait: Gait abnormal.  Psychiatric:        Speech: Speech normal.        Behavior:  Behavior normal.        Thought Content: Thought content normal.       Results for orders placed or performed in visit on 12/28/23  Basic Metabolic Panel   Collection Time: 12/28/23  8:11 AM  Result Value Ref Range   Sodium 139 135 - 145 mEq/L   Potassium 4.9 3.5 - 5.1 mEq/L   Chloride 106 96 - 112 mEq/L   CO2 27 19 - 32 mEq/L   Glucose, Bld 90 70 - 99 mg/dL   BUN 27 (H) 6 - 23 mg/dL   Creatinine, Ser 8.55 0.40 - 1.50 mg/dL   GFR 51.79 (L) >39.99 mL/min   Calcium  9.3 8.4 - 10.5 mg/dL   *Note: Due to a large number of results and/or encounters for the requested time period, some results have not been displayed. A complete set of results can be found in Results Review.    Assessment and Plan  Chronic pulmonary aspiration, subsequent encounter Assessment & Plan: Chronic, following ACDF surgery, likely causing hypoxia and chronic lung changes.  Pulmonary continuing to look into lung function tests and plans repeat chest CT. Patient has been referred for speech therapy.  Orders: -     Ambulatory referral to ENT -     Referral to Nutrition and Diabetes Services -     For home use only DME oxygen   Silent aspiration, subsequent encounter Assessment & Plan: High risk for complications.  Orders: -     Ambulatory referral to ENT -     Referral to Nutrition and Diabetes Services  Moderate protein-calorie malnutrition (  HCC) Assessment & Plan: Significant weight loss in last 9 months, approximately 25 pounds, approximately 16% total body weight of starting weight. BMI 16 Patient agreeable to referral to nutritionist for consideration of determining most nutritive food options in setting of chronic pulmonary aspiration and dysphagia.   Orders: -     Ambulatory referral to ENT -     Referral to Nutrition and Diabetes Services  Bilateral leg weakness Assessment & Plan: Acute, significant improvement with reducing over sedating medications.   Weight loss,  non-intentional Assessment & Plan: Spent significant time with patient talking about need for adequate nutrition.  Given likely continued aspiration concern for need of alternative forms of feeding.  Discussed consideration of palliative care referral to establish goals of overall care and to help with  considering options for alternative means of feeding. Patient will speak with speech therapist as well as pulmonary regarding likelihood of control of aspiration with speech therapy.  Will plan follow-up with patient after speech therapy started, nutrition referral as well as ENT referral made.  Orders: -     Referral to Nutrition and Diabetes Services  Chronic respiratory failure with hypoxia, on home oxygen  therapy (HCC) -     For home use only DME oxygen   Chronic constipation Assessment & Plan: Acute intermittent, recommended MiraLAX 17 g 1-2 times daily.   Somnolence Assessment & Plan: Acute, now significantly improved with stopping primidone , gabapentin , decreasing dose of Ambien  and decreasing dose of Zonegran . Asked him to speak with his neurologist about further decreasing of Zonegran  or using a medication that is less sedating for headache.    Dysphagia, unspecified type Assessment & Plan: Chronic, patient agreeable to moving forward with ENT referral to assess ACDF site.  Orders: -     Ambulatory referral to ENT -     Referral to Nutrition and Diabetes Services  Frequent falls -     For home use only DME oxygen     No follow-ups on file.   Greig Ring, MD

## 2023-12-29 NOTE — Assessment & Plan Note (Signed)
 Significant weight loss in last 9 months, approximately 25 pounds, approximately 16% total body weight of starting weight. BMI 16 Patient agreeable to referral to nutritionist for consideration of determining most nutritive food options in setting of chronic pulmonary aspiration and dysphagia.

## 2023-12-29 NOTE — Assessment & Plan Note (Signed)
 Acute, significant improvement with reducing over sedating medications.

## 2023-12-29 NOTE — Assessment & Plan Note (Signed)
 High risk for complications.

## 2023-12-29 NOTE — Telephone Encounter (Signed)
 Last office visit 12/15/23 for Fall/Tailbone pain.  Last refilled 08/03/2023 for 454 g with no refills.  Next Appt Today 12/29/2023

## 2023-12-29 NOTE — Assessment & Plan Note (Signed)
 Chronic, patient agreeable to moving forward with ENT referral to assess ACDF site.

## 2023-12-29 NOTE — Assessment & Plan Note (Addendum)
 Chronic, following ACDF surgery, likely causing hypoxia and chronic lung changes.  Pulmonary continuing to look into lung function tests and plans repeat chest CT. Patient has been referred for speech therapy.

## 2023-12-29 NOTE — Patient Instructions (Signed)
 Start miralax 17 gm daily to BID.

## 2023-12-29 NOTE — Assessment & Plan Note (Signed)
 Acute intermittent, recommended MiraLAX 17 g 1-2 times daily.

## 2023-12-29 NOTE — Progress Notes (Signed)
 O2 at rest on room air -  100% O2 with exertion on room air -  86% O2 at rest on 2L - 100% O2 with exertion 2L - 100%

## 2023-12-29 NOTE — Assessment & Plan Note (Signed)
 Spent significant time with patient talking about need for adequate nutrition.  Given likely continued aspiration concern for need of alternative forms of feeding.  Discussed consideration of palliative care referral to establish goals of overall care and to help with  considering options for alternative means of feeding. Patient will speak with speech therapist as well as pulmonary regarding likelihood of control of aspiration with speech therapy.  Will plan follow-up with patient after speech therapy started, nutrition referral as well as ENT referral made.

## 2024-01-01 ENCOUNTER — Encounter: Payer: Self-pay | Admitting: Family Medicine

## 2024-01-01 ENCOUNTER — Ambulatory Visit: Admitting: Speech Pathology

## 2024-01-01 ENCOUNTER — Ambulatory Visit: Admitting: Physical Therapy

## 2024-01-01 DIAGNOSIS — R1312 Dysphagia, oropharyngeal phase: Secondary | ICD-10-CM | POA: Diagnosis not present

## 2024-01-01 DIAGNOSIS — M542 Cervicalgia: Secondary | ICD-10-CM | POA: Diagnosis not present

## 2024-01-01 DIAGNOSIS — M546 Pain in thoracic spine: Secondary | ICD-10-CM | POA: Diagnosis not present

## 2024-01-01 NOTE — Therapy (Unsigned)
 OUTPATIENT SPEECH LANGUAGE PATHOLOGY  SWALLOW EVALUATION   Patient Name: Shawn Lam MRN: [id number] DOB:[birth year], 73 y.o., male Today's Date: 01/01/2024  PCP: Greig Ring, MD REFERRING PROVIDER: Greig Ring, MD   End of Session - 01/01/24 1253     Visit Number 1    Number of Visits 25    Date for Recertification  03/25/24    Authorization Type Medicare Part A    Progress Note Due on Visit 10    SLP Start Time 1145    SLP Stop Time  1240    SLP Time Calculation (min) 55 min    Activity Tolerance Patient tolerated treatment well          Past Medical History:  Diagnosis Date   Actinic keratosis 02/04/2021   right lateral neck   Anemia    Anxiety    Asymptomatic LV dysfunction    50-55% by echo 06/2016   BCC (basal cell carcinoma of skin) 08/05/2020   left neck infra auricular - nodular pattern - treated with [facility] 09/17/2020   Berger's disease    CAD (coronary artery disease) 06/2005   PCI of LAD and RCA   Depression    Dilated aortic root (HCC)    4cm at sinus of Valsalva   Hematuria    Hyperlipidemia    Hypothyroidism    Insomnia    Left ureteral stone    Orthostatic hypotension    negative tilt table   OSA (obstructive sleep apnea)    moderate with AHI 17/hr, uses CPAP nightly(01/15/19 - has Inspire implant)   PVC's (premature ventricular contractions) 07/07/2016   Renal disorder    PT IS SEEING DR. BETSEY- NEPHROLOGIST   Rosacea    Urticaria    Past Surgical History:  Procedure Laterality Date   ANTERIOR CERVICAL DECOMP/DISCECTOMY FUSION N/A 03/13/2023   Procedure: CERVICAL FOUR-FIVE, CERVICAL FIVE-SIX ANTERIOR CERVICAL DISCECTOMY/DECOMPRESSION FUSION;  Surgeon: Onetha Kuba, MD;  Location: [facility] OR;  Service: Neurosurgery;  Laterality: N/A;  3C   CARDIAC CATHETERIZATION  09/06/2013   [facility]   CARDIAC CATHETERIZATION     COLONOSCOPY WITH PROPOFOL  N/A 01/21/2019   Procedure: COLONOSCOPY WITH PROPOFOL ;  Surgeon: Jinny Carmine, MD;  Location: [facility];  Service: Endoscopy;  Laterality: N/A;  sleep apnea   COLONOSCOPY WITH PROPOFOL  N/A 10/19/2021   Procedure: COLONOSCOPY WITH PROPOFOL ;  Surgeon: Jinny Carmine, MD;  Location: [facility] ENDOSCOPY;  Service: Endoscopy;  Laterality: N/A;   CORONARY ANGIOPLASTY  2004   STENT PLACEMENT   CYSTOSCOPY WITH RETROGRADE PYELOGRAM, URETEROSCOPY AND STENT PLACEMENT Left 03/19/2014   Procedure: CYSTOSCOPY WITH RETROGRADE PYELOGRAM, URETEROSCOPY AND STENT PLACEMENT;  Surgeon: Mickey Ricardo KATHEE Alvaro, MD;  Location: [facility] ORS;  Service: Urology;  Laterality: Left;   CYSTOSCOPY WITH RETROGRADE PYELOGRAM, URETEROSCOPY AND STENT PLACEMENT Bilateral 05/20/2015   Procedure:  CYSTOSCOPY WITH BILATERAL RETROGRADE PYELOGRAM,RIGHT  DIAGNOSTIC URETEROSCOPY ,LEFT URETEROSCOPY WITH HOLMIUM LASER  AND BILATERAL  STENT PLACEMENT ;  Surgeon: Ricardo Alvaro, MD;  Location: [facility] ORS;  Service: Urology;  Laterality: Bilateral;   DRUG INDUCED ENDOSCOPY N/A 10/20/2017   Procedure: DRUG INDUCED SLEEP ENDOSCOPY;  Surgeon: Carlie Clark, MD;  Location: [facility];  Service: ENT;  Laterality: N/A;   EP IMPLANTABLE DEVICE N/A 05/13/2016   Procedure: Loop Recorder Insertion;  Surgeon: Elspeth JAYSON Sage, MD;  Location: [facility] INVASIVE [facility];  Service: Cardiovascular;  Laterality: N/A;   ESOPHAGOGASTRODUODENOSCOPY N/A 10/19/2021   Procedure: ESOPHAGOGASTRODUODENOSCOPY (EGD);  Surgeon: Jinny Carmine, MD;  Location: [facility]  ENDOSCOPY;  Service: Endoscopy;  Laterality: N/A;   ESOPHAGOGASTRODUODENOSCOPY (EGD) WITH PROPOFOL  N/A 11/30/2018   Procedure: ESOPHAGOGASTRODUODENOSCOPY (EGD) WITH PROPOFOL ;  Surgeon: Therisa Bi, MD;  Location: [facility] ENDOSCOPY;  Service: Gastroenterology;  Laterality: N/A;   ESOPHAGOGASTRODUODENOSCOPY (EGD) WITH PROPOFOL  N/A 12/01/2018   Procedure: ESOPHAGOGASTRODUODENOSCOPY (EGD) WITH PROPOFOL ;  Surgeon: Jinny Carmine, MD;  Location: [facility] ENDOSCOPY;  Service: Endoscopy;  Laterality: N/A;   HOLMIUM LASER APPLICATION Bilateral 05/20/2015    Procedure: HOLMIUM LASER APPLICATION;  Surgeon: Ricardo Likens, MD;  Location: [facility];  Service: Urology;  Laterality: Bilateral;   IMPLIMENTATION OF A HYPOGLOSSAL NERVE STIMULATOR  12/08/2017   OSA    LEFT HEART CATHETERIZATION WITH CORONARY ANGIOGRAM N/A 09/06/2013   Procedure: LEFT HEART CATHETERIZATION WITH CORONARY ANGIOGRAM;  Surgeon: Victory LELON Claudene DOUGLAS, MD;  Location: [facility] CATH LAB;  Service: Cardiovascular;  Laterality: N/A;   LEFT KNEE CAP  SURGERY ABOUT 40 YRS AGO  ? 1970     STONE EXTRACTION WITH BASKET Left 03/19/2014   Procedure: STONE EXTRACTION WITH BASKET;  Surgeon: Mickey Ricardo KATHEE Likens, MD;  Location: [facility] ORS;  Service: Urology;  Laterality: Left;   TRANSURETHRAL RESECTION OF PROSTATE N/A 08/25/2021   Procedure: TRANSURETHRAL RESECTION OF THE PROSTATE (TURP);  Surgeon: Likens Ricardo, MD;  Location: [facility] OR[facility];  Service: Urology;  Laterality: N/A;  1 HR   Patient Active Problem List   Diagnosis Date Noted   Chronic pulmonary aspiration 12/29/2023   Silent aspiration 12/29/2023   Moderate protein-calorie malnutrition (HCC) 12/29/2023   Bilateral leg weakness 12/29/2023   Chronic respiratory failure with hypoxia, on home oxygen  therapy (HCC) 12/29/2023   Chronic constipation 12/29/2023   Somnolence 12/29/2023   Frequent falls 12/29/2023   Feeding difficulties, unspecified 12/20/2023   Shortness of breath 12/20/2023   Abnormal CT of the chest 10/10/2023   Elevated blood sugar 06/30/2023   Carotid stenosis 05/11/2023   Spinal stenosis in cervical region 03/13/2023   Osteoarthritis of spine with radiculopathy, cervical region 07/14/2022   Cervicogenic headache 11/30/2021   Chronic neck pain 11/30/2021   Weight loss, non-intentional    Dysphagia    Polyp of colon    Prostatic hyperplasia 08/25/2021   Chronic insomnia 03/19/2020   Peripheral neuropathy 03/19/2020   Iron deficiency anemia 01/06/2020   Allergic dermatitis 07/08/2019   Osteoarthritis of left knee 02/26/2019    History of colonic polyps    History of duodenal ulcer    CKD (chronic kidney disease), stage IIIa    Bilateral chronic knee pain 11/09/2018   Epistaxis 10/02/2018   Subjective tinnitus, bilateral 10/02/2018   Chronic pain of right thumb 02/22/2018   Acute bilateral thoracic back pain 01/23/2018   Osteoporosis without current pathological fracture 02/10/2017   Hypothyroidism 11/02/2016   Thoracic compression fracture (HCC) 09/06/2016   PVC's (premature ventricular contractions) 07/07/2016   Hypoalbuminemia 06/28/2016   IgA nephropathy 06/07/2016   MDD (major depressive disorder), recurrent episode, moderate (HCC) 11/20/2015   OSA (obstructive sleep apnea) 02/05/2014   Orthostatic hypotension 02/07/2013   Scotoma involving central area 01/29/2013   Optic neuropathy 01/28/2013   HEMATURIA UNSPECIFIED 01/21/2010   HLD (hyperlipidemia) 07/05/2006   Generalized anxiety disorder 07/05/2006   CAD (coronary artery disease) 07/05/2006   GERD 07/05/2006    ONSET DATE: 12/*2024 following ACDF; Date of referral 12/28/2023  REFERRING DIAG:  T17.908D (ICD-10-CM) - Chronic pulmonary aspiration, subsequent encounter  T17.900D (ICD-10-CM) - Silent aspiration, subsequent encounter  E44.0 (ICD-10-CM) - Moderate protein-calorie malnutrition  R13.10 (ICD-10-CM) - Dysphagia, unspecified  type    THERAPY DIAG:  Oropharyngeal dysphagia  Rationale for Evaluation and Treatment Rehabilitation  SUBJECTIVE:   SUBJECTIVE STATEMENT: Pt pleasant, appears very thin, pt eager, engaged, accompanied by his wife Pt accompanied by: self and significant other  PERTINENT HISTORY and DIAGNOSTIC FINDINGS:  Pt is a 73 yo male w/ ACDF surgery 03/2023.  PMH: GERD(Not on a PPI per chart), Actinic keratosis (02/04/2021), Anemia, Anxiety, Asymptomatic LV dysfunction, BCC (basal cell carcinoma of skin) (08/05/2020), Berger's disease, CAD (coronary artery disease) (06/2005), resting tremor, Depression, Dilated aortic  root (HCC), Hematuria, Hyperlipidemia, Hypothyroidism, Insomnia, Left ureteral stone, Orthostatic hypotension, OSA (obstructive sleep apnea), PVC's (premature ventricular contractions) (07/07/2016), Renal disorder, Rosacea, and Urticaria.  Pt is followed by Cardiology for Coronary Artery Stenosis currently; also followed for Migraines.     EGD in 2023 revealed Gastritis.      CT of Chest 10/2023: 1. Interim development of patchy consolidation and ground-glass   disease most evident at the right middle lobe and right lower lobes   with additional areas of mild ground-glass disease and scattered   clustered nodularity, findings are suspicious for multifocal   infection/pneumonia. Imaging follow-up to resolution is recommended.   2. Emphysema.   3. Aortic atherosclerosis.            Dysphagia - reported by pt as Chronic, ever since neck surgery in December 2024. Pt was seen in the emergency room s/p Fall, notes detailing that he slipped on soap in the shower, fell backwards on right side hit head and neck against shower seat, possible brief loss of consciousness, per chart.  ACDF 03/2023 per chart.  Since the neck surgery in 03/2023, he has noted food, liquids and pill getting stuck on the right side of my throat more.  I used to cough a lot more but not as much now.  I cough up what gets stuck..  He has lost significant weight per chart notes due to decreased eating.  Pt endorsed using his home O2 when he feels SOB.   MBSS 12/26/2023 Patient appears to present w/ Chronic, moderate-severe Pharyngeal phase dysphagia per this assessment today. It appeared there was some impact from changes in the Cervical Esophagus s/p ACDF surgery(03/2023 per chart notes) including structural/tissue changes in/aournd the CP segment and cervical esophagus. Pt with intermittent silent aspiration of thin liquids.   Recommend continue current diet of easy to eat foods at home; thin liquids via cup- at this time. Oral care  Before any oral intake. Swallow Strategies Recommended: No straws, moistened foods, SMALL bites/sips, Head Turn RIGHT w/ slight chin tuck, multiple swallows to aid clearing, AND strong Cough/throat clear b/t bites/sips and at end of meal.  D/t the Chronicity of pt's Pharyngeal phase Dysphagia w/ weight loss and pulmonary impact per recent Chest CT, pt and Family may need to discuss alternative means of nutrition/hydration as support w/ his Medical Team/PCP, while seeking swallow therapy.  PAIN:  Are you having pain? No  FALLS: Has patient fallen in last 6 months?  See PT evaluation for details  LIVING ENVIRONMENT: Lives with: lives with their spouse Lives in: House/apartment  PLOF:  Level of assistance: Independent with ADLs, Independent with IADLs Employment: Retired   PATIENT GOALS  to improve swallow function, nutrition and hydration, decrease aspiration risks  OBJECTIVE:  RECOMMENDATIONS FROM OBJECTIVE SWALLOW STUDY (MBSS/FEES):  12/26/2023 Objective swallow impairments: Patient appears to present w/ Chronic, moderate-severe Pharyngeal phase dysphagia per this assessment today. It appeared there was some impact from  changes in the Cervical Esophagus s/p ACDF surgery(03/2023 per chart notes) including structural/tissue changes in/aournd the CP segment and cervical esophagus.   Recommend continue current diet of easy to eat foods at home; thin liquids via cup- at this time. Oral care Before any oral intake. Objective recommended compensations: Swallow Strategies Recommended: No straws, moistened foods, SMALL bites/sips, Head Turn RIGHT w/ slight chin tuck, multiple swallows to aid clearing, AND strong Cough/throat clear b/t bites/sips and at end of meal.   COGNITION: Overall cognitive status: Within functional limits for tasks assessed  ORAL MOTOR EXAMINATION Facial : WFL Lingual: WFL Velum: WFL Mandible: WFL Cough: WFL Voice: WFL, Hoarse   CLINICAL SWALLOW ASSESSMENT:    Current diet: His wife reports I can't seem to figure out what he is able to eat Dentition: adequate natural dentition Feeding: able to feed self Consistencies tested: unable to d/t time constraints Aspiration risk factors:ACDF  Overall aspiration risk:Severe and Risk for inadequate nutrition/hydration Diet Recommendations: Dysphagia 3 (mechanical soft) and thin liquids Precautions:Minimize environmental distractions, Slow rate, Small sips/bites, Seated upright 90 degrees, Remain upright for at least 30 minutes after meals, No straws, Multiple dry swallows after each bite, Effortful swallow, Chin tuck, and Head turn right Supervision: Patient able to feed self Oral care recommendations:Oral care BID and Oral care before and after PO Follow-up recommendations: Therapy as outlined in treatment plan below, Recommend ENT referral, and Other: RD and potential PEG information     PATIENT REPORTED OUTCOME MEASURES (PROM): To be completed over the next 3 sessions  TODAY'S TREATMENT:  Skilled education provided on phases of swallow, phases of impairment, aspiration, compensatory swallow strategies   PATIENT EDUCATION: Education details: ST POc, repeat MBSS in 8 to 12 weeks Person educated: Patient and Spouse Education method: Explanation and Handouts Education comprehension: needs further education  HOME EXERCISE PROGRAM:     Implement aspiration strategies, compensatory swallow strateges   GOALS: Goals reviewed with patient? Yes  SHORT TERM GOALS: Target date: 10 sessions  With Mod I, pt will perform compensatory swallow strategies (chin tuck, multiple swallows, head turn) to eliminate s/s of aspiration when consuming least restrictive diet. Baseline: Goal status: INITIAL  2.  With Min A, pt will complete (supraglottic, swallow, Mednelson maneuver, effortful swallow) to improve oral motor weakness, tongue base retraction, hyolarnygeal excursion, airway protection and/or clearance  of the bolus through the pharynx. Baseline:  Goal status: INITIAL  3. Pt will demonstrate overt s/s of aspiration when consuming ice chips.   Baseline:  Goal status: INITIAL   LONG TERM GOALS: Target date: 03/25/2024  With Mod I, pt will demonstrate the ability to adequately self-monitor swallowing skills and perform appropriate compensatory techniques to reduce s/s of aspiration and to safely consume least restrictive diet.     Baseline:  Goal status: INITIAL  2.  Pt will participate in repeat instrumental swallow evaluation to assess least restrictive diet.  Baseline:  Goal status: INITIAL   ASSESSMENT:  CLINICAL IMPRESSION: Patient is a 73 y.o. male who was seen today for a clinical swallow evaluation d/t severe pharyngeal phase dysphagia following ACDF (03/2023).    OBJECTIVE IMPAIRMENTS include dysphagia. These impairments are limiting patient from safety when swallowing. Factors affecting potential to achieve goals and functional outcome are severity of impairments and malnutrition. Patient will benefit from skilled SLP services to address above impairments and improve overall function.  REHAB POTENTIAL: Good  PLAN: SLP FREQUENCY: 1-2x/week  SLP DURATION: 12 weeks  PLANNED INTERVENTIONS: Aspiration precaution training, Pharyngeal strengthening  exercises, Diet toleration management , Oral motor exercises, SLP instruction and feedback, Compensatory strategies, and Patient/family education  Hassell Roys, SLP Student Clinician   Happi B. Rubbie, M.S., CCC-SLP, Tree surgeon Certified Brain Injury Specialist [facility]  [facility] Rehabilitation Services Office [phone] Ascom [phone] Fax [phone]

## 2024-01-02 ENCOUNTER — Ambulatory Visit

## 2024-01-02 DIAGNOSIS — G518 Other disorders of facial nerve: Secondary | ICD-10-CM | POA: Diagnosis not present

## 2024-01-02 DIAGNOSIS — G43719 Chronic migraine without aura, intractable, without status migrainosus: Secondary | ICD-10-CM | POA: Diagnosis not present

## 2024-01-02 DIAGNOSIS — M542 Cervicalgia: Secondary | ICD-10-CM | POA: Diagnosis not present

## 2024-01-02 DIAGNOSIS — M791 Myalgia, unspecified site: Secondary | ICD-10-CM | POA: Diagnosis not present

## 2024-01-02 DIAGNOSIS — M810 Age-related osteoporosis without current pathological fracture: Secondary | ICD-10-CM

## 2024-01-02 MED ORDER — DENOSUMAB 60 MG/ML ~~LOC~~ SOSY: 60.0000 mg | PREFILLED_SYRINGE | SUBCUTANEOUS | Status: DC

## 2024-01-02 NOTE — Progress Notes (Signed)
 Per orders of Dr. Greig Ring, injection of Prolia   given by Nellie Hummer in left SQ. Patient tolerated injection well. Patient will make appointment for 6 month.

## 2024-01-03 ENCOUNTER — Ambulatory Visit: Admitting: Physical Therapy

## 2024-01-03 DIAGNOSIS — W19XXXA Unspecified fall, initial encounter: Secondary | ICD-10-CM | POA: Insufficient documentation

## 2024-01-03 DIAGNOSIS — S7001XA Contusion of right hip, initial encounter: Secondary | ICD-10-CM | POA: Insufficient documentation

## 2024-01-03 NOTE — Assessment & Plan Note (Signed)
 Acute, worsening, most likely multifactorial. In part due to over sedating medications Wean off  zonegran ... cannot cut this.SABRA decrease to 25 mg daily x 3-4 days then stop.  Then cut primidone  in half and take twice daily x 3-4 days then stop.  Wean off gabapentin  by decreasing 1 tablet a day  every 3 days.  Do not take tizanidine.  Decreased Ambien  to 1/2 tablet at night.

## 2024-01-03 NOTE — Assessment & Plan Note (Signed)
 Acute, after fall in patient with osteoporosis on Prolia .  Will evaluate with x-ray of pelvis and bilateral hips.

## 2024-01-03 NOTE — Assessment & Plan Note (Addendum)
 Significant increase in loss of balance and weakness in legs overall. Of note patient on multiple sedating medications. Severe weight loss in the last several weeks, decreased appetite given difficulty swallowing.  DME order for rolling walker provided.

## 2024-01-04 ENCOUNTER — Ambulatory Visit: Admitting: Speech Pathology

## 2024-01-04 ENCOUNTER — Ambulatory Visit

## 2024-01-04 DIAGNOSIS — M546 Pain in thoracic spine: Secondary | ICD-10-CM

## 2024-01-04 DIAGNOSIS — R1312 Dysphagia, oropharyngeal phase: Secondary | ICD-10-CM

## 2024-01-04 DIAGNOSIS — M542 Cervicalgia: Secondary | ICD-10-CM | POA: Diagnosis not present

## 2024-01-04 NOTE — Therapy (Signed)
 OUTPATIENT SPEECH LANGUAGE PATHOLOGY  SWALLOW TREATMENT   Patient Name: Shawn Lam MRN: [id number] DOB:August 01, 1950, 73 y.o., male Today's Date: 01/04/2024  PCP: Greig Ring, MD REFERRING PROVIDER: Greig Ring, MD   End of Session - 01/04/24 0926     Visit Number 2    Number of Visits 25    Date for Recertification  03/25/24    Authorization Type Medicare Part A    Progress Note Due on Visit 10    SLP Start Time 0930    SLP Stop Time  1000    SLP Time Calculation (min) 30 min    Activity Tolerance Patient tolerated treatment well          Past Medical History:  Diagnosis Date   Actinic keratosis 02/04/2021   right lateral neck   Anemia    Anxiety    Asymptomatic LV dysfunction    50-55% by echo 06/2016   BCC (basal cell carcinoma of skin) 08/05/2020   left neck infra auricular - nodular pattern - treated with [facility] 09/17/2020   Berger's disease    CAD (coronary artery disease) 06/2005   PCI of LAD and RCA   Depression    Dilated aortic root    4cm at sinus of Valsalva   Hematuria    Hyperlipidemia    Hypothyroidism    Insomnia    Left ureteral stone    Orthostatic hypotension    negative tilt table   OSA (obstructive sleep apnea)    moderate with AHI 17/hr, uses CPAP nightly(01/15/19 - has Inspire implant)   PVC's (premature ventricular contractions) 07/07/2016   Renal disorder    PT IS SEEING DR. BETSEY- NEPHROLOGIST   Rosacea    Urticaria    Past Surgical History:  Procedure Laterality Date   ANTERIOR CERVICAL DECOMP/DISCECTOMY FUSION N/A 03/13/2023   Procedure: CERVICAL FOUR-FIVE, CERVICAL FIVE-SIX ANTERIOR CERVICAL DISCECTOMY/DECOMPRESSION FUSION;  Surgeon: Onetha Kuba, MD;  Location: [facility] OR;  Service: Neurosurgery;  Laterality: N/A;  3C   CARDIAC CATHETERIZATION  09/06/2013   [facility]   CARDIAC CATHETERIZATION     COLONOSCOPY WITH PROPOFOL  N/A 01/21/2019   Procedure: COLONOSCOPY WITH PROPOFOL ;  Surgeon: Jinny Carmine, MD;  Location: [facility];   Service: Endoscopy;  Laterality: N/A;  sleep apnea   COLONOSCOPY WITH PROPOFOL  N/A 10/19/2021   Procedure: COLONOSCOPY WITH PROPOFOL ;  Surgeon: Jinny Carmine, MD;  Location: [facility] ENDOSCOPY;  Service: Endoscopy;  Laterality: N/A;   CORONARY ANGIOPLASTY  2004   STENT PLACEMENT   CYSTOSCOPY WITH RETROGRADE PYELOGRAM, URETEROSCOPY AND STENT PLACEMENT Left 03/19/2014   Procedure: CYSTOSCOPY WITH RETROGRADE PYELOGRAM, URETEROSCOPY AND STENT PLACEMENT;  Surgeon: Mickey Ricardo KATHEE Alvaro, MD;  Location: [facility] ORS;  Service: Urology;  Laterality: Left;   CYSTOSCOPY WITH RETROGRADE PYELOGRAM, URETEROSCOPY AND STENT PLACEMENT Bilateral 05/20/2015   Procedure:  CYSTOSCOPY WITH BILATERAL RETROGRADE PYELOGRAM,RIGHT  DIAGNOSTIC URETEROSCOPY ,LEFT URETEROSCOPY WITH HOLMIUM LASER  AND BILATERAL  STENT PLACEMENT ;  Surgeon: Ricardo Alvaro, MD;  Location: [facility] ORS;  Service: Urology;  Laterality: Bilateral;   DRUG INDUCED ENDOSCOPY N/A 10/20/2017   Procedure: DRUG INDUCED SLEEP ENDOSCOPY;  Surgeon: Carlie Clark, MD;  Location: [facility];  Service: ENT;  Laterality: N/A;   EP IMPLANTABLE DEVICE N/A 05/13/2016   Procedure: Loop Recorder Insertion;  Surgeon: Elspeth JAYSON Sage, MD;  Location: [facility] INVASIVE CV LAB;  Service: Cardiovascular;  Laterality: N/A;   ESOPHAGOGASTRODUODENOSCOPY N/A 10/19/2021   Procedure: ESOPHAGOGASTRODUODENOSCOPY (EGD);  Surgeon: Jinny Carmine, MD;  Location: [facility] ENDOSCOPY;  Service: Endoscopy;  Laterality: N/A;   ESOPHAGOGASTRODUODENOSCOPY (EGD) WITH PROPOFOL  N/A 11/30/2018   Procedure: ESOPHAGOGASTRODUODENOSCOPY (EGD) WITH PROPOFOL ;  Surgeon: Therisa Bi, MD;  Location: [facility] ENDOSCOPY;  Service: Gastroenterology;  Laterality: N/A;   ESOPHAGOGASTRODUODENOSCOPY (EGD) WITH PROPOFOL  N/A 12/01/2018   Procedure: ESOPHAGOGASTRODUODENOSCOPY (EGD) WITH PROPOFOL ;  Surgeon: Jinny Carmine, MD;  Location: [facility] ENDOSCOPY;  Service: Endoscopy;  Laterality: N/A;   HOLMIUM LASER APPLICATION Bilateral 05/20/2015    Procedure: HOLMIUM LASER APPLICATION;  Surgeon: Ricardo Likens, MD;  Location: [facility]S;  Service: Urology;  Laterality: Bilateral;   IMPLIMENTATION OF A HYPOGLOSSAL NERVE STIMULATOR  12/08/2017   OSA    LEFT HEART CATHETERIZATION WITH CORONARY ANGIOGRAM N/A 09/06/2013   Procedure: LEFT HEART CATHETERIZATION WITH CORONARY ANGIOGRAM;  Surgeon: Victory LELON Claudene DOUGLAS, MD;  Location: [facility] CATH LAB;  Service: Cardiovascular;  Laterality: N/A;   LEFT KNEE CAP  SURGERY ABOUT 40 YRS AGO  ? 1970     STONE EXTRACTION WITH BASKET Left 03/19/2014   Procedure: STONE EXTRACTION WITH BASKET;  Surgeon: Mickey Ricardo KATHEE Likens, MD;  Location: [facility] ORS;  Service: Urology;  Laterality: Left;   TRANSURETHRAL RESECTION OF PROSTATE N/A 08/25/2021   Procedure: TRANSURETHRAL RESECTION OF THE PROSTATE (TURP);  Surgeon: Likens Ricardo, MD;  Location: [facility] ORS;  Service: Urology;  Laterality: N/A;  1 HR   Patient Active Problem List   Diagnosis Date Noted   Fall 01/03/2024   Hematoma of right hip 01/03/2024   Chronic pulmonary aspiration 12/29/2023   Silent aspiration 12/29/2023   Moderate protein-calorie malnutrition 12/29/2023   Bilateral leg weakness 12/29/2023   Chronic respiratory failure with hypoxia, on home oxygen  therapy (HCC) 12/29/2023   Chronic constipation 12/29/2023   Somnolence 12/29/2023   Frequent falls 12/29/2023   Feeding difficulties, unspecified 12/20/2023   Shortness of breath 12/20/2023   Abnormal CT of the chest 10/10/2023   Elevated blood sugar 06/30/2023   Carotid stenosis 05/11/2023   Spinal stenosis in cervical region 03/13/2023   Osteoarthritis of spine with radiculopathy, cervical region 07/14/2022   Cervicogenic headache 11/30/2021   Chronic neck pain 11/30/2021   Weight loss, non-intentional    Dysphagia    Polyp of colon    Prostatic hyperplasia 08/25/2021   Chronic insomnia 03/19/2020   Peripheral neuropathy 03/19/2020   Iron deficiency anemia 01/06/2020   Allergic dermatitis 07/08/2019    Osteoarthritis of left knee 02/26/2019   History of colonic polyps    History of duodenal ulcer    CKD (chronic kidney disease), stage IIIa    Bilateral chronic knee pain 11/09/2018   Epistaxis 10/02/2018   Subjective tinnitus, bilateral 10/02/2018   Chronic pain of right thumb 02/22/2018   Acute bilateral thoracic back pain 01/23/2018   Osteoporosis without current pathological fracture 02/10/2017   Hypothyroidism 11/02/2016   Thoracic compression fracture (HCC) 09/06/2016   PVC's (premature ventricular contractions) 07/07/2016   Hypoalbuminemia 06/28/2016   IgA nephropathy 06/07/2016   Acute buttock pain 05/23/2016   MDD (major depressive disorder), recurrent episode, moderate (HCC) 11/20/2015   OSA (obstructive sleep apnea) 02/05/2014   Orthostatic hypotension 02/07/2013   Scotoma involving central area 01/29/2013   Optic neuropathy 01/28/2013   HEMATURIA UNSPECIFIED 01/21/2010   HLD (hyperlipidemia) 07/05/2006   Generalized anxiety disorder 07/05/2006   CAD (coronary artery disease) 07/05/2006   GERD 07/05/2006    ONSET DATE: 12/*2024 following ACDF; Date of referral 12/28/2023  REFERRING DIAG:  T17.908D (ICD-10-CM) - Chronic pulmonary aspiration, subsequent encounter  T17.900D (ICD-10-CM) - Silent aspiration, subsequent  encounter  E44.0 (ICD-10-CM) - Moderate protein-calorie malnutrition  R13.10 (ICD-10-CM) - Dysphagia, unspecified type    THERAPY DIAG:  Oropharyngeal dysphagia  Rationale for Evaluation and Treatment Rehabilitation  SUBJECTIVE:   PERTINENT HISTORY and DIAGNOSTIC FINDINGS:  Pt is a 73 yo male w/ ACDF surgery 03/2023.  PMH: GERD(Not on a PPI per chart), Actinic keratosis (02/04/2021), Anemia, Anxiety, Asymptomatic LV dysfunction, BCC (basal cell carcinoma of skin) (08/05/2020), Berger's disease, CAD (coronary artery disease) (06/2005), resting tremor, Depression, Dilated aortic root (HCC), Hematuria, Hyperlipidemia, Hypothyroidism, Insomnia, Left  ureteral stone, Orthostatic hypotension, OSA (obstructive sleep apnea), PVC's (premature ventricular contractions) (07/07/2016), Renal disorder, Rosacea, and Urticaria.  Pt is followed by Cardiology for Coronary Artery Stenosis currently; also followed for Migraines.     EGD in 2023 revealed Gastritis.      CT of Chest 10/2023: 1. Interim development of patchy consolidation and ground-glass   disease most evident at the right middle lobe and right lower lobes   with additional areas of mild ground-glass disease and scattered   clustered nodularity, findings are suspicious for multifocal   infection/pneumonia. Imaging follow-up to resolution is recommended.   2. Emphysema.   3. Aortic atherosclerosis.            Dysphagia - reported by pt as Chronic, ever since neck surgery in December 2024. Pt was seen in the emergency room s/p Fall, notes detailing that he slipped on soap in the shower, fell backwards on right side hit head and neck against shower seat, possible brief loss of consciousness, per chart.  ACDF 03/2023 per chart.  Since the neck surgery in 03/2023, he has noted food, liquids and pill getting stuck on the right side of my throat more.  I used to cough a lot more but not as much now.  I cough up what gets stuck..  He has lost significant weight per chart notes due to decreased eating.  Pt endorsed using his home O2 when he feels SOB.   MBSS 12/26/2023 Patient appears to present w/ Chronic, moderate-severe Pharyngeal phase dysphagia per this assessment today. It appeared there was some impact from changes in the Cervical Esophagus s/p ACDF surgery(03/2023 per chart notes) including structural/tissue changes in/aournd the CP segment and cervical esophagus. Pt with intermittent silent aspiration of thin liquids.   Recommend continue current diet of easy to eat foods at home; thin liquids via cup- at this time. Oral care Before any oral intake. Swallow Strategies Recommended: No straws,  moistened foods, SMALL bites/sips, Head Turn RIGHT w/ slight chin tuck, multiple swallows to aid clearing, AND strong Cough/throat clear b/t bites/sips and at end of meal.  D/t the Chronicity of pt's Pharyngeal phase Dysphagia w/ weight loss and pulmonary impact per recent Chest CT, pt and Family may need to discuss alternative means of nutrition/hydration as support w/ his Medical Team/PCP, while seeking swallow therapy.  PAIN:  Are you having pain? No  FALLS: Has patient fallen in last 6 months?  See PT evaluation for details  LIVING ENVIRONMENT: Lives with: lives with their spouse Lives in: House/apartment  PLOF:  Level of assistance: Independent with ADLs, Independent with IADLs Employment: Retired   PATIENT GOALS  to improve swallow function, nutrition and hydration, decrease aspiration risks  SUBJECTIVE STATEMENT: Pt eager, appears very thin, requires additional cues to complete exercises, reports some cognitive slowing since decreased intake Pt accompanied by: self    OBJECTIVE:   TODAY'S TREATMENT:  Skilled treatment session focused on pt's dysphagia goals. SLP  facilitated the session by providing the following interventions:  Skilled verbal, visual and written instructions provided on pharyngeal strengthening exercises (as below).  Masako - pt able to attempt Masako correctly - pt not able to complete swallow despite 6 attempts - recommend pt continue exercises 3xday for 3 attempts each time  Chin Tuck Against Resistance (CTAR) - pt able to attempt CTAR correctly, pt reports inability to swallow during exercise feels like I am holding my breath but not swallowing  Skilled observation was provided on pt consuming thin liquids via cup while using right head turn with chin tuck. Pt with overt s/s concerning for aspiration c/b multiple swallows immediate throat clearing and surpressed cough suspect delayed swallow initiation and reduced airway protection.   Of note pt  reports cognitive slowing since weight loss/malnutrition.      PATIENT REPORTED OUTCOME MEASURES (PROM):  EATING ASSESSMENT TOOL (EAT-10)   The patient was asked to rate to what extent the following statements are problematic on a scale of 0-4. 0 = No problem; 4 = Severe problem. A total score of 3 or higher is considered abnormal.  1.) My swallowing problem has caused me to lose weight. 4  2.) My swallowing problem interferes with my ability to go out for meals. 4  3.) Swallowing liquids takes extra effort. 4  4.) Swallowing solids takes extra effort. 3  5.) Swallowing pills takes extra effort. 4  6.) Swallowing is painful. 0 7.) The pleasure of eating is affected by my swallowing. 4  8.) When I swallow food sticks in my throat. 4  9.) I cough when I eat. 3 10.) Swallowing is stressful. 4    TOTAL SCORE: 34    PATIENT EDUCATION: Education details: ST POC, repeat MBSS in 8 to 12 weeks Person educated: Patient and Spouse Education method: Chief Technology Officer Education comprehension: needs further education  HOME EXERCISE PROGRAM:     Implement aspiration strategies, compensatory swallow strateges   GOALS: Goals reviewed with patient? Yes  SHORT TERM GOALS: Target date: 10 sessions  With Mod I, pt will perform compensatory swallow strategies (chin tuck, multiple swallows, head turn) to eliminate s/s of aspiration when consuming least restrictive diet. Baseline: Goal status: INITIAL  2.  With Min A, pt will complete (supraglottic, swallow, Mednelson maneuver, effortful swallow) to improve oral motor weakness, tongue base retraction, hyolarnygeal excursion, airway protection and/or clearance of the bolus through the pharynx. Baseline:  Goal status: INITIAL  3. Pt will demonstrate overt s/s of aspiration when consuming ice chips.   Baseline:  Goal status: INITIAL   LONG TERM GOALS: Target date: 03/25/2024  With Mod I, pt will demonstrate the ability to  adequately self-monitor swallowing skills and perform appropriate compensatory techniques to reduce s/s of aspiration and to safely consume least restrictive diet.     Baseline:  Goal status: INITIAL  2.  Pt will participate in repeat instrumental swallow evaluation to assess least restrictive diet.  Baseline:  Goal status: INITIAL   ASSESSMENT:  CLINICAL IMPRESSION: Patient is a 73 y.o. male who was seen today for a clinical swallow evaluation d/t severe pharyngeal phase dysphagia following ACDF (03/2023).    While pt was eager to participate, he had maximal difficulty completing pharyngeal strengthening suspect d/t severity of dysphagia and weakened state from malnutrition. Continued high risk of aspiration with PO consumption. See the above treatment note for details.   OBJECTIVE IMPAIRMENTS include dysphagia. These impairments are limiting patient from safety when swallowing. Factors affecting potential  to achieve goals and functional outcome are severity of impairments and malnutrition. Patient will benefit from skilled SLP services to address above impairments and improve overall function.  REHAB POTENTIAL: Good  PLAN: SLP FREQUENCY: 1-2x/week  SLP DURATION: 12 weeks  PLANNED INTERVENTIONS: Aspiration precaution training, Pharyngeal strengthening exercises, Diet toleration management , Oral motor exercises, SLP instruction and feedback, Compensatory strategies, and Patient/family education  Hassell Roys, SLP Student Clinician   Happi B. Rubbie, M.S., CCC-SLP, Tree surgeon Certified Brain Injury Specialist [facility]  [facility] Rehabilitation Services Office [phone] Ascom [phone] Fax [phone]

## 2024-01-04 NOTE — Therapy (Addendum)
 OUTPATIENT PHYSICAL THERAPY TREATMENT     Patient Name: Shawn Lam MRN: [id number] DOB:Jan 05, 1951, 73 y.o., male Today's Date: 01/04/2024  END OF SESSION:  PT End of Session - 01/04/24 0855     Visit Number 4    Number of Visits 24    Date for Recertification  03/13/24    Authorization Type Medicare 2025    Authorization - Visit Number 4    Authorization - Number of Visits 24    Progress Note Due on Visit 10    PT Start Time 0845    PT Stop Time 0925    PT Time Calculation (min) 40 min    Activity Tolerance Patient tolerated treatment well;No increased pain           Past Medical History:  Diagnosis Date   Actinic keratosis 02/04/2021   right lateral neck   Anemia    Anxiety    Asymptomatic LV dysfunction    50-55% by echo 06/2016   BCC (basal cell carcinoma of skin) 08/05/2020   left neck infra auricular - nodular pattern - treated with [facility] 09/17/2020   Berger's disease    CAD (coronary artery disease) 06/2005   PCI of LAD and RCA   Depression    Dilated aortic root    4cm at sinus of Valsalva   Hematuria    Hyperlipidemia    Hypothyroidism    Insomnia    Left ureteral stone    Orthostatic hypotension    negative tilt table   OSA (obstructive sleep apnea)    moderate with AHI 17/hr, uses CPAP nightly(01/15/19 - has Inspire implant)   PVC's (premature ventricular contractions) 07/07/2016   Renal disorder    PT IS SEEING DR. BETSEY- NEPHROLOGIST   Rosacea    Urticaria    Past Surgical History:  Procedure Laterality Date   ANTERIOR CERVICAL DECOMP/DISCECTOMY FUSION N/A 03/13/2023   Procedure: CERVICAL FOUR-FIVE, CERVICAL FIVE-SIX ANTERIOR CERVICAL DISCECTOMY/DECOMPRESSION FUSION;  Surgeon: Onetha Kuba, MD;  Location: [facility] OR;  Service: Neurosurgery;  Laterality: N/A;  3C   CARDIAC CATHETERIZATION  09/06/2013   [facility]   CARDIAC CATHETERIZATION     COLONOSCOPY WITH PROPOFOL  N/A 01/21/2019   Procedure: COLONOSCOPY WITH PROPOFOL ;  Surgeon: Jinny Carmine, MD;   Location: [facility];  Service: Endoscopy;  Laterality: N/A;  sleep apnea   COLONOSCOPY WITH PROPOFOL  N/A 10/19/2021   Procedure: COLONOSCOPY WITH PROPOFOL ;  Surgeon: Jinny Carmine, MD;  Location: [facility] ENDOSCOPY;  Service: Endoscopy;  Laterality: N/A;   CORONARY ANGIOPLASTY  2004   STENT PLACEMENT   CYSTOSCOPY WITH RETROGRADE PYELOGRAM, URETEROSCOPY AND STENT PLACEMENT Left 03/19/2014   Procedure: CYSTOSCOPY WITH RETROGRADE PYELOGRAM, URETEROSCOPY AND STENT PLACEMENT;  Surgeon: Mickey Ricardo KATHEE Alvaro, MD;  Location: [facility] ORS;  Service: Urology;  Laterality: Left;   CYSTOSCOPY WITH RETROGRADE PYELOGRAM, URETEROSCOPY AND STENT PLACEMENT Bilateral 05/20/2015   Procedure:  CYSTOSCOPY WITH BILATERAL RETROGRADE PYELOGRAM,RIGHT  DIAGNOSTIC URETEROSCOPY ,LEFT URETEROSCOPY WITH HOLMIUM LASER  AND BILATERAL  STENT PLACEMENT ;  Surgeon: Ricardo Alvaro, MD;  Location: [facility];  Service: Urology;  Laterality: Bilateral;   DRUG INDUCED ENDOSCOPY N/A 10/20/2017   Procedure: DRUG INDUCED SLEEP ENDOSCOPY;  Surgeon: Carlie Clark, MD;  Location: [facility];  Service: ENT;  Laterality: N/A;   EP IMPLANTABLE DEVICE N/A 05/13/2016   Procedure: Loop Recorder Insertion;  Surgeon: Elspeth JAYSON Sage, MD;  Location: [facility];  Service: Cardiovascular;  Laterality: N/A;   ESOPHAGOGASTRODUODENOSCOPY N/A 10/19/2021   Procedure:  ESOPHAGOGASTRODUODENOSCOPY (EGD);  Surgeon: Jinny Carmine, MD;  Location: [facility] ENDOSCOPY;  Service: Endoscopy;  Laterality: N/A;   ESOPHAGOGASTRODUODENOSCOPY (EGD) WITH PROPOFOL  N/A 11/30/2018   Procedure: ESOPHAGOGASTRODUODENOSCOPY (EGD) WITH PROPOFOL ;  Surgeon: Therisa Bi, MD;  Location: [facility] ENDOSCOPY;  Service: Gastroenterology;  Laterality: N/A;   ESOPHAGOGASTRODUODENOSCOPY (EGD) WITH PROPOFOL  N/A 12/01/2018   Procedure: ESOPHAGOGASTRODUODENOSCOPY (EGD) WITH PROPOFOL ;  Surgeon: Jinny Carmine, MD;  Location: [facility] ENDOSCOPY;  Service: Endoscopy;  Laterality: N/A;   HOLMIUM LASER  APPLICATION Bilateral 05/20/2015   Procedure: HOLMIUM LASER APPLICATION;  Surgeon: Ricardo Likens, MD;  Location: [facility];  Service: Urology;  Laterality: Bilateral;   IMPLIMENTATION OF A HYPOGLOSSAL NERVE STIMULATOR  12/08/2017   OSA    LEFT HEART CATHETERIZATION WITH CORONARY ANGIOGRAM N/A 09/06/2013   Procedure: LEFT HEART CATHETERIZATION WITH CORONARY ANGIOGRAM;  Surgeon: Victory LELON Claudene DOUGLAS, MD;  Location: [facility] CATH LAB;  Service: Cardiovascular;  Laterality: N/A;   LEFT KNEE CAP  SURGERY ABOUT 40 YRS AGO  ? 1970     STONE EXTRACTION WITH BASKET Left 03/19/2014   Procedure: STONE EXTRACTION WITH BASKET;  Surgeon: Mickey Ricardo KATHEE Likens, MD;  Location: [facility];  Service: Urology;  Laterality: Left;   TRANSURETHRAL RESECTION OF PROSTATE N/A 08/25/2021   Procedure: TRANSURETHRAL RESECTION OF THE PROSTATE (TURP);  Surgeon: Likens Ricardo, MD;  Location: [facility];  Service: Urology;  Laterality: N/A;  1 HR   Patient Active Problem List   Diagnosis Date Noted   Fall 01/03/2024   Hematoma of right hip 01/03/2024   Chronic pulmonary aspiration 12/29/2023   Silent aspiration 12/29/2023   Moderate protein-calorie malnutrition 12/29/2023   Bilateral leg weakness 12/29/2023   Chronic respiratory failure with hypoxia, on home oxygen  therapy (HCC) 12/29/2023   Chronic constipation 12/29/2023   Somnolence 12/29/2023   Frequent falls 12/29/2023   Feeding difficulties, unspecified 12/20/2023   Shortness of breath 12/20/2023   Abnormal CT of the chest 10/10/2023   Elevated blood sugar 06/30/2023   Carotid stenosis 05/11/2023   Spinal stenosis in cervical region 03/13/2023   Osteoarthritis of spine with radiculopathy, cervical region 07/14/2022   Cervicogenic headache 11/30/2021   Chronic neck pain 11/30/2021   Weight loss, non-intentional    Dysphagia    Polyp of colon    Prostatic hyperplasia 08/25/2021   Chronic insomnia 03/19/2020   Peripheral neuropathy 03/19/2020   Iron deficiency anemia 01/06/2020    Allergic dermatitis 07/08/2019   Osteoarthritis of left knee 02/26/2019   History of colonic polyps    History of duodenal ulcer    CKD (chronic kidney disease), stage IIIa    Bilateral chronic knee pain 11/09/2018   Epistaxis 10/02/2018   Subjective tinnitus, bilateral 10/02/2018   Chronic pain of right thumb 02/22/2018   Acute bilateral thoracic back pain 01/23/2018   Osteoporosis without current pathological fracture 02/10/2017   Hypothyroidism 11/02/2016   Thoracic compression fracture (HCC) 09/06/2016   PVC's (premature ventricular contractions) 07/07/2016   Hypoalbuminemia 06/28/2016   IgA nephropathy 06/07/2016   Acute buttock pain 05/23/2016   MDD (major depressive disorder), recurrent episode, moderate (HCC) 11/20/2015   OSA (obstructive sleep apnea) 02/05/2014   Orthostatic hypotension 02/07/2013   Scotoma involving central area 01/29/2013   Optic neuropathy 01/28/2013   HEMATURIA UNSPECIFIED 01/21/2010   HLD (hyperlipidemia) 07/05/2006   Generalized anxiety disorder 07/05/2006   CAD (coronary artery disease) 07/05/2006   GERD 07/05/2006    PCP: Dr. Greig Ring    REFERRING PROVIDER: Dr. Arley Helling   REFERRING DIAG:  M54.6 (ICD-10-CM) - Acute bilateral thoracic back pain  THERAPY DIAG:  Cervicalgia  Pain in thoracic spine  Rationale for Evaluation and Treatment: Rehabilitation  ONSET DATE: May 2025    SUBJECTIVE:                                                                                                                                                                                                         SUBJECTIVE STATEMENT:           No major updates since last session. Pt is please to be here for his first visit since transfer of services.    PERTINENT HISTORY:  73yoM referred to physical therapy for ongoing neck pain following cervical fusion in May 2020 and newly developed thoracic pain from T12 compression fracture that occurred sometime in Spring  of 2025 (associated radicular pain pattern). Pt has pain localized to upper traps and suboccipitals. Pt attempted DC of prednisone  and gabapentin  two weeks prior to evaluation here, severe decline in function due to pain, barely able to walk for two weeks. He describes feeling increased fatigue and weakness to the point where he struggled to walk. Pt also reported repeated falls in the setting of orthostatic hypotension with him experiencing syncope when he stands up from a seated position. He describes feeling a burning sensation that is also localized to his scapulae and that it occurs when he is doing UE activities like washing his car that is likely the radicular pain from thoracic vertebrae compression fracture. PAIN:  Are you having pain? 8/10 mid thoracic between scapulae  PRECAUTIONS: None  WEIGHT BEARING RESTRICTIONS: No  FALLS:  Has patient fallen in last 6 months? Yes. Number of falls 6, pt reports that falls are happening in the context of syncope when standing up from a seated position (Orthostatic Hypotension) , where he blacks out and falls. He is following up with his PCP, Dr. Karlene who is working with him on stabilizing his blood pressure and she has recently taken him off Gabapentin  to see if this helps. Pt reports that he does note an improvement in his symptoms with less light headedness since going off medication.   LIVING ENVIRONMENT: Lives with: lives with their spouse Lives in: House/apartment Stairs: No Has following equipment at home: Single point cane and Environmental consultant - 4 wheeled  OCCUPATION: Retired     PLOF: Independent  PATIENT GOALS: Relief of his neck pain while performing upper extremity activities.    NEXT MD VISIT: October 2025     OBJECTIVE:  Note: Objective measures were  completed at evaluation unless otherwise noted.  DIAGNOSTIC FINDINGS:  CLINICAL DATA:  Back pain with occasional radicular symptoms.   EXAM: MRI THORACIC SPINE WITHOUT CONTRAST    TECHNIQUE: Multiplanar, multisequence MR imaging of the thoracic spine was performed. No intravenous contrast was administered.   COMPARISON:  MRI of the thoracic spine dated April 17, 2017.   FINDINGS: Alignment:  Physiologic.   Vertebrae: Chronic compression deformities of T7, T8, T9 and T12. There is retropulsion of the posterosuperior corner of T12 again demonstrated, resulting in mild-to-moderate central spinal canal stenosis, but no spinal cord impingement.   Cord:  Normal in morphology and signal intensity.   Paraspinal and other soft tissues: There are patchy, streaky opacities present in the posterior periphery of the lungs bilaterally the immediate paraspinal soft tissues are unremarkable.   Disc levels:   There is minimal posterior disc bulging present at T7-8 common T9-10 and T10-11, causing minimal central spinal canal stenosis at each level. There is mild-to-moderate central spinal canal stenosis at T12 secondary to retropulsion of the superior posterior corner of the vertebral body. Otherwise, on the spinal canal and neural foramina are widely patent throughout the thoracic region.   IMPRESSION: 1. Chronic compression deformities of T7, T8, T9 and T12, with mild-to-moderate central spinal canal stenosis at T12. There is no adverse interval change. 2. There are patchy, streaky peripheral opacities present posteriorly within the upper lung zones bilaterally, which could be better assessed with a CT of the thorax.     Electronically Signed   By: Evalene Coho M.D.   On: 10/02/2023 17:14   TREATMENT DATE 01/04/24 :   UBE  with seat at 4 2 min forward and 2 min backward, level 4  98/13mmHg 66bpm seated 98/42mmHg  68BPM standing  Cable  low row 1x15 @ 17.5 Repeat extension over chair back with blue physioball wand flexion x15  Standing locked elbow shoulder extension 90 to pockets  Cable  low row 1x15 @ 17.5 Repeat extension over chai rback with  blue physioball wand flexion x15  Standing locked elbow shoulder extneison 90 to pockets   Edge of chair russian twist with sternal med ball 10lb x20 alteranting STS from chair with 10lb med ball x3. Then 3 more without ball (from airex pad)   Casually reclined on chair alternate marching 1x20  Edge of chair russian twist with sternal med ball 10lb x20 alteranting STS from chair with 10lb med ball x3. Then 3 more without ball (from airex pad)   Casually reclined on chair alternate marching 1x20    PATIENT EDUCATION:  Education details: Form and technique for correct performance of exercise and explanation about cervical deficits    Person educated: Patient Education method: Medical illustrator Education comprehension: verbalized understanding, returned demonstration, and verbal cues required  HOME EXERCISE PROGRAM: Access Code: [email or web address] URL: [email or web address] Date: 12/27/2023 Prepared by: Toribio Servant  Exercises - Supine Cervical Rotation AROM on Flat Ball  - 1 x daily - 7 x weekly - 2 sets - 10 reps - Seated Cervical Sidebending Stretch  - 1 x daily - 7 x weekly - 3 reps - 30-60 sec  hold - Seated Cervical Sidebending Stretch (Mirrored)  - 1 x daily - 7 x weekly - 3 reps - 30-60 sec  hold - Seated Cervical Flexion Stretch with Finger Support Behind Neck  - 1 x daily - 7 x weekly - 3 reps - 60 sec  hold - Supine Suboccipital Release with Tennis Balls  -  1 x daily - 7 x weekly - 3 reps - 3-5 min hold - Standing Bent Over Single Arm Scapular Row with Table Support  - 3-4 x weekly - 3 sets - 10 reps - Standing Bent Over Single Arm Scapular Row with Table Support (Mirrored)  - 3-4 x weekly - 3 sets - 10 reps - Seated Shoulder Horizontal Abduction with Dumbbells - Thumbs Up  - 3-4 x weekly - 3 sets - 10 reps - Standing Shoulder Single Arm PNF D2 Flexion with Resistance  - 3-4 x weekly - 3 sets - 10 reps - Standing Shoulder Single Arm PNF D2 Flexion with  Resistance (Mirrored)  - 3-4 x weekly - 3 sets - 10 reps  ASSESSMENT:  CLINICAL IMPRESSION: Pt does well with his continued activities. Pain stable in session. BP low today, but stable  He will benefit from skilled PT to address these aforementioned deficits to decrease his neck and thoracic spine pain to return to completing UE activities like washing his car and completing yard work without being limited by pain.     OBJECTIVE IMPAIRMENTS: decreased balance, decreased ROM, decreased strength, hypomobility, impaired flexibility, impaired UE functional use, and pain.   ACTIVITY LIMITATIONS: carrying, lifting, standing, bathing, dressing, reach over head, and hygiene/grooming PARTICIPATION LIMITATIONS: cleaning, laundry, shopping, community activity, and yard work PERSONAL FACTORS: Age and 3+ comorbidities: CAD, Depression, are also affecting patient's functional outcome.  REHAB POTENTIAL: Good CLINICAL DECISION MAKING: Stable/uncomplicated EVALUATION COMPLEXITY: Low   GOALS: Goals reviewed with patient? No  SHORT TERM GOALS: Target date: 01/03/2024  Patient will demonstrate undestanding of home exercise plan by performing exercises correctly with evidence of good carry over with min to no verbal or tactile cues .   Baseline: NT 12/25/23: Performing exercises independently  Goal status: ACHIEVED   2.  Patient will demonstrate understanding of how to modify exercises to decrease thoracic and cervical pain exacerbation for improved function and to decrease incidence of pain flare ups.  Baseline: Does not currently have strategy to manage this  Goal status: ONGOING     LONG TERM GOALS: Target date: 03/13/2024  Patient will show a statistically significant improvement in her neck function as evidence by >=7.5 pt decrease in her neck disability index score to better be able to move neck to improve visual field while driving to avoid accidents. Vernel et al, 2009) Baseline: 33/50 (66%) Goal  status: ONGOING    2.  Patient will increase cervical mobility by  >=5 degrees AROM for all planes of motion (flexion, extension, side bending, and rotation) for improved cervical function and evidence of pain reduction. Baseline: Cervical AROM Flex 30, Ext 30, Rot R/L 40/40, SB R/L 35/35    Goal status: ONGOING    3.  Patient will improve shoulder and periscapular strength by 1/3 grade MMT (4- to 4) for improved cervical stability and UE function to carry out upper extremity activities like washing his car and gardening without being limited by pain and discomfort.  Baseline: Shoulder Flex R/L 4/4, Shoulder Abd R/L 4/4    Goal status: ONGOING    4.  Patient will reduce neck and thoracic pain to <=4/10 NRPS while performing UE activities like washing his car and doing yard work as evidence of improved cervical and thoracic function.  Baseline: 10/10 cervical paraspinal and rhomboid pain with UE activity.  Goal status: ONGOING    PLAN:  PT FREQUENCY: 1-2x/week PT DURATION: 12 weeks PLANNED INTERVENTIONS: [id number]- PT Re-evaluation, 97750- Physical Performance  Testing, 97110-Therapeutic exercises, 97530- Therapeutic activity, [id number]- Neuromuscular re-education, [phone]- Self Care, 02859- Manual therapy, [phone]- Gait training, [phone]- Canalith repositioning, [id number]- Aquatic Therapy, [phone]- Electrical stimulation (unattended), [phone]- Electrical stimulation (manual), [id number]- Traction (mechanical), [phone] (1-2 muscles), 20561 (3+ muscles)- Dry Needling, Patient/Family education, Balance training, Joint mobilization, Joint manipulation, Spinal manipulation, Spinal mobilization, Vestibular training, DME instructions, Cryotherapy, and Moist heat  PLAN FOR NEXT SESSION:  continue with POC, update HEP as needed.   9:03 AM, 01/04/24 Peggye JAYSON Linear, PT, DPT Physical Therapist - [facility]  Outpatient Physical Therapy- Main [facility] [phone]    *addendum on 9/30 to update  subjective report omitted at time of first documentation.   2:24 PM, 01/09/24 Peggye JAYSON Linear, PT, DPT Physical Therapist - [place] [facility]  Outpatient Physical Therapy- Main Campus [phone]

## 2024-01-05 ENCOUNTER — Ambulatory Visit: Admitting: Sleep Medicine

## 2024-01-05 ENCOUNTER — Encounter: Payer: Self-pay | Admitting: Family Medicine

## 2024-01-05 ENCOUNTER — Encounter: Payer: Self-pay | Admitting: Sleep Medicine

## 2024-01-05 VITALS — BP 110/70 | HR 63 | Temp 97.5°F | Ht 72.0 in | Wt 116.2 lb

## 2024-01-05 DIAGNOSIS — J449 Chronic obstructive pulmonary disease, unspecified: Secondary | ICD-10-CM

## 2024-01-05 DIAGNOSIS — I10 Essential (primary) hypertension: Secondary | ICD-10-CM

## 2024-01-05 DIAGNOSIS — G4733 Obstructive sleep apnea (adult) (pediatric): Secondary | ICD-10-CM

## 2024-01-05 NOTE — Progress Notes (Signed)
 Name:Shawn Lam MRN: [id number] DOB: 01/02/1951   CHIEF COMPLAINT:  ESTABLISH CARE FOR INSPIRE   HISTORY OF PRESENT ILLNESS: Shawn Lam is a 73 y.o. w/ a h/o OSA, HTN, hypothyroidism, anxiety, depression, CAD and COPD on 3 LPM who presents to establish care for OSA. Patient was initially diagnosed with moderate OSA in 2017 and was subsequently started on CPAP therapy which he used for a brief period. States that he discontinued CPAP therapy due to discomfort. Inspire device was implanted by Dr. Carlie in 2019. Reports initially feeling refreshed upon awakening with Inspire but started feeling unrefreshed around 1-2 years ago. Reports a 40 lb weight loss over the last several months due to dysphagia. States that he underwent neck surgery 5 months ago and has been having dysphagia since that time.   Reports using Inspire therapy nightly and has been snoring despite nightly usage.  Reports multiple nocturnal awakenings due to unclear reasons. Admits to night sweats, dry mouth and morning headaches. Denies RLS symptoms, dream enactment, cataplexy, hypnagogic or hypnapompic hallucinations. Denies a family history of sleep apnea. Denies drowsy driving. Drinks 2 cups of coffee and 1 soda daily, occasional alcohol use, denies tobacco or illicit drug use.   Bedtime 10 pm Sleep onset 1 hour with Ambien  Rise time 4-4:30 am   EPWORTH SLEEP SCORE 8    01/05/2024   10:40 AM  Results of the Epworth flowsheet  Sitting and reading 3  Watching TV 2  Sitting, inactive in a public place (e.g. a theatre or a meeting) 0  As a passenger in a car for an hour without a break 0  Lying down to rest in the afternoon when circumstances permit 3  Sitting and talking to someone 0  Sitting quietly after a lunch without alcohol 0  In a car, while stopped for a few minutes in traffic 0  Total score 8    PAST MEDICAL HISTORY :   has a past medical history of Actinic keratosis (02/04/2021), Anemia,  Anxiety, Asymptomatic LV dysfunction, BCC (basal cell carcinoma of skin) (08/05/2020), Berger's disease, CAD (coronary artery disease) (06/2005), Depression, Dilated aortic root, Hematuria, Hyperlipidemia, Hypothyroidism, Insomnia, Left ureteral stone, Orthostatic hypotension, OSA (obstructive sleep apnea), PVC's (premature ventricular contractions) (07/07/2016), Renal disorder, Rosacea, and Urticaria.  has a past surgical history that includes LEFT KNEE CAP  SURGERY ABOUT 40 YRS AGO  ? 1970; Cardiac catheterization (09/06/2013); Cardiac catheterization; Coronary angioplasty (2004); left heart catheterization with coronary angiogram (N/A, 09/06/2013); Cystoscopy with retrograde pyelogram, ureteroscopy and stent placement (Left, 03/19/2014); Stone extraction with basket (Left, 03/19/2014); Cystoscopy with retrograde pyelogram, ureteroscopy and stent placement (Bilateral, 05/20/2015); Holmium laser application (Bilateral, 05/20/2015); Cardiac catheterization (N/A, 05/13/2016); Drug induced endoscopy (N/A, 10/20/2017); IMPLIMENTATION OF A HYPOGLOSSAL NERVE STIMULATOR (12/08/2017); Esophagogastroduodenoscopy (egd) with propofol  (N/A, 11/30/2018); Esophagogastroduodenoscopy (egd) with propofol  (N/A, 12/01/2018); Colonoscopy with propofol  (N/A, 01/21/2019); Transurethral resection of prostate (N/A, 08/25/2021); Colonoscopy with propofol  (N/A, 10/19/2021); Esophagogastroduodenoscopy (N/A, 10/19/2021); and Anterior cervical decomp/discectomy fusion (N/A, 03/13/2023). Prior to Admission medications   Medication Sig Start Date End Date Taking? Authorizing Provider  aspirin  EC 81 MG tablet Take 81 mg by mouth daily. 10/02/18   [provider]  atorvastatin  (LIPITOR ) 80 MG tablet TAKE 1 TABLET BY MOUTH DAILY 08/21/23   Bedsole, Amy E, MD  baclofen (LIORESAL) 10 MG tablet Take 10 mg by mouth 2 (two) times daily. 09/19/23   [provider]  buPROPion  (WELLBUTRIN  XL) 150 MG 24 hr tablet TAKE 3 TABLETS  BY MOUTH DAILY 11/13/23    Bedsole, Amy E, MD  busPIRone  (BUSPAR ) 10 MG tablet TAKE 2 TABLETS BY MOUTH 3 TIMES A DAY 11/06/23   Bedsole, Amy E, MD  Cholecalciferol  (VITAMIN D3) 50 MCG (2000 UT) TABS Take 2,000 Units by mouth daily.    [provider]  clopidogrel  (PLAVIX ) 75 MG tablet TAKE 1 TABLET BY MOUTH DAILY 06/05/23   Gerard Frederick, NP  cyanocobalamin  (VITAMIN B12) 1000 MCG tablet Take 1,000 mcg by mouth daily. 10/02/18   [provider]  denosumab  (PROLIA ) 60 MG/ML SOSY injection Inject 60 mg into the skin every 6 (six) months. Do not send RX, order this from the office    [provider]  DULoxetine  (CYMBALTA ) 30 MG capsule TAKE THREE CAPSULES BY MOUTH DAILY 10/11/23   Bedsole, Amy E, MD  Erenumab-aooe (AIMOVIG) 140 MG/ML SOAJ 140 mg every 28 (twenty-eight) days.    [provider]  finasteride  (PROSCAR ) 5 MG tablet Take 5 mg by mouth daily. 01/22/21   [provider]  levothyroxine  (SYNTHROID ) 88 MCG tablet TAKE 1 TABLET BY MOUTH DAILY BEFORE BREAKFAST 08/28/23   Bedsole, Amy E, MD  losartan  (COZAAR ) 50 MG tablet Take 1 tablet (50 mg total) by mouth daily. 12/25/23   Gollan, Timothy J, MD  Magnesium  250 MG CAPS Take 250 mg by mouth daily.    [provider]  metoCLOPramide (REGLAN) 10 MG tablet Take 10 mg by mouth every 8 (eight) hours as needed. 12/13/23   [provider]  metoprolol  succinate (TOPROL  XL) 25 MG 24 hr tablet Take 1 tablet (25 mg total) by mouth daily. 08/09/23   Gerard Frederick, NP  nitroGLYCERIN  (NITROSTAT ) 0.4 MG SL tablet Place 1 tablet (0.4 mg total) under the tongue every 5 (five) minutes as needed for chest pain. 07/04/23   Bedsole, Amy E, MD  triamcinolone  cream (KENALOG ) 0.1 % APPLY 2 TOPICALLY DAILY , DO NOT APPLY ON DACE , GROIN AND UNDERARMS. 12/29/23   Bedsole, Amy E, MD  zolpidem  (AMBIEN ) 5 MG tablet Take 1 tablet (5 mg total) by mouth at bedtime as needed. 12/18/23   Bedsole, Amy E, MD  zonisamide  (ZONEGRAN ) 25 MG capsule Take 1 capsule  (25 mg total) by mouth daily. 12/15/23   Avelina Greig BRAVO, MD   Allergies  Allergen Reactions   Codeine Nausea Only and Nausea And Vomiting   Tape Other (See Comments)    Use only paper tape. Severe blistering and bruising with other tapes. No cloth tape.    FAMILY HISTORY:  family history includes Cancer in his father; Coronary artery disease in his mother; Diabetes in his brother; Heart attack in his mother; Heart disease in his mother; Hypertension in his mother; Lung cancer in his father. SOCIAL HISTORY:  reports that he has never smoked. He has never used smokeless tobacco. He reports that he does not currently use alcohol after a past usage of about 1.0 standard drink of alcohol per week. He reports that he does not use drugs.   Review of Systems:  Gen:  Denies  fever, sweats, chills weight loss  HEENT: Denies blurred vision, double vision, ear pain, eye pain, hearing loss, nose bleeds, sore throat Cardiac:  No dizziness, chest pain or heaviness, chest tightness,edema, No JVD Resp:   No cough, -sputum production, -shortness of breath,-wheezing, -hemoptysis,  Gi: Denies swallowing difficulty, stomach pain, nausea or vomiting, diarrhea, constipation, bowel incontinence Gu:  Denies bladder incontinence, burning urine Ext:   Denies Joint pain,  stiffness or swelling Skin: Denies  skin rash, easy bruising or bleeding or hives Endoc:  Denies polyuria, polydipsia , polyphagia or weight change Psych:   Denies depression, insomnia or hallucinations  Other:  All other systems negative  VITAL SIGNS: BP 110/70   Pulse 63   Temp (!) 97.5 F (36.4 C)   Ht 6' (1.829 m)   Wt 116 lb 3.2 oz (52.7 kg)   SpO2 97%   BMI 15.76 kg/m    Physical Examination:   General Appearance: No distress  EYES PERRLA, EOM intact.   NECK Supple, No JVD Pulmonary: normal breath sounds, No wheezing.  CardiovascularNormal S1,S2.  No m/r/g.   Abdomen: Benign, Soft, non-tender. Skin:   warm, no rashes, no  ecchymosis  Extremities: normal, no cyanosis, clubbing. Neuro:without focal findings,  speech normal  PSYCHIATRIC: Mood, affect within normal limits.   ASSESSMENT AND PLAN  OSA Inspire device interrogated in office. Amplitude range was changed today. Will complete a home sleep study with the first night off of therapy and second night on therapy. Discussed the consequences of untreated sleep apnea. Advised not to drive drowsy for safety of patient and others. Will complete further evaluation with a home sleep study and follow up to review results.    HTN Stable, on current management. Following with PCP.   COPD Stable, on current management.    MEDICATION ADJUSTMENTS/LABS AND TESTS ORDERED: Recommend Sleep Study   Patient  satisfied with Plan of action and management. All questions answered  Follow up to review HST results and treatment plan.   I spent a total of 63 minutes reviewing chart data, face-to-face evaluation with the patient, counseling and coordination of care as detailed above.    Maleke Feria, M.D.  Sleep Medicine [facility]

## 2024-01-05 NOTE — Patient Instructions (Signed)
 Shawn Lam

## 2024-01-08 ENCOUNTER — Other Ambulatory Visit: Payer: Self-pay | Admitting: Family Medicine

## 2024-01-09 ENCOUNTER — Ambulatory Visit

## 2024-01-09 ENCOUNTER — Ambulatory Visit: Admitting: Speech Pathology

## 2024-01-09 ENCOUNTER — Ambulatory Visit: Admitting: Physical Therapy

## 2024-01-09 DIAGNOSIS — R809 Proteinuria, unspecified: Secondary | ICD-10-CM | POA: Diagnosis not present

## 2024-01-09 DIAGNOSIS — D631 Anemia in chronic kidney disease: Secondary | ICD-10-CM | POA: Diagnosis not present

## 2024-01-09 DIAGNOSIS — M542 Cervicalgia: Secondary | ICD-10-CM | POA: Diagnosis not present

## 2024-01-09 DIAGNOSIS — N182 Chronic kidney disease, stage 2 (mild): Secondary | ICD-10-CM | POA: Diagnosis not present

## 2024-01-09 DIAGNOSIS — R1312 Dysphagia, oropharyngeal phase: Secondary | ICD-10-CM

## 2024-01-09 DIAGNOSIS — N2581 Secondary hyperparathyroidism of renal origin: Secondary | ICD-10-CM | POA: Diagnosis not present

## 2024-01-09 DIAGNOSIS — M546 Pain in thoracic spine: Secondary | ICD-10-CM | POA: Diagnosis not present

## 2024-01-09 NOTE — Therapy (Signed)
 OUTPATIENT SPEECH LANGUAGE PATHOLOGY  SWALLOW TREATMENT   Patient Name: Shawn Lam MRN: [id number] DOB:1950-07-18, 73 y.o., male Today's Date: 01/09/2024  PCP: Greig Ring, MD REFERRING PROVIDER: Greig Ring, MD   End of Session - 01/09/24 1355     Visit Number 3    Number of Visits 25    Date for Recertification  03/25/24    Authorization Type Medicare Part A    Progress Note Due on Visit 10    SLP Start Time 1400    SLP Stop Time  1445    SLP Time Calculation (min) 45 min    Activity Tolerance Patient tolerated treatment well          Past Medical History:  Diagnosis Date   Actinic keratosis 02/04/2021   right lateral neck   Anemia    Anxiety    Asymptomatic LV dysfunction    50-55% by echo 06/2016   BCC (basal cell carcinoma of skin) 08/05/2020   left neck infra auricular - nodular pattern - treated with [facility] 09/17/2020   Berger's disease    CAD (coronary artery disease) 06/2005   PCI of LAD and RCA   Depression    Dilated aortic root    4cm at sinus of Valsalva   Hematuria    Hyperlipidemia    Hypothyroidism    Insomnia    Left ureteral stone    Orthostatic hypotension    negative tilt table   OSA (obstructive sleep apnea)    moderate with AHI 17/hr, uses CPAP nightly(01/15/19 - has Inspire implant)   PVC's (premature ventricular contractions) 07/07/2016   Renal disorder    PT IS SEEING DR. BETSEY- NEPHROLOGIST   Rosacea    Urticaria    Past Surgical History:  Procedure Laterality Date   ANTERIOR CERVICAL DECOMP/DISCECTOMY FUSION N/A 03/13/2023   Procedure: CERVICAL FOUR-FIVE, CERVICAL FIVE-SIX ANTERIOR CERVICAL DISCECTOMY/DECOMPRESSION FUSION;  Surgeon: Onetha Kuba, MD;  Location: [facility] OR;  Service: Neurosurgery;  Laterality: N/A;  3C   CARDIAC CATHETERIZATION  09/06/2013   [facility]   CARDIAC CATHETERIZATION     COLONOSCOPY WITH PROPOFOL  N/A 01/21/2019   Procedure: COLONOSCOPY WITH PROPOFOL ;  Surgeon: Jinny Carmine, MD;  Location: [facility];   Service: Endoscopy;  Laterality: N/A;  sleep apnea   COLONOSCOPY WITH PROPOFOL  N/A 10/19/2021   Procedure: COLONOSCOPY WITH PROPOFOL ;  Surgeon: Jinny Carmine, MD;  Location: [facility] ENDOSCOPY;  Service: Endoscopy;  Laterality: N/A;   CORONARY ANGIOPLASTY  2004   STENT PLACEMENT   CYSTOSCOPY WITH RETROGRADE PYELOGRAM, URETEROSCOPY AND STENT PLACEMENT Left 03/19/2014   Procedure: CYSTOSCOPY WITH RETROGRADE PYELOGRAM, URETEROSCOPY AND STENT PLACEMENT;  Surgeon: Mickey Ricardo KATHEE Alvaro, MD;  Location: [facility] ORS;  Service: Urology;  Laterality: Left;   CYSTOSCOPY WITH RETROGRADE PYELOGRAM, URETEROSCOPY AND STENT PLACEMENT Bilateral 05/20/2015   Procedure:  CYSTOSCOPY WITH BILATERAL RETROGRADE PYELOGRAM,RIGHT  DIAGNOSTIC URETEROSCOPY ,LEFT URETEROSCOPY WITH HOLMIUM LASER  AND BILATERAL  STENT PLACEMENT ;  Surgeon: Ricardo Alvaro, MD;  Location: [facility];  Service: Urology;  Laterality: Bilateral;   DRUG INDUCED ENDOSCOPY N/A 10/20/2017   Procedure: DRUG INDUCED SLEEP ENDOSCOPY;  Surgeon: Carlie Clark, MD;  Location: [facility];  Service: ENT;  Laterality: N/A;   EP IMPLANTABLE DEVICE N/A 05/13/2016   Procedure: Loop Recorder Insertion;  Surgeon: Elspeth JAYSON Sage, MD;  Location: [facility] INV[facility];  Service: Cardiovascular;  Laterality: N/A;   ESOPHAGOGASTRODUODENOSCOPY N/A 10/19/2021   Procedure: ESOPHAGOGASTRODUODENOSCOPY (EGD);  Surgeon: Jinny Carmine, MD;  Location: [facility] ENDOSCOPY;  Service: Endoscopy;  Laterality: N/A;   ESOPHAGOGASTRODUODENOSCOPY (EGD) WITH PROPOFOL  N/A 11/30/2018   Procedure: ESOPHAGOGASTRODUODENOSCOPY (EGD) WITH PROPOFOL ;  Surgeon: Therisa Bi, MD;  Location: [facility] ENDOSCOPY;  Service: Gastroenterology;  Laterality: N/A;   ESOPHAGOGASTRODUODENOSCOPY (EGD) WITH PROPOFOL  N/A 12/01/2018   Procedure: ESOPHAGOGASTRODUODENOSCOPY (EGD) WITH PROPOFOL ;  Surgeon: Jinny Carmine, MD;  Location: [facility] ENDOSCOPY;  Service: Endoscopy;  Laterality: N/A;   HOLMIUM LASER APPLICATION Bilateral 05/20/2015    Procedure: HOLMIUM LASER APPLICATION;  Surgeon: Ricardo Likens, MD;  Location: [facility];  Service: Urology;  Laterality: Bilateral;   IMPLIMENTATION OF A HYPOGLOSSAL NERVE STIMULATOR  12/08/2017   OSA    LEFT HEART CATHETERIZATION WITH CORONARY ANGIOGRAM N/A 09/06/2013   Procedure: LEFT HEART CATHETERIZATION WITH CORONARY ANGIOGRAM;  Surgeon: Victory LELON Claudene DOUGLAS, MD;  Location: [facility];  Service: Cardiovascular;  Laterality: N/A;   LEFT KNEE CAP  SURGERY ABOUT 40 YRS AGO  ? 1970     STONE EXTRACTION WITH BASKET Left 03/19/2014   Procedure: STONE EXTRACTION WITH BASKET;  Surgeon: Mickey Ricardo KATHEE Likens, MD;  Location: [facility];  Service: Urology;  Laterality: Left;   TRANSURETHRAL RESECTION OF PROSTATE N/A 08/25/2021   Procedure: TRANSURETHRAL RESECTION OF THE PROSTATE (TURP);  Surgeon: Likens Ricardo, MD;  Location: [facility];  Service: Urology;  Laterality: N/A;  1 HR   Patient Active Problem List   Diagnosis Date Noted   Fall 01/03/2024   Hematoma of right hip 01/03/2024   Chronic pulmonary aspiration 12/29/2023   Silent aspiration 12/29/2023   Moderate protein-calorie malnutrition 12/29/2023   Bilateral leg weakness 12/29/2023   Chronic respiratory failure with hypoxia, on home oxygen  therapy (HCC) 12/29/2023   Chronic constipation 12/29/2023   Somnolence 12/29/2023   Frequent falls 12/29/2023   Feeding difficulties, unspecified 12/20/2023   Shortness of breath 12/20/2023   Abnormal CT of the chest 10/10/2023   Elevated blood sugar 06/30/2023   Carotid stenosis 05/11/2023   Spinal stenosis in cervical region 03/13/2023   Osteoarthritis of spine with radiculopathy, cervical region 07/14/2022   Cervicogenic headache 11/30/2021   Chronic neck pain 11/30/2021   Weight loss, non-intentional    Dysphagia    Polyp of colon    Prostatic hyperplasia 08/25/2021   Chronic insomnia 03/19/2020   Peripheral neuropathy 03/19/2020   Iron deficiency anemia 01/06/2020   Allergic dermatitis 07/08/2019    Osteoarthritis of left knee 02/26/2019   History of colonic polyps    History of duodenal ulcer    CKD (chronic kidney disease), stage IIIa    Bilateral chronic knee pain 11/09/2018   Epistaxis 10/02/2018   Subjective tinnitus, bilateral 10/02/2018   Chronic pain of right thumb 02/22/2018   Acute bilateral thoracic back pain 01/23/2018   Osteoporosis without current pathological fracture 02/10/2017   Hypothyroidism 11/02/2016   Thoracic compression fracture (HCC) 09/06/2016   PVC's (premature ventricular contractions) 07/07/2016   Hypoalbuminemia 06/28/2016   IgA nephropathy 06/07/2016   Acute buttock pain 05/23/2016   MDD (major depressive disorder), recurrent episode, moderate (HCC) 11/20/2015   OSA (obstructive sleep apnea) 02/05/2014   Orthostatic hypotension 02/07/2013   Scotoma involving central area 01/29/2013   Optic neuropathy 01/28/2013   HEMATURIA UNSPECIFIED 01/21/2010   HLD (hyperlipidemia) 07/05/2006   Generalized anxiety disorder 07/05/2006   CAD (coronary artery disease) 07/05/2006   GERD 07/05/2006    ONSET DATE: 12/*2024 following ACDF; Date of referral 12/28/2023  REFERRING DIAG:  T17.908D (ICD-10-CM) - Chronic pulmonary aspiration, subsequent encounter  T17.900D (ICD-10-CM) - Silent aspiration, subsequent  encounter  E44.0 (ICD-10-CM) - Moderate protein-calorie malnutrition  R13.10 (ICD-10-CM) - Dysphagia, unspecified type    THERAPY DIAG:  Oropharyngeal dysphagia  Rationale for Evaluation and Treatment Rehabilitation  SUBJECTIVE:   PERTINENT HISTORY and DIAGNOSTIC FINDINGS:  Pt is a 73 yo male w/ ACDF surgery 03/2023.  PMH: GERD(Not on a PPI per chart), Actinic keratosis (02/04/2021), Anemia, Anxiety, Asymptomatic LV dysfunction, BCC (basal cell carcinoma of skin) (08/05/2020), Berger's disease, CAD (coronary artery disease) (06/2005), resting tremor, Depression, Dilated aortic root (HCC), Hematuria, Hyperlipidemia, Hypothyroidism, Insomnia, Left  ureteral stone, Orthostatic hypotension, OSA (obstructive sleep apnea), PVC's (premature ventricular contractions) (07/07/2016), Renal disorder, Rosacea, and Urticaria.  Pt is followed by Cardiology for Coronary Artery Stenosis currently; also followed for Migraines.     EGD in 2023 revealed Gastritis.      CT of Chest 10/2023: 1. Interim development of patchy consolidation and ground-glass   disease most evident at the right middle lobe and right lower lobes   with additional areas of mild ground-glass disease and scattered   clustered nodularity, findings are suspicious for multifocal   infection/pneumonia. Imaging follow-up to resolution is recommended.   2. Emphysema.   3. Aortic atherosclerosis.            Dysphagia - reported by pt as Chronic, ever since neck surgery in December 2024. Pt was seen in the emergency room s/p Fall, notes detailing that he slipped on soap in the shower, fell backwards on right side hit head and neck against shower seat, possible brief loss of consciousness, per chart.  ACDF 03/2023 per chart.  Since the neck surgery in 03/2023, he has noted food, liquids and pill getting stuck on the right side of my throat more.  I used to cough a lot more but not as much now.  I cough up what gets stuck..  He has lost significant weight per chart notes due to decreased eating.  Pt endorsed using his home O2 when he feels SOB.   MBSS 12/26/2023 Patient appears to present w/ Chronic, moderate-severe Pharyngeal phase dysphagia per this assessment today. It appeared there was some impact from changes in the Cervical Esophagus s/p ACDF surgery(03/2023 per chart notes) including structural/tissue changes in/aournd the CP segment and cervical esophagus. Pt with intermittent silent aspiration of thin liquids.   Recommend continue current diet of easy to eat foods at home; thin liquids via cup- at this time. Oral care Before any oral intake. Swallow Strategies Recommended: No straws,  moistened foods, SMALL bites/sips, Head Turn RIGHT w/ slight chin tuck, multiple swallows to aid clearing, AND strong Cough/throat clear b/t bites/sips and at end of meal.  D/t the Chronicity of pt's Pharyngeal phase Dysphagia w/ weight loss and pulmonary impact per recent Chest CT, pt and Family may need to discuss alternative means of nutrition/hydration as support w/ his Medical Team/PCP, while seeking swallow therapy.  PAIN:  Are you having pain? No  FALLS: Has patient fallen in last 6 months?  See PT evaluation for details  LIVING ENVIRONMENT: Lives with: lives with their spouse Lives in: House/apartment  PLOF:  Level of assistance: Independent with ADLs, Independent with IADLs Employment: Retired   PATIENT GOALS  to improve swallow function, nutrition and hydration, decrease aspiration risks  SUBJECTIVE STATEMENT: Pt eager, states that his wife had to pick up his grandchildren  Pt accompanied by: self    OBJECTIVE:   TODAY'S TREATMENT:  Skilled treatment session focused on pt's dysphagia goals. SLP facilitated the session by providing  the following interventions:  Pt appears to be even thinner than previous session - review of chart reveals 4 lb weight loss since 12/29/2023 - current weight at nephrologist today was 115 lb as compared to weight of 119 lbs on 12/29/2023. Pt also reports increased shortness of breath, continued cognitive slowing and vocal hoarseness. While pt was eager to participate he requires more verbal and written cues to complete activities d/t subjective cognitive slowing each week suspected related to [facility] state. He reports that he has an appointment with nutrition on November 4th.   Secure chat sent to pt's PCP regarding recent weight loss and concern for potential continued decline. Recommend continued discussion regarding alternative forms of nutrition.   Skilled verbal, visual and written instructions provided on pharyngeal strengthening  exercises (as below).  Masako - pt able to attempt Masako correctly - much improved, pt able to complete swallow 8 times for 2 sets -  recommend pt increase to 8 repetitions with 2 sets, 3xday   Chin Tuck Against Resistance (CTAR) - much improved, pt reports able to complete 3 sets of 8 repetitions of CTAR correctly - recommend pt increase to 8 repetitions with 2 sets, 3xday  SLP introduced jaw opening exercise - pt able to complete 2 sets of 5 repetitions, holding for 10 seconds each - recommend pt complete 3xday  Skilled observation was provided on pt consuming thin liquids via cup while using right head turn with chin tuck. Given confusion, pt benefited from verbal cues to implement strategies as he initially consumed with no strategies. Of note he was free of overt s/s of aspiration when consuming without strategies.    PATIENT EDUCATION: Education details: ST POC, repeat MBSS in 8 to 12 weeks Person educated: Patient and Spouse Education method: Chief Technology Officer Education comprehension: needs further education  HOME EXERCISE PROGRAM:     Implement aspiration strategies, compensatory swallow strateges   GOALS: Goals reviewed with patient? Yes  SHORT TERM GOALS: Target date: 10 sessions  With Mod I, pt will perform compensatory swallow strategies (chin tuck, multiple swallows, head turn) to eliminate s/s of aspiration when consuming least restrictive diet. Baseline: Goal status: INITIAL  2.  With Min A, pt will complete (supraglottic, swallow, Mednelson maneuver, effortful swallow) to improve oral motor weakness, tongue base retraction, hyolarnygeal excursion, airway protection and/or clearance of the bolus through the pharynx. Baseline:  Goal status: INITIAL  3. Pt will demonstrate overt s/s of aspiration when consuming ice chips.   Baseline:  Goal status: INITIAL   LONG TERM GOALS: Target date: 03/25/2024  With Mod I, pt will demonstrate the ability to adequately  self-monitor swallowing skills and perform appropriate compensatory techniques to reduce s/s of aspiration and to safely consume least restrictive diet.     Baseline:  Goal status: INITIAL  2.  Pt will participate in repeat instrumental swallow evaluation to assess least restrictive diet.  Baseline:  Goal status: INITIAL   ASSESSMENT:  CLINICAL IMPRESSION: Patient is a 72 y.o. male who was seen today for a clinical swallow evaluation d/t severe pharyngeal phase dysphagia following ACDF (03/2023).    Given significant weight loss and pt concern for being able to participate in PT after this session (due weakened state), this writer had PT session canceled.  See the above treatment note for details.   OBJECTIVE IMPAIRMENTS include dysphagia. These impairments are limiting patient from safety when swallowing. Factors affecting potential to achieve goals and functional outcome are severity of impairments and malnutrition. Patient  will benefit from skilled SLP services to address above impairments and improve overall function.  REHAB POTENTIAL: Good  PLAN: SLP FREQUENCY: 1-2x/week  SLP DURATION: 12 weeks  PLANNED INTERVENTIONS: Aspiration precaution training, Pharyngeal strengthening exercises, Diet toleration management , Oral motor exercises, SLP instruction and feedback, Compensatory strategies, and Patient/family education  Hassell Roys, SLP Student Clinician   Happi B. Rubbie, M.S., CCC-SLP, Tree surgeon Certified Brain Injury Specialist [facility]  [facility] Office [phone] Ascom [phone] Fax [phone]

## 2024-01-10 ENCOUNTER — Encounter: Payer: Self-pay | Admitting: Family Medicine

## 2024-01-10 ENCOUNTER — Ambulatory Visit: Admitting: Pulmonary Disease

## 2024-01-10 ENCOUNTER — Encounter: Payer: Self-pay | Admitting: *Deleted

## 2024-01-10 ENCOUNTER — Other Ambulatory Visit: Payer: Self-pay | Admitting: Family Medicine

## 2024-01-10 DIAGNOSIS — T17900D Unspecified foreign body in respiratory tract, part unspecified causing asphyxiation, subsequent encounter: Secondary | ICD-10-CM

## 2024-01-10 DIAGNOSIS — R1310 Dysphagia, unspecified: Secondary | ICD-10-CM

## 2024-01-10 DIAGNOSIS — R0602 Shortness of breath: Secondary | ICD-10-CM | POA: Diagnosis not present

## 2024-01-10 DIAGNOSIS — T17308A Unspecified foreign body in larynx causing other injury, initial encounter: Secondary | ICD-10-CM

## 2024-01-10 DIAGNOSIS — E440 Moderate protein-calorie malnutrition: Secondary | ICD-10-CM

## 2024-01-10 DIAGNOSIS — R633 Feeding difficulties, unspecified: Secondary | ICD-10-CM

## 2024-01-10 DIAGNOSIS — T17908D Unspecified foreign body in respiratory tract, part unspecified causing other injury, subsequent encounter: Secondary | ICD-10-CM

## 2024-01-10 DIAGNOSIS — R634 Abnormal weight loss: Secondary | ICD-10-CM

## 2024-01-10 LAB — PULMONARY FUNCTION TEST
DL/VA % pred: 87 %
DL/VA: 3.42 ml/min/mmHg/L
DLCO unc % pred: 75 %
DLCO unc: 20.12 ml/min/mmHg
FEF 25-75 Post: 4.63 L/s
FEF 25-75 Pre: 4.85 L/s
FEF2575-%Change-Post: -4 %
FEF2575-%Pred-Post: 184 %
FEF2575-%Pred-Pre: 192 %
FEV1-%Change-Post: -1 %
FEV1-%Pred-Post: 104 %
FEV1-%Pred-Pre: 106 %
FEV1-Post: 3.55 L
FEV1-Pre: 3.62 L
FEV1FVC-%Change-Post: 2 %
FEV1FVC-%Pred-Pre: 125 %
FEV6-%Change-Post: -3 %
FEV6-%Pred-Post: 86 %
FEV6-%Pred-Pre: 89 %
FEV6-Post: 3.79 L
FEV6-Pre: 3.94 L
FEV6FVC-%Pred-Post: 106 %
FEV6FVC-%Pred-Pre: 106 %
FVC-%Change-Post: -4 %
FVC-%Pred-Post: 81 %
FVC-%Pred-Pre: 84 %
FVC-Post: 3.79 L
FVC-Pre: 3.95 L
Post FEV1/FVC ratio: 94 %
Post FEV6/FVC ratio: 100 %
Pre FEV1/FVC ratio: 92 %
Pre FEV6/FVC Ratio: 100 %
RV % pred: 140 %
RV: 3.69 L
TLC % pred: 101 %
TLC: 7.52 L

## 2024-01-10 NOTE — Patient Instructions (Signed)
 Full PFT completed today ? ?

## 2024-01-10 NOTE — Progress Notes (Signed)
 Full PFT completed today ? ?

## 2024-01-11 ENCOUNTER — Ambulatory Visit
Admission: RE | Admit: 2024-01-11 | Discharge: 2024-01-11 | Disposition: A | Discharge status: Home / Self Care | Source: Ambulatory Visit | Attending: Family Medicine | Admitting: Family Medicine

## 2024-01-11 ENCOUNTER — Ambulatory Visit: Payer: Self-pay | Admitting: Family Medicine

## 2024-01-11 ENCOUNTER — Encounter: Payer: Self-pay | Admitting: *Deleted

## 2024-01-11 ENCOUNTER — Ambulatory Visit: Attending: Family Medicine | Admitting: Speech Pathology

## 2024-01-11 DIAGNOSIS — E440 Moderate protein-calorie malnutrition: Secondary | ICD-10-CM | POA: Insufficient documentation

## 2024-01-11 DIAGNOSIS — R1310 Dysphagia, unspecified: Secondary | ICD-10-CM | POA: Diagnosis not present

## 2024-01-11 DIAGNOSIS — R634 Abnormal weight loss: Secondary | ICD-10-CM | POA: Diagnosis not present

## 2024-01-11 DIAGNOSIS — M542 Cervicalgia: Secondary | ICD-10-CM | POA: Diagnosis not present

## 2024-01-11 DIAGNOSIS — N200 Calculus of kidney: Secondary | ICD-10-CM | POA: Diagnosis not present

## 2024-01-11 DIAGNOSIS — R633 Feeding difficulties, unspecified: Secondary | ICD-10-CM | POA: Diagnosis not present

## 2024-01-11 DIAGNOSIS — R1312 Dysphagia, oropharyngeal phase: Secondary | ICD-10-CM | POA: Diagnosis not present

## 2024-01-11 DIAGNOSIS — M546 Pain in thoracic spine: Secondary | ICD-10-CM | POA: Diagnosis not present

## 2024-01-11 DIAGNOSIS — T17900D Unspecified foreign body in respiratory tract, part unspecified causing asphyxiation, subsequent encounter: Secondary | ICD-10-CM | POA: Insufficient documentation

## 2024-01-11 NOTE — Telephone Encounter (Signed)
 AB CT Wo ordered. ENT and nutrition referral changed to STAT

## 2024-01-11 NOTE — Therapy (Signed)
 OUTPATIENT SPEECH LANGUAGE PATHOLOGY  SWALLOW TREATMENT   Patient Name: Shawn Lam MRN: [id number] DOB:July 20, 1950, 74 y.o., male Today's Date: 01/11/2024  PCP: Greig Ring, MD REFERRING PROVIDER: Greig Ring, MD   End of Session - 01/11/24 1037     Visit Number 4    Number of Visits 25    Date for Recertification  03/25/24    Authorization Type Medicare Part A    Progress Note Due on Visit 10    SLP Start Time 1015    SLP Stop Time  1030    SLP Time Calculation (min) 15 min    Activity Tolerance Patient tolerated treatment well;Other (comment)   session ended early to allow pt travel time to Outpatient Imaging for newly scheduled 1130 CT abdomen.         Past Medical History:  Diagnosis Date   Actinic keratosis 02/04/2021   right lateral neck   Anemia    Anxiety    Asymptomatic LV dysfunction    50-55% by echo 06/2016   BCC (basal cell carcinoma of skin) 08/05/2020   left neck infra auricular - nodular pattern - treated with [facility] 09/17/2020   Berger's disease    CAD (coronary artery disease) 06/2005   PCI of LAD and RCA   Depression    Dilated aortic root    4cm at sinus of Valsalva   Hematuria    Hyperlipidemia    Hypothyroidism    Insomnia    Left ureteral stone    Orthostatic hypotension    negative tilt table   OSA (obstructive sleep apnea)    moderate with AHI 17/hr, uses CPAP nightly(01/15/19 - has Inspire implant)   PVC's (premature ventricular contractions) 07/07/2016   Renal disorder    PT IS SEEING DR. BETSEY- NEPHROLOGIST   Rosacea    Urticaria    Past Surgical History:  Procedure Laterality Date   ANTERIOR CERVICAL DECOMP/DISCECTOMY FUSION N/A 03/13/2023   Procedure: CERVICAL FOUR-FIVE, CERVICAL FIVE-SIX ANTERIOR CERVICAL DISCECTOMY/DECOMPRESSION FUSION;  Surgeon: Onetha Kuba, MD;  Location: [facility] OR;  Service: Neurosurgery;  Laterality: N/A;  3C   CARDIAC CATHETERIZATION  09/06/2013   [facility]   CARDIAC CATHETERIZATION     COLONOSCOPY WITH PROPOFOL   N/A 01/21/2019   Procedure: COLONOSCOPY WITH PROPOFOL ;  Surgeon: Jinny Carmine, MD;  Location: [facility];  Service: Endoscopy;  Laterality: N/A;  sleep apnea   COLONOSCOPY WITH PROPOFOL  N/A 10/19/2021   Procedure: COLONOSCOPY WITH PROPOFOL ;  Surgeon: Jinny Carmine, MD;  Location: [facility] ENDOSCOPY;  Service: Endoscopy;  Laterality: N/A;   CORONARY ANGIOPLASTY  2004   STENT PLACEMENT   CYSTOSCOPY WITH RETROGRADE PYELOGRAM, URETEROSCOPY AND STENT PLACEMENT Left 03/19/2014   Procedure: CYSTOSCOPY WITH RETROGRADE PYELOGRAM, URETEROSCOPY AND STENT PLACEMENT;  Surgeon: Mickey Ricardo KATHEE Alvaro, MD;  Location: [facility] ORS;  Service: Urology;  Laterality: Left;   CYSTOSCOPY WITH RETROGRADE PYELOGRAM, URETEROSCOPY AND STENT PLACEMENT Bilateral 05/20/2015   Procedure:  CYSTOSCOPY WITH BILATERAL RETROGRADE PYELOGRAM,RIGHT  DIAGNOSTIC URETEROSCOPY ,LEFT URETEROSCOPY WITH HOLMIUM LASER  AND BILATERAL  STENT PLACEMENT ;  Surgeon: Ricardo Alvaro, MD;  Location: [facility];  Service: Urology;  Laterality: Bilateral;   DRUG INDUCED ENDOSCOPY N/A 10/20/2017   Procedure: DRUG INDUCED SLEEP ENDOSCOPY;  Surgeon: Carlie Clark, MD;  Location:  SURGERY CENTER;  Service: ENT;  Laterality: N/A;   EP IMPLANTABLE DEVICE N/A 05/13/2016   Procedure: Loop Recorder Insertion;  Surgeon: Elspeth JAYSON Sage, MD;  Location: [facility] INVAS[facility] LAB;  Service: Cardiovascular;  Laterality: N/A;  ESOPHAGOGASTRODUODENOSCOPY N/A 10/19/2021   Procedure: ESOPHAGOGASTRODUODENOSCOPY (EGD);  Surgeon: Jinny Carmine, MD;  Location: [facility] ENDOSCOPY;  Service: Endoscopy;  Laterality: N/A;   ESOPHAGOGASTRODUODENOSCOPY (EGD) WITH PROPOFOL  N/A 11/30/2018   Procedure: ESOPHAGOGASTRODUODENOSCOPY (EGD) WITH PROPOFOL ;  Surgeon: Therisa Bi, MD;  Location: [facility] ENDOSCOPY;  Service: Gastroenterology;  Laterality: N/A;   ESOPHAGOGASTRODUODENOSCOPY (EGD) WITH PROPOFOL  N/A 12/01/2018   Procedure: ESOPHAGOGASTRODUODENOSCOPY (EGD) WITH PROPOFOL ;  Surgeon: Jinny Carmine, MD;   Location: [facility] ENDOSCOPY;  Service: Endoscopy;  Laterality: N/A;   HOLMIUM LASER APPLICATION Bilateral 05/20/2015   Procedure: HOLMIUM LASER APPLICATION;  Surgeon: Ricardo Likens, MD;  Location: [facility];  Service: Urology;  Laterality: Bilateral;   IMPLIMENTATION OF A HYPOGLOSSAL NERVE STIMULATOR  12/08/2017   OSA    LEFT HEART CATHETERIZATION WITH CORONARY ANGIOGRAM N/A 09/06/2013   Procedure: LEFT HEART CATHETERIZATION WITH CORONARY ANGIOGRAM;  Surgeon: Victory LELON Claudene DOUGLAS, MD;  Location: [facility] CATH LAB;  Service: Cardiovascular;  Laterality: N/A;   LEFT KNEE CAP  SURGERY ABOUT 40 YRS AGO  ? 1970     STONE EXTRACTION WITH BASKET Left 03/19/2014   Procedure: STONE EXTRACTION WITH BASKET;  Surgeon: Mickey Ricardo KATHEE Likens, MD;  Location: [facility] ORS;  Service: Urology;  Laterality: Left;   TRANSURETHRAL RESECTION OF PROSTATE N/A 08/25/2021   Procedure: TRANSURETHRAL RESECTION OF THE PROSTATE (TURP);  Surgeon: Likens Ricardo, MD;  Location: [facility] ORS;  Service: Urology;  Laterality: N/A;  1 HR   Patient Active Problem List   Diagnosis Date Noted   Fall 01/03/2024   Hematoma of right hip 01/03/2024   Chronic pulmonary aspiration 12/29/2023   Silent aspiration 12/29/2023   Moderate protein-calorie malnutrition (weight for age 72-74% of standard) 12/29/2023   Bilateral leg weakness 12/29/2023   Chronic respiratory failure with hypoxia, on home oxygen  therapy (HCC) 12/29/2023   Chronic constipation 12/29/2023   Somnolence 12/29/2023   Frequent falls 12/29/2023   Feeding difficulties, unspecified 12/20/2023   Shortness of breath 12/20/2023   Abnormal CT of the chest 10/10/2023   Elevated blood sugar 06/30/2023   Carotid stenosis 05/11/2023   Spinal stenosis in cervical region 03/13/2023   Osteoarthritis of spine with radiculopathy, cervical region 07/14/2022   Cervicogenic headache 11/30/2021   Chronic neck pain 11/30/2021   Weight loss, non-intentional    Dysphagia    Polyp of colon    Prostatic  hyperplasia 08/25/2021   Chronic insomnia 03/19/2020   Peripheral neuropathy 03/19/2020   Iron deficiency anemia 01/06/2020   Allergic dermatitis 07/08/2019   Osteoarthritis of left knee 02/26/2019   History of colonic polyps    History of duodenal ulcer    CKD (chronic kidney disease), stage IIIa    Bilateral chronic knee pain 11/09/2018   Epistaxis 10/02/2018   Subjective tinnitus, bilateral 10/02/2018   Chronic pain of right thumb 02/22/2018   Acute bilateral thoracic back pain 01/23/2018   Osteoporosis without current pathological fracture 02/10/2017   Hypothyroidism 11/02/2016   Thoracic compression fracture (HCC) 09/06/2016   PVC's (premature ventricular contractions) 07/07/2016   Hypoalbuminemia 06/28/2016   IgA nephropathy 06/07/2016   Acute buttock pain 05/23/2016   MDD (major depressive disorder), recurrent episode, moderate (HCC) 11/20/2015   OSA (obstructive sleep apnea) 02/05/2014   Orthostatic hypotension 02/07/2013   Scotoma involving central area 01/29/2013   Optic neuropathy 01/28/2013   HEMATURIA UNSPECIFIED 01/21/2010   HLD (hyperlipidemia) 07/05/2006   Generalized anxiety disorder 07/05/2006   CAD (coronary artery disease) 07/05/2006   GERD 07/05/2006    ONSET DATE: 12/*2024 following  ACDF; Date of referral 12/28/2023  REFERRING DIAG:  T17.908D (ICD-10-CM) - Chronic pulmonary aspiration, subsequent encounter  T17.900D (ICD-10-CM) - Silent aspiration, subsequent encounter  E44.0 (ICD-10-CM) - Moderate protein-calorie malnutrition  R13.10 (ICD-10-CM) - Dysphagia, unspecified type    THERAPY DIAG:  Dysphagia, oropharyngeal phase  Rationale for Evaluation and Treatment Rehabilitation  SUBJECTIVE:   PERTINENT HISTORY and DIAGNOSTIC FINDINGS:  Pt is a 73 yo male w/ ACDF surgery 03/2023.  PMH: GERD(Not on a PPI per chart), Actinic keratosis (02/04/2021), Anemia, Anxiety, Asymptomatic LV dysfunction, BCC (basal cell carcinoma of skin) (08/05/2020),  Berger's disease, CAD (coronary artery disease) (06/2005), resting tremor, Depression, Dilated aortic root (HCC), Hematuria, Hyperlipidemia, Hypothyroidism, Insomnia, Left ureteral stone, Orthostatic hypotension, OSA (obstructive sleep apnea), PVC's (premature ventricular contractions) (07/07/2016), Renal disorder, Rosacea, and Urticaria.  Pt is followed by Cardiology for Coronary Artery Stenosis currently; also followed for Migraines.     EGD in 2023 revealed Gastritis.      CT of Chest 10/2023: 1. Interim development of patchy consolidation and ground-glass   disease most evident at the right middle lobe and right lower lobes   with additional areas of mild ground-glass disease and scattered   clustered nodularity, findings are suspicious for multifocal   infection/pneumonia. Imaging follow-up to resolution is recommended.   2. Emphysema.   3. Aortic atherosclerosis.            Dysphagia - reported by pt as Chronic, ever since neck surgery in December 2024. Pt was seen in the emergency room s/p Fall, notes detailing that he slipped on soap in the shower, fell backwards on right side hit head and neck against shower seat, possible brief loss of consciousness, per chart.  ACDF 03/2023 per chart.  Since the neck surgery in 03/2023, he has noted food, liquids and pill getting stuck on the right side of my throat more.  I used to cough a lot more but not as much now.  I cough up what gets stuck..  He has lost significant weight per chart notes due to decreased eating.  Pt endorsed using his home O2 when he feels SOB.   MBSS 12/26/2023 Patient appears to present w/ Chronic, moderate-severe Pharyngeal phase dysphagia per this assessment today. It appeared there was some impact from changes in the Cervical Esophagus s/p ACDF surgery(03/2023 per chart notes) including structural/tissue changes in/aournd the CP segment and cervical esophagus. Pt with intermittent silent aspiration of thin liquids.    Recommend continue current diet of easy to eat foods at home; thin liquids via cup- at this time. Oral care Before any oral intake. Swallow Strategies Recommended: No straws, moistened foods, SMALL bites/sips, Head Turn RIGHT w/ slight chin tuck, multiple swallows to aid clearing, AND strong Cough/throat clear b/t bites/sips and at end of meal.  D/t the Chronicity of pt's Pharyngeal phase Dysphagia w/ weight loss and pulmonary impact per recent Chest CT, pt and Family may need to discuss alternative means of nutrition/hydration as support w/ his Medical Team/PCP, while seeking swallow therapy.  PAIN:  Are you having pain? No  FALLS: Has patient fallen in last 6 months?  See PT evaluation for details  LIVING ENVIRONMENT: Lives with: lives with their spouse Lives in: House/apartment  PLOF:  Level of assistance: Independent with ADLs, Independent with IADLs Employment: Retired   PATIENT GOALS  to improve swallow function, nutrition and hydration, decrease aspiration risks  SUBJECTIVE STATEMENT: Pt eager, provides information about upcoming appt with pulmonology Pt accompanied by: self  OBJECTIVE:   TODAY'S TREATMENT:  Skilled treatment session focused on pt's dysphagia goals. SLP facilitated the session by providing the following interventions:  Pt provides update I am waiting to get calls from 2 more people. Dr Avelina sent me to another nutrition person so that I might be able to get seen quicker. This Clinical research associate reviewed pt's chart and saw new order for CT abdomen. With prompts, pt able to recall this information and said well that is 3 people now that I am waiting to call me. This writer was able to reach out to centralized scheduling and there were several appts available to have today. Pt choose 11:30am -  I might as well do it now while I am already here. Information on scan, location, appt time, referring MD and this writer's phone number were written down for pt. In  addition, location was put into pt's GPS and saved to better help pt locate [facility] at [facility] [address] [place] [address]  [phone].    From an oropharyngeal perspective, pt reports continued difficulty with PO intake and reduced intake.   PATIENT EDUCATION: Education details: ST POC, repeat MBSS in 8 to 12 weeks Person educated: Patient and Spouse Education method: Chief Technology Officer Education comprehension: needs further education  HOME EXERCISE PROGRAM:     Implement aspiration strategies, compensatory swallow strateges   GOALS: Goals reviewed with patient? Yes  SHORT TERM GOALS: Target date: 10 sessions  With Mod I, pt will perform compensatory swallow strategies (chin tuck, multiple swallows, head turn) to eliminate s/s of aspiration when consuming least restrictive diet. Baseline: Goal status: INITIAL  2.  With Min A, pt will complete (supraglottic, swallow, Mednelson maneuver, effortful swallow) to improve oral motor weakness, tongue base retraction, hyolarnygeal excursion, airway protection and/or clearance of the bolus through the pharynx. Baseline:  Goal status: INITIAL  3. Pt will demonstrate overt s/s of aspiration when consuming ice chips.   Baseline:  Goal status: INITIAL   LONG TERM GOALS: Target date: 03/25/2024  With Mod I, pt will demonstrate the ability to adequately self-monitor swallowing skills and perform appropriate compensatory techniques to reduce s/s of aspiration and to safely consume least restrictive diet.     Baseline:  Goal status: INITIAL  2.  Pt will participate in repeat instrumental swallow evaluation to assess least restrictive diet.  Baseline:  Goal status: INITIAL   ASSESSMENT:  CLINICAL IMPRESSION: Patient is a 73 y.o. male who was seen today for a clinical swallow evaluation d/t severe pharyngeal phase dysphagia following ACDF (03/2023).    See the above treatment note for  details.   OBJECTIVE IMPAIRMENTS include dysphagia. These impairments are limiting patient from safety when swallowing. Factors affecting potential to achieve goals and functional outcome are severity of impairments and malnutrition. Patient will benefit from skilled SLP services to address above impairments and improve overall function.  REHAB POTENTIAL: Good  PLAN: SLP FREQUENCY: 1-2x/week  SLP DURATION: 12 weeks  PLANNED INTERVENTIONS: Aspiration precaution training, Pharyngeal strengthening exercises, Diet toleration management , Oral motor exercises, SLP instruction and feedback, Compensatory strategies, and Patient/family education   Jacquel Redditt B. Rubbie, M.S., CCC-SLP, Tree surgeon Certified Brain Injury Specialist [facility]  [facility] Services Office [phone] Ascom [phone] Fax [phone]

## 2024-01-12 ENCOUNTER — Encounter: Payer: Self-pay | Admitting: Pulmonary Disease

## 2024-01-12 ENCOUNTER — Telehealth: Payer: Self-pay | Admitting: Family Medicine

## 2024-01-12 ENCOUNTER — Ambulatory Visit (INDEPENDENT_AMBULATORY_CARE_PROVIDER_SITE_OTHER): Admitting: Pulmonary Disease

## 2024-01-12 ENCOUNTER — Telehealth (HOSPITAL_BASED_OUTPATIENT_CLINIC_OR_DEPARTMENT_OTHER): Payer: Self-pay | Admitting: *Deleted

## 2024-01-12 VITALS — BP 110/72 | HR 81 | Temp 98.0°F | Ht 72.0 in | Wt 116.6 lb

## 2024-01-12 DIAGNOSIS — R0602 Shortness of breath: Secondary | ICD-10-CM

## 2024-01-12 DIAGNOSIS — Z955 Presence of coronary angioplasty implant and graft: Secondary | ICD-10-CM

## 2024-01-12 DIAGNOSIS — R1314 Dysphagia, pharyngoesophageal phase: Secondary | ICD-10-CM | POA: Diagnosis not present

## 2024-01-12 DIAGNOSIS — R1312 Dysphagia, oropharyngeal phase: Secondary | ICD-10-CM | POA: Diagnosis not present

## 2024-01-12 DIAGNOSIS — M6250 Muscle wasting and atrophy, not elsewhere classified, unspecified site: Secondary | ICD-10-CM | POA: Diagnosis not present

## 2024-01-12 DIAGNOSIS — R0609 Other forms of dyspnea: Secondary | ICD-10-CM

## 2024-01-12 DIAGNOSIS — G4733 Obstructive sleep apnea (adult) (pediatric): Secondary | ICD-10-CM

## 2024-01-12 DIAGNOSIS — R490 Dysphonia: Secondary | ICD-10-CM | POA: Diagnosis not present

## 2024-01-12 DIAGNOSIS — J9589 Other postprocedural complications and disorders of respiratory system, not elsewhere classified: Secondary | ICD-10-CM | POA: Diagnosis not present

## 2024-01-12 DIAGNOSIS — R9389 Abnormal findings on diagnostic imaging of other specified body structures: Secondary | ICD-10-CM

## 2024-01-12 DIAGNOSIS — T17908D Unspecified foreign body in respiratory tract, part unspecified causing other injury, subsequent encounter: Secondary | ICD-10-CM

## 2024-01-12 NOTE — Telephone Encounter (Signed)
 Dr.Gollan,  You saw this patient on 12/25/2023. Per protocol we request that you comment on his cardiac risk to proceed with  Gastrostomy tube insertion by Interventional Radiology since it has been less than 2 months since evaluated in the office. Please send your comment to P CV Pre-Op Pool.  Thank you, [name] DNP, ANP, AACC.

## 2024-01-12 NOTE — Patient Instructions (Signed)
 VISIT SUMMARY:  During your visit, we discussed your ongoing shortness of breath, which is particularly noticeable when you stand up or exert yourself. We also reviewed your history of silent aspiration, coronary artery disease, and muscle wasting. We have planned several tests and follow-ups to better understand and manage your symptoms.  YOUR PLAN:  -SHORTNESS OF BREATH (DYSPNEA): Your shortness of breath is likely related to muscle weakness and potential heart issues. We will add a specific test to your upcoming echocardiogram to assess this and conduct a walk test to evaluate your oxygen  levels and breathlessness during exertion.  -SUSPECTED PULMONARY HYPERTENSION: Pulmonary hypertension is high blood pressure in the arteries that supply your lungs. We will ensure your echocardiogram includes an assessment for this condition.  -CORONARY ARTERY DISEASE, STATUS POST STENTS: You have coronary artery disease and have had two stents placed. We will coordinate with your cardiologist to ensure a thorough evaluation of your heart function.  -DYSPHAGIA WITH SILENT ASPIRATION: Dysphagia means difficulty swallowing, and silent aspiration means food or liquid enters your lungs without causing coughing. We will reassess the need for a feeding tube based on evaluations from your ENT specialist and speech pathologist. Please continue to follow the speech pathologist's recommendations for your diet.  -MUSCLE WASTING (MUSCLE ATROPHY): Muscle wasting is the loss of muscle mass, which can contribute to fatigue and shortness of breath. We suggest a neurological evaluation to explore the causes and encourage you to maintain adequate protein intake to help improve your muscle mass.  INSTRUCTIONS:  Please follow up with your cardiologist for a comprehensive cardiac evaluation and ensure your echocardiogram includes the specific tests we discussed. Continue to follow the recommendations from your speech pathologist  regarding your diet. We will reassess the need for a feeding tube based on upcoming evaluations from your ENT specialist and speech pathologist. Additionally, consider a neurological evaluation to address muscle atrophy and maintain adequate protein intake to support muscle mass.

## 2024-01-12 NOTE — Telephone Encounter (Signed)
 Spoke with patient and wife in detail today.  Shawn Lam saw Dr. Milissa ENT this afternoon.  Told there is a large structural issue with the vocal cords with separation that would need to be repaired.  Dr. Princeton was hopeful there could be improvement with aspiration after this repair.  He referred the patient to [facility] to a specialist, possibly Dr. Brien?  Dr. Milissa agreed with PEG tube placement in the meantime.  Patient wishes to proceed. Discussed in detail with patient and wife regarding risks and benefits of PEG tube placement.  He understands risks. Patient would likely need PEG tube placement for 3 to 4 months, he cannot sustain life from a nutritional aspect without a G-tube.  He plans to hold his anticoagulants starting October 4.  On Monday, I plan to contact [name] in scheduling for IR PEG placement, earliest this could be done would be Thursday given need for 5 days anticoagulant hold.  I plan to fax then a referral/necessity statement for tube feeds to [facility] or RD.

## 2024-01-12 NOTE — Telephone Encounter (Signed)
-----   Message from Wyn Raddle, Jackee Shove sent at 01/12/2024 12:53 PM EDT ----- Regarding: FW: Med clearance  ----- Message ----- From: Delight Bebe SAILOR Sent: 01/12/2024  12:33 PM EDT To: Clarita GORMAN Ricker; Cv Div Preop Subject: Med clearance                                  Good afternoon,   The attached patient has been referred for a Gastrostomy tube insertion by Interventional Radiology from Dr. Philip. Records indicate that the patient is taking Plavix  that is prescribed and monitored by Tylene Lunch, NP. Interventional Radiology would like to obtain a medical clearance which will require the patient to hold the medication for 5 days prior to the procedure. We will need medical clearance for the patient to hold medication before the procedure can be scheduled.  Thank you,  Nol Mackovic [phone]  Please call myself or [name] [phone] with any questions or concerns.

## 2024-01-12 NOTE — Telephone Encounter (Signed)
   Pre-operative Risk Assessment    Patient Name: Shawn Lam  DOB: 1951/01/15 MRN: [id number]   Date of last office visit: 12/25/23 DR. GOLLAN Date of next office visit: NONE   Request for Surgical Clearance    Procedure:  Gastrostomy tube insertion by Interventional Radiology  Date of Surgery:  Clearance TBD                                Surgeon:  DR. CHARLIE N MACKOVIC  Surgeon's Group or Practice Name:   Phone number:  [phone] Fax number:  LEFT MESSAGE TO CALL BACK WITH FAX#    Type of Clearance Requested:   - Medical  - Pharmacy:  Hold Clopidogrel  (Plavix ) x 5 DAYS PRIOR PER IR    Type of Anesthesia:  Not Indicated; LEFT MESSAGE TO CALL BACK WITH TYPE OF ANESTHESIA TO BE USED    Additional requests/questions:    Signed, Damico Partin   01/12/2024, 1:38 PM

## 2024-01-12 NOTE — Progress Notes (Signed)
 Subjective:    Patient ID: Shawn Lam, male    DOB: [birth year], 73 y.o.   MRN: [id number]  Patient Care Team: Avelina Greig BRAVO, MD as PCP - General Perla Evalene PARAS, MD as PCP - Cardiology (Cardiology) Fate Morna SAILOR, [facility] (Inactive) as Pharmacist (Pharmacist) Laurice Francis NOVAK, [place] (Optometry) Onetha Kuba, MD as Consulting Physician (Neurosurgery)  Chief Complaint  Patient presents with   Shortness of Breath    BACKGROUND/INTERVAL:Patient is a 73 year old lifelong never smoker who presents for full follow-up of an abnormal CT scan of the chest obtained on 08 November 2023.   HPI Discussed the use of AI scribe software for clinical note transcription with the patient, who gave verbal consent to proceed.  History of Present Illness   Shawn Lam is a 73 year old male with silent aspiration who presents with shortness of breath.  He experiences persistent shortness of breath, particularly noticeable when standing up from a seated position, causing imbalance and breathlessness. Improvement is noted after moving around. Episodes also occur when walking, such as getting out of the car, described as 'I can't half breathe'. He uses home oxygen , initiated post-COVID-19 hospitalization, which helps during these episodes.  He has a history of silent aspiration, chest CT scan performed on 08 November 2023 was concerning for aspiration which triggered a swallow evaluation which showed that the patient does aspirate. He follows speech pathologist recommendations regarding food intake.  Speech pathology is concern that he needs a feeding tube for nutrition.  Follow-up CT of the abdomen performed yesterday shows some improvement on the prior changes from his chest CT on the lower lung zones that were captured on the film.  Again this is consistent with aspiration.  He has two heart stents and is scheduled for an echocardiogram to evaluate his heart condition further. He has seen a vascular  doctor and has an upcoming neurologist appointment to evaluate muscle-related issues.  He reports changes in speech, possibly related to muscle issues. He has been evaluated by a specialist and has an upcoming appointment with an ear, nose, and throat specialist for further assessment.   He had PFTs on 10 January 2024 which where essentially normal except for a very mild diffusion defect which corrected by alveolar volume to normal.  Entirely benign pulmonary wise.  This does not explain his dyspnea.  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  Tiny right renal stone.  Aortic atherosclerosis. 11/08/2023 CT chest without contrast: Patchy consolidation and ground glass disease most evident at the right middle lobe and right lower lobes with additional areas of mild ground glass disease and scattered clustered nodularity.  Findings are suspicious for multifocal infection/pneumonia, imaging follow-up to resolution is recommended.  On independent review this is consistent with chronic aspiration.  Emphysema. 01/10/2024 PFTs: FEV1 3.62 L or 106% predicted, FVC 3.95 L or 84% predicted, FEV1/FVC 92%, lung volumes normal.  No bronchodilator response.  Diffusion capacity minimally reduced corrects to normal by alveolar volume.  Overall normal study.  Does not explain patient's dyspnea.   Review of Systems A 10 point review of systems was performed and it is as noted above otherwise negative.   Patient Active Problem List   Diagnosis Date Noted   Fall 01/03/2024   Hematoma of right hip 01/03/2024   Chronic pulmonary aspiration 12/29/2023   Silent aspiration 12/29/2023   Moderate protein-calorie malnutrition (  weight for age 61-74% of standard) 12/29/2023   Bilateral leg weakness 12/29/2023   Chronic respiratory failure with hypoxia, on home oxygen   therapy (HCC) 12/29/2023   Chronic constipation 12/29/2023   Somnolence 12/29/2023   Frequent falls 12/29/2023   Feeding difficulties, unspecified 12/20/2023   Shortness of breath 12/20/2023   Abnormal CT of the chest 10/10/2023   Elevated blood sugar 06/30/2023   Carotid stenosis 05/11/2023   Spinal stenosis in cervical region 03/13/2023   Osteoarthritis of spine with radiculopathy, cervical region 07/14/2022   Cervicogenic headache 11/30/2021   Chronic neck pain 11/30/2021   Weight loss, non-intentional    Dysphagia    Polyp of colon    Prostatic hyperplasia 08/25/2021   Chronic insomnia 03/19/2020   Peripheral neuropathy 03/19/2020   Iron deficiency anemia 01/06/2020   Allergic dermatitis 07/08/2019   Osteoarthritis of left knee 02/26/2019   History of colonic polyps    History of duodenal ulcer    CKD (chronic kidney disease), stage IIIa    Bilateral chronic knee pain 11/09/2018   Epistaxis 10/02/2018   Subjective tinnitus, bilateral 10/02/2018   Chronic pain of right thumb 02/22/2018   Acute bilateral thoracic back pain 01/23/2018   Osteoporosis without current pathological fracture 02/10/2017   Hypothyroidism 11/02/2016   Thoracic compression fracture (HCC) 09/06/2016   PVC's (premature ventricular contractions) 07/07/2016   Hypoalbuminemia 06/28/2016   IgA nephropathy 06/07/2016   Acute buttock pain 05/23/2016   MDD (major depressive disorder), recurrent episode, moderate (HCC) 11/20/2015   OSA (obstructive sleep apnea) 02/05/2014   Orthostatic hypotension 02/07/2013   Scotoma involving central area 01/29/2013   Optic neuropathy 01/28/2013   HEMATURIA UNSPECIFIED 01/21/2010   HLD (hyperlipidemia) 07/05/2006   Generalized anxiety disorder 07/05/2006   CAD (coronary artery disease) 07/05/2006   GERD 07/05/2006    Social History   Tobacco Use   Smoking status: Never   Smokeless tobacco: Never  Substance Use Topics   Alcohol use: Not Currently     Alcohol/week: 1.0 standard drink of alcohol    Types: 1 Cans of beer per week    Allergies  Allergen Reactions   Codeine Nausea Only and Nausea And Vomiting   Tape Other (See Comments)    Use only paper tape. Severe blistering and bruising with other tapes. No cloth tape.    Current Meds  Medication Sig   aspirin  EC 81 MG tablet Take 81 mg by mouth daily.   atorvastatin  (LIPITOR ) 80 MG tablet TAKE 1 TABLET BY MOUTH DAILY   baclofen (LIORESAL) 10 MG tablet Take 10 mg by mouth 2 (two) times daily.   buPROPion  (WELLBUTRIN  XL) 150 MG 24 hr tablet TAKE 3 TABLETS BY MOUTH DAILY   busPIRone  (BUSPAR ) 10 MG tablet TAKE 2 TABLETS BY MOUTH 3 TIMES A DAY   Cholecalciferol  (VITAMIN D3) 50 MCG (2000 UT) TABS Take 2,000 Units by mouth daily.   clopidogrel  (PLAVIX ) 75 MG tablet TAKE 1 TABLET BY MOUTH DAILY   cyanocobalamin  (VITAMIN B12) 1000 MCG tablet Take 1,000 mcg by mouth daily.   denosumab  (PROLIA ) 60 MG/ML SOSY injection Inject 60 mg into the skin every 6 (six) months. Do not send RX, order this from the office   DULoxetine  (CYMBALTA ) 30 MG capsule TAKE THREE CAPSULES BY MOUTH DAILY   Erenumab-aooe (AIMOVIG) 140 MG/ML SOAJ 140 mg every 28 (twenty-eight) days.   finasteride  (PROSCAR ) 5 MG tablet Take 5 mg by mouth daily.   levothyroxine  (SYNTHROID ) 88 MCG tablet TAKE 1 TABLET BY MOUTH  DAILY BEFORE BREAKFAST   losartan  (COZAAR ) 50 MG tablet Take 1 tablet (50 mg total) by mouth daily.   Magnesium  250 MG CAPS Take 250 mg by mouth daily.   metoCLOPramide (REGLAN) 10 MG tablet Take 10 mg by mouth every 8 (eight) hours as needed.   metoprolol  succinate (TOPROL  XL) 25 MG 24 hr tablet Take 1 tablet (25 mg total) by mouth daily.   nitroGLYCERIN  (NITROSTAT ) 0.4 MG SL tablet Place 1 tablet (0.4 mg total) under the tongue every 5 (five) minutes as needed for chest pain.   triamcinolone  cream (KENALOG ) 0.1 % APPLY 2 TOPICALLY DAILY , DO NOT APPLY ON DACE , GROIN AND UNDERARMS.   zolpidem  (AMBIEN ) 5 MG tablet  Take 1 tablet (5 mg total) by mouth at bedtime as needed.   zonisamide  (ZONEGRAN ) 25 MG capsule Take 1 capsule (25 mg total) by mouth daily.   Current Facility-Administered Medications for the 01/12/24 encounter (Office Visit) with Tamea Dedra CROME, MD  Medication   denosumab  (PROLIA ) injection 60 mg   [START ON 06/30/2024] denosumab  (PROLIA ) injection 60 mg    Immunization History  Administered Date(s) Administered   Fluad Quad(high Dose 65+) 12/04/2018, 01/01/2020   INFLUENZA, HIGH DOSE SEASONAL PF 01/04/2021   Influenza Split 01/04/2011, 01/27/2012   Influenza Whole 01/09/2008, 03/04/2009, 12/27/2009   Influenza,inj,Quad PF,6+ Mos 01/31/2013, 01/31/2014, 02/03/2015, 02/09/2016, 01/20/2017, 01/23/2018   Influenza-Unspecified 12/08/2021, 12/14/2022   PFIZER Comirnaty(Gray Top)Covid-19 Tri-Sucrose Vaccine 05/04/2020   PFIZER(Purple Top)SARS-COV-2 Vaccination 06/01/2019, 06/25/2019   Pneumococcal Conjugate-13 02/09/2016   Pneumococcal Polysaccharide-23 02/23/2017   Td 06/05/2007   Tdap 03/13/2019   Zoster Recombinant(Shingrix) 10/13/2021, 12/16/2021   Zoster, Live 01/25/2011        Objective:     BP 110/72   Pulse 81   Temp 98 F (36.7 C) (Temporal)   Ht 6' (1.829 m)   Wt 116 lb 9.6 oz (52.9 kg)   SpO2 93%   BMI 15.81 kg/m   SpO2: 93 %  GENERAL: Cachectic gentleman, no acute distress, wet sounding voice with almost constant throat clearing. HEAD: Normocephalic, atraumatic.  EYES: Pupils equal, round, reactive to light.  No scleral icterus.  MOUTH: Oral mucosa moist. NECK: Limited range of motion. No thyromegaly. Trachea midline. No JVD.  No adenopathy. PULMONARY: Good air entry bilaterally.  No adventitious sounds. CARDIOVASCULAR: S1 and S2. Regular rate and rhythm.  No rubs, murmurs or gallops heard. ABDOMEN: Scaphoid, otherwise benign. MUSCULOSKELETAL: No joint deformity, no clubbing, no edema.  Significant muscle wasting noted. NEUROLOGIC: Swallowing appears  impaired.  No overt gait disturbance. SKIN: Intact,warm,dry. PSYCH: Mood and behavior normal.  Ambulatory oxymetry was performed today:  At rest on room air oxygen  saturation was 100%, the patient ambulated at a normal pace, completed 3 laps, O2 nadir 100%, mild shortness of breath.  Resting heart rate was 65 bpm at maximum for this exercise 82 bpm.       Assessment & Plan:     ICD-10-CM   1. Chronic pulmonary aspiration, subsequent encounter  T17.908D     2. Oropharyngeal dysphagia  R13.12     3. Platypnea  R06.09 ECHOCARDIOGRAM COMPLETE    4. Shortness of breath  R06.02     5. OSA (obstructive sleep apnea)  G47.33     6. Abnormal CT of the chest  R93.89       Orders Placed This Encounter  Procedures   ECHOCARDIOGRAM COMPLETE    Standing Status:   Future    Expected Date:  01/19/2024    Expiration Date:   01/11/2025    Scheduling Instructions:     Please perform bubble study.  PFO suspected.    Where should this test be performed[facility]    Does the patient weigh less than or greater than 250 lbs?:   Patient weighs less than 250 lbs    Perflutren DEFINITY (image enhancing agent) should be administered unless hypersensitivity or allergy exist:   Administer Perflutren    Reason for exam-Echo:   Evaluation for PFO  Q21.1   Discussion:    Shortness of breath (dyspnea) Dyspnea primarily related to muscle weakness and potential cardiac issues. Lungs functioning adequately. Symptoms include breathlessness upon standing (platypnea) and exertion, improving with movement. Oxygen  used occasionally, initially prescribed post-COVID hospitalization. Echocardiogram scheduled to assess cardiac function and potential communication between heart chambers contributing to dyspnea. - Add bubble study to upcoming echocardiogram to assess shortness of breath upon standing - Conduct walk test to assess oxygen  saturation and exertional dyspnea, no desaturations noted patient  remained at 100%  Suspected pulmonary hypertension Suspected due to prior echocardiogram findings.  Repeat echocardiogram scheduled to further evaluate this condition. - Ensure echocardiogram includes assessment for pulmonary hypertension  Coronary artery disease, status post stents Coronary artery disease with two stents placed. Concerns about cardiac function contributing to symptoms. Coordination with cardiology to ensure comprehensive cardiac evaluation. - Coordinate with cardiology for comprehensive cardiac evaluation  Dysphagia with silent aspiration Dysphagia with silent aspiration. Recent abdominal scan shows improvement in inflammation or infection, with no signs of cancer. Feeding tube considered to improve nutritional status and muscle mass, potentially aiding swallowing issues. Risks of aspiration into the lungs discussed. Decision on feeding tube placement to be reassessed based on progress with speech therapy and findings from ENT and speech pathologist. - Reassess need for feeding tube based on ENT and speech pathologist evaluations - Follow speech pathologist's recommendations for diet consistency  Muscle wasting (muscle atrophy) Muscle wasting contributing to fatigue and shortness of breath. Neurological evaluation suggested to explore potential causes of muscle atrophy. Adequate protein intake and muscle mass improvement may help with overall strength and swallowing issues. - Consider neurological evaluation to assess muscle atrophy - Encourage adequate protein intake to improve muscle mass     Discussed with Dr. Greig Ring, patient's primary physician, via telephone conversation.  Advised if symptoms do not improve or worsen, to please contact office for sooner follow up or seek emergency care.    I spent 42 minutes of dedicated to the care of this patient on the date of this encounter to include pre-visit review of records, face-to-face time with the patient discussing  conditions above, post visit ordering of testing, clinical documentation with the electronic health record, making appropriate referrals as documented, and communicating necessary findings to members of the patients care team.     C. Leita Sanders, MD Advanced Bronchoscopy PCCM [facility]    *This note was generated using voice recognition software/Dragon and/or AI transcription program.  Despite best efforts to proofread, errors can occur which can change the meaning. Any transcriptional errors that result from this process are unintentional and may not be fully corrected at the time of dictation.

## 2024-01-15 ENCOUNTER — Telehealth: Payer: Self-pay | Admitting: *Deleted

## 2024-01-15 ENCOUNTER — Other Ambulatory Visit: Payer: Self-pay | Admitting: Emergency Medicine

## 2024-01-15 ENCOUNTER — Other Ambulatory Visit: Payer: Self-pay | Admitting: Family Medicine

## 2024-01-15 ENCOUNTER — Other Ambulatory Visit: Payer: Self-pay | Admitting: Cardiovascular Disease

## 2024-01-15 ENCOUNTER — Telehealth: Payer: Self-pay | Admitting: Cardiovascular Disease

## 2024-01-15 ENCOUNTER — Other Ambulatory Visit: Payer: Self-pay | Admitting: Pulmonary Disease

## 2024-01-15 DIAGNOSIS — R809 Proteinuria, unspecified: Secondary | ICD-10-CM | POA: Diagnosis not present

## 2024-01-15 DIAGNOSIS — N182 Chronic kidney disease, stage 2 (mild): Secondary | ICD-10-CM | POA: Diagnosis not present

## 2024-01-15 DIAGNOSIS — R0602 Shortness of breath: Secondary | ICD-10-CM

## 2024-01-15 DIAGNOSIS — R0609 Other forms of dyspnea: Secondary | ICD-10-CM

## 2024-01-15 DIAGNOSIS — N2581 Secondary hyperparathyroidism of renal origin: Secondary | ICD-10-CM | POA: Diagnosis not present

## 2024-01-15 NOTE — Telephone Encounter (Signed)
 Copied from CRM #[id number]. Topic: General - Other >> Jan 15, 2024 11:47 AM Vena HERO wrote: Reason for CRM: Ally from [facility] calling to to verify that she sent over a test request and would like confirmation of receipt at [phone] (direct line)

## 2024-01-15 NOTE — Telephone Encounter (Signed)
-----   Message from Nurse Olivia HERO sent at 01/15/2024  1:48 PM EDT ----- Regarding: FW: Adding Buuble study I have changed the order to echo with bubble study ----- Message ----- From: Perla Evalene PARAS, MD Sent: 01/15/2024  11:52 AM EDT To: Olivia PARAS Lager, RN Subject: RE: Adding Buuble study                      That is fine Thx TGollan ----- Message ----- From: Lager Olivia PARAS, RN Sent: 01/15/2024   9:35 AM EDT To: Evalene PARAS Perla, MD; Olivia PARAS Lager, RN Subject: FW: Adding Buuble study                      Can I change the order to add bubble study? ----- Message ----- From: Agapito Audrene SAILOR Sent: 01/15/2024   9:25 AM EDT To: Olivia PARAS Lager, RN Subject: FW: Adding Buuble study                      Please change echo order. ----- Message ----- From: Hannah Marcus KIDD, RN Sent: 01/12/2024   5:44 PM EDT To: Evalene PARAS Perla, MD; Samule LITTIE Bristol; D# Subject: Adding Buuble study                          Hi all, I was contacted by Dr Tamea (pulmonary) to amend Dr Tresia order for this pt's Echo to be changed to Echo with bubble study. Can you please cancel the regular echo and replace with the new order?  Thank you, Marcus   TG: sir, I just to keep you in the loop with Dr Evalyn request - I think you saw pt 2-3 weeks back

## 2024-01-15 NOTE — Telephone Encounter (Signed)
 Last office visit 12/29/23 for Medical management of chronic issues.  Last refilled 12/18/23 for #30 with no refills.  Next Appt: CPE 04/09/2024.

## 2024-01-15 NOTE — Telephone Encounter (Signed)
 Ally notified orders were received.

## 2024-01-15 NOTE — Telephone Encounter (Signed)
 LVM to schedule Echo bubble appt.

## 2024-01-15 NOTE — Progress Notes (Signed)
 Echo ordered. Bubble study.

## 2024-01-16 ENCOUNTER — Telehealth: Payer: Self-pay | Admitting: Family Medicine

## 2024-01-16 ENCOUNTER — Other Ambulatory Visit: Payer: Self-pay | Admitting: Family Medicine

## 2024-01-16 ENCOUNTER — Encounter: Payer: Self-pay | Admitting: Family Medicine

## 2024-01-16 ENCOUNTER — Ambulatory Visit: Admitting: Speech Pathology

## 2024-01-16 ENCOUNTER — Encounter: Admitting: Physical Therapy

## 2024-01-16 ENCOUNTER — Ambulatory Visit: Admitting: Physical Therapy

## 2024-01-16 DIAGNOSIS — R1312 Dysphagia, oropharyngeal phase: Secondary | ICD-10-CM

## 2024-01-16 DIAGNOSIS — R1310 Dysphagia, unspecified: Secondary | ICD-10-CM

## 2024-01-16 DIAGNOSIS — M542 Cervicalgia: Secondary | ICD-10-CM

## 2024-01-16 DIAGNOSIS — Z23 Encounter for immunization: Secondary | ICD-10-CM | POA: Diagnosis not present

## 2024-01-16 DIAGNOSIS — R633 Feeding difficulties, unspecified: Secondary | ICD-10-CM

## 2024-01-16 DIAGNOSIS — E440 Moderate protein-calorie malnutrition: Secondary | ICD-10-CM

## 2024-01-16 DIAGNOSIS — T17900D Unspecified foreign body in respiratory tract, part unspecified causing asphyxiation, subsequent encounter: Secondary | ICD-10-CM

## 2024-01-16 DIAGNOSIS — M546 Pain in thoracic spine: Secondary | ICD-10-CM

## 2024-01-16 DIAGNOSIS — T17908D Unspecified foreign body in respiratory tract, part unspecified causing other injury, subsequent encounter: Secondary | ICD-10-CM

## 2024-01-16 MED ORDER — ZOLPIDEM TARTRATE 10 MG PO TABS: 10.0000 mg | ORAL_TABLET | Freq: Every evening | ORAL | 0 refills | Status: DC | PRN

## 2024-01-16 NOTE — Telephone Encounter (Signed)
 See MyChart message.  Patient is asking to go back up to Zolpidem  10 mg

## 2024-01-16 NOTE — Therapy (Signed)
 OUTPATIENT PHYSICAL THERAPY TREATMENT     Patient Name: Shawn Lam MRN: [id number] DOB:23-Jun-1950, 73 y.o., male Today's Date: 01/16/2024  END OF SESSION:  PT End of Session - 01/16/24 1021     Visit Number 5    Number of Visits 24    Date for Recertification  03/13/24    Authorization Type Medicare 2025    Authorization - Number of Visits 24    Progress Note Due on Visit 10    PT Start Time 1020    PT Stop Time 1100    PT Time Calculation (min) 40 min    Activity Tolerance Patient tolerated treatment well;No increased pain           Past Medical History:  Diagnosis Date   Actinic keratosis 02/04/2021   right lateral neck   Anemia    Anxiety    Asymptomatic LV dysfunction    50-55% by echo 06/2016   BCC (basal cell carcinoma of skin) 08/05/2020   left neck infra auricular - nodular pattern - treated with [facility] 09/17/2020   Berger's disease    CAD (coronary artery disease) 06/2005   PCI of LAD and RCA   Depression    Dilated aortic root    4cm at sinus of Valsalva   Hematuria    Hyperlipidemia    Hypothyroidism    Insomnia    Left ureteral stone    Orthostatic hypotension    negative tilt table   OSA (obstructive sleep apnea)    moderate with AHI 17/hr, uses CPAP nightly(01/15/19 - has Inspire implant)   PVC's (premature ventricular contractions) 07/07/2016   Renal disorder    PT IS SEEING DR. BETSEY- NEPHROLOGIST   Rosacea    Urticaria    Past Surgical History:  Procedure Laterality Date   ANTERIOR CERVICAL DECOMP/DISCECTOMY FUSION N/A 03/13/2023   Procedure: CERVICAL FOUR-FIVE, CERVICAL FIVE-SIX ANTERIOR CERVICAL DISCECTOMY/DECOMPRESSION FUSION;  Surgeon: Onetha Kuba, MD;  Location: [facility];  Service: Neurosurgery;  Laterality: N/A;  3C   CARDIAC CATHETERIZATION  09/06/2013   [facility]   CARDIAC CATHETERIZATION     COLONOSCOPY WITH PROPOFOL  N/A 01/21/2019   Procedure: COLONOSCOPY WITH PROPOFOL ;  Surgeon: Jinny Carmine, MD;  Location: [facility];   Service: Endoscopy;  Laterality: N/A;  sleep apnea   COLONOSCOPY WITH PROPOFOL  N/A 10/19/2021   Procedure: COLONOSCOPY WITH PROPOFOL ;  Surgeon: Jinny Carmine, MD;  Location: [facility];  Service: Endoscopy;  Laterality: N/A;   CORONARY ANGIOPLASTY  2004   STENT PLACEMENT   CYSTOSCOPY WITH RETROGRADE PYELOGRAM, URETEROSCOPY AND STENT PLACEMENT Left 03/19/2014   Procedure: CYSTOSCOPY WITH RETROGRADE PYELOGRAM, URETEROSCOPY AND STENT PLACEMENT;  Surgeon: Mickey Ricardo KATHEE Alvaro, MD;  Location: [facility];  Service: Urology;  Laterality: Left;   CYSTOSCOPY WITH RETROGRADE PYELOGRAM, URETEROSCOPY AND STENT PLACEMENT Bilateral 05/20/2015   Procedure:  CYSTOSCOPY WITH BILATERAL RETROGRADE PYELOGRAM,RIGHT  DIAGNOSTIC URETEROSCOPY ,LEFT URETEROSCOPY WITH HOLMIUM LASER  AND BILATERAL  STENT PLACEMENT ;  Surgeon: Ricardo Alvaro, MD;  Location: [facility];  Service: Urology;  Laterality: Bilateral;   DRUG INDUCED ENDOSCOPY N/A 10/20/2017   Procedure: DRUG INDUCED SLEEP ENDOSCOPY;  Surgeon: Carlie Clark, MD;  Location: [facility];  Service: ENT;  Laterality: N/A;   EP IMPLANTABLE DEVICE N/A 05/13/2016   Procedure: Loop Recorder Insertion;  Surgeon: Elspeth JAYSON Sage, MD;  Location: [facility];  Service: Cardiovascular;  Laterality: N/A;   ESOPHAGOGASTRODUODENOSCOPY N/A 10/19/2021   Procedure: ESOPHAGOGASTRODUODENOSCOPY (EGD);  Surgeon: Jinny Carmine, MD;  Location: [facility];  Service: Endoscopy;  Laterality: N/A;   ESOPHAGOGASTRODUODENOSCOPY (EGD) WITH PROPOFOL  N/A 11/30/2018   Procedure: ESOPHAGOGASTRODUODENOSCOPY (EGD) WITH PROPOFOL ;  Surgeon: Therisa Bi, MD;  Location: [facility];  Service: Gastroenterology;  Laterality: N/A;   ESOPHAGOGASTRODUODENOSCOPY (EGD) WITH PROPOFOL  N/A 12/01/2018   Procedure: ESOPHAGOGASTRODUODENOSCOPY (EGD) WITH PROPOFOL ;  Surgeon: Jinny Carmine, MD;  Location: [facility];  Service: Endoscopy;  Laterality: N/A;   HOLMIUM LASER APPLICATION Bilateral 05/20/2015    Procedure: HOLMIUM LASER APPLICATION;  Surgeon: Ricardo Likens, MD;  Location: [facility];  Service: Urology;  Laterality: Bilateral;   IMPLIMENTATION OF A HYPOGLOSSAL NERVE STIMULATOR  12/08/2017   OSA    LEFT HEART CATHETERIZATION WITH CORONARY ANGIOGRAM N/A 09/06/2013   Procedure: LEFT HEART CATHETERIZATION WITH CORONARY ANGIOGRAM;  Surgeon: Victory LELON Claudene DOUGLAS, MD;  Location: [facility];  Service: Cardiovascular;  Laterality: N/A;   LEFT KNEE CAP  SURGERY ABOUT 40 YRS AGO  ? 1970     STONE EXTRACTION WITH BASKET Left 03/19/2014   Procedure: STONE EXTRACTION WITH BASKET;  Surgeon: Mickey Ricardo KATHEE Likens, MD;  Location: [facility];  Service: Urology;  Laterality: Left;   TRANSURETHRAL RESECTION OF PROSTATE N/A 08/25/2021   Procedure: TRANSURETHRAL RESECTION OF THE PROSTATE (TURP);  Surgeon: Likens Ricardo, MD;  Location: [facility];  Service: Urology;  Laterality: N/A;  1 HR   Patient Active Problem List   Diagnosis Date Noted   Fall 01/03/2024   Hematoma of right hip 01/03/2024   Chronic pulmonary aspiration 12/29/2023   Silent aspiration 12/29/2023   Moderate protein-calorie malnutrition (weight for age 43-74% of standard) 12/29/2023   Bilateral leg weakness 12/29/2023   Chronic respiratory failure with hypoxia, on home oxygen  therapy (HCC) 12/29/2023   Chronic constipation 12/29/2023   Somnolence 12/29/2023   Frequent falls 12/29/2023   Feeding difficulties, unspecified 12/20/2023   Shortness of breath 12/20/2023   Abnormal CT of the chest 10/10/2023   Elevated blood sugar 06/30/2023   Carotid stenosis 05/11/2023   Spinal stenosis in cervical region 03/13/2023   Osteoarthritis of spine with radiculopathy, cervical region 07/14/2022   Cervicogenic headache 11/30/2021   Chronic neck pain 11/30/2021   Weight loss, non-intentional    Dysphagia    Polyp of colon    Prostatic hyperplasia 08/25/2021   Chronic insomnia 03/19/2020   Peripheral neuropathy 03/19/2020   Iron deficiency anemia 01/06/2020    Allergic dermatitis 07/08/2019   Osteoarthritis of left knee 02/26/2019   History of colonic polyps    History of duodenal ulcer    CKD (chronic kidney disease), stage IIIa    Bilateral chronic knee pain 11/09/2018   Epistaxis 10/02/2018   Subjective tinnitus, bilateral 10/02/2018   Chronic pain of right thumb 02/22/2018   Acute bilateral thoracic back pain 01/23/2018   Osteoporosis without current pathological fracture 02/10/2017   Hypothyroidism 11/02/2016   Thoracic compression fracture (HCC) 09/06/2016   PVC's (premature ventricular contractions) 07/07/2016   Hypoalbuminemia 06/28/2016   IgA nephropathy 06/07/2016   Acute buttock pain 05/23/2016   MDD (major depressive disorder), recurrent episode, moderate (HCC) 11/20/2015   OSA (obstructive sleep apnea) 02/05/2014   Orthostatic hypotension 02/07/2013   Scotoma involving central area 01/29/2013   Optic neuropathy 01/28/2013   HEMATURIA UNSPECIFIED 01/21/2010   HLD (hyperlipidemia) 07/05/2006   Generalized anxiety disorder 07/05/2006   CAD (coronary artery disease) 07/05/2006   GERD 07/05/2006    PCP: Dr. Greig Ring    REFERRING PROVIDER: Dr. Arley Helling   REFERRING DIAG: M54.6 (ICD-10-CM) -  Acute bilateral thoracic back pain  THERAPY DIAG:  Pain in thoracic spine  Cervicalgia  Rationale for Evaluation and Treatment: Rehabilitation  ONSET DATE: May 2025    SUBJECTIVE:                                                                                                                                                                                                         SUBJECTIVE STATEMENT:           Pt reports that he has been having a hard time with some health issues over the last 2 weeks. Reports that the doctors feel like weight loss and other health issues are a side effect of swallowing deficits. Reports that they are considering the use of feeding tube for supplemental nutrition.   States that he had some  dizziness when standing up in waiting room with dizziness rated 9/10 for a moment.  And reports that dizziness improves with time in standing, but always present.   Reports back pain 8/10 on this day burning   No major updates since last session. Pt is please to be here for his first visit since transfer of services.    PERTINENT HISTORY:  73yoM referred to physical therapy for ongoing neck pain following cervical fusion in May 2020 and newly developed thoracic pain from T12 compression fracture that occurred sometime in Spring of 2025 (associated radicular pain pattern). Pt has pain localized to upper traps and suboccipitals. Pt attempted DC of prednisone  and gabapentin  two weeks prior to evaluation here, severe decline in function due to pain, barely able to walk for two weeks. He describes feeling increased fatigue and weakness to the point where he struggled to walk. Pt also reported repeated falls in the setting of orthostatic hypotension with him experiencing syncope when he stands up from a seated position. He describes feeling a burning sensation that is also localized to his scapulae and that it occurs when he is doing UE activities like washing his car that is likely the radicular pain from thoracic vertebrae compression fracture. PAIN:  Are you having pain? 8/10 mid thoracic between scapulae  PRECAUTIONS: None  WEIGHT BEARING RESTRICTIONS: No  FALLS:  Has patient fallen in last 6 months? Yes. Number of falls 6, pt reports that falls are happening in the context of syncope when standing up from a seated position (Orthostatic Hypotension) , where he blacks out and falls. He is following up with his PCP, Dr. Karlene who is working with him on stabilizing his blood pressure and she has recently taken him  off Gabapentin  to see if this helps. Pt reports that he does note an improvement in his symptoms with less light headedness since going off medication.   LIVING ENVIRONMENT: Lives with:  lives with their spouse Lives in: House/apartment Stairs: No Has following equipment at home: Single point cane and Environmental consultant - 4 wheeled  OCCUPATION: Retired     PLOF: Independent  PATIENT GOALS: Relief of his neck pain while performing upper extremity activities.    NEXT MD VISIT: October 2025     OBJECTIVE:  Note: Objective measures were completed at evaluation unless otherwise noted.  DIAGNOSTIC FINDINGS:  CLINICAL DATA:  Back pain with occasional radicular symptoms.   EXAM: MRI THORACIC SPINE WITHOUT CONTRAST   TECHNIQUE: Multiplanar, multisequence MR imaging of the thoracic spine was performed. No intravenous contrast was administered.   COMPARISON:  MRI of the thoracic spine dated April 17, 2017.   FINDINGS: Alignment:  Physiologic.   Vertebrae: Chronic compression deformities of T7, T8, T9 and T12. There is retropulsion of the posterosuperior corner of T12 again demonstrated, resulting in mild-to-moderate central spinal canal stenosis, but no spinal cord impingement.   Cord:  Normal in morphology and signal intensity.   Paraspinal and other soft tissues: There are patchy, streaky opacities present in the posterior periphery of the lungs bilaterally the immediate paraspinal soft tissues are unremarkable.   Disc levels:   There is minimal posterior disc bulging present at T7-8 common T9-10 and T10-11, causing minimal central spinal canal stenosis at each level. There is mild-to-moderate central spinal canal stenosis at T12 secondary to retropulsion of the superior posterior corner of the vertebral body. Otherwise, on the spinal canal and neural foramina are widely patent throughout the thoracic region.   IMPRESSION: 1. Chronic compression deformities of T7, T8, T9 and T12, with mild-to-moderate central spinal canal stenosis at T12. There is no adverse interval change. 2. There are patchy, streaky peripheral opacities present posteriorly within the upper  lung zones bilaterally, which could be better assessed with a CT of the thorax.     Electronically Signed   By: Evalene Coho M.D.   On: 10/02/2023 17:14   TREATMENT DATE 01/16/24 :  Sitting: 111/81 HR 70 no s/s  Standing: 0 min  98/81(88) HR 70 moderate s/s    Standing 2 min 106/73(84) HR 75. Mild s/s.  Education on importance of hydration as tolerated.   Cable  low row 2x15 @ 17.5 Repeat shoulder AROM flexion with PVC pipe and trunk extension over chair back 2x15  Therapy ball roll roll out 2x 15 with emphasis and neutral spinal alignment.   Shoulder extension from roughly 90 deg with pushdown bar 12.5# 2 x 10   Edge of chair russian twist with midline med ball 2KG 2x12 alternating   Sit<>stand with min assist for anterior weight shift 2 x 5 with therapeutic rest break between bouts.   Pt reports feeling improved posterior chain activation with reduced weight for shoulder extension and rows.   Ambulated with SPC to SLP at end of PT treatment.    PATIENT EDUCATION:  Education details: Form and technique for correct performance of exercise and explanation about cervical deficits    Person educated: Patient Education method: Medical illustrator Education comprehension: verbalized understanding, returned demonstration, and verbal cues required  HOME EXERCISE PROGRAM: Access Code: [email or web address] URL: [email or web address] Date: 12/27/2023 Prepared by: Toribio Servant  Exercises - Supine Cervical Rotation AROM on Flat Ball  - 1 x daily - 7 x weekly -  2 sets - 10 reps - Seated Cervical Sidebending Stretch  - 1 x daily - 7 x weekly - 3 reps - 30-60 sec  hold - Seated Cervical Sidebending Stretch (Mirrored)  - 1 x daily - 7 x weekly - 3 reps - 30-60 sec  hold - Seated Cervical Flexion Stretch with Finger Support Behind Neck  - 1 x daily - 7 x weekly - 3 reps - 60 sec  hold - Supine Suboccipital Release with Tennis Balls  - 1 x daily - 7 x weekly - 3 reps -  3-5 min hold - Standing Bent Over Single Arm Scapular Row with Table Support  - 3-4 x weekly - 3 sets - 10 reps - Standing Bent Over Single Arm Scapular Row with Table Support (Mirrored)  - 3-4 x weekly - 3 sets - 10 reps - Seated Shoulder Horizontal Abduction with Dumbbells - Thumbs Up  - 3-4 x weekly - 3 sets - 10 reps - Standing Shoulder Single Arm PNF D2 Flexion with Resistance  - 3-4 x weekly - 3 sets - 10 reps - Standing Shoulder Single Arm PNF D2 Flexion with Resistance (Mirrored)  - 3-4 x weekly - 3 sets - 10 reps  ASSESSMENT:  CLINICAL IMPRESSION: Pt does well with his continued activities. Pain stable in session. BP low today, but stable. No reports of increased pain throughout interventions on this day, with improved activation of posterior chain and trunk extension with rowing activities with reduced weight on this day. He will benefit from skilled PT to address these aforementioned deficits to decrease his neck and thoracic spine pain to return to completing UE activities like washing his car and completing yard work without being limited by pain.     OBJECTIVE IMPAIRMENTS: decreased balance, decreased ROM, decreased strength, hypomobility, impaired flexibility, impaired UE functional use, and pain.   ACTIVITY LIMITATIONS: carrying, lifting, standing, bathing, dressing, reach over head, and hygiene/grooming PARTICIPATION LIMITATIONS: cleaning, laundry, shopping, community activity, and yard work PERSONAL FACTORS: Age and 3+ comorbidities: CAD, Depression, are also affecting patient's functional outcome.  REHAB POTENTIAL: Good CLINICAL DECISION MAKING: Stable/uncomplicated EVALUATION COMPLEXITY: Low   GOALS: Goals reviewed with patient? No  SHORT TERM GOALS: Target date: 01/03/2024  Patient will demonstrate undestanding of home exercise plan by performing exercises correctly with evidence of good carry over with min to no verbal or tactile cues .   Baseline: NT 12/25/23:  Performing exercises independently  Goal status: ACHIEVED   2.  Patient will demonstrate understanding of how to modify exercises to decrease thoracic and cervical pain exacerbation for improved function and to decrease incidence of pain flare ups.  Baseline: Does not currently have strategy to manage this  Goal status: ONGOING     LONG TERM GOALS: Target date: 03/13/2024  Patient will show a statistically significant improvement in her neck function as evidence by >=7.5 pt decrease in her neck disability index score to better be able to move neck to improve visual field while driving to avoid accidents. Vernel et al, 2009) Baseline: 33/50 (66%) Goal status: ONGOING    2.  Patient will increase cervical mobility by  >=5 degrees AROM for all planes of motion (flexion, extension, side bending, and rotation) for improved cervical function and evidence of pain reduction. Baseline: Cervical AROM Flex 30, Ext 30, Rot R/L 40/40, SB R/L 35/35    Goal status: ONGOING    3.  Patient will improve shoulder and periscapular strength by 1/3 grade MMT (4- to  4) for improved cervical stability and UE function to carry out upper extremity activities like washing his car and gardening without being limited by pain and discomfort.  Baseline: Shoulder Flex R/L 4/4, Shoulder Abd R/L 4/4    Goal status: ONGOING    4.  Patient will reduce neck and thoracic pain to <=4/10 NRPS while performing UE activities like washing his car and doing yard work as evidence of improved cervical and thoracic function.  Baseline: 10/10 cervical paraspinal and rhomboid pain with UE activity.  Goal status: ONGOING    PLAN:  PT FREQUENCY: 1-2x/week PT DURATION: 12 weeks PLANNED INTERVENTIONS: [id number]- PT Re-evaluation, 97750- Physical Performance Testing, 97110-Therapeutic exercises, 97530- Therapeutic activity, [id number]- Neuromuscular re-education, 97535- Self Care, [id number]- Manual therapy, [id number]- Gait training, [phone]- Canalith  repositioning, [id number]- Aquatic Therapy, [phone]- Electrical stimulation (unattended), [phone]- Electrical stimulation (manual), [id number]- Traction (mechanical), 20560 (1-2 muscles), 20561 (3+ muscles)- Dry Needling, Patient/Family education, Balance training, Joint mobilization, Joint manipulation, Spinal manipulation, Spinal mobilization, Vestibular training, DME instructions, Cryotherapy, and Moist heat  PLAN FOR NEXT SESSION:   Assess orthostatic v/s continue with POC, update HEP as needed.   Massie Dollar PT, DPT  Physical Therapist - [facility]  11:14 AM 01/16/24

## 2024-01-16 NOTE — Therapy (Unsigned)
 OUTPATIENT SPEECH LANGUAGE PATHOLOGY  SWALLOW TREATMENT   Patient Name: Shawn Lam MRN: [id number] DOB:08-12-1950, 73 y.o., male Today's Date: 01/16/2024  PCP: Greig Ring, MD REFERRING PROVIDER: Greig Ring, MD   End of Session - 01/16/24 1139     Visit Number 5    Number of Visits 25    Date for Recertification  03/25/24    Authorization Type Medicare Part A    Progress Note Due on Visit 10    SLP Start Time 1100    SLP Stop Time  1145    SLP Time Calculation (min) 45 min    Activity Tolerance Patient tolerated treatment well          Past Medical History:  Diagnosis Date   Actinic keratosis 02/04/2021   right lateral neck   Anemia    Anxiety    Asymptomatic LV dysfunction    50-55% by echo 06/2016   BCC (basal cell carcinoma of skin) 08/05/2020   left neck infra auricular - nodular pattern - treated with [facility] 09/17/2020   Berger's disease    CAD (coronary artery disease) 06/2005   PCI of LAD and RCA   Depression    Dilated aortic root    4cm at sinus of Valsalva   Hematuria    Hyperlipidemia    Hypothyroidism    Insomnia    Left ureteral stone    Orthostatic hypotension    negative tilt table   OSA (obstructive sleep apnea)    moderate with AHI 17/hr, uses CPAP nightly(01/15/19 - has Inspire implant)   PVC's (premature ventricular contractions) 07/07/2016   Renal disorder    PT IS SEEING DR. BETSEY- NEPHROLOGIST   Rosacea    Urticaria    Past Surgical History:  Procedure Laterality Date   ANTERIOR CERVICAL DECOMP/DISCECTOMY FUSION N/A 03/13/2023   Procedure: CERVICAL FOUR-FIVE, CERVICAL FIVE-SIX ANTERIOR CERVICAL DISCECTOMY/DECOMPRESSION FUSION;  Surgeon: Onetha Kuba, MD;  Location: [facility] OR;  Service: Neurosurgery;  Laterality: N/A;  3C   CARDIAC CATHETERIZATION  09/06/2013   [facility]   CARDIAC CATHETERIZATION     COLONOSCOPY WITH PROPOFOL  N/A 01/21/2019   Procedure: COLONOSCOPY WITH PROPOFOL ;  Surgeon: Jinny Carmine, MD;  Location: [facility];   Service: Endoscopy;  Laterality: N/A;  sleep apnea   COLONOSCOPY WITH PROPOFOL  N/A 10/19/2021   Procedure: COLONOSCOPY WITH PROPOFOL ;  Surgeon: Jinny Carmine, MD;  Location: [facility] ENDOSCOPY;  Service: Endoscopy;  Laterality: N/A;   CORONARY ANGIOPLASTY  2004   STENT PLACEMENT   CYSTOSCOPY WITH RETROGRADE PYELOGRAM, URETEROSCOPY AND STENT PLACEMENT Left 03/19/2014   Procedure: CYSTOSCOPY WITH RETROGRADE PYELOGRAM, URETEROSCOPY AND STENT PLACEMENT;  Surgeon: Mickey Ricardo KATHEE Alvaro, MD;  Location: [facility] ORS;  Service: Urology;  Laterality: Left;   CYSTOSCOPY WITH RETROGRADE PYELOGRAM, URETEROSCOPY AND STENT PLACEMENT Bilateral 05/20/2015   Procedure:  CYSTOSCOPY WITH BILATERAL RETROGRADE PYELOGRAM,RIGHT  DIAGNOSTIC URETEROSCOPY ,LEFT URETEROSCOPY WITH HOLMIUM LASER  AND BILATERAL  STENT PLACEMENT ;  Surgeon: Ricardo Alvaro, MD;  Location: [facility];  Service: Urology;  Laterality: Bilateral;   DRUG INDUCED ENDOSCOPY N/A 10/20/2017   Procedure: DRUG INDUCED SLEEP ENDOSCOPY;  Surgeon: Carlie Clark, MD;  Location: [facility];  Service: ENT;  Laterality: N/A;   EP IMPLANTABLE DEVICE N/A 05/13/2016   Procedure: Loop Recorder Insertion;  Surgeon: Elspeth JAYSON Sage, MD;  Location: [facility];  Service: Cardiovascular;  Laterality: N/A;   ESOPHAGOGASTRODUODENOSCOPY N/A 10/19/2021   Procedure: ESOPHAGOGASTRODUODENOSCOPY (EGD);  Surgeon: Jinny Carmine, MD;  Location: [facility] ENDOSCOPY;  Service: Endoscopy;  Laterality: N/A;   ESOPHAGOGASTRODUODENOSCOPY (EGD) WITH PROPOFOL  N/A 11/30/2018   Procedure: ESOPHAGOGASTRODUODENOSCOPY (EGD) WITH PROPOFOL ;  Surgeon: Therisa Bi, MD;  Location: [facility] ENDOSCOPY;  Service: Gastroenterology;  Laterality: N/A;   ESOPHAGOGASTRODUODENOSCOPY (EGD) WITH PROPOFOL  N/A 12/01/2018   Procedure: ESOPHAGOGASTRODUODENOSCOPY (EGD) WITH PROPOFOL ;  Surgeon: Jinny Carmine, MD;  Location: [facility] ENDOSCOPY;  Service: Endoscopy;  Laterality: N/A;   HOLMIUM LASER APPLICATION Bilateral 05/20/2015    Procedure: HOLMIUM LASER APPLICATION;  Surgeon: Ricardo Likens, MD;  Location: [facility];  Service: Urology;  Laterality: Bilateral;   IMPLIMENTATION OF A HYPOGLOSSAL NERVE STIMULATOR  12/08/2017   OSA    LEFT HEART CATHETERIZATION WITH CORONARY ANGIOGRAM N/A 09/06/2013   Procedure: LEFT HEART CATHETERIZATION WITH CORONARY ANGIOGRAM;  Surgeon: Victory LELON Claudene DOUGLAS, MD;  Location: [facility] CATH LAB;  Service: Cardiovascular;  Laterality: N/A;   LEFT KNEE CAP  SURGERY ABOUT 40 YRS AGO  ? 1970     STONE EXTRACTION WITH BASKET Left 03/19/2014   Procedure: STONE EXTRACTION WITH BASKET;  Surgeon: Mickey Ricardo KATHEE Likens, MD;  Location: [facility];  Service: Urology;  Laterality: Left;   TRANSURETHRAL RESECTION OF PROSTATE N/A 08/25/2021   Procedure: TRANSURETHRAL RESECTION OF THE PROSTATE (TURP);  Surgeon: Likens Ricardo, MD;  Location: [facility];  Service: Urology;  Laterality: N/A;  1 HR   Patient Active Problem List   Diagnosis Date Noted   Fall 01/03/2024   Hematoma of right hip 01/03/2024   Chronic pulmonary aspiration 12/29/2023   Silent aspiration 12/29/2023   Moderate protein-calorie malnutrition (weight for age 24-74% of standard) 12/29/2023   Bilateral leg weakness 12/29/2023   Chronic respiratory failure with hypoxia, on home oxygen  therapy (HCC) 12/29/2023   Chronic constipation 12/29/2023   Somnolence 12/29/2023   Frequent falls 12/29/2023   Feeding difficulties, unspecified 12/20/2023   Shortness of breath 12/20/2023   Abnormal CT of the chest 10/10/2023   Elevated blood sugar 06/30/2023   Carotid stenosis 05/11/2023   Spinal stenosis in cervical region 03/13/2023   Osteoarthritis of spine with radiculopathy, cervical region 07/14/2022   Cervicogenic headache 11/30/2021   Chronic neck pain 11/30/2021   Weight loss, non-intentional    Dysphagia    Polyp of colon    Prostatic hyperplasia 08/25/2021   Chronic insomnia 03/19/2020   Peripheral neuropathy 03/19/2020   Iron deficiency anemia 01/06/2020    Allergic dermatitis 07/08/2019   Osteoarthritis of left knee 02/26/2019   History of colonic polyps    History of duodenal ulcer    CKD (chronic kidney disease), stage IIIa    Bilateral chronic knee pain 11/09/2018   Epistaxis 10/02/2018   Subjective tinnitus, bilateral 10/02/2018   Chronic pain of right thumb 02/22/2018   Acute bilateral thoracic back pain 01/23/2018   Osteoporosis without current pathological fracture 02/10/2017   Hypothyroidism 11/02/2016   Thoracic compression fracture (HCC) 09/06/2016   PVC's (premature ventricular contractions) 07/07/2016   Hypoalbuminemia 06/28/2016   IgA nephropathy 06/07/2016   Acute buttock pain 05/23/2016   MDD (major depressive disorder), recurrent episode, moderate (HCC) 11/20/2015   OSA (obstructive sleep apnea) 02/05/2014   Orthostatic hypotension 02/07/2013   Scotoma involving central area 01/29/2013   Optic neuropathy 01/28/2013   HEMATURIA UNSPECIFIED 01/21/2010   HLD (hyperlipidemia) 07/05/2006   Generalized anxiety disorder 07/05/2006   CAD (coronary artery disease) 07/05/2006   GERD 07/05/2006    ONSET DATE: 12/*2024 following ACDF; Date of referral 12/28/2023  REFERRING DIAG:  T17.908D (ICD-10-CM) - Chronic pulmonary aspiration, subsequent encounter  T17.900D (ICD-10-CM) - Silent aspiration, subsequent encounter  E44.0 (ICD-10-CM) - Moderate protein-calorie malnutrition  R13.10 (ICD-10-CM) - Dysphagia, unspecified type    THERAPY DIAG:  Oropharyngeal dysphagia  Rationale for Evaluation and Treatment Rehabilitation  SUBJECTIVE:   PERTINENT HISTORY and DIAGNOSTIC FINDINGS:  Pt is a 73 yo male w/ ACDF surgery 03/2023.  PMH: GERD(Not on a PPI per chart), Actinic keratosis (02/04/2021), Anemia, Anxiety, Asymptomatic LV dysfunction, BCC (basal cell carcinoma of skin) (08/05/2020), Berger's disease, CAD (coronary artery disease) (06/2005), resting tremor, Depression, Dilated aortic root (HCC), Hematuria, Hyperlipidemia,  Hypothyroidism, Insomnia, Left ureteral stone, Orthostatic hypotension, OSA (obstructive sleep apnea), PVC's (premature ventricular contractions) (07/07/2016), Renal disorder, Rosacea, and Urticaria.  Pt is followed by Cardiology for Coronary Artery Stenosis currently; also followed for Migraines.     EGD in 2023 revealed Gastritis.      CT of Chest 10/2023: 1. Interim development of patchy consolidation and ground-glass   disease most evident at the right middle lobe and right lower lobes   with additional areas of mild ground-glass disease and scattered   clustered nodularity, findings are suspicious for multifocal   infection/pneumonia. Imaging follow-up to resolution is recommended.   2. Emphysema.   3. Aortic atherosclerosis.            Dysphagia - reported by pt as Chronic, ever since neck surgery in December 2024. Pt was seen in the emergency room s/p Fall, notes detailing that he slipped on soap in the shower, fell backwards on right side hit head and neck against shower seat, possible brief loss of consciousness, per chart.  ACDF 03/2023 per chart.  Since the neck surgery in 03/2023, he has noted food, liquids and pill getting stuck on the right side of my throat more.  I used to cough a lot more but not as much now.  I cough up what gets stuck..  He has lost significant weight per chart notes due to decreased eating.  Pt endorsed using his home O2 when he feels SOB.   MBSS 12/26/2023 Patient appears to present w/ Chronic, moderate-severe Pharyngeal phase dysphagia per this assessment today. It appeared there was some impact from changes in the Cervical Esophagus s/p ACDF surgery(03/2023 per chart notes) including structural/tissue changes in/aournd the CP segment and cervical esophagus. Pt with intermittent silent aspiration of thin liquids.   Recommend continue current diet of easy to eat foods at home; thin liquids via cup- at this time. Oral care Before any oral intake. Swallow  Strategies Recommended: No straws, moistened foods, SMALL bites/sips, Head Turn RIGHT w/ slight chin tuck, multiple swallows to aid clearing, AND strong Cough/throat clear b/t bites/sips and at end of meal.  D/t the Chronicity of pt's Pharyngeal phase Dysphagia w/ weight loss and pulmonary impact per recent Chest CT, pt and Family may need to discuss alternative means of nutrition/hydration as support w/ his Medical Team/PCP, while seeking swallow therapy.  PAIN:  Are you having pain? No  FALLS: Has patient fallen in last 6 months?  See PT evaluation for details  LIVING ENVIRONMENT: Lives with: lives with their spouse Lives in: House/apartment  PLOF:  Level of assistance: Independent with ADLs, Independent with IADLs Employment: Retired   PATIENT GOALS  to improve swallow function, nutrition and hydration, decrease aspiration risks  SUBJECTIVE STATEMENT: Pt eager, wife attended the session Pt accompanied by: self, wife   OBJECTIVE:   TODAY'S TREATMENT:  Skilled treatment session focused on pt's dysphagia goals. SLP facilitated the session by providing the  following interventions:  Skilled observation was provided on pt consuming thin liquids via cup while using right head turn with chin tuck. Pt incorrectly performed technique - tilting his body instead of his head. With max A, pt correctly completed swallow with right head turn and chin tuck. Of note, overt s/s concerning for aspiration c/b multiple swallows immediate throat clearing and surpressed cough suspect delayed swallow initiation and reduced airway protection.    Masako - pt able to attempt Masako correctly - much improved, pt able to complete swallow 8 times for 1 set    Chin Tuck Against Resistance (CTAR) - much improved, pt reports able to complete 1 set of 8 repetitions of CTAR correctly    Jaw opening exercise - pt able to complete 2 sets of 5 repetitions, holding for 10 seconds each   PATIENT EDUCATION: Education  details: ST POC, repeat MBSS in 8 to 12 weeks Person educated: Patient and Spouse Education method: Explanation and Handouts Education comprehension: needs further education  HOME EXERCISE PROGRAM:     Implement aspiration strategies, compensatory swallow strateges   GOALS: Goals reviewed with patient? Yes  SHORT TERM GOALS: Target date: 10 sessions  With Mod I, pt will perform compensatory swallow strategies (chin tuck, multiple swallows, head turn) to eliminate s/s of aspiration when consuming least restrictive diet. Baseline: Goal status: INITIAL  2.  With Min A, pt will complete (supraglottic, swallow, Mednelson maneuver, effortful swallow) to improve oral motor weakness, tongue base retraction, hyolarnygeal excursion, airway protection and/or clearance of the bolus through the pharynx. Baseline:  Goal status: INITIAL  3. Pt will demonstrate overt s/s of aspiration when consuming ice chips.   Baseline:  Goal status: INITIAL   LONG TERM GOALS: Target date: 03/25/2024  With Mod I, pt will demonstrate the ability to adequately self-monitor swallowing skills and perform appropriate compensatory techniques to reduce s/s of aspiration and to safely consume least restrictive diet.     Baseline:  Goal status: INITIAL  2.  Pt will participate in repeat instrumental swallow evaluation to assess least restrictive diet.  Baseline:  Goal status: INITIAL   ASSESSMENT:  CLINICAL IMPRESSION: Patient is a 73 y.o. male who was seen today for a clinical swallow evaluation d/t severe pharyngeal phase dysphagia following ACDF (03/2023).    See the above treatment note for details.   OBJECTIVE IMPAIRMENTS include dysphagia. These impairments are limiting patient from safety when swallowing. Factors affecting potential to achieve goals and functional outcome are severity of impairments and malnutrition. Patient will benefit from skilled SLP services to address above impairments and improve  overall function.  REHAB POTENTIAL: Good  PLAN: SLP FREQUENCY: 1-2x/week  SLP DURATION: 12 weeks  PLANNED INTERVENTIONS: Aspiration precaution training, Pharyngeal strengthening exercises, Diet toleration management , Oral motor exercises, SLP instruction and feedback, Compensatory strategies, and Patient/family education   Happi B. Rubbie, M.S., CCC-SLP, Tree surgeon Certified Brain Injury Specialist [facility]  [facility] Rehabilitation Services Office [phone] Ascom [phone] Fax [phone]

## 2024-01-16 NOTE — Telephone Encounter (Signed)
 Called patient to discuss scheduling of gastrostomy tube as well as referral to home health with [facility].

## 2024-01-17 ENCOUNTER — Encounter: Admitting: Physical Therapy

## 2024-01-17 ENCOUNTER — Telehealth: Payer: Self-pay | Admitting: Family Medicine

## 2024-01-17 NOTE — Telephone Encounter (Signed)
 Will update the requesting office notes have been sent to Dr. Gollan for recommendations and input. Once we have the clearance, our team will be sure to fax clearance notes.

## 2024-01-17 NOTE — Progress Notes (Signed)
 Patient for IR G-Tube Insertion on Thurs 01/18/24, I called and spoke with the patient on the phone and gave pre-procedure instructions. Pt was made aware to be here at 1p, last dose of Plavix  and ASA 81mg  was 01/13/24, NPO after MN prior to procedure as well as driver post procedure/recovery/discharge. Pt stated understanding. Called 01/17/24

## 2024-01-17 NOTE — H&P (Signed)
 Chief Complaint: Patient was seen in consultation today for dysphagia secondary to aspiration as a complication of recent ACDF, with consideration for G-tube placement.  Referring Provider(s): Dr. Greig Ring, MD  Supervising Physician: Jenna Hacker  Patient Status: [facility] - Out-pt  Patient is Full Code  History of Present Illness: Shawn Lam is a 73 y.o. male  with PMHx notable for dysphagia, HTN, HLD, CAD status post PCI of LAD and RCA, Buerger's disease, asymptomatic LV dysfunction, anemia, dilated aortic root, OSA, renal disorder, nephrolithiasis, anemia, anxiety, and others as delineated below.  Per Dr. Sherrel progress note dated 9/19:  Ever since ACDF surgery in 03/2023 he has noted food, liquids and pill getting stuck on right throat. Choking and coughing to dislodged things. He has lost a significant amount of weight due to decreased eating given swallowing problems  He does not each much each day per wife. Pt states appetite is good.   Recent swallowing study: Discussion with  Comer Portugal, SLP  Silent  aspiration seen.. Issues swallowing ever since ACDF.  He has had significant weight loss.  Concern for  likely chronic aspiration on recent CT chest.  ( 1. Interim development of patchy consolidation and ground-glass disease most evident at the right middle lobe and right lower lobes with additional areas of mild ground-glass disease and scattered clustered nodularity, findings are suspicious for multifocal infection/pneumonia. Saw Dr. Lenda on 12/2023 for SOB, plan repeat CT, breathing tests and possible ECHO.   Pt plans to do Speech therapy at [facility] for  dysphagia... order signed. Recommended nutrition referral  for more nutritive  swallowing and ENT eval to eval site of ACDF. Consider possible eval of alternative means of feeding.  Patient was recommended for G-tube placement by care team.  Patient and family are in agreement.   Interventional  Radiology was requested for gastrostomy tube placement. Request was reviewed and approved by Dr. Philip. Patient is scheduled for same in IR today.   Patient is alert and laying in bed, calm. Wife, son, and G-tube representative all at bedside. Patient is currently without any significant complaints. He does note a sore throat from taking a pill earlier in the day. Patient denies any fevers, headache, chest pain, SOB, cough, abdominal pain, nausea, vomiting or bleeding.    Past Medical History:  Diagnosis Date   Actinic keratosis 02/04/2021   right lateral neck   Anemia    Anxiety    Asymptomatic LV dysfunction    50-55% by echo 06/2016   BCC (basal cell carcinoma of skin) 08/05/2020   left neck infra auricular - nodular pattern - treated with [facility] 09/17/2020   Berger's disease    CAD (coronary artery disease) 06/2005   PCI of LAD and RCA   Depression    Dilated aortic root    4cm at sinus of Valsalva   Hematuria    Hyperlipidemia    Hypothyroidism    Insomnia    Left ureteral stone    Orthostatic hypotension    negative tilt table   OSA (obstructive sleep apnea)    moderate with AHI 17/hr, uses CPAP nightly(01/15/19 - has Inspire implant)   PVC's (premature ventricular contractions) 07/07/2016   Renal disorder    PT IS SEEING DR. BETSEY- NEPHROLOGIST   Rosacea    Urticaria     Past Surgical History:  Procedure Laterality Date   ANTERIOR CERVICAL DECOMP/DISCECTOMY FUSION N/A 03/13/2023   Procedure: CERVICAL FOUR-FIVE, CERVICAL FIVE-SIX ANTERIOR CERVICAL DISCECTOMY/DECOMPRESSION FUSION;  Surgeon: Onetha Kuba, MD;  Location[facility];  Service: Neurosurgery;  Laterality: N/A;  3C   CARDIAC CATHETERIZATION  09/06/2013   [facility]   CARDIAC CATHETERIZATION     COLONOSCOPY WITH PROPOFOL  N/A 01/21/2019   Procedure: COLONOSCOPY WITH PROPOFOL ;  Surgeon: Jinny Carmine, MD;  Location: [facility];  Service: Endoscopy;  Laterality: N/A;  sleep apnea   COLONOSCOPY WITH PROPOFOL  N/A  10/19/2021   Procedure: COLONOSCOPY WITH PROPOFOL ;  Surgeon: Jinny Carmine, MD;  Location: [facility] ENDOSCOPY;  Service: Endoscopy;  Laterality: N/A;   CORONARY ANGIOPLASTY  2004   STENT PLACEMENT   CYSTOSCOPY WITH RETROGRADE PYELOGRAM, URETEROSCOPY AND STENT PLACEMENT Left 03/19/2014   Procedure: CYSTOSCOPY WITH RETROGRADE PYELOGRAM, URETEROSCOPY AND STENT PLACEMENT;  Surgeon: Mickey Ricardo KATHEE Alvaro, MD;  Location: [facility] ORS;  Service: Urology;  Laterality: Left;   CYSTOSCOPY WITH RETROGRADE PYELOGRAM, URETEROSCOPY AND STENT PLACEMENT Bilateral 05/20/2015   Procedure:  CYSTOSCOPY WITH BILATERAL RETROGRADE PYELOGRAM,RIGHT  DIAGNOSTIC URETEROSCOPY ,LEFT URETEROSCOPY WITH HOLMIUM LASER  AND BILATERAL  STENT PLACEMENT ;  Surgeon: Ricardo Alvaro, MD;  Location: [facility];  Service: Urology;  Laterality: Bilateral;   DRUG INDUCED ENDOSCOPY N/A 10/20/2017   Procedure: DRUG INDUCED SLEEP ENDOSCOPY;  Surgeon: Carlie Clark, MD;  Location: [facility];  Service: ENT;  Laterality: N/A;   EP IMPLANTABLE DEVICE N/A 05/13/2016   Procedure: Loop Recorder Insertion;  Surgeon: Elspeth JAYSON Sage, MD;  Location: [facility] INVASIVE CV LAB;  Service: Cardiovascular;  Laterality: N/A;   ESOPHAGOGASTRODUODENOSCOPY N/A 10/19/2021   Procedure: ESOPHAGOGASTRODUODENOSCOPY (EGD);  Surgeon: Jinny Carmine, MD;  Location: [facility] ENDOSCOPY;  Service: Endoscopy;  Laterality: N/A;   ESOPHAGOGASTRODUODENOSCOPY (EGD) WITH PROPOFOL  N/A 11/30/2018   Procedure: ESOPHAGOGASTRODUODENOSCOPY (EGD) WITH PROPOFOL ;  Surgeon: Therisa Bi, MD;  Location: [facility] ENDOSCOPY;  Service: Gastroenterology;  Laterality: N/A;   ESOPHAGOGASTRODUODENOSCOPY (EGD) WITH PROPOFOL  N/A 12/01/2018   Procedure: ESOPHAGOGASTRODUODENOSCOPY (EGD) WITH PROPOFOL ;  Surgeon: Jinny Carmine, MD;  Location: [facility] ENDOSCOPY;  Service: Endoscopy;  Laterality: N/A;   HOLMIUM LASER APPLICATION Bilateral 05/20/2015   Procedure: HOLMIUM LASER APPLICATION;  Surgeon: Ricardo Alvaro, MD;  Location: [facility];   Service: Urology;  Laterality: Bilateral;   IMPLIMENTATION OF A HYPOGLOSSAL NERVE STIMULATOR  12/08/2017   OSA    LEFT HEART CATHETERIZATION WITH CORONARY ANGIOGRAM N/A 09/06/2013   Procedure: LEFT HEART CATHETERIZATION WITH CORONARY ANGIOGRAM;  Surgeon: Victory LELON Claudene DOUGLAS, MD;  Location: [facility] CATH LAB;  Service: Cardiovascular;  Laterality: N/A;   LEFT KNEE CAP  SURGERY ABOUT 40 YRS AGO  ? 1970     STONE EXTRACTION WITH BASKET Left 03/19/2014   Procedure: STONE EXTRACTION WITH BASKET;  Surgeon: Mickey Ricardo KATHEE Alvaro, MD;  Location: [facility] ORS;  Service: Urology;  Laterality: Left;   TRANSURETHRAL RESECTION OF PROSTATE N/A 08/25/2021   Procedure: TRANSURETHRAL RESECTION OF THE PROSTATE (TURP);  Surgeon: Alvaro Ricardo, MD;  Location: [facility];  Service: Urology;  Laterality: N/A;  1 HR    Allergies: Codeine and Tape  Medications: Prior to Admission medications   Medication Sig Start Date End Date Taking? Authorizing Provider  aspirin  EC 81 MG tablet Take 81 mg by mouth daily. 10/02/18   [provider]  atorvastatin  (LIPITOR ) 80 MG tablet TAKE 1 TABLET BY MOUTH DAILY 08/21/23   Bedsole, Amy E, MD  baclofen (LIORESAL) 10 MG tablet Take 10 mg by mouth 2 (two) times daily. 09/19/23   [provider]  buPROPion  (WELLBUTRIN  XL) 150 MG 24 hr tablet TAKE 3 TABLETS BY MOUTH DAILY  01/08/24   Bedsole, Amy E, MD  busPIRone  (BUSPAR ) 10 MG tablet TAKE 2 TABLETS BY MOUTH 3 TIMES A DAY 11/06/23   Bedsole, Amy E, MD  Cholecalciferol  (VITAMIN D3) 50 MCG (2000 UT) TABS Take 2,000 Units by mouth daily.    [provider]  clopidogrel  (PLAVIX ) 75 MG tablet TAKE 1 TABLET BY MOUTH DAILY 06/05/23   Gerard Frederick, NP  cyanocobalamin  (VITAMIN B12) 1000 MCG tablet Take 1,000 mcg by mouth daily. 10/02/18   [provider]  denosumab  (PROLIA ) 60 MG/ML SOSY injection Inject 60 mg into the skin every 6 (six) months. Do not send RX, order this from the office    [provider]  DULoxetine   (CYMBALTA ) 30 MG capsule TAKE THREE CAPSULES BY MOUTH DAILY 10/11/23   Bedsole, Amy E, MD  Erenumab-aooe (AIMOVIG) 140 MG/ML SOAJ 140 mg every 28 (twenty-eight) days.    [provider]  finasteride  (PROSCAR ) 5 MG tablet Take 5 mg by mouth daily. 01/22/21   [provider]  levothyroxine  (SYNTHROID ) 88 MCG tablet TAKE 1 TABLET BY MOUTH DAILY BEFORE BREAKFAST 08/28/23   Bedsole, Amy E, MD  losartan  (COZAAR ) 50 MG tablet Take 1 tablet (50 mg total) by mouth daily. 12/25/23   Gollan, Timothy J, MD  Magnesium  250 MG CAPS Take 250 mg by mouth daily.    [provider]  metoCLOPramide (REGLAN) 10 MG tablet Take 10 mg by mouth every 8 (eight) hours as needed. 12/13/23   [provider]  metoprolol  succinate (TOPROL  XL) 25 MG 24 hr tablet Take 1 tablet (25 mg total) by mouth daily. 08/09/23   Gerard Frederick, NP  nitroGLYCERIN  (NITROSTAT ) 0.4 MG SL tablet Place 1 tablet (0.4 mg total) under the tongue every 5 (five) minutes as needed for chest pain. 07/04/23   Bedsole, Amy E, MD  triamcinolone  cream (KENALOG ) 0.1 % APPLY 2 TOPICALLY DAILY , DO NOT APPLY ON DACE , GROIN AND UNDERARMS. 12/29/23   Bedsole, Amy E, MD  zolpidem  (AMBIEN ) 10 MG tablet Take 1 tablet (10 mg total) by mouth at bedtime as needed for sleep. 01/16/24 02/15/24  Bedsole, Greig BRAVO, MD  zonisamide  (ZONEGRAN ) 25 MG capsule Take 1 capsule (25 mg total) by mouth daily. 12/15/23   Avelina Greig BRAVO, MD     Family History  Problem Relation Age of Onset   Coronary artery disease Mother    Heart attack Mother    Heart disease Mother    Hypertension Mother    Cancer Father        lung and colon   Lung cancer Father        smoker   Diabetes Brother     Social History   Socioeconomic History   Marital status: Married    Spouse name: Not on file   Number of children: Not on file   Years of education: Not on file   Highest education level: Some college, no degree  Occupational History   Occupation: retired Health and safety inspector   Tobacco Use   Smoking status: Never   Smokeless tobacco: Never  Vaping Use   Vaping status: Never Used  Substance and Sexual Activity   Alcohol use: Not Currently    Alcohol/week: 1.0 standard drink of alcohol    Types: 1 Cans of beer per week   Drug use: No   Sexual activity: Yes    Partners: Female    Birth control/protection: None  Other Topics Concern   Not on file  Social  History Narrative   Regular exercise--yes--jog 5 miles 6 days a week   Social Drivers of Corporate investment banker Strain: Low Risk  (10/06/2023)   Overall Financial Resource Strain (CARDIA)    Difficulty of Paying Living Expenses: Not hard at all  Food Insecurity: No Food Insecurity (10/06/2023)   Hunger Vital Sign    Worried About Running Out of Food in the Last Year: Never true    Ran Out of Food in the Last Year: Never true  Transportation Needs: No Transportation Needs (10/06/2023)   PRAPARE - Administrator, Civil Service (Medical): No    Lack of Transportation (Non-Medical): No  Physical Activity: Sufficiently Active (10/06/2023)   Exercise Vital Sign    Days of Exercise per Week: 7 days    Minutes of Exercise per Session: 60 min  Stress: Stress Concern Present (10/06/2023)   [employer] of Occupational Health - Occupational Stress Questionnaire    Feeling of Stress: Very much  Social Connections: Moderately Isolated (10/06/2023)   Social Connection and Isolation Panel    Frequency of Communication with Friends and Family: More than three times a week    Frequency of Social Gatherings with Friends and Family: Three times a week    Attends Religious Services: Never    Active Member of Clubs or Organizations: No    Attends Engineer, structural: Not on file    Marital Status: Married     Review of Systems: A 12 point ROS discussed and pertinent positives are indicated in the HPI above.  All other systems are negative.  Vital Signs: BP (!) 156/91   Pulse 63   Temp  97.6 F (36.4 C) (Temporal)   Resp 10   Ht 6' (1.829 m)   Wt 115 lb (52.2 kg)   SpO2 98%   BMI 15.60 kg/m   Advance Care Plan: The advanced care place/surrogate decision maker was discussed at the time of visit and the patient did not wish to discuss or was not able to name a surrogate decision maker or provide an advance care plan.  Physical Exam Vitals reviewed.  Constitutional:      General: He is not in acute distress.    Appearance: Normal appearance.     Comments: Patient is underweight.  HENT:     Mouth/Throat:     Mouth: Mucous membranes are dry.  Cardiovascular:     Rate and Rhythm: Normal rate and regular rhythm.     Pulses: Normal pulses.     Heart sounds: Normal heart sounds.  Pulmonary:     Effort: Pulmonary effort is normal.     Breath sounds: Normal breath sounds.  Abdominal:     General: Abdomen is flat. There is no distension.     Palpations: Abdomen is soft.     Tenderness: There is no abdominal tenderness.  Musculoskeletal:        General: Normal range of motion.     Cervical back: Normal range of motion.  Skin:    General: Skin is warm and dry.  Neurological:     Mental Status: He is alert and oriented to person, place, and time.  Psychiatric:        Mood and Affect: Mood normal.        Behavior: Behavior normal.        Thought Content: Thought content normal.        Judgment: Judgment normal.     Imaging: CT ABDOMEN WO  CONTRAST Result Date: 01/11/2024 CLINICAL DATA:  Unintentional weight loss, malnutrition, dysphagia. EXAM: CT ABDOMEN WITHOUT CONTRAST TECHNIQUE: Multidetector CT imaging of the abdomen was performed following the standard protocol without IV contrast. RADIATION DOSE REDUCTION: This exam was performed according to the departmental dose-optimization program which includes automated exposure control, adjustment of the mA and/or kV according to patient size and/or use of iterative reconstruction technique. COMPARISON:  04/25/2022  FINDINGS: Lower chest: Patchy scarring/atelectasis at both lung bases. No overt consolidation or pleural fluid. Hepatobiliary: No focal liver abnormality is seen. No gallstones, gallbladder wall thickening, or biliary dilatation. Pancreas: Unremarkable. No pancreatic ductal dilatation or surrounding inflammatory changes. Spleen: Normal in size without focal abnormality. Adrenals/Urinary Tract: No adrenal masses. Both kidneys demonstrate punctate nonobstructing calculi without hydronephrosis or lesion. Stomach/Bowel: Moderate fecal material in the visualized colon. There is some fluid in the stomach. No small bowel dilatation. No free intraperitoneal air. Vascular/Lymphatic: No significant vascular findings are present. No enlarged abdominal lymph nodes. Other: No abdominal wall hernia, ascites or focal fluid collections. Musculoskeletal: Stable compression deformity at T12 with prior vertebral augmentation. Stable minimal loss of height involving the superior endplate of L2. IMPRESSION: 1. No acute findings in the abdomen. 2. Punctate nonobstructing bilateral renal calculi. 3. Stable compression deformity at T12 with prior vertebral augmentation. Stable minimal loss of height involving the superior endplate of L2. Electronically Signed   By: Marcey Moan M.D.   On: 01/11/2024 11:50   DG SWALLOW FUNC OP MEDICARE SPEECH PATH Result Date: 12/26/2023 CLINICAL DATA:  73 year old male with history of C4-C6 ACDF (03/2023) and Inspire device implantation with globus sensation and dysphagia to solids (especially pills). EXAM: MODIFIED BARIUM SWALLOW TECHNIQUE: Different consistencies of barium were administered orally to the patient by the Speech Pathologist. Imaging of the pharynx was performed in the lateral projection. Carlin Griffon, PA-C was present in the fluoroscopy room during this study, which was supervised and interpreted by Dr. Manford Cummins. Radiologist was not present in the fluoroscopy room. FLUOROSCOPY:  Radiation Exposure Index (as provided by the fluoroscopic device): 5.60 mGy Kerma COMPARISON:  DG cervical spine one view on 03/13/2023; MR cervical spine without contrast on 08/22/2022; CT cervical spine without contrast on 04/25/2022. FINDINGS: Vestibular Penetration: Noted with all consistencies from overflow of residue in the distal pharynx in the vallecula and pyriform sinuses. Aspiration: Silent aspiration seen inconsistently with thin liquids. Other: ACDF fixation hardware noted at C4 through C6. Inspire device wire noted projecting over the trachea. The visualized upper esophagus appears uniformly distensible. IMPRESSION: Findings of overflow penetration and silent aspiration of thin liquids. Please refer to the Speech Pathologists report for complete details and recommendations. Electronically Signed   By: Limin  Xu M.D.   On: 12/26/2023 16:33    Labs:  CBC: Recent Labs    03/08/23 1016 03/21/23 0740 08/15/23 0815 01/18/24 1247  WBC 7.6 7.6 5.5 6.3  HGB 12.7* 14.0 12.4* 12.4*  HCT 40.1 41.9 37.9* 39.7  PLT 201 278 235.0 213    COAGS: Recent Labs    01/18/24 1247  INR 1.0    BMP: Recent Labs    03/08/23 1016 03/21/23 0740 03/31/23 0728 06/22/23 1129 06/27/23 1425 07/12/23 0804 12/28/23 0811  NA 142 137   < > 142 145* 139 139  K 4.3 3.9   < > 4.2 4.5 4.4 4.9  CL 108 99   < > 105 110* 101 106  CO2 26 26   < > 28 22 29 27   GLUCOSE 92  109*   < > 135* 145* 90 90  BUN 22 19   < > 15 13 18  27*  CALCIUM  9.8 9.2   < > 9.3 8.7 8.7 9.3  CREATININE 1.51* 1.14   < > 1.28 1.10 1.15 1.44  GFRNONAA 49* >60  --   --   --   --   --    < > = values in this interval not displayed.    LIVER FUNCTION TESTS: Recent Labs    03/31/23 0728 07/12/23 0804 08/15/23 0815  BILITOT 0.5 0.6 0.6  AST 39* 50* 47*  ALT 43 54* 43  ALKPHOS 69 67 55  PROT 6.0 5.9* 5.9*  ALBUMIN 4.1 3.9 3.9    TUMOR MARKERS: No results for input(s): AFPTM, CEA, CA199, CHROMGRNA in the last 8760  hours.  Assessment and Plan: Per Dr. Sherrel progress note dated 9/19:  Ever since ACDF surgery in 03/2023 he has noted food, liquids and pill getting stuck on right throat. Choking and coughing to dislodged things. He has lost a significant amount of weight due to decreased eating given swallowing problems  He does not each much each day per wife. Pt states appetite is good.   [...]  Recommended nutrition referral  for more nutritive  swallowing and ENT eval to eval site of ACDF.  Consider possible eval of alternative means of feeding.  Patient was recommended for G-tube placement.  Patient is in agreement. He presents for scheduled gastrostomy tube placement in IR today.  Patient has been NPO since midnight.  All labs and medications are within acceptable parameters. Plavix  and Aspirin  have been discontinued for 1 week, per patient report. Allergies reviewed: Tape; Codeine (N&V).  Risks and benefits image guided gastrostomy tube placement was discussed with the patient including, but not limited to the need for a barium enema during the procedure, bleeding, infection, peritonitis and/or damage to adjacent structures.  All of the patient's questions were answered, patient is agreeable to proceed.  Consent signed and in chart.    Thank you for allowing our service to participate in Shawn Lam 's care.  Electronically Signed: Carlin DELENA Griffon, PA-C   01/18/2024, 2:16 PM      I spent a total of 30 Minutes in face to face in clinical consultation, greater than 50% of which was counseling/coordinating care for dysphagia secondary to aspiration as a complication of recent ACDF, with consideration for G-tube placement.

## 2024-01-17 NOTE — Telephone Encounter (Signed)
 Copied from CRM #[id number]. Topic: Referral - Status >> Jan 17, 2024  2:34 PM Dedra B wrote: Reason for CRM: Joesph from [facility] [employer] called regarding pt's tube feed referral. They have not received an order being placed tomorrow. Pls call Joesph at [phone]

## 2024-01-17 NOTE — Progress Notes (Signed)
 Shawn Cornet, MD sent to Shawn Lam to schedule for gastrostomy tube placement.  [place]

## 2024-01-17 NOTE — Telephone Encounter (Signed)
 Spoke with Joesph and advised order was faxed on 01/16/24.  She gave me her direct fax #[phone] to refax orders to.  Re-faxed as requested.

## 2024-01-17 NOTE — Telephone Encounter (Signed)
 Spoke with Kerri from [facility], she states patient will be under Moderate Sedation and the best fax number is [phone].

## 2024-01-18 ENCOUNTER — Telehealth: Payer: Self-pay | Admitting: Family Medicine

## 2024-01-18 ENCOUNTER — Encounter: Admitting: Physical Therapy

## 2024-01-18 ENCOUNTER — Other Ambulatory Visit: Payer: Self-pay

## 2024-01-18 ENCOUNTER — Ambulatory Visit
Admission: RE | Admit: 2024-01-18 | Discharge: 2024-01-18 | Disposition: A | Discharge status: Home / Self Care | Source: Ambulatory Visit | Attending: Family Medicine | Admitting: Family Medicine

## 2024-01-18 ENCOUNTER — Ambulatory Visit: Admitting: Physical Therapy

## 2024-01-18 ENCOUNTER — Ambulatory Visit: Admitting: Speech Pathology

## 2024-01-18 VITALS — BP 155/97 | HR 61 | Temp 97.9°F | Resp 16 | Ht 72.0 in | Wt 115.0 lb

## 2024-01-18 DIAGNOSIS — Z981 Arthrodesis status: Secondary | ICD-10-CM | POA: Insufficient documentation

## 2024-01-18 DIAGNOSIS — Z955 Presence of coronary angioplasty implant and graft: Secondary | ICD-10-CM | POA: Insufficient documentation

## 2024-01-18 DIAGNOSIS — R1310 Dysphagia, unspecified: Secondary | ICD-10-CM | POA: Insufficient documentation

## 2024-01-18 DIAGNOSIS — I10 Essential (primary) hypertension: Secondary | ICD-10-CM | POA: Insufficient documentation

## 2024-01-18 DIAGNOSIS — Z79899 Other long term (current) drug therapy: Secondary | ICD-10-CM | POA: Diagnosis not present

## 2024-01-18 DIAGNOSIS — F419 Anxiety disorder, unspecified: Secondary | ICD-10-CM | POA: Diagnosis not present

## 2024-01-18 DIAGNOSIS — Z7902 Long term (current) use of antithrombotics/antiplatelets: Secondary | ICD-10-CM | POA: Insufficient documentation

## 2024-01-18 DIAGNOSIS — G4733 Obstructive sleep apnea (adult) (pediatric): Secondary | ICD-10-CM | POA: Diagnosis not present

## 2024-01-18 DIAGNOSIS — I2510 Atherosclerotic heart disease of native coronary artery without angina pectoris: Secondary | ICD-10-CM | POA: Diagnosis not present

## 2024-01-18 DIAGNOSIS — Z7982 Long term (current) use of aspirin: Secondary | ICD-10-CM | POA: Diagnosis not present

## 2024-01-18 DIAGNOSIS — E785 Hyperlipidemia, unspecified: Secondary | ICD-10-CM | POA: Insufficient documentation

## 2024-01-18 HISTORY — PX: IR GASTROSTOMY TUBE MOD SED: IMG625

## 2024-01-18 LAB — CBC
HCT: 39.7 % (ref 39.0–52.0)
Hemoglobin: 12.4 g/dL — ABNORMAL LOW (ref 13.0–17.0)
MCH: 33.1 pg (ref 26.0–34.0)
MCHC: 31.2 g/dL (ref 30.0–36.0)
MCV: 105.9 fL — ABNORMAL HIGH (ref 80.0–100.0)
Platelets: 213 K/uL (ref 150–400)
RBC: 3.75 MIL/uL — ABNORMAL LOW (ref 4.22–5.81)
RDW: 13 % (ref 11.5–15.5)
WBC: 6.3 K/uL (ref 4.0–10.5)
nRBC: 0 % (ref 0.0–0.2)

## 2024-01-18 LAB — PROTIME-INR
INR: 1 (ref 0.8–1.2)
Prothrombin Time: 13.5 s (ref 11.4–15.2)

## 2024-01-18 MED ORDER — STERILE WATER FOR INJECTION IJ SOLN
INTRAMUSCULAR | Status: AC
  Filled 2024-01-18: qty 10

## 2024-01-18 MED ORDER — SODIUM CHLORIDE 0.9 % IV SOLN: INTRAVENOUS | Status: DC

## 2024-01-18 MED ORDER — FENTANYL CITRATE (PF) 100 MCG/2ML IJ SOLN
INTRAMUSCULAR | Status: AC | PRN
  Administered 2024-01-18 (×3): 25 ug via INTRAVENOUS

## 2024-01-18 MED ORDER — SILVER NITRATE-POT NITRATE 75-25 % EX MISC
2.0000 | Freq: Once | CUTANEOUS | Status: AC
  Administered 2024-01-18: 2 via TOPICAL

## 2024-01-18 MED ORDER — MIDAZOLAM HCL 2 MG/2ML IJ SOLN
INTRAMUSCULAR | Status: AC | PRN
  Administered 2024-01-18 (×3): .5 mg via INTRAVENOUS

## 2024-01-18 MED ORDER — FENTANYL CITRATE (PF) 100 MCG/2ML IJ SOLN
INTRAMUSCULAR | Status: AC
  Filled 2024-01-18: qty 2

## 2024-01-18 MED ORDER — FENTANYL CITRATE (PF) 100 MCG/2ML IJ SOLN
25.0000 ug | Freq: Once | INTRAMUSCULAR | Status: AC
  Administered 2024-01-18: 25 ug via INTRAVENOUS

## 2024-01-18 MED ORDER — IOHEXOL 300 MG/ML  SOLN
18.0000 mL | Freq: Once | INTRAMUSCULAR | Status: AC | PRN
Start: 2024-01-18 — End: 2024-01-18
  Administered 2024-01-18: 18 mL

## 2024-01-18 MED ORDER — LIDOCAINE HCL 1 % IJ SOLN
INTRAMUSCULAR | Status: AC
  Filled 2024-01-18: qty 20

## 2024-01-18 MED ORDER — GLUCAGON HCL RDNA (DIAGNOSTIC) 1 MG IJ SOLR
INTRAMUSCULAR | Status: AC | PRN
  Administered 2024-01-18: 1 mg via INTRAVENOUS

## 2024-01-18 MED ORDER — LIDOCAINE HCL 1 % IJ SOLN
11.0000 mL | Freq: Once | INTRAMUSCULAR | Status: AC
Start: 2024-01-18 — End: 2024-01-18
  Administered 2024-01-18: 11 mL via INTRADERMAL

## 2024-01-18 MED ORDER — MIDAZOLAM HCL 2 MG/2ML IJ SOLN
INTRAMUSCULAR | Status: AC
  Filled 2024-01-18: qty 2

## 2024-01-18 MED ORDER — CEFAZOLIN SODIUM-DEXTROSE 2-4 GM/100ML-% IV SOLN: 2.0000 g | INTRAVENOUS | Status: DC

## 2024-01-18 MED ORDER — GLUCAGON HCL RDNA (DIAGNOSTIC) 1 MG IJ SOLR
INTRAMUSCULAR | Status: AC
Start: 2024-01-18 — End: 2024-01-18
  Filled 2024-01-18: qty 1

## 2024-01-18 MED ORDER — CEFAZOLIN SODIUM-DEXTROSE 2-4 GM/100ML-% IV SOLN
INTRAVENOUS | Status: AC
  Filled 2024-01-18: qty 100

## 2024-01-18 MED ORDER — SILVER NITRATE-POT NITRATE 75-25 % EX MISC
CUTANEOUS | Status: AC
  Filled 2024-01-18: qty 10

## 2024-01-18 NOTE — Telephone Encounter (Signed)
 Spoke with Shawn Lam.  She has faxed over new orders that need to be signed by Dr. Avelina and faxed back.

## 2024-01-18 NOTE — Telephone Encounter (Signed)
 Copied from CRM [id number]. Topic: Medical Record Request - Other >> Jan 18, 2024 11:56 AM Suzen RAMAN wrote: Reason for CRM: order form missing information on 2nd page.. start of care,length of need and refills caller requesting correction to copy received and request information be faxed back after corrections are made   CB# [phone]

## 2024-01-18 NOTE — Telephone Encounter (Signed)
 Signed form faxed to [phone].

## 2024-01-18 NOTE — Procedures (Signed)
 Interventional Radiology Procedure Note  Procedure: G tube placement  Complications: None  Estimated Blood Loss: < 10 mL  Findings: 12fr G tube placement with two gastropexy sutures.  Cordella DELENA Banner, MD

## 2024-01-18 NOTE — Telephone Encounter (Signed)
 Copied from CRM #[id number]. Topic: Referral - Status >> Jan 17, 2024  2:34 PM Dedra B wrote: Reason for CRM: Joesph from [facility] [employer] called regarding pt's tube feed referral. They have not received an order being placed tomorrow. Pls call Joesph at [phone] >> Jan 18, 2024 10:28 AM Rosina BIRCH wrote: Joesph from [facility] called stating she faxed orders over today and want to know if the office received them. Joesph stated the patient is getting tubes placed today at 2pm and they need those orders to give him supplies  [phone]

## 2024-01-18 NOTE — Telephone Encounter (Signed)
 Missing information completed and form faxed to [phone].

## 2024-01-19 ENCOUNTER — Ambulatory Visit: Payer: Self-pay | Admitting: Family Medicine

## 2024-01-19 ENCOUNTER — Telehealth: Payer: Self-pay | Admitting: *Deleted

## 2024-01-19 ENCOUNTER — Other Ambulatory Visit: Payer: Self-pay | Admitting: Family Medicine

## 2024-01-19 MED ORDER — HYDROCODONE-ACETAMINOPHEN 5-325 MG PO TABS: 1.0000 | ORAL_TABLET | Freq: Three times a day (TID) | ORAL | 0 refills | Status: AC | PRN

## 2024-01-19 NOTE — Telephone Encounter (Signed)
   Patient Name: Shawn Lam  DOB: [birth year] MRN: [id number]  Primary Cardiologist: Evalene Lunger, MD  Chart reviewed as part of pre-operative protocol coverage. Given past medical history and time since last visit, based on ACC/AHA guidelines, Shawn Lam is at acceptable risk for the planned procedure without further cardiovascular testing.   Low risk procedure Acceptable risk for procedure and to hold Plavix  5 days prior to intervention Thx TGollan  The patient was advised that if he develops new symptoms prior to surgery to contact our office to arrange for a follow-up visit, and he verbalized understanding.  I will route this recommendation to the requesting party via Epic fax function and remove from pre-op pool.  Please call with questions.  [name], NP 01/19/2024, 1:35 PM

## 2024-01-19 NOTE — Telephone Encounter (Signed)
 [name] notified as instructed by telephone.  Patient states understanding.  Home Health has been set up already for his feeds.

## 2024-01-19 NOTE — Telephone Encounter (Signed)
 Copied from CRM [id number]. Topic: General - Other >> Jan 19, 2024  8:27 AM Pinkey ORN wrote: Reason for CRM: Avelina Greig BRAVO, MD >> Jan 19, 2024  8:28 AM Pinkey ORN wrote: Patient states he had a procedure done yesterday and wants to know if Avelina Greig BRAVO, MD could call him something in for pain. Please follow up with patient.

## 2024-01-22 ENCOUNTER — Emergency Department

## 2024-01-22 ENCOUNTER — Encounter: Payer: Self-pay | Admitting: Medical Oncology

## 2024-01-22 ENCOUNTER — Other Ambulatory Visit: Payer: Self-pay

## 2024-01-22 ENCOUNTER — Telehealth: Payer: Self-pay

## 2024-01-22 ENCOUNTER — Inpatient Hospital Stay
Admission: EM | Admit: 2024-01-22 | Discharge: 2024-01-24 | DRG: 177 | Disposition: A | Discharge status: Home Health | Attending: Family Medicine | Admitting: Family Medicine

## 2024-01-22 DIAGNOSIS — G4700 Insomnia, unspecified: Secondary | ICD-10-CM | POA: Diagnosis present

## 2024-01-22 DIAGNOSIS — Z9861 Coronary angioplasty status: Secondary | ICD-10-CM

## 2024-01-22 DIAGNOSIS — Z1152 Encounter for screening for COVID-19: Secondary | ICD-10-CM | POA: Diagnosis not present

## 2024-01-22 DIAGNOSIS — Z681 Body mass index (BMI) 19 or less, adult: Secondary | ICD-10-CM | POA: Diagnosis not present

## 2024-01-22 DIAGNOSIS — R1111 Vomiting without nausea: Secondary | ICD-10-CM | POA: Diagnosis not present

## 2024-01-22 DIAGNOSIS — J690 Pneumonitis due to inhalation of food and vomit: Secondary | ICD-10-CM | POA: Diagnosis present

## 2024-01-22 DIAGNOSIS — Z79899 Other long term (current) drug therapy: Secondary | ICD-10-CM

## 2024-01-22 DIAGNOSIS — F32A Depression, unspecified: Secondary | ICD-10-CM | POA: Diagnosis present

## 2024-01-22 DIAGNOSIS — R1310 Dysphagia, unspecified: Secondary | ICD-10-CM | POA: Diagnosis present

## 2024-01-22 DIAGNOSIS — Z9079 Acquired absence of other genital organ(s): Secondary | ICD-10-CM

## 2024-01-22 DIAGNOSIS — F419 Anxiety disorder, unspecified: Secondary | ICD-10-CM | POA: Diagnosis present

## 2024-01-22 DIAGNOSIS — I129 Hypertensive chronic kidney disease with stage 1 through stage 4 chronic kidney disease, or unspecified chronic kidney disease: Secondary | ICD-10-CM | POA: Diagnosis present

## 2024-01-22 DIAGNOSIS — K5900 Constipation, unspecified: Secondary | ICD-10-CM | POA: Diagnosis present

## 2024-01-22 DIAGNOSIS — W19XXXA Unspecified fall, initial encounter: Secondary | ICD-10-CM | POA: Diagnosis not present

## 2024-01-22 DIAGNOSIS — Z85828 Personal history of other malignant neoplasm of skin: Secondary | ICD-10-CM

## 2024-01-22 DIAGNOSIS — E039 Hypothyroidism, unspecified: Secondary | ICD-10-CM | POA: Diagnosis present

## 2024-01-22 DIAGNOSIS — Z885 Allergy status to narcotic agent status: Secondary | ICD-10-CM

## 2024-01-22 DIAGNOSIS — I2510 Atherosclerotic heart disease of native coronary artery without angina pectoris: Secondary | ICD-10-CM | POA: Diagnosis present

## 2024-01-22 DIAGNOSIS — L570 Actinic keratosis: Secondary | ICD-10-CM | POA: Diagnosis present

## 2024-01-22 DIAGNOSIS — J189 Pneumonia, unspecified organism: Principal | ICD-10-CM | POA: Diagnosis present

## 2024-01-22 DIAGNOSIS — I731 Thromboangiitis obliterans [Buerger's disease]: Secondary | ICD-10-CM | POA: Diagnosis present

## 2024-01-22 DIAGNOSIS — R4182 Altered mental status, unspecified: Secondary | ICD-10-CM

## 2024-01-22 DIAGNOSIS — J9611 Chronic respiratory failure with hypoxia: Secondary | ICD-10-CM | POA: Diagnosis present

## 2024-01-22 DIAGNOSIS — Z7902 Long term (current) use of antithrombotics/antiplatelets: Secondary | ICD-10-CM

## 2024-01-22 DIAGNOSIS — N1831 Chronic kidney disease, stage 3a: Secondary | ICD-10-CM | POA: Diagnosis present

## 2024-01-22 DIAGNOSIS — R1084 Generalized abdominal pain: Secondary | ICD-10-CM

## 2024-01-22 DIAGNOSIS — Z87442 Personal history of urinary calculi: Secondary | ICD-10-CM

## 2024-01-22 DIAGNOSIS — Z931 Gastrostomy status: Secondary | ICD-10-CM

## 2024-01-22 DIAGNOSIS — E43 Unspecified severe protein-calorie malnutrition: Secondary | ICD-10-CM | POA: Diagnosis present

## 2024-01-22 DIAGNOSIS — R112 Nausea with vomiting, unspecified: Secondary | ICD-10-CM

## 2024-01-22 DIAGNOSIS — R1110 Vomiting, unspecified: Secondary | ICD-10-CM | POA: Diagnosis not present

## 2024-01-22 DIAGNOSIS — Z043 Encounter for examination and observation following other accident: Secondary | ICD-10-CM | POA: Diagnosis not present

## 2024-01-22 DIAGNOSIS — G4733 Obstructive sleep apnea (adult) (pediatric): Secondary | ICD-10-CM | POA: Diagnosis present

## 2024-01-22 DIAGNOSIS — Z9109 Other allergy status, other than to drugs and biological substances: Secondary | ICD-10-CM

## 2024-01-22 DIAGNOSIS — F411 Generalized anxiety disorder: Secondary | ICD-10-CM | POA: Diagnosis present

## 2024-01-22 DIAGNOSIS — Z79890 Hormone replacement therapy (postmenopausal): Secondary | ICD-10-CM | POA: Diagnosis not present

## 2024-01-22 DIAGNOSIS — I700 Atherosclerosis of aorta: Secondary | ICD-10-CM | POA: Diagnosis not present

## 2024-01-22 DIAGNOSIS — E785 Hyperlipidemia, unspecified: Secondary | ICD-10-CM | POA: Diagnosis present

## 2024-01-22 DIAGNOSIS — Z8249 Family history of ischemic heart disease and other diseases of the circulatory system: Secondary | ICD-10-CM

## 2024-01-22 DIAGNOSIS — Z7982 Long term (current) use of aspirin: Secondary | ICD-10-CM

## 2024-01-22 DIAGNOSIS — R410 Disorientation, unspecified: Secondary | ICD-10-CM | POA: Diagnosis not present

## 2024-01-22 DIAGNOSIS — J188 Other pneumonia, unspecified organism: Secondary | ICD-10-CM | POA: Diagnosis present

## 2024-01-22 DIAGNOSIS — K219 Gastro-esophageal reflux disease without esophagitis: Secondary | ICD-10-CM | POA: Diagnosis present

## 2024-01-22 DIAGNOSIS — Z981 Arthrodesis status: Secondary | ICD-10-CM

## 2024-01-22 LAB — CBC WITH DIFFERENTIAL/PLATELET
Abs Immature Granulocytes: 0.16 K/uL — ABNORMAL HIGH (ref 0.00–0.07)
Basophils Absolute: 0 K/uL (ref 0.0–0.1)
Basophils Relative: 0 %
Eosinophils Absolute: 0 K/uL (ref 0.0–0.5)
Eosinophils Relative: 0 %
HCT: 36.1 % — ABNORMAL LOW (ref 39.0–52.0)
Hemoglobin: 11.3 g/dL — ABNORMAL LOW (ref 13.0–17.0)
Immature Granulocytes: 1 %
Lymphocytes Relative: 9 %
Lymphs Abs: 1.1 K/uL (ref 0.7–4.0)
MCH: 32.7 pg (ref 26.0–34.0)
MCHC: 31.3 g/dL (ref 30.0–36.0)
MCV: 104.3 fL — ABNORMAL HIGH (ref 80.0–100.0)
Monocytes Absolute: 0.6 K/uL (ref 0.1–1.0)
Monocytes Relative: 5 %
Neutro Abs: 9.9 K/uL — ABNORMAL HIGH (ref 1.7–7.7)
Neutrophils Relative %: 85 %
Platelets: 204 K/uL (ref 150–400)
RBC: 3.46 MIL/uL — ABNORMAL LOW (ref 4.22–5.81)
RDW: 13.2 % (ref 11.5–15.5)
WBC: 11.8 K/uL — ABNORMAL HIGH (ref 4.0–10.5)
nRBC: 0 % (ref 0.0–0.2)

## 2024-01-22 LAB — URINALYSIS, ROUTINE W REFLEX MICROSCOPIC
Bilirubin Urine: NEGATIVE
Glucose, UA: NEGATIVE mg/dL
Hgb urine dipstick: NEGATIVE
Ketones, ur: NEGATIVE mg/dL
Leukocytes,Ua: NEGATIVE
Nitrite: NEGATIVE
Protein, ur: NEGATIVE mg/dL
Specific Gravity, Urine: 1.027 (ref 1.005–1.030)
pH: 7 (ref 5.0–8.0)

## 2024-01-22 LAB — COMPREHENSIVE METABOLIC PANEL WITH GFR
ALT: 15 U/L (ref 0–44)
AST: 30 U/L (ref 15–41)
Albumin: 3.2 g/dL — ABNORMAL LOW (ref 3.5–5.0)
Alkaline Phosphatase: 50 U/L (ref 38–126)
Anion gap: 14 (ref 5–15)
BUN: 40 mg/dL — ABNORMAL HIGH (ref 8–23)
CO2: 25 mmol/L (ref 22–32)
Calcium: 9.5 mg/dL (ref 8.9–10.3)
Chloride: 99 mmol/L (ref 98–111)
Creatinine, Ser: 1.37 mg/dL — ABNORMAL HIGH (ref 0.61–1.24)
GFR, Estimated: 54 mL/min — ABNORMAL LOW (ref >60)
Glucose, Bld: 102 mg/dL — ABNORMAL HIGH (ref 70–99)
Potassium: 4.9 mmol/L (ref 3.5–5.1)
Sodium: 138 mmol/L (ref 135–145)
Total Bilirubin: 0.8 mg/dL (ref 0.0–1.2)
Total Protein: 6.1 g/dL — ABNORMAL LOW (ref 6.5–8.1)

## 2024-01-22 LAB — TROPONIN I (HIGH SENSITIVITY)
Troponin I (High Sensitivity): 4 ng/L (ref <18)
Troponin I (High Sensitivity): 4 ng/L (ref <18)

## 2024-01-22 LAB — LACTIC ACID, PLASMA
Lactic Acid, Venous: 1.2 mmol/L (ref 0.5–1.9)
Lactic Acid, Venous: 1.8 mmol/L (ref 0.5–1.9)

## 2024-01-22 LAB — LIPASE, BLOOD: Lipase: 30 U/L (ref 11–51)

## 2024-01-22 LAB — RESP PANEL BY RT-PCR (RSV, FLU A&B, COVID)  RVPGX2
Influenza A by PCR: NEGATIVE
Influenza B by PCR: NEGATIVE
Resp Syncytial Virus by PCR: NEGATIVE
SARS Coronavirus 2 by RT PCR: NEGATIVE

## 2024-01-22 LAB — PHOSPHORUS: Phosphorus: 3.4 mg/dL (ref 2.5–4.6)

## 2024-01-22 LAB — PROCALCITONIN: Procalcitonin: 1.11 ng/mL

## 2024-01-22 LAB — MAGNESIUM: Magnesium: 2.1 mg/dL (ref 1.7–2.4)

## 2024-01-22 MED ORDER — ATORVASTATIN CALCIUM 20 MG PO TABS
80.0000 mg | ORAL_TABLET | Freq: Every day | ORAL | Status: DC
  Administered 2024-01-23 – 2024-01-24 (×2): 80 mg
  Filled 2024-01-22 (×2): qty 4

## 2024-01-22 MED ORDER — ASPIRIN 81 MG PO TBEC: 81.0000 mg | DELAYED_RELEASE_TABLET | Freq: Every day | ORAL | Status: DC

## 2024-01-22 MED ORDER — METOPROLOL SUCCINATE ER 25 MG PO TB24
25.0000 mg | ORAL_TABLET | Freq: Every day | ORAL | Status: DC
Start: 2024-01-22 — End: 2024-01-22

## 2024-01-22 MED ORDER — SODIUM CHLORIDE 0.9 % IV SOLN
1.0000 g | Freq: Once | INTRAVENOUS | Status: AC
  Administered 2024-01-22: 1 g via INTRAVENOUS
  Filled 2024-01-22: qty 10

## 2024-01-22 MED ORDER — SODIUM CHLORIDE 0.9 % IV SOLN: 500.0000 mg | Freq: Once | INTRAVENOUS | Status: DC

## 2024-01-22 MED ORDER — METOCLOPRAMIDE HCL 10 MG PO TABS
10.0000 mg | ORAL_TABLET | Freq: Three times a day (TID) | ORAL | Status: DC | PRN
Start: 2024-01-22 — End: 2024-01-24

## 2024-01-22 MED ORDER — SODIUM CHLORIDE 0.9 % IV SOLN
3.0000 g | Freq: Four times a day (QID) | INTRAVENOUS | Status: DC
  Administered 2024-01-22 – 2024-01-24 (×9): 3 g via INTRAVENOUS
  Filled 2024-01-22 (×10): qty 8

## 2024-01-22 MED ORDER — LACTATED RINGERS IV BOLUS
1000.0000 mL | Freq: Once | INTRAVENOUS | Status: AC
  Administered 2024-01-22: 1000 mL via INTRAVENOUS

## 2024-01-22 MED ORDER — SODIUM CHLORIDE 0.9% FLUSH
3.0000 mL | Freq: Two times a day (BID) | INTRAVENOUS | Status: DC
  Administered 2024-01-22 – 2024-01-24 (×5): 3 mL via INTRAVENOUS

## 2024-01-22 MED ORDER — ZOLPIDEM TARTRATE 5 MG PO TABS
10.0000 mg | ORAL_TABLET | Freq: Every evening | ORAL | Status: DC | PRN
Start: 2024-01-22 — End: 2024-01-24
  Administered 2024-01-23: 10 mg via ORAL
  Filled 2024-01-22: qty 2

## 2024-01-22 MED ORDER — ASPIRIN 81 MG PO CHEW
81.0000 mg | CHEWABLE_TABLET | Freq: Every day | ORAL | Status: DC
Start: 2024-01-22 — End: 2024-01-24
  Administered 2024-01-22 – 2024-01-24 (×3): 81 mg
  Filled 2024-01-22 (×3): qty 1

## 2024-01-22 MED ORDER — TRIAMCINOLONE ACETONIDE 0.1 % EX CREA
TOPICAL_CREAM | Freq: Every day | CUTANEOUS | Status: DC | PRN
Start: 2024-01-22 — End: 2024-01-24

## 2024-01-22 MED ORDER — CYANOCOBALAMIN 500 MCG PO TABS: 1000.0000 ug | ORAL_TABLET | Freq: Every day | ORAL | Status: DC

## 2024-01-22 MED ORDER — OSMOLITE 1.5 CAL PO LIQD
120.0000 mL | Freq: Every day | ORAL | Status: DC
  Administered 2024-01-23 – 2024-01-24 (×9): 120 mL

## 2024-01-22 MED ORDER — ASPIRIN 81 MG PO CHEW: 81.0000 mg | CHEWABLE_TABLET | Freq: Every day | ORAL | Status: DC

## 2024-01-22 MED ORDER — FINASTERIDE 5 MG PO TABS
5.0000 mg | ORAL_TABLET | Freq: Every day | ORAL | Status: DC
  Administered 2024-01-22 – 2024-01-24 (×3): 5 mg via ORAL
  Filled 2024-01-22 (×3): qty 1

## 2024-01-22 MED ORDER — BACLOFEN 10 MG PO TABS
10.0000 mg | ORAL_TABLET | Freq: Two times a day (BID) | ORAL | Status: DC
Start: 2024-01-22 — End: 2024-01-22

## 2024-01-22 MED ORDER — VITAMIN B-12 1000 MCG PO TABS
1000.0000 ug | ORAL_TABLET | Freq: Every day | ORAL | Status: DC
  Administered 2024-01-23 – 2024-01-24 (×2): 1000 ug
  Filled 2024-01-22 (×2): qty 1

## 2024-01-22 MED ORDER — METOPROLOL TARTRATE 25 MG/10 ML ORAL SUSPENSION
10.0000 mg | Freq: Two times a day (BID) | ORAL | Status: DC
  Administered 2024-01-22 – 2024-01-24 (×5): 10 mg
  Filled 2024-01-22 (×6): qty 10

## 2024-01-22 MED ORDER — CLOPIDOGREL BISULFATE 75 MG PO TABS
75.0000 mg | ORAL_TABLET | Freq: Every day | ORAL | Status: DC
  Administered 2024-01-23 – 2024-01-24 (×2): 75 mg
  Filled 2024-01-22 (×2): qty 1

## 2024-01-22 MED ORDER — LACTULOSE 10 GM/15ML PO SOLN
20.0000 g | Freq: Two times a day (BID) | ORAL | Status: AC
  Administered 2024-01-22: 20 g
  Filled 2024-01-22 (×2): qty 30

## 2024-01-22 MED ORDER — CLOPIDOGREL BISULFATE 75 MG PO TABS: 75.0000 mg | ORAL_TABLET | Freq: Every day | ORAL | Status: DC

## 2024-01-22 MED ORDER — ATORVASTATIN CALCIUM 20 MG PO TABS: 80.0000 mg | ORAL_TABLET | Freq: Every day | ORAL | Status: DC

## 2024-01-22 MED ORDER — THIAMINE HCL 100 MG PO TABS
100.0000 mg | ORAL_TABLET | Freq: Every day | ORAL | Status: DC
  Administered 2024-01-23 – 2024-01-24 (×2): 100 mg
  Filled 2024-01-22 (×3): qty 1

## 2024-01-22 MED ORDER — ZONISAMIDE 25 MG PO CAPS
25.0000 mg | ORAL_CAPSULE | Freq: Every day | ORAL | Status: DC
  Administered 2024-01-22 – 2024-01-24 (×3): 25 mg via ORAL
  Filled 2024-01-22 (×3): qty 1

## 2024-01-22 MED ORDER — OSMOLITE 1.5 CAL PO LIQD
80.0000 mL | Freq: Every day | ORAL | Status: AC
  Administered 2024-01-22 – 2024-01-23 (×4): 80 mL

## 2024-01-22 MED ORDER — DULOXETINE HCL 30 MG PO CPEP
90.0000 mg | ORAL_CAPSULE | Freq: Every day | ORAL | Status: DC
  Administered 2024-01-22 – 2024-01-24 (×3): 90 mg via ORAL
  Filled 2024-01-22: qty 3
  Filled 2024-01-22: qty 1
  Filled 2024-01-22: qty 3

## 2024-01-22 MED ORDER — IOHEXOL 300 MG/ML  SOLN
100.0000 mL | Freq: Once | INTRAMUSCULAR | Status: AC | PRN
  Administered 2024-01-22: 100 mL via INTRAVENOUS

## 2024-01-22 MED ORDER — LEVOTHYROXINE SODIUM 88 MCG PO TABS: 88.0000 ug | ORAL_TABLET | Freq: Every day | ORAL | Status: DC

## 2024-01-22 MED ORDER — LEVOTHYROXINE SODIUM 88 MCG PO TABS
88.0000 ug | ORAL_TABLET | Freq: Every day | ORAL | Status: DC
  Administered 2024-01-23 – 2024-01-24 (×2): 88 ug
  Filled 2024-01-22 (×2): qty 1

## 2024-01-22 MED ORDER — MAGNESIUM OXIDE -MG SUPPLEMENT 400 (240 MG) MG PO TABS
400.0000 mg | ORAL_TABLET | Freq: Every day | ORAL | Status: DC
  Administered 2024-01-23 – 2024-01-24 (×2): 400 mg
  Filled 2024-01-22 (×2): qty 1

## 2024-01-22 MED ORDER — METRONIDAZOLE 500 MG/100ML IV SOLN
500.0000 mg | Freq: Once | INTRAVENOUS | Status: DC
Start: 2024-01-22 — End: 2024-01-22

## 2024-01-22 MED ORDER — MAGNESIUM OXIDE -MG SUPPLEMENT 400 (240 MG) MG PO TABS: 400.0000 mg | ORAL_TABLET | Freq: Every day | ORAL | Status: DC

## 2024-01-22 MED ORDER — BACLOFEN 10 MG PO TABS
10.0000 mg | ORAL_TABLET | Freq: Two times a day (BID) | ORAL | Status: DC
  Administered 2024-01-22 – 2024-01-24 (×4): 10 mg
  Filled 2024-01-22 (×4): qty 1

## 2024-01-22 MED ORDER — HYDROCODONE-ACETAMINOPHEN 5-325 MG PO TABS
1.0000 | ORAL_TABLET | Freq: Three times a day (TID) | ORAL | Status: DC | PRN
  Administered 2024-01-22 – 2024-01-23 (×2): 1 via ORAL
  Filled 2024-01-22 (×2): qty 1

## 2024-01-22 MED ORDER — FREE WATER
100.0000 mL | Freq: Every day | Status: DC
  Administered 2024-01-22 – 2024-01-24 (×13): 100 mL
  Filled 2024-01-22 (×3): qty 100

## 2024-01-22 MED ORDER — BUSPIRONE HCL 10 MG PO TABS
20.0000 mg | ORAL_TABLET | Freq: Three times a day (TID) | ORAL | Status: DC
Start: 2024-01-22 — End: 2024-01-24
  Administered 2024-01-22 – 2024-01-24 (×7): 20 mg
  Filled 2024-01-22: qty 2
  Filled 2024-01-22: qty 4
  Filled 2024-01-22 (×5): qty 2

## 2024-01-22 MED ORDER — ENOXAPARIN SODIUM 40 MG/0.4ML IJ SOSY
40.0000 mg | PREFILLED_SYRINGE | INTRAMUSCULAR | Status: DC
  Administered 2024-01-22 – 2024-01-23 (×2): 40 mg via SUBCUTANEOUS
  Filled 2024-01-22 (×2): qty 0.4

## 2024-01-22 MED ORDER — BUPROPION HCL ER (XL) 300 MG PO TB24
450.0000 mg | ORAL_TABLET | Freq: Every day | ORAL | Status: DC
  Administered 2024-01-22 – 2024-01-24 (×3): 450 mg via ORAL
  Filled 2024-01-22: qty 1
  Filled 2024-01-22: qty 3
  Filled 2024-01-22: qty 1

## 2024-01-22 MED ORDER — BUSPIRONE HCL 5 MG PO TABS
20.0000 mg | ORAL_TABLET | Freq: Three times a day (TID) | ORAL | Status: DC
Start: 2024-01-22 — End: 2024-01-22

## 2024-01-22 MED ORDER — LOSARTAN POTASSIUM 50 MG PO TABS: 50.0000 mg | ORAL_TABLET | Freq: Every day | ORAL | Status: DC

## 2024-01-22 MED ORDER — PIPERACILLIN-TAZOBACTAM 3.375 G IVPB
3.3750 g | Freq: Three times a day (TID) | INTRAVENOUS | Status: DC
  Administered 2024-01-22: 3.375 g via INTRAVENOUS
  Filled 2024-01-22: qty 50

## 2024-01-22 NOTE — Telephone Encounter (Signed)
 Form in Dr. Sherrel office in box to review to see if screening is appropriate.

## 2024-01-22 NOTE — ED Notes (Addendum)
 Assumed care of pt from [facility]. Introduced self to pt and wife at bedside. Call bell in reach. NAD noted at this time.

## 2024-01-22 NOTE — ED Provider Notes (Signed)
 CT chest abdomen pelvis reviewed, free air suspected to be from recent G-tube procedure.  Reevaluate the patient, no abdominal tenderness at this time.  Discussed with Dr. Jenna of IR who examined the images and feels typical postprocedure CT no concerning findings  Does have multifocal pneumonia on CT scan this is likely the cause of his increased weakness and confusion, will add lactic, blood cultures, IV antibiotics, have discussed with the hospitalist for admission   Shawn Charleston, MD 01/22/24 0840

## 2024-01-22 NOTE — ED Notes (Signed)
 Reached out to provider through secure chat at this time for diet orders per wife's request.

## 2024-01-22 NOTE — Telephone Encounter (Signed)
 Copied from CRM [id number]. Topic: General - Other >> Jan 22, 2024  9:11 AM Berneda FALCON wrote: Reason for CRM: Allie from [facility] is calling to check on the status of the neurology screening they sent over on 10/6. States they spoke to [place] who informed her that the orders were received and they wanted to check on the status of these orders.  Please contact Allie back at - [phone] and states she refaxed this over today as well.

## 2024-01-22 NOTE — ED Provider Notes (Signed)
 [facility] Provider Note    Event Date/Time   First MD Initiated Contact with Patient 01/22/24 [phone]     (approximate)   History   Fall   HPI  Shawn Lam is a 73 y.o. male who presents to the ED for evaluation of Fall   Review of PCP visit from 9/19.  Silent aspiration since ACDF surgery 03/2023, recently placed PEG tube a few days ago. Otherwise history of HTN, CAD, OSA, CKD  Patient presents to the ED from home after a fall.  He reports feeling fine and has no complaints, he is pleasantly disoriented  More information obtained when wife arrives at bedside shortly after he does.  Was doing okay after PEG tube was placed on Thursday.  Tubes been functioning.  The past 12-24 hours though he has become more confused, has vomited multiple times, weak and not acting himself.  She finally got him to sleep tonight but he awoke around 4 asking for help to go to the bathroom, she was trying to help him to the restroom when they both fell precipitating EMS call.   Physical Exam   Triage Vital Signs: ED Triage Vitals  Encounter Vitals Group     BP      Girls Systolic BP Percentile      Girls Diastolic BP Percentile      Boys Systolic BP Percentile      Boys Diastolic BP Percentile      Pulse      Resp      Temp      Temp src      SpO2      Weight      Height      Head Circumference      Peak Flow      Pain Score      Pain Loc      Pain Education      Exclude from Growth Chart     Most recent vital signs: Vitals:   01/22/24 0506  BP: 107/64  Pulse: 88  Resp: 16  Temp: 97.8 F (36.6 C)  SpO2: 98%    General: Awake, no distress.  Frail, thin and cachectic.  Pleasantly disoriented.  Realizes he is in [place] but thinks the year is 21.  Reports feeling well without complaints CV:  Good peripheral perfusion.  Resp:  Normal effort.  Abd:  No distention.   Diffuse tenderness is present PEG tube in place to left sided abdomen, small  amount of dried blood around the insertion site without superimposed infectious features at the skin. MSK:  No deformity noted.  Neuro:  No focal deficits appreciated. Other:     ED Results / Procedures / Treatments   Labs (all labs ordered are listed, but only abnormal results are displayed) Labs Reviewed  COMPREHENSIVE METABOLIC PANEL WITH GFR - Abnormal; Notable for the following components:      Result Value   Glucose, Bld 102 (*)    BUN 40 (*)    Creatinine, Ser 1.37 (*)    Total Protein 6.1 (*)    Albumin 3.2 (*)    GFR, Estimated 54 (*)    All other components within normal limits  CBC WITH DIFFERENTIAL/PLATELET - Abnormal; Notable for the following components:   WBC 11.8 (*)    RBC 3.46 (*)    Hemoglobin 11.3 (*)    HCT 36.1 (*)    MCV 104.3 (*)    Neutro Abs 9.9 (*)  Abs Immature Granulocytes 0.16 (*)    All other components within normal limits  RESP PANEL BY RT-PCR (RSV, FLU A&B, COVID)  RVPGX2  MAGNESIUM   PHOSPHORUS  URINALYSIS, ROUTINE W REFLEX MICROSCOPIC  LIPASE, BLOOD  TROPONIN I (HIGH SENSITIVITY)  TROPONIN I (HIGH SENSITIVITY)    EKG Sinus rhythm with a rate of 88 bpm.  Normal axis.  QTc 539.  No STEMI  RADIOLOGY 1 view CXR interpreted by me with free air under the diaphragm  Official radiology report(s): DG Chest Portable 1 View Result Date: 01/22/2024 CLINICAL DATA:  Fall. EXAM: PORTABLE CHEST 1 VIEW COMPARISON:  04/25/2022 FINDINGS: The lungs are clear without focal pneumonia, edema, pneumothorax or pleural effusion. The cardiopericardial silhouette is within normal limits for size. No acute bony abnormality. Battery pack for stimulator device overlies the right chest as before. Loop recorder noted medial lower left chest. No acute bony abnormality. Telemetry leads overlie the chest. There is lucency under both hemidiaphragms compatible with intraperitoneal free gas. IMPRESSION: Imaging features compatible with intraperitoneal free gas. Given the  history of G-tube placement 4 days ago, this may be procedure related. Correlate clinically and CT abdomen/pelvis with contrast would be helpful to further evaluate as clinically warranted. I discussed these findings by telephone with Dr. Claudene at approximately 0559 hours on 01/22/2024. Electronically Signed   By: Camellia Candle M.D.   On: 01/22/2024 06:00    PROCEDURES and INTERVENTIONS:  Procedures  Medications  lactated ringers  bolus 1,000 mL (1,000 mLs Intravenous New Bag/Given 01/22/24 0525)  iohexol  (OMNIPAQUE ) 300 MG/ML solution 100 mL (100 mLs Intravenous Contrast Given 01/22/24 0711)     IMPRESSION / MDM / ASSESSMENT AND PLAN / ED COURSE  I reviewed the triage vital signs and the nursing notes.  Differential diagnosis includes, but is not limited to, PEG tube dislodged, peritonitic abdomen or sepsis, ICH, stroke, seizure  {Patient presents with symptoms of an acute illness or injury that is potentially life-threatening.  Patient with recent PEG tube placement presents after a fall with confusion and emesis.  Nonfocal exam, disoriented and diffuse abdominal tenderness with PEG tube seemingly in place.  Marginal leukocytosis.  Mild AKI.  Normal electrolytes, negative viral swabs.  CXR with free air to the diaphragm, this could be normal after his recent PEG tube placement though possibly pathologic considering overall syndrome of his confusion, tenderness and emesis.  Awaiting CT scans.    Clinical Course as of 01/22/24 0724  Mon Jan 22, 2024  0525 [place], wife now at the bedside to provide supplemental history [DS]  0559 Call from rads, looks like free air. Some can be normal after PEG placement, hard to say if this is pathologic or not [DS]  [phone] Reassessed and discussed my concerns on x-ray with patient and the wife.  He remains encephalopathic.  He feels hot to the touch to me, I checked oral temperature, 97.8.  We discussed likely medical admission. [DS]    Clinical  Course User Index [DS] Claudene Rover, MD     FINAL CLINICAL IMPRESSION(S) / ED DIAGNOSES   Final diagnoses:  Fall, initial encounter  Generalized abdominal pain  Nausea and vomiting, unspecified vomiting type     Rx / DC Orders   ED Discharge Orders     None        Note:  This document was prepared using Dragon voice recognition software and may include unintentional dictation errors.   Claudene Rover, MD 01/25/24 [phone]

## 2024-01-22 NOTE — ED Notes (Signed)
 Medications ordered for 1015 not verified by pharmacy at this time.

## 2024-01-22 NOTE — ED Triage Notes (Addendum)
 Pt from home via ems- wife called EMS. Pt had witnessed fall, states that he was feeling like he was going to pass out then fell. On plavix . Pt reports pain to shoulder blades. Pt alert but confused. Unsure of baseline.

## 2024-01-22 NOTE — H&P (Signed)
 History and Physical    Patient: Shawn Lam FMW:[id number] DOB: Aug 12, 1950 DOA: 01/22/2024 DOS: the patient was seen and examined on 01/22/2024 PCP: Avelina Greig BRAVO, MD  Patient coming from: Home-lives with wife  Chief Complaint:  Chief Complaint  Patient presents with   Fall   HPI: Shawn Lam is a 73 y.o. male with medical history significant of Buerger's disease, dyslipidemia, GAD, CAD, GERD, sleep apnea, hypothyroidism, CKD stage IIIa, silent aspiration in context of dysphagia, chronic hypoxemia on nasal cannula oxygen , and prior ACDF surgery.  Patient was brought to the ER by EMS after having a witnessed fall.  Prior to this he is stated he was feeling dizzy and was unable to stand any longer.  Upon presentation he was alert but confused.  Wife states that patient has been having multiple episodes of feeling dizzy and weak upon standing.  Of note patient had a PEG tube placed on 10/9 in radiology.  He had been started on bolus feedings but was not tolerating these well and over the past 24 hours has had multiple episodes of emesis.  In the ED he was noted to have an elevated white count of 11,800.  CT of the chest revealed multifocal pneumonia and an incidental finding of constipation.  Of note there was an incidental finding of possible free air but after review with the interventional radiologist who placed the PEG tube Dr. Jenna this was an expected amount of free air and it was deemed appropriate to use the PEG tube.  Viral respiratory panel is negative.  Hospitalist service has been asked to evaluate the patient for admission.  Review of Systems: As mentioned in the history of present illness. All other systems reviewed and are negative. Past Medical History:  Diagnosis Date   Actinic keratosis 02/04/2021   right lateral neck   Anemia    Anxiety    Asymptomatic LV dysfunction    50-55% by echo 06/2016   BCC (basal cell carcinoma of skin) 08/05/2020   left neck infra  auricular - nodular pattern - treated with [facility] 09/17/2020   Berger's disease    CAD (coronary artery disease) 06/2005   PCI of LAD and RCA   Depression    Dilated aortic root    4cm at sinus of Valsalva   Hematuria    Hyperlipidemia    Hypothyroidism    Insomnia    Left ureteral stone    Orthostatic hypotension    negative tilt table   OSA (obstructive sleep apnea)    moderate with AHI 17/hr, uses CPAP nightly(01/15/19 - has Inspire implant)   PVC's (premature ventricular contractions) 07/07/2016   Renal disorder    PT IS SEEING DR. BETSEY- NEPHROLOGIST   Rosacea    Urticaria    Past Surgical History:  Procedure Laterality Date   ANTERIOR CERVICAL DECOMP/DISCECTOMY FUSION N/A 03/13/2023   Procedure: CERVICAL FOUR-FIVE, CERVICAL FIVE-SIX ANTERIOR CERVICAL DISCECTOMY/DECOMPRESSION FUSION;  Surgeon: Onetha Kuba, MD;  Location: [facility] OR;  Service: Neurosurgery;  Laterality: N/A;  3C   CARDIAC CATHETERIZATION  09/06/2013   [facility]   CARDIAC CATHETERIZATION     COLONOSCOPY WITH PROPOFOL  N/A 01/21/2019   Procedure: COLONOSCOPY WITH PROPOFOL ;  Surgeon: Jinny Carmine, MD;  Location: [facility];  Service: Endoscopy;  Laterality: N/A;  sleep apnea   COLONOSCOPY WITH PROPOFOL  N/A 10/19/2021   Procedure: COLONOSCOPY WITH PROPOFOL ;  Surgeon: Jinny Carmine, MD;  Location: [facility] ENDOSCOPY;  Service: Endoscopy;  Laterality: N/A;   CORONARY ANGIOPLASTY  2004   STENT PLACEMENT   CYSTOSCOPY WITH RETROGRADE PYELOGRAM, URETEROSCOPY AND STENT PLACEMENT Left 03/19/2014   Procedure: CYSTOSCOPY WITH RETROGRADE PYELOGRAM, URETEROSCOPY AND STENT PLACEMENT;  Surgeon: Mickey Ricardo KATHEE Alvaro, MD;  Location: [facility] ORS;  Service: Urology;  Laterality: Left;   CYSTOSCOPY WITH RETROGRADE PYELOGRAM, URETEROSCOPY AND STENT PLACEMENT Bilateral 05/20/2015   Procedure:  CYSTOSCOPY WITH BILATERAL RETROGRADE PYELOGRAM,RIGHT  DIAGNOSTIC URETEROSCOPY ,LEFT URETEROSCOPY WITH HOLMIUM LASER  AND BILATERAL  STENT PLACEMENT ;  Surgeon:  Ricardo Alvaro, MD;  Location: [facility];  Service: Urology;  Laterality: Bilateral;   DRUG INDUCED ENDOSCOPY N/A 10/20/2017   Procedure: DRUG INDUCED SLEEP ENDOSCOPY;  Surgeon: Carlie Clark, MD;  Location: [facility];  Service: ENT;  Laterality: N/A;   EP IMPLANTABLE DEVICE N/A 05/13/2016   Procedure: Loop Recorder Insertion;  Surgeon: Elspeth JAYSON Sage, MD;  Location: [facility];  Service: Cardiovascular;  Laterality: N/A;   ESOPHAGOGASTRODUODENOSCOPY N/A 10/19/2021   Procedure: ESOPHAGOGASTRODUODENOSCOPY (EGD);  Surgeon: Jinny Carmine, MD;  Location: [facility] END[facility]COPY;  Service: Endoscopy;  Laterality: N/A;   ESOPHAGOGASTRODUODENOSCOPY (EGD) WITH PROPOFOL  N/A 11/30/2018   Procedure: ESOPHAGOGASTRODUODENOSCOPY (EGD) WITH PROPOFOL ;  Surgeon: Therisa Bi, MD;  Location: [facility] ENDOSCOPY;  Service: Gastroenterology;  Laterality: N/A;   ESOPHAGOGASTRODUODENOSCOPY (EGD) WITH PROPOFOL  N/A 12/01/2018   Procedure: ESOPHAGOGASTRODUODENOSCOPY (EGD) WITH PROPOFOL ;  Surgeon: Jinny Carmine, MD;  Location: [facility] ENDOSCOPY;  Service: Endoscopy;  Laterality: N/A;   HOLMIUM LASER APPLICATION Bilateral 05/20/2015   Procedure: HOLMIUM LASER APPLICATION;  Surgeon: Ricardo Alvaro, MD;  Location: [facility];  Service: Urology;  Laterality: Bilateral;   IMPLIMENTATION OF A HYPOGLOSSAL NERVE STIMULATOR  12/08/2017   [facility]    IR GASTROSTOMY TUBE MOD SED  01/18/2024   LEFT HEART CATHETERIZATION WITH CORONARY ANGIOGRAM N/A 09/06/2013   Procedure: LEFT HEART CATHETERIZATION WITH CORONARY ANGIOGRAM;  Surgeon: Victory LELON Claudene DOUGLAS, MD;  Location: [facility] CATH LAB;  Service: Cardiovascular;  Laterality: N/A;   LEFT KNEE CAP  SURGERY ABOUT 40 YRS AGO  ? 1970     STONE EXTRACTION WITH BASKET Left 03/19/2014   Procedure: STONE EXTRACTION WITH BASKET;  Surgeon: Mickey Ricardo KATHEE Alvaro, MD;  Location: [facility];  Service: Urology;  Laterality: Left;   TRANSURETHRAL RESECTION OF PROSTATE N/A 08/25/2021   Procedure: TRANSURETHRAL RESECTION OF THE  PROSTATE (TURP);  Surgeon: Alvaro Ricardo, MD;  Location: [facility];  Service: Urology;  Laterality: N/A;  1 HR   Social History:  reports that he has never smoked. He has never used smokeless tobacco. He reports that he does not currently use alcohol after a past usage of about 1.0 standard drink of alcohol per week. He reports that he does not use drugs.  Allergies  Allergen Reactions   Codeine Nausea Only and Nausea And Vomiting   Tape Other (See Comments)    Use only paper tape. Severe blistering and bruising with other tapes. No cloth tape.    Family History  Problem Relation Age of Onset   Coronary artery disease Mother    Heart attack Mother    Heart disease Mother    Hypertension Mother    Cancer Father        lung and colon   Lung cancer Father        smoker   Diabetes Brother     Prior to Admission medications   Medication Sig Start Date End Date Taking? Authorizing Provider  aspirin  EC 81 MG tablet Take 81 mg by mouth daily. 10/02/18  [provider]  atorvastatin  (LIPITOR ) 80 MG tablet TAKE 1 TABLET BY MOUTH DAILY 08/21/23   Bedsole, Amy E, MD  baclofen (LIORESAL) 10 MG tablet Take 10 mg by mouth 2 (two) times daily. 09/19/23   [provider]  buPROPion  (WELLBUTRIN  XL) 150 MG 24 hr tablet TAKE 3 TABLETS BY MOUTH DAILY 01/08/24   Bedsole, Amy E, MD  busPIRone  (BUSPAR ) 10 MG tablet TAKE 2 TABLETS BY MOUTH 3 TIMES A DAY 11/06/23   Bedsole, Amy E, MD  Cholecalciferol  (VITAMIN D3) 50 MCG (2000 UT) TABS Take 2,000 Units by mouth daily.    [provider]  clopidogrel  (PLAVIX ) 75 MG tablet TAKE 1 TABLET BY MOUTH DAILY 06/05/23   Gerard Frederick, NP  cyanocobalamin  (VITAMIN B12) 1000 MCG tablet Take 1,000 mcg by mouth daily. 10/02/18   [provider]  denosumab  (PROLIA ) 60 MG/ML SOSY injection Inject 60 mg into the skin every 6 (six) months. Do not send RX, order this from the office    [provider]  DULoxetine  (CYMBALTA ) 30 MG capsule  TAKE THREE CAPSULES BY MOUTH DAILY 10/11/23   Bedsole, Amy E, MD  Erenumab-aooe (AIMOVIG) 140 MG/ML SOAJ 140 mg every 28 (twenty-eight) days.    [provider]  finasteride  (PROSCAR ) 5 MG tablet Take 5 mg by mouth daily. 01/22/21   [provider]  HYDROcodone -acetaminophen  (NORCO/VICODIN) 5-325 MG tablet Take 1 tablet by mouth every 8 (eight) hours as needed for up to 5 days for moderate pain (pain score 4-6) or severe pain (pain score 7-10). 01/19/24 01/24/24  Bedsole, Amy E, MD  levothyroxine  (SYNTHROID ) 88 MCG tablet TAKE 1 TABLET BY MOUTH DAILY BEFORE BREAKFAST 08/28/23   Bedsole, Amy E, MD  losartan  (COZAAR ) 50 MG tablet Take 1 tablet (50 mg total) by mouth daily. 12/25/23   Gollan, Timothy J, MD  Magnesium  250 MG CAPS Take 250 mg by mouth daily.    [provider]  metoCLOPramide (REGLAN) 10 MG tablet Take 10 mg by mouth every 8 (eight) hours as needed. 12/13/23   [provider]  metoprolol  succinate (TOPROL  XL) 25 MG 24 hr tablet Take 1 tablet (25 mg total) by mouth daily. 08/09/23   Gerard Frederick, NP  nitroGLYCERIN  (NITROSTAT ) 0.4 MG SL tablet Place 1 tablet (0.4 mg total) under the tongue every 5 (five) minutes as needed for chest pain. 07/04/23   Bedsole, Amy E, MD  triamcinolone  cream (KENALOG ) 0.1 % APPLY 2 TOPICALLY DAILY , DO NOT APPLY ON DACE , GROIN AND UNDERARMS. 12/29/23   Bedsole, Amy E, MD  zolpidem  (AMBIEN ) 10 MG tablet Take 1 tablet (10 mg total) by mouth at bedtime as needed for sleep. 01/16/24 02/15/24  Avelina Greig BRAVO, MD  zonisamide  (ZONEGRAN ) 25 MG capsule Take 1 capsule (25 mg total) by mouth daily. 12/15/23   Avelina Greig BRAVO, MD    Physical Exam: Vitals:   01/22/24 0815 01/22/24 0900 01/22/24 0916 01/22/24 0916  BP:  122/69  122/69  Pulse: 83 80  82  Resp: 18 18  19   Temp:   (!) 97.5 F (36.4 C)   TempSrc:   Oral   SpO2:    97%  Weight:      Height:       Constitutional: NAD, calm, comfortable appears very thin and  malnourished. Respiratory: clear to auscultation bilaterally, no wheezing, no crackles. Normal respiratory effort. No accessory muscle use.  Room air Cardiovascular: Regular rate and rhythm, no murmurs / rubs / gallops.  No extremity edema. 2+ pedal pulses. Abdomen: no tenderness, nondistended, no masses palpated. No hepatosplenomegaly. Bowel sounds positive.  PEG tube in place left abdomen with dressing intact and no drainage or tenderness with exam. Musculoskeletal: no clubbing / cyanosis. No joint deformity upper and lower extremities. Good ROM, no contractures. Normal muscle tone.  Skin: no rashes, lesions, ulcers. No induration Neurologic: CN 2-12 grossly intact. Sensation intact, Strength 5/5 x all 4 extremities.  Psychiatric: Normal judgment and insight. Alert and oriented x 3. Normal mood.    Data Reviewed:  Sodium 138, potassium 4.9, chloride 99, glucose 102, BUN 40, creatinine 1.37, LFTs are normal, albumin 3.2, total protein 6.1  Troponin 4 x 2  Lactic acid 1.2  WBC 11,800 with elevated neutrophils 85% and immature neutrophils 9.9%, hemoglobin 11.3, MCV 104.3, platelets 204,000,  Respiratory panel negative for influenza, RSV and COVID,  Urinalysis mildly abnormal but not consistent with UTI  Blood cultures have been obtained  Single view chest x-ray unremarkable except for small amount of intraperitoneal free gas  CT head markedly motion degraded with no obvious abnormalities although subtle areas of acute ischemia or subtle/trace acute hemorrhage could be obscured-if any clinical concerns repeat imaging is recommended  CT cervical spine negative for acute traumatic injury  CT chest abdomen and pelvis revealed small to moderate amount of intraperitoneal free gas and after review by the operative physician Dr. Jenna it was determined that this is an expected amount of gas and not a finding that is concerning.  There was also interval progression of patchy airspace disease in  the right lower lobe and posterior left lower lobe concerning for multifocal pneumonia.  There is also new finding of tree-in-bud nodularity in the posterior right upper lobe which is likely infectious.  There are also scattered tiny bilateral pulmonary nodules with follow-up recommended.  There was an interval finding of moderate colonic stool volume consistent with constipation  Assessment and Plan: Multifocal pneumonia Known chronic aspiration Patient presents with acute altered mental status as well as generalized weakness with clinical findings of leukocytosis and CT positive for multifocal pneumonia and different from baseline chronic aspiration Begin Zosyn harmlessly dosing  Dysphagia Wife states patient is allowed to sip on water  at home-May need to repeat swallow study while here Apparently has been intolerant to recent initiation of bolus tube feedings with some emesis.  Unclear if this is related to constipation or intolerance to bolus feeds. Treat constipation as below Nutrition consultation for tube feeding recommendations.  Wife states he was on Nestl brand tube feeding 8 ounce boluses 6 times per day.  Given recent issues with emesis may need to transition to continuous infusion tube feedings With getting minimal volume of free water  at home as well and wife states current tube feedings did not contain fiber  Severe protein calorie malnutrition Nutrition consultation as above BMI 15 with low albumin and total protein  Constipation Patient on multiple medications at home that can contribute to constipation: Baclofen, BuSpar , Vicodin, Cymbalta  Will give 20 g Chronulac per 2 x 2 doses and monitor lower GI response  CAD/HLD Continue beta-blocker, Plavix , statin and low-dose aspirin  Currently asymptomatic  Hypertension Continue above medications under CAD as well as Cozaar   CKD stage IIIa Renal function stable and at baseline with current creatinine 1.37 and GFR  50  OSA/reported chronic hypoxia on O2 Oxygen  as needed Order CPAP if continues to use at home prior to admission  Hypothyroidism Continue Synthroid   GAD Continue BuSpar , Wellbutrin , Cymbalta   Advance Care Planning:   Code Status: Full Code   VTE prophylaxis: Lovenox   Consults: None  Family Communication: Wife at bedside  Severity of Illness: The appropriate patient status for this patient is INPATIENT. Inpatient status is judged to be reasonable and necessary in order to provide the required intensity of service to ensure the patient's safety. The patient's presenting symptoms, physical exam findings, and initial radiographic and laboratory data in the context of their chronic comorbidities is felt to place them at high risk for further clinical deterioration. Furthermore, it is not anticipated that the patient will be medically stable for discharge from the hospital within 2 midnights of admission.   * I certify that at the point of admission it is my clinical judgment that the patient will require inpatient hospital care spanning beyond 2 midnights from the point of admission due to high intensity of service, high risk for further deterioration and high frequency of surveillance required.*  Author: Isaiah Lever, NP 01/22/2024 11:43 AM  For on call review [email or web address].

## 2024-01-22 NOTE — Evaluation (Signed)
 Clinical/Bedside Swallow Evaluation Patient Details  Name: Shawn Lam MRN: [id number] Date of Birth: Jan 03, 1951  Today's Date: 01/22/2024 Time: SLP Start Time (ACUTE ONLY): 1220 SLP Stop Time (ACUTE ONLY): 1241 SLP Time Calculation (min) (ACUTE ONLY): 21 min  Past Medical History:  Past Medical History:  Diagnosis Date   Actinic keratosis 02/04/2021   right lateral neck   Anemia    Anxiety    Asymptomatic LV dysfunction    50-55% by echo 06/2016   BCC (basal cell carcinoma of skin) 08/05/2020   left neck infra auricular - nodular pattern - treated with [facility] 09/17/2020   Berger's disease    CAD (coronary artery disease) 06/2005   PCI of LAD and RCA   Depression    Dilated aortic root    4cm at sinus of Valsalva   Hematuria    Hyperlipidemia    Hypothyroidism    Insomnia    Left ureteral stone    Orthostatic hypotension    negative tilt table   OSA (obstructive sleep apnea)    moderate with AHI 17/hr, uses CPAP nightly(01/15/19 - has Inspire implant)   PVC's (premature ventricular contractions) 07/07/2016   Renal disorder    PT IS SEEING DR. BETSEY- NEPHROLOGIST   Rosacea    Urticaria    Past Surgical History:  Past Surgical History:  Procedure Laterality Date   ANTERIOR CERVICAL DECOMP/DISCECTOMY FUSION N/A 03/13/2023   Procedure: CERVICAL FOUR-FIVE, CERVICAL FIVE-SIX ANTERIOR CERVICAL DISCECTOMY/DECOMPRESSION FUSION;  Surgeon: Onetha Kuba, MD;  Location: [facility] OR;  Service: Neurosurgery;  Laterality: N/A;  3C   CARDIAC CATHETERIZATION  09/06/2013   [facility]   CARDIAC CATHETERIZATION     COLONOSCOPY WITH PROPOFOL  N/A 01/21/2019   Procedure: COLONOSCOPY WITH PROPOFOL ;  Surgeon: Jinny Carmine, MD;  Location: [facility];  Service: Endoscopy;  Laterality: N/A;  sleep apnea   COLONOSCOPY WITH PROPOFOL  N/A 10/19/2021   Procedure: COLONOSCOPY WITH PROPOFOL ;  Surgeon: Jinny Carmine, MD;  Location: [facility] ENDOSCOPY;  Service: Endoscopy;  Laterality: N/A;   CORONARY ANGIOPLASTY   2004   STENT PLACEMENT   CYSTOSCOPY WITH RETROGRADE PYELOGRAM, URETEROSCOPY AND STENT PLACEMENT Left 03/19/2014   Procedure: CYSTOSCOPY WITH RETROGRADE PYELOGRAM, URETEROSCOPY AND STENT PLACEMENT;  Surgeon: Mickey Ricardo KATHEE Alvaro, MD;  Location: [facility] ORS;  Service: Urology;  Laterality: Left;   CYSTOSCOPY WITH RETROGRADE PYELOGRAM, URETEROSCOPY AND STENT PLACEMENT Bilateral 05/20/2015   Procedure:  CYSTOSCOPY WITH BILATERAL RETROGRADE PYELOGRAM,RIGHT  DIAGNOSTIC URETEROSCOPY ,LEFT URETEROSCOPY WITH HOLMIUM LASER  AND BILATERAL  STENT PLACEMENT ;  Surgeon: Ricardo Alvaro, MD;  Location: [facility]S;  Service: Urology;  Laterality: Bilateral;   DRUG INDUCED ENDOSCOPY N/A 10/20/2017   Procedure: DRUG INDUCED SLEEP ENDOSCOPY;  Surgeon: Carlie Clark, MD;  Location: [facility];  Service: ENT;  Laterality: N/A;   EP IMPLANTABLE DEVICE N/A 05/13/2016   Procedure: Loop Recorder Insertion;  Surgeon: Elspeth JAYSON Sage, MD;  Location: [facility]VAS[facility] LAB;  Service: Cardiovascular;  Laterality: N/A;   ESOPHAGOGASTRODUODENOSCOPY N/A 10/19/2021   Procedure: ESOPHAGOGASTRODUODENOSCOPY (EGD);  Surgeon: Jinny Carmine, MD;  Location: [facility] ENDOSCOPY;  Service: Endoscopy;  Laterality: N/A;   ESOPHAGOGASTRODUODENOSCOPY (EGD) WITH PROPOFOL  N/A 11/30/2018   Procedure: ESOPHAGOGASTRODUODENOSCOPY (EGD) WITH PROPOFOL ;  Surgeon: Therisa Bi, MD;  Location: [facility] ENDOSCOPY;  Service: Gastroenterology;  Laterality: N/A;   ESOPHAGOGASTRODUODENOSCOPY (EGD) WITH PROPOFOL  N/A 12/01/2018   Procedure: ESOPHAGOGASTRODUODENOSCOPY (EGD) WITH PROPOFOL ;  Surgeon: Jinny Carmine, MD;  Location: [facility] ENDOSCOPY;  Service: Endoscopy;  Laterality: N/A;   HOLMIUM LASER APPLICATION Bilateral  05/20/2015   Procedure: HOLMIUM LASER APPLICATION;  Surgeon: Ricardo Likens, MD;  Location: [facility];  Service: Urology;  Laterality: Bilateral;   IMPLIMENTATION OF A HYPOGLOSSAL NERVE STIMULATOR  12/08/2017   OSA    IR GASTROSTOMY TUBE MOD SED  01/18/2024   LEFT HEART  CATHETERIZATION WITH CORONARY ANGIOGRAM N/A 09/06/2013   Procedure: LEFT HEART CATHETERIZATION WITH CORONARY ANGIOGRAM;  Surgeon: Victory LELON Claudene DOUGLAS, MD;  Location: [facility] CATH LAB;  Service: Cardiovascular;  Laterality: N/A;   LEFT KNEE CAP  SURGERY ABOUT 40 YRS AGO  ? 1970     STONE EXTRACTION WITH BASKET Left 03/19/2014   Procedure: STONE EXTRACTION WITH BASKET;  Surgeon: Mickey Ricardo KATHEE Likens, MD;  Location: [facility];  Service: Urology;  Laterality: Left;   TRANSURETHRAL RESECTION OF PROSTATE N/A 08/25/2021   Procedure: TRANSURETHRAL RESECTION OF THE PROSTATE (TURP);  Surgeon: Likens Ricardo, MD;  Location: [facility];  Service: Urology;  Laterality: N/A;  1 HR   HPI:  Shawn Lam is a 73 y.o. male with medical history significant of Buerger's disease, dyslipidemia, GAD, CAD, GERD, sleep apnea, hypothyroidism, CKD stage IIIa, silent aspiration in context of dysphagia, chronic hypoxemia on nasal cannula oxygen , and prior ACDF surgery.  Patient was brought to the ER by EMS after having a witnessed fall.  Prior to this he is stated he was feeling dizzy and was unable to stand any longer.  Upon presentation he was alert but confused.  Wife states that patient has been having multiple episodes of feeling dizzy and weak upon standing.  Of note patient had a PEG tube placed on 10/9 in radiology.  He had been started on bolus feedings but was not tolerating these well and over the past 24 hours has had multiple episodes of emesis.  In the ED he was noted to have an elevated white count of 11,800.  CT of the chest revealed multifocal pneumonia and an incidental finding of constipation.  Of note there was an incidental finding of possible free air but after review with the interventional radiologist who placed the PEG tube Dr. Jenna this was an expected amount of free air and it was deemed appropriate to use the PEG tube.  Viral respiratory panel is negative.    Assessment / Plan / Recommendation  Clinical Impression   Pt with history of severe pharyngeal phase dysphagia suspect d/t ACDF (03/2023). Currently receiving Outpatient ST service to target pharyngeal strengthening. Pt with associated weight loss, PEG placement on 01/18/2024,       Pt with confirmed silent aspiration on most recent MBSS (12/26/2023). Pt contiues to present pleasantly increased confusion and intermittent tangential comments. Pt's comments indicate decreased awareness of current medical situation.        Pt was consuming ice chips with this Clinical research associate entered room. He required maximal cues to implement swallow strategies of right head turn and chin tuck during consumption. His wife reports I have trouble getting him to use those strategies (referring to using right head turn while looking down when consuming PO intake.) Pt with briefly decreased O2 (89%) during consumption of ice chips without use of strategy.   At this time, recommend pt be allowed minimal ice chips with maximal cues to use compensatory swallow strategies.     RD made aware of pt's admission with request for help in establishing appropriate feedings and formula.       Addendum: This writer was made aware that pt's seizure medicane could not be crushed, recommend  giving it whole with applesauce and maximal cues for head turn to the right, chin tuck. ST services will follow. SLP Visit Diagnosis: Dysphagia, pharyngeal phase (R13.13)    Aspiration Risk  Severe aspiration risk;Risk for inadequate nutrition/hydration    Diet Recommendation NPO;NPO except meds    Liquid Administration via: Spoon Medication Administration: Via alternative means Supervision: Full supervision/cueing for compensatory strategies Compensations: Minimize environmental distractions;Slow rate;Small sips/bites Postural Changes: Seated upright at 90 degrees    Other  Recommendations Oral Care Recommendations: Oral care QID Caregiver Recommendations: Avoid jello, ice cream, thin soups, popsicles;Remove  water  pitcher     Assistance Recommended at Discharge  Supervision  Functional Status Assessment Patient has had a recent decline in their functional status and/or demonstrates limited ability to make significant improvements in function in a reasonable and predictable amount of time  Frequency and Duration min 2x/week  2 weeks       Prognosis Prognosis for improved oropharyngeal function: Fair Barriers to Reach Goals: Cognitive deficits;Time post onset;Severity of deficits (malnutrition)      Swallow Study   General Date of Onset: 01/21/24 HPI: Shawn Lam is a 73 y.o. male with medical history significant of Buerger's disease, dyslipidemia, GAD, CAD, GERD, sleep apnea, hypothyroidism, CKD stage IIIa, silent aspiration in context of dysphagia, chronic hypoxemia on nasal cannula oxygen , and prior ACDF surgery.  Patient was brought to the ER by EMS after having a witnessed fall.  Prior to this he is stated he was feeling dizzy and was unable to stand any longer.  Upon presentation he was alert but confused.  Wife states that patient has been having multiple episodes of feeling dizzy and weak upon standing.  Of note patient had a PEG tube placed on 10/9 in radiology.  He had been started on bolus feedings but was not tolerating these well and over the past 24 hours has had multiple episodes of emesis.  In the ED he was noted to have an elevated white count of 11,800.  CT of the chest revealed multifocal pneumonia and an incidental finding of constipation.  Of note there was an incidental finding of possible free air but after review with the interventional radiologist who placed the PEG tube Dr. Jenna this was an expected amount of free air and it was deemed appropriate to use the PEG tube.  Viral respiratory panel is negative. Type of Study: Bedside Swallow Evaluation Previous Swallow Assessment: Modified Barium Swallow Study Diet Prior to this Study: NPO Temperature Spikes Noted:  No Respiratory Status: Room air History of Recent Intubation: No Behavior/Cognition: Alert;Pleasant mood;Confused;Requires cueing;Distractible Oral Cavity Assessment: Within Functional Limits Oral Care Completed by SLP: No Oral Cavity - Dentition: Adequate natural dentition Vision: Functional for self-feeding Self-Feeding Abilities: Able to feed self Patient Positioning: Upright in bed Baseline Vocal Quality: Low vocal intensity;Hoarse;Breathy Volitional Cough: Weak Volitional Swallow: Able to elicit    Oral/Motor/Sensory Function Overall Oral Motor/Sensory Function: Within functional limits   Ice Chips Ice chips: Impaired Presentation: Cup (small medicine cup) Pharyngeal Phase Impairments: Suspected delayed Swallow Other Comments: maximal cues for right head turn and chin tuck as per recommendations on most recent MBSS   Thin Liquid Thin Liquid: Not tested    Nectar Thick Nectar Thick Liquid: Not tested   Honey Thick Honey Thick Liquid: Not tested   Puree Puree: Not tested   Solid     Solid: Not tested     Ajene Carchi B. Rubbie, M.S., CCC-SLP, CBIS Speech-Language Pathologist Certified Brain Injury Specialist  [facility]  [facility] Services Office [phone] Ascom [phone] Fax [phone]

## 2024-01-22 NOTE — ED Notes (Signed)
 These meds go PO with Applesauce ONLY.   Bupropion  Duloexetine Finasteride  Zonisamide   All other meds can give via tube per pharmacy.

## 2024-01-22 NOTE — Progress Notes (Signed)
 Initial Nutrition Assessment  DOCUMENTATION CODES:   Underweight  INTERVENTION:   Osmolite 1.5- Give one carton six times daily via tube. Flush tube with 50ml of water  before and after each feed. (Will begin with 1/3 carton and advance as tolerated)   Regimen provides 2130kcal/day, 90g/day protein and 1622ml/day of free water .   Pt at high refeed risk; recommend monitor potassium, magnesium  and phosphorus labs daily until stable  Thiamine 100mg  daily via tube x 7 days   Daily weights   NUTRITION DIAGNOSIS:   Inadequate oral intake related to dysphagia as evidenced by NPO status.  GOAL:   Patient will meet greater than or equal to 90% of their needs  MONITOR:   Diet advancement, Labs, Weight trends, TF tolerance, I & O's, Skin  REASON FOR ASSESSMENT:   Consult Enteral/tube feeding initiation and management  ASSESSMENT:   73 y/o male with h/o CAD s/p PCI of the LAD and RCA, Buerger's disease, HLD, anxiety, depression, GERD, OSA, CKD III, PUD, IDA, BPH, compression fracture, chronic constipation, lymphedema, kidney stones, orthostatic hypotension and dysphagia with silent aspiration s/p IR G-tube placement (100F) on 10/9 and who is now admitted with aspiration PNA, falls and constipation.  RD unable to see patient today as pt remains in the ED. Pt with noted confusion today. Spoke with patients wife via phone. Pt has been being followed by outpatient SLP for silent aspiration. Pt s/p new G-tube placement on Thursday. Wife reports that pt's home tube feed regimen is Isosource 1.5, six cartons daily along with 30ml flushes before and after each feed (provides 2250kcal/day, 102g/day protein and 1555ml/day of free water ). Patient receives supplies from [facility]. RD spoke with representative from [employer] to confirm regimen. Wife reports that she started with 1/2 carton for the first couple of feeds and then advanced to a full carton. Pt has been complaining of early satiety, bloating and  fullness and has only been receiving 2 cartons daily; wife reports that patient was able to tolerate 4 cartons yesterday but then began vomiting. Pt with moderate stool burden noted on admission and is receiving bowel regimen. RD suspects emesis was r/t the constipation and not the formula itself.   RD discussed the tube feed regimen with pt's wife. Will plan to restart tube feeds today. Will plan to start with 1/3 of a carton at each feed until patient has had a bowel movement and then will advance to 1/2 carton. RD suspects patient just needs some more time to adapt to the volume until advancing to a whole carton. Pt will receive Osmolite in the hospital as Isosource is not on formulary at [facility]; wife aware. Pt remains at high refeed risk; will add thiamine. RD suspects patient with severe malnutrition but unable to diagnose at this time. RD will obtain exam once pt admitted onto the medical floors.   Medications reviewed and include: aspirin , plavix , B12, lovenox , lactulose, Mg oxide, unasyn   Labs reviewed: K 4.9 wnl, BUN 40(H), creat 1.37(H), P 3.4 wnl, Mg 2.1 wnl  Wbc- 11.8(H)  NUTRITION - FOCUSED PHYSICAL EXAM: Unable to perform at this time   Diet Order:   Diet Order             Diet NPO time specified Except for: Ice Chips, Other (See Comments)  Diet effective now                  EDUCATION NEEDS:   Education needs have been addressed  Skin:  Skin Assessment: Reviewed  RN Assessment  Last BM:  10/13- constipation  Height:   Ht Readings from Last 1 Encounters:  01/22/24 6' (1.829 m)    Weight:   Wt Readings from Last 1 Encounters:  01/22/24 52 kg    Ideal Body Weight:  80.9 kg  BMI:  Body mass index is 15.55 kg/m.  Estimated Nutritional Needs:   Kcal:  1800-2100kcal/day  Protein:  90-105g/day  Fluid:  1.6-1.8L/day  Augustin Shams MS, RD, LDN If unable to be reached, please send secure chat to RD inpatient available from 8:00a-4:00p daily

## 2024-01-23 ENCOUNTER — Ambulatory Visit: Admitting: Speech Pathology

## 2024-01-23 ENCOUNTER — Ambulatory Visit

## 2024-01-23 ENCOUNTER — Encounter: Admitting: Physical Therapy

## 2024-01-23 ENCOUNTER — Encounter (HOSPITAL_COMMUNITY): Payer: Self-pay

## 2024-01-23 DIAGNOSIS — E43 Unspecified severe protein-calorie malnutrition: Secondary | ICD-10-CM | POA: Insufficient documentation

## 2024-01-23 DIAGNOSIS — J188 Other pneumonia, unspecified organism: Secondary | ICD-10-CM | POA: Diagnosis not present

## 2024-01-23 LAB — COMPREHENSIVE METABOLIC PANEL WITH GFR
ALT: 23 U/L (ref 0–44)
AST: 36 U/L (ref 15–41)
Albumin: 3 g/dL — ABNORMAL LOW (ref 3.5–5.0)
Alkaline Phosphatase: 42 U/L (ref 38–126)
Anion gap: 6 (ref 5–15)
BUN: 29 mg/dL — ABNORMAL HIGH (ref 8–23)
CO2: 28 mmol/L (ref 22–32)
Calcium: 8.4 mg/dL — ABNORMAL LOW (ref 8.9–10.3)
Chloride: 104 mmol/L (ref 98–111)
Creatinine, Ser: 1.19 mg/dL (ref 0.61–1.24)
GFR, Estimated: >60 mL/min (ref >60)
Glucose, Bld: 89 mg/dL (ref 70–99)
Potassium: 4.1 mmol/L (ref 3.5–5.1)
Sodium: 138 mmol/L (ref 135–145)
Total Bilirubin: 0.8 mg/dL (ref 0.0–1.2)
Total Protein: 5.4 g/dL — ABNORMAL LOW (ref 6.5–8.1)

## 2024-01-23 LAB — CBC
HCT: 31.2 % — ABNORMAL LOW (ref 39.0–52.0)
Hemoglobin: 10.3 g/dL — ABNORMAL LOW (ref 13.0–17.0)
MCH: 33.8 pg (ref 26.0–34.0)
MCHC: 33 g/dL (ref 30.0–36.0)
MCV: 102.3 fL — ABNORMAL HIGH (ref 80.0–100.0)
Platelets: 157 K/uL (ref 150–400)
RBC: 3.05 MIL/uL — ABNORMAL LOW (ref 4.22–5.81)
RDW: 13.2 % (ref 11.5–15.5)
WBC: 7.8 K/uL (ref 4.0–10.5)
nRBC: 0 % (ref 0.0–0.2)

## 2024-01-23 LAB — PHOSPHORUS: Phosphorus: 3.3 mg/dL (ref 2.5–4.6)

## 2024-01-23 LAB — MAGNESIUM: Magnesium: 2 mg/dL (ref 1.7–2.4)

## 2024-01-23 MED ORDER — ENOXAPARIN SODIUM 40 MG/0.4ML IJ SOSY
40.0000 mg | PREFILLED_SYRINGE | INTRAMUSCULAR | Status: DC
  Administered 2024-01-23: 40 mg via SUBCUTANEOUS
  Filled 2024-01-23: qty 0.4

## 2024-01-23 NOTE — Progress Notes (Signed)
 Mobility Specialist - Progress Not  Pre-mobility: , SpO2-95 %  During mobility: SpO2-88% recovered <20 sec to >94%  Post-mobility: SPO2-99%   01/23/24 1600  Mobility  Activity Ambulated with assistance;Stood at bedside;Respositioned in chair  Level of Assistance Contact guard assist, steadying assist  Assistive Device Front wheel walker  Distance Ambulated (ft) 50 ft  Range of Motion/Exercises Active  Activity Response Tolerated well  Mobility visit 1 Mobility  Mobility Specialist Start Time (ACUTE ONLY) 1540  Mobility Specialist Stop Time (ACUTE ONLY) 1630  Mobility Specialist Time Calculation (min) (ACUTE ONLY) 50 min   Pt was supine in bed with guest in the room upon entry. Pt agreed to mobility. O2 level were monitored throughout the activity as a precaution. Pt is able to get to EOB with bed features and independently. Pt is able to STS with minA CGA. Pt ambulated well. Constant cues for not taking short choppy steps were stated. Pt O2 declined to 88% but recovery break served well. Pt recovered and maintained O2 levels above 90%. After activity pt returned to the room. Pt repositioned in the recliner with needs in reach and chair alarm on.   Clem Rodes Mobility Specialist 01/23/24, 4:49 PM

## 2024-01-23 NOTE — Progress Notes (Signed)
 PROGRESS NOTE    Shawn Lam  FMW:[id number] DOB: [birth year] DOA: 01/22/2024 PCP: Avelina Greig BRAVO, MD  120A/120A-AA  LOS: 1 day   Brief hospital course:   Assessment & Plan: Shawn Lam is a 73 y.o. male with medical history significant of Buerger's disease, dyslipidemia, GAD, CAD, GERD, sleep apnea, hypothyroidism, CKD stage IIIa, silent aspiration in context of dysphagia, chronic hypoxemia on nasal cannula oxygen , and prior ACDF surgery.  Patient was brought to the ER by EMS after having a witnessed fall.  Prior to this he is stated he was feeling dizzy and was unable to stand any longer.  Upon presentation he was alert but confused.  Wife states that patient has been having multiple episodes of feeling dizzy and weak upon standing.  Of note patient had a PEG tube placed on 10/9 in radiology.  He had been started on bolus feedings but was not tolerating these well and over the past 24 hours has had multiple episodes of emesis.  In the ED he was noted to have an elevated white count of 11,800.  CT of the chest revealed multifocal pneumonia and an incidental finding of constipation.  Of note there was an incidental finding of possible free air but after review with the interventional radiologist who placed the PEG tube Dr. Jenna this was an expected amount of free air and it was deemed appropriate to use the PEG tube.    Multifocal pneumonia Known chronic aspiration Patient presents with acute altered mental status as well as generalized weakness with clinical findings of leukocytosis and CT positive for multifocal pneumonia and different from baseline chronic aspiration --started on zosyn and switched to Unasyn --cont Unasyn   Dysphagia On Tube feed --RD consulted to help titrate up the tube feed --SLP consulted for continued eval and therapy --per pharm,  Following meds need to be taken whole, via PO with Applesauce ONLY.  Bupropion  Duloexetine Finasteride  Zonisamide  All  other meds can be crushed and given via tube per pharmacy.  Severe protein calorie malnutrition --tube feed and supplements per dietician   Constipation --had BM with lactulose 20g x1   CAD/HLD --cont ASA, plavix  and statin   Hypertension --cont lopressor    CKD stage IIIa Renal function stable and at baseline with current creatinine 1.37 and GFR 50   OSA/reported chronic hypoxia on O2 --currently sating well on room air.   Hypothyroidism Continue Synthroid    GAD Continue BuSpar , Wellbutrin , Cymbalta    DVT prophylaxis: Lovenox  SQ Code Status: Full code  Family Communication: wife updated at bedside today Level of care: Med-Surg Dispo:   The patient is from: home Anticipated d/c is to: home Anticipated d/c date is: tomorrow   Subjective and Interval History:  Pt had a good BM after lactulose.  Tube feeding going well.  Pt had no respiratory issues.   Objective: Vitals:   01/23/24 0412 01/23/24 0802 01/23/24 1656 01/23/24 1929  BP:  110/67 115/76 111/72  Pulse:  75 70 67  Resp:  14 18 18   Temp:  97.7 F (36.5 C)  98 F (36.7 C)  TempSrc:    Oral  SpO2:  98% 100% 100%  Weight: 53.3 kg     Height:        Intake/Output Summary (Last 24 hours) at 01/23/2024 2102 Last data filed at 01/23/2024 1928 Gross per 24 hour  Intake 897.76 ml  Output 675 ml  Net 222.76 ml   Filed Weights   01/22/24 0507 01/23/24 9587  Weight: 52 kg 53.3 kg    Examination:   Constitutional: NAD, AAOx3 HEENT: conjunctivae and lids normal, EOMI CV: No cyanosis.   RESP: normal respiratory effort, on RA Neuro: II - XII grossly intact.   Psych: Normal mood and affect.  Appropriate judgement and reason   Data Reviewed: I have personally reviewed labs and imaging studies  Time spent: 50 minutes  Ellouise Haber, MD [facility] If 7PM-7AM, please contact night-coverage 01/23/2024, 9:02 PM

## 2024-01-23 NOTE — Progress Notes (Signed)
 Speech Language Pathology Treatment: Dysphagia  Patient Details Name: Shawn Lam MRN: [id number] DOB: [birth year] Today's Date: 01/23/2024 Time: 9151-9091 SLP Time Calculation (min) (ACUTE ONLY): 20 min  Assessment / Plan / Recommendation Clinical Impression  Pt seen for ongoing dysphagia management. Pt's wife not present during this session. Pt is pleasant, eager, decreased confusion but continue to have doubts that pt understands severity of dysphagia/aspiration risk as he requested to take all of his medicine by mouth today. Education provided to pt's nurse on giving only the medicines that cannot be crushed by mouth with applesauce, cues will be required for pt use compensatory swallow strategies. Information also updated on pt's white board in the room. Skilled observation was provided of pt consuming ice chips. With moderate verbal cues, pt able to complete compensatory swallow strategy (head turn to right with chin tuck) in 75% of opportunities. Recommend continued NPO with supervised consumption of ice chips  - pt will require verbal cues to use swallow strategies.    HPI HPI: Shawn Lam is a 73 y.o. male with medical history significant of Buerger's disease, dyslipidemia, GAD, CAD, GERD, sleep apnea, hypothyroidism, CKD stage IIIa, silent aspiration in context of dysphagia, chronic hypoxemia on nasal cannula oxygen , and prior ACDF surgery.  Patient was brought to the ER by EMS after having a witnessed fall.  Prior to this he is stated he was feeling dizzy and was unable to stand any longer.  Upon presentation he was alert but confused.  Wife states that patient has been having multiple episodes of feeling dizzy and weak upon standing.  Of note patient had a PEG tube placed on 10/9 in radiology.  He had been started on bolus feedings but was not tolerating these well and over the past 24 hours has had multiple episodes of emesis.  In the ED he was noted to have an elevated white count of  11,800.  CT of the chest revealed multifocal pneumonia and an incidental finding of constipation.  Of note there was an incidental finding of possible free air but after review with the interventional radiologist who placed the PEG tube Dr. Jenna this was an expected amount of free air and it was deemed appropriate to use the PEG tube.  Viral respiratory panel is negative.      SLP Plan  Continue with current plan of care          Recommendations  Diet recommendations: NPO (with ice chips allowed) Medication Administration: Via alternative means (pt has 4 medicine that cannot be crushed, instructions to provide them whole with applesauce, verbal cues needed for compensatory swallow strategy) Supervision: Patient able to self feed;Full supervision/cueing for compensatory strategies Compensations: Minimize environmental distractions;Slow rate;Small sips/bites (head turn to right, chin tuck) Postural Changes and/or Swallow Maneuvers: Seated upright 90 degrees;Upright 30-60 min after meal                  Oral care QID   Frequent or constant Supervision/Assistance Dysphagia, pharyngeal phase (R13.13)     Continue with current plan of care    Madine Sarr B. Rubbie, M.S., CCC-SLP, Tree surgeon Certified Brain Injury Specialist [facility]  [facility] Services Office [phone] Ascom [phone] Fax [phone]

## 2024-01-23 NOTE — Plan of Care (Signed)

## 2024-01-23 NOTE — Progress Notes (Signed)
 Mobility Specialist - Progress Note     01/23/24 1700  Mobility  Activity Ambulated with assistance;Stood at bedside;Dangled on edge of bed  Level of Assistance Contact guard assist, steadying assist  Assistive Device Front wheel walker  Distance Ambulated (ft) 20 ft  Range of Motion/Exercises Active  Activity Response Tolerated well  Mobility Referral Yes  Mobility visit 1 Mobility  Mobility Specialist Start Time (ACUTE ONLY) 1658  Mobility Specialist Stop Time (ACUTE ONLY) 1712  Mobility Specialist Time Calculation (min) (ACUTE ONLY) 14 min   Pt resting in recliner on RA upon entry. Pt STS and ambulates to bathroom CGA with RW. Pt has x2 bouts of instability and recovered with assistance. Pt returned to EOB and left with needs in reach. Chair alarm activated.   Guido Rumble Mobility Specialist 01/23/24, 5:27 PM

## 2024-01-23 NOTE — Progress Notes (Signed)
 Nutrition Follow Up Note   DOCUMENTATION CODES:   Severe malnutrition in context of social or environmental circumstances  INTERVENTION:   Osmolite 1.5- Give one carton six times daily via tube. Flush tube with 50ml of water  before and after each feed. (Will begin with 1/3 carton and advance as tolerated)   Regimen provides 2130kcal/day, 90g/day protein and 162ml/day of free water .   Pt at high refeed risk; recommend monitor potassium, magnesium  and phosphorus labs daily until stable  Thiamine 100mg  daily via tube x 7 days   Daily weights   NUTRITION DIAGNOSIS:   Severe Malnutrition related to  (dysphagia) as evidenced by severe fat depletion, severe muscle depletion. -new diagnosis   GOAL:   Patient will meet greater than or equal to 90% of their needs -progressing   MONITOR:   Diet advancement, Labs, Weight trends, TF tolerance, I & O's, Skin  ASSESSMENT:   73 y/o male with h/o CAD s/p PCI of the LAD and RCA, Buerger's disease, HLD, anxiety, depression, GERD, OSA, CKD III, PUD, IDA, BPH, compression fracture, chronic constipation, lymphedema, kidney stones, orthostatic hypotension and dysphagia with silent aspiration s/p IR G-tube placement (49F) on 10/9 and who is now admitted with aspiration PNA, falls and constipation.  Met with pt and wife in room today. Pt is in good spirits, sitting up on the side of the bed. Pt reports that he has been able to tolerate 1/2 carton of formula at a time today. Pt's wife reports that she has been doing the tube feeds in hospital and reports that she now feels more comfortable with doing it at home. Pt and wife are aware that the ultimate goal is for pt to receive 6 cartons daily; pt is not ready to try a full carton yet. Plan is for patient to advance to a full carton once he feels he is able to tolerate it. Pt is to remain NPO until SLP clears him to eat by mouth. Refeed labs within normal limits today. Pt is having bowel function now. Pt  reports chronic constipation in which he had begun taking miralax for at home. Pt is up ~3lbs from admission likely from rehydration.    Medications reviewed and include: aspirin , plavix , B12, synthroid , thiamine, Mg oxide, unasyn   Labs reviewed: K 4.1 wnl, BUN 29(H), P 3.3 wnl, Mg 2.0 wnl  Hgb 10.3(L), Hct 31.2(L)  UOP-   NUTRITION - FOCUSED PHYSICAL EXAM:  Flowsheet Row Most Recent Value  Orbital Region Severe depletion  Upper Arm Region Severe depletion  Thoracic and Lumbar Region Severe depletion  Buccal Region Severe depletion  Temple Region Severe depletion  Clavicle Bone Region Severe depletion  Clavicle and Acromion Bone Region Severe depletion  Scapular Bone Region Severe depletion  Dorsal Hand Severe depletion  Patellar Region Severe depletion  Anterior Thigh Region Severe depletion  Posterior Calf Region Severe depletion  Edema (RD Assessment) None  Hair Reviewed  Eyes Reviewed  Mouth Reviewed  Skin Reviewed  Nails Reviewed   Diet Order:   Diet Order             Diet NPO time specified Except for: Ice Chips, Other (See Comments)  Diet effective now                  EDUCATION NEEDS:   Education needs have been addressed  Skin:  Skin Assessment: Reviewed RN Assessment  Last BM:  10/13- constipation  Height:   Ht Readings from Last 1 Encounters:  01/22/24 6' (1.829 m)    Weight:   Wt Readings from Last 1 Encounters:  01/23/24 53.3 kg    Ideal Body Weight:  80.9 kg  BMI:  Body mass index is 15.94 kg/m.  Estimated Nutritional Needs:   Kcal:  1800-2100kcal/day  Protein:  90-105g/day  Fluid:  1.6-1.8L/day  Augustin Shams MS, RD, LDN If unable to be reached, please send secure chat to RD inpatient available from 8:00a-4:00p daily

## 2024-01-24 ENCOUNTER — Other Ambulatory Visit: Payer: Self-pay

## 2024-01-24 DIAGNOSIS — E43 Unspecified severe protein-calorie malnutrition: Secondary | ICD-10-CM

## 2024-01-24 DIAGNOSIS — J188 Other pneumonia, unspecified organism: Secondary | ICD-10-CM | POA: Diagnosis not present

## 2024-01-24 MED ORDER — OSMOLITE 1.5 CAL PO LIQD: 120.0000 mL | Freq: Every day | ORAL | Status: AC

## 2024-01-24 MED ORDER — LEVOTHYROXINE SODIUM 88 MCG PO TABS: 88.0000 ug | ORAL_TABLET | Freq: Every day | ORAL | Status: AC

## 2024-01-24 MED ORDER — THIAMINE HCL 100 MG PO TABS
100.0000 mg | ORAL_TABLET | Freq: Every day | ORAL | 0 refills | Status: AC
  Filled 2024-01-24: qty 4, 4d supply, fill #0

## 2024-01-24 MED ORDER — MAGNESIUM OXIDE -MG SUPPLEMENT 400 (240 MG) MG PO TABS: 400.0000 mg | ORAL_TABLET | Freq: Every day | ORAL | Status: AC

## 2024-01-24 MED ORDER — BUSPIRONE HCL 10 MG PO TABS: 20.0000 mg | ORAL_TABLET | Freq: Three times a day (TID) | ORAL | Status: DC

## 2024-01-24 MED ORDER — CYANOCOBALAMIN 1000 MCG PO TABS: 1000.0000 ug | ORAL_TABLET | Freq: Every day | ORAL | Status: DC

## 2024-01-24 MED ORDER — VITAMIN D3 50 MCG (2000 UT) PO TABS: 2000.0000 [IU] | ORAL_TABLET | Freq: Every day | ORAL | Status: AC

## 2024-01-24 MED ORDER — CLOPIDOGREL BISULFATE 75 MG PO TABS: 75.0000 mg | ORAL_TABLET | Freq: Every day | ORAL | Status: AC

## 2024-01-24 MED ORDER — ATORVASTATIN CALCIUM 80 MG PO TABS: 80.0000 mg | ORAL_TABLET | Freq: Every day | ORAL | Status: DC

## 2024-01-24 MED ORDER — FREE WATER: 100.0000 mL | Freq: Every day | Status: AC

## 2024-01-24 MED ORDER — METOPROLOL TARTRATE 25 MG/10 ML ORAL SUSPENSION: 10.0000 mg | Freq: Two times a day (BID) | ORAL | Status: DC

## 2024-01-24 MED ORDER — AMOXICILLIN-POT CLAVULANATE 400-57 MG/5ML PO SUSR
11.0000 mL | Freq: Two times a day (BID) | ORAL | 0 refills | Status: AC
  Filled 2024-01-24: qty 100, 5d supply, fill #0

## 2024-01-24 MED ORDER — BUPROPION HCL ER (XL) 150 MG PO TB24: 300.0000 mg | ORAL_TABLET | Freq: Every day | ORAL | Status: DC

## 2024-01-24 MED ORDER — BACLOFEN 10 MG PO TABS: 10.0000 mg | ORAL_TABLET | Freq: Two times a day (BID) | ORAL | Status: DC

## 2024-01-24 MED ORDER — ASPIRIN 81 MG PO CHEW: 81.0000 mg | CHEWABLE_TABLET | Freq: Every day | ORAL | Status: AC

## 2024-01-24 NOTE — Plan of Care (Signed)

## 2024-01-24 NOTE — Hospital Course (Signed)
 Shawn Lam is a 73 year old male with Buerger's disease, dyslipidemia, CAD, generalized anxiety disorder, GERD, sleep apnea, hypothyroidism, CKD stage IIIa, chronic dysphagia complicated by aspiration events, chronic respiratory failure on 2 L O2.  He presented on this admission after a witnessed fall with associated confusion.  Patient had PEG tube placed in radiology 10/9 and was started on bolus feeds but has not been tolerating well and began having multiple episodes of emesis at home.  CT of the chest on arrival showed multifocal pneumonia and possible free air.  When reviewed further with interventional radiology team it was determined this was an appropriate amount of free air secondary to recent PEG tube placement.  Patient was initiated on antibiotic therapy for pneumonia and improved significantly.  Return to his mental status baseline.  He began tolerating tube feeds well with the assistance of SLP and RD.  He remains severely malnourished with generalized weakness.  We discussed possibility of a rehab stay but ultimately he elected to discharge home with home health.  On day of discharge care plan was discussed extensively with the patient, and his wife at bedside.  Home health, DME, medications were all ordered and arranged prior to DC.  Multifocal pneumonia secondary to aspiration - CT positive for multifocal pneumonia - Initially started on Zosyn which was switched to Unasyn.  Augmentin for an additional 4 days at DC  Chronic dysphagia PEG tube - PEG tube placed 10/9 outpatient radiology - RD and SLP consulted while patient was admitted. - Tube feeds titrated. - Patient is now tolerating tube feeds well - His wife reports they have all equipment at home.  She has received bedside teaching. - Most medications have been switched to per tube with the exception of: Bupropion , duloxetine , finasteride , zonisamide .  These medications need to be taken at home via p.o. with applesauce only as  discussed with pharmacy.  Severe protein calorie malnutrition - BMI 15 - RD consulted - On tube feeds as above - Vitamin supplementation ordered outpatient  Constipation - Resolved with lactulose  CAD HLD - Continue home meds aspirin , Plavix , statin  Hypertension - Continue Lopressor   CKD stage III AA - Creatinine currently appears improved beyond baseline - Continue outpatient monitoring with primary care team  Chronic respiratory failure - Chronically on 2 L O2 at home

## 2024-01-24 NOTE — Progress Notes (Signed)
 Mobility Specialist Progress Note:    01/24/24 1119  Mobility  Activity Ambulated with assistance;Pivoted/transferred from bed to chair  Level of Assistance Minimal assist, patient does 75% or more  Assistive Device Front wheel walker  Distance Ambulated (ft) 90 ft  Range of Motion/Exercises Active;All extremities  Activity Response Tolerated well  Mobility visit 1 Mobility  Mobility Specialist Start Time (ACUTE ONLY) 1057  Mobility Specialist Stop Time (ACUTE ONLY) 1119  Mobility Specialist Time Calculation (min) (ACUTE ONLY) 22 min   Pt received in bed, agreeable to mobility. Required MinA to stand and CGA to ambulate with RW. Tolerated well, c/o SOB near EOS. SpO2 98% on RA before and after mobility. Returned to room, left pt in chair. Alarm on and belongings in reach, wife at bedside. All needs met.  Sherrilee Ditty Mobility Specialist Please contact via Special educational needs teacher or  Rehab office at [phone]

## 2024-01-24 NOTE — Telephone Encounter (Signed)
 Spoke with Mr. Lebleu.  He states this company called him and he told them he was not interested.  He felt like it was a scam.  Faxed back paperwork denying request.

## 2024-01-24 NOTE — Plan of Care (Signed)
  Problem: Education: Goal: Knowledge of General Education information will improve Description: Including pain rating scale, medication(s)/side effects and non-pharmacologic comfort measures 01/24/2024 1440 by Joesph Donnell LABOR, RN Outcome: Adequate for Discharge 01/24/2024 1440 by Joesph Donnell LABOR, RN Outcome: Progressing   Problem: Health Behavior/Discharge Planning: Goal: Ability to manage health-related needs will improve 01/24/2024 1440 by Joesph Donnell LABOR, RN Outcome: Adequate for Discharge 01/24/2024 1440 by Joesph Donnell LABOR, RN Outcome: Progressing   Problem: Clinical Measurements: Goal: Ability to maintain clinical measurements within normal limits will improve 01/24/2024 1440 by Joesph Donnell LABOR, RN Outcome: Adequate for Discharge 01/24/2024 1440 by Joesph Donnell LABOR, RN Outcome: Progressing Goal: Will remain free from infection 01/24/2024 1440 by Joesph Donnell LABOR, RN Outcome: Adequate for Discharge 01/24/2024 1440 by Joesph Donnell LABOR, RN Outcome: Progressing Goal: Diagnostic test results will improve 01/24/2024 1440 by Joesph Donnell LABOR, RN Outcome: Adequate for Discharge 01/24/2024 1440 by Joesph Donnell LABOR, RN Outcome: Progressing Goal: Respiratory complications will improve 01/24/2024 1440 by Joesph Donnell LABOR, RN Outcome: Adequate for Discharge 01/24/2024 1440 by Joesph Donnell LABOR, RN Outcome: Progressing Goal: Cardiovascular complication will be avoided 01/24/2024 1440 by Joesph Donnell LABOR, RN Outcome: Adequate for Discharge 01/24/2024 1440 by Joesph Donnell LABOR, RN Outcome: Progressing   Problem: Activity: Goal: Risk for activity intolerance will decrease 01/24/2024 1440 by Joesph Donnell LABOR, RN Outcome: Adequate for Discharge 01/24/2024 1440 by Joesph Donnell LABOR, RN Outcome: Progressing   Problem: Nutrition: Goal: Adequate nutrition will be maintained 01/24/2024 1440 by Joesph Donnell LABOR, RN Outcome: Adequate for  Discharge 01/24/2024 1440 by Joesph Donnell LABOR, RN Outcome: Progressing   Problem: Coping: Goal: Level of anxiety will decrease 01/24/2024 1440 by Joesph Donnell LABOR, RN Outcome: Adequate for Discharge 01/24/2024 1440 by Joesph Donnell LABOR, RN Outcome: Progressing   Problem: Elimination: Goal: Will not experience complications related to bowel motility 01/24/2024 1440 by Joesph Donnell LABOR, RN Outcome: Adequate for Discharge 01/24/2024 1440 by Joesph Donnell LABOR, RN Outcome: Progressing Goal: Will not experience complications related to urinary retention 01/24/2024 1440 by Joesph Donnell LABOR, RN Outcome: Adequate for Discharge 01/24/2024 1440 by Joesph Donnell LABOR, RN Outcome: Progressing   Problem: Pain Managment: Goal: General experience of comfort will improve and/or be controlled 01/24/2024 1440 by Joesph Donnell LABOR, RN Outcome: Adequate for Discharge 01/24/2024 1440 by Joesph Donnell LABOR, RN Outcome: Progressing   Problem: Safety: Goal: Ability to remain free from injury will improve 01/24/2024 1440 by Joesph Donnell LABOR, RN Outcome: Adequate for Discharge 01/24/2024 1440 by Joesph Donnell LABOR, RN Outcome: Progressing   Problem: Skin Integrity: Goal: Risk for impaired skin integrity will decrease 01/24/2024 1440 by Joesph Donnell LABOR, RN Outcome: Adequate for Discharge 01/24/2024 1440 by Joesph Donnell LABOR, RN Outcome: Progressing

## 2024-01-24 NOTE — Discharge Summary (Addendum)
 [address]    Shawn Lam FMW:[id number] DOB: [birth year] DOA: 01/22/2024  PCP: Avelina Greig BRAVO, MD  Admit date: 01/22/2024 Discharge date: 01/24/2024   Recommendations for Outpatient Follow-up:  Follow up with PCP in 1-2 weeks to continue chronic condition management   Hospital Course: [name] is a 73 year old male with Buerger's disease, dyslipidemia, CAD, generalized anxiety disorder, GERD, sleep apnea, hypothyroidism, CKD stage IIIa, chronic dysphagia complicated by aspiration events, chronic respiratory failure on 2 L O2.  He presented on this admission after a witnessed fall with associated confusion.  Patient had PEG tube placed in radiology 10/9 and was started on bolus feeds but has not been tolerating well and began having multiple episodes of emesis at home.  CT of the chest on arrival showed multifocal pneumonia and possible free air.  When reviewed further with interventional radiology team it was determined this was an appropriate amount of free air secondary to recent PEG tube placement.  Patient was initiated on antibiotic therapy for pneumonia and improved significantly.  Return to his mental status baseline.  He began tolerating tube feeds well with the assistance of SLP and RD.  He remains severely malnourished with generalized weakness.  We discussed possibility of a rehab stay but ultimately he elected to discharge home with home health.  On day of discharge care plan was discussed extensively with the patient, and his wife at bedside.  Home health, DME, medications were all ordered and arranged prior to DC.  Multifocal pneumonia secondary to aspiration - CT positive for multifocal pneumonia - Initially started on Zosyn which was switched to Unasyn.  Augmentin for an additional 4 days at DC  Chronic dysphagia PEG tube - PEG tube placed 10/9 outpatient radiology - RD and SLP consulted while patient was admitted. - Tube feeds titrated. - Patient is now  tolerating tube feeds well - His wife reports they have all equipment at home.  She has received bedside teaching. - Most medications have been switched to per tube with the exception of: Bupropion , duloxetine , finasteride , zonisamide .  These medications need to be taken at home via p.o. with applesauce only as discussed with pharmacy.  Severe protein calorie malnutrition - BMI 15 - RD consulted - On tube feeds as above - Vitamin supplementation ordered outpatient  Constipation - Resolved with lactulose  CAD HLD - Continue home meds aspirin , Plavix , statin  Hypertension - Continue Lopressor   CKD stage III AA - Creatinine currently appears improved beyond baseline - Continue outpatient monitoring with primary care team  Chronic respiratory failure - Chronically on 2 L O2 at home   Discharge Instructions  Discharge Instructions     Call MD for:  difficulty breathing, headache or visual disturbances   Complete by: As directed    Call MD for:  persistant dizziness or light-headedness   Complete by: As directed    Call MD for:  persistant nausea and vomiting   Complete by: As directed    Call MD for:  severe uncontrolled pain   Complete by: As directed    Call MD for:  temperature >100.4   Complete by: As directed    Diet general   Complete by: As directed    Discharge instructions   Complete by: As directed    While admitted we made some changes to your blood pressure medications. Please review your discharge medications closely to ensure you are taking the correct meds at home. Please take your blood pressure once a day and  keep a log. See your primary care doctor in one week to review this log and make further medication changes.   Increase activity slowly   Complete by: As directed       Allergies as of 01/24/2024       Reactions   Codeine Nausea Only, Nausea And Vomiting   Tape Other (See Comments)   Use only paper tape. Severe blistering and bruising with  other tapes. No cloth tape.        Medication List     STOP taking these medications    aspirin  EC 81 MG tablet Replaced by: aspirin  81 MG chewable tablet   losartan  50 MG tablet Commonly known as: COZAAR    Magnesium  250 MG Caps Replaced by: magnesium  oxide 400 (240 Mg) MG tablet   metoCLOPramide 10 MG tablet Commonly known as: REGLAN   metoprolol  succinate 25 MG 24 hr tablet Commonly known as: Toprol  XL       TAKE these medications    Aimovig 140 MG/ML Soaj Generic drug: Erenumab-aooe 140 mg every 28 (twenty-eight) days.   amoxicillin -clavulanate 400-57 MG/5ML suspension Commonly known as: AUGMENTIN Place 11 mLs into feeding tube 2 (two) times daily for 4 days.   aspirin  81 MG chewable tablet Place 1 tablet (81 mg total) into feeding tube daily. Start taking on: January 25, 2024 Replaces: aspirin  EC 81 MG tablet   atorvastatin  80 MG tablet Commonly known as: LIPITOR  Place 1 tablet (80 mg total) into feeding tube daily. Start taking on: January 25, 2024 What changed: how to take this   baclofen 10 MG tablet Commonly known as: LIORESAL Place 1 tablet (10 mg total) into feeding tube 2 (two) times daily. What changed: how to take this   buPROPion  150 MG 24 hr tablet Commonly known as: WELLBUTRIN  XL Take 2 tablets (300 mg total) by mouth daily.   busPIRone  10 MG tablet Commonly known as: BUSPAR  Place 2 tablets (20 mg total) into feeding tube 3 (three) times daily. What changed: how to take this   clopidogrel  75 MG tablet Commonly known as: PLAVIX  Place 1 tablet (75 mg total) into feeding tube daily. Start taking on: January 25, 2024 What changed: how to take this   cyanocobalamin  1000 MCG tablet Place 1 tablet (1,000 mcg total) into feeding tube daily. Start taking on: January 25, 2024 What changed: how to take this   denosumab  60 MG/ML Sosy injection Commonly known as: PROLIA  Inject 60 mg into the skin every 6 (six) months. Do not send RX,  order this from the office   DULoxetine  30 MG capsule Commonly known as: CYMBALTA  TAKE THREE CAPSULES BY MOUTH DAILY   feeding supplement (OSMOLITE 1.5 CAL) Liqd Place 120 mLs into feeding tube 6 (six) times daily.   finasteride  5 MG tablet Commonly known as: PROSCAR  Take 5 mg by mouth daily.   free water  Soln Place 100 mLs into feeding tube 6 (six) times daily.   HYDROcodone -acetaminophen  5-325 MG tablet Commonly known as: NORCO/VICODIN Take 1 tablet by mouth every 8 (eight) hours as needed for up to 5 days for moderate pain (pain score 4-6) or severe pain (pain score 7-10).   levothyroxine  88 MCG tablet Commonly known as: SYNTHROID  Place 1 tablet (88 mcg total) into feeding tube daily before breakfast. Start taking on: January 25, 2024 What changed: how to take this   magnesium  oxide 400 (240 Mg) MG tablet Commonly known as: MAG-OX Place 1 tablet (400 mg total) into feeding tube daily.  Start taking on: January 25, 2024 Replaces: Magnesium  250 MG Caps   metoprolol  tartrate 25 mg/10 mL Susp Commonly known as: LOPRESSOR  Place 4 mLs (10 mg total) into feeding tube 2 (two) times daily.   nitroGLYCERIN  0.4 MG SL tablet Commonly known as: Nitrostat  Place 1 tablet (0.4 mg total) under the tongue every 5 (five) minutes as needed for chest pain.   thiamine 100 MG tablet Commonly known as: VITAMIN B1 Place 1 tablet (100 mg total) into feeding tube daily for 4 days. Start taking on: January 25, 2024   triamcinolone  cream 0.1 % Commonly known as: KENALOG  APPLY 2 TOPICALLY DAILY , DO NOT APPLY ON DACE , GROIN AND UNDERARMS.   Vitamin D3 50 MCG (2000 UT) Tabs Place 1 tablet (2,000 Units total) into feeding tube daily. What changed: how to take this   zolpidem  10 MG tablet Commonly known as: AMBIEN  Take 1 tablet (10 mg total) by mouth at bedtime as needed for sleep.   zonisamide  25 MG capsule Commonly known as: Zonegran  Take 1 capsule (25 mg total) by mouth daily.         Contact information for follow-up providers     Avelina Greig BRAVO, MD Follow up.   Specialty: Family Medicine Why: hospital follow up Contact information: [address] [place] [address] [phone]              Contact information for after-discharge care     Home Medical Care     [facility]) .   Service: Home Health Services Contact information: [address] [place]  [phone]                    Allergies  Allergen Reactions   Codeine Nausea Only and Nausea And Vomiting   Tape Other (See Comments)    Use only paper tape. Severe blistering and bruising with other tapes. No cloth tape.    Consultations:    Procedures/Studies: CT CHEST ABDOMEN PELVIS W CONTRAST Result Date: 01/22/2024 CLINICAL DATA:  Recent PEG tube placement.  Vomiting.  Confusion. EXAM: CT CHEST, ABDOMEN, AND PELVIS WITH CONTRAST TECHNIQUE: Multidetector CT imaging of the chest, abdomen and pelvis was performed following the standard protocol during bolus administration of intravenous contrast. RADIATION DOSE REDUCTION: This exam was performed according to the departmental dose-optimization program which includes automated exposure control, adjustment of the mA and/or kV according to patient size and/or use of iterative reconstruction technique. CONTRAST:  OMNIPAQUE  IOHEXOL  300 MG/ML  SOLN COMPARISON:  CT abdomen and pelvis 01/11/2024. chest CT 11/08/2023 FINDINGS: CT CHEST FINDINGS Cardiovascular: The heart size is normal. No substantial pericardial effusion. Coronary artery calcification is evident. Mild atherosclerotic calcification is noted in the wall of the thoracic aorta. Mediastinum/Nodes: No mediastinal lymphadenopathy. There is no hilar lymphadenopathy. The esophagus has normal imaging features. There is no axillary lymphadenopathy. Lungs/Pleura: Biapical pleuroparenchymal scarring evident. Tree-in-bud  nodularity posterior right upper lobe (63/5) is new in the interval since prior study. Interval progression of patchy airspace disease in the right lower lobe and posterior left lower lobe. Scattered tiny pulmonary nodules evident. No pulmonary edema. No substantial pleural effusion. Mucus or secretions noted in the trachea. Musculoskeletal: No worrisome lytic or sclerotic osseous abnormality. Compression deformity noted at T7, T8, and T9. Status post vertebral augmentation at T12. CT ABDOMEN PELVIS FINDINGS Hepatobiliary: No suspicious focal abnormality within the liver parenchyma. There is no evidence for gallstones, gallbladder wall  thickening, or pericholecystic fluid. No intrahepatic or extrahepatic biliary dilation. Pancreas: No focal mass lesion. No dilatation of the main duct. No intraparenchymal cyst. No peripancreatic edema. Spleen: No splenomegaly. No suspicious focal mass lesion. Adrenals/Urinary Tract: No adrenal nodule or mass. Kidneys unremarkable. No evidence for hydroureter. The urinary bladder appears normal for the degree of distention. Stomach/Bowel: Stomach is decompressed. The balloon of the gastrostomy tube does appear to be within the lumen of the distal stomach although this is difficult to assess given the collapsed state of the stomach. Duodenum is normally positioned as is the ligament of Treitz. No small bowel wall thickening. No small bowel dilatation. Moderate colonic stool volume. Vascular/Lymphatic: There is mild atherosclerotic calcification of the abdominal aorta without aneurysm. There is no gastrohepatic or hepatoduodenal ligament lymphadenopathy. No retroperitoneal or mesenteric lymphadenopathy. No pelvic sidewall lymphadenopathy. Reproductive: Prostatectomy. Other: Small to moderate volume intraperitoneal free gas evident. No substantial intraperitoneal free fluid. Musculoskeletal: No worrisome lytic or sclerotic osseous abnormality. Compression deformity noted at L1 and L2.  IMPRESSION: 1. Small to moderate volume intraperitoneal free gas. This is not necessarily unexpected given gastrostomy tube placement 4 days ago. The balloon of the gastrostomy tube does appear to be within the lumen of the distal stomach although this is somewhat difficult to assess given the collapsed state of the stomach. If there is clinical concern for displacement of the G-tube, injection of contrast under fluoro or x-ray of the stomach after contrast injection could be used to confirm intraluminal tip placement. 2. Interval progression of patchy airspace disease in the right lower lobe and posterior left lower lobe. Imaging features are compatible with multifocal pneumonia. 3. Tree-in-bud nodularity posterior right upper lobe is new in the interval since prior study and likely infectious/inflammatory. 4. Scattered tiny bilateral pulmonary nodules. Attention on follow-up recommended. 5. Moderate colonic stool volume. Imaging features could be compatible with clinical constipation. 6.  Aortic Atherosclerosis (ICD10-I70.0). Electronically Signed   By: Camellia Candle M.D.   On: 01/22/2024 07:51   CT HEAD WO CONTRAST ( ) Result Date: 01/22/2024 CLINICAL DATA:  Unwitnessed fall. EXAM: CT HEAD WITHOUT CONTRAST CT CERVICAL SPINE WITHOUT CONTRAST TECHNIQUE: Multidetector CT imaging of the head and cervical spine was performed following the standard protocol without intravenous contrast. Multiplanar CT image reconstructions of the cervical spine were also generated. RADIATION DOSE REDUCTION: This exam was performed according to the departmental dose-optimization program which includes automated exposure control, adjustment of the mA and/or kV according to patient size and/or use of iterative reconstruction technique. COMPARISON:  None Available. 04/25/2022 FINDINGS: CT HEAD FINDINGS Brain: Markedly motion degraded exam obscures fine detail despite repeat scanning. Subtle areas of acute ischemia or subtle/trace  acute hemorrhage could be obscured. Within this limitation, no evidence for mass effect, substantial abnormal extra-axial fluid collection, hydrocephalus, or large volume acute infarct. Vascular: No hyperdense vessel or unexpected calcification. Skull: No evidence for fracture. No worrisome lytic or sclerotic lesion. Sinuses/Orbits: The visualized paranasal sinuses and mastoid air cells are clear. Visualized portions of the globes and intraorbital fat are unremarkable. Other: None. CT CERVICAL SPINE FINDINGS Alignment: Normal. Skull base and vertebrae: No acute fracture. No primary bone lesion or focal pathologic process. Soft tissues and spinal canal: No prevertebral fluid or swelling. No visible canal hematoma. Disc levels: Status post anterior cervical discectomy with plating at C4-5 and C5-6. Loss of disc height noted C6-7. Upper chest: See dedicated CT chest performed at the same time with report dictated separately. Other: None. IMPRESSION: 1. Markedly motion  degraded CT head exam obscures fine detail despite repeat scanning. Subtle areas of acute ischemia or subtle/trace acute hemorrhage could be obscured. Within this limitation, no evidence for mass effect, substantial abnormal extra-axial fluid collection, hydrocephalus, or large volume acute infarct. Repeat imaging recommended when patient is better able to participate. 2. No evidence for cervical spine fracture or traumatic subluxation. 3. Status post anterior cervical discectomy with plating at C4-5 and C5-6. Electronically Signed   By: Camellia Candle M.D.   On: 01/22/2024 07:42   CT Cervical Spine Wo Contrast Result Date: 01/22/2024 CLINICAL DATA:  Unwitnessed fall. EXAM: CT HEAD WITHOUT CONTRAST CT CERVICAL SPINE WITHOUT CONTRAST TECHNIQUE: Multidetector CT imaging of the head and cervical spine was performed following the standard protocol without intravenous contrast. Multiplanar CT image reconstructions of the cervical spine were also generated.  RADIATION DOSE REDUCTION: This exam was performed according to the departmental dose-optimization program which includes automated exposure control, adjustment of the mA and/or kV according to patient size and/or use of iterative reconstruction technique. COMPARISON:  None Available. 04/25/2022 FINDINGS: CT HEAD FINDINGS Brain: Markedly motion degraded exam obscures fine detail despite repeat scanning. Subtle areas of acute ischemia or subtle/trace acute hemorrhage could be obscured. Within this limitation, no evidence for mass effect, substantial abnormal extra-axial fluid collection, hydrocephalus, or large volume acute infarct. Vascular: No hyperdense vessel or unexpected calcification. Skull: No evidence for fracture. No worrisome lytic or sclerotic lesion. Sinuses/Orbits: The visualized paranasal sinuses and mastoid air cells are clear. Visualized portions of the globes and intraorbital fat are unremarkable. Other: None. CT CERVICAL SPINE FINDINGS Alignment: Normal. Skull base and vertebrae: No acute fracture. No primary bone lesion or focal pathologic process. Soft tissues and spinal canal: No prevertebral fluid or swelling. No visible canal hematoma. Disc levels: Status post anterior cervical discectomy with plating at C4-5 and C5-6. Loss of disc height noted C6-7. Upper chest: See dedicated CT chest performed at the same time with report dictated separately. Other: None. IMPRESSION: 1. Markedly motion degraded CT head exam obscures fine detail despite repeat scanning. Subtle areas of acute ischemia or subtle/trace acute hemorrhage could be obscured. Within this limitation, no evidence for mass effect, substantial abnormal extra-axial fluid collection, hydrocephalus, or large volume acute infarct. Repeat imaging recommended when patient is better able to participate. 2. No evidence for cervical spine fracture or traumatic subluxation. 3. Status post anterior cervical discectomy with plating at C4-5 and C5-6.  Electronically Signed   By: Camellia Candle M.D.   On: 01/22/2024 07:42   DG Chest Portable 1 View Result Date: 01/22/2024 CLINICAL DATA:  Fall. EXAM: PORTABLE CHEST 1 VIEW COMPARISON:  04/25/2022 FINDINGS: The lungs are clear without focal pneumonia, edema, pneumothorax or pleural effusion. The cardiopericardial silhouette is within normal limits for size. No acute bony abnormality. Battery pack for stimulator device overlies the right chest as before. Loop recorder noted medial lower left chest. No acute bony abnormality. Telemetry leads overlie the chest. There is lucency under both hemidiaphragms compatible with intraperitoneal free gas. IMPRESSION: Imaging features compatible with intraperitoneal free gas. Given the history of G-tube placement 4 days ago, this may be procedure related. Correlate clinically and CT abdomen/pelvis with contrast would be helpful to further evaluate as clinically warranted. I discussed these findings by telephone with Dr. Claudene at approximately 0559 hours on 01/22/2024. Electronically Signed   By: Camellia Candle M.D.   On: 01/22/2024 06:00   IR GASTROSTOMY TUBE MOD SED Result Date: 01/19/2024 INDICATION: Dysphagia aspiration. Planned placement  of a percutaneous gastrostomy tube for nutritional support. EXAM: Percutaneous gastrostomy placed MEDICATIONS: Glucagon 1 mg IV ANESTHESIA/SEDATION: Moderate (conscious) sedation was employed during this procedure. A total of Versed  1.5 mg and Fentanyl  75 mcg was administered intravenously by the radiology nurse. Total intra-service moderate Sedation Time: 37 minutes. The patient's level of consciousness and vital signs were monitored continuously by radiology nursing throughout the procedure under my direct supervision. CONTRAST:  80 mL Omnipaque  3-administered into the gastric lumen. FLUOROSCOPY: Radiation Exposure Index (as provided by the fluoroscopic device): 6 mGy Kerma COMPLICATIONS: None immediate. PROCEDURE: Informed written  consent was obtained from the patient after a thorough discussion of the procedural risks, benefits and alternatives. All questions were addressed. Maximal Sterile Barrier Technique was utilized including caps, mask, sterile gowns, sterile gloves, sterile drape, hand hygiene and skin antiseptic. A timeout was performed prior to the initiation of the procedure. Supine position, epigastric region was evaluated with fluoroscopy ultrasound. Ultrasound was used to demarcate the lateral margin marked the patient's skin. Fluoroscopy was used to determine and structures. Nasogastric tube was placed to insufflate the stomach room. The stomach was inflated. Local anesthesia was achieved with 1% lidocaine  each gastropexy suture placement well as the G-tube placement. Two gastropexy sutures were deployed in sequential fashion by advancing needle through a small incision until needle tip was identified in fluoroscopy to be within the stomach lumen. Intraluminal position was verified by withdrawing air into the syringe and tripping contrast onto the dorsal stomach. After the placement of 2 gastropexy sutures tension was applied to the sutures and a larger incision was then created. Yueh needle was then advanced under fluoroscopy until it was intraluminal within the stomach and again intraluminal position was verified by withdrawing air into a syringe and tripping contrast onto the dorsal wall of the stomach. Access was exchanged for an Amplatz wire. A 8 mm x 10 cm balloon was then advanced over the guidewire and inflated to dilate the tract. The 59 French gastrostomy tube was then advanced into the stomach and the balloon was inflated with 10 mL of normal saline. Balloon and guidewire were removed. Contrast was injected into the gastrostomy tube and then flushed with normal saline under fluoroscopy demonstrating position. Hemostasis achieved. Gastropexy sutures were locked in position. Sterile dressing applied. IMPRESSION:  Satisfactory placement of an 52 French gastrostomy tube and 2 gastropexy sutures. Electronically Signed   By: Cordella Banner   On: 01/19/2024 08:52   CT ABDOMEN WO CONTRAST Result Date: 01/11/2024 CLINICAL DATA:  Unintentional weight loss, malnutrition, dysphagia. EXAM: CT ABDOMEN WITHOUT CONTRAST TECHNIQUE: Multidetector CT imaging of the abdomen was performed following the standard protocol without IV contrast. RADIATION DOSE REDUCTION: This exam was performed according to the departmental dose-optimization program which includes automated exposure control, adjustment of the mA and/or kV according to patient size and/or use of iterative reconstruction technique. COMPARISON:  04/25/2022 FINDINGS: Lower chest: Patchy scarring/atelectasis at both lung bases. No overt consolidation or pleural fluid. Hepatobiliary: No focal liver abnormality is seen. No gallstones, gallbladder wall thickening, or biliary dilatation. Pancreas: Unremarkable. No pancreatic ductal dilatation or surrounding inflammatory changes. Spleen: Normal in size without focal abnormality. Adrenals/Urinary Tract: No adrenal masses. Both kidneys demonstrate punctate nonobstructing calculi without hydronephrosis or lesion. Stomach/Bowel: Moderate fecal material in the visualized colon. There is some fluid in the stomach. No small bowel dilatation. No free intraperitoneal air. Vascular/Lymphatic: No significant vascular findings are present. No enlarged abdominal lymph nodes. Other: No abdominal wall hernia, ascites or focal fluid  collections. Musculoskeletal: Stable compression deformity at T12 with prior vertebral augmentation. Stable minimal loss of height involving the superior endplate of L2. IMPRESSION: 1. No acute findings in the abdomen. 2. Punctate nonobstructing bilateral renal calculi. 3. Stable compression deformity at T12 with prior vertebral augmentation. Stable minimal loss of height involving the superior endplate of L2.  Electronically Signed   By: Marcey Moan M.D.   On: 01/11/2024 11:50   DG SWALLOW FUNC OP MEDICARE SPEECH PATH Result Date: 12/26/2023 CLINICAL DATA:  73 year old male with history of C4-C6 ACDF (03/2023) and Inspire device implantation with globus sensation and dysphagia to solids (especially pills). EXAM: MODIFIED BARIUM SWALLOW TECHNIQUE: Different consistencies of barium were administered orally to the patient by the Speech Pathologist. Imaging of the pharynx was performed in the lateral projection. Carlin Griffon, PA-C was present in the fluoroscopy room during this study, which was supervised and interpreted by Dr. Manford Cummins. Radiologist was not present in the fluoroscopy room. FLUOROSCOPY: Radiation Exposure Index (as provided by the fluoroscopic device): 5.60 mGy Kerma COMPARISON:  DG cervical spine one view on 03/13/2023; MR cervical spine without contrast on 08/22/2022; CT cervical spine without contrast on 04/25/2022. FINDINGS: Vestibular Penetration: Noted with all consistencies from overflow of residue in the distal pharynx in the vallecula and pyriform sinuses. Aspiration: Silent aspiration seen inconsistently with thin liquids. Other: ACDF fixation hardware noted at C4 through C6. Inspire device wire noted projecting over the trachea. The visualized upper esophagus appears uniformly distensible. IMPRESSION: Findings of overflow penetration and silent aspiration of thin liquids. Please refer to the Speech Pathologists report for complete details and recommendations. Electronically Signed   By: Limin  Xu M.D.   On: 12/26/2023 16:33      Discharge Exam: Vitals:   01/24/24 0333 01/24/24 0757  BP: 117/76 131/75  Pulse: 77 70  Resp:  16  Temp: (!) 97.5 F (36.4 C) 97.9 F (36.6 C)  SpO2: 98% 100%   Vitals:   01/23/24 1929 01/24/24 0333 01/24/24 0500 01/24/24 0757  BP: 111/72 117/76  131/75  Pulse: 67 77  70  Resp: 18   16  Temp: 98 F (36.7 C) (!) 97.5 F (36.4 C)  97.9 F (36.6  C)  TempSrc: Oral     SpO2: 100% 98%  100%  Weight:   53.1 kg   Height:        Constitutional:  Normal appearance. Non toxic-appearing.  HENT: Head Normocephalic and atraumatic.  Mucous membranes are moist.  Eyes:  Extraocular intact. Conjunctivae normal.  Cardiovascular: Rate and Rhythm: Normal rate and regular rhythm.  Pulmonary: Non labored, symmetric rise of chest wall. Abdomen: PEG tube in place, some dried blood around PEG tube insertion site, no tenderness to palpation, no distention. Skin: warm and dry. not jaundiced.  Neurological: No focal deficit present. alert. Oriented.  Psychiatric: Mood and Affect congruent.    The results of significant diagnostics from this hospitalization (including imaging, microbiology, ancillary and laboratory) are listed below for reference.     Microbiology: Recent Results (from the past 240 hours)  Resp panel by RT-PCR (RSV, Flu A&B, Covid) Anterior Nasal Swab     Status: None   Collection Time: 01/22/24  5:08 AM   Specimen: Anterior Nasal Swab  Result Value Ref Range Status   SARS Coronavirus 2 by RT PCR NEGATIVE NEGATIVE Final    Comment: (NOTE) SARS-CoV-2 target nucleic acids are NOT DETECTED.  The SARS-CoV-2 RNA is generally detectable in upper respiratory specimens during the acute  phase of infection. The lowest concentration of SARS-CoV-2 viral copies this assay can detect is 138 copies/mL. A negative result does not preclude SARS-Cov-2 infection and should not be used as the sole basis for treatment or other patient management decisions. A negative result may occur with  improper specimen collection/handling, submission of specimen other than nasopharyngeal swab, presence of viral mutation(s) within the areas targeted by this assay, and inadequate number of viral copies(<138 copies/mL). A negative result must be combined with clinical observations, patient history, and epidemiological information. The expected result is  Negative.  Fact Sheet for Patients:  BloggerCourse.com  Fact Sheet for Healthcare Providers:  SeriousBroker.it  This test is no t yet approved or cleared by the [place]  FDA and  has been authorized for detection and/or diagnosis of SARS-CoV-2 by FDA under an Emergency Use Authorization (EUA). This EUA will remain  in effect (meaning this test can be used) for the duration of the COVID-19 declaration under Section 564(b)(1) of the Act, 21 U.S.C.section 360bbb-3(b)(1), unless the authorization is terminated  or revoked sooner.       Influenza A by PCR NEGATIVE NEGATIVE Final   Influenza B by PCR NEGATIVE NEGATIVE Final    Comment: (NOTE) The Xpert Xpress SARS-CoV-2/FLU/RSV plus assay is intended as an aid in the diagnosis of influenza from Nasopharyngeal swab specimens and should not be used as a sole basis for treatment. Nasal washings and aspirates are unacceptable for Xpert Xpress SARS-CoV-2/FLU/RSV testing.  Fact Sheet for Patients: BloggerCourse.com  Fact Sheet for Healthcare Providers: SeriousBroker.it  This test is not yet approved or cleared by the [place]  FDA and has been authorized for detection and/or diagnosis of SARS-CoV-2 by FDA under an Emergency Use Authorization (EUA). This EUA will remain in effect (meaning this test can be used) for the duration of the COVID-19 declaration under Section 564(b)(1) of the Act, 21 U.S.C. section 360bbb-3(b)(1), unless the authorization is terminated or revoked.     Resp Syncytial Virus by PCR NEGATIVE NEGATIVE Final    Comment: (NOTE) Fact Sheet for Patients: BloggerCourse.com  Fact Sheet for Healthcare Providers: SeriousBroker.it  This test is not yet approved or cleared by the [place]  FDA and has been authorized for detection and/or diagnosis of  SARS-CoV-2 by FDA under an Emergency Use Authorization (EUA). This EUA will remain in effect (meaning this test can be used) for the duration of the COVID-19 declaration under Section 564(b)(1) of the Act, 21 U.S.C. section 360bbb-3(b)(1), unless the authorization is terminated or revoked.  Performed at [facility], [address] [place], [place] [address]   Blood culture (routine x 2)     Status: None (Preliminary result)   Collection Time: 01/22/24  8:00 AM   Specimen: BLOOD LEFT FOREARM  Result Value Ref Range Status   Specimen Description BLOOD LEFT FOREARM  Final   Special Requests   Final    BOTTLES DRAWN AEROBIC AND ANAEROBIC Blood Culture results may not be optimal due to an inadequate volume of blood received in culture bottles   Culture   Final    NO GROWTH 2 DAYS Performed at [facility], [address] [place], [place] [address]    Report Status PENDING  Incomplete  Blood culture (routine x 2)     Status: None (Preliminary result)   Collection Time: 01/22/24  8:00 AM   Specimen: BLOOD  Result Value Ref Range Status   Specimen Description BLOOD LEFT BICEP  Final   Special Requests  Final    BOTTLES DRAWN AEROBIC AND ANAEROBIC Blood Culture results may not be optimal due to an inadequate volume of blood received in culture bottles   Culture   Final    NO GROWTH 2 DAYS Performed at [facility], [address] [place], [place] [address]    Report Status PENDING  Incomplete     Labs: BNP (last 3 results) No results for input(s): BNP in the last 8760 hours. Basic Metabolic Panel: Recent Labs  Lab 01/22/24 0508 01/23/24 0600  NA 138 138  K 4.9 4.1  CL 99 104  CO2 25 28  GLUCOSE 102* 89  BUN 40* 29*  CREATININE 1.37* 1.19  CALCIUM  9.5 8.4*  MG 2.1 2.0  PHOS 3.4 3.3   Liver Function Tests: Recent Labs  Lab 01/22/24 0508 01/23/24 0600  AST 30 36  ALT 15 23  ALKPHOS 50 42  BILITOT 0.8 0.8  PROT 6.1* 5.4*   ALBUMIN 3.2* 3.0*   Recent Labs  Lab 01/22/24 0508  LIPASE 30   No results for input(s): AMMONIA in the last 168 hours. CBC: Recent Labs  Lab 01/18/24 1247 01/22/24 0508 01/23/24 0600  WBC 6.3 11.8* 7.8  NEUTROABS  --  9.9*  --   HGB 12.4* 11.3* 10.3*  HCT 39.7 36.1* 31.2*  MCV 105.9* 104.3* 102.3*  PLT 213 204 157   Cardiac Enzymes: No results for input(s): CKTOTAL, CKMB, CKMBINDEX, TROPONINI in the last 168 hours. BNP: Invalid input(s): POCBNP CBG: No results for input(s): GLUCAP in the last 168 hours. D-Dimer No results for input(s): DDIMER in the last 72 hours. Hgb A1c No results for input(s): HGBA1C in the last 72 hours. Lipid Profile No results for input(s): CHOL, HDL, LDLCALC, TRIG, CHOLHDL, LDLDIRECT in the last 72 hours. Thyroid  function studies No results for input(s): TSH, T4TOTAL, T3FREE, THYROIDAB in the last 72 hours.  Invalid input(s): FREET3 Anemia work up No results for input(s): VITAMINB12, FOLATE, FERRITIN, TIBC, IRON, RETICCTPCT in the last 72 hours. Urinalysis    Component Value Date/Time   COLORURINE YELLOW (A) 01/22/2024 0840   APPEARANCEUR CLOUDY (A) 01/22/2024 0840   LABSPEC 1.027 01/22/2024 0840   PHURINE 7.0 01/22/2024 0840   GLUCOSEU NEGATIVE 01/22/2024 0840   GLUCOSEU NEGATIVE 01/31/2013 1254   HGBUR NEGATIVE 01/22/2024 0840   HGBUR large 01/21/2010 1102   BILIRUBINUR NEGATIVE 01/22/2024 0840   BILIRUBINUR negative 06/07/2016 1048   KETONESUR NEGATIVE 01/22/2024 0840   PROTEINUR NEGATIVE 01/22/2024 0840   UROBILINOGEN 0.2 06/07/2016 1048   UROBILINOGEN 0.2 03/16/2014 1426   NITRITE NEGATIVE 01/22/2024 0840   LEUKOCYTESUR NEGATIVE 01/22/2024 0840   Sepsis Labs Recent Labs  Lab 01/18/24 1247 01/22/24 0508 01/23/24 0600  WBC 6.3 11.8* 7.8   Microbiology Recent Results (from the past 240 hours)  Resp panel by RT-PCR (RSV, Flu A&B, Covid) Anterior Nasal Swab     Status:  None   Collection Time: 01/22/24  5:08 AM   Specimen: Anterior Nasal Swab  Result Value Ref Range Status   SARS Coronavirus 2 by RT PCR NEGATIVE NEGATIVE Final    Comment: (NOTE) SARS-CoV-2 target nucleic acids are NOT DETECTED.  The SARS-CoV-2 RNA is generally detectable in upper respiratory specimens during the acute phase of infection. The lowest concentration of SARS-CoV-2 viral copies this assay can detect is 138 copies/mL. A negative result does not preclude SARS-Cov-2 infection and should not be used as the sole basis for treatment or other patient management decisions. A negative result  may occur with  improper specimen collection/handling, submission of specimen other than nasopharyngeal swab, presence of viral mutation(s) within the areas targeted by this assay, and inadequate number of viral copies(<138 copies/mL). A negative result must be combined with clinical observations, patient history, and epidemiological information. The expected result is Negative.  Fact Sheet for Patients:  BloggerCourse.com  Fact Sheet for Healthcare Providers:  SeriousBroker.it  This test is no t yet approved or cleared by the [place]  FDA and  has been authorized for detection and/or diagnosis of SARS-CoV-2 by FDA under an Emergency Use Authorization (EUA). This EUA will remain  in effect (meaning this test can be used) for the duration of the COVID-19 declaration under Section 564(b)(1) of the Act, 21 U.S.C.section 360bbb-3(b)(1), unless the authorization is terminated  or revoked sooner.       Influenza A by PCR NEGATIVE NEGATIVE Final   Influenza B by PCR NEGATIVE NEGATIVE Final    Comment: (NOTE) The Xpert Xpress SARS-CoV-2/FLU/RSV plus assay is intended as an aid in the diagnosis of influenza from Nasopharyngeal swab specimens and should not be used as a sole basis for treatment. Nasal washings and aspirates are unacceptable  for Xpert Xpress SARS-CoV-2/FLU/RSV testing.  Fact Sheet for Patients: BloggerCourse.com  Fact Sheet for Healthcare Providers: SeriousBroker.it  This test is not yet approved or cleared by the [place]  FDA and has been authorized for detection and/or diagnosis of SARS-CoV-2 by FDA under an Emergency Use Authorization (EUA). This EUA will remain in effect (meaning this test can be used) for the duration of the COVID-19 declaration under Section 564(b)(1) of the Act, 21 U.S.C. section 360bbb-3(b)(1), unless the authorization is terminated or revoked.     Resp Syncytial Virus by PCR NEGATIVE NEGATIVE Final    Comment: (NOTE) Fact Sheet for Patients: BloggerCourse.com  Fact Sheet for Healthcare Providers: SeriousBroker.it  This test is not yet approved or cleared by the [place]  FDA and has been authorized for detection and/or diagnosis of SARS-CoV-2 by FDA under an Emergency Use Authorization (EUA). This EUA will remain in effect (meaning this test can be used) for the duration of the COVID-19 declaration under Section 564(b)(1) of the Act, 21 U.S.C. section 360bbb-3(b)(1), unless the authorization is terminated or revoked.  Performed at [facility], [address] [place], [place] [address]   Blood culture (routine x 2)     Status: None (Preliminary result)   Collection Time: 01/22/24  8:00 AM   Specimen: BLOOD LEFT FOREARM  Result Value Ref Range Status   Specimen Description BLOOD LEFT FOREARM  Final   Special Requests   Final    BOTTLES DRAWN AEROBIC AND ANAEROBIC Blood Culture results may not be optimal due to an inadequate volume of blood received in culture bottles   Culture   Final    NO GROWTH 2 DAYS Performed at [facility], [address] [place], [place] [address]    Report Status PENDING  Incomplete  Blood culture (routine x 2)      Status: None (Preliminary result)   Collection Time: 01/22/24  8:00 AM   Specimen: BLOOD  Result Value Ref Range Status   Specimen Description BLOOD LEFT BICEP  Final   Special Requests   Final    BOTTLES DRAWN AEROBIC AND ANAEROBIC Blood Culture results may not be optimal due to an inadequate volume of blood received in culture bottles   Culture   Final    NO GROWTH 2 DAYS Performed at [facility]  [facility], [address] [place], [place] [address]    Report Status PENDING  Incomplete     Time coordinating discharge: 32 min  SIGNED: Lorane Poland, DO [facility] 01/24/2024, 1:51 PM Pager   If 7PM-7AM, please contact night-coverage

## 2024-01-24 NOTE — Progress Notes (Signed)
 Brief Assessment Note  Medical record reviewed and patient has no TOC needs at this time. Please outreach to [facility] if needs are identified.

## 2024-01-24 NOTE — Progress Notes (Signed)
 Speech Language Pathology Treatment: Dysphagia  Patient Details Name: Shawn Lam MRN: [id number] DOB: July 23, 1950 Today's Date: 01/24/2024 Time: 9174-9157 SLP Time Calculation (min) (ACUTE ONLY): 17 min  Assessment / Plan / Recommendation Clinical Impression  Pt with difficulty recalling need for head turn, chin tuck when consuming ice chips during today's session. With moderate to maximal cues, pt able to consistently utilize. Pt highly distracted and benefits from having the TV off for optimum participation.   Session also targeted pharyngeal strengthening exercises.  Masako - pt required max A verbal cues to complete 2 sets of 5 repetitions  Pt continues with concern for confusion, hopeful for improvement with increased nutrition and hydration.   ST to continue to follow.     HPI HPI: Shawn Lam is a 73 y.o. male with medical history significant of Buerger's disease, dyslipidemia, GAD, CAD, GERD, sleep apnea, hypothyroidism, CKD stage IIIa, silent aspiration in context of dysphagia, chronic hypoxemia on nasal cannula oxygen , and prior ACDF surgery.  Patient was brought to the ER by EMS after having a witnessed fall.  Prior to this he is stated he was feeling dizzy and was unable to stand any longer.  Upon presentation he was alert but confused.  Wife states that patient has been having multiple episodes of feeling dizzy and weak upon standing.  Of note patient had a PEG tube placed on 10/9 in radiology.  He had been started on bolus feedings but was not tolerating these well and over the past 24 hours has had multiple episodes of emesis.  In the ED he was noted to have an elevated white count of 11,800.  CT of the chest revealed multifocal pneumonia and an incidental finding of constipation.  Of note there was an incidental finding of possible free air but after review with the interventional radiologist who placed the PEG tube Dr. Jenna this was an expected amount of free air and it  was deemed appropriate to use the PEG tube.  Viral respiratory panel is negative.      SLP Plan  Continue with current plan of care          Recommendations  Diet recommendations: NPO (ice chips) Medication Administration: Via alternative means Supervision: Patient able to self feed;Full supervision/cueing for compensatory strategies Compensations: Minimize environmental distractions;Slow rate;Small sips/bites (head turn to right with chin tuck) Postural Changes and/or Swallow Maneuvers: Seated upright 90 degrees;Upright 30-60 min after meal;Head turn right during swallow;Chin tuck                  Oral care QID;Oral care prior to ice chip/H20   Frequent or constant Supervision/Assistance Dysphagia, pharyngeal phase (R13.13)     Continue with current plan of care    Adelis Docter B. Rubbie, M.S., CCC-SLP, Tree surgeon Certified Brain Injury Specialist [facility]  [facility] Office [phone] Ascom [phone] Fax [phone]

## 2024-01-24 NOTE — TOC Transition Note (Signed)
 Transition of Care [facility]) - Discharge Note   Patient Details  Name: Shawn Lam MRN: [id number] Date of Birth: 1951-03-27  [facility] CM/SW Contact:  Dalia GORMAN Fuse, RN Phone Number: 01/24/2024, 1:48 PM   Clinical Narrative:     Patient medically clear to discharge to home with [facility] PT/OT/RN/Aide. TOC spoke with the patient's wife and they did not have a preference. Referral sent in the hub and [facility] accepted in that order. TOC selected [facility].   No other TOC needs identified.  Final next level of care: Home w Home Health Services Barriers to Discharge: Barriers Resolved   Patient Goals and CMS Choice     Choice offered to / list presented to : Spouse, Patient      Discharge Placement                       Discharge Plan and Services Additional resources added to the After Visit Summary for                            [facility] Arranged: PT, OT, RN, Nurse's Aide [facility] Agency: [facility] Date [facility] Agency Contacted: 01/24/24 Time HH Agency Contacted: [phone] Representative spoke with at [facility] Agency: Joane  Social Drivers of Health (SDOH) Interventions SDOH Screenings   Food Insecurity: Patient Declined (01/22/2024)  Housing: Unknown (01/22/2024)  Transportation Needs: Patient Declined (01/22/2024)  Utilities: Patient Declined (01/22/2024)  Alcohol Screen: Low Risk  (10/06/2023)  Depression (PHQ2-9): High Risk (12/29/2023)  Financial Resource Strain: Low Risk  (10/06/2023)  Physical Activity: Sufficiently Active (10/06/2023)  Social Connections: Patient Declined (01/22/2024)  Stress: Stress Concern Present (10/06/2023)  Tobacco Use: Low Risk  (01/22/2024)  Health Literacy: Adequate Health Literacy (03/27/2023)     Readmission Risk Interventions     No data to display

## 2024-01-25 ENCOUNTER — Ambulatory Visit: Admitting: Speech Pathology

## 2024-01-25 ENCOUNTER — Ambulatory Visit: Admitting: Physical Therapy

## 2024-01-25 ENCOUNTER — Encounter: Admitting: Physical Therapy

## 2024-01-25 ENCOUNTER — Encounter (HOSPITAL_COMMUNITY): Payer: Self-pay | Admitting: Pulmonary Disease

## 2024-01-25 ENCOUNTER — Telehealth: Payer: Self-pay

## 2024-01-25 ENCOUNTER — Encounter: Payer: Self-pay | Admitting: Cardiovascular Disease

## 2024-01-25 NOTE — Patient Instructions (Addendum)
 Visit Information  Thank you for taking time to visit with me today. Please don't hesitate to contact me if I can be of assistance to you.  Patient instructions:  Complete by: As directed      Call MD for:  persistant dizziness or light-headedness      Call MD for:  persistant nausea and vomiting      Call MD for:  severe uncontrolled pain      Call MD for:  temperature >100.4     Please take your blood pressure once a day and keep a log.    Patient verbalizes understanding of instructions and care plan provided today and agrees to view in MyChart. Active MyChart status and patient understanding of how to access instructions and care plan via MyChart confirmed with patient.     The patient has been provided with contact information for the care management team and has been advised to call with any health related questions or concerns.   Please call the care guide team at [phone] if you need to cancel or reschedule your appointment.   Please call the Suicide and Crisis Lifeline: 988 call the [place]  National Suicide Prevention Lifeline: 873-059-5457 or TTY: 269-616-0736 TTY 705-427-8034) to talk to a trained counselor call 1-800-273-TALK (toll free, 24 hour hotline) if you are experiencing a Mental Health or Behavioral Health Crisis or need someone to talk to.  Arvin Seip RN, BSN, CCM [employer], Population Health Case Manager Phone: [phone]

## 2024-01-25 NOTE — Transitions of Care (Post Inpatient/ED Visit) (Signed)
 01/25/2024  Name: Shawn Lam MRN: [id number] DOB: [birth year]  Today's TOC FU Call Status: Today's TOC FU Call Status:: Successful TOC FU Call Completed TOC FU Call Complete Date: 01/25/24 Patient's Name and Date of Birth confirmed.  Transition Care Management Follow-up Telephone Call Date of Discharge: 01/24/24 Discharge Facility: [facility]) Type of Discharge: Inpatient Admission Primary Inpatient Discharge Diagnosis:: aspiration pneumonia How have you been since you were released from the hospital?: Better Any questions or concerns?: No  Items Reviewed: Did you receive and understand the discharge instructions provided?: Yes Medications obtained,verified, and reconciled?: Yes (Medications Reviewed) Any new allergies since your discharge?: No Dietary orders reviewed?: Yes Type of Diet Ordered:: PEG tube feeding Do you have support at home?: Yes People in Home [RPT]: spouse Name of Support/Comfort Primary Source: [name] Mohr  Medications Reviewed Today: Medications Reviewed Today     Reviewed by Skyy Mcknight E, RN (Registered Nurse) on 01/25/24 at 1607  Med List Status: <None>   Medication Order Taking? Sig Documenting Provider Last Dose Status Informant  amoxicillin -clavulanate (AUGMENTIN) 400-57 MG/5ML suspension [id number] Yes Place 11 mLs into feeding tube 2 (two) times daily for 4 days. Discard remainder. Dezii, Alexandra, DO  Active   aspirin  81 MG chewable tablet [id number] Yes Place 1 tablet (81 mg total) into feeding tube daily. Dezii, Alexandra, DO  Active   atorvastatin  (LIPITOR ) 80 MG tablet [id number] Yes Place 1 tablet (80 mg total) into feeding tube daily. Dezii, Alexandra, DO  Active   baclofen (LIORESAL) 10 MG tablet [id number] Yes Place 1 tablet (10 mg total) into feeding tube 2 (two) times daily. Dezii, Alexandra, DO  Active   buPROPion  (WELLBUTRIN  XL) 150 MG 24 hr tablet [id number] Yes Take 2 tablets (300 mg total) by mouth  daily. Dezii, Alexandra, DO  Active   busPIRone  (BUSPAR ) 10 MG tablet [id number] Yes Place 2 tablets (20 mg total) into feeding tube 3 (three) times daily. Dezii, Alexandra, DO  Active   Cholecalciferol  (VITAMIN D3) 50 MCG (2000 UT) TABS [id number] Yes Place 1 tablet (2,000 Units total) into feeding tube daily. Dezii, Alexandra, DO  Active   clopidogrel  (PLAVIX ) 75 MG tablet [id number] Yes Place 1 tablet (75 mg total) into feeding tube daily. Dezii, Alexandra, DO  Active   cyanocobalamin  1000 MCG tablet [id number] Yes Place 1 tablet (1,000 mcg total) into feeding tube daily. Dezii, Alexandra, DO  Active   denosumab  (PROLIA ) 60 MG/ML SOSY injection [id number] Yes Inject 60 mg into the skin every 6 (six) months. Do not send RX, order this from the office [provider]  Active Self, Spouse/Significant Other  DULoxetine  (CYMBALTA ) 30 MG capsule [id number] Yes TAKE THREE CAPSULES BY MOUTH DAILY Bedsole, Amy E, MD  Active Self, Spouse/Significant Other  Erenumab-aooe (AIMOVIG) 140 MG/ML SOAJ [id number] Yes 140 mg every 28 (twenty-eight) days. [provider]  Active Self, Spouse/Significant Other  finasteride  (PROSCAR ) 5 MG tablet [id number] Yes Take 5 mg by mouth daily. [provider]  Active Self, Spouse/Significant Other  levothyroxine  (SYNTHROID ) 88 MCG tablet [id number] Yes Place 1 tablet (88 mcg total) into feeding tube daily before breakfast. Dezii, Alexandra, DO  Active   magnesium  oxide (MAG-OX) 400 (240 Mg) MG tablet [id number] Yes Place 1 tablet (400 mg total) into feeding tube daily. Dezii, Alexandra, DO  Active   metoprolol  tartrate (LOPRESSOR ) 25 mg/10 mL SUSP [id number] Yes Place 4 mLs (10 mg total) into feeding tube 2 (two) times daily. Dezii, Alexandra, DO  Active  nitroGLYCERIN  (NITROSTAT ) 0.4 MG SL tablet [id number] Yes Place 1 tablet (0.4 mg total) under the tongue every 5 (five) minutes as needed for chest pain. Avelina Greig BRAVO, MD  Active Self, Spouse/Significant Other   Nutritional Supplements (FEEDING SUPPLEMENT, OSMOLITE 1.5 CAL,) LIQD [id number] Yes Place 120 mLs into feeding tube 6 (six) times daily. Dezii, Alexandra, DO  Active   thiamine (VITAMIN B1) 100 MG tablet [id number] Yes Place 1 tablet (100 mg total) into feeding tube daily for 4 days. Dezii, Alexandra, DO  Active   triamcinolone  cream (KENALOG ) 0.1 % [id number] Yes APPLY 2 TOPICALLY DAILY , DO NOT APPLY ON DACE , GROIN AND UNDERARMS. Avelina Greig BRAVO, MD  Active Self, Spouse/Significant Other  Water  For Irrigation, Sterile (FREE WATER ) SOLN [id number] Yes Place 100 mLs into feeding tube 6 (six) times daily. Dezii, Alexandra, DO  Active   zolpidem  (AMBIEN ) 10 MG tablet [id number] Yes Take 1 tablet (10 mg total) by mouth at bedtime as needed for sleep. Avelina Greig BRAVO, MD  Active Self, Spouse/Significant Other  zonisamide  (ZONEGRAN ) 25 MG capsule [id number] Yes Take 1 capsule (25 mg total) by mouth daily. Avelina Greig BRAVO, MD  Active Self, Spouse/Significant Other            Home Care and Equipment/Supplies: Were Home Health Services Ordered?: No Any new equipment or medical supplies ordered?: No  Functional Questionnaire: Do you need assistance with bathing/showering or dressing?: No Do you need assistance with meal preparation?: No Do you need assistance with eating?: No Do you have difficulty maintaining continence: No Do you need assistance with getting out of bed/getting out of a chair/moving?: Yes Do you have difficulty managing or taking your medications?: No  Follow up appointments reviewed: PCP Follow-up appointment confirmed?: Yes Date of PCP follow-up appointment?: 01/26/24 Follow-up Provider: Dr. Avelina Do you need transportation to your follow-up appointment?: No Do you understand care options if your condition(s) worsen?: Yes-patient verbalized understanding  SDOH Interventions Today    Flowsheet Row Most Recent Value  SDOH Interventions   Food Insecurity Interventions  Intervention Not Indicated  Housing Interventions Intervention Not Indicated  Transportation Interventions Intervention Not Indicated  Utilities Interventions Intervention Not Indicated   Discussed and offered 30 day TOC program.  Patient's spouse declined.  The patient has been provided with contact information for the care management team and has been advised to call with any health -related questions or concerns.  The patient verbalized understanding with current plan of care.  The patient is directed to their insurance card regarding availability of benefits coverage.    Arvin Seip RN, BSN, CCM [employer], Population Health Case Manager Phone: [phone]

## 2024-01-26 ENCOUNTER — Encounter: Payer: Self-pay | Admitting: Family Medicine

## 2024-01-26 ENCOUNTER — Ambulatory Visit (INDEPENDENT_AMBULATORY_CARE_PROVIDER_SITE_OTHER): Admitting: Family Medicine

## 2024-01-26 VITALS — BP 97/62 | HR 79 | Temp 97.0°F | Ht 72.0 in | Wt 114.2 lb

## 2024-01-26 DIAGNOSIS — R634 Abnormal weight loss: Secondary | ICD-10-CM | POA: Diagnosis not present

## 2024-01-26 DIAGNOSIS — Z09 Encounter for follow-up examination after completed treatment for conditions other than malignant neoplasm: Secondary | ICD-10-CM | POA: Diagnosis not present

## 2024-01-26 NOTE — Progress Notes (Signed)
 Patient ID: Shawn Lam, male    DOB: 17-Jun-1950, 73 y.o.   MRN: [id number]  This visit was conducted in person.  BP 97/62   Pulse 79   Temp (!) 97 F (36.1 C) (Temporal)   Ht 6' (1.829 m)   Wt 114 lb 4 oz (51.8 kg)   SpO2 98%   BMI 15.50 kg/m    CC:  Chief Complaint  Patient presents with   Hospitalization Follow-up    Subjective:   HPI: Shawn Lam is a 73 y.o. male presenting on 01/26/2024 for Hospitalization Follow-up  Recent G-tube placement on October 10th for severe malnutrition, chronic aspiration, dysphagia  Admitted to hospital on January 22, 2024 following a fall with associated confusion.  Patient had been having trouble with tube feeds resulting in emesis. Found to have multifocal pneumonia on CT chest. Initially started on Zosyn, switched to Unasyn.  Treated with Augmentin for additional 4 days after discharge. By discharge he was tolerating tube feeds well on 10/15.    Today he and wife are at appt. G-tube site healing well Continuing to tolerate tube feeds now after discharge.  Wife grinding tablets.  Discussed minimizing sitting medications as able.  Wife reports he had significant diarrhea this AM Wt Readings from Last 3 Encounters:  02/06/24 119 lb 9.6 oz (54.3 kg)  02/01/24 116 lb 3.2 oz (52.7 kg)  01/26/24 114 lb 4 oz (51.8 kg)    Relevant past medical, surgical, family and social history reviewed and updated as indicated. Interim medical history since our last visit reviewed. Allergies and medications reviewed and updated. Outpatient Medications Prior to Visit  Medication Sig Dispense Refill   amoxicillin -clavulanate (AUGMENTIN) 400-57 MG/5ML suspension Place 11 mLs into feeding tube 2 (two) times daily for 4 days. Discard remainder. 100 mL 0   aspirin  81 MG chewable tablet Place 1 tablet (81 mg total) into feeding tube daily.     atorvastatin  (LIPITOR ) 80 MG tablet Place 1 tablet (80 mg total) into feeding tube daily.     baclofen  (LIORESAL) 10 MG tablet Place 1 tablet (10 mg total) into feeding tube 2 (two) times daily.     buPROPion  (WELLBUTRIN  XL) 150 MG 24 hr tablet Take 2 tablets (300 mg total) by mouth daily.     busPIRone  (BUSPAR ) 10 MG tablet Place 2 tablets (20 mg total) into feeding tube 3 (three) times daily.     Cholecalciferol  (VITAMIN D3) 50 MCG (2000 UT) TABS Place 1 tablet (2,000 Units total) into feeding tube daily.     clopidogrel  (PLAVIX ) 75 MG tablet Place 1 tablet (75 mg total) into feeding tube daily.     cyanocobalamin  1000 MCG tablet Place 1 tablet (1,000 mcg total) into feeding tube daily.     denosumab  (PROLIA ) 60 MG/ML SOSY injection Inject 60 mg into the skin every 6 (six) months. Do not send RX, order this from the office     DULoxetine  (CYMBALTA ) 30 MG capsule TAKE THREE CAPSULES BY MOUTH DAILY 90 capsule 5   Erenumab-aooe (AIMOVIG) 140 MG/ML SOAJ 140 mg every 28 (twenty-eight) days.     finasteride  (PROSCAR ) 5 MG tablet Take 5 mg by mouth daily.     levothyroxine  (SYNTHROID ) 88 MCG tablet Place 1 tablet (88 mcg total) into feeding tube daily before breakfast.     magnesium  oxide (MAG-OX) 400 (240 Mg) MG tablet Place 1 tablet (400 mg total) into feeding tube daily.     metoprolol  tartrate (LOPRESSOR ) 25  mg/10 mL SUSP Place 4 mLs (10 mg total) into feeding tube 2 (two) times daily.     nitroGLYCERIN  (NITROSTAT ) 0.4 MG SL tablet Place 1 tablet (0.4 mg total) under the tongue every 5 (five) minutes as needed for chest pain. 10 tablet 1   Nutritional Supplements (FEEDING SUPPLEMENT, OSMOLITE 1.5 CAL,) LIQD Place 120 mLs into feeding tube 6 (six) times daily.     thiamine (VITAMIN B1) 100 MG tablet Place 1 tablet (100 mg total) into feeding tube daily for 4 days. 4 tablet 0   triamcinolone  cream (KENALOG ) 0.1 % APPLY 2 TOPICALLY DAILY , DO NOT APPLY ON DACE , GROIN AND UNDERARMS. 454 g 0   Water  For Irrigation, Sterile (FREE WATER ) SOLN Place 100 mLs into feeding tube 6 (six) times daily.      zonisamide  (ZONEGRAN ) 25 MG capsule Take 1 capsule (25 mg total) by mouth daily. 4 capsule 0   zolpidem  (AMBIEN ) 10 MG tablet Take 1 tablet (10 mg total) by mouth at bedtime as needed for sleep. 30 tablet 0   No facility-administered medications prior to visit.     Per HPI unless specifically indicated in ROS section below Review of Systems  Constitutional:  Negative for fatigue and fever.  HENT:  Negative for ear pain.   Eyes:  Negative for pain.  Respiratory:  Negative for cough and shortness of breath.   Cardiovascular:  Negative for chest pain, palpitations and leg swelling.  Gastrointestinal:  Negative for abdominal pain.  Genitourinary:  Negative for dysuria.  Musculoskeletal:  Negative for arthralgias.  Neurological:  Negative for syncope, light-headedness and headaches.  Psychiatric/Behavioral:  Negative for dysphoric mood.    Objective:  BP 97/62   Pulse 79   Temp (!) 97 F (36.1 C) (Temporal)   Ht 6' (1.829 m)   Wt 114 lb 4 oz (51.8 kg)   SpO2 98%   BMI 15.50 kg/m   Wt Readings from Last 3 Encounters:  02/06/24 119 lb 9.6 oz (54.3 kg)  02/01/24 116 lb 3.2 oz (52.7 kg)  01/26/24 114 lb 4 oz (51.8 kg)      Physical Exam Vitals reviewed.  Constitutional:      Appearance: He is well-developed. He is ill-appearing.  HENT:     Head: Normocephalic.     Right Ear: Hearing normal.     Left Ear: Hearing normal.     Nose: Nose normal.  Neck:     Thyroid : No thyroid  mass or thyromegaly.     Vascular: No carotid bruit.     Trachea: Trachea normal.  Cardiovascular:     Rate and Rhythm: Normal rate and regular rhythm.     Pulses: Normal pulses.     Heart sounds: Heart sounds not distant. No murmur heard.    No friction rub. No gallop.     Comments: No peripheral edema Pulmonary:     Effort: Pulmonary effort is normal. No respiratory distress.     Breath sounds: Normal breath sounds.  Skin:    General: Skin is warm and dry.     Findings: No rash.  Psychiatric:         Speech: Speech normal.        Behavior: Behavior normal.        Thought Content: Thought content normal.       Results for orders placed or performed during the hospital encounter of 01/22/24  Resp panel by RT-PCR (RSV, Flu A&B, Covid) Anterior Nasal Swab   Collection  Time: 01/22/24  5:08 AM   Specimen: Anterior Nasal Swab  Result Value Ref Range   SARS Coronavirus 2 by RT PCR NEGATIVE NEGATIVE   Influenza A by PCR NEGATIVE NEGATIVE   Influenza B by PCR NEGATIVE NEGATIVE   Resp Syncytial Virus by PCR NEGATIVE NEGATIVE  Comprehensive metabolic panel with GFR   Collection Time: 01/22/24  5:08 AM  Result Value Ref Range   Sodium 138 135 - 145 mmol/L   Potassium 4.9 3.5 - 5.1 mmol/L   Chloride 99 98 - 111 mmol/L   CO2 25 22 - 32 mmol/L   Glucose, Bld 102 (H) 70 - 99 mg/dL   BUN 40 (H) 8 - 23 mg/dL   Creatinine, Ser 8.62 (H) 0.61 - 1.24 mg/dL   Calcium  9.5 8.9 - 10.3 mg/dL   Total Protein 6.1 (L) 6.5 - 8.1 g/dL   Albumin 3.2 (L) 3.5 - 5.0 g/dL   AST 30 15 - 41 U/L   ALT 15 0 - 44 U/L   Alkaline Phosphatase 50 38 - 126 U/L   Total Bilirubin 0.8 0.0 - 1.2 mg/dL   GFR, Estimated 54 (L) >60 mL/min   Anion gap 14 5 - 15  CBC with Differential/Platelet   Collection Time: 01/22/24  5:08 AM  Result Value Ref Range   WBC 11.8 (H) 4.0 - 10.5 K/uL   RBC 3.46 (L) 4.22 - 5.81 MIL/uL   Hemoglobin 11.3 (L) 13.0 - 17.0 g/dL   HCT 63.8 (L) 60.9 - 47.9 %   MCV 104.3 (H) 80.0 - 100.0 fL   MCH 32.7 26.0 - 34.0 pg   MCHC 31.3 30.0 - 36.0 g/dL   RDW 86.7 88.4 - 84.4 %   Platelets 204 150 - 400 K/uL   nRBC 0.0 0.0 - 0.2 %   Neutrophils Relative % 85 %   Neutro Abs 9.9 (H) 1.7 - 7.7 K/uL   Lymphocytes Relative 9 %   Lymphs Abs 1.1 0.7 - 4.0 K/uL   Monocytes Relative 5 %   Monocytes Absolute 0.6 0.1 - 1.0 K/uL   Eosinophils Relative 0 %   Eosinophils Absolute 0.0 0.0 - 0.5 K/uL   Basophils Relative 0 %   Basophils Absolute 0.0 0.0 - 0.1 K/uL   Immature Granulocytes 1 %   Abs Immature  Granulocytes 0.16 (H) 0.00 - 0.07 K/uL  Magnesium    Collection Time: 01/22/24  5:08 AM  Result Value Ref Range   Magnesium  2.1 1.7 - 2.4 mg/dL  Phosphorus   Collection Time: 01/22/24  5:08 AM  Result Value Ref Range   Phosphorus 3.4 2.5 - 4.6 mg/dL  Lipase, blood   Collection Time: 01/22/24  5:08 AM  Result Value Ref Range   Lipase 30 11 - 51 U/L  Troponin I (High Sensitivity)   Collection Time: 01/22/24  5:08 AM  Result Value Ref Range   Troponin I (High Sensitivity) 4 <18 ng/L  Blood culture (routine x 2)   Collection Time: 01/22/24  8:00 AM   Specimen: BLOOD LEFT FOREARM  Result Value Ref Range   Specimen Description BLOOD LEFT FOREARM    Special Requests      BOTTLES DRAWN AEROBIC AND ANAEROBIC Blood Culture results may not be optimal due to an inadequate volume of blood received in culture bottles   Culture      NO GROWTH 5 DAYS Performed at [facility], [address] [place], [place] [address]    Report Status 01/27/2024 FINAL  Blood culture (routine x 2)   Collection Time: 01/22/24  8:00 AM   Specimen: BLOOD  Result Value Ref Range   Specimen Description BLOOD LEFT BICEP    Special Requests      BOTTLES DRAWN AEROBIC AND ANAEROBIC Blood Culture results may not be optimal due to an inadequate volume of blood received in culture bottles   Culture      NO GROWTH 5 DAYS Performed at [facility], [address] [place], [place] [address]    Report Status 01/27/2024 FINAL   Lactic acid, plasma   Collection Time: 01/22/24  8:00 AM  Result Value Ref Range   Lactic Acid, Venous 1.2 0.5 - 1.9 mmol/L  Procalcitonin   Collection Time: 01/22/24  8:00 AM  Result Value Ref Range   Procalcitonin 1.11 ng/mL  Troponin I (High Sensitivity)   Collection Time: 01/22/24  8:00 AM  Result Value Ref Range   Troponin I (High Sensitivity) 4 <18 ng/L  Urinalysis, Routine w reflex microscopic -Urine, Clean Catch   Collection Time: 01/22/24  8:40 AM  Result  Value Ref Range   Color, Urine YELLOW (A) YELLOW   APPearance CLOUDY (A) CLEAR   Specific Gravity, Urine 1.027 1.005 - 1.030   pH 7.0 5.0 - 8.0   Glucose, UA NEGATIVE NEGATIVE mg/dL   Hgb urine dipstick NEGATIVE NEGATIVE   Bilirubin Urine NEGATIVE NEGATIVE   Ketones, ur NEGATIVE NEGATIVE mg/dL   Protein, ur NEGATIVE NEGATIVE mg/dL   Nitrite NEGATIVE NEGATIVE   Leukocytes,Ua NEGATIVE NEGATIVE  Lactic acid, plasma   Collection Time: 01/22/24  6:59 PM  Result Value Ref Range   Lactic Acid, Venous 1.8 0.5 - 1.9 mmol/L  CBC   Collection Time: 01/23/24  6:00 AM  Result Value Ref Range   WBC 7.8 4.0 - 10.5 K/uL   RBC 3.05 (L) 4.22 - 5.81 MIL/uL   Hemoglobin 10.3 (L) 13.0 - 17.0 g/dL   HCT 68.7 (L) 60.9 - 47.9 %   MCV 102.3 (H) 80.0 - 100.0 fL   MCH 33.8 26.0 - 34.0 pg   MCHC 33.0 30.0 - 36.0 g/dL   RDW 86.7 88.4 - 84.4 %   Platelets 157 150 - 400 K/uL   nRBC 0.0 0.0 - 0.2 %  Magnesium    Collection Time: 01/23/24  6:00 AM  Result Value Ref Range   Magnesium  2.0 1.7 - 2.4 mg/dL  Phosphorus   Collection Time: 01/23/24  6:00 AM  Result Value Ref Range   Phosphorus 3.3 2.5 - 4.6 mg/dL  Comprehensive metabolic panel   Collection Time: 01/23/24  6:00 AM  Result Value Ref Range   Sodium 138 135 - 145 mmol/L   Potassium 4.1 3.5 - 5.1 mmol/L   Chloride 104 98 - 111 mmol/L   CO2 28 22 - 32 mmol/L   Glucose, Bld 89 70 - 99 mg/dL   BUN 29 (H) 8 - 23 mg/dL   Creatinine, Ser 8.80 0.61 - 1.24 mg/dL   Calcium  8.4 (L) 8.9 - 10.3 mg/dL   Total Protein 5.4 (L) 6.5 - 8.1 g/dL   Albumin 3.0 (L) 3.5 - 5.0 g/dL   AST 36 15 - 41 U/L   ALT 23 0 - 44 U/L   Alkaline Phosphatase 42 38 - 126 U/L   Total Bilirubin 0.8 0.0 - 1.2 mg/dL   GFR, Estimated >39 >39 mL/min   Anion gap 6 5 - 15   *Note: Due to a large number of results and/or  encounters for the requested time period, some results have not been displayed. A complete set of results can be found in Results Review.    Assessment and  Plan  Weight loss, non-intentional Assessment & Plan: Acute, after hospitalization patient now tolerating G-tube feeds well.  Administering medication by grinding through G-tube. He has started to have some weight gain although very minimal. He continues to feel weak and tired. No acute diarrhea this morning likely due to load of medications in G-tube.  Discussed ways we could limit medications.  He will consider.  Return and ER precautions provided.   S/P gastrostomy tube (G tube) placement, follow-up exam    No follow-ups on file.   Greig Ring, MD

## 2024-01-27 LAB — CULTURE, BLOOD (ROUTINE X 2)
Culture: NO GROWTH
Culture: NO GROWTH

## 2024-01-29 ENCOUNTER — Ambulatory Visit: Admitting: Speech Pathology

## 2024-01-29 ENCOUNTER — Ambulatory Visit

## 2024-01-30 ENCOUNTER — Encounter: Admitting: Speech Pathology

## 2024-01-30 ENCOUNTER — Encounter: Admitting: Physical Therapy

## 2024-01-30 ENCOUNTER — Ambulatory Visit

## 2024-01-31 ENCOUNTER — Telehealth: Payer: Self-pay | Admitting: Emergency Medicine

## 2024-01-31 ENCOUNTER — Encounter: Payer: Self-pay | Admitting: Family Medicine

## 2024-01-31 ENCOUNTER — Encounter: Payer: Self-pay | Admitting: Cardiovascular Disease

## 2024-01-31 NOTE — Progress Notes (Unsigned)
 Cardiology Clinic Note   Date: 02/01/2024 ID: Shawn Lam, DOB Jul 30, 1950, MRN [id number]  Primary Cardiologist:  Evalene Lunger, MD  Chief Complaint   Shawn Lam is a 73 y.o. male who presents to the clinic today for evaluation of hypotension.   Patient Profile   Shawn Lam is followed by Dr. Gollan for the history outlined below.      Past medical history significant for: CAD. LHC 02/01/2005: Proximal LAD 85%.  Nonobstructive CAD RCA.  PCI with BMS 3.5 x 12 mm to proximal LAD. LHC 06/17/2005 (abnormal stress test): Widely patent LAD stent.  Mid RCA 70%.  PCI with DES 4 x 15 mm to mid RCA. LHC 09/06/2013 (angina): Widely patent LAD and RCA stents.  25 to 30% in-stent restenosis distal margin of LAD stent.  Recommendation for medical therapy. Nuclear stress test 09/22/2021: Low risk study with no evidence of ischemia. PVCs. Echo 08/19/2021: EF 50%.  Global hypokinesis.  Normal RV size/function.  Normal PA pressure, RVSP 35.2 mmHg.  Mild MR. 14-day ZIO 08/26/2021: HR 40 to 193 bpm, average 68 bpm.  11 runs of SVT fastest 4 beats max rate 293 bpm, longest 15 beats average rate 111 bpm.  Frequent PVCs 8.4% burden.  Triggered events associated with PVCs. Carotid artery stenosis. Carotid duplex 11/13/2023: Bilateral ICA 1 to 39%.  Nonhemodynamically significant plaque bilateral CCA.  Bilateral ECA <50%. Hyperlipidemia. OSA. Inspire device. GERD. Dysphagia. S/p PEG tube 01/18/2024. Hypothyroidism. CKD stage III. Syncope/orthostatic hypotension.  In summary, patient with history of CAD s/p PCI with BMS to proximal LAD in October 2006 and DES to mid RCA March 2007.  Patient underwent cardiac MRI in July 2015 which demonstrated normal LVEF and RVEF with no evidence of LGE.  He underwent loop recorder implantation in February 2018 for history of recurrent syncope.  Device interrogations through 2021 showed no evidence of significant arrhythmias, prolonged pauses, or high-grade AV  block.  He has a long history of syncopal events thought to be related to orthostatic hypotension.  Throughout the years he has been on Florinef  and midodrine .  He was seen in the clinic in April 2023 for evaluation of recurrent syncope in the context of positional dizziness.  He underwent repeat Zio patch monitoring which demonstrated predominant rhythm was sinus with an average rate of 68 bpm, runs of SVT and frequent PVCs.  Echo at that time demonstrated EF of 50%.  He was evaluated by EP in May 2023 with recommendation for conservative approach given burden <10%.  Lexiscan  in June 2023 showed no evidence of ischemia and was a low risk study.  He was seen in clinic in March 2024 and noted to have had several falls in the past from orthostatic hypotension with last fall being in January 2024.  He was encouraged to continue midodrine  3 times a day.  Palpitations were occasional.  No medication changes for patient and no further testing was ordered.  He was evaluated in the ED in December 2024 with complaint of headache.  Blood pressure was elevated at 153/101 and 169/98.  He contacted the office triage line with reports of elevated BP.  Midodrine  was decreased and weaned off.  Upon clinic follow-up in January 2025 he reported being started on losartan  by nephrology.  Around this time he was also restarted on metoprolol  for increased palpitations.  He continued to have elevated for labile BP and was instructed to monitor BP closely taking 50 mg of losartan  initially and additional 50  mg in the evenings if BP elevated.  Patient was last seen in the office by Dr. Gollan on 12/25/2023 for routine follow-up.  His weight was back down to 222 pounds.  He reported poor appetite.  He reported mild orthostatic symptoms and BP dropped 20 mmHg after standing for 3 minutes with mild symptoms.  It was recommended he reduce his losartan  to just once a day and continue Toprol .  Patient presented to the ED on 01/22/2024 for  witnessed fall with presyncopal symptoms prior.He was noted to be confused.  BP upon arrival 107/64, HR 88.  Patient was afebrile.  Initial labs: WBC 11.8, hemoglobin 11.3, hematocrit 36.1, sodium 138, potassium 4.9, BUN 40, creatinine 1.37, magnesium  2.1, respiratory panel negative.  Troponin negative x 2.  CT chest abdomen pelvis showed small to moderate volume of intraperitoneal free gas that was not unexpected secondary to gastrostomy tube placement 4 days prior, interval progression of patchy airspace disease in the right lower lobe and posterior left lower lobe compatible with multifocal pneumonia.  He was admitted for pneumonia and treated with IV antibiotics.  Patient was discharged on 01/24/2024 and instructed to stop losartan  and Toprol .  He was started on metoprolol  tartrate suspension 25 mg twice daily.  Patient contacted the office via MyChart with complaints of shortness of breath and hypotension.  Patient provided the following BP readings: 01/29/24 96/56..72 sitting   01/30/24 108/64..65 after shower   01/31/24 80/57.70 standing  major breathlessness occurs when going from sitting to standing. Patient was provided with ED precautions and scheduled for office visit.     History of Present Illness    Today, patient is accompanied by his wife. He reports feeling short of breath upon standing with slight lightheadedness requiring him to stand for a few minutes for it to resolve. Shortness of breath described as not being able to get in good air. He does not experience any shortness of breath while seated. He is unsure if his symptoms are related to his lungs, his heart or his BP. He has not taken losartan  since hospital discharge. He was instructed by Dr. Gollan to stop metoprolol  one night ago. He did not take it today. He denies palpitations. He reports midodrine  worked well previously.     ROS: All other systems reviewed and are otherwise negative except as noted in History of  Present Illness.  EKGs/Labs Reviewed    EKG Interpretation Date/Time:  Thursday February 01 2024 08:26:21 EDT Ventricular Rate:  73 PR Interval:  152 QRS Duration:  78 QT Interval:  394 QTC Calculation: 434 R Axis:   76  Text Interpretation: Normal sinus rhythm Normal ECG When compared with ECG of 22-Jan-2024 05:06, PREVIOUS ECG IS PRESENT Confirmed by Loistine Sober [phone]) on 02/01/2024 8:39:49 AM   01/23/2024: ALT 23; AST 36; BUN 29; Creatinine, Ser 1.19; Potassium 4.1; Sodium 138   01/23/2024: Hemoglobin 10.3; WBC 7.8   07/12/2023: TSH 1.25    Physical Exam    VS:  BP 100/69   Pulse 73   Ht 6' (1.829 m)   Wt 116 lb 3.2 oz (52.7 kg)   SpO2 98%   BMI 15.76 kg/m  , BMI Body mass index is 15.76 kg/m.  Orthostatic VS for the past 24 hrs (Last 3 readings):  BP- Lying Pulse- Lying BP- Sitting Pulse- Sitting BP- Standing at 0 minutes Pulse- Standing at 0 minutes BP- Standing at 3 minutes Pulse- Standing at 3 minutes  02/01/24 0830 104/68 74 113/71 77 95/65  88 104/73 97     GEN: Very slender, well developed, in no acute distress. Neck: No JVD or carotid bruits. Cardiac:  RRR.  No murmur. No rubs or gallops.   Respiratory:  Respirations regular and unlabored. Clear to auscultation without rales, wheezing or rhonchi. GI: Soft, nontender, nondistended. Extremities: Radials/DP/PT 2+ and equal bilaterally. No clubbing or cyanosis. No edema   Skin: Warm and dry, no rash. Neuro: Strength intact.  Assessment & Plan   CAD S/p PCI with BMS to proximal LAD October 2006, DES to mid RCA March 2007. LHC May 2015 showed patent stents. No report of chest pain. EKG shows NSR.  - Continue aspirin , Plavix , atorvastatin , prn SL NTG.  Palpitations/PVCs  14-day ZIO May 2023 demonstrated frequent PVCs 8.4% burden.  Patient denies palpitations.  He was instructed by Dr. Gollan 1 day ago to stop metoprolol  secondary to low BP.  EKG shows NSR. - Okay to hold metoprolol  for now.  If patient  starts to experience palpitations and BP is elevated with midodrine  he can restart metoprolol  tartrate suspension.  Patient and wife agreeable to plan.  Orthostatic hypotension Patient reports shortness of breath upon standing with slight lightheadedness requiring him to stand for few minutes before it resolves.  He has been experiencing low BP 80-108/56-64 over the last 3 days. Intake BP 100/69.  He is mildly orthostatic with vitals today.  He has a long history of orthostasis that was managed well on midodrine .  Significant weight loss is likely a contributing factor.  He now has a PEG tube and is tolerating tube feeds. - Start midodrine  5 mg 3 times daily as needed for SBP <115.  Verified with Pharm.D. that midodrine  can be crushed and administered through PEG tube.  Disposition: Start midodrine  5 mg tid prn SBP <115. Return in 6 weeks or sooner as needed.          Signed, Barnie HERO. Shawn Eskelson, DNP, NP-C

## 2024-01-31 NOTE — Telephone Encounter (Signed)
 Called and spoke with the patient.  Patient advised of protocols for calling 911 or getting someone to take him to the emergency room.  In the meantime, patient open to having an office visit with Barnie Hila, NP.  Patient scheduled with Barnie on Thursday, 02/01/24 @ 0825.

## 2024-01-31 NOTE — Telephone Encounter (Signed)
 Orders in Dr. Sherrel office in box to sign.

## 2024-02-01 ENCOUNTER — Ambulatory Visit: Admitting: Physical Therapy

## 2024-02-01 ENCOUNTER — Ambulatory Visit: Attending: Student | Admitting: Student

## 2024-02-01 ENCOUNTER — Encounter: Admitting: Speech Pathology

## 2024-02-01 ENCOUNTER — Encounter: Admitting: Physical Therapy

## 2024-02-01 ENCOUNTER — Encounter: Payer: Self-pay | Admitting: Student

## 2024-02-01 VITALS — BP 100/69 | HR 73 | Ht 72.0 in | Wt 116.2 lb

## 2024-02-01 DIAGNOSIS — R0602 Shortness of breath: Secondary | ICD-10-CM | POA: Diagnosis not present

## 2024-02-01 DIAGNOSIS — I2510 Atherosclerotic heart disease of native coronary artery without angina pectoris: Secondary | ICD-10-CM | POA: Insufficient documentation

## 2024-02-01 MED ORDER — MIDODRINE HCL 5 MG PO TABS: 5.0000 mg | ORAL_TABLET | Freq: Three times a day (TID) | ORAL | 3 refills | Status: DC | PRN

## 2024-02-01 NOTE — Patient Instructions (Addendum)
 Medication Instructions:   Your physician recommends the following medication changes.  START TAKING: Midodrine  5 mg three times daily as needed for Systolic Blood Pressure (Top Number) less than 115  *If you need a refill on your cardiac medications before your next appointment, please call your pharmacy*  Lab Work:  None ordered at this time   If you have labs (blood work) drawn today and your tests are completely normal, you will receive your results only by:  MyChart Message (if you have MyChart) OR  A paper copy in the mail If you have any lab test that is abnormal or we need to change your treatment, we will call you to review the results.  Testing/Procedures:  None ordered at this time   Referrals:  None ordered at this time   Follow-Up:  At [facility], you and your health needs are our priority.  As part of our continuing mission to provide you with exceptional heart care, our providers are all part of one team.  This team includes your primary Cardiologist (physician) and Advanced Practice Providers or APPs (Physician Assistants and Nurse Practitioners) who all work together to provide you with the care you need, when you need it.  Your next appointment:   6 week(s) First week of December  Provider:    Evalene Lunger, MD or Barnie Hila, NP    We recommend signing up for the patient portal called MyChart.  Sign up information is provided on this After Visit Summary.  MyChart is used to connect with patients for Virtual Visits (Telemedicine).  Patients are able to view lab/test results, encounter notes, upcoming appointments, etc.  Non-urgent messages can be sent to your provider as well.   To learn more about what you can do with MyChart, go to [email or web address].   Other Instructions  Gastric (eg, NG, G-tube) or post-pyloric (eg, J-tube) tubes: Crush tablet(s) into a fine powder and disperse in 10 mL purified water  immediately prior to  administration; draw up mixture into enteral dosing syringe and administer via feeding tube (Ref).  General guidance: Hold enteral nutrition (EN) during midodrine  administration (Ref). Flush feeding tube with an appropriate volume of purified water  (eg, 15 mL) before administration (Ref). Following administration, rinse container used for preparation with purified water ; draw up rinse and administer contents to ensure delivery of entire dose (Ref). Flush feeding tube with an appropriate volume of purified water  (eg, 15 mL) and restart EN (Ref).

## 2024-02-02 NOTE — Telephone Encounter (Signed)
 Per the Referral notes, the referral to Nutrition was canceled.    Avelina Greig BRAVO, MD (Provider)Pennix, Katie S Type: Email-Provider responded to an internal email that was sent. She stated to close 01/24/24  01:49 PM

## 2024-02-02 NOTE — Telephone Encounter (Signed)
 ENT Referral was faxed to [facility] on 01/10/24

## 2024-02-05 ENCOUNTER — Encounter: Payer: Self-pay | Admitting: Family Medicine

## 2024-02-06 ENCOUNTER — Encounter: Payer: Self-pay | Admitting: Family

## 2024-02-06 ENCOUNTER — Encounter

## 2024-02-06 ENCOUNTER — Telehealth: Payer: Self-pay | Admitting: Speech Pathology

## 2024-02-06 ENCOUNTER — Ambulatory Visit: Admitting: Physical Therapy

## 2024-02-06 ENCOUNTER — Encounter: Admitting: Speech Pathology

## 2024-02-06 ENCOUNTER — Ambulatory Visit (INDEPENDENT_AMBULATORY_CARE_PROVIDER_SITE_OTHER): Admitting: Family

## 2024-02-06 ENCOUNTER — Encounter: Admitting: Physical Therapy

## 2024-02-06 VITALS — BP 118/72 | HR 82 | Temp 97.8°F | Ht 72.0 in | Wt 119.6 lb

## 2024-02-06 DIAGNOSIS — K9422 Gastrostomy infection: Secondary | ICD-10-CM | POA: Diagnosis not present

## 2024-02-06 DIAGNOSIS — L089 Local infection of the skin and subcutaneous tissue, unspecified: Secondary | ICD-10-CM | POA: Diagnosis not present

## 2024-02-06 DIAGNOSIS — Z931 Gastrostomy status: Secondary | ICD-10-CM

## 2024-02-06 DIAGNOSIS — E440 Moderate protein-calorie malnutrition: Secondary | ICD-10-CM

## 2024-02-06 DIAGNOSIS — G4733 Obstructive sleep apnea (adult) (pediatric): Secondary | ICD-10-CM

## 2024-02-06 MED ORDER — SULFAMETHOXAZOLE-TRIMETHOPRIM 800-160 MG PO TABS: 1.0000 | ORAL_TABLET | Freq: Two times a day (BID) | ORAL | 0 refills | Status: DC

## 2024-02-06 NOTE — Progress Notes (Signed)
 Established Patient Office Visit  Subjective:      CC:  Chief Complaint  Patient presents with   Acute Visit    Had a feeding tube placed 3 weeks ago and now he believes the site might be infected.    HPI: Shawn Lam is a 73 y.o. male presenting on 02/06/2024 for Acute Visit (Had a feeding tube placed 3 weeks ago and now he believes the site might be infected.) .  Discussed the use of AI scribe software for clinical note transcription with the patient, who gave verbal consent to proceed.  History of Present Illness  10/9 had a G tube placed at [facility]  Since then has noticed some redness around his site, but hard to see he has two 'buttons' that are pressing onto your insertion site causing tenderness. He has noticed some white liquid discharge. His wife has been checking the site daily. She is cleansing the site daily with a Q tip and water  as best they can.   No fever.  He had had this placed by Dr. Jenna as a result of dysphagia secondary to aspirtation as a complication of ACDF and also with significant weight loss.   He is working with a health and safety inspector as well. He is utilizing 6 tube feedings a day, he does not report leakage when administering the feeds. He does this about 2-3 hours.   Wt Readings from Last 3 Encounters:  02/06/24 119 lb 9.6 oz (54.3 kg)  02/01/24 116 lb 3.2 oz (52.7 kg)  01/26/24 114 lb 4 oz (51.8 kg)            Social history:  Relevant past medical, surgical, family and social history reviewed and updated as indicated. Interim medical history since our last visit reviewed.  Allergies and medications reviewed and updated.  DATA REVIEWED: CHART IN EPIC     ROS: Negative unless specifically indicated above in HPI.    Current Outpatient Medications:    aspirin  81 MG chewable tablet, Place 1 tablet (81 mg total) into feeding tube daily., Disp: , Rfl:    atorvastatin  (LIPITOR ) 80 MG tablet, Place 1 tablet (80 mg total) into feeding  tube daily., Disp: , Rfl:    baclofen (LIORESAL) 10 MG tablet, Place 1 tablet (10 mg total) into feeding tube 2 (two) times daily., Disp: , Rfl:    buPROPion  (WELLBUTRIN  XL) 150 MG 24 hr tablet, Take 2 tablets (300 mg total) by mouth daily., Disp: , Rfl:    busPIRone  (BUSPAR ) 10 MG tablet, Place 2 tablets (20 mg total) into feeding tube 3 (three) times daily., Disp: , Rfl:    Cholecalciferol  (VITAMIN D3) 50 MCG (2000 UT) TABS, Place 1 tablet (2,000 Units total) into feeding tube daily., Disp: , Rfl:    clopidogrel  (PLAVIX ) 75 MG tablet, Place 1 tablet (75 mg total) into feeding tube daily., Disp: , Rfl:    cyanocobalamin  1000 MCG tablet, Place 1 tablet (1,000 mcg total) into feeding tube daily., Disp: , Rfl:    denosumab  (PROLIA ) 60 MG/ML SOSY injection, Inject 60 mg into the skin every 6 (six) months. Do not send RX, order this from the office, Disp: , Rfl:    DULoxetine  (CYMBALTA ) 30 MG capsule, TAKE THREE CAPSULES BY MOUTH DAILY, Disp: 90 capsule, Rfl: 5   Erenumab-aooe (AIMOVIG) 140 MG/ML SOAJ, 140 mg every 28 (twenty-eight) days., Disp: , Rfl:    finasteride  (PROSCAR ) 5 MG tablet, Take 5 mg by mouth daily., Disp: , Rfl:  levothyroxine  (SYNTHROID ) 88 MCG tablet, Place 1 tablet (88 mcg total) into feeding tube daily before breakfast., Disp: , Rfl:    magnesium  oxide (MAG-OX) 400 (240 Mg) MG tablet, Place 1 tablet (400 mg total) into feeding tube daily., Disp: , Rfl:    metoprolol  tartrate (LOPRESSOR ) 25 mg/10 mL SUSP, Place 4 mLs (10 mg total) into feeding tube 2 (two) times daily., Disp: , Rfl:    midodrine  (PROAMATINE ) 5 MG tablet, Take 1 tablet (5 mg total) by mouth 3 (three) times daily as needed. For Systolic Blood Pressure (Top number) of less than 115, Disp: 270 tablet, Rfl: 3   nitroGLYCERIN  (NITROSTAT ) 0.4 MG SL tablet, Place 1 tablet (0.4 mg total) under the tongue every 5 (five) minutes as needed for chest pain., Disp: 10 tablet, Rfl: 1   Nutritional Supplements (FEEDING SUPPLEMENT,  OSMOLITE 1.5 CAL,) LIQD, Place 120 mLs into feeding tube 6 (six) times daily., Disp: , Rfl:    sulfamethoxazole -trimethoprim  (BACTRIM  DS) 800-160 MG tablet, Take 1 tablet by mouth 2 (two) times daily for 7 days., Disp: 14 tablet, Rfl: 0   triamcinolone  cream (KENALOG ) 0.1 %, APPLY 2 TOPICALLY DAILY , DO NOT APPLY ON DACE , GROIN AND UNDERARMS., Disp: 454 g, Rfl: 0   Water  For Irrigation, Sterile (FREE WATER ) SOLN, Place 100 mLs into feeding tube 6 (six) times daily., Disp: , Rfl:    zolpidem  (AMBIEN ) 10 MG tablet, Take 1 tablet (10 mg total) by mouth at bedtime as needed for sleep., Disp: 30 tablet, Rfl: 0   zonisamide  (ZONEGRAN ) 25 MG capsule, Take 1 capsule (25 mg total) by mouth daily., Disp: 4 capsule, Rfl: 0        Objective:        BP 118/72 (BP Location: Left Arm, Patient Position: Sitting)   Pulse 82   Temp 97.8 F (36.6 C) (Temporal)   Ht 6' (1.829 m)   Wt 119 lb 9.6 oz (54.3 kg)   SpO2 100%   BMI 16.22 kg/m   Physical Exam   Wt Readings from Last 3 Encounters:  02/06/24 119 lb 9.6 oz (54.3 kg)  02/01/24 116 lb 3.2 oz (52.7 kg)  01/26/24 114 lb 4 oz (51.8 kg)    Physical Exam Skin:    Comments: G tube with irritation erythema and slight crusting yellow to brown discharge surrounding tube insertion site. Sutures still in place as well.             Results   Assessment & Plan:   Assessment and Plan Assessment & Plan  Skin infection at gastrostomy tube site [facility]) Assessment & Plan: Wound culture ordered today pending Rx bactrim  800/160 mg po bid x 7 days Doxycycline  may interfere with absorption of tube feeds so chose bactrim  for possible MRSA  Reaching out to IR to see if f/u placement evaluation can be advised  Continue with speech therapy and home health visits  Advised to apply non adherant dressing between disc and skin for further protection  Continue to cleanse site daily with warm water   Please monitor site for worsening signs/symptoms of  infection to include: increasing redness, increasing tenderness, increase in size, and or pustulant drainage from site. If this is to occur please let me know immediately.    Orders: -     WOUND CULTURE -     Sulfamethoxazole -Trimethoprim ; Take 1 tablet by mouth 2 (two) times daily for 7 days.  Dispense: 14 tablet; Refill: 0  Return in about 1 week (around 02/13/2024) for f/u with Dr. Avelina for G tube infection .     Ginger Patrick, MSN, APRN, FNP-C [facility]

## 2024-02-06 NOTE — Telephone Encounter (Signed)
 This clinical research associate received multiple calls from pt's wife this morning. She mentions that pt feels his feeding tube site is infected and asks who they should contact with this information for treatment.   I recommended they reach out to pt's PCP. She voiced understanding.   Willy Pinkerton B. Rubbie, M.S., CCC-SLP, Tree Surgeon Certified Brain Injury Specialist [facility]  [facility] Office [phone] Ascom [phone] Fax [phone]

## 2024-02-06 NOTE — Assessment & Plan Note (Signed)
 Wound culture ordered today pending Rx bactrim  800/160 mg po bid x 7 days Doxycycline  may interfere with absorption of tube feeds so chose bactrim  for possible MRSA  Reaching out to IR to see if f/u placement evaluation can be advised  Continue with speech therapy and home health visits  Advised to apply non adherant dressing between disc and skin for further protection  Continue to cleanse site daily with warm water   Please monitor site for worsening signs/symptoms of infection to include: increasing redness, increasing tenderness, increase in size, and or pustulant drainage from site. If this is to occur please let me know immediately.

## 2024-02-07 ENCOUNTER — Encounter: Payer: Self-pay | Admitting: Family Medicine

## 2024-02-07 ENCOUNTER — Ambulatory Visit
Admission: RE | Admit: 2024-02-07 | Discharge: 2024-02-07 | Disposition: A | Discharge status: Home / Self Care | Source: Ambulatory Visit | Attending: Pulmonary Disease | Admitting: Pulmonary Disease

## 2024-02-07 DIAGNOSIS — I7121 Aneurysm of the ascending aorta, without rupture: Secondary | ICD-10-CM | POA: Diagnosis not present

## 2024-02-07 DIAGNOSIS — R9389 Abnormal findings on diagnostic imaging of other specified body structures: Secondary | ICD-10-CM | POA: Insufficient documentation

## 2024-02-07 DIAGNOSIS — J439 Emphysema, unspecified: Secondary | ICD-10-CM | POA: Diagnosis not present

## 2024-02-07 DIAGNOSIS — R918 Other nonspecific abnormal finding of lung field: Secondary | ICD-10-CM | POA: Diagnosis not present

## 2024-02-08 ENCOUNTER — Ambulatory Visit: Payer: Self-pay | Admitting: Family

## 2024-02-08 ENCOUNTER — Encounter: Admitting: Physical Therapy

## 2024-02-08 ENCOUNTER — Other Ambulatory Visit

## 2024-02-08 DIAGNOSIS — G43009 Migraine without aura, not intractable, without status migrainosus: Secondary | ICD-10-CM | POA: Diagnosis not present

## 2024-02-08 DIAGNOSIS — M7918 Myalgia, other site: Secondary | ICD-10-CM | POA: Diagnosis not present

## 2024-02-08 DIAGNOSIS — E440 Moderate protein-calorie malnutrition: Secondary | ICD-10-CM | POA: Diagnosis not present

## 2024-02-08 DIAGNOSIS — M5481 Occipital neuralgia: Secondary | ICD-10-CM | POA: Diagnosis not present

## 2024-02-08 DIAGNOSIS — R251 Tremor, unspecified: Secondary | ICD-10-CM | POA: Diagnosis not present

## 2024-02-08 DIAGNOSIS — Z125 Encounter for screening for malignant neoplasm of prostate: Secondary | ICD-10-CM | POA: Diagnosis not present

## 2024-02-08 DIAGNOSIS — N200 Calculus of kidney: Secondary | ICD-10-CM | POA: Diagnosis not present

## 2024-02-08 DIAGNOSIS — K9422 Gastrostomy infection: Secondary | ICD-10-CM

## 2024-02-08 NOTE — Progress Notes (Signed)
 noted

## 2024-02-09 ENCOUNTER — Encounter: Admitting: Speech Pathology

## 2024-02-09 ENCOUNTER — Ambulatory Visit: Admitting: Speech Pathology

## 2024-02-09 ENCOUNTER — Ambulatory Visit: Admitting: Physical Therapy

## 2024-02-09 ENCOUNTER — Ambulatory Visit

## 2024-02-09 ENCOUNTER — Ambulatory Visit: Payer: Self-pay

## 2024-02-09 ENCOUNTER — Other Ambulatory Visit: Payer: Self-pay | Admitting: Family Medicine

## 2024-02-09 ENCOUNTER — Ambulatory Visit: Payer: Self-pay | Admitting: Pulmonary Disease

## 2024-02-09 DIAGNOSIS — R2689 Other abnormalities of gait and mobility: Secondary | ICD-10-CM

## 2024-02-09 DIAGNOSIS — R1312 Dysphagia, oropharyngeal phase: Secondary | ICD-10-CM | POA: Diagnosis not present

## 2024-02-09 DIAGNOSIS — M542 Cervicalgia: Secondary | ICD-10-CM | POA: Diagnosis not present

## 2024-02-09 DIAGNOSIS — M546 Pain in thoracic spine: Secondary | ICD-10-CM | POA: Diagnosis not present

## 2024-02-09 DIAGNOSIS — R531 Weakness: Secondary | ICD-10-CM

## 2024-02-09 LAB — WOUND CULTURE
MICRO NUMBER:: 17157682
SPECIMEN QUALITY:: ADEQUATE

## 2024-02-09 MED ORDER — CEPHALEXIN 500 MG PO CAPS: 500.0000 mg | ORAL_CAPSULE | Freq: Three times a day (TID) | ORAL | 0 refills | Status: DC

## 2024-02-09 MED ORDER — CIPROFLOXACIN HCL 500 MG PO TABS: 500.0000 mg | ORAL_TABLET | Freq: Two times a day (BID) | ORAL | 0 refills | Status: DC

## 2024-02-09 NOTE — Therapy (Signed)
 OUTPATIENT SPEECH LANGUAGE PATHOLOGY  SWALLOW TREATMENT Re-evaluation d/p hospitalization   Patient Name: Shawn Lam MRN: [id number] DOB:04-12-1950, 73 y.o., male Today's Date: 02/09/2024  PCP: Greig Ring, MD REFERRING PROVIDER: Greig Ring, MD   End of Session - 02/09/24 1229     Visit Number 6    Number of Visits 25    Date for Recertification  03/25/24    Authorization Type Medicare Part A    Progress Note Due on Visit 10    SLP Start Time 0845    SLP Stop Time  0930    SLP Time Calculation (min) 45 min    Activity Tolerance Patient tolerated treatment well          Past Medical History:  Diagnosis Date   Actinic keratosis 02/04/2021   right lateral neck   Anemia    Anxiety    Asymptomatic LV dysfunction    50-55% by echo 06/2016   BCC (basal cell carcinoma of skin) 08/05/2020   left neck infra auricular - nodular pattern - treated with [facility] 09/17/2020   Berger's disease    CAD (coronary artery disease) 06/2005   PCI of LAD and RCA   Depression    Dilated aortic root    4cm at sinus of Valsalva   Hematuria    Hyperlipidemia    Hypothyroidism    Insomnia    Left ureteral stone    Orthostatic hypotension    negative tilt table   OSA (obstructive sleep apnea)    moderate with AHI 17/hr, uses CPAP nightly(01/15/19 - has Inspire implant)   PVC's (premature ventricular contractions) 07/07/2016   Renal disorder    PT IS SEEING DR. BETSEY- NEPHROLOGIST   Rosacea    Urticaria    Past Surgical History:  Procedure Laterality Date   ANTERIOR CERVICAL DECOMP/DISCECTOMY FUSION N/A 03/13/2023   Procedure: CERVICAL FOUR-FIVE, CERVICAL FIVE-SIX ANTERIOR CERVICAL DISCECTOMY/DECOMPRESSION FUSION;  Surgeon: Onetha Kuba, MD;  Location: [facility] OR;  Service: Neurosurgery;  Laterality: N/A;  3C   CARDIAC CATHETERIZATION  09/06/2013   [facility]   CARDIAC CATHETERIZATION     COLONOSCOPY WITH PROPOFOL  N/A 01/21/2019   Procedure: COLONOSCOPY WITH PROPOFOL ;  Surgeon: Jinny Carmine, MD;   Location: [facility];  Service: Endoscopy;  Laterality: N/A;  sleep apnea   COLONOSCOPY WITH PROPOFOL  N/A 10/19/2021   Procedure: COLONOSCOPY WITH PROPOFOL ;  Surgeon: Jinny Carmine, MD;  Location: [facility] ENDOSCOPY;  Service: Endoscopy;  Laterality: N/A;   CORONARY ANGIOPLASTY  2004   STENT PLACEMENT   CYSTOSCOPY WITH RETROGRADE PYELOGRAM, URETEROSCOPY AND STENT PLACEMENT Left 03/19/2014   Procedure: CYSTOSCOPY WITH RETROGRADE PYELOGRAM, URETEROSCOPY AND STENT PLACEMENT;  Surgeon: Mickey Ricardo KATHEE Alvaro, MD;  Location: [facility] ORS;  Service: Urology;  Laterality: Left;   CYSTOSCOPY WITH RETROGRADE PYELOGRAM, URETEROSCOPY AND STENT PLACEMENT Bilateral 05/20/2015   Procedure:  CYSTOSCOPY WITH BILATERAL RETROGRADE PYELOGRAM,RIGHT  DIAGNOSTIC URETEROSCOPY ,LEFT URETEROSCOPY WITH HOLMIUM LASER  AND BILATERAL  STENT PLACEMENT ;  Surgeon: Ricardo Alvaro, MD;  Location: [facility] ORS;  Service: Urology;  Laterality: Bilateral;   DRUG INDUCED ENDOSCOPY N/A 10/20/2017   Procedure: DRUG INDUCED SLEEP ENDOSCOPY;  Surgeon: Carlie Clark, MD;  Location: [facility];  Service: ENT;  Laterality: N/A;   EP IMPLANTABLE DEVICE N/A 05/13/2016   Procedure: Loop Recorder Insertion;  Surgeon: Elspeth JAYSON Sage, MD;  Location: [facility] INVASIVE CV LAB;  Service: Cardiovascular;  Laterality: N/A;   ESOPHAGOGASTRODUODENOSCOPY N/A 10/19/2021   Procedure: ESOPHAGOGASTRODUODENOSCOPY (EGD);  Surgeon: Jinny Carmine, MD;  Location: [facility] ENDOSCOPY;  Service: Endoscopy;  Laterality: N/A;   ESOPHAGOGASTRODUODENOSCOPY (EGD) WITH PROPOFOL  N/A 11/30/2018   Procedure: ESOPHAGOGASTRODUODENOSCOPY (EGD) WITH PROPOFOL ;  Surgeon: Therisa Bi, MD;  Location: [facility] ENDOSCOPY;  Service: Gastroenterology;  Laterality: N/A;   ESOPHAGOGASTRODUODENOSCOPY (EGD) WITH PROPOFOL  N/A 12/01/2018   Procedure: ESOPHAGOGASTRODUODENOSCOPY (EGD) WITH PROPOFOL ;  Surgeon: Jinny Carmine, MD;  Location: [facility] ENDOSCOPY;  Service: Endoscopy;  Laterality: N/A;   HOLMIUM LASER  APPLICATION Bilateral 05/20/2015   Procedure: HOLMIUM LASER APPLICATION;  Surgeon: Ricardo Likens, MD;  Location: [facility];  Service: Urology;  Laterality: Bilateral;   IMPLIMENTATION OF A HYPOGLOSSAL NERVE STIMULATOR  12/08/2017   OSA    IR GASTROSTOMY TUBE MOD SED  01/18/2024   LEFT HEART CATHETERIZATION WITH CORONARY ANGIOGRAM N/A 09/06/2013   Procedure: LEFT HEART CATHETERIZATION WITH CORONARY ANGIOGRAM;  Surgeon: Victory LELON Claudene DOUGLAS, MD;  Location: [facility] CATH LAB;  Service: Cardiovascular;  Laterality: N/A;   LEFT KNEE CAP  SURGERY ABOUT 40 YRS AGO  ? 1970     STONE EXTRACTION WITH BASKET Left 03/19/2014   Procedure: STONE EXTRACTION WITH BASKET;  Surgeon: Mickey Ricardo KATHEE Likens, MD;  Location: [facility]S;  Service: Urology;  Laterality: Left;   TRANSURETHRAL RESECTION OF PROSTATE N/A 08/25/2021   Procedure: TRANSURETHRAL RESECTION OF THE PROSTATE (TURP);  Surgeon: Likens Ricardo, MD;  Location: [facility];  Service: Urology;  Laterality: N/A;  1 HR   Patient Active Problem List   Diagnosis Date Noted   Skin infection at gastrostomy tube site (HCC) 02/06/2024   Protein-calorie malnutrition, severe 01/23/2024   Multifocal pneumonia 01/22/2024   Fall 01/03/2024   Hematoma of right hip 01/03/2024   Chronic pulmonary aspiration 12/29/2023   Silent aspiration 12/29/2023   Moderate protein-calorie malnutrition (weight for age 43-74% of standard) 12/29/2023   Bilateral leg weakness 12/29/2023   Chronic respiratory failure with hypoxia, on home oxygen  therapy (HCC) 12/29/2023   Chronic constipation 12/29/2023   Somnolence 12/29/2023   Frequent falls 12/29/2023   Feeding difficulties, unspecified 12/20/2023   Shortness of breath 12/20/2023   Abnormal CT of the chest 10/10/2023   Elevated blood sugar 06/30/2023   Carotid stenosis 05/11/2023   Spinal stenosis in cervical region 03/13/2023   Osteoarthritis of spine with radiculopathy, cervical region 07/14/2022   Cervicogenic headache 11/30/2021   Chronic neck  pain 11/30/2021   Weight loss, non-intentional    Dysphagia    Polyp of colon    Prostatic hyperplasia 08/25/2021   Chronic insomnia 03/19/2020   Peripheral neuropathy 03/19/2020   Iron deficiency anemia 01/06/2020   Allergic dermatitis 07/08/2019   Osteoarthritis of left knee 02/26/2019   History of colonic polyps    History of duodenal ulcer    CKD (chronic kidney disease), stage IIIa    Bilateral chronic knee pain 11/09/2018   Epistaxis 10/02/2018   Subjective tinnitus, bilateral 10/02/2018   Chronic pain of right thumb 02/22/2018   Acute bilateral thoracic back pain 01/23/2018   Osteoporosis without current pathological fracture 02/10/2017   Hypothyroidism 11/02/2016   Thoracic compression fracture (HCC) 09/06/2016   PVC's (premature ventricular contractions) 07/07/2016   Hypoalbuminemia 06/28/2016   IgA nephropathy 06/07/2016   Acute buttock pain 05/23/2016   MDD (major depressive disorder), recurrent episode, moderate (HCC) 11/20/2015   OSA (obstructive sleep apnea) 02/05/2014   Orthostatic hypotension 02/07/2013   Scotoma involving central area 01/29/2013   Optic neuropathy 01/28/2013   HEMATURIA UNSPECIFIED 01/21/2010   HLD (hyperlipidemia) 07/05/2006   Generalized anxiety disorder 07/05/2006  CAD (coronary artery disease) 07/05/2006   GERD 07/05/2006    ONSET DATE: 03/2023 following ACDF; Date of referral 12/28/2023: date of this referral 02/01/2024  REFERRING DIAG:  T17.908D (ICD-10-CM) - Chronic pulmonary aspiration, subsequent encounter  T17.900D (ICD-10-CM) - Silent aspiration, subsequent encounter  E44.0 (ICD-10-CM) - Moderate protein-calorie malnutrition  R13.10 (ICD-10-CM) - Dysphagia, unspecified type    THERAPY DIAG:  Dysphagia, oropharyngeal phase  Rationale for Evaluation and Treatment Rehabilitation  SUBJECTIVE:   PERTINENT HISTORY and DIAGNOSTIC FINDINGS:  Pt is a 73 yo male w/ ACDF surgery 03/2023.  PMH: GERD(Not on a PPI per chart),  Actinic keratosis (02/04/2021), Anemia, Anxiety, Asymptomatic LV dysfunction, BCC (basal cell carcinoma of skin) (08/05/2020), Berger's disease, CAD (coronary artery disease) (06/2005), resting tremor, Depression, Dilated aortic root (HCC), Hematuria, Hyperlipidemia, Hypothyroidism, Insomnia, Left ureteral stone, Orthostatic hypotension, OSA (obstructive sleep apnea), PVC's (premature ventricular contractions) (07/07/2016), Renal disorder, Rosacea, and Urticaria.  Pt is followed by Cardiology for Coronary Artery Stenosis currently; also followed for Migraines.     EGD in 2023 revealed Gastritis.      CT of Chest 10/2023: 1. Interim development of patchy consolidation and ground-glass   disease most evident at the right middle lobe and right lower lobes   with additional areas of mild ground-glass disease and scattered   clustered nodularity, findings are suspicious for multifocal   infection/pneumonia. Imaging follow-up to resolution is recommended.   2. Emphysema.   3. Aortic atherosclerosis.            Dysphagia - reported by pt as Chronic, ever since neck surgery in December 2024. Pt was seen in the emergency room s/p Fall, notes detailing that he slipped on soap in the shower, fell backwards on right side hit head and neck against shower seat, possible brief loss of consciousness, per chart.  ACDF 03/2023 per chart.  Since the neck surgery in 03/2023, he has noted food, liquids and pill getting stuck on the right side of my throat more.  I used to cough a lot more but not as much now.  I cough up what gets stuck..  He has lost significant weight per chart notes due to decreased eating.  Pt endorsed using his home O2 when he feels SOB.   MBSS 12/26/2023 Patient appears to present w/ Chronic, moderate-severe Pharyngeal phase dysphagia per this assessment today. It appeared there was some impact from changes in the Cervical Esophagus s/p ACDF surgery(03/2023 per chart notes) including  structural/tissue changes in/aournd the CP segment and cervical esophagus. Pt with intermittent silent aspiration of thin liquids.   Recommend continue current diet of easy to eat foods at home; thin liquids via cup- at this time. Oral care Before any oral intake. Swallow Strategies Recommended: No straws, moistened foods, SMALL bites/sips, Head Turn RIGHT w/ slight chin tuck, multiple swallows to aid clearing, AND strong Cough/throat clear b/t bites/sips and at end of meal.  D/t the Chronicity of pt's Pharyngeal phase Dysphagia w/ weight loss and pulmonary impact per recent Chest CT, pt and Family may need to discuss alternative means of nutrition/hydration as support w/ his Medical Team/PCP, while seeking swallow therapy.  Received ENT report dated 01/12/2024 Laryngoscopy revealed  Moderate vocal fold bowing bilaterally and moderate glottic gamp with phonation Impression/plan: Aspiration on MBSS and significant weight loss since ACDF last year. TVF shows moderate sized glottic gap on laryngoscoy but mobile vocal folds bilaterally. Pooling in pyriform on MBSS but no significant pooling of secretions on laryngoscopy. I feel that  between speech therpay and possibly bilateral injection laryngoplasty, hopefully his swallowing will significantly improve. Will make referral to Dr Brien at [facility].   Pt and his wife report appt with Dr Brien in January 2026.  10/10/205 PEG placed  01/22/2024 thru 01/24/2024 Hospitalized d/t fall and confusion CT of the chest on arrival showed multifocal pneumonia and possible free air. When reviewed further with interventional radiology team it was determined this was an appropriate amount of free air secondary to recent PEG tube placement. Patient was initiated on antibiotic therapy for pneumonia and improved significantly.   02/07/2024 CT Chest 1. Evolving multifocal inflammatory infiltrates, progressive within the right upper lobe and improved within the lung  bases bilaterally. Recommend appropriate clinical management and imaging follow-up per clinical course. 2. Mild emphysema. Given emphysema as an independent lung cancer risk factor, consider evaluation for a low-dose CT lung cancer screening program if the patient is 73 years old and otherwise eligible.   PAIN:  Are you having pain? No  FALLS: Has patient fallen in last 6 months?  See PT evaluation for details  LIVING ENVIRONMENT: Lives with: lives with their spouse Lives in: House/apartment  PLOF:  Level of assistance: Independent with ADLs, Independent with IADLs Employment: Retired   PATIENT GOALS  to improve swallow function, nutrition and hydration, decrease aspiration risks  SUBJECTIVE STATEMENT: Pt eager, wife attended the session Pt accompanied by: self, wife   OBJECTIVE:   TODAY'S TREATMENT:  Pt returns to outpatient ST services following recent hospitalization (see above). Pt and his wife report that he is up to 6 cartons with water  flushes throughout the day. Per chart review, pt has gained ~ 6 lbs. Pt continues to consumed medicine (that can't be crushed) whole in applesauce twice per day.   SLP provided verbal and written instructions on the following: Pt to complete 3 times per table while sitting at the table with his wife - no distractions.  Pt to consume 5 spoonfuls of thin water  utilizing head turn to right with chin tuck - pt able to demonstrate during session with moderate faded to supervision cues for head turn and chin tuck- no overt s/s of aspiration observed Pt to complete 5 repetitions of the [place] pharyngeal strengthening exercise (pt able to complete 3 repetitions during session) Pt to consume another 5 spoonfuls of thin water  utilizing head turn to right with chin tuck - Pt able to utilize this time with intermittent supervision cues - no overt s/s of aspiration observed Pt to complete another 5 repetitions of the [place] pharyngeal strengthening  exercise (pt able to complete 3 repetitions during session)  When consuming his morning and evening medicines - pt instructed to consume 10 boluses of pudding utilizing a double swallow. Pt benefited from intermittent moderate cues for double swallow. Pt with no overt s/s of aspiration.   Pt and his wife voiced understanding of the above. All questions answered to their satisfaction.   PATIENT EDUCATION: Education details: ST POC, repeat MBSS in 8 to 12 weeks Person educated: Patient and Spouse Education method: Chief Technology [employer] Education comprehension: needs further education  HOME EXERCISE PROGRAM:     See above   GOALS: Goals reviewed with patient? Yes  SHORT TERM GOALS: Target date: 10 sessions  Updated 02/09/2024 With Mod I, pt will perform compensatory swallow strategies (chin tuck, multiple swallows, head turn) to eliminate s/s of aspiration when consuming least restrictive diet. Baseline: Goal status: INITIAL: moderate A   2.  With Min  A, pt will complete (supraglottic, swallow, Mednelson maneuver, effortful swallow) to improve oral motor weakness, tongue base retraction, hyolarnygeal excursion, airway protection and/or clearance of the bolus through the pharynx. Baseline:  Goal status: INITIAL: progress made; moderate A  3. Pt will demonstrate overt s/s of aspiration when consuming ice chips.   Baseline: Goal status: INITIAL: no overt s/s of aspiration observed during today's session   LONG TERM GOALS: Target date: 03/25/2024  Updated: 02/09/2024 With Mod I, pt will demonstrate the ability to adequately self-monitor swallowing skills and perform appropriate compensatory techniques to reduce s/s of aspiration and to safely consume least restrictive diet.     Baseline:  Goal status: INITIAL: continues to be appropriate goal  2.  Pt will participate in repeat instrumental swallow evaluation to assess least restrictive diet.  Baseline:  Goal status: INITIAL:  continues to be appropriate goal   ASSESSMENT:  CLINICAL IMPRESSION: Patient is a 73 y.o. male who was seen today for a clinical swallow re-evaluation d/t severe pharyngeal phase dysphagia following ACDF (03/2023).    See the above treatment note for details.   OBJECTIVE IMPAIRMENTS include dysphagia. These impairments are limiting patient from safety when swallowing. Factors affecting potential to achieve goals and functional outcome are severity of impairments and malnutrition. Patient will benefit from skilled SLP services to address above impairments and improve overall function.  REHAB POTENTIAL: Good  PLAN: SLP FREQUENCY: 1-2x/week  SLP DURATION: 12 weeks  PLANNED INTERVENTIONS: Aspiration precaution training, Pharyngeal strengthening exercises, Diet toleration management , Oral motor exercises, SLP instruction and feedback, Compensatory strategies, and Patient/family education   Norbert Malkin B. Rubbie, M.S., CCC-SLP, Tree Surgeon Certified Brain Injury Specialist [facility]  [facility] Services Office [phone] Ascom [phone] Fax [phone]

## 2024-02-09 NOTE — Telephone Encounter (Signed)
 Left message to return call to office. Ok to [employer] below.

## 2024-02-09 NOTE — Therapy (Signed)
 OUTPATIENT PHYSICAL THERAPY TREATMENT/REASSESSMENT    Patient Name: Shawn Lam MRN: [id number] DOB:February 26, 1951, 73 y.o., male Today's Date: 02/09/2024  END OF SESSION:  PT End of Session - 02/09/24 0935     Visit Number 6    Number of Visits 24    Date for Recertification  03/13/24    Authorization Type Medicare 2025    Progress Note Due on Visit 10    PT Start Time 0918    PT Stop Time 0958    PT Time Calculation (min) 40 min    Activity Tolerance Patient tolerated treatment well;No increased pain    Behavior During Therapy [facility] for tasks assessed/performed           Past Medical History:  Diagnosis Date   Actinic keratosis 02/04/2021   right lateral neck   Anemia    Anxiety    Asymptomatic LV dysfunction    50-55% by echo 06/2016   BCC (basal cell carcinoma of skin) 08/05/2020   left neck infra auricular - nodular pattern - treated with [facility] 09/17/2020   Berger's disease    CAD (coronary artery disease) 06/2005   PCI of LAD and RCA   Depression    Dilated aortic root    4cm at sinus of Valsalva   Hematuria    Hyperlipidemia    Hypothyroidism    Insomnia    Left ureteral stone    Orthostatic hypotension    negative tilt table   OSA (obstructive sleep apnea)    moderate with AHI 17/hr, uses CPAP nightly(01/15/19 - has Inspire implant)   PVC's (premature ventricular contractions) 07/07/2016   Renal disorder    PT IS SEEING DR. BETSEY- NEPHROLOGIST   Rosacea    Urticaria    Past Surgical History:  Procedure Laterality Date   ANTERIOR CERVICAL DECOMP/DISCECTOMY FUSION N/A 03/13/2023   Procedure: CERVICAL FOUR-FIVE, CERVICAL FIVE-SIX ANTERIOR CERVICAL DISCECTOMY/DECOMPRESSION FUSION;  Surgeon: Onetha Kuba, MD;  Location: [facility] OR;  Service: Neurosurgery;  Laterality: N/A;  3C   CARDIAC CATHETERIZATION  09/06/2013   [facility]   CARDIAC CATHETERIZATION     COLONOSCOPY WITH PROPOFOL  N/A 01/21/2019   Procedure: COLONOSCOPY WITH PROPOFOL ;  Surgeon: Jinny Carmine, MD;   Location: [facility];  Service: Endoscopy;  Laterality: N/A;  sleep apnea   COLONOSCOPY WITH PROPOFOL  N/A 10/19/2021   Procedure: COLONOSCOPY WITH PROPOFOL ;  Surgeon: Jinny Carmine, MD;  Location: [facility] ENDOSCOPY;  Service: Endoscopy;  Laterality: N/A;   CORONARY ANGIOPLASTY  2004   STENT PLACEMENT   CYSTOSCOPY WITH RETROGRADE PYELOGRAM, URETEROSCOPY AND STENT PLACEMENT Left 03/19/2014   Procedure: CYSTOSCOPY WITH RETROGRADE PYELOGRAM, URETEROSCOPY AND STENT PLACEMENT;  Surgeon: Mickey Ricardo KATHEE Alvaro, MD;  Location: [facility] ORS;  Service: Urology;  Laterality: Left;   CYSTOSCOPY WITH RETROGRADE PYELOGRAM, URETEROSCOPY AND STENT PLACEMENT Bilateral 05/20/2015   Procedure:  CYSTOSCOPY WITH BILATERAL RETROGRADE PYELOGRAM,RIGHT  DIAGNOSTIC URETEROSCOPY ,LEFT URETEROSCOPY WITH HOLMIUM LASER  AND BILATERAL  STENT PLACEMENT ;  Surgeon: Ricardo Alvaro, MD;  Location: [facility] ORS;  Service: Urology;  Laterality: Bilateral;   DRUG INDUCED ENDOSCOPY N/A 10/20/2017   Procedure: DRUG INDUCED SLEEP ENDOSCOPY;  Surgeon: Carlie Clark, MD;  Location: [facility];  Service: ENT;  Laterality: N/A;   EP IMPLANTABLE DEVICE N/A 05/13/2016   Procedure: Loop Recorder Insertion;  Surgeon: Elspeth JAYSON Sage, MD;  Location: [facility] INVASIVE CV LAB;  Service: Cardiovascular;  Laterality: N/A;   ESOPHAGOGASTRODUODENOSCOPY N/A 10/19/2021   Procedure: ESOPHAGOGASTRODUODENOSCOPY (EGD);  Surgeon: Jinny Carmine, MD;  Location: [facility] ENDOSCOPY;  Service: Endoscopy;  Laterality: N/A;   ESOPHAGOGASTRODUODENOSCOPY (EGD) WITH PROPOFOL  N/A 11/30/2018   Procedure: ESOPHAGOGASTRODUODENOSCOPY (EGD) WITH PROPOFOL ;  Surgeon: Therisa Bi, MD;  Location: [facility] ENDOSCOPY;  Service: Gastroenterology;  Laterality: N/A;   ESOPHAGOGASTRODUODENOSCOPY (EGD) WITH PROPOFOL  N/A 12/01/2018   Procedure: ESOPHAGOGASTRODUODENOSCOPY (EGD) WITH PROPOFOL ;  Surgeon: Jinny Carmine, MD;  Location: [facility] ENDOSCOPY;  Service: Endoscopy;  Laterality: N/A;   HOLMIUM LASER  APPLICATION Bilateral 05/20/2015   Procedure: HOLMIUM LASER APPLICATION;  Surgeon: Ricardo Likens, MD;  Location: [facility] ORS;  Service: Urology;  Laterality: Bilateral;   IMPLIMENTATION OF A HYPOGLOSSAL NERVE STIMULATOR  12/08/2017   OSA    IR GASTROSTOMY TUBE MOD SED  01/18/2024   LEFT HEART CATHETERIZATION WITH CORONARY ANGIOGRAM N/A 09/06/2013   Procedure: LEFT HEART CATHETERIZATION WITH CORONARY ANGIOGRAM;  Surgeon: Victory LELON Claudene DOUGLAS, MD;  Location: [facility] CATH LAB;  Service: Cardiovascular;  Laterality: N/A;   LEFT KNEE CAP  SURGERY ABOUT 40 YRS AGO  ? 1970     STONE EXTRACTION WITH BASKET Left 03/19/2014   Procedure: STONE EXTRACTION WITH BASKET;  Surgeon: Mickey Ricardo KATHEE Likens, MD;  Location: [facility] ORS;  Service: Urology;  Laterality: Left;   TRANSURETHRAL RESECTION OF PROSTATE N/A 08/25/2021   Procedure: TRANSURETHRAL RESECTION OF THE PROSTATE (TURP);  Surgeon: Likens Ricardo, MD;  Location: [facility] ORS;  Service: Urology;  Laterality: N/A;  1 HR   Patient Active Problem List   Diagnosis Date Noted   Skin infection at gastrostomy tube site (HCC) 02/06/2024   Protein-calorie malnutrition, severe 01/23/2024   Multifocal pneumonia 01/22/2024   Fall 01/03/2024   Hematoma of right hip 01/03/2024   Chronic pulmonary aspiration 12/29/2023   Silent aspiration 12/29/2023   Moderate protein-calorie malnutrition (weight for age 29-74% of standard) 12/29/2023   Bilateral leg weakness 12/29/2023   Chronic respiratory failure with hypoxia, on home oxygen  therapy (HCC) 12/29/2023   Chronic constipation 12/29/2023   Somnolence 12/29/2023   Frequent falls 12/29/2023   Feeding difficulties, unspecified 12/20/2023   Shortness of breath 12/20/2023   Abnormal CT of the chest 10/10/2023   Elevated blood sugar 06/30/2023   Carotid stenosis 05/11/2023   Spinal stenosis in cervical region 03/13/2023   Osteoarthritis of spine with radiculopathy, cervical region 07/14/2022   Cervicogenic headache 11/30/2021   Chronic neck  pain 11/30/2021   Weight loss, non-intentional    Dysphagia    Polyp of colon    Prostatic hyperplasia 08/25/2021   Chronic insomnia 03/19/2020   Peripheral neuropathy 03/19/2020   Iron deficiency anemia 01/06/2020   Allergic dermatitis 07/08/2019   Osteoarthritis of left knee 02/26/2019   History of colonic polyps    History of duodenal ulcer    CKD (chronic kidney disease), stage IIIa    Bilateral chronic knee pain 11/09/2018   Epistaxis 10/02/2018   Subjective tinnitus, bilateral 10/02/2018   Chronic pain of right thumb 02/22/2018   Acute bilateral thoracic back pain 01/23/2018   Osteoporosis without current pathological fracture 02/10/2017   Hypothyroidism 11/02/2016   Thoracic compression fracture (HCC) 09/06/2016   PVC's (premature ventricular contractions) 07/07/2016   Hypoalbuminemia 06/28/2016   IgA nephropathy 06/07/2016   Acute buttock pain 05/23/2016   MDD (major depressive disorder), recurrent episode, moderate (HCC) 11/20/2015   OSA (obstructive sleep apnea) 02/05/2014   Orthostatic hypotension 02/07/2013   Scotoma involving central area 01/29/2013   Optic neuropathy 01/28/2013   HEMATURIA UNSPECIFIED 01/21/2010   HLD (hyperlipidemia) 07/05/2006   Generalized anxiety disorder 07/05/2006  CAD (coronary artery disease) 07/05/2006   GERD 07/05/2006    PCP: Dr. Greig Ring    REFERRING PROVIDER: Dr. Arley Helling   REFERRING DIAG: M54.6 (ICD-10-CM) - Acute bilateral thoracic back pain  THERAPY DIAG:  Pain in thoracic spine  Cervicalgia  Rationale for Evaluation and Treatment: Rehabilitation  ONSET DATE: May 2025    SUBJECTIVE:                                                                                                                                                                                                         SUBJECTIVE STATEMENT: Pt absent for 3 weeks, has been in hospital a few times. Neck/back pain is generally better with exacrbation a few  days weekly. Pt now has a feeding tube. Pt has re started midodrine  TID (contingent on home BP values). Pt having orthostatic dizziness and now having worse dypnea upon standing, 2 falls last night due to this. Pt has new order for PT resumption 02/01/24.   PERTINENT HISTORY:  73yoM referred to physical therapy for ongoing neck pain following cervical fusion in May 2020 and newly developed thoracic pain from T12 compression fracture that occurred sometime in Spring of 2025 (associated radicular pain pattern). Pt has pain localized to upper traps and suboccipitals. Pt attempted DC of prednisone  and gabapentin  two weeks prior to evaluation here, severe decline in function due to pain, barely able to walk for two weeks. He describes feeling increased fatigue and weakness to the point where he struggled to walk. Pt also reported repeated falls in the setting of orthostatic hypotension with him experiencing syncope when he stands up from a seated position. He describes feeling a burning sensation that is also localized to his scapulae and that it occurs when he is doing UE activities like washing his car that is likely the radicular pain from thoracic vertebrae compression fracture. PAIN:  Are you having pain? 0/10 pain at present.   PRECAUTIONS: None  WEIGHT BEARING RESTRICTIONS: No  FALLS:  Has patient fallen in last 6 months? Yes. Number of falls 6, pt reports that falls are happening in the context of syncope when standing up from a seated position (Orthostatic Hypotension) , where he blacks out and falls. He is following up with his PCP, Dr. Karlene who is working with him on stabilizing his blood pressure and she has recently taken him off Gabapentin  to see if this helps. Pt reports that he does note an improvement in his symptoms with less light headedness since going off medication.   LIVING ENVIRONMENT: Lives  with: lives with their spouse Lives in: House/apartment Stairs: No Has following  equipment at home: Single point cane and Environmental Consultant - 4 wheeled  OCCUPATION: Retired     PLOF: Independent  PATIENT GOALS: Relief of his neck pain while performing upper extremity activities.    NEXT MD VISIT: October 2025     OBJECTIVE:  Note: Objective measures were completed at evaluation unless otherwise noted.  DIAGNOSTIC FINDINGS:  CLINICAL DATA:  Back pain with occasional radicular symptoms.   EXAM: MRI THORACIC SPINE WITHOUT CONTRAST   TECHNIQUE: Multiplanar, multisequence MR imaging of the thoracic spine was performed. No intravenous contrast was administered.   COMPARISON:  MRI of the thoracic spine dated April 17, 2017.   FINDINGS: Alignment:  Physiologic.   Vertebrae: Chronic compression deformities of T7, T8, T9 and T12. There is retropulsion of the posterosuperior corner of T12 again demonstrated, resulting in mild-to-moderate central spinal canal stenosis, but no spinal cord impingement.   Cord:  Normal in morphology and signal intensity.   Paraspinal and other soft tissues: There are patchy, streaky opacities present in the posterior periphery of the lungs bilaterally the immediate paraspinal soft tissues are unremarkable.   Disc levels:   There is minimal posterior disc bulging present at T7-8 common T9-10 and T10-11, causing minimal central spinal canal stenosis at each level. There is mild-to-moderate central spinal canal stenosis at T12 secondary to retropulsion of the superior posterior corner of the vertebral body. Otherwise, on the spinal canal and neural foramina are widely patent throughout the thoracic region.   IMPRESSION: 1. Chronic compression deformities of T7, T8, T9 and T12, with mild-to-moderate central spinal canal stenosis at T12. There is no adverse interval change. 2. There are patchy, streaky peripheral opacities present posteriorly within the upper lung zones bilaterally, which could be better assessed with a CT of the  thorax.     Electronically Signed   By: Evalene Coho M.D.   On: 10/02/2023 17:14  Reassessment Measures 02/09/24: -eyes closed balance, normal stance: <5sec with retropulsion each time, ModA falls recovery  -narrow stance eyes open balance: <10sec with LOB, ModA falls recovery  -Long distance AMB assessment c SPC: 15ft with 2 DOE standing breaks ~15 seconds each, 15sec; crossover gait intermittently without LOB; -standing vitals taken at end of walk: stable and comparable to prior seated vitals.  -cervical rotation ROM: 50deg Rt, 53 degree left  -shoulder flexion MMT: 5/5 Rt, 4+/5 left; shoulder ABDCT MMT: 5/5 bilat  -ankle DF assessment: can perform >10x full range while leaning against wall (adequate for gait needs)  -sensory testing about ankles, lower legs: (feel sintact, but pt reports constance N/T below ankle to his toes)  TREATMENT DATE 02/09/24 :  -vital sign assessment: seated/ standing (midodrine  taken at 7:30a)  113/30mmHg 78bpm  seated  101/47mmHg 88bpm standing -NDI survey: 66% (unchanged) -AMB overground, no device x179ft: lateral hip instability, crossover, retropulsion with poor utility of reflexive righting strategy -eyes closed balance, normal stance: <5sec with retropulsion each time, ModA falls recovery  -narrow stance eyes open balance: <10sec with LOB, ModA falls recovery  -Long distance AMB assessment c SPC: 175ft with 2 DOE standing breaks ~15 seconds each, 15sec; crossover gait intermittently without LOB; -standing vitals taken at end of walk: stable and comparable to prior seated vitals.  -cervical rotation ROM: 50deg Rt, 53 degree left  -shoulder flexion MMT: 5/5 Rt, 4+/5 left; shoulder ABDCT MMT: 5/5 bilat  -ankle DF assessment: can perform >10x full range while  leaning against wall (adequate for gait needs)  -sensory testing about ankles, lower legs: (feel sintact, but pt reports constance N/T below ankle to his toes)    PATIENT  EDUCATION:  Education details: DC prior HEP; will get updated referral for weakness, imbalance.    Person educated: Patient Education method: Medical Illustrator Education comprehension: verbalized understanding, returned demonstration, and verbal cues required  HOME EXERCISE PROGRAM:  Need to create new HEP for balance once pt is ready. (Formally DC neck HEP on 02/09/24)   ASSESSMENT:  CLINICAL IMPRESSION: Pt returns after 3 week hiatus, more medical complications in the interim. Pt presenting with continued orthostasis, 2 falls last night despite starting new mediation for hypotension. Pt also having more general instability and frequent retropulsion LOB, compared to prior visit. He also mentions ongoing paresthesia in feet which is a new problem and concerning for nerve compression- pt FU with Dr. Onetha in 2 weeks. Pt has met most of his goals for back pain referral, Pt agrees he needs more help with instability and global weakness. I have asked PCP for updated referral so we can treat these areas as well. Patient will benefit from skilled physical therapy intervention to reduce deficits and impairments identified in evaluation, in order to reduce pain, improve quality of life, and maximize activity tolerance for ADL, IADL, and leisure/fitness. Physical therapy will help pt achieve long and short term goals of care.    OBJECTIVE IMPAIRMENTS: decreased balance, decreased ROM, decreased strength, hypomobility, impaired flexibility, impaired UE functional use, and pain.   ACTIVITY LIMITATIONS: carrying, lifting, standing, bathing, dressing, reach over head, and hygiene/grooming PARTICIPATION LIMITATIONS: cleaning, laundry, shopping, community activity, and yard work PERSONAL FACTORS: Age and 3+ comorbidities: CAD, Depression, are also affecting patient's functional outcome.  REHAB POTENTIAL: Good CLINICAL DECISION MAKING: Stable/uncomplicated EVALUATION COMPLEXITY:  Low   GOALS: Goals reviewed with patient? No  SHORT TERM GOALS: Target date: 01/03/2024  Patient will demonstrate undestanding of home exercise plan by performing exercises correctly with evidence of good carry over with min to no verbal or tactile cues .   Baseline: NT 12/25/23: Performing exercises independently  Goal status: ACHIEVED   2.  Patient will demonstrate understanding of how to modify exercises to decrease thoracic and cervical pain exacerbation for improved function and to decrease incidence of pain flare ups.  Baseline: Does not currently have strategy to manage this  Goal status: NOT MET      LONG TERM GOALS: Target date: 03/13/2024  Patient will show a statistically significant improvement in her neck function as evidence by >=7.5 pt decrease in her neck disability index score to better be able to move neck to improve visual field while driving to avoid accidents. Vernel et al, 2009) Baseline: 33/50 (66%); 02/09/24: 66% Goal status: NOT MET     2.  Patient will increase cervical mobility by  >=5 degrees AROM for all planes of motion (flexion, extension, side bending, and rotation) for improved cervical function and evidence of pain reduction. Baseline: Cervical AROM Flex 30, Ext 30, Rot R/L 40/40, SB R/L 35/35; 02/09/24: Rt rotation50 degrees, Left rotation: 53 degress    Goal status: MET    3.  Patient will improve shoulder and periscapular strength by 1/3 grade MMT (4- to 4) for improved cervical stability and UE function to carry out upper extremity activities like washing his car and gardening without being limited by pain and discomfort.  Baseline: Shoulder Flex R/L 4/4, Shoulder Abd R/L 4/4; 10/31: shoulder flexion  Rt: 5/5, left 4+/5; shoulder ABDCT 5/5 bilat  Goal status: MET    4.  Patient will reduce neck and thoracic pain to <=4/10 NRPS while performing UE activities like washing his car and doing yard work as evidence of improved cervical and thoracic function.   Baseline: 10/10 cervical paraspinal and rhomboid pain with UE activity; up to a 9/10 intermittently with activity   Goal status: NOT MET    PLAN:  PT FREQUENCY: 1-2x/week PT DURATION: 12 weeks PLANNED INTERVENTIONS: 97164- PT Re-evaluation, 97750- Physical Performance Testing, 97110-Therapeutic exercises, 97530- Therapeutic activity, [id number]- Neuromuscular re-education, 97535- Self Care, 02859- Manual therapy, [id number]- Gait training, [phone]- Canalith repositioning, [id number]- Aquatic Therapy, H9716- Electrical stimulation (unattended), [id number]- Electrical stimulation (manual), [id number]- Traction (mechanical), 20560 (1-2 muscles), 20561 (3+ muscles)- Dry Needling, Patient/Family education, Balance training, Joint mobilization, Joint manipulation, Spinal manipulation, Spinal mobilization, Vestibular training, DME instructions, Cryotherapy, and Moist heat  PLAN FOR NEXT SESSION:   Once new referral comes in, update goals, reassess for global weakness and imbalance.   9:43 AM, 02/09/24 Peggye JAYSON Linear, PT, DPT Physical Therapist - [facility]  Outpatient Physical Therapy- Main Campus [phone]

## 2024-02-09 NOTE — Telephone Encounter (Signed)
 Hey lindsay   Can we get into contact with IR who performed the placement of his G tube? They have not responded to my message I sent them. See if he can get in to see them since the site is infected   He had it placed with vascular and interventional radiology specialists [facility] and Dr. Cordella Banner)  Could you also call pt and ask how is the site?  The wound culture came back and it looks like cipro  would be a better option.  Can he swallow pills? I will send the pills. He can stop the bactrim  unless he has noted improvement with the antbx

## 2024-02-09 NOTE — Telephone Encounter (Signed)
 Please call patient Given Bactrim  is irritating him...  Have him stop this as well as any other antibiotics.  Have him start Keflex 500 mg 3 times a day through G-tube (crush and add water  as directed) for 7 days.  I tried to send in the Keflex prescription but for some reason it printed.  Can you call this and or try to send it through epic yourself.  Thank you.  Also please see other message regarding this patient.

## 2024-02-09 NOTE — Telephone Encounter (Signed)
 FYI Only or Action Required?: FYI only for provider: medication question answered.  Patient was last seen in primary care on 02/06/2024 by Corwin Antu, FNP.  Called Nurse Triage reporting Medication Reaction.  Symptoms began yesterday.  Interventions attempted: Rest, hydration, or home remedies.  Symptoms are: stable.  Triage Disposition: Information or Advice Only Call  Patient/caregiver understands and will follow disposition?: Yes  Copied from CRM [id number]. Topic: Clinical - Red Word Triage >> Feb 09, 2024  2:49 PM Alfonso HERO wrote: Red Word that prompted transfer to Nurse Triage: antibiotic is making patient sick. Wife concerned Reason for Disposition  Caller has medicine question, adult has minor symptoms, caller declines triage, AND triager answers question  Answer Assessment - Initial Assessment Questions Patient has thrown up his Bactrim  dose yesterday. Wife picked up his medications from the pharmacy and confused about why he has another abx ordered. Looking at communications with providers, they are switching to Cipro  as the culture was not sensitive to Bactrim . She understands and will give first dose of Cipro  tonight. He is not tolerating the Bactrim  any ways.  Wife asking about Cipro  administration. He takes some pills with applesauce or sips and the rest are crushed. It is a big pill and generally not advised to crush but if he is unable to swallow with sips then to crush it. No further needs at this time.   1. NAME of MEDICINE: What medicine(s) are you calling about?     Bactrim   2. QUESTION: What is your question? (e.g., double dose of medicine, side effect)     Is he supposed to be taking Bactrim  or Cipro  or both 3. PRESCRIBER: Who prescribed the medicine? Reason: if prescribed by specialist, call should be referred to that group.     Dugal/Bedsole 4. SYMPTOMS: Do you have any symptoms? If Yes, ask: What symptoms are you having?  How bad are the symptoms  (e.g., mild, moderate, severe)     Vomiting.  Protocols used: Medication Question Call-A-AH

## 2024-02-09 NOTE — Telephone Encounter (Unsigned)
 Copied from CRM [id number]. Topic: Clinical - Medication Question >> Feb 09, 2024  2:40 PM Brittany M wrote: Reason for CRM: sulfamethoxazole -trimethoprim  (BACTRIM  DS) 800-160 MG tablet is making patient sick- wanting to know if something different can be sent in.

## 2024-02-09 NOTE — Addendum Note (Signed)
 Addended by: AVELINA NO E on: 02/09/2024 04:24 PM   Modules accepted: Orders

## 2024-02-12 ENCOUNTER — Other Ambulatory Visit: Payer: Self-pay | Admitting: Student

## 2024-02-12 ENCOUNTER — Telehealth: Payer: Self-pay | Admitting: Family Medicine

## 2024-02-12 ENCOUNTER — Ambulatory Visit: Admitting: Dietician

## 2024-02-12 DIAGNOSIS — K9423 Gastrostomy malfunction: Secondary | ICD-10-CM

## 2024-02-12 NOTE — Telephone Encounter (Signed)
 Spoke with Delon at [employer]. She is going to get the pt in with a different PA as the one he originally saw is out of the office.

## 2024-02-12 NOTE — Telephone Encounter (Signed)
 Copied from CRM [id number]. Topic: Clinical - Lab/Test Results >> Feb 12, 2024 10:37 AM Zebedee SAUNDERS wrote: Reason for CRM: Received call from [facility] per [facility] ph: [phone] returning Lynsey's call  to get more information from her about the drain.

## 2024-02-12 NOTE — Telephone Encounter (Signed)
 Copied from CRM [id number]. Topic: Clinical - Medical Advice >> Feb 12, 2024  9:36 AM Ivette P wrote: Reason for CRM: pt does not know when he got his prolia  shot and would like to know when he needs to get his next shot.

## 2024-02-12 NOTE — Telephone Encounter (Addendum)
 His last prolia  shot was on 01/02/24. His next one should be in March 2026.  I attempted to contact the pt by phone x2, the line was picked up but no one answered the line.

## 2024-02-12 NOTE — Progress Notes (Signed)
 Patient for IR G-tube check on Tues 02/13/24 per Warren Dais, PA, I called and spoke with the patient on the phone and gave pre-procedure instructions. Pt was made aware to be here at 2:15p and check in at the H&V entrance. Pt stated understanding.  Called 02/12/24

## 2024-02-12 NOTE — Telephone Encounter (Signed)
 Called patient x 2 rang one time and no answer line was dead.

## 2024-02-12 NOTE — Telephone Encounter (Signed)
 Dr. Avelina,   Just to clarify everything because I called Cherene to make sure he wasn't confused as several encounters are open.   He is going to continue the cephalexin. I had advised him last week to stop bactrim  start cipro  but I believe you had already spoken to him to start cephalexin so I told him to continue with that (wound culture had came back warranting the change)  Manuelita was able to finally get in touch with [facility]. They got him in for an appt tomorrow at 230 pm. Do you want to still see him for the appt at 1120? He was curious.   Infection is still present, no bigger no worse.

## 2024-02-12 NOTE — Telephone Encounter (Signed)
 Called IR at [phone], I had to leave a message to have my call returned.  I attempted to contact the pt by phone, someone answered the line but no one answered. I will send a MyChart message to him.

## 2024-02-12 NOTE — Telephone Encounter (Unsigned)
 Copied from CRM [id number]. Topic: Clinical - Prescription Issue >> Feb 09, 2024  5:05 PM Shawn Lam wrote: Reason for CRM: Pt returning call regarding medication reaction. Relayed message to pt. Pt said prescription says 2 times a day and not 3 times a day as instructed by Dr. Avelina. Called [facility] to clarify. Pt were still on line but not on the phone when I clicked back over to verify that the med label said 2 times a day.

## 2024-02-13 ENCOUNTER — Ambulatory Visit: Admitting: Physical Therapy

## 2024-02-13 ENCOUNTER — Ambulatory Visit

## 2024-02-13 ENCOUNTER — Other Ambulatory Visit: Payer: Self-pay | Admitting: Family Medicine

## 2024-02-13 ENCOUNTER — Ambulatory Visit: Admitting: Family Medicine

## 2024-02-13 ENCOUNTER — Other Ambulatory Visit: Payer: Self-pay | Admitting: Student

## 2024-02-13 ENCOUNTER — Ambulatory Visit
Admission: RE | Admit: 2024-02-13 | Discharge: 2024-02-13 | Disposition: A | Discharge status: Home / Self Care | Source: Ambulatory Visit | Attending: Student | Admitting: Student

## 2024-02-13 ENCOUNTER — Encounter: Admitting: Speech Pathology

## 2024-02-13 ENCOUNTER — Ambulatory Visit: Admitting: Speech Pathology

## 2024-02-13 DIAGNOSIS — K9423 Gastrostomy malfunction: Secondary | ICD-10-CM

## 2024-02-13 HISTORY — PX: IR RADIOLOGIST EVAL & MGMT: IMG5224

## 2024-02-13 NOTE — Telephone Encounter (Signed)
 Yes prescription should be Keflex 500 mg per tube 3 times a day.

## 2024-02-13 NOTE — Progress Notes (Signed)
 Patient presents with concern of infection at gastrostomy tube site. Patient's wife present. Patient states insertion site of gastrostomy tube was reddened which he has been prescribed Cipro  for cellulitis of this area. Patient and wife report site is much improved since being on the antibiotic with significant decrease in redness around insertion site. Minor abrasion noted at 6 o'clock position where bumper sits on top of the skin. T-tacks  and all sutures removed per Dr Jenna. Patient tolerated well without complication. Maintain current plan of antibiotic per care team. Please re-consult IR if any new concerns arise.    Analisia Kingsford B Arvil Utz NP 02/13/2024 3:18 PM

## 2024-02-13 NOTE — Telephone Encounter (Signed)
 Refill request for Ambien  10mg . Last fill 01/16/24. Last OV 02/06/24 for skin infection.

## 2024-02-15 ENCOUNTER — Ambulatory Visit

## 2024-02-15 ENCOUNTER — Ambulatory Visit: Attending: Family Medicine | Admitting: Speech Pathology

## 2024-02-15 ENCOUNTER — Encounter: Admitting: Speech Pathology

## 2024-02-15 DIAGNOSIS — R2689 Other abnormalities of gait and mobility: Secondary | ICD-10-CM | POA: Diagnosis not present

## 2024-02-15 DIAGNOSIS — M6281 Muscle weakness (generalized): Secondary | ICD-10-CM | POA: Insufficient documentation

## 2024-02-15 DIAGNOSIS — M542 Cervicalgia: Secondary | ICD-10-CM | POA: Insufficient documentation

## 2024-02-15 DIAGNOSIS — R269 Unspecified abnormalities of gait and mobility: Secondary | ICD-10-CM | POA: Diagnosis not present

## 2024-02-15 DIAGNOSIS — M546 Pain in thoracic spine: Secondary | ICD-10-CM | POA: Diagnosis not present

## 2024-02-15 DIAGNOSIS — R1312 Dysphagia, oropharyngeal phase: Secondary | ICD-10-CM | POA: Insufficient documentation

## 2024-02-15 DIAGNOSIS — G4733 Obstructive sleep apnea (adult) (pediatric): Secondary | ICD-10-CM | POA: Diagnosis not present

## 2024-02-15 NOTE — Therapy (Signed)
 OUTPATIENT SPEECH LANGUAGE PATHOLOGY  SWALLOW TREATMENT    Patient Name: Shawn Lam MRN: [id number] DOB:30-Jun-1950, 73 y.o., male Today's Date: 02/15/2024  PCP: Greig Ring, MD REFERRING PROVIDER: Greig Ring, MD   End of Session - 02/15/24 0925     Visit Number 7    Number of Visits 25    Date for Recertification  03/25/24    Authorization Type Medicare Part A    Progress Note Due on Visit 10    SLP Start Time 0845    SLP Stop Time  0925    SLP Time Calculation (min) 40 min    Activity Tolerance Patient tolerated treatment well          Past Medical History:  Diagnosis Date   Actinic keratosis 02/04/2021   right lateral neck   Anemia    Anxiety    Asymptomatic LV dysfunction    50-55% by echo 06/2016   BCC (basal cell carcinoma of skin) 08/05/2020   left neck infra auricular - nodular pattern - treated with [facility] 09/17/2020   Berger's disease    CAD (coronary artery disease) 06/2005   PCI of LAD and RCA   Depression    Dilated aortic root    4cm at sinus of Valsalva   Hematuria    Hyperlipidemia    Hypothyroidism    Insomnia    Left ureteral stone    Orthostatic hypotension    negative tilt table   OSA (obstructive sleep apnea)    moderate with AHI 17/hr, uses CPAP nightly(01/15/19 - has Inspire implant)   PVC's (premature ventricular contractions) 07/07/2016   Renal disorder    PT IS SEEING DR. BETSEY- NEPHROLOGIST   Rosacea    Urticaria    Past Surgical History:  Procedure Laterality Date   ANTERIOR CERVICAL DECOMP/DISCECTOMY FUSION N/A 03/13/2023   Procedure: CERVICAL FOUR-FIVE, CERVICAL FIVE-SIX ANTERIOR CERVICAL DISCECTOMY/DECOMPRESSION FUSION;  Surgeon: Onetha Kuba, MD;  Location: [facility] OR;  Service: Neurosurgery;  Laterality: N/A;  3C   CARDIAC CATHETERIZATION  09/06/2013   [facility]   CARDIAC CATHETERIZATION     COLONOSCOPY WITH PROPOFOL  N/A 01/21/2019   Procedure: COLONOSCOPY WITH PROPOFOL ;  Surgeon: Jinny Carmine, MD;  Location: [facility];   Service: Endoscopy;  Laterality: N/A;  sleep apnea   COLONOSCOPY WITH PROPOFOL  N/A 10/19/2021   Procedure: COLONOSCOPY WITH PROPOFOL ;  Surgeon: Jinny Carmine, MD;  Location: [facility] ENDOSCOPY;  Service: Endoscopy;  Laterality: N/A;   CORONARY ANGIOPLASTY  2004   STENT PLACEMENT   CYSTOSCOPY WITH RETROGRADE PYELOGRAM, URETEROSCOPY AND STENT PLACEMENT Left 03/19/2014   Procedure: CYSTOSCOPY WITH RETROGRADE PYELOGRAM, URETEROSCOPY AND STENT PLACEMENT;  Surgeon: Mickey Ricardo KATHEE Alvaro, MD;  Location: [facility] ORS;  Service: Urology;  Laterality: Left;   CYSTOSCOPY WITH RETROGRADE PYELOGRAM, URETEROSCOPY AND STENT PLACEMENT Bilateral 05/20/2015   Procedure:  CYSTOSCOPY WITH BILATERAL RETROGRADE PYELOGRAM,RIGHT  DIAGNOSTIC URETEROSCOPY ,LEFT URETEROSCOPY WITH HOLMIUM LASER  AND BILATERAL  STENT PLACEMENT ;  Surgeon: Ricardo Alvaro, MD;  Location: [facility] ORS;  Service: Urology;  Laterality: Bilateral;   DRUG INDUCED ENDOSCOPY N/A 10/20/2017   Procedure: DRUG INDUCED SLEEP ENDOSCOPY;  Surgeon: Carlie Clark, MD;  Location: [facility];  Service: ENT;  Laterality: N/A;   EP IMPLANTABLE DEVICE N/A 05/13/2016   Procedure: Loop Recorder Insertion;  Surgeon: Elspeth JAYSON Sage, MD;  Location: [facility] INVASIVE CV LAB;  Service: Cardiovascular;  Laterality: N/A;   ESOPHAGOGASTRODUODENOSCOPY N/A 10/19/2021   Procedure: ESOPHAGOGASTRODUODENOSCOPY (EGD);  Surgeon: Jinny Carmine, MD;  Location: [facility]  ENDOSCOPY;  Service: Endoscopy;  Laterality: N/A;   ESOPHAGOGASTRODUODENOSCOPY (EGD) WITH PROPOFOL  N/A 11/30/2018   Procedure: ESOPHAGOGASTRODUODENOSCOPY (EGD) WITH PROPOFOL ;  Surgeon: Therisa Bi, MD;  Location: [facility] ENDOSCOPY;  Service: Gastroenterology;  Laterality: N/A;   ESOPHAGOGASTRODUODENOSCOPY (EGD) WITH PROPOFOL  N/A 12/01/2018   Procedure: ESOPHAGOGASTRODUODENOSCOPY (EGD) WITH PROPOFOL ;  Surgeon: Jinny Carmine, MD;  Location: [facility] ENDOSCOPY;  Service: Endoscopy;  Laterality: N/A;   HOLMIUM LASER APPLICATION Bilateral 05/20/2015    Procedure: HOLMIUM LASER APPLICATION;  Surgeon: Ricardo Likens, MD;  Location: [facility] ORS;  Service: Urology;  Laterality: Bilateral;   IMPLIMENTATION OF A HYPOGLOSSAL NERVE STIMULATOR  12/08/2017   OSA    IR GASTROSTOMY TUBE MOD SED  01/18/2024   IR RADIOLOGIST EVAL & MGMT  02/13/2024   LEFT HEART CATHETERIZATION WITH CORONARY ANGIOGRAM N/A 09/06/2013   Procedure: LEFT HEART CATHETERIZATION WITH CORONARY ANGIOGRAM;  Surgeon: Victory LELON Claudene DOUGLAS, MD;  Location: [facility] CATH LAB;  Service: Cardiovascular;  Laterality: N/A;   LEFT KNEE CAP  SURGERY ABOUT 40 YRS AGO  ? 1970     STONE EXTRACTION WITH BASKET Left 03/19/2014   Procedure: STONE EXTRACTION WITH BASKET;  Surgeon: Mickey Ricardo KATHEE Likens, MD;  Location: [facility] ORS;  Service: Urology;  Laterality: Left;   TRANSURETHRAL RESECTION OF PROSTATE N/A 08/25/2021   Procedure: TRANSURETHRAL RESECTION OF THE PROSTATE (TURP);  Surgeon: Likens Ricardo, MD;  Location: [facility] ORS;  Service: Urology;  Laterality: N/A;  1 HR   Patient Active Problem List   Diagnosis Date Noted   Skin infection at gastrostomy tube site (HCC) 02/06/2024   Protein-calorie malnutrition, severe 01/23/2024   Multifocal pneumonia 01/22/2024   Fall 01/03/2024   Hematoma of right hip 01/03/2024   Chronic pulmonary aspiration 12/29/2023   Silent aspiration 12/29/2023   Moderate protein-calorie malnutrition (weight for age 3-74% of standard) 12/29/2023   Bilateral leg weakness 12/29/2023   Chronic respiratory failure with hypoxia, on home oxygen  therapy (HCC) 12/29/2023   Chronic constipation 12/29/2023   Somnolence 12/29/2023   Frequent falls 12/29/2023   Feeding difficulties, unspecified 12/20/2023   Shortness of breath 12/20/2023   Abnormal CT of the chest 10/10/2023   Elevated blood sugar 06/30/2023   Carotid stenosis 05/11/2023   Spinal stenosis in cervical region 03/13/2023   Osteoarthritis of spine with radiculopathy, cervical region 07/14/2022   Cervicogenic headache 11/30/2021    Chronic neck pain 11/30/2021   Weight loss, non-intentional    Dysphagia    Polyp of colon    Prostatic hyperplasia 08/25/2021   Chronic insomnia 03/19/2020   Peripheral neuropathy 03/19/2020   Iron deficiency anemia 01/06/2020   Allergic dermatitis 07/08/2019   Osteoarthritis of left knee 02/26/2019   History of colonic polyps    History of duodenal ulcer    CKD (chronic kidney disease), stage IIIa    Bilateral chronic knee pain 11/09/2018   Epistaxis 10/02/2018   Subjective tinnitus, bilateral 10/02/2018   Chronic pain of right thumb 02/22/2018   Acute bilateral thoracic back pain 01/23/2018   Osteoporosis without current pathological fracture 02/10/2017   Hypothyroidism 11/02/2016   Thoracic compression fracture (HCC) 09/06/2016   PVC's (premature ventricular contractions) 07/07/2016   Hypoalbuminemia 06/28/2016   IgA nephropathy 06/07/2016   Acute buttock pain 05/23/2016   MDD (major depressive disorder), recurrent episode, moderate (HCC) 11/20/2015   OSA (obstructive sleep apnea) 02/05/2014   Orthostatic hypotension 02/07/2013   Scotoma involving central area 01/29/2013   Optic neuropathy 01/28/2013   HEMATURIA UNSPECIFIED 01/21/2010   HLD (hyperlipidemia) 07/05/2006  Generalized anxiety disorder 07/05/2006   CAD (coronary artery disease) 07/05/2006   GERD 07/05/2006    ONSET DATE: 03/2023 following ACDF; Date of referral 12/28/2023: date of this referral 02/01/2024  REFERRING DIAG:  T17.908D (ICD-10-CM) - Chronic pulmonary aspiration, subsequent encounter  T17.900D (ICD-10-CM) - Silent aspiration, subsequent encounter  E44.0 (ICD-10-CM) - Moderate protein-calorie malnutrition  R13.10 (ICD-10-CM) - Dysphagia, unspecified type    THERAPY DIAG:  Dysphagia, oropharyngeal phase  Rationale for Evaluation and Treatment Rehabilitation  SUBJECTIVE:   PERTINENT HISTORY and DIAGNOSTIC FINDINGS:  Pt is a 74 yo male w/ ACDF surgery 03/2023.  PMH: GERD(Not on a PPI per  chart), Actinic keratosis (02/04/2021), Anemia, Anxiety, Asymptomatic LV dysfunction, BCC (basal cell carcinoma of skin) (08/05/2020), Berger's disease, CAD (coronary artery disease) (06/2005), resting tremor, Depression, Dilated aortic root (HCC), Hematuria, Hyperlipidemia, Hypothyroidism, Insomnia, Left ureteral stone, Orthostatic hypotension, OSA (obstructive sleep apnea), PVC's (premature ventricular contractions) (07/07/2016), Renal disorder, Rosacea, and Urticaria.  Pt is followed by Cardiology for Coronary Artery Stenosis currently; also followed for Migraines.     EGD in 2023 revealed Gastritis.      CT of Chest 10/2023: 1. Interim development of patchy consolidation and ground-glass   disease most evident at the right middle lobe and right lower lobes   with additional areas of mild ground-glass disease and scattered   clustered nodularity, findings are suspicious for multifocal   infection/pneumonia. Imaging follow-up to resolution is recommended.   2. Emphysema.   3. Aortic atherosclerosis.            Dysphagia - reported by pt as Chronic, ever since neck surgery in December 2024. Pt was seen in the emergency room s/p Fall, notes detailing that he slipped on soap in the shower, fell backwards on right side hit head and neck against shower seat, possible brief loss of consciousness, per chart.  ACDF 03/2023 per chart.  Since the neck surgery in 03/2023, he has noted food, liquids and pill getting stuck on the right side of my throat more.  I used to cough a lot more but not as much now.  I cough up what gets stuck..  He has lost significant weight per chart notes due to decreased eating.  Pt endorsed using his home O2 when he feels SOB.   MBSS 12/26/2023 Patient appears to present w/ Chronic, moderate-severe Pharyngeal phase dysphagia per this assessment today. It appeared there was some impact from changes in the Cervical Esophagus s/p ACDF surgery(03/2023 per chart notes) including  structural/tissue changes in/aournd the CP segment and cervical esophagus. Pt with intermittent silent aspiration of thin liquids.   Recommend continue current diet of easy to eat foods at home; thin liquids via cup- at this time. Oral care Before any oral intake. Swallow Strategies Recommended: No straws, moistened foods, SMALL bites/sips, Head Turn RIGHT w/ slight chin tuck, multiple swallows to aid clearing, AND strong Cough/throat clear b/t bites/sips and at end of meal.  D/t the Chronicity of pt's Pharyngeal phase Dysphagia w/ weight loss and pulmonary impact per recent Chest CT, pt and Family may need to discuss alternative means of nutrition/hydration as support w/ his Medical Team/PCP, while seeking swallow therapy.  Received ENT report dated 01/12/2024 Laryngoscopy revealed  Moderate vocal fold bowing bilaterally and moderate glottic gamp with phonation Impression/plan: Aspiration on MBSS and significant weight loss since ACDF last year. TVF shows moderate sized glottic gap on laryngoscoy but mobile vocal folds bilaterally. Pooling in pyriform on MBSS but no significant pooling of  secretions on laryngoscopy. I feel that between speech therpay and possibly bilateral injection laryngoplasty, hopefully his swallowing will significantly improve. Will make referral to Dr Brien at [facility].   Pt and his wife report appt with Dr Brien in January 2026.  10/10/205 PEG placed  01/22/2024 thru 01/24/2024 Hospitalized d/t fall and confusion CT of the chest on arrival showed multifocal pneumonia and possible free air. When reviewed further with interventional radiology team it was determined this was an appropriate amount of free air secondary to recent PEG tube placement. Patient was initiated on antibiotic therapy for pneumonia and improved significantly.   02/07/2024 CT Chest 1. Evolving multifocal inflammatory infiltrates, progressive within the right upper lobe and improved within the lung  bases bilaterally. Recommend appropriate clinical management and imaging follow-up per clinical course. 2. Mild emphysema. Given emphysema as an independent lung cancer risk factor, consider evaluation for a low-dose CT lung cancer screening program if the patient is 73 years old and otherwise eligible.   PAIN:  Are you having pain? No  FALLS: Has patient fallen in last 6 months?  See PT evaluation for details  LIVING ENVIRONMENT: Lives with: lives with their spouse Lives in: House/apartment  PLOF:  Level of assistance: Independent with ADLs, Independent with IADLs Employment: Retired   PATIENT GOALS  to improve swallow function, nutrition and hydration, decrease aspiration risks  SUBJECTIVE STATEMENT: Pt eager, wife attended the session Pt accompanied by: self, wife   OBJECTIVE:   TODAY'S TREATMENT:  Skilled treatment session focused on pt's dysphagia goals. SLP facilitated the session by providing the following interventions:   Pt reports good adherence with HEP and had follow up with IR and stitches removed.   Skilled verbal instructions provided on pharyngeal strengthening exercises (as below).  Masako: Pt able to complete 1 set of 8 repetitions with supervision cues. Pt consumed 10 spoonfuls of thin water  utilizing head turn to right with chin tuck -  pt able to demonstrate during session with moderate faded to supervision cues for head turn and chin tuck- pt with throat clear after each swallow observed Masako: Pt able to complete 1 set of 4 repetitions, unable to complete full set of 8 d/t report of dry mouth. Pt consumed another 10 spoonfuls of thin water  utilizing head turn to right with chin tuck - Pt able to utilize this time with intermittent supervision cues - observed decrease in throat clear  Chin tuck against resistance (CTAR): pt able to complete 2 sets of 5 repetitions Pt consumed about 5 spoonfuls of applesauce with head turn, chin tuck, and double  swallow. Pt with no overt s/s of aspiration.  Pt instructed to complete 2 sets of 8 repetitions of the masako and 2 sets of 5 repetitions of the CTAR during HEP. Pt and his wife voiced understanding of the above.   PATIENT EDUCATION: Education details: ST POC, repeat MBSS in 8 to 12 weeks Person educated: Patient and Spouse Education method: Chief Technology Officer Education comprehension: needs further education  HOME EXERCISE PROGRAM:     See above   GOALS: Goals reviewed with patient? Yes  SHORT TERM GOALS: Target date: 10 sessions  Updated 02/09/2024 With Mod I, pt will perform compensatory swallow strategies (chin tuck, multiple swallows, head turn) to eliminate s/s of aspiration when consuming least restrictive diet. Baseline: Goal status: INITIAL: moderate A   2.  With Min A, pt will complete (supraglottic, swallow, Mednelson maneuver, effortful swallow) to improve oral motor weakness, tongue base retraction,  hyolarnygeal excursion, airway protection and/or clearance of the bolus through the pharynx. Baseline:  Goal status: INITIAL: progress made; moderate A  3. Pt will demonstrate overt s/s of aspiration when consuming ice chips.   Baseline: Goal status: INITIAL: no overt s/s of aspiration observed during today's session   LONG TERM GOALS: Target date: 03/25/2024  Updated: 02/09/2024 With Mod I, pt will demonstrate the ability to adequately self-monitor swallowing skills and perform appropriate compensatory techniques to reduce s/s of aspiration and to safely consume least restrictive diet.     Baseline:  Goal status: INITIAL: continues to be appropriate goal  2.  Pt will participate in repeat instrumental swallow evaluation to assess least restrictive diet.  Baseline:  Goal status: INITIAL: continues to be appropriate goal   ASSESSMENT:  CLINICAL IMPRESSION: Patient is a 73 y.o. male who was seen today for a clinical swallow re-evaluation d/t severe pharyngeal  phase dysphagia following ACDF (03/2023).    See the above treatment note for details.   OBJECTIVE IMPAIRMENTS include dysphagia. These impairments are limiting patient from safety when swallowing. Factors affecting potential to achieve goals and functional outcome are severity of impairments and malnutrition. Patient will benefit from skilled SLP services to address above impairments and improve overall function.  REHAB POTENTIAL: Good  PLAN: SLP FREQUENCY: 1-2x/week  SLP DURATION: 12 weeks  PLANNED INTERVENTIONS: Aspiration precaution training, Pharyngeal strengthening exercises, Diet toleration management , Oral motor exercises, SLP instruction and feedback, Compensatory strategies, and Patient/family education   Happi B. Rubbie, M.S., CCC-SLP, Tree Surgeon Certified Brain Injury Specialist [facility]  [facility] Rehabilitation Services Office [phone] Ascom [phone] Fax [phone]

## 2024-02-15 NOTE — Therapy (Signed)
 OUTPATIENT PHYSICAL THERAPY TREATMENT  Patient Name: Shawn Lam MRN: [id number] DOB:02-Oct-1950, 73 y.o., male Today's Date: 02/15/2024  END OF SESSION:  PT End of Session - 02/15/24 0940     Visit Number 7    Number of Visits 24    Date for Recertification  03/13/24    Authorization Type Medicare 2025    Progress Note Due on Visit 10    PT Start Time 0931    PT Stop Time 1011    PT Time Calculation (min) 40 min    Activity Tolerance Patient tolerated treatment well;No increased pain    Behavior During Therapy [facility] for tasks assessed/performed           Past Medical History:  Diagnosis Date   Actinic keratosis 02/04/2021   right lateral neck   Anemia    Anxiety    Asymptomatic LV dysfunction    50-55% by echo 06/2016   BCC (basal cell carcinoma of skin) 08/05/2020   left neck infra auricular - nodular pattern - treated with [facility] 09/17/2020   Berger's disease    CAD (coronary artery disease) 06/2005   PCI of LAD and RCA   Depression    Dilated aortic root    4cm at sinus of Valsalva   Hematuria    Hyperlipidemia    Hypothyroidism    Insomnia    Left ureteral stone    Orthostatic hypotension    negative tilt table   OSA (obstructive sleep apnea)    moderate with AHI 17/hr, uses CPAP nightly(01/15/19 - has Inspire implant)   PVC's (premature ventricular contractions) 07/07/2016   Renal disorder    PT IS SEEING DR. BETSEY- NEPHROLOGIST   Rosacea    Urticaria    Past Surgical History:  Procedure Laterality Date   ANTERIOR CERVICAL DECOMP/DISCECTOMY FUSION N/A 03/13/2023   Procedure: CERVICAL FOUR-FIVE, CERVICAL FIVE-SIX ANTERIOR CERVICAL DISCECTOMY/DECOMPRESSION FUSION;  Surgeon: Onetha Kuba, MD;  Location: [facility] OR;  Service: Neurosurgery;  Laterality: N/A;  3C   CARDIAC CATHETERIZATION  09/06/2013   [facility]   CARDIAC CATHETERIZATION     COLONOSCOPY WITH PROPOFOL  N/A 01/21/2019   Procedure: COLONOSCOPY WITH PROPOFOL ;  Surgeon: Jinny Carmine, MD;  Location: [facility]  SURGERY CNTR;  Service: Endoscopy;  Laterality: N/A;  sleep apnea   COLONOSCOPY WITH PROPOFOL  N/A 10/19/2021   Procedure: COLONOSCOPY WITH PROPOFOL ;  Surgeon: Jinny Carmine, MD;  Location: [facility] ENDOSCOPY;  Service: Endoscopy;  Laterality: N/A;   CORONARY ANGIOPLASTY  2004   STENT PLACEMENT   CYSTOSCOPY WITH RETROGRADE PYELOGRAM, URETEROSCOPY AND STENT PLACEMENT Left 03/19/2014   Procedure: CYSTOSCOPY WITH RETROGRADE PYELOGRAM, URETEROSCOPY AND STENT PLACEMENT;  Surgeon: Mickey Ricardo KATHEE Alvaro, MD;  Location: [facility] ORS;  Service: Urology;  Laterality: Left;   CYSTOSCOPY WITH RETROGRADE PYELOGRAM, URETEROSCOPY AND STENT PLACEMENT Bilateral 05/20/2015   Procedure:  CYSTOSCOPY WITH BILATERAL RETROGRADE PYELOGRAM,RIGHT  DIAGNOSTIC URETEROSCOPY ,LEFT URETEROSCOPY WITH HOLMIUM LASER  AND BILATERAL  STENT PLACEMENT ;  Surgeon: Ricardo Alvaro, MD;  Location: [facility] ORS;  Service: Urology;  Laterality: Bilateral;   DRUG INDUCED ENDOSCOPY N/A 10/20/2017   Procedure: DRUG INDUCED SLEEP ENDOSCOPY;  Surgeon: Carlie Clark, MD;  Location: [facility];  Service: ENT;  Laterality: N/A;   EP IMPLANTABLE DEVICE N/A 05/13/2016   Procedure: Loop Recorder Insertion;  Surgeon: Elspeth JAYSON Sage, MD;  Location: [facility] INVASIVE CV LAB;  Service: Cardiovascular;  Laterality: N/A;   ESOPHAGOGASTRODUODENOSCOPY N/A 10/19/2021   Procedure: ESOPHAGOGASTRODUODENOSCOPY (EGD);  Surgeon: Jinny Carmine, MD;  Location: [facility]  ENDOSCOPY;  Service: Endoscopy;  Laterality: N/A;   ESOPHAGOGASTRODUODENOSCOPY (EGD) WITH PROPOFOL  N/A 11/30/2018   Procedure: ESOPHAGOGASTRODUODENOSCOPY (EGD) WITH PROPOFOL ;  Surgeon: Therisa Bi, MD;  Location: [facility] ENDOSCOPY;  Service: Gastroenterology;  Laterality: N/A;   ESOPHAGOGASTRODUODENOSCOPY (EGD) WITH PROPOFOL  N/A 12/01/2018   Procedure: ESOPHAGOGASTRODUODENOSCOPY (EGD) WITH PROPOFOL ;  Surgeon: Jinny Carmine, MD;  Location: [facility] ENDOSCOPY;  Service: Endoscopy;  Laterality: N/A;   HOLMIUM LASER APPLICATION Bilateral  05/20/2015   Procedure: HOLMIUM LASER APPLICATION;  Surgeon: Ricardo Likens, MD;  Location: [facility] ORS;  Service: Urology;  Laterality: Bilateral;   IMPLIMENTATION OF A HYPOGLOSSAL NERVE STIMULATOR  12/08/2017   OSA    IR GASTROSTOMY TUBE MOD SED  01/18/2024   IR RADIOLOGIST EVAL & MGMT  02/13/2024   LEFT HEART CATHETERIZATION WITH CORONARY ANGIOGRAM N/A 09/06/2013   Procedure: LEFT HEART CATHETERIZATION WITH CORONARY ANGIOGRAM;  Surgeon: Victory LELON Claudene DOUGLAS, MD;  Location: [facility] CATH LAB;  Service: Cardiovascular;  Laterality: N/A;   LEFT KNEE CAP  SURGERY ABOUT 40 YRS AGO  ? 1970     STONE EXTRACTION WITH BASKET Left 03/19/2014   Procedure: STONE EXTRACTION WITH BASKET;  Surgeon: Mickey Ricardo KATHEE Likens, MD;  Location: [facility] ORS;  Service: Urology;  Laterality: Left;   TRANSURETHRAL RESECTION OF PROSTATE N/A 08/25/2021   Procedure: TRANSURETHRAL RESECTION OF THE PROSTATE (TURP);  Surgeon: Likens Ricardo, MD;  Location: [facility] ORS;  Service: Urology;  Laterality: N/A;  1 HR   Patient Active Problem List   Diagnosis Date Noted   Skin infection at gastrostomy tube site (HCC) 02/06/2024   Protein-calorie malnutrition, severe 01/23/2024   Multifocal pneumonia 01/22/2024   Fall 01/03/2024   Hematoma of right hip 01/03/2024   Chronic pulmonary aspiration 12/29/2023   Silent aspiration 12/29/2023   Moderate protein-calorie malnutrition (weight for age 19-74% of standard) 12/29/2023   Bilateral leg weakness 12/29/2023   Chronic respiratory failure with hypoxia, on home oxygen  therapy (HCC) 12/29/2023   Chronic constipation 12/29/2023   Somnolence 12/29/2023   Frequent falls 12/29/2023   Feeding difficulties, unspecified 12/20/2023   Shortness of breath 12/20/2023   Abnormal CT of the chest 10/10/2023   Elevated blood sugar 06/30/2023   Carotid stenosis 05/11/2023   Spinal stenosis in cervical region 03/13/2023   Osteoarthritis of spine with radiculopathy, cervical region 07/14/2022   Cervicogenic headache  11/30/2021   Chronic neck pain 11/30/2021   Weight loss, non-intentional    Dysphagia    Polyp of colon    Prostatic hyperplasia 08/25/2021   Chronic insomnia 03/19/2020   Peripheral neuropathy 03/19/2020   Iron deficiency anemia 01/06/2020   Allergic dermatitis 07/08/2019   Osteoarthritis of left knee 02/26/2019   History of colonic polyps    History of duodenal ulcer    CKD (chronic kidney disease), stage IIIa    Bilateral chronic knee pain 11/09/2018   Epistaxis 10/02/2018   Subjective tinnitus, bilateral 10/02/2018   Chronic pain of right thumb 02/22/2018   Acute bilateral thoracic back pain 01/23/2018   Osteoporosis without current pathological fracture 02/10/2017   Hypothyroidism 11/02/2016   Thoracic compression fracture (HCC) 09/06/2016   PVC's (premature ventricular contractions) 07/07/2016   Hypoalbuminemia 06/28/2016   IgA nephropathy 06/07/2016   Acute buttock pain 05/23/2016   MDD (major depressive disorder), recurrent episode, moderate (HCC) 11/20/2015   OSA (obstructive sleep apnea) 02/05/2014   Orthostatic hypotension 02/07/2013   Scotoma involving central area 01/29/2013   Optic neuropathy 01/28/2013   HEMATURIA UNSPECIFIED 01/21/2010   HLD (hyperlipidemia) 07/05/2006  Generalized anxiety disorder 07/05/2006   CAD (coronary artery disease) 07/05/2006   GERD 07/05/2006    PCP: Dr. Greig Ring    REFERRING PROVIDER: Dr. Arley Helling   REFERRING DIAG: M54.6 (ICD-10-CM) - Acute bilateral thoracic back pain  THERAPY DIAG:  Pain in thoracic spine  Cervicalgia  Rationale for Evaluation and Treatment: Rehabilitation  ONSET DATE: May 2025    SUBJECTIVE:                                                                                                                                                                                                         SUBJECTIVE STATEMENT: No pain on arrival, no updates. No fall since last session,    PERTINENT HISTORY:   73yoM referred to physical therapy for ongoing neck pain following cervical fusion in May 2020 and newly developed thoracic pain from T12 compression fracture that occurred sometime in Spring of 2025 (associated radicular pain pattern). Pt has pain localized to upper traps and suboccipitals. Pt attempted DC of prednisone  and gabapentin  two weeks prior to evaluation here, severe decline in function due to pain, barely able to walk for two weeks. He describes feeling increased fatigue and weakness to the point where he struggled to walk. Pt also reported repeated falls in the setting of orthostatic hypotension with him experiencing syncope when he stands up from a seated position. He describes feeling a burning sensation that is also localized to his scapulae and that it occurs when he is doing UE activities like washing his car that is likely the radicular pain from thoracic vertebrae compression fracture. PAIN:  Are you having pain? 0/10 pain at present.   PRECAUTIONS: None Pt has thoracic compression fractures and is s/p cervical spine surgery   WEIGHT BEARING RESTRICTIONS: No  FALLS:  Has patient fallen in last 6 months? Yes. Number of falls 6, pt reports that falls are happening in the context of syncope when standing up from a seated position (Orthostatic Hypotension) , where he blacks out and falls. He is following up with his PCP, Dr. Karlene who is working with him on stabilizing his blood pressure and she has recently taken him off Gabapentin  to see if this helps. Pt reports that he does note an improvement in his symptoms with less light headedness since going off medication.   LIVING ENVIRONMENT: Lives with: lives with their spouse Lives in: House/apartment Stairs: No Has following equipment at home: Single point cane and Environmental Consultant - 4 wheeled  OCCUPATION: Retired     PLOF: Independent  PATIENT GOALS: Relief  of his neck pain while performing upper extremity activities.         OBJECTIVE:  Note: Objective measures were completed at evaluation unless otherwise noted.  Reassessment Measures 02/09/24: -eyes closed balance, normal stance: <5sec with retropulsion each time, ModA falls recovery  -narrow stance eyes open balance: <10sec with LOB, ModA falls recovery  -Long distance AMB assessment c SPC: 128ft with 2 DOE standing breaks ~15 seconds each, 15sec; crossover gait intermittently without LOB; -standing vitals taken at end of walk: stable and comparable to prior seated vitals.  -cervical rotation ROM: 50deg Rt, 53 degree left  -shoulder flexion MMT: 5/5 Rt, 4+/5 left; shoulder ABDCT MMT: 5/5 bilat  -ankle DF assessment: can perform >10x full range while leaning against wall (adequate for gait needs)  -sensory testing about ankles, lower legs: (feel sintact, but pt reports constance N/T below ankle to his toes)  TREATMENT DATE 02/15/24 :  98/33mmHg 87bpm (took midodrine  hours prior)   NUSTEP seat 10, arms arms 12, level 2 At 5 minutes, vitals are assessed: 118/66mmHg 81bpm, 100SpO2 *pt reports low effort for first 5 minutes, no exersion, SOB Performed next 5 minutes at 70spm or higher.  At 10 minutes: 97bpm, 100% SpO2, no SOB, feels exerted, BP: 129/85mmHg   -MigGuard AMB to plinth -STS x8, hands free (cues for upright posture, tips to improve recurrent retropulsion)  -STS x8, hands free -seated blue TB row x15 (scap retraction too!)   -normal stance on airex pad intermittent eyes eyes closedx3 minutes -STS x8, hands free (slow full stance and head neutral each rep)  -seated blue TB row x15 (scap retraction too!)  -normal stance on airex pad: chest press x15 c SPC, then intermittent eyes eyes closed (increased to 5 sec intervals)  x3 minutes -STS x8, hands free (slow full stance and head neutral each rep)  -seated blue TB row x15    PATIENT EDUCATION:  Education details: DC prior HEP; will get updated referral for weakness, imbalance.    Person  educated: Patient Education method: Medical Illustrator Education comprehension: verbalized understanding, returned demonstration, and verbal cues required  HOME EXERCISE PROGRAM:  Need to create new HEP for balance once pt is ready. (Formally DC neck HEP on 02/09/24)   ASSESSMENT:  CLINICAL IMPRESSION: 3 focuses today: sustained cardiorespiratory fitness assessment and treatment, focla proprio balance training, and strength building for postural improvements rleated to spine. Pt nails all of these. Shows improvement with balance within session. Pt has no exacerbation of back pain within session. Patient will benefit from skilled physical therapy intervention to reduce deficits and impairments identified in evaluation, in order to reduce pain, improve quality of life, and maximize activity tolerance for ADL, IADL, and leisure/fitness. Physical therapy will help pt achieve long and short term goals of care.    OBJECTIVE IMPAIRMENTS: decreased balance, decreased ROM, decreased strength, hypomobility, impaired flexibility, impaired UE functional use, and pain.   ACTIVITY LIMITATIONS: carrying, lifting, standing, bathing, dressing, reach over head, and hygiene/grooming PARTICIPATION LIMITATIONS: cleaning, laundry, shopping, community activity, and yard work PERSONAL FACTORS: Age and 3+ comorbidities: CAD, Depression, are also affecting patient's functional outcome.  REHAB POTENTIAL: Good CLINICAL DECISION MAKING: Stable/uncomplicated EVALUATION COMPLEXITY: Low   GOALS: Goals reviewed with patient? No  SHORT TERM GOALS: Target date: 01/03/2024  Patient will demonstrate undestanding of home exercise plan by performing exercises correctly with evidence of good carry over with min to no verbal or tactile cues .   Baseline: NT 12/25/23: Performing exercises independently  Goal status: ACHIEVED   2.  Patient will demonstrate understanding of how to modify exercises to decrease thoracic  and cervical pain exacerbation for improved function and to decrease incidence of pain flare ups.  Baseline: Does not currently have strategy to manage this  Goal status: NOT MET      LONG TERM GOALS: Target date: 03/13/2024  Patient will show a statistically significant improvement in her neck function as evidence by >=7.5 pt decrease in her neck disability index score to better be able to move neck to improve visual field while driving to avoid accidents. Vernel et al, 2009) Baseline: 33/50 (66%); 02/09/24: 66% Goal status: NOT MET     2.  Patient will increase cervical mobility by  >=5 degrees AROM for all planes of motion (flexion, extension, side bending, and rotation) for improved cervical function and evidence of pain reduction. Baseline: Cervical AROM Flex 30, Ext 30, Rot R/L 40/40, SB R/L 35/35; 02/09/24: Rt rotation50 degrees, Left rotation: 53 degress    Goal status: MET    3.  Patient will improve shoulder and periscapular strength by 1/3 grade MMT (4- to 4) for improved cervical stability and UE function to carry out upper extremity activities like washing his car and gardening without being limited by pain and discomfort.  Baseline: Shoulder Flex R/L 4/4, Shoulder Abd R/L 4/4; 10/31: shoulder flexion Rt: 5/5, left 4+/5; shoulder ABDCT 5/5 bilat  Goal status: MET    4.  Patient will reduce neck and thoracic pain to <=4/10 NRPS while performing UE activities like washing his car and doing yard work as evidence of improved cervical and thoracic function.  Baseline: 10/10 cervical paraspinal and rhomboid pain with UE activity; up to a 9/10 intermittently with activity   Goal status: NOT MET    PLAN:  PT FREQUENCY: 1-2x/week PT DURATION: 12 weeks PLANNED INTERVENTIONS: [id number]- PT Re-evaluation, 97750- Physical Performance Testing, 97110-Therapeutic exercises, 97530- Therapeutic activity, [id number]- Neuromuscular re-education, 97535- Self Care, [id number]- Manual therapy, [id number]- Gait training,  [phone]- Canalith repositioning, [id number]- Aquatic Therapy, [id number]- Electrical stimulation (unattended), [id number]- Electrical stimulation (manual), [id number]- Traction (mechanical), 20560 (1-2 muscles), 20561 (3+ muscles)- Dry Needling, Patient/Family education, Balance training, Joint mobilization, Joint manipulation, Spinal manipulation, Spinal mobilization, Vestibular training, DME instructions, Cryotherapy, and Moist heat  PLAN FOR NEXT SESSION:   Once new referral comes in, update goals, reassess for global weakness and imbalance.   9:43 AM, 02/15/24 Peggye JAYSON Linear, PT, DPT Physical Therapist - [facility]  Outpatient Physical Therapy- Main Campus [phone]

## 2024-02-16 ENCOUNTER — Ambulatory Visit: Payer: Self-pay

## 2024-02-16 NOTE — Telephone Encounter (Deleted)
 I have scheduled that appointment. We will see you next Wednesday.  Evalene, CMA

## 2024-02-18 DIAGNOSIS — Z931 Gastrostomy status: Secondary | ICD-10-CM | POA: Insufficient documentation

## 2024-02-18 DIAGNOSIS — Z09 Encounter for follow-up examination after completed treatment for conditions other than malignant neoplasm: Secondary | ICD-10-CM | POA: Insufficient documentation

## 2024-02-18 NOTE — Assessment & Plan Note (Signed)
 Acute, after hospitalization patient now tolerating G-tube feeds well.  Administering medication by grinding through G-tube. He has started to have some weight gain although very minimal. He continues to feel weak and tired. No acute diarrhea this morning likely due to load of medications in G-tube.  Discussed ways we could limit medications.  He will consider.  Return and ER precautions provided.

## 2024-02-20 ENCOUNTER — Telehealth: Payer: Self-pay | Admitting: *Deleted

## 2024-02-20 ENCOUNTER — Ambulatory Visit

## 2024-02-20 ENCOUNTER — Ambulatory Visit: Admitting: Speech Pathology

## 2024-02-20 ENCOUNTER — Encounter: Admitting: Speech Pathology

## 2024-02-20 DIAGNOSIS — R269 Unspecified abnormalities of gait and mobility: Secondary | ICD-10-CM | POA: Diagnosis not present

## 2024-02-20 DIAGNOSIS — E782 Mixed hyperlipidemia: Secondary | ICD-10-CM

## 2024-02-20 DIAGNOSIS — Z09 Encounter for follow-up examination after completed treatment for conditions other than malignant neoplasm: Secondary | ICD-10-CM

## 2024-02-20 DIAGNOSIS — R1312 Dysphagia, oropharyngeal phase: Secondary | ICD-10-CM

## 2024-02-20 DIAGNOSIS — M546 Pain in thoracic spine: Secondary | ICD-10-CM | POA: Diagnosis not present

## 2024-02-20 DIAGNOSIS — R2689 Other abnormalities of gait and mobility: Secondary | ICD-10-CM | POA: Diagnosis not present

## 2024-02-20 DIAGNOSIS — M542 Cervicalgia: Secondary | ICD-10-CM

## 2024-02-20 DIAGNOSIS — M6281 Muscle weakness (generalized): Secondary | ICD-10-CM | POA: Diagnosis not present

## 2024-02-20 DIAGNOSIS — E43 Unspecified severe protein-calorie malnutrition: Secondary | ICD-10-CM

## 2024-02-20 NOTE — Therapy (Signed)
 OUTPATIENT PHYSICAL THERAPY TREATMENT  Patient Name: Shawn Lam MRN: [id number] DOB:Aug 26, 1950, 73 y.o., male Today's Date: 02/20/2024  END OF SESSION:  PT End of Session - 02/20/24 0920     Visit Number 8    Number of Visits 24    Date for Recertification  03/13/24    Authorization Type Medicare 2025    Progress Note Due on Visit 10    PT Start Time 0930    PT Stop Time 1010    PT Time Calculation (min) 40 min    Activity Tolerance Patient tolerated treatment well;No increased pain    Behavior During Therapy [facility] for tasks assessed/performed           Past Medical History:  Diagnosis Date   Actinic keratosis 02/04/2021   right lateral neck   Anemia    Anxiety    Asymptomatic LV dysfunction    50-55% by echo 06/2016   BCC (basal cell carcinoma of skin) 08/05/2020   left neck infra auricular - nodular pattern - treated with [facility] 09/17/2020   Berger's disease    CAD (coronary artery disease) 06/2005   PCI of LAD and RCA   Depression    Dilated aortic root    4cm at sinus of Valsalva   Hematuria    Hyperlipidemia    Hypothyroidism    Insomnia    Left ureteral stone    Orthostatic hypotension    negative tilt table   OSA (obstructive sleep apnea)    moderate with AHI 17/hr, uses CPAP nightly(01/15/19 - has Inspire implant)   PVC's (premature ventricular contractions) 07/07/2016   Renal disorder    PT IS SEEING DR. BETSEY- NEPHROLOGIST   Rosacea    Urticaria    Past Surgical History:  Procedure Laterality Date   ANTERIOR CERVICAL DECOMP/DISCECTOMY FUSION N/A 03/13/2023   Procedure: CERVICAL FOUR-FIVE, CERVICAL FIVE-SIX ANTERIOR CERVICAL DISCECTOMY/DECOMPRESSION FUSION;  Surgeon: Onetha Kuba, MD;  Location: [facility] OR;  Service: Neurosurgery;  Laterality: N/A;  3C   CARDIAC CATHETERIZATION  09/06/2013   [facility]   CARDIAC CATHETERIZATION     COLONOSCOPY WITH PROPOFOL  N/A 01/21/2019   Procedure: COLONOSCOPY WITH PROPOFOL ;  Surgeon: Jinny Carmine, MD;  Location: [facility]  SURGERY CN[facility];  Service: Endoscopy;  Laterality: N/A;  sleep apnea   COLONOSCOPY WITH PROPOFOL  N/A 10/19/2021   Procedure: COLONOSCOPY WITH PROPOFOL ;  Surgeon: Jinny Carmine, MD;  Location: [facility] ENDOSCOPY;  Service: Endoscopy;  Laterality: N/A;   CORONARY ANGIOPLASTY  2004   STENT PLACEMENT   CYSTOSCOPY WITH RETROGRADE PYELOGRAM, URETEROSCOPY AND STENT PLACEMENT Left 03/19/2014   Procedure: CYSTOSCOPY WITH RETROGRADE PYELOGRAM, URETEROSCOPY AND STENT PLACEMENT;  Surgeon: Mickey Ricardo KATHEE Alvaro, MD;  Location: [facility] ORS;  Service: Urology;  Laterality: Left;   CYSTOSCOPY WITH RETROGRADE PYELOGRAM, URETEROSCOPY AND STENT PLACEMENT Bilateral 05/20/2015   Procedure:  CYSTOSCOPY WITH BILATERAL RETROGRADE PYELOGRAM,RIGHT  DIAGNOSTIC URETEROSCOPY ,LEFT URETEROSCOPY WITH HOLMIUM LASER  AND BILATERAL  STENT PLACEMENT ;  Surgeon: Ricardo Alvaro, MD;  Location: [facility] ORS;  Service: Urology;  Laterality: Bilateral;   DRUG INDUCED ENDOSCOPY N/A 10/20/2017   Procedure: DRUG INDUCED SLEEP ENDOSCOPY;  Surgeon: Carlie Clark, MD;  Location: [facility];  Service: ENT;  Laterality: N/A;   EP IMPLANTABLE DEVICE N/A 05/13/2016   Procedure: Loop Recorder Insertion;  Surgeon: Elspeth JAYSON Sage, MD;  Location: [facility] INVASIVE CV LAB;  Service: Cardiovascular;  Laterality: N/A;   ESOPHAGOGASTRODUODENOSCOPY N/A 10/19/2021   Procedure: ESOPHAGOGASTRODUODENOSCOPY (EGD);  Surgeon: Jinny Carmine, MD;  Location: [facility]  ENDOSCOPY;  Service: Endoscopy;  Laterality: N/A;   ESOPHAGOGASTRODUODENOSCOPY (EGD) WITH PROPOFOL  N/A 11/30/2018   Procedure: ESOPHAGOGASTRODUODENOSCOPY (EGD) WITH PROPOFOL ;  Surgeon: Therisa Bi, MD;  Location: [facility] ENDOSCOPY;  Service: Gastroenterology;  Laterality: N/A;   ESOPHAGOGASTRODUODENOSCOPY (EGD) WITH PROPOFOL  N/A 12/01/2018   Procedure: ESOPHAGOGASTRODUODENOSCOPY (EGD) WITH PROPOFOL ;  Surgeon: Jinny Carmine, MD;  Location: [facility] ENDOSCOPY;  Service: Endoscopy;  Laterality: N/A;   HOLMIUM LASER APPLICATION Bilateral  05/20/2015   Procedure: HOLMIUM LASER APPLICATION;  Surgeon: Ricardo Likens, MD;  Location: [facility] ORS;  Service: Urology;  Laterality: Bilateral;   IMPLIMENTATION OF A HYPOGLOSSAL NERVE STIMULATOR  12/08/2017   OSA    IR GASTROSTOMY TUBE MOD SED  01/18/2024   IR RADIOLOGIST EVAL & MGMT  02/13/2024   LEFT HEART CATHETERIZATION WITH CORONARY ANGIOGRAM N/A 09/06/2013   Procedure: LEFT HEART CATHETERIZATION WITH CORONARY ANGIOGRAM;  Surgeon: Victory LELON Claudene DOUGLAS, MD;  Location: [facility] CATH LAB;  Service: Cardiovascular;  Laterality: N/A;   LEFT KNEE CAP  SURGERY ABOUT 40 YRS AGO  ? 1970     STONE EXTRACTION WITH BASKET Left 03/19/2014   Procedure: STONE EXTRACTION WITH BASKET;  Surgeon: Mickey Ricardo KATHEE Likens, MD;  Location: [facility] ORS;  Service: Urology;  Laterality: Left;   TRANSURETHRAL RESECTION OF PROSTATE N/A 08/25/2021   Procedure: TRANSURETHRAL RESECTION OF THE PROSTATE (TURP);  Surgeon: Likens Ricardo, MD;  Location: [facility] ORS;  Service: Urology;  Laterality: N/A;  1 HR   Patient Active Problem List   Diagnosis Date Noted   S/P gastrostomy tube (G tube) placement, follow-up exam 02/18/2024   Skin infection at gastrostomy tube site (HCC) 02/06/2024   Protein-calorie malnutrition, severe 01/23/2024   Multifocal pneumonia 01/22/2024   Fall 01/03/2024   Hematoma of right hip 01/03/2024   Chronic pulmonary aspiration 12/29/2023   Silent aspiration 12/29/2023   Moderate protein-calorie malnutrition (weight for age 20-74% of standard) 12/29/2023   Bilateral leg weakness 12/29/2023   Chronic respiratory failure with hypoxia, on home oxygen  therapy (HCC) 12/29/2023   Chronic constipation 12/29/2023   Somnolence 12/29/2023   Frequent falls 12/29/2023   Feeding difficulties, unspecified 12/20/2023   Shortness of breath 12/20/2023   Abnormal CT of the chest 10/10/2023   Elevated blood sugar 06/30/2023   Carotid stenosis 05/11/2023   Spinal stenosis in cervical region 03/13/2023   Osteoarthritis of spine with  radiculopathy, cervical region 07/14/2022   Cervicogenic headache 11/30/2021   Chronic neck pain 11/30/2021   Weight loss, non-intentional    Dysphagia    Polyp of colon    Prostatic hyperplasia 08/25/2021   Chronic insomnia 03/19/2020   Peripheral neuropathy 03/19/2020   Iron deficiency anemia 01/06/2020   Allergic dermatitis 07/08/2019   Osteoarthritis of left knee 02/26/2019   History of colonic polyps    History of duodenal ulcer    CKD (chronic kidney disease), stage IIIa    Bilateral chronic knee pain 11/09/2018   Epistaxis 10/02/2018   Subjective tinnitus, bilateral 10/02/2018   Chronic pain of right thumb 02/22/2018   Acute bilateral thoracic back pain 01/23/2018   Osteoporosis without current pathological fracture 02/10/2017   Hypothyroidism 11/02/2016   Thoracic compression fracture (HCC) 09/06/2016   PVC's (premature ventricular contractions) 07/07/2016   Hypoalbuminemia 06/28/2016   IgA nephropathy 06/07/2016   Acute buttock pain 05/23/2016   MDD (major depressive disorder), recurrent episode, moderate (HCC) 11/20/2015   OSA (obstructive sleep apnea) 02/05/2014   Orthostatic hypotension 02/07/2013   Scotoma involving central area 01/29/2013   Optic neuropathy 01/28/2013  HEMATURIA UNSPECIFIED 01/21/2010   HLD (hyperlipidemia) 07/05/2006   Generalized anxiety disorder 07/05/2006   CAD (coronary artery disease) 07/05/2006   GERD 07/05/2006    PCP: Dr. Greig Ring    REFERRING PROVIDER: Dr. Arley Helling   REFERRING DIAG: M54.6 (ICD-10-CM) - Acute bilateral thoracic back pain  THERAPY DIAG:  Pain in thoracic spine  Cervicalgia  Rationale for Evaluation and Treatment: Rehabilitation  ONSET DATE: May 2025    SUBJECTIVE:                                                                                                                                                                                                         SUBJECTIVE STATEMENT: Pt reports 1 more  presyncopal episode of falling over the weekend. Pt also reports having burning scapular pain during certain single arm tasks.     PERTINENT HISTORY:  73yoM referred to physical therapy for ongoing neck pain following cervical fusion in May 2020 and newly developed thoracic pain from T12 compression fracture that occurred sometime in Spring of 2025 (associated radicular pain pattern). Pt has pain localized to upper traps and suboccipitals. Pt attempted DC of prednisone  and gabapentin  two weeks prior to evaluation here, severe decline in function due to pain, barely able to walk for two weeks. He describes feeling increased fatigue and weakness to the point where he struggled to walk. Pt also reported repeated falls in the setting of orthostatic hypotension with him experiencing syncope when he stands up from a seated position. He describes feeling a burning sensation that is also localized to his scapulae and that it occurs when he is doing UE activities like washing his car that is likely the radicular pain from thoracic vertebrae compression fracture. PAIN:  Are you having pain? 0/10 pain at present.   PRECAUTIONS: None Pt has thoracic compression fractures and is s/p cervical spine surgery   WEIGHT BEARING RESTRICTIONS: No  FALLS:  Has patient fallen in last 6 months? Yes. Number of falls 6, pt reports that falls are happening in the context of syncope when standing up from a seated position (Orthostatic Hypotension) , where he blacks out and falls. He is following up with his PCP, Dr. Karlene who is working with him on stabilizing his blood pressure and she has recently taken him off Gabapentin  to see if this helps. Pt reports that he does note an improvement in his symptoms with less light headedness since going off medication.   LIVING ENVIRONMENT: Lives with: lives with their spouse Lives in: House/apartment Stairs: No Has following equipment  at home: Single point cane and Walker - 4  wheeled  OCCUPATION: Retired     PLOF: Independent  PATIENT GOALS: Improve balance, decrease falls.       OBJECTIVE:  Note: Objective measures were completed at evaluation unless otherwise noted.  Reassessment Measures 02/09/24: -eyes closed balance, normal stance: <5sec with retropulsion each time, ModA falls recovery  -narrow stance eyes open balance: <10sec with LOB, ModA falls recovery  -Long distance AMB assessment c SPC: 171ft with 2 DOE standing breaks ~15 seconds each, 15sec; crossover gait intermittently without LOB; -standing vitals taken at end of walk: stable and comparable to prior seated vitals.  -cervical rotation ROM: 50deg Rt, 53 degree left  -shoulder flexion MMT: 5/5 Rt, 4+/5 left; shoulder ABDCT MMT: 5/5 bilat  -ankle DF assessment: can perform >10x full range while leaning against wall (adequate for gait needs)  -sensory testing about ankles, lower legs: (feels intact, but pt reports constance N/T below ankle to his toes)  TREATMENT DATE 02/20/24 :  -109/76 76bpm (took midodrine  2 hours prior)   NUSTEP seat 10, arms arms 12, level 3, x5 minutes, goals of >70SPM  At 5 minutes, vitals are assessed: 139/3mmHg 78bpm, 100%SpO2  *No SOB Performed another 5 minutes: seat 10, arms arms 12, level 3, x5 minutes, goals of >70SPM  Post vitals: 140/73mmHg, 82bpm *able to increase Nustep to level 3 today for first time.   -STS from Nustep seat x10 hands free *AMB to chair pad -repeated extension over chair back (arms crossed) x15 (sitting on airex pad) -seated cable row 1x15 @ 17.5lb  -STS from chair + airex, x10, hands free (no LOB)   Able to advance reps today and perform without LOB  -repeated extension over chair back (arms crossed) x15 (sitting on airex pad) -seated cable row 1x15 @ 22.5lb  -STS from chair + airex, x10, hands free (no LOB)  -seated cable row 1x15 @ 22.5lb   -cable resisted walking: backward concentric, forward eccentric 7.5lb to  12.5lb    PATIENT EDUCATION:  Education details: DC prior HEP; will get updated referral for weakness, imbalance.    Person educated: Patient Education method: Medical Illustrator Education comprehension: verbalized understanding, returned demonstration, and verbal cues required  HOME EXERCISE PROGRAM:  Need to create new HEP for balance once pt is ready. (Formally DC neck HEP on 02/09/24)   ASSESSMENT:  CLINICAL IMPRESSION: 3 focuses again today: sustained cardiorespiratory fitness, proprioceptivemotorcontrol balance training, and strength building for postural improvements related to spine. Shows improvement with balance within session. Pt has no exacerbation of back pain within session. Pt able to advance STS today and Rows, also has less LOB in today's session. Patient will benefit from skilled physical therapy intervention to reduce deficits and impairments identified in evaluation, in order to reduce pain, improve quality of life, and maximize activity tolerance for ADL, IADL, and leisure/fitness. Physical therapy will help pt achieve long and short term goals of care.    OBJECTIVE IMPAIRMENTS: decreased balance, decreased ROM, decreased strength, hypomobility, impaired flexibility, impaired UE functional use, and pain.   ACTIVITY LIMITATIONS: carrying, lifting, standing, bathing, dressing, reach over head, and hygiene/grooming PARTICIPATION LIMITATIONS: cleaning, laundry, shopping, community activity, and yard work PERSONAL FACTORS: Age and 3+ comorbidities: CAD, Depression, are also affecting patient's functional outcome.  REHAB POTENTIAL: Good CLINICAL DECISION MAKING: Stable/uncomplicated EVALUATION COMPLEXITY: Low   GOALS: Goals reviewed with patient? No  SHORT TERM GOALS: Target date: 01/03/2024  Patient will demonstrate undestanding of home exercise  plan by performing exercises correctly with evidence of good carry over with min to no verbal or tactile cues .    Baseline: NT 12/25/23: Performing exercises independently  Goal status: ACHIEVED   2.  Patient will demonstrate understanding of how to modify exercises to decrease thoracic and cervical pain exacerbation for improved function and to decrease incidence of pain flare ups.  Baseline: Does not currently have strategy to manage this  Goal status: NOT MET      LONG TERM GOALS: Target date: 03/13/2024  Patient will show a statistically significant improvement in her neck function as evidence by >=7.5 pt decrease in her neck disability index score to better be able to move neck to improve visual field while driving to avoid accidents. Vernel et al, 2009) Baseline: 33/50 (66%); 02/09/24: 66% Goal status: NOT MET     2.  Patient will increase cervical mobility by  >=5 degrees AROM for all planes of motion (flexion, extension, side bending, and rotation) for improved cervical function and evidence of pain reduction. Baseline: Cervical AROM Flex 30, Ext 30, Rot R/L 40/40, SB R/L 35/35; 02/09/24: Rt rotation50 degrees, Left rotation: 53 degress    Goal status: MET    3.  Patient will improve shoulder and periscapular strength by 1/3 grade MMT (4- to 4) for improved cervical stability and UE function to carry out upper extremity activities like washing his car and gardening without being limited by pain and discomfort.  Baseline: Shoulder Flex R/L 4/4, Shoulder Abd R/L 4/4; 10/31: shoulder flexion Rt: 5/5, left 4+/5; shoulder ABDCT 5/5 bilat  Goal status: MET    4.  Patient will reduce neck and thoracic pain to <=4/10 NRPS while performing UE activities like washing his car and doing yard work as evidence of improved cervical and thoracic function.  Baseline: 10/10 cervical paraspinal and rhomboid pain with UE activity; up to a 9/10 intermittently with activity   Goal status: NOT MET    PLAN:  PT FREQUENCY: 1-2x/week PT DURATION: 12 weeks PLANNED INTERVENTIONS: [id number]- PT Re-evaluation, 97750-  Physical Performance Testing, 97110-Therapeutic exercises, 97530- Therapeutic activity, [id number]- Neuromuscular re-education, 97535- Self Care, [id number]- Manual therapy, [id number]- Gait training, [phone]- Canalith repositioning, [id number]- Aquatic Therapy, [id number]- Electrical stimulation (unattended), [id number]- Electrical stimulation (manual), [id number]- Traction (mechanical), 20560 (1-2 muscles), 20561 (3+ muscles)- Dry Needling, Patient/Family education, Balance training, Joint mobilization, Joint manipulation, Spinal manipulation, Spinal mobilization, Vestibular training, DME instructions, Cryotherapy, and Moist heat  PLAN FOR NEXT SESSION:   address global weakness, deconditioning, and imbalance.   9:22 AM, 02/20/24 Peggye JAYSON Linear, PT, DPT Physical Therapist - [facility]  Outpatient Physical Therapy- Main Campus [phone]

## 2024-02-20 NOTE — Therapy (Signed)
 OUTPATIENT SPEECH LANGUAGE PATHOLOGY  SWALLOW TREATMENT    Patient Name: Shawn Lam MRN: [id number] DOB:Jun 14, 1950, 73 y.o., male Today's Date: 02/20/2024  PCP: Greig Ring, MD REFERRING PROVIDER: Greig Ring, MD   End of Session - 02/20/24 0838     Visit Number 8    Number of Visits 25    Date for Recertification  03/25/24    Authorization Type Medicare Part A    Progress Note Due on Visit 10    SLP Start Time 0845    SLP Stop Time  0930    SLP Time Calculation (min) 45 min    Activity Tolerance Patient tolerated treatment well          Past Medical History:  Diagnosis Date   Actinic keratosis 02/04/2021   right lateral neck   Anemia    Anxiety    Asymptomatic LV dysfunction    50-55% by echo 06/2016   BCC (basal cell carcinoma of skin) 08/05/2020   left neck infra auricular - nodular pattern - treated with [facility] 09/17/2020   Berger's disease    CAD (coronary artery disease) 06/2005   PCI of LAD and RCA   Depression    Dilated aortic root    4cm at sinus of Valsalva   Hematuria    Hyperlipidemia    Hypothyroidism    Insomnia    Left ureteral stone    Orthostatic hypotension    negative tilt table   OSA (obstructive sleep apnea)    moderate with AHI 17/hr, uses CPAP nightly(01/15/19 - has Inspire implant)   PVC's (premature ventricular contractions) 07/07/2016   Renal disorder    PT IS SEEING DR. BETSEY- NEPHROLOGIST   Rosacea    Urticaria    Past Surgical History:  Procedure Laterality Date   ANTERIOR CERVICAL DECOMP/DISCECTOMY FUSION N/A 03/13/2023   Procedure: CERVICAL FOUR-FIVE, CERVICAL FIVE-SIX ANTERIOR CERVICAL DISCECTOMY/DECOMPRESSION FUSION;  Surgeon: Onetha Kuba, MD;  Location: [facility] OR;  Service: Neurosurgery;  Laterality: N/A;  3C   CARDIAC CATHETERIZATION  09/06/2013   [facility]   CARDIAC CATHETERIZATION     COLONOSCOPY WITH PROPOFOL  N/A 01/21/2019   Procedure: COLONOSCOPY WITH PROPOFOL ;  Surgeon: Jinny Carmine, MD;  Location: [facility];   Service: Endoscopy;  Laterality: N/A;  sleep apnea   COLONOSCOPY WITH PROPOFOL  N/A 10/19/2021   Procedure: COLONOSCOPY WITH PROPOFOL ;  Surgeon: Jinny Carmine, MD;  Location: [facility] ENDOSCOPY;  Service: Endoscopy;  Laterality: N/A;   CORONARY ANGIOPLASTY  2004   STENT PLACEMENT   CYSTOSCOPY WITH RETROGRADE PYELOGRAM, URETEROSCOPY AND STENT PLACEMENT Left 03/19/2014   Procedure: CYSTOSCOPY WITH RETROGRADE PYELOGRAM, URETEROSCOPY AND STENT PLACEMENT;  Surgeon: Mickey Ricardo KATHEE Alvaro, MD;  Location: [facility] ORS;  Service: Urology;  Laterality: Left;   CYSTOSCOPY WITH RETROGRADE PYELOGRAM, URETEROSCOPY AND STENT PLACEMENT Bilateral 05/20/2015   Procedure:  CYSTOSCOPY WITH BILATERAL RETROGRADE PYELOGRAM,RIGHT  DIAGNOSTIC URETEROSCOPY ,LEFT URETEROSCOPY WITH HOLMIUM LASER  AND BILATERAL  STENT PLACEMENT ;  Surgeon: Ricardo Alvaro, MD;  Location: [facility];  Service: Urology;  Laterality: Bilateral;   DRUG INDUCED ENDOSCOPY N/A 10/20/2017   Procedure: DRUG INDUCED SLEEP ENDOSCOPY;  Surgeon: Carlie Clark, MD;  Location: [facility];  Service: ENT;  Laterality: N/A;   EP IMPLANTABLE DEVICE N/A 05/13/2016   Procedure: Loop Recorder Insertion;  Surgeon: Elspeth JAYSON Sage, MD;  Location: [facility] INVASIVE CV LAB;  Service: Cardiovascular;  Laterality: N/A;   ESOPHAGOGASTRODUODENOSCOPY N/A 10/19/2021   Procedure: ESOPHAGOGASTRODUODENOSCOPY (EGD);  Surgeon: Jinny Carmine, MD;  Location: [facility]  ENDOSCOPY;  Service: Endoscopy;  Laterality: N/A;   ESOPHAGOGASTRODUODENOSCOPY (EGD) WITH PROPOFOL  N/A 11/30/2018   Procedure: ESOPHAGOGASTRODUODENOSCOPY (EGD) WITH PROPOFOL ;  Surgeon: Therisa Bi, MD;  Location: [facility] ENDOSCOPY;  Service: Gastroenterology;  Laterality: N/A;   ESOPHAGOGASTRODUODENOSCOPY (EGD) WITH PROPOFOL  N/A 12/01/2018   Procedure: ESOPHAGOGASTRODUODENOSCOPY (EGD) WITH PROPOFOL ;  Surgeon: Jinny Carmine, MD;  Location: [facility] ENDOSCOPY;  Service: Endoscopy;  Laterality: N/A;   HOLMIUM LASER APPLICATION Bilateral 05/20/2015    Procedure: HOLMIUM LASER APPLICATION;  Surgeon: Ricardo Likens, MD;  Location: [facility];  Service: Urology;  Laterality: Bilateral;   IMPLIMENTATION OF A HYPOGLOSSAL NERVE STIMULATOR  12/08/2017   OSA    IR GASTROSTOMY TUBE MOD SED  01/18/2024   IR RADIOLOGIST EVAL & MGMT  02/13/2024   LEFT HEART CATHETERIZATION WITH CORONARY ANGIOGRAM N/A 09/06/2013   Procedure: LEFT HEART CATHETERIZATION WITH CORONARY ANGIOGRAM;  Surgeon: Victory LELON Claudene DOUGLAS, MD;  Location: [facility] CATH LAB;  Service: Cardiovascular;  Laterality: N/A;   LEFT KNEE CAP  SURGERY ABOUT 40 YRS AGO  ? 1970     STONE EXTRACTION WITH BASKET Left 03/19/2014   Procedure: STONE EXTRACTION WITH BASKET;  Surgeon: Mickey Ricardo KATHEE Likens, MD;  Location: [facility];  Service: Urology;  Laterality: Left;   TRANSURETHRAL RESECTION OF PROSTATE N/A 08/25/2021   Procedure: TRANSURETHRAL RESECTION OF THE PROSTATE (TURP);  Surgeon: Likens Ricardo, MD;  Location: [facility];  Service: Urology;  Laterality: N/A;  1 HR   Patient Active Problem List   Diagnosis Date Noted   S/P gastrostomy tube (G tube) placement, follow-up exam 02/18/2024   Skin infection at gastrostomy tube site (HCC) 02/06/2024   Protein-calorie malnutrition, severe 01/23/2024   Multifocal pneumonia 01/22/2024   Fall 01/03/2024   Hematoma of right hip 01/03/2024   Chronic pulmonary aspiration 12/29/2023   Silent aspiration 12/29/2023   Moderate protein-calorie malnutrition (weight for age 39-74% of standard) 12/29/2023   Bilateral leg weakness 12/29/2023   Chronic respiratory failure with hypoxia, on home oxygen  therapy (HCC) 12/29/2023   Chronic constipation 12/29/2023   Somnolence 12/29/2023   Frequent falls 12/29/2023   Feeding difficulties, unspecified 12/20/2023   Shortness of breath 12/20/2023   Abnormal CT of the chest 10/10/2023   Elevated blood sugar 06/30/2023   Carotid stenosis 05/11/2023   Spinal stenosis in cervical region 03/13/2023   Osteoarthritis of spine with radiculopathy,  cervical region 07/14/2022   Cervicogenic headache 11/30/2021   Chronic neck pain 11/30/2021   Weight loss, non-intentional    Dysphagia    Polyp of colon    Prostatic hyperplasia 08/25/2021   Chronic insomnia 03/19/2020   Peripheral neuropathy 03/19/2020   Iron deficiency anemia 01/06/2020   Allergic dermatitis 07/08/2019   Osteoarthritis of left knee 02/26/2019   History of colonic polyps    History of duodenal ulcer    CKD (chronic kidney disease), stage IIIa    Bilateral chronic knee pain 11/09/2018   Epistaxis 10/02/2018   Subjective tinnitus, bilateral 10/02/2018   Chronic pain of right thumb 02/22/2018   Acute bilateral thoracic back pain 01/23/2018   Osteoporosis without current pathological fracture 02/10/2017   Hypothyroidism 11/02/2016   Thoracic compression fracture (HCC) 09/06/2016   PVC's (premature ventricular contractions) 07/07/2016   Hypoalbuminemia 06/28/2016   IgA nephropathy 06/07/2016   Acute buttock pain 05/23/2016   MDD (major depressive disorder), recurrent episode, moderate (HCC) 11/20/2015   OSA (obstructive sleep apnea) 02/05/2014   Orthostatic hypotension 02/07/2013   Scotoma involving central area 01/29/2013   Optic neuropathy 01/28/2013  HEMATURIA UNSPECIFIED 01/21/2010   HLD (hyperlipidemia) 07/05/2006   Generalized anxiety disorder 07/05/2006   CAD (coronary artery disease) 07/05/2006   GERD 07/05/2006    ONSET DATE: 03/2023 following ACDF; Date of referral 12/28/2023: date of this referral 02/01/2024  REFERRING DIAG:  T17.908D (ICD-10-CM) - Chronic pulmonary aspiration, subsequent encounter  T17.900D (ICD-10-CM) - Silent aspiration, subsequent encounter  E44.0 (ICD-10-CM) - Moderate protein-calorie malnutrition  R13.10 (ICD-10-CM) - Dysphagia, unspecified type    THERAPY DIAG:  Oropharyngeal dysphagia  Rationale for Evaluation and Treatment Rehabilitation  SUBJECTIVE:   PERTINENT HISTORY and DIAGNOSTIC FINDINGS:  Pt is a 73 yo  male w/ ACDF surgery 03/2023.  PMH: GERD(Not on a PPI per chart), Actinic keratosis (02/04/2021), Anemia, Anxiety, Asymptomatic LV dysfunction, BCC (basal cell carcinoma of skin) (08/05/2020), Berger's disease, CAD (coronary artery disease) (06/2005), resting tremor, Depression, Dilated aortic root (HCC), Hematuria, Hyperlipidemia, Hypothyroidism, Insomnia, Left ureteral stone, Orthostatic hypotension, OSA (obstructive sleep apnea), PVC's (premature ventricular contractions) (07/07/2016), Renal disorder, Rosacea, and Urticaria.  Pt is followed by Cardiology for Coronary Artery Stenosis currently; also followed for Migraines.     EGD in 2023 revealed Gastritis.      CT of Chest 10/2023: 1. Interim development of patchy consolidation and ground-glass   disease most evident at the right middle lobe and right lower lobes   with additional areas of mild ground-glass disease and scattered   clustered nodularity, findings are suspicious for multifocal   infection/pneumonia. Imaging follow-up to resolution is recommended.   2. Emphysema.   3. Aortic atherosclerosis.            Dysphagia - reported by pt as Chronic, ever since neck surgery in December 2024. Pt was seen in the emergency room s/p Fall, notes detailing that he slipped on soap in the shower, fell backwards on right side hit head and neck against shower seat, possible brief loss of consciousness, per chart.  ACDF 03/2023 per chart.  Since the neck surgery in 03/2023, he has noted food, liquids and pill getting stuck on the right side of my throat more.  I used to cough a lot more but not as much now.  I cough up what gets stuck..  He has lost significant weight per chart notes due to decreased eating.  Pt endorsed using his home O2 when he feels SOB.   MBSS 12/26/2023 Patient appears to present w/ Chronic, moderate-severe Pharyngeal phase dysphagia per this assessment today. It appeared there was some impact from changes in the Cervical Esophagus  s/p ACDF surgery(03/2023 per chart notes) including structural/tissue changes in/aournd the CP segment and cervical esophagus. Pt with intermittent silent aspiration of thin liquids.   Recommend continue current diet of easy to eat foods at home; thin liquids via cup- at this time. Oral care Before any oral intake. Swallow Strategies Recommended: No straws, moistened foods, SMALL bites/sips, Head Turn RIGHT w/ slight chin tuck, multiple swallows to aid clearing, AND strong Cough/throat clear b/t bites/sips and at end of meal.  D/t the Chronicity of pt's Pharyngeal phase Dysphagia w/ weight loss and pulmonary impact per recent Chest CT, pt and Family may need to discuss alternative means of nutrition/hydration as support w/ his Medical Team/PCP, while seeking swallow therapy.  Received ENT report dated 01/12/2024 Laryngoscopy revealed  Moderate vocal fold bowing bilaterally and moderate glottic gamp with phonation Impression/plan: Aspiration on MBSS and significant weight loss since ACDF last year. TVF shows moderate sized glottic gap on laryngoscoy but mobile vocal folds bilaterally. Pooling  in pyriform on MBSS but no significant pooling of secretions on laryngoscopy. I feel that between speech therpay and possibly bilateral injection laryngoplasty, hopefully his swallowing will significantly improve. Will make referral to Dr Brien at [facility].   Pt and his wife report appt with Dr Brien in January 2026.  10/10/205 PEG placed  01/22/2024 thru 01/24/2024 Hospitalized d/t fall and confusion CT of the chest on arrival showed multifocal pneumonia and possible free air. When reviewed further with interventional radiology team it was determined this was an appropriate amount of free air secondary to recent PEG tube placement. Patient was initiated on antibiotic therapy for pneumonia and improved significantly.   02/07/2024 CT Chest 1. Evolving multifocal inflammatory infiltrates, progressive  within the right upper lobe and improved within the lung bases bilaterally. Recommend appropriate clinical management and imaging follow-up per clinical course. 2. Mild emphysema. Given emphysema as an independent lung cancer risk factor, consider evaluation for a low-dose CT lung cancer screening program if the patient is 73 years old and otherwise eligible.   PAIN:  Are you having pain? No  FALLS: Has patient fallen in last 6 months?  See PT evaluation for details  LIVING ENVIRONMENT: Lives with: lives with their spouse Lives in: House/apartment  PLOF:  Level of assistance: Independent with ADLs, Independent with IADLs Employment: Retired   PATIENT GOALS  to improve swallow function, nutrition and hydration, decrease aspiration risks  SUBJECTIVE STATEMENT: Pt eager, pleasant Pt accompanied by: self   OBJECTIVE:   TODAY'S TREATMENT:  Skilled treatment session focused on pt's dysphagia goals. SLP facilitated the session by providing the following interventions:   Respiratory Muscle Strengthening: Introduced PUBLIC RELATIONS ACCOUNT EXECUTIVE and IMST with skilled education and instruction provided on both exercises.  EMST 75 set to 15 cm H2O: Pt able to complete 3 sets of 6 repetitions with moderate faded to supervision verbal and visual cues to control rate and breath support.  IMST set to 15 cm H2O: Pt able to complete 2 sets of 6 repetitions with moderate faded to supervision verbal and visual cues to control rate and breath support.  Written instructions provided for pt to add EMST to HEP - pt voiced understanding.   Skilled observation of pharyngeal strengthening exercises (as below).  Trial set 1: Pt consumed 10 spoonfuls of thin water  with verbal reminder needed to utilize head turn to right with chin tuck I forget that pt observed with intermittent throat clear  [place]: Pt able to complete 1 set of 7 repetitions with supervision cues  Trial set 2: Pt consumed about 10 spoonfuls of thin water   with continued need for initial verbal reminder to utilize head turn to right with chin tuck -  pt with throat clear after each swallow observed Chin tuck against resistance (CTAR): pt able to complete 2 sets of 5 repetitions independently Pt independently consumed 10 spoonfuls of applesauce with head turn, chin tuck, and double swallow. Pt with no overt s/s of aspiration.      Pt advised to continue on current diet - will plan to schedule another MBSS in the coming weeks.  PATIENT EDUCATION: Education details: ST POC, repeat MBSS in 8 to 12 weeks Person educated: Patient and Spouse Education method: Chief Technology Officer Education comprehension: needs further education  HOME EXERCISE PROGRAM:     See above   GOALS: Goals reviewed with patient? Yes  SHORT TERM GOALS: Target date: 10 sessions  Updated 02/09/2024 With Mod I, pt will perform compensatory swallow strategies (chin tuck,  multiple swallows, head turn) to eliminate s/s of aspiration when consuming least restrictive diet. Baseline: Goal status: INITIAL: moderate A   2.  With Min A, pt will complete (supraglottic, swallow, Mednelson maneuver, effortful swallow) to improve oral motor weakness, tongue base retraction, hyolarnygeal excursion, airway protection and/or clearance of the bolus through the pharynx. Baseline:  Goal status: INITIAL: progress made; moderate A  3. Pt will demonstrate overt s/s of aspiration when consuming ice chips.   Baseline: Goal status: INITIAL: no overt s/s of aspiration observed during today's session   LONG TERM GOALS: Target date: 03/25/2024  Updated: 02/09/2024 With Mod I, pt will demonstrate the ability to adequately self-monitor swallowing skills and perform appropriate compensatory techniques to reduce s/s of aspiration and to safely consume least restrictive diet.     Baseline:  Goal status: INITIAL: continues to be appropriate goal  2.  Pt will participate in repeat instrumental  swallow evaluation to assess least restrictive diet.  Baseline:  Goal status: INITIAL: continues to be appropriate goal   ASSESSMENT:  CLINICAL IMPRESSION: Patient is a 73 y.o. male who was seen today for a clinical swallow re-evaluation d/t severe pharyngeal phase dysphagia following ACDF (03/2023).    See the above treatment note for details.   OBJECTIVE IMPAIRMENTS include dysphagia. These impairments are limiting patient from safety when swallowing. Factors affecting potential to achieve goals and functional outcome are severity of impairments and malnutrition. Patient will benefit from skilled SLP services to address above impairments and improve overall function.  REHAB POTENTIAL: Good  PLAN: SLP FREQUENCY: 1-2x/week  SLP DURATION: 12 weeks  PLANNED INTERVENTIONS: Aspiration precaution training, Pharyngeal strengthening exercises, Diet toleration management , Oral motor exercises, SLP instruction and feedback, Compensatory strategies, and Patient/family education  Hassell Roys, SLP student clinician  Happi B. Rubbie, M.S., CCC-SLP, Tree Surgeon Certified Brain Injury Specialist [facility]  [facility] Office [phone] Ascom [phone] Fax [phone]

## 2024-02-20 NOTE — Telephone Encounter (Signed)
 Please advise what 6 month labs needs to be drawn.

## 2024-02-20 NOTE — Telephone Encounter (Signed)
-----   Message from Veva JINNY Ferrari sent at 02/14/2024  2:30 PM EST ----- Regarding: Lab orders for Mon, 11.17.25 Lab orders for a 6 month follow up appt

## 2024-02-21 ENCOUNTER — Encounter: Payer: Self-pay | Admitting: Sleep Medicine

## 2024-02-21 ENCOUNTER — Other Ambulatory Visit

## 2024-02-21 ENCOUNTER — Ambulatory Visit (INDEPENDENT_AMBULATORY_CARE_PROVIDER_SITE_OTHER): Admitting: Sleep Medicine

## 2024-02-21 VITALS — BP 100/60 | HR 78 | Ht 72.0 in | Wt 118.0 lb

## 2024-02-21 DIAGNOSIS — G4733 Obstructive sleep apnea (adult) (pediatric): Secondary | ICD-10-CM

## 2024-02-21 DIAGNOSIS — I10 Essential (primary) hypertension: Secondary | ICD-10-CM

## 2024-02-21 DIAGNOSIS — Z9682 Presence of neurostimulator: Secondary | ICD-10-CM

## 2024-02-21 NOTE — Progress Notes (Signed)
 Name:Shawn Lam MRN: [id number] DOB: 02-03-1951   CHIEF COMPLAINT:  INSPIRE F/U   HISTORY OF PRESENT ILLNESS: Mr. Peak is a 74 y.o. w/ a h/o OSA, HTN, hypothyroidism, anxiety, depression, CAD and COPD who presents for Inspire F/U visit. Reports using Inspire therapy every night, which is confirmed by compliance data. Patient is averaging 7 hours and 23 mins of sleep per night. He is currently set to 1.4 V. Reports loud snoring and feeling unrefreshed upon awakening.    EPWORTH SLEEP SCORE 8    01/05/2024   10:40 AM  Results of the Epworth flowsheet  Sitting and reading 3  Watching TV 2  Sitting, inactive in a public place (e.g. a theatre or a meeting) 0  As a passenger in a car for an hour without a break 0  Lying down to rest in the afternoon when circumstances permit 3  Sitting and talking to someone 0  Sitting quietly after a lunch without alcohol 0  In a car, while stopped for a few minutes in traffic 0  Total score 8    PAST MEDICAL HISTORY :   has a past medical history of Actinic keratosis (02/04/2021), Anemia, Anxiety, Asymptomatic LV dysfunction, BCC (basal cell carcinoma of skin) (08/05/2020), Berger's disease, CAD (coronary artery disease) (06/2005), Depression, Dilated aortic root, Hematuria, Hyperlipidemia, Hypothyroidism, Insomnia, Left ureteral stone, Orthostatic hypotension, OSA (obstructive sleep apnea), PVC's (premature ventricular contractions) (07/07/2016), Renal disorder, Rosacea, and Urticaria.  has a past surgical history that includes LEFT KNEE CAP  SURGERY ABOUT 40 YRS AGO  ? 1970; Cardiac catheterization (09/06/2013); Cardiac catheterization; Coronary angioplasty (2004); left heart catheterization with coronary angiogram (N/A, 09/06/2013); Cystoscopy with retrograde pyelogram, ureteroscopy and stent placement (Left, 03/19/2014); Stone extraction with basket (Left, 03/19/2014); Cystoscopy with retrograde pyelogram, ureteroscopy and stent placement  (Bilateral, 05/20/2015); Holmium laser application (Bilateral, 05/20/2015); Cardiac catheterization (N/A, 05/13/2016); Drug induced endoscopy (N/A, 10/20/2017); IMPLIMENTATION OF A HYPOGLOSSAL NERVE STIMULATOR (12/08/2017); Esophagogastroduodenoscopy (egd) with propofol  (N/A, 11/30/2018); Esophagogastroduodenoscopy (egd) with propofol  (N/A, 12/01/2018); Colonoscopy with propofol  (N/A, 01/21/2019); Transurethral resection of prostate (N/A, 08/25/2021); Colonoscopy with propofol  (N/A, 10/19/2021); Esophagogastroduodenoscopy (N/A, 10/19/2021); Anterior cervical decomp/discectomy fusion (N/A, 03/13/2023); IR GASTROSTOMY TUBE MOD SED (01/18/2024); and IR Radiologist Eval & Mgmt (02/13/2024). Prior to Admission medications   Medication Sig Start Date End Date Taking? Authorizing Provider  aspirin  EC 81 MG tablet Take 81 mg by mouth daily. 10/02/18   [provider]  atorvastatin  (LIPITOR ) 80 MG tablet TAKE 1 TABLET BY MOUTH DAILY 08/21/23   Bedsole, Amy E, MD  baclofen (LIORESAL) 10 MG tablet Take 10 mg by mouth 2 (two) times daily. 09/19/23   [provider]  buPROPion  (WELLBUTRIN  XL) 150 MG 24 hr tablet TAKE 3 TABLETS BY MOUTH DAILY 11/13/23   Bedsole, Amy E, MD  busPIRone  (BUSPAR ) 10 MG tablet TAKE 2 TABLETS BY MOUTH 3 TIMES A DAY 11/06/23   Bedsole, Amy E, MD  Cholecalciferol  (VITAMIN D3) 50 MCG (2000 UT) TABS Take 2,000 Units by mouth daily.    [provider]  clopidogrel  (PLAVIX ) 75 MG tablet TAKE 1 TABLET BY MOUTH DAILY 06/05/23   Gerard Frederick, NP  cyanocobalamin  (VITAMIN B12) 1000 MCG tablet Take 1,000 mcg by mouth daily. 10/02/18   [provider]  denosumab  (PROLIA ) 60 MG/ML SOSY injection Inject 60 mg into the skin every 6 (six) months. Do not send RX, order this from the office    [provider]  DULoxetine  (CYMBALTA ) 30 MG  capsule TAKE THREE CAPSULES BY MOUTH DAILY 10/11/23   Bedsole, Amy E, MD  Erenumab-aooe (AIMOVIG) 140 MG/ML SOAJ 140 mg every 28 (twenty-eight) days.     [provider]  finasteride  (PROSCAR ) 5 MG tablet Take 5 mg by mouth daily. 01/22/21   [provider]  levothyroxine  (SYNTHROID ) 88 MCG tablet TAKE 1 TABLET BY MOUTH DAILY BEFORE BREAKFAST 08/28/23   Bedsole, Amy E, MD  losartan  (COZAAR ) 50 MG tablet Take 1 tablet (50 mg total) by mouth daily. 12/25/23   Gollan, Timothy J, MD  Magnesium  250 MG CAPS Take 250 mg by mouth daily.    [provider]  metoCLOPramide (REGLAN) 10 MG tablet Take 10 mg by mouth every 8 (eight) hours as needed. 12/13/23   [provider]  metoprolol  succinate (TOPROL  XL) 25 MG 24 hr tablet Take 1 tablet (25 mg total) by mouth daily. 08/09/23   Gerard Frederick, NP  nitroGLYCERIN  (NITROSTAT ) 0.4 MG SL tablet Place 1 tablet (0.4 mg total) under the tongue every 5 (five) minutes as needed for chest pain. 07/04/23   Bedsole, Amy E, MD  triamcinolone  cream (KENALOG ) 0.1 % APPLY 2 TOPICALLY DAILY , DO NOT APPLY ON DACE , GROIN AND UNDERARMS. 12/29/23   Bedsole, Amy E, MD  zolpidem  (AMBIEN ) 5 MG tablet Take 1 tablet (5 mg total) by mouth at bedtime as needed. 12/18/23   Bedsole, Amy E, MD  zonisamide  (ZONEGRAN ) 25 MG capsule Take 1 capsule (25 mg total) by mouth daily. 12/15/23   Avelina Greig BRAVO, MD   Allergies  Allergen Reactions   Codeine Nausea Only and Nausea And Vomiting   Tape Other (See Comments)    Use only paper tape. Severe blistering and bruising with other tapes. No cloth tape.    FAMILY HISTORY:  family history includes Cancer in his father; Coronary artery disease in his mother; Diabetes in his brother; Heart attack in his mother; Heart disease in his mother; Hypertension in his mother; Lung cancer in his father. SOCIAL HISTORY:  reports that he has never smoked. He has never used smokeless tobacco. He reports that he does not currently use alcohol after a past usage of about 1.0 standard drink of alcohol per week. He reports that he does not use drugs.   Review of Systems:  Gen:   Denies  fever, sweats, chills weight loss  HEENT: Denies blurred vision, double vision, ear pain, eye pain, hearing loss, nose bleeds, sore throat Cardiac:  No dizziness, chest pain or heaviness, chest tightness,edema, No JVD Resp:   No cough, -sputum production, -shortness of breath,-wheezing, -hemoptysis,  Gi: Denies swallowing difficulty, stomach pain, nausea or vomiting, diarrhea, constipation, bowel incontinence Gu:  Denies bladder incontinence, burning urine Ext:   Denies Joint pain, stiffness or swelling Skin: Denies  skin rash, easy bruising or bleeding or hives Endoc:  Denies polyuria, polydipsia , polyphagia or weight change Psych:   Denies depression, insomnia or hallucinations  Other:  All other systems negative  VITAL SIGNS: BP 100/60   Pulse 78   Ht 6' (1.829 m)   Wt 118 lb (53.5 kg)   SpO2 99%   BMI 16.00 kg/m     Physical Examination:   General Appearance: No distress  EYES PERRLA, EOM intact.   NECK Supple, No JVD Pulmonary: normal breath sounds, No wheezing.  CardiovascularNormal S1,S2.  No m/r/g.   Abdomen: Benign, Soft, non-tender. Skin:   warm, no rashes, no ecchymosis  Extremities: normal, no cyanosis, clubbing. Neuro:without focal  findings,  speech normal  PSYCHIATRIC: Mood, affect within normal limits.   ASSESSMENT AND PLAN  OSA Inspire device interrogated in office. Amplitude increased to 1.5 V.  Will complete a home sleep study in 6 weeks with Inspire therapy. Discussed the consequences of untreated sleep apnea. Advised not to drive drowsy for safety of patient and others. Will complete further evaluation with a home sleep study and follow up to review results.    HTN Stable, on current management. Following with PCP.    MEDICATION ADJUSTMENTS/LABS AND TESTS ORDERED: Recommend Sleep Study   Patient  satisfied with Plan of action and management. All questions answered  Follow up to review HST results and treatment plan.   I spent a total  of 33 minutes reviewing chart data, face-to-face evaluation with the patient, counseling and coordination of care as detailed above.    Malacki Mcphearson, M.D.  Sleep Medicine [facility]

## 2024-02-22 ENCOUNTER — Telehealth: Payer: Self-pay | Admitting: Sleep Medicine

## 2024-02-22 ENCOUNTER — Ambulatory Visit: Admitting: Pulmonary Disease

## 2024-02-22 ENCOUNTER — Ambulatory Visit (INDEPENDENT_AMBULATORY_CARE_PROVIDER_SITE_OTHER): Admitting: Pulmonary Disease

## 2024-02-22 ENCOUNTER — Ambulatory Visit: Admitting: Speech Pathology

## 2024-02-22 ENCOUNTER — Encounter: Payer: Self-pay | Admitting: Pulmonary Disease

## 2024-02-22 VITALS — BP 104/64 | HR 74 | Temp 97.7°F | Ht 72.0 in | Wt 123.0 lb

## 2024-02-22 DIAGNOSIS — R1312 Dysphagia, oropharyngeal phase: Secondary | ICD-10-CM | POA: Diagnosis not present

## 2024-02-22 DIAGNOSIS — Z931 Gastrostomy status: Secondary | ICD-10-CM

## 2024-02-22 DIAGNOSIS — T17908D Unspecified foreign body in respiratory tract, part unspecified causing other injury, subsequent encounter: Secondary | ICD-10-CM

## 2024-02-22 DIAGNOSIS — M546 Pain in thoracic spine: Secondary | ICD-10-CM | POA: Diagnosis not present

## 2024-02-22 DIAGNOSIS — R1314 Dysphagia, pharyngoesophageal phase: Secondary | ICD-10-CM | POA: Diagnosis not present

## 2024-02-22 DIAGNOSIS — R0689 Other abnormalities of breathing: Secondary | ICD-10-CM

## 2024-02-22 DIAGNOSIS — R2689 Other abnormalities of gait and mobility: Secondary | ICD-10-CM | POA: Diagnosis not present

## 2024-02-22 DIAGNOSIS — J690 Pneumonitis due to inhalation of food and vomit: Secondary | ICD-10-CM

## 2024-02-22 DIAGNOSIS — R9389 Abnormal findings on diagnostic imaging of other specified body structures: Secondary | ICD-10-CM

## 2024-02-22 DIAGNOSIS — M6281 Muscle weakness (generalized): Secondary | ICD-10-CM | POA: Diagnosis not present

## 2024-02-22 DIAGNOSIS — Z8701 Personal history of pneumonia (recurrent): Secondary | ICD-10-CM

## 2024-02-22 DIAGNOSIS — R269 Unspecified abnormalities of gait and mobility: Secondary | ICD-10-CM | POA: Diagnosis not present

## 2024-02-22 DIAGNOSIS — M542 Cervicalgia: Secondary | ICD-10-CM | POA: Diagnosis not present

## 2024-02-22 DIAGNOSIS — R0609 Other forms of dyspnea: Secondary | ICD-10-CM

## 2024-02-22 NOTE — Telephone Encounter (Signed)
 Copied from CRM #[id number]. Topic: Appointments - Appointment Scheduling >> Feb 22, 2024 11:27 AM Devaughn RAMAN wrote: Patient is calling to schedule an appointment. Pt has an Implanted Inspire device, advised to send CRM to admin pool for further f/u. Please f/u with pt. Pt states the adjustment made yesterday has created an issue with his device. He states it isn't staying on all night. Also states needs to come in to office.

## 2024-02-22 NOTE — Patient Instructions (Signed)
 VISIT SUMMARY:  During your visit, we discussed the results of your recent CT scan and addressed your ongoing symptoms, including shortness of breath and difficulties with swallowing. We also reviewed your current nutritional regimen and the adjustments made to your Hudson device. Plans for further diagnostic tests were outlined to better understand and manage your conditions.  YOUR PLAN:  -CHRONIC OROPHARYNGEAL DYSPHAGIA WITH RECURRENT ASPIRATION AND ASPIRATION PNEUMONITIS: This condition involves difficulty swallowing, which leads to food or liquid entering the lungs, causing inflammation and infection. We will wait for the results of your swallow test to assess your swallowing function and determine if you can stop using the feeding tube. Until then, continue with your current nutritional support via the tube.  -PLATYPNEA (POSITIONAL SHORTNESS OF BREATH): This condition causes shortness of breath when you stand up, which goes away when you lie down. We suspect it might be due to a heart issue, so we have ordered an echocardiogram to check for any heart defects. If a defect is found, we may consider a catheter-based procedure to fix it.  -HISTORY OF RECURRENT PNEUMONIA: You have had multiple episodes of pneumonia, often related to your swallowing difficulties. Your recent CT scan shows that the latest episode has resolved. We will continue to monitor you for any signs of pneumonia returning.  INSTRUCTIONS:  Please continue with your current nutritional support via the feeding tube until we get the results of your swallow test. Attend your scheduled echocardiogram to evaluate for any potential heart issues. Follow up with us  after these tests so we can discuss the results and next steps.

## 2024-02-22 NOTE — Progress Notes (Unsigned)
 Subjective:    Patient ID: Shawn Lam, male    DOB: November 06, 1950, 73 y.o.   MRN: [id number]  Patient Care Team: Avelina Greig BRAVO, MD as PCP - General Perla Evalene PARAS, MD as PCP - Cardiology (Cardiology) Fate Morna SAILOR, [facility] (Inactive) as Pharmacist (Pharmacist) Laurice Francis NOVAK, [place] (Optometry) Onetha Kuba, MD as Consulting Physician (Neurosurgery)  Chief Complaint  Patient presents with   Medical Management of Chronic Issues    Shortness of breath on exertion. Occasional cough.     BACKGROUND/INTERVAL:Patient is a 73 year old lifelong never smoker who presents for full follow-up of an abnormal CT scan of the chest obtained on 08 November 2023.   HPI Discussed the use of AI scribe software for clinical note transcription with the patient, who gave verbal consent to proceed.  History of Present Illness   [name] is a 73 year old male who presents for follow-up on an abnormal CT scan of the chest.  Recent imaging from October 29th was reviewed, showing changes consistent with aspiration with evanescent infiltrates.  He experiences significant shortness of breath, particularly when transitioning from sitting or lying down to standing, describing it as severe and noting that it does not occur when lying down. This symptom is particularly concerning when getting out of the car and starting to walk, as it causes him to feel like he cannot breathe.  He is currently receiving nutrition through a feeding tube, consuming six boxes of nutritional supplements daily, with water  before and after each feeding. He finds it challenging to maintain this regimen. A swallow test is scheduled to assess his ability to eat orally.  He recently had a sleep study and adjustments were made to his Inspire device, but he reports that it is not working effectively, necessitating further follow-up.  He is being followed by our sleep medicine team in this regard.  An echocardiogram is also planned  to investigate potential cardiac causes for his symptoms (echo with bubble study).      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  Tiny right renal stone.  Aortic atherosclerosis. 11/08/2023 CT chest without contrast: Patchy consolidation and ground glass disease most evident at the right middle lobe and right lower lobes with additional areas of mild ground glass disease and scattered clustered nodularity.  Findings are suspicious for multifocal infection/pneumonia, imaging follow-up to resolution is recommended.  On independent review this is consistent with chronic aspiration.  Emphysema. 01/10/2024 PFTs: FEV1 3.62 L or 106% predicted, FVC 3.95 L or 84% predicted, FEV1/FVC 92%, lung volumes normal.  No bronchodilator response.  Diffusion capacity minimally reduced corrects to normal by alveolar volume.  Overall normal study.  Does not explain patient's dyspnea. 02/07/2024 CT chest without contrast: Evolving multifocal inflammatory infiltrates progressive within the right upper lobe and improved within the lung bases bilaterally recommend appropriate clinical management and imaging follow-up per clinical course.  Mild emphysema.  Review of Systems A 10 point review of systems was performed and it is as noted above otherwise negative.   Patient Active Problem List   Diagnosis Date Noted   S/P gastrostomy tube (G tube) placement, follow-up exam 02/18/2024   Skin infection at gastrostomy tube site [facility]) 02/06/2024   Protein-calorie malnutrition, severe 01/23/2024   Multifocal pneumonia 01/22/2024   Fall 01/03/2024   Hematoma of right hip 01/03/2024   Chronic pulmonary aspiration 12/29/2023  Silent aspiration 12/29/2023   Moderate protein-calorie malnutrition (weight for age 47-74% of standard) 12/29/2023   Bilateral leg weakness  12/29/2023   Chronic respiratory failure with hypoxia, on home oxygen  therapy (HCC) 12/29/2023   Chronic constipation 12/29/2023   Somnolence 12/29/2023   Frequent falls 12/29/2023   Feeding difficulties, unspecified 12/20/2023   Shortness of breath 12/20/2023   Abnormal CT of the chest 10/10/2023   Elevated blood sugar 06/30/2023   Carotid stenosis 05/11/2023   Spinal stenosis in cervical region 03/13/2023   Osteoarthritis of spine with radiculopathy, cervical region 07/14/2022   Cervicogenic headache 11/30/2021   Chronic neck pain 11/30/2021   Weight loss, non-intentional    Dysphagia    Polyp of colon    Prostatic hyperplasia 08/25/2021   Chronic insomnia 03/19/2020   Peripheral neuropathy 03/19/2020   Iron deficiency anemia 01/06/2020   Allergic dermatitis 07/08/2019   Osteoarthritis of left knee 02/26/2019   History of colonic polyps    History of duodenal ulcer    CKD (chronic kidney disease), stage IIIa    Bilateral chronic knee pain 11/09/2018   Epistaxis 10/02/2018   Subjective tinnitus, bilateral 10/02/2018   Chronic pain of right thumb 02/22/2018   Acute bilateral thoracic back pain 01/23/2018   Osteoporosis without current pathological fracture 02/10/2017   Hypothyroidism 11/02/2016   Thoracic compression fracture (HCC) 09/06/2016   PVC's (premature ventricular contractions) 07/07/2016   Hypoalbuminemia 06/28/2016   IgA nephropathy 06/07/2016   Acute buttock pain 05/23/2016   MDD (major depressive disorder), recurrent episode, moderate (HCC) 11/20/2015   OSA (obstructive sleep apnea) 02/05/2014   Orthostatic hypotension 02/07/2013   Scotoma involving central area 01/29/2013   Optic neuropathy 01/28/2013   HEMATURIA UNSPECIFIED 01/21/2010   HLD (hyperlipidemia) 07/05/2006   Generalized anxiety disorder 07/05/2006   CAD (coronary artery disease) 07/05/2006   GERD 07/05/2006    Social History   Tobacco Use   Smoking status: Never   Smokeless tobacco:  Never  Substance Use Topics   Alcohol use: Not Currently    Alcohol/week: 1.0 standard drink of alcohol    Types: 1 Cans of beer per week    Allergies  Allergen Reactions   Codeine Nausea Only and Nausea And Vomiting   Tape Other (See Comments)    Use only paper tape. Severe blistering and bruising with other tapes. No cloth tape.    Current Meds  Medication Sig   aspirin  81 MG chewable tablet Place 1 tablet (81 mg total) into feeding tube daily.   atorvastatin  (LIPITOR ) 80 MG tablet Place 1 tablet (80 mg total) into feeding tube daily.   baclofen (LIORESAL) 10 MG tablet Place 1 tablet (10 mg total) into feeding tube 2 (two) times daily.   buPROPion  (WELLBUTRIN  XL) 150 MG 24 hr tablet Take 2 tablets (300 mg total) by mouth daily.   busPIRone  (BUSPAR ) 10 MG tablet Place 2 tablets (20 mg total) into feeding tube 3 (three) times daily.   cephALEXin (KEFLEX) 500 MG capsule Place 1 capsule (500 mg total) into feeding tube 3 (three) times daily.   Cholecalciferol  (VITAMIN D3) 50 MCG (2000 UT) TABS Place 1 tablet (2,000 Units total) into feeding tube daily.   clopidogrel  (PLAVIX ) 75 MG tablet Place 1 tablet (75 mg total) into feeding tube daily.   cyanocobalamin  1000 MCG tablet Place 1 tablet (1,000 mcg total) into feeding tube daily.   denosumab  (PROLIA ) 60 MG/ML SOSY injection Inject 60 mg into the skin every 6 (six) months. Do not send  RX, order this from the office   DULoxetine  (CYMBALTA ) 30 MG capsule TAKE THREE CAPSULES BY MOUTH DAILY   Erenumab-aooe (AIMOVIG) 140 MG/ML SOAJ 140 mg every 28 (twenty-eight) days.   finasteride  (PROSCAR ) 5 MG tablet Take 5 mg by mouth daily.   levothyroxine  (SYNTHROID ) 88 MCG tablet Place 1 tablet (88 mcg total) into feeding tube daily before breakfast.   magnesium  oxide (MAG-OX) 400 (240 Mg) MG tablet Place 1 tablet (400 mg total) into feeding tube daily.   metoprolol  tartrate (LOPRESSOR ) 25 mg/10 mL SUSP Place 4 mLs (10 mg total) into feeding tube 2  (two) times daily.   midodrine  (PROAMATINE ) 5 MG tablet Take 1 tablet (5 mg total) by mouth 3 (three) times daily as needed. For Systolic Blood Pressure (Top number) of less than 115   nitroGLYCERIN  (NITROSTAT ) 0.4 MG SL tablet Place 1 tablet (0.4 mg total) under the tongue every 5 (five) minutes as needed for chest pain.   Nutritional Supplements (FEEDING SUPPLEMENT, OSMOLITE 1.5 CAL,) LIQD Place 120 mLs into feeding tube 6 (six) times daily.   triamcinolone  cream (KENALOG ) 0.1 % APPLY 2 TOPICALLY DAILY , DO NOT APPLY ON DACE , GROIN AND UNDERARMS.   Water  For Irrigation, Sterile (FREE WATER ) SOLN Place 100 mLs into feeding tube 6 (six) times daily.   zolpidem  (AMBIEN ) 10 MG tablet TAKE 1 TABLET BY MOUTH EVERY NIGHT AT BEDTIME AS NEEDED FOR SLEEP   zonisamide  (ZONEGRAN ) 25 MG capsule Take 1 capsule (25 mg total) by mouth daily.    Immunization History  Administered Date(s) Administered   Fluad Quad(high Dose 65+) 12/04/2018, 01/01/2020   INFLUENZA, HIGH DOSE SEASONAL PF 01/04/2021   Influenza Split 01/04/2011, 01/27/2012   Influenza Whole 01/09/2008, 03/04/2009, 12/27/2009   Influenza,inj,Quad PF,6+ Mos 01/31/2013, 01/31/2014, 02/03/2015, 02/09/2016, 01/20/2017, 01/23/2018   Influenza-Unspecified 12/08/2021, 12/14/2022   PFIZER Comirnaty(Gray Top)Covid-19 Tri-Sucrose Vaccine 05/04/2020   PFIZER(Purple Top)SARS-COV-2 Vaccination 06/01/2019, 06/25/2019   Pneumococcal Conjugate-13 02/09/2016   Pneumococcal Polysaccharide-23 02/23/2017   Td 06/05/2007   Tdap 03/13/2019   Zoster Recombinant(Shingrix) 10/13/2021, 12/16/2021   Zoster, Live 01/25/2011        Objective:     BP 104/64   Pulse 74   Temp 97.7 F (36.5 C) (Temporal)   Ht 6' (1.829 m)   Wt 123 lb (55.8 kg)   SpO2 97%   BMI 16.68 kg/m   SpO2: 97 %  GENERAL: Cachectic gentleman, no acute distress, wet sounding voice with almost constant throat clearing. HEAD: Normocephalic, atraumatic.  EYES: Pupils equal, round,  reactive to light.  No scleral icterus.  MOUTH: Oral mucosa moist. NECK: Limited range of motion. No thyromegaly. Trachea midline. No JVD.  No adenopathy. PULMONARY: Good air entry bilaterally.  No adventitious sounds. CARDIOVASCULAR: S1 and S2. Regular rate and rhythm.  No rubs, murmurs or gallops heard. ABDOMEN: Scaphoid, otherwise benign. MUSCULOSKELETAL: No joint deformity, no clubbing, no edema.  Significant muscle wasting noted. NEUROLOGIC: Swallowing appears impaired.  No overt gait disturbance. SKIN: Intact,warm,dry. PSYCH: Mood and behavior normal.   Assessment & Plan:     ICD-10-CM   1. Platypnea  R06.09     2. Chronic pulmonary aspiration, subsequent encounter  T17.908D     3. Oropharyngeal dysphagia  R13.12     4. Abnormal CT of the chest  R93.89     5. Pharyngoesophageal dysphagia  R13.14     6. Gastrostomy present (HCC)  Z93.1      Discussion:    Chronic oropharyngeal dysphagia with recurrent  aspiration and aspiration pneumonitis Chronic oropharyngeal dysphagia with recurrent aspiration leading to aspiration pneumonitis. Recent CT scan shows improvement from prior pneumonia, with residual scarring and new episodes of aspiration-related changes. Awaiting swallow test results to assess current swallowing function and determine if nutritional support via tube can be discontinued. - Await swallow test results to assess swallowing function - Continue nutritional support via tube until swallow test results are available  Platypnea (positional shortness of breath) Platypnea characterized by shortness of breath upon standing, resolving when lying down. Differential includes potential cardiac shunt or defect. Echocardiogram planned to evaluate for cardiac causes, with potential for catheter-based intervention if a defect is identified. - Ordered echocardiogram to evaluate for cardiac shunt or defect - Will consider catheter-based intervention if echocardiogram indicates a  cardiac defect  History of recurrent pneumonia Recurrent pneumonia with recent episode resolved on CT scan. Previous episodes associated with aspiration due to dysphagia. - Continue to monitor for signs of recurrent pneumonia      Advised if symptoms do not improve or worsen, to please contact office for sooner follow up or seek emergency care.    I spent 32 minutes of dedicated to the care of this patient on the date of this encounter to include pre-visit review of records, face-to-face time with the patient discussing conditions above, post visit ordering of testing, clinical documentation with the electronic health record, making appropriate referrals as documented, and communicating necessary findings to members of the patients care team.     C. Leita Sanders, MD Advanced Bronchoscopy PCCM [facility]    *This note was generated using voice recognition software/Dragon and/or AI transcription program.  Despite best efforts to proofread, errors can occur which can change the meaning. Any transcriptional errors that result from this process are unintentional and may not be fully corrected at the time of dictation.

## 2024-02-22 NOTE — Therapy (Signed)
 OUTPATIENT SPEECH LANGUAGE PATHOLOGY  SWALLOW TREATMENT    Patient Name: Shawn Lam MRN: [id number] DOB:1950/07/22, 73 y.o., male Today's Date: 02/22/2024  PCP: Greig Ring, MD REFERRING PROVIDER: Greig Ring, MD   End of Session - 02/22/24 0844     Visit Number 9    Number of Visits 25    Date for Recertification  03/25/24    Authorization Type Medicare Part A    Progress Note Due on Visit 10    SLP Start Time 0845    SLP Stop Time  0930    SLP Time Calculation (min) 45 min    Activity Tolerance Patient tolerated treatment well          Past Medical History:  Diagnosis Date   Actinic keratosis 02/04/2021   right lateral neck   Anemia    Anxiety    Asymptomatic LV dysfunction    50-55% by echo 06/2016   BCC (basal cell carcinoma of skin) 08/05/2020   left neck infra auricular - nodular pattern - treated with [facility] 09/17/2020   Berger's disease    CAD (coronary artery disease) 06/2005   PCI of LAD and RCA   Depression    Dilated aortic root    4cm at sinus of Valsalva   Hematuria    Hyperlipidemia    Hypothyroidism    Insomnia    Left ureteral stone    Orthostatic hypotension    negative tilt table   OSA (obstructive sleep apnea)    moderate with AHI 17/hr, uses CPAP nightly(01/15/19 - has Inspire implant)   PVC's (premature ventricular contractions) 07/07/2016   Renal disorder    PT IS SEEING DR. BETSEY- NEPHROLOGIST   Rosacea    Urticaria    Past Surgical History:  Procedure Laterality Date   ANTERIOR CERVICAL DECOMP/DISCECTOMY FUSION N/A 03/13/2023   Procedure: CERVICAL FOUR-FIVE, CERVICAL FIVE-SIX ANTERIOR CERVICAL DISCECTOMY/DECOMPRESSION FUSION;  Surgeon: Onetha Kuba, MD;  Location: [facility] OR;  Service: Neurosurgery;  Laterality: N/A;  3C   CARDIAC CATHETERIZATION  09/06/2013   [facility]   CARDIAC CATHETERIZATION     COLONOSCOPY WITH PROPOFOL  N/A 01/21/2019   Procedure: COLONOSCOPY WITH PROPOFOL ;  Surgeon: Jinny Carmine, MD;  Location: [facility];   Service: Endoscopy;  Laterality: N/A;  sleep apnea   COLONOSCOPY WITH PROPOFOL  N/A 10/19/2021   Procedure: COLONOSCOPY WITH PROPOFOL ;  Surgeon: Jinny Carmine, MD;  Location: [facility] ENDOSCOPY;  Service: Endoscopy;  Laterality: N/A;   CORONARY ANGIOPLASTY  2004   STENT PLACEMENT   CYSTOSCOPY WITH RETROGRADE PYELOGRAM, URETEROSCOPY AND STENT PLACEMENT Left 03/19/2014   Procedure: CYSTOSCOPY WITH RETROGRADE PYELOGRAM, URETEROSCOPY AND STENT PLACEMENT;  Surgeon: Mickey Ricardo KATHEE Alvaro, MD;  Location: [facility] ORS;  Service: Urology;  Laterality: Left;   CYSTOSCOPY WITH RETROGRADE PYELOGRAM, URETEROSCOPY AND STENT PLACEMENT Bilateral 05/20/2015   Procedure:  CYSTOSCOPY WITH BILATERAL RETROGRADE PYELOGRAM,RIGHT  DIAGNOSTIC URETEROSCOPY ,LEFT URETEROSCOPY WITH HOLMIUM LASER  AND BILATERAL  STENT PLACEMENT ;  Surgeon: Ricardo Alvaro, MD;  Location: [facility];  Service: Urology;  Laterality: Bilateral;   DRUG INDUCED ENDOSCOPY N/A 10/20/2017   Procedure: DRUG INDUCED SLEEP ENDOSCOPY;  Surgeon: Carlie Clark, MD;  Location: [facility];  Service: ENT;  Laterality: N/A;   EP IMPLANTABLE DEVICE N/A 05/13/2016   Procedure: Loop Recorder Insertion;  Surgeon: Elspeth JAYSON Sage, MD;  Location: [facility] INVASIVE CV LAB;  Service: Cardiovascular;  Laterality: N/A;   ESOPHAGOGASTRODUODENOSCOPY N/A 10/19/2021   Procedure: ESOPHAGOGASTRODUODENOSCOPY (EGD);  Surgeon: Jinny Carmine, MD;  Location: [facility]  ENDOSCOPY;  Service: Endoscopy;  Laterality: N/A;   ESOPHAGOGASTRODUODENOSCOPY (EGD) WITH PROPOFOL  N/A 11/30/2018   Procedure: ESOPHAGOGASTRODUODENOSCOPY (EGD) WITH PROPOFOL ;  Surgeon: Therisa Bi, MD;  Location: [facility] ENDOSCOPY;  Service: Gastroenterology;  Laterality: N/A;   ESOPHAGOGASTRODUODENOSCOPY (EGD) WITH PROPOFOL  N/A 12/01/2018   Procedure: ESOPHAGOGASTRODUODENOSCOPY (EGD) WITH PROPOFOL ;  Surgeon: Jinny Carmine, MD;  Location: [facility] ENDOSCOPY;  Service: Endoscopy;  Laterality: N/A;   HOLMIUM LASER APPLICATION Bilateral 05/20/2015    Procedure: HOLMIUM LASER APPLICATION;  Surgeon: Ricardo Likens, MD;  Location: [facility];  Service: Urology;  Laterality: Bilateral;   IMPLIMENTATION OF A HYPOGLOSSAL NERVE STIMULATOR  12/08/2017   OSA    IR GASTROSTOMY TUBE MOD SED  01/18/2024   IR RADIOLOGIST EVAL & MGMT  02/13/2024   LEFT HEART CATHETERIZATION WITH CORONARY ANGIOGRAM N/A 09/06/2013   Procedure: LEFT HEART CATHETERIZATION WITH CORONARY ANGIOGRAM;  Surgeon: Victory LELON Claudene DOUGLAS, MD;  Location: [facility] CATH LAB;  Service: Cardiovascular;  Laterality: N/A;   LEFT KNEE CAP  SURGERY ABOUT 40 YRS AGO  ? 1970     STONE EXTRACTION WITH BASKET Left 03/19/2014   Procedure: STONE EXTRACTION WITH BASKET;  Surgeon: Mickey Ricardo KATHEE Likens, MD;  Location: [facility];  Service: Urology;  Laterality: Left;   TRANSURETHRAL RESECTION OF PROSTATE N/A 08/25/2021   Procedure: TRANSURETHRAL RESECTION OF THE PROSTATE (TURP);  Surgeon: Likens Ricardo, MD;  Location: [facility];  Service: Urology;  Laterality: N/A;  1 HR   Patient Active Problem List   Diagnosis Date Noted   S/P gastrostomy tube (G tube) placement, follow-up exam 02/18/2024   Skin infection at gastrostomy tube site (HCC) 02/06/2024   Protein-calorie malnutrition, severe 01/23/2024   Multifocal pneumonia 01/22/2024   Fall 01/03/2024   Hematoma of right hip 01/03/2024   Chronic pulmonary aspiration 12/29/2023   Silent aspiration 12/29/2023   Moderate protein-calorie malnutrition (weight for age 70-74% of standard) 12/29/2023   Bilateral leg weakness 12/29/2023   Chronic respiratory failure with hypoxia, on home oxygen  therapy (HCC) 12/29/2023   Chronic constipation 12/29/2023   Somnolence 12/29/2023   Frequent falls 12/29/2023   Feeding difficulties, unspecified 12/20/2023   Shortness of breath 12/20/2023   Abnormal CT of the chest 10/10/2023   Elevated blood sugar 06/30/2023   Carotid stenosis 05/11/2023   Spinal stenosis in cervical region 03/13/2023   Osteoarthritis of spine with radiculopathy,  cervical region 07/14/2022   Cervicogenic headache 11/30/2021   Chronic neck pain 11/30/2021   Weight loss, non-intentional    Dysphagia    Polyp of colon    Prostatic hyperplasia 08/25/2021   Chronic insomnia 03/19/2020   Peripheral neuropathy 03/19/2020   Iron deficiency anemia 01/06/2020   Allergic dermatitis 07/08/2019   Osteoarthritis of left knee 02/26/2019   History of colonic polyps    History of duodenal ulcer    CKD (chronic kidney disease), stage IIIa    Bilateral chronic knee pain 11/09/2018   Epistaxis 10/02/2018   Subjective tinnitus, bilateral 10/02/2018   Chronic pain of right thumb 02/22/2018   Acute bilateral thoracic back pain 01/23/2018   Osteoporosis without current pathological fracture 02/10/2017   Hypothyroidism 11/02/2016   Thoracic compression fracture (HCC) 09/06/2016   PVC's (premature ventricular contractions) 07/07/2016   Hypoalbuminemia 06/28/2016   IgA nephropathy 06/07/2016   Acute buttock pain 05/23/2016   MDD (major depressive disorder), recurrent episode, moderate (HCC) 11/20/2015   OSA (obstructive sleep apnea) 02/05/2014   Orthostatic hypotension 02/07/2013   Scotoma involving central area 01/29/2013   Optic neuropathy 01/28/2013  HEMATURIA UNSPECIFIED 01/21/2010   HLD (hyperlipidemia) 07/05/2006   Generalized anxiety disorder 07/05/2006   CAD (coronary artery disease) 07/05/2006   GERD 07/05/2006    ONSET DATE: 03/2023 following ACDF; Date of referral 12/28/2023: date of this referral 02/01/2024  REFERRING DIAG:  T17.908D (ICD-10-CM) - Chronic pulmonary aspiration, subsequent encounter  T17.900D (ICD-10-CM) - Silent aspiration, subsequent encounter  E44.0 (ICD-10-CM) - Moderate protein-calorie malnutrition  R13.10 (ICD-10-CM) - Dysphagia, unspecified type    THERAPY DIAG:  Oropharyngeal dysphagia  Rationale for Evaluation and Treatment Rehabilitation  SUBJECTIVE:   PERTINENT HISTORY and DIAGNOSTIC FINDINGS:  Pt is a 73 yo  male w/ ACDF surgery 03/2023.  PMH: GERD(Not on a PPI per chart), Actinic keratosis (02/04/2021), Anemia, Anxiety, Asymptomatic LV dysfunction, BCC (basal cell carcinoma of skin) (08/05/2020), Berger's disease, CAD (coronary artery disease) (06/2005), resting tremor, Depression, Dilated aortic root (HCC), Hematuria, Hyperlipidemia, Hypothyroidism, Insomnia, Left ureteral stone, Orthostatic hypotension, OSA (obstructive sleep apnea), PVC's (premature ventricular contractions) (07/07/2016), Renal disorder, Rosacea, and Urticaria.  Pt is followed by Cardiology for Coronary Artery Stenosis currently; also followed for Migraines.     EGD in 2023 revealed Gastritis.      CT of Chest 10/2023: 1. Interim development of patchy consolidation and ground-glass   disease most evident at the right middle lobe and right lower lobes   with additional areas of mild ground-glass disease and scattered   clustered nodularity, findings are suspicious for multifocal   infection/pneumonia. Imaging follow-up to resolution is recommended.   2. Emphysema.   3. Aortic atherosclerosis.            Dysphagia - reported by pt as Chronic, ever since neck surgery in December 2024. Pt was seen in the emergency room s/p Fall, notes detailing that he slipped on soap in the shower, fell backwards on right side hit head and neck against shower seat, possible brief loss of consciousness, per chart.  ACDF 03/2023 per chart.  Since the neck surgery in 03/2023, he has noted food, liquids and pill getting stuck on the right side of my throat more.  I used to cough a lot more but not as much now.  I cough up what gets stuck..  He has lost significant weight per chart notes due to decreased eating.  Pt endorsed using his home O2 when he feels SOB.   MBSS 12/26/2023 Patient appears to present w/ Chronic, moderate-severe Pharyngeal phase dysphagia per this assessment today. It appeared there was some impact from changes in the Cervical Esophagus  s/p ACDF surgery(03/2023 per chart notes) including structural/tissue changes in/aournd the CP segment and cervical esophagus. Pt with intermittent silent aspiration of thin liquids.   Recommend continue current diet of easy to eat foods at home; thin liquids via cup- at this time. Oral care Before any oral intake. Swallow Strategies Recommended: No straws, moistened foods, SMALL bites/sips, Head Turn RIGHT w/ slight chin tuck, multiple swallows to aid clearing, AND strong Cough/throat clear b/t bites/sips and at end of meal.  D/t the Chronicity of pt's Pharyngeal phase Dysphagia w/ weight loss and pulmonary impact per recent Chest CT, pt and Family may need to discuss alternative means of nutrition/hydration as support w/ his Medical Team/PCP, while seeking swallow therapy.  Received ENT report dated 01/12/2024 Laryngoscopy revealed  Moderate vocal fold bowing bilaterally and moderate glottic gamp with phonation Impression/plan: Aspiration on MBSS and significant weight loss since ACDF last year. TVF shows moderate sized glottic gap on laryngoscoy but mobile vocal folds bilaterally. Pooling  in pyriform on MBSS but no significant pooling of secretions on laryngoscopy. I feel that between speech therpay and possibly bilateral injection laryngoplasty, hopefully his swallowing will significantly improve. Will make referral to Dr Brien at [facility].   Pt and his wife report appt with Dr Brien in January 2026.  10/10/205 PEG placed  01/22/2024 thru 01/24/2024 Hospitalized d/t fall and confusion CT of the chest on arrival showed multifocal pneumonia and possible free air. When reviewed further with interventional radiology team it was determined this was an appropriate amount of free air secondary to recent PEG tube placement. Patient was initiated on antibiotic therapy for pneumonia and improved significantly.   02/07/2024 CT Chest 1. Evolving multifocal inflammatory infiltrates, progressive  within the right upper lobe and improved within the lung bases bilaterally. Recommend appropriate clinical management and imaging follow-up per clinical course. 2. Mild emphysema. Given emphysema as an independent lung cancer risk factor, consider evaluation for a low-dose CT lung cancer screening program if the patient is 73 years old and otherwise eligible.   PAIN:  Are you having pain? No  FALLS: Has patient fallen in last 6 months?  See PT evaluation for details  LIVING ENVIRONMENT: Lives with: lives with their spouse Lives in: House/apartment  PLOF:  Level of assistance: Independent with ADLs, Independent with IADLs Employment: Retired   PATIENT GOALS  to improve swallow function, nutrition and hydration, decrease aspiration risks  SUBJECTIVE STATEMENT: Pt eager, pleasant Pt accompanied by: self   OBJECTIVE:   TODAY'S TREATMENT:  Skilled treatment session focused on pt's dysphagia goals. SLP facilitated the session by providing the following interventions:   Respiratory Muscle Strengthening: Introduced PUBLIC RELATIONS ACCOUNT EXECUTIVE and IMST with skilled education and instruction provided on both exercises.  EMST 75 set to 15 cm H2O: Pt able to complete 1 sets of 12 repetitions independently. SLP increased resistance to 25 cm H2O. Pt able to complete one set of 7 repetitions and 2 sets of 8 repetitions with supervision cues. IMST set to 25 cm H2O: Pt able to complete 3 sets of 8 repetitions with supervision cues - much improved.  Visual instructions provided for pt to add IMST to HEP - pt voiced understanding.   Skilled observation of pharyngeal strengthening exercises (as below).  Trial set 1: Pt consumed 10 spoonfuls of thin water  with head turn to right and chin tuck with supervision cues - pt with intermittent throat clear [place]: Pt able to complete 1 set of 5 repetitions with supervision cues  Trial set 2: Pt consumed about 6 spoonfuls of thin water  with continued need for initial  verbal reminder to utilize head turn to right with chin tuck -  pt with intermittent throat clear [place]: Pt able to complete 3 repetitions with supervision cues Trial set 1: Pt independently consumed 5 spoonfuls of applesauce with head turn, chin tuck, and double swallow. Pt with no overt s/s of aspiration.  Chin tuck against resistance (CTAR): pt able to complete 2 sets of 5 repetitions independently Trial set 2: Pt independently consumed 7 spoonfuls of applesauce with head turn, chin tuck, and double swallow. Pt with no overt s/s of aspiration.       Pt advised to continue on current diet - will plan to schedule another MBSS in the coming weeks.  PATIENT EDUCATION: Education details: ST POC, repeat MBSS in 8 to 12 weeks Person educated: Patient and Spouse Education method: Chief Technology Officer Education comprehension: needs further education  HOME EXERCISE PROGRAM:  See above   GOALS: Goals reviewed with patient? Yes  SHORT TERM GOALS: Target date: 10 sessions  Updated 02/09/2024 With Mod I, pt will perform compensatory swallow strategies (chin tuck, multiple swallows, head turn) to eliminate s/s of aspiration when consuming least restrictive diet. Baseline: Goal status: INITIAL: moderate A   2.  With Min A, pt will complete (supraglottic, swallow, Mednelson maneuver, effortful swallow) to improve oral motor weakness, tongue base retraction, hyolarnygeal excursion, airway protection and/or clearance of the bolus through the pharynx. Baseline:  Goal status: INITIAL: progress made; moderate A  3. Pt will demonstrate overt s/s of aspiration when consuming ice chips.   Baseline: Goal status: INITIAL: no overt s/s of aspiration observed during today's session   LONG TERM GOALS: Target date: 03/25/2024  Updated: 02/09/2024 With Mod I, pt will demonstrate the ability to adequately self-monitor swallowing skills and perform appropriate compensatory techniques to reduce s/s of  aspiration and to safely consume least restrictive diet.     Baseline:  Goal status: INITIAL: continues to be appropriate goal  2.  Pt will participate in repeat instrumental swallow evaluation to assess least restrictive diet.  Baseline:  Goal status: INITIAL: continues to be appropriate goal   ASSESSMENT:  CLINICAL IMPRESSION: Patient is a 73 y.o. male who was seen today for a clinical swallow re-evaluation d/t severe pharyngeal phase dysphagia following ACDF (03/2023).    See the above treatment note for details.   OBJECTIVE IMPAIRMENTS include dysphagia. These impairments are limiting patient from safety when swallowing. Factors affecting potential to achieve goals and functional outcome are severity of impairments and malnutrition. Patient will benefit from skilled SLP services to address above impairments and improve overall function.  REHAB POTENTIAL: Good  PLAN: SLP FREQUENCY: 1-2x/week  SLP DURATION: 12 weeks  PLANNED INTERVENTIONS: Aspiration precaution training, Pharyngeal strengthening exercises, Diet toleration management , Oral motor exercises, SLP instruction and feedback, Compensatory strategies, and Patient/family education  Hassell Roys, SLP student clinician  Happi B. Rubbie, M.S., CCC-SLP, Tree Surgeon Certified Brain Injury Specialist [facility]  [facility] Rehabilitation Services Office [phone] Ascom [phone] Fax [phone]

## 2024-02-23 ENCOUNTER — Encounter: Admitting: Speech Pathology

## 2024-02-23 ENCOUNTER — Encounter: Payer: Self-pay | Admitting: Pulmonary Disease

## 2024-02-23 ENCOUNTER — Ambulatory Visit: Admitting: Physical Therapy

## 2024-02-26 ENCOUNTER — Other Ambulatory Visit (INDEPENDENT_AMBULATORY_CARE_PROVIDER_SITE_OTHER)

## 2024-02-26 DIAGNOSIS — E782 Mixed hyperlipidemia: Secondary | ICD-10-CM

## 2024-02-26 DIAGNOSIS — E43 Unspecified severe protein-calorie malnutrition: Secondary | ICD-10-CM

## 2024-02-26 DIAGNOSIS — Z09 Encounter for follow-up examination after completed treatment for conditions other than malignant neoplasm: Secondary | ICD-10-CM

## 2024-02-26 LAB — LIPID PANEL
Cholesterol: 139 mg/dL (ref 0–200)
HDL: 64.1 mg/dL (ref >39.00)
LDL Cholesterol: 61 mg/dL (ref 0–99)
NonHDL: 75.39
Total CHOL/HDL Ratio: 2
Triglycerides: 72 mg/dL (ref 0.0–149.0)
VLDL: 14.4 mg/dL (ref 0.0–40.0)

## 2024-02-26 LAB — COMPREHENSIVE METABOLIC PANEL WITH GFR
ALT: 110 U/L — ABNORMAL HIGH (ref 0–53)
AST: 106 U/L — ABNORMAL HIGH (ref 0–37)
Albumin: 3.8 g/dL (ref 3.5–5.2)
Alkaline Phosphatase: 78 U/L (ref 39–117)
BUN: 48 mg/dL — ABNORMAL HIGH (ref 6–23)
CO2: 31 meq/L (ref 19–32)
Calcium: 9.2 mg/dL (ref 8.4–10.5)
Chloride: 98 meq/L (ref 96–112)
Creatinine, Ser: 1.46 mg/dL (ref 0.40–1.50)
GFR: 47.36 mL/min — ABNORMAL LOW (ref >60.00)
Glucose, Bld: 76 mg/dL (ref 70–99)
Potassium: 4.4 meq/L (ref 3.5–5.1)
Sodium: 137 meq/L (ref 135–145)
Total Bilirubin: 0.7 mg/dL (ref 0.2–1.2)
Total Protein: 5.7 g/dL — ABNORMAL LOW (ref 6.0–8.3)

## 2024-02-26 LAB — CBC WITH DIFFERENTIAL/PLATELET
Basophils Absolute: 0.1 K/uL (ref 0.0–0.1)
Basophils Relative: 1 % (ref 0.0–3.0)
Eosinophils Absolute: 0.8 K/uL — ABNORMAL HIGH (ref 0.0–0.7)
Eosinophils Relative: 9.4 % — ABNORMAL HIGH (ref 0.0–5.0)
HCT: 34.8 % — ABNORMAL LOW (ref 39.0–52.0)
Hemoglobin: 11.5 g/dL — ABNORMAL LOW (ref 13.0–17.0)
Lymphocytes Relative: 14.3 % (ref 12.0–46.0)
Lymphs Abs: 1.2 K/uL (ref 0.7–4.0)
MCHC: 33 g/dL (ref 30.0–36.0)
MCV: 104.1 fl — ABNORMAL HIGH (ref 78.0–100.0)
Monocytes Absolute: 0.8 K/uL (ref 0.1–1.0)
Monocytes Relative: 9.6 % (ref 3.0–12.0)
Neutro Abs: 5.7 K/uL (ref 1.4–7.7)
Neutrophils Relative %: 65.7 % (ref 43.0–77.0)
Platelets: 217 K/uL (ref 150.0–400.0)
RBC: 3.35 Mil/uL — ABNORMAL LOW (ref 4.22–5.81)
RDW: 14.6 % (ref 11.5–15.5)
WBC: 8.6 K/uL (ref 4.0–10.5)

## 2024-02-26 LAB — MAGNESIUM: Magnesium: 2.1 mg/dL (ref 1.5–2.5)

## 2024-02-26 LAB — FOLATE: Folate: 14.4 ng/mL (ref >5.9)

## 2024-02-26 LAB — VITAMIN B12: Vitamin B-12: 1211 pg/mL — ABNORMAL HIGH (ref 211–911)

## 2024-02-27 ENCOUNTER — Ambulatory Visit (INDEPENDENT_AMBULATORY_CARE_PROVIDER_SITE_OTHER): Admitting: Sleep Medicine

## 2024-02-27 ENCOUNTER — Ambulatory Visit

## 2024-02-27 ENCOUNTER — Ambulatory Visit: Admitting: Sleep Medicine

## 2024-02-27 ENCOUNTER — Encounter: Payer: Self-pay | Admitting: Sleep Medicine

## 2024-02-27 ENCOUNTER — Other Ambulatory Visit: Payer: Self-pay | Admitting: Family Medicine

## 2024-02-27 ENCOUNTER — Ambulatory Visit: Payer: Self-pay | Admitting: Family Medicine

## 2024-02-27 ENCOUNTER — Ambulatory Visit: Attending: Cardiovascular Disease

## 2024-02-27 ENCOUNTER — Other Ambulatory Visit: Payer: Self-pay | Admitting: Cardiovascular Disease

## 2024-02-27 ENCOUNTER — Encounter: Admitting: Speech Pathology

## 2024-02-27 VITALS — BP 100/60 | HR 80 | Temp 98.5°F | Ht 72.0 in | Wt 121.8 lb

## 2024-02-27 DIAGNOSIS — R0602 Shortness of breath: Secondary | ICD-10-CM | POA: Insufficient documentation

## 2024-02-27 DIAGNOSIS — Z9682 Presence of neurostimulator: Secondary | ICD-10-CM | POA: Diagnosis not present

## 2024-02-27 DIAGNOSIS — I10 Essential (primary) hypertension: Secondary | ICD-10-CM

## 2024-02-27 DIAGNOSIS — G4733 Obstructive sleep apnea (adult) (pediatric): Secondary | ICD-10-CM | POA: Diagnosis not present

## 2024-02-27 DIAGNOSIS — E43 Unspecified severe protein-calorie malnutrition: Secondary | ICD-10-CM

## 2024-02-27 DIAGNOSIS — T17900D Unspecified foreign body in respiratory tract, part unspecified causing asphyxiation, subsequent encounter: Secondary | ICD-10-CM

## 2024-02-27 DIAGNOSIS — R7401 Elevation of levels of liver transaminase levels: Secondary | ICD-10-CM

## 2024-02-27 DIAGNOSIS — Z4542 Encounter for adjustment and management of neuropacemaker (brain) (peripheral nerve) (spinal cord): Secondary | ICD-10-CM

## 2024-02-27 DIAGNOSIS — R1310 Dysphagia, unspecified: Secondary | ICD-10-CM

## 2024-02-27 DIAGNOSIS — R633 Feeding difficulties, unspecified: Secondary | ICD-10-CM

## 2024-02-27 LAB — ECHOCARDIOGRAM COMPLETE BUBBLE STUDY
AR max vel: 3.52 cm2
AV Area VTI: 3.76 cm2
AV Area mean vel: 3.64 cm2
AV Mean grad: 2 mmHg
AV Peak grad: 2.9 mmHg
Ao pk vel: 0.85 m/s
Area-P 1/2: 3.23 cm2
S' Lateral: 2.5 cm

## 2024-02-27 MED ORDER — PERFLUTREN LIPID MICROSPHERE
1.0000 mL | INTRAVENOUS | Status: AC | PRN
  Administered 2024-02-27: 2 mL via INTRAVENOUS

## 2024-02-27 NOTE — Progress Notes (Signed)
 Name:Shawn Lam MRN: [id number] DOB: 04-Apr-1951   CHIEF COMPLAINT:  INSPIRE F/U   HISTORY OF PRESENT ILLNESS: Shawn Lam is a 73 y.o. w/ a h/o OSA, HTN, hypothyroidism, anxiety, depression, CAD and COPD who presents for Inspire F/U visit. Reports using Inspire therapy every night, which is confirmed by compliance data. Patient reports that he is still snoring and is not sure if his device is working correctly. He is currently set to 1.5 V. Reports loud snoring and feeling unrefreshed upon awakening.    EPWORTH SLEEP SCORE 8    01/05/2024   10:40 AM  Results of the Epworth flowsheet  Sitting and reading 3  Watching TV 2  Sitting, inactive in a public place (e.g. a theatre or a meeting) 0  As a passenger in a car for an hour without a break 0  Lying down to rest in the afternoon when circumstances permit 3  Sitting and talking to someone 0  Sitting quietly after a lunch without alcohol 0  In a car, while stopped for a few minutes in traffic 0  Total score 8    PAST MEDICAL HISTORY :   has a past medical history of Actinic keratosis (02/04/2021), Anemia, Anxiety, Asymptomatic LV dysfunction, BCC (basal cell carcinoma of skin) (08/05/2020), Berger's disease, CAD (coronary artery disease) (06/2005), Depression, Dilated aortic root, Hematuria, Hyperlipidemia, Hypothyroidism, Insomnia, Left ureteral stone, Orthostatic hypotension, OSA (obstructive sleep apnea), PVC's (premature ventricular contractions) (07/07/2016), Renal disorder, Rosacea, and Urticaria.  has a past surgical history that includes LEFT KNEE CAP  SURGERY ABOUT 40 YRS AGO  ? 1970; Cardiac catheterization (09/06/2013); Cardiac catheterization; Coronary angioplasty (2004); left heart catheterization with coronary angiogram (N/A, 09/06/2013); Cystoscopy with retrograde pyelogram, ureteroscopy and stent placement (Left, 03/19/2014); Stone extraction with basket (Left, 03/19/2014); Cystoscopy with retrograde pyelogram,  ureteroscopy and stent placement (Bilateral, 05/20/2015); Holmium laser application (Bilateral, 05/20/2015); Cardiac catheterization (N/A, 05/13/2016); Drug induced endoscopy (N/A, 10/20/2017); IMPLIMENTATION OF A HYPOGLOSSAL NERVE STIMULATOR (12/08/2017); Esophagogastroduodenoscopy (egd) with propofol  (N/A, 11/30/2018); Esophagogastroduodenoscopy (egd) with propofol  (N/A, 12/01/2018); Colonoscopy with propofol  (N/A, 01/21/2019); Transurethral resection of prostate (N/A, 08/25/2021); Colonoscopy with propofol  (N/A, 10/19/2021); Esophagogastroduodenoscopy (N/A, 10/19/2021); Anterior cervical decomp/discectomy fusion (N/A, 03/13/2023); IR GASTROSTOMY TUBE MOD SED (01/18/2024); and IR Radiologist Eval & Mgmt (02/13/2024). Prior to Admission medications   Medication Sig Start Date End Date Taking? Authorizing Provider  aspirin  EC 81 MG tablet Take 81 mg by mouth daily. 10/02/18   [provider]  atorvastatin  (LIPITOR ) 80 MG tablet TAKE 1 TABLET BY MOUTH DAILY 08/21/23   Bedsole, Amy E, MD  baclofen (LIORESAL) 10 MG tablet Take 10 mg by mouth 2 (two) times daily. 09/19/23   [provider]  buPROPion  (WELLBUTRIN  XL) 150 MG 24 hr tablet TAKE 3 TABLETS BY MOUTH DAILY 11/13/23   Bedsole, Amy E, MD  busPIRone  (BUSPAR ) 10 MG tablet TAKE 2 TABLETS BY MOUTH 3 TIMES A DAY 11/06/23   Bedsole, Amy E, MD  Cholecalciferol  (VITAMIN D3) 50 MCG (2000 UT) TABS Take 2,000 Units by mouth daily.    [provider]  clopidogrel  (PLAVIX ) 75 MG tablet TAKE 1 TABLET BY MOUTH DAILY 06/05/23   Gerard Frederick, NP  cyanocobalamin  (VITAMIN B12) 1000 MCG tablet Take 1,000 mcg by mouth daily. 10/02/18   [provider]  denosumab  (PROLIA ) 60 MG/ML SOSY injection Inject 60 mg into the skin every 6 (six) months. Do not send RX, order this from the office    [provider]  DULoxetine  (CYMBALTA ) 30 MG capsule TAKE THREE CAPSULES BY MOUTH DAILY 10/11/23   Bedsole, Amy E, MD  Erenumab-aooe (AIMOVIG) 140 MG/ML SOAJ 140 mg  every 28 (twenty-eight) days.    [provider]  finasteride  (PROSCAR ) 5 MG tablet Take 5 mg by mouth daily. 01/22/21   [provider]  levothyroxine  (SYNTHROID ) 88 MCG tablet TAKE 1 TABLET BY MOUTH DAILY BEFORE BREAKFAST 08/28/23   Bedsole, Amy E, MD  losartan  (COZAAR ) 50 MG tablet Take 1 tablet (50 mg total) by mouth daily. 12/25/23   Gollan, Timothy J, MD  Magnesium  250 MG CAPS Take 250 mg by mouth daily.    [provider]  metoCLOPramide (REGLAN) 10 MG tablet Take 10 mg by mouth every 8 (eight) hours as needed. 12/13/23   [provider]  metoprolol  succinate (TOPROL  XL) 25 MG 24 hr tablet Take 1 tablet (25 mg total) by mouth daily. 08/09/23   Gerard Frederick, NP  nitroGLYCERIN  (NITROSTAT ) 0.4 MG SL tablet Place 1 tablet (0.4 mg total) under the tongue every 5 (five) minutes as needed for chest pain. 07/04/23   Bedsole, Amy E, MD  triamcinolone  cream (KENALOG ) 0.1 % APPLY 2 TOPICALLY DAILY , DO NOT APPLY ON DACE , GROIN AND UNDERARMS. 12/29/23   Bedsole, Amy E, MD  zolpidem  (AMBIEN ) 5 MG tablet Take 1 tablet (5 mg total) by mouth at bedtime as needed. 12/18/23   Bedsole, Amy E, MD  zonisamide  (ZONEGRAN ) 25 MG capsule Take 1 capsule (25 mg total) by mouth daily. 12/15/23   Avelina Greig BRAVO, MD   Allergies  Allergen Reactions   Codeine Nausea Only and Nausea And Vomiting   Tape Other (See Comments)    Use only paper tape. Severe blistering and bruising with other tapes. No cloth tape.    FAMILY HISTORY:  family history includes Cancer in his father; Coronary artery disease in his mother; Diabetes in his brother; Heart attack in his mother; Heart disease in his mother; Hypertension in his mother; Lung cancer in his father. SOCIAL HISTORY:  reports that he has never smoked. He has never used smokeless tobacco. He reports that he does not currently use alcohol after a past usage of about 1.0 standard drink of alcohol per week. He reports that he does not use  drugs.   Review of Systems:  Gen:  Denies  fever, sweats, chills weight loss  HEENT: Denies blurred vision, double vision, ear pain, eye pain, hearing loss, nose bleeds, sore throat Cardiac:  No dizziness, chest pain or heaviness, chest tightness,edema, No JVD Resp:   No cough, -sputum production, -shortness of breath,-wheezing, -hemoptysis,  Gi: Denies swallowing difficulty, stomach pain, nausea or vomiting, diarrhea, constipation, bowel incontinence Gu:  Denies bladder incontinence, burning urine Ext:   Denies Joint pain, stiffness or swelling Skin: Denies  skin rash, easy bruising or bleeding or hives Endoc:  Denies polyuria, polydipsia , polyphagia or weight change Psych:   Denies depression, insomnia or hallucinations  Other:  All other systems negative  VITAL SIGNS: BP 100/60   Pulse 80   Temp 98.5 F (36.9 C)   Ht 6' (1.829 m)   Wt 121 lb 12.8 oz (55.2 kg)   SpO2 100%   BMI 16.52 kg/m     Physical Examination:   General Appearance: No distress  EYES PERRLA, EOM intact.   NECK Supple, No JVD Pulmonary: normal breath sounds, No wheezing.  CardiovascularNormal S1,S2.  No m/r/g.   Abdomen: Benign, Soft, non-tender. Skin:  warm, no rashes, no ecchymosis  Extremities: normal, no cyanosis, clubbing. Neuro:without focal findings,  speech normal  PSYCHIATRIC: Mood, affect within normal limits.   ASSESSMENT AND PLAN  OSA Inspire device interrogated in office. Amplitude increased to 1.6 V, therapy duration increased to 9 hours and start delay decreased to 20 mins.  Will complete a home sleep study in 6 weeks with Inspire therapy. Discussed the consequences of untreated sleep apnea. Advised not to drive drowsy for safety of patient and others. Will complete further evaluation with a home sleep study in a few weeks and follow up to review results.    HTN Stable, on current management. Following with PCP.    MEDICATION ADJUSTMENTS/LABS AND TESTS ORDERED: Recommend Sleep  Study   Patient  satisfied with Plan of action and management. All questions answered  Follow up to review HST results and treatment plan.   I spent a total of 32 minutes reviewing chart data, face-to-face evaluation with the patient, counseling and coordination of care as detailed above.    Leshay Desaulniers, M.D.  Sleep Medicine [facility]

## 2024-02-28 ENCOUNTER — Ambulatory Visit: Admitting: Speech Pathology

## 2024-02-28 ENCOUNTER — Ambulatory Visit

## 2024-02-28 DIAGNOSIS — M542 Cervicalgia: Secondary | ICD-10-CM | POA: Diagnosis not present

## 2024-02-28 DIAGNOSIS — R2689 Other abnormalities of gait and mobility: Secondary | ICD-10-CM | POA: Diagnosis not present

## 2024-02-28 DIAGNOSIS — M546 Pain in thoracic spine: Secondary | ICD-10-CM | POA: Diagnosis not present

## 2024-02-28 DIAGNOSIS — R269 Unspecified abnormalities of gait and mobility: Secondary | ICD-10-CM | POA: Diagnosis not present

## 2024-02-28 DIAGNOSIS — R1312 Dysphagia, oropharyngeal phase: Secondary | ICD-10-CM | POA: Diagnosis not present

## 2024-02-28 DIAGNOSIS — M6281 Muscle weakness (generalized): Secondary | ICD-10-CM | POA: Diagnosis not present

## 2024-02-28 NOTE — Therapy (Signed)
 OUTPATIENT SPEECH LANGUAGE PATHOLOGY  SWALLOW TREATMENT PROGRESS NOTE    Patient Name: PAUL TRETTIN MRN: [id number] DOB:08-Apr-1951, 73 y.o., male Today's Date: 02/28/2024  PCP: Greig Ring, MD REFERRING PROVIDER: Greig Ring, MD  Speech Therapy Progress Note  Dates of Reporting Period: 01/01/24 to 02/27/2024  Objective: Patient has been seen for 10 speech therapy sessions this reporting period targeting oropharyngeal dysphagia. Patient is making progress towards his goals with repeat modified scheduled on 03/01/24. See skilled intervention, clinical impressions, and goals below for details.    End of Session - 02/28/24 0924     Visit Number 10    Number of Visits 25    Date for Recertification  03/25/24    Authorization Type Medicare Part A    Progress Note Due on Visit 10    SLP Start Time 0930    SLP Stop Time  1015    SLP Time Calculation (min) 45 min    Activity Tolerance Patient tolerated treatment well          Past Medical History:  Diagnosis Date   Actinic keratosis 02/04/2021   right lateral neck   Anemia    Anxiety    Asymptomatic LV dysfunction    50-55% by echo 06/2016   BCC (basal cell carcinoma of skin) 08/05/2020   left neck infra auricular - nodular pattern - treated with [facility] 09/17/2020   Berger's disease    CAD (coronary artery disease) 06/2005   PCI of LAD and RCA   Depression    Dilated aortic root    4cm at sinus of Valsalva   Hematuria    Hyperlipidemia    Hypothyroidism    Insomnia    Left ureteral stone    Orthostatic hypotension    negative tilt table   OSA (obstructive sleep apnea)    moderate with AHI 17/hr, uses CPAP nightly(01/15/19 - has Inspire implant)   PVC's (premature ventricular contractions) 07/07/2016   Renal disorder    PT IS SEEING DR. BETSEY- NEPHROLOGIST   Rosacea    Urticaria    Past Surgical History:  Procedure Laterality Date   ANTERIOR CERVICAL DECOMP/DISCECTOMY FUSION N/A 03/13/2023   Procedure:  CERVICAL FOUR-FIVE, CERVICAL FIVE-SIX ANTERIOR CERVICAL DISCECTOMY/DECOMPRESSION FUSION;  Surgeon: Onetha Kuba, MD;  Location: [facility] OR;  Service: Neurosurgery;  Laterality: N/A;  3C   CARDIAC CATHETERIZATION  09/06/2013   [facility]   CARDIAC CATHETERIZATION     COLONOSCOPY WITH PROPOFOL  N/A 01/21/2019   Procedure: COLONOSCOPY WITH PROPOFOL ;  Surgeon: Jinny Carmine, MD;  Location: [facility];  Service: Endoscopy;  Laterality: N/A;  sleep apnea   COLONOSCOPY WITH PROPOFOL  N/A 10/19/2021   Procedure: COLONOSCOPY WITH PROPOFOL ;  Surgeon: Jinny Carmine, MD;  Location: [facility] ENDOSCOPY;  Service: Endoscopy;  Laterality: N/A;   CORONARY ANGIOPLASTY  2004   STENT PLACEMENT   CYSTOSCOPY WITH RETROGRADE PYELOGRAM, URETEROSCOPY AND STENT PLACEMENT Left 03/19/2014   Procedure: CYSTOSCOPY WITH RETROGRADE PYELOGRAM, URETEROSCOPY AND STENT PLACEMENT;  Surgeon: Mickey Ricardo KATHEE Alvaro, MD;  Location: [facility] ORS;  Service: Urology;  Laterality: Left;   CYSTOSCOPY WITH RETROGRADE PYELOGRAM, URETEROSCOPY AND STENT PLACEMENT Bilateral 05/20/2015   Procedure:  CYSTOSCOPY WITH BILATERAL RETROGRADE PYELOGRAM,RIGHT  DIAGNOSTIC URETEROSCOPY ,LEFT URETEROSCOPY WITH HOLMIUM LASER  AND BILATERAL  STENT PLACEMENT ;  Surgeon: Ricardo Alvaro, MD;  Location: [facility];  Service: Urology;  Laterality: Bilateral;   DRUG INDUCED ENDOSCOPY N/A 10/20/2017   Procedure: DRUG INDUCED SLEEP ENDOSCOPY;  Surgeon: Carlie Clark, MD;  Location: [facility];  Service: ENT;  Laterality: N/A;   EP IMPLANTABLE DEVICE N/A 05/13/2016   Procedure: Loop Recorder Insertion;  Surgeon: Elspeth JAYSON Sage, MD;  Location: [facility] INVASIVE CV LAB;  Service: Cardiovascular;  Laterality: N/A;   ESOPHAGOGASTRODUODENOSCOPY N/A 10/19/2021   Procedure: ESOPHAGOGASTRODUODENOSCOPY (EGD);  Surgeon: Jinny Carmine, MD;  Location: [facility] ENDOSCOPY;  Service: Endoscopy;  Laterality: N/A;   ESOPHAGOGASTRODUODENOSCOPY (EGD) WITH PROPOFOL  N/A 11/30/2018   Procedure:  ESOPHAGOGASTRODUODENOSCOPY (EGD) WITH PROPOFOL ;  Surgeon: Therisa Bi, MD;  Location: [facility] ENDOSCOPY;  Service: Gastroenterology;  Laterality: N/A;   ESOPHAGOGASTRODUODENOSCOPY (EGD) WITH PROPOFOL  N/A 12/01/2018   Procedure: ESOPHAGOGASTRODUODENOSCOPY (EGD) WITH PROPOFOL ;  Surgeon: Jinny Carmine, MD;  Location: [facility] ENDOSCOPY;  Service: Endoscopy;  Laterality: N/A;   HOLMIUM LASER APPLICATION Bilateral 05/20/2015   Procedure: HOLMIUM LASER APPLICATION;  Surgeon: Ricardo Likens, MD;  Location: [facility];  Service: Urology;  Laterality: Bilateral;   IMPLIMENTATION OF A HYPOGLOSSAL NERVE STIMULATOR  12/08/2017   OSA    IR GASTROSTOMY TUBE MOD SED  01/18/2024   IR RADIOLOGIST EVAL & MGMT  02/13/2024   LEFT HEART CATHETERIZATION WITH CORONARY ANGIOGRAM N/A 09/06/2013   Procedure: LEFT HEART CATHETERIZATION WITH CORONARY ANGIOGRAM;  Surgeon: Victory LELON Claudene DOUGLAS, MD;  Location: [facility] CATH LAB;  Service: Cardiovascular;  Laterality: N/A;   LEFT KNEE CAP  SURGERY ABOUT 40 YRS AGO  ? 1970     STONE EXTRACTION WITH BASKET Left 03/19/2014   Procedure: STONE EXTRACTION WITH BASKET;  Surgeon: Mickey Ricardo KATHEE Likens, MD;  Location: [facility];  Service: Urology;  Laterality: Left;   TRANSURETHRAL RESECTION OF PROSTATE N/A 08/25/2021   Procedure: TRANSURETHRAL RESECTION OF THE PROSTATE (TURP);  Surgeon: Likens Ricardo, MD;  Location: [facility];  Service: Urology;  Laterality: N/A;  1 HR   Patient Active Problem List   Diagnosis Date Noted   S/P gastrostomy tube (G tube) placement, follow-up exam 02/18/2024   Skin infection at gastrostomy tube site (HCC) 02/06/2024   Protein-calorie malnutrition, severe 01/23/2024   Multifocal pneumonia 01/22/2024   Fall 01/03/2024   Hematoma of right hip 01/03/2024   Chronic pulmonary aspiration 12/29/2023   Silent aspiration 12/29/2023   Moderate protein-calorie malnutrition (weight for age 64-74% of standard) 12/29/2023   Bilateral leg weakness 12/29/2023   Chronic respiratory failure with  hypoxia, on home oxygen  therapy (HCC) 12/29/2023   Chronic constipation 12/29/2023   Somnolence 12/29/2023   Frequent falls 12/29/2023   Feeding difficulties, unspecified 12/20/2023   Shortness of breath 12/20/2023   Abnormal CT of the chest 10/10/2023   Elevated blood sugar 06/30/2023   Carotid stenosis 05/11/2023   Spinal stenosis in cervical region 03/13/2023   Osteoarthritis of spine with radiculopathy, cervical region 07/14/2022   Cervicogenic headache 11/30/2021   Chronic neck pain 11/30/2021   Weight loss, non-intentional    Dysphagia    Polyp of colon    Prostatic hyperplasia 08/25/2021   Chronic insomnia 03/19/2020   Peripheral neuropathy 03/19/2020   Iron deficiency anemia 01/06/2020   Allergic dermatitis 07/08/2019   Osteoarthritis of left knee 02/26/2019   History of colonic polyps    History of duodenal ulcer    CKD (chronic kidney disease), stage IIIa    Bilateral chronic knee pain 11/09/2018   Epistaxis 10/02/2018   Subjective tinnitus, bilateral 10/02/2018   Chronic pain of right thumb 02/22/2018   Acute bilateral thoracic back pain 01/23/2018   Osteoporosis without current pathological fracture 02/10/2017   Hypothyroidism 11/02/2016   Thoracic compression fracture (HCC) 09/06/2016  PVC's (premature ventricular contractions) 07/07/2016   Hypoalbuminemia 06/28/2016   IgA nephropathy 06/07/2016   Acute buttock pain 05/23/2016   MDD (major depressive disorder), recurrent episode, moderate (HCC) 11/20/2015   OSA (obstructive sleep apnea) 02/05/2014   Orthostatic hypotension 02/07/2013   Scotoma involving central area 01/29/2013   Optic neuropathy 01/28/2013   HEMATURIA UNSPECIFIED 01/21/2010   HLD (hyperlipidemia) 07/05/2006   Generalized anxiety disorder 07/05/2006   CAD (coronary artery disease) 07/05/2006   GERD 07/05/2006    ONSET DATE: 03/2023 following ACDF; Date of referral 12/28/2023: date of this referral 02/01/2024  REFERRING DIAG:  T17.908D  (ICD-10-CM) - Chronic pulmonary aspiration, subsequent encounter  T17.900D (ICD-10-CM) - Silent aspiration, subsequent encounter  E44.0 (ICD-10-CM) - Moderate protein-calorie malnutrition  R13.10 (ICD-10-CM) - Dysphagia, unspecified type    THERAPY DIAG:  Oropharyngeal dysphagia  Rationale for Evaluation and Treatment Rehabilitation  SUBJECTIVE:   PERTINENT HISTORY and DIAGNOSTIC FINDINGS:  Pt is a 73 yo male w/ ACDF surgery 03/2023.  PMH: GERD(Not on a PPI per chart), Actinic keratosis (02/04/2021), Anemia, Anxiety, Asymptomatic LV dysfunction, BCC (basal cell carcinoma of skin) (08/05/2020), Berger's disease, CAD (coronary artery disease) (06/2005), resting tremor, Depression, Dilated aortic root (HCC), Hematuria, Hyperlipidemia, Hypothyroidism, Insomnia, Left ureteral stone, Orthostatic hypotension, OSA (obstructive sleep apnea), PVC's (premature ventricular contractions) (07/07/2016), Renal disorder, Rosacea, and Urticaria.  Pt is followed by Cardiology for Coronary Artery Stenosis currently; also followed for Migraines.     EGD in 2023 revealed Gastritis.      CT of Chest 10/2023: 1. Interim development of patchy consolidation and ground-glass   disease most evident at the right middle lobe and right lower lobes   with additional areas of mild ground-glass disease and scattered   clustered nodularity, findings are suspicious for multifocal   infection/pneumonia. Imaging follow-up to resolution is recommended.   2. Emphysema.   3. Aortic atherosclerosis.            Dysphagia - reported by pt as Chronic, ever since neck surgery in December 2024. Pt was seen in the emergency room s/p Fall, notes detailing that he slipped on soap in the shower, fell backwards on right side hit head and neck against shower seat, possible brief loss of consciousness, per chart.  ACDF 03/2023 per chart.  Since the neck surgery in 03/2023, he has noted food, liquids and pill getting stuck on the right side of my  throat more.  I used to cough a lot more but not as much now.  I cough up what gets stuck..  He has lost significant weight per chart notes due to decreased eating.  Pt endorsed using his home O2 when he feels SOB.   MBSS 12/26/2023 Patient appears to present w/ Chronic, moderate-severe Pharyngeal phase dysphagia per this assessment today. It appeared there was some impact from changes in the Cervical Esophagus s/p ACDF surgery(03/2023 per chart notes) including structural/tissue changes in/aournd the CP segment and cervical esophagus. Pt with intermittent silent aspiration of thin liquids.   Recommend continue current diet of easy to eat foods at home; thin liquids via cup- at this time. Oral care Before any oral intake. Swallow Strategies Recommended: No straws, moistened foods, SMALL bites/sips, Head Turn RIGHT w/ slight chin tuck, multiple swallows to aid clearing, AND strong Cough/throat clear b/t bites/sips and at end of meal.  D/t the Chronicity of pt's Pharyngeal phase Dysphagia w/ weight loss and pulmonary impact per recent Chest CT, pt and Family may need to discuss alternative means of  nutrition/hydration as support w/ his Medical Team/PCP, while seeking swallow therapy.  Received ENT report dated 01/12/2024 Laryngoscopy revealed  Moderate vocal fold bowing bilaterally and moderate glottic gamp with phonation Impression/plan: Aspiration on MBSS and significant weight loss since ACDF last year. TVF shows moderate sized glottic gap on laryngoscoy but mobile vocal folds bilaterally. Pooling in pyriform on MBSS but no significant pooling of secretions on laryngoscopy. I feel that between speech therpay and possibly bilateral injection laryngoplasty, hopefully his swallowing will significantly improve. Will make referral to [facility] Brien at [facility].   Pt and his wife report appt with Dr Brien in January 2026.  10/10/205 PEG placed  01/22/2024 thru 01/24/2024 Hospitalized d/t fall and  confusion CT of the chest on arrival showed multifocal pneumonia and possible free air. When reviewed further with interventional radiology team it was determined this was an appropriate amount of free air secondary to recent PEG tube placement. Patient was initiated on antibiotic therapy for pneumonia and improved significantly.   02/07/2024 CT Chest 1. Evolving multifocal inflammatory infiltrates, progressive within the right upper lobe and improved within the lung bases bilaterally. Recommend appropriate clinical management and imaging follow-up per clinical course. 2. Mild emphysema. Given emphysema as an independent lung cancer risk factor, consider evaluation for a low-dose CT lung cancer screening program if the patient is 73 years old and otherwise eligible.   PAIN:  Are you having pain? No  FALLS: Has patient fallen in last 6 months?  See PT evaluation for details  LIVING ENVIRONMENT: Lives with: lives with their spouse Lives in: House/apartment  PLOF:  Level of assistance: Independent with ADLs, Independent with IADLs Employment: Retired   PATIENT GOALS  to improve swallow function, nutrition and hydration, decrease aspiration risks  SUBJECTIVE STATEMENT: Pt eager, pleasant Pt accompanied by: self   OBJECTIVE:   TODAY'S TREATMENT:  Skilled treatment session focused on pt's dysphagia goals. SLP facilitated the session by providing the following interventions:   Respiratory Muscle Strengthening: Introduced PUBLIC RELATIONS ACCOUNT EXECUTIVE and IMST with skilled education and instruction provided on both exercises.  EMST 75 set to 25 cm H2O: Pt able to complete 1 set of 6 repetitions independently. SLP increased resistance to 35 cm H2O. Pt able to complete one set of 7 repetitions and 2 sets of 8 repetitions with supervision cues. IMST set to 35 cm H2O: Pt able to complete 3 sets of 6 repetitions with mod faded to supervision cues   Skilled observation of pharyngeal strengthening exercises (as  below). Pt with timely completion of each exercise and increased independence- much improved Trial set 1: Pt consumed 10 spoonfuls of thin water  with head turn to right and chin tuck independently - pt with intermittent throat clear [place]: Pt able to complete 2 sets of 8 repetitions independently. Trial set 1: Pt independently consumed 6 spoonfuls of applesauce with head turn, chin tuck, and double swallow. Pt with no overt s/s of aspiration.  Chin tuck against resistance (CTAR): pt able to complete 1 set of 8 repetitions independently Trial set 2: Pt independently consumed 4 spoonfuls of applesauce with head turn, chin tuck, and double swallow. Pt with no overt s/s of aspiration.  Chin tuck against resistance (CTAR): pt able to complete 1 set of 8 repetitions independently Trial set 2: Pt consumed about 8 spoonfuls of thin water  with head turn to right and chin tuck independently - pt with intermittent throat clear      Pt advised to continue on current diet - MBSS scheduled  for this Friday, 03/01/2024   PATIENT EDUCATION: Education details: ST POC, repeat MBSS in 8 to 12 weeks Person educated: Patient and Spouse Education method: Explanation and Handouts Education comprehension: needs further education  HOME EXERCISE PROGRAM:     See above   GOALS: Goals reviewed with patient? Yes  SHORT TERM GOALS: Target date: 10 sessions  Updated 02/28/2024  Updated 02/09/2024 With Mod I, pt will perform compensatory swallow strategies (chin tuck, multiple swallows, head turn) to eliminate s/s of aspiration when consuming least restrictive diet. Baseline: Goal status: INITIAL: moderate A: Progress made, fluctuating between supervision and independent cues  2.  With Min A, pt will complete (supraglottic, swallow, Mednelson maneuver, effortful swallow) to improve oral motor weakness, tongue base retraction, hyolarnygeal excursion, airway protection and/or clearance of the bolus through the  pharynx. Baseline:  Goal status: INITIAL: progress made; moderate A: Progress made, fluctuating between supervision and independent cues  3. Pt will demonstrate overt s/s of aspiration when consuming ice chips.   Baseline: Goal status: INITIAL: no overt s/s of aspiration observed during today's session: Intermittent overt s/s observed   LONG TERM GOALS: Target date: 03/25/2024  Updated 02/28/2024  Updated: 02/09/2024 With Mod I, pt will demonstrate the ability to adequately self-monitor swallowing skills and perform appropriate compensatory techniques to reduce s/s of aspiration and to safely consume least restrictive diet.     Baseline:  Goal status: INITIAL: continues to be appropriate goal  2.  Pt will participate in repeat instrumental swallow evaluation to assess least restrictive diet.  Baseline:  Goal status: INITIAL: continues to be appropriate goal: Modified Barium scheduled for 11/21   ASSESSMENT:  CLINICAL IMPRESSION: Patient is a 73 y.o. male who was seen today for a clinical swallow re-evaluation d/t severe pharyngeal phase dysphagia following ACDF (03/2023).    Pt making slow and steady progress with modified schedule. See the above treatment note for details.   OBJECTIVE IMPAIRMENTS include dysphagia. These impairments are limiting patient from safety when swallowing. Factors affecting potential to achieve goals and functional outcome are severity of impairments and malnutrition. Patient will benefit from skilled SLP services to address above impairments and improve overall function.  REHAB POTENTIAL: Good  PLAN: SLP FREQUENCY: 1-2x/week  SLP DURATION: 12 weeks  PLANNED INTERVENTIONS: Aspiration precaution training, Pharyngeal strengthening exercises, Diet toleration management , Oral motor exercises, SLP instruction and feedback, Compensatory strategies, and Patient/family education  Hassell Roys, SLP student clinician  Happi B. Rubbie, M.S., CCC-SLP,  Tree Surgeon Certified Brain Injury Specialist [facility]  [facility] Services Office [phone] Ascom [phone] Fax [phone]

## 2024-02-28 NOTE — Therapy (Signed)
 OUTPATIENT PHYSICAL THERAPY TREATMENT  Patient Name: Shawn Lam MRN: [id number] DOB:1950/06/21, 73 y.o., male Today's Date: 02/28/2024  END OF SESSION:  PT End of Session - 02/28/24 0849     Visit Number 9    Number of Visits 24    Date for Recertification  03/13/24    Authorization Type Medicare 2025    Authorization Time Period 12/20/23-03/13/24    Progress Note Due on Visit 10    PT Start Time 0845    PT Stop Time 0925    PT Time Calculation (min) 40 min    Equipment Utilized During Treatment Gait belt    Activity Tolerance Patient tolerated treatment well;No increased pain;Treatment limited secondary to medical complications (Comment)    Behavior During Therapy [facility] for tasks assessed/performed           Past Medical History:  Diagnosis Date   Actinic keratosis 02/04/2021   right lateral neck   Anemia    Anxiety    Asymptomatic LV dysfunction    50-55% by echo 06/2016   BCC (basal cell carcinoma of skin) 08/05/2020   left neck infra auricular - nodular pattern - treated with [facility] 09/17/2020   Berger's disease    CAD (coronary artery disease) 06/2005   PCI of LAD and RCA   Depression    Dilated aortic root    4cm at sinus of Valsalva   Hematuria    Hyperlipidemia    Hypothyroidism    Insomnia    Left ureteral stone    Orthostatic hypotension    negative tilt table   OSA (obstructive sleep apnea)    moderate with AHI 17/hr, uses CPAP nightly(01/15/19 - has Inspire implant)   PVC's (premature ventricular contractions) 07/07/2016   Renal disorder    PT IS SEEING DR. BETSEY- NEPHROLOGIST   Rosacea    Urticaria    Past Surgical History:  Procedure Laterality Date   ANTERIOR CERVICAL DECOMP/DISCECTOMY FUSION N/A 03/13/2023   Procedure: CERVICAL FOUR-FIVE, CERVICAL FIVE-SIX ANTERIOR CERVICAL DISCECTOMY/DECOMPRESSION FUSION;  Surgeon: Onetha Kuba, MD;  Location: [facility] OR;  Service: Neurosurgery;  Laterality: N/A;  3C   CARDIAC CATHETERIZATION  09/06/2013   [facility]    CARDIAC CATHETERIZATION     COLONOSCOPY WITH PROPOFOL  N/A 01/21/2019   Procedure: COLONOSCOPY WITH PROPOFOL ;  Surgeon: Jinny Carmine, MD;  Location: [facility];  Service: Endoscopy;  Laterality: N/A;  sleep apnea   COLONOSCOPY WITH PROPOFOL  N/A 10/19/2021   Procedure: COLONOSCOPY WITH PROPOFOL ;  Surgeon: Jinny Carmine, MD;  Location: [facility] ENDOSCOPY;  Service: Endoscopy;  Laterality: N/A;   CORONARY ANGIOPLASTY  2004   STENT PLACEMENT   CYSTOSCOPY WITH RETROGRADE PYELOGRAM, URETEROSCOPY AND STENT PLACEMENT Left 03/19/2014   Procedure: CYSTOSCOPY WITH RETROGRADE PYELOGRAM, URETEROSCOPY AND STENT PLACEMENT;  Surgeon: Mickey Ricardo KATHEE Alvaro, MD;  Location: [facility] ORS;  Service: Urology;  Laterality: Left;   CYSTOSCOPY WITH RETROGRADE PYELOGRAM, URETEROSCOPY AND STENT PLACEMENT Bilateral 05/20/2015   Procedure:  CYSTOSCOPY WITH BILATERAL RETROGRADE PYELOGRAM,RIGHT  DIAGNOSTIC URETEROSCOPY ,LEFT URETEROSCOPY WITH HOLMIUM LASER  AND BILATERAL  STENT PLACEMENT ;  Surgeon: Ricardo Alvaro, MD;  Location: [facility];  Service: Urology;  Laterality: Bilateral;   DRUG INDUCED ENDOSCOPY N/A 10/20/2017   Procedure: DRUG INDUCED SLEEP ENDOSCOPY;  Surgeon: Carlie Clark, MD;  Location: [facility];  Service: ENT;  Laterality: N/A;   EP IMPLANTABLE DEVICE N/A 05/13/2016   Procedure: Loop Recorder Insertion;  Surgeon: Elspeth JAYSON Sage, MD;  Location: [facility];  Service:  Cardiovascular;  Laterality: N/A;   ESOPHAGOGASTRODUODENOSCOPY N/A 10/19/2021   Procedure: ESOPHAGOGASTRODUODENOSCOPY (EGD);  Surgeon: Jinny Carmine, MD;  Location: [facility] ENDOSCOPY;  Service: Endoscopy;  Laterality: N/A;   ESOPHAGOGASTRODUODENOSCOPY (EGD) WITH PROPOFOL  N/A 11/30/2018   Procedure: ESOPHAGOGASTRODUODENOSCOPY (EGD) WITH PROPOFOL ;  Surgeon: Therisa Bi, MD;  Location: [facility];  Service: Gastroenterology;  Laterality: N/A;   ESOPHAGOGASTRODUODENOSCOPY (EGD) WITH PROPOFOL  N/A 12/01/2018   Procedure: ESOPHAGOGASTRODUODENOSCOPY  (EGD) WITH PROPOFOL ;  Surgeon: Jinny Carmine, MD;  Location: [facility] ENDOSCOPY;  Service: Endoscopy;  Laterality: N/A;   HOLMIUM LASER APPLICATION Bilateral 05/20/2015   Procedure: HOLMIUM LASER APPLICATION;  Surgeon: Ricardo Likens, MD;  Location: [facility];  Service: Urology;  Laterality: Bilateral;   IMPLIMENTATION OF A HYPOGLOSSAL NERVE STIMULATOR  12/08/2017   OSA    IR GASTROSTOMY TUBE MOD SED  01/18/2024   IR RADIOLOGIST EVAL & MGMT  02/13/2024   LEFT HEART CATHETERIZATION WITH CORONARY ANGIOGRAM N/A 09/06/2013   Procedure: LEFT HEART CATHETERIZATION WITH CORONARY ANGIOGRAM;  Surgeon: Victory LELON Claudene DOUGLAS, MD;  Location: [facility] C[facility];  Service: Cardiovascular;  Laterality: N/A;   LEFT KNEE CAP  SURGERY ABOUT 40 YRS AGO  ? 1970     STONE EXTRACTION WITH BASKET Left 03/19/2014   Procedure: STONE EXTRACTION WITH BASKET;  Surgeon: Mickey Ricardo KATHEE Likens, MD;  Location: [facility];  Service: Urology;  Laterality: Left;   TRANSURETHRAL RESECTION OF PROSTATE N/A 08/25/2021   Procedure: TRANSURETHRAL RESECTION OF THE PROSTATE (TURP);  Surgeon: Likens Ricardo, MD;  Location: [facility];  Service: Urology;  Laterality: N/A;  1 HR   Patient Active Problem List   Diagnosis Date Noted   S/P gastrostomy tube (G tube) placement, follow-up exam 02/18/2024   Skin infection at gastrostomy tube site (HCC) 02/06/2024   Protein-calorie malnutrition, severe 01/23/2024   Multifocal pneumonia 01/22/2024   Fall 01/03/2024   Hematoma of right hip 01/03/2024   Chronic pulmonary aspiration 12/29/2023   Silent aspiration 12/29/2023   Moderate protein-calorie malnutrition (weight for age 62-74% of standard) 12/29/2023   Bilateral leg weakness 12/29/2023   Chronic respiratory failure with hypoxia, on home oxygen  therapy (HCC) 12/29/2023   Chronic constipation 12/29/2023   Somnolence 12/29/2023   Frequent falls 12/29/2023   Feeding difficulties, unspecified 12/20/2023   Shortness of breath 12/20/2023   Abnormal CT of the chest  10/10/2023   Elevated blood sugar 06/30/2023   Carotid stenosis 05/11/2023   Spinal stenosis in cervical region 03/13/2023   Osteoarthritis of spine with radiculopathy, cervical region 07/14/2022   Cervicogenic headache 11/30/2021   Chronic neck pain 11/30/2021   Weight loss, non-intentional    Dysphagia    Polyp of colon    Prostatic hyperplasia 08/25/2021   Chronic insomnia 03/19/2020   Peripheral neuropathy 03/19/2020   Iron deficiency anemia 01/06/2020   Allergic dermatitis 07/08/2019   Osteoarthritis of left knee 02/26/2019   History of colonic polyps    History of duodenal ulcer    CKD (chronic kidney disease), stage IIIa    Bilateral chronic knee pain 11/09/2018   Epistaxis 10/02/2018   Subjective tinnitus, bilateral 10/02/2018   Chronic pain of right thumb 02/22/2018   Acute bilateral thoracic back pain 01/23/2018   Osteoporosis without current pathological fracture 02/10/2017   Hypothyroidism 11/02/2016   Thoracic compression fracture (HCC) 09/06/2016   PVC's (premature ventricular contractions) 07/07/2016   Hypoalbuminemia 06/28/2016   IgA nephropathy 06/07/2016   Acute buttock pain 05/23/2016   MDD (major depressive disorder), recurrent episode, moderate (HCC) 11/20/2015  OSA (obstructive sleep apnea) 02/05/2014   Orthostatic hypotension 02/07/2013   Scotoma involving central area 01/29/2013   Optic neuropathy 01/28/2013   HEMATURIA UNSPECIFIED 01/21/2010   HLD (hyperlipidemia) 07/05/2006   Generalized anxiety disorder 07/05/2006   CAD (coronary artery disease) 07/05/2006   GERD 07/05/2006    PCP: Dr. Greig Ring    REFERRING PROVIDER: Dr. Arley Helling   REFERRING DIAG: M54.6 (ICD-10-CM) - Acute bilateral thoracic back pain  THERAPY DIAG:  Pain in thoracic spine  Cervicalgia  Rationale for Evaluation and Treatment: Rehabilitation  ONSET DATE: May 2025    SUBJECTIVE:                                                                                                                                                                                                          SUBJECTIVE STATEMENT: Pulmonology visit went well. Still having dyspnea upon standing. No falls since last visit. One near-fall. Pt has been trying to walk more at home and work on his balance.    PERTINENT HISTORY:  73yoM referred to physical therapy for ongoing neck pain following cervical fusion in May 2020 and newly developed thoracic pain from T12 compression fracture that occurred sometime in Spring of 2025 (associated radicular pain pattern). Pt has pain localized to upper traps and suboccipitals. Pt attempted DC of prednisone  and gabapentin  two weeks prior to evaluation here, severe decline in function due to pain, barely able to walk for two weeks. He describes feeling increased fatigue and weakness to the point where he struggled to walk. Pt also reported repeated falls in the setting of orthostatic hypotension with him experiencing syncope when he stands up from a seated position. He describes feeling a burning sensation that is also localized to his scapulae and that it occurs when he is doing UE activities like washing his car that is likely the radicular pain from thoracic vertebrae compression fracture. PAIN:  Are you having pain? 9/10 burning low back pain on arrival, down to 2/10 15 minutes into session.   PRECAUTIONS: None Pt has thoracic compression fractures and is s/p cervical spine surgery   WEIGHT BEARING RESTRICTIONS: No  FALLS:  Has patient fallen in last 6 months? Yes. Number of falls 6, pt reports that falls are happening in the context of syncope when standing up from a seated position (Orthostatic Hypotension) , where he blacks out and falls. He is following up with his PCP, Dr. Karlene who is working with him on stabilizing his blood pressure and she has recently taken him off Gabapentin  to see  if this helps. Pt reports that he does note an improvement in his  symptoms with less light headedness since going off medication.   LIVING ENVIRONMENT: Lives with: lives with their spouse Lives in: House/apartment Stairs: No Has following equipment at home: Single point cane and Environmental Consultant - 4 wheeled  OCCUPATION: Retired     PLOF: Independent  PATIENT GOALS: Improve balance, decrease falls.       OBJECTIVE:  Note: Objective measures were completed at evaluation unless otherwise noted.  Reassessment Measures 02/09/24: -eyes closed balance, normal stance: <5sec with retropulsion each time, ModA falls recovery  -narrow stance eyes open balance: <10sec with LOB, ModA falls recovery  -Long distance AMB assessment c SPC: 123ft with 2 DOE standing breaks ~15 seconds each, 15sec; crossover gait intermittently without LOB; -standing vitals taken at end of walk: stable and comparable to prior seated vitals.  -cervical rotation ROM: 50deg Rt, 53 degree left  -shoulder flexion MMT: 5/5 Rt, 4+/5 left; shoulder ABDCT MMT: 5/5 bilat  -ankle DF assessment: can perform >10x full range while leaning against wall (adequate for gait needs)  -sensory testing about ankles, lower legs: (feels intact, but pt reports constance N/T below ankle to his toes)  Standing BP assessment: 02/28/24 -124/14mmHg 95bpm standing @ 0 -122/75mmHg 90bpm standing @ 1 110/16mmHg 92bpm standing @ 2  TREATMENT DATE 02/28/24 :  -105/28mmHg 75bpm (seated) (took midodrine  2 hours prior)   NUSTEP seat 10, arms arms 12, level 4, x5 minutes, goals of >70SPM  At 5 minutes, vitals are assessed: 83bpm, 100%SpO2  *No SOB, bumped to level 5 Performed another 5 minutes, level 5: seat 10, arms arms 12,  goals of >70SPM  Post vitals: 120/63mmHg, 85bpm *able to increase Nustep to level 5 today for first time, up to level 3 previous session.  -repeated extension over chair back (arms crossed) x20 (sitting on airex pad) -STS from chair + airex, x10, hands free (no LOB)  -seated cable row 1x15 @  22.5lb  -cable resisted walking: backward concentric, forward eccentric 12.5lb: 3x64ft   -repeated extension over chair back (arms crossed) x20 (sitting on airex pad) -STS from chair + airex, x10, hands free (no LOB)  -seated cable row 1x15 @ 22.5lb  -cable resisted walking: forward concentric, backward eccentric 12.5lb: 3x43ft   Standing BP assessment: 02/28/24 -124/92mmHg 95bpm standing @ 0 -122/3mmHg 90bpm standing @ 1 110/53mmHg 92bpm standing @ 2  -seated cable row 1x15 @ 22.5lb  -STS hands free x8 from chair (no airex)   -lateal stepping in //bars over 2 airex pads and 1 half roll 5x bilat, intermittent 1 hand support  PATIENT EDUCATION:  Education details: Pt progressing so far with cardio and balance activity.     Person educated: Patient Education method: Medical Illustrator Education comprehension: verbalized understanding, returned demonstration, and verbal cues required  HOME EXERCISE PROGRAM:  Need to create new HEP for balance once pt is ready. (Formally DC neck HEP on 02/09/24)   ASSESSMENT:  CLINICAL IMPRESSION: Continued to weave an intricate web of cardiorespiratory, postural strengthening, and balance interventions. Today, these are squeezed together more tightly as pt requires less breaks between activity. Pt able to again advance resistance/intensity on Nustep, HR response continues to increase, but remains lower than expected, highest levels in mid 80s. Patient will benefit from skilled physical therapy intervention to reduce deficits and impairments identified in evaluation, in order to reduce pain, improve quality of life, and maximize activity tolerance for ADL, IADL, and leisure/fitness. Physical  therapy will help pt achieve long and short term goals of care.    OBJECTIVE IMPAIRMENTS: decreased balance, decreased ROM, decreased strength, hypomobility, impaired flexibility, impaired UE functional use, and pain.   ACTIVITY LIMITATIONS: carrying,  lifting, standing, bathing, dressing, reach over head, and hygiene/grooming PARTICIPATION LIMITATIONS: cleaning, laundry, shopping, community activity, and yard work PERSONAL FACTORS: Age and 3+ comorbidities: CAD, Depression, are also affecting patient's functional outcome.  REHAB POTENTIAL: Good CLINICAL DECISION MAKING: Stable/uncomplicated EVALUATION COMPLEXITY: Low   GOALS: Goals reviewed with patient? No  SHORT TERM GOALS: Target date: 01/03/2024  Patient will demonstrate undestanding of home exercise plan by performing exercises correctly with evidence of good carry over with min to no verbal or tactile cues .   Baseline: NT 12/25/23: Performing exercises independently  Goal status: ACHIEVED   2.  Patient will demonstrate understanding of how to modify exercises to decrease thoracic and cervical pain exacerbation for improved function and to decrease incidence of pain flare ups.  Baseline: Does not currently have strategy to manage this  Goal status: NOT MET      LONG TERM GOALS: Target date: 03/13/2024  Patient will show a statistically significant improvement in her neck function as evidence by >=7.5 pt decrease in her neck disability index score to better be able to move neck to improve visual field while driving to avoid accidents. Vernel et al, 2009) Baseline: 33/50 (66%); 02/09/24: 66% Goal status: NOT MET     2.  Patient will increase cervical mobility by  >=5 degrees AROM for all planes of motion (flexion, extension, side bending, and rotation) for improved cervical function and evidence of pain reduction. Baseline: Cervical AROM Flex 30, Ext 30, Rot R/L 40/40, SB R/L 35/35; 02/09/24: Rt rotation50 degrees, Left rotation: 53 degress    Goal status: MET    3.  Patient will improve shoulder and periscapular strength by 1/3 grade MMT (4- to 4) for improved cervical stability and UE function to carry out upper extremity activities like washing his car and gardening without  being limited by pain and discomfort.  Baseline: Shoulder Flex R/L 4/4, Shoulder Abd R/L 4/4; 10/31: shoulder flexion Rt: 5/5, left 4+/5; shoulder ABDCT 5/5 bilat  Goal status: MET    4.  Patient will reduce neck and thoracic pain to <=4/10 NRPS while performing UE activities like washing his car and doing yard work as evidence of improved cervical and thoracic function.  Baseline: 10/10 cervical paraspinal and rhomboid pain with UE activity; up to a 9/10 intermittently with activity   Goal status: NOT MET    PLAN:  PT FREQUENCY: 1-2x/week PT DURATION: 12 weeks PLANNED INTERVENTIONS: 97164- PT Re-evaluation, 97750- Physical Performance Testing, 97110-Therapeutic exercises, 97530- Therapeutic activity, [id number]- Neuromuscular re-education, 97535- Self Care, [id number]- Manual therapy, [id number]- Gait training, [phone]- Canalith repositioning, [id number]- Aquatic Therapy, [id number]- Electrical stimulation (unattended), [id number]- Electrical stimulation (manual), [id number]- Traction (mechanical), 20560 (1-2 muscles), 20561 (3+ muscles)- Dry Needling, Patient/Family education, Balance training, Joint mobilization, Joint manipulation, Spinal manipulation, Spinal mobilization, Vestibular training, DME instructions, Cryotherapy, and Moist heat  PLAN FOR NEXT SESSION:   address global weakness, deconditioning, and imbalance.   8:52 AM, 02/28/24 Peggye JAYSON Linear, PT, DPT Physical Therapist - [facility]  Outpatient Physical Therapy- Main Campus [phone]

## 2024-02-29 ENCOUNTER — Encounter: Admitting: Speech Pathology

## 2024-02-29 ENCOUNTER — Ambulatory Visit

## 2024-03-01 ENCOUNTER — Ambulatory Visit: Admitting: Physical Therapy

## 2024-03-01 ENCOUNTER — Ambulatory Visit
Admission: RE | Admit: 2024-03-01 | Discharge: 2024-03-01 | Disposition: A | Discharge status: Home / Self Care | Source: Ambulatory Visit | Attending: Family Medicine | Admitting: Family Medicine

## 2024-03-01 ENCOUNTER — Other Ambulatory Visit (HOSPITAL_COMMUNITY): Payer: Self-pay

## 2024-03-01 ENCOUNTER — Encounter: Payer: Self-pay | Admitting: Physical Therapy

## 2024-03-01 ENCOUNTER — Encounter: Payer: Self-pay | Admitting: Cardiovascular Disease

## 2024-03-01 ENCOUNTER — Ambulatory Visit: Admitting: Speech Pathology

## 2024-03-01 DIAGNOSIS — M542 Cervicalgia: Secondary | ICD-10-CM | POA: Diagnosis not present

## 2024-03-01 DIAGNOSIS — Z981 Arthrodesis status: Secondary | ICD-10-CM | POA: Diagnosis not present

## 2024-03-01 DIAGNOSIS — R2689 Other abnormalities of gait and mobility: Secondary | ICD-10-CM | POA: Diagnosis not present

## 2024-03-01 DIAGNOSIS — M546 Pain in thoracic spine: Secondary | ICD-10-CM

## 2024-03-01 DIAGNOSIS — R1312 Dysphagia, oropharyngeal phase: Secondary | ICD-10-CM

## 2024-03-01 DIAGNOSIS — R1310 Dysphagia, unspecified: Secondary | ICD-10-CM | POA: Diagnosis not present

## 2024-03-01 DIAGNOSIS — R269 Unspecified abnormalities of gait and mobility: Secondary | ICD-10-CM | POA: Diagnosis not present

## 2024-03-01 DIAGNOSIS — R633 Feeding difficulties, unspecified: Secondary | ICD-10-CM | POA: Insufficient documentation

## 2024-03-01 DIAGNOSIS — T17320A Food in larynx causing asphyxiation, initial encounter: Secondary | ICD-10-CM | POA: Diagnosis not present

## 2024-03-01 DIAGNOSIS — M6281 Muscle weakness (generalized): Secondary | ICD-10-CM | POA: Diagnosis not present

## 2024-03-01 NOTE — Therapy (Signed)
 OUTPATIENT PHYSICAL THERAPY TREATMENT PHYSICAL THERAPY PROGRESS NOTE   Dates of reporting period  12/20/23   to   03/01/24     Patient Name: Shawn Lam MRN: [id number] DOB:July 25, 1950, 73 y.o., male Today's Date: 03/01/2024  END OF SESSION:  PT End of Session - 03/01/24 0806     Visit Number 10    Number of Visits 24    Date for Recertification  03/13/24    PT Start Time 0805    PT Stop Time 0845    PT Time Calculation (min) 40 min    Equipment Utilized During Treatment Gait belt    Activity Tolerance Patient tolerated treatment well    Behavior During Therapy [facility] for tasks assessed/performed           Past Medical History:  Diagnosis Date   Actinic keratosis 02/04/2021   right lateral neck   Anemia    Anxiety    Asymptomatic LV dysfunction    50-55% by echo 06/2016   BCC (basal cell carcinoma of skin) 08/05/2020   left neck infra auricular - nodular pattern - treated with [facility] 09/17/2020   Berger's disease    CAD (coronary artery disease) 06/2005   PCI of LAD and RCA   Depression    Dilated aortic root    4cm at sinus of Valsalva   Hematuria    Hyperlipidemia    Hypothyroidism    Insomnia    Left ureteral stone    Orthostatic hypotension    negative tilt table   OSA (obstructive sleep apnea)    moderate with AHI 17/hr, uses CPAP nightly(01/15/19 - has Inspire implant)   PVC's (premature ventricular contractions) 07/07/2016   Renal disorder    PT IS SEEING DR. BETSEY- NEPHROLOGIST   Rosacea    Urticaria    Past Surgical History:  Procedure Laterality Date   ANTERIOR CERVICAL DECOMP/DISCECTOMY FUSION N/A 03/13/2023   Procedure: CERVICAL FOUR-FIVE, CERVICAL FIVE-SIX ANTERIOR CERVICAL DISCECTOMY/DECOMPRESSION FUSION;  Surgeon: Onetha Kuba, MD;  Location: [facility] OR;  Service: Neurosurgery;  Laterality: N/A;  3C   CARDIAC CATHETERIZATION  09/06/2013   [facility]   CARDIAC CATHETERIZATION     COLONOSCOPY WITH PROPOFOL  N/A 01/21/2019   Procedure: COLONOSCOPY WITH  PROPOFOL ;  Surgeon: Jinny Carmine, MD;  Location: [facility];  Service: Endoscopy;  Laterality: N/A;  sleep apnea   COLONOSCOPY WITH PROPOFOL  N/A 10/19/2021   Procedure: COLONOSCOPY WITH PROPOFOL ;  Surgeon: Jinny Carmine, MD;  Location: [facility] ENDOSCOPY;  Service: Endoscopy;  Laterality: N/A;   CORONARY ANGIOPLASTY  2004   STENT PLACEMENT   CYSTOSCOPY WITH RETROGRADE PYELOGRAM, URETEROSCOPY AND STENT PLACEMENT Left 03/19/2014   Procedure: CYSTOSCOPY WITH RETROGRADE PYELOGRAM, URETEROSCOPY AND STENT PLACEMENT;  Surgeon: Mickey Ricardo KATHEE Alvaro, MD;  Location: [facility] ORS;  Service: Urology;  Laterality: Left;   CYSTOSCOPY WITH RETROGRADE PYELOGRAM, URETEROSCOPY AND STENT PLACEMENT Bilateral 05/20/2015   Procedure:  CYSTOSCOPY WITH BILATERAL RETROGRADE PYELOGRAM,RIGHT  DIAGNOSTIC URETEROSCOPY ,LEFT URETEROSCOPY WITH HOLMIUM LASER  AND BILATERAL  STENT PLACEMENT ;  Surgeon: Ricardo Alvaro, MD;  Location: [facility] ORS;  Service: Urology;  Laterality: Bilateral;   DRUG INDUCED ENDOSCOPY N/A 10/20/2017   Procedure: DRUG INDUCED SLEEP ENDOSCOPY;  Surgeon: Carlie Clark, MD;  Location: [facility];  Service: ENT;  Laterality: N/A;   EP IMPLANTABLE DEVICE N/A 05/13/2016   Procedure: Loop Recorder Insertion;  Surgeon: Elspeth JAYSON Sage, MD;  Location: [facility] INVASIVE CV LAB;  Service: Cardiovascular;  Laterality: N/A;   ESOPHAGOGASTRODUODENOSCOPY N/A 10/19/2021  Procedure: ESOPHAGOGASTRODUODENOSCOPY (EGD);  Surgeon: Jinny Carmine, MD;  Location: [facility] ENDOSCOPY;  Service: Endoscopy;  Laterality: N/A;   ESOPHAGOGASTRODUODENOSCOPY (EGD) WITH PROPOFOL  N/A 11/30/2018   Procedure: ESOPHAGOGASTRODUODENOSCOPY (EGD) WITH PROPOFOL ;  Surgeon: Therisa Bi, MD;  Location: [facility] ENDOSCOPY;  Service: Gastroenterology;  Laterality: N/A;   ESOPHAGOGASTRODUODENOSCOPY (EGD) WITH PROPOFOL  N/A 12/01/2018   Procedure: ESOPHAGOGASTRODUODENOSCOPY (EGD) WITH PROPOFOL ;  Surgeon: Jinny Carmine, MD;  Location: [facility] ENDOSCOPY;  Service: Endoscopy;   Laterality: N/A;   HOLMIUM LASER APPLICATION Bilateral 05/20/2015   Procedure: HOLMIUM LASER APPLICATION;  Surgeon: Ricardo Likens, MD;  Location: [facility] ORS;  Service: Urology;  Laterality: Bilateral;   IMPLIMENTATION OF A HYPOGLOSSAL NERVE STIMULATOR  12/08/2017   OSA    IR GASTROSTOMY TUBE MOD SED  01/18/2024   IR RADIOLOGIST EVAL & MGMT  02/13/2024   LEFT HEART CATHETERIZATION WITH CORONARY ANGIOGRAM N/A 09/06/2013   Procedure: LEFT HEART CATHETERIZATION WITH CORONARY ANGIOGRAM;  Surgeon: Victory LELON Claudene DOUGLAS, MD;  Location: [facility] CATH LAB;  Service: Cardiovascular;  Laterality: N/A;   LEFT KNEE CAP  SURGERY ABOUT 40 YRS AGO  ? 1970     STONE EXTRACTION WITH BASKET Left 03/19/2014   Procedure: STONE EXTRACTION WITH BASKET;  Surgeon: Mickey Ricardo KATHEE Likens, MD;  Location: [facility]S;  Service: Urology;  Laterality: Left;   TRANSURETHRAL RESECTION OF PROSTATE N/A 08/25/2021   Procedure: TRANSURETHRAL RESECTION OF THE PROSTATE (TURP);  Surgeon: Likens Ricardo, MD;  Location: [facility] OR[facility];  Service: Urology;  Laterality: N/A;  1 HR   Patient Active Problem List   Diagnosis Date Noted   S/P gastrostomy tube (G tube) placement, follow-up exam 02/18/2024   Skin infection at gastrostomy tube site (HCC) 02/06/2024   Protein-calorie malnutrition, severe 01/23/2024   Multifocal pneumonia 01/22/2024   Fall 01/03/2024   Hematoma of right hip 01/03/2024   Chronic pulmonary aspiration 12/29/2023   Silent aspiration 12/29/2023   Moderate protein-calorie malnutrition (weight for age 67-74% of standard) 12/29/2023   Bilateral leg weakness 12/29/2023   Chronic respiratory failure with hypoxia, on home oxygen  therapy (HCC) 12/29/2023   Chronic constipation 12/29/2023   Somnolence 12/29/2023   Frequent falls 12/29/2023   Feeding difficulties, unspecified 12/20/2023   Shortness of breath 12/20/2023   Abnormal CT of the chest 10/10/2023   Elevated blood sugar 06/30/2023   Carotid stenosis 05/11/2023   Spinal stenosis in  cervical region 03/13/2023   Osteoarthritis of spine with radiculopathy, cervical region 07/14/2022   Cervicogenic headache 11/30/2021   Chronic neck pain 11/30/2021   Weight loss, non-intentional    Dysphagia    Polyp of colon    Prostatic hyperplasia 08/25/2021   Chronic insomnia 03/19/2020   Peripheral neuropathy 03/19/2020   Iron deficiency anemia 01/06/2020   Allergic dermatitis 07/08/2019   Osteoarthritis of left knee 02/26/2019   History of colonic polyps    History of duodenal ulcer    CKD (chronic kidney disease), stage IIIa    Bilateral chronic knee pain 11/09/2018   Epistaxis 10/02/2018   Subjective tinnitus, bilateral 10/02/2018   Chronic pain of right thumb 02/22/2018   Acute bilateral thoracic back pain 01/23/2018   Osteoporosis without current pathological fracture 02/10/2017   Hypothyroidism 11/02/2016   Thoracic compression fracture (HCC) 09/06/2016   PVC's (premature ventricular contractions) 07/07/2016   Hypoalbuminemia 06/28/2016   IgA nephropathy 06/07/2016   Acute buttock pain 05/23/2016   MDD (major depressive disorder), recurrent episode, moderate (HCC) 11/20/2015   OSA (obstructive sleep apnea) 02/05/2014   Orthostatic hypotension 02/07/2013  Scotoma involving central area 01/29/2013   Optic neuropathy 01/28/2013   HEMATURIA UNSPECIFIED 01/21/2010   HLD (hyperlipidemia) 07/05/2006   Generalized anxiety disorder 07/05/2006   CAD (coronary artery disease) 07/05/2006   GERD 07/05/2006    PCP: Dr. Greig Ring    REFERRING PROVIDER: Dr. Arley Helling   REFERRING DIAG: M54.6 (ICD-10-CM) - Acute bilateral thoracic back pain  THERAPY DIAG:  Oropharyngeal dysphagia  Pain in thoracic spine  Cervicalgia  Rationale for Evaluation and Treatment: Rehabilitation  ONSET DATE: May 2025    SUBJECTIVE:                                                                                                                                                                                                          SUBJECTIVE STATEMENT:  Pt reports doing well today and reports he thinks therapy has been going well. He reports still having higher intensity neck pain, being a 8/10 this morning.  PERTINENT HISTORY:  73yoM referred to physical therapy for ongoing neck pain following cervical fusion in May 2020 and newly developed thoracic pain from T12 compression fracture that occurred sometime in Spring of 2025 (associated radicular pain pattern). Pt has pain localized to upper traps and suboccipitals. Pt attempted DC of prednisone  and gabapentin  two weeks prior to evaluation here, severe decline in function due to pain, barely able to walk for two weeks. He describes feeling increased fatigue and weakness to the point where he struggled to walk. Pt also reported repeated falls in the setting of orthostatic hypotension with him experiencing syncope when he stands up from a seated position. He describes feeling a burning sensation that is also localized to his scapulae and that it occurs when he is doing UE activities like washing his car that is likely the radicular pain from thoracic vertebrae compression fracture.  PAIN: Are you having pain? 8/10 mostly into neck and slightly down into lower cervical/upper thoracic today  PRECAUTIONS: None Pt has thoracic compression fractures and is s/p cervical spine surgery   WEIGHT BEARING RESTRICTIONS: No  FALLS:  Has patient fallen in last 6 months? Yes. Number of falls 6, pt reports that falls are happening in the context of syncope when standing up from a seated position (Orthostatic Hypotension) , where he blacks out and falls. He is following up with his PCP, Dr. Karlene who is working with him on stabilizing his blood pressure and she has recently taken him off Gabapentin  to see if this helps. Pt reports that he does note an improvement in his symptoms with less  light headedness since going off medication.   LIVING  ENVIRONMENT: Lives with: lives with their spouse Lives in: House/apartment Stairs: No Has following equipment at home: Single point cane and Environmental Consultant - 4 wheeled  OCCUPATION: Retired     PLOF: Independent  PATIENT GOALS: Improve balance, decrease falls.       OBJECTIVE:   Note: Objective measures were completed at evaluation unless otherwise noted. Reassessment Measures 02/09/24: -eyes closed balance, normal stance: <5sec with retropulsion each time, ModA falls recovery  -narrow stance eyes open balance: <10sec with LOB, ModA falls recovery  -Long distance AMB assessment c SPC: 127ft with 2 DOE standing breaks ~15 seconds each, 15sec; crossover gait intermittently without LOB; -standing vitals taken at end of walk: stable and comparable to prior seated vitals.  -cervical rotation ROM: 50deg Rt, 53 degree left  -shoulder flexion MMT: 5/5 Rt, 4+/5 left; shoulder ABDCT MMT: 5/5 bilat  -ankle DF assessment: can perform >10x full range while leaning against wall (adequate for gait needs)  -sensory testing about ankles, lower legs: (feels intact, but pt reports constance N/T below ankle to his toes)  Standing BP assessment: 02/28/24 -124/82mmHg 95bpm standing @ 0 -122/14mmHg 90bpm standing @ 1 110/23mmHg 92bpm standing @ 2  TREATMENT DATE 03/01/24:   Physical Performance Measure: Progress note performed at today's session.  TE: x6 R/L, 10s hold UT stretch w/ overpressure 2x10 R/L head circles 2x12 bwd shoulder rolls  RTB seated row, 2x8  NECK DISABILITY INDEX  Date: 03/01/2024  Score  Pain intensity 3 = The pain is fairly severe at the moment  2. Personal care (washing, dressing, etc.) 1 =  I can look after myself normally but it causes extra pain  3. Lifting 2 = Pain prevents me lifting heavy weights off the floor, but I can manage if they are  conveniently placed, for example on a table  4. Reading 3 = I can't read as much as I want because of moderate pain in my neck   5. Headaches 1 =  I have slight headaches, which come infrequently  6. Concentration 2 = I have a fair degree of difficulty in concentrating when I want to  7. Work 2 = I can do most of my usual work, but no more  8. Driving 4 =  I can hardly drive at all because of severe pain in my neck  9. Sleeping 3 =  My sleep is moderately disturbed (2-3 hrs sleepless)  10. Recreation 4 =  I can hardly do any recreation activities because of pain in my neck  Total 25/50   Minimum Detectable Change (90% confidence): 5 points or 10% points   PATIENT EDUCATION:  Education details: Pt progressing so far with cardio and balance activity.     Person educated: Patient Education method: Medical Illustrator Education comprehension: verbalized understanding, returned demonstration, and verbal cues required  HOME EXERCISE PROGRAM:  Need to create new HEP for balance once pt is ready. (Formally DC neck HEP on 02/09/24)   ASSESSMENT:  CLINICAL IMPRESSION:  Pt arrives to session with good motivation for activity. Progress note and goal update completed at today's session. Pt NDI score shows decrease since last update, with pt scoring a 50%, indicating significant ADL disability. Pt gross neck ROM shows overall decrease since last progress note, most notably in the sidebending direction. Pt decrease in ROM is most likely due to pt reported pain and an earlier appt time where pt reports increased stiffness in  the mornings. Pt reported neck pain shows minor decrease on NPRS, only by 1 point, going from a 9-10/10 to an 8/10. Pt MMT of B shld flex/abd shows great improvement of periscapular strength. Remainder of appointment spent doing light cervical stretching for decrease of pain and increased ROM. Pt reports shoulder rolls and head circles felt good and reported decrease in pain following exercise. Tactile/verbal cue for shoulder depression given by PT during RTB row activity, and pt showed subsequent  self-correction. Patient will benefit from cardiorespiratory, endurance and balance training in addition to cervical strengthening and cervical pain reduction activities. Pt will continue to benefit from skilled therapy to address remaining deficits in order to improve overall QoL and return to PLOF.    OBJECTIVE IMPAIRMENTS: decreased balance, decreased ROM, decreased strength, hypomobility, impaired flexibility, impaired UE functional use, and pain.   ACTIVITY LIMITATIONS: carrying, lifting, standing, bathing, dressing, reach over head, and hygiene/grooming PARTICIPATION LIMITATIONS: cleaning, laundry, shopping, community activity, and yard work PERSONAL FACTORS: Age and 3+ comorbidities: CAD, Depression, are also affecting patient's functional outcome.  REHAB POTENTIAL: Good CLINICAL DECISION MAKING: Stable/uncomplicated EVALUATION COMPLEXITY: Low   GOALS: Goals reviewed with patient? No  SHORT TERM GOALS: Target date: 01/03/2024  Patient will demonstrate undestanding of home exercise plan by performing exercises correctly with evidence of good carry over with min to no verbal or tactile cues .   Baseline: NT 12/25/23: Performing exercises independently  Goal status: ACHIEVED   2.  Patient will demonstrate understanding of how to modify exercises to decrease thoracic and cervical pain exacerbation for improved function and to decrease incidence of pain flare ups.  Baseline: Does not currently have strategy to manage this  Goal status: NOT MET      LONG TERM GOALS: Target date: 03/13/2024  Patient will show a statistically significant improvement in his neck function as evidence by >=7.5 pt decrease in her neck disability index score to better be able to move neck to improve visual field while driving to avoid accidents. Vernel et al, 2009) Baseline: 33/50 (66%); 02/09/24: 66% 03/01/2024: 50% Goal status: NOT MET     2.  Patient will increase cervical mobility by  >=5 degrees AROM for  all planes of motion (flexion, extension, side bending, and rotation) for improved cervical function and evidence of pain reduction. Baseline: Cervical AROM Flex 30, Ext 30, Rot R/L 40/40, SB R/L 35/35; 02/09/24: Rt rotation 50 degrees, Left rotation: 53 degress  03/01/2024: Flex:  30*   Ext: 20*      Rot L: 32*     Rot R: 30*      SB L: 20*       SB R: 15* Goal status: MET    3.  Patient will improve shoulder and periscapular strength by 1/3 grade MMT (4- to 4) for improved cervical stability and UE function to carry out upper extremity activities like washing his car and gardening without being limited by pain and discomfort.  Baseline: Shoulder Flex R/L 4/4, Shoulder Abd R/L 4/4; 10/31: shoulder flexion Rt: 5/5, left 4+/5; shoulder ABDCT 5/5 bilat  03/01/2024: Shld Flex R: 5 Shld Flex L: 4+ , Shld abd R: 4 +  Shld Abd L: 4 Goal status: MET    4.  Patient will reduce neck and thoracic pain to <=4/10 NRPS while performing UE activities like washing his car and doing yard work as evidence of improved cervical and thoracic function.  Baseline: 10/10 cervical paraspinal and rhomboid pain with UE activity; up  to a 9/10 intermittently with activity   03/01/2024: 8/10 NPS UE activity Goal status: NOT MET    PLAN:  PT FREQUENCY: 1-2x/week PT DURATION: 12 weeks PLANNED INTERVENTIONS: 97164- PT Re-evaluation, 97750- Physical Performance Testing, 97110-Therapeutic exercises, 97530- Therapeutic activity, 97112- Neuromuscular re-education, 97535- Self Care, 02859- Manual therapy, [id number]- Gait training, [phone]- Canalith repositioning, [id number]- Aquatic Therapy, [phone]- Electrical stimulation (unattended), [phone]- Electrical stimulation (manual), [id number]- Traction (mechanical), 20560 (1-2 muscles), 20561 (3+ muscles)- Dry Needling, Patient/Family education, Balance training, Joint mobilization, Joint manipulation, Spinal manipulation, Spinal mobilization, Vestibular training, DME instructions, Cryotherapy, and Moist  heat  PLAN FOR NEXT SESSION:   -Update of goals to to mirror direction of therapy sessions for weakness, deconditioning, and imbalance -continue to address cervical ROM/pain -address global weakness, deconditioning, and imbalance.   Renna Helling, SPT 8:07 AM, 03/01/24

## 2024-03-01 NOTE — Procedures (Signed)
 Modified Barium Swallow Study  Patient Details  Name: Shawn Lam MRN: [id number] Date of Birth: 08/28/1950  Today's Date: 03/01/2024  Modified Barium Swallow completed.  Full report located under Chart Review in the Imaging Section.  History of Present Illness Pt is a 73 year old male with dysphagia following ACDF (03/2023). MBSS 12/26/2023 revealed moderate to severe pharyngeal phase dysphagia with cervical esophageal changes in/around the CP segment and cervical esophagus. Pt with intermittent silent aspiration of thin liquids. ENT evaluation with laryngoscopy on 01/12/2024 revealed moderate vocal fold bowing bilaterally with moderate glottic gap with phonation. Referred to Dr Brien at [facility] for potential bilateral injection laryngoplasty (appt made for January 2026). Pt with continued unintentional weight loss and PEG placement on 01/19/2024. Has participated in outpatient ST services targeting pharyngeal strengthening and RMT. Pt with improving chest imaging.   Clinical Impression During this study, pt continues to present with moderate to severe pharyngeal phase dysphagia as well as reduced amplitude and duration of PE segment opening This results in silent aspiration of thin liquids and nectar thick liquids.     When consuming thin liquids and nectar thick liquids, pt's pharyngeal phase is c/b reduced anterior movement of hyoid, epiglottic inversion and glottal closure. Significant pharyngeal residue remained within the pyriform sinuses d/t minimal transit thru PE segment. Despite multiple cued swallows and use of compensatory swallow strategies, pt largely not able to clear residue. Aspiration occurred during initial swallow as well as subsequent efforts to clear residue.     Pt is at a high risk of aspiration when consuming larger amounts of PO diet. Recommend he continue nutrition and hydration thru PEG with follow up Outpatient ST and [facility]ol[facility] at [facility] (January  2026). Factors that may increase risk of adverse event in presence of aspiration Noe & Lianne 2021): Respiratory or GI disease;Reduced cognitive function;Limited mobility;Frail or deconditioned  Swallow Evaluation Recommendations Recommendations: NPO except meds;Free water  protocol after oral care;Alternative means of nutrition - G Tube Medication Administration: Via alternative means Supervision: Full supervision/cueing for swallowing strategies Swallowing strategies  : Minimize environmental distractions;Slow rate;Small bites/sips Postural changes: Position pt fully upright for meals;Stay upright 30-60 min after meals Oral care recommendations: Oral care QID (4x/day) Caregiver Recommendations: Avoid jello, ice cream, thin soups, popsicles;Remove water  pitcher     [name] B. Rubbie, M.S., CCC-SLP, Tree Surgeon Certified Brain Injury Specialist [facility]  [facility] Rehabilitation Services Office [phone] Ascom [phone] Fax [phone]

## 2024-03-01 NOTE — Therapy (Signed)
 OUTPATIENT SPEECH LANGUAGE PATHOLOGY  SWALLOW TREATMENT     Patient Name: Shawn Lam MRN: [id number] DOB:11-29-1950, 73 y.o., male Today's Date: 03/01/2024  PCP: Greig Ring, MD REFERRING PROVIDER: Greig Ring, MD    End of Session - 03/01/24 1441     Visit Number 11    Number of Visits 25    Date for Recertification  03/25/24    Authorization Type Medicare Part A    Progress Note Due on Visit 20    SLP Start Time 0900    SLP Stop Time  0917    SLP Time Calculation (min) 17 min    Activity Tolerance Patient tolerated treatment well          Past Medical History:  Diagnosis Date   Actinic keratosis 02/04/2021   right lateral neck   Anemia    Anxiety    Asymptomatic LV dysfunction    50-55% by echo 06/2016   BCC (basal cell carcinoma of skin) 08/05/2020   left neck infra auricular - nodular pattern - treated with [facility] 09/17/2020   Berger's disease    CAD (coronary artery disease) 06/2005   PCI of LAD and RCA   Depression    Dilated aortic root    4cm at sinus of Valsalva   Hematuria    Hyperlipidemia    Hypothyroidism    Insomnia    Left ureteral stone    Orthostatic hypotension    negative tilt table   OSA (obstructive sleep apnea)    moderate with AHI 17/hr, uses CPAP nightly(01/15/19 - has Inspire implant)   PVC's (premature ventricular contractions) 07/07/2016   Renal disorder    PT IS SEEING DR. BETSEY- NEPHROLOGIST   Rosacea    Urticaria    Past Surgical History:  Procedure Laterality Date   ANTERIOR CERVICAL DECOMP/DISCECTOMY FUSION N/A 03/13/2023   Procedure: CERVICAL FOUR-FIVE, CERVICAL FIVE-SIX ANTERIOR CERVICAL DISCECTOMY/DECOMPRESSION FUSION;  Surgeon: Onetha Kuba, MD;  Location: [facility] OR;  Service: Neurosurgery;  Laterality: N/A;  3C   CARDIAC CATHETERIZATION  09/06/2013   [facility]   CARDIAC CATHETERIZATION     COLONOSCOPY WITH PROPOFOL  N/A 01/21/2019   Procedure: COLONOSCOPY WITH PROPOFOL ;  Surgeon: Jinny Carmine, MD;  Location: [facility];  Service: Endoscopy;  Laterality: N/A;  sleep apnea   COLONOSCOPY WITH PROPOFOL  N/A 10/19/2021   Procedure: COLONOSCOPY WITH PROPOFOL ;  Surgeon: Jinny Carmine, MD;  Location: [facility] ENDOSCOPY;  Service: Endoscopy;  Laterality: N/A;   CORONARY ANGIOPLASTY  2004   STENT PLACEMENT   CYSTOSCOPY WITH RETROGRADE PYELOGRAM, URETEROSCOPY AND STENT PLACEMENT Left 03/19/2014   Procedure: CYSTOSCOPY WITH RETROGRADE PYELOGRAM, URETEROSCOPY AND STENT PLACEMENT;  Surgeon: Mickey Ricardo KATHEE Alvaro, MD;  Location: [facility] ORS;  Service: Urology;  Laterality: Left;   CYSTOSCOPY WITH RETROGRADE PYELOGRAM, URETEROSCOPY AND STENT PLACEMENT Bilateral 05/20/2015   Procedure:  CYSTOSCOPY WITH BILATERAL RETROGRADE PYELOGRAM,RIGHT  DIAGNOSTIC URETEROSCOPY ,LEFT URETEROSCOPY WITH HOLMIUM LASER  AND BILATERAL  STENT PLACEMENT ;  Surgeon: Ricardo Alvaro, MD;  Location: [facility];  Service: Urology;  Laterality: Bilateral;   DRUG INDUCED ENDOSCOPY N/A 10/20/2017   Procedure: DRUG INDUCED SLEEP ENDOSCOPY;  Surgeon: Carlie Clark, MD;  Location: [facility];  Service: ENT;  Laterality: N/A;   EP IMPLANTABLE DEVICE N/A 05/13/2016   Procedure: Loop Recorder Insertion;  Surgeon: Elspeth JAYSON Sage, MD;  Location: [facility];  Service: Cardiovascular;  Laterality: N/A;   ESOPHAGOGASTRODUODENOSCOPY N/A 10/19/2021   Procedure: ESOPHAGOGASTRODUODENOSCOPY (EGD);  Surgeon: Jinny Carmine, MD;  Location: [facility] ENDOSCOPY;  Service: Endoscopy;  Laterality: N/A;   ESOPHAGOGASTRODUODENOSCOPY (EGD) WITH PROPOFOL  N/A 11/30/2018   Procedure: ESOPHAGOGASTRODUODENOSCOPY (EGD) WITH PROPOFOL ;  Surgeon: Therisa Bi, MD;  Location: [facility] ENDOSCOPY;  Service: Gastroenterology;  Laterality: N/A;   ESOPHAGOGASTRODUODENOSCOPY (EGD) WITH PROPOFOL  N/A 12/01/2018   Procedure: ESOPHAGOGASTRODUODENOSCOPY (EGD) WITH PROPOFOL ;  Surgeon: Jinny Carmine, MD;  Location: [facility] ENDOSCOPY;  Service: Endoscopy;  Laterality: N/A;   HOLMIUM LASER APPLICATION Bilateral 05/20/2015    Procedure: HOLMIUM LASER APPLICATION;  Surgeon: Ricardo Likens, MD;  Location: [facility];  Service: Urology;  Laterality: Bilateral;   IMPLIMENTATION OF A HYPOGLOSSAL NERVE STIMULATOR  12/08/2017   OSA    IR GASTROSTOMY TUBE MOD SED  01/18/2024   IR RADIOLOGIST EVAL & MGMT  02/13/2024   LEFT HEART CATHETERIZATION WITH CORONARY ANGIOGRAM N/A 09/06/2013   Procedure: LEFT HEART CATHETERIZATION WITH CORONARY ANGIOGRAM;  Surgeon: Victory LELON Claudene DOUGLAS, MD;  Location: [facility] CATH LAB;  Service: Cardiovascular;  Laterality: N/A;   LEFT KNEE CAP  SURGERY ABOUT 40 YRS AGO  ? 1970     STONE EXTRACTION WITH BASKET Left 03/19/2014   Procedure: STONE EXTRACTION WITH BASKET;  Surgeon: Mickey Ricardo KATHEE Likens, MD;  Location: [facility];  Service: Urology;  Laterality: Left;   TRANSURETHRAL RESECTION OF PROSTATE N/A 08/25/2021   Procedure: TRANSURETHRAL RESECTION OF THE PROSTATE (TURP);  Surgeon: Likens Ricardo, MD;  Location: [facility];  Service: Urology;  Laterality: N/A;  1 HR   Patient Active Problem List   Diagnosis Date Noted   S/P gastrostomy tube (G tube) placement, follow-up exam 02/18/2024   Skin infection at gastrostomy tube site (HCC) 02/06/2024   Protein-calorie malnutrition, severe 01/23/2024   Multifocal pneumonia 01/22/2024   Fall 01/03/2024   Hematoma of right hip 01/03/2024   Chronic pulmonary aspiration 12/29/2023   Silent aspiration 12/29/2023   Moderate protein-calorie malnutrition (weight for age 1-74% of standard) 12/29/2023   Bilateral leg weakness 12/29/2023   Chronic respiratory failure with hypoxia, on home oxygen  therapy (HCC) 12/29/2023   Chronic constipation 12/29/2023   Somnolence 12/29/2023   Frequent falls 12/29/2023   Feeding difficulties, unspecified 12/20/2023   Shortness of breath 12/20/2023   Abnormal CT of the chest 10/10/2023   Elevated blood sugar 06/30/2023   Carotid stenosis 05/11/2023   Spinal stenosis in cervical region 03/13/2023   Osteoarthritis of spine with  radiculopathy, cervical region 07/14/2022   Cervicogenic headache 11/30/2021   Chronic neck pain 11/30/2021   Weight loss, non-intentional    Dysphagia    Polyp of colon    Prostatic hyperplasia 08/25/2021   Chronic insomnia 03/19/2020   Peripheral neuropathy 03/19/2020   Iron deficiency anemia 01/06/2020   Allergic dermatitis 07/08/2019   Osteoarthritis of left knee 02/26/2019   History of colonic polyps    History of duodenal ulcer    CKD (chronic kidney disease), stage IIIa    Bilateral chronic knee pain 11/09/2018   Epistaxis 10/02/2018   Subjective tinnitus, bilateral 10/02/2018   Chronic pain of right thumb 02/22/2018   Acute bilateral thoracic back pain 01/23/2018   Osteoporosis without current pathological fracture 02/10/2017   Hypothyroidism 11/02/2016   Thoracic compression fracture (HCC) 09/06/2016   PVC's (premature ventricular contractions) 07/07/2016   Hypoalbuminemia 06/28/2016   IgA nephropathy 06/07/2016   Acute buttock pain 05/23/2016   MDD (major depressive disorder), recurrent episode, moderate (HCC) 11/20/2015   OSA (obstructive sleep apnea) 02/05/2014   Orthostatic hypotension 02/07/2013   Scotoma involving central area 01/29/2013   Optic  neuropathy 01/28/2013   HEMATURIA UNSPECIFIED 01/21/2010   HLD (hyperlipidemia) 07/05/2006   Generalized anxiety disorder 07/05/2006   CAD (coronary artery disease) 07/05/2006   GERD 07/05/2006    ONSET DATE: 03/2023 following ACDF; Date of referral 12/28/2023: date of this referral 02/01/2024  REFERRING DIAG:  T17.908D (ICD-10-CM) - Chronic pulmonary aspiration, subsequent encounter  T17.900D (ICD-10-CM) - Silent aspiration, subsequent encounter  E44.0 (ICD-10-CM) - Moderate protein-calorie malnutrition  R13.10 (ICD-10-CM) - Dysphagia, unspecified type    THERAPY DIAG:  Dysphagia, oropharyngeal phase  Rationale for Evaluation and Treatment Rehabilitation  SUBJECTIVE:   PERTINENT HISTORY and DIAGNOSTIC  FINDINGS:  Pt is a 73 yo male w/ ACDF surgery 03/2023.  PMH: GERD(Not on a PPI per chart), Actinic keratosis (02/04/2021), Anemia, Anxiety, Asymptomatic LV dysfunction, BCC (basal cell carcinoma of skin) (08/05/2020), Berger's disease, CAD (coronary artery disease) (06/2005), resting tremor, Depression, Dilated aortic root (HCC), Hematuria, Hyperlipidemia, Hypothyroidism, Insomnia, Left ureteral stone, Orthostatic hypotension, OSA (obstructive sleep apnea), PVC's (premature ventricular contractions) (07/07/2016), Renal disorder, Rosacea, and Urticaria.  Pt is followed by Cardiology for Coronary Artery Stenosis currently; also followed for Migraines.     EGD in 2023 revealed Gastritis.      CT of Chest 10/2023: 1. Interim development of patchy consolidation and ground-glass   disease most evident at the right middle lobe and right lower lobes   with additional areas of mild ground-glass disease and scattered   clustered nodularity, findings are suspicious for multifocal   infection/pneumonia. Imaging follow-up to resolution is recommended.   2. Emphysema.   3. Aortic atherosclerosis.            Dysphagia - reported by pt as Chronic, ever since neck surgery in December 2024. Pt was seen in the emergency room s/p Fall, notes detailing that he slipped on soap in the shower, fell backwards on right side hit head and neck against shower seat, possible brief loss of consciousness, per chart.  ACDF 03/2023 per chart.  Since the neck surgery in 03/2023, he has noted food, liquids and pill getting stuck on the right side of my throat more.  I used to cough a lot more but not as much now.  I cough up what gets stuck..  He has lost significant weight per chart notes due to decreased eating.  Pt endorsed using his home O2 when he feels SOB.   MBSS 12/26/2023 Patient appears to present w/ Chronic, moderate-severe Pharyngeal phase dysphagia per this assessment today. It appeared there was some impact from changes in  the Cervical Esophagus s/p ACDF surgery(03/2023 per chart notes) including structural/tissue changes in/aournd the CP segment and cervical esophagus. Pt with intermittent silent aspiration of thin liquids.   Recommend continue current diet of easy to eat foods at home; thin liquids via cup- at this time. Oral care Before any oral intake. Swallow Strategies Recommended: No straws, moistened foods, SMALL bites/sips, Head Turn RIGHT w/ slight chin tuck, multiple swallows to aid clearing, AND strong Cough/throat clear b/t bites/sips and at end of meal.  D/t the Chronicity of pt's Pharyngeal phase Dysphagia w/ weight loss and pulmonary impact per recent Chest CT, pt and Family may need to discuss alternative means of nutrition/hydration as support w/ his Medical Team/PCP, while seeking swallow therapy.  Received ENT report dated 01/12/2024 Laryngoscopy revealed  Moderate vocal fold bowing bilaterally and moderate glottic gamp with phonation Impression/plan: Aspiration on MBSS and significant weight loss since ACDF last year. TVF shows moderate sized glottic gap on laryngoscoy but  mobile vocal folds bilaterally. Pooling in pyriform on MBSS but no significant pooling of secretions on laryngoscopy. I feel that between speech therpay and possibly bilateral injection laryngoplasty, hopefully his swallowing will significantly improve. Will make referral to Dr Brien at [facility].   Pt and his wife report appt with Dr Brien in January 2026.  10/10/205 PEG placed  01/22/2024 thru 01/24/2024 Hospitalized d/t fall and confusion CT of the chest on arrival showed multifocal pneumonia and possible free air. When reviewed further with interventional radiology team it was determined this was an appropriate amount of free air secondary to recent PEG tube placement. Patient was initiated on antibiotic therapy for pneumonia and improved significantly.   02/07/2024 CT Chest 1. Evolving multifocal inflammatory  infiltrates, progressive within the right upper lobe and improved within the lung bases bilaterally. Recommend appropriate clinical management and imaging follow-up per clinical course. 2. Mild emphysema. Given emphysema as an independent lung cancer risk factor, consider evaluation for a low-dose CT lung cancer screening program if the patient is 73 years old and otherwise eligible.   PAIN:  Are you having pain? No  FALLS: Has patient fallen in last 6 months?  See PT evaluation for details  LIVING ENVIRONMENT: Lives with: lives with their spouse Lives in: House/apartment  PLOF:  Level of assistance: Independent with ADLs, Independent with IADLs Employment: Retired   PATIENT GOALS  to improve swallow function, nutrition and hydration, decrease aspiration risks  SUBJECTIVE STATEMENT: Pt eager, pleasant Pt accompanied by: self   OBJECTIVE:   TODAY'S TREATMENT:  Skilled treatment session focused on pt's dysphagia goals. SLP facilitated the session by providing the following interventions:   Skilled observation was provided of pt consuming thin liquids via cup sips and applesauce. Pt instructed to consume without use of compensatory swallow strategies for assessment of potential overt s/s of aspiration. Pt with 2-3 swallow per bolus of both textures. He stated it feels like they won't go down. Pt with no wet vocal quality or coughing during consumption.   Radiology called and informed this writer that pt's Modified Barium Swallow Study could be moved from 1300 today to current time.   PATIENT EDUCATION: Education details: ST POC, repeat MBSS in 8 to 12 weeks Person educated: Patient and Spouse Education method: Chief Technology Officer Education comprehension: needs further education  HOME EXERCISE PROGRAM:     See above   GOALS: Goals reviewed with patient? Yes  SHORT TERM GOALS: Target date: 10 sessions  Updated 02/28/2024  Updated 02/09/2024 With Mod I, pt  will perform compensatory swallow strategies (chin tuck, multiple swallows, head turn) to eliminate s/s of aspiration when consuming least restrictive diet. Baseline: Goal status: INITIAL: moderate A: Progress made, fluctuating between supervision and independent cues  2.  With Min A, pt will complete (supraglottic, swallow, Mednelson maneuver, effortful swallow) to improve oral motor weakness, tongue base retraction, hyolarnygeal excursion, airway protection and/or clearance of the bolus through the pharynx. Baseline:  Goal status: INITIAL: progress made; moderate A: Progress made, fluctuating between supervision and independent cues  3. Pt will demonstrate overt s/s of aspiration when consuming ice chips.   Baseline: Goal status: INITIAL: no overt s/s of aspiration observed during today's session: Intermittent overt s/s observed   LONG TERM GOALS: Target date: 03/25/2024  Updated 02/28/2024  Updated: 02/09/2024 With Mod I, pt will demonstrate the ability to adequately self-monitor swallowing skills and perform appropriate compensatory techniques to reduce s/s of aspiration and to safely consume least restrictive diet.  Baseline:  Goal status: INITIAL: continues to be appropriate goal  2.  Pt will participate in repeat instrumental swallow evaluation to assess least restrictive diet.  Baseline:  Goal status: INITIAL: continues to be appropriate goal: Modified Barium scheduled for 11/21   ASSESSMENT:  CLINICAL IMPRESSION: Patient is a 73 y.o. male who was seen today for a clinical swallow re-evaluation d/t severe pharyngeal phase dysphagia following ACDF (03/2023).    See the above treatment note for details.   OBJECTIVE IMPAIRMENTS include dysphagia. These impairments are limiting patient from safety when swallowing. Factors affecting potential to achieve goals and functional outcome are severity of impairments and malnutrition. Patient will benefit from skilled SLP services to  address above impairments and improve overall function.  REHAB POTENTIAL: Good  PLAN: SLP FREQUENCY: 1-2x/week  SLP DURATION: 12 weeks  PLANNED INTERVENTIONS: Aspiration precaution training, Pharyngeal strengthening exercises, Diet toleration management , Oral motor exercises, SLP instruction and feedback, Compensatory strategies, and Patient/family education   Janashia Parco B. Rubbie, M.S., CCC-SLP, Tree Surgeon Certified Brain Injury Specialist [facility]  [facility] Office [phone] Ascom [phone] Fax [phone]

## 2024-03-03 ENCOUNTER — Ambulatory Visit: Payer: Self-pay | Admitting: Cardiovascular Disease

## 2024-03-05 ENCOUNTER — Other Ambulatory Visit (INDEPENDENT_AMBULATORY_CARE_PROVIDER_SITE_OTHER)

## 2024-03-05 ENCOUNTER — Ambulatory Visit: Admitting: Speech Pathology

## 2024-03-05 ENCOUNTER — Ambulatory Visit

## 2024-03-05 ENCOUNTER — Ambulatory Visit: Admitting: Physical Therapy

## 2024-03-05 ENCOUNTER — Encounter: Payer: Self-pay | Admitting: Physical Therapy

## 2024-03-05 ENCOUNTER — Encounter

## 2024-03-05 DIAGNOSIS — M546 Pain in thoracic spine: Secondary | ICD-10-CM | POA: Diagnosis not present

## 2024-03-05 DIAGNOSIS — M6281 Muscle weakness (generalized): Secondary | ICD-10-CM | POA: Diagnosis not present

## 2024-03-05 DIAGNOSIS — R1312 Dysphagia, oropharyngeal phase: Secondary | ICD-10-CM

## 2024-03-05 DIAGNOSIS — R269 Unspecified abnormalities of gait and mobility: Secondary | ICD-10-CM

## 2024-03-05 DIAGNOSIS — M542 Cervicalgia: Secondary | ICD-10-CM | POA: Diagnosis not present

## 2024-03-05 DIAGNOSIS — R7401 Elevation of levels of liver transaminase levels: Secondary | ICD-10-CM

## 2024-03-05 DIAGNOSIS — R2689 Other abnormalities of gait and mobility: Secondary | ICD-10-CM | POA: Diagnosis not present

## 2024-03-05 LAB — HEPATIC FUNCTION PANEL
ALT: 85 U/L — ABNORMAL HIGH (ref 0–53)
AST: 76 U/L — ABNORMAL HIGH (ref 0–37)
Albumin: 3.9 g/dL (ref 3.5–5.2)
Alkaline Phosphatase: 79 U/L (ref 39–117)
Bilirubin, Direct: 0.1 mg/dL (ref 0.0–0.3)
Total Bilirubin: 0.7 mg/dL (ref 0.2–1.2)
Total Protein: 6.2 g/dL (ref 6.0–8.3)

## 2024-03-05 LAB — PHOSPHORUS: Phosphorus: 4.7 mg/dL — ABNORMAL HIGH (ref 2.3–4.6)

## 2024-03-05 NOTE — Therapy (Signed)
 OUTPATIENT SPEECH LANGUAGE PATHOLOGY  SWALLOW TREATMENT     Patient Name: Shawn Lam MRN: [id number] DOB:[birth year], 73 y.o., male Today's Date: 03/05/2024  PCP: Greig Ring, MD REFERRING PROVIDER: Greig Ring, MD    End of Session - 03/05/24 0839     Visit Number 12    Number of Visits 25    Date for Recertification  03/25/24    Authorization Type Medicare Part A    Progress Note Due on Visit 20    SLP Start Time 0845    SLP Stop Time  0930    SLP Time Calculation (min) 45 min    Activity Tolerance Patient tolerated treatment well          Past Medical History:  Diagnosis Date   Actinic keratosis 02/04/2021   right lateral neck   Anemia    Anxiety    Asymptomatic LV dysfunction    50-55% by echo 06/2016   BCC (basal cell carcinoma of skin) 08/05/2020   left neck infra auricular - nodular pattern - treated with [facility] 09/17/2020   Berger's disease    CAD (coronary artery disease) 06/2005   PCI of LAD and RCA   Depression    Dilated aortic root    4cm at sinus of Valsalva   Hematuria    Hyperlipidemia    Hypothyroidism    Insomnia    Left ureteral stone    Orthostatic hypotension    negative tilt table   OSA (obstructive sleep apnea)    moderate with AHI 17/hr, uses CPAP nightly(01/15/19 - has Inspire implant)   PVC's (premature ventricular contractions) 07/07/2016   Renal disorder    PT IS SEEING DR. BETSEY- NEPHROLOGIST   Rosacea    Urticaria    Past Surgical History:  Procedure Laterality Date   ANTERIOR CERVICAL DECOMP/DISCECTOMY FUSION N/A 03/13/2023   Procedure: CERVICAL FOUR-FIVE, CERVICAL FIVE-SIX ANTERIOR CERVICAL DISCECTOMY/DECOMPRESSION FUSION;  Surgeon: Onetha Kuba, MD;  Location: [facility] OR;  Service: Neurosurgery;  Laterality: N/A;  3C   CARDIAC CATHETERIZATION  09/06/2013   [facility]   CARDIAC CATHETERIZATION     COLONOSCOPY WITH PROPOFOL  N/A 01/21/2019   Procedure: COLONOSCOPY WITH PROPOFOL ;  Surgeon: Jinny Carmine, MD;  Location: [facility];  Service: Endoscopy;  Laterality: N/A;  sleep apnea   COLONOSCOPY WITH PROPOFOL  N/A 10/19/2021   Procedure: COLONOSCOPY WITH PROPOFOL ;  Surgeon: Jinny Carmine, MD;  Location: [facility] ENDOSCOPY;  Service: Endoscopy;  Laterality: N/A;   CORONARY ANGIOPLASTY  2004   STENT PLACEMENT   CYSTOSCOPY WITH RETROGRADE PYELOGRAM, URETEROSCOPY AND STENT PLACEMENT Left 03/19/2014   Procedure: CYSTOSCOPY WITH RETROGRADE PYELOGRAM, URETEROSCOPY AND STENT PLACEMENT;  Surgeon: Mickey Ricardo KATHEE Alvaro, MD;  Location: [facility] ORS;  Service: Urology;  Laterality: Left;   CYSTOSCOPY WITH RETROGRADE PYELOGRAM, URETEROSCOPY AND STENT PLACEMENT Bilateral 05/20/2015   Procedure:  CYSTOSCOPY WITH BILATERAL RETROGRADE PYELOGRAM,RIGHT  DIAGNOSTIC URETEROSCOPY ,LEFT URETEROSCOPY WITH HOLMIUM LASER  AND BILATERAL  STENT PLACEMENT ;  Surgeon: Ricardo Alvaro, MD;  Location: [facility];  Service: Urology;  Laterality: Bilateral;   DRUG INDUCED ENDOSCOPY N/A 10/20/2017   Procedure: DRUG INDUCED SLEEP ENDOSCOPY;  Surgeon: Carlie Clark, MD;  Location: [facility];  Service: ENT;  Laterality: N/A;   EP IMPLANTABLE DEVICE N/A 05/13/2016   Procedure: Loop Recorder Insertion;  Surgeon: Elspeth JAYSON Sage, MD;  Location: [facility] INVASIVE CV LAB;  Service: Cardiovascular;  Laterality: N/A;   ESOPHAGOGASTRODUODENOSCOPY N/A 10/19/2021   Procedure: ESOPHAGOGASTRODUODENOSCOPY (EGD);  Surgeon: Jinny Carmine, MD;  Location: [facility] ENDOSCOPY;  Service: Endoscopy;  Laterality: N/A;   ESOPHAGOGASTRODUODENOSCOPY (EGD) WITH PROPOFOL  N/A 11/30/2018   Procedure: ESOPHAGOGASTRODUODENOSCOPY (EGD) WITH PROPOFOL ;  Surgeon: Therisa Bi, MD;  Location: [facility] ENDOSCOPY;  Service: Gastroenterology;  Laterality: N/A;   ESOPHAGOGASTRODUODENOSCOPY (EGD) WITH PROPOFOL  N/A 12/01/2018   Procedure: ESOPHAGOGASTRODUODENOSCOPY (EGD) WITH PROPOFOL ;  Surgeon: Jinny Carmine, MD;  Location: [facility] ENDOSCOPY;  Service: Endoscopy;  Laterality: N/A;   HOLMIUM LASER APPLICATION Bilateral 05/20/2015    Procedure: HOLMIUM LASER APPLICATION;  Surgeon: Ricardo Likens, MD;  Location: [facility];  Service: Urology;  Laterality: Bilateral;   IMPLIMENTATION OF A HYPOGLOSSAL NERVE STIMULATOR  12/08/2017   OSA    IR GASTROSTOMY TUBE MOD SED  01/18/2024   IR RADIOLOGIST EVAL & MGMT  02/13/2024   LEFT HEART CATHETERIZATION WITH CORONARY ANGIOGRAM N/A 09/06/2013   Procedure: LEFT HEART CATHETERIZATION WITH CORONARY ANGIOGRAM;  Surgeon: Victory LELON Claudene DOUGLAS, MD;  Location: [facility] CATH LAB;  Service: Cardiovascular;  Laterality: N/A;   LEFT KNEE CAP  SURGERY ABOUT 40 YRS AGO  ? 1970     STONE EXTRACTION WITH BASKET Left 03/19/2014   Procedure: STONE EXTRACTION WITH BASKET;  Surgeon: Mickey Ricardo KATHEE Likens, MD;  Location: [facility];  Service: Urology;  Laterality: Left;   TRANSURETHRAL RESECTION OF PROSTATE N/A 08/25/2021   Procedure: TRANSURETHRAL RESECTION OF THE PROSTATE (TURP);  Surgeon: Likens Ricardo, MD;  Location: [facility];  Service: Urology;  Laterality: N/A;  1 HR   Patient Active Problem List   Diagnosis Date Noted   S/P gastrostomy tube (G tube) placement, follow-up exam 02/18/2024   Skin infection at gastrostomy tube site (HCC) 02/06/2024   Protein-calorie malnutrition, severe 01/23/2024   Multifocal pneumonia 01/22/2024   Fall 01/03/2024   Hematoma of right hip 01/03/2024   Chronic pulmonary aspiration 12/29/2023   Silent aspiration 12/29/2023   Moderate protein-calorie malnutrition (weight for age 33-74% of standard) 12/29/2023   Bilateral leg weakness 12/29/2023   Chronic respiratory failure with hypoxia, on home oxygen  therapy (HCC) 12/29/2023   Chronic constipation 12/29/2023   Somnolence 12/29/2023   Frequent falls 12/29/2023   Feeding difficulties, unspecified 12/20/2023   Shortness of breath 12/20/2023   Abnormal CT of the chest 10/10/2023   Elevated blood sugar 06/30/2023   Carotid stenosis 05/11/2023   Spinal stenosis in cervical region 03/13/2023   Osteoarthritis of spine with  radiculopathy, cervical region 07/14/2022   Cervicogenic headache 11/30/2021   Chronic neck pain 11/30/2021   Weight loss, non-intentional    Dysphagia    Polyp of colon    Prostatic hyperplasia 08/25/2021   Chronic insomnia 03/19/2020   Peripheral neuropathy 03/19/2020   Iron deficiency anemia 01/06/2020   Allergic dermatitis 07/08/2019   Osteoarthritis of left knee 02/26/2019   History of colonic polyps    History of duodenal ulcer    CKD (chronic kidney disease), stage IIIa    Bilateral chronic knee pain 11/09/2018   Epistaxis 10/02/2018   Subjective tinnitus, bilateral 10/02/2018   Chronic pain of right thumb 02/22/2018   Acute bilateral thoracic back pain 01/23/2018   Osteoporosis without current pathological fracture 02/10/2017   Hypothyroidism 11/02/2016   Thoracic compression fracture (HCC) 09/06/2016   PVC's (premature ventricular contractions) 07/07/2016   Hypoalbuminemia 06/28/2016   IgA nephropathy 06/07/2016   Acute buttock pain 05/23/2016   MDD (major depressive disorder), recurrent episode, moderate (HCC) 11/20/2015   OSA (obstructive sleep apnea) 02/05/2014   Orthostatic hypotension 02/07/2013   Scotoma involving central area 01/29/2013   Optic  neuropathy 01/28/2013   HEMATURIA UNSPECIFIED 01/21/2010   HLD (hyperlipidemia) 07/05/2006   Generalized anxiety disorder 07/05/2006   CAD (coronary artery disease) 07/05/2006   GERD 07/05/2006    ONSET DATE: 03/2023 following ACDF; Date of referral 12/28/2023: date of this referral 02/01/2024  REFERRING DIAG:  T17.908D (ICD-10-CM) - Chronic pulmonary aspiration, subsequent encounter  T17.900D (ICD-10-CM) - Silent aspiration, subsequent encounter  E44.0 (ICD-10-CM) - Moderate protein-calorie malnutrition  R13.10 (ICD-10-CM) - Dysphagia, unspecified type    THERAPY DIAG:  Oropharyngeal dysphagia  Rationale for Evaluation and Treatment Rehabilitation  SUBJECTIVE:   PERTINENT HISTORY and DIAGNOSTIC FINDINGS:   Pt is a 73 yo male w/ ACDF surgery 03/2023.  PMH: GERD(Not on a PPI per chart), Actinic keratosis (02/04/2021), Anemia, Anxiety, Asymptomatic LV dysfunction, BCC (basal cell carcinoma of skin) (08/05/2020), Berger's disease, CAD (coronary artery disease) (06/2005), resting tremor, Depression, Dilated aortic root (HCC), Hematuria, Hyperlipidemia, Hypothyroidism, Insomnia, Left ureteral stone, Orthostatic hypotension, OSA (obstructive sleep apnea), PVC's (premature ventricular contractions) (07/07/2016), Renal disorder, Rosacea, and Urticaria.  Pt is followed by Cardiology for Coronary Artery Stenosis currently; also followed for Migraines.     EGD in 2023 revealed Gastritis.      CT of Chest 10/2023: 1. Interim development of patchy consolidation and ground-glass   disease most evident at the right middle lobe and right lower lobes   with additional areas of mild ground-glass disease and scattered   clustered nodularity, findings are suspicious for multifocal   infection/pneumonia. Imaging follow-up to resolution is recommended.   2. Emphysema.   3. Aortic atherosclerosis.            Dysphagia - reported by pt as Chronic, ever since neck surgery in December 2024. Pt was seen in the emergency room s/p Fall, notes detailing that he slipped on soap in the shower, fell backwards on right side hit head and neck against shower seat, possible brief loss of consciousness, per chart.  ACDF 03/2023 per chart.  Since the neck surgery in 03/2023, he has noted food, liquids and pill getting stuck on the right side of my throat more.  I used to cough a lot more but not as much now.  I cough up what gets stuck..  He has lost significant weight per chart notes due to decreased eating.  Pt endorsed using his home O2 when he feels SOB.   MBSS 12/26/2023 Patient appears to present w/ Chronic, moderate-severe Pharyngeal phase dysphagia per this assessment today. It appeared there was some impact from changes in the  Cervical Esophagus s/p ACDF surgery(03/2023 per chart notes) including structural/tissue changes in/aournd the CP segment and cervical esophagus. Pt with intermittent silent aspiration of thin liquids.   Recommend continue current diet of easy to eat foods at home; thin liquids via cup- at this time. Oral care Before any oral intake. Swallow Strategies Recommended: No straws, moistened foods, SMALL bites/sips, Head Turn RIGHT w/ slight chin tuck, multiple swallows to aid clearing, AND strong Cough/throat clear b/t bites/sips and at end of meal.  D/t the Chronicity of pt's Pharyngeal phase Dysphagia w/ weight loss and pulmonary impact per recent Chest CT, pt and Family may need to discuss alternative means of nutrition/hydration as support w/ his Medical Team/PCP, while seeking swallow therapy.  Received ENT report dated 01/12/2024 Laryngoscopy revealed  Moderate vocal fold bowing bilaterally and moderate glottic gamp with phonation Impression/plan: Aspiration on MBSS and significant weight loss since ACDF last year. TVF shows moderate sized glottic gap on laryngoscoy but mobile  vocal folds bilaterally. Pooling in pyriform on MBSS but no significant pooling of secretions on laryngoscopy. I feel that between speech therpay and possibly bilateral injection laryngoplasty, hopefully his swallowing will significantly improve. Will make referral to Dr Brien at [facility].   Pt and his wife report appt with Dr Brien in January 2026.  10/10/205 PEG placed  01/22/2024 thru 01/24/2024 Hospitalized d/t fall and confusion CT of the chest on arrival showed multifocal pneumonia and possible free air. When reviewed further with interventional radiology team it was determined this was an appropriate amount of free air secondary to recent PEG tube placement. Patient was initiated on antibiotic therapy for pneumonia and improved significantly.   02/07/2024 CT Chest 1. Evolving multifocal inflammatory  infiltrates, progressive within the right upper lobe and improved within the lung bases bilaterally. Recommend appropriate clinical management and imaging follow-up per clinical course. 2. Mild emphysema. Given emphysema as an independent lung cancer risk factor, consider evaluation for a low-dose CT lung cancer screening program if the patient is 73 years old and otherwise eligible.   PAIN:  Are you having pain? No  FALLS: Has patient fallen in last 6 months?  See PT evaluation for details  LIVING ENVIRONMENT: Lives with: lives with their spouse Lives in: House/apartment  PLOF:  Level of assistance: Independent with ADLs, Independent with IADLs Employment: Retired   PATIENT GOALS  to improve swallow function, nutrition and hydration, decrease aspiration risks  SUBJECTIVE STATEMENT: Pt eager, pleasant, wife attended session Pt accompanied by: self, significant other   OBJECTIVE:   TODAY'S TREATMENT:  Skilled treatment session focused on pt's dysphagia goals. SLP facilitated the session by providing the following interventions:   Education provided on the results of recent MBSS. Some subjective improvement observed but continued silent aspiration. Continue to recommend trials of thin liquids via spoon (no need for compensatory swallow strategies as these were ineffective in preventing aspiration). Education provided on monitoring for increased coughing or throat clearing when consuming water  as a way to potentially assess increased laryngeal sensation.   Respiratory Muscle Strengthening: Pt observed with confusion regarding placement and use of IMST and EMST. Pt asked to pull up pictures on his phone about correct placement of devices. SLP provided max verbal and visual demonstration on how to place the IMST adaptor on EMST. Provided further written instructions as well.  EMST 75 set to 35 cm H2O: Pt able to complete two sets of 8 reps independently. SLP increased resistance by  a half turn past 35 cm H2O. Pt able to complete about 2 sets of 6 reps independently.  IMST set to about a half turn past 35 cm H2O: Pt able to complete 3 sets of 7-8 reps independently.  New pharyngeal exercise targeting UES opening was introduced with skilled education and instruction provided:  Head Extension Swallowing Exercise - Pt able to complete 2 sets of 8 repetitions with min A. Written instructions provided and recommend pt add to HEP: 3 sets of 8 repetitions, pt agreeable.  Skilled observation of pt consuming water  via spoon. No overt s/s of aspiration observed.   PATIENT EDUCATION: Education details: ST POC, repeat MBSS in 8 to 12 weeks Person educated: Patient and Spouse Education method: Chief Technology [employer] Education comprehension: needs further education  HOME EXERCISE PROGRAM:     See above   GOALS: Goals reviewed with patient? Yes  SHORT TERM GOALS: Target date: 10 sessions  Updated 02/28/2024  Updated 02/09/2024 With Mod I, pt will perform compensatory swallow  strategies (chin tuck, multiple swallows, head turn) to eliminate s/s of aspiration when consuming least restrictive diet. Baseline: Goal status: INITIAL: moderate A: Progress made, fluctuating between supervision and independent cues  2.  With Min A, pt will complete (supraglottic, swallow, Mednelson maneuver, effortful swallow) to improve oral motor weakness, tongue base retraction, hyolarnygeal excursion, airway protection and/or clearance of the bolus through the pharynx. Baseline:  Goal status: INITIAL: progress made; moderate A: Progress made, fluctuating between supervision and independent cues  3. Pt will demonstrate overt s/s of aspiration when consuming ice chips.   Baseline: Goal status: INITIAL: no overt s/s of aspiration observed during today's session: Intermittent overt s/s observed   LONG TERM GOALS: Target date: 03/25/2024  Updated 02/28/2024  Updated: 02/09/2024 With Mod I, pt  will demonstrate the ability to adequately self-monitor swallowing skills and perform appropriate compensatory techniques to reduce s/s of aspiration and to safely consume least restrictive diet.     Baseline:  Goal status: INITIAL: continues to be appropriate goal  2.  Pt will participate in repeat instrumental swallow evaluation to assess least restrictive diet.  Baseline:  Goal status: INITIAL: continues to be appropriate goal: Modified Barium scheduled for 11/21   ASSESSMENT:  CLINICAL IMPRESSION: Patient is a 73 y.o. male who was seen today for a clinical swallow re-evaluation d/t severe pharyngeal phase dysphagia following ACDF (03/2023).    See the above treatment note for details.   OBJECTIVE IMPAIRMENTS include dysphagia. These impairments are limiting patient from safety when swallowing. Factors affecting potential to achieve goals and functional outcome are severity of impairments and malnutrition. Patient will benefit from skilled SLP services to address above impairments and improve overall function.  REHAB POTENTIAL: Good  PLAN: SLP FREQUENCY: 1-2x/week  SLP DURATION: 12 weeks  PLANNED INTERVENTIONS: Aspiration precaution training, Pharyngeal strengthening exercises, Diet toleration management , Oral motor exercises, SLP instruction and feedback, Compensatory strategies, and Patient/family education   Happi B. Rubbie, M.S., CCC-SLP, Tree Surgeon Certified Brain Injury Specialist [facility]  [facility] Rehabilitation Services Office [phone] Ascom [phone] Fax [phone]

## 2024-03-05 NOTE — Therapy (Signed)
 OUTPATIENT PHYSICAL THERAPY TREATMENT/  Re-certification   Patient Name: Shawn Lam MRN: [id number] DOB:[birth year], 73 y.o., male Today's Date: 03/05/2024  END OF SESSION:  PT End of Session - 03/05/24 0842     Visit Number 11    Number of Visits 27    Date for Recertification  04/30/24    PT Start Time 0926    PT Stop Time 1012    PT Time Calculation (min) 46 min    Equipment Utilized During Treatment Gait belt    Activity Tolerance Patient tolerated treatment well    Behavior During Therapy [facility] for tasks assessed/performed           Past Medical History:  Diagnosis Date   Actinic keratosis 02/04/2021   right lateral neck   Anemia    Anxiety    Asymptomatic LV dysfunction    50-55% by echo 06/2016   BCC (basal cell carcinoma of skin) 08/05/2020   left neck infra auricular - nodular pattern - treated with [facility] 09/17/2020   Berger's disease    CAD (coronary artery disease) 06/2005   PCI of LAD and RCA   Depression    Dilated aortic root    4cm at sinus of Valsalva   Hematuria    Hyperlipidemia    Hypothyroidism    Insomnia    Left ureteral stone    Orthostatic hypotension    negative tilt table   OSA (obstructive sleep apnea)    moderate with AHI 17/hr, uses CPAP nightly(01/15/19 - has Inspire implant)   PVC's (premature ventricular contractions) 07/07/2016   Renal disorder    PT IS SEEING DR. BETSEY- NEPHROLOGIST   Rosacea    Urticaria    Past Surgical History:  Procedure Laterality Date   ANTERIOR CERVICAL DECOMP/DISCECTOMY FUSION N/A 03/13/2023   Procedure: CERVICAL FOUR-FIVE, CERVICAL FIVE-SIX ANTERIOR CERVICAL DISCECTOMY/DECOMPRESSION FUSION;  Surgeon: Onetha Kuba, MD;  Location: [facility] OR;  Service: Neurosurgery;  Laterality: N/A;  3C   CARDIAC CATHETERIZATION  09/06/2013   [facility]   CARDIAC CATHETERIZATION     COLONOSCOPY WITH PROPOFOL  N/A 01/21/2019   Procedure: COLONOSCOPY WITH PROPOFOL ;  Surgeon: Jinny Carmine, MD;  Location: [facility];   Service: Endoscopy;  Laterality: N/A;  sleep apnea   COLONOSCOPY WITH PROPOFOL  N/A 10/19/2021   Procedure: COLONOSCOPY WITH PROPOFOL ;  Surgeon: Jinny Carmine, MD;  Location: [facility] ENDOSCOPY;  Service: Endoscopy;  Laterality: N/A;   CORONARY ANGIOPLASTY  2004   STENT PLACEMENT   CYSTOSCOPY WITH RETROGRADE PYELOGRAM, URETEROSCOPY AND STENT PLACEMENT Left 03/19/2014   Procedure: CYSTOSCOPY WITH RETROGRADE PYELOGRAM, URETEROSCOPY AND STENT PLACEMENT;  Surgeon: Mickey Ricardo KATHEE Alvaro, MD;  Location: [facility] ORS;  Service: Urology;  Laterality: Left;   CYSTOSCOPY WITH RETROGRADE PYELOGRAM, URETEROSCOPY AND STENT PLACEMENT Bilateral 05/20/2015   Procedure:  CYSTOSCOPY WITH BILATERAL RETROGRADE PYELOGRAM,RIGHT  DIAGNOSTIC URETEROSCOPY ,LEFT URETEROSCOPY WITH HOLMIUM LASER  AND BILATERAL  STENT PLACEMENT ;  Surgeon: Ricardo Alvaro, MD;  Location: [facility];  Service: Urology;  Laterality: Bilateral;   DRUG INDUCED ENDOSCOPY N/A 10/20/2017   Procedure: DRUG INDUCED SLEEP ENDOSCOPY;  Surgeon: Carlie Clark, MD;  Location: [facility];  Service: ENT;  Laterality: N/A;   EP IMPLANTABLE DEVICE N/A 05/13/2016   Procedure: Loop Recorder Insertion;  Surgeon: Elspeth JAYSON Sage, MD;  Location: [facility]VASIVE CV LAB;  Service: Cardiovascular;  Laterality: N/A;   ESOPHAGOGASTRODUODENOSCOPY N/A 10/19/2021   Procedure: ESOPHAGOGASTRODUODENOSCOPY (EGD);  Surgeon: Jinny Carmine, MD;  Location: [facility];  Service: Endoscopy;  Laterality:  N/A;   ESOPHAGOGASTRODUODENOSCOPY (EGD) WITH PROPOFOL  N/A 11/30/2018   Procedure: ESOPHAGOGASTRODUODENOSCOPY (EGD) WITH PROPOFOL ;  Surgeon: Therisa Bi, MD;  Location: [facility] ENDOSCOPY;  Service: Gastroenterology;  Laterality: N/A;   ESOPHAGOGASTRODUODENOSCOPY (EGD) WITH PROPOFOL  N/A 12/01/2018   Procedure: ESOPHAGOGASTRODUODENOSCOPY (EGD) WITH PROPOFOL ;  Surgeon: Jinny Carmine, MD;  Location: [facility] ENDOSCOPY;  Service: Endoscopy;  Laterality: N/A;   HOLMIUM LASER APPLICATION Bilateral 05/20/2015    Procedure: HOLMIUM LASER APPLICATION;  Surgeon: Ricardo Likens, MD;  Location: [facility];  Service: Urology;  Laterality: Bilateral;   IMPLIMENTATION OF A HYPOGLOSSAL NERVE STIMULATOR  12/08/2017   OSA    IR GASTROSTOMY TUBE MOD SED  01/18/2024   IR RADIOLOGIST EVAL & MGMT  02/13/2024   LEFT HEART CATHETERIZATION WITH CORONARY ANGIOGRAM N/A 09/06/2013   Procedure: LEFT HEART CATHETERIZATION WITH CORONARY ANGIOGRAM;  Surgeon: Victory LELON Claudene DOUGLAS, MD;  Location: [facility] CATH LAB;  Service: Cardiovascular;  Laterality: N/A;   LEFT KNEE CAP  SURGERY ABOUT 40 YRS AGO  ? 1970     STONE EXTRACTION WITH BASKET Left 03/19/2014   Procedure: STONE EXTRACTION WITH BASKET;  Surgeon: Mickey Ricardo KATHEE Likens, MD;  Location: [facility];  Service: Urology;  Laterality: Left;   TRANSURETHRAL RESECTION OF PROSTATE N/A 08/25/2021   Procedure: TRANSURETHRAL RESECTION OF THE PROSTATE (TURP);  Surgeon: Likens Ricardo, MD;  Location: [facility];  Service: Urology;  Laterality: N/A;  1 HR   Patient Active Problem List   Diagnosis Date Noted   S/P gastrostomy tube (G tube) placement, follow-up exam 02/18/2024   Skin infection at gastrostomy tube site (HCC) 02/06/2024   Protein-calorie malnutrition, severe 01/23/2024   Multifocal pneumonia 01/22/2024   Fall 01/03/2024   Hematoma of right hip 01/03/2024   Chronic pulmonary aspiration 12/29/2023   Silent aspiration 12/29/2023   Moderate protein-calorie malnutrition (weight for age 106-74% of standard) 12/29/2023   Bilateral leg weakness 12/29/2023   Chronic respiratory failure with hypoxia, on home oxygen  therapy (HCC) 12/29/2023   Chronic constipation 12/29/2023   Somnolence 12/29/2023   Frequent falls 12/29/2023   Feeding difficulties, unspecified 12/20/2023   Shortness of breath 12/20/2023   Abnormal CT of the chest 10/10/2023   Elevated blood sugar 06/30/2023   Carotid stenosis 05/11/2023   Spinal stenosis in cervical region 03/13/2023   Osteoarthritis of spine with radiculopathy,  cervical region 07/14/2022   Cervicogenic headache 11/30/2021   Chronic neck pain 11/30/2021   Weight loss, non-intentional    Dysphagia    Polyp of colon    Prostatic hyperplasia 08/25/2021   Chronic insomnia 03/19/2020   Peripheral neuropathy 03/19/2020   Iron deficiency anemia 01/06/2020   Allergic dermatitis 07/08/2019   Osteoarthritis of left knee 02/26/2019   History of colonic polyps    History of duodenal ulcer    CKD (chronic kidney disease), stage IIIa    Bilateral chronic knee pain 11/09/2018   Epistaxis 10/02/2018   Subjective tinnitus, bilateral 10/02/2018   Chronic pain of right thumb 02/22/2018   Acute bilateral thoracic back pain 01/23/2018   Osteoporosis without current pathological fracture 02/10/2017   Hypothyroidism 11/02/2016   Thoracic compression fracture (HCC) 09/06/2016   PVC's (premature ventricular contractions) 07/07/2016   Hypoalbuminemia 06/28/2016   IgA nephropathy 06/07/2016   Acute buttock pain 05/23/2016   MDD (major depressive disorder), recurrent episode, moderate (HCC) 11/20/2015   OSA (obstructive sleep apnea) 02/05/2014   Orthostatic hypotension 02/07/2013   Scotoma involving central area 01/29/2013   Optic neuropathy 01/28/2013   HEMATURIA UNSPECIFIED 01/21/2010  HLD (hyperlipidemia) 07/05/2006   Generalized anxiety disorder 07/05/2006   CAD (coronary artery disease) 07/05/2006   GERD 07/05/2006    PCP: Dr. Greig Ring    REFERRING PROVIDER: Dr. Arley Helling   REFERRING DIAG: M54.6 (ICD-10-CM) - Acute bilateral thoracic back pain  THERAPY DIAG:  Pain in thoracic spine  Cervicalgia  Abnormality of gait  Muscle weakness (generalized)  Balance disorder  Rationale for Evaluation and Treatment: Rehabilitation  ONSET DATE: May 2025    SUBJECTIVE:                                                                                                                                                                                                          SUBJECTIVE STATEMENT  Pt reports that the neck exercises have been helping his neck pain. No other significant updates.  PERTINENT HISTORY:  73yoM referred to physical therapy for ongoing neck pain following cervical fusion in May 2020 and newly developed thoracic pain from T12 compression fracture that occurred sometime in Spring of 2025 (associated radicular pain pattern). Pt has pain localized to upper traps and suboccipitals. Pt attempted DC of prednisone  and gabapentin  two weeks prior to evaluation here, severe decline in function due to pain, barely able to walk for two weeks. He describes feeling increased fatigue and weakness to the point where he struggled to walk. Pt also reported repeated falls in the setting of orthostatic hypotension with him experiencing syncope when he stands up from a seated position. He describes feeling a burning sensation that is also localized to his scapulae and that it occurs when he is doing UE activities like washing his car that is likely the radicular pain from thoracic vertebrae compression fracture.  PAIN: Are you having pain? 03/05/2024: 6/10 today in the neck  PRECAUTIONS: None Pt has thoracic compression fractures and is s/p cervical spine surgery   WEIGHT BEARING RESTRICTIONS: No  FALLS:  Has patient fallen in last 6 months? Yes. Number of falls 6, pt reports that falls are happening in the context of syncope when standing up from a seated position (Orthostatic Hypotension) , where he blacks out and falls. He is following up with his PCP, Dr. Karlene who is working with him on stabilizing his blood pressure and she has recently taken him off Gabapentin  to see if this helps. Pt reports that he does note an improvement in his symptoms with less light headedness since going off medication.   LIVING ENVIRONMENT: Lives with: lives with their spouse Lives in: House/apartment Stairs: No Has following equipment at home:  Single point cane and  Walker - 4 wheeled  OCCUPATION: Retired     PLOF: Independent  PATIENT GOALS: Improve balance, decrease falls.       OBJECTIVE:   Note: Objective measures were completed at evaluation unless otherwise noted. Reassessment Measures 02/09/24: -eyes closed balance, normal stance: <5sec with retropulsion each time, ModA falls recovery  -narrow stance eyes open balance: <10sec with LOB, ModA falls recovery  -Long distance AMB assessment c SPC: 157ft with 2 DOE standing breaks ~15 seconds each, 15sec; crossover gait intermittently without LOB; -standing vitals taken at end of walk: stable and comparable to prior seated vitals.  -cervical rotation ROM: 50deg Rt, 53 degree left  -shoulder flexion MMT: 5/5 Rt, 4+/5 left; shoulder ABDCT MMT: 5/5 bilat  -ankle DF assessment: can perform >10x full range while leaning against wall (adequate for gait needs)  -sensory testing about ankles, lower legs: (feels intact, but pt reports constance N/T below ankle to his toes)  Standing BP assessment: 02/28/24 -124/17mmHg 95bpm standing @ 0 -122/50mmHg 90bpm standing @ 1 110/16mmHg 92bpm standing @ 2  TREATMENT DATE 03/05/24:   *Functional outcome measure performed today for goal update for re-certification.*  Pt performed 5 time sit<>stand (5xSTS): 17.28 sec (>15 sec indicates increased fall risk)    PT instructed pt in TUG: 11.61-trial 1, 10.93- trial 2; avg 11.27sec (average of 3 trials; >13.5 sec indicates increased fall risk)   10 Meter Walk Test: Patient instructed to walk 10 meters (32.8 ft) as quickly and as safely as possible at their normal speed x2 and at a fast speed x2. Time measured from 2 meter mark to 8 meter mark to accommodate ramp-up and ramp-down.  Normal speed 1: 0.83 m/s, 12.04s Normal speed 2: 0.87 m/s, 11.50s Average Normal speed: 0.85 m/s Fast speed 1: 1.02 m/s, 9.83s  Fast speed 2: 0.93 m/s,10.80s Average Fast speed: 0.975 m/s Cut off scores: <0.4 m/s = household  Ambulator, 0.4-0.8 m/s = limited community Ambulator, >0.8 m/s = community Ambulator, >1.2 m/s = crossing a street, <1.0 = increased fall risk MCID 0.05 m/s (small), 0.13 m/s (moderate), 0.06 m/s (significant)  (ANPTA Core Set of Outcome Measures for Adults with Neurologic Conditions, 2018)   6 Min Walk Test:  Instructed patient to ambulate as quickly and as safely as possible for 6 minutes using LRAD. Patient was allowed to take standing rest breaks without stopping the test, but if the patient required a sitting rest break the clock would be stopped and the test would be over.  Results: 975 feet (297 meters, Avg speed 0.827m/s) using a SPC with supervision assist. Results indicate that the patient has reduced endurance with ambulation compared to age matched norms.  Age Matched Norms: 40-69 yo M: 47 F: 80, 84-79 yo M: 21 F: 471, 34-89 yo M: 417 F: 392 MDC: 58.21 meters (190.98 feet) or 50 meters (ANPTA Core Set of Outcome Measures for Adults with Neurologic Conditions, 2018)   Patient demonstrates increased fall risk as noted by score of  47/56 on Berg Balance Scale.  (<36= high risk for falls, close to 100%; 37-45 significant >80%; 46-51 moderate >50%; 52-55 lower >25%)   Patient demonstrates increased fall risk as noted by score of 17/30 on  Functional Gait Assessment.   <22/30 = predictive of falls, <20/30 = fall in 6 months, <18/30 = predictive of falls in PD MCID: 5 points stroke population, 4 points geriatric population (ANPTA Core Set of Outcome Measures for Adults with Neurologic Conditions,  2018)    [facility] PT Assessment - 03/05/24 0001       Balance   Balance Assessed Yes      Berg Balance Test   Sit to Stand Able to stand without using hands and stabilize independently    Standing Unsupported Able to stand safely 2 minutes    Sitting with Back Unsupported but Feet Supported on Floor or Stool Able to sit safely and securely 2 minutes    Stand to Sit Sits safely with minimal  use of hands    Transfers Able to transfer safely, minor use of hands    Standing Unsupported with Eyes Closed Able to stand 10 seconds with supervision    Standing Unsupported with Feet Together Able to place feet together independently and stand for 1 minute with supervision    From Standing, Reach Forward with Outstretched Arm Can reach forward >12 cm safely (5)    From Standing Position, Pick up Object from Floor Able to pick up shoe safely and easily    From Standing Position, Turn to Look Behind Over each Shoulder Looks behind one side only/other side shows less weight shift    Turn 360 Degrees Able to turn 360 degrees safely but slowly    Standing Unsupported, Alternately Place Feet on Step/Stool Able to stand independently and safely and complete 8 steps in 20 seconds    Standing Unsupported, One Foot in Front Able to plae foot ahead of the other independently and hold 30 seconds    Standing on One Leg Able to lift leg independently and hold equal to or more than 3 seconds    Total Score 47      Functional Gait  Assessment   Gait assessed  Yes    Gait Level Surface Walks 20 ft in less than 5.5 sec, no assistive devices, good speed, no evidence for imbalance, normal gait pattern, deviates no more than 6 in outside of the 12 in walkway width.    Change in Gait Speed Able to change speed, demonstrates mild gait deviations, deviates 6-10 in outside of the 12 in walkway width, or no gait deviations, unable to achieve a major change in velocity, or uses a change in velocity, or uses an assistive device.    Gait with Horizontal Head Turns Performs head turns smoothly with slight change in gait velocity (eg, minor disruption to smooth gait path), deviates 6-10 in outside 12 in walkway width, or uses an assistive device.    Gait with Vertical Head Turns Performs task with slight change in gait velocity (eg, minor disruption to smooth gait path), deviates 6 - 10 in outside 12 in walkway width or  uses assistive device    Gait and Pivot Turn Turns slowly, requires verbal cueing, or requires several small steps to catch balance following turn and stop    Step Over Obstacle Is able to step over one shoe box (4.5 in total height) but must slow down and adjust steps to clear box safely. May require verbal cueing.    Gait with Narrow Base of Support Ambulates 4-7 steps.    Gait with Eyes Closed Walks 20 ft, slow speed, abnormal gait pattern, evidence for imbalance, deviates 10-15 in outside 12 in walkway width. Requires more than 9 sec to ambulate 20 ft.    Ambulating Backwards Walks 20 ft, uses assistive device, slower speed, mild gait deviations, deviates 6-10 in outside 12 in walkway width.    Steps Alternating feet, must use rail.  Total Score 17           NECK DISABILITY INDEX  Date: 03/05/2024  Score  Pain intensity 3 = The pain is fairly severe at the moment  2. Personal care (washing, dressing, etc.) 1 =  I can look after myself normally but it causes extra pain  3. Lifting 2 = Pain prevents me lifting heavy weights off the floor, but I can manage if they are  conveniently placed, for example on a table  4. Reading 3 = I can't read as much as I want because of moderate pain in my neck  5. Headaches 1 =  I have slight headaches, which come infrequently  6. Concentration 2 = I have a fair degree of difficulty in concentrating when I want to  7. Work 2 = I can do most of my usual work, but no more  8. Driving 4 =  I can hardly drive at all because of severe pain in my neck  9. Sleeping 3 =  My sleep is moderately disturbed (2-3 hrs sleepless)  10. Recreation 4 =  I can hardly do any recreation activities because of pain in my neck  Total 25/50   Minimum Detectable Change (90% confidence): 5 points or 10% points   PATIENT EDUCATION:  Education details: Pt progressing so far with cardio and balance activity.     Person educated: Patient Education method: Software Engineer Education comprehension: verbalized understanding, returned demonstration, and verbal cues required  HOME EXERCISE PROGRAM:  Need to create new HEP for balance once pt is ready. (Formally DC neck HEP on 02/09/24)   ASSESSMENT:  CLINICAL IMPRESSION: Discussed with patient plan for today of performing functional outcome measures for new goal creation centered around global weakness, deconditioning, and balance difficulty. Pt TUG score today shows good timing and a decreased risk for falls. Pt continued to show adequate gait speed as seen in , classifying the pt as a community ambulator in the normal speed and fast speed categories. Pt 5STS score indicates the patient as an increased fall risk. Pt performance of the BERG indicates the pt is a moderate fall risk, with most difficulty noted with SLS, eyes closed, and 360* turning. Pt FGA score of 17/30 indicates increased risk for falls. Pt shows great distance performed during , reaching well over the aged matched norm for his age. Pt was agreeable and motivated to continue working on deconditioning and balance goals in therapy, as well as cervical and back pain improvement. Pt will continue to benefit from skilled therapy to address remaining deficits in order to improve overall QoL and return to PLOF.    OBJECTIVE IMPAIRMENTS: decreased balance, decreased ROM, decreased strength, hypomobility, impaired flexibility, impaired UE functional use, and pain.   ACTIVITY LIMITATIONS: carrying, lifting, standing, bathing, dressing, reach over head, and hygiene/grooming PARTICIPATION LIMITATIONS: cleaning, laundry, shopping, community activity, and yard work PERSONAL FACTORS: Age and 3+ comorbidities: CAD, Depression, are also affecting patient's functional outcome.  REHAB POTENTIAL: Good CLINICAL DECISION MAKING: Stable/uncomplicated EVALUATION COMPLEXITY: Low   GOALS: Goals reviewed with patient? No  SHORT TERM GOALS: Target date:  01/03/2024  Patient will demonstrate undestanding of home exercise plan by performing exercises correctly with evidence of good carry over with min to no verbal or tactile cues .   Baseline: NT 12/25/23: Performing exercises independently  Goal status: ACHIEVED   2.  Patient will demonstrate understanding of how to modify exercises to decrease thoracic and cervical pain exacerbation for  improved function and to decrease incidence of pain flare ups.  Baseline: Does not currently have strategy to manage this  Goal status: NOT MET      LONG TERM GOALS: Target date: 03/13/2024  Patient will show a statistically significant improvement in his neck function as evidence by >=7.5 pt decrease in her neck disability index score to better be able to move neck to improve visual field while driving to avoid accidents. Vernel et al, 2009) Baseline: 33/50 (66%); 02/09/24: 66% 03/01/2024: 50% Goal status: NOT MET     2.  Patient will increase cervical mobility by  >=5 degrees AROM for all planes of motion (flexion, extension, side bending, and rotation) for improved cervical function and evidence of pain reduction. Baseline: Cervical AROM Flex 30, Ext 30, Rot R/L 40/40, SB R/L 35/35; 02/09/24: Rt rotation 50 degrees, Left rotation: 53 degress  03/01/2024: Flex:  30*   Ext: 20*      Rot L: 32*     Rot R: 30*      SB L: 20*       SB R: 15* Goal status: MET    3.  Patient will improve shoulder and periscapular strength by 1/3 grade MMT (4- to 4) for improved cervical stability and UE function to carry out upper extremity activities like washing his car and gardening without being limited by pain and discomfort.  Baseline: Shoulder Flex R/L 4/4, Shoulder Abd R/L 4/4; 10/31: shoulder flexion Rt: 5/5, left 4+/5; shoulder ABDCT 5/5 bilat  03/01/2024: Shld Flex R: 5 Shld Flex L: 4+ , Shld abd R: 4 +  Shld Abd L: 4 Goal status: MET    4.  Patient will reduce neck and thoracic pain to <=4/10 NRPS while performing UE  activities like washing his car and doing yard work as evidence of improved cervical and thoracic function.  Baseline: 10/10 cervical paraspinal and rhomboid pain with UE activity; up to a 9/10 intermittently with activity   03/01/2024: 8/10 NPS UE activity Goal status: NOT MET    5. Patient (> 80 years old) will complete five times sit to stand test in < 15 seconds indicating an increased LE strength and improved balance. Baseline: 17.28s Goal status: INITIAL  6. Patient will increase Berg Balance score by > 6 points to demonstrate decreased fall risk during functional activities Baseline: 47/56 Goal status: INITIAL  7. Patient will increase 10 meter walk test to >1.30m/s as to improve gait speed for better community ambulation and to reduce fall risk. Baseline: normal pace: 0.32m/s; fast pace: 0.975s Goal status: INITIAL  8. Patient will reduce timed up and go to <10 seconds to reduce fall risk and demonstrate improved transfer/gait ability. Baseline: 11.27s Goal status: INITIAL  9. Patient will decrease distance of by 184ft in order to increase cardiovascular endurance for improved community ambulation distance. Baseline: 910ft in 6 min w/ SPC Goal status: INITIAL  10. Pt will increase score on FGA by 4 points in order to increase functional ambulation and increase confidence in community environments. Baseline: 17/30 Goal status: INITIAL PLAN:  PT FREQUENCY: 1-2x/week PT DURATION: 8 weeks PLANNED INTERVENTIONS: [id number]- PT Re-evaluation, 97750- Physical Performance Testing, 97110-Therapeutic exercises, 97530- Therapeutic activity, 97112- Neuromuscular re-education, 97535- Self Care, [id number]- Manual therapy, [phone]- Gait training, [phone]- Canalith repositioning, [id number]- Aquatic Therapy, [phone]- Electrical stimulation (unattended), [phone]- Electrical stimulation (manual), [id number]- Traction (mechanical), 20560 (1-2 muscles), 20561 (3+ muscles)- Dry Needling, Patient/Family education,  Balance training, Joint mobilization, Joint manipulation, Spinal manipulation, Spinal mobilization,  Vestibular training, DME instructions, Cryotherapy, and Moist heat  PLAN FOR NEXT SESSION:   -create new HEP for balance, as the cervical HEP was discharged -continue to address cervical ROM/pain -address global weakness, deconditioning, and imbalance.   Renna Helling, SPT 3:43 PM, 03/05/24

## 2024-03-06 ENCOUNTER — Ambulatory Visit: Payer: Self-pay | Admitting: Family Medicine

## 2024-03-06 NOTE — Progress Notes (Signed)
 Cardiology Clinic Note   Date: 03/12/2024 ID: Shawn Lam, DOB [birth year], MRN [id number]  Primary Cardiologist:  Evalene Lunger, MD  Chief Complaint   Shawn Lam is a 73 y.o. male who presents to the clinic today for routine follow up.   Patient Profile   Shawn Lam is followed by Dr. Gollan for the history outlined below.       Past medical history significant for: CAD. LHC 02/01/2005: Proximal LAD 85%.  Nonobstructive CAD RCA.  PCI with BMS 3.5 x 12 mm to proximal LAD. LHC 06/17/2005 (abnormal stress test): Widely patent LAD stent.  Mid RCA 70%.  PCI with DES 4 x 15 mm to mid RCA. LHC 09/06/2013 (angina): Widely patent LAD and RCA stents.  25 to 30% in-stent restenosis distal margin of LAD stent.  Recommendation for medical therapy. Nuclear stress test 09/22/2021: Low risk study with no evidence of ischemia. PVCs. 14-day ZIO 08/26/2021: HR 40 to 193 bpm, average 68 bpm.  11 runs of SVT fastest 4 beats max rate 293 bpm, longest 15 beats average rate 111 bpm.  Frequent PVCs 8.4% burden.  Triggered events associated with PVCs. Carotid artery stenosis. Carotid duplex 11/13/2023: Bilateral ICA 1 to 39%.  Nonhemodynamically significant plaque bilateral CCA.  Bilateral ECA <50%. Hyperlipidemia. OSA. Inspire device. GERD. Dysphagia. S/p PEG tube 01/18/2024. Hypothyroidism. CKD stage III. Syncope/orthostatic hypotension. Echo 02/27/2024: EF 50 to 55%.  No RWMA.  Grade I DD.  Normal global strain.  RV function, mild RVH.  Mild MR.  No atrial level shunt detected by color-flow.  In summary, patient with history of CAD s/p PCI with BMS to proximal LAD in October 2006 and DES to mid RCA March 2007.  Patient underwent cardiac MRI in July 2015 which demonstrated normal LVEF and RVEF with no evidence of LGE.  He underwent loop recorder implantation in February 2018 for history of recurrent syncope.  Device interrogations through 2021 showed no evidence of significant arrhythmias,  prolonged pauses, or high-grade AV block.  He has a long history of syncopal events thought to be related to orthostatic hypotension.  Throughout the years he has been on Florinef  and midodrine .  He was seen in the clinic in April 2023 for evaluation of recurrent syncope in the context of positional dizziness.  He underwent repeat Zio patch monitoring which demonstrated predominant rhythm was sinus with an average rate of 68 bpm, runs of SVT and frequent PVCs.  Echo at that time demonstrated EF of 50%.  He was evaluated by EP in May 2023 with recommendation for conservative approach given burden <10%.  Lexiscan  in June 2023 showed no evidence of ischemia and was a low risk study.  He was seen in clinic in March 2024 and noted to have had several falls in the past from orthostatic hypotension with last fall being in January 2024.  He was encouraged to continue midodrine  3 times a day.  Palpitations were occasional.  No medication changes for patient and no further testing was ordered.  He was evaluated in the ED in December 2024 with complaint of headache.  Blood pressure was elevated at 153/101 and 169/98.  He contacted the office triage line with reports of elevated BP.  Midodrine  was decreased and weaned off.  Upon clinic follow-up in January 2025 he reported being started on losartan  by nephrology.  Around this time he was also restarted on metoprolol  for increased palpitations.  He continued to have  labile BP and was instructed to  monitor BP closely taking 50 mg of losartan  initially and additional 50 mg in the evenings if BP elevated.  Patient was last seen in the office by Dr. Gollan on 12/25/2023 for routine follow-up.  His weight was back down to 222 pounds.  He reported poor appetite.  He reported mild orthostatic symptoms and BP dropped 20 mmHg after standing for 3 minutes with mild symptoms.  It was recommended he reduce his losartan  to just once a day and continue Toprol .  Patient presented to the  ED on 01/22/2024 for witnessed fall with presyncopal symptoms prior.He was noted to be confused.  BP upon arrival 107/64, HR 88.  Patient was afebrile.  Initial labs: WBC 11.8, hemoglobin 11.3, hematocrit 36.1, sodium 138, potassium 4.9, BUN 40, creatinine 1.37, magnesium  2.1, respiratory panel negative.  Troponin negative x 2.  CT chest abdomen pelvis showed small to moderate volume of intraperitoneal free gas that was not unexpected secondary to gastrostomy tube placement 4 days prior, interval progression of patchy airspace disease in the right lower lobe and posterior left lower lobe compatible with multifocal pneumonia.  He was admitted for pneumonia and treated with IV antibiotics.  Patient was discharged on 01/24/2024 and instructed to stop losartan  and Toprol .  He was started on metoprolol  tartrate suspension 25 mg twice daily.  Patient contacted the office via MyChart with complaints of shortness of breath and hypotension.  Patient provided the following BP readings: 01/29/24 96/56..72 sitting, 01/30/24 108/64..65 after shower, 01/31/24 80/57.70 standing. Major breathlessness occurs when going from sitting to standing. Patient was provided with ED precautions and scheduled for office visit.   Patient was last seen in the office by me on 02/01/2024 for evaluation of hypotension.  He reported dyspnea upon standing with slight lightheadedness requiring him to stand for few minutes before it resolved.  He does not experience shortness of breath while seated.  He had been instructed by Dr. Gollan to stop metoprolol  1 night prior.  He was started on midodrine  5 mg 3 times daily as needed SBP <115.     History of Present Illness    Today, patient is here alone. He reports continued episodes of dyspnea with standing accompanied by drop in SPO2. He reports upon standing he feels like he is not getting in good air and has to deepen his breathing. This can last 2-3 minutes before resolving. He is then able  to perform normal activities without dyspnea. He has had extensive workup for this with pulmonology including a recent swallow study that was abnormal. He continues to utilize his PEG tube to avoid aspiration. He reports continued hypotension with BP in the low 90s systolic and associated lightheadedness. BP improves with midodrine . He is taking it 2 times a day. He denies chest pain, pressure or tightness. No lower extremity edema.     ROS: All other systems reviewed and are otherwise negative except as noted in History of Present Illness.  EKGs/Labs Reviewed        02/26/2024: BUN 48; Creatinine, Ser 1.46; Potassium 4.4; Sodium 137 03/05/2024: ALT 85; AST 76   02/26/2024: Hemoglobin 11.5; WBC 8.6   07/12/2023: TSH 1.25    Physical Exam    VS:  BP 112/70 (BP Location: Left Arm, Patient Position: Sitting, Cuff Size: Normal)   Pulse 85   Resp 17   Ht 6' (1.829 m)   Wt 121 lb (54.9 kg)   SpO2 (!) 88%   BMI 16.41 kg/m  , BMI Body mass index  is 16.41 kg/m.  GEN: Well nourished, well developed, in no acute distress. Neck: No JVD or carotid bruits. Cardiac:  RRR.  No murmur. No rubs or gallops.   Respiratory:  Respirations regular and unlabored. Clear to auscultation without rales, wheezing or rhonchi. GI: Soft, nontender, nondistended. Extremities: Radials/DP/PT 2+ and equal bilaterally. No clubbing or cyanosis. No edema   Skin: Warm and dry, no rash. Neuro: Strength intact.  Assessment & Plan   CAD S/p PCI with BMS to proximal LAD October 2006, DES to mid RCA March 2007. LHC May 2015 showed patent stents. Echo November 2025 demonstrated normal LV/RV function with grade I DD and mild MR.  No report of chest pain. He continues to have episodes of dyspnea upon standing with a drop in SPO2 that resolves after 2-3 minutes and then he is able to perform normal activities without dyspnea. Concern dyspnea could be anginal equivalent. Patient is in agreement to proceed with stress testing.   - Schedule PET stress.  - Continue aspirin , Plavix , atorvastatin , prn SL NTG.   Palpitations/PVCs  14-day ZIO May 2023 demonstrated frequent PVCs 8.4% burden.  Patient denies palpitations. RRR on exam today.  - Continue to monitor.    Orthostatic hypotension Patient reports continued hypotension with SBP in the low 90s. He is taking midodrine  twice a day with good results. Discussed increasing to 3 times a day dosing. BP today 112/70.  - Continue midodrine .  Disposition: Schedule PET stress. Return in 3 months or sooner as needed.      Informed Consent   Shared Decision Making/Informed Consent The risks [chest pain, shortness of breath, cardiac arrhythmias, dizziness, blood pressure fluctuations, myocardial infarction, stroke/transient ischemic attack, nausea, vomiting, allergic reaction, radiation exposure, metallic taste sensation and life-threatening complications (estimated to be 1 in 10,000)], benefits (risk stratification, diagnosing coronary artery disease, treatment guidance) and alternatives of a cardiac PET stress test were discussed in detail with Mr. Heinkel and he agrees to proceed.      Signed, Shawn Lam. Pranit Owensby, DNP, NP-C

## 2024-03-11 ENCOUNTER — Encounter: Payer: Self-pay | Admitting: Family Medicine

## 2024-03-12 ENCOUNTER — Encounter: Admitting: Speech Pathology

## 2024-03-12 ENCOUNTER — Ambulatory Visit: Attending: Student | Admitting: Student

## 2024-03-12 ENCOUNTER — Ambulatory Visit

## 2024-03-12 ENCOUNTER — Ambulatory Visit: Admitting: Physical Therapy

## 2024-03-12 ENCOUNTER — Encounter: Payer: Self-pay | Admitting: Student

## 2024-03-12 ENCOUNTER — Ambulatory Visit: Attending: Family Medicine | Admitting: Speech Pathology

## 2024-03-12 VITALS — BP 112/70 | HR 85 | Resp 17 | Ht 72.0 in | Wt 121.0 lb

## 2024-03-12 DIAGNOSIS — M542 Cervicalgia: Secondary | ICD-10-CM | POA: Diagnosis not present

## 2024-03-12 DIAGNOSIS — I2089 Other forms of angina pectoris: Secondary | ICD-10-CM | POA: Insufficient documentation

## 2024-03-12 DIAGNOSIS — I493 Ventricular premature depolarization: Secondary | ICD-10-CM | POA: Insufficient documentation

## 2024-03-12 DIAGNOSIS — I25118 Atherosclerotic heart disease of native coronary artery with other forms of angina pectoris: Secondary | ICD-10-CM | POA: Diagnosis not present

## 2024-03-12 DIAGNOSIS — M546 Pain in thoracic spine: Secondary | ICD-10-CM | POA: Diagnosis not present

## 2024-03-12 DIAGNOSIS — I951 Orthostatic hypotension: Secondary | ICD-10-CM | POA: Insufficient documentation

## 2024-03-12 DIAGNOSIS — R002 Palpitations: Secondary | ICD-10-CM | POA: Insufficient documentation

## 2024-03-12 DIAGNOSIS — R269 Unspecified abnormalities of gait and mobility: Secondary | ICD-10-CM | POA: Insufficient documentation

## 2024-03-12 DIAGNOSIS — R490 Dysphonia: Secondary | ICD-10-CM | POA: Insufficient documentation

## 2024-03-12 DIAGNOSIS — R1312 Dysphagia, oropharyngeal phase: Secondary | ICD-10-CM | POA: Diagnosis present

## 2024-03-12 DIAGNOSIS — M6281 Muscle weakness (generalized): Secondary | ICD-10-CM

## 2024-03-12 DIAGNOSIS — R2689 Other abnormalities of gait and mobility: Secondary | ICD-10-CM | POA: Insufficient documentation

## 2024-03-12 DIAGNOSIS — R079 Chest pain, unspecified: Secondary | ICD-10-CM | POA: Diagnosis present

## 2024-03-12 MED ORDER — NITROGLYCERIN 0.4 MG SL SUBL: 0.4000 mg | SUBLINGUAL_TABLET | SUBLINGUAL | 3 refills | Status: AC | PRN

## 2024-03-12 NOTE — Therapy (Signed)
 OUTPATIENT SPEECH LANGUAGE PATHOLOGY  SWALLOW TREATMENT     Patient Name: Shawn Lam MRN: [id number] DOB:06/20/1950, 73 y.o., male Today's Date: 03/12/2024  PCP: Greig Ring, MD REFERRING PROVIDER: Greig Ring, MD    End of Session - 03/12/24 0759     Visit Number 13    Number of Visits 25    Date for Recertification  03/25/24    Authorization Type Medicare Part A    Progress Note Due on Visit 20    SLP Start Time 0800    SLP Stop Time  0845    SLP Time Calculation (min) 45 min    Activity Tolerance Patient tolerated treatment well          Past Medical History:  Diagnosis Date   Actinic keratosis 02/04/2021   right lateral neck   Anemia    Anxiety    Asymptomatic LV dysfunction    50-55% by echo 06/2016   BCC (basal cell carcinoma of skin) 08/05/2020   left neck infra auricular - nodular pattern - treated with [facility] 09/17/2020   Berger's disease    CAD (coronary artery disease) 06/2005   PCI of LAD and RCA   Depression    Dilated aortic root    4cm at sinus of Valsalva   Hematuria    Hyperlipidemia    Hypothyroidism    Insomnia    Left ureteral stone    Orthostatic hypotension    negative tilt table   OSA (obstructive sleep apnea)    moderate with AHI 17/hr, uses CPAP nightly(01/15/19 - has Inspire implant)   PVC's (premature ventricular contractions) 07/07/2016   Renal disorder    PT IS SEEING DR. BETSEY- NEPHROLOGIST   Rosacea    Urticaria    Past Surgical History:  Procedure Laterality Date   ANTERIOR CERVICAL DECOMP/DISCECTOMY FUSION N/A 03/13/2023   Procedure: CERVICAL FOUR-FIVE, CERVICAL FIVE-SIX ANTERIOR CERVICAL DISCECTOMY/DECOMPRESSION FUSION;  Surgeon: Onetha Kuba, MD;  Location: [facility] OR;  Service: Neurosurgery;  Laterality: N/A;  3C   CARDIAC CATHETERIZATION  09/06/2013   [facility]   CARDIAC CATHETERIZATION     COLONOSCOPY WITH PROPOFOL  N/A 01/21/2019   Procedure: COLONOSCOPY WITH PROPOFOL ;  Surgeon: Jinny Carmine, MD;  Location: [facility]  CN[facility];  Service: Endoscopy;  Laterality: N/A;  sleep apnea   COLONOSCOPY WITH PROPOFOL  N/A 10/19/2021   Procedure: COLONOSCOPY WITH PROPOFOL ;  Surgeon: Jinny Carmine, MD;  Location: [facility] ENDOSCOPY;  Service: Endoscopy;  Laterality: N/A;   CORONARY ANGIOPLASTY  2004   STENT PLACEMENT   CYSTOSCOPY WITH RETROGRADE PYELOGRAM, URETEROSCOPY AND STENT PLACEMENT Left 03/19/2014   Procedure: CYSTOSCOPY WITH RETROGRADE PYELOGRAM, URETEROSCOPY AND STENT PLACEMENT;  Surgeon: Mickey Ricardo KATHEE Alvaro, MD;  Location: [facility] ORS;  Service: Urology;  Laterality: Left;   CYSTOSCOPY WITH RETROGRADE PYELOGRAM, URETEROSCOPY AND STENT PLACEMENT Bilateral 05/20/2015   Procedure:  CYSTOSCOPY WITH BILATERAL RETROGRADE PYELOGRAM,RIGHT  DIAGNOSTIC URETEROSCOPY ,LEFT URETEROSCOPY WITH HOLMIUM LASER  AND BILATERAL  STENT PLACEMENT ;  Surgeon: Ricardo Alvaro, MD;  Location: [facility] ORS;  Service: Urology;  Laterality: Bilateral;   DRUG INDUCED ENDOSCOPY N/A 10/20/2017   Procedure: DRUG INDUCED SLEEP ENDOSCOPY;  Surgeon: Carlie Clark, MD;  Location: [facility];  Service: ENT;  Laterality: N/A;   EP IMPLANTABLE DEVICE N/A 05/13/2016   Procedure: Loop Recorder Insertion;  Surgeon: Elspeth JAYSON Sage, MD;  Location: [facility] INVASIVE CV LAB;  Service: Cardiovascular;  Laterality: N/A;   ESOPHAGOGASTRODUODENOSCOPY N/A 10/19/2021   Procedure: ESOPHAGOGASTRODUODENOSCOPY (EGD);  Surgeon: Jinny Carmine, MD;  Location: [facility] ENDOSCOPY;  Service: Endoscopy;  Laterality: N/A;   ESOPHAGOGASTRODUODENOSCOPY (EGD) WITH PROPOFOL  N/A 11/30/2018   Procedure: ESOPHAGOGASTRODUODENOSCOPY (EGD) WITH PROPOFOL ;  Surgeon: Therisa Bi, MD;  Location: [facility] ENDOSCOPY;  Service: Gastroenterology;  Laterality: N/A;   ESOPHAGOGASTRODUODENOSCOPY (EGD) WITH PROPOFOL  N/A 12/01/2018   Procedure: ESOPHAGOGASTRODUODENOSCOPY (EGD) WITH PROPOFOL ;  Surgeon: Jinny Carmine, MD;  Location: [facility] ENDOSCOPY;  Service: Endoscopy;  Laterality: N/A;   HOLMIUM LASER APPLICATION Bilateral 05/20/2015    Procedure: HOLMIUM LASER APPLICATION;  Surgeon: Ricardo Likens, MD;  Location: [facility] ORS;  Service: Urology;  Laterality: Bilateral;   IMPLIMENTATION OF A HYPOGLOSSAL NERVE STIMULATOR  12/08/2017   OSA    IR GASTROSTOMY TUBE MOD SED  01/18/2024   IR RADIOLOGIST EVAL & MGMT  02/13/2024   LEFT HEART CATHETERIZATION WITH CORONARY ANGIOGRAM N/A 09/06/2013   Procedure: LEFT HEART CATHETERIZATION WITH CORONARY ANGIOGRAM;  Surgeon: Victory LELON Claudene DOUGLAS, MD;  Location: [facility] CATH LAB;  Service: Cardiovascular;  Laterality: N/A;   LEFT KNEE CAP  SURGERY ABOUT 40 YRS AGO  ? 1970     STONE EXTRACTION WITH BASKET Left 03/19/2014   Procedure: STONE EXTRACTION WITH BASKET;  Surgeon: Mickey Ricardo KATHEE Likens, MD;  Location: [facility] ORS;  Service: Urology;  Laterality: Left;   TRANSURETHRAL RESECTION OF PROSTATE N/A 08/25/2021   Procedure: TRANSURETHRAL RESECTION OF THE PROSTATE (TURP);  Surgeon: Likens Ricardo, MD;  Location: [facility] ORS;  Service: Urology;  Laterality: N/A;  1 HR   Patient Active Problem List   Diagnosis Date Noted   S/P gastrostomy tube (G tube) placement, follow-up exam 02/18/2024   Skin infection at gastrostomy tube site (HCC) 02/06/2024   Protein-calorie malnutrition, severe 01/23/2024   Multifocal pneumonia 01/22/2024   Fall 01/03/2024   Hematoma of right hip 01/03/2024   Chronic pulmonary aspiration 12/29/2023   Silent aspiration 12/29/2023   Moderate protein-calorie malnutrition (weight for age 57-74% of standard) 12/29/2023   Bilateral leg weakness 12/29/2023   Chronic respiratory failure with hypoxia, on home oxygen  therapy (HCC) 12/29/2023   Chronic constipation 12/29/2023   Somnolence 12/29/2023   Frequent falls 12/29/2023   Feeding difficulties, unspecified 12/20/2023   Shortness of breath 12/20/2023   Abnormal CT of the chest 10/10/2023   Elevated blood sugar 06/30/2023   Carotid stenosis 05/11/2023   Spinal stenosis in cervical region 03/13/2023   Osteoarthritis of spine with  radiculopathy, cervical region 07/14/2022   Cervicogenic headache 11/30/2021   Chronic neck pain 11/30/2021   Weight loss, non-intentional    Dysphagia    Polyp of colon    Prostatic hyperplasia 08/25/2021   Chronic insomnia 03/19/2020   Peripheral neuropathy 03/19/2020   Iron deficiency anemia 01/06/2020   Allergic dermatitis 07/08/2019   Osteoarthritis of left knee 02/26/2019   History of colonic polyps    History of duodenal ulcer    CKD (chronic kidney disease), stage IIIa    Bilateral chronic knee pain 11/09/2018   Epistaxis 10/02/2018   Subjective tinnitus, bilateral 10/02/2018   Chronic pain of right thumb 02/22/2018   Acute bilateral thoracic back pain 01/23/2018   Osteoporosis without current pathological fracture 02/10/2017   Hypothyroidism 11/02/2016   Thoracic compression fracture (HCC) 09/06/2016   PVC's (premature ventricular contractions) 07/07/2016   Hypoalbuminemia 06/28/2016   IgA nephropathy 06/07/2016   Acute buttock pain 05/23/2016   MDD (major depressive disorder), recurrent episode, moderate (HCC) 11/20/2015   OSA (obstructive sleep apnea) 02/05/2014   Orthostatic hypotension 02/07/2013   Scotoma involving central area 01/29/2013   Optic  neuropathy 01/28/2013   HEMATURIA UNSPECIFIED 01/21/2010   HLD (hyperlipidemia) 07/05/2006   Generalized anxiety disorder 07/05/2006   CAD (coronary artery disease) 07/05/2006   GERD 07/05/2006    ONSET DATE: 03/2023 following ACDF; Date of referral 12/28/2023: date of this referral 02/01/2024  REFERRING DIAG:  T17.908D (ICD-10-CM) - Chronic pulmonary aspiration, subsequent encounter  T17.900D (ICD-10-CM) - Silent aspiration, subsequent encounter  E44.0 (ICD-10-CM) - Moderate protein-calorie malnutrition  R13.10 (ICD-10-CM) - Dysphagia, unspecified type    THERAPY DIAG:  Oropharyngeal dysphagia  Rationale for Evaluation and Treatment Rehabilitation  SUBJECTIVE:   PERTINENT HISTORY and DIAGNOSTIC FINDINGS:   Pt is a 73 yo male w/ ACDF surgery 03/2023.  PMH: GERD(Not on a PPI per chart), Actinic keratosis (02/04/2021), Anemia, Anxiety, Asymptomatic LV dysfunction, BCC (basal cell carcinoma of skin) (08/05/2020), Berger's disease, CAD (coronary artery disease) (06/2005), resting tremor, Depression, Dilated aortic root (HCC), Hematuria, Hyperlipidemia, Hypothyroidism, Insomnia, Left ureteral stone, Orthostatic hypotension, OSA (obstructive sleep apnea), PVC's (premature ventricular contractions) (07/07/2016), Renal disorder, Rosacea, and Urticaria.  Pt is followed by Cardiology for Coronary Artery Stenosis currently; also followed for Migraines.     EGD in 2023 revealed Gastritis.      CT of Chest 10/2023: 1. Interim development of patchy consolidation and ground-glass   disease most evident at the right middle lobe and right lower lobes   with additional areas of mild ground-glass disease and scattered   clustered nodularity, findings are suspicious for multifocal   infection/pneumonia. Imaging follow-up to resolution is recommended.   2. Emphysema.   3. Aortic atherosclerosis.            Dysphagia - reported by pt as Chronic, ever since neck surgery in December 2024. Pt was seen in the emergency room s/p Fall, notes detailing that he slipped on soap in the shower, fell backwards on right side hit head and neck against shower seat, possible brief loss of consciousness, per chart.  ACDF 03/2023 per chart.  Since the neck surgery in 03/2023, he has noted food, liquids and pill getting stuck on the right side of my throat more.  I used to cough a lot more but not as much now.  I cough up what gets stuck..  He has lost significant weight per chart notes due to decreased eating.  Pt endorsed using his home O2 when he feels SOB.   MBSS 12/26/2023 Patient appears to present w/ Chronic, moderate-severe Pharyngeal phase dysphagia per this assessment today. It appeared there was some impact from changes in the  Cervical Esophagus s/p ACDF surgery(03/2023 per chart notes) including structural/tissue changes in/aournd the CP segment and cervical esophagus. Pt with intermittent silent aspiration of thin liquids.   Recommend continue current diet of easy to eat foods at home; thin liquids via cup- at this time. Oral care Before any oral intake. Swallow Strategies Recommended: No straws, moistened foods, SMALL bites/sips, Head Turn RIGHT w/ slight chin tuck, multiple swallows to aid clearing, AND strong Cough/throat clear b/t bites/sips and at end of meal.  D/t the Chronicity of pt's Pharyngeal phase Dysphagia w/ weight loss and pulmonary impact per recent Chest CT, pt and Family may need to discuss alternative means of nutrition/hydration as support w/ his Medical Team/PCP, while seeking swallow therapy.  Received ENT report dated 01/12/2024 Laryngoscopy revealed  Moderate vocal fold bowing bilaterally and moderate glottic gamp with phonation Impression/plan: Aspiration on MBSS and significant weight loss since ACDF last year. TVF shows moderate sized glottic gap on laryngoscoy but mobile  vocal folds bilaterally. Pooling in pyriform on MBSS but no significant pooling of secretions on laryngoscopy. I feel that between speech therpay and possibly bilateral injection laryngoplasty, hopefully his swallowing will significantly improve. Will make referral to Dr Brien at [facility].   Pt and his wife report appt with Dr Brien in January 2026.  10/10/205 PEG placed  01/22/2024 thru 01/24/2024 Hospitalized d/t fall and confusion CT of the chest on arrival showed multifocal pneumonia and possible free air. When reviewed further with interventional radiology team it was determined this was an appropriate amount of free air secondary to recent PEG tube placement. Patient was initiated on antibiotic therapy for pneumonia and improved significantly.   02/07/2024 CT Chest 1. Evolving multifocal inflammatory  infiltrates, progressive within the right upper lobe and improved within the lung bases bilaterally. Recommend appropriate clinical management and imaging follow-up per clinical course. 2. Mild emphysema. Given emphysema as an independent lung cancer risk factor, consider evaluation for a low-dose CT lung cancer screening program if the patient is 73 years old and otherwise eligible.   PAIN:  Are you having pain? No  FALLS: Has patient fallen in last 6 months?  See PT evaluation for details  LIVING ENVIRONMENT: Lives with: lives with their spouse Lives in: House/apartment  PLOF:  Level of assistance: Independent with ADLs, Independent with IADLs Employment: Retired   PATIENT GOALS  to improve swallow function, nutrition and hydration, decrease aspiration risks  SUBJECTIVE STATEMENT: Pt pleasant, reports wife is sick Pt accompanied by: self   OBJECTIVE:   TODAY'S TREATMENT:  Skilled treatment session focused on pt's dysphagia goals. SLP facilitated the session by providing the following interventions:   Respiratory Muscle Strengthening: Pt independently able to maneuver devices correctly. EMST 75 set to 35 cm H2O: Pt unable to complete, resistance decreased to 30 cm H2O. Pt able to complete three sets of 8 reps independently.  IMST set to 30 cm H2O: Pt able to complete 3 sets of 8 reps independently.  Pharyngeal exercises: Trial set 1: Skilled observation of pt consuming 6 spoonfuls of water . Pt observed with throat clear on all 6 spoonfuls.  [place]: Pt able to complete 3 sets of 8 reps independently.  Trial set 2: Skilled observation of pt consuming 4 spoonfuls of water . Pt observed with throat clear on all 4 spoonfuls and intermittent double swallow. Pt with cough on 1 spoonful.  Head Extension Swallowing Exercise - Pt able to complete 3 sets of 8 repetitions with min A. Pt observed with throat clear and cough on 3rd set. CTAR: Pt able to independently complete 3 sets  of 8 reps. Pt observed with one instance of cough and throat clear.    PATIENT EDUCATION: Education details: ST POC Person educated: Patient and Spouse Education method: Chief Technology Officer Education comprehension: needs further education  HOME EXERCISE PROGRAM:     See above   GOALS: Goals reviewed with patient? Yes  SHORT TERM GOALS: Target date: 10 sessions  Updated 02/28/2024  Updated 02/09/2024 With Mod I, pt will perform compensatory swallow strategies (chin tuck, multiple swallows, head turn) to eliminate s/s of aspiration when consuming least restrictive diet. Baseline: Goal status: INITIAL: moderate A: Progress made, fluctuating between supervision and independent cues  2.  With Min A, pt will complete (supraglottic, swallow, Mednelson maneuver, effortful swallow) to improve oral motor weakness, tongue base retraction, hyolarnygeal excursion, airway protection and/or clearance of the bolus through the pharynx. Baseline:  Goal status: INITIAL: progress made; moderate A: Progress  made, fluctuating between supervision and independent cues  3. Pt will demonstrate overt s/s of aspiration when consuming ice chips.   Baseline: Goal status: INITIAL: no overt s/s of aspiration observed during today's session: Intermittent overt s/s observed   LONG TERM GOALS: Target date: 03/25/2024  Updated 02/28/2024  Updated: 02/09/2024 With Mod I, pt will demonstrate the ability to adequately self-monitor swallowing skills and perform appropriate compensatory techniques to reduce s/s of aspiration and to safely consume least restrictive diet.     Baseline:  Goal status: INITIAL: continues to be appropriate goal  2.  Pt will participate in repeat instrumental swallow evaluation to assess least restrictive diet.  Baseline:  Goal status: INITIAL: continues to be appropriate goal: Modified Barium scheduled for 11/21   ASSESSMENT:  CLINICAL IMPRESSION: Patient is a 73 y.o. male who  was seen today for a clinical swallow re-evaluation d/t severe pharyngeal phase dysphagia following ACDF (03/2023).    See the above treatment note for details.   OBJECTIVE IMPAIRMENTS include dysphagia. These impairments are limiting patient from safety when swallowing. Factors affecting potential to achieve goals and functional outcome are severity of impairments and malnutrition. Patient will benefit from skilled SLP services to address above impairments and improve overall function.  REHAB POTENTIAL: Good  PLAN: SLP FREQUENCY: 1-2x/week  SLP DURATION: 12 weeks  PLANNED INTERVENTIONS: Aspiration precaution training, Pharyngeal strengthening exercises, Diet toleration management , Oral motor exercises, SLP instruction and feedback, Compensatory strategies, and Patient/family education   Happi B. Rubbie, M.S., CCC-SLP, Tree Surgeon Certified Brain Injury Specialist [facility]  [facility] Office [phone] Ascom [phone] Fax [phone]

## 2024-03-12 NOTE — Patient Instructions (Signed)
 Medication Instructions:   Your physician recommends that you continue on your current medications as directed. Please refer to the Current Medication list given to you today.    *If you need a refill on your cardiac medications before your next appointment, please call your pharmacy*  Lab Work:  None ordered at this time   If you have labs (blood work) drawn today and your tests are completely normal, you will receive your results only by:  MyChart Message (if you have MyChart) OR  A paper copy in the mail If you have any lab test that is abnormal or we need to change your treatment, we will call you to review the results.  Testing/Procedures:     Please report to Radiology at the [facility] Main Entrance 30 minutes early for your test.  [address] [place], [place] [address]                         OR   Please report to Radiology at [facility] Main Entrance, medical mall, 30 mins prior to your test.  [address]  [place], [place]  How to Prepare for Your Cardiac PET/CT Stress Test:  Nothing to eat or drink, except water , 3 hours prior to arrival time.  NO caffeine/decaffeinated products, or chocolate 12 hours prior to arrival. (Please note decaffeinated beverages (teas/coffees) still contain caffeine).  If you have caffeine within 12 hours prior, the test will need to be rescheduled.  Medication instructions: Do not take erectile dysfunction medications for 72 hours prior to test (sildenafil, tadalafil) Do not take nitrates (isosorbide mononitrate, Ranexa) the day before or day of test Do not take tamsulosin  the day before or morning of test Hold theophylline containing medications for 12 hours. Hold Dipyridamole 48 hours prior to the test.  Diabetic Preparation: If able to eat breakfast prior to 3 hour fasting, you may take all medications, including your insulin. Do not worry if you miss your breakfast dose of insulin -  start at your next meal. If you do not eat prior to 3 hour fast-Hold all diabetes (oral and insulin) medications. Patients who wear a continuous glucose monitor MUST remove the device prior to scanning.  You may take your remaining medications with water .  NO perfume, cologne or lotion on chest or abdomen area. FEMALES - Please avoid wearing dresses to this appointment.  Total time is 1 to 2 hours; you may want to bring reading material for the waiting time.  IF YOU THINK YOU MAY BE PREGNANT, OR ARE NURSING PLEASE INFORM THE TECHNOLOGIST.  In preparation for your appointment, medication and supplies will be purchased.  Appointment availability is limited, so if you need to cancel or reschedule, please call the Radiology Department Scheduler at [phone] 24 hours in advance to avoid a cancellation fee of $100.00  What to Expect When you Arrive:  Once you arrive and check in for your appointment, you will be taken to a preparation room within the Radiology Department.  A technologist or Nurse will obtain your medical history, verify that you are correctly prepped for the exam, and explain the procedure.  Afterwards, an IV will be started in your arm and electrodes will be placed on your skin for EKG monitoring during the stress portion of the exam. Then you will be escorted to the PET/CT scanner.  There, staff will get you positioned on the scanner and obtain a blood pressure  and EKG.  During the exam, you will continue to be connected to the EKG and blood pressure machines.  A small, safe amount of a radioactive tracer will be injected in your IV to obtain a series of pictures of your heart along with an injection of a stress agent.    After your Exam:  It is recommended that you eat a meal and drink a caffeinated beverage to counter act any effects of the stress agent.  Drink plenty of fluids for the remainder of the day and urinate frequently for the first couple of hours after the exam.   Your doctor will inform you of your test results within 7-10 business days.  For more information and frequently asked questions, please visit our website: [email or web address]  For questions about your test or how to prepare for your test, please call: Cardiac Imaging Nurse Navigators Office: [phone]   Referrals:  None ordered at this time   Follow-Up:  At [facility], you and your health needs are our priority.  As part of our continuing mission to provide you with exceptional heart care, our providers are all part of one team.  This team includes your primary Cardiologist (physician) and Advanced Practice Providers or APPs (Physician Assistants and Nurse Practitioners) who all work together to provide you with the care you need, when you need it.  Your next appointment:   3 month(s)  Provider:    Barnie Hila, NP    We recommend signing up for the patient portal called MyChart.  Sign up information is provided on this After Visit Summary.  MyChart is used to connect with patients for Virtual Visits (Telemedicine).  Patients are able to view lab/test results, encounter notes, upcoming appointments, etc.  Non-urgent messages can be sent to your provider as well.   To learn more about what you can do with MyChart, go to [email or web address].

## 2024-03-12 NOTE — Therapy (Signed)
 OUTPATIENT PHYSICAL THERAPY TREATMENT   Patient Name: Shawn Lam MRN: [id number] DOB:12-Mar-1951, 73 y.o., male Today's Date: 03/12/2024  END OF SESSION:  PT End of Session - 03/12/24 0803     Visit Number 12    Number of Visits 27    Date for Recertification  04/30/24    Progress Note Due on Visit 10    PT Start Time 0845    PT Stop Time 0928    PT Time Calculation (min) 43 min    Equipment Utilized During Treatment Gait belt    Activity Tolerance Patient tolerated treatment well    Behavior During Therapy [facility] for tasks assessed/performed           Past Medical History:  Diagnosis Date   Actinic keratosis 02/04/2021   right lateral neck   Anemia    Anxiety    Asymptomatic LV dysfunction    50-55% by echo 06/2016   BCC (basal cell carcinoma of skin) 08/05/2020   left neck infra auricular - nodular pattern - treated with [facility] 09/17/2020   Berger's disease    CAD (coronary artery disease) 06/2005   PCI of LAD and RCA   Depression    Dilated aortic root    4cm at sinus of Valsalva   Hematuria    Hyperlipidemia    Hypothyroidism    Insomnia    Left ureteral stone    Orthostatic hypotension    negative tilt table   OSA (obstructive sleep apnea)    moderate with AHI 17/hr, uses CPAP nightly(01/15/19 - has Inspire implant)   PVC's (premature ventricular contractions) 07/07/2016   Renal disorder    PT IS SEEING DR. BETSEY- NEPHROLOGIST   Rosacea    Urticaria    Past Surgical History:  Procedure Laterality Date   ANTERIOR CERVICAL DECOMP/DISCECTOMY FUSION N/A 03/13/2023   Procedure: CERVICAL FOUR-FIVE, CERVICAL FIVE-SIX ANTERIOR CERVICAL DISCECTOMY/DECOMPRESSION FUSION;  Surgeon: Onetha Kuba, MD;  Location: [facility] OR;  Service: Neurosurgery;  Laterality: N/A;  3C   CARDIAC CATHETERIZATION  09/06/2013   [facility]   CARDIAC CATHETERIZATION     COLONOSCOPY WITH PROPOFOL  N/A 01/21/2019   Procedure: COLONOSCOPY WITH PROPOFOL ;  Surgeon: Jinny Carmine, MD;  Location: [facility]  SURGERY CNTR;  Service: Endoscopy;  Laterality: N/A;  sleep apnea   COLONOSCOPY WITH PROPOFOL  N/A 10/19/2021   Procedure: COLONOSCOPY WITH PROPOFOL ;  Surgeon: Jinny Carmine, MD;  Location: [facility] ENDOSCOPY;  Service: Endoscopy;  Laterality: N/A;   CORONARY ANGIOPLASTY  2004   STENT PLACEMENT   CYSTOSCOPY WITH RETROGRADE PYELOGRAM, URETEROSCOPY AND STENT PLACEMENT Left 03/19/2014   Procedure: CYSTOSCOPY WITH RETROGRADE PYELOGRAM, URETEROSCOPY AND STENT PLACEMENT;  Surgeon: Mickey Ricardo KATHEE Alvaro, MD;  Location: [facility] ORS;  Service: Urology;  Laterality: Left;   CYSTOSCOPY WITH RETROGRADE PYELOGRAM, URETEROSCOPY AND STENT PLACEMENT Bilateral 05/20/2015   Procedure:  CYSTOSCOPY WITH BILATERAL RETROGRADE PYELOGRAM,RIGHT  DIAGNOSTIC URETEROSCOPY ,LEFT URETEROSCOPY WITH HOLMIUM LASER  AND BILATERAL  STENT PLACEMENT ;  Surgeon: Ricardo Alvaro, MD;  Location: [facility] ORS;  Service: Urology;  Laterality: Bilateral;   DRUG INDUCED ENDOSCOPY N/A 10/20/2017   Procedure: DRUG INDUCED SLEEP ENDOSCOPY;  Surgeon: Carlie Clark, MD;  Location: [facility];  Service: ENT;  Laterality: N/A;   EP IMPLANTABLE DEVICE N/A 05/13/2016   Procedure: Loop Recorder Insertion;  Surgeon: Elspeth JAYSON Sage, MD;  Location: [facility] INVASIVE CV LAB;  Service: Cardiovascular;  Laterality: N/A;   ESOPHAGOGASTRODUODENOSCOPY N/A 10/19/2021   Procedure: ESOPHAGOGASTRODUODENOSCOPY (EGD);  Surgeon: Jinny Carmine, MD;  Location:  [facility] ENDOSCOPY;  Service: Endoscopy;  Laterality: N/A;   ESOPHAGOGASTRODUODENOSCOPY (EGD) WITH PROPOFOL  N/A 11/30/2018   Procedure: ESOPHAGOGASTRODUODENOSCOPY (EGD) WITH PROPOFOL ;  Surgeon: Therisa Bi, MD;  Location: [facility] ENDOSCOPY;  Service: Gastroenterology;  Laterality: N/A;   ESOPHAGOGASTRODUODENOSCOPY (EGD) WITH PROPOFOL  N/A 12/01/2018   Procedure: ESOPHAGOGASTRODUODENOSCOPY (EGD) WITH PROPOFOL ;  Surgeon: Jinny Carmine, MD;  Location: [facility] ENDOSCOPY;  Service: Endoscopy;  Laterality: N/A;   HOLMIUM LASER APPLICATION Bilateral  05/20/2015   Procedure: HOLMIUM LASER APPLICATION;  Surgeon: Ricardo Likens, MD;  Location: [facility];  Service: Urology;  Laterality: Bilateral;   IMPLIMENTATION OF A HYPOGLOSSAL NERVE STIMULATOR  12/08/2017   OSA    IR GASTROSTOMY TUBE MOD SED  01/18/2024   IR RADIOLOGIST EVAL & MGMT  02/13/2024   LEFT HEART CATHETERIZATION WITH CORONARY ANGIOGRAM N/A 09/06/2013   Procedure: LEFT HEART CATHETERIZATION WITH CORONARY ANGIOGRAM;  Surgeon: Victory LELON Claudene DOUGLAS, MD;  Location: [facility] CATH LAB;  Service: Cardiovascular;  Laterality: N/A;   LEFT KNEE CAP  SURGERY ABOUT 40 YRS AGO  ? 1970     STONE EXTRACTION WITH BASKET Left 03/19/2014   Procedure: STONE EXTRACTION WITH BASKET;  Surgeon: Mickey Ricardo KATHEE Likens, MD;  Location: [facility]S;  Service: Urology;  Laterality: Left;   TRANSURETHRAL RESECTION OF PROSTATE N/A 08/25/2021   Procedure: TRANSURETHRAL RESECTION OF THE PROSTATE (TURP);  Surgeon: Likens Ricardo, MD;  Location: [facility] OR[facility];  Service: Urology;  Laterality: N/A;  1 HR   Patient Active Problem List   Diagnosis Date Noted   S/P gastrostomy tube (G tube) placement, follow-up exam 02/18/2024   Skin infection at gastrostomy tube site (HCC) 02/06/2024   Protein-calorie malnutrition, severe 01/23/2024   Multifocal pneumonia 01/22/2024   Fall 01/03/2024   Hematoma of right hip 01/03/2024   Chronic pulmonary aspiration 12/29/2023   Silent aspiration 12/29/2023   Moderate protein-calorie malnutrition (weight for age 54-74% of standard) 12/29/2023   Bilateral leg weakness 12/29/2023   Chronic respiratory failure with hypoxia, on home oxygen  therapy (HCC) 12/29/2023   Chronic constipation 12/29/2023   Somnolence 12/29/2023   Frequent falls 12/29/2023   Feeding difficulties, unspecified 12/20/2023   Shortness of breath 12/20/2023   Abnormal CT of the chest 10/10/2023   Elevated blood sugar 06/30/2023   Carotid stenosis 05/11/2023   Spinal stenosis in cervical region 03/13/2023   Osteoarthritis of spine with  radiculopathy, cervical region 07/14/2022   Cervicogenic headache 11/30/2021   Chronic neck pain 11/30/2021   Weight loss, non-intentional    Dysphagia    Polyp of colon    Prostatic hyperplasia 08/25/2021   Chronic insomnia 03/19/2020   Peripheral neuropathy 03/19/2020   Iron deficiency anemia 01/06/2020   Allergic dermatitis 07/08/2019   Osteoarthritis of left knee 02/26/2019   History of colonic polyps    History of duodenal ulcer    CKD (chronic kidney disease), stage IIIa    Bilateral chronic knee pain 11/09/2018   Epistaxis 10/02/2018   Subjective tinnitus, bilateral 10/02/2018   Chronic pain of right thumb 02/22/2018   Acute bilateral thoracic back pain 01/23/2018   Osteoporosis without current pathological fracture 02/10/2017   Hypothyroidism 11/02/2016   Thoracic compression fracture (HCC) 09/06/2016   PVC's (premature ventricular contractions) 07/07/2016   Hypoalbuminemia 06/28/2016   IgA nephropathy 06/07/2016   Acute buttock pain 05/23/2016   MDD (major depressive disorder), recurrent episode, moderate (HCC) 11/20/2015   OSA (obstructive sleep apnea) 02/05/2014   Orthostatic hypotension 02/07/2013   Scotoma involving central area 01/29/2013   Optic neuropathy  01/28/2013   HEMATURIA UNSPECIFIED 01/21/2010   HLD (hyperlipidemia) 07/05/2006   Generalized anxiety disorder 07/05/2006   CAD (coronary artery disease) 07/05/2006   GERD 07/05/2006    PCP: Dr. Greig Ring    REFERRING PROVIDER: Dr. Arley Helling   REFERRING DIAG: M54.6 (ICD-10-CM) - Acute bilateral thoracic back pain  THERAPY DIAG:  Abnormality of gait  Muscle weakness (generalized)  Balance disorder  Rationale for Evaluation and Treatment: Rehabilitation  ONSET DATE: May 2025    SUBJECTIVE:                                                                                                                                                                                                          SUBJECTIVE STATEMENT  Pt reports he is doing well, no updates and had a good thanksgiving. Still having issues with his balance and mobility. Still having neck discomfort, was supposed to see MD today but MD had to reschedule due to an emergency surgery.   PERTINENT HISTORY:   73yoM referred to physical therapy for ongoing neck pain following cervical fusion in May 2020 and newly developed thoracic pain from T12 compression fracture that occurred sometime in Spring of 2025 (associated radicular pain pattern). Pt has pain localized to upper traps and suboccipitals. Pt attempted DC of prednisone  and gabapentin  two weeks prior to evaluation here, severe decline in function due to pain, barely able to walk for two weeks. He describes feeling increased fatigue and weakness to the point where he struggled to walk. Pt also reported repeated falls in the setting of orthostatic hypotension with him experiencing syncope when he stands up from a seated position. He describes feeling a burning sensation that is also localized to his scapulae and that it occurs when he is doing UE activities like washing his car that is likely the radicular pain from thoracic vertebrae compression fracture.  PAIN: Are you having pain? 03/05/2024: 6/10 today in the neck  PRECAUTIONS: None Pt has thoracic compression fractures and is s/p cervical spine surgery   WEIGHT BEARING RESTRICTIONS: No  FALLS:  Has patient fallen in last 6 months? Yes. Number of falls 6, pt reports that falls are happening in the context of syncope when standing up from a seated position (Orthostatic Hypotension) , where he blacks out and falls. He is following up with his PCP, Dr. Karlene who is working with him on stabilizing his blood pressure and she has recently taken him off Gabapentin  to see if this helps. Pt reports that he does note an improvement in his symptoms  with less light headedness since going off medication.   LIVING  ENVIRONMENT: Lives with: lives with their spouse Lives in: House/apartment Stairs: No Has following equipment at home: Single point cane and Environmental Consultant - 4 wheeled  OCCUPATION: Retired     PLOF: Independent  PATIENT GOALS: Improve balance, decrease falls.       OBJECTIVE:   Note: Objective measures were completed at evaluation unless otherwise noted. Reassessment Measures 02/09/24: -eyes closed balance, normal stance: <5sec with retropulsion each time, ModA falls recovery  -narrow stance eyes open balance: <10sec with LOB, ModA falls recovery  -Long distance AMB assessment c SPC: 170ft with 2 DOE standing breaks ~15 seconds each, 15sec; crossover gait intermittently without LOB; -standing vitals taken at end of walk: stable and comparable to prior seated vitals.  -cervical rotation ROM: 50deg Rt, 53 degree left  -shoulder flexion MMT: 5/5 Rt, 4+/5 left; shoulder ABDCT MMT: 5/5 bilat  -ankle DF assessment: can perform >10x full range while leaning against wall (adequate for gait needs)  -sensory testing about ankles, lower legs: (feels intact, but pt reports constance N/T below ankle to his toes)  Standing BP assessment: 02/28/24 -124/79mmHg 95bpm standing @ 0 -122/13mmHg 90bpm standing @ 1 110/10mmHg 92bpm standing @ 2  TREATMENT DATE 03/12/24:   TA- To improve functional movements patterns for everyday tasks   Nustep level 4-8 rolling hills setting for B UE and LE reciprocal movement training and for LE strength and endurance. X 8 min. Check ins throughout to ensure appropriate response.   NMR: To facilitate reeducation of movement, balance, posture, coordination, and/or proprioception/kinesthetic sense.  Intro to new balance HEP as below, completed all reps and sets as described  Exercises - Marching Near Counter  - 1 x daily - 7 x weekly - 2 sets - 10 reps - 3 sec hold - Single leg balance near counter   - 1 x daily - 7 x weekly - 3 sets - 30 second hold - Heel Toe  Raises with Counter Support  - 1 x daily - 7 x weekly - 3 sets - 10 reps  Step up no UE using step over step pattern 2 x 10 ea LE   Side step to airex x 10 ea direction no UE assist   Stance on airex catching football x 15 catches   Unless otherwise stated, CGA was provided and gait belt donned in order to ensure pt safety   PATIENT EDUCATION:  Education details: Pt progressing so far with cardio and balance activity.     Person educated: Patient Education method: Medical Illustrator Education comprehension: verbalized understanding, returned demonstration, and verbal cues required  HOME EXERCISE PROGRAM: Access Code: [email or web address] URL: [email or web address] Date: 03/12/2024 Prepared by: Lonni Gainer  Exercises - Marching Near Counter  - 1 x daily - 7 x weekly - 2 sets - 10 reps - 3 sec hold - Single leg balance near counter   - 1 x daily - 7 x weekly - 3 sets - 30 second hold - Heel Toe Raises with Counter Support  - 1 x daily - 7 x weekly - 3 sets - 10 reps  ASSESSMENT:  CLINICAL IMPRESSION:   Patient arrived with good motivation for completion of pt activities. Pt challenged with endurance and initial balance based HEP to complete at home. Instructed in safety precautions to take to ensure safety with home activities. Pt begins progress towards POC for balance established in last session. Pt will continue to benefit from  skilled physical therapy intervention to address impairments, improve QOL, and attain therapy goals.    OBJECTIVE IMPAIRMENTS: decreased balance, decreased ROM, decreased strength, hypomobility, impaired flexibility, impaired UE functional use, and pain.   ACTIVITY LIMITATIONS: carrying, lifting, standing, bathing, dressing, reach over head, and hygiene/grooming PARTICIPATION LIMITATIONS: cleaning, laundry, shopping, community activity, and yard work PERSONAL FACTORS: Age and 3+ comorbidities: CAD, Depression, are also affecting  patient's functional outcome.  REHAB POTENTIAL: Good CLINICAL DECISION MAKING: Stable/uncomplicated EVALUATION COMPLEXITY: Low   GOALS: Goals reviewed with patient? No  SHORT TERM GOALS: Target date: 01/03/2024  Patient will demonstrate undestanding of home exercise plan by performing exercises correctly with evidence of good carry over with min to no verbal or tactile cues .   Baseline: NT 12/25/23: Performing exercises independently  Goal status: ACHIEVED   2.  Patient will demonstrate understanding of how to modify exercises to decrease thoracic and cervical pain exacerbation for improved function and to decrease incidence of pain flare ups.  Baseline: Does not currently have strategy to manage this  Goal status: NOT MET      LONG TERM GOALS: Target date: 03/13/2024  Patient will show a statistically significant improvement in his neck function as evidence by >=7.5 pt decrease in her neck disability index score to better be able to move neck to improve visual field while driving to avoid accidents. Vernel et al, 2009) Baseline: 33/50 (66%); 02/09/24: 66% 03/01/2024: 50% Goal status: NOT MET     2.  Patient will increase cervical mobility by  >=5 degrees AROM for all planes of motion (flexion, extension, side bending, and rotation) for improved cervical function and evidence of pain reduction. Baseline: Cervical AROM Flex 30, Ext 30, Rot R/L 40/40, SB R/L 35/35; 02/09/24: Rt rotation 50 degrees, Left rotation: 53 degress  03/01/2024: Flex:  30*   Ext: 20*      Rot L: 32*     Rot R: 30*      SB L: 20*       SB R: 15* Goal status: MET    3.  Patient will improve shoulder and periscapular strength by 1/3 grade MMT (4- to 4) for improved cervical stability and UE function to carry out upper extremity activities like washing his car and gardening without being limited by pain and discomfort.  Baseline: Shoulder Flex R/L 4/4, Shoulder Abd R/L 4/4; 10/31: shoulder flexion Rt: 5/5, left 4+/5;  shoulder ABDCT 5/5 bilat  03/01/2024: Shld Flex R: 5 Shld Flex L: 4+ , Shld abd R: 4 +  Shld Abd L: 4 Goal status: MET    4.  Patient will reduce neck and thoracic pain to <=4/10 NRPS while performing UE activities like washing his car and doing yard work as evidence of improved cervical and thoracic function.  Baseline: 10/10 cervical paraspinal and rhomboid pain with UE activity; up to a 9/10 intermittently with activity   03/01/2024: 8/10 NPS UE activity Goal status: NOT MET    5. Patient (> 78 years old) will complete five times sit to stand test in < 15 seconds indicating an increased LE strength and improved balance. Baseline: 17.28s Goal status: INITIAL  6. Patient will increase Berg Balance score by > 6 points to demonstrate decreased fall risk during functional activities Baseline: 47/56 Goal status: INITIAL  7. Patient will increase 10 meter walk test to >1.10m/s as to improve gait speed for better community ambulation and to reduce fall risk. Baseline: normal pace: 0.34m/s; fast pace: 0.975s Goal status:  INITIAL  8. Patient will reduce timed up and go to <10 seconds to reduce fall risk and demonstrate improved transfer/gait ability. Baseline: 11.27s Goal status: INITIAL  9. Patient will decrease distance of by 157ft in order to increase cardiovascular endurance for improved community ambulation distance. Baseline: 955ft in 6 min w/ SPC Goal status: INITIAL  10. Pt will increase score on FGA by 4 points in order to increase functional ambulation and increase confidence in community environments. Baseline: 17/30 Goal status: INITIAL PLAN:  PT FREQUENCY: 1-2x/week PT DURATION: 8 weeks PLANNED INTERVENTIONS: [id number]- PT Re-evaluation, 97750- Physical Performance Testing, 97110-Therapeutic exercises, 97530- Therapeutic activity, [id number]- Neuromuscular re-education, 97535- Self Care, [id number]- Manual therapy, [phone]- Gait training, [phone]- Canalith repositioning, [id number]- Aquatic  Therapy, [phone]- Electrical stimulation (unattended), [phone]- Electrical stimulation (manual), [id number]- Traction (mechanical), 20560 (1-2 muscles), 20561 (3+ muscles)- Dry Needling, Patient/Family education, Balance training, Joint mobilization, Joint manipulation, Spinal manipulation, Spinal mobilization, Vestibular training, DME instructions, Cryotherapy, and Moist heat  PLAN FOR NEXT SESSION:   -continue to address cervical ROM/pain -address global weakness, deconditioning, and imbalance.   Note: Portions of this document were prepared using Dragon voice recognition software and although reviewed may contain unintentional dictation errors in syntax, grammar, or spelling.  Lonni KATHEE Gainer PT ,DPT Physical Therapist- [place]  [facility]   8:12 AM, 03/12/24

## 2024-03-14 ENCOUNTER — Ambulatory Visit: Admitting: Physical Therapy

## 2024-03-14 ENCOUNTER — Ambulatory Visit

## 2024-03-14 ENCOUNTER — Ambulatory Visit: Admitting: Speech Pathology

## 2024-03-14 ENCOUNTER — Encounter: Admitting: Speech Pathology

## 2024-03-14 DIAGNOSIS — R269 Unspecified abnormalities of gait and mobility: Secondary | ICD-10-CM

## 2024-03-14 DIAGNOSIS — N1832 Chronic kidney disease, stage 3b: Secondary | ICD-10-CM | POA: Diagnosis not present

## 2024-03-14 DIAGNOSIS — R1312 Dysphagia, oropharyngeal phase: Secondary | ICD-10-CM

## 2024-03-14 DIAGNOSIS — R2689 Other abnormalities of gait and mobility: Secondary | ICD-10-CM

## 2024-03-14 DIAGNOSIS — M6281 Muscle weakness (generalized): Secondary | ICD-10-CM

## 2024-03-14 NOTE — Therapy (Signed)
 OUTPATIENT PHYSICAL THERAPY TREATMENT   Patient Name: Shawn Lam MRN: [id number] DOB:11/28/1950, 73 y.o., male Today's Date: 03/14/2024  END OF SESSION:  PT End of Session - 03/14/24 0917     Visit Number 13    Number of Visits 27    Date for Recertification  04/30/24    Progress Note Due on Visit 20    PT Start Time 0847    PT Stop Time 0929    PT Time Calculation (min) 42 min    Equipment Utilized During Treatment Gait belt    Activity Tolerance Patient tolerated treatment well    Behavior During Therapy [facility] for tasks assessed/performed            Past Medical History:  Diagnosis Date   Actinic keratosis 02/04/2021   right lateral neck   Anemia    Anxiety    Asymptomatic LV dysfunction    50-55% by echo 06/2016   BCC (basal cell carcinoma of skin) 08/05/2020   left neck infra auricular - nodular pattern - treated with [facility] 09/17/2020   Berger's disease    CAD (coronary artery disease) 06/2005   PCI of LAD and RCA   Depression    Dilated aortic root    4cm at sinus of Valsalva   Hematuria    Hyperlipidemia    Hypothyroidism    Insomnia    Left ureteral stone    Orthostatic hypotension    negative tilt table   OSA (obstructive sleep apnea)    moderate with AHI 17/hr, uses CPAP nightly(01/15/19 - has Inspire implant)   PVC's (premature ventricular contractions) 07/07/2016   Renal disorder    PT IS SEEING DR. BETSEY- NEPHROLOGIST   Rosacea    Urticaria    Past Surgical History:  Procedure Laterality Date   ANTERIOR CERVICAL DECOMP/DISCECTOMY FUSION N/A 03/13/2023   Procedure: CERVICAL FOUR-FIVE, CERVICAL FIVE-SIX ANTERIOR CERVICAL DISCECTOMY/DECOMPRESSION FUSION;  Surgeon: Onetha Kuba, MD;  Location: [facility] OR;  Service: Neurosurgery;  Laterality: N/A;  3C   CARDIAC CATHETERIZATION  09/06/2013   [facility]   CARDIAC CATHETERIZATION     COLONOSCOPY WITH PROPOFOL  N/A 01/21/2019   Procedure: COLONOSCOPY WITH PROPOFOL ;  Surgeon: Jinny Carmine, MD;  Location: [facility]  SURGERY CN[facility];  Service: Endoscopy;  Laterality: N/A;  sleep apnea   COLONOSCOPY WITH PROPOFOL  N/A 10/19/2021   Procedure: COLONOSCOPY WITH PROPOFOL ;  Surgeon: Jinny Carmine, MD;  Location: [facility] ENDOSCOPY;  Service: Endoscopy;  Laterality: N/A;   CORONARY ANGIOPLASTY  2004   STENT PLACEMENT   CYSTOSCOPY WITH RETROGRADE PYELOGRAM, URETEROSCOPY AND STENT PLACEMENT Left 03/19/2014   Procedure: CYSTOSCOPY WITH RETROGRADE PYELOGRAM, URETEROSCOPY AND STENT PLACEMENT;  Surgeon: Mickey Ricardo KATHEE Alvaro, MD;  Location: [facility] ORS;  Service: Urology;  Laterality: Left;   CYSTOSCOPY WITH RETROGRADE PYELOGRAM, URETEROSCOPY AND STENT PLACEMENT Bilateral 05/20/2015   Procedure:  CYSTOSCOPY WITH BILATERAL RETROGRADE PYELOGRAM,RIGHT  DIAGNOSTIC URETEROSCOPY ,LEFT URETEROSCOPY WITH HOLMIUM LASER  AND BILATERAL  STENT PLACEMENT ;  Surgeon: Ricardo Alvaro, MD;  Location: [facility] ORS;  Service: Urology;  Laterality: Bilateral;   DRUG INDUCED ENDOSCOPY N/A 10/20/2017   Procedure: DRUG INDUCED SLEEP ENDOSCOPY;  Surgeon: Carlie Clark, MD;  Location: [facility];  Service: ENT;  Laterality: N/A;   EP IMPLANTABLE DEVICE N/A 05/13/2016   Procedure: Loop Recorder Insertion;  Surgeon: Elspeth JAYSON Sage, MD;  Location: [facility] INVASIVE CV LAB;  Service: Cardiovascular;  Laterality: N/A;   ESOPHAGOGASTRODUODENOSCOPY N/A 10/19/2021   Procedure: ESOPHAGOGASTRODUODENOSCOPY (EGD);  Surgeon: Jinny Carmine, MD;  Location: [facility] ENDOSCOPY;  Service: Endoscopy;  Laterality: N/A;   ESOPHAGOGASTRODUODENOSCOPY (EGD) WITH PROPOFOL  N/A 11/30/2018   Procedure: ESOPHAGOGASTRODUODENOSCOPY (EGD) WITH PROPOFOL ;  Surgeon: Therisa Bi, MD;  Location: [facility] ENDOSCOPY;  Service: Gastroenterology;  Laterality: N/A;   ESOPHAGOGASTRODUODENOSCOPY (EGD) WITH PROPOFOL  N/A 12/01/2018   Procedure: ESOPHAGOGASTRODUODENOSCOPY (EGD) WITH PROPOFOL ;  Surgeon: Jinny Carmine, MD;  Location: [facility] ENDOSCOPY;  Service: Endoscopy;  Laterality: N/A;   HOLMIUM LASER APPLICATION Bilateral  05/20/2015   Procedure: HOLMIUM LASER APPLICATION;  Surgeon: Ricardo Likens, MD;  Location: [facility];  Service: Urology;  Laterality: Bilateral;   IMPLIMENTATION OF A HYPOGLOSSAL NERVE STIMULATOR  12/08/2017   OSA    IR GASTROSTOMY TUBE MOD SED  01/18/2024   IR RADIOLOGIST EVAL & MGMT  02/13/2024   LEFT HEART CATHETERIZATION WITH CORONARY ANGIOGRAM N/A 09/06/2013   Procedure: LEFT HEART CATHETERIZATION WITH CORONARY ANGIOGRAM;  Surgeon: Victory LELON Claudene DOUGLAS, MD;  Location[facility] CATH LAB;  Service: Cardiovascular;  Laterality: N/A;   LEFT KNEE CAP  SURGERY ABOUT 40 YRS AGO  ? 1970     STONE EXTRACTION WITH BASKET Left 03/19/2014   Procedure: STONE EXTRACTION WITH BASKET;  Surgeon: Mickey Ricardo KATHEE Likens, MD;  Location: [facility];  Service: Urology;  Laterality: Left;   TRANSURETHRAL RESECTION OF PROSTATE N/A 08/25/2021   Procedure: TRANSURETHRAL RESECTION OF THE PROSTATE (TURP);  Surgeon: Likens Ricardo, MD;  Location: [facility];  Service: Urology;  Laterality: N/A;  1 HR   Patient Active Problem List   Diagnosis Date Noted   S/P gastrostomy tube (G tube) placement, follow-up exam 02/18/2024   Skin infection at gastrostomy tube site (HCC) 02/06/2024   Protein-calorie malnutrition, severe 01/23/2024   Multifocal pneumonia 01/22/2024   Fall 01/03/2024   Hematoma of right hip 01/03/2024   Chronic pulmonary aspiration 12/29/2023   Silent aspiration 12/29/2023   Moderate protein-calorie malnutrition (weight for age 22-74% of standard) 12/29/2023   Bilateral leg weakness 12/29/2023   Chronic respiratory failure with hypoxia, on home oxygen  therapy (HCC) 12/29/2023   Chronic constipation 12/29/2023   Somnolence 12/29/2023   Frequent falls 12/29/2023   Feeding difficulties, unspecified 12/20/2023   Shortness of breath 12/20/2023   Abnormal CT of the chest 10/10/2023   Elevated blood sugar 06/30/2023   Carotid stenosis 05/11/2023   Spinal stenosis in cervical region 03/13/2023   Osteoarthritis of spine with  radiculopathy, cervical region 07/14/2022   Cervicogenic headache 11/30/2021   Chronic neck pain 11/30/2021   Weight loss, non-intentional    Dysphagia    Polyp of colon    Prostatic hyperplasia 08/25/2021   Chronic insomnia 03/19/2020   Peripheral neuropathy 03/19/2020   Iron deficiency anemia 01/06/2020   Allergic dermatitis 07/08/2019   Osteoarthritis of left knee 02/26/2019   History of colonic polyps    History of duodenal ulcer    CKD (chronic kidney disease), stage IIIa    Bilateral chronic knee pain 11/09/2018   Epistaxis 10/02/2018   Subjective tinnitus, bilateral 10/02/2018   Chronic pain of right thumb 02/22/2018   Acute bilateral thoracic back pain 01/23/2018   Osteoporosis without current pathological fracture 02/10/2017   Hypothyroidism 11/02/2016   Thoracic compression fracture (HCC) 09/06/2016   PVC's (premature ventricular contractions) 07/07/2016   Hypoalbuminemia 06/28/2016   IgA nephropathy 06/07/2016   Acute buttock pain 05/23/2016   MDD (major depressive disorder), recurrent episode, moderate (HCC) 11/20/2015   OSA (obstructive sleep apnea) 02/05/2014   Orthostatic hypotension 02/07/2013   Scotoma involving central area 01/29/2013   Optic  neuropathy 01/28/2013   HEMATURIA UNSPECIFIED 01/21/2010   HLD (hyperlipidemia) 07/05/2006   Generalized anxiety disorder 07/05/2006   CAD (coronary artery disease) 07/05/2006   GERD 07/05/2006    PCP: Dr. Greig Ring    REFERRING PROVIDER: Dr. Arley Helling   REFERRING DIAG: M54.6 (ICD-10-CM) - Acute bilateral thoracic back pain  THERAPY DIAG:  Abnormality of gait  Muscle weakness (generalized)  Balance disorder  Rationale for Evaluation and Treatment: Rehabilitation  ONSET DATE: May 2025    SUBJECTIVE:                                                                                                                                                                                                          SUBJECTIVE STATEMENT  Pt reports he is doing well, no updates and had a good thanksgiving. Still having issues with his balance and mobility. Still having neck discomfort, was supposed to see MD today but MD had to reschedule due to an emergency surgery.   PERTINENT HISTORY:   73yoM referred to physical therapy for ongoing neck pain following cervical fusion in May 2020 and newly developed thoracic pain from T12 compression fracture that occurred sometime in Spring of 2025 (associated radicular pain pattern). Pt has pain localized to upper traps and suboccipitals. Pt attempted DC of prednisone  and gabapentin  two weeks prior to evaluation here, severe decline in function due to pain, barely able to walk for two weeks. He describes feeling increased fatigue and weakness to the point where he struggled to walk. Pt also reported repeated falls in the setting of orthostatic hypotension with him experiencing syncope when he stands up from a seated position. He describes feeling a burning sensation that is also localized to his scapulae and that it occurs when he is doing UE activities like washing his car that is likely the radicular pain from thoracic vertebrae compression fracture.  PAIN: Are you having pain? 03/05/2024: 6/10 today in the neck  PRECAUTIONS: None Pt has thoracic compression fractures and is s/p cervical spine surgery   WEIGHT BEARING RESTRICTIONS: No  FALLS:  Has patient fallen in last 6 months? Yes. Number of falls 6, pt reports that falls are happening in the context of syncope when standing up from a seated position (Orthostatic Hypotension) , where he blacks out and falls. He is following up with his PCP, Dr. Karlene who is working with him on stabilizing his blood pressure and she has recently taken him off Gabapentin  to see if this helps. Pt reports that he does note an improvement in his  symptoms with less light headedness since going off medication.   LIVING  ENVIRONMENT: Lives with: lives with their spouse Lives in: House/apartment Stairs: No Has following equipment at home: Single point cane and Environmental Consultant - 4 wheeled  OCCUPATION: Retired     PLOF: Independent  PATIENT GOALS: Improve balance, decrease falls.       OBJECTIVE:   Note: Objective measures were completed at evaluation unless otherwise noted. Reassessment Measures 02/09/24: -eyes closed balance, normal stance: <5sec with retropulsion each time, ModA falls recovery  -narrow stance eyes open balance: <10sec with LOB, ModA falls recovery  -Long distance AMB assessment c SPC: 114ft with 2 DOE standing breaks ~15 seconds each, 15sec; crossover gait intermittently without LOB; -standing vitals taken at end of walk: stable and comparable to prior seated vitals.  -cervical rotation ROM: 50deg Rt, 53 degree left  -shoulder flexion MMT: 5/5 Rt, 4+/5 left; shoulder ABDCT MMT: 5/5 bilat  -ankle DF assessment: can perform >10x full range while leaning against wall (adequate for gait needs)  -sensory testing about ankles, lower legs: (feels intact, but pt reports constance N/T below ankle to his toes)  Standing BP assessment: 02/28/24 -124/23mmHg 95bpm standing @ 0 -122/50mmHg 90bpm standing @ 1 110/57mmHg 92bpm standing @ 2  TREATMENT DATE 03/14/24:  Self Care:  Orthostatic symptoms upon arrival walking from waiting area to treatment area so BP was taken. 97/67 seated  Standing mild symptoms: 95/69 Instructed to mention if symptoms worsen during treatment  TA- To improve functional movements patterns for everyday tasks   Nustep level 5-9 rolling hills setting for B UE and LE reciprocal movement training and for LE strength and endurance. X 8 min. Check ins throughout to ensure appropriate response.   NMR: To facilitate reeducation of movement, balance, posture, coordination, and/or proprioception/kinesthetic sense.  1 LE on airex other on step 3 x 30 sec ea LE   Wobble board 3  x 30 sec a/p and 3 x 30 sec lateral orientation - reports lateral was more difficulty   Incline board heel raises with focus on maintaining balance at end of eccentric portion   Feet on airex STS with football toss to sometimes moving target then catch x 10   Forward and retro gait with SPT anterior to improve posture , SPT direction randomly transitioning from normal to retro gait  with visual hand motions. x 4 min   Unless otherwise stated, CGA was provided and gait belt donned in order to ensure pt safety   PATIENT EDUCATION:  Education details: Pt progressing so far with cardio and balance activity.     Person educated: Patient Education method: Medical Illustrator Education comprehension: verbalized understanding, returned demonstration, and verbal cues required  HOME EXERCISE PROGRAM: Access Code: [email or web address] URL: [email or web address] Date: 03/12/2024 Prepared by: Lonni Gainer  Exercises - Marching Near Counter  - 1 x daily - 7 x weekly - 2 sets - 10 reps - 3 sec hold - Single leg balance near counter   - 1 x daily - 7 x weekly - 3 sets - 30 second hold - Heel Toe Raises with Counter Support  - 1 x daily - 7 x weekly - 3 sets - 10 reps  ASSESSMENT:  CLINICAL IMPRESSION:   Patient arrived with good motivation for completion of pt activities. Pt challenged with endurance and balance based activities to improve balance responses and reactions. Pt challenged with mostly static activities but will benefit from progression to more dynamic balance as he continues  to adapt and improve. Pt still having low blood pressure but was relatively stable throughout session without exacerbation of symptoms.  Pt will continue to benefit from skilled physical therapy intervention to address impairments, improve QOL, and attain therapy goals.    OBJECTIVE IMPAIRMENTS: decreased balance, decreased ROM, decreased strength, hypomobility, impaired flexibility, impaired UE  functional use, and pain.   ACTIVITY LIMITATIONS: carrying, lifting, standing, bathing, dressing, reach over head, and hygiene/grooming PARTICIPATION LIMITATIONS: cleaning, laundry, shopping, community activity, and yard work PERSONAL FACTORS: Age and 3+ comorbidities: CAD, Depression, are also affecting patient's functional outcome.  REHAB POTENTIAL: Good CLINICAL DECISION MAKING: Stable/uncomplicated EVALUATION COMPLEXITY: Low   GOALS: Goals reviewed with patient? No  SHORT TERM GOALS: Target date: 01/03/2024  Patient will demonstrate undestanding of home exercise plan by performing exercises correctly with evidence of good carry over with min to no verbal or tactile cues .   Baseline: NT 12/25/23: Performing exercises independently  Goal status: ACHIEVED   2.  Patient will demonstrate understanding of how to modify exercises to decrease thoracic and cervical pain exacerbation for improved function and to decrease incidence of pain flare ups.  Baseline: Does not currently have strategy to manage this  Goal status: NOT MET      LONG TERM GOALS: Target date: 03/13/2024  Patient will show a statistically significant improvement in his neck function as evidence by >=7.5 pt decrease in her neck disability index score to better be able to move neck to improve visual field while driving to avoid accidents. Vernel et al, 2009) Baseline: 33/50 (66%); 02/09/24: 66% 03/01/2024: 50% Goal status: NOT MET     2.  Patient will increase cervical mobility by  >=5 degrees AROM for all planes of motion (flexion, extension, side bending, and rotation) for improved cervical function and evidence of pain reduction. Baseline: Cervical AROM Flex 30, Ext 30, Rot R/L 40/40, SB R/L 35/35; 02/09/24: Rt rotation 50 degrees, Left rotation: 53 degress  03/01/2024: Flex:  30*   Ext: 20*      Rot L: 32*     Rot R: 30*      SB L: 20*       SB R: 15* Goal status: MET    3.  Patient will improve shoulder and  periscapular strength by 1/3 grade MMT (4- to 4) for improved cervical stability and UE function to carry out upper extremity activities like washing his car and gardening without being limited by pain and discomfort.  Baseline: Shoulder Flex R/L 4/4, Shoulder Abd R/L 4/4; 10/31: shoulder flexion Rt: 5/5, left 4+/5; shoulder ABDCT 5/5 bilat  03/01/2024: Shld Flex R: 5 Shld Flex L: 4+ , Shld abd R: 4 +  Shld Abd L: 4 Goal status: MET    4.  Patient will reduce neck and thoracic pain to <=4/10 NRPS while performing UE activities like washing his car and doing yard work as evidence of improved cervical and thoracic function.  Baseline: 10/10 cervical paraspinal and rhomboid pain with UE activity; up to a 9/10 intermittently with activity   03/01/2024: 8/10 NPS UE activity Goal status: NOT MET    5. Patient (> 35 years old) will complete five times sit to stand test in < 15 seconds indicating an increased LE strength and improved balance. Baseline: 17.28s Goal status: INITIAL  6. Patient will increase Berg Balance score by > 6 points to demonstrate decreased fall risk during functional activities Baseline: 47/56 Goal status: INITIAL  7. Patient will increase 10 meter  walk test to >1.75m/s as to improve gait speed for better community ambulation and to reduce fall risk. Baseline: normal pace: 0.48m/s; fast pace: 0.975s Goal status: INITIAL  8. Patient will reduce timed up and go to <10 seconds to reduce fall risk and demonstrate improved transfer/gait ability. Baseline: 11.27s Goal status: INITIAL  9. Patient will decrease distance of by 154ft in order to increase cardiovascular endurance for improved community ambulation distance. Baseline: 978ft in 6 min w/ SPC Goal status: INITIAL  10. Pt will increase score on FGA by 4 points in order to increase functional ambulation and increase confidence in community environments. Baseline: 17/30 Goal status: INITIAL PLAN:  PT FREQUENCY:  1-2x/week PT DURATION: 8 weeks PLANNED INTERVENTIONS: [id number]- PT Re-evaluation, 97750- Physical Performance Testing, 97110-Therapeutic exercises, 97530- Therapeutic activity, [id number]- Neuromuscular re-education, 97535- Self Care, [id number]- Manual therapy, [phone]- Gait training, [phone]- Canalith repositioning, [id number]- Aquatic Therapy, [phone]- Electrical stimulation (unattended), [phone]- Electrical stimulation (manual), [id number]- Traction (mechanical), 20560 (1-2 muscles), [id number] (3+ muscles)- Dry Needling, Patient/Family education, Balance training, Joint mobilization, Joint manipulation, Spinal manipulation, Spinal mobilization, Vestibular training, DME instructions, Cryotherapy, and Moist heat  PLAN FOR NEXT SESSION:   -continue to address cervical ROM/pain -address global weakness, deconditioning, and imbalance.   Note: Portions of this document were prepared using Dragon voice recognition software and although reviewed may contain unintentional dictation errors in syntax, grammar, or spelling.  Lonni KATHEE Gainer PT ,DPT Physical Therapist- [place]  [facility]   9:18 AM, 03/14/24

## 2024-03-14 NOTE — Therapy (Signed)
 OUTPATIENT SPEECH LANGUAGE PATHOLOGY  SWALLOW TREATMENT     Patient Name: Shawn Lam MRN: [id number] DOB:1950-10-17, 73 y.o., male Today's Date: 03/14/2024  PCP: Greig Ring, MD REFERRING PROVIDER: Greig Ring, MD    End of Session - 03/14/24 0936     Visit Number 14    Number of Visits 25    Date for Recertification  03/25/24    Authorization Type Medicare Part A    Progress Note Due on Visit 20    SLP Start Time 0935    SLP Stop Time  1005    SLP Time Calculation (min) 30 min    Activity Tolerance Patient tolerated treatment well          Past Medical History:  Diagnosis Date   Actinic keratosis 02/04/2021   right lateral neck   Anemia    Anxiety    Asymptomatic LV dysfunction    50-55% by echo 06/2016   BCC (basal cell carcinoma of skin) 08/05/2020   left neck infra auricular - nodular pattern - treated with [facility] 09/17/2020   Berger's disease    CAD (coronary artery disease) 06/2005   PCI of LAD and RCA   Depression    Dilated aortic root    4cm at sinus of Valsalva   Hematuria    Hyperlipidemia    Hypothyroidism    Insomnia    Left ureteral stone    Orthostatic hypotension    negative tilt table   OSA (obstructive sleep apnea)    moderate with AHI 17/hr, uses CPAP nightly(01/15/19 - has Inspire implant)   PVC's (premature ventricular contractions) 07/07/2016   Renal disorder    PT IS SEEING DR. BETSEY- NEPHROLOGIST   Rosacea    Urticaria    Past Surgical History:  Procedure Laterality Date   ANTERIOR CERVICAL DECOMP/DISCECTOMY FUSION N/A 03/13/2023   Procedure: CERVICAL FOUR-FIVE, CERVICAL FIVE-SIX ANTERIOR CERVICAL DISCECTOMY/DECOMPRESSION FUSION;  Surgeon: Onetha Kuba, MD;  Location: [facility] OR;  Service: Neurosurgery;  Laterality: N/A;  3C   CARDIAC CATHETERIZATION  09/06/2013   [facility]   CARDIAC CATHETERIZATION     COLONOSCOPY WITH PROPOFOL  N/A 01/21/2019   Procedure: COLONOSCOPY WITH PROPOFOL ;  Surgeon: Jinny Carmine, MD;  Location: [facility];  Service: Endoscopy;  Laterality: N/A;  sleep apnea   COLONOSCOPY WITH PROPOFOL  N/A 10/19/2021   Procedure: COLONOSCOPY WITH PROPOFOL ;  Surgeon: Jinny Carmine, MD;  Location: [facility] ENDOSCOPY;  Service: Endoscopy;  Laterality: N/A;   CORONARY ANGIOPLASTY  2004   STENT PLACEMENT   CYSTOSCOPY WITH RETROGRADE PYELOGRAM, URETEROSCOPY AND STENT PLACEMENT Left 03/19/2014   Procedure: CYSTOSCOPY WITH RETROGRADE PYELOGRAM, URETEROSCOPY AND STENT PLACEMENT;  Surgeon: Mickey Ricardo KATHEE Alvaro, MD;  Location: [facility];  Service: Urology;  Laterality: Left;   CYSTOSCOPY WITH RETROGRADE PYELOGRAM, URETEROSCOPY AND STENT PLACEMENT Bilateral 05/20/2015   Procedure:  CYSTOSCOPY WITH BILATERAL RETROGRADE PYELOGRAM,RIGHT  DIAGNOSTIC URETEROSCOPY ,LEFT URETEROSCOPY WITH HOLMIUM LASER  AND BILATERAL  STENT PLACEMENT ;  Surgeon: Ricardo Alvaro, MD;  Location: [facility];  Service: Urology;  Laterality: Bilateral;   DRUG INDUCED ENDOSCOPY N/A 10/20/2017   Procedure: DRUG INDUCED SLEEP ENDOSCOPY;  Surgeon: Carlie Clark, MD;  Location: [facility];  Service: ENT;  Laterality: N/A;   EP IMPLANTABLE DEVICE N/A 05/13/2016   Procedure: Loop Recorder Insertion;  Surgeon: Elspeth JAYSON Sage, MD;  Location: [facility];  Service: Cardiovascular;  Laterality: N/A;   ESOPHAGOGASTRODUODENOSCOPY N/A 10/19/2021   Procedure: ESOPHAGOGASTRODUODENOSCOPY (EGD);  Surgeon: Jinny Carmine, MD;  Location: [facility] ENDOSCOPY;  Service: Endoscopy;  Laterality: N/A;   ESOPHAGOGASTRODUODENOSCOPY (EGD) WITH PROPOFOL  N/A 11/30/2018   Procedure: ESOPHAGOGASTRODUODENOSCOPY (EGD) WITH PROPOFOL ;  Surgeon: Therisa Bi, MD;  Location: [facility] ENDOSCOPY;  Service: Gastroenterology;  Laterality: N/A;   ESOPHAGOGASTRODUODENOSCOPY (EGD) WITH PROPOFOL  N/A 12/01/2018   Procedure: ESOPHAGOGASTRODUODENOSCOPY (EGD) WITH PROPOFOL ;  Surgeon: Jinny Carmine, MD;  Location: [facility] ENDOSCOPY;  Service: Endoscopy;  Laterality: N/A;   HOLMIUM LASER APPLICATION Bilateral 05/20/2015    Procedure: HOLMIUM LASER APPLICATION;  Surgeon: Ricardo Likens, MD;  Location: [facility];  Service: Urology;  Laterality: Bilateral;   IMPLIMENTATION OF A HYPOGLOSSAL NERVE STIMULATOR  12/08/2017   OSA    IR GASTROSTOMY TUBE MOD SED  01/18/2024   IR RADIOLOGIST EVAL & MGMT  02/13/2024   LEFT HEART CATHETERIZATION WITH CORONARY ANGIOGRAM N/A 09/06/2013   Procedure: LEFT HEART CATHETERIZATION WITH CORONARY ANGIOGRAM;  Surgeon: Victory LELON Claudene DOUGLAS, MD;  Location: [facility] CATH LAB;  Service: Cardiovascular;  Laterality: N/A;   LEFT KNEE CAP  SURGERY ABOUT 40 YRS AGO  ? 1970     STONE EXTRACTION WITH BASKET Left 03/19/2014   Procedure: STONE EXTRACTION WITH BASKET;  Surgeon: Mickey Ricardo KATHEE Likens, MD;  Location: [facility];  Service: Urology;  Laterality: Left;   TRANSURETHRAL RESECTION OF PROSTATE N/A 08/25/2021   Procedure: TRANSURETHRAL RESECTION OF THE PROSTATE (TURP);  Surgeon: Likens Ricardo, MD;  Location: [facility];  Service: Urology;  Laterality: N/A;  1 HR   Patient Active Problem List   Diagnosis Date Noted   S/P gastrostomy tube (G tube) placement, follow-up exam 02/18/2024   Skin infection at gastrostomy tube site (HCC) 02/06/2024   Protein-calorie malnutrition, severe 01/23/2024   Multifocal pneumonia 01/22/2024   Fall 01/03/2024   Hematoma of right hip 01/03/2024   Chronic pulmonary aspiration 12/29/2023   Silent aspiration 12/29/2023   Moderate protein-calorie malnutrition (weight for age 32-74% of standard) 12/29/2023   Bilateral leg weakness 12/29/2023   Chronic respiratory failure with hypoxia, on home oxygen  therapy (HCC) 12/29/2023   Chronic constipation 12/29/2023   Somnolence 12/29/2023   Frequent falls 12/29/2023   Feeding difficulties, unspecified 12/20/2023   Shortness of breath 12/20/2023   Abnormal CT of the chest 10/10/2023   Elevated blood sugar 06/30/2023   Carotid stenosis 05/11/2023   Spinal stenosis in cervical region 03/13/2023   Osteoarthritis of spine with  radiculopathy, cervical region 07/14/2022   Cervicogenic headache 11/30/2021   Chronic neck pain 11/30/2021   Weight loss, non-intentional    Dysphagia    Polyp of colon    Prostatic hyperplasia 08/25/2021   Chronic insomnia 03/19/2020   Peripheral neuropathy 03/19/2020   Iron deficiency anemia 01/06/2020   Allergic dermatitis 07/08/2019   Osteoarthritis of left knee 02/26/2019   History of colonic polyps    History of duodenal ulcer    CKD (chronic kidney disease), stage IIIa    Bilateral chronic knee pain 11/09/2018   Epistaxis 10/02/2018   Subjective tinnitus, bilateral 10/02/2018   Chronic pain of right thumb 02/22/2018   Acute bilateral thoracic back pain 01/23/2018   Osteoporosis without current pathological fracture 02/10/2017   Hypothyroidism 11/02/2016   Thoracic compression fracture (HCC) 09/06/2016   PVC's (premature ventricular contractions) 07/07/2016   Hypoalbuminemia 06/28/2016   IgA nephropathy 06/07/2016   Acute buttock pain 05/23/2016   MDD (major depressive disorder), recurrent episode, moderate (HCC) 11/20/2015   OSA (obstructive sleep apnea) 02/05/2014   Orthostatic hypotension 02/07/2013   Scotoma involving central area 01/29/2013   Optic  neuropathy 01/28/2013   HEMATURIA UNSPECIFIED 01/21/2010   HLD (hyperlipidemia) 07/05/2006   Generalized anxiety disorder 07/05/2006   CAD (coronary artery disease) 07/05/2006   GERD 07/05/2006    ONSET DATE: 03/2023 following ACDF; Date of referral 12/28/2023: date of this referral 02/01/2024  REFERRING DIAG:  T17.908D (ICD-10-CM) - Chronic pulmonary aspiration, subsequent encounter  T17.900D (ICD-10-CM) - Silent aspiration, subsequent encounter  E44.0 (ICD-10-CM) - Moderate protein-calorie malnutrition  R13.10 (ICD-10-CM) - Dysphagia, unspecified type    THERAPY DIAG:  Dysphagia, oropharyngeal phase  Rationale for Evaluation and Treatment Rehabilitation  SUBJECTIVE:   PERTINENT HISTORY and DIAGNOSTIC  FINDINGS:  Pt is a 73 yo male w/ ACDF surgery 03/2023.  PMH: GERD(Not on a PPI per chart), Actinic keratosis (02/04/2021), Anemia, Anxiety, Asymptomatic LV dysfunction, BCC (basal cell carcinoma of skin) (08/05/2020), Berger's disease, CAD (coronary artery disease) (06/2005), resting tremor, Depression, Dilated aortic root (HCC), Hematuria, Hyperlipidemia, Hypothyroidism, Insomnia, Left ureteral stone, Orthostatic hypotension, OSA (obstructive sleep apnea), PVC's (premature ventricular contractions) (07/07/2016), Renal disorder, Rosacea, and Urticaria.  Pt is followed by Cardiology for Coronary Artery Stenosis currently; also followed for Migraines.     EGD in 2023 revealed Gastritis.      CT of Chest 10/2023: 1. Interim development of patchy consolidation and ground-glass   disease most evident at the right middle lobe and right lower lobes   with additional areas of mild ground-glass disease and scattered   clustered nodularity, findings are suspicious for multifocal   infection/pneumonia. Imaging follow-up to resolution is recommended.   2. Emphysema.   3. Aortic atherosclerosis.            Dysphagia - reported by pt as Chronic, ever since neck surgery in December 2024. Pt was seen in the emergency room s/p Fall, notes detailing that he slipped on soap in the shower, fell backwards on right side hit head and neck against shower seat, possible brief loss of consciousness, per chart.  ACDF 03/2023 per chart.  Since the neck surgery in 03/2023, he has noted food, liquids and pill getting stuck on the right side of my throat more.  I used to cough a lot more but not as much now.  I cough up what gets stuck..  He has lost significant weight per chart notes due to decreased eating.  Pt endorsed using his home O2 when he feels SOB.   MBSS 12/26/2023 Patient appears to present w/ Chronic, moderate-severe Pharyngeal phase dysphagia per this assessment today. It appeared there was some impact from changes in  the Cervical Esophagus s/p ACDF surgery(03/2023 per chart notes) including structural/tissue changes in/aournd the CP segment and cervical esophagus. Pt with intermittent silent aspiration of thin liquids.   Recommend continue current diet of easy to eat foods at home; thin liquids via cup- at this time. Oral care Before any oral intake. Swallow Strategies Recommended: No straws, moistened foods, SMALL bites/sips, Head Turn RIGHT w/ slight chin tuck, multiple swallows to aid clearing, AND strong Cough/throat clear b/t bites/sips and at end of meal.  D/t the Chronicity of pt's Pharyngeal phase Dysphagia w/ weight loss and pulmonary impact per recent Chest CT, pt and Family may need to discuss alternative means of nutrition/hydration as support w/ his Medical Team/PCP, while seeking swallow therapy.  Received ENT report dated 01/12/2024 Laryngoscopy revealed  Moderate vocal fold bowing bilaterally and moderate glottic gamp with phonation Impression/plan: Aspiration on MBSS and significant weight loss since ACDF last year. TVF shows moderate sized glottic gap on laryngoscoy but  mobile vocal folds bilaterally. Pooling in pyriform on MBSS but no significant pooling of secretions on laryngoscopy. I feel that between speech therpay and possibly bilateral injection laryngoplasty, hopefully his swallowing will significantly improve. Will make referral to Dr Brien at [facility].   Pt and his wife report appt with Dr Brien in January 2026.  10/10/205 PEG placed  01/22/2024 thru 01/24/2024 Hospitalized d/t fall and confusion CT of the chest on arrival showed multifocal pneumonia and possible free air. When reviewed further with interventional radiology team it was determined this was an appropriate amount of free air secondary to recent PEG tube placement. Patient was initiated on antibiotic therapy for pneumonia and improved significantly.   02/07/2024 CT Chest 1. Evolving multifocal inflammatory  infiltrates, progressive within the right upper lobe and improved within the lung bases bilaterally. Recommend appropriate clinical management and imaging follow-up per clinical course. 2. Mild emphysema. Given emphysema as an independent lung cancer risk factor, consider evaluation for a low-dose CT lung cancer screening program if the patient is 73 years old and otherwise eligible.   PAIN:  Are you having pain? No  FALLS: Has patient fallen in last 6 months?  See PT evaluation for details  LIVING ENVIRONMENT: Lives with: lives with their spouse Lives in: House/apartment  PLOF:  Level of assistance: Independent with ADLs, Independent with IADLs Employment: Retired   PATIENT GOALS  to improve swallow function, nutrition and hydration, decrease aspiration risks  SUBJECTIVE STATEMENT: Pt pleasant, reports wife is feeling better Pt accompanied by: self   OBJECTIVE:   TODAY'S TREATMENT:  Skilled treatment session focused on pt's dysphagia goals. SLP facilitated the session by providing the following interventions:       Pt reports continued stability in weight (114lb).     Respiratory Muscle Strengthening: Pt independently able to maneuver devices correctly. EMST 75 set to 35 cm H2O: Pt able to complete three sets of 8 reps independently.  IMST set to 30 cm H2O: Pt able to complete 3 sets of 8 reps independently.  Pharyngeal exercises: Masako: Pt able to complete 3 sets of 8 reps independently.  Head Extension Swallowing Exercise - Pt able to complete 3 sets of 8 repetitions independently. Pt observed with throat clear and cough on 3rd set. CTAR: Pt able to independently complete 3 sets of 8 reps. Pt observed with one instance of cough and throat clear.    PATIENT EDUCATION: Education details: ST POC Person educated: Patient and Spouse Education method: Chief Technology Officer Education comprehension: needs further education  HOME EXERCISE PROGRAM:     See  above   GOALS: Goals reviewed with patient? Yes  SHORT TERM GOALS: Target date: 10 sessions  Updated 02/28/2024  Updated 02/09/2024 With Mod I, pt will perform compensatory swallow strategies (chin tuck, multiple swallows, head turn) to eliminate s/s of aspiration when consuming least restrictive diet. Baseline: Goal status: INITIAL: moderate A: Progress made, fluctuating between supervision and independent cues  2.  With Min A, pt will complete (supraglottic, swallow, Mednelson maneuver, effortful swallow) to improve oral motor weakness, tongue base retraction, hyolarnygeal excursion, airway protection and/or clearance of the bolus through the pharynx. Baseline:  Goal status: INITIAL: progress made; moderate A: Progress made, fluctuating between supervision and independent cues  3. Pt will demonstrate overt s/s of aspiration when consuming ice chips.   Baseline: Goal status: INITIAL: no overt s/s of aspiration observed during today's session: Intermittent overt s/s observed   LONG TERM GOALS: Target date: 03/25/2024  Updated  02/28/2024  Updated: 02/09/2024 With Mod I, pt will demonstrate the ability to adequately self-monitor swallowing skills and perform appropriate compensatory techniques to reduce s/s of aspiration and to safely consume least restrictive diet.     Baseline:  Goal status: INITIAL: continues to be appropriate goal  2.  Pt will participate in repeat instrumental swallow evaluation to assess least restrictive diet.  Baseline:  Goal status: INITIAL: continues to be appropriate goal: Modified Barium scheduled for 11/21   ASSESSMENT:  CLINICAL IMPRESSION: Patient is a 73 y.o. male who was seen today for a clinical swallow re-evaluation d/t severe pharyngeal phase dysphagia following ACDF (03/2023).    See the above treatment note for details.   OBJECTIVE IMPAIRMENTS include dysphagia. These impairments are limiting patient from safety when swallowing. Factors  affecting potential to achieve goals and functional outcome are severity of impairments and malnutrition. Patient will benefit from skilled SLP services to address above impairments and improve overall function.  REHAB POTENTIAL: Good  PLAN: SLP FREQUENCY: 1-2x/week  SLP DURATION: 12 weeks  PLANNED INTERVENTIONS: Aspiration precaution training, Pharyngeal strengthening exercises, Diet toleration management , Oral motor exercises, SLP instruction and feedback, Compensatory strategies, and Patient/family education   Shalondra Wunschel B. Rubbie, M.S., CCC-SLP, Tree Surgeon Certified Brain Injury Specialist [facility]  [facility] Office [phone] Ascom [phone] Fax [phone]

## 2024-03-15 ENCOUNTER — Encounter: Payer: Self-pay | Admitting: Family Medicine

## 2024-03-19 ENCOUNTER — Encounter: Admitting: Speech Pathology

## 2024-03-19 ENCOUNTER — Ambulatory Visit: Admitting: Speech Pathology

## 2024-03-19 ENCOUNTER — Telehealth: Payer: Self-pay | Admitting: Family Medicine

## 2024-03-19 ENCOUNTER — Ambulatory Visit

## 2024-03-19 ENCOUNTER — Encounter

## 2024-03-19 ENCOUNTER — Ambulatory Visit: Admitting: Physical Therapy

## 2024-03-19 DIAGNOSIS — R269 Unspecified abnormalities of gait and mobility: Secondary | ICD-10-CM

## 2024-03-19 DIAGNOSIS — M546 Pain in thoracic spine: Secondary | ICD-10-CM

## 2024-03-19 DIAGNOSIS — M6281 Muscle weakness (generalized): Secondary | ICD-10-CM

## 2024-03-19 DIAGNOSIS — R1312 Dysphagia, oropharyngeal phase: Secondary | ICD-10-CM | POA: Diagnosis not present

## 2024-03-19 DIAGNOSIS — R2689 Other abnormalities of gait and mobility: Secondary | ICD-10-CM

## 2024-03-19 DIAGNOSIS — G4733 Obstructive sleep apnea (adult) (pediatric): Secondary | ICD-10-CM

## 2024-03-19 NOTE — Therapy (Signed)
 OUTPATIENT SPEECH LANGUAGE PATHOLOGY  SWALLOW TREATMENT     Patient Name: Shawn Lam MRN: [id number] DOB:[birth year], 73 y.o., male Today's Date: 03/19/2024  PCP: Greig Ring, MD REFERRING PROVIDER: Greig Ring, MD    End of Session - 03/19/24 1054     Visit Number 15    Number of Visits 25    Date for Recertification  03/25/24    Authorization Type Medicare Part A    Progress Note Due on Visit 20    SLP Start Time 0930    SLP Stop Time  1000    SLP Time Calculation (min) 30 min    Activity Tolerance Patient tolerated treatment well          Past Medical History:  Diagnosis Date   Actinic keratosis 02/04/2021   right lateral neck   Anemia    Anxiety    Asymptomatic LV dysfunction    50-55% by echo 06/2016   BCC (basal cell carcinoma of skin) 08/05/2020   left neck infra auricular - nodular pattern - treated with [facility] 09/17/2020   Berger's disease    CAD (coronary artery disease) 06/2005   PCI of LAD and RCA   Depression    Dilated aortic root    4cm at sinus of Valsalva   Hematuria    Hyperlipidemia    Hypothyroidism    Insomnia    Left ureteral stone    Orthostatic hypotension    negative tilt table   OSA (obstructive sleep apnea)    moderate with AHI 17/hr, uses CPAP nightly(01/15/19 - has Inspire implant)   PVC's (premature ventricular contractions) 07/07/2016   Renal disorder    PT IS SEEING DR. BETSEY- NEPHROLOGIST   Rosacea    Urticaria    Past Surgical History:  Procedure Laterality Date   ANTERIOR CERVICAL DECOMP/DISCECTOMY FUSION N/A 03/13/2023   Procedure: CERVICAL FOUR-FIVE, CERVICAL FIVE-SIX ANTERIOR CERVICAL DISCECTOMY/DECOMPRESSION FUSION;  Surgeon: Onetha Kuba, MD;  Location: [facility] OR;  Service: Neurosurgery;  Laterality: N/A;  3C   CARDIAC CATHETERIZATION  09/06/2013   [facility]   CARDIAC CATHETERIZATION     COLONOSCOPY WITH PROPOFOL  N/A 01/21/2019   Procedure: COLONOSCOPY WITH PROPOFOL ;  Surgeon: Jinny Carmine, MD;  Location: [facility] SURGERY  [facility];  Service: Endoscopy;  Laterality: N/A;  sleep apnea   COLONOSCOPY WITH PROPOFOL  N/A 10/19/2021   Procedure: COLONOSCOPY WITH PROPOFOL ;  Surgeon: Jinny Carmine, MD;  Location: [facility] ENDOSCOPY;  Service: Endoscopy;  Laterality: N/A;   CORONARY ANGIOPLASTY  2004   STENT PLACEMENT   CYSTOSCOPY WITH RETROGRADE PYELOGRAM, URETEROSCOPY AND STENT PLACEMENT Left 03/19/2014   Procedure: CYSTOSCOPY WITH RETROGRADE PYELOGRAM, URETEROSCOPY AND STENT PLACEMENT;  Surgeon: Mickey Ricardo KATHEE Alvaro, MD;  Location: [facility] ORS;  Service: Urology;  Laterality: Left;   CYSTOSCOPY WITH RETROGRADE PYELOGRAM, URETEROSCOPY AND STENT PLACEMENT Bilateral 05/20/2015   Procedure:  CYSTOSCOPY WITH BILATERAL RETROGRADE PYELOGRAM,RIGHT  DIAGNOSTIC URETEROSCOPY ,LEFT URETEROSCOPY WITH HOLMIUM LASER  AND BILATERAL  STENT PLACEMENT ;  Surgeon: Ricardo Alvaro, MD;  Location: [facility];  Service: Urology;  Laterality: Bilateral;   DRUG INDUCED ENDOSCOPY N/A 10/20/2017   Procedure: DRUG INDUCED SLEEP ENDOSCOPY;  Surgeon: Carlie Clark, MD;  Location: [facility];  Service: ENT;  Laterality: N/A;   EP IMPLANTABLE DEVICE N/A 05/13/2016   Procedure: Loop Recorder Insertion;  Surgeon: Elspeth JAYSON Sage, MD;  Location: [facility] INVASIVE [facility] LAB;  Service: Cardiovascular;  Laterality: N/A;   ESOPHAGOGASTRODUODENOSCOPY N/A 10/19/2021   Procedure: ESOPHAGOGASTRODUODENOSCOPY (EGD);  Surgeon: Jinny Carmine, MD;  Location: [facility] ENDOSCOPY;  Service: Endoscopy;  Laterality: N/A;   ESOPHAGOGASTRODUODENOSCOPY (EGD) WITH PROPOFOL  N/A 11/30/2018   Procedure: ESOPHAGOGASTRODUODENOSCOPY (EGD) WITH PROPOFOL ;  Surgeon: Therisa Bi, MD;  Location: [facility] ENDOSCOPY;  Service: Gastroenterology;  Laterality: N/A;   ESOPHAGOGASTRODUODENOSCOPY (EGD) WITH PROPOFOL  N/A 12/01/2018   Procedure: ESOPHAGOGASTRODUODENOSCOPY (EGD) WITH PROPOFOL ;  Surgeon: Jinny Carmine, MD;  Location: [facility] ENDOSCOPY;  Service: Endoscopy;  Laterality: N/A;   HOLMIUM LASER APPLICATION Bilateral 05/20/2015    Procedure: HOLMIUM LASER APPLICATION;  Surgeon: Ricardo Likens, MD;  Location: [facility];  Service: Urology;  Laterality: Bilateral;   IMPLIMENTATION OF A HYPOGLOSSAL NERVE STIMULATOR  12/08/2017   OSA    IR GASTROSTOMY TUBE MOD SED  01/18/2024   IR RADIOLOGIST EVAL & MGMT  02/13/2024   LEFT HEART CATHETERIZATION WITH CORONARY ANGIOGRAM N/A 09/06/2013   Procedure: LEFT HEART CATHETERIZATION WITH CORONARY ANGIOGRAM;  Surgeon: Victory LELON Claudene DOUGLAS, MD;  Location: [facility] CATH LAB;  Service: Cardiovascular;  Laterality: N/A;   LEFT KNEE CAP  SURGERY ABOUT 40 YRS AGO  ? 1970     STONE EXTRACTION WITH BASKET Left 03/19/2014   Procedure: STONE EXTRACTION WITH BASKET;  Surgeon: Mickey Ricardo KATHEE Likens, MD;  Location: [facility];  Service: Urology;  Laterality: Left;   TRANSURETHRAL RESECTION OF PROSTATE N/A 08/25/2021   Procedure: TRANSURETHRAL RESECTION OF THE PROSTATE (TURP);  Surgeon: Likens Ricardo, MD;  Location: [facility];  Service: Urology;  Laterality: N/A;  1 HR   Patient Active Problem List   Diagnosis Date Noted   S/P gastrostomy tube (G tube) placement, follow-up exam 02/18/2024   Skin infection at gastrostomy tube site (HCC) 02/06/2024   Protein-calorie malnutrition, severe 01/23/2024   Multifocal pneumonia 01/22/2024   Fall 01/03/2024   Hematoma of right hip 01/03/2024   Chronic pulmonary aspiration 12/29/2023   Silent aspiration 12/29/2023   Moderate protein-calorie malnutrition (weight for age 19-74% of standard) 12/29/2023   Bilateral leg weakness 12/29/2023   Chronic respiratory failure with hypoxia, on home oxygen  therapy (HCC) 12/29/2023   Chronic constipation 12/29/2023   Somnolence 12/29/2023   Frequent falls 12/29/2023   Feeding difficulties, unspecified 12/20/2023   Shortness of breath 12/20/2023   Abnormal CT of the chest 10/10/2023   Elevated blood sugar 06/30/2023   Carotid stenosis 05/11/2023   Spinal stenosis in cervical region 03/13/2023   Osteoarthritis of spine with  radiculopathy, cervical region 07/14/2022   Cervicogenic headache 11/30/2021   Chronic neck pain 11/30/2021   Weight loss, non-intentional    Dysphagia    Polyp of colon    Prostatic hyperplasia 08/25/2021   Chronic insomnia 03/19/2020   Peripheral neuropathy 03/19/2020   Iron deficiency anemia 01/06/2020   Allergic dermatitis 07/08/2019   Osteoarthritis of left knee 02/26/2019   History of colonic polyps    History of duodenal ulcer    CKD (chronic kidney disease), stage IIIa    Bilateral chronic knee pain 11/09/2018   Epistaxis 10/02/2018   Subjective tinnitus, bilateral 10/02/2018   Chronic pain of right thumb 02/22/2018   Acute bilateral thoracic back pain 01/23/2018   Osteoporosis without current pathological fracture 02/10/2017   Hypothyroidism 11/02/2016   Thoracic compression fracture (HCC) 09/06/2016   PVC's (premature ventricular contractions) 07/07/2016   Hypoalbuminemia 06/28/2016   IgA nephropathy 06/07/2016   Acute buttock pain 05/23/2016   MDD (major depressive disorder), recurrent episode, moderate (HCC) 11/20/2015   OSA (obstructive sleep apnea) 02/05/2014   Orthostatic hypotension 02/07/2013   Scotoma involving central area 01/29/2013   Optic  neuropathy 01/28/2013   HEMATURIA UNSPECIFIED 01/21/2010   HLD (hyperlipidemia) 07/05/2006   Generalized anxiety disorder 07/05/2006   CAD (coronary artery disease) 07/05/2006   GERD 07/05/2006    ONSET DATE: 03/2023 following ACDF; Date of referral 12/28/2023: date of this referral 02/01/2024  REFERRING DIAG:  T17.908D (ICD-10-CM) - Chronic pulmonary aspiration, subsequent encounter  T17.900D (ICD-10-CM) - Silent aspiration, subsequent encounter  E44.0 (ICD-10-CM) - Moderate protein-calorie malnutrition  R13.10 (ICD-10-CM) - Dysphagia, unspecified type    THERAPY DIAG:  Dysphagia, oropharyngeal phase  Rationale for Evaluation and Treatment Rehabilitation  SUBJECTIVE:   PERTINENT HISTORY and DIAGNOSTIC  FINDINGS:  Pt is a 73 yo male w/ ACDF surgery 03/2023.  PMH: GERD(Not on a PPI per chart), Actinic keratosis (02/04/2021), Anemia, Anxiety, Asymptomatic LV dysfunction, BCC (basal cell carcinoma of skin) (08/05/2020), Berger's disease, CAD (coronary artery disease) (06/2005), resting tremor, Depression, Dilated aortic root (HCC), Hematuria, Hyperlipidemia, Hypothyroidism, Insomnia, Left ureteral stone, Orthostatic hypotension, OSA (obstructive sleep apnea), PVC's (premature ventricular contractions) (07/07/2016), Renal disorder, Rosacea, and Urticaria.  Pt is followed by Cardiology for Coronary Artery Stenosis currently; also followed for Migraines.     EGD in 2023 revealed Gastritis.      CT of Chest 10/2023: 1. Interim development of patchy consolidation and ground-glass   disease most evident at the right middle lobe and right lower lobes   with additional areas of mild ground-glass disease and scattered   clustered nodularity, findings are suspicious for multifocal   infection/pneumonia. Imaging follow-up to resolution is recommended.   2. Emphysema.   3. Aortic atherosclerosis.            Dysphagia - reported by pt as Chronic, ever since neck surgery in December 2024. Pt was seen in the emergency room s/p Fall, notes detailing that he slipped on soap in the shower, fell backwards on right side hit head and neck against shower seat, possible brief loss of consciousness, per chart.  ACDF 03/2023 per chart.  Since the neck surgery in 03/2023, he has noted food, liquids and pill getting stuck on the right side of my throat more.  I used to cough a lot more but not as much now.  I cough up what gets stuck..  He has lost significant weight per chart notes due to decreased eating.  Pt endorsed using his home O2 when he feels SOB.   MBSS 12/26/2023 Patient appears to present w/ Chronic, moderate-severe Pharyngeal phase dysphagia per this assessment today. It appeared there was some impact from changes in  the Cervical Esophagus s/p ACDF surgery(03/2023 per chart notes) including structural/tissue changes in/aournd the CP segment and cervical esophagus. Pt with intermittent silent aspiration of thin liquids.   Recommend continue current diet of easy to eat foods at home; thin liquids via cup- at this time. Oral care Before any oral intake. Swallow Strategies Recommended: No straws, moistened foods, SMALL bites/sips, Head Turn RIGHT w/ slight chin tuck, multiple swallows to aid clearing, AND strong Cough/throat clear b/t bites/sips and at end of meal.  D/t the Chronicity of pt's Pharyngeal phase Dysphagia w/ weight loss and pulmonary impact per recent Chest CT, pt and Family may need to discuss alternative means of nutrition/hydration as support w/ his Medical Team/PCP, while seeking swallow therapy.  Received ENT report dated 01/12/2024 Laryngoscopy revealed  Moderate vocal fold bowing bilaterally and moderate glottic gamp with phonation Impression/plan: Aspiration on MBSS and significant weight loss since ACDF last year. TVF shows moderate sized glottic gap on laryngoscoy but  mobile vocal folds bilaterally. Pooling in pyriform on MBSS but no significant pooling of secretions on laryngoscopy. I feel that between speech therpay and possibly bilateral injection laryngoplasty, hopefully his swallowing will significantly improve. Will make referral to Dr Brien at [facility].   Pt and his wife report appt with Dr Brien in January 2026.  10/10/205 PEG placed  01/22/2024 thru 01/24/2024 Hospitalized d/t fall and confusion CT of the chest on arrival showed multifocal pneumonia and possible free air. When reviewed further with interventional radiology team it was determined this was an appropriate amount of free air secondary to recent PEG tube placement. Patient was initiated on antibiotic therapy for pneumonia and improved significantly.   02/07/2024 CT Chest 1. Evolving multifocal inflammatory  infiltrates, progressive within the right upper lobe and improved within the lung bases bilaterally. Recommend appropriate clinical management and imaging follow-up per clinical course. 2. Mild emphysema. Given emphysema as an independent lung cancer risk factor, consider evaluation for a low-dose CT lung cancer screening program if the patient is 73 years old and otherwise eligible.   PAIN:  Are you having pain? No  FALLS: Has patient fallen in last 6 months?  See PT evaluation for details  LIVING ENVIRONMENT: Lives with: lives with their spouse Lives in: House/apartment  PLOF:  Level of assistance: Independent with ADLs, Independent with IADLs Employment: Retired   PATIENT GOALS  to improve swallow function, nutrition and hydration, decrease aspiration risks  SUBJECTIVE STATEMENT: Pt pleasant, reports wife is feeling better Pt accompanied by: self   OBJECTIVE:   TODAY'S TREATMENT:  Skilled treatment session focused on pt's dysphagia goals. SLP facilitated the session by providing the following interventions:       Pt reports continued stability in weight (114lb).     Respiratory Muscle Strengthening: Pt independently able to maneuver devices correctly. EMST 75 set to 45 cm H2O: Pt able to complete three sets of 8 reps independently.  IMST set to 30 cm H2O: Pt able to complete 3 sets of 8 reps independently.  Pharyngeal exercises: Masako: Pt able to complete 3 sets of 8 reps independently.  Head Extension Swallowing Exercise - Pt able to complete 3 sets of 8 repetitions independently. Pt observed with throat clear and cough on 3rd set. CTAR: Pt able to independently complete 3 sets of 8 reps. Pt observed with one instance of cough and throat clear.    PATIENT EDUCATION: Education details: ST POC Person educated: Patient and Spouse Education method: Chief Technology Officer Education comprehension: needs further education  HOME EXERCISE PROGRAM:     See  above   GOALS: Goals reviewed with patient? Yes  SHORT TERM GOALS: Target date: 10 sessions  Updated 02/28/2024  Updated 02/09/2024 With Mod I, pt will perform compensatory swallow strategies (chin tuck, multiple swallows, head turn) to eliminate s/s of aspiration when consuming least restrictive diet. Baseline: Goal status: INITIAL: moderate A: Progress made, fluctuating between supervision and independent cues  2.  With Min A, pt will complete (supraglottic, swallow, Mednelson maneuver, effortful swallow) to improve oral motor weakness, tongue base retraction, hyolarnygeal excursion, airway protection and/or clearance of the bolus through the pharynx. Baseline:  Goal status: INITIAL: progress made; moderate A: Progress made, fluctuating between supervision and independent cues  3. Pt will demonstrate overt s/s of aspiration when consuming ice chips.   Baseline: Goal status: INITIAL: no overt s/s of aspiration observed during today's session: Intermittent overt s/s observed   LONG TERM GOALS: Target date: 03/25/2024  Updated  02/28/2024  Updated: 02/09/2024 With Mod I, pt will demonstrate the ability to adequately self-monitor swallowing skills and perform appropriate compensatory techniques to reduce s/s of aspiration and to safely consume least restrictive diet.     Baseline:  Goal status: INITIAL: continues to be appropriate goal  2.  Pt will participate in repeat instrumental swallow evaluation to assess least restrictive diet.  Baseline:  Goal status: INITIAL: continues to be appropriate goal: Modified Barium scheduled for 11/21   ASSESSMENT:  CLINICAL IMPRESSION: Patient is a 73 y.o. male who was seen today for a clinical swallow re-evaluation d/t severe pharyngeal phase dysphagia following ACDF (03/2023).    See the above treatment note for details.   OBJECTIVE IMPAIRMENTS include dysphagia. These impairments are limiting patient from safety when swallowing. Factors  affecting potential to achieve goals and functional outcome are severity of impairments and malnutrition. Patient will benefit from skilled SLP services to address above impairments and improve overall function.  REHAB POTENTIAL: Good  PLAN: SLP FREQUENCY: 1-2x/week  SLP DURATION: 12 weeks  PLANNED INTERVENTIONS: Aspiration precaution training, Pharyngeal strengthening exercises, Diet toleration management , Oral motor exercises, SLP instruction and feedback, Compensatory strategies, and Patient/family education   Arrow Tomko B. Rubbie, M.S., CCC-SLP, Tree Surgeon Certified Brain Injury Specialist [facility]  [facility] Rehabilitation Services Office [phone] Ascom [phone] Fax [phone]

## 2024-03-19 NOTE — Therapy (Signed)
 OUTPATIENT PHYSICAL THERAPY TREATMENT   Patient Name: Shawn Lam MRN: [id number] DOB:1950/05/05, 73 y.o., male Today's Date: 03/19/2024  END OF SESSION:  PT End of Session - 03/19/24 0846     Visit Number 14    Number of Visits 27    Date for Recertification  04/30/24    Progress Note Due on Visit 20    PT Start Time 0847    PT Stop Time 0928    PT Time Calculation (min) 41 min    Equipment Utilized During Treatment Gait belt    Activity Tolerance Patient tolerated treatment well    Behavior During Therapy [facility] for tasks assessed/performed            Past Medical History:  Diagnosis Date   Actinic keratosis 02/04/2021   right lateral neck   Anemia    Anxiety    Asymptomatic LV dysfunction    50-55% by echo 06/2016   BCC (basal cell carcinoma of skin) 08/05/2020   left neck infra auricular - nodular pattern - treated with [facility] 09/17/2020   Berger's disease    CAD (coronary artery disease) 06/2005   PCI of LAD and RCA   Depression    Dilated aortic root    4cm at sinus of Valsalva   Hematuria    Hyperlipidemia    Hypothyroidism    Insomnia    Left ureteral stone    Orthostatic hypotension    negative tilt table   OSA (obstructive sleep apnea)    moderate with AHI 17/hr, uses CPAP nightly(01/15/19 - has Inspire implant)   PVC's (premature ventricular contractions) 07/07/2016   Renal disorder    PT IS SEEING DR. BETSEY- NEPHROLOGIST   Rosacea    Urticaria    Past Surgical History:  Procedure Laterality Date   ANTERIOR CERVICAL DECOMP/DISCECTOMY FUSION N/A 03/13/2023   Procedure: CERVICAL FOUR-FIVE, CERVICAL FIVE-SIX ANTERIOR CERVICAL DISCECTOMY/DECOMPRESSION FUSION;  Surgeon: Onetha Kuba, MD;  Location: [facility] OR;  Service: Neurosurgery;  Laterality: N/A;  3C   CARDIAC CATHETERIZATION  09/06/2013   [facility]   CARDIAC CATHETERIZATION     COLONOSCOPY WITH PROPOFOL  N/A 01/21/2019   Procedure: COLONOSCOPY WITH PROPOFOL ;  Surgeon: Jinny Carmine, MD;  Location: [facility]  SURGERY CN[facility];  Service: Endoscopy;  Laterality: N/A;  sleep apnea   COLONOSCOPY WITH PROPOFOL  N/A 10/19/2021   Procedure: COLONOSCOPY WITH PROPOFOL ;  Surgeon: Jinny Carmine, MD;  Location: [facility] ENDOSCOPY;  Service: Endoscopy;  Laterality: N/A;   CORONARY ANGIOPLASTY  2004   STENT PLACEMENT   CYSTOSCOPY WITH RETROGRADE PYELOGRAM, URETEROSCOPY AND STENT PLACEMENT Left 03/19/2014   Procedure: CYSTOSCOPY WITH RETROGRADE PYELOGRAM, URETEROSCOPY AND STENT PLACEMENT;  Surgeon: Mickey Ricardo KATHEE Alvaro, MD;  Location: [facility] ORS;  Service: Urology;  Laterality: Left;   CYSTOSCOPY WITH RETROGRADE PYELOGRAM, URETEROSCOPY AND STENT PLACEMENT Bilateral 05/20/2015   Procedure:  CYSTOSCOPY WITH BILATERAL RETROGRADE PYELOGRAM,RIGHT  DIAGNOSTIC URETEROSCOPY ,LEFT URETEROSCOPY WITH HOLMIUM LASER  AND BILATERAL  STENT PLACEMENT ;  Surgeon: Ricardo Alvaro, MD;  Location: [facility] ORS;  Service: Urology;  Laterality: Bilateral;   DRUG INDUCED ENDOSCOPY N/A 10/20/2017   Procedure: DRUG INDUCED SLEEP ENDOSCOPY;  Surgeon: Carlie Clark, MD;  Location: [facility];  Service: ENT;  Laterality: N/A;   EP IMPLANTABLE DEVICE N/A 05/13/2016   Procedure: Loop Recorder Insertion;  Surgeon: Elspeth JAYSON Sage, MD;  Location: [facility] INVASIVE CV LAB;  Service: Cardiovascular;  Laterality: N/A;   ESOPHAGOGASTRODUODENOSCOPY N/A 10/19/2021   Procedure: ESOPHAGOGASTRODUODENOSCOPY (EGD);  Surgeon: Jinny Carmine, MD;  Location: [facility] ENDOSCOPY;  Service: Endoscopy;  Laterality: N/A;   ESOPHAGOGASTRODUODENOSCOPY (EGD) WITH PROPOFOL  N/A 11/30/2018   Procedure: ESOPHAGOGASTRODUODENOSCOPY (EGD) WITH PROPOFOL ;  Surgeon: Therisa Bi, MD;  Location: [facility] ENDOSCOPY;  Service: Gastroenterology;  Laterality: N/A;   ESOPHAGOGASTRODUODENOSCOPY (EGD) WITH PROPOFOL  N/A 12/01/2018   Procedure: ESOPHAGOGASTRODUODENOSCOPY (EGD) WITH PROPOFOL ;  Surgeon: Jinny Carmine, MD;  Location: [facility] ENDOSCOPY;  Service: Endoscopy;  Laterality: N/A;   HOLMIUM LASER APPLICATION Bilateral  05/20/2015   Procedure: HOLMIUM LASER APPLICATION;  Surgeon: Ricardo Likens, MD;  Location: [facility] ORS;  Service: Urology;  Laterality: Bilateral;   IMPLIMENTATION OF A HYPOGLOSSAL NERVE STIMULATOR  12/08/2017   OSA    IR GASTROSTOMY TUBE MOD SED  01/18/2024   IR RADIOLOGIST EVAL & MGMT  02/13/2024   LEFT HEART CATHETERIZATION WITH CORONARY ANGIOGRAM N/A 09/06/2013   Procedure: LEFT HEART CATHETERIZATION WITH CORONARY ANGIOGRAM;  Surgeon: Victory LELON Claudene DOUGLAS, MD;  Location: [facility] CATH LAB;  Service: Cardiovascular;  Laterality: N/A;   LEFT KNEE CAP  SURGERY ABOUT 40 YRS AGO  ? 1970     STONE EXTRACTION WITH BASKET Left 03/19/2014   Procedure: STONE EXTRACTION WITH BASKET;  Surgeon: Mickey Ricardo KATHEE Likens, MD;  Location: [facility] ORS;  Service: Urology;  Laterality: Left;   TRANSURETHRAL RESECTION OF PROSTATE N/A 08/25/2021   Procedure: TRANSURETHRAL RESECTION OF THE PROSTATE (TURP);  Surgeon: Likens Ricardo, MD;  Location: [facility] ORS;  Service: Urology;  Laterality: N/A;  1 HR   Patient Active Problem List   Diagnosis Date Noted   S/P gastrostomy tube (G tube) placement, follow-up exam 02/18/2024   Skin infection at gastrostomy tube site (HCC) 02/06/2024   Protein-calorie malnutrition, severe 01/23/2024   Multifocal pneumonia 01/22/2024   Fall 01/03/2024   Hematoma of right hip 01/03/2024   Chronic pulmonary aspiration 12/29/2023   Silent aspiration 12/29/2023   Moderate protein-calorie malnutrition (weight for age 10-74% of standard) 12/29/2023   Bilateral leg weakness 12/29/2023   Chronic respiratory failure with hypoxia, on home oxygen  therapy (HCC) 12/29/2023   Chronic constipation 12/29/2023   Somnolence 12/29/2023   Frequent falls 12/29/2023   Feeding difficulties, unspecified 12/20/2023   Shortness of breath 12/20/2023   Abnormal CT of the chest 10/10/2023   Elevated blood sugar 06/30/2023   Carotid stenosis 05/11/2023   Spinal stenosis in cervical region 03/13/2023   Osteoarthritis of spine with  radiculopathy, cervical region 07/14/2022   Cervicogenic headache 11/30/2021   Chronic neck pain 11/30/2021   Weight loss, non-intentional    Dysphagia    Polyp of colon    Prostatic hyperplasia 08/25/2021   Chronic insomnia 03/19/2020   Peripheral neuropathy 03/19/2020   Iron deficiency anemia 01/06/2020   Allergic dermatitis 07/08/2019   Osteoarthritis of left knee 02/26/2019   History of colonic polyps    History of duodenal ulcer    CKD (chronic kidney disease), stage IIIa    Bilateral chronic knee pain 11/09/2018   Epistaxis 10/02/2018   Subjective tinnitus, bilateral 10/02/2018   Chronic pain of right thumb 02/22/2018   Acute bilateral thoracic back pain 01/23/2018   Osteoporosis without current pathological fracture 02/10/2017   Hypothyroidism 11/02/2016   Thoracic compression fracture (HCC) 09/06/2016   PVC's (premature ventricular contractions) 07/07/2016   Hypoalbuminemia 06/28/2016   IgA nephropathy 06/07/2016   Acute buttock pain 05/23/2016   MDD (major depressive disorder), recurrent episode, moderate (HCC) 11/20/2015   OSA (obstructive sleep apnea) 02/05/2014   Orthostatic hypotension 02/07/2013   Scotoma involving central area 01/29/2013   Optic  neuropathy 01/28/2013   HEMATURIA UNSPECIFIED 01/21/2010   HLD (hyperlipidemia) 07/05/2006   Generalized anxiety disorder 07/05/2006   CAD (coronary artery disease) 07/05/2006   GERD 07/05/2006    PCP: Dr. Greig Ring    REFERRING PROVIDER: Dr. Arley Helling   REFERRING DIAG: M54.6 (ICD-10-CM) - Acute bilateral thoracic back pain  THERAPY DIAG:  Abnormality of gait  Muscle weakness (generalized)  Balance disorder  Pain in thoracic spine  Rationale for Evaluation and Treatment: Rehabilitation  ONSET DATE: May 2025    SUBJECTIVE:                                                                                                                                                                                                          SUBJECTIVE STATEMENT  Pt reports he is doing well, no updates and had a good weekend. No dizziness noted this date.   PERTINENT HISTORY:   73yoM referred to physical therapy for ongoing neck pain following cervical fusion in May 2020 and newly developed thoracic pain from T12 compression fracture that occurred sometime in Spring of 2025 (associated radicular pain pattern). Pt has pain localized to upper traps and suboccipitals. Pt attempted DC of prednisone  and gabapentin  two weeks prior to evaluation here, severe decline in function due to pain, barely able to walk for two weeks. He describes feeling increased fatigue and weakness to the point where he struggled to walk. Pt also reported repeated falls in the setting of orthostatic hypotension with him experiencing syncope when he stands up from a seated position. He describes feeling a burning sensation that is also localized to his scapulae and that it occurs when he is doing UE activities like washing his car that is likely the radicular pain from thoracic vertebrae compression fracture.  PAIN: Are you having pain? 03/05/2024: 6/10 today in the neck  PRECAUTIONS: None Pt has thoracic compression fractures and is s/p cervical spine surgery   WEIGHT BEARING RESTRICTIONS: No  FALLS:  Has patient fallen in last 6 months? Yes. Number of falls 6, pt reports that falls are happening in the context of syncope when standing up from a seated position (Orthostatic Hypotension) , where he blacks out and falls. He is following up with his PCP, Dr. Karlene who is working with him on stabilizing his blood pressure and she has recently taken him off Gabapentin  to see if this helps. Pt reports that he does note an improvement in his symptoms with less light headedness since going off medication.   LIVING ENVIRONMENT: Lives with: lives with their  spouse Lives in: House/apartment Stairs: No Has following equipment at home: Single point cane and  Environmental Consultant - 4 wheeled  OCCUPATION: Retired     PLOF: Independent  PATIENT GOALS: Improve balance, decrease falls.       OBJECTIVE:   Note: Objective measures were completed at evaluation unless otherwise noted. Reassessment Measures 02/09/24: -eyes closed balance, normal stance: <5sec with retropulsion each time, ModA falls recovery  -narrow stance eyes open balance: <10sec with LOB, ModA falls recovery  -Long distance AMB assessment c SPC: 115ft with 2 DOE standing breaks ~15 seconds each, 15sec; crossover gait intermittently without LOB; -standing vitals taken at end of walk: stable and comparable to prior seated vitals.  -cervical rotation ROM: 50deg Rt, 53 degree left  -shoulder flexion MMT: 5/5 Rt, 4+/5 left; shoulder ABDCT MMT: 5/5 bilat  -ankle DF assessment: can perform >10x full range while leaning against wall (adequate for gait needs)  -sensory testing about ankles, lower legs: (feels intact, but pt reports constance N/T below ankle to his toes)  Standing BP assessment: 02/28/24 -124/59mmHg 95bpm standing @ 0 -122/48mmHg 90bpm standing @ 1 110/55mmHg 92bpm standing @ 2  TREATMENT DATE 03/19/24:   TA- To improve functional movements patterns for everyday tasks   Nustep level 4-9 rolling hills setting for B UE and LE reciprocal movement training and for LE strength and endurance. X 8 min. Check ins throughout to ensure appropriate response.   NMR: To facilitate reeducation of movement, balance, posture, coordination, and/or proprioception/kinesthetic sense.  1 LE on airex other on step 3 x 30 sec ea LE   Wobble board  x 30 sec a/p and  x 30 sec lateral orientation - reports lateral was more difficulty  -added basketball ball toss x 15 with lateral oriented wobble board and 3 x 15 a/p oriented wobble board (increased difficulty with latter)  Incline board heel raises with focus on maintaining balance at end of eccentric portion 3 x 12 reps, UE support allowed   Feet  on airex STS with 2KG ball toss anterior 2 x 10  Balance obstacle course x 3 laps airex, step trainer, red mat, hedgehog taps, hurdle step overs, and cone weaving  X another 3 laps but added 1 riser to step trainer and airex beam instead of mat - much higher difficulty reported   Unless otherwise stated, CGA was provided and gait belt donned in order to ensure pt safety   PATIENT EDUCATION:  Education details: Pt progressing so far with cardio and balance activity.     Person educated: Patient Education method: Medical Illustrator Education comprehension: verbalized understanding, returned demonstration, and verbal cues required  HOME EXERCISE PROGRAM: Access Code: [email or web address] URL: [email or web address] Date: 03/12/2024 Prepared by: Lonni Gainer  Exercises - Marching Near Counter  - 1 x daily - 7 x weekly - 2 sets - 10 reps - 3 sec hold - Single leg balance near counter   - 1 x daily - 7 x weekly - 3 sets - 30 second hold - Heel Toe Raises with Counter Support  - 1 x daily - 7 x weekly - 3 sets - 10 reps  ASSESSMENT:  CLINICAL IMPRESSION:   Patient arrived with good motivation for completion of pt activities. Pt challenged with endurance and balance based activities to improve balance responses and reactions. Pt challenged with more dynamic balance activities with good ability to progress a this time. Pt highly challenged with ant/post balance perturbations with ball toss on wobble board and  also noted a lot of his LOB at home are falling backwards.  Pt will continue to benefit from skilled physical therapy intervention to address impairments, improve QOL, and attain therapy goals.    OBJECTIVE IMPAIRMENTS: decreased balance, decreased ROM, decreased strength, hypomobility, impaired flexibility, impaired UE functional use, and pain.   ACTIVITY LIMITATIONS: carrying, lifting, standing, bathing, dressing, reach over head, and hygiene/grooming PARTICIPATION  LIMITATIONS: cleaning, laundry, shopping, community activity, and yard work PERSONAL FACTORS: Age and 3+ comorbidities: CAD, Depression, are also affecting patient's functional outcome.  REHAB POTENTIAL: Good CLINICAL DECISION MAKING: Stable/uncomplicated EVALUATION COMPLEXITY: Low   GOALS: Goals reviewed with patient? No  SHORT TERM GOALS: Target date: 01/03/2024  Patient will demonstrate undestanding of home exercise plan by performing exercises correctly with evidence of good carry over with min to no verbal or tactile cues .   Baseline: NT 12/25/23: Performing exercises independently  Goal status: ACHIEVED   2.  Patient will demonstrate understanding of how to modify exercises to decrease thoracic and cervical pain exacerbation for improved function and to decrease incidence of pain flare ups.  Baseline: Does not currently have strategy to manage this  Goal status: NOT MET      LONG TERM GOALS: Target date: 03/13/2024  Patient will show a statistically significant improvement in his neck function as evidence by >=7.5 pt decrease in her neck disability index score to better be able to move neck to improve visual field while driving to avoid accidents. Vernel et al, 2009) Baseline: 33/50 (66%); 02/09/24: 66% 03/01/2024: 50% Goal status: NOT MET     2.  Patient will increase cervical mobility by  >=5 degrees AROM for all planes of motion (flexion, extension, side bending, and rotation) for improved cervical function and evidence of pain reduction. Baseline: Cervical AROM Flex 30, Ext 30, Rot R/L 40/40, SB R/L 35/35; 02/09/24: Rt rotation 50 degrees, Left rotation: 53 degress  03/01/2024: Flex:  30*   Ext: 20*      Rot L: 32*     Rot R: 30*      SB L: 20*       SB R: 15* Goal status: MET    3.  Patient will improve shoulder and periscapular strength by 1/3 grade MMT (4- to 4) for improved cervical stability and UE function to carry out upper extremity activities like washing his car and  gardening without being limited by pain and discomfort.  Baseline: Shoulder Flex R/L 4/4, Shoulder Abd R/L 4/4; 10/31: shoulder flexion Rt: 5/5, left 4+/5; shoulder ABDCT 5/5 bilat  03/01/2024: Shld Flex R: 5 Shld Flex L: 4+ , Shld abd R: 4 +  Shld Abd L: 4 Goal status: MET    4.  Patient will reduce neck and thoracic pain to <=4/10 NRPS while performing UE activities like washing his car and doing yard work as evidence of improved cervical and thoracic function.  Baseline: 10/10 cervical paraspinal and rhomboid pain with UE activity; up to a 9/10 intermittently with activity   03/01/2024: 8/10 NPS UE activity Goal status: NOT MET    5. Patient (> 29 years old) will complete five times sit to stand test in < 15 seconds indicating an increased LE strength and improved balance. Baseline: 17.28s Goal status: INITIAL  6. Patient will increase Berg Balance score by > 6 points to demonstrate decreased fall risk during functional activities Baseline: 47/56 Goal status: INITIAL  7. Patient will increase 10 meter walk test to >1.71m/s as to improve gait  speed for better community ambulation and to reduce fall risk. Baseline: normal pace: 0.2m/s; fast pace: 0.975s Goal status: INITIAL  8. Patient will reduce timed up and go to <10 seconds to reduce fall risk and demonstrate improved transfer/gait ability. Baseline: 11.27s Goal status: INITIAL  9. Patient will decrease distance of by 147ft in order to increase cardiovascular endurance for improved community ambulation distance. Baseline: 916ft in 6 min w/ SPC Goal status: INITIAL  10. Pt will increase score on FGA by 4 points in order to increase functional ambulation and increase confidence in community environments. Baseline: 17/30 Goal status: INITIAL PLAN:  PT FREQUENCY: 1-2x/week PT DURATION: 8 weeks PLANNED INTERVENTIONS: [id number]- PT Re-evaluation, 97750- Physical Performance Testing, 97110-Therapeutic exercises, 97530- Therapeutic  activity, [id number]- Neuromuscular re-education, 97535- Self Care, [id number]- Manual therapy, [phone]- Gait training, [phone]- Canalith repositioning, [id number]- Aquatic Therapy, [phone]- Electrical stimulation (unattended), [phone]- Electrical stimulation (manual), [id number]- Traction (mechanical), 20560 (1-2 muscles), 20561 (3+ muscles)- Dry Needling, Patient/Family education, Balance training, Joint mobilization, Joint manipulation, Spinal manipulation, Spinal mobilization, Vestibular training, DME instructions, Cryotherapy, and Moist heat  PLAN FOR NEXT SESSION:   -continue to address cervical ROM/pain -address global weakness, deconditioning, and imbalance.   Note: Portions of this document were prepared using Dragon voice recognition software and although reviewed may contain unintentional dictation errors in syntax, grammar, or spelling.  Lonni KATHEE Gainer PT ,DPT Physical Therapist- [place]  [facility]   8:47 AM, 03/19/24

## 2024-03-19 NOTE — Telephone Encounter (Signed)
 Thank you so much for your response!  Shawn Lam is using the following feeding formula. [employer]  Isosource 1.5 cal Calorically dense Complete nutrition with fiber right now drinking  8.45 FL OZ 6 times per day with 50 ML of water  before and after each feeding  The home health group [facility]  dietician that is providing the feeds has not been terribly helpful in adjusting the foods.  Please let me know if you have a dietitian you typically work with for adjusting tube feeds.  His phosphorus was also recently slightly elevated, potassium has been normal.  Do you think  he needs a renal formula?

## 2024-03-21 ENCOUNTER — Encounter: Payer: Self-pay | Admitting: Pulmonary Disease

## 2024-03-21 ENCOUNTER — Ambulatory Visit: Admitting: Pulmonary Disease

## 2024-03-21 ENCOUNTER — Ambulatory Visit: Admitting: Speech Pathology

## 2024-03-21 ENCOUNTER — Ambulatory Visit: Admitting: Physical Therapy

## 2024-03-21 ENCOUNTER — Ambulatory Visit

## 2024-03-21 ENCOUNTER — Encounter: Admitting: Speech Pathology

## 2024-03-21 VITALS — BP 100/70 | HR 74 | Temp 97.7°F | Ht 72.0 in | Wt 120.2 lb

## 2024-03-21 DIAGNOSIS — D721 Eosinophilia, unspecified: Secondary | ICD-10-CM | POA: Diagnosis not present

## 2024-03-21 DIAGNOSIS — R0609 Other forms of dyspnea: Secondary | ICD-10-CM | POA: Diagnosis not present

## 2024-03-21 DIAGNOSIS — T17908D Unspecified foreign body in respiratory tract, part unspecified causing other injury, subsequent encounter: Secondary | ICD-10-CM

## 2024-03-21 DIAGNOSIS — Z931 Gastrostomy status: Secondary | ICD-10-CM | POA: Diagnosis not present

## 2024-03-21 DIAGNOSIS — R42 Dizziness and giddiness: Secondary | ICD-10-CM

## 2024-03-21 DIAGNOSIS — R1312 Dysphagia, oropharyngeal phase: Secondary | ICD-10-CM

## 2024-03-21 DIAGNOSIS — R9389 Abnormal findings on diagnostic imaging of other specified body structures: Secondary | ICD-10-CM

## 2024-03-21 DIAGNOSIS — M6281 Muscle weakness (generalized): Secondary | ICD-10-CM

## 2024-03-21 DIAGNOSIS — R0602 Shortness of breath: Secondary | ICD-10-CM

## 2024-03-21 DIAGNOSIS — R269 Unspecified abnormalities of gait and mobility: Secondary | ICD-10-CM

## 2024-03-21 DIAGNOSIS — R2689 Other abnormalities of gait and mobility: Secondary | ICD-10-CM

## 2024-03-21 LAB — NITRIC OXIDE: Nitric Oxide: 15

## 2024-03-21 NOTE — Progress Notes (Unsigned)
 Subjective:    Patient ID: Shawn Lam, male    DOB: 10-01-1950, 73 y.o.   MRN: [id number]  Patient Care Team: Avelina Greig BRAVO, MD as PCP - General Perla Evalene PARAS, MD as PCP - Cardiology (Cardiology) Fate Morna SAILOR, [facility] (Inactive) as Pharmacist (Pharmacist) Laurice Francis NOVAK, [place] (Optometry) Onetha Kuba, MD as Consulting Physician (Neurosurgery)  Chief Complaint  Patient presents with   Shortness of Breath    Persistent shortness of breath when he stands, from sitting for a while. Patient reports negative cardio workup.     BACKGROUND/INTERVAL:  HPI   Review of Systems A 10 point review of systems was performed and it is as noted above otherwise negative.   Patient Active Problem List   Diagnosis Date Noted   S/P gastrostomy tube (G tube) placement, follow-up exam 02/18/2024   Skin infection at gastrostomy tube site (HCC) 02/06/2024   Protein-calorie malnutrition, severe 01/23/2024   Multifocal pneumonia 01/22/2024   Fall 01/03/2024   Hematoma of right hip 01/03/2024   Chronic pulmonary aspiration 12/29/2023   Silent aspiration 12/29/2023   Moderate protein-calorie malnutrition (weight for age 43-74% of standard) 12/29/2023   Bilateral leg weakness 12/29/2023   Chronic respiratory failure with hypoxia, on home oxygen  therapy (HCC) 12/29/2023   Chronic constipation 12/29/2023   Somnolence 12/29/2023   Frequent falls 12/29/2023   Feeding difficulties, unspecified 12/20/2023   Shortness of breath 12/20/2023   Abnormal CT of the chest 10/10/2023   Elevated blood sugar 06/30/2023   Carotid stenosis 05/11/2023   Spinal stenosis in cervical region 03/13/2023   Osteoarthritis of spine with radiculopathy, cervical region 07/14/2022   Cervicogenic headache 11/30/2021   Chronic neck pain 11/30/2021   Weight loss, non-intentional    Dysphagia    Polyp of colon    Prostatic hyperplasia 08/25/2021   Chronic insomnia 03/19/2020   Peripheral neuropathy 03/19/2020    Iron deficiency anemia 01/06/2020   Allergic dermatitis 07/08/2019   Osteoarthritis of left knee 02/26/2019   History of colonic polyps    History of duodenal ulcer    CKD (chronic kidney disease), stage IIIa    Bilateral chronic knee pain 11/09/2018   Epistaxis 10/02/2018   Subjective tinnitus, bilateral 10/02/2018   Chronic pain of right thumb 02/22/2018   Acute bilateral thoracic back pain 01/23/2018   Osteoporosis without current pathological fracture 02/10/2017   Hypothyroidism 11/02/2016   Thoracic compression fracture (HCC) 09/06/2016   PVC's (premature ventricular contractions) 07/07/2016   Hypoalbuminemia 06/28/2016   IgA nephropathy 06/07/2016   Acute buttock pain 05/23/2016   MDD (major depressive disorder), recurrent episode, moderate (HCC) 11/20/2015   OSA (obstructive sleep apnea) 02/05/2014   Orthostatic hypotension 02/07/2013   Scotoma involving central area 01/29/2013   Optic neuropathy 01/28/2013   HEMATURIA UNSPECIFIED 01/21/2010   HLD (hyperlipidemia) 07/05/2006   Generalized anxiety disorder 07/05/2006   CAD (coronary artery disease) 07/05/2006   GERD 07/05/2006    Social History   Tobacco Use   Smoking status: Never   Smokeless tobacco: Never  Substance Use Topics   Alcohol use: Not Currently    Alcohol/week: 1.0 standard drink of alcohol    Types: 1 Cans of beer per week    Allergies[1]  Active Medications[2]  Immunization History  Administered Date(s) Administered   Fluad Quad(high Dose 65+) 12/04/2018, 01/01/2020   INFLUENZA, HIGH DOSE SEASONAL PF 01/04/2021   Influenza Split 01/04/2011, 01/27/2012   Influenza Whole 01/09/2008, 03/04/2009, 12/27/2009   Influenza,inj,Quad PF,6+ Mos 01/31/2013, 01/31/2014,  02/03/2015, 02/09/2016, 01/20/2017, 01/23/2018   Influenza-Unspecified 12/08/2021, 12/14/2022   PFIZER Comirnaty(Gray Top)Covid-19 Tri-Sucrose Vaccine 05/04/2020   PFIZER(Purple Top)SARS-COV-2 Vaccination 06/01/2019, 06/25/2019    Pneumococcal Conjugate-13 02/09/2016   Pneumococcal Polysaccharide-23 02/23/2017   Td 06/05/2007   Tdap 03/13/2019   Zoster Recombinant(Shingrix) 10/13/2021, 12/16/2021   Zoster, Live 01/25/2011        Objective:     Vitals:   03/21/24 1551  BP: 100/70  Pulse: 74  Temp: 97.7 F (36.5 C)  Height: 6' (1.829 m)  Weight: 120 lb 3.2 oz (54.5 kg)  SpO2: 100%  TempSrc: Temporal  BMI (Calculated): 16.3     SpO2: 100 %  GENERAL: HEAD: Normocephalic, atraumatic.  EYES: Pupils equal, round, reactive to light.  No scleral icterus.  MOUTH:  NECK: Supple. No thyromegaly. Trachea midline. No JVD.  No adenopathy. PULMONARY: Good air entry bilaterally.  No adventitious sounds. CARDIOVASCULAR: S1 and S2. Regular rate and rhythm.  ABDOMEN: MUSCULOSKELETAL: No joint deformity, no clubbing, no edema.  NEUROLOGIC:  SKIN: Intact,warm,dry. PSYCH:        Assessment & Plan:     ICD-10-CM   1. Shortness of breath  R06.02 Nitric oxide     CT CHEST WO CONTRAST    2. Abnormal CT of the chest  R93.89 CT CHEST WO CONTRAST    3. Chronic pulmonary aspiration, subsequent encounter  T17.908D     4. Gastrostomy present (HCC)  Z93.1     5. Eosinophilia, unspecified type  D72.10       Orders Placed This Encounter  Procedures   CT CHEST WO CONTRAST    Standing Status:   Future    Expected Date:   04/22/2024    Expiration Date:   03/21/2025    Preferred imaging location?:   [facility]   Nitric oxide     No orders of the defined types were placed in this encounter.     Advised if symptoms do not improve or worsen, to please contact office for sooner follow up or seek emergency care.    I spent xxx minutes of dedicated to the care of this patient on the date of this encounter to include pre-visit review of records, face-to-face time with the patient discussing conditions above, post visit ordering of testing, clinical documentation with the electronic health record, making  appropriate referrals as documented, and communicating necessary findings to members of the patients care team.     C. Leita Sanders, MD Advanced Bronchoscopy PCCM [facility]    *This note was generated using voice recognition software/Dragon and/or AI transcription program.  Despite best efforts to proofread, errors can occur which can change the meaning. Any transcriptional errors that result from this process are unintentional and may not be fully corrected at the time of dictation.    [1]  Allergies Allergen Reactions   Codeine Nausea Only and Nausea And Vomiting   Tape Other (See Comments)    Use only paper tape. Severe blistering and bruising with other tapes. No cloth tape.  [2]  Current Meds  Medication Sig   aspirin  81 MG chewable tablet Place 1 tablet (81 mg total) into feeding tube daily.   atorvastatin  (LIPITOR ) 80 MG tablet Place 1 tablet (80 mg total) into feeding tube daily.   baclofen  (LIORESAL ) 10 MG tablet Place 1 tablet (10 mg total) into feeding tube 2 (two) times daily.   buPROPion  (WELLBUTRIN  XL) 150 MG 24 hr tablet Take 2 tablets (300 mg total) by mouth daily.   busPIRone  (  BUSPAR ) 10 MG tablet Place 2 tablets (20 mg total) into feeding tube 3 (three) times daily.   Cholecalciferol  (VITAMIN D3) 50 MCG (2000 UT) TABS Place 1 tablet (2,000 Units total) into feeding tube daily.   clopidogrel  (PLAVIX ) 75 MG tablet Place 1 tablet (75 mg total) into feeding tube daily.   cyanocobalamin  1000 MCG tablet Place 1 tablet (1,000 mcg total) into feeding tube daily.   denosumab  (PROLIA ) 60 MG/ML SOSY injection Inject 60 mg into the skin every 6 (six) months. Do not send RX, order this from the office   DULoxetine  (CYMBALTA ) 30 MG capsule TAKE THREE CAPSULES BY MOUTH DAILY   Erenumab-aooe (AIMOVIG) 140 MG/ML SOAJ 140 mg every 28 (twenty-eight) days.   finasteride  (PROSCAR ) 5 MG tablet Take 5 mg by mouth daily.   levothyroxine  (SYNTHROID ) 88 MCG tablet  Place 1 tablet (88 mcg total) into feeding tube daily before breakfast.   metoprolol  tartrate (LOPRESSOR ) 25 mg/10 mL SUSP Place 4 mLs (10 mg total) into feeding tube 2 (two) times daily.   midodrine  (PROAMATINE ) 5 MG tablet Take 1 tablet (5 mg total) by mouth 3 (three) times daily as needed. For Systolic Blood Pressure (Top number) of less than 115   nitroGLYCERIN  (NITROSTAT ) 0.4 MG SL tablet Place 1 tablet (0.4 mg total) under the tongue every 5 (five) minutes as needed for chest pain.   Nutritional Supplements (FEEDING SUPPLEMENT, OSMOLITE 1.5 CAL,) LIQD Place 120 mLs into feeding tube 6 (six) times daily.   triamcinolone  cream (KENALOG ) 0.1 % APPLY 2 TOPICALLY DAILY , DO NOT APPLY ON DACE , GROIN AND UNDERARMS.   Water  For Irrigation, Sterile (FREE WATER ) SOLN Place 100 mLs into feeding tube 6 (six) times daily.   zolpidem  (AMBIEN ) 10 MG tablet TAKE 1 TABLET BY MOUTH EVERY NIGHT AT BEDTIME AS NEEDED FOR SLEEP   zonisamide  (ZONEGRAN ) 25 MG capsule Take 1 capsule (25 mg total) by mouth daily.

## 2024-03-21 NOTE — Patient Instructions (Addendum)
 VISIT SUMMARY:  Today, we discussed your shortness of breath and occasional dizziness when standing, which are likely due to deconditioning and involvement of the autonomic nervous system. Your oxygen  levels are generally good, and your lung function tests are normal, though there is some weakness. We also reviewed your elevated eosinophil levels, which may be related to an allergic reaction, and noted that your airway inflammation is low.  YOUR PLAN:  -DYSPNEA DUE TO DECONDITIONING: Your shortness of breath when standing or after sitting for a long time is likely due to deconditioning and involvement of the autonomic nervous system, which controls involuntary actions like heart rate and blood pressure. To help improve your symptoms, continue with physical therapy focusing on upper body strengthening, proceed with a cardiac PET scan to assess your heart function, and repeat a CT scan in early January to monitor your progress. Pulmonary rehabilitation is also encouraged to improve muscle strength.  -EOSINOPHILIA: Eosinophilia means you have elevated levels of eosinophils, a type of white blood cell that can indicate an allergic reaction. Although your airway inflammation is low, we will continue to monitor your eosinophil levels in future blood tests and evaluate for potential environmental allergens or nutritional factors that might be contributing to this condition.  We have ordered some blood test to evaluate your elevated eosinophils.  INSTRUCTIONS:  Please continue with your physical therapy and pulmonary rehabilitation. Schedule a cardiac PET scan to assess your heart function and plan for a repeat CT scan in early January. We will also monitor your eosinophil levels in future blood tests and evaluate for potential allergens or nutritional factors.

## 2024-03-21 NOTE — Therapy (Signed)
 OUTPATIENT SPEECH LANGUAGE PATHOLOGY  SWALLOW TREATMENT     Patient Name: Shawn Lam MRN: [id number] DOB:Aug 24, 1950, 73 y.o., male Today's Date: 03/21/2024  PCP: Greig Ring, MD REFERRING PROVIDER: Greig Ring, MD    End of Session - 03/21/24 0934     Visit Number 16    Number of Visits 25    Date for Recertification  03/25/24    Authorization Type Medicare Part A    Progress Note Due on Visit 20    SLP Start Time 0930    SLP Stop Time  1000    SLP Time Calculation (min) 30 min    Activity Tolerance Patient tolerated treatment well          Past Medical History:  Diagnosis Date   Actinic keratosis 02/04/2021   right lateral neck   Anemia    Anxiety    Asymptomatic LV dysfunction    50-55% by echo 06/2016   BCC (basal cell carcinoma of skin) 08/05/2020   left neck infra auricular - nodular pattern - treated with [facility] 09/17/2020   Berger's disease    CAD (coronary artery disease) 06/2005   PCI of LAD and RCA   Depression    Dilated aortic root    4cm at sinus of Valsalva   Hematuria    Hyperlipidemia    Hypothyroidism    Insomnia    Left ureteral stone    Orthostatic hypotension    negative tilt table   OSA (obstructive sleep apnea)    moderate with AHI 17/hr, uses CPAP nightly(01/15/19 - has Inspire implant)   PVC's (premature ventricular contractions) 07/07/2016   Renal disorder    PT IS SEEING DR. BETSEY- NEPHROLOGIST   Rosacea    Urticaria    Past Surgical History:  Procedure Laterality Date   ANTERIOR CERVICAL DECOMP/DISCECTOMY FUSION N/A 03/13/2023   Procedure: CERVICAL FOUR-FIVE, CERVICAL FIVE-SIX ANTERIOR CERVICAL DISCECTOMY/DECOMPRESSION FUSION;  Surgeon: Onetha Kuba, MD;  Location: [facility] OR;  Service: Neurosurgery;  Laterality: N/A;  3C   CARDIAC CATHETERIZATION  09/06/2013   [facility]   CARDIAC CATHETERIZATION     COLONOSCOPY WITH PROPOFOL  N/A 01/21/2019   Procedure: COLONOSCOPY WITH PROPOFOL ;  Surgeon: Jinny Carmine, MD;  Location: [facility] SURGERY  [facility];  Service: Endoscopy;  Laterality: N/A;  sleep apnea   COLONOSCOPY WITH PROPOFOL  N/A 10/19/2021   Procedure: COLONOSCOPY WITH PROPOFOL ;  Surgeon: Jinny Carmine, MD;  Location: [facility] ENDOSCOPY;  Service: Endoscopy;  Laterality: N/A;   CORONARY ANGIOPLASTY  2004   STENT PLACEMENT   CYSTOSCOPY WITH RETROGRADE PYELOGRAM, URETEROSCOPY AND STENT PLACEMENT Left 03/19/2014   Procedure: CYSTOSCOPY WITH RETROGRADE PYELOGRAM, URETEROSCOPY AND STENT PLACEMENT;  Surgeon: Mickey Ricardo KATHEE Alvaro, MD;  Location: [facility] ORS;  Service: Urology;  Laterality: Left;   CYSTOSCOPY WITH RETROGRADE PYELOGRAM, URETEROSCOPY AND STENT PLACEMENT Bilateral 05/20/2015   Procedure:  CYSTOSCOPY WITH BILATERAL RETROGRADE PYELOGRAM,RIGHT  DIAGNOSTIC URETEROSCOPY ,LEFT URETEROSCOPY WITH HOLMIUM LASER  AND BILATERAL  STENT PLACEMENT ;  Surgeon: Ricardo Alvaro, MD;  Location: [facility] ORS;  Service: Urology;  Laterality: Bilateral;   DRUG INDUCED ENDOSCOPY N/A 10/20/2017   Procedure: DRUG INDUCED SLEEP ENDOSCOPY;  Surgeon: Carlie Clark, MD;  Location: [facility];  Service: ENT;  Laterality: N/A;   EP IMPLANTABLE DEVICE N/A 05/13/2016   Procedure: Loop Recorder Insertion;  Surgeon: Elspeth JAYSON Sage, MD;  Location: [facility] INVASIVE CV LAB;  Service: Cardiovascular;  Laterality: N/A;   ESOPHAGOGASTRODUODENOSCOPY N/A 10/19/2021   Procedure: ESOPHAGOGASTRODUODENOSCOPY (EGD);  Surgeon: Jinny Carmine, MD;  Location: [facility] ENDOSCOPY;  Service: Endoscopy;  Laterality: N/A;   ESOPHAGOGASTRODUODENOSCOPY (EGD) WITH PROPOFOL  N/A 11/30/2018   Procedure: ESOPHAGOGASTRODUODENOSCOPY (EGD) WITH PROPOFOL ;  Surgeon: Therisa Bi, MD;  Location: [facility] ENDOSCOPY;  Service: Gastroenterology;  Laterality: N/A;   ESOPHAGOGASTRODUODENOSCOPY (EGD) WITH PROPOFOL  N/A 12/01/2018   Procedure: ESOPHAGOGASTRODUODENOSCOPY (EGD) WITH PROPOFOL ;  Surgeon: Jinny Carmine, MD;  Location: [facility] ENDOSCOPY;  Service: Endoscopy;  Laterality: N/A;   HOLMIUM LASER APPLICATION Bilateral 05/20/2015    Procedure: HOLMIUM LASER APPLICATION;  Surgeon: Ricardo Likens, MD;  Location: [facility];  Service: Urology;  Laterality: Bilateral;   IMPLIMENTATION OF A HYPOGLOSSAL NERVE STIMULATOR  12/08/2017   OSA    IR GASTROSTOMY TUBE MOD SED  01/18/2024   IR RADIOLOGIST EVAL & MGMT  02/13/2024   LEFT HEART CATHETERIZATION WITH CORONARY ANGIOGRAM N/A 09/06/2013   Procedure: LEFT HEART CATHETERIZATION WITH CORONARY ANGIOGRAM;  Surgeon: Victory LELON Claudene DOUGLAS, MD;  Location: [facility] CATH LAB;  Service: Cardiovascular;  Laterality: N/A;   LEFT KNEE CAP  SURGERY ABOUT 40 YRS AGO  ? 1970     STONE EXTRACTION WITH BASKET Left 03/19/2014   Procedure: STONE EXTRACTION WITH BASKET;  Surgeon: Mickey Ricardo KATHEE Likens, MD;  Location: [facility]S;  Service: Urology;  Laterality: Left;   TRANSURETHRAL RESECTION OF PROSTATE N/A 08/25/2021   Procedure: TRANSURETHRAL RESECTION OF THE PROSTATE (TURP);  Surgeon: Likens Ricardo, MD;  Location: [facility];  Service: Urology;  Laterality: N/A;  1 HR   Patient Active Problem List   Diagnosis Date Noted   S/P gastrostomy tube (G tube) placement, follow-up exam 02/18/2024   Skin infection at gastrostomy tube site (HCC) 02/06/2024   Protein-calorie malnutrition, severe 01/23/2024   Multifocal pneumonia 01/22/2024   Fall 01/03/2024   Hematoma of right hip 01/03/2024   Chronic pulmonary aspiration 12/29/2023   Silent aspiration 12/29/2023   Moderate protein-calorie malnutrition (weight for age 17-74% of standard) 12/29/2023   Bilateral leg weakness 12/29/2023   Chronic respiratory failure with hypoxia, on home oxygen  therapy (HCC) 12/29/2023   Chronic constipation 12/29/2023   Somnolence 12/29/2023   Frequent falls 12/29/2023   Feeding difficulties, unspecified 12/20/2023   Shortness of breath 12/20/2023   Abnormal CT of the chest 10/10/2023   Elevated blood sugar 06/30/2023   Carotid stenosis 05/11/2023   Spinal stenosis in cervical region 03/13/2023   Osteoarthritis of spine with  radiculopathy, cervical region 07/14/2022   Cervicogenic headache 11/30/2021   Chronic neck pain 11/30/2021   Weight loss, non-intentional    Dysphagia    Polyp of colon    Prostatic hyperplasia 08/25/2021   Chronic insomnia 03/19/2020   Peripheral neuropathy 03/19/2020   Iron deficiency anemia 01/06/2020   Allergic dermatitis 07/08/2019   Osteoarthritis of left knee 02/26/2019   History of colonic polyps    History of duodenal ulcer    CKD (chronic kidney disease), stage IIIa    Bilateral chronic knee pain 11/09/2018   Epistaxis 10/02/2018   Subjective tinnitus, bilateral 10/02/2018   Chronic pain of right thumb 02/22/2018   Acute bilateral thoracic back pain 01/23/2018   Osteoporosis without current pathological fracture 02/10/2017   Hypothyroidism 11/02/2016   Thoracic compression fracture (HCC) 09/06/2016   PVC's (premature ventricular contractions) 07/07/2016   Hypoalbuminemia 06/28/2016   IgA nephropathy 06/07/2016   Acute buttock pain 05/23/2016   MDD (major depressive disorder), recurrent episode, moderate (HCC) 11/20/2015   OSA (obstructive sleep apnea) 02/05/2014   Orthostatic hypotension 02/07/2013   Scotoma involving central area 01/29/2013   Optic  neuropathy 01/28/2013   HEMATURIA UNSPECIFIED 01/21/2010   HLD (hyperlipidemia) 07/05/2006   Generalized anxiety disorder 07/05/2006   CAD (coronary artery disease) 07/05/2006   GERD 07/05/2006    ONSET DATE: 03/2023 following ACDF; Date of referral 12/28/2023: date of this referral 02/01/2024  REFERRING DIAG:  T17.908D (ICD-10-CM) - Chronic pulmonary aspiration, subsequent encounter  T17.900D (ICD-10-CM) - Silent aspiration, subsequent encounter  E44.0 (ICD-10-CM) - Moderate protein-calorie malnutrition  R13.10 (ICD-10-CM) - Dysphagia, unspecified type    THERAPY DIAG:  Dysphagia, oropharyngeal phase  Rationale for Evaluation and Treatment Rehabilitation  SUBJECTIVE:   PERTINENT HISTORY and DIAGNOSTIC  FINDINGS:  Pt is a 73 yo male w/ ACDF surgery 03/2023.  PMH: GERD(Not on a PPI per chart), Actinic keratosis (02/04/2021), Anemia, Anxiety, Asymptomatic LV dysfunction, BCC (basal cell carcinoma of skin) (08/05/2020), Berger's disease, CAD (coronary artery disease) (06/2005), resting tremor, Depression, Dilated aortic root (HCC), Hematuria, Hyperlipidemia, Hypothyroidism, Insomnia, Left ureteral stone, Orthostatic hypotension, OSA (obstructive sleep apnea), PVC's (premature ventricular contractions) (07/07/2016), Renal disorder, Rosacea, and Urticaria.  Pt is followed by Cardiology for Coronary Artery Stenosis currently; also followed for Migraines.     EGD in 2023 revealed Gastritis.      CT of Chest 10/2023: 1. Interim development of patchy consolidation and ground-glass   disease most evident at the right middle lobe and right lower lobes   with additional areas of mild ground-glass disease and scattered   clustered nodularity, findings are suspicious for multifocal   infection/pneumonia. Imaging follow-up to resolution is recommended.   2. Emphysema.   3. Aortic atherosclerosis.            Dysphagia - reported by pt as Chronic, ever since neck surgery in December 2024. Pt was seen in the emergency room s/p Fall, notes detailing that he slipped on soap in the shower, fell backwards on right side hit head and neck against shower seat, possible brief loss of consciousness, per chart.  ACDF 03/2023 per chart.  Since the neck surgery in 03/2023, he has noted food, liquids and pill getting stuck on the right side of my throat more.  I used to cough a lot more but not as much now.  I cough up what gets stuck..  He has lost significant weight per chart notes due to decreased eating.  Pt endorsed using his home O2 when he feels SOB.   MBSS 12/26/2023 Patient appears to present w/ Chronic, moderate-severe Pharyngeal phase dysphagia per this assessment today. It appeared there was some impact from changes in  the Cervical Esophagus s/p ACDF surgery(03/2023 per chart notes) including structural/tissue changes in/aournd the CP segment and cervical esophagus. Pt with intermittent silent aspiration of thin liquids.   Recommend continue current diet of easy to eat foods at home; thin liquids via cup- at this time. Oral care Before any oral intake. Swallow Strategies Recommended: No straws, moistened foods, SMALL bites/sips, Head Turn RIGHT w/ slight chin tuck, multiple swallows to aid clearing, AND strong Cough/throat clear b/t bites/sips and at end of meal.  D/t the Chronicity of pt's Pharyngeal phase Dysphagia w/ weight loss and pulmonary impact per recent Chest CT, pt and Family may need to discuss alternative means of nutrition/hydration as support w/ his Medical Team/PCP, while seeking swallow therapy.  Received ENT report dated 01/12/2024 Laryngoscopy revealed  Moderate vocal fold bowing bilaterally and moderate glottic gamp with phonation Impression/plan: Aspiration on MBSS and significant weight loss since ACDF last year. TVF shows moderate sized glottic gap on laryngoscoy but  mobile vocal folds bilaterally. Pooling in pyriform on MBSS but no significant pooling of secretions on laryngoscopy. I feel that between speech therpay and possibly bilateral injection laryngoplasty, hopefully his swallowing will significantly improve. Will make referral to Dr Brien at [facility].   Pt and his wife report appt with Dr Brien in January 2026.  10/10/205 PEG placed  01/22/2024 thru 01/24/2024 Hospitalized d/t fall and confusion CT of the chest on arrival showed multifocal pneumonia and possible free air. When reviewed further with interventional radiology team it was determined this was an appropriate amount of free air secondary to recent PEG tube placement. Patient was initiated on antibiotic therapy for pneumonia and improved significantly.   02/07/2024 CT Chest 1. Evolving multifocal inflammatory  infiltrates, progressive within the right upper lobe and improved within the lung bases bilaterally. Recommend appropriate clinical management and imaging follow-up per clinical course. 2. Mild emphysema. Given emphysema as an independent lung cancer risk factor, consider evaluation for a low-dose CT lung cancer screening program if the patient is 73 years old and otherwise eligible.   PAIN:  Are you having pain? No  FALLS: Has patient fallen in last 6 months?  See PT evaluation for details  LIVING ENVIRONMENT: Lives with: lives with their spouse Lives in: House/apartment  PLOF:  Level of assistance: Independent with ADLs, Independent with IADLs Employment: Retired   PATIENT GOALS  to improve swallow function, nutrition and hydration, decrease aspiration risks  SUBJECTIVE STATEMENT: Pt pleasant, reports that he and his wife have called [employer] Pt accompanied by: self   OBJECTIVE:   TODAY'S TREATMENT:  Skilled treatment session focused on pt's dysphagia goals. SLP facilitated the session by providing the following interventions:       Pt mentioned we did some calling to [employer], they are supposed to call [employer] and call us  back.    Respiratory Muscle Strengthening: Pt independently able to maneuver devices correctly. EMST 75 set to 55 cm H2O: Pt able to complete three sets of 6 reps independently.  IMST set to 55 cm H2O: Pt able to complete 3 sets of 4 reps independently.  Pharyngeal exercises: Masako: Pt able to complete 3 sets of 8 reps independently.  Head Extension Swallowing Exercise - Pt able to complete 3 sets of 8 repetitions independently.  CTAR: Pt able to independently complete 3 sets of 8 reps.   PO consumption: Skilled observation was provided of pt consuming thin liquids via cup. Pt with no overt s/s of aspiration (confirmed silent aspiration on most recent MBSS). Pt does report increased coughing at home when consuming water .     PATIENT  EDUCATION: Education details: as above Person educated: Patient and Spouse Education method: Chief Technology Officer Education comprehension: needs further education  HOME EXERCISE PROGRAM:     See above   GOALS: Goals reviewed with patient? Yes  SHORT TERM GOALS: Target date: 10 sessions  Updated 02/28/2024  Updated 02/09/2024 With Mod I, pt will perform compensatory swallow strategies (chin tuck, multiple swallows, head turn) to eliminate s/s of aspiration when consuming least restrictive diet. Baseline: Goal status: INITIAL: moderate A: Progress made, fluctuating between supervision and independent cues  2.  With Min A, pt will complete (supraglottic, swallow, Mednelson maneuver, effortful swallow) to improve oral motor weakness, tongue base retraction, hyolarnygeal excursion, airway protection and/or clearance of the bolus through the pharynx. Baseline:  Goal status: INITIAL: progress made; moderate A: Progress made, fluctuating between supervision and independent cues  3. Pt will demonstrate overt s/s  of aspiration when consuming ice chips.   Baseline: Goal status: INITIAL: no overt s/s of aspiration observed during today's session: Intermittent overt s/s observed   LONG TERM GOALS: Target date: 03/25/2024  Updated 02/28/2024  Updated: 02/09/2024 With Mod I, pt will demonstrate the ability to adequately self-monitor swallowing skills and perform appropriate compensatory techniques to reduce s/s of aspiration and to safely consume least restrictive diet.     Baseline:  Goal status: INITIAL: continues to be appropriate goal  2.  Pt will participate in repeat instrumental swallow evaluation to assess least restrictive diet.  Baseline:  Goal status: INITIAL: continues to be appropriate goal: Modified Barium scheduled for 11/21   ASSESSMENT:  CLINICAL IMPRESSION: Patient is a 73 y.o. male who was seen today for a clinical swallow re-evaluation d/t severe pharyngeal phase  dysphagia following ACDF (03/2023).    See the above treatment note for details.   OBJECTIVE IMPAIRMENTS include dysphagia. These impairments are limiting patient from safety when swallowing. Factors affecting potential to achieve goals and functional outcome are severity of impairments and malnutrition. Patient will benefit from skilled SLP services to address above impairments and improve overall function.  REHAB POTENTIAL: Good  PLAN: SLP FREQUENCY: 1-2x/week  SLP DURATION: 12 weeks  PLANNED INTERVENTIONS: Aspiration precaution training, Pharyngeal strengthening exercises, Diet toleration management , Oral motor exercises, SLP instruction and feedback, Compensatory strategies, and Patient/family education   Nakaya Mishkin B. Rubbie, M.S., CCC-SLP, Tree Surgeon Certified Brain Injury Specialist [facility]  [facility] Office [phone] Ascom [phone] Fax [phone]

## 2024-03-21 NOTE — Therapy (Signed)
 OUTPATIENT PHYSICAL THERAPY TREATMENT   Patient Name: Shawn Lam MRN: [id number] DOB:22-Jan-1951, 73 y.o., male Today's Date: 03/21/2024  END OF SESSION:  PT End of Session - 03/21/24 0851     Visit Number 15    Number of Visits 27    Date for Recertification  04/30/24    Progress Note Due on Visit 20    PT Start Time 0848    PT Stop Time 0929    PT Time Calculation (min) 41 min    Equipment Utilized During Treatment Gait belt    Activity Tolerance Patient tolerated treatment well    Behavior During Therapy [facility] for tasks assessed/performed            Past Medical History:  Diagnosis Date   Actinic keratosis 02/04/2021   right lateral neck   Anemia    Anxiety    Asymptomatic LV dysfunction    50-55% by echo 06/2016   BCC (basal cell carcinoma of skin) 08/05/2020   left neck infra auricular - nodular pattern - treated with [facility] 09/17/2020   Berger's disease    CAD (coronary artery disease) 06/2005   PCI of LAD and RCA   Depression    Dilated aortic root    4cm at sinus of Valsalva   Hematuria    Hyperlipidemia    Hypothyroidism    Insomnia    Left ureteral stone    Orthostatic hypotension    negative tilt table   OSA (obstructive sleep apnea)    moderate with AHI 17/hr, uses CPAP nightly(01/15/19 - has Inspire implant)   PVC's (premature ventricular contractions) 07/07/2016   Renal disorder    PT IS SEEING DR. BETSEY- NEPHROLOGIST   Rosacea    Urticaria    Past Surgical History:  Procedure Laterality Date   ANTERIOR CERVICAL DECOMP/DISCECTOMY FUSION N/A 03/13/2023   Procedure: CERVICAL FOUR-FIVE, CERVICAL FIVE-SIX ANTERIOR CERVICAL DISCECTOMY/DECOMPRESSION FUSION;  Surgeon: Onetha Kuba, MD;  Location: [facility] OR;  Service: Neurosurgery;  Laterality: N/A;  3C   CARDIAC CATHETERIZATION  09/06/2013   [facility]   CARDIAC CATHETERIZATION     COLONOSCOPY WITH PROPOFOL  N/A 01/21/2019   Procedure: COLONOSCOPY WITH PROPOFOL ;  Surgeon: Jinny Carmine, MD;  Location: [facility];  Service: Endoscopy;  Laterality: N/A;  sleep apnea   COLONOSCOPY WITH PROPOFOL  N/A 10/19/2021   Procedure: COLONOSCOPY WITH PROPOFOL ;  Surgeon: Jinny Carmine, MD;  Location: [facility] ENDOSCOPY;  Service: Endoscopy;  Laterality: N/A;   CORONARY ANGIOPLASTY  2004   STENT PLACEMENT   CYSTOSCOPY WITH RETROGRADE PYELOGRAM, URETEROSCOPY AND STENT PLACEMENT Left 03/19/2014   Procedure: CYSTOSCOPY WITH RETROGRADE PYELOGRAM, URETEROSCOPY AND STENT PLACEMENT;  Surgeon: Mickey Ricardo KATHEE Alvaro, MD;  Location: [facility] ORS;  Service: Urology;  Laterality: Left;   CYSTOSCOPY WITH RETROGRADE PYELOGRAM, URETEROSCOPY AND STENT PLACEMENT Bilateral 05/20/2015   Procedure:  CYSTOSCOPY WITH BILATERAL RETROGRADE PYELOGRAM,RIGHT  DIAGNOSTIC URETEROSCOPY ,LEFT URETEROSCOPY WITH HOLMIUM LASER  AND BILATERAL  STENT PLACEMENT ;  Surgeon: Ricardo Alvaro, MD;  Location: [facility] ORS;  Service: Urology;  Laterality: Bilateral;   DRUG INDUCED ENDOSCOPY N/A 10/20/2017   Procedure: DRUG INDUCED SLEEP ENDOSCOPY;  Surgeon: Carlie Clark, MD;  Location: [facility];  Service: ENT;  Laterality: N/A;   EP IMPLANTABLE DEVICE N/A 05/13/2016   Procedure: Loop Recorder Insertion;  Surgeon: Elspeth JAYSON Sage, MD;  Location: [facility] INVASIVE CV LAB;  Service: Cardiovascular;  Laterality: N/A;   ESOPHAGOGASTRODUODENOSCOPY N/A 10/19/2021   Procedure: ESOPHAGOGASTRODUODENOSCOPY (EGD);  Surgeon: Jinny Carmine, MD;  Location: [facility] ENDOSCOPY;  Service: Endoscopy;  Laterality: N/A;   ESOPHAGOGASTRODUODENOSCOPY (EGD) WITH PROPOFOL  N/A 11/30/2018   Procedure: ESOPHAGOGASTRODUODENOSCOPY (EGD) WITH PROPOFOL ;  Surgeon: Therisa Bi, MD;  Location: [facility] ENDOSCOPY;  Service: Gastroenterology;  Laterality: N/A;   ESOPHAGOGASTRODUODENOSCOPY (EGD) WITH PROPOFOL  N/A 12/01/2018   Procedure: ESOPHAGOGASTRODUODENOSCOPY (EGD) WITH PROPOFOL ;  Surgeon: Jinny Carmine, MD;  Location: [facility] ENDOSCOPY;  Service: Endoscopy;  Laterality: N/A;   HOLMIUM LASER APPLICATION Bilateral  05/20/2015   Procedure: HOLMIUM LASER APPLICATION;  Surgeon: Ricardo Likens, MD;  Location: [facility];  Service: Urology;  Laterality: Bilateral;   IMPLIMENTATION OF A HYPOGLOSSAL NERVE STIMULATOR  12/08/2017   OSA    IR GASTROSTOMY TUBE MOD SED  01/18/2024   IR RADIOLOGIST EVAL & MGMT  02/13/2024   LEFT HEART CATHETERIZATION WITH CORONARY ANGIOGRAM N/A 09/06/2013   Procedure: LEFT HEART CATHETERIZATION WITH CORONARY ANGIOGRAM;  Surgeon: Victory LELON Claudene DOUGLAS, MD;  Location: [facility] CATH LAB;  Service: Cardiovascular;  Laterality: N/A;   LEFT KNEE CAP  SURGERY ABOUT 40 YRS AGO  ? 1970     STONE EXTRACTION WITH BASKET Left 03/19/2014   Procedure: STONE EXTRACTION WITH BASKET;  Surgeon: Mickey Ricardo KATHEE Likens, MD;  Location: [facility];  Service: Urology;  Laterality: Left;   TRANSURETHRAL RESECTION OF PROSTATE N/A 08/25/2021   Procedure: TRANSURETHRAL RESECTION OF THE PROSTATE (TURP);  Surgeon: Likens Ricardo, MD;  Location: [facility];  Service: Urology;  Laterality: N/A;  1 HR   Patient Active Problem List   Diagnosis Date Noted   S/P gastrostomy tube (G tube) placement, follow-up exam 02/18/2024   Skin infection at gastrostomy tube site (HCC) 02/06/2024   Protein-calorie malnutrition, severe 01/23/2024   Multifocal pneumonia 01/22/2024   Fall 01/03/2024   Hematoma of right hip 01/03/2024   Chronic pulmonary aspiration 12/29/2023   Silent aspiration 12/29/2023   Moderate protein-calorie malnutrition (weight for age 3-74% of standard) 12/29/2023   Bilateral leg weakness 12/29/2023   Chronic respiratory failure with hypoxia, on home oxygen  therapy (HCC) 12/29/2023   Chronic constipation 12/29/2023   Somnolence 12/29/2023   Frequent falls 12/29/2023   Feeding difficulties, unspecified 12/20/2023   Shortness of breath 12/20/2023   Abnormal CT of the chest 10/10/2023   Elevated blood sugar 06/30/2023   Carotid stenosis 05/11/2023   Spinal stenosis in cervical region 03/13/2023   Osteoarthritis of spine with  radiculopathy, cervical region 07/14/2022   Cervicogenic headache 11/30/2021   Chronic neck pain 11/30/2021   Weight loss, non-intentional    Dysphagia    Polyp of colon    Prostatic hyperplasia 08/25/2021   Chronic insomnia 03/19/2020   Peripheral neuropathy 03/19/2020   Iron deficiency anemia 01/06/2020   Allergic dermatitis 07/08/2019   Osteoarthritis of left knee 02/26/2019   History of colonic polyps    History of duodenal ulcer    CKD (chronic kidney disease), stage IIIa    Bilateral chronic knee pain 11/09/2018   Epistaxis 10/02/2018   Subjective tinnitus, bilateral 10/02/2018   Chronic pain of right thumb 02/22/2018   Acute bilateral thoracic back pain 01/23/2018   Osteoporosis without current pathological fracture 02/10/2017   Hypothyroidism 11/02/2016   Thoracic compression fracture (HCC) 09/06/2016   PVC's (premature ventricular contractions) 07/07/2016   Hypoalbuminemia 06/28/2016   IgA nephropathy 06/07/2016   Acute buttock pain 05/23/2016   MDD (major depressive disorder), recurrent episode, moderate (HCC) 11/20/2015   OSA (obstructive sleep apnea) 02/05/2014   Orthostatic hypotension 02/07/2013   Scotoma involving central area 01/29/2013   Optic  neuropathy 01/28/2013   HEMATURIA UNSPECIFIED 01/21/2010   HLD (hyperlipidemia) 07/05/2006   Generalized anxiety disorder 07/05/2006   CAD (coronary artery disease) 07/05/2006   GERD 07/05/2006    PCP: Dr. Greig Ring    REFERRING PROVIDER: Dr. Arley Helling   REFERRING DIAG: M54.6 (ICD-10-CM) - Acute bilateral thoracic back pain  THERAPY DIAG:  Abnormality of gait  Muscle weakness (generalized)  Balance disorder  Rationale for Evaluation and Treatment: Rehabilitation  ONSET DATE: May 2025    SUBJECTIVE:                                                                                                                                                                                                          SUBJECTIVE STATEMENT  Pt reports he is doing well, no updates and had a good weekend. No dizziness noted this date.   PERTINENT HISTORY:   73yoM referred to physical therapy for ongoing neck pain following cervical fusion in May 2020 and newly developed thoracic pain from T12 compression fracture that occurred sometime in Spring of 2025 (associated radicular pain pattern). Pt has pain localized to upper traps and suboccipitals. Pt attempted DC of prednisone  and gabapentin  two weeks prior to evaluation here, severe decline in function due to pain, barely able to walk for two weeks. He describes feeling increased fatigue and weakness to the point where he struggled to walk. Pt also reported repeated falls in the setting of orthostatic hypotension with him experiencing syncope when he stands up from a seated position. He describes feeling a burning sensation that is also localized to his scapulae and that it occurs when he is doing UE activities like washing his car that is likely the radicular pain from thoracic vertebrae compression fracture.  PAIN: Are you having pain? 03/05/2024: 6/10 today in the neck  PRECAUTIONS: None Pt has thoracic compression fractures and is s/p cervical spine surgery   WEIGHT BEARING RESTRICTIONS: No  FALLS:  Has patient fallen in last 6 months? Yes. Number of falls 6, pt reports that falls are happening in the context of syncope when standing up from a seated position (Orthostatic Hypotension) , where he blacks out and falls. He is following up with his PCP, Dr. Karlene who is working with him on stabilizing his blood pressure and she has recently taken him off Gabapentin  to see if this helps. Pt reports that he does note an improvement in his symptoms with less light headedness since going off medication.   LIVING ENVIRONMENT: Lives with: lives with their spouse Lives in: House/apartment Stairs:  No Has following equipment at home: Single point cane and Walker - 4  wheeled  OCCUPATION: Retired     PLOF: Independent  PATIENT GOALS: Improve balance, decrease falls.       OBJECTIVE:   Note: Objective measures were completed at evaluation unless otherwise noted. Reassessment Measures 02/09/24: -eyes closed balance, normal stance: <5sec with retropulsion each time, ModA falls recovery  -narrow stance eyes open balance: <10sec with LOB, ModA falls recovery  -Long distance AMB assessment c SPC: 178ft with 2 DOE standing breaks ~15 seconds each, 15sec; crossover gait intermittently without LOB; -standing vitals taken at end of walk: stable and comparable to prior seated vitals.  -cervical rotation ROM: 50deg Rt, 53 degree left  -shoulder flexion MMT: 5/5 Rt, 4+/5 left; shoulder ABDCT MMT: 5/5 bilat  -ankle DF assessment: can perform >10x full range while leaning against wall (adequate for gait needs)  -sensory testing about ankles, lower legs: (feels intact, but pt reports constance N/T below ankle to his toes)  Standing BP assessment: 02/28/24 -124/53mmHg 95bpm standing @ 0 -122/9mmHg 90bpm standing @ 1 110/15mmHg 92bpm standing @ 2  TREATMENT DATE 03/21/2024:   TA- To improve functional movements patterns for everyday tasks   Nustep level 2-5 progressive intensity increase  setting for B UE and LE reciprocal movement training and for LE strength and endurance. X 8 min. Check ins throughout to ensure appropriate response.   NMR: To facilitate reeducation of movement, balance, posture, coordination, and/or proprioception/kinesthetic sense.  On wobble board a/p orientation 3 rounds of 20 x ball shot and mini squat to pick up ball from basket elevated about 20 in off ground   Feet on airex STS with 2KG ball toss anterior 2 x 10  1 LE on airex other on step 3 x 30 sec ea LE   Incline board heel raises with focus on maintaining balance at end of eccentric portion  x 12 reps, 2 x 15 reps UE support allowed   Balance obstacle course x 3 laps  airex, step overs of hurdle stepping to airex, hedgehog slides, hurdle step overs, and cone weaving    Unless otherwise stated, CGA was provided and gait belt donned in order to ensure pt safety   PATIENT EDUCATION:  Education details: Pt progressing so far with cardio and balance activity.     Person educated: Patient Education method: Medical Illustrator Education comprehension: verbalized understanding, returned demonstration, and verbal cues required  HOME EXERCISE PROGRAM: Access Code: [email or web address] URL: [email or web address] Date: 03/12/2024 Prepared by: Lonni Gainer  Exercises - Marching Near Counter  - 1 x daily - 7 x weekly - 2 sets - 10 reps - 3 sec hold - Single leg balance near counter   - 1 x daily - 7 x weekly - 3 sets - 30 second hold - Heel Toe Raises with Counter Support  - 1 x daily - 7 x weekly - 3 sets - 10 reps  ASSESSMENT:  CLINICAL IMPRESSION:   Patient arrived with good motivation for completion of pt activities. Pt challenged with endurance and balance based activities to improve balance responses and reactions. Pt challenged with more dynamic balance activities with good ability to progress at this time. Pt highly challenged with ant/post balance perturbations on wobble board and also noted a lot of his LOB at home are falling backwards.  Pt will continue to benefit from skilled physical therapy intervention to address impairments, improve QOL, and attain therapy goals.    OBJECTIVE IMPAIRMENTS:  decreased balance, decreased ROM, decreased strength, hypomobility, impaired flexibility, impaired UE functional use, and pain.   ACTIVITY LIMITATIONS: carrying, lifting, standing, bathing, dressing, reach over head, and hygiene/grooming PARTICIPATION LIMITATIONS: cleaning, laundry, shopping, community activity, and yard work PERSONAL FACTORS: Age and 3+ comorbidities: CAD, Depression, are also affecting patient's functional outcome.  REHAB  POTENTIAL: Good CLINICAL DECISION MAKING: Stable/uncomplicated EVALUATION COMPLEXITY: Low   GOALS: Goals reviewed with patient? No  SHORT TERM GOALS: Target date: 01/03/2024  Patient will demonstrate undestanding of home exercise plan by performing exercises correctly with evidence of good carry over with min to no verbal or tactile cues .   Baseline: NT 12/25/23: Performing exercises independently  Goal status: ACHIEVED   2.  Patient will demonstrate understanding of how to modify exercises to decrease thoracic and cervical pain exacerbation for improved function and to decrease incidence of pain flare ups.  Baseline: Does not currently have strategy to manage this  Goal status: NOT MET      LONG TERM GOALS: Target date: 03/13/2024  Patient will show a statistically significant improvement in his neck function as evidence by >=7.5 pt decrease in her neck disability index score to better be able to move neck to improve visual field while driving to avoid accidents. Vernel et al, 2009) Baseline: 33/50 (66%); 02/09/24: 66% 03/01/2024: 50% Goal status: NOT MET     2.  Patient will increase cervical mobility by  >=5 degrees AROM for all planes of motion (flexion, extension, side bending, and rotation) for improved cervical function and evidence of pain reduction. Baseline: Cervical AROM Flex 30, Ext 30, Rot R/L 40/40, SB R/L 35/35; 02/09/24: Rt rotation 50 degrees, Left rotation: 53 degress  03/01/2024: Flex:  30*   Ext: 20*      Rot L: 32*     Rot R: 30*      SB L: 20*       SB R: 15* Goal status: MET    3.  Patient will improve shoulder and periscapular strength by 1/3 grade MMT (4- to 4) for improved cervical stability and UE function to carry out upper extremity activities like washing his car and gardening without being limited by pain and discomfort.  Baseline: Shoulder Flex R/L 4/4, Shoulder Abd R/L 4/4; 10/31: shoulder flexion Rt: 5/5, left 4+/5; shoulder ABDCT 5/5 bilat  03/01/2024:  Shld Flex R: 5 Shld Flex L: 4+ , Shld abd R: 4 +  Shld Abd L: 4 Goal status: MET    4.  Patient will reduce neck and thoracic pain to <=4/10 NRPS while performing UE activities like washing his car and doing yard work as evidence of improved cervical and thoracic function.  Baseline: 10/10 cervical paraspinal and rhomboid pain with UE activity; up to a 9/10 intermittently with activity   03/01/2024: 8/10 NPS UE activity Goal status: NOT MET    5. Patient (> 13 years old) will complete five times sit to stand test in < 15 seconds indicating an increased LE strength and improved balance. Baseline: 17.28s Goal status: INITIAL  6. Patient will increase Berg Balance score by > 6 points to demonstrate decreased fall risk during functional activities Baseline: 47/56 Goal status: INITIAL  7. Patient will increase 10 meter walk test to >1.72m/s as to improve gait speed for better community ambulation and to reduce fall risk. Baseline: normal pace: 0.14m/s; fast pace: 0.975s Goal status: INITIAL  8. Patient will reduce timed up and go to <10 seconds to reduce fall risk and  demonstrate improved transfer/gait ability. Baseline: 11.27s Goal status: INITIAL  9. Patient will decrease distance of by 173ft in order to increase cardiovascular endurance for improved community ambulation distance. Baseline: 948ft in 6 min w/ SPC Goal status: INITIAL  10. Pt will increase score on FGA by 4 points in order to increase functional ambulation and increase confidence in community environments. Baseline: 17/30 Goal status: INITIAL PLAN:  PT FREQUENCY: 1-2x/week PT DURATION: 8 weeks PLANNED INTERVENTIONS: [id number]- PT Re-evaluation, 97750- Physical Performance Testing, 97110-Therapeutic exercises, 97530- Therapeutic activity, [id number]- Neuromuscular re-education, 97535- Self Care, [id number]- Manual therapy, [phone]- Gait training, [phone]- Canalith repositioning, [id number]- Aquatic Therapy, [phone]- Electrical stimulation  (unattended), [phone]- Electrical stimulation (manual), [id number]- Traction (mechanical), 20560 (1-2 muscles), 20561 (3+ muscles)- Dry Needling, Patient/Family education, Balance training, Joint mobilization, Joint manipulation, Spinal manipulation, Spinal mobilization, Vestibular training, DME instructions, Cryotherapy, and Moist heat  PLAN FOR NEXT SESSION:   -continue to address cervical ROM/pain -address global weakness, deconditioning, and imbalance.   Note: Portions of this document were prepared using Dragon voice recognition software and although reviewed may contain unintentional dictation errors in syntax, grammar, or spelling.  Lonni KATHEE Gainer PT ,DPT Physical Therapist- [facility]   8:52 AM, 03/21/2024

## 2024-03-22 ENCOUNTER — Other Ambulatory Visit
Admission: RE | Admit: 2024-03-22 | Discharge: 2024-03-22 | Disposition: A | Attending: Pulmonary Disease | Admitting: Pulmonary Disease

## 2024-03-22 ENCOUNTER — Ambulatory Visit: Admitting: Pulmonary Disease

## 2024-03-22 DIAGNOSIS — D721 Eosinophilia, unspecified: Secondary | ICD-10-CM | POA: Diagnosis not present

## 2024-03-22 DIAGNOSIS — R42 Dizziness and giddiness: Secondary | ICD-10-CM | POA: Diagnosis not present

## 2024-03-22 DIAGNOSIS — R0602 Shortness of breath: Secondary | ICD-10-CM

## 2024-03-22 LAB — CBC WITH DIFFERENTIAL/PLATELET
Abs Immature Granulocytes: 0.01 K/uL (ref 0.00–0.07)
Basophils Absolute: 0.1 K/uL (ref 0.0–0.1)
Basophils Relative: 1 %
Eosinophils Absolute: 0.6 K/uL — ABNORMAL HIGH (ref 0.0–0.5)
Eosinophils Relative: 9 %
HCT: 36.4 % — ABNORMAL LOW (ref 39.0–52.0)
Hemoglobin: 11.6 g/dL — ABNORMAL LOW (ref 13.0–17.0)
Immature Granulocytes: 0 %
Lymphocytes Relative: 19 %
Lymphs Abs: 1.3 K/uL (ref 0.7–4.0)
MCH: 33.3 pg (ref 26.0–34.0)
MCHC: 31.9 g/dL (ref 30.0–36.0)
MCV: 104.6 fL — ABNORMAL HIGH (ref 80.0–100.0)
Monocytes Absolute: 0.4 K/uL (ref 0.1–1.0)
Monocytes Relative: 7 %
Neutro Abs: 4.2 K/uL (ref 1.7–7.7)
Neutrophils Relative %: 64 %
Platelets: 323 K/uL (ref 150–400)
RBC: 3.48 MIL/uL — ABNORMAL LOW (ref 4.22–5.81)
RDW: 13.1 % (ref 11.5–15.5)
WBC: 6.5 K/uL (ref 4.0–10.5)
nRBC: 0 % (ref 0.0–0.2)

## 2024-03-22 LAB — CORTISOL: Cortisol, Plasma: 10.4 ug/dL

## 2024-03-23 NOTE — Telephone Encounter (Signed)
 error

## 2024-03-25 ENCOUNTER — Encounter: Payer: Self-pay | Admitting: Family Medicine

## 2024-03-25 ENCOUNTER — Other Ambulatory Visit: Payer: Self-pay | Admitting: Neurosurgery

## 2024-03-25 ENCOUNTER — Ambulatory Visit: Payer: Self-pay | Admitting: Pulmonary Disease

## 2024-03-25 DIAGNOSIS — M542 Cervicalgia: Secondary | ICD-10-CM | POA: Diagnosis not present

## 2024-03-25 DIAGNOSIS — Z681 Body mass index (BMI) 19 or less, adult: Secondary | ICD-10-CM | POA: Diagnosis not present

## 2024-03-25 DIAGNOSIS — G629 Polyneuropathy, unspecified: Secondary | ICD-10-CM | POA: Diagnosis not present

## 2024-03-25 LAB — ALLERGEN PANEL (27) + IGE
Alternaria Alternata IgE: <0.1 kU/L
Aspergillus Fumigatus IgE: <0.1 kU/L
Bahia Grass IgE: <0.1 kU/L
Bermuda Grass IgE: <0.1 kU/L
Cat Dander IgE: <0.1 kU/L
Cedar, Mountain IgE: <0.1 kU/L
Cladosporium Herbarum IgE: <0.1 kU/L
Cocklebur IgE: 0.12 kU/L — AB
Cockroach, American IgE: <0.1 kU/L
Common Silver Birch IgE: 0.1 kU/L — AB
D Farinae IgE: <0.1 kU/L
D Pteronyssinus IgE: <0.1 kU/L
Dog Dander IgE: <0.1 kU/L
Elm, American IgE: 0.19 kU/L — AB
Hickory, White IgE: 0.15 kU/L — AB
IgE (Immunoglobulin E), Serum: 21 [IU]/mL (ref 6–495)
Johnson Grass IgE: <0.1 kU/L
Kentucky Bluegrass IgE: <0.1 kU/L
Maple/Box Elder IgE: 0.15 kU/L — AB
Mucor Racemosus IgE: <0.1 kU/L
Oak, White IgE: <0.1 kU/L
Penicillium Chrysogen IgE: <0.1 kU/L
Pigweed, Rough IgE: <0.1 kU/L
Plantain, English IgE: <0.1 kU/L
Ragweed, Short IgE: <0.1 kU/L
Setomelanomma Rostrat: <0.1 kU/L
Timothy Grass IgE: <0.1 kU/L
White Mulberry IgE: <0.1 kU/L

## 2024-03-26 ENCOUNTER — Ambulatory Visit

## 2024-03-26 ENCOUNTER — Encounter: Admitting: Speech Pathology

## 2024-03-26 ENCOUNTER — Ambulatory Visit: Admitting: Physical Therapy

## 2024-03-26 DIAGNOSIS — M6281 Muscle weakness (generalized): Secondary | ICD-10-CM

## 2024-03-26 DIAGNOSIS — R2689 Other abnormalities of gait and mobility: Secondary | ICD-10-CM

## 2024-03-26 DIAGNOSIS — R1312 Dysphagia, oropharyngeal phase: Secondary | ICD-10-CM | POA: Diagnosis not present

## 2024-03-26 DIAGNOSIS — R269 Unspecified abnormalities of gait and mobility: Secondary | ICD-10-CM

## 2024-03-26 NOTE — Therapy (Signed)
 OUTPATIENT PHYSICAL THERAPY TREATMENT   Patient Name: Shawn Lam MRN: [id number] DOB:1950/07/02, 73 y.o., male Today's Date: 03/26/2024  END OF SESSION:  PT End of Session - 03/26/24 0901     Visit Number 16    Number of Visits 27    Date for Recertification  04/30/24    Progress Note Due on Visit 20    PT Start Time 0931    PT Stop Time 1013    PT Time Calculation (min) 42 min    Equipment Utilized During Treatment Gait belt    Activity Tolerance Patient tolerated treatment well    Behavior During Therapy [facility] for tasks assessed/performed            Past Medical History:  Diagnosis Date   Actinic keratosis 02/04/2021   right lateral neck   Anemia    Anxiety    Asymptomatic LV dysfunction    50-55% by echo 06/2016   BCC (basal cell carcinoma of skin) 08/05/2020   left neck infra auricular - nodular pattern - treated with [facility] 09/17/2020   Berger's disease    CAD (coronary artery disease) 06/2005   PCI of LAD and RCA   Depression    Dilated aortic root    4cm at sinus of Valsalva   Hematuria    Hyperlipidemia    Hypothyroidism    Insomnia    Left ureteral stone    Orthostatic hypotension    negative tilt table   OSA (obstructive sleep apnea)    moderate with AHI 17/hr, uses CPAP nightly(01/15/19 - has Inspire implant)   PVC's (premature ventricular contractions) 07/07/2016   Renal disorder    PT IS SEEING DR. BETSEY- NEPHROLOGIST   Rosacea    Urticaria    Past Surgical History:  Procedure Laterality Date   ANTERIOR CERVICAL DECOMP/DISCECTOMY FUSION N/A 03/13/2023   Procedure: CERVICAL FOUR-FIVE, CERVICAL FIVE-SIX ANTERIOR CERVICAL DISCECTOMY/DECOMPRESSION FUSION;  Surgeon: Onetha Kuba, MD;  Location:  [facility] OR;  Service: Neurosurgery;  Laterality: N/A;  3C   CARDIAC CATHETERIZATION  09/06/2013   [facility]   CARDIAC CATHETERIZATION     COLONOSCOPY WITH PROPOFOL  N/A 01/21/2019   Procedure: COLONOSCOPY WITH PROPOFOL ;  Surgeon: Jinny Carmine, MD;  Location: [facility]  SURGERY [facility];  Service: Endoscopy;  Laterality: N/A;  sleep apnea   COLONOSCOPY WITH PROPOFOL  N/A 10/19/2021   Procedure: COLONOSCOPY WITH PROPOFOL ;  Surgeon: Jinny Carmine, MD;  Location: [facility] ENDOSCOPY;  Service: Endoscopy;  Laterality: N/A;   CORONARY ANGIOPLASTY  2004   STENT PLACEMENT   CYSTOSCOPY WITH RETROGRADE PYELOGRAM, URETEROSCOPY AND STENT PLACEMENT Left 03/19/2014   Procedure: CYSTOSCOPY WITH RETROGRADE PYELOGRAM, URETEROSCOPY AND STENT PLACEMENT;  Surgeon: Mickey Ricardo KATHEE Alvaro, MD;  Location: [facility] ORS;  Service: Urology;  Laterality: Left;   CYSTOSCOPY WITH RETROGRADE PYELOGRAM, URETEROSCOPY AND STENT PLACEMENT Bilateral 05/20/2015   Procedure:  CYSTOSCOPY WITH BILATERAL RETROGRADE PYELOGRAM,RIGHT  DIAGNOSTIC URETEROSCOPY ,LEFT URETEROSCOPY WITH HOLMIUM LASER  AND BILATERAL  STENT PLACEMENT ;  Surgeon: Ricardo Alvaro, MD;  Location: [facility] ORS;  Service: Urology;  Laterality: Bilateral;   DRUG INDUCED ENDOSCOPY N/A 10/20/2017   Procedure: DRUG INDUCED SLEEP ENDOSCOPY;  Surgeon: Carlie Clark, MD;  Location: [facility];  Service: ENT;  Laterality: N/A;   EP IMPLANTABLE DEVICE N/A 05/13/2016   Procedure: Loop Recorder Insertion;  Surgeon: Elspeth JAYSON Sage, MD;  Location: [facility] INVASIVE CV LAB;  Service: Cardiovascular;  Laterality: N/A;   ESOPHAGOGASTRODUODENOSCOPY N/A 10/19/2021   Procedure: ESOPHAGOGASTRODUODENOSCOPY (EGD);  Surgeon: Jinny Carmine, MD;  Location: [facility] ENDOSCOPY;  Service: Endoscopy;  Laterality: N/A;   ESOPHAGOGASTRODUODENOSCOPY (EGD) WITH PROPOFOL  N/A 11/30/2018   Procedure: ESOPHAGOGASTRODUODENOSCOPY (EGD) WITH PROPOFOL ;  Surgeon: Therisa Bi, MD;  Location: [facility] ENDOSCOPY;  Service: Gastroenterology;  Laterality: N/A;   ESOPHAGOGASTRODUODENOSCOPY (EGD) WITH PROPOFOL  N/A 12/01/2018   Procedure: ESOPHAGOGASTRODUODENOSCOPY (EGD) WITH PROPOFOL ;  Surgeon: Jinny Carmine, MD;  Location: [facility] ENDOSCOPY;  Service: Endoscopy;  Laterality: N/A;   HOLMIUM LASER APPLICATION Bilateral  05/20/2015   Procedure: HOLMIUM LASER APPLICATION;  Surgeon: Ricardo Likens, MD;  Location: [facility] ORS;  Service: Urology;  Laterality: Bilateral;   IMPLIMENTATION OF A HYPOGLOSSAL NERVE STIMULATOR  12/08/2017   OSA    IR GASTROSTOMY TUBE MOD SED  01/18/2024   IR RADIOLOGIST EVAL & MGMT  02/13/2024   LEFT HEART CATHETERIZATION WITH CORONARY ANGIOGRAM N/A 09/06/2013   Procedure: LEFT HEART CATHETERIZATION WITH CORONARY ANGIOGRAM;  Surgeon: Victory LELON Claudene DOUGLAS, MD;  Location: [facility] CATH LAB;  Service: Cardiovascular;  Laterality: N/A;   LEFT KNEE CAP  SURGERY ABOUT 40 YRS AGO  ? 1970     STONE EXTRACTION WITH BASKET Left 03/19/2014   Procedure: STONE EXTRACTION WITH BASKET;  Surgeon: Mickey Ricardo KATHEE Likens, MD;  Location: [facility] ORS;  Service: Urology;  Laterality: Left;   TRANSURETHRAL RESECTION OF PROSTATE N/A 08/25/2021   Procedure: TRANSURETHRAL RESECTION OF THE PROSTATE (TURP);  Surgeon: Likens Ricardo, MD;  Location: [facility] OR[facility];  Service: Urology;  Laterality: N/A;  1 HR   Patient Active Problem List   Diagnosis Date Noted   S/P gastrostomy tube (G tube) placement, follow-up exam 02/18/2024   Skin infection at gastrostomy tube site (HCC) 02/06/2024   Protein-calorie malnutrition, severe 01/23/2024   Multifocal pneumonia 01/22/2024   Fall 01/03/2024   Hematoma of right hip 01/03/2024   Chronic pulmonary aspiration 12/29/2023   Silent aspiration 12/29/2023   Moderate protein-calorie malnutrition (weight for age 15-74% of standard) 12/29/2023   Bilateral leg weakness 12/29/2023   Chronic respiratory failure with hypoxia, on home oxygen  therapy (HCC) 12/29/2023   Chronic constipation 12/29/2023   Somnolence 12/29/2023   Frequent falls 12/29/2023   Feeding difficulties, unspecified 12/20/2023   Shortness of breath 12/20/2023   Abnormal CT of the chest 10/10/2023   Elevated blood sugar 06/30/2023   Carotid stenosis 05/11/2023   Spinal stenosis in cervical region 03/13/2023   Osteoarthritis of spine with  radiculopathy, cervical region 07/14/2022   Cervicogenic headache 11/30/2021   Chronic neck pain 11/30/2021   Weight loss, non-intentional    Dysphagia    Polyp of colon    Prostatic hyperplasia 08/25/2021   Chronic insomnia 03/19/2020   Peripheral neuropathy 03/19/2020   Iron deficiency anemia 01/06/2020   Allergic dermatitis 07/08/2019   Osteoarthritis of left knee 02/26/2019   History of colonic polyps    History of duodenal ulcer    CKD (chronic kidney disease), stage IIIa    Bilateral chronic knee pain 11/09/2018   Epistaxis 10/02/2018   Subjective tinnitus, bilateral 10/02/2018   Chronic pain of right thumb 02/22/2018   Acute bilateral thoracic back pain 01/23/2018   Osteoporosis without current pathological fracture 02/10/2017   Hypothyroidism 11/02/2016   Thoracic compression fracture (HCC) 09/06/2016   PVC's (premature ventricular contractions) 07/07/2016   Hypoalbuminemia 06/28/2016   IgA nephropathy 06/07/2016   Acute buttock pain 05/23/2016   MDD (major depressive disorder), recurrent episode, moderate (HCC) 11/20/2015   OSA (obstructive sleep apnea) 02/05/2014   Orthostatic hypotension 02/07/2013   Scotoma involving central area 01/29/2013   Optic  neuropathy 01/28/2013   HEMATURIA UNSPECIFIED 01/21/2010   HLD (hyperlipidemia) 07/05/2006   Generalized anxiety disorder 07/05/2006   CAD (coronary artery disease) 07/05/2006   GERD 07/05/2006    PCP: Dr. Greig Ring    REFERRING PROVIDER: Dr. Arley Helling   REFERRING DIAG: M54.6 (ICD-10-CM) - Acute bilateral thoracic back pain  THERAPY DIAG:  No diagnosis found.  Rationale for Evaluation and Treatment: Rehabilitation  ONSET DATE: May 2025    SUBJECTIVE:                                                                                                                                                                                                         SUBJECTIVE STATEMENT  Pt reports he is doing well, no  updates and had a good weekend. No dizziness noted this date. Had a good session with SLP.   PERTINENT HISTORY:   73yoM referred to physical therapy for ongoing neck pain following cervical fusion in May 2020 and newly developed thoracic pain from T12 compression fracture that occurred sometime in Spring of 2025 (associated radicular pain pattern). Pt has pain localized to upper traps and suboccipitals. Pt attempted DC of prednisone  and gabapentin  two weeks prior to evaluation here, severe decline in function due to pain, barely able to walk for two weeks. He describes feeling increased fatigue and weakness to the point where he struggled to walk. Pt also reported repeated falls in the setting of orthostatic hypotension with him experiencing syncope when he stands up from a seated position. He describes feeling a burning sensation that is also localized to his scapulae and that it occurs when he is doing UE activities like washing his car that is likely the radicular pain from thoracic vertebrae compression fracture.  PAIN: Are you having pain? 03/05/2024: 6/10 today in the neck  PRECAUTIONS: None Pt has thoracic compression fractures and is s/p cervical spine surgery   WEIGHT BEARING RESTRICTIONS: No  FALLS:   Has patient fallen in last 6 months? Yes. Number of falls 6, pt reports that falls are happening in the context of syncope when standing up from a seated position (Orthostatic Hypotension) , where he blacks out and falls. He is following up with his PCP, Dr. Karlene who is working with him on stabilizing his blood pressure and she has recently taken him off Gabapentin  to see if this helps. Pt reports that he does note an improvement in his symptoms with less light headedness since going off medication.   LIVING ENVIRONMENT: Lives with: lives with their spouse Lives in: House/apartment Stairs:  No Has following equipment at home: Single point cane and Walker - 4 wheeled  OCCUPATION:  Retired     PLOF: Independent  PATIENT GOALS: Improve balance, decrease falls.       OBJECTIVE:   Note: Objective measures were completed at evaluation unless otherwise noted. Reassessment Measures 02/09/24: -eyes closed balance, normal stance: <5sec with retropulsion each time, ModA falls recovery  -narrow stance eyes open balance: <10sec with LOB, ModA falls recovery  -Long distance AMB assessment c SPC: 170ft with 2 DOE standing breaks ~15 seconds each, 15sec; crossover gait intermittently without LOB; -standing vitals taken at end of walk: stable and comparable to prior seated vitals.  -cervical rotation ROM: 50deg Rt, 53 degree left  -shoulder flexion MMT: 5/5 Rt, 4+/5 left; shoulder ABDCT MMT: 5/5 bilat  -ankle DF assessment: can perform >10x full range while leaning against wall (adequate for gait needs)  -sensory testing about ankles, lower legs: (feels intact, but pt reports constance N/T below ankle to his toes)  Standing BP assessment: 02/28/24 -124/34mmHg 95bpm standing @ 0 -122/47mmHg 90bpm standing @ 1 110/7mmHg 92bpm standing @ 2  TREATMENT DATE 03/26/2024:   TA- To improve functional movements patterns for everyday tasks   Nustep level 4-7 rolling hills setting  for B UE and LE reciprocal movement training and for LE strength and endurance. X 9 min. Check ins throughout to ensure appropriate response.   NMR: To facilitate reeducation of movement, balance, posture, coordination, and/or proprioception/kinesthetic sense.  On wobble board a/p orientation 3 rounds of 20 x ball shot and mini squat to pick up ball from basket elevated about 16 in off ground   STS holding p ball 2 x 10 with p ball bounce and catch on floor each rep in standing position  Airex side step up and down x 10 ea side   Incline board heel raises with focus on maintaining balance at end of eccentric portion  3 x 15 reps UE support allowed   Airex to beam to airex turn and repeat x 10    Sidestepping with GTB around knees and 2# AW around ankles 2 x 10 ea direction   Unless otherwise stated, CGA was provided and gait belt donned in order to ensure pt safety  Pt required occasional rest breaks due fatigue, PT was attentive to when pt appeared to be tired or winded in order to prevent excessive fatigue, of note, pt less fatigued and required less rest compared to previous sessions.    PATIENT EDUCATION:  Education details: Pt progressing so far with cardio and balance activity.     Person educated: Patient Education method: Medical Illustrator Education comprehension: verbalized understanding, returned demonstration, and verbal cues required  HOME EXERCISE PROGRAM: Access Code: [email or web address] URL: [email or web address] Date: 03/12/2024 Prepared by: Lonni Gainer  Exercises - Marching Near Counter  - 1 x daily - 7 x weekly - 2 sets - 10 reps - 3 sec hold - Single leg balance near counter   - 1 x daily - 7 x weekly - 3 sets - 30 second hold - Heel Toe Raises with Counter Support  - 1 x daily - 7 x weekly - 3 sets - 10 reps  ASSESSMENT:  CLINICAL IMPRESSION:   Patient arrived with good motivation for completion of pt activities. Pt challenged with endurance and balance based activities to improve balance responses and reactions. Pt challenged with more dynamic balance activities with good ability to progress at this time. Pt also requiring  less frequent rest breaks showing improved tolerance for standing and strength activities. Pt will continue to benefit from skilled physical therapy intervention to address impairments, improve QOL, and attain therapy goals.   OBJECTIVE IMPAIRMENTS: decreased balance, decreased ROM, decreased strength, hypomobility, impaired flexibility, impaired UE functional use, and pain.   ACTIVITY LIMITATIONS: carrying, lifting, standing, bathing, dressing, reach over head, and hygiene/grooming PARTICIPATION LIMITATIONS:  cleaning, laundry, shopping, community activity, and yard work PERSONAL FACTORS: Age and 3+ comorbidities: CAD, Depression, are also affecting patient's functional outcome.  REHAB POTENTIAL: Good CLINICAL DECISION MAKING: Stable/uncomplicated EVALUATION COMPLEXITY: Low   GOALS: Goals reviewed with patient? No  SHORT TERM GOALS: Target date: 01/03/2024  Patient will demonstrate undestanding of home exercise plan by performing exercises correctly with evidence of good carry over with min to no verbal or tactile cues .   Baseline: NT 12/25/23: Performing exercises independently  Goal status: ACHIEVED   2.  Patient will demonstrate understanding of how to modify exercises to decrease thoracic and cervical pain exacerbation for improved function and to decrease incidence of pain flare ups.  Baseline: Does not currently have strategy to manage this  Goal status: NOT MET      LONG TERM GOALS: Target date: 03/13/2024  Patient will show a statistically significant improvement in his neck function as evidence by >=7.5 pt decrease in her neck disability index score to better be able to move neck to improve visual field while driving to avoid accidents. Vernel et al, 2009) Baseline: 33/50 (66%); 02/09/24: 66% 03/01/2024: 50% Goal status: NOT MET     2.  Patient will increase cervical mobility by  >=5 degrees AROM for all planes of motion (flexion, extension, side bending, and rotation) for improved cervical function and evidence of pain reduction. Baseline: Cervical AROM Flex 30, Ext 30, Rot R/L 40/40, SB R/L 35/35; 02/09/24: Rt rotation 50 degrees, Left rotation: 53 degress  03/01/2024: Flex:  30*   Ext: 20*      Rot L: 32*     Rot R: 30*      SB L: 20*       SB R: 15* Goal status: MET    3.  Patient will improve shoulder and periscapular strength by 1/3 grade MMT (4- to 4) for improved cervical stability and UE function to carry out upper extremity activities like washing his car and gardening  without being limited by pain and discomfort.  Baseline: Shoulder Flex R/L 4/4, Shoulder Abd R/L 4/4; 10/31: shoulder flexion Rt: 5/5, left 4+/5; shoulder ABDCT 5/5 bilat  03/01/2024: Shld Flex R: 5 Shld Flex L: 4+ , Shld abd R: 4 +  Shld Abd L: 4 Goal status: MET    4.  Patient will reduce neck and thoracic pain to <=4/10 NRPS while performing UE activities like washing his car and doing yard work as evidence of improved cervical and thoracic function.  Baseline: 10/10 cervical paraspinal and rhomboid pain with UE activity; up to a 9/10 intermittently with activity   03/01/2024: 8/10 NPS UE activity Goal status: NOT MET    5. Patient (> 25 years old) will complete five times sit to stand test in < 15 seconds indicating an increased LE strength and improved balance. Baseline: 17.28s Goal status: INITIAL  6. Patient will increase Berg Balance score by > 6 points to demonstrate decreased fall risk during functional activities Baseline: 47/56 Goal status: INITIAL  7. Patient will increase 10 meter walk test to >1.18m/s as to improve gait speed for  better community ambulation and to reduce fall risk. Baseline: normal pace: 0.70m/s; fast pace: 0.975s Goal status: INITIAL  8. Patient will reduce timed up and go to <10 seconds to reduce fall risk and demonstrate improved transfer/gait ability. Baseline: 11.27s Goal status: INITIAL  9. Patient will decrease distance of by 162ft in order to increase cardiovascular endurance for improved community ambulation distance. Baseline: 992ft in 6 min w/ SPC Goal status: INITIAL  10. Pt will increase score on FGA by 4 points in order to increase functional ambulation and increase confidence in community environments. Baseline: 17/30 Goal status: INITIAL PLAN:  PT FREQUENCY: 1-2x/week PT DURATION: 8 weeks PLANNED INTERVENTIONS: [id number]- PT Re-evaluation, 97750- Physical Performance Testing, 97110-Therapeutic exercises, 97530- Therapeutic activity,  [id number]- Neuromuscular re-education, 97535- Self Care, [id number]- Manual therapy, [phone]- Gait training, [phone]- Canalith repositioning, [id number]- Aquatic Therapy, [phone]- Electrical stimulation (unattended), [phone]- Electrical stimulation (manual), [id number]- Traction (mechanical), 20560 (1-2 muscles), 20561 (3+ muscles)- Dry Needling, Patient/Family education, Balance training, Joint mobilization, Joint manipulation, Spinal manipulation, Spinal mobilization, Vestibular training, DME instructions, Cryotherapy, and Moist heat  PLAN FOR NEXT SESSION:   -continue to address cervical ROM/pain -address global weakness, deconditioning, and imbalance.   Note: Portions of this document were prepared using Dragon voice recognition software and although reviewed may contain unintentional dictation errors in syntax, grammar, or spelling.  Lonni KATHEE Gainer PT ,DPT Physical Therapist- [place]  [facility]   9:02 AM, 03/26/2024

## 2024-03-26 NOTE — Therapy (Signed)
 OUTPATIENT SPEECH LANGUAGE PATHOLOGY  SWALLOW TREATMENT / RE-CERTIFICATION     Patient Name: Shawn Lam MRN: [id number] DOB:1951/03/12, 73 y.o., male Today's Date: 03/26/2024  PCP: Greig Ring, MD REFERRING PROVIDER: Greig Ring, MD    End of Session - 03/26/24 0924     Visit Number 17    Number of Visits 43    Date for Recertification  06/18/24    Authorization Type Medicare Part A    Progress Note Due on Visit 20    SLP Start Time 0845    SLP Stop Time  0915    SLP Time Calculation (min) 30 min    Activity Tolerance Patient tolerated treatment well          Past Medical History:  Diagnosis Date   Actinic keratosis 02/04/2021   right lateral neck   Anemia    Anxiety    Asymptomatic LV dysfunction    50-55% by echo 06/2016   BCC (basal cell carcinoma of skin) 08/05/2020   left neck infra auricular - nodular pattern - treated with [facility] 09/17/2020   Berger's disease    CAD (coronary artery disease) 06/2005   PCI of LAD and RCA   Depression    Dilated aortic root    4cm at sinus of Valsalva   Hematuria    Hyperlipidemia    Hypothyroidism    Insomnia    Left ureteral stone    Orthostatic hypotension    negative tilt table   OSA (obstructive sleep apnea)    moderate with AHI 17/hr, uses CPAP nightly(01/15/19 - has Inspire implant)   PVC's (premature ventricular contractions) 07/07/2016   Renal disorder    PT IS SEEING DR. BETSEY- NEPHROLOGIST   Rosacea    Urticaria    Past Surgical History:  Procedure Laterality Date   ANTERIOR CERVICAL DECOMP/DISCECTOMY FUSION N/A 03/13/2023   Procedure: CERVICAL FOUR-FIVE, CERVICAL FIVE-SIX ANTERIOR CERVICAL DISCECTOMY/DECOMPRESSION FUSION;  Surgeon: Onetha Kuba, MD;  Location: [facility] OR;  Service: Neurosurgery;  Laterality: N/A;  3C   CARDIAC CATHETERIZATION  09/06/2013   [facility]   CARDIAC CATHETERIZATION     COLONOSCOPY WITH PROPOFOL  N/A 01/21/2019   Procedure: COLONOSCOPY WITH PROPOFOL ;  Surgeon: Jinny Carmine, MD;   Location: [facility];  Service: Endoscopy;  Laterality: N/A;  sleep apnea   COLONOSCOPY WITH PROPOFOL  N/A 10/19/2021   Procedure: COLONOSCOPY WITH PROPOFOL ;  Surgeon: Jinny Carmine, MD;  Location: [facility] END[facility];  Service: Endoscopy;  Laterality: N/A;   CORONARY ANGIOPLASTY  2004   STENT PLACEMENT   CYSTOSCOPY WITH RETROGRADE PYELOGRAM, URETEROSCOPY AND STENT PLACEMENT Left 03/19/2014   Procedure: CYSTOSCOPY WITH RETROGRADE PYELOGRAM, URETEROSCOPY AND STENT PLACEMENT;  Surgeon: Mickey Ricardo KATHEE Alvaro, MD;  Location: [facility];  Service: Urology;  Laterality: Left;   CYSTOSCOPY WITH RETROGRADE PYELOGRAM, URETEROSCOPY AND STENT PLACEMENT Bilateral 05/20/2015   Procedure:  CYSTOSCOPY WITH BILATERAL RETROGRADE PYELOGRAM,RIGHT  DIAGNOSTIC URETEROSCOPY ,LEFT URETEROSCOPY WITH HOLMIUM LASER  AND BILATERAL  STENT PLACEMENT ;  Surgeon: Ricardo Alvaro, MD;  Location: [facility];  Service: Urology;  Laterality: Bilateral;   DRUG INDUCED ENDOSCOPY N/A 10/20/2017   Procedure: DRUG INDUCED SLEEP ENDOSCOPY;  Surgeon: Carlie Clark, MD;  Location: [facility];  Service: ENT;  Laterality: N/A;   EP IMPLANTABLE DEVICE N/A 05/13/2016   Procedure: Loop Recorder Insertion;  Surgeon: Elspeth JAYSON Sage, MD;  Location: [facility];  Service: Cardiovascular;  Laterality: N/A;   ESOPHAGOGASTRODUODENOSCOPY N/A 10/19/2021   Procedure: ESOPHAGOGASTRODUODENOSCOPY (EGD);  Surgeon: Jinny Carmine,  MD;  Location: [facility];  Service: Endoscopy;  Laterality: N/A;   ESOPHAGOGASTRODUODENOSCOPY (EGD) WITH PROPOFOL  N/A 11/30/2018   Procedure: ESOPHAGOGASTRODUODENOSCOPY (EGD) WITH PROPOFOL ;  Surgeon: Therisa Bi, MD;  Location: [facility]COPY;  Service: Gastroenterology;  Laterality: N/A;   ESOPHAGOGASTRODUODENOSCOPY (EGD) WITH PROPOFOL  N/A 12/01/2018   Procedure: ESOPHAGOGASTRODUODENOSCOPY (EGD) WITH PROPOFOL ;  Surgeon: Jinny Carmine, MD;  Location: [facility];  Service: Endoscopy;  Laterality: N/A;   HOLMIUM LASER  APPLICATION Bilateral 05/20/2015   Procedure: HOLMIUM LASER APPLICATION;  Surgeon: Ricardo Likens, MD;  Location: [facility];  Service: Urology;  Laterality: Bilateral;   IMPLIMENTATION OF A HYPOGLOSSAL NERVE STIMULATOR  12/08/2017   OSA    IR GASTROSTOMY TUBE MOD SED  01/18/2024   IR RADIOLOGIST EVAL & MGMT  02/13/2024   LEFT HEART CATHETERIZATION WITH CORONARY ANGIOGRAM N/A 09/06/2013   Procedure: LEFT HEART CATHETERIZATION WITH CORONARY ANGIOGRAM;  Surgeon: Victory LELON Claudene DOUGLAS, MD;  Location: [facility];  Service: Cardiovascular;  Laterality: N/A;   LEFT KNEE CAP  SURGERY ABOUT 40 YRS AGO  ? 1970     STONE EXTRACTION WITH BASKET Left 03/19/2014   Procedure: STONE EXTRACTION WITH BASKET;  Surgeon: Mickey Ricardo KATHEE Likens, MD;  Location: [facility];  Service: Urology;  Laterality: Left;   TRANSURETHRAL RESECTION OF PROSTATE N/A 08/25/2021   Procedure: TRANSURETHRAL RESECTION OF THE PROSTATE (TURP);  Surgeon: Likens Ricardo, MD;  Location: [facility];  Service: Urology;  Laterality: N/A;  1 HR   Patient Active Problem List   Diagnosis Date Noted   S/P gastrostomy tube (G tube) placement, follow-up exam 02/18/2024   Skin infection at gastrostomy tube site (HCC) 02/06/2024   Protein-calorie malnutrition, severe 01/23/2024   Multifocal pneumonia 01/22/2024   Fall 01/03/2024   Hematoma of right hip 01/03/2024   Chronic pulmonary aspiration 12/29/2023   Silent aspiration 12/29/2023   Moderate protein-calorie malnutrition (weight for age 40-74% of standard) 12/29/2023   Bilateral leg weakness 12/29/2023   Chronic respiratory failure with hypoxia, on home oxygen  therapy (HCC) 12/29/2023   Chronic constipation 12/29/2023   Somnolence 12/29/2023   Frequent falls 12/29/2023   Feeding difficulties, unspecified 12/20/2023   Shortness of breath 12/20/2023   Abnormal CT of the chest 10/10/2023   Elevated blood sugar 06/30/2023   Carotid stenosis 05/11/2023   Spinal stenosis in cervical region 03/13/2023    Osteoarthritis of spine with radiculopathy, cervical region 07/14/2022   Cervicogenic headache 11/30/2021   Chronic neck pain 11/30/2021   Weight loss, non-intentional    Dysphagia    Polyp of colon    Prostatic hyperplasia 08/25/2021   Chronic insomnia 03/19/2020   Peripheral neuropathy 03/19/2020   Iron deficiency anemia 01/06/2020   Allergic dermatitis 07/08/2019   Osteoarthritis of left knee 02/26/2019   History of colonic polyps    History of duodenal ulcer    CKD (chronic kidney disease), stage IIIa    Bilateral chronic knee pain 11/09/2018   Epistaxis 10/02/2018   Subjective tinnitus, bilateral 10/02/2018   Chronic pain of right thumb 02/22/2018   Acute bilateral thoracic back pain 01/23/2018   Osteoporosis without current pathological fracture 02/10/2017   Hypothyroidism 11/02/2016   Thoracic compression fracture (HCC) 09/06/2016   PVC's (premature ventricular contractions) 07/07/2016   Hypoalbuminemia 06/28/2016   IgA nephropathy 06/07/2016   Acute buttock pain 05/23/2016   MDD (major depressive disorder), recurrent episode, moderate (HCC) 11/20/2015   OSA (obstructive sleep apnea) 02/05/2014   Orthostatic hypotension 02/07/2013   Scotoma involving central area 01/29/2013  Optic neuropathy 01/28/2013   HEMATURIA UNSPECIFIED 01/21/2010   HLD (hyperlipidemia) 07/05/2006   Generalized anxiety disorder 07/05/2006   CAD (coronary artery disease) 07/05/2006   GERD 07/05/2006    ONSET DATE: 03/2023 following ACDF; Date of referral 12/28/2023: date of this referral 02/01/2024  REFERRING DIAG:  T17.908D (ICD-10-CM) - Chronic pulmonary aspiration, subsequent encounter  T17.900D (ICD-10-CM) - Silent aspiration, subsequent encounter  E44.0 (ICD-10-CM) - Moderate protein-calorie malnutrition  R13.10 (ICD-10-CM) - Dysphagia, unspecified type    THERAPY DIAG:  Dysphagia, oropharyngeal phase  Rationale for Evaluation and Treatment Rehabilitation  SUBJECTIVE:    PERTINENT HISTORY and DIAGNOSTIC FINDINGS:  Pt is a 73 yo male w/ ACDF surgery 03/2023.  PMH: GERD(Not on a PPI per chart), Actinic keratosis (02/04/2021), Anemia, Anxiety, Asymptomatic LV dysfunction, BCC (basal cell carcinoma of skin) (08/05/2020), Berger's disease, CAD (coronary artery disease) (06/2005), resting tremor, Depression, Dilated aortic root (HCC), Hematuria, Hyperlipidemia, Hypothyroidism, Insomnia, Left ureteral stone, Orthostatic hypotension, OSA (obstructive sleep apnea), PVC's (premature ventricular contractions) (07/07/2016), Renal disorder, Rosacea, and Urticaria.  Pt is followed by Cardiology for Coronary Artery Stenosis currently; also followed for Migraines.     EGD in 2023 revealed Gastritis.      CT of Chest 10/2023: 1. Interim development of patchy consolidation and ground-glass   disease most evident at the right middle lobe and right lower lobes   with additional areas of mild ground-glass disease and scattered   clustered nodularity, findings are suspicious for multifocal   infection/pneumonia. Imaging follow-up to resolution is recommended.   2. Emphysema.   3. Aortic atherosclerosis.            Dysphagia - reported by pt as Chronic, ever since neck surgery in December 2024. Pt was seen in the emergency room s/p Fall, notes detailing that he slipped on soap in the shower, fell backwards on right side hit head and neck against shower seat, possible brief loss of consciousness, per chart.  ACDF 03/2023 per chart.  Since the neck surgery in 03/2023, he has noted food, liquids and pill getting stuck on the right side of my throat more.  I used to cough a lot more but not as much now.  I cough up what gets stuck..  He has lost significant weight per chart notes due to decreased eating.  Pt endorsed using his home O2 when he feels SOB.   MBSS 12/26/2023 Patient appears to present w/ Chronic, moderate-severe Pharyngeal phase dysphagia per this assessment today. It appeared  there was some impact from changes in the Cervical Esophagus s/p ACDF surgery(03/2023 per chart notes) including structural/tissue changes in/aournd the CP segment and cervical esophagus. Pt with intermittent silent aspiration of thin liquids.   Recommend continue current diet of easy to eat foods at home; thin liquids via cup- at this time. Oral care Before any oral intake. Swallow Strategies Recommended: No straws, moistened foods, SMALL bites/sips, Head Turn RIGHT w/ slight chin tuck, multiple swallows to aid clearing, AND strong Cough/throat clear b/t bites/sips and at end of meal.  D/t the Chronicity of pt's Pharyngeal phase Dysphagia w/ weight loss and pulmonary impact per recent Chest CT, pt and Family may need to discuss alternative means of nutrition/hydration as support w/ his Medical Team/PCP, while seeking swallow therapy.  Received ENT report dated 01/12/2024 Laryngoscopy revealed  Moderate vocal fold bowing bilaterally and moderate glottic gamp with phonation Impression/plan: Aspiration on MBSS and significant weight loss since ACDF last year. TVF shows moderate sized glottic gap on laryngoscoy  but mobile vocal folds bilaterally. Pooling in pyriform on MBSS but no significant pooling of secretions on laryngoscopy. I feel that between speech therpay and possibly bilateral injection laryngoplasty, hopefully his swallowing will significantly improve. Will make referral to Dr Brien at [facility].   Pt and his wife report appt with Dr Brien in January 2026.  10/10/205 PEG placed  01/22/2024 thru 01/24/2024 Hospitalized d/t fall and confusion CT of the chest on arrival showed multifocal pneumonia and possible free air. When reviewed further with interventional radiology team it was determined this was an appropriate amount of free air secondary to recent PEG tube placement. Patient was initiated on antibiotic therapy for pneumonia and improved significantly.   02/07/2024 CT Chest 1.  Evolving multifocal inflammatory infiltrates, progressive within the right upper lobe and improved within the lung bases bilaterally. Recommend appropriate clinical management and imaging follow-up per clinical course. 2. Mild emphysema. Given emphysema as an independent lung cancer risk factor, consider evaluation for a low-dose CT lung cancer screening program if the patient is 73 years old and otherwise eligible.   PAIN:  Are you having pain? No  FALLS: Has patient fallen in last 6 months?  See PT evaluation for details  LIVING ENVIRONMENT: Lives with: lives with their spouse Lives in: House/apartment  PLOF:  Level of assistance: Independent with ADLs, Independent with IADLs Employment: Retired   PATIENT GOALS  to improve swallow function, nutrition and hydration, decrease aspiration risks  SUBJECTIVE STATEMENT: Pt pleasant, reports that he and his wife have called [facility] Pt accompanied by: self   OBJECTIVE:   TODAY'S TREATMENT:  Skilled treatment session focused on pt's dysphagia goals. SLP facilitated the session by providing the following interventions:     Respiratory Muscle Strengthening: Pt independently able to maneuver devices correctly. EMST 75 set to 55 cm H2O: Pt able to complete three sets of 6 reps with occasional cues for adequate lip seal and rest breaks IMST set to 55 cm H2O: Pt able to complete 3 sets of 4 reps with occasional cues for rest breaks  Pharyngeal exercises: Masako: Pt able to complete 3 sets of 8 reps independently.  Head Extension Swallowing Exercise - Pt able to complete 3 sets of 8 repetitions independently.  CTAR: Pt able to independently complete 3 sets of 8 reps.   PO consumption: Skilled observation was provided of pt consuming thin liquids via cup. Pt with no overt s/s of aspiration (confirmed silent aspiration on most recent MBSS). Pt does report increased coughing at home when consuming water .   Pt endorsed xerostomia.  Education completed re: oral care and consideration for use of mouth moisturizers (e.g. Biotene).     PATIENT EDUCATION: Education details: as above Person educated: Patient Education method: Chief Technology Officer Education comprehension: needs further education  HOME EXERCISE PROGRAM:     See above   GOALS: Goals reviewed with patient? Yes  SHORT TERM GOALS: Target date: 10 sessions  Updated 02/28/2024  Updated 02/09/2024  Update 03/26/2024 With Mod I, pt will perform compensatory swallow strategies (chin tuck, multiple swallows, head turn) to eliminate s/s of aspiration when consuming least restrictive diet. Baseline: Goal status: MET: no overt s/sx with water  consumption this date  2.  With Min A, pt will complete (supraglottic, swallow, Mednelson maneuver, effortful swallow) to improve oral motor weakness, tongue base retraction, hyolarnygeal excursion, airway protection and/or clearance of the bolus through the pharynx. Baseline:  Goal status: IN PROGRESS: progress made; moderate A: Progress made, fluctuating between supervision  and independent cues  3. Pt will demonstrate overt s/s of aspiration when consuming ice chips.   Baseline: Goal status: IN PROGRESSING: no overt s/s of aspiration observed during today's session: Intermittent overt s/s observed   LONG TERM GOALS: Target date: 06/18/2024  Updated 02/28/2024  Updated: 02/09/2024  Updated: 03/26/2024   With Mod I, pt will demonstrate the ability to adequately self-monitor swallowing skills and perform appropriate compensatory techniques to reduce s/s of aspiration and to safely consume least restrictive diet.     Baseline:  Goal status: IN PROGRESS: continues to be appropriate goal  2.  Pt will participate in repeat instrumental swallow evaluation to assess least restrictive diet.  Baseline:  Goal status: IN PROGRESS: continues to be appropriate goal: Modified Barium scheduled completed 11/21; would benefit  from repeat MBSS   ASSESSMENT:  CLINICAL IMPRESSION: Patient is a 73 y.o. male who was seen today for a clinical swallow re-evaluation d/t severe pharyngeal phase dysphagia following ACDF (03/2023).    See the above treatment note for details.   Noted during course of therapy, thus far, pt seen by ENT who noted aspiration on MBSS and significant weight loss since ACDF last year. TVF shows moderate sized glottic gap on laryngoscoy but mobile vocal folds bilaterally. Pooling in pyriform on MBSS but no significant pooling of secretions on laryngoscopy. I feel that between speech therapy and possibly bilateral injection laryngoplasty, hopefully his swallowing will significantly improve. Will make referral to [facility] at [facility]. Pt with pending referral in January 2026. Additionally, pt with PEG placed on 01/19/24.  Pt would benefit from continued ST targeting oropharyngeal strengthening, education, and repeat instrumental swallowing evaluation likely prior to and/or after pt's visit with Dr. Brien. Pt is making progress toward goals.  OBJECTIVE IMPAIRMENTS include dysphagia. These impairments are limiting patient from safety when swallowing. Factors affecting potential to achieve goals and functional outcome are severity of impairments and malnutrition. Patient will benefit from skilled SLP services to address above impairments and improve overall function.  REHAB POTENTIAL: Good  PLAN: SLP FREQUENCY: 1-2x/week  SLP DURATION: 12 weeks  PLANNED INTERVENTIONS: Aspiration precaution training, Pharyngeal strengthening exercises, Diet toleration management , Oral motor exercises, SLP instruction and feedback, Compensatory strategies, and Patient/family education   Delon Bangs, M.S., CCC-SLP Speech-Language Pathologist [facility] [phone] (ASCOM)

## 2024-03-27 ENCOUNTER — Ambulatory Visit: Payer: Medicare Other

## 2024-03-27 ENCOUNTER — Telehealth: Payer: Self-pay | Admitting: *Deleted

## 2024-03-27 ENCOUNTER — Encounter (HOSPITAL_COMMUNITY): Payer: Self-pay

## 2024-03-27 VITALS — BP 102/60 | Ht 72.0 in | Wt 119.8 lb

## 2024-03-27 DIAGNOSIS — Z0000 Encounter for general adult medical examination without abnormal findings: Secondary | ICD-10-CM

## 2024-03-27 DIAGNOSIS — Z931 Gastrostomy status: Secondary | ICD-10-CM

## 2024-03-27 NOTE — Telephone Encounter (Signed)
-----   Message from Veva JINNY Ferrari sent at 03/27/2024  3:22 PM EST ----- Regarding: Lab orders for Tue, 12.30.25 Patient is scheduled for CPX labs, please order future labs, Thanks , Veva

## 2024-03-27 NOTE — Telephone Encounter (Signed)
 See resent labs.  Does he need to repeat any for his CPE?

## 2024-03-27 NOTE — Patient Instructions (Signed)
 [name],  Thank you for taking the time for your Medicare Wellness Visit. I appreciate your continued commitment to your health goals. Please review the care plan we discussed, and feel free to reach out if I can assist you further.  Please note that Annual Wellness Visits do not include a physical exam. Some assessments may be limited, especially if the visit was conducted virtually. If needed, we may recommend an in-person follow-up with your provider.  Ongoing Care Seeing your primary care provider every 3 to 6 months helps us  monitor your health and provide consistent, personalized care.   Referrals If a referral was made during today's visit and you haven't received any updates within two weeks, please contact the referred provider directly to check on the status.  Recommended Screenings:  Health Maintenance  Topic Date Due   COVID-19 Vaccine (4 - 2025-26 season) 12/11/2023   Medicare Annual Wellness Visit  03/26/2024   Osteoporosis screening with Bone Density Scan  04/13/2024   Colon Cancer Screening  10/20/2026   DTaP/Tdap/Td vaccine (3 - Td or Tdap) 03/12/2029   Pneumococcal Vaccine for age over 57  Completed   Flu Shot  Completed   Hepatitis C Screening  Completed   Zoster (Shingles) Vaccine  Completed   Meningitis B Vaccine  Aged Out       03/25/2024    3:46 PM  Advanced Directives  Does Patient Have a Medical Advance Directive? No    Vision: Annual vision screenings are recommended for early detection of glaucoma, cataracts, and diabetic retinopathy. These exams can also reveal signs of chronic conditions such as diabetes and high blood pressure.  Dental: Annual dental screenings help detect early signs of oral cancer, gum disease, and other conditions linked to overall health, including heart disease and diabetes.

## 2024-03-27 NOTE — Progress Notes (Signed)
 Chief Complaint  Patient presents with   Medicare Wellness     Subjective:   Shawn Lam is a 73 y.o. male who presents for a Medicare Annual Wellness Visit.  Visit info / Clinical Intake: Medicare Wellness Visit Type:: Subsequent Annual Wellness Visit Persons participating in visit and providing information:: patient Medicare Wellness Visit Mode:: In-person (required for WTM) Interpreter Needed?: No Pre-visit prep was completed: yes AWV questionnaire completed by patient prior to visit?: yes Date:: 03/25/24 Living arrangements:: (Patient-Rptd) lives with spouse/significant other Patient's Overall Health Status Rating: (Patient-Rptd) very good Typical amount of pain: (Patient-Rptd) some Does pain affect daily life?: (Patient-Rptd) no Are you currently prescribed opioids?: no  Dietary Habits and Nutritional Risks How many meals a day?: (Patient-Rptd) other: Eats fruit and vegetables daily?: (!) (Patient-Rptd) no Most meals are obtained by: (Patient-Rptd) having others provide food In the last 2 weeks, have you had any of the following?: unintentional weight loss Diabetic:: no  Functional Status Activities of Daily Living (to include ambulation/medication): (Patient-Rptd) Independent Ambulation: (Patient-Rptd) Independent Medication Administration: (Patient-Rptd) Independent Home Management (perform basic housework or laundry): (Patient-Rptd) Independent Manage your own finances?: (Patient-Rptd) yes Primary transportation is: (Patient-Rptd) family / friends Concerns about vision?: no *vision screening is required for WTM* (pt not concerned but says he does not drive much and NOT at night;has eye visits every w/Dr Nice) Concerns about hearing?: no  Fall Screening Falls in the past year?: (Patient-Rptd) 1 Number of falls in past year: (Patient-Rptd) 1 Was there an injury with Fall?: (Patient-Rptd) 0 Fall Risk Category Calculator: (Patient-Rptd) 2 Patient Fall  Risk Level: (Patient-Rptd) Moderate Fall Risk  Fall Risk Patient at Risk for Falls Due to: Impaired mobility; Impaired balance/gait; Orthopedic patient Fall risk Follow up: Falls evaluation completed; Education provided; Falls prevention discussed  Home and Transportation Safety: All rugs have non-skid backing?: (Patient-Rptd) N/A, no rugs All stairs or steps have railings?: (Patient-Rptd) N/A, no stairs Grab bars in the bathtub or shower?: (Patient-Rptd) yes Have non-skid surface in bathtub or shower?: (Patient-Rptd) yes Good home lighting?: (Patient-Rptd) yes Regular seat belt use?: (Patient-Rptd) yes Hospital stays in the last year:: (!) (Patient-Rptd) yes How many hospital stays:: (Patient-Rptd) 2  Cognitive Assessment Difficulty concentrating, remembering, or making decisions? : (Patient-Rptd) no Will 6CIT or Mini Cog be Completed: yes What year is it?: 0 points What month is it?: 0 points Give patient an address phrase to remember (5 components): [address] [place]  About what time is it?: 0 points Count backwards from 20 to 1: 0 points Say the months of the year in reverse: 2 points Repeat the address phrase from earlier: 4 points 6 CIT Score: 6 points  Advance Directives (For Healthcare) Does Patient Have a Medical Advance Directive?: No Would patient like information on creating a medical advance directive?: No - Patient declined  Reviewed/Updated  Reviewed/Updated: Reviewed All (Medical, Surgical, Family, Medications, Allergies, Care Teams, Patient Goals)    Allergies (verified) Codeine and Tape   Current Medications (verified) Outpatient Encounter Medications as of 03/27/2024  Medication Sig   aspirin  81 MG chewable tablet Place 1 tablet (81 mg total) into feeding tube daily.   atorvastatin  (LIPITOR ) 80 MG tablet Place 1 tablet (80 mg total) into feeding tube daily.   buPROPion  (WELLBUTRIN  XL) 150 MG 24 hr tablet Take 2 tablets (300 mg total) by  mouth daily.   busPIRone  (BUSPAR ) 10 MG tablet Place 2 tablets (20 mg total) into feeding tube 3 (three) times daily.  Cholecalciferol  (VITAMIN D3) 50 MCG (2000 UT) TABS Place 1 tablet (2,000 Units total) into feeding tube daily.   clopidogrel  (PLAVIX ) 75 MG tablet Place 1 tablet (75 mg total) into feeding tube daily.   cyanocobalamin  1000 MCG tablet Place 1 tablet (1,000 mcg total) into feeding tube daily.   denosumab  (PROLIA ) 60 MG/ML SOSY injection Inject 60 mg into the skin every 6 (six) months. Do not send RX, order this from the office   DULoxetine  (CYMBALTA ) 30 MG capsule TAKE THREE CAPSULES BY MOUTH DAILY   Erenumab-aooe (AIMOVIG) 140 MG/ML SOAJ 140 mg every 28 (twenty-eight) days.   finasteride  (PROSCAR ) 5 MG tablet Take 5 mg by mouth daily.   levothyroxine  (SYNTHROID ) 88 MCG tablet Place 1 tablet (88 mcg total) into feeding tube daily before breakfast.   metoprolol  tartrate (LOPRESSOR ) 25 mg/10 mL SUSP Place 4 mLs (10 mg total) into feeding tube 2 (two) times daily.   midodrine  (PROAMATINE ) 5 MG tablet Take 1 tablet (5 mg total) by mouth 3 (three) times daily as needed. For Systolic Blood Pressure (Top number) of less than 115   nitroGLYCERIN  (NITROSTAT ) 0.4 MG SL tablet Place 1 tablet (0.4 mg total) under the tongue every 5 (five) minutes as needed for chest pain.   Nutritional Supplements (FEEDING SUPPLEMENT, OSMOLITE 1.5 CAL,) LIQD Place 120 mLs into feeding tube 6 (six) times daily.   triamcinolone  cream (KENALOG ) 0.1 % APPLY 2 TOPICALLY DAILY , DO NOT APPLY ON DACE , GROIN AND UNDERARMS.   Water  For Irrigation, Sterile (FREE WATER ) SOLN Place 100 mLs into feeding tube 6 (six) times daily.   zolpidem  (AMBIEN ) 10 MG tablet TAKE 1 TABLET BY MOUTH EVERY NIGHT AT BEDTIME AS NEEDED FOR SLEEP   zonisamide  (ZONEGRAN ) 25 MG capsule Take 1 capsule (25 mg total) by mouth daily.   baclofen  (LIORESAL ) 10 MG tablet Place 1 tablet (10 mg total) into feeding tube 2 (two) times daily. (Patient not  taking: Reported on 03/27/2024)   No facility-administered encounter medications on file as of 03/27/2024.    History: Past Medical History:  Diagnosis Date   Actinic keratosis 02/04/2021   right lateral neck   Anemia    Anxiety    Asymptomatic LV dysfunction    50-55% by echo 06/2016   BCC (basal cell carcinoma of skin) 08/05/2020   left neck infra auricular - nodular pattern - [facility] with [facility] 09/17/2020   Berger's disease    Blood transfusion without reported diagnosis 04/11/2018   CAD (coronary artery disease) 06/2005   PCI of LAD and RCA   Cancer (HCC)    Cataract 04/11/2016   04/11/2016   Depression    Dilated aortic root    4cm at sinus of Valsalva   Hematuria    Hyperlipidemia    Hypothyroidism    Insomnia    Left ureteral stone    Orthostatic hypotension    negative tilt table   OSA (obstructive sleep apnea)    moderate with AHI 17/hr, uses CPAP nightly(01/15/19 - has Inspire implant)   Osteoporosis 04/11/2016   PVC's (premature ventricular contractions) 07/07/2016   Renal disorder    PT IS SEEING DR. BETSEY- NEPHROLOGIST   Rosacea    Sleep apnea 04/11/2016   04/11/2016   Ulcer 04/11/2018   Urticaria    Past Surgical History:  Procedure Laterality Date   ANTERIOR CERVICAL DECOMP/DISCECTOMY FUSION N/A 03/13/2023   Procedure: CERVICAL FOUR-FIVE, CERVICAL FIVE-SIX ANTERIOR CERVICAL DISCECTOMY/DECOMPRESSION FUSION;  Surgeon: Onetha Kuba, MD;  Location: [facility]  OR;  Service: Neurosurgery;  Laterality: N/A;  3C   CARDIAC CATHETERIZATION  09/06/2013   [facility]   CARDIAC CATHETERIZATION     COLONOSCOPY WITH PROPOFOL  N/A 01/21/2019   Procedure: COLONOSCOPY WITH PROPOFOL ;  Surgeon: Jinny Carmine, MD;  Location: [facility];  Service: Endoscopy;  Laterality: N/A;  sleep apnea   COLONOSCOPY WITH PROPOFOL  N/A 10/19/2021   Procedure: COLONOSCOPY WITH PROPOFOL ;  Surgeon: Jinny Carmine, MD;  Location: [facility] ENDOSCOPY;  Service: Endoscopy;  Laterality: N/A;   CORONARY  ANGIOPLASTY  2004   STENT PLACEMENT   CYSTOSCOPY WITH RETROGRADE PYELOGRAM, URETEROSCOPY AND STENT PLACEMENT Left 03/19/2014   Procedure: CYSTOSCOPY WITH RETROGRADE PYELOGRAM, URETEROSCOPY AND STENT PLACEMENT;  Surgeon: Mickey Ricardo KATHEE Alvaro, MD;  Location: [facility] ORS;  Service: Urology;  Laterality: Left;   CYSTOSCOPY WITH RETROGRADE PYELOGRAM, URETEROSCOPY AND STENT PLACEMENT Bilateral 05/20/2015   Procedure:  CYSTOSCOPY WITH BILATERAL RETROGRADE PYELOGRAM,RIGHT  DIAGNOSTIC URETEROSCOPY ,LEFT URETEROSCOPY WITH HOLMIUM LASER  AND BILATERAL  STENT PLACEMENT ;  Surgeon: Ricardo Alvaro, MD;  Location: [facility];  Service: Urology;  Laterality: Bilateral;   DRUG INDUCED ENDOSCOPY N/A 10/20/2017   Procedure: DRUG INDUCED SLEEP ENDOSCOPY;  Surgeon: Carlie Clark, MD;  Location: [facility];  Service: ENT;  Laterality: N/A;   EP IMPLANTABLE DEVICE N/A 05/13/2016   Procedure: Loop Recorder Insertion;  Surgeon: Elspeth JAYSON Sage, MD;  Location: [facility] CV [facility];  Service: Cardiovascular;  Laterality: N/A;   ESOPHAGOGASTRODUODENOSCOPY N/A 10/19/2021   Procedure: ESOPHAGOGASTRODUODENOSCOPY (EGD);  Surgeon: Jinny Carmine, MD;  Location: [facility] ENDOSCOPY;  Service: Endoscopy;  Laterality: N/A;   ESOPHAGOGASTRODUODENOSCOPY (EGD) WITH PROPOFOL  N/A 11/30/2018   Procedure: ESOPHAGOGASTRODUODENOSCOPY (EGD) WITH PROPOFOL ;  Surgeon: Therisa Bi, MD;  Location: [facility] ENDOSCOPY;  Service: Gastroenterology;  Laterality: N/A;   ESOPHAGOGASTRODUODENOSCOPY (EGD) WITH PROPOFOL  N/A 12/01/2018   Procedure: ESOPHAGOGASTRODUODENOSCOPY (EGD) WITH PROPOFOL ;  Surgeon: Jinny Carmine, MD;  Location: [facility] ENDOSCOPY;  Service: Endoscopy;  Laterality: N/A;   HOLMIUM LASER APPLICATION Bilateral 05/20/2015   Procedure: HOLMIUM LASER APPLICATION;  Surgeon: Ricardo Alvaro, MD;  Location: [facility];  Service: Urology;  Laterality: Bilateral;   IMPLIMENTATION OF A HYPOGLOSSAL NERVE STIMULATOR  12/08/2017   OSA    IR GASTROSTOMY TUBE MOD SED   01/18/2024   IR RADIOLOGIST EVAL & MGMT  02/13/2024   LEFT HEART CATHETERIZATION WITH CORONARY ANGIOGRAM N/A 09/06/2013   Procedure: LEFT HEART CATHETERIZATION WITH CORONARY ANGIOGRAM;  Surgeon: Victory LELON Claudene DOUGLAS, MD;  Location: [facility] CATH LAB;  Service: Cardiovascular;  Laterality: N/A;   LEFT KNEE CAP  SURGERY ABOUT 40 YRS AGO  ? 1970     SPINE SURGERY  Desk in neck   STONE EXTRACTION WITH BASKET Left 03/19/2014   Procedure: STONE EXTRACTION WITH BASKET;  Surgeon: Mickey Ricardo KATHEE Alvaro, MD;  Location: [facility];  Service: Urology;  Laterality: Left;   TRANSURETHRAL RESECTION OF PROSTATE N/A 08/25/2021   Procedure: TRANSURETHRAL RESECTION OF THE PROSTATE (TURP);  Surgeon: Alvaro Ricardo, MD;  Location: [facility];  Service: Urology;  Laterality: N/A;  1 HR   Family History  Problem Relation Age of Onset   Coronary artery disease Mother    Heart attack Mother    Heart disease Mother    Hypertension Mother    Arthritis Mother    Cancer Father        lung and colon   Lung cancer Father        smoker   Diabetes Brother    Social History   Occupational  History   Occupation: retired health and safety inspector  Tobacco Use   Smoking status: Never   Smokeless tobacco: Never  Vaping Use   Vaping status: Never Used  Substance and Sexual Activity   Alcohol use: Not Currently    Alcohol/week: 1.0 standard drink of alcohol    Types: 1 Cans of beer per week   Drug use: No   Sexual activity: Yes    Partners: Female    Birth control/protection: None   Tobacco Counseling Counseling given: Not Answered  SDOH Screenings   Food Insecurity: No Food Insecurity (03/27/2024)  Housing: Low Risk (03/27/2024)  Transportation Needs: No Transportation Needs (03/27/2024)  Utilities: Not At Risk (03/27/2024)  Alcohol Screen: Low Risk (02/09/2024)  Depression (PHQ2-9): Low Risk (03/27/2024)  Recent Concern: Depression (PHQ2-9) - High Risk (12/29/2023)  Financial Resource Strain: Low Risk (02/09/2024)  Physical  Activity: Sufficiently Active (03/27/2024)  Social Connections: Moderately Integrated (03/27/2024)  Recent Concern: Social Connections - Moderately Isolated (02/09/2024)  Stress: Stress Concern Present (03/27/2024)  Tobacco Use: Low Risk (03/27/2024)  Health Literacy: Adequate Health Literacy (03/27/2024)   See flowsheets for full screening details  Depression Screen Depression Screening Exception Documentation Depression Screening Exception:: Other- indicate reason in comment box Depression Screening Exception Comment:: unable to assess due to assessment being completed with spouse  PHQ 2 & 9 Depression Scale- Over the past 2 weeks, how often have you been bothered by any of the following problems? Little interest or pleasure in doing things: 0 Feeling down, depressed, or hopeless (PHQ Adolescent also includes...irritable): 0 PHQ-2 Total Score: 0 Trouble falling or staying asleep, or sleeping too much: 1 Feeling tired or having little energy: 3 Poor appetite or overeating (PHQ Adolescent also includes...weight loss): 3 Feeling bad about yourself - or that you are a failure or have let yourself or your family down: 2 Trouble concentrating on things, such as reading the newspaper or watching television (PHQ Adolescent also includes...like school work): 1 Moving or speaking so slowly that other people could have noticed. Or the opposite - being so fidgety or restless that you have been moving around a lot more than usual: 2 Thoughts that you would be better off dead, or of hurting yourself in some way: 0 PHQ-9 Total Score: 16 If you checked off any problems, how difficult have these problems made it for you to do your work, take care of things at home, or get along with other people?: Somewhat difficult  Depression Treatment Depression Interventions/Treatment : Counseling     Goals Addressed             This Visit's Progress    Patient Stated   On track    Would like to continue  to walk. 03/27/23 pt would like to continue this             Objective:    Today's Vitals   03/27/24 0807  BP: 102/60  Weight: 119 lb 12.8 oz (54.3 kg)  Height: 6' (1.829 m)   Body mass index is 16.25 kg/m.  Hearing/Vision screen Vision Screening - Comments:: UTD w/Dr Laurice Immunizations and Health Maintenance Health Maintenance  Topic Date Due   COVID-19 Vaccine (4 - 2025-26 season) 12/11/2023   Bone Density Scan  04/13/2024   Medicare Annual Wellness (AWV)  03/27/2025   Colonoscopy  10/20/2026   DTaP/Tdap/Td (3 - Td or Tdap) 03/12/2029   Pneumococcal Vaccine: 50+ Years  Completed   Influenza Vaccine  Completed   Hepatitis C Screening  Completed  Zoster Vaccines- Shingrix  Completed   Meningococcal B Vaccine  Aged Out        Assessment/Plan:  This is a routine wellness examination for Elsie.  Patient Care Team: Avelina Greig BRAVO, MD as PCP - General Perla Evalene PARAS, MD as PCP - Cardiology (Cardiology) Fate Morna SAILOR, [facility] (Inactive) as Pharmacist (Pharmacist) Laurice Francis NOVAK, [place] (Optometry) Onetha Kuba, MD as Consulting Physician (Neurosurgery)  I have personally reviewed and noted the following in the patients chart:   Medical and social history Use of alcohol, tobacco or illicit drugs  Current medications and supplements including opioid prescriptions. Functional ability and status Nutritional status Physical activity Advanced directives List of other physicians Hospitalizations, surgeries, and ER visits in previous 12 months Vitals Screenings to include cognitive, depression, and falls Referrals and appointments  No orders of the defined types were placed in this encounter.  In addition, I have reviewed and discussed with patient certain preventive protocols, quality metrics, and best practice recommendations. A written personalized care plan for preventive services as well as general preventive health recommendations were provided to  patient.   Erminio LITTIE Saris, LPN   [id number]   Return in 1 year (on 03/27/2025) for annual wellness.  After Visit Summary: (In Person-Declined) Patient declined AVS at this time.Pt says he will view in MyChart  Nurse Notes: No voiced or noted concerns at this time Patient advised to keep follow-up appointment with PCP (Jan 2026) Appointment(s) made: (AWV/CPE Jan 2027) HM Addressed: Vaccines Due: Covid (pt does ot desire at this eime. Pt made aware he can receive at his pharmacy at any time he decides to receive.

## 2024-03-28 ENCOUNTER — Ambulatory Visit: Admitting: Speech Pathology

## 2024-03-28 ENCOUNTER — Ambulatory Visit

## 2024-03-28 ENCOUNTER — Telehealth: Payer: Self-pay | Admitting: Cardiovascular Disease

## 2024-03-28 ENCOUNTER — Ambulatory Visit
Admission: RE | Admit: 2024-03-28 | Discharge: 2024-03-28 | Disposition: A | Discharge status: Home / Self Care | Source: Ambulatory Visit | Attending: Neurosurgery | Admitting: Neurosurgery

## 2024-03-28 ENCOUNTER — Ambulatory Visit: Admitting: Physical Therapy

## 2024-03-28 ENCOUNTER — Ambulatory Visit (HOSPITAL_BASED_OUTPATIENT_CLINIC_OR_DEPARTMENT_OTHER)
Admission: RE | Admit: 2024-03-28 | Discharge: 2024-03-28 | Disposition: A | Discharge status: Home / Self Care | Source: Ambulatory Visit | Attending: Student | Admitting: Student

## 2024-03-28 ENCOUNTER — Encounter: Admitting: Speech Pathology

## 2024-03-28 DIAGNOSIS — M546 Pain in thoracic spine: Secondary | ICD-10-CM

## 2024-03-28 DIAGNOSIS — R1312 Dysphagia, oropharyngeal phase: Secondary | ICD-10-CM | POA: Diagnosis not present

## 2024-03-28 DIAGNOSIS — R079 Chest pain, unspecified: Secondary | ICD-10-CM | POA: Insufficient documentation

## 2024-03-28 DIAGNOSIS — M542 Cervicalgia: Secondary | ICD-10-CM

## 2024-03-28 DIAGNOSIS — M6281 Muscle weakness (generalized): Secondary | ICD-10-CM

## 2024-03-28 DIAGNOSIS — I2089 Other forms of angina pectoris: Secondary | ICD-10-CM | POA: Diagnosis not present

## 2024-03-28 DIAGNOSIS — R2689 Other abnormalities of gait and mobility: Secondary | ICD-10-CM

## 2024-03-28 DIAGNOSIS — R269 Unspecified abnormalities of gait and mobility: Secondary | ICD-10-CM

## 2024-03-28 MED ORDER — RUBIDIUM RB82 GENERATOR (RUBYFILL)
25.0000 | PACK | Freq: Once | INTRAVENOUS | Status: AC
  Administered 2024-03-28: 14:00:00 14.29 via INTRAVENOUS

## 2024-03-28 MED ORDER — RUBIDIUM RB82 GENERATOR (RUBYFILL)
25.0000 | PACK | Freq: Once | INTRAVENOUS | Status: AC
  Administered 2024-03-28: 15:00:00 14.31 via INTRAVENOUS

## 2024-03-28 MED ORDER — REGADENOSON 0.4 MG/5ML IV SOLN
INTRAVENOUS | Status: AC
  Filled 2024-03-28: qty 5

## 2024-03-28 MED ORDER — REGADENOSON 0.4 MG/5ML IV SOLN
0.4000 mg | Freq: Once | INTRAVENOUS | Status: AC
  Administered 2024-03-28: 15:00:00 0.4 mg via INTRAVENOUS
  Filled 2024-03-28: qty 5

## 2024-03-28 NOTE — Addendum Note (Signed)
 Addended by: AVELINA NO E on: 03/28/2024 02:07 PM   Modules accepted: Orders

## 2024-03-28 NOTE — Telephone Encounter (Signed)
 Returned call to [facility] Radiology; Spoke with Radiologist in the reading room; directed me to pt's study under imaging and stated that Impression #1 needs addressing:    Lung: There is a branching nodular pattern in the right lower lobe (image 22 series 4). The nodularity has increased in density compared to CT 02/07/2024. There is a similar pattern in the posterior aspect of the right upper lobe.  **1. Increasing branching nodular pattern in the right lower lobe and posterior right upper lobe, most suggestive of progressive chronic pulmonary infection such as MAI. pulmonology consultation is recommended.  Forwarded report finding to ordering provider Barnie Hila, NP

## 2024-03-28 NOTE — Telephone Encounter (Signed)
 [facility] radiology calling to report CT results.

## 2024-03-28 NOTE — Therapy (Signed)
 OUTPATIENT PHYSICAL THERAPY TREATMENT   Patient Name: Shawn Lam MRN: [id number] DOB:[birth year], 73 y.o., male Today's Date: 03/28/2024  END OF SESSION:  PT End of Session - 03/28/24 0851     Visit Number 17    Number of Visits 27    Date for Recertification  04/30/24    Progress Note Due on Visit 20    PT Start Time 0845    PT Stop Time 0925    PT Time Calculation (min) 40 min    Equipment Utilized During Treatment Gait belt    Activity Tolerance Patient tolerated treatment well    Behavior During Therapy [facility] for tasks assessed/performed          Past Medical History:  Diagnosis Date   Actinic keratosis 02/04/2021   right lateral neck   Anemia    Anxiety    Asymptomatic LV dysfunction    50-55% by echo 06/2016   BCC (basal cell carcinoma of skin) 08/05/2020   left neck infra auricular - nodular pattern - treated with [facility] 09/17/2020   Berger's disease    Blood transfusion without reported diagnosis 04/11/2018   CAD (coronary artery disease) 06/2005   PCI of LAD and RCA   Cancer (HCC)    Cataract 04/11/2016   04/11/2016   Depression    Dilated aortic root    4cm at sinus of Valsalva   Hematuria    Hyperlipidemia    Hypothyroidism    Insomnia    Left ureteral stone    Orthostatic hypotension    negative tilt table   OSA (obstructive sleep apnea)    moderate with AHI 17/hr, uses CPAP nightly(01/15/19 - has Inspire implant)   Osteoporosis 04/11/2016   PVC's (premature ventricular contractions) 07/07/2016   Renal disorder    PT IS SEEING DR. BETSEY- NEPHROLOGIST   Rosacea    Sleep apnea 04/11/2016   04/11/2016   Ulcer 04/11/2018   Urticaria    Past Surgical History:  Procedure Laterality Date   ANTERIOR CERVICAL DECOMP/DISCECTOMY FUSION N/A 03/13/2023   Procedure: CERVICAL FOUR-FIVE, CERVICAL FIVE-SIX ANTERIOR CERVICAL DISCECTOMY/DECOMPRESSION FUSION;  Surgeon: Onetha Kuba, MD;  Location: [facility] OR;  Service: Neurosurgery;  Laterality: N/A;  3C   CARDIAC  CATHETERIZATION  09/06/2013   [facility]   CARDIAC CATHETERIZATION     COLONOSCOPY WITH PROPOFOL  N/A 01/21/2019   Procedure: COLONOSCOPY WITH PROPOFOL ;  Surgeon: Jinny Carmine, MD;  Location: [facility];  Service: Endoscopy;  Laterality: N/A;  sleep apnea   COLONOSCOPY WITH PROPOFOL  N/A 10/19/2021   Procedure: COLONOSCOPY WITH PROPOFOL ;  Surgeon: Jinny Carmine, MD;  Location: [facility] ENDOSCOPY;  Service: Endoscopy;  Laterality: N/A;   CORONARY ANGIOPLASTY  2004   STENT PLACEMENT   CYSTOSCOPY WITH RETROGRADE PYELOGRAM, URETEROSCOPY AND STENT PLACEMENT Left 03/19/2014   Procedure: CYSTOSCOPY WITH RETROGRADE PYELOGRAM, URETEROSCOPY AND STENT PLACEMENT;  Surgeon: Mickey Ricardo KATHEE Alvaro, MD;  Location: [facility];  Service: Urology;  Laterality: Left;   CYSTOSCOPY WITH RETROGRADE PYELOGRAM, URETEROSCOPY AND STENT PLACEMENT Bilateral 05/20/2015   Procedure:  CYSTOSCOPY WITH BILATERAL RETROGRADE PYELOGRAM,RIGHT  DIAGNOSTIC URETEROSCOPY ,LEFT URETEROSCOPY WITH HOLMIUM LASER  AND BILATERAL  STENT PLACEMENT ;  Surgeon: Ricardo Alvaro, MD;  Location: [facility];  Service: Urology;  Laterality: Bilateral;   DRUG INDUCED ENDOSCOPY N/A 10/20/2017   Procedure: DRUG INDUCED SLEEP ENDOSCOPY;  Surgeon: Carlie Clark, MD;  Location: [facility];  Service: ENT;  Laterality: N/A;   EP IMPLANTABLE DEVICE N/A 05/13/2016   Procedure: Loop Recorder Insertion;  Surgeon: Elspeth JAYSON Sage, MD;  Location: [facility] INVASIVE CV LAB;  Service: Cardiovascular;  Laterality: N/A;   ESOPHAGOGASTRODUODENOSCOPY N/A 10/19/2021   Procedure: ESOPHAGOGASTRODUODENOSCOPY (EGD);  Surgeon: Jinny Carmine, MD;  Location: [facility] ENDOSCOPY;  Service: Endoscopy;  Laterality: N/A;   ESOPHAGOGASTRODUODENOSCOPY (EGD) WITH PROPOFOL  N/A 11/30/2018   Procedure: ESOPHAGOGASTRODUODENOSCOPY (EGD) WITH PROPOFOL ;  Surgeon: Therisa Bi, MD;  Location: [facility] ENDOSCOPY;  Service: Gastroenterology;  Laterality: N/A;   ESOPHAGOGASTRODUODENOSCOPY (EGD) WITH PROPOFOL  N/A  12/01/2018   Procedure: ESOPHAGOGASTRODUODENOSCOPY (EGD) WITH PROPOFOL ;  Surgeon: Jinny Carmine, MD;  Location: [facility] ENDOSCOPY;  Service: Endoscopy;  Laterality: N/A;   HOLMIUM LASER APPLICATION Bilateral 05/20/2015   Procedure: HOLMIUM LASER APPLICATION;  Surgeon: Ricardo Likens, MD;  Location: [facility];  Service: Urology;  Laterality: Bilateral;   IMPLIMENTATION OF A HYPOGLOSSAL NERVE STIMULATOR  12/08/2017   OSA    IR GASTROSTOMY TUBE MOD SED  01/18/2024   IR RADIOLOGIST EVAL & MGMT  02/13/2024   LEFT HEART CATHETERIZATION WITH CORONARY ANGIOGRAM N/A 09/06/2013   Procedure: LEFT HEART CATHETERIZATION WITH CORONARY ANGIOGRAM;  Surgeon: Victory LELON Claudene DOUGLAS, MD;  Location: [facility] CATH LAB;  Service: Cardiovascular;  Laterality: N/A;   LEFT KNEE CAP  SURGERY ABOUT 40 YRS AGO  ? 1970     SPINE SURGERY  Desk in neck   STONE EXTRACTION WITH BASKET Left 03/19/2014   Procedure: STONE EXTRACTION WITH BASKET;  Surgeon: Mickey Ricardo KATHEE Likens, MD;  Location: [facility];  Service: Urology;  Laterality: Left;   TRANSURETHRAL RESECTION OF PROSTATE N/A 08/25/2021   Procedure: TRANSURETHRAL RESECTION OF THE PROSTATE (TURP);  Surgeon: Likens Ricardo, MD;  Location: [facility];  Service: Urology;  Laterality: N/A;  1 HR   Patient Active Problem List   Diagnosis Date Noted   S/P gastrostomy tube (G tube) placement, follow-up exam 02/18/2024   Skin infection at gastrostomy tube site (HCC) 02/06/2024   Protein-calorie malnutrition, severe 01/23/2024   Multifocal pneumonia 01/22/2024   Fall 01/03/2024   Hematoma of right hip 01/03/2024   Chronic pulmonary aspiration 12/29/2023   Silent aspiration 12/29/2023   Moderate protein-calorie malnutrition (weight for age 23-74% of standard) 12/29/2023   Bilateral leg weakness 12/29/2023   Chronic respiratory failure with hypoxia, on home oxygen  therapy (HCC) 12/29/2023   Chronic constipation 12/29/2023   Somnolence 12/29/2023   Frequent falls 12/29/2023   Feeding difficulties,  unspecified 12/20/2023   Shortness of breath 12/20/2023   Abnormal CT of the chest 10/10/2023   Elevated blood sugar 06/30/2023   Carotid stenosis 05/11/2023   Spinal stenosis in cervical region 03/13/2023   Osteoarthritis of spine with radiculopathy, cervical region 07/14/2022   Cervicogenic headache 11/30/2021   Chronic neck pain 11/30/2021   Weight loss, non-intentional    Dysphagia    Polyp of colon    Prostatic hyperplasia 08/25/2021   Chronic insomnia 03/19/2020   Peripheral neuropathy 03/19/2020   Iron deficiency anemia 01/06/2020   Allergic dermatitis 07/08/2019   Osteoarthritis of left knee 02/26/2019   History of colonic polyps    History of duodenal ulcer    CKD (chronic kidney disease), stage IIIa    Bilateral chronic knee pain 11/09/2018   Epistaxis 10/02/2018   Subjective tinnitus, bilateral 10/02/2018   Chronic pain of right thumb 02/22/2018   Acute bilateral thoracic back pain 01/23/2018   Osteoporosis without current pathological fracture 02/10/2017   Hypothyroidism 11/02/2016   Thoracic compression fracture (HCC) 09/06/2016   PVC's (premature ventricular contractions) 07/07/2016   Hypoalbuminemia 06/28/2016   IgA  nephropathy 06/07/2016   Acute buttock pain 05/23/2016   MDD (major depressive disorder), recurrent episode, moderate (HCC) 11/20/2015   OSA (obstructive sleep apnea) 02/05/2014   Orthostatic hypotension 02/07/2013   Scotoma involving central area 01/29/2013   Optic neuropathy 01/28/2013   HEMATURIA UNSPECIFIED 01/21/2010   HLD (hyperlipidemia) 07/05/2006   Generalized anxiety disorder 07/05/2006   CAD (coronary artery disease) 07/05/2006   GERD 07/05/2006    PCP: Dr. Greig Ring    REFERRING PROVIDER: Dr. Arley Helling   REFERRING DIAG: M54.6 (ICD-10-CM) - Acute bilateral thoracic back pain  THERAPY DIAG:  Abnormality of gait  Muscle weakness (generalized)  Balance disorder  Pain in thoracic spine  Cervicalgia  Rationale for  Evaluation and Treatment: Rehabilitation  ONSET DATE: May 2025    SUBJECTIVE:                                                                                                                                                                                                         SUBJECTIVE STATEMENT  Pt reports he is doing well, no updates and had a good weekend. Mentions still having occasional dizziness upon standing but nothing major.   PERTINENT HISTORY:   73yoM referred to physical therapy for ongoing neck pain following cervical fusion in May 2020 and newly developed thoracic pain from T12 compression fracture that occurred sometime in Spring of 2025 (associated radicular pain pattern). Pt has pain localized to upper traps and suboccipitals. Pt attempted DC of prednisone  and gabapentin  two weeks prior to evaluation here, severe decline in function due to pain, barely able to walk for two weeks. He describes feeling increased fatigue and weakness to the point where he struggled to walk. Pt also reported repeated falls in the setting of orthostatic hypotension with him experiencing syncope when he stands up from a seated position. He describes feeling a burning sensation that is also localized to his scapulae and that it occurs when he is doing UE activities like washing his car that is likely the radicular pain from thoracic vertebrae compression fracture.  PAIN: Are you having pain? 03/05/2024: 6/10 today in the neck  PRECAUTIONS: None Pt has thoracic compression fractures and is s/p cervical spine surgery   WEIGHT BEARING RESTRICTIONS: No  FALLS:   Has patient fallen in last 6 months? Yes. Number of falls 6, pt reports that falls are happening in the context of syncope when standing up from a seated position (Orthostatic Hypotension) , where he blacks out and falls. He is following up with his PCP, Dr. Karlene who is  working with him on stabilizing his blood pressure and she has recently  taken him off Gabapentin  to see if this helps. Pt reports that he does note an improvement in his symptoms with less light headedness since going off medication.   LIVING ENVIRONMENT: Lives with: lives with their spouse Lives in: House/apartment Stairs: No Has following equipment at home: Single point cane and Environmental Consultant - 4 wheeled  OCCUPATION: Retired     PLOF: Independent  PATIENT GOALS: Improve balance, decrease falls.       OBJECTIVE:   Note: Objective measures were completed at evaluation unless otherwise noted. Reassessment Measures 02/09/24: -eyes closed balance, normal stance: <5sec with retropulsion each time, ModA falls recovery  -narrow stance eyes open balance: <10sec with LOB, ModA falls recovery  -Long distance AMB assessment c SPC: 142ft with 2 DOE standing breaks ~15 seconds each, 15sec; crossover gait intermittently without LOB; -standing vitals taken at end of walk: stable and comparable to prior seated vitals.  -cervical rotation ROM: 50deg Rt, 53 degree left  -shoulder flexion MMT: 5/5 Rt, 4+/5 left; shoulder ABDCT MMT: 5/5 bilat  -ankle DF assessment: can perform >10x full range while leaning against wall (adequate for gait needs)  -sensory testing about ankles, lower legs: (feels intact, but pt reports constance N/T below ankle to his toes)  Standing BP assessment: 02/28/24 -124/4mmHg 95bpm standing @ 0 -122/65mmHg 90bpm standing @ 1 110/58mmHg 92bpm standing @ 2  TREATMENT DATE 03/28/2024:   TA- To improve functional movements patterns for everyday tasks   Nustep level 4-7 rolling hills setting  for B UE and LE reciprocal movement training and for LE strength and endurance. X 9 min. Check ins throughout to ensure appropriate response.   NMR: To facilitate reeducation of movement, balance, posture, coordination, and/or proprioception/kinesthetic sense.  STS with airex under feet tossing 3kg ball to PT - 2 x 10   Airex fwd/retro step over hurdle 2 x  10 ea LE   -VC to shift weight fwd when taking a step  Lateral step over hurdle on airex surface 2 x 10 ea LE   Alternating sliding stepping stones laterally and medially x 3 lap (down and back ~ 15 ft=1 lap) -Pt reported legs began to feel wobbly after second set so seated rest provided before completing third.   TE- To improve strength, endurance, mobility, and function of specific targeted muscle groups or improve joint range of motion or improve muscle flexibility   Incline board heel raises with focus on maintaining balance at end of eccentric portion  3 x 15 reps UE support allowed  Unless otherwise stated, CGA was provided and gait belt donned in order to ensure pt safety  Pt required occasional rest breaks due fatigue, PT was attentive to when pt appeared to be tired or winded in order to prevent excessive fatigue, of note, pt less fatigued and required less rest compared to previous sessions.    PATIENT EDUCATION:  Education details: Pt progressing so far with cardio and balance activity.     Person educated: Patient Education method: Medical Illustrator Education comprehension: verbalized understanding, returned demonstration, and verbal cues required  HOME EXERCISE PROGRAM: Access Code: [email or web address] URL: [email or web address] Date: 03/12/2024 Prepared by: Lonni Gainer  Exercises - Marching Near Counter  - 1 x daily - 7 x weekly - 2 sets - 10 reps - 3 sec hold - Single leg balance near counter   - 1 x daily - 7 x weekly - 3 sets -  30 second hold - Heel Toe Raises with Counter Support  - 1 x daily - 7 x weekly - 3 sets - 10 reps  ASSESSMENT:  CLINICAL IMPRESSION:   Patient arrived with good motivation for completion of PT activities. Pt challenged with more dynamic balance at this session. Pt completed majority of rest breaks in standing compared to sitting demonstrating improved endurance and decreased discomfort. Pt was most challenged this  session during sit to stands on airex and experienced mild LOB that pt was able to recover on own. Pt completed airex activities well and required no UE support throughout. Sliding stepping stones was completed at end of session with pt noting legs becoming fatigued. Pt was provided a seated rest before completing third set which helped improve balance during the last round. Pt will continue to benefit from skilled physical therapy intervention to address impairments, improve QOL, and attain therapy goals.   OBJECTIVE IMPAIRMENTS: decreased balance, decreased ROM, decreased strength, hypomobility, impaired flexibility, impaired UE functional use, and pain.   ACTIVITY LIMITATIONS: carrying, lifting, standing, bathing, dressing, reach over head, and hygiene/grooming PARTICIPATION LIMITATIONS: cleaning, laundry, shopping, community activity, and yard work PERSONAL FACTORS: Age and 3+ comorbidities: CAD, Depression, are also affecting patient's functional outcome.  REHAB POTENTIAL: Good CLINICAL DECISION MAKING: Stable/uncomplicated EVALUATION COMPLEXITY: Low   GOALS: Goals reviewed with patient? No  SHORT TERM GOALS: Target date: 01/03/2024  Patient will demonstrate undestanding of home exercise plan by performing exercises correctly with evidence of good carry over with min to no verbal or tactile cues .   Baseline: NT 12/25/23: Performing exercises independently  Goal status: ACHIEVED   2.  Patient will demonstrate understanding of how to modify exercises to decrease thoracic and cervical pain exacerbation for improved function and to decrease incidence of pain flare ups.  Baseline: Does not currently have strategy to manage this  Goal status: NOT MET      LONG TERM GOALS: Target date: 03/13/2024  Patient will show a statistically significant improvement in his neck function as evidence by >=7.5 pt decrease in her neck disability index score to better be able to move neck to improve visual  field while driving to avoid accidents. Vernel et al, 2009) Baseline: 33/50 (66%); 02/09/24: 66% 03/01/2024: 50% Goal status: NOT MET     2.  Patient will increase cervical mobility by  >=5 degrees AROM for all planes of motion (flexion, extension, side bending, and rotation) for improved cervical function and evidence of pain reduction. Baseline: Cervical AROM Flex 30, Ext 30, Rot R/L 40/40, SB R/L 35/35; 02/09/24: Rt rotation 50 degrees, Left rotation: 53 degress  03/01/2024: Flex:  30*   Ext: 20*      Rot L: 32*     Rot R: 30*      SB L: 20*       SB R: 15* Goal status: MET    3.  Patient will improve shoulder and periscapular strength by 1/3 grade MMT (4- to 4) for improved cervical stability and UE function to carry out upper extremity activities like washing his car and gardening without being limited by pain and discomfort.  Baseline: Shoulder Flex R/L 4/4, Shoulder Abd R/L 4/4; 10/31: shoulder flexion Rt: 5/5, left 4+/5; shoulder ABDCT 5/5 bilat  03/01/2024: Shld Flex R: 5 Shld Flex L: 4+ , Shld abd R: 4 +  Shld Abd L: 4 Goal status: MET    4.  Patient will reduce neck and thoracic pain to <=4/10 NRPS while  performing UE activities like washing his car and doing yard work as evidence of improved cervical and thoracic function.  Baseline: 10/10 cervical paraspinal and rhomboid pain with UE activity; up to a 9/10 intermittently with activity   03/01/2024: 8/10 NPS UE activity Goal status: NOT MET    5. Patient (> 22 years old) will complete five times sit to stand test in < 15 seconds indicating an increased LE strength and improved balance. Baseline: 17.28s Goal status: INITIAL  6. Patient will increase Berg Balance score by > 6 points to demonstrate decreased fall risk during functional activities Baseline: 47/56 Goal status: INITIAL  7. Patient will increase 10 meter walk test to >1.71m/s as to improve gait speed for better community ambulation and to reduce fall risk. Baseline:  normal pace: 0.4m/s; fast pace: 0.975s Goal status: INITIAL  8. Patient will reduce timed up and go to <10 seconds to reduce fall risk and demonstrate improved transfer/gait ability. Baseline: 11.27s Goal status: INITIAL  9. Patient will decrease distance of by 119ft in order to increase cardiovascular endurance for improved community ambulation distance. Baseline: 972ft in 6 min w/ SPC Goal status: INITIAL  10. Pt will increase score on FGA by 4 points in order to increase functional ambulation and increase confidence in community environments. Baseline: 17/30 Goal status: INITIAL PLAN:  PT FREQUENCY: 1-2x/week PT DURATION: 8 weeks PLANNED INTERVENTIONS: [id number]- PT Re-evaluation, 97750- Physical Performance Testing, 97110-Therapeutic exercises, 97530- Therapeutic activity, [id number]- Neuromuscular re-education, 97535- Self Care, [id number]- Manual therapy, [phone]- Gait training, [phone]- Canalith repositioning, [id number]- Aquatic Therapy, [phone]- Electrical stimulation (unattended), [phone]- Electrical stimulation (manual), [id number]- Traction (mechanical), 20560 (1-2 muscles), 20561 (3+ muscles)- Dry Needling, Patient/Family education, Balance training, Joint mobilization, Joint manipulation, Spinal manipulation, Spinal mobilization, Vestibular training, DME instructions, Cryotherapy, and Moist heat  PLAN FOR NEXT SESSION:   -continue to address cervical ROM/pain -address global weakness, deconditioning, and imbalance.   Note: Portions of this document were prepared using Dragon voice recognition software and although reviewed may contain unintentional dictation errors in syntax, grammar, or spelling.  Leonor Rode, [place]   10:48 AM, 03/28/2024  This entire session was performed under direct supervision and direction of a licensed therapist/therapist assistant . I have personally read, edited and approve of the note as written.    This licensed clinician was present and actively directing care  throughout the session at all times.  Lonni KATHEE Gainer PT ,DPT Physical Therapist-   [facility]

## 2024-03-28 NOTE — Therapy (Signed)
 OUTPATIENT SPEECH LANGUAGE PATHOLOGY  SWALLOW TREATMENT     Patient Name: Shawn Lam MRN: [id number] DOB:December 03, 1950, 73 y.o., male Today's Date: 03/28/2024  PCP: Greig Ring, MD REFERRING PROVIDER: Greig Ring, MD    End of Session - 03/28/24 0932     Visit Number 18    Number of Visits 43    Date for Recertification  06/18/24    Authorization Type Medicare Part A    Progress Note Due on Visit 20    SLP Start Time 0930    SLP Stop Time  1005    SLP Time Calculation (min) 35 min    Activity Tolerance Patient tolerated treatment well          Past Medical History:  Diagnosis Date   Actinic keratosis 02/04/2021   right lateral neck   Anemia    Anxiety    Asymptomatic LV dysfunction    50-55% by echo 06/2016   BCC (basal cell carcinoma of skin) 08/05/2020   left neck infra auricular - nodular pattern - treated with [facility] 09/17/2020   Berger's disease    Blood transfusion without reported diagnosis 04/11/2018   CAD (coronary artery disease) 06/2005   PCI of LAD and RCA   Cancer (HCC)    Cataract 04/11/2016   04/11/2016   Depression    Dilated aortic root    4cm at sinus of Valsalva   Hematuria    Hyperlipidemia    Hypothyroidism    Insomnia    Left ureteral stone    Orthostatic hypotension    negative tilt table   OSA (obstructive sleep apnea)    moderate with AHI 17/hr, uses CPAP nightly(01/15/19 - has Inspire implant)   Osteoporosis 04/11/2016   PVC's (premature ventricular contractions) 07/07/2016   Renal disorder    PT IS SEEING DR. BETSEY- NEPHROLOGIST   Rosacea    Sleep apnea 04/11/2016   04/11/2016   Ulcer 04/11/2018   Urticaria    Past Surgical History:  Procedure Laterality Date   ANTERIOR CERVICAL DECOMP/DISCECTOMY FUSION N/A 03/13/2023   Procedure: CERVICAL FOUR-FIVE, CERVICAL FIVE-SIX ANTERIOR CERVICAL DISCECTOMY/DECOMPRESSION FUSION;  Surgeon: Onetha Kuba, MD;  Location: [facility] OR;  Service: Neurosurgery;  Laterality: N/A;  3C   CARDIAC  CATHETERIZATION  09/06/2013   [facility]   CARDIAC CATHETERIZATION     COLONOSCOPY WITH PROPOFOL  N/A 01/21/2019   Procedure: COLONOSCOPY WITH PROPOFOL ;  Surgeon: Jinny Carmine, MD;  Location: [facility];  Service: Endoscopy;  Laterality: N/A;  sleep apnea   COLONOSCOPY WITH PROPOFOL  N/A 10/19/2021   Procedure: COLONOSCOPY WITH PROPOFOL ;  Surgeon: Jinny Carmine, MD;  Location: [facility] ENDOSCOPY;  Service: Endoscopy;  Laterality: N/A;   CORONARY ANGIOPLASTY  2004   STENT PLACEMENT   CYSTOSCOPY WITH RETROGRADE PYELOGRAM, URETEROSCOPY AND STENT PLACEMENT Left 03/19/2014   Procedure: CYSTOSCOPY WITH RETROGRADE PYELOGRAM, URETEROSCOPY AND STENT PLACEMENT;  Surgeon: Mickey Ricardo KATHEE Alvaro, MD;  Location: [facility] ORS;  Service: Urology;  Laterality: Left;   CYSTOSCOPY WITH RETROGRADE PYELOGRAM, URETEROSCOPY AND STENT PLACEMENT Bilateral 05/20/2015   Procedure:  CYSTOSCOPY WITH BILATERAL RETROGRADE PYELOGRAM,RIGHT  DIAGNOSTIC URETEROSCOPY ,LEFT URETEROSCOPY WITH HOLMIUM LASER  AND BILATERAL  STENT PLACEMENT ;  Surgeon: Ricardo Alvaro, MD;  Location: [facility];  Service: Urology;  Laterality: Bilateral;   DRUG INDUCED ENDOSCOPY N/A 10/20/2017   Procedure: DRUG INDUCED SLEEP ENDOSCOPY;  Surgeon: Carlie Clark, MD;  Location: [facility];  Service: ENT;  Laterality: N/A;   EP IMPLANTABLE DEVICE N/A 05/13/2016   Procedure: Loop  Recorder Insertion;  Surgeon: Elspeth JAYSON Sage, MD;  Location: [facility] INVASIVE CV LAB;  Service: Cardiovascular;  Laterality: N/A;   ESOPHAGOGASTRODUODENOSCOPY N/A 10/19/2021   Procedure: ESOPHAGOGASTRODUODENOSCOPY (EGD);  Surgeon: Jinny Carmine, MD;  Location: [facility] ENDOSCOPY;  Service: Endoscopy;  Laterality: N/A;   ESOPHAGOGASTRODUODENOSCOPY (EGD) WITH PROPOFOL  N/A 11/30/2018   Procedure: ESOPHAGOGASTRODUODENOSCOPY (EGD) WITH PROPOFOL ;  Surgeon: Therisa Bi, MD;  Location: [facility] ENDOSCOPY;  Service: Gastroenterology;  Laterality: N/A;   ESOPHAGOGASTRODUODENOSCOPY (EGD) WITH PROPOFOL  N/A  12/01/2018   Procedure: ESOPHAGOGASTRODUODENOSCOPY (EGD) WITH PROPOFOL ;  Surgeon: Jinny Carmine, MD;  Location: [facility] ENDOSCOPY;  Service: Endoscopy;  Laterality: N/A;   HOLMIUM LASER APPLICATION Bilateral 05/20/2015   Procedure: HOLMIUM LASER APPLICATION;  Surgeon: Ricardo Likens, MD;  Location: [facility];  Service: Urology;  Laterality: Bilateral;   IMPLIMENTATION OF A HYPOGLOSSAL NERVE STIMULATOR  12/08/2017   OSA    IR GASTROSTOMY TUBE MOD SED  01/18/2024   IR RADIOLOGIST EVAL & MGMT  02/13/2024   LEFT HEART CATHETERIZATION WITH CORONARY ANGIOGRAM N/A 09/06/2013   Procedure: LEFT HEART CATHETERIZATION WITH CORONARY ANGIOGRAM;  Surgeon: Victory LELON Claudene DOUGLAS, MD;  Location: [facility] CATH LAB;  Service: Cardiovascular;  Laterality: N/A;   LEFT KNEE CAP  SURGERY ABOUT 40 YRS AGO  ? 1970     SPINE SURGERY  Desk in neck   STONE EXTRACTION WITH BASKET Left 03/19/2014   Procedure: STONE EXTRACTION WITH BASKET;  Surgeon: Mickey Ricardo KATHEE Likens, MD;  Location: [facility];  Service: Urology;  Laterality: Left;   TRANSURETHRAL RESECTION OF PROSTATE N/A 08/25/2021   Procedure: TRANSURETHRAL RESECTION OF THE PROSTATE (TURP);  Surgeon: Likens Ricardo, MD;  Location: [facility];  Service: Urology;  Laterality: N/A;  1 HR   Patient Active Problem List   Diagnosis Date Noted   S/P gastrostomy tube (G tube) placement, follow-up exam 02/18/2024   Skin infection at gastrostomy tube site (HCC) 02/06/2024   Protein-calorie malnutrition, severe 01/23/2024   Multifocal pneumonia 01/22/2024   Fall 01/03/2024   Hematoma of right hip 01/03/2024   Chronic pulmonary aspiration 12/29/2023   Silent aspiration 12/29/2023   Moderate protein-calorie malnutrition (weight for age 45-74% of standard) 12/29/2023   Bilateral leg weakness 12/29/2023   Chronic respiratory failure with hypoxia, on home oxygen  therapy (HCC) 12/29/2023   Chronic constipation 12/29/2023   Somnolence 12/29/2023   Frequent falls 12/29/2023   Feeding difficulties,  unspecified 12/20/2023   Shortness of breath 12/20/2023   Abnormal CT of the chest 10/10/2023   Elevated blood sugar 06/30/2023   Carotid stenosis 05/11/2023   Spinal stenosis in cervical region 03/13/2023   Osteoarthritis of spine with radiculopathy, cervical region 07/14/2022   Cervicogenic headache 11/30/2021   Chronic neck pain 11/30/2021   Weight loss, non-intentional    Dysphagia    Polyp of colon    Prostatic hyperplasia 08/25/2021   Chronic insomnia 03/19/2020   Peripheral neuropathy 03/19/2020   Iron deficiency anemia 01/06/2020   Allergic dermatitis 07/08/2019   Osteoarthritis of left knee 02/26/2019   History of colonic polyps    History of duodenal ulcer    CKD (chronic kidney disease), stage IIIa    Bilateral chronic knee pain 11/09/2018   Epistaxis 10/02/2018   Subjective tinnitus, bilateral 10/02/2018   Chronic pain of right thumb 02/22/2018   Acute bilateral thoracic back pain 01/23/2018   Osteoporosis without current pathological fracture 02/10/2017   Hypothyroidism 11/02/2016   Thoracic compression fracture (HCC) 09/06/2016   PVC's (premature ventricular contractions) 07/07/2016   Hypoalbuminemia 06/28/2016  IgA nephropathy 06/07/2016   Acute buttock pain 05/23/2016   MDD (major depressive disorder), recurrent episode, moderate (HCC) 11/20/2015   OSA (obstructive sleep apnea) 02/05/2014   Orthostatic hypotension 02/07/2013   Scotoma involving central area 01/29/2013   Optic neuropathy 01/28/2013   HEMATURIA UNSPECIFIED 01/21/2010   HLD (hyperlipidemia) 07/05/2006   Generalized anxiety disorder 07/05/2006   CAD (coronary artery disease) 07/05/2006   GERD 07/05/2006    ONSET DATE: 03/2023 following ACDF; Date of referral 12/28/2023: date of this referral 02/01/2024  REFERRING DIAG:  T17.908D (ICD-10-CM) - Chronic pulmonary aspiration, subsequent encounter  T17.900D (ICD-10-CM) - Silent aspiration, subsequent encounter  E44.0 (ICD-10-CM) - Moderate  protein-calorie malnutrition  R13.10 (ICD-10-CM) - Dysphagia, unspecified type    THERAPY DIAG:  Dysphagia, oropharyngeal phase  Rationale for Evaluation and Treatment Rehabilitation  SUBJECTIVE:   PERTINENT HISTORY and DIAGNOSTIC FINDINGS:  Pt is a 73 yo male w/ ACDF surgery 03/2023.  PMH: GERD(Not on a PPI per chart), Actinic keratosis (02/04/2021), Anemia, Anxiety, Asymptomatic LV dysfunction, BCC (basal cell carcinoma of skin) (08/05/2020), Berger's disease, CAD (coronary artery disease) (06/2005), resting tremor, Depression, Dilated aortic root (HCC), Hematuria, Hyperlipidemia, Hypothyroidism, Insomnia, Left ureteral stone, Orthostatic hypotension, OSA (obstructive sleep apnea), PVC's (premature ventricular contractions) (07/07/2016), Renal disorder, Rosacea, and Urticaria.  Pt is followed by Cardiology for Coronary Artery Stenosis currently; also followed for Migraines.     EGD in 2023 revealed Gastritis.      CT of Chest 10/2023: 1. Interim development of patchy consolidation and ground-glass   disease most evident at the right middle lobe and right lower lobes   with additional areas of mild ground-glass disease and scattered   clustered nodularity, findings are suspicious for multifocal   infection/pneumonia. Imaging follow-up to resolution is recommended.   2. Emphysema.   3. Aortic atherosclerosis.            Dysphagia - reported by pt as Chronic, ever since neck surgery in December 2024. Pt was seen in the emergency room s/p Fall, notes detailing that he slipped on soap in the shower, fell backwards on right side hit head and neck against shower seat, possible brief loss of consciousness, per chart.  ACDF 03/2023 per chart.  Since the neck surgery in 03/2023, he has noted food, liquids and pill getting stuck on the right side of my throat more.  I used to cough a lot more but not as much now.  I cough up what gets stuck..  He has lost significant weight per chart notes due to  decreased eating.  Pt endorsed using his home O2 when he feels SOB.   MBSS 12/26/2023 Patient appears to present w/ Chronic, moderate-severe Pharyngeal phase dysphagia per this assessment today. It appeared there was some impact from changes in the Cervical Esophagus s/p ACDF surgery(03/2023 per chart notes) including structural/tissue changes in/aournd the CP segment and cervical esophagus. Pt with intermittent silent aspiration of thin liquids.   Recommend continue current diet of easy to eat foods at home; thin liquids via cup- at this time. Oral care Before any oral intake. Swallow Strategies Recommended: No straws, moistened foods, SMALL bites/sips, Head Turn RIGHT w/ slight chin tuck, multiple swallows to aid clearing, AND strong Cough/throat clear b/t bites/sips and at end of meal.  D/t the Chronicity of pt's Pharyngeal phase Dysphagia w/ weight loss and pulmonary impact per recent Chest CT, pt and Family may need to discuss alternative means of nutrition/hydration as support w/ his Medical Team/PCP, while seeking swallow  therapy.  Received ENT report dated 01/12/2024 Laryngoscopy revealed  Moderate vocal fold bowing bilaterally and moderate glottic gamp with phonation Impression/plan: Aspiration on MBSS and significant weight loss since ACDF last year. TVF shows moderate sized glottic gap on laryngoscoy but mobile vocal folds bilaterally. Pooling in pyriform on MBSS but no significant pooling of secretions on laryngoscopy. I feel that between speech therpay and possibly bilateral injection laryngoplasty, hopefully his swallowing will significantly improve. Will make referral to [facility] Brien at [facility].   Pt and his wife report appt with Dr Brien in January 2026.  10/10/205 PEG placed  01/22/2024 thru 01/24/2024 Hospitalized d/t fall and confusion CT of the chest on arrival showed multifocal pneumonia and possible free air. When reviewed further with interventional radiology team it was  determined this was an appropriate amount of free air secondary to recent PEG tube placement. Patient was initiated on antibiotic therapy for pneumonia and improved significantly.   02/07/2024 CT Chest 1. Evolving multifocal inflammatory infiltrates, progressive within the right upper lobe and improved within the lung bases bilaterally. Recommend appropriate clinical management and imaging follow-up per clinical course. 2. Mild emphysema. Given emphysema as an independent lung cancer risk factor, consider evaluation for a low-dose CT lung cancer screening program if the patient is 73 years old and otherwise eligible.   PAIN:  Are you having pain? No  FALLS: Has patient fallen in last 6 months?  See PT evaluation for details  LIVING ENVIRONMENT: Lives with: lives with their spouse Lives in: House/apartment  PLOF:  Level of assistance: Independent with ADLs, Independent with IADLs Employment: Retired   PATIENT GOALS  to improve swallow function, nutrition and hydration, decrease aspiration risks  SUBJECTIVE STATEMENT: Pt pleasant, pt reports that he has an appt with PCP tomorrow to discuss nutrition Pt accompanied by: self   OBJECTIVE:   TODAY'S TREATMENT:  Skilled treatment session focused on pt's dysphagia goals. SLP facilitated the session by providing the following interventions:       Pt reports that he hasn't heard form [place].     Respiratory Muscle Strengthening: Pt independently able to maneuver devices correctly. EMST 75 set to 55 cm H2O: Pt able to complete three sets of 6 reps independently.  IMST set to 55 cm H2O: Pt able to complete 3 sets of 4 reps independently.  Pharyngeal exercises: Masako: Pt able to complete 3 sets of 8 reps independently.  Head Extension Swallowing Exercise - Pt able to complete 3 sets of 8 repetitions independently.  CTAR: Pt able to independently complete 3 sets of 8 reps.   PO consumption: Skilled observation was provided of  pt consuming thin liquids via cup. Pt with constant throat clear during consumption and throughout pharyngeal strengthening activities.    PATIENT EDUCATION: Education details: as above Person educated: Patient and Spouse Education method: Chief Technology Officer Education comprehension: needs further education  HOME EXERCISE PROGRAM:     See above   GOALS: Goals reviewed with patient? Yes  SHORT TERM GOALS: Target date: 10 sessions  Updated 02/28/2024  Updated 02/09/2024 With Mod I, pt will perform compensatory swallow strategies (chin tuck, multiple swallows, head turn) to eliminate s/s of aspiration when consuming least restrictive diet. Baseline: Goal status: INITIAL: moderate A: Progress made, fluctuating between supervision and independent cues  2.  With Min A, pt will complete (supraglottic, swallow, Mednelson maneuver, effortful swallow) to improve oral motor weakness, tongue base retraction, hyolarnygeal excursion, airway protection and/or clearance of the bolus through the pharynx.  Baseline:  Goal status: INITIAL: progress made; moderate A: Progress made, fluctuating between supervision and independent cues  3. Pt will demonstrate overt s/s of aspiration when consuming ice chips.   Baseline: Goal status: INITIAL: no overt s/s of aspiration observed during today's session: Intermittent overt s/s observed   LONG TERM GOALS: Target date: 03/25/2024  Updated 02/28/2024  Updated: 02/09/2024 With Mod I, pt will demonstrate the ability to adequately self-monitor swallowing skills and perform appropriate compensatory techniques to reduce s/s of aspiration and to safely consume least restrictive diet.     Baseline:  Goal status: INITIAL: continues to be appropriate goal  2.  Pt will participate in repeat instrumental swallow evaluation to assess least restrictive diet.  Baseline:  Goal status: INITIAL: continues to be appropriate goal: Modified Barium scheduled for  11/21   ASSESSMENT:  CLINICAL IMPRESSION: Patient is a 73 y.o. male who was seen today for a clinical swallow re-evaluation d/t severe pharyngeal phase dysphagia following ACDF (03/2023).    See the above treatment note for details.   OBJECTIVE IMPAIRMENTS include dysphagia. These impairments are limiting patient from safety when swallowing. Factors affecting potential to achieve goals and functional outcome are severity of impairments and malnutrition. Patient will benefit from skilled SLP services to address above impairments and improve overall function.  REHAB POTENTIAL: Good  PLAN: SLP FREQUENCY: 1-2x/week  SLP DURATION: 12 weeks  PLANNED INTERVENTIONS: Aspiration precaution training, Pharyngeal strengthening exercises, Diet toleration management , Oral motor exercises, SLP instruction and feedback, Compensatory strategies, and Patient/family education   Damani Rando B. Rubbie, M.S., CCC-SLP, Tree Surgeon Certified Brain Injury Specialist [facility]  [facility] Rehabilitation Services Office [phone] Ascom [phone] Fax [phone]

## 2024-03-28 NOTE — Addendum Note (Signed)
 Addended by: ISADORA RAISIN on: 03/28/2024 02:22 PM   Modules accepted: Orders

## 2024-03-29 ENCOUNTER — Ambulatory Visit: Payer: Self-pay | Admitting: Student

## 2024-03-29 ENCOUNTER — Encounter: Payer: Self-pay | Admitting: Family Medicine

## 2024-03-29 ENCOUNTER — Ambulatory Visit: Admitting: Family Medicine

## 2024-03-29 VITALS — BP 82/56 | HR 73 | Temp 98.8°F | Ht 72.0 in | Wt 119.4 lb

## 2024-03-29 DIAGNOSIS — I951 Orthostatic hypotension: Secondary | ICD-10-CM | POA: Diagnosis not present

## 2024-03-29 DIAGNOSIS — R634 Abnormal weight loss: Secondary | ICD-10-CM

## 2024-03-29 DIAGNOSIS — N1831 Chronic kidney disease, stage 3a: Secondary | ICD-10-CM

## 2024-03-29 DIAGNOSIS — L929 Granulomatous disorder of the skin and subcutaneous tissue, unspecified: Secondary | ICD-10-CM

## 2024-03-29 DIAGNOSIS — R942 Abnormal results of pulmonary function studies: Secondary | ICD-10-CM | POA: Diagnosis not present

## 2024-03-29 LAB — NM PET CT CARDIAC PERFUSION MULTI W/ABSOLUTE BLOODFLOW
LV dias vol: 89 mL (ref 62–150)
MBFR: 2.73
Nuc Rest EF: 53 %
Nuc Stress EF: 64 %
Peak HR: 74 {beats}/min
Rest HR: 63 {beats}/min
Rest MBF: 0.86 ml/g/min
Rest Nuclear Isotope Dose: 14.3 mCi
SRS: 0
SSS: 0
ST Depression (mm): 0 mm
Stress MBF: 2.35 ml/g/min
Stress Nuclear Isotope Dose: 14.3 mCi
TID: 1.07

## 2024-03-29 NOTE — Assessment & Plan Note (Signed)
 Chronic, followed by cardiology.  Now on midodrine  10 mg 3 times a day.  I will contact cardiology nurse practitioner to see if given continued hypotension and dizziness whether she thinks we should increase the dose. Also will ask her opinion on stopping finasteride , may need to message urology to determine if this is possible given possible side effect/orthostatic hypotension side effect.

## 2024-03-29 NOTE — Assessment & Plan Note (Signed)
 Acute, no sign of infection at G-tube site.  There is an area of granulation tissue.  Recommended continued use of G-tube pad to cushion area, recommended starting Granulotion to help with irritation and reduce granulation tissue.  If not improving we can consider using steroid cream or silver  nitrate.

## 2024-03-29 NOTE — Assessment & Plan Note (Signed)
 Given minimal weight gain with the G-tube, I have contacted the nutritionist and forwarded labs.  She has recommended changing to a renal diet with increased calorie intake: [place] per day  would provide 2520 calories per day.  Prescription for this was faxed to [facility] home health.   Lab reevaluation will be done in approximately 2 weeks. Patient continues speech therapy.

## 2024-03-29 NOTE — Progress Notes (Signed)
 "   Patient ID: Shawn Lam, male    DOB: Jan 13, 1951, 73 y.o.   MRN: [id number]  This visit was conducted in person.  BP (!) 82/56   Pulse 73   Temp 98.8 F (37.1 C) (Temporal)   Ht 6' (1.829 m)   Wt 119 lb 6 oz (54.1 kg)   SpO2 97%   BMI 16.19 kg/m    CC:  Chief Complaint  Patient presents with   Drainage from Incision    Subjective:   HPI: Shawn Lam is a 73 y.o. male presenting on 03/29/2024 for Drainage from Incision  Patient with gastrostomy tube placement for unintentional weight loss secondary to aspiration. Recent lab work showed slightly elevated phosphorus, otherwise normal electrolytes and elevated liver function test (after statin held these improved some) Patient has had minimal weight gain... And now has started losing weight.  Weight at 119 lbs.  Information provided to nutritionist who has changed as of today his tube feeds to a higher calorie formula that is safe for renal patients.   BP low in office today but  has issues with hypotension.SABRA on midorine.. no longer on  HTN meds.  Pt it taking  in 100 cc each time he feeds.. total 600 cc each day  He has follow-up scheduled for his physical early in January with repeat of labs prior.  He is also noted  some bloody mucus on  at G-tube site, burns at times    PET scan  for heart showed  nml cardiac findings but... Lung: There is a branching nodular pattern in the right lower lobe (image 22 series 4). The nodularity has increased in density compared to CT 02/07/2024. There is a similar pattern in the posterior aspect of the right upper lobe.   No fever, no cough, no chest pain.  Relevant past medical, surgical, family and social history reviewed and updated as indicated. Interim medical history since our last visit reviewed. Allergies and medications reviewed and updated. Outpatient Medications Prior to Visit  Medication Sig Dispense Refill   aspirin  81 MG chewable tablet Place 1 tablet (81 mg  total) into feeding tube daily.     busPIRone  (BUSPAR ) 10 MG tablet Place 2 tablets (20 mg total) into feeding tube 3 (three) times daily.     Cholecalciferol  (VITAMIN D3) 50 MCG (2000 UT) TABS Place 1 tablet (2,000 Units total) into feeding tube daily.     clopidogrel  (PLAVIX ) 75 MG tablet Place 1 tablet (75 mg total) into feeding tube daily.     denosumab  (PROLIA ) 60 MG/ML SOSY injection Inject 60 mg into the skin every 6 (six) months. Do not send RX, order this from the office     DULoxetine  (CYMBALTA ) 30 MG capsule TAKE THREE CAPSULES BY MOUTH DAILY 90 capsule 5   Erenumab-aooe (AIMOVIG) 140 MG/ML SOAJ 140 mg every 28 (twenty-eight) days.     finasteride  (PROSCAR ) 5 MG tablet Take 5 mg by mouth daily.     levothyroxine  (SYNTHROID ) 88 MCG tablet Place 1 tablet (88 mcg total) into feeding tube daily before breakfast.     midodrine  (PROAMATINE ) 5 MG tablet Take 1 tablet (5 mg total) by mouth 3 (three) times daily as needed. For Systolic Blood Pressure (Top number) of less than 115 270 tablet 3   nitroGLYCERIN  (NITROSTAT ) 0.4 MG SL tablet Place 1 tablet (0.4 mg total) under the tongue every 5 (five) minutes as needed for chest pain. 25 tablet 3   Nutritional Supplements (FEEDING  SUPPLEMENT, OSMOLITE 1.5 CAL,) LIQD Place 120 mLs into feeding tube 6 (six) times daily.     triamcinolone  cream (KENALOG ) 0.1 % APPLY 2 TOPICALLY DAILY , DO NOT APPLY ON DACE , GROIN AND UNDERARMS. 454 g 0   Water  For Irrigation, Sterile (FREE WATER ) SOLN Place 100 mLs into feeding tube 6 (six) times daily.     zolpidem  (AMBIEN ) 10 MG tablet TAKE 1 TABLET BY MOUTH EVERY NIGHT AT BEDTIME AS NEEDED FOR SLEEP 30 tablet 1   cyanocobalamin  1000 MCG tablet Place 1 tablet (1,000 mcg total) into feeding tube daily.     metoprolol  tartrate (LOPRESSOR ) 25 mg/10 mL SUSP Place 4 mLs (10 mg total) into feeding tube 2 (two) times daily.     atorvastatin  (LIPITOR ) 80 MG tablet Place 1 tablet (80 mg total) into feeding tube daily.      baclofen  (LIORESAL ) 10 MG tablet Place 1 tablet (10 mg total) into feeding tube 2 (two) times daily. (Patient not taking: Reported on 03/27/2024)     buPROPion  (WELLBUTRIN  XL) 150 MG 24 hr tablet Take 2 tablets (300 mg total) by mouth daily.     zonisamide  (ZONEGRAN ) 25 MG capsule Take 1 capsule (25 mg total) by mouth daily. 4 capsule 0   No facility-administered medications prior to visit.     Per HPI unless specifically indicated in ROS section below Review of Systems  Constitutional:  Negative for fatigue and fever.  HENT:  Negative for ear pain.   Eyes:  Negative for pain.  Respiratory:  Positive for shortness of breath. Negative for cough.   Cardiovascular:  Negative for chest pain, palpitations and leg swelling.  Gastrointestinal:  Negative for abdominal pain.  Genitourinary:  Negative for dysuria.  Musculoskeletal:  Negative for arthralgias.  Neurological:  Positive for light-headedness. Negative for syncope and headaches.  Psychiatric/Behavioral:  Negative for dysphoric mood.    Objective:  BP (!) 82/56   Pulse 73   Temp 98.8 F (37.1 C) (Temporal)   Ht 6' (1.829 m)   Wt 119 lb 6 oz (54.1 kg)   SpO2 97%   BMI 16.19 kg/m   Wt Readings from Last 3 Encounters:  03/29/24 119 lb 6 oz (54.1 kg)  03/27/24 119 lb 12.8 oz (54.3 kg)  03/21/24 120 lb 3.2 oz (54.5 kg)      Physical Exam Vitals reviewed.  Constitutional:      Appearance: He is well-developed. He is cachectic.  HENT:     Head: Normocephalic.     Right Ear: Hearing normal.     Left Ear: Hearing normal.     Nose: Nose normal.  Neck:     Thyroid : No thyroid  mass or thyromegaly.     Vascular: No carotid bruit.     Trachea: Trachea normal.  Cardiovascular:     Rate and Rhythm: Normal rate and regular rhythm.     Pulses: Normal pulses.     Heart sounds: Heart sounds not distant. No murmur heard.    No friction rub. No gallop.     Comments: No peripheral edema Pulmonary:     Effort: Pulmonary effort is  normal. No respiratory distress.     Breath sounds: Normal breath sounds.  Skin:    General: Skin is warm and dry.     Findings: No rash.     Comments: Small area of granulation tissue at 7 o'clock, minimal erythema, no odor.  Psychiatric:        Speech: Speech normal.  Behavior: Behavior normal.        Thought Content: Thought content normal.       Results for orders placed or performed during the hospital encounter of 03/28/24  NM PET CT CARDIAC PERFUSION MULTI W/ABSOLUTE BLOODFLOW   Collection Time: 03/28/24  2:56 PM  Result Value Ref Range   Rest Nuclear Isotope Dose 14.3 mCi   Stress Nuclear Isotope Dose 14.3 mCi   Rest HR 63.0 bpm   Rest BP 107/67 mmHg   Peak HR 74 bpm   Peak BP 97/51 mmHg   SSS 0.0    SRS 0.0    TID 1.07    Nuc Stress EF 64 %   Nuc Rest EF 53 %   ST Depression (mm) 0 mm   LV dias vol 89.0 62 - 150 mL   Rest MBF 0.86 ml/g/min   Stress MBF 2.35 ml/g/min   MBFR 2.73    *Note: Due to a large number of results and/or encounters for the requested time period, some results have not been displayed. A complete set of results can be found in Results Review.    Assessment and Plan  Granulation tissue of site of gastrostomy Assessment & Plan: Acute, no sign of infection at G-tube site.  There is an area of granulation tissue.  Recommended continued use of G-tube pad to cushion area, recommended starting Granulotion to help with irritation and reduce granulation tissue.  If not improving we can consider using steroid cream or silver  nitrate.   Weight loss, non-intentional Assessment & Plan: Given minimal weight gain with the G-tube, I have contacted the nutritionist and forwarded labs.  She has recommended changing to a renal diet with increased calorie intake: Suplena per day  would provide 2520 calories per day.  Prescription for this was faxed to [facility] home health.   Lab reevaluation will be done in approximately 2 weeks. Patient continues  speech therapy.    Abnormal PET scan of lung Assessment & Plan: Recent PET scan to evaluate heart showed normal cardiac findings but did show some branching nodular pattern in the right lower lobe, increased nodularity compared to CT February 07, 2024.  I will forward this information to his pulmonologist to determine if further evaluation needs to be done.  As of now CT scan repeat is planned in end of January.   Orthostatic hypotension Assessment & Plan: Chronic, followed by cardiology.  Now on midodrine  10 mg 3 times a day.  I will contact cardiology nurse practitioner to see if given continued hypotension and dizziness whether she thinks we should increase the dose. Also will ask her opinion on stopping finasteride , may need to message urology to determine if this is possible given possible side effect/orthostatic hypotension side effect.   Stage 3a chronic kidney disease (HCC) Assessment & Plan: Chronic, at last check stable renal function.  Dr. Lateef has recommended 1000 cc of free water  per day.  Patient initially reported additional free water  in addition to the 600 cc.  I will verify that he is getting adequate free water      No follow-ups on file.   Greig Ring, MD  "

## 2024-03-29 NOTE — Assessment & Plan Note (Signed)
 Recent PET scan to evaluate heart showed normal cardiac findings but did show some branching nodular pattern in the right lower lobe, increased nodularity compared to CT February 07, 2024.  I will forward this information to his pulmonologist to determine if further evaluation needs to be done.  As of now CT scan repeat is planned in end of January.

## 2024-03-29 NOTE — Assessment & Plan Note (Signed)
 Chronic, at last check stable renal function.  Dr. Lateef has recommended 1000 cc of free water  per day.  Patient initially reported additional free water  in addition to the 600 cc.  I will verify that he is getting adequate free water 

## 2024-03-31 ENCOUNTER — Encounter: Payer: Self-pay | Admitting: Family Medicine

## 2024-04-01 ENCOUNTER — Other Ambulatory Visit: Payer: Self-pay | Admitting: Family Medicine

## 2024-04-01 DIAGNOSIS — G4733 Obstructive sleep apnea (adult) (pediatric): Secondary | ICD-10-CM | POA: Diagnosis not present

## 2024-04-01 NOTE — Progress Notes (Signed)
 Last read by Elsie MARLA Shaunna Cherene at 12:45PM on 03/29/2024.

## 2024-04-02 ENCOUNTER — Ambulatory Visit: Admitting: Physical Therapy

## 2024-04-02 ENCOUNTER — Ambulatory Visit: Admitting: Speech Pathology

## 2024-04-02 ENCOUNTER — Encounter: Admitting: Speech Pathology

## 2024-04-02 ENCOUNTER — Encounter: Payer: Self-pay | Admitting: Neurology

## 2024-04-02 ENCOUNTER — Ambulatory Visit

## 2024-04-02 DIAGNOSIS — M6281 Muscle weakness (generalized): Secondary | ICD-10-CM

## 2024-04-02 DIAGNOSIS — R269 Unspecified abnormalities of gait and mobility: Secondary | ICD-10-CM

## 2024-04-02 DIAGNOSIS — R2689 Other abnormalities of gait and mobility: Secondary | ICD-10-CM

## 2024-04-02 DIAGNOSIS — M546 Pain in thoracic spine: Secondary | ICD-10-CM

## 2024-04-02 DIAGNOSIS — R1312 Dysphagia, oropharyngeal phase: Secondary | ICD-10-CM

## 2024-04-02 NOTE — Therapy (Signed)
 " OUTPATIENT PHYSICAL THERAPY TREATMENT   Patient Name: Shawn Lam MRN: [id number] DOB:February 25, 1951, 73 y.o., male Today's Date: 04/02/2024  END OF SESSION:  PT End of Session - 04/02/24 0856     Visit Number 18    Number of Visits 27    Date for Recertification  04/30/24    Progress Note Due on Visit 20    PT Start Time 0845    PT Stop Time 0928    PT Time Calculation (min) 43 min    Equipment Utilized During Treatment Gait belt    Activity Tolerance Patient tolerated treatment well    Behavior During Therapy [facility] for tasks assessed/performed           Past Medical History:  Diagnosis Date   Actinic keratosis 02/04/2021   right lateral neck   Anemia    Anxiety    Asymptomatic LV dysfunction    50-55% by echo 06/2016   BCC (basal cell carcinoma of skin) 08/05/2020   left neck infra auricular - nodular pattern - treated with [facility] 09/17/2020   Berger's disease    Blood transfusion without reported diagnosis 04/11/2018   CAD (coronary artery disease) 06/2005   PCI of LAD and RCA   Cancer (HCC)    Cataract 04/11/2016   04/11/2016   Depression    Dilated aortic root    4cm at sinus of Valsalva   Hematuria    Hyperlipidemia    Hypothyroidism    Insomnia    Left ureteral stone    Orthostatic hypotension    negative tilt table   OSA (obstructive sleep apnea)    moderate with AHI 17/hr, uses CPAP nightly(01/15/19 - has Inspire implant)   Osteoporosis 04/11/2016   PVC's (premature ventricular contractions) 07/07/2016   Renal disorder    PT IS SEEING DR. BETSEY- NEPHROLOGIST   Rosacea    Sleep apnea 04/11/2016   04/11/2016   Ulcer 04/11/2018   Urticaria    Past Surgical History:  Procedure Laterality Date   ANTERIOR CERVICAL DECOMP/DISCECTOMY FUSION N/A 03/13/2023   Procedure: CERVICAL FOUR-FIVE, CERVICAL FIVE-SIX ANTERIOR CERVICAL DISCECTOMY/DECOMPRESSION FUSION;  Surgeon: Onetha Kuba, MD;  Location: [facility] OR;  Service: Neurosurgery;  Laterality: N/A;  3C   CARDIAC  CATHETERIZATION  09/06/2013   [facility]   CARDIAC CATHETERIZATION     COLONOSCOPY WITH PROPOFOL  N/A 01/21/2019   Procedure: COLONOSCOPY WITH PROPOFOL ;  Surgeon: Jinny Carmine, MD;  Location: [facility];  Service: Endoscopy;  Laterality: N/A;  sleep apnea   COLONOSCOPY WITH PROPOFOL  N/A 10/19/2021   Procedure: COLONOSCOPY WITH PROPOFOL ;  Surgeon: Jinny Carmine, MD;  Location: [facility] ENDOSCOPY;  Service: Endoscopy;  Laterality: N/A;   CORONARY ANGIOPLASTY  2004   STENT PLACEMENT   CYSTOSCOPY WITH RETROGRADE PYELOGRAM, URETEROSCOPY AND STENT PLACEMENT Left 03/19/2014   Procedure: CYSTOSCOPY WITH RETROGRADE PYELOGRAM, URETEROSCOPY AND STENT PLACEMENT;  Surgeon: Mickey Ricardo KATHEE Alvaro, MD;  Location: [facility] ORS;  Service: Urology;  Laterality: Left;   CYSTOSCOPY WITH RETROGRADE PYELOGRAM, URETEROSCOPY AND STENT PLACEMENT Bilateral 05/20/2015   Procedure:  CYSTOSCOPY WITH BILATERAL RETROGRADE PYELOGRAM,RIGHT  DIAGNOSTIC URETEROSCOPY ,LEFT URETEROSCOPY WITH HOLMIUM LASER  AND BILATERAL  STENT PLACEMENT ;  Surgeon: Ricardo Alvaro, MD;  Location: [facility];  Service: Urology;  Laterality: Bilateral;   DRUG INDUCED ENDOSCOPY N/A 10/20/2017   Procedure: DRUG INDUCED SLEEP ENDOSCOPY;  Surgeon: Carlie Clark, MD;  Location: [facility];  Service: ENT;  Laterality: N/A;   EP IMPLANTABLE DEVICE N/A 05/13/2016   Procedure: Loop  Recorder Insertion;  Surgeon: Elspeth JAYSON Sage, MD;  Location: [facility] INVASIVE CV LAB;  Service: Cardiovascular;  Laterality: N/A;   ESOPHAGOGASTRODUODENOSCOPY N/A 10/19/2021   Procedure: ESOPHAGOGASTRODUODENOSCOPY (EGD);  Surgeon: Jinny Carmine, MD;  Location: [facility] ENDOSCOPY;  Service: Endoscopy;  Laterality: N/A;   ESOPHAGOGASTRODUODENOSCOPY (EGD) WITH PROPOFOL  N/A 11/30/2018   Procedure: ESOPHAGOGASTRODUODENOSCOPY (EGD) WITH PROPOFOL ;  Surgeon: Therisa Bi, MD;  Location: [facility] ENDOSCOPY;  Service: Gastroenterology;  Laterality: N/A;   ESOPHAGOGASTRODUODENOSCOPY (EGD) WITH PROPOFOL  N/A  12/01/2018   Procedure: ESOPHAGOGASTRODUODENOSCOPY (EGD) WITH PROPOFOL ;  Surgeon: Jinny Carmine, MD;  Location: [facility] ENDOSCOPY;  Service: Endoscopy;  Laterality: N/A;   HOLMIUM LASER APPLICATION Bilateral 05/20/2015   Procedure: HOLMIUM LASER APPLICATION;  Surgeon: Ricardo Likens, MD;  Location: [facility];  Service: Urology;  Laterality: Bilateral;   IMPLIMENTATION OF A HYPOGLOSSAL NERVE STIMULATOR  12/08/2017   OSA    IR GASTROSTOMY TUBE MOD SED  01/18/2024   IR RADIOLOGIST EVAL & MGMT  02/13/2024   LEFT HEART CATHETERIZATION WITH CORONARY ANGIOGRAM N/A 09/06/2013   Procedure: LEFT HEART CATHETERIZATION WITH CORONARY ANGIOGRAM;  Surgeon: Victory LELON Claudene DOUGLAS, MD;  Location: [facility] CATH LAB;  Service: Cardiovascular;  Laterality: N/A;   LEFT KNEE CAP  SURGERY ABOUT 40 YRS AGO  ? 1970     SPINE SURGERY  Desk in neck   STONE EXTRACTION WITH BASKET Left 03/19/2014   Procedure: STONE EXTRACTION WITH BASKET;  Surgeon: Mickey Ricardo KATHEE Likens, MD;  Location: [facility];  Service: Urology;  Laterality: Left;   TRANSURETHRAL RESECTION OF PROSTATE N/A 08/25/2021   Procedure: TRANSURETHRAL RESECTION OF THE PROSTATE (TURP);  Surgeon: Likens Ricardo, MD;  Location: [facility];  Service: Urology;  Laterality: N/A;  1 HR   Patient Active Problem List   Diagnosis Date Noted   Abnormal PET scan of lung 03/29/2024   S/P gastrostomy tube (G tube) placement, follow-up exam 02/18/2024   Granulation tissue of site of gastrostomy 02/06/2024   Protein-calorie malnutrition, severe 01/23/2024   Multifocal pneumonia 01/22/2024   Fall 01/03/2024   Hematoma of right hip 01/03/2024   Chronic pulmonary aspiration 12/29/2023   Silent aspiration 12/29/2023   Moderate protein-calorie malnutrition (weight for age 70-74% of standard) 12/29/2023   Bilateral leg weakness 12/29/2023   Chronic respiratory failure with hypoxia, on home oxygen  therapy (HCC) 12/29/2023   Chronic constipation 12/29/2023   Somnolence 12/29/2023   Frequent falls  12/29/2023   Feeding difficulties, unspecified 12/20/2023   Shortness of breath 12/20/2023   Abnormal CT of the chest 10/10/2023   Elevated blood sugar 06/30/2023   Carotid stenosis 05/11/2023   Spinal stenosis in cervical region 03/13/2023   Osteoarthritis of spine with radiculopathy, cervical region 07/14/2022   Cervicogenic headache 11/30/2021   Chronic neck pain 11/30/2021   Weight loss, non-intentional    Dysphagia    Polyp of colon    Prostatic hyperplasia 08/25/2021   Chronic insomnia 03/19/2020   Peripheral neuropathy 03/19/2020   Iron deficiency anemia 01/06/2020   Allergic dermatitis 07/08/2019   Osteoarthritis of left knee 02/26/2019   History of colonic polyps    History of duodenal ulcer    CKD (chronic kidney disease), stage IIIa    Bilateral chronic knee pain 11/09/2018   Epistaxis 10/02/2018   Subjective tinnitus, bilateral 10/02/2018   Chronic pain of right thumb 02/22/2018   Acute bilateral thoracic back pain 01/23/2018   Osteoporosis without current pathological fracture 02/10/2017   Hypothyroidism 11/02/2016   Thoracic compression fracture (HCC) 09/06/2016   PVC's (premature  ventricular contractions) 07/07/2016   Hypoalbuminemia 06/28/2016   IgA nephropathy 06/07/2016   Acute buttock pain 05/23/2016   MDD (major depressive disorder), recurrent episode, moderate (HCC) 11/20/2015   OSA (obstructive sleep apnea) 02/05/2014   Orthostatic hypotension 02/07/2013   Scotoma involving central area 01/29/2013   Optic neuropathy 01/28/2013   HEMATURIA UNSPECIFIED 01/21/2010   HLD (hyperlipidemia) 07/05/2006   Generalized anxiety disorder 07/05/2006   CAD (coronary artery disease) 07/05/2006   GERD 07/05/2006    PCP: Dr. Greig Ring    REFERRING PROVIDER: Dr. Arley Helling   REFERRING DIAG: M54.6 (ICD-10-CM) - Acute bilateral thoracic back pain  THERAPY DIAG:  Abnormality of gait  Muscle weakness (generalized)  Balance disorder  Pain in thoracic  spine  Rationale for Evaluation and Treatment: Rehabilitation  ONSET DATE: May 2025    SUBJECTIVE:                                                                                                                                                                                                         SUBJECTIVE STATEMENT  Pt reports he is doing well, no updates and had a good weekend. Mentions still having occasional dizziness upon standing but nothing major.   PERTINENT HISTORY:   73yoM referred to physical therapy for ongoing neck pain following cervical fusion in May 2020 and newly developed thoracic pain from T12 compression fracture that occurred sometime in Spring of 2025 (associated radicular pain pattern). Pt has pain localized to upper traps and suboccipitals. Pt attempted DC of prednisone  and gabapentin  two weeks prior to evaluation here, severe decline in function due to pain, barely able to walk for two weeks. He describes feeling increased fatigue and weakness to the point where he struggled to walk. Pt also reported repeated falls in the setting of orthostatic hypotension with him experiencing syncope when he stands up from a seated position. He describes feeling a burning sensation that is also localized to his scapulae and that it occurs when he is doing UE activities like washing his car that is likely the radicular pain from thoracic vertebrae compression fracture.  PAIN: Are you having pain? 03/05/2024: 6/10 today in the neck  PRECAUTIONS: None Pt has thoracic compression fractures and is s/p cervical spine surgery   WEIGHT BEARING RESTRICTIONS: No  FALLS:   Has patient fallen in last 6 months? Yes. Number of falls 6, pt reports that falls are happening in the context of syncope when standing up from a seated position (Orthostatic Hypotension) , where he blacks out and falls. He is following  up with his PCP, Dr. Karlene who is working with him on stabilizing his blood pressure  and she has recently taken him off Gabapentin  to see if this helps. Pt reports that he does note an improvement in his symptoms with less light headedness since going off medication.   LIVING ENVIRONMENT: Lives with: lives with their spouse Lives in: House/apartment Stairs: No Has following equipment at home: Single point cane and Environmental Consultant - 4 wheeled  OCCUPATION: Retired     PLOF: Independent  PATIENT GOALS: Improve balance, decrease falls.       OBJECTIVE:   Note: Objective measures were completed at evaluation unless otherwise noted. Reassessment Measures 02/09/24: -eyes closed balance, normal stance: <5sec with retropulsion each time, ModA falls recovery  -narrow stance eyes open balance: <10sec with LOB, ModA falls recovery  -Long distance AMB assessment c SPC: 118ft with 2 DOE standing breaks ~15 seconds each, 15sec; crossover gait intermittently without LOB; -standing vitals taken at end of walk: stable and comparable to prior seated vitals.  -cervical rotation ROM: 50deg Rt, 53 degree left  -shoulder flexion MMT: 5/5 Rt, 4+/5 left; shoulder ABDCT MMT: 5/5 bilat  -ankle DF assessment: can perform >10x full range while leaning against wall (adequate for gait needs)  -sensory testing about ankles, lower legs: (feels intact, but pt reports constance N/T below ankle to his toes)  Standing BP assessment: 02/28/24 -124/58mmHg 95bpm standing @ 0 -122/21mmHg 90bpm standing @ 1 110/7mmHg 92bpm standing @ 2  TREATMENT DATE 04/02/2024:   TA- To improve functional movements patterns for everyday tasks   Nustep level 4-7 rolling hills setting  for B UE and LE reciprocal movement training and for LE strength and endurance. X 9 min. Check ins throughout to ensure appropriate response.   Activity Description: steps with blaze pod taps Activity Setting:  random Number of Pods:  4 Cycles/Sets:  4 Duration (Time or Hit Count):  1.5 min  Airex fwd/retro step over hurdle 2 x 10 ea  LE   -VC to shift weight fwd when taking a step  Lateral step over hurdle on airex surface 2 x 10 ea LE   Step ups 2 x 10     NMR: To facilitate reeducation of movement, balance, posture, coordination, and/or proprioception/kinesthetic sense.  STS with airex under feet  - 2 x 12    TE- To improve strength, endurance, mobility, and function of specific targeted muscle groups or improve joint range of motion or improve muscle flexibility   Incline board heel raises with focus on maintaining balance at end of eccentric portion  3 x 15 reps UE support allowed  Unless otherwise stated, CGA was provided and gait belt donned in order to ensure pt safety  Pt required occasional rest breaks due fatigue, PT was attentive to when pt appeared to be tired or winded in order to prevent excessive fatigue, of note, pt less fatigued and required less rest compared to previous sessions.    PATIENT EDUCATION:  Education details: Pt progressing so far with cardio and balance activity.     Person educated: Patient Education method: Medical Illustrator Education comprehension: verbalized understanding, returned demonstration, and verbal cues required  HOME EXERCISE PROGRAM: Access Code: [email or web address] URL: [email or web address] Date: 03/12/2024 Prepared by: Lonni Gainer  Exercises - Marching Near Counter  - 1 x daily - 7 x weekly - 2 sets - 10 reps - 3 sec hold - Single leg balance near counter   - 1 x daily -  7 x weekly - 3 sets - 30 second hold - Heel Toe Raises with Counter Support  - 1 x daily - 7 x weekly - 3 sets - 10 reps  ASSESSMENT:  CLINICAL IMPRESSION:   Patient arrived with good motivation for completion of PT activities. Pt challenged with dynamic balance at this session. Pt was most challenged this session during sit to stands on airex and experienced mild LOB that pt was able to recover on own. Pt completed airex activities well and required no UE support  throughout. Sliding stepping stones was completed at end of session with pt noting legs becoming fatigued. The patient demonstrated significant progress while utilizing [employer], showcasing improved coordination, balance, and cognitive function. The incorporation of dual-tasking technology with color recognition and association with specific movements in Blaze Pods was strategically chosen to provide a dynamic training environment, enabling the patient to engage in simultaneous physical and cognitive tasks. This unique approach enhances not only their physical abilities but also fosters increased neural connectivity and mental awareness, contributing to a well-rounded and effective rehabilitation and training experience.  Pt will continue to benefit from skilled physical therapy intervention to address impairments, improve QOL, and attain therapy goals.   OBJECTIVE IMPAIRMENTS: decreased balance, decreased ROM, decreased strength, hypomobility, impaired flexibility, impaired UE functional use, and pain.   ACTIVITY LIMITATIONS: carrying, lifting, standing, bathing, dressing, reach over head, and hygiene/grooming PARTICIPATION LIMITATIONS: cleaning, laundry, shopping, community activity, and yard work PERSONAL FACTORS: Age and 3+ comorbidities: CAD, Depression, are also affecting patient's functional outcome.  REHAB POTENTIAL: Good CLINICAL DECISION MAKING: Stable/uncomplicated EVALUATION COMPLEXITY: Low   GOALS: Goals reviewed with patient? No  SHORT TERM GOALS: Target date: 01/03/2024  Patient will demonstrate undestanding of home exercise plan by performing exercises correctly with evidence of good carry over with min to no verbal or tactile cues .   Baseline: NT 12/25/23: Performing exercises independently  Goal status: ACHIEVED   2.  Patient will demonstrate understanding of how to modify exercises to decrease thoracic and cervical pain exacerbation for improved function and to decrease  incidence of pain flare ups.  Baseline: Does not currently have strategy to manage this  Goal status: NOT MET      LONG TERM GOALS: Target date: 03/13/2024  Patient will show a statistically significant improvement in his neck function as evidence by >=7.5 pt decrease in her neck disability index score to better be able to move neck to improve visual field while driving to avoid accidents. Vernel et al, 2009) Baseline: 33/50 (66%); 02/09/24: 66% 03/01/2024: 50% Goal status: NOT MET     2.  Patient will increase cervical mobility by  >=5 degrees AROM for all planes of motion (flexion, extension, side bending, and rotation) for improved cervical function and evidence of pain reduction. Baseline: Cervical AROM Flex 30, Ext 30, Rot R/L 40/40, SB R/L 35/35; 02/09/24: Rt rotation 50 degrees, Left rotation: 53 degress  03/01/2024: Flex:  30*   Ext: 20*      Rot L: 32*     Rot R: 30*      SB L: 20*       SB R: 15* Goal status: MET    3.  Patient will improve shoulder and periscapular strength by 1/3 grade MMT (4- to 4) for improved cervical stability and UE function to carry out upper extremity activities like washing his car and gardening without being limited by pain and discomfort.  Baseline: Shoulder Flex R/L 4/4, Shoulder Abd R/L  4/4; 10/31: shoulder flexion Rt: 5/5, left 4+/5; shoulder ABDCT 5/5 bilat  03/01/2024: Shld Flex R: 5 Shld Flex L: 4+ , Shld abd R: 4 +  Shld Abd L: 4 Goal status: MET    4.  Patient will reduce neck and thoracic pain to <=4/10 NRPS while performing UE activities like washing his car and doing yard work as evidence of improved cervical and thoracic function.  Baseline: 10/10 cervical paraspinal and rhomboid pain with UE activity; up to a 9/10 intermittently with activity   03/01/2024: 8/10 NPS UE activity Goal status: NOT MET    5. Patient (> 36 years old) will complete five times sit to stand test in < 15 seconds indicating an increased LE strength and improved  balance. Baseline: 17.28s Goal status: INITIAL  6. Patient will increase Berg Balance score by > 6 points to demonstrate decreased fall risk during functional activities Baseline: 47/56 Goal status: INITIAL  7. Patient will increase 10 meter walk test to >1.46m/s as to improve gait speed for better community ambulation and to reduce fall risk. Baseline: normal pace: 0.60m/s; fast pace: 0.975s Goal status: INITIAL  8. Patient will reduce timed up and go to <10 seconds to reduce fall risk and demonstrate improved transfer/gait ability. Baseline: 11.27s Goal status: INITIAL  9. Patient will decrease distance of by 134ft in order to increase cardiovascular endurance for improved community ambulation distance. Baseline: 940ft in 6 min w/ SPC Goal status: INITIAL  10. Pt will increase score on FGA by 4 points in order to increase functional ambulation and increase confidence in community environments. Baseline: 17/30 Goal status: INITIAL PLAN:  PT FREQUENCY: 1-2x/week PT DURATION: 8 weeks PLANNED INTERVENTIONS: [id number]- PT Re-evaluation, 97750- Physical Performance Testing, 97110-Therapeutic exercises, 97530- Therapeutic activity, [id number]- Neuromuscular re-education, 97535- Self Care, [id number]- Manual therapy, [phone]- Gait training, [phone]- Canalith repositioning, [id number]- Aquatic Therapy, [phone]- Electrical stimulation (unattended), [phone]- Electrical stimulation (manual), [id number]- Traction (mechanical), 20560 (1-2 muscles), 20561 (3+ muscles)- Dry Needling, Patient/Family education, Balance training, Joint mobilization, Joint manipulation, Spinal manipulation, Spinal mobilization, Vestibular training, DME instructions, Cryotherapy, and Moist heat  PLAN FOR NEXT SESSION:   -continue to address cervical ROM/pain -address global weakness, deconditioning, and imbalance.   Note: Portions of this document were prepared using Dragon voice recognition software and although reviewed may contain  unintentional dictation errors in syntax, grammar, or spelling.  Lonni KATHEE Gainer PT ,DPT Physical Therapist- [place]  [facility]   "

## 2024-04-02 NOTE — Therapy (Signed)
 " OUTPATIENT SPEECH LANGUAGE PATHOLOGY  SWALLOW TREATMENT     Patient Name: Shawn Lam MRN: [id number] DOB:Dec 08, 1950, 73 y.o., male Today's Date: 04/02/2024  PCP: Greig Ring, MD REFERRING PROVIDER: Greig Ring, MD    End of Session - 04/02/24 0929     Visit Number 19    Number of Visits 43    Date for Recertification  06/18/24    Authorization Type Medicare Part A    Progress Note Due on Visit 20    SLP Start Time 0930    SLP Stop Time  1000    SLP Time Calculation (min) 30 min    Activity Tolerance Patient tolerated treatment well          Past Medical History:  Diagnosis Date   Actinic keratosis 02/04/2021   right lateral neck   Anemia    Anxiety    Asymptomatic LV dysfunction    50-55% by echo 06/2016   BCC (basal cell carcinoma of skin) 08/05/2020   left neck infra auricular - nodular pattern - treated with [facility] 09/17/2020   Berger's disease    Blood transfusion without reported diagnosis 04/11/2018   CAD (coronary artery disease) 06/2005   PCI of LAD and RCA   Cancer (HCC)    Cataract 04/11/2016   04/11/2016   Depression    Dilated aortic root    4cm at sinus of Valsalva   Hematuria    Hyperlipidemia    Hypothyroidism    Insomnia    Left ureteral stone    Orthostatic hypotension    negative tilt table   OSA (obstructive sleep apnea)    moderate with AHI 17/hr, uses CPAP nightly(01/15/19 - has Inspire implant)   Osteoporosis 04/11/2016   PVC's (premature ventricular contractions) 07/07/2016   Renal disorder    PT IS SEEING DR. BETSEY- NEPHROLOGIST   Rosacea    Sleep apnea 04/11/2016   04/11/2016   Ulcer 04/11/2018   Urticaria    Past Surgical History:  Procedure Laterality Date   ANTERIOR CERVICAL DECOMP/DISCECTOMY FUSION N/A 03/13/2023   Procedure: CERVICAL FOUR-FIVE, CERVICAL FIVE-SIX ANTERIOR CERVICAL DISCECTOMY/DECOMPRESSION FUSION;  Surgeon: Onetha Kuba, MD;  Location: [facility] OR;  Service: Neurosurgery;  Laterality: N/A;  3C   CARDIAC  CATHETERIZATION  09/06/2013   [facility]   CARDIAC CATHETERIZATION     COLONOSCOPY WITH PROPOFOL  N/A 01/21/2019   Procedure: COLONOSCOPY WITH PROPOFOL ;  Surgeon: Jinny Carmine, MD;  Location: [facility];  Service: Endoscopy;  Laterality: N/A;  sleep apnea   COLONOSCOPY WITH PROPOFOL  N/A 10/19/2021   Procedure: COLONOSCOPY WITH PROPOFOL ;  Surgeon: Jinny Carmine, MD;  Location: [facility] ENDOSCOPY;  Service: Endoscopy;  Laterality: N/A;   CORONARY ANGIOPLASTY  2004   STENT PLACEMENT   CYSTOSCOPY WITH RETROGRADE PYELOGRAM, URETEROSCOPY AND STENT PLACEMENT Left 03/19/2014   Procedure: CYSTOSCOPY WITH RETROGRADE PYELOGRAM, URETEROSCOPY AND STENT PLACEMENT;  Surgeon: Mickey Ricardo KATHEE Alvaro, MD;  Location: [facility] ORS;  Service: Urology;  Laterality: Left;   CYSTOSCOPY WITH RETROGRADE PYELOGRAM, URETEROSCOPY AND STENT PLACEMENT Bilateral 05/20/2015   Procedure:  CYSTOSCOPY WITH BILATERAL RETROGRADE PYELOGRAM,RIGHT  DIAGNOSTIC URETEROSCOPY ,LEFT URETEROSCOPY WITH HOLMIUM LASER  AND BILATERAL  STENT PLACEMENT ;  Surgeon: Ricardo Alvaro, MD;  Location: [facility] ORS;  Service: Urology;  Laterality: Bilateral;   DRUG INDUCED ENDOSCOPY N/A 10/20/2017   Procedure: DRUG INDUCED SLEEP ENDOSCOPY;  Surgeon: Carlie Clark, MD;  Location: [facility];  Service: ENT;  Laterality: N/A;   EP IMPLANTABLE DEVICE N/A 05/13/2016   Procedure:  Loop Recorder Insertion;  Surgeon: Elspeth JAYSON Sage, MD;  Location: [facility] INVASIVE CV LAB;  Service: Cardiovascular;  Laterality: N/A;   ESOPHAGOGASTRODUODENOSCOPY N/A 10/19/2021   Procedure: ESOPHAGOGASTRODUODENOSCOPY (EGD);  Surgeon: Jinny Carmine, MD;  Location: [facility] ENDOSCOPY;  Service: Endoscopy;  Laterality: N/A;   ESOPHAGOGASTRODUODENOSCOPY (EGD) WITH PROPOFOL  N/A 11/30/2018   Procedure: ESOPHAGOGASTRODUODENOSCOPY (EGD) WITH PROPOFOL ;  Surgeon: Therisa Bi, MD;  Location[facility] END[facility]COPY;  Service: Gastroenterology;  Laterality: N/A;   ESOPHAGOGASTRODUODENOSCOPY (EGD) WITH PROPOFOL  N/A  12/01/2018   Procedure: ESOPHAGOGASTRODUODENOSCOPY (EGD) WITH PROPOFOL ;  Surgeon: Jinny Carmine, MD;  Location: [facility] ENDOSCOPY;  Service: Endoscopy;  Laterality: N/A;   HOLMIUM LASER APPLICATION Bilateral 05/20/2015   Procedure: HOLMIUM LASER APPLICATION;  Surgeon: Ricardo Likens, MD;  Location: [facility];  Service: Urology;  Laterality: Bilateral;   IMPLIMENTATION OF A HYPOGLOSSAL NERVE STIMULATOR  12/08/2017   OSA    IR GASTROSTOMY TUBE MOD SED  01/18/2024   IR RADIOLOGIST EVAL & MGMT  02/13/2024   LEFT HEART CATHETERIZATION WITH CORONARY ANGIOGRAM N/A 09/06/2013   Procedure: LEFT HEART CATHETERIZATION WITH CORONARY ANGIOGRAM;  Surgeon: Victory LELON Claudene DOUGLAS, MD;  Location: [facility] CATH LAB;  Service: Cardiovascular;  Laterality: N/A;   LEFT KNEE CAP  SURGERY ABOUT 40 YRS AGO  ? 1970     SPINE SURGERY  Desk in neck   STONE EXTRACTION WITH BASKET Left 03/19/2014   Procedure: STONE EXTRACTION WITH BASKET;  Surgeon: Mickey Ricardo KATHEE Likens, MD;  Location: [facility];  Service: Urology;  Laterality: Left;   TRANSURETHRAL RESECTION OF PROSTATE N/A 08/25/2021   Procedure: TRANSURETHRAL RESECTION OF THE PROSTATE (TURP);  Surgeon: Likens Ricardo, MD;  Location: [facility];  Service: Urology;  Laterality: N/A;  1 HR   Patient Active Problem List   Diagnosis Date Noted   Abnormal PET scan of lung 03/29/2024   S/P gastrostomy tube (G tube) placement, follow-up exam 02/18/2024   Granulation tissue of site of gastrostomy 02/06/2024   Protein-calorie malnutrition, severe 01/23/2024   Multifocal pneumonia 01/22/2024   Fall 01/03/2024   Hematoma of right hip 01/03/2024   Chronic pulmonary aspiration 12/29/2023   Silent aspiration 12/29/2023   Moderate protein-calorie malnutrition (weight for age 77-74% of standard) 12/29/2023   Bilateral leg weakness 12/29/2023   Chronic respiratory failure with hypoxia, on home oxygen  therapy (HCC) 12/29/2023   Chronic constipation 12/29/2023   Somnolence 12/29/2023   Frequent falls  12/29/2023   Feeding difficulties, unspecified 12/20/2023   Shortness of breath 12/20/2023   Abnormal CT of the chest 10/10/2023   Elevated blood sugar 06/30/2023   Carotid stenosis 05/11/2023   Spinal stenosis in cervical region 03/13/2023   Osteoarthritis of spine with radiculopathy, cervical region 07/14/2022   Cervicogenic headache 11/30/2021   Chronic neck pain 11/30/2021   Weight loss, non-intentional    Dysphagia    Polyp of colon    Prostatic hyperplasia 08/25/2021   Chronic insomnia 03/19/2020   Peripheral neuropathy 03/19/2020   Iron deficiency anemia 01/06/2020   Allergic dermatitis 07/08/2019   Osteoarthritis of left knee 02/26/2019   History of colonic polyps    History of duodenal ulcer    CKD (chronic kidney disease), stage IIIa    Bilateral chronic knee pain 11/09/2018   Epistaxis 10/02/2018   Subjective tinnitus, bilateral 10/02/2018   Chronic pain of right thumb 02/22/2018   Acute bilateral thoracic back pain 01/23/2018   Osteoporosis without current pathological fracture 02/10/2017   Hypothyroidism 11/02/2016   Thoracic compression fracture (HCC) 09/06/2016   PVC's (  premature ventricular contractions) 07/07/2016   Hypoalbuminemia 06/28/2016   IgA nephropathy 06/07/2016   Acute buttock pain 05/23/2016   MDD (major depressive disorder), recurrent episode, moderate (HCC) 11/20/2015   OSA (obstructive sleep apnea) 02/05/2014   Orthostatic hypotension 02/07/2013   Scotoma involving central area 01/29/2013   Optic neuropathy 01/28/2013   HEMATURIA UNSPECIFIED 01/21/2010   HLD (hyperlipidemia) 07/05/2006   Generalized anxiety disorder 07/05/2006   CAD (coronary artery disease) 07/05/2006   GERD 07/05/2006    ONSET DATE: 03/2023 following ACDF; Date of referral 12/28/2023: date of this referral 02/01/2024  REFERRING DIAG:  T17.908D (ICD-10-CM) - Chronic pulmonary aspiration, subsequent encounter  T17.900D (ICD-10-CM) - Silent aspiration, subsequent  encounter  E44.0 (ICD-10-CM) - Moderate protein-calorie malnutrition  R13.10 (ICD-10-CM) - Dysphagia, unspecified type    THERAPY DIAG:  Dysphagia, oropharyngeal phase  Rationale for Evaluation and Treatment Rehabilitation  SUBJECTIVE:   PERTINENT HISTORY and DIAGNOSTIC FINDINGS:  Pt is a 73 yo male w/ ACDF surgery 03/2023.  PMH: GERD(Not on a PPI per chart), Actinic keratosis (02/04/2021), Anemia, Anxiety, Asymptomatic LV dysfunction, BCC (basal cell carcinoma of skin) (08/05/2020), Berger's disease, CAD (coronary artery disease) (06/2005), resting tremor, Depression, Dilated aortic root (HCC), Hematuria, Hyperlipidemia, Hypothyroidism, Insomnia, Left ureteral stone, Orthostatic hypotension, OSA (obstructive sleep apnea), PVC's (premature ventricular contractions) (07/07/2016), Renal disorder, Rosacea, and Urticaria.  Pt is followed by Cardiology for Coronary Artery Stenosis currently; also followed for Migraines.     EGD in 2023 revealed Gastritis.      CT of Chest 10/2023: 1. Interim development of patchy consolidation and ground-glass   disease most evident at the right middle lobe and right lower lobes   with additional areas of mild ground-glass disease and scattered   clustered nodularity, findings are suspicious for multifocal   infection/pneumonia. Imaging follow-up to resolution is recommended.   2. Emphysema.   3. Aortic atherosclerosis.            Dysphagia - reported by pt as Chronic, ever since neck surgery in December 2024. Pt was seen in the emergency room s/p Fall, notes detailing that he slipped on soap in the shower, fell backwards on right side hit head and neck against shower seat, possible brief loss of consciousness, per chart.  ACDF 03/2023 per chart.  Since the neck surgery in 03/2023, he has noted food, liquids and pill getting stuck on the right side of my throat more.  I used to cough a lot more but not as much now.  I cough up what gets stuck..  He has lost  significant weight per chart notes due to decreased eating.  Pt endorsed using his home O2 when he feels SOB.   MBSS 12/26/2023 Patient appears to present w/ Chronic, moderate-severe Pharyngeal phase dysphagia per this assessment today. It appeared there was some impact from changes in the Cervical Esophagus s/p ACDF surgery(03/2023 per chart notes) including structural/tissue changes in/aournd the CP segment and cervical esophagus. Pt with intermittent silent aspiration of thin liquids.   Recommend continue current diet of easy to eat foods at home; thin liquids via cup- at this time. Oral care Before any oral intake. Swallow Strategies Recommended: No straws, moistened foods, SMALL bites/sips, Head Turn RIGHT w/ slight chin tuck, multiple swallows to aid clearing, AND strong Cough/throat clear b/t bites/sips and at end of meal.  D/t the Chronicity of pt's Pharyngeal phase Dysphagia w/ weight loss and pulmonary impact per recent Chest CT, pt and Family may need to discuss alternative means of  nutrition/hydration as support w/ his Medical Team/PCP, while seeking swallow therapy.  Received ENT report dated 01/12/2024 Laryngoscopy revealed  Moderate vocal fold bowing bilaterally and moderate glottic gamp with phonation Impression/plan: Aspiration on MBSS and significant weight loss since ACDF last year. TVF shows moderate sized glottic gap on laryngoscoy but mobile vocal folds bilaterally. Pooling in pyriform on MBSS but no significant pooling of secretions on laryngoscopy. I feel that between speech therpay and possibly bilateral injection laryngoplasty, hopefully his swallowing will significantly improve. Will make referral to Dr Brien at [facility].   Pt and his wife report appt with Dr Brien in January 2026.  10/10/205 PEG placed  01/22/2024 thru 01/24/2024 Hospitalized d/t fall and confusion CT of the chest on arrival showed multifocal pneumonia and possible free air. When reviewed further  with interventional radiology team it was determined this was an appropriate amount of free air secondary to recent PEG tube placement. Patient was initiated on antibiotic therapy for pneumonia and improved significantly.   02/07/2024 CT Chest 1. Evolving multifocal inflammatory infiltrates, progressive within the right upper lobe and improved within the lung bases bilaterally. Recommend appropriate clinical management and imaging follow-up per clinical course. 2. Mild emphysema. Given emphysema as an independent lung cancer risk factor, consider evaluation for a low-dose CT lung cancer screening program if the patient is 73 years old and otherwise eligible.   PAIN:  Are you having pain? No  FALLS: Has patient fallen in last 6 months?  See PT evaluation for details  LIVING ENVIRONMENT: Lives with: lives with their spouse Lives in: House/apartment  PLOF:  Level of assistance: Independent with ADLs, Independent with IADLs Employment: Retired   PATIENT GOALS  to improve swallow function, nutrition and hydration, decrease aspiration risks  SUBJECTIVE STATEMENT: Pt pleasant,  Pt accompanied by: self   OBJECTIVE:   TODAY'S TREATMENT:  Skilled treatment session focused on pt's dysphagia goals. SLP facilitated the session by providing the following interventions:       Pt states that his appt with PCP went well and his new formula should arrive today that will target weight gain.    Respiratory Muscle Strengthening: Pt independently able to maneuver devices correctly. EMST 75 set to 55 cm H2O: Pt able to complete three sets of 8 reps independently.  IMST set to 55 cm H2O: Pt able to complete 3 sets of 6 reps independently.  Pharyngeal exercises: Masako: Pt able to complete 3 sets of 8 reps independently.  Head Extension Swallowing Exercise - Pt able to complete 3 sets of 8 repetitions independently.  CTAR: Pt able to independently complete 3 sets of 8 reps.   PO  consumption: Skilled observation was provided of pt consuming thin liquids via cup. Pt with constant throat clear during consumption and throughout pharyngeal strengthening activities. Hopeful this is a sign of increased pharyngeal sensitivity.    PATIENT EDUCATION: Education details: as above Person educated: Patient and Spouse Education method: Chief Technology Officer Education comprehension: needs further education  HOME EXERCISE PROGRAM:     See above   GOALS: Goals reviewed with patient? Yes  SHORT TERM GOALS: Target date: 10 sessions  Updated 02/28/2024  Updated 02/09/2024 With Mod I, pt will perform compensatory swallow strategies (chin tuck, multiple swallows, head turn) to eliminate s/s of aspiration when consuming least restrictive diet. Baseline: Goal status: INITIAL: moderate A: Progress made, fluctuating between supervision and independent cues  2.  With Min A, pt will complete (supraglottic, swallow, Mednelson maneuver, effortful swallow) to  improve oral motor weakness, tongue base retraction, hyolarnygeal excursion, airway protection and/or clearance of the bolus through the pharynx. Baseline:  Goal status: INITIAL: progress made; moderate A: Progress made, fluctuating between supervision and independent cues  3. Pt will demonstrate overt s/s of aspiration when consuming ice chips.   Baseline: Goal status: INITIAL: no overt s/s of aspiration observed during today's session: Intermittent overt s/s observed   LONG TERM GOALS: Target date: 03/25/2024  Updated 02/28/2024  Updated: 02/09/2024 With Mod I, pt will demonstrate the ability to adequately self-monitor swallowing skills and perform appropriate compensatory techniques to reduce s/s of aspiration and to safely consume least restrictive diet.     Baseline:  Goal status: INITIAL: continues to be appropriate goal  2.  Pt will participate in repeat instrumental swallow evaluation to assess least restrictive diet.   Baseline:  Goal status: INITIAL: continues to be appropriate goal: Modified Barium scheduled for 11/21   ASSESSMENT:  CLINICAL IMPRESSION: Patient is a 73 y.o. male who was seen today for a clinical swallow re-evaluation d/t severe pharyngeal phase dysphagia following ACDF (03/2023).    See the above treatment note for details.   OBJECTIVE IMPAIRMENTS include dysphagia. These impairments are limiting patient from safety when swallowing. Factors affecting potential to achieve goals and functional outcome are severity of impairments and malnutrition. Patient will benefit from skilled SLP services to address above impairments and improve overall function.  REHAB POTENTIAL: Good  PLAN: SLP FREQUENCY: 1-2x/week  SLP DURATION: 12 weeks  PLANNED INTERVENTIONS: Aspiration precaution training, Pharyngeal strengthening exercises, Diet toleration management , Oral motor exercises, SLP instruction and feedback, Compensatory strategies, and Patient/family education   Inara Dike B. Rubbie, M.S., CCC-SLP, CBIS Speech-Language Pathologist Certified Brain Injury Specialist [facility]  [facility] [phone] Ascom [phone] Fax [phone]               "

## 2024-04-03 ENCOUNTER — Other Ambulatory Visit: Payer: Self-pay | Admitting: Family Medicine

## 2024-04-03 ENCOUNTER — Encounter: Payer: Self-pay | Admitting: Family Medicine

## 2024-04-09 ENCOUNTER — Ambulatory Visit

## 2024-04-09 ENCOUNTER — Ambulatory Visit: Payer: Self-pay | Admitting: Family Medicine

## 2024-04-09 ENCOUNTER — Encounter: Admitting: Speech Pathology

## 2024-04-09 ENCOUNTER — Ambulatory Visit: Admitting: Physical Therapy

## 2024-04-09 ENCOUNTER — Ambulatory Visit: Admitting: Speech Pathology

## 2024-04-09 ENCOUNTER — Other Ambulatory Visit (INDEPENDENT_AMBULATORY_CARE_PROVIDER_SITE_OTHER): Payer: Medicare Other

## 2024-04-09 DIAGNOSIS — M6281 Muscle weakness (generalized): Secondary | ICD-10-CM

## 2024-04-09 DIAGNOSIS — R490 Dysphonia: Secondary | ICD-10-CM

## 2024-04-09 DIAGNOSIS — R1312 Dysphagia, oropharyngeal phase: Secondary | ICD-10-CM

## 2024-04-09 DIAGNOSIS — Z931 Gastrostomy status: Secondary | ICD-10-CM

## 2024-04-09 DIAGNOSIS — R2689 Other abnormalities of gait and mobility: Secondary | ICD-10-CM

## 2024-04-09 DIAGNOSIS — R269 Unspecified abnormalities of gait and mobility: Secondary | ICD-10-CM

## 2024-04-09 LAB — COMPREHENSIVE METABOLIC PANEL WITH GFR
ALT: 36 U/L (ref 3–53)
AST: 43 U/L — ABNORMAL HIGH (ref 5–37)
Albumin: 3.8 g/dL (ref 3.5–5.2)
Alkaline Phosphatase: 67 U/L (ref 39–117)
BUN: 32 mg/dL — ABNORMAL HIGH (ref 6–23)
CO2: 29 meq/L (ref 19–32)
Calcium: 9.6 mg/dL (ref 8.4–10.5)
Chloride: 100 meq/L (ref 96–112)
Creatinine, Ser: 1.69 mg/dL — ABNORMAL HIGH (ref 0.40–1.50)
GFR: 39.7 mL/min — ABNORMAL LOW
Glucose, Bld: 135 mg/dL — ABNORMAL HIGH (ref 70–99)
Potassium: 4.1 meq/L (ref 3.5–5.1)
Sodium: 137 meq/L (ref 135–145)
Total Bilirubin: 0.6 mg/dL (ref 0.2–1.2)
Total Protein: 6.4 g/dL (ref 6.0–8.3)

## 2024-04-09 LAB — MAGNESIUM: Magnesium: 2 mg/dL (ref 1.5–2.5)

## 2024-04-09 LAB — PHOSPHORUS: Phosphorus: 3.8 mg/dL (ref 2.3–4.6)

## 2024-04-09 NOTE — Therapy (Signed)
 " OUTPATIENT PHYSICAL THERAPY TREATMENT   Patient Name: Shawn Lam MRN: [id number] DOB:[birth year], 73 y.o., male Today's Date: 04/09/2024  END OF SESSION:  PT End of Session - 04/09/24 0944     Visit Number 19    Number of Visits 27    Date for Recertification  04/30/24    Progress Note Due on Visit 20    PT Start Time 0847    PT Stop Time 0915    PT Time Calculation (min) 28 min    Equipment Utilized During Treatment Gait belt    Activity Tolerance Patient tolerated treatment well    Behavior During Therapy [facility] for tasks assessed/performed            Past Medical History:  Diagnosis Date   Actinic keratosis 02/04/2021   right lateral neck   Anemia    Anxiety    Asymptomatic LV dysfunction    50-55% by echo 06/2016   BCC (basal cell carcinoma of skin) 08/05/2020   left neck infra auricular - nodular pattern - treated with [facility] 09/17/2020   Berger's disease    Blood transfusion without reported diagnosis 04/11/2018   CAD (coronary artery disease) 06/2005   PCI of LAD and RCA   Cancer (HCC)    Cataract 04/11/2016   04/11/2016   Depression    Dilated aortic root    4cm at sinus of Valsalva   Hematuria    Hyperlipidemia    Hypothyroidism    Insomnia    Left ureteral stone    Orthostatic hypotension    negative tilt table   OSA (obstructive sleep apnea)    moderate with AHI 17/hr, uses CPAP nightly(01/15/19 - has Inspire implant)   Osteoporosis 04/11/2016   PVC's (premature ventricular contractions) 07/07/2016   Renal disorder    PT IS SEEING DR. BETSEY- NEPHROLOGIST   Rosacea    Sleep apnea 04/11/2016   04/11/2016   Ulcer 04/11/2018   Urticaria    Past Surgical History:  Procedure Laterality Date   ANTERIOR CERVICAL DECOMP/DISCECTOMY FUSION N/A 03/13/2023   Procedure: CERVICAL FOUR-FIVE, CERVICAL FIVE-SIX ANTERIOR CERVICAL DISCECTOMY/DECOMPRESSION FUSION;  Surgeon: Onetha Kuba, MD;  Location: [facility] OR;  Service: Neurosurgery;  Laterality: N/A;  3C    CARDIAC CATHETERIZATION  09/06/2013   [facility]   CARDIAC CATHETERIZATION     COLONOSCOPY WITH PROPOFOL  N/A 01/21/2019   Procedure: COLONOSCOPY WITH PROPOFOL ;  Surgeon: Jinny Carmine, MD;  Location: [facility];  Service: Endoscopy;  Laterality: N/A;  sleep apnea   COLONOSCOPY WITH PROPOFOL  N/A 10/19/2021   Procedure: COLONOSCOPY WITH PROPOFOL ;  Surgeon: Jinny Carmine, MD;  Location: [facility] ENDOSCOPY;  Service: Endoscopy;  Laterality: N/A;   CORONARY ANGIOPLASTY  2004   STENT PLACEMENT   CYSTOSCOPY WITH RETROGRADE PYELOGRAM, URETEROSCOPY AND STENT PLACEMENT Left 03/19/2014   Procedure: CYSTOSCOPY WITH RETROGRADE PYELOGRAM, URETEROSCOPY AND STENT PLACEMENT;  Surgeon: Mickey Ricardo KATHEE Alvaro, MD;  Location: [facility] ORS;  Service: Urology;  Laterality: Left;   CYSTOSCOPY WITH RETROGRADE PYELOGRAM, URETEROSCOPY AND STENT PLACEMENT Bilateral 05/20/2015   Procedure:  CYSTOSCOPY WITH BILATERAL RETROGRADE PYELOGRAM,RIGHT  DIAGNOSTIC URETEROSCOPY ,LEFT URETEROSCOPY WITH HOLMIUM LASER  AND BILATERAL  STENT PLACEMENT ;  Surgeon: Ricardo Alvaro, MD;  Location: [facility] ORS;  Service: Urology;  Laterality: Bilateral;   DRUG INDUCED ENDOSCOPY N/A 10/20/2017   Procedure: DRUG INDUCED SLEEP ENDOSCOPY;  Surgeon: Carlie Clark, MD;  Location: [facility];  Service: ENT;  Laterality: N/A;   EP IMPLANTABLE DEVICE N/A 05/13/2016   Procedure:  Loop Recorder Insertion;  Surgeon: Elspeth JAYSON Sage, MD;  Location: [facility] INVASIVE CV LAB;  Service: Cardiovascular;  Laterality: N/A;   ESOPHAGOGASTRODUODENOSCOPY N/A 10/19/2021   Procedure: ESOPHAGOGASTRODUODENOSCOPY (EGD);  Surgeon: Jinny Carmine, MD;  Location: [facility] ENDOSCOPY;  Service: Endoscopy;  Laterality: N/A;   ESOPHAGOGASTRODUODENOSCOPY (EGD) WITH PROPOFOL  N/A 11/30/2018   Procedure: ESOPHAGOGASTRODUODENOSCOPY (EGD) WITH PROPOFOL ;  Surgeon: Therisa Bi, MD;  Location: [facility] ENDOSCOPY;  Service: Gastroenterology;  Laterality: N/A;   ESOPHAGOGASTRODUODENOSCOPY (EGD) WITH  PROPOFOL  N/A 12/01/2018   Procedure: ESOPHAGOGASTRODUODENOSCOPY (EGD) WITH PROPOFOL ;  Surgeon: Jinny Carmine, MD;  Location: [facility] ENDOSCOPY;  Service: Endoscopy;  Laterality: N/A;   HOLMIUM LASER APPLICATION Bilateral 05/20/2015   Procedure: HOLMIUM LASER APPLICATION;  Surgeon: Ricardo Likens, MD;  Location: [facility] ORS;  Service: Urology;  Laterality: Bilateral;   IMPLIMENTATION OF A HYPOGLOSSAL NERVE STIMULATOR  12/08/2017   OSA    IR GASTROSTOMY TUBE MOD SED  01/18/2024   IR RADIOLOGIST EVAL & MGMT  02/13/2024   LEFT HEART CATHETERIZATION WITH CORONARY ANGIOGRAM N/A 09/06/2013   Procedure: LEFT HEART CATHETERIZATION WITH CORONARY ANGIOGRAM;  Surgeon: Victory LELON Claudene DOUGLAS, MD;  Location: [facility] CATH LAB;  Service: Cardiovascular;  Laterality: N/A;   LEFT KNEE CAP  SURGERY ABOUT 40 YRS AGO  ? 1970     SPINE SURGERY  Desk in neck   STONE EXTRACTION WITH BASKET Left 03/19/2014   Procedure: STONE EXTRACTION WITH BASKET;  Surgeon: Mickey Ricardo KATHEE Likens, MD;  Location: [facility] ORS;  Service: Urology;  Laterality: Left;   TRANSURETHRAL RESECTION OF PROSTATE N/A 08/25/2021   Procedure: TRANSURETHRAL RESECTION OF THE PROSTATE (TURP);  Surgeon: Likens Ricardo, MD;  Location: [facility] ORS;  Service: Urology;  Laterality: N/A;  1 HR   Patient Active Problem List   Diagnosis Date Noted   Abnormal PET scan of lung 03/29/2024   S/P gastrostomy tube (G tube) placement, follow-up exam 02/18/2024   Granulation tissue of site of gastrostomy 02/06/2024   Protein-calorie malnutrition, severe 01/23/2024   Multifocal pneumonia 01/22/2024   Fall 01/03/2024   Hematoma of right hip 01/03/2024   Chronic pulmonary aspiration 12/29/2023   Silent aspiration 12/29/2023   Moderate protein-calorie malnutrition (weight for age 62-74% of standard) 12/29/2023   Bilateral leg weakness 12/29/2023   Chronic respiratory failure with hypoxia, on home oxygen  therapy (HCC) 12/29/2023   Chronic constipation 12/29/2023   Somnolence 12/29/2023    Frequent falls 12/29/2023   Feeding difficulties, unspecified 12/20/2023   Shortness of breath 12/20/2023   Abnormal CT of the chest 10/10/2023   Elevated blood sugar 06/30/2023   Carotid stenosis 05/11/2023   Spinal stenosis in cervical region 03/13/2023   Osteoarthritis of spine with radiculopathy, cervical region 07/14/2022   Cervicogenic headache 11/30/2021   Chronic neck pain 11/30/2021   Weight loss, non-intentional    Dysphagia    Polyp of colon    Prostatic hyperplasia 08/25/2021   Chronic insomnia 03/19/2020   Peripheral neuropathy 03/19/2020   Iron deficiency anemia 01/06/2020   Allergic dermatitis 07/08/2019   Osteoarthritis of left knee 02/26/2019   History of colonic polyps    History of duodenal ulcer    CKD (chronic kidney disease), stage IIIa    Bilateral chronic knee pain 11/09/2018   Epistaxis 10/02/2018   Subjective tinnitus, bilateral 10/02/2018   Chronic pain of right thumb 02/22/2018   Acute bilateral thoracic back pain 01/23/2018   Osteoporosis without current pathological fracture 02/10/2017   Hypothyroidism 11/02/2016   Thoracic compression fracture (HCC) 09/06/2016   PVC's (  premature ventricular contractions) 07/07/2016   Hypoalbuminemia 06/28/2016   IgA nephropathy 06/07/2016   Acute buttock pain 05/23/2016   MDD (major depressive disorder), recurrent episode, moderate (HCC) 11/20/2015   OSA (obstructive sleep apnea) 02/05/2014   Orthostatic hypotension 02/07/2013   Scotoma involving central area 01/29/2013   Optic neuropathy 01/28/2013   HEMATURIA UNSPECIFIED 01/21/2010   HLD (hyperlipidemia) 07/05/2006   Generalized anxiety disorder 07/05/2006   CAD (coronary artery disease) 07/05/2006   GERD 07/05/2006    PCP: Dr. Greig Ring    REFERRING PROVIDER: Dr. Arley Helling   REFERRING DIAG: M54.6 (ICD-10-CM) - Acute bilateral thoracic back pain  THERAPY DIAG:  Abnormality of gait  Muscle weakness (generalized)  Balance disorder  Rationale for  Evaluation and Treatment: Rehabilitation  ONSET DATE: May 2025    SUBJECTIVE:                                                                                                                                                                                                         SUBJECTIVE STATEMENT  Initially patient reports doing well but as we were walking into clinic patient had a syncopal episode where he lost his balance had to be stabilized by physical therapist and eventually brought back into clinic and transport chair as described in objective.  No falls or significant changes to notes since last visit upon getting subjective while seated later in session  PERTINENT HISTORY:   73yoM referred to physical therapy for ongoing neck pain following cervical fusion in May 2020 and newly developed thoracic pain from T12 compression fracture that occurred sometime in Spring of 2025 (associated radicular pain pattern). Pt has pain localized to upper traps and suboccipitals. Pt attempted DC of prednisone  and gabapentin  two weeks prior to evaluation here, severe decline in function due to pain, barely able to walk for two weeks. He describes feeling increased fatigue and weakness to the point where he struggled to walk. Pt also reported repeated falls in the setting of orthostatic hypotension with him experiencing syncope when he stands up from a seated position. He describes feeling a burning sensation that is also localized to his scapulae and that it occurs when he is doing UE activities like washing his car that is likely the radicular pain from thoracic vertebrae compression fracture.  PAIN: Are you having pain? 03/05/2024: 6/10 today in the neck  PRECAUTIONS: None Pt has thoracic compression fractures and is s/p cervical spine surgery   WEIGHT BEARING RESTRICTIONS: No  FALLS:   Has patient fallen in last 6 months? Yes. Number  of falls 6, pt reports that falls are happening in the context of  syncope when standing up from a seated position (Orthostatic Hypotension) , where he blacks out and falls. He is following up with his PCP, Dr. Karlene who is working with him on stabilizing his blood pressure and she has recently taken him off Gabapentin  to see if this helps. Pt reports that he does note an improvement in his symptoms with less light headedness since going off medication.   LIVING ENVIRONMENT: Lives with: lives with their spouse Lives in: House/apartment Stairs: No Has following equipment at home: Single point cane and Environmental Consultant - 4 wheeled  OCCUPATION: Retired     PLOF: Independent  PATIENT GOALS: Improve balance, decrease falls.       OBJECTIVE:   Note: Objective measures were completed at evaluation unless otherwise noted. Reassessment Measures 02/09/24: -eyes closed balance, normal stance: <5sec with retropulsion each time, ModA falls recovery  -narrow stance eyes open balance: <10sec with LOB, ModA falls recovery  -Long distance AMB assessment c SPC: 137ft with 2 DOE standing breaks ~15 seconds each, 15sec; crossover gait intermittently without LOB; -standing vitals taken at end of walk: stable and comparable to prior seated vitals.  -cervical rotation ROM: 50deg Rt, 53 degree left  -shoulder flexion MMT: 5/5 Rt, 4+/5 left; shoulder ABDCT MMT: 5/5 bilat  -ankle DF assessment: can perform >10x full range while leaning against wall (adequate for gait needs)  -sensory testing about ankles, lower legs: (feels intact, but pt reports constance N/T below ankle to his toes)  Standing BP assessment: 02/28/24 -124/57mmHg 95bpm standing @ 0 -122/48mmHg 90bpm standing @ 1 110/16mmHg 92bpm standing @ 2  TREATMENT DATE 04/09/2024:   Self Care:  89/55 BP upon start - PT had to prevent fall in the hallway en route to clinic. Transport chair grabbed and used to transport pt remainder of way to check BP.  Had pt drink part of medium cup of water  and re checked 5 min post   - BP: 86/56 Had pt finish cup of water  and checked 10 min post last measurement, educated on fluid intake and electrolytes.  - 90/65 Finish session leaving patient in waiting room for speech and language pathologist and provided electrolyte drink for him to drink prior to SLP appointment.  Informed SLP via quick chat of current condition and instructed for further blood pressure checks prior to understand with patient this date.   Unless otherwise stated, CGA was provided and gait belt donned in order to ensure pt safety  Pt required occasional rest breaks due fatigue, PT was attentive to when pt appeared to be tired or winded in order to prevent excessive fatigue, of note, pt less fatigued and required less rest compared to previous sessions.    PATIENT EDUCATION:  Education details: Pt progressing so far with cardio and balance activity.     Person educated: Patient Education method: Medical Illustrator Education comprehension: verbalized understanding, returned demonstration, and verbal cues required  HOME EXERCISE PROGRAM: Access Code: [email or web address] URL: [email or web address] Date: 03/12/2024 Prepared by: Lonni Gainer  Exercises - Marching Near Counter  - 1 x daily - 7 x weekly - 2 sets - 10 reps - 3 sec hold - Single leg balance near counter   - 1 x daily - 7 x weekly - 3 sets - 30 second hold - Heel Toe Raises with Counter Support  - 1 x daily - 7 x weekly - 3 sets - 10 reps  ASSESSMENT:  CLINICAL IMPRESSION:   During entrance to into clinic patient had syncopal episode requiring max assist and physical therapist to prevent fall.  Patient demonstrates significant low blood pressure likely leading to the syncopal episode that happened upon walking in.  Patient demonstrates significant low blood pressure as evidenced and described in treatment session of exam.  Patient provided with education regarding importance of maintaining blood pressure as well as  with instructions for fluid intake.  With intake of medium size cup of water  patient did demonstrate improvement in blood pressure physical therapist instructed speech and language pathologist who is seeing him next to continue to monitor blood pressure prior to patient leaving clinic today.Pt will continue to benefit from skilled physical therapy intervention to address impairments, improve QOL, and attain therapy goals.    OBJECTIVE IMPAIRMENTS: decreased balance, decreased ROM, decreased strength, hypomobility, impaired flexibility, impaired UE functional use, and pain.   ACTIVITY LIMITATIONS: carrying, lifting, standing, bathing, dressing, reach over head, and hygiene/grooming PARTICIPATION LIMITATIONS: cleaning, laundry, shopping, community activity, and yard work PERSONAL FACTORS: Age and 3+ comorbidities: CAD, Depression, are also affecting patient's functional outcome.  REHAB POTENTIAL: Good CLINICAL DECISION MAKING: Stable/uncomplicated EVALUATION COMPLEXITY: Low   GOALS: Goals reviewed with patient? No  SHORT TERM GOALS: Target date: 01/03/2024  Patient will demonstrate undestanding of home exercise plan by performing exercises correctly with evidence of good carry over with min to no verbal or tactile cues .   Baseline: NT 12/25/23: Performing exercises independently  Goal status: ACHIEVED   2.  Patient will demonstrate understanding of how to modify exercises to decrease thoracic and cervical pain exacerbation for improved function and to decrease incidence of pain flare ups.  Baseline: Does not currently have strategy to manage this  Goal status: NOT MET      LONG TERM GOALS: Target date: 03/13/2024  Patient will show a statistically significant improvement in his neck function as evidence by >=7.5 pt decrease in her neck disability index score to better be able to move neck to improve visual field while driving to avoid accidents. Vernel et al, 2009) Baseline: 33/50 (66%);  02/09/24: 66% 03/01/2024: 50% Goal status: NOT MET     2.  Patient will increase cervical mobility by  >=5 degrees AROM for all planes of motion (flexion, extension, side bending, and rotation) for improved cervical function and evidence of pain reduction. Baseline: Cervical AROM Flex 30, Ext 30, Rot R/L 40/40, SB R/L 35/35; 02/09/24: Rt rotation 50 degrees, Left rotation: 53 degress  03/01/2024: Flex:  30*   Ext: 20*      Rot L: 32*     Rot R: 30*      SB L: 20*       SB R: 15* Goal status: MET    3.  Patient will improve shoulder and periscapular strength by 1/3 grade MMT (4- to 4) for improved cervical stability and UE function to carry out upper extremity activities like washing his car and gardening without being limited by pain and discomfort.  Baseline: Shoulder Flex R/L 4/4, Shoulder Abd R/L 4/4; 10/31: shoulder flexion Rt: 5/5, left 4+/5; shoulder ABDCT 5/5 bilat  03/01/2024: Shld Flex R: 5 Shld Flex L: 4+ , Shld abd R: 4 +  Shld Abd L: 4 Goal status: MET    4.  Patient will reduce neck and thoracic pain to <=4/10 NRPS while performing UE activities like washing his car and doing yard work as evidence of improved cervical and thoracic function.  Baseline: 10/10 cervical paraspinal and rhomboid pain with UE activity; up to a 9/10 intermittently with activity   03/01/2024: 8/10 NPS UE activity Goal status: NOT MET    5. Patient (> 41 years old) will complete five times sit to stand test in < 15 seconds indicating an increased LE strength and improved balance. Baseline: 17.28s Goal status: INITIAL  6. Patient will increase Berg Balance score by > 6 points to demonstrate decreased fall risk during functional activities Baseline: 47/56 Goal status: INITIAL  7. Patient will increase 10 meter walk test to >1.82m/s as to improve gait speed for better community ambulation and to reduce fall risk. Baseline: normal pace: 0.41m/s; fast pace: 0.975s Goal status: INITIAL  8. Patient will  reduce timed up and go to <10 seconds to reduce fall risk and demonstrate improved transfer/gait ability. Baseline: 11.27s Goal status: INITIAL  9. Patient will decrease distance of by 155ft in order to increase cardiovascular endurance for improved community ambulation distance. Baseline: 941ft in 6 min w/ SPC Goal status: INITIAL  10. Pt will increase score on FGA by 4 points in order to increase functional ambulation and increase confidence in community environments. Baseline: 17/30 Goal status: INITIAL PLAN:  PT FREQUENCY: 1-2x/week PT DURATION: 8 weeks PLANNED INTERVENTIONS: [id number]- PT Re-evaluation, 97750- Physical Performance Testing, 97110-Therapeutic exercises, 97530- Therapeutic activity, [id number]- Neuromuscular re-education, 97535- Self Care, [id number]- Manual therapy, [phone]- Gait training, [phone]- Canalith repositioning, [id number]- Aquatic Therapy, [phone]- Electrical stimulation (unattended), [phone]- Electrical stimulation (manual), [id number]- Traction (mechanical), 20560 (1-2 muscles), [id number] (3+ muscles)- Dry Needling, Patient/Family education, Balance training, Joint mobilization, Joint manipulation, Spinal manipulation, Spinal mobilization, Vestibular training, DME instructions, Cryotherapy, and Moist heat  PLAN FOR NEXT SESSION:   -continue to address cervical ROM/pain -address global weakness, deconditioning, and imbalance.   Note: Portions of this document were prepared using Dragon voice recognition software and although reviewed may contain unintentional dictation errors in syntax, grammar, or spelling.  Lonni KATHEE Gainer PT ,DPT Physical Therapist- [place]  [facility]   "

## 2024-04-09 NOTE — Therapy (Signed)
 " OUTPATIENT SPEECH LANGUAGE PATHOLOGY  SWALLOW TREATMENT 10TH SESSION PROGRESS NOTE     Patient Name: Shawn Lam MRN: [id number] DOB:May 14, 1950, 73 y.o., male Today's Date: 04/09/2024  PCP: Greig Ring, MD REFERRING PROVIDER: Greig Ring, MD  Speech Therapy Progress Note  Dates of Reporting Period: 02/27/2024 to 04/09/2024  Objective: Patient has been seen for 10 speech therapy sessions this reporting period targeting oropharyngeal dysphagia and dysphonia. Patient is making progress toward LTGs and  STGs this reporting period. See skilled intervention, clinical impressions, and goals below for details.    End of Session - 04/09/24 0954     Visit Number 20    Number of Visits 43    Date for Recertification  06/18/24    Authorization Type Medicare Part A    Progress Note Due on Visit 20    SLP Start Time 0930    SLP Stop Time  1015    SLP Time Calculation (min) 45 min    Activity Tolerance Patient tolerated treatment well          Past Medical History:  Diagnosis Date   Actinic keratosis 02/04/2021   right lateral neck   Anemia    Anxiety    Asymptomatic LV dysfunction    50-55% by echo 06/2016   BCC (basal cell carcinoma of skin) 08/05/2020   left neck infra auricular - nodular pattern - treated with [facility] 09/17/2020   Berger's disease    Blood transfusion without reported diagnosis 04/11/2018   CAD (coronary artery disease) 06/2005   PCI of LAD and RCA   Cancer (HCC)    Cataract 04/11/2016   04/11/2016   Depression    Dilated aortic root    4cm at sinus of Valsalva   Hematuria    Hyperlipidemia    Hypothyroidism    Insomnia    Left ureteral stone    Orthostatic hypotension    negative tilt table   OSA (obstructive sleep apnea)    moderate with AHI 17/hr, uses CPAP nightly(01/15/19 - has Inspire implant)   Osteoporosis 04/11/2016   PVC's (premature ventricular contractions) 07/07/2016   Renal disorder    PT IS SEEING DR. BETSEY- NEPHROLOGIST    Rosacea    Sleep apnea 04/11/2016   04/11/2016   Ulcer 04/11/2018   Urticaria    Past Surgical History:  Procedure Laterality Date   ANTERIOR CERVICAL DECOMP/DISCECTOMY FUSION N/A 03/13/2023   Procedure: CERVICAL FOUR-FIVE, CERVICAL FIVE-SIX ANTERIOR CERVICAL DISCECTOMY/DECOMPRESSION FUSION;  Surgeon: Onetha Kuba, MD;  Location: [facility] OR;  Service: Neurosurgery;  Laterality: N/A;  3C   CARDIAC CATHETERIZATION  09/06/2013   [facility]   CARDIAC CATHETERIZATION     COLONOSCOPY WITH PROPOFOL  N/A 01/21/2019   Procedure: COLONOSCOPY WITH PROPOFOL ;  Surgeon: Jinny Carmine, MD;  Location: [facility];  Service: Endoscopy;  Laterality: N/A;  sleep apnea   COLONOSCOPY WITH PROPOFOL  N/A 10/19/2021   Procedure: COLONOSCOPY WITH PROPOFOL ;  Surgeon: Jinny Carmine, MD;  Location: [facility] ENDOSCOPY;  Service: Endoscopy;  Laterality: N/A;   CORONARY ANGIOPLASTY  2004   STENT PLACEMENT   CYSTOSCOPY WITH RETROGRADE PYELOGRAM, URETEROSCOPY AND STENT PLACEMENT Left 03/19/2014   Procedure: CYSTOSCOPY WITH RETROGRADE PYELOGRAM, URETEROSCOPY AND STENT PLACEMENT;  Surgeon: Mickey Ricardo KATHEE Alvaro, MD;  Location: [facility] ORS;  Service: Urology;  Laterality: Left;   CYSTOSCOPY WITH RETROGRADE PYELOGRAM, URETEROSCOPY AND STENT PLACEMENT Bilateral 05/20/2015   Procedure:  CYSTOSCOPY WITH BILATERAL RETROGRADE PYELOGRAM,RIGHT  DIAGNOSTIC URETEROSCOPY ,LEFT URETEROSCOPY WITH HOLMIUM LASER  AND BILATERAL  STENT  PLACEMENT ;  Surgeon: Ricardo Likens, MD;  Location: [facility];  Service: Urology;  Laterality: Bilateral;   DRUG INDUCED ENDOSCOPY N/A 10/20/2017   Procedure: DRUG INDUCED SLEEP ENDOSCOPY;  Surgeon: Carlie Clark, MD;  Location: [facility];  Service: ENT;  Laterality: N/A;   EP IMPLANTABLE DEVICE N/A 05/13/2016   Procedure: Loop Recorder Insertion;  Surgeon: Elspeth JAYSON Sage, MD;  Location: [facility];  Service: Cardiovascular;  Laterality: N/A;   ESOPHAGOGASTRODUODENOSCOPY N/A 10/19/2021   Procedure:  ESOPHAGOGASTRODUODENOSCOPY (EGD);  Surgeon: Jinny Carmine, MD;  Location: [facility] ENDOSCOPY;  Service: Endoscopy;  Laterality: N/A;   ESOPHAGOGASTRODUODENOSCOPY (EGD) WITH PROPOFOL  N/A 11/30/2018   Procedure: ESOPHAGOGASTRODUODENOSCOPY (EGD) WITH PROPOFOL ;  Surgeon: Therisa Bi, MD;  Location: [facility] ENDOSCOPY;  Service: Gastroenterology;  Laterality: N/A;   ESOPHAGOGASTRODUODENOSCOPY (EGD) WITH PROPOFOL  N/A 12/01/2018   Procedure: ESOPHAGOGASTRODUODENOSCOPY (EGD) WITH PROPOFOL ;  Surgeon: Jinny Carmine, MD;  Location: [facility] ENDOSCOPY;  Service: Endoscopy;  Laterality: N/A;   HOLMIUM LASER APPLICATION Bilateral 05/20/2015   Procedure: HOLMIUM LASER APPLICATION;  Surgeon: Ricardo Likens, MD;  Location: [facility];  Service: Urology;  Laterality: Bilateral;   IMPLIMENTATION OF A HYPOGLOSSAL NERVE STIMULATOR  12/08/2017   OSA    IR GASTROSTOMY TUBE MOD SED  01/18/2024   IR RADIOLOGIST EVAL & MGMT  02/13/2024   LEFT HEART CATHETERIZATION WITH CORONARY ANGIOGRAM N/A 09/06/2013   Procedure: LEFT HEART CATHETERIZATION WITH CORONARY ANGIOGRAM;  Surgeon: Victory LELON Claudene DOUGLAS, MD;  Location: [facility] C[facility];  Service: Cardiovascular;  Laterality: N/A;   LEFT KNEE CAP  SURGERY ABOUT 40 YRS AGO  ? 1970     SPINE SURGERY  Desk in neck   STONE EXTRACTION WITH BASKET Left 03/19/2014   Procedure: STONE EXTRACTION WITH BASKET;  Surgeon: Mickey Ricardo KATHEE Likens, MD;  Location: [facility];  Service: Urology;  Laterality: Left;   TRANSURETHRAL RESECTION OF PROSTATE N/A 08/25/2021   Procedure: TRANSURETHRAL RESECTION OF THE PROSTATE (TURP);  Surgeon: Likens Ricardo, MD;  Location: [facility];  Service: Urology;  Laterality: N/A;  1 HR   Patient Active Problem List   Diagnosis Date Noted   Abnormal PET scan of lung 03/29/2024   S/P gastrostomy tube (G tube) placement, follow-up exam 02/18/2024   Granulation tissue of site of gastrostomy 02/06/2024   Protein-calorie malnutrition, severe 01/23/2024   Multifocal pneumonia 01/22/2024   Fall  01/03/2024   Hematoma of right hip 01/03/2024   Chronic pulmonary aspiration 12/29/2023   Silent aspiration 12/29/2023   Moderate protein-calorie malnutrition (weight for age 64-74% of standard) 12/29/2023   Bilateral leg weakness 12/29/2023   Chronic respiratory failure with hypoxia, on home oxygen  therapy (HCC) 12/29/2023   Chronic constipation 12/29/2023   Somnolence 12/29/2023   Frequent falls 12/29/2023   Feeding difficulties, unspecified 12/20/2023   Shortness of breath 12/20/2023   Abnormal CT of the chest 10/10/2023   Elevated blood sugar 06/30/2023   Carotid stenosis 05/11/2023   Spinal stenosis in cervical region 03/13/2023   Osteoarthritis of spine with radiculopathy, cervical region 07/14/2022   Cervicogenic headache 11/30/2021   Chronic neck pain 11/30/2021   Weight loss, non-intentional    Dysphagia    Polyp of colon    Prostatic hyperplasia 08/25/2021   Chronic insomnia 03/19/2020   Peripheral neuropathy 03/19/2020   Iron deficiency anemia 01/06/2020   Allergic dermatitis 07/08/2019   Osteoarthritis of left knee 02/26/2019   History of colonic polyps    History of duodenal ulcer    CKD (chronic kidney disease),  stage IIIa    Bilateral chronic knee pain 11/09/2018   Epistaxis 10/02/2018   Subjective tinnitus, bilateral 10/02/2018   Chronic pain of right thumb 02/22/2018   Acute bilateral thoracic back pain 01/23/2018   Osteoporosis without current pathological fracture 02/10/2017   Hypothyroidism 11/02/2016   Thoracic compression fracture (HCC) 09/06/2016   PVC's (premature ventricular contractions) 07/07/2016   Hypoalbuminemia 06/28/2016   IgA nephropathy 06/07/2016   Acute buttock pain 05/23/2016   MDD (major depressive disorder), recurrent episode, moderate (HCC) 11/20/2015   OSA (obstructive sleep apnea) 02/05/2014   Orthostatic hypotension 02/07/2013   Scotoma involving central area 01/29/2013   Optic neuropathy 01/28/2013   HEMATURIA UNSPECIFIED  01/21/2010   HLD (hyperlipidemia) 07/05/2006   Generalized anxiety disorder 07/05/2006   CAD (coronary artery disease) 07/05/2006   GERD 07/05/2006    ONSET DATE: 03/2023 following ACDF; Date of referral 12/28/2023: date of this referral 02/01/2024  REFERRING DIAG:  T17.908D (ICD-10-CM) - Chronic pulmonary aspiration, subsequent encounter  T17.900D (ICD-10-CM) - Silent aspiration, subsequent encounter  E44.0 (ICD-10-CM) - Moderate protein-calorie malnutrition  R13.10 (ICD-10-CM) - Dysphagia, unspecified type    THERAPY DIAG:  Dysphonia  Dysphagia, oropharyngeal phase  Rationale for Evaluation and Treatment Rehabilitation  SUBJECTIVE:   PERTINENT HISTORY and DIAGNOSTIC FINDINGS:  Pt is a 73 yo male w/ ACDF surgery 03/2023.  PMH: GERD(Not on a PPI per chart), Actinic keratosis (02/04/2021), Anemia, Anxiety, Asymptomatic LV dysfunction, BCC (basal cell carcinoma of skin) (08/05/2020), Berger's disease, CAD (coronary artery disease) (06/2005), resting tremor, Depression, Dilated aortic root (HCC), Hematuria, Hyperlipidemia, Hypothyroidism, Insomnia, Left ureteral stone, Orthostatic hypotension, OSA (obstructive sleep apnea), PVC's (premature ventricular contractions) (07/07/2016), Renal disorder, Rosacea, and Urticaria.  Pt is followed by Cardiology for Coronary Artery Stenosis currently; also followed for Migraines.     EGD in 2023 revealed Gastritis.      CT of Chest 10/2023: 1. Interim development of patchy consolidation and ground-glass   disease most evident at the right middle lobe and right lower lobes   with additional areas of mild ground-glass disease and scattered   clustered nodularity, findings are suspicious for multifocal   infection/pneumonia. Imaging follow-up to resolution is recommended.   2. Emphysema.   3. Aortic atherosclerosis.            Dysphagia - reported by pt as Chronic, ever since neck surgery in December 2024. Pt was seen in the emergency room s/p Fall,  notes detailing that he slipped on soap in the shower, fell backwards on right side hit head and neck against shower seat, possible brief loss of consciousness, per chart.  ACDF 03/2023 per chart.  Since the neck surgery in 03/2023, he has noted food, liquids and pill getting stuck on the right side of my throat more.  I used to cough a lot more but not as much now.  I cough up what gets stuck..  He has lost significant weight per chart notes due to decreased eating.  Pt endorsed using his home O2 when he feels SOB.   MBSS 12/26/2023 Patient appears to present w/ Chronic, moderate-severe Pharyngeal phase dysphagia per this assessment today. It appeared there was some impact from changes in the Cervical Esophagus s/p ACDF surgery(03/2023 per chart notes) including structural/tissue changes in/aournd the CP segment and cervical esophagus. Pt with intermittent silent aspiration of thin liquids.   Recommend continue current diet of easy to eat foods at home; thin liquids via cup- at this time. Oral care Before any oral intake. Swallow  Strategies Recommended: No straws, moistened foods, SMALL bites/sips, Head Turn RIGHT w/ slight chin tuck, multiple swallows to aid clearing, AND strong Cough/throat clear b/t bites/sips and at end of meal.  D/t the Chronicity of pt's Pharyngeal phase Dysphagia w/ weight loss and pulmonary impact per recent Chest CT, pt and Family may need to discuss alternative means of nutrition/hydration as support w/ his Medical Team/PCP, while seeking swallow therapy.  Received ENT report dated 01/12/2024 Laryngoscopy revealed  Moderate vocal fold bowing bilaterally and moderate glottic gamp with phonation Impression/plan: Aspiration on MBSS and significant weight loss since ACDF last year. TVF shows moderate sized glottic gap on laryngoscoy but mobile vocal folds bilaterally. Pooling in pyriform on MBSS but no significant pooling of secretions on laryngoscopy. I feel that between  speech therpay and possibly bilateral injection laryngoplasty, hopefully his swallowing will significantly improve. Will make referral to Dr Brien at [facility].   Pt and his wife report appt with Dr Brien in January 2026.  10/10/205 PEG placed  01/22/2024 thru 01/24/2024 Hospitalized d/t fall and confusion CT of the chest on arrival showed multifocal pneumonia and possible free air. When reviewed further with interventional radiology team it was determined this was an appropriate amount of free air secondary to recent PEG tube placement. Patient was initiated on antibiotic therapy for pneumonia and improved significantly.   02/07/2024 CT Chest 1. Evolving multifocal inflammatory infiltrates, progressive within the right upper lobe and improved within the lung bases bilaterally. Recommend appropriate clinical management and imaging follow-up per clinical course. 2. Mild emphysema. Given emphysema as an independent lung cancer risk factor, consider evaluation for a low-dose CT lung cancer screening program if the patient is 73 years old and otherwise eligible.   PAIN:  Are you having pain? No  FALLS: Has patient fallen in last 6 months?  See PT evaluation for details  LIVING ENVIRONMENT: Lives with: lives with their spouse Lives in: House/apartment  PLOF:  Level of assistance: Independent with ADLs, Independent with IADLs Employment: Retired   PATIENT GOALS  to improve swallow function, nutrition and hydration, decrease aspiration risks  SUBJECTIVE STATEMENT: Pt pleasant,  Pt accompanied by: self   OBJECTIVE:   TODAY'S TREATMENT:  Skilled treatment session focused on pt's dysphagia goals. SLP facilitated the session by providing the following interventions:     This clinical research associate received secure chat from pt's PT that reported low BP with near fall d/t orthostatics. PT requested this writer to recheck BP (1015-BP 88/65). Pt was not able to tolerate orange electrolyte drink  d/t burning. Pt was able to tolerate orange Gatorade during this session with intermittent throat clears as well as when consuming water .    Additionally pt reports receiving new formula (he thinks this happened on Saturday) as well as increasing the free water  per MD recommendations. He states that he has not stopped the finasteride  and is taking his midodrine  10mg  3 times per day.   After consuming additional water  and 2 ounces of Gatorade, pt's BP was 98/66. Education provided to pt on monitoring blood pressure throughout the day and to take extra caution when standing up. Pt voiced understanding and provided that his wife had dropped him off and was picking him up today. Pt transported via transport chair.       PATIENT EDUCATION: Education details: as above Person educated: Patient and Spouse Education method: Chief Technology Officer Education comprehension: needs further education  HOME EXERCISE PROGRAM:     See above   GOALS: Goals  reviewed with patient? Yes   SHORT TERM GOALS: Target date: 10 sessions            Updated 02/28/2024            Updated 02/09/2024            Update 03/26/2024 With Mod I, pt will perform compensatory swallow strategies (chin tuck, multiple swallows, head turn) to eliminate s/s of aspiration when consuming least restrictive diet. Baseline: Goal status: MET: no overt s/sx with water  consumption this date   2.  With Min A, pt will complete (supraglottic, swallow, Mednelson maneuver, effortful swallow) to improve oral motor weakness, tongue base retraction, hyolarnygeal excursion, airway protection and/or clearance of the bolus through the pharynx. Baseline:  Goal status: IN PROGRESS: progress made; moderate A: Progress made, fluctuating between supervision and independent cues   3. Pt will demonstrate overt s/s of aspiration when consuming ice chips.              Baseline: Goal status: IN PROGRESSING: no overt s/s of aspiration observed during  today's session: Intermittent overt s/s observed     LONG TERM GOALS: Target date: 06/18/2024             Updated 02/28/2024             Updated: 02/09/2024             Updated: 03/26/2024    With Mod I, pt will demonstrate the ability to adequately self-monitor swallowing skills and perform appropriate compensatory techniques to reduce s/s of aspiration and to safely consume least restrictive diet.     Baseline:  Goal status: IN PROGRESS: continues to be appropriate goal   2.  Pt will participate in repeat instrumental swallow evaluation to assess least restrictive diet.  Baseline:  Goal status: IN PROGRESS: continues to be appropriate goal: Modified Barium scheduled completed 11/21; would benefit from repeat MBSS  ASSESSMENT:  CLINICAL IMPRESSION: Patient is a 73 y.o. male who was seen today for a clinical swallow re-evaluation d/t severe pharyngeal phase dysphagia following ACDF (03/2023).    See the above treatment note for details.   OBJECTIVE IMPAIRMENTS include dysphagia. These impairments are limiting patient from safety when swallowing. Factors affecting potential to achieve goals and functional outcome are severity of impairments and malnutrition. Patient will benefit from skilled SLP services to address above impairments and improve overall function.  REHAB POTENTIAL: Good  PLAN: SLP FREQUENCY: 1-2x/week  SLP DURATION: 12 weeks  PLANNED INTERVENTIONS: Aspiration precaution training, Pharyngeal strengthening exercises, Diet toleration management , Oral motor exercises, SLP instruction and feedback, Compensatory strategies, and Patient/family education   Kimbely Whiteaker B. Rubbie, M.S., CCC-SLP, CBIS Speech-Language Pathologist Certified Brain Injury Specialist [facility] [employer] [facility]  [facility] [phone] Ascom [phone] Fax [phone]               "

## 2024-04-10 ENCOUNTER — Other Ambulatory Visit: Payer: Self-pay | Admitting: Family Medicine

## 2024-04-10 ENCOUNTER — Ambulatory Visit: Payer: Self-pay

## 2024-04-12 ENCOUNTER — Ambulatory Visit: Admitting: Speech Pathology

## 2024-04-12 ENCOUNTER — Ambulatory Visit: Admitting: Sleep Medicine

## 2024-04-12 ENCOUNTER — Ambulatory Visit: Attending: Family Medicine

## 2024-04-12 DIAGNOSIS — R1313 Dysphagia, pharyngeal phase: Secondary | ICD-10-CM | POA: Insufficient documentation

## 2024-04-12 DIAGNOSIS — M6281 Muscle weakness (generalized): Secondary | ICD-10-CM | POA: Insufficient documentation

## 2024-04-12 DIAGNOSIS — M542 Cervicalgia: Secondary | ICD-10-CM | POA: Insufficient documentation

## 2024-04-12 DIAGNOSIS — M546 Pain in thoracic spine: Secondary | ICD-10-CM | POA: Insufficient documentation

## 2024-04-12 DIAGNOSIS — R1312 Dysphagia, oropharyngeal phase: Secondary | ICD-10-CM

## 2024-04-12 DIAGNOSIS — R2689 Other abnormalities of gait and mobility: Secondary | ICD-10-CM | POA: Insufficient documentation

## 2024-04-12 DIAGNOSIS — R269 Unspecified abnormalities of gait and mobility: Secondary | ICD-10-CM | POA: Insufficient documentation

## 2024-04-12 DIAGNOSIS — R490 Dysphonia: Secondary | ICD-10-CM | POA: Insufficient documentation

## 2024-04-12 NOTE — Telephone Encounter (Signed)
 Last office visit?

## 2024-04-12 NOTE — Therapy (Signed)
 " OUTPATIENT SPEECH LANGUAGE PATHOLOGY  SWALLOW TREATMENT     Patient Name: Shawn Lam MRN: [id number] DOB:1950/09/17, 74 y.o., male Today's Date: 04/12/2024  PCP: Greig Ring, MD REFERRING PROVIDER: Greig Ring, MD    End of Session - 04/12/24 1629     Visit Number 21    Number of Visits 43    Date for Recertification  06/18/24    Authorization Type Medicare Part A    Progress Note Due on Visit 30    SLP Start Time 0845    SLP Stop Time  0920    SLP Time Calculation (min) 35 min    Activity Tolerance Patient tolerated treatment well          Past Medical History:  Diagnosis Date   Actinic keratosis 02/04/2021   right lateral neck   Anemia    Anxiety    Asymptomatic LV dysfunction    50-55% by echo 06/2016   BCC (basal cell carcinoma of skin) 08/05/2020   left neck infra auricular - nodular pattern - treated with [facility] 09/17/2020   Berger's disease    Blood transfusion without reported diagnosis 04/11/2018   CAD (coronary artery disease) 06/2005   PCI of LAD and RCA   Cancer (HCC)    Cataract 04/11/2016   04/11/2016   Depression    Dilated aortic root    4cm at sinus of Valsalva   Hematuria    Hyperlipidemia    Hypothyroidism    Insomnia    Left ureteral stone    Orthostatic hypotension    negative tilt table   OSA (obstructive sleep apnea)    moderate with AHI 17/hr, uses CPAP nightly(01/15/19 - has Inspire implant)   Osteoporosis 04/11/2016   PVC's (premature ventricular contractions) 07/07/2016   Renal disorder    PT IS SEEING DR. BETSEY- NEPHROLOGIST   Rosacea    Sleep apnea 04/11/2016   04/11/2016   Ulcer 04/11/2018   Urticaria    Past Surgical History:  Procedure Laterality Date   ANTERIOR CERVICAL DECOMP/DISCECTOMY FUSION N/A 03/13/2023   Procedure: CERVICAL FOUR-FIVE, CERVICAL FIVE-SIX ANTERIOR CERVICAL DISCECTOMY/DECOMPRESSION FUSION;  Surgeon: Onetha Kuba, MD;  Location: [facility] OR;  Service: Neurosurgery;  Laterality: N/A;  3C   CARDIAC  CATHETERIZATION  09/06/2013   [facility]   CARDIAC CATHETERIZATION     COLONOSCOPY WITH PROPOFOL  N/A 01/21/2019   Procedure: COLONOSCOPY WITH PROPOFOL ;  Surgeon: Jinny Carmine, MD;  Location: [facility];  Service: Endoscopy;  Laterality: N/A;  sleep apnea   COLONOSCOPY WITH PROPOFOL  N/A 10/19/2021   Procedure: COLONOSCOPY WITH PROPOFOL ;  Surgeon: Jinny Carmine, MD;  Location: [facility] ENDOSCOPY;  Service: Endoscopy;  Laterality: N/A;   CORONARY ANGIOPLASTY  2004   STENT PLACEMENT   CYSTOSCOPY WITH RETROGRADE PYELOGRAM, URETEROSCOPY AND STENT PLACEMENT Left 03/19/2014   Procedure: CYSTOSCOPY WITH RETROGRADE PYELOGRAM, URETEROSCOPY AND STENT PLACEMENT;  Surgeon: Mickey Ricardo KATHEE Alvaro, MD;  Location: [facility] ORS;  Service: Urology;  Laterality: Left;   CYSTOSCOPY WITH RETROGRADE PYELOGRAM, URETEROSCOPY AND STENT PLACEMENT Bilateral 05/20/2015   Procedure:  CYSTOSCOPY WITH BILATERAL RETROGRADE PYELOGRAM,RIGHT  DIAGNOSTIC URETEROSCOPY ,LEFT URETEROSCOPY WITH HOLMIUM LASER  AND BILATERAL  STENT PLACEMENT ;  Surgeon: Ricardo Alvaro, MD;  Location: [facility];  Service: Urology;  Laterality: Bilateral;   DRUG INDUCED ENDOSCOPY N/A 10/20/2017   Procedure: DRUG INDUCED SLEEP ENDOSCOPY;  Surgeon: Carlie Clark, MD;  Location: [facility];  Service: ENT;  Laterality: N/A;   EP IMPLANTABLE DEVICE N/A 05/13/2016   Procedure:  Loop Recorder Insertion;  Surgeon: Elspeth JAYSON Sage, MD;  Location: [facility] INVASIVE CV LAB;  Service: Cardiovascular;  Laterality: N/A;   ESOPHAGOGASTRODUODENOSCOPY N/A 10/19/2021   Procedure: ESOPHAGOGASTRODUODENOSCOPY (EGD);  Surgeon: Jinny Carmine, MD;  Location: [facility] ENDOSCOPY;  Service: Endoscopy;  Laterality: N/A;   ESOPHAGOGASTRODUODENOSCOPY (EGD) WITH PROPOFOL  N/A 11/30/2018   Procedure: ESOPHAGOGASTRODUODENOSCOPY (EGD) WITH PROPOFOL ;  Surgeon: Therisa Bi, MD;  Location: [facility] ENDOSCOPY;  Service: Gastroenterology;  Laterality: N/A;   ESOPHAGOGASTRODUODENOSCOPY (EGD) WITH PROPOFOL  N/A  12/01/2018   Procedure: ESOPHAGOGASTRODUODENOSCOPY (EGD) WITH PROPOFOL ;  Surgeon: Jinny Carmine, MD;  Location: [facility] ENDOSCOPY;  Service: Endoscopy;  Laterality: N/A;   HOLMIUM LASER APPLICATION Bilateral 05/20/2015   Procedure: HOLMIUM LASER APPLICATION;  Surgeon: Ricardo Likens, MD;  Location: [facility];  Service: Urology;  Laterality: Bilateral;   IMPLIMENTATION OF A HYPOGLOSSAL NERVE STIMULATOR  12/08/2017   OSA    IR GASTROSTOMY TUBE MOD SED  01/18/2024   IR RADIOLOGIST EVAL & MGMT  02/13/2024   LEFT HEART CATHETERIZATION WITH CORONARY ANGIOGRAM N/A 09/06/2013   Procedure: LEFT HEART CATHETERIZATION WITH CORONARY ANGIOGRAM;  Surgeon: Victory LELON Claudene DOUGLAS, MD;  Location: [facility] CATH LAB;  Service: Cardiovascular;  Laterality: N/A;   LEFT KNEE CAP  SURGERY ABOUT 40 YRS AGO  ? 1970     SPINE SURGERY  Desk in neck   STONE EXTRACTION WITH BASKET Left 03/19/2014   Procedure: STONE EXTRACTION WITH BASKET;  Surgeon: Mickey Ricardo KATHEE Likens, MD;  Location: [facility];  Service: Urology;  Laterality: Left;   TRANSURETHRAL RESECTION OF PROSTATE N/A 08/25/2021   Procedure: TRANSURETHRAL RESECTION OF THE PROSTATE (TURP);  Surgeon: Likens Ricardo, MD;  Location: [facility];  Service: Urology;  Laterality: N/A;  1 HR   Patient Active Problem List   Diagnosis Date Noted   Abnormal PET scan of lung 03/29/2024   S/P gastrostomy tube (G tube) placement, follow-up exam 02/18/2024   Granulation tissue of site of gastrostomy 02/06/2024   Protein-calorie malnutrition, severe 01/23/2024   Multifocal pneumonia 01/22/2024   Fall 01/03/2024   Hematoma of right hip 01/03/2024   Chronic pulmonary aspiration 12/29/2023   Silent aspiration 12/29/2023   Moderate protein-calorie malnutrition (weight for age 55-74% of standard) 12/29/2023   Bilateral leg weakness 12/29/2023   Chronic respiratory failure with hypoxia, on home oxygen  therapy (HCC) 12/29/2023   Chronic constipation 12/29/2023   Somnolence 12/29/2023   Frequent falls  12/29/2023   Feeding difficulties, unspecified 12/20/2023   Shortness of breath 12/20/2023   Abnormal CT of the chest 10/10/2023   Elevated blood sugar 06/30/2023   Carotid stenosis 05/11/2023   Spinal stenosis in cervical region 03/13/2023   Osteoarthritis of spine with radiculopathy, cervical region 07/14/2022   Cervicogenic headache 11/30/2021   Chronic neck pain 11/30/2021   Weight loss, non-intentional    Dysphagia    Polyp of colon    Prostatic hyperplasia 08/25/2021   Chronic insomnia 03/19/2020   Peripheral neuropathy 03/19/2020   Iron deficiency anemia 01/06/2020   Allergic dermatitis 07/08/2019   Osteoarthritis of left knee 02/26/2019   History of colonic polyps    History of duodenal ulcer    CKD (chronic kidney disease), stage IIIa    Bilateral chronic knee pain 11/09/2018   Epistaxis 10/02/2018   Subjective tinnitus, bilateral 10/02/2018   Chronic pain of right thumb 02/22/2018   Acute bilateral thoracic back pain 01/23/2018   Osteoporosis without current pathological fracture 02/10/2017   Hypothyroidism 11/02/2016   Thoracic compression fracture (HCC) 09/06/2016   PVC's (  premature ventricular contractions) 07/07/2016   Hypoalbuminemia 06/28/2016   IgA nephropathy 06/07/2016   Acute buttock pain 05/23/2016   MDD (major depressive disorder), recurrent episode, moderate (HCC) 11/20/2015   OSA (obstructive sleep apnea) 02/05/2014   Orthostatic hypotension 02/07/2013   Scotoma involving central area 01/29/2013   Optic neuropathy 01/28/2013   HEMATURIA UNSPECIFIED 01/21/2010   HLD (hyperlipidemia) 07/05/2006   Generalized anxiety disorder 07/05/2006   CAD (coronary artery disease) 07/05/2006   GERD 07/05/2006    ONSET DATE: 03/2023 following ACDF; Date of referral 12/28/2023: date of this referral 02/01/2024  REFERRING DIAG:  T17.908D (ICD-10-CM) - Chronic pulmonary aspiration, subsequent encounter  T17.900D (ICD-10-CM) - Silent aspiration, subsequent  encounter  E44.0 (ICD-10-CM) - Moderate protein-calorie malnutrition  R13.10 (ICD-10-CM) - Dysphagia, unspecified type    THERAPY DIAG:  Dysphagia, oropharyngeal phase  Rationale for Evaluation and Treatment Rehabilitation  SUBJECTIVE:   PERTINENT HISTORY and DIAGNOSTIC FINDINGS:  Pt is a 74 yo male w/ ACDF surgery 03/2023.  PMH: GERD(Not on a PPI per chart), Actinic keratosis (02/04/2021), Anemia, Anxiety, Asymptomatic LV dysfunction, BCC (basal cell carcinoma of skin) (08/05/2020), Berger's disease, CAD (coronary artery disease) (06/2005), resting tremor, Depression, Dilated aortic root (HCC), Hematuria, Hyperlipidemia, Hypothyroidism, Insomnia, Left ureteral stone, Orthostatic hypotension, OSA (obstructive sleep apnea), PVC's (premature ventricular contractions) (07/07/2016), Renal disorder, Rosacea, and Urticaria.  Pt is followed by Cardiology for Coronary Artery Stenosis currently; also followed for Migraines.     EGD in 2023 revealed Gastritis.      CT of Chest 10/2023: 1. Interim development of patchy consolidation and ground-glass   disease most evident at the right middle lobe and right lower lobes   with additional areas of mild ground-glass disease and scattered   clustered nodularity, findings are suspicious for multifocal   infection/pneumonia. Imaging follow-up to resolution is recommended.   2. Emphysema.   3. Aortic atherosclerosis.            Dysphagia - reported by pt as Chronic, ever since neck surgery in December 2024. Pt was seen in the emergency room s/p Fall, notes detailing that he slipped on soap in the shower, fell backwards on right side hit head and neck against shower seat, possible brief loss of consciousness, per chart.  ACDF 03/2023 per chart.  Since the neck surgery in 03/2023, he has noted food, liquids and pill getting stuck on the right side of my throat more.  I used to cough a lot more but not as much now.  I cough up what gets stuck..  He has lost  significant weight per chart notes due to decreased eating.  Pt endorsed using his home O2 when he feels SOB.   MBSS 12/26/2023 Patient appears to present w/ Chronic, moderate-severe Pharyngeal phase dysphagia per this assessment today. It appeared there was some impact from changes in the Cervical Esophagus s/p ACDF surgery(03/2023 per chart notes) including structural/tissue changes in/aournd the CP segment and cervical esophagus. Pt with intermittent silent aspiration of thin liquids.   Recommend continue current diet of easy to eat foods at home; thin liquids via cup- at this time. Oral care Before any oral intake. Swallow Strategies Recommended: No straws, moistened foods, SMALL bites/sips, Head Turn RIGHT w/ slight chin tuck, multiple swallows to aid clearing, AND strong Cough/throat clear b/t bites/sips and at end of meal.  D/t the Chronicity of pt's Pharyngeal phase Dysphagia w/ weight loss and pulmonary impact per recent Chest CT, pt and Family may need to discuss alternative means of  nutrition/hydration as support w/ his Medical Team/PCP, while seeking swallow therapy.  Received ENT report dated 01/12/2024 Laryngoscopy revealed  Moderate vocal fold bowing bilaterally and moderate glottic gamp with phonation Impression/plan: Aspiration on MBSS and significant weight loss since ACDF last year. TVF shows moderate sized glottic gap on laryngoscoy but mobile vocal folds bilaterally. Pooling in pyriform on MBSS but no significant pooling of secretions on laryngoscopy. I feel that between speech therpay and possibly bilateral injection laryngoplasty, hopefully his swallowing will significantly improve. Will make referral to [facility] Brien at [facility].   Pt and his wife report appt with Dr Brien in January 2026.  10/10/205 PEG placed  01/22/2024 thru 01/24/2024 Hospitalized d/t fall and confusion CT of the chest on arrival showed multifocal pneumonia and possible free air. When reviewed further  with interventional radiology team it was determined this was an appropriate amount of free air secondary to recent PEG tube placement. Patient was initiated on antibiotic therapy for pneumonia and improved significantly.   02/07/2024 CT Chest 1. Evolving multifocal inflammatory infiltrates, progressive within the right upper lobe and improved within the lung bases bilaterally. Recommend appropriate clinical management and imaging follow-up per clinical course. 2. Mild emphysema. Given emphysema as an independent lung cancer risk factor, consider evaluation for a low-dose CT lung cancer screening program if the patient is 74 years old and otherwise eligible.   PAIN:  Are you having pain? No  FALLS: Has patient fallen in last 6 months?  See PT evaluation for details  LIVING ENVIRONMENT: Lives with: lives with their spouse Lives in: House/apartment  PLOF:  Level of assistance: Independent with ADLs, Independent with IADLs Employment: Retired   PATIENT GOALS  to improve swallow function, nutrition and hydration, decrease aspiration risks  SUBJECTIVE STATEMENT: Pt pleasant,  Pt accompanied by: self   OBJECTIVE:   TODAY'S TREATMENT:  Skilled treatment session focused on pt's dysphagia goals. SLP facilitated the session by providing the following interventions:     I had a little episode yesterday but [name] caught me.   Respiratory Muscle Strengthening: Pt independently able to maneuver devices correctly. EMST 75 set to 55 cm H2O: Pt able to complete three sets of 8 reps with cues helpful for quick bursts of air - increased to 10 reps per set IMST set to 55 cm H2O: Pt able to complete 3 sets of 6 reps independently, increased to 8 reps per set   Pharyngeal exercises: Masako: Pt able to complete 3 sets of 8 reps independently.  Head Extension Swallowing Exercise - Pt able to complete 3 sets of 8 repetitions independently.  CTAR: Pt able to independently complete 3 sets of  8 reps.    PO consumption: Skilled observation was provided of pt consuming thin liquids via cup. Pt with no overt s/s of aspiration (however, pt with confirmed silent aspiration)                    PATIENT EDUCATION: Education details: as above Person educated: Patient and Spouse Education method: Chief Technology Officer Education comprehension: needs further education  HOME EXERCISE PROGRAM:     See above   GOALS: Goals reviewed with patient? Yes   SHORT TERM GOALS: Target date: 10 sessions            Updated 02/28/2024            Updated 02/09/2024            Update 03/26/2024 With Mod I, pt will perform  compensatory swallow strategies (chin tuck, multiple swallows, head turn) to eliminate s/s of aspiration when consuming least restrictive diet. Baseline: Goal status: MET: no overt s/sx with water  consumption this date   2.  With Min A, pt will complete (supraglottic, swallow, Mednelson maneuver, effortful swallow) to improve oral motor weakness, tongue base retraction, hyolarnygeal excursion, airway protection and/or clearance of the bolus through the pharynx. Baseline:  Goal status: IN PROGRESS: progress made; moderate A: Progress made, fluctuating between supervision and independent cues   3. Pt will demonstrate overt s/s of aspiration when consuming ice chips.              Baseline: Goal status: IN PROGRESSING: no overt s/s of aspiration observed during today's session: Intermittent overt s/s observed     LONG TERM GOALS: Target date: 06/18/2024             Updated 02/28/2024             Updated: 02/09/2024             Updated: 03/26/2024    With Mod I, pt will demonstrate the ability to adequately self-monitor swallowing skills and perform appropriate compensatory techniques to reduce s/s of aspiration and to safely consume least restrictive diet.     Baseline:  Goal status: IN PROGRESS: continues to be appropriate goal   2.  Pt will participate in repeat  instrumental swallow evaluation to assess least restrictive diet.  Baseline:  Goal status: IN PROGRESS: continues to be appropriate goal: Modified Barium scheduled completed 11/21; would benefit from repeat MBSS  ASSESSMENT:  CLINICAL IMPRESSION: Patient is a 74 y.o. male who was seen today for a clinical swallow re-evaluation d/t severe pharyngeal phase dysphagia following ACDF (03/2023).    See the above treatment note for details.   OBJECTIVE IMPAIRMENTS include dysphagia. These impairments are limiting patient from safety when swallowing. Factors affecting potential to achieve goals and functional outcome are severity of impairments and malnutrition. Patient will benefit from skilled SLP services to address above impairments and improve overall function.  REHAB POTENTIAL: Good  PLAN: SLP FREQUENCY: 1-2x/week  SLP DURATION: 12 weeks  PLANNED INTERVENTIONS: Aspiration precaution training, Pharyngeal strengthening exercises, Diet toleration management , Oral motor exercises, SLP instruction and feedback, Compensatory strategies, and Patient/family education   Ariyon Mittleman B. Rubbie, M.S., CCC-SLP, CBIS Speech-Language Pathologist Certified Brain Injury Specialist [facility]  [facility] [phone] Ascom [phone] Fax [phone]               "

## 2024-04-12 NOTE — Telephone Encounter (Signed)
 Last office visit 03/29/24 for Drainage from [facility].  Last refilled 02/13/24 for #30 with 1 refill. Next Appt: CPE 04/16/24

## 2024-04-12 NOTE — Therapy (Signed)
 " OUTPATIENT PHYSICAL THERAPY TREATMENT   Patient Name: SALMAAN PATCHIN MRN: [id number] DOB:1950/09/19, 74 y.o., male Today's Date: 04/12/2024  END OF SESSION:  PT End of Session - 04/12/24 0802     Visit Number 20    Number of Visits 27    Date for Recertification  04/30/24    Progress Note Due on Visit 20    PT Start Time 0802    PT Stop Time 0845    PT Time Calculation (min) 43 min    Equipment Utilized During Treatment Gait belt    Activity Tolerance Patient tolerated treatment well    Behavior During Therapy [facility] for tasks assessed/performed         Past Medical History:  Diagnosis Date   Actinic keratosis 02/04/2021   right lateral neck   Anemia    Anxiety    Asymptomatic LV dysfunction    50-55% by echo 06/2016   BCC (basal cell carcinoma of skin) 08/05/2020   left neck infra auricular - nodular pattern - treated with [facility] 09/17/2020   Berger's disease    Blood transfusion without reported diagnosis 04/11/2018   CAD (coronary artery disease) 06/2005   PCI of LAD and RCA   Cancer (HCC)    Cataract 04/11/2016   04/11/2016   Depression    Dilated aortic root    4cm at sinus of Valsalva   Hematuria    Hyperlipidemia    Hypothyroidism    Insomnia    Left ureteral stone    Orthostatic hypotension    negative tilt table   OSA (obstructive sleep apnea)    moderate with AHI 17/hr, uses CPAP nightly(01/15/19 - has Inspire implant)   Osteoporosis 04/11/2016   PVC's (premature ventricular contractions) 07/07/2016   Renal disorder    PT IS SEEING DR. BETSEY- NEPHROLOGIST   Rosacea    Sleep apnea 04/11/2016   04/11/2016   Ulcer 04/11/2018   Urticaria    Past Surgical History:  Procedure Laterality Date   ANTERIOR CERVICAL DECOMP/DISCECTOMY FUSION N/A 03/13/2023   Procedure: CERVICAL FOUR-FIVE, CERVICAL FIVE-SIX ANTERIOR CERVICAL DISCECTOMY/DECOMPRESSION FUSION;  Surgeon: Onetha Kuba, MD;  Location: [facility] OR;  Service: Neurosurgery;  Laterality: N/A;  3C   CARDIAC  CATHETERIZATION  09/06/2013   [facility]   CARDIAC CATHETERIZATION     COLONOSCOPY WITH PROPOFOL  N/A 01/21/2019   Procedure: COLONOSCOPY WITH PROPOFOL ;  Surgeon: Jinny Carmine, MD;  Location: [facility];  Service: Endoscopy;  Laterality: N/A;  sleep apnea   COLONOSCOPY WITH PROPOFOL  N/A 10/19/2021   Procedure: COLONOSCOPY WITH PROPOFOL ;  Surgeon: Jinny Carmine, MD;  Location: [facility] ENDOSCOPY;  Service: Endoscopy;  Laterality: N/A;   CORONARY ANGIOPLASTY  2004   STENT PLACEMENT   CYSTOSCOPY WITH RETROGRADE PYELOGRAM, URETEROSCOPY AND STENT PLACEMENT Left 03/19/2014   Procedure: CYSTOSCOPY WITH RETROGRADE PYELOGRAM, URETEROSCOPY AND STENT PLACEMENT;  Surgeon: Mickey Ricardo KATHEE Alvaro, MD;  Location: [facility] ORS;  Service: Urology;  Laterality: Left;   CYSTOSCOPY WITH RETROGRADE PYELOGRAM, URETEROSCOPY AND STENT PLACEMENT Bilateral 05/20/2015   Procedure:  CYSTOSCOPY WITH BILATERAL RETROGRADE PYELOGRAM,RIGHT  DIAGNOSTIC URETEROSCOPY ,LEFT URETEROSCOPY WITH HOLMIUM LASER  AND BILATERAL  STENT PLACEMENT ;  Surgeon: Ricardo Alvaro, MD;  Location: [facility];  Service: Urology;  Laterality: Bilateral;   DRUG INDUCED ENDOSCOPY N/A 10/20/2017   Procedure: DRUG INDUCED SLEEP ENDOSCOPY;  Surgeon: Carlie Clark, MD;  Location: [facility];  Service: ENT;  Laterality: N/A;   EP IMPLANTABLE DEVICE N/A 05/13/2016   Procedure: Loop Recorder Insertion;  Surgeon: Elspeth JAYSON Sage, MD;  Location: [facility] INVASIVE CV LAB;  Service: Cardiovascular;  Laterality: N/A;   ESOPHAGOGASTRODUODENOSCOPY N/A 10/19/2021   Procedure: ESOPHAGOGASTRODUODENOSCOPY (EGD);  Surgeon: Jinny Carmine, MD;  Location: [facility] ENDOSCOPY;  Service: Endoscopy;  Laterality: N/A;   ESOPHAGOGASTRODUODENOSCOPY (EGD) WITH PROPOFOL  N/A 11/30/2018   Procedure: ESOPHAGOGASTRODUODENOSCOPY (EGD) WITH PROPOFOL ;  Surgeon: Therisa Bi, MD;  Location: [facility] ENDOSCOPY;  Service: Gastroenterology;  Laterality: N/A;   ESOPHAGOGASTRODUODENOSCOPY (EGD) WITH PROPOFOL  N/A  12/01/2018   Procedure: ESOPHAGOGASTRODUODENOSCOPY (EGD) WITH PROPOFOL ;  Surgeon: Jinny Carmine, MD;  Location: [facility] ENDOSCOPY;  Service: Endoscopy;  Laterality: N/A;   HOLMIUM LASER APPLICATION Bilateral 05/20/2015   Procedure: HOLMIUM LASER APPLICATION;  Surgeon: Ricardo Likens, MD;  Location: [facility];  Service: Urology;  Laterality: Bilateral;   IMPLIMENTATION OF A HYPOGLOSSAL NERVE STIMULATOR  12/08/2017   OSA    IR GASTROSTOMY TUBE MOD SED  01/18/2024   IR RADIOLOGIST EVAL & MGMT  02/13/2024   LEFT HEART CATHETERIZATION WITH CORONARY ANGIOGRAM N/A 09/06/2013   Procedure: LEFT HEART CATHETERIZATION WITH CORONARY ANGIOGRAM;  Surgeon: Victory LELON Claudene DOUGLAS, MD;  Location: [facility] CATH LAB;  Service: Cardiovascular;  Laterality: N/A;   LEFT KNEE CAP  SURGERY ABOUT 40 YRS AGO  ? 1970     SPINE SURGERY  Desk in neck   STONE EXTRACTION WITH BASKET Left 03/19/2014   Procedure: STONE EXTRACTION WITH BASKET;  Surgeon: Mickey Ricardo KATHEE Likens, MD;  Location: [facility];  Service: Urology;  Laterality: Left;   TRANSURETHRAL RESECTION OF PROSTATE N/A 08/25/2021   Procedure: TRANSURETHRAL RESECTION OF THE PROSTATE (TURP);  Surgeon: Likens Ricardo, MD;  Location: [facility];  Service: Urology;  Laterality: N/A;  1 HR   Patient Active Problem List   Diagnosis Date Noted   Abnormal PET scan of lung 03/29/2024   S/P gastrostomy tube (G tube) placement, follow-up exam 02/18/2024   Granulation tissue of site of gastrostomy 02/06/2024   Protein-calorie malnutrition, severe 01/23/2024   Multifocal pneumonia 01/22/2024   Fall 01/03/2024   Hematoma of right hip 01/03/2024   Chronic pulmonary aspiration 12/29/2023   Silent aspiration 12/29/2023   Moderate protein-calorie malnutrition (weight for age 85-74% of standard) 12/29/2023   Bilateral leg weakness 12/29/2023   Chronic respiratory failure with hypoxia, on home oxygen  therapy (HCC) 12/29/2023   Chronic constipation 12/29/2023   Somnolence 12/29/2023   Frequent falls  12/29/2023   Feeding difficulties, unspecified 12/20/2023   Shortness of breath 12/20/2023   Abnormal CT of the chest 10/10/2023   Elevated blood sugar 06/30/2023   Carotid stenosis 05/11/2023   Spinal stenosis in cervical region 03/13/2023   Osteoarthritis of spine with radiculopathy, cervical region 07/14/2022   Cervicogenic headache 11/30/2021   Chronic neck pain 11/30/2021   Weight loss, non-intentional    Dysphagia    Polyp of colon    Prostatic hyperplasia 08/25/2021   Chronic insomnia 03/19/2020   Peripheral neuropathy 03/19/2020   Iron deficiency anemia 01/06/2020   Allergic dermatitis 07/08/2019   Osteoarthritis of left knee 02/26/2019   History of colonic polyps    History of duodenal ulcer    CKD (chronic kidney disease), stage IIIa    Bilateral chronic knee pain 11/09/2018   Epistaxis 10/02/2018   Subjective tinnitus, bilateral 10/02/2018   Chronic pain of right thumb 02/22/2018   Acute bilateral thoracic back pain 01/23/2018   Osteoporosis without current pathological fracture 02/10/2017   Hypothyroidism 11/02/2016   Thoracic compression fracture (HCC) 09/06/2016   PVC's (premature ventricular contractions) 07/07/2016  Hypoalbuminemia 06/28/2016   IgA nephropathy 06/07/2016   Acute buttock pain 05/23/2016   MDD (major depressive disorder), recurrent episode, moderate (HCC) 11/20/2015   OSA (obstructive sleep apnea) 02/05/2014   Orthostatic hypotension 02/07/2013   Scotoma involving central area 01/29/2013   Optic neuropathy 01/28/2013   HEMATURIA UNSPECIFIED 01/21/2010   HLD (hyperlipidemia) 07/05/2006   Generalized anxiety disorder 07/05/2006   CAD (coronary artery disease) 07/05/2006   GERD 07/05/2006    PCP: Dr. Greig Ring    REFERRING PROVIDER: Dr. Arley Helling   REFERRING DIAG: M54.6 (ICD-10-CM) - Acute bilateral thoracic back pain  THERAPY DIAG:  Abnormality of gait  Muscle weakness (generalized)  Balance disorder  Pain in thoracic  spine  Cervicalgia  Rationale for Evaluation and Treatment: Rehabilitation  ONSET DATE: May 2025    SUBJECTIVE:                                                                                                                                                                                                         SUBJECTIVE STATEMENT  Pt denies any falls since the last visit.  Pt also notes his BP has been better, staying around 110 for the systolic pressure.  PERTINENT HISTORY:   73yoM referred to physical therapy for ongoing neck pain following cervical fusion in May 2020 and newly developed thoracic pain from T12 compression fracture that occurred sometime in Spring of 2025 (associated radicular pain pattern). Pt has pain localized to upper traps and suboccipitals. Pt attempted DC of prednisone  and gabapentin  two weeks prior to evaluation here, severe decline in function due to pain, barely able to walk for two weeks. He describes feeling increased fatigue and weakness to the point where he struggled to walk. Pt also reported repeated falls in the setting of orthostatic hypotension with him experiencing syncope when he stands up from a seated position. He describes feeling a burning sensation that is also localized to his scapulae and that it occurs when he is doing UE activities like washing his car that is likely the radicular pain from thoracic vertebrae compression fracture.  PAIN: Are you having pain? 03/05/2024: 6/10 today in the neck  PRECAUTIONS: None Pt has thoracic compression fractures and is s/p cervical spine surgery   WEIGHT BEARING RESTRICTIONS: No  FALLS:   Has patient fallen in last 6 months? Yes. Number of falls 6, pt reports that falls are happening in the context of syncope when standing up from a seated position (Orthostatic Hypotension) , where he blacks out and falls. He is following up with his  PCP, Dr. Karlene who is working with him on stabilizing his blood  pressure and she has recently taken him off Gabapentin  to see if this helps. Pt reports that he does note an improvement in his symptoms with less light headedness since going off medication.   LIVING ENVIRONMENT: Lives with: lives with their spouse Lives in: House/apartment Stairs: No Has following equipment at home: Single point cane and Environmental Consultant - 4 wheeled  OCCUPATION: Retired     PLOF: Independent  PATIENT GOALS: Improve balance, decrease falls.       OBJECTIVE:   Note: Objective measures were completed at evaluation unless otherwise noted. Reassessment Measures 02/09/24: -eyes closed balance, normal stance: <5sec with retropulsion each time, ModA falls recovery  -narrow stance eyes open balance: <10sec with LOB, ModA falls recovery  -Long distance AMB assessment c SPC: 111ft with 2 DOE standing breaks ~15 seconds each, 15sec; crossover gait intermittently without LOB; -standing vitals taken at end of walk: stable and comparable to prior seated vitals.  -cervical rotation ROM: 50deg Rt, 53 degree left  -shoulder flexion MMT: 5/5 Rt, 4+/5 left; shoulder ABDCT MMT: 5/5 bilat  -ankle DF assessment: can perform >10x full range while leaning against wall (adequate for gait needs)  -sensory testing about ankles, lower legs: (feels intact, but pt reports constance N/T below ankle to his toes)  Standing BP assessment: 02/28/24 -124/66mmHg 95bpm standing @ 0 -122/84mmHg 90bpm standing @ 1 110/41mmHg 92bpm standing @ 2  FUNCTIONAL GAIT ASSESSMENT  Date: 04/12/2024  Score  GAIT LEVEL SURFACE Instructions: Walk at your normal speed from here to the next mark (6 m) [20 ft]. (3) Normal - Walks 6 m (20 ft) in less than 5.5 seconds, no assistive devices, good speed, no evidence for imbalance, normal gait pattern, deviates no more than 15.24 cm (6 in) outside of the 30.48-cm (12-in) walkway width.  2.   CHANGE IN GAIT SPEED Instructions: Begin walking at your normal pace (for 1.5 m [5  ft]). When I tell you go, walk as fast as you can (for 1.5 m [5 ft]). When I tell you slow, walk as slowly as you can (for 1.5 m [5 ft]. (3) Normal - Able to smoothly change walking speed without loss of balance or gait deviation. Shows a significant difference in walking speeds between normal, fast, and slow speeds. Deviates no more than 15.24 cm (6 in) outside of the 30.48-cm (12-in) walkway width.  3.    GAIT WITH HORIZONTAL HEAD TURNS Instructions: Walk from here to the next mark 6 m (20 ft) away. Begin walking at your normal pace. Keep walking straight; after 3 steps, turn your head to the right and keep walking straight while looking to the right. After 3 more steps, turn your head to the left and keep walking straight while looking left. Continue alternating looking right and left. (3) Normal - Performs head turns smoothly with no change in gait. Deviates no more than 15.24 cm (6 in) outside 30.48-cm (12-in) walkway width.  4.   GAIT WITH VERTICAL HEAD TURNS Instructions: Walk from here to the next mark (6 m [20 ft]). Begin walking at your normal pace. Keep walking straight; after 3 steps, tip your head up and keep walking straight while looking up. After 3 more steps, tip your head down, keep walking straight while looking down. Continue  alternating looking up and down every 3 steps until you have completed 2 repetitions in each direction. (3) Normal - Performs head turns with  no change in gait. Deviates no more than 15.24 cm (6 in) outside 30.48-cm (12-in) walkway width.  5.  GAIT AND PIVOT TURN Instructions: Begin with walking at your normal pace. When I tell you, turn and stop, turn as quickly as you can to face the opposite direction and stop. (3) Normal - Pivot turns safely within 3 seconds and stops quickly with no loss of balance  6.   STEP OVER OBSTACLE Instructions: Begin walking at your normal speed. When you come to the shoe box, step over it, not around it, and keep walking. (3)  Normal - Is able to step over 2 stacked shoe boxes taped together (22.86 cm [9 in] total height) without changing gait speed; no evidence of imbalance.  7.   GAIT WITH NARROW BASE OF SUPPORT Instructions: Walk on the floor with arms folded across the chest, feet aligned heel to toe in tandem for a distance of 3.6 m [12 ft]. The number of steps taken in a straight line are counted for a maximum of 10 steps. (3) Normal - Is able to ambulate for 10 steps heel to toe with no staggering.  8.   GAIT WITH EYES CLOSED Instructions: Walk at your normal speed from here to the next mark (6 m [20 ft]) with your eyes closed. (0) Severe impairment - Cannot walk 6 m (20 ft) without assistance, severe gait deviations or imbalance, deviates greater than 38.1 cm (15 in) outside 30.48-cm (12-in) walkway width or will not attempt task.  9.   AMBULATING BACKWARDS Instructions: Walk backwards until I tell you to stop (3) Normal - Walks 6 m (20 ft), no assistive devices, good speed, no evidence for imbalance, normal gait pattern, deviates no more than 15.24 cm (6 in) outside 30.48-cm (12-in) walkway width.  10. STEPS Instructions: Walk up these stairs as you would at home (ie, using the rail if necessary). At the top turn around and walk down. (2) Mild impairment-Alternating feet, must use rail.  Total 26/30   Interpretation of scores: Non-Specific Older Adults Cutoff Score: <=22/30 = risk of falls Parkinsons Disease Cutoff score <15/30= fall risk (Hoehn & Yahr 1-4)  Minimally Clinically Important Difference (MCID)  Stroke (acute, subacute, and chronic) = MDC: 4.2 points Vestibular (acute) = MDC: 6 points Community Dwelling Older Adults =  MCID: 4 points Parkinsons Disease  =  MDC: 4.3 points  (Academy of Neurologic Physical Therapy (nd). Functional Gait Assessment. Retrieved from  https://www.neuropt.org/docs/default-source/cpgs/core-outcome-measures/function-gait-assessment-pocket-guide-proof9-(2).pdf?sfvrsn=b74f35043_0.)    TREATMENT DATE 04/12/2024:     Self Care:  95/63 BP upon start   Physical Performance Testing:  Neck Disability Index:  Score: 56%  Five times Sit to Stand Test (FTSS)  TIME: 16.09 sec  Cut off scores indicative of increased fall risk: >12 sec CVA, >16 sec PD, >13 sec vestibular (ANPTA Core Set of Outcome Measures for Adults with Neurologic Conditions, 2018)  Pt participated in Functional Gait Assessment (FGA) with score of 26/30 demonstrating low fall risk (low fall risk 25-28, medium fall risk 19-24, and high fall risk <19).  10 Meter Walk Test: Patient instructed to walk 10 meters (32.8 ft) as quickly and as safely as possible at their normal speed Results: 1.09 m/s (9.13 seconds without AD)  Cut off scores:   Household Ambulator  < 0.4 m/s  Limited Community Ambulator  0.4 - 0.8 m/s  [employer]  > 0.8 m/s  Increased fall risk  < 1.52m/s  Crossing a Street  >1.47m/s  MCID 0.05 m/s (  small), 0.13 m/s (moderate), 0.06 m/s (significant)  (ANPTA Core Set of Outcome Measures for Adults with Neurologic Conditions, 2018)    PT instructed pt in TUG: 9.26 sec ( >13.5 sec indicates increased fall risk)   6 Min Walk Test:  Instructed patient to ambulate as quickly and as safely as possible for 6 minutes using LRAD. Patient was allowed to take standing rest breaks without stopping the test, but if the patient required a sitting rest break the clock would be stopped and the test would be over.  Results: 1336 feet using no AD with supervision. Results indicate that the patient has reduced endurance with ambulation compared to age matched norms.  Age Matched Norms (in meters): 33-69 yo M: 48 F: 62, 60-79 yo M: 21 F: 471, 53-89 yo M: 417 F: 392 MDC: 58.21 meters (190.98 feet) or 50 meters (ANPTA Core Set of Outcome Measures  for Adults with Neurologic Conditions, 2018)     PATIENT EDUCATION:  Education details: Pt progressing so far with cardio and balance activity.     Person educated: Patient Education method: Medical Illustrator Education comprehension: verbalized understanding, returned demonstration, and verbal cues required  HOME EXERCISE PROGRAM: Access Code: [email or web address] URL: [email or web address] Date: 03/12/2024 Prepared by: Lonni Gainer  Exercises - Marching Near Counter  - 1 x daily - 7 x weekly - 2 sets - 10 reps - 3 sec hold - Single leg balance near counter   - 1 x daily - 7 x weekly - 3 sets - 30 second hold - Heel Toe Raises with Counter Support  - 1 x daily - 7 x weekly - 3 sets - 10 reps  ASSESSMENT:  CLINICAL IMPRESSION:   Pt has responded well to the balance training over the past several weeks and has made significant improvement in his goals, meeting several of them and progressing well with all the balance goals.  Pt does still have complications with his neck that has not resolved over the past few weeks and may need to be area of focus moving forward.  Pt has follow-up with MD soon and will be having nerve conduction testing performed soon.  Patient's condition has the potential to improve in response to therapy. Maximum improvement is yet to be obtained. The anticipated improvement is attainable and reasonable in a generally predictable time.   Pt will continue to benefit from skilled therapy to address remaining deficits in order to improve overall QoL and return to PLOF.      OBJECTIVE IMPAIRMENTS: decreased balance, decreased ROM, decreased strength, hypomobility, impaired flexibility, impaired UE functional use, and pain.   ACTIVITY LIMITATIONS: carrying, lifting, standing, bathing, dressing, reach over head, and hygiene/grooming PARTICIPATION LIMITATIONS: cleaning, laundry, shopping, community activity, and yard work PERSONAL FACTORS: Age and 3+  comorbidities: CAD, Depression, are also affecting patient's functional outcome.  REHAB POTENTIAL: Good CLINICAL DECISION MAKING: Stable/uncomplicated EVALUATION COMPLEXITY: Low   GOALS: Goals reviewed with patient? No  SHORT TERM GOALS: Target date: 01/03/2024  Patient will demonstrate undestanding of home exercise plan by performing exercises correctly with evidence of good carry over with min to no verbal or tactile cues .   Baseline: NT 12/25/23: Performing exercises independently  Goal status: ACHIEVED   2.  Patient will demonstrate understanding of how to modify exercises to decrease thoracic and cervical pain exacerbation for improved function and to decrease incidence of pain flare ups.  Baseline: Does not currently have strategy to manage this  Goal status: NOT MET  LONG TERM GOALS: Target date: 03/13/2024  Patient will show a statistically significant improvement in his neck function as evidence by >=7.5 pt decrease in her neck disability index score to better be able to move neck to improve visual field while driving to avoid accidents. Vernel et al, 2009) Baseline: 33/50 (66%); 02/09/24: 66% 03/01/2024: 50% 04/12/24: 28 (56%) Goal status: NOT MET     2.  Patient will increase cervical mobility by  >=5 degrees AROM for all planes of motion (flexion, extension, side bending, and rotation) for improved cervical function and evidence of pain reduction. Baseline: Cervical AROM Flex 30, Ext 30, Rot R/L 40/40, SB R/L 35/35; 02/09/24: Rt rotation 50 degrees, Left rotation: 53 degress  03/01/2024: Flex:  30*   Ext: 20*      Rot L: 32*     Rot R: 30*      SB L: 20*       SB R: 15* Goal status: MET    3.  Patient will improve shoulder and periscapular strength by 1/3 grade MMT (4- to 4) for improved cervical stability and UE function to carry out upper extremity activities like washing his car and gardening without being limited by pain and discomfort.  Baseline: Shoulder Flex R/L  4/4, Shoulder Abd R/L 4/4; 10/31: shoulder flexion Rt: 5/5, left 4+/5; shoulder ABDCT 5/5 bilat  03/01/2024: Shld Flex R: 5 Shld Flex L: 4+ , Shld abd R: 4 +  Shld Abd L: 4 Goal status: MET    4.  Patient will reduce neck and thoracic pain to <=4/10 NRPS while performing UE activities like washing his car and doing yard work as evidence of improved cervical and thoracic function.  Baseline: 10/10 cervical paraspinal and rhomboid pain with UE activity; up to a 9/10 intermittently with activity   03/01/2024: 8/10 NPS UE activity 04/12/24: 8/10 NPS with UE activity Goal status: NOT MET    5. Patient (> 53 years old) will complete five times sit to stand test in < 15 seconds indicating an increased LE strength and improved balance. Baseline: 17.28s 04/12/24: 16.09 sec Goal status: INITIAL  6. Patient will increase Berg Balance score by > 6 points to demonstrate decreased fall risk during functional activities Baseline: 47/56 Goal status: INITIAL  7. Patient will increase 10 meter walk test to >1.62m/s as to improve gait speed for better community ambulation and to reduce fall risk. Baseline: normal pace: 0.26m/s; fast pace: 0.975s 04/12/24: normal pace: 0.97 m/s, 10.33 sec; fast pace 1.09 m/s, 9.13 s Goal status: INITIAL  8. Patient will reduce timed up and go to <10 seconds to reduce fall risk and demonstrate improved transfer/gait ability. Baseline: 11.27s 04/12/24: 9.26 sec Goal status: MET  9. Patient will decrease distance of by 187ft in order to increase cardiovascular endurance for improved community ambulation distance. Baseline: 920ft in 6 min w/ [facility] 04/12/24: 1336' in 6 min no AD Goal status: MET  10. Pt will increase score on FGA by 4 points in order to increase functional ambulation and increase confidence in community environments. Baseline: 17/30 04/12/24: 26/30 Goal status: INITIAL   PLAN:  PT FREQUENCY: 1-2x/week PT DURATION: 8 weeks PLANNED INTERVENTIONS: [id number]- PT  Re-evaluation, [id number]- Physical Performance Testing, 97110-Therapeutic exercises, 97530- Therapeutic activity, 97112- Neuromuscular re-education, 97535- Self Care, [id number]- Manual therapy, [id number]- Gait training, [phone]- Canalith repositioning, [id number]- Aquatic Therapy, [id number]- Electrical stimulation (unattended), [id number]- Electrical stimulation (manual), [id number]- Traction (mechanical), [id number] (1-2 muscles), [id number] (3+ muscles)- Dry Needling, Patient/Family education, Balance  training, Joint mobilization, Joint manipulation, Spinal manipulation, Spinal mobilization, Vestibular training, DME instructions, Cryotherapy, and Moist heat  PLAN FOR NEXT SESSION:    -continue to address cervical ROM/pain -address global weakness, deconditioning, and imbalance.     Fonda Simpers, PT, DPT Physical Therapist - [facility]  04/12/2024, 10:07 AM   "

## 2024-04-16 ENCOUNTER — Ambulatory Visit: Payer: Medicare Other | Admitting: Family Medicine

## 2024-04-16 ENCOUNTER — Ambulatory Visit

## 2024-04-16 ENCOUNTER — Encounter: Admitting: Speech Pathology

## 2024-04-16 VITALS — BP 100/62 | HR 68 | Temp 98.0°F | Ht 68.75 in | Wt 117.6 lb

## 2024-04-16 DIAGNOSIS — F411 Generalized anxiety disorder: Secondary | ICD-10-CM

## 2024-04-16 DIAGNOSIS — Z95811 Presence of heart assist device: Secondary | ICD-10-CM | POA: Insufficient documentation

## 2024-04-16 DIAGNOSIS — E782 Mixed hyperlipidemia: Secondary | ICD-10-CM | POA: Diagnosis not present

## 2024-04-16 DIAGNOSIS — E440 Moderate protein-calorie malnutrition: Secondary | ICD-10-CM | POA: Diagnosis not present

## 2024-04-16 DIAGNOSIS — J849 Interstitial pulmonary disease, unspecified: Secondary | ICD-10-CM | POA: Insufficient documentation

## 2024-04-16 DIAGNOSIS — N02B9 Other recurrent and persistent immunoglobulin A nephropathy: Secondary | ICD-10-CM | POA: Diagnosis not present

## 2024-04-16 DIAGNOSIS — N1832 Chronic kidney disease, stage 3b: Secondary | ICD-10-CM | POA: Diagnosis not present

## 2024-04-16 DIAGNOSIS — I951 Orthostatic hypotension: Secondary | ICD-10-CM | POA: Diagnosis not present

## 2024-04-16 DIAGNOSIS — Z931 Gastrostomy status: Secondary | ICD-10-CM | POA: Diagnosis not present

## 2024-04-16 DIAGNOSIS — E039 Hypothyroidism, unspecified: Secondary | ICD-10-CM | POA: Diagnosis not present

## 2024-04-16 DIAGNOSIS — F331 Major depressive disorder, recurrent, moderate: Secondary | ICD-10-CM

## 2024-04-16 DIAGNOSIS — I6521 Occlusion and stenosis of right carotid artery: Secondary | ICD-10-CM | POA: Diagnosis not present

## 2024-04-16 DIAGNOSIS — E43 Unspecified severe protein-calorie malnutrition: Secondary | ICD-10-CM

## 2024-04-16 MED ORDER — DULOXETINE HCL 60 MG PO CPEP: 120.0000 mg | ORAL_CAPSULE | Freq: Every day | ORAL | Status: AC

## 2024-04-16 NOTE — Assessment & Plan Note (Signed)
"   Unfortunately poor weight gain even with increase calories in last 1-2 weeks with new formula. "

## 2024-04-16 NOTE — Assessment & Plan Note (Signed)
Chronic, poor control. 

## 2024-04-16 NOTE — Assessment & Plan Note (Signed)
"   Unfortunately has not gain much weight.  HAs now been on higher calorie feed for  "

## 2024-04-16 NOTE — Telephone Encounter (Signed)
 Will talk about it at todays OV.

## 2024-04-16 NOTE — Assessment & Plan Note (Addendum)
 Chronic,  good control  No longer on atorvastatin  40 mg p.o. daily given increased LFTs and on tube feeds.

## 2024-04-16 NOTE — Assessment & Plan Note (Addendum)
 Chronic, followed by cardiology.  Now on midodrine  10 mg 3 times a day.  Off finasteride   Has increased free water  to recommended amount per renal.  BP somewhat improved in office today

## 2024-04-16 NOTE — Assessment & Plan Note (Signed)
 Followed by cardiology

## 2024-04-16 NOTE — Assessment & Plan Note (Signed)
Stable, chronic.  Continue current medication.   Levo 88 mcg daily

## 2024-04-16 NOTE — Assessment & Plan Note (Signed)
 High risk for complications.

## 2024-04-16 NOTE — Progress Notes (Signed)
 "   Patient ID: Shawn Lam, male    DOB: 1951/03/10, 74 y.o.   MRN: [id number]  This visit was conducted in person.  BP 100/62   Pulse 68   Temp 98 F (36.7 C) (Temporal)   Ht 5' 8.75 (1.746 m)   Wt 117 lb 9 oz (53.3 kg)   SpO2 96%   BMI 17.49 kg/m    CC:  Chief Complaint  Patient presents with   Annual Exam    Part 2 MWV 03/27/24    Subjective:   HPI: Shawn Lam is a 74 y.o. male presenting on 04/16/2024   The patient presents for  review of chronic health problems. He/She also has the following acute concerns today:     Since neck surgery 2024 having dysphagia/chronic aspiration and abnormal weight loss. Now with Gtube for nutrition ( has been on high calorie for 2 weeks)... poor weight gain. Inadequate control of mood.. but  much better in last week after increasing cymbalta  dose.   Has appt with ENT 1/26/202... for evaluation to see if aspiraton can be controlled. Wt Readings from Last 3 Encounters:  04/16/24 117 lb 9 oz (53.3 kg)  03/29/24 119 lb 6 oz (54.1 kg)  03/27/24 119 lb 12.8 oz (54.3 kg)    The patient saw a LPN or RN for medicare wellness visit. 03/27/2024  Prevention and wellness was reviewed in detail. Note reviewed and important notes copied below.   Sees Neuro for chronic headache and dizziness  Dr. Maree.   Continued constipation.. has been treating with dulcolax.   OSA: Followed by pulmonary     Chronic insomnia: Stable control on Ambien ..10 mg nightly.     IG A nephropathy/CKD: Followed by Dr.  Lateef q 6 month OV.   Orthostatic hypotension...   Increase free water  recently and stopped finasteride , but only 3 days ago.  On midodrine  TID BP Readings from Last 3 Encounters:  04/16/24 100/62  03/29/24 (!) 82/56  03/28/24 (!) 96/50   CAD: followed by Dr. Gollan cardiology.    Hypothyroid  Slightly low T3, nml T4, elevated TSH  on levothyroxine  88 mcg daily Lab Results  Component Value Date   TSH 1.25 07/12/2023     Elevated Cholesterol: Good control at goal LDL < 70  No longer on atorvastatin  40 mg daily given G tube Lab Results  Component Value Date   CHOL 139 02/26/2024   HDL 64.10 02/26/2024   LDLCALC 61 02/26/2024   LDLDIRECT 57 06/22/2009   TRIG 72.0 02/26/2024   CHOLHDL 2 02/26/2024  Using medications without problems: Muscle aches: none Diet compliance:  g tube Exercise: daily  walking Other complaints:   MDD, GAD chronic insomnia : poor control on max cymbalta , Buspar  taking at 20 mg Three times daily ( max dose) Off wellbutrin  as was not helping much. Interested in changing cymbalta  to sertraline .  He is not interested in referral to counseling or return to psychiatry.  Peripheral neuropathy   no longer on gabapentin      BPH followed by Dr. Alvaro. Stable on tamsulosin  and finasteride    Patient Care Team: Avelina Greig BRAVO, MD as PCP - General Gollan, Evalene PARAS, MD as PCP - Cardiology (Cardiology) Fate Morna SAILOR, [facility] (Inactive) as Pharmacist (Pharmacist) Laurice Francis NOVAK, [place] (Optometry) Onetha Kuba, MD as Consulting Physician (Neurosurgery)   Pulmonary Dr. Lenda  Relevant past medical, surgical, family and social history reviewed and updated as indicated. Interim medical history since our last visit  reviewed. Allergies and medications reviewed and updated. Outpatient Medications Prior to Visit  Medication Sig Dispense Refill   aspirin  81 MG chewable tablet Place 1 tablet (81 mg total) into feeding tube daily.     busPIRone  (BUSPAR ) 10 MG tablet Place 2 tablets (20 mg total) into feeding tube 3 (three) times daily.     Cholecalciferol  (VITAMIN D3) 50 MCG (2000 UT) TABS Place 1 tablet (2,000 Units total) into feeding tube daily.     clopidogrel  (PLAVIX ) 75 MG tablet Place 1 tablet (75 mg total) into feeding tube daily.     denosumab  (PROLIA ) 60 MG/ML SOSY injection Inject 60 mg into the skin every 6 (six) months. Do not send RX, order this from the office      Erenumab-aooe (AIMOVIG) 140 MG/ML SOAJ 140 mg every 28 (twenty-eight) days.     levothyroxine  (SYNTHROID ) 88 MCG tablet Place 1 tablet (88 mcg total) into feeding tube daily before breakfast.     midodrine  (PROAMATINE ) 5 MG tablet Take 1 tablet (5 mg total) by mouth 3 (three) times daily as needed. For Systolic Blood Pressure (Top number) of less than 115 270 tablet 3   nitroGLYCERIN  (NITROSTAT ) 0.4 MG SL tablet Place 1 tablet (0.4 mg total) under the tongue every 5 (five) minutes as needed for chest pain. 25 tablet 3   Nutritional Supplements (FEEDING SUPPLEMENT, OSMOLITE 1.5 CAL,) LIQD Place 120 mLs into feeding tube 6 (six) times daily.     triamcinolone  cream (KENALOG ) 0.1 % APPLY 2 TOPICALLY DAILY , DO NOT APPLY ON DACE , GROIN AND UNDERARMS. 454 g 0   Water  For Irrigation, Sterile (FREE WATER ) SOLN Place 100 mLs into feeding tube 6 (six) times daily.     zolpidem  (AMBIEN ) 10 MG tablet TAKE 1 TABLET BY MOUTH EVERY NIGHT AT BEDTIME AS NEEDED FOR SLEEP 30 tablet 1   DULoxetine  (CYMBALTA ) 30 MG capsule TAKE 3 CAPSULES BY MOUTH DAILY 90 capsule 4   finasteride  (PROSCAR ) 5 MG tablet Take 5 mg by mouth daily. (Patient not taking: Reported on 04/16/2024)     No facility-administered medications prior to visit.     Per HPI unless specifically indicated in ROS section below Review of Systems  Constitutional:  Negative for fatigue and fever.  HENT:  Negative for ear pain.   Eyes:  Negative for pain.  Respiratory:  Negative for cough and shortness of breath.   Cardiovascular:  Negative for chest pain, palpitations and leg swelling.  Gastrointestinal:  Negative for abdominal pain.  Genitourinary:  Negative for dysuria.  Musculoskeletal:  Negative for arthralgias.  Neurological:  Negative for syncope, light-headedness and headaches.  Psychiatric/Behavioral:  Negative for dysphoric mood.    Objective:  BP 100/62   Pulse 68   Temp 98 F (36.7 C) (Temporal)   Ht 5' 8.75 (1.746 m)   Wt  117 lb 9 oz (53.3 kg)   SpO2 96%   BMI 17.49 kg/m   Wt Readings from Last 3 Encounters:  04/16/24 117 lb 9 oz (53.3 kg)  03/29/24 119 lb 6 oz (54.1 kg)  03/27/24 119 lb 12.8 oz (54.3 kg)      Physical Exam Constitutional:      Appearance: He is well-developed.  HENT:     Head: Normocephalic.     Right Ear: Hearing normal.     Left Ear: Hearing normal.     Nose: Nose normal.  Neck:     Thyroid : No thyroid  mass or thyromegaly.     Vascular:  No carotid bruit.     Trachea: Trachea normal.  Cardiovascular:     Rate and Rhythm: Normal rate and regular rhythm.     Pulses: Normal pulses.     Heart sounds: Heart sounds not distant. No murmur heard.    No friction rub. No gallop.     Comments: No peripheral edema Pulmonary:     Effort: Pulmonary effort is normal. No respiratory distress.     Breath sounds: Normal breath sounds.  Musculoskeletal:     Comments: Ttp over rigth trapezius, negative Spurling, decrease ROM at neck  Skin:    General: Skin is warm and dry.     Findings: No rash.  Psychiatric:        Speech: Speech normal.        Behavior: Behavior normal.        Thought Content: Thought content normal.       Results for orders placed or performed in visit on 04/09/24  Phosphorus   Collection Time: 04/09/24  8:00 AM  Result Value Ref Range   Phosphorus 3.8 2.3 - 4.6 mg/dL  Magnesium    Collection Time: 04/09/24  8:00 AM  Result Value Ref Range   Magnesium  2.0 1.5 - 2.5 mg/dL  Comprehensive metabolic panel with GFR   Collection Time: 04/09/24  8:00 AM  Result Value Ref Range   Sodium 137 135 - 145 mEq/L   Potassium 4.1 3.5 - 5.1 mEq/L   Chloride 100 96 - 112 mEq/L   CO2 29 19 - 32 mEq/L   Glucose, Bld 135 (H) 70 - 99 mg/dL   BUN 32 (H) 6 - 23 mg/dL   Creatinine, Ser 8.30 (H) 0.40 - 1.50 mg/dL   Total Bilirubin 0.6 0.2 - 1.2 mg/dL   Alkaline Phosphatase 67 39 - 117 U/L   AST 43 (H) 5 - 37 U/L   ALT 36 3 - 53 U/L   Total Protein 6.4 6.0 - 8.3 g/dL   Albumin  3.8 3.5 - 5.2 g/dL   GFR 60.29 (L) >39.99 mL/min   Calcium  9.6 8.4 - 10.5 mg/dL   *Note: Due to a large number of results and/or encounters for the requested time period, some results have not been displayed. A complete set of results can be found in Results Review.     COVID 19 screen:  No recent travel or known exposure to COVID19 The patient denies respiratory symptoms of COVID 19 at this time. The importance of social distancing was discussed today.   Assessment and Plan The patient's preventative maintenance and recommended screening tests for an annual wellness exam were reviewed in full today. Brought up to date unless services declined.  Counselled on the importance of diet, exercise, and its role in overall health and mortality. The patient's FH and SH was reviewed, including their home life, tobacco status, and drug and alcohol status.   Vaccines: Uptodate flu  and tdap  S/P COVID x 3, completed shingrix and COVID booster. Nonsmoker    Prostate cancer screening no longer indicated. Colon cancer screening: Colonoscopy 10/19/2021 nml, repeat in 5 years. Hep C: done  HIV: neg test  Osteoporosis:  declining over last 2 years on fosamax ..  changed to Prolia ,  started 2022.SABRA Last DEXA 05/2022 osteopenia, plan recheck next year.   Problem List Items Addressed This Visit     Carotid stenosis    Stable 11/2023      Chronic kidney disease, stage 3b (HCC)   Chronic respiratory failure with  hypoxia, on home oxygen  therapy (HCC)   Gastrointestinal tube present (HCC)    In place given malnutrition and aspiration.    Wt Readings from Last 3 Encounters:  03/29/24 119 lb 6 oz (54.1 kg)  03/27/24 119 lb 12.8 oz (54.3 kg)  03/21/24 120 lb 3.2 oz (54.5 kg)         Generalized anxiety disorder (Chronic)    Chronic poor control.  Stop cymbalta  and change to sertraline  50 mg x 1 week then increase to 100 mg daily.  Continue Ambien  and buspar       Relevant Medications    DULoxetine  (CYMBALTA ) 60 MG capsule   HLD (hyperlipidemia) - Primary (Chronic)   Chronic,  good control  No longer on atorvastatin  40 mg p.o. daily given increased LFTs and on tube feeds.      Hypothyroidism (Chronic)   Stable, chronic.  Continue current medication. Levo 88 mcg daily        IgA nephropathy (Chronic)   Chronic, followed by renal.    Now on renal feed per G tube, increase free water .      Interstitial pulmonary disease, unspecified (HCC)   Followed by pulmonary.      MDD (major depressive disorder), recurrent episode, moderate (HCC)    Chronic , poor control.      Relevant Medications   DULoxetine  (CYMBALTA ) 60 MG capsule   Moderate protein-calorie malnutrition (weight for age 66-74% of standard)    Unfortunately poor weight gain even with increase calories in last 1-2 weeks with new formula.      Orthostatic hypotension   Chronic, followed by cardiology.  Now on midodrine  10 mg 3 times a day.  Off finasteride   Has increased free water  to recommended amount per renal.  BP somewhat improved in office today      Presence of heart assist device (HCC)    Followed by cardiology      Protein-calorie malnutrition, severe    Unfortunately has not gain much weight.  HAs now been on higher calorie feed for           Greig Ring, MD   "

## 2024-04-16 NOTE — Assessment & Plan Note (Signed)
"   Chronic poor control.  Stop cymbalta  and change to sertraline  50 mg x 1 week then increase to 100 mg daily.  Continue Ambien  and buspar  "

## 2024-04-16 NOTE — Assessment & Plan Note (Signed)
 Followed by pulmonary

## 2024-04-16 NOTE — Assessment & Plan Note (Signed)
"   Stable 11/2023 "

## 2024-04-16 NOTE — Assessment & Plan Note (Signed)
 Chronic, followed by renal.    Now on renal feed per G tube, increase free water .

## 2024-04-16 NOTE — Assessment & Plan Note (Signed)
"   In place given malnutrition and aspiration.    Wt Readings from Last 3 Encounters:  03/29/24 119 lb 6 oz (54.1 kg)  03/27/24 119 lb 12.8 oz (54.3 kg)  03/21/24 120 lb 3.2 oz (54.5 kg)    "

## 2024-04-17 ENCOUNTER — Ambulatory Visit: Admitting: Speech Pathology

## 2024-04-17 ENCOUNTER — Ambulatory Visit: Admitting: Physical Therapy

## 2024-04-17 ENCOUNTER — Other Ambulatory Visit: Payer: Self-pay | Admitting: Family Medicine

## 2024-04-17 DIAGNOSIS — R2689 Other abnormalities of gait and mobility: Secondary | ICD-10-CM

## 2024-04-17 DIAGNOSIS — M6281 Muscle weakness (generalized): Secondary | ICD-10-CM

## 2024-04-17 DIAGNOSIS — R269 Unspecified abnormalities of gait and mobility: Secondary | ICD-10-CM

## 2024-04-17 DIAGNOSIS — R1310 Dysphagia, unspecified: Secondary | ICD-10-CM

## 2024-04-17 DIAGNOSIS — E440 Moderate protein-calorie malnutrition: Secondary | ICD-10-CM

## 2024-04-17 DIAGNOSIS — T17908D Unspecified foreign body in respiratory tract, part unspecified causing other injury, subsequent encounter: Secondary | ICD-10-CM

## 2024-04-17 DIAGNOSIS — R1312 Dysphagia, oropharyngeal phase: Secondary | ICD-10-CM

## 2024-04-17 DIAGNOSIS — R633 Feeding difficulties, unspecified: Secondary | ICD-10-CM

## 2024-04-17 NOTE — Therapy (Signed)
 " OUTPATIENT PHYSICAL THERAPY TREATMENT   Patient Name: Shawn Lam MRN: [id number] DOB:[birth year], 74 y.o., male Today's Date: 04/17/2024  END OF SESSION:  PT End of Session - 04/17/24 0748     Visit Number 21    Number of Visits 27    Date for Recertification  04/30/24    Progress Note Due on Visit 30    Equipment Utilized During Treatment Gait belt    Activity Tolerance Patient tolerated treatment well    Behavior During Therapy [facility] for tasks assessed/performed         Past Medical History:  Diagnosis Date   Actinic keratosis 02/04/2021   right lateral neck   Anemia    Anxiety    Asymptomatic LV dysfunction    50-55% by echo 06/2016   BCC (basal cell carcinoma of skin) 08/05/2020   left neck infra auricular - nodular pattern - treated with [facility] 09/17/2020   Berger's disease    Blood transfusion without reported diagnosis 04/11/2018   CAD (coronary artery disease) 06/2005   PCI of LAD and RCA   Cancer (HCC)    Cataract 04/11/2016   04/11/2016   Depression    Dilated aortic root    4cm at sinus of Valsalva   Hematuria    Hyperlipidemia    Hypothyroidism    Insomnia    Left ureteral stone    Orthostatic hypotension    negative tilt table   OSA (obstructive sleep apnea)    moderate with AHI 17/hr, uses CPAP nightly(01/15/19 - has Inspire implant)   Osteoporosis 04/11/2016   PVC's (premature ventricular contractions) 07/07/2016   Renal disorder    PT IS SEEING DR. BETSEY- NEPHROLOGIST   Rosacea    Sleep apnea 04/11/2016   04/11/2016   Ulcer 04/11/2018   Urticaria    Past Surgical History:  Procedure Laterality Date   ANTERIOR CERVICAL DECOMP/DISCECTOMY FUSION N/A 03/13/2023   Procedure: CERVICAL FOUR-FIVE, CERVICAL FIVE-SIX ANTERIOR CERVICAL DISCECTOMY/DECOMPRESSION FUSION;  Surgeon: Onetha Kuba, MD;  Location: [facility] OR;  Service: Neurosurgery;  Laterality: N/A;  3C   CARDIAC CATHETERIZATION  09/06/2013   [facility]   CARDIAC CATHETERIZATION     COLONOSCOPY WITH  PROPOFOL  N/A 01/21/2019   Procedure: COLONOSCOPY WITH PROPOFOL ;  Surgeon: Jinny Carmine, MD;  Location: [facility];  Service: Endoscopy;  Laterality: N/A;  sleep apnea   COLONOSCOPY WITH PROPOFOL  N/A 10/19/2021   Procedure: COLONOSCOPY WITH PROPOFOL ;  Surgeon: Jinny Carmine, MD;  Location: [facility] ENDOSCOPY;  Service: Endoscopy;  Laterality: N/A;   CORONARY ANGIOPLASTY  2004   STENT PLACEMENT   CYSTOSCOPY WITH RETROGRADE PYELOGRAM, URETEROSCOPY AND STENT PLACEMENT Left 03/19/2014   Procedure: CYSTOSCOPY WITH RETROGRADE PYELOGRAM, URETEROSCOPY AND STENT PLACEMENT;  Surgeon: Mickey Ricardo KATHEE Alvaro, MD;  Location: [facility] ORS;  Service: Urology;  Laterality: Left;   CYSTOSCOPY WITH RETROGRADE PYELOGRAM, URETEROSCOPY AND STENT PLACEMENT Bilateral 05/20/2015   Procedure:  CYSTOSCOPY WITH BILATERAL RETROGRADE PYELOGRAM,RIGHT  DIAGNOSTIC URETEROSCOPY ,LEFT URETEROSCOPY WITH HOLMIUM LASER  AND BILATERAL  STENT PLACEMENT ;  Surgeon: Ricardo Alvaro, MD;  Location: [facility];  Service: Urology;  Laterality: Bilateral;   DRUG INDUCED ENDOSCOPY N/A 10/20/2017   Procedure: DRUG INDUCED SLEEP ENDOSCOPY;  Surgeon: Carlie Clark, MD;  Location: [facility];  Service: ENT;  Laterality: N/A;   EP IMPLANTABLE DEVICE N/A 05/13/2016   Procedure: Loop Recorder Insertion;  Surgeon: Elspeth JAYSON Sage, MD;  Location: [facility] INVAS[facility] CV LAB;  Service: Cardiovascular;  Laterality: N/A;   ESOPHAGOGASTRODUODENOSCOPY N/A 10/19/2021  Procedure: ESOPHAGOGASTRODUODENOSCOPY (EGD);  Surgeon: Jinny Carmine, MD;  Location: [facility] ENDOSCOPY;  Service: Endoscopy;  Laterality: N/A;   ESOPHAGOGASTRODUODENOSCOPY (EGD) WITH PROPOFOL  N/A 11/30/2018   Procedure: ESOPHAGOGASTRODUODENOSCOPY (EGD) WITH PROPOFOL ;  Surgeon: Therisa Bi, MD;  Location: [facility] ENDOSCOPY;  Service: Gastroenterology;  Laterality: N/A;   ESOPHAGOGASTRODUODENOSCOPY (EGD) WITH PROPOFOL  N/A 12/01/2018   Procedure: ESOPHAGOGASTRODUODENOSCOPY (EGD) WITH PROPOFOL ;  Surgeon:  Jinny Carmine, MD;  Location: [facility] ENDOSCOPY;  Service: Endoscopy;  Laterality: N/A;   HOLMIUM LASER APPLICATION Bilateral 05/20/2015   Procedure: HOLMIUM LASER APPLICATION;  Surgeon: Ricardo Likens, MD;  Location: [facility];  Service: Urology;  Laterality: Bilateral;   IMPLIMENTATION OF A HYPOGLOSSAL NERVE STIMULATOR  12/08/2017   OSA    IR GASTROSTOMY TUBE MOD SED  01/18/2024   IR RADIOLOGIST EVAL & MGMT  02/13/2024   LEFT HEART CATHETERIZATION WITH CORONARY ANGIOGRAM N/A 09/06/2013   Procedure: LEFT HEART CATHETERIZATION WITH CORONARY ANGIOGRAM;  Surgeon: Victory LELON Claudene DOUGLAS, MD;  Location: [facility] CATH LAB;  Service: Cardiovascular;  Laterality: N/A;   LEFT KNEE CAP  SURGERY ABOUT 40 YRS AGO  ? 1970     SPINE SURGERY  Desk in neck   STONE EXTRACTION WITH BASKET Left 03/19/2014   Procedure: STONE EXTRACTION WITH BASKET;  Surgeon: Mickey Ricardo KATHEE Likens, MD;  Location: [facility];  Service: Urology;  Laterality: Left;   TRANSURETHRAL RESECTION OF PROSTATE N/A 08/25/2021   Procedure: TRANSURETHRAL RESECTION OF THE PROSTATE (TURP);  Surgeon: Likens Ricardo, MD;  Location: [facility];  Service: Urology;  Laterality: N/A;  1 HR   Patient Active Problem List   Diagnosis Date Noted   Presence of heart assist device (HCC) 04/16/2024   Interstitial pulmonary disease, unspecified (HCC) 04/16/2024   Abnormal PET scan of lung 03/29/2024   Gastrointestinal tube present (HCC) 02/18/2024   Granulation tissue of site of gastrostomy 02/06/2024   Protein-calorie malnutrition, severe 01/23/2024   Multifocal pneumonia 01/22/2024   Chronic pulmonary aspiration 12/29/2023   Silent aspiration 12/29/2023   Moderate protein-calorie malnutrition (weight for age 20-74% of standard) 12/29/2023   Bilateral leg weakness 12/29/2023   Chronic constipation 12/29/2023   Somnolence 12/29/2023   Frequent falls 12/29/2023   Feeding difficulties, unspecified 12/20/2023   Shortness of breath 12/20/2023   Abnormal CT of the chest  10/10/2023   Carotid stenosis 05/11/2023   Spinal stenosis in cervical region 03/13/2023   Osteoarthritis of spine with radiculopathy, cervical region 07/14/2022   Cervicogenic headache 11/30/2021   Chronic neck pain 11/30/2021   Weight loss, non-intentional    Dysphagia    Polyp of colon    Prostatic hyperplasia 08/25/2021   Chronic insomnia 03/19/2020   Peripheral neuropathy 03/19/2020   Iron deficiency anemia 01/06/2020   Allergic dermatitis 07/08/2019   Osteoarthritis of left knee 02/26/2019   History of colonic polyps    History of duodenal ulcer    Chronic kidney disease, stage 3b (HCC)    Bilateral chronic knee pain 11/09/2018   Epistaxis 10/02/2018   Subjective tinnitus, bilateral 10/02/2018   Chronic pain of right thumb 02/22/2018   Acute bilateral thoracic back pain 01/23/2018   Osteoporosis without current pathological fracture 02/10/2017   Hypothyroidism 11/02/2016   Thoracic compression fracture (HCC) 09/06/2016   PVC's (premature ventricular contractions) 07/07/2016   Hypoalbuminemia 06/28/2016   IgA nephropathy 06/07/2016   Acute buttock pain 05/23/2016   MDD (major depressive disorder), recurrent episode, moderate (HCC) 11/20/2015   OSA (obstructive sleep apnea) 02/05/2014   Orthostatic hypotension 02/07/2013   Scotoma  involving central area 01/29/2013   Optic neuropathy 01/28/2013   HEMATURIA UNSPECIFIED 01/21/2010   HLD (hyperlipidemia) 07/05/2006   Generalized anxiety disorder 07/05/2006   CAD (coronary artery disease) 07/05/2006   GERD 07/05/2006    PCP: Dr. Greig Ring    REFERRING PROVIDER: Dr. Arley Helling   REFERRING DIAG: M54.6 (ICD-10-CM) - Acute bilateral thoracic back pain  THERAPY DIAG:  Abnormality of gait  Balance disorder  Muscle weakness (generalized)  Rationale for Evaluation and Treatment: Rehabilitation  ONSET DATE: May 2025    SUBJECTIVE:                                                                                                                                                                                                          SUBJECTIVE STATEMENT  Pt denies any falls since the last visit.  Pt also notes his BP has been better, staying around 110 for the systolic pressure.  PERTINENT HISTORY:   73yoM referred to physical therapy for ongoing neck pain following cervical fusion in May 2020 and newly developed thoracic pain from T12 compression fracture that occurred sometime in Spring of 2025 (associated radicular pain pattern). Pt has pain localized to upper traps and suboccipitals. Pt attempted DC of prednisone  and gabapentin  two weeks prior to evaluation here, severe decline in function due to pain, barely able to walk for two weeks. He describes feeling increased fatigue and weakness to the point where he struggled to walk. Pt also reported repeated falls in the setting of orthostatic hypotension with him experiencing syncope when he stands up from a seated position. He describes feeling a burning sensation that is also localized to his scapulae and that it occurs when he is doing UE activities like washing his car that is likely the radicular pain from thoracic vertebrae compression fracture.  PAIN: Are you having pain? 03/05/2024: 6/10 today in the neck  PRECAUTIONS: None Pt has thoracic compression fractures and is s/p cervical spine surgery   WEIGHT BEARING RESTRICTIONS: No  FALLS:   Has patient fallen in last 6 months? Yes. Number of falls 6, pt reports that falls are happening in the context of syncope when standing up from a seated position (Orthostatic Hypotension) , where he blacks out and falls. He is following up with his PCP, Dr. Karlene who is working with him on stabilizing his blood pressure and she has recently taken him off Gabapentin  to see if this helps. Pt reports that he does note an improvement in his symptoms with less light headedness since going off medication.  LIVING  ENVIRONMENT: Lives with: lives with their spouse Lives in: House/apartment Stairs: No Has following equipment at home: Single point cane and Environmental Consultant - 4 wheeled  OCCUPATION: Retired     PLOF: Independent  PATIENT GOALS: Improve balance, decrease falls.       OBJECTIVE:   Note: Objective measures were completed at evaluation unless otherwise noted. Reassessment Measures 02/09/24: -eyes closed balance, normal stance: <5sec with retropulsion each time, ModA falls recovery  -narrow stance eyes open balance: <10sec with LOB, ModA falls recovery  -Long distance AMB assessment c SPC: 125ft with 2 DOE standing breaks ~15 seconds each, 15sec; crossover gait intermittently without LOB; -standing vitals taken at end of walk: stable and comparable to prior seated vitals.  -cervical rotation ROM: 50deg Rt, 53 degree left  -shoulder flexion MMT: 5/5 Rt, 4+/5 left; shoulder ABDCT MMT: 5/5 bilat  -ankle DF assessment: can perform >10x full range while leaning against wall (adequate for gait needs)  -sensory testing about ankles, lower legs: (feels intact, but pt reports constance N/T below ankle to his toes)  Standing BP assessment: 02/28/24 -124/59mmHg 95bpm standing @ 0 -122/9mmHg 90bpm standing @ 1 110/34mmHg 92bpm standing @ 2  FUNCTIONAL GAIT ASSESSMENT  Date: 04/12/2024  Score  GAIT LEVEL SURFACE Instructions: Walk at your normal speed from here to the next mark (6 m) [20 ft]. (3) Normal - Walks 6 m (20 ft) in less than 5.5 seconds, no assistive devices, good speed, no evidence for imbalance, normal gait pattern, deviates no more than 15.24 cm (6 in) outside of the 30.48-cm (12-in) walkway width.  2.   CHANGE IN GAIT SPEED Instructions: Begin walking at your normal pace (for 1.5 m [5 ft]). When I tell you go, walk as fast as you can (for 1.5 m [5 ft]). When I tell you slow, walk as slowly as you can (for 1.5 m [5 ft]. (3) Normal - Able to smoothly change walking speed without loss of  balance or gait deviation. Shows a significant difference in walking speeds between normal, fast, and slow speeds. Deviates no more than 15.24 cm (6 in) outside of the 30.48-cm (12-in) walkway width.  3.    GAIT WITH HORIZONTAL HEAD TURNS Instructions: Walk from here to the next mark 6 m (20 ft) away. Begin walking at your normal pace. Keep walking straight; after 3 steps, turn your head to the right and keep walking straight while looking to the right. After 3 more steps, turn your head to the left and keep walking straight while looking left. Continue alternating looking right and left. (3) Normal - Performs head turns smoothly with no change in gait. Deviates no more than 15.24 cm (6 in) outside 30.48-cm (12-in) walkway width.  4.   GAIT WITH VERTICAL HEAD TURNS Instructions: Walk from here to the next mark (6 m [20 ft]). Begin walking at your normal pace. Keep walking straight; after 3 steps, tip your head up and keep walking straight while looking up. After 3 more steps, tip your head down, keep walking straight while looking down. Continue  alternating looking up and down every 3 steps until you have completed 2 repetitions in each direction. (3) Normal - Performs head turns with no change in gait. Deviates no more than 15.24 cm (6 in) outside 30.48-cm (12-in) walkway width.  5.  GAIT AND PIVOT TURN Instructions: Begin with walking at your normal pace. When I tell you, turn and stop, turn as quickly as you can to face  the opposite direction and stop. (3) Normal - Pivot turns safely within 3 seconds and stops quickly with no loss of balance  6.   STEP OVER OBSTACLE Instructions: Begin walking at your normal speed. When you come to the shoe box, step over it, not around it, and keep walking. (3) Normal - Is able to step over 2 stacked shoe boxes taped together (22.86 cm [9 in] total height) without changing gait speed; no evidence of imbalance.  7.   GAIT WITH NARROW BASE OF SUPPORT Instructions:  Walk on the floor with arms folded across the chest, feet aligned heel to toe in tandem for a distance of 3.6 m [12 ft]. The number of steps taken in a straight line are counted for a maximum of 10 steps. (3) Normal - Is able to ambulate for 10 steps heel to toe with no staggering.  8.   GAIT WITH EYES CLOSED Instructions: Walk at your normal speed from here to the next mark (6 m [20 ft]) with your eyes closed. (0) Severe impairment - Cannot walk 6 m (20 ft) without assistance, severe gait deviations or imbalance, deviates greater than 38.1 cm (15 in) outside 30.48-cm (12-in) walkway width or will not attempt task.  9.   AMBULATING BACKWARDS Instructions: Walk backwards until I tell you to stop (3) Normal - Walks 6 m (20 ft), no assistive devices, good speed, no evidence for imbalance, normal gait pattern, deviates no more than 15.24 cm (6 in) outside 30.48-cm (12-in) walkway width.  10. STEPS Instructions: Walk up these stairs as you would at home (ie, using the rail if necessary). At the top turn around and walk down. (2) Mild impairment-Alternating feet, must use rail.  Total 26/30   Interpretation of scores: Non-Specific Older Adults Cutoff Score: <=22/30 = risk of falls Parkinsons Disease Cutoff score <15/30= fall risk (Hoehn & Yahr 1-4)  Minimally Clinically Important Difference (MCID)  Stroke (acute, subacute, and chronic) = MDC: 4.2 points Vestibular (acute) = MDC: 6 points Community Dwelling Older Adults =  MCID: 4 points Parkinsons Disease  =  MDC: 4.3 points  (Academy of Neurologic Physical Therapy (nd). Functional Gait Assessment. Retrieved from https://www.neuropt.org/docs/default-source/cpgs/core-outcome-measures/function-gait-assessment-pocket-guide-proof9-(2).pdf?sfvrsn=b82f35043_0.)    TREATMENT DATE 04/17/2024:     Self Care:  97/68 BP upon start  TA- To improve functional movements patterns for everyday tasks   Nustep level 4-8 rolling hills x 8 min for reciprocal UE  movement training and endurance   Sit to stand 2 x 10 - added to HEP   Marching with min UE support with 3 sec holds   SLS 2 x 30 sec ea   Heel to raises 2 x 15 ea alternating between the two   NMR: To facilitate reeducation of movement, balance, posture, coordination, and/or proprioception/kinesthetic sense.  Walk across airex beam to airex pad -> 180 degree turn on pad and repeat x 10    PATIENT EDUCATION:  Education details: Pt progressing so far with cardio and balance activity.     Person educated: Patient Education method: Medical Illustrator Education comprehension: verbalized understanding, returned demonstration, and verbal cues required  HOME EXERCISE PROGRAM: Access Code: [email or web address] URL: [email or web address] Date: 04/17/2024 Prepared by: Lonni Gainer  Exercises - Marching Near Counter  - 1 x daily - 3-5 x weekly - 2 sets - 10 reps - 3 sec hold - Single leg balance near counter   - 1 x daily - 3-5 x weekly - 3 sets - 30 second hold - Heel  Toe Raises with Counter Support  - 1 x daily - 3-5 x weekly - 2 sets - 15 reps - Sit to Stand with Arms Crossed  - 1 x daily - 3-5 x weekly - 2 sets - 10 reps  ASSESSMENT:  CLINICAL IMPRESSION:   Patient arrived with good motivation for completion of pt activities. BP BP still on the low side this AM but pt asymptomatic and showing improved numbers since a few visits ago. Pt discussed discharge plan from PT and pt comfortable with plan moving into what will likely be his final few weeks of PT preparing for discharge. Pt continues to have some dizziness at times but overall his balance reactions and safety awareness is adequate for safe community access. Will continue to progress to discharge from PT services in future sessions. Pt will continue to benefit from skilled physical therapy intervention to address impairments, improve QOL, and attain therapy goals.      OBJECTIVE IMPAIRMENTS: decreased  balance, decreased ROM, decreased strength, hypomobility, impaired flexibility, impaired UE functional use, and pain.   ACTIVITY LIMITATIONS: carrying, lifting, standing, bathing, dressing, reach over head, and hygiene/grooming PARTICIPATION LIMITATIONS: cleaning, laundry, shopping, community activity, and yard work PERSONAL FACTORS: Age and 3+ comorbidities: CAD, Depression, are also affecting patient's functional outcome.  REHAB POTENTIAL: Good CLINICAL DECISION MAKING: Stable/uncomplicated EVALUATION COMPLEXITY: Low   GOALS: Goals reviewed with patient? No  SHORT TERM GOALS: Target date: 01/03/2024  Patient will demonstrate undestanding of home exercise plan by performing exercises correctly with evidence of good carry over with min to no verbal or tactile cues .   Baseline: NT 12/25/23: Performing exercises independently  Goal status: ACHIEVED   2.  Patient will demonstrate understanding of how to modify exercises to decrease thoracic and cervical pain exacerbation for improved function and to decrease incidence of pain flare ups.  Baseline: Does not currently have strategy to manage this  Goal status: NOT MET      LONG TERM GOALS: Target date: 03/13/2024  Patient will show a statistically significant improvement in his neck function as evidence by >=7.5 pt decrease in her neck disability index score to better be able to move neck to improve visual field while driving to avoid accidents. Vernel et al, 2009) Baseline: 33/50 (66%); 02/09/24: 66% 03/01/2024: 50% 04/12/24: 28 (56%) Goal status: NOT MET     2.  Patient will increase cervical mobility by  >=5 degrees AROM for all planes of motion (flexion, extension, side bending, and rotation) for improved cervical function and evidence of pain reduction. Baseline: Cervical AROM Flex 30, Ext 30, Rot R/L 40/40, SB R/L 35/35; 02/09/24: Rt rotation 50 degrees, Left rotation: 53 degress  03/01/2024: Flex:  30*   Ext: 20*      Rot L: 32*      Rot R: 30*      SB L: 20*       SB R: 15* Goal status: MET    3.  Patient will improve shoulder and periscapular strength by 1/3 grade MMT (4- to 4) for improved cervical stability and UE function to carry out upper extremity activities like washing his car and gardening without being limited by pain and discomfort.  Baseline: Shoulder Flex R/L 4/4, Shoulder Abd R/L 4/4; 10/31: shoulder flexion Rt: 5/5, left 4+/5; shoulder ABDCT 5/5 bilat  03/01/2024: Shld Flex R: 5 Shld Flex L: 4+ , Shld abd R: 4 +  Shld Abd L: 4 Goal status: MET    4.  Patient  will reduce neck and thoracic pain to <=4/10 NRPS while performing UE activities like washing his car and doing yard work as evidence of improved cervical and thoracic function.  Baseline: 10/10 cervical paraspinal and rhomboid pain with UE activity; up to a 9/10 intermittently with activity   03/01/2024: 8/10 NPS UE activity 04/12/24: 8/10 NPS with UE activity Goal status: NOT MET    5. Patient (> 12 years old) will complete five times sit to stand test in < 15 seconds indicating an increased LE strength and improved balance. Baseline: 17.28s 04/12/24: 16.09 sec Goal status: INITIAL  6. Patient will increase Berg Balance score by > 6 points to demonstrate decreased fall risk during functional activities Baseline: 47/56 Goal status: INITIAL  7. Patient will increase 10 meter walk test to >1.27m/s as to improve gait speed for better community ambulation and to reduce fall risk. Baseline: normal pace: 0.79m/s; fast pace: 0.975s 04/12/24: normal pace: 0.97 m/s, 10.33 sec; fast pace 1.09 m/s, 9.13 s Goal status: INITIAL  8. Patient will reduce timed up and go to <10 seconds to reduce fall risk and demonstrate improved transfer/gait ability. Baseline: 11.27s 04/12/24: 9.26 sec Goal status: MET  9. Patient will decrease distance of by 146ft in order to increase cardiovascular endurance for improved community ambulation distance. Baseline: 959ft in 6  min w/ [facility] 04/12/24: 1336' in 6 min no AD Goal status: MET  10. Pt will increase score on FGA by 4 points in order to increase functional ambulation and increase confidence in community environments. Baseline: 17/30 04/12/24: 26/30 Goal status: INITIAL   PLAN:  PT FREQUENCY: 1-2x/week PT DURATION: 8 weeks PLANNED INTERVENTIONS: [id number]- PT Re-evaluation, 97750- Physical Performance Testing, 97110-Therapeutic exercises, 97530- Therapeutic activity, 97112- Neuromuscular re-education, 97535- Self Care, [id number]- Manual therapy, [phone]- Gait training, [phone]- Canalith repositioning, [id number]- Aquatic Therapy, [phone]- Electrical stimulation (unattended), [phone]- Electrical stimulation (manual), [id number]- Traction (mechanical), 20560 (1-2 muscles), 20561 (3+ muscles)- Dry Needling, Patient/Family education, Balance training, Joint mobilization, Joint manipulation, Spinal manipulation, Spinal mobilization, Vestibular training, DME instructions, Cryotherapy, and Moist heat  PLAN FOR NEXT SESSION:    -continue to address cervical ROM/pain -address global weakness, deconditioning, and imbalance.     Note: Portions of this document were prepared using Dragon voice recognition software and although reviewed may contain unintentional dictation errors in syntax, grammar, or spelling.  Lonni KATHEE Gainer PT ,DPT Physical Therapist- [facility]   04/17/2024, 8:06 AM   "

## 2024-04-17 NOTE — Therapy (Signed)
 " OUTPATIENT SPEECH LANGUAGE PATHOLOGY  SWALLOW TREATMENT     Patient Name: Shawn Lam MRN: [id number] DOB:[birth year], 74 y.o., male Today's Date: 04/17/2024  PCP: Greig Ring, MD REFERRING PROVIDER: Greig Ring, MD    End of Session - 04/17/24 0854     Visit Number 22    Number of Visits 43    Date for Recertification  06/18/24    Authorization Type Medicare Part A    Progress Note Due on Visit 30    SLP Start Time 0845    SLP Stop Time  0930    SLP Time Calculation (min) 45 min    Activity Tolerance Patient tolerated treatment well          Past Medical History:  Diagnosis Date   Actinic keratosis 02/04/2021   right lateral neck   Anemia    Anxiety    Asymptomatic LV dysfunction    50-55% by echo 06/2016   BCC (basal cell carcinoma of skin) 08/05/2020   left neck infra auricular - nodular pattern - treated with [facility] 09/17/2020   Berger's disease    Blood transfusion without reported diagnosis 04/11/2018   CAD (coronary artery disease) 06/2005   PCI of LAD and RCA   Cancer (HCC)    Cataract 04/11/2016   04/11/2016   Depression    Dilated aortic root    4cm at sinus of Valsalva   Hematuria    Hyperlipidemia    Hypothyroidism    Insomnia    Left ureteral stone    Orthostatic hypotension    negative tilt table   OSA (obstructive sleep apnea)    moderate with AHI 17/hr, uses CPAP nightly(01/15/19 - has Inspire implant)   Osteoporosis 04/11/2016   PVC's (premature ventricular contractions) 07/07/2016   Renal disorder    PT IS SEEING DR. BETSEY- NEPHROLOGIST   Rosacea    Sleep apnea 04/11/2016   04/11/2016   Ulcer 04/11/2018   Urticaria    Past Surgical History:  Procedure Laterality Date   ANTERIOR CERVICAL DECOMP/DISCECTOMY FUSION N/A 03/13/2023   Procedure: CERVICAL FOUR-FIVE, CERVICAL FIVE-SIX ANTERIOR CERVICAL DISCECTOMY/DECOMPRESSION FUSION;  Surgeon: Onetha Kuba, MD;  Location: [facility] OR;  Service: Neurosurgery;  Laterality: N/A;  3C   CARDIAC  CATHETERIZATION  09/06/2013   [facility]   CARDIAC CATHETERIZATION     COLONOSCOPY WITH PROPOFOL  N/A 01/21/2019   Procedure: COLONOSCOPY WITH PROPOFOL ;  Surgeon: Jinny Carmine, MD;  Location: [facility];  Service: Endoscopy;  Laterality: N/A;  sleep apnea   COLONOSCOPY WITH PROPOFOL  N/A 10/19/2021   Procedure: COLONOSCOPY WITH PROPOFOL ;  Surgeon: Jinny Carmine, MD;  Location: [facility] ENDOSCOPY;  Service: Endoscopy;  Laterality: N/A;   CORONARY ANGIOPLASTY  2004   STENT PLACEMENT   CYSTOSCOPY WITH RETROGRADE PYELOGRAM, URETEROSCOPY AND STENT PLACEMENT Left 03/19/2014   Procedure: CYSTOSCOPY WITH RETROGRADE PYELOGRAM, URETEROSCOPY AND STENT PLACEMENT;  Surgeon: Mickey Ricardo KATHEE Alvaro, MD;  Location: [facility] ORS;  Service: Urology;  Laterality: Left;   CYSTOSCOPY WITH RETROGRADE PYELOGRAM, URETEROSCOPY AND STENT PLACEMENT Bilateral 05/20/2015   Procedure:  CYSTOSCOPY WITH BILATERAL RETROGRADE PYELOGRAM,RIGHT  DIAGNOSTIC URETEROSCOPY ,LEFT URETEROSCOPY WITH HOLMIUM LASER  AND BILATERAL  STENT PLACEMENT ;  Surgeon: Ricardo Alvaro, MD;  Location: [facility] ORS;  Service: Urology;  Laterality: Bilateral;   DRUG INDUCED ENDOSCOPY N/A 10/20/2017   Procedure: DRUG INDUCED SLEEP ENDOSCOPY;  Surgeon: Carlie Clark, MD;  Location:  SURGERY CENTER;  Service: ENT;  Laterality: N/A;   EP IMPLANTABLE DEVICE N/A 05/13/2016   Procedure:  Loop Recorder Insertion;  Surgeon: Elspeth JAYSON Sage, MD;  Location: [facility] INVASIVE CV LAB;  Service: Cardiovascular;  Laterality: N/A;   ESOPHAGOGASTRODUODENOSCOPY N/A 10/19/2021   Procedure: ESOPHAGOGASTRODUODENOSCOPY (EGD);  Surgeon: Jinny Carmine, MD;  Location: [facility] ENDOSCOPY;  Service: Endoscopy;  Laterality: N/A;   ESOPHAGOGASTRODUODENOSCOPY (EGD) WITH PROPOFOL  N/A 11/30/2018   Procedure: ESOPHAGOGASTRODUODENOSCOPY (EGD) WITH PROPOFOL ;  Surgeon: Therisa Bi, MD;  Location: [facility] ENDOSCOPY;  Service: Gastroenterology;  Laterality: N/A;   ESOPHAGOGASTRODUODENOSCOPY (EGD) WITH PROPOFOL  N/A  12/01/2018   Procedure: ESOPHAGOGASTRODUODENOSCOPY (EGD) WITH PROPOFOL ;  Surgeon: Jinny Carmine, MD;  Location: [facility] ENDOSCOPY;  Service: Endoscopy;  Laterality: N/A;   HOLMIUM LASER APPLICATION Bilateral 05/20/2015   Procedure: HOLMIUM LASER APPLICATION;  Surgeon: Ricardo Likens, MD;  Location: [facility];  Service: Urology;  Laterality: Bilateral;   IMPLIMENTATION OF A HYPOGLOSSAL NERVE STIMULATOR  12/08/2017   OSA    IR GASTROSTOMY TUBE MOD SED  01/18/2024   IR RADIOLOGIST EVAL & MGMT  02/13/2024   LEFT HEART CATHETERIZATION WITH CORONARY ANGIOGRAM N/A 09/06/2013   Procedure: LEFT HEART CATHETERIZATION WITH CORONARY ANGIOGRAM;  Surgeon: Victory LELON Claudene DOUGLAS, MD;  Location: [facility] CATH LAB;  Service: Cardiovascular;  Laterality: N/A;   LEFT KNEE CAP  SURGERY ABOUT 40 YRS AGO  ? 1970     SPINE SURGERY  Desk in neck   STONE EXTRACTION WITH BASKET Left 03/19/2014   Procedure: STONE EXTRACTION WITH BASKET;  Surgeon: Mickey Ricardo KATHEE Likens, MD;  Location: [facility] ORS;  Service: Urology;  Laterality: Left;   TRANSURETHRAL RESECTION OF PROSTATE N/A 08/25/2021   Procedure: TRANSURETHRAL RESECTION OF THE PROSTATE (TURP);  Surgeon: Likens Ricardo, MD;  Location: [facility] OR[facility];  Service: Urology;  Laterality: N/A;  1 HR   Patient Active Problem List   Diagnosis Date Noted   Presence of heart assist device (HCC) 04/16/2024   Interstitial pulmonary disease, unspecified (HCC) 04/16/2024   Abnormal PET scan of lung 03/29/2024   Gastrointestinal tube present (HCC) 02/18/2024   Granulation tissue of site of gastrostomy 02/06/2024   Protein-calorie malnutrition, severe 01/23/2024   Multifocal pneumonia 01/22/2024   Chronic pulmonary aspiration 12/29/2023   Silent aspiration 12/29/2023   Moderate protein-calorie malnutrition (weight for age 43-74% of standard) 12/29/2023   Bilateral leg weakness 12/29/2023   Chronic constipation 12/29/2023   Somnolence 12/29/2023   Frequent falls 12/29/2023   Feeding difficulties, unspecified  12/20/2023   Shortness of breath 12/20/2023   Abnormal CT of the chest 10/10/2023   Carotid stenosis 05/11/2023   Spinal stenosis in cervical region 03/13/2023   Osteoarthritis of spine with radiculopathy, cervical region 07/14/2022   Cervicogenic headache 11/30/2021   Chronic neck pain 11/30/2021   Weight loss, non-intentional    Dysphagia    Polyp of colon    Prostatic hyperplasia 08/25/2021   Chronic insomnia 03/19/2020   Peripheral neuropathy 03/19/2020   Iron deficiency anemia 01/06/2020   Allergic dermatitis 07/08/2019   Osteoarthritis of left knee 02/26/2019   History of colonic polyps    History of duodenal ulcer    Chronic kidney disease, stage 3b (HCC)    Bilateral chronic knee pain 11/09/2018   Epistaxis 10/02/2018   Subjective tinnitus, bilateral 10/02/2018   Chronic pain of right thumb 02/22/2018   Acute bilateral thoracic back pain 01/23/2018   Osteoporosis without current pathological fracture 02/10/2017   Hypothyroidism 11/02/2016   Thoracic compression fracture (HCC) 09/06/2016   PVC's (premature ventricular contractions) 07/07/2016   Hypoalbuminemia 06/28/2016   IgA nephropathy 06/07/2016   Acute buttock  pain 05/23/2016   MDD (major depressive disorder), recurrent episode, moderate (HCC) 11/20/2015   OSA (obstructive sleep apnea) 02/05/2014   Orthostatic hypotension 02/07/2013   Scotoma involving central area 01/29/2013   Optic neuropathy 01/28/2013   HEMATURIA UNSPECIFIED 01/21/2010   HLD (hyperlipidemia) 07/05/2006   Generalized anxiety disorder 07/05/2006   CAD (coronary artery disease) 07/05/2006   GERD 07/05/2006    ONSET DATE: 03/2023 following ACDF; Date of referral 12/28/2023: date of this referral 02/01/2024  REFERRING DIAG:  T17.908D (ICD-10-CM) - Chronic pulmonary aspiration, subsequent encounter  T17.900D (ICD-10-CM) - Silent aspiration, subsequent encounter  E44.0 (ICD-10-CM) - Moderate protein-calorie malnutrition  R13.10 (ICD-10-CM) -  Dysphagia, unspecified type    THERAPY DIAG:  Dysphagia, oropharyngeal phase  Rationale for Evaluation and Treatment Rehabilitation  SUBJECTIVE:   PERTINENT HISTORY and DIAGNOSTIC FINDINGS:  Pt is a 74 yo male w/ ACDF surgery 03/2023.  PMH: GERD(Not on a PPI per chart), Actinic keratosis (02/04/2021), Anemia, Anxiety, Asymptomatic LV dysfunction, BCC (basal cell carcinoma of skin) (08/05/2020), Berger's disease, CAD (coronary artery disease) (06/2005), resting tremor, Depression, Dilated aortic root (HCC), Hematuria, Hyperlipidemia, Hypothyroidism, Insomnia, Left ureteral stone, Orthostatic hypotension, OSA (obstructive sleep apnea), PVC's (premature ventricular contractions) (07/07/2016), Renal disorder, Rosacea, and Urticaria.  Pt is followed by Cardiology for Coronary Artery Stenosis currently; also followed for Migraines.     EGD in 2023 revealed Gastritis.      CT of Chest 10/2023: 1. Interim development of patchy consolidation and ground-glass   disease most evident at the right middle lobe and right lower lobes   with additional areas of mild ground-glass disease and scattered   clustered nodularity, findings are suspicious for multifocal   infection/pneumonia. Imaging follow-up to resolution is recommended.   2. Emphysema.   3. Aortic atherosclerosis.            Dysphagia - reported by pt as Chronic, ever since neck surgery in December 2024. Pt was seen in the emergency room s/p Fall, notes detailing that he slipped on soap in the shower, fell backwards on right side hit head and neck against shower seat, possible brief loss of consciousness, per chart.  ACDF 03/2023 per chart.  Since the neck surgery in 03/2023, he has noted food, liquids and pill getting stuck on the right side of my throat more.  I used to cough a lot more but not as much now.  I cough up what gets stuck..  He has lost significant weight per chart notes due to decreased eating.  Pt endorsed using his home O2 when he  feels SOB.   MBSS 12/26/2023 Patient appears to present w/ Chronic, moderate-severe Pharyngeal phase dysphagia per this assessment today. It appeared there was some impact from changes in the Cervical Esophagus s/p ACDF surgery(03/2023 per chart notes) including structural/tissue changes in/aournd the CP segment and cervical esophagus. Pt with intermittent silent aspiration of thin liquids.   Recommend continue current diet of easy to eat foods at home; thin liquids via cup- at this time. Oral care Before any oral intake. Swallow Strategies Recommended: No straws, moistened foods, SMALL bites/sips, Head Turn RIGHT w/ slight chin tuck, multiple swallows to aid clearing, AND strong Cough/throat clear b/t bites/sips and at end of meal.  D/t the Chronicity of pt's Pharyngeal phase Dysphagia w/ weight loss and pulmonary impact per recent Chest CT, pt and Family may need to discuss alternative means of nutrition/hydration as support w/ his Medical Team/PCP, while seeking swallow therapy.  Received ENT report dated 01/12/2024  Laryngoscopy revealed  Moderate vocal fold bowing bilaterally and moderate glottic gamp with phonation Impression/plan: Aspiration on MBSS and significant weight loss since ACDF last year. TVF shows moderate sized glottic gap on laryngoscoy but mobile vocal folds bilaterally. Pooling in pyriform on MBSS but no significant pooling of secretions on laryngoscopy. I feel that between speech therpay and possibly bilateral injection laryngoplasty, hopefully his swallowing will significantly improve. Will make referral to Dr Brien at [facility].   Pt and his wife report appt with Dr Brien in January 2026.  10/10/205 PEG placed  01/22/2024 thru 01/24/2024 Hospitalized d/t fall and confusion CT of the chest on arrival showed multifocal pneumonia and possible free air. When reviewed further with interventional radiology team it was determined this was an appropriate amount of free air  secondary to recent PEG tube placement. Patient was initiated on antibiotic therapy for pneumonia and improved significantly.   02/07/2024 CT Chest 1. Evolving multifocal inflammatory infiltrates, progressive within the right upper lobe and improved within the lung bases bilaterally. Recommend appropriate clinical management and imaging follow-up per clinical course. 2. Mild emphysema. Given emphysema as an independent lung cancer risk factor, consider evaluation for a low-dose CT lung cancer screening program if the patient is 74 years old and otherwise eligible.   PAIN:  Are you having pain? No  FALLS: Has patient fallen in last 6 months?  See PT evaluation for details  LIVING ENVIRONMENT: Lives with: lives with their spouse Lives in: House/apartment  PLOF:  Level of assistance: Independent with ADLs, Independent with IADLs Employment: Retired   PATIENT GOALS  to improve swallow function, nutrition and hydration, decrease aspiration risks  SUBJECTIVE STATEMENT: Pt pleasant,  Pt accompanied by: self   OBJECTIVE:   TODAY'S TREATMENT:  Skilled treatment session focused on pt's dysphagia goals. SLP facilitated the session by providing the following interventions:     I had my physical and I still haven't gained any weight with the new formula.   Respiratory Muscle Strengthening: Pt independently able to maneuver devices correctly. EMST 75 set to 55 cm H2O: Pt able to complete three sets of 10 reps - independently IMST set to 55 cm H2O: Pt able to complete 3 sets of 10 reps independently,   Pharyngeal exercises: Masako: Pt able to complete 3 sets of 8 reps independently.  Head Extension Swallowing Exercise - Pt able to complete 3 sets of 10 repetitions independently.  CTAR: Pt able to independently complete 3 sets of 8 reps.    PO consumption: Skilled observation was provided of pt consuming thin liquids via cup. Pt with no overt s/s of aspiration (however, pt with  confirmed silent aspiration) - will plan to repeat MBSS                    PATIENT EDUCATION: Education details: as above Person educated: Patient and Spouse Education method: Chief Technology Officer Education comprehension: needs further education  HOME EXERCISE PROGRAM:     See above   GOALS: Goals reviewed with patient? Yes   SHORT TERM GOALS: Target date: 10 sessions            Updated 02/28/2024            Updated 02/09/2024            Update 03/26/2024 With Mod I, pt will perform compensatory swallow strategies (chin tuck, multiple swallows, head turn) to eliminate s/s of aspiration when consuming least restrictive diet. Baseline: Goal status: MET: no overt  s/sx with water  consumption this date   2.  With Min A, pt will complete (supraglottic, swallow, Mednelson maneuver, effortful swallow) to improve oral motor weakness, tongue base retraction, hyolarnygeal excursion, airway protection and/or clearance of the bolus through the pharynx. Baseline:  Goal status: IN PROGRESS: progress made; moderate A: Progress made, fluctuating between supervision and independent cues   3. Pt will demonstrate overt s/s of aspiration when consuming ice chips.              Baseline: Goal status: IN PROGRESSING: no overt s/s of aspiration observed during today's session: Intermittent overt s/s observed     LONG TERM GOALS: Target date: 06/18/2024             Updated 02/28/2024             Updated: 02/09/2024             Updated: 03/26/2024    With Mod I, pt will demonstrate the ability to adequately self-monitor swallowing skills and perform appropriate compensatory techniques to reduce s/s of aspiration and to safely consume least restrictive diet.     Baseline:  Goal status: IN PROGRESS: continues to be appropriate goal   2.  Pt will participate in repeat instrumental swallow evaluation to assess least restrictive diet.  Baseline:  Goal status: IN PROGRESS: continues to be appropriate  goal: Modified Barium scheduled completed 11/21; would benefit from repeat MBSS  ASSESSMENT:  CLINICAL IMPRESSION: Patient is a 74 y.o. male who was seen today for a clinical swallow re-evaluation d/t severe pharyngeal phase dysphagia following ACDF (03/2023).    See the above treatment note for details. Order received for repeat MBSS, scheduled for 04/18/2024 at 11am.  OBJECTIVE IMPAIRMENTS include dysphagia. These impairments are limiting patient from safety when swallowing. Factors affecting potential to achieve goals and functional outcome are severity of impairments and malnutrition. Patient will benefit from skilled SLP services to address above impairments and improve overall function.  REHAB POTENTIAL: Good  PLAN: SLP FREQUENCY: 1-2x/week  SLP DURATION: 12 weeks  PLANNED INTERVENTIONS: Aspiration precaution training, Pharyngeal strengthening exercises, Diet toleration management , Oral motor exercises, SLP instruction and feedback, Compensatory strategies, and Patient/family education   Unnamed Hino B. Rubbie, M.S., CCC-SLP, CBIS Speech-Language Pathologist Certified Brain Injury Specialist [facility]  [facility] [phone] Ascom [phone] Fax [phone]               "

## 2024-04-18 ENCOUNTER — Encounter: Admitting: Speech Pathology

## 2024-04-18 ENCOUNTER — Ambulatory Visit

## 2024-04-18 ENCOUNTER — Ambulatory Visit
Admission: RE | Admit: 2024-04-18 | Discharge: 2024-04-18 | Disposition: A | Discharge status: Home / Self Care | Source: Ambulatory Visit | Attending: Family Medicine | Admitting: Family Medicine

## 2024-04-18 ENCOUNTER — Ambulatory Visit: Payer: Self-pay | Admitting: Family Medicine

## 2024-04-18 DIAGNOSIS — T17908D Unspecified foreign body in respiratory tract, part unspecified causing other injury, subsequent encounter: Secondary | ICD-10-CM | POA: Diagnosis present

## 2024-04-18 DIAGNOSIS — E440 Moderate protein-calorie malnutrition: Secondary | ICD-10-CM | POA: Insufficient documentation

## 2024-04-18 DIAGNOSIS — R1310 Dysphagia, unspecified: Secondary | ICD-10-CM | POA: Insufficient documentation

## 2024-04-18 DIAGNOSIS — R633 Feeding difficulties, unspecified: Secondary | ICD-10-CM | POA: Insufficient documentation

## 2024-04-18 NOTE — Procedures (Signed)
 Modified Barium Swallow Study  Patient Details  Name: Shawn Lam MRN: [id number] Date of Birth: 15-May-1950  Today's Date: 04/18/2024  Modified Barium Swallow completed.  Full report located under Chart Review in the Imaging Section.  History of Present Illness Pt is a 74 year old male with dysphagia following ACDF (03/2023). MBSS 12/26/2023 revealed moderate to severe pharyngeal phase dysphagia with cervical esophageal changes in/around the CP segment and cervical esophagus. Pt with intermittent silent aspiration of thin liquids. ENT evaluation with laryngoscopy on 01/12/2024 revealed moderate vocal fold bowing biltaterally with moderate glottic gap with phonation. Referred to Dr Brien at [facility] for potential bilateral injection laryngoplasty (appt made for January 2026). Pt with continued unintentional weight loss and PEG placement on 01/19/2024. Has participated in outpatient ST services targeting pharyngeal strengthening and RMT. Pt with improving chest imaging. Repeat MBSS on 03/01/2024 that moderate to severe pharyngeal phase dysphagia as well as reduced amplitude and duration of PE segment opening This results in silent aspiration of thin liquids and nectar thick liquids. When consuming thin liquids and nectar thick liquids, pt's pharyngeal phase is c/b reduced anterior movement of hyoid, epiglottic inversion and glottal closure. Significant pharyngeal residue remained within the pyriform sinuses d/t minimal transit thru PE segment. Despite multiple cued swallows and use of compensatory swallow strategies, pt largely not able to clear residue. Aspiration occurred during initial swallow as well as subsequent efforts to clear residue. Pt is at a high risk of aspiration when consuming larger amounts of PO diet. Recommend he continue nutrition and hydration thru PEG with follow up Outpatient ST and otolaryngology at [facility] (January 2026).    Pt has been participating in skilled  outpatient ST services with this writer targeting intensive pharyngeal strengthening and RMT.   Clinical Impression  Pt presents with improved swallow function - NO ASPIRATION observed during this study across multiple trials of thin liquids, nectar thick liquids, puree, graham cracker with barium paste and whole barium tablet with thin liquids.   Pt currently presents mild oropharyngeal dysphagia with no aspiration observed during today's study. Pt's oral phase is c/b decreased lingual manipulation of boluses resulting in oral residue prompting second swallow.  During this second swallow, pt with reduced epiglottic deflection vs initial swallow of bolus. Penetration observed during this second swallow intermittently but didn't collect and remained a trace undercoating on epiglottis. Pt with significantly improved pharyngeal phase with more timely swallow initiation observed at the level of vallecula, increased base of tongue strength with only trace residue, overall improve pharyngeal strength with trace residue for thin liquids, nectar thick liquids and minimal collection of residue when consuming puree and graham cracker, improved epiglottic deflection (complete during initial swallows of each bolus), improved passage of all boluses thru UES (including whole barium tablet). Based on this study, pt appears appropriate to return to PO intake with strict aspiration precautions. Will review study, results and recommendations with pt at next outpatient ST session. Recommend pt continue to have appt with Dr Brien at [facility] on January 26th.    Factors that may increase risk of adverse event in presence of aspiration Noe & Lianne 2021): Frail or deconditioned  Swallow Evaluation Recommendations Recommendations: PO diet PO Diet Recommendation: Dysphagia 2 (Finely chopped);Thin liquids (Level 0) Liquid Administration via: Cup Medication Administration: Whole meds with puree Supervision: Patient  able to self-feed Swallowing strategies  : Minimize environmental distractions;Slow rate;Small bites/sips Postural changes: Position pt fully upright for meals;Stay upright 30-60 min after meals  Oral care recommendations: Oral care BID (2x/day)      Sarabi Sockwell 04/18/2024,6:05 PM

## 2024-04-19 ENCOUNTER — Ambulatory Visit: Admitting: Physical Therapy

## 2024-04-19 ENCOUNTER — Ambulatory Visit: Admitting: Speech Pathology

## 2024-04-19 DIAGNOSIS — R1312 Dysphagia, oropharyngeal phase: Secondary | ICD-10-CM

## 2024-04-19 DIAGNOSIS — R2689 Other abnormalities of gait and mobility: Secondary | ICD-10-CM

## 2024-04-19 DIAGNOSIS — M542 Cervicalgia: Secondary | ICD-10-CM

## 2024-04-19 DIAGNOSIS — R269 Unspecified abnormalities of gait and mobility: Secondary | ICD-10-CM

## 2024-04-19 DIAGNOSIS — M546 Pain in thoracic spine: Secondary | ICD-10-CM

## 2024-04-19 DIAGNOSIS — M6281 Muscle weakness (generalized): Secondary | ICD-10-CM

## 2024-04-19 NOTE — Therapy (Signed)
 " OUTPATIENT SPEECH LANGUAGE PATHOLOGY  SWALLOW TREATMENT     Patient Name: Shawn Lam MRN: [id number] DOB:05-Sep-1950, 74 y.o., male Today's Date: 04/19/2024  PCP: Greig Ring, MD REFERRING PROVIDER: Greig Ring, MD    End of Session - 04/19/24 0842     Visit Number 23    Number of Visits 43    Date for Recertification  06/18/24    Authorization Type Medicare Part A    Progress Note Due on Visit 30    SLP Start Time 0800    SLP Stop Time  0840    SLP Time Calculation (min) 40 min    Activity Tolerance Patient tolerated treatment well          Past Medical History:  Diagnosis Date   Actinic keratosis 02/04/2021   right lateral neck   Anemia    Anxiety    Asymptomatic LV dysfunction    50-55% by echo 06/2016   BCC (basal cell carcinoma of skin) 08/05/2020   left neck infra auricular - nodular pattern - treated with [facility] 09/17/2020   Berger's disease    Blood transfusion without reported diagnosis 04/11/2018   CAD (coronary artery disease) 06/2005   PCI of LAD and RCA   Cancer (HCC)    Cataract 04/11/2016   04/11/2016   Depression    Dilated aortic root    4cm at sinus of Valsalva   Hematuria    Hyperlipidemia    Hypothyroidism    Insomnia    Left ureteral stone    Orthostatic hypotension    negative tilt table   OSA (obstructive sleep apnea)    moderate with AHI 17/hr, uses CPAP nightly(01/15/19 - has Inspire implant)   Osteoporosis 04/11/2016   PVC's (premature ventricular contractions) 07/07/2016   Renal disorder    PT IS SEEING DR. BETSEY- NEPHROLOGIST   Rosacea    Sleep apnea 04/11/2016   04/11/2016   Ulcer 04/11/2018   Urticaria    Past Surgical History:  Procedure Laterality Date   ANTERIOR CERVICAL DECOMP/DISCECTOMY FUSION N/A 03/13/2023   Procedure: CERVICAL FOUR-FIVE, CERVICAL FIVE-SIX ANTERIOR CERVICAL DISCECTOMY/DECOMPRESSION FUSION;  Surgeon: Onetha Kuba, MD;  Location: [facility] OR;  Service: Neurosurgery;  Laterality: N/A;  3C   CARDIAC  CATHETERIZATION  09/06/2013   [facility]   CARDIAC CATHETERIZATION     COLONOSCOPY WITH PROPOFOL  N/A 01/21/2019   Procedure: COLONOSCOPY WITH PROPOFOL ;  Surgeon: Jinny Carmine, MD;  Location: [facility];  Service: Endoscopy;  Laterality: N/A;  sleep apnea   COLONOSCOPY WITH PROPOFOL  N/A 10/19/2021   Procedure: COLONOSCOPY WITH PROPOFOL ;  Surgeon: Jinny Carmine, MD;  Location: [facility] ENDOSCOPY;  Service: Endoscopy;  Laterality: N/A;   CORONARY ANGIOPLASTY  2004   STENT PLACEMENT   CYSTOSCOPY WITH RETROGRADE PYELOGRAM, URETEROSCOPY AND STENT PLACEMENT Left 03/19/2014   Procedure: CYSTOSCOPY WITH RETROGRADE PYELOGRAM, URETEROSCOPY AND STENT PLACEMENT;  Surgeon: Mickey Ricardo KATHEE Alvaro, MD;  Location: [facility] ORS;  Service: Urology;  Laterality: Left;   CYSTOSCOPY WITH RETROGRADE PYELOGRAM, URETEROSCOPY AND STENT PLACEMENT Bilateral 05/20/2015   Procedure:  CYSTOSCOPY WITH BILATERAL RETROGRADE PYELOGRAM,RIGHT  DIAGNOSTIC URETEROSCOPY ,LEFT URETEROSCOPY WITH HOLMIUM LASER  AND BILATERAL  STENT PLACEMENT ;  Surgeon: Ricardo Alvaro, MD;  Location: [facility];  Service: Urology;  Laterality: Bilateral;   DRUG INDUCED ENDOSCOPY N/A 10/20/2017   Procedure: DRUG INDUCED SLEEP ENDOSCOPY;  Surgeon: Carlie Clark, MD;  Location: [facility];  Service: ENT;  Laterality: N/A;   EP IMPLANTABLE DEVICE N/A 05/13/2016   Procedure:  Loop Recorder Insertion;  Surgeon: Elspeth JAYSON Sage, MD;  Location: [facility] INVASIVE CV LAB;  Service: Cardiovascular;  Laterality: N/A;   ESOPHAGOGASTRODUODENOSCOPY N/A 10/19/2021   Procedure: ESOPHAGOGASTRODUODENOSCOPY (EGD);  Surgeon: Jinny Carmine, MD;  Location: [facility] ENDOSCOPY;  Service: Endoscopy;  Laterality: N/A;   ESOPHAGOGASTRODUODENOSCOPY (EGD) WITH PROPOFOL  N/A 11/30/2018   Procedure: ESOPHAGOGASTRODUODENOSCOPY (EGD) WITH PROPOFOL ;  Surgeon: Therisa Bi, MD;  Location: [facility] ENDOSCOPY;  Service: Gastroenterology;  Laterality: N/A;   ESOPHAGOGASTRODUODENOSCOPY (EGD) WITH PROPOFOL  N/A  12/01/2018   Procedure: ESOPHAGOGASTRODUODENOSCOPY (EGD) WITH PROPOFOL ;  Surgeon: Jinny Carmine, MD;  Location: [facility] ENDOSCOPY;  Service: Endoscopy;  Laterality: N/A;   HOLMIUM LASER APPLICATION Bilateral 05/20/2015   Procedure: HOLMIUM LASER APPLICATION;  Surgeon: Ricardo Likens, MD;  Location: [facility];  Service: Urology;  Laterality: Bilateral;   IMPLIMENTATION OF A HYPOGLOSSAL NERVE STIMULATOR  12/08/2017   OSA    IR GASTROSTOMY TUBE MOD SED  01/18/2024   IR RADIOLOGIST EVAL & MGMT  02/13/2024   LEFT HEART CATHETERIZATION WITH CORONARY ANGIOGRAM N/A 09/06/2013   Procedure: LEFT HEART CATHETERIZATION WITH CORONARY ANGIOGRAM;  Surgeon: Victory LELON Claudene DOUGLAS, MD;  Location: [facility] CATH LAB;  Service: Cardiovascular;  Laterality: N/A;   LEFT KNEE CAP  SURGERY ABOUT 40 YRS AGO  ? 1970     SPINE SURGERY  Desk in neck   STONE EXTRACTION WITH BASKET Left 03/19/2014   Procedure: STONE EXTRACTION WITH BASKET;  Surgeon: Mickey Ricardo KATHEE Likens, MD;  Location: [facility];  Service: Urology;  Laterality: Left;   TRANSURETHRAL RESECTION OF PROSTATE N/A 08/25/2021   Procedure: TRANSURETHRAL RESECTION OF THE PROSTATE (TURP);  Surgeon: Likens Ricardo, MD;  Location: [facility];  Service: Urology;  Laterality: N/A;  1 HR   Patient Active Problem List   Diagnosis Date Noted   Presence of heart assist device (HCC) 04/16/2024   Interstitial pulmonary disease, unspecified (HCC) 04/16/2024   Abnormal PET scan of lung 03/29/2024   Gastrointestinal tube present (HCC) 02/18/2024   Granulation tissue of site of gastrostomy 02/06/2024   Protein-calorie malnutrition, severe 01/23/2024   Multifocal pneumonia 01/22/2024   Chronic pulmonary aspiration 12/29/2023   Silent aspiration 12/29/2023   Moderate protein-calorie malnutrition (weight for age 13-74% of standard) 12/29/2023   Bilateral leg weakness 12/29/2023   Chronic constipation 12/29/2023   Somnolence 12/29/2023   Frequent falls 12/29/2023   Feeding difficulties, unspecified  12/20/2023   Shortness of breath 12/20/2023   Abnormal CT of the chest 10/10/2023   Carotid stenosis 05/11/2023   Spinal stenosis in cervical region 03/13/2023   Osteoarthritis of spine with radiculopathy, cervical region 07/14/2022   Cervicogenic headache 11/30/2021   Chronic neck pain 11/30/2021   Weight loss, non-intentional    Dysphagia    Polyp of colon    Prostatic hyperplasia 08/25/2021   Chronic insomnia 03/19/2020   Peripheral neuropathy 03/19/2020   Iron deficiency anemia 01/06/2020   Allergic dermatitis 07/08/2019   Osteoarthritis of left knee 02/26/2019   History of colonic polyps    History of duodenal ulcer    Chronic kidney disease, stage 3b (HCC)    Bilateral chronic knee pain 11/09/2018   Epistaxis 10/02/2018   Subjective tinnitus, bilateral 10/02/2018   Chronic pain of right thumb 02/22/2018   Acute bilateral thoracic back pain 01/23/2018   Osteoporosis without current pathological fracture 02/10/2017   Hypothyroidism 11/02/2016   Thoracic compression fracture (HCC) 09/06/2016   PVC's (premature ventricular contractions) 07/07/2016   Hypoalbuminemia 06/28/2016   IgA nephropathy 06/07/2016   Acute buttock  pain 05/23/2016   MDD (major depressive disorder), recurrent episode, moderate (HCC) 11/20/2015   OSA (obstructive sleep apnea) 02/05/2014   Orthostatic hypotension 02/07/2013   Scotoma involving central area 01/29/2013   Optic neuropathy 01/28/2013   HEMATURIA UNSPECIFIED 01/21/2010   HLD (hyperlipidemia) 07/05/2006   Generalized anxiety disorder 07/05/2006   CAD (coronary artery disease) 07/05/2006   GERD 07/05/2006    ONSET DATE: 03/2023 following ACDF; Date of referral 12/28/2023: date of this referral 02/01/2024  REFERRING DIAG:  T17.908D (ICD-10-CM) - Chronic pulmonary aspiration, subsequent encounter  T17.900D (ICD-10-CM) - Silent aspiration, subsequent encounter  E44.0 (ICD-10-CM) - Moderate protein-calorie malnutrition  R13.10 (ICD-10-CM) -  Dysphagia, unspecified type    THERAPY DIAG:  Dysphagia, oropharyngeal phase  Rationale for Evaluation and Treatment Rehabilitation  SUBJECTIVE:   PERTINENT HISTORY and DIAGNOSTIC FINDINGS:  Pt is a 74 yo male w/ ACDF surgery 03/2023.  PMH: GERD(Not on a PPI per chart), Actinic keratosis (02/04/2021), Anemia, Anxiety, Asymptomatic LV dysfunction, BCC (basal cell carcinoma of skin) (08/05/2020), Berger's disease, CAD (coronary artery disease) (06/2005), resting tremor, Depression, Dilated aortic root (HCC), Hematuria, Hyperlipidemia, Hypothyroidism, Insomnia, Left ureteral stone, Orthostatic hypotension, OSA (obstructive sleep apnea), PVC's (premature ventricular contractions) (07/07/2016), Renal disorder, Rosacea, and Urticaria.  Pt is followed by Cardiology for Coronary Artery Stenosis currently; also followed for Migraines.     EGD in 2023 revealed Gastritis.      CT of Chest 10/2023: 1. Interim development of patchy consolidation and ground-glass   disease most evident at the right middle lobe and right lower lobes   with additional areas of mild ground-glass disease and scattered   clustered nodularity, findings are suspicious for multifocal   infection/pneumonia. Imaging follow-up to resolution is recommended.   2. Emphysema.   3. Aortic atherosclerosis.            Dysphagia - reported by pt as Chronic, ever since neck surgery in December 2024. Pt was seen in the emergency room s/p Fall, notes detailing that he slipped on soap in the shower, fell backwards on right side hit head and neck against shower seat, possible brief loss of consciousness, per chart.  ACDF 03/2023 per chart.  Since the neck surgery in 03/2023, he has noted food, liquids and pill getting stuck on the right side of my throat more.  I used to cough a lot more but not as much now.  I cough up what gets stuck..  He has lost significant weight per chart notes due to decreased eating.  Pt endorsed using his home O2 when he  feels SOB.   MBSS 12/26/2023 Patient appears to present w/ Chronic, moderate-severe Pharyngeal phase dysphagia per this assessment today. It appeared there was some impact from changes in the Cervical Esophagus s/p ACDF surgery(03/2023 per chart notes) including structural/tissue changes in/aournd the CP segment and cervical esophagus. Pt with intermittent silent aspiration of thin liquids.   Recommend continue current diet of easy to eat foods at home; thin liquids via cup- at this time. Oral care Before any oral intake. Swallow Strategies Recommended: No straws, moistened foods, SMALL bites/sips, Head Turn RIGHT w/ slight chin tuck, multiple swallows to aid clearing, AND strong Cough/throat clear b/t bites/sips and at end of meal.  D/t the Chronicity of pt's Pharyngeal phase Dysphagia w/ weight loss and pulmonary impact per recent Chest CT, pt and Family may need to discuss alternative means of nutrition/hydration as support w/ his Medical Team/PCP, while seeking swallow therapy.  Received ENT report dated 01/12/2024  Laryngoscopy revealed  Moderate vocal fold bowing bilaterally and moderate glottic gamp with phonation Impression/plan: Aspiration on MBSS and significant weight loss since ACDF last year. TVF shows moderate sized glottic gap on laryngoscoy but mobile vocal folds bilaterally. Pooling in pyriform on MBSS but no significant pooling of secretions on laryngoscopy. I feel that between speech therpay and possibly bilateral injection laryngoplasty, hopefully his swallowing will significantly improve. Will make referral to Dr Brien at [facility].   Pt and his wife report appt with Dr Brien in January 2026.  10/10/205 PEG placed  01/22/2024 thru 01/24/2024 Hospitalized d/t fall and confusion CT of the chest on arrival showed multifocal pneumonia and possible free air. When reviewed further with interventional radiology team it was determined this was an appropriate amount of free air  secondary to recent PEG tube placement. Patient was initiated on antibiotic therapy for pneumonia and improved significantly.   02/07/2024 CT Chest 1. Evolving multifocal inflammatory infiltrates, progressive within the right upper lobe and improved within the lung bases bilaterally. Recommend appropriate clinical management and imaging follow-up per clinical course. 2. Mild emphysema. Given emphysema as an independent lung cancer risk factor, consider evaluation for a low-dose CT lung cancer screening program if the patient is 74 years old and otherwise eligible.   PAIN:  Are you having pain? No  FALLS: Has patient fallen in last 6 months?  See PT evaluation for details  LIVING ENVIRONMENT: Lives with: lives with their spouse Lives in: House/apartment  PLOF:  Level of assistance: Independent with ADLs, Independent with IADLs Employment: Retired   PATIENT GOALS  to improve swallow function, nutrition and hydration, decrease aspiration risks  SUBJECTIVE STATEMENT: Pt pleasant, pt's wife also attended today's session Pt accompanied by: self   OBJECTIVE:   TODAY'S TREATMENT:  Skilled treatment session focused on pt's dysphagia goals. SLP facilitated the session by providing the following interventions:     Pt's wife attended session to receive results of yesterday's MBSS. This clinical research associate provided description of the results, improved ability, diet recommendation as well as instructions to continue current number of cartons and completion of pharyngeal strengthening HEP.   Skilled instruction provided in reducing oral prep of boluses and focus on 1 swallow per bolus of thin liquids. Education provided on efficiency of initial swallow vs second swallow. Pt voiced understanding and was able to demonstrate when consuming th liquids via cup in the session.  Written information provided as well as recommendation to conservatively initiate consuming of thin liquids. List of thin liquids  also provided with pt examples answered.  Education also provided on need to keep appt with otolaryngologist at [facility].    PATIENT EDUCATION: Education details: as above Person educated: Patient and Spouse Education method: Chief Technology Officer Education comprehension: needs further education  HOME EXERCISE PROGRAM:     See above   GOALS: Goals reviewed with patient? Yes   SHORT TERM GOALS: Target date: 10 sessions            Updated 02/28/2024            Updated 02/09/2024            Update 03/26/2024 With Mod I, pt will perform compensatory swallow strategies (chin tuck, multiple swallows, head turn) to eliminate s/s of aspiration when consuming least restrictive diet. Baseline: Goal status: MET: no overt s/sx with water  consumption this date   2.  With Min A, pt will complete (supraglottic, swallow, Mednelson maneuver, effortful swallow) to improve oral motor  weakness, tongue base retraction, hyolarnygeal excursion, airway protection and/or clearance of the bolus through the pharynx. Baseline:  Goal status: IN PROGRESS: progress made; moderate A: Progress made, fluctuating between supervision and independent cues   3. Pt will demonstrate overt s/s of aspiration when consuming ice chips.              Baseline: Goal status: IN PROGRESSING: no overt s/s of aspiration observed during today's session: Intermittent overt s/s observed     LONG TERM GOALS: Target date: 06/18/2024             Updated 02/28/2024             Updated: 02/09/2024             Updated: 03/26/2024    With Mod I, pt will demonstrate the ability to adequately self-monitor swallowing skills and perform appropriate compensatory techniques to reduce s/s of aspiration and to safely consume least restrictive diet.     Baseline:  Goal status: IN PROGRESS: continues to be appropriate goal   2.  Pt will participate in repeat instrumental swallow evaluation to assess least restrictive diet.  Baseline:   Goal status: IN PROGRESS: continues to be appropriate goal: Modified Barium scheduled completed 11/21; would benefit from repeat MBSS  ASSESSMENT:  CLINICAL IMPRESSION: Patient is a 74 y.o. male who was seen today for a clinical swallow re-evaluation d/t severe pharyngeal phase dysphagia following ACDF (03/2023).    See the above treatment note for details.   OBJECTIVE IMPAIRMENTS include dysphagia. These impairments are limiting patient from safety when swallowing. Factors affecting potential to achieve goals and functional outcome are severity of impairments and malnutrition. Patient will benefit from skilled SLP services to address above impairments and improve overall function.  REHAB POTENTIAL: Good  PLAN: SLP FREQUENCY: 1-2x/week  SLP DURATION: 12 weeks  PLANNED INTERVENTIONS: Aspiration precaution training, Pharyngeal strengthening exercises, Diet toleration management , Oral motor exercises, SLP instruction and feedback, Compensatory strategies, and Patient/family education   Railee Bonillas B. Rubbie, M.S., CCC-SLP, CBIS Speech-Language Pathologist Certified Brain Injury Specialist [facility]  [facility] [phone] Ascom [phone] Fax [phone]               "

## 2024-04-19 NOTE — Therapy (Signed)
 " OUTPATIENT PHYSICAL THERAPY TREATMENT   Patient Name: Shawn Lam MRN: [id number] DOB:1951-02-01, 74 y.o., male Today's Date: 04/19/2024  END OF SESSION:  PT End of Session - 04/19/24 0844     Visit Number 22    Number of Visits 27    Date for Recertification  04/30/24    Progress Note Due on Visit 30    PT Start Time 0844    PT Stop Time 0924    PT Time Calculation (min) 40 min    Equipment Utilized During Treatment Gait belt    Activity Tolerance Patient tolerated treatment well    Behavior During Therapy [facility] for tasks assessed/performed         Past Medical History:  Diagnosis Date   Actinic keratosis 02/04/2021   right lateral neck   Anemia    Anxiety    Asymptomatic LV dysfunction    50-55% by echo 06/2016   BCC (basal cell carcinoma of skin) 08/05/2020   left neck infra auricular - nodular pattern - treated with [facility] 09/17/2020   Berger's disease    Blood transfusion without reported diagnosis 04/11/2018   CAD (coronary artery disease) 06/2005   PCI of LAD and RCA   Cancer (HCC)    Cataract 04/11/2016   04/11/2016   Depression    Dilated aortic root    4cm at sinus of Valsalva   Hematuria    Hyperlipidemia    Hypothyroidism    Insomnia    Left ureteral stone    Orthostatic hypotension    negative tilt table   OSA (obstructive sleep apnea)    moderate with AHI 17/hr, uses CPAP nightly(01/15/19 - has Inspire implant)   Osteoporosis 04/11/2016   PVC's (premature ventricular contractions) 07/07/2016   Renal disorder    PT IS SEEING DR. BETSEY- NEPHROLOGIST   Rosacea    Sleep apnea 04/11/2016   04/11/2016   Ulcer 04/11/2018   Urticaria    Past Surgical History:  Procedure Laterality Date   ANTERIOR CERVICAL DECOMP/DISCECTOMY FUSION N/A 03/13/2023   Procedure: CERVICAL FOUR-FIVE, CERVICAL FIVE-SIX ANTERIOR CERVICAL DISCECTOMY/DECOMPRESSION FUSION;  Surgeon: Onetha Kuba, MD;  Location: [facility] OR;  Service: Neurosurgery;  Laterality: N/A;  3C   CARDIAC  CATHETERIZATION  09/06/2013   [facility]   CARDIAC CATHETERIZATION     COLONOSCOPY WITH PROPOFOL  N/A 01/21/2019   Procedure: COLONOSCOPY WITH PROPOFOL ;  Surgeon: Jinny Carmine, MD;  Location: [facility];  Service: Endoscopy;  Laterality: N/A;  sleep apnea   COLONOSCOPY WITH PROPOFOL  N/A 10/19/2021   Procedure: COLONOSCOPY WITH PROPOFOL ;  Surgeon: Jinny Carmine, MD;  Location: [facility] ENDOSCOPY;  Service: Endoscopy;  Laterality: N/A;   CORONARY ANGIOPLASTY  2004   STENT PLACEMENT   CYSTOSCOPY WITH RETROGRADE PYELOGRAM, URETEROSCOPY AND STENT PLACEMENT Left 03/19/2014   Procedure: CYSTOSCOPY WITH RETROGRADE PYELOGRAM, URETEROSCOPY AND STENT PLACEMENT;  Surgeon: Mickey Ricardo KATHEE Alvaro, MD;  Location: [facility] ORS;  Service: Urology;  Laterality: Left;   CYSTOSCOPY WITH RETROGRADE PYELOGRAM, URETEROSCOPY AND STENT PLACEMENT Bilateral 05/20/2015   Procedure:  CYSTOSCOPY WITH BILATERAL RETROGRADE PYELOGRAM,RIGHT  DIAGNOSTIC URETEROSCOPY ,LEFT URETEROSCOPY WITH HOLMIUM LASER  AND BILATERAL  STENT PLACEMENT ;  Surgeon: Ricardo Alvaro, MD;  Location: [facility];  Service: Urology;  Laterality: Bilateral;   DRUG INDUCED ENDOSCOPY N/A 10/20/2017   Procedure: DRUG INDUCED SLEEP ENDOSCOPY;  Surgeon: Carlie Clark, MD;  Location: [facility];  Service: ENT;  Laterality: N/A;   EP IMPLANTABLE DEVICE N/A 05/13/2016   Procedure: Loop Recorder Insertion;  Surgeon: Elspeth JAYSON Sage, MD;  Location: [facility] INVASIVE CV LAB;  Service: Cardiovascular;  Laterality: N/A;   ESOPHAGOGASTRODUODENOSCOPY N/A 10/19/2021   Procedure: ESOPHAGOGASTRODUODENOSCOPY (EGD);  Surgeon: Jinny Carmine, MD;  Location: [facility] ENDOSCOPY;  Service: Endoscopy;  Laterality: N/A;   ESOPHAGOGASTRODUODENOSCOPY (EGD) WITH PROPOFOL  N/A 11/30/2018   Procedure: ESOPHAGOGASTRODUODENOSCOPY (EGD) WITH PROPOFOL ;  Surgeon: Therisa Bi, MD;  Location: [facility] ENDOSCOPY;  Service: Gastroenterology;  Laterality: N/A;   ESOPHAGOGASTRODUODENOSCOPY (EGD) WITH PROPOFOL  N/A  12/01/2018   Procedure: ESOPHAGOGASTRODUODENOSCOPY (EGD) WITH PROPOFOL ;  Surgeon: Jinny Carmine, MD;  Location: [facility] ENDOSCOPY;  Service: Endoscopy;  Laterality: N/A;   HOLMIUM LASER APPLICATION Bilateral 05/20/2015   Procedure: HOLMIUM LASER APPLICATION;  Surgeon: Ricardo Likens, MD;  Location: [facility];  Service: Urology;  Laterality: Bilateral;   IMPLIMENTATION OF A HYPOGLOSSAL NERVE STIMULATOR  12/08/2017   OSA    IR GASTROSTOMY TUBE MOD SED  01/18/2024   IR RADIOLOGIST EVAL & MGMT  02/13/2024   LEFT HEART CATHETERIZATION WITH CORONARY ANGIOGRAM N/A 09/06/2013   Procedure: LEFT HEART CATHETERIZATION WITH CORONARY ANGIOGRAM;  Surgeon: Victory LELON Claudene DOUGLAS, MD;  Location: [facility] CATH LAB;  Service: Cardiovascular;  Laterality: N/A;   LEFT KNEE CAP  SURGERY ABOUT 40 YRS AGO  ? 1970     SPINE SURGERY  Desk in neck   STONE EXTRACTION WITH BASKET Left 03/19/2014   Procedure: STONE EXTRACTION WITH BASKET;  Surgeon: Mickey Ricardo KATHEE Likens, MD;  Location: [facility];  Service: Urology;  Laterality: Left;   TRANSURETHRAL RESECTION OF PROSTATE N/A 08/25/2021   Procedure: TRANSURETHRAL RESECTION OF THE PROSTATE (TURP);  Surgeon: Likens Ricardo, MD;  Location: [facility];  Service: Urology;  Laterality: N/A;  1 HR   Patient Active Problem List   Diagnosis Date Noted   Presence of heart assist device (HCC) 04/16/2024   Interstitial pulmonary disease, unspecified (HCC) 04/16/2024   Abnormal PET scan of lung 03/29/2024   Gastrointestinal tube present (HCC) 02/18/2024   Granulation tissue of site of gastrostomy 02/06/2024   Protein-calorie malnutrition, severe 01/23/2024   Multifocal pneumonia 01/22/2024   Chronic pulmonary aspiration 12/29/2023   Silent aspiration 12/29/2023   Moderate protein-calorie malnutrition (weight for age 87-74% of standard) 12/29/2023   Bilateral leg weakness 12/29/2023   Chronic constipation 12/29/2023   Somnolence 12/29/2023   Frequent falls 12/29/2023   Feeding difficulties, unspecified  12/20/2023   Shortness of breath 12/20/2023   Abnormal CT of the chest 10/10/2023   Carotid stenosis 05/11/2023   Spinal stenosis in cervical region 03/13/2023   Osteoarthritis of spine with radiculopathy, cervical region 07/14/2022   Cervicogenic headache 11/30/2021   Chronic neck pain 11/30/2021   Weight loss, non-intentional    Dysphagia    Polyp of colon    Prostatic hyperplasia 08/25/2021   Chronic insomnia 03/19/2020   Peripheral neuropathy 03/19/2020   Iron deficiency anemia 01/06/2020   Allergic dermatitis 07/08/2019   Osteoarthritis of left knee 02/26/2019   History of colonic polyps    History of duodenal ulcer    Chronic kidney disease, stage 3b (HCC)    Bilateral chronic knee pain 11/09/2018   Epistaxis 10/02/2018   Subjective tinnitus, bilateral 10/02/2018   Chronic pain of right thumb 02/22/2018   Acute bilateral thoracic back pain 01/23/2018   Osteoporosis without current pathological fracture 02/10/2017   Hypothyroidism 11/02/2016   Thoracic compression fracture (HCC) 09/06/2016   PVC's (premature ventricular contractions) 07/07/2016   Hypoalbuminemia 06/28/2016   IgA nephropathy 06/07/2016   Acute buttock pain 05/23/2016  MDD (major depressive disorder), recurrent episode, moderate (HCC) 11/20/2015   OSA (obstructive sleep apnea) 02/05/2014   Orthostatic hypotension 02/07/2013   Scotoma involving central area 01/29/2013   Optic neuropathy 01/28/2013   HEMATURIA UNSPECIFIED 01/21/2010   HLD (hyperlipidemia) 07/05/2006   Generalized anxiety disorder 07/05/2006   CAD (coronary artery disease) 07/05/2006   GERD 07/05/2006    PCP: Dr. Greig Ring    REFERRING PROVIDER: Dr. Arley Helling   REFERRING DIAG: M54.6 (ICD-10-CM) - Acute bilateral thoracic back pain  THERAPY DIAG:  Abnormality of gait  Balance disorder  Muscle weakness (generalized)  Cervicalgia  Pain in thoracic spine  Rationale for Evaluation and Treatment: Rehabilitation  ONSET DATE:  May 2025    SUBJECTIVE:                                                                                                                                                                                                         SUBJECTIVE STATEMENT  Pt denies any falls since the last visit.  Pt also notes his BP has been better, staying around 110 for the systolic pressure.  PERTINENT HISTORY:   73yoM referred to physical therapy for ongoing neck pain following cervical fusion in May 2020 and newly developed thoracic pain from T12 compression fracture that occurred sometime in Spring of 2025 (associated radicular pain pattern). Pt has pain localized to upper traps and suboccipitals. Pt attempted DC of prednisone  and gabapentin  two weeks prior to evaluation here, severe decline in function due to pain, barely able to walk for two weeks. He describes feeling increased fatigue and weakness to the point where he struggled to walk. Pt also reported repeated falls in the setting of orthostatic hypotension with him experiencing syncope when he stands up from a seated position. He describes feeling a burning sensation that is also localized to his scapulae and that it occurs when he is doing UE activities like washing his car that is likely the radicular pain from thoracic vertebrae compression fracture.  PAIN: Are you having pain? 03/05/2024: 6/10 today in the neck  PRECAUTIONS: None Pt has thoracic compression fractures and is s/p cervical spine surgery   WEIGHT BEARING RESTRICTIONS: No  FALLS:   Has patient fallen in last 6 months? Yes. Number of falls 6, pt reports that falls are happening in the context of syncope when standing up from a seated position (Orthostatic Hypotension) , where he blacks out and falls. He is following up with his PCP, Dr. Karlene who is working with him on stabilizing his blood pressure and she  has recently taken him off Gabapentin  to see if this helps. Pt reports that he does  note an improvement in his symptoms with less light headedness since going off medication.   LIVING ENVIRONMENT: Lives with: lives with their spouse Lives in: House/apartment Stairs: No Has following equipment at home: Single point cane and Environmental Consultant - 4 wheeled  OCCUPATION: Retired     PLOF: Independent  PATIENT GOALS: Improve balance, decrease falls.       OBJECTIVE:   Note: Objective measures were completed at evaluation unless otherwise noted. Reassessment Measures 02/09/24: -eyes closed balance, normal stance: <5sec with retropulsion each time, ModA falls recovery  -narrow stance eyes open balance: <10sec with LOB, ModA falls recovery  -Long distance AMB assessment c SPC: 125ft with 2 DOE standing breaks ~15 seconds each, 15sec; crossover gait intermittently without LOB; -standing vitals taken at end of walk: stable and comparable to prior seated vitals.  -cervical rotation ROM: 50deg Rt, 53 degree left  -shoulder flexion MMT: 5/5 Rt, 4+/5 left; shoulder ABDCT MMT: 5/5 bilat  -ankle DF assessment: can perform >10x full range while leaning against wall (adequate for gait needs)  -sensory testing about ankles, lower legs: (feels intact, but pt reports constance N/T below ankle to his toes)  Standing BP assessment: 02/28/24 -124/36mmHg 95bpm standing @ 0 -122/80mmHg 90bpm standing @ 1 110/38mmHg 92bpm standing @ 2  FUNCTIONAL GAIT ASSESSMENT  Date: 04/12/2024  Score  GAIT LEVEL SURFACE Instructions: Walk at your normal speed from here to the next mark (6 m) [20 ft]. (3) Normal - Walks 6 m (20 ft) in less than 5.5 seconds, no assistive devices, good speed, no evidence for imbalance, normal gait pattern, deviates no more than 15.24 cm (6 in) outside of the 30.48-cm (12-in) walkway width.  2.   CHANGE IN GAIT SPEED Instructions: Begin walking at your normal pace (for 1.5 m [5 ft]). When I tell you go, walk as fast as you can (for 1.5 m [5 ft]). When I tell you slow, walk as  slowly as you can (for 1.5 m [5 ft]. (3) Normal - Able to smoothly change walking speed without loss of balance or gait deviation. Shows a significant difference in walking speeds between normal, fast, and slow speeds. Deviates no more than 15.24 cm (6 in) outside of the 30.48-cm (12-in) walkway width.  3.    GAIT WITH HORIZONTAL HEAD TURNS Instructions: Walk from here to the next mark 6 m (20 ft) away. Begin walking at your normal pace. Keep walking straight; after 3 steps, turn your head to the right and keep walking straight while looking to the right. After 3 more steps, turn your head to the left and keep walking straight while looking left. Continue alternating looking right and left. (3) Normal - Performs head turns smoothly with no change in gait. Deviates no more than 15.24 cm (6 in) outside 30.48-cm (12-in) walkway width.  4.   GAIT WITH VERTICAL HEAD TURNS Instructions: Walk from here to the next mark (6 m [20 ft]). Begin walking at your normal pace. Keep walking straight; after 3 steps, tip your head up and keep walking straight while looking up. After 3 more steps, tip your head down, keep walking straight while looking down. Continue  alternating looking up and down every 3 steps until you have completed 2 repetitions in each direction. (3) Normal - Performs head turns with no change in gait. Deviates no more than 15.24 cm (6 in) outside 30.48-cm (12-in)  walkway width.  5.  GAIT AND PIVOT TURN Instructions: Begin with walking at your normal pace. When I tell you, turn and stop, turn as quickly as you can to face the opposite direction and stop. (3) Normal - Pivot turns safely within 3 seconds and stops quickly with no loss of balance  6.   STEP OVER OBSTACLE Instructions: Begin walking at your normal speed. When you come to the shoe box, step over it, not around it, and keep walking. (3) Normal - Is able to step over 2 stacked shoe boxes taped together (22.86 cm [9 in] total height) without  changing gait speed; no evidence of imbalance.  7.   GAIT WITH NARROW BASE OF SUPPORT Instructions: Walk on the floor with arms folded across the chest, feet aligned heel to toe in tandem for a distance of 3.6 m [12 ft]. The number of steps taken in a straight line are counted for a maximum of 10 steps. (3) Normal - Is able to ambulate for 10 steps heel to toe with no staggering.  8.   GAIT WITH EYES CLOSED Instructions: Walk at your normal speed from here to the next mark (6 m [20 ft]) with your eyes closed. (0) Severe impairment - Cannot walk 6 m (20 ft) without assistance, severe gait deviations or imbalance, deviates greater than 38.1 cm (15 in) outside 30.48-cm (12-in) walkway width or will not attempt task.  9.   AMBULATING BACKWARDS Instructions: Walk backwards until I tell you to stop (3) Normal - Walks 6 m (20 ft), no assistive devices, good speed, no evidence for imbalance, normal gait pattern, deviates no more than 15.24 cm (6 in) outside 30.48-cm (12-in) walkway width.  10. STEPS Instructions: Walk up these stairs as you would at home (ie, using the rail if necessary). At the top turn around and walk down. (2) Mild impairment-Alternating feet, must use rail.  Total 26/30   Interpretation of scores: Non-Specific Older Adults Cutoff Score: <=22/30 = risk of falls Parkinsons Disease Cutoff score <15/30= fall risk (Hoehn & Yahr 1-4)  Minimally Clinically Important Difference (MCID)  Stroke (acute, subacute, and chronic) = MDC: 4.2 points Vestibular (acute) = MDC: 6 points Community Dwelling Older Adults =  MCID: 4 points Parkinsons Disease  =  MDC: 4.3 points  (Academy of Neurologic Physical Therapy (nd). Functional Gait Assessment. Retrieved from https://www.neuropt.org/docs/default-source/cpgs/core-outcome-measures/function-gait-assessment-pocket-guide-proof9-(2).pdf?sfvrsn=b34f35043_0.)    TREATMENT DATE 04/19/2024:     Self Care:  Sitting: 96/67(77) HR 73 BP upon  start Standing: 93/59(71)  HR 78    TA- To improve functional movements patterns for everyday tasks  Nustep 6 min increased resistance from 1-9 pt rates Borg RPE 17/20  VS assessed:  sitting 133.74 HR 73 Standing 101/64 HR 76   Sit to stand 2 x 10  Step up to second step on stairs 2 x 8 bil.   Heel  raises from wedge 2 x 15   Forward lunges with light UE support on rail 2x 10 bil  NMR: To facilitate reeducation of movement, balance, posture, coordination, and/or proprioception/kinesthetic sense.  SLS 5x 15 sec ea with minimal UE support    Forward/reverse tandem at rail on wall 3ft x 6 each with cues for reduced UE support as tolerated on wall. Was noted to perform forward with support, but light hand support in reverse.     PATIENT EDUCATION:  Education details: Pt progressing so far with cardio and balance activity.     Person educated: Patient Education method: Explanation  and Demonstration Education comprehension: verbalized understanding, returned demonstration, and verbal cues required  HOME EXERCISE PROGRAM: Access Code: [email or web address] URL: [email or web address] Date: 04/17/2024 Prepared by: Lonni Gainer  Exercises - Marching Near Counter  - 1 x daily - 3-5 x weekly - 2 sets - 10 reps - 3 sec hold - Single leg balance near counter   - 1 x daily - 3-5 x weekly - 3 sets - 30 second hold - Heel Toe Raises with Counter Support  - 1 x daily - 3-5 x weekly - 2 sets - 15 reps - Sit to Stand with Arms Crossed  - 1 x daily - 3-5 x weekly - 2 sets - 10 reps  ASSESSMENT:  CLINICAL IMPRESSION:   PT treatment focused on functional strengthening and movement patterns to improve safety with community access and increase functional power output. Mild SOB with each intervention on this day requiring multiple seated rest breaks, but able to complete all activities without s/s of orthostatic hypotension. Will continue to progress to discharge from PT services in future  sessions. Pt will continue to benefit from skilled physical therapy intervention to address impairments, improve QOL, and attain therapy goals.      OBJECTIVE IMPAIRMENTS: decreased balance, decreased ROM, decreased strength, hypomobility, impaired flexibility, impaired UE functional use, and pain.   ACTIVITY LIMITATIONS: carrying, lifting, standing, bathing, dressing, reach over head, and hygiene/grooming PARTICIPATION LIMITATIONS: cleaning, laundry, shopping, community activity, and yard work PERSONAL FACTORS: Age and 3+ comorbidities: CAD, Depression, are also affecting patient's functional outcome.  REHAB POTENTIAL: Good CLINICAL DECISION MAKING: Stable/uncomplicated EVALUATION COMPLEXITY: Low   GOALS: Goals reviewed with patient? No  SHORT TERM GOALS: Target date: 01/03/2024  Patient will demonstrate undestanding of home exercise plan by performing exercises correctly with evidence of good carry over with min to no verbal or tactile cues .   Baseline: NT 12/25/23: Performing exercises independently  Goal status: ACHIEVED   2.  Patient will demonstrate understanding of how to modify exercises to decrease thoracic and cervical pain exacerbation for improved function and to decrease incidence of pain flare ups.  Baseline: Does not currently have strategy to manage this  Goal status: NOT MET      LONG TERM GOALS: Target date: 03/13/2024  Patient will show a statistically significant improvement in his neck function as evidence by >=7.5 pt decrease in her neck disability index score to better be able to move neck to improve visual field while driving to avoid accidents. Vernel et al, 2009) Baseline: 33/50 (66%); 02/09/24: 66% 03/01/2024: 50% 04/12/24: 28 (56%) Goal status: NOT MET     2.  Patient will increase cervical mobility by  >=5 degrees AROM for all planes of motion (flexion, extension, side bending, and rotation) for improved cervical function and evidence of pain  reduction. Baseline: Cervical AROM Flex 30, Ext 30, Rot R/L 40/40, SB R/L 35/35; 02/09/24: Rt rotation 50 degrees, Left rotation: 53 degress  03/01/2024: Flex:  30*   Ext: 20*      Rot L: 32*     Rot R: 30*      SB L: 20*       SB R: 15* Goal status: MET    3.  Patient will improve shoulder and periscapular strength by 1/3 grade MMT (4- to 4) for improved cervical stability and UE function to carry out upper extremity activities like washing his car and gardening without being limited by pain and discomfort.  Baseline: Shoulder Flex R/L 4/4, Shoulder Abd R/L  4/4; 10/31: shoulder flexion Rt: 5/5, left 4+/5; shoulder ABDCT 5/5 bilat  03/01/2024: Shld Flex R: 5 Shld Flex L: 4+ , Shld abd R: 4 +  Shld Abd L: 4 Goal status: MET    4.  Patient will reduce neck and thoracic pain to <=4/10 NRPS while performing UE activities like washing his car and doing yard work as evidence of improved cervical and thoracic function.  Baseline: 10/10 cervical paraspinal and rhomboid pain with UE activity; up to a 9/10 intermittently with activity   03/01/2024: 8/10 NPS UE activity 04/12/24: 8/10 NPS with UE activity Goal status: NOT MET    5. Patient (> 77 years old) will complete five times sit to stand test in < 15 seconds indicating an increased LE strength and improved balance. Baseline: 17.28s 04/12/24: 16.09 sec Goal status: INITIAL  6. Patient will increase Berg Balance score by > 6 points to demonstrate decreased fall risk during functional activities Baseline: 47/56 Goal status: INITIAL  7. Patient will increase 10 meter walk test to >1.31m/s as to improve gait speed for better community ambulation and to reduce fall risk. Baseline: normal pace: 0.45m/s; fast pace: 0.975s 04/12/24: normal pace: 0.97 m/s, 10.33 sec; fast pace 1.09 m/s, 9.13 s Goal status: INITIAL  8. Patient will reduce timed up and go to <10 seconds to reduce fall risk and demonstrate improved transfer/gait ability. Baseline:  11.27s 04/12/24: 9.26 sec Goal status: MET  9. Patient will decrease distance of by 154ft in order to increase cardiovascular endurance for improved community ambulation distance. Baseline: 969ft in 6 min w/ [facility] 04/12/24: 1336' in 6 min no AD Goal status: MET  10. Pt will increase score on FGA by 4 points in order to increase functional ambulation and increase confidence in community environments. Baseline: 17/30 04/12/24: 26/30 Goal status: INITIAL   PLAN:  PT FREQUENCY: 1-2x/week PT DURATION: 8 weeks PLANNED INTERVENTIONS: [id number]- PT Re-evaluation, 97750- Physical Performance Testing, 97110-Therapeutic exercises, 97530- Therapeutic activity, 97112- Neuromuscular re-education, 97535- Self Care, [id number]- Manual therapy, [phone]- Gait training, [phone]- Canalith repositioning, [id number]- Aquatic Therapy, [phone]- Electrical stimulation (unattended), [phone]- Electrical stimulation (manual), [id number]- Traction (mechanical), 20560 (1-2 muscles), 20561 (3+ muscles)- Dry Needling, Patient/Family education, Balance training, Joint mobilization, Joint manipulation, Spinal manipulation, Spinal mobilization, Vestibular training, DME instructions, Cryotherapy, and Moist heat  PLAN FOR NEXT SESSION:    -continue to address cervical ROM/pain -address global weakness, deconditioning, and imbalance.      Massie FORBES Dollar PT ,DPT Physical Therapist- [facility]   04/19/2024, 8:45 AM   "

## 2024-04-22 ENCOUNTER — Encounter: Payer: Self-pay | Admitting: Family Medicine

## 2024-04-23 ENCOUNTER — Encounter: Admitting: Speech Pathology

## 2024-04-23 ENCOUNTER — Ambulatory Visit: Admitting: Physical Therapy

## 2024-04-23 ENCOUNTER — Ambulatory Visit

## 2024-04-23 ENCOUNTER — Ambulatory Visit: Admitting: Speech Pathology

## 2024-04-23 DIAGNOSIS — M546 Pain in thoracic spine: Secondary | ICD-10-CM

## 2024-04-23 DIAGNOSIS — M542 Cervicalgia: Secondary | ICD-10-CM

## 2024-04-23 DIAGNOSIS — R1312 Dysphagia, oropharyngeal phase: Secondary | ICD-10-CM

## 2024-04-23 DIAGNOSIS — R269 Unspecified abnormalities of gait and mobility: Secondary | ICD-10-CM

## 2024-04-23 DIAGNOSIS — R2689 Other abnormalities of gait and mobility: Secondary | ICD-10-CM

## 2024-04-23 DIAGNOSIS — M6281 Muscle weakness (generalized): Secondary | ICD-10-CM

## 2024-04-23 NOTE — Therapy (Signed)
 " OUTPATIENT SPEECH LANGUAGE PATHOLOGY  SWALLOW TREATMENT     Patient Name: Shawn Lam MRN: [id number] DOB:1950-05-18, 74 y.o., male Today's Date: 04/23/2024  PCP: Greig Ring, MD REFERRING PROVIDER: Greig Ring, MD    End of Session - 04/23/24 1052     Visit Number 24    Number of Visits 43    Date for Recertification  06/18/24    Authorization Type Medicare Part A    Progress Note Due on Visit 30    SLP Start Time 0845    SLP Stop Time  0920    SLP Time Calculation (min) 35 min    Activity Tolerance Patient tolerated treatment well          Past Medical History:  Diagnosis Date   Actinic keratosis 02/04/2021   right lateral neck   Anemia    Anxiety    Asymptomatic LV dysfunction    50-55% by echo 06/2016   BCC (basal cell carcinoma of skin) 08/05/2020   left neck infra auricular - nodular pattern - treated with [facility] 09/17/2020   Berger's disease    Blood transfusion without reported diagnosis 04/11/2018   CAD (coronary artery disease) 06/2005   PCI of LAD and RCA   Cancer (HCC)    Cataract 04/11/2016   04/11/2016   Depression    Dilated aortic root    4cm at sinus of Valsalva   Hematuria    Hyperlipidemia    Hypothyroidism    Insomnia    Left ureteral stone    Orthostatic hypotension    negative tilt table   OSA (obstructive sleep apnea)    moderate with AHI 17/hr, uses CPAP nightly(01/15/19 - has Inspire implant)   Osteoporosis 04/11/2016   PVC's (premature ventricular contractions) 07/07/2016   Renal disorder    PT IS SEEING DR. BETSEY- NEPHROLOGIST   Rosacea    Sleep apnea 04/11/2016   04/11/2016   Ulcer 04/11/2018   Urticaria    Past Surgical History:  Procedure Laterality Date   ANTERIOR CERVICAL DECOMP/DISCECTOMY FUSION N/A 03/13/2023   Procedure: CERVICAL FOUR-FIVE, CERVICAL FIVE-SIX ANTERIOR CERVICAL DISCECTOMY/DECOMPRESSION FUSION;  Surgeon: Onetha Kuba, MD;  Location: [facility] OR;  Service: Neurosurgery;  Laterality: N/A;  3C   CARDIAC  CATHETERIZATION  09/06/2013   [facility]   CARDIAC CATHETERIZATION     COLONOSCOPY WITH PROPOFOL  N/A 01/21/2019   Procedure: COLONOSCOPY WITH PROPOFOL ;  Surgeon: Jinny Carmine, MD;  Location: [facility];  Service: Endoscopy;  Laterality: N/A;  sleep apnea   COLONOSCOPY WITH PROPOFOL  N/A 10/19/2021   Procedure: COLONOSCOPY WITH PROPOFOL ;  Surgeon: Jinny Carmine, MD;  Location: [facility] ENDOSCOPY;  Service: Endoscopy;  Laterality: N/A;   CORONARY ANGIOPLASTY  2004   STENT PLACEMENT   CYSTOSCOPY WITH RETROGRADE PYELOGRAM, URETEROSCOPY AND STENT PLACEMENT Left 03/19/2014   Procedure: CYSTOSCOPY WITH RETROGRADE PYELOGRAM, URETEROSCOPY AND STENT PLACEMENT;  Surgeon: Mickey Ricardo KATHEE Alvaro, MD;  Location: [facility] ORS;  Service: Urology;  Laterality: Left;   CYSTOSCOPY WITH RETROGRADE PYELOGRAM, URETEROSCOPY AND STENT PLACEMENT Bilateral 05/20/2015   Procedure:  CYSTOSCOPY WITH BILATERAL RETROGRADE PYELOGRAM,RIGHT  DIAGNOSTIC URETEROSCOPY ,LEFT URETEROSCOPY WITH HOLMIUM LASER  AND BILATERAL  STENT PLACEMENT ;  Surgeon: Ricardo Alvaro, MD;  Location: [facility];  Service: Urology;  Laterality: Bilateral;   DRUG INDUCED ENDOSCOPY N/A 10/20/2017   Procedure: DRUG INDUCED SLEEP ENDOSCOPY;  Surgeon: Carlie Clark, MD;  Location: [facility];  Service: ENT;  Laterality: N/A;   EP IMPLANTABLE DEVICE N/A 05/13/2016   Procedure:  Loop Recorder Insertion;  Surgeon: Elspeth JAYSON Sage, MD;  Location: [facility] INVASIVE CV LAB;  Service: Cardiovascular;  Laterality: N/A;   ESOPHAGOGASTRODUODENOSCOPY N/A 10/19/2021   Procedure: ESOPHAGOGASTRODUODENOSCOPY (EGD);  Surgeon: Jinny Carmine, MD;  Location: [facility] ENDOSCOPY;  Service: Endoscopy;  Laterality: N/A;   ESOPHAGOGASTRODUODENOSCOPY (EGD) WITH PROPOFOL  N/A 11/30/2018   Procedure: ESOPHAGOGASTRODUODENOSCOPY (EGD) WITH PROPOFOL ;  Surgeon: Therisa Bi, MD;  Location: [facility] ENDOSCOPY;  Service: Gastroenterology;  Laterality: N/A;   ESOPHAGOGASTRODUODENOSCOPY (EGD) WITH PROPOFOL  N/A  12/01/2018   Procedure: ESOPHAGOGASTRODUODENOSCOPY (EGD) WITH PROPOFOL ;  Surgeon: Jinny Carmine, MD;  Location: [facility] ENDOSCOPY;  Service: Endoscopy;  Laterality: N/A;   HOLMIUM LASER APPLICATION Bilateral 05/20/2015   Procedure: HOLMIUM LASER APPLICATION;  Surgeon: Ricardo Likens, MD;  Location: [facility];  Service: Urology;  Laterality: Bilateral;   IMPLIMENTATION OF A HYPOGLOSSAL NERVE STIMULATOR  12/08/2017   OSA    IR GASTROSTOMY TUBE MOD SED  01/18/2024   IR RADIOLOGIST EVAL & MGMT  02/13/2024   LEFT HEART CATHETERIZATION WITH CORONARY ANGIOGRAM N/A 09/06/2013   Procedure: LEFT HEART CATHETERIZATION WITH CORONARY ANGIOGRAM;  Surgeon: Victory LELON Claudene DOUGLAS, MD;  Location: [facility] CATH LAB;  Service: Cardiovascular;  Laterality: N/A;   LEFT KNEE CAP  SURGERY ABOUT 40 YRS AGO  ? 1970     SPINE SURGERY  Desk in neck   STONE EXTRACTION WITH BASKET Left 03/19/2014   Procedure: STONE EXTRACTION WITH BASKET;  Surgeon: Mickey Ricardo KATHEE Likens, MD;  Location: [facility];  Service: Urology;  Laterality: Left;   TRANSURETHRAL RESECTION OF PROSTATE N/A 08/25/2021   Procedure: TRANSURETHRAL RESECTION OF THE PROSTATE (TURP);  Surgeon: Likens Ricardo, MD;  Location: [facility];  Service: Urology;  Laterality: N/A;  1 HR   Patient Active Problem List   Diagnosis Date Noted   Presence of heart assist device (HCC) 04/16/2024   Interstitial pulmonary disease, unspecified (HCC) 04/16/2024   Abnormal PET scan of lung 03/29/2024   Gastrointestinal tube present (HCC) 02/18/2024   Granulation tissue of site of gastrostomy 02/06/2024   Protein-calorie malnutrition, severe 01/23/2024   Multifocal pneumonia 01/22/2024   Chronic pulmonary aspiration 12/29/2023   Silent aspiration 12/29/2023   Moderate protein-calorie malnutrition (weight for age 53-74% of standard) 12/29/2023   Bilateral leg weakness 12/29/2023   Chronic constipation 12/29/2023   Somnolence 12/29/2023   Frequent falls 12/29/2023   Feeding difficulties, unspecified  12/20/2023   Shortness of breath 12/20/2023   Abnormal CT of the chest 10/10/2023   Carotid stenosis 05/11/2023   Spinal stenosis in cervical region 03/13/2023   Osteoarthritis of spine with radiculopathy, cervical region 07/14/2022   Cervicogenic headache 11/30/2021   Chronic neck pain 11/30/2021   Weight loss, non-intentional    Dysphagia    Polyp of colon    Prostatic hyperplasia 08/25/2021   Chronic insomnia 03/19/2020   Peripheral neuropathy 03/19/2020   Iron deficiency anemia 01/06/2020   Allergic dermatitis 07/08/2019   Osteoarthritis of left knee 02/26/2019   History of colonic polyps    History of duodenal ulcer    Chronic kidney disease, stage 3b (HCC)    Bilateral chronic knee pain 11/09/2018   Epistaxis 10/02/2018   Subjective tinnitus, bilateral 10/02/2018   Chronic pain of right thumb 02/22/2018   Acute bilateral thoracic back pain 01/23/2018   Osteoporosis without current pathological fracture 02/10/2017   Hypothyroidism 11/02/2016   Thoracic compression fracture (HCC) 09/06/2016   PVC's (premature ventricular contractions) 07/07/2016   Hypoalbuminemia 06/28/2016   IgA nephropathy 06/07/2016   Acute buttock  pain 05/23/2016   MDD (major depressive disorder), recurrent episode, moderate (HCC) 11/20/2015   OSA (obstructive sleep apnea) 02/05/2014   Orthostatic hypotension 02/07/2013   Scotoma involving central area 01/29/2013   Optic neuropathy 01/28/2013   HEMATURIA UNSPECIFIED 01/21/2010   HLD (hyperlipidemia) 07/05/2006   Generalized anxiety disorder 07/05/2006   CAD (coronary artery disease) 07/05/2006   GERD 07/05/2006    ONSET DATE: 03/2023 following ACDF; Date of referral 12/28/2023: date of this referral 02/01/2024  REFERRING DIAG:  T17.908D (ICD-10-CM) - Chronic pulmonary aspiration, subsequent encounter  T17.900D (ICD-10-CM) - Silent aspiration, subsequent encounter  E44.0 (ICD-10-CM) - Moderate protein-calorie malnutrition  R13.10 (ICD-10-CM) -  Dysphagia, unspecified type    THERAPY DIAG:  Dysphagia, oropharyngeal phase  Rationale for Evaluation and Treatment Rehabilitation  SUBJECTIVE:   PERTINENT HISTORY and DIAGNOSTIC FINDINGS:  Pt is a 74 yo male w/ ACDF surgery 03/2023.  PMH: GERD(Not on a PPI per chart), Actinic keratosis (02/04/2021), Anemia, Anxiety, Asymptomatic LV dysfunction, BCC (basal cell carcinoma of skin) (08/05/2020), Berger's disease, CAD (coronary artery disease) (06/2005), resting tremor, Depression, Dilated aortic root (HCC), Hematuria, Hyperlipidemia, Hypothyroidism, Insomnia, Left ureteral stone, Orthostatic hypotension, OSA (obstructive sleep apnea), PVC's (premature ventricular contractions) (07/07/2016), Renal disorder, Rosacea, and Urticaria.  Pt is followed by Cardiology for Coronary Artery Stenosis currently; also followed for Migraines.     EGD in 2023 revealed Gastritis.      CT of Chest 10/2023: 1. Interim development of patchy consolidation and ground-glass   disease most evident at the right middle lobe and right lower lobes   with additional areas of mild ground-glass disease and scattered   clustered nodularity, findings are suspicious for multifocal   infection/pneumonia. Imaging follow-up to resolution is recommended.   2. Emphysema.   3. Aortic atherosclerosis.            Dysphagia - reported by pt as Chronic, ever since neck surgery in December 2024. Pt was seen in the emergency room s/p Fall, notes detailing that he slipped on soap in the shower, fell backwards on right side hit head and neck against shower seat, possible brief loss of consciousness, per chart.  ACDF 03/2023 per chart.  Since the neck surgery in 03/2023, he has noted food, liquids and pill getting stuck on the right side of my throat more.  I used to cough a lot more but not as much now.  I cough up what gets stuck..  He has lost significant weight per chart notes due to decreased eating.  Pt endorsed using his home O2 when he  feels SOB.   MBSS 12/26/2023 Patient appears to present w/ Chronic, moderate-severe Pharyngeal phase dysphagia per this assessment today. It appeared there was some impact from changes in the Cervical Esophagus s/p ACDF surgery(03/2023 per chart notes) including structural/tissue changes in/aournd the CP segment and cervical esophagus. Pt with intermittent silent aspiration of thin liquids.   Recommend continue current diet of easy to eat foods at home; thin liquids via cup- at this time. Oral care Before any oral intake. Swallow Strategies Recommended: No straws, moistened foods, SMALL bites/sips, Head Turn RIGHT w/ slight chin tuck, multiple swallows to aid clearing, AND strong Cough/throat clear b/t bites/sips and at end of meal.  D/t the Chronicity of pt's Pharyngeal phase Dysphagia w/ weight loss and pulmonary impact per recent Chest CT, pt and Family may need to discuss alternative means of nutrition/hydration as support w/ his Medical Team/PCP, while seeking swallow therapy.  Received ENT report dated 01/12/2024  Laryngoscopy revealed  Moderate vocal fold bowing bilaterally and moderate glottic gamp with phonation Impression/plan: Aspiration on MBSS and significant weight loss since ACDF last year. TVF shows moderate sized glottic gap on laryngoscoy but mobile vocal folds bilaterally. Pooling in pyriform on MBSS but no significant pooling of secretions on laryngoscopy. I feel that between speech therpay and possibly bilateral injection laryngoplasty, hopefully his swallowing will significantly improve. Will make referral to Dr Brien at [facility].   Pt and his wife report appt with Dr Brien in January 2026.  10/10/205 PEG placed  01/22/2024 thru 01/24/2024 Hospitalized d/t fall and confusion CT of the chest on arrival showed multifocal pneumonia and possible free air. When reviewed further with interventional radiology team it was determined this was an appropriate amount of free air  secondary to recent PEG tube placement. Patient was initiated on antibiotic therapy for pneumonia and improved significantly.   02/07/2024 CT Chest 1. Evolving multifocal inflammatory infiltrates, progressive within the right upper lobe and improved within the lung bases bilaterally. Recommend appropriate clinical management and imaging follow-up per clinical course. 2. Mild emphysema. Given emphysema as an independent lung cancer risk factor, consider evaluation for a low-dose CT lung cancer screening program if the patient is 74 years old and otherwise eligible.   PAIN:  Are you having pain? No  FALLS: Has patient fallen in last 6 months?  See PT evaluation for details  LIVING ENVIRONMENT: Lives with: lives with their spouse Lives in: House/apartment  PLOF:  Level of assistance: Independent with ADLs, Independent with IADLs Employment: Retired   PATIENT GOALS  to improve swallow function, nutrition and hydration, decrease aspiration risks  SUBJECTIVE STATEMENT: Pt pleasant, reports still at 114lb Pt accompanied by: self   OBJECTIVE:   TODAY'S TREATMENT:  Skilled treatment session focused on pt's dysphagia goals. SLP facilitated the session by providing the following interventions:     Pt reports consuming V8 and orange juice, not highly nutrient rich liquids a previously instructed. This writere provided skilled verbal and written education on using a food diary to better monitor consumption and bring with him to each session.    Skilled observation was provided of pt consuming protein shake during session. Specifically pt with improved ability to consume each bolus with 1 immediate swallow.   He was able to complete 3 sets of 8 reps with EMST set at [facility] and intermittent Min A cues and 3 sets of 8 reps of IMST independently.   Pt was independent with pharyngeal strengthening (chin up, masako) 3 sets of 8 reps.     PATIENT EDUCATION: Education details: as  above Person educated: Patient and Spouse Education method: Chief Technology Officer Education comprehension: needs further education  HOME EXERCISE PROGRAM:     See above   GOALS: Goals reviewed with patient? Yes   SHORT TERM GOALS: Target date: 10 sessions            Updated 02/28/2024            Updated 02/09/2024            Update 03/26/2024 With Mod I, pt will perform compensatory swallow strategies (chin tuck, multiple swallows, head turn) to eliminate s/s of aspiration when consuming least restrictive diet. Baseline: Goal status: MET: no overt s/sx with water  consumption this date   2.  With Min A, pt will complete (supraglottic, swallow, Mednelson maneuver, effortful swallow) to improve oral motor weakness, tongue base retraction, hyolarnygeal excursion, airway protection and/or clearance of the  bolus through the pharynx. Baseline:  Goal status: IN PROGRESS: progress made; moderate A: Progress made, fluctuating between supervision and independent cues   3. Pt will demonstrate overt s/s of aspiration when consuming ice chips.              Baseline: Goal status: IN PROGRESSING: no overt s/s of aspiration observed during today's session: Intermittent overt s/s observed     LONG TERM GOALS: Target date: 06/18/2024             Updated 02/28/2024             Updated: 02/09/2024             Updated: 03/26/2024    With Mod I, pt will demonstrate the ability to adequately self-monitor swallowing skills and perform appropriate compensatory techniques to reduce s/s of aspiration and to safely consume least restrictive diet.     Baseline:  Goal status: IN PROGRESS: continues to be appropriate goal   2.  Pt will participate in repeat instrumental swallow evaluation to assess least restrictive diet.  Baseline:  Goal status: IN PROGRESS: continues to be appropriate goal: Modified Barium scheduled completed 11/21; would benefit from repeat MBSS  ASSESSMENT:  CLINICAL  IMPRESSION: Patient is a 74 y.o. male who was seen today for a clinical swallow re-evaluation d/t severe pharyngeal phase dysphagia following ACDF (03/2023).    See the above treatment note for details.   OBJECTIVE IMPAIRMENTS include dysphagia. These impairments are limiting patient from safety when swallowing. Factors affecting potential to achieve goals and functional outcome are severity of impairments and malnutrition. Patient will benefit from skilled SLP services to address above impairments and improve overall function.  REHAB POTENTIAL: Good  PLAN: SLP FREQUENCY: 1-2x/week  SLP DURATION: 12 weeks  PLANNED INTERVENTIONS: Aspiration precaution training, Pharyngeal strengthening exercises, Diet toleration management , Oral motor exercises, SLP instruction and feedback, Compensatory strategies, and Patient/family education   Alexi Geibel B. Rubbie, M.S., CCC-SLP, CBIS Speech-Language Pathologist Certified Brain Injury Specialist [facility]  [facility] [place] [phone] Ascom [phone] Fax [phone]               "

## 2024-04-23 NOTE — Therapy (Signed)
 " OUTPATIENT PHYSICAL THERAPY TREATMENT   Patient Name: Shawn Lam MRN: [id number] DOB:[birth year], 74 y.o., male Today's Date: 04/23/2024  END OF SESSION:  PT End of Session - 04/23/24 0852     Number of Visits 27    Date for Recertification  04/30/24    Progress Note Due on Visit 30    PT Start Time 0850    PT Stop Time 0930    PT Time Calculation (min) 40 min    Equipment Utilized During Treatment Gait belt    Activity Tolerance Patient tolerated treatment well    Behavior During Therapy [facility] for tasks assessed/performed         Past Medical History:  Diagnosis Date   Actinic keratosis 02/04/2021   right lateral neck   Anemia    Anxiety    Asymptomatic LV dysfunction    50-55% by echo 06/2016   BCC (basal cell carcinoma of skin) 08/05/2020   left neck infra auricular - nodular pattern - treated with [facility] 09/17/2020   Berger's disease    Blood transfusion without reported diagnosis 04/11/2018   CAD (coronary artery disease) 06/2005   PCI of LAD and RCA   Cancer (HCC)    Cataract 04/11/2016   04/11/2016   Depression    Dilated aortic root    4cm at sinus of Valsalva   Hematuria    Hyperlipidemia    Hypothyroidism    Insomnia    Left ureteral stone    Orthostatic hypotension    negative tilt table   OSA (obstructive sleep apnea)    moderate with AHI 17/hr, uses CPAP nightly(01/15/19 - has Inspire implant)   Osteoporosis 04/11/2016   PVC's (premature ventricular contractions) 07/07/2016   Renal disorder    PT IS SEEING DR. BETSEY- NEPHROLOGIST   Rosacea    Sleep apnea 04/11/2016   04/11/2016   Ulcer 04/11/2018   Urticaria    Past Surgical History:  Procedure Laterality Date   ANTERIOR CERVICAL DECOMP/DISCECTOMY FUSION N/A 03/13/2023   Procedure: CERVICAL FOUR-FIVE, CERVICAL FIVE-SIX ANTERIOR CERVICAL DISCECTOMY/DECOMPRESSION FUSION;  Surgeon: Onetha Kuba, MD;  Location: [facility] OR;  Service: Neurosurgery;  Laterality: N/A;  3C   CARDIAC CATHETERIZATION   09/06/2013   [facility]   CARDIAC CATHETERIZATION     COLONOSCOPY WITH PROPOFOL  N/A 01/21/2019   Procedure: COLONOSCOPY WITH PROPOFOL ;  Surgeon: Jinny Carmine, MD;  Location: [facility];  Service: Endoscopy;  Laterality: N/A;  sleep apnea   COLONOSCOPY WITH PROPOFOL  N/A 10/19/2021   Procedure: COLONOSCOPY WITH PROPOFOL ;  Surgeon: Jinny Carmine, MD;  Location: [facility] ENDOSCOPY;  Service: Endoscopy;  Laterality: N/A;   CORONARY ANGIOPLASTY  2004   STENT PLACEMENT   CYSTOSCOPY WITH RETROGRADE PYELOGRAM, URETEROSCOPY AND STENT PLACEMENT Left 03/19/2014   Procedure: CYSTOSCOPY WITH RETROGRADE PYELOGRAM, URETEROSCOPY AND STENT PLACEMENT;  Surgeon: Mickey Ricardo KATHEE Alvaro, MD;  Location: [facility] ORS;  Service: Urology;  Laterality: Left;   CYSTOSCOPY WITH RETROGRADE PYELOGRAM, URETEROSCOPY AND STENT PLACEMENT Bilateral 05/20/2015   Procedure:  CYSTOSCOPY WITH BILATERAL RETROGRADE PYELOGRAM,RIGHT  DIAGNOSTIC URETEROSCOPY ,LEFT URETEROSCOPY WITH HOLMIUM LASER  AND BILATERAL  STENT PLACEMENT ;  Surgeon: Ricardo Alvaro, MD;  Location: [facility] ORS;  Service: Urology;  Laterality: Bilateral;   DRUG INDUCED ENDOSCOPY N/A 10/20/2017   Procedure: DRUG INDUCED SLEEP ENDOSCOPY;  Surgeon: Carlie Clark, MD;  Location:  SURGERY CENTER;  Service: ENT;  Laterality: N/A;   EP IMPLANTABLE DEVICE N/A 05/13/2016   Procedure: Loop Recorder Insertion;  Surgeon: Elspeth JAYSON Sage, MD;  Location: [facility] INVASIVE CV LAB;  Service: Cardiovascular;  Laterality: N/A;   ESOPHAGOGASTRODUODENOSCOPY N/A 10/19/2021   Procedure: ESOPHAGOGASTRODUODENOSCOPY (EGD);  Surgeon: Jinny Carmine, MD;  Location: [facility] ENDOSCOPY;  Service: Endoscopy;  Laterality: N/A;   ESOPHAGOGASTRODUODENOSCOPY (EGD) WITH PROPOFOL  N/A 11/30/2018   Procedure: ESOPHAGOGASTRODUODENOSCOPY (EGD) WITH PROPOFOL ;  Surgeon: Therisa Bi, MD;  Location: [facility] ENDOSCOPY;  Service: Gastroenterology;  Laterality: N/A;   ESOPHAGOGASTRODUODENOSCOPY (EGD) WITH PROPOFOL  N/A 12/01/2018    Procedure: ESOPHAGOGASTRODUODENOSCOPY (EGD) WITH PROPOFOL ;  Surgeon: Jinny Carmine, MD;  Location: [facility] ENDOSCOPY;  Service: Endoscopy;  Laterality: N/A;   HOLMIUM LASER APPLICATION Bilateral 05/20/2015   Procedure: HOLMIUM LASER APPLICATION;  Surgeon: Ricardo Likens, MD;  Location: [facility] ORS;  Service: Urology;  Laterality: Bilateral;   IMPLIMENTATION OF A HYPOGLOSSAL NERVE STIMULATOR  12/08/2017   OSA    IR GASTROSTOMY TUBE MOD SED  01/18/2024   IR RADIOLOGIST EVAL & MGMT  02/13/2024   LEFT HEART CATHETERIZATION WITH CORONARY ANGIOGRAM N/A 09/06/2013   Procedure: LEFT HEART CATHETERIZATION WITH CORONARY ANGIOGRAM;  Surgeon: Victory LELON Claudene DOUGLAS, MD;  Location: [facility] CATH LAB;  Service: Cardiovascular;  Laterality: N/A;   LEFT KNEE CAP  SURGERY ABOUT 40 YRS AGO  ? 1970     SPINE SURGERY  Desk in neck   STONE EXTRACTION WITH BASKET Left 03/19/2014   Procedure: STONE EXTRACTION WITH BASKET;  Surgeon: Mickey Ricardo KATHEE Likens, MD;  Location: [facility] ORS;  Service: Urology;  Laterality: Left;   TRANSURETHRAL RESECTION OF PROSTATE N/A 08/25/2021   Procedure: TRANSURETHRAL RESECTION OF THE PROSTATE (TURP);  Surgeon: Likens Ricardo, MD;  Location: [facility] ORS;  Service: Urology;  Laterality: N/A;  1 HR   Patient Active Problem List   Diagnosis Date Noted   Presence of heart assist device (HCC) 04/16/2024   Interstitial pulmonary disease, unspecified (HCC) 04/16/2024   Abnormal PET scan of lung 03/29/2024   Gastrointestinal tube present (HCC) 02/18/2024   Granulation tissue of site of gastrostomy 02/06/2024   Protein-calorie malnutrition, severe 01/23/2024   Multifocal pneumonia 01/22/2024   Chronic pulmonary aspiration 12/29/2023   Silent aspiration 12/29/2023   Moderate protein-calorie malnutrition (weight for age 48-74% of standard) 12/29/2023   Bilateral leg weakness 12/29/2023   Chronic constipation 12/29/2023   Somnolence 12/29/2023   Frequent falls 12/29/2023   Feeding difficulties, unspecified 12/20/2023    Shortness of breath 12/20/2023   Abnormal CT of the chest 10/10/2023   Carotid stenosis 05/11/2023   Spinal stenosis in cervical region 03/13/2023   Osteoarthritis of spine with radiculopathy, cervical region 07/14/2022   Cervicogenic headache 11/30/2021   Chronic neck pain 11/30/2021   Weight loss, non-intentional    Dysphagia    Polyp of colon    Prostatic hyperplasia 08/25/2021   Chronic insomnia 03/19/2020   Peripheral neuropathy 03/19/2020   Iron deficiency anemia 01/06/2020   Allergic dermatitis 07/08/2019   Osteoarthritis of left knee 02/26/2019   History of colonic polyps    History of duodenal ulcer    Chronic kidney disease, stage 3b (HCC)    Bilateral chronic knee pain 11/09/2018   Epistaxis 10/02/2018   Subjective tinnitus, bilateral 10/02/2018   Chronic pain of right thumb 02/22/2018   Acute bilateral thoracic back pain 01/23/2018   Osteoporosis without current pathological fracture 02/10/2017   Hypothyroidism 11/02/2016   Thoracic compression fracture (HCC) 09/06/2016   PVC's (premature ventricular contractions) 07/07/2016   Hypoalbuminemia 06/28/2016   IgA nephropathy 06/07/2016   Acute buttock pain 05/23/2016   MDD (major depressive disorder), recurrent episode,  moderate (HCC) 11/20/2015   OSA (obstructive sleep apnea) 02/05/2014   Orthostatic hypotension 02/07/2013   Scotoma involving central area 01/29/2013   Optic neuropathy 01/28/2013   HEMATURIA UNSPECIFIED 01/21/2010   HLD (hyperlipidemia) 07/05/2006   Generalized anxiety disorder 07/05/2006   CAD (coronary artery disease) 07/05/2006   GERD 07/05/2006    PCP: Dr. Greig Ring    REFERRING PROVIDER: Dr. Arley Helling   REFERRING DIAG: M54.6 (ICD-10-CM) - Acute bilateral thoracic back pain  THERAPY DIAG:  No diagnosis found.  Rationale for Evaluation and Treatment: Rehabilitation  ONSET DATE: May 2025    SUBJECTIVE:                                                                                                                                                                                                          SUBJECTIVE STATEMENT  Pt denies any falls since the last visit. Has not passed out since last PT visit. States that he does feel a little weak this morning. Upper back and neck pain are 6/10 at worst with mobility.   PERTINENT HISTORY:   73yoM referred to physical therapy for ongoing neck pain following cervical fusion in May 2020 and newly developed thoracic pain from T12 compression fracture that occurred sometime in Spring of 2025 (associated radicular pain pattern). Pt has pain localized to upper traps and suboccipitals. Pt attempted DC of prednisone  and gabapentin  two weeks prior to evaluation here, severe decline in function due to pain, barely able to walk for two weeks. He describes feeling increased fatigue and weakness to the point where he struggled to walk. Pt also reported repeated falls in the setting of orthostatic hypotension with him experiencing syncope when he stands up from a seated position. He describes feeling a burning sensation that is also localized to his scapulae and that it occurs when he is doing UE activities like washing his car that is likely the radicular pain from thoracic vertebrae compression fracture.  PAIN: Are you having pain? 03/05/2024: 6/10 today in the neck  PRECAUTIONS: None Pt has thoracic compression fractures and is s/p cervical spine surgery   WEIGHT BEARING RESTRICTIONS: No  FALLS:   Has patient fallen in last 6 months? Yes. Number of falls 6, pt reports that falls are happening in the context of syncope when standing up from a seated position (Orthostatic Hypotension) , where he blacks out and falls. He is following up with his PCP, Dr. Karlene who is working with him on stabilizing his blood pressure and she has recently taken him off Gabapentin   to see if this helps. Pt reports that he does note an improvement in his symptoms with  less light headedness since going off medication.   LIVING ENVIRONMENT: Lives with: lives with their spouse Lives in: House/apartment Stairs: No Has following equipment at home: Single point cane and Environmental Consultant - 4 wheeled  OCCUPATION: Retired     PLOF: Independent  PATIENT GOALS: Improve balance, decrease falls.       OBJECTIVE:   Note: Objective measures were completed at evaluation unless otherwise noted. Reassessment Measures 02/09/24: -eyes closed balance, normal stance: <5sec with retropulsion each time, ModA falls recovery  -narrow stance eyes open balance: <10sec with LOB, ModA falls recovery  -Long distance AMB assessment c SPC: 137ft with 2 DOE standing breaks ~15 seconds each, 15sec; crossover gait intermittently without LOB; -standing vitals taken at end of walk: stable and comparable to prior seated vitals.  -cervical rotation ROM: 50deg Rt, 53 degree left  -shoulder flexion MMT: 5/5 Rt, 4+/5 left; shoulder ABDCT MMT: 5/5 bilat  -ankle DF assessment: can perform >10x full range while leaning against wall (adequate for gait needs)  -sensory testing about ankles, lower legs: (feels intact, but pt reports constance N/T below ankle to his toes)  Standing BP assessment: 02/28/24 -124/84mmHg 95bpm standing @ 0 -122/70mmHg 90bpm standing @ 1 110/80mmHg 92bpm standing @ 2  FUNCTIONAL GAIT ASSESSMENT  Date: 04/12/2024  Score  GAIT LEVEL SURFACE Instructions: Walk at your normal speed from here to the next mark (6 m) [20 ft]. (3) Normal - Walks 6 m (20 ft) in less than 5.5 seconds, no assistive devices, good speed, no evidence for imbalance, normal gait pattern, deviates no more than 15.24 cm (6 in) outside of the 30.48-cm (12-in) walkway width.  2.   CHANGE IN GAIT SPEED Instructions: Begin walking at your normal pace (for 1.5 m [5 ft]). When I tell you go, walk as fast as you can (for 1.5 m [5 ft]). When I tell you slow, walk as slowly as you can (for 1.5 m [5 ft]. (3)  Normal - Able to smoothly change walking speed without loss of balance or gait deviation. Shows a significant difference in walking speeds between normal, fast, and slow speeds. Deviates no more than 15.24 cm (6 in) outside of the 30.48-cm (12-in) walkway width.  3.    GAIT WITH HORIZONTAL HEAD TURNS Instructions: Walk from here to the next mark 6 m (20 ft) away. Begin walking at your normal pace. Keep walking straight; after 3 steps, turn your head to the right and keep walking straight while looking to the right. After 3 more steps, turn your head to the left and keep walking straight while looking left. Continue alternating looking right and left. (3) Normal - Performs head turns smoothly with no change in gait. Deviates no more than 15.24 cm (6 in) outside 30.48-cm (12-in) walkway width.  4.   GAIT WITH VERTICAL HEAD TURNS Instructions: Walk from here to the next mark (6 m [20 ft]). Begin walking at your normal pace. Keep walking straight; after 3 steps, tip your head up and keep walking straight while looking up. After 3 more steps, tip your head down, keep walking straight while looking down. Continue  alternating looking up and down every 3 steps until you have completed 2 repetitions in each direction. (3) Normal - Performs head turns with no change in gait. Deviates no more than 15.24 cm (6 in) outside 30.48-cm (12-in) walkway width.  5.  GAIT  AND PIVOT TURN Instructions: Begin with walking at your normal pace. When I tell you, turn and stop, turn as quickly as you can to face the opposite direction and stop. (3) Normal - Pivot turns safely within 3 seconds and stops quickly with no loss of balance  6.   STEP OVER OBSTACLE Instructions: Begin walking at your normal speed. When you come to the shoe box, step over it, not around it, and keep walking. (3) Normal - Is able to step over 2 stacked shoe boxes taped together (22.86 cm [9 in] total height) without changing gait speed; no evidence of  imbalance.  7.   GAIT WITH NARROW BASE OF SUPPORT Instructions: Walk on the floor with arms folded across the chest, feet aligned heel to toe in tandem for a distance of 3.6 m [12 ft]. The number of steps taken in a straight line are counted for a maximum of 10 steps. (3) Normal - Is able to ambulate for 10 steps heel to toe with no staggering.  8.   GAIT WITH EYES CLOSED Instructions: Walk at your normal speed from here to the next mark (6 m [20 ft]) with your eyes closed. (0) Severe impairment - Cannot walk 6 m (20 ft) without assistance, severe gait deviations or imbalance, deviates greater than 38.1 cm (15 in) outside 30.48-cm (12-in) walkway width or will not attempt task.  9.   AMBULATING BACKWARDS Instructions: Walk backwards until I tell you to stop (3) Normal - Walks 6 m (20 ft), no assistive devices, good speed, no evidence for imbalance, normal gait pattern, deviates no more than 15.24 cm (6 in) outside 30.48-cm (12-in) walkway width.  10. STEPS Instructions: Walk up these stairs as you would at home (ie, using the rail if necessary). At the top turn around and walk down. (2) Mild impairment-Alternating feet, must use rail.  Total 26/30   Interpretation of scores: Non-Specific Older Adults Cutoff Score: <=22/30 = risk of falls Parkinsons Disease Cutoff score <15/30= fall risk (Hoehn & Yahr 1-4)  Minimally Clinically Important Difference (MCID)  Stroke (acute, subacute, and chronic) = MDC: 4.2 points Vestibular (acute) = MDC: 6 points Community Dwelling Older Adults =  MCID: 4 points Parkinsons Disease  =  MDC: 4.3 points  (Academy of Neurologic Physical Therapy (nd). Functional Gait Assessment. Retrieved from https://www.neuropt.org/docs/default-source/cpgs/core-outcome-measures/function-gait-assessment-pocket-guide-proof9-(2).pdf?sfvrsn=b81f35043_0.)    TREATMENT DATE 04/23/2024:     Self Care:  Sitting: 92/63 HR 82 Standing 0 min: 86/62 HR 87 no s.s  Standing 2 min: 83/65  HR 93 no s/s  Standing 3 min: 83/66 HR 92 no s/s    TA- To improve functional movements patterns for everyday tasks  Nustep 5 min increased resistance from 16 pt rates Borg RPE 17/20  VS assessed:  sitting 110/67 HR 83 Standing  96/68 HR 84  Standing 1 min: 83/67 HR 86 mild/moderately symptomatic  Standing 3 min min 86/66 HR 90 mildly symptomatic.   Due to symptomatic low BP on this day PT treatment focused on thoracic and cervical strength and mobility.   Access Code: [email or web address] URL: [email or web address] Date: 04/23/2024 Prepared by: Massie Dollar  Exercises - mid row RTB x 15  - Seated Bilateral Shoulder Flexion therapy ball roll out   - 10 reps - 3-5 sec hold - Seated Upper Trapezius Stretch 2 reps, 20 sec hold - Seated Thoracic Lumbar Extension over half bolster  -15 reps - Seated Thoracic Extension AROM with half bolster along spine  - 10 reps - Seated Cervical  Rotation AROM  -  10 reps - sit<>stand with 6 # ball press overhead.   PATIENT EDUCATION:  Education details: Pt progressing so far with cardio and balance activity.     Person educated: Patient Education method: Medical Illustrator Education comprehension: verbalized understanding, returned demonstration, and verbal cues required  HOME EXERCISE PROGRAM: Access Code: [email or web address] URL: [email or web address] Date: 04/17/2024 Prepared by: Lonni Gainer  Exercises - Marching Near Counter  - 1 x daily - 3-5 x weekly - 2 sets - 10 reps - 3 sec hold - Single leg balance near counter   - 1 x daily - 3-5 x weekly - 3 sets - 30 second hold - Heel Toe Raises with Counter Support  - 1 x daily - 3-5 x weekly - 2 sets - 15 reps - Sit to Stand with Arms Crossed  - 1 x daily - 3-5 x weekly - 2 sets - 10 reps  Access Code: [email or web address] URL: [email or web address] Date: 04/23/2024 Prepared by: Massie Dollar  Exercises - Seated Bilateral Shoulder Flexion Towel Slide at Table  Top  - 1 x daily - 7 x weekly - 2 sets - 10 reps - 3-5 sec hold - Seated Upper Trapezius Stretch  - 1 x daily - 7 x weekly - 3 sets - 10 reps - Seated Thoracic Lumbar Extension  - 1 x daily - 7 x weekly - 3 sets - 10 reps - Seated Thoracic Extension AROM  - 1 x daily - 7 x weekly - 3 sets - 10 reps - Seated Cervical Rotation AROM  - 1 x daily - 7 x weekly - 3 sets - 10 reps  ASSESSMENT:  CLINICAL IMPRESSION:   PT treatment focused on ROM and strength in thoracic paraspinals and parascapular muscles. Tolerated treatment well and appreciated HEP adjustment for thoracic and cervical ROM. Low BP limits time in standing on this day. Pt reports that he will be calling cardiologist this week to address medication. Pt will continue to benefit from skilled physical therapy intervention to address impairments, improve QOL, and attain therapy goals.      OBJECTIVE IMPAIRMENTS: decreased balance, decreased ROM, decreased strength, hypomobility, impaired flexibility, impaired UE functional use, and pain.   ACTIVITY LIMITATIONS: carrying, lifting, standing, bathing, dressing, reach over head, and hygiene/grooming PARTICIPATION LIMITATIONS: cleaning, laundry, shopping, community activity, and yard work PERSONAL FACTORS: Age and 3+ comorbidities: CAD, Depression, are also affecting patient's functional outcome.  REHAB POTENTIAL: Good CLINICAL DECISION MAKING: Stable/uncomplicated EVALUATION COMPLEXITY: Low   GOALS: Goals reviewed with patient? No  SHORT TERM GOALS: Target date: 01/03/2024  Patient will demonstrate undestanding of home exercise plan by performing exercises correctly with evidence of good carry over with min to no verbal or tactile cues .   Baseline: NT 12/25/23: Performing exercises independently  Goal status: ACHIEVED   2.  Patient will demonstrate understanding of how to modify exercises to decrease thoracic and cervical pain exacerbation for improved function and to decrease  incidence of pain flare ups.  Baseline: Does not currently have strategy to manage this  Goal status: NOT MET      LONG TERM GOALS: Target date: 03/13/2024  Patient will show a statistically significant improvement in his neck function as evidence by >=7.5 pt decrease in her neck disability index score to better be able to move neck to improve visual field while driving to avoid accidents. Vernel et al, 2009) Baseline: 33/50 (66%); 02/09/24: 66% 03/01/2024: 50% 04/12/24: 28 (56%) Goal status: NOT MET  2.  Patient will increase cervical mobility by  >=5 degrees AROM for all planes of motion (flexion, extension, side bending, and rotation) for improved cervical function and evidence of pain reduction. Baseline: Cervical AROM Flex 30, Ext 30, Rot R/L 40/40, SB R/L 35/35; 02/09/24: Rt rotation 50 degrees, Left rotation: 53 degress  03/01/2024: Flex:  30*   Ext: 20*      Rot L: 32*     Rot R: 30*      SB L: 20*       SB R: 15* Goal status: MET    3.  Patient will improve shoulder and periscapular strength by 1/3 grade MMT (4- to 4) for improved cervical stability and UE function to carry out upper extremity activities like washing his car and gardening without being limited by pain and discomfort.  Baseline: Shoulder Flex R/L 4/4, Shoulder Abd R/L 4/4; 10/31: shoulder flexion Rt: 5/5, left 4+/5; shoulder ABDCT 5/5 bilat  03/01/2024: Shld Flex R: 5 Shld Flex L: 4+ , Shld abd R: 4 +  Shld Abd L: 4 Goal status: MET    4.  Patient will reduce neck and thoracic pain to <=4/10 NRPS while performing UE activities like washing his car and doing yard work as evidence of improved cervical and thoracic function.  Baseline: 10/10 cervical paraspinal and rhomboid pain with UE activity; up to a 9/10 intermittently with activity   03/01/2024: 8/10 NPS UE activity 04/12/24: 8/10 NPS with UE activity Goal status: NOT MET    5. Patient (> 60 years old) will complete five times sit to stand test in < 15 seconds  indicating an increased LE strength and improved balance. Baseline: 17.28s 04/12/24: 16.09 sec Goal status: INITIAL  6. Patient will increase Berg Balance score by > 6 points to demonstrate decreased fall risk during functional activities Baseline: 47/56 Goal status: INITIAL  7. Patient will increase 10 meter walk test to >1.67m/s as to improve gait speed for better community ambulation and to reduce fall risk. Baseline: normal pace: 0.69m/s; fast pace: 0.975s 04/12/24: normal pace: 0.97 m/s, 10.33 sec; fast pace 1.09 m/s, 9.13 s Goal status: INITIAL  8. Patient will reduce timed up and go to <10 seconds to reduce fall risk and demonstrate improved transfer/gait ability. Baseline: 11.27s 04/12/24: 9.26 sec Goal status: MET  9. Patient will decrease distance of by 19ft in order to increase cardiovascular endurance for improved community ambulation distance. Baseline: 965ft in 6 min w/ [facility] 04/12/24: 1336' in 6 min no AD Goal status: MET  10. Pt will increase score on FGA by 4 points in order to increase functional ambulation and increase confidence in community environments. Baseline: 17/30 04/12/24: 26/30 Goal status: INITIAL   PLAN:  PT FREQUENCY: 1-2x/week PT DURATION: 8 weeks PLANNED INTERVENTIONS: [id number]- PT Re-evaluation, 97750- Physical Performance Testing, 97110-Therapeutic exercises, 97530- Therapeutic activity, 97112- Neuromuscular re-education, 97535- Self Care, [id number]- Manual therapy, [phone]- Gait training, [phone]- Canalith repositioning, [id number]- Aquatic Therapy, [phone]- Electrical stimulation (unattended), [phone]- Electrical stimulation (manual), [id number]- Traction (mechanical), 20560 (1-2 muscles), 20561 (3+ muscles)- Dry Needling, Patient/Family education, Balance training, Joint mobilization, Joint manipulation, Spinal manipulation, Spinal mobilization, Vestibular training, DME instructions, Cryotherapy, and Moist heat  PLAN FOR NEXT SESSION:    -continue to address cervical  ROM/pain -address global weakness, deconditioning, and imbalance.      Massie FORBES Dollar PT ,DPT Physical Therapist- [facility]   04/23/2024, 8:54 AM   "

## 2024-04-24 ENCOUNTER — Telehealth: Payer: Self-pay | Admitting: Sleep Medicine

## 2024-04-24 NOTE — Telephone Encounter (Signed)
 Copied from CRM [id number]. Topic: Appointments - Scheduling Inquiry for Clinic >> Apr 23, 2024  1:21 PM Shawn Lam wrote: Reason for CRM: Patient is calling to schedule his 6 month [facility] appointment.   Callback number: [phone] Called to schedule 6 month [facility] appt.

## 2024-04-25 ENCOUNTER — Ambulatory Visit

## 2024-04-25 ENCOUNTER — Encounter: Admitting: Speech Pathology

## 2024-04-25 ENCOUNTER — Ambulatory Visit: Admitting: Speech Pathology

## 2024-04-25 ENCOUNTER — Ambulatory Visit: Admitting: Physical Therapy

## 2024-04-25 ENCOUNTER — Other Ambulatory Visit: Payer: Self-pay | Admitting: Emergency Medicine

## 2024-04-25 VITALS — BP 77/59

## 2024-04-25 DIAGNOSIS — M6281 Muscle weakness (generalized): Secondary | ICD-10-CM

## 2024-04-25 DIAGNOSIS — R1313 Dysphagia, pharyngeal phase: Secondary | ICD-10-CM

## 2024-04-25 DIAGNOSIS — R2689 Other abnormalities of gait and mobility: Secondary | ICD-10-CM

## 2024-04-25 DIAGNOSIS — R269 Unspecified abnormalities of gait and mobility: Secondary | ICD-10-CM

## 2024-04-25 DIAGNOSIS — M542 Cervicalgia: Secondary | ICD-10-CM

## 2024-04-25 DIAGNOSIS — M546 Pain in thoracic spine: Secondary | ICD-10-CM

## 2024-04-25 MED ORDER — MIDODRINE HCL 10 MG PO TABS: 10.0000 mg | ORAL_TABLET | Freq: Three times a day (TID) | ORAL | 3 refills | Status: AC | PRN

## 2024-04-25 NOTE — Therapy (Signed)
 " OUTPATIENT SPEECH LANGUAGE PATHOLOGY  SWALLOW TREATMENT     Patient Name: Shawn Lam MRN: [id number] DOB:[birth year], 74 y.o., male Today's Date: 04/25/2024  PCP: Greig Ring, MD REFERRING PROVIDER: Greig Ring, MD    End of Session - 04/25/24 1112     Visit Number 25    Number of Visits 43    Date for Recertification  06/18/24    Authorization Type Medicare Part A    Progress Note Due on Visit 30    SLP Start Time 0845    SLP Stop Time  0920    SLP Time Calculation (min) 35 min    Activity Tolerance Patient tolerated treatment well          Past Medical History:  Diagnosis Date   Actinic keratosis 02/04/2021   right lateral neck   Anemia    Anxiety    Asymptomatic LV dysfunction    50-55% by echo 06/2016   BCC (basal cell carcinoma of skin) 08/05/2020   left neck infra auricular - nodular pattern - treated with [facility] 09/17/2020   Berger's disease    Blood transfusion without reported diagnosis 04/11/2018   CAD (coronary artery disease) 06/2005   PCI of LAD and RCA   Cancer (HCC)    Cataract 04/11/2016   04/11/2016   Depression    Dilated aortic root    4cm at sinus of Valsalva   Hematuria    Hyperlipidemia    Hypothyroidism    Insomnia    Left ureteral stone    Orthostatic hypotension    negative tilt table   OSA (obstructive sleep apnea)    moderate with AHI 17/hr, uses CPAP nightly(01/15/19 - has Inspire implant)   Osteoporosis 04/11/2016   PVC's (premature ventricular contractions) 07/07/2016   Renal disorder    PT IS SEEING DR. BETSEY- NEPHROLOGIST   Rosacea    Sleep apnea 04/11/2016   04/11/2016   Ulcer 04/11/2018   Urticaria    Past Surgical History:  Procedure Laterality Date   ANTERIOR CERVICAL DECOMP/DISCECTOMY FUSION N/A 03/13/2023   Procedure: CERVICAL FOUR-FIVE, CERVICAL FIVE-SIX ANTERIOR CERVICAL DISCECTOMY/DECOMPRESSION FUSION;  Surgeon: Onetha Kuba, MD;  Location: [facility] OR;  Service: Neurosurgery;  Laterality: N/A;  3C   CARDIAC  CATHETERIZATION  09/06/2013   [facility]   CARDIAC CATHETERIZATION     COLONOSCOPY WITH PROPOFOL  N/A 01/21/2019   Procedure: COLONOSCOPY WITH PROPOFOL ;  Surgeon: Jinny Carmine, MD;  Location: [facility];  Service: Endoscopy;  Laterality: N/A;  sleep apnea   COLONOSCOPY WITH PROPOFOL  N/A 10/19/2021   Procedure: COLONOSCOPY WITH PROPOFOL ;  Surgeon: Jinny Carmine, MD;  Location: [facility] ENDOSCOPY;  Service: Endoscopy;  Laterality: N/A;   CORONARY ANGIOPLASTY  2004   STENT PLACEMENT   CYSTOSCOPY WITH RETROGRADE PYELOGRAM, URETEROSCOPY AND STENT PLACEMENT Left 03/19/2014   Procedure: CYSTOSCOPY WITH RETROGRADE PYELOGRAM, URETEROSCOPY AND STENT PLACEMENT;  Surgeon: Mickey Ricardo KATHEE Alvaro, MD;  Location: [facility] ORS;  Service: Urology;  Laterality: Left;   CYSTOSCOPY WITH RETROGRADE PYELOGRAM, URETEROSCOPY AND STENT PLACEMENT Bilateral 05/20/2015   Procedure:  CYSTOSCOPY WITH BILATERAL RETROGRADE PYELOGRAM,RIGHT  DIAGNOSTIC URETEROSCOPY ,LEFT URETEROSCOPY WITH HOLMIUM LASER  AND BILATERAL  STENT PLACEMENT ;  Surgeon: Ricardo Alvaro, MD;  Location: [facility] ORS;  Service: Urology;  Laterality: Bilateral;   DRUG INDUCED ENDOSCOPY N/A 10/20/2017   Procedure: DRUG INDUCED SLEEP ENDOSCOPY;  Surgeon: Carlie Clark, MD;  Location: [facility];  Service: ENT;  Laterality: N/A;   EP IMPLANTABLE DEVICE N/A 05/13/2016   Procedure:  Loop Recorder Insertion;  Surgeon: Elspeth JAYSON Sage, MD;  Location: [facility] INVASIVE CV LAB;  Service: Cardiovascular;  Laterality: N/A;   ESOPHAGOGASTRODUODENOSCOPY N/A 10/19/2021   Procedure: ESOPHAGOGASTRODUODENOSCOPY (EGD);  Surgeon: Jinny Carmine, MD;  Location: [facility] ENDOSCOPY;  Service: Endoscopy;  Laterality: N/A;   ESOPHAGOGASTRODUODENOSCOPY (EGD) WITH PROPOFOL  N/A 11/30/2018   Procedure: ESOPHAGOGASTRODUODENOSCOPY (EGD) WITH PROPOFOL ;  Surgeon: Therisa Bi, MD;  Location: [facility] ENDOSCOPY;  Service: Gastroenterology;  Laterality: N/A;   ESOPHAGOGASTRODUODENOSCOPY (EGD) WITH PROPOFOL  N/A  12/01/2018   Procedure: ESOPHAGOGASTRODUODENOSCOPY (EGD) WITH PROPOFOL ;  Surgeon: Jinny Carmine, MD;  Location: [facility] ENDOSCOPY;  Service: Endoscopy;  Laterality: N/A;   HOLMIUM LASER APPLICATION Bilateral 05/20/2015   Procedure: HOLMIUM LASER APPLICATION;  Surgeon: Ricardo Likens, MD;  Location: [facility]S;  Service: Urology;  Laterality: Bilateral;   IMPLIMENTATION OF A HYPOGLOSSAL NERVE STIMULATOR  12/08/2017   OSA    IR GASTROSTOMY TUBE MOD SED  01/18/2024   IR RADIOLOGIST EVAL & MGMT  02/13/2024   LEFT HEART CATHETERIZATION WITH CORONARY ANGIOGRAM N/A 09/06/2013   Procedure: LEFT HEART CATHETERIZATION WITH CORONARY ANGIOGRAM;  Surgeon: Victory LELON Claudene DOUGLAS, MD;  Location: [facility] CATH LAB;  Service: Cardiovascular;  Laterality: N/A;   LEFT KNEE CAP  SURGERY ABOUT 40 YRS AGO  ? 1970     SPINE SURGERY  Desk in neck   STONE EXTRACTION WITH BASKET Left 03/19/2014   Procedure: STONE EXTRACTION WITH BASKET;  Surgeon: Mickey Ricardo KATHEE Likens, MD;  Location: [facility];  Service: Urology;  Laterality: Left;   TRANSURETHRAL RESECTION OF PROSTATE N/A 08/25/2021   Procedure: TRANSURETHRAL RESECTION OF THE PROSTATE (TURP);  Surgeon: Likens Ricardo, MD;  Location: [facility] ORS;  Service: Urology;  Laterality: N/A;  1 HR   Patient Active Problem List   Diagnosis Date Noted   Presence of heart assist device (HCC) 04/16/2024   Interstitial pulmonary disease, unspecified (HCC) 04/16/2024   Abnormal PET scan of lung 03/29/2024   Gastrointestinal tube present (HCC) 02/18/2024   Granulation tissue of site of gastrostomy 02/06/2024   Protein-calorie malnutrition, severe 01/23/2024   Multifocal pneumonia 01/22/2024   Chronic pulmonary aspiration 12/29/2023   Silent aspiration 12/29/2023   Moderate protein-calorie malnutrition (weight for age 2-74% of standard) 12/29/2023   Bilateral leg weakness 12/29/2023   Chronic constipation 12/29/2023   Somnolence 12/29/2023   Frequent falls 12/29/2023   Feeding difficulties, unspecified  12/20/2023   Shortness of breath 12/20/2023   Abnormal CT of the chest 10/10/2023   Carotid stenosis 05/11/2023   Spinal stenosis in cervical region 03/13/2023   Osteoarthritis of spine with radiculopathy, cervical region 07/14/2022   Cervicogenic headache 11/30/2021   Chronic neck pain 11/30/2021   Weight loss, non-intentional    Dysphagia    Polyp of colon    Prostatic hyperplasia 08/25/2021   Chronic insomnia 03/19/2020   Peripheral neuropathy 03/19/2020   Iron deficiency anemia 01/06/2020   Allergic dermatitis 07/08/2019   Osteoarthritis of left knee 02/26/2019   History of colonic polyps    History of duodenal ulcer    Chronic kidney disease, stage 3b (HCC)    Bilateral chronic knee pain 11/09/2018   Epistaxis 10/02/2018   Subjective tinnitus, bilateral 10/02/2018   Chronic pain of right thumb 02/22/2018   Acute bilateral thoracic back pain 01/23/2018   Osteoporosis without current pathological fracture 02/10/2017   Hypothyroidism 11/02/2016   Thoracic compression fracture (HCC) 09/06/2016   PVC's (premature ventricular contractions) 07/07/2016   Hypoalbuminemia 06/28/2016   IgA nephropathy 06/07/2016   Acute buttock  pain 05/23/2016   MDD (major depressive disorder), recurrent episode, moderate (HCC) 11/20/2015   OSA (obstructive sleep apnea) 02/05/2014   Orthostatic hypotension 02/07/2013   Scotoma involving central area 01/29/2013   Optic neuropathy 01/28/2013   HEMATURIA UNSPECIFIED 01/21/2010   HLD (hyperlipidemia) 07/05/2006   Generalized anxiety disorder 07/05/2006   CAD (coronary artery disease) 07/05/2006   GERD 07/05/2006    ONSET DATE: 03/2023 following ACDF; Date of referral 12/28/2023: date of this referral 02/01/2024  REFERRING DIAG:  T17.908D (ICD-10-CM) - Chronic pulmonary aspiration, subsequent encounter  T17.900D (ICD-10-CM) - Silent aspiration, subsequent encounter  E44.0 (ICD-10-CM) - Moderate protein-calorie malnutrition  R13.10 (ICD-10-CM) -  Dysphagia, unspecified type    THERAPY DIAG:  Dysphagia, pharyngeal phase  Rationale for Evaluation and Treatment Rehabilitation  SUBJECTIVE:   PERTINENT HISTORY and DIAGNOSTIC FINDINGS:  Pt is a 74 yo male w/ ACDF surgery 03/2023.  PMH: GERD(Not on a PPI per chart), Actinic keratosis (02/04/2021), Anemia, Anxiety, Asymptomatic LV dysfunction, BCC (basal cell carcinoma of skin) (08/05/2020), Berger's disease, CAD (coronary artery disease) (06/2005), resting tremor, Depression, Dilated aortic root (HCC), Hematuria, Hyperlipidemia, Hypothyroidism, Insomnia, Left ureteral stone, Orthostatic hypotension, OSA (obstructive sleep apnea), PVC's (premature ventricular contractions) (07/07/2016), Renal disorder, Rosacea, and Urticaria.  Pt is followed by Cardiology for Coronary Artery Stenosis currently; also followed for Migraines.     EGD in 2023 revealed Gastritis.      CT of Chest 10/2023: 1. Interim development of patchy consolidation and ground-glass   disease most evident at the right middle lobe and right lower lobes   with additional areas of mild ground-glass disease and scattered   clustered nodularity, findings are suspicious for multifocal   infection/pneumonia. Imaging follow-up to resolution is recommended.   2. Emphysema.   3. Aortic atherosclerosis.            Dysphagia - reported by pt as Chronic, ever since neck surgery in December 2024. Pt was seen in the emergency room s/p Fall, notes detailing that he slipped on soap in the shower, fell backwards on right side hit head and neck against shower seat, possible brief loss of consciousness, per chart.  ACDF 03/2023 per chart.  Since the neck surgery in 03/2023, he has noted food, liquids and pill getting stuck on the right side of my throat more.  I used to cough a lot more but not as much now.  I cough up what gets stuck..  He has lost significant weight per chart notes due to decreased eating.  Pt endorsed using his home O2 when he  feels SOB.   MBSS 12/26/2023 Patient appears to present w/ Chronic, moderate-severe Pharyngeal phase dysphagia per this assessment today. It appeared there was some impact from changes in the Cervical Esophagus s/p ACDF surgery(03/2023 per chart notes) including structural/tissue changes in/aournd the CP segment and cervical esophagus. Pt with intermittent silent aspiration of thin liquids.   Recommend continue current diet of easy to eat foods at home; thin liquids via cup- at this time. Oral care Before any oral intake. Swallow Strategies Recommended: No straws, moistened foods, SMALL bites/sips, Head Turn RIGHT w/ slight chin tuck, multiple swallows to aid clearing, AND strong Cough/throat clear b/t bites/sips and at end of meal.  D/t the Chronicity of pt's Pharyngeal phase Dysphagia w/ weight loss and pulmonary impact per recent Chest CT, pt and Family may need to discuss alternative means of nutrition/hydration as support w/ his Medical Team/PCP, while seeking swallow therapy.  Received ENT report dated 01/12/2024  Laryngoscopy revealed  Moderate vocal fold bowing bilaterally and moderate glottic gamp with phonation Impression/plan: Aspiration on MBSS and significant weight loss since ACDF last year. TVF shows moderate sized glottic gap on laryngoscoy but mobile vocal folds bilaterally. Pooling in pyriform on MBSS but no significant pooling of secretions on laryngoscopy. I feel that between speech therpay and possibly bilateral injection laryngoplasty, hopefully his swallowing will significantly improve. Will make referral to Dr Brien at [facility].   Pt and his wife report appt with Dr Brien in January 2026.  10/10/205 PEG placed  01/22/2024 thru 01/24/2024 Hospitalized d/t fall and confusion CT of the chest on arrival showed multifocal pneumonia and possible free air. When reviewed further with interventional radiology team it was determined this was an appropriate amount of free air  secondary to recent PEG tube placement. Patient was initiated on antibiotic therapy for pneumonia and improved significantly.   02/07/2024 CT Chest 1. Evolving multifocal inflammatory infiltrates, progressive within the right upper lobe and improved within the lung bases bilaterally. Recommend appropriate clinical management and imaging follow-up per clinical course. 2. Mild emphysema. Given emphysema as an independent lung cancer risk factor, consider evaluation for a low-dose CT lung cancer screening program if the patient is 74 years old and otherwise eligible.   PAIN:  Are you having pain? No  FALLS: Has patient fallen in last 6 months?  See PT evaluation for details  LIVING ENVIRONMENT: Lives with: lives with their spouse Lives in: House/apartment  PLOF:  Level of assistance: Independent with ADLs, Independent with IADLs Employment: Retired   PATIENT GOALS  to improve swallow function, nutrition and hydration, decrease aspiration risks  SUBJECTIVE STATEMENT: Pt pleasant, reports still at 114lb Pt accompanied by: self   OBJECTIVE:   TODAY'S TREATMENT:  Skilled treatment session focused on pt's dysphagia goals. SLP facilitated the session by providing the following interventions:     Pt with increased ability when completing EMST. As a result he was able to increase resistance to one turn past 55 cmH2O with pt able to complete 3 sets of 8 reps independently and 3 sets of 6 reps with IMST.   Pt was independent with pharyngeal strengthening (chin up, masako) 3 sets of 8 reps.     PATIENT EDUCATION: Education details: as above Person educated: Patient and Spouse Education method: Chief Technology Officer Education comprehension: needs further education  HOME EXERCISE PROGRAM:     See above   GOALS: Goals reviewed with patient? Yes   SHORT TERM GOALS: Target date: 10 sessions            Updated 02/28/2024            Updated 02/09/2024            Update  03/26/2024 With Mod I, pt will perform compensatory swallow strategies (chin tuck, multiple swallows, head turn) to eliminate s/s of aspiration when consuming least restrictive diet. Baseline: Goal status: MET: no overt s/sx with water  consumption this date   2.  With Min A, pt will complete (supraglottic, swallow, Mednelson maneuver, effortful swallow) to improve oral motor weakness, tongue base retraction, hyolarnygeal excursion, airway protection and/or clearance of the bolus through the pharynx. Baseline:  Goal status: IN PROGRESS: progress made; moderate A: Progress made, fluctuating between supervision and independent cues   3. Pt will demonstrate overt s/s of aspiration when consuming ice chips.              Baseline: Goal status: IN PROGRESSING: no overt  s/s of aspiration observed during today's session: Intermittent overt s/s observed     LONG TERM GOALS: Target date: 06/18/2024             Updated 02/28/2024             Updated: 02/09/2024             Updated: 03/26/2024    With Mod I, pt will demonstrate the ability to adequately self-monitor swallowing skills and perform appropriate compensatory techniques to reduce s/s of aspiration and to safely consume least restrictive diet.     Baseline:  Goal status: IN PROGRESS: continues to be appropriate goal   2.  Pt will participate in repeat instrumental swallow evaluation to assess least restrictive diet.  Baseline:  Goal status: IN PROGRESS: continues to be appropriate goal: Modified Barium scheduled completed 11/21; would benefit from repeat MBSS  ASSESSMENT:  CLINICAL IMPRESSION: Patient is a 74 y.o. male who was seen today for a clinical swallow re-evaluation d/t severe pharyngeal phase dysphagia following ACDF (03/2023).    See the above treatment note for details.   OBJECTIVE IMPAIRMENTS include dysphagia. These impairments are limiting patient from safety when swallowing. Factors affecting potential to achieve goals  and functional outcome are severity of impairments and malnutrition. Patient will benefit from skilled SLP services to address above impairments and improve overall function.  REHAB POTENTIAL: Good  PLAN: SLP FREQUENCY: 1-2x/week  SLP DURATION: 12 weeks  PLANNED INTERVENTIONS: Aspiration precaution training, Pharyngeal strengthening exercises, Diet toleration management , Oral motor exercises, SLP instruction and feedback, Compensatory strategies, and Patient/family education   Myalynn Lingle B. Rubbie, M.S., CCC-SLP, CBIS Speech-Language Pathologist Certified Brain Injury Specialist [facility]  [facility] [phone] Ascom [phone] Fax [phone]               "

## 2024-04-25 NOTE — Progress Notes (Incomplete)
 Prescription pending for provider's OK.

## 2024-04-25 NOTE — Therapy (Signed)
 " OUTPATIENT PHYSICAL THERAPY TREATMENT   Patient Name: Shawn Lam MRN: [id number] DOB:[birth year], 74 y.o., male Today's Date: 04/25/2024  END OF SESSION:  PT End of Session - 04/25/24 0854     Visit Number 24    Number of Visits 27    Date for Recertification  04/30/24    Progress Note Due on Visit 30    PT Start Time 0850    PT Stop Time 0930    PT Time Calculation (min) 40 min    Equipment Utilized During Treatment Gait belt    Activity Tolerance Patient tolerated treatment well    Behavior During Therapy [facility] for tasks assessed/performed         Past Medical History:  Diagnosis Date   Actinic keratosis 02/04/2021   right lateral neck   Anemia    Anxiety    Asymptomatic LV dysfunction    50-55% by echo 06/2016   BCC (basal cell carcinoma of skin) 08/05/2020   left neck infra auricular - nodular pattern - treated with [facility] 09/17/2020   Berger's disease    Blood transfusion without reported diagnosis 04/11/2018   CAD (coronary artery disease) 06/2005   PCI of LAD and RCA   Cancer (HCC)    Cataract 04/11/2016   04/11/2016   Depression    Dilated aortic root    4cm at sinus of Valsalva   Hematuria    Hyperlipidemia    Hypothyroidism    Insomnia    Left ureteral stone    Orthostatic hypotension    negative tilt table   OSA (obstructive sleep apnea)    moderate with AHI 17/hr, uses CPAP nightly(01/15/19 - has Inspire implant)   Osteoporosis 04/11/2016   PVC's (premature ventricular contractions) 07/07/2016   Renal disorder    PT IS SEEING DR. BETSEY- NEPHROLOGIST   Rosacea    Sleep apnea 04/11/2016   04/11/2016   Ulcer 04/11/2018   Urticaria    Past Surgical History:  Procedure Laterality Date   ANTERIOR CERVICAL DECOMP/DISCECTOMY FUSION N/A 03/13/2023   Procedure: CERVICAL FOUR-FIVE, CERVICAL FIVE-SIX ANTERIOR CERVICAL DISCECTOMY/DECOMPRESSION FUSION;  Surgeon: Onetha Kuba, MD;  Location: [facility] OR;  Service: Neurosurgery;  Laterality: N/A;  3C   CARDIAC  CATHETERIZATION  09/06/2013   [facility]   CARDIAC CATHETERIZATION     COLONOSCOPY WITH PROPOFOL  N/A 01/21/2019   Procedure: COLONOSCOPY WITH PROPOFOL ;  Surgeon: Jinny Carmine, MD;  Location: [facility];  Service: Endoscopy;  Laterality: N/A;  sleep apnea   COLONOSCOPY WITH PROPOFOL  N/A 10/19/2021   Procedure: COLONOSCOPY WITH PROPOFOL ;  Surgeon: Jinny Carmine, MD;  Location: [facility] ENDOSCOPY;  Service: Endoscopy;  Laterality: N/A;   CORONARY ANGIOPLASTY  2004   STENT PLACEMENT   CYSTOSCOPY WITH RETROGRADE PYELOGRAM, URETEROSCOPY AND STENT PLACEMENT Left 03/19/2014   Procedure: CYSTOSCOPY WITH RETROGRADE PYELOGRAM, URETEROSCOPY AND STENT PLACEMENT;  Surgeon: Mickey Ricardo KATHEE Alvaro, MD;  Location: [facility] ORS;  Service: Urology;  Laterality: Left;   CYSTOSCOPY WITH RETROGRADE PYELOGRAM, URETEROSCOPY AND STENT PLACEMENT Bilateral 05/20/2015   Procedure:  CYSTOSCOPY WITH BILATERAL RETROGRADE PYELOGRAM,RIGHT  DIAGNOSTIC URETEROSCOPY ,LEFT URETEROSCOPY WITH HOLMIUM LASER  AND BILATERAL  STENT PLACEMENT ;  Surgeon: Ricardo Alvaro, MD;  Location: [facility] ORS;  Service: Urology;  Laterality: Bilateral;   DRUG INDUCED ENDOSCOPY N/A 10/20/2017   Procedure: DRUG INDUCED SLEEP ENDOSCOPY;  Surgeon: Carlie Clark, MD;  Location: [facility];  Service: ENT;  Laterality: N/A;   EP IMPLANTABLE DEVICE N/A 05/13/2016   Procedure: Loop Recorder Insertion;  Surgeon: Elspeth JAYSON Sage, MD;  Location: [facility] INVASIVE CV LAB;  Service: Cardiovascular;  Laterality: N/A;   ESOPHAGOGASTRODUODENOSCOPY N/A 10/19/2021   Procedure: ESOPHAGOGASTRODUODENOSCOPY (EGD);  Surgeon: Jinny Carmine, MD;  Location: [facility] ENDOSCOPY;  Service: Endoscopy;  Laterality: N/A;   ESOPHAGOGASTRODUODENOSCOPY (EGD) WITH PROPOFOL  N/A 11/30/2018   Procedure: ESOPHAGOGASTRODUODENOSCOPY (EGD) WITH PROPOFOL ;  Surgeon: Therisa Bi, MD;  Location: [facility] ENDOSCOPY;  Service: Gastroenterology;  Laterality: N/A;   ESOPHAGOGASTRODUODENOSCOPY (EGD) WITH PROPOFOL  N/A  12/01/2018   Procedure: ESOPHAGOGASTRODUODENOSCOPY (EGD) WITH PROPOFOL ;  Surgeon: Jinny Carmine, MD;  Location: [facility] ENDOSCOPY;  Service: Endoscopy;  Laterality: N/A;   HOLMIUM LASER APPLICATION Bilateral 05/20/2015   Procedure: HOLMIUM LASER APPLICATION;  Surgeon: Ricardo Likens, MD;  Location: [facility] ORS;  Service: Urology;  Laterality: Bilateral;   IMPLIMENTATION OF A HYPOGLOSSAL NERVE STIMULATOR  12/08/2017   OSA    IR GASTROSTOMY TUBE MOD SED  01/18/2024   IR RADIOLOGIST EVAL & MGMT  02/13/2024   LEFT HEART CATHETERIZATION WITH CORONARY ANGIOGRAM N/A 09/06/2013   Procedure: LEFT HEART CATHETERIZATION WITH CORONARY ANGIOGRAM;  Surgeon: Victory LELON Claudene DOUGLAS, MD;  Location: [facility] CATH LAB;  Service: Cardiovascular;  Laterality: N/A;   LEFT KNEE CAP  SURGERY ABOUT 40 YRS AGO  ? 1970     SPINE SURGERY  Desk in neck   STONE EXTRACTION WITH BASKET Left 03/19/2014   Procedure: STONE EXTRACTION WITH BASKET;  Surgeon: Mickey Ricardo KATHEE Likens, MD;  Location: [facility] ORS;  Service: Urology;  Laterality: Left;   TRANSURETHRAL RESECTION OF PROSTATE N/A 08/25/2021   Procedure: TRANSURETHRAL RESECTION OF THE PROSTATE (TURP);  Surgeon: Likens Ricardo, MD;  Location: [facility] OR[facility];  Service: Urology;  Laterality: N/A;  1 HR   Patient Active Problem List   Diagnosis Date Noted   Presence of heart assist device (HCC) 04/16/2024   Interstitial pulmonary disease, unspecified (HCC) 04/16/2024   Abnormal PET scan of lung 03/29/2024   Gastrointestinal tube present (HCC) 02/18/2024   Granulation tissue of site of gastrostomy 02/06/2024   Protein-calorie malnutrition, severe 01/23/2024   Multifocal pneumonia 01/22/2024   Chronic pulmonary aspiration 12/29/2023   Silent aspiration 12/29/2023   Moderate protein-calorie malnutrition (weight for age 73-74% of standard) 12/29/2023   Bilateral leg weakness 12/29/2023   Chronic constipation 12/29/2023   Somnolence 12/29/2023   Frequent falls 12/29/2023   Feeding difficulties, unspecified  12/20/2023   Shortness of breath 12/20/2023   Abnormal CT of the chest 10/10/2023   Carotid stenosis 05/11/2023   Spinal stenosis in cervical region 03/13/2023   Osteoarthritis of spine with radiculopathy, cervical region 07/14/2022   Cervicogenic headache 11/30/2021   Chronic neck pain 11/30/2021   Weight loss, non-intentional    Dysphagia    Polyp of colon    Prostatic hyperplasia 08/25/2021   Chronic insomnia 03/19/2020   Peripheral neuropathy 03/19/2020   Iron deficiency anemia 01/06/2020   Allergic dermatitis 07/08/2019   Osteoarthritis of left knee 02/26/2019   History of colonic polyps    History of duodenal ulcer    Chronic kidney disease, stage 3b (HCC)    Bilateral chronic knee pain 11/09/2018   Epistaxis 10/02/2018   Subjective tinnitus, bilateral 10/02/2018   Chronic pain of right thumb 02/22/2018   Acute bilateral thoracic back pain 01/23/2018   Osteoporosis without current pathological fracture 02/10/2017   Hypothyroidism 11/02/2016   Thoracic compression fracture (HCC) 09/06/2016   PVC's (premature ventricular contractions) 07/07/2016   Hypoalbuminemia 06/28/2016   IgA nephropathy 06/07/2016   Acute buttock pain 05/23/2016  MDD (major depressive disorder), recurrent episode, moderate (HCC) 11/20/2015   OSA (obstructive sleep apnea) 02/05/2014   Orthostatic hypotension 02/07/2013   Scotoma involving central area 01/29/2013   Optic neuropathy 01/28/2013   HEMATURIA UNSPECIFIED 01/21/2010   HLD (hyperlipidemia) 07/05/2006   Generalized anxiety disorder 07/05/2006   CAD (coronary artery disease) 07/05/2006   GERD 07/05/2006    PCP: Dr. Greig Ring    REFERRING PROVIDER: Dr. Arley Helling   REFERRING DIAG: M54.6 (ICD-10-CM) - Acute bilateral thoracic back pain  THERAPY DIAG:  Abnormality of gait  Balance disorder  Muscle weakness (generalized)  Cervicalgia  Pain in thoracic spine  Rationale for Evaluation and Treatment: Rehabilitation  ONSET DATE:  May 2025    SUBJECTIVE:                                                                                                                                                                                                         SUBJECTIVE STATEMENT  Pt denies any falls since the last visit. Has not passed out since last PT visit. Reports that MD has increased BP medication(Midodrine ) to 10mg  3 times per day . States that he is feeling a little dizzy this AM  PERTINENT HISTORY:   73yoM referred to physical therapy for ongoing neck pain following cervical fusion in May 2020 and newly developed thoracic pain from T12 compression fracture that occurred sometime in Spring of 2025 (associated radicular pain pattern). Pt has pain localized to upper traps and suboccipitals. Pt attempted DC of prednisone  and gabapentin  two weeks prior to evaluation here, severe decline in function due to pain, barely able to walk for two weeks. He describes feeling increased fatigue and weakness to the point where he struggled to walk. Pt also reported repeated falls in the setting of orthostatic hypotension with him experiencing syncope when he stands up from a seated position. He describes feeling a burning sensation that is also localized to his scapulae and that it occurs when he is doing UE activities like washing his car that is likely the radicular pain from thoracic vertebrae compression fracture.  PAIN: Are you having pain? 03/05/2024: 6/10 today in the neck  PRECAUTIONS: None Pt has thoracic compression fractures and is s/p cervical spine surgery   WEIGHT BEARING RESTRICTIONS: No  FALLS:   Has patient fallen in last 6 months? Yes. Number of falls 6, pt reports that falls are happening in the context of syncope when standing up from a seated position (Orthostatic Hypotension) , where he blacks out and falls. He is following up with  his PCP, Dr. Karlene who is working with him on stabilizing his blood pressure and she  has recently taken him off Gabapentin  to see if this helps. Pt reports that he does note an improvement in his symptoms with less light headedness since going off medication.   LIVING ENVIRONMENT: Lives with: lives with their spouse Lives in: House/apartment Stairs: No Has following equipment at home: Single point cane and Environmental Consultant - 4 wheeled  OCCUPATION: Retired     PLOF: Independent  PATIENT GOALS: Improve balance, decrease falls.       OBJECTIVE:   Note: Objective measures were completed at evaluation unless otherwise noted. Reassessment Measures 02/09/24: -eyes closed balance, normal stance: <5sec with retropulsion each time, ModA falls recovery  -narrow stance eyes open balance: <10sec with LOB, ModA falls recovery  -Long distance AMB assessment c SPC: 165ft with 2 DOE standing breaks ~15 seconds each, 15sec; crossover gait intermittently without LOB; -standing vitals taken at end of walk: stable and comparable to prior seated vitals.  -cervical rotation ROM: 50deg Rt, 53 degree left  -shoulder flexion MMT: 5/5 Rt, 4+/5 left; shoulder ABDCT MMT: 5/5 bilat  -ankle DF assessment: can perform >10x full range while leaning against wall (adequate for gait needs)  -sensory testing about ankles, lower legs: (feels intact, but pt reports constance N/T below ankle to his toes)  Standing BP assessment: 02/28/24 -124/58mmHg 95bpm standing @ 0 -122/62mmHg 90bpm standing @ 1 110/65mmHg 92bpm standing @ 2  FUNCTIONAL GAIT ASSESSMENT  Date: 04/12/2024  Score  GAIT LEVEL SURFACE Instructions: Walk at your normal speed from here to the next mark (6 m) [20 ft]. (3) Normal - Walks 6 m (20 ft) in less than 5.5 seconds, no assistive devices, good speed, no evidence for imbalance, normal gait pattern, deviates no more than 15.24 cm (6 in) outside of the 30.48-cm (12-in) walkway width.  2.   CHANGE IN GAIT SPEED Instructions: Begin walking at your normal pace (for 1.5 m [5 ft]). When I tell you  go, walk as fast as you can (for 1.5 m [5 ft]). When I tell you slow, walk as slowly as you can (for 1.5 m [5 ft]. (3) Normal - Able to smoothly change walking speed without loss of balance or gait deviation. Shows a significant difference in walking speeds between normal, fast, and slow speeds. Deviates no more than 15.24 cm (6 in) outside of the 30.48-cm (12-in) walkway width.  3.    GAIT WITH HORIZONTAL HEAD TURNS Instructions: Walk from here to the next mark 6 m (20 ft) away. Begin walking at your normal pace. Keep walking straight; after 3 steps, turn your head to the right and keep walking straight while looking to the right. After 3 more steps, turn your head to the left and keep walking straight while looking left. Continue alternating looking right and left. (3) Normal - Performs head turns smoothly with no change in gait. Deviates no more than 15.24 cm (6 in) outside 30.48-cm (12-in) walkway width.  4.   GAIT WITH VERTICAL HEAD TURNS Instructions: Walk from here to the next mark (6 m [20 ft]). Begin walking at your normal pace. Keep walking straight; after 3 steps, tip your head up and keep walking straight while looking up. After 3 more steps, tip your head down, keep walking straight while looking down. Continue  alternating looking up and down every 3 steps until you have completed 2 repetitions in each direction. (3) Normal - Performs head turns  with no change in gait. Deviates no more than 15.24 cm (6 in) outside 30.48-cm (12-in) walkway width.  5.  GAIT AND PIVOT TURN Instructions: Begin with walking at your normal pace. When I tell you, turn and stop, turn as quickly as you can to face the opposite direction and stop. (3) Normal - Pivot turns safely within 3 seconds and stops quickly with no loss of balance  6.   STEP OVER OBSTACLE Instructions: Begin walking at your normal speed. When you come to the shoe box, step over it, not around it, and keep walking. (3) Normal - Is able to step  over 2 stacked shoe boxes taped together (22.86 cm [9 in] total height) without changing gait speed; no evidence of imbalance.  7.   GAIT WITH NARROW BASE OF SUPPORT Instructions: Walk on the floor with arms folded across the chest, feet aligned heel to toe in tandem for a distance of 3.6 m [12 ft]. The number of steps taken in a straight line are counted for a maximum of 10 steps. (3) Normal - Is able to ambulate for 10 steps heel to toe with no staggering.  8.   GAIT WITH EYES CLOSED Instructions: Walk at your normal speed from here to the next mark (6 m [20 ft]) with your eyes closed. (0) Severe impairment - Cannot walk 6 m (20 ft) without assistance, severe gait deviations or imbalance, deviates greater than 38.1 cm (15 in) outside 30.48-cm (12-in) walkway width or will not attempt task.  9.   AMBULATING BACKWARDS Instructions: Walk backwards until I tell you to stop (3) Normal - Walks 6 m (20 ft), no assistive devices, good speed, no evidence for imbalance, normal gait pattern, deviates no more than 15.24 cm (6 in) outside 30.48-cm (12-in) walkway width.  10. STEPS Instructions: Walk up these stairs as you would at home (ie, using the rail if necessary). At the top turn around and walk down. (2) Mild impairment-Alternating feet, must use rail.  Total 26/30   Interpretation of scores: Non-Specific Older Adults Cutoff Score: <=22/30 = risk of falls Parkinsons Disease Cutoff score <15/30= fall risk (Hoehn & Yahr 1-4)  Minimally Clinically Important Difference (MCID)  Stroke (acute, subacute, and chronic) = MDC: 4.2 points Vestibular (acute) = MDC: 6 points Community Dwelling Older Adults =  MCID: 4 points Parkinsons Disease  =  MDC: 4.3 points  (Academy of Neurologic Physical Therapy (nd). Functional Gait Assessment. Retrieved from https://www.neuropt.org/docs/default-source/cpgs/core-outcome-measures/function-gait-assessment-pocket-guide-proof9-(2).pdf?sfvrsn=b17f35043_0.)    TREATMENT  DATE 04/25/24:     Self Care: Orthostatis VS assessed   Sitting: 97/63(72) HR 81 asymptomatic  Standing 0 min: 82/50(60) HR 89  mildly symptomatic  Standing 2 min 80/62 (69) HR 84  mildly symptomatic   Due to continued Low BP, PT treatment focused on cervical and upper back tightness/pain.   Manual:  STM for TrP management and improved muscle extensibility in the Upper traps, parascapular muscles, Cervical paraspinals   x 15 min total   Supine therex:  Wand flexion with stretch in full flexion  5 sec hold x 10  Bil Shoulder ER RTB x  Serratus activation with shoulder flexion and lateral RTB resistance x 8  Open book x 8 bil    Orthostatic VS re-assessed:  Sitting:  0 min 77/59(66) HR 81 symptomatic  2 min 88/65(74) HR 80 mildly symptomatic  Standing:  0 min 87/63 HR 89 mildly symptomatic  3 min 88/70 HR 89   mildly symptomatic   Pt left clinic ambulatory  with only mild s/s per pt report.   PATIENT EDUCATION:  Education details: Pt progressing so far with cardio and balance activity.     Dangers of prolonged low BP.   Person educated: Patient Education method: Medical Illustrator Education comprehension: verbalized understanding, returned demonstration, and verbal cues required  HOME EXERCISE PROGRAM: Access Code: [email or web address] URL: [email or web address] Date: 04/17/2024 Prepared by: Lonni Gainer  Exercises - Marching Near Counter  - 1 x daily - 3-5 x weekly - 2 sets - 10 reps - 3 sec hold - Single leg balance near counter   - 1 x daily - 3-5 x weekly - 3 sets - 30 second hold - Heel Toe Raises with Counter Support  - 1 x daily - 3-5 x weekly - 2 sets - 15 reps - Sit to Stand with Arms Crossed  - 1 x daily - 3-5 x weekly - 2 sets - 10 reps  Access Code: [email or web address] URL: [email or web address] Date: 04/23/2024 Prepared by: Massie Dollar  Exercises - Seated Bilateral Shoulder Flexion Towel Slide at Table Top  - 1 x daily - 7 x  weekly - 2 sets - 10 reps - 3-5 sec hold - Seated Upper Trapezius Stretch  - 1 x daily - 7 x weekly - 3 sets - 10 reps - Seated Thoracic Lumbar Extension  - 1 x daily - 7 x weekly - 3 sets - 10 reps - Seated Thoracic Extension AROM  - 1 x daily - 7 x weekly - 3 sets - 10 reps - Seated Cervical Rotation AROM  - 1 x daily - 7 x weekly - 3 sets - 10 reps  ASSESSMENT:  CLINICAL IMPRESSION:   PT treatment focused on ROM and strength in thoracic paraspinals and parascapular muscles. Tolerated treatment well and reports decreased upper back/shoulder tightness upon completion. Low BP again, limits time in standing on this day. States that he will let cardiology know what BP measurements have been.  Pt will continue to benefit from skilled physical therapy intervention to address impairments, improve QOL, and attain therapy goals.      OBJECTIVE IMPAIRMENTS: decreased balance, decreased ROM, decreased strength, hypomobility, impaired flexibility, impaired UE functional use, and pain.   ACTIVITY LIMITATIONS: carrying, lifting, standing, bathing, dressing, reach over head, and hygiene/grooming PARTICIPATION LIMITATIONS: cleaning, laundry, shopping, community activity, and yard work PERSONAL FACTORS: Age and 3+ comorbidities: CAD, Depression, are also affecting patient's functional outcome.  REHAB POTENTIAL: Good CLINICAL DECISION MAKING: Stable/uncomplicated EVALUATION COMPLEXITY: Low   GOALS: Goals reviewed with patient? No  SHORT TERM GOALS: Target date: 01/03/2024  Patient will demonstrate undestanding of home exercise plan by performing exercises correctly with evidence of good carry over with min to no verbal or tactile cues .   Baseline: NT 12/25/23: Performing exercises independently  Goal status: ACHIEVED   2.  Patient will demonstrate understanding of how to modify exercises to decrease thoracic and cervical pain exacerbation for improved function and to decrease incidence of pain flare  ups.  Baseline: Does not currently have strategy to manage this  Goal status: NOT MET      LONG TERM GOALS: Target date: 03/13/2024  Patient will show a statistically significant improvement in his neck function as evidence by >=7.5 pt decrease in her neck disability index score to better be able to move neck to improve visual field while driving to avoid accidents. Vernel et al, 2009) Baseline: 33/50 (66%); 02/09/24: 66% 03/01/2024: 50% 04/12/24: 28 (56%) Goal status: NOT MET  2.  Patient will increase cervical mobility by  >=5 degrees AROM for all planes of motion (flexion, extension, side bending, and rotation) for improved cervical function and evidence of pain reduction. Baseline: Cervical AROM Flex 30, Ext 30, Rot R/L 40/40, SB R/L 35/35; 02/09/24: Rt rotation 50 degrees, Left rotation: 53 degress  03/01/2024: Flex:  30*   Ext: 20*      Rot L: 32*     Rot R: 30*      SB L: 20*       SB R: 15* Goal status: MET    3.  Patient will improve shoulder and periscapular strength by 1/3 grade MMT (4- to 4) for improved cervical stability and UE function to carry out upper extremity activities like washing his car and gardening without being limited by pain and discomfort.  Baseline: Shoulder Flex R/L 4/4, Shoulder Abd R/L 4/4; 10/31: shoulder flexion Rt: 5/5, left 4+/5; shoulder ABDCT 5/5 bilat  03/01/2024: Shld Flex R: 5 Shld Flex L: 4+ , Shld abd R: 4 +  Shld Abd L: 4 Goal status: MET    4.  Patient will reduce neck and thoracic pain to <=4/10 NRPS while performing UE activities like washing his car and doing yard work as evidence of improved cervical and thoracic function.  Baseline: 10/10 cervical paraspinal and rhomboid pain with UE activity; up to a 9/10 intermittently with activity   03/01/2024: 8/10 NPS UE activity 04/12/24: 8/10 NPS with UE activity Goal status: NOT MET    5. Patient (> 48 years old) will complete five times sit to stand test in < 15 seconds indicating an increased LE  strength and improved balance. Baseline: 17.28s 04/12/24: 16.09 sec Goal status: INITIAL  6. Patient will increase Berg Balance score by > 6 points to demonstrate decreased fall risk during functional activities Baseline: 47/56 Goal status: INITIAL  7. Patient will increase 10 meter walk test to >1.67m/s as to improve gait speed for better community ambulation and to reduce fall risk. Baseline: normal pace: 0.52m/s; fast pace: 0.975s 04/12/24: normal pace: 0.97 m/s, 10.33 sec; fast pace 1.09 m/s, 9.13 s Goal status: INITIAL  8. Patient will reduce timed up and go to <10 seconds to reduce fall risk and demonstrate improved transfer/gait ability. Baseline: 11.27s 04/12/24: 9.26 sec Goal status: MET  9. Patient will decrease distance of by 163ft in order to increase cardiovascular endurance for improved community ambulation distance. Baseline: 958ft in 6 min w/ [facility] 04/12/24: 1336' in 6 min no AD Goal status: MET  10. Pt will increase score on FGA by 4 points in order to increase functional ambulation and increase confidence in community environments. Baseline: 17/30 04/12/24: 26/30 Goal status: INITIAL   PLAN:  PT FREQUENCY: 1-2x/week PT DURATION: 8 weeks PLANNED INTERVENTIONS: [id number]- PT Re-evaluation, 97750- Physical Performance Testing, 97110-Therapeutic exercises, 97530- Therapeutic activity, 97112- Neuromuscular re-education, 97535- Self Care, [id number]- Manual therapy, [phone]- Gait training, [phone]- Canalith repositioning, [id number]- Aquatic Therapy, [phone]- Electrical stimulation (unattended), [phone]- Electrical stimulation (manual), [id number]- Traction (mechanical), 20560 (1-2 muscles), 20561 (3+ muscles)- Dry Needling, Patient/Family education, Balance training, Joint mobilization, Joint manipulation, Spinal manipulation, Spinal mobilization, Vestibular training, DME instructions, Cryotherapy, and Moist heat  PLAN FOR NEXT SESSION:    -continue to address cervical ROM/pain -address global  weakness, deconditioning, and imbalance.      Massie FORBES Dollar PT ,DPT Physical Therapist- [facility]   04/25/24, 8:55 AM   "

## 2024-04-28 ENCOUNTER — Other Ambulatory Visit: Payer: Self-pay | Admitting: Family Medicine

## 2024-04-29 ENCOUNTER — Encounter: Payer: Self-pay | Admitting: Family Medicine

## 2024-04-30 ENCOUNTER — Ambulatory Visit

## 2024-04-30 ENCOUNTER — Ambulatory Visit: Admitting: Speech Pathology

## 2024-04-30 ENCOUNTER — Encounter: Admitting: Speech Pathology

## 2024-04-30 ENCOUNTER — Ambulatory Visit: Admitting: Physical Therapy

## 2024-04-30 VITALS — BP 84/54

## 2024-04-30 DIAGNOSIS — M6281 Muscle weakness (generalized): Secondary | ICD-10-CM

## 2024-04-30 DIAGNOSIS — R269 Unspecified abnormalities of gait and mobility: Secondary | ICD-10-CM

## 2024-04-30 DIAGNOSIS — R1313 Dysphagia, pharyngeal phase: Secondary | ICD-10-CM

## 2024-04-30 DIAGNOSIS — M546 Pain in thoracic spine: Secondary | ICD-10-CM

## 2024-04-30 DIAGNOSIS — R2689 Other abnormalities of gait and mobility: Secondary | ICD-10-CM

## 2024-04-30 DIAGNOSIS — M542 Cervicalgia: Secondary | ICD-10-CM

## 2024-04-30 NOTE — Therapy (Signed)
 " OUTPATIENT PHYSICAL THERAPY TREATMENT/ Re-certification   Patient Name: Shawn Lam MRN: [id number] DOB:1950/07/13, 74 y.o., male Today's Date: 04/30/2024  END OF SESSION:  PT End of Session - 04/30/24 0851     Visit Number 25    Number of Visits 33    Date for Recertification  05/28/24    Progress Note Due on Visit 30    PT Start Time 0846    PT Stop Time 0930    PT Time Calculation (min) 44 min    Equipment Utilized During Treatment Gait belt    Activity Tolerance Patient tolerated treatment well    Behavior During Therapy [facility] for tasks assessed/performed         Past Medical History:  Diagnosis Date   Actinic keratosis 02/04/2021   right lateral neck   Anemia    Anxiety    Asymptomatic LV dysfunction    50-55% by echo 06/2016   BCC (basal cell carcinoma of skin) 08/05/2020   left neck infra auricular - nodular pattern - treated with [facility] 09/17/2020   Berger's disease    Blood transfusion without reported diagnosis 04/11/2018   CAD (coronary artery disease) 06/2005   PCI of LAD and RCA   Cancer (HCC)    Cataract 04/11/2016   04/11/2016   Depression    Dilated aortic root    4cm at sinus of Valsalva   Hematuria    Hyperlipidemia    Hypothyroidism    Insomnia    Left ureteral stone    Orthostatic hypotension    negative tilt table   OSA (obstructive sleep apnea)    moderate with AHI 17/hr, uses CPAP nightly(01/15/19 - has Inspire implant)   Osteoporosis 04/11/2016   PVC's (premature ventricular contractions) 07/07/2016   Renal disorder    PT IS SEEING DR. BETSEY- NEPHROLOGIST   Rosacea    Sleep apnea 04/11/2016   04/11/2016   Ulcer 04/11/2018   Urticaria    Past Surgical History:  Procedure Laterality Date   ANTERIOR CERVICAL DECOMP/DISCECTOMY FUSION N/A 03/13/2023   Procedure: CERVICAL FOUR-FIVE, CERVICAL FIVE-SIX ANTERIOR CERVICAL DISCECTOMY/DECOMPRESSION FUSION;  Surgeon: Onetha Kuba, MD;  Location: [facility] OR;  Service: Neurosurgery;  Laterality: N/A;   3C   CARDIAC CATHETERIZATION  09/06/2013   [facility]   CARDIAC CATHETERIZATION     COLONOSCOPY WITH PROPOFOL  N/A 01/21/2019   Procedure: COLONOSCOPY WITH PROPOFOL ;  Surgeon: Jinny Carmine, MD;  Location: [facility];  Service: Endoscopy;  Laterality: N/A;  sleep apnea   COLONOSCOPY WITH PROPOFOL  N/A 10/19/2021   Procedure: COLONOSCOPY WITH PROPOFOL ;  Surgeon: Jinny Carmine, MD;  Location: [facility] ENDOSCOPY;  Service: Endoscopy;  Laterality: N/A;   CORONARY ANGIOPLASTY  2004   STENT PLACEMENT   CYSTOSCOPY WITH RETROGRADE PYELOGRAM, URETEROSCOPY AND STENT PLACEMENT Left 03/19/2014   Procedure: CYSTOSCOPY WITH RETROGRADE PYELOGRAM, URETEROSCOPY AND STENT PLACEMENT;  Surgeon: Mickey Ricardo KATHEE Alvaro, MD;  Location: [facility] ORS;  Service: Urology;  Laterality: Left;   CYSTOSCOPY WITH RETROGRADE PYELOGRAM, URETEROSCOPY AND STENT PLACEMENT Bilateral 05/20/2015   Procedure:  CYSTOSCOPY WITH BILATERAL RETROGRADE PYELOGRAM,RIGHT  DIAGNOSTIC URETEROSCOPY ,LEFT URETEROSCOPY WITH HOLMIUM LASER  AND BILATERAL  STENT PLACEMENT ;  Surgeon: Ricardo Alvaro, MD;  Location: [facility];  Service: Urology;  Laterality: Bilateral;   DRUG INDUCED ENDOSCOPY N/A 10/20/2017   Procedure: DRUG INDUCED SLEEP ENDOSCOPY;  Surgeon: Carlie Clark, MD;  Location: [facility];  Service: ENT;  Laterality: N/A;   EP IMPLANTABLE DEVICE N/A 05/13/2016   Procedure: Loop Recorder  Insertion;  Surgeon: Elspeth JAYSON Sage, MD;  Location: [facility] INVASIVE CV LAB;  Service: Cardiovascular;  Laterality: N/A;   ESOPHAGOGASTRODUODENOSCOPY N/A 10/19/2021   Procedure: ESOPHAGOGASTRODUODENOSCOPY (EGD);  Surgeon: Jinny Carmine, MD;  Location: [facility] ENDOSCOPY;  Service: Endoscopy;  Laterality: N/A;   ESOPHAGOGASTRODUODENOSCOPY (EGD) WITH PROPOFOL  N/A 11/30/2018   Procedure: ESOPHAGOGASTRODUODENOSCOPY (EGD) WITH PROPOFOL ;  Surgeon: Therisa Bi, MD;  Location: [facility] ENDOSCOPY;  Service: Gastroenterology;  Laterality: N/A;   ESOPHAGOGASTRODUODENOSCOPY (EGD) WITH  PROPOFOL  N/A 12/01/2018   Procedure: ESOPHAGOGASTRODUODENOSCOPY (EGD) WITH PROPOFOL ;  Surgeon: Jinny Carmine, MD;  Location: [facility] ENDOSCOPY;  Service: Endoscopy;  Laterality: N/A;   HOLMIUM LASER APPLICATION Bilateral 05/20/2015   Procedure: HOLMIUM LASER APPLICATION;  Surgeon: Ricardo Likens, MD;  Location: [facility];  Service: Urology;  Laterality: Bilateral;   IMPLIMENTATION OF A HYPOGLOSSAL NERVE STIMULATOR  12/08/2017   OSA    IR GASTROSTOMY TUBE MOD SED  01/18/2024   IR RADIOLOGIST EVAL & MGMT  02/13/2024   LEFT HEART CATHETERIZATION WITH CORONARY ANGIOGRAM N/A 09/06/2013   Procedure: LEFT HEART CATHETERIZATION WITH CORONARY ANGIOGRAM;  Surgeon: Victory LELON Claudene DOUGLAS, MD;  Location: [facility] CATH LAB;  Service: Cardiovascular;  Laterality: N/A;   LEFT KNEE CAP  SURGERY ABOUT 40 YRS AGO  ? 1970     SPINE SURGERY  Desk in neck   STONE EXTRACTION WITH BASKET Left 03/19/2014   Procedure: STONE EXTRACTION WITH BASKET;  Surgeon: Mickey Ricardo KATHEE Likens, MD;  Location: [facility];  Service: Urology;  Laterality: Left;   TRANSURETHRAL RESECTION OF PROSTATE N/A 08/25/2021   Procedure: TRANSURETHRAL RESECTION OF THE PROSTATE (TURP);  Surgeon: Likens Ricardo, MD;  Location: [facility];  Service: Urology;  Laterality: N/A;  1 HR   Patient Active Problem List   Diagnosis Date Noted   Presence of heart assist device (HCC) 04/16/2024   Interstitial pulmonary disease, unspecified (HCC) 04/16/2024   Abnormal PET scan of lung 03/29/2024   Gastrointestinal tube present (HCC) 02/18/2024   Granulation tissue of site of gastrostomy 02/06/2024   Protein-calorie malnutrition, severe 01/23/2024   Multifocal pneumonia 01/22/2024   Chronic pulmonary aspiration 12/29/2023   Silent aspiration 12/29/2023   Moderate protein-calorie malnutrition (weight for age 80-74% of standard) 12/29/2023   Bilateral leg weakness 12/29/2023   Chronic constipation 12/29/2023   Somnolence 12/29/2023   Frequent falls 12/29/2023   Feeding  difficulties, unspecified 12/20/2023   Shortness of breath 12/20/2023   Abnormal CT of the chest 10/10/2023   Carotid stenosis 05/11/2023   Spinal stenosis in cervical region 03/13/2023   Osteoarthritis of spine with radiculopathy, cervical region 07/14/2022   Cervicogenic headache 11/30/2021   Chronic neck pain 11/30/2021   Weight loss, non-intentional    Dysphagia    Polyp of colon    Prostatic hyperplasia 08/25/2021   Chronic insomnia 03/19/2020   Peripheral neuropathy 03/19/2020   Iron deficiency anemia 01/06/2020   Allergic dermatitis 07/08/2019   Osteoarthritis of left knee 02/26/2019   History of colonic polyps    History of duodenal ulcer    Chronic kidney disease, stage 3b (HCC)    Bilateral chronic knee pain 11/09/2018   Epistaxis 10/02/2018   Subjective tinnitus, bilateral 10/02/2018   Chronic pain of right thumb 02/22/2018   Acute bilateral thoracic back pain 01/23/2018   Osteoporosis without current pathological fracture 02/10/2017   Hypothyroidism 11/02/2016   Thoracic compression fracture (HCC) 09/06/2016   PVC's (premature ventricular contractions) 07/07/2016   Hypoalbuminemia 06/28/2016   IgA nephropathy 06/07/2016   Acute buttock pain 05/23/2016  MDD (major depressive disorder), recurrent episode, moderate (HCC) 11/20/2015   OSA (obstructive sleep apnea) 02/05/2014   Orthostatic hypotension 02/07/2013   Scotoma involving central area 01/29/2013   Optic neuropathy 01/28/2013   HEMATURIA UNSPECIFIED 01/21/2010   HLD (hyperlipidemia) 07/05/2006   Generalized anxiety disorder 07/05/2006   CAD (coronary artery disease) 07/05/2006   GERD 07/05/2006    PCP: Dr. Greig Ring    REFERRING PROVIDER: Dr. Arley Helling   REFERRING DIAG: M54.6 (ICD-10-CM) - Acute bilateral thoracic back pain  THERAPY DIAG:  Abnormality of gait  Balance disorder  Muscle weakness (generalized)  Cervicalgia  Pain in thoracic spine  Rationale for Evaluation and Treatment:  Rehabilitation  ONSET DATE: May 2025    SUBJECTIVE:                                                                                                                                                                                                         SUBJECTIVE STATEMENT  Pt denies any falls since the last visit. Has not passed out since last PT visit, but states that he is getting dizzy.  Reports that MD has increased BP medication(Midodrine ) to 10mg  3 times per day . States that he is feeling a little dizzy this AM.  Despite continued lightheadedness, pt Reports that his balance has improved.    Feels like the stiffness in his neck is now the greatest limiting mobility factor- outside of continued low BP.   PERTINENT HISTORY:   73yoM referred to physical therapy for ongoing neck pain following cervical fusion in May 2020 and newly developed thoracic pain from T12 compression fracture that occurred sometime in Spring of 2025 (associated radicular pain pattern). Pt has pain localized to upper traps and suboccipitals. Pt attempted DC of prednisone  and gabapentin  two weeks prior to evaluation here, severe decline in function due to pain, barely able to walk for two weeks. He describes feeling increased fatigue and weakness to the point where he struggled to walk. Pt also reported repeated falls in the setting of orthostatic hypotension with him experiencing syncope when he stands up from a seated position. He describes feeling a burning sensation that is also localized to his scapulae and that it occurs when he is doing UE activities like washing his car that is likely the radicular pain from thoracic vertebrae compression fracture.  PAIN: Are you having pain? 03/05/2024: 6/10 today in the neck  PRECAUTIONS: None Pt has thoracic compression fractures and is s/p cervical spine surgery   WEIGHT BEARING RESTRICTIONS: No  FALLS:   Has patient  fallen in last 6 months? Yes. Number of falls 6, pt  reports that falls are happening in the context of syncope when standing up from a seated position (Orthostatic Hypotension) , where he blacks out and falls. He is following up with his PCP, Dr. Karlene who is working with him on stabilizing his blood pressure and she has recently taken him off Gabapentin  to see if this helps. Pt reports that he does note an improvement in his symptoms with less light headedness since going off medication.   LIVING ENVIRONMENT: Lives with: lives with their spouse Lives in: House/apartment Stairs: No Has following equipment at home: Single point cane and Environmental Consultant - 4 wheeled  OCCUPATION: Retired     PLOF: Independent  PATIENT GOALS: Improve balance, decrease falls.       OBJECTIVE:   Note: Objective measures were completed at evaluation unless otherwise noted. Reassessment Measures 02/09/24: -eyes closed balance, normal stance: <5sec with retropulsion each time, ModA falls recovery  -narrow stance eyes open balance: <10sec with LOB, ModA falls recovery  -Long distance AMB assessment c SPC: 189ft with 2 DOE standing breaks ~15 seconds each, 15sec; crossover gait intermittently without LOB; -standing vitals taken at end of walk: stable and comparable to prior seated vitals.  -cervical rotation ROM: 50deg Rt, 53 degree left  -shoulder flexion MMT: 5/5 Rt, 4+/5 left; shoulder ABDCT MMT: 5/5 bilat  -ankle DF assessment: can perform >10x full range while leaning against wall (adequate for gait needs)  -sensory testing about ankles, lower legs: (feels intact, but pt reports constance N/T below ankle to his toes)  Standing BP assessment: 02/28/24 -124/30mmHg 95bpm standing @ 0 -122/45mmHg 90bpm standing @ 1 110/76mmHg 92bpm standing @ 2   Cervical ROM  1/20: Flexion: 30: extension: 30. Rot R: 45, Rot L40: SB L: 20. SB R: 15   TREATMENT DATE 04/30/24:   Sitting 84/54(63) HR 78 standing: 76/54(61) HR 88 Standing 2 min 77/58(62) HR 89 Standing 3 min  71/62(66) HR 91   Due to continued Low BP, PT was unable to re-assess standing balance LTG on this day, to cervical and UE goals re-assessed.  PT treatment focused on cervical and upper back tightness/pain.   Manual:  Seated STM for TrP management and improved muscle extensibility in the Upper traps, parascapular muscles, Cervical paraspinals   x 15 min total   SB assessed following STM:  L 30, R 20   Pt left clinic ambulatory with only mild s/s per pt report.   PATIENT EDUCATION:  Education details: Pt progressing so far with cardio and balance activity.     Dangers of prolonged low BP.   Person educated: Patient Education method: Medical Illustrator Education comprehension: verbalized understanding, returned demonstration, and verbal cues required  HOME EXERCISE PROGRAM: Access Code: [email or web address] URL: [email or web address] Date: 04/17/2024 Prepared by: Lonni Gainer  Exercises - Marching Near Counter  - 1 x daily - 3-5 x weekly - 2 sets - 10 reps - 3 sec hold - Single leg balance near counter   - 1 x daily - 3-5 x weekly - 3 sets - 30 second hold - Heel Toe Raises with Counter Support  - 1 x daily - 3-5 x weekly - 2 sets - 15 reps - Sit to Stand with Arms Crossed  - 1 x daily - 3-5 x weekly - 2 sets - 10 reps  Access Code: [email or web address] URL: [email or web address] Date: 04/23/2024 Prepared by: Massie Dollar  Exercises - Seated Bilateral Shoulder Flexion  Towel Slide at Table Top  - 1 x daily - 7 x weekly - 2 sets - 10 reps - 3-5 sec hold - Seated Upper Trapezius Stretch  - 1 x daily - 7 x weekly - 3 sets - 10 reps - Seated Thoracic Lumbar Extension  - 1 x daily - 7 x weekly - 3 sets - 10 reps - Seated Thoracic Extension AROM  - 1 x daily - 7 x weekly - 3 sets - 10 reps - Seated Cervical Rotation AROM  - 1 x daily - 7 x weekly - 3 sets - 10 reps  ASSESSMENT:  CLINICAL IMPRESSION:   PT treatment focused on ROM and strength in thoracic  paraspinals and parascapular muscles. Tolerated treatment well and reports decreased upper back/shoulder tightness upon completion. Low BP again, limits time in standing on this day. States that he will let cardiology know what BP measurements have been.  PT treatment will transition to increased focus on cervical pain, since pt reports significantly improved balance and mobility over the last few weeks. Patient's condition has the potential to improve in response to therapy. Maximum improvement is yet to be obtained. The anticipated improvement is attainable and reasonable in a generally predictable time. Pt will continue to benefit from skilled physical therapy intervention to address impairments, improve QOL, and attain therapy goals.      OBJECTIVE IMPAIRMENTS: decreased balance, decreased ROM, decreased strength, hypomobility, impaired flexibility, impaired UE functional use, and pain.   ACTIVITY LIMITATIONS: carrying, lifting, standing, bathing, dressing, reach over head, and hygiene/grooming PARTICIPATION LIMITATIONS: cleaning, laundry, shopping, community activity, and yard work PERSONAL FACTORS: Age and 3+ comorbidities: CAD, Depression, are also affecting patient's functional outcome.  REHAB POTENTIAL: Good CLINICAL DECISION MAKING: Stable/uncomplicated EVALUATION COMPLEXITY: Low   GOALS: Goals reviewed with patient? No  SHORT TERM GOALS: Target date: 05/14/2024    Patient will demonstrate undestanding of home exercise plan by performing exercises correctly with evidence of good carry over with min to no verbal or tactile cues .   Baseline: NT 12/25/23: Performing exercises independently  Goal status: ACHIEVED   2.  Patient will demonstrate understanding of how to modify exercises to decrease thoracic and cervical pain exacerbation for improved function and to decrease incidence of pain flare ups.  Baseline: slightly improved. Will continue to address.  Goal status: In progress    LONG TERM GOALS: Target date: 05/28/2024  Patient will show a statistically significant improvement in his neck function as evidence by >=7.5 pt decrease in her neck disability index score to better be able to move neck to improve visual field while driving to avoid accidents. Vernel et al, 2009) Baseline: 33/50 (66%); 02/09/24: 66% 03/01/2024: 50% 04/12/24: 28 (56%) 04/30/24: 31 (61%)  Goal status: NOT MET     2.  Patient will increase cervical mobility by  >=5 degrees AROM for all planes of motion (flexion, extension, side bending, and rotation) for improved cervical function and evidence of pain reduction. Baseline: Cervical AROM Flex 30, Ext 30, Rot R/L 40/40, SB R/L 35/35; 02/09/24: Rt rotation 50 degrees, Left rotation: 53 degress  03/01/2024: Flex:  30*   Ext: 20*      Rot L: 32*     Rot R: 30*      SB L: 20*       SB R: 15* 1/20: Flexion: 30: extension: 30. Rot R: 45, Rot L40: SB L: 20. SB R: 15  Goal status: in progress   3.  Patient will  improve shoulder and periscapular strength by 1/3 grade MMT (4- to 4) for improved cervical stability and UE function to carry out upper extremity activities like washing his car and gardening without being limited by pain and discomfort.  Baseline: Shoulder Flex R/L 4/4, Shoulder Abd R/L 4/4; 10/31: shoulder flexion Rt: 5/5, left 4+/5; shoulder ABDCT 5/5 bilat  03/01/2024: Shld Flex R: 5 Shld Flex L: 4+ , Shld abd R: 4 +  Shld Abd L: 4 Goal status: MET    4.  Patient will reduce neck and thoracic pain to <=4/10 NRPS while performing UE activities like washing his car and doing yard work as evidence of improved cervical and thoracic function.  Baseline: 10/10 cervical paraspinal and rhomboid pain with UE activity; up to a 9/10 intermittently with activity   03/01/2024: 8/10 NPS UE activity 04/12/24: 8/10 NPS with UE activity 04/30/24: 8/10 NPS with UE activity (washing Car; cleaning house) Goal status: NOT MET     PLAN:  PT FREQUENCY:  1-2x/week PT DURATION: 4 weeks PLANNED INTERVENTIONS: [id number]- PT Re-evaluation, 97750- Physical Performance Testing, 97110-Therapeutic exercises, 97530- Therapeutic activity, 97112- Neuromuscular re-education, 97535- Self Care, [id number]- Manual therapy, [id number]- Gait training, [phone]- Canalith repositioning, [id number]- Aquatic Therapy, [id number]- Electrical stimulation (unattended), [phone]- Electrical stimulation (manual), [id number]- Traction (mechanical), 20560 (1-2 muscles), [id number] (3+ muscles)- Dry Needling, Patient/Family education, Balance training, Taping, Joint mobilization, Joint manipulation, Spinal manipulation, Spinal mobilization, Vestibular training, DME instructions, Cryotherapy, and Moist heat  PLAN FOR NEXT SESSION:    -continue to address cervical ROM/pain     Massie FORBES Dollar PT ,DPT Physical Therapist- [facility]   04/30/24, 4:57 PM   "

## 2024-05-01 NOTE — Therapy (Signed)
 " OUTPATIENT SPEECH LANGUAGE PATHOLOGY  SWALLOW TREATMENT     Patient Name: Shawn Lam MRN: [id number] DOB:[birth year], 74 y.o., male Today's Date: 05/01/2024  PCP: Greig Ring, MD REFERRING PROVIDER: Greig Ring, MD    End of Session - 04/30/24 1053     Visit Number 26    Number of Visits 43    Date for Recertification  06/18/24    Authorization Type Medicare Part A    Progress Note Due on Visit 30    SLP Start Time 0930    SLP Stop Time  1005    SLP Time Calculation (min) 35 min    Activity Tolerance Patient tolerated treatment well          Past Medical History:  Diagnosis Date   Actinic keratosis 02/04/2021   right lateral neck   Anemia    Anxiety    Asymptomatic LV dysfunction    50-55% by echo 06/2016   BCC (basal cell carcinoma of skin) 08/05/2020   left neck infra auricular - nodular pattern - treated with [facility] 09/17/2020   Berger's disease    Blood transfusion without reported diagnosis 04/11/2018   CAD (coronary artery disease) 06/2005   PCI of LAD and RCA   Cancer (HCC)    Cataract 04/11/2016   04/11/2016   Depression    Dilated aortic root    4cm at sinus of Valsalva   Hematuria    Hyperlipidemia    Hypothyroidism    Insomnia    Left ureteral stone    Orthostatic hypotension    negative tilt table   OSA (obstructive sleep apnea)    moderate with AHI 17/hr, uses CPAP nightly(01/15/19 - has Inspire implant)   Osteoporosis 04/11/2016   PVC's (premature ventricular contractions) 07/07/2016   Renal disorder    PT IS SEEING DR. BETSEY- NEPHROLOGIST   Rosacea    Sleep apnea 04/11/2016   04/11/2016   Ulcer 04/11/2018   Urticaria    Past Surgical History:  Procedure Laterality Date   ANTERIOR CERVICAL DECOMP/DISCECTOMY FUSION N/A 03/13/2023   Procedure: CERVICAL FOUR-FIVE, CERVICAL FIVE-SIX ANTERIOR CERVICAL DISCECTOMY/DECOMPRESSION FUSION;  Surgeon: Onetha Kuba, MD;  Location: [facility] OR;  Service: Neurosurgery;  Laterality: N/A;  3C   CARDIAC  CATHETERIZATION  09/06/2013   [facility]   CARDIAC CATHETERIZATION     COLONOSCOPY WITH PROPOFOL  N/A 01/21/2019   Procedure: COLONOSCOPY WITH PROPOFOL ;  Surgeon: Jinny Carmine, MD;  Location: [facility];  Service: Endoscopy;  Laterality: N/A;  sleep apnea   COLONOSCOPY WITH PROPOFOL  N/A 10/19/2021   Procedure: COLONOSCOPY WITH PROPOFOL ;  Surgeon: Jinny Carmine, MD;  Location: [facility] ENDOSCOPY;  Service: Endoscopy;  Laterality: N/A;   CORONARY ANGIOPLASTY  2004   STENT PLACEMENT   CYSTOSCOPY WITH RETROGRADE PYELOGRAM, URETEROSCOPY AND STENT PLACEMENT Left 03/19/2014   Procedure: CYSTOSCOPY WITH RETROGRADE PYELOGRAM, URETEROSCOPY AND STENT PLACEMENT;  Surgeon: Mickey Ricardo KATHEE Alvaro, MD;  Location: [facility] ORS;  Service: Urology;  Laterality: Left;   CYSTOSCOPY WITH RETROGRADE PYELOGRAM, URETEROSCOPY AND STENT PLACEMENT Bilateral 05/20/2015   Procedure:  CYSTOSCOPY WITH BILATERAL RETROGRADE PYELOGRAM,RIGHT  DIAGNOSTIC URETEROSCOPY ,LEFT URETEROSCOPY WITH HOLMIUM LASER  AND BILATERAL  STENT PLACEMENT ;  Surgeon: Ricardo Alvaro, MD;  Location: [facility];  Service: Urology;  Laterality: Bilateral;   DRUG INDUCED ENDOSCOPY N/A 10/20/2017   Procedure: DRUG INDUCED SLEEP ENDOSCOPY;  Surgeon: Carlie Clark, MD;  Location: [facility];  Service: ENT;  Laterality: N/A;   EP IMPLANTABLE DEVICE N/A 05/13/2016   Procedure:  Loop Recorder Insertion;  Surgeon: Elspeth JAYSON Sage, MD;  Location: [facility] INVASIVE CV LAB;  Service: Cardiovascular;  Laterality: N/A;   ESOPHAGOGASTRODUODENOSCOPY N/A 10/19/2021   Procedure: ESOPHAGOGASTRODUODENOSCOPY (EGD);  Surgeon: Jinny Carmine, MD;  Location: [facility] ENDOSCOPY;  Service: Endoscopy;  Laterality: N/A;   ESOPHAGOGASTRODUODENOSCOPY (EGD) WITH PROPOFOL  N/A 11/30/2018   Procedure: ESOPHAGOGASTRODUODENOSCOPY (EGD) WITH PROPOFOL ;  Surgeon: Therisa Bi, MD;  Location: [facility] ENDOSCOPY;  Service: Gastroenterology;  Laterality: N/A;   ESOPHAGOGASTRODUODENOSCOPY (EGD) WITH PROPOFOL  N/A  12/01/2018   Procedure: ESOPHAGOGASTRODUODENOSCOPY (EGD) WITH PROPOFOL ;  Surgeon: Jinny Carmine, MD;  Location: [facility] ENDOSCOPY;  Service: Endoscopy;  Laterality: N/A;   HOLMIUM LASER APPLICATION Bilateral 05/20/2015   Procedure: HOLMIUM LASER APPLICATION;  Surgeon: Ricardo Likens, MD;  Location: [facility];  Service: Urology;  Laterality: Bilateral;   IMPLIMENTATION OF A HYPOGLOSSAL NERVE STIMULATOR  12/08/2017   OSA    IR GASTROSTOMY TUBE MOD SED  01/18/2024   IR RADIOLOGIST EVAL & MGMT  02/13/2024   LEFT HEART CATHETERIZATION WITH CORONARY ANGIOGRAM N/A 09/06/2013   Procedure: LEFT HEART CATHETERIZATION WITH CORONARY ANGIOGRAM;  Surgeon: Victory LELON Claudene DOUGLAS, MD;  Location: [facility] CATH LAB;  Service: Cardiovascular;  Laterality: N/A;   LEFT KNEE CAP  SURGERY ABOUT 40 YRS AGO  ? 1970     SPINE SURGERY  Desk in neck   STONE EXTRACTION WITH BASKET Left 03/19/2014   Procedure: STONE EXTRACTION WITH BASKET;  Surgeon: Mickey Ricardo KATHEE Likens, MD;  Location: [facility];  Service: Urology;  Laterality: Left;   TRANSURETHRAL RESECTION OF PROSTATE N/A 08/25/2021   Procedure: TRANSURETHRAL RESECTION OF THE PROSTATE (TURP);  Surgeon: Likens Ricardo, MD;  Location: [facility];  Service: Urology;  Laterality: N/A;  1 HR   Patient Active Problem List   Diagnosis Date Noted   Presence of heart assist device (HCC) 04/16/2024   Interstitial pulmonary disease, unspecified (HCC) 04/16/2024   Abnormal PET scan of lung 03/29/2024   Gastrointestinal tube present (HCC) 02/18/2024   Granulation tissue of site of gastrostomy 02/06/2024   Protein-calorie malnutrition, severe 01/23/2024   Multifocal pneumonia 01/22/2024   Chronic pulmonary aspiration 12/29/2023   Silent aspiration 12/29/2023   Moderate protein-calorie malnutrition (weight for age 22-74% of standard) 12/29/2023   Bilateral leg weakness 12/29/2023   Chronic constipation 12/29/2023   Somnolence 12/29/2023   Frequent falls 12/29/2023   Feeding difficulties, unspecified  12/20/2023   Shortness of breath 12/20/2023   Abnormal CT of the chest 10/10/2023   Carotid stenosis 05/11/2023   Spinal stenosis in cervical region 03/13/2023   Osteoarthritis of spine with radiculopathy, cervical region 07/14/2022   Cervicogenic headache 11/30/2021   Chronic neck pain 11/30/2021   Weight loss, non-intentional    Dysphagia    Polyp of colon    Prostatic hyperplasia 08/25/2021   Chronic insomnia 03/19/2020   Peripheral neuropathy 03/19/2020   Iron deficiency anemia 01/06/2020   Allergic dermatitis 07/08/2019   Osteoarthritis of left knee 02/26/2019   History of colonic polyps    History of duodenal ulcer    Chronic kidney disease, stage 3b (HCC)    Bilateral chronic knee pain 11/09/2018   Epistaxis 10/02/2018   Subjective tinnitus, bilateral 10/02/2018   Chronic pain of right thumb 02/22/2018   Acute bilateral thoracic back pain 01/23/2018   Osteoporosis without current pathological fracture 02/10/2017   Hypothyroidism 11/02/2016   Thoracic compression fracture (HCC) 09/06/2016   PVC's (premature ventricular contractions) 07/07/2016   Hypoalbuminemia 06/28/2016   IgA nephropathy 06/07/2016   Acute buttock  pain 05/23/2016   MDD (major depressive disorder), recurrent episode, moderate (HCC) 11/20/2015   OSA (obstructive sleep apnea) 02/05/2014   Orthostatic hypotension 02/07/2013   Scotoma involving central area 01/29/2013   Optic neuropathy 01/28/2013   HEMATURIA UNSPECIFIED 01/21/2010   HLD (hyperlipidemia) 07/05/2006   Generalized anxiety disorder 07/05/2006   CAD (coronary artery disease) 07/05/2006   GERD 07/05/2006    ONSET DATE: 03/2023 following ACDF; Date of referral 12/28/2023: date of this referral 02/01/2024  REFERRING DIAG:  T17.908D (ICD-10-CM) - Chronic pulmonary aspiration, subsequent encounter  T17.900D (ICD-10-CM) - Silent aspiration, subsequent encounter  E44.0 (ICD-10-CM) - Moderate protein-calorie malnutrition  R13.10 (ICD-10-CM) -  Dysphagia, unspecified type    THERAPY DIAG:  Dysphagia, pharyngeal phase  Rationale for Evaluation and Treatment Rehabilitation  SUBJECTIVE:   PERTINENT HISTORY and DIAGNOSTIC FINDINGS:  Pt is a 74 yo male w/ ACDF surgery 03/2023.  PMH: GERD(Not on a PPI per chart), Actinic keratosis (02/04/2021), Anemia, Anxiety, Asymptomatic LV dysfunction, BCC (basal cell carcinoma of skin) (08/05/2020), Berger's disease, CAD (coronary artery disease) (06/2005), resting tremor, Depression, Dilated aortic root (HCC), Hematuria, Hyperlipidemia, Hypothyroidism, Insomnia, Left ureteral stone, Orthostatic hypotension, OSA (obstructive sleep apnea), PVC's (premature ventricular contractions) (07/07/2016), Renal disorder, Rosacea, and Urticaria.  Pt is followed by Cardiology for Coronary Artery Stenosis currently; also followed for Migraines.     EGD in 2023 revealed Gastritis.      CT of Chest 10/2023: 1. Interim development of patchy consolidation and ground-glass   disease most evident at the right middle lobe and right lower lobes   with additional areas of mild ground-glass disease and scattered   clustered nodularity, findings are suspicious for multifocal   infection/pneumonia. Imaging follow-up to resolution is recommended.   2. Emphysema.   3. Aortic atherosclerosis.            Dysphagia - reported by pt as Chronic, ever since neck surgery in December 2024. Pt was seen in the emergency room s/p Fall, notes detailing that he slipped on soap in the shower, fell backwards on right side hit head and neck against shower seat, possible brief loss of consciousness, per chart.  ACDF 03/2023 per chart.  Since the neck surgery in 03/2023, he has noted food, liquids and pill getting stuck on the right side of my throat more.  I used to cough a lot more but not as much now.  I cough up what gets stuck..  He has lost significant weight per chart notes due to decreased eating.  Pt endorsed using his home O2 when he  feels SOB.   MBSS 12/26/2023 Patient appears to present w/ Chronic, moderate-severe Pharyngeal phase dysphagia per this assessment today. It appeared there was some impact from changes in the Cervical Esophagus s/p ACDF surgery(03/2023 per chart notes) including structural/tissue changes in/aournd the CP segment and cervical esophagus. Pt with intermittent silent aspiration of thin liquids.   Recommend continue current diet of easy to eat foods at home; thin liquids via cup- at this time. Oral care Before any oral intake. Swallow Strategies Recommended: No straws, moistened foods, SMALL bites/sips, Head Turn RIGHT w/ slight chin tuck, multiple swallows to aid clearing, AND strong Cough/throat clear b/t bites/sips and at end of meal.  D/t the Chronicity of pt's Pharyngeal phase Dysphagia w/ weight loss and pulmonary impact per recent Chest CT, pt and Family may need to discuss alternative means of nutrition/hydration as support w/ his Medical Team/PCP, while seeking swallow therapy.  Received ENT report dated 01/12/2024  Laryngoscopy revealed  Moderate vocal fold bowing bilaterally and moderate glottic gamp with phonation Impression/plan: Aspiration on MBSS and significant weight loss since ACDF last year. TVF shows moderate sized glottic gap on laryngoscoy but mobile vocal folds bilaterally. Pooling in pyriform on MBSS but no significant pooling of secretions on laryngoscopy. I feel that between speech therpay and possibly bilateral injection laryngoplasty, hopefully his swallowing will significantly improve. Will make referral to [facility] at [facility].   Pt and his wife report appt with Dr Brien in January 2026.  10/10/205 PEG placed  01/22/2024 thru 01/24/2024 Hospitalized d/t fall and confusion CT of the chest on arrival showed multifocal pneumonia and possible free air. When reviewed further with interventional radiology team it was determined this was an appropriate amount of free air  secondary to recent PEG tube placement. Patient was initiated on antibiotic therapy for pneumonia and improved significantly.   02/07/2024 CT Chest 1. Evolving multifocal inflammatory infiltrates, progressive within the right upper lobe and improved within the lung bases bilaterally. Recommend appropriate clinical management and imaging follow-up per clinical course. 2. Mild emphysema. Given emphysema as an independent lung cancer risk factor, consider evaluation for a low-dose CT lung cancer screening program if the patient is 74 years old and otherwise eligible.   PAIN:  Are you having pain? No  FALLS: Has patient fallen in last 6 months?  See PT evaluation for details  LIVING ENVIRONMENT: Lives with: lives with their spouse Lives in: House/apartment  PLOF:  Level of assistance: Independent with ADLs, Independent with IADLs Employment: Retired   PATIENT GOALS  to improve swallow function, nutrition and hydration, decrease aspiration risks  SUBJECTIVE STATEMENT: Pt pleasant, reports still at 114lb Pt accompanied by: self   OBJECTIVE:   TODAY'S TREATMENT:  Skilled treatment session focused on pt's dysphagia goals. SLP facilitated the session by providing the following interventions:       Pt reports 1lb weight gain.     Pt independently completed 3 sets of 8 repetitions with EMT set slightly past 55 cmH2O and 3 sets of 6 repetitions of IMST set to the same.  Pt was independent with pharyngeal strengthening (chin up, masako) 3 sets of 8 reps.     PATIENT EDUCATION: Education details: as above Person educated: Patient and Spouse Education method: Chief Technology Officer Education comprehension: needs further education  HOME EXERCISE PROGRAM:     See above   GOALS: Goals reviewed with patient? Yes   SHORT TERM GOALS: Target date: 10 sessions            Updated 02/28/2024            Updated 02/09/2024            Update 03/26/2024 With Mod I, pt will  perform compensatory swallow strategies (chin tuck, multiple swallows, head turn) to eliminate s/s of aspiration when consuming least restrictive diet. Baseline: Goal status: MET: no overt s/sx with water  consumption this date   2.  With Min A, pt will complete (supraglottic, swallow, Mednelson maneuver, effortful swallow) to improve oral motor weakness, tongue base retraction, hyolarnygeal excursion, airway protection and/or clearance of the bolus through the pharynx. Baseline:  Goal status: IN PROGRESS: progress made; moderate A: Progress made, fluctuating between supervision and independent cues   3. Pt will demonstrate overt s/s of aspiration when consuming ice chips.              Baseline: Goal status: IN PROGRESSING: no overt s/s of aspiration observed  during today's session: Intermittent overt s/s observed     LONG TERM GOALS: Target date: 06/18/2024             Updated 02/28/2024             Updated: 02/09/2024             Updated: 03/26/2024    With Mod I, pt will demonstrate the ability to adequately self-monitor swallowing skills and perform appropriate compensatory techniques to reduce s/s of aspiration and to safely consume least restrictive diet.     Baseline:  Goal status: IN PROGRESS: continues to be appropriate goal   2.  Pt will participate in repeat instrumental swallow evaluation to assess least restrictive diet.  Baseline:  Goal status: IN PROGRESS: continues to be appropriate goal: Modified Barium scheduled completed 11/21; would benefit from repeat MBSS  ASSESSMENT:  CLINICAL IMPRESSION: Patient is a 74 y.o. male who was seen today for a clinical swallow re-evaluation d/t severe pharyngeal phase dysphagia following ACDF (03/2023).    See the above treatment note for details.   OBJECTIVE IMPAIRMENTS include dysphagia. These impairments are limiting patient from safety when swallowing. Factors affecting potential to achieve goals and functional outcome are  severity of impairments and malnutrition. Patient will benefit from skilled SLP services to address above impairments and improve overall function.  REHAB POTENTIAL: Good  PLAN: SLP FREQUENCY: 1-2x/week  SLP DURATION: 12 weeks  PLANNED INTERVENTIONS: Aspiration precaution training, Pharyngeal strengthening exercises, Diet toleration management , Oral motor exercises, SLP instruction and feedback, Compensatory strategies, and Patient/family education   Teon Hudnall B. Rubbie, M.S., CCC-SLP, CBIS Speech-Language Pathologist Certified Brain Injury Specialist [facility]  [facility] [phone] Ascom [phone] Fax [phone]               "

## 2024-05-02 ENCOUNTER — Ambulatory Visit: Admitting: Speech Pathology

## 2024-05-02 ENCOUNTER — Ambulatory Visit

## 2024-05-02 ENCOUNTER — Encounter: Admitting: Speech Pathology

## 2024-05-02 ENCOUNTER — Ambulatory Visit: Admitting: Physical Therapy

## 2024-05-02 DIAGNOSIS — M542 Cervicalgia: Secondary | ICD-10-CM

## 2024-05-02 DIAGNOSIS — R1312 Dysphagia, oropharyngeal phase: Secondary | ICD-10-CM

## 2024-05-02 DIAGNOSIS — R269 Unspecified abnormalities of gait and mobility: Secondary | ICD-10-CM

## 2024-05-02 DIAGNOSIS — M6281 Muscle weakness (generalized): Secondary | ICD-10-CM

## 2024-05-02 DIAGNOSIS — M546 Pain in thoracic spine: Secondary | ICD-10-CM

## 2024-05-02 DIAGNOSIS — R490 Dysphonia: Secondary | ICD-10-CM

## 2024-05-02 DIAGNOSIS — R2689 Other abnormalities of gait and mobility: Secondary | ICD-10-CM

## 2024-05-02 NOTE — Therapy (Signed)
 " OUTPATIENT SPEECH LANGUAGE PATHOLOGY  SWALLOW TREATMENT     Patient Name: Shawn Lam MRN: [id number] DOB:Mar 22, 1951, 74 y.o., male Today's Date: 05/02/2024  PCP: Greig Ring, MD REFERRING PROVIDER: Greig Ring, MD    End of Session - 05/02/24 1454     Visit Number 27    Number of Visits 43    Date for Recertification  06/18/24    Authorization Type Medicare Part A    Progress Note Due on Visit 30    SLP Start Time 1445    SLP Stop Time  1530    SLP Time Calculation (min) 45 min    Activity Tolerance Patient tolerated treatment well          Past Medical History:  Diagnosis Date   Actinic keratosis 02/04/2021   right lateral neck   Anemia    Anxiety    Asymptomatic LV dysfunction    50-55% by echo 06/2016   BCC (basal cell carcinoma of skin) 08/05/2020   left neck infra auricular - nodular pattern - treated with [facility] 09/17/2020   Berger's disease    Blood transfusion without reported diagnosis 04/11/2018   CAD (coronary artery disease) 06/2005   PCI of LAD and RCA   Cancer (HCC)    Cataract 04/11/2016   04/11/2016   Depression    Dilated aortic root    4cm at sinus of Valsalva   Hematuria    Hyperlipidemia    Hypothyroidism    Insomnia    Left ureteral stone    Orthostatic hypotension    negative tilt table   OSA (obstructive sleep apnea)    moderate with AHI 17/hr, uses CPAP nightly(01/15/19 - has Inspire implant)   Osteoporosis 04/11/2016   PVC's (premature ventricular contractions) 07/07/2016   Renal disorder    PT IS SEEING DR. BETSEY- NEPHROLOGIST   Rosacea    Sleep apnea 04/11/2016   04/11/2016   Ulcer 04/11/2018   Urticaria    Past Surgical History:  Procedure Laterality Date   ANTERIOR CERVICAL DECOMP/DISCECTOMY FUSION N/A 03/13/2023   Procedure: CERVICAL FOUR-FIVE, CERVICAL FIVE-SIX ANTERIOR CERVICAL DISCECTOMY/DECOMPRESSION FUSION;  Surgeon: Onetha Kuba, MD;  Location: [facility] OR;  Service: Neurosurgery;  Laterality: N/A;  3C   CARDIAC  CATHETERIZATION  09/06/2013   [facility]   CARDIAC CATHETERIZATION     COLONOSCOPY WITH PROPOFOL  N/A 01/21/2019   Procedure: COLONOSCOPY WITH PROPOFOL ;  Surgeon: Jinny Carmine, MD;  Location: [facility];  Service: Endoscopy;  Laterality: N/A;  sleep apnea   COLONOSCOPY WITH PROPOFOL  N/A 10/19/2021   Procedure: COLONOSCOPY WITH PROPOFOL ;  Surgeon: Jinny Carmine, MD;  Location: [facility] ENDOSCOPY;  Service: Endoscopy;  Laterality: N/A;   CORONARY ANGIOPLASTY  2004   STENT PLACEMENT   CYSTOSCOPY WITH RETROGRADE PYELOGRAM, URETEROSCOPY AND STENT PLACEMENT Left 03/19/2014   Procedure: CYSTOSCOPY WITH RETROGRADE PYELOGRAM, URETEROSCOPY AND STENT PLACEMENT;  Surgeon: Mickey Ricardo KATHEE Alvaro, MD;  Location: [facility] ORS;  Service: Urology;  Laterality: Left;   CYSTOSCOPY WITH RETROGRADE PYELOGRAM, URETEROSCOPY AND STENT PLACEMENT Bilateral 05/20/2015   Procedure:  CYSTOSCOPY WITH BILATERAL RETROGRADE PYELOGRAM,RIGHT  DIAGNOSTIC URETEROSCOPY ,LEFT URETEROSCOPY WITH HOLMIUM LASER  AND BILATERAL  STENT PLACEMENT ;  Surgeon: Ricardo Alvaro, MD;  Location: [facility];  Service: Urology;  Laterality: Bilateral;   DRUG INDUCED ENDOSCOPY N/A 10/20/2017   Procedure: DRUG INDUCED SLEEP ENDOSCOPY;  Surgeon: Carlie Clark, MD;  Location: [facility];  Service: ENT;  Laterality: N/A;   EP IMPLANTABLE DEVICE N/A 05/13/2016   Procedure:  Loop Recorder Insertion;  Surgeon: Elspeth JAYSON Sage, MD;  Location: [facility] INVASIVE CV LAB;  Service: Cardiovascular;  Laterality: N/A;   ESOPHAGOGASTRODUODENOSCOPY N/A 10/19/2021   Procedure: ESOPHAGOGASTRODUODENOSCOPY (EGD);  Surgeon: Jinny Carmine, MD;  Location: [facility] ENDOSCOPY;  Service: Endoscopy;  Laterality: N/A;   ESOPHAGOGASTRODUODENOSCOPY (EGD) WITH PROPOFOL  N/A 11/30/2018   Procedure: ESOPHAGOGASTRODUODENOSCOPY (EGD) WITH PROPOFOL ;  Surgeon: Therisa Bi, MD;  Location: [facility] ENDOSCOPY;  Service: Gastroenterology;  Laterality: N/A;   ESOPHAGOGASTRODUODENOSCOPY (EGD) WITH PROPOFOL  N/A  12/01/2018   Procedure: ESOPHAGOGASTRODUODENOSCOPY (EGD) WITH PROPOFOL ;  Surgeon: Jinny Carmine, MD;  Location: [facility] ENDOSCOPY;  Service: Endoscopy;  Laterality: N/A;   HOLMIUM LASER APPLICATION Bilateral 05/20/2015   Procedure: HOLMIUM LASER APPLICATION;  Surgeon: Ricardo Likens, MD;  Location: [facility];  Service: Urology;  Laterality: Bilateral;   IMPLIMENTATION OF A HYPOGLOSSAL NERVE STIMULATOR  12/08/2017   OSA    IR GASTROSTOMY TUBE MOD SED  01/18/2024   IR RADIOLOGIST EVAL & MGMT  02/13/2024   LEFT HEART CATHETERIZATION WITH CORONARY ANGIOGRAM N/A 09/06/2013   Procedure: LEFT HEART CATHETERIZATION WITH CORONARY ANGIOGRAM;  Surgeon: Victory LELON Claudene DOUGLAS, MD;  Location: [facility] CATH LAB;  Service: Cardiovascular;  Laterality: N/A;   LEFT KNEE CAP  SURGERY ABOUT 40 YRS AGO  ? 1970     SPINE SURGERY  Desk in neck   STONE EXTRACTION WITH BASKET Left 03/19/2014   Procedure: STONE EXTRACTION WITH BASKET;  Surgeon: Mickey Ricardo KATHEE Likens, MD;  Location: [facility];  Service: Urology;  Laterality: Left;   TRANSURETHRAL RESECTION OF PROSTATE N/A 08/25/2021   Procedure: TRANSURETHRAL RESECTION OF THE PROSTATE (TURP);  Surgeon: Likens Ricardo, MD;  Location: [facility];  Service: Urology;  Laterality: N/A;  1 HR   Patient Active Problem List   Diagnosis Date Noted   Presence of heart assist device (HCC) 04/16/2024   Interstitial pulmonary disease, unspecified (HCC) 04/16/2024   Abnormal PET scan of lung 03/29/2024   Gastrointestinal tube present (HCC) 02/18/2024   Granulation tissue of site of gastrostomy 02/06/2024   Protein-calorie malnutrition, severe 01/23/2024   Multifocal pneumonia 01/22/2024   Chronic pulmonary aspiration 12/29/2023   Silent aspiration 12/29/2023   Moderate protein-calorie malnutrition (weight for age 8-74% of standard) 12/29/2023   Bilateral leg weakness 12/29/2023   Chronic constipation 12/29/2023   Somnolence 12/29/2023   Frequent falls 12/29/2023   Feeding difficulties, unspecified  12/20/2023   Shortness of breath 12/20/2023   Abnormal CT of the chest 10/10/2023   Carotid stenosis 05/11/2023   Spinal stenosis in cervical region 03/13/2023   Osteoarthritis of spine with radiculopathy, cervical region 07/14/2022   Cervicogenic headache 11/30/2021   Chronic neck pain 11/30/2021   Weight loss, non-intentional    Dysphagia    Polyp of colon    Prostatic hyperplasia 08/25/2021   Chronic insomnia 03/19/2020   Peripheral neuropathy 03/19/2020   Iron deficiency anemia 01/06/2020   Allergic dermatitis 07/08/2019   Osteoarthritis of left knee 02/26/2019   History of colonic polyps    History of duodenal ulcer    Chronic kidney disease, stage 3b (HCC)    Bilateral chronic knee pain 11/09/2018   Epistaxis 10/02/2018   Subjective tinnitus, bilateral 10/02/2018   Chronic pain of right thumb 02/22/2018   Acute bilateral thoracic back pain 01/23/2018   Osteoporosis without current pathological fracture 02/10/2017   Hypothyroidism 11/02/2016   Thoracic compression fracture (HCC) 09/06/2016   PVC's (premature ventricular contractions) 07/07/2016   Hypoalbuminemia 06/28/2016   IgA nephropathy 06/07/2016   Acute buttock  pain 05/23/2016   MDD (major depressive disorder), recurrent episode, moderate (HCC) 11/20/2015   OSA (obstructive sleep apnea) 02/05/2014   Orthostatic hypotension 02/07/2013   Scotoma involving central area 01/29/2013   Optic neuropathy 01/28/2013   HEMATURIA UNSPECIFIED 01/21/2010   HLD (hyperlipidemia) 07/05/2006   Generalized anxiety disorder 07/05/2006   CAD (coronary artery disease) 07/05/2006   GERD 07/05/2006    ONSET DATE: 03/2023 following ACDF; Date of referral 12/28/2023: date of this referral 02/01/2024  REFERRING DIAG:  T17.908D (ICD-10-CM) - Chronic pulmonary aspiration, subsequent encounter  T17.900D (ICD-10-CM) - Silent aspiration, subsequent encounter  E44.0 (ICD-10-CM) - Moderate protein-calorie malnutrition  R13.10 (ICD-10-CM) -  Dysphagia, unspecified type    THERAPY DIAG:  Dysphagia, oropharyngeal phase  Rationale for Evaluation and Treatment Rehabilitation  SUBJECTIVE:   PERTINENT HISTORY and DIAGNOSTIC FINDINGS:  Pt is a 74 yo male w/ ACDF surgery 03/2023.  PMH: GERD(Not on a PPI per chart), Actinic keratosis (02/04/2021), Anemia, Anxiety, Asymptomatic LV dysfunction, BCC (basal cell carcinoma of skin) (08/05/2020), Berger's disease, CAD (coronary artery disease) (06/2005), resting tremor, Depression, Dilated aortic root (HCC), Hematuria, Hyperlipidemia, Hypothyroidism, Insomnia, Left ureteral stone, Orthostatic hypotension, OSA (obstructive sleep apnea), PVC's (premature ventricular contractions) (07/07/2016), Renal disorder, Rosacea, and Urticaria.  Pt is followed by Cardiology for Coronary Artery Stenosis currently; also followed for Migraines.     EGD in 2023 revealed Gastritis.      CT of Chest 10/2023: 1. Interim development of patchy consolidation and ground-glass   disease most evident at the right middle lobe and right lower lobes   with additional areas of mild ground-glass disease and scattered   clustered nodularity, findings are suspicious for multifocal   infection/pneumonia. Imaging follow-up to resolution is recommended.   2. Emphysema.   3. Aortic atherosclerosis.            Dysphagia - reported by pt as Chronic, ever since neck surgery in December 2024. Pt was seen in the emergency room s/p Fall, notes detailing that he slipped on soap in the shower, fell backwards on right side hit head and neck against shower seat, possible brief loss of consciousness, per chart.  ACDF 03/2023 per chart.  Since the neck surgery in 03/2023, he has noted food, liquids and pill getting stuck on the right side of my throat more.  I used to cough a lot more but not as much now.  I cough up what gets stuck..  He has lost significant weight per chart notes due to decreased eating.  Pt endorsed using his home O2 when he  feels SOB.   MBSS 12/26/2023 Patient appears to present w/ Chronic, moderate-severe Pharyngeal phase dysphagia per this assessment today. It appeared there was some impact from changes in the Cervical Esophagus s/p ACDF surgery(03/2023 per chart notes) including structural/tissue changes in/aournd the CP segment and cervical esophagus. Pt with intermittent silent aspiration of thin liquids.   Recommend continue current diet of easy to eat foods at home; thin liquids via cup- at this time. Oral care Before any oral intake. Swallow Strategies Recommended: No straws, moistened foods, SMALL bites/sips, Head Turn RIGHT w/ slight chin tuck, multiple swallows to aid clearing, AND strong Cough/throat clear b/t bites/sips and at end of meal.  D/t the Chronicity of pt's Pharyngeal phase Dysphagia w/ weight loss and pulmonary impact per recent Chest CT, pt and Family may need to discuss alternative means of nutrition/hydration as support w/ his Medical Team/PCP, while seeking swallow therapy.  Received ENT report dated 01/12/2024  Laryngoscopy revealed  Moderate vocal fold bowing bilaterally and moderate glottic gamp with phonation Impression/plan: Aspiration on MBSS and significant weight loss since ACDF last year. TVF shows moderate sized glottic gap on laryngoscoy but mobile vocal folds bilaterally. Pooling in pyriform on MBSS but no significant pooling of secretions on laryngoscopy. I feel that between speech therpay and possibly bilateral injection laryngoplasty, hopefully his swallowing will significantly improve. Will make referral to Dr Brien at [facility].   Pt and his wife report appt with Dr Brien in January 2026.  10/10/205 PEG placed  01/22/2024 thru 01/24/2024 Hospitalized d/t fall and confusion CT of the chest on arrival showed multifocal pneumonia and possible free air. When reviewed further with interventional radiology team it was determined this was an appropriate amount of free air  secondary to recent PEG tube placement. Patient was initiated on antibiotic therapy for pneumonia and improved significantly.   02/07/2024 CT Chest 1. Evolving multifocal inflammatory infiltrates, progressive within the right upper lobe and improved within the lung bases bilaterally. Recommend appropriate clinical management and imaging follow-up per clinical course. 2. Mild emphysema. Given emphysema as an independent lung cancer risk factor, consider evaluation for a low-dose CT lung cancer screening program if the patient is 74 years old and otherwise eligible.   PAIN:  Are you having pain? No  FALLS: Has patient fallen in last 6 months?  See PT evaluation for details  LIVING ENVIRONMENT: Lives with: lives with their spouse Lives in: House/apartment  PLOF:  Level of assistance: Independent with ADLs, Independent with IADLs Employment: Retired   PATIENT GOALS  to improve swallow function, nutrition and hydration, decrease aspiration risks  SUBJECTIVE STATEMENT: Pt pleasant, uncertain if he will have appt with Dr Brien d/t winter weather Pt accompanied by: self   OBJECTIVE:   TODAY'S TREATMENT:  Skilled treatment session focused on pt's dysphagia goals. SLP facilitated the session by providing the following interventions:    Pt independently completed 3 sets of 8 repetitions with EMT set slightly past 55 cmH2O and 3 sets of 6 repetitions of IMST set to the same.  Pt was independent with pharyngeal strengthening (chin up, masako) 3 sets of 8 reps.   Pt with intermittent cough and throat clear when consuming thin water  via cup and when producing the above exercises.   This clinical research associate provided letter for pt to take with him to appt with otolaryngologist at [facility].     PATIENT EDUCATION: Education details: as above Person educated: Patient and Spouse Education method: Chief Technology Officer Education comprehension: needs further education  HOME EXERCISE  PROGRAM:     See above   GOALS: Goals reviewed with patient? Yes   SHORT TERM GOALS: Target date: 10 sessions            Updated 02/28/2024            Updated 02/09/2024            Update 03/26/2024 With Mod I, pt will perform compensatory swallow strategies (chin tuck, multiple swallows, head turn) to eliminate s/s of aspiration when consuming least restrictive diet. Baseline: Goal status: MET: no overt s/sx with water  consumption this date   2.  With Min A, pt will complete (supraglottic, swallow, Mednelson maneuver, effortful swallow) to improve oral motor weakness, tongue base retraction, hyolarnygeal excursion, airway protection and/or clearance of the bolus through the pharynx. Baseline:  Goal status: IN PROGRESS: progress made; moderate A: Progress made, fluctuating between supervision and independent cues  3. Pt will demonstrate overt s/s of aspiration when consuming ice chips.              Baseline: Goal status: IN PROGRESSING: no overt s/s of aspiration observed during today's session: Intermittent overt s/s observed     LONG TERM GOALS: Target date: 06/18/2024             Updated 02/28/2024             Updated: 02/09/2024             Updated: 03/26/2024    With Mod I, pt will demonstrate the ability to adequately self-monitor swallowing skills and perform appropriate compensatory techniques to reduce s/s of aspiration and to safely consume least restrictive diet.     Baseline:  Goal status: IN PROGRESS: continues to be appropriate goal   2.  Pt will participate in repeat instrumental swallow evaluation to assess least restrictive diet.  Baseline:  Goal status: IN PROGRESS: continues to be appropriate goal: Modified Barium scheduled completed 11/21; would benefit from repeat MBSS  ASSESSMENT:  CLINICAL IMPRESSION: Patient is a 74 y.o. male who was seen today for a clinical swallow re-evaluation d/t severe pharyngeal phase dysphagia following ACDF (03/2023).    See  the above treatment note for details.   OBJECTIVE IMPAIRMENTS include dysphagia. These impairments are limiting patient from safety when swallowing. Factors affecting potential to achieve goals and functional outcome are severity of impairments and malnutrition. Patient will benefit from skilled SLP services to address above impairments and improve overall function.  REHAB POTENTIAL: Good  PLAN: SLP FREQUENCY: 1-2x/week  SLP DURATION: 12 weeks  PLANNED INTERVENTIONS: Aspiration precaution training, Pharyngeal strengthening exercises, Diet toleration management , Oral motor exercises, SLP instruction and feedback, Compensatory strategies, and Patient/family education   Abdallah Hern B. Rubbie, M.S., CCC-SLP, CBIS Speech-Language Pathologist Certified Brain Injury Specialist [facility]  [facility] [phone] Ascom [phone] Fax [phone]               "

## 2024-05-02 NOTE — Therapy (Unsigned)
 " OUTPATIENT PHYSICAL THERAPY TREATMENT/ Re-certification   Patient Name: Shawn Lam MRN: [id number] DOB:01-Oct-1950, 74 y.o., male Today's Date: 05/02/2024  END OF SESSION:  PT End of Session - 05/02/24 1536     Visit Number 26    Number of Visits 33    Date for Recertification  05/28/24    Progress Note Due on Visit 30    PT Start Time 1535    PT Stop Time 1615    PT Time Calculation (min) 40 min    Equipment Utilized During Treatment Gait belt    Activity Tolerance Patient tolerated treatment well    Behavior During Therapy WFL for tasks assessed/performed         Past Medical History:  Diagnosis Date   Actinic keratosis 02/04/2021   right lateral neck   Anemia    Anxiety    Asymptomatic LV dysfunction    50-55% by echo 06/2016   BCC (basal cell carcinoma of skin) 08/05/2020   left neck infra auricular - nodular pattern - treated with [facility] 09/17/2020   Berger's disease    Blood transfusion without reported diagnosis 04/11/2018   CAD (coronary artery disease) 06/2005   PCI of LAD and RCA   Cancer (HCC)    Cataract 04/11/2016   04/11/2016   Depression    Dilated aortic root    4cm at sinus of Valsalva   Hematuria    Hyperlipidemia    Hypothyroidism    Insomnia    Left ureteral stone    Orthostatic hypotension    negative tilt table   OSA (obstructive sleep apnea)    moderate with AHI 17/hr, uses CPAP nightly(01/15/19 - has Inspire implant)   Osteoporosis 04/11/2016   PVC's (premature ventricular contractions) 07/07/2016   Renal disorder    PT IS SEEING DR. BETSEY- NEPHROLOGIST   Rosacea    Sleep apnea 04/11/2016   04/11/2016   Ulcer 04/11/2018   Urticaria    Past Surgical History:  Procedure Laterality Date   ANTERIOR CERVICAL DECOMP/DISCECTOMY FUSION N/A 03/13/2023   Procedure: CERVICAL FOUR-FIVE, CERVICAL FIVE-SIX ANTERIOR CERVICAL DISCECTOMY/DECOMPRESSION FUSION;  Surgeon: Onetha Kuba, MD;  Location: [facility] OR;  Service: Neurosurgery;  Laterality: N/A;   3C   CARDIAC CATHETERIZATION  09/06/2013   [facility]   CARDIAC CATHETERIZATION     COLONOSCOPY WITH PROPOFOL  N/A 01/21/2019   Procedure: COLONOSCOPY WITH PROPOFOL ;  Surgeon: Jinny Carmine, MD;  Location: [facility];  Service: Endoscopy;  Laterality: N/A;  sleep apnea   COLONOSCOPY WITH PROPOFOL  N/A 10/19/2021   Procedure: COLONOSCOPY WITH PROPOFOL ;  Surgeon: Jinny Carmine, MD;  Location: [facility] ENDOSCOPY;  Service: Endoscopy;  Laterality: N/A;   CORONARY ANGIOPLASTY  2004   STENT PLACEMENT   CYSTOSCOPY WITH RETROGRADE PYELOGRAM, URETEROSCOPY AND STENT PLACEMENT Left 03/19/2014   Procedure: CYSTOSCOPY WITH RETROGRADE PYELOGRAM, URETEROSCOPY AND STENT PLACEMENT;  Surgeon: Mickey Ricardo KATHEE Alvaro, MD;  Location: [facility] ORS;  Service: Urology;  Laterality: Left;   CYSTOSCOPY WITH RETROGRADE PYELOGRAM, URETEROSCOPY AND STENT PLACEMENT Bilateral 05/20/2015   Procedure:  CYSTOSCOPY WITH BILATERAL RETROGRADE PYELOGRAM,RIGHT  DIAGNOSTIC URETEROSCOPY ,LEFT URETEROSCOPY WITH HOLMIUM LASER  AND BILATERAL  STENT PLACEMENT ;  Surgeon: Ricardo Alvaro, MD;  Location: [facility];  Service: Urology;  Laterality: Bilateral;   DRUG INDUCED ENDOSCOPY N/A 10/20/2017   Procedure: DRUG INDUCED SLEEP ENDOSCOPY;  Surgeon: Carlie Clark, MD;  Location: [facility];  Service: ENT;  Laterality: N/A;   EP IMPLANTABLE DEVICE N/A 05/13/2016   Procedure: Loop Recorder  Insertion;  Surgeon: Elspeth JAYSON Sage, MD;  Location: [facility] INVASIVE CV LAB;  Service: Cardiovascular;  Laterality: N/A;   ESOPHAGOGASTRODUODENOSCOPY N/A 10/19/2021   Procedure: ESOPHAGOGASTRODUODENOSCOPY (EGD);  Surgeon: Jinny Carmine, MD;  Location: [facility] ENDOSCOPY;  Service: Endoscopy;  Laterality: N/A;   ESOPHAGOGASTRODUODENOSCOPY (EGD) WITH PROPOFOL  N/A 11/30/2018   Procedure: ESOPHAGOGASTRODUODENOSCOPY (EGD) WITH PROPOFOL ;  Surgeon: Therisa Bi, MD;  Location: [facility] ENDOSCOPY;  Service: Gastroenterology;  Laterality: N/A;   ESOPHAGOGASTRODUODENOSCOPY (EGD) WITH  PROPOFOL  N/A 12/01/2018   Procedure: ESOPHAGOGASTRODUODENOSCOPY (EGD) WITH PROPOFOL ;  Surgeon: Jinny Carmine, MD;  Location: [facility] ENDOSCOPY;  Service: Endoscopy;  Laterality: N/A;   HOLMIUM LASER APPLICATION Bilateral 05/20/2015   Procedure: HOLMIUM LASER APPLICATION;  Surgeon: Ricardo Likens, MD;  Location: [facility];  Service: Urology;  Laterality: Bilateral;   IMPLIMENTATION OF A HYPOGLOSSAL NERVE STIMULATOR  12/08/2017   OSA    IR GASTROSTOMY TUBE MOD SED  01/18/2024   IR RADIOLOGIST EVAL & MGMT  02/13/2024   LEFT HEART CATHETERIZATION WITH CORONARY ANGIOGRAM N/A 09/06/2013   Procedure: LEFT HEART CATHETERIZATION WITH CORONARY ANGIOGRAM;  Surgeon: Victory LELON Claudene DOUGLAS, MD;  Location: [facility] CATH LAB;  Service: Cardiovascular;  Laterality: N/A;   LEFT KNEE CAP  SURGERY ABOUT 40 YRS AGO  ? 1970     SPINE SURGERY  Desk in neck   STONE EXTRACTION WITH BASKET Left 03/19/2014   Procedure: STONE EXTRACTION WITH BASKET;  Surgeon: Mickey Ricardo KATHEE Likens, MD;  Location: [facility];  Service: Urology;  Laterality: Left;   TRANSURETHRAL RESECTION OF PROSTATE N/A 08/25/2021   Procedure: TRANSURETHRAL RESECTION OF THE PROSTATE (TURP);  Surgeon: Likens Ricardo, MD;  Location: [facility];  Service: Urology;  Laterality: N/A;  1 HR   Patient Active Problem List   Diagnosis Date Noted   Presence of heart assist device (HCC) 04/16/2024   Interstitial pulmonary disease, unspecified (HCC) 04/16/2024   Abnormal PET scan of lung 03/29/2024   Gastrointestinal tube present (HCC) 02/18/2024   Granulation tissue of site of gastrostomy 02/06/2024   Protein-calorie malnutrition, severe 01/23/2024   Multifocal pneumonia 01/22/2024   Chronic pulmonary aspiration 12/29/2023   Silent aspiration 12/29/2023   Moderate protein-calorie malnutrition (weight for age 13-74% of standard) 12/29/2023   Bilateral leg weakness 12/29/2023   Chronic constipation 12/29/2023   Somnolence 12/29/2023   Frequent falls 12/29/2023   Feeding  difficulties, unspecified 12/20/2023   Shortness of breath 12/20/2023   Abnormal CT of the chest 10/10/2023   Carotid stenosis 05/11/2023   Spinal stenosis in cervical region 03/13/2023   Osteoarthritis of spine with radiculopathy, cervical region 07/14/2022   Cervicogenic headache 11/30/2021   Chronic neck pain 11/30/2021   Weight loss, non-intentional    Dysphagia    Polyp of colon    Prostatic hyperplasia 08/25/2021   Chronic insomnia 03/19/2020   Peripheral neuropathy 03/19/2020   Iron deficiency anemia 01/06/2020   Allergic dermatitis 07/08/2019   Osteoarthritis of left knee 02/26/2019   History of colonic polyps    History of duodenal ulcer    Chronic kidney disease, stage 3b (HCC)    Bilateral chronic knee pain 11/09/2018   Epistaxis 10/02/2018   Subjective tinnitus, bilateral 10/02/2018   Chronic pain of right thumb 02/22/2018   Acute bilateral thoracic back pain 01/23/2018   Osteoporosis without current pathological fracture 02/10/2017   Hypothyroidism 11/02/2016   Thoracic compression fracture (HCC) 09/06/2016   PVC's (premature ventricular contractions) 07/07/2016   Hypoalbuminemia 06/28/2016   IgA nephropathy 06/07/2016   Acute buttock pain 05/23/2016  MDD (major depressive disorder), recurrent episode, moderate (HCC) 11/20/2015   OSA (obstructive sleep apnea) 02/05/2014   Orthostatic hypotension 02/07/2013   Scotoma involving central area 01/29/2013   Optic neuropathy 01/28/2013   HEMATURIA UNSPECIFIED 01/21/2010   HLD (hyperlipidemia) 07/05/2006   Generalized anxiety disorder 07/05/2006   CAD (coronary artery disease) 07/05/2006   GERD 07/05/2006    PCP: Dr. Greig Ring    REFERRING PROVIDER: Dr. Arley Helling   REFERRING DIAG: M54.6 (ICD-10-CM) - Acute bilateral thoracic back pain  THERAPY DIAG:  Abnormality of gait  Balance disorder  Muscle weakness (generalized)  Dysphonia  Pain in thoracic spine  Cervicalgia  Rationale for Evaluation and  Treatment: Rehabilitation  ONSET DATE: May 2025    SUBJECTIVE:                                                                                                                                                                                                         SUBJECTIVE STATEMENT  Pt denies any falls since the last visit. Has not passed out since last PT visit, but states that he is getting dizzy.  Reports that MD has increased BP medication(Midodrine ) to 10mg  3 times per day . Pt continues to report mild to moderate dizziness this AM, ut it is improving this afternoon prior to PT treatment.  Reports pain/stiffness in neck 5/10 today. Does report cervicogenic HA last night.    PERTINENT HISTORY:   73yoM referred to physical therapy for ongoing neck pain following cervical fusion in May 2020 and newly developed thoracic pain from T12 compression fracture that occurred sometime in Spring of 2025 (associated radicular pain pattern). Pt has pain localized to upper traps and suboccipitals. Pt attempted DC of prednisone  and gabapentin  two weeks prior to evaluation here, severe decline in function due to pain, barely able to walk for two weeks. He describes feeling increased fatigue and weakness to the point where he struggled to walk. Pt also reported repeated falls in the setting of orthostatic hypotension with him experiencing syncope when he stands up from a seated position. He describes feeling a burning sensation that is also localized to his scapulae and that it occurs when he is doing UE activities like washing his car that is likely the radicular pain from thoracic vertebrae compression fracture.  PAIN: Are you having pain? 03/05/2024: 6/10 today in the neck  PRECAUTIONS: None Pt has thoracic compression fractures and is s/p cervical spine surgery   WEIGHT BEARING RESTRICTIONS: No  FALLS:   Has patient fallen in last 6 months? Yes. Number  of falls 6, pt reports that falls are happening in  the context of syncope when standing up from a seated position (Orthostatic Hypotension) , where he blacks out and falls. He is following up with his PCP, Dr. Karlene who is working with him on stabilizing his blood pressure and she has recently taken him off Gabapentin  to see if this helps. Pt reports that he does note an improvement in his symptoms with less light headedness since going off medication.   LIVING ENVIRONMENT: Lives with: lives with their spouse Lives in: House/apartment Stairs: No Has following equipment at home: Single point cane and Environmental Consultant - 4 wheeled  OCCUPATION: Retired     PLOF: Independent  PATIENT GOALS: Improve balance, decrease falls.       OBJECTIVE:   Note: Objective measures were completed at evaluation unless otherwise noted. Reassessment Measures 02/09/24: -eyes closed balance, normal stance: <5sec with retropulsion each time, ModA falls recovery  -narrow stance eyes open balance: <10sec with LOB, ModA falls recovery  -Long distance AMB assessment c SPC: 115ft with 2 DOE standing breaks ~15 seconds each, 15sec; crossover gait intermittently without LOB; -standing vitals taken at end of walk: stable and comparable to prior seated vitals.  -cervical rotation ROM: 50deg Rt, 53 degree left  -shoulder flexion MMT: 5/5 Rt, 4+/5 left; shoulder ABDCT MMT: 5/5 bilat  -ankle DF assessment: can perform >10x full range while leaning against wall (adequate for gait needs)  -sensory testing about ankles, lower legs: (feels intact, but pt reports constance N/T below ankle to his toes)  Standing BP assessment: 02/28/24 -124/81mmHg 95bpm standing @ 0 -122/56mmHg 90bpm standing @ 1 110/51mmHg 92bpm standing @ 2   Cervical ROM  1/20: Flexion: 30: extension: 30. Rot R: 45, Rot L40: SB L: 20. SB R: 15   TREATMENT DATE 05/02/24:   Sitting 115/77 HR 63 Standing 0 : 95/68 HR 73 Standing 2 min 101/78 HR 75  No orthostatic hypotension s/s on this afternoon.    Manual:  Seated STM for TrP management and improved muscle extensibility in the Upper traps, parascapular muscles, Cervical paraspinals   x 20 min total   AAROM cervical rotation 2 x 20sec hold bil  AAROM Side bending 2 x 30 sec hold bil  Side direciton AAROM  cerviical rotation with towel 2 x 20 sec bil. Moderate instruction to prevent blocking airway.  Cervical extension with snag using towel 2 x 15  UT stretch 2 x 20 sec bil  Pt left clinic ambulatory with no orthostatic s/s   PATIENT EDUCATION:  Education details: Pt progressing so far with cardio and balance activity.     Dangers of prolonged low BP.   Person educated: Patient Education method: Medical Illustrator Education comprehension: verbalized understanding, returned demonstration, and verbal cues required  HOME EXERCISE PROGRAM: Access Code: [email or web address] URL: [email or web address] Date: 04/17/2024 Prepared by: Lonni Gainer  Exercises - Marching Near Counter  - 1 x daily - 3-5 x weekly - 2 sets - 10 reps - 3 sec hold - Single leg balance near counter   - 1 x daily - 3-5 x weekly - 3 sets - 30 second hold - Heel Toe Raises with Counter Support  - 1 x daily - 3-5 x weekly - 2 sets - 15 reps - Sit to Stand with Arms Crossed  - 1 x daily - 3-5 x weekly - 2 sets - 10 reps  Access Code: [email or web address] URL: [email or web address] Date: 05/03/2024 Prepared by: Massie Dollar  Exercises - Seated Bilateral Shoulder Flexion Towel Slide at Table Top  - 1 x daily - 7 x weekly - 2 sets - 10 reps - 3-5 sec hold - Seated Upper Trapezius Stretch  - 1 x daily - 7 x weekly - 3 sets - 10 reps - Seated Thoracic Lumbar Extension  - 1 x daily - 7 x weekly - 3 sets - 10 reps - Seated Thoracic Extension AROM  - 1 x daily - 7 x weekly - 3 sets - 10 reps - Seated Cervical Rotation AROM  - 1 x daily - 7 x weekly - 3 sets - 10 reps - Seated Assisted Cervical Rotation with Towel  - 1 x daily - 7 x weekly - 3  sets - 10 reps - Cervical Extension AROM with Strap  - 1 x daily - 7 x weekly - 3 sets - 10 reps  ASSESSMENT:  CLINICAL IMPRESSION:   PT treatment focused on cervical ROM and improved muscle extensibility in Cspine and upper thoracic region.  Tolerated treatment well and reports decreased upper back/shoulder tightness upon completion. Improved BP on this day, but elected to perform all interventions in sitting to prevent orthostatic hypotension following prolonged supine. Pt reports significant reduction in cervical tension following PT treatment on this day,  Pt will continue to benefit from skilled physical therapy intervention to address impairments, improve QOL, and attain therapy goals.      OBJECTIVE IMPAIRMENTS: decreased balance, decreased ROM, decreased strength, hypomobility, impaired flexibility, impaired UE functional use, and pain.   ACTIVITY LIMITATIONS: carrying, lifting, standing, bathing, dressing, reach over head, and hygiene/grooming PARTICIPATION LIMITATIONS: cleaning, laundry, shopping, community activity, and yard work PERSONAL FACTORS: Age and 3+ comorbidities: CAD, Depression, are also affecting patient's functional outcome.  REHAB POTENTIAL: Good CLINICAL DECISION MAKING: Stable/uncomplicated EVALUATION COMPLEXITY: Low   GOALS: Goals reviewed with patient? No  SHORT TERM GOALS: Target date: 05/14/2024    Patient will demonstrate undestanding of home exercise plan by performing exercises correctly with evidence of good carry over with min to no verbal or tactile cues .   Baseline: NT 12/25/23: Performing exercises independently  Goal status: ACHIEVED   2.  Patient will demonstrate understanding of how to modify exercises to decrease thoracic and cervical pain exacerbation for improved function and to decrease incidence of pain flare ups.  Baseline: slightly improved. Will continue to address.  Goal status: In progress   LONG TERM GOALS: Target date:  05/28/2024  Patient will show a statistically significant improvement in his neck function as evidence by >=7.5 pt decrease in her neck disability index score to better be able to move neck to improve visual field while driving to avoid accidents. Vernel et al, 2009) Baseline: 33/50 (66%); 02/09/24: 66% 03/01/2024: 50% 04/12/24: 28 (56%) 04/30/24: 31 (61%)  Goal status: NOT MET     2.  Patient will increase cervical mobility by  >=5 degrees AROM for all planes of motion (flexion, extension, side bending, and rotation) for improved cervical function and evidence of pain reduction. Baseline: Cervical AROM Flex 30, Ext 30, Rot R/L 40/40, SB R/L 35/35; 02/09/24: Rt rotation 50 degrees, Left rotation: 53 degress  03/01/2024: Flex:  30*   Ext: 20*      Rot L: 32*     Rot R: 30*      SB L: 20*       SB R: 15* 1/20: Flexion: 30: extension: 30. Rot R: 45, Rot L40: SB L: 20. SB R: 15  Goal status: in progress   3.  Patient will improve shoulder and periscapular strength by 1/3 grade MMT (4- to 4) for improved cervical stability and UE function to carry out upper extremity activities like washing his car and gardening without being limited by pain and discomfort.  Baseline: Shoulder Flex R/L 4/4, Shoulder Abd R/L 4/4; 10/31: shoulder flexion Rt: 5/5, left 4+/5; shoulder ABDCT 5/5 bilat  03/01/2024: Shld Flex R: 5 Shld Flex L: 4+ , Shld abd R: 4 +  Shld Abd L: 4 Goal status: MET    4.  Patient will reduce neck and thoracic pain to <=4/10 NRPS while performing UE activities like washing his car and doing yard work as evidence of improved cervical and thoracic function.  Baseline: 10/10 cervical paraspinal and rhomboid pain with UE activity; up to a 9/10 intermittently with activity   03/01/2024: 8/10 NPS UE activity 04/12/24: 8/10 NPS with UE activity 04/30/24: 8/10 NPS with UE activity (washing Car; cleaning house) Goal status: NOT MET     PLAN:  PT FREQUENCY: 1-2x/week PT DURATION: 4 weeks PLANNED  INTERVENTIONS: [id number]- PT Re-evaluation, 97750- Physical Performance Testing, 97110-Therapeutic exercises, 97530- Therapeutic activity, 97112- Neuromuscular re-education, 97535- Self Care, [id number]- Manual therapy, [id number]- Gait training, [phone]- Canalith repositioning, [id number]- Aquatic Therapy, [id number]- Electrical stimulation (unattended), [phone]- Electrical stimulation (manual), [id number]- Traction (mechanical), 20560 (1-2 muscles), [id number] (3+ muscles)- Dry Needling, Patient/Family education, Balance training, Taping, Joint mobilization, Joint manipulation, Spinal manipulation, Spinal mobilization, Vestibular training, DME instructions, Cryotherapy, and Moist heat  PLAN FOR NEXT SESSION:    -continue to address cervical ROM/pain     Massie FORBES Dollar PT ,DPT Physical Therapist- [facility]   05/02/24, 3:36 PM   "

## 2024-05-06 ENCOUNTER — Encounter: Payer: Self-pay | Admitting: Family Medicine

## 2024-05-07 ENCOUNTER — Encounter: Admitting: Speech Pathology

## 2024-05-07 ENCOUNTER — Ambulatory Visit: Admitting: Physical Therapy

## 2024-05-07 ENCOUNTER — Ambulatory Visit

## 2024-05-07 ENCOUNTER — Ambulatory Visit: Admitting: Speech Pathology

## 2024-05-07 DIAGNOSIS — M6281 Muscle weakness (generalized): Secondary | ICD-10-CM

## 2024-05-07 DIAGNOSIS — R2689 Other abnormalities of gait and mobility: Secondary | ICD-10-CM

## 2024-05-07 DIAGNOSIS — R1312 Dysphagia, oropharyngeal phase: Secondary | ICD-10-CM

## 2024-05-07 DIAGNOSIS — M546 Pain in thoracic spine: Secondary | ICD-10-CM

## 2024-05-07 DIAGNOSIS — M542 Cervicalgia: Secondary | ICD-10-CM

## 2024-05-07 DIAGNOSIS — R269 Unspecified abnormalities of gait and mobility: Secondary | ICD-10-CM

## 2024-05-07 NOTE — Therapy (Unsigned)
 " OUTPATIENT PHYSICAL THERAPY TREATMENT/ Re-certification   Patient Name: Shawn Lam MRN: [id number] DOB:1950-10-23, 74 y.o., male Today's Date: 05/07/2024  END OF SESSION:  PT End of Session - 05/07/24 1604     Visit Number 27    Number of Visits 33    Date for Recertification  05/28/24    Progress Note Due on Visit 30    PT Start Time 1615    PT Stop Time 1655    PT Time Calculation (min) 40 min    Equipment Utilized During Treatment Gait belt    Activity Tolerance Patient tolerated treatment well    Behavior During Therapy WFL for tasks assessed/performed         Past Medical History:  Diagnosis Date   Actinic keratosis 02/04/2021   right lateral neck   Anemia    Anxiety    Asymptomatic LV dysfunction    50-55% by echo 06/2016   BCC (basal cell carcinoma of skin) 08/05/2020   left neck infra auricular - nodular pattern - treated with [facility] 09/17/2020   Berger's disease    Blood transfusion without reported diagnosis 04/11/2018   CAD (coronary artery disease) 06/2005   PCI of LAD and RCA   Cancer (HCC)    Cataract 04/11/2016   04/11/2016   Depression    Dilated aortic root    4cm at sinus of Valsalva   Hematuria    Hyperlipidemia    Hypothyroidism    Insomnia    Left ureteral stone    Orthostatic hypotension    negative tilt table   OSA (obstructive sleep apnea)    moderate with AHI 17/hr, uses CPAP nightly(01/15/19 - has Inspire implant)   Osteoporosis 04/11/2016   PVC's (premature ventricular contractions) 07/07/2016   Renal disorder    PT IS SEEING DR. BETSEY- NEPHROLOGIST   Rosacea    Sleep apnea 04/11/2016   04/11/2016   Ulcer 04/11/2018   Urticaria    Past Surgical History:  Procedure Laterality Date   ANTERIOR CERVICAL DECOMP/DISCECTOMY FUSION N/A 03/13/2023   Procedure: CERVICAL FOUR-FIVE, CERVICAL FIVE-SIX ANTERIOR CERVICAL DISCECTOMY/DECOMPRESSION FUSION;  Surgeon: Onetha Kuba, MD;  Location: [facility] OR;  Service: Neurosurgery;  Laterality: N/A;   3C   CARDIAC CATHETERIZATION  09/06/2013   [facility]   CARDIAC CATHETERIZATION     COLONOSCOPY WITH PROPOFOL  N/A 01/21/2019   Procedure: COLONOSCOPY WITH PROPOFOL ;  Surgeon: Jinny Carmine, MD;  Location: [facility];  Service: Endoscopy;  Laterality: N/A;  sleep apnea   COLONOSCOPY WITH PROPOFOL  N/A 10/19/2021   Procedure: COLONOSCOPY WITH PROPOFOL ;  Surgeon: Jinny Carmine, MD;  Location: [facility] ENDOSCOPY;  Service: Endoscopy;  Laterality: N/A;   CORONARY ANGIOPLASTY  2004   STENT PLACEMENT   CYSTOSCOPY WITH RETROGRADE PYELOGRAM, URETEROSCOPY AND STENT PLACEMENT Left 03/19/2014   Procedure: CYSTOSCOPY WITH RETROGRADE PYELOGRAM, URETEROSCOPY AND STENT PLACEMENT;  Surgeon: Mickey Ricardo KATHEE Alvaro, MD;  Location: [facility] ORS;  Service: Urology;  Laterality: Left;   CYSTOSCOPY WITH RETROGRADE PYELOGRAM, URETEROSCOPY AND STENT PLACEMENT Bilateral 05/20/2015   Procedure:  CYSTOSCOPY WITH BILATERAL RETROGRADE PYELOGRAM,RIGHT  DIAGNOSTIC URETEROSCOPY ,LEFT URETEROSCOPY WITH HOLMIUM LASER  AND BILATERAL  STENT PLACEMENT ;  Surgeon: Ricardo Alvaro, MD;  Location: [facility];  Service: Urology;  Laterality: Bilateral;   DRUG INDUCED ENDOSCOPY N/A 10/20/2017   Procedure: DRUG INDUCED SLEEP ENDOSCOPY;  Surgeon: Carlie Clark, MD;  Location: [facility];  Service: ENT;  Laterality: N/A;   EP IMPLANTABLE DEVICE N/A 05/13/2016   Procedure: Loop Recorder  Insertion;  Surgeon: Elspeth JAYSON Sage, MD;  Location: [facility] INVASIVE CV LAB;  Service: Cardiovascular;  Laterality: N/A;   ESOPHAGOGASTRODUODENOSCOPY N/A 10/19/2021   Procedure: ESOPHAGOGASTRODUODENOSCOPY (EGD);  Surgeon: Jinny Carmine, MD;  Location: [facility] ENDOSCOPY;  Service: Endoscopy;  Laterality: N/A;   ESOPHAGOGASTRODUODENOSCOPY (EGD) WITH PROPOFOL  N/A 11/30/2018   Procedure: ESOPHAGOGASTRODUODENOSCOPY (EGD) WITH PROPOFOL ;  Surgeon: Therisa Bi, MD;  Location: [facility];  Service: Gastroenterology;  Laterality: N/A;   ESOPHAGOGASTRODUODENOSCOPY (EGD) WITH  PROPOFOL  N/A 12/01/2018   Procedure: ESOPHAGOGASTRODUODENOSCOPY (EGD) WITH PROPOFOL ;  Surgeon: Jinny Carmine, MD;  Location: [facility] ENDOSCOPY;  Service: Endoscopy;  Laterality: N/A;   HOLMIUM LASER APPLICATION Bilateral 05/20/2015   Procedure: HOLMIUM LASER APPLICATION;  Surgeon: Ricardo Likens, MD;  Location: [facility];  Service: Urology;  Laterality: Bilateral;   IMPLIMENTATION OF A HYPOGLOSSAL NERVE STIMULATOR  12/08/2017   OSA    IR GASTROSTOMY TUBE MOD SED  01/18/2024   IR RADIOLOGIST EVAL & MGMT  02/13/2024   LEFT HEART CATHETERIZATION WITH CORONARY ANGIOGRAM N/A 09/06/2013   Procedure: LEFT HEART CATHETERIZATION WITH CORONARY ANGIOGRAM;  Surgeon: Victory LELON Claudene DOUGLAS, MD;  Location: [facility] CATH LAB;  Service: Cardiovascular;  Laterality: N/A;   LEFT KNEE CAP  SURGERY ABOUT 40 YRS AGO  ? 1970     SPINE SURGERY  Desk in neck   STONE EXTRACTION WITH BASKET Left 03/19/2014   Procedure: STONE EXTRACTION WITH BASKET;  Surgeon: Mickey Ricardo KATHEE Likens, MD;  Location: [facility];  Service: Urology;  Laterality: Left;   TRANSURETHRAL RESECTION OF PROSTATE N/A 08/25/2021   Procedure: TRANSURETHRAL RESECTION OF THE PROSTATE (TURP);  Surgeon: Likens Ricardo, MD;  Location: [facility];  Service: Urology;  Laterality: N/A;  1 HR   Patient Active Problem List   Diagnosis Date Noted   Presence of heart assist device (HCC) 04/16/2024   Interstitial pulmonary disease, unspecified (HCC) 04/16/2024   Abnormal PET scan of lung 03/29/2024   Gastrointestinal tube present (HCC) 02/18/2024   Granulation tissue of site of gastrostomy 02/06/2024   Protein-calorie malnutrition, severe 01/23/2024   Multifocal pneumonia 01/22/2024   Chronic pulmonary aspiration 12/29/2023   Silent aspiration 12/29/2023   Moderate protein-calorie malnutrition (weight for age 26-74% of standard) 12/29/2023   Bilateral leg weakness 12/29/2023   Chronic constipation 12/29/2023   Somnolence 12/29/2023   Frequent falls 12/29/2023   Feeding  difficulties, unspecified 12/20/2023   Shortness of breath 12/20/2023   Abnormal CT of the chest 10/10/2023   Carotid stenosis 05/11/2023   Spinal stenosis in cervical region 03/13/2023   Osteoarthritis of spine with radiculopathy, cervical region 07/14/2022   Cervicogenic headache 11/30/2021   Chronic neck pain 11/30/2021   Weight loss, non-intentional    Dysphagia    Polyp of colon    Prostatic hyperplasia 08/25/2021   Chronic insomnia 03/19/2020   Peripheral neuropathy 03/19/2020   Iron deficiency anemia 01/06/2020   Allergic dermatitis 07/08/2019   Osteoarthritis of left knee 02/26/2019   History of colonic polyps    History of duodenal ulcer    Chronic kidney disease, stage 3b (HCC)    Bilateral chronic knee pain 11/09/2018   Epistaxis 10/02/2018   Subjective tinnitus, bilateral 10/02/2018   Chronic pain of right thumb 02/22/2018   Acute bilateral thoracic back pain 01/23/2018   Osteoporosis without current pathological fracture 02/10/2017   Hypothyroidism 11/02/2016   Thoracic compression fracture (HCC) 09/06/2016   PVC's (premature ventricular contractions) 07/07/2016   Hypoalbuminemia 06/28/2016   IgA nephropathy 06/07/2016   Acute buttock pain 05/23/2016  MDD (major depressive disorder), recurrent episode, moderate (HCC) 11/20/2015   OSA (obstructive sleep apnea) 02/05/2014   Orthostatic hypotension 02/07/2013   Scotoma involving central area 01/29/2013   Optic neuropathy 01/28/2013   HEMATURIA UNSPECIFIED 01/21/2010   HLD (hyperlipidemia) 07/05/2006   Generalized anxiety disorder 07/05/2006   CAD (coronary artery disease) 07/05/2006   GERD 07/05/2006    PCP: Dr. Greig Ring    REFERRING PROVIDER: Dr. Arley Helling   REFERRING DIAG: M54.6 (ICD-10-CM) - Acute bilateral thoracic back pain  THERAPY DIAG:  Abnormality of gait  Balance disorder  Muscle weakness (generalized)  Pain in thoracic spine  Cervicalgia  Rationale for Evaluation and Treatment:  Rehabilitation  ONSET DATE: May 2025    SUBJECTIVE:                                                                                                                                                                                                         SUBJECTIVE STATEMENT    Pt arrives to PT from SLP treatment. Continued discuss started with SLP about proper physical activity levels to improve overall health status. Strongly encouraged pt to limit activity outside of medical appointment   Pt denies any falls since the last visit. Has not passed out since last PT visit, but states that he is getting dizzy.  Reports that MD has increased BP medication(Midodrine ) to 10mg  3 times per day . Pt continues to report mild to moderate dizziness this AM, ut it is improving this afternoon prior to PT treatment.  Reports pain/stiffness in neck 5/10 today. Does report cervicogenic HA last night.    PERTINENT HISTORY:   73yoM referred to physical therapy for ongoing neck pain following cervical fusion in May 2020 and newly developed thoracic pain from T12 compression fracture that occurred sometime in Spring of 2025 (associated radicular pain pattern). Pt has pain localized to upper traps and suboccipitals. Pt attempted DC of prednisone  and gabapentin  two weeks prior to evaluation here, severe decline in function due to pain, barely able to walk for two weeks. He describes feeling increased fatigue and weakness to the point where he struggled to walk. Pt also reported repeated falls in the setting of orthostatic hypotension with him experiencing syncope when he stands up from a seated position. He describes feeling a burning sensation that is also localized to his scapulae and that it occurs when he is doing UE activities like washing his car that is likely the radicular pain from thoracic vertebrae compression fracture.  PAIN: Are you having pain? 03/05/2024: 6/10 today in the  neck  PRECAUTIONS: None Pt has  thoracic compression fractures and is s/p cervical spine surgery   WEIGHT BEARING RESTRICTIONS: No  FALLS:   Has patient fallen in last 6 months? Yes. Number of falls 6, pt reports that falls are happening in the context of syncope when standing up from a seated position (Orthostatic Hypotension) , where he blacks out and falls. He is following up with his PCP, Dr. Karlene who is working with him on stabilizing his blood pressure and she has recently taken him off Gabapentin  to see if this helps. Pt reports that he does note an improvement in his symptoms with less light headedness since going off medication.   LIVING ENVIRONMENT: Lives with: lives with their spouse Lives in: House/apartment Stairs: No Has following equipment at home: Single point cane and Environmental Consultant - 4 wheeled  OCCUPATION: Retired     PLOF: Independent  PATIENT GOALS: Improve balance, decrease falls.       OBJECTIVE:   Note: Objective measures were completed at evaluation unless otherwise noted. Reassessment Measures 02/09/24: -eyes closed balance, normal stance: <5sec with retropulsion each time, ModA falls recovery  -narrow stance eyes open balance: <10sec with LOB, ModA falls recovery  -Long distance AMB assessment c SPC: 135ft with 2 DOE standing breaks ~15 seconds each, 15sec; crossover gait intermittently without LOB; -standing vitals taken at end of walk: stable and comparable to prior seated vitals.  -cervical rotation ROM: 50deg Rt, 53 degree left  -shoulder flexion MMT: 5/5 Rt, 4+/5 left; shoulder ABDCT MMT: 5/5 bilat  -ankle DF assessment: can perform >10x full range while leaning against wall (adequate for gait needs)  -sensory testing about ankles, lower legs: (feels intact, but pt reports constance N/T below ankle to his toes)  Standing BP assessment: 02/28/24 -124/80mmHg 95bpm standing @ 0 -122/50mmHg 90bpm standing @ 1 110/41mmHg 92bpm standing @ 2   Cervical ROM  1/20: Flexion: 30:  extension: 30. Rot R: 45, Rot L40: SB L: 20. SB R: 15   TREATMENT DATE 05/07/24:   Sitting 115/77 HR 63 Standing 0 : 95/68 HR 73 Standing 2 min 101/78 HR 75  No orthostatic hypotension s/s on this afternoon.   Manual:  Seated STM for TrP management and improved muscle extensibility in the Upper traps, parascapular muscles, Cervical paraspinals   x 20 min total   AAROM cervical rotation 2 x 20sec hold bil  AAROM Side bending 2 x 30 sec hold bil  Side direciton AAROM  cerviical rotation with towel 2 x 20 sec bil. Moderate instruction to prevent blocking airway.  Cervical extension with snag using towel 2 x 15  UT stretch 2 x 20 sec bil  Pt left clinic ambulatory with no orthostatic s/s   PATIENT EDUCATION:  Education details: Pt progressing so far with cardio and balance activity.     Dangers of prolonged low BP.   Person educated: Patient Education method: Medical Illustrator Education comprehension: verbalized understanding, returned demonstration, and verbal cues required  HOME EXERCISE PROGRAM: Access Code: [email or web address] URL: [email or web address] Date: 04/17/2024 Prepared by: Lonni Gainer  Exercises - Marching Near Counter  - 1 x daily - 3-5 x weekly - 2 sets - 10 reps - 3 sec hold - Single leg balance near counter   - 1 x daily - 3-5 x weekly - 3 sets - 30 second hold - Heel Toe Raises with Counter Support  - 1 x daily - 3-5 x weekly - 2 sets - 15 reps -  Sit to Stand with Arms Crossed  - 1 x daily - 3-5 x weekly - 2 sets - 10 reps  Access Code: [email or web address] URL: [email or web address] Date: 05/03/2024 Prepared by: Massie Dollar  Exercises - Seated Bilateral Shoulder Flexion Towel Slide at Table Top  - 1 x daily - 7 x weekly - 2 sets - 10 reps - 3-5 sec hold - Seated Upper Trapezius Stretch  - 1 x daily - 7 x weekly - 3 sets - 10 reps - Seated Thoracic Lumbar Extension  - 1 x daily - 7 x weekly - 3 sets - 10 reps - Seated Thoracic  Extension AROM  - 1 x daily - 7 x weekly - 3 sets - 10 reps - Seated Cervical Rotation AROM  - 1 x daily - 7 x weekly - 3 sets - 10 reps - Seated Assisted Cervical Rotation with Towel  - 1 x daily - 7 x weekly - 3 sets - 10 reps - Cervical Extension AROM with Strap  - 1 x daily - 7 x weekly - 3 sets - 10 reps  ASSESSMENT:  CLINICAL IMPRESSION:   PT treatment focused on cervical ROM and improved muscle extensibility in Cspine and upper thoracic region.  Tolerated treatment well and reports decreased upper back/shoulder tightness upon completion. Improved BP on this day, but elected to perform all interventions in sitting to prevent orthostatic hypotension following prolonged supine. Pt reports significant reduction in cervical tension following PT treatment on this day,  Pt will continue to benefit from skilled physical therapy intervention to address impairments, improve QOL, and attain therapy goals.      OBJECTIVE IMPAIRMENTS: decreased balance, decreased ROM, decreased strength, hypomobility, impaired flexibility, impaired UE functional use, and pain.   ACTIVITY LIMITATIONS: carrying, lifting, standing, bathing, dressing, reach over head, and hygiene/grooming PARTICIPATION LIMITATIONS: cleaning, laundry, shopping, community activity, and yard work PERSONAL FACTORS: Age and 3+ comorbidities: CAD, Depression, are also affecting patient's functional outcome.  REHAB POTENTIAL: Good CLINICAL DECISION MAKING: Stable/uncomplicated EVALUATION COMPLEXITY: Low   GOALS: Goals reviewed with patient? No  SHORT TERM GOALS: Target date: 05/14/2024    Patient will demonstrate undestanding of home exercise plan by performing exercises correctly with evidence of good carry over with min to no verbal or tactile cues .   Baseline: NT 12/25/23: Performing exercises independently  Goal status: ACHIEVED   2.  Patient will demonstrate understanding of how to modify exercises to decrease thoracic and  cervical pain exacerbation for improved function and to decrease incidence of pain flare ups.  Baseline: slightly improved. Will continue to address.  Goal status: In progress   LONG TERM GOALS: Target date: 05/28/2024  Patient will show a statistically significant improvement in his neck function as evidence by >=7.5 pt decrease in her neck disability index score to better be able to move neck to improve visual field while driving to avoid accidents. Vernel et al, 2009) Baseline: 33/50 (66%); 02/09/24: 66% 03/01/2024: 50% 04/12/24: 28 (56%) 04/30/24: 31 (61%)  Goal status: NOT MET     2.  Patient will increase cervical mobility by  >=5 degrees AROM for all planes of motion (flexion, extension, side bending, and rotation) for improved cervical function and evidence of pain reduction. Baseline: Cervical AROM Flex 30, Ext 30, Rot R/L 40/40, SB R/L 35/35; 02/09/24: Rt rotation 50 degrees, Left rotation: 53 degress  03/01/2024: Flex:  30*   Ext: 20*      Rot L: 32*     Rot R:  30*      SB L: 20*       SB R: 15* 1/20: Flexion: 30: extension: 30. Rot R: 45, Rot L40: SB L: 20. SB R: 15  Goal status: in progress   3.  Patient will improve shoulder and periscapular strength by 1/3 grade MMT (4- to 4) for improved cervical stability and UE function to carry out upper extremity activities like washing his car and gardening without being limited by pain and discomfort.  Baseline: Shoulder Flex R/L 4/4, Shoulder Abd R/L 4/4; 10/31: shoulder flexion Rt: 5/5, left 4+/5; shoulder ABDCT 5/5 bilat  03/01/2024: Shld Flex R: 5 Shld Flex L: 4+ , Shld abd R: 4 +  Shld Abd L: 4 Goal status: MET    4.  Patient will reduce neck and thoracic pain to <=4/10 NRPS while performing UE activities like washing his car and doing yard work as evidence of improved cervical and thoracic function.  Baseline: 10/10 cervical paraspinal and rhomboid pain with UE activity; up to a 9/10 intermittently with activity   03/01/2024: 8/10  NPS UE activity 04/12/24: 8/10 NPS with UE activity 04/30/24: 8/10 NPS with UE activity (washing Car; cleaning house) Goal status: NOT MET     PLAN:  PT FREQUENCY: 1-2x/week PT DURATION: 4 weeks PLANNED INTERVENTIONS: [id number]- PT Re-evaluation, 97750- Physical Performance Testing, 97110-Therapeutic exercises, 97530- Therapeutic activity, 97112- Neuromuscular re-education, 97535- Self Care, [id number]- Manual therapy, [id number]- Gait training, [id number]- Canalith repositioning, [id number]- Aquatic Therapy, [id number]- Electrical stimulation (unattended), [phone]- Electrical stimulation (manual), [id number]- Traction (mechanical), 20560 (1-2 muscles), 20561 (3+ muscles)- Dry Needling, Patient/Family education, Balance training, Taping, Joint mobilization, Joint manipulation, Spinal manipulation, Spinal mobilization, Vestibular training, DME instructions, Cryotherapy, and Moist heat  PLAN FOR NEXT SESSION:    -continue to address cervical ROM/pain     Massie FORBES Dollar PT ,DPT Physical Therapist- [place]   05/07/24, 4:05 PM   "

## 2024-05-07 NOTE — Therapy (Unsigned)
 " OUTPATIENT SPEECH LANGUAGE PATHOLOGY  SWALLOW TREATMENT     Patient Name: Shawn Lam MRN: [id number] DOB:Aug 19, 1950, 74 y.o., male Today's Date: 05/07/2024  PCP: Greig Ring, MD REFERRING PROVIDER: Greig Ring, MD    End of Session - 05/07/24 1644     Visit Number 28    Number of Visits 43    Date for Recertification  06/18/24    Authorization Type Medicare Part A    Progress Note Due on Visit 30    SLP Start Time 1530    SLP Stop Time  1615    SLP Time Calculation (min) 45 min    Activity Tolerance Patient tolerated treatment well          Past Medical History:  Diagnosis Date   Actinic keratosis 02/04/2021   right lateral neck   Anemia    Anxiety    Asymptomatic LV dysfunction    50-55% by echo 06/2016   BCC (basal cell carcinoma of skin) 08/05/2020   left neck infra auricular - nodular pattern - treated with [facility] 09/17/2020   Berger's disease    Blood transfusion without reported diagnosis 04/11/2018   CAD (coronary artery disease) 06/2005   PCI of LAD and RCA   Cancer (HCC)    Cataract 04/11/2016   04/11/2016   Depression    Dilated aortic root    4cm at sinus of Valsalva   Hematuria    Hyperlipidemia    Hypothyroidism    Insomnia    Left ureteral stone    Orthostatic hypotension    negative tilt table   OSA (obstructive sleep apnea)    moderate with AHI 17/hr, uses CPAP nightly(01/15/19 - has Inspire implant)   Osteoporosis 04/11/2016   PVC's (premature ventricular contractions) 07/07/2016   Renal disorder    PT IS SEEING DR. BETSEY- NEPHROLOGIST   Rosacea    Sleep apnea 04/11/2016   04/11/2016   Ulcer 04/11/2018   Urticaria    Past Surgical History:  Procedure Laterality Date   ANTERIOR CERVICAL DECOMP/DISCECTOMY FUSION N/A 03/13/2023   Procedure: CERVICAL FOUR-FIVE, CERVICAL FIVE-SIX ANTERIOR CERVICAL DISCECTOMY/DECOMPRESSION FUSION;  Surgeon: Onetha Kuba, MD;  Location: [facility] OR;  Service: Neurosurgery;  Laterality: N/A;  3C   CARDIAC  CATHETERIZATION  09/06/2013   [facility]   CARDIAC CATHETERIZATION     COLONOSCOPY WITH PROPOFOL  N/A 01/21/2019   Procedure: COLONOSCOPY WITH PROPOFOL ;  Surgeon: Jinny Carmine, MD;  Location: [facility];  Service: Endoscopy;  Laterality: N/A;  sleep apnea   COLONOSCOPY WITH PROPOFOL  N/A 10/19/2021   Procedure: COLONOSCOPY WITH PROPOFOL ;  Surgeon: Jinny Carmine, MD;  Location: [facility] ENDOSCOPY;  Service: Endoscopy;  Laterality: N/A;   CORONARY ANGIOPLASTY  2004   STENT PLACEMENT   CYSTOSCOPY WITH RETROGRADE PYELOGRAM, URETEROSCOPY AND STENT PLACEMENT Left 03/19/2014   Procedure: CYSTOSCOPY WITH RETROGRADE PYELOGRAM, URETEROSCOPY AND STENT PLACEMENT;  Surgeon: Mickey Ricardo KATHEE Alvaro, MD;  Location: [facility] ORS;  Service: Urology;  Laterality: Left;   CYSTOSCOPY WITH RETROGRADE PYELOGRAM, URETEROSCOPY AND STENT PLACEMENT Bilateral 05/20/2015   Procedure:  CYSTOSCOPY WITH BILATERAL RETROGRADE PYELOGRAM,RIGHT  DIAGNOSTIC URETEROSCOPY ,LEFT URETEROSCOPY WITH HOLMIUM LASER  AND BILATERAL  STENT PLACEMENT ;  Surgeon: Ricardo Alvaro, MD;  Location: [facility];  Service: Urology;  Laterality: Bilateral;   DRUG INDUCED ENDOSCOPY N/A 10/20/2017   Procedure: DRUG INDUCED SLEEP ENDOSCOPY;  Surgeon: Carlie Clark, MD;  Location: [facility];  Service: ENT;  Laterality: N/A;   EP IMPLANTABLE DEVICE N/A 05/13/2016   Procedure:  Loop Recorder Insertion;  Surgeon: Elspeth JAYSON Sage, MD;  Location: [facility] INVASIVE CV LAB;  Service: Cardiovascular;  Laterality: N/A;   ESOPHAGOGASTRODUODENOSCOPY N/A 10/19/2021   Procedure: ESOPHAGOGASTRODUODENOSCOPY (EGD);  Surgeon: Jinny Carmine, MD;  Location: [facility] ENDOSCOPY;  Service: Endoscopy;  Laterality: N/A;   ESOPHAGOGASTRODUODENOSCOPY (EGD) WITH PROPOFOL  N/A 11/30/2018   Procedure: ESOPHAGOGASTRODUODENOSCOPY (EGD) WITH PROPOFOL ;  Surgeon: Therisa Bi, MD;  Location: [facility] ENDOSCOPY;  Service: Gastroenterology;  Laterality: N/A;   ESOPHAGOGASTRODUODENOSCOPY (EGD) WITH PROPOFOL  N/A  12/01/2018   Procedure: ESOPHAGOGASTRODUODENOSCOPY (EGD) WITH PROPOFOL ;  Surgeon: Jinny Carmine, MD;  Location: [facility] ENDOSCOPY;  Service: Endoscopy;  Laterality: N/A;   HOLMIUM LASER APPLICATION Bilateral 05/20/2015   Procedure: HOLMIUM LASER APPLICATION;  Surgeon: Ricardo Likens, MD;  Location: [facility];  Service: Urology;  Laterality: Bilateral;   IMPLIMENTATION OF A HYPOGLOSSAL NERVE STIMULATOR  12/08/2017   OSA    IR GASTROSTOMY TUBE MOD SED  01/18/2024   IR RADIOLOGIST EVAL & MGMT  02/13/2024   LEFT HEART CATHETERIZATION WITH CORONARY ANGIOGRAM N/A 09/06/2013   Procedure: LEFT HEART CATHETERIZATION WITH CORONARY ANGIOGRAM;  Surgeon: Victory LELON Claudene DOUGLAS, MD;  Location: [facility] CATH LAB;  Service: Cardiovascular;  Laterality: N/A;   LEFT KNEE CAP  SURGERY ABOUT 40 YRS AGO  ? 1970     SPINE SURGERY  Desk in neck   STONE EXTRACTION WITH BASKET Left 03/19/2014   Procedure: STONE EXTRACTION WITH BASKET;  Surgeon: Mickey Ricardo KATHEE Likens, MD;  Location: [facility];  Service: Urology;  Laterality: Left;   TRANSURETHRAL RESECTION OF PROSTATE N/A 08/25/2021   Procedure: TRANSURETHRAL RESECTION OF THE PROSTATE (TURP);  Surgeon: Likens Ricardo, MD;  Location: [facility];  Service: Urology;  Laterality: N/A;  1 HR   Patient Active Problem List   Diagnosis Date Noted   Presence of heart assist device (HCC) 04/16/2024   Interstitial pulmonary disease, unspecified (HCC) 04/16/2024   Abnormal PET scan of lung 03/29/2024   Gastrointestinal tube present (HCC) 02/18/2024   Granulation tissue of site of gastrostomy 02/06/2024   Protein-calorie malnutrition, severe 01/23/2024   Multifocal pneumonia 01/22/2024   Chronic pulmonary aspiration 12/29/2023   Silent aspiration 12/29/2023   Moderate protein-calorie malnutrition (weight for age 69-74% of standard) 12/29/2023   Bilateral leg weakness 12/29/2023   Chronic constipation 12/29/2023   Somnolence 12/29/2023   Frequent falls 12/29/2023   Feeding difficulties, unspecified  12/20/2023   Shortness of breath 12/20/2023   Abnormal CT of the chest 10/10/2023   Carotid stenosis 05/11/2023   Spinal stenosis in cervical region 03/13/2023   Osteoarthritis of spine with radiculopathy, cervical region 07/14/2022   Cervicogenic headache 11/30/2021   Chronic neck pain 11/30/2021   Weight loss, non-intentional    Dysphagia    Polyp of colon    Prostatic hyperplasia 08/25/2021   Chronic insomnia 03/19/2020   Peripheral neuropathy 03/19/2020   Iron deficiency anemia 01/06/2020   Allergic dermatitis 07/08/2019   Osteoarthritis of left knee 02/26/2019   History of colonic polyps    History of duodenal ulcer    Chronic kidney disease, stage 3b (HCC)    Bilateral chronic knee pain 11/09/2018   Epistaxis 10/02/2018   Subjective tinnitus, bilateral 10/02/2018   Chronic pain of right thumb 02/22/2018   Acute bilateral thoracic back pain 01/23/2018   Osteoporosis without current pathological fracture 02/10/2017   Hypothyroidism 11/02/2016   Thoracic compression fracture (HCC) 09/06/2016   PVC's (premature ventricular contractions) 07/07/2016   Hypoalbuminemia 06/28/2016   IgA nephropathy 06/07/2016   Acute buttock  pain 05/23/2016   MDD (major depressive disorder), recurrent episode, moderate (HCC) 11/20/2015   OSA (obstructive sleep apnea) 02/05/2014   Orthostatic hypotension 02/07/2013   Scotoma involving central area 01/29/2013   Optic neuropathy 01/28/2013   HEMATURIA UNSPECIFIED 01/21/2010   HLD (hyperlipidemia) 07/05/2006   Generalized anxiety disorder 07/05/2006   CAD (coronary artery disease) 07/05/2006   GERD 07/05/2006    ONSET DATE: 03/2023 following ACDF; Date of referral 12/28/2023: date of this referral 02/01/2024  REFERRING DIAG:  T17.908D (ICD-10-CM) - Chronic pulmonary aspiration, subsequent encounter  T17.900D (ICD-10-CM) - Silent aspiration, subsequent encounter  E44.0 (ICD-10-CM) - Moderate protein-calorie malnutrition  R13.10 (ICD-10-CM) -  Dysphagia, unspecified type    THERAPY DIAG:  Dysphagia, oropharyngeal phase  Rationale for Evaluation and Treatment Rehabilitation  SUBJECTIVE:   PERTINENT HISTORY and DIAGNOSTIC FINDINGS:  Pt is a 74 yo male w/ ACDF surgery 03/2023.  PMH: GERD(Not on a PPI per chart), Actinic keratosis (02/04/2021), Anemia, Anxiety, Asymptomatic LV dysfunction, BCC (basal cell carcinoma of skin) (08/05/2020), Berger's disease, CAD (coronary artery disease) (06/2005), resting tremor, Depression, Dilated aortic root (HCC), Hematuria, Hyperlipidemia, Hypothyroidism, Insomnia, Left ureteral stone, Orthostatic hypotension, OSA (obstructive sleep apnea), PVC's (premature ventricular contractions) (07/07/2016), Renal disorder, Rosacea, and Urticaria.  Pt is followed by Cardiology for Coronary Artery Stenosis currently; also followed for Migraines.     EGD in 2023 revealed Gastritis.      CT of Chest 10/2023: 1. Interim development of patchy consolidation and ground-glass   disease most evident at the right middle lobe and right lower lobes   with additional areas of mild ground-glass disease and scattered   clustered nodularity, findings are suspicious for multifocal   infection/pneumonia. Imaging follow-up to resolution is recommended.   2. Emphysema.   3. Aortic atherosclerosis.            Dysphagia - reported by pt as Chronic, ever since neck surgery in December 2024. Pt was seen in the emergency room s/p Fall, notes detailing that he slipped on soap in the shower, fell backwards on right side hit head and neck against shower seat, possible brief loss of consciousness, per chart.  ACDF 03/2023 per chart.  Since the neck surgery in 03/2023, he has noted food, liquids and pill getting stuck on the right side of my throat more.  I used to cough a lot more but not as much now.  I cough up what gets stuck..  He has lost significant weight per chart notes due to decreased eating.  Pt endorsed using his home O2 when he  feels SOB.   MBSS 12/26/2023 Patient appears to present w/ Chronic, moderate-severe Pharyngeal phase dysphagia per this assessment today. It appeared there was some impact from changes in the Cervical Esophagus s/p ACDF surgery(03/2023 per chart notes) including structural/tissue changes in/aournd the CP segment and cervical esophagus. Pt with intermittent silent aspiration of thin liquids.   Recommend continue current diet of easy to eat foods at home; thin liquids via cup- at this time. Oral care Before any oral intake. Swallow Strategies Recommended: No straws, moistened foods, SMALL bites/sips, Head Turn RIGHT w/ slight chin tuck, multiple swallows to aid clearing, AND strong Cough/throat clear b/t bites/sips and at end of meal.  D/t the Chronicity of pt's Pharyngeal phase Dysphagia w/ weight loss and pulmonary impact per recent Chest CT, pt and Family may need to discuss alternative means of nutrition/hydration as support w/ his Medical Team/PCP, while seeking swallow therapy.  Received ENT report dated 01/12/2024  Laryngoscopy revealed  Moderate vocal fold bowing bilaterally and moderate glottic gamp with phonation Impression/plan: Aspiration on MBSS and significant weight loss since ACDF last year. TVF shows moderate sized glottic gap on laryngoscoy but mobile vocal folds bilaterally. Pooling in pyriform on MBSS but no significant pooling of secretions on laryngoscopy. I feel that between speech therpay and possibly bilateral injection laryngoplasty, hopefully his swallowing will significantly improve. Will make referral to Dr Brien at [facility].   Pt and his wife report appt with Dr Brien in January 2026.  10/10/205 PEG placed  01/22/2024 thru 01/24/2024 Hospitalized d/t fall and confusion CT of the chest on arrival showed multifocal pneumonia and possible free air. When reviewed further with interventional radiology team it was determined this was an appropriate amount of free air  secondary to recent PEG tube placement. Patient was initiated on antibiotic therapy for pneumonia and improved significantly.   02/07/2024 CT Chest 1. Evolving multifocal inflammatory infiltrates, progressive within the right upper lobe and improved within the lung bases bilaterally. Recommend appropriate clinical management and imaging follow-up per clinical course. 2. Mild emphysema. Given emphysema as an independent lung cancer risk factor, consider evaluation for a low-dose CT lung cancer screening program if the patient is 74 years old and otherwise eligible.  04/18/2024 Modified Barium Swallow Study Pt currently presents mild oropharyngeal dysphagia with no aspiration observed during today's study. Pt's oral phase is c/b decreased lingual manipulation of boluses resulting in oral residue prompting second swallow.  During this second swallow, pt with reduced epiglottic deflection vs initial swallow of bolus. Penetration observed during this second swallow intermittently but didn't collect and remained a trace undercoating on epiglottis. Pt with significantly improved pharyngeal phase with more timely swallow initiation observed at the level of vallecula, increased base of tongue strength with only trace residue, overall improve pharyngeal strength with trace residue for thin liquids, nectar thick liquids and minimal collection of residue when consuming puree and graham cracker, improved epiglottic deflection (complete during initial swallows of each bolus), improved passage of all boluses thru UES (including whole barium tablet). Based on this study, pt appears appropriate to return to PO intake with strict aspiration precautions. Will review study, results and recommendations with pt at next outpatient ST session. Recommend pt continue to have appt with Dr Brien at [facility] on January 26th.    PAIN:  Are you having pain? No  FALLS: Has patient fallen in last 6 months?  See PT  evaluation for details  LIVING ENVIRONMENT: Lives with: lives with their spouse Lives in: House/apartment  PLOF:  Level of assistance: Independent with ADLs, Independent with IADLs Employment: Retired   PATIENT GOALS  to improve swallow function, nutrition and hydration, decrease aspiration risks  SUBJECTIVE STATEMENT: Pt pleasant, pt's wife attended per this writer's request Pt accompanied by: self, his wife   OBJECTIVE:   TODAY'S TREATMENT:  Skilled treatment session focused on pt's dysphagia goals. SLP facilitated the session by providing the following interventions:     Per chart, pt continues to loss weight despite increasing PO intake (along with his 6 cartons of formula) and he reports increased consumption of thin liquid protein shakes. Pt's report of follow thru with SLP recommendations remain inconsistent therefore this writer reached out to his wife to attend session to help with carryover of diet recommendations, adherence to strict aspiration precautions during consumption.    Pt currently reports he has been consuming 3 bottles of muscle milk therefore promoting increase use of oropharyngeal  abilities (in addition to completing his ST HEP). Pt's wife reports pt is sipping on muscle milk throughout the day, barely consuming 1. He also reports consuming cottage cheese when current recommendations are for thin liquid intake for habitual use of strict aspiration precautions (1 swallow). Pt unable to recall increased pharyngeal residue with consumption of puree textures. She also reports that pt is walking on the treadmill for > 1 hour every morning. Pt describes confusion over number of calories spent in exercise vs number of calories consumed with each carton of feedings. This clinical research associate worked with pt and his wife to further explain strict aspiration precautions, characteristics of pt's oropharyngeal dysphagia, increased aspiration risk with PO consumption and safe swallow  strategy of improving oral management of bolus to produce 1 swallow (instead of 2 swallows back to back as pt's second swallow is largely un-effective in airway protection. With pt and his wife, schedule of feedings and PO intake were created to include soft solids (eggs, salmon with mayo) and thin liquids. They voiced understanding. Also provided instruction in using applesauce with his medicines d/t pt report of pills feeling intermittently stuck.   To continue to promote accurate carryover of dysphagia recommendations and reduce aspiration risk, this writer asked pt's wife to attend next session on Thursday 05/09/2024 at 8am. She stated she would be there.   PATIENT EDUCATION: Education details: as above Person educated: Patient and Spouse Education method: Chief Technology Officer Education comprehension: needs further education  HOME EXERCISE PROGRAM:     See above   GOALS: Goals reviewed with patient? Yes   SHORT TERM GOALS: Target date: 10 sessions            Updated 02/28/2024            Updated 02/09/2024            Update 03/26/2024 With Mod I, pt will perform compensatory swallow strategies (chin tuck, multiple swallows, head turn) to eliminate s/s of aspiration when consuming least restrictive diet. Baseline: Goal status: MET: no overt s/sx with water  consumption this date   2.  With Min A, pt will complete (supraglottic, swallow, Mednelson maneuver, effortful swallow) to improve oral motor weakness, tongue base retraction, hyolarnygeal excursion, airway protection and/or clearance of the bolus through the pharynx. Baseline:  Goal status: IN PROGRESS: progress made; moderate A: Progress made, fluctuating between supervision and independent cues   3. Pt will demonstrate overt s/s of aspiration when consuming ice chips.              Baseline: Goal status: IN PROGRESSING: no overt s/s of aspiration observed during today's session: Intermittent overt s/s observed     LONG  TERM GOALS: Target date: 06/18/2024             Updated 02/28/2024             Updated: 02/09/2024             Updated: 03/26/2024    With Mod I, pt will demonstrate the ability to adequately self-monitor swallowing skills and perform appropriate compensatory techniques to reduce s/s of aspiration and to safely consume least restrictive diet.     Baseline:  Goal status: IN PROGRESS: continues to be appropriate goal   2.  Pt will participate in repeat instrumental swallow evaluation to assess least restrictive diet.  Baseline:  Goal status: IN PROGRESS: continues to be appropriate goal: Modified Barium scheduled completed 11/21; would benefit from repeat MBSS  ASSESSMENT:  CLINICAL IMPRESSION:  Patient is a 74 y.o. male who was seen today for a clinical swallow treatment d/t severe pharyngeal phase dysphagia following ACDF (03/2023).    See the above treatment note for details.   OBJECTIVE IMPAIRMENTS include dysphagia. These impairments are limiting patient from safety when swallowing. Factors affecting potential to achieve goals and functional outcome are severity of impairments and malnutrition. Patient will benefit from skilled SLP services to address above impairments and improve overall function.  REHAB POTENTIAL: Good  PLAN: SLP FREQUENCY: 1-2x/week  SLP DURATION: 12 weeks  PLANNED INTERVENTIONS: Aspiration precaution training, Pharyngeal strengthening exercises, Diet toleration management , Oral motor exercises, SLP instruction and feedback, Compensatory strategies, and Patient/family education   Lyndal Reggio B. Rubbie, M.S., CCC-SLP, CBIS Speech-Language Pathologist Certified Brain Injury Specialist [facility]  [facility] [phone] Ascom [phone] Fax [phone]               "

## 2024-05-09 ENCOUNTER — Ambulatory Visit: Admitting: Physical Therapy

## 2024-05-09 ENCOUNTER — Encounter: Admitting: Speech Pathology

## 2024-05-09 ENCOUNTER — Ambulatory Visit

## 2024-05-09 ENCOUNTER — Ambulatory Visit: Admitting: Speech Pathology

## 2024-05-09 DIAGNOSIS — M6281 Muscle weakness (generalized): Secondary | ICD-10-CM

## 2024-05-09 DIAGNOSIS — M546 Pain in thoracic spine: Secondary | ICD-10-CM

## 2024-05-09 DIAGNOSIS — M542 Cervicalgia: Secondary | ICD-10-CM

## 2024-05-09 DIAGNOSIS — R269 Unspecified abnormalities of gait and mobility: Secondary | ICD-10-CM

## 2024-05-09 DIAGNOSIS — R2689 Other abnormalities of gait and mobility: Secondary | ICD-10-CM

## 2024-05-09 DIAGNOSIS — R1312 Dysphagia, oropharyngeal phase: Secondary | ICD-10-CM

## 2024-05-09 NOTE — Therapy (Signed)
 " OUTPATIENT SPEECH LANGUAGE PATHOLOGY  SWALLOW TREATMENT     Patient Name: Shawn Lam MRN: [id number] DOB:05-29-1950, 74 y.o., male Today's Date: 05/09/2024  PCP: Greig Ring, MD REFERRING PROVIDER: Greig Ring, MD    End of Session - 05/09/24 1109     Visit Number 29    Number of Visits 43    Date for Recertification  06/18/24    Authorization Type Medicare Part A    Progress Note Due on Visit 30    SLP Start Time 0800    SLP Stop Time  0845    SLP Time Calculation (min) 45 min    Activity Tolerance Patient tolerated treatment well          Past Medical History:  Diagnosis Date   Actinic keratosis 02/04/2021   right lateral neck   Anemia    Anxiety    Asymptomatic LV dysfunction    50-55% by echo 06/2016   BCC (basal cell carcinoma of skin) 08/05/2020   left neck infra auricular - nodular pattern - treated with [facility] 09/17/2020   Berger's disease    Blood transfusion without reported diagnosis 04/11/2018   CAD (coronary artery disease) 06/2005   PCI of LAD and RCA   Cancer (HCC)    Cataract 04/11/2016   04/11/2016   Depression    Dilated aortic root    4cm at sinus of Valsalva   Hematuria    Hyperlipidemia    Hypothyroidism    Insomnia    Left ureteral stone    Orthostatic hypotension    negative tilt table   OSA (obstructive sleep apnea)    moderate with AHI 17/hr, uses CPAP nightly(01/15/19 - has Inspire implant)   Osteoporosis 04/11/2016   PVC's (premature ventricular contractions) 07/07/2016   Renal disorder    PT IS SEEING DR. BETSEY- NEPHROLOGIST   Rosacea    Sleep apnea 04/11/2016   04/11/2016   Ulcer 04/11/2018   Urticaria    Past Surgical History:  Procedure Laterality Date   ANTERIOR CERVICAL DECOMP/DISCECTOMY FUSION N/A 03/13/2023   Procedure: CERVICAL FOUR-FIVE, CERVICAL FIVE-SIX ANTERIOR CERVICAL DISCECTOMY/DECOMPRESSION FUSION;  Surgeon: Onetha Kuba, MD;  Location: [facility] OR;  Service: Neurosurgery;  Laterality: N/A;  3C   CARDIAC  CATHETERIZATION  09/06/2013   [facility]   CARDIAC CATHETERIZATION     COLONOSCOPY WITH PROPOFOL  N/A 01/21/2019   Procedure: COLONOSCOPY WITH PROPOFOL ;  Surgeon: Jinny Carmine, MD;  Location: [facility]ERY [facility];  Service: Endoscopy;  Laterality: N/A;  sleep apnea   COLONOSCOPY WITH PROPOFOL  N/A 10/19/2021   Procedure: COLONOSCOPY WITH PROPOFOL ;  Surgeon: Jinny Carmine, MD;  Location: [facility] ENDOSCOPY;  Service: Endoscopy;  Laterality: N/A;   CORONARY ANGIOPLASTY  2004   STENT PLACEMENT   CYSTOSCOPY WITH RETROGRADE PYELOGRAM, URETEROSCOPY AND STENT PLACEMENT Left 03/19/2014   Procedure: CYSTOSCOPY WITH RETROGRADE PYELOGRAM, URETEROSCOPY AND STENT PLACEMENT;  Surgeon: Mickey Ricardo KATHEE Alvaro, MD;  Location: [facility] ORS;  Service: Urology;  Laterality: Left;   CYSTOSCOPY WITH RETROGRADE PYELOGRAM, URETEROSCOPY AND STENT PLACEMENT Bilateral 05/20/2015   Procedure:  CYSTOSCOPY WITH BILATERAL RETROGRADE PYELOGRAM,RIGHT  DIAGNOSTIC URETEROSCOPY ,LEFT URETEROSCOPY WITH HOLMIUM LASER  AND BILATERAL  STENT PLACEMENT ;  Surgeon: Ricardo Alvaro, MD;  Location: [facility];  Service: Urology;  Laterality: Bilateral;   DRUG INDUCED ENDOSCOPY N/A 10/20/2017   Procedure: DRUG INDUCED SLEEP ENDOSCOPY;  Surgeon: Carlie Clark, MD;  Location: [facility];  Service: ENT;  Laterality: N/A;   EP IMPLANTABLE DEVICE N/A 05/13/2016   Procedure:  Loop Recorder Insertion;  Surgeon: Elspeth JAYSON Sage, MD;  Location: [facility] INVASIVE CV LAB;  Service: Cardiovascular;  Laterality: N/A;   ESOPHAGOGASTRODUODENOSCOPY N/A 10/19/2021   Procedure: ESOPHAGOGASTRODUODENOSCOPY (EGD);  Surgeon: Jinny Carmine, MD;  Location: [facility] ENDOSCOPY;  Service: Endoscopy;  Laterality: N/A;   ESOPHAGOGASTRODUODENOSCOPY (EGD) WITH PROPOFOL  N/A 11/30/2018   Procedure: ESOPHAGOGASTRODUODENOSCOPY (EGD) WITH PROPOFOL ;  Surgeon: Therisa Bi, MD;  Location: [facility] ENDOSCOPY;  Service: Gastroenterology;  Laterality: N/A;   ESOPHAGOGASTRODUODENOSCOPY (EGD) WITH PROPOFOL  N/A  12/01/2018   Procedure: ESOPHAGOGASTRODUODENOSCOPY (EGD) WITH PROPOFOL ;  Surgeon: Jinny Carmine, MD;  Location: [facility] ENDOSCOPY;  Service: Endoscopy;  Laterality: N/A;   HOLMIUM LASER APPLICATION Bilateral 05/20/2015   Procedure: HOLMIUM LASER APPLICATION;  Surgeon: Ricardo Likens, MD;  Location: [facility];  Service: Urology;  Laterality: Bilateral;   IMPLIMENTATION OF A HYPOGLOSSAL NERVE STIMULATOR  12/08/2017   OSA    IR GASTROSTOMY TUBE MOD SED  01/18/2024   IR RADIOLOGIST EVAL & MGMT  02/13/2024   LEFT HEART CATHETERIZATION WITH CORONARY ANGIOGRAM N/A 09/06/2013   Procedure: LEFT HEART CATHETERIZATION WITH CORONARY ANGIOGRAM;  Surgeon: Victory LELON Claudene DOUGLAS, MD;  Location: [facility] CATH LAB;  Service: Cardiovascular;  Laterality: N/A;   LEFT KNEE CAP  SURGERY ABOUT 40 YRS AGO  ? 1970     SPINE SURGERY  Desk in neck   STONE EXTRACTION WITH BASKET Left 03/19/2014   Procedure: STONE EXTRACTION WITH BASKET;  Surgeon: Mickey Ricardo KATHEE Likens, MD;  Location: [facility];  Service: Urology;  Laterality: Left;   TRANSURETHRAL RESECTION OF PROSTATE N/A 08/25/2021   Procedure: TRANSURETHRAL RESECTION OF THE PROSTATE (TURP);  Surgeon: Likens Ricardo, MD;  Location: [facility];  Service: Urology;  Laterality: N/A;  1 HR   Patient Active Problem List   Diagnosis Date Noted   Presence of heart assist device (HCC) 04/16/2024   Interstitial pulmonary disease, unspecified (HCC) 04/16/2024   Abnormal PET scan of lung 03/29/2024   Gastrointestinal tube present (HCC) 02/18/2024   Granulation tissue of site of gastrostomy 02/06/2024   Protein-calorie malnutrition, severe 01/23/2024   Multifocal pneumonia 01/22/2024   Chronic pulmonary aspiration 12/29/2023   Silent aspiration 12/29/2023   Moderate protein-calorie malnutrition (weight for age 16-74% of standard) 12/29/2023   Bilateral leg weakness 12/29/2023   Chronic constipation 12/29/2023   Somnolence 12/29/2023   Frequent falls 12/29/2023   Feeding difficulties, unspecified  12/20/2023   Shortness of breath 12/20/2023   Abnormal CT of the chest 10/10/2023   Carotid stenosis 05/11/2023   Spinal stenosis in cervical region 03/13/2023   Osteoarthritis of spine with radiculopathy, cervical region 07/14/2022   Cervicogenic headache 11/30/2021   Chronic neck pain 11/30/2021   Weight loss, non-intentional    Dysphagia    Polyp of colon    Prostatic hyperplasia 08/25/2021   Chronic insomnia 03/19/2020   Peripheral neuropathy 03/19/2020   Iron deficiency anemia 01/06/2020   Allergic dermatitis 07/08/2019   Osteoarthritis of left knee 02/26/2019   History of colonic polyps    History of duodenal ulcer    Chronic kidney disease, stage 3b (HCC)    Bilateral chronic knee pain 11/09/2018   Epistaxis 10/02/2018   Subjective tinnitus, bilateral 10/02/2018   Chronic pain of right thumb 02/22/2018   Acute bilateral thoracic back pain 01/23/2018   Osteoporosis without current pathological fracture 02/10/2017   Hypothyroidism 11/02/2016   Thoracic compression fracture (HCC) 09/06/2016   PVC's (premature ventricular contractions) 07/07/2016   Hypoalbuminemia 06/28/2016   IgA nephropathy 06/07/2016   Acute buttock  pain 05/23/2016   MDD (major depressive disorder), recurrent episode, moderate (HCC) 11/20/2015   OSA (obstructive sleep apnea) 02/05/2014   Orthostatic hypotension 02/07/2013   Scotoma involving central area 01/29/2013   Optic neuropathy 01/28/2013   HEMATURIA UNSPECIFIED 01/21/2010   HLD (hyperlipidemia) 07/05/2006   Generalized anxiety disorder 07/05/2006   CAD (coronary artery disease) 07/05/2006   GERD 07/05/2006    ONSET DATE: 03/2023 following ACDF; Date of referral 12/28/2023: date of this referral 02/01/2024  REFERRING DIAG:  T17.908D (ICD-10-CM) - Chronic pulmonary aspiration, subsequent encounter  T17.900D (ICD-10-CM) - Silent aspiration, subsequent encounter  E44.0 (ICD-10-CM) - Moderate protein-calorie malnutrition  R13.10 (ICD-10-CM) -  Dysphagia, unspecified type    THERAPY DIAG:  Dysphagia, oropharyngeal phase  Rationale for Evaluation and Treatment Rehabilitation  SUBJECTIVE:   PERTINENT HISTORY and DIAGNOSTIC FINDINGS:  Pt is a 74 yo male w/ ACDF surgery 03/2023.  PMH: GERD(Not on a PPI per chart), Actinic keratosis (02/04/2021), Anemia, Anxiety, Asymptomatic LV dysfunction, BCC (basal cell carcinoma of skin) (08/05/2020), Berger's disease, CAD (coronary artery disease) (06/2005), resting tremor, Depression, Dilated aortic root (HCC), Hematuria, Hyperlipidemia, Hypothyroidism, Insomnia, Left ureteral stone, Orthostatic hypotension, OSA (obstructive sleep apnea), PVC's (premature ventricular contractions) (07/07/2016), Renal disorder, Rosacea, and Urticaria.  Pt is followed by Cardiology for Coronary Artery Stenosis currently; also followed for Migraines.     EGD in 2023 revealed Gastritis.      CT of Chest 10/2023: 1. Interim development of patchy consolidation and ground-glass   disease most evident at the right middle lobe and right lower lobes   with additional areas of mild ground-glass disease and scattered   clustered nodularity, findings are suspicious for multifocal   infection/pneumonia. Imaging follow-up to resolution is recommended.   2. Emphysema.   3. Aortic atherosclerosis.            Dysphagia - reported by pt as Chronic, ever since neck surgery in December 2024. Pt was seen in the emergency room s/p Fall, notes detailing that he slipped on soap in the shower, fell backwards on right side hit head and neck against shower seat, possible brief loss of consciousness, per chart.  ACDF 03/2023 per chart.  Since the neck surgery in 03/2023, he has noted food, liquids and pill getting stuck on the right side of my throat more.  I used to cough a lot more but not as much now.  I cough up what gets stuck..  He has lost significant weight per chart notes due to decreased eating.  Pt endorsed using his home O2 when he  feels SOB.   MBSS 12/26/2023 Patient appears to present w/ Chronic, moderate-severe Pharyngeal phase dysphagia per this assessment today. It appeared there was some impact from changes in the Cervical Esophagus s/p ACDF surgery(03/2023 per chart notes) including structural/tissue changes in/aournd the CP segment and cervical esophagus. Pt with intermittent silent aspiration of thin liquids.   Recommend continue current diet of easy to eat foods at home; thin liquids via cup- at this time. Oral care Before any oral intake. Swallow Strategies Recommended: No straws, moistened foods, SMALL bites/sips, Head Turn RIGHT w/ slight chin tuck, multiple swallows to aid clearing, AND strong Cough/throat clear b/t bites/sips and at end of meal.  D/t the Chronicity of pt's Pharyngeal phase Dysphagia w/ weight loss and pulmonary impact per recent Chest CT, pt and Family may need to discuss alternative means of nutrition/hydration as support w/ his Medical Team/PCP, while seeking swallow therapy.  Received ENT report dated 01/12/2024  Laryngoscopy revealed  Moderate vocal fold bowing bilaterally and moderate glottic gamp with phonation Impression/plan: Aspiration on MBSS and significant weight loss since ACDF last year. TVF shows moderate sized glottic gap on laryngoscoy but mobile vocal folds bilaterally. Pooling in pyriform on MBSS but no significant pooling of secretions on laryngoscopy. I feel that between speech therpay and possibly bilateral injection laryngoplasty, hopefully his swallowing will significantly improve. Will make referral to Dr Brien at [facility].   Pt and his wife report appt with Dr Brien in January 2026.  10/10/205 PEG placed  01/22/2024 thru 01/24/2024 Hospitalized d/t fall and confusion CT of the chest on arrival showed multifocal pneumonia and possible free air. When reviewed further with interventional radiology team it was determined this was an appropriate amount of free air  secondary to recent PEG tube placement. Patient was initiated on antibiotic therapy for pneumonia and improved significantly.   02/07/2024 CT Chest 1. Evolving multifocal inflammatory infiltrates, progressive within the right upper lobe and improved within the lung bases bilaterally. Recommend appropriate clinical management and imaging follow-up per clinical course. 2. Mild emphysema. Given emphysema as an independent lung cancer risk factor, consider evaluation for a low-dose CT lung cancer screening program if the patient is 74 years old and otherwise eligible.  04/18/2024 Modified Barium Swallow Study Pt currently presents mild oropharyngeal dysphagia with no aspiration observed during today's study. Pt's oral phase is c/b decreased lingual manipulation of boluses resulting in oral residue prompting second swallow.  During this second swallow, pt with reduced epiglottic deflection vs initial swallow of bolus. Penetration observed during this second swallow intermittently but didn't collect and remained a trace undercoating on epiglottis. Pt with significantly improved pharyngeal phase with more timely swallow initiation observed at the level of vallecula, increased base of tongue strength with only trace residue, overall improve pharyngeal strength with trace residue for thin liquids, nectar thick liquids and minimal collection of residue when consuming puree and graham cracker, improved epiglottic deflection (complete during initial swallows of each bolus), improved passage of all boluses thru UES (including whole barium tablet). Based on this study, pt appears appropriate to return to PO intake with strict aspiration precautions. Will review study, results and recommendations with pt at next outpatient ST session. Recommend pt continue to have appt with Dr Brien at [facility] on January 26th.    PAIN:  Are you having pain? No  FALLS: Has patient fallen in last 6 months?  See PT  evaluation for details  LIVING ENVIRONMENT: Lives with: lives with their spouse Lives in: House/apartment  PLOF:  Level of assistance: Independent with ADLs, Independent with IADLs Employment: Retired   PATIENT GOALS  to improve swallow function, nutrition and hydration, decrease aspiration risks  SUBJECTIVE STATEMENT: Pt pleasant, arrived by himself, states she decided not to come with me Pt accompanied by: self  OBJECTIVE:   TODAY'S TREATMENT:  Skilled treatment session focused on pt's dysphagia goals. SLP facilitated the session by providing the following interventions:     Pt states that his wife decided not to come with him this morning. He brought in food journal with inconsistent documentation (leaving off cartons and some PO intake). Pt unable to answer questions regarding food textures consumed, any potential s/s of aspiration/dysphagia during consumption. Given pt's inconsistent report of food items/textures, use of safe swallow strategies, this writer provided skilled observation of pt consuming graham crackers. Of note, pt arrived to session consuming a breath mint with consistent wet voice, throat clearing and  cough following consecutive swallows of salvia d/t increased production with breath mint. Education provided on observed increased s/s of aspiration with consumption of breath mint given inefficient consecutive swallows per recent Modified Barium Swallow Study. Pt voiced understanding. When consuming the graham cracker, pt was independent with bolus management and single swallow. He reported it feels like the first swallow gets a lot of it to go down but then I need to use another swallow but it doesn't feel like it is going down. Education provided on continued increased pharyngeal residue present on results of Modified Barium. Recommend pt try a smaller bite (half size bite). Pt reported improvement in pharyngeal sensation. He benefited from Min A to use strategy of  half size bite.   Pharyngeal strengthening Pt was independent with pharyngeal strengthening (chin up, masako) 3 sets of 8 reps.   Respiratory Muscle Strengthening Pt independently completed 3 sets of 8 repetitions with EMT set slightly past 55 cmH2O and 3 sets of 6 repetitions of IMST set to the same.    PATIENT EDUCATION: Education details: as above Person educated: Patient and Spouse Education method: Chief Technology Officer Education comprehension: needs further education  HOME EXERCISE PROGRAM:     See above   GOALS: Goals reviewed with patient? Yes   SHORT TERM GOALS: Target date: 10 sessions            Updated 02/28/2024            Updated 02/09/2024            Update 03/26/2024 With Mod I, pt will perform compensatory swallow strategies (chin tuck, multiple swallows, head turn) to eliminate s/s of aspiration when consuming least restrictive diet. Baseline: Goal status: MET: no overt s/sx with water  consumption this date   2.  With Min A, pt will complete (supraglottic, swallow, Mednelson maneuver, effortful swallow) to improve oral motor weakness, tongue base retraction, hyolarnygeal excursion, airway protection and/or clearance of the bolus through the pharynx. Baseline:  Goal status: IN PROGRESS: progress made; moderate A: Progress made, fluctuating between supervision and independent cues   3. Pt will demonstrate overt s/s of aspiration when consuming ice chips.              Baseline: Goal status: IN PROGRESSING: no overt s/s of aspiration observed during today's session: Intermittent overt s/s observed     LONG TERM GOALS: Target date: 06/18/2024             Updated 02/28/2024             Updated: 02/09/2024             Updated: 03/26/2024    With Mod I, pt will demonstrate the ability to adequately self-monitor swallowing skills and perform appropriate compensatory techniques to reduce s/s of aspiration and to safely consume least restrictive diet.      Baseline:  Goal status: IN PROGRESS: continues to be appropriate goal   2.  Pt will participate in repeat instrumental swallow evaluation to assess least restrictive diet.  Baseline:  Goal status: IN PROGRESS: continues to be appropriate goal: Modified Barium scheduled completed 11/21; would benefit from repeat MBSS  ASSESSMENT:  CLINICAL IMPRESSION: Patient is a 74 y.o. male who was seen today for a clinical swallow treatment d/t severe pharyngeal phase dysphagia following ACDF (03/2023).    See the above treatment note for details.   OBJECTIVE IMPAIRMENTS include dysphagia. These impairments are limiting patient from safety when swallowing. Factors affecting potential to achieve  goals and functional outcome are severity of impairments and malnutrition. Patient will benefit from skilled SLP services to address above impairments and improve overall function.  REHAB POTENTIAL: Good  PLAN: SLP FREQUENCY: 1-2x/week  SLP DURATION: 12 weeks  PLANNED INTERVENTIONS: Aspiration precaution training, Pharyngeal strengthening exercises, Diet toleration management , Oral motor exercises, SLP instruction and feedback, Compensatory strategies, and Patient/family education   Jaceyon Strole B. Rubbie, M.S., CCC-SLP, CBIS Speech-Language Pathologist Certified Brain Injury Specialist [facility]  [facility] [phone] Ascom [phone] Fax [phone]               "

## 2024-05-09 NOTE — Therapy (Signed)
 " OUTPATIENT PHYSICAL THERAPY TREATMENT  Patient Name: Shawn Lam MRN: [id number] DOB:1950/05/05, 74 y.o., male Today's Date: 05/09/2024  END OF SESSION:  PT End of Session - 05/09/24 0837     Visit Number 28    Number of Visits 33    Date for Recertification  05/28/24    Progress Note Due on Visit 30    PT Start Time 0840    PT Stop Time 0920    PT Time Calculation (min) 40 min    Equipment Utilized During Treatment Gait belt    Activity Tolerance Patient tolerated treatment well    Behavior During Therapy [facility] for tasks assessed/performed         Past Medical History:  Diagnosis Date   Actinic keratosis 02/04/2021   right lateral neck   Anemia    Anxiety    Asymptomatic LV dysfunction    50-55% by echo 06/2016   BCC (basal cell carcinoma of skin) 08/05/2020   left neck infra auricular - nodular pattern - treated with [facility] 09/17/2020   Berger's disease    Blood transfusion without reported diagnosis 04/11/2018   CAD (coronary artery disease) 06/2005   PCI of LAD and RCA   Cancer (HCC)    Cataract 04/11/2016   04/11/2016   Depression    Dilated aortic root    4cm at sinus of Valsalva   Hematuria    Hyperlipidemia    Hypothyroidism    Insomnia    Left ureteral stone    Orthostatic hypotension    negative tilt table   OSA (obstructive sleep apnea)    moderate with AHI 17/hr, uses CPAP nightly(01/15/19 - has Inspire implant)   Osteoporosis 04/11/2016   PVC's (premature ventricular contractions) 07/07/2016   Renal disorder    PT IS SEEING DR. BETSEY- NEPHROLOGIST   Rosacea    Sleep apnea 04/11/2016   04/11/2016   Ulcer 04/11/2018   Urticaria    Past Surgical History:  Procedure Laterality Date   ANTERIOR CERVICAL DECOMP/DISCECTOMY FUSION N/A 03/13/2023   Procedure: CERVICAL FOUR-FIVE, CERVICAL FIVE-SIX ANTERIOR CERVICAL DISCECTOMY/DECOMPRESSION FUSION;  Surgeon: Onetha Kuba, MD;  Location: [facility] OR;  Service: Neurosurgery;  Laterality: N/A;  3C   CARDIAC  CATHETERIZATION  09/06/2013   [facility]   CARDIAC CATHETERIZATION     COLONOSCOPY WITH PROPOFOL  N/A 01/21/2019   Procedure: COLONOSCOPY WITH PROPOFOL ;  Surgeon: Jinny Carmine, MD;  Location: [facility];  Service: Endoscopy;  Laterality: N/A;  sleep apnea   COLONOSCOPY WITH PROPOFOL  N/A 10/19/2021   Procedure: COLONOSCOPY WITH PROPOFOL ;  Surgeon: Jinny Carmine, MD;  Location: [facility] ENDOSCOPY;  Service: Endoscopy;  Laterality: N/A;   CORONARY ANGIOPLASTY  2004   STENT PLACEMENT   CYSTOSCOPY WITH RETROGRADE PYELOGRAM, URETEROSCOPY AND STENT PLACEMENT Left 03/19/2014   Procedure: CYSTOSCOPY WITH RETROGRADE PYELOGRAM, URETEROSCOPY AND STENT PLACEMENT;  Surgeon: Mickey Ricardo KATHEE Alvaro, MD;  Location: [facility] ORS;  Service: Urology;  Laterality: Left;   CYSTOSCOPY WITH RETROGRADE PYELOGRAM, URETEROSCOPY AND STENT PLACEMENT Bilateral 05/20/2015   Procedure:  CYSTOSCOPY WITH BILATERAL RETROGRADE PYELOGRAM,RIGHT  DIAGNOSTIC URETEROSCOPY ,LEFT URETEROSCOPY WITH HOLMIUM LASER  AND BILATERAL  STENT PLACEMENT ;  Surgeon: Ricardo Alvaro, MD;  Location: [facility] ORS;  Service: Urology;  Laterality: Bilateral;   DRUG INDUCED ENDOSCOPY N/A 10/20/2017   Procedure: DRUG INDUCED SLEEP ENDOSCOPY;  Surgeon: Carlie Clark, MD;  Location: [facility];  Service: ENT;  Laterality: N/A;   EP IMPLANTABLE DEVICE N/A 05/13/2016   Procedure: Loop Recorder Insertion;  Surgeon: Elspeth JAYSON Sage, MD;  Location: [facility] INVASIVE CV LAB;  Service: Cardiovascular;  Laterality: N/A;   ESOPHAGOGASTRODUODENOSCOPY N/A 10/19/2021   Procedure: ESOPHAGOGASTRODUODENOSCOPY (EGD);  Surgeon: Jinny Carmine, MD;  Location: [facility] ENDOSCOPY;  Service: Endoscopy;  Laterality: N/A;   ESOPHAGOGASTRODUODENOSCOPY (EGD) WITH PROPOFOL  N/A 11/30/2018   Procedure: ESOPHAGOGASTRODUODENOSCOPY (EGD) WITH PROPOFOL ;  Surgeon: Therisa Bi, MD;  Location: [facility] ENDOSCOPY;  Service: Gastroenterology;  Laterality: N/A;   ESOPHAGOGASTRODUODENOSCOPY (EGD) WITH PROPOFOL  N/A  12/01/2018   Procedure: ESOPHAGOGASTRODUODENOSCOPY (EGD) WITH PROPOFOL ;  Surgeon: Jinny Carmine, MD;  Location: [facility] ENDOSCOPY;  Service: Endoscopy;  Laterality: N/A;   HOLMIUM LASER APPLICATION Bilateral 05/20/2015   Procedure: HOLMIUM LASER APPLICATION;  Surgeon: Ricardo Likens, MD;  Location: [facility] ORS;  Service: Urology;  Laterality: Bilateral;   IMPLIMENTATION OF A HYPOGLOSSAL NERVE STIMULATOR  12/08/2017   OSA    IR GASTROSTOMY TUBE MOD SED  01/18/2024   IR RADIOLOGIST EVAL & MGMT  02/13/2024   LEFT HEART CATHETERIZATION WITH CORONARY ANGIOGRAM N/A 09/06/2013   Procedure: LEFT HEART CATHETERIZATION WITH CORONARY ANGIOGRAM;  Surgeon: Victory LELON Claudene DOUGLAS, MD;  Location: [facility] CATH LAB;  Service: Cardiovascular;  Laterality: N/A;   LEFT KNEE CAP  SURGERY ABOUT 40 YRS AGO  ? 1970     SPINE SURGERY  Desk in neck   STONE EXTRACTION WITH BASKET Left 03/19/2014   Procedure: STONE EXTRACTION WITH BASKET;  Surgeon: Mickey Ricardo KATHEE Likens, MD;  Location: [facility] ORS;  Service: Urology;  Laterality: Left;   TRANSURETHRAL RESECTION OF PROSTATE N/A 08/25/2021   Procedure: TRANSURETHRAL RESECTION OF THE PROSTATE (TURP);  Surgeon: Likens Ricardo, MD;  Location: [facility]S;  Service: Urology;  Laterality: N/A;  1 HR   Patient Active Problem List   Diagnosis Date Noted   Presence of heart assist device (HCC) 04/16/2024   Interstitial pulmonary disease, unspecified (HCC) 04/16/2024   Abnormal PET scan of lung 03/29/2024   Gastrointestinal tube present (HCC) 02/18/2024   Granulation tissue of site of gastrostomy 02/06/2024   Protein-calorie malnutrition, severe 01/23/2024   Multifocal pneumonia 01/22/2024   Chronic pulmonary aspiration 12/29/2023   Silent aspiration 12/29/2023   Moderate protein-calorie malnutrition (weight for age 43-74% of standard) 12/29/2023   Bilateral leg weakness 12/29/2023   Chronic constipation 12/29/2023   Somnolence 12/29/2023   Frequent falls 12/29/2023   Feeding difficulties, unspecified  12/20/2023   Shortness of breath 12/20/2023   Abnormal CT of the chest 10/10/2023   Carotid stenosis 05/11/2023   Spinal stenosis in cervical region 03/13/2023   Osteoarthritis of spine with radiculopathy, cervical region 07/14/2022   Cervicogenic headache 11/30/2021   Chronic neck pain 11/30/2021   Weight loss, non-intentional    Dysphagia    Polyp of colon    Prostatic hyperplasia 08/25/2021   Chronic insomnia 03/19/2020   Peripheral neuropathy 03/19/2020   Iron deficiency anemia 01/06/2020   Allergic dermatitis 07/08/2019   Osteoarthritis of left knee 02/26/2019   History of colonic polyps    History of duodenal ulcer    Chronic kidney disease, stage 3b (HCC)    Bilateral chronic knee pain 11/09/2018   Epistaxis 10/02/2018   Subjective tinnitus, bilateral 10/02/2018   Chronic pain of right thumb 02/22/2018   Acute bilateral thoracic back pain 01/23/2018   Osteoporosis without current pathological fracture 02/10/2017   Hypothyroidism 11/02/2016   Thoracic compression fracture (HCC) 09/06/2016   PVC's (premature ventricular contractions) 07/07/2016   Hypoalbuminemia 06/28/2016   IgA nephropathy 06/07/2016   Acute buttock pain 05/23/2016  MDD (major depressive disorder), recurrent episode, moderate (HCC) 11/20/2015   OSA (obstructive sleep apnea) 02/05/2014   Orthostatic hypotension 02/07/2013   Scotoma involving central area 01/29/2013   Optic neuropathy 01/28/2013   HEMATURIA UNSPECIFIED 01/21/2010   HLD (hyperlipidemia) 07/05/2006   Generalized anxiety disorder 07/05/2006   CAD (coronary artery disease) 07/05/2006   GERD 07/05/2006    PCP: Dr. Greig Ring    REFERRING PROVIDER: Dr. Arley Helling   REFERRING DIAG: M54.6 (ICD-10-CM) - Acute bilateral thoracic back pain  THERAPY DIAG:  Abnormality of gait  Muscle weakness (generalized)  Pain in thoracic spine  Cervicalgia  Balance disorder  Rationale for Evaluation and Treatment: Rehabilitation  ONSET DATE:  May 2025    SUBJECTIVE:                                                                                                                                                                                                         SUBJECTIVE STATEMENT   Pt reports that he is doing well. Reports that he did not use treadmill this AM and that he was able to consume 1 bolus from PEG as well as Eggs and 1/2 carton of muscle milk. States that he had some neck pain last night, but has reduced to 4/10 this AM.     PERTINENT HISTORY:   73yoM referred to physical therapy for ongoing neck pain following cervical fusion in May 2020 and newly developed thoracic pain from T12 compression fracture that occurred sometime in Spring of 2025 (associated radicular pain pattern). Pt has pain localized to upper traps and suboccipitals. Pt attempted DC of prednisone  and gabapentin  two weeks prior to evaluation here, severe decline in function due to pain, barely able to walk for two weeks. He describes feeling increased fatigue and weakness to the point where he struggled to walk. Pt also reported repeated falls in the setting of orthostatic hypotension with him experiencing syncope when he stands up from a seated position. He describes feeling a burning sensation that is also localized to his scapulae and that it occurs when he is doing UE activities like washing his car that is likely the radicular pain from thoracic vertebrae compression fracture.  PAIN: Are you having pain? 03/05/2024: 6/10 today in the neck  PRECAUTIONS: None Pt has thoracic compression fractures and is s/p cervical spine surgery   WEIGHT BEARING RESTRICTIONS: No  FALLS:   Has patient fallen in last 6 months? Yes. Number of falls 6, pt reports that falls are happening in the context of syncope when standing up from  a seated position (Orthostatic Hypotension) , where he blacks out and falls. He is following up with his PCP, Dr. Karlene who is working  with him on stabilizing his blood pressure and she has recently taken him off Gabapentin  to see if this helps. Pt reports that he does note an improvement in his symptoms with less light headedness since going off medication.   LIVING ENVIRONMENT: Lives with: lives with their spouse Lives in: House/apartment Stairs: No Has following equipment at home: Single point cane and Environmental Consultant - 4 wheeled  OCCUPATION: Retired     PLOF: Independent  PATIENT GOALS: Improve balance, decrease falls.       OBJECTIVE:   Note: Objective measures were completed at evaluation unless otherwise noted. Reassessment Measures 02/09/24: -eyes closed balance, normal stance: <5sec with retropulsion each time, ModA falls recovery  -narrow stance eyes open balance: <10sec with LOB, ModA falls recovery  -Long distance AMB assessment c SPC: 116ft with 2 DOE standing breaks ~15 seconds each, 15sec; crossover gait intermittently without LOB; -standing vitals taken at end of walk: stable and comparable to prior seated vitals.  -cervical rotation ROM: 50deg Rt, 53 degree left  -shoulder flexion MMT: 5/5 Rt, 4+/5 left; shoulder ABDCT MMT: 5/5 bilat  -ankle DF assessment: can perform >10x full range while leaning against wall (adequate for gait needs)  -sensory testing about ankles, lower legs: (feels intact, but pt reports constance N/T below ankle to his toes)  Standing BP assessment: 02/28/24 -124/104mmHg 95bpm standing @ 0 -122/79mmHg 90bpm standing @ 1 110/89mmHg 92bpm standing @ 2   Cervical ROM  1/20: Flexion: 30: extension: 30. Rot R: 45, Rot L40: SB L: 20. SB R: 15   TREATMENT DATE 05/09/24:   TE Seated ER and shoulder retraction x 20 YTB  Therapy ball rollout 3-5 sec hold x 10 forward and x 10 diagonal performed bil. Pec stretch in doorway, 5 sec hold x 10  Standing serratus lift x 12  Chin tucks 2 x 12 with 3 sec hold  Side bending/UT stretch 2 x 30 sec hold bil Levator stretch 2 x 20 sec bil     Manual:  Seated STM for TrP management and improved muscle extensibility in the Upper traps, parascapular muscles, Cervical paraspinals   x 10 min total   PATIENT EDUCATION:   Education details: Pt progressing so far with cardio and balance activity.     Dangers of prolonged low BP.  Importance of matching caloric intake with activity level to maintain body weight   Person educated: Patient Education method: Explanation and Demonstration Education comprehension: verbalized understanding, returned demonstration, and verbal cues required  HOME EXERCISE PROGRAM: Access Code: [email or web address] URL: [email or web address] Date: 04/17/2024 Prepared by: Lonni Gainer  Exercises - Marching Near Counter  - 1 x daily - 3-5 x weekly - 2 sets - 10 reps - 3 sec hold - Single leg balance near counter   - 1 x daily - 3-5 x weekly - 3 sets - 30 second hold - Heel Toe Raises with Counter Support  - 1 x daily - 3-5 x weekly - 2 sets - 15 reps - Sit to Stand with Arms Crossed  - 1 x daily - 3-5 x weekly - 2 sets - 10 reps  Access Code: [email or web address] URL: [email or web address] Date: 05/03/2024 Prepared by: Massie Dollar  Exercises - Seated Bilateral Shoulder Flexion Towel Slide at Table Top  - 1 x daily - 7 x weekly - 2 sets - 10 reps -  3-5 sec hold - Seated Upper Trapezius Stretch  - 1 x daily - 7 x weekly - 3 sets - 10 reps - Seated Thoracic Lumbar Extension  - 1 x daily - 7 x weekly - 3 sets - 10 reps - Seated Thoracic Extension AROM  - 1 x daily - 7 x weekly - 3 sets - 10 reps - Seated Cervical Rotation AROM  - 1 x daily - 7 x weekly - 3 sets - 10 reps - Seated Assisted Cervical Rotation with Towel  - 1 x daily - 7 x weekly - 3 sets - 10 reps - Cervical Extension AROM with Strap  - 1 x daily - 7 x weekly - 3 sets - 10 reps  ASSESSMENT:  CLINICAL IMPRESSION:   PT treatment focused on cervical ROM and improved muscle extensibility in Cspine and upper thoracic region as  well as improved scapular activation to reduce UT tension.   Tolerated treatment well and reports decreased upper back/shoulder tightness upon completion. Elected to perform all interventions in sitting to prevent orthostatic hypotension following prolonged supine. Pt does also report that he adjusted timing of bolus feeding to allow earlier start to calorie consumption as well as increased food consumption orally.  Pt will continue to benefit from skilled physical therapy intervention to address impairments, improve QOL, and attain therapy goals.      OBJECTIVE IMPAIRMENTS: decreased balance, decreased ROM, decreased strength, hypomobility, impaired flexibility, impaired UE functional use, and pain.   ACTIVITY LIMITATIONS: carrying, lifting, standing, bathing, dressing, reach over head, and hygiene/grooming PARTICIPATION LIMITATIONS: cleaning, laundry, shopping, community activity, and yard work PERSONAL FACTORS: Age and 3+ comorbidities: CAD, Depression, are also affecting patient's functional outcome.  REHAB POTENTIAL: Good CLINICAL DECISION MAKING: Stable/uncomplicated EVALUATION COMPLEXITY: Low   GOALS: Goals reviewed with patient? No  SHORT TERM GOALS: Target date: 05/14/2024    Patient will demonstrate undestanding of home exercise plan by performing exercises correctly with evidence of good carry over with min to no verbal or tactile cues .   Baseline: NT 12/25/23: Performing exercises independently  Goal status: ACHIEVED   2.  Patient will demonstrate understanding of how to modify exercises to decrease thoracic and cervical pain exacerbation for improved function and to decrease incidence of pain flare ups.  Baseline: slightly improved. Will continue to address.  Goal status: In progress   LONG TERM GOALS: Target date: 05/28/2024  Patient will show a statistically significant improvement in his neck function as evidence by >=7.5 pt decrease in her neck disability index score to  better be able to move neck to improve visual field while driving to avoid accidents. Vernel et al, 2009) Baseline: 33/50 (66%); 02/09/24: 66% 03/01/2024: 50% 04/12/24: 28 (56%) 04/30/24: 31 (61%)  Goal status: NOT MET     2.  Patient will increase cervical mobility by  >=5 degrees AROM for all planes of motion (flexion, extension, side bending, and rotation) for improved cervical function and evidence of pain reduction. Baseline: Cervical AROM Flex 30, Ext 30, Rot R/L 40/40, SB R/L 35/35; 02/09/24: Rt rotation 50 degrees, Left rotation: 53 degress  03/01/2024: Flex:  30*   Ext: 20*      Rot L: 32*     Rot R: 30*      SB L: 20*       SB R: 15* 1/20: Flexion: 30: extension: 30. Rot R: 45, Rot L40: SB L: 20. SB R: 15  Goal status: in progress   3.  Patient  will improve shoulder and periscapular strength by 1/3 grade MMT (4- to 4) for improved cervical stability and UE function to carry out upper extremity activities like washing his car and gardening without being limited by pain and discomfort.  Baseline: Shoulder Flex R/L 4/4, Shoulder Abd R/L 4/4; 10/31: shoulder flexion Rt: 5/5, left 4+/5; shoulder ABDCT 5/5 bilat  03/01/2024: Shld Flex R: 5 Shld Flex L: 4+ , Shld abd R: 4 +  Shld Abd L: 4 Goal status: MET    4.  Patient will reduce neck and thoracic pain to <=4/10 NRPS while performing UE activities like washing his car and doing yard work as evidence of improved cervical and thoracic function.  Baseline: 10/10 cervical paraspinal and rhomboid pain with UE activity; up to a 9/10 intermittently with activity   03/01/2024: 8/10 NPS UE activity 04/12/24: 8/10 NPS with UE activity 04/30/24: 8/10 NPS with UE activity (washing Car; cleaning house) Goal status: NOT MET     PLAN:  PT FREQUENCY: 1-2x/week PT DURATION: 4 weeks PLANNED INTERVENTIONS: 97164- PT Re-evaluation, 97750- Physical Performance Testing, 97110-Therapeutic exercises, 97530- Therapeutic activity, 97112- Neuromuscular  re-education, 97535- Self Care, [id number]- Manual therapy, [id number]- Gait training, [phone]- Canalith repositioning, [id number]- Aquatic Therapy, H9716- Electrical stimulation (unattended), [id number]- Electrical stimulation (manual), [id number]- Traction (mechanical), 20560 (1-2 muscles), 20561 (3+ muscles)- Dry Needling, Patient/Family education, Balance training, Taping, Joint mobilization, Joint manipulation, Spinal manipulation, Spinal mobilization, Vestibular training, DME instructions, Cryotherapy, and Moist heat  PLAN FOR NEXT SESSION:    -continue to address cervical ROM/pain with therex and Manual therapy.  - TDN to UT /levator/ cervical paraspinals.      Massie FORBES Dollar PT ,DPT Physical Therapist- [facility]   05/09/24, 8:38 AM   "

## 2024-05-10 ENCOUNTER — Ambulatory Visit
Admission: RE | Admit: 2024-05-10 | Discharge: 2024-05-10 | Disposition: A | Source: Ambulatory Visit | Attending: Family Medicine | Admitting: Family Medicine

## 2024-05-10 ENCOUNTER — Other Ambulatory Visit: Payer: Self-pay | Admitting: Family Medicine

## 2024-05-10 DIAGNOSIS — R0602 Shortness of breath: Secondary | ICD-10-CM | POA: Diagnosis present

## 2024-05-10 DIAGNOSIS — R9389 Abnormal findings on diagnostic imaging of other specified body structures: Secondary | ICD-10-CM | POA: Insufficient documentation

## 2024-05-10 DIAGNOSIS — R21 Rash and other nonspecific skin eruption: Secondary | ICD-10-CM

## 2024-05-10 NOTE — Telephone Encounter (Signed)
 Last office visit 04/16/24 for CPE.  Last refilled 12/29/2023 for 454 g with no refills. Next appt: CPE 04/18/2025

## 2024-05-13 ENCOUNTER — Encounter: Payer: Self-pay | Admitting: Family Medicine

## 2024-05-14 ENCOUNTER — Ambulatory Visit: Admitting: Speech Pathology

## 2024-05-14 ENCOUNTER — Emergency Department

## 2024-05-14 ENCOUNTER — Ambulatory Visit

## 2024-05-14 ENCOUNTER — Other Ambulatory Visit: Payer: Self-pay

## 2024-05-14 ENCOUNTER — Emergency Department: Admission: EM | Admit: 2024-05-14 | Discharge: 2024-05-14 | Disposition: A | Discharge status: Home / Self Care

## 2024-05-14 ENCOUNTER — Ambulatory Visit: Admitting: Physical Therapy

## 2024-05-14 ENCOUNTER — Ambulatory Visit: Payer: Self-pay

## 2024-05-14 ENCOUNTER — Encounter: Admitting: Speech Pathology

## 2024-05-14 DIAGNOSIS — K9429 Other complications of gastrostomy: Secondary | ICD-10-CM

## 2024-05-14 DIAGNOSIS — K9423 Gastrostomy malfunction: Secondary | ICD-10-CM | POA: Insufficient documentation

## 2024-05-14 MED ORDER — DIATRIZOATE MEGLUMINE & SODIUM 66-10 % PO SOLN
30.0000 mL | Freq: Once | ORAL | Status: AC
  Administered 2024-05-14: 30 mL

## 2024-05-14 NOTE — Telephone Encounter (Signed)
 Shawn Lam with [facility] transferred pt call to [facility] and I spoke with pt; pt is concerned that feeding tube is either blocked or out of place. Nothing will go down feeding tube; pt has abd pain level of 8, pt cannot get [facility] and I advised pt to go to ED now. Pt said will have someone take him to [facility] now. Pt declined EMS. Sending note to Dr Avelina and [place] pool.

## 2024-05-14 NOTE — ED Provider Notes (Signed)
 74 year old male who was signed out to me due to concern of G-tube to malfunction, was able to be adequately flushed here, on Gastrografin  study initially appears appropriately placed I discussed with the patient, unfortunately not draining the way that it has been before in the past, after shared decision making we decided to change to G-tube, was successfully changed and confirmed placement on Gastrografin  study, however unfortunately continues to have difficulty with draining adequately.  I spoke with Dr. Desiderio from general surgery regarding the issue, he had no further recommendations at this time.  Unfortunately not clear why it is not draining the way that it normally does, but I believe reasonable for discharge home at this time, I discussed return precautions while the patient discharged.  .Gastrostomy tube replacement  Date/Time: 05/14/2024 10:40 PM  Performed by: Fernand Rossie HERO, MD Authorized by: Fernand Rossie HERO, MD  Consent: Verbal consent obtained Risks and benefits: risks, benefits and alternatives were discussed Consent given by: patient Patient identity confirmed: verbally with patient Patient tolerance: patient tolerated the procedure well with no immediate complications       Fernand Rossie HERO, MD 05/14/24 2240

## 2024-05-15 ENCOUNTER — Encounter: Payer: Self-pay | Admitting: Family Medicine

## 2024-05-16 ENCOUNTER — Ambulatory Visit

## 2024-05-16 ENCOUNTER — Encounter: Payer: Self-pay | Admitting: Family Medicine

## 2024-05-16 ENCOUNTER — Ambulatory Visit: Payer: Self-pay | Admitting: Family Medicine

## 2024-05-16 ENCOUNTER — Encounter: Admitting: Speech Pathology

## 2024-05-16 ENCOUNTER — Ambulatory Visit: Admitting: Speech Pathology

## 2024-05-16 ENCOUNTER — Ambulatory Visit: Admitting: Physical Therapy

## 2024-05-16 ENCOUNTER — Ambulatory Visit: Admitting: Family Medicine

## 2024-05-16 VITALS — BP 120/76 | HR 93 | Temp 97.9°F | Ht 68.75 in | Wt 122.0 lb

## 2024-05-16 DIAGNOSIS — T17900D Unspecified foreign body in respiratory tract, part unspecified causing asphyxiation, subsequent encounter: Secondary | ICD-10-CM

## 2024-05-16 DIAGNOSIS — E440 Moderate protein-calorie malnutrition: Secondary | ICD-10-CM

## 2024-05-16 DIAGNOSIS — H1131 Conjunctival hemorrhage, right eye: Secondary | ICD-10-CM | POA: Insufficient documentation

## 2024-05-16 DIAGNOSIS — Z931 Gastrostomy status: Secondary | ICD-10-CM

## 2024-05-16 DIAGNOSIS — Z7901 Long term (current) use of anticoagulants: Secondary | ICD-10-CM | POA: Insufficient documentation

## 2024-05-16 DIAGNOSIS — H538 Other visual disturbances: Secondary | ICD-10-CM | POA: Insufficient documentation

## 2024-05-16 LAB — COMPREHENSIVE METABOLIC PANEL WITH GFR
ALT: 28 U/L (ref 3–53)
AST: 31 U/L (ref 5–37)
Albumin: 3.8 g/dL (ref 3.5–5.2)
Alkaline Phosphatase: 56 U/L (ref 39–117)
BUN: 29 mg/dL — ABNORMAL HIGH (ref 6–23)
CO2: 32 meq/L (ref 19–32)
Calcium: 9.4 mg/dL (ref 8.4–10.5)
Chloride: 102 meq/L (ref 96–112)
Creatinine, Ser: 1.19 mg/dL (ref 0.40–1.50)
GFR: 60.43 mL/min
Glucose, Bld: 104 mg/dL — ABNORMAL HIGH (ref 70–99)
Potassium: 4.5 meq/L (ref 3.5–5.1)
Sodium: 138 meq/L (ref 135–145)
Total Bilirubin: 0.5 mg/dL (ref 0.2–1.2)
Total Protein: 6.3 g/dL (ref 6.0–8.3)

## 2024-05-16 LAB — CBC WITH DIFFERENTIAL/PLATELET
Basophils Absolute: 0.1 10*3/uL (ref 0.0–0.1)
Basophils Relative: 0.9 % (ref 0.0–3.0)
Eosinophils Absolute: 0.3 10*3/uL (ref 0.0–0.7)
Eosinophils Relative: 3.4 % (ref 0.0–5.0)
HCT: 35.6 % — ABNORMAL LOW (ref 39.0–52.0)
Hemoglobin: 11.8 g/dL — ABNORMAL LOW (ref 13.0–17.0)
Lymphocytes Relative: 18.1 % (ref 12.0–46.0)
Lymphs Abs: 1.4 10*3/uL (ref 0.7–4.0)
MCHC: 33.2 g/dL (ref 30.0–36.0)
MCV: 100.3 fl — ABNORMAL HIGH (ref 78.0–100.0)
Monocytes Absolute: 0.4 10*3/uL (ref 0.1–1.0)
Monocytes Relative: 5 % (ref 3.0–12.0)
Neutro Abs: 5.6 10*3/uL (ref 1.4–7.7)
Neutrophils Relative %: 72.6 % (ref 43.0–77.0)
Platelets: 235 10*3/uL (ref 150.0–400.0)
RBC: 3.55 Mil/uL — ABNORMAL LOW (ref 4.22–5.81)
RDW: 14.3 % (ref 11.5–15.5)
WBC: 7.6 10*3/uL (ref 4.0–10.5)

## 2024-05-16 NOTE — Assessment & Plan Note (Signed)
 Information provided to patient for follow-up with Dr. Jenna who originally placed the G-tube for recommendations on current G-tube.

## 2024-05-16 NOTE — Telephone Encounter (Signed)
 I spoke with pt; pt said yesterday evening pt rt eye got blurred and pt looked and eye was blood shot; no pain in eye, no H/A or dizziness; today still blood shot and slight blurred vision this morning and has black eye also. Pt said no known injury. Pt wants to see dr Avelina before seeing eye dr. Almeta scheduled appt with Dr Avelina 05/16/24 at 9 AM. Sending note to Dr Avelina and bedsole pool with [facility] & ED precautions. SABRA

## 2024-05-16 NOTE — Assessment & Plan Note (Signed)
 On Plavix  and aspirin ..  This is likely contributing to eye symptoms.

## 2024-05-16 NOTE — Assessment & Plan Note (Signed)
 Acute, severe with associated blurred vision. Will have him hold anticoagulation for 48 hours. Recommend urgent evaluation with ophthalmologist given severity of conjunctival hemorrhage and presence of bruising around eye as well. Good extraocular muscle motion. Normal neurologic exam other than decreased vision acuity in right eye.  No clear suggestion of intracranial hemorrhage or need for CT at this time.  Patient without headache.  Will evaluate with complete metabolic panel and CBC given patient malnourishment and to rule out possible new change contributing to bleeding.  Return ER precautions provided.

## 2024-05-16 NOTE — Patient Instructions (Addendum)
"   Call Dr. Laurice.. for appt to today if possible for eye evaluation.  Hold plavix  and aspirin   for 2 days.  Call Dr. Jenna  [facility] [phone]  Call to set up G tube follow up. "

## 2024-05-16 NOTE — Assessment & Plan Note (Signed)
 2 pound weight gain in the last week now that he stopped walking daily.  I have encouraged him to hold off on physical activity until continued weight gain accomplished. He has also increased p.o. intake with eggs, protein shake and pudding.  He is on higher calorie formula.

## 2024-05-16 NOTE — Assessment & Plan Note (Signed)
 Chronic, improved on recent swallowing study. Patient has appointment fortunately moved up to February 9 with ENT specialist Dr. Brien at [facility] as per recommended by local ENT.

## 2024-05-16 NOTE — Progress Notes (Signed)
 "   Patient ID: Shawn Lam, male    DOB: 1950/11/01, 74 y.o.   MRN: [id number]  This visit was conducted in person.  BP 120/76 (Patient Position: Sitting, Cuff Size: Normal)   Pulse 93   Temp 97.9 F (36.6 C) (Oral)   Ht 5' 8.75 (1.746 m)   Wt 122 lb (55.3 kg)   SpO2 100%   BMI 18.15 kg/m    CC:  Chief Complaint  Patient presents with   Acute Visit    Pt was talking to friend yesterday when all the sudden vision went blurry in right eye, looked in mirror and saw eye was full of blood, states it has darkened since last night, no known injury    Subjective:   HPI: Shawn Lam is a 74 y.o. male presenting on 05/16/2024 for Acute Visit (Pt was talking to friend yesterday when all the sudden vision went blurry in right eye, looked in mirror and saw eye was full of blood, states it has darkened since last night, no known injury)  Sudden onset blurry vision  in right eye yesterday, noted right eye full of blood No know fall or head injury.  Pt is on ASA 81 mg daily  and Plavix .. for CAD.  No headache.  No numbness or weakness.   Pt malnourished given aspiration... now cleared for po but minimal weight gain. He has stopped exercise in last week. Eating egg, pudding  and muscle milk po in addition to 6 cartons per G tube. Has gained 2 lbs per home scales in last week.  Has G tube... recently blocked.   ER visit reviewed 05/14/2024 On Gastrografin  study initially appears  G tube appropriately placed, unfortunately not draining the way that it has been before in the past,  G tube was replaced.  Surgeon Dr. Lilton consulted.. no additional recommendations.  Now flushing after ER visit but still not draining the same way. No further abdominal pain.   Wt Readings from Last 3 Encounters:  05/16/24 122 lb (55.3 kg)  05/14/24 117 lb 8.1 oz (53.3 kg)  04/16/24 117 lb 9 oz (53.3 kg)    Decreased acuity right eye from baseline.  Ophthalmologist Dr. Laurice.   Has appt  with Dr.  Brien Monday 2/9 for eval.     Relevant past medical, surgical, family and social history reviewed and updated as indicated. Interim medical history since our last visit reviewed. Allergies and medications reviewed and updated. Outpatient Medications Prior to Visit  Medication Sig Dispense Refill   aspirin  81 MG chewable tablet Place 1 tablet (81 mg total) into feeding tube daily.     busPIRone  (BUSPAR ) 10 MG tablet TAKE 2 TABLETS BY MOUTH 3 TIMES A DAY 180 tablet 5   Cholecalciferol  (VITAMIN D3) 50 MCG (2000 UT) TABS Place 1 tablet (2,000 Units total) into feeding tube daily.     clopidogrel  (PLAVIX ) 75 MG tablet Place 1 tablet (75 mg total) into feeding tube daily.     denosumab  (PROLIA ) 60 MG/ML SOSY injection Inject 60 mg into the skin every 6 (six) months. Do not send RX, order this from the office     DULoxetine  (CYMBALTA ) 60 MG capsule Take 2 capsules (120 mg total) by mouth daily.     Erenumab-aooe (AIMOVIG) 140 MG/ML SOAJ 140 mg every 28 (twenty-eight) days.     levothyroxine  (SYNTHROID ) 88 MCG tablet Place 1 tablet (88 mcg total) into feeding tube daily before breakfast.     midodrine  (PROAMATINE )  10 MG tablet Take 1 tablet (10 mg total) by mouth 3 (three) times daily as needed. For Systolic Blood Pressure (Top number) of less than 115 180 tablet 3   nitroGLYCERIN  (NITROSTAT ) 0.4 MG SL tablet Place 1 tablet (0.4 mg total) under the tongue every 5 (five) minutes as needed for chest pain. 25 tablet 3   Nutritional Supplements (FEEDING SUPPLEMENT, OSMOLITE 1.5 CAL,) LIQD Place 120 mLs into feeding tube 6 (six) times daily.     triamcinolone  cream (KENALOG ) 0.1 % APPLY TOPICALLY TO AFFECTED AREAS TWICE DAILY. DO NOT APPLY TO FACE, GROIN, OR UNDERARMS. 454 g 0   Water  For Irrigation, Sterile (FREE WATER ) SOLN Place 100 mLs into feeding tube 6 (six) times daily.     zolpidem  (AMBIEN ) 10 MG tablet TAKE 1 TABLET BY MOUTH EVERY NIGHT AT BEDTIME AS NEEDED FOR SLEEP 30 tablet 1   No  facility-administered medications prior to visit.     Per HPI unless specifically indicated in ROS section below Review of Systems  Constitutional:  Negative for fatigue and fever.  HENT:  Negative for ear pain.   Eyes:  Negative for pain.  Respiratory:  Negative for cough and shortness of breath.   Cardiovascular:  Negative for chest pain, palpitations and leg swelling.  Gastrointestinal:  Negative for abdominal pain.  Genitourinary:  Negative for dysuria.  Musculoskeletal:  Negative for arthralgias.  Neurological:  Negative for syncope, light-headedness and headaches.  Psychiatric/Behavioral:  Negative for dysphoric mood.    Objective:  BP 120/76 (Patient Position: Sitting, Cuff Size: Normal)   Pulse 93   Temp 97.9 F (36.6 C) (Oral)   Ht 5' 8.75 (1.746 m)   Wt 122 lb (55.3 kg)   SpO2 100%   BMI 18.15 kg/m   Wt Readings from Last 3 Encounters:  05/16/24 122 lb (55.3 kg)  05/14/24 117 lb 8.1 oz (53.3 kg)  04/16/24 117 lb 9 oz (53.3 kg)      Physical Exam Vitals reviewed.  Constitutional:      Appearance: He is well-developed.  HENT:     Head: Normocephalic.     Right Ear: Hearing normal.     Left Ear: Hearing normal.     Nose: Nose normal.  Eyes:     General: Gaze aligned appropriately. No visual field deficit.       Right eye: No discharge or hordeolum.        Left eye: No discharge or hordeolum.     Extraocular Movements: Extraocular movements intact.     Right eye: Normal extraocular motion and no nystagmus.     Left eye: Normal extraocular motion and no nystagmus.     Conjunctiva/sclera:     Right eye: Right conjunctiva is not injected. Hemorrhage present. No chemosis or exudate.    Left eye: Left conjunctiva is not injected. No chemosis, exudate or hemorrhage.    Comments:  Contusion around eye  Neck:     Thyroid : No thyroid  mass or thyromegaly.     Vascular: No carotid bruit.     Trachea: Trachea normal.  Cardiovascular:     Rate and Rhythm: Normal  rate and regular rhythm.     Pulses: Normal pulses.     Heart sounds: Heart sounds not distant. No murmur heard.    No friction rub. No gallop.     Comments: No peripheral edema Pulmonary:     Effort: Pulmonary effort is normal. No respiratory distress.     Breath sounds: Normal breath sounds.  Skin:    General: Skin is warm and dry.     Findings: No rash.  Psychiatric:        Speech: Speech normal.        Behavior: Behavior normal.        Thought Content: Thought content normal.       Results for orders placed or performed in visit on 04/09/24  Phosphorus   Collection Time: 04/09/24  8:00 AM  Result Value Ref Range   Phosphorus 3.8 2.3 - 4.6 mg/dL  Magnesium    Collection Time: 04/09/24  8:00 AM  Result Value Ref Range   Magnesium  2.0 1.5 - 2.5 mg/dL  Comprehensive metabolic panel with GFR   Collection Time: 04/09/24  8:00 AM  Result Value Ref Range   Sodium 137 135 - 145 mEq/L   Potassium 4.1 3.5 - 5.1 mEq/L   Chloride 100 96 - 112 mEq/L   CO2 29 19 - 32 mEq/L   Glucose, Bld 135 (H) 70 - 99 mg/dL   BUN 32 (H) 6 - 23 mg/dL   Creatinine, Ser 8.30 (H) 0.40 - 1.50 mg/dL   Total Bilirubin 0.6 0.2 - 1.2 mg/dL   Alkaline Phosphatase 67 39 - 117 U/L   AST 43 (H) 5 - 37 U/L   ALT 36 3 - 53 U/L   Total Protein 6.4 6.0 - 8.3 g/dL   Albumin 3.8 3.5 - 5.2 g/dL   GFR 60.29 (L) >39.99 mL/min   Calcium  9.6 8.4 - 10.5 mg/dL   *Note: Due to a large number of results and/or encounters for the requested time period, some results have not been displayed. A complete set of results can be found in Results Review.    Assessment and Plan  Conjunctival hemorrhage of right eye Assessment & Plan: Acute, severe with associated blurred vision. Will have him hold anticoagulation for 48 hours. Recommend urgent evaluation with ophthalmologist given severity of conjunctival hemorrhage and presence of bruising around eye as well. Good extraocular muscle motion. Normal neurologic exam other  than decreased vision acuity in right eye.  No clear suggestion of intracranial hemorrhage or need for CT at this time.  Patient without headache.  Will evaluate with complete metabolic panel and CBC given patient malnourishment and to rule out possible new change contributing to bleeding.  Return ER precautions provided.  Orders: -     CBC with Differential/Platelet -     Comprehensive metabolic panel with GFR  Blurred vision, right eye  Moderate protein-calorie malnutrition (weight for age 95-74% of standard) Assessment & Plan: 2 pound weight gain in the last week now that he stopped walking daily.  I have encouraged him to hold off on physical activity until continued weight gain accomplished. He has also increased p.o. intake with eggs, protein shake and pudding.  He is on higher calorie formula.  Orders: -     CBC with Differential/Platelet -     Comprehensive metabolic panel with GFR  Gastrointestinal tube present [facility]) Assessment & Plan: Information provided to patient for follow-up with Dr. Jenna who originally placed the G-tube for recommendations on current G-tube.   Current use of long term anticoagulation Assessment & Plan: On Plavix  and aspirin ..  This is likely contributing to eye symptoms.   Silent aspiration, subsequent encounter Assessment & Plan: Chronic, improved on recent swallowing study. Patient has appointment fortunately moved up to February 9 with ENT specialist Dr. Brien at [facility] as per recommended by local ENT.  No follow-ups on file.   Greig Ring, MD  "

## 2024-05-17 NOTE — Telephone Encounter (Signed)
 Patient is scheduled 07/23/2024 at 1pm with Dr. Jess- nothing further needed.

## 2024-05-20 ENCOUNTER — Ambulatory Visit

## 2024-05-21 ENCOUNTER — Encounter: Admitting: Speech Pathology

## 2024-05-21 ENCOUNTER — Ambulatory Visit

## 2024-05-21 ENCOUNTER — Ambulatory Visit: Admitting: Pulmonary Disease

## 2024-05-21 ENCOUNTER — Ambulatory Visit: Admitting: Speech Pathology

## 2024-05-23 ENCOUNTER — Ambulatory Visit: Admitting: Physical Therapy

## 2024-05-23 ENCOUNTER — Encounter: Admitting: Speech Pathology

## 2024-05-23 ENCOUNTER — Ambulatory Visit: Admitting: Speech Pathology

## 2024-05-23 ENCOUNTER — Ambulatory Visit

## 2024-05-28 ENCOUNTER — Ambulatory Visit

## 2024-05-28 ENCOUNTER — Encounter: Admitting: Speech Pathology

## 2024-05-28 ENCOUNTER — Ambulatory Visit: Admitting: Speech Pathology

## 2024-05-30 ENCOUNTER — Ambulatory Visit: Admitting: Physical Therapy

## 2024-05-30 ENCOUNTER — Encounter: Admitting: Speech Pathology

## 2024-05-30 ENCOUNTER — Ambulatory Visit: Admitting: Speech Pathology

## 2024-05-30 ENCOUNTER — Ambulatory Visit

## 2024-06-04 ENCOUNTER — Ambulatory Visit: Admitting: Speech Pathology

## 2024-06-04 ENCOUNTER — Ambulatory Visit

## 2024-06-04 ENCOUNTER — Encounter: Admitting: Speech Pathology

## 2024-06-06 ENCOUNTER — Ambulatory Visit

## 2024-06-06 ENCOUNTER — Encounter: Admitting: Speech Pathology

## 2024-06-06 ENCOUNTER — Ambulatory Visit: Admitting: Speech Pathology

## 2024-06-06 ENCOUNTER — Ambulatory Visit: Admitting: Physical Therapy

## 2024-06-10 ENCOUNTER — Ambulatory Visit: Admitting: Student

## 2024-06-11 ENCOUNTER — Encounter: Admitting: Speech Pathology

## 2024-06-11 ENCOUNTER — Ambulatory Visit

## 2024-06-11 ENCOUNTER — Ambulatory Visit: Admitting: Speech Pathology

## 2024-06-13 ENCOUNTER — Encounter: Payer: Self-pay | Admitting: Neurology

## 2024-06-13 ENCOUNTER — Encounter: Admitting: Speech Pathology

## 2024-06-13 ENCOUNTER — Ambulatory Visit

## 2024-06-18 ENCOUNTER — Ambulatory Visit: Admitting: Speech Pathology

## 2024-06-18 ENCOUNTER — Encounter: Admitting: Speech Pathology

## 2024-06-18 ENCOUNTER — Ambulatory Visit

## 2024-06-20 ENCOUNTER — Encounter: Admitting: Speech Pathology

## 2024-06-20 ENCOUNTER — Ambulatory Visit: Admitting: Speech Pathology

## 2024-06-20 ENCOUNTER — Ambulatory Visit

## 2024-06-20 ENCOUNTER — Ambulatory Visit: Admitting: Physical Therapy

## 2024-06-25 ENCOUNTER — Ambulatory Visit: Admitting: Dermatology

## 2024-07-02 ENCOUNTER — Ambulatory Visit: Admitting: Speech Pathology

## 2024-07-02 ENCOUNTER — Ambulatory Visit: Admitting: Physical Therapy

## 2024-07-04 ENCOUNTER — Ambulatory Visit: Admitting: Speech Pathology

## 2024-07-04 ENCOUNTER — Ambulatory Visit

## 2024-07-09 ENCOUNTER — Ambulatory Visit: Admitting: Physical Therapy

## 2024-07-09 ENCOUNTER — Ambulatory Visit: Admitting: Speech Pathology

## 2024-07-11 ENCOUNTER — Ambulatory Visit

## 2024-07-11 ENCOUNTER — Ambulatory Visit: Admitting: Speech Pathology

## 2024-07-16 ENCOUNTER — Ambulatory Visit: Admitting: Speech Pathology

## 2024-07-16 ENCOUNTER — Ambulatory Visit

## 2024-07-18 ENCOUNTER — Ambulatory Visit: Admitting: Speech Pathology

## 2024-07-18 ENCOUNTER — Ambulatory Visit

## 2024-07-23 ENCOUNTER — Encounter: Admitting: Sleep Medicine

## 2024-07-23 ENCOUNTER — Ambulatory Visit

## 2024-07-23 ENCOUNTER — Ambulatory Visit: Admitting: Speech Pathology

## 2024-07-25 ENCOUNTER — Ambulatory Visit: Admitting: Speech Pathology

## 2024-07-25 ENCOUNTER — Ambulatory Visit

## 2024-07-30 ENCOUNTER — Ambulatory Visit

## 2024-07-30 ENCOUNTER — Ambulatory Visit: Admitting: Speech Pathology

## 2024-08-01 ENCOUNTER — Ambulatory Visit: Admitting: Speech Pathology

## 2024-08-01 ENCOUNTER — Ambulatory Visit

## 2024-08-06 ENCOUNTER — Ambulatory Visit: Admitting: Speech Pathology

## 2024-08-06 ENCOUNTER — Ambulatory Visit

## 2024-08-08 ENCOUNTER — Ambulatory Visit

## 2024-08-08 ENCOUNTER — Ambulatory Visit: Admitting: Speech Pathology

## 2024-08-13 ENCOUNTER — Ambulatory Visit: Admitting: Speech Pathology

## 2024-08-13 ENCOUNTER — Ambulatory Visit

## 2024-08-15 ENCOUNTER — Ambulatory Visit

## 2024-08-15 ENCOUNTER — Ambulatory Visit: Admitting: Speech Pathology

## 2024-08-20 ENCOUNTER — Ambulatory Visit: Admitting: Speech Pathology

## 2024-08-20 ENCOUNTER — Ambulatory Visit

## 2024-08-22 ENCOUNTER — Ambulatory Visit: Admitting: Speech Pathology

## 2024-08-22 ENCOUNTER — Ambulatory Visit

## 2024-08-27 ENCOUNTER — Ambulatory Visit: Admitting: Speech Pathology

## 2024-08-27 ENCOUNTER — Ambulatory Visit

## 2024-08-29 ENCOUNTER — Ambulatory Visit: Admitting: Speech Pathology

## 2024-08-29 ENCOUNTER — Ambulatory Visit

## 2024-11-11 ENCOUNTER — Ambulatory Visit (INDEPENDENT_AMBULATORY_CARE_PROVIDER_SITE_OTHER): Admitting: Vascular Surgery

## 2024-11-11 ENCOUNTER — Encounter (INDEPENDENT_AMBULATORY_CARE_PROVIDER_SITE_OTHER)

## 2025-04-10 ENCOUNTER — Other Ambulatory Visit

## 2025-04-16 ENCOUNTER — Other Ambulatory Visit

## 2025-04-18 ENCOUNTER — Ambulatory Visit

## 2025-04-18 ENCOUNTER — Encounter: Admitting: Family Medicine

## 2025-04-23 ENCOUNTER — Ambulatory Visit: Admitting: Family Medicine
